# Patient Record
Sex: Female | Born: 1940 | Race: Black or African American | Hispanic: No | Smoking: Former smoker
Health system: Southern US, Community
[De-identification: ages and names within clinical notes are randomized; demographics above are authoritative.]

## PROBLEM LIST (undated history)

## (undated) DIAGNOSIS — T7840XA Allergy, unspecified, initial encounter: Secondary | ICD-10-CM

## (undated) DIAGNOSIS — R0600 Dyspnea, unspecified: Secondary | ICD-10-CM

## (undated) DIAGNOSIS — J449 Chronic obstructive pulmonary disease, unspecified: Secondary | ICD-10-CM

## (undated) DIAGNOSIS — G4730 Sleep apnea, unspecified: Secondary | ICD-10-CM

## (undated) DIAGNOSIS — M1990 Unspecified osteoarthritis, unspecified site: Secondary | ICD-10-CM

## (undated) DIAGNOSIS — I779 Disorder of arteries and arterioles, unspecified: Secondary | ICD-10-CM

## (undated) DIAGNOSIS — S4990XA Unspecified injury of shoulder and upper arm, unspecified arm, initial encounter: Secondary | ICD-10-CM

## (undated) DIAGNOSIS — K219 Gastro-esophageal reflux disease without esophagitis: Secondary | ICD-10-CM

## (undated) DIAGNOSIS — E785 Hyperlipidemia, unspecified: Secondary | ICD-10-CM

## (undated) DIAGNOSIS — J45909 Unspecified asthma, uncomplicated: Secondary | ICD-10-CM

## (undated) DIAGNOSIS — Z9889 Other specified postprocedural states: Secondary | ICD-10-CM

## (undated) DIAGNOSIS — K449 Diaphragmatic hernia without obstruction or gangrene: Secondary | ICD-10-CM

## (undated) DIAGNOSIS — K222 Esophageal obstruction: Secondary | ICD-10-CM

## (undated) DIAGNOSIS — I219 Acute myocardial infarction, unspecified: Secondary | ICD-10-CM

## (undated) DIAGNOSIS — R519 Headache, unspecified: Secondary | ICD-10-CM

## (undated) DIAGNOSIS — D126 Benign neoplasm of colon, unspecified: Secondary | ICD-10-CM

## (undated) DIAGNOSIS — I10 Essential (primary) hypertension: Secondary | ICD-10-CM

## (undated) DIAGNOSIS — R112 Nausea with vomiting, unspecified: Secondary | ICD-10-CM

## (undated) DIAGNOSIS — I2510 Atherosclerotic heart disease of native coronary artery without angina pectoris: Secondary | ICD-10-CM

## (undated) DIAGNOSIS — F419 Anxiety disorder, unspecified: Secondary | ICD-10-CM

## (undated) DIAGNOSIS — I5032 Chronic diastolic (congestive) heart failure: Secondary | ICD-10-CM

## (undated) HISTORY — DX: Unspecified asthma, uncomplicated: J45.909

## (undated) HISTORY — PX: ABDOMINAL HYSTERECTOMY: SHX81

## (undated) HISTORY — DX: Chronic obstructive pulmonary disease, unspecified: J44.9

## (undated) HISTORY — PX: ROTATOR CUFF REPAIR: SHX139

## (undated) HISTORY — DX: Hyperlipidemia, unspecified: E78.5

## (undated) HISTORY — DX: Esophageal obstruction: K22.2

## (undated) HISTORY — PX: OTHER SURGICAL HISTORY: SHX169

## (undated) HISTORY — DX: Diaphragmatic hernia without obstruction or gangrene: K44.9

## (undated) HISTORY — DX: Acute myocardial infarction, unspecified: I21.9

## (undated) HISTORY — PX: ESOPHAGOGASTRODUODENOSCOPY: SHX1529

## (undated) HISTORY — PX: CORONARY ARTERY BYPASS GRAFT: SHX141

## (undated) HISTORY — DX: Essential (primary) hypertension: I10

## (undated) HISTORY — PX: COLONOSCOPY: SHX174

## (undated) HISTORY — DX: Disorder of arteries and arterioles, unspecified: I77.9

## (undated) HISTORY — PX: UPPER GASTROINTESTINAL ENDOSCOPY: SHX188

## (undated) HISTORY — DX: Benign neoplasm of colon, unspecified: D12.6

## (undated) HISTORY — PX: EYE SURGERY: SHX253

## (undated) HISTORY — PX: TONSILLECTOMY: SUR1361

## (undated) HISTORY — DX: Unspecified injury of shoulder and upper arm, unspecified arm, initial encounter: S49.90XA

## (undated) HISTORY — DX: Sleep apnea, unspecified: G47.30

## (undated) HISTORY — DX: Atherosclerotic heart disease of native coronary artery without angina pectoris: I25.10

## (undated) HISTORY — PX: POLYPECTOMY: SHX149

## (undated) HISTORY — PX: APPENDECTOMY: SHX54

## (undated) HISTORY — DX: Chronic diastolic (congestive) heart failure: I50.32

## (undated) HISTORY — DX: Allergy, unspecified, initial encounter: T78.40XA

---

## 1998-01-21 ENCOUNTER — Encounter: Admission: RE | Admit: 1998-01-21 | Discharge: 1998-04-21 | Payer: Self-pay | Admitting: Internal Medicine

## 1998-03-06 ENCOUNTER — Encounter: Payer: Self-pay | Admitting: Orthopedic Surgery

## 1998-03-11 ENCOUNTER — Observation Stay (HOSPITAL_COMMUNITY): Admission: RE | Admit: 1998-03-11 | Discharge: 1998-03-12 | Payer: Self-pay | Admitting: Orthopedic Surgery

## 1998-08-19 ENCOUNTER — Ambulatory Visit (HOSPITAL_COMMUNITY): Admission: RE | Admit: 1998-08-19 | Discharge: 1998-08-19 | Payer: Self-pay | Admitting: Gastroenterology

## 1998-08-19 ENCOUNTER — Encounter: Payer: Self-pay | Admitting: Gastroenterology

## 1998-10-06 ENCOUNTER — Encounter: Payer: Self-pay | Admitting: Internal Medicine

## 1998-10-07 ENCOUNTER — Inpatient Hospital Stay (HOSPITAL_COMMUNITY): Admission: AD | Admit: 1998-10-07 | Discharge: 1998-10-11 | Payer: Self-pay | Admitting: Internal Medicine

## 1998-10-10 ENCOUNTER — Encounter: Payer: Self-pay | Admitting: Cardiology

## 1999-01-11 ENCOUNTER — Emergency Department (HOSPITAL_COMMUNITY): Admission: EM | Admit: 1999-01-11 | Discharge: 1999-01-11 | Payer: Self-pay | Admitting: Emergency Medicine

## 1999-01-11 ENCOUNTER — Encounter: Payer: Self-pay | Admitting: Emergency Medicine

## 2000-04-12 ENCOUNTER — Encounter: Admission: RE | Admit: 2000-04-12 | Discharge: 2000-04-12 | Payer: Self-pay | Admitting: Gastroenterology

## 2000-04-12 ENCOUNTER — Encounter: Payer: Self-pay | Admitting: Gastroenterology

## 2000-05-04 ENCOUNTER — Ambulatory Visit (HOSPITAL_COMMUNITY): Admission: RE | Admit: 2000-05-04 | Discharge: 2000-05-04 | Payer: Self-pay | Admitting: Gastroenterology

## 2000-05-04 ENCOUNTER — Encounter (INDEPENDENT_AMBULATORY_CARE_PROVIDER_SITE_OTHER): Payer: Self-pay | Admitting: Specialist

## 2000-07-21 ENCOUNTER — Other Ambulatory Visit: Admission: RE | Admit: 2000-07-21 | Discharge: 2000-07-21 | Payer: Self-pay | Admitting: *Deleted

## 2000-08-15 ENCOUNTER — Encounter: Admission: RE | Admit: 2000-08-15 | Discharge: 2000-08-15 | Payer: Self-pay | Admitting: Gastroenterology

## 2000-08-15 ENCOUNTER — Encounter: Payer: Self-pay | Admitting: Gastroenterology

## 2001-01-11 ENCOUNTER — Encounter: Payer: Self-pay | Admitting: Gastroenterology

## 2001-01-11 ENCOUNTER — Encounter: Admission: RE | Admit: 2001-01-11 | Discharge: 2001-01-11 | Payer: Self-pay | Admitting: Gastroenterology

## 2001-03-06 ENCOUNTER — Encounter (INDEPENDENT_AMBULATORY_CARE_PROVIDER_SITE_OTHER): Payer: Self-pay | Admitting: *Deleted

## 2001-03-06 ENCOUNTER — Ambulatory Visit (HOSPITAL_COMMUNITY): Admission: RE | Admit: 2001-03-06 | Discharge: 2001-03-06 | Payer: Self-pay | Admitting: Gastroenterology

## 2001-03-08 ENCOUNTER — Encounter: Payer: Self-pay | Admitting: Urology

## 2001-03-08 ENCOUNTER — Ambulatory Visit: Admission: RE | Admit: 2001-03-08 | Discharge: 2001-03-08 | Payer: Self-pay | Admitting: Urology

## 2001-03-29 ENCOUNTER — Encounter (INDEPENDENT_AMBULATORY_CARE_PROVIDER_SITE_OTHER): Payer: Self-pay | Admitting: Specialist

## 2001-03-29 ENCOUNTER — Inpatient Hospital Stay (HOSPITAL_COMMUNITY): Admission: RE | Admit: 2001-03-29 | Discharge: 2001-04-02 | Payer: Self-pay | Admitting: Urology

## 2002-03-13 ENCOUNTER — Ambulatory Visit (HOSPITAL_BASED_OUTPATIENT_CLINIC_OR_DEPARTMENT_OTHER): Admission: RE | Admit: 2002-03-13 | Discharge: 2002-03-13 | Payer: Self-pay | Admitting: Oral Surgery

## 2003-08-03 ENCOUNTER — Inpatient Hospital Stay (HOSPITAL_COMMUNITY): Admission: RE | Admit: 2003-08-03 | Discharge: 2003-08-04 | Payer: Self-pay | Admitting: Orthopedic Surgery

## 2004-05-28 ENCOUNTER — Emergency Department (HOSPITAL_COMMUNITY): Admission: EM | Admit: 2004-05-28 | Discharge: 2004-05-28 | Payer: Self-pay | Admitting: Emergency Medicine

## 2004-08-12 ENCOUNTER — Encounter: Admission: RE | Admit: 2004-08-12 | Discharge: 2004-08-12 | Payer: Self-pay | Admitting: Internal Medicine

## 2005-07-21 ENCOUNTER — Encounter: Admission: RE | Admit: 2005-07-21 | Discharge: 2005-07-21 | Payer: Self-pay | Admitting: Orthopedic Surgery

## 2005-08-06 ENCOUNTER — Encounter: Admission: RE | Admit: 2005-08-06 | Discharge: 2005-08-06 | Payer: Self-pay | Admitting: Orthopedic Surgery

## 2006-03-21 ENCOUNTER — Ambulatory Visit (HOSPITAL_COMMUNITY): Admission: RE | Admit: 2006-03-21 | Discharge: 2006-03-21 | Payer: Self-pay | Admitting: Neurosurgery

## 2006-06-01 ENCOUNTER — Encounter: Admission: RE | Admit: 2006-06-01 | Discharge: 2006-06-01 | Payer: Self-pay | Admitting: Orthopedic Surgery

## 2006-08-19 ENCOUNTER — Ambulatory Visit: Payer: Self-pay | Admitting: Vascular Surgery

## 2007-02-09 DIAGNOSIS — I219 Acute myocardial infarction, unspecified: Secondary | ICD-10-CM

## 2007-02-09 DIAGNOSIS — I252 Old myocardial infarction: Secondary | ICD-10-CM

## 2007-02-09 HISTORY — DX: Acute myocardial infarction, unspecified: I21.9

## 2007-02-09 HISTORY — DX: Old myocardial infarction: I25.2

## 2007-03-01 ENCOUNTER — Inpatient Hospital Stay (HOSPITAL_COMMUNITY): Admission: EM | Admit: 2007-03-01 | Discharge: 2007-03-12 | Payer: Self-pay | Admitting: Emergency Medicine

## 2007-03-01 HISTORY — PX: CARDIAC CATHETERIZATION: SHX172

## 2007-03-02 ENCOUNTER — Encounter: Payer: Self-pay | Admitting: Surgery

## 2007-03-03 ENCOUNTER — Ambulatory Visit: Payer: Self-pay | Admitting: Vascular Surgery

## 2007-03-03 ENCOUNTER — Encounter: Payer: Self-pay | Admitting: Surgery

## 2007-03-06 ENCOUNTER — Ambulatory Visit: Payer: Self-pay | Admitting: Surgery

## 2007-03-07 ENCOUNTER — Encounter: Payer: Self-pay | Admitting: Surgery

## 2007-03-28 ENCOUNTER — Ambulatory Visit: Payer: Self-pay | Admitting: Surgery

## 2007-03-28 ENCOUNTER — Encounter: Admission: RE | Admit: 2007-03-28 | Discharge: 2007-03-28 | Payer: Self-pay | Admitting: Surgery

## 2007-04-03 ENCOUNTER — Encounter (HOSPITAL_COMMUNITY): Admission: RE | Admit: 2007-04-03 | Discharge: 2007-07-02 | Payer: Self-pay | Admitting: Cardiovascular Disease

## 2007-07-03 ENCOUNTER — Encounter (HOSPITAL_COMMUNITY): Admission: RE | Admit: 2007-07-03 | Discharge: 2007-08-09 | Payer: Self-pay | Admitting: Cardiovascular Disease

## 2007-11-15 HISTORY — PX: CARDIOVASCULAR STRESS TEST: SHX262

## 2008-03-08 HISTORY — PX: US ECHOCARDIOGRAPHY: HXRAD669

## 2008-10-11 ENCOUNTER — Ambulatory Visit (HOSPITAL_COMMUNITY): Admission: RE | Admit: 2008-10-11 | Discharge: 2008-10-11 | Payer: Self-pay | Admitting: Cardiovascular Disease

## 2008-10-11 ENCOUNTER — Inpatient Hospital Stay (HOSPITAL_BASED_OUTPATIENT_CLINIC_OR_DEPARTMENT_OTHER): Admission: RE | Admit: 2008-10-11 | Discharge: 2008-10-11 | Payer: Self-pay | Admitting: Cardiovascular Disease

## 2008-10-11 HISTORY — PX: CARDIAC CATHETERIZATION: SHX172

## 2009-07-23 ENCOUNTER — Ambulatory Visit (HOSPITAL_COMMUNITY): Admission: RE | Admit: 2009-07-23 | Discharge: 2009-07-23 | Payer: Self-pay | Admitting: Cardiovascular Disease

## 2009-07-23 HISTORY — PX: CARDIAC CATHETERIZATION: SHX172

## 2009-08-08 ENCOUNTER — Encounter: Admission: RE | Admit: 2009-08-08 | Discharge: 2009-08-08 | Payer: Self-pay | Admitting: Internal Medicine

## 2009-11-18 DIAGNOSIS — E119 Type 2 diabetes mellitus without complications: Secondary | ICD-10-CM | POA: Insufficient documentation

## 2009-11-18 DIAGNOSIS — E785 Hyperlipidemia, unspecified: Secondary | ICD-10-CM | POA: Insufficient documentation

## 2009-11-19 ENCOUNTER — Ambulatory Visit: Payer: Self-pay | Admitting: Critical Care Medicine

## 2009-11-19 DIAGNOSIS — I252 Old myocardial infarction: Secondary | ICD-10-CM | POA: Insufficient documentation

## 2009-11-19 DIAGNOSIS — J45909 Unspecified asthma, uncomplicated: Secondary | ICD-10-CM | POA: Insufficient documentation

## 2009-11-19 DIAGNOSIS — R05 Cough: Secondary | ICD-10-CM

## 2009-11-19 DIAGNOSIS — K219 Gastro-esophageal reflux disease without esophagitis: Secondary | ICD-10-CM | POA: Insufficient documentation

## 2009-11-19 DIAGNOSIS — R059 Cough, unspecified: Secondary | ICD-10-CM | POA: Insufficient documentation

## 2009-11-20 ENCOUNTER — Encounter: Payer: Self-pay | Admitting: Critical Care Medicine

## 2009-12-29 ENCOUNTER — Ambulatory Visit: Payer: Self-pay | Admitting: Critical Care Medicine

## 2009-12-29 DIAGNOSIS — R252 Cramp and spasm: Secondary | ICD-10-CM | POA: Insufficient documentation

## 2009-12-30 ENCOUNTER — Telehealth: Payer: Self-pay | Admitting: Critical Care Medicine

## 2009-12-30 LAB — CONVERTED CEMR LAB
CO2: 30 meq/L (ref 19–32)
Calcium: 9 mg/dL (ref 8.4–10.5)
Creatinine, Ser: 1 mg/dL (ref 0.4–1.2)
Sodium: 139 meq/L (ref 135–145)

## 2010-02-25 ENCOUNTER — Ambulatory Visit: Payer: Self-pay | Admitting: Cardiovascular Disease

## 2010-03-01 ENCOUNTER — Encounter: Payer: Self-pay | Admitting: Neurosurgery

## 2010-03-12 NOTE — Assessment & Plan Note (Signed)
Summary: Pulmonary OV   Copy to:  Dr. Andi Devon Primary Oral Remache/Referring Kaiana Marion:  Dr. Andi Devon  CC:  1 month follow up.  Breathing unchanged.  Cough has improved but still has a nonprod cough..  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  Dyspnea occures with rest and exertion. This came on insideously and now is  getting progressively worse.  Pt notes walking from car to parking lot is a struggle, worse up an incline.  Pt will notes some dyspnea when awaken from sleep Pt notes some coughing this week withdx of  bronchitis  with  mucus white to sl yellow Pt rx with advair and proair and ?s if helps   There is no real chest pain, did starte with chest pain and saw cardiology and tests negNovember 21, 2011 4:42 PM at last ov ? cyclic cough we Rec: Use Cyclic cough protocol with tramadol and benzonatate Stop advair Stop lisinopril Start Losartan one daily Start omeprazole one daily Start Qvar two puff twice daily  now:  cough is better vs before.  Still present, not as severe.  always dry cough.  nauseated with omeprazole cough pills helped esp at night. The pt still using strong perfume   Current Medications (verified): 1)  Robaxin 500 Mg Tabs (Methocarbamol) .... Take 1 Tablet By Mouth Two Times A Day As Needed 2)  Qvar 80 Mcg/act Aers (Beclomethasone Dipropionate) .... Two Puff Twice Daily 3)  Duoneb 0.5-2.5 (3) Mg/77ml Soln (Ipratropium-Albuterol) .... Four Times Daily 4)  Amlodipine Besylate 5 Mg Tabs (Amlodipine Besylate) .Marland Kitchen.. 1 Once Daily 5)  Metformin Hcl 500 Mg Tabs (Metformin Hcl) .Marland Kitchen.. 1 Two Times A Day 6)  Xanax 0.5 Mg Tabs (Alprazolam) .... Take 1 Tablet By Mouth Two Times A Day 7)  Losartan Potassium 100 Mg Tabs (Losartan Potassium) .... One By Mouth Daily 8)  Crestor 20 Mg Tabs (Rosuvastatin Calcium) .... Take One Tablet By Mouth Every Other Day 9)  Aspirin  81 Mg Tbec (Aspirin) .Marland Kitchen.. 1 Once Daily 10)  Humalog Mix 75/25 75-25 % Susp (Insulin Lispro Prot & Lispro) .... 20 Units in Am and 40 Units in Pm 11)  Benzonatate 100 Mg Caps (Benzonatate) .... One To Two Every 4-6 Hours As Needed Cough 12)  Bystolic 10 Mg Tabs (Nebivolol Hcl) .... Take 1 Tablet By Mouth Once A Day 13)  Doxazosin Mesylate 4 Mg Tabs (Doxazosin Mesylate) .... 1/2 Tablet Two Times A Day 14)  Potassium 99 Mg Tabs (Potassium) .... Take 1 Tablet By Mouth Once A Day 15)  Vitamin D3 1000 Unit Caps (Cholecalciferol) .... Take 1 Capsule By Mouth Once A Day 16)  Omeprazole 20 Mg  Cpdr (Omeprazole) .... By Mouth Daily. Take One Half Hour Before Eating. 17)  Tramadol Hcl 50 Mg  Tabs (Tramadol Hcl) .... One To Two By Mouth Every 4-6 Hours As Needed Cough 18)  Aerochamber Plus  Misc (Spacer/aero-Holding Chambers) .... Use With Qvar  Allergies (verified): 1)  ! Sulfa 2)  ! Codeine 3)  ! Morphine  Past History:  Past medical, surgical, family and social histories (including risk factors) reviewed, and no changes noted (except as noted below).  Past Medical History: Reviewed history from 11/19/2009 and no changes required. HEART ATTACK (ICD-410.90) ASTHMA (ICD-493.90) HYPERLIPIDEMIA (ICD-272.4) DIABETES, TYPE 2 (ICD-250.00)  Past Surgical History: Reviewed history from 11/19/2009 and no changes required. 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 emphysema - paternal uncle heart disase - MGF esophagus cancer - brother  Social History: Reviewed history from 11/19/2009 and no changes required. Quit smoking in 1981.  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  The patient denies shortness of breath at  rest, productive cough, coughing up blood, chest pain, irregular heartbeats, acid heartburn, indigestion, loss of appetite, weight change, abdominal pain, difficulty swallowing, sore throat, tooth/dental problems, headaches, nasal congestion/difficulty breathing through nose, sneezing, itching, ear ache, anxiety, depression, hand/feet swelling, joint stiffness or pain, rash, change in color of mucus, and fever.    Vital Signs:  Patient profile:   70 year old female Height:      64 inches Weight:      230.13 pounds BMI:     39.64 O2 Sat:      91 % on Room air Temp:     97.7 degrees F oral Pulse rate:   77 / minute BP sitting:   104 / 64  (right arm) Cuff size:   large  Vitals Entered By: Gweneth Dimitri RN (December 29, 2009 4:13 PM)  Nutrition Counseling: Patient's BMI is greater than 25 and therefore counseled on weight management options.  O2 Flow:  Room air CC: 1 month follow up.  Breathing unchanged.  Cough has improved but still has a nonprod cough. Comments Medications reviewed with patient Daytime contact number verified with patient. Gweneth Dimitri RN  December 29, 2009 4:13 PM    Physical Exam  Additional Exam:  Gen: Pleasant, well-nourished, in no distress,  normal affect ENT: No lesions,  mouth clear,  oropharynx clear, no postnasal drip Neck: No JVD, no TMG, no carotid bruits Lungs: No use of accessory muscles, no dullness to percussion, distant BS, prominent pseudowheeze Cardiovascular: RRR, heart sounds normal, no murmur or gallops, no peripheral edema Abdomen: soft and NT, no HSM,  BS normal Musculoskeletal: No deformities, no cyanosis or clubbing Neuro: alert, non focal Skin: Warm, no lesions or rashes    Impression & Recommendations:  Problem # 1:  COUGH (ICD-786.2) Assessment Improved  Orders: Est. Patient Level III (69629)  Cyclical cough with upper airway instability, GERD, probable lower airway inflammation as well.  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 Four times daily 2)  Duoneb 0.5-2.5 (3) Mg/65ml Soln (Ipratropium-albuterol) .... Four times daily  as needed 3)  Famotidine 20 Mg Tabs (Famotidine) .... One by mouth once daily  Complete Medication List: 1)  Robaxin 500 Mg Tabs (Methocarbamol) .... Take 1 tablet by mouth two times a day as needed 2)  Qvar 80 Mcg/act Aers (Beclomethasone dipropionate) .... Two puff twice daily 3)  Duoneb 0.5-2.5 (3) Mg/57ml Soln (Ipratropium-albuterol) .... Four times daily  as needed 4)  Amlodipine Besylate 5 Mg Tabs (Amlodipine besylate) .Marland Kitchen.. 1 once daily 5)  Metformin Hcl 500 Mg Tabs (Metformin hcl) .Marland Kitchen.. 1 two times a day 6)  Xanax 0.5 Mg Tabs (Alprazolam) .... Take 1 tablet  by mouth two times a day 7)  Losartan Potassium 100 Mg Tabs (Losartan potassium) .... One by mouth daily 8)  Crestor 20 Mg Tabs (Rosuvastatin calcium) .... Take one tablet by mouth every other day 9)  Aspirin 81 Mg Tbec (Aspirin) .Marland Kitchen.. 1 once daily 10)  Humalog Mix 75/25 75-25 % Susp (Insulin lispro prot & lispro) .... 20 units in am and 40 units in pm 11)  Benzonatate 100 Mg Caps (Benzonatate) .... One to two every 4-6 hours as needed cough 12)  Bystolic 10 Mg Tabs (Nebivolol hcl) .... Take 1 tablet by mouth once a day 13)  Doxazosin Mesylate 4 Mg Tabs (Doxazosin mesylate) .... 1/2 tablet two times a day 14)  Potassium 99 Mg Tabs (Potassium) .... Take 1 tablet by mouth once a day 15)  Vitamin D3 1000 Unit Caps (Cholecalciferol) .... Take 1 capsule by mouth once a day 16)  Famotidine 20 Mg Tabs (Famotidine) .... One by mouth once daily 17)  Tramadol Hcl 50 Mg Tabs (Tramadol hcl) .... One to two by mouth every 4-6 hours as needed cough 18)  Aerochamber Plus  Misc (Spacer/aero-holding chambers) .... Use with qvar  Other Orders: TLB-Calcium (82310-CA) TLB-BMP (Basic Metabolic Panel-BMET) (80048-METABOL) TLB-Phosphorus (84100-PHOS) TLB-Magnesium (Mg) (83735-MG)  Patient Instructions: 1)  Stop omeprazole 2)  Start famotidine daily for stomach 3)  Try to reduce the amount of perfume you are using 4)  No change in Qvar 5)  Use tramadol or benzonatate as needed for cough 6)  Follow reflux diet 7)  Labs today to check for cause of cramping 8)  Return 3 months Prescriptions: FAMOTIDINE 20 MG TABS (FAMOTIDINE) one by mouth once daily  #30 x 6   Entered and Authorized by:   Storm Frisk MD   Signed by:   Storm Frisk MD on 12/29/2009   Method used:   Electronically to        [facility]. [phone]* (retail)       [address] [place]  [address]       Ph: [phone] or [phone]       Fax: [phone]   RxID:   [id number]     Appended Document: Pulmonary OV fax Kellie Shropshire

## 2010-03-12 NOTE — Progress Notes (Signed)
Summary: Labs normal  Phone Note Outgoing Call   Reason for Call: Discuss lab or test results Summary of Call: call the pt and tell her all labs are normal, i am unclear why she is cramping, she should confer with her primary care MD as this is not related to her cough or her pulm issues Initial call taken by: Storm Frisk MD,  December 30, 2009 6:06 AM  Follow-up for Phone Call        Called, spoke with pt.  She was informed of above results and recs per PW and verbalized understanding.   Follow-up by: Gweneth Dimitri RN,  December 30, 2009 8:49 AM

## 2010-03-12 NOTE — Assessment & Plan Note (Signed)
Summary: Pulmonary Consultation   Copy to:  Dr. Andi Devon Primary Claudie Brickhouse/Referring Katiana Ruland:  Dr. Andi Devon  CC:  Pulmonary Consult for SOB.   and dyspnea.  History of Present Illness: Pulmonary Consultation       This is a 70 year old female who presents with dyspnea.  The patient complains of shortness of breath, chest tightness, wheezing, cough, mucous production, exercise induced symptoms, and congestion, but denies history of diagnosed COPD, chest pain worse with breathing and coughing, and nocturnal awakening.  The cough is described as productive of clear sputum, perceived to be from lower airway, not associated with chest pain, interrupts sleep, worse with cold air, occurs after eating, worse with exercise, worse with exposure to inhalants:, worse in supine position, worse with stress/anxiety, and with wheezing noted.  The dyspnea is described as dyspnea at rest, dyspnea with exertion, occurs while bending over, and associated with wheezing.  The patient reports a history of asthma, sleep disordered breathing, obstructive sleep apnea, and heart disease.  The patient denies any history of allergic rhinitis, COPD, thyroid disease, anemia, collagen vascular disease, osteporosis, kyphoscoliosis, occupational exposure: , and cancer: .  Prior effective therapies include beta agonists and oral inhaled steroids.    The pt has had dyspnea for one year.  Dyspnea occures with rest and exertion. This came on insideously and now is  getting progressively worse.  Pt notes walking from car to parking lot is a struggle, worse up an incline.  Pt will notes some dyspnea when awaken from sleep Pt notes some coughing this week withdx of  bronchitis  with  mucus white to sl yellow Pt rx with advair and proair and ?s if helps   There is no real chest pain, did starte with chest pain and saw cardiology and tests neg  Preventive Screening-Counseling & Management  Alcohol-Tobacco     Smoking  Status: quit > 6 months     Year Started: 1985     Pack years: 7  Current Medications (verified): 1)  Robaxin 500 Mg Tabs (Methocarbamol) .... Take 1 Tablet By Mouth Two Times A Day As Needed 2)  Advair Diskus 500-50 Mcg/dose Aepb (Fluticasone-Salmeterol) .... Inhale 1 Puff Two Times A Day 3)  Duoneb 0.5-2.5 (3) Mg/33ml Soln (Ipratropium-Albuterol) .... Use As Directed 4)  Amlodipine Besylate 5 Mg Tabs (Amlodipine Besylate) .Marland Kitchen.. 1 Once Daily 5)  Metformin Hcl 500 Mg Tabs (Metformin Hcl) .Marland Kitchen.. 1 Two Times A Day 6)  Xanax 0.5 Mg Tabs (Alprazolam) .... Take 1 Tablet By Mouth Two Times A Day 7)  Lisinopril 20 Mg Tabs (Lisinopril) .... 2 Once Daily 8)  Crestor 20 Mg Tabs (Rosuvastatin Calcium) .... Take One Tablet By Mouth Every Other Day 9)  Aspirin 81 Mg Tbec (Aspirin) .Marland Kitchen.. 1 Once Daily 10)  Humalog Mix 75/25 75-25 % Susp (Insulin Lispro Prot & Lispro) .... 20 Units in Am and 40 Units in Pm 11)  Benzonatate 100 Mg Caps (Benzonatate) .... As Needed 12)  Bystolic 10 Mg Tabs (Nebivolol Hcl) .... Take 1 Tablet By Mouth Once A Day 13)  Doxazosin Mesylate 4 Mg Tabs (Doxazosin Mesylate) .... 1/2 Tablet Two Times A Day 14)  Potassium 99 Mg Tabs (Potassium) .... Take 1 Tablet By Mouth Once A Day 15)  Vitamin D3 1000 Unit Caps (Cholecalciferol) .... Take 1 Capsule By Mouth Once A Day  Allergies: 1)  ! Sulfa 2)  ! Codeine 3)  ! Morphine  Past History:  Past medical, surgical, family and  social histories (including risk factors) reviewed, and no changes noted (except as noted below).  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 emphysema - paternal uncle heart disase - MGF esophagus cancer - brother  Social History: Reviewed  history and no changes required. Quit smoking in 1981.  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  The patient denies coughing up blood, chest pain, irregular heartbeats, acid heartburn, indigestion, loss of appetite, weight change, abdominal pain, sore throat, tooth/dental problems, headaches, sneezing, itching, ear ache, anxiety, depression, joint stiffness or pain, rash, and fever.         Notes hoarseness      See HPI for Pulmonary, Cardiac, ENT, and General review of systems.  Vital Signs:  Patient profile:   70 year old female Height:      64 inches Weight:      228.13 pounds BMI:     39.30 O2 Sat:      95 % on Room air Temp:     98.2 degrees F oral Pulse rate:   81 / minute BP sitting:   100 / 64  (left arm) Cuff size:   large  Vitals Entered By: Gweneth Dimitri RN (November 19, 2009 2:29 PM)  O2 Flow:  Room air CC: Pulmonary Consult for SOB.  , dyspnea Comments Medications reviewed with patient Daytime contact number verified with patient. Gweneth Dimitri RN  November 19, 2009 2:30 PM    Physical Exam  Additional Exam:  Gen: Pleasant, well-nourished, in no distress,  normal affect ENT: No lesions,  mouth clear,  oropharynx clear, no postnasal drip Neck: No JVD, no TMG, no carotid bruits Lungs: No use of accessory muscles, no dullness to percussion, distant BS, prominent pseudowheeze Cardiovascular: RRR, heart sounds normal, no murmur or gallops, no peripheral edema Abdomen: soft and NT, no HSM,  BS normal Musculoskeletal: No deformities, no cyanosis or clubbing Neuro: alert, non focal Skin: Warm, no lesions or  rashes    Impression & Recommendations:  Problem # 1:  COUGH (ICD-786.2) Assessment Deteriorated Cyclical cough with upper airway instability, GERD, probable lower airway inflammation as well.  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 Take 1 tablet by mouth two times a day as needed 2)  Advair Diskus 500-50 Mcg/dose Aepb (Fluticasone-salmeterol) .... Inhale 1 puff two times a day 3)  Qvar 80 Mcg/act Aers (Beclomethasone dipropionate) .... Two puff twice daily 4)  Xanax 0.5 Mg Tabs (Alprazolam) .... Take 1 tablet by mouth two times a day 5)  Lisinopril 20 Mg Tabs (Lisinopril) .... 2 once daily 6)  Losartan Potassium 100 Mg Tabs (Losartan potassium) .... One by mouth daily 7)  Crestor 20 Mg Tabs (  Rosuvastatin calcium) .... Take one tablet by mouth every other day 8)  Humalog Mix 75/25 75-25 % Susp (Insulin lispro prot & lispro) .... 20 units in am and 40 units in pm 9)  Benzonatate 100 Mg Caps (Benzonatate) .... As needed 10)  Benzonatate 100 Mg Caps (Benzonatate) .... One to two every 4-6 hours as needed cough 11)  Bystolic 10 Mg Tabs (Nebivolol hcl) .... Take 1 tablet by mouth once a day 12)  Doxazosin Mesylate 4 Mg Tabs (Doxazosin mesylate) .... 1/2 tablet two times a day 13)  Potassium 99 Mg Tabs (Potassium) .... Take 1 tablet by mouth once a day 14)  Vitamin D3 1000 Unit Caps (Cholecalciferol) .... Take 1 capsule by mouth once a day 15)  Omeprazole 20 Mg Cpdr (Omeprazole) .... By mouth daily. take one half hour before eating. 16)  Tussicaps 10-8 Mg Xr12h-cap (Hydrocod polst-chlorphen polst) .... One by mouth two times a day as needed cough 17)  Tramadol Hcl 50 Mg Tabs (Tramadol hcl) .... One to two by mouth every  4-6 hours as needed cough 18)  Aerochamber Plus Misc (Spacer/aero-holding chambers) .... Use with qvar  Complete Medication List: 1)  Robaxin 500 Mg Tabs (Methocarbamol) .... Take 1 tablet by mouth two times a day as needed 2)  Qvar 80 Mcg/act Aers (Beclomethasone dipropionate) .... Two puff twice daily 3)  Duoneb 0.5-2.5 (3) Mg/52ml Soln (Ipratropium-albuterol) .... Use as directed 4)  Amlodipine Besylate 5 Mg Tabs (Amlodipine besylate) .Marland Kitchen.. 1 once daily 5)  Metformin Hcl 500 Mg Tabs (Metformin hcl) .Marland Kitchen.. 1 two times a day 6)  Xanax 0.5 Mg Tabs (Alprazolam) .... Take 1 tablet by mouth two times a day 7)  Losartan Potassium 100 Mg Tabs (Losartan potassium) .... One by mouth daily 8)  Crestor 20 Mg Tabs (Rosuvastatin calcium) .... Take one tablet by mouth every other day 9)  Aspirin 81 Mg Tbec (Aspirin) .Marland Kitchen.. 1 once daily 10)  Humalog Mix 75/25 75-25 % Susp (Insulin lispro prot & lispro) .... 20 units in am and 40 units in pm 11)  Benzonatate 100 Mg Caps (Benzonatate) .... One to two every 4-6 hours as needed cough 12)  Bystolic 10 Mg Tabs (Nebivolol hcl) .... Take 1 tablet by mouth once a day 13)  Doxazosin Mesylate 4 Mg Tabs (Doxazosin mesylate) .... 1/2 tablet two times a day 14)  Potassium 99 Mg Tabs (Potassium) .... Take 1 tablet by mouth once a day 15)  Vitamin D3 1000 Unit Caps (Cholecalciferol) .... Take 1 capsule by mouth once a day 16)  Omeprazole 20 Mg Cpdr (Omeprazole) .... By mouth daily. take one half hour before eating. 17)  Tramadol Hcl 50 Mg Tabs (Tramadol hcl) .... One to two by mouth every 4-6 hours as needed cough 18)  Aerochamber Plus Misc (Spacer/aero-holding chambers) .... Use with qvar  Patient Instructions: 1)  Use Cyclic cough protocol with tramadol and benzonatate 2)  Stop advair 3)  Stop lisinopril 4)  Start Losartan one daily 5)  Start omeprazole one daily 6)  Start Qvar two puff twice daily 7)  Use aerochamber with Qvar 8)  Return one month   Prescriptions: AEROCHAMBER PLUS  MISC (SPACER/AERO-HOLDING CHAMBERS) Use with Qvar  #1 x 0   Entered and Authorized by:   Storm Frisk MD   Signed by:   Storm Frisk MD on 11/19/2009   Method used:   Print then Give to Patient   RxID:   [id number] TRAMADOL HCL 50 MG  TABS (TRAMADOL HCL) One to two by mouth every 4-6 hours as needed cough  #40 x 1   Entered and Authorized by:   Storm Frisk MD   Signed by:   Storm Frisk MD on 11/19/2009   Method used:   Print then Give to Patient   RxID:   [id number] BENZONATATE 100 MG CAPS (BENZONATATE) One to two every 4-6 hours as needed cough  #90 x 6   Entered and Authorized by:   Storm Frisk MD   Signed by:   Storm Frisk MD on 11/19/2009   Method used:   Print then Give to Patient   RxID:   [id number] TUSSICAPS 10-8 MG XR12H-CAP (HYDROCOD POLST-CHLORPHEN POLST) One by mouth two times a day as needed cough  #10 x 0   Entered and Authorized by:   Storm Frisk MD   Signed by:   Storm Frisk MD on 11/19/2009   Method used:   Print then Give to Patient   RxID:   [id number] OMEPRAZOLE 20 MG  CPDR (OMEPRAZOLE) By mouth daily. Take one half hour before eating.  #30 x 0   Entered and Authorized by:   Storm Frisk MD   Signed by:   Storm Frisk MD on 11/19/2009   Method used:   [facility]        [facility] [phone][facility] (retail)       [address], [place]  [address]       Ph: [phone] or [phone]       Fax: [phone]   RxID:   [id number] DDUK 02 MCG/ACT AERS (BECLOMETHASONE DIPROPIONATE) Two puff twice daily  #1 x 6   Entered and Authorized by:   Storm Frisk MD   Signed by:   Storm Frisk MD on 11/19/2009   Method used:   Print then Give to Patient   RxID:   [id number] VOHYWVPX POTASSIUM 100 MG TABS (LOSARTAN POTASSIUM) One by mouth daily  #30 x 6   Entered and Authorized by:   Storm Frisk MD   Signed by:   Storm Frisk MD  on 11/19/2009   Method used:   [facility] to        [facility] [phone]* (retail)       [address], [place]  [address]       Ph: [phone] or [phone]       Fax: [phone]   RxID:   [id number]    Immunization History:  Influenza Immunization History:    Influenza:  historical (10/09/2008)   Appended Document: Pulmonary Consultation fax kim shelton

## 2010-03-12 NOTE — Miscellaneous (Signed)
Summary: Spirometry [facility] 07/2009  Clinical Lists Changes  Observations: Added new observation of PFT COMMENTS: severe obstruction (11/20/2009 16:39) Added new observation of FEF % EXPEC: 21 % (11/20/2009 16:39) Added new observation of FEF25-75%PRE: 1.89 L/min (11/20/2009 16:39) Added new observation of FEF 25-75%: 0.40 L/min (11/20/2009 16:39) Added new observation of FEV1/FVC PRE: 77 % (11/20/2009 16:39) Added new observation of FEV1/FVC: 58 % (11/20/2009 16:39) Added new observation of FEV1 % EXP: 49 % (11/20/2009 16:39) Added new observation of FEV1 PREDICT: 1.72 L (11/20/2009 16:39) Added new observation of FEV1: 0.84 L (11/20/2009 16:39) Added new observation of FVC % EXPECT: 65 % (11/20/2009 16:39) Added new observation of FVC PREDICT: 2.24 L (11/20/2009 16:39) Added new observation of FVC: 1.45 L (11/20/2009 16:39) Added new observation of PFT DATE: 08/01/2009  (11/20/2009 16:39)        Pulmonary Function Test Date: 08/01/2009 Height (in.): 64 Gender: Female  Pre-Spirometry FVC    Value: 1.45 L/min   Pred: 2.24 L/min     % Pred: 65 % FEV1    Value: 0.84 L     Pred: 1.72 L     % Pred: 49 % FEV1/FVC  Value: 58 %     Pred: 77 %    FEF 25-75  Value: 0.40 L/min   Pred: 1.89 L/min     % Pred: 21 %  Comments: severe obstruction

## 2010-04-08 ENCOUNTER — Ambulatory Visit (HOSPITAL_COMMUNITY)
Admission: RE | Admit: 2010-04-08 | Discharge: 2010-04-08 | Disposition: A | Discharge status: Home / Self Care | Payer: Medicare Other | Source: Ambulatory Visit | Attending: Orthopedic Surgery | Admitting: Orthopedic Surgery

## 2010-04-08 ENCOUNTER — Other Ambulatory Visit (HOSPITAL_COMMUNITY): Payer: Self-pay | Admitting: Orthopedic Surgery

## 2010-04-08 ENCOUNTER — Encounter (HOSPITAL_COMMUNITY)
Admission: RE | Admit: 2010-04-08 | Discharge: 2010-04-08 | Disposition: A | Discharge status: Home / Self Care | Payer: Medicare Other | Source: Ambulatory Visit | Attending: Orthopedic Surgery | Admitting: Orthopedic Surgery

## 2010-04-08 DIAGNOSIS — R0602 Shortness of breath: Secondary | ICD-10-CM | POA: Insufficient documentation

## 2010-04-08 DIAGNOSIS — E119 Type 2 diabetes mellitus without complications: Secondary | ICD-10-CM | POA: Insufficient documentation

## 2010-04-08 DIAGNOSIS — Z01818 Encounter for other preprocedural examination: Secondary | ICD-10-CM | POA: Insufficient documentation

## 2010-04-08 DIAGNOSIS — Z01810 Encounter for preprocedural cardiovascular examination: Secondary | ICD-10-CM | POA: Insufficient documentation

## 2010-04-08 DIAGNOSIS — M19011 Primary osteoarthritis, right shoulder: Secondary | ICD-10-CM

## 2010-04-08 DIAGNOSIS — Z01812 Encounter for preprocedural laboratory examination: Secondary | ICD-10-CM | POA: Insufficient documentation

## 2010-04-08 DIAGNOSIS — M19019 Primary osteoarthritis, unspecified shoulder: Secondary | ICD-10-CM | POA: Insufficient documentation

## 2010-04-08 DIAGNOSIS — J45909 Unspecified asthma, uncomplicated: Secondary | ICD-10-CM | POA: Insufficient documentation

## 2010-04-08 DIAGNOSIS — I10 Essential (primary) hypertension: Secondary | ICD-10-CM | POA: Insufficient documentation

## 2010-04-08 LAB — CBC
HCT: 31.3 % — ABNORMAL LOW (ref 36.0–46.0)
MCH: 24 pg — ABNORMAL LOW (ref 26.0–34.0)
MCHC: 31 g/dL (ref 30.0–36.0)
MCV: 77.3 fL — ABNORMAL LOW (ref 78.0–100.0)
Platelets: 297 10*3/uL (ref 150–400)
RBC: 4.05 MIL/uL (ref 3.87–5.11)
RDW: 17.5 % — ABNORMAL HIGH (ref 11.5–15.5)
WBC: 7.9 10*3/uL (ref 4.0–10.5)

## 2010-04-08 LAB — DIFFERENTIAL
Eosinophils Absolute: 0.2 10*3/uL (ref 0.0–0.7)
Lymphocytes Relative: 32 % (ref 12–46)
Monocytes Absolute: 0.4 10*3/uL (ref 0.1–1.0)

## 2010-04-08 LAB — URINALYSIS, ROUTINE W REFLEX MICROSCOPIC
Hgb urine dipstick: NEGATIVE
Nitrite: NEGATIVE
Urobilinogen, UA: 0.2 mg/dL (ref 0.0–1.0)

## 2010-04-08 LAB — BASIC METABOLIC PANEL
Calcium: 9.1 mg/dL (ref 8.4–10.5)
Glucose, Bld: 164 mg/dL — ABNORMAL HIGH (ref 70–99)
Sodium: 141 mEq/L (ref 135–145)

## 2010-04-08 LAB — PROTIME-INR: INR: 1 (ref 0.00–1.49)

## 2010-04-08 LAB — SURGICAL PCR SCREEN
MRSA, PCR: NEGATIVE
Staphylococcus aureus: NEGATIVE

## 2010-04-08 LAB — APTT: aPTT: 32 seconds (ref 24–37)

## 2010-04-08 LAB — TYPE AND SCREEN: Antibody Screen: NEGATIVE

## 2010-04-10 ENCOUNTER — Inpatient Hospital Stay (HOSPITAL_COMMUNITY): Payer: Medicare Other

## 2010-04-10 ENCOUNTER — Inpatient Hospital Stay (HOSPITAL_COMMUNITY)
Admission: RE | Admit: 2010-04-10 | Discharge: 2010-04-14 | DRG: 483 | Disposition: A | Discharge status: Home / Self Care | Payer: Medicare Other | Source: Ambulatory Visit | Attending: Orthopedic Surgery | Admitting: Orthopedic Surgery

## 2010-04-10 DIAGNOSIS — Z951 Presence of aortocoronary bypass graft: Secondary | ICD-10-CM

## 2010-04-10 DIAGNOSIS — E119 Type 2 diabetes mellitus without complications: Secondary | ICD-10-CM | POA: Diagnosis present

## 2010-04-10 DIAGNOSIS — I2510 Atherosclerotic heart disease of native coronary artery without angina pectoris: Secondary | ICD-10-CM | POA: Diagnosis present

## 2010-04-10 DIAGNOSIS — I10 Essential (primary) hypertension: Secondary | ICD-10-CM | POA: Diagnosis present

## 2010-04-10 DIAGNOSIS — M67919 Unspecified disorder of synovium and tendon, unspecified shoulder: Principal | ICD-10-CM | POA: Diagnosis present

## 2010-04-10 DIAGNOSIS — M719 Bursopathy, unspecified: Principal | ICD-10-CM | POA: Diagnosis present

## 2010-04-10 DIAGNOSIS — D62 Acute posthemorrhagic anemia: Secondary | ICD-10-CM | POA: Diagnosis not present

## 2010-04-10 LAB — GLUCOSE, CAPILLARY
Glucose-Capillary: 178 mg/dL — ABNORMAL HIGH (ref 70–99)
Glucose-Capillary: 205 mg/dL — ABNORMAL HIGH (ref 70–99)

## 2010-04-11 LAB — BASIC METABOLIC PANEL
BUN: 17 mg/dL (ref 6–23)
Calcium: 8.3 mg/dL — ABNORMAL LOW (ref 8.4–10.5)
Creatinine, Ser: 0.79 mg/dL (ref 0.4–1.2)
GFR calc Af Amer: >60 mL/min (ref >60)
GFR calc non Af Amer: >60 mL/min (ref >60)

## 2010-04-11 LAB — CBC
MCH: 24 pg — ABNORMAL LOW (ref 26.0–34.0)
MCV: 78.1 fL (ref 78.0–100.0)
Platelets: 268 10*3/uL (ref 150–400)
RDW: 17.5 % — ABNORMAL HIGH (ref 11.5–15.5)
WBC: 10 10*3/uL (ref 4.0–10.5)

## 2010-04-11 LAB — GLUCOSE, CAPILLARY: Glucose-Capillary: 190 mg/dL — ABNORMAL HIGH (ref 70–99)

## 2010-04-12 LAB — CBC
MCV: 78.2 fL (ref 78.0–100.0)
Platelets: 246 10*3/uL (ref 150–400)
RDW: 17.3 % — ABNORMAL HIGH (ref 11.5–15.5)
WBC: 10.6 10*3/uL — ABNORMAL HIGH (ref 4.0–10.5)

## 2010-04-12 LAB — GLUCOSE, CAPILLARY
Glucose-Capillary: 177 mg/dL — ABNORMAL HIGH (ref 70–99)
Glucose-Capillary: 150 mg/dL — ABNORMAL HIGH (ref 70–99)
Glucose-Capillary: 129 mg/dL — ABNORMAL HIGH (ref 70–99)

## 2010-04-12 LAB — BASIC METABOLIC PANEL
BUN: 17 mg/dL (ref 6–23)
GFR calc Af Amer: >60 mL/min (ref >60)
GFR calc non Af Amer: >60 mL/min (ref >60)
Potassium: 4.8 mEq/L (ref 3.5–5.1)
Sodium: 140 mEq/L (ref 135–145)

## 2010-04-13 LAB — GLUCOSE, CAPILLARY
Glucose-Capillary: 99 mg/dL (ref 70–99)
Glucose-Capillary: 113 mg/dL — ABNORMAL HIGH (ref 70–99)

## 2010-04-14 LAB — CROSSMATCH

## 2010-04-14 LAB — GLUCOSE, CAPILLARY

## 2010-04-25 NOTE — Op Note (Signed)
NAME:  [name], [name]                 ACCOUNT NO.:  [id number]  MEDICAL RECORD NO.:  [id number]           PATIENT TYPE:  I  LOCATION:  5006                         FACILITY:  [facility]  PHYSICIAN:  Almedia Balls. Ranell Patrick, M.D. DATE OF BIRTH:  1940/10/19  DATE OF PROCEDURE:  04/10/2010 DATE OF DISCHARGE:                              OPERATIVE REPORT   PREOPERATIVE DIAGNOSIS:  Right shoulder rotator cuff tear arthropathy.  POSTOPERATIVE DIAGNOSIS:  Right shoulder rotator cuff tear arthropathy.  PROCEDURE PERFORMED:  Right shoulder reverse total shoulder arthroplasty using DePuy Delta Xtend prosthesis.  SURGEON:  Almedia Balls. Ranell Patrick, MD  ASSISTANT:  Modesto Charon, [place]  General anesthesia was used plus interscalene block.  ESTIMATED BLOOD LOSS:  200 mL.  FLUID REPLACEMENT:  2000 mL of crystalloid.  Instrument counts correct.  There were no complications.  Perioperative antibiotics were given.  INDICATIONS:  The patient is a 70 year old female with a history of worsening right shoulder pain secondary to a rotator cuff tear arthropathy.  The patient has had progressive shoulder loss of function and increased pain, and desires operative treatment.  We have discussed options for treatment with her including continued conservative management with injections, modification of activity, pain medications versus shoulder arthroplasty.  The patient is a candidate for reverse shoulder arthroplasty given rotator cuff tear arthropathy.  Informed consent was obtained.  DESCRIPTION OF PROCEDURE:  After an adequate level of anesthesia was achieved, the patient was positioned in the modified beach-chair position.  Right shoulder was sterilely prepped and draped in the usual manner.  Deltopectoral approach was utilized starting at the coracoid process, extending down to the anterior humerus, dissection down through subcutaneous tissues.  The cephalic vein was identified, taken laterally with the  deltoid, the pectoralis taken medially.  The upper centimeter pectoralis was released.  Conjoined tendon identified and taken medially.  Subscapularis released directly off the lesser tuberosity and it was tacked with suture.  We then removed the anterior-inferior capsule, then released soft tissue off the humeral neck.  We then released the remaining rotator cuff tissue, which was not much, delivered the humerus out of the wound.  We then reamed up to size 12 reamer and then used our cutting guide for the head which was a deltopectoral cutting guide, referencing intramedullary, and then performed our cut in approximately 10 degrees of retroversion.  At this point, we went ahead and placed a metal shim on top of the cut humerus, subluxed the bone posteriorly, the humerus posteriorly, and then I had a good view of the socket.  Performed a 360-degree release of the soft tissue around the glenoid socket removing the remaining capsule and labrum exposing the socket in a manner that we could see the inferior neck of the scapula, and get a good fill for where the patient's scapula was oriented.  We removed remaining cartilage with a Cobb elevator.  We then placed our guide pin in the center of the glenoid and then reamed with a metaglene.  We did our hand peripheral reamer to make sure that glenosphere would fit onto the metaglene and then drill up  the central peg hole.  We then introduced the metaglene and packed that in position. We used 2 locked and 2 non-locked screws.  I had 48 inferiorly and 30 proximally with good purchase and that 18  with good purchase anteriorly and posteriorly.  We then placed a 38 standard glenosphere in place and screwed that into position.  We then directed our attention towards the humerus.  We reamed for size 1 right humerus and then went ahead and introduced our 12 stem with a size 1 right metaphysis into positions set on 0 degrees on the attachment for the  metaphysis on the stem, and then the implant was in 10 degrees of retroversion.  We had excellent coverage, good fit.  We went ahead and reduced it with a 38 +3 trial, happy with the soft tissue balance and tension.  We then removed the trial components, thoroughly pulse irrigated and inspected the shoulder one more time.  Axillary nerve was checked in the good condition.  We then placed the real hydroxyapatite coated Press-Fit stem, 12 stem, size 1 right metaphysis set on 0 degrees and then placed in 10 degrees of retroversion, impacted into place with some bone graft medially to give positional stability.  We had nice stable implant.  We then trialed the +3 and +6.  We were happy with the +6 giving good conjoined tendon tension and absolutely no gapping, no shuck.  We were happy with that and placed the real +6 poly in place, 30 +6, impacted in position, reduced the shoulder, and again we were happy with soft tissue balancing and stability.  We thoroughly irrigated the wound.  I then closed the deltopectoral interval with 0 Vicryl suture followed by 2-0 Vicryl subcutaneous closure and 4-0 Monocryl for skin.  Steri-Strips applied followed by sterile dressing.  The patient tolerated the surgery well.     Almedia Balls. Ranell Patrick, M.D.     SRN/MEDQ  D:  04/10/2010  T:  04/11/2010  Job:  [id number]  Electronically Signed by Malon Kindle  on 04/25/2010 11:22:43 AM

## 2010-04-26 LAB — GLUCOSE, CAPILLARY: Glucose-Capillary: 142 mg/dL — ABNORMAL HIGH (ref 70–99)

## 2010-05-15 LAB — POCT I-STAT GLUCOSE: Operator id: 141321

## 2010-06-01 ENCOUNTER — Telehealth: Payer: Self-pay | Admitting: Cardiovascular Disease

## 2010-06-01 NOTE — Telephone Encounter (Signed)
Left msg with office return number to call for further advice. Pt may call PCP also for advice for edema. No CHF noted. Alfonso Ramus RN

## 2010-06-01 NOTE — Telephone Encounter (Signed)
SHOULDER SURG. 04/10/10. NOW FEET AND ANKLES ARE SWELLING AND ANKLES DISAPPEAR,  SHE HAS SOAKED AND ARISED FEET AND LEGS AND NOTHING IS HELPING. GOES DOWN OVER NIGHT BUT THEN THEY SWELLS WHEN SHE GETS UP

## 2010-06-03 NOTE — Discharge Summary (Signed)
  NAME:  Tissue, [name]                 ACCOUNT NO.:  [id number]  MEDICAL RECORD NO.:  [id number]           PATIENT TYPE:  I  LOCATION:  5006                         FACILITY:  [facility]  PHYSICIAN:  Almedia Balls. Ranell Patrick, M.D. DATE OF BIRTH:  09-29-1940  DATE OF ADMISSION:  04/10/2010 DATE OF DISCHARGE:  04/14/2010                              DISCHARGE SUMMARY   ADMITTING DIAGNOSIS:  Right shoulder probe rotator cuff tear arthropathy.  DISCHARGE DIAGNOSIS:  Right shoulder probe rotator cuff tear arthropathy.  PROCEDURE PERFORMED:  Right shoulder reverse total shoulder arthroplasty performed by Dr. Ranell Patrick.  CONSULTING SERVICES:  Physical therapy, occupational therapy, discharge planning.  HISTORY OF PRESENT ILLNESS:  The patient is a 70 year old female presents with a history of worsening right shoulder pain and loss of function secondary to rotator cuff tear arthropathy.  Due to failure of nonoperative treatment, the patient elected to proceed with surgical management and was admitted for reverse total shoulder arthroplasty in the right.  For further details of the patient's past medical history and physical exam, please see the hospital record.  HOSPITAL COURSE:  The patient admitted to Orthopedics on April 10, 2010, where she underwent an uncomplicated right shoulder reverse shoulder arthroplasty.  The patient was postoperatively taken to the orthopedic floor where she was stable after surgery, remaining afebrile, tolerating regular diet.  The patient had occupational therapy and physical therapy and was mobilized.  Wound looked clean.  The patient was controlled on oral medications and scheduled for discharge on April 14, 2010 with instructions to follow up in Orthopedics in 2 weeks.  She is discharged on Percocet for pain and Robaxin for muscle spasms and her preadmission medications and regular diet.  We will be seeing her back in clinic in 2 weeks.     Almedia Balls. Ranell Patrick,  M.D.     SRN/MEDQ  D:  05/13/2010  T:  05/14/2010  Job:  [id number]  Electronically Signed by Malon Kindle  on 06/03/2010 05:15:30 PM

## 2010-06-23 NOTE — Cardiovascular Report (Signed)
NAME:  [name], [name]                 ACCOUNT NO.:  [id number]   MEDICAL RECORD NO.:  [id number]          PATIENT TYPE:  INP   LOCATION:  2901                         FACILITY:  [facility]   PHYSICIAN:  Vesta Mixer, M.D. DATE OF BIRTH:  1940-07-22   DATE OF PROCEDURE:  03/01/2007  DATE OF DISCHARGE:                            CARDIAC CATHETERIZATION   INDICATIONS:  [name] is a 70 year old female with a history of  diabetes mellitus.  She presented this morning with chest pain.  She was  found to have mildly positive cardiac enzymes and was found to have some  ST-segment elevation in inferior leads.  She is referred for heart  catheterization because of persistent chest pain.   PROCEDURE:  Left heart catheterization with coronary angiography.   The right femoral artery was easily cannulated using modified Seldinger  technique.   HEMODYNAMIC RESULTS:  LV pressure is 116/12 with an aortic pressure of  114/65.   ANGIOGRAPHY:  Left main.  The left main is large and normal.  It is  somewhat ectatic.  The left anterior descending artery is a very large  vessel.  There is a 60% stenosis in the proximal segment.  There is then  a ruptured plaque that represents approximately 80-90% stenosis.  This  ruptured plaque extends into the first diagonal vessel.  The mid and the  distal LAD have only minor luminal irregularities.   The first diagonal artery has a 90% stenosis at its origin.  This is  followed by an 80% hazy stenosis.   The left circumflex artery is very tortuous, but is fairly large and  ectatic.  There is mild irregularities.   The right coronary artery is very tortuous and has a Shepherd's crook.  There is a proximal 50% stenosis.  The mid and distal vessel are  unremarkable.  The posterior descending artery and posterolateral  segment artery are normal.   The left internal mammary artery in the left subclavian artery are  unremarkable.   Left ventriculogram reveals  supranormal left ventricular systolic  function.  The ejection fraction is approximately 75-80%.   COMPLICATIONS:  None.   CONCLUSION:  The patient has significant coronary artery disease  involving a ruptured plaque located in the proximal LAD.  This ruptured  plaque extends into the first diagonal artery.  This makes a very  complex lesion that would probably best be suited by coronary artery  bypass grafting.  In light of her age and her diabetes, I think that  this is probably our best option.  We could attempt angioplasty,  although she has such severe diffuse disease involving the segment that  I am not sure that we would get a very good result.  We will consult  CVTS tomorrow.  She is quite stable.  Will leave her off of anticoagulants for now.  We  will consider starting Integrilin if she has recurrent episodes of chest  pain or if she has recurrent EKG changes.  She is stable the leaving  lab.  She is still having very mild chest pain.  ______________________________  Vesta Mixer, M.D.     PJN/MEDQ  D:  03/01/2007  T:  03/02/2007  Job:  [id number]   cc:   Merlene Laughter. Renae Gloss, M.D.

## 2010-06-23 NOTE — Discharge Summary (Signed)
[name], [name] NO.:  [id number]   MEDICAL RECORD NO.:  [id number]          PATIENT TYPE:  INP   LOCATION:  2030                         FACILITY:  [facility]   PHYSICIAN:  Evelene Croon, M.D.     DATE OF BIRTH:  [birth year]   DATE OF ADMISSION:  03/01/2007  DATE OF DISCHARGE:                               DISCHARGE SUMMARY   HISTORY OF THE PRESENT ILLNESS:  The patient is a 70 year old black  female with no prior cardiac history who reported at least a month  history of poor energy level and shortness of breath.  On the Wednesday  prior to admission she developed an episode of substernal chest pain  which she had never had before.  This also radiated into her neck and  jaw.  She presented to the [facility] Emergency Room where her  enzyme levels were minimally elevated and electrocardiogram showed  slight inferior ST elevation.  She was felt to require further  cardiology evaluation and was transferred to [facility] for  further evaluation and treatment to include cardiac catheterization.   PAST MEDICAL HISTORY:  1. Significant for diabetes mellitus type 2.  2. History of hypertension.  3. History of hyperlipidemia.  4. Status post bilateral rotator cuff repair in the past.  5. Status post right carpal tunnel repair.  6. Status post hysterectomy.  7. 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   MEDICATIONS PRIOR TO ADMISSION:  1. Lisinopril 20 mg daily.  2. Aspirin 325 mg daily.  3. Initial insulin 75/25, 30 units q.a.m. in a sliding scale with      dinner.   ALLERGIES:  1. MORPHINE.  2. CODEINE.  3. SULFA.   FAMILY HISTORY/SOCIAL HISTORY/REVIEW OF SYMPTOMS/PHYSICAL EXAM:  Please  see the History and Physical done at the time of admission.   HOSPITAL COURSE:  The patient was admitted and transferred to [facility].  She was taken to the Cardiac Catheterization Lab on March 01, 2007.  Findings included  a normal left main.  The LAD was large with  a 60% proximal stenosis and then a ruptured plaque with about an 80%  stenosis extending into the first diagonal branch.  The first diagonal  also had a 90% stenosis at its origin followed by an 80% hazy stenosis.  The left circumflex had no significant disease.  The right coronary  artery had a 50% proximal stenosis.  Left ventricular function was  normal at 75%.  There was no gradient across the aortic valve.  The  patient was felt to require surgical consultation and this was obtained  with Evelene Croon, MD, who evaluated the patient, studies and agreed  with recommendations to proceed with surgical revascularization.  The  patient was felt to be stable medically and she was taken for surgery on  March 06, 2007.   PROCEDURE:  Coronary artery bypass grafting x3.  The following grafts  were placed:  1. Left internal mammary artery to the LAD.  2. Saphenous vein graft to the diagonal.  3. A saphenous  vein graft to the right coronary.   The patient tolerated the procedure well, was taken to the Surgical  Intensive Care Unit in stable condition.   POSTOPERATIVE HOSPITAL COURSE:  Overall, the patient has done quite  well.  Of note, she did have a preoperative urinary tract infection and  received a full course of Cipro for this.  All routine lines, monitors  and drainage devices were discontinued in the standard fashion.  Her  diabetes has been under adequate control initially with the Glucomander  protocol and subsequent conversion to Lantus and sliding scale.  The  patient was weaned from the ventilator without significant difficulty.  She did have some postoperative bronchospasm requiring beta agonists and  this improved over time.  Oxygen has been weaned.  Her hemodynamics have  remained stable, although she has had some sinus tachycardia.  Her beta-  blocker has been titrated for this and she is stable in this regard.  She has had no  significant cardiac dysrhythmias or ectopy.  Laboratory  values do reveal a mild postoperative anemia but hemoglobin and  hematocrit have stabilized at 10.7 and 32.1 respectively.  Electrolytes,  BUN and creatinine are within normal limits with most recent BUN and  creatinine dated March 10, 2007, of 12 and 0.62 respectively.  The  patient's incisions are healing well without evidence of infection.  She  is tolerating diet.  She is tolerating routine activities using standard  cardiac rehabilitation phase 1 modalities.  Her overall status is felt  to be tentatively stable for discharge in the morning of March 11, 2007, pending morning round re-evaluation.   INSTRUCTIONS:  The patient will receive written instructions in regard  to medications, activity, diet, wound care and followup.  Followup will  include Dr. Laneta Simmers on March 28, 2007, at noon.  Additionally, she  will see Dr. Elease Hashimoto in 2 weeks.   MEDICATIONS ON DISCHARGE AT THE TIME OF THIS DICTATION:  Include the  following:  1. Aspirin 325 mg daily.  2. Lopressor 50 mg twice daily.  3. Crestor 10 mg daily.  4. Insulin 75/25, 30 units q.a.m. as previously used prior to      admission.  Additionally, she will resume her home sliding scale      insulin regimen.  5. Ventolin metered dose inhaler 2 puffs q.6h. p.r.n. as needed for      wheezing.  6. For pain she has been using Tylenol.  She will continue this as      needed.   FINAL DIAGNOSIS:  Severe coronary artery disease as described above, now  status post surgical revascularization, coronary artery bypass grafting  times three.   OTHER DIAGNOSES:  1. Mild postoperative anemia.  2. Mild postoperative volume overload, resolved.  3. Diabetes mellitus type 2, currently insulin dependent.  4. Hypertension.  5. Hyperlipidemia.  6. History of previous bilateral rotator cuff repair.  7. History of right carpal tunnel repair.  8. History of hysterectomy.  9. 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      Rowe Clack, P.A.-C.      Evelene Croon, M.D.  Electronically Signed    WEG/MEDQ  D:  03/10/2007  T:  03/10/2007  Job:  [id number]   cc:   Evelene Croon, M.D.  Vesta Mixer, M.D.

## 2010-06-23 NOTE — H&P (Signed)
NAME:  [name], [name]                     ACCOUNT NO.:  o   MEDICAL RECORD NO.:  [id number]          PATIENT TYPE:  EMS   LOCATION:  ED                           [facility]   PHYSICIAN:  Altha Harm, MDDATE OF BIRTH:  27-Jul-1940   DATE OF ADMISSION:  03/01/2007  DATE OF DISCHARGE:                              HISTORY & PHYSICAL   CHIEF COMPLAINT:  Chest pain.   HISTORY OF PRESENT ILLNESS:  This is a 70 year old lady with a history  of diabetes type 2 and hypertension who presents to the emergency room  with approximately 6 hours of chest pain.  Her intensity of pain is  currently 1/10. The patient states that at 7:30 this morning she  awakened and noticed she was having a feeling of someone standing on her  chest. She states that the pain was nonradiating. It was dull and  pressure-like in nature. She rates the pain at a 7/10 at it's greastest  intensity. She denies that the pain was associated with any shortness of  breath or diaphoresis. The patient also states that she had pain in her  left arm and into her back area. The patient denies any prior episodes  of pain of this nature.   The patient states that her blood sugars are usually well controlled.  Family history significant for a myocardial infarction in her father at  age 42 which he survived. The patient had a stress done approximately 3  years ago and a 2-D echocardiogram done approximately 2 years ago. The  patient states that she has only recently been initiated on treatment  for her high blood pressure but had been taking Altace prior to that for  renal protection.   PAST MEDICAL HISTORY:  1. Diabetes type 2.  2. Hypertension.  3. Removal of a cyst in the right kidney.  4. Bilateral rotator cuff repair.  5. Total abdominal hysterectomy.   FAMILY HISTORY:  1. Diabetes type 2.  2. Coronary artery disease and myocardial infarction in her father. He      had an MI at age 55 and survived up until age  63.   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 She denies any tobacco, alcohol or  drug use.   CURRENT MEDICATIONS:  1. Insulin Humalog 75/25, 30 units subcu q.a.m. and sliding scale at      dinner time.  2. Aspirin 325 mg p.o. daily.  3. Multivitamin.  4. Antihypertensive medication possibly lisinopril.   ALLERGIES:  CODEINE, MORPHINE SULFATE, and SULFA.   PRIMARY CARE PHYSICIAN:  Dr. Andi Devon.   REVIEW OF SYSTEMS:  Twelve systems are reviewed, all systems are  negative except as noted in the HPI.   STUDIES IN EMERGENCY ROOM:  White blood cell count 7.4, hemoglobin 12.4,  hematocrit 37.3, platelets 331. Sodium 139, potassium 4.0, chloride 105,  bicarb 28, BUN 12, creatinine 0.69. Point of care markers shows a  myoglobin of 52.4, CK-MB of 1.6 and a troponin  of less than 0.05.  Initial 12 lead EKG which shows no ST-T wave changes.   PHYSICAL EXAMINATION:  GENERAL:  The patient is laying in bed  comfortably in no acute distress. She is a well-nourished, well-  developed female.  VITAL SIGNS:  Blood pressure 114/57, heart rate 84, respiratory rate 14,  O2 sats 98% on 2 liters.  HEENT:  She is normocephalic, atraumatic. Pupils equal round and  reactive to light and accommodation. Extraocular movements intact.  Tympanic membranes are translucent bilaterally with good landmarks.  Oropharynx is moist. No exudate, erythema or lesions are noted.  NECK:  Trachea is midline. No masses, no thyromegaly. No JVD, no carotid  bruit.  RESPIRATORY:  The patient has a normal respiratory effort. She has got  equal excursion bilaterally. There is no wheezing or rales noted on  examination.  CARDIOVASCULAR:  She has got a normal S1 and S2. No murmurs, rubs or  gallops are noted. PMI is nondisplaced. No heaves or thrills on  palpation.  ABDOMEN:  She has got normal active bowel sounds. Soft, nontender,  nondistended. No  masses, no hepatosplenomegaly noted.  MUSCULOSKELETAL:  The patient has tenderness to palpation over the  sternum and the intercostal chondroidal joints of the left side of the  chest. She has got no warm flora nor erythema around the joints. Joints  have full range of motion.  NEUROLOGIC:  Cranial nerves II-XII are grossly intact. She has got no  focal neurological deficits. Sensation is intact to light touch, pin  prick and proprioception. DTRs are 2+ bilaterally upper and lower  extremities.  PSYCHIATRIC:  This is a very pleasant and appropriate lady whose got  good insight and cognition, good recent and remote recall.   ASSESSMENT/PLAN:  This is a patient with multiple risk factors who  presents with chest pain. The patient has been seen by [facility]  Cardiology as an outpatient in the past. I will go ahead and start  serial enzymes on the patient. However, in light of her multiple risk  factors and her family history, I will ask [facility] to see  the patient again as she a stress test or cardiac cath dependent upon  course of symptoms and clinical findings. while hospitalized. The  patient will also have her cholesterol profile done while she is in the  hospital. We will put the patient back on her usual medications for her  diabetes and check her hemoglobin A1c to ensure good control.      Altha Harm, MD  Electronically Signed     MAM/MEDQ  D:  03/01/2007  T:  03/01/2007  Job:  [id number]   cc:   Merlene Laughter. Renae Gloss, M.D.  Fax: [phone]

## 2010-06-23 NOTE — H&P (Signed)
NAME:  [name], [name]                 ACCOUNT NO.:  [id number]   MEDICAL RECORD NO.:  [id number]          PATIENT TYPE:  JCAR   LOCATION:                               FACILITY:  [facility]   PHYSICIAN:  Vesta Mixer, M.D. DATE OF BIRTH:  Nov 08, 1940   DATE OF ADMISSION:  10/11/2008  DATE OF DISCHARGE:                              HISTORY & PHYSICAL   [name] is a middle-aged female with a history of coronary artery  disease and coronary artery bypass grafting.  She is admitted now for  heart catheterization after having consistent episodes of chest pain  relieved with nitroglycerin.   [name] is a middle-aged female with a history of coronary artery  disease.  She had a heart catheterization in January 2009, which  revealed significant coronary disease.  She had coronary artery bypass  grafting at that time.  She was also noted to have a history of diabetes  mellitus.  She has had intermittent episodes of chest pain since her  bypass surgery.  A stress Cardiolite study back in October 2009,  revealed no evidence of ischemia.  She had normal left ventricular  systolic function.  She has continued to have intermittent episodes of  chest pain and started taking nitroglycerin.  The nitroglycerin relieved  the chest pain after just a few minutes.   The chest pain is somewhat atypical.  It is not exertional.  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  She  exercises three times a week and this is typically not when she has  episodes of chest pain.  There is slight radiation across the chest.  There is no shortness of breath.  There is no diaphoresis.  The pain  lasts for 5 or 10 minutes.   CURRENT MEDICATIONS:  1. Lisinopril 20 mg a day.  2. Crestor 20 mg a day.  3. Multivitamin once a day.  4. Vitamin D once a day.  5. Alprazolam 0.25 mg a day.  6. Aspirin 325 mg a day.  7. Methocarbamol twice a day.  8. Doxazosin 2 mg twice a day.  9. Lovaza 2 g a  day.   ALLERGIES:  She is allergic to CODEINE, SULFA, MORPHINE.  AMLODIPINE  causes ankle edema.   PAST MEDICAL HISTORY:  1. History of coronary artery disease.  She is status post coronary      artery bypass grafting.  She has had left internal mammary graft to      the LAD, saphenous vein graft to the diagonal artery, and saphenous      vein graft to the right coronary artery, performed by Dr. Rexanne Mano.  2. Diabetes mellitus.  3. Hyperlipidemia.  4. Hypertension.   SOCIAL HISTORY:  The patient does not smoke.  She used to smoke 24 years  ago.  She works as a Armed forces training and education officer.  She does not drink alcohol.   FAMILY HISTORY:  Positive for cardiac disease.   REVIEW OF SYSTEMS:  She denies any fevers or chills.  She  denies any  change of vision.  She denies any problems with her ears, nose, and  throat.  She has had some feet swelling and an occasional chest pain as  noted above.  She has shortness of breath when walking upstairs.  Her  appetite is good.  She has occasional episodes of heartburn.  She denies  any urinary dribbling.  She denies any pain or lumps in her breast.  She  denies any skin changes.  She denies any back pain.  She does have some  muscle and joint pain.  She denies any seizures or headaches.  She  denies any anxiety or depression.  She denies any anemia or thrush.   PHYSICAL EXAMINATION:  GENERAL:  She is a middle-aged female in no acute  distress.  She is alert and oriented x3.  Her mood and affect are  normal.  VITAL SIGNS:  Her weight is 220 which is up 3 pounds, blood pressure is  128/60 with a heart rate of 88.  HEENT/NECK:  2+ carotids.  She has no bruits, no JVD, no thyromegaly.  Her neck is supple.  Her sclerae are nonicteric.  Her mucous membranes  are moist.  LUNGS:  Clear.  HEART:  Regular rate.  S1 and S2.  Her PMI is nondisplaced.  ABDOMEN:  Good bowel sounds and is nontender.  EXTREMITIES:  She has no clubbing, cyanosis, or  edema.  NEUROLOGIC:  Nonfocal.  Her gait is normal.  Her pulses are intact.   [name] presents with episodes of chest discomfort.  It has been  quite difficult to sort out these episodes of chest pain.  She has had a  stress Cardiolite study, which was negative.  At this point, I think our  best plan would be to proceed with heart catheterization since she has  been so difficult to sort out.  We have scheduled her for heart  catheterization in the outpatient cath lab.  We have discussed the  risks, benefits, and options concerning heart catheterization.  She  understands and agrees to proceed.      Vesta Mixer, M.D.  Electronically Signed     PJN/MEDQ  D:  10/07/2008  T:  10/08/2008  Job:  [id number]   cc:   Merlene Laughter. Renae Gloss, M.D.

## 2010-06-23 NOTE — Op Note (Signed)
[name], [name]              ACCOUNT NO.:  [id number]   MEDICAL RECORD NO.:  [id number]          PATIENT TYPE:  INP   LOCATION:  2314                         FACILITY:  [facility]   PHYSICIAN:  Evelene Croon, M.D.     DATE OF BIRTH:  [birth year]   DATE OF PROCEDURE:  03/06/2007  DATE OF DISCHARGE:                               OPERATIVE REPORT   PREOPERATIVE DIAGNOSIS:  Severe two-vessel coronary disease with  unstable angina.   POSTOPERATIVE DIAGNOSIS:  Severe two-vessel coronary disease with  unstable angina.   OPERATIVE PROCEDURE:  Median sternotomy, extracorporeal circulation,  coronary bypass graft surgery x3 using a left internal mammary artery  graft to left anterior descending coronary, with a saphenous vein graft  to diagonal branch of the left anterior descending, and a saphenous vein  graft to the right coronary.   SURGEON:  Evelene Croon, M.D.   ASSISTANT:  Edwina Barth, M.D.   SECOND ASSISTANT:  Gershon Crane, PA-C.   ANESTHESIA:  General endotracheal.   CLINICAL HISTORY:  This patient is a 70 year old obese black female with  history of diabetes, hypertension, hyperlipidemia who was admitted with  substernal chest pain and essentially negative enzymes with mild  inferior ST elevation.  Cardiac catheterization by Dr. Elease Hashimoto showed a  complex proximal LAD and diagonal stenosis with about 80% in the LAD  before the diagonal and 90% diagonal stenosis.  There is also about 50%  proximal right coronary stenosis within a crook.  Ejection fraction was  normal.  She continued to have some chest pain off and on while in  hospital and required intravenous nitroglycerin.  I felt that coronary  bypass graft surgery would be the best long-term treatment for her given  her anatomy.  I discussed the operative procedure with her and her  family including alternatives, benefits, and risks including but not  limited to bleeding, blood transfusion, infection, stroke,  myocardial  infarction, graft failure, and death.  She understood and agreed to  proceed.   OPERATIVE PROCEDURE:  The patient was taken to the operating room and  placed on table in supine position.  After induction of general  endotracheal anesthesia, a Foley catheter was placed in the bladder  using sterile technique.  Then the chest, abdomen and both lower  extremities were prepped and draped in usual sterile manner.  The chest  was entered through a median sternotomy incision.  The pericardium  opened midline.  Examination of the heart showed good ventricular  contractility.  The ascending aorta had no palpable plaques in it.   Then the left internal mammary artery was harvested from the chest wall  as a pedicle graft.  This is a medium caliber vessel with excellent  blood flow through it.  At the same time a segment of greater saphenous  vein was harvested from the right leg using endoscopic vein harvest  technique.  This vein was of medium size and good quality.   Then the patient was heparinized and when an adequate activated clotting  time was achieved, the distal ascending aorta was cannulated using a 20-  [place]  aortic cannula for arterial inflow.  Venous outflow was achieved  using a two-stage venous cannula for the right atrial appendage.  An  antegrade cardioplegia and vent cannula was inserted in the aortic root.   The patient placed on cardiopulmonary bypass and distal coronaries  identified.  The LAD was intramyocardial in its proximal and mid  portions but exited through the surface of the heart in the distal third  where it was a large graftable vessel.  The diagonal branch was also a  large graftable vessel.  It was heavily diseased proximally.  The right  coronary artery was diffusely diseased throughout its proximal and mid  portions.  It was grafted distally where it was soft with minimal  disease.   Then the aorta was crossclamped and 1000 mL of cold blood  antegrade  cardioplegia was administered in the aortic root with quick arrest the  heart.  Systemic hypothermia to 20 degrees centigrade and topical  hypothermic iced saline was used.  A temperature probe placed in septum  insulating pad in the pericardium.   Then the first distal anastomosis was performed to the distal right  coronary.  The internal diameter was greater than 2.5 mm.  Conduit used  was a segment of greater saphenous vein and the anastomosis formed in  end-to-side manner using continuous 7-0 Prolene suture.  Flow was noted  through the graft was excellent.   The second distal anastomosis was performed to the diagonal branch.  The  internal diameter of this vessel was about 2 mm.  Conduit used was the  second segment greater saphenous vein.  Anastomosis formed end-to-side  manner using continuous 7-0 Prolene suture.  Flow was noted through the  graft was excellent.   The third distal anastomosis was performed to the distal LAD.  The  internal diameter was 2 mm.  Conduit used was the left internal mammary  graft was brought through an opening left pericardium anterior to the  phrenic nerve.  It anastomosed to the LAD in end-to-side manner using  continuous 8-0 Prolene suture.  The pedicle was sutured to the  epicardium with 6-0 Prolene sutures.  The patient then given another  dose of cardioplegia.  With the crossclamp in place the two proximal  vein graft anastomoses were performed to the aortic root in an end-to-  side manner using continuous 6-0 Prolene suture.  Then a clamp was  removed from mammary pedicle.  There is rapid warming of the ventricular  septum and return of spontaneous ventricular fibrillation.  The  crossclamp removed time of 50 minutes.  The patient spontaneously  converted sinus rhythm.   The proximal and distal anastomoses appeared hemostatic, lie of the  grafts satisfactory.  Graft markers placed around the proximal  anastomoses.  Two temporary  right ventricular and right atrial pacing  wires placed above the skin.   When the patient rewarmed to 37 degrees centigrade, she was weaned from  cardiopulmonary bypass on no inotropic agents.  Total bypass time was 68  minutes.  Cardiac function appeared excellent with a cardiac output of 5  liters per minute.  Protamine was given and venous and aortic cannulas  without difficulty.  Hemostasis was achieved.  Three chest tubes were  placed into the post pericardium of left pleural space on the anterior  mediastinum.  The sternum was then closed with double #6 stainless steel  wires.  Fascia was closed with an continuous #1 Vicryl suture.  Subcutaneous tissue was closed with  continuous 2-0 Vicryl and the skin  with 3-0 Vicryl subcuticular closure.  Lower extremity vein harvest  sites were closed in layers in a similar manner.  The sponge, needle and  instrument  counts were correct according to the scrub nurse.  Dry sterile dressing  applied over the incisions around the chest tubes which were hooked to  Pleur-Evac suction.  The patient remained hemodynamically stable,  transferred to the SICU in guarded but stable condition.      Evelene Croon, M.D.  Electronically Signed     BB/MEDQ  D:  03/06/2007  T:  03/07/2007  Job:  [id number]   cc:   Vesta Mixer, M.D.

## 2010-06-23 NOTE — Procedures (Signed)
DUPLEX DEEP VENOUS EXAM - LOWER EXTREMITY   INDICATION:  Left leg pain and swelling.   HISTORY:  Edema:  Yes  Trauma/Surgery:  No  Pain:  Yes  PE:  Previous DVT:  Anticoagulants:  Other:  DUPLEX EXAM:                CFV   SFV    PopV  PTV   GSV                R  L  R   L  R  L  R  L  R   L  Thrombosis    0  0      0     0     0      0  Spontaneous   +  +      +     +     +      +  Phasic        +  +      +     +     +      +  Augmentation  +  +      +     +     +      +  Compressible  +  +      +     +     +      +  Competent   Legend:  + - yes  o - no  p - partial  D - decreased   IMPRESSION:  No evidence of deep vein thrombosis noted in the left leg.    _____________________________  Janetta Hora. Fields, MD   MG/MEDQ  D:  08/19/2006  T:  08/20/2006  Job:  [id number]

## 2010-06-23 NOTE — H&P (Signed)
NAME:  [name], [name]                     ACCOUNT NO.:  o   MEDICAL RECORD NO.:  [id number]          PATIENT TYPE:  EMS   LOCATION:  ED                           [facility]   PHYSICIAN:  Altha Harm, MDDATE OF BIRTH:  November 25, 1940   DATE OF ADMISSION:  03/01/2007  DATE OF DISCHARGE:                              HISTORY & PHYSICAL   CHIEF COMPLAINT:  Chest pain.   HISTORY OF PRESENT ILLNESS:  This is a 70 year old lady with a history  of diabetes type 2 and hypertension who presents to the emergency room  with approximately 6 hours of chest pain. The patient states that at  7:30 this morning she awakened and noticed she was having a feeling of  someone standing on her chest. She states that the pain was  nonradiating. It was dull and pressure-like in nature. She rates the  pain at a 7/10. She denies that the pain was associated with any  shortness of breath or diaphoresis. The patient also states that she had  pain in her left arm and into her back area. The patient denies any  prior episodes of pain of this nature.   The patient states that her blood sugars are usually well controlled.  Family history significant for a myocardial infarction in her father at  age 37 which he survived. The patient had a stress done approximately 3  years ago and a 2-D echocardiogram done approximately 2 years ago. The  patient states that she has only recently been initiated on treatment  for her high blood pressure but had been taking Altace prior to that for  renal protection.   PAST MEDICAL HISTORY:  1. Diabetes type 2.  2. Hypertension.  3. Removal of a cyst in the right kidney.  4. Bilateral rotator cuff repair.  5. Total abdominal hysterectomy.   FAMILY HISTORY:  1. Diabetes type 2.  2. Coronary artery disease and myocardial infarction in her father. He      had an MI at age 82 and survived up until age 38.   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 She denies any tobacco, alcohol or  drug use.   CURRENT MEDICATIONS:  1. Insulin Humalog 75/25, 30 units subcu q.a.m. and sliding scale at      dinner time.  2. Aspirin 325 mg p.o. daily.  3. Multivitamin.  4. Antihypertensive medication possibly lisinopril.   ALLERGIES:  CODEINE, MORPHINE SULFATE, and SULFA.   PRIMARY CARE PHYSICIAN:  Dr. Andi Devon.   REVIEW OF SYSTEMS:  Twelve systems are reviewed, all systems are  negative except as noted in the HPI.   STUDIES IN EMERGENCY ROOM:  White blood cell count 7.4, hemoglobin 12.4,  hematocrit 37.3, platelets 331. Sodium 139, potassium 4.0, chloride 105,  bicarb 28, BUN 12, creatinine 0.69. Point of care markers shows a  myoglobin of 52.4, CK-MB of 1.6 and a troponin of less than 0.05.   PHYSICAL EXAMINATION:  GENERAL:  The patient  is laying in bed  comfortably in no acute distress. She is a well-nourished, well-  developed female.  VITAL SIGNS:  Blood pressure 114/57, heart rate 84, respiratory rate 14,  O2 sats 98% on 2 liters.  HEENT:  She is normocephalic, atraumatic. Pupils equal round and  reactive to light and accommodation. Extraocular movements intact.  Tympanic membranes are translucent bilaterally with good landmarks.  Oropharynx is moist. No exudate, erythema or lesions are noted.  NECK:  Trachea is midline. No masses, no thyromegaly. No JVD, no carotid  bruit.  RESPIRATORY:  The patient has a normal respiratory effort. She has got  equal excursion bilaterally. There is no wheezing or rales noted on  examination.  CARDIOVASCULAR:  She has got a normal S1 and S2. No murmurs, rubs or  gallops are noted. PMI is nondisplaced. No heaves or thrills on  palpation.  ABDOMEN:  She has got normal active bowel sounds. Soft, nontender,  nondistended. No masses, no hepatosplenomegaly noted.  MUSCULOSKELETAL:  The patient has tenderness to palpation over the  sternum and the  intercostal chondroidal joints of the left side of the  chest. She has got no warm flora nor erythema around the joints. Joints  have full range of motion.  NEUROLOGIC:  Cranial nerves II-XII are grossly intact. She has got no  focal neurological deficits. Sensation is intact to light touch, pin  prick and proprioception. DTRs are 2+ bilaterally upper and lower  extremities.  PSYCHIATRIC:  This is a very pleasant and appropriate lady whose got  good insight and cognition, good recent and remote recall.   ASSESSMENT/PLAN:  This is a patient with multiple risk factors who  presents with chest pain. The patient has been seen by [facility] as an outpatient in the past. I will go ahead and start  serial enzymes on the patient. However, in light of her multiple risk  factors and her family history, I will ask [facility] to see  the patient again as she may possibly benefit from a stress test while  hospitalized. The patient will also have her cholesterol profile done  while she is in the hospital. We will put the patient back on her usual  medications for her diabetes and check her hemoglobin A1c to ensure good  control.      Altha Harm, MD     MAM/MEDQ  D:  03/01/2007  T:  03/01/2007  Job:  [id number]   cc:   Merlene Laughter. Renae Gloss, M.D.  Fax: [phone]

## 2010-06-23 NOTE — Consult Note (Signed)
NAME:  [name] [name]                 ACCOUNT NO.:  [id number]   MEDICAL RECORD NO.:  [id number]          PATIENT TYPE:  INP   LOCATION:  2925                         FACILITY:  [facility]   PHYSICIAN:  [name] [name], M.D.     DATE OF BIRTH:  01-13-1941   DATE OF CONSULTATION:  03/03/2007  DATE OF DISCHARGE:                                 CONSULTATION   REASON FOR CONSULTATION:  Two-vessel coronary disease with high-grade  complex to LAD and diagonal stenosis.   CLINICAL HISTORY:  I was asked by Dr. Elease [name] to evaluate [name] [name]  for consideration of coronary artery bypass graft surgery.  She is a 70-  year-old black female with no prior cardiac history who reports at least  a month history of poor energy level and shortness of breath.  She  reports developing substernal chest pain on Wednesday which she has  never had before.  This radiated up into her neck and jaw.  She  presented to the [facility] emergency room where her enzyme  levels were minimally elevated and electrocardiogram showed slight  inferior ST elevation.  Her pain resolved.  She was transferred to [facility] and underwent cardiac catheterization on March 01, 2007.  This  showed the left main to be normal.  The LAD was large with a 60%  proximal stenosis and then a ruptured plaque with about 80% stenosis  extending into the first diagonal branch.  The first diagonal had about  90% stenosis at its origin followed by 80% hazy stenosis.  Left  circumflex had no significant disease.  The right coronary had about 50%  proximal stenosis in a Shepherd's crook.  This was a large vessel.  The  left ventricular function was normal at 75%.  There was no gradient  across the aortic valve.  The patient has remained fairly stable but had  some chest pain today which was relieved with nitroglycerin.   REVIEW OF SYSTEMS:  GENERAL:  She denies any fever or chills.  She has  had no recent weight changes.  She does report  fatigue for at least a  month.  EYES:  Negative.  ENT:  Negative.  Endocrine she has adult onset  diabetes.  She denies hypothyroidism.  CARDIOVASCULAR:  As above.  She  denies PND or orthopnea.  She has had no peripheral edema or  palpitations.  RESPIRATORY:  She denies cough and sputum production.  She has had exertional dyspnea for a least a month which she thought was  due to asthma.  GI:  She denies nausea or vomiting.  She has had no  melena or bright red blood per rectum.  GU:  She denies dysuria and  hematuria.  MUSCULOSKELETAL:  She denies arthralgias and myalgias.  VASCULAR:  She denies claudication and phlebitis.  She has never had a  DVT.  NEUROLOGICAL:  She denies any history of stroke or TIA.  She  denies any amaurosis.  She does have some weakness and numbness in her  right hand since carpal tunnel surgery.  PSYCHIATRIC:  Negative.  ALLERGIES:  MORPHINE, CODEINE, AND SULFA.   PAST MEDICAL HISTORY:  1. Significant for type 2 diabetes.  2. History of hypertension.  3. History of Hyperlipidemia.  4. Status post bilateral rotator cuff repair in the past.  5. Status post right carpal tunnel repair.  6. Status post hysterectomy.  7. Status post resection of benign tumor from the right kidney in the      past.   FAMILY HISTORY:  Positive for diabetes and heart disease.  Her father  had myocardial infarction at 47.   MEDICATIONS:  Prior to admission:  1. Lisinopril 20 mg daily.  2. Aspirin 320 mg daily.  3. Insulin 75/25, 30 units in the morning and sliding scale with      dinner.   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  She has never  smoked and denies alcohol use.   PHYSICAL EXAMINATION:  VITAL SIGNS:  Blood pressure is 93/54, pulse 78  and regular, respiratory rate is 17 and unlabored.  GENERAL:  She is a well-developed black female in no distress.  HEENT:  Exam shows her to be normocephalic and atraumatic.  Pupils are  equal, reactive to light  accommodation.  Extraocular muscles are intact.  Throat is clear.  NECK:  Exam shows normal carotid pulses bilaterally.  No bruits.  There  is no adenopathy or thyromegaly.  CARDIAC:  Exam shows regular rate and rhythm with normal S1-S2.  There  is no murmur or gallop.  LUNGS:  Clear.  ABDOMEN:  Shows active bowel sounds.  Abdomen is soft and obese and  nontender.  No palpable masses or organomegaly.  EXTREMITIES:  Shows no peripheral edema.  Pedal pulses palpable  bilaterally.  Her skin is warm and dry.  NEUROLOGIC:  Exam shows be alert and oriented x3.  Motor and sensory  exams grossly normal.   STUDIES:  Carotid Doppler examination shows no evidence of internal  carotid artery stenosis.   LABORATORY DATA:  The remainder of laboratory examinations are pending.   IMPRESSION:  [name] [name] has two-vessel coronary disease with complex  high-grade LAD and diagonal stenosis with unstable anginal symptoms.  I  agree that coronary artery bypass graft surgery is the best treatment  for this patient given her anatomy and diabetes.  Percutaneous  intervention of her LAD lesions would likely result in occlusion of the  diagonal and septal perforator, and I think the likelihood having good  long-term results even if the LAD and diagonal can be opened  successfully would be low.  I discussed the operative procedure with the  patient and her granddaughter by telephone.  We discussed alternatives,  benefits, and risks including but not limited to bleeding, blood  transfusion, infection, stroke, myocardial infarction, graft failure,  and death.  They understand and elected to proceed with surgery.  We  will plan to do this on Monday morning.      [name] [name], M.D.  Electronically Signed     BB/MEDQ  D:  03/03/2007  T:  03/04/2007  Job:  [id number]   cc:   [name] [name], M.D.

## 2010-06-23 NOTE — Discharge Summary (Signed)
NAME:  [name], [name]                 ACCOUNT NO.:  [id number]   MEDICAL RECORD NO.:  [id number]          PATIENT TYPE:  INP   LOCATION:  2030                         FACILITY:  [facility]   PHYSICIAN:  Evelene Croon, M.D.     DATE OF BIRTH:  1940/06/17   DATE OF ADMISSION:  03/01/2007  DATE OF DISCHARGE:  03/12/2007                               DISCHARGE SUMMARY   ADDENDUM:  This is an addendum to the discharge summary dictated on  March 10, 2007.  The patient was essentially ready for discharge home  on March 11, 2007.  It was thought that she would benefit four more  days of cardiac rehab, to continue working on her strength.  She  remained stable during this time.  Her vital signs continue to be  monitored and were stable.  She remained off of oxygen and saturation  was 99%.  She remained in a normal sinus rhythm.  Her pulmonary status  remained stable.  Her incisions were clean, dry and intact and healing  well.   By March 12, 2007, the patient's activity level increased and she was  ready for discharge home.   FOLLOWUP/DISCHARGE INSTRUCTIONS:  For her follow-up appointment and  discharge instructions, please see the dictated discharge summary.   DISCHARGE MEDICATIONS:  1. Aspirin 325 mg daily.  2. Lopressor 50 mg twice daily.  3. Lisinopril 10 mg daily.  4. Crestor 10 mg daily.  5. Insulin as at home.  6. Continue sliding scale insulin as well.  7. Ventolin MDI two puffs q.6h. p.r.n.  8. Tylenol q.4-6h. p.r.n.      Theda Belfast, [place]      Evelene Croon, M.D.  Electronically Signed    KMD/MEDQ  D:  04/25/2007  T:  04/25/2007  Job:  [id number]   cc:   Evelene Croon, M.D.  Vesta Mixer, M.D.

## 2010-06-23 NOTE — Assessment & Plan Note (Signed)
OFFICE VISIT   [name], [name] L  DOB:  1940-03-26                                        March 28, 2007  CHART #:  [id number]   HISTORY:  [name] returned today for followup, status post coronary  artery bypass graft surgery x3 on March 06, 2007.  Her postoperative  course was uncomplicated.  Since discharge, she thinks she has been  feeling fairly well.  She has mild chest wall soreness.  She is walking  daily without chest pain or shortness of breath and is scheduled to be  in cardiac rehab soon.   PHYSICAL EXAMINATION:  VITAL SIGNS:  Blood pressure 134/81, pulse 78 and  regular, respirations 18 and unlabored.  GENERAL:  She looks well.  HEART:  A regular rate and rhythm with normal heart sounds.  LUNGS:  Exam is clear.  CHEST:  The chest incision is healing well and the sternum is stable.  EXTREMITIES:  Her leg incision is healing well and there is no lower  extremity edema.   Follow-up chest x-ray shows clear lung fields and no significant pleural  effusion.   CURRENT MEDICATIONS:  1. Aspirin 325 mg daily.  2. Lopressor 50 mg twice daily.  3. Crestor 10 mg daily.  4. Insulin 75/25, 30 units q.a.m.  5. Sliding scale insulin.  6. Ventolin inhaler p.r.n.  7. Tylenol p.r.n. pain.   IMPRESSION:  Overall [name] is recovering well following her  surgery.   PLAN:  I encouraged her to continue walking as much as possible.  She  will start cardiac rehab soon.  I told her she can return to driving a  car but should refrain from lifting anything heavier than 10 pounds for  a total of three months from the date of surgery.   FOLLOWUP:  She will continue to follow up with Dr. Deloris Ping. Nahser and  will contact me if she develops any problems with her incisions.   Evelene Croon, M.D.  Electronically Signed   BB/MEDQ  D:  03/28/2007  T:  03/29/2007  Job:  [id number]   cc:   Vesta Mixer, M.D.

## 2010-06-26 NOTE — Op Note (Signed)
[place]  Patient:    [name], GLENDENING Visit Number: [id number] MRN: [id number]          Service Type: END Location: ENDO Attending Physician:  Nelda Marseille Dictated by:   Petra Kuba, M.D. Proc. Date: 03/06/01 Admit Date:  03/06/2001   CC:         Barron Alvine, M.D.                           Operative Report  PROCEDURE:  Colonoscopy.  INDICATION:  Screening.  ENDOSCOPIST:  Petra Kuba, M.D.  Consent was signed after risks, benefits, methods, and options were thoroughly discussed in the office.  MEDICINES USED:  Demerol 100, Versed 10.  DESCRIPTION OF PROCEDURE:  Rectal inspection was pertinent for external hemorrhoids, small.  Digital exam was negative.  Pediatric video adjustable colonoscope was inserted and easily advanced around the colon to the cecum. No obvious abnormality was seen on insertion.  This did not require any abdominal pressure or any position changes.  The cecum was then identified by the appendiceal orifice and ileocecal valve.  Scope was slowly withdrawn.  The prep was adequate.  There was some liquid stool that required washing and suctioning.  In the ascending colon, a questionable tiny hyperplastic-appearing polyp was seen, one fold above the ileocecal valve, and was cold biopsied x1.  The scope was further withdrawn.  No additional findings were seen, until we withdrew around the sigmoid where two other tiny polyps, questionably hyperplastic, were seen and were cold biopsied x1 or 2, and put in the same container.  Once back in the rectum, the scope was retroflexed pertinent for some internal hemorrhoids.  The scope was drained and readvanced a third of the way up the left side of the colon.  Air was suctioned, and the scope was removed.  The patient tolerated the procedure well.  There was no obvious immediate complication.  ENDOSCOPIC DIAGNOSES: 1. Internal and external hemorrhoids. 2. Three  tiny questionable hyperplastic-appearing polyps in the    ascending and two in the sigmoid all cold biopsied. 3. Otherwise within normal limits to the cecum.  PLAN:  Await pathology, determine future colonic screening, otherwise yearly rectals and guaiacs per primary care.  Okay to proceed with surgical options by Dr. Isabel Caprice, and I will see her back p.r.n. or in six months. Dictated by:   Petra Kuba, M.D. Attending Physician:  Nelda Marseille DD:  03/06/01 TD:  03/06/01 Job: [id number]

## 2010-06-26 NOTE — Discharge Summary (Signed)
[facility]  Patient:    [name], [name] Visit Number: [id number] MRN: [id number]          Service Type: SUR Location: 3W 0362 01 Attending Physician:  Thermon Leyland Dictated by:   Barron Alvine, M.D. Admit Date:  03/29/2001 Discharge Date: 04/02/2001   CC:         Petra Kuba, M.D.  Merlene Laughter. Renae Gloss, M.D.   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 3. Postoperative ileus.  PROCEDURE:  Right flank exploration with right partial nephrectomy on March 29, 2001.  HISTORY OF PRESENT ILLNESS:  Ms. Yamamoto is a 70 year old female.  She was sent through the courtesy of Dr. Vida Rigger.  She has had some long-standing, nonspecific, right-sided abdominal pain.  She was noted several years ago to have a 3 cm lesion in the lower pole of her right kidney which was consistent with an angiomyolipoma.  Repeat imaging studies recently demonstrated growth of this lesion to 5 cm in size.  I explained to Ms. Paschen that it is really difficult to say whether her abdominal pain is secondary to this lesion or not, but given the increased size and the fact that this is now 5 cm, that the angiomyolipoma certainly has significantly increased risk of spontaneous hemorrhage.  Generally, for lesions this size, intervention is strongly considered.  She agreed to those recommendations.  HOSPITAL COURSE:  On March 29, 2001, she underwent an uncomplicated flank exploration with partial nephrectomy.  Blood loss was minimal.  Frozen section as well as permanent pathology revealed an angiomyolipoma without evidence of renal cell carcinoma.  The patients postoperative course was remarkable primarily for a bit of an ileus as well as some nausea and vomiting, probably secondary to the intravenous morphine.  Her postoperative hemoglobin was 10.5 and postoperative renal function included a BUN and  creatinine of 9 and 0.7 respectively.  It took approximately 48-72 hours for the patients nausea to resolve.  Pain medication needed to be changed on several occasions to try and keep things under better control.  She was managed with a sliding scale for insulin and her blood sugars were generally in the 150-200 range.  She was discharged on postop day #4.  At that time, she was afebrile with normal vital signs.  Her blood glucose was approximately 150 and she was back on her Actos as well as her Lantus insulin.  Her incision looks good.  She was tolerating a general diet reasonably well.  She has had a bowel movement and has been ambulating.  DISPOSITION:  The patient will be discharged to home.  DISCHARGE MEDICATIONS: 1. Phenergan. 2. Tylox.  FOLLOWUP:  She will be seen in our office for staple removal in another week and will be seen by myself in approximately two weeks. Dictated by:   Barron Alvine, M.D. Attending Physician:  Thermon Leyland DD:  04/02/01 TD:  04/03/01 Job: [id number]

## 2010-06-26 NOTE — Op Note (Signed)
NAME:  [name], [name]                       ACCOUNT NO.:  [id number]   MEDICAL RECORD NO.:  [id number]                   PATIENT TYPE:  AMB   LOCATION:  DAY                                  FACILITY[facility]  [facility]   PHYSICIAN:  Ronald A. Darrelyn Hillock, M.D.             DATE OF BIRTH:  [birth year]   DATE OF PROCEDURE:  08/02/2003  DATE OF DISCHARGE:                                 OPERATIVE REPORT   SURGEON:  Georges Lynch. Darrelyn Hillock, M.D.   ASSISTANT:  Nurse.   PREOPERATIVE DIAGNOSES:  1. Severe impingement syndrome, left shoulder.  2. Tear of the rotator cuff tendon, left shoulder.   POSTOPERATIVE DIAGNOSES:  1. Severe impingement syndrome, left shoulder.  2. Tear of the rotator cuff tendon, left shoulder.   OPERATION:  1. Open decompression by performing a partial acromionectomy, left shoulder.  2. Repair of a small hole with a primary repair in the rotator cuff tendon,     left shoulder.   DESCRIPTION OF PROCEDURE:  The patient first had an interscalene nerve block  prior to being brought back to surgery.  Once back in the operating room,  she had a general anesthetic and a sterile prep and draping of the left  shoulder was carried out. At this time, an incision was made over the  anterior aspect of the left shoulder. Bleeders identified and cauterized.  Following that, I then detached the deltoid tendon by sharp dissection to  the acromion.  I then noted that she had severe impingement syndrome.  The  acromion actually wore a hole down in the rotator cuff, it was a small hole.  This was distal at its insertion. I then protected the remaining cuff, I  excised the subdeltoid bursa. I then went on and a did a partial  acromionectomy and acromioplasty utilizing the oscillating saw and the bur.  Once this was completed, I then thoroughly irrigated out the area.  I  removed the remaining part of the subdeltoid bursa.  I went down and by  primary suture repaired a small hole in the rotator cuff  tendon.  No graft  was necessary. I thoroughly irrigated out the area.  I inserted some Gelfoam  up in the subacromial space for hemostasis purposes.  I reapproximated the  deltoid tendon and muscle in the usual fashion, the subcu was closed with #0  Vicryl. The skin was closed with metal staples.  A sterile Neosporin  dressing was applied. She was placed in a sling and returned to the recovery  room. She had 1 g of IV Ancef preop.                                               Ronald A. Darrelyn Hillock, M.D.    RAG/MEDQ  D:  08/02/2003  T:  08/02/2003  Job:  [id number]

## 2010-06-26 NOTE — Op Note (Signed)
[facility]  Patient:    [name], [name] Visit Number: [id number] MRN: [id number]          Service Type: END Location: ENDO Attending Physician:  Nelda Marseille Dictated by:   Barron Alvine, M.D. Proc. Date: 03/29/01 Admit Date:  03/06/2001 Discharge Date: 03/06/2001   CC:         Petra Kuba, M.D.                           Operative Report  PREOPERATIVE DIAGNOSIS:  Right renal mass, probable angiomyolipoma.  POSTOPERATIVE DIAGNOSIS:  Right renal mass, probable angiomyolipoma.  PROCEDURE PERFORMED:  Right flank exploration with right partial nephrectomy.  SURGEON:  Barron Alvine, M.D.  ASSISTANT:  [name], M.D.  INDICATIONS:  Ms. Filyaw is a 70 year old female.  She has been known for several years to have a small exophytic mass off the lower pole of her right kidney.  Ultrasound and CT imaging have been completely consistent with an angiomyolipoma.  She has had some nonspecific abdominal complaints, but it is not clear whether this lesion is really symptomatic or not.  It has increased from 3 to 5 cm over the last two years, and therefore is at increased risk for spontaneous hemorrhage.  We discussed the possible treatment options with the patient including continued observation with serial imaging, angioinfarction, or possible partial nephrectomy.  The patient now presents for partial nephrectomy.  Full and informed consent was obtained.  TECHNIQUE AND FINDINGS:  The patient was brought to the operating room where she had successful induction of general anesthesia.  Foley catheter was placed, and then she was placed in the standard flank position.  Great care was utilized in making sure that the axilla and the lower extremities as well as the upper extremities were all padded appropriately.  The kidney rest was used as well as flexion on the bed.  A standard flank incision was performed off the tip of the 12th rib, and no rib  was actually taken.  The retroperitoneal plane was entered, and Gerotas fascia was dissected off the posterior body wall.  The anterior aspect of Gerotas was dissected off the perineum.  One could palpate the lesion in exophytic manner off the lower pole of the left kidney.  The gonadal and ureter were identified to keep from injuring these structures.  Once the lower pole was completely isolated, we opened Gerotas fascia around this lesion to carefully inspect it.  This was approximately a 5 cm exophytic lesion, coming right off the lower pole of the kidney.  We did not feel that vascular access/control was really necessary given the extreme exophytic nature of this lesion.  Once we had freed Gerotas fascia off this, we scored the renal capsule just a few millimeters away from the actual lesion.  We were then able to transect this without significant difficulty.  Parenchymal oozing was handled with an argon beam coagulator.  We then used a fibrin patch with some Gelfoam.  Hemostasis was excellent, and total blood loss was about 75 cc.  We then reapproximated the perinephric fat that Gerotas fascia overlying this fibrin glue plug.  Again, we got very superficially into the renal parenchyma, and there was no need for drainage in my opinion.  The wound was all copiously irrigated and then infiltrated with Marcaine.  It was then closed with PDS in a running manner with three layers anteriorly and two posteriorly.  The skin was closed with clips.  The patient was brought to the recovery room in stable condition.  All sponge and needle counts were correct. Dictated by:   Barron Alvine, M.D. Attending Physician:  Nelda Marseille DD:  03/29/01 TD:  03/29/01 Job: [id number]

## 2010-06-26 NOTE — Op Note (Signed)
NAME:  [name], [name]                       ACCOUNT NO.:  [id number]   MEDICAL RECORD NO.:  [id number]                   PATIENT TYPE:  AMB   LOCATION:  DSC                                  FACILITY:  [facility]   PHYSICIAN:  Hewitt Blade, D.D.S.             DATE OF BIRTH:  06/23/1940   DATE OF PROCEDURE:  03/13/2002  DATE OF DISCHARGE:                                 OPERATIVE REPORT   PREOPERATIVE DIAGNOSIS:  Nonrestorable teeth numbers 17, 21, 23, 24, 25, 26.  History of hypertension, history of asthma, history of insulin dependent  diabetes.   POSTOPERATIVE DIAGNOSIS:  Nonrestorable teeth numbers 17, 21, 23, 24, 25,  26.  History of hypertension, history of asthma, history of insulin  dependent diabetes.   OPERATION PERFORMED:  Removal of the above teeth.   SURGEON:  Hewitt Blade, D.D.S.   ASSISTANT:  [place].   ANESTHESIA:  General via oral endotracheal intubation.   ESTIMATED BLOOD LOSS:  Negligible.   FLUIDS REPLACED:  Approximately 1000 cc crystalloid solutions.   COMPLICATIONS:  None apparent.   INDICATIONS FOR PROCEDURE:  The patient is a 70 year old female who was  referred to my office for removal of the above teeth.  The patient presented  with a history of diabetes, hypertension, and asthma and due to her medical  history, it was recommended that the teeth be removed under operating room  conditions where the physical status of the patient could be closely  monitored.   DESCRIPTION OF PROCEDURE:  On March 13, 2002, the patient was taken to  the [facility].  She was placed on the operating table in  a supine position.  Following successful oral intubation and general  anesthesia, the patient's face, neck and oral cavity were prepped and draped  in the usual sterile operating room fashion.  The hypopharynx was suctioned  free of fluids and secretions and a moistened two-inch vaginal pack was  placed as a throat pack.  Attention  was then turned intraorally, where  approximately 5 cc of 0.5% Xylocaine containing 1:200,000 epinephrine were  infiltrated in the left inferior alveolar nerve distribution and the buccal  lingual soft tissues of the anterior mandible.  A #9 Molt periosteal  elevator was then used to reflect a full thickness mucoperiosteal flap  around the indicated teeth.  The teeth were then subluxated from the  alveolus using an 11A elevator and removed from the oral cavity using a 151  dental forcep.  The bony margins were then smoothed and contoured with a  small osseous file.  The area was then thoroughly irrigated and suctioned.  The previously constructed immediate mandibular denture was placed.  The  throat pack was removed, the hypopharynx suctioned  free of fluids and secretions.  Mr. Gelardi was allowed to awaken from the  anesthesia and taken to the recovery room where she tolerated the procedure  nicely without apparently complications.  Hewitt Blade, D.D.S.    DC/MEDQ  D:  03/13/2002  T:  03/13/2002  Job:  [id number]

## 2010-06-26 NOTE — H&P (Signed)
[facility]  Patient:    [name], [name] Visit Number: [id number] MRN: [id number]          Service Type: END Location: ENDO Attending Physician:  Nelda Marseille Dictated by:   Barron Alvine, M.D. Admit Date:  03/06/2001 Discharge Date: 03/06/2001                           History and Physical  CHIEF COMPLAINT:  Right renal mass.  HISTORY OF PRESENT ILLNESS:  [name] is a 70 year old female.  She has had a number of gastrointestinal abnormalities and some nonspecific right-sided abdominal pain.  She had an abdominal ultrasound performed two-and-a-half to three years ago which showed a 3 cm lesion in the lower pole of her right kidney which was thought to be an angiomyolipoma.  She has subsequently had repeat imaging studies and now the lesion has become significantly larger.  It is now 5 cm in size.  It explained to the patient that given the size of her angiomyolipoma it certainly had increased risk for spontaneous hemorrhage.  We talked about options for management and the risks and benefits of surgical exploration with removal of this lesion.  After extensive discussion, we elected to proceed with flank exploration and she presents now for surgery and routine postoperative care.  PAST MEDICAL HISTORY:  Significant for insulin-requiring diabetes mellitus. She is also on Actos.  She will occasionally have some reactive airway disease and has been on albuterol in the past.  She has approximately a 10-pack-year smoking history, but quit many years ago.  REVIEW OF SYSTEMS:  Otherwise negative.  PHYSICAL EXAMINATION:  She is a well-developed, well-nourished female.  She is moderately obese with a weight of a little over 200 pounds.  VITAL SIGNS:  Her blood pressure is 136/96 with a pulse of 76.  She is afebrile.  ABDOMEN:  Obese, but soft without obvious palpable masses.  CHEST:  Clear to auscultation.  NECK:  Without JVD or  masses.  EXTREMITIES:  Without edema.  LABORATORY DATA:  The preoperative hemoglobin was 11.7.  Preoperative renal function was normal with a BUN and creatinine of 16 and 0.7, respectively.  ASSESSMENT:  A 5 cm right renal mass.  This was exophytic off of the lower pole of the right kidney.  This was consistent on imaging studies with an angiomyolipoma.  It has increased several centimeters in size over the last two years and does have an increased risk of spontaneous hemorrhage.  The patient will present with flank exploration and probable partial nephrectomy. Dictated by:   Barron Alvine, M.D. Attending Physician:  Nelda Marseille DD:  03/29/01 TD:  03/29/01 Job: [id number]

## 2010-06-26 NOTE — Procedures (Signed)
[facility]  Patient:    [name], [name]                        MRN: [id number] Proc. Date: 05/04/00 Adm. Date:  [id number] Attending:  Nelda Marseille CC:         Merlene Laughter. Renae Gloss, M.D.                           Procedure Report  PROCEDURE:  Esophagogastroduodenoscopy with biopsy.  INDICATIONS FOR PROCEDURE:  A patient with upper tract symptoms not responding to the usual medicines, also with abnormal upper GI.  Consent was signed after risks, benefits, methods, and options were thoroughly discussed in the office.  MEDICINES USED:  Demerol 60, Versed 6.  DESCRIPTION OF PROCEDURE:  The video endoscope was inserted by direct vision, the esophagus was normal. There was on signs of Barretts or significant esophagitis. She did have a small hiatal hernia. The scope passed into the stomach where some mild gastritis was seen and advanced to the antrum where a submucosal smooth nodule was seen. The pylorus was normal. The scope passed easily into the stomach and advanced through a normal duodenal bulb around the C loop to a normal second portion of the duodenum. The scope was withdrawn back to the bulb and a good look there ruled out ulcers in that location. The scope was withdrawn back to the stomach and retroflexed. The angularis, cardia, fundus, lesser and greater curve were normal except for some minimal gastritis. The scope was straightened and straight visualization of the stomach confirmed the above findings. No additional findings were seen. The scope was then slowly withdrawn back to 20 cm. No additional findings were seen. The scope was then advanced to the antrum and multiple biopsies of this lesion was seen. It was smooth and movable and soft and probably all we biopsied was the overlying mucosa. After multiple biopsies were obtained, the air was suctioned, the scope removed. Again a good look at the esophagus confirmed the above findings.  The scope was withdrawn. The patient tolerated the procedure well. There was no obvious or immediate complications.  ENDOSCOPIC DIAGNOSIS: 1. Small hiatal hernia. 2. Minimal gastritis. 3. 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 4. Otherwise within normal limits EGD.  PLAN:  Await pathology. Double her Aciphex. Consider adding Reglan next. Consider a CT scan or endoscopic ultrasound if we want to continue workup for this submucosal lesion and otherwise have her call me p.r.n. or follow-up in six weeks to recheck symptoms and decide how to proceed. DD:  05/04/00 TD:  05/04/00 Job: [id number]

## 2010-07-08 ENCOUNTER — Other Ambulatory Visit: Payer: Self-pay | Admitting: Critical Care Medicine

## 2010-08-10 ENCOUNTER — Telehealth: Payer: Self-pay | Admitting: Cardiovascular Disease

## 2010-08-10 NOTE — Telephone Encounter (Signed)
Pt wants samples of bistolic? Please call if we have any

## 2010-08-10 NOTE — Telephone Encounter (Signed)
Pt returned phone call to clarify dosage of bystolic. Pt confirms she is taking 1/2 of 10mg  tab. Informed her samples available. She will pick them up.

## 2010-08-13 ENCOUNTER — Other Ambulatory Visit: Payer: Self-pay | Admitting: Cardiovascular Disease

## 2010-08-13 NOTE — Telephone Encounter (Signed)
Fax received from pharmacy. Refill completed. Jodette Jilliam Bellmore RN  

## 2010-08-20 ENCOUNTER — Encounter: Payer: Self-pay | Admitting: Cardiovascular Disease

## 2010-08-27 ENCOUNTER — Encounter: Payer: Self-pay | Admitting: Cardiovascular Disease

## 2010-08-27 ENCOUNTER — Ambulatory Visit (INDEPENDENT_AMBULATORY_CARE_PROVIDER_SITE_OTHER): Payer: Medicare Other | Admitting: Cardiovascular Disease

## 2010-08-27 VITALS — BP 164/90 | HR 80 | Ht 63.0 in | Wt 224.2 lb

## 2010-08-27 DIAGNOSIS — I2510 Atherosclerotic heart disease of native coronary artery without angina pectoris: Secondary | ICD-10-CM | POA: Insufficient documentation

## 2010-08-27 MED ORDER — DOXAZOSIN MESYLATE 8 MG PO TABS: 8.0000 mg | ORAL_TABLET | Freq: Every day | ORAL | Status: DC

## 2010-08-27 NOTE — Progress Notes (Signed)
[name] Date of Birth  1940-07-27 [facility] / [facility] [address]     [address] [place], [place]  [address] [phone]  Fax  [phone]  History of Present Illness:  Pt complains of extreme fatigue and HTN today.  Has not felt well for the past month.  Will be seeing Dr. Renae Gloss.  She denies any chest pain. She denies any worsening of her shortness of breath.  Current Outpatient Prescriptions on File Prior to Visit  Medication Sig Dispense Refill  . acetaminophen (TYLENOL) 325 MG tablet Take 650 mg by mouth every 6 (six) hours as needed.        . ALPRAZolam (XANAX) 0.25 MG tablet Take 0.25 mg by mouth daily.        Marland Kitchen aspirin 325 MG tablet Take 325 mg by mouth daily.        . Cholecalciferol (VITAMIN D) 2000 UNITS tablet Take 2,000 Units by mouth daily.        Marland Kitchen doxazosin (CARDURA) 4 MG tablet TAKE 1/2 TABLET AT BEDTIME (INCREASE TO 1 TABLET ONLY WHEN DIRECTED BY DR_)  30 tablet  5  . Fluticasone-Salmeterol (ADVAIR HFA IN) Inhale into the lungs as needed.        . levalbuterol (XOPENEX HFA) 45 MCG/ACT inhaler Inhale 1-2 puffs into the lungs every 4 (four) hours as needed.        Marland Kitchen losartan (COZAAR) 100 MG tablet TAKE 1 TABLET BY MOUTH DAILY  30 tablet  3  . magnesium gluconate (MAGONATE) 500 MG tablet Take 500 mg by mouth daily.        . metFORMIN (GLUCOPHAGE) 500 MG tablet Take 500 mg by mouth 2 (two) times daily with a meal.        . Multiple Vitamin (MULTIVITAMIN) tablet Take 1 tablet by mouth daily.        . nebivolol (BYSTOLIC) 10 MG tablet Take 5 mg by mouth daily.        . nitroGLYCERIN (NITROSTAT) 0.4 MG SL tablet Place 0.4 mg under the tongue every 5 (five) minutes as needed.        . phentermine 15 MG capsule Take 15 mg by mouth every morning.        Marland Kitchen POTASSIUM PO Take by mouth daily.        . ranolazine (RANEXA) 500 MG 12 hr tablet Take 500 mg by mouth 2 (two) times daily.          Allergies  Allergen Reactions  . Amlodipine     . Codeine   . Morphine   . Sulfonamide Derivatives     REACTION: swelling    Past Medical History  Diagnosis Date  . Coronary artery disease   . Diabetes mellitus   . Hypertension   . Hyperlipidemia   . Shoulder injury   . Chest pain     Past Surgical History  Procedure Date  . Cardiac catheterization 07/23/2009    EF 60%  . Cardiac catheterization 10/11/2008  . Cardiac catheterization 03/01/2007    EF 75-80%  . Coronary artery bypass graft     SEVERELY DISEASED SAPHENOUS VEIN GRAFT TO THE RIGHT CORONARY ARTERY BUT WITH FAIRLY WELL PRESERVED FLOW TO THE DISTAL RIGHT CORONARY ARTERY FROM THE NATIVE CIRCULATION-RESTART  CATH IN JUNE 2000, REVEALS MILD/MODERATE  CAD WITH GOOD FLOW DOWN HER LAD  . US echocardiography 03/08/2008    EF 55-60%  . Cardiovascular stress test 11/15/2007    EF  60%    History  Smoking status  . Former Smoker  . Quit date: 08/20/1982  Smokeless tobacco  . Not on file    History  Alcohol Use No    No family history on file.  Reviw of Systems:  Reviewed in the HPI.  All other systems are negative.  Physical Exam: BP 164/90  Pulse 80  Ht 5\' 3"  (1.6 m)  Wt 224 lb 3.2 oz (101.696 kg)  BMI 39.72 kg/m2 The patient is alert and oriented x 3.  The mood and affect are normal.   Skin: warm and dry.  Color is normal.    HEENT:   the sclera are nonicteric.  The mucous membranes are moist.  The carotids are 2+ without bruits.  There is no thyromegaly.  There is no JVD.    Lungs: clear.  The chest wall is non tender.    Heart: regular rate with a normal S1 and S2.  There are no murmurs, gallops, or rubs. The PMI is not displaced.     Abdomin: good bowel sounds.  There is no guarding or rebound.  There is no hepatosplenomegaly or tenderness.  There are no masses.   Extremities:  no clubbing, cyanosis, or edema.  The legs are without rashes.  The distal pulses are intact.   Neuro:  Cranial nerves II - XII are intact.  Motor and sensory functions are  intact.    The gait is normal.  Assessment / Plan:

## 2010-08-27 NOTE — Assessment & Plan Note (Signed)
[name] remains fairly stable. We'll continue with her same medications. I do not think that her fatigue is due to coronary ischemia. We will let her work through this with her medical doctor.

## 2010-08-27 NOTE — Progress Notes (Signed)
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

## 2010-09-06 ENCOUNTER — Other Ambulatory Visit: Payer: Self-pay | Admitting: Critical Care Medicine

## 2010-10-29 LAB — CBC
HCT: 31.2 — ABNORMAL LOW
HCT: 32.5 — ABNORMAL LOW
HCT: 33.5 — ABNORMAL LOW
HCT: 36.3
Hemoglobin: 10.7 — ABNORMAL LOW
Hemoglobin: 10.8 — ABNORMAL LOW
Hemoglobin: 11.9 — ABNORMAL LOW
Hemoglobin: 12.4
MCHC: 32.8
MCHC: 32.8
MCHC: 33.1
MCHC: 33.2
MCHC: 33.4
MCHC: 33.6
MCV: 78.5
MCV: 81.3
MCV: 82
MCV: 82.6
Platelets: 167
Platelets: 258
RBC: 3.84 — ABNORMAL LOW
RBC: 3.88
RBC: 3.9
RBC: 4.43
RDW: 16 — ABNORMAL HIGH
RDW: 16.1 — ABNORMAL HIGH
RDW: 16.6 — ABNORMAL HIGH
RDW: 16.6 — ABNORMAL HIGH
RDW: 17 — ABNORMAL HIGH
WBC: 13.5 — ABNORMAL HIGH
WBC: 16.3 — ABNORMAL HIGH

## 2010-10-29 LAB — URINALYSIS, ROUTINE W REFLEX MICROSCOPIC
Glucose, UA: NEGATIVE
Ketones, ur: 15 — AB
Nitrite: POSITIVE — AB
Protein, ur: 100 — AB

## 2010-10-29 LAB — TYPE AND SCREEN: ABO/RH(D): A POS

## 2010-10-29 LAB — MAGNESIUM
Magnesium: 2
Magnesium: 2.3

## 2010-10-29 LAB — DIFFERENTIAL
Eosinophils Absolute: 0.1
Lymphocytes Relative: 18
Lymphs Abs: 1.3
Monocytes Relative: 5
Neutro Abs: 5.5
Neutrophils Relative %: 75

## 2010-10-29 LAB — POCT I-STAT 3, ART BLOOD GAS (G3+)
Acid-Base Excess: 3 — ABNORMAL HIGH
Acid-Base Excess: 7 — ABNORMAL HIGH
Bicarbonate: 29.4 — ABNORMAL HIGH
O2 Saturation: 100
O2 Saturation: 96
O2 Saturation: 98
Operator id: 193041
Operator id: 252761
Patient temperature: 36
Patient temperature: 38.1
TCO2: 28
TCO2: 34
pCO2 arterial: 50.7 — ABNORMAL HIGH
pH, Arterial: 7.39
pH, Arterial: 7.391

## 2010-10-29 LAB — URINE CULTURE: Colony Count: >=100000

## 2010-10-29 LAB — BASIC METABOLIC PANEL
BUN: 9
CO2: 29
CO2: 31
CO2: 35 — ABNORMAL HIGH
Calcium: 7.9 — ABNORMAL LOW
Calcium: 8.3 — ABNORMAL LOW
Calcium: 8.4
Calcium: 8.4
Chloride: 100
Creatinine, Ser: 0.61
Creatinine, Ser: 0.71
Creatinine, Ser: 0.75
GFR calc Af Amer: >60
GFR calc Af Amer: >60
GFR calc Af Amer: >60
GFR calc non Af Amer: >60
GFR calc non Af Amer: >60
Glucose, Bld: 108 — ABNORMAL HIGH
Glucose, Bld: 134 — ABNORMAL HIGH
Glucose, Bld: 199 — ABNORMAL HIGH
Potassium: 3.5
Sodium: 140
Sodium: 141

## 2010-10-29 LAB — BLOOD GAS, ARTERIAL
Acid-Base Excess: 7 — ABNORMAL HIGH
Bicarbonate: 32.6 — ABNORMAL HIGH
O2 Saturation: 98.4
pO2, Arterial: 127 — ABNORMAL HIGH

## 2010-10-29 LAB — POCT I-STAT 4, (NA,K, GLUC, HGB,HCT)
Glucose, Bld: 151 — ABNORMAL HIGH
Glucose, Bld: 152 — ABNORMAL HIGH
Glucose, Bld: 168 — ABNORMAL HIGH
Glucose, Bld: 189 — ABNORMAL HIGH
Glucose, Bld: 199 — ABNORMAL HIGH
HCT: 20 — ABNORMAL LOW
HCT: 22 — ABNORMAL LOW
HCT: 31 — ABNORMAL LOW
Hemoglobin: 10.5 — ABNORMAL LOW
Hemoglobin: 6.8 — CL
Hemoglobin: 7.5 — CL
Hemoglobin: 8.5 — ABNORMAL LOW
Operator id: 252761
Operator id: 3291
Operator id: 3291
Operator id: 3291
Operator id: 3291
Potassium: 3.9
Potassium: 4.3
Potassium: 4.6
Potassium: 5.7 — ABNORMAL HIGH
Sodium: 130 — ABNORMAL LOW
Sodium: 131 — ABNORMAL LOW
Sodium: 138

## 2010-10-29 LAB — LIPID PANEL
HDL: 28 — ABNORMAL LOW
Triglycerides: 117
VLDL: 19
VLDL: 23

## 2010-10-29 LAB — TROPONIN I: Troponin I: 0.12 — ABNORMAL HIGH

## 2010-10-29 LAB — POCT I-STAT 3, VENOUS BLOOD GAS (G3P V)
Acid-Base Excess: 4 — ABNORMAL HIGH
Operator id: 3291
pH, Ven: 7.376 — ABNORMAL HIGH

## 2010-10-29 LAB — I-STAT EC8
BUN: 8
Bicarbonate: 26.9 — ABNORMAL HIGH
Chloride: 104
Glucose, Bld: 175 — ABNORMAL HIGH
Hemoglobin: 12.9
Operator id: 193041
Potassium: 4.9
Sodium: 139

## 2010-10-29 LAB — CK TOTAL AND CKMB (NOT AT ARMC)
Relative Index: 3 — ABNORMAL HIGH
Relative Index: 6 — ABNORMAL HIGH
Relative Index: 7 — ABNORMAL HIGH

## 2010-10-29 LAB — POCT CARDIAC MARKERS
CKMB, poc: 2.3
Myoglobin, poc: 52.4
Myoglobin, poc: 78.1
Operator id: 3067
Operator id: 3067
Troponin i, poc: <0.05

## 2010-10-29 LAB — PROTIME-INR
Prothrombin Time: 13.8
Prothrombin Time: 15.9 — ABNORMAL HIGH

## 2010-10-29 LAB — COMPREHENSIVE METABOLIC PANEL
ALT: 14
CO2: 28
Calcium: 8.8
Creatinine, Ser: 0.69
GFR calc non Af Amer: >60
Glucose, Bld: 222 — ABNORMAL HIGH
Sodium: 139
Total Protein: 7

## 2010-10-29 LAB — ABO/RH: ABO/RH(D): A POS

## 2010-10-29 LAB — PHOSPHORUS: Phosphorus: 3.7

## 2010-10-29 LAB — CARDIAC PANEL(CRET KIN+CKTOT+MB+TROPI)
CK, MB: 0.9
Relative Index: INVALID
Total CK: 46
Total CK: 47

## 2010-10-29 LAB — APTT: aPTT: 42 — ABNORMAL HIGH

## 2010-10-29 LAB — HEMOGLOBIN AND HEMATOCRIT, BLOOD: Hemoglobin: 5.6 — CL

## 2010-10-29 LAB — URINE MICROSCOPIC-ADD ON

## 2010-10-29 LAB — CREATININE, SERUM: Creatinine, Ser: 0.67

## 2010-10-29 LAB — HEMOGLOBIN A1C
Hgb A1c MFr Bld: 6.9 — ABNORMAL HIGH
Hgb A1c MFr Bld: 7 — ABNORMAL HIGH
Mean Plasma Glucose: 168
Mean Plasma Glucose: 172

## 2010-10-29 LAB — PLATELET COUNT: Platelets: 146 — ABNORMAL LOW

## 2010-10-29 LAB — CALCIUM: Calcium: 8.8

## 2010-11-26 ENCOUNTER — Telehealth: Payer: Self-pay | Admitting: Cardiovascular Disease

## 2010-11-26 NOTE — Telephone Encounter (Signed)
Pt calling wanting to know if she could come pick up some samples of bystolic. Please return pt call to discuss further.

## 2010-11-26 NOTE — Telephone Encounter (Signed)
Pt called, one month of samples provided

## 2010-12-11 ENCOUNTER — Other Ambulatory Visit: Payer: Self-pay | Admitting: Critical Care Medicine

## 2010-12-22 ENCOUNTER — Ambulatory Visit (INDEPENDENT_AMBULATORY_CARE_PROVIDER_SITE_OTHER): Payer: Medicare Other | Admitting: Cardiovascular Disease

## 2010-12-22 ENCOUNTER — Encounter: Payer: Self-pay | Admitting: Cardiovascular Disease

## 2010-12-22 VITALS — BP 132/80 | HR 74 | Ht 63.0 in | Wt 225.0 lb

## 2010-12-22 DIAGNOSIS — I219 Acute myocardial infarction, unspecified: Secondary | ICD-10-CM

## 2010-12-22 DIAGNOSIS — R079 Chest pain, unspecified: Secondary | ICD-10-CM

## 2010-12-22 DIAGNOSIS — I2510 Atherosclerotic heart disease of native coronary artery without angina pectoris: Secondary | ICD-10-CM

## 2010-12-22 MED ORDER — ISOSORBIDE MONONITRATE ER 60 MG PO TB24: 60.0000 mg | ORAL_TABLET | ORAL | Status: DC

## 2010-12-22 MED ORDER — OMEPRAZOLE 20 MG PO CPDR: 20.0000 mg | DELAYED_RELEASE_CAPSULE | Freq: Every day | ORAL | Status: DC

## 2010-12-22 NOTE — Patient Instructions (Signed)
Your physician wants you to follow-up in: 3 months You will receive a reminder letter in the mail two months in advance. If you don't receive a letter, please call our office to schedule the follow-up appointment.   Your physician has requested that you have a lexiscan myoview. For further information please visit [email or web address] Please follow instruction sheet, as given.  Your physician has recommended you make the following change in your medication:   1) START Isosorbide 60 mg one tablet daily.

## 2010-12-22 NOTE — Progress Notes (Signed)
[name] Date of Birth  1940-02-20 [facility] [address]    [address] [place], [place]  [address] [phone]  Fax  [phone]  History of Present Illness:  [name] is a 70 year old female with a history of coronary artery disease. She status post coronary artery bypass grafting. In 2008. Repeat heart catheterization in September, 2010 and  June 2011 revealed only mild to moderate coronary artery irregularities. She recently had shoulder surgery and did fairly well with her shoulder surgery  Last cardiac catheterization was in June of 2011.  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    The left circumflex artery is fairly large with only minor luminal irregularities.  The right coronary artery has a 40% stenosis in the proximal vessel. The saphenous vein graft to right coronary artery is functionally occluded.  Saphenous vein graft to the first diagonal artery is a very large graft.   She presents today with some worsening episodes of chest discomfort. These episodes are similar to her previous episodes of angina that she had prior to her bypass grafting.  She has episodes of chest pain at rest. It typically worsens when she moves around. She also has significant shortness of breath with exertion.  Current Outpatient Prescriptions on File Prior to Visit  Medication Sig Dispense Refill  . acetaminophen (TYLENOL) 325 MG tablet Take 650 mg by mouth every 6 (six) hours as needed.        . ALPRAZolam (XANAX) 0.25 MG tablet Take 0.25 mg by mouth daily.        Marland Kitchen aspirin 325 MG tablet Take 325 mg by mouth daily.        . Cholecalciferol (VITAMIN D) 2000 UNITS tablet Take 2,000 Units by mouth daily.        Marland Kitchen doxazosin (CARDURA) 8 MG tablet Take 1 tablet (8 mg total) by mouth at bedtime.  30 tablet  5  . Fluticasone-Salmeterol (ADVAIR HFA IN) Inhale into the lungs as needed.        .  levalbuterol (XOPENEX HFA) 45 MCG/ACT inhaler Inhale 1-2 puffs into the lungs every 4 (four) hours as needed.        Marland Kitchen losartan (COZAAR) 100 MG tablet TAKE 1 TABLET BY MOUTH DAILY  30 tablet  2  . metFORMIN (GLUCOPHAGE) 500 MG tablet Take 500 mg by mouth 2 (two) times daily with a meal.        . Multiple Vitamin (MULTIVITAMIN) tablet Take 1 tablet by mouth daily.        . nebivolol (BYSTOLIC) 10 MG tablet Take 5 mg by mouth daily.        . nitroGLYCERIN (NITROSTAT) 0.4 MG SL tablet Place 0.4 mg under the tongue every 5 (five) minutes as needed.        . phentermine 15 MG capsule Take 15 mg by mouth every morning.          Allergies  Allergen Reactions  . Amlodipine   . Codeine   . Morphine   . Sulfonamide Derivatives     REACTION: swelling    Past Medical History  Diagnosis Date  . Coronary artery disease   . Diabetes mellitus   . Hypertension   . Hyperlipidemia   . Shoulder injury   . Chest pain     Past Surgical History  Procedure Date  . Cardiac catheterization 07/23/2009    EF 60%  .  Cardiac catheterization 10/11/2008  . Cardiac catheterization 03/01/2007    EF 75-80%  . Coronary artery bypass graft     SEVERELY DISEASED SAPHENOUS VEIN GRAFT TO THE RIGHT CORONARY ARTERY BUT WITH FAIRLY WELL PRESERVED FLOW TO THE DISTAL RIGHT CORONARY ARTERY FROM THE NATIVE CIRCULATION-RESTART  CATH IN JUNE 2000, REVEALS MILD/MODERATE  CAD WITH GOOD FLOW DOWN HER LAD  . US echocardiography 03/08/2008    EF 55-60%  . Cardiovascular stress test 11/15/2007    EF 60%    History  Smoking status  . Former Smoker  . Quit date: 08/20/1982  Smokeless tobacco  . Not on file    History  Alcohol Use No    No family history on file.  Reviw of Systems:  Reviewed in the HPI.  All other systems are negative.  Physical Exam: BP 132/80  Pulse 74  Ht 5\' 3"  (1.6 m)  Wt 225 lb (102.059 kg)  BMI 39.86 kg/m2 The patient is alert and oriented x 3.  The mood and affect are normal.   Skin: warm  and dry.  Color is normal.    HEENT:   Normocephalic/atraumatic. She has no JVD. The carotids are normal.  Lungs: Lungs are clear   Heart: Regular rate S1-S2. She has no gallops.    Abdomen: Abdominal exam is benign.  Extremities:  No clubbing cyanosis or edema.  She is very long fingernails  Neuro:  Nonfocal  ECG: Normal sinus rhythm. She has a right bundle branch block.  Assessment / Plan:

## 2010-12-22 NOTE — Assessment & Plan Note (Addendum)
Ms. Denault continues to have episodes of chest discomfort. She states that her symptoms are very similar to the one she was having before her bypass grafting. We have performed 2 cardiac catheterizations since her bypass grafting.   Last cardiac catheterization was in June of 2011.  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    The left circumflex artery is fairly large with only minor luminal irregularities.  The right coronary artery has a 40% stenosis in the proximal vessel. The saphenous vein graft to right coronary artery is functionally occluded.  Saphenous vein graft to the first diagonal artery is a very large graft.  We'll schedule her for a repeat Lexus scan Myoview study. If it is positive then we'll proceed to heart catheterization.  We'll start her on isosorbide mononitrate 60 mg a day.

## 2011-01-06 ENCOUNTER — Other Ambulatory Visit (HOSPITAL_COMMUNITY): Payer: Medicare Other | Admitting: Radiology

## 2011-01-11 ENCOUNTER — Ambulatory Visit (HOSPITAL_COMMUNITY): Payer: Medicare Other | Attending: Cardiovascular Disease | Admitting: Radiology

## 2011-01-11 DIAGNOSIS — E119 Type 2 diabetes mellitus without complications: Secondary | ICD-10-CM | POA: Insufficient documentation

## 2011-01-11 DIAGNOSIS — Z951 Presence of aortocoronary bypass graft: Secondary | ICD-10-CM | POA: Insufficient documentation

## 2011-01-11 DIAGNOSIS — R55 Syncope and collapse: Secondary | ICD-10-CM | POA: Insufficient documentation

## 2011-01-11 DIAGNOSIS — I219 Acute myocardial infarction, unspecified: Secondary | ICD-10-CM

## 2011-01-11 DIAGNOSIS — I10 Essential (primary) hypertension: Secondary | ICD-10-CM | POA: Insufficient documentation

## 2011-01-11 DIAGNOSIS — I252 Old myocardial infarction: Secondary | ICD-10-CM | POA: Insufficient documentation

## 2011-01-11 DIAGNOSIS — Z87891 Personal history of nicotine dependence: Secondary | ICD-10-CM | POA: Insufficient documentation

## 2011-01-11 DIAGNOSIS — R079 Chest pain, unspecified: Secondary | ICD-10-CM | POA: Insufficient documentation

## 2011-01-11 DIAGNOSIS — I4510 Unspecified right bundle-branch block: Secondary | ICD-10-CM

## 2011-01-11 DIAGNOSIS — I2510 Atherosclerotic heart disease of native coronary artery without angina pectoris: Secondary | ICD-10-CM

## 2011-01-11 DIAGNOSIS — R0989 Other specified symptoms and signs involving the circulatory and respiratory systems: Secondary | ICD-10-CM | POA: Insufficient documentation

## 2011-01-11 DIAGNOSIS — R0609 Other forms of dyspnea: Secondary | ICD-10-CM | POA: Insufficient documentation

## 2011-01-11 DIAGNOSIS — E785 Hyperlipidemia, unspecified: Secondary | ICD-10-CM | POA: Insufficient documentation

## 2011-01-11 MED ORDER — TECHNETIUM TC 99M TETROFOSMIN IV KIT
33.0000 | PACK | Freq: Once | INTRAVENOUS | Status: AC | PRN
  Administered 2011-01-11: 33 via INTRAVENOUS

## 2011-01-11 MED ORDER — REGADENOSON 0.4 MG/5ML IV SOLN: 0.4000 mg | Freq: Once | INTRAVENOUS | Status: DC

## 2011-01-11 MED ORDER — TECHNETIUM TC 99M TETROFOSMIN IV KIT
11.0000 | PACK | Freq: Once | INTRAVENOUS | Status: AC | PRN
  Administered 2011-01-11: 11 via INTRAVENOUS

## 2011-01-11 MED ORDER — REGADENOSON 0.4 MG/5ML IV SOLN
0.4000 mg | Freq: Once | INTRAVENOUS | Status: AC
  Administered 2011-01-11: 0.4 mg via INTRAVENOUS

## 2011-01-11 NOTE — Progress Notes (Signed)
[facility] SITE 3 NUCLEAR MED [address] [place] [address] [phone]  Cardiology Nuclear Med Study  [name] is a 70 y.o. female [id number] Sep 10, 1940   Nuclear Med Background Indication for Stress Test:  Evaluation for Ischemia History: 09' CABGx3,10' Echo-55-60%Mild LVH,6/11 Heart Catheterization EF-60% mild-mod CAD, Myocardial Infarction and 09'Myocardial Perfusion Study EF=60% - ischemia Cardiac Risk Factors: History of Smoking, Hypertension, Lipids, NIDDM and RBBB  Symptoms:  Chest Pain (moves around/also with exertion, DOE and Near Syncope   Nuclear Pre-Procedure Caffeine/Decaff Intake:  None NPO After: 7:00pm   Lungs:  Clear /exp wheezing clears with cough IV 0.9% NS with Angio Cath:  20g  IV Site: R Antecubital  IV Started by:  Stanton Kidney, EMT-P  Chest Size (in):  40 Cup Size: B  Height: 5\' 3"  (1.6 m)  Weight:  220 lb (99.791 kg)  BMI:  Body mass index is 38.97 kg/(m^2). Tech Comments:  Bystolic held this am, CBG=109 @ 8:30 am, per patient.    Nuclear Med Study 1 or 2 day study: 1 day  Stress Test Type:  Eugenie Birks  Reading MD: Olga Millers, MD  Order Authorizing Provider:  Bryson Ha, MD  Resting Radionuclide: Technetium 62m Tetrofosmin  Resting Radionuclide Dose: 11.0 mCi   Stress Radionuclide:  Technetium 25m Tetrofosmin  Stress Radionuclide Dose: 33.0 mCi           Stress Protocol Rest HR: 72 Stress HR: 97  Rest BP: 116/58 Stress BP: 117/54  Exercise Time (min): n/a METS: n/a   Predicted Max HR: 148 bpm % Max HR: 67.57 bpm Rate Pressure Product: 38756   Dose of Adenosine (mg):  n/a Dose of Lexiscan: 0.4 mg  Dose of Atropine (mg): n/a Dose of Dobutamine: n/a mcg/kg/min (at max HR)  Stress Test Technologist: Frederick Peers, EMT-P  Nuclear Technologist:  Domenic Polite, CNMT     Rest Procedure:  Myocardial perfusion imaging was performed at rest 45 minutes following the intravenous administration of Technetium 79m  Tetrofosmin. Rest ECG: NSR-RBBB  Stress Procedure:  The patient received IV Lexiscan 0.4 mg over 15-seconds.  Technetium 58m Tetrofosmin injected at 30-seconds.  There were no significant changes with Lexiscan.  Quantitative spect images were obtained after a 45 minute delay. Stress ECG: No significant ST segment change suggestive of ischemia.  QPS Raw Data Images:  Acquisition technically good; normal left ventricular size. Stress Images:  There is decreased uptake in the inferior wall. Rest Images:  There is decreased uptake in the inferior wall. Subtraction (SDS):  No evidence of ischemia. Transient Ischemic Dilatation (Normal <1.22):  0.72 Lung/Heart Ratio (Normal <0.45):  0.30  Quantitative Gated Spect Images QGS EDV:  86 ml QGS ESV:  24 ml QGS cine images:  NL LV Function; NL Wall Motion QGS EF: 72%  Impression Exercise Capacity:  Lexiscan with no exercise. BP Response:  Normal blood pressure response. Clinical Symptoms:  There is chest pain. ECG Impression:  No significant ST segment change suggestive of ischemia. Comparison with Prior Nuclear Study: No images to compare  Overall Impression:  Probably normal stress nuclear study with a small fixed inferobasal defect suggestive of thinning; no ischemia.   Olga Millers

## 2011-03-31 ENCOUNTER — Ambulatory Visit (INDEPENDENT_AMBULATORY_CARE_PROVIDER_SITE_OTHER): Payer: Medicare Other | Admitting: Cardiovascular Disease

## 2011-03-31 ENCOUNTER — Encounter: Payer: Self-pay | Admitting: Cardiovascular Disease

## 2011-03-31 VITALS — BP 125/79 | HR 79 | Ht 63.0 in | Wt 227.4 lb

## 2011-03-31 DIAGNOSIS — I2510 Atherosclerotic heart disease of native coronary artery without angina pectoris: Secondary | ICD-10-CM

## 2011-03-31 DIAGNOSIS — E785 Hyperlipidemia, unspecified: Secondary | ICD-10-CM

## 2011-03-31 LAB — BASIC METABOLIC PANEL
CO2: 32 mEq/L (ref 19–32)
Calcium: 8.5 mg/dL (ref 8.4–10.5)
Chloride: 103 mEq/L (ref 96–112)
Creatinine, Ser: 0.7 mg/dL (ref 0.4–1.2)
Glucose, Bld: 134 mg/dL — ABNORMAL HIGH (ref 70–99)

## 2011-03-31 LAB — LIPID PANEL
HDL: 43.2 mg/dL (ref >39.00)
LDL Cholesterol: 103 mg/dL — ABNORMAL HIGH (ref 0–99)
Total CHOL/HDL Ratio: 4
Triglycerides: 67 mg/dL (ref 0.0–149.0)

## 2011-03-31 LAB — HEPATIC FUNCTION PANEL
ALT: 15 U/L (ref 0–35)
AST: 15 U/L (ref 0–37)
Alkaline Phosphatase: 116 U/L (ref 39–117)
Bilirubin, Direct: 0.1 mg/dL (ref 0.0–0.3)
Total Bilirubin: 0.3 mg/dL (ref 0.3–1.2)

## 2011-03-31 NOTE — Patient Instructions (Signed)
Your physician wants you to follow-up in: 6 months.  You will receive a reminder letter in the mail two months in advance. If you don't receive a letter, please call our office to schedule the follow-up appointment.  Your physician recommends that you return for fasting lab work and EKG at your 6 month appointment.

## 2011-03-31 NOTE — Assessment & Plan Note (Signed)
[name] continues to have episodes of chest discomfort. She has known coronary artery disease and has had coronary artery bypass grafting. Her most recent cardiac catheterization performed in 2011 revealed a severely diseased saphenous vein graft to right coronary artery but the native right coronary artery had brisk flow. The SVG to the diagonal artery was normal. The left internal mammary artery was basically atretic and the left anterior descending artery fed via the SVG to diagonal artery and from the native LAD circulation. There was no critical lesions that should be causing her persistent pain. The left circumflex c is a large vessel. The acute marginal artery is moderate size and is normal.  We started her on isosorbide but she took it incorrectly. She was taking it on an as-needed basis. I instructed her to take at least one half of the isosorbide (30 mg) these each day to help prevent these episodes of chest pain.  I've asked her to check with her general medical doctor for other causes of noncardiac chest pain.

## 2011-03-31 NOTE — Progress Notes (Signed)
[name] Date of Birth  [birth year] [facility]  [address]    [address]   [address] [place], [place]  [address]    [place], [place]  [address] [phone]  Fax  [phone]  [phone]  Fax [phone]  Problem list: 1. Coronary artery disease-status post CABG, the saphenous vein graft to the right coronary artery is severely diseased but she has good flow to the distal right coronary artery from the native circulation. 2. Diabetes mellitus 3. Hypertension 4. Hyperlipidemia   History of Present Illness:  Ms. Locastro is a 71 y.o. female with the above noted hx.  She continues to have episodes of sharp pain ( few seconds)  followed by a burning pain ( last 6-7 minutes).  She usually does not take NTG because it causes a headache.  We gave her Imdur during her last visit and she was taking it as needed and it was making her sick.  Current Outpatient Prescriptions on File Prior to Visit  Medication Sig Dispense Refill  . acetaminophen (TYLENOL) 325 MG tablet Take 650 mg by mouth every 6 (six) hours as needed.        . ALPRAZolam (XANAX) 0.25 MG tablet Take 0.25 mg by mouth daily.        Marland Kitchen aspirin 325 MG tablet Take 325 mg by mouth daily.        . Cholecalciferol (VITAMIN D) 2000 UNITS tablet Take 2,000 Units by mouth daily.        . Cyanocobalamin (VITAMIN B-12 IJ) Inject as directed every 30 (thirty) days.        Marland Kitchen doxazosin (CARDURA) 8 MG tablet Take 1 tablet (8 mg total) by mouth at bedtime.  30 tablet  5  . Fluticasone-Salmeterol (ADVAIR HFA IN) Inhale into the lungs as needed.        . isosorbide mononitrate (IMDUR) 60 MG 24 hr tablet Take 1 tablet (60 mg total) by mouth every morning.  30 tablet  5  . levalbuterol (XOPENEX HFA) 45 MCG/ACT inhaler Inhale 1-2 puffs into the lungs every 4 (four) hours as needed.        Marland Kitchen losartan (COZAAR) 100 MG tablet TAKE 1 TABLET BY MOUTH DAILY  30 tablet  2  . Multiple Vitamin (MULTIVITAMIN)  tablet Take 1 tablet by mouth daily.        . nebivolol (BYSTOLIC) 10 MG tablet Take 5 mg by mouth daily.        . nitroGLYCERIN (NITROSTAT) 0.4 MG SL tablet Place 0.4 mg under the tongue every 5 (five) minutes as needed.          Allergies  Allergen Reactions  . Amlodipine   . Codeine   . Morphine   . Sulfonamide Derivatives     REACTION: swelling    Past Medical History  Diagnosis Date  . Coronary artery disease   . Diabetes mellitus   . Hypertension   . Hyperlipidemia   . Shoulder injury   . Chest pain     Past Surgical History  Procedure Date  . Cardiac catheterization 07/23/2009    EF 60%  . Cardiac catheterization 10/11/2008  . Cardiac catheterization 03/01/2007    EF 75-80%  . Coronary artery bypass graft     SEVERELY DISEASED SAPHENOUS VEIN GRAFT TO THE RIGHT CORONARY ARTERY BUT WITH FAIRLY WELL PRESERVED FLOW TO THE DISTAL RIGHT CORONARY ARTERY FROM THE NATIVE CIRCULATION-RESTART  CATH IN  JUNE 2000, REVEALS MILD/MODERATE  CAD WITH GOOD FLOW DOWN HER LAD  . US echocardiography 03/08/2008    EF 55-60%  . Cardiovascular stress test 11/15/2007    EF 60%    History  Smoking status  . Former Smoker  . Quit date: 08/20/1982  Smokeless tobacco  . Not on file    History  Alcohol Use No    No family history on file.  Reviw of Systems:  Reviewed in the HPI.  All other systems are negative.  Physical Exam: Blood pressure 125/79, pulse 79, height 5\' 3"  (1.6 m), weight 227 lb 6.4 oz (103.148 kg). General: Well developed, well nourished, in no acute distress.  Head: Normocephalic, atraumatic, sclera non-icteric, mucus membranes are moist,   Neck: Supple. Negative for carotid bruits. JVD not elevated.  Lungs: Clear bilaterally to auscultation without wheezes, rales, or rhonchi. Breathing is unlabored.  Heart: RRR with S1 S2. No murmurs, rubs, or gallops  Abdomen: Soft, non-tender, non-distended with normoactive bowel sounds. No hepatomegaly. No rebound/guarding.  No obvious abdominal masses.  Msk:  Strength and tone appear normal for age.  Extremities: No clubbing or cyanosis. No edema.  Distal pedal pulses are 2+ and equal bilaterally.  She has extremely long fingernails  Neuro: Alert and oriented X 3. Moves all extremities spontaneously.  Psych:  Responds to questions appropriately with a normal affect.  ECG:  Assessment / Plan:

## 2011-04-06 ENCOUNTER — Telehealth: Payer: Self-pay | Admitting: *Deleted

## 2011-04-06 DIAGNOSIS — E785 Hyperlipidemia, unspecified: Secondary | ICD-10-CM

## 2011-04-06 MED ORDER — ATORVASTATIN CALCIUM 40 MG PO TABS: 40.0000 mg | ORAL_TABLET | Freq: Every day | ORAL | Status: DC

## 2011-04-06 NOTE — Telephone Encounter (Signed)
Patient called with lab results. Pt verbalized understanding. Lab f/u set, Alfonso Ramus RN

## 2011-04-07 ENCOUNTER — Telehealth: Payer: Self-pay | Admitting: Cardiovascular Disease

## 2011-04-07 NOTE — Telephone Encounter (Signed)
PER Dr Elease Hashimoto pt to take half the dose for one week to see if helps then go to full dose if tolerates, pt told to stay hydrated and may take later in day when she is better hydrated. Pt agreed to plan and will call back if has questions or concerns.

## 2011-04-07 NOTE — Telephone Encounter (Signed)
Pt calling re  med isosorbide taking for 1 week,  Having headaches now for the entire time she's been on it, pls advise

## 2011-04-10 ENCOUNTER — Other Ambulatory Visit: Payer: Self-pay | Admitting: Critical Care Medicine

## 2011-04-17 ENCOUNTER — Other Ambulatory Visit: Payer: Self-pay | Admitting: Critical Care Medicine

## 2011-04-27 ENCOUNTER — Other Ambulatory Visit: Payer: Self-pay | Admitting: Critical Care Medicine

## 2011-04-27 NOTE — Telephone Encounter (Signed)
Looks like pt's last OV with PW was 12/29/09 and has no pending appts.  I called pt - [email or web address] to schedule OV with PW then will send rx to pharm to last until OV.

## 2011-04-29 ENCOUNTER — Telehealth: Payer: Self-pay | Admitting: Critical Care Medicine

## 2011-04-29 NOTE — Telephone Encounter (Signed)
See refill note. This has been taking care of.

## 2011-04-29 NOTE — Telephone Encounter (Signed)
Pt is scheduled to come in 05/26/11. She is aware she needs to keep OV for further refills.

## 2011-04-29 NOTE — Telephone Encounter (Signed)
Called pt's home and cell #'s - [email or web address] 

## 2011-05-05 ENCOUNTER — Other Ambulatory Visit: Payer: Self-pay | Admitting: *Deleted

## 2011-05-05 MED ORDER — DOXAZOSIN MESYLATE 8 MG PO TABS: 8.0000 mg | ORAL_TABLET | Freq: Every day | ORAL | Status: DC

## 2011-05-25 ENCOUNTER — Encounter: Payer: Self-pay | Admitting: *Deleted

## 2011-05-26 ENCOUNTER — Ambulatory Visit: Payer: Medicare Other | Admitting: Critical Care Medicine

## 2011-06-23 ENCOUNTER — Other Ambulatory Visit: Payer: Self-pay | Admitting: Critical Care Medicine

## 2011-06-23 NOTE — Telephone Encounter (Signed)
Pt was last seen by Dr. Delford Field on 12/29/2009.  She does have a pending OV with him on July 19, 2011.  I will send rx to pharm with instructions that pt will need to keep this appt for additional refills.

## 2011-07-06 ENCOUNTER — Other Ambulatory Visit (INDEPENDENT_AMBULATORY_CARE_PROVIDER_SITE_OTHER): Payer: Medicare Other

## 2011-07-06 DIAGNOSIS — E785 Hyperlipidemia, unspecified: Secondary | ICD-10-CM

## 2011-07-06 LAB — LIPID PANEL
Cholesterol: 122 mg/dL (ref 0–200)
HDL: 40.8 mg/dL (ref >39.00)
LDL Cholesterol: 67 mg/dL (ref 0–99)
VLDL: 14.6 mg/dL (ref 0.0–40.0)

## 2011-07-06 LAB — HEPATIC FUNCTION PANEL
ALT: 12 U/L (ref 0–35)
Albumin: 3.6 g/dL (ref 3.5–5.2)
Bilirubin, Direct: 0 mg/dL (ref 0.0–0.3)
Total Protein: 6.8 g/dL (ref 6.0–8.3)

## 2011-07-06 LAB — BASIC METABOLIC PANEL
BUN: 19 mg/dL (ref 6–23)
Chloride: 105 mEq/L (ref 96–112)
GFR: 107.33 mL/min (ref >60.00)
Potassium: 4.2 mEq/L (ref 3.5–5.1)
Sodium: 143 mEq/L (ref 135–145)

## 2011-07-19 ENCOUNTER — Encounter: Payer: Self-pay | Admitting: Critical Care Medicine

## 2011-07-19 ENCOUNTER — Ambulatory Visit (INDEPENDENT_AMBULATORY_CARE_PROVIDER_SITE_OTHER): Payer: Medicare Other | Admitting: Critical Care Medicine

## 2011-07-19 VITALS — BP 104/58 | HR 78 | Temp 98.2°F | Ht 63.0 in | Wt 230.0 lb

## 2011-07-19 DIAGNOSIS — J45909 Unspecified asthma, uncomplicated: Secondary | ICD-10-CM

## 2011-07-19 MED ORDER — FLUTICASONE-SALMETEROL 250-50 MCG/DOSE IN AEPB: 1.0000 | INHALATION_SPRAY | Freq: Two times a day (BID) | RESPIRATORY_TRACT | Status: DC

## 2011-07-19 NOTE — Progress Notes (Signed)
Subjective:    Patient ID: [name], female    DOB: 1940/07/31, 71 y.o.   MRN: [id number]  HPI 07/19/2011 Not seen since 04/2009. Hx of asthma.  Now dyspnea is worse.  Cough is better.  When has asthma attack will cough. Now aves asthma flare with weather change.  Now is dyspneic with exertion on any distance. Notes some chest tightness.   Notes some qhs dyspnea.   Past Medical History  Diagnosis Date  . Coronary artery disease   . Diabetes mellitus   . Hypertension   . Hyperlipidemia   . Shoulder injury   . Chest pain   . Acute myocardial infarction, unspecified site, episode of care unspecified   . Unspecified asthma      Family History  Problem Relation Age of Onset  . Emphysema Paternal Uncle   . Heart disease Maternal Grandfather   . Esophageal cancer Brother      History   Social History  . Marital Status: Widowed    Spouse Name: N/A    Number of Children: 4  . Years of Education: N/A   Occupational History  .     Social History Main Topics  . Smoking status: Former Smoker -- 0.5 packs/day for 25 years    Types: Cigarettes    Quit date: 08/20/1982  . Smokeless tobacco: Never Used  . Alcohol Use: No  . Drug Use: No  . Sexually Active: Not on file   Other Topics Concern  . Not on file   Social History Narrative  . No narrative on file     Allergies  Allergen Reactions  . Amlodipine   . Codeine   . Morphine   . Sulfonamide Derivatives     REACTION: swelling     Outpatient Prescriptions Prior to Visit  Medication Sig Dispense Refill  . acetaminophen (TYLENOL) 325 MG tablet Take 650 mg by mouth every 6 (six) hours as needed.        . ALPRAZolam (XANAX) 0.25 MG tablet Take 0.25 mg by mouth daily.        Marland Kitchen aspirin 325 MG tablet Take 325 mg by mouth daily.        . Calcium & Magnesium Carbonates (MYLANTA PO) Take by mouth daily. Pt states she is taking the generic store brand      . Cholecalciferol (VITAMIN D) 2000 UNITS tablet Take 2,000  Units by mouth daily.        . Cyanocobalamin (VITAMIN B-12 IJ) Inject as directed every 30 (thirty) days.        Marland Kitchen doxazosin (CARDURA) 8 MG tablet Take 1 tablet (8 mg total) by mouth at bedtime.  30 tablet  5  . famotidine (PEPCID) 20 MG tablet TAKE 1 TABLET BY MOUTH EVERY DAY  30 tablet  0  . isosorbide mononitrate (IMDUR) 60 MG 24 hr tablet Take 1 tablet (60 mg total) by mouth every morning.  30 tablet  5  . losartan (COZAAR) 100 MG tablet TAKE 1 TABLET BY MOUTH DAILY  30 tablet  0  . Multiple Vitamin (MULTIVITAMIN) tablet Take 1 tablet by mouth daily.        . nebivolol (BYSTOLIC) 10 MG tablet Take 5 mg by mouth daily.        . nitroGLYCERIN (NITROSTAT) 0.4 MG SL tablet Place 0.4 mg under the tongue every 5 (five) minutes as needed.        Marland Kitchen atorvastatin (LIPITOR) 40 MG tablet Take 1 tablet (40  mg total) by mouth daily.  90 tablet  1  . levalbuterol (XOPENEX HFA) 45 MCG/ACT inhaler Inhale 1-2 puffs into the lungs every 4 (four) hours as needed.        . Fluticasone-Salmeterol (ADVAIR HFA IN) Inhale into the lungs as needed.        Marland Kitchen omeprazole (PRILOSEC) 20 MG capsule           Review of Systems Constitutional:   No  weight loss, night sweats,  Fevers, chills, fatigue, lassitude. HEENT:   No headaches,  Difficulty swallowing,  Tooth/dental problems,  Sore throat,                No sneezing, itching, ear ache, nasal congestion, post nasal drip,   CV:  No chest pain,  Orthopnea, PND, swelling in lower extremities, anasarca, dizziness, palpitations  GI  No heartburn, indigestion, abdominal pain, nausea, vomiting, diarrhea, change in bowel habits, loss of appetite  Resp: No shortness of breath with exertion or at rest.  No excess mucus, no productive cough,  No non-productive cough,  No coughing up of blood.  No change in color of mucus.  No wheezing.  No chest wall deformity  Skin: no rash or lesions.  GU: no dysuria, change in color of urine, no urgency or frequency.  No flank  pain.  MS:  No joint pain or swelling.  No decreased range of motion.  No back pain.  Psych:  No change in mood or affect. No depression or anxiety.  No memory loss.     Objective:   Physical Exam Filed Vitals:   07/19/11 1546  BP: 104/58  Pulse: 78  Temp: 98.2 F (36.8 C)  TempSrc: Oral  Height: 5\' 3"  (1.6 m)  Weight: 230 lb (104.327 kg)  SpO2: 95%    Gen: Pleasant, well-nourished, in no distress,  normal affect  ENT: No lesions,  mouth clear,  oropharynx clear, no postnasal drip  Neck: No JVD, no TMG, no carotid bruits  Lungs: No use of accessory muscles, no dullness to percussion, clear without rales or rhonchi  Cardiovascular: RRR, heart sounds normal, no murmur or gallops, no peripheral edema  Abdomen: soft and NT, no HSM,  BS normal  Musculoskeletal: No deformities, no cyanosis or clubbing  Neuro: alert, non focal  Skin: Warm, no lesions or rashes  No results found.        Assessment & Plan:   ASTHMA Severe persistent asthma with Advair overuse Plan Maintain Advair one inhalation twice daily Obtain full set of pulmonary function studies    Updated Medication List Outpatient Encounter Prescriptions as of 07/19/2011  Medication Sig Dispense Refill  . acetaminophen (TYLENOL) 325 MG tablet Take 650 mg by mouth every 6 (six) hours as needed.        Marland Kitchen albuterol (PROVENTIL HFA;VENTOLIN HFA) 108 (90 BASE) MCG/ACT inhaler Inhale 2 puffs into the lungs every 6 (six) hours as needed.      Marland Kitchen albuterol (PROVENTIL) (2.5 MG/3ML) 0.083% nebulizer solution Take 2.5 mg by nebulization every 6 (six) hours as needed.      . ALPRAZolam (XANAX) 0.25 MG tablet Take 0.25 mg by mouth daily.        Marland Kitchen aspirin 325 MG tablet Take 325 mg by mouth daily.        Marland Kitchen atorvastatin (LIPITOR) 40 MG tablet Take 40 mg by mouth every other day.      . Calcium & Magnesium Carbonates (MYLANTA PO) Take by mouth daily. Pt  states she is taking the generic store brand      . Cholecalciferol  (VITAMIN D) 2000 UNITS tablet Take 2,000 Units by mouth daily.        . Cyanocobalamin (VITAMIN B-12 IJ) Inject as directed every 30 (thirty) days.        Marland Kitchen doxazosin (CARDURA) 8 MG tablet Take 1 tablet (8 mg total) by mouth at bedtime.  30 tablet  5  . famotidine (PEPCID) 20 MG tablet TAKE 1 TABLET BY MOUTH EVERY DAY  30 tablet  0  . Fluticasone-Salmeterol (ADVAIR DISKUS) 250-50 MCG/DOSE AEPB Inhale 1 puff into the lungs 2 (two) times daily.  60 each  6  . HUMALOG MIX 75/25 (75-25) 100 UNIT/ML SUSP 20 units in the am and 40 units at dinner      . isosorbide mononitrate (IMDUR) 60 MG 24 hr tablet Take 1 tablet (60 mg total) by mouth every morning.  30 tablet  5  . losartan (COZAAR) 100 MG tablet TAKE 1 TABLET BY MOUTH DAILY  30 tablet  0  . methocarbamol (ROBAXIN) 500 MG tablet Take 1 tablet by mouth Twice daily.      . Multiple Vitamin (MULTIVITAMIN) tablet Take 1 tablet by mouth daily.        . nebivolol (BYSTOLIC) 10 MG tablet Take 5 mg by mouth daily.        . nitroGLYCERIN (NITROSTAT) 0.4 MG SL tablet Place 0.4 mg under the tongue every 5 (five) minutes as needed.        Marland Kitchen omeprazole (PRILOSEC) 20 MG capsule Take 20 mg by mouth daily.      Marland Kitchen DISCONTD: atorvastatin (LIPITOR) 40 MG tablet Take 1 tablet (40 mg total) by mouth daily.  90 tablet  1  . DISCONTD: Fluticasone-Salmeterol (ADVAIR DISKUS IN) Inhale 2 Inhalers into the lungs 3 (three) times daily as needed. UNSURE OF STREGNTH      . levalbuterol (XOPENEX HFA) 45 MCG/ACT inhaler Inhale 1-2 puffs into the lungs every 4 (four) hours as needed.        Marland Kitchen DISCONTD: Fluticasone-Salmeterol (ADVAIR HFA IN) Inhale into the lungs as needed.        Marland Kitchen DISCONTD: omeprazole (PRILOSEC) 20 MG capsule

## 2011-07-19 NOTE — Patient Instructions (Signed)
PUlmonary function studies will be ordered Change Advair to one puff twice daily 250 strength Return 4 months, I will call with breathing test results sooner

## 2011-07-19 NOTE — Assessment & Plan Note (Signed)
Severe persistent asthma with Advair overuse Plan Maintain Advair one inhalation twice daily Obtain full set of pulmonary function studies

## 2011-07-30 ENCOUNTER — Other Ambulatory Visit: Payer: Self-pay | Admitting: Critical Care Medicine

## 2011-08-04 ENCOUNTER — Ambulatory Visit (INDEPENDENT_AMBULATORY_CARE_PROVIDER_SITE_OTHER): Payer: Medicare Other | Admitting: Critical Care Medicine

## 2011-08-04 DIAGNOSIS — J45909 Unspecified asthma, uncomplicated: Secondary | ICD-10-CM

## 2011-08-04 LAB — PULMONARY FUNCTION TEST

## 2011-08-04 NOTE — Progress Notes (Signed)
PFT done today. 

## 2011-08-10 ENCOUNTER — Encounter: Payer: Self-pay | Admitting: Critical Care Medicine

## 2011-08-10 ENCOUNTER — Telehealth: Payer: Self-pay | Admitting: Critical Care Medicine

## 2011-08-10 DIAGNOSIS — J45909 Unspecified asthma, uncomplicated: Secondary | ICD-10-CM

## 2011-08-10 NOTE — Telephone Encounter (Signed)
Call the pt and tell [name] PFTs show severe obstruction She needs a return OV soon to regroup and discuss next steps, I will review PFTs in detail at that OV

## 2011-08-11 ENCOUNTER — Other Ambulatory Visit: Payer: Self-pay | Admitting: Gastroenterology

## 2011-08-11 NOTE — Telephone Encounter (Signed)
Regroup with tammy first

## 2011-08-11 NOTE — Telephone Encounter (Signed)
Called, spoke with pt.  I informed her of below per Dr. Delford Field.  She verbalized understanding of this.  She does not have a pending OV at this time.  Dr. Lynelle Doctor first available at this time is Sep 29, 2011.  Dr. Delford Field, pls advise if you would like to work pt in in [place] prior to this or if you are ok with her seeing TP to regroup and discuss PFTs in greater detail.

## 2011-08-11 NOTE — Telephone Encounter (Signed)
Called pt's home and cell #'s - [email or web address] 

## 2011-08-11 NOTE — Telephone Encounter (Signed)
Called, spoke with pt.  We have scheduled her to see TP on Tuesday, July 9 at 4:30 pm -- pt aware and voiced no further questions/concerns at this time.

## 2011-08-11 NOTE — Telephone Encounter (Signed)
Pt returned [name] call.  [name] ° °

## 2011-08-17 ENCOUNTER — Encounter: Payer: Self-pay | Admitting: Adult Health

## 2011-08-17 ENCOUNTER — Ambulatory Visit (INDEPENDENT_AMBULATORY_CARE_PROVIDER_SITE_OTHER)
Admission: RE | Admit: 2011-08-17 | Discharge: 2011-08-17 | Disposition: A | Discharge status: Home / Self Care | Payer: Medicare Other | Source: Ambulatory Visit | Attending: Adult Health | Admitting: Adult Health

## 2011-08-17 ENCOUNTER — Ambulatory Visit (INDEPENDENT_AMBULATORY_CARE_PROVIDER_SITE_OTHER): Payer: Medicare Other | Admitting: Adult Health

## 2011-08-17 VITALS — BP 120/68 | HR 79 | Temp 97.6°F | Ht 63.0 in | Wt 232.4 lb

## 2011-08-17 DIAGNOSIS — J45909 Unspecified asthma, uncomplicated: Secondary | ICD-10-CM

## 2011-08-17 MED ORDER — TIOTROPIUM BROMIDE MONOHYDRATE 18 MCG IN CAPS: 18.0000 ug | ORAL_CAPSULE | Freq: Every day | RESPIRATORY_TRACT | Status: DC

## 2011-08-17 NOTE — Patient Instructions (Addendum)
Continue on Advair 250/22mcg 1 puff Twice daily   Add Spiriva  1 puff daily  I will call with xray results.  follow up Dr. Delford Field  In 6 weeks and As needed

## 2011-08-20 ENCOUNTER — Encounter: Payer: Self-pay | Admitting: Critical Care Medicine

## 2011-08-20 NOTE — Assessment & Plan Note (Addendum)
Mod to severe asthma  CXR pending  Does have smoking hx ? COPD as well  Will give trial of Spiriva to help with symptom control   Plan;  Continue on Advair 250/64mcg 1 puff Twice daily   Add Spiriva  1 puff daily  I will call with xray results.  follow up Dr. Delford Field  In 6 weeks and As needed

## 2011-08-20 NOTE — Progress Notes (Signed)
Subjective:    Patient ID: [name], female    DOB: [birth year], 71 y.o.   MRN: [id number]  HPI  07/19/2011 Not seen since 04/2009. Hx of asthma.  Now dyspnea is worse.  Cough is better.  When has asthma attack will cough. Now aves asthma flare with weather change.  Now is dyspneic with exertion on any distance. Notes some chest tightness.   Notes some qhs dyspnea. >>advair 250 Twice daily   PFT >08/04/2011  PFTs:  FeV1 48%  FVC 60%  25% improvement with BD TLC 80%  DLCO 38%  79/13 Follow up  Returns for follow up of asthma  Still having symptoms on Advair with dyspnea on/off, wheezing and cough.  PFT showed mod/severe airflow obstruction with sign improvement at 25% post SABA  She does have hx of smoking in past.  No chest pain , hemoptysis or fever  Does get some swelling.  Does get dyspneic at night ? Orthopnea -mild   Past Medical History  Diagnosis Date  . Coronary artery disease   . Diabetes mellitus   . Hypertension   . Hyperlipidemia   . Shoulder injury   . Chest pain   . Acute myocardial infarction, unspecified site, episode of care unspecified   . Unspecified asthma      Family History  Problem Relation Age of Onset  . Emphysema Paternal Uncle   . Heart disease Maternal Grandfather   . Esophageal cancer Brother      History   Social History  . Marital Status: Widowed    Spouse Name: N/A    Number of Children: 4  . Years of Education: N/A   Occupational History  .     Social History Main Topics  . Smoking status: Former Smoker -- 0.5 packs/day for 25 years    Types: Cigarettes    Quit date: 08/20/1982  . Smokeless tobacco: Never Used  . Alcohol Use: No  . Drug Use: No  . Sexually Active: Not on file   Other Topics Concern  . Not on file   Social History Narrative  . No narrative on file     Allergies  Allergen Reactions  . Amlodipine   . Codeine   . Morphine   . Sulfonamide Derivatives     REACTION: swelling     Outpatient  Prescriptions Prior to Visit  Medication Sig Dispense Refill  . acetaminophen (TYLENOL) 325 MG tablet Take 650 mg by mouth every 6 (six) hours as needed.        Marland Kitchen albuterol (PROVENTIL HFA;VENTOLIN HFA) 108 (90 BASE) MCG/ACT inhaler Inhale 2 puffs into the lungs every 6 (six) hours as needed.      Marland Kitchen albuterol (PROVENTIL) (2.5 MG/3ML) 0.083% nebulizer solution Take 2.5 mg by nebulization every 6 (six) hours as needed.      . ALPRAZolam (XANAX) 0.25 MG tablet Take 0.25 mg by mouth daily.        Marland Kitchen aspirin 325 MG tablet Take 325 mg by mouth daily.        Marland Kitchen atorvastatin (LIPITOR) 40 MG tablet Take 40 mg by mouth every other day.      . Calcium & Magnesium Carbonates (MYLANTA PO) Take by mouth daily. Pt states she is taking the generic store brand      . Cholecalciferol (VITAMIN D) 2000 UNITS tablet Take 2,000 Units by mouth daily.        . Cyanocobalamin (VITAMIN B-12 IJ) Inject as directed every 30 (thirty) days.        Marland Kitchen  doxazosin (CARDURA) 8 MG tablet Take 1 tablet (8 mg total) by mouth at bedtime.  30 tablet  5  . famotidine (PEPCID) 20 MG tablet TAKE 1 TABLET BY MOUTH EVERY DAY  30 tablet  0  . Fluticasone-Salmeterol (ADVAIR DISKUS) 250-50 MCG/DOSE AEPB Inhale 1 puff into the lungs 2 (two) times daily.  60 each  6  . HUMALOG MIX 75/25 (75-25) 100 UNIT/ML SUSP 20 units in the am and 40 units at dinner      . levalbuterol (XOPENEX HFA) 45 MCG/ACT inhaler Inhale 1-2 puffs into the lungs every 4 (four) hours as needed.        Marland Kitchen losartan (COZAAR) 100 MG tablet TAKE 1 TABLET BY MOUTH DAILY  30 tablet  0  . methocarbamol (ROBAXIN) 500 MG tablet Take 1 tablet by mouth Twice daily.      . Multiple Vitamin (MULTIVITAMIN) tablet Take 1 tablet by mouth daily.        . nebivolol (BYSTOLIC) 10 MG tablet Take 5 mg by mouth daily.        . nitroGLYCERIN (NITROSTAT) 0.4 MG SL tablet Place 0.4 mg under the tongue every 5 (five) minutes as needed.        Marland Kitchen omeprazole (PRILOSEC) 20 MG capsule Take 20 mg by mouth  daily.      . isosorbide mononitrate (IMDUR) 60 MG 24 hr tablet Take 1 tablet (60 mg total) by mouth every morning.  30 tablet  5      Review of Systems  Constitutional:   No  weight loss, night sweats,  Fevers, chills,  ++fatigue, lassitude. HEENT:   No headaches,  Difficulty swallowing,  Tooth/dental problems,  Sore throat,                No sneezing, itching, ear ache, nasal congestion, post nasal drip,   CV:  No chest pain,    , anasarca, dizziness, palpitations  GI  No heartburn, indigestion, abdominal pain, nausea, vomiting, diarrhea, change in bowel habits, loss of appetite  Resp:    No coughing up of blood.  No change in color of mucus.   .  No chest wall deformity  Skin: no rash or lesions.  GU: no dysuria, change in color of urine, no urgency or frequency.  No flank pain.  MS:  No joint pain or swelling.  No decreased range of motion.  No back pain.  Psych:  No change in mood or affect. No depression or anxiety.  No memory loss.     Objective:   Physical Exam  Filed Vitals:   08/17/11 1733  BP: 120/68  Pulse: 79  Temp: 97.6 F (36.4 C)  TempSrc: Oral  Height: 5\' 3"  (1.6 m)  Weight: 232 lb 6.4 oz (105.416 kg)  SpO2: 92%    Gen: Pleasant, well obese , in no distress,  normal affect  ENT: No lesions,  mouth clear,  oropharynx clear, no postnasal drip  Neck: No JVD, no TMG, no carotid bruits  Lungs: No use of accessory muscles, no dullness to percussion, coarse BS   Cardiovascular: RRR, heart sounds normal, no murmur or gallops, tr  peripheral edema  Abdomen: soft and NT, no HSM,  BS normal  Musculoskeletal: No deformities, no cyanosis or clubbing  Neuro: alert, non focal  Skin: Warm, no lesions or rashes  No results found.        Assessment & Plan:   No problem-specific assessment & plan notes found for this encounter.  Updated Medication List Outpatient Encounter Prescriptions as of 08/17/2011  Medication Sig Dispense Refill  .  acetaminophen (TYLENOL) 325 MG tablet Take 650 mg by mouth every 6 (six) hours as needed.        Marland Kitchen albuterol (PROVENTIL HFA;VENTOLIN HFA) 108 (90 BASE) MCG/ACT inhaler Inhale 2 puffs into the lungs every 6 (six) hours as needed.      Marland Kitchen albuterol (PROVENTIL) (2.5 MG/3ML) 0.083% nebulizer solution Take 2.5 mg by nebulization every 6 (six) hours as needed.      . ALPRAZolam (XANAX) 0.25 MG tablet Take 0.25 mg by mouth daily.        Marland Kitchen aspirin 325 MG tablet Take 325 mg by mouth daily.        Marland Kitchen atorvastatin (LIPITOR) 40 MG tablet Take 40 mg by mouth every other day.      . Calcium & Magnesium Carbonates (MYLANTA PO) Take by mouth daily. Pt states she is taking the generic store brand      . Cholecalciferol (VITAMIN D) 2000 UNITS tablet Take 2,000 Units by mouth daily.        . Cyanocobalamin (VITAMIN B-12 IJ) Inject as directed every 30 (thirty) days.        Marland Kitchen doxazosin (CARDURA) 8 MG tablet Take 1 tablet (8 mg total) by mouth at bedtime.  30 tablet  5  . famotidine (PEPCID) 20 MG tablet TAKE 1 TABLET BY MOUTH EVERY DAY  30 tablet  0  . Fluticasone-Salmeterol (ADVAIR DISKUS) 250-50 MCG/DOSE AEPB Inhale 1 puff into the lungs 2 (two) times daily.  60 each  6  . HUMALOG MIX 75/25 (75-25) 100 UNIT/ML SUSP 20 units in the am and 40 units at dinner      . levalbuterol (XOPENEX HFA) 45 MCG/ACT inhaler Inhale 1-2 puffs into the lungs every 4 (four) hours as needed.        Marland Kitchen losartan (COZAAR) 100 MG tablet TAKE 1 TABLET BY MOUTH DAILY  30 tablet  0  . methocarbamol (ROBAXIN) 500 MG tablet Take 1 tablet by mouth Twice daily.      . Multiple Vitamin (MULTIVITAMIN) tablet Take 1 tablet by mouth daily.        . nebivolol (BYSTOLIC) 10 MG tablet Take 5 mg by mouth daily.        . nitroGLYCERIN (NITROSTAT) 0.4 MG SL tablet Place 0.4 mg under the tongue every 5 (five) minutes as needed.        Marland Kitchen omeprazole (PRILOSEC) 20 MG capsule Take 20 mg by mouth daily.      Marland Kitchen tiotropium (SPIRIVA HANDIHALER) 18 MCG inhalation  capsule Place 1 capsule (18 mcg total) into inhaler and inhale daily.  30 capsule  5  . DISCONTD: isosorbide mononitrate (IMDUR) 60 MG 24 hr tablet Take 1 tablet (60 mg total) by mouth every morning.  30 tablet  5

## 2011-09-04 ENCOUNTER — Other Ambulatory Visit: Payer: Self-pay | Admitting: Critical Care Medicine

## 2011-09-07 ENCOUNTER — Encounter: Payer: Self-pay | Admitting: Physician Assistant

## 2011-09-07 ENCOUNTER — Ambulatory Visit (INDEPENDENT_AMBULATORY_CARE_PROVIDER_SITE_OTHER): Payer: Medicare Other | Admitting: Physician Assistant

## 2011-09-07 VITALS — BP 150/77 | HR 86 | Ht 63.0 in | Wt 230.0 lb

## 2011-09-07 DIAGNOSIS — J45909 Unspecified asthma, uncomplicated: Secondary | ICD-10-CM

## 2011-09-07 DIAGNOSIS — I2510 Atherosclerotic heart disease of native coronary artery without angina pectoris: Secondary | ICD-10-CM

## 2011-09-07 DIAGNOSIS — R079 Chest pain, unspecified: Secondary | ICD-10-CM

## 2011-09-07 DIAGNOSIS — R0602 Shortness of breath: Secondary | ICD-10-CM

## 2011-09-07 DIAGNOSIS — R059 Cough, unspecified: Secondary | ICD-10-CM

## 2011-09-07 DIAGNOSIS — R05 Cough: Secondary | ICD-10-CM

## 2011-09-07 DIAGNOSIS — I10 Essential (primary) hypertension: Secondary | ICD-10-CM

## 2011-09-07 LAB — BASIC METABOLIC PANEL
GFR: 114.93 mL/min (ref >60.00)
Potassium: 4 mEq/L (ref 3.5–5.1)
Sodium: 143 mEq/L (ref 135–145)

## 2011-09-07 MED ORDER — POTASSIUM CHLORIDE CRYS ER 20 MEQ PO TBCR: 20.0000 meq | EXTENDED_RELEASE_TABLET | Freq: Every day | ORAL | Status: DC

## 2011-09-07 MED ORDER — FUROSEMIDE 40 MG PO TABS: 40.0000 mg | ORAL_TABLET | Freq: Every day | ORAL | Status: DC

## 2011-09-07 NOTE — Progress Notes (Signed)
[address] [place], [place]  [address] Phone: [phone] Fax:  [phone]  Date:  09/07/2011   Name:  [name]   DOB:  1940-12-25   MRN:  [id number]  PCP:  Alva Garnet., MD  Primary Cardiologist:  Dr. Delane Ginger  Primary Electrophysiologist:  None    History of Present Illness: [name] is a 71 y.o. female who returns for evaluation of chest pain and dyspnea.    She has a history of CAD, status post CABG, DM2, HTN, HL.  Echo 02/2008: EF 55-60%, impaired LV relaxation, mild LVH, mild aortic sclerosis, small LVOT obstruction with SAM.  LHC 07/2009:  pLAD 60%, pRCA 30-40%, SVG to the RCA functionally occluded, SVG-diagonal patent, LIMA to the LAD atretic, EF 60%.  Her last nuclear study 01/2011: EF 72%, small fixed inferobasal defect suggestive of thinning, no ischemia.  Last seen 03/2011.  Patient was having continued episodes of chest discomfort.  She had been taking her isosorbide incorrectly and this was adjusted and she was asked to follow up in 6 months.  She saw pulmonology recently with worsening dyspnea.  PFTs demonstrated moderate to severe airflow obstruction with an FEV1 of 48% and FVC of 60%.  She did have positive response to bronchodilator.  Chest x-ray demonstrated cardiomegaly and central venous congestion with small effusions.  There was some concern for CHF and she was referred back for follow up.  She has noted fatigue over the last 1-1/2 months.  She's also had a chest pressure for the last month.  Chest pressure is fairly constant.  Exertion does make it worse.  She does get nauseated but denies radiation or associated diaphoresis.  She does note worsening dyspnea with exertion.  She feels that her asthma symptoms are improved with the addition of Spiriva (started by pulmonology).  She sleeps on 2 pillows.  This is chronic.  She has had some LE edema which seems to be fairly new for her.  She has a cough that is fairly chronic for her  asthma.  She does do water aerobics and has not noticed any more chest pain with this.  States her chest symptoms and fatigue remind her of her prior angina.  Wt Readings from Last 3 Encounters:  09/07/11 230 lb (104.327 kg)  08/17/11 232 lb 6.4 oz (105.416 kg)  07/19/11 230 lb (104.327 kg)      Past Medical History  Diagnosis Date  . Coronary artery disease   . Diabetes mellitus   . Hypertension   . Hyperlipidemia   . Shoulder injury   . Chest pain   . Acute myocardial infarction, unspecified site, episode of care unspecified   . Unspecified asthma     Current Outpatient Prescriptions  Medication Sig Dispense Refill  . acetaminophen (TYLENOL) 325 MG tablet Take 650 mg by mouth every 6 (six) hours as needed.        Marland Kitchen albuterol (PROVENTIL HFA;VENTOLIN HFA) 108 (90 BASE) MCG/ACT inhaler Inhale 2 puffs into the lungs every 6 (six) hours as needed.      Marland Kitchen albuterol (PROVENTIL) (2.5 MG/3ML) 0.083% nebulizer solution Take 2.5 mg by nebulization every 6 (six) hours as needed.      . ALPRAZolam (XANAX) 0.25 MG tablet Take 0.25 mg by mouth daily.        Marland Kitchen aspirin 325 MG tablet Take 325 mg by mouth daily.        Marland Kitchen atorvastatin (LIPITOR) 40 MG tablet Take 40  mg by mouth every other day.      . Calcium & Magnesium Carbonates (MYLANTA PO) Take by mouth daily. Pt states she is taking the generic store brand      . Cholecalciferol (VITAMIN D) 2000 UNITS tablet Take 2,000 Units by mouth daily.        . Cyanocobalamin (VITAMIN B-12 IJ) Inject as directed every 30 (thirty) days.        Marland Kitchen doxazosin (CARDURA) 8 MG tablet Take 1 tablet (8 mg total) by mouth at bedtime.  30 tablet  5  . famotidine (PEPCID) 20 MG tablet TAKE 1 TABLET BY MOUTH EVERY DAY  30 tablet  0  . Fluticasone-Salmeterol (ADVAIR DISKUS) 250-50 MCG/DOSE AEPB Inhale 1 puff into the lungs 2 (two) times daily.  60 each  6  . HUMALOG MIX 75/25 (75-25) 100 UNIT/ML SUSP 20 units in the am and 40 units at dinner      . levalbuterol (XOPENEX  HFA) 45 MCG/ACT inhaler Inhale 1-2 puffs into the lungs every 4 (four) hours as needed.        Marland Kitchen losartan (COZAAR) 100 MG tablet TAKE 1 TABLET BY MOUTH DAILY  30 tablet  0  . methocarbamol (ROBAXIN) 500 MG tablet Take 1 tablet by mouth Twice daily.      . Multiple Vitamin (MULTIVITAMIN) tablet Take 1 tablet by mouth daily.        . nebivolol (BYSTOLIC) 10 MG tablet Take 5 mg by mouth daily.        . nitroGLYCERIN (NITROSTAT) 0.4 MG SL tablet Place 0.4 mg under the tongue every 5 (five) minutes as needed.        Marland Kitchen omeprazole (PRILOSEC) 20 MG capsule Take 20 mg by mouth daily.      Marland Kitchen tiotropium (SPIRIVA HANDIHALER) 18 MCG inhalation capsule Place 1 capsule (18 mcg total) into inhaler and inhale daily.  30 capsule  5    Allergies: Allergies  Allergen Reactions  . Amlodipine   . Codeine   . Morphine   . Sulfonamide Derivatives     REACTION: swelling    History  Substance Use Topics  . Smoking status: Former Smoker -- 0.5 packs/day for 25 years    Types: Cigarettes    Quit date: 08/20/1982  . Smokeless tobacco: Never Used  . Alcohol Use: No     ROS:  Please see the history of present illness.     All other systems reviewed and negative.   PHYSICAL EXAM: VS:  BP 150/77  Pulse 86  Ht 5\' 3"  (1.6 m)  Wt 230 lb (104.327 kg)  BMI 40.74 kg/m2 Well nourished, well developed, in no acute distress HEENT: normal Neck: minimally elevated JVD Cardiac:  normal S1, S2; RRR; no murmur, no S3 Lungs:  Decreased breath sounds bilaterally, no wheezing, rhonchi or rales Abd: soft, nontender, no hepatomegaly Ext: trace bilateral LE edema Skin: warm and dry Neuro:  CNs 2-12 intact, no focal abnormalities noted  EKG:  NSR, heart rate 83, right bundle branch block, no change from prior tracing      ASSESSMENT AND PLAN:  1.  Chest pain Somewhat atypical.  She had a cardiac catheterization done in 2011 that demonstrated fairly stable anatomy.  She also had a low risk Myoview less than 12 months  ago. She does have some evidence on exam and chest x-ray for possible mild volume overload. I recommend proceeding with diuresis initially. I discussed her case further with Dr. Elease Hashimoto.  He agreed.  I will place her on Lasix 40 mg daily with potassium 20 mEq daily. Check a basic metabolic panel and BNP today.  Repeat a basic metabolic panel in one week. Follow up with Dr. Delane Ginger in several weeks. Of note, she could not tolerate imdur due to headache.  Consider norvasc as an antianginal, if needed.  2.  Dyspnea As noted above, start Lasix. I will also obtain a 2-D echocardiogram. She also has fairly significant asthma.  I suspect her dyspnea is multifactorial.  3.  CAD Continue aspirin, statin. Proceed with evaluation and management as outlined above. She continues to have persistent symptoms without improvement, further testing may be warranted.  4.  Asthma Management per pulmonology.  5.  Hypertension We will see how her blood pressure response to the addition of Lasix.  Signed, Tereso Newcomer, PA-C  11:56 AM 09/07/2011

## 2011-09-07 NOTE — Patient Instructions (Addendum)
Your physician has recommended you make the following change in your medication: START LASIX 40 MG DAILY;  START POTASSIUM 20 MEQ DAILY   Your physician recommends that you return for lab work in: TODAY BMET, BNP  Your physician recommends that you return for lab work in: 1 WEEK 09/14/11 REPEAT BMET   Your physician recommends that you schedule a follow-up appointment in: 2 WEEKS WITH DR. Elease Hashimoto

## 2011-09-08 LAB — BRAIN NATRIURETIC PEPTIDE: Pro B Natriuretic peptide (BNP): 109 pg/mL — ABNORMAL HIGH (ref 0.0–100.0)

## 2011-09-09 ENCOUNTER — Telehealth: Payer: Self-pay | Admitting: *Deleted

## 2011-09-09 NOTE — Telephone Encounter (Signed)
Message copied by Tarri Fuller on Thu Sep 09, 2011  3:37 PM ------      Message from: [place], [place] T      Created: Wed Sep 08, 2011  5:51 PM       Labs ok      Continue with current treatment plan.      Tereso Newcomer, PA-C  5:51 PM 09/08/2011

## 2011-09-09 NOTE — Telephone Encounter (Signed)
patient notified lab results gave verbal understanding

## 2011-09-13 ENCOUNTER — Other Ambulatory Visit (INDEPENDENT_AMBULATORY_CARE_PROVIDER_SITE_OTHER): Payer: Medicare Other

## 2011-09-13 ENCOUNTER — Ambulatory Visit (HOSPITAL_COMMUNITY): Payer: Medicare Other | Attending: Cardiology | Admitting: Radiology

## 2011-09-13 DIAGNOSIS — J45909 Unspecified asthma, uncomplicated: Secondary | ICD-10-CM | POA: Insufficient documentation

## 2011-09-13 DIAGNOSIS — I10 Essential (primary) hypertension: Secondary | ICD-10-CM | POA: Insufficient documentation

## 2011-09-13 DIAGNOSIS — R0609 Other forms of dyspnea: Secondary | ICD-10-CM | POA: Insufficient documentation

## 2011-09-13 DIAGNOSIS — R059 Cough, unspecified: Secondary | ICD-10-CM

## 2011-09-13 DIAGNOSIS — R05 Cough: Secondary | ICD-10-CM

## 2011-09-13 DIAGNOSIS — R0602 Shortness of breath: Secondary | ICD-10-CM

## 2011-09-13 DIAGNOSIS — R0989 Other specified symptoms and signs involving the circulatory and respiratory systems: Secondary | ICD-10-CM

## 2011-09-13 DIAGNOSIS — I2510 Atherosclerotic heart disease of native coronary artery without angina pectoris: Secondary | ICD-10-CM | POA: Insufficient documentation

## 2011-09-13 NOTE — Progress Notes (Signed)
Echocardiogram performed.  

## 2011-09-14 ENCOUNTER — Other Ambulatory Visit: Payer: Medicare Other

## 2011-09-14 LAB — BASIC METABOLIC PANEL
Chloride: 99 mEq/L (ref 96–112)
Creatinine, Ser: 0.9 mg/dL (ref 0.4–1.2)
Potassium: 4.6 mEq/L (ref 3.5–5.1)

## 2011-09-27 ENCOUNTER — Telehealth: Payer: Self-pay | Admitting: Cardiovascular Disease

## 2011-09-27 NOTE — Telephone Encounter (Signed)
Needs cardiac clearance letter and app/ pt has app this week, pt having knee procedure under general,

## 2011-09-27 NOTE — Telephone Encounter (Signed)
New Problem:    Patient called in needing surgical clearance faxed to Dr. Bryson Ha for a procedure she has coming up.  AttnImelda Pillow Fax [phone]. Please call back.

## 2011-09-30 ENCOUNTER — Encounter: Payer: Self-pay | Admitting: *Deleted

## 2011-09-30 ENCOUNTER — Encounter: Payer: Self-pay | Admitting: Cardiovascular Disease

## 2011-09-30 ENCOUNTER — Ambulatory Visit (INDEPENDENT_AMBULATORY_CARE_PROVIDER_SITE_OTHER): Payer: Medicare Other | Admitting: Cardiovascular Disease

## 2011-09-30 VITALS — BP 112/62 | HR 89 | Ht 63.0 in | Wt 225.0 lb

## 2011-09-30 DIAGNOSIS — I219 Acute myocardial infarction, unspecified: Secondary | ICD-10-CM

## 2011-09-30 DIAGNOSIS — I2510 Atherosclerotic heart disease of native coronary artery without angina pectoris: Secondary | ICD-10-CM

## 2011-09-30 NOTE — Progress Notes (Signed)
[name] Date of Birth  [birth year] [facility] Office  [address]    [address]   [address] [place], [place]  [address]    [place], [place]  [address] [phone]  Fax  [phone]  [phone]  Fax [phone]  Problem list: 1. Coronary artery disease-status post CABG, the saphenous vein graft to the right coronary artery is severely diseased but she has good flow to the distal right coronary artery from the native circulation. 2. Diabetes mellitus 3. Hypertension 4. Hyperlipidemia 5. COPD: June, 2013 - FEV1 equals 0.89 L which is 48% of predicted value. Her DLCO is also markedly depressed. 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   History of Present Illness:  [name] is a 71 y.o. female with the above noted hx.  She continues to have episodes of sharp pain ( few seconds)  followed by a burning pain ( last 6-7 minutes).  She usually does not take NTG because it causes a headache.  She was started on Lasix earlier this year. She seems to be doing much better on Lasix. She's had only function test which revealed severe obstructive lung disease with a good initial response to bronchodilators.  She is scheduled to have surgery all right knee. She's here today for preoperative evaluation prior to having the surgery.    Current Outpatient Prescriptions on File Prior to Visit  Medication Sig Dispense Refill  . acetaminophen (TYLENOL) 325 MG tablet Take 650 mg by mouth every 6 (six) hours as needed.        Marland Kitchen albuterol (PROVENTIL HFA;VENTOLIN HFA) 108 (90 BASE) MCG/ACT inhaler Inhale 2 puffs into the lungs every 6 (six) hours as needed.      Marland Kitchen albuterol (PROVENTIL) (2.5 MG/3ML) 0.083% nebulizer solution Take 2.5 mg by nebulization every 6 (six) hours as needed.      . ALPRAZolam (XANAX) 0.25 MG tablet Take 0.25 mg by mouth daily.        Marland Kitchen aspirin 325 MG tablet Take 325 mg by mouth daily.        Marland Kitchen  atorvastatin (LIPITOR) 40 MG tablet Take 40 mg by mouth every other day.      . Calcium & Magnesium Carbonates (MYLANTA PO) Take by mouth daily. Pt states she is taking the generic store brand      . Cholecalciferol (VITAMIN D) 2000 UNITS tablet Take 2,000 Units by mouth daily.        . Cyanocobalamin (VITAMIN B-12 IJ) Inject as directed every 30 (thirty) days.        Marland Kitchen doxazosin (CARDURA) 8 MG tablet Take 1 tablet (8 mg total) by mouth at bedtime.  30 tablet  5  . famotidine (PEPCID) 20 MG tablet TAKE 1 TABLET BY MOUTH EVERY DAY  30 tablet  0  . Fluticasone-Salmeterol (ADVAIR DISKUS) 250-50 MCG/DOSE AEPB Inhale 1 puff into the lungs 2 (two) times daily.  60 each  6  . furosemide (LASIX) 40 MG tablet Take 1 tablet (40 mg total) by mouth daily.  30 tablet  11  . HUMALOG MIX 75/25 (75-25) 100 UNIT/ML SUSP 20 units in the am and 40 units at dinner      . levalbuterol (XOPENEX HFA) 45 MCG/ACT inhaler Inhale 1-2 puffs into the lungs every 4 (four) hours as needed.        Marland Kitchen losartan (COZAAR) 100 MG tablet TAKE 1 TABLET BY MOUTH DAILY  30 tablet  0  . methocarbamol (ROBAXIN) 500 MG tablet Take 1 tablet by mouth Twice daily.      . Multiple Vitamin (MULTIVITAMIN) tablet Take 1 tablet by mouth daily.        . nebivolol (BYSTOLIC) 10 MG tablet Take 5 mg by mouth daily.        . nitroGLYCERIN (NITROSTAT) 0.4 MG SL tablet Place 0.4 mg under the tongue every 5 (five) minutes as needed.        Marland Kitchen omeprazole (PRILOSEC) 20 MG capsule Take 20 mg by mouth daily.      . potassium chloride SA (K-DUR,KLOR-CON) 20 MEQ tablet Take 1 tablet (20 mEq total) by mouth daily.  30 tablet  11  . SitaGLIPtin Phosphate (JANUVIA PO) Take by mouth daily.      Marland Kitchen tiotropium (SPIRIVA HANDIHALER) 18 MCG inhalation capsule Place 1 capsule (18 mcg total) into inhaler and inhale daily.  30 capsule  5    Allergies  Allergen Reactions  . Amlodipine   . Codeine   . Morphine   . Sulfonamide Derivatives     REACTION: swelling    Past  Medical History  Diagnosis Date  . Coronary artery disease   . Diabetes mellitus   . Hypertension   . Hyperlipidemia   . Shoulder injury   . Chest pain   . Acute myocardial infarction, unspecified site, episode of care unspecified   . Unspecified asthma     Past Surgical History  Procedure Date  . Cardiac catheterization 07/23/2009    EF 60%  . Cardiac catheterization 10/11/2008  . Cardiac catheterization 03/01/2007    EF 75-80%  . Coronary artery bypass graft     SEVERELY DISEASED SAPHENOUS VEIN GRAFT TO THE RIGHT CORONARY ARTERY BUT WITH FAIRLY WELL PRESERVED FLOW TO THE DISTAL RIGHT CORONARY ARTERY FROM THE NATIVE CIRCULATION-RESTART  CATH IN JUNE 2000, REVEALS MILD/MODERATE  CAD WITH GOOD FLOW DOWN HER LAD  . US echocardiography 03/08/2008    EF 55-60%  . Cardiovascular stress test 11/15/2007    EF 60%  . Rotator cuff repair     right and left  . Tumor removed kidney     History  Smoking status  . Former Smoker -- 0.5 packs/day for 25 years  . Types: Cigarettes  . Quit date: 08/20/1982  Smokeless tobacco  . Never Used    History  Alcohol Use No    Family History  Problem Relation Age of Onset  . Emphysema Paternal Uncle   . Heart disease Maternal Grandfather   . Esophageal cancer Brother     Reviw of Systems:  Reviewed in the HPI.  All other systems are negative.  Physical Exam: Blood pressure 112/62, pulse 89, height 5\' 3"  (1.6 m), weight 225 lb (102.059 kg). General: Well developed, well nourished, in no acute distress.  Head: Normocephalic, atraumatic, sclera non-icteric, mucus membranes are moist,   Neck: Supple. Negative for carotid bruits. JVD not elevated.  Lungs: Clear bilaterally to auscultation without wheezes, rales, or rhonchi. Breathing is unlabored.  Heart: RRR with S1 S2. No murmurs, rubs, or gallops  Abdomen: Soft, non-tender, non-distended with normoactive bowel sounds. No hepatomegaly. No rebound/guarding. No obvious abdominal  masses.  Msk:  Strength and tone appear normal for age.  Extremities: No clubbing or cyanosis. No edema.  Distal pedal pulses are 2+ and equal bilaterally.  She has extremely long fingernails  Neuro: Alert and oriented X 3. Moves all extremities spontaneously.  Psych:  Responds  to questions appropriately with a normal affect.  ECG: September 30, 2011 - NSR at 12.  RBBB.  Assessment / Plan:

## 2011-09-30 NOTE — Assessment & Plan Note (Addendum)
[name] is doing well from a cardiac standpoint. She has not had any episodes of chest pain or shortness breath. She seems to be doing quite a bit better since being started on Lasix.  She's not had any recurrent ischemia.  She has been seen by the pulmonologist. She's been found to have severe obstructive airways disease. This may be the cause of most of her dyspnea.  She needs to have  knee surgery. From a cardiovascular standpoint she is at moderate risk for any cardiovascular complications.  She may need pulmonary evaluation/clearance prior to her knee surgery. I will see  her again in 6 months for followup office visit.

## 2011-09-30 NOTE — Telephone Encounter (Signed)
done

## 2011-09-30 NOTE — Patient Instructions (Addendum)
Your physician wants you to follow-up in: 6 months  You will receive a reminder letter in the mail two months in advance. If you don't receive a letter, please call our office to schedule the follow-up appointment.  You will be cleared for surgery and letter will be sent today.

## 2011-10-05 ENCOUNTER — Emergency Department (HOSPITAL_COMMUNITY): Payer: Medicare Other

## 2011-10-05 ENCOUNTER — Telehealth: Payer: Self-pay | Admitting: Critical Care Medicine

## 2011-10-05 ENCOUNTER — Inpatient Hospital Stay (HOSPITAL_COMMUNITY)
Admission: EM | Admit: 2011-10-05 | Discharge: 2011-10-06 | DRG: 069 | Disposition: A | Discharge status: Home / Self Care | Payer: Medicare Other | Attending: Internal Medicine | Admitting: Internal Medicine

## 2011-10-05 DIAGNOSIS — J45909 Unspecified asthma, uncomplicated: Secondary | ICD-10-CM

## 2011-10-05 DIAGNOSIS — R0989 Other specified symptoms and signs involving the circulatory and respiratory systems: Secondary | ICD-10-CM | POA: Diagnosis present

## 2011-10-05 DIAGNOSIS — R0602 Shortness of breath: Secondary | ICD-10-CM

## 2011-10-05 DIAGNOSIS — E119 Type 2 diabetes mellitus without complications: Secondary | ICD-10-CM

## 2011-10-05 DIAGNOSIS — R059 Cough, unspecified: Secondary | ICD-10-CM

## 2011-10-05 DIAGNOSIS — I219 Acute myocardial infarction, unspecified: Secondary | ICD-10-CM

## 2011-10-05 DIAGNOSIS — I2510 Atherosclerotic heart disease of native coronary artery without angina pectoris: Secondary | ICD-10-CM

## 2011-10-05 DIAGNOSIS — G459 Transient cerebral ischemic attack, unspecified: Principal | ICD-10-CM | POA: Diagnosis present

## 2011-10-05 DIAGNOSIS — E785 Hyperlipidemia, unspecified: Secondary | ICD-10-CM

## 2011-10-05 DIAGNOSIS — K219 Gastro-esophageal reflux disease without esophagitis: Secondary | ICD-10-CM

## 2011-10-05 DIAGNOSIS — R252 Cramp and spasm: Secondary | ICD-10-CM

## 2011-10-05 DIAGNOSIS — I10 Essential (primary) hypertension: Secondary | ICD-10-CM | POA: Diagnosis present

## 2011-10-05 DIAGNOSIS — R42 Dizziness and giddiness: Secondary | ICD-10-CM

## 2011-10-05 DIAGNOSIS — R05 Cough: Secondary | ICD-10-CM

## 2011-10-05 DIAGNOSIS — G450 Vertebro-basilar artery syndrome: Secondary | ICD-10-CM | POA: Diagnosis present

## 2011-10-05 DIAGNOSIS — Z7982 Long term (current) use of aspirin: Secondary | ICD-10-CM

## 2011-10-05 LAB — URINALYSIS, ROUTINE W REFLEX MICROSCOPIC
Bilirubin Urine: NEGATIVE
Glucose, UA: NEGATIVE mg/dL
Hgb urine dipstick: NEGATIVE
Ketones, ur: NEGATIVE mg/dL
pH: 5.5 (ref 5.0–8.0)

## 2011-10-05 LAB — COMPREHENSIVE METABOLIC PANEL
BUN: 23 mg/dL (ref 6–23)
CO2: 32 mEq/L (ref 19–32)
Chloride: 100 mEq/L (ref 96–112)
Creatinine, Ser: 0.79 mg/dL (ref 0.50–1.10)
GFR calc non Af Amer: 81 mL/min — ABNORMAL LOW (ref >90)
Total Bilirubin: 0.2 mg/dL — ABNORMAL LOW (ref 0.3–1.2)

## 2011-10-05 LAB — CBC WITH DIFFERENTIAL/PLATELET
Basophils Absolute: 0 10*3/uL (ref 0.0–0.1)
Eosinophils Absolute: 0.2 10*3/uL (ref 0.0–0.7)
Lymphocytes Relative: 28 % (ref 12–46)
Lymphs Abs: 2.2 10*3/uL (ref 0.7–4.0)
MCH: 23.7 pg — ABNORMAL LOW (ref 26.0–34.0)
Neutrophils Relative %: 63 % (ref 43–77)
Platelets: 287 10*3/uL (ref 150–400)
RBC: 4.27 MIL/uL (ref 3.87–5.11)
RDW: 17.5 % — ABNORMAL HIGH (ref 11.5–15.5)
WBC: 8 10*3/uL (ref 4.0–10.5)

## 2011-10-05 MED ORDER — SODIUM CHLORIDE 0.9 % IV SOLN
Freq: Once | INTRAVENOUS | Status: AC
  Administered 2011-10-05: 20:00:00 via INTRAVENOUS

## 2011-10-05 MED ORDER — MECLIZINE HCL 25 MG PO TABS
12.5000 mg | ORAL_TABLET | Freq: Once | ORAL | Status: AC
  Administered 2011-10-05: 12.5 mg via ORAL
  Filled 2011-10-05: qty 1

## 2011-10-05 MED ORDER — ONDANSETRON HCL 4 MG/2ML IJ SOLN
4.0000 mg | Freq: Once | INTRAMUSCULAR | Status: AC
  Administered 2011-10-05: 4 mg via INTRAVENOUS
  Filled 2011-10-05: qty 2

## 2011-10-05 NOTE — ED Notes (Signed)
Started 45 minutes ago, dizziness, blurry vision, nausea-- No slurred speech, No facial droop,

## 2011-10-05 NOTE — Telephone Encounter (Signed)
Noted.  Will have PW address form today while in office.  [email or web address] to inform pt of this.

## 2011-10-05 NOTE — Telephone Encounter (Signed)
Pt called in to check on the status of this surgery clearance.  Pt states she is in pain & wants to get the surgery scheduled, but is not able to do this w/out PW's clearance.  Pt asked to be reached at [phone].  Antionette Fairy'

## 2011-10-05 NOTE — Telephone Encounter (Signed)
Form completed by Dr. Delford Field and faxed back to [place] at [phone].    [name] with Dr. Darrelyn Hillock aware.     Pt aware as well.

## 2011-10-05 NOTE — ED Notes (Signed)
Pt remains on monitor & continuous pulse ox, oximetry is on right great toe d/t pt's elongated fingernails.

## 2011-10-05 NOTE — ED Notes (Signed)
Vomiting at triage 

## 2011-10-05 NOTE — Telephone Encounter (Signed)
[name] has form and will give to Dr. Delford Field when he returns to the office this afternoon. Will forward to Dr. Delford Field so he is aware. Please advise thanks

## 2011-10-05 NOTE — ED Notes (Signed)
Pt c/o dizziness & blurred vision.

## 2011-10-05 NOTE — ED Notes (Signed)
Patient transported to radiology.will obtain labs upon patient's return

## 2011-10-05 NOTE — Telephone Encounter (Signed)
noted 

## 2011-10-05 NOTE — H&P (Signed)
[facility] History and Physical  CYDNEY ALVARENGA ZOX:[id number] DOB: [birth year] DOA: 10/05/2011  Referring physician: Dr. Rubin Payor PCP: Alva Garnet., MD   Chief Complaint: vertigo  HPI: PRAJNA VANDERPOOL is a 71 y.o. female with past medical history significant for hypertension and diabetes mellitus,coronary artery disease who presents with above complaints.she states that she was in her usual state of health until this afternoon when she began having vertigo-felt like everything in the room was moving and she was having difficulty with balance. She also states that she was on able to focus//blurry vision.she also admits to associated nausea and vomiting. she denies focal weakness, dysphagia, slurred speech and no headaches.she was seen in the ER and a CT scan of her brain was done and came back negative. In the ED she was given Antivert with improvement of her symptoms.She is admitted for further evaluation and management.   Review of Systems: The patient denies anorexia, fever, weight loss,, vision loss, decreased hearing, hoarseness, chest pain, syncope, dyspnea on exertion, peripheral edema, hemoptysis, abdominal pain, melena, hematochezia, severe indigestion/heartburn, hematuria, incontinence, muscle weakness, transient blindness, depression, unusual weight change, abnormal bleeding, enlarged lymph nodes.    Past Medical History  Diagnosis Date  . Coronary artery disease   . Diabetes mellitus   . Hypertension   . Hyperlipidemia   . Shoulder injury   . Chest pain   . Acute myocardial infarction, unspecified site, episode of care unspecified   . Unspecified asthma    Past Surgical History  Procedure Date  . Cardiac catheterization 07/23/2009    EF 60%  . Cardiac catheterization 10/11/2008  . Cardiac catheterization 03/01/2007    EF 75-80%  . Coronary artery bypass graft     SEVERELY DISEASED SAPHENOUS VEIN GRAFT TO THE RIGHT CORONARY ARTERY BUT WITH FAIRLY WELL PRESERVED FLOW  TO THE DISTAL RIGHT CORONARY ARTERY FROM THE NATIVE CIRCULATION-RESTART  CATH IN JUNE 2000, REVEALS MILD/MODERATE  CAD WITH GOOD FLOW DOWN HER LAD  . US echocardiography 03/08/2008    EF 55-60%  . Cardiovascular stress test 11/15/2007    EF 60%  . Rotator cuff repair     right and left  . Tumor removed kidney    Social History:  reports that she quit smoking about 29 years ago. Her smoking use included Cigarettes. She has a 12.5 pack-year smoking history. She has never used smokeless tobacco. She reports that she does not drink alcohol or use illicit drugs. where does patient live--home Can patient participate in ADLs - yes  Allergies  Allergen Reactions  . Amlodipine   . Codeine   . Morphine   . Sulfonamide Derivatives     REACTION: swelling    Family History  Problem Relation Age of Onset  . Emphysema Paternal Uncle   . Heart disease Maternal Grandfather   . Esophageal cancer Brother    Prior to Admission medications   Medication Sig Start Date End Date Taking? Authorizing Provider  albuterol (PROVENTIL HFA;VENTOLIN HFA) 108 (90 BASE) MCG/ACT inhaler Inhale 2 puffs into the lungs every 6 (six) hours as needed. For shortness of breath.   Yes Historical Provider, MD  albuterol (PROVENTIL) (2.5 MG/3ML) 0.083% nebulizer solution Take 2.5 mg by nebulization every 6 (six) hours as needed. For shortness of breath.   Yes Historical Provider, MD  ALPRAZolam (XANAX) 0.25 MG tablet Take 0.25 mg by mouth at bedtime.    Yes Historical Provider, MD  aspirin 325 MG tablet Take 325 mg by mouth daily.  Yes Historical Provider, MD  atorvastatin (LIPITOR) 40 MG tablet Take 40 mg by mouth every other day. 04/06/11 04/05/12 Yes Vesta Mixer, MD  Calcium & Magnesium Carbonates (MYLANTA PO) Take 1 tablet by mouth daily as needed. For indigestion.   Yes Historical Provider, MD  Cholecalciferol (VITAMIN D) 2000 UNITS tablet Take 2,000 Units by mouth daily.     Yes Historical Provider, MD    cyanocobalamin (,VITAMIN B-12,) 1000 MCG/ML injection Inject 1,000 mcg into the muscle every 30 (thirty) days.   Yes Historical Provider, MD  doxazosin (CARDURA) 8 MG tablet Take 1 tablet (8 mg total) by mouth at bedtime. 05/05/11  Yes Vesta Mixer, MD  famotidine (PEPCID) 20 MG tablet Take 20 mg by mouth daily as needed. For indigestion.   Yes Historical Provider, MD  Fluticasone-Salmeterol (ADVAIR DISKUS) 250-50 MCG/DOSE AEPB Inhale 1 puff into the lungs 2 (two) times daily. 07/19/11  Yes Storm Frisk, MD  furosemide (LASIX) 40 MG tablet Take 1 tablet (40 mg total) by mouth daily. 09/07/11 09/06/12 Yes Scott Moishe Spice, PA  insulin lispro protamine-insulin lispro (HUMALOG 75/25) (75-25) 100 UNIT/ML SUSP Inject 20-45 Units into the skin 2 (two) times daily with a meal. 20 in am, 45 at dinner   Yes Historical Provider, MD  levalbuterol (XOPENEX HFA) 45 MCG/ACT inhaler Inhale 1-2 puffs into the lungs every 4 (four) hours as needed. For shortness of breath.   Yes Historical Provider, MD  losartan (COZAAR) 100 MG tablet TAKE 1 TABLET BY MOUTH DAILY 04/10/11  Yes Storm Frisk, MD  methocarbamol (ROBAXIN) 500 MG tablet Take 1 tablet by mouth 2 (two) times daily.    Yes Historical Provider, MD  Multiple Vitamin (MULTIVITAMIN WITH MINERALS) TABS Take 1 tablet by mouth daily.   Yes Historical Provider, MD  nebivolol (BYSTOLIC) 10 MG tablet Take 5 mg by mouth daily.     Yes Historical Provider, MD  nitroGLYCERIN (NITROSTAT) 0.4 MG SL tablet Place 0.4 mg under the tongue every 5 (five) minutes x 3 doses as needed. For chest pain.   Yes Historical Provider, MD  omeprazole (PRILOSEC) 20 MG capsule Take 20 mg by mouth daily as needed. For indigestion.   Yes Historical Provider, MD  potassium chloride SA (K-DUR,KLOR-CON) 20 MEQ tablet Take 1 tablet (20 mEq total) by mouth daily. 09/07/11 09/06/12 Yes Scott Moishe Spice, PA  sitaGLIPtin (JANUVIA) 100 MG tablet Take 100 mg by mouth daily.   Yes Historical Provider, MD   tiotropium (SPIRIVA HANDIHALER) 18 MCG inhalation capsule Place 1 capsule (18 mcg total) into inhaler and inhale daily. 08/17/11 08/16/12 Yes Tammy S Parrett, NP  traMADol (ULTRAM) 50 MG tablet Take 50 mg by mouth every 6 (six) hours as needed. For pain. 09/24/11  Yes Historical Provider, MD   Physical Exam: Filed Vitals:   10/05/11 2130 10/05/11 2200 10/05/11 2230 10/05/11 2300  BP: 111/60 120/73 98/41 111/56  Pulse: 70 73 69 72  Temp:      TempSrc:      Resp:      SpO2: 92% 98% 100% 100%   Constitutional: Vital signs reviewed.  Patient is a well-developed and well-nourished elderly black female in no acute distress and cooperative with exam. Alert and oriented x3.  Head: Normocephalic and atraumatic Mouth: no erythema or exudates, MMM Eyes: PERRL, EOMI, conjunctivae normal, No scleral icterus.no nystagmus is noted.  Neck: Supple, Trachea midline normal ROM, No JVD, mass, thyromegaly, or carotid bruit present.  Cardiovascular: RRR, S1 normal, S2  normal, no MRG, pulses symmetric and intact bilaterally Pulmonary/Chest: CTAB, no wheezes, rales, or rhonchi Abdominal: Soft. Non-tender, non-distended, bowel sounds are normal, no masses, organomegaly, or guarding present.  Extremities: No cyanosis and no edema. Neurological: A&O x3, Strength is normal and symmetric bilaterally, cranial nerve II-XII are grossly intact, no focal motor deficit, sensory intact to light touch bilaterally.  Skin: Warm, dry and intact. No rash, cyanosis, or clubbing.  Psychiatric: Normal mood and affect. speech and behavior is normal. Judgment and thought content normal. Cognition and memory are normal.     Labs on Admission:  Basic Metabolic Panel:  Lab 10/05/11 1610  NA 141  K 3.8  CL 100  CO2 32  GLUCOSE 95  BUN 23  CREATININE 0.79  CALCIUM 9.1  MG --  PHOS --   Liver Function Tests:  Lab 10/05/11 1920  AST 13  ALT 11  ALKPHOS 132*  BILITOT 0.2*  PROT 6.9  ALBUMIN 3.6   No results found for  this basename: LIPASE:5,AMYLASE:5 in the last 168 hours No results found for this basename: AMMONIA:5 in the last 168 hours CBC:  Lab 10/05/11 1920  WBC 8.0  NEUTROABS 5.1  HGB 10.1*  HCT 31.8*  MCV 74.5*  PLT 287   Cardiac Enzymes: No results found for this basename: CKTOTAL:5,CKMB:5,CKMBINDEX:5,TROPONINI:5 in the last 168 hours  BNP (last 3 results)  Basename 09/07/11 1243  PROBNP 109.0*   CBG: No results found for this basename: GLUCAP:5 in the last 168 hours  Radiological Exams on Admission: Dg Chest 2 View  10/05/2011  *RADIOLOGY REPORT*  Clinical Data: Vertigo  CHEST - 2 VIEW  Comparison: 08/17/2011  Findings: Lungs are essentially clear. No pleural effusion or pneumothorax.  Stable cardiomegaly. Postsurgical changes related to prior CABG.  Mild degenerative changes of the visualized thoracolumbar spine. Right shoulder arthroplasty.  IMPRESSION: No evidence of acute cardiopulmonary disease.  Stable cardiomegaly.   Original Report Authenticated By: Charline Bills, M.D.    Ct Head Wo Contrast  10/05/2011  *RADIOLOGY REPORT*  Clinical Data: Dizziness, blurred vision  CT HEAD WITHOUT CONTRAST  Technique:  Contiguous axial images were obtained from the base of the skull through the vertex without contrast.  Comparison: MRI brain dated 05/28/2004  Findings: No evidence of parenchymal hemorrhage or extra-axial fluid collection. No mass lesion, mass effect, or midline shift.  No CT evidence of acute infarction.  Cerebral volume is age appropriate.  No ventriculomegaly.  Empty sella, unchanged.  The visualized paranasal sinuses are essentially clear. The mastoid air cells are unopacified.  Postsurgical changes involving the anterior wall of the left maxillary sinus.  No evidence of calvarial fracture.  IMPRESSION: No evidence of acute intracranial abnormality.   Original Report Authenticated By: Charline Bills, M.D.     EKG: Independently reviewed. Normal sinus rhythm, right bundle  branch block, no acute ischemic changes.  Assessment/Plan Principal Problem:  *Vertigo -as discussed above in patient with risk factors for CVA in- vertebrobasilar CVA/TIA not ruled out, vs BPV -will continue aspirin for now, order MRI/MRA/CVA workup follow and consider consultation pending results. -PTOT consult for vestibular PT Active Problems:  DIABETES, TYPE 2 -monitor Accu-Cheks, continue outpatient insulin and Januvia. We'll also obtain hemoglobin A1c.  HTN (hypertension), benign -patient mildly hypotensive initially in the ED, improved with IV fluids. Will hold off Cozaar and doxazosin for now. Continue nevibulol with hold parameters. History of coronary artery disease -she was chest pain free, continue outpatient medications and follow. History of asthma -stable, continue  outpatient medications.   Code Status: full Family Communication: family at bedside Disposition Plan:admit to telemetry  Time spent: >9mins  Kela Millin [facility] Pager [phone]  If 7PM-7AM, please contact night-coverage [email or web address] Password [facility] 10/05/2011, 11:57 PM

## 2011-10-05 NOTE — ED Provider Notes (Signed)
History     CSN: [id number]  Arrival date & time 10/05/11  1758   First MD Initiated Contact with Patient 10/05/11 1829      Chief Complaint  Patient presents with  . Dizziness  . Blurred Vision  . Nausea    (Consider location/radiation/quality/duration/timing/severity/associated sxs/prior treatment) The history is provided by the patient.   patient with dizziness that began around 45 minutes prior to arrival. She states she is watching TV and began to have difficulty focusing her eyes and felt the room spinning. She's had nauseousness with it. No headache. No slurred speech. No facial. No localized numbness or weakness. She's not had episodes like this before. No ringing or ears. No recent viral illness.  Past Medical History  Diagnosis Date  . Coronary artery disease   . Diabetes mellitus   . Hypertension   . Hyperlipidemia   . Shoulder injury   . Chest pain   . Acute myocardial infarction, unspecified site, episode of care unspecified   . Unspecified asthma     Past Surgical History  Procedure Date  . Cardiac catheterization 07/23/2009    EF 60%  . Cardiac catheterization 10/11/2008  . Cardiac catheterization 03/01/2007    EF 75-80%  . Coronary artery bypass graft     SEVERELY DISEASED SAPHENOUS VEIN GRAFT TO THE RIGHT CORONARY ARTERY BUT WITH FAIRLY WELL PRESERVED FLOW TO THE DISTAL RIGHT CORONARY ARTERY FROM THE NATIVE CIRCULATION-RESTART  CATH IN JUNE 2000, REVEALS MILD/MODERATE  CAD WITH GOOD FLOW DOWN HER LAD  . US echocardiography 03/08/2008    EF 55-60%  . Cardiovascular stress test 11/15/2007    EF 60%  . Rotator cuff repair     right and left  . Tumor removed kidney     Family History  Problem Relation Age of Onset  . Emphysema Paternal Uncle   . Heart disease Maternal Grandfather   . Esophageal cancer Brother     History  Substance Use Topics  . Smoking status: Former Smoker -- 0.5 packs/day for 25 years    Types: Cigarettes    Quit date: 08/20/1982   . Smokeless tobacco: Never Used  . Alcohol Use: No    OB History    Grav Para Term Preterm Abortions TAB SAB Ect Mult Living                  Review of Systems  Constitutional: Negative for activity change and appetite change.  HENT: Negative for neck stiffness.   Eyes: Positive for visual disturbance. Negative for pain.  Respiratory: Negative for chest tightness and shortness of breath.   Cardiovascular: Negative for chest pain and leg swelling.  Gastrointestinal: Positive for nausea. Negative for vomiting, abdominal pain and diarrhea.  Genitourinary: Negative for flank pain.  Musculoskeletal: Negative for back pain.  Skin: Negative for rash.  Neurological: Positive for dizziness. Negative for weakness, numbness and headaches.  Psychiatric/Behavioral: Negative for behavioral problems.    Allergies  Amlodipine; Codeine; Morphine; and Sulfonamide derivatives  Home Medications   Current Outpatient Rx  Name Route Sig Dispense Refill  . ALBUTEROL SULFATE HFA 108 (90 BASE) MCG/ACT IN AERS Inhalation Inhale 2 puffs into the lungs every 6 (six) hours as needed. For shortness of breath.    . ALBUTEROL SULFATE (2.5 MG/3ML) 0.083% IN NEBU Nebulization Take 2.5 mg by nebulization every 6 (six) hours as needed. For shortness of breath.    . ALPRAZOLAM 0.25 MG PO TABS Oral Take 0.25 mg by mouth at bedtime.     Marland Kitchen  ASPIRIN 325 MG PO TABS Oral Take 325 mg by mouth daily.      . ATORVASTATIN CALCIUM 40 MG PO TABS Oral Take 40 mg by mouth every other day.    Marland Kitchen MYLANTA PO Oral Take 1 tablet by mouth daily as needed. For indigestion.    Marland Kitchen VITAMIN D 2000 UNITS PO TABS Oral Take 2,000 Units by mouth daily.      . CYANOCOBALAMIN 1000 MCG/ML IJ SOLN Intramuscular Inject 1,000 mcg into the muscle every 30 (thirty) days.    Marland Kitchen DOXAZOSIN MESYLATE 8 MG PO TABS Oral Take 1 tablet (8 mg total) by mouth at bedtime. 30 tablet 5  . FAMOTIDINE 20 MG PO TABS Oral Take 20 mg by mouth daily as needed. For  indigestion.    Marland Kitchen FLUTICASONE-SALMETEROL 250-50 MCG/DOSE IN AEPB Inhalation Inhale 1 puff into the lungs 2 (two) times daily. 60 each 6  . FUROSEMIDE 40 MG PO TABS Oral Take 1 tablet (40 mg total) by mouth daily. 30 tablet 11  . INSULIN LISPRO PROT & LISPRO (75-25) 100 UNIT/ML [place] SUSP Subcutaneous Inject 20-45 Units into the skin 2 (two) times daily with a meal. 20 in am, 45 at dinner    . LEVALBUTEROL TARTRATE 45 MCG/ACT IN AERO Inhalation Inhale 1-2 puffs into the lungs every 4 (four) hours as needed. For shortness of breath.    Marland Kitchen LOSARTAN POTASSIUM 100 MG PO TABS  TAKE 1 TABLET BY MOUTH DAILY 30 tablet 0    Please forward future refill requests to patient's ...  . METHOCARBAMOL 500 MG PO TABS Oral Take 1 tablet by mouth 2 (two) times daily.     . ADULT MULTIVITAMIN W/MINERALS CH Oral Take 1 tablet by mouth daily.    . NEBIVOLOL HCL 10 MG PO TABS Oral Take 5 mg by mouth daily.      Marland Kitchen NITROGLYCERIN 0.4 MG SL SUBL Sublingual Place 0.4 mg under the tongue every 5 (five) minutes x 3 doses as needed. For chest pain.    Marland Kitchen OMEPRAZOLE 20 MG PO CPDR Oral Take 20 mg by mouth daily as needed. For indigestion.    Marland Kitchen POTASSIUM CHLORIDE CRYS ER 20 MEQ PO TBCR Oral Take 1 tablet (20 mEq total) by mouth daily. 30 tablet 11  . SITAGLIPTIN PHOSPHATE 100 MG PO TABS Oral Take 100 mg by mouth daily.    Marland Kitchen TIOTROPIUM BROMIDE MONOHYDRATE 18 MCG IN CAPS Inhalation Place 1 capsule (18 mcg total) into inhaler and inhale daily. 30 capsule 5  . TRAMADOL HCL 50 MG PO TABS Oral Take 50 mg by mouth every 6 (six) hours as needed. For pain.      BP 136/79  Pulse 87  Temp 98.1 F (36.7 C) (Oral)  Resp 18  SpO2 98%  Physical Exam  Constitutional: She appears well-developed and well-nourished.  HENT:  Head: Normocephalic.  Eyes: EOM are normal. Pupils are equal, round, and reactive to light.       Nystagmus with gaze to the left.  Neck: Normal range of motion. Neck supple.  Cardiovascular: Normal rate.     Pulmonary/Chest: Effort normal and breath sounds normal.  Abdominal: Soft.  Musculoskeletal: Normal range of motion.  Neurological: She is alert.       Segments with gaze to the left. Finger-nose is intact bilaterally. Patient is able to stand. She states she is unable to move and walk because everything is spinning. Grip strength equal bilaterally.  Skin: Skin is warm. No erythema.  left  tm normal. Right TM with some calcification.   ED Course  Procedures (including critical care time)  Labs Reviewed  COMPREHENSIVE METABOLIC PANEL - Abnormal; Notable for the following:    Alkaline Phosphatase 132 (*)     Total Bilirubin 0.2 (*)     GFR calc non Af Amer 81 (*)     All other components within normal limits  CBC WITH DIFFERENTIAL - Abnormal; Notable for the following:    Hemoglobin 10.1 (*)     HCT 31.8 (*)     MCV 74.5 (*)     MCH 23.7 (*)     RDW 17.5 (*)     All other components within normal limits  URINALYSIS, ROUTINE W REFLEX MICROSCOPIC   Dg Chest 2 View  10/05/2011  *RADIOLOGY REPORT*  Clinical Data: Vertigo  CHEST - 2 VIEW  Comparison: 08/17/2011  Findings: Lungs are essentially clear. No pleural effusion or pneumothorax.  Stable cardiomegaly. Postsurgical changes related to prior CABG.  Mild degenerative changes of the visualized thoracolumbar spine. Right shoulder arthroplasty.  IMPRESSION: No evidence of acute cardiopulmonary disease.  Stable cardiomegaly.   Original Report Authenticated By: Charline Bills, M.D.    Ct Head Wo Contrast  10/05/2011  *RADIOLOGY REPORT*  Clinical Data: Dizziness, blurred vision  CT HEAD WITHOUT CONTRAST  Technique:  Contiguous axial images were obtained from the base of the skull through the vertex without contrast.  Comparison: MRI brain dated 05/28/2004  Findings: No evidence of parenchymal hemorrhage or extra-axial fluid collection. No mass lesion, mass effect, or midline shift.  No CT evidence of acute infarction.  Cerebral volume is age  appropriate.  No ventriculomegaly.  Empty sella, unchanged.  The visualized paranasal sinuses are essentially clear. The mastoid air cells are unopacified.  Postsurgical changes involving the anterior wall of the left maxillary sinus.  No evidence of calvarial fracture.  IMPRESSION: No evidence of acute intracranial abnormality.   Original Report Authenticated By: Charline Bills, M.D.      1. Vertigo      Date: 10/05/2011  Rate: 69  Rhythm: normal sinus rhythm  QRS Axis: normal  Intervals: normal  ST/T Wave abnormalities: normal  Conduction Disutrbances:right bundle branch block  Narrative Interpretation:   Old EKG Reviewed: unchanged    MDM  Patient presents with acute onset vertigo. She states she was watching TV. She does have nystagmus with gaze to the left. Finger-nose intact bilaterally. Patient is able to stand but unable to ambulate. Patient feels somewhat better after treatment, however she is still having symptoms. She is previous cardiac disease, but no previous stroke. I feel that she needs she ruled out for a central cause of vertigo. patient be admitted to medicine.        Juliet Rude. Rubin Payor, MD 10/05/11 2252

## 2011-10-06 ENCOUNTER — Inpatient Hospital Stay (HOSPITAL_COMMUNITY): Admission: RE | Admit: 2011-10-06 | Payer: Medicare Other | Source: Ambulatory Visit

## 2011-10-06 ENCOUNTER — Inpatient Hospital Stay (HOSPITAL_COMMUNITY): Payer: Medicare Other

## 2011-10-06 ENCOUNTER — Encounter (HOSPITAL_COMMUNITY): Payer: Self-pay

## 2011-10-06 DIAGNOSIS — G459 Transient cerebral ischemic attack, unspecified: Secondary | ICD-10-CM

## 2011-10-06 LAB — LIPID PANEL
HDL: 37 mg/dL — ABNORMAL LOW (ref >39)
LDL Cholesterol: 102 mg/dL — ABNORMAL HIGH (ref 0–99)
Triglycerides: 109 mg/dL (ref <150)
VLDL: 22 mg/dL (ref 0–40)

## 2011-10-06 LAB — BASIC METABOLIC PANEL
BUN: 20 mg/dL (ref 6–23)
Creatinine, Ser: 0.73 mg/dL (ref 0.50–1.10)
GFR calc Af Amer: >90 mL/min (ref >90)
GFR calc non Af Amer: 83 mL/min — ABNORMAL LOW (ref >90)

## 2011-10-06 LAB — CREATININE, SERUM
Creatinine, Ser: 0.72 mg/dL (ref 0.50–1.10)
GFR calc Af Amer: >90 mL/min (ref >90)
GFR calc non Af Amer: 83 mL/min — ABNORMAL LOW (ref >90)

## 2011-10-06 LAB — CBC
Hemoglobin: 9.8 g/dL — ABNORMAL LOW (ref 12.0–15.0)
MCHC: 31 g/dL (ref 30.0–36.0)
RDW: 17.6 % — ABNORMAL HIGH (ref 11.5–15.5)
WBC: 7.7 10*3/uL (ref 4.0–10.5)

## 2011-10-06 LAB — GLUCOSE, CAPILLARY

## 2011-10-06 MED ORDER — DOXAZOSIN MESYLATE 8 MG PO TABS: 4.0000 mg | ORAL_TABLET | Freq: Every day | ORAL | Status: DC

## 2011-10-06 MED ORDER — FLUTICASONE-SALMETEROL 250-50 MCG/DOSE IN AEPB
1.0000 | INHALATION_SPRAY | Freq: Two times a day (BID) | RESPIRATORY_TRACT | Status: DC
  Administered 2011-10-06: 1 via RESPIRATORY_TRACT
  Filled 2011-10-06: qty 14

## 2011-10-06 MED ORDER — SODIUM CHLORIDE 0.9 % IV SOLN
INTRAVENOUS | Status: DC
  Administered 2011-10-06: 02:00:00 via INTRAVENOUS

## 2011-10-06 MED ORDER — LORAZEPAM 2 MG/ML IJ SOLN
1.0000 mg | Freq: Once | INTRAMUSCULAR | Status: AC
  Administered 2011-10-06: 1 mg via INTRAVENOUS

## 2011-10-06 MED ORDER — LINAGLIPTIN 5 MG PO TABS
5.0000 mg | ORAL_TABLET | Freq: Every day | ORAL | Status: DC
  Administered 2011-10-06: 5 mg via ORAL
  Filled 2011-10-06: qty 1

## 2011-10-06 MED ORDER — LOSARTAN POTASSIUM 100 MG PO TABS: 50.0000 mg | ORAL_TABLET | Freq: Every day | ORAL | Status: DC

## 2011-10-06 MED ORDER — ADULT MULTIVITAMIN W/MINERALS CH
1.0000 | ORAL_TABLET | Freq: Every day | ORAL | Status: DC
  Administered 2011-10-06: 1 via ORAL
  Filled 2011-10-06: qty 1

## 2011-10-06 MED ORDER — FAMOTIDINE 20 MG PO TABS
20.0000 mg | ORAL_TABLET | Freq: Two times a day (BID) | ORAL | Status: DC
  Administered 2011-10-06: 20 mg via ORAL
  Filled 2011-10-06 (×2): qty 1

## 2011-10-06 MED ORDER — ALBUTEROL SULFATE (5 MG/ML) 0.5% IN NEBU: 2.5000 mg | INHALATION_SOLUTION | RESPIRATORY_TRACT | Status: DC | PRN

## 2011-10-06 MED ORDER — ENOXAPARIN SODIUM 30 MG/0.3ML ~~LOC~~ SOLN
30.0000 mg | Freq: Two times a day (BID) | SUBCUTANEOUS | Status: DC
  Filled 2011-10-06: qty 0.3

## 2011-10-06 MED ORDER — LEVALBUTEROL TARTRATE 45 MCG/ACT IN AERO: 1.0000 | INHALATION_SPRAY | RESPIRATORY_TRACT | Status: DC | PRN

## 2011-10-06 MED ORDER — MECLIZINE HCL 32 MG PO TABS: 32.0000 mg | ORAL_TABLET | Freq: Three times a day (TID) | ORAL | Status: AC | PRN

## 2011-10-06 MED ORDER — INSULIN LISPRO PROT & LISPRO (75-25 MIX) 100 UNIT/ML ~~LOC~~ SUSP: 20.0000 [IU] | Freq: Two times a day (BID) | SUBCUTANEOUS | Status: DC

## 2011-10-06 MED ORDER — NITROGLYCERIN 0.4 MG SL SUBL: 0.4000 mg | SUBLINGUAL_TABLET | SUBLINGUAL | Status: DC | PRN

## 2011-10-06 MED ORDER — ONDANSETRON HCL 4 MG/2ML IJ SOLN
4.0000 mg | Freq: Four times a day (QID) | INTRAMUSCULAR | Status: DC | PRN
  Administered 2011-10-06: 4 mg via INTRAVENOUS
  Filled 2011-10-06: qty 2

## 2011-10-06 MED ORDER — INSULIN LISPRO PROT & LISPRO (75-25 MIX) 100 UNIT/ML ~~LOC~~ SUSP
45.0000 [IU] | Freq: Every day | SUBCUTANEOUS | Status: DC
  Administered 2011-10-06: 45 [IU] via SUBCUTANEOUS
  Filled 2011-10-06 (×8): qty 0.45

## 2011-10-06 MED ORDER — POTASSIUM CHLORIDE CRYS ER 20 MEQ PO TBCR
20.0000 meq | EXTENDED_RELEASE_TABLET | Freq: Every day | ORAL | Status: DC
  Administered 2011-10-06: 20 meq via ORAL
  Filled 2011-10-06: qty 1

## 2011-10-06 MED ORDER — ALBUTEROL SULFATE HFA 108 (90 BASE) MCG/ACT IN AERS
2.0000 | INHALATION_SPRAY | Freq: Four times a day (QID) | RESPIRATORY_TRACT | Status: DC | PRN
Start: 2011-10-06 — End: 2011-10-06
  Filled 2011-10-06: qty 6.7

## 2011-10-06 MED ORDER — ALPRAZOLAM 0.25 MG PO TABS
0.2500 mg | ORAL_TABLET | Freq: Every day | ORAL | Status: DC
  Administered 2011-10-06: 0.25 mg via ORAL
  Filled 2011-10-06: qty 1

## 2011-10-06 MED ORDER — ATORVASTATIN CALCIUM 40 MG PO TABS
40.0000 mg | ORAL_TABLET | ORAL | Status: DC
  Administered 2011-10-06: 40 mg via ORAL
  Filled 2011-10-06: qty 1

## 2011-10-06 MED ORDER — PANTOPRAZOLE SODIUM 40 MG PO TBEC
40.0000 mg | DELAYED_RELEASE_TABLET | Freq: Every day | ORAL | Status: DC
  Administered 2011-10-06: 40 mg via ORAL
  Filled 2011-10-06: qty 1

## 2011-10-06 MED ORDER — VITAMIN D3 25 MCG (1000 UNIT) PO TABS
2000.0000 [IU] | ORAL_TABLET | Freq: Every day | ORAL | Status: DC
  Administered 2011-10-06: 2000 [IU] via ORAL
  Filled 2011-10-06: qty 2

## 2011-10-06 MED ORDER — TRAMADOL HCL 50 MG PO TABS
50.0000 mg | ORAL_TABLET | Freq: Four times a day (QID) | ORAL | Status: DC | PRN
  Administered 2011-10-06: 50 mg via ORAL
  Filled 2011-10-06: qty 1

## 2011-10-06 MED ORDER — ACETAMINOPHEN 325 MG PO TABS: 650.0000 mg | ORAL_TABLET | ORAL | Status: DC | PRN

## 2011-10-06 MED ORDER — CLOPIDOGREL BISULFATE 75 MG PO TABS: 75.0000 mg | ORAL_TABLET | Freq: Every day | ORAL | Status: DC

## 2011-10-06 MED ORDER — NEBIVOLOL HCL 5 MG PO TABS
5.0000 mg | ORAL_TABLET | Freq: Every day | ORAL | Status: DC
  Administered 2011-10-06: 5 mg via ORAL
  Filled 2011-10-06: qty 1

## 2011-10-06 MED ORDER — LORAZEPAM 2 MG/ML IJ SOLN
INTRAMUSCULAR | Status: AC
  Administered 2011-10-06: 1 mg via INTRAVENOUS
  Filled 2011-10-06: qty 1

## 2011-10-06 MED ORDER — INSULIN ASPART 100 UNIT/ML ~~LOC~~ SOLN
0.0000 [IU] | Freq: Three times a day (TID) | SUBCUTANEOUS | Status: DC
  Administered 2011-10-06: 2 [IU] via SUBCUTANEOUS
  Administered 2011-10-06 (×2): 1 [IU] via SUBCUTANEOUS

## 2011-10-06 MED ORDER — CLOPIDOGREL BISULFATE 75 MG PO TABS
75.0000 mg | ORAL_TABLET | Freq: Every day | ORAL | Status: DC
  Filled 2011-10-06: qty 1

## 2011-10-06 MED ORDER — FUROSEMIDE 40 MG PO TABS
40.0000 mg | ORAL_TABLET | Freq: Every day | ORAL | Status: DC
  Administered 2011-10-06: 40 mg via ORAL
  Filled 2011-10-06: qty 1

## 2011-10-06 MED ORDER — MECLIZINE HCL 12.5 MG PO TABS
12.5000 mg | ORAL_TABLET | Freq: Three times a day (TID) | ORAL | Status: DC | PRN
  Filled 2011-10-06: qty 1

## 2011-10-06 MED ORDER — TIOTROPIUM BROMIDE MONOHYDRATE 18 MCG IN CAPS
18.0000 ug | ORAL_CAPSULE | Freq: Every day | RESPIRATORY_TRACT | Status: DC
  Administered 2011-10-06: 18 ug via RESPIRATORY_TRACT
  Filled 2011-10-06: qty 5

## 2011-10-06 MED ORDER — METHOCARBAMOL 500 MG PO TABS
500.0000 mg | ORAL_TABLET | Freq: Two times a day (BID) | ORAL | Status: DC
  Administered 2011-10-06: 500 mg via ORAL
  Filled 2011-10-06 (×2): qty 1

## 2011-10-06 MED ORDER — NON FORMULARY: 100.0000 mg | Freq: Every day | Status: DC

## 2011-10-06 MED ORDER — ASPIRIN 325 MG PO TABS
325.0000 mg | ORAL_TABLET | Freq: Every day | ORAL | Status: DC
  Administered 2011-10-06: 325 mg via ORAL
  Filled 2011-10-06: qty 1

## 2011-10-06 MED ORDER — ENOXAPARIN SODIUM 40 MG/0.4ML ~~LOC~~ SOLN
40.0000 mg | SUBCUTANEOUS | Status: DC
  Administered 2011-10-06: 40 mg via SUBCUTANEOUS
  Filled 2011-10-06: qty 0.4

## 2011-10-06 MED ORDER — INSULIN LISPRO PROT & LISPRO (75-25 MIX) 100 UNIT/ML ~~LOC~~ SUSP
20.0000 [IU] | Freq: Every day | SUBCUTANEOUS | Status: DC
  Administered 2011-10-06: 20 [IU] via SUBCUTANEOUS
  Filled 2011-10-06 (×8): qty 0.2

## 2011-10-06 NOTE — Discharge Summary (Addendum)
Physician Discharge Summary  [name] MRN: [id number] DOB/AGE: Jul 08, 1940 71 y.o.  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 For shortness of breath.      albuterol 108 (90 BASE) MCG/ACT inhaler   Commonly known as: PROVENTIL HFA;VENTOLIN HFA   Inhale 2 puffs into the lungs every 6 (six) hours as needed. For shortness of breath.      ALPRAZolam 0.25 MG tablet   Commonly known as: XANAX   Take 0.25 mg by mouth at bedtime.      aspirin 325 MG tablet   Take 325 mg by mouth daily.      atorvastatin 40 MG tablet   Commonly known as: LIPITOR   Take 40 mg by mouth every other day.      BYSTOLIC 10 MG tablet   Generic drug: nebivolol   Take 5 mg by mouth daily.      clopidogrel 75 MG tablet   Commonly known as: PLAVIX   Take 1 tablet (75 mg total) by mouth daily.   Start taking on: 10/18/2011      cyanocobalamin 1000 MCG/ML injection   Commonly known as: (VITAMIN B-12)   Inject 1,000 mcg into the muscle every 30 (thirty) days.      doxazosin 8 MG tablet   Commonly known as: CARDURA   Take 1 tablet (8 mg total) by mouth at bedtime.      famotidine 20 MG tablet   Commonly known as: PEPCID   Take 20 mg by mouth daily as needed. For indigestion.      Fluticasone-Salmeterol 250-50 MCG/DOSE Aepb   Commonly known as: ADVAIR   Inhale 1 puff into the lungs 2 (two) times daily.      furosemide 40 MG tablet   Commonly known as: LASIX   Take 1 tablet (40 mg total) by mouth daily.      insulin lispro protamine-insulin lispro (75-25) 100 UNIT/ML Susp   Commonly known as: HUMALOG 75/25   Inject 20-45 Units into the skin 2 (two) times daily with a meal. 20 in am, 45 at dinner      levalbuterol 45 MCG/ACT inhaler   Commonly known as: XOPENEX HFA   Inhale 1-2 puffs into the lungs every 4 (four) hours as needed. For shortness of breath.      losartan 100 MG tablet   Commonly known as: COZAAR   TAKE 1 TABLET BY MOUTH DAILY      methocarbamol 500 MG tablet   Commonly known as: ROBAXIN   Take 1 tablet by mouth 2 (two) times daily.      multivitamin with minerals Tabs   Take 1 tablet by mouth daily.      MYLANTA PO   Take 1 tablet by mouth daily as needed. For indigestion.      nitroGLYCERIN 0.4 MG SL tablet   Commonly known as: NITROSTAT   Place 0.4 mg under the tongue every 5 (five) minutes x 3 doses as needed. For chest pain.      omeprazole 20 MG capsule  Commonly known as: PRILOSEC   Take 20 mg by mouth daily as needed. For indigestion.      potassium chloride SA 20 MEQ tablet   Commonly known as: K-DUR,KLOR-CON   Take 1 tablet (20 mEq total) by mouth daily.      sitaGLIPtin 100 MG tablet   Commonly known as: JANUVIA   Take 100 mg by mouth daily.      tiotropium 18 MCG inhalation capsule   Commonly known as: SPIRIVA   Place 1 capsule (18 mcg total) into inhaler and inhale daily.      traMADol 50 MG tablet   Commonly known as: ULTRAM   Take 50 mg by mouth every 6 (six) hours as needed. For pain.      Vitamin D 2000 UNITS tablet   Take 2,000 Units by mouth daily.            Discharge Condition: Stable  Disposition: 01-Home or Self Care   Consults: None   Significant Diagnostic Studies: Dg Chest 2 View  10/05/2011  *RADIOLOGY REPORT*  Clinical Data: Vertigo  CHEST - 2 VIEW  Comparison: 08/17/2011  Findings: Lungs are essentially clear. No pleural effusion or pneumothorax.  Stable cardiomegaly. Postsurgical changes related to prior CABG.  Mild degenerative changes of the visualized thoracolumbar spine. Right shoulder arthroplasty.  IMPRESSION:  No evidence of acute cardiopulmonary disease.  Stable cardiomegaly.   Original Report Authenticated By: Charline Bills, M.D.    Ct Head Wo Contrast  10/05/2011  *RADIOLOGY REPORT*  Clinical Data: Dizziness, blurred vision  CT HEAD WITHOUT CONTRAST  Technique:  Contiguous axial images were obtained from the base of the skull through the vertex without contrast.  Comparison: MRI brain dated 05/28/2004  Findings: No evidence of parenchymal hemorrhage or extra-axial fluid collection. No mass lesion, mass effect, or midline shift.  No CT evidence of acute infarction.  Cerebral volume is age appropriate.  No ventriculomegaly.  Empty sella, unchanged.  The visualized paranasal sinuses are essentially clear. The mastoid air cells are unopacified.  Postsurgical changes involving the anterior wall of the left maxillary sinus.  No evidence of calvarial fracture.  IMPRESSION: No evidence of acute intracranial abnormality.   Original Report Authenticated By: Charline Bills, M.D.      2-D echo  EF: 55% - 60%  ------------------------------------------------------------ Indications: CAD of native vessels 414.01. Dyspnea 786.09.  ------------------------------------------------------------ History: PMH: Acquired from the patient and from the patient's chart. Dyspnea. Coronary artery disease. Asthma. Risk factors: Hypertension. Morbidly obese.  ------------------------------------------------------------ Study Conclusions  - Left ventricle: The cavity size was normal. Wall thickness was normal. Systolic function was normal. The estimated ejection fraction was in the range of 55% to 60%. Wall motion was normal; there were no regional wall motion abnormalities. Doppler parameters are consistent with abnormal left ventricular relaxation (grade 1 diastolic dysfunction).   Microbiology: No results found for this or any previous visit (from the past 240 hour(s)).   Labs: Results for orders placed  during the hospital encounter of 10/05/11 (from the past 48 hour(s))  COMPREHENSIVE METABOLIC PANEL     Status: Abnormal   Collection Time   10/05/11  7:20 PM      Component Value Range Comment   Sodium 141  135 - 145 mEq/L    Potassium 3.8  3.5 - 5.1 mEq/L    Chloride 100  96 - 112 mEq/L    CO2 32  19 - 32 mEq/L    Glucose, Bld 95  70 - 99 mg/dL  BUN 23  6 - 23 mg/dL    Creatinine, Ser 1.61  0.50 - 1.10 mg/dL    Calcium 9.1  8.4 - 09.6 mg/dL    Total Protein 6.9  6.0 - 8.3 g/dL    Albumin 3.6  3.5 - 5.2 g/dL    AST 13  0 - 37 U/L    ALT 11  0 - 35 U/L    Alkaline Phosphatase 132 (*) 39 - 117 U/L    Total Bilirubin 0.2 (*) 0.3 - 1.2 mg/dL    GFR calc non Af Amer 81 (*) >90 mL/min    GFR calc Af Amer >90  >90 mL/min   CBC WITH DIFFERENTIAL     Status: Abnormal   Collection Time   10/05/11  7:20 PM      Component Value Range Comment   WBC 8.0  4.0 - 10.5 K/uL    RBC 4.27  3.87 - 5.11 MIL/uL    Hemoglobin 10.1 (*) 12.0 - 15.0 g/dL    HCT 04.5 (*) 40.9 - 46.0 %    MCV 74.5 (*) 78.0 - 100.0 fL    MCH 23.7 (*) 26.0 - 34.0 pg    MCHC 31.8  30.0 - 36.0 g/dL    RDW 81.1 (*) 91.4 - 15.5 %    Platelets 287  150 - 400 K/uL    Neutrophils Relative 63  43 - 77 %    Neutro Abs 5.1  1.7 - 7.7 K/uL    Lymphocytes Relative 28  12 - 46 %    Lymphs Abs 2.2  0.7 - 4.0 K/uL    Monocytes Relative 6  3 - 12 %    Monocytes Absolute 0.5  0.1 - 1.0 K/uL    Eosinophils Relative 2  0 - 5 %    Eosinophils Absolute 0.2  0.0 - 0.7 K/uL    Basophils Relative 1  0 - 1 %    Basophils Absolute 0.0  0.0 - 0.1 K/uL   URINALYSIS, ROUTINE W REFLEX MICROSCOPIC     Status: Normal   Collection Time   10/05/11  9:05 PM      Component Value Range Comment   Color, Urine YELLOW  YELLOW    APPearance CLEAR  CLEAR    Specific Gravity, Urine 1.020  1.005 - 1.030    pH 5.5  5.0 - 8.0    Glucose, UA NEGATIVE  NEGATIVE mg/dL    Hgb urine dipstick NEGATIVE  NEGATIVE    Bilirubin Urine NEGATIVE  NEGATIVE    Ketones,  ur NEGATIVE  NEGATIVE mg/dL    Protein, ur NEGATIVE  NEGATIVE mg/dL    Urobilinogen, UA 0.2  0.0 - 1.0 mg/dL    Nitrite NEGATIVE  NEGATIVE    Leukocytes, UA NEGATIVE  NEGATIVE MICROSCOPIC NOT DONE ON URINES WITH NEGATIVE PROTEIN, BLOOD, LEUKOCYTES, NITRITE, OR GLUCOSE <1000 mg/dL.  GLUCOSE, CAPILLARY     Status: Abnormal   Collection Time   10/06/11  1:37 AM      Component Value Range Comment   Glucose-Capillary 199 (*) 70 - 99 mg/dL    Comment 1 Documented in Chart      Comment 2 Notify RN     HEMOGLOBIN A1C     Status: Abnormal   Collection Time   10/06/11  4:27 AM      Component Value Range Comment   Hemoglobin A1C 7.7 (*) <5.7 %    Mean Plasma Glucose 174 (*) <117 mg/dL  CBC     Status: Abnormal   Collection Time   10/06/11  4:27 AM      Component Value Range Comment   WBC 7.7  4.0 - 10.5 K/uL    RBC 4.22  3.87 - 5.11 MIL/uL    Hemoglobin 9.8 (*) 12.0 - 15.0 g/dL    HCT 16.1 (*) 09.6 - 46.0 %    MCV 74.9 (*) 78.0 - 100.0 fL    MCH 23.2 (*) 26.0 - 34.0 pg    MCHC 31.0  30.0 - 36.0 g/dL    RDW 04.5 (*) 40.9 - 15.5 %    Platelets 281  150 - 400 K/uL   CREATININE, SERUM     Status: Abnormal   Collection Time   10/06/11  4:27 AM      Component Value Range Comment   Creatinine, Ser 0.72  0.50 - 1.10 mg/dL    GFR calc non Af Amer 83 (*) >90 mL/min    GFR calc Af Amer >90  >90 mL/min   LIPID PANEL     Status: Abnormal   Collection Time   10/06/11  5:00 AM      Component Value Range Comment   Cholesterol 161  0 - 200 mg/dL    Triglycerides 811  <914 mg/dL    HDL 37 (*) >78 mg/dL    Total CHOL/HDL Ratio 4.4      VLDL 22  0 - 40 mg/dL    LDL Cholesterol 295 (*) 0 - 99 mg/dL   BASIC METABOLIC PANEL     Status: Abnormal   Collection Time   10/06/11  5:38 AM      Component Value Range Comment   Sodium 139  135 - 145 mEq/L    Potassium 3.8  3.5 - 5.1 mEq/L    Chloride 101  96 - 112 mEq/L    CO2 29  19 - 32 mEq/L    Glucose, Bld 165 (*) 70 - 99 mg/dL    BUN 20  6 - 23 mg/dL      Creatinine, Ser 6.21  0.50 - 1.10 mg/dL    Calcium 8.9  8.4 - 30.8 mg/dL    GFR calc non Af Amer 83 (*) >90 mL/min    GFR calc Af Amer >90  >90 mL/min   GLUCOSE, CAPILLARY     Status: Abnormal   Collection Time   10/06/11  7:48 AM      Component Value Range Comment   Glucose-Capillary 131 (*) 70 - 99 mg/dL    Comment 1 Documented in Chart      Comment 2 Notify RN        HPI :71 y.o. female with past medical history significant for hypertension and diabetes mellitus,coronary artery disease who presents with above complaints.she states that she was in her usual state of health until yesterday when she began having vertigo-felt like everything in the room was moving and she was having difficulty with balance. She also states that she was on able to focus//blurry vision.she also admits to associated nausea and vomiting. she denies focal weakness, dysphagia, slurred speech and no headaches.she was seen in the ER and a CT scan of her brain was done and came back negative. In the ED she was given Antivert with improvement of her symptoms.She is admitted for further evaluation and management.   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 Versus benign positional vertigo.  The patient is already on aspirin full dose at home. MRI/MRA has been ordered to complete CVA workup. 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 I feel that the patient would benefit from being on Plavix. As vertebrobasilar insufficiency versus brainstem TIA cannot be ruled out. She has an upcoming surgery. Therefore she will continue with the 325 mg of aspirin after surgery she will switch to Plavix, if okay with orthopedics and her primary care provider. She also had improvement with meclizine, therefore prescription for another 3 days is being provided. She would need close outpatient followup with  vascular surgery for her right ICA stenosis    #2 hypertension the blood pressure was on the lower side upon presentation with systolic blood pressure in the 90s. Initially had hypertensive medications were held and she was hydrated with IV fluids. However upon discharge her blood pressures in the 120 systolic the dose of her losartan has been decreased to 50 mg by mouth daily. Dose of Doxil as is and has been reduced to 4 mg daily  #3 diabetes continue with home regimen of insulin and tradjenta   Discharge Exam:  Blood pressure 112/62, pulse 85, temperature 98.8 F (37.1 C), temperature source Oral, resp. rate 18, height 5\' 3"  (1.6 m), weight 102.059 kg (225 lb), SpO2 90.00%.  Head: Normocephalic and atraumatic  Mouth: no erythema or exudates, MMM  Eyes: PERRL, EOMI, conjunctivae normal, No scleral icterus.no nystagmus is noted.  Neck: Supple, Trachea midline normal ROM, No JVD, mass, thyromegaly, or carotid bruit present.  Cardiovascular: RRR, S1 normal, S2 normal, no MRG, pulses symmetric and intact bilaterally  Pulmonary/Chest: CTAB, no wheezes, rales, or rhonchi  Abdominal: Soft. Non-tender, non-distended, bowel sounds are normal, no masses, organomegaly, or guarding present.  Extremities: No cyanosis and no edema. Neurological: A&O x3, Strength is normal and symmetric bilaterally, cranial nerve II-XII are grossly intact, no focal motor deficit, sensory intact to light touch bilaterally.  Skin: Warm, dry and intact. No rash, cyanosis, or clubbing.  Psychiatric: Normal mood and affect. speech and behavior is normal. Judgment and thought content normal. Cognition and memory are normal    Discharge Orders    Future Orders Please Complete By Expires   Diet - low sodium heart healthy      Increase activity slowly           Signed: Richarda Overlie 10/06/2011, 12:31 PM

## 2011-10-06 NOTE — Evaluation (Addendum)
Physical Therapy Evaluation Patient Details Name: [name] MRN: [id number] DOB: Oct 13, 1940 Today's Date: 10/06/2011 Time: 4098-1191 PT Time Calculation (min): 12 min  PT Assessment / Plan / Recommendation Clinical Impression  Pt presents with vertigo and blurred vision.  States that she was talking on phone with family and that room felt like it was moving and that she could not focus.  Pt now states that this has cleared since and tolerated OOB and ambulation in hallway.  Pt with very antalgic gait pattern due to R knee and states that she is scheduled for sx next Tuesday.  Educated pt briefly on how to use RW properly and will benefit from having it at home until sx.   PT recommends no further follow up therapy at home.     PT Assessment  Patient needs continued PT services    Follow Up Recommendations  No PT follow up    Barriers to Discharge Decreased caregiver support      Equipment Recommendations   RW 5" wheels    Recommendations for Other Services     Frequency Min 3X/week    Precautions / Restrictions Precautions Precautions: Fall Restrictions Weight Bearing Restrictions: No   Pertinent Vitals/Pain 10/10 in R knee, assisted pt to w/c to get MRI>       Mobility  Bed Mobility Bed Mobility: Supine to Sit;Sitting - Scoot to Edge of Bed Supine to Sit: 5: Supervision Sitting - Scoot to Edge of Bed: 5: Supervision Details for Bed Mobility Assistance: Supervision for safety.  Pt demos good UE technique getting to EOB.  Transfers Transfers: Sit to Stand;Stand to Sit Sit to Stand: 4: Min guard;With upper extremity assist;From bed Stand to Sit: 4: Min guard;With upper extremity assist;With armrests;To chair/3-in-1 Details for Transfer Assistance: Min/guard for safety with min cues for hand placement.  Ambulation/Gait Ambulation/Gait Assistance: 5: Supervision Ambulation Distance (Feet): 50 Feet Assistive device: Straight cane Ambulation/Gait Assistance Details: Pt  demos very antalgic gait pattern from pain in R knee.  Pt states she is scheduled for knee replacement next Tuesday.  Min cues for safety.  Gait Pattern: Step-through pattern;Decreased stride length;Antalgic Gait velocity: decreased Stairs: No Wheelchair Mobility Wheelchair Mobility: No    Exercises     PT Diagnosis: Difficulty walking;Acute pain;Generalized weakness  PT Problem List: Decreased strength;Decreased range of motion;Decreased mobility;Decreased knowledge of use of DME PT Treatment Interventions: DME instruction;Stair training;Functional mobility training;Gait training;Therapeutic activities;Therapeutic exercise;Balance training;Patient/family education   PT Goals Acute Rehab PT Goals PT Goal Formulation: With patient Time For Goal Achievement: 10/09/11 Potential to Achieve Goals: Good Pt will Ambulate: 51 - 150 feet;with least restrictive assistive device;with modified independence PT Goal: Ambulate - Progress: Goal set today Pt will Go Up / Down Stairs: 1-2 stairs;with min assist;with least restrictive assistive device PT Goal: Up/Down Stairs - Progress: Goal set today (will perform stairs if pt has them)  Visit Information  Last PT Received On: 10/06/11 Assistance Needed: +1 PT/OT Co-Evaluation/Treatment: Yes    Subjective Data  Subjective: I have a bum knee Patient Stated Goal: to go home   Prior Functioning  Home Living Lives With: Alone Home Adaptive Equipment: Straight cane Additional Comments: Pt states she is scheduled for knee sx next Tuesday, will obtain full history at that time.  Pt was scheduled for MRI at time of eval.  Prior Function Level of Independence: Independent with assistive device(s) Communication Communication: No difficulties    Cognition  Overall Cognitive Status: Appears within functional limits for tasks assessed/performed  Arousal/Alertness: Awake/alert Orientation Level: Appears intact for tasks assessed Behavior During Session:  [facility] for tasks performed    Extremity/Trunk Assessment Right Lower Extremity Assessment RLE ROM/Strength/Tone: Deficits RLE ROM/Strength/Tone Deficits: RLE notably weaker than LLE due to knee, ambulates with antalgic gait pattern.  RLE Sensation: WFL - Light Touch RLE Coordination: WFL - gross motor Left Lower Extremity Assessment LLE ROM/Strength/Tone: WFL for tasks assessed LLE Sensation: WFL - Light Touch LLE Coordination: WFL - gross motor Trunk Assessment Trunk Assessment: Normal   Balance    End of Session PT - End of Session Activity Tolerance: Patient limited by pain Patient left: in chair (going to get MRI) Nurse Communication: Mobility status  GP     Page, Meribeth Mattes 10/06/2011, 3:02 PM

## 2011-10-06 NOTE — Progress Notes (Signed)
Occupational Therapy Treatment Patient Details Name: [name] MRN: [id number] DOB: 02-03-1941 Today's Date: 10/06/2011 Time: 4166-0630 OT Time Calculation (min): 14 min  OT Assessment / Plan / Recommendation Comments on Treatment Session      Follow Up Recommendations  No OT follow up    Barriers to Discharge       Equipment Recommendations  None recommended by OT;Rolling walker with 5" wheels    Recommendations for Other Services    Frequency Min 2X/week   Plan      Precautions / Restrictions Precautions Precautions: Fall Restrictions Weight Bearing Restrictions: No   Pertinent Vitals/Pain No pain reported during OT session    ADL  Lower Body Dressing: Performed;Set up (with reacher and sock aid) Where Assessed - Lower Body Dressing: Supported sit to stand Toilet Transfer: Buyer, retail Method:  (bed to w/c in hallway) Transfers/Ambulation Related to ADLs: supervision ambulating in hall with PT ADL Comments: used reacher to doff sock and sock aid to don.  Reports this is much easier for her.  At end of session, pt received call about MRI results and was upset.  Informed NT and RN    OT Diagnosis:  (vertigo)  OT Problem List: Impaired balance (sitting and/or standing);Other (comment) (dizziness) OT Treatment Interventions: Therapeutic exercise;Therapeutic activities;Balance training;Patient/family education   OT Goals Acute Rehab OT Goals OT Goal Formulation: With patient Time For Goal Achievement: 10/13/11 Potential to Achieve Goals: Good Miscellaneous OT Goals Miscellaneous OT Goal #1: pt will gather clothes at a modified I level and complete adls OT Goal: Miscellaneous Goal #1 - Progress: Progressing toward goals Miscellaneous OT Goal #2: Pt will be independent with HEP for dizziness (if needed) OT Goal: Miscellaneous Goal #2 - Progress: Goal set today  Visit Information  Last OT Received On: 10/06/11 Assistance Needed:  +1 PT/OT Co-Evaluation/Treatment: Yes    Subjective Data  Subjective: "I'm much better" Patient Stated Goal: home.  Plans surgery for knee next week   Prior Functioning  Home Living Lives With: Alone Home Adaptive Equipment: Straight cane Additional Comments: Pt states she is scheduled for knee sx next Tuesday, will obtain full history at that time.  Pt was scheduled for MRI at time of eval.  Prior Function Level of Independence: Independent with assistive device(s) Communication Communication: No difficulties    Cognition  Overall Cognitive Status: Appears within functional limits for tasks assessed/performed Arousal/Alertness: Awake/alert Orientation Level: Appears intact for tasks assessed Behavior During Session: Other (comment) (tearful at end of session after call)    Mobility  Shoulder Instructions Bed Mobility Bed Mobility: Supine to Sit;Sitting - Scoot to Edge of Bed Supine to Sit: 5: Supervision Sitting - Scoot to Edge of Bed: 5: Supervision Details for Bed Mobility Assistance: Supervision for safety.  Pt demos good UE technique getting to EOB.  Transfers Sit to Stand: 4: Min guard;With upper extremity assist;From bed Stand to Sit: 4: Min guard;With upper extremity assist;With armrests;To chair/3-in-1 Details for Transfer Assistance: Min/guard for safety with min cues for hand placement.        Exercises      Balance     End of Session OT - End of Session Activity Tolerance: Patient tolerated treatment well  GO     Evadean Sproule 10/06/2011, 4:48 PM Marica Otter, OTR/L [phone] 10/06/2011

## 2011-10-06 NOTE — Progress Notes (Signed)
  Echocardiogram 2D Echocardiogram has been performed.  [name] 10/06/2011, 12:20 PM

## 2011-10-06 NOTE — Evaluation (Signed)
Occupational Therapy Evaluation Patient Details Name: [name] MRN: [id number] DOB: 1940/08/09 Today's Date: 10/06/2011 Time: 4098-1191 OT Time Calculation (min): 12 min  OT Assessment / Plan / Recommendation Clinical Impression  This 71 year old female was admitted for vertigo.  Symptoms have decreased although she reports feeling a  "little funny" when she first sat up.  She plans to have knee surgery next week.  She is overall supervision for moving around and for adls.  Will follow in acute for balance and dizziness (if needed)      OT Assessment  Patient needs continued OT Services    Follow Up Recommendations  No OT follow up (unless dizziness returns)    Barriers to Discharge      Equipment Recommendations  None recommended by OT    Recommendations for Other Services    Frequency  Min 2X/week    Precautions / Restrictions Precautions Precautions: Fall Restrictions Weight Bearing Restrictions: No   Pertinent Vitals/Pain R knee when donning sock:  9/10    ADL  Lower Body Dressing: Performed;Set up (painful.  Showed sock aid prior to leaving for MRI) Where Assessed - Lower Body Dressing: Supported sit to stand Toilet Transfer: Buyer, retail Method:  (bed to w/c in hallway) Transfers/Ambulation Related to ADLs: min guard ambulating in hall with PT ADL Comments: Pt became dizzy when talking to MD on phone at home.  She reports that this is much better.  She did feel a little funny sitting up at EOB--took sitting and standing BPs...no orthostasis.  Pt's eyes moved but no nystagmus noted.  Not symptomatic with rolling in bed.  Pt's VOR was WFLs as was smooth pursuits, saccades, gaze holding, and horizontal headshaking.     OT Diagnosis:  (vertigo); general weakness  OT Problem List: Impaired balance (sitting and/or standing);Other (comment) (dizziness) OT Treatment Interventions: Therapeutic exercise;Therapeutic activities;Balance  training;Patient/family education   OT Goals Acute Rehab OT Goals OT Goal Formulation: With patient Time For Goal Achievement: 10/13/11 Potential to Achieve Goals: Good Miscellaneous OT Goals Miscellaneous OT Goal #1: pt will gather clothes at a modified I level and complete adls, with AE, prn OT Goal: Miscellaneous Goal #1 - Progress: Goal set today Miscellaneous OT Goal #2: Pt will be independent with HEP for dizziness (if needed) OT Goal: Miscellaneous Goal #2 - Progress: Goal set today  Visit Information  Last OT Received On: 10/06/11 Assistance Needed: +1 PT/OT Co-Evaluation/Treatment: Yes    Subjective Data  Subjective: "I'm much better" Patient Stated Goal: home.  Plans surgery for knee next week   Prior Functioning  Vision/Perception  Home Living Lives With: Alone Home Adaptive Equipment: Straight cane Additional Comments: Pt states she is scheduled for knee sx next Tuesday, will obtain full history at that time.  Pt was scheduled for MRI at time of eval.  Prior Function Level of Independence: Independent with assistive device(s) Communication Communication: No difficulties      Cognition  Overall Cognitive Status: Appears within functional limits for tasks assessed/performed Arousal/Alertness: Awake/alert Orientation Level: Appears intact for tasks assessed Behavior During Session: [facility] for tasks performed    Extremity/Trunk Assessment Right Upper Extremity Assessment RUE ROM/Strength/Tone: [facility] for tasks assessed Left Upper Extremity Assessment LUE ROM/Strength/Tone: WFL for tasks assessed Right Lower Extremity Assessment RLE ROM/Strength/Tone: Deficits RLE ROM/Strength/Tone Deficits: RLE notably weaker than LLE due to knee, ambulates with antalgic gait pattern.  RLE Sensation: WFL - Light Touch RLE Coordination: WFL - gross motor Left Lower Extremity Assessment LLE ROM/Strength/Tone:  WFL for tasks assessed LLE Sensation: WFL - Light Touch LLE  Coordination: WFL - gross motor Trunk Assessment Trunk Assessment: Normal   Mobility  Shoulder Instructions  Bed Mobility Bed Mobility: Supine to Sit;Sitting - Scoot to Edge of Bed Supine to Sit: 5: Supervision Sitting - Scoot to Edge of Bed: 5: Supervision Details for Bed Mobility Assistance: Supervision for safety.  Pt demos good UE technique getting to EOB.  Transfers Sit to Stand: 4: Min guard;With upper extremity assist;From bed Stand to Sit: 4: Min guard;With upper extremity assist;With armrests;To chair/3-in-1 Details for Transfer Assistance: Min/guard for safety with min cues for hand placement.        Exercise     Balance     End of Session OT - End of Session Activity Tolerance: Patient tolerated treatment well (short session as MRI transport came)  GO     Ronon Ferger 10/06/2011, 4:00 PM Marica Otter, OTR/L [phone] 10/06/2011

## 2011-10-06 NOTE — Progress Notes (Signed)
*  PRELIMINARY RESULTS* Vascular Ultrasound Carotid Duplex (Doppler) has been completed.  Preliminary findings: Right= 40-59% ICA stenosis with antegrade vertebral flow. Left= no significant ICA stenosis with antegrade vertebral flow.  Farrel Demark, RDMS, RVT  10/06/2011, 10:37 AM

## 2011-10-07 NOTE — Care Management Note (Signed)
    Page 1 of 1   10/07/2011     9:49:09 AM   CARE MANAGEMENT NOTE 10/07/2011  Patient:  [name], [name]   Account Number:  [id number]  Date Initiated:  10/06/2011  Documentation initiated by:  Roseanne Reno  Subjective/Objective Assessment:   Dx:  Vertigo     Action/Plan:   Standard rolling walker   Anticipated DC Date:  10/06/2011   Anticipated DC Plan:  HOME W HOME HEALTH SERVICES         [facility] Choice  DURABLE MEDICAL EQUIPMENT   Choice offered to / List presented to:  C-1 Patient   DME arranged  Levan Hurst      DME agency  [facility]        Status of service:  Completed, signed off  Discharge Disposition:  HOME W HOME HEALTH SERVICES  Comments:  10/07/11  On Call Entry:  10/06/11  525p - Recevied call from pts Nurse Joan Flores, stated that pt needs a rolling walker for discharge.  Called the pt in her room [phone]) and discussed DME and choice given.  No preference, referral made to [facility].  CM called [facility] and spoke w/ Reuel Boom who stated that DME (walkers and BSC) could no longer be delivered to the hospital after hours. [facility] could ship the walker to the pt and she should receive it within 1-2 days or the family/friend could pick the walker up at one of their stores.  Per Ms. Bills, she will have a church member pick the walker up at the [facility] location on 8/29.  Reuel Boom will alert the staff at that location.  CM notified pts Nurse of dc plan.  Questions answered.  CM available to assist as needed.  TJohnson, RNBSN   [phone]

## 2011-10-12 ENCOUNTER — Ambulatory Visit (HOSPITAL_COMMUNITY): Admission: RE | Admit: 2011-10-12 | Payer: Medicare Other | Source: Ambulatory Visit | Admitting: Orthopedic Surgery

## 2011-10-12 ENCOUNTER — Encounter (HOSPITAL_COMMUNITY): Admission: RE | Payer: Self-pay | Source: Ambulatory Visit

## 2011-10-12 SURGERY — ARTHROSCOPY, KNEE, WITH MEDIAL MENISCECTOMY
Anesthesia: General | Site: Knee | Laterality: Right

## 2011-11-01 ENCOUNTER — Encounter (HOSPITAL_COMMUNITY): Payer: Self-pay | Admitting: Pharmacy Technician

## 2011-11-08 ENCOUNTER — Encounter (HOSPITAL_COMMUNITY): Payer: Self-pay

## 2011-11-08 ENCOUNTER — Encounter (HOSPITAL_COMMUNITY)
Admission: RE | Admit: 2011-11-08 | Discharge: 2011-11-08 | Disposition: A | Discharge status: Home / Self Care | Payer: Medicare Other | Source: Ambulatory Visit | Attending: Orthopedic Surgery | Admitting: Orthopedic Surgery

## 2011-11-08 HISTORY — DX: Gastro-esophageal reflux disease without esophagitis: K21.9

## 2011-11-08 HISTORY — DX: Anxiety disorder, unspecified: F41.9

## 2011-11-08 HISTORY — DX: Other specified postprocedural states: Z98.890

## 2011-11-08 HISTORY — DX: Nausea with vomiting, unspecified: R11.2

## 2011-11-08 LAB — BASIC METABOLIC PANEL
CO2: 32 mEq/L (ref 19–32)
Calcium: 8.9 mg/dL (ref 8.4–10.5)
Chloride: 103 mEq/L (ref 96–112)
GFR calc Af Amer: 72 mL/min — ABNORMAL LOW (ref >90)
Sodium: 143 mEq/L (ref 135–145)

## 2011-11-08 LAB — SURGICAL PCR SCREEN: MRSA, PCR: NEGATIVE

## 2011-11-08 LAB — CBC
Platelets: 315 10*3/uL (ref 150–400)
RBC: 4.53 MIL/uL (ref 3.87–5.11)
RDW: 17 % — ABNORMAL HIGH (ref 11.5–15.5)
WBC: 7.2 10*3/uL (ref 4.0–10.5)

## 2011-11-08 NOTE — Patient Instructions (Signed)
20      Your procedure is scheduled on:  Wednesday 11/10/2011 AT 0830 AM  Report to [facility] at 0600  AM.  Call this number if you have problems the morning of surgery: [phone]   Remember:TAKE 1/2 DOSE OF HUMALOG INSULIN NIGHT BEFORE SURGERY -WHICH IS 22.5 UNITS    Do not eat food or drink liquids after midnight!  Take these medicines the morning of surgery with A SIP OF WATER: Bystolic, Spiriva, Use Advair diskus if needed, Albuterol if needed, and Pepcid or Prilosec if needed   Do not bring valuables to the hospital.  .  Leave suitcase in the car. After surgery it may be brought to your room.  For patients admitted to the hospital, checkout time is 11:00 AM the day of              Discharge.    Special Instructions: See [facility] Preparing  For Surgery Instruction Sheet.  Do not wear jewelry, lotions powders, perfumes. Women do not shave legs or underarms for 12 hours before showers. Contacts, partial plates, or dentures may not be worn into surgery.                          Patients discharged the day of surgery will not be allowed to drive home. If going home the same day of surgery, must have someone stay with you  first 24 hrs.at home and arrange for someone to drive you home from the              Hospital.   Please read over the following fact sheets that you were given: MRSA              INFORMATION, Sleep apnea sheet, Incentive spirometry sheet               Telford Nab.Shalinda Burkholder,RN,BSN [phone]

## 2011-11-10 ENCOUNTER — Encounter (HOSPITAL_COMMUNITY)
Admission: RE | Disposition: A | Discharge status: Home / Self Care | Payer: Self-pay | Source: Ambulatory Visit | Attending: Orthopedic Surgery

## 2011-11-10 ENCOUNTER — Encounter (HOSPITAL_COMMUNITY): Payer: Self-pay | Admitting: Anesthesiology

## 2011-11-10 ENCOUNTER — Ambulatory Visit (HOSPITAL_COMMUNITY): Payer: Medicare Other | Admitting: Anesthesiology

## 2011-11-10 ENCOUNTER — Encounter (HOSPITAL_COMMUNITY): Payer: Self-pay | Admitting: *Deleted

## 2011-11-10 ENCOUNTER — Ambulatory Visit (HOSPITAL_COMMUNITY)
Admission: RE | Admit: 2011-11-10 | Discharge: 2011-11-10 | Disposition: A | Discharge status: Home / Self Care | Payer: Medicare Other | Source: Ambulatory Visit | Attending: Orthopedic Surgery | Admitting: Orthopedic Surgery

## 2011-11-10 DIAGNOSIS — Z79899 Other long term (current) drug therapy: Secondary | ICD-10-CM | POA: Insufficient documentation

## 2011-11-10 DIAGNOSIS — E785 Hyperlipidemia, unspecified: Secondary | ICD-10-CM | POA: Insufficient documentation

## 2011-11-10 DIAGNOSIS — Z951 Presence of aortocoronary bypass graft: Secondary | ICD-10-CM | POA: Insufficient documentation

## 2011-11-10 DIAGNOSIS — I2510 Atherosclerotic heart disease of native coronary artery without angina pectoris: Secondary | ICD-10-CM | POA: Insufficient documentation

## 2011-11-10 DIAGNOSIS — M23329 Other meniscus derangements, posterior horn of medial meniscus, unspecified knee: Secondary | ICD-10-CM | POA: Diagnosis present

## 2011-11-10 DIAGNOSIS — M23349 Other meniscus derangements, anterior horn of lateral meniscus, unspecified knee: Secondary | ICD-10-CM | POA: Diagnosis present

## 2011-11-10 DIAGNOSIS — Z794 Long term (current) use of insulin: Secondary | ICD-10-CM | POA: Insufficient documentation

## 2011-11-10 DIAGNOSIS — I10 Essential (primary) hypertension: Secondary | ICD-10-CM | POA: Insufficient documentation

## 2011-11-10 DIAGNOSIS — IMO0002 Reserved for concepts with insufficient information to code with codable children: Secondary | ICD-10-CM | POA: Insufficient documentation

## 2011-11-10 DIAGNOSIS — Z01812 Encounter for preprocedural laboratory examination: Secondary | ICD-10-CM | POA: Insufficient documentation

## 2011-11-10 DIAGNOSIS — M1711 Unilateral primary osteoarthritis, right knee: Secondary | ICD-10-CM | POA: Diagnosis present

## 2011-11-10 DIAGNOSIS — X58XXXA Exposure to other specified factors, initial encounter: Secondary | ICD-10-CM | POA: Insufficient documentation

## 2011-11-10 DIAGNOSIS — M659 Unspecified synovitis and tenosynovitis, unspecified site: Secondary | ICD-10-CM | POA: Insufficient documentation

## 2011-11-10 DIAGNOSIS — E119 Type 2 diabetes mellitus without complications: Secondary | ICD-10-CM | POA: Insufficient documentation

## 2011-11-10 DIAGNOSIS — K219 Gastro-esophageal reflux disease without esophagitis: Secondary | ICD-10-CM | POA: Insufficient documentation

## 2011-11-10 DIAGNOSIS — M2240 Chondromalacia patellae, unspecified knee: Secondary | ICD-10-CM | POA: Insufficient documentation

## 2011-11-10 DIAGNOSIS — S83289A Other tear of lateral meniscus, current injury, unspecified knee, initial encounter: Secondary | ICD-10-CM | POA: Insufficient documentation

## 2011-11-10 DIAGNOSIS — M1710 Unilateral primary osteoarthritis, unspecified knee: Secondary | ICD-10-CM | POA: Insufficient documentation

## 2011-11-10 LAB — GLUCOSE, CAPILLARY: Glucose-Capillary: 153 mg/dL — ABNORMAL HIGH (ref 70–99)

## 2011-11-10 SURGERY — ARTHROSCOPY, KNEE, WITH MEDIAL MENISCECTOMY
Anesthesia: General | Site: Knee | Laterality: Right | Wound class: Clean

## 2011-11-10 MED ORDER — DEXAMETHASONE SODIUM PHOSPHATE 10 MG/ML IJ SOLN
INTRAMUSCULAR | Status: DC | PRN
  Administered 2011-11-10: 10 mg via INTRAVENOUS

## 2011-11-10 MED ORDER — CEFAZOLIN SODIUM-DEXTROSE 2-3 GM-% IV SOLR
2.0000 g | INTRAVENOUS | Status: AC
  Administered 2011-11-10: 2 g via INTRAVENOUS

## 2011-11-10 MED ORDER — BUPIVACAINE-EPINEPHRINE PF 0.25-1:200000 % IJ SOLN
INTRAMUSCULAR | Status: AC
  Filled 2011-11-10: qty 30

## 2011-11-10 MED ORDER — EPHEDRINE SULFATE 50 MG/ML IJ SOLN
INTRAMUSCULAR | Status: DC | PRN
  Administered 2011-11-10: 10 mg via INTRAVENOUS
  Administered 2011-11-10: 5 mg via INTRAVENOUS

## 2011-11-10 MED ORDER — PROMETHAZINE HCL 25 MG/ML IJ SOLN: 6.2500 mg | INTRAMUSCULAR | Status: DC | PRN

## 2011-11-10 MED ORDER — PROPOFOL 10 MG/ML IV EMUL
INTRAVENOUS | Status: DC | PRN
  Administered 2011-11-10: 150 mg via INTRAVENOUS

## 2011-11-10 MED ORDER — LIDOCAINE HCL (CARDIAC) 20 MG/ML IV SOLN
INTRAVENOUS | Status: DC | PRN
  Administered 2011-11-10: 50 mg via INTRAVENOUS

## 2011-11-10 MED ORDER — NEBIVOLOL HCL 5 MG PO TABS
5.0000 mg | ORAL_TABLET | Freq: Once | ORAL | Status: AC
  Administered 2011-11-10: 5 mg via ORAL
  Filled 2011-11-10: qty 1

## 2011-11-10 MED ORDER — PHENYLEPHRINE HCL 10 MG/ML IJ SOLN
INTRAMUSCULAR | Status: DC | PRN
  Administered 2011-11-10 (×3): 40 ug via INTRAVENOUS
  Administered 2011-11-10: 20 ug via INTRAVENOUS

## 2011-11-10 MED ORDER — MIDAZOLAM HCL 5 MG/5ML IJ SOLN
INTRAMUSCULAR | Status: DC | PRN
  Administered 2011-11-10: 1 mg via INTRAVENOUS

## 2011-11-10 MED ORDER — BACITRACIN ZINC 500 UNIT/GM EX OINT
TOPICAL_OINTMENT | CUTANEOUS | Status: DC | PRN
  Administered 2011-11-10: 1 via TOPICAL

## 2011-11-10 MED ORDER — FENTANYL CITRATE 0.05 MG/ML IJ SOLN
25.0000 ug | INTRAMUSCULAR | Status: DC | PRN
  Administered 2011-11-10 (×3): 50 ug via INTRAVENOUS

## 2011-11-10 MED ORDER — LACTATED RINGERS IR SOLN
Status: DC | PRN
  Administered 2011-11-10 (×2): 3000 mL

## 2011-11-10 MED ORDER — FENTANYL CITRATE 0.05 MG/ML IJ SOLN
INTRAMUSCULAR | Status: AC
  Filled 2011-11-10: qty 2

## 2011-11-10 MED ORDER — KETOROLAC TROMETHAMINE 30 MG/ML IJ SOLN: 15.0000 mg | Freq: Once | INTRAMUSCULAR | Status: DC | PRN

## 2011-11-10 MED ORDER — ONDANSETRON HCL 4 MG/2ML IJ SOLN
INTRAMUSCULAR | Status: DC | PRN
  Administered 2011-11-10 (×2): 2 mg via INTRAVENOUS

## 2011-11-10 MED ORDER — ACETAMINOPHEN 10 MG/ML IV SOLN
INTRAVENOUS | Status: AC
  Filled 2011-11-10: qty 100

## 2011-11-10 MED ORDER — BUPIVACAINE-EPINEPHRINE 0.25% -1:200000 IJ SOLN
INTRAMUSCULAR | Status: DC | PRN
  Administered 2011-11-10: 30 mL

## 2011-11-10 MED ORDER — LACTATED RINGERS IV SOLN
INTRAVENOUS | Status: DC
  Administered 2011-11-10: 08:00:00 via INTRAVENOUS

## 2011-11-10 MED ORDER — BACITRACIN ZINC 500 UNIT/GM EX OINT
TOPICAL_OINTMENT | CUTANEOUS | Status: AC
  Filled 2011-11-10: qty 15

## 2011-11-10 MED ORDER — HYDROMORPHONE HCL 2 MG PO TABS
2.0000 mg | ORAL_TABLET | ORAL | Status: DC | PRN
  Administered 2011-11-10: 2 mg via ORAL
  Filled 2011-11-10: qty 1

## 2011-11-10 MED ORDER — FENTANYL CITRATE 0.05 MG/ML IJ SOLN
INTRAMUSCULAR | Status: DC | PRN
  Administered 2011-11-10 (×2): 50 ug via INTRAVENOUS
  Administered 2011-11-10: 75 ug via INTRAVENOUS
  Administered 2011-11-10: 25 ug via INTRAVENOUS
  Administered 2011-11-10: 50 ug via INTRAVENOUS

## 2011-11-10 MED ORDER — KETOROLAC TROMETHAMINE 15 MG/ML IJ SOLN
INTRAMUSCULAR | Status: AC
  Administered 2011-11-10: 15 mg
  Filled 2011-11-10: qty 1

## 2011-11-10 MED ORDER — CEFAZOLIN SODIUM-DEXTROSE 2-3 GM-% IV SOLR
INTRAVENOUS | Status: AC
  Filled 2011-11-10: qty 50

## 2011-11-10 SURGICAL SUPPLY — 31 items
BANDAGE ELASTIC 4 VELCRO ST LF (GAUZE/BANDAGES/DRESSINGS) ×2 IMPLANT
BANDAGE ELASTIC 6 VELCRO ST LF (GAUZE/BANDAGES/DRESSINGS) ×1 IMPLANT
BANDAGE GAUZE ELAST BULKY 4 IN (GAUZE/BANDAGES/DRESSINGS) ×1 IMPLANT
BLADE GREAT WHITE 4.2 (BLADE) ×2 IMPLANT
BNDG COHESIVE 6X5 TAN STRL LF (GAUZE/BANDAGES/DRESSINGS) ×2 IMPLANT
CLOTH BEACON ORANGE TIMEOUT ST (SAFETY) ×2 IMPLANT
DRAPE LG THREE QUARTER DISP (DRAPES) ×2 IMPLANT
DRSG EMULSION OIL 3X3 NADH (GAUZE/BANDAGES/DRESSINGS) ×1 IMPLANT
DRSG PAD ABDOMINAL 8X10 ST (GAUZE/BANDAGES/DRESSINGS) ×3 IMPLANT
DURAPREP 26ML APPLICATOR (WOUND CARE) ×2 IMPLANT
GLOVE BIOGEL PI IND STRL 8 (GLOVE) ×1 IMPLANT
GLOVE BIOGEL PI IND STRL 8.5 (GLOVE) ×1 IMPLANT
GLOVE BIOGEL PI INDICATOR 8 (GLOVE) ×1
GLOVE BIOGEL PI INDICATOR 8.5 (GLOVE) ×1
GLOVE ECLIPSE 8.0 STRL XLNG CF (GLOVE) ×6 IMPLANT
GOWN PREVENTION PLUS LG XLONG (DISPOSABLE) ×4 IMPLANT
GOWN PREVENTION PLUS XLARGE (GOWN DISPOSABLE) ×2 IMPLANT
GOWN STRL NON-REIN LRG LVL3 (GOWN DISPOSABLE) ×2 IMPLANT
GOWN STRL REIN XL XLG (GOWN DISPOSABLE) ×4 IMPLANT
MANIFOLD NEPTUNE II (INSTRUMENTS) ×2 IMPLANT
PACK ARTHROSCOPY WL (CUSTOM PROCEDURE TRAY) ×2 IMPLANT
PACK ICE MAXI GEL EZY WRAP (MISCELLANEOUS) ×2 IMPLANT
PAD MASON LEG HOLDER (PIN) ×2 IMPLANT
SET ARTHROSCOPY TUBING (MISCELLANEOUS) ×2
SET ARTHROSCOPY TUBING LN (MISCELLANEOUS) ×1 IMPLANT
SPONGE GAUZE 4X4 12PLY (GAUZE/BANDAGES/DRESSINGS) ×1 IMPLANT
SUT ETHILON 3 0 PS 1 (SUTURE) ×2 IMPLANT
TOWEL OR 17X26 10 PK STRL BLUE (TOWEL DISPOSABLE) ×5 IMPLANT
TUBING CONNECTING 10 (TUBING) ×2 IMPLANT
WAND 90 DEG TURBOVAC W/CORD (SURGICAL WAND) ×2 IMPLANT
WRAP KNEE MAXI GEL POST OP (GAUZE/BANDAGES/DRESSINGS) ×3 IMPLANT

## 2011-11-10 NOTE — Anesthesia Preprocedure Evaluation (Addendum)
Anesthesia Evaluation  Patient identified by MRN, date of birth, ID band Patient awake    Reviewed: Allergy & Precautions, H&P , NPO status , Patient's Chart, lab work & pertinent test results  History of Anesthesia Complications (+) PONV  Airway Mallampati: II TM Distance: <3 FB Neck ROM: Full    Dental No notable dental hx.    Pulmonary asthma ,  breath sounds clear to auscultation  + decreased breath sounds      Cardiovascular hypertension, Pt. on medications + CAD and + CABG Rhythm:Regular Rate:Normal     Neuro/Psych TIAnegative psych ROS   GI/Hepatic negative GI ROS, Neg liver ROS,   Endo/Other  diabetes, Insulin DependentMorbid obesity  Renal/GU negative Renal ROS  negative genitourinary   Musculoskeletal negative musculoskeletal ROS (+)   Abdominal   Peds negative pediatric ROS (+)  Hematology negative hematology ROS (+)   Anesthesia Other Findings   Reproductive/Obstetrics negative OB ROS                          Anesthesia Physical Anesthesia Plan  ASA: III  Anesthesia Plan: General   Post-op Pain Management:    Induction: Intravenous  Airway Management Planned: LMA  Additional Equipment:   Intra-op Plan:   Post-operative Plan:   Informed Consent: I have reviewed the patients History and Physical, chart, labs and discussed the procedure including the risks, benefits and alternatives for the proposed anesthesia with the patient or authorized representative who has indicated his/her understanding and acceptance.   Dental advisory given  Plan Discussed with: CRNA and Surgeon  Anesthesia Plan Comments:         Anesthesia Quick Evaluation

## 2011-11-10 NOTE — Anesthesia Postprocedure Evaluation (Signed)
  Anesthesia Post-op Note  Patient: [name]  Procedure(s) Performed: Procedure(s) (LRB): KNEE ARTHROSCOPY WITH MEDIAL MENISECTOMY (Right)  Patient Location: PACU  Anesthesia Type: General  Level of Consciousness: awake and alert   Airway and Oxygen Therapy: Patient Spontanous Breathing  Post-op Pain: mild  Post-op Assessment: Post-op Vital signs reviewed, Patient's Cardiovascular Status Stable, Respiratory Function Stable, Patent Airway and No signs of Nausea or vomiting  Post-op Vital Signs: stable  Complications: No apparent anesthesia complications

## 2011-11-10 NOTE — Preoperative (Signed)
Beta Blockers   Reason not to administer Beta Blockers:Not Applicable 

## 2011-11-10 NOTE — Interval H&P Note (Signed)
History and Physical Interval Note:  11/10/2011 7:57 AM  [name]  has presented today for surgery, with the diagnosis of right knee medial mensical tear  The various methods of treatment have been discussed with the patient and family. After consideration of risks, benefits and other options for treatment, the patient has consented to  Procedure(s) (LRB) with comments: KNEE ARTHROSCOPY WITH MEDIAL MENISECTOMY (Right) as a surgical intervention .  The patient's history has been reviewed, patient examined, no change in status, stable for surgery.  I have reviewed the patient's chart and labs.  Questions were answered to the patient's satisfaction.     Agapito Hanway A

## 2011-11-10 NOTE — H&P (Signed)
[name] is an 71 y.o. female.   Chief Complaint: right knee pain HPI: The patient is a 71 year old female who presented with the chief complaint of right knee pain. MRI revealed right knee medial meniscus tear.   Past Medical History  Diagnosis Date  . Coronary artery disease   . Diabetes mellitus   . Hypertension   . Hyperlipidemia   . Shoulder injury   . Chest pain   . Acute myocardial infarction, unspecified site, episode of care unspecified   . Unspecified asthma   . PONV (postoperative nausea and vomiting)   . Anxiety   . GERD (gastroesophageal reflux disease)     Past Surgical History  Procedure Date  . Cardiac catheterization 07/23/2009    EF 60%  . Cardiac catheterization 10/11/2008  . Cardiac catheterization 03/01/2007    EF 75-80%  . Coronary artery bypass graft     SEVERELY DISEASED SAPHENOUS VEIN GRAFT TO THE RIGHT CORONARY ARTERY BUT WITH FAIRLY WELL PRESERVED FLOW TO THE DISTAL RIGHT CORONARY ARTERY FROM THE NATIVE CIRCULATION-RESTART  CATH IN JUNE 2000, REVEALS MILD/MODERATE  CAD WITH GOOD FLOW DOWN HER LAD  . US echocardiography 03/08/2008    EF 55-60%  . Cardiovascular stress test 11/15/2007    EF 60%  . Rotator cuff repair     right and left  . Tumor removed kidney   . Tonsillectomy     age 52  . Abdominal hysterectomy   . Appendectomy     came out with Hysterectomy  . Eye surgery     bilateral cataract surgery with lens implant    Family History  Problem Relation Age of Onset  . Emphysema Paternal Uncle   . Heart disease Maternal Grandfather   . Esophageal cancer Brother    Social History:  reports that she quit smoking about 29 years ago. Her smoking use included Cigarettes. She has a 12.5 pack-year smoking history. She has never used smokeless tobacco. She reports that she does not drink alcohol or use illicit drugs.  Allergies:  Allergies  Allergen Reactions  . Amlodipine   . Banana Nausea And Vomiting    Stomach pumped  . Codeine    Hallucinate, loose identity and don't know who I am  . Morphine Other (See Comments)    Can not function, it immobilizes me   . Sulfonamide Derivatives Swelling    Medications Prior to Admission  Medication Sig Dispense Refill  . ALPRAZolam (XANAX) 0.25 MG tablet Take 0.25 mg by mouth 2 (two) times daily as needed.       . Calcium & Magnesium Carbonates (MYLANTA PO) Take 1 tablet by mouth daily as needed. For indigestion.      Marland Kitchen doxazosin (CARDURA) 8 MG tablet Take 8 mg by mouth at bedtime.      . enoxaparin (LOVENOX) 40 MG/0.4ML injection Inject 40 mg into the skin daily. Patient will take 3 days before surgery and 2 days after surgery.      . famotidine (PEPCID) 20 MG tablet Take 20 mg by mouth daily as needed. For stomach upset      . furosemide (LASIX) 40 MG tablet Take 40 mg by mouth every morning.      . insulin lispro protamine-insulin lispro (HUMALOG 75/25) (75-25) 100 UNIT/ML SUSP Inject 20-45 Units into the skin 2 (two) times daily with a meal. 20 in am, 45 at dinner      . losartan (COZAAR) 50 MG tablet Take 50 mg by  mouth every morning.      . methocarbamol (ROBAXIN) 500 MG tablet Take 1 tablet by mouth 2 (two) times daily.       . Multiple Vitamin (MULTIVITAMIN WITH MINERALS) TABS Take 1 tablet by mouth daily.      . potassium chloride SA (K-DUR,KLOR-CON) 20 MEQ tablet Take 1 tablet (20 mEq total) by mouth daily.  30 tablet  11  . sitaGLIPtin (JANUVIA) 100 MG tablet Take 100 mg by mouth every morning.       . tiotropium (SPIRIVA HANDIHALER) 18 MCG inhalation capsule Place 1 capsule (18 mcg total) into inhaler and inhale daily.  30 capsule  5  . traMADol (ULTRAM) 50 MG tablet Take 50 mg by mouth every 6 (six) hours as needed. For pain.      Marland Kitchen albuterol (PROVENTIL HFA;VENTOLIN HFA) 108 (90 BASE) MCG/ACT inhaler Inhale 2 puffs into the lungs every 6 (six) hours as needed. For shortness of breath.      Marland Kitchen albuterol (PROVENTIL) (2.5 MG/3ML) 0.083% nebulizer solution Take 2.5 mg by  nebulization every 6 (six) hours as needed. For shortness of breath.      Marland Kitchen atorvastatin (LIPITOR) 40 MG tablet Take 40 mg by mouth every other day. Takes at night      . Cholecalciferol (VITAMIN D) 2000 UNITS tablet Take 2,000 Units by mouth daily.       . clopidogrel (PLAVIX) 75 MG tablet Take 75 mg by mouth every morning.      . Fluticasone-Salmeterol (ADVAIR DISKUS) 250-50 MCG/DOSE AEPB Inhale 1 puff into the lungs 2 (two) times daily.  60 each  6  . nebivolol (BYSTOLIC) 10 MG tablet Take 5 mg by mouth every morning.       . nitroGLYCERIN (NITROSTAT) 0.4 MG SL tablet Place 0.4 mg under the tongue every 5 (five) minutes x 3 doses as needed. For chest pain.      Marland Kitchen omeprazole (PRILOSEC) 20 MG capsule Take 20 mg by mouth daily as needed. For indigestion.        Results for orders placed during the hospital encounter of 11/10/11 (from the past 48 hour(s))  GLUCOSE, CAPILLARY     Status: Abnormal   Collection Time   11/10/11  6:51 AM      Component Value Range Comment   Glucose-Capillary 141 (*) 70 - 99 mg/dL    Review of Systems  Constitutional: Negative.   HENT: Negative.  Negative for neck pain.   Eyes: Negative.   Respiratory: Negative.   Cardiovascular: Negative.   Gastrointestinal: Negative.   Genitourinary: Negative.   Musculoskeletal: Positive for joint pain and falls. Negative for myalgias and back pain.       Right knee pain  Skin: Negative.   Neurological: Negative.   Endo/Heme/Allergies: Negative.   Psychiatric/Behavioral: Negative.     Blood pressure 117/73, pulse 78, temperature 98.5 F (36.9 C), resp. rate 20, SpO2 100.00%. Physical Exam  Constitutional: She is oriented to person, place, and time. She appears well-developed and well-nourished. No distress.  HENT:  Head: Normocephalic and atraumatic.  Right Ear: External ear normal.  Left Ear: External ear normal.  Mouth/Throat: Oropharynx is clear and moist.  Eyes: Conjunctivae normal and EOM are normal.  Neck:  Normal range of motion. Neck supple.  Cardiovascular: Normal rate, regular rhythm, normal heart sounds and intact distal pulses.   No murmur heard. Respiratory: Effort normal and breath sounds normal. No respiratory distress. She has no wheezes. She has no rales. She exhibits  no tenderness.  GI: Soft. Bowel sounds are normal. She exhibits no distension and no mass. There is no tenderness.  Musculoskeletal:       Right hip: Normal.       Left hip: Normal.       Right knee: She exhibits decreased range of motion, swelling and effusion. She exhibits no ecchymosis, no erythema and normal alignment. tenderness found. Medial joint line tenderness noted.       Left knee: Normal.       Right ankle: Normal.       Left ankle: Normal.  Lymphadenopathy:    She has no cervical adenopathy.  Neurological: She is alert and oriented to person, place, and time.  Skin: No rash noted. She is not diaphoretic. No erythema.     Assessment/Plan She has a right knee medial meniscus tear. She needs a right knee arthroscopy with medial menisectomy.   Willet Schleifer LAUREN 11/10/2011, 7:39 AM

## 2011-11-10 NOTE — Brief Op Note (Signed)
11/10/2011  9:50 AM  PATIENT:  [name]  71 y.o. female  PRE-OPERATIVE DIAGNOSIS:  right knee medial mensical tearand Lateral Meniscus Tear and Osteoarthritis POST-OPERATIVE DIAGNOSIS:  Same as Pre-Op PROCEDURE:  Procedure(s) (LRB) with comments: KNEE ARTHROSCOPY WITH MEDIAL MENISECTOMY (Right) -  medial and lateral menisectomy and Abrasion chondoplasty of the patella  SURGEON:  Surgeon(s) and Role:    * Jacki Cones, MD - Primary   ASSISTANTS: OR Tech  ANESTHESIA:   general  EBL:  Total I/O In: 1000 [I.V.:1000] Out: -   BLOOD ADMINISTERED:none  DRAINS: none   LOCAL MEDICATIONS USED:  30cc of 0.25%Marcaine with Epinephrine.  SPECIMEN:  No Specimen  DISPOSITION OF SPECIMEN:  N/A  COUNTS:  YES  TOURNIQUET:  * No tourniquets in log *  DICTATION: .Other Dictation: Dictation Number [id number]  PLAN OF CARE: Discharge to home after PACU  PATIENT DISPOSITION:  PACU - hemodynamically stable.   Delay start of Pharmacological VTE agent (>24hrs) due to surgical blood loss or risk of bleeding: yes

## 2011-11-10 NOTE — Anesthesia Procedure Notes (Addendum)
Date/Time: 11/10/2011 9:32 AM Performed by: Edison Pace   Procedure Name: LMA Insertion Date/Time: 11/10/2011 8:46 AM Performed by: Edison Pace Pre-anesthesia Checklist: Patient identified, Timeout performed, Emergency Drugs available, Patient being monitored and Suction available Patient Re-evaluated:Patient Re-evaluated prior to inductionOxygen Delivery Method: Circle system utilized Preoxygenation: Pre-oxygenation with 100% oxygen Intubation Type: IV induction LMA: LMA with gastric port inserted LMA Size: 4.0 Number of attempts: 1

## 2011-11-10 NOTE — Transfer of Care (Signed)
Immediate Anesthesia Transfer of Care Note  Patient: [name]  Procedure(s) Performed: Procedure(s) (LRB) with comments: KNEE ARTHROSCOPY WITH MEDIAL MENISECTOMY (Right) -  medial and lateral menisectomy and Abrasion chondoplasty of the patella  Patient Location: PACU  Anesthesia Type: General  Level of Consciousness: awake, alert , oriented, patient cooperative and responds to stimulation  Airway & Oxygen Therapy: Patient Spontanous Breathing and Patient connected to face mask oxygen  Post-op Assessment: Report given to PACU RN, Post -op Vital signs reviewed and stable and Patient moving all extremities  Post vital signs: Reviewed and stable  Complications: No apparent anesthesia complications

## 2011-11-11 NOTE — Op Note (Signed)
NAME:  [name], [name]                 ACCOUNT NO.:  [id number]  MEDICAL RECORD NO.:  [id number]  LOCATION:  [facility]                         FACILITY:  [facility]  PHYSICIAN:  Georges Lynch. Kolina Kube, M.D.DATE OF BIRTH:  August 18, 1940  DATE OF PROCEDURE:  11/10/2011 DATE OF DISCHARGE:  11/10/2011                              OPERATIVE REPORT   SURGEON:  Windy Fast A. Courtland Reas, MD  ASSISTANT:  The OR tech.  PREOPERATIVE DIAGNOSES: 1. Torn medial meniscus, right knee. 2. Torn lateral meniscus, right knee. 3. Osteoarthritis, right knee involving the patellofemoral joint as     well.  POSTOPERATIVE DIAGNOSES: 1. Torn medial meniscus, right knee. 2. Torn lateral meniscus, right knee. 3. Osteoarthritis, right knee involving the patellofemoral joint as     well.  OPERATION: 1. Diagnostic arthroscopy, right knee. 2. Medial meniscectomy of posterior horn, right knee. 3. Lateral meniscectomy, right knee. 4. Synovectomy, suprapatellar pouch, right knee. 5. Abrasion chondroplasty of the patella, right knee.  PROCEDURE:  Under general anesthesia, routine orthopedic prep and draping of the right lower extremity was carried out.  The patient's right leg was placed in a knee holder.  Knee holder was well padded at this time.  He had 2 g of IV Ancef.  Prior to making the incision, the appropriate time-out was carried out.  Prior to that I marked the appropriate right leg in the holding area.  This time a small punctate incision made in suprapatellar pouch, inflow cannula was inserted, knee was distended with saline.  Another small punctate incision was made in the anterolateral joint.  The arthroscope was entered from lateral approach and a complete diagnostic arthroscopy was carried out. Following that I made a small incision over the medial joint.  I probed the meniscus and meniscus is the only tear we noted medially with the medial meniscus posteriorly and some medially.  I did a partial medial meniscectomy.   Also she had some significant arthritic changes in the medial joint.  Following that, I went over to the cruciates and cruciates were intact.  I went over the lateral joint.  In lateral joint, she had tears of the lateral meniscus as well.  I did a partial lateral meniscectomy.  Following that, I went up in the suprapatellar pouch.  She had severe chondromalacia of the patella.  I did an abrasion chondroplasty of the patella.  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 Thoroughly irrigated out the knee, reinspected the knee.  There were no other abnormalities noted.  I then closed all 3 punctate incisions with 3-0 nylon suture.  I injected 30 mL of 0.25% Marcaine, epinephrine into the knee joint.  Sterile Neosporin dressing was applied.  FOLLOWUP CARE:  She will go back on her Plavix starting tomorrow.  She was initially on Plavix.  We stopped that 5 days before.  She was bridged with Lovenox and Lovenox was discontinued for today's surgery and she will go back on her Plavix.  Also the remaining part of the instructions will all be written down.          ______________________________ Georges Lynch. Maisen Klingler,  M.D.     RAG/MEDQ  D:  11/10/2011  T:  11/11/2011  Job:  [id number]  cc:   Charlcie Cradle. Delford Field, MD, FCCP [address] [place] [address]

## 2011-11-16 ENCOUNTER — Other Ambulatory Visit: Payer: Self-pay | Admitting: Critical Care Medicine

## 2011-11-18 ENCOUNTER — Other Ambulatory Visit: Payer: Self-pay | Admitting: *Deleted

## 2011-11-18 MED ORDER — DOXAZOSIN MESYLATE 8 MG PO TABS: 8.0000 mg | ORAL_TABLET | Freq: Every day | ORAL | Status: DC

## 2011-11-18 NOTE — Telephone Encounter (Signed)
Fax Received. Refill Completed. [name] (R.M.A)   

## 2011-11-24 ENCOUNTER — Ambulatory Visit: Payer: Medicare Other | Admitting: Critical Care Medicine

## 2011-12-06 ENCOUNTER — Other Ambulatory Visit: Payer: Self-pay | Admitting: Critical Care Medicine

## 2011-12-22 ENCOUNTER — Encounter: Payer: Self-pay | Admitting: Critical Care Medicine

## 2011-12-22 ENCOUNTER — Ambulatory Visit (INDEPENDENT_AMBULATORY_CARE_PROVIDER_SITE_OTHER): Payer: Medicare Other | Admitting: Critical Care Medicine

## 2011-12-22 VITALS — BP 122/72 | HR 77 | Temp 98.3°F | Ht 63.0 in | Wt 225.8 lb

## 2011-12-22 DIAGNOSIS — J45909 Unspecified asthma, uncomplicated: Secondary | ICD-10-CM

## 2011-12-22 MED ORDER — FLUTICASONE FUROATE-VILANTEROL 100-25 MCG/INH IN AEPB: 1.0000 | INHALATION_SPRAY | Freq: Every day | RESPIRATORY_TRACT | Status: DC

## 2011-12-22 MED ORDER — PREDNISONE 10 MG PO TABS: ORAL_TABLET | ORAL | Status: DC

## 2011-12-22 NOTE — Progress Notes (Signed)
Subjective:    Patient ID: [name], female    DOB: 02/24/1940, 71 y.o.   MRN: [id number]  HPI  07/19/2011 Not seen since 04/2009. Hx of asthma.  Now dyspnea is worse.  Cough is better.  When has asthma attack will cough. Now aves asthma flare with weather change.  Now is dyspneic with exertion on any distance. Notes some chest tightness.   Notes some qhs dyspnea. >>advair 250 Twice daily   PFT >08/04/2011  PFTs:  FeV1 48%  FVC 60%  25% improvement with BD TLC 80%  DLCO 38%  79/13 Follow up  Returns for follow up of asthma  Still having symptoms on Advair with dyspnea on/off, wheezing and cough.  PFT showed mod/severe airflow obstruction with sign improvement at 25% post SABA  She does have hx of smoking in past.  No chest pain , hemoptysis or fever  Does get some swelling.  Does get dyspneic at night ? Orthopnea -mild   12/22/2011 Pt still with wheezing and cough. Using Alb frequently Pt out of advair several months d/t not able to afford meds.  Pt uses spiriva daily    Past Medical History  Diagnosis Date  . Coronary artery disease   . Diabetes mellitus   . Hypertension   . Hyperlipidemia   . Shoulder injury   . Chest pain   . Acute myocardial infarction, unspecified site, episode of care unspecified   . Unspecified asthma   . PONV (postoperative nausea and vomiting)   . Anxiety   . GERD (gastroesophageal reflux disease)      Family History  Problem Relation Age of Onset  . Emphysema Paternal Uncle   . Heart disease Maternal Grandfather   . Esophageal cancer Brother      History   Social History  . Marital Status: Widowed    Spouse Name: N/A    Number of Children: 4  . Years of Education: N/A   Occupational History  .     Social History Main Topics  . Smoking status: Former Smoker -- 0.5 packs/day for 25 years    Types: Cigarettes    Quit date: 08/20/1978  . Smokeless tobacco: Never Used  . Alcohol Use: No  . Drug Use: No  . Sexually Active:  Not on file   Other Topics Concern  . Not on file   Social History Narrative  . No narrative on file     Allergies  Allergen Reactions  . Amlodipine   . Banana Nausea And Vomiting    Stomach pumped  . Codeine     Hallucinate, loose identity and don't know who I am  . Morphine Other (See Comments)    Can not function, it immobilizes me   . Sulfonamide Derivatives Swelling     Outpatient Prescriptions Prior to Visit  Medication Sig Dispense Refill  . albuterol (PROVENTIL HFA;VENTOLIN HFA) 108 (90 BASE) MCG/ACT inhaler Inhale 2 puffs into the lungs every 6 (six) hours as needed. For shortness of breath.      Marland Kitchen albuterol (PROVENTIL) (2.5 MG/3ML) 0.083% nebulizer solution Take 2.5 mg by nebulization every 6 (six) hours as needed. For shortness of breath.      . ALPRAZolam (XANAX) 0.25 MG tablet Take 0.25 mg by mouth 2 (two) times daily as needed.       Marland Kitchen atorvastatin (LIPITOR) 40 MG tablet Take 40 mg by mouth every other day. Takes at night      . Calcium & Magnesium Carbonates (  MYLANTA PO) Take 1 tablet by mouth daily as needed. For indigestion.      . Cholecalciferol (VITAMIN D) 2000 UNITS tablet Take 2,000 Units by mouth daily.       . clopidogrel (PLAVIX) 75 MG tablet Take 75 mg by mouth every morning.      Marland Kitchen doxazosin (CARDURA) 8 MG tablet Take 1 tablet (8 mg total) by mouth at bedtime.  30 tablet  5  . famotidine (PEPCID) 20 MG tablet TAKE 1 TABLET BY MOUTH EVERY DAY  30 tablet  0  . furosemide (LASIX) 40 MG tablet Take 40 mg by mouth every morning.      . insulin lispro protamine-insulin lispro (HUMALOG 75/25) (75-25) 100 UNIT/ML SUSP Inject 20-45 Units into the skin 2 (two) times daily with a meal. 20 in am, 45 at dinner      . losartan (COZAAR) 50 MG tablet Take 50 mg by mouth every morning.      . methocarbamol (ROBAXIN) 500 MG tablet Take 1 tablet by mouth 2 (two) times daily.       . Multiple Vitamin (MULTIVITAMIN WITH MINERALS) TABS Take 1 tablet by mouth daily.      .  nebivolol (BYSTOLIC) 10 MG tablet Take 5 mg by mouth every morning.       . nitroGLYCERIN (NITROSTAT) 0.4 MG SL tablet Place 0.4 mg under the tongue every 5 (five) minutes x 3 doses as needed. For chest pain.      Marland Kitchen omeprazole (PRILOSEC) 20 MG capsule Take 20 mg by mouth daily as needed. For indigestion.      . potassium chloride SA (K-DUR,KLOR-CON) 20 MEQ tablet Take 1 tablet (20 mEq total) by mouth daily.  30 tablet  11  . sitaGLIPtin (JANUVIA) 100 MG tablet Take 100 mg by mouth every morning.       . traMADol (ULTRAM) 50 MG tablet Take 50 mg by mouth every 6 (six) hours as needed. For pain.      . famotidine (PEPCID) 20 MG tablet Take 20 mg by mouth daily as needed. For stomach upset      . famotidine (PEPCID) 20 MG tablet Take 20 mg by mouth daily as needed. For stomach upset  30 tablet  0  . tiotropium (SPIRIVA HANDIHALER) 18 MCG inhalation capsule Place 1 capsule (18 mcg total) into inhaler and inhale daily.  30 capsule  5  . Fluticasone-Salmeterol (ADVAIR DISKUS) 250-50 MCG/DOSE AEPB Inhale 1 puff into the lungs 2 (two) times daily.  60 each  6      Review of Systems  Constitutional:   No  weight loss, night sweats,  Fevers, chills,  ++fatigue, lassitude. HEENT:   No headaches,  Difficulty swallowing,  Tooth/dental problems,  Sore throat,                No sneezing, itching, ear ache, nasal congestion, post nasal drip,   CV:  No chest pain,    , anasarca, dizziness, palpitations  GI  No heartburn, indigestion, abdominal pain, nausea, vomiting, diarrhea, change in bowel habits, loss of appetite  Resp:    No coughing up of blood.  No change in color of mucus.   .  No chest wall deformity  Skin: no rash or lesions.  GU: no dysuria, change in color of urine, no urgency or frequency.  No flank pain.  MS:  No joint pain or swelling.  No decreased range of motion.  No back pain.  Psych:  No change  in mood or affect. No depression or anxiety.  No memory loss.     Objective:    Physical Exam  Filed Vitals:   12/22/11 1012  BP: 122/72  Pulse: 77  Temp: 98.3 F (36.8 C)  TempSrc: Oral  Height: 5\' 3"  (1.6 m)  Weight: 225 lb 12.8 oz (102.422 kg)  SpO2: 93%    Gen: Pleasant, well obese , in no distress,  normal affect  ENT: No lesions,  mouth clear,  oropharynx clear, no postnasal drip  Neck: No JVD, no TMG, no carotid bruits  Lungs: No use of accessory muscles, no dullness to percussion, coarse BS   Cardiovascular: RRR, heart sounds normal, no murmur or gallops, tr  peripheral edema  Abdomen: soft and NT, no HSM,  BS normal  Musculoskeletal: No deformities, no cyanosis or clubbing  Neuro: alert, non focal  Skin: Warm, no lesions or rashes  No results found.        Assessment & Plan:   Extrinsic asthma, unspecified Severe persistent asthma with fixed airflow obstruction Lack of response to Spiriva and inability to consistently use Advair Plan Trial of Breo one puff daily D/c spiriva/advair Patient reinstructed as to proper inhaler technique    Updated Medication List Outpatient Encounter Prescriptions as of 12/22/2011  Medication Sig Dispense Refill  . albuterol (PROVENTIL HFA;VENTOLIN HFA) 108 (90 BASE) MCG/ACT inhaler Inhale 2 puffs into the lungs every 6 (six) hours as needed. For shortness of breath.      Marland Kitchen albuterol (PROVENTIL) (2.5 MG/3ML) 0.083% nebulizer solution Take 2.5 mg by nebulization every 6 (six) hours as needed. For shortness of breath.      . ALPRAZolam (XANAX) 0.25 MG tablet Take 0.25 mg by mouth 2 (two) times daily as needed.       Marland Kitchen atorvastatin (LIPITOR) 40 MG tablet Take 40 mg by mouth every other day. Takes at night      . Calcium & Magnesium Carbonates (MYLANTA PO) Take 1 tablet by mouth daily as needed. For indigestion.      . Cholecalciferol (VITAMIN D) 2000 UNITS tablet Take 2,000 Units by mouth daily.       . clopidogrel (PLAVIX) 75 MG tablet Take 75 mg by mouth every morning.      Marland Kitchen doxazosin (CARDURA) 8  MG tablet Take 1 tablet (8 mg total) by mouth at bedtime.  30 tablet  5  . famotidine (PEPCID) 20 MG tablet TAKE 1 TABLET BY MOUTH EVERY DAY  30 tablet  0  . furosemide (LASIX) 40 MG tablet Take 40 mg by mouth every morning.      Marland Kitchen HYDROmorphone (DILAUDID) 2 MG tablet Take 1 tablet by mouth Once daily as needed.      . insulin lispro protamine-insulin lispro (HUMALOG 75/25) (75-25) 100 UNIT/ML SUSP Inject 20-45 Units into the skin 2 (two) times daily with a meal. 20 in am, 45 at dinner      . losartan (COZAAR) 50 MG tablet Take 50 mg by mouth every morning.      . methocarbamol (ROBAXIN) 500 MG tablet Take 1 tablet by mouth 2 (two) times daily.       . Multiple Vitamin (MULTIVITAMIN WITH MINERALS) TABS Take 1 tablet by mouth daily.      . nebivolol (BYSTOLIC) 10 MG tablet Take 5 mg by mouth every morning.       . nitroGLYCERIN (NITROSTAT) 0.4 MG SL tablet Place 0.4 mg under the tongue every 5 (five) minutes x 3 doses  as needed. For chest pain.      Marland Kitchen omeprazole (PRILOSEC) 20 MG capsule Take 20 mg by mouth daily as needed. For indigestion.      . ONE TOUCH ULTRA TEST test strip 3-4 times daily as directed      . potassium chloride SA (K-DUR,KLOR-CON) 20 MEQ tablet Take 1 tablet (20 mEq total) by mouth daily.  30 tablet  11  . sitaGLIPtin (JANUVIA) 100 MG tablet Take 100 mg by mouth every morning.       . traMADol (ULTRAM) 50 MG tablet Take 50 mg by mouth every 6 (six) hours as needed. For pain.      . [DISCONTINUED] famotidine (PEPCID) 20 MG tablet Take 20 mg by mouth daily as needed. For stomach upset      . [DISCONTINUED] famotidine (PEPCID) 20 MG tablet Take 20 mg by mouth daily as needed. For stomach upset  30 tablet  0  . [DISCONTINUED] tiotropium (SPIRIVA HANDIHALER) 18 MCG inhalation capsule Place 1 capsule (18 mcg total) into inhaler and inhale daily.  30 capsule  5  . [DISCONTINUED] tiotropium (SPIRIVA) 18 MCG inhalation capsule Place 18 mcg into inhaler and inhale 2 (two) times daily.        . Fluticasone Furoate-Vilanterol (BREO ELLIPTA) 100-25 MCG/INH AEPB Inhale 1 puff into the lungs daily.  30 each  6  . Fluticasone Furoate-Vilanterol (BREO ELLIPTA) 100-25 MCG/INH AEPB Inhale 1 puff into the lungs daily.  14 each  0  . predniSONE (DELTASONE) 10 MG tablet Take 4 for three days 3 for three days 2 for three days 1 for three days and stop  30 tablet  0  . [DISCONTINUED] Fluticasone-Salmeterol (ADVAIR DISKUS) 250-50 MCG/DOSE AEPB Inhale 1 puff into the lungs 2 (two) times daily.  60 each  6

## 2011-12-22 NOTE — Patient Instructions (Signed)
Stop advair and Spiriva Start Breo one puff daily, use samples and one free refill Take prednisone 10mg  Take 4 for three days 3 for three days 2 for three days 1 for three days and stop Return 1 month for recheck

## 2011-12-22 NOTE — Assessment & Plan Note (Signed)
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

## 2012-01-03 ENCOUNTER — Other Ambulatory Visit: Payer: Self-pay | Admitting: Critical Care Medicine

## 2012-01-21 ENCOUNTER — Ambulatory Visit: Payer: Medicare Other | Admitting: Critical Care Medicine

## 2012-01-26 ENCOUNTER — Ambulatory Visit: Payer: Medicare Other | Admitting: Critical Care Medicine

## 2012-03-06 ENCOUNTER — Encounter: Payer: Self-pay | Admitting: Critical Care Medicine

## 2012-03-06 ENCOUNTER — Ambulatory Visit (INDEPENDENT_AMBULATORY_CARE_PROVIDER_SITE_OTHER): Payer: Medicare Other | Admitting: Critical Care Medicine

## 2012-03-06 VITALS — BP 130/78 | HR 73 | Temp 98.6°F | Ht 63.0 in | Wt 223.8 lb

## 2012-03-06 DIAGNOSIS — J45909 Unspecified asthma, uncomplicated: Secondary | ICD-10-CM

## 2012-03-06 MED ORDER — PREDNISONE 10 MG PO TABS: ORAL_TABLET | ORAL | Status: DC

## 2012-03-06 MED ORDER — BECLOMETHASONE DIPROPIONATE 40 MCG/ACT IN AERS: 2.0000 | INHALATION_SPRAY | Freq: Two times a day (BID) | RESPIRATORY_TRACT | Status: DC

## 2012-03-06 MED ORDER — ALBUTEROL SULFATE HFA 108 (90 BASE) MCG/ACT IN AERS: 2.0000 | INHALATION_SPRAY | Freq: Four times a day (QID) | RESPIRATORY_TRACT | Status: AC | PRN

## 2012-03-06 NOTE — Progress Notes (Signed)
Subjective:    Patient ID: [name], female    DOB: Dec 25, 1940, 72 y.o.   MRN: [id number]  HPI  03/06/2012 Cough is better.  Pt still with wheezing.  Cold air an issue.  Med access is an issue.   No mucus.  Notes some qhs dyspnea. Pt denies any significant sore throat, nasal congestion or excess secretions, fever, chills, sweats, unintended weight loss, pleurtic or exertional chest pain, orthopnea PND, or leg swelling Pt denies any increase in rescue therapy over baseline, denies waking up needing it or having any early am or nocturnal exacerbations of coughing/wheezing/or dyspnea. Pt also denies any obvious fluctuation in symptoms with  weather or environmental change or other alleviating or aggravating factors    Past Medical History  Diagnosis Date  . Coronary artery disease   . Diabetes mellitus   . Hypertension   . Hyperlipidemia   . Shoulder injury   . Chest pain   . Acute myocardial infarction, unspecified site, episode of care unspecified   . Unspecified asthma   . PONV (postoperative nausea and vomiting)   . Anxiety   . GERD (gastroesophageal reflux disease)      Family History  Problem Relation Age of Onset  . Emphysema Paternal Uncle   . Heart disease Maternal Grandfather   . Esophageal cancer Brother      History   Social History  . Marital Status: Widowed    Spouse Name: N/A    Number of Children: 4  . Years of Education: N/A   Occupational History  .     Social History Main Topics  . Smoking status: Former Smoker -- 0.5 packs/day for 25 years    Types: Cigarettes    Quit date: 08/20/1978  . Smokeless tobacco: Never Used  . Alcohol Use: No  . Drug Use: No  . Sexually Active: Not on file   Other Topics Concern  . Not on file   Social History Narrative  . No narrative on file     Allergies  Allergen Reactions  . Amlodipine   . Banana Nausea And Vomiting    Stomach pumped  . Codeine     Hallucinate, loose identity and don't know who  I am  . Morphine Other (See Comments)    Can not function, it immobilizes me   . Sulfonamide Derivatives Swelling     Outpatient Prescriptions Prior to Visit  Medication Sig Dispense Refill  . albuterol (PROVENTIL) (2.5 MG/3ML) 0.083% nebulizer solution Take 2.5 mg by nebulization every 6 (six) hours as needed. For shortness of breath.      . ALPRAZolam (XANAX) 0.25 MG tablet Take 0.25 mg by mouth 2 (two) times daily as needed.       Marland Kitchen atorvastatin (LIPITOR) 40 MG tablet Take 40 mg by mouth every other day. Takes at night      . Calcium & Magnesium Carbonates (MYLANTA PO) Take 1 tablet by mouth daily as needed. For indigestion.      . Cholecalciferol (VITAMIN D) 2000 UNITS tablet Take 2,000 Units by mouth daily.       . clopidogrel (PLAVIX) 75 MG tablet Take 75 mg by mouth every morning.      Marland Kitchen doxazosin (CARDURA) 8 MG tablet Take 1 tablet (8 mg total) by mouth at bedtime.  30 tablet  5  . Fluticasone Furoate-Vilanterol (BREO ELLIPTA) 100-25 MCG/INH AEPB Inhale 1 puff into the lungs daily.  30 each  6  . furosemide (LASIX) 40 MG  tablet Take 40 mg by mouth every morning.      Marland Kitchen HYDROmorphone (DILAUDID) 2 MG tablet Take 1 tablet by mouth Once daily as needed.      . insulin lispro protamine-insulin lispro (HUMALOG 75/25) (75-25) 100 UNIT/ML SUSP Inject 20-45 Units into the skin 2 (two) times daily with a meal. 20 in am, 45 at dinner      . losartan (COZAAR) 50 MG tablet Take 50 mg by mouth every morning.      . methocarbamol (ROBAXIN) 500 MG tablet Take 1 tablet by mouth 2 (two) times daily.       . Multiple Vitamin (MULTIVITAMIN WITH MINERALS) TABS Take 1 tablet by mouth daily.      . nebivolol (BYSTOLIC) 10 MG tablet Take 5 mg by mouth every morning.       . nitroGLYCERIN (NITROSTAT) 0.4 MG SL tablet Place 0.4 mg under the tongue every 5 (five) minutes x 3 doses as needed. For chest pain.      Marland Kitchen omeprazole (PRILOSEC) 20 MG capsule Take 20 mg by mouth daily as needed. For indigestion.      .  ONE TOUCH ULTRA TEST test strip 3-4 times daily as directed      . potassium chloride SA (K-DUR,KLOR-CON) 20 MEQ tablet Take 1 tablet (20 mEq total) by mouth daily.  30 tablet  11  . sitaGLIPtin (JANUVIA) 100 MG tablet Take 100 mg by mouth every morning.       . traMADol (ULTRAM) 50 MG tablet Take 50 mg by mouth every 6 (six) hours as needed. For pain.      . [DISCONTINUED] albuterol (PROVENTIL HFA;VENTOLIN HFA) 108 (90 BASE) MCG/ACT inhaler Inhale 2 puffs into the lungs every 6 (six) hours as needed. For shortness of breath.      . [DISCONTINUED] famotidine (PEPCID) 20 MG tablet TAKE 1 TABLET BY MOUTH EVERY DAY  30 tablet  0  . [DISCONTINUED] famotidine (PEPCID) 20 MG tablet TAKE 1 TABLET BY MOUTH EVERY DAY  30 tablet  3  . [DISCONTINUED] Fluticasone Furoate-Vilanterol (BREO ELLIPTA) 100-25 MCG/INH AEPB Inhale 1 puff into the lungs daily.  14 each  0  . [DISCONTINUED] predniSONE (DELTASONE) 10 MG tablet Take 4 for three days 3 for three days 2 for three days 1 for three days and stop  30 tablet  0  Last reviewed on 03/06/2012  3:27 PM by Storm Frisk, MD    Review of Systems  Constitutional:   No  weight loss, night sweats,  Fevers, chills,  ++fatigue, lassitude. HEENT:   No headaches,  Difficulty swallowing,  Tooth/dental problems,  Sore throat,                No sneezing, itching, ear ache, nasal congestion, post nasal drip,   CV:  No chest pain,    , anasarca, dizziness, palpitations  GI  No heartburn, indigestion, abdominal pain, nausea, vomiting, diarrhea, change in bowel habits, loss of appetite  Resp:    No coughing up of blood.  No change in color of mucus.   .  No chest wall deformity  Skin: no rash or lesions.  GU: no dysuria, change in color of urine, no urgency or frequency.  No flank pain.  MS:  No joint pain or swelling.  No decreased range of motion.  No back pain.  Psych:  No change in mood or affect. No depression or anxiety.  No memory loss.     Objective:  Physical Exam  Filed Vitals:   03/06/12 1520  BP: 130/78  Pulse: 73  Temp: 98.6 F (37 C)  TempSrc: Oral  Height: 5\' 3"  (1.6 m)  Weight: 223 lb 12.8 oz (101.515 kg)  SpO2: 94%    Gen: Pleasant, well obese , in no distress,  normal affect  ENT: No lesions,  mouth clear,  oropharynx clear, no postnasal drip  Neck: No JVD, no TMG, no carotid bruits  Lungs: No use of accessory muscles, no dullness to percussion, coarse BS , exp wheezes  Cardiovascular: RRR, heart sounds normal, no murmur or gallops, tr  peripheral edema  Abdomen: soft and NT, no HSM,  BS normal  Musculoskeletal: No deformities, no cyanosis or clubbing  Neuro: alert, non focal  Skin: Warm, no lesions or rashes     Assessment & Plan:   Severe persistent asthma Severe persistent asthma with mild flare Plan Pulse prednisone Maintain Breo daily Add qvar 40 two puff bid      Updated Medication List Outpatient Encounter Prescriptions as of 03/06/2012  Medication Sig Dispense Refill  . albuterol (PROVENTIL HFA;VENTOLIN HFA) 108 (90 BASE) MCG/ACT inhaler Inhale 2 puffs into the lungs every 6 (six) hours as needed. For shortness of breath.  1 Inhaler  0  . albuterol (PROVENTIL) (2.5 MG/3ML) 0.083% nebulizer solution Take 2.5 mg by nebulization every 6 (six) hours as needed. For shortness of breath.      . ALPRAZolam (XANAX) 0.25 MG tablet Take 0.25 mg by mouth 2 (two) times daily as needed.       Marland Kitchen atorvastatin (LIPITOR) 40 MG tablet Take 40 mg by mouth every other day. Takes at night      . Calcium & Magnesium Carbonates (MYLANTA PO) Take 1 tablet by mouth daily as needed. For indigestion.      . Cholecalciferol (VITAMIN D) 2000 UNITS tablet Take 2,000 Units by mouth daily.       . clopidogrel (PLAVIX) 75 MG tablet Take 75 mg by mouth every morning.      Marland Kitchen doxazosin (CARDURA) 8 MG tablet Take 1 tablet (8 mg total) by mouth at bedtime.  30 tablet  5  . famotidine (PEPCID) 20 MG tablet Take 20 mg by mouth  daily as needed.       . Fluticasone Furoate-Vilanterol (BREO ELLIPTA) 100-25 MCG/INH AEPB Inhale 1 puff into the lungs daily.  30 each  6  . furosemide (LASIX) 40 MG tablet Take 40 mg by mouth every morning.      Marland Kitchen HYDROmorphone (DILAUDID) 2 MG tablet Take 1 tablet by mouth Once daily as needed.      . insulin lispro protamine-insulin lispro (HUMALOG 75/25) (75-25) 100 UNIT/ML SUSP Inject 20-45 Units into the skin 2 (two) times daily with a meal. 20 in am, 45 at dinner      . losartan (COZAAR) 50 MG tablet Take 50 mg by mouth every morning.      . methocarbamol (ROBAXIN) 500 MG tablet Take 1 tablet by mouth 2 (two) times daily.       . Multiple Vitamin (MULTIVITAMIN WITH MINERALS) TABS Take 1 tablet by mouth daily.      . nebivolol (BYSTOLIC) 10 MG tablet Take 5 mg by mouth every morning.       . nitroGLYCERIN (NITROSTAT) 0.4 MG SL tablet Place 0.4 mg under the tongue every 5 (five) minutes x 3 doses as needed. For chest pain.      Marland Kitchen omeprazole (PRILOSEC) 20 MG capsule  Take 20 mg by mouth daily as needed. For indigestion.      . ONE TOUCH ULTRA TEST test strip 3-4 times daily as directed      . potassium chloride SA (K-DUR,KLOR-CON) 20 MEQ tablet Take 1 tablet (20 mEq total) by mouth daily.  30 tablet  11  . sitaGLIPtin (JANUVIA) 100 MG tablet Take 100 mg by mouth every morning.       . traMADol (ULTRAM) 50 MG tablet Take 50 mg by mouth every 6 (six) hours as needed. For pain.      . [DISCONTINUED] albuterol (PROVENTIL HFA;VENTOLIN HFA) 108 (90 BASE) MCG/ACT inhaler Inhale 2 puffs into the lungs every 6 (six) hours as needed. For shortness of breath.      . [DISCONTINUED] famotidine (PEPCID) 20 MG tablet TAKE 1 TABLET BY MOUTH EVERY DAY  30 tablet  0  . [DISCONTINUED] famotidine (PEPCID) 20 MG tablet TAKE 1 TABLET BY MOUTH EVERY DAY  30 tablet  3  . [DISCONTINUED] famotidine (PEPCID) 20 MG tablet       . [DISCONTINUED] Fluticasone Furoate-Vilanterol (BREO ELLIPTA) 100-25 MCG/INH AEPB Inhale 1  puff into the lungs daily.  14 each  0  . beclomethasone (QVAR) 40 MCG/ACT inhaler Inhale 2 puffs into the lungs 2 (two) times daily.  1 Inhaler  1  . predniSONE (DELTASONE) 10 MG tablet Take 4 for two days three for two days two for two days one for two days  20 tablet  0  . [DISCONTINUED] predniSONE (DELTASONE) 10 MG tablet Take 4 for three days 3 for three days 2 for three days 1 for three days and stop  30 tablet  0

## 2012-03-06 NOTE — Assessment & Plan Note (Signed)
Severe persistent asthma with mild flare Plan Pulse prednisone Maintain Breo daily Add qvar 40 two puff bid

## 2012-03-06 NOTE — Patient Instructions (Signed)
Stay on Breo one puff daily Sample of albuterol inhaler given Start Qvar two puff twice daily, use samples until gone Prednisone 10mg  Take 4 for two days three for two days two for two days one for two days (sent to pharmacy) Return 6 weeks

## 2012-04-17 ENCOUNTER — Other Ambulatory Visit (INDEPENDENT_AMBULATORY_CARE_PROVIDER_SITE_OTHER): Payer: Medicare Other

## 2012-04-17 ENCOUNTER — Ambulatory Visit (INDEPENDENT_AMBULATORY_CARE_PROVIDER_SITE_OTHER): Payer: Medicare Other | Admitting: Physician Assistant

## 2012-04-17 ENCOUNTER — Encounter: Payer: Self-pay | Admitting: Physician Assistant

## 2012-04-17 VITALS — BP 120/70 | HR 70 | Ht 63.0 in | Wt 229.1 lb

## 2012-04-17 DIAGNOSIS — I2510 Atherosclerotic heart disease of native coronary artery without angina pectoris: Secondary | ICD-10-CM

## 2012-04-17 DIAGNOSIS — R5383 Other fatigue: Secondary | ICD-10-CM

## 2012-04-17 DIAGNOSIS — I25810 Atherosclerosis of coronary artery bypass graft(s) without angina pectoris: Secondary | ICD-10-CM

## 2012-04-17 DIAGNOSIS — I5032 Chronic diastolic (congestive) heart failure: Secondary | ICD-10-CM

## 2012-04-17 DIAGNOSIS — R5381 Other malaise: Secondary | ICD-10-CM

## 2012-04-17 DIAGNOSIS — I10 Essential (primary) hypertension: Secondary | ICD-10-CM

## 2012-04-17 DIAGNOSIS — R079 Chest pain, unspecified: Secondary | ICD-10-CM

## 2012-04-17 DIAGNOSIS — Z01812 Encounter for preprocedural laboratory examination: Secondary | ICD-10-CM

## 2012-04-17 DIAGNOSIS — E785 Hyperlipidemia, unspecified: Secondary | ICD-10-CM

## 2012-04-17 DIAGNOSIS — R0989 Other specified symptoms and signs involving the circulatory and respiratory systems: Secondary | ICD-10-CM

## 2012-04-17 DIAGNOSIS — R0602 Shortness of breath: Secondary | ICD-10-CM

## 2012-04-17 DIAGNOSIS — J449 Chronic obstructive pulmonary disease, unspecified: Secondary | ICD-10-CM

## 2012-04-17 LAB — BASIC METABOLIC PANEL
BUN: 18 mg/dL (ref 6–23)
CO2: 30 mEq/L (ref 19–32)
Chloride: 108 mEq/L (ref 96–112)
Creatinine, Ser: 0.6 mg/dL (ref 0.4–1.2)
Glucose, Bld: 107 mg/dL — ABNORMAL HIGH (ref 70–99)

## 2012-04-17 LAB — CBC WITH DIFFERENTIAL/PLATELET
Basophils Absolute: 0 10*3/uL (ref 0.0–0.1)
Eosinophils Absolute: 0.1 10*3/uL (ref 0.0–0.7)
HCT: 33.9 % — ABNORMAL LOW (ref 36.0–46.0)
Lymphs Abs: 1.8 10*3/uL (ref 0.7–4.0)
MCHC: 31.8 g/dL (ref 30.0–36.0)
MCV: 75.2 fl — ABNORMAL LOW (ref 78.0–100.0)
Monocytes Absolute: 0.6 10*3/uL (ref 0.1–1.0)
Neutrophils Relative %: 63.2 % (ref 43.0–77.0)
Platelets: 283 10*3/uL (ref 150.0–400.0)
RDW: 19.2 % — ABNORMAL HIGH (ref 11.5–14.6)
WBC: 7 10*3/uL (ref 4.5–10.5)

## 2012-04-17 LAB — BRAIN NATRIURETIC PEPTIDE: Pro B Natriuretic peptide (BNP): 158 pg/mL — ABNORMAL HIGH (ref 0.0–100.0)

## 2012-04-17 LAB — PROTIME-INR
INR: 1.1 ratio — ABNORMAL HIGH (ref 0.8–1.0)
Prothrombin Time: 12 s (ref 10.2–12.4)

## 2012-04-17 LAB — TSH: TSH: 1.26 u[IU]/mL (ref 0.35–5.50)

## 2012-04-17 NOTE — Progress Notes (Signed)
[address], [address] [place], [place]  [address] Phone: [phone], Fax:  [phone]  Date:  04/17/2012   ID:  [name], DOB 04/18/1940, MRN [id number]  PCP:  Alva Garnet., MD  Primary Cardiologist:  Dr. Delane Ginger     History of Present Illness: [name] is a 72 y.o. female who returns for the evaluation of chest pain.  She has a hx of CAD, s/p CABG, diastolic CHF, DM2, HTN, HL. LHC 07/2009: pLAD 60%, pRCA 30-40%, SVG to the RCA functionally occluded, SVG-diagonal patent, LIMA to the LAD atretic, EF 60%. Her last nuclear study 01/2011: EF 72%, small fixed inferobasal defect suggestive of thinning, no ischemia. Echo 09/2011: EF 60-55%, normal wall motion, grade 1 diastolic dysfunction, severe MAC.  PFTs in 07/2011 demonstrated moderate to severe airflow obstruction with an FEV1 of 48% and FVC of 60%. She did have positive response to bronchodilator.  Last seen by Dr. Elease Hashimoto 8/13 with plans for 6 month follow up.  She notes development of chest pressure over the last 1 month along with fatigue.  She underwent treatment for asthma exacerbation. However, her chest discomfort did not improve.  She states her pain is just like prior angina before her CABG.  She notes assoc dyspnea and radiation to right neck.  No assoc nausea or diaphoresis.  She has felt near syncopal at times.  She has taken NTG with relief.  She notes pain occurs at any time.  Of note, she can exert herself without pain.  However, she is NYHA Class III.  This is chronic without change.  No orthopnea, PND, edema.  Labs (8/13):  K 3.8, creatinine 0.79, ALT 11, LDL 102 Labs (9/13):  K 4.5, creatinine 0.90, Hgb 10.6 (MCV 76.2)  Wt Readings from Last 3 Encounters:  04/17/12 229 lb 1.9 oz (103.928 kg)  03/06/12 223 lb 12.8 oz (101.515 kg)  12/22/11 225 lb 12.8 oz (102.422 kg)     Past Medical History  Diagnosis Date  . Coronary artery disease   . Diabetes mellitus   . Hypertension   .  Hyperlipidemia   . Shoulder injury   . Chest pain   . Acute myocardial infarction, unspecified site, episode of care unspecified   . Unspecified asthma   . PONV (postoperative nausea and vomiting)   . Anxiety   . GERD (gastroesophageal reflux disease)     Current Outpatient Prescriptions  Medication Sig Dispense Refill  . albuterol (PROVENTIL HFA;VENTOLIN HFA) 108 (90 BASE) MCG/ACT inhaler Inhale 2 puffs into the lungs every 6 (six) hours as needed. For shortness of breath.  1 Inhaler  0  . albuterol (PROVENTIL) (2.5 MG/3ML) 0.083% nebulizer solution Take 2.5 mg by nebulization every 6 (six) hours as needed. For shortness of breath.      . ALPRAZolam (XANAX) 0.25 MG tablet Take 0.25 mg by mouth 2 (two) times daily as needed.       Marland Kitchen atorvastatin (LIPITOR) 40 MG tablet Take 40 mg by mouth every other day. Takes at night      . beclomethasone (QVAR) 40 MCG/ACT inhaler Inhale 2 puffs into the lungs 2 (two) times daily.  1 Inhaler  1  . Calcium & Magnesium Carbonates (MYLANTA PO) Take 1 tablet by mouth daily as needed. For indigestion.      . Cholecalciferol (VITAMIN D) 2000 UNITS tablet Take 2,000 Units by mouth daily.       . clopidogrel (PLAVIX) 75 MG tablet Take 75 mg  by mouth every morning.      Marland Kitchen doxazosin (CARDURA) 8 MG tablet Take 1 tablet (8 mg total) by mouth at bedtime.  30 tablet  5  . famotidine (PEPCID) 20 MG tablet Take 20 mg by mouth daily as needed.       . Fluticasone Furoate-Vilanterol (BREO ELLIPTA) 100-25 MCG/INH AEPB Inhale 1 puff into the lungs daily.  30 each  6  . furosemide (LASIX) 40 MG tablet Take 40 mg by mouth every morning.      Marland Kitchen HYDROmorphone (DILAUDID) 2 MG tablet Take 1 tablet by mouth Once daily as needed.      . insulin lispro protamine-insulin lispro (HUMALOG 75/25) (75-25) 100 UNIT/ML SUSP Inject 20-45 Units into the skin 2 (two) times daily with a meal. 20 in am, 45 at dinner      . losartan (COZAAR) 50 MG tablet Take 50 mg by mouth every morning.      .  methocarbamol (ROBAXIN) 500 MG tablet Take 1 tablet by mouth 2 (two) times daily.       . Multiple Vitamin (MULTIVITAMIN WITH MINERALS) TABS Take 1 tablet by mouth daily.      . nebivolol (BYSTOLIC) 10 MG tablet Take 5 mg by mouth every morning.       . nitroGLYCERIN (NITROSTAT) 0.4 MG SL tablet Place 0.4 mg under the tongue every 5 (five) minutes x 3 doses as needed. For chest pain.      Marland Kitchen omeprazole (PRILOSEC) 20 MG capsule Take 20 mg by mouth daily as needed. For indigestion.      . ONE TOUCH ULTRA TEST test strip 3-4 times daily as directed      . potassium chloride SA (K-DUR,KLOR-CON) 20 MEQ tablet Take 1 tablet (20 mEq total) by mouth daily.  30 tablet  11  . sitaGLIPtin (JANUVIA) 100 MG tablet Take 100 mg by mouth every morning.       . traMADol (ULTRAM) 50 MG tablet Take 50 mg by mouth every 6 (six) hours as needed. For pain.       No current facility-administered medications for this visit.    Allergies:    Allergies  Allergen Reactions  . Amlodipine   . Banana Nausea And Vomiting    Stomach pumped  . Codeine     Hallucinate, loose identity and don't know who I am  . Morphine Other (See Comments)    Can not function, it immobilizes me   . Sulfonamide Derivatives Swelling    Social History:  The patient  reports that she quit smoking about 33 years ago. Her smoking use included Cigarettes. She has a 12.5 pack-year smoking history. She has never used smokeless tobacco. She reports that she does not drink alcohol or use illicit drugs.   ROS:  Please see the history of present illness.   She does have a history of dysphagia. This is fairly chronic. She denies pleuritic chest pain. She denies melena, hematochezia, hematuria, vomiting, diarrhea, fevers, cough.   All other systems reviewed and negative.   PHYSICAL EXAM: VS:  BP 120/70  Pulse 70  Ht 5\' 3"  (1.6 m)  Wt 229 lb 1.9 oz (103.928 kg)  BMI 40.6 kg/m2 Well nourished, well developed, in no acute distress HEENT:  normal Neck: no JVD Cardiac:  normal S1, S2; RRR; no murmur Lungs:  Decreased breath sounds bilaterally, no wheezing, rhonchi or rales Abd: soft, nontender, no hepatomegaly Ext: no edema Skin: warm and dry Neuro:  CNs 2-12 intact,  no focal abnormalities noted  EKG:  NSR, HR 70, RBBB, no change from prior tracing     ASSESSMENT AND PLAN:  1. Chest Pain:  She has typical and atypical features. However, her symptoms are reminiscent of her previous angina prior to her bypass surgery. She tells me that she has not experienced these symptoms since prior to her bypass surgery.  She states her symptoms are getting worse and she notes significant fatigue.  We discussed rationale for further testing to include stress testing versus proceeding with cardiac catheterization. I have recommended cardiac catheterization. Also discussed this with Dr. Elease Hashimoto who agreed.  Risks and benefits of cardiac catheterization have been discussed with the patient.  These include bleeding, infection, kidney damage, stroke, heart attack, death. The patient understands these risks and is willing to proceed.   2. Coronary Artery Disease:  Continue aspirin, Plavix, statin. Proceed with cardiac catheterization as noted. 3. Diastolic CHF:  Volume appears to be stable. I will obtain a BNP and chest x-ray with her precath labs.  If edema on CXR, post pone cath and increase Lasix. 4. Fatigue:  Will check a  TSH with pre-cath labs as well.  5. Hypertension:  Controlled.  Continue current therapy.  6. Hyperlipidemia:  Continue statin.  7. COPD:  Follow up with pulmonology. 8. Disposition:  Proceed with cardiac cath as noted.   Luna Glasgow, PA-C  10:43 AM 04/17/2012

## 2012-04-17 NOTE — Patient Instructions (Addendum)
Your physician recommends that you return for lab work in: today (tsh, cbc, bmet, bnp, pt/inr)  A chest x-ray takes a picture of the organs and structures inside the chest, including the heart, lungs, and blood vessels. This test can show several things, including, whether the heart is enlarges; whether fluid is building up in the lungs; and whether pacemaker / defibrillator leads are still in place.  Your physician recommends that you continue on your current medications as directed. Please refer to the Current Medication list given to you today.  DO NOT take your insulin he morning of your cath.  You are scheduled for your cath on Thursday, 04/20/12.  Please refer to your letter for instructions.

## 2012-04-17 NOTE — H&P (Signed)
History and Physical  Date:  04/17/2012   ID:  EYMI LIPUMA, DOB [birth year], MRN [id number]  PCP:  Alva Garnet., MD  Primary Cardiologist:  Dr. Delane Ginger     History of Present Illness: [name] is a 72 y.o. female who returns for the evaluation of chest pain.  She has a hx of CAD, s/p CABG, diastolic CHF, DM2, HTN, HL. LHC 07/2009: pLAD 60%, pRCA 30-40%, SVG to the RCA functionally occluded, SVG-diagonal patent, LIMA to the LAD atretic, EF 60%. Her last nuclear study 01/2011: EF 72%, small fixed inferobasal defect suggestive of thinning, no ischemia. Echo 09/2011: EF 60-55%, normal wall motion, grade 1 diastolic dysfunction, severe MAC.  PFTs in 07/2011 demonstrated moderate to severe airflow obstruction with an FEV1 of 48% and FVC of 60%. She did have positive response to bronchodilator.  Last seen by Dr. Elease Hashimoto 8/13 with plans for 6 month follow up.  She notes development of chest pressure over the last 1 month along with fatigue.  She underwent treatment for asthma exacerbation. However, her chest discomfort did not improve.  She states her pain is just like prior angina before her CABG.  She notes assoc dyspnea and radiation to right neck.  No assoc nausea or diaphoresis.  She has felt near syncopal at times.  She has taken NTG with relief.  She notes pain occurs at any time.  Of note, she can exert herself without pain.  However, she is NYHA Class III.  This is chronic without change.  No orthopnea, PND, edema.  Labs (8/13):  K 3.8, creatinine 0.79, ALT 11, LDL 102 Labs (9/13):  K 4.5, creatinine 0.90, Hgb 10.6 (MCV 76.2)  Wt Readings from Last 3 Encounters:  04/17/12 229 lb 1.9 oz (103.928 kg)  03/06/12 223 lb 12.8 oz (101.515 kg)  12/22/11 225 lb 12.8 oz (102.422 kg)     Past Medical History  Diagnosis Date  . Coronary artery disease   . Diabetes mellitus   . Hypertension   . Hyperlipidemia   . Shoulder injury   . Chest pain   . Acute myocardial infarction,  unspecified site, episode of care unspecified   . Unspecified asthma   . PONV (postoperative nausea and vomiting)   . Anxiety   . GERD (gastroesophageal reflux disease)     Current Outpatient Prescriptions  Medication Sig Dispense Refill  . albuterol (PROVENTIL HFA;VENTOLIN HFA) 108 (90 BASE) MCG/ACT inhaler Inhale 2 puffs into the lungs every 6 (six) hours as needed. For shortness of breath.  1 Inhaler  0  . albuterol (PROVENTIL) (2.5 MG/3ML) 0.083% nebulizer solution Take 2.5 mg by nebulization every 6 (six) hours as needed. For shortness of breath.      . ALPRAZolam (XANAX) 0.25 MG tablet Take 0.25 mg by mouth 2 (two) times daily as needed.       Marland Kitchen atorvastatin (LIPITOR) 40 MG tablet Take 40 mg by mouth every other day. Takes at night      . beclomethasone (QVAR) 40 MCG/ACT inhaler Inhale 2 puffs into the lungs 2 (two) times daily.  1 Inhaler  1  . Calcium & Magnesium Carbonates (MYLANTA PO) Take 1 tablet by mouth daily as needed. For indigestion.      . Cholecalciferol (VITAMIN D) 2000 UNITS tablet Take 2,000 Units by mouth daily.       . clopidogrel (PLAVIX) 75 MG tablet Take 75 mg by mouth every morning.      Marland Kitchen doxazosin (CARDURA) 8 MG  tablet Take 1 tablet (8 mg total) by mouth at bedtime.  30 tablet  5  . famotidine (PEPCID) 20 MG tablet Take 20 mg by mouth daily as needed.       . Fluticasone Furoate-Vilanterol (BREO ELLIPTA) 100-25 MCG/INH AEPB Inhale 1 puff into the lungs daily.  30 each  6  . furosemide (LASIX) 40 MG tablet Take 40 mg by mouth every morning.      Marland Kitchen HYDROmorphone (DILAUDID) 2 MG tablet Take 1 tablet by mouth Once daily as needed.      . insulin lispro protamine-insulin lispro (HUMALOG 75/25) (75-25) 100 UNIT/ML SUSP Inject 20-45 Units into the skin 2 (two) times daily with a meal. 20 in am, 45 at dinner      . losartan (COZAAR) 50 MG tablet Take 50 mg by mouth every morning.      . methocarbamol (ROBAXIN) 500 MG tablet Take 1 tablet by mouth 2 (two) times daily.        . Multiple Vitamin (MULTIVITAMIN WITH MINERALS) TABS Take 1 tablet by mouth daily.      . nebivolol (BYSTOLIC) 10 MG tablet Take 5 mg by mouth every morning.       . nitroGLYCERIN (NITROSTAT) 0.4 MG SL tablet Place 0.4 mg under the tongue every 5 (five) minutes x 3 doses as needed. For chest pain.      Marland Kitchen omeprazole (PRILOSEC) 20 MG capsule Take 20 mg by mouth daily as needed. For indigestion.      . ONE TOUCH ULTRA TEST test strip 3-4 times daily as directed      . potassium chloride SA (K-DUR,KLOR-CON) 20 MEQ tablet Take 1 tablet (20 mEq total) by mouth daily.  30 tablet  11  . sitaGLIPtin (JANUVIA) 100 MG tablet Take 100 mg by mouth every morning.       . traMADol (ULTRAM) 50 MG tablet Take 50 mg by mouth every 6 (six) hours as needed. For pain.       No current facility-administered medications for this visit.    Allergies:    Allergies  Allergen Reactions  . Amlodipine   . Banana Nausea And Vomiting    Stomach pumped  . Codeine     Hallucinate, loose identity and don't know who I am  . Morphine Other (See Comments)    Can not function, it immobilizes me   . Sulfonamide Derivatives Swelling    Social History:  The patient  reports that she quit smoking about 33 years ago. Her smoking use included Cigarettes. She has a 12.5 pack-year smoking history. She has never used smokeless tobacco. She reports that she does not drink alcohol or use illicit drugs.   ROS:  Please see the history of present illness.   She does have a history of dysphagia. This is fairly chronic. She denies pleuritic chest pain. She denies melena, hematochezia, hematuria, vomiting, diarrhea, fevers, cough.   All other systems reviewed and negative.   PHYSICAL EXAM: VS:  BP 120/70  Pulse 70  Ht 5\' 3"  (1.6 m)  Wt 229 lb 1.9 oz (103.928 kg)  BMI 40.6 kg/m2 Well nourished, well developed, in no acute distress HEENT: normal Neck: no JVD Cardiac:  normal S1, S2; RRR; no murmur Lungs:  Decreased breath sounds  bilaterally, no wheezing, rhonchi or rales Abd: soft, nontender, no hepatomegaly Ext: no edema Skin: warm and dry Neuro:  CNs 2-12 intact, no focal abnormalities noted  EKG:  NSR, HR 70, RBBB, no change from  prior tracing     ASSESSMENT AND PLAN:  1. Chest Pain:  She has typical and atypical features. However, her symptoms are reminiscent of her previous angina prior to her bypass surgery. She tells me that she has not experienced these symptoms since prior to her bypass surgery.  She states her symptoms are getting worse and she notes significant fatigue.  We discussed rationale for further testing to include stress testing versus proceeding with cardiac catheterization. I have recommended cardiac catheterization. Also discussed this with Dr. Elease Hashimoto who agreed.  Risks and benefits of cardiac catheterization have been discussed with the patient.  These include bleeding, infection, kidney damage, stroke, heart attack, death. The patient understands these risks and is willing to proceed.   2. Coronary Artery Disease:  Continue aspirin, Plavix, statin. Proceed with cardiac catheterization as noted. 3. Diastolic CHF:  Volume appears to be stable. I will obtain a BNP and chest x-ray with her precath labs.  If edema on CXR, post pone cath and increase Lasix. 4. Fatigue:  Will check a  TSH with pre-cath labs as well.  5. Hypertension:  Controlled.  Continue current therapy.  6. Hyperlipidemia:  Continue statin.  7. COPD:  Follow up with pulmonology. 8. Disposition:  Proceed with cardiac cath as noted.   Signed, Tereso Newcomer, PA-C  10:43 AM 04/17/2012     Attending Note:   The patient was seen and examined.  Agree with assessment and plan as noted above.  Changes made to the above note as needed.  Pt has a long hx of CP.  Agree with plans for cath   [name] Grove., MD, [facility] 04/23/2012, 9:43 AM

## 2012-04-18 ENCOUNTER — Ambulatory Visit (INDEPENDENT_AMBULATORY_CARE_PROVIDER_SITE_OTHER)
Admission: RE | Admit: 2012-04-18 | Discharge: 2012-04-18 | Disposition: A | Discharge status: Home / Self Care | Payer: Medicare Other | Source: Ambulatory Visit | Attending: Physician Assistant | Admitting: Physician Assistant

## 2012-04-18 ENCOUNTER — Telehealth: Payer: Self-pay | Admitting: *Deleted

## 2012-04-18 DIAGNOSIS — R0602 Shortness of breath: Secondary | ICD-10-CM

## 2012-04-18 DIAGNOSIS — I5032 Chronic diastolic (congestive) heart failure: Secondary | ICD-10-CM

## 2012-04-18 DIAGNOSIS — I25810 Atherosclerosis of coronary artery bypass graft(s) without angina pectoris: Secondary | ICD-10-CM

## 2012-04-18 DIAGNOSIS — Z01812 Encounter for preprocedural laboratory examination: Secondary | ICD-10-CM

## 2012-04-18 DIAGNOSIS — R079 Chest pain, unspecified: Secondary | ICD-10-CM

## 2012-04-18 NOTE — Telephone Encounter (Signed)
Message copied by Tarri Fuller on Tue Apr 18, 2012  1:05 PM ------      Message from: [place], [place] T      Created: Tue Apr 18, 2012  8:50 AM       Renal function, K+ ok      Hgb stable      BNP only minimally elevated (make sure she gets CXR done).  Continue current dose of Lasix for now.       Labs ok for cath.      Tereso Newcomer, PA-C  8:49 AM 04/18/2012 ------

## 2012-04-18 NOTE — Telephone Encounter (Signed)
pt notified about lab results with verbal understanding, pt asked did we get the results from the test she had on her neck that was done at a neuro office, she said Dr. Renae Gloss ordered test. I said no but have results sent to [place] please, pt said ok

## 2012-04-19 ENCOUNTER — Telehealth: Payer: Self-pay | Admitting: *Deleted

## 2012-04-19 NOTE — Telephone Encounter (Signed)
Message copied by Tarri Fuller on Wed Apr 19, 2012  4:22 PM ------      Message from: [place], [place] T      Created: Tue Apr 18, 2012  5:22 PM       No CHF.      Tereso Newcomer, PA-C  5:22 PM 04/18/2012 ------

## 2012-04-19 NOTE — Telephone Encounter (Signed)
pt notified about cxr, no CHF with verbal understanding

## 2012-04-20 ENCOUNTER — Encounter (HOSPITAL_BASED_OUTPATIENT_CLINIC_OR_DEPARTMENT_OTHER)
Admission: RE | Disposition: A | Discharge status: Home / Self Care | Payer: Self-pay | Source: Ambulatory Visit | Attending: Cardiovascular Disease

## 2012-04-20 ENCOUNTER — Inpatient Hospital Stay (HOSPITAL_BASED_OUTPATIENT_CLINIC_OR_DEPARTMENT_OTHER)
Admission: RE | Admit: 2012-04-20 | Discharge: 2012-04-20 | Disposition: A | Discharge status: Home / Self Care | Payer: Medicare Other | Source: Ambulatory Visit | Attending: Cardiovascular Disease | Admitting: Cardiovascular Disease

## 2012-04-20 DIAGNOSIS — I2510 Atherosclerotic heart disease of native coronary artery without angina pectoris: Secondary | ICD-10-CM | POA: Insufficient documentation

## 2012-04-20 DIAGNOSIS — E785 Hyperlipidemia, unspecified: Secondary | ICD-10-CM | POA: Insufficient documentation

## 2012-04-20 DIAGNOSIS — J449 Chronic obstructive pulmonary disease, unspecified: Secondary | ICD-10-CM | POA: Insufficient documentation

## 2012-04-20 DIAGNOSIS — I10 Essential (primary) hypertension: Secondary | ICD-10-CM | POA: Insufficient documentation

## 2012-04-20 DIAGNOSIS — E119 Type 2 diabetes mellitus without complications: Secondary | ICD-10-CM | POA: Insufficient documentation

## 2012-04-20 DIAGNOSIS — I509 Heart failure, unspecified: Secondary | ICD-10-CM | POA: Insufficient documentation

## 2012-04-20 DIAGNOSIS — J4489 Other specified chronic obstructive pulmonary disease: Secondary | ICD-10-CM | POA: Insufficient documentation

## 2012-04-20 DIAGNOSIS — I25810 Atherosclerosis of coronary artery bypass graft(s) without angina pectoris: Secondary | ICD-10-CM

## 2012-04-20 DIAGNOSIS — I5032 Chronic diastolic (congestive) heart failure: Secondary | ICD-10-CM | POA: Insufficient documentation

## 2012-04-20 DIAGNOSIS — R079 Chest pain, unspecified: Secondary | ICD-10-CM

## 2012-04-20 DIAGNOSIS — I252 Old myocardial infarction: Secondary | ICD-10-CM | POA: Insufficient documentation

## 2012-04-20 SURGERY — JV LEFT HEART CATHETERIZATION WITH CORONARY ANGIOGRAM

## 2012-04-20 MED ORDER — SODIUM CHLORIDE 0.9 % IJ SOLN: 3.0000 mL | Freq: Two times a day (BID) | INTRAMUSCULAR | Status: DC

## 2012-04-20 MED ORDER — ACETAMINOPHEN 325 MG PO TABS: 650.0000 mg | ORAL_TABLET | ORAL | Status: DC | PRN

## 2012-04-20 MED ORDER — ASPIRIN 81 MG PO CHEW
324.0000 mg | CHEWABLE_TABLET | ORAL | Status: AC
  Administered 2012-04-20: 324 mg via ORAL

## 2012-04-20 MED ORDER — ONDANSETRON HCL 4 MG/2ML IJ SOLN: 4.0000 mg | Freq: Four times a day (QID) | INTRAMUSCULAR | Status: DC | PRN

## 2012-04-20 MED ORDER — SODIUM CHLORIDE 0.9 % IV SOLN
INTRAVENOUS | Status: DC
  Administered 2012-04-20: 10:00:00 via INTRAVENOUS

## 2012-04-20 MED ORDER — SODIUM CHLORIDE 0.9 % IJ SOLN: 3.0000 mL | INTRAMUSCULAR | Status: DC | PRN

## 2012-04-20 MED ORDER — SODIUM CHLORIDE 0.9 % IV SOLN: INTRAVENOUS | Status: DC

## 2012-04-20 MED ORDER — SODIUM CHLORIDE 0.9 % IV SOLN
250.0000 mL | INTRAVENOUS | Status: DC | PRN
Start: 2012-04-20 — End: 2012-04-20

## 2012-04-20 NOTE — CV Procedure (Signed)
    Cardiac Cath Note  [name] [id number] 12-02-1940  Procedure: Left  Heart Cardiac Catheterization Note Indications: chest pain , hx of CAD and CABG  Procedure Details Consent: Obtained Time Out: Verified patient identification, verified procedure, site/side was marked, verified correct patient position, special equipment/implants available, Radiology Safety Procedures followed,  medications/allergies/relevent history reviewed, required imaging and test results available.  Performed   Medications: Fentanyl: 50 mcg IV Versed: 2 mg IV  The right femoral artery was easily canulated using a modified Seldinger technique.  Hemodynamics:    LV pressure: 138/30 Aortic pressure: 132/74  Angiography   Left Main: mild calcification at the ostium.  The LM is otherwise smooth and normal.  Left anterior Descending: Proximal 50%, mid 50-60%.  The distal LAD can be seen via the LIMA injections  Left Circumflex:  Large, normal.  OM 1 and OM 2 are moderate is size and are normal  Right Coronary Artery:  Large and dominant.  Proximal 40%-50%,  Mild irregularities in the mid segment.  PLSA and PDA are small and unremarkable.  SVG to RCA:  Occluded ( chronically occluded)  LIMA to LAD:   Small but widely patent.  There is a slight stenosis at the anastomosis but it does not appear to be obstructive.   SVG to 1st diag:  Normal graft, normal anastomosis   LV Gram: normal LV function  Complications: No apparent complications Patient did tolerate procedure well.  Contrast used:  125 cc  Conclusions:   1. Native CAD.  She has patent LIMA to LAD, SVG to D1, and an occluded SVG to RCA - but the native RCA has only a 40-50% stenosis proximally.   2. Normal LV function  3. Elevated LVEDP - her CP may be due to volume overload.  Will make sure she is avoiding salt and may make some medication adjustments.  Vesta Mixer, Montez Hageman., MD, [facility] 04/20/2012, 10:53 AM Office -  [phone] Pager [phone]

## 2012-04-20 NOTE — Progress Notes (Signed)
Bedrest begins @ 1105, tegaderm dressing applied to right groin site by Theodoro Grist, site level 0.

## 2012-05-01 ENCOUNTER — Ambulatory Visit (INDEPENDENT_AMBULATORY_CARE_PROVIDER_SITE_OTHER): Payer: Medicare Other | Admitting: Critical Care Medicine

## 2012-05-01 ENCOUNTER — Encounter: Payer: Self-pay | Admitting: Critical Care Medicine

## 2012-05-01 VITALS — BP 124/84 | HR 86 | Temp 98.3°F | Ht 63.0 in | Wt 229.0 lb

## 2012-05-01 DIAGNOSIS — J45909 Unspecified asthma, uncomplicated: Secondary | ICD-10-CM

## 2012-05-01 MED ORDER — CHLORPHENIRAMINE MALEATE 4 MG PO TABS: 8.0000 mg | ORAL_TABLET | Freq: Every day | ORAL | Status: DC

## 2012-05-01 NOTE — Patient Instructions (Signed)
Try chlorpheniramine 8mg  at bedtime No other medication changes Return 3 months

## 2012-05-01 NOTE — Progress Notes (Signed)
Subjective:    Patient ID: [name], female    DOB: 10-16-1940, 72 y.o.   MRN: [id number]  HPI  03/06/2012 Cough is better.  Pt still with wheezing.  Cold air an issue.  Med access is an issue.   No mucus.  Notes some qhs dyspnea. Pt denies any significant sore throat, nasal congestion or excess secretions, fever, chills, sweats, unintended weight loss, pleurtic or exertional chest pain, orthopnea PND, or leg swelling Pt denies any increase in rescue therapy over baseline, denies waking up needing it or having any early am or nocturnal exacerbations of coughing/wheezing/or dyspnea. Pt also denies any obvious fluctuation in symptoms with  weather or environmental change or other alleviating or aggravating factors  05/01/2012 Pt got pred and this helped. Pt on qvar plus breo.  Worse with temp changes.  No mucus, just dry cough.  Notes some chest tightness and wheeze.  Notes some pndrip.  Notes some qhs dyspnea.   Past Medical History  Diagnosis Date  . Coronary artery disease   . Diabetes mellitus   . Hypertension   . Hyperlipidemia   . Shoulder injury   . Chest pain   . Acute myocardial infarction, unspecified site, episode of care unspecified   . Unspecified asthma   . PONV (postoperative nausea and vomiting)   . Anxiety   . GERD (gastroesophageal reflux disease)      Family History  Problem Relation Age of Onset  . Emphysema Paternal Uncle   . Heart disease Maternal Grandfather   . Esophageal cancer Brother      History   Social History  . Marital Status: Widowed    Spouse Name: N/A    Number of Children: 4  . Years of Education: N/A   Occupational History  .     Social History Main Topics  . Smoking status: Former Smoker -- 0.50 packs/day for 25 years    Types: Cigarettes    Quit date: 08/20/1978  . Smokeless tobacco: Never Used  . Alcohol Use: No  . Drug Use: No  . Sexually Active: Not on file   Other Topics Concern  . Not on file   Social History  Narrative  . No narrative on file     Allergies  Allergen Reactions  . Amlodipine   . Banana Nausea And Vomiting    Stomach pumped  . Codeine     Hallucinate, loose identity and don't know who I am  . Morphine Other (See Comments)    Can not function, it immobilizes me   . Sulfonamide Derivatives Swelling     Outpatient Prescriptions Prior to Visit  Medication Sig Dispense Refill  . albuterol (PROVENTIL HFA;VENTOLIN HFA) 108 (90 BASE) MCG/ACT inhaler Inhale 2 puffs into the lungs every 6 (six) hours as needed. For shortness of breath.  1 Inhaler  0  . albuterol (PROVENTIL) (2.5 MG/3ML) 0.083% nebulizer solution Take 2.5 mg by nebulization every 6 (six) hours as needed. For shortness of breath.      . ALPRAZolam (XANAX) 0.25 MG tablet Take 0.25 mg by mouth 2 (two) times daily as needed.       Marland Kitchen atorvastatin (LIPITOR) 40 MG tablet Take 40 mg by mouth every other day. Takes at night      . beclomethasone (QVAR) 40 MCG/ACT inhaler Inhale 2 puffs into the lungs 2 (two) times daily.  1 Inhaler  1  . Calcium & Magnesium Carbonates (MYLANTA PO) Take 1 tablet by mouth daily as  needed. For indigestion.      . Cholecalciferol (VITAMIN D) 2000 UNITS tablet Take 2,000 Units by mouth daily.       . clopidogrel (PLAVIX) 75 MG tablet Take 75 mg by mouth every morning.      Marland Kitchen doxazosin (CARDURA) 8 MG tablet Take 1 tablet (8 mg total) by mouth at bedtime.  30 tablet  5  . famotidine (PEPCID) 20 MG tablet Take 20 mg by mouth daily as needed.       . Fluticasone Furoate-Vilanterol (BREO ELLIPTA) 100-25 MCG/INH AEPB Inhale 1 puff into the lungs daily.  30 each  6  . furosemide (LASIX) 40 MG tablet Take 40 mg by mouth every morning.      Marland Kitchen HYDROmorphone (DILAUDID) 2 MG tablet Take 1 tablet by mouth Once daily as needed.      . insulin lispro protamine-insulin lispro (HUMALOG 75/25) (75-25) 100 UNIT/ML SUSP Inject 20-45 Units into the skin 2 (two) times daily with a meal. 20 in am, 45 at dinner      .  losartan (COZAAR) 50 MG tablet Take 50 mg by mouth every morning.      . methocarbamol (ROBAXIN) 500 MG tablet Take 1 tablet by mouth 2 (two) times daily.       . Multiple Vitamin (MULTIVITAMIN WITH MINERALS) TABS Take 1 tablet by mouth daily.      . nebivolol (BYSTOLIC) 10 MG tablet Take 5 mg by mouth every morning.       . nitroGLYCERIN (NITROSTAT) 0.4 MG SL tablet Place 0.4 mg under the tongue every 5 (five) minutes x 3 doses as needed. For chest pain.      Marland Kitchen omeprazole (PRILOSEC) 20 MG capsule Take 20 mg by mouth daily as needed. For indigestion.      . ONE TOUCH ULTRA TEST test strip 3-4 times daily as directed      . sitaGLIPtin (JANUVIA) 100 MG tablet Take 100 mg by mouth every morning.       . potassium chloride SA (K-DUR,KLOR-CON) 20 MEQ tablet Take 1 tablet (20 mEq total) by mouth daily.  30 tablet  11  . traMADol (ULTRAM) 50 MG tablet Take 50 mg by mouth every 6 (six) hours as needed. For pain.       No facility-administered medications prior to visit.      Review of Systems  Constitutional:   No  weight loss, night sweats,  Fevers, chills,  ++fatigue, lassitude. HEENT:   No headaches,  Difficulty swallowing,  Tooth/dental problems,  Sore throat,                No sneezing, itching, ear ache, nasal congestion, post nasal drip,   CV:  No chest pain,    , anasarca, dizziness, palpitations  GI  No heartburn, indigestion, abdominal pain, nausea, vomiting, diarrhea, change in bowel habits, loss of appetite  Resp:    No coughing up of blood.  No change in color of mucus.   .  No chest wall deformity  Skin: no rash or lesions.  GU: no dysuria, change in color of urine, no urgency or frequency.  No flank pain.  MS:  No joint pain or swelling.  No decreased range of motion.  No back pain.  Psych:  No change in mood or affect. No depression or anxiety.  No memory loss.     Objective:   Physical Exam  Filed Vitals:   05/01/12 1403  BP: 124/84  Pulse: 86  Temp: 98.3 F  (36.8 C)  TempSrc: Oral  Height: 5\' 3"  (1.6 m)  Weight: 229 lb (103.874 kg)  SpO2: 92%    Gen: Pleasant, well obese , in no distress,  normal affect  ENT: No lesions,  mouth clear,  oropharynx clear,  ++ postnasal drip  Neck: No JVD, no TMG, no carotid bruits  Lungs: No use of accessory muscles, no dullness to percussion, coarse BS ,no wheezes  Cardiovascular: RRR, heart sounds normal, no murmur or gallops, tr  peripheral edema  Abdomen: soft and NT, no HSM,  BS normal  Musculoskeletal: No deformities, no cyanosis or clubbing  Neuro: alert, non focal  Skin: Warm, no lesions or rashes     Assessment & Plan:   Severe persistent asthma Severe persistent asthma improved at this time now only has residual postnasal drainage from chronic rhinitis Plan Attempt to use chlorpheniramine for postnasal drainage No other change in planned medications    Updated Medication List Outpatient Encounter Prescriptions as of 05/01/2012  Medication Sig Dispense Refill  . albuterol (PROVENTIL HFA;VENTOLIN HFA) 108 (90 BASE) MCG/ACT inhaler Inhale 2 puffs into the lungs every 6 (six) hours as needed. For shortness of breath.  1 Inhaler  0  . albuterol (PROVENTIL) (2.5 MG/3ML) 0.083% nebulizer solution Take 2.5 mg by nebulization every 6 (six) hours as needed. For shortness of breath.      . ALPRAZolam (XANAX) 0.25 MG tablet Take 0.25 mg by mouth 2 (two) times daily as needed.       Marland Kitchen atorvastatin (LIPITOR) 40 MG tablet Take 40 mg by mouth every other day. Takes at night      . beclomethasone (QVAR) 40 MCG/ACT inhaler Inhale 2 puffs into the lungs 2 (two) times daily.  1 Inhaler  1  . Calcium & Magnesium Carbonates (MYLANTA PO) Take 1 tablet by mouth daily as needed. For indigestion.      . Cholecalciferol (VITAMIN D) 2000 UNITS tablet Take 2,000 Units by mouth daily.       . clopidogrel (PLAVIX) 75 MG tablet Take 75 mg by mouth every morning.      Marland Kitchen doxazosin (CARDURA) 8 MG tablet Take 1  tablet (8 mg total) by mouth at bedtime.  30 tablet  5  . famotidine (PEPCID) 20 MG tablet Take 20 mg by mouth daily as needed.       . Fluticasone Furoate-Vilanterol (BREO ELLIPTA) 100-25 MCG/INH AEPB Inhale 1 puff into the lungs daily.  30 each  6  . furosemide (LASIX) 40 MG tablet Take 40 mg by mouth every morning.      Marland Kitchen HYDROmorphone (DILAUDID) 2 MG tablet Take 1 tablet by mouth Once daily as needed.      . insulin lispro protamine-insulin lispro (HUMALOG 75/25) (75-25) 100 UNIT/ML SUSP Inject 20-45 Units into the skin 2 (two) times daily with a meal. 20 in am, 45 at dinner      . losartan (COZAAR) 50 MG tablet Take 50 mg by mouth every morning.      . methocarbamol (ROBAXIN) 500 MG tablet Take 1 tablet by mouth 2 (two) times daily.       . Multiple Vitamin (MULTIVITAMIN WITH MINERALS) TABS Take 1 tablet by mouth daily.      . nebivolol (BYSTOLIC) 10 MG tablet Take 5 mg by mouth every morning.       . nitroGLYCERIN (NITROSTAT) 0.4 MG SL tablet Place 0.4 mg under the tongue every 5 (five) minutes x 3 doses as  needed. For chest pain.      Marland Kitchen omeprazole (PRILOSEC) 20 MG capsule Take 20 mg by mouth daily as needed. For indigestion.      . ONE TOUCH ULTRA TEST test strip 3-4 times daily as directed      . potassium chloride SA (K-DUR,KLOR-CON) 20 MEQ tablet Take 20 mEq by mouth 2 (two) times daily.      . sitaGLIPtin (JANUVIA) 100 MG tablet Take 100 mg by mouth every morning.       . [DISCONTINUED] potassium chloride SA (K-DUR,KLOR-CON) 20 MEQ tablet Take 1 tablet (20 mEq total) by mouth daily.  30 tablet  11  . [DISCONTINUED] traMADol (ULTRAM) 50 MG tablet Take 50 mg by mouth every 6 (six) hours as needed. For pain.      . chlorpheniramine (CHLOR-TRIMETON) 4 MG tablet Take 2 tablets (8 mg total) by mouth at bedtime.  30 tablet  6   No facility-administered encounter medications on file as of 05/01/2012.

## 2012-05-02 NOTE — Assessment & Plan Note (Signed)
Severe persistent asthma improved at this time now only has residual postnasal drainage from chronic rhinitis Plan Attempt to use chlorpheniramine for postnasal drainage No other change in planned medications

## 2012-05-08 ENCOUNTER — Other Ambulatory Visit: Payer: Self-pay | Admitting: *Deleted

## 2012-05-08 MED ORDER — DOXAZOSIN MESYLATE 8 MG PO TABS: 8.0000 mg | ORAL_TABLET | Freq: Every day | ORAL | Status: DC

## 2012-05-09 ENCOUNTER — Encounter: Payer: Self-pay | Admitting: Cardiovascular Disease

## 2012-05-12 ENCOUNTER — Encounter: Payer: Medicare Other | Admitting: Physician Assistant

## 2012-05-25 ENCOUNTER — Encounter: Payer: Self-pay | Admitting: Physician Assistant

## 2012-05-25 ENCOUNTER — Ambulatory Visit (INDEPENDENT_AMBULATORY_CARE_PROVIDER_SITE_OTHER): Payer: Medicare Other | Admitting: Physician Assistant

## 2012-05-25 VITALS — BP 134/72 | HR 70 | Ht 63.0 in | Wt 231.8 lb

## 2012-05-25 DIAGNOSIS — R079 Chest pain, unspecified: Secondary | ICD-10-CM

## 2012-05-25 DIAGNOSIS — I10 Essential (primary) hypertension: Secondary | ICD-10-CM

## 2012-05-25 DIAGNOSIS — I5032 Chronic diastolic (congestive) heart failure: Secondary | ICD-10-CM

## 2012-05-25 DIAGNOSIS — I2510 Atherosclerotic heart disease of native coronary artery without angina pectoris: Secondary | ICD-10-CM

## 2012-05-25 LAB — BASIC METABOLIC PANEL
BUN: 14 mg/dL (ref 6–23)
Creatinine, Ser: 0.8 mg/dL (ref 0.4–1.2)
GFR: 87.73 mL/min (ref >60.00)
Glucose, Bld: 167 mg/dL — ABNORMAL HIGH (ref 70–99)

## 2012-05-25 NOTE — Patient Instructions (Addendum)
LAB TODAY; BMET  YOU HAVE BEEN GIVEN 1 MONTH SUPPLY OF SAMPLES OF BYSTOLIC  PLEASE FOLLOW UP WITH DR. Elease Hashimoto IN 3 MONTHS

## 2012-05-25 NOTE — Progress Notes (Signed)
[address] [place], [place]  [address] Phone: [phone], Fax:  [phone]  Date:  05/25/2012   ID:  [name], DOB 1940/06/14, MRN [id number]  PCP:  Alva Garnet., MD  Primary Cardiologist:  Dr. Delane Ginger     History of Present Illness: [name] is a 72 y.o. female who returns for f/u after a recent heart cath.  She has a hx of CAD, s/p CABG, diastolic CHF, DM2, HTN, HL. LHC 07/2009 demonstrated 2/3 grafts patent. Her last nuclear study 01/2011: EF 72%, small fixed inferobasal defect suggestive of thinning, no ischemia. Echo 09/2011: EF 60-55%, normal wall motion, grade 1 diastolic dysfunction, severe MAC.  PFTs in 07/2011 demonstrated moderate to severe airflow obstruction with an FEV1 of 48% and FVC of 60%. She did have positive response to bronchodilator.    I saw her 3/10 with complaints of chest discomfort similar to her prior angina. She has chronic dyspnea with exertion. Cardiac catheterization was recommended. LHC 04/20/12: Proximal LAD 50%, mid LAD 50-60%, circumflex normal, proximal RCA 40-50%, SVG-RCA occluded, LIMA-LAD patent, SVG-D1 patent. Medical therapy was recommended. LVF was normal.  LVEDP was elevated.  Lasix and K+ were increased.  She is breathing better.  She is still NYHA Class III.  Notes less CP. Still gets CP sometimes.  Of note, she has been off of Bystolic for 3 mos due to cost.  No syncope.  She sleeps on 2 pillows chronically.    Labs (8/13):  K 3.8, creatinine 0.79, ALT 11, LDL 102 Labs (9/13):  K 4.5, creatinine 0.90, Hgb 10.6 (MCV 76.2) Labs (3/14):  K 4.4, Cr 0.6, Hgb 10.8, BNP 158, TSH 1.26  Wt Readings from Last 3 Encounters:  05/25/12 231 lb 12.8 oz (105.144 kg)  05/01/12 229 lb (103.874 kg)  04/20/12 229 lb (103.874 kg)     Past Medical History  Diagnosis Date  . Coronary artery disease   . Diabetes mellitus   . Hypertension   . Hyperlipidemia   . Shoulder injury   . Chest pain   . Acute myocardial  infarction, unspecified site, episode of care unspecified   . Unspecified asthma   . PONV (postoperative nausea and vomiting)   . Anxiety   . GERD (gastroesophageal reflux disease)     Current Outpatient Prescriptions  Medication Sig Dispense Refill  . albuterol (PROVENTIL HFA;VENTOLIN HFA) 108 (90 BASE) MCG/ACT inhaler Inhale 2 puffs into the lungs every 6 (six) hours as needed. For shortness of breath.  1 Inhaler  0  . albuterol (PROVENTIL) (2.5 MG/3ML) 0.083% nebulizer solution Take 2.5 mg by nebulization every 6 (six) hours as needed. For shortness of breath.      . ALPRAZolam (XANAX) 0.25 MG tablet Take 0.25 mg by mouth 2 (two) times daily as needed.       Marland Kitchen atorvastatin (LIPITOR) 40 MG tablet Take 40 mg by mouth every other day. Takes at night      . beclomethasone (QVAR) 40 MCG/ACT inhaler Inhale 2 puffs into the lungs 2 (two) times daily.  1 Inhaler  1  . Calcium & Magnesium Carbonates (MYLANTA PO) Take 1 tablet by mouth daily as needed. For indigestion.      . chlorpheniramine (CHLOR-TRIMETON) 4 MG tablet Take 2 tablets (8 mg total) by mouth at bedtime.  30 tablet  6  . Cholecalciferol (VITAMIN D) 2000 UNITS tablet Take 2,000 Units by mouth daily.       . clopidogrel (PLAVIX) 75  MG tablet Take 75 mg by mouth every morning.      . famotidine (PEPCID) 20 MG tablet Take 20 mg by mouth daily as needed.       . Fluticasone Furoate-Vilanterol (BREO ELLIPTA) 100-25 MCG/INH AEPB Inhale 1 puff into the lungs daily.  30 each  6  . furosemide (LASIX) 40 MG tablet Take 40 mg by mouth every morning.      Marland Kitchen HYDROmorphone (DILAUDID) 2 MG tablet Take 1 tablet by mouth Once daily as needed.      . insulin lispro protamine-insulin lispro (HUMALOG 75/25) (75-25) 100 UNIT/ML SUSP Inject 20-45 Units into the skin 2 (two) times daily with a meal. 20 in am, 45 at dinner      . losartan (COZAAR) 50 MG tablet Take 50 mg by mouth every morning.      . methocarbamol (ROBAXIN) 500 MG tablet Take 1 tablet by  mouth 2 (two) times daily.       . Multiple Vitamin (MULTIVITAMIN WITH MINERALS) TABS Take 1 tablet by mouth daily.      . nebivolol (BYSTOLIC) 10 MG tablet Take 5 mg by mouth every morning.       . nitroGLYCERIN (NITROSTAT) 0.4 MG SL tablet Place 0.4 mg under the tongue every 5 (five) minutes x 3 doses as needed. For chest pain.      Marland Kitchen omeprazole (PRILOSEC) 20 MG capsule Take 20 mg by mouth daily as needed. For indigestion.      . ONE TOUCH ULTRA TEST test strip 3-4 times daily as directed      . potassium chloride SA (K-DUR,KLOR-CON) 20 MEQ tablet Take 20 mEq by mouth 2 (two) times daily.      . sitaGLIPtin (JANUVIA) 100 MG tablet Take 100 mg by mouth every morning.       Marland Kitchen doxazosin (CARDURA) 8 MG tablet Take 1 tablet (8 mg total) by mouth at bedtime.  30 tablet  5   No current facility-administered medications for this visit.    Allergies:    Allergies  Allergen Reactions  . Amlodipine   . Banana Nausea And Vomiting    Stomach pumped  . Codeine     Hallucinate, loose identity and don't know who I am  . Morphine Other (See Comments)    Can not function, it immobilizes me   . Sulfonamide Derivatives Swelling    Social History:  The patient  reports that she quit smoking about 33 years ago. Her smoking use included Cigarettes. She has a 12.5 pack-year smoking history. She has never used smokeless tobacco. She reports that she does not drink alcohol or use illicit drugs.   ROS:  Please see the history of present illness.   No bleeding problems.  All other systems reviewed and negative.   PHYSICAL EXAM: VS:  BP 134/72  Pulse 70  Ht 5\' 3"  (1.6 m)  Wt 231 lb 12.8 oz (105.144 kg)  BMI 41.07 kg/m2 Well nourished, well developed, in no acute distress HEENT: normal Neck: no JVD Cardiac:  normal S1, S2; RRR; no murmur Lungs:  Decreased breath sounds bilaterally, no wheezing, rhonchi or rales Abd: soft, nontender, no hepatomegaly Ext: no edema; right groin without hematoma or bruit    Skin: warm and dry Neuro:  CNs 2-12 intact, no focal abnormalities noted  EKG:  NSR, HR 70, RBBB, no change from prior tracing     ASSESSMENT AND PLAN:  1. Chest Pain:  Stable anatomy by LHC.  CP may  be related to volume overload.  She is improved on higher dose Lasix.  Continue this dose.  She may also have microvascular ischemia.  She has been off of beta blocker for 3 mos.  I will give her samples of bystolic to restart this as well. 2. Coronary Artery Disease:  Continue aspirin, Plavix, statin.  3. Diastolic CHF:  Volume stable.  Continue current Rx.  Check BMET today. 4. Hypertension:  Controlled.     5. Hyperlipidemia:  Continue statin.  6. COPD:  Follow up with pulmonology as directed. 7. Disposition:  F/u with Dr. Delane Ginger in 3 mos.   Signed, Tereso Newcomer, PA-C  12:22 PM 05/25/2012

## 2012-05-26 ENCOUNTER — Telehealth: Payer: Self-pay | Admitting: *Deleted

## 2012-05-26 NOTE — Telephone Encounter (Signed)
Message copied by Tarri Fuller on Fri May 26, 2012 10:10 AM ------      Message from: [place], [place] T      Created: Thu May 25, 2012  5:54 PM       Renal function and potassium normal      Continue with current treatment plan.      Tereso Newcomer, PA-C  5:54 PM 05/25/2012 ------

## 2012-05-26 NOTE — Telephone Encounter (Signed)
pt notified about lab results with verbal understanding today.  

## 2012-05-30 ENCOUNTER — Other Ambulatory Visit: Payer: Self-pay | Admitting: *Deleted

## 2012-05-30 MED ORDER — OMEPRAZOLE 20 MG PO CPDR: 20.0000 mg | DELAYED_RELEASE_CAPSULE | Freq: Every day | ORAL | Status: DC | PRN

## 2012-07-17 ENCOUNTER — Telehealth: Payer: Self-pay | Admitting: Critical Care Medicine

## 2012-07-17 MED ORDER — FLUTICASONE FUROATE-VILANTEROL 100-25 MCG/INH IN AEPB: 1.0000 | INHALATION_SPRAY | Freq: Every day | RESPIRATORY_TRACT | Status: DC

## 2012-07-17 NOTE — Telephone Encounter (Signed)
1 sample up front  Pt aware 

## 2012-07-25 ENCOUNTER — Ambulatory Visit (INDEPENDENT_AMBULATORY_CARE_PROVIDER_SITE_OTHER)
Admission: RE | Admit: 2012-07-25 | Discharge: 2012-07-25 | Disposition: A | Discharge status: Home / Self Care | Payer: Medicare Other | Source: Ambulatory Visit | Attending: Critical Care Medicine | Admitting: Critical Care Medicine

## 2012-07-25 ENCOUNTER — Encounter: Payer: Self-pay | Admitting: Critical Care Medicine

## 2012-07-25 ENCOUNTER — Ambulatory Visit (INDEPENDENT_AMBULATORY_CARE_PROVIDER_SITE_OTHER): Payer: Medicare Other | Admitting: Critical Care Medicine

## 2012-07-25 VITALS — BP 130/72 | HR 70 | Temp 98.6°F | Ht 62.0 in | Wt 230.8 lb

## 2012-07-25 DIAGNOSIS — J45909 Unspecified asthma, uncomplicated: Secondary | ICD-10-CM

## 2012-07-25 DIAGNOSIS — J45902 Unspecified asthma with status asthmaticus: Secondary | ICD-10-CM

## 2012-07-25 MED ORDER — PREDNISONE 10 MG PO TABS: ORAL_TABLET | ORAL | Status: DC

## 2012-07-25 MED ORDER — BECLOMETHASONE DIPROPIONATE 80 MCG/ACT IN AERS: 2.0000 | INHALATION_SPRAY | Freq: Two times a day (BID) | RESPIRATORY_TRACT | Status: DC

## 2012-07-25 NOTE — Progress Notes (Signed)
Subjective:    Patient ID: [name], female    DOB: 09/01/1940, 72 y.o.   MRN: [id number]  HPI  07/25/2012 Chief Complaint  Patient presents with  . Asthma    pt states breathing is worse. having trouble taking a deep breath in,increase sob,and wheezing upon activity. PT also has a dry hacky cough x1wk     Pt worse with wheezing and dyspnea.  Notes some cough but is non productive.  Pt on breo and qvar . Also using neb med with albuterol.   Sinuses worse   Past Medical History  Diagnosis Date  . Coronary artery disease   . Diabetes mellitus   . Hypertension   . Hyperlipidemia   . Shoulder injury   . Chest pain   . Acute myocardial infarction, unspecified site, episode of care unspecified   . Unspecified asthma(493.90)   . PONV (postoperative nausea and vomiting)   . Anxiety   . GERD (gastroesophageal reflux disease)      Family History  Problem Relation Age of Onset  . Emphysema Paternal Uncle   . Heart disease Maternal Grandfather   . Esophageal cancer Brother      History   Social History  . Marital Status: Widowed    Spouse Name: N/A    Number of Children: 4  . Years of Education: N/A   Occupational History  .     Social History Main Topics  . Smoking status: Former Smoker -- 0.50 packs/day for 25 years    Types: Cigarettes    Quit date: 08/20/1978  . Smokeless tobacco: Never Used  . Alcohol Use: No  . Drug Use: No  . Sexually Active: Not on file   Other Topics Concern  . Not on file   Social History Narrative  . No narrative on file     Allergies  Allergen Reactions  . Amlodipine   . Banana Nausea And Vomiting    Stomach pumped  . Codeine     Hallucinate, loose identity and don't know who I am  . Morphine Other (See Comments)    Can not function, it immobilizes me   . Sulfonamide Derivatives Swelling     Outpatient Prescriptions Prior to Visit  Medication Sig Dispense Refill  . albuterol (PROVENTIL HFA;VENTOLIN HFA) 108 (90 BASE)  MCG/ACT inhaler Inhale 2 puffs into the lungs every 6 (six) hours as needed. For shortness of breath.  1 Inhaler  0  . albuterol (PROVENTIL) (2.5 MG/3ML) 0.083% nebulizer solution Take 2.5 mg by nebulization every 6 (six) hours as needed. For shortness of breath.      . ALPRAZolam (XANAX) 0.25 MG tablet Take 0.25 mg by mouth 2 (two) times daily as needed.       Marland Kitchen atorvastatin (LIPITOR) 40 MG tablet Take 40 mg by mouth every other day. Takes at night      . Calcium & Magnesium Carbonates (MYLANTA PO) Take 1 tablet by mouth daily as needed. For indigestion.      . chlorpheniramine (CHLOR-TRIMETON) 4 MG tablet Take 2 tablets (8 mg total) by mouth at bedtime.  30 tablet  6  . Cholecalciferol (VITAMIN D) 2000 UNITS tablet Take 2,000 Units by mouth daily.       . clopidogrel (PLAVIX) 75 MG tablet Take 75 mg by mouth every morning.      Marland Kitchen doxazosin (CARDURA) 8 MG tablet Take 1 tablet (8 mg total) by mouth at bedtime.  30 tablet  5  . famotidine (  PEPCID) 20 MG tablet Take 20 mg by mouth daily as needed.       . Fluticasone Furoate-Vilanterol (BREO ELLIPTA) 100-25 MCG/INH AEPB Inhale 1 puff into the lungs daily.  30 each  6  . furosemide (LASIX) 40 MG tablet Take 40 mg by mouth every morning.      . insulin lispro protamine-insulin lispro (HUMALOG 75/25) (75-25) 100 UNIT/ML SUSP Inject 20-45 Units into the skin 2 (two) times daily with a meal. 20 in am, 45 at dinner      . losartan (COZAAR) 50 MG tablet Take 50 mg by mouth every morning.      . methocarbamol (ROBAXIN) 500 MG tablet Take 1 tablet by mouth 2 (two) times daily.       . Multiple Vitamin (MULTIVITAMIN WITH MINERALS) TABS Take 1 tablet by mouth daily.      . nebivolol (BYSTOLIC) 10 MG tablet Take 5 mg by mouth every morning.       . nitroGLYCERIN (NITROSTAT) 0.4 MG SL tablet Place 0.4 mg under the tongue every 5 (five) minutes x 3 doses as needed. For chest pain.      Marland Kitchen omeprazole (PRILOSEC) 20 MG capsule Take 1 capsule (20 mg total) by mouth  daily as needed. For indigestion.  30 capsule  6  . ONE TOUCH ULTRA TEST test strip 3-4 times daily as directed      . potassium chloride SA (K-DUR,KLOR-CON) 20 MEQ tablet Take 20 mEq by mouth 2 (two) times daily.      . sitaGLIPtin (JANUVIA) 100 MG tablet Take 100 mg by mouth every morning.       . beclomethasone (QVAR) 40 MCG/ACT inhaler Inhale 2 puffs into the lungs 2 (two) times daily.  1 Inhaler  1  . HYDROmorphone (DILAUDID) 2 MG tablet Take 1 tablet by mouth Once daily as needed.       No facility-administered medications prior to visit.      Review of Systems Constitutional:   No  weight loss, night sweats,  Fevers, chills,  ++fatigue, lassitude. HEENT:   No headaches,  Difficulty swallowing,  Tooth/dental problems,  Sore throat,                No sneezing, itching, ear ache, nasal congestion, post nasal drip,   CV:  No chest pain,    , anasarca, dizziness, palpitations  GI  No heartburn, indigestion, abdominal pain, nausea, vomiting, diarrhea, change in bowel habits, loss of appetite  Resp:    No coughing up of blood.  No change in color of mucus.   .  No chest wall deformity  Skin: no rash or lesions.  GU: no dysuria, change in color of urine, no urgency or frequency.  No flank pain.  MS:  No joint pain or swelling.  No decreased range of motion.  No back pain.  Psych:  No change in mood or affect. No depression or anxiety.  No memory loss.     Objective:   Physical Exam  Filed Vitals:   07/25/12 1415  BP: 130/72  Pulse: 70  Temp: 98.6 F (37 C)  TempSrc: Oral  Height: 5\' 2"  (1.575 m)  Weight: 230 lb 12.8 oz (104.69 kg)  SpO2: 95%    Gen: Pleasant, well obese , in no distress,  normal affect  ENT: No lesions,  mouth clear,  oropharynx clear,  ++ postnasal drip  Neck: No JVD, no TMG, no carotid bruits  Lungs: No use of accessory muscles,  no dullness to percussion, coarse BS ,no wheezes  Cardiovascular: RRR, heart sounds normal, no murmur or gallops,  tr  peripheral edema  Abdomen: soft and NT, no HSM,  BS normal  Musculoskeletal: No deformities, no cyanosis or clubbing  Neuro: alert, non focal  Skin: Warm, no lesions or rashes  Dg Chest 2 View  07/25/2012   *RADIOLOGY REPORT*  Clinical Data: Asthma flare, cough, shortness of breath, chest pain, former smoker, past history coronary artery disease post MI, hypertension, diabetes  CHEST - 2 VIEW  Comparison: I 04/18/2012  Findings: Upper normal size of cardiac silhouette post CABG. Mediastinal contours and pulmonary vascularity normal. Lungs clear. No pleural effusion or pneumothorax. Reversed right shoulder prosthesis.  IMPRESSION: Post CABG. No acute abnormalities.   Original Report Authenticated By: Ulyses Southward, M.D.       Assessment & Plan:   Severe persistent asthma Severe persistent asthma with lower airway inflammation ongoing and recurrent exacerbation Plan Change Qvar to 80 two puff twice daily Prednisone 10mg  Take 4 for three days 3 for three days 2 for three days 1 for three days and stop Stay on Breo one daily Use Omeprazole daily 20mg   Return 2 weeks recheck with tammy parrett      Updated Medication List Outpatient Encounter Prescriptions as of 07/25/2012  Medication Sig Dispense Refill  . albuterol (PROVENTIL HFA;VENTOLIN HFA) 108 (90 BASE) MCG/ACT inhaler Inhale 2 puffs into the lungs every 6 (six) hours as needed. For shortness of breath.  1 Inhaler  0  . albuterol (PROVENTIL) (2.5 MG/3ML) 0.083% nebulizer solution Take 2.5 mg by nebulization every 6 (six) hours as needed. For shortness of breath.      . ALPRAZolam (XANAX) 0.25 MG tablet Take 0.25 mg by mouth 2 (two) times daily as needed.       Marland Kitchen atorvastatin (LIPITOR) 40 MG tablet Take 40 mg by mouth every other day. Takes at night      . Calcium & Magnesium Carbonates (MYLANTA PO) Take 1 tablet by mouth daily as needed. For indigestion.      . chlorpheniramine (CHLOR-TRIMETON) 4 MG tablet Take 2 tablets (8 mg  total) by mouth at bedtime.  30 tablet  6  . Cholecalciferol (VITAMIN D) 2000 UNITS tablet Take 2,000 Units by mouth daily.       . clopidogrel (PLAVIX) 75 MG tablet Take 75 mg by mouth every morning.      Marland Kitchen doxazosin (CARDURA) 8 MG tablet Take 1 tablet (8 mg total) by mouth at bedtime.  30 tablet  5  . famotidine (PEPCID) 20 MG tablet Take 20 mg by mouth daily as needed.       . Fluticasone Furoate-Vilanterol (BREO ELLIPTA) 100-25 MCG/INH AEPB Inhale 1 puff into the lungs daily.  30 each  6  . furosemide (LASIX) 40 MG tablet Take 40 mg by mouth every morning.      . insulin lispro protamine-insulin lispro (HUMALOG 75/25) (75-25) 100 UNIT/ML SUSP Inject 20-45 Units into the skin 2 (two) times daily with a meal. 20 in am, 45 at dinner      . losartan (COZAAR) 50 MG tablet Take 50 mg by mouth every morning.      . methocarbamol (ROBAXIN) 500 MG tablet Take 1 tablet by mouth 2 (two) times daily.       . Multiple Vitamin (MULTIVITAMIN WITH MINERALS) TABS Take 1 tablet by mouth daily.      . nebivolol (BYSTOLIC) 10 MG tablet Take 5 mg by  mouth every morning.       . nitroGLYCERIN (NITROSTAT) 0.4 MG SL tablet Place 0.4 mg under the tongue every 5 (five) minutes x 3 doses as needed. For chest pain.      Marland Kitchen omeprazole (PRILOSEC) 20 MG capsule Take 1 capsule (20 mg total) by mouth daily as needed. For indigestion.  30 capsule  6  . ONE TOUCH ULTRA TEST test strip 3-4 times daily as directed      . potassium chloride SA (K-DUR,KLOR-CON) 20 MEQ tablet Take 20 mEq by mouth 2 (two) times daily.      . sitaGLIPtin (JANUVIA) 100 MG tablet Take 100 mg by mouth every morning.       . [DISCONTINUED] beclomethasone (QVAR) 40 MCG/ACT inhaler Inhale 2 puffs into the lungs 2 (two) times daily.  1 Inhaler  1  . beclomethasone (QVAR) 80 MCG/ACT inhaler Inhale 2 puffs into the lungs 2 (two) times daily.  1 Inhaler  12  . HYDROmorphone (DILAUDID) 2 MG tablet Take 1 tablet by mouth Once daily as needed.      . predniSONE  (DELTASONE) 10 MG tablet Take 4 for three days 3 for three days 2 for three days 1 for three days and stop  30 tablet  0   No facility-administered encounter medications on file as of 07/25/2012.

## 2012-07-25 NOTE — Assessment & Plan Note (Signed)
Severe persistent asthma with lower airway inflammation ongoing and recurrent exacerbation Plan Change Qvar to 80 two puff twice daily Prednisone 10mg  Take 4 for three days 3 for three days 2 for three days 1 for three days and stop Stay on Breo one daily Use Omeprazole daily 20mg   Return 2 weeks recheck with tammy parrett

## 2012-07-25 NOTE — Patient Instructions (Addendum)
Change Qvar to 80 two puff twice daily Prednisone 10mg  Take 4 for three days 3 for three days 2 for three days 1 for three days and stop Stay on Breo one daily Use Omeprazole daily 20mg   Chest xray today Return 2 weeks recheck with tammy parrett

## 2012-07-26 ENCOUNTER — Telehealth: Payer: Self-pay | Admitting: Critical Care Medicine

## 2012-07-26 NOTE — Progress Notes (Signed)
Quick Note:  Notify the patient that the Xray is stable and no pneumonia No change in medications are recommended. Continue current meds as prescribed at last office visit ______ 

## 2012-07-26 NOTE — Progress Notes (Signed)
Quick Note:  [id number] ______ 

## 2012-07-26 NOTE — Telephone Encounter (Signed)
Notes Recorded by Storm Frisk, MD on 07/26/2012 at 6:12 AM Notify the patient that the Xray is stable and no pneumonia No change in medications are recommended. Continue current meds as prescribed at last office visit   I spoke with patient about results and she verbalized understanding and had no questions

## 2012-08-08 ENCOUNTER — Ambulatory Visit (INDEPENDENT_AMBULATORY_CARE_PROVIDER_SITE_OTHER): Payer: Medicare Other | Admitting: Adult Health

## 2012-08-08 ENCOUNTER — Encounter: Payer: Self-pay | Admitting: Adult Health

## 2012-08-08 VITALS — BP 108/66 | HR 83 | Temp 98.6°F | Ht 62.0 in | Wt 234.2 lb

## 2012-08-08 DIAGNOSIS — R0600 Dyspnea, unspecified: Secondary | ICD-10-CM

## 2012-08-08 DIAGNOSIS — R0609 Other forms of dyspnea: Secondary | ICD-10-CM

## 2012-08-08 DIAGNOSIS — J45909 Unspecified asthma, uncomplicated: Secondary | ICD-10-CM

## 2012-08-08 NOTE — Patient Instructions (Addendum)
Increase Qvar to 80 two puff twice daily Stay on Breo one daily Set up for ONO -oxygen check while you sleepy  follow up Dr. Delford Field  In 4-6 weeks and As needed   Please contact office for sooner follow up if symptoms do not improve or worsen or seek emergency care

## 2012-08-08 NOTE — Assessment & Plan Note (Addendum)
Recent flare with persistent symptoms of dyspnea ? Asthma related  cxr w/ no acute process. Recent card w/up w/ LHC no -anatomy stable Appears euvolemic without  obvious fluid overload.  Will check ONO for nocturnal desats.  Minimal desats in office with ambulation-does not qualify for supplement O2.  Will increase QVAR as previously recommended.  Cont on Breo  Follow up Dr. Delford Field  In 4 weeks

## 2012-08-08 NOTE — Progress Notes (Signed)
Subjective:    Patient ID: [name], female    DOB: [birth year], 72 y.o.   MRN: [id number]  HPI 72 yo with known hx of asthma, former smoker .  PFT >08/04/2011  PFTs:  FeV1 48%  FVC 60%  25% improvement with BD TLC 80%  DLCO 38%  08/08/2012 Follow up  Returns for 2 week follow up for asthma flare.  Seen 2 weeks ago, tx w/ steroid taper. CXR showed no acute process. Says  dry cough is improved but SOB and wheezing are unchanged with some chest tightness.    did not increase QVAR as directed. denies f/c/s No orthopnea , chest pain , edema , n/v, or fever.  Has been having this difficulty over last 6 months with progressive DOE , intermittent wheezing and dry cough.  Seen cardiac and underwent card cath [facility] 04/20/12: considered stable anatomy (prev CABG )   LVEDP was elevated. Continue on BB and diuretics.  Ambulatory walk in office with sat 89-90%.  No calf pain , hemotpysis.        Past Medical History  Diagnosis Date  . Coronary artery disease   . Diabetes mellitus   . Hypertension   . Hyperlipidemia   . Shoulder injury   . Chest pain   . Acute myocardial infarction, unspecified site, episode of care unspecified   . Unspecified asthma(493.90)   . PONV (postoperative nausea and vomiting)   . Anxiety   . GERD (gastroesophageal reflux disease)      Family History  Problem Relation Age of Onset  . Emphysema Paternal Uncle   . Heart disease Maternal Grandfather   . Esophageal cancer Brother      History   Social History  . Marital Status: Widowed    Spouse Name: N/A    Number of Children: 4  . Years of Education: N/A   Occupational History  .     Social History Main Topics  . Smoking status: Former Smoker -- 0.50 packs/day for 25 years    Types: Cigarettes    Quit date: 08/20/1978  . Smokeless tobacco: Never Used  . Alcohol Use: No  . Drug Use: No  . Sexually Active: Not on file   Other Topics Concern  . Not on file   Social History Narrative  . No  narrative on file     Allergies  Allergen Reactions  . Amlodipine   . Banana Nausea And Vomiting    Stomach pumped  . Codeine     Hallucinate, loose identity and don't know who I am  . Morphine Other (See Comments)    Can not function, it immobilizes me   . Sulfonamide Derivatives Swelling     Outpatient Prescriptions Prior to Visit  Medication Sig Dispense Refill  . albuterol (PROVENTIL HFA;VENTOLIN HFA) 108 (90 BASE) MCG/ACT inhaler Inhale 2 puffs into the lungs every 6 (six) hours as needed. For shortness of breath.  1 Inhaler  0  . albuterol (PROVENTIL) (2.5 MG/3ML) 0.083% nebulizer solution Take 2.5 mg by nebulization every 6 (six) hours as needed. For shortness of breath.      . ALPRAZolam (XANAX) 0.25 MG tablet Take 0.25 mg by mouth 2 (two) times daily as needed.       Marland Kitchen atorvastatin (LIPITOR) 40 MG tablet Take 40 mg by mouth every other day. Takes at night      . beclomethasone (QVAR) 80 MCG/ACT inhaler Inhale 2 puffs into the lungs 2 (two) times daily.  1  Inhaler  12  . Calcium & Magnesium Carbonates (MYLANTA PO) Take 1 tablet by mouth daily as needed. For indigestion.      . chlorpheniramine (CHLOR-TRIMETON) 4 MG tablet Take 2 tablets (8 mg total) by mouth at bedtime.  30 tablet  6  . Cholecalciferol (VITAMIN D) 2000 UNITS tablet Take 2,000 Units by mouth daily.       . clopidogrel (PLAVIX) 75 MG tablet Take 75 mg by mouth every morning.      Marland Kitchen doxazosin (CARDURA) 8 MG tablet Take 1 tablet (8 mg total) by mouth at bedtime.  30 tablet  5  . famotidine (PEPCID) 20 MG tablet Take 20 mg by mouth daily as needed.       . Fluticasone Furoate-Vilanterol (BREO ELLIPTA) 100-25 MCG/INH AEPB Inhale 1 puff into the lungs daily.  30 each  6  . furosemide (LASIX) 40 MG tablet Take 40 mg by mouth every morning.      Marland Kitchen HYDROmorphone (DILAUDID) 2 MG tablet Take 1 tablet by mouth Once daily as needed.      . insulin lispro protamine-insulin lispro (HUMALOG 75/25) (75-25) 100 UNIT/ML SUSP  Inject 20-45 Units into the skin 2 (two) times daily with a meal. 20 in am, 45 at dinner      . losartan (COZAAR) 50 MG tablet Take 50 mg by mouth every morning.      . methocarbamol (ROBAXIN) 500 MG tablet Take 1 tablet by mouth 2 (two) times daily.       . Multiple Vitamin (MULTIVITAMIN WITH MINERALS) TABS Take 1 tablet by mouth daily.      . nebivolol (BYSTOLIC) 10 MG tablet Take 5 mg by mouth every morning.       . nitroGLYCERIN (NITROSTAT) 0.4 MG SL tablet Place 0.4 mg under the tongue every 5 (five) minutes x 3 doses as needed. For chest pain.      Marland Kitchen omeprazole (PRILOSEC) 20 MG capsule Take 1 capsule (20 mg total) by mouth daily as needed. For indigestion.  30 capsule  6  . ONE TOUCH ULTRA TEST test strip 3-4 times daily as directed      . potassium chloride SA (K-DUR,KLOR-CON) 20 MEQ tablet Take 20 mEq by mouth 2 (two) times daily.      . sitaGLIPtin (JANUVIA) 100 MG tablet Take 100 mg by mouth every morning.       . predniSONE (DELTASONE) 10 MG tablet Take 4 for three days 3 for three days 2 for three days 1 for three days and stop  30 tablet  0   No facility-administered medications prior to visit.      Review of Systems  Constitutional:   No  weight loss, night sweats,  Fevers, chills,  ++fatigue, lassitude.+daytime sleepiness  HEENT:   No headaches,  Difficulty swallowing,  Tooth/dental problems,  Sore throat,                No sneezing, itching, ear ache, nasal congestion, post nasal drip,   CV:  No chest pain,    , anasarca, dizziness, palpitations  GI  No heartburn, indigestion, abdominal pain, nausea, vomiting, diarrhea, change in bowel habits, loss of appetite  Resp:    No coughing up of blood.  No change in color of mucus.   .  No chest wall deformity  Skin: no rash or lesions.  GU: no dysuria, change in color of urine, no urgency or frequency.  No flank pain.  MS:  No joint pain  or swelling.  No decreased range of motion.  No back pain.  Psych:  No change in mood  or affect. No depression or anxiety.  No memory loss.     Objective:   Physical Exam  Filed Vitals:   08/08/12 1507  BP: 108/66  Pulse: 83  Temp: 98.6 F (37 C)  TempSrc: Oral  Height: 5\' 2"  (1.575 m)  Weight: 234 lb 3.2 oz (106.232 kg)  SpO2: 96%    Gen: Pleasant, well obese , in no distress,  normal affect  ENT: No lesions,  mouth clear,  oropharynx clear, no postnasal drip  Neck: No JVD, no TMG, no carotid bruits  Lungs: No use of accessory muscles, no dullness to percussion, coarse BS   Cardiovascular: RRR, heart sounds normal, no murmur or gallops, tr  peripheral edema  Abdomen: soft and NT, no HSM,  BS normal  Musculoskeletal: No deformities, no cyanosis or clubbing  Neuro: alert, non focal  Skin: Warm, no lesions or rashes  No results found.        Assessment & Plan:   No problem-specific assessment & plan notes found for this encounter.   Updated Medication List Outpatient Encounter Prescriptions as of 08/08/2012  Medication Sig Dispense Refill  . albuterol (PROVENTIL HFA;VENTOLIN HFA) 108 (90 BASE) MCG/ACT inhaler Inhale 2 puffs into the lungs every 6 (six) hours as needed. For shortness of breath.  1 Inhaler  0  . albuterol (PROVENTIL) (2.5 MG/3ML) 0.083% nebulizer solution Take 2.5 mg by nebulization every 6 (six) hours as needed. For shortness of breath.      . ALPRAZolam (XANAX) 0.25 MG tablet Take 0.25 mg by mouth 2 (two) times daily as needed.       Marland Kitchen atorvastatin (LIPITOR) 40 MG tablet Take 40 mg by mouth every other day. Takes at night      . beclomethasone (QVAR) 80 MCG/ACT inhaler Inhale 2 puffs into the lungs 2 (two) times daily.  1 Inhaler  12  . Calcium & Magnesium Carbonates (MYLANTA PO) Take 1 tablet by mouth daily as needed. For indigestion.      . chlorpheniramine (CHLOR-TRIMETON) 4 MG tablet Take 2 tablets (8 mg total) by mouth at bedtime.  30 tablet  6  . Cholecalciferol (VITAMIN D) 2000 UNITS tablet Take 2,000 Units by mouth daily.        . clopidogrel (PLAVIX) 75 MG tablet Take 75 mg by mouth every morning.      Marland Kitchen doxazosin (CARDURA) 8 MG tablet Take 1 tablet (8 mg total) by mouth at bedtime.  30 tablet  5  . famotidine (PEPCID) 20 MG tablet Take 20 mg by mouth daily as needed.       . Fluticasone Furoate-Vilanterol (BREO ELLIPTA) 100-25 MCG/INH AEPB Inhale 1 puff into the lungs daily.  30 each  6  . furosemide (LASIX) 40 MG tablet Take 40 mg by mouth every morning.      Marland Kitchen HYDROmorphone (DILAUDID) 2 MG tablet Take 1 tablet by mouth Once daily as needed.      . insulin lispro protamine-insulin lispro (HUMALOG 75/25) (75-25) 100 UNIT/ML SUSP Inject 20-45 Units into the skin 2 (two) times daily with a meal. 20 in am, 45 at dinner      . losartan (COZAAR) 50 MG tablet Take 50 mg by mouth every morning.      . methocarbamol (ROBAXIN) 500 MG tablet Take 1 tablet by mouth 2 (two) times daily.       Marland Kitchen  Multiple Vitamin (MULTIVITAMIN WITH MINERALS) TABS Take 1 tablet by mouth daily.      . nebivolol (BYSTOLIC) 10 MG tablet Take 5 mg by mouth every morning.       . nitroGLYCERIN (NITROSTAT) 0.4 MG SL tablet Place 0.4 mg under the tongue every 5 (five) minutes x 3 doses as needed. For chest pain.      Marland Kitchen omeprazole (PRILOSEC) 20 MG capsule Take 1 capsule (20 mg total) by mouth daily as needed. For indigestion.  30 capsule  6  . ONE TOUCH ULTRA TEST test strip 3-4 times daily as directed      . potassium chloride SA (K-DUR,KLOR-CON) 20 MEQ tablet Take 20 mEq by mouth 2 (two) times daily.      . sitaGLIPtin (JANUVIA) 100 MG tablet Take 100 mg by mouth every morning.       . [DISCONTINUED] predniSONE (DELTASONE) 10 MG tablet Take 4 for three days 3 for three days 2 for three days 1 for three days and stop  30 tablet  0   No facility-administered encounter medications on file as of 08/08/2012.

## 2012-08-24 ENCOUNTER — Encounter: Payer: Self-pay | Admitting: Cardiovascular Disease

## 2012-08-31 ENCOUNTER — Encounter: Payer: Self-pay | Admitting: Adult Health

## 2012-09-01 ENCOUNTER — Telehealth: Payer: Self-pay | Admitting: Adult Health

## 2012-09-01 DIAGNOSIS — J45909 Unspecified asthma, uncomplicated: Secondary | ICD-10-CM

## 2012-09-01 NOTE — Telephone Encounter (Signed)
LMOM TCB x2

## 2012-09-01 NOTE — Telephone Encounter (Signed)
7.17.14 ONO results per TP: positive desats at sleep > begin O2 2lpm at bedtime.  Discuss more at [facility] w/ Dr Delford Field.  LMOM TCB x1 for patient Order sent to [facility] for new O2 start at bedtime. Will route back to my inbox.

## 2012-09-04 ENCOUNTER — Ambulatory Visit (INDEPENDENT_AMBULATORY_CARE_PROVIDER_SITE_OTHER): Payer: Medicare Other | Admitting: Cardiovascular Disease

## 2012-09-04 ENCOUNTER — Encounter: Payer: Self-pay | Admitting: Cardiovascular Disease

## 2012-09-04 VITALS — BP 140/88 | HR 76 | Ht 63.0 in | Wt 230.4 lb

## 2012-09-04 DIAGNOSIS — I10 Essential (primary) hypertension: Secondary | ICD-10-CM

## 2012-09-04 DIAGNOSIS — I2510 Atherosclerotic heart disease of native coronary artery without angina pectoris: Secondary | ICD-10-CM

## 2012-09-04 NOTE — Patient Instructions (Addendum)
Your physician wants you to follow-up in: 6 months  You will receive a reminder letter in the mail two months in advance. If you don't receive a letter, please call our office to schedule the follow-up appointment.  Your physician recommends that you continue on your current medications as directed. Please refer to the Current Medication list given to you today.  

## 2012-09-04 NOTE — Assessment & Plan Note (Signed)
Her blood pressure is stable.

## 2012-09-04 NOTE — Progress Notes (Signed)
[name] Date of Birth  [birth year] [facility] Office  [address]    [address]   [address] [place], [place]  [address]    [place], [place]  [phone] [phone]  Fax  [phone]  [phone]  Fax [phone]  Problem list: 1. Coronary artery disease-status post CABG, the saphenous vein graft to the right coronary artery is severely diseased but she has good flow to the distal right coronary artery from the native circulation. 2. Diabetes mellitus 3. Hypertension 4. Hyperlipidemia 5. COPD: June, 2013 - FEV1 equals 0.89 L which is 48% of predicted value. Her DLCO is also markedly depressed. 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   History of Present Illness:  [name] is a 72 y.o. female with the above noted hx.  She continues to have episodes of sharp pain ( few seconds)  followed by a burning pain ( last 6-7 minutes).  She usually does not take NTG because it causes a headache.  She was started on Lasix earlier this year. She seems to be doing much better on Lasix. She's had only function test which revealed severe obstructive lung disease with a good initial response to bronchodilators.  She is scheduled to have surgery all right knee. She's here today for preoperative evaluation prior to having the surgery.  September 04, 2012:  [name] is seen today for a follow up visit.  She saw Tereso Newcomer in April and was started on bystolic.  She has tolerated that fairly well.    She has some episodes of CP that are constant.    She is very stable.    Current Outpatient Prescriptions on File Prior to Visit  Medication Sig Dispense Refill  . albuterol (PROVENTIL HFA;VENTOLIN HFA) 108 (90 BASE) MCG/ACT inhaler Inhale 2 puffs into the lungs every 6 (six) hours as needed. For shortness of breath.  1 Inhaler  0  . albuterol (PROVENTIL) (2.5 MG/3ML) 0.083% nebulizer solution Take 2.5 mg by nebulization every 6  (six) hours as needed. For shortness of breath.      . ALPRAZolam (XANAX) 0.25 MG tablet Take 0.25 mg by mouth 2 (two) times daily as needed.       Marland Kitchen atorvastatin (LIPITOR) 40 MG tablet Take 40 mg by mouth every other day. Takes at night      . beclomethasone (QVAR) 80 MCG/ACT inhaler Inhale 2 puffs into the lungs 2 (two) times daily.  1 Inhaler  12  . Calcium & Magnesium Carbonates (MYLANTA PO) Take 1 tablet by mouth daily as needed. For indigestion.      . chlorpheniramine (CHLOR-TRIMETON) 4 MG tablet Take 2 tablets (8 mg total) by mouth at bedtime.  30 tablet  6  . Cholecalciferol (VITAMIN D) 2000 UNITS tablet Take 2,000 Units by mouth daily.       . clopidogrel (PLAVIX) 75 MG tablet Take 75 mg by mouth every morning.      Marland Kitchen doxazosin (CARDURA) 8 MG tablet Take 1 tablet (8 mg total) by mouth at bedtime.  30 tablet  5  . famotidine (PEPCID) 20 MG tablet Take 20 mg by mouth daily as needed.       . Fluticasone Furoate-Vilanterol (BREO ELLIPTA) 100-25 MCG/INH AEPB Inhale 1 puff into the lungs daily.  30 each  6  . furosemide (LASIX) 40 MG tablet Take 40 mg by mouth every morning.      Marland Kitchen  HYDROmorphone (DILAUDID) 2 MG tablet Take 1 tablet by mouth Once daily as needed.      . insulin lispro protamine-insulin lispro (HUMALOG 75/25) (75-25) 100 UNIT/ML SUSP Inject 20-45 Units into the skin 2 (two) times daily with a meal. 20 in am, 45 at dinner      . losartan (COZAAR) 50 MG tablet Take 50 mg by mouth every morning.      . methocarbamol (ROBAXIN) 500 MG tablet Take 1 tablet by mouth 2 (two) times daily.       . Multiple Vitamin (MULTIVITAMIN WITH MINERALS) TABS Take 1 tablet by mouth daily.      . nebivolol (BYSTOLIC) 10 MG tablet Take 5 mg by mouth every morning.       . nitroGLYCERIN (NITROSTAT) 0.4 MG SL tablet Place 0.4 mg under the tongue every 5 (five) minutes x 3 doses as needed. For chest pain.      Marland Kitchen omeprazole (PRILOSEC) 20 MG capsule Take 1 capsule (20 mg total) by mouth daily as needed.  For indigestion.  30 capsule  6  . ONE TOUCH ULTRA TEST test strip 3-4 times daily as directed      . potassium chloride SA (K-DUR,KLOR-CON) 20 MEQ tablet Take 20 mEq by mouth 2 (two) times daily.      . sitaGLIPtin (JANUVIA) 100 MG tablet Take 100 mg by mouth every morning.        No current facility-administered medications on file prior to visit.    Allergies  Allergen Reactions  . Amlodipine   . Banana Nausea And Vomiting    Stomach pumped  . Codeine     Hallucinate, loose identity and don't know who I am  . Morphine Other (See Comments)    Can not function, it immobilizes me   . Sulfonamide Derivatives Swelling    Past Medical History  Diagnosis Date  . Coronary artery disease   . Diabetes mellitus   . Hypertension   . Hyperlipidemia   . Shoulder injury   . Chest pain   . Acute myocardial infarction, unspecified site, episode of care unspecified   . Unspecified asthma(493.90)   . PONV (postoperative nausea and vomiting)   . Anxiety   . GERD (gastroesophageal reflux disease)     Past Surgical History  Procedure Laterality Date  . Cardiac catheterization  07/23/2009    EF 60%  . Cardiac catheterization  10/11/2008  . Cardiac catheterization  03/01/2007    EF 75-80%  . Coronary artery bypass graft      SEVERELY DISEASED SAPHENOUS VEIN GRAFT TO THE RIGHT CORONARY ARTERY BUT WITH FAIRLY WELL PRESERVED FLOW TO THE DISTAL RIGHT CORONARY ARTERY FROM THE NATIVE CIRCULATION-RESTART  CATH IN JUNE 2000, REVEALS MILD/MODERATE  CAD WITH GOOD FLOW DOWN HER LAD  . US echocardiography  03/08/2008    EF 55-60%  . Cardiovascular stress test  11/15/2007    EF 60%  . Rotator cuff repair      right and left  . Tumor removed kidney    . Tonsillectomy      age 78  . Abdominal hysterectomy    . Appendectomy      came out with Hysterectomy  . Eye surgery      bilateral cataract surgery with lens implant    History  Smoking status  . Former Smoker -- 0.50 packs/day for 25 years  .  Types: Cigarettes  . Quit date: 08/20/1978  Smokeless tobacco  . Never Used  History  Alcohol Use No    Family History  Problem Relation Age of Onset  . Emphysema Paternal Uncle   . Heart disease Maternal Grandfather   . Esophageal cancer Brother     Reviw of Systems:  Reviewed in the HPI.  All other systems are negative.  Physical Exam: Blood pressure 140/88, pulse 76, height 5\' 3"  (1.6 m), weight 230 lb 6.4 oz (104.509 kg). General: Well developed, well nourished, in no acute distress.  Head: Normocephalic, atraumatic, sclera non-icteric, mucus membranes are moist,   Neck: Supple. Negative for carotid bruits. JVD not elevated.  Lungs: Clear bilaterally to auscultation without wheezes, rales, or rhonchi. Breathing is unlabored.  Heart: RRR with S1 S2. No murmurs, rubs, or gallops  Abdomen: Soft, non-tender, non-distended with normoactive bowel sounds. No hepatomegaly. No rebound/guarding. No obvious abdominal masses.  Msk:  Strength and tone appear normal for age.  Extremities: No clubbing or cyanosis. No edema.  Distal pedal pulses are 2+ and equal bilaterally.  She has extremely long fingernails  Neuro: Alert and oriented X 3. Moves all extremities spontaneously.  Psych:  Responds to questions appropriately with a normal affect.  ECG: .  Assessment / Plan:

## 2012-09-04 NOTE — Assessment & Plan Note (Signed)
She remained stable from a cardiac standpoint. She's not having episodes of angina. She  Continue with her current medications.

## 2012-09-05 NOTE — Telephone Encounter (Signed)
Called spoke with patient [facility] has already brought the O2 She feels "much better"  Patient denies any questions/conerns at this time Will sign off

## 2012-09-21 ENCOUNTER — Other Ambulatory Visit: Payer: Self-pay | Admitting: Physician Assistant

## 2012-09-25 ENCOUNTER — Other Ambulatory Visit: Payer: Self-pay | Admitting: Physician Assistant

## 2012-09-25 ENCOUNTER — Telehealth: Payer: Self-pay | Admitting: Critical Care Medicine

## 2012-09-25 MED ORDER — FLUTICASONE FUROATE-VILANTEROL 100-25 MCG/INH IN AEPB: 1.0000 | INHALATION_SPRAY | Freq: Every day | RESPIRATORY_TRACT | Status: DC

## 2012-09-25 NOTE — Telephone Encounter (Signed)
Samples are up front for pick up. Pt is aware. 

## 2012-09-26 ENCOUNTER — Other Ambulatory Visit: Payer: Self-pay | Admitting: *Deleted

## 2012-10-02 ENCOUNTER — Other Ambulatory Visit: Payer: Self-pay | Admitting: Physician Assistant

## 2012-10-23 ENCOUNTER — Other Ambulatory Visit: Payer: Self-pay | Admitting: Critical Care Medicine

## 2012-10-30 ENCOUNTER — Other Ambulatory Visit: Payer: Self-pay | Admitting: Nurse Practitioner

## 2012-10-30 DIAGNOSIS — R109 Unspecified abdominal pain: Secondary | ICD-10-CM

## 2012-11-02 ENCOUNTER — Ambulatory Visit
Admission: RE | Admit: 2012-11-02 | Discharge: 2012-11-02 | Disposition: A | Discharge status: Home / Self Care | Payer: Medicare Other | Source: Ambulatory Visit | Attending: Nurse Practitioner | Admitting: Nurse Practitioner

## 2012-11-02 DIAGNOSIS — R109 Unspecified abdominal pain: Secondary | ICD-10-CM

## 2012-11-02 MED ORDER — IOHEXOL 300 MG/ML  SOLN
100.0000 mL | Freq: Once | INTRAMUSCULAR | Status: AC | PRN
  Administered 2012-11-02: 100 mL via INTRAVENOUS

## 2012-11-17 ENCOUNTER — Telehealth: Payer: Self-pay | Admitting: Cardiovascular Disease

## 2012-11-17 DIAGNOSIS — I5032 Chronic diastolic (congestive) heart failure: Secondary | ICD-10-CM

## 2012-11-17 NOTE — Telephone Encounter (Signed)
msg left/ ashley rn for [facility], last echo 10/06/11 EF 60-65%

## 2012-11-17 NOTE — Telephone Encounter (Signed)
Dr Nahser/ problem list does not include CHF. I see it mentioned in PA Weaver's note. Should this DX be added to her problem list?

## 2012-11-17 NOTE — Telephone Encounter (Signed)
New problem    Need clarification     Need ejection fraction %.   Does patient have Heart Failure Diagnosis or not.

## 2012-11-17 NOTE — Telephone Encounter (Signed)
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

## 2012-11-29 ENCOUNTER — Other Ambulatory Visit: Payer: Self-pay | Admitting: Cardiovascular Disease

## 2012-12-13 ENCOUNTER — Telehealth: Payer: Self-pay | Admitting: Critical Care Medicine

## 2012-12-13 MED ORDER — FLUTICASONE FUROATE-VILANTEROL 100-25 MCG/INH IN AEPB: 1.0000 | INHALATION_SPRAY | Freq: Every day | RESPIRATORY_TRACT | Status: DC

## 2012-12-13 NOTE — Telephone Encounter (Signed)
Called, spoke with pt.  Verified she would like Breo samples.  I have placed 2 samples at front for pick up.  Pt aware, was appreciative of this, and voiced no further questions or concerns at this time.

## 2013-02-19 ENCOUNTER — Telehealth: Payer: Self-pay | Admitting: Critical Care Medicine

## 2013-02-19 NOTE — Telephone Encounter (Signed)
Samples are up front for pick up. Pt is aware. 

## 2013-02-22 ENCOUNTER — Other Ambulatory Visit: Payer: Self-pay | Admitting: Critical Care Medicine

## 2013-03-07 ENCOUNTER — Ambulatory Visit (INDEPENDENT_AMBULATORY_CARE_PROVIDER_SITE_OTHER): Payer: Medicare Other | Admitting: Cardiovascular Disease

## 2013-03-07 ENCOUNTER — Encounter: Payer: Self-pay | Admitting: Cardiovascular Disease

## 2013-03-07 VITALS — BP 153/83 | HR 105 | Ht 63.0 in | Wt 223.0 lb

## 2013-03-07 DIAGNOSIS — I5032 Chronic diastolic (congestive) heart failure: Secondary | ICD-10-CM

## 2013-03-07 DIAGNOSIS — I2510 Atherosclerotic heart disease of native coronary artery without angina pectoris: Secondary | ICD-10-CM

## 2013-03-07 DIAGNOSIS — R079 Chest pain, unspecified: Secondary | ICD-10-CM

## 2013-03-07 DIAGNOSIS — I509 Heart failure, unspecified: Secondary | ICD-10-CM

## 2013-03-07 MED ORDER — DILTIAZEM HCL ER COATED BEADS 240 MG PO CP24: 240.0000 mg | ORAL_CAPSULE | Freq: Every day | ORAL | Status: DC

## 2013-03-07 NOTE — Assessment & Plan Note (Signed)
She has persistent episodes of discomfort. She's had these pains for the past several years. Her last stress Myoview study was in 2012. We will repeat a Lexiscan myoview.  Her symptoms may be just due to her ashtma / COPD.  She has known severe obstructive pulmonary disease.

## 2013-03-07 NOTE — Progress Notes (Signed)
[name] Date of Birth  Aug 15, 1940 [facility]  [id number]    [place]   [place], [place]  [address]    [place], [place]  [address] [phone]  Fax  [phone]  [phone]  Fax [phone]  Problem list: 1. Coronary artery disease-status post CABG, the saphenous vein graft to the right coronary artery is severely diseased but she has good flow to the distal right coronary artery from the native circulation. 2. Diabetes mellitus 3. Hypertension 4. Hyperlipidemia 5. COPD: June, 2013 - FEV1 equals 0.89 L which is 48% of predicted value. Her DLCO is also markedly depressed. 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   History of Present Illness:  [name] is a 74 y.o. female with the above noted hx.  She continues to have episodes of sharp pain ( few seconds)  followed by a burning pain ( last 6-7 minutes).  She usually does not take NTG because it causes a headache.  She was started on Lasix earlier this year. She seems to be doing much better on Lasix. She's had only function test which revealed severe obstructive lung disease with a good initial response to bronchodilators.  She is scheduled to have surgery all right knee. She's here today for preoperative evaluation prior to having the surgery.  September 04, 2012:  [name] is seen today for a follow up visit.  She saw Richardson Dopp in April and was started on bystolic.  She has tolerated that fairly well.    She has some episodes of CP that are constant.    She is very stable.   03/01/2013:  She continues to have significant CP.  Still short of breath.  She has no energy.  Just does not feel well in general.    She does not get much sleep - perhaps 3 hours a night.   Current Outpatient Prescriptions on File Prior to Visit  Medication Sig Dispense Refill  . albuterol (PROVENTIL HFA;VENTOLIN HFA) 108 (90 BASE) MCG/ACT inhaler  Inhale 2 puffs into the lungs every 6 (six) hours as needed. For shortness of breath.  1 Inhaler  0  . albuterol (PROVENTIL) (2.5 MG/3ML) 0.083% nebulizer solution Take 2.5 mg by nebulization every 6 (six) hours as needed. For shortness of breath.      . ALPRAZolam (XANAX) 0.25 MG tablet Take 0.25 mg by mouth 2 (two) times daily as needed.       Marland Kitchen atorvastatin (LIPITOR) 40 MG tablet Take 40 mg by mouth every 30 (thirty) days. Takes at night      . beclomethasone (QVAR) 80 MCG/ACT inhaler Inhale 2 puffs into the lungs 2 (two) times daily.  1 Inhaler  12  . Calcium & Magnesium Carbonates (MYLANTA PO) Take 1 tablet by mouth daily as needed. For indigestion.      . Cholecalciferol (VITAMIN D) 2000 UNITS tablet Take 2,000 Units by mouth daily.       . clopidogrel (PLAVIX) 75 MG tablet Take 75 mg by mouth every morning.      Marland Kitchen doxazosin (CARDURA) 8 MG tablet TAKE 1 TABLET BY MOUTH EVERY DAY AT BEDTIME  30 tablet  6  . famotidine (PEPCID) 20 MG tablet TAKE 1 TABLET BY MOUTH EVERY DAY  30 tablet  3  . Fluticasone Furoate-Vilanterol (BREO ELLIPTA) 100-25 MCG/INH AEPB Inhale 1 puff into the lungs daily.  28 each  0  .  HYDROmorphone (DILAUDID) 2 MG tablet Take 1 tablet by mouth Once daily as needed.      . insulin lispro protamine-insulin lispro (HUMALOG 75/25) (75-25) 100 UNIT/ML SUSP Inject 20-45 Units into the skin 2 (two) times daily with a meal. 20 in am, 45 at dinner      . losartan (COZAAR) 50 MG tablet Take 50 mg by mouth every morning.      . methocarbamol (ROBAXIN) 500 MG tablet Take 1 tablet by mouth 2 (two) times daily.       . Multiple Vitamin (MULTIVITAMIN WITH MINERALS) TABS Take 1 tablet by mouth daily.      . nitroGLYCERIN (NITROSTAT) 0.4 MG SL tablet Place 0.4 mg under the tongue every 5 (five) minutes x 3 doses as needed. For chest pain.      Marland Kitchen omeprazole (PRILOSEC) 20 MG capsule Take 1 capsule (20 mg total) by mouth daily as needed. For indigestion.  30 capsule  6  . ONE TOUCH ULTRA TEST  test strip 3-4 times daily as directed      . potassium chloride SA (K-DUR,KLOR-CON) 20 MEQ tablet TAKE 1 TABLET BY MOUTH EVERY DAY  30 tablet  6  . sitaGLIPtin (JANUVIA) 100 MG tablet Take 100 mg by mouth every morning.       . chlorpheniramine (CHLOR-TRIMETON) 4 MG tablet Take 2 tablets (8 mg total) by mouth at bedtime.  30 tablet  6  . potassium chloride SA (K-DUR,KLOR-CON) 20 MEQ tablet Take 20 mEq by mouth 2 (two) times daily.       No current facility-administered medications on file prior to visit.    Allergies  Allergen Reactions  . Amlodipine   . Banana Nausea And Vomiting    Stomach pumped  . Codeine     Hallucinate, loose identity and don't know who I am  . Morphine Other (See Comments)    Can not function, it immobilizes me   . Sulfonamide Derivatives Swelling    Past Medical History  Diagnosis Date  . Coronary artery disease   . Diabetes mellitus   . Hypertension   . Hyperlipidemia   . Shoulder injury   . Chest pain   . Acute myocardial infarction, unspecified site, episode of care unspecified   . Unspecified asthma(493.90)   . PONV (postoperative nausea and vomiting)   . Anxiety   . GERD (gastroesophageal reflux disease)     Past Surgical History  Procedure Laterality Date  . Cardiac catheterization  07/23/2009    EF 60%  . Cardiac catheterization  10/11/2008  . Cardiac catheterization  03/01/2007    EF 75-80%  . Coronary artery bypass graft      SEVERELY DISEASED SAPHENOUS VEIN GRAFT TO THE RIGHT CORONARY ARTERY BUT WITH FAIRLY WELL PRESERVED FLOW TO THE DISTAL RIGHT CORONARY ARTERY FROM THE NATIVE CIRCULATION-RESTART  CATH IN JUNE 2000, REVEALS MILD/MODERATE  CAD WITH GOOD FLOW DOWN HER LAD  . US echocardiography  03/08/2008    EF 55-60%  . Cardiovascular stress test  11/15/2007    EF 60%  . Rotator cuff repair      right and left  . Tumor removed kidney    . Tonsillectomy      age 70  . Abdominal hysterectomy    . Appendectomy      came out with  Hysterectomy  . Eye surgery      bilateral cataract surgery with lens implant    History  Smoking status  . Former Smoker --  0.50 packs/day for 25 years  . Types: Cigarettes  . Quit date: 08/20/1978  Smokeless tobacco  . Never Used    History  Alcohol Use No    Family History  Problem Relation Age of Onset  . Emphysema Paternal Uncle   . Heart disease Maternal Grandfather   . Esophageal cancer Brother     Reviw of Systems:  Reviewed in the HPI.  All other systems are negative.  Physical Exam: Blood pressure 153/83, pulse 105, height 5\' 3"  (1.6 m), weight 223 lb (101.152 kg).  Filed Weights   03/07/13 1101  Weight: 223 lb (101.152 kg)    General: Well developed, well nourished, in no acute distress. Head: Normocephalic, atraumatic, sclera non-icteric, mucus membranes are moist,  Neck: Supple. Negative for carotid bruits. JVD not elevated. Lungs: mild wheezing bilaterally.  Heart: RRR with S1 S2. No murmurs, rubs, or gallops Abdomen: Soft, non-tender, non-distended with normoactive bowel sounds. No hepatomegaly. No rebound/guarding. No obvious abdominal masses. Msk:  Strength and tone appear normal for age. Extremities: No clubbing or cyanosis. No edema.  Distal pedal pulses are 2+ and equal bilaterally.  She has extremely long fingernails Neuro: Alert and oriented X 3. Moves all extremities spontaneously. Psych:  Responds to questions appropriately with a normal affect.  ECG: .  Assessment / Plan:

## 2013-03-07 NOTE — Patient Instructions (Addendum)
Your physician has requested that you have a lexiscan myoview.  Please follow instruction sheet, as given.   Your physician has recommended you make the following change in your medication:  START DILTIAZEM CD 240 MG DAILY  Your physician wants you to follow-up in: 3 MONTHS You will receive a reminder letter in the mail two months in advance. If you don't receive a letter, please call our office to schedule the follow-up appointment.

## 2013-03-07 NOTE — Assessment & Plan Note (Addendum)
She is not having any additional shortness breath. She does have chronic asthma.  I do not think she would tolerate any beta blockers.  We'll add Diltiazem 240  DC mg a day.

## 2013-03-21 ENCOUNTER — Ambulatory Visit (HOSPITAL_COMMUNITY): Payer: Medicare Other | Attending: Cardiology | Admitting: Radiology

## 2013-03-21 VITALS — BP 137/72 | Ht 63.0 in | Wt 224.0 lb

## 2013-03-21 DIAGNOSIS — I2510 Atherosclerotic heart disease of native coronary artery without angina pectoris: Secondary | ICD-10-CM

## 2013-03-21 DIAGNOSIS — Z794 Long term (current) use of insulin: Secondary | ICD-10-CM | POA: Insufficient documentation

## 2013-03-21 DIAGNOSIS — Z8249 Family history of ischemic heart disease and other diseases of the circulatory system: Secondary | ICD-10-CM | POA: Insufficient documentation

## 2013-03-21 DIAGNOSIS — I779 Disorder of arteries and arterioles, unspecified: Secondary | ICD-10-CM | POA: Insufficient documentation

## 2013-03-21 DIAGNOSIS — R079 Chest pain, unspecified: Secondary | ICD-10-CM | POA: Insufficient documentation

## 2013-03-21 DIAGNOSIS — E785 Hyperlipidemia, unspecified: Secondary | ICD-10-CM | POA: Insufficient documentation

## 2013-03-21 DIAGNOSIS — R1084 Generalized abdominal pain: Secondary | ICD-10-CM

## 2013-03-21 DIAGNOSIS — I252 Old myocardial infarction: Secondary | ICD-10-CM | POA: Insufficient documentation

## 2013-03-21 DIAGNOSIS — J449 Chronic obstructive pulmonary disease, unspecified: Secondary | ICD-10-CM | POA: Insufficient documentation

## 2013-03-21 DIAGNOSIS — I10 Essential (primary) hypertension: Secondary | ICD-10-CM | POA: Insufficient documentation

## 2013-03-21 DIAGNOSIS — E119 Type 2 diabetes mellitus without complications: Secondary | ICD-10-CM | POA: Insufficient documentation

## 2013-03-21 DIAGNOSIS — J4489 Other specified chronic obstructive pulmonary disease: Secondary | ICD-10-CM | POA: Insufficient documentation

## 2013-03-21 DIAGNOSIS — Z951 Presence of aortocoronary bypass graft: Secondary | ICD-10-CM | POA: Insufficient documentation

## 2013-03-21 DIAGNOSIS — Z87891 Personal history of nicotine dependence: Secondary | ICD-10-CM | POA: Insufficient documentation

## 2013-03-21 DIAGNOSIS — R0989 Other specified symptoms and signs involving the circulatory and respiratory systems: Secondary | ICD-10-CM | POA: Insufficient documentation

## 2013-03-21 DIAGNOSIS — I509 Heart failure, unspecified: Secondary | ICD-10-CM | POA: Insufficient documentation

## 2013-03-21 DIAGNOSIS — R0609 Other forms of dyspnea: Secondary | ICD-10-CM | POA: Insufficient documentation

## 2013-03-21 DIAGNOSIS — I4510 Unspecified right bundle-branch block: Secondary | ICD-10-CM | POA: Insufficient documentation

## 2013-03-21 DIAGNOSIS — R0602 Shortness of breath: Secondary | ICD-10-CM

## 2013-03-21 MED ORDER — TECHNETIUM TC 99M SESTAMIBI GENERIC - CARDIOLITE
10.8000 | Freq: Once | INTRAVENOUS | Status: AC | PRN
  Administered 2013-03-21: 11 via INTRAVENOUS

## 2013-03-21 MED ORDER — AMINOPHYLLINE 25 MG/ML IV SOLN
75.0000 mg | Freq: Once | INTRAVENOUS | Status: AC
  Administered 2013-03-21: 75 mg via INTRAVENOUS

## 2013-03-21 MED ORDER — REGADENOSON 0.4 MG/5ML IV SOLN
0.4000 mg | Freq: Once | INTRAVENOUS | Status: AC
  Administered 2013-03-21: 0.4 mg via INTRAVENOUS

## 2013-03-21 MED ORDER — TECHNETIUM TC 99M SESTAMIBI GENERIC - CARDIOLITE
33.0000 | Freq: Once | INTRAVENOUS | Status: AC | PRN
  Administered 2013-03-21: 33 via INTRAVENOUS

## 2013-03-21 NOTE — Progress Notes (Signed)
[facility] [address], [place] [address] [phone]    Cardiology Nuclear Med Study  [name] is a 73 y.o. female     MRN : [id number]     DOB: [birth year]  Procedure Date: 03/21/2013  Nuclear Med Background Indication for Stress Test:  Evaluation for Ischemia, Surgical Clearance- Knee Surgery Latanya Maudlin, and Graft Patency History:  Asthma, COPD and CAD,MI-Cath-CABG-CHF,12/3/12MPI: EF: 72% 2013 ECHO: EF: 60-65% Cardiac Risk Factors: Carotid Disease, Family History - CAD, History of Smoking, Hypertension, IDDM Type 2, Lipids and RBBB  Symptoms:  Chest Pain, DOE and SOB   Nuclear Pre-Procedure Caffeine/Decaff Intake:  None NPO After: 8:00pm   Lungs:  clear O2 Sat: -% on room air. IV 0.9% NS with Angio Cath:  22g  IV Site: R Antecubital  IV Started by:  Crissie Figures, RN  Chest Size (in):  38 Cup Size: B  Height: 5\' 3"  (1.6 m)  Weight:  224 lb (101.606 kg)  BMI:  Body mass index is 39.69 kg/(m^2). Tech Comments:  CGB @ 12n= 144. Aminophylline 75 mg IV given for symptoms. All were resolved within minutes after administration. S.Williams EMTP    Nuclear Med Study 1 or 2 day study: 1 day  Stress Test Type:  Lexiscan  Reading MD: N/A  Order Authorizing Provider:  Mertie Moores, MD  Resting Radionuclide: Technetium 28m Sestamibi  Resting Radionuclide Dose: 11.0 mCi   Stress Radionuclide:  Technetium 85m Sestamibi  Stress Radionuclide Dose: 33.0 mCi           Stress Protocol Rest HR: 81 Stress HR: 103  Rest BP: 137/72 Stress BP: 138/63  Exercise Time (min): n/a METS: n/a   Predicted Max HR: 146 bpm % Max HR: 70.55 bpm Rate Pressure Product: 14214   Dose of Adenosine (mg):  n/a Dose of Lexiscan: 0.4 mg  Dose of Atropine (mg): n/a Dose of Dobutamine: n/a mcg/kg/min (at max HR)  Stress Test Technologist: Perrin Maltese, EMT-P  Nuclear Technologist:  Annye Rusk, CNMT     Rest Procedure:  Myocardial perfusion imaging was  performed at rest 45 minutes following the intravenous administration of Technetium 59m Sestamibi. Rest ECG: Normal sinus rhythm with right bundle branch block  Stress Procedure:  The patient received IV Lexiscan 0.4 mg over 15-seconds.  Technetium 32m Sestamibi injected at 30-seconds. This patient had fatigue, sob, abdominal pain, and chest pressure with the Lexiscan injection.  Quantitative spect images were obtained after a 45 minute delay. Stress ECG: No significant change from baseline ECG  QPS Raw Data Images:  There is no excess motion. Stress Images:  There are spotty areas of mild decreased uptake with a random distribution. This is seen on the quantitative analysis. I am not convinced that this represents a significant abnormality. Rest Images:  Rest images are similar to the stress images. Subtraction (SDS):  No evidence of ischemia. Transient Ischemic Dilatation (Normal <1.22):  0.89 Lung/Heart Ratio (Normal <0.45):  0.24  Quantitative Gated Spect Images QGS EDV:  91 ml QGS ESV:  30 ml  Impression Exercise Capacity:  Lexiscan with no exercise. BP Response:  Normal blood pressure response. Clinical Symptoms:  Fatigue and shortness of breath ECG Impression:  No significant ST segment change suggestive of ischemia. Comparison with Prior Nuclear Study: No images to compare  Overall Impression:  This is a low risk scan. Wall motion is normal. Visually, the tomographic images do not reveal any significant abnormalities. The quantitative analysis  questions spotty areas of decreased uptake that are random in distribution. I am not convinced that this represents a significant abnormality. There does not appear to be any significant scar or ischemia.  LV Ejection Fraction: 68%.  LV Wall Motion:  Normal Wall Motion.  Dola Argyle, MD

## 2013-04-24 ENCOUNTER — Other Ambulatory Visit: Payer: Self-pay | Admitting: Cardiovascular Disease

## 2013-05-01 ENCOUNTER — Telehealth: Payer: Self-pay | Admitting: Critical Care Medicine

## 2013-05-01 NOTE — Telephone Encounter (Signed)
lmomtcb x1 for pt 

## 2013-05-02 MED ORDER — FLUTICASONE FUROATE-VILANTEROL 100-25 MCG/INH IN AEPB: 1.0000 | INHALATION_SPRAY | Freq: Every day | RESPIRATORY_TRACT | Status: DC

## 2013-05-02 NOTE — Telephone Encounter (Signed)
[id number]. Jennifer Castillo, CMA  

## 2013-05-02 NOTE — Telephone Encounter (Signed)
I spoke with the pt and advised that I have left samples at the front for her. Also pt is due for an appt so this was set as well. Pt then stated that she received a bill for $90 for her oxygen and she cannot afford this. She states she called and asked them to pick up the oxygen and they told her they could not due this without an order form doctor. I advised I will speak with [facility] and see if there was anything that could be worked out for her bill before we ask the doctor to d/c the oxygen since she needs this. I called and spoke with Maudie Mercury at [facility] and she is going to call pt and go over options. [place] Bing, CMA

## 2013-05-02 NOTE — Telephone Encounter (Signed)
lmomtcb x1 for pt 

## 2013-05-02 NOTE — Telephone Encounter (Signed)
Returning call.

## 2013-05-16 ENCOUNTER — Other Ambulatory Visit: Payer: Self-pay | Admitting: *Deleted

## 2013-05-16 MED ORDER — POTASSIUM CHLORIDE CRYS ER 20 MEQ PO TBCR: EXTENDED_RELEASE_TABLET | ORAL | Status: DC

## 2013-06-01 ENCOUNTER — Ambulatory Visit: Payer: Medicare Other | Admitting: Critical Care Medicine

## 2013-06-05 ENCOUNTER — Encounter: Payer: Self-pay | Admitting: Critical Care Medicine

## 2013-06-05 ENCOUNTER — Ambulatory Visit (INDEPENDENT_AMBULATORY_CARE_PROVIDER_SITE_OTHER): Payer: Medicare Other | Admitting: Critical Care Medicine

## 2013-06-05 ENCOUNTER — Encounter (INDEPENDENT_AMBULATORY_CARE_PROVIDER_SITE_OTHER): Payer: Self-pay

## 2013-06-05 VITALS — BP 132/70 | HR 114 | Temp 97.9°F | Ht 62.5 in | Wt 221.2 lb

## 2013-06-05 DIAGNOSIS — J45909 Unspecified asthma, uncomplicated: Secondary | ICD-10-CM

## 2013-06-05 MED ORDER — METHYLPREDNISOLONE ACETATE 80 MG/ML IJ SUSP
120.0000 mg | Freq: Once | INTRAMUSCULAR | Status: AC
  Administered 2013-06-05: 120 mg via INTRAMUSCULAR

## 2013-06-05 MED ORDER — FLUTICASONE FUROATE-VILANTEROL 100-25 MCG/INH IN AEPB: 1.0000 | INHALATION_SPRAY | Freq: Every day | RESPIRATORY_TRACT | Status: DC

## 2013-06-05 NOTE — Patient Instructions (Signed)
A depomedrol 120mg  IM was given Resume Breo one puff daily Stay on Qvar twice daily Return 3 months

## 2013-06-05 NOTE — Progress Notes (Signed)
Subjective:    Patient ID: [name], female    DOB: 03-31-1940, 73 y.o.   MRN: [id number]  HPI 73 y.o.  with known hx of asthma, former smoker .  PFT >08/04/2011  PFTs:  FeV1 48%  FVC 60%  25% improvement with BD TLC 80%  DLCO 38%  06/05/2013 Chief Complaint  Patient presents with  . Follow-up    Breathing has worsened over the past couple of months.  Has increased DOE with minimal activity, wheezing, chest tightness, and nonprod cough x 1 wk.    Pt is worse with dyspnea.  Pt notes some cough, notes some wheezing.  Using SABA more often, several times per day.  Notes some chest tightness  PUL ASTHMA HISTORY 06/05/2013 03/06/2012 12/22/2011  Symptoms Daily Daily Daily  Nighttime awakenings >1/wk but not nightly Often--7/wk >1/wk but not nightly  Interference with activity Some limitations Some limitations Some limitations  SABA use Several times/day Several times/day > 2 days/wk--not > 1 x/day  Exacerbations requiring oral steroids 0-1 / year 2 or more / year 0-1 / year   Past Medical History  Diagnosis Date  . Coronary artery disease   . Diabetes mellitus   . Hypertension   . Hyperlipidemia   . Shoulder injury   . Chest pain   . Acute myocardial infarction, unspecified site, episode of care unspecified   . Unspecified asthma(493.90)   . PONV (postoperative nausea and vomiting)   . Anxiety   . GERD (gastroesophageal reflux disease)      Family History  Problem Relation Age of Onset  . Emphysema Paternal Uncle   . Heart disease Maternal Grandfather   . Esophageal cancer Brother      History   Social History  . Marital Status: Widowed    Spouse Name: N/A    Number of Children: 4  . Years of Education: N/A   Occupational History  .     Social History Main Topics  . Smoking status: Former Smoker -- 0.50 packs/day for 25 years    Types: Cigarettes    Quit date: 02/09/1976  . Smokeless tobacco: Never Used  . Alcohol Use: No  . Drug Use: No  . Sexual Activity:  Not on file   Other Topics Concern  . Not on file   Social History Narrative  . No narrative on file     Allergies  Allergen Reactions  . Amlodipine   . Banana Nausea And Vomiting    Stomach pumped  . Codeine     Hallucinate, loose identity and don't know who I am  . Morphine Other (See Comments)    Can not function, it immobilizes me   . Sulfonamide Derivatives Swelling     Outpatient Prescriptions Prior to Visit  Medication Sig Dispense Refill  . albuterol (PROVENTIL HFA;VENTOLIN HFA) 108 (90 BASE) MCG/ACT inhaler Inhale 2 puffs into the lungs every 6 (six) hours as needed. For shortness of breath.  1 Inhaler  0  . albuterol (PROVENTIL) (2.5 MG/3ML) 0.083% nebulizer solution Take 2.5 mg by nebulization every 6 (six) hours as needed. For shortness of breath.      . ALPRAZolam (XANAX) 0.25 MG tablet Take 0.25 mg by mouth 2 (two) times daily as needed.       . beclomethasone (QVAR) 80 MCG/ACT inhaler Inhale 2 puffs into the lungs 2 (two) times daily.  1 Inhaler  12  . Calcium & Magnesium Carbonates (MYLANTA PO) Take 1 tablet by mouth daily as  needed. For indigestion.      . chlorpheniramine (CHLOR-TRIMETON) 4 MG tablet Take 2 tablets (8 mg total) by mouth at bedtime.  30 tablet  6  . Cholecalciferol (VITAMIN D) 2000 UNITS tablet Take 2,000 Units by mouth daily.       . clopidogrel (PLAVIX) 75 MG tablet Take 75 mg by mouth every morning.      . diltiazem (CARDIZEM CD) 240 MG 24 hr capsule Take 1 capsule (240 mg total) by mouth daily.  30 capsule  6  . doxazosin (CARDURA) 8 MG tablet TAKE 1 TABLET BY MOUTH EVERY DAY AT BEDTIME  30 tablet  6  . famotidine (PEPCID) 20 MG tablet TAKE 1 TABLET BY MOUTH EVERY DAY  30 tablet  3  . furosemide (LASIX) 40 MG tablet TAKE 1 TABLET BY MOUTH TWICE A MONTH      . HYDROmorphone (DILAUDID) 2 MG tablet Take 1 tablet by mouth Once daily as needed.      . insulin lispro protamine-insulin lispro (HUMALOG 75/25) (75-25) 100 UNIT/ML SUSP Inject 20-45  Units into the skin 2 (two) times daily with a meal. 20 in am, 45 at dinner      . losartan (COZAAR) 50 MG tablet Take 50 mg by mouth every morning.      . methocarbamol (ROBAXIN) 500 MG tablet Take 1 tablet by mouth 2 (two) times daily.       . Multiple Vitamin (MULTIVITAMIN WITH MINERALS) TABS Take 1 tablet by mouth daily.      . nitroGLYCERIN (NITROSTAT) 0.4 MG SL tablet Place 0.4 mg under the tongue every 5 (five) minutes x 3 doses as needed. For chest pain.      Marland Kitchen omeprazole (PRILOSEC) 20 MG capsule TAKE ONE CAPSULE BY MOUTH EVERY DAY AS NEEDED INDIGESTION  30 capsule  0  . ONE TOUCH ULTRA TEST test strip 3-4 times daily as directed      . potassium chloride SA (K-DUR,KLOR-CON) 20 MEQ tablet TAKE 1 TABLET BY MOUTH EVERY DAY  30 tablet  3  . sitaGLIPtin (JANUVIA) 100 MG tablet Take 100 mg by mouth every morning.       Marland Kitchen atorvastatin (LIPITOR) 40 MG tablet Take 40 mg by mouth every 30 (thirty) days. Takes at night      . Fluticasone Furoate-Vilanterol (BREO ELLIPTA) 100-25 MCG/INH AEPB Inhale 1 puff into the lungs daily.  28 each  0   No facility-administered medications prior to visit.      Review of Systems  Constitutional:   No  weight loss, night sweats,  Fevers, chills,  ++fatigue, lassitude.+daytime sleepiness  HEENT:   No headaches,  Difficulty swallowing,  Tooth/dental problems,  Sore throat,                No sneezing, itching, ear ache, nasal congestion, post nasal drip,   CV:  No chest pain,    , anasarca, dizziness, palpitations  GI  No heartburn, indigestion, abdominal pain, nausea, vomiting, diarrhea, change in bowel habits, loss of appetite  Resp:    No coughing up of blood.  No change in color of mucus.   .  No chest wall deformity  Skin: no rash or lesions.  GU: no dysuria, change in color of urine, no urgency or frequency.  No flank pain.  MS:  No joint pain or swelling.  No decreased range of motion.  No back pain.  Psych:  No change in mood or affect. No  depression or anxiety.  No memory loss.     Objective:   Physical Exam  Filed Vitals:   06/05/13 1350  BP: 132/70  Pulse: 114  Temp: 97.9 F (36.6 C)  TempSrc: Oral  Height: 5' 2.5" (1.588 m)  Weight: 221 lb 3.2 oz (100.336 kg)  SpO2: 95%    Gen: Pleasant, well obese , in no distress,  normal affect  ENT: No lesions,  mouth clear,  oropharynx clear, no postnasal drip  Neck: No JVD, no TMG, no carotid bruits  Lungs: No use of accessory muscles, no dullness to percussion, coarse BS   Cardiovascular: RRR, heart sounds normal, no murmur or gallops, tr  peripheral edema  Abdomen: soft and NT, no HSM,  BS normal  Musculoskeletal: No deformities, no cyanosis or clubbing  Neuro: alert, non focal  Skin: Warm, no lesions or rashes  No results found.        Assessment & Plan:   Severe persistent asthma Severe persistent asthma with lower airway inflammation Plan A depomedrol 120mg  IM was given Resume Breo one puff daily Stay on Qvar twice daily Return 3 months     Updated Medication List Outpatient Encounter Prescriptions as of 06/05/2013  Medication Sig  . albuterol (PROVENTIL HFA;VENTOLIN HFA) 108 (90 BASE) MCG/ACT inhaler Inhale 2 puffs into the lungs every 6 (six) hours as needed. For shortness of breath.  Marland Kitchen albuterol (PROVENTIL) (2.5 MG/3ML) 0.083% nebulizer solution Take 2.5 mg by nebulization every 6 (six) hours as needed. For shortness of breath.  . ALPRAZolam (XANAX) 0.25 MG tablet Take 0.25 mg by mouth 2 (two) times daily as needed.   . beclomethasone (QVAR) 80 MCG/ACT inhaler Inhale 2 puffs into the lungs 2 (two) times daily.  . Calcium & Magnesium Carbonates (MYLANTA PO) Take 1 tablet by mouth daily as needed. For indigestion.  . chlorpheniramine (CHLOR-TRIMETON) 4 MG tablet Take 2 tablets (8 mg total) by mouth at bedtime.  . Cholecalciferol (VITAMIN D) 2000 UNITS tablet Take 2,000 Units by mouth daily.   . clopidogrel (PLAVIX) 75 MG tablet Take 75 mg  by mouth every morning.  . Coenzyme Q10 (CO Q-10 PO) Take by mouth every other day.  . diltiazem (CARDIZEM CD) 240 MG 24 hr capsule Take 1 capsule (240 mg total) by mouth daily.  Marland Kitchen doxazosin (CARDURA) 8 MG tablet TAKE 1 TABLET BY MOUTH EVERY DAY AT BEDTIME  . famotidine (PEPCID) 20 MG tablet TAKE 1 TABLET BY MOUTH EVERY DAY  . furosemide (LASIX) 40 MG tablet TAKE 1 TABLET BY MOUTH TWICE A MONTH  . HYDROmorphone (DILAUDID) 2 MG tablet Take 1 tablet by mouth Once daily as needed.  . insulin lispro protamine-insulin lispro (HUMALOG 75/25) (75-25) 100 UNIT/ML SUSP Inject 20-45 Units into the skin 2 (two) times daily with a meal. 20 in am, 45 at dinner  . losartan (COZAAR) 50 MG tablet Take 50 mg by mouth every morning.  . meclizine (ANTIVERT) 25 MG tablet as needed.  . methocarbamol (ROBAXIN) 500 MG tablet Take 1 tablet by mouth 2 (two) times daily.   . Multiple Vitamin (MULTIVITAMIN WITH MINERALS) TABS Take 1 tablet by mouth daily.  . nitroGLYCERIN (NITROSTAT) 0.4 MG SL tablet Place 0.4 mg under the tongue every 5 (five) minutes x 3 doses as needed. For chest pain.  Marland Kitchen omeprazole (PRILOSEC) 20 MG capsule TAKE ONE CAPSULE BY MOUTH EVERY DAY AS NEEDED INDIGESTION  . ONE TOUCH ULTRA TEST test strip 3-4 times daily as directed  . potassium chloride SA (K-DUR,KLOR-CON) 20  MEQ tablet TAKE 1 TABLET BY MOUTH EVERY DAY  . sitaGLIPtin (JANUVIA) 100 MG tablet Take 100 mg by mouth every morning.   . Fluticasone Furoate-Vilanterol (BREO ELLIPTA) 100-25 MCG/INH AEPB Inhale 1 puff into the lungs daily.  . [DISCONTINUED] atorvastatin (LIPITOR) 40 MG tablet Take 40 mg by mouth every 30 (thirty) days. Takes at night  . [DISCONTINUED] Fluticasone Furoate-Vilanterol (BREO ELLIPTA) 100-25 MCG/INH AEPB Inhale 1 puff into the lungs daily.  . [EXPIRED] methylPREDNISolone acetate (DEPO-MEDROL) injection 120 mg

## 2013-06-06 NOTE — Assessment & Plan Note (Signed)
Severe persistent asthma with lower airway inflammation Plan A depomedrol 120mg  IM was given Resume Breo one puff daily Stay on Qvar twice daily Return 3 months

## 2013-06-16 ENCOUNTER — Other Ambulatory Visit: Payer: Self-pay | Admitting: Cardiovascular Disease

## 2013-07-02 ENCOUNTER — Other Ambulatory Visit: Payer: Self-pay | Admitting: Cardiovascular Disease

## 2013-07-04 ENCOUNTER — Other Ambulatory Visit: Payer: Self-pay | Admitting: Critical Care Medicine

## 2013-08-01 ENCOUNTER — Other Ambulatory Visit: Payer: Self-pay | Admitting: Cardiovascular Disease

## 2013-09-06 ENCOUNTER — Other Ambulatory Visit: Payer: Self-pay | Admitting: Cardiovascular Disease

## 2013-09-14 ENCOUNTER — Ambulatory Visit (INDEPENDENT_AMBULATORY_CARE_PROVIDER_SITE_OTHER): Payer: Medicare Other | Admitting: Critical Care Medicine

## 2013-09-14 ENCOUNTER — Encounter: Payer: Self-pay | Admitting: Critical Care Medicine

## 2013-09-14 ENCOUNTER — Encounter (INDEPENDENT_AMBULATORY_CARE_PROVIDER_SITE_OTHER): Payer: Self-pay

## 2013-09-14 VITALS — BP 140/74 | HR 99 | Temp 97.3°F | Ht 63.0 in | Wt 217.2 lb

## 2013-09-14 DIAGNOSIS — J45909 Unspecified asthma, uncomplicated: Secondary | ICD-10-CM

## 2013-09-14 DIAGNOSIS — R0902 Hypoxemia: Secondary | ICD-10-CM

## 2013-09-14 MED ORDER — METHYLPREDNISOLONE ACETATE 80 MG/ML IJ SUSP
120.0000 mg | Freq: Once | INTRAMUSCULAR | Status: AC
  Administered 2013-09-14: 120 mg via INTRAMUSCULAR

## 2013-09-14 MED ORDER — BECLOMETHASONE DIPROPIONATE 80 MCG/ACT IN AERS: 3.0000 | INHALATION_SPRAY | Freq: Two times a day (BID) | RESPIRATORY_TRACT | Status: DC

## 2013-09-14 NOTE — Patient Instructions (Signed)
Increase qvar to three puff twice daily A depomedrol 120mg  injection was given Stay on Breo Humidity will be ordered for oxygen A breathing test was ordered Return 2 months

## 2013-09-14 NOTE — Assessment & Plan Note (Signed)
Severe persistent asthma with exacerbation Pulmonary functions need updating The patient needs improved airway control before can be cleared for planned knee replacement Plan Increase qvar to three puff twice daily A depomedrol 120mg  injection was given Stay on Breo Humidity will be ordered for oxygen  Repeat spirometry

## 2013-09-14 NOTE — Progress Notes (Signed)
Subjective:    Patient ID: [name], female    DOB: 03/27/1940, 73 y.o.   MRN: [id number]  HPI 09/14/2013 Chief Complaint  Patient presents with  . Follow-up    Using rescue inhaler more frequestly x 2 1/2 wks d/t increased SOB, wheezing, and chest tightness.  No cough.  Needs surgery clearance for right knee replacement  with Dr. Wynetta Emery.     the patient is using rescue inhaler more often for the past 2 weeks with increasing shortness of breath wheezing and chest tightness. There is no increased cough noted. There is no fever chills or sweats. The patient notes some nocturnal awakenings.  PUL ASTHMA HISTORY 09/14/2013 06/05/2013 03/06/2012 12/22/2011  Symptoms Throughout the day Daily Daily Daily  Nighttime awakenings 0-2/month >1/wk but not nightly Often--7/wk >1/wk but not nightly  Interference with activity Some limitations Some limitations Some limitations Some limitations  SABA use Daily Several times/day Several times/day > 2 days/wk--not > 1 x/day  Exacerbations requiring oral steroids 0-1 / year 0-1 / year 2 or more / year 0-1 / year     Review of Systems Constitutional:   No  weight loss, night sweats,  Fevers, chills, fatigue, lassitude. HEENT:   No headaches,  Difficulty swallowing,  Tooth/dental problems,  Sore throat,                No sneezing, itching, ear ache, nasal congestion, post nasal drip,   CV:  No chest pain,  Orthopnea, PND, swelling in lower extremities, anasarca, dizziness, palpitations  GI  No heartburn, indigestion, abdominal pain, nausea, vomiting, diarrhea, change in bowel habits, loss of appetite  Resp: Notes  shortness of breath with exertion not  at rest.  No excess mucus, no productive cough,  No non-productive cough,  No coughing up of blood.  No change in color of mucus.  No wheezing.  No chest wall deformity  Skin: no rash or lesions.  GU: no dysuria, change in color of urine, no urgency or frequency.  No flank pain.  MS:  No joint pain or  swelling.  No decreased range of motion.  No back pain.  Psych:  No change in mood or affect. No depression or anxiety.  No memory loss.     Objective:   Physical Exam Filed Vitals:   09/14/13 1013  BP: 140/74  Pulse: 99  Temp: 97.3 F (36.3 C)  TempSrc: Oral  Height: 5\' 3"  (1.6 m)  Weight: 217 lb 3.2 oz (98.521 kg)  SpO2: 95%    Gen: Pleasant, well-nourished, in no distress,  normal affect  ENT: No lesions,  mouth clear,  oropharynx clear, no postnasal drip  Neck: No JVD, no TMG, no carotid bruits  Lungs: No use of accessory muscles, no dullness to percussion, insp exp wheeze   Cardiovascular: RRR, heart sounds normal, no murmur or gallops, no peripheral edema  Abdomen: soft and NT, no HSM,  BS normal  Musculoskeletal: No deformities, no cyanosis or clubbing  Neuro: alert, non focal  Skin: Warm, no lesions or rashes  No results found.        Assessment & Plan:   Severe persistent asthma Severe persistent asthma with exacerbation Pulmonary functions need updating The patient needs improved airway control before can be cleared for planned knee replacement Plan Increase qvar to three puff twice daily A depomedrol 120mg  injection was given Stay on Breo Humidity will be ordered for oxygen  Repeat spirometry   Updated Medication List Outpatient Encounter  Prescriptions as of 09/14/2013  Medication Sig  . albuterol (PROVENTIL HFA;VENTOLIN HFA) 108 (90 BASE) MCG/ACT inhaler Inhale 2 puffs into the lungs every 6 (six) hours as needed. For shortness of breath.  Marland Kitchen albuterol (PROVENTIL) (2.5 MG/3ML) 0.083% nebulizer solution Take 2.5 mg by nebulization every 6 (six) hours as needed. For shortness of breath.  . ALPRAZolam (XANAX) 0.25 MG tablet Take 0.25 mg by mouth 2 (two) times daily as needed.   . beclomethasone (QVAR) 80 MCG/ACT inhaler Inhale 3 puffs into the lungs 2 (two) times daily.  . Calcium & Magnesium Carbonates (MYLANTA PO) Take 1 tablet by mouth daily  as needed. For indigestion.  . chlorpheniramine (CHLOR-TRIMETON) 4 MG tablet Take 2 tablets (8 mg total) by mouth at bedtime.  . Cholecalciferol (VITAMIN D) 2000 UNITS tablet Take 2,000 Units by mouth daily.   . clopidogrel (PLAVIX) 75 MG tablet Take 75 mg by mouth every morning.  . diltiazem (CARDIZEM CD) 240 MG 24 hr capsule Take 1 capsule (240 mg total) by mouth daily.  Marland Kitchen doxazosin (CARDURA) 8 MG tablet TAKE 1 TABLET BY MOUTH EVERY DAY AT BEDTIME  . famotidine (PEPCID) 20 MG tablet TAKE 1 TABLET BY MOUTH EVERY DAY  . Fluticasone Furoate-Vilanterol (BREO ELLIPTA) 100-25 MCG/INH AEPB Inhale 1 puff into the lungs daily.  . furosemide (LASIX) 40 MG tablet TAKE 1 TABLET BY MOUTH TWICE A MONTH  . HYDROmorphone (DILAUDID) 2 MG tablet Take 1 tablet by mouth Once daily as needed.  . insulin lispro protamine-insulin lispro (HUMALOG 75/25) (75-25) 100 UNIT/ML SUSP Inject 20-45 Units into the skin 2 (two) times daily with a meal. 20 in am, 45 at dinner  . losartan (COZAAR) 50 MG tablet Take 50 mg by mouth every morning.  . meclizine (ANTIVERT) 25 MG tablet as needed.  . methocarbamol (ROBAXIN) 500 MG tablet Take 1 tablet by mouth 2 (two) times daily.   . metoprolol succinate (TOPROL-XL) 50 MG 24 hr tablet Take 1 tablet by mouth daily.  . Multiple Vitamin (MULTIVITAMIN WITH MINERALS) TABS Take 1 tablet by mouth daily.  . nitroGLYCERIN (NITROSTAT) 0.4 MG SL tablet Place 0.4 mg under the tongue every 5 (five) minutes x 3 doses as needed. For chest pain.  Marland Kitchen omeprazole (PRILOSEC) 20 MG capsule TAKE ONE CAPSULE BY MOUTH EVERY DAY AS NEEDED FOR INDIGESTION  . ONE TOUCH ULTRA TEST test strip 3-4 times daily as directed  . phentermine (ADIPEX-P) 37.5 MG tablet Take 1 tablet by mouth daily.  . potassium chloride SA (K-DUR,KLOR-CON) 20 MEQ tablet TAKE 1 TABLET BY MOUTH EVERY DAY  . [DISCONTINUED] beclomethasone (QVAR) 80 MCG/ACT inhaler Inhale 2 puffs into the lungs 2 (two) times daily.  . [DISCONTINUED] Coenzyme  Q10 (CO Q-10 PO) Take by mouth every other day.  . [DISCONTINUED] sitaGLIPtin (JANUVIA) 100 MG tablet Take 100 mg by mouth every morning.   . [EXPIRED] methylPREDNISolone acetate (DEPO-MEDROL) injection 120 mg

## 2013-10-09 ENCOUNTER — Other Ambulatory Visit: Payer: Self-pay | Admitting: Cardiovascular Disease

## 2013-11-05 ENCOUNTER — Other Ambulatory Visit: Payer: Self-pay | Admitting: Cardiovascular Disease

## 2013-11-10 ENCOUNTER — Other Ambulatory Visit: Payer: Self-pay | Admitting: Cardiovascular Disease

## 2013-11-12 ENCOUNTER — Ambulatory Visit (INDEPENDENT_AMBULATORY_CARE_PROVIDER_SITE_OTHER): Payer: Medicare Other | Admitting: Critical Care Medicine

## 2013-11-12 ENCOUNTER — Ambulatory Visit (HOSPITAL_COMMUNITY)
Admission: RE | Admit: 2013-11-12 | Discharge: 2013-11-12 | Disposition: A | Discharge status: Home / Self Care | Payer: Medicare Other | Source: Ambulatory Visit | Attending: Critical Care Medicine | Admitting: Critical Care Medicine

## 2013-11-12 ENCOUNTER — Encounter: Payer: Self-pay | Admitting: Critical Care Medicine

## 2013-11-12 VITALS — BP 120/68 | HR 92 | Temp 98.1°F | Ht 63.0 in | Wt 207.8 lb

## 2013-11-12 DIAGNOSIS — Z720 Tobacco use: Secondary | ICD-10-CM | POA: Diagnosis not present

## 2013-11-12 DIAGNOSIS — E669 Obesity, unspecified: Secondary | ICD-10-CM | POA: Insufficient documentation

## 2013-11-12 DIAGNOSIS — J4550 Severe persistent asthma, uncomplicated: Secondary | ICD-10-CM

## 2013-11-12 DIAGNOSIS — R0902 Hypoxemia: Secondary | ICD-10-CM

## 2013-11-12 LAB — SPIROMETRY WITH GRAPH
FEF 25-75 PRE: 0.72 L/s
FEF 25-75 Post: 0.89 L/sec
FEF2575-%Change-Post: 23 %
FEF2575-%Pred-Post: 62 %
FEF2575-%Pred-Pre: 50 %
FEV1-%Change-Post: 7 %
FEV1-%Pred-Post: 72 %
FEV1-%Pred-Pre: 67 %
FEV1-Post: 1.17 L
FEV1-Pre: 1.09 L
FEV1FVC-%CHANGE-POST: 1 %
FEV1FVC-%Pred-Pre: 91 %
FEV6-%Change-Post: 6 %
FEV6-%PRED-PRE: 77 %
FEV6-%Pred-Post: 81 %
FEV6-POST: 1.64 L
FEV6-Pre: 1.54 L
FEV6FVC-%PRED-POST: 104 %
FEV6FVC-%PRED-PRE: 104 %
FVC-%CHANGE-POST: 5 %
FVC-%PRED-PRE: 74 %
FVC-%Pred-Post: 79 %
FVC-PRE: 1.57 L
FVC-Post: 1.66 L
Post FEV1/FVC ratio: 71 %
Post FEV6/FVC ratio: 100 %
Pre FEV1/FVC ratio: 70 %
Pre FEV6/FVC Ratio: 100 %

## 2013-11-12 MED ORDER — ALBUTEROL SULFATE (2.5 MG/3ML) 0.083% IN NEBU
2.5000 mg | INHALATION_SOLUTION | Freq: Once | RESPIRATORY_TRACT | Status: AC
  Administered 2013-11-12: 2.5 mg via RESPIRATORY_TRACT

## 2013-11-12 MED ORDER — METHYLPREDNISOLONE ACETATE 80 MG/ML IJ SUSP
120.0000 mg | Freq: Once | INTRAMUSCULAR | Status: AC
  Administered 2013-11-12: 120 mg via INTRAMUSCULAR

## 2013-11-12 NOTE — Assessment & Plan Note (Signed)
Severe persistent asthma with mild exacerbation This patient cannot yet be cleared for knee replacement surgery Plan Depo medrol 120mg  injection was given Stay on Qvar and Breo Will obtain pre-and post-spirometry Stop chlorpheniramine Consider stopping Adipex Phenteramine Return 2 months

## 2013-11-12 NOTE — Progress Notes (Signed)
Subjective:    Patient ID: [name], female    DOB: [birth year], 73 y.o.   MRN: [id number]  HPI  11/12/2013 Chief Complaint  Patient presents with  . 2 month follow up    Increased SOB with regular, daily activities x 1 month, chest pressure, and increased wheezing.  No cough.   No energy in the AM.  Sleeps by self.  No memory loss.  No daytime hypersomnolence. Sl cough.  Notes wheezing.  Notes DOE.  Depo medrol helped last ov Pt now on dilaudid for pain.   PUL ASTHMA HISTORY 11/12/2013 09/14/2013 06/05/2013 03/06/2012 12/22/2011  Symptoms Daily Throughout the day Daily Daily Daily  Nighttime awakenings 0-2/month 0-2/month >1/wk but not nightly Often--7/wk >1/wk but not nightly  Interference with activity Minor limitations Some limitations Some limitations Some limitations Some limitations  SABA use Several times/day Daily Several times/day Several times/day > 2 days/wk--not > 1 x/day  Exacerbations requiring oral steroids 2 or more / year 0-1 / year 0-1 / year 2 or more / year 0-1 / year     Review of Systems  Constitutional:   No  weight loss, night sweats,  Fevers, chills, fatigue, lassitude. HEENT:   No headaches,  Difficulty swallowing,  Tooth/dental problems,  Sore throat,                No sneezing, itching, ear ache, nasal congestion, post nasal drip,   CV:  No chest pain,  Orthopnea, PND, swelling in lower extremities, anasarca, dizziness, palpitations  GI  No heartburn, indigestion, abdominal pain, nausea, vomiting, diarrhea, change in bowel habits, loss of appetite  Resp: Notes  shortness of breath with exertion not  at rest.  No excess mucus, no productive cough,  No non-productive cough,  No coughing up of blood.  No change in color of mucus.  No wheezing.  No chest wall deformity  Skin: no rash or lesions.  GU: no dysuria, change in color of urine, no urgency or frequency.  No flank pain.  MS:  No joint pain or swelling.  No decreased range of motion.  No back  pain.  Psych:  No change in mood or affect. No depression or anxiety.  No memory loss.     Objective:   Physical Exam  Filed Vitals:   11/12/13 0938  BP: 120/68  Pulse: 92  Temp: 98.1 F (36.7 C)  TempSrc: Oral  Height: 5\' 3"  (1.6 m)  Weight: 207 lb 12.8 oz (94.257 kg)  SpO2: 99%    Gen: Pleasant, well-nourished, in no distress,  normal affect  ENT: No lesions,  mouth clear,  oropharynx clear, no postnasal drip  Neck: No JVD, no TMG, no carotid bruits  Lungs: No use of accessory muscles, no dullness to percussion, decreased wheeze but still present   Cardiovascular: RRR, heart sounds normal, no murmur or gallops, no peripheral edema  Abdomen: soft and NT, no HSM,  BS normal  Musculoskeletal: No deformities, no cyanosis or clubbing  Neuro: alert, non focal  Skin: Warm, no lesions or rashes  No results found.        Assessment & Plan:   Extrinsic asthma Severe persistent asthma with mild exacerbation This patient cannot yet be cleared for knee replacement surgery Plan Depo medrol 120mg  injection was given Stay on Qvar and Breo Will obtain pre-and post-spirometry Stop chlorpheniramine Consider stopping Adipex Phenteramine Return 2 months     Updated Medication List Outpatient Encounter Prescriptions as of 11/12/2013  Medication  Sig  . albuterol (PROVENTIL HFA;VENTOLIN HFA) 108 (90 BASE) MCG/ACT inhaler Inhale 2 puffs into the lungs every 6 (six) hours as needed. For shortness of breath.  Marland Kitchen albuterol (PROVENTIL) (2.5 MG/3ML) 0.083% nebulizer solution Take 2.5 mg by nebulization every 6 (six) hours as needed. For shortness of breath.  . ALPRAZolam (XANAX) 0.25 MG tablet Take 0.25 mg by mouth 2 (two) times daily as needed.   . baclofen (LIORESAL) 20 MG tablet Take 1 tablet by mouth at bedtime.  . beclomethasone (QVAR) 80 MCG/ACT inhaler Inhale 3 puffs into the lungs 2 (two) times daily.  . Calcium & Magnesium Carbonates (MYLANTA PO) Take 1 tablet by mouth  daily as needed. For indigestion.  . Cholecalciferol (VITAMIN D) 2000 UNITS tablet Take 2,000 Units by mouth daily.   . clopidogrel (PLAVIX) 75 MG tablet Take 75 mg by mouth every morning.  . diltiazem (CARDIZEM CD) 240 MG 24 hr capsule Take 1 capsule (240 mg total) by mouth daily.  Marland Kitchen doxazosin (CARDURA) 8 MG tablet TAKE 1 TABLET BY MOUTH EVERY DAY AT BEDTIME  . famotidine (PEPCID) 20 MG tablet TAKE 1 TABLET BY MOUTH EVERY DAY  . Fluticasone Furoate-Vilanterol (BREO ELLIPTA) 100-25 MCG/INH AEPB Inhale 1 puff into the lungs daily.  . furosemide (LASIX) 40 MG tablet TAKE 1 TABLET BY MOUTH TWICE A MONTH  . HYDROmorphone (DILAUDID) 2 MG tablet Take 1 tablet by mouth Once daily as needed.  . insulin lispro protamine-insulin lispro (HUMALOG 75/25) (75-25) 100 UNIT/ML SUSP Inject 20-45 Units into the skin 2 (two) times daily with a meal. 20 in am, 45 at dinner  . meclizine (ANTIVERT) 25 MG tablet as needed.  . metoprolol succinate (TOPROL-XL) 50 MG 24 hr tablet Take 1 tablet by mouth daily.  . Multiple Vitamin (MULTIVITAMIN WITH MINERALS) TABS Take 1 tablet by mouth daily.  . nitroGLYCERIN (NITROSTAT) 0.4 MG SL tablet Place 0.4 mg under the tongue every 5 (five) minutes x 3 doses as needed. For chest pain.  Marland Kitchen omeprazole (PRILOSEC) 20 MG capsule TAKE ONE CAPSULE BY MOUTH EVERY DAY AS NEEDED FOR INDIGESTION (NEED DR APPOINTMENT)  . ONE TOUCH ULTRA TEST test strip 3-4 times daily as directed  . phentermine (ADIPEX-P) 37.5 MG tablet Take 1 tablet by mouth daily.  . potassium chloride SA (K-DUR,KLOR-CON) 20 MEQ tablet TAKE 1 TABLET BY MOUTH EVERY DAY  . [DISCONTINUED] chlorpheniramine (CHLOR-TRIMETON) 4 MG tablet Take 2 tablets (8 mg total) by mouth at bedtime.  . [DISCONTINUED] chlorpheniramine (CHLOR-TRIMETON) 4 MG tablet Take 4 mg by mouth at bedtime.  . [DISCONTINUED] methocarbamol (ROBAXIN) 500 MG tablet Take 1 tablet by mouth 2 (two) times daily.   . [DISCONTINUED] losartan (COZAAR) 50 MG tablet Take  50 mg by mouth every morning.  . [EXPIRED] methylPREDNISolone acetate (DEPO-MEDROL) injection 120 mg

## 2013-11-12 NOTE — Patient Instructions (Signed)
Depo medrol 120mg  injection was given Stay on Qvar and Breo We will get the spirometry test done Stop chlorpheniramine Consider stopping Adipex Phenteramine Return 2 months

## 2013-11-13 NOTE — Progress Notes (Signed)
Quick Note:  Called, spoke with pt. Informed her of PFT results and recs per Dr. Joya Gaskins. She verbalized understanding and voiced no further questions or concerns at this time. ______

## 2013-11-15 ENCOUNTER — Telehealth: Payer: Self-pay | Admitting: Cardiovascular Disease

## 2013-11-15 NOTE — Telephone Encounter (Signed)
Received request from Nurse fax box, documents faxed for surgical clearance. To: [employer] Fax number: [phone] Attention: 10.8.15/km

## 2013-11-22 ENCOUNTER — Telehealth: Payer: Self-pay | Admitting: Critical Care Medicine

## 2013-11-22 NOTE — Telephone Encounter (Signed)
Received surgical clearanc fax from [place] for a right knee: TKA medial and lateral w/wo patella resurgacing.   PW has "cleared pt for surgery with the following recommendations of: maintain current inhalers."   Form faxed to [facility] with Dr. Gladstone Lighter at [phone] and placed in scan folder.

## 2013-12-07 ENCOUNTER — Other Ambulatory Visit: Payer: Self-pay | Admitting: Surgical

## 2013-12-19 ENCOUNTER — Other Ambulatory Visit: Payer: Self-pay | Admitting: Cardiovascular Disease

## 2014-01-19 ENCOUNTER — Other Ambulatory Visit: Payer: Self-pay | Admitting: Cardiovascular Disease

## 2014-01-23 ENCOUNTER — Ambulatory Visit (INDEPENDENT_AMBULATORY_CARE_PROVIDER_SITE_OTHER): Payer: Medicare Other | Admitting: Critical Care Medicine

## 2014-01-23 ENCOUNTER — Encounter: Payer: Self-pay | Admitting: Critical Care Medicine

## 2014-01-23 ENCOUNTER — Ambulatory Visit: Payer: Medicare Other | Admitting: Critical Care Medicine

## 2014-01-23 VITALS — BP 120/66 | HR 76 | Temp 98.2°F | Ht 63.0 in | Wt 202.0 lb

## 2014-01-23 DIAGNOSIS — M179 Osteoarthritis of knee, unspecified: Secondary | ICD-10-CM

## 2014-01-23 DIAGNOSIS — M1711 Unilateral primary osteoarthritis, right knee: Secondary | ICD-10-CM

## 2014-01-23 DIAGNOSIS — J4550 Severe persistent asthma, uncomplicated: Secondary | ICD-10-CM

## 2014-01-23 MED ORDER — FLUTICASONE FUROATE-VILANTEROL 200-25 MCG/INH IN AEPB: 1.0000 | INHALATION_SPRAY | Freq: Every day | RESPIRATORY_TRACT | Status: DC

## 2014-01-23 NOTE — Progress Notes (Signed)
Subjective:    Patient ID: [name], female    DOB: 04/20/1940, 73 y.o.   MRN: [id number]  HPI  01/23/2014 Chief Complaint  Patient presents with  . Follow-up    Pt c/o good and bad days.  bad days include increased sob, chest tightness.   Overall stable .  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  Notes several times daily use of SABA Pt notes a pulling and tightness in chest area.     PUL ASTHMA HISTORY 01/23/2014 11/12/2013 09/14/2013 06/05/2013 03/06/2012 12/22/2011  Symptoms >2 days/week Daily Throughout the day Daily Daily Daily  Nighttime awakenings >1/wk but not nightly 0-2/month 0-2/month >1/wk but not nightly Often--7/wk >1/wk but not nightly  Interference with activity Some limitations Minor limitations Some limitations Some limitations Some limitations Some limitations  SABA use Several times/day Several times/day Daily Several times/day Several times/day > 2 days/wk--not > 1 x/day  Exacerbations requiring oral steroids 0-1 / year 2 or more / year 0-1 / year 0-1 / year 2 or more / year 0-1 / year     Review of Systems Constitutional:   No  weight loss, night sweats,  Fevers, chills, fatigue, lassitude. HEENT:   No headaches,  Difficulty swallowing,  Tooth/dental problems,  Sore throat,                No sneezing, itching, ear ache, nasal congestion, post nasal drip,   CV:  No chest pain,  Orthopnea, PND, swelling in lower extremities, anasarca, dizziness, palpitations  GI  No heartburn, indigestion, abdominal pain, nausea, vomiting, diarrhea, change in bowel habits, loss of appetite  Resp: Notes  shortness of breath with exertion not  at rest.  No excess mucus, no productive cough,  No non-productive cough,  No coughing up of blood.  No change in color of mucus.  No wheezing.  No chest wall deformity  Skin: no rash or lesions.  GU: no dysuria, change in color of urine, no urgency or frequency.  No flank pain.  MS:  No joint pain or swelling.  No decreased  range of motion.  No back pain.  Psych:  No change in mood or affect. No depression or anxiety.  No memory loss.     Objective:   Physical Exam Filed Vitals:   01/23/14 1604  BP: 120/66  Pulse: 76  Temp: 98.2 F (36.8 C)  TempSrc: Oral  Height: 5\' 3"  (1.6 m)  Weight: 202 lb (91.627 kg)  SpO2: 97%    Gen: Pleasant, well-nourished, in no distress,  normal affect  ENT: No lesions,  mouth clear,  oropharynx clear, no postnasal drip  Neck: No JVD, no TMG, no carotid bruits  Lungs: No use of accessory muscles, no dullness to percussion, decreased wheeze but still present   Cardiovascular: RRR, heart sounds normal, no murmur or gallops, no peripheral edema  Abdomen: soft and NT, no HSM,  BS normal  Musculoskeletal: No deformities, no cyanosis or clubbing  Neuro: alert, non focal  Skin: Warm, no lesions or rashes  No results found.        Assessment & Plan:   Osteoarthritis of right knee The pt is cleared pulm wise for planned Total knee replacement surgery  Extrinsic asthma Moderate persistent asthma stable at present Plan Breo 200 one puff daily (new dose) use samples and see refills Qvar 80 three puff twice daily No other changes You are cleared for surgery on the knee Return 3 months  Updated Medication List Outpatient Encounter Prescriptions as of 01/23/2014  Medication Sig  . albuterol (PROVENTIL HFA;VENTOLIN HFA) 108 (90 BASE) MCG/ACT inhaler Inhale 2 puffs into the lungs every 6 (six) hours as needed. For shortness of breath.  Marland Kitchen albuterol (PROVENTIL) (2.5 MG/3ML) 0.083% nebulizer solution Take 2.5 mg by nebulization every 6 (six) hours as needed. For shortness of breath.  . ALPRAZolam (XANAX) 0.25 MG tablet Take 0.25 mg by mouth 2 (two) times daily as needed.   . beclomethasone (QVAR) 80 MCG/ACT inhaler Inhale 3 puffs into the lungs 2 (two) times daily.  . Calcium & Magnesium Carbonates (MYLANTA PO) Take 1 tablet by mouth daily as needed. For  indigestion.  . canagliflozin (INVOKANA) 300 MG TABS tablet Take 300 mg by mouth daily before breakfast.  . Cholecalciferol (VITAMIN D) 2000 UNITS tablet Take 2,000 Units by mouth daily.   . clopidogrel (PLAVIX) 75 MG tablet Take 75 mg by mouth every morning.  . diltiazem (CARDIZEM CD) 240 MG 24 hr capsule Take 1 capsule (240 mg total) by mouth daily.  Marland Kitchen doxazosin (CARDURA) 8 MG tablet TAKE 1 TABLET BY MOUTH EVERY DAY AT BEDTIME (MUST MAKE APPOINTMENT FOR FUTURE REFILLS)  . famotidine (PEPCID) 20 MG tablet TAKE 1 TABLET BY MOUTH EVERY DAY  . furosemide (LASIX) 40 MG tablet TAKE 1 TABLET BY MOUTH TWICE A MONTH  . HYDROmorphone (DILAUDID) 2 MG tablet Take 1 tablet by mouth Once daily as needed.  . insulin lispro protamine-insulin lispro (HUMALOG 75/25) (75-25) 100 UNIT/ML SUSP Inject 20-45 Units into the skin 2 (two) times daily with a meal. 20 in am, 45 at dinner  . meclizine (ANTIVERT) 25 MG tablet as needed.  . metoprolol succinate (TOPROL-XL) 50 MG 24 hr tablet Take 1 tablet by mouth daily.  . Multiple Vitamin (MULTIVITAMIN WITH MINERALS) TABS Take 1 tablet by mouth daily.  . nitroGLYCERIN (NITROSTAT) 0.4 MG SL tablet Place 0.4 mg under the tongue every 5 (five) minutes x 3 doses as needed. For chest pain.  Marland Kitchen omeprazole (PRILOSEC) 20 MG capsule TAKE ONE CAPSULE BY MOUTH EVERY DAY AS NEEDED FOR INDIGESTION (NEED DR APPOINTMENT)  . ONE TOUCH ULTRA TEST test strip 3-4 times daily as directed  . potassium chloride SA (K-DUR,KLOR-CON) 20 MEQ tablet TAKE 1 TABLET BY MOUTH EVERY DAY  . [DISCONTINUED] Fluticasone Furoate-Vilanterol (BREO ELLIPTA) 100-25 MCG/INH AEPB Inhale 1 puff into the lungs daily.  . baclofen (LIORESAL) 20 MG tablet Take 1 tablet by mouth 2 (two) times daily.  . Fluticasone Furoate-Vilanterol (BREO ELLIPTA) 200-25 MCG/INH AEPB Inhale 1 puff into the lungs daily.  . [DISCONTINUED] baclofen (LIORESAL) 20 MG tablet Take 1 tablet by mouth at bedtime.  . [DISCONTINUED] omeprazole  (PRILOSEC) 40 MG capsule Take 1 capsule by mouth daily before breakfast.  . [DISCONTINUED] phentermine (ADIPEX-P) 37.5 MG tablet Take 1 tablet by mouth daily.

## 2014-01-23 NOTE — Patient Instructions (Signed)
Breo 200 one puff daily (new dose) use samples and see refills Qvar 80 three puff twice daily No other changes You are cleared for surgery on the knee Return 3 months

## 2014-01-24 NOTE — Assessment & Plan Note (Signed)
The pt is cleared pulm wise for planned Total knee replacement surgery

## 2014-01-24 NOTE — Assessment & Plan Note (Signed)
Moderate persistent asthma stable at present Plan Breo 200 one puff daily (new dose) use samples and see refills Qvar 80 three puff twice daily No other changes You are cleared for surgery on the knee Return 3 months

## 2014-02-13 ENCOUNTER — Encounter (HOSPITAL_COMMUNITY)
Admission: RE | Admit: 2014-02-13 | Discharge: 2014-02-13 | Disposition: A | Discharge status: Home / Self Care | Payer: Medicare Other | Source: Ambulatory Visit | Attending: Orthopedic Surgery | Admitting: Orthopedic Surgery

## 2014-02-13 ENCOUNTER — Encounter (HOSPITAL_COMMUNITY): Payer: Self-pay

## 2014-02-13 ENCOUNTER — Ambulatory Visit (HOSPITAL_COMMUNITY)
Admission: RE | Admit: 2014-02-13 | Discharge: 2014-02-13 | Disposition: A | Discharge status: Home / Self Care | Payer: Medicare Other | Source: Ambulatory Visit | Attending: Anesthesiology | Admitting: Anesthesiology

## 2014-02-13 ENCOUNTER — Other Ambulatory Visit: Payer: Self-pay

## 2014-02-13 DIAGNOSIS — I517 Cardiomegaly: Secondary | ICD-10-CM | POA: Insufficient documentation

## 2014-02-13 DIAGNOSIS — Z01818 Encounter for other preprocedural examination: Secondary | ICD-10-CM

## 2014-02-13 DIAGNOSIS — E119 Type 2 diabetes mellitus without complications: Secondary | ICD-10-CM | POA: Diagnosis not present

## 2014-02-13 DIAGNOSIS — I10 Essential (primary) hypertension: Secondary | ICD-10-CM | POA: Diagnosis not present

## 2014-02-13 DIAGNOSIS — Z951 Presence of aortocoronary bypass graft: Secondary | ICD-10-CM | POA: Insufficient documentation

## 2014-02-13 DIAGNOSIS — Z96611 Presence of right artificial shoulder joint: Secondary | ICD-10-CM | POA: Insufficient documentation

## 2014-02-13 DIAGNOSIS — I4510 Unspecified right bundle-branch block: Secondary | ICD-10-CM | POA: Insufficient documentation

## 2014-02-13 LAB — URINALYSIS, ROUTINE W REFLEX MICROSCOPIC
Glucose, UA: NEGATIVE mg/dL
Hgb urine dipstick: NEGATIVE
Ketones, ur: NEGATIVE mg/dL
Nitrite: NEGATIVE
Protein, ur: NEGATIVE mg/dL
Specific Gravity, Urine: 1.027 (ref 1.005–1.030)
Urobilinogen, UA: 1 mg/dL (ref 0.0–1.0)
pH: 5 (ref 5.0–8.0)

## 2014-02-13 LAB — CBC
HCT: 36.6 % (ref 36.0–46.0)
HEMOGLOBIN: 10.9 g/dL — AB (ref 12.0–15.0)
MCH: 24.2 pg — AB (ref 26.0–34.0)
MCHC: 29.8 g/dL — AB (ref 30.0–36.0)
MCV: 81.2 fL (ref 78.0–100.0)
PLATELETS: 269 10*3/uL (ref 150–400)
RBC: 4.51 MIL/uL (ref 3.87–5.11)
RDW: 18.2 % — ABNORMAL HIGH (ref 11.5–15.5)
WBC: 8.1 10*3/uL (ref 4.0–10.5)

## 2014-02-13 LAB — COMPREHENSIVE METABOLIC PANEL
ALT: 14 U/L (ref 0–35)
AST: 17 U/L (ref 0–37)
Albumin: 3.7 g/dL (ref 3.5–5.2)
Alkaline Phosphatase: 104 U/L (ref 39–117)
Anion gap: 8 (ref 5–15)
BUN: 15 mg/dL (ref 6–23)
CO2: 31 mmol/L (ref 19–32)
Calcium: 8.9 mg/dL (ref 8.4–10.5)
Chloride: 106 mEq/L (ref 96–112)
Creatinine, Ser: 0.66 mg/dL (ref 0.50–1.10)
GFR calc Af Amer: >90 mL/min (ref >90)
GFR calc non Af Amer: 84 mL/min — ABNORMAL LOW (ref >90)
Glucose, Bld: 139 mg/dL — ABNORMAL HIGH (ref 70–99)
Potassium: 4.3 mmol/L (ref 3.5–5.1)
Sodium: 145 mmol/L (ref 135–145)
Total Bilirubin: 0.5 mg/dL (ref 0.3–1.2)
Total Protein: 6.9 g/dL (ref 6.0–8.3)

## 2014-02-13 LAB — SURGICAL PCR SCREEN
MRSA, PCR: NEGATIVE
STAPHYLOCOCCUS AUREUS: NEGATIVE

## 2014-02-13 LAB — URINE MICROSCOPIC-ADD ON

## 2014-02-13 LAB — PROTIME-INR
INR: 1.04 (ref 0.00–1.49)
Prothrombin Time: 13.7 seconds (ref 11.6–15.2)

## 2014-02-13 LAB — ABO/RH: ABO/RH(D): A POS

## 2014-02-13 NOTE — Pre-Procedure Instructions (Addendum)
02-13-14 EKG/ CXR done today. Clearance note 1'16 Dr. Joya Gaskins Dr. Acie Fredrickson with chart. 02-13-14 1455 Labs viewable in [facility], please note urinalysis. Is a known Diabetic-labs not fasting, will have CBG on arrival preop. Note per Epic To Dr. Charlestine Night office  pod [phone].

## 2014-02-13 NOTE — Pre-Procedure Instructions (Addendum)
02-13-14 EKG/ CXR done today. Pt has extreme long(1-3 inch finger nails) , advised pt she may want to consider trimming prior to surgery day.

## 2014-02-13 NOTE — Patient Instructions (Addendum)
[name]  02/13/2014   Your procedure is scheduled on:   02-20-2014  Enter through [facility]  Entrance and follow signs to [facility]. Arrive at     0630   AM .  Call this number if you have problems the morning of surgery: [phone]  Or Presurgical Testing [phone].   For Living Will and/or Health Care Power Attorney Forms: please provide copy for your medical record,may bring AM of surgery(Forms should be already notarized -we do not provide this service).(02-13-14  No/  information preferred today and given).     Do not eat food/ or drink: After Midnight.      Take these medicines the morning of surgery with A SIP OF WATER: Diltiazem. Omeprazole(prilosec). Metoprolol. Use Insulin (1/2 usual PM dose) night before.Take no Insulin or Diabetic meds AM of. Use/Bring Inhalers as usual.    Do not wear jewelry, make-up or nail polish.  Do not wear deodorant, lotions, powders, or perfumes.   Do not shave legs and under arms- 48 hours(2 days) prior to first CHG shower.(Shaving face and neck okay.)  Do not bring valuables to the hospital.(Hospital is not responsible for lost valuables).  Contacts, dentures or removable bridgework, body piercing, hair pins may not be worn into surgery.  Leave suitcase in the car. After surgery it may be brought to your room.  For patients admitted to the hospital, checkout time is 11:00 AM the day of discharge.(Restricted visitors-Any Persons displaying flu-like symptoms or illness).    Patients discharged the day of surgery will not be allowed to drive home. Must have responsible person with you x 24 hours once discharged.  Name and phone number of your driver: Morene Crocker, friend [phone] cell     Please read over the following fact sheets that you were given:  CHG(Chlorhexidine Gluconate 4% Surgical Soap) use, MRSA Information, Blood Transfusion fact sheet, Incentive Spirometry Instruction.  Remember : Type/Screen "Blue armbands"  - may not be removed once applied(would result in being retested AM of surgery, if removed).         [facility] - Preparing for Surgery Before surgery, you can play an important role.  Because skin is not sterile, your skin needs to be as free of germs as possible.  You can reduce the number of germs on your skin by washing with CHG (chlorahexidine gluconate) soap before surgery.  CHG is an antiseptic cleaner which kills germs and bonds with the skin to continue killing germs even after washing. Please DO NOT use if you have an allergy to CHG or antibacterial soaps.  If your skin becomes reddened/irritated stop using the CHG and inform your nurse when you arrive at Short Stay. Do not shave (including legs and underarms) for at least 48 hours prior to the first CHG shower.  You may shave your face/neck. Please follow these instructions carefully:  1.  Shower with CHG Soap the night before surgery and the  morning of Surgery.  2.  If you choose to wash your hair, wash your hair first as usual with your  normal  shampoo.  3.  After you shampoo, rinse your hair and body thoroughly to remove the  shampoo.                           4.  Use CHG as you would any other liquid soap.  You can apply chg directly  to the skin  and wash                       Gently with a scrungie or clean washcloth.  5.  Apply the CHG Soap to your body ONLY FROM THE NECK DOWN.   Do not use on face/ open                           Wound or open sores. Avoid contact with eyes, ears mouth and genitals (private parts).                       Wash face,  Genitals (private parts) with your normal soap.             6.  Wash thoroughly, paying special attention to the area where your surgery  will be performed.  7.  Thoroughly rinse your body with warm water from the neck down.  8.  DO NOT shower/wash with your normal soap after using and rinsing off  the CHG Soap.                9.  Pat yourself dry with a clean towel.            10.   Wear clean pajamas.            11.  Place clean sheets on your bed the night of your first shower and do not  sleep with pets. Day of Surgery : Do not apply any lotions/deodorants the morning of surgery.  Please wear clean clothes to the hospital/surgery center.  FAILURE TO FOLLOW THESE INSTRUCTIONS MAY RESULT IN THE CANCELLATION OF YOUR SURGERY PATIENT SIGNATURE_________________________________  NURSE SIGNATURE__________________________________  ________________________________________________________________________   [name]  An incentive spirometer is a tool that can help keep your lungs clear and active. This tool measures how well you are filling your lungs with each breath. Taking long deep breaths may help reverse or decrease the chance of developing breathing (pulmonary) problems (especially infection) following:  A long period of time when you are unable to move or be active. BEFORE THE PROCEDURE   If the spirometer includes an indicator to show your best effort, your nurse or respiratory therapist will set it to a desired goal.  If possible, sit up straight or lean slightly forward. Try not to slouch.  Hold the incentive spirometer in an upright position. INSTRUCTIONS FOR USE   Sit on the edge of your bed if possible, or sit up as far as you can in bed or on a chair.  Hold the incentive spirometer in an upright position.  Breathe out normally.  Place the mouthpiece in your mouth and seal your lips tightly around it.  Breathe in slowly and as deeply as possible, raising the piston or the ball toward the top of the column.  Hold your breath for 3-5 seconds or for as long as possible. Allow the piston or ball to fall to the bottom of the column.  Remove the mouthpiece from your mouth and breathe out normally.  Rest for a few seconds and repeat Steps 1 through 7 at least 10 times every 1-2 hours when you are awake. Take your time and take a few normal  breaths between deep breaths.  The spirometer may include an indicator to show your best effort. Use the indicator as a goal to work toward during each repetition.  After each set of  10 deep breaths, practice coughing to be sure your lungs are clear. If you have an incision (the cut made at the time of surgery), support your incision when coughing by placing a pillow or rolled up towels firmly against it. Once you are able to get out of bed, walk around indoors and cough well. You may stop using the incentive spirometer when instructed by your caregiver.  RISKS AND COMPLICATIONS  Take your time so you do not get dizzy or light-headed.  If you are in pain, you may need to take or ask for pain medication before doing incentive spirometry. It is harder to take a deep breath if you are having pain. AFTER USE  Rest and breathe slowly and easily.  It can be helpful to keep track of a log of your progress. Your caregiver can provide you with a simple table to help with this. If you are using the spirometer at home, follow these instructions: [place] IF:   You are having difficultly using the spirometer.  You have trouble using the spirometer as often as instructed.  Your pain medication is not giving enough relief while using the spirometer.  You develop fever of 100.5 F (38.1 C) or higher. SEEK IMMEDIATE MEDICAL CARE IF:   You cough up bloody sputum that had not been present before.  You develop fever of 102 F (38.9 C) or greater.  You develop worsening pain at or near the incision site. MAKE SURE YOU:   Understand these instructions.  Will watch your condition.  Will get help right away if you are not doing well or get worse. Document Released: 06/07/2006 Document Revised: 04/19/2011 Document Reviewed: 08/08/2006 ExitCare Patient Information 2014 ExitCare, [place].   ________________________________________________________________________  WHAT IS A BLOOD  TRANSFUSION? Blood Transfusion Information  A transfusion is the replacement of blood or some of its parts. Blood is made up of multiple cells which provide different functions.  Red blood cells carry oxygen and are used for blood loss replacement.  White blood cells fight against infection.  Platelets control bleeding.  Plasma helps clot blood.  Other blood products are available for specialized needs, such as hemophilia or other clotting disorders. BEFORE THE TRANSFUSION  Who gives blood for transfusions?   Healthy volunteers who are fully evaluated to make sure their blood is safe. This is blood bank blood. Transfusion therapy is the safest it has ever been in the practice of medicine. Before blood is taken from a donor, a complete history is taken to make sure that person has no history of diseases nor engages in risky social behavior (examples are intravenous drug use or sexual activity with multiple partners). The donor's travel history is screened to minimize risk of transmitting infections, such as malaria. The donated blood is tested for signs of infectious diseases, such as HIV and hepatitis. The blood is then tested to be sure it is compatible with you in order to minimize the chance of a transfusion reaction. If you or a relative donates blood, this is often done in anticipation of surgery and is not appropriate for emergency situations. It takes many days to process the donated blood. RISKS AND COMPLICATIONS Although transfusion therapy is very safe and saves many lives, the main dangers of transfusion include:   Getting an infectious disease.  Developing a transfusion reaction. This is an allergic reaction to something in the blood you were given. Every precaution is taken to prevent this. The decision to have a blood transfusion  has been considered carefully by your caregiver before blood is given. Blood is not given unless the benefits outweigh the risks. AFTER THE  TRANSFUSION  Right after receiving a blood transfusion, you will usually feel much better and more energetic. This is especially true if your red blood cells have gotten low (anemic). The transfusion raises the level of the red blood cells which carry oxygen, and this usually causes an energy increase.  The nurse administering the transfusion will monitor you carefully for complications. HOME CARE INSTRUCTIONS  No special instructions are needed after a transfusion. You may find your energy is better. Speak with your caregiver about any limitations on activity for underlying diseases you may have. SEEK MEDICAL CARE IF:   Your condition is not improving after your transfusion.  You develop redness or irritation at the intravenous (IV) site. SEEK IMMEDIATE MEDICAL CARE IF:  Any of the following symptoms occur over the next 12 hours:  Shaking chills.  You have a temperature by mouth above 102 F (38.9 C), not controlled by medicine.  Chest, back, or muscle pain.  People around you feel you are not acting correctly or are confused.  Shortness of breath or difficulty breathing.  Dizziness and fainting.  You get a rash or develop hives.  You have a decrease in urine output.  Your urine turns a dark color or changes to pink, red, or brown. Any of the following symptoms occur over the next 10 days:  You have a temperature by mouth above 102 F (38.9 C), not controlled by medicine.  Shortness of breath.  Weakness after normal activity.  The white part of the eye turns yellow (jaundice).  You have a decrease in the amount of urine or are urinating less often.  Your urine turns a dark color or changes to pink, red, or brown. Document Released: 01/23/2000 Document Revised: 04/19/2011 Document Reviewed: 09/11/2007 [facility] Patient Information 2014 [place], [place].  _______________________________________________________________________

## 2014-02-15 ENCOUNTER — Ambulatory Visit (INDEPENDENT_AMBULATORY_CARE_PROVIDER_SITE_OTHER): Payer: Medicare Other | Admitting: Cardiovascular Disease

## 2014-02-15 ENCOUNTER — Encounter: Payer: Self-pay | Admitting: Cardiovascular Disease

## 2014-02-15 VITALS — BP 120/82 | HR 72 | Ht 62.0 in | Wt 203.0 lb

## 2014-02-15 DIAGNOSIS — I25810 Atherosclerosis of coronary artery bypass graft(s) without angina pectoris: Secondary | ICD-10-CM

## 2014-02-15 NOTE — Progress Notes (Signed)
[name] Date of Birth  1941-02-06 [place]  [address]    [place]   [place], [place]  [address]    [place],   [phone]  Fax  [phone]  [phone]  Fax [phone]  Problem list: 1. Coronary artery disease-status post CABG, the saphenous vein graft to the right coronary artery is severely diseased but she has good flow to the distal right coronary artery from the native circulation. 2. Diabetes mellitus 3. Hypertension 4. Hyperlipidemia 5. COPD: June, 2013 - FEV1 equals 0.89 L which is 48% of predicted value. Her DLCO is also markedly depressed. 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   History of Present Illness:  [name] is a 74 y.o. female with the above noted hx.  She continues to have episodes of sharp pain ( few seconds)  followed by a burning pain ( last 6-7 minutes).  She usually does not take NTG because it causes a headache.  She was started on Lasix earlier this year. She seems to be doing much better on Lasix. She's had only function test which revealed severe obstructive lung disease with a good initial response to bronchodilators.  She is scheduled to have surgery all right knee. She's here today for preoperative evaluation prior to having the surgery.  September 04, 2012:  [name] is seen today for a follow up visit.  She saw Richardson Dopp in April and was started on bystolic.  She has tolerated that fairly well.    She has some episodes of CP that are constant.    She is very stable.   03/01/2013:  She continues to have significant CP.  Still short of breath.  She has no energy.  Just does not feel well in general.    She does not get much sleep - perhaps 3 hours a night.   Jan. 8, 2016:  [name] is a 74 yo who we follow for CAD, DM, HTN, hyperlipidemia, .  She has COPD:  FEV1 equals 0.89 L which is 48% of predicted value. Her DLCO is also  markedly depressed. 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  Breathing is the same.  Has some upper respiratory issues.  Still has some tightness.    Current Outpatient Prescriptions on File Prior to Visit  Medication Sig Dispense Refill  . albuterol (PROVENTIL HFA;VENTOLIN HFA) 108 (90 BASE) MCG/ACT inhaler Inhale 2 puffs into the lungs every 6 (six) hours as needed. For shortness of breath. 1 Inhaler 0  . albuterol (PROVENTIL) (2.5 MG/3ML) 0.083% nebulizer solution Take 2.5 mg by nebulization every 6 (six) hours as needed. For shortness of breath.    . ALPRAZolam (XANAX) 0.25 MG tablet Take 0.25 mg by mouth 2 (two) times daily as needed.     . baclofen (LIORESAL) 20 MG tablet Take 1 tablet by mouth 2 (two) times daily.    . beclomethasone (QVAR) 80 MCG/ACT inhaler Inhale 3 puffs into the lungs 2 (two) times daily. 1 Inhaler 12  . Calcium & Magnesium Carbonates (MYLANTA PO) Take 1 tablet by mouth daily as needed. For indigestion.    . canagliflozin (INVOKANA) 300 MG TABS tablet Take 300 mg by mouth daily before breakfast.    . Cholecalciferol (VITAMIN D) 2000 UNITS tablet Take 2,000 Units by mouth daily.     Marland Kitchen diltiazem (CARDIZEM CD) 240 MG 24  hr capsule Take 1 capsule (240 mg total) by mouth daily. 30 capsule 6  . doxazosin (CARDURA) 8 MG tablet Take 8 mg by mouth daily.    . famotidine (PEPCID) 20 MG tablet Take 20 mg by mouth 2 (two) times daily.    . Fluticasone Furoate-Vilanterol (BREO ELLIPTA) 200-25 MCG/INH AEPB Inhale 1 puff into the lungs daily. 60 each 6  . furosemide (LASIX) 40 MG tablet Take 40 mg by mouth.    Marland Kitchen HYDROmorphone (DILAUDID) 2 MG tablet Take 1 tablet by mouth Once daily as needed.    . insulin lispro protamine-insulin lispro (HUMALOG 75/25) (75-25) 100 UNIT/ML SUSP Inject 20-45 Units into the skin 2 (two) times daily with a meal. 20 in am, 45 at dinner    . meclizine (ANTIVERT) 25 MG tablet as needed.    . metoprolol  succinate (TOPROL-XL) 50 MG 24 hr tablet Take 1 tablet by mouth daily.    . Multiple Vitamin (MULTIVITAMIN WITH MINERALS) TABS Take 1 tablet by mouth daily.    . nitroGLYCERIN (NITROSTAT) 0.4 MG SL tablet Place 0.4 mg under the tongue every 5 (five) minutes x 3 doses as needed. For chest pain.    Marland Kitchen omeprazole (PRILOSEC) 20 MG capsule Take 20 mg by mouth daily.    . potassium chloride SA (K-DUR,KLOR-CON) 20 MEQ tablet Take 20 mEq by mouth 2 (two) times daily.    . clopidogrel (PLAVIX) 75 MG tablet Take 75 mg by mouth every morning.     No current facility-administered medications on file prior to visit.    Allergies  Allergen Reactions  . Amlodipine   . Banana Nausea And Vomiting    Stomach pumped  . Co Q10 [Coenzyme Q10]     Body cramps  . Codeine     Hallucinate, loose identity and don't know who I am  . Morphine Other (See Comments)    Can not function, it immobilizes me   . Sulfonamide Derivatives Swelling    Past Medical History  Diagnosis Date  . Coronary artery disease   . Diabetes mellitus   . Hypertension   . Hyperlipidemia   . Shoulder injury     resolved after shoulder surgery  . Chest pain   . Acute myocardial infarction, unspecified site, episode of care unspecified   . Unspecified asthma(493.90)   . PONV (postoperative nausea and vomiting)   . Anxiety   . GERD (gastroesophageal reflux disease)   . Upper respiratory symptom     02-13-14 "runny nose, mild cough,sniffles" -instructed to inform MD if symptoms worsens    Past Surgical History  Procedure Laterality Date  . Cardiac catheterization  07/23/2009    EF 60%  . Cardiac catheterization  10/11/2008  . Cardiac catheterization  03/01/2007    EF 75-80%  . Coronary artery bypass graft      SEVERELY DISEASED SAPHENOUS VEIN GRAFT TO THE RIGHT CORONARY ARTERY BUT WITH FAIRLY WELL PRESERVED FLOW TO THE DISTAL RIGHT CORONARY ARTERY FROM THE NATIVE CIRCULATION-RESTART  CATH IN JUNE 2000, REVEALS MILD/MODERATE  CAD  WITH GOOD FLOW DOWN HER LAD  . US echocardiography  03/08/2008    EF 55-60%  . Cardiovascular stress test  11/15/2007    EF 60%  . Rotator cuff repair      right and left  . Tumor removed kidney    . Tonsillectomy      age 40  . Abdominal hysterectomy    . Appendectomy      came out  with Hysterectomy  . Eye surgery      bilateral cataract surgery with lens implant    History  Smoking status  . Former Smoker -- 0.50 packs/day for 25 years  . Types: Cigarettes  . Quit date: 02/09/1976  Smokeless tobacco  . Never Used    History  Alcohol Use No    Family History  Problem Relation Age of Onset  . Emphysema Paternal Uncle   . Heart disease Maternal Grandfather   . Esophageal cancer Brother     Reviw of Systems:  Reviewed in the HPI.  All other systems are negative.  Physical Exam: Blood pressure 120/82, pulse 72, height 5\' 2"  (1.575 m), weight 203 lb (92.08 kg).  Filed Weights   02/15/14 1108  Weight: 203 lb (92.08 kg)    General: Well developed, well nourished, in no acute distress. Head: Normocephalic, atraumatic, sclera non-icteric, mucus membranes are moist,  Neck: Supple. Negative for carotid bruits. JVD not elevated. Lungs: mild wheezing bilaterally.  Heart: RRR with S1 S2. No murmurs, rubs, or gallops Abdomen: Soft, non-tender, non-distended with normoactive bowel sounds. No hepatomegaly. No rebound/guarding. No obvious abdominal masses. Msk:  Strength and tone appear normal for age. Extremities: No clubbing or cyanosis. No edema.  Distal pedal pulses are 2+ and equal bilaterally.  She has extremely long fingernails Neuro: Alert and oriented X 3. Moves all extremities spontaneously. Psych:  Responds to questions appropriately with a normal affect.  ECG: .  Assessment / Plan:

## 2014-02-15 NOTE — Patient Instructions (Addendum)
Your physician recommends that you continue on your current medications as directed. Please refer to the Current Medication list given to you today.  Your physician wants you to follow-up in: 1 year with Dr. Acie Fredrickson.  You will receive a reminder letter in the mail two months in advance. If you don't receive a letter, please call our office to schedule the follow-up appointment.  Your physician has cleared you for knee surgery from a cardiac standpoint.

## 2014-02-15 NOTE — Assessment & Plan Note (Addendum)
Mrs. Fortune is doing well from a cardiac standpoint. He's not having any episodes of angina. We'll continue with her same medications.  She is scheduled to have knee surgery on Wednesday. From a cardiac standpoint, she should be at low risk for any cardiac palpitations.  She does have COPD .  This seem to be the major cause of her dyspnea.

## 2014-02-19 NOTE — H&P (Signed)
TOTAL KNEE ADMISSION H&P  Patient is being admitted for right total knee arthroplasty.  Subjective:  Chief Complaint:right knee pain.  HPI: [name], 74 y.o. female, has a history of pain and functional disability in the right knee due to arthritis and has failed non-surgical conservative treatments for greater than 12 weeks to includeNSAID's and/or analgesics, corticosteriod injections and viscosupplementation injections.  Onset of symptoms was gradual, starting 5 years ago with gradually worsening course since that time. The patient noted prior procedures on the knee to include  arthroscopy and menisectomy on the right knee(s).  Patient currently rates pain in the right knee(s) at 8 out of 10 with activity. Patient has night pain, worsening of pain with activity and weight bearing, pain that interferes with activities of daily living, pain with passive range of motion, crepitus and joint swelling.  Patient has evidence of subchondral cysts, periarticular osteophytes and joint space narrowing by imaging studies. There is no active infection.  Patient Active Problem List   Diagnosis Date Noted  . Obesity (BMI 30-39.9) 11/12/2013  . Chronic diastolic CHF (congestive heart failure) 11/17/2012  . Meniscus, lateral, anterior horn derangement 11/10/2011  . Medial meniscus, posterior horn derangement 11/10/2011  . Osteoarthritis of right knee 11/10/2011  . Vertigo 10/05/2011  . HTN (hypertension), benign 10/05/2011  . Coronary artery disease 08/27/2010  . MUSCLE CRAMPS 12/29/2009  . HEART ATTACK 11/19/2009  . Extrinsic asthma 11/19/2009  . GERD 11/19/2009  . DIABETES, TYPE 2 11/18/2009  . HYPERLIPIDEMIA 11/18/2009   Past Medical History  Diagnosis Date  . Coronary artery disease   . Diabetes mellitus   . Hypertension   . Hyperlipidemia   . Shoulder injury     resolved after shoulder surgery  . Chest pain   . Acute myocardial infarction, unspecified site, episode of care unspecified    . Unspecified asthma(493.90)   . PONV (postoperative nausea and vomiting)   . Anxiety   . GERD (gastroesophageal reflux disease)   . Upper respiratory symptom     02-13-14 "runny nose, mild cough,sniffles" -instructed to inform MD if symptoms worsens    Past Surgical History  Procedure Laterality Date  . Cardiac catheterization  07/23/2009    EF 60%  . Cardiac catheterization  10/11/2008  . Cardiac catheterization  03/01/2007    EF 75-80%  . Coronary artery bypass graft      SEVERELY DISEASED SAPHENOUS VEIN GRAFT TO THE RIGHT CORONARY ARTERY BUT WITH FAIRLY WELL PRESERVED FLOW TO THE DISTAL RIGHT CORONARY ARTERY FROM THE NATIVE CIRCULATION-RESTART  CATH IN JUNE 2000, REVEALS MILD/MODERATE  CAD WITH GOOD FLOW DOWN HER LAD  . US echocardiography  03/08/2008    EF 55-60%  . Cardiovascular stress test  11/15/2007    EF 60%  . Rotator cuff repair      right and left  . Tumor removed kidney    . Tonsillectomy      age 36  . Abdominal hysterectomy    . Appendectomy      came out with Hysterectomy  . Eye surgery      bilateral cataract surgery with lens implant   Current outpatient prescriptions:  doxazosin (CARDURA) 8 MG tablet, Take 8 mg by mouth daily., Disp: , Rfl: ;   famotidine (PEPCID) 20 MG tablet, Take 20 mg by mouth 2 (two) times daily., Disp: , Rfl: ;   furosemide (LASIX) 40 MG tablet, Take 40 mg by mouth., Disp: , Rfl: ;   omeprazole (PRILOSEC) 20 MG  capsule, Take 20 mg by mouth daily., Disp: , Rfl:  potassium chloride SA (K-DUR,KLOR-CON) 20 MEQ tablet, Take 20 mEq by mouth 2 (two) times daily., Disp: , Rfl: ;   albuterol (PROVENTIL HFA;VENTOLIN HFA) 108 (90 BASE) MCG/ACT inhaler, Inhale 2 puffs into the lungs every 6 (six) hours as needed. For shortness of breath., Disp: 1 Inhaler, Rfl: 0 albuterol (PROVENTIL) (2.5 MG/3ML) 0.083% nebulizer solution, Take 2.5 mg by nebulization every 6 (six) hours as needed. For shortness of breath., Disp: , Rfl: ;  ALPRAZolam (XANAX) 0.25 MG  tablet, Take 0.25 mg by mouth 2 (two) times daily as needed. , Disp: , Rfl: ;   baclofen (LIORESAL) 20 MG tablet, Take 1 tablet by mouth 2 (two) times daily., Disp: , Rfl:  beclomethasone (QVAR) 80 MCG/ACT inhaler, Inhale 3 puffs into the lungs 2 (two) times daily., Disp: 1 Inhaler, Rfl: 12;   Calcium & Magnesium Carbonates (MYLANTA PO), Take 1 tablet by mouth daily as needed. For indigestion., Disp: , Rfl: ;   canagliflozin (INVOKANA) 300 MG TABS tablet, Take 300 mg by mouth daily before breakfast., Disp: , Rfl: ;   Cholecalciferol (VITAMIN D) 2000 UNITS tablet, Take 2,000 Units by mouth daily. , Disp: , Rfl:  clopidogrel (PLAVIX) 75 MG tablet, Take 75 mg by mouth every morning., Disp: , Rfl: ;   diltiazem (CARDIZEM CD) 240 MG 24 hr capsule, Take 1 capsule (240 mg total) by mouth daily., Disp: 30 capsule, Rfl: 6;   Fluticasone Furoate-Vilanterol (BREO ELLIPTA) 200-25 MCG/INH AEPB, Inhale 1 puff into the lungs daily., Disp: 60 each, Rfl: 6;   HYDROmorphone (DILAUDID) 2 MG tablet, Take 1 tablet by mouth Once daily as needed., Disp: , Rfl:  insulin lispro protamine-insulin lispro (HUMALOG 75/25) (75-25) 100 UNIT/ML SUSP, Inject 20-45 Units into the skin 2 (two) times daily with a meal. 20 in am, 45 at dinner, Disp: , Rfl: ;   meclizine (ANTIVERT) 25 MG tablet, as needed., Disp: , Rfl: ;   metoprolol succinate (TOPROL-XL) 50 MG 24 hr tablet, Take 1 tablet by mouth daily., Disp: , Rfl: ;   Multiple Vitamin (MULTIVITAMIN WITH MINERALS) TABS, Take 1 tablet by mouth daily., Disp: , Rfl:  nitroGLYCERIN (NITROSTAT) 0.4 MG SL tablet, Place 0.4 mg under the tongue every 5 (five) minutes x 3 doses as needed. For chest pain., Disp: , Rfl:   Allergies  Allergen Reactions  . Amlodipine   . Banana Nausea And Vomiting    Stomach pumped  . Co Q10 [Coenzyme Q10]     Body cramps  . Codeine     Hallucinate, loose identity and don't know who I am  . Morphine Other (See Comments)    Can not function, it  immobilizes me   . Sulfonamide Derivatives Swelling    History  Substance Use Topics  . Smoking status: Former Smoker -- 0.50 packs/day for 25 years    Types: Cigarettes    Quit date: 02/09/1976  . Smokeless tobacco: Never Used  . Alcohol Use: No    Family History  Problem Relation Age of Onset  . Emphysema Paternal Uncle   . Heart disease Maternal Grandfather   . Esophageal cancer Brother      Review of Systems  Constitutional: Negative.   HENT: Negative.   Eyes: Negative.   Respiratory: Positive for shortness of breath. Negative for cough, hemoptysis, sputum production and wheezing.        SOB with exertion  Cardiovascular: Negative.   Gastrointestinal: Negative.  Genitourinary: Negative.   Musculoskeletal: Positive for back pain and joint pain. Negative for myalgias, falls and neck pain.       Right knee pain  Skin: Negative.   Neurological: Negative.   Endo/Heme/Allergies: Negative.   Psychiatric/Behavioral: Negative.     Objective:  Physical Exam  Constitutional: She is oriented to person, place, and time. She appears well-developed. No distress.  Overweight  HENT:  Head: Normocephalic and atraumatic.  Right Ear: External ear normal.  Left Ear: External ear normal.  Nose: Nose normal.  Mouth/Throat: Oropharynx is clear and moist.  Eyes: Conjunctivae and EOM are normal.  Neck: Normal range of motion. Neck supple.  Cardiovascular: Normal rate, regular rhythm, normal heart sounds and intact distal pulses.   No murmur heard. Respiratory: Effort normal and breath sounds normal. No respiratory distress. She has no wheezes.  GI: Soft. Bowel sounds are normal. She exhibits no distension. There is no tenderness.  Musculoskeletal:       Right hip: Normal.       Left hip: Normal.       Right knee: She exhibits decreased range of motion and swelling. She exhibits no effusion and no erythema. Tenderness found. Medial joint line and lateral joint line tenderness noted.        Left knee: Normal.       Right lower leg: She exhibits no tenderness and no swelling.       Left lower leg: She exhibits no tenderness and no swelling.  Neurological: She is alert and oriented to person, place, and time. She has normal strength and normal reflexes. No sensory deficit.  Skin: No rash noted. She is not diaphoretic. No erythema.  Psychiatric: She has a normal mood and affect. Her behavior is normal.    Vitals  Weight: 189 lb Height: 62in Body Surface Area: 1.87 m Body Mass Index: 34.57 kg/m  Pulse: 72 (Regular)  BP: 118/70 (Sitting, Left Arm, Standard)  Imaging Review Plain radiographs demonstrate severe degenerative joint disease of the right knee(s). The overall alignment ismild varus. The bone quality appears to be good for age and reported activity level.  Assessment/Plan:  End stage arthritis, right knee   The patient history, physical examination, clinical judgment of the provider and imaging studies are consistent with end stage degenerative joint disease of the right knee(s) and total knee arthroplasty is deemed medically necessary. The treatment options including medical management, injection therapy arthroscopy and arthroplasty were discussed at length. The risks and benefits of total knee arthroplasty were presented and reviewed. The risks due to aseptic loosening, infection, stiffness, patella tracking problems, thromboembolic complications and other imponderables were discussed. The patient acknowledged the explanation, agreed to proceed with the plan and consent was signed. Patient is being admitted for inpatient treatment for surgery, pain control, PT, OT, prophylactic antibiotics, VTE prophylaxis, progressive ambulation and ADL's and discharge planning. The patient is planning to be discharged home with home health services    Topical TXA Dilaudid post op pain management  PCP: [place]   Ardeen Jourdain, PA-C

## 2014-02-20 ENCOUNTER — Inpatient Hospital Stay (HOSPITAL_COMMUNITY): Payer: Medicare Other | Admitting: Anesthesiology

## 2014-02-20 ENCOUNTER — Encounter (HOSPITAL_COMMUNITY): Payer: Self-pay | Admitting: *Deleted

## 2014-02-20 ENCOUNTER — Inpatient Hospital Stay (HOSPITAL_COMMUNITY)
Admission: RE | Admit: 2014-02-20 | Discharge: 2014-02-22 | DRG: 470 | Disposition: A | Discharge status: Home Health | Payer: Medicare Other | Source: Ambulatory Visit | Attending: Orthopedic Surgery | Admitting: Orthopedic Surgery

## 2014-02-20 ENCOUNTER — Encounter (HOSPITAL_COMMUNITY)
Admission: RE | Disposition: A | Discharge status: Home Health | Payer: Self-pay | Source: Ambulatory Visit | Attending: Orthopedic Surgery

## 2014-02-20 DIAGNOSIS — Z87891 Personal history of nicotine dependence: Secondary | ICD-10-CM

## 2014-02-20 DIAGNOSIS — Z6834 Body mass index (BMI) 34.0-34.9, adult: Secondary | ICD-10-CM | POA: Diagnosis not present

## 2014-02-20 DIAGNOSIS — I252 Old myocardial infarction: Secondary | ICD-10-CM | POA: Diagnosis not present

## 2014-02-20 DIAGNOSIS — Z794 Long term (current) use of insulin: Secondary | ICD-10-CM | POA: Diagnosis not present

## 2014-02-20 DIAGNOSIS — E119 Type 2 diabetes mellitus without complications: Secondary | ICD-10-CM | POA: Diagnosis present

## 2014-02-20 DIAGNOSIS — E785 Hyperlipidemia, unspecified: Secondary | ICD-10-CM | POA: Diagnosis present

## 2014-02-20 DIAGNOSIS — J45909 Unspecified asthma, uncomplicated: Secondary | ICD-10-CM | POA: Diagnosis present

## 2014-02-20 DIAGNOSIS — M1711 Unilateral primary osteoarthritis, right knee: Principal | ICD-10-CM | POA: Diagnosis present

## 2014-02-20 DIAGNOSIS — Z951 Presence of aortocoronary bypass graft: Secondary | ICD-10-CM

## 2014-02-20 DIAGNOSIS — Z96659 Presence of unspecified artificial knee joint: Secondary | ICD-10-CM

## 2014-02-20 DIAGNOSIS — I10 Essential (primary) hypertension: Secondary | ICD-10-CM | POA: Diagnosis present

## 2014-02-20 DIAGNOSIS — M25561 Pain in right knee: Secondary | ICD-10-CM | POA: Diagnosis present

## 2014-02-20 DIAGNOSIS — I2510 Atherosclerotic heart disease of native coronary artery without angina pectoris: Secondary | ICD-10-CM | POA: Diagnosis present

## 2014-02-20 HISTORY — PX: TOTAL KNEE ARTHROPLASTY: SHX125

## 2014-02-20 LAB — GLUCOSE, CAPILLARY
GLUCOSE-CAPILLARY: 127 mg/dL — AB (ref 70–99)
GLUCOSE-CAPILLARY: 108 mg/dL — AB (ref 70–99)
Glucose-Capillary: 121 mg/dL — ABNORMAL HIGH (ref 70–99)
Glucose-Capillary: 117 mg/dL — ABNORMAL HIGH (ref 70–99)

## 2014-02-20 LAB — TYPE AND SCREEN
ABO/RH(D): A POS
Antibody Screen: NEGATIVE

## 2014-02-20 SURGERY — ARTHROPLASTY, KNEE, TOTAL
Anesthesia: General | Site: Knee | Laterality: Right

## 2014-02-20 MED ORDER — EPHEDRINE SULFATE 50 MG/ML IJ SOLN
INTRAMUSCULAR | Status: AC
  Filled 2014-02-20: qty 1

## 2014-02-20 MED ORDER — BACLOFEN 20 MG PO TABS
20.0000 mg | ORAL_TABLET | Freq: Two times a day (BID) | ORAL | Status: DC
  Administered 2014-02-20 – 2014-02-22 (×4): 20 mg via ORAL
  Filled 2014-02-20 (×5): qty 1

## 2014-02-20 MED ORDER — ACETAMINOPHEN 650 MG RE SUPP: 650.0000 mg | Freq: Four times a day (QID) | RECTAL | Status: DC | PRN

## 2014-02-20 MED ORDER — DOXAZOSIN MESYLATE 8 MG PO TABS
8.0000 mg | ORAL_TABLET | Freq: Every day | ORAL | Status: DC
  Administered 2014-02-21 – 2014-02-22 (×2): 8 mg via ORAL
  Filled 2014-02-20 (×3): qty 1

## 2014-02-20 MED ORDER — RIVAROXABAN 10 MG PO TABS
10.0000 mg | ORAL_TABLET | Freq: Every day | ORAL | Status: DC
  Administered 2014-02-21 – 2014-02-22 (×2): 10 mg via ORAL
  Filled 2014-02-20 (×3): qty 1

## 2014-02-20 MED ORDER — SODIUM CHLORIDE 0.9 % IJ SOLN
INTRAMUSCULAR | Status: DC | PRN
  Administered 2014-02-20: 20 mL

## 2014-02-20 MED ORDER — CEFAZOLIN SODIUM 1-5 GM-% IV SOLN
1.0000 g | Freq: Four times a day (QID) | INTRAVENOUS | Status: AC
  Administered 2014-02-21 (×2): 1 g via INTRAVENOUS
  Filled 2014-02-20 (×2): qty 50

## 2014-02-20 MED ORDER — INSULIN ASPART 100 UNIT/ML ~~LOC~~ SOLN
0.0000 [IU] | Freq: Three times a day (TID) | SUBCUTANEOUS | Status: DC
  Administered 2014-02-20 – 2014-02-22 (×3): 2 [IU] via SUBCUTANEOUS

## 2014-02-20 MED ORDER — METOPROLOL SUCCINATE ER 50 MG PO TB24
50.0000 mg | ORAL_TABLET | Freq: Every day | ORAL | Status: DC
  Administered 2014-02-21 – 2014-02-22 (×2): 50 mg via ORAL
  Filled 2014-02-20 (×2): qty 1

## 2014-02-20 MED ORDER — FENTANYL CITRATE 0.05 MG/ML IJ SOLN: 25.0000 ug | INTRAMUSCULAR | Status: DC | PRN

## 2014-02-20 MED ORDER — PHENOL 1.4 % MT LIQD
1.0000 | OROMUCOSAL | Status: DC | PRN
  Filled 2014-02-20: qty 177

## 2014-02-20 MED ORDER — FLUTICASONE FUROATE-VILANTEROL 200-25 MCG/INH IN AEPB: 1.0000 | INHALATION_SPRAY | Freq: Every day | RESPIRATORY_TRACT | Status: DC

## 2014-02-20 MED ORDER — LACTATED RINGERS IV SOLN
INTRAVENOUS | Status: DC
  Administered 2014-02-20 (×2): via INTRAVENOUS

## 2014-02-20 MED ORDER — CHLORHEXIDINE GLUCONATE 4 % EX LIQD: 60.0000 mL | Freq: Once | CUTANEOUS | Status: DC

## 2014-02-20 MED ORDER — METHOCARBAMOL 500 MG PO TABS: 500.0000 mg | ORAL_TABLET | Freq: Four times a day (QID) | ORAL | Status: DC | PRN

## 2014-02-20 MED ORDER — NITROGLYCERIN 0.4 MG SL SUBL: 0.4000 mg | SUBLINGUAL_TABLET | SUBLINGUAL | Status: DC | PRN

## 2014-02-20 MED ORDER — CEFAZOLIN SODIUM-DEXTROSE 2-3 GM-% IV SOLR
2.0000 g | INTRAVENOUS | Status: AC
  Administered 2014-02-20: 2 g via INTRAVENOUS

## 2014-02-20 MED ORDER — ACETAMINOPHEN 325 MG PO TABS
650.0000 mg | ORAL_TABLET | Freq: Four times a day (QID) | ORAL | Status: DC | PRN
Start: 2014-02-20 — End: 2014-02-22
  Administered 2014-02-20: 650 mg via ORAL
  Filled 2014-02-20: qty 2

## 2014-02-20 MED ORDER — MEPERIDINE HCL 50 MG/ML IJ SOLN: 6.2500 mg | INTRAMUSCULAR | Status: DC | PRN

## 2014-02-20 MED ORDER — PROMETHAZINE HCL 25 MG/ML IJ SOLN: 6.2500 mg | INTRAMUSCULAR | Status: DC | PRN

## 2014-02-20 MED ORDER — PROPOFOL 10 MG/ML IV BOLUS
INTRAVENOUS | Status: AC
  Filled 2014-02-20: qty 20

## 2014-02-20 MED ORDER — POTASSIUM CHLORIDE CRYS ER 20 MEQ PO TBCR
20.0000 meq | EXTENDED_RELEASE_TABLET | Freq: Two times a day (BID) | ORAL | Status: DC
  Administered 2014-02-21 – 2014-02-22 (×3): 20 meq via ORAL
  Filled 2014-02-20 (×5): qty 1

## 2014-02-20 MED ORDER — FERROUS SULFATE 325 (65 FE) MG PO TABS
325.0000 mg | ORAL_TABLET | Freq: Three times a day (TID) | ORAL | Status: DC
  Administered 2014-02-21 – 2014-02-22 (×2): 325 mg via ORAL
  Filled 2014-02-20 (×8): qty 1

## 2014-02-20 MED ORDER — BISACODYL 5 MG PO TBEC: 5.0000 mg | DELAYED_RELEASE_TABLET | Freq: Every day | ORAL | Status: DC | PRN

## 2014-02-20 MED ORDER — ALPRAZOLAM 0.25 MG PO TABS: 0.2500 mg | ORAL_TABLET | Freq: Two times a day (BID) | ORAL | Status: DC | PRN

## 2014-02-20 MED ORDER — NEOSTIGMINE METHYLSULFATE 10 MG/10ML IV SOLN
INTRAVENOUS | Status: DC | PRN
  Administered 2014-02-20: 4 mg via INTRAVENOUS

## 2014-02-20 MED ORDER — MIDAZOLAM HCL 2 MG/2ML IJ SOLN
INTRAMUSCULAR | Status: AC
  Filled 2014-02-20: qty 2

## 2014-02-20 MED ORDER — POLYETHYLENE GLYCOL 3350 17 G PO PACK: 17.0000 g | PACK | Freq: Every day | ORAL | Status: DC | PRN

## 2014-02-20 MED ORDER — LIDOCAINE HCL (CARDIAC) 20 MG/ML IV SOLN
INTRAVENOUS | Status: DC | PRN
  Administered 2014-02-20: 100 mg via INTRAVENOUS

## 2014-02-20 MED ORDER — TRANEXAMIC ACID 100 MG/ML IV SOLN
2000.0000 mg | Freq: Once | INTRAVENOUS | Status: DC
  Filled 2014-02-20: qty 20

## 2014-02-20 MED ORDER — HYDROMORPHONE HCL 1 MG/ML IJ SOLN
INTRAMUSCULAR | Status: AC
  Filled 2014-02-20: qty 1

## 2014-02-20 MED ORDER — FAMOTIDINE 20 MG PO TABS
20.0000 mg | ORAL_TABLET | Freq: Two times a day (BID) | ORAL | Status: DC
  Administered 2014-02-20 – 2014-02-22 (×4): 20 mg via ORAL
  Filled 2014-02-20 (×5): qty 1

## 2014-02-20 MED ORDER — ONDANSETRON HCL 4 MG PO TABS
4.0000 mg | ORAL_TABLET | Freq: Four times a day (QID) | ORAL | Status: DC | PRN
  Administered 2014-02-20 – 2014-02-21 (×3): 4 mg via ORAL
  Filled 2014-02-20 (×2): qty 1

## 2014-02-20 MED ORDER — BUPIVACAINE HCL (PF) 0.25 % IJ SOLN
INTRAMUSCULAR | Status: AC
  Filled 2014-02-20: qty 30

## 2014-02-20 MED ORDER — METOCLOPRAMIDE HCL 10 MG PO TABS: 10.0000 mg | ORAL_TABLET | Freq: Three times a day (TID) | ORAL | Status: DC | PRN

## 2014-02-20 MED ORDER — EPHEDRINE SULFATE 50 MG/ML IJ SOLN
INTRAMUSCULAR | Status: DC | PRN
  Administered 2014-02-20: 10 mg via INTRAVENOUS

## 2014-02-20 MED ORDER — ROCURONIUM BROMIDE 100 MG/10ML IV SOLN
INTRAVENOUS | Status: DC | PRN
  Administered 2014-02-20: 40 mg via INTRAVENOUS

## 2014-02-20 MED ORDER — DILTIAZEM HCL ER COATED BEADS 240 MG PO CP24
240.0000 mg | ORAL_CAPSULE | Freq: Every day | ORAL | Status: DC
  Administered 2014-02-21 – 2014-02-22 (×2): 240 mg via ORAL
  Filled 2014-02-20 (×2): qty 1

## 2014-02-20 MED ORDER — FLUTICASONE PROPIONATE HFA 44 MCG/ACT IN AERO
1.0000 | INHALATION_SPRAY | Freq: Two times a day (BID) | RESPIRATORY_TRACT | Status: DC
  Administered 2014-02-20 – 2014-02-22 (×4): 1 via RESPIRATORY_TRACT
  Filled 2014-02-20: qty 10.6

## 2014-02-20 MED ORDER — HYDROMORPHONE HCL 1 MG/ML IJ SOLN
0.2500 mg | INTRAMUSCULAR | Status: DC | PRN
  Administered 2014-02-20: 0.5 mg via INTRAVENOUS
  Administered 2014-02-20: 0.25 mg via INTRAVENOUS
  Administered 2014-02-20: 0.5 mg via INTRAVENOUS
  Administered 2014-02-20: 0.25 mg via INTRAVENOUS

## 2014-02-20 MED ORDER — HYDROMORPHONE HCL 2 MG PO TABS
2.0000 mg | ORAL_TABLET | ORAL | Status: DC | PRN
  Administered 2014-02-21 – 2014-02-22 (×4): 2 mg via ORAL
  Filled 2014-02-20 (×4): qty 1

## 2014-02-20 MED ORDER — ROCURONIUM BROMIDE 100 MG/10ML IV SOLN
INTRAVENOUS | Status: AC
  Filled 2014-02-20: qty 1

## 2014-02-20 MED ORDER — LACTATED RINGERS IV SOLN
INTRAVENOUS | Status: DC
  Administered 2014-02-20 – 2014-02-21 (×3): via INTRAVENOUS

## 2014-02-20 MED ORDER — FUROSEMIDE 40 MG PO TABS
40.0000 mg | ORAL_TABLET | Freq: Every day | ORAL | Status: DC
  Administered 2014-02-21: 40 mg via ORAL
  Filled 2014-02-20 (×2): qty 1

## 2014-02-20 MED ORDER — ONDANSETRON HCL 4 MG/2ML IJ SOLN
INTRAMUSCULAR | Status: AC
  Filled 2014-02-20: qty 2

## 2014-02-20 MED ORDER — MIDAZOLAM HCL 2 MG/2ML IJ SOLN
0.5000 mg | INTRAMUSCULAR | Status: DC
  Administered 2014-02-20: 0.25 mg via INTRAVENOUS

## 2014-02-20 MED ORDER — LIDOCAINE HCL (CARDIAC) 20 MG/ML IV SOLN
INTRAVENOUS | Status: AC
  Filled 2014-02-20: qty 5

## 2014-02-20 MED ORDER — HYDROMORPHONE HCL 1 MG/ML IJ SOLN
1.0000 mg | INTRAMUSCULAR | Status: DC | PRN
  Administered 2014-02-20 (×2): 1 mg via INTRAVENOUS
  Filled 2014-02-20 (×2): qty 1

## 2014-02-20 MED ORDER — THROMBIN 5000 UNITS EX SOLR
OROMUCOSAL | Status: DC | PRN
  Administered 2014-02-20: 5 mL via TOPICAL

## 2014-02-20 MED ORDER — TRANEXAMIC ACID 100 MG/ML IV SOLN
2000.0000 mg | INTRAVENOUS | Status: DC | PRN
  Administered 2014-02-20: 2000 mg via TOPICAL

## 2014-02-20 MED ORDER — ONDANSETRON HCL 4 MG/2ML IJ SOLN
4.0000 mg | Freq: Four times a day (QID) | INTRAMUSCULAR | Status: DC | PRN
  Administered 2014-02-20: 4 mg via INTRAVENOUS
  Filled 2014-02-20 (×2): qty 2

## 2014-02-20 MED ORDER — ONDANSETRON HCL 4 MG/2ML IJ SOLN
INTRAMUSCULAR | Status: DC | PRN
  Administered 2014-02-20: 4 mg via INTRAVENOUS

## 2014-02-20 MED ORDER — METOPROLOL SUCCINATE ER 50 MG PO TB24
50.0000 mg | ORAL_TABLET | Freq: Every day | ORAL | Status: DC
  Administered 2014-02-20: 50 mg via ORAL
  Filled 2014-02-20: qty 1

## 2014-02-20 MED ORDER — BUPIVACAINE HCL (PF) 0.25 % IJ SOLN
INTRAMUSCULAR | Status: DC | PRN
  Administered 2014-02-20: 20 mL

## 2014-02-20 MED ORDER — PROPOFOL 10 MG/ML IV BOLUS
INTRAVENOUS | Status: DC | PRN
  Administered 2014-02-20: 100 mg via INTRAVENOUS

## 2014-02-20 MED ORDER — ALBUTEROL SULFATE HFA 108 (90 BASE) MCG/ACT IN AERS: 2.0000 | INHALATION_SPRAY | RESPIRATORY_TRACT | Status: DC

## 2014-02-20 MED ORDER — MENTHOL 3 MG MT LOZG
1.0000 | LOZENGE | OROMUCOSAL | Status: DC | PRN
  Filled 2014-02-20: qty 9

## 2014-02-20 MED ORDER — FENTANYL CITRATE 0.05 MG/ML IJ SOLN
INTRAMUSCULAR | Status: DC | PRN
  Administered 2014-02-20 (×5): 50 ug via INTRAVENOUS

## 2014-02-20 MED ORDER — SODIUM CHLORIDE 0.9 % IR SOLN
Status: AC
  Filled 2014-02-20: qty 1

## 2014-02-20 MED ORDER — GLYCOPYRROLATE 0.2 MG/ML IJ SOLN
INTRAMUSCULAR | Status: DC | PRN
  Administered 2014-02-20: 0.6 mg via INTRAVENOUS

## 2014-02-20 MED ORDER — METOCLOPRAMIDE HCL 5 MG/ML IJ SOLN
10.0000 mg | Freq: Three times a day (TID) | INTRAMUSCULAR | Status: DC | PRN
  Administered 2014-02-20: 10 mg via INTRAVENOUS
  Filled 2014-02-20 (×2): qty 2

## 2014-02-20 MED ORDER — ALUM & MAG HYDROXIDE-SIMETH 200-200-20 MG/5ML PO SUSP
30.0000 mL | ORAL | Status: DC | PRN
Start: 2014-02-20 — End: 2014-02-22

## 2014-02-20 MED ORDER — SODIUM CHLORIDE 0.9 % IJ SOLN
INTRAMUSCULAR | Status: AC
  Filled 2014-02-20: qty 10

## 2014-02-20 MED ORDER — CEFAZOLIN SODIUM-DEXTROSE 2-3 GM-% IV SOLR
INTRAVENOUS | Status: AC
  Filled 2014-02-20: qty 50

## 2014-02-20 MED ORDER — PROMETHAZINE HCL 25 MG/ML IJ SOLN
6.2500 mg | Freq: Four times a day (QID) | INTRAMUSCULAR | Status: DC | PRN
  Administered 2014-02-21: 12.5 mg via INTRAVENOUS
  Filled 2014-02-20: qty 1

## 2014-02-20 MED ORDER — ALBUTEROL SULFATE (2.5 MG/3ML) 0.083% IN NEBU: 2.5000 mg | INHALATION_SOLUTION | Freq: Four times a day (QID) | RESPIRATORY_TRACT | Status: DC | PRN

## 2014-02-20 MED ORDER — PANTOPRAZOLE SODIUM 40 MG PO TBEC
40.0000 mg | DELAYED_RELEASE_TABLET | Freq: Every day | ORAL | Status: DC
  Administered 2014-02-21 – 2014-02-22 (×2): 40 mg via ORAL
  Filled 2014-02-20 (×2): qty 1

## 2014-02-20 MED ORDER — THROMBIN 5000 UNITS EX SOLR
CUTANEOUS | Status: AC
Start: 2014-02-20 — End: 2014-02-20
  Filled 2014-02-20: qty 5000

## 2014-02-20 MED ORDER — SODIUM CHLORIDE 0.9 % IJ SOLN
INTRAMUSCULAR | Status: AC
  Filled 2014-02-20: qty 50

## 2014-02-20 MED ORDER — BUPIVACAINE LIPOSOME 1.3 % IJ SUSP
20.0000 mL | Freq: Once | INTRAMUSCULAR | Status: AC
  Administered 2014-02-20: 40 mL
  Filled 2014-02-20: qty 20

## 2014-02-20 MED ORDER — SODIUM CHLORIDE 0.9 % IR SOLN
Status: DC | PRN
  Administered 2014-02-20: 500 mL

## 2014-02-20 MED ORDER — FENTANYL CITRATE 0.05 MG/ML IJ SOLN
INTRAMUSCULAR | Status: AC
  Filled 2014-02-20: qty 5

## 2014-02-20 MED ORDER — CANAGLIFLOZIN 300 MG PO TABS
300.0000 mg | ORAL_TABLET | Freq: Every day | ORAL | Status: DC
  Administered 2014-02-21 – 2014-02-22 (×2): 300 mg via ORAL
  Filled 2014-02-20 (×3): qty 300

## 2014-02-20 MED ORDER — METHOCARBAMOL 1000 MG/10ML IJ SOLN
500.0000 mg | Freq: Four times a day (QID) | INTRAVENOUS | Status: DC | PRN
  Administered 2014-02-20: 500 mg via INTRAVENOUS
  Filled 2014-02-20 (×2): qty 5

## 2014-02-20 MED ORDER — FLEET ENEMA 7-19 GM/118ML RE ENEM: 1.0000 | ENEMA | Freq: Once | RECTAL | Status: AC | PRN

## 2014-02-20 SURGICAL SUPPLY — 70 items
BAG SPEC THK2 15X12 ZIP CLS (MISCELLANEOUS)
BAG ZIPLOCK 12X15 (MISCELLANEOUS) IMPLANT
BANDAGE ELASTIC 4 VELCRO ST LF (GAUZE/BANDAGES/DRESSINGS) ×2 IMPLANT
BANDAGE ELASTIC 6 VELCRO ST LF (GAUZE/BANDAGES/DRESSINGS) ×2 IMPLANT
BANDAGE ESMARK 6X9 LF (GAUZE/BANDAGES/DRESSINGS) ×1 IMPLANT
BLADE SAG 18X100X1.27 (BLADE) ×2 IMPLANT
BLADE SAW SGTL 11.0X1.19X90.0M (BLADE) ×2 IMPLANT
BNDG CMPR 9X6 STRL LF SNTH (GAUZE/BANDAGES/DRESSINGS) ×1
BNDG ESMARK 6X9 LF (GAUZE/BANDAGES/DRESSINGS) ×2
BONE CEMENT GENTAMICIN (Cement) ×4 IMPLANT
CAP KNEE TOTAL 3 SIGMA ×1 IMPLANT
CEMENT BONE GENTAMICIN 40 (Cement) ×2 IMPLANT
CUFF TOURN SGL QUICK 34 (TOURNIQUET CUFF) ×2
CUFF TRNQT CYL 34X4X40X1 (TOURNIQUET CUFF) ×1 IMPLANT
DRAPE EXTREMITY T 121X128X90 (DRAPE) ×2 IMPLANT
DRAPE INCISE IOBAN 66X45 STRL (DRAPES) IMPLANT
DRAPE POUCH INSTRU U-SHP 10X18 (DRAPES) ×2 IMPLANT
DRAPE U-SHAPE 47X51 STRL (DRAPES) ×2 IMPLANT
DRSG AQUACEL AG ADV 3.5X10 (GAUZE/BANDAGES/DRESSINGS) ×2 IMPLANT
DRSG PAD ABDOMINAL 8X10 ST (GAUZE/BANDAGES/DRESSINGS) IMPLANT
DRSG TEGADERM 4X4.75 (GAUZE/BANDAGES/DRESSINGS) ×2 IMPLANT
DURAPREP 26ML APPLICATOR (WOUND CARE) ×2 IMPLANT
ELECT REM PT RETURN 9FT ADLT (ELECTROSURGICAL) ×2
ELECTRODE REM PT RTRN 9FT ADLT (ELECTROSURGICAL) ×1 IMPLANT
EVACUATOR 1/8 PVC DRAIN (DRAIN) ×2 IMPLANT
FACESHIELD WRAPAROUND (MASK) ×10 IMPLANT
FACESHIELD WRAPAROUND OR TEAM (MASK) ×5 IMPLANT
GAUZE SPONGE 2X2 8PLY STRL LF (GAUZE/BANDAGES/DRESSINGS) ×1 IMPLANT
GLOVE BIOGEL PI IND STRL 6.5 (GLOVE) ×1 IMPLANT
GLOVE BIOGEL PI IND STRL 8 (GLOVE) ×1 IMPLANT
GLOVE BIOGEL PI INDICATOR 6.5 (GLOVE) ×6
GLOVE BIOGEL PI INDICATOR 8 (GLOVE) ×1
GLOVE ECLIPSE 8.0 STRL XLNG CF (GLOVE) ×4 IMPLANT
GLOVE SURG SS PI 6.5 STRL IVOR (GLOVE) ×2 IMPLANT
GOWN STRL REUS W/TWL LRG LVL3 (GOWN DISPOSABLE) ×4 IMPLANT
GOWN STRL REUS W/TWL XL LVL3 (GOWN DISPOSABLE) ×2 IMPLANT
HANDPIECE INTERPULSE COAX TIP (DISPOSABLE) ×2
IMMOBILIZER KNEE 20 (SOFTGOODS) ×2
IMMOBILIZER KNEE 20 THIGH 36 (SOFTGOODS) ×1 IMPLANT
KIT BASIN OR (CUSTOM PROCEDURE TRAY) ×2 IMPLANT
LIQUID BAND (GAUZE/BANDAGES/DRESSINGS) ×2 IMPLANT
MANIFOLD NEPTUNE II (INSTRUMENTS) ×2 IMPLANT
NDL SAFETY ECLIPSE 18X1.5 (NEEDLE) IMPLANT
NEEDLE HYPO 18GX1.5 SHARP (NEEDLE) ×6
NEEDLE HYPO 22GX1.5 SAFETY (NEEDLE) ×4 IMPLANT
NS IRRIG 1000ML POUR BTL (IV SOLUTION) IMPLANT
PACK TOTAL JOINT (CUSTOM PROCEDURE TRAY) ×2 IMPLANT
PADDING CAST COTTON 6X4 STRL (CAST SUPPLIES) IMPLANT
POSITIONER SURGICAL ARM (MISCELLANEOUS) ×2 IMPLANT
SET HNDPC FAN SPRY TIP SCT (DISPOSABLE) ×1 IMPLANT
SET PAD KNEE POSITIONER (MISCELLANEOUS) ×2 IMPLANT
SPONGE GAUZE 2X2 STER 10/PKG (GAUZE/BANDAGES/DRESSINGS) ×1
SPONGE LAP 18X18 X RAY DECT (DISPOSABLE) ×1 IMPLANT
SPONGE SURGIFOAM ABS GEL 100 (HEMOSTASIS) ×2 IMPLANT
STAPLER VISISTAT 35W (STAPLE) IMPLANT
SUCTION FRAZIER 12FR DISP (SUCTIONS) ×2 IMPLANT
SUT BONE WAX W31G (SUTURE) ×2 IMPLANT
SUT MNCRL AB 4-0 PS2 18 (SUTURE) ×2 IMPLANT
SUT VIC AB 1 CT1 27 (SUTURE) ×4
SUT VIC AB 1 CT1 27XBRD ANTBC (SUTURE) ×2 IMPLANT
SUT VIC AB 2-0 CT1 27 (SUTURE) ×8
SUT VIC AB 2-0 CT1 TAPERPNT 27 (SUTURE) ×3 IMPLANT
SUT VLOC 180 0 24IN GS25 (SUTURE) ×2 IMPLANT
SYR 20CC LL (SYRINGE) ×5 IMPLANT
TOWEL OR 17X26 10 PK STRL BLUE (TOWEL DISPOSABLE) ×2 IMPLANT
TOWEL OR NON WOVEN STRL DISP B (DISPOSABLE) ×1 IMPLANT
TOWER CARTRIDGE SMART MIX (DISPOSABLE) ×2 IMPLANT
TRAY FOLEY CATH 14FRSI W/METER (CATHETERS) ×2 IMPLANT
WATER STERILE IRR 1500ML POUR (IV SOLUTION) ×2 IMPLANT
WRAP KNEE MAXI GEL POST OP (GAUZE/BANDAGES/DRESSINGS) ×3 IMPLANT

## 2014-02-20 NOTE — Op Note (Signed)
NAME:  [name], [name]                 ACCOUNT NO.:  [id number]  MEDICAL RECORD NO.:  [id number]  LOCATION:  [facility]                         FACILITY:  [facility]  PHYSICIAN:  Kipp Brood. Jupiter Kabir, M.D.DATE OF BIRTH:  09-25-1940  DATE OF PROCEDURE:  02/20/2014 DATE OF DISCHARGE:                              OPERATIVE REPORT   SURGEON:  Kipp Brood. Gladstone Lighter, M.D.  ASSISTANT:  Ardeen Jourdain, [place]  PREOPERATIVE DIAGNOSIS:  Bone on bone primary osteoarthritis of the right knee.  POSTOPERATIVE DIAGNOSIS:  Bone on bone primary osteoarthritis of the right knee.  OPERATION:  Right total knee arthroplasty utilizing the DePuy system.  I utilized gentamicin in the cement.  The size used was a size 3 right femoral component posterior cruciate sacrificing type.  The tray was a size 2-1/2, the insert was a size 3,10 mm thickness.  The patella was a size 38 with 3 pegs.  DESCRIPTION OF PROCEDURE:  Under general anesthesia, routine orthopedic prep and draping of the right lower extremity was carried out.  The right leg was exsanguinated with an Esmarch.  Tourniquet was elevated at 325 mmHg.  The leg was placed in a [facility].  The appropriate time-out was first carried out.  I also marked the appropriate right knee in the holding area.  Incision then was made with the knee flexed. The incision was an anterior approach with 2 flaps created.  I then carried out a median parapatellar incision.  I reflected the patella laterally.  With the knee flexed, I did medial and lateral meniscectomies and excised the anterior and posterior cruciate ligaments.  Following this, I did a synovectomy.  I then made initial drill hole in the intercondylar notch in usual fashion.  The canal finder then was inserted. I thoroughly irrigated out the canal.  I then inserted my next device and 13 mm thickness was removed from the distal femur.  At this particular time, I measured the femur to be a size 3.  I did my  anterior-posterior and chamfering cuts to a size 3 for the distal femur.  Attention then was directed to the tibia.  We then removed 6 mm thickness off the affected medial side of the tibia by utilizing the intramedullary guide.  At this particular time, I then removed all loose spurs.  I then inserted my lamina spreaders, removed the posterior spurs from the femur, thoroughly irrigated out the knee, and then I cut my keel cut of the proximal tibia.  Following that, I then did my notch cut on the distal femur.  I then inserted my trial components, reduced the knee.  I then did a resurfacing procedure on the patella for a size 38 patella.  Three drill holes were made in the articular surface of the patella.  All trial components were removed.  I thoroughly water picked out the knee and cemented all 3 components in simultaneously.  After it hardened, I removed all loose pieces of cement.  I water picked out the knee, to make sure there was no other pieces of cement loose.  I then went through my trial again and selected finally a 10 mm thickness size 3  rotating platform insert and reduced the knee.  We had good stability, good flexion and extension.  I inserted a Hemovac drain, closed the knee in layers in usual fashion.  I injected 20 mL of 0.25% Marcaine plain into the soft tissue structures first.  A Hemovac drain was inserted.  As I mentioned, the wound was closed in usual fashion.  Sterile dressings were applied.  She had 2 g of IV Ancef preop and also tranexamic acid.          ______________________________ Kipp Brood. Gladstone Lighter, M.D.     RAG/MEDQ  D:  02/20/2014  T:  02/20/2014  Job:  [id number]

## 2014-02-20 NOTE — Anesthesia Preprocedure Evaluation (Addendum)
Anesthesia Evaluation  Patient identified by MRN, date of birth, ID band Patient awake    Reviewed: Allergy & Precautions, H&P , NPO status , Patient's Chart, lab work & pertinent test results  History of Anesthesia Complications (+) PONV  Airway Mallampati: II  TM Distance: <3 FB Neck ROM: Full    Dental no notable dental hx. (+) Edentulous Upper, Edentulous Lower   Pulmonary asthma , former smoker,  breath sounds clear to auscultation  + decreased breath sounds      Cardiovascular hypertension, Pt. on medications + CAD and + CABG (2010) Rhythm:Regular Rate:Normal     Neuro/Psych TIAnegative psych ROS   GI/Hepatic negative GI ROS, Neg liver ROS,   Endo/Other  diabetes, Insulin DependentMorbid obesity  Renal/GU negative Renal ROS  negative genitourinary   Musculoskeletal negative musculoskeletal ROS (+)   Abdominal   Peds negative pediatric ROS (+)  Hematology negative hematology ROS (+)   Anesthesia Other Findings   Reproductive/Obstetrics negative OB ROS                            Anesthesia Physical  Anesthesia Plan  ASA: III  Anesthesia Plan: General   Post-op Pain Management:    Induction: Intravenous  Airway Management Planned: Oral ETT  Additional Equipment:   Intra-op Plan:   Post-operative Plan:   Informed Consent: I have reviewed the patients History and Physical, chart, labs and discussed the procedure including the risks, benefits and alternatives for the proposed anesthesia with the patient or authorized representative who has indicated his/her understanding and acceptance.   Dental advisory given  Plan Discussed with: CRNA and Surgeon  Anesthesia Plan Comments:         Anesthesia Quick Evaluation

## 2014-02-20 NOTE — Progress Notes (Signed)
Utilization review completed.  

## 2014-02-20 NOTE — Brief Op Note (Signed)
02/20/2014  10:21 AM  PATIENT:  [name]  74 y.o. female  PRE-OPERATIVE DIAGNOSIS:  Right Knee Primary Osteorthritis  POST-OPERATIVE DIAGNOSIS:  right knee Primary  osteoarthritis  PROCEDURE:  Procedure(s): RIGHT TOTAL KNEE ARTHROPLASTY (Right)  SURGEON:  Surgeon(s) and Role:    * Tobi Bastos, MD - Primary  PHYSICIAN ASSISTANT: Ardeen Jourdain PA  ASSISTANTS:[place]  ANESTHESIA:   general  EBL:  Total I/O In: -  Out: 200 [Urine:200]  BLOOD ADMINISTERED:none  DRAINS: (One) Hemovact drain(s) in the Right Knee with  Suction Open   LOCAL MEDICATIONS USED:  MARCAINE 20cc of 0.25% Plain and 20cc of Exparel mixed with 20cc of Normal Saline.    SPECIMEN:  No Specimen  DISPOSITION OF SPECIMEN:  N/A  COUNTS:  YES  TOURNIQUET:  * Missing tourniquet times found for documented tourniquets in log:  [id number] *  DICTATION: .Other Dictation: Dictation Number [id number]  PLAN OF CARE: Admit to inpatient   PATIENT DISPOSITION:  Stable in OR   Delay start of Pharmacological VTE agent (>24hrs) due to surgical blood loss or risk of bleeding: yes

## 2014-02-20 NOTE — Interval H&P Note (Signed)
History and Physical Interval Note:  02/20/2014 8:09 AM  [name]  has presented today for surgery, with the diagnosis of Right Knee Osteorthritis  The various methods of treatment have been discussed with the patient and family. After consideration of risks, benefits and other options for treatment, the patient has consented to  Procedure(s): RIGHT TOTAL KNEE ARTHROPLASTY (Right) as a surgical intervention .  The patient's history has been reviewed, patient examined, no change in status, stable for surgery.  I have reviewed the patient's chart and labs.  Questions were answered to the patient's satisfaction.     Hayze Gazda A

## 2014-02-20 NOTE — Anesthesia Postprocedure Evaluation (Signed)
  Anesthesia Post-op Note  Patient: [name]  Procedure(s) Performed: Procedure(s) (LRB): RIGHT TOTAL KNEE ARTHROPLASTY (Right)  Patient Location: PACU  Anesthesia Type: General  Level of Consciousness: awake and alert   Airway and Oxygen Therapy: Patient Spontanous Breathing  Post-op Pain: mild  Post-op Assessment: Post-op Vital signs reviewed, Patient's Cardiovascular Status Stable, Respiratory Function Stable, Patent Airway and No signs of Nausea or vomiting  Last Vitals:  Filed Vitals:   02/20/14 1450  BP: 115/61  Pulse: 53  Temp: 36.5 C  Resp: 16    Post-op Vital Signs: stable   Complications: No apparent anesthesia complications

## 2014-02-20 NOTE — Transfer of Care (Signed)
Immediate Anesthesia Transfer of Care Note  Patient: [name]  Procedure(s) Performed: Procedure(s): RIGHT TOTAL KNEE ARTHROPLASTY (Right)  Patient Location: PACU  Anesthesia Type:General  Level of Consciousness: awake and alert   Airway & Oxygen Therapy: Patient Spontanous Breathing and Patient connected to face mask oxygen  Post-op Assessment: Report given to PACU RN and Post -op Vital signs reviewed and stable  Post vital signs: Reviewed and stable  Complications: No apparent anesthesia complications

## 2014-02-21 ENCOUNTER — Encounter (HOSPITAL_COMMUNITY): Payer: Self-pay | Admitting: Orthopedic Surgery

## 2014-02-21 LAB — CBC
HCT: 30.4 % — ABNORMAL LOW (ref 36.0–46.0)
Hemoglobin: 9.2 g/dL — ABNORMAL LOW (ref 12.0–15.0)
MCH: 24.5 pg — AB (ref 26.0–34.0)
MCHC: 30.3 g/dL (ref 30.0–36.0)
MCV: 80.9 fL (ref 78.0–100.0)
Platelets: 229 10*3/uL (ref 150–400)
RBC: 3.76 MIL/uL — AB (ref 3.87–5.11)
RDW: 18.1 % — ABNORMAL HIGH (ref 11.5–15.5)
WBC: 9.6 10*3/uL (ref 4.0–10.5)

## 2014-02-21 LAB — BASIC METABOLIC PANEL
ANION GAP: 9 (ref 5–15)
BUN: 16 mg/dL (ref 6–23)
CALCIUM: 8.1 mg/dL — AB (ref 8.4–10.5)
CHLORIDE: 98 meq/L (ref 96–112)
CO2: 28 mmol/L (ref 19–32)
Creatinine, Ser: 0.54 mg/dL (ref 0.50–1.10)
GFR calc non Af Amer: >90 mL/min (ref >90)
Glucose, Bld: 113 mg/dL — ABNORMAL HIGH (ref 70–99)
POTASSIUM: 4 mmol/L (ref 3.5–5.1)
SODIUM: 135 mmol/L (ref 135–145)

## 2014-02-21 LAB — GLUCOSE, CAPILLARY
GLUCOSE-CAPILLARY: 114 mg/dL — AB (ref 70–99)
Glucose-Capillary: 122 mg/dL — ABNORMAL HIGH (ref 70–99)
Glucose-Capillary: 140 mg/dL — ABNORMAL HIGH (ref 70–99)
Glucose-Capillary: 123 mg/dL — ABNORMAL HIGH (ref 70–99)

## 2014-02-21 NOTE — Discharge Instructions (Signed)
Information on my medicine - XARELTO (Rivaroxaban)  This medication education was reviewed with me or my healthcare representative as part of my discharge preparation.  The pharmacist that spoke with me during my hospital stay was:  Angela Adam [facility]  Why was Xarelto prescribed for you? Xarelto was prescribed for you to reduce the risk of blood clots forming after orthopedic surgery. The medical term for these abnormal blood clots is venous thromboembolism (VTE).  What do you need to know about xarelto ? Take your Xarelto ONCE DAILY at the same time every day. You may take it either with or without food.  If you have difficulty swallowing the tablet whole, you may crush it and mix in applesauce just prior to taking your dose.  Take Xarelto exactly as prescribed by your doctor and DO NOT stop taking Xarelto without talking to the doctor who prescribed the medication.  Stopping without other VTE prevention medication to take the place of Xarelto may increase your risk of developing a clot.  After discharge, you should have regular check-up appointments with your healthcare provider that is prescribing your Xarelto.    What do you do if you miss a dose? If you miss a dose, take it as soon as you remember on the same day then continue your regularly scheduled once daily regimen the next day. Do not take two doses of Xarelto on the same day.   Important Safety Information A possible side effect of Xarelto is bleeding. You should call your healthcare provider right away if you experience any of the following: ? Bleeding from an injury or your nose that does not stop. ? Unusual colored urine (red or dark brown) or unusual colored stools (red or black). ? Unusual bruising for unknown reasons. ? A serious fall or if you hit your head (even if there is no bleeding).  Some medicines may interact with Xarelto and might increase your risk of bleeding while on Xarelto. To help avoid  this, consult your healthcare provider or pharmacist prior to using any new prescription or non-prescription medications, including herbals, vitamins, non-steroidal anti-inflammatory drugs (NSAIDs) and supplements.  This website has more information on Xarelto: [email or web address]

## 2014-02-21 NOTE — Evaluation (Signed)
Physical Therapy Evaluation Patient Details Name: [name] MRN: [id number] DOB: 05-30-1940 Today's Date: 02/21/2014   History of Present Illness  R TKR  Clinical Impression  Pt s/p R TKR presents with decreased R LE strength/ROM and post op pain limiting functional mobility.  Pt should progress to d.c home with family assist and HHPT follow up.    Follow Up Recommendations Home health PT    Equipment Recommendations  Rolling walker with 5" wheels    Recommendations for Other Services OT consult     Precautions / Restrictions Precautions Precautions: Knee;Fall Required Braces or Orthoses: Knee Immobilizer - Right Knee Immobilizer - Right: Discontinue once straight leg raise with < 10 degree lag Restrictions Weight Bearing Restrictions: No Other Position/Activity Restrictions: WBAT      Mobility  Bed Mobility Overal bed mobility: Needs Assistance;+2 for physical assistance Bed Mobility: Supine to Sit     Supine to sit: Min assist;Mod assist;+2 for physical assistance;+2 for safety/equipment     General bed mobility comments: cues for sequence and use of L LE to self assist  Transfers Overall transfer level: Needs assistance Equipment used: Rolling walker (2 wheeled) Transfers: Sit to/from Stand Sit to Stand: Mod assist;+2 physical assistance;+2 safety/equipment         General transfer comment: cues for LE management and use of UEs to self assist  Ambulation/Gait Ambulation/Gait assistance: Min assist;Mod assist;+2 physical assistance;+2 safety/equipment Ambulation Distance (Feet): 18 Feet Assistive device: Rolling walker (2 wheeled) Gait Pattern/deviations: Step-to pattern;Decreased step length - right;Decreased step length - left;Shuffle;Trunk flexed Gait velocity: decr   General Gait Details: cues for sequence, posture and position from RW; physical assist for support/balance, to manage RW and to initially advance R LE  Stairs             Wheelchair Mobility    Modified Rankin (Stroke Patients Only)       Balance                                             Pertinent Vitals/Pain Pain Assessment: 0-10 Pain Score: 7  Pain Location: R knee Pain Descriptors / Indicators: Aching;Sore Pain Intervention(s): Limited activity within patient's tolerance;Monitored during session;Premedicated before session;Ice applied    Home Living Family/patient expects to be discharged to:: Private residence Living Arrangements: Alone Available Help at Discharge: Family Type of Home: House Home Access: Level entry     Home Layout: One level Home Equipment: Cane - quad      Prior Function Level of Independence: Independent;Independent with assistive device(s)               Hand Dominance   Dominant Hand: Right    Extremity/Trunk Assessment   Upper Extremity Assessment: Overall WFL for tasks assessed           Lower Extremity Assessment: RLE deficits/detail RLE Deficits / Details: 2/5 quads with AAROM at knee -10 - 20 with muscle guarding        Communication   Communication: No difficulties  Cognition Arousal/Alertness: Awake/alert Behavior During Therapy: WFL for tasks assessed/performed Overall Cognitive Status: Within Functional Limits for tasks assessed                      General Comments      Exercises Total Joint Exercises Ankle Circles/Pumps: AROM;Both;15 reps;Supine Quad Sets: AROM;Both;10 reps;Supine Heel Slides: AAROM;Right;10  reps;Supine Straight Leg Raises: AAROM;Right;10 reps;Supine      Assessment/Plan    PT Assessment Patient needs continued PT services  PT Diagnosis Difficulty walking   PT Problem List Decreased strength;Decreased range of motion;Decreased activity tolerance;Decreased mobility;Decreased knowledge of use of DME;Obesity;Pain  PT Treatment Interventions DME instruction;Gait training;Functional mobility training;Therapeutic  activities;Therapeutic exercise;Patient/family education   PT Goals (Current goals can be found in the Care Plan section) Acute Rehab PT Goals Patient Stated Goal: Resume previous lifestyle with decreased pain PT Goal Formulation: With patient Time For Goal Achievement: 02/28/14 Potential to Achieve Goals: Good    Frequency 7X/week   Barriers to discharge        Co-evaluation               End of Session Equipment Utilized During Treatment: Gait belt;Right knee immobilizer Activity Tolerance: Patient tolerated treatment well;Patient limited by fatigue Patient left: in chair;with call bell/phone within reach Nurse Communication: Mobility status         Time: 6222-9798 PT Time Calculation (min) (ACUTE ONLY): 31 min   Charges:   PT Evaluation $Initial PT Evaluation Tier I: 1 Procedure PT Treatments $Gait Training: 8-22 mins $Therapeutic Exercise: 8-22 mins   PT G Codes:        [name] 02-26-14, 12:40 PM

## 2014-02-21 NOTE — Progress Notes (Signed)
OT  Note  Patient Details Name: [name] MRN: [id number] DOB: 1941-01-10   Cancelled Treatment:    Reason Eval/Treat Not Completed: Other (comment) Spoke with PT in regards to pts progress.  Pt moving slowly.  Will perform OT eval next day as pt improving. Betsy Pries 02/21/2014, 12:03 PM

## 2014-02-21 NOTE — Progress Notes (Signed)
Subjective: 1 Day Post-Op Procedure(s) (LRB): RIGHT TOTAL KNEE ARTHROPLASTY (Right) Patient reports pain as 3 on 0-10 scale. Doing fine. Hemovac DCd   Objective: Vital signs in last 24 hours: Temp:  [97.4 F (36.3 C)-99.4 F (37.4 C)] 99.4 F (37.4 C) (01/14 0423) Pulse Rate:  [53-100] 91 (01/14 0423) Resp:  [8-20] 20 (01/14 0423) BP: (103-126)/(45-70) 115/45 mmHg (01/14 0423) SpO2:  [92 %-100 %] 96 % (01/14 0423)  Intake/Output from previous day: 01/13 0701 - 01/14 0700 In: 3418.8 [P.O.:240; I.V.:3023.8; IV Piggyback:155] Out: 1725 [Urine:1525; Drains:200] Intake/Output this shift:     Recent Labs  02/21/14 0355  HGB 9.2*    Recent Labs  02/21/14 0355  WBC 9.6  RBC 3.76*  HCT 30.4*  PLT 229    Recent Labs  02/21/14 0355  NA 135  K 4.0  CL 98  CO2 28  BUN 16  CREATININE 0.54  GLUCOSE 113*  CALCIUM 8.1*   No results for input(s): LABPT, INR in the last 72 hours.  Dorsiflexion/Plantar flexion intact Compartment soft  Assessment/Plan: 1 Day Post-Op Procedure(s) (LRB): RIGHT TOTAL KNEE ARTHROPLASTY (Right) Up with therapy  Joanmarie Tsang A 02/21/2014, 7:26 AM

## 2014-02-21 NOTE — Progress Notes (Signed)
PT Cancellation Note  Patient Details Name: [name] MRN: [id number] DOB: 1940-09-23   Cancelled Treatment:     Attempted PT pm tx x2 - Deferred 2* pt nausea and pain.  Will follow in am.   Davetta Olliff 02/21/2014, 4:01 PM

## 2014-02-21 NOTE — Care Management Note (Signed)
    Page 1 of 2   02/21/2014     1:03:10 PM CARE MANAGEMENT NOTE 02/21/2014  Patient:  [name], [name]   Account Number:  [id number]  Date Initiated:  02/21/2014  Documentation initiated by:  [facility]  Subjective/Objective Assessment:   [id number] TOTAL KNEE ARTHROPLASTY (Right)     Action/Plan:   discharge planning   Anticipated DC Date:  02/22/2014   Anticipated DC Plan:  [place]  CM consult      [facility] Choice  HOME HEALTH   Choice offered to / List presented to:  C-1 Patient   DME arranged  Vassie Moselle      DME agency  [place] arranged  [place]   Status of service:  Completed, signed off Medicare Important Message given?   (If response is "NO", the following Medicare IM given date fields will be blank) Date Medicare IM given:   Medicare IM given by:   Date Additional Medicare IM given:   Additional Medicare IM given by:    Discharge Disposition:  [place]  Per UR Regulation:    If discussed at Long Length of Stay Meetings, dates discussed:    Comments:  02/21/14 13:00 CM met with pt in room to offer choice of home health agency.  Pt chooses [facility] to render HHPT.  CM called [facility] DME rep, Lecretia to please deliver rolling walker to room.  Address and contact information verified by pt.  Referral given to Shaune Leeks (on unit).  No other CM needs were communicated. [name], BSN, [place].

## 2014-02-22 LAB — BASIC METABOLIC PANEL
Anion gap: 12 (ref 5–15)
BUN: 26 mg/dL — ABNORMAL HIGH (ref 6–23)
CALCIUM: 8.7 mg/dL (ref 8.4–10.5)
CO2: 26 mmol/L (ref 19–32)
Chloride: 99 mEq/L (ref 96–112)
Creatinine, Ser: 0.81 mg/dL (ref 0.50–1.10)
GFR calc Af Amer: 80 mL/min — ABNORMAL LOW (ref >90)
GFR calc non Af Amer: 69 mL/min — ABNORMAL LOW (ref >90)
Glucose, Bld: 149 mg/dL — ABNORMAL HIGH (ref 70–99)
POTASSIUM: 4.8 mmol/L (ref 3.5–5.1)
SODIUM: 137 mmol/L (ref 135–145)

## 2014-02-22 LAB — GLUCOSE, CAPILLARY
GLUCOSE-CAPILLARY: 125 mg/dL — AB (ref 70–99)
Glucose-Capillary: 169 mg/dL — ABNORMAL HIGH (ref 70–99)

## 2014-02-22 LAB — CBC
HEMATOCRIT: 29.4 % — AB (ref 36.0–46.0)
Hemoglobin: 8.8 g/dL — ABNORMAL LOW (ref 12.0–15.0)
MCH: 24 pg — ABNORMAL LOW (ref 26.0–34.0)
MCHC: 29.9 g/dL — ABNORMAL LOW (ref 30.0–36.0)
MCV: 80.1 fL (ref 78.0–100.0)
PLATELETS: 216 10*3/uL (ref 150–400)
RBC: 3.67 MIL/uL — ABNORMAL LOW (ref 3.87–5.11)
RDW: 18 % — ABNORMAL HIGH (ref 11.5–15.5)
WBC: 13 10*3/uL — ABNORMAL HIGH (ref 4.0–10.5)

## 2014-02-22 MED ORDER — FERROUS SULFATE 325 (65 FE) MG PO TABS: 325.0000 mg | ORAL_TABLET | Freq: Three times a day (TID) | ORAL | Status: DC

## 2014-02-22 MED ORDER — METHOCARBAMOL 500 MG PO TABS: 500.0000 mg | ORAL_TABLET | Freq: Four times a day (QID) | ORAL | Status: DC | PRN

## 2014-02-22 MED ORDER — HYDROMORPHONE HCL 2 MG PO TABS: 2.0000 mg | ORAL_TABLET | ORAL | Status: DC | PRN

## 2014-02-22 NOTE — Progress Notes (Signed)
Physical Therapy Treatment Patient Details Name: [name] MRN: [id number] DOB: October 26, 1940 Today's Date: 02/22/2014    History of Present Illness R TKR    PT Comments    POD # 2 am session.  Pt c/o voiding a lot.  Assisted out of recliner to [facility] then amb a short distance in hallway.  Returned to room then performed TKR TE's followed by ICE.   Follow Up Recommendations  Home health PT     Equipment Recommendations  Rolling walker with 5" wheels    Recommendations for Other Services       Precautions / Restrictions Precautions Precautions: Knee;Fall Precaution Comments: Instructed pt on KI use for amb Required Braces or Orthoses: Knee Immobilizer - Right Knee Immobilizer - Right: Discontinue once straight leg raise with < 10 degree lag Restrictions Weight Bearing Restrictions: No Other Position/Activity Restrictions: WBAT    Mobility  Bed Mobility               General bed mobility comments: Pt OOB in recliner  Transfers Overall transfer level: Needs assistance Equipment used: Rolling walker (2 wheeled) Transfers: Sit to/from Stand Sit to Stand: Min assist         General transfer comment: 50% VC's on proper hand placement and increased time to complete turns. assisted off recliner and on/off BSC.  Ambulation/Gait Ambulation/Gait assistance: Min assist Ambulation Distance (Feet): 15 Feet Assistive device: Rolling walker (2 wheeled) Gait Pattern/deviations: Step-to pattern;Decreased stance time - right;Trunk flexed Gait velocity: decreased   General Gait Details: 25% VC's on proper sequencing, upright posture and R LE placement within walker.    Stairs            Wheelchair Mobility    Modified Rankin (Stroke Patients Only)       Balance                                    Cognition                            Exercises   Total Knee Replacement TE's 10 reps B LE ankle pumps 10 reps towel squeezes 10 reps  knee presses 10 reps heel slides  10 reps SAQ's 10 reps SLR's 10 reps ABD Followed by ICE     General Comments        Pertinent Vitals/Pain Pain Assessment: 0-10 Pain Score: 7  Pain Location: R knee Pain Descriptors / Indicators: Aching;Sore Pain Intervention(s): Monitored during session;Premedicated before session;Repositioned;Ice applied    Home Living                      Prior Function            PT Goals (current goals can now be found in the care plan section) Progress towards PT goals: Progressing toward goals    Frequency  7X/week    PT Plan      Co-evaluation             End of Session Equipment Utilized During Treatment: Gait belt;Right knee immobilizer Activity Tolerance: Patient tolerated treatment well;Patient limited by fatigue Patient left: in chair;with call bell/phone within reach     Time: 1125-1150 PT Time Calculation (min) (ACUTE ONLY): 25 min  Charges:  $Gait Training: 8-22 mins $Therapeutic Exercise: 8-22 mins  G Codes:      Rica Koyanagi  PTA [facility]  Acute  Rehab Pager      [phone]

## 2014-02-22 NOTE — Evaluation (Signed)
Occupational Therapy Evaluation Patient Details Name: [name] MRN: [id number] DOB: [birth year] Today's Date: 02/22/2014    History of Present Illness R TKR   Clinical Impression   This 74 year old female was admitted for the above surgery.  Will follow in acute with supervision level goals for toileting and standing tasks.  Pt will have assistance for LB adls at home.  She currently needs mod A for LB adls and min A for transfers. She was independent prior to sx.    Follow Up Recommendations  Supervision/Assistance - 24 hour    Equipment Recommendations  3 in 1 bedside comode    Recommendations for Other Services       Precautions / Restrictions Precautions Precautions: Knee;Fall Required Braces or Orthoses: Knee Immobilizer - Right Knee Immobilizer - Right: Discontinue once straight leg raise with < 10 degree lag Restrictions Other Position/Activity Restrictions: WBAT      Mobility Bed Mobility   Bed Mobility: Sit to Supine       Sit to supine: Min assist   General bed mobility comments: crossed LLE under R, light min A given to support leg  Transfers   Equipment used: Rolling walker (2 wheeled) Transfers: Sit to/from Stand Sit to Stand: Min assist         General transfer comment: cues for LE and UE placement    Balance                                            ADL Overall ADL's : Needs assistance/impaired             Lower Body Bathing: Moderate assistance;Sit to/from stand       Lower Body Dressing: Moderate assistance;Sit to/from stand   Toilet Transfer: Minimal assistance;Stand-pivot (recliner to bed)             General ADL Comments: pt is able to complete UB adls with set up.  She does not have AE but will have 24/7 assistance for LB adls.  Pt will need a 3:1.  She has urgency.  Showed tub transfer bench, but pt cannot afford to buy this.  She will sponge bathe.  Pt stated she did not feel comfortable with  bed mobility:  practiced this and she hooked LLE under R to assist leg up onto bed.  Reviewed not putting a pillow under her knee.  Educated she can put a pillow under calf, and she was more comfortable with this.  Also educated on donning KI and to use when standing and walking     Vision                     Perception     Praxis      Pertinent Vitals/Pain Pain Score: 7  Pain Location: R knee Pain Descriptors / Indicators: Aching Pain Intervention(s): Limited activity within patient's tolerance;Monitored during session;Premedicated before session;Repositioned;Ice applied     Hand Dominance     Extremity/Trunk Assessment Upper Extremity Assessment Upper Extremity Assessment: Overall WFL for tasks assessed           Communication Communication Communication: No difficulties   Cognition Arousal/Alertness: Awake/alert Behavior During Therapy: WFL for tasks assessed/performed Overall Cognitive Status: Within Functional Limits for tasks assessed  General Comments       Exercises       Shoulder Instructions      Home Living Family/patient expects to be discharged to:: Private residence Living Arrangements: Alone Available Help at Discharge: Family               Bathroom Shower/Tub: Tub/shower unit;Curtain   Bathroom Toilet: Standard     Home Equipment: Cane - quad          Prior Functioning/Environment Level of Independence: Independent;Independent with assistive device(s)             OT Diagnosis: Generalized weakness   OT Problem List: Decreased strength;Decreased activity tolerance;Pain   OT Treatment/Interventions: Self-care/ADL training;DME and/or AE instruction;Patient/family education    OT Goals(Current goals can be found in the care plan section) Acute Rehab OT Goals Patient Stated Goal: get back to being independent; decreased pain OT Goal Formulation: With patient Time For Goal Achievement:  03/01/14 Potential to Achieve Goals: Good ADL Goals Pt Will Perform Grooming: with supervision;standing Pt Will Transfer to Toilet: with supervision;ambulating;bedside commode Pt Will Perform Toileting - Clothing Manipulation and hygiene: with supervision;sit to/from stand  OT Frequency: Min 2X/week   Barriers to D/C:            Co-evaluation              End of Session    Activity Tolerance: Patient tolerated treatment well Patient left: in bed;with call bell/phone within reach   Time: 4270-6237 OT Time Calculation (min): 36 min Charges:  OT General Charges $OT Visit: 1 Procedure OT Evaluation $Initial OT Evaluation Tier I: 1 Procedure OT Treatments $Self Care/Home Management : 8-22 mins $Therapeutic Activity: 8-22 mins G-Codes:    Ronne Savoia 12-Mar-2014, 11:26 AM   Lesle Chris, OTR/L [phone] 03-12-2014

## 2014-02-22 NOTE — Progress Notes (Signed)
Subjective: 2 Days Post-Op Procedure(s) (LRB): RIGHT TOTAL KNEE ARTHROPLASTY (Right) Patient reports pain as 2 on 0-10 scale. Doing Well. Will DC   Objective: Vital signs in last 24 hours: Temp:  [99.1 F (37.3 C)-100.2 F (37.9 C)] 99.1 F (37.3 C) (01/15 0529) Pulse Rate:  [92-112] 96 (01/15 0529) Resp:  [16-18] 17 (01/15 0529) BP: (129-138)/(51-60) 135/55 mmHg (01/15 0529) SpO2:  [76 %-97 %] 97 % (01/15 0529)  Intake/Output from previous day: 01/14 0701 - 01/15 0700 In: 240 [P.O.:240] Out: 900 [Urine:900] Intake/Output this shift:     Recent Labs  02/21/14 0355 02/22/14 0416  HGB 9.2* 8.8*    Recent Labs  02/21/14 0355 02/22/14 0416  WBC 9.6 13.0*  RBC 3.76* 3.67*  HCT 30.4* 29.4*  PLT 229 216    Recent Labs  02/21/14 0355  NA 135  K 4.0  CL 98  CO2 28  BUN 16  CREATININE 0.54  GLUCOSE 113*  CALCIUM 8.1*   No results for input(s): LABPT, INR in the last 72 hours.  Dorsiflexion/Plantar flexion intact  Assessment/Plan: 2 Days Post-Op Procedure(s) (LRB): RIGHT TOTAL KNEE ARTHROPLASTY (Right) Discharge home with home health  Rozalynn Buege A 02/22/2014, 7:09 AM

## 2014-02-27 ENCOUNTER — Other Ambulatory Visit: Payer: Self-pay | Admitting: Cardiovascular Disease

## 2014-02-27 NOTE — Discharge Summary (Signed)
Physician Discharge Summary   Patient ID: [name] MRN: [id number] DOB/AGE: [birth year] 74 y.o.  Admit date: 02/20/2014 Discharge date: 02/22/2014  Primary Diagnosis: Osteoarthritis, right knee   Admission Diagnoses:  Past Medical History  Diagnosis Date  . Coronary artery disease   . Diabetes mellitus   . Hypertension   . Hyperlipidemia   . Shoulder injury     resolved after shoulder surgery  . Chest pain   . Acute myocardial infarction, unspecified site, episode of care unspecified   . Unspecified asthma(493.90)   . PONV (postoperative nausea and vomiting)   . Anxiety   . GERD (gastroesophageal reflux disease)   . Upper respiratory symptom     02-13-14 "runny nose, mild cough,sniffles" -instructed to inform MD if symptoms worsens   Discharge Diagnoses:   Active Problems:   H/O total knee replacement  Estimated body mass index is 37.12 kg/(m^2) as calculated from the following:   Height as of this encounter: _0  (1.575 m).   Weight as of this encounter: 92.08 kg (203 lb).  Procedure:  Procedure(s) (LRB): RIGHT TOTAL KNEE ARTHROPLASTY (Right)   Consults: None  HPI: [name], 74 y.o. female, has a history of pain and functional disability in the right knee due to arthritis and has failed non-surgical conservative treatments for greater than 12 weeks to includeNSAID's and/or analgesics, corticosteriod injections and viscosupplementation injections. Onset of symptoms was gradual, starting 5 years ago with gradually worsening course since that time. The patient noted prior procedures on the knee to include arthroscopy and menisectomy on the right knee(s). Patient currently rates pain in the right knee(s) at 8 out of 10 with activity. Patient has night pain, worsening of pain with activity and weight bearing, pain that interferes with activities of daily living, pain with passive range of motion, crepitus and joint swelling. Patient has evidence of subchondral cysts,  periarticular osteophytes and joint space narrowing by imaging studies. There is no active infection.  Laboratory Data: Admission on 02/20/2014, Discharged on 02/22/2014  Component Date Value Ref Range Status  . Glucose-Capillary 02/20/2014 121* 70 - 99 mg/dL Final  . Glucose-Capillary 02/20/2014 117* 70 - 99 mg/dL Final  . WBC 02/21/2014 9.6  4.0 - 10.5 K/uL Final  . RBC 02/21/2014 3.76* 3.87 - 5.11 MIL/uL Final  . Hemoglobin 02/21/2014 9.2* 12.0 - 15.0 g/dL Final  . HCT 02/21/2014 30.4* 36.0 - 46.0 % Final  . MCV 02/21/2014 80.9  78.0 - 100.0 fL Final  . MCH 02/21/2014 24.5* 26.0 - 34.0 pg Final  . MCHC 02/21/2014 30.3  30.0 - 36.0 g/dL Final  . RDW 02/21/2014 18.1* 11.5 - 15.5 % Final  . Platelets 02/21/2014 229  150 - 400 K/uL Final  . Sodium 02/21/2014 135  135 - 145 mmol/L Final   Please note change in reference range.  . Potassium 02/21/2014 4.0  3.5 - 5.1 mmol/L Final   Please note change in reference range.  . Chloride 02/21/2014 98  96 - 112 mEq/L Final  . CO2 02/21/2014 28  19 - 32 mmol/L Final  . Glucose, Bld 02/21/2014 113* 70 - 99 mg/dL Final  . BUN 02/21/2014 16  6 - 23 mg/dL Final  . Creatinine, Ser 02/21/2014 0.54  0.50 - 1.10 mg/dL Final  . Calcium 02/21/2014 8.1* 8.4 - 10.5 mg/dL Final  . GFR calc non Af Amer 02/21/2014 >90  >90 mL/min Final  . GFR calc Af Amer 02/21/2014 >90  >90 mL/min Final   Comment: (  NOTE) The eGFR has been calculated using the CKD EPI equation. This calculation has not been validated in all clinical situations. eGFR's persistently <90 mL/min signify possible Chronic Kidney Disease.   . Anion gap 02/21/2014 9  5 - 15 Final  . Glucose-Capillary 02/20/2014 127* 70 - 99 mg/dL Final  . Comment 1 02/20/2014 Notify RN   Final  . Glucose-Capillary 02/20/2014 108* 70 - 99 mg/dL Final  . Comment 1 02/20/2014 Notify RN   Final  . Comment 2 02/20/2014 Documented in Chart   Final  . Glucose-Capillary 02/21/2014 114* 70 - 99 mg/dL Final  .  Comment 1 02/21/2014 Notify RN   Final  . Comment 2 02/21/2014 Documented in Chart   Final  . Glucose-Capillary 02/21/2014 122* 70 - 99 mg/dL Final  . Comment 1 02/21/2014 Notify RN   Final  . Comment 2 02/21/2014 Documented in Chart   Final  . WBC 02/22/2014 13.0* 4.0 - 10.5 K/uL Final  . RBC 02/22/2014 3.67* 3.87 - 5.11 MIL/uL Final  . Hemoglobin 02/22/2014 8.8* 12.0 - 15.0 g/dL Final  . HCT 02/22/2014 29.4* 36.0 - 46.0 % Final  . MCV 02/22/2014 80.1  78.0 - 100.0 fL Final  . MCH 02/22/2014 24.0* 26.0 - 34.0 pg Final  . MCHC 02/22/2014 29.9* 30.0 - 36.0 g/dL Final  . RDW 02/22/2014 18.0* 11.5 - 15.5 % Final  . Platelets 02/22/2014 216  150 - 400 K/uL Final  . Sodium 02/22/2014 137  135 - 145 mmol/L Final   Please note change in reference range.  . Potassium 02/22/2014 4.8  3.5 - 5.1 mmol/L Final   Please note change in reference range.  . Chloride 02/22/2014 99  96 - 112 mEq/L Final  . CO2 02/22/2014 26  19 - 32 mmol/L Final  . Glucose, Bld 02/22/2014 149* 70 - 99 mg/dL Final  . BUN 02/22/2014 26* 6 - 23 mg/dL Final  . Creatinine, Ser 02/22/2014 0.81  0.50 - 1.10 mg/dL Final  . Calcium 02/22/2014 8.7  8.4 - 10.5 mg/dL Final  . GFR calc non Af Amer 02/22/2014 69* >90 mL/min Final  . GFR calc Af Amer 02/22/2014 80* >90 mL/min Final   Comment: (NOTE) The eGFR has been calculated using the CKD EPI equation. This calculation has not been validated in all clinical situations. eGFR's persistently <90 mL/min signify possible Chronic Kidney Disease.   . Anion gap 02/22/2014 12  5 - 15 Final  . Glucose-Capillary 02/21/2014 140* 70 - 99 mg/dL Final  . Comment 1 02/21/2014 Notify RN   Final  . Glucose-Capillary 02/21/2014 123* 70 - 99 mg/dL Final  . Glucose-Capillary 02/22/2014 125* 70 - 99 mg/dL Final  . Glucose-Capillary 02/22/2014 169* 70 - 99 mg/dL Final  Hospital Outpatient Visit on 02/13/2014  Component Date Value Ref Range Status  . MRSA, PCR 02/13/2014 NEGATIVE  NEGATIVE Final    . Staphylococcus aureus 02/13/2014 NEGATIVE  NEGATIVE Final   Comment:        The Xpert SA Assay (FDA approved for NASAL specimens in patients over 10 years of age), is one component of a comprehensive surveillance program.  Test performance has been validated by [employer] for patients greater than or equal to 83 year old. It is not intended to diagnose infection nor to guide or monitor treatment.   . Sodium 02/13/2014 145  135 - 145 mmol/L Final   Please note change in reference range.  . Potassium 02/13/2014 4.3  3.5 - 5.1 mmol/L Final  Please note change in reference range.  . Chloride 02/13/2014 106  96 - 112 mEq/L Final  . CO2 02/13/2014 31  19 - 32 mmol/L Final  . Glucose, Bld 02/13/2014 139* 70 - 99 mg/dL Final  . BUN 02/13/2014 15  6 - 23 mg/dL Final  . Creatinine, Ser 02/13/2014 0.66  0.50 - 1.10 mg/dL Final  . Calcium 02/13/2014 8.9  8.4 - 10.5 mg/dL Final  . Total Protein 02/13/2014 6.9  6.0 - 8.3 g/dL Final  . Albumin 02/13/2014 3.7  3.5 - 5.2 g/dL Final  . AST 02/13/2014 17  0 - 37 U/L Final  . ALT 02/13/2014 14  0 - 35 U/L Final  . Alkaline Phosphatase 02/13/2014 104  39 - 117 U/L Final  . Total Bilirubin 02/13/2014 0.5  0.3 - 1.2 mg/dL Final  . GFR calc non Af Amer 02/13/2014 84* >90 mL/min Final  . GFR calc Af Amer 02/13/2014 >90  >90 mL/min Final   Comment: (NOTE) The eGFR has been calculated using the CKD EPI equation. This calculation has not been validated in all clinical situations. eGFR's persistently <90 mL/min signify possible Chronic Kidney Disease.   . Anion gap 02/13/2014 8  5 - 15 Final  . Prothrombin Time 02/13/2014 13.7  11.6 - 15.2 seconds Final  . INR 02/13/2014 1.04  0.00 - 1.49 Final  . ABO/RH(D) 02/13/2014 A POS   Final  . Antibody Screen 02/13/2014 NEG   Final  . Sample Expiration 02/13/2014 02/23/2014   Final  . Color, Urine 02/13/2014 [place]* YELLOW Final   BIOCHEMICALS MAY BE AFFECTED BY COLOR  . APPearance 02/13/2014 CLOUDY*  CLEAR Final  . Specific Gravity, Urine 02/13/2014 1.027  1.005 - 1.030 Final  . pH 02/13/2014 5.0  5.0 - 8.0 Final  . Glucose, UA 02/13/2014 NEGATIVE  NEGATIVE mg/dL Final  . Hgb urine dipstick 02/13/2014 NEGATIVE  NEGATIVE Final  . Bilirubin Urine 02/13/2014 SMALL* NEGATIVE Final  . Ketones, ur 02/13/2014 NEGATIVE  NEGATIVE mg/dL Final  . Protein, ur 02/13/2014 NEGATIVE  NEGATIVE mg/dL Final  . Urobilinogen, UA 02/13/2014 1.0  0.0 - 1.0 mg/dL Final  . Nitrite 02/13/2014 NEGATIVE  NEGATIVE Final  . Leukocytes, UA 02/13/2014 SMALL* NEGATIVE Final  . WBC 02/13/2014 8.1  4.0 - 10.5 K/uL Final  . RBC 02/13/2014 4.51  3.87 - 5.11 MIL/uL Final  . Hemoglobin 02/13/2014 10.9* 12.0 - 15.0 g/dL Final  . HCT 02/13/2014 36.6  36.0 - 46.0 % Final  . MCV 02/13/2014 81.2  78.0 - 100.0 fL Final  . MCH 02/13/2014 24.2* 26.0 - 34.0 pg Final  . MCHC 02/13/2014 29.8* 30.0 - 36.0 g/dL Final  . RDW 02/13/2014 18.2* 11.5 - 15.5 % Final  . Platelets 02/13/2014 269  150 - 400 K/uL Final  . Squamous Epithelial / LPF 02/13/2014 RARE  RARE Final  . WBC, UA 02/13/2014 7-10  <3 WBC/hpf Final  . RBC / HPF 02/13/2014 0-2  <3 RBC/hpf Final  . Bacteria, UA 02/13/2014 MANY* RARE Final  . Urine-Other 02/13/2014 MUCOUS PRESENT   Final  . ABO/RH(D) 02/13/2014 A POS   Final     X-Rays:Dg Chest 2 View  02/13/2014   CLINICAL DATA:  Hypertension.  Diabetes.  Preoperative chest x-ray.  EXAM: CHEST  2 VIEW  COMPARISON:  07/25/2012.  FINDINGS: Mediastinum and hilar structures normal. Lungs are clear. Mild cardiomegaly. Normal pulmonary vascularity. Prior CABG. No pleural effusion or pneumothorax. Right shoulder replacement.  IMPRESSION: No acute cardiopulmonary disease.  Prior  CABG.   Electronically Signed   By: Marcello Moores  Register   On: 02/13/2014 15:45    EKG: Orders placed or performed in visit on 02/13/14  . EKG 12-Lead     Hospital Course: [name] is a 74 y.o. who was admitted to [facility]. They were  brought to the operating room on 02/20/2014 and underwent Procedure(s): RIGHT TOTAL KNEE ARTHROPLASTY.  Patient tolerated the procedure well and was later transferred to the recovery room and then to the orthopaedic floor for postoperative care.  They were given PO and IV analgesics for pain control following their surgery.  They were given 24 hours of postoperative antibiotics of  Anti-infectives    Start     Dose/Rate Route Frequency Ordered Stop   02/21/14 0000  ceFAZolin (ANCEF) IVPB 1 g/50 mL premix     1 g100 mL/hr over 30 Minutes Intravenous 4 times per day 02/20/14 2228 02/21/14 0627   02/20/14 0923  polymyxin B 500,000 Units, bacitracin 50,000 Units in sodium chloride irrigation 0.9 % 500 mL irrigation  Status:  Discontinued       As needed 02/20/14 0923 02/20/14 1102   02/20/14 0631  ceFAZolin (ANCEF) IVPB 2 g/50 mL premix     2 g100 mL/hr over 30 Minutes Intravenous On call to O.R. 02/20/14 0631 02/20/14 0830     and started on DVT prophylaxis in the form of Xarelto.   PT and OT were ordered for total joint protocol.  Discharge planning consulted to help with postop disposition and equipment needs.  Patient had a fair night on the evening of surgery.  Worked with therapy POD 1.  Hemovac drain was pulled without difficulty.  Continued to work with therapy into day two. The patient had progressed with therapy and meeting their goals.  Incision was healing well.  Patient was seen in rounds and was ready to go home.   Diet: Cardiac diet Activity:WBAT Follow-up:in 2 weeks Disposition - Home Discharged Condition: stable   Discharge Instructions    Call MD / Call 911    Complete by:  As directed   If you experience chest pain or shortness of breath, CALL 911 and be transported to the hospital emergency room.  If you develope a fever above 101 F, pus (white drainage) or increased drainage or redness at the wound, or calf pain, call your surgeon's office.     Constipation Prevention     Complete by:  As directed   Drink plenty of fluids.  Prune juice may be helpful.  You may use a stool softener, such as Colace (over the counter) 100 mg twice a day.  Use MiraLax (over the counter) for constipation as needed.     Diet - low sodium heart healthy    Complete by:  As directed      Discharge instructions    Complete by:  As directed   Walk with your walker. Weight bearing as tolerated [place] will follow you at home for your therapy  Do not change your dressing unless it appears compromised Shower only, no tub bath. Call if any temperatures greater than 101 or any wound complications: [phone] during the day and ask for Dr. Charlestine Night nurse, Brunilda Payor.     Do not put a pillow under the knee. Place it under the heel.    Complete by:  As directed      Driving restrictions    Complete by:  As directed   No  driving     Increase activity slowly as tolerated    Complete by:  As directed             Medication List    TAKE these medications        albuterol 108 (90 BASE) MCG/ACT inhaler  Commonly known as:  PROVENTIL HFA;VENTOLIN HFA  Inhale 2 puffs into the lungs every 6 (six) hours as needed. For shortness of breath.     albuterol (2.5 MG/3ML) 0.083% nebulizer solution  Commonly known as:  PROVENTIL  Take 2.5 mg by nebulization every 6 (six) hours as needed. For shortness of breath.     ALPRAZolam 0.25 MG tablet  Commonly known as:  XANAX  Take 0.25 mg by mouth 2 (two) times daily as needed.     baclofen 20 MG tablet  Commonly known as:  LIORESAL  Take 1 tablet by mouth 2 (two) times daily.     beclomethasone 80 MCG/ACT inhaler  Commonly known as:  QVAR  Inhale 3 puffs into the lungs 2 (two) times daily.     clopidogrel 75 MG tablet  Commonly known as:  PLAVIX  Take 75 mg by mouth every morning.     diltiazem 240 MG 24 hr capsule  Commonly known as:  CARDIZEM CD  Take 1 capsule (240 mg total) by mouth daily.     doxazosin 8 MG tablet    Commonly known as:  CARDURA  Take 8 mg by mouth daily.     famotidine 20 MG tablet  Commonly known as:  PEPCID  Take 20 mg by mouth 2 (two) times daily.     ferrous sulfate 325 (65 FE) MG tablet  Take 1 tablet (325 mg total) by mouth 3 (three) times daily after meals.     Fluticasone Furoate-Vilanterol 200-25 MCG/INH Aepb  Commonly known as:  BREO ELLIPTA  Inhale 1 puff into the lungs daily.     furosemide 40 MG tablet  Commonly known as:  LASIX  Take 40 mg by mouth daily.     HYDROmorphone 2 MG tablet  Commonly known as:  DILAUDID  Take 1 tablet (2 mg total) by mouth every 4 (four) hours as needed for severe pain.     insulin lispro protamine-lispro (75-25) 100 UNIT/ML Susp injection  Commonly known as:  HUMALOG 75/25 MIX  Inject 20-27 Units into the skin 2 (two) times daily with a meal. 20 in am, 27 at dinner     INVOKANA 300 MG Tabs tablet  Generic drug:  canagliflozin  Take 300 mg by mouth daily before breakfast.     meclizine 25 MG tablet  Commonly known as:  ANTIVERT  Take 25 mg by mouth as needed for dizziness (dizziness).     methocarbamol 500 MG tablet  Commonly known as:  ROBAXIN  Take 1 tablet (500 mg total) by mouth every 6 (six) hours as needed for muscle spasms.     metoprolol succinate 50 MG 24 hr tablet  Commonly known as:  TOPROL-XL  Take 1 tablet by mouth daily.     multivitamin with minerals Tabs tablet  Take 1 tablet by mouth daily.     MYLANTA PO  Take 1 tablet by mouth daily as needed. For indigestion.     nitroGLYCERIN 0.4 MG SL tablet  Commonly known as:  NITROSTAT  Place 0.4 mg under the tongue every 5 (five) minutes x 3 doses as needed. For chest pain.     omeprazole 20 MG  capsule  Commonly known as:  PRILOSEC  Take 20 mg by mouth daily.     potassium chloride SA 20 MEQ tablet  Commonly known as:  K-DUR,KLOR-CON  Take 20 mEq by mouth 2 (two) times daily.     Vitamin D 2000 UNITS tablet  Take 2,000 Units by mouth daily.            Follow-up Information    Follow up with [facility].   Why:  rolling walker   Contact information:   [address] [place] [address] [phone]       Follow up with [facility].   Why:  home health physical therapy   Contact information:   [address] [place] [address] [phone]       Follow up with GIOFFRE,RONALD A, MD. Schedule an appointment as soon as possible for a visit in 2 weeks.   Specialty:  Orthopedic Surgery   Contact information:   [address] [place] [address] [phone]       Signed: Ardeen Jourdain, PA-C Orthopaedic Surgery 02/27/2014, 10:30 AM

## 2014-04-01 ENCOUNTER — Other Ambulatory Visit: Payer: Self-pay | Admitting: Cardiovascular Disease

## 2014-04-24 ENCOUNTER — Encounter: Payer: Self-pay | Admitting: Critical Care Medicine

## 2014-04-24 ENCOUNTER — Ambulatory Visit (INDEPENDENT_AMBULATORY_CARE_PROVIDER_SITE_OTHER): Payer: Medicare Other | Admitting: Critical Care Medicine

## 2014-04-24 VITALS — BP 132/76 | HR 92 | Ht 63.0 in | Wt 191.0 lb

## 2014-04-24 DIAGNOSIS — J45909 Unspecified asthma, uncomplicated: Secondary | ICD-10-CM

## 2014-04-24 DIAGNOSIS — J4541 Moderate persistent asthma with (acute) exacerbation: Secondary | ICD-10-CM

## 2014-04-24 MED ORDER — ALBUTEROL SULFATE (2.5 MG/3ML) 0.083% IN NEBU: 2.5000 mg | INHALATION_SOLUTION | Freq: Four times a day (QID) | RESPIRATORY_TRACT | Status: DC | PRN

## 2014-04-24 MED ORDER — METHYLPREDNISOLONE ACETATE 80 MG/ML IJ SUSP
120.0000 mg | Freq: Once | INTRAMUSCULAR | Status: AC
  Administered 2014-04-24: 120 mg via INTRAMUSCULAR

## 2014-04-24 MED ORDER — FLUTICASONE FUROATE-VILANTEROL 200-25 MCG/INH IN AEPB: 1.0000 | INHALATION_SPRAY | Freq: Every day | RESPIRATORY_TRACT | Status: DC

## 2014-04-24 MED ORDER — COMPRESSOR/NEBULIZER MISC: Status: DC

## 2014-04-24 MED ORDER — BECLOMETHASONE DIPROPIONATE 80 MCG/ACT IN AERS: 3.0000 | INHALATION_SPRAY | Freq: Two times a day (BID) | RESPIRATORY_TRACT | Status: DC

## 2014-04-24 NOTE — Progress Notes (Signed)
Subjective:    Patient ID: [name], female    DOB: 03-08-1940, 74 y.o.   MRN: [id number]  HPI  04/24/2014 Chief Complaint  Patient presents with  . Follow-up    asthma; breathing no better; SOBw/activity; lots throat clearing; some cough     Much worse over time with more dyspnea, and cough, using saba several times per day Notes night time dyspnea.  PUL ASTHMA HISTORY 04/24/2014 01/23/2014 11/12/2013 09/14/2013 06/05/2013 03/06/2012 12/22/2011  Symptoms Daily >2 days/week Daily Throughout the day Daily Daily Daily  Nighttime awakenings Often--7/wk >1/wk but not nightly 0-2/month 0-2/month >1/wk but not nightly Often--7/wk >1/wk but not nightly  Interference with activity Some limitations Some limitations Minor limitations Some limitations Some limitations Some limitations Some limitations  SABA use Several times/day Several times/day Several times/day Daily Several times/day Several times/day > 2 days/wk--not > 1 x/day  Exacerbations requiring oral steroids 2 or more / year 0-1 / year 2 or more / year 0-1 / year 0-1 / year 2 or more / year 0-1 / year     Review of Systems Constitutional:   No  weight loss, night sweats,  Fevers, chills, fatigue, lassitude. HEENT:   No headaches,  Difficulty swallowing,  Tooth/dental problems,  Sore throat,                No sneezing, itching, ear ache, nasal congestion, post nasal drip,   CV:  No chest pain,  Orthopnea, PND, swelling in lower extremities, anasarca, dizziness, palpitations  GI  No heartburn, indigestion, abdominal pain, nausea, vomiting, diarrhea, change in bowel habits, loss of appetite  Resp: Notes  shortness of breath with exertion not  at rest.  No excess mucus, no productive cough,  No non-productive cough,  No coughing up of blood.  No change in color of mucus.  No wheezing.  No chest wall deformity  Skin: no rash or lesions.  GU: no dysuria, change in color of urine, no urgency or frequency.  No flank pain.  MS:  No  joint pain or swelling.  No decreased range of motion.  No back pain.  Psych:  No change in mood or affect. No depression or anxiety.  No memory loss.     Objective:   Physical Exam Filed Vitals:   04/24/14 1045  BP: 132/76  Pulse: 92  Height: 5\' 3"  (1.6 m)  Weight: 191 lb (86.637 kg)  SpO2: 100%    Gen: Pleasant, well-nourished, in no distress,  normal affect  ENT: No lesions,  mouth clear,  oropharynx clear, no postnasal drip  Neck: No JVD, no TMG, no carotid bruits  Lungs: No use of accessory muscles, no dullness to percussion, exp wheeze   Cardiovascular: RRR, heart sounds normal, no murmur or gallops, no peripheral edema  Abdomen: soft and NT, no HSM,  BS normal  Musculoskeletal: No deformities, no cyanosis or clubbing  Neuro: alert, non focal  Skin: Warm, no lesions or rashes  No results found.        Assessment & Plan:   Extrinsic asthma Mod persistent asthma with flare  Plan  Cont Breo 200 one puff daily A new nebulizer will be obtained A depomedrol injection 120mg  IM Return 4 months      Updated Medication List Outpatient Encounter Prescriptions as of 04/24/2014  Medication Sig  . albuterol (PROVENTIL HFA;VENTOLIN HFA) 108 (90 BASE) MCG/ACT inhaler Inhale 2 puffs into the lungs every 6 (six) hours as needed. For shortness of breath.  Marland Kitchen  albuterol (PROVENTIL) (2.5 MG/3ML) 0.083% nebulizer solution Take 3 mLs (2.5 mg total) by nebulization every 6 (six) hours as needed. For shortness of breath.  . ALPRAZolam (XANAX) 0.25 MG tablet Take 0.25 mg by mouth 2 (two) times daily as needed.   . baclofen (LIORESAL) 20 MG tablet Take 1 tablet by mouth 2 (two) times daily.  . beclomethasone (QVAR) 80 MCG/ACT inhaler Inhale 3 puffs into the lungs 2 (two) times daily.  . Calcium & Magnesium Carbonates (MYLANTA PO) Take 1 tablet by mouth daily as needed. For indigestion.  . canagliflozin (INVOKANA) 300 MG TABS tablet Take 300 mg by mouth daily before breakfast.   . Cholecalciferol (VITAMIN D) 2000 UNITS tablet Take 2,000 Units by mouth daily.   . clopidogrel (PLAVIX) 75 MG tablet Take 75 mg by mouth every morning.  . diltiazem (CARDIZEM CD) 240 MG 24 hr capsule TAKE ONE CAPSULE BY MOUTH EVERY DAY  . doxazosin (CARDURA) 8 MG tablet Take 1 tablet (8 mg total) by mouth daily.  . famotidine (PEPCID) 20 MG tablet Take 20 mg by mouth 2 (two) times daily.  . ferrous sulfate 325 (65 FE) MG tablet Take 1 tablet (325 mg total) by mouth 3 (three) times daily after meals.  . Fluticasone Furoate-Vilanterol (BREO ELLIPTA) 200-25 MCG/INH AEPB Inhale 1 puff into the lungs daily.  . furosemide (LASIX) 40 MG tablet Take 40 mg by mouth daily.   Marland Kitchen HYDROmorphone (DILAUDID) 2 MG tablet Take 1 tablet (2 mg total) by mouth every 4 (four) hours as needed for severe pain.  Marland Kitchen insulin lispro protamine-insulin lispro (HUMALOG 75/25) (75-25) 100 UNIT/ML SUSP Inject 20-27 Units into the skin 2 (two) times daily with a meal. 20 in am, 27 at dinner  . meclizine (ANTIVERT) 25 MG tablet Take 25 mg by mouth as needed for dizziness (dizziness).   . metoprolol succinate (TOPROL-XL) 50 MG 24 hr tablet Take 1 tablet by mouth daily.  . Multiple Vitamin (MULTIVITAMIN WITH MINERALS) TABS Take 1 tablet by mouth daily.  . Nebulizers (COMPRESSOR/NEBULIZER) MISC Use with albuterol  . nitroGLYCERIN (NITROSTAT) 0.4 MG SL tablet Place 0.4 mg under the tongue every 5 (five) minutes x 3 doses as needed. For chest pain.  Marland Kitchen omeprazole (PRILOSEC) 20 MG capsule Take 20 mg by mouth daily.  . potassium chloride SA (K-DUR,KLOR-CON) 20 MEQ tablet Take 20 mEq by mouth 2 (two) times daily.  . [DISCONTINUED] albuterol (PROVENTIL) (2.5 MG/3ML) 0.083% nebulizer solution Take 2.5 mg by nebulization every 6 (six) hours as needed. For shortness of breath.  . [DISCONTINUED] beclomethasone (QVAR) 80 MCG/ACT inhaler Inhale 3 puffs into the lungs 2 (two) times daily.  . [DISCONTINUED] doxazosin (CARDURA) 8 MG tablet Take 8  mg by mouth daily.  . [DISCONTINUED] Fluticasone Furoate-Vilanterol (BREO ELLIPTA) 200-25 MCG/INH AEPB Inhale 1 puff into the lungs daily.  . [DISCONTINUED] Fluticasone Furoate-Vilanterol (BREO ELLIPTA) 200-25 MCG/INH AEPB Inhale 1 puff into the lungs daily.  . [DISCONTINUED] methocarbamol (ROBAXIN) 500 MG tablet Take 1 tablet (500 mg total) by mouth every 6 (six) hours as needed for muscle spasms. (Patient not taking: Reported on 04/24/2014)  . [EXPIRED] methylPREDNISolone acetate (DEPO-MEDROL) injection 120 mg

## 2014-04-24 NOTE — Patient Instructions (Signed)
Breo one puff daily A new nebulizer will be obtained A depomedrol injection 120mg  IM Return 4 months

## 2014-04-25 ENCOUNTER — Telehealth: Payer: Self-pay | Admitting: Critical Care Medicine

## 2014-04-25 NOTE — Assessment & Plan Note (Signed)
Mod persistent asthma with flare  Plan  Cont Breo 200 one puff daily A new nebulizer will be obtained A depomedrol injection 120mg  IM Return 4 months

## 2014-04-25 NOTE — Telephone Encounter (Signed)
LMTC x 1 for pt - Notify pt that Albuterol will say to use qid but will still only need to be used as needed.

## 2014-04-25 NOTE — Telephone Encounter (Signed)
rx for albuterol 2.5 mg/3 ml needs to state QID instead of PRN for medicare to pay Pt will need to be called by [facility] to advise patient to only use as needed per [employer]  Form faxed and is in Dr Ileana Roup look at  For dr Joya Gaskins to sign on Monday to fax back to [facility]

## 2014-04-26 NOTE — Telephone Encounter (Signed)
Spoke with patient regarding Rx and instructions of use. Aware of how to take the medication.  Will send to Dr Joya Gaskins as Juluis Rainier that form from [facility] needs to be signed and given back to [facility].

## 2014-04-26 NOTE — Telephone Encounter (Signed)
i will sign on my return next wed

## 2014-04-26 NOTE — Telephone Encounter (Signed)
lmtcb for pt.  

## 2014-05-01 NOTE — Telephone Encounter (Signed)
Form has been received in Triage. Will place in PW's look at. Message will be routed to [place] to follow up on.

## 2014-05-01 NOTE — Telephone Encounter (Signed)
Crystal, has this form been signed? Thanks.

## 2014-05-01 NOTE — Telephone Encounter (Signed)
Called and spoke with Forde Radon at [facility] and she stated that she didn't leave the form on 04/30/14. She is going to fax the form to the triage fax now. Please retrieve fax off of that fax and give to Dr. Joya Gaskins to sign. Thanks, Catha Gosselin

## 2014-05-01 NOTE — Telephone Encounter (Signed)
[name], below msg states the form needed to be given back to you.  I don't have it with my to do.  Was it given to you?  Do you know if [name] with [facility] has received what is needed?  Thank you!

## 2014-05-02 NOTE — Telephone Encounter (Signed)
Albuterol rx form signed by Dr. Joya Gaskins.  It was faxed back to [facility].  Kim with [facility] aware.  Nothing further needed.

## 2014-05-14 ENCOUNTER — Other Ambulatory Visit: Payer: Self-pay | Admitting: Cardiovascular Disease

## 2014-06-11 ENCOUNTER — Telehealth: Payer: Self-pay | Admitting: Cardiovascular Disease

## 2014-06-11 NOTE — Telephone Encounter (Signed)
Pt c/o of Chest Pain:   1. Are you having CP right now? no 2. Are you experiencing any other symptoms (ex. SOB, nausea, vomiting, sweating)? Tightening in chest  3. How long have you been experiencing CP? A month 4. Is your CP continuous or coming and going? Coming and going 5. Have you taken Nitroglycerin? No   pt has been taking omeprazole and Cyclobenzaprine

## 2014-06-11 NOTE — Telephone Encounter (Signed)
Pt states she has CP for over 6 weeks off and on and happens weekly.  She has thought the pain to be indigestion or gas and has tried PPI and OTC gas-X for discomfort but now feels concerned since pain is at rest some times.  Pt is not having pain currently and has not tried Nitro for the discomfort. She states pain is left chest and is 6/10 pain level when it occurs. She states she has occasionally had some sob/ dizziness with the CP. Pt was placed on Dr Elmarie Shiley schedule for 06/17/14@1500 . Pt was told to try nitro for pain and to go to ED with further episodes, I explained why the ED was more appropriate for her symptoms. Pt verbalized understanding and acceptance of plan.  Dr Acie Fredrickson was updated.

## 2014-06-11 NOTE — Telephone Encounter (Signed)
I will see patient on Monday .[name] She has been encouraged to go to the ER if the CP worsens

## 2014-06-11 NOTE — Telephone Encounter (Signed)
lmtcb

## 2014-06-17 ENCOUNTER — Encounter: Payer: Self-pay | Admitting: Cardiovascular Disease

## 2014-06-17 ENCOUNTER — Ambulatory Visit (INDEPENDENT_AMBULATORY_CARE_PROVIDER_SITE_OTHER): Payer: Medicare Other | Admitting: Cardiovascular Disease

## 2014-06-17 VITALS — BP 116/70 | HR 83 | Ht 63.0 in | Wt 191.8 lb

## 2014-06-17 DIAGNOSIS — I25119 Atherosclerotic heart disease of native coronary artery with unspecified angina pectoris: Secondary | ICD-10-CM

## 2014-06-17 MED ORDER — ISOSORBIDE MONONITRATE ER 30 MG PO TB24: 30.0000 mg | ORAL_TABLET | Freq: Every day | ORAL | Status: DC

## 2014-06-17 NOTE — Progress Notes (Signed)
Cardiology Office Note   Date:  06/17/2014   ID:  [name], DOB [birth year], MRN [id number]  PCP:  Salena Saner., MD  Cardiologist:   Thayer Headings, MD   Chief Complaint  Patient presents with  . Chest Pain   1. Coronary artery disease-status post CABG, the saphenous vein graft to the right coronary artery is severely diseased but she has good flow to the distal right coronary artery from the native circulation. 2. Diabetes mellitus 3. Hypertension 4. Hyperlipidemia 5. COPD: June, 2013 - FEV1 equals 0.89 L which is 48% of predicted value. Her DLCO is also markedly depressed. 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   History of Present Illness:  [name] is a 74 y.o. female with the above noted hx. She continues to have episodes of sharp pain ( few seconds) followed by a burning pain ( last 6-7 minutes). She usually does not take NTG because it causes a headache. She was started on Lasix earlier this year. She seems to be doing much better on Lasix. She's had only function test which revealed severe obstructive lung disease with a good initial response to bronchodilators.  She is scheduled to have surgery all right knee. She's here today for preoperative evaluation prior to having the surgery.  September 04, 2012:  [name] is seen today for a follow up visit. She saw Richardson Dopp in April and was started on bystolic. She has tolerated that fairly well. She has some episodes of CP that are constant. She is very stable.   03/01/2013:  She continues to have significant CP. Still short of breath. She has no energy. Just does not feel well in general.  She does not get much sleep - perhaps 3 hours a night.   Jan. 8, 2016:  [name] is a 74 yo who we follow for CAD, DM, HTN, hyperlipidemia, . She has COPD: FEV1 equals 0.89 L which is 48% of predicted value. Her DLCO is also markedly depressed. She has severe  obstructive lung disease with a beneficial response to bronchodilators.  Breathing is the same. Has some upper respiratory issues.  Still has some tightness.    Jun 17, 2014:    [name] is a 74 y.o. female who presents for episodes of CP    Started 1 1/2 months ago.  Has tried mylanta - thought it was gas.  Did not get any better Occurs wjith exertion or at rest. , left sided chest pain   No radiation Gets lightheaded. Has tried NTG which seems to help.    Past Medical History  Diagnosis Date  . Coronary artery disease   . Diabetes mellitus   . Hypertension   . Hyperlipidemia   . Shoulder injury     resolved after shoulder surgery  . Chest pain   . Acute myocardial infarction, unspecified site, episode of care unspecified   . Unspecified asthma(493.90)   . PONV (postoperative nausea and vomiting)   . Anxiety   . GERD (gastroesophageal reflux disease)   . Upper respiratory symptom     02-13-14 "runny nose, mild cough,sniffles" -instructed to inform MD if symptoms worsens    Past Surgical History  Procedure Laterality Date  . Cardiac catheterization  07/23/2009    EF 60%  . Cardiac catheterization  10/11/2008  . Cardiac catheterization  03/01/2007    EF 75-80%  . Coronary artery bypass graft      SEVERELY DISEASED SAPHENOUS  VEIN GRAFT TO THE RIGHT CORONARY ARTERY BUT WITH FAIRLY WELL PRESERVED FLOW TO THE DISTAL RIGHT CORONARY ARTERY FROM THE NATIVE CIRCULATION-RESTART  CATH IN JUNE 2000, REVEALS MILD/MODERATE  CAD WITH GOOD FLOW DOWN HER LAD  . US echocardiography  03/08/2008    EF 55-60%  . Cardiovascular stress test  11/15/2007    EF 60%  . Rotator cuff repair      right and left  . Tumor removed kidney    . Tonsillectomy      age 51  . Abdominal hysterectomy    . Appendectomy      came out with Hysterectomy  . Eye surgery      bilateral cataract surgery with lens implant  . Total knee arthroplasty Right 02/20/2014    Procedure: RIGHT TOTAL KNEE ARTHROPLASTY;   Surgeon: Tobi Bastos, MD;  Location: [facility];  Service: Orthopedics;  Laterality: Right;     Current Outpatient Prescriptions  Medication Sig Dispense Refill  . albuterol (PROVENTIL HFA;VENTOLIN HFA) 108 (90 BASE) MCG/ACT inhaler Inhale 2 puffs into the lungs every 6 (six) hours as needed. For shortness of breath. 1 Inhaler 0  . albuterol (PROVENTIL) (2.5 MG/3ML) 0.083% nebulizer solution Take 3 mLs (2.5 mg total) by nebulization every 6 (six) hours as needed. For shortness of breath. 75 mL 2  . ALPRAZolam (XANAX) 0.25 MG tablet Take 0.25 mg by mouth 2 (two) times daily as needed.     . baclofen (LIORESAL) 20 MG tablet Take 1 tablet by mouth 2 (two) times daily.    . beclomethasone (QVAR) 80 MCG/ACT inhaler Inhale 3 puffs into the lungs 2 (two) times daily. 2 Inhaler 0  . Calcium & Magnesium Carbonates (MYLANTA PO) Take 1 tablet by mouth daily as needed. For indigestion.    . canagliflozin (INVOKANA) 300 MG TABS tablet Take 300 mg by mouth daily before breakfast.    . Cholecalciferol (VITAMIN D) 2000 UNITS tablet Take 2,000 Units by mouth daily.     . clopidogrel (PLAVIX) 75 MG tablet Take 75 mg by mouth every morning.    . diltiazem (CARDIZEM CD) 240 MG 24 hr capsule TAKE ONE CAPSULE BY MOUTH EVERY DAY 30 capsule 10  . doxazosin (CARDURA) 8 MG tablet Take 1 tablet (8 mg total) by mouth daily. 30 tablet 11  . famotidine (PEPCID) 20 MG tablet Take 20 mg by mouth 2 (two) times daily.    . ferrous sulfate 325 (65 FE) MG tablet Take 1 tablet (325 mg total) by mouth 3 (three) times daily after meals. 30 tablet 0  . Fluticasone Furoate-Vilanterol (BREO ELLIPTA) 200-25 MCG/INH AEPB Inhale 1 puff into the lungs daily. 2 each 0  . furosemide (LASIX) 40 MG tablet Take 40 mg by mouth daily.     Marland Kitchen HYDROmorphone (DILAUDID) 2 MG tablet Take 1 tablet (2 mg total) by mouth every 4 (four) hours as needed for severe pain. 80 tablet 0  . insulin lispro protamine-insulin lispro (HUMALOG 75/25) (75-25) 100  UNIT/ML SUSP Inject 20-27 Units into the skin 2 (two) times daily with a meal. 20 in am, 27 at dinner    . meclizine (ANTIVERT) 25 MG tablet Take 25 mg by mouth as needed for dizziness (dizziness).     . metoprolol succinate (TOPROL-XL) 50 MG 24 hr tablet Take 1 tablet by mouth daily.    . Multiple Vitamin (MULTIVITAMIN WITH MINERALS) TABS Take 1 tablet by mouth daily.    . Nebulizers (COMPRESSOR/NEBULIZER) MISC Use with albuterol 1  each 0  . nitroGLYCERIN (NITROSTAT) 0.4 MG SL tablet Place 0.4 mg under the tongue every 5 (five) minutes x 3 doses as needed. For chest pain.    Marland Kitchen omeprazole (PRILOSEC) 20 MG capsule Take 20 mg by mouth daily.    Marland Kitchen omeprazole (PRILOSEC) 20 MG capsule TAKE ONE CAPSULE BY MOUTH EVERY DAY AS NEEDED 30 capsule 10  . potassium chloride SA (K-DUR,KLOR-CON) 20 MEQ tablet Take 20 mEq by mouth 2 (two) times daily.     No current facility-administered medications for this visit.    Allergies:   Amlodipine; Banana; Co q10; Codeine; Morphine; and Sulfonamide derivatives    Social History:  The patient  reports that she quit smoking about 38 years ago. Her smoking use included Cigarettes. She has a 12.5 pack-year smoking history. She has never used smokeless tobacco. She reports that she does not drink alcohol or use illicit drugs.   Family History:  The patient's family history includes Emphysema in her paternal uncle; Esophageal cancer in her brother; Heart disease in her maternal grandfather.    ROS:  Please see the history of present illness.    Review of Systems: Constitutional:  denies fever, chills, diaphoresis, appetite change and fatigue.  HEENT: denies photophobia, eye pain, redness, hearing loss, ear pain, congestion, sore throat, rhinorrhea, sneezing, neck pain, neck stiffness and tinnitus.  Respiratory: denies SOB, DOE, cough, chest tightness, and wheezing.  Cardiovascular: denies chest pain, palpitations and mild  leg swelling.  Gastrointestinal: denies  nausea, vomiting, abdominal pain, diarrhea, constipation, blood in stool.  Genitourinary: denies dysuria, urgency, frequency, hematuria, flank pain and difficulty urinating.  Musculoskeletal: denies  myalgias, back pain, joint swelling, arthralgias and gait problem.   Skin: denies pallor, rash and wound.  Neurological: denies dizziness, seizures, syncope, weakness, light-headedness, numbness and headaches.   Hematological: denies adenopathy, easy bruising, personal or family bleeding history.  Psychiatric/ Behavioral: denies suicidal ideation, mood changes, confusion, nervousness, sleep disturbance and agitation.       All other systems are reviewed and negative.    PHYSICAL EXAM: VS:  There were no vitals taken for this visit. , BMI There is no weight on file to calculate BMI. GEN: Well nourished, well developed, in no acute distress HEENT: normal Neck: no JVD, carotid bruits, or masses Cardiac: RRR; no murmurs, rubs, or gallops,no edema  Respiratory:  clear to auscultation bilaterally, normal work of breathing GI: soft, nontender, nondistended, + BS MS: no deformity or atrophy Skin: warm and dry, no rash Neuro:  Strength and sensation are intact Psych: normal   EKG:  EKG is ordered today. The ekg ordered today demonstrates  NSR at 83.  RBBB.    Recent Labs: 02/13/2014: ALT 14 02/22/2014: BUN 26*; Creatinine 0.81; Hemoglobin 8.8*; Platelets 216; Potassium 4.8; Sodium 137    Lipid Panel    Component Value Date/Time   CHOL 161 10/06/2011 0500   TRIG 109 10/06/2011 0500   HDL 37* 10/06/2011 0500   CHOLHDL 4.4 10/06/2011 0500   VLDL 22 10/06/2011 0500   LDLCALC 102* 10/06/2011 0500      Wt Readings from Last 3 Encounters:  04/24/14 191 lb (86.637 kg)  02/20/14 203 lb (92.08 kg)  02/15/14 203 lb (92.08 kg)      Other studies Reviewed: Additional studies/ records that were reviewed today include: . Review of the above records demonstrates:    ASSESSMENT AND  PLAN:  1. Coronary artery disease-status post CABG, the saphenous vein graft to the right coronary artery  is severely diseased but she has good flow to the distal right coronary artery from the native circulation. She presents with recurrent CP - different from her previous CP .  Responds to SL NTG.   Will get a [name].   2. Diabetes mellitus 3. Hypertension 4. Hyperlipidemia 5. COPD: June, 2013 - FEV1 equals 0.89 L which is 48% of predicted value. Her DLCO is also markedly depressed. 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   Current medicines are reviewed at length with the patient today.  The patient does not have concerns regarding medicines.  The following changes have been made:  no change  Labs/ tests ordered today include:  No orders of the defined types were placed in this encounter.     Disposition:   FU with me in 6 months       Nahser, Wonda Cheng, MD  06/17/2014 1:07 PM    [facility] [address], [place], [place]  [address] Phone: [phone]; Fax: [phone]   [facility]  [address] [place], [place]  [address] [phone]    Fax [phone]

## 2014-06-17 NOTE — Patient Instructions (Signed)
Medication Instructions:  START Imdur 30 mg once daily  Labwork: None today  Testing/Procedures: Your physician has requested that you have a lexiscan myoview. For further information please visit [email or web address]. Please follow instruction sheet, as given.   Follow-Up: Your physician wants you to follow-up in: 6 months with Dr. Acie Fredrickson.  You will receive a reminder letter in the mail two months in advance. If you don't receive a letter, please call our office to schedule the follow-up appointment.

## 2014-06-25 ENCOUNTER — Ambulatory Visit (HOSPITAL_COMMUNITY): Payer: Medicare Other | Attending: Cardiovascular Disease

## 2014-06-25 VITALS — Ht 63.0 in | Wt 191.0 lb

## 2014-06-25 DIAGNOSIS — G4440 Drug-induced headache, not elsewhere classified, not intractable: Secondary | ICD-10-CM

## 2014-06-25 DIAGNOSIS — I25119 Atherosclerotic heart disease of native coronary artery with unspecified angina pectoris: Secondary | ICD-10-CM | POA: Diagnosis present

## 2014-06-25 DIAGNOSIS — R0602 Shortness of breath: Secondary | ICD-10-CM

## 2014-06-25 LAB — MYOCARDIAL PERFUSION IMAGING
CHL CUP NUCLEAR SSS: 7
CHL CUP RESTING HR STRESS: 75 {beats}/min
CHL CUP STRESS STAGE 1 GRADE: 0 %
CHL CUP STRESS STAGE 2 SPEED: 0 mph
CHL CUP STRESS STAGE 3 DBP: 48 mmHg
CHL CUP STRESS STAGE 3 GRADE: 0 %
CHL CUP STRESS STAGE 3 HR: 95 {beats}/min
CHL CUP STRESS STAGE 5 SPEED: 0 mph
CHL CUP STRESS STAGE 6 DBP: 75 mmHg
CSEPPMHR: 68 %
Estimated workload: 1 METS
LHR: 0.26
LV dias vol: 88 mL
LV sys vol: 34 mL
Nuc Stress EF: 61 %
Peak HR: 99 {beats}/min
SDS: 3
SRS: 4
Stage 1 DBP: 71 mmHg
Stage 1 HR: 78 {beats}/min
Stage 1 SBP: 142 mmHg
Stage 1 Speed: 0 mph
Stage 2 Grade: 0 %
Stage 2 HR: 78 {beats}/min
Stage 3 SBP: 148 mmHg
Stage 3 Speed: 0 mph
Stage 4 Grade: 0 %
Stage 4 HR: 99 {beats}/min
Stage 4 Speed: 0 mph
Stage 5 DBP: 46 mmHg
Stage 5 Grade: 0 %
Stage 5 HR: 93 {beats}/min
Stage 5 SBP: 92 mmHg
Stage 6 Grade: 0 %
Stage 6 HR: 83 {beats}/min
Stage 6 SBP: 112 mmHg
Stage 6 Speed: 0 mph
TID: 0.79

## 2014-06-25 MED ORDER — REGADENOSON 0.4 MG/5ML IV SOLN
0.4000 mg | Freq: Once | INTRAVENOUS | Status: AC
  Administered 2014-06-25: 0.4 mg via INTRAVENOUS

## 2014-06-25 MED ORDER — AMINOPHYLLINE 25 MG/ML IV SOLN
75.0000 mg | Freq: Once | INTRAVENOUS | Status: AC
  Administered 2014-06-25: 75 mg via INTRAVENOUS

## 2014-06-25 MED ORDER — TECHNETIUM TC 99M SESTAMIBI GENERIC - CARDIOLITE
33.0000 | Freq: Once | INTRAVENOUS | Status: AC | PRN
  Administered 2014-06-25: 33 via INTRAVENOUS

## 2014-06-25 MED ORDER — TECHNETIUM TC 99M SESTAMIBI GENERIC - CARDIOLITE
11.0000 | Freq: Once | INTRAVENOUS | Status: AC | PRN
  Administered 2014-06-25: 11 via INTRAVENOUS

## 2014-08-13 ENCOUNTER — Other Ambulatory Visit: Payer: Self-pay | Admitting: *Deleted

## 2014-08-13 MED ORDER — OMEPRAZOLE 20 MG PO CPDR: 20.0000 mg | DELAYED_RELEASE_CAPSULE | Freq: Every day | ORAL | Status: DC

## 2014-08-13 MED ORDER — DOXAZOSIN MESYLATE 8 MG PO TABS: 8.0000 mg | ORAL_TABLET | Freq: Every day | ORAL | Status: DC

## 2014-08-13 MED ORDER — DILTIAZEM HCL ER COATED BEADS 240 MG PO CP24: 240.0000 mg | ORAL_CAPSULE | Freq: Every day | ORAL | Status: DC

## 2014-08-28 ENCOUNTER — Encounter: Payer: Self-pay | Admitting: Neurology

## 2014-08-28 ENCOUNTER — Ambulatory Visit (INDEPENDENT_AMBULATORY_CARE_PROVIDER_SITE_OTHER): Payer: Medicare Other | Admitting: Neurology

## 2014-08-28 VITALS — BP 117/65 | HR 65 | Temp 97.9°F | Ht 63.0 in | Wt 196.2 lb

## 2014-08-28 DIAGNOSIS — R296 Repeated falls: Secondary | ICD-10-CM | POA: Insufficient documentation

## 2014-08-28 DIAGNOSIS — R269 Unspecified abnormalities of gait and mobility: Secondary | ICD-10-CM

## 2014-08-28 DIAGNOSIS — H539 Unspecified visual disturbance: Secondary | ICD-10-CM | POA: Diagnosis not present

## 2014-08-28 DIAGNOSIS — M542 Cervicalgia: Secondary | ICD-10-CM | POA: Insufficient documentation

## 2014-08-28 DIAGNOSIS — W19XXXA Unspecified fall, initial encounter: Secondary | ICD-10-CM | POA: Insufficient documentation

## 2014-08-28 DIAGNOSIS — R159 Full incontinence of feces: Secondary | ICD-10-CM

## 2014-08-28 DIAGNOSIS — G8929 Other chronic pain: Secondary | ICD-10-CM | POA: Diagnosis not present

## 2014-08-28 DIAGNOSIS — R51 Headache: Secondary | ICD-10-CM

## 2014-08-28 DIAGNOSIS — R519 Headache, unspecified: Secondary | ICD-10-CM

## 2014-08-28 NOTE — Patient Instructions (Signed)
Overall you are doing fairly well but I do want to suggest a few things today:   Remember to drink plenty of fluid, eat healthy meals and do not skip any meals. Try to eat protein with a every meal and eat a healthy snack such as fruit or nuts in between meals. Try to keep a regular sleep-wake schedule and try to exercise daily, particularly in the form of walking, 20-30 minutes a day, if you can.   As far as diagnostic testing: MRi of the lumbar spine and cervical spine  I would like to see you back after imaging, sooner if we need to. Please call us with any interim questions, concerns, problems, updates or refill requests.   Please also call us for any test results so we can go over those with you on the phone.  My clinical assistant and will answer any of your questions and relay your messages to me and also relay most of my messages to you.   Our phone number is [phone]. We also have an after hours call service for urgent matters and there is a physician on-call for urgent questions. For any emergencies you know to call 911 or go to the nearest emergency room

## 2014-08-28 NOTE — Progress Notes (Signed)
[facility]    Provider:  Dr Jaynee Eagles Referring Provider: Willey Blade, MD Primary Care Physician:  Salena Saner., MD  CC:  Gait dysfunction  HPI:  [name] is a 74 y.o. female here as a referral from Dr. Karlton Lemon for gait dysfunction and fecal incontinence. PMHx of NASH, DM, hld, obesity, b12 deficiency, HTN.   She is having gait dysfunction, imbalance and bowel incontinence. She has a bad knee and had surgery on the knee but the walking problem happened before then. She had an episode with vertigo a few weeks ago and she never really recovered. She still has episodic vertigo. Worse when she doesn't get enough sleep, she has room spinning, objects moving when she is dizzy. But that doesn't happen often. She feels like she does not have balance. She fell 2 weeks ago. She says her knee gave way on the first fall. The second fall she also felt like the right knee gave out. Last week, she got up to get the phone and fell flat on her face. She is supposed to be using a walking aid and has not been using it. She does not have a walking aid with her today, she left it in the car.  She denies back pain. She is having bowel incontinence, started last year. She has not been to gastrtroenterology. The stool is just there, she doesn't even know she is having leakage. She will find soft, formed stool in her underwear. She has 3-4 bowel movements a day and if she is regular she is fine, but if she miss a bowel movement in the morning she may have bowel incontinence. No pain, no urge, happens every day. She wakes up every single day with a headache. Not excessively tired during the day. No numbness or tinging in the toes. No LBP or radicular symptoms. She has neck pain. Denies new medications or laxatives.    Reviewed notes, labs and imaging from outside physicians, which showed; patient referred due to suspicion of neurologic cause of fecal incontinence.   MRI/MRA brain and head  2013 (personally reviewed images): IMPRESSION: 1. Difficult to exclude two tiny acute or subacute cortically based infarcts in the bilateral parietal lobes (see series 4 images 21 and 24) although these could be image noise. 2. Otherwise no acute intracranial abnormality and stable/negative noncontrast MRI appearance of the brain.  IMPRESSION: 1. 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 2. Otherwise negative intracranial MRA which is intermittently degraded by motion.  BUN 17, creatinine 0.71 07/30/2014 lfts unremarkable ldl 138 hgba1c 6.8   Review of Systems: Patient complains of symptoms per HPI as well as the following symptoms: Wheezing, headache, decreased energy. Pertinent negatives per HPI. All others negative.   History   Social History  . Marital Status: Widowed    Spouse Name: N/A  . Number of Children: 4  . Years of Education: Doctorate   Occupational History  . Retired    Social History Main Topics  . Smoking status: Former Smoker -- 0.50 packs/day for 25 years    Types: Cigarettes    Quit date: 02/09/1976  . Smokeless tobacco: Never Used  . Alcohol Use: No  . Drug Use: No  . Sexual Activity: Not on file   Other Topics Concern  . Not on file   Social History Narrative   Lives alone.   Caffeine use: Drinks 1 cup coffee/day    Family History  Problem Relation Age of Onset  . Emphysema Paternal Uncle   . Heart disease Maternal Grandfather   . Heart failure Maternal Grandfather   . Esophageal cancer Brother   . Heart attack Father   . Stomach cancer Father     Past Medical History  Diagnosis Date  . Coronary artery disease   . Diabetes mellitus   . Hypertension   . Hyperlipidemia   . Shoulder injury     resolved after shoulder surgery  . Chest pain   . Acute myocardial infarction, unspecified site, episode of care unspecified   . Unspecified asthma(493.90)     . PONV (postoperative nausea and vomiting)   . Anxiety   . GERD (gastroesophageal reflux disease)   . Upper respiratory symptom     02-13-14 "runny nose, mild cough,sniffles" -instructed to inform MD if symptoms worsens    Past Surgical History  Procedure Laterality Date  . Cardiac catheterization  07/23/2009    EF 60%  . Cardiac catheterization  10/11/2008  . Cardiac catheterization  03/01/2007    EF 75-80%  . Coronary artery bypass graft      SEVERELY DISEASED SAPHENOUS VEIN GRAFT TO THE RIGHT CORONARY ARTERY BUT WITH FAIRLY WELL PRESERVED FLOW TO THE DISTAL RIGHT CORONARY ARTERY FROM THE NATIVE CIRCULATION-RESTART  CATH IN JUNE 2000, REVEALS MILD/MODERATE  CAD WITH GOOD FLOW DOWN HER LAD  . US echocardiography  03/08/2008    EF 55-60%  . Cardiovascular stress test  11/15/2007    EF 60%  . Rotator cuff repair      right and left  . Tumor removed kidney    . Tonsillectomy      age 48  . Abdominal hysterectomy    . Appendectomy      came out with Hysterectomy  . Eye surgery      bilateral cataract surgery with lens implant  . Total knee arthroplasty Right 02/20/2014    Procedure: RIGHT TOTAL KNEE ARTHROPLASTY;  Surgeon: Tobi Bastos, MD;  Location: [facility];  Service: Orthopedics;  Laterality: Right;    Current Outpatient Prescriptions  Medication Sig Dispense Refill  . albuterol (PROVENTIL HFA;VENTOLIN HFA) 108 (90 BASE) MCG/ACT inhaler Inhale 2 puffs into the lungs every 6 (six) hours as needed. For shortness of breath. 1 Inhaler 0  . albuterol (PROVENTIL) (2.5 MG/3ML) 0.083% nebulizer solution Take 3 mLs (2.5 mg total) by nebulization every 6 (six) hours as needed. For shortness of breath. 75 mL 2  . ALPRAZolam (XANAX) 0.25 MG tablet Take 0.25 mg by mouth 2 (two) times daily as needed.     . baclofen (LIORESAL) 20 MG tablet Take 1 tablet by mouth 2 (two) times daily.    . beclomethasone (QVAR) 80 MCG/ACT inhaler Inhale 3 puffs into the lungs 2 (two) times daily. 2 Inhaler 0  .  Calcium & Magnesium Carbonates (MYLANTA PO) Take 1 tablet by mouth daily as needed. For indigestion.    . canagliflozin (INVOKANA) 300 MG TABS tablet Take 300 mg by mouth daily before breakfast.    . Cholecalciferol (VITAMIN D) 2000 UNITS tablet Take 2,000 Units by mouth daily.     . clopidogrel (PLAVIX) 75 MG tablet Take 75 mg by mouth every morning.    . Cyanocobalamin (VITAMIN B-12 IJ) Inject as directed every 30 (thirty) days.    Marland Kitchen diltiazem (CARDIZEM CD) 240 MG 24 hr capsule Take 1 capsule (240 mg total) by mouth daily. 30 capsule 10  . doxazosin (CARDURA) 8 MG tablet  Take 1 tablet (8 mg total) by mouth daily. 30 tablet 7  . famotidine (PEPCID) 20 MG tablet Take 20 mg by mouth 2 (two) times daily.    . ferrous sulfate 325 (65 FE) MG tablet Take 1 tablet (325 mg total) by mouth 3 (three) times daily after meals. 30 tablet 0  . Fluticasone Furoate-Vilanterol (BREO ELLIPTA) 200-25 MCG/INH AEPB Inhale 1 puff into the lungs daily. 2 each 0  . furosemide (LASIX) 40 MG tablet Take 40 mg by mouth every other day.     Marland Kitchen HYDROmorphone (DILAUDID) 2 MG tablet Take 1 tablet (2 mg total) by mouth every 4 (four) hours as needed for severe pain. 80 tablet 0  . insulin lispro protamine-insulin lispro (HUMALOG 75/25) (75-25) 100 UNIT/ML SUSP Inject 20-27 Units into the skin 2 (two) times daily with a meal. 20 in am, 27 at dinner    . isosorbide mononitrate (IMDUR) 30 MG 24 hr tablet Take 1 tablet (30 mg total) by mouth daily. 31 tablet 11  . meclizine (ANTIVERT) 25 MG tablet Take 25 mg by mouth as needed for dizziness (dizziness).     . metoprolol succinate (TOPROL-XL) 50 MG 24 hr tablet Take 1 tablet by mouth daily.    . Multiple Vitamin (MULTIVITAMIN WITH MINERALS) TABS Take 1 tablet by mouth daily.    . Nebulizers (COMPRESSOR/NEBULIZER) MISC Use with albuterol 1 each 0  . nitroGLYCERIN (NITROSTAT) 0.4 MG SL tablet Place 0.4 mg under the tongue every 5 (five) minutes x 3 doses as needed. For chest pain.    Marland Kitchen  omeprazole (PRILOSEC) 20 MG capsule Take 1 capsule (20 mg total) by mouth daily. 30 capsule 6  . potassium chloride SA (K-DUR,KLOR-CON) 20 MEQ tablet Take 20 mEq by mouth 2 (two) times daily.    . BELSOMRA 5 MG TABS Take 5 mg by mouth daily.  2   No current facility-administered medications for this visit.    Allergies as of 08/28/2014 - Review Complete 08/28/2014  Allergen Reaction Noted  . Amlodipine  08/20/2010  . Banana Nausea And Vomiting 11/01/2011  . Co q10 [coenzyme q10]    . Codeine    . Morphine Other (See Comments)   . Sulfonamide derivatives Swelling     Vitals: BP 117/65 mmHg  Pulse 65  Temp(Src) 97.9 F (36.6 C) (Oral)  Ht 5\' 3"  (1.6 m)  Wt 196 lb 3.2 oz (88.996 kg)  BMI 34.76 kg/m2 Last Weight:  Wt Readings from Last 1 Encounters:  08/28/14 196 lb 3.2 oz (88.996 kg)   Last Height:   Ht Readings from Last 1 Encounters:  08/28/14 5\' 3"  (1.6 m)   Physical exam: Exam: Gen: NAD, conversant, well nourised, obese, well groomed                     CV: RRR, no MRG. No Carotid Bruits. No peripheral edema, warm, nontender Eyes: Conjunctivae clear without exudates or hemorrhage  Neuro: Detailed Neurologic Exam  Speech:    Speech is normal; fluent and spontaneous with normal comprehension.  Cognition:    The patient is oriented to person, place, and time;     recent and remote memory intact;     language fluent;     normal attention, concentration,     fund of knowledge Cranial Nerves:    The pupils are equal, round, and reactive to light. The fundi are normal and flat. Visual fields are full to finger confrontation. Extraocular movements are intact. Trigeminal  sensation is intact and the muscles of mastication are normal. The face is symmetric. The palate elevates in the midline. Hearing intact. Voice is normal. Shoulder shrug is normal. The tongue has normal motion without fasciculations.   Coordination:    No dysmetria  Gait:    Not using walking aid, not  shuffling, not significantly ataxic  Motor Observation:    No asymmetry, no atrophy, and no involuntary movements noted. Tone:    Normal muscle tone.    Posture:    Posture is normal.     Strength:    Strength is V/V in the upper and lower limbs.      Sensation: intact to LT. Romberg negative.  Intact to pp, temp distally. Few seconds vibration at the toes and mild impaired proprioception at the great toes.     Reflex Exam:  DTR's:    Deep tendon reflexes in the lowers are hyporeflexic, uppers symmetric and within normal limits Toes:    Left equiv, right down.  Clonus:    Clonus is absent.       Assessment/Plan:  74 year old female with bowel incontinence, ataxia, falls.  Suspect falls are multifactorial including knee replacement in combination with peripheral neuropathy. However need to evaluate c-spine, l-spine and mri of the brain for additional etiologies of falls and ataxia.  Bowel incontinence - Will order an mri of the lumbar spine and an mri of the cervical spine to eval for neurogenic causes.    PCP may consider referral to Gastroenterology for eval of Ano and colorectal disease as clinically warranted.   Encouraged continuous walking aid use. Fall risk.     Sarina Ill, MD  [facility] [address] [place], [place] [address]  Phone [phone] Fax [phone]

## 2014-09-01 ENCOUNTER — Encounter: Payer: Self-pay | Admitting: Neurology

## 2014-09-04 ENCOUNTER — Telehealth: Payer: Self-pay | Admitting: Cardiovascular Disease

## 2014-09-04 NOTE — Telephone Encounter (Signed)
Left detailed message of requested information on [name]'s confidential voice mail and advised her to call back with questions or concerns

## 2014-09-04 NOTE — Telephone Encounter (Signed)
New message      Need to confirm CHF diagnosis, get recent ejection fraction and recent vital signs

## 2014-09-16 ENCOUNTER — Ambulatory Visit
Admission: RE | Admit: 2014-09-16 | Discharge: 2014-09-16 | Disposition: A | Discharge status: Home / Self Care | Payer: Medicare Other | Source: Ambulatory Visit | Attending: Neurology | Admitting: Neurology

## 2014-09-16 ENCOUNTER — Telehealth: Payer: Self-pay | Admitting: Neurology

## 2014-09-16 DIAGNOSIS — R159 Full incontinence of feces: Secondary | ICD-10-CM

## 2014-09-16 DIAGNOSIS — R51 Headache: Principal | ICD-10-CM

## 2014-09-16 DIAGNOSIS — R269 Unspecified abnormalities of gait and mobility: Secondary | ICD-10-CM

## 2014-09-16 DIAGNOSIS — M542 Cervicalgia: Secondary | ICD-10-CM

## 2014-09-16 DIAGNOSIS — G8929 Other chronic pain: Secondary | ICD-10-CM

## 2014-09-16 DIAGNOSIS — R519 Headache, unspecified: Secondary | ICD-10-CM

## 2014-09-16 DIAGNOSIS — W19XXXA Unspecified fall, initial encounter: Secondary | ICD-10-CM

## 2014-09-16 DIAGNOSIS — H539 Unspecified visual disturbance: Secondary | ICD-10-CM

## 2014-09-16 NOTE — Telephone Encounter (Signed)
Spoke to patient. Mri of the brain was normal for age. MRi of the cervical cord and lumbar spine with multi-level degenerative disease but nothing to point to a neurologic cause for fecal incontinence. PCP may consider referral to Gastroenterology for eval of Ano and colorectal disease as clinically warranted. If that workup is negative we can consider imaging the Thoracic spine. Called Dr. Joelene Millin Shelton's office and she is out until Monday, will cc her on this.  MRi of the brain: This is a normal age-appropriate MRI of the brain without contrast. There are no acute findings. Incidental note is made of a "partially empty" sella turcica, unchanged when compared to 2013.  MRi of the cervical cord:  IMPRESSION: This is an abnormal MRI of the cervical spine showing multilevel degenerative changes as detailed above. The most significant findings are: 1. Stable left greater than right uncovertebral spurring at C3-C4 causing moderate left foraminal narrowing is no nerve root compression but there is some encroachment upon the exiting left C4 nerve root.  2. Mild anterolisthesis at C4-C5 associated with mild facet hypertrophy. There is severe right uncovertebral spurring causing moderate foraminal narrowing. There is no definite nerve root compression though there is some encroachment upon the right C5 nerve root. The anterolisthesis has progressed slightly when compared to 2006. 3. Mild anterolisthesis of C5-C6 associated with mild facet hypertrophy. There does not appear to be nerve root impingement at this level. The anterolisthesis has progressed slightly when compared to 2006. 4. Disc bulging and uncovertebral spurring associated with moderately severe loss of disc height at C6-C7. There is moderate bilateral foraminal narrowing. Although there is no definite nerve root compression, there is some encroachment upon the exiting C7 nerve roots.  MRI Lumbar Spine:  IMPRESSION: This is an abnormal  MRI of the lumbar spine showed the following: 1. Normal conus medullaris and cauda equina. 2. Moderate facet hypertrophy at L3-L4 with joint effusions, severe facet hypertrophy at L4-L5 with large joint effusions and moderate facet hypertrophy at L5-S1 with a small right joint effusion. At L4-L5, there is right greater than left lateral recess stenosis. There does not appear to be any definite nerve root compression though there is some encroachment upon both of the traversing L5 nerve roots.

## 2014-10-08 ENCOUNTER — Ambulatory Visit (INDEPENDENT_AMBULATORY_CARE_PROVIDER_SITE_OTHER): Payer: Medicare Other | Admitting: Pulmonary Disease

## 2014-10-08 ENCOUNTER — Encounter: Payer: Self-pay | Admitting: Pulmonary Disease

## 2014-10-08 ENCOUNTER — Other Ambulatory Visit (INDEPENDENT_AMBULATORY_CARE_PROVIDER_SITE_OTHER): Payer: Medicare Other

## 2014-10-08 VITALS — BP 108/56 | HR 59 | Ht 63.0 in | Wt 198.2 lb

## 2014-10-08 DIAGNOSIS — J45909 Unspecified asthma, uncomplicated: Secondary | ICD-10-CM | POA: Diagnosis not present

## 2014-10-08 DIAGNOSIS — J302 Other seasonal allergic rhinitis: Secondary | ICD-10-CM | POA: Diagnosis not present

## 2014-10-08 DIAGNOSIS — G4734 Idiopathic sleep related nonobstructive alveolar hypoventilation: Secondary | ICD-10-CM

## 2014-10-08 DIAGNOSIS — K219 Gastro-esophageal reflux disease without esophagitis: Secondary | ICD-10-CM | POA: Diagnosis not present

## 2014-10-08 DIAGNOSIS — R0789 Other chest pain: Secondary | ICD-10-CM

## 2014-10-08 LAB — CBC WITH DIFFERENTIAL/PLATELET
BASOS PCT: 0.6 % (ref 0.0–3.0)
Basophils Absolute: 0 10*3/uL (ref 0.0–0.1)
EOS PCT: 4.5 % (ref 0.0–5.0)
Eosinophils Absolute: 0.3 10*3/uL (ref 0.0–0.7)
HCT: 36 % (ref 36.0–46.0)
Hemoglobin: 11.3 g/dL — ABNORMAL LOW (ref 12.0–15.0)
LYMPHS ABS: 1.8 10*3/uL (ref 0.7–4.0)
Lymphocytes Relative: 28.9 % (ref 12.0–46.0)
MCHC: 31.5 g/dL (ref 30.0–36.0)
MCV: 75.1 fl — AB (ref 78.0–100.0)
MONO ABS: 0.7 10*3/uL (ref 0.1–1.0)
MONOS PCT: 10.7 % (ref 3.0–12.0)
NEUTROS PCT: 55.3 % (ref 43.0–77.0)
Neutro Abs: 3.4 10*3/uL (ref 1.4–7.7)
Platelets: 293 10*3/uL (ref 150.0–400.0)
RBC: 4.79 Mil/uL (ref 3.87–5.11)
RDW: 18.8 % — AB (ref 11.5–15.5)
WBC: 6.1 10*3/uL (ref 4.0–10.5)

## 2014-10-08 MED ORDER — MONTELUKAST SODIUM 5 MG PO CHEW: 5.0000 mg | CHEWABLE_TABLET | Freq: Every day | ORAL | Status: DC

## 2014-10-08 NOTE — Progress Notes (Signed)
Subjective:    Patient ID: [name], female    DOB: 10/29/1940, 74 y.o.   MRN: [id number]  C.C.:  Follow-up for Asthma, Nocturnal Hypoxia, & GERD.  HPI Asthma:  She denies any breathing problems or recurrent infections as a child. She reports she started having recurrent bronchitis in her late 21s. She reports she was getting "bronchitis" in the Fall & Spring. She reports she continues to have "bronchitis" in the Fall & Spring. She describes coughing & wheezing with these episodes as well as fatigue. She usually is treated with an IM injection of steroids. She reports she wheezes on a routine, daily basis. She denies any coughing. She reports previously she enjoyed walking but hasn't been able to walk due to dyspnea. She admits she hasn't been able to do water aerobics since her last knee surgery. She primarily uses samples. She uses Breo daily as well as Qvar bid. She reports she rarely uses her rescue inhaler or albuterol via nebulizer. No nocturnal awakenings with any coughing or wheezing. Previously was on Advair but switched off.   Nocturnal Hypoxia:  Previously was prescribed oxygen at 5 L/m while sleeping. Previously was waking up 2-3 times a night until she started on Belsomra. She reports she has a morning headache every day when she wakes up. She reports good quality of sleep otherwise. She denies any daytime napping. She denies any dozing. She is scheduled for a polysomnogram next month per her report. She is unsure if she snores because she sleeps alone.   GERD:  Compliant with Pepcid bid. She reports she takes Prilosec/Omeprazole intermittently depending on her diet. She reports reflux into her chest 1-2 days a week depending on her diet. She does have morning brash water 1-2 times a week again depending on her diet. She does have periods where she will have reflux on a daily basis for a week no matter what she eats.   Review of Systems She reports sinus headaches and pressure with  seasonal changes. She reports she has a chronic post-nasal drainage. She denies any chest pressure or tightness. She reports a "stabbing" pain in her left anterior chest occuring in the morning before she gets up. The pain doesn't wake her up. She reports it occurs every other day. The pain lasts for 30 seconds then resolves. The pain sometimes recurs around 5pm. Not exacerbated by activity.   Allergies  Allergen Reactions  . Amlodipine   . Banana Nausea And Vomiting    Stomach pumped  . Co Q10 [Coenzyme Q10]     Body cramps  . Codeine     Hallucinate, loose identity and don't know who I am  . Morphine Other (See Comments)    Can not function, it immobilizes me   . Sulfonamide Derivatives Swelling   Current Outpatient Prescriptions on File Prior to Visit  Medication Sig Dispense Refill  . albuterol (PROVENTIL HFA;VENTOLIN HFA) 108 (90 BASE) MCG/ACT inhaler Inhale 2 puffs into the lungs every 6 (six) hours as needed. For shortness of breath. 1 Inhaler 0  . albuterol (PROVENTIL) (2.5 MG/3ML) 0.083% nebulizer solution Take 3 mLs (2.5 mg total) by nebulization every 6 (six) hours as needed. For shortness of breath. 75 mL 2  . ALPRAZolam (XANAX) 0.25 MG tablet Take 0.25 mg by mouth 2 (two) times daily as needed.     . baclofen (LIORESAL) 20 MG tablet Take 1 tablet by mouth 2 (two) times daily.    . beclomethasone (QVAR) 80 MCG/ACT  inhaler Inhale 3 puffs into the lungs 2 (two) times daily. 2 Inhaler 0  . BELSOMRA 5 MG TABS Take 5 mg by mouth daily.  2  . Calcium & Magnesium Carbonates (MYLANTA PO) Take 1 tablet by mouth daily as needed. For indigestion.    . canagliflozin (INVOKANA) 300 MG TABS tablet Take 300 mg by mouth daily before breakfast.    . Cholecalciferol (VITAMIN D) 2000 UNITS tablet Take 2,000 Units by mouth daily.     . clopidogrel (PLAVIX) 75 MG tablet Take 75 mg by mouth every morning.    . Cyanocobalamin (VITAMIN B-12 IJ) Inject as directed every 30 (thirty) days.    Marland Kitchen  diltiazem (CARDIZEM CD) 240 MG 24 hr capsule Take 1 capsule (240 mg total) by mouth daily. 30 capsule 10  . doxazosin (CARDURA) 8 MG tablet Take 1 tablet (8 mg total) by mouth daily. 30 tablet 7  . famotidine (PEPCID) 20 MG tablet Take 20 mg by mouth 2 (two) times daily.    . ferrous sulfate 325 (65 FE) MG tablet Take 1 tablet (325 mg total) by mouth 3 (three) times daily after meals. 30 tablet 0  . Fluticasone Furoate-Vilanterol (BREO ELLIPTA) 200-25 MCG/INH AEPB Inhale 1 puff into the lungs daily. 2 each 0  . furosemide (LASIX) 40 MG tablet Take 40 mg by mouth every other day.     Marland Kitchen HYDROmorphone (DILAUDID) 2 MG tablet Take 1 tablet (2 mg total) by mouth every 4 (four) hours as needed for severe pain. 80 tablet 0  . insulin lispro protamine-insulin lispro (HUMALOG 75/25) (75-25) 100 UNIT/ML SUSP 15 in am, 15 in evening    . isosorbide mononitrate (IMDUR) 30 MG 24 hr tablet Take 1 tablet (30 mg total) by mouth daily. 31 tablet 11  . meclizine (ANTIVERT) 25 MG tablet Take 25 mg by mouth as needed for dizziness (dizziness).     . metoprolol succinate (TOPROL-XL) 50 MG 24 hr tablet Take 1 tablet by mouth daily.    . Multiple Vitamin (MULTIVITAMIN WITH MINERALS) TABS Take 1 tablet by mouth daily.    . nitroGLYCERIN (NITROSTAT) 0.4 MG SL tablet Place 0.4 mg under the tongue every 5 (five) minutes x 3 doses as needed. For chest pain.    Marland Kitchen omeprazole (PRILOSEC) 20 MG capsule Take 1 capsule (20 mg total) by mouth daily. 30 capsule 6  . potassium chloride SA (K-DUR,KLOR-CON) 20 MEQ tablet Take 20 mEq by mouth 2 (two) times daily.    . Nebulizers (COMPRESSOR/NEBULIZER) MISC Use with albuterol 1 each 0   No current facility-administered medications on file prior to visit.   Past Medical History  Diagnosis Date  . Coronary artery disease   . Diabetes mellitus   . Hypertension   . Hyperlipidemia   . Shoulder injury     resolved after shoulder surgery  . Chest pain   . Acute myocardial infarction,  unspecified site, episode of care unspecified   . Unspecified asthma(493.90)   . PONV (postoperative nausea and vomiting)   . Anxiety   . GERD (gastroesophageal reflux disease)   . Upper respiratory symptom     02-13-14 "runny nose, mild cough,sniffles" -instructed to inform MD if symptoms worsens   Past Surgical History  Procedure Laterality Date  . Cardiac catheterization  07/23/2009    EF 60%  . Cardiac catheterization  10/11/2008  . Cardiac catheterization  03/01/2007    EF 75-80%  . Coronary artery bypass graft      SEVERELY  DISEASED SAPHENOUS VEIN GRAFT TO THE RIGHT CORONARY ARTERY BUT WITH FAIRLY WELL PRESERVED FLOW TO THE DISTAL RIGHT CORONARY ARTERY FROM THE NATIVE CIRCULATION-RESTART  CATH IN JUNE 2000, REVEALS MILD/MODERATE  CAD WITH GOOD FLOW DOWN HER LAD  . US echocardiography  03/08/2008    EF 55-60%  . Cardiovascular stress test  11/15/2007    EF 60%  . Rotator cuff repair      right and left  . Tumor removed kidney    . Tonsillectomy      age 68  . Abdominal hysterectomy    . Appendectomy      came out with Hysterectomy  . Eye surgery      bilateral cataract surgery with lens implant  . Total knee arthroplasty Right 02/20/2014    Procedure: RIGHT TOTAL KNEE ARTHROPLASTY;  Surgeon: Tobi Bastos, MD;  Location: [facility];  Service: Orthopedics;  Laterality: Right;   Family History  Problem Relation Age of Onset  . Emphysema Paternal Uncle   . Heart disease Maternal Grandfather   . Heart failure Maternal Grandfather   . Esophageal cancer Brother   . Heart attack Father   . Stomach cancer Father   . Neuropathy Neg Hx   . Multiple sclerosis Neg Hx    Social History   Social History  . Marital Status: Widowed    Spouse Name: N/A  . Number of Children: 4  . Years of Education: Doctorate   Occupational History  . Retired    Social History Main Topics  . Smoking status: Former Smoker -- 0.50 packs/day for 25 years    Types: Cigarettes    Quit date: 02/09/1976   . Smokeless tobacco: Never Used  . Alcohol Use: No  . Drug Use: No  . Sexual Activity: Not Asked   Other Topics Concern  . None   Social History Narrative   Lives alone.   Caffeine use: Drinks 1 cup coffee/day      Objective:   Physical Exam Blood pressure 108/56, pulse 59, height 5\' 3"  (1.6 m), weight 198 lb 3.2 oz (89.903 kg), SpO2 97 %. General:  Awake. Alert. No acute distress. Central obesity. African-American female.  Integument:  Warm & dry. No rash on exposed skin. No bruising. Lymphatics:  No appreciated cervical or supraclavicular lymphadenoapthy. HEENT:  Moist mucus membranes. No oral ulcers. No scleral injection or icterus. Moderate bilateral nasal turbinate swelling with pale mucosa. PERRL. Cardiovascular:  Regular rate. No edema. No appreciable JVD.  Pulmonary:  Good aeration & clear to auscultation bilaterally. Symmetric chest wall expansion. No accessory muscle use on room air. Abdomen: Soft. Normal bowel sounds. Protuberant. Grossly nontender. Musculoskeletal:  Normal bulk and tone. Hand grip strength 5/5 bilaterally. No joint deformity or effusion appreciated. Psychiatric:  Mood and affect congruent. Speech normal rhythm, rate & tone.   PFT 11/12/13: FVC 1.57 L (74%) FEV1 1.09 L (67%) FEV1/FVC 0.70 FEF 25-75 0.72 L (50%) negative bronchodilator response 08/04/11: FVC 1.61 L (60%) FEV1 0.89 L (48%) FEV1/FVC 0.56 FEF 25-75 0.29 L (14%) positive bronchodilator response TLC 3.67 L (80%) RV 103% ERV 46% DLCO corrected 38%  IMAGING CXR PA/LAT 02/13/14 (personally reviewed by me):  Sternotomy wires from prior thoracic surgery noted. Heart normal in size. No pleural effusion appreciated. No parenchymal nodule or opacification appreciated. Mild flattening of the diaphragms bilaterally.  CARDIAC TTE (10/06/11): LV normal in size. Normal regional wall motion. EF 60-65%. Grade 1 diastolic dysfunction. LA & RA normal in size. 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 RV  normal in size and function. RVSP 19 mmHg. No aortic stenosis or regurgitation. No mitral stenosis or regurgitation. No pulmonic stenosis. Trivial tricuspid regurgitation. No pericardial effusion.  LABS 1/26/9 ABG:  7.39/44/113    Assessment & Plan:  74 year old female previously diagnosed with extrinsic asthma and also with known allergic rhinitis & GERD. Patient does continue to have intermittent symptoms of reflux which could be contributing to some of the hyperreactivity she is describing above in her airways. However, this does seem to exhibit a more seasonal pattern and is likely secondary to environmental exposure. The patient has never been tried on Singulair in the past and this may be of some benefit in control of her asthma. Alternatively, some of the newer agents such as Nucala could be of use. Given her description of sleep quality above and history of nocturnal hypoxia I feel obstructive sleep apnea is quite possible and agree with her previously scheduled polysomnogram testing. I did spend a significant amount time today reviewing the patient's prior medical history and also explained to the patient that there are significant risk factors with long-term use of high dose inhaled corticosteroids including cataracts, osteoporosis, etc. It's imperative that we attempt to adjust her regimen to better control her seasonal symptoms and allow tapering of her inhaled steroid medications. I believe the patient's atypical chest pain described above is likely secondary to her reflux. She does have a follow-up upcoming with cardiology for further evaluation. I instructed the patient to contact my office if she had any further questions or concerns before her next appointment.  1. Extrinsic asthma: Continuing patient on her current doses of Breo & Qvar. Starting Singulair 5 mg by mouth daily at bedtime. Checking serum CBC with differential, IgE, quantitative immunoglobulin panel, & RAST panel. Patient  supplied with a peak flow meter and instructed on proper technique with nursing staff. I instructed the patient to measure her peak flows twice daily and notify my office for a 20% decline. Checking full pulmonary function testing as well as 6 minute walk test on her before her next appointment. Plan to de-escalate inhaled steroid therapy after next appointment if symptoms remain stable. 2. Allergic rhinitis: Starting patient on Singulair 5 mg by mouth daily at bedtime. Advised the patient to try using nasal saline rinse twice daily. 3. Nocturnal hypoxia: Patient already scheduled for a polysomnogram. I will be interested to see the results of the study. 4. GERD: Patient counseled to continue using her Pepcid as prescribed. Also advised to take her Prilosec once daily in the morning 30 minutes before meal. Patient also counseled on appropriate dietary and lifestyle modifications. Checking barium swallow. 5. Atypical chest pain: Likely secondary to reflux. Has follow-up with Dr. Acie Fredrickson of cardiology. 6. Follow-up: Patient to return to clinic in 4-6 weeks.

## 2014-10-08 NOTE — Patient Instructions (Signed)
1. I recommend doing a saline nasal rinse twice daily. NeilMed is a good brand. 2. I'm starting you on Singulair which should help with not only your asthma but your allergies as well. 3. We are going to do breathing tests and a walking test on her before your next appointment. 4. Avoid eating within 2-3 hours of bedtime to minimize any reflux. 5. Continue taking your Pepcid twice daily as prescribed. Please start taking your omeprazole/Prilosec daily in the morning on an empty stomach, 30 minutes before you eat breakfast. 6. We are ordering a swallowing test to evaluate how severe your reflux is. 7. Continue taking your Breo & Qvar as previously instructed by Dr. Joya Gaskins. We will start to decrease the dose of your inhaled steroid after your next appointment. 8. Remember to check your peak flows twice daily and contact my office if you have a 20% reduction as this could be a sign of a coming asthma exacerbation. 9. You will return to clinic in 4-6 weeks but please contact me if you have any further questions or concerns.

## 2014-10-09 LAB — ALLERGY FULL PROFILE
Alternaria Alternata: <0.1 kU/L
Aspergillus fumigatus, m3: <0.1 kU/L
Common Ragweed: <0.1 kU/L
Elm IgE: <0.1 kU/L
Fescue: <0.1 kU/L
G005 Rye, Perennial: <0.1 kU/L
Goldenrod: <0.1 kU/L
Helminthosporium halodes: <0.1 kU/L
House Dust Hollister: <0.1 kU/L
IGE (IMMUNOGLOBULIN E), SERUM: 5 kU/L (ref <115)
Lamb's Quarters: <0.1 kU/L
Stemphylium Botryosum: <0.1 kU/L
Sycamore Tree: <0.1 kU/L
Timothy Grass: <0.1 kU/L

## 2014-10-09 LAB — IGG, IGA, IGM
IGA: 272 mg/dL (ref 69–380)
IGG (IMMUNOGLOBIN G), SERUM: 1060 mg/dL (ref 690–1700)
IGM, SERUM: 112 mg/dL (ref 52–322)

## 2014-10-15 ENCOUNTER — Telehealth: Payer: Self-pay | Admitting: Pulmonary Disease

## 2014-10-15 ENCOUNTER — Telehealth: Payer: Self-pay | Admitting: *Deleted

## 2014-10-15 ENCOUNTER — Ambulatory Visit (HOSPITAL_COMMUNITY)
Admission: RE | Admit: 2014-10-15 | Discharge: 2014-10-15 | Disposition: A | Discharge status: Home / Self Care | Payer: Medicare Other | Source: Ambulatory Visit | Attending: Pulmonary Disease | Admitting: Pulmonary Disease

## 2014-10-15 DIAGNOSIS — K222 Esophageal obstruction: Secondary | ICD-10-CM | POA: Insufficient documentation

## 2014-10-15 DIAGNOSIS — R1310 Dysphagia, unspecified: Secondary | ICD-10-CM | POA: Insufficient documentation

## 2014-10-15 DIAGNOSIS — K219 Gastro-esophageal reflux disease without esophagitis: Secondary | ICD-10-CM

## 2014-10-15 NOTE — Telephone Encounter (Signed)
Spoke with the patient today regarding results of barium swallow. Report readings the barium tablet impacted at the level of the Schatzki's ring. patient reports the barium tablet did ultimately passed but this is undocumented. I have placed a call to radiology and I'm awaiting a return phone call from Dr. Janeece Fitting to confirm that this is the case. Plan refer the patient to GI for further evaluation and treatment.

## 2014-10-15 NOTE — Telephone Encounter (Signed)
GI referral per [facility]

## 2014-10-17 ENCOUNTER — Ambulatory Visit (INDEPENDENT_AMBULATORY_CARE_PROVIDER_SITE_OTHER): Payer: Medicare Other | Admitting: Neurology

## 2014-10-17 ENCOUNTER — Encounter: Payer: Self-pay | Admitting: Neurology

## 2014-10-17 VITALS — BP 130/68 | HR 70 | Resp 22 | Ht 63.0 in | Wt 194.0 lb

## 2014-10-17 DIAGNOSIS — G4733 Obstructive sleep apnea (adult) (pediatric): Secondary | ICD-10-CM

## 2014-10-17 DIAGNOSIS — Z951 Presence of aortocoronary bypass graft: Secondary | ICD-10-CM

## 2014-10-17 DIAGNOSIS — R51 Headache: Secondary | ICD-10-CM

## 2014-10-17 DIAGNOSIS — R351 Nocturia: Secondary | ICD-10-CM

## 2014-10-17 DIAGNOSIS — R519 Headache, unspecified: Secondary | ICD-10-CM

## 2014-10-17 NOTE — Patient Instructions (Signed)

## 2014-10-17 NOTE — Progress Notes (Signed)
Subjective:    Patient ID: [name] is a 74 y.o. female.  HPI     Star Age, MD, PhD [facility] [address] P.O. [place],  [address]  Dear [name],   I saw your patient, [name], upon your kind request in my clinic today for initial consultation of her sleep disturbance, in particular, concern for underlying obstructive sleep apnea. The patient is unaccompanied today. As you know, [name] is a 74 year old right-handed woman with an underlying medical history of hyperlipidemia, CAD, s/p 3 vessel CABG, B12 deficiency, hypertension, type 2 diabetes, neck pain, NASH and obesity, who reports snoring and non-restorative sleep, nocturia of 3-4/night, as well as morning headaches for the past 1+ year. I reviewed your office note from 08/28/2014. She presented with a history of gait disorder, balance problems and fecal incontinence. You suggested a brain MRI without contrast, cervical spine MRI without contrast as well as lumbar spine MRI without contrast. She had these on 09/16/2014:  This is a normal age-appropriate MRI of the brain without contrast. There are no acute findings. Incidental note is made of a "partially empty" sella turcica, unchanged when compared to 2013.  This is an abnormal MRI of the cervical spine showing multilevel degenerative changes as detailed above. The most significant findings are: 1.  Stable left greater than right uncovertebral spurring at C3-C4 causing moderate left foraminal narrowing is no nerve root compression but there is some encroachment upon the exiting left C4 nerve root.   2.  Mild anterolisthesis at C4-C5 associated with mild facet hypertrophy. There is severe right uncovertebral spurring causing moderate foraminal narrowing. There is no definite nerve root compression though there is some encroachment upon the right C5 nerve root. The anterolisthesis has progressed slightly when compared to  2006. 3.  Mild anterolisthesis of C5-C6 associated with mild facet hypertrophy. There does not appear to be nerve root impingement at this level. The anterolisthesis has progressed slightly when compared to 2006. 4.  Disc bulging and uncovertebral spurring associated with moderately severe loss of disc height at C6-C7. There is moderate bilateral foraminal narrowing. Although there is no definite nerve root compression, there is some encroachment upon the exiting C7 nerve roots.  This is an abnormal MRI of the lumbar spine showed the following: 1.   Normal conus medullaris and cauda equina. 2.   Moderate facet hypertrophy at L3-L4 with joint effusions, severe facet hypertrophy at L4-L5 with large joint effusions and moderate facet hypertrophy at L5-S1 with a small right joint effusion.  At L4-L5, there is right greater than left lateral recess stenosis. There does not appear to be any definite nerve root compression though there is some encroachment upon both of the traversing L5 nerve roots. You called the patient with the test results and also suggested further workup through gastroenterology. Of note, she has difficulty falling asleep and staying asleep. This has been going on for years. Also, noteworthy, over 10 years ago she was diagnosed with obstructive sleep apnea and had a sleep study. Prior sleep test results are not available for my review. She was given a CPAP machine and states that she tried it for perhaps a year but it was a struggle. She had problems with mask fitting and tolerance and finally gave up on it. She would be willing to get tested again and try CPAP again. She lives alone. She is widowed. She has 4 grown children. She has a variable sleep schedule. She  has a bedtime of around 11:30 PM and has some difficulty falling asleep. She may take a sleeping pill. She is up after 3 or 4 hours. She also reports significant nocturia of 3-4 times each night. 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 She reads in bed. She really watches TV in bed. She drinks one cup of coffee in the morning and typically no sodas and occasional tea. She is trying to lose weight. After her knee replacement surgery in January of this year she has lost a lot of weight, which was unintended and secondary to severe loss of appetite. She quit smoking 30 years ago. She does not drink alcohol. She denies restless leg symptoms or leg twitching at night. She had a right total shoulder replacement, left rotator cuff surgery, right knee replacement surgery and triple bypass in 2011.    Her Past Medical History Is Significant For: Past Medical History  Diagnosis Date  . Coronary artery disease   . Diabetes mellitus   . Hypertension   . Hyperlipidemia   . Shoulder injury     resolved after shoulder surgery  . Chest pain   . Acute myocardial infarction, unspecified site, episode of care unspecified   . Unspecified asthma(493.90)   . PONV (postoperative nausea and vomiting)   . Anxiety   . GERD (gastroesophageal reflux disease)   . Upper respiratory symptom     02-13-14 "runny nose, mild cough,sniffles" -instructed to inform MD if symptoms worsens    Her Past Surgical History Is Significant For: Past Surgical History  Procedure Laterality Date  . Cardiac catheterization  07/23/2009    EF 60%  . Cardiac catheterization  10/11/2008  . Cardiac catheterization  03/01/2007    EF 75-80%  . Coronary artery bypass graft      SEVERELY DISEASED SAPHENOUS VEIN GRAFT TO THE RIGHT CORONARY ARTERY BUT WITH FAIRLY WELL PRESERVED FLOW TO THE DISTAL RIGHT CORONARY ARTERY FROM THE NATIVE CIRCULATION-RESTART  CATH IN JUNE 2000, REVEALS MILD/MODERATE  CAD WITH GOOD FLOW DOWN HER LAD  . US echocardiography  03/08/2008    EF 55-60%  . Cardiovascular stress test  11/15/2007    EF 60%  . Rotator cuff repair      right and left  . Tumor removed kidney    . Tonsillectomy      age 62  . Abdominal hysterectomy     . Appendectomy      came out with Hysterectomy  . Eye surgery      bilateral cataract surgery with lens implant  . Total knee arthroplasty Right 02/20/2014    Procedure: RIGHT TOTAL KNEE ARTHROPLASTY;  Surgeon: Tobi Bastos, MD;  Location: [facility];  Service: Orthopedics;  Laterality: Right;    Her Family History Is Significant For: Family History  Problem Relation Age of Onset  . Emphysema Paternal Uncle   . Heart disease Maternal Grandfather   . Heart failure Maternal Grandfather   . Esophageal cancer Brother   . Heart attack Father   . Stomach cancer Father   . Neuropathy Neg Hx   . Multiple sclerosis Neg Hx   . Rheum arthritis Sister   . Emphysema Paternal Aunt     Her Social History Is Significant For: Social History   Social History  . Marital Status: Widowed    Spouse Name: N/A  . Number of Children: 4  . Years of Education: Doctorate   Occupational History  . Retired  Social History Main Topics  . Smoking status: Former Smoker -- 0.50 packs/day for 21 years    Types: Cigarettes    Start date: 02/09/1955    Quit date: 02/09/1976  . Smokeless tobacco: Never Used  . Alcohol Use: No  . Drug Use: No  . Sexual Activity: Not Asked   Other Topics Concern  . None   Social History Narrative   Lives alone.   Caffeine use: Drinks 1 cup coffee/day      [place]. Prior travel to [place], [place], [place], [place], [place], [place], [place], & [place]. Previously worked in [employer] She has a dog currently. No bird, mold, or hot tub exposure. She also pastors a church.     Her Allergies Are:  Allergies  Allergen Reactions  . Amlodipine   . Banana Nausea And Vomiting    Stomach pumped  . Co Q10 [Coenzyme Q10]     Body cramps  . Codeine     Hallucinate, loose identity and don't know who I am  . Morphine Other (See Comments)    Can not function, it immobilizes me   . Sulfonamide Derivatives Swelling  :   Her  Current Medications Are:  Outpatient Encounter Prescriptions as of 10/17/2014  Medication Sig  . albuterol (PROVENTIL HFA;VENTOLIN HFA) 108 (90 BASE) MCG/ACT inhaler Inhale 2 puffs into the lungs every 6 (six) hours as needed. For shortness of breath.  Marland Kitchen albuterol (PROVENTIL) (2.5 MG/3ML) 0.083% nebulizer solution Take 3 mLs (2.5 mg total) by nebulization every 6 (six) hours as needed. For shortness of breath.  . ALPRAZolam (XANAX) 0.25 MG tablet Take 0.25 mg by mouth 2 (two) times daily as needed.   . baclofen (LIORESAL) 20 MG tablet Take 1 tablet by mouth 2 (two) times daily.  . beclomethasone (QVAR) 80 MCG/ACT inhaler Inhale 3 puffs into the lungs 2 (two) times daily.  . BELSOMRA 5 MG TABS Take 5 mg by mouth daily.  . Calcium & Magnesium Carbonates (MYLANTA PO) Take 1 tablet by mouth daily as needed. For indigestion.  . canagliflozin (INVOKANA) 300 MG TABS tablet Take 300 mg by mouth daily before breakfast.  . Cholecalciferol (VITAMIN D) 2000 UNITS tablet Take 2,000 Units by mouth daily.   . clopidogrel (PLAVIX) 75 MG tablet Take 75 mg by mouth every morning.  . Cyanocobalamin (VITAMIN B-12 IJ) Inject as directed every 30 (thirty) days.  Marland Kitchen diltiazem (CARDIZEM CD) 240 MG 24 hr capsule Take 1 capsule (240 mg total) by mouth daily.  Marland Kitchen doxazosin (CARDURA) 8 MG tablet Take 1 tablet (8 mg total) by mouth daily.  . famotidine (PEPCID) 20 MG tablet Take 20 mg by mouth 2 (two) times daily.  . ferrous sulfate 325 (65 FE) MG tablet Take 1 tablet (325 mg total) by mouth 3 (three) times daily after meals.  . Fluticasone Furoate-Vilanterol (BREO ELLIPTA) 200-25 MCG/INH AEPB Inhale 1 puff into the lungs daily.  . furosemide (LASIX) 40 MG tablet Take 40 mg by mouth every other day.   Marland Kitchen HYDROmorphone (DILAUDID) 2 MG tablet Take 1 tablet (2 mg total) by mouth every 4 (four) hours as needed for severe pain.  Marland Kitchen insulin lispro protamine-insulin lispro (HUMALOG 75/25) (75-25) 100 UNIT/ML SUSP 15 in am, 15 in evening   . isosorbide mononitrate (IMDUR) 30 MG 24 hr tablet Take 1 tablet (30 mg total) by mouth daily.  . meclizine (ANTIVERT) 25 MG tablet Take 25 mg by mouth as needed  for dizziness (dizziness).   . metoprolol succinate (TOPROL-XL) 50 MG 24 hr tablet Take 1 tablet by mouth daily.  . montelukast (SINGULAIR) 5 MG chewable tablet Chew 1 tablet (5 mg total) by mouth at bedtime.  . Multiple Vitamin (MULTIVITAMIN WITH MINERALS) TABS Take 1 tablet by mouth daily.  . Nebulizers (COMPRESSOR/NEBULIZER) MISC Use with albuterol  . nitroGLYCERIN (NITROSTAT) 0.4 MG SL tablet Place 0.4 mg under the tongue every 5 (five) minutes x 3 doses as needed. For chest pain.  Marland Kitchen omeprazole (PRILOSEC) 20 MG capsule Take 1 capsule (20 mg total) by mouth daily.  . potassium chloride SA (K-DUR,KLOR-CON) 20 MEQ tablet Take 20 mEq by mouth 2 (two) times daily.   No facility-administered encounter medications on file as of 10/17/2014.  :  Review of Systems:  Out of a complete 14 point review of systems, all are reviewed and negative with the exception of these symptoms as listed below:   Review of Systems  HENT: Positive for trouble swallowing.   Respiratory: Positive for cough, shortness of breath and wheezing.   Neurological: Positive for headaches.       Patient has trouble falling and staying asleep, h/o snoring, wakes up short of breath, wakes up feeling tired, denies taking nap, wakes up with headaches every morning.   Psychiatric/Behavioral:       Not enough sleep     Objective:  Neurologic Exam  Physical Exam Physical Examination:   Filed Vitals:   10/17/14 0919  BP: 130/68  Pulse: 70  Resp: 22    General Examination: The patient is a very pleasant 74 y.o. female in no acute distress. She appears well-developed and well-nourished and well groomed.   HEENT: Normocephalic, atraumatic, pupils are equal, round and reactive to light and accommodation. Funduscopic exam is normal with sharp disc margins noted.  Extraocular tracking is good without limitation to gaze excursion or nystagmus noted. Normal smooth pursuit is noted. Hearing is grossly intact. Tympanic membranes are clear bilaterally. Face is symmetric with normal facial animation and normal facial sensation. Speech is clear with no dysarthria noted. There is no hypophonia. There is no lip, neck/head, jaw or voice tremor. Neck is supple with full range of passive and active motion. There are no carotid bruits on auscultation. Oropharynx exam reveals: severe mouth dryness, upper and lower full dentures in place, and moderate airway crowding, due to large tongue, large uvula. Mallampati is class II. Tongue protrudes centrally and palate elevates symmetrically. Tonsils are absent (reports TE at age 64). Neck size is 14 5/8 inches.    Chest: Clear to auscultation without wheezing, rhonchi or crackles noted. She has an unremarkable midline sternotomy scar. She has a couple of bouts of cough.  Heart: S1+S2+0, regular and normal without murmurs, rubs or gallops noted.   Abdomen: Soft, non-tender and non-distended with normal bowel sounds appreciated on auscultation.  Extremities: There is no pitting edema in the distal lower extremities bilaterally. Pedal pulses are intact.  Skin: Warm and dry without trophic changes noted. There are no varicose veins.  Musculoskeletal: exam reveals no obvious joint deformities, tenderness or joint swelling or erythema, with the exception of mild right knee swelling, she has an unremarkable scar from knee replacement surgery.    Neurologically:  Mental status: The patient is awake, alert and oriented in all 4 spheres. Her immediate and remote memory, attention, language skills and fund of knowledge are appropriate. There is no evidence of aphasia, agnosia, apraxia or anomia. Speech is clear with  normal prosody and enunciation. Thought process is linear. Mood is normal and affect is normal.  Cranial nerves II - XII are as  described above under HEENT exam. In addition: shoulder shrug is normal with equal shoulder height noted. Motor exam: Normal bulk, strength and tone is noted. There is no drift, tremor or rebound. Romberg is negative. Reflexes are 2+ throughout, except trace in the R knee. Babinski: Toes are flexor bilaterally. Fine motor skills and coordination: intact with normal finger taps, normal hand movements, normal rapid alternating patting, normal foot taps and normal foot agility.  Cerebellar testing: No dysmetria or intention tremor on finger to nose testing. Heel to shin is unremarkable bilaterally. There is no truncal or gait ataxia.  Sensory exam: intact to light touch, pinprick, vibration, temperature sense in the upper and lower extremities.  Gait, station and balance: She stands with difficulty. No veering to one side is noted. No leaning to one side is noted. Posture is age-appropriate and stance is narrow based. Gait shows normal stride length and normal pace. No problems turning are noted. She has mild trouble with tandem walk.               Assessment and plan:   In summary, CADANCE RAUS is a very pleasant 74 y.o.-year old female with an underlying medical history of hyperlipidemia, CAD, s/p 3 vessel CABG, B12 deficiency, hypertension, type 2 diabetes, neck pain, NASH and obesity, who reports snoring and non-restorative sleep, nocturia of 3-4/night, morning headaches for the past 1+ year as well as a prior Dx of OSA with difficulty tolerating CPAP many years ago. Her history and physical exam are in keeping obstructive sleep apnea (OSA). I had a long chat with the patient about my findings and the diagnosis of OSA, its prognosis and treatment options. We talked about medical treatments, surgical interventions and non-pharmacological approaches. I explained in particular the risks and ramifications of untreated moderate to severe OSA, especially with respect to developing cardiovascular disease down  the Road, including congestive heart failure, difficult to treat hypertension, cardiac arrhythmias, or stroke. Even type 2 diabetes has, in part, been linked to untreated OSA. Symptoms of untreated OSA include daytime sleepiness, memory problems, mood irritability and mood disorder such as depression and anxiety, lack of energy, as well as recurrent headaches, especially morning headaches. We talked about trying to maintain a healthy lifestyle in general, as well as the importance of weight control. I encouraged the patient to eat healthy, exercise daily and keep well hydrated, to keep a scheduled bedtime and wake time routine, to not skip any meals and eat healthy snacks in between meals. I advised the patient not to drive when feeling sleepy. I recommended the following at this time: sleep study with potential positive airway pressure titration. (We will score hypopneas at 4% and split the sleep study into diagnostic and treatment portion, if the estimated. 2 hour AHI is >20/h).   I explained the sleep test procedure to the patient and also outlined possible surgical and non-surgical treatment options of OSA, including the use of a custom-made dental device (which would require a referral to a specialist dentist or oral surgeon), upper airway surgical options, such as pillar implants, radiofrequency surgery, tongue base surgery, and UPPP (which would involve a referral to an ENT surgeon). Rarely, jaw surgery such as mandibular advancement may be considered.  I also explained the CPAP treatment option to the patient, who indicated that she would be willing to try CPAP if  the need arises. I explained the importance of being compliant with PAP treatment, not only for insurance purposes but primarily to improve Her symptoms, and for the patient's long term health benefit, including to reduce Her cardiovascular risks. I answered all her questions today and the patient was in agreement. I would like to see her back  after the sleep study is completed and encouraged her to call with any interim questions, concerns, problems or updates.   Thank you very much for allowing me to participate in the care of this nice patient. If I can be of any further assistance to you please do not hesitate to talk to me.  Sincerely,   Star Age, MD, PhD  I spent 25 minutes in total face-to-face time with the patient, more than 50% of which was spent in counseling and coordination of care, reviewing test results, reviewing medication and discussing or reviewing the diagnosis of OSA, its prognosis and treatment options.

## 2014-10-18 ENCOUNTER — Encounter: Payer: Self-pay | Admitting: Gastroenterology

## 2014-10-31 ENCOUNTER — Ambulatory Visit (INDEPENDENT_AMBULATORY_CARE_PROVIDER_SITE_OTHER): Payer: Medicare Other | Admitting: Neurology

## 2014-10-31 DIAGNOSIS — G4761 Periodic limb movement disorder: Secondary | ICD-10-CM

## 2014-10-31 DIAGNOSIS — G4720 Circadian rhythm sleep disorder, unspecified type: Secondary | ICD-10-CM

## 2014-10-31 DIAGNOSIS — G4733 Obstructive sleep apnea (adult) (pediatric): Secondary | ICD-10-CM | POA: Diagnosis not present

## 2014-10-31 DIAGNOSIS — G4734 Idiopathic sleep related nonobstructive alveolar hypoventilation: Secondary | ICD-10-CM

## 2014-11-01 NOTE — Sleep Study (Signed)
Please see the scanned sleep study interpretation located in the Procedure tab within the Chart Review section. 

## 2014-11-05 ENCOUNTER — Telehealth: Payer: Self-pay | Admitting: Neurology

## 2014-11-05 DIAGNOSIS — G4733 Obstructive sleep apnea (adult) (pediatric): Secondary | ICD-10-CM

## 2014-11-05 DIAGNOSIS — G4734 Idiopathic sleep related nonobstructive alveolar hypoventilation: Secondary | ICD-10-CM

## 2014-11-05 DIAGNOSIS — G4761 Periodic limb movement disorder: Secondary | ICD-10-CM

## 2014-11-05 NOTE — Telephone Encounter (Signed)
Dr. Cathren Laine patient, seen by me on 10/17/14, PSG on 10/31/14, Ins: [employer] medicare, PCP: Dr. Karlton Lemon.   Please call and notify the patient that the recent sleep study did confirm the diagnosis of mild to moderate obstructive sleep apnea and that I recommend treatment for this in the form of CPAP, given her medical and particularly her cardiac history, desaturations into the 80s and high 70s, and her sleep related complaints of AM headaches and nocturia and non-restorative sleep.  It will require a repeat sleep study for proper titration and mask fitting. Please explain to patient and arrange for a CPAP titration study. I have placed an order in the chart. Thanks, and please route to [facility] for scheduling.   Star Age, MD, PhD [facility])

## 2014-11-06 NOTE — Telephone Encounter (Signed)
I spoke to patient and she is aware of results and would like to proceed with second study. I will fax report to PCP.

## 2014-11-06 NOTE — Telephone Encounter (Signed)
Left message for patient to call back  

## 2014-11-20 ENCOUNTER — Ambulatory Visit (INDEPENDENT_AMBULATORY_CARE_PROVIDER_SITE_OTHER): Payer: Medicare Other | Admitting: Pulmonary Disease

## 2014-11-20 ENCOUNTER — Telehealth: Payer: Self-pay | Admitting: Pulmonary Disease

## 2014-11-20 ENCOUNTER — Encounter: Payer: Self-pay | Admitting: Pulmonary Disease

## 2014-11-20 VITALS — BP 142/74 | HR 91 | Ht 63.0 in | Wt 192.0 lb

## 2014-11-20 DIAGNOSIS — J45909 Unspecified asthma, uncomplicated: Secondary | ICD-10-CM

## 2014-11-20 DIAGNOSIS — G4733 Obstructive sleep apnea (adult) (pediatric): Secondary | ICD-10-CM

## 2014-11-20 DIAGNOSIS — R0600 Dyspnea, unspecified: Secondary | ICD-10-CM

## 2014-11-20 DIAGNOSIS — K219 Gastro-esophageal reflux disease without esophagitis: Secondary | ICD-10-CM

## 2014-11-20 DIAGNOSIS — R0609 Other forms of dyspnea: Secondary | ICD-10-CM

## 2014-11-20 DIAGNOSIS — J309 Allergic rhinitis, unspecified: Secondary | ICD-10-CM

## 2014-11-20 LAB — PULMONARY FUNCTION TEST
DL/VA % pred: 98 %
DL/VA: 4.63 ml/min/mmHg/L
DLCO unc % pred: 64 %
DLCO unc: 14.72 ml/min/mmHg
FEF 25-75 Post: 1.18 L/sec
FEF 25-75 Pre: 0.47 L/sec
FEF2575-%CHANGE-POST: 153 %
FEF2575-%PRED-PRE: 33 %
FEF2575-%Pred-Post: 84 %
FEV1-%CHANGE-POST: 32 %
FEV1-%PRED-POST: 76 %
FEV1-%PRED-PRE: 57 %
FEV1-PRE: 0.91 L
FEV1-Post: 1.21 L
FEV1FVC-%CHANGE-POST: 10 %
FEV1FVC-%Pred-Pre: 83 %
FEV6-%CHANGE-POST: 20 %
FEV6-%PRED-PRE: 73 %
FEV6-%Pred-Post: 88 %
FEV6-Post: 1.73 L
FEV6-Pre: 1.44 L
FEV6FVC-%PRED-PRE: 104 %
FEV6FVC-%Pred-Post: 104 %
FVC-%CHANGE-POST: 20 %
FVC-%PRED-PRE: 70 %
FVC-%Pred-Post: 84 %
FVC-POST: 1.73 L
FVC-PRE: 1.44 L
POST FEV1/FVC RATIO: 70 %
POST FEV6/FVC RATIO: 100 %
PRE FEV6/FVC RATIO: 100 %
Pre FEV1/FVC ratio: 63 %
RV % PRED: 103 %
RV: 2.33 L
TLC % PRED: 79 %
TLC: 3.92 L

## 2014-11-20 MED ORDER — BUDESONIDE 0.5 MG/2ML IN SUSP: 0.5000 mg | Freq: Two times a day (BID) | RESPIRATORY_TRACT | Status: DC

## 2014-11-20 MED ORDER — FORMOTEROL FUMARATE 20 MCG/2ML IN NEBU: 20.0000 ug | INHALATION_SOLUTION | Freq: Two times a day (BID) | RESPIRATORY_TRACT | Status: DC

## 2014-11-20 MED ORDER — ALBUTEROL SULFATE (2.5 MG/3ML) 0.083% IN NEBU: 2.5000 mg | INHALATION_SOLUTION | Freq: Four times a day (QID) | RESPIRATORY_TRACT | Status: DC | PRN

## 2014-11-20 NOTE — Telephone Encounter (Signed)
Nothing further is needed with this message  Message will be closed

## 2014-11-20 NOTE — Patient Instructions (Signed)
1. Continue to use your albuterol inhaler nebulizer every 4 hours as needed to help with any coughing, wheezing, or shortness of breath. 2. Continue using Breo 1 inhalation once daily. If you need refills please call my office as I do not want she did run out of medication. Once we establish you on your new nebulizer regimen of Perforomist & budesonide/Pulmicort you can stop taking Breo. 3. Continue taking Singulair/Montelukast as prescribed. 4. We are sending in prescriptions for Perforomist & budesonide/Pulmicort to take in your nebulizer twice daily. This will replace Breo & Qvar. Remember to rinse her mouth after use the budesonide/Pulmicort and do not combine the 2 medications to take simultaneously. These will hopefully be more affordable than your other inhalers. 5. I will repeat breathing tests such her next appointment. 6. Please contact my office if you have any further questions or concerns otherwise I will see back in 3 months or sooner if needed.

## 2014-11-20 NOTE — Progress Notes (Signed)
PFT done today. 

## 2014-11-20 NOTE — Progress Notes (Signed)
Subjective:    Patient ID: [name], female    DOB: 1941-02-02, 74 y.o.   MRN: [id number]  C.C.:  Follow-up for Asthma w/ Severe Fixed Airways Obstruction, Nocturnal Hypoxia, & GERD.  HPI Since her last appointment she had a "respiratory illness" but reports that she is feeling better. She was treated with antibiotics for a cough as well IM steroids as well.   Asthma w/ Severe Airways Obstruction :  Patient has a history of recurrent bronchitis. She uses Breo daily as well as Qvar bid. Previously was on Advair. Started on Singulair daily at bedtime at last appointment. She does feel like her breathing is better. She reports she had some mild wheezing yesterday. Continues to take Qvar & Breo as prescribed. She reports previously she was using her rescue inhaler 2-3 times daily but recently she has been using it twice daily. She does feel like her breathing has progressively been worsening. She is having trouble affording her medications. She does go periods where she cannot take her inhalers on a daily basis.   Nocturnal Hypoxia:  Previously was prescribed oxygen at 5 L/m while sleeping. Polysomnogram confirms mild-moderate OSA. She is subsequently referred for a CPAP titration and is being followed by Dr. Rexene Alberts.   Allergic Rhinitis: Started on Singulair 5 mg by mouth daily at bedtime last appointment along with nasal saline rinses twice daily. She reports some mild improvement in her sinus congestion & drainage. She still is having some sinus headaches.   GERD:   Patient was found to have Schatzki's ring on barium swallow. Patient referred to GI for further workup. She has an appointment on 10/24 for evaluation. She denies any dyspepsia. She does have reflux depending on her diet. She does have dysphagia, especially with medications, but no odynophagia.   Review of Systems No recent fever, chills, or sweats. No chest pain or pressure.   Allergies  Allergen Reactions  . Amlodipine   .  Banana Nausea And Vomiting    Stomach pumped  . Co Q10 [Coenzyme Q10]     Body cramps  . Codeine     Hallucinate, loose identity and don't know who I am  . Morphine Other (See Comments)    Can not function, it immobilizes me   . Sulfonamide Derivatives Swelling   Current Outpatient Prescriptions on File Prior to Visit  Medication Sig Dispense Refill  . albuterol (PROVENTIL HFA;VENTOLIN HFA) 108 (90 BASE) MCG/ACT inhaler Inhale 2 puffs into the lungs every 6 (six) hours as needed. For shortness of breath. 1 Inhaler 0  . albuterol (PROVENTIL) (2.5 MG/3ML) 0.083% nebulizer solution Take 3 mLs (2.5 mg total) by nebulization every 6 (six) hours as needed. For shortness of breath. 75 mL 2  . ALPRAZolam (XANAX) 0.25 MG tablet Take 0.25 mg by mouth 2 (two) times daily as needed.     . baclofen (LIORESAL) 20 MG tablet Take 1 tablet by mouth 2 (two) times daily.    . beclomethasone (QVAR) 80 MCG/ACT inhaler Inhale 3 puffs into the lungs 2 (two) times daily. 2 Inhaler 0  . BELSOMRA 5 MG TABS Take 5 mg by mouth daily.  2  . Calcium & Magnesium Carbonates (MYLANTA PO) Take 1 tablet by mouth daily as needed. For indigestion.    . canagliflozin (INVOKANA) 300 MG TABS tablet Take 300 mg by mouth daily before breakfast.    . Cholecalciferol (VITAMIN D) 2000 UNITS tablet Take 2,000 Units by mouth daily.     Marland Kitchen  clopidogrel (PLAVIX) 75 MG tablet Take 75 mg by mouth every morning.    . Cyanocobalamin (VITAMIN B-12 IJ) Inject as directed every 30 (thirty) days.    Marland Kitchen diltiazem (CARDIZEM CD) 240 MG 24 hr capsule Take 1 capsule (240 mg total) by mouth daily. 30 capsule 10  . doxazosin (CARDURA) 8 MG tablet Take 1 tablet (8 mg total) by mouth daily. 30 tablet 7  . famotidine (PEPCID) 20 MG tablet Take 20 mg by mouth 2 (two) times daily.    . ferrous sulfate 325 (65 FE) MG tablet Take 1 tablet (325 mg total) by mouth 3 (three) times daily after meals. 30 tablet 0  . Fluticasone Furoate-Vilanterol (BREO ELLIPTA)  200-25 MCG/INH AEPB Inhale 1 puff into the lungs daily. 2 each 0  . furosemide (LASIX) 40 MG tablet Take 40 mg by mouth every other day.     Marland Kitchen HYDROmorphone (DILAUDID) 2 MG tablet Take 1 tablet (2 mg total) by mouth every 4 (four) hours as needed for severe pain. 80 tablet 0  . insulin lispro protamine-insulin lispro (HUMALOG 75/25) (75-25) 100 UNIT/ML SUSP 15 in am, 15 in evening    . isosorbide mononitrate (IMDUR) 30 MG 24 hr tablet Take 1 tablet (30 mg total) by mouth daily. 31 tablet 11  . meclizine (ANTIVERT) 25 MG tablet Take 25 mg by mouth as needed for dizziness (dizziness).     . metoprolol succinate (TOPROL-XL) 50 MG 24 hr tablet Take 1 tablet by mouth daily.    . montelukast (SINGULAIR) 5 MG chewable tablet Chew 1 tablet (5 mg total) by mouth at bedtime. 30 tablet 3  . Multiple Vitamin (MULTIVITAMIN WITH MINERALS) TABS Take 1 tablet by mouth daily.    . Nebulizers (COMPRESSOR/NEBULIZER) MISC Use with albuterol 1 each 0  . nitroGLYCERIN (NITROSTAT) 0.4 MG SL tablet Place 0.4 mg under the tongue every 5 (five) minutes x 3 doses as needed. For chest pain.    Marland Kitchen omeprazole (PRILOSEC) 20 MG capsule Take 1 capsule (20 mg total) by mouth daily. 30 capsule 6  . potassium chloride SA (K-DUR,KLOR-CON) 20 MEQ tablet Take 20 mEq by mouth 2 (two) times daily.     No current facility-administered medications on file prior to visit.   Past Medical History  Diagnosis Date  . Coronary artery disease   . Diabetes mellitus   . Hypertension   . Hyperlipidemia   . Shoulder injury     resolved after shoulder surgery  . Chest pain   . Acute myocardial infarction, unspecified site, episode of care unspecified   . Unspecified asthma(493.90)   . PONV (postoperative nausea and vomiting)   . Anxiety   . GERD (gastroesophageal reflux disease)   . Upper respiratory symptom     02-13-14 "runny nose, mild cough,sniffles" -instructed to inform MD if symptoms worsens   Past Surgical History  Procedure  Laterality Date  . Cardiac catheterization  07/23/2009    EF 60%  . Cardiac catheterization  10/11/2008  . Cardiac catheterization  03/01/2007    EF 75-80%  . Coronary artery bypass graft      SEVERELY DISEASED SAPHENOUS VEIN GRAFT TO THE RIGHT CORONARY ARTERY BUT WITH FAIRLY WELL PRESERVED FLOW TO THE DISTAL RIGHT CORONARY ARTERY FROM THE NATIVE CIRCULATION-RESTART  CATH IN JUNE 2000, REVEALS MILD/MODERATE  CAD WITH GOOD FLOW DOWN HER LAD  . US echocardiography  03/08/2008    EF 55-60%  . Cardiovascular stress test  11/15/2007    EF 60%  .  Rotator cuff repair      right and left  . Tumor removed kidney    . Tonsillectomy      age 68  . Abdominal hysterectomy    . Appendectomy      came out with Hysterectomy  . Eye surgery      bilateral cataract surgery with lens implant  . Total knee arthroplasty Right 02/20/2014    Procedure: RIGHT TOTAL KNEE ARTHROPLASTY;  Surgeon: Tobi Bastos, MD;  Location: [facility];  Service: Orthopedics;  Laterality: Right;   Family History  Problem Relation Age of Onset  . Emphysema Paternal Uncle   . Heart disease Maternal Grandfather   . Heart failure Maternal Grandfather   . Esophageal cancer Brother   . Heart attack Father   . Stomach cancer Father   . Neuropathy Neg Hx   . Multiple sclerosis Neg Hx   . Rheum arthritis Sister   . Emphysema Paternal Aunt    Social History   Social History  . Marital Status: Widowed    Spouse Name: N/A  . Number of Children: 4  . Years of Education: Doctorate   Occupational History  . Retired    Social History Main Topics  . Smoking status: Former Smoker -- 0.50 packs/day for 21 years    Types: Cigarettes    Start date: 02/09/1955    Quit date: 02/09/1976  . Smokeless tobacco: Never Used  . Alcohol Use: No  . Drug Use: No  . Sexual Activity: Not Asked   Other Topics Concern  . None   Social History Narrative   Lives alone.   Caffeine use: Drinks 1 cup coffee/day      Originally from [place].  Previously has lived in [place]. Prior travel to [place], [place], [place], [place], [place], [place], [place], & [place]. Previously worked in [employer] She has a dog currently. No bird, mold, or hot tub exposure. She also pastors a church.       Objective:   Physical Exam Blood pressure 142/74, pulse 91, height 5\' 3"  (1.6 m), weight 192 lb (87.091 kg), SpO2 95 %. General:  Mildly obese female. No distress. Awake. Integument:  Warm & dry. No bruising or rash on exposed skin. HEENT:  Moist mucus membranes. Mild bilateral nasal turbinate swelling. No scleral injection. Cardiovascular:  Regular rate. No edema. No appreciable JVD.  Pulmonary:  Clear to auscultation bilaterally. Normal work of breathing on room air. Abdomen: Soft. Normal bowel sounds. Protuberant. Nontender. Musculoskeletal:  Normal bulk and tone. Hand grip strength 5/5 bilaterally. No joint deformity or effusion appreciated.  PFT 11/20/14: FVC 1.44 L (70%) FEV1 0.91 L (57%) FEV1/FVC 0.63 FEF 25-75 0.47 L (33%) positive bronchodilator response TLC 3.92 L (79%) RV 103% DLCO uncorrected 64% 11/12/13: FVC 1.57 L (74%) FEV1 1.09 L (67%) FEV1/FVC 0.70 FEF 25-75 0.72 L (50%) negative bronchodilator response 08/04/11: FVC 1.61 L (60%) FEV1 0.89 L (48%) FEV1/FVC 0.56 FEF 25-75 0.29 L (14%) positive bronchodilator response TLC 3.67 L (80%) RV 103% ERV 46% DLCO corrected 38%  6MWT 11/20/14:  Walked 336 meters / Baseline Sat 100% on RA / Nadir Sat 99% on RA (pt c/o pain in her right neck w/ dyspnea)  IMAGING BARIUM SWALLOW (10/15/14): Schatzki ring distal esophagus maximum diameter 1.2 cm with barium tablet impacting at this ring.  CXR PA/LAT 02/13/14 (previously reviewed by me):  Sternotomy wires from prior thoracic surgery noted. Heart normal in size. No pleural effusion appreciated. No parenchymal nodule or  opacification appreciated. Mild flattening of the diaphragms bilaterally.  CARDIAC TTE (10/06/11): LV normal in size. Normal regional  wall motion. EF 60-65%. Grade 1 diastolic dysfunction. LA & RA normal in size. 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 RV normal in size and function. RVSP 19 mmHg. No aortic stenosis or regurgitation. No mitral stenosis or regurgitation. No pulmonic stenosis. Trivial tricuspid regurgitation. No pericardial effusion.  LABS 10/08/14 IgG: 1060 IgM: 112 IgA: 272 IgE: 5 CBC: 6.1/11.3/36.0/293 Differential: Eos 0.3 (4.5%)  RAST panel: Negative Aspergillus antigen: <0.1  1/26/9 ABG:  7.39/44/113    Assessment & Plan:  74 year old female previously diagnosed with extrinsic asthma and also with known allergic rhinitis & GERD. Patient has newly discovered OSA based on polysomnogram and has been referred for CPAP titration. Her spirometry has significant really worsened since previous testing with a continued significant bronchodilator response. I suspect the mild restriction seen on her lung volumes is likely due to her cardiac history as well as her reduction in diffusion capacity. Symptomatically I am concerned that the neck pain she experienced today is potentially an anginal equivalent and have sent a message to Dr. Acie Fredrickson to make him aware of this. I am attempting to switch the patient over to a nebulizer regimen to improve drug delivery and affordability of medication. Her allergic rhinitis seems to be better controlled on oral Singulair. She remained asymptomatic with regards to reflux but I suspect the dysphagia is coming from her Schatzki's ring which is noted on barium swallow. I instructed the patient contact my office if she had any new breathing problems before next appointment as we would be happy see her back sooner.  1. Asthma with severe fixed airways obstruction: Switching the patient from Breo & Qvar to Pulmicort & Perforomist nebulized twice daily. Plan to repeat spirometry with bronchodilator challenge next appointment to ensure maximal bronchodilatation. Patient will continue on  albuterol via her nebulizer as needed. 2. GERD with Schatzki's ring: Referred to GI for further evaluation and treatment recommendations. 3. Allergic rhinitis: Continuing nasal saline rinses & Singulair as prescribed. 4. OSA: Referred for CPAP titration. Follows with neurology. 5. Follow-up: Patient to return to clinic in 3 months or sooner if needed.

## 2014-11-21 NOTE — Progress Notes (Signed)
Testing reviewed & in progress note.

## 2014-12-02 ENCOUNTER — Telehealth: Payer: Self-pay

## 2014-12-02 ENCOUNTER — Encounter: Payer: Self-pay | Admitting: Gastroenterology

## 2014-12-02 ENCOUNTER — Ambulatory Visit (INDEPENDENT_AMBULATORY_CARE_PROVIDER_SITE_OTHER): Payer: Medicare Other | Admitting: Gastroenterology

## 2014-12-02 VITALS — BP 118/64 | HR 86 | Ht 63.0 in | Wt 191.8 lb

## 2014-12-02 DIAGNOSIS — D509 Iron deficiency anemia, unspecified: Secondary | ICD-10-CM

## 2014-12-02 DIAGNOSIS — R1314 Dysphagia, pharyngoesophageal phase: Secondary | ICD-10-CM | POA: Diagnosis not present

## 2014-12-02 DIAGNOSIS — I2510 Atherosclerotic heart disease of native coronary artery without angina pectoris: Secondary | ICD-10-CM | POA: Diagnosis not present

## 2014-12-02 DIAGNOSIS — Z86010 Personal history of colonic polyps: Secondary | ICD-10-CM

## 2014-12-02 DIAGNOSIS — R933 Abnormal findings on diagnostic imaging of other parts of digestive tract: Secondary | ICD-10-CM | POA: Diagnosis not present

## 2014-12-02 NOTE — Patient Instructions (Addendum)
You have been scheduled for an endoscopy. Please follow written instructions given to you at your visit today. If you use inhalers (even only as needed), please bring them with you on the day of your procedure.  You will be contacted by our office prior to your procedure for directions on holding your Plavix.  If you do not hear from our office 1 week prior to your scheduled procedure, please call [phone] to discuss.   Thank you for choosing me and [facility].  Pricilla Riffle. Dagoberto Ligas., MD., Marval Regal  cc: Tera Partridge, MD

## 2014-12-02 NOTE — Telephone Encounter (Signed)
Ms. Formby may hold her plavix for 7 days prior to endoscopic procedure

## 2014-12-02 NOTE — Telephone Encounter (Signed)
  12/02/2014   RE: [name] DOB: [birth year] MRN: [id number]   Dear Dr. Acie Fredrickson,    We have scheduled the above patient for an endoscopic procedure. Our records show that she is on anticoagulation therapy.   Please advise as to how long the patient may come off her therapy of Plavix prior to the procedure, which is scheduled for 01/08/15.  Please route back to Marlon Pel, [place].

## 2014-12-02 NOTE — Progress Notes (Addendum)
History of Present Illness: This is a 74 year old female referred by Javier Glazier, MD for the evaluation of dysphagia and abnormal barium esophagram. She has been followed for GI by Dr. Clarene Essex for many years with her last visit with him in about 2014. She states that she has had 2 or 3 colonoscopies over the years and 1 endoscopy. I was able to find records in Epic from her procedures in 2002 and 2003. EGD performed in March 2002 showed a submucosal antral lesion, mild gastritis and small hiatal hernia. Colonoscopy performed in January 2003 showed small adenomatous colon polyps and internal and external hemorrhoids. Patient feels she had a follow-up colonoscopy done in 2014. She notes worsening solid food dysphagia and dysphasia with pills over the past few months. She notes occasionally she chokes with water as well. Barium esophagram revealed a 12 mm esophageal stricture and a barium tablet was temporarily impacted. She has chronic GERD and her symptoms are under fair to good control on omeprazole every morning and Pepcid every evening however she has frequent breakthrough symptoms. Denies weight loss, abdominal pain, constipation, diarrhea, change in stool caliber, melena, hematochezia, nausea, vomiting, chest pain.  Review of Systems: Pertinent positive and negative review of systems were noted in the above HPI section. All other review of systems were otherwise negative.  Current Medications, Allergies, Past Medical History, Past Surgical History, Family History and Social History were reviewed in [facility] record.  Physical Exam: General: Well developed, well nourished, no acute distress Head: Normocephalic and atraumatic Eyes:  sclerae anicteric, EOMI Ears: Normal auditory acuity Mouth: No deformity or lesions Neck: Supple, no masses or thyromegaly Lungs: Clear throughout to auscultation Heart: Regular rate and rhythm; no murmurs, rubs or bruits Abdomen:  Soft, non tender and non distended. No masses, hepatosplenomegaly or hernias noted. Normal Bowel sounds Musculoskeletal: Symmetrical with no gross deformities  Skin: No lesions on visible extremities Pulses:  Normal pulses noted Extremities: No clubbing, cyanosis, edema or deformities noted Neurological: Alert oriented x 4, grossly nonfocal Cervical Nodes:  No significant cervical adenopathy Inguinal Nodes: No significant inguinal adenopathy Psychological:  Alert and cooperative. Normal mood and affect  Assessment and Recommendations:  1. Dysphagia. Esophageal stricture on barium esophagram. GERD. Continue omeprazole 20 mg every morning and Pepcid 20 mg every evening. Follow all standard antireflux measures. Schedule EGD with dilation. The risks (including bleeding, perforation, infection, missed lesions, medication reactions and possible hospitalization or surgery if complications occur), benefits, and alternatives to endoscopy with possible biopsy and possible dilation were discussed with the patient and they consent to proceed.   2. Hold Plavix 5 days before procedure - will instruct when and how to resume after procedure. Low but real risk of cardiovascular event such as heart attack, stroke, embolism, thrombosis or ischemia/infarct of other organs off Plavix explained and need to seek urgent help if this occurs. The patient consents to proceed. Will communicate by phone or EMR with patient's prescribing provider to confirm that holding Plavix is reasonable in this case.   3. Microcytic anemia. Rule out iron deficiency. EGD as above. Request records from all colonoscopies after 2003 by Dr. Watt Climes.  4. Personal history of adenomatous colon polyps. As above request records from all colonoscopies after 2003 by Dr. Watt Climes.   cc: Javier Glazier, MD [address], [place] [phone]    ADDENDUM 12/20/2014. Records from Dr. Watt Climes received and reviewed.  Colonoscopy in July 2013  showed internal and external hemorrhoids, diverticulosis and a small hyperplastic polyp. Colonoscopy in June 2008 showed same findings as in 2013. No plans for future screening or surveillance colonoscopies due to age.

## 2014-12-03 NOTE — Telephone Encounter (Signed)
Left a message for patient to return my call. 

## 2014-12-04 NOTE — Telephone Encounter (Signed)
Left a message for patient to return my call. 

## 2014-12-05 NOTE — Telephone Encounter (Signed)
Informed patient of Dr. Elmarie Shiley recommendations to hold Plavix prior to her procedure. Pt verbalized understanding.

## 2014-12-16 ENCOUNTER — Telehealth: Payer: Self-pay | Admitting: Neurology

## 2014-12-16 ENCOUNTER — Other Ambulatory Visit: Payer: Self-pay

## 2014-12-16 NOTE — Telephone Encounter (Signed)
Patient called and states she was told she would have a CPAP sleep study.  There is not an order for a CPAP in the system.  Please advise.

## 2014-12-16 NOTE — Telephone Encounter (Signed)
Order in chart already.

## 2014-12-16 NOTE — Telephone Encounter (Signed)
Patient's request for titration study sent to sleep lab 9/28. Can you place order, or tell me what you want and I can submit.

## 2014-12-24 ENCOUNTER — Ambulatory Visit (INDEPENDENT_AMBULATORY_CARE_PROVIDER_SITE_OTHER): Payer: Medicare Other | Admitting: Cardiovascular Disease

## 2014-12-24 ENCOUNTER — Encounter: Payer: Self-pay | Admitting: Cardiovascular Disease

## 2014-12-24 VITALS — BP 112/72 | HR 84 | Ht 63.0 in | Wt 189.8 lb

## 2014-12-24 DIAGNOSIS — R0789 Other chest pain: Secondary | ICD-10-CM

## 2014-12-24 DIAGNOSIS — I5032 Chronic diastolic (congestive) heart failure: Secondary | ICD-10-CM | POA: Diagnosis not present

## 2014-12-24 DIAGNOSIS — I2510 Atherosclerotic heart disease of native coronary artery without angina pectoris: Secondary | ICD-10-CM

## 2014-12-24 NOTE — Progress Notes (Signed)
Cardiology Office Note   Date:  12/24/2014   ID:  [name], DOB 06-22-1940, MRN [id number]  PCP:  Salena Saner., MD  Cardiologist:   Thayer Headings, MD   Chief Complaint  Patient presents with  . Follow-up   1. Coronary artery disease-status post CABG, the saphenous vein graft to the right coronary artery is severely diseased but she has good flow to the distal right coronary artery from the native circulation. 2. Diabetes mellitus 3. Hypertension 4. Hyperlipidemia 5. COPD: June, 2013 - FEV1 equals 0.89 L which is 48% of predicted value. Her DLCO is also markedly depressed. 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   History of Present Illness:  [name] is a 74 y.o. female with the above noted hx. She continues to have episodes of sharp pain ( few seconds) followed by a burning pain ( last 6-7 minutes). She usually does not take NTG because it causes a headache. She was started on Lasix earlier this year. She seems to be doing much better on Lasix. She's had only function test which revealed severe obstructive lung disease with a good initial response to bronchodilators.  She is scheduled to have surgery all right knee. She's here today for preoperative evaluation prior to having the surgery.  September 04, 2012:  [name] is seen today for a follow up visit. She saw Richardson Dopp in April and was started on bystolic. She has tolerated that fairly well. She has some episodes of CP that are constant. She is very stable.   03/01/2013:  She continues to have significant CP. Still short of breath. She has no energy. Just does not feel well in general.  She does not get much sleep - perhaps 3 hours a night.   Jan. 8, 2016:  [name] is a 74 yo who we follow for CAD, DM, HTN, hyperlipidemia, . She has COPD: FEV1 equals 0.89 L which is 48% of predicted value. Her DLCO is also markedly depressed. She has severe  obstructive lung disease with a beneficial response to bronchodilators.  Breathing is the same. Has some upper respiratory issues.  Still has some tightness.    Jun 17, 2014:    [name] is a 74 y.o. female who presents for episodes of CP    Started 1 1/2 months ago.  Has tried mylanta - thought it was gas.  Did not get any better Occurs wjith exertion or at rest. , left sided chest pain   No radiation Gets lightheaded. Has tried NTG which seems to help.    Nov. 15 2016:  Has not been feeling well for the past couple of months Has a chest pressure when she gets out of bed.  Occurs twice a day - in the AM when she gets up  And then again at 3:30 in the afternoon.   Is active between those times , makes home visit.   Does water aerobics without any CP / pressure.   Wears home O2 at night.     Past Medical History  Diagnosis Date  . Coronary artery disease   . Diabetes mellitus   . Hypertension   . Hyperlipidemia   . Shoulder injury     resolved after shoulder surgery  . Chest pain   . Acute myocardial infarction, unspecified site, episode of care unspecified   . Unspecified asthma(493.90)   . PONV (postoperative nausea and vomiting)   . Anxiety   .  GERD (gastroesophageal reflux disease)   . Upper respiratory symptom     02-13-14 "runny nose, mild cough,sniffles" -instructed to inform MD if symptoms worsens  . Schatzki's ring   . Hiatal hernia   . Adenomatous colon polyp     Past Surgical History  Procedure Laterality Date  . Cardiac catheterization  07/23/2009    EF 60%  . Cardiac catheterization  10/11/2008  . Cardiac catheterization  03/01/2007    EF 75-80%  . Coronary artery bypass graft      SEVERELY DISEASED SAPHENOUS VEIN GRAFT TO THE RIGHT CORONARY ARTERY BUT WITH FAIRLY WELL PRESERVED FLOW TO THE DISTAL RIGHT CORONARY ARTERY FROM THE NATIVE CIRCULATION-RESTART  CATH IN JUNE 2000, REVEALS MILD/MODERATE  CAD WITH GOOD FLOW DOWN HER LAD  . US echocardiography   03/08/2008    EF 55-60%  . Cardiovascular stress test  11/15/2007    EF 60%  . Rotator cuff repair      right and left  . Tumor removed kidney    . Tonsillectomy      age 59  . Abdominal hysterectomy    . Appendectomy      came out with Hysterectomy  . Eye surgery      bilateral cataract surgery with lens implant  . Total knee arthroplasty Right 02/20/2014    Procedure: RIGHT TOTAL KNEE ARTHROPLASTY;  Surgeon: Tobi Bastos, MD;  Location: [facility];  Service: Orthopedics;  Laterality: Right;  . Colonoscopy    . Esophagogastroduodenoscopy       Current Outpatient Prescriptions  Medication Sig Dispense Refill  . albuterol (PROVENTIL HFA;VENTOLIN HFA) 108 (90 BASE) MCG/ACT inhaler Inhale 2 puffs into the lungs every 6 (six) hours as needed. For shortness of breath. 1 Inhaler 0  . albuterol (PROVENTIL) (2.5 MG/3ML) 0.083% nebulizer solution Take 3 mLs (2.5 mg total) by nebulization every 6 (six) hours as needed. For shortness of breath. 360 mL 11  . ALPRAZolam (XANAX) 0.25 MG tablet Take 0.25 mg by mouth 2 (two) times daily as needed.     . baclofen (LIORESAL) 20 MG tablet Take 1 tablet by mouth 2 (two) times daily.    . beclomethasone (QVAR) 80 MCG/ACT inhaler Inhale 3 puffs into the lungs 2 (two) times daily. 2 Inhaler 0  . BELSOMRA 5 MG TABS Take 5 mg by mouth daily.  2  . budesonide (PULMICORT) 0.5 MG/2ML nebulizer solution Take 2 mLs (0.5 mg total) by nebulization 2 (two) times daily. 120 mL 11  . Calcium & Magnesium Carbonates (MYLANTA PO) Take 1 tablet by mouth daily as needed. For indigestion.    . canagliflozin (INVOKANA) 300 MG TABS tablet Take 300 mg by mouth daily before breakfast.    . Cholecalciferol (VITAMIN D) 2000 UNITS tablet Take 2,000 Units by mouth daily.     . clopidogrel (PLAVIX) 75 MG tablet Take 75 mg by mouth every morning.    . Cyanocobalamin (VITAMIN B-12 IJ) Inject as directed every 30 (thirty) days.    Marland Kitchen diltiazem (CARDIZEM CD) 240 MG 24 hr capsule Take 1  capsule (240 mg total) by mouth daily. 30 capsule 10  . doxazosin (CARDURA) 8 MG tablet Take 1 tablet (8 mg total) by mouth daily. 30 tablet 7  . famotidine (PEPCID) 20 MG tablet Take 20 mg by mouth 2 (two) times daily.    . ferrous sulfate 325 (65 FE) MG tablet Take 1 tablet (325 mg total) by mouth 3 (three) times daily after meals. 30 tablet 0  .  Fluticasone Furoate-Vilanterol (BREO ELLIPTA) 200-25 MCG/INH AEPB Inhale 1 puff into the lungs daily. 2 each 0  . formoterol (PERFOROMIST) 20 MCG/2ML nebulizer solution Take 2 mLs (20 mcg total) by nebulization 2 (two) times daily. 120 mL 11  . furosemide (LASIX) 40 MG tablet Take 40 mg by mouth every other day.     Marland Kitchen HYDROmorphone (DILAUDID) 2 MG tablet Take 1 tablet (2 mg total) by mouth every 4 (four) hours as needed for severe pain. 80 tablet 0  . insulin lispro protamine-insulin lispro (HUMALOG 75/25) (75-25) 100 UNIT/ML SUSP 15 in am, 15 in evening    . isosorbide mononitrate (IMDUR) 30 MG 24 hr tablet Take 1 tablet (30 mg total) by mouth daily. 31 tablet 11  . meclizine (ANTIVERT) 25 MG tablet Take 25 mg by mouth as needed for dizziness (dizziness).     . metoprolol succinate (TOPROL-XL) 50 MG 24 hr tablet Take 1 tablet by mouth daily.    . montelukast (SINGULAIR) 5 MG chewable tablet Chew 1 tablet (5 mg total) by mouth at bedtime. 30 tablet 3  . Multiple Vitamin (MULTIVITAMIN WITH MINERALS) TABS Take 1 tablet by mouth daily.    . Nebulizers (COMPRESSOR/NEBULIZER) MISC Use with albuterol 1 each 0  . nitroGLYCERIN (NITROSTAT) 0.4 MG SL tablet Place 0.4 mg under the tongue every 5 (five) minutes x 3 doses as needed. For chest pain.    Marland Kitchen omeprazole (PRILOSEC) 20 MG capsule Take 1 capsule (20 mg total) by mouth daily. 30 capsule 6  . potassium chloride SA (K-DUR,KLOR-CON) 20 MEQ tablet Take 20 mEq by mouth 2 (two) times daily.     No current facility-administered medications for this visit.    Allergies:   Amlodipine; Banana; Co q10; Codeine;  Morphine; and Sulfonamide derivatives    Social History:  The patient  reports that she quit smoking about 38 years ago. Her smoking use included Cigarettes. She started smoking about 59 years ago. She has a 10.5 pack-year smoking history. She has never used smokeless tobacco. She reports that she does not drink alcohol or use illicit drugs.   Family History:  The patient's family history includes Emphysema in her paternal aunt and paternal uncle; Esophageal cancer (age of onset: 39) in her brother; Heart attack in her father; Heart disease in her maternal grandfather; Heart failure in her maternal grandfather; Rheum arthritis in her sister; Stomach cancer in her father. There is no history of Neuropathy, Multiple sclerosis, or Colon cancer.    ROS:  Please see the history of present illness.    Review of Systems: Constitutional:  denies fever, chills, diaphoresis, appetite change and fatigue.  HEENT: denies photophobia, eye pain, redness, hearing loss, ear pain, congestion, sore throat, rhinorrhea, sneezing, neck pain, neck stiffness and tinnitus.  Respiratory: denies SOB, DOE, cough, chest tightness, and wheezing.  Cardiovascular: denies chest pain, palpitations and mild  leg swelling.  Gastrointestinal: denies nausea, vomiting, abdominal pain, diarrhea, constipation, blood in stool.  Genitourinary: denies dysuria, urgency, frequency, hematuria, flank pain and difficulty urinating.  Musculoskeletal: denies  myalgias, back pain, joint swelling, arthralgias and gait problem.   Skin: denies pallor, rash and wound.  Neurological: denies dizziness, seizures, syncope, weakness, light-headedness, numbness and headaches.   Hematological: denies adenopathy, easy bruising, personal or family bleeding history.  Psychiatric/ Behavioral: denies suicidal ideation, mood changes, confusion, nervousness, sleep disturbance and agitation.       All other systems are reviewed and negative.    PHYSICAL  EXAM: VS:  BP  112/72 mmHg  Pulse 84  Ht 5\' 3"  (1.6 m)  Wt 189 lb 12.8 oz (86.093 kg)  BMI 33.63 kg/m2 , BMI Body mass index is 33.63 kg/(m^2). GEN: Well nourished, well developed, in no acute distress HEENT: normal Neck: no JVD, carotid bruits, or masses Cardiac: RRR; no murmurs, rubs, or gallops,no edema  Respiratory:  clear to auscultation bilaterally, normal work of breathing GI: soft, nontender, nondistended, + BS MS: no deformity or atrophy Skin: warm and dry, no rash Neuro:  Strength and sensation are intact Psych: normal   EKG:  EKG is ordered today. The ekg ordered today demonstrates  NSR at 83.  RBBB.    Recent Labs: 02/13/2014: ALT 14 02/22/2014: BUN 26*; Creatinine, Ser 0.81; Potassium 4.8; Sodium 137 10/08/2014: Hemoglobin 11.3*; Platelets 293.0    Lipid Panel    Component Value Date/Time   CHOL 161 10/06/2011 0500   TRIG 109 10/06/2011 0500   HDL 37* 10/06/2011 0500   CHOLHDL 4.4 10/06/2011 0500   VLDL 22 10/06/2011 0500   LDLCALC 102* 10/06/2011 0500      Wt Readings from Last 3 Encounters:  12/24/14 189 lb 12.8 oz (86.093 kg)  12/02/14 191 lb 12.8 oz (87 kg)  11/20/14 192 lb (87.091 kg)      Other studies Reviewed: Additional studies/ records that were reviewed today include: . Review of the above records demonstrates:    ASSESSMENT AND PLAN:  1. Coronary artery disease-status post CABG, the saphenous vein graft to the right coronary artery is severely diseased but she has good flow to the distal right coronary artery from the native circulation. She presents with recurrent CP - different from her previous CP .   We performed a Myoview study in May of this year and February of this year. No evidence of ischemia. I doubt that her symptoms are due to ischemic heart disease. We will get an echocardiogram for further evaluation of this chest discomfort and her shortness breath. She may have developed some pulmonary hypertension. She does have a history  of COPD and reduced diffusion capacity.  2. Diabetes mellitus 3. Hypertension 4. Hyperlipidemia 5. COPD: June, 2013 - FEV1 equals 0.89 L which is 48% of predicted value. Her DLCO is also markedly depressed. 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   Current medicines are reviewed at length with the patient today.  The patient does not have concerns regarding medicines.  The following changes have been made:  no change  Labs/ tests ordered today include:  No orders of the defined types were placed in this encounter.     Disposition:   FU with me in 6 months       Francetta Ilg, Wonda Cheng, MD  12/24/2014 9:27 AM    [facility] [place], [place], [place]  [address] Phone: [phone]; Fax: [phone]   [facility]  [address] [place], [place]  [address] [phone]    Fax [phone]

## 2014-12-24 NOTE — Patient Instructions (Signed)
Medication Instructions:  Your physician recommends that you continue on your current medications as directed. Please refer to the Current Medication list given to you today.   Labwork: None Ordered   Testing/Procedures: Your physician has requested that you have an echocardiogram. Echocardiography is a painless test that uses sound waves to create images of your heart. It provides your doctor with information about the size and shape of your heart and how well your heart's chambers and valves are working. This procedure takes approximately one hour. There are no restrictions for this procedure.   Follow-Up Your physician wants you to follow-up in: 6 months with Dr. Nahser.  You will receive a reminder letter in the mail two months in advance. If you don't receive a letter, please call our office to schedule the follow-up appointment.   If you need a refill on your cardiac medications before your next appointment, please call your pharmacy.   Thank you for choosing [facility]! Michelle Swinyer, RN [phone]    

## 2015-01-07 ENCOUNTER — Other Ambulatory Visit: Payer: Self-pay

## 2015-01-07 ENCOUNTER — Ambulatory Visit (HOSPITAL_COMMUNITY): Payer: Medicare Other | Attending: Cardiovascular Disease

## 2015-01-07 ENCOUNTER — Telehealth: Payer: Self-pay | Admitting: Nurse Practitioner

## 2015-01-07 ENCOUNTER — Other Ambulatory Visit: Payer: Self-pay | Admitting: Nurse Practitioner

## 2015-01-07 DIAGNOSIS — E119 Type 2 diabetes mellitus without complications: Secondary | ICD-10-CM | POA: Insufficient documentation

## 2015-01-07 DIAGNOSIS — I517 Cardiomegaly: Secondary | ICD-10-CM | POA: Insufficient documentation

## 2015-01-07 DIAGNOSIS — I059 Rheumatic mitral valve disease, unspecified: Secondary | ICD-10-CM | POA: Diagnosis not present

## 2015-01-07 DIAGNOSIS — I10 Essential (primary) hypertension: Secondary | ICD-10-CM | POA: Insufficient documentation

## 2015-01-07 DIAGNOSIS — R0789 Other chest pain: Secondary | ICD-10-CM | POA: Diagnosis present

## 2015-01-07 DIAGNOSIS — Z951 Presence of aortocoronary bypass graft: Secondary | ICD-10-CM | POA: Diagnosis not present

## 2015-01-07 DIAGNOSIS — I340 Nonrheumatic mitral (valve) insufficiency: Secondary | ICD-10-CM | POA: Diagnosis not present

## 2015-01-07 DIAGNOSIS — E785 Hyperlipidemia, unspecified: Secondary | ICD-10-CM | POA: Diagnosis not present

## 2015-01-07 DIAGNOSIS — I071 Rheumatic tricuspid insufficiency: Secondary | ICD-10-CM | POA: Diagnosis not present

## 2015-01-07 MED ORDER — CLOPIDOGREL BISULFATE 75 MG PO TABS: 75.0000 mg | ORAL_TABLET | Freq: Every morning | ORAL | Status: DC

## 2015-01-07 NOTE — Telephone Encounter (Signed)
Spoke with patient to review echo results.  She verbalized understanding and agreement.  She states when she went to her pharmacy they advised her they have not received a Rx for clopidogrel.  The patient does not know how long she has been without the medication.  She states she is scheduled for a GI procedure tomorrow and Dr. Fuller Plan advised her to hold her clopidogrel tomorrow and she realized that she has not been taking it for an unknown time.  I advised her that I am sending a new Rx for clopidogrel 75 mg and that she will need to stop omeprazole and ask Dr. Fuller Plan for a different medication that does not interfere with clopidogrel.  She verbalized understanding and agreement.

## 2015-01-08 ENCOUNTER — Encounter: Payer: Self-pay | Admitting: Gastroenterology

## 2015-01-08 ENCOUNTER — Ambulatory Visit (AMBULATORY_SURGERY_CENTER): Payer: Medicare Other | Admitting: Gastroenterology

## 2015-01-08 VITALS — BP 117/59 | HR 50 | Temp 96.5°F | Resp 19 | Ht 63.0 in | Wt 191.0 lb

## 2015-01-08 DIAGNOSIS — K2990 Gastroduodenitis, unspecified, without bleeding: Secondary | ICD-10-CM | POA: Diagnosis not present

## 2015-01-08 DIAGNOSIS — K2970 Gastritis, unspecified, without bleeding: Secondary | ICD-10-CM | POA: Diagnosis not present

## 2015-01-08 DIAGNOSIS — R1314 Dysphagia, pharyngoesophageal phase: Secondary | ICD-10-CM

## 2015-01-08 DIAGNOSIS — R1319 Other dysphagia: Secondary | ICD-10-CM

## 2015-01-08 DIAGNOSIS — R933 Abnormal findings on diagnostic imaging of other parts of digestive tract: Secondary | ICD-10-CM

## 2015-01-08 DIAGNOSIS — R1310 Dysphagia, unspecified: Secondary | ICD-10-CM

## 2015-01-08 DIAGNOSIS — K2950 Unspecified chronic gastritis without bleeding: Secondary | ICD-10-CM | POA: Diagnosis not present

## 2015-01-08 DIAGNOSIS — K222 Esophageal obstruction: Secondary | ICD-10-CM | POA: Diagnosis not present

## 2015-01-08 LAB — GLUCOSE, CAPILLARY
GLUCOSE-CAPILLARY: 142 mg/dL — AB (ref 65–99)
Glucose-Capillary: 113 mg/dL — ABNORMAL HIGH (ref 65–99)

## 2015-01-08 MED ORDER — PANTOPRAZOLE SODIUM 40 MG PO TBEC
40.0000 mg | DELAYED_RELEASE_TABLET | Freq: Every day | ORAL | Status: DC
Start: 2015-01-08 — End: 2015-11-19

## 2015-01-08 MED ORDER — SODIUM CHLORIDE 0.9 % IV SOLN: 500.0000 mL | INTRAVENOUS | Status: DC

## 2015-01-08 NOTE — Patient Instructions (Signed)
YOU HAD AN ENDOSCOPIC PROCEDURE TODAY AT [facility]:   Refer to the procedure report that was given to you for any specific questions about what was found during the examination.  If the procedure report does not answer your questions, please call your gastroenterologist to clarify.  If you requested that your care partner not be given the details of your procedure findings, then the procedure report has been included in a sealed envelope for you to review at your convenience later.  YOU SHOULD EXPECT: Some feelings of bloating in the abdomen. Passage of more gas than usual.  Walking can help get rid of the air that was put into your GI tract during the procedure and reduce the bloating. If you had a lower endoscopy (such as a colonoscopy or flexible sigmoidoscopy) you may notice spotting of blood in your stool or on the toilet paper. If you underwent a bowel prep for your procedure, you may not have a normal bowel movement for a few days.  Please Note:  You might notice some irritation and congestion in your nose or some drainage.  This is from the oxygen used during your procedure.  There is no need for concern and it should clear up in a day or so.  SYMPTOMS TO REPORT IMMEDIATELY:     Following upper endoscopy (EGD)  Vomiting of blood or coffee ground material  New chest pain or pain under the shoulder blades  Painful or persistently difficult swallowing  New shortness of breath  Fever of 100F or higher  Black, tarry-looking stools  For urgent or emergent issues, a gastroenterologist can be reached at any hour by calling [phone].   DIET: Follow Dilation Handout.  ACTIVITY:  You should plan to take it easy for the rest of today and you should NOT DRIVE or use heavy machinery until tomorrow (because of the sedation medicines used during the test).    FOLLOW UP: Our staff will call the number listed on your records the next business day following your procedure to  check on you and address any questions or concerns that you may have regarding the information given to you following your procedure. If we do not reach you, we will leave a message.  However, if you are feeling well and you are not experiencing any problems, there is no need to return our call.  We will assume that you have returned to your regular daily activities without incident.  If any biopsies were taken you will be contacted by phone or by letter within the next 1-3 weeks.  Please call us at [phone] if you have not heard about the biopsies in 3 weeks.    SIGNATURES/CONFIDENTIALITY: You and/or your care partner have signed paperwork which will be entered into your electronic medical record.  These signatures attest to the fact that that the information above on your After Visit Summary has been reviewed and is understood.  Full responsibility of the confidentiality of this discharge information lies with you and/or your care-partner.  Resume Plavix tomorrow,remainder of medication today. Information given on gastritis and dilation diet.

## 2015-01-08 NOTE — Progress Notes (Signed)
Called to room to assist during endoscopic procedure.  Patient ID and intended procedure confirmed with present staff. Received instructions for my participation in the procedure from the performing physician.  

## 2015-01-08 NOTE — Progress Notes (Signed)
To recovery, report to Brown, RN, VSS. 

## 2015-01-08 NOTE — Op Note (Signed)
[place]  [address] [place], [address]   ENDOSCOPY PROCEDURE REPORT  PATIENT: [name], [name]  MR#: [id number] BIRTHDATE: 01/18/1941 , 47  yrs. old GENDER: female ENDOSCOPIST: Ladene Artist, MD, Marval Regal REFERRED BY:   Tera Partridge, MD PROCEDURE DATE:  01/08/2015 PROCEDURE:  EGD w/ biopsy and EGD w/ wire guided (savary) dilation  ASA CLASS:     Class III INDICATIONS:  dysphagia and abnormal barium esophagogram. MEDICATIONS: Monitored anesthesia care and Propofol 200 mg IV TOPICAL ANESTHETIC: none DESCRIPTION OF PROCEDURE: After the risks benefits and alternatives of the procedure were thoroughly explained, informed consent was obtained.  The [id number] endoscope was introduced through the mouth and advanced to the second portion of the duodenum , Without limitations.  The instrument was slowly withdrawn as the mucosa was fully examined.  STOMACH: Gastritis was found in the gastric antrum.  Multiple biopsies were performed.  A subepithelial and soft 2cm subepithelial lesion was located in the prepyloric region of the stomach and gastric antrum.   The stomach otherwise appeared normal. ESOPHAGUS: There was a short benign appearing stricture, with an inner diameter of 33mm, at the gastroesophageal junction.  The stricture was easily traversable. The stricture was dilated using 16mm, 26mm and 50mm (45Fr) savary dilator over a guidewire. Minimal resistance to last 2 dilators. No heme. The esophagus otherwise appeared normal. DUODENUM: Mild duodenal inflammation was found in the duodenal bulb. The duodenal mucosa showed no abnormalities in the 2nd part of the duodenum.  Retroflexed views revealed no abnormalities.  The scope was then withdrawn from the patient and the procedure completed. COMPLICATIONS: There were no immediate complications.  ENDOSCOPIC IMPRESSION: 1.   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    Subepithelial lesion in the prepyloric region of the stomach and gastric antrum 3.   Stricture at the gastroesophageal junction; dilated using savary dilators over guidewire 4.   Duodenitis in the duodenal bulb  RECOMMENDATIONS: 1.  Await biopsy results 2.  Anti-reflux regimen 3.  Post dilation instructions 4.  Consider EUS to further evaluate subephitheal gastric lesion 5.  Resume Plavix tomorrow  eSigned:  Ladene Artist, MD, [facility] 01/08/2015 10:41 AM

## 2015-01-09 ENCOUNTER — Telehealth: Payer: Self-pay

## 2015-01-09 NOTE — Telephone Encounter (Signed)
  Follow up Call-  Call back number 01/08/2015  Post procedure Call Back phone  # [phone]  Permission to leave phone message Yes     Patient questions:  Do you have a fever, pain , or abdominal swelling? No. Pain Score  0 *  Have you tolerated food without any problems? Yes.    Have you been able to return to your normal activities? Yes.    Do you have any questions about your discharge instructions: Diet   No. Medications  No. Follow up visit  No.  Do you have questions or concerns about your Care? No.  Actions: * If pain score is 4 or above: No action needed, pain <4.

## 2015-01-21 ENCOUNTER — Encounter: Payer: Self-pay | Admitting: Gastroenterology

## 2015-02-17 ENCOUNTER — Ambulatory Visit: Payer: Medicare Other | Admitting: Internal Medicine

## 2015-02-21 ENCOUNTER — Ambulatory Visit (INDEPENDENT_AMBULATORY_CARE_PROVIDER_SITE_OTHER): Payer: Medicare Other | Admitting: Pulmonary Disease

## 2015-02-21 ENCOUNTER — Encounter: Payer: Self-pay | Admitting: Pulmonary Disease

## 2015-02-21 VITALS — BP 142/78 | HR 82 | Temp 98.1°F | Ht 63.0 in | Wt 184.0 lb

## 2015-02-21 DIAGNOSIS — J3089 Other allergic rhinitis: Secondary | ICD-10-CM

## 2015-02-21 DIAGNOSIS — J4551 Severe persistent asthma with (acute) exacerbation: Secondary | ICD-10-CM | POA: Diagnosis not present

## 2015-02-21 DIAGNOSIS — J45909 Unspecified asthma, uncomplicated: Secondary | ICD-10-CM | POA: Diagnosis not present

## 2015-02-21 DIAGNOSIS — G4733 Obstructive sleep apnea (adult) (pediatric): Secondary | ICD-10-CM | POA: Diagnosis not present

## 2015-02-21 DIAGNOSIS — K219 Gastro-esophageal reflux disease without esophagitis: Secondary | ICD-10-CM

## 2015-02-21 LAB — PULMONARY FUNCTION TEST
FEF 25-75 POST: 0.94 L/s
FEF 25-75 Pre: 0.51 L/sec
FEF2575-%CHANGE-POST: 83 %
FEF2575-%PRED-POST: 68 %
FEF2575-%PRED-PRE: 37 %
FEV1-%Change-Post: 19 %
FEV1-%Pred-Post: 78 %
FEV1-%Pred-Pre: 65 %
FEV1-Post: 1.23 L
FEV1-Pre: 1.03 L
FEV1FVC-%CHANGE-POST: 4 %
FEV1FVC-%PRED-PRE: 84 %
FEV6-%Change-Post: 13 %
FEV6-%PRED-PRE: 82 %
FEV6-%Pred-Post: 93 %
FEV6-Post: 1.82 L
FEV6-Pre: 1.6 L
FEV6FVC-%Change-Post: 0 %
FEV6FVC-%Pred-Post: 104 %
FEV6FVC-%Pred-Pre: 104 %
FVC-%Change-Post: 13 %
FVC-%PRED-PRE: 78 %
FVC-%Pred-Post: 89 %
FVC-POST: 1.82 L
FVC-PRE: 1.6 L
POST FEV1/FVC RATIO: 67 %
POST FEV6/FVC RATIO: 100 %
PRE FEV1/FVC RATIO: 64 %
Pre FEV6/FVC Ratio: 100 %

## 2015-02-21 MED ORDER — BENZONATATE 100 MG PO CAPS: 100.0000 mg | ORAL_CAPSULE | Freq: Three times a day (TID) | ORAL | Status: DC | PRN

## 2015-02-21 MED ORDER — PREDNISONE 20 MG PO TABS: 40.0000 mg | ORAL_TABLET | Freq: Every day | ORAL | Status: DC

## 2015-02-21 NOTE — Patient Instructions (Signed)
1. Stop using your Qvar and Breo completely 2. Continue using your budesonide and Perforomist as prescribed. 3. He did use your albuterol in nebulizer or pump perform at least twice daily while you're sick 4. I'm prescribing Tessalon Perles as well as prednisone to help with your cough. 5. You can cancel your appointment in 1-2 weeks if you are completely better. 6. I will see you back in 3 months with breathing tests at that time to see how you are doing. Contact me if you have any new problems with your breathing  TEST ORDERED: 1. Spirometry with bronchodilator challenge at 3 month appointment

## 2015-02-21 NOTE — Progress Notes (Signed)
Subjective:    Patient ID: [name], female    DOB: 1940/10/28, 75 y.o.   MRN: [id number]  C.C.:  Follow-up for Asthma w/ Severe Fixed Airways Obstruction, Nocturnal Hypoxia, & GERD.  HPI  She developed a respiratory illness.  Asthma w/ Severe Airways Obstruction:  Switched from Breo and Qvar at last appointment to budesonide and Perforomist via nebulizer. However she has continued using Breo once daily. She reports continued coughing & wheezing starting 2.5 weeks ago. She reports compliance with her regimen. Has been using her albuterol via nebulizer at least twice daily since she has been sick. Her PCP gave her an IM injection of steroids on Wednesday as well as Levaquin. She is waking up at night coughing.   Mild-Moderate OSA:  Being followed by Dr. Rexene Alberts.   Allergic Rhinitis:  She reports significant improvement in her sinus congestion & drainage on Singulair. Still doing saline rinse as well.  GERD:   Patient was found to have Schatzki's ring on barium swallow. She was seen by GI and had dilation of the ring. Still having some dysphagia but is more cautious about swallowing. Patient taking Protonix & Pepcid. Reflux symptoms are only with particular foods. Otherwise no reflux.  Review of Systems Reports her prior fever has resolved. No chills or sweats. She reports she was having chest discomfort previously with her illness but this has resolved.   Allergies  Allergen Reactions  . Amlodipine   . Banana Nausea And Vomiting    Stomach pumped  . Co Q10 [Coenzyme Q10]     Body cramps  . Codeine     Hallucinate, loose identity and don't know who I am  . Morphine Other (See Comments)    Can not function, it immobilizes me   . Sulfonamide Derivatives Swelling   Current Outpatient Prescriptions on File Prior to Visit  Medication Sig Dispense Refill  . albuterol (PROVENTIL HFA;VENTOLIN HFA) 108 (90 BASE) MCG/ACT inhaler Inhale 2 puffs into the lungs every 6 (six) hours as needed.  For shortness of breath. 1 Inhaler 0  . albuterol (PROVENTIL) (2.5 MG/3ML) 0.083% nebulizer solution Take 3 mLs (2.5 mg total) by nebulization every 6 (six) hours as needed. For shortness of breath. 360 mL 11  . ALPRAZolam (XANAX) 0.25 MG tablet Take 0.25 mg by mouth 2 (two) times daily as needed.     . baclofen (LIORESAL) 20 MG tablet Take 1 tablet by mouth 2 (two) times daily.    . beclomethasone (QVAR) 80 MCG/ACT inhaler Inhale 3 puffs into the lungs 2 (two) times daily. 2 Inhaler 0  . BELSOMRA 5 MG TABS Take 5 mg by mouth daily.  2  . budesonide (PULMICORT) 0.5 MG/2ML nebulizer solution Take 2 mLs (0.5 mg total) by nebulization 2 (two) times daily. 120 mL 11  . Calcium & Magnesium Carbonates (MYLANTA PO) Take 1 tablet by mouth daily as needed. For indigestion.    . canagliflozin (INVOKANA) 300 MG TABS tablet Take 300 mg by mouth daily before breakfast.    . Cholecalciferol (VITAMIN D) 2000 UNITS tablet Take 2,000 Units by mouth daily.     . clopidogrel (PLAVIX) 75 MG tablet Take 1 tablet (75 mg total) by mouth every morning. 90 tablet 3  . Cyanocobalamin (VITAMIN B-12 IJ) Inject as directed every 30 (thirty) days.    Marland Kitchen diltiazem (CARDIZEM CD) 240 MG 24 hr capsule Take 1 capsule (240 mg total) by mouth daily. 30 capsule 10  . doxazosin (CARDURA) 8 MG  tablet Take 1 tablet (8 mg total) by mouth daily. 30 tablet 7  . famotidine (PEPCID) 20 MG tablet Take 20 mg by mouth 2 (two) times daily.    . ferrous sulfate 325 (65 FE) MG tablet Take 1 tablet (325 mg total) by mouth 3 (three) times daily after meals. 30 tablet 0  . Fluticasone Furoate-Vilanterol (BREO ELLIPTA) 200-25 MCG/INH AEPB Inhale 1 puff into the lungs daily. 2 each 0  . formoterol (PERFOROMIST) 20 MCG/2ML nebulizer solution Take 2 mLs (20 mcg total) by nebulization 2 (two) times daily. 120 mL 11  . furosemide (LASIX) 40 MG tablet Take 40 mg by mouth every other day.     Marland Kitchen HYDROmorphone (DILAUDID) 2 MG tablet Take 1 tablet (2 mg total) by  mouth every 4 (four) hours as needed for severe pain. 80 tablet 0  . insulin lispro protamine-insulin lispro (HUMALOG 75/25) (75-25) 100 UNIT/ML SUSP 15 in am, 15 in evening    . isosorbide mononitrate (IMDUR) 30 MG 24 hr tablet Take 1 tablet (30 mg total) by mouth daily. 31 tablet 11  . meclizine (ANTIVERT) 25 MG tablet Take 25 mg by mouth as needed for dizziness (dizziness).     . metoprolol succinate (TOPROL-XL) 50 MG 24 hr tablet Take 1 tablet by mouth daily.    . montelukast (SINGULAIR) 5 MG chewable tablet Chew 1 tablet (5 mg total) by mouth at bedtime. 30 tablet 3  . Multiple Vitamin (MULTIVITAMIN WITH MINERALS) TABS Take 1 tablet by mouth daily.    . Nebulizers (COMPRESSOR/NEBULIZER) MISC Use with albuterol 1 each 0  . nitroGLYCERIN (NITROSTAT) 0.4 MG SL tablet Place 0.4 mg under the tongue every 5 (five) minutes x 3 doses as needed. For chest pain.    . potassium chloride SA (K-DUR,KLOR-CON) 20 MEQ tablet Take 20 mEq by mouth 2 (two) times daily.    . pantoprazole (PROTONIX) 40 MG tablet Take 1 tablet (40 mg total) by mouth daily. 30 tablet 11   No current facility-administered medications on file prior to visit.   Past Medical History  Diagnosis Date  . Coronary artery disease   . Diabetes mellitus   . Hypertension   . Hyperlipidemia   . Shoulder injury     resolved after shoulder surgery  . Chest pain   . Acute myocardial infarction, unspecified site, episode of care unspecified   . Unspecified asthma(493.90)   . PONV (postoperative nausea and vomiting)   . Anxiety   . GERD (gastroesophageal reflux disease)   . Upper respiratory symptom     02-13-14 "runny nose, mild cough,sniffles" -instructed to inform MD if symptoms worsens  . Schatzki's ring   . Hiatal hernia   . Adenomatous colon polyp    Past Surgical History  Procedure Laterality Date  . Cardiac catheterization  07/23/2009    EF 60%  . Cardiac catheterization  10/11/2008  . Cardiac catheterization  03/01/2007    EF  75-80%  . Coronary artery bypass graft      SEVERELY DISEASED SAPHENOUS VEIN GRAFT TO THE RIGHT CORONARY ARTERY BUT WITH FAIRLY WELL PRESERVED FLOW TO THE DISTAL RIGHT CORONARY ARTERY FROM THE NATIVE CIRCULATION-RESTART  CATH IN JUNE 2000, REVEALS MILD/MODERATE  CAD WITH GOOD FLOW DOWN HER LAD  . US echocardiography  03/08/2008    EF 55-60%  . Cardiovascular stress test  11/15/2007    EF 60%  . Rotator cuff repair      right and left  . Tumor removed kidney    .  Tonsillectomy      age 81  . Abdominal hysterectomy    . Appendectomy      came out with Hysterectomy  . Eye surgery      bilateral cataract surgery with lens implant  . Total knee arthroplasty Right 02/20/2014    Procedure: RIGHT TOTAL KNEE ARTHROPLASTY;  Surgeon: Tobi Bastos, MD;  Location: [facility];  Service: Orthopedics;  Laterality: Right;  . Colonoscopy    . Esophagogastroduodenoscopy    . Esophageal dilation    . Lense removal Left    Family History  Problem Relation Age of Onset  . Emphysema Paternal Uncle   . Heart disease Maternal Grandfather   . Heart failure Maternal Grandfather   . Esophageal cancer Brother 24    she said he was born with it  . Heart attack Father   . Stomach cancer Father   . Neuropathy Neg Hx   . Multiple sclerosis Neg Hx   . Rheum arthritis Sister   . Emphysema Paternal Aunt   . Colon cancer Neg Hx    Social History   Social History  . Marital Status: Widowed    Spouse Name: N/A  . Number of Children: 4  . Years of Education: Doctorate   Occupational History  . Retired    Social History Main Topics  . Smoking status: Former Smoker -- 0.50 packs/day for 21 years    Types: Cigarettes    Start date: 02/09/1955    Quit date: 02/09/1976  . Smokeless tobacco: Never Used  . Alcohol Use: No  . Drug Use: No  . Sexual Activity: Not Asked   Other Topics Concern  . None   Social History Narrative   Lives alone.   Caffeine use: Drinks 1 cup coffee/day      Originally from  [place]. Previously has lived in [place]. Prior travel to [place], [place], [place], [place], [place], [place], [place], & [place]. Previously worked in [employer] She has a dog currently. No bird, mold, or hot tub exposure. She also pastors a church.       Objective:   Physical Exam BP 142/78 mmHg  Pulse 82  Temp(Src) 98.1 F (36.7 C) (Oral)  Ht 5\' 3"  (1.6 m)  Wt 184 lb (83.462 kg)  BMI 32.60 kg/m2  SpO2 97% General:  Obese female. Alert. Intermittent coughing. Integument:  Warm & dry. No bruising or rash on exposed skin. HEENT:  Moist mucus membranes. Moderate bilateral nasal turbinate swelling. No scleral icterus.  Cardiovascular:  Regular rate. No edema. No appreciable JVD.  Pulmonary:  Good aeration bilaterally and otherwise clear to auscultation. Patient continually having frequent coughing while I was in the room. Speaking in complete sentences.  Abdomen: Soft. Normal bowel sounds. Protuberant. Nontender. Musculoskeletal:  Normal bulk and tone. Hand grip strength 5/5 bilaterally. No joint deformity or effusion appreciated.  PFT 02/21/15: FVC 1.60 L (78%) FEV1 1.03 L (65%) FEV1/FVC 0.64 FEF 25-75 1.60 L (82%) positive bronchodilator response 11/20/14: FVC 1.44 L (70%) FEV1 0.91 L (57%) FEV1/FVC 0.63 FEF 25-75 0.47 L (33%) positive bronchodilator response TLC 3.92 L (79%) RV 103% DLCO uncorrected 64% 11/12/13: FVC 1.57 L (74%) FEV1 1.09 L (67%) FEV1/FVC 0.70 FEF 25-75 0.72 L (50%) negative bronchodilator response 08/04/11: FVC 1.61 L (60%) FEV1 0.89 L (48%) FEV1/FVC 0.56 FEF 25-75 0.29 L (14%) positive bronchodilator response TLC 3.67 L (80%) RV 103% ERV 46% DLCO corrected 38%  6MWT 11/20/14:  Walked 336 meters / Baseline Sat 100%  on RA / Nadir Sat 99% on RA (pt c/o pain in her right neck w/ dyspnea)  IMAGING BARIUM SWALLOW (10/15/14): Schatzki ring distal esophagus maximum diameter 1.2 cm with barium tablet impacting at this ring.  CXR PA/LAT 02/13/14 (previously reviewed by me):  Sternotomy  wires from prior thoracic surgery noted. Heart normal in size. No pleural effusion appreciated. No parenchymal nodule or opacification appreciated. Mild flattening of the diaphragms bilaterally.  CARDIAC TTE (10/06/11): LV normal in size. Normal regional wall motion. EF 60-65%. Grade 1 diastolic dysfunction. LA & RA normal in size. 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 RV normal in size and function. RVSP 19 mmHg. No aortic stenosis or regurgitation. No mitral stenosis or regurgitation. No pulmonic stenosis. Trivial tricuspid regurgitation. No pericardial effusion.  LABS 10/08/14 IgG: 1060 IgM: 112 IgA: 272 IgE: 5 CBC: 6.1/11.3/36.0/293 Differential: Eos 0.3 (4.5%)  RAST panel: Negative Aspergillus antigen: <0.1  1/26/9 ABG:  7.39/44/113    Assessment & Plan:  75 year old female with severe, persistent asthma with airways obstruction. Patient presenting with exacerbation but ongoing given continued coughing. I'm encouraged by the improvement in her spirometry despite significant bronchodilator response today owing to the fact that there is improving drug delivery on her current nebulizer regimen. I believe she would benefit from oral steroid use despite her cautious in agreement with this. Reflux seems to be better controlled and dysphagia is better after her esophageal dilation for her Schatzki's ring. Additionally her allergic rhinitis seems to be well-controlled on Singulair. I instructed the patient to contact my office if she had any new breathing calls before next appointment as I would be happy to see her sooner.  1. Severe, persistent asthma with acute exacerbation and fixed airways obstruction: Continuing Perforomist and budesonide. Discontinuing Qvar and Breo completely. Prednisone 40 mg by mouth daily 6 days and Tessalon Perles for cough suppression. Repeat from 2 with bronchodilator challenge at 3 month follow-up appointment. 2. GERD: Status post dilation for Schatzki's  ring. Continuing on Protonix and Pepcid. Follows with GI. 3. Allergic rhinitis: Significant improvement with nasal saline rinse & Singulair. No changes. 4. OSA: Management per neurology. 5. Follow-up: Patient to return to clinic in 1-2 weeks and also in 3 months.

## 2015-02-21 NOTE — Progress Notes (Signed)
Spirometry pre and post done today. 

## 2015-03-04 ENCOUNTER — Telehealth: Payer: Self-pay | Admitting: Pulmonary Disease

## 2015-03-04 MED ORDER — PREDNISONE 10 MG PO TABS: ORAL_TABLET | ORAL | Status: DC

## 2015-03-04 MED ORDER — DOXYCYCLINE HYCLATE 100 MG PO TABS: 100.0000 mg | ORAL_TABLET | Freq: Two times a day (BID) | ORAL | Status: DC

## 2015-03-04 NOTE — Telephone Encounter (Signed)
Please give her instructions & send in prescriptions for the following: 1. Doxycycline 100mg  po bid x 7 days - instruct her to avoid taking with dairy products. Instruct her to take with a full glass of water & remain upright for 1 hour after. 2. Prednisone Taper:  40mg  po daily x3 days, then 30mg  daily x3 days, then 20mg  daily x3 days, then 10mg  daily x3 days & stop. 3. Have her use her albuterol 2-3 times daily while she's sick and call us if it doesn't resolve or gets worse.

## 2015-03-04 NOTE — Telephone Encounter (Signed)
Spoke with pt. Saw [name] on 02/21/15 and was feeling better but her symptoms have returned. Reports cough, SOB, chest tightness, wheezing and fever. Temp is 101. Cough is non productive. Has not tried any OTC mediations. Would like something to be sent in.  JN - please advise. Thanks.

## 2015-03-04 NOTE — Telephone Encounter (Signed)
Spoke with pt. She is aware of JN's recommendations. Rxs have been sent in. Nothing further was needed. 

## 2015-03-13 ENCOUNTER — Other Ambulatory Visit (INDEPENDENT_AMBULATORY_CARE_PROVIDER_SITE_OTHER): Payer: Medicare Other

## 2015-03-13 ENCOUNTER — Ambulatory Visit (INDEPENDENT_AMBULATORY_CARE_PROVIDER_SITE_OTHER)
Admission: RE | Admit: 2015-03-13 | Discharge: 2015-03-13 | Disposition: A | Discharge status: Home / Self Care | Payer: Medicare Other | Source: Ambulatory Visit | Attending: Pulmonary Disease | Admitting: Pulmonary Disease

## 2015-03-13 ENCOUNTER — Ambulatory Visit (INDEPENDENT_AMBULATORY_CARE_PROVIDER_SITE_OTHER): Payer: Medicare Other | Admitting: Pulmonary Disease

## 2015-03-13 ENCOUNTER — Encounter: Payer: Self-pay | Admitting: Pulmonary Disease

## 2015-03-13 VITALS — BP 130/82 | HR 82 | Ht 63.0 in | Wt 189.6 lb

## 2015-03-13 DIAGNOSIS — K219 Gastro-esophageal reflux disease without esophagitis: Secondary | ICD-10-CM

## 2015-03-13 DIAGNOSIS — J4551 Severe persistent asthma with (acute) exacerbation: Secondary | ICD-10-CM

## 2015-03-13 DIAGNOSIS — J3089 Other allergic rhinitis: Secondary | ICD-10-CM | POA: Diagnosis not present

## 2015-03-13 LAB — CBC WITH DIFFERENTIAL/PLATELET
BASOS PCT: 0.1 % (ref 0.0–3.0)
Basophils Absolute: 0 10*3/uL (ref 0.0–0.1)
EOS PCT: 1.2 % (ref 0.0–5.0)
Eosinophils Absolute: 0.1 10*3/uL (ref 0.0–0.7)
HEMATOCRIT: 37.3 % (ref 36.0–46.0)
HEMOGLOBIN: 11.8 g/dL — AB (ref 12.0–15.0)
LYMPHS PCT: 14.8 % (ref 12.0–46.0)
Lymphs Abs: 1.6 10*3/uL (ref 0.7–4.0)
MCHC: 31.5 g/dL (ref 30.0–36.0)
MCV: 80.2 fl (ref 78.0–100.0)
Monocytes Absolute: 0.2 10*3/uL (ref 0.1–1.0)
Monocytes Relative: 2 % — ABNORMAL LOW (ref 3.0–12.0)
NEUTROS ABS: 8.9 10*3/uL — AB (ref 1.4–7.7)
Neutrophils Relative %: 81.9 % — ABNORMAL HIGH (ref 43.0–77.0)
PLATELETS: 245 10*3/uL (ref 150.0–400.0)
RBC: 4.66 Mil/uL (ref 3.87–5.11)
RDW: 19.5 % — AB (ref 11.5–15.5)
WBC: 10.9 10*3/uL — AB (ref 4.0–10.5)

## 2015-03-13 MED ORDER — BENZONATATE 100 MG PO CAPS: 100.0000 mg | ORAL_CAPSULE | Freq: Three times a day (TID) | ORAL | Status: DC | PRN

## 2015-03-13 NOTE — Progress Notes (Signed)
Subjective:    Patient ID: [name], female    DOB: [birth year], 75 y.o.   MRN: [id number]  C.C.:  Follow-up for Asthma w/ Severe Fixed Airways Obstruction, Nocturnal Hypoxia, & GERD.  HPI  Asthma w/ Severe Airways Obstruction w/ Exacerbation:  Continuing on Perforomist and budesonide at last appointment. Treated with a six-day course of prednisone as well as Tessalon Perles. Per previous discussions patient had improvement on prednisone but then developed temperature to 101F with return of symptoms. Started on a prednisone taper as well as doxycycline twice daily on 03/04/15. She reports her cough persists but her fever has resolved. She reports her coughing is worse in the evening. She has had sweats but no chills. Reports her cough is nonproductive. Has had wheezing. Has been using her nebulizer twice daily sparingly due to cost. Feels she is "70%" better than before.   Allergic Rhinitis:  Prescribed Singulair. She reports she has some hoarseness to her voice and feels it is coming from post-nasal drainage. Denies any sinus pressure or pain.   GERD:   Status post dilation for Schatzki's ring. Follows with GI. Prescribed Protonix & Pepcid. She reports she his having some reflux depending on her diet.   Mild-Moderate OSA:  Being followed by Dr. Rexene Alberts.   Review of Systems She reports some chest pressure she attributes to her cough. No chest pain. She reports a mild headache around her temples. Denies any acute vision changes or vision loss.   Allergies  Allergen Reactions  . Amlodipine   . Banana Nausea And Vomiting    Stomach pumped  . Co Q10 [Coenzyme Q10]     Body cramps  . Codeine     Hallucinate, loose identity and don't know who I am  . Morphine Other (See Comments)    Can not function, it immobilizes me   . Sulfonamide Derivatives Swelling   Current Outpatient Prescriptions on File Prior to Visit  Medication Sig Dispense Refill  . albuterol (PROVENTIL HFA;VENTOLIN HFA)  108 (90 BASE) MCG/ACT inhaler Inhale 2 puffs into the lungs every 6 (six) hours as needed. For shortness of breath. 1 Inhaler 0  . albuterol (PROVENTIL) (2.5 MG/3ML) 0.083% nebulizer solution Take 3 mLs (2.5 mg total) by nebulization every 6 (six) hours as needed. For shortness of breath. 360 mL 11  . ALPRAZolam (XANAX) 0.25 MG tablet Take 0.25 mg by mouth 2 (two) times daily as needed.     . baclofen (LIORESAL) 20 MG tablet Take 1 tablet by mouth 2 (two) times daily.    . BELSOMRA 5 MG TABS Take 5 mg by mouth daily.  2  . benzonatate (TESSALON) 100 MG capsule Take 1 capsule (100 mg total) by mouth 3 (three) times daily as needed for cough. 60 capsule 0  . budesonide (PULMICORT) 0.5 MG/2ML nebulizer solution Take 2 mLs (0.5 mg total) by nebulization 2 (two) times daily. 120 mL 11  . Calcium & Magnesium Carbonates (MYLANTA PO) Take 1 tablet by mouth daily as needed. For indigestion.    . canagliflozin (INVOKANA) 300 MG TABS tablet Take 300 mg by mouth daily before breakfast.    . Cholecalciferol (VITAMIN D) 2000 UNITS tablet Take 2,000 Units by mouth daily.     . clopidogrel (PLAVIX) 75 MG tablet Take 1 tablet (75 mg total) by mouth every morning. 90 tablet 3  . Cyanocobalamin (VITAMIN B-12 IJ) Inject as directed every 30 (thirty) days.    Marland Kitchen diltiazem (CARDIZEM CD) 240 MG 24  hr capsule Take 1 capsule (240 mg total) by mouth daily. 30 capsule 10  . doxazosin (CARDURA) 8 MG tablet Take 1 tablet (8 mg total) by mouth daily. 30 tablet 7  . famotidine (PEPCID) 20 MG tablet Take 20 mg by mouth 2 (two) times daily.    . ferrous sulfate 325 (65 FE) MG tablet Take 1 tablet (325 mg total) by mouth 3 (three) times daily after meals. 30 tablet 0  . formoterol (PERFOROMIST) 20 MCG/2ML nebulizer solution Take 2 mLs (20 mcg total) by nebulization 2 (two) times daily. 120 mL 11  . furosemide (LASIX) 40 MG tablet Take 40 mg by mouth every other day.     Marland Kitchen HYDROmorphone (DILAUDID) 2 MG tablet Take 1 tablet (2 mg  total) by mouth every 4 (four) hours as needed for severe pain. 80 tablet 0  . insulin lispro protamine-insulin lispro (HUMALOG 75/25) (75-25) 100 UNIT/ML SUSP 15 in am, 15 in evening    . isosorbide mononitrate (IMDUR) 30 MG 24 hr tablet Take 1 tablet (30 mg total) by mouth daily. 31 tablet 11  . meclizine (ANTIVERT) 25 MG tablet Take 25 mg by mouth as needed for dizziness (dizziness).     . metoprolol succinate (TOPROL-XL) 50 MG 24 hr tablet Take 1 tablet by mouth daily.    . montelukast (SINGULAIR) 5 MG chewable tablet Chew 1 tablet (5 mg total) by mouth at bedtime. 30 tablet 3  . Multiple Vitamin (MULTIVITAMIN WITH MINERALS) TABS Take 1 tablet by mouth daily.    . Nebulizers (COMPRESSOR/NEBULIZER) MISC Use with albuterol 1 each 0  . neomycin-polymyxin b-dexamethasone (MAXITROL) 3.5-10000-0.1 OINT Apply topically as needed    . nitroGLYCERIN (NITROSTAT) 0.4 MG SL tablet Place 0.4 mg under the tongue every 5 (five) minutes x 3 doses as needed. For chest pain.    . potassium chloride SA (K-DUR,KLOR-CON) 20 MEQ tablet Take 20 mEq by mouth 2 (two) times daily.    . prednisoLONE acetate (PRED FORTE) 1 % ophthalmic suspension Place 1 drop into the left eye 4 (four) times daily.  0  . pantoprazole (PROTONIX) 40 MG tablet Take 1 tablet (40 mg total) by mouth daily. 30 tablet 11   No current facility-administered medications on file prior to visit.   Past Medical History  Diagnosis Date  . Coronary artery disease   . Diabetes mellitus   . Hypertension   . Hyperlipidemia   . Shoulder injury     resolved after shoulder surgery  . Chest pain   . Acute myocardial infarction, unspecified site, episode of care unspecified   . Unspecified asthma(493.90)   . PONV (postoperative nausea and vomiting)   . Anxiety   . GERD (gastroesophageal reflux disease)   . Upper respiratory symptom     02-13-14 "runny nose, mild cough,sniffles" -instructed to inform MD if symptoms worsens  . Schatzki's ring   .  Hiatal hernia   . Adenomatous colon polyp    Past Surgical History  Procedure Laterality Date  . Cardiac catheterization  07/23/2009    EF 60%  . Cardiac catheterization  10/11/2008  . Cardiac catheterization  03/01/2007    EF 75-80%  . Coronary artery bypass graft      SEVERELY DISEASED SAPHENOUS VEIN GRAFT TO THE RIGHT CORONARY ARTERY BUT WITH FAIRLY WELL PRESERVED FLOW TO THE DISTAL RIGHT CORONARY ARTERY FROM THE NATIVE CIRCULATION-RESTART  CATH IN JUNE 2000, REVEALS MILD/MODERATE  CAD WITH GOOD FLOW DOWN HER LAD  . US echocardiography  03/08/2008    EF 55-60%  . Cardiovascular stress test  11/15/2007    EF 60%  . Rotator cuff repair      right and left  . Tumor removed kidney    . Tonsillectomy      age 75  . Abdominal hysterectomy    . Appendectomy      came out with Hysterectomy  . Eye surgery      bilateral cataract surgery with lens implant  . Total knee arthroplasty Right 02/20/2014    Procedure: RIGHT TOTAL KNEE ARTHROPLASTY;  Surgeon: Tobi Bastos, MD;  Location: [facility];  Service: Orthopedics;  Laterality: Right;  . Colonoscopy    . Esophagogastroduodenoscopy    . Esophageal dilation    . Lense removal Left    Family History  Problem Relation Age of Onset  . Emphysema Paternal Uncle   . Heart disease Maternal Grandfather   . Heart failure Maternal Grandfather   . Esophageal cancer Brother 79    she said he was born with it  . Heart attack Father   . Stomach cancer Father   . Neuropathy Neg Hx   . Multiple sclerosis Neg Hx   . Rheum arthritis Sister   . Emphysema Paternal Aunt   . Colon cancer Neg Hx    Social History   Social History  . Marital Status: Widowed    Spouse Name: N/A  . Number of Children: 4  . Years of Education: Doctorate   Occupational History  . Retired    Social History Main Topics  . Smoking status: Former Smoker -- 0.50 packs/day for 21 years    Types: Cigarettes    Start date: 02/09/1955    Quit date: 02/09/1976  . Smokeless  tobacco: Never Used  . Alcohol Use: No  . Drug Use: No  . Sexual Activity: Not Asked   Other Topics Concern  . None   Social History Narrative   Lives alone.   Caffeine use: Drinks 1 cup coffee/day      Originally from [place]. Previously has lived in [place]. Prior travel to [place], [place], [place], [place], [place], [place], [place], & [place]. Previously worked in [employer] She has a dog currently. No bird, mold, or hot tub exposure. She also pastors a church.       Objective:   Physical Exam BP 130/82 mmHg  Pulse 82  Ht 5\' 3"  (1.6 m)  Wt 189 lb 9.6 oz (86.002 kg)  BMI 33.59 kg/m2  SpO2 100% General:  Obese female. Comfortable. Sitting in chair. Integument:  Warm & dry. No bruising exposed skin. HEENT:  Moist mucus membranes. Moderate bilateral nasal turbinate swelling unchanged. No scleral injection.  Cardiovascular:  Regular rate. No edema. No appreciable JVD.  Pulmonary:  Clear bilaterally to auscultation. Decreased frequency of cough. Speaking in complete sentences.  Abdomen: Soft. Normal bowel sounds. Protuberant. Musculoskeletal:  Normal bulk and tone. No joint deformity or effusion appreciated.  PFT 02/21/15: FVC 1.60 L (78%) FEV1 1.03 L (65%) FEV1/FVC 0.64 FEF 25-75 1.60 L (82%) positive bronchodilator response 11/20/14: FVC 1.44 L (70%) FEV1 0.91 L (57%) FEV1/FVC 0.63 FEF 25-75 0.47 L (33%) positive bronchodilator response TLC 3.92 L (79%) RV 103% DLCO uncorrected 64% 11/12/13: FVC 1.57 L (74%) FEV1 1.09 L (67%) FEV1/FVC 0.70 FEF 25-75 0.72 L (50%) negative bronchodilator response 08/04/11: FVC 1.61 L (60%) FEV1 0.89 L (48%) FEV1/FVC 0.56 FEF 25-75 0.29 L (14%) positive bronchodilator response TLC 3.67 L (80%) RV  103% ERV 46% DLCO corrected 38%  6MWT 11/20/14:  Walked 336 meters / Baseline Sat 100% on RA / Nadir Sat 99% on RA (pt c/o pain in her right neck w/ dyspnea)  IMAGING BARIUM SWALLOW (10/15/14): Schatzki ring distal esophagus maximum diameter 1.2 cm with barium  tablet impacting at this ring.  CXR PA/LAT 02/13/14 (previously reviewed by me):  Sternotomy wires from prior thoracic surgery noted. Heart normal in size. No pleural effusion appreciated. No parenchymal nodule or opacification appreciated. Mild flattening of the diaphragms bilaterally.  CARDIAC TTE (10/06/11): LV normal in size. Normal regional wall motion. EF 60-65%. Grade 1 diastolic dysfunction. LA & RA normal in size. 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 RV normal in size and function. RVSP 19 mmHg. No aortic stenosis or regurgitation. No mitral stenosis or regurgitation. No pulmonic stenosis. Trivial tricuspid regurgitation. No pericardial effusion.  LABS 10/08/14 IgG: 1060 IgM: 112 IgA: 272 IgE: 5 CBC: 6.1/11.3/36.0/293 Differential: Eos 0.3 (4.5%)  RAST panel: Negative Aspergillus antigen: <0.1  1/26/9 ABG:  7.39/44/113    Assessment & Plan:  75 year old female with severe, persistent asthma with airways obstruction. Patient seems to be recovering from her recent upper respiratory illness but more slowly than expected. I suspect that her ongoing significant reflux is contributing with laryngo-esophageal reflux likely at night. Certainly an underlying infectious process is possible but given the resolution of her fevers this is unlikely. She is continuing on her current nebulizer regimen with good control of her wheezing as I can tell physical exam today. I instructed the patient to notify my office if she did not continue to clinically improve.  1. Severe, persistent asthma with acute exacerbation and fixed airways obstruction: Patient completing course of prednisone. Tessalon Perles for cough suppression. Checking chest x-ray PA/LAT, CBC, and Procalcitonin today. Continuing patient on Perforomist and budesonide nebulized twice daily. Already scheduled for repeat spirometry at next appointment. 2. GERD: Status post dilation for Schatzki's ring. Symptoms have worsened on  prednisone taper. Continuing patient on Protonix & Pepcid as prescribed with hopeful resolution as prednisone taper completes. 3. Allergic rhinitis: Significant improvement with nasal saline rinse & Singulair. No changes. 4. OSA: Management per neurology. 5. Follow-up: Keeping follow-up appointments in April.

## 2015-03-13 NOTE — Patient Instructions (Signed)
1. Complete your prednisone taper as prescribed. 2. I will contact you if her lab work or x-ray are abnormal. 3. Use the Tessalon Perles to help with your cough. 4. I will see you back in April but please call my office if you are not improving over the next week.

## 2015-03-16 LAB — PROCALCITONIN: Procalcitonin: <0.1 ng/mL (ref <0.10)

## 2015-03-31 ENCOUNTER — Encounter: Payer: Self-pay | Admitting: Internal Medicine

## 2015-03-31 ENCOUNTER — Other Ambulatory Visit (INDEPENDENT_AMBULATORY_CARE_PROVIDER_SITE_OTHER): Payer: Medicare Other

## 2015-03-31 ENCOUNTER — Ambulatory Visit (INDEPENDENT_AMBULATORY_CARE_PROVIDER_SITE_OTHER): Payer: Medicare Other | Admitting: Internal Medicine

## 2015-03-31 VITALS — BP 144/78 | Temp 98.7°F | Resp 16 | Wt 189.0 lb

## 2015-03-31 DIAGNOSIS — I10 Essential (primary) hypertension: Secondary | ICD-10-CM

## 2015-03-31 DIAGNOSIS — E538 Deficiency of other specified B group vitamins: Secondary | ICD-10-CM | POA: Diagnosis not present

## 2015-03-31 DIAGNOSIS — R159 Full incontinence of feces: Secondary | ICD-10-CM

## 2015-03-31 DIAGNOSIS — E119 Type 2 diabetes mellitus without complications: Secondary | ICD-10-CM

## 2015-03-31 DIAGNOSIS — Z794 Long term (current) use of insulin: Secondary | ICD-10-CM

## 2015-03-31 DIAGNOSIS — K219 Gastro-esophageal reflux disease without esophagitis: Secondary | ICD-10-CM

## 2015-03-31 DIAGNOSIS — I2510 Atherosclerotic heart disease of native coronary artery without angina pectoris: Secondary | ICD-10-CM

## 2015-03-31 DIAGNOSIS — R252 Cramp and spasm: Secondary | ICD-10-CM | POA: Diagnosis not present

## 2015-03-31 LAB — COMPREHENSIVE METABOLIC PANEL
ALBUMIN: 4.2 g/dL (ref 3.5–5.2)
ALK PHOS: 91 U/L (ref 39–117)
ALT: 13 U/L (ref 0–35)
AST: 15 U/L (ref 0–37)
BUN: 18 mg/dL (ref 6–23)
CO2: 31 mEq/L (ref 19–32)
CREATININE: 0.82 mg/dL (ref 0.40–1.20)
Calcium: 9.3 mg/dL (ref 8.4–10.5)
Chloride: 106 mEq/L (ref 96–112)
GFR: 87.05 mL/min (ref >60.00)
GLUCOSE: 119 mg/dL — AB (ref 70–99)
POTASSIUM: 4.2 meq/L (ref 3.5–5.1)
SODIUM: 142 meq/L (ref 135–145)
TOTAL PROTEIN: 7.3 g/dL (ref 6.0–8.3)
Total Bilirubin: 0.3 mg/dL (ref 0.2–1.2)

## 2015-03-31 LAB — CBC WITH DIFFERENTIAL/PLATELET
BASOS ABS: 0 10*3/uL (ref 0.0–0.1)
BASOS PCT: 0.5 % (ref 0.0–3.0)
EOS ABS: 0.1 10*3/uL (ref 0.0–0.7)
Eosinophils Relative: 1.7 % (ref 0.0–5.0)
HCT: 37.3 % (ref 36.0–46.0)
Hemoglobin: 12.2 g/dL (ref 12.0–15.0)
LYMPHS ABS: 1.3 10*3/uL (ref 0.7–4.0)
LYMPHS PCT: 20.3 % (ref 12.0–46.0)
MCHC: 32.6 g/dL (ref 30.0–36.0)
MCV: 79 fl (ref 78.0–100.0)
MONO ABS: 0.4 10*3/uL (ref 0.1–1.0)
Monocytes Relative: 7 % (ref 3.0–12.0)
NEUTROS ABS: 4.3 10*3/uL (ref 1.4–7.7)
NEUTROS PCT: 70.5 % (ref 43.0–77.0)
PLATELETS: 284 10*3/uL (ref 150.0–400.0)
RBC: 4.73 Mil/uL (ref 3.87–5.11)
RDW: 18.3 % — AB (ref 11.5–15.5)
WBC: 6.2 10*3/uL (ref 4.0–10.5)

## 2015-03-31 LAB — MAGNESIUM: MAGNESIUM: 2.2 mg/dL (ref 1.5–2.5)

## 2015-03-31 LAB — HEMOGLOBIN A1C: HEMOGLOBIN A1C: 7.7 % — AB (ref 4.6–6.5)

## 2015-03-31 NOTE — Progress Notes (Signed)
Subjective:    Patient ID: [name], female    DOB: Nov 20, 1940, 75 y.o.   MRN: [id number]  HPI She is here to establish with a new pcp.   Diabetes: She is taking her medication daily as prescribed. She was having hypoglycemia with invokana 300 mg daily, so she decreased it to 150 mg daily and her sugars are sometimes high.  She is compliant with a diabetic diet. She is exercising regularly. She monitors her sugars and they have been variable - 200's on Saturday all day long, Sunday it was in the 200's. She checks her feet daily and denies foot lesions. She is up-to-date with an ophthalmology examination.   Leaky bowel since march:  She saw a neurologist, she feels it was not addressed.  She was referred to GI at that time.  She did have a narrowing in her esophagus.  She had a dilation.  She did not feel her bowel movements were addressed.  She has 3-4 bowel movements a day.  She has fecal incontinence daily.  It started after her knee replacement last year.  She has tried to eat a lot of fiber - cereal.    Poor balanace:  She has fallen three times.  She did not address this with neurology.  She is exercising - has not done in for about a month due to respiratory infection.  She does water exercises.    She has a headache daily.  She was supposed to to be tested for sleep apnea and has not heard about scheduling the test.     She gets dizziness frequently, sometimes associated with a headache.  She takes meclizine daily.    Body cramps:  She has had severe muscle cramping.  They wake her up at night.  She thinks it was from a medication.  She was on quinine in the past.  She takes a muscle relaxer three times a day.  When relaxed they start in the toe and radiate up to the ribs.  She has constant pain in her right ribs - she has had this evaluated and everything was normal.     GERD:  She is taking her medication daily as prescribed.  She denies any GERD symptoms and feels her GERD is  well controlled.    Medications and allergies reviewed with patient and updated if appropriate.  Patient Active Problem List   Diagnosis Date Noted  . OSA (obstructive sleep apnea) 11/20/2014  . Fecal incontinence 08/28/2014  . 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 Falls 08/28/2014  . Neck pain of over 3 months duration 08/28/2014  . H/O total knee replacement 02/20/2014  . Obesity (BMI 30-39.9) 11/12/2013  . Chronic diastolic CHF (congestive heart failure) ([place]) 11/17/2012  . Meniscus, lateral, anterior horn derangement 11/10/2011  . Medial meniscus, posterior horn derangement 11/10/2011  . Osteoarthritis of right knee 11/10/2011  . Vertigo 10/05/2011  . HTN (hypertension), benign 10/05/2011  . Coronary artery disease 08/27/2010  . MUSCLE CRAMPS 12/29/2009  . HEART ATTACK 11/19/2009  . Extrinsic asthma 11/19/2009  . GERD 11/19/2009  . DIABETES, TYPE 2 11/18/2009  . HYPERLIPIDEMIA 11/18/2009    Current Outpatient Prescriptions on File Prior to Visit  Medication Sig Dispense Refill  . albuterol (PROVENTIL HFA;VENTOLIN HFA) 108 (90 BASE) MCG/ACT inhaler Inhale 2 puffs into the lungs every 6 (six) hours as needed. For shortness of breath. 1 Inhaler 0  . albuterol (PROVENTIL) (2.5 MG/3ML) 0.083% nebulizer solution Take 3 mLs (2.5  mg total) by nebulization every 6 (six) hours as needed. For shortness of breath. 360 mL 11  . ALPRAZolam (XANAX) 0.25 MG tablet Take 0.25 mg by mouth 2 (two) times daily as needed.     . baclofen (LIORESAL) 20 MG tablet Take 1 tablet by mouth 2 (two) times daily.    . BELSOMRA 5 MG TABS Take 5 mg by mouth daily.  2  . benzonatate (TESSALON) 100 MG capsule Take 1 capsule (100 mg total) by mouth 3 (three) times daily as needed for cough. 180 capsule 1  . budesonide (PULMICORT) 0.5 MG/2ML nebulizer solution Take 2 mLs (0.5 mg total) by nebulization 2 (two) times daily. 120 mL 11  . Calcium & Magnesium Carbonates (MYLANTA PO) Take 1 tablet by mouth daily  as needed. For indigestion.    . canagliflozin (INVOKANA) 300 MG TABS tablet Take 150 mg by mouth daily before breakfast.     . Cholecalciferol (VITAMIN D) 2000 UNITS tablet Take 2,000 Units by mouth daily.     . clopidogrel (PLAVIX) 75 MG tablet Take 1 tablet (75 mg total) by mouth every morning. 90 tablet 3  . Cyanocobalamin (VITAMIN B-12 IJ) Inject as directed every 30 (thirty) days.    Marland Kitchen diltiazem (CARDIZEM CD) 240 MG 24 hr capsule Take 1 capsule (240 mg total) by mouth daily. 30 capsule 10  . doxazosin (CARDURA) 8 MG tablet Take 1 tablet (8 mg total) by mouth daily. 30 tablet 7  . famotidine (PEPCID) 20 MG tablet Take 20 mg by mouth 2 (two) times daily.    . ferrous sulfate 325 (65 FE) MG tablet Take 1 tablet (325 mg total) by mouth 3 (three) times daily after meals. 30 tablet 0  . formoterol (PERFOROMIST) 20 MCG/2ML nebulizer solution Take 2 mLs (20 mcg total) by nebulization 2 (two) times daily. 120 mL 11  . furosemide (LASIX) 40 MG tablet Take 40 mg by mouth every other day.     Marland Kitchen HYDROmorphone (DILAUDID) 2 MG tablet Take 1 tablet (2 mg total) by mouth every 4 (four) hours as needed for severe pain. 80 tablet 0  . insulin lispro protamine-insulin lispro (HUMALOG 75/25) (75-25) 100 UNIT/ML SUSP 15 in am, 15 in evening    . isosorbide mononitrate (IMDUR) 30 MG 24 hr tablet Take 1 tablet (30 mg total) by mouth daily. 31 tablet 11  . meclizine (ANTIVERT) 25 MG tablet Take 25 mg by mouth as needed for dizziness (dizziness).     . metoprolol succinate (TOPROL-XL) 50 MG 24 hr tablet Take 1 tablet by mouth daily.    . montelukast (SINGULAIR) 5 MG chewable tablet Chew 1 tablet (5 mg total) by mouth at bedtime. 30 tablet 3  . Multiple Vitamin (MULTIVITAMIN WITH MINERALS) TABS Take 1 tablet by mouth daily.    . Nebulizers (COMPRESSOR/NEBULIZER) MISC Use with albuterol 1 each 0  . neomycin-polymyxin b-dexamethasone (MAXITROL) 3.5-10000-0.1 OINT Apply topically as needed    . nitroGLYCERIN (NITROSTAT)  0.4 MG SL tablet Place 0.4 mg under the tongue every 5 (five) minutes x 3 doses as needed. For chest pain.    . potassium chloride SA (K-DUR,KLOR-CON) 20 MEQ tablet Take 20 mEq by mouth 2 (two) times daily.    . prednisoLONE acetate (PRED FORTE) 1 % ophthalmic suspension Place 1 drop into the left eye 4 (four) times daily.  0  . pantoprazole (PROTONIX) 40 MG tablet Take 1 tablet (40 mg total) by mouth daily. 30 tablet 11   No current  facility-administered medications on file prior to visit.    Past Medical History  Diagnosis Date  . Coronary artery disease   . Diabetes mellitus   . Hypertension   . Hyperlipidemia   . Shoulder injury     resolved after shoulder surgery  . Chest pain   . Acute myocardial infarction, unspecified site, episode of care unspecified   . Unspecified asthma(493.90)   . PONV (postoperative nausea and vomiting)   . Anxiety   . GERD (gastroesophageal reflux disease)   . Upper respiratory symptom     02-13-14 "runny nose, mild cough,sniffles" -instructed to inform MD if symptoms worsens  . Schatzki's ring   . Hiatal hernia   . Adenomatous colon polyp     Past Surgical History  Procedure Laterality Date  . Cardiac catheterization  07/23/2009    EF 60%  . Cardiac catheterization  10/11/2008  . Cardiac catheterization  03/01/2007    EF 75-80%  . Coronary artery bypass graft      SEVERELY DISEASED SAPHENOUS VEIN GRAFT TO THE RIGHT CORONARY ARTERY BUT WITH FAIRLY WELL PRESERVED FLOW TO THE DISTAL RIGHT CORONARY ARTERY FROM THE NATIVE CIRCULATION-RESTART  CATH IN JUNE 2000, REVEALS MILD/MODERATE  CAD WITH GOOD FLOW DOWN HER LAD  . US echocardiography  03/08/2008    EF 55-60%  . Cardiovascular stress test  11/15/2007    EF 60%  . Rotator cuff repair      right and left  . Tumor removed kidney    . Tonsillectomy      age 50  . Abdominal hysterectomy    . Appendectomy      came out with Hysterectomy  . Eye surgery      bilateral cataract surgery with lens  implant  . Total knee arthroplasty Right 02/20/2014    Procedure: RIGHT TOTAL KNEE ARTHROPLASTY;  Surgeon: Tobi Bastos, MD;  Location: [facility];  Service: Orthopedics;  Laterality: Right;  . Colonoscopy    . Esophagogastroduodenoscopy    . Esophageal dilation    . Lense removal Left     Social History   Social History  . Marital Status: Widowed    Spouse Name: N/A  . Number of Children: 4  . Years of Education: Doctorate   Occupational History  . Retired    Social History Main Topics  . Smoking status: Former Smoker -- 0.50 packs/day for 21 years    Types: Cigarettes    Start date: 02/09/1955    Quit date: 02/09/1976  . Smokeless tobacco: Never Used  . Alcohol Use: No  . Drug Use: No  . Sexual Activity: Not on file   Other Topics Concern  . Not on file   Social History Narrative   Lives alone.   Caffeine use: Drinks 1 cup coffee/day      Originally from [place]. Previously has lived in [place]. Prior travel to [place], [place], [place], [place], [place], [place], [place], & [place]. Previously worked in [employer] She has a dog currently. No bird, mold, or hot tub exposure. She also pastors a church.     Family History  Problem Relation Age of Onset  . Emphysema Paternal Uncle   . Heart disease Maternal Grandfather   . Heart failure Maternal Grandfather   . Esophageal cancer Brother 50    she said he was born with it  . Heart attack Father   . Stomach cancer Father   . Neuropathy Neg Hx   . Multiple sclerosis Neg  Hx   . Rheum arthritis Sister   . Emphysema Paternal Aunt   . Colon cancer Neg Hx     Review of Systems  Constitutional: Negative for fever and chills.  HENT: Negative for congestion and sore throat.   Eyes: Negative for visual disturbance.  Respiratory: Negative for cough, shortness of breath and wheezing.   Cardiovascular: Negative for chest pain, palpitations and leg swelling.  Gastrointestinal: Negative for abdominal pain, diarrhea, constipation and  blood in stool.       GERD often  Genitourinary: Negative for dysuria.  Musculoskeletal: Positive for myalgias (muscle cramps).  Neurological: Positive for dizziness, light-headedness and headaches.       Poor balance  Psychiatric/Behavioral: Negative for sleep disturbance.       Objective:   Filed Vitals:   03/31/15 1538  BP: 144/78  Temp: 98.7 F (37.1 C)  Resp: 16   Filed Weights   03/31/15 1538  Weight: 189 lb (85.73 kg)   Body mass index is 33.49 kg/(m^2).   Physical Exam Constitutional: Appears well-developed and well-nourished. No distress. Eyes: conjunctiva normal, no icterus Mouth: oral mucosa moist  Neck: Neck supple. No tracheal deviation present. No thyromegaly present.  No carotid bruit. No cervical adenopathy.   Cardiovascular: Normal rate, regular rhythm and normal heart sounds.   No murmur heard.  No edema Pulmonary/Chest: Effort normal and breath sounds normal. No respiratory distress. No wheezes. Abd: soft, nontender, nondistended Msk: no back pain with palpation Ext: no edema Vascular; normal DP pulses b/l Skin: warm, dry, no rashes Psych; normal mood and affect       Assessment & Plan:    See Problem List for Assessment and Plan of chronic medical problems.  Blood work ordered  Follow up in 2 weeks

## 2015-03-31 NOTE — Assessment & Plan Note (Signed)
BP slightly elevated here today Follow up in 2 weeks to recheck - will adjust meds if needed Check cmp

## 2015-03-31 NOTE — Patient Instructions (Addendum)
Try taking a fiber supplement called konsyl - it comes in a powder or pill - take as directed.     We have reviewed your prior records including labs and tests today.  Test(s) ordered today. Your results will be released to [facility] (or called to you) after review, usually within 72hours after test completion. If any changes need to be made, you will be notified at that same time.   Medications reviewed and updated. No changes recommended at this time.   A referral was ordered for endocrine.  Please schedule followup in 2 weeks

## 2015-03-31 NOTE — Assessment & Plan Note (Signed)
Controlled continue daily med

## 2015-03-31 NOTE — Assessment & Plan Note (Signed)
Last a1c per pt was just above 7, but sugars at home not controlled Will refer to endo Check a1c

## 2015-03-31 NOTE — Progress Notes (Signed)
Pre visit review using our clinic review tool, if applicable. No additional management support is needed unless otherwise documented below in the visit note. 

## 2015-03-31 NOTE — Assessment & Plan Note (Signed)
Check blood work  Follow up in 2 weeks

## 2015-03-31 NOTE — Assessment & Plan Note (Signed)
Trial of high fiber supplement Check blood work  Follow up in 2 weeks

## 2015-03-31 NOTE — Assessment & Plan Note (Signed)
History of B12 def - was on B12 injections Check B12 level - may need IM supplementation

## 2015-04-01 ENCOUNTER — Telehealth: Payer: Self-pay

## 2015-04-01 DIAGNOSIS — G4761 Periodic limb movement disorder: Secondary | ICD-10-CM

## 2015-04-01 DIAGNOSIS — G4734 Idiopathic sleep related nonobstructive alveolar hypoventilation: Secondary | ICD-10-CM

## 2015-04-01 DIAGNOSIS — G4733 Obstructive sleep apnea (adult) (pediatric): Secondary | ICD-10-CM

## 2015-04-01 DIAGNOSIS — G4720 Circadian rhythm sleep disorder, unspecified type: Secondary | ICD-10-CM

## 2015-04-01 LAB — VITAMIN B12: Vitamin B-12: 253 pg/mL (ref 211–911)

## 2015-04-01 LAB — TSH: TSH: 0.58 u[IU]/mL (ref 0.35–4.50)

## 2015-04-01 LAB — CK TOTAL AND CKMB (NOT AT ARMC)
CK, MB: 1.7 ng/mL (ref 0.0–5.0)
Relative Index: 1.8 (ref 0.0–4.0)
Total CK: 95 U/L (ref 7–177)

## 2015-04-01 NOTE — Telephone Encounter (Signed)
I spoke to patient and she states that she was never called to get titration study. Can we get her scheduled for 2nd sleep study?

## 2015-04-01 NOTE — Telephone Encounter (Signed)
Order was in, unsure why it was not visible?? I put a new one in.

## 2015-04-01 NOTE — Telephone Encounter (Signed)
I don't see any order for this one.  Could you please place the order needed?

## 2015-04-01 NOTE — Telephone Encounter (Signed)
HAD SLEEP STUDY DONE ON 10/17/2014 NEVER RECEIVED RESULTS. PLEASE CALL WITH RESULTS AND WHAT HER NEXT STEP TO DO IS.  BEST PHONE NUMBER [phone]

## 2015-04-04 ENCOUNTER — Telehealth: Payer: Self-pay | Admitting: Emergency Medicine

## 2015-04-04 NOTE — Telephone Encounter (Signed)
We can start at her 3/8 appt for convenience then do monthly.

## 2015-04-04 NOTE — Telephone Encounter (Signed)
LVM informing pt

## 2015-04-04 NOTE — Telephone Encounter (Signed)
Spoke with pt to inform. Pt said that she prefers to get the B12 injections. When should she restart these injections? I will call her back and schedule an appt.

## 2015-04-04 NOTE — Telephone Encounter (Signed)
-----   Message from Binnie Rail, MD sent at 04/03/2015  8:19 PM EST ----- Thyroid function, muscle enzymes, kidney function, liver tests, routine blood counts, and magnesium are normal.  Her sugars are slightly elevated - her a1c is 7.7.  endocrine will help adjust her medication.  B12 level is on the low side - we can do oral supplementation or monthly B12 injections.

## 2015-04-14 ENCOUNTER — Ambulatory Visit (INDEPENDENT_AMBULATORY_CARE_PROVIDER_SITE_OTHER): Payer: Medicare Other | Admitting: Neurology

## 2015-04-14 DIAGNOSIS — G4733 Obstructive sleep apnea (adult) (pediatric): Secondary | ICD-10-CM | POA: Diagnosis not present

## 2015-04-14 DIAGNOSIS — G4761 Periodic limb movement disorder: Secondary | ICD-10-CM

## 2015-04-14 DIAGNOSIS — G4720 Circadian rhythm sleep disorder, unspecified type: Secondary | ICD-10-CM

## 2015-04-15 NOTE — Sleep Study (Signed)
Please see the scanned sleep study interpretation located in the Procedure tab within the Chart Review section. 

## 2015-04-16 ENCOUNTER — Encounter: Payer: Self-pay | Admitting: Internal Medicine

## 2015-04-16 ENCOUNTER — Telehealth: Payer: Self-pay | Admitting: Neurology

## 2015-04-16 ENCOUNTER — Ambulatory Visit (INDEPENDENT_AMBULATORY_CARE_PROVIDER_SITE_OTHER): Payer: Medicare Other | Admitting: Internal Medicine

## 2015-04-16 VITALS — BP 132/74 | Temp 98.8°F | Resp 16 | Wt 187.0 lb

## 2015-04-16 DIAGNOSIS — E538 Deficiency of other specified B group vitamins: Secondary | ICD-10-CM

## 2015-04-16 DIAGNOSIS — G4733 Obstructive sleep apnea (adult) (pediatric): Secondary | ICD-10-CM

## 2015-04-16 DIAGNOSIS — E119 Type 2 diabetes mellitus without complications: Secondary | ICD-10-CM

## 2015-04-16 DIAGNOSIS — Z794 Long term (current) use of insulin: Secondary | ICD-10-CM | POA: Diagnosis not present

## 2015-04-16 DIAGNOSIS — R159 Full incontinence of feces: Secondary | ICD-10-CM

## 2015-04-16 MED ORDER — CYANOCOBALAMIN 1000 MCG/ML IJ SOLN
1000.0000 ug | INTRAMUSCULAR | Status: DC
  Administered 2015-04-16 – 2016-03-26 (×3): 1000 ug via INTRAMUSCULAR

## 2015-04-16 NOTE — Progress Notes (Signed)
Subjective:    Patient ID: [name], female    DOB: [birth year], 75 y.o.   MRN: [id number]  HPI She is here for follow up.  B12 - She has tried oral supplementation and it has not worked in the past.  She would like to go back on B12 injections.   Diabetes: She is taking her medication daily as prescribed. She has been referred to endocrine and has an appointment this month.  She is compliant with a diabetic diet. She is exercising regularly. She monitors her sugars and they continue to be variable - lows to very highs.   Her recent a1c was 7.7.  OSA:  She will have the cpap fit for her.   She has already had the first sleep study.    Fecal incontinence: She has tried the fiber supplementation and it has helped.  Her colonoscopy was last year and was normal.  She is still having 3-4 bowel movements a day, but the stool is slightly more solid.  She still has bowel incontinence.  She never knows when it is going to happen.  Her quality of like is significantly affected by this.   Medications and allergies reviewed with patient and updated if appropriate.  Patient Active Problem List   Diagnosis Date Noted  . Type 2 diabetes mellitus without complication, with long-term current use of insulin ([place]) 03/31/2015  . Vitamin B12 deficiency 03/31/2015  . OSA (obstructive sleep apnea) 11/20/2014  . Fecal incontinence 08/28/2014  . 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 Falls 08/28/2014  . Neck pain of over 3 months duration 08/28/2014  . H/O total knee replacement 02/20/2014  . Obesity (BMI 30-39.9) 11/12/2013  . Chronic diastolic CHF (congestive heart failure) ([place]) 11/17/2012  . Meniscus, lateral, anterior horn derangement 11/10/2011  . Medial meniscus, posterior horn derangement 11/10/2011  . Osteoarthritis of right knee 11/10/2011  . Vertigo 10/05/2011  . HTN (hypertension), benign 10/05/2011  . Coronary artery disease 08/27/2010  . MUSCLE CRAMPS 12/29/2009  . HEART ATTACK  11/19/2009  . Extrinsic asthma 11/19/2009  . GERD 11/19/2009  . HYPERLIPIDEMIA 11/18/2009    Current Outpatient Prescriptions on File Prior to Visit  Medication Sig Dispense Refill  . albuterol (PROVENTIL HFA;VENTOLIN HFA) 108 (90 BASE) MCG/ACT inhaler Inhale 2 puffs into the lungs every 6 (six) hours as needed. For shortness of breath. 1 Inhaler 0  . albuterol (PROVENTIL) (2.5 MG/3ML) 0.083% nebulizer solution Take 3 mLs (2.5 mg total) by nebulization every 6 (six) hours as needed. For shortness of breath. 360 mL 11  . ALPRAZolam (XANAX) 0.25 MG tablet Take 0.25 mg by mouth 2 (two) times daily as needed.     . baclofen (LIORESAL) 20 MG tablet Take 1 tablet by mouth 2 (two) times daily.    . BELSOMRA 5 MG TABS Take 5 mg by mouth daily.  2  . benzonatate (TESSALON) 100 MG capsule Take 1 capsule (100 mg total) by mouth 3 (three) times daily as needed for cough. 180 capsule 1  . budesonide (PULMICORT) 0.5 MG/2ML nebulizer solution Take 2 mLs (0.5 mg total) by nebulization 2 (two) times daily. 120 mL 11  . Calcium & Magnesium Carbonates (MYLANTA PO) Take 1 tablet by mouth daily as needed. For indigestion.    . canagliflozin (INVOKANA) 300 MG TABS tablet Take 150 mg by mouth daily before breakfast.     . Cholecalciferol (VITAMIN D) 2000 UNITS tablet Take 2,000 Units by mouth daily.     Marland Kitchen  clopidogrel (PLAVIX) 75 MG tablet Take 1 tablet (75 mg total) by mouth every morning. 90 tablet 3  . Cyanocobalamin (VITAMIN B-12 IJ) Inject as directed every 30 (thirty) days.    Marland Kitchen diltiazem (CARDIZEM CD) 240 MG 24 hr capsule Take 1 capsule (240 mg total) by mouth daily. 30 capsule 10  . doxazosin (CARDURA) 8 MG tablet Take 1 tablet (8 mg total) by mouth daily. 30 tablet 7  . famotidine (PEPCID) 20 MG tablet Take 20 mg by mouth 2 (two) times daily.    . ferrous sulfate 325 (65 FE) MG tablet Take 1 tablet (325 mg total) by mouth 3 (three) times daily after meals. 30 tablet 0  . formoterol (PERFOROMIST) 20 MCG/2ML  nebulizer solution Take 2 mLs (20 mcg total) by nebulization 2 (two) times daily. 120 mL 11  . furosemide (LASIX) 40 MG tablet Take 40 mg by mouth every other day.     Marland Kitchen HYDROmorphone (DILAUDID) 2 MG tablet Take 1 tablet (2 mg total) by mouth every 4 (four) hours as needed for severe pain. 80 tablet 0  . insulin lispro protamine-insulin lispro (HUMALOG 75/25) (75-25) 100 UNIT/ML SUSP 15 in am, 15 in evening    . isosorbide mononitrate (IMDUR) 30 MG 24 hr tablet Take 1 tablet (30 mg total) by mouth daily. 31 tablet 11  . meclizine (ANTIVERT) 25 MG tablet Take 25 mg by mouth as needed for dizziness (dizziness).     . metoprolol succinate (TOPROL-XL) 50 MG 24 hr tablet Take 1 tablet by mouth daily.    . montelukast (SINGULAIR) 5 MG chewable tablet Chew 1 tablet (5 mg total) by mouth at bedtime. 30 tablet 3  . Multiple Vitamin (MULTIVITAMIN WITH MINERALS) TABS Take 1 tablet by mouth daily.    . Nebulizers (COMPRESSOR/NEBULIZER) MISC Use with albuterol 1 each 0  . neomycin-polymyxin b-dexamethasone (MAXITROL) 3.5-10000-0.1 OINT Apply topically as needed    . nitroGLYCERIN (NITROSTAT) 0.4 MG SL tablet Place 0.4 mg under the tongue every 5 (five) minutes x 3 doses as needed. For chest pain.    . potassium chloride SA (K-DUR,KLOR-CON) 20 MEQ tablet Take 20 mEq by mouth 2 (two) times daily.    . prednisoLONE acetate (PRED FORTE) 1 % ophthalmic suspension Place 1 drop into the left eye 4 (four) times daily.  0  . pantoprazole (PROTONIX) 40 MG tablet Take 1 tablet (40 mg total) by mouth daily. 30 tablet 11   No current facility-administered medications on file prior to visit.    Past Medical History  Diagnosis Date  . Coronary artery disease   . Diabetes mellitus   . Hypertension   . Hyperlipidemia   . Shoulder injury     resolved after shoulder surgery  . Chest pain   . Acute myocardial infarction, unspecified site, episode of care unspecified   . Unspecified asthma(493.90)   . PONV  (postoperative nausea and vomiting)   . Anxiety   . GERD (gastroesophageal reflux disease)   . Upper respiratory symptom     02-13-14 "runny nose, mild cough,sniffles" -instructed to inform MD if symptoms worsens  . Schatzki's ring   . Hiatal hernia   . Adenomatous colon polyp     Past Surgical History  Procedure Laterality Date  . Cardiac catheterization  07/23/2009    EF 60%  . Cardiac catheterization  10/11/2008  . Cardiac catheterization  03/01/2007    EF 75-80%  . Coronary artery bypass graft      SEVERELY DISEASED SAPHENOUS  VEIN GRAFT TO THE RIGHT CORONARY ARTERY BUT WITH FAIRLY WELL PRESERVED FLOW TO THE DISTAL RIGHT CORONARY ARTERY FROM THE NATIVE CIRCULATION-RESTART  CATH IN JUNE 2000, REVEALS MILD/MODERATE  CAD WITH GOOD FLOW DOWN HER LAD  . US echocardiography  03/08/2008    EF 55-60%  . Cardiovascular stress test  11/15/2007    EF 60%  . Rotator cuff repair      right and left  . Tumor removed kidney    . Tonsillectomy      age 15  . Abdominal hysterectomy    . Appendectomy      came out with Hysterectomy  . Eye surgery      bilateral cataract surgery with lens implant  . Total knee arthroplasty Right 02/20/2014    Procedure: RIGHT TOTAL KNEE ARTHROPLASTY;  Surgeon: Tobi Bastos, MD;  Location: [facility];  Service: Orthopedics;  Laterality: Right;  . Colonoscopy    . Esophagogastroduodenoscopy    . Esophageal dilation    . Lense removal Left     Social History   Social History  . Marital Status: Widowed    Spouse Name: N/A  . Number of Children: 4  . Years of Education: Doctorate   Occupational History  . Retired    Social History Main Topics  . Smoking status: Former Smoker -- 0.50 packs/day for 21 years    Types: Cigarettes    Start date: 02/09/1955    Quit date: 02/09/1976  . Smokeless tobacco: Never Used  . Alcohol Use: No  . Drug Use: No  . Sexual Activity: Not Asked   Other Topics Concern  . None   Social History Narrative   Lives alone.    Caffeine use: Drinks 1 cup coffee/day      Originally from [place]. Previously has lived in [place]. Prior travel to [place], [place], [place], [place], [place], [place], [place], & [place]. Previously worked in [employer] She has a dog currently. No bird, mold, or hot tub exposure. She also pastors a church.     Family History  Problem Relation Age of Onset  . Emphysema Paternal Uncle   . Heart disease Maternal Grandfather   . Heart failure Maternal Grandfather   . Esophageal cancer Brother 42    she said he was born with it  . Heart attack Father   . Stomach cancer Father   . Neuropathy Neg Hx   . Multiple sclerosis Neg Hx   . Rheum arthritis Sister   . Emphysema Paternal Aunt   . Colon cancer Neg Hx     Review of Systems  Constitutional: Negative for fever.  Respiratory: Negative for cough, shortness of breath and wheezing.   Cardiovascular: Negative for chest pain, palpitations and leg swelling.  Gastrointestinal:       Bowel incontinence  Neurological: Positive for dizziness, light-headedness and headaches.       Objective:   Filed Vitals:   04/16/15 1441  BP: 132/74  Temp: 98.8 F (37.1 C)  Resp: 16   Filed Weights   04/16/15 1441  Weight: 187 lb (84.823 kg)   Body mass index is 33.13 kg/(m^2).   Physical Exam Constitutional: Appears well-developed and well-nourished. No distress.  Neck: Neck supple. No tracheal deviation present. No thyromegaly present.  No carotid bruit. No cervical adenopathy.   Cardiovascular: Normal rate, regular rhythm and normal heart sounds.   No murmur heard.  No edema Pulmonary/Chest: Effort normal and breath sounds normal. No respiratory distress. No wheezes.  Assessment & Plan:   See Problem List for Assessment and Plan of chronic medical problems.  F/u in 3 months

## 2015-04-16 NOTE — Patient Instructions (Addendum)
Try increasing your fiber supplementation  You received a B12 injection today and will come here monthly for injections.  A referral to colorectal surgery was ordered.   Follow up in 3 months

## 2015-04-16 NOTE — Assessment & Plan Note (Signed)
Has not responded to oral supplementation Start B12 injection today  Monthly injections

## 2015-04-16 NOTE — Assessment & Plan Note (Signed)
Slight improvement with increased fiber - she will try increasing her fiber more Has already seen GI Will refer to colorectal

## 2015-04-16 NOTE — Progress Notes (Signed)
Pre visit review using our clinic review tool, if applicable. No additional management support is needed unless otherwise documented below in the visit note. 

## 2015-04-16 NOTE — Assessment & Plan Note (Signed)
Sugars variable Will see endocrine this month

## 2015-04-16 NOTE — Telephone Encounter (Signed)
LM for patient to call back.

## 2015-04-16 NOTE — Telephone Encounter (Signed)
Dr. Cathren Laine patient, seen by me on 10/17/14, PSG on 10/31/14, CPAP study on 04/14/15, Ins: [employer] medicare, PCP: Dr. Karlton Lemon.  Please call and inform patient that I have entered an order for treatment with positive airway pressure (PAP) treatment of obstructive sleep apnea (OSA). She did well during the latest sleep study with CPAP. We will, therefore, arrange for a machine for home use through a DME (durable medical equipment) company of Her choice; and I will see the patient back in follow-up in about 8-10 weeks. Please also explain to the patient that I will be looking out for compliance data, which can be downloaded from the machine (stored on an SD card, that is inserted in the machine) or via remote access through a modem, that is built into the machine. At the time of the followup appointment we will discuss sleep study results and how it is going with PAP treatment at home. Please advise patient to bring Her machine at the time of the first FU visit, even though this is cumbersome. Bringing the machine for every visit after that will likely not be needed, but often helps for the first visit to troubleshoot if needed. Please re-enforce the importance of compliance with treatment and the need for [place] to monitor compliance data - often an insurance requirement and actually good feedback for the patient as far as how they are doing.  Also remind patient, that any interim PAP machine or mask issues should be first addressed with the DME company, as they can often help better with technical and mask fit issues. Please ask if patient has a preference regarding DME company.  Please also make sure, the patient has a follow-up appointment with me in about 8-10 weeks from the setup date, thanks.  Once you have spoken to the patient - and faxed/routed report to PCP and referring MD (if other than PCP), you can close this encounter, thanks,   Star Age, MD, PhD [facility] ([place])

## 2015-04-17 NOTE — Telephone Encounter (Signed)
I spoke to patient and she is aware of results and recommendations. She is willing to proceed with treatment. Referral will be sent to [facility]. Patient was able to make f/u appt.

## 2015-04-22 ENCOUNTER — Encounter: Payer: Self-pay | Admitting: Endocrinology

## 2015-04-22 ENCOUNTER — Ambulatory Visit (INDEPENDENT_AMBULATORY_CARE_PROVIDER_SITE_OTHER): Payer: Medicare Other | Admitting: Endocrinology

## 2015-04-22 VITALS — BP 138/78 | HR 76 | Temp 98.7°F | Resp 20 | Ht 62.5 in | Wt 187.5 lb

## 2015-04-22 DIAGNOSIS — Z794 Long term (current) use of insulin: Secondary | ICD-10-CM | POA: Diagnosis not present

## 2015-04-22 DIAGNOSIS — E119 Type 2 diabetes mellitus without complications: Secondary | ICD-10-CM | POA: Diagnosis not present

## 2015-04-22 LAB — MICROALBUMIN / CREATININE URINE RATIO
Creatinine,U: 66.1 mg/dL
Microalb Creat Ratio: 1.4 mg/g (ref 0.0–30.0)
Microalb, Ur: 0.9 mg/dL (ref 0.0–1.9)

## 2015-04-22 NOTE — Progress Notes (Signed)
Subjective:    Patient ID: [name], female    DOB: May 11, 1940, 75 y.o.   MRN: [id number]  HPI pt states DM was dx'ed in 1994; she has mild if any neuropathy of the lower extremities, but she has associated CAD; she has been on insulin since dx; pt says her diet is good, but exercise is limited by OA; she has never had GDM, pancreatitis, or DKA.  last episode of severe hypoglycemia was in 2015.  she brings a record of her cbg's which i have reviewed today.  It varies from 84 (fasting for her church) to 300's.  There is no trend throughout the day.  She did not tolerate [place] or metformin (nausea).   Past Medical History  Diagnosis Date  . Coronary artery disease   . Diabetes mellitus   . Hypertension   . Hyperlipidemia   . Shoulder injury     resolved after shoulder surgery  . Chest pain   . Acute myocardial infarction, unspecified site, episode of care unspecified   . Unspecified asthma(493.90)   . PONV (postoperative nausea and vomiting)   . Anxiety   . GERD (gastroesophageal reflux disease)   . Upper respiratory symptom     02-13-14 "runny nose, mild cough,sniffles" -instructed to inform MD if symptoms worsens  . Schatzki's ring   . Hiatal hernia   . Adenomatous colon polyp     Past Surgical History  Procedure Laterality Date  . Cardiac catheterization  07/23/2009    EF 60%  . Cardiac catheterization  10/11/2008  . Cardiac catheterization  03/01/2007    EF 75-80%  . Coronary artery bypass graft      SEVERELY DISEASED SAPHENOUS VEIN GRAFT TO THE RIGHT CORONARY ARTERY BUT WITH FAIRLY WELL PRESERVED FLOW TO THE DISTAL RIGHT CORONARY ARTERY FROM THE NATIVE CIRCULATION-RESTART  CATH IN JUNE 2000, REVEALS MILD/MODERATE  CAD WITH GOOD FLOW DOWN HER LAD  . US echocardiography  03/08/2008    EF 55-60%  . Cardiovascular stress test  11/15/2007    EF 60%  . Rotator cuff repair      right and left  . Tumor removed kidney    . Tonsillectomy      age 80  . Abdominal hysterectomy      . Appendectomy      came out with Hysterectomy  . Eye surgery      bilateral cataract surgery with lens implant  . Total knee arthroplasty Right 02/20/2014    Procedure: RIGHT TOTAL KNEE ARTHROPLASTY;  Surgeon: Tobi Bastos, MD;  Location: [facility];  Service: Orthopedics;  Laterality: Right;  . Colonoscopy    . Esophagogastroduodenoscopy    . Esophageal dilation    . Lense removal Left     Social History   Social History  . Marital Status: Widowed    Spouse Name: N/A  . Number of Children: 4  . Years of Education: Doctorate   Occupational History  . Retired    Social History Main Topics  . Smoking status: Former Smoker -- 0.50 packs/day for 21 years    Types: Cigarettes    Start date: 02/09/1955    Quit date: 02/09/1976  . Smokeless tobacco: Never Used  . Alcohol Use: No  . Drug Use: No  . Sexual Activity: Not on file   Other Topics Concern  . Not on file   Social History Narrative   Lives alone.   Caffeine use: Drinks 1 cup coffee/day  Originally from [facility]. Previously has lived in [place]. Prior travel to [place], [place], [place], [place], [place], [place], [place], & [place]. Previously worked in [employer] She has a dog currently. No bird, mold, or hot tub exposure. She also pastors a church.     Current Outpatient Prescriptions on File Prior to Visit  Medication Sig Dispense Refill  . albuterol (PROVENTIL HFA;VENTOLIN HFA) 108 (90 BASE) MCG/ACT inhaler Inhale 2 puffs into the lungs every 6 (six) hours as needed. For shortness of breath. 1 Inhaler 0  . albuterol (PROVENTIL) (2.5 MG/3ML) 0.083% nebulizer solution Take 3 mLs (2.5 mg total) by nebulization every 6 (six) hours as needed. For shortness of breath. 360 mL 11  . ALPRAZolam (XANAX) 0.25 MG tablet Take 0.25 mg by mouth 2 (two) times daily as needed.     . baclofen (LIORESAL) 20 MG tablet Take 1 tablet by mouth 2 (two) times daily.    . BELSOMRA 5 MG TABS Take 5 mg by mouth daily.  2  . budesonide  (PULMICORT) 0.5 MG/2ML nebulizer solution Take 2 mLs (0.5 mg total) by nebulization 2 (two) times daily. 120 mL 11  . Calcium & Magnesium Carbonates (MYLANTA PO) Take 1 tablet by mouth daily as needed. For indigestion.    . canagliflozin (INVOKANA) 300 MG TABS tablet Take 150 mg by mouth daily before breakfast.     . Cholecalciferol (VITAMIN D) 2000 UNITS tablet Take 2,000 Units by mouth daily.     . clopidogrel (PLAVIX) 75 MG tablet Take 1 tablet (75 mg total) by mouth every morning. 90 tablet 3  . Cyanocobalamin (VITAMIN B-12 IJ) Inject as directed every 30 (thirty) days.    Marland Kitchen diltiazem (CARDIZEM CD) 240 MG 24 hr capsule Take 1 capsule (240 mg total) by mouth daily. 30 capsule 10  . doxazosin (CARDURA) 8 MG tablet Take 1 tablet (8 mg total) by mouth daily. 30 tablet 7  . famotidine (PEPCID) 20 MG tablet Take 20 mg by mouth 2 (two) times daily.    . ferrous sulfate 325 (65 FE) MG tablet Take 1 tablet (325 mg total) by mouth 3 (three) times daily after meals. 30 tablet 0  . formoterol (PERFOROMIST) 20 MCG/2ML nebulizer solution Take 2 mLs (20 mcg total) by nebulization 2 (two) times daily. 120 mL 11  . furosemide (LASIX) 40 MG tablet Take 40 mg by mouth every other day.     Marland Kitchen HYDROmorphone (DILAUDID) 2 MG tablet Take 1 tablet (2 mg total) by mouth every 4 (four) hours as needed for severe pain. 80 tablet 0  . insulin lispro protamine-insulin lispro (HUMALOG 75/25) (75-25) 100 UNIT/ML SUSP 15 in am, 15 in evening    . isosorbide mononitrate (IMDUR) 30 MG 24 hr tablet Take 1 tablet (30 mg total) by mouth daily. 31 tablet 11  . meclizine (ANTIVERT) 25 MG tablet Take 25 mg by mouth as needed for dizziness (dizziness).     . metoprolol succinate (TOPROL-XL) 50 MG 24 hr tablet Take 1 tablet by mouth daily.    . montelukast (SINGULAIR) 5 MG chewable tablet Chew 1 tablet (5 mg total) by mouth at bedtime. 30 tablet 3  . Multiple Vitamin (MULTIVITAMIN WITH MINERALS) TABS Take 1 tablet by mouth daily.    .  Nebulizers (COMPRESSOR/NEBULIZER) MISC Use with albuterol 1 each 0  . neomycin-polymyxin b-dexamethasone (MAXITROL) 3.5-10000-0.1 OINT Apply topically as needed    . nitroGLYCERIN (NITROSTAT) 0.4 MG SL tablet Place 0.4 mg under the tongue every 5 (  five) minutes x 3 doses as needed. For chest pain.    . potassium chloride SA (K-DUR,KLOR-CON) 20 MEQ tablet Take 20 mEq by mouth 2 (two) times daily.    . prednisoLONE acetate (PRED FORTE) 1 % ophthalmic suspension Place 1 drop into the left eye 4 (four) times daily.  0  . pantoprazole (PROTONIX) 40 MG tablet Take 1 tablet (40 mg total) by mouth daily. 30 tablet 11   Current Facility-Administered Medications on File Prior to Visit  Medication Dose Route Frequency Provider Last Rate Last Dose  . cyanocobalamin ((VITAMIN B-12)) injection 1,000 mcg  1,000 mcg Intramuscular Q30 days Binnie Rail, MD   1,000 mcg at 04/16/15 1520    Allergies  Allergen Reactions  . Amlodipine   . Banana Nausea And Vomiting    Stomach pumped  . Co Q10 [Coenzyme Q10]     Body cramps  . Codeine     Hallucinate, loose identity and don't know who I am  . Morphine Other (See Comments)    Can not function, it immobilizes me   . Sulfonamide Derivatives Swelling    Family History  Problem Relation Age of Onset  . Emphysema Paternal Uncle   . Heart disease Maternal Grandfather   . Heart failure Maternal Grandfather   . Esophageal cancer Brother 31    she said he was born with it  . Heart attack Father   . Stomach cancer Father   . Neuropathy Neg Hx   . Multiple sclerosis Neg Hx   . Rheum arthritis Sister   . Emphysema Paternal Aunt   . Colon cancer Neg Hx   . Diabetes Mother   . Diabetes Maternal Grandfather     BP 138/78 mmHg  Pulse 76  Temp(Src) 98.7 F (37.1 C) (Oral)  Resp 20  Ht 5' 2.5" (1.588 m)  Wt 187 lb 8 oz (85.049 kg)  BMI 33.73 kg/m2  SpO2 98%  Review of Systems denies blurry vision, chest pain, n/v, urinary frequency, excessive  diaphoresis, depression, and easy bruising.  She has lost weight, due to her efforts.  She has chronic intermittent headache, cold intolerance, rhinorrhea, and leg cramps.  She attributes sob to asthma.      Objective:   Physical Exam VS: see vs page GEN: no distress HEAD: head: no deformity eyes: no periorbital swelling, no proptosis external nose and ears are normal mouth: no lesion seen NECK: supple, thyroid is not enlarged CHEST WALL: no deformity LUNGS:  Clear to auscultation CV: reg rate and rhythm, no murmur ABD: abdomen is soft, nontender.  no hepatosplenomegaly.  not distended.  no hernia MUSCULOSKELETAL: muscle bulk and strength are grossly normal.  no obvious joint swelling.  gait is normal and steady EXTEMITIES: no deformity.  no ulcer on the feet.  feet are of normal color and temp.  no edema PULSES: dorsalis pedis intact bilat.  no carotid bruit NEURO:  cn 2-12 grossly intact.   readily moves all 4's.  sensation is intact to touch on the feet SKIN:  Normal texture and temperature.  No rash or suspicious lesion is visible.   NODES:  None palpable at the neck PSYCH: alert, well-oriented.  Does not appear anxious nor depressed.   Lab Results  Component Value Date   HGBA1C 7.7* 03/31/2015   Lab Results  Component Value Date   CREATININE 0.82 03/31/2015   BUN 18 03/31/2015   NA 142 03/31/2015   K 4.2 03/31/2015   CL 106 03/31/2015  CO2 31 03/31/2015   I have reviewed outside records, and summarized: Pt was noted to have elevated a1c, and referred here.     Assessment & Plan:  DM: this is the best control this pt should aim for, given this regimen, which does match insulin to her changing needs throughout the day Nausea: this limits oral rx options, so we decided not to try to taper off insulin. CAD: in this context, she should avoid hypoglycemia.   Patient is advised the following: Patient Instructions  good diet and exercise significantly improve the control  of your diabetes.  please let me know if you wish to be referred to a dietician.  high blood sugar is very risky to your health.  you should see an eye doctor and dentist every year.  It is very important to get all recommended vaccinations.  controlling your blood pressure and cholesterol drastically reduces the damage diabetes does to your body.  Those who smoke should quit.  please discuss these with your doctor.  check your blood sugar twice a day.  vary the time of day when you check, between before the 3 meals, and at bedtime.  also check if you have symptoms of your blood sugar being too high or too low.  please keep a record of the readings and bring it to your next appointment here (or you can bring the meter itself).  You can write it on any piece of paper.  please call us sooner if your blood sugar goes below 70, or if you have a lot of readings over 200. Please continue the same insulin and invokana. Please come back for a follow-up appointment in 2 months.

## 2015-04-22 NOTE — Progress Notes (Signed)
Pre visit review using our clinic review tool, if applicable. No additional management support is needed unless otherwise documented below in the visit note. 

## 2015-04-22 NOTE — Patient Instructions (Signed)
good diet and exercise significantly improve the control of your diabetes.  please let me know if you wish to be referred to a dietician.  high blood sugar is very risky to your health.  you should see an eye doctor and dentist every year.  It is very important to get all recommended vaccinations.  controlling your blood pressure and cholesterol drastically reduces the damage diabetes does to your body.  Those who smoke should quit.  please discuss these with your doctor.  check your blood sugar twice a day.  vary the time of day when you check, between before the 3 meals, and at bedtime.  also check if you have symptoms of your blood sugar being too high or too low.  please keep a record of the readings and bring it to your next appointment here (or you can bring the meter itself).  You can write it on any piece of paper.  please call us sooner if your blood sugar goes below 70, or if you have a lot of readings over 200. Please continue the same insulin and invokana. Please come back for a follow-up appointment in 2 months.

## 2015-05-07 ENCOUNTER — Encounter: Payer: Self-pay | Admitting: Gastroenterology

## 2015-05-07 ENCOUNTER — Ambulatory Visit (INDEPENDENT_AMBULATORY_CARE_PROVIDER_SITE_OTHER): Payer: Medicare Other | Admitting: Gastroenterology

## 2015-05-07 VITALS — BP 126/80 | HR 70 | Ht 62.5 in | Wt 186.0 lb

## 2015-05-07 DIAGNOSIS — R197 Diarrhea, unspecified: Secondary | ICD-10-CM

## 2015-05-07 DIAGNOSIS — R159 Full incontinence of feces: Secondary | ICD-10-CM | POA: Diagnosis not present

## 2015-05-07 NOTE — Progress Notes (Signed)
    History of Present Illness: This is a 75 year old female with diarrhea and frequent fecal incontinence. Her symptoms have been bothersome for the past few months. She does not note any medication or diet changes that correlate with her new diarrhea. She denies any antibiotic usage or foreign travel that correlated with the onset of symptoms. She states she has severe lactose intolerance and avoids all lactose products. She saw Dr. Leighton Ruff on March 27 for evaluation of incontinence. DRE was unremarkable including normal sphincter tone. Anoscopy was performed and was unremarkable. She was started on Imodium twice daily about 3 days ago with no improvement in symptoms. Colonoscopy performed by Dr. Watt Climes in July 2013 showed internal and external hemorrhoids mild sigmoid colon diverticulosis and one hyperplastic polyp.  Current Medications, Allergies, Past Medical History, Past Surgical History, Family History and Social History were reviewed in [employer] record.  Physical Exam: General: Well developed, well nourished, no acute distress Head: Normocephalic and atraumatic Eyes:  sclerae anicteric, EOMI Ears: Normal auditory acuity Mouth: No deformity or lesions Lungs: Clear throughout to auscultation Heart: Regular rate and rhythm; no murmurs, rubs or bruits Abdomen: Soft, non tender and non distended. No masses, hepatosplenomegaly or hernias noted. Normal Bowel sounds Rectal: Deferred with recent normal exam by Dr. Marcello Moores including normal anoscopy, normal sphincter tone and normal squeeze. Musculoskeletal: Symmetrical with no gross deformities  Pulses:  Normal pulses noted Extremities: No clubbing, cyanosis, edema or deformities noted Neurological: Alert oriented x 4, grossly nonfocal Psychological:  Alert and cooperative. Normal mood and affect  Assessment and Recommendations:  1. Diarrhea and fecal incontinence. Normal anoscopy and normal resting and squeeze  sphincter tone noted on DRE. Continue Imodium twice daily. Discontinue iron as this may be leading to diarrhea. Begin WelChol 1 packet twice daily as recommended by Dr. Marcello Moores. If all above measures are unsuccessful begin a low FODMAP diet. REV in 2 months. Consider stool studies, celiac testing and colonoscopy for further evaluation if above measures unsuccessful.  2.  Lactose intolerance. Strictly avoid all lactose-containing products.

## 2015-05-07 NOTE — Patient Instructions (Addendum)
Pick up your prescription for Welchol that Dr. Marcello Moores sent to your pharmacy.   Stay on Imodium twice daily.   You have been given a low Fodmap diet to follow.   Stop taking your iron supplement.   Follow up with Dr. Fuller Plan on 07/14/15 at 11:15am.  Thank you for choosing me and [facility].  Pricilla Riffle. Dagoberto Ligas., MD., Marval Regal

## 2015-05-19 ENCOUNTER — Ambulatory Visit (INDEPENDENT_AMBULATORY_CARE_PROVIDER_SITE_OTHER): Payer: Medicare Other

## 2015-05-19 DIAGNOSIS — E538 Deficiency of other specified B group vitamins: Secondary | ICD-10-CM

## 2015-05-19 MED ORDER — CYANOCOBALAMIN 1000 MCG/ML IJ SOLN
1000.0000 ug | Freq: Once | INTRAMUSCULAR | Status: AC
  Administered 2015-05-19: 1000 ug via INTRAMUSCULAR

## 2015-06-03 ENCOUNTER — Ambulatory Visit: Payer: Medicare Other | Admitting: Pulmonary Disease

## 2015-06-04 ENCOUNTER — Ambulatory Visit: Payer: Medicare Other | Admitting: Pulmonary Disease

## 2015-06-04 ENCOUNTER — Ambulatory Visit (INDEPENDENT_AMBULATORY_CARE_PROVIDER_SITE_OTHER): Payer: Medicare Other | Admitting: Pulmonary Disease

## 2015-06-04 ENCOUNTER — Encounter: Payer: Self-pay | Admitting: Pulmonary Disease

## 2015-06-04 VITALS — BP 132/70 | HR 81 | Ht 63.0 in | Wt 195.0 lb

## 2015-06-04 DIAGNOSIS — K219 Gastro-esophageal reflux disease without esophagitis: Secondary | ICD-10-CM | POA: Diagnosis not present

## 2015-06-04 DIAGNOSIS — J4551 Severe persistent asthma with (acute) exacerbation: Secondary | ICD-10-CM | POA: Diagnosis not present

## 2015-06-04 DIAGNOSIS — J302 Other seasonal allergic rhinitis: Secondary | ICD-10-CM | POA: Diagnosis not present

## 2015-06-04 DIAGNOSIS — J4550 Severe persistent asthma, uncomplicated: Secondary | ICD-10-CM | POA: Diagnosis not present

## 2015-06-04 LAB — PULMONARY FUNCTION TEST
FEF 25-75 POST: 0.72 L/s
FEF 25-75 PRE: 0.54 L/s
FEF2575-%CHANGE-POST: 32 %
FEF2575-%PRED-POST: 52 %
FEF2575-%PRED-PRE: 39 %
FEV1-%CHANGE-POST: 10 %
FEV1-%PRED-POST: 71 %
FEV1-%Pred-Pre: 64 %
FEV1-POST: 1.12 L
FEV1-Pre: 1.01 L
FEV1FVC-%CHANGE-POST: 6 %
FEV1FVC-%PRED-PRE: 85 %
FEV6-%Change-Post: 4 %
FEV6-%PRED-POST: 84 %
FEV6-%Pred-Pre: 80 %
FEV6-PRE: 1.57 L
FEV6-Post: 1.63 L
FEV6FVC-%CHANGE-POST: 0 %
FEV6FVC-%Pred-Post: 104 %
FEV6FVC-%Pred-Pre: 104 %
FVC-%CHANGE-POST: 4 %
FVC-%Pred-Post: 80 %
FVC-%Pred-Pre: 77 %
FVC-Post: 1.63 L
FVC-Pre: 1.57 L
POST FEV1/FVC RATIO: 69 %
PRE FEV6/FVC RATIO: 100 %
Post FEV6/FVC ratio: 100 %
Pre FEV1/FVC ratio: 65 %

## 2015-06-04 MED ORDER — FEXOFENADINE HCL 60 MG PO TABS: 60.0000 mg | ORAL_TABLET | Freq: Two times a day (BID) | ORAL | Status: DC

## 2015-06-04 NOTE — Patient Instructions (Signed)
   Use the albuterol in your nebulizer as needed in place of your albuterol inhaler to help prevent your cough.  I'm starting you on Allegra twice daily to help with your sinus allergies. Keep on using your Singulair & Nasal saline rinse as prescribed.  Continue using your Perforomist and Pulmicort in your nebulizer as prescribed.  I will see you back in 3 months or sooner if needed

## 2015-06-04 NOTE — Progress Notes (Signed)
Spirometry pre and post done today. 

## 2015-06-04 NOTE — Progress Notes (Signed)
Subjective:    Patient ID: [name], female    DOB: 1940-05-10, 75 y.o.   MRN: [id number]  C.C.:  Follow-up for Severe, Persistent Asthma w/ Fixed Airway Obstruction, Allergic Rhinitis, GERD, & Mild-Moderate OSA.  HPI  Severe, Persistent Asthma w/ Fixed Airway Obstruction:  Patient prescribed Perforomist and Pulmicort previously. At last appointment she did have an exacerbation and was recovering. She reports she has had a mild flare since last appointment. She reports she has been waking up only occasionally over the last couple of weeks with coughing. She has also noticed wheezing.   Allergic Rhinitis:  Previously prescribed Singulair. She reports she continues to have significant sinus congestion with nearly constant nasal drainage.   GERD:   Follows with GI. She reports she has had increased reflux over the last two weeks. She has been compliant with Protonix & Pepcid. She is monitoring her diet. She denies any morning brash water taste.   Mild-Moderate OSA:  Being followed by Dr. Rexene Alberts with Neurology.   Review of Systems No fever or chills. Has chronic night sweats. She does have some chest tightness but no pressure. She does report a pain occasionally as well.   Allergies  Allergen Reactions  . Amlodipine   . Banana Nausea And Vomiting    Stomach pumped  . Co Q10 [Coenzyme Q10]     Body cramps  . Codeine     Hallucinate, loose identity and don't know who I am  . Morphine Other (See Comments)    Can not function, it immobilizes me   . Sulfonamide Derivatives Swelling   Current Outpatient Prescriptions on File Prior to Visit  Medication Sig Dispense Refill  . albuterol (PROVENTIL HFA;VENTOLIN HFA) 108 (90 BASE) MCG/ACT inhaler Inhale 2 puffs into the lungs every 6 (six) hours as needed. For shortness of breath. 1 Inhaler 0  . albuterol (PROVENTIL) (2.5 MG/3ML) 0.083% nebulizer solution Take 3 mLs (2.5 mg total) by nebulization every 6 (six) hours as needed. For shortness  of breath. 360 mL 11  . ALPRAZolam (XANAX) 0.25 MG tablet Take 0.25 mg by mouth 2 (two) times daily as needed.     . baclofen (LIORESAL) 20 MG tablet Take 1 tablet by mouth 2 (two) times daily.    . BELSOMRA 5 MG TABS Take 5 mg by mouth daily.  2  . Blood Glucose Monitoring Suppl (ONE TOUCH ULTRA MINI) w/Device KIT 3 (three) times daily. for testing  0  . budesonide (PULMICORT) 0.5 MG/2ML nebulizer solution Take 2 mLs (0.5 mg total) by nebulization 2 (two) times daily. 120 mL 11  . Calcium & Magnesium Carbonates (MYLANTA PO) Take 1 tablet by mouth daily as needed. For indigestion.    . Cholecalciferol (VITAMIN D) 2000 UNITS tablet Take 2,000 Units by mouth daily.     . clopidogrel (PLAVIX) 75 MG tablet Take 1 tablet (75 mg total) by mouth every morning. 90 tablet 3  . Cyanocobalamin (VITAMIN B-12 IJ) Inject as directed every 30 (thirty) days.    Marland Kitchen diltiazem (CARDIZEM CD) 240 MG 24 hr capsule Take 1 capsule (240 mg total) by mouth daily. 30 capsule 10  . doxazosin (CARDURA) 8 MG tablet Take 1 tablet (8 mg total) by mouth daily. 30 tablet 7  . famotidine (PEPCID) 20 MG tablet Take 20 mg by mouth 2 (two) times daily.    . ferrous sulfate 325 (65 FE) MG tablet Take 1 tablet (325 mg total) by mouth 3 (three) times daily after  meals. 30 tablet 0  . formoterol (PERFOROMIST) 20 MCG/2ML nebulizer solution Take 2 mLs (20 mcg total) by nebulization 2 (two) times daily. 120 mL 11  . furosemide (LASIX) 40 MG tablet Take 40 mg by mouth every other day.     Marland Kitchen HYDROmorphone (DILAUDID) 2 MG tablet Take 1 tablet (2 mg total) by mouth every 4 (four) hours as needed for severe pain. 80 tablet 0  . insulin lispro protamine-insulin lispro (HUMALOG 75/25) (75-25) 100 UNIT/ML SUSP 15 in am, 15 in evening    . isosorbide mononitrate (IMDUR) 30 MG 24 hr tablet Take 1 tablet (30 mg total) by mouth daily. 31 tablet 11  . loperamide (IMODIUM) 2 MG capsule Take 2 mg by mouth 2 (two) times daily.    . meclizine (ANTIVERT) 25  MG tablet Take 25 mg by mouth as needed for dizziness (dizziness).     . metoprolol succinate (TOPROL-XL) 50 MG 24 hr tablet Take 1 tablet by mouth daily.    . montelukast (SINGULAIR) 5 MG chewable tablet Chew 1 tablet (5 mg total) by mouth at bedtime. 30 tablet 3  . Multiple Vitamin (MULTIVITAMIN WITH MINERALS) TABS Take 1 tablet by mouth daily.    . Nebulizers (COMPRESSOR/NEBULIZER) MISC Use with albuterol 1 each 0  . neomycin-polymyxin b-dexamethasone (MAXITROL) 3.5-10000-0.1 OINT Apply topically as needed    . nitroGLYCERIN (NITROSTAT) 0.4 MG SL tablet Place 0.4 mg under the tongue every 5 (five) minutes x 3 doses as needed. For chest pain.    . ONE TOUCH ULTRA TEST test strip CHECK BLOOD SUGAR TWICE A DAY DX: E11.9  3  . ONETOUCH DELICA LANCETS 67M MISC 3 (three) times daily. for testing  0  . pantoprazole (PROTONIX) 40 MG tablet Take 1 tablet (40 mg total) by mouth daily. 30 tablet 11  . potassium chloride SA (K-DUR,KLOR-CON) 20 MEQ tablet Take 20 mEq by mouth 2 (two) times daily.    . prednisoLONE acetate (PRED FORTE) 1 % ophthalmic suspension Place 1 drop into the left eye 4 (four) times daily.  0   Current Facility-Administered Medications on File Prior to Visit  Medication Dose Route Frequency Provider Last Rate Last Dose  . cyanocobalamin ((VITAMIN B-12)) injection 1,000 mcg  1,000 mcg Intramuscular Q30 days Binnie Rail, MD   1,000 mcg at 04/16/15 1520   Past Medical History  Diagnosis Date  . Coronary artery disease   . Diabetes mellitus   . Hypertension   . Hyperlipidemia   . Shoulder injury     resolved after shoulder surgery  . Chest pain   . Acute myocardial infarction, unspecified site, episode of care unspecified   . Unspecified asthma(493.90)   . PONV (postoperative nausea and vomiting)   . Anxiety   . GERD (gastroesophageal reflux disease)   . Upper respiratory symptom     02-13-14 "runny nose, mild cough,sniffles" -instructed to inform MD if symptoms worsens  .  Schatzki's ring   . Hiatal hernia   . Adenomatous colon polyp    Past Surgical History  Procedure Laterality Date  . Cardiac catheterization  07/23/2009    EF 60%  . Cardiac catheterization  10/11/2008  . Cardiac catheterization  03/01/2007    EF 75-80%  . Coronary artery bypass graft      SEVERELY DISEASED SAPHENOUS VEIN GRAFT TO THE RIGHT CORONARY ARTERY BUT WITH FAIRLY WELL PRESERVED FLOW TO THE DISTAL RIGHT CORONARY ARTERY FROM THE NATIVE CIRCULATION-RESTART  CATH IN JUNE 2000, REVEALS MILD/MODERATE  CAD WITH GOOD FLOW DOWN HER LAD  . US echocardiography  03/08/2008    EF 55-60%  . Cardiovascular stress test  11/15/2007    EF 60%  . Rotator cuff repair      right and left  . Tumor removed kidney    . Tonsillectomy      age 70  . Abdominal hysterectomy    . Appendectomy      came out with Hysterectomy  . Eye surgery      bilateral cataract surgery with lens implant  . Total knee arthroplasty Right 02/20/2014    Procedure: RIGHT TOTAL KNEE ARTHROPLASTY;  Surgeon: Tobi Bastos, MD;  Location: [facility];  Service: Orthopedics;  Laterality: Right;  . Colonoscopy    . Esophagogastroduodenoscopy    . Esophageal dilation    . Lense removal Left    Family History  Problem Relation Age of Onset  . Emphysema Paternal Uncle   . Heart disease Maternal Grandfather   . Heart failure Maternal Grandfather   . Esophageal cancer Brother 64    she said he was born with it  . Heart attack Father   . Stomach cancer Father   . Neuropathy Neg Hx   . Multiple sclerosis Neg Hx   . Rheum arthritis Sister   . Emphysema Paternal Aunt   . Colon cancer Neg Hx   . Diabetes Mother   . Diabetes Maternal Grandfather    Social History   Social History  . Marital Status: Widowed    Spouse Name: N/A  . Number of Children: 4  . Years of Education: Doctorate   Occupational History  . Retired    Social History Main Topics  . Smoking status: Former Smoker -- 0.50 packs/day for 21 years    Types:  Cigarettes    Start date: 02/09/1955    Quit date: 02/09/1976  . Smokeless tobacco: Never Used  . Alcohol Use: No  . Drug Use: No  . Sexual Activity: Not on file   Other Topics Concern  . Not on file   Social History Narrative   Lives alone.   Caffeine use: Drinks 1 cup coffee/day      Originally from [place]. Previously has lived in [place]. Prior travel to [place], [place], [place], [place], [place], [place], [place], & [place]. Previously worked in [employer] She has a dog currently. No bird, mold, or hot tub exposure. She also pastors a church.       Objective:   Physical Exam There were no vitals taken for this visit. General:  Obese female. Comfortable. No distress. Integument:  Warm & dry. No bruising exposed skin. HEENT:  Moist mucus membranes. Moderate bilateral nasal turbinate swelling unchanged.  Cardiovascular:  Regular rate. No edema. No appreciable JVD.  Pulmonary: Clear to auscultation bilaterally. Normal breathing on room air. No wheezing appreciated.  Abdomen: Soft. Normal bowel sounds. Protuberant. Musculoskeletal:  Normal bulk and tone. No joint deformity or effusion appreciated.  PFT 06/04/15: FVC 1.57 L (77%) FEV1 1.01 L (64%) FEV1/FVC 0.65 FEF 25-75 0.54 L (39%) negative bronchodilator response 02/21/15: FVC 1.60 L (78%) FEV1 1.03 L (65%) FEV1/FVC 0.64 FEF 25-75 0.51 L (37%) positive bronchodilator response 11/20/14: FVC 1.44 L (70%) FEV1 0.91 L (57%) FEV1/FVC 0.63 FEF 25-75 0.47 L (33%) positive bronchodilator response TLC 3.92 L (79%) RV 103% DLCO uncorrected 64% 11/12/13: FVC 1.57 L (74%) FEV1 1.09 L (67%) FEV1/FVC 0.70 FEF 25-75 0.72 L (50%) negative bronchodilator response 08/04/11: FVC 1.61  L (60%) FEV1 0.89 L (48%) FEV1/FVC 0.56 FEF 25-75 0.29 L (14%) positive bronchodilator response TLC 3.67 L (80%) RV 103% ERV 46% DLCO corrected 38%  6MWT 11/20/14:  Walked 336 meters / Baseline Sat 100% on RA / Nadir Sat 99% on RA (pt c/o pain in her right neck w/  dyspnea)  IMAGING CXR PA/LAT 03/13/15 (personally reviewed by me): No new nodule or opacity appreciated. No pleural effusion. Mild hyperinflation with flattening of the diaphragms. Heart normal in size. Mediastinum normal in contour.  BARIUM SWALLOW (10/15/14): Schatzki ring distal esophagus maximum diameter 1.2 cm with barium tablet impacting at this ring.  CXR PA/LAT 02/13/14 (previously reviewed by me):  Sternotomy wires from prior thoracic surgery noted. Heart normal in size. No pleural effusion appreciated. No parenchymal nodule or opacification appreciated. Mild flattening of the diaphragms bilaterally.  CARDIAC TTE (10/06/11): LV normal in size. Normal regional wall motion. EF 60-65%. Grade 1 diastolic dysfunction. LA & RA normal in size. 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 RV normal in size and function. RVSP 19 mmHg. No aortic stenosis or regurgitation. No mitral stenosis or regurgitation. No pulmonic stenosis. Trivial tricuspid regurgitation. No pericardial effusion.  LABS 03/13/15 Procalcitonin:  <0.1 CBC: 10.9/11.8/37.3/245  10/08/14 IgG: 1060 IgM: 112 IgA: 272 IgE: 5 CBC: 6.1/11.3/36.0/293 Differential: Eos 0.3 (4.5%)  RAST panel: Negative Aspergillus antigen: <0.1  1/26/9 ABG:  7.39/44/113    Assessment & Plan:  75 year old female with severe, persistent asthma with airways obstruction. Patient's spirometry today remains stable with moderate airway obstruction. I suspect that her ongoing cough and intermittent wheezing is likely due to postnasal drainage from her allergic rhinitis. She does not have any wheezing or physical exam findings today that would suggest an asthma exacerbation. Her reflux has improving control as well which may have been contributing somewhat to her cough. I instructed the patient to contact our office if she had any new breathing problems before her next appointment.  1. Severe, persistent asthma w/ fixed airway obstruction: Continuing patient  on Pulmicort and Perforomist. No changes. 2. GERD: Patient continuing on Protonix & Pepcid. No changes. 3. Allergic rhinitis: Continuing Singulair & nasal saline rinse. Starting Allegra 60 mg by mouth twice a day. 4. OSA: Management per Neurology. 5. Health maintenance: Pneumovax January 2006 & influenza vaccine October 2016. 6. Follow-up: Return to clinic in 3 months or sooner if needed.  Sonia Baller Ashok Cordia, M.D. [facility] Pager:  [phone] After 3pm or if no response, call [phone] 1:29 PM 06/04/2015

## 2015-06-10 ENCOUNTER — Telehealth: Payer: Self-pay | Admitting: Neurology

## 2015-06-10 NOTE — Telephone Encounter (Signed)
[name], have patient follow up asap. Does megan have an appointment available this week? If not squeeze her in with me this week.

## 2015-06-10 NOTE — Telephone Encounter (Addendum)
Tried calling pt to schedule f/u either tomorrow or Thursday with Hedwig Morton , NP per Dr Jaynee Eagles request. Please schedule if she calls back. Please have patientt bring CPAP and make sure SD card in machine when she comes for f/u. Unable to LVM. Mailbox full.

## 2015-06-10 NOTE — Telephone Encounter (Signed)
Patient reports that her headaches are occuring everyday and are getting worse. They cause double vision now. She is using CPAP but not fully compliant, how do you want to proceed?

## 2015-06-10 NOTE — Telephone Encounter (Signed)
Patient called, sees Dr. Rexene Alberts for sleep apnea, has upcoming appointment with Dr. Rexene Alberts 06/18/15, patient states she has been having headaches "so bad, they are waking me up in the morning".

## 2015-06-10 NOTE — Telephone Encounter (Signed)
LVM for pt to call back. Advised I got her message from earlier and was calling to offer appt with nurse practitioner at our office this week for her to be seen. Gave [facility] phone number.

## 2015-06-11 NOTE — Telephone Encounter (Signed)
Checked on status. Pt scheduled 06/12/15 with Hedwig Morton, NP at 10am.

## 2015-06-12 ENCOUNTER — Ambulatory Visit (INDEPENDENT_AMBULATORY_CARE_PROVIDER_SITE_OTHER): Payer: Medicare Other | Admitting: Adult Health

## 2015-06-12 ENCOUNTER — Encounter: Payer: Self-pay | Admitting: Adult Health

## 2015-06-12 VITALS — BP 132/60 | HR 82 | Resp 18 | Ht 63.0 in | Wt 193.0 lb

## 2015-06-12 DIAGNOSIS — G4733 Obstructive sleep apnea (adult) (pediatric): Secondary | ICD-10-CM | POA: Diagnosis not present

## 2015-06-12 DIAGNOSIS — R519 Headache, unspecified: Secondary | ICD-10-CM

## 2015-06-12 DIAGNOSIS — R51 Headache: Secondary | ICD-10-CM

## 2015-06-12 DIAGNOSIS — Z9989 Dependence on other enabling machines and devices: Principal | ICD-10-CM

## 2015-06-12 NOTE — Patient Instructions (Signed)
Try using he CPAP nightly- all night In headache still severe in 1 month call us- will add on ONO If you develop difficulty speaking, one sided weakness/numbness, facial droop on one side. Please go to the ED. Consider OTC medication to treat allergies.  If your symptoms worsen or you develop new symptoms please let us know.

## 2015-06-12 NOTE — Progress Notes (Addendum)
PATIENT: [name] DOB: [birth year]  REASON FOR VISIT: follow up- obstructive sleep apnea on CPAP, gait instability, morning headaches HISTORY FROM: patient  HISTORY OF PRESENT ILLNESS: [name] is a 75 year old female with a history of obstructive sleep apnea on CPAP, gait instability and morning headaches. She returns today complaining of severe morning headaches that wake her up from sleep. She also describes some double vision. She brought her CPAP machine today and her download indicates that she uses her machine 13 out of 21 days for compliance of 62%. She uses her machine greater than 4 hours 5 out of 21 days for compliance of 24%. On average she is using her machine for 4 hours and 14 minutes. Her residual AHI is 2.2 on 9 cm of water. She does have a significant leak in the 95th percentile at 30.4. She states that on Monday she went to her DME company and had her mask refitted. She states that since then the mask has not been leaking and she is able to use it more consistently. The patient reports that she continues to have a severe morning headache. She describes the headache as a thunder sensation in the right occipital region. She states that as the morning progresses her headache resolves. When she has the severe headaches she does have some blurry vision due to the discomfort. She states that the blurry vision will only last 10-15 minutes. She has not tried taking any over-the-counter medications to treat her headache. She denies any unilateral weakness or numbness, denies facial droop or trouble speaking. Denies light or noise sensitivity. The patient reports that she continues to have difficulty with her gait. She uses a cane when ambulating. The patient had blood work in February that was relatively unremarkable. She returns today for an evaluation.    HISTORY [facility]): [name], upon your kind request in my clinic today for initial consultation of her sleep disturbance, in  particular, concern for underlying obstructive sleep apnea. The patient is unaccompanied today. As you know, [name] is a 75 year old right-handed woman with an underlying medical history of hyperlipidemia, CAD, s/p 3 vessel CABG, B12 deficiency, hypertension, type 2 diabetes, neck pain, NASH and obesity, who reports snoring and non-restorative sleep, nocturia of 3-4/night, as well as morning headaches for the past 1+ year. I reviewed your office note from 08/28/2014. She presented with a history of gait disorder, balance problems and fecal incontinence. You suggested a brain MRI without contrast, cervical spine MRI without contrast as well as lumbar spine MRI without contrast. She had these on 09/16/2014:  This is a normal age-appropriate MRI of the brain without contrast. There are no acute findings. Incidental note is made of a "partially empty" sella turcica, unchanged when compared to 2013.  This is an abnormal MRI of the cervical spine showing multilevel degenerative changes as detailed above. The most significant findings are: 1. Stable left greater than right uncovertebral spurring at C3-C4 causing moderate left foraminal narrowing is no nerve root compression but there is some encroachment upon the exiting left C4 nerve root.  2. Mild anterolisthesis at C4-C5 associated with mild facet hypertrophy. There is severe right uncovertebral spurring causing moderate foraminal narrowing. There is no definite nerve root compression though there is some encroachment upon the right C5 nerve root. The anterolisthesis has progressed slightly when compared to 2006. 3. Mild anterolisthesis of C5-C6 associated with mild facet hypertrophy. There does not appear to be nerve root impingement at this level. The  anterolisthesis has progressed slightly when compared to 2006. 4. Disc bulging and uncovertebral spurring associated with moderately severe loss of disc height at C6-C7. There is moderate bilateral  foraminal narrowing. Although there is no definite nerve root compression, there is some encroachment upon the exiting C7 nerve roots.  This is an abnormal MRI of the lumbar spine showed the following: 1. Normal conus medullaris and cauda equina. 2. Moderate facet hypertrophy at L3-L4 with joint effusions, severe facet hypertrophy at L4-L5 with large joint effusions and moderate facet hypertrophy at L5-S1 with a small right joint effusion. At L4-L5, there is right greater than left lateral recess stenosis. There does not appear to be any definite nerve root compression though there is some encroachment upon both of the traversing L5 nerve roots. You called the patient with the test results and also suggested further workup through gastroenterology. Of note, she has difficulty falling asleep and staying asleep. This has been going on for years. Also, noteworthy, over 10 years ago she was diagnosed with obstructive sleep apnea and had a sleep study. Prior sleep test results are not available for my review. She was given a CPAP machine and states that she tried it for perhaps a year but it was a struggle. She had problems with mask fitting and tolerance and finally gave up on it. She would be willing to get tested again and try CPAP again. She lives alone. She is widowed. She has 4 grown children. She has a variable sleep schedule. She has a bedtime of around 11:30 PM and has some difficulty falling asleep. She may take a sleeping pill. She is up after 3 or 4 hours. She also reports significant nocturia of 3-4 times each night. 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 She reads in bed. She really watches TV in bed. She drinks one cup of coffee in the morning and typically no sodas and occasional tea. She is trying to lose weight. After her knee replacement surgery in January of this year she has lost a lot of weight, which was unintended and secondary to severe loss of appetite. She quit  smoking 30 years ago. She does not drink alcohol. She denies restless leg symptoms or leg twitching at night. She had a right total shoulder replacement, left rotator cuff surgery, right knee replacement surgery and triple bypass in 2011.    REVIEW OF SYSTEMS: Out of a complete 14 system review of symptoms, the patient complains only of the following symptoms, and all other reviewed systems are negative.  Eye itching, eye redness, double vision, fatigue, ringing in ears, runny nose, wheezing, shortness of breath, chest tightness, incontinence of bowels, apnea, frequent waking, muscle cramps, dizziness, headache  ALLERGIES: Allergies  Allergen Reactions  . Amlodipine   . Banana Nausea And Vomiting    Stomach pumped  . Co Q10 [Coenzyme Q10]     Body cramps  . Codeine     Hallucinate, loose identity and don't know who I am  . Morphine Other (See Comments)    Can not function, it immobilizes me   . Sulfonamide Derivatives Swelling    HOME MEDICATIONS: Outpatient Prescriptions Prior to Visit  Medication Sig Dispense Refill  . albuterol (PROVENTIL HFA;VENTOLIN HFA) 108 (90 BASE) MCG/ACT inhaler Inhale 2 puffs into the lungs every 6 (six) hours as needed. For shortness of breath. 1 Inhaler 0  . albuterol (PROVENTIL) (2.5 MG/3ML) 0.083% nebulizer solution Take 3 mLs (2.5 mg total) by  nebulization every 6 (six) hours as needed. For shortness of breath. 360 mL 11  . ALPRAZolam (XANAX) 0.25 MG tablet Take 0.25 mg by mouth 2 (two) times daily as needed.     . baclofen (LIORESAL) 20 MG tablet Take 1 tablet by mouth 2 (two) times daily.    . BELSOMRA 5 MG TABS Take 5 mg by mouth daily.  2  . Blood Glucose Monitoring Suppl (ONE TOUCH ULTRA MINI) w/Device KIT 3 (three) times daily. for testing  0  . budesonide (PULMICORT) 0.5 MG/2ML nebulizer solution Take 2 mLs (0.5 mg total) by nebulization 2 (two) times daily. 120 mL 11  . Calcium & Magnesium Carbonates (MYLANTA PO) Take 1 tablet by mouth daily as  needed. For indigestion.    . Cholecalciferol (VITAMIN D) 2000 UNITS tablet Take 2,000 Units by mouth daily.     . clopidogrel (PLAVIX) 75 MG tablet Take 1 tablet (75 mg total) by mouth every morning. 90 tablet 3  . Cyanocobalamin (VITAMIN B-12 IJ) Inject as directed every 30 (thirty) days.    Marland Kitchen diltiazem (CARDIZEM CD) 240 MG 24 hr capsule Take 1 capsule (240 mg total) by mouth daily. 30 capsule 10  . doxazosin (CARDURA) 8 MG tablet Take 1 tablet (8 mg total) by mouth daily. 30 tablet 7  . famotidine (PEPCID) 20 MG tablet Take 20 mg by mouth 2 (two) times daily.    . ferrous sulfate 325 (65 FE) MG tablet Take 1 tablet (325 mg total) by mouth 3 (three) times daily after meals. 30 tablet 0  . fexofenadine (ALLEGRA) 60 MG tablet Take 1 tablet (60 mg total) by mouth 2 (two) times daily. 60 tablet 3  . formoterol (PERFOROMIST) 20 MCG/2ML nebulizer solution Take 2 mLs (20 mcg total) by nebulization 2 (two) times daily. 120 mL 11  . furosemide (LASIX) 40 MG tablet Take 40 mg by mouth every other day.     Marland Kitchen HYDROmorphone (DILAUDID) 2 MG tablet Take 1 tablet (2 mg total) by mouth every 4 (four) hours as needed for severe pain. 80 tablet 0  . insulin lispro protamine-insulin lispro (HUMALOG 75/25) (75-25) 100 UNIT/ML SUSP 15 in am, 15 in evening    . isosorbide mononitrate (IMDUR) 30 MG 24 hr tablet Take 1 tablet (30 mg total) by mouth daily. 31 tablet 11  . loperamide (IMODIUM) 2 MG capsule Take 2 mg by mouth 2 (two) times daily.    . meclizine (ANTIVERT) 25 MG tablet Take 25 mg by mouth as needed for dizziness (dizziness).     . metoprolol succinate (TOPROL-XL) 50 MG 24 hr tablet Take 1 tablet by mouth daily.    . montelukast (SINGULAIR) 5 MG chewable tablet Chew 1 tablet (5 mg total) by mouth at bedtime. 30 tablet 3  . Multiple Vitamin (MULTIVITAMIN WITH MINERALS) TABS Take 1 tablet by mouth daily.    . Nebulizers (COMPRESSOR/NEBULIZER) MISC Use with albuterol 1 each 0  . neomycin-polymyxin  b-dexamethasone (MAXITROL) 3.5-10000-0.1 OINT Apply topically as needed    . nitroGLYCERIN (NITROSTAT) 0.4 MG SL tablet Place 0.4 mg under the tongue every 5 (five) minutes x 3 doses as needed. For chest pain.    . ONE TOUCH ULTRA TEST test strip CHECK BLOOD SUGAR TWICE A DAY DX: E11.9  3  . ONETOUCH DELICA LANCETS 32G MISC 3 (three) times daily. for testing  0  . pantoprazole (PROTONIX) 40 MG tablet Take 1 tablet (40 mg total) by mouth daily. 30 tablet 11  .  potassium chloride SA (K-DUR,KLOR-CON) 20 MEQ tablet Take 20 mEq by mouth 2 (two) times daily.    . prednisoLONE acetate (PRED FORTE) 1 % ophthalmic suspension Place 1 drop into the left eye 4 (four) times daily.  0   Facility-Administered Medications Prior to Visit  Medication Dose Route Frequency Provider Last Rate Last Dose  . cyanocobalamin ((VITAMIN B-12)) injection 1,000 mcg  1,000 mcg Intramuscular Q30 days Binnie Rail, MD   1,000 mcg at 04/16/15 1520    PAST MEDICAL HISTORY: Past Medical History  Diagnosis Date  . Coronary artery disease   . Diabetes mellitus   . Hypertension   . Hyperlipidemia   . Shoulder injury     resolved after shoulder surgery  . Chest pain   . Acute myocardial infarction, unspecified site, episode of care unspecified   . Unspecified asthma(493.90)   . PONV (postoperative nausea and vomiting)   . Anxiety   . GERD (gastroesophageal reflux disease)   . Upper respiratory symptom     02-13-14 "runny nose, mild cough,sniffles" -instructed to inform MD if symptoms worsens  . Schatzki's ring   . Hiatal hernia   . Adenomatous colon polyp     PAST SURGICAL HISTORY: Past Surgical History  Procedure Laterality Date  . Cardiac catheterization  07/23/2009    EF 60%  . Cardiac catheterization  10/11/2008  . Cardiac catheterization  03/01/2007    EF 75-80%  . Coronary artery bypass graft      SEVERELY DISEASED SAPHENOUS VEIN GRAFT TO THE RIGHT CORONARY ARTERY BUT WITH FAIRLY WELL PRESERVED FLOW TO THE  DISTAL RIGHT CORONARY ARTERY FROM THE NATIVE CIRCULATION-RESTART  CATH IN JUNE 2000, REVEALS MILD/MODERATE  CAD WITH GOOD FLOW DOWN HER LAD  . US echocardiography  03/08/2008    EF 55-60%  . Cardiovascular stress test  11/15/2007    EF 60%  . Rotator cuff repair      right and left  . Tumor removed kidney    . Tonsillectomy      age 26  . Abdominal hysterectomy    . Appendectomy      came out with Hysterectomy  . Eye surgery      bilateral cataract surgery with lens implant  . Total knee arthroplasty Right 02/20/2014    Procedure: RIGHT TOTAL KNEE ARTHROPLASTY;  Surgeon: Tobi Bastos, MD;  Location: [facility];  Service: Orthopedics;  Laterality: Right;  . Colonoscopy    . Esophagogastroduodenoscopy    . Esophageal dilation    . Lense removal Left     FAMILY HISTORY: Family History  Problem Relation Age of Onset  . Emphysema Paternal Uncle   . Heart disease Maternal Grandfather   . Heart failure Maternal Grandfather   . Esophageal cancer Brother 79    she said he was born with it  . Heart attack Father   . Stomach cancer Father   . Neuropathy Neg Hx   . Multiple sclerosis Neg Hx   . Rheum arthritis Sister   . Emphysema Paternal Aunt   . Colon cancer Neg Hx   . Diabetes Mother   . Diabetes Maternal Grandfather     SOCIAL HISTORY: Social History   Social History  . Marital Status: Widowed    Spouse Name: N/A  . Number of Children: 4  . Years of Education: Doctorate   Occupational History  . Retired    Social History Main Topics  . Smoking status: Former Smoker -- 0.50 packs/day for 21  years    Types: Cigarettes    Start date: 02/09/1955    Quit date: 02/09/1976  . Smokeless tobacco: Never Used  . Alcohol Use: No  . Drug Use: No  . Sexual Activity: Not on file   Other Topics Concern  . Not on file   Social History Narrative   Lives alone.   Caffeine use: Drinks 1 cup coffee/day      Originally from [place]. Previously has lived in [place]. Prior travel to  [place], [place], [place], [place], [place], [place], [place], & [place]. Previously worked in [employer] She has a dog currently. No bird, mold, or hot tub exposure. She also pastors a church.       PHYSICAL EXAM  Filed Vitals:   06/12/15 1019  BP: 132/60  Pulse: 82  Resp: 18  Height: _0  (1.6 m)  Weight: 193 lb (87.544 kg)   Body mass index is 34.2 kg/(m^2).  Generalized: Well developed, in no acute distress   Neurological examination  Mentation: Alert oriented to time, place, history taking. Follows all commands speech and language fluent Cranial nerve II-XII: Pupils were equal round reactive to light. Extraocular movements were full, visual field were full on confrontational test. Facial sensation and strength were normal. Uvula tongue midline. Head turning and shoulder shrug  were normal and symmetric. Motor: The motor testing reveals 5 over 5 strength of all 4 extremities. Good symmetric motor tone is noted throughout.  Sensory: Sensory testing is intact to soft touch on all 4 extremities. No evidence of extinction is noted.  Coordination: Cerebellar testing reveals good finger-nose-finger and heel-to-shin bilaterally.  Gait and station: Uses cane ambulate. Romberg is negative.  Reflexes: Deep tendon reflexes are symmetric and normal bilaterally.   DIAGNOSTIC DATA (LABS, IMAGING, TESTING) - I reviewed patient records, labs, notes, testing and imaging myself where available.  Lab Results  Component Value Date   WBC 6.2 03/31/2015   HGB 12.2 03/31/2015   HCT 37.3 03/31/2015   MCV 79.0 03/31/2015   PLT 284.0 03/31/2015      Component Value Date/Time   NA 142 03/31/2015 1648   K 4.2 03/31/2015 1648   CL 106 03/31/2015 1648   CO2 31 03/31/2015 1648   GLUCOSE 119* 03/31/2015 1648   BUN 18 03/31/2015 1648   CREATININE 0.82 03/31/2015 1648   CALCIUM 9.3 03/31/2015 1648   PROT 7.3 03/31/2015 1648   ALBUMIN 4.2 03/31/2015 1648   AST 15 03/31/2015 1648   ALT 13  03/31/2015 1648   ALKPHOS 91 03/31/2015 1648   BILITOT 0.3 03/31/2015 1648   GFRNONAA 69* 02/22/2014 0416   GFRAA 80* 02/22/2014 0416    Lab Results  Component Value Date   HGBA1C 7.7* 03/31/2015   Lab Results  Component Value Date   VITAMINB12 253 03/31/2015   Lab Results  Component Value Date   TSH 0.58 03/31/2015      ASSESSMENT AND PLAN 75 y.o. year old female  has a past medical history of Coronary artery disease; Diabetes mellitus; Hypertension; Hyperlipidemia; Shoulder injury; Chest pain; Acute myocardial infarction, unspecified site, episode of care unspecified; Unspecified asthma(493.90); PONV (postoperative nausea and vomiting); Anxiety; GERD (gastroesophageal reflux disease); Upper respiratory symptom; Schatzki's ring; Hiatal hernia; and Adenomatous colon polyp. here with:  1. Obstructive sleep apnea on CPAP 2. Morning headache  The patient has been advised that she should begin using the CPAP consistently every night. Advised that she should at least use the machine for 4 hours but  we prefer that she use it the entire time she is sleeping. She was also  advised that he can take 2-3 months before her symptoms improve or resolve after using the CPAP. If she continues to have a severe headache in 1 month we may consider doing an ONO. Patient is amenable to this plan. She will follow-up in 3 months with Dr. Rexene Alberts.  Ward Givens, MSN, NP-C 06/12/2015, 10:13 AM [facility] [address], [place], [place] [address] [phone]  I reviewed the above note and documentation by the Nurse Practitioner and agree with the history, physical exam, assessment and plan as outlined above. I was immediately available for face-to-face consultation. Star Age, MD, PhD [facility])   Personally participated in and made any corrections needed to history, physical, neuro exam,assessment and plan as stated above, evaluated lab date,  reviewed imaging studies and agree with radiology interpretations.   Sarina Ill, MD [facility]

## 2015-06-18 ENCOUNTER — Ambulatory Visit: Payer: Self-pay | Admitting: Neurology

## 2015-06-23 ENCOUNTER — Encounter: Payer: Self-pay | Admitting: Cardiovascular Disease

## 2015-06-23 ENCOUNTER — Ambulatory Visit (INDEPENDENT_AMBULATORY_CARE_PROVIDER_SITE_OTHER): Payer: Medicare Other | Admitting: Cardiovascular Disease

## 2015-06-23 VITALS — BP 126/70 | HR 59 | Ht 63.0 in | Wt 185.0 lb

## 2015-06-23 DIAGNOSIS — I2510 Atherosclerotic heart disease of native coronary artery without angina pectoris: Secondary | ICD-10-CM | POA: Diagnosis not present

## 2015-06-23 DIAGNOSIS — I10 Essential (primary) hypertension: Secondary | ICD-10-CM

## 2015-06-23 MED ORDER — ISOSORBIDE MONONITRATE ER 30 MG PO TB24: 30.0000 mg | ORAL_TABLET | Freq: Every day | ORAL | Status: DC

## 2015-06-23 NOTE — Progress Notes (Signed)
Cardiology Office Note   Date:  06/23/2015   ID:  [name], DOB [birth year], MRN [id number]  PCP:  Binnie Rail, MD  Cardiologist:   Mertie Moores, MD   Chief Complaint  Patient presents with  . Coronary Artery Disease   1. Coronary artery disease-status post CABG, the saphenous vein graft to the right coronary artery is severely diseased but she has good flow to the distal right coronary artery from the native circulation. 2. Diabetes mellitus 3. Hypertension 4. Hyperlipidemia 5. COPD: June, 2013 - FEV1 equals 0.89 L which is 48% of predicted value. Her DLCO is also markedly depressed. 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   History of Present Illness:  [name] is a 75 y.o. female with the above noted hx. She continues to have episodes of sharp pain ( few seconds) followed by a burning pain ( last 6-7 minutes). She usually does not take NTG because it causes a headache. She was started on Lasix earlier this year. She seems to be doing much better on Lasix. She's had only function test which revealed severe obstructive lung disease with a good initial response to bronchodilators.  She is scheduled to have surgery all right knee. She's here today for preoperative evaluation prior to having the surgery.  September 04, 2012:  [name] is seen today for a follow up visit. She saw Richardson Dopp in April and was started on bystolic. She has tolerated that fairly well. She has some episodes of CP that are constant. She is very stable.   03/01/2013:  She continues to have significant CP. Still short of breath. She has no energy. Just does not feel well in general.  She does not get much sleep - perhaps 3 hours a night.   Jan. 8, 2016:  [name] is a 75 yo who we follow for CAD, DM, HTN, hyperlipidemia, . She has COPD: FEV1 equals 0.89 L which is 48% of predicted value. Her DLCO is also markedly depressed. She has severe  obstructive lung disease with a beneficial response to bronchodilators.  Breathing is the same. Has some upper respiratory issues.  Still has some tightness.    Jun 17, 2014:    [name] is a 75 y.o. female who presents for episodes of CP    Started 1 1/2 months ago.  Has tried mylanta - thought it was gas.  Did not get any better Occurs wjith exertion or at rest. , left sided chest pain   No radiation Gets lightheaded. Has tried NTG which seems to help.    Nov. 15 2016:  Has not been feeling well for the past couple of months Has a chest pressure when she gets out of bed.  Occurs twice a day - in the AM when she gets up  And then again at 3:30 in the afternoon.   Is active between those times , makes home visit.   Does water aerobics without any CP / pressure.   Wears home O2 at night.     Jun 23, 2015: Doing the same.  Still has the same chest pain  Last cath was in 2014 - showed patent LIMA to LAD, SVG to D1, the SVG to RCA was occluded   Native RCA has a 40-50% prox stenosis   The pains occur typically twice a day - Upon awakening and around 7 PM.   Past Medical History  Diagnosis Date  . Coronary artery  disease   . Diabetes mellitus   . Hypertension   . Hyperlipidemia   . Shoulder injury     resolved after shoulder surgery  . Chest pain   . Acute myocardial infarction, unspecified site, episode of care unspecified   . Unspecified asthma(493.90)   . PONV (postoperative nausea and vomiting)   . Anxiety   . GERD (gastroesophageal reflux disease)   . Upper respiratory symptom     02-13-14 "runny nose, mild cough,sniffles" -instructed to inform MD if symptoms worsens  . Schatzki's ring   . Hiatal hernia   . Adenomatous colon polyp     Past Surgical History  Procedure Laterality Date  . Cardiac catheterization  07/23/2009    EF 60%  . Cardiac catheterization  10/11/2008  . Cardiac catheterization  03/01/2007    EF 75-80%  . Coronary artery bypass graft       SEVERELY DISEASED SAPHENOUS VEIN GRAFT TO THE RIGHT CORONARY ARTERY BUT WITH FAIRLY WELL PRESERVED FLOW TO THE DISTAL RIGHT CORONARY ARTERY FROM THE NATIVE CIRCULATION-RESTART  CATH IN JUNE 2000, REVEALS MILD/MODERATE  CAD WITH GOOD FLOW DOWN HER LAD  . US echocardiography  03/08/2008    EF 55-60%  . Cardiovascular stress test  11/15/2007    EF 60%  . Rotator cuff repair      right and left  . Tumor removed kidney    . Tonsillectomy      age 34  . Abdominal hysterectomy    . Appendectomy      came out with Hysterectomy  . Eye surgery      bilateral cataract surgery with lens implant  . Total knee arthroplasty Right 02/20/2014    Procedure: RIGHT TOTAL KNEE ARTHROPLASTY;  Surgeon: Tobi Bastos, MD;  Location: [facility];  Service: Orthopedics;  Laterality: Right;  . Colonoscopy    . Esophagogastroduodenoscopy    . Esophageal dilation    . Lense removal Left      Current Outpatient Prescriptions  Medication Sig Dispense Refill  . albuterol (PROVENTIL HFA;VENTOLIN HFA) 108 (90 BASE) MCG/ACT inhaler Inhale 2 puffs into the lungs every 6 (six) hours as needed. For shortness of breath. 1 Inhaler 0  . albuterol (PROVENTIL) (2.5 MG/3ML) 0.083% nebulizer solution Take 3 mLs (2.5 mg total) by nebulization every 6 (six) hours as needed. For shortness of breath. 360 mL 11  . ALPRAZolam (XANAX) 0.25 MG tablet Take 0.25 mg by mouth 2 (two) times daily as needed.     . baclofen (LIORESAL) 20 MG tablet Take 1 tablet by mouth 2 (two) times daily.    . Blood Glucose Monitoring Suppl (ONE TOUCH ULTRA MINI) w/Device KIT 3 (three) times daily. for testing  0  . budesonide (PULMICORT) 0.5 MG/2ML nebulizer solution Take 2 mLs (0.5 mg total) by nebulization 2 (two) times daily. 120 mL 11  . Calcium & Magnesium Carbonates (MYLANTA PO) Take 1 tablet by mouth daily as needed. For indigestion.    . Cholecalciferol (VITAMIN D) 2000 UNITS tablet Take 2,000 Units by mouth daily.     . clopidogrel (PLAVIX) 75 MG  tablet Take 1 tablet (75 mg total) by mouth every morning. 90 tablet 3  . Cyanocobalamin (VITAMIN B-12 IJ) Inject as directed every 30 (thirty) days.    Marland Kitchen diltiazem (CARDIZEM CD) 240 MG 24 hr capsule Take 1 capsule (240 mg total) by mouth daily. 30 capsule 10  . doxazosin (CARDURA) 8 MG tablet Take 1 tablet (8 mg total) by mouth daily.  30 tablet 7  . famotidine (PEPCID) 20 MG tablet Take 20 mg by mouth 2 (two) times daily.    . ferrous sulfate 325 (65 FE) MG tablet Take 1 tablet (325 mg total) by mouth 3 (three) times daily after meals. 30 tablet 0  . fexofenadine (ALLEGRA) 60 MG tablet Take 1 tablet (60 mg total) by mouth 2 (two) times daily. 60 tablet 3  . formoterol (PERFOROMIST) 20 MCG/2ML nebulizer solution Take 2 mLs (20 mcg total) by nebulization 2 (two) times daily. 120 mL 11  . furosemide (LASIX) 40 MG tablet Take 40 mg by mouth every other day.     . insulin lispro protamine-insulin lispro (HUMALOG 75/25) (75-25) 100 UNIT/ML SUSP 15 in am, 15 in evening    . isosorbide mononitrate (IMDUR) 30 MG 24 hr tablet Take 1 tablet (30 mg total) by mouth daily. 31 tablet 11  . loperamide (IMODIUM) 2 MG capsule Take 2 mg by mouth 2 (two) times daily.    . meclizine (ANTIVERT) 25 MG tablet Take 25 mg by mouth as needed for dizziness (dizziness).     . metoprolol succinate (TOPROL-XL) 50 MG 24 hr tablet Take 1 tablet by mouth daily.    . montelukast (SINGULAIR) 5 MG chewable tablet Chew 1 tablet (5 mg total) by mouth at bedtime. 30 tablet 3  . Multiple Vitamin (MULTIVITAMIN WITH MINERALS) TABS Take 1 tablet by mouth daily.    . Nebulizers (COMPRESSOR/NEBULIZER) MISC Use with albuterol 1 each 0  . neomycin-polymyxin b-dexamethasone (MAXITROL) 3.5-10000-0.1 OINT Apply topically as needed    . nitroGLYCERIN (NITROSTAT) 0.4 MG SL tablet Place 0.4 mg under the tongue every 5 (five) minutes x 3 doses as needed. For chest pain.    . ONE TOUCH ULTRA TEST test strip CHECK BLOOD SUGAR TWICE A DAY DX: E11.9  3    . ONETOUCH DELICA LANCETS 89Q MISC 3 (three) times daily. for testing  0  . potassium chloride SA (K-DUR,KLOR-CON) 20 MEQ tablet Take 20 mEq by mouth 2 (two) times daily.    . prednisoLONE acetate (PRED FORTE) 1 % ophthalmic suspension Place 1 drop into the left eye 4 (four) times daily.  0  . WELCHOL 3.75 g PACK TAKE 1 PACKET BY MOUTH EVERY DAY AS DIRECTED  0  . pantoprazole (PROTONIX) 40 MG tablet Take 1 tablet (40 mg total) by mouth daily. 30 tablet 11   Current Facility-Administered Medications  Medication Dose Route Frequency Provider Last Rate Last Dose  . cyanocobalamin ((VITAMIN B-12)) injection 1,000 mcg  1,000 mcg Intramuscular Q30 days Binnie Rail, MD   1,000 mcg at 04/16/15 1520    Allergies:   Amlodipine; Banana; Co q10; Codeine; Morphine; and Sulfonamide derivatives    Social History:  The patient  reports that she quit smoking about 39 years ago. Her smoking use included Cigarettes. She started smoking about 60 years ago. She has a 10.5 pack-year smoking history. She has never used smokeless tobacco. She reports that she does not drink alcohol or use illicit drugs.   Family History:  The patient's family history includes Diabetes in her maternal grandfather and mother; Emphysema in her paternal aunt and paternal uncle; Esophageal cancer (age of onset: 6) in her brother; Heart attack in her father; Heart disease in her maternal grandfather; Heart failure in her maternal grandfather; Rheum arthritis in her sister; Stomach cancer in her father. There is no history of Neuropathy, Multiple sclerosis, or Colon cancer.    ROS:  Please see  the history of present illness.    Review of Systems: Constitutional:  denies fever, chills, diaphoresis, appetite change and fatigue.  HEENT: denies photophobia, eye pain, redness, hearing loss, ear pain, congestion, sore throat, rhinorrhea, sneezing, neck pain, neck stiffness and tinnitus.  Respiratory: denies SOB, DOE, cough, chest  tightness, and wheezing.  Cardiovascular: denies chest pain, palpitations and mild  leg swelling.  Gastrointestinal: denies nausea, vomiting, abdominal pain, diarrhea, constipation, blood in stool.  Genitourinary: denies dysuria, urgency, frequency, hematuria, flank pain and difficulty urinating.  Musculoskeletal: denies  myalgias, back pain, joint swelling, arthralgias and gait problem.   Skin: denies pallor, rash and wound.  Neurological: denies dizziness, seizures, syncope, weakness, light-headedness, numbness and headaches.   Hematological: denies adenopathy, easy bruising, personal or family bleeding history.  Psychiatric/ Behavioral: denies suicidal ideation, mood changes, confusion, nervousness, sleep disturbance and agitation.       All other systems are reviewed and negative.    PHYSICAL EXAM: VS:  BP 126/70 mmHg  Pulse 59  Ht '5\' 3"'  (1.6 m)  Wt 185 lb (83.915 kg)  BMI 32.78 kg/m2  SpO2 95% , BMI Body mass index is 32.78 kg/(m^2). GEN: Well nourished, well developed, in no acute distress HEENT: normal Neck: no JVD, carotid bruits, or masses Cardiac: RRR; no murmurs, rubs, or gallops,no edema  Respiratory:  clear to auscultation bilaterally, normal work of breathing GI: soft, nontender, nondistended, + BS MS: no deformity or atrophy Skin: warm and dry, no rash Neuro:  Strength and sensation are intact Psych: normal   EKG:  EKG is ordered today. The ekg ordered today demonstrates  NSR at 59.  RBBB.    Recent Labs: 03/31/2015: ALT 13; BUN 18; Creatinine, Ser 0.82; Hemoglobin 12.2; Magnesium 2.2; Platelets 284.0; Potassium 4.2; Sodium 142; TSH 0.58    Lipid Panel    Component Value Date/Time   CHOL 161 10/06/2011 0500   TRIG 109 10/06/2011 0500   HDL 37* 10/06/2011 0500   CHOLHDL 4.4 10/06/2011 0500   VLDL 22 10/06/2011 0500   LDLCALC 102* 10/06/2011 0500      Wt Readings from Last 3 Encounters:  06/23/15 185 lb (83.915 kg)  06/12/15 193 lb (87.544 kg)   06/04/15 195 lb (88.451 kg)      Other studies Reviewed: Additional studies/ records that were reviewed today include: . Review of the above records demonstrates:    ASSESSMENT AND PLAN:  1. Coronary artery disease-status post CABG, the saphenous vein graft to the right coronary artery is severely diseased but she has good flow to the distal right coronary artery from the native circulation. She presents with recurrent CP - different from her previous CP .   She continues to have some chest pain .   If these pains worsen, she will call back and we will get a myoview   2. Diabetes mellitus 3. Hypertension 4. Hyperlipidemia 5. COPD: June, 2013 - FEV1 equals 0.89 L which is 48% of predicted value. Her DLCO is also markedly depressed. 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   Current medicines are reviewed at length with the patient today.  The patient does not have concerns regarding medicines.  The following changes have been made:  no change  Labs/ tests ordered today include:  No orders of the defined types were placed in this encounter.     Disposition:   FU with me in 6 months       Mertie Moores, MD  06/23/2015  10:08 AM    [facility] [place], [place], [place]  [address] Phone: [phone]; Fax: [phone]   [facility]  [address] [place], [place]  [address] [phone]    Fax [phone]

## 2015-06-23 NOTE — Patient Instructions (Signed)
Medication Instructions:  Your physician recommends that you continue on your current medications as directed. Please refer to the Current Medication list given to you today. A new prescription for Imdur has been sent to your pharmacy   Labwork: None Ordered   Testing/Procedures: None Ordered   Follow-Up: Your physician wants you to follow-up in: 6 months with Dr. Acie Fredrickson.  You will receive a reminder letter in the mail two months in advance. If you don't receive a letter, please call our office to schedule the follow-up appointment.   If you need a refill on your cardiac medications before your next appointment, please call your pharmacy.   Thank you for choosing [facility]! Christen Bame, RN [phone]

## 2015-06-24 ENCOUNTER — Ambulatory Visit: Payer: Medicare Other | Admitting: Endocrinology

## 2015-07-14 ENCOUNTER — Ambulatory Visit: Payer: Medicare Other | Admitting: Gastroenterology

## 2015-07-21 ENCOUNTER — Encounter: Payer: Self-pay | Admitting: Internal Medicine

## 2015-07-21 ENCOUNTER — Ambulatory Visit (INDEPENDENT_AMBULATORY_CARE_PROVIDER_SITE_OTHER): Payer: Medicare Other | Admitting: Internal Medicine

## 2015-07-21 VITALS — BP 114/78 | Temp 98.7°F | Resp 16 | Wt 195.0 lb

## 2015-07-21 DIAGNOSIS — E109 Type 1 diabetes mellitus without complications: Secondary | ICD-10-CM | POA: Diagnosis not present

## 2015-07-21 DIAGNOSIS — R42 Dizziness and giddiness: Secondary | ICD-10-CM

## 2015-07-21 DIAGNOSIS — E538 Deficiency of other specified B group vitamins: Secondary | ICD-10-CM

## 2015-07-21 DIAGNOSIS — Z794 Long term (current) use of insulin: Secondary | ICD-10-CM

## 2015-07-21 DIAGNOSIS — I10 Essential (primary) hypertension: Secondary | ICD-10-CM

## 2015-07-21 DIAGNOSIS — R238 Other skin changes: Secondary | ICD-10-CM | POA: Diagnosis not present

## 2015-07-21 DIAGNOSIS — R233 Spontaneous ecchymoses: Secondary | ICD-10-CM

## 2015-07-21 DIAGNOSIS — E119 Type 2 diabetes mellitus without complications: Secondary | ICD-10-CM

## 2015-07-21 NOTE — Progress Notes (Signed)
Pre visit review using our clinic review tool, if applicable. No additional management support is needed unless otherwise documented below in the visit note. 

## 2015-07-21 NOTE — Progress Notes (Signed)
Subjective:    Patient ID: [name], female    DOB: 1940/05/09, 75 y.o.   MRN: [id number]  HPI She is here for follow up.  Vitamin B12 deficiency:  She is getting B12 injections monthly. She missed her injection last month and does feel tired.   Diabetes: she states today that she has type 1 diabetes, not type 2 diabetes.  She states she developed diabetes in her 38's and was put on insulin - she states she was tested and told she had type 1, not type 2.  She is taking her medication daily as prescribed. She is compliant with a diabetic diet. She is exercising regularly -water aerobics three times a week. She monitors her sugars and they have been running 80-230. She feels her sugars are not well controlled.   She checks her feet daily and denies foot lesions. She is up-to-date with an ophthalmology examination.   Skin bruising:  She has been waking up with bruising throughout her body for the past month.  They occur without trauma.    Fecal incontinence:  She saw GI.  She is taking wellchol and immodium.  There has been no improvement.  She stopped the iron.  She is still having 3-4 bowel movements a day.  She still has urgency and has bowel incontinence. She never knows when it is going to happen.    Hypertension: She is taking her medication daily. She is compliant with a low sodium diet.  She does have some chest pain and cardiology is doing some tests to determine if it is cardiac.  She  denies  palpitations, edema, shortness of breath. She is exercising regularly.       Medications and allergies reviewed with patient and updated if appropriate.  Patient Active Problem List   Diagnosis Date Noted  . Type 2 diabetes mellitus without complication, with long-term current use of insulin ([place]) 03/31/2015  . Vitamin B12 deficiency 03/31/2015  . OSA (obstructive sleep apnea) 11/20/2014  . Fecal incontinence 08/28/2014  . 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 Falls 08/28/2014    . Neck pain of over 3 months duration 08/28/2014  . H/O total knee replacement 02/20/2014  . Obesity (BMI 30-39.9) 11/12/2013  . Chronic diastolic CHF (congestive heart failure) ([place]) 11/17/2012  . Meniscus, lateral, anterior horn derangement 11/10/2011  . Medial meniscus, posterior horn derangement 11/10/2011  . Osteoarthritis of right knee 11/10/2011  . Vertigo 10/05/2011  . HTN (hypertension), benign 10/05/2011  . Coronary artery disease 08/27/2010  . MUSCLE CRAMPS 12/29/2009  . HEART ATTACK 11/19/2009  . Extrinsic asthma 11/19/2009  . GERD 11/19/2009  . HYPERLIPIDEMIA 11/18/2009    Current Outpatient Prescriptions on File Prior to Visit  Medication Sig Dispense Refill  . albuterol (PROVENTIL HFA;VENTOLIN HFA) 108 (90 BASE) MCG/ACT inhaler Inhale 2 puffs into the lungs every 6 (six) hours as needed. For shortness of breath. 1 Inhaler 0  . albuterol (PROVENTIL) (2.5 MG/3ML) 0.083% nebulizer solution Take 3 mLs (2.5 mg total) by nebulization every 6 (six) hours as needed. For shortness of breath. 360 mL 11  . ALPRAZolam (XANAX) 0.25 MG tablet Take 0.25 mg by mouth 2 (two) times daily as needed.     . baclofen (LIORESAL) 20 MG tablet Take 1 tablet by mouth 2 (two) times daily.    . Blood Glucose Monitoring Suppl (ONE TOUCH ULTRA MINI) w/Device KIT 3 (three) times daily. for testing  0  . budesonide (PULMICORT) 0.5 MG/2ML nebulizer solution Take  2 mLs (0.5 mg total) by nebulization 2 (two) times daily. 120 mL 11  . Calcium & Magnesium Carbonates (MYLANTA PO) Take 1 tablet by mouth daily as needed. For indigestion.    . Cholecalciferol (VITAMIN D) 2000 UNITS tablet Take 2,000 Units by mouth daily.     . clopidogrel (PLAVIX) 75 MG tablet Take 1 tablet (75 mg total) by mouth every morning. 90 tablet 3  . Cyanocobalamin (VITAMIN B-12 IJ) Inject as directed every 30 (thirty) days.    Marland Kitchen diltiazem (CARDIZEM CD) 240 MG 24 hr capsule Take 1 capsule (240 mg total) by mouth daily. 30 capsule 10   . doxazosin (CARDURA) 8 MG tablet Take 1 tablet (8 mg total) by mouth daily. 30 tablet 7  . famotidine (PEPCID) 20 MG tablet Take 20 mg by mouth 2 (two) times daily.    . ferrous sulfate 325 (65 FE) MG tablet Take 1 tablet (325 mg total) by mouth 3 (three) times daily after meals. 30 tablet 0  . fexofenadine (ALLEGRA) 60 MG tablet Take 1 tablet (60 mg total) by mouth 2 (two) times daily. 60 tablet 3  . formoterol (PERFOROMIST) 20 MCG/2ML nebulizer solution Take 2 mLs (20 mcg total) by nebulization 2 (two) times daily. 120 mL 11  . furosemide (LASIX) 40 MG tablet Take 40 mg by mouth every other day.     . insulin lispro protamine-insulin lispro (HUMALOG 75/25) (75-25) 100 UNIT/ML SUSP 15 in am, 15 in evening    . isosorbide mononitrate (IMDUR) 30 MG 24 hr tablet Take 1 tablet (30 mg total) by mouth daily. 31 tablet 11  . loperamide (IMODIUM) 2 MG capsule Take 2 mg by mouth 2 (two) times daily.    . meclizine (ANTIVERT) 25 MG tablet Take 25 mg by mouth as needed for dizziness (dizziness).     . metoprolol succinate (TOPROL-XL) 50 MG 24 hr tablet Take 1 tablet by mouth daily.    . montelukast (SINGULAIR) 5 MG chewable tablet Chew 1 tablet (5 mg total) by mouth at bedtime. 30 tablet 3  . Multiple Vitamin (MULTIVITAMIN WITH MINERALS) TABS Take 1 tablet by mouth daily.    . Nebulizers (COMPRESSOR/NEBULIZER) MISC Use with albuterol 1 each 0  . neomycin-polymyxin b-dexamethasone (MAXITROL) 3.5-10000-0.1 OINT Apply topically as needed    . nitroGLYCERIN (NITROSTAT) 0.4 MG SL tablet Place 0.4 mg under the tongue every 5 (five) minutes x 3 doses as needed. For chest pain.    . ONE TOUCH ULTRA TEST test strip CHECK BLOOD SUGAR TWICE A DAY DX: E11.9  3  . ONETOUCH DELICA LANCETS 19F MISC 3 (three) times daily. for testing  0  . potassium chloride SA (K-DUR,KLOR-CON) 20 MEQ tablet Take 20 mEq by mouth 2 (two) times daily.    . prednisoLONE acetate (PRED FORTE) 1 % ophthalmic suspension Place 1 drop into the  left eye 4 (four) times daily.  0  . WELCHOL 3.75 g PACK TAKE 1 PACKET BY MOUTH EVERY DAY AS DIRECTED  0  . pantoprazole (PROTONIX) 40 MG tablet Take 1 tablet (40 mg total) by mouth daily. 30 tablet 11   Current Facility-Administered Medications on File Prior to Visit  Medication Dose Route Frequency Provider Last Rate Last Dose  . cyanocobalamin ((VITAMIN B-12)) injection 1,000 mcg  1,000 mcg Intramuscular Q30 days Binnie Rail, MD   1,000 mcg at 04/16/15 1520    Past Medical History  Diagnosis Date  . Coronary artery disease   . Diabetes mellitus   .  Hypertension   . Hyperlipidemia   . Shoulder injury     resolved after shoulder surgery  . Chest pain   . Acute myocardial infarction, unspecified site, episode of care unspecified   . Unspecified asthma(493.90)   . PONV (postoperative nausea and vomiting)   . Anxiety   . GERD (gastroesophageal reflux disease)   . Upper respiratory symptom     02-13-14 "runny nose, mild cough,sniffles" -instructed to inform MD if symptoms worsens  . Schatzki's ring   . Hiatal hernia   . Adenomatous colon polyp     Past Surgical History  Procedure Laterality Date  . Cardiac catheterization  07/23/2009    EF 60%  . Cardiac catheterization  10/11/2008  . Cardiac catheterization  03/01/2007    EF 75-80%  . Coronary artery bypass graft      SEVERELY DISEASED SAPHENOUS VEIN GRAFT TO THE RIGHT CORONARY ARTERY BUT WITH FAIRLY WELL PRESERVED FLOW TO THE DISTAL RIGHT CORONARY ARTERY FROM THE NATIVE CIRCULATION-RESTART  CATH IN JUNE 2000, REVEALS MILD/MODERATE  CAD WITH GOOD FLOW DOWN HER LAD  . US echocardiography  03/08/2008    EF 55-60%  . Cardiovascular stress test  11/15/2007    EF 60%  . Rotator cuff repair      right and left  . Tumor removed kidney    . Tonsillectomy      age 21  . Abdominal hysterectomy    . Appendectomy      came out with Hysterectomy  . Eye surgery      bilateral cataract surgery with lens implant  . Total knee  arthroplasty Right 02/20/2014    Procedure: RIGHT TOTAL KNEE ARTHROPLASTY;  Surgeon: Tobi Bastos, MD;  Location: [facility];  Service: Orthopedics;  Laterality: Right;  . Colonoscopy    . Esophagogastroduodenoscopy    . Esophageal dilation    . Lense removal Left     Social History   Social History  . Marital Status: Widowed    Spouse Name: N/A  . Number of Children: 4  . Years of Education: Doctorate   Occupational History  . Retired    Social History Main Topics  . Smoking status: Former Smoker -- 0.50 packs/day for 21 years    Types: Cigarettes    Start date: 02/09/1955    Quit date: 02/09/1976  . Smokeless tobacco: Never Used  . Alcohol Use: No  . Drug Use: No  . Sexual Activity: Not on file   Other Topics Concern  . Not on file   Social History Narrative   Lives alone.   Caffeine use: Drinks 1 cup coffee/day      Originally from [place]. Previously has lived in [place]. Prior travel to [place], [place], [place], [place], [place], [place], [place], & [place]. Previously worked in [employer] She has a dog currently. No bird, mold, or hot tub exposure. She also pastors a church.     Family History  Problem Relation Age of Onset  . Emphysema Paternal Uncle   . Heart disease Maternal Grandfather   . Heart failure Maternal Grandfather   . Esophageal cancer Brother 42    she said he was born with it  . Heart attack Father   . Stomach cancer Father   . Neuropathy Neg Hx   . Multiple sclerosis Neg Hx   . Rheum arthritis Sister   . Emphysema Paternal Aunt   . Colon cancer Neg Hx   . Diabetes Mother   . Diabetes  Maternal Grandfather     Review of Systems  Constitutional: Positive for fatigue.  Respiratory: Positive for wheezing (related to asthma). Negative for cough and shortness of breath.   Cardiovascular: Positive for chest pain (saw cardiology - having tests). Negative for palpitations and leg swelling.  Gastrointestinal: Negative for abdominal pain.       Increased  bowels, urgency and incontinece  Genitourinary: Negative for dysuria and hematuria.       No urinary incontinence  Neurological: Positive for dizziness (daily) and headaches (daily).       Objective:   Filed Vitals:   07/21/15 1409  BP: 114/78  Temp: 98.7 F (37.1 C)  Resp: 16   Filed Weights   07/21/15 1409  Weight: 195 lb (88.451 kg)   Body mass index is 34.55 kg/(m^2).   Physical Exam Constitutional: Appears well-developed and well-nourished. No distress.  Neck: Neck supple. No tracheal deviation present. No thyromegaly present.  No carotid bruit. No cervical adenopathy.   Cardiovascular: Normal rate, regular rhythm and normal heart sounds.   No murmur heard.  No edema Pulmonary/Chest: Effort normal and breath sounds normal. No respiratory distress. No wheezes.  Skin: b/l bruises on arms and legs - small, non-tender       Assessment & Plan:   See Problem List for Assessment and Plan of chronic medical problems.  F/u in 4-5 months

## 2015-07-21 NOTE — Assessment & Plan Note (Signed)
?   Type 1 Has seen Dr Loanne Drilling once but would like to see a different endocrinologist - will refer Sugar at home are variable - 80-230 over the past month Exercising, compliant with a diabetic diet Check a1c

## 2015-07-21 NOTE — Patient Instructions (Signed)
  Test(s) ordered today. Your results will be released to [place] (or called to you) after review, usually within 72hours after test completion. If any changes need to be made, you will be notified at that same time.  A B12 injection was scheduled.   Medications reviewed and updated.  No changes recommended at this time.  Your prescription(s) have been submitted to your pharmacy. Please take as directed and contact our office if you believe you are having problem(s) with the medication(s).   Please followup in 4-5 months

## 2015-07-21 NOTE — Assessment & Plan Note (Addendum)
Daily Worse with head movements - ? BPPV Taking meclizine daily Has another appointment with neuro to discuss

## 2015-07-21 NOTE — Assessment & Plan Note (Signed)
Check iron and cbc

## 2015-07-21 NOTE — Assessment & Plan Note (Signed)
BP well controlled Current regimen effective and well tolerated Continue current medications at current doses  

## 2015-07-21 NOTE — Assessment & Plan Note (Signed)
Monthly B12 injections  One today

## 2015-08-04 ENCOUNTER — Telehealth: Payer: Self-pay | Admitting: Gastroenterology

## 2015-08-04 NOTE — Telephone Encounter (Signed)
Pt continues to have diarrhea and cancelled f/u for 07/14/15.  She is taking welchol, and imodium TID as well as following the Fodmap diet.  She has an appt to f/u with Dr Fuller Plan on 08/18/15.

## 2015-08-18 ENCOUNTER — Other Ambulatory Visit (INDEPENDENT_AMBULATORY_CARE_PROVIDER_SITE_OTHER): Payer: Medicare Other

## 2015-08-18 ENCOUNTER — Encounter: Payer: Self-pay | Admitting: Gastroenterology

## 2015-08-18 ENCOUNTER — Telehealth: Payer: Self-pay | Admitting: Neurology

## 2015-08-18 ENCOUNTER — Telehealth: Payer: Self-pay

## 2015-08-18 ENCOUNTER — Ambulatory Visit (INDEPENDENT_AMBULATORY_CARE_PROVIDER_SITE_OTHER): Payer: Medicare Other | Admitting: Gastroenterology

## 2015-08-18 VITALS — BP 120/68 | HR 80 | Ht 63.0 in | Wt 198.8 lb

## 2015-08-18 DIAGNOSIS — R159 Full incontinence of feces: Secondary | ICD-10-CM

## 2015-08-18 DIAGNOSIS — R197 Diarrhea, unspecified: Secondary | ICD-10-CM

## 2015-08-18 LAB — IGA: IGA: 267 mg/dL (ref 68–378)

## 2015-08-18 MED ORDER — NA SULFATE-K SULFATE-MG SULF 17.5-3.13-1.6 GM/177ML PO SOLN: 1.0000 | Freq: Once | ORAL | Status: DC

## 2015-08-18 NOTE — Telephone Encounter (Signed)
Dr Jaynee Eagles- pt was scheduled for f/u by phone staff. Do you want to do anything further?

## 2015-08-18 NOTE — Telephone Encounter (Signed)
The patient may hold the Plavix for 5 days prior to endoscopy procedure

## 2015-08-18 NOTE — Progress Notes (Signed)
    History of Present Illness: This is a 68 old female returning for follow-up of persistent diarrhea. She is currently taking Imodium 4 times daily was taking WelChol once daily. On this regimen she was having 4 loose bowel movements per day with occasional episodes of fecal incontinence. Her WelChol prescription ran out and she did not renew it and her diarrhea has not changed. She notes worsening of her diarrhea with runoff fruits and she has long-term lactose intolerance and avoids all lactose products.  Current Medications, Allergies, Past Medical History, Past Surgical History, Family History and Social History were reviewed in [employer] record.  Physical Exam: General: Well developed, well nourished, no acute distress Head: Normocephalic and atraumatic Eyes:  sclerae anicteric, EOMI Ears: Normal auditory acuity Mouth: No deformity or lesions Lungs: Clear throughout to auscultation Heart: Regular rate and rhythm; no murmurs, rubs or bruits Abdomen: Soft, non tender and non distended. No masses, hepatosplenomegaly or hernias noted. Normal Bowel sounds Rectal: deferred to colonoscopy Musculoskeletal: Symmetrical with no gross deformities  Pulses:  Normal pulses noted Extremities: No clubbing, cyanosis, edema or deformities noted Neurological: Alert oriented x 4, grossly nonfocal Psychological:  Alert and cooperative. Normal mood and affect  Assessment and Recommendations:  1. Diarrhea. Fecal incontinence. Lactose intolerance. R/O infection, IBD, microscopic colitis, pancreatic insufficiency, celiac disease. Avoid all lactose products and any foods/beverages that exacerbate diarrhea. Increase Imodium to 2 po qid prn. Obtain a GI pathogen panel, fecal elastase, tTG, IgA, begin FODMAP diet and schedule colonoscopy. The risks (including bleeding, perforation, infection, missed lesions, medication reactions and possible hospitalization or surgery if complications  occur), benefits, and alternatives to colonoscopy with possible biopsy and possible polypectomy were discussed with the patient and they consent to proceed.   2. Chronic antiplatelet therapy. Hold Plavix 5 days before procedure - will instruct when and how to resume after procedure. Low but real risk of cardiovascular event such as heart attack, stroke, embolism, thrombosis or ischemia/infarct of other organs off Plavix explained and need to seek urgent help if this occurs. The patient consents to proceed. Will communicate by phone or EMR with patient's prescribing provider to confirm that holding Plavix is reasonable in this case.

## 2015-08-18 NOTE — Telephone Encounter (Signed)
Patient reports worsening of migraines that started 5 months ago, having headaches every day, headache all night last night, dizzy, balance is off, falling, face is sensitive, terrible pain in neck, An appointment was made for July 13th with Dr. Jaynee Eagles.

## 2015-08-18 NOTE — Telephone Encounter (Signed)
  08/18/2015   RE: [name] POGGI DOB: February 01, 1941 MRN: [id number]   Dear Dr. Acie Fredrickson,    We have scheduled the above patient for an endoscopic procedure. Our records show that she is on anticoagulation therapy.   Please advise as to how long the patient may come off her therapy of Plavix prior to the procedure, which is scheduled for 09/10/15.  Please route your answer to Marlon Pel, [place].  Sincerely,    Marlon Pel, CMA

## 2015-08-18 NOTE — Patient Instructions (Signed)
Your physician has requested that you go to the basement for  lab work before leaving today.  You have been scheduled for a colonoscopy. Please follow written instructions given to you at your visit today.  Please pick up your prep supplies at the pharmacy within the next 1-3 days. If you use inhalers (even only as needed), please bring them with you on the day of your procedure. Your physician has requested that you go to www.startemmi.com and enter the access code given to you at your visit today. This web site gives a general overview about your procedure. However, you should still follow specific instructions given to you by our office regarding your preparation for the procedure.  You have been given a [place] diet to follow.   Increase your Imodium to 2 capsules by mouth every 6 hours as needed for diarrhea.  Thank you for choosing me and [facility].  Pricilla Riffle. Dagoberto Ligas., MD., Marval Regal

## 2015-08-18 NOTE — Telephone Encounter (Signed)
Patient notified per Dr. Elmarie Shiley recommendations to hold Plavix 5 days prior to her procedure. Patient verbalized understanding.

## 2015-08-19 ENCOUNTER — Other Ambulatory Visit: Payer: Self-pay | Admitting: Gastroenterology

## 2015-08-19 ENCOUNTER — Other Ambulatory Visit: Payer: Medicare Other

## 2015-08-19 DIAGNOSIS — R159 Full incontinence of feces: Secondary | ICD-10-CM

## 2015-08-19 DIAGNOSIS — R197 Diarrhea, unspecified: Secondary | ICD-10-CM

## 2015-08-19 LAB — TISSUE TRANSGLUTAMINASE, IGA: TISSUE TRANSGLUTAMINASE AB, IGA: 1 U/mL (ref <4)

## 2015-08-20 ENCOUNTER — Ambulatory Visit (INDEPENDENT_AMBULATORY_CARE_PROVIDER_SITE_OTHER): Payer: Medicare Other

## 2015-08-20 ENCOUNTER — Telehealth: Payer: Self-pay | Admitting: Cardiovascular Disease

## 2015-08-20 ENCOUNTER — Other Ambulatory Visit: Payer: Self-pay

## 2015-08-20 ENCOUNTER — Encounter: Payer: Self-pay | Admitting: Endocrinology

## 2015-08-20 DIAGNOSIS — E538 Deficiency of other specified B group vitamins: Secondary | ICD-10-CM

## 2015-08-20 MED ORDER — CYANOCOBALAMIN 1000 MCG/ML IJ SOLN
1000.0000 ug | Freq: Once | INTRAMUSCULAR | Status: AC
  Administered 2015-08-20: 1000 ug via INTRAMUSCULAR

## 2015-08-20 MED ORDER — DOXAZOSIN MESYLATE 8 MG PO TABS: 8.0000 mg | ORAL_TABLET | Freq: Every day | ORAL | Status: DC

## 2015-08-20 NOTE — Telephone Encounter (Signed)
New message       *STAT* If patient is at the pharmacy, call can be transferred to refill team.   1. Which medications need to be refilled? (please list name of each medication and dose if known) Doxazosin 8 mg po daily  2. Which pharmacy/location (including street and city if local pharmacy) is medication to be sent to?[facility] [address]  3. Do they need a 30 day or 90 day supply? 30 day

## 2015-08-21 ENCOUNTER — Ambulatory Visit (INDEPENDENT_AMBULATORY_CARE_PROVIDER_SITE_OTHER): Payer: Medicare Other | Admitting: Neurology

## 2015-08-21 ENCOUNTER — Encounter: Payer: Self-pay | Admitting: Neurology

## 2015-08-21 VITALS — BP 151/72 | HR 63 | Ht 63.0 in | Wt 199.2 lb

## 2015-08-21 DIAGNOSIS — M542 Cervicalgia: Secondary | ICD-10-CM

## 2015-08-21 DIAGNOSIS — M5030 Other cervical disc degeneration, unspecified cervical region: Secondary | ICD-10-CM | POA: Diagnosis not present

## 2015-08-21 MED ORDER — CYCLOBENZAPRINE HCL 5 MG PO TABS: 5.0000 mg | ORAL_TABLET | Freq: Every day | ORAL | Status: DC

## 2015-08-21 NOTE — Patient Instructions (Addendum)
Overall you are doing fairly well but I do want to suggest a few things today:   Remember to drink plenty of fluid, eat healthy meals and do not skip any meals. Try to eat protein with a every meal and eat a healthy snack such as fruit or nuts in between meals. Try to keep a regular sleep-wake schedule and try to exercise daily, particularly in the form of walking, 20-30 minutes a day, if you can.   As far as your medications are concerned, I would like to suggest: Flexeril before bedtime  As far as diagnostic testing: Needs injections with dr. Nelva Bush   Our phone number is [phone]. We also have an after hours call service for urgent matters and there is a physician on-call for urgent questions. For any emergencies you know to call 911 or go to the nearest emergency room  Cyclobenzaprine tablets What is this medicine? CYCLOBENZAPRINE (sye kloe BEN za preen) is a muscle relaxer. It is used to treat muscle pain, spasms, and stiffness. This medicine may be used for other purposes; ask your health care provider or pharmacist if you have questions. What should I tell my health care provider before I take this medicine? They need to know if you have any of these conditions: -heart disease, irregular heartbeat, or previous heart attack -liver disease -thyroid problem -an unusual or allergic reaction to cyclobenzaprine, tricyclic antidepressants, lactose, other medicines, foods, dyes, or preservatives -pregnant or trying to get pregnant -breast-feeding How should I use this medicine? Take this medicine by mouth with a glass of water. Follow the directions on the prescription label. If this medicine upsets your stomach, take it with food or milk. Take your medicine at regular intervals. Do not take it more often than directed. Talk to your pediatrician regarding the use of this medicine in children. Special care may be needed. Overdosage: If you think you have taken too much of this medicine  contact a poison control center or emergency room at once. NOTE: This medicine is only for you. Do not share this medicine with others. What if I miss a dose? If you miss a dose, take it as soon as you can. If it is almost time for your next dose, take only that dose. Do not take double or extra doses. What may interact with this medicine? Do not take this medicine with any of the following medications: -certain medicines for fungal infections like fluconazole, itraconazole, ketoconazole, posaconazole, voriconazole -cisapride -dofetilide -dronedarone -droperidol -flecainide -grepafloxacin -halofantrine -levomethadyl -MAOIs like Carbex, Eldepryl, Marplan, Nardil, and Parnate -nilotinib -pimozide -probucol -sertindole -thioridazine -ziprasidone This medicine may also interact with the following medications: -abarelix -alcohol -certain medicines for cancer -certain medicines for depression, anxiety, or psychotic disturbances -certain medicines for infection like alfuzosin, chloroquine, clarithromycin, levofloxacin, mefloquine, pentamidine, troleandomycin -certain medicines for an irregular heart beat -certain medicines used for sleep or numbness during surgery or procedure -contrast dyes -dolasetron -guanethidine -methadone -octreotide -ondansetron -other medicines that prolong the QT interval (cause an abnormal heart rhythm) -palonosetron -phenothiazines like chlorpromazine, mesoridazine, prochlorperazine, thioridazine -tramadol -vardenafil This list may not describe all possible interactions. Give your health care provider a list of all the medicines, herbs, non-prescription drugs, or dietary supplements you use. Also tell them if you smoke, drink alcohol, or use illegal drugs. Some items may interact with your medicine. What should I watch for while using this medicine? Check with your doctor or health care professional if your condition does not improve within 1 to 3  weeks. You may get drowsy or dizzy when you first start taking the medicine or change doses. Do not drive, use machinery, or do anything that may be dangerous until you know how the medicine affects you. Stand or sit up slowly. Your mouth may get dry. Drinking water, chewing sugarless gum, or sucking on hard candy may help. What side effects may I notice from receiving this medicine? Side effects that you should report to your doctor or health care professional as soon as possible: -allergic reactions like skin rash, itching or hives, swelling of the face, lips, or tongue -chest pain -fast heartbeat -hallucinations -seizures -vomiting Side effects that usually do not require medical attention (report to your doctor or health care professional if they continue or are bothersome): -headache This list may not describe all possible side effects. Call your doctor for medical advice about side effects. You may report side effects to FDA at 1-800-FDA-1088. Where should I keep my medicine? Keep out of the reach of children. Store at room temperature between 15 and 30 degrees C (59 and 86 degrees F). Keep container tightly closed. Throw away any unused medicine after the expiration date. NOTE: This sheet is a summary. It may not cover all possible information. If you have questions about this medicine, talk to your doctor, pharmacist, or health care provider.    2016, Elsevier/Gold Standard. (2012-08-22 12:48:19)

## 2015-08-21 NOTE — Progress Notes (Addendum)
[id number]    Provider:  Dr Jaynee Eagles Referring Provider: Binnie Rail, MD Primary Care Physician:  Binnie Rail, MD  CC: Gait dysfunction  Addendum: patient saw dr Nelva Bush who agreed pain in the neck is arthritic. He cannot provide injections due to her plavix and also due to steroids in the injection swhich can exacerbate her insulin-dependent diabetes. Her plavix interferes with using arthritic drugs per his note. He recommended extra strength tylenol. Tramadol and codeine makes her sick. He does not have anything else to offer her.   Interval history 08/21/2015 : Haven't seen patient since 2016. I sent her for sleep eval and she was diagnosed with sleep apnea. She may have something wrong with her machine. The pain is in the neck on the right side and goes down her neck and weakness in the right hand. The neck pain for at least a year. She has neck pain that wakes her up from sleep. She has some weakness in the right arm and hand which pre-dates the last MRI of the c-spine so repeat not needed, no new symptoms, no red flags, no changes in bowel or bladder. The pain radiates to the front. Mri of the brain last year was normal for age. 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 Will give her some flexeril at night and send for injections to Dr. Nelva Bush for pain procedure. Needs injections. Musculoskeletal neck pain on the right, palpation of the right trapezius duplicates the pain.    Reviewed MRI of the cervical spine with patient:  FINDINGS: : On sagittal images, the spine is imaged from above the cervicomedullary junction to T2-T3. The spinal cord is of normal caliber and signal. There is minimal anterolisthesis of C4 upon C5 and C5 upon C6. Degenerative changes also noted at C6-C7 with loss of disc height. The vertebral bodies have normal signal. The discs and interspaces were  further evaluated on axial views from C2 to T2 as follows: C2 - C3: The disc and interspace appear normal. C3 - C4: There is left greater than right uncovertebral spurring and disc bulging. There is moderate left and mild right foraminal narrowing. Although there is no definite nerve root compression there is some encroachment upon the exiting left C4 nerve root.. C4 - C5: There is severe right and minimal left uncovertebral spurring. There is facet hypertrophy likely causing the mild anterolisthesis. This causes moderate foraminal narrowing to the right that leads to encroachment upon the right C5 nerve root. There is only mild foraminal narrowing to the left.. C5 - C6: There is minimal uncovertebral spurring and mild facet hypertrophy, likely contributing to the mild anterolisthesis. The neural foramina are mildly narrowed but there does not appear to be any nerve root impingement.. C6 - C7: There is moderately severe loss of disc height associated with mild disc bulging and mild uncovertebral spurring. The neural foramina are moderately narrowed bilaterally. There does not appear to be nerve root compression though there is some encroachment upon the exiting C7 nerve roots.. C7 - T1: The disc and interspace appear normal. T1 - T2: The disc and interspace appear normal  Compared to the MRI of the cervical spine dated 08/12/2004, there has been mild progression of the anterolisthesis at C4-C5 and C5-C6. Degenerative changes at C3-C4 and C6-C7 are similar.  IMPRESSION: This is an abnormal MRI of the cervical spine showing multilevel degenerative changes as detailed  above. The most significant findings are: 1. Stable left greater than right uncovertebral spurring at C3-C4 causing moderate left foraminal narrowing is no nerve root compression but there is some encroachment upon the exiting left C4 nerve root.  2. Mild anterolisthesis at C4-C5 associated with mild facet hypertrophy. There is  severe right uncovertebral spurring causing moderate foraminal narrowing. There is no definite nerve root compression though there is some encroachment upon the right C5 nerve root. The anterolisthesis has progressed slightly when compared to 2006. 3. Mild anterolisthesis of C5-C6 associated with mild facet hypertrophy. There does not appear to be nerve root impingement at this level. The anterolisthesis has progressed slightly when compared to 2006. 4. Disc bulging and uncovertebral spurring associated with moderately severe loss of disc height at C6-C7. There is moderate bilateral foraminal narrowing. Although there is no definite nerve root compression, there is some encroachment upon the exiting C7 nerve roots.   HPI: [name] is a 75 y.o. female here as a referral from Dr. Karlton Lemon for gait dysfunction and fecal incontinence. PMHx of NASH, DM, hld, obesity, b12 deficiency, HTN.   She is having gait dysfunction, imbalance and bowel incontinence. She has a bad knee and had surgery on the knee but the walking problem happened before then. She had an episode with vertigo a few weeks ago and she never really recovered. She still has episodic vertigo. Worse when she doesn't get enough sleep, she has room spinning, objects moving when she is dizzy. But that doesn't happen often. She feels like she does not have balance. She fell 2 weeks ago. She says her knee gave way on the first fall. The second fall she also felt like the right knee gave out. Last week, she got up to get the phone and fell flat on her face. She is supposed to be using a walking aid and has not been using it. She does not have a walking aid with her today, she left it in the car. She denies back pain. She is having bowel incontinence, started last year. She has not been to gastrtroenterology. The stool is just there, she doesn't even know she is having leakage. She will find soft, formed stool in her underwear. She has 3-4 bowel  movements a day and if she is regular she is fine, but if she miss a bowel movement in the morning she may have bowel incontinence. No pain, no urge, happens every day. She wakes up every single day with a headache. Not excessively tired during the day. No numbness or tinging in the toes. No LBP or radicular symptoms. She has neck pain. Denies new medications or laxatives.    Reviewed notes, labs and imaging from outside physicians, which showed; patient referred due to suspicion of neurologic cause of fecal incontinence.   MRI/MRA brain and head 2013 (personally reviewed images): IMPRESSION: 1. Difficult to exclude two tiny acute or subacute cortically based infarcts in the bilateral parietal lobes (see series 4 images 21 and 24) although these could be image noise. 2. Otherwise no acute intracranial abnormality and stable/negative noncontrast MRI appearance of the brain.  IMPRESSION: 1. 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 2. Otherwise negative intracranial MRA which is intermittently degraded by motion.  BUN 17, creatinine 0.71 07/30/2014 lfts unremarkable ldl 138 hgba1c 6.8  Review of Systems: Patient complains of symptoms per HPI as well as the following symptoms: no CP, no SOB.  Pertinent negatives per HPI. All others negative.   Social History   Social History  . Marital Status: Widowed    Spouse Name: N/A  . Number of Children: 4  . Years of Education: Doctorate   Occupational History  . Retired    Social History Main Topics  . Smoking status: Former Smoker -- 0.50 packs/day for 21 years    Types: Cigarettes    Start date: 02/09/1955    Quit date: 02/09/1976  . Smokeless tobacco: Never Used  . Alcohol Use: No  . Drug Use: No  . Sexual Activity: Not on file   Other Topics Concern  . Not on file   Social History Narrative   Lives alone.   Caffeine use: Drinks 1 cup coffee/day        Originally from [place]. Previously has lived in [place]. Prior travel to [place], [place], [place], [place], [place], [place], [place], & [place]. Previously worked in [employer] She has a dog currently. No bird, mold, or hot tub exposure. She also pastors a church.     Family History  Problem Relation Age of Onset  . Emphysema Paternal Uncle   . Heart disease Maternal Grandfather   . Heart failure Maternal Grandfather   . Esophageal cancer Brother 20    she said he was born with it  . Heart attack Father   . Stomach cancer Father   . Neuropathy Neg Hx   . Multiple sclerosis Neg Hx   . Rheum arthritis Sister   . Emphysema Paternal Aunt   . Colon cancer Neg Hx   . Diabetes Mother   . Diabetes Maternal Grandfather     Past Medical History  Diagnosis Date  . Coronary artery disease   . Diabetes mellitus   . Hypertension   . Hyperlipidemia   . Shoulder injury     resolved after shoulder surgery  . Chest pain   . Acute myocardial infarction, unspecified site, episode of care unspecified   . Unspecified asthma(493.90)   . PONV (postoperative nausea and vomiting)   . Anxiety   . GERD (gastroesophageal reflux disease)   . Upper respiratory symptom     02-13-14 "runny nose, mild cough,sniffles" -instructed to inform MD if symptoms worsens  . Schatzki's ring   . Hiatal hernia   . Adenomatous colon polyp     Past Surgical History  Procedure Laterality Date  . Cardiac catheterization  07/23/2009    EF 60%  . Cardiac catheterization  10/11/2008  . Cardiac catheterization  03/01/2007    EF 75-80%  . Coronary artery bypass graft      SEVERELY DISEASED SAPHENOUS VEIN GRAFT TO THE RIGHT CORONARY ARTERY BUT WITH FAIRLY WELL PRESERVED FLOW TO THE DISTAL RIGHT CORONARY ARTERY FROM THE NATIVE CIRCULATION-RESTART  CATH IN JUNE 2000, REVEALS MILD/MODERATE  CAD WITH GOOD FLOW DOWN HER LAD  . US echocardiography  03/08/2008    EF 55-60%  . Cardiovascular stress test  11/15/2007    EF 60%  .  Rotator cuff repair      right and left  . Tumor removed kidney    . Tonsillectomy      age 47  . Abdominal hysterectomy    . Appendectomy      came out with Hysterectomy  . Eye surgery      bilateral cataract surgery with lens implant  . Total knee arthroplasty Right 02/20/2014    Procedure: RIGHT TOTAL KNEE ARTHROPLASTY;  Surgeon: Tobi Bastos,  MD;  Location: [facility];  Service: Orthopedics;  Laterality: Right;  . Colonoscopy    . Esophagogastroduodenoscopy    . Esophageal dilation    . Lense removal Left     Current Outpatient Prescriptions  Medication Sig Dispense Refill  . albuterol (PROVENTIL HFA;VENTOLIN HFA) 108 (90 BASE) MCG/ACT inhaler Inhale 2 puffs into the lungs every 6 (six) hours as needed. For shortness of breath. 1 Inhaler 0  . albuterol (PROVENTIL) (2.5 MG/3ML) 0.083% nebulizer solution Take 3 mLs (2.5 mg total) by nebulization every 6 (six) hours as needed. For shortness of breath. 360 mL 11  . ALPRAZolam (XANAX) 0.25 MG tablet Take 0.25 mg by mouth 2 (two) times daily as needed.     . baclofen (LIORESAL) 20 MG tablet Take 1 tablet by mouth 2 (two) times daily.    . Blood Glucose Monitoring Suppl (ONE TOUCH ULTRA MINI) w/Device KIT 3 (three) times daily. for testing  0  . budesonide (PULMICORT) 0.5 MG/2ML nebulizer solution Take 2 mLs (0.5 mg total) by nebulization 2 (two) times daily. 120 mL 11  . Calcium & Magnesium Carbonates (MYLANTA PO) Take 1 tablet by mouth daily as needed. For indigestion.    . Cholecalciferol (VITAMIN D) 2000 UNITS tablet Take 2,000 Units by mouth daily.     . clopidogrel (PLAVIX) 75 MG tablet Take 1 tablet (75 mg total) by mouth every morning. 90 tablet 3  . Cyanocobalamin (VITAMIN B-12 IJ) Inject as directed every 30 (thirty) days.    Marland Kitchen diltiazem (CARDIZEM CD) 240 MG 24 hr capsule Take 1 capsule (240 mg total) by mouth daily. 30 capsule 10  . doxazosin (CARDURA) 8 MG tablet Take 1 tablet (8 mg total) by mouth daily. 30 tablet 6  .  famotidine (PEPCID) 20 MG tablet Take 20 mg by mouth 2 (two) times daily.    . fexofenadine (ALLEGRA) 60 MG tablet Take 1 tablet (60 mg total) by mouth 2 (two) times daily. 60 tablet 3  . formoterol (PERFOROMIST) 20 MCG/2ML nebulizer solution Take 2 mLs (20 mcg total) by nebulization 2 (two) times daily. 120 mL 11  . furosemide (LASIX) 40 MG tablet Take 40 mg by mouth every other day.     . insulin lispro protamine-insulin lispro (HUMALOG 75/25) (75-25) 100 UNIT/ML SUSP 18 in am, 27 in evening    . isosorbide mononitrate (IMDUR) 30 MG 24 hr tablet Take 1 tablet (30 mg total) by mouth daily. 31 tablet 11  . loperamide (IMODIUM) 2 MG capsule Take 2 mg by mouth 2 (two) times daily.    . meclizine (ANTIVERT) 25 MG tablet Take 25 mg by mouth as needed for dizziness (dizziness).     . metoprolol succinate (TOPROL-XL) 50 MG 24 hr tablet Take 1 tablet by mouth daily.    . montelukast (SINGULAIR) 5 MG chewable tablet Chew 1 tablet (5 mg total) by mouth at bedtime. 30 tablet 3  . Multiple Vitamin (MULTIVITAMIN WITH MINERALS) TABS Take 1 tablet by mouth daily.    . Na Sulfate-K Sulfate-Mg Sulf 17.5-3.13-1.6 GM/180ML SOLN Take 1 kit by mouth once. 354 mL 0  . Nebulizers (COMPRESSOR/NEBULIZER) MISC Use with albuterol 1 each 0  . neomycin-polymyxin b-dexamethasone (MAXITROL) 3.5-10000-0.1 OINT Apply topically as needed    . nitroGLYCERIN (NITROSTAT) 0.4 MG SL tablet Place 0.4 mg under the tongue every 5 (five) minutes x 3 doses as needed. For chest pain.    . ONE TOUCH ULTRA TEST test strip CHECK BLOOD SUGAR TWICE A DAY  DX: E11.9  3  . ONETOUCH DELICA LANCETS 62B MISC 3 (three) times daily. for testing  0  . potassium chloride SA (K-DUR,KLOR-CON) 20 MEQ tablet Take 20 mEq by mouth 2 (two) times daily.    . prednisoLONE acetate (PRED FORTE) 1 % ophthalmic suspension Place 1 drop into the left eye 4 (four) times daily.  0  . pantoprazole (PROTONIX) 40 MG tablet Take 1 tablet (40 mg total) by mouth daily. 30  tablet 11   Current Facility-Administered Medications  Medication Dose Route Frequency Provider Last Rate Last Dose  . cyanocobalamin ((VITAMIN B-12)) injection 1,000 mcg  1,000 mcg Intramuscular Q30 days Binnie Rail, MD   1,000 mcg at 07/21/15 1530    Allergies as of 08/21/2015 - Review Complete 08/21/2015  Allergen Reaction Noted  . Amlodipine  08/20/2010  . Banana Nausea And Vomiting 11/01/2011  . Co q10 [coenzyme q10]    . Codeine    . Morphine Other (See Comments)   . Sulfonamide derivatives Swelling     Vitals: BP 151/72 mmHg  Pulse 63  Ht '5\' 3"'  (1.6 m)  Wt 199 lb 3.2 oz (90.357 kg)  BMI 35.30 kg/m2 Last Weight:  Wt Readings from Last 1 Encounters:  08/21/15 199 lb 3.2 oz (90.357 kg)   Last Height:   Ht Readings from Last 1 Encounters:  08/21/15 '5\' 3"'  (1.6 m)    Neuro: Detailed Neurologic Exam  Speech:  Speech is normal; fluent and spontaneous with normal comprehension.  Cognition:  The patient is oriented to person, place, and time;   recent and remote memory intact;   language fluent;   normal attention, concentration,   fund of knowledge Cranial Nerves:  The pupils are equal, round, and reactive to light.  Visual fields are full to finger confrontation. Extraocular movements are intact. Trigeminal sensation is intact and the muscles of mastication are normal. The face is symmetric. The palate elevates in the midline. Hearing intact. Voice is normal. Shoulder shrug is normal. The tongue has normal motion without fasciculations.   Coordination:  No dysmetria  Gait:  Not using walking aid, not shuffling, not significantly ataxic  Motor Observation:  No asymmetry, no atrophy, and no involuntary movements noted. Tone:  Normal muscle tone.   Posture:  Posture is normal.    Strength:  Strength is V/V in the upper and lower limbs.    Sensation: intact to LT. Romberg negative.  Intact to pp, temp distally. Few  seconds vibration at the toes and mild impaired proprioception at the great toes.   Reflex Exam:  DTR's:  Deep tendon reflexes in the lowers are hyporeflexic, uppers symmetric and within normal limits Toes:  Left equiv, right down.  Clonus:  Clonus is absent.      Assessment/Plan: 75 year old female with neck pain  Discussed neck pain at length. MRI cervical spine showed multilevel degenerative changes as detailed in report and in the HPI. Gave her flexeril, discussed side effects as detailed in instructions, don;t take with baclofen due to increased risk of sedation. Advised to be compliant with cpap and follow up with sleep doctors. Needs pain procedures possibly epidural steroid injections for her neck pain will refer to Dr. Nelva Bush.    Sarina Ill, MD  [facility] [address] [place] [employer], [place] [address]  Phone [phone] Fax [phone]   A total of 30 minutes was spent face-to-face with this patient. Over half this time was spent on counseling patient on the  neck pain and degenerative cervical disease diagnosis and different diagnostic and therapeutic options available.

## 2015-08-25 LAB — GASTROINTESTINAL PATHOGEN PANEL PCR

## 2015-08-25 LAB — OTHER SOLSTAS TEST

## 2015-08-27 ENCOUNTER — Other Ambulatory Visit: Payer: Self-pay

## 2015-08-27 LAB — PANCREATIC ELASTASE, FECAL: PANCREATIC ELASTASE-1, STL: 23 ug/g — AB

## 2015-08-27 MED ORDER — PANCRELIPASE (LIP-PROT-AMYL) 24000-76000 UNITS PO CPEP: 2.0000 | ORAL_CAPSULE | Freq: Three times a day (TID) | ORAL | Status: DC

## 2015-09-02 ENCOUNTER — Telehealth: Payer: Self-pay | Admitting: Gastroenterology

## 2015-09-02 NOTE — Telephone Encounter (Signed)
See lab results on pancreatic fecal elastase for additional details.

## 2015-09-03 ENCOUNTER — Telehealth: Payer: Self-pay | Admitting: Pulmonary Disease

## 2015-09-03 ENCOUNTER — Encounter: Payer: Self-pay | Admitting: Pulmonary Disease

## 2015-09-03 ENCOUNTER — Ambulatory Visit (INDEPENDENT_AMBULATORY_CARE_PROVIDER_SITE_OTHER): Payer: Medicare Other | Admitting: Pulmonary Disease

## 2015-09-03 VITALS — BP 132/82 | HR 87 | Ht 63.0 in | Wt 198.2 lb

## 2015-09-03 DIAGNOSIS — Z23 Encounter for immunization: Secondary | ICD-10-CM | POA: Diagnosis not present

## 2015-09-03 DIAGNOSIS — K219 Gastro-esophageal reflux disease without esophagitis: Secondary | ICD-10-CM | POA: Diagnosis not present

## 2015-09-03 DIAGNOSIS — J4550 Severe persistent asthma, uncomplicated: Secondary | ICD-10-CM

## 2015-09-03 DIAGNOSIS — J309 Allergic rhinitis, unspecified: Secondary | ICD-10-CM

## 2015-09-03 MED ORDER — FEXOFENADINE HCL 30 MG PO TABS: ORAL_TABLET | ORAL | 6 refills | Status: DC

## 2015-09-03 NOTE — Telephone Encounter (Signed)
Interesting. They must just not carry the 30mg . Go ahead and switch her over to 180mg  1 tablet daily #30 with 6 refills. Thanks.

## 2015-09-03 NOTE — Progress Notes (Signed)
Subjective:    Patient ID: [name], female    DOB: 07/11/1940, 75 y.o.   MRN: [id number]  C.C.:  Follow-up for Severe, Persistent Asthma w/ Fixed Airway Obstruction, Allergic Rhinitis, GERD, & Mild-Moderate OSA.  HPI  Severe, Persistent Asthma w/ Fixed Airway Obstruction:  Prescribed Perforomist and Pulmicort. She reports she has noticed increased dyspnea with the increased heat & humidity. She reports she is unable to afford her rescue inhaler. She reports she is able to get her albuterol to use as a rescue with her nebulizer. She does wake up at night to go to the bathroom. She reports she is wheezing in the morning when she gets up. No exacerbations cine last appointment.   Allergic Rhinitis:  Previously prescribed Singulair. Started on Allegra 60 mg by mouth twice a day at last appointment. She reports she is still having sinus congestion with itchy, watery eyes and post-nasal drainage. She does feel it helps some.   GERD:   Follows with GI. Currently on Pepcid bid & Protonix qAM. Reports no morning brash water taste and improved reflux. Previously significant eructation has resolved.   Mild-Moderate OSA:  Being followed by Dr. Rexene Alberts with Neurology.   Review of Systems No chest pain or pressure. She has noticed some mild chest tightness over the last couple of weeks. She has used her sublingual nitroglycerin with some relief. No fever or chills.   Allergies  Allergen Reactions  . Amlodipine   . Banana Nausea And Vomiting    Stomach pumped  . Co Q10 [Coenzyme Q10]     Body cramps  . Codeine     Hallucinate, loose identity and don't know who I am  . Morphine Other (See Comments)    Can not function, it immobilizes me   . Sulfonamide Derivatives Swelling   Current Outpatient Prescriptions on File Prior to Visit  Medication Sig Dispense Refill  . albuterol (PROVENTIL) (2.5 MG/3ML) 0.083% nebulizer solution Take 3 mLs (2.5 mg total) by nebulization every 6 (six) hours as  needed. For shortness of breath. 360 mL 11  . ALPRAZolam (XANAX) 0.25 MG tablet Take 0.25 mg by mouth 2 (two) times daily as needed.     . baclofen (LIORESAL) 20 MG tablet Take 1 tablet by mouth 2 (two) times daily.    . Blood Glucose Monitoring Suppl (ONE TOUCH ULTRA MINI) w/Device KIT 3 (three) times daily. for testing  0  . budesonide (PULMICORT) 0.5 MG/2ML nebulizer solution Take 2 mLs (0.5 mg total) by nebulization 2 (two) times daily. 120 mL 11  . Calcium & Magnesium Carbonates (MYLANTA PO) Take 1 tablet by mouth daily as needed. For indigestion.    . Cholecalciferol (VITAMIN D) 2000 UNITS tablet Take 2,000 Units by mouth daily.     . clopidogrel (PLAVIX) 75 MG tablet Take 1 tablet (75 mg total) by mouth every morning. 90 tablet 3  . Cyanocobalamin (VITAMIN B-12 IJ) Inject as directed every 30 (thirty) days.    . cyclobenzaprine (FLEXERIL) 5 MG tablet Take 1 tablet (5 mg total) by mouth at bedtime. 30 tablet 3  . diltiazem (CARDIZEM CD) 240 MG 24 hr capsule Take 1 capsule (240 mg total) by mouth daily. 30 capsule 10  . doxazosin (CARDURA) 8 MG tablet Take 1 tablet (8 mg total) by mouth daily. 30 tablet 6  . famotidine (PEPCID) 20 MG tablet Take 20 mg by mouth 2 (two) times daily.    . formoterol (PERFOROMIST) 20 MCG/2ML nebulizer solution Take  2 mLs (20 mcg total) by nebulization 2 (two) times daily. 120 mL 11  . furosemide (LASIX) 40 MG tablet Take 40 mg by mouth every other day.     . insulin lispro protamine-insulin lispro (HUMALOG 75/25) (75-25) 100 UNIT/ML SUSP 18 in am, 27 in evening    . isosorbide mononitrate (IMDUR) 30 MG 24 hr tablet Take 1 tablet (30 mg total) by mouth daily. 31 tablet 11  . loperamide (IMODIUM) 2 MG capsule Take 2 mg by mouth 2 (two) times daily.    . meclizine (ANTIVERT) 25 MG tablet Take 25 mg by mouth as needed for dizziness (dizziness).     . metoprolol succinate (TOPROL-XL) 50 MG 24 hr tablet Take 1 tablet by mouth daily.    . montelukast (SINGULAIR) 5 MG  chewable tablet Chew 1 tablet (5 mg total) by mouth at bedtime. 30 tablet 3  . Multiple Vitamin (MULTIVITAMIN WITH MINERALS) TABS Take 1 tablet by mouth daily.    . Na Sulfate-K Sulfate-Mg Sulf 17.5-3.13-1.6 GM/180ML SOLN Take 1 kit by mouth once. 354 mL 0  . Nebulizers (COMPRESSOR/NEBULIZER) MISC Use with albuterol 1 each 0  . neomycin-polymyxin b-dexamethasone (MAXITROL) 3.5-10000-0.1 OINT Apply topically as needed    . nitroGLYCERIN (NITROSTAT) 0.4 MG SL tablet Place 0.4 mg under the tongue every 5 (five) minutes x 3 doses as needed. For chest pain.    . ONE TOUCH ULTRA TEST test strip CHECK BLOOD SUGAR TWICE A DAY DX: E11.9  3  . ONETOUCH DELICA LANCETS 26O MISC 3 (three) times daily. for testing  0  . Pancrelipase, Lip-Prot-Amyl, 24000 units CPEP Take 2 capsules (48,000 Units total) by mouth 3 (three) times daily with meals. Take 1 capsule with a snack 210 capsule 6  . potassium chloride SA (K-DUR,KLOR-CON) 20 MEQ tablet Take 20 mEq by mouth 2 (two) times daily.    . prednisoLONE acetate (PRED FORTE) 1 % ophthalmic suspension Place 1 drop into the left eye 4 (four) times daily.  0  . albuterol (PROVENTIL HFA;VENTOLIN HFA) 108 (90 BASE) MCG/ACT inhaler Inhale 2 puffs into the lungs every 6 (six) hours as needed. For shortness of breath. (Patient not taking: Reported on 09/03/2015) 1 Inhaler 0  . pantoprazole (PROTONIX) 40 MG tablet Take 1 tablet (40 mg total) by mouth daily. 30 tablet 11   Current Facility-Administered Medications on File Prior to Visit  Medication Dose Route Frequency Provider Last Rate Last Dose  . cyanocobalamin ((VITAMIN B-12)) injection 1,000 mcg  1,000 mcg Intramuscular Q30 days Binnie Rail, MD   1,000 mcg at 07/21/15 1530   Past Medical History:  Diagnosis Date  . Acute myocardial infarction, unspecified site, episode of care unspecified   . Adenomatous colon polyp   . Anxiety   . Chest pain   . Coronary artery disease   . Diabetes mellitus   . GERD  (gastroesophageal reflux disease)   . Hiatal hernia   . Hyperlipidemia   . Hypertension   . PONV (postoperative nausea and vomiting)   . Schatzki's ring   . Shoulder injury    resolved after shoulder surgery  . Unspecified asthma(493.90)   . Upper respiratory symptom    02-13-14 "runny nose, mild cough,sniffles" -instructed to inform MD if symptoms worsens   Past Surgical History:  Procedure Laterality Date  . ABDOMINAL HYSTERECTOMY    . APPENDECTOMY     came out with Hysterectomy  . CARDIAC CATHETERIZATION  07/23/2009   EF 60%  . CARDIAC CATHETERIZATION  10/11/2008  . CARDIAC CATHETERIZATION  03/01/2007   EF 75-80%  . CARDIOVASCULAR STRESS TEST  11/15/2007   EF 60%  . COLONOSCOPY    . CORONARY ARTERY BYPASS GRAFT     SEVERELY DISEASED SAPHENOUS VEIN GRAFT TO THE RIGHT CORONARY ARTERY BUT WITH FAIRLY WELL PRESERVED FLOW TO THE DISTAL RIGHT CORONARY ARTERY FROM THE NATIVE CIRCULATION-RESTART  CATH IN JUNE 2000, REVEALS MILD/MODERATE  CAD WITH GOOD FLOW DOWN HER LAD  . ESOPHAGEAL DILATION    . ESOPHAGOGASTRODUODENOSCOPY    . EYE SURGERY     bilateral cataract surgery with lens implant  . lense removal Left   . ROTATOR CUFF REPAIR     right and left  . TONSILLECTOMY     age 41  . 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 tumor removed kidney    . US ECHOCARDIOGRAPHY  03/08/2008   EF 55-60%   Family History  Problem Relation Age of Onset  . Emphysema Paternal Uncle   . Heart disease Maternal Grandfather   . Heart failure Maternal Grandfather   . Esophageal cancer Brother 5    she said he was born with it  . Heart attack Father   . Stomach cancer Father   . Neuropathy Neg Hx   . Multiple sclerosis Neg Hx   . Rheum arthritis Sister   . Emphysema Paternal Aunt   . Colon cancer Neg Hx   . Diabetes Mother   . Diabetes Maternal Grandfather     Social History   Social History  . Marital status: Widowed    Spouse name: N/A  . Number of children: 4  . Years of education: Doctorate   Occupational History  . Retired    Social History Main Topics  . Smoking status: Former Smoker    Packs/day: 0.50    Years: 21.00    Types: Cigarettes    Start date: 02/09/1955    Quit date: 02/09/1976  . Smokeless tobacco: Never Used  . Alcohol use No  . Drug use: No  . Sexual activity: Not Asked   Other Topics Concern  . None   Social History Narrative   Lives alone.   Caffeine use: Drinks 1 cup coffee/day      Originally from [place]. Previously has lived in [place]. Prior travel to [place], [place], [place], [place], [place], [place], [place], & [place]. Previously worked in [employer] She has a dog currently. No bird, mold, or hot tub exposure. She also pastors a church.       Objective:   Physical Exam BP 132/82 (BP Location: Left Arm, Cuff Size: Normal)   Pulse 87   Ht '5\' 3"'  (1.6 m)   Wt 198 lb 3.2 oz (89.9 kg)   SpO2 96%   BMI 35.11 kg/m  General:  Obese female. Awake. No distress. Integument:  Warm & dry. No bruising exposed skin. HEENT:  Moist mucus membranes. Persistent nasal turbinate swelling. No oral ulcers.  Cardiovascular:  Regular& rhythm. Normal S1 & S2.  Pulmonary: Good aeration & clear on auscultation bilaterally. Normal work of breathing on room air.  Abdomen: Soft. Normal bowel sounds. Protuberant. Musculoskeletal:  Normal bulk and tone. No joint deformity or effusion appreciated.  PFT 06/04/15: FVC 1.57 L (77%) FEV1 1.01 L (64%) FEV1/FVC 0.65 FEF 25-75 0.54 L (39%) negative bronchodilator response 02/21/15: FVC 1.60 L (  78%) FEV1 1.03 L (65%) FEV1/FVC 0.64 FEF 25-75 0.51 L (37%) positive bronchodilator response 11/20/14: FVC 1.44 L (70%) FEV1 0.91 L (57%) FEV1/FVC 0.63 FEF 25-75 0.47 L (33%) positive bronchodilator response TLC 3.92 L (79%) RV 103% DLCO uncorrected 64% 11/12/13: FVC 1.57 L (74%) FEV1 1.09 L (67%)  FEV1/FVC 0.70 FEF 25-75 0.72 L (50%) negative bronchodilator response 08/04/11: FVC 1.61 L (60%) FEV1 0.89 L (48%) FEV1/FVC 0.56 FEF 25-75 0.29 L (14%) positive bronchodilator response TLC 3.67 L (80%) RV 103% ERV 46% DLCO corrected 38%  6MWT 11/20/14:  Walked 336 meters / Baseline Sat 100% on RA / Nadir Sat 99% on RA (pt c/o pain in her right neck w/ dyspnea)  IMAGING CXR PA/LAT 03/13/15 (previously reviewed by me): No new nodule or opacity appreciated. No pleural effusion. Mild hyperinflation with flattening of the diaphragms. Heart normal in size. Mediastinum normal in contour.  BARIUM SWALLOW (10/15/14): Schatzki ring distal esophagus maximum diameter 1.2 cm with barium tablet impacting at this ring.  CXR PA/LAT 02/13/14 (previously reviewed by me):  Sternotomy wires from prior thoracic surgery noted. Heart normal in size. No pleural effusion appreciated. No parenchymal nodule or opacification appreciated. Mild flattening of the diaphragms bilaterally.  CARDIAC TTE (10/06/11): LV normal in size. Normal regional wall motion. EF 60-65%. Grade 1 diastolic dysfunction. LA & RA normal in size. 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 RV normal in size and function. RVSP 19 mmHg. No aortic stenosis or regurgitation. No mitral stenosis or regurgitation. No pulmonic stenosis. Trivial tricuspid regurgitation. No pericardial effusion.  LABS 03/13/15 Procalcitonin:  <0.1 CBC: 10.9/11.8/37.3/245  10/08/14 IgG: 1060 IgM: 112 IgA: 272 IgE: 5 CBC: 6.1/11.3/36.0/293 Differential: Eos 0.3 (4.5%)  RAST panel: Negative Aspergillus antigen: <0.1  1/26/9 ABG:  7.39/44/113    Assessment & Plan:  75 year old female with severe, persistent asthma with airways obstruction. Patient continuing to have trouble affording her rescue inhaler. She is continuing to use her albuterol nebulizer solution on an as-needed basis. With her continued symptoms from her allergic rhinitis I am increasing the dose of her  Allegra to provide her some relief. Her reflux seems to be well-controlled at this time and I feel is noncontributory. I instructed the patient to contact my office if she had any new breathing problems before next appointment as I would be happy to see her sooner.  1. Severe, Persistent Asthma w/ Fixed Airway Obstruction:  Consider switching to generic Advair at next appointment if it would be cheaper. Continuing to perform most and Pulmicort for now. 2. GERD: Well-controlled. Continuing Protonix & Pepcid. 3. Allergic Rhinitis: Continuing Singulair. Increasing fexofenadine to 90 mg twice daily. 4. OSA: Management per Neurology. 5. Health maintenance: Status post Pneumovax January 2006. Prevnar vaccine today. Commended influenza vaccine in the Fall. 6. Follow-up: Return to clinic in 6 months or sooner if needed.   Sonia Baller Ashok Cordia, M.D. [facility] Pager:  [phone] After 3pm or if no response, call [phone] 12:12 PM 09/03/15

## 2015-09-03 NOTE — Patient Instructions (Addendum)
   Continue using your nebulizer medications as prescribed.  Continue your Singulair.  I'm increasing your Allegra (Fexophenadine) to 90mg  twice daily (that will be 3 of the 30mg  tablets twice daily).  Call me or e-mail me if you have any new breathing problems before your next appointment.

## 2015-09-03 NOTE — Telephone Encounter (Signed)
Pharmacy calling stating that the allegra comes in either 60 mg or 180 mg.  This does not come in 30 mg tablets.  Which dose would you like to have the pt on.  Please advise. Thanks  Allergies  Allergen Reactions  . Amlodipine   . Banana Nausea And Vomiting    Stomach pumped  . Co Q10 [Coenzyme Q10]     Body cramps  . Codeine     Hallucinate, loose identity and don't know who I am  . Morphine Other (See Comments)    Can not function, it immobilizes me   . Sulfonamide Derivatives Swelling

## 2015-09-03 NOTE — Addendum Note (Signed)
Addended by: Inge Rise on: 09/03/2015 12:16 PM   Modules accepted: Orders

## 2015-09-04 MED ORDER — FEXOFENADINE HCL 180 MG PO TABS: 180.0000 mg | ORAL_TABLET | Freq: Every day | ORAL | 6 refills | Status: DC

## 2015-09-04 NOTE — Telephone Encounter (Signed)
Rx changed and sent to pharmacy. Nothing further needed.

## 2015-09-05 ENCOUNTER — Other Ambulatory Visit: Payer: Self-pay | Admitting: Ophthalmology

## 2015-09-05 DIAGNOSIS — H34219 Partial retinal artery occlusion, unspecified eye: Secondary | ICD-10-CM

## 2015-09-08 ENCOUNTER — Other Ambulatory Visit (HOSPITAL_COMMUNITY): Payer: Self-pay | Admitting: Ophthalmology

## 2015-09-08 ENCOUNTER — Ambulatory Visit (HOSPITAL_COMMUNITY)
Admission: RE | Admit: 2015-09-08 | Discharge: 2015-09-08 | Disposition: A | Discharge status: Home / Self Care | Payer: Medicare Other | Source: Ambulatory Visit | Attending: Internal Medicine | Admitting: Internal Medicine

## 2015-09-08 ENCOUNTER — Other Ambulatory Visit: Payer: Self-pay | Admitting: Internal Medicine

## 2015-09-08 DIAGNOSIS — I6523 Occlusion and stenosis of bilateral carotid arteries: Secondary | ICD-10-CM

## 2015-09-08 DIAGNOSIS — I2510 Atherosclerotic heart disease of native coronary artery without angina pectoris: Secondary | ICD-10-CM | POA: Insufficient documentation

## 2015-09-08 DIAGNOSIS — E785 Hyperlipidemia, unspecified: Secondary | ICD-10-CM | POA: Insufficient documentation

## 2015-09-08 DIAGNOSIS — I10 Essential (primary) hypertension: Secondary | ICD-10-CM | POA: Insufficient documentation

## 2015-09-08 DIAGNOSIS — F419 Anxiety disorder, unspecified: Secondary | ICD-10-CM | POA: Diagnosis not present

## 2015-09-08 DIAGNOSIS — E119 Type 2 diabetes mellitus without complications: Secondary | ICD-10-CM | POA: Insufficient documentation

## 2015-09-08 DIAGNOSIS — K219 Gastro-esophageal reflux disease without esophagitis: Secondary | ICD-10-CM | POA: Diagnosis not present

## 2015-09-10 ENCOUNTER — Ambulatory Visit (AMBULATORY_SURGERY_CENTER): Payer: Medicare Other | Admitting: Gastroenterology

## 2015-09-10 ENCOUNTER — Encounter: Payer: Self-pay | Admitting: Gastroenterology

## 2015-09-10 VITALS — BP 128/62 | HR 84 | Temp 97.8°F | Resp 25 | Ht 63.0 in | Wt 198.0 lb

## 2015-09-10 DIAGNOSIS — K635 Polyp of colon: Secondary | ICD-10-CM | POA: Diagnosis not present

## 2015-09-10 DIAGNOSIS — R197 Diarrhea, unspecified: Secondary | ICD-10-CM | POA: Diagnosis present

## 2015-09-10 DIAGNOSIS — D123 Benign neoplasm of transverse colon: Secondary | ICD-10-CM | POA: Diagnosis not present

## 2015-09-10 LAB — GLUCOSE, CAPILLARY
Glucose-Capillary: 162 mg/dL — ABNORMAL HIGH (ref 65–99)
Glucose-Capillary: 139 mg/dL — ABNORMAL HIGH (ref 65–99)

## 2015-09-10 MED ORDER — SODIUM CHLORIDE 0.9 % IV SOLN: 500.0000 mL | INTRAVENOUS | Status: DC

## 2015-09-10 NOTE — Patient Instructions (Addendum)
YOU HAD AN ENDOSCOPIC PROCEDURE TODAY AT [facility]:   Refer to the procedure report that was given to you for any specific questions about what was found during the examination.  If the procedure report does not answer your questions, please call your gastroenterologist to clarify.  If you requested that your care partner not be given the details of your procedure findings, then the procedure report has been included in a sealed envelope for you to review at your convenience later.  YOU SHOULD EXPECT: Some feelings of bloating in the abdomen. Passage of more gas than usual.  Walking can help get rid of the air that was put into your GI tract during the procedure and reduce the bloating. If you had a lower endoscopy (such as a colonoscopy or flexible sigmoidoscopy) you may notice spotting of blood in your stool or on the toilet paper. If you underwent a bowel prep for your procedure, you may not have a normal bowel movement for a few days.  Please Note:  You might notice some irritation and congestion in your nose or some drainage.  This is from the oxygen used during your procedure.  There is no need for concern and it should clear up in a day or so.  SYMPTOMS TO REPORT IMMEDIATELY:   Following lower endoscopy (colonoscopy or flexible sigmoidoscopy):  Excessive amounts of blood in the stool  Significant tenderness or worsening of abdominal pains  Swelling of the abdomen that is new, acute  Fever of 100F or higher    For urgent or emergent issues, a gastroenterologist can be reached at any hour by calling [phone].   DIET: Your first meal following the procedure should be a small meal and then it is ok to progress to your normal diet. Heavy or fried foods are harder to digest and may make you feel nauseous or bloated.  Likewise, meals heavy in dairy and vegetables can increase bloating.  Drink plenty of fluids but you should avoid alcoholic beverages for 24  hours.  ACTIVITY:  You should plan to take it easy for the rest of today and you should NOT DRIVE or use heavy machinery until tomorrow (because of the sedation medicines used during the test).    FOLLOW UP: Our staff will call the number listed on your records the next business day following your procedure to check on you and address any questions or concerns that you may have regarding the information given to you following your procedure. If we do not reach you, we will leave a message.  However, if you are feeling well and you are not experiencing any problems, there is no need to return our call.  We will assume that you have returned to your regular daily activities without incident.  If any biopsies were taken you will be contacted by phone or by letter within the next 1-3 weeks.  Please call us at [phone] if you have not heard about the biopsies in 3 weeks.    SIGNATURES/CONFIDENTIALITY: You and/or your care partner have signed paperwork which will be entered into your electronic medical record.  These signatures attest to the fact that that the information above on your After Visit Summary has been reviewed and is understood.  Full responsibility of the confidentiality of this discharge information lies with you and/or your care-partner.   Resume Plavix in 2 days at prior dose,resume remainder of medications. Follow up with doctor in 6 weeks. Information given on polyps,hemorrhoids  and high fiber diet.

## 2015-09-10 NOTE — Progress Notes (Signed)
Called to room to assist during endoscopic procedure.  Patient ID and intended procedure confirmed with present staff. Received instructions for my participation in the procedure from the performing physician.  

## 2015-09-10 NOTE — Progress Notes (Signed)
To pacu vss patent aw reprot to rn 

## 2015-09-10 NOTE — Op Note (Addendum)
[place] Patient Name: [name] Procedure Date: 09/10/2015 2:54 PM MRN: [id number] Endoscopist: Ladene Artist , MD Age: 74 Referring MD:  Date of Birth: 24-Apr-1940 Gender: Female Account #: [id number] Procedure:                Colonoscopy Indications:              Clinically significant diarrhea of unexplained                            origin Medicines:                Monitored Anesthesia Care Procedure:                Pre-Anesthesia Assessment:                           - Prior to the procedure, a History and Physical                            was performed, and patient medications and                            allergies were reviewed. The patient's tolerance of                            previous anesthesia was also reviewed. The risks                            and benefits of the procedure and the sedation                            options and risks were discussed with the patient.                            All questions were answered, and informed consent                            was obtained. Prior Anticoagulants: The patient has                            taken Coumadin (warfarin), last dose was 5 days                            prior to procedure. ASA Grade Assessment: III - A                            patient with severe systemic disease. After                            reviewing the risks and benefits, the patient was                            deemed in satisfactory condition to undergo the  procedure.                           After obtaining informed consent, the colonoscope                            was passed under direct vision. Throughout the                            procedure, the patient's blood pressure, pulse, and                            oxygen saturations were monitored continuously. The                            Model [id number]-[id number] scope was introduced                            through the  anus and advanced to the the cecum,                            identified by appendiceal orifice and ileocecal                            valve. The terminal ileum, ileocecal valve,                            appendiceal orifice, and rectum were photographed.                            The quality of the bowel preparation was excellent.                            The colonoscopy was performed without difficulty.                            The patient tolerated the procedure well. Scope In: 3:02:56 PM Scope Out: 3:16:24 PM Scope Withdrawal Time: 0 hours 10 minutes 35 seconds  Total Procedure Duration: 0 hours 13 minutes 28 seconds  Findings:                 A 4 mm polyp was found in the transverse colon. The                            polyp was sessile. The polyp was removed with a                            cold biopsy forceps. Resection and retrieval were                            complete.                           Internal hemorrhoids were found during  retroflexion. The hemorrhoids were small and Grade                            I (internal hemorrhoids that do not prolapse).                           The exam was otherwise without abnormality on                            direct and retroflexion views. Multiple random                            biopsies were obtained throughout the colon.                            Several attempts to enter the TI were unsuccessful                           Many medium-mouthed diverticula were found in the                            sigmoid colon. There was no evidence of                            diverticular bleeding. Complications:            No immediate complications. Estimated blood loss:                            None. Estimated Blood Loss:     Estimated blood loss: none. Impression:               - One 4 mm polyp in the transverse colon, removed                            with a cold biopsy forceps. Resected and  retrieved.                           - Internal hemorrhoids.                           - Moderate diverticulosis in the sigmoid colon. Recommendation:           - Patient has a contact number available for                            emergencies. The signs and symptoms of potential                            delayed complications were discussed with the                            patient. Return to normal activities tomorrow.                            Written discharge instructions  were provided to the                            patient.                           - Resume previous diet.                           - Continue present medications.                           - Await pathology results.                           - No repeat colonoscopy due to age.                           - Return to GI clinic in 6 weeks.                           - Resume Plavix (clopidogrel) at prior dose in 2                            days. Refer to managing physician for further                            adjustment of therapy. Ladene Artist, MD 09/10/2015 3:26:02 PM This report has been signed electronically.

## 2015-09-11 ENCOUNTER — Telehealth: Payer: Self-pay

## 2015-09-11 NOTE — Telephone Encounter (Signed)
Left message on machine.

## 2015-09-15 ENCOUNTER — Encounter: Payer: Self-pay | Admitting: Neurology

## 2015-09-15 ENCOUNTER — Telehealth: Payer: Self-pay

## 2015-09-15 ENCOUNTER — Ambulatory Visit (INDEPENDENT_AMBULATORY_CARE_PROVIDER_SITE_OTHER): Payer: Medicare Other | Admitting: Neurology

## 2015-09-15 VITALS — BP 162/90 | HR 78 | Resp 16 | Ht 63.0 in | Wt 200.0 lb

## 2015-09-15 DIAGNOSIS — G4733 Obstructive sleep apnea (adult) (pediatric): Secondary | ICD-10-CM | POA: Diagnosis not present

## 2015-09-15 DIAGNOSIS — Z9989 Dependence on other enabling machines and devices: Principal | ICD-10-CM

## 2015-09-15 NOTE — Telephone Encounter (Signed)
At today's visit, patient voices trouble with CPAP mask and CPAP not recording her usage. I have emailed Heather with [employer] to have someone contact patient.

## 2015-09-15 NOTE — Patient Instructions (Addendum)
Please continue using your CPAP regularly. While your insurance requires that you use CPAP at least 4 hours each night on 70% of the nights, I recommend, that you not skip any nights and use it throughout the night if you can. Getting used to CPAP and staying with the treatment long term does take time and patience and discipline. Untreated obstructive sleep apnea when it is moderate to severe can have an adverse impact on cardiovascular health and raise her risk for heart disease, arrhythmias, hypertension, congestive heart failure, stroke and diabetes. Untreated obstructive sleep apnea causes sleep disruption, nonrestorative sleep, and sleep deprivation. This can have an impact on your day to day functioning and cause daytime sleepiness and impairment of cognitive function, memory loss, mood disturbance, and problems focussing. Using CPAP regularly can improve these symptoms.  Please make an appointment with your DME provider, [employer] for a mask refit and to troubleshoot your machine, as it does not seem you register your usage.   We will do a 2 month follow up with one of our nurse practitioner. I will see you back after that.

## 2015-09-15 NOTE — Progress Notes (Signed)
Subjective:    Patient ID: [name] is a 75 y.o. female.  HPI     Interim history:   [name] is a 75 year old right-handed woman with an underlying medical history of hyperlipidemia, CAD, s/p 3 vessel CABG, B12 deficiency, hypertension, type 2 diabetes, neck pain, NASH and obesity, who presents for follow-up consultation of her obstructive sleep apnea, after her sleep studies. The patient is unaccompanied today. I first met her on 10/17/2014 at the request of Dr. Jaynee Eagles, at which time she reported snoring, nonrestorative sleep, morning headaches, and nocturia. I invited her back for sleep study. She had a baseline sleep study, followed by a CPAP titration study. I went over her test results with her in detail today. Her baseline sleep study from 10/31/2014 showed a sleep efficiency of 83.2%, latency to sleep 39.5 minutes, wake after sleep onset of 30 minutes with mild to moderate sleep fragmentation noted. She had an increased percentage of light stage sleep, absence of slow-wave sleep and REM sleep at 19.1% with a borderline prolonged REM latency. Moderate PLMS were noted at 36.7 per hour, resulting in only 1.2 arousals per hour. She had no significant EKG or EEG changes. She had mild to moderate snoring. Overall AHI was 6.8 per hour. Average oxygen saturations 91%, nadir was 78%, time below 90% saturation was 1 hour and 17 minutes for the night. Based on her medical history and her sleep-related complaints as well as test results I invited her for a full night CPAP titration study. She had this on 04/14/2015. Sleep efficiency was 88.2%, sleep latency 13.5 minutes, wake after sleep onset was 33 minutes with mild to moderate sleep fragmentation noted. She had an increased percentage of stage II sleep, absence of slow-wave sleep and a mildly decreased percentage of REM sleep at 17.8% with a REM latency of 106 minutes. She had mild PLMS with an index of 16.7 per hour, resulting in only 1.6 arousals  per hour. Average oxygen saturation was 91%, nadir was 84%. Time below 90% saturation was 2 hours and 27 minutes for the night, time below 88% saturation was 9 minutes for the night. CPAP was titrated from 5 cm to 9 cm. AHI was 0 per hour on the final pressure with supine REM sleep achieved.   Today, 09/15/2015: I reviewed her CPAP compliance data from 08/12/2015 through 09/10/2015 which is a total of 30 days during which time she used her machine only 18 days with percent used days greater than 4 hours at only 30%, indicating poor compliance with an average usage of 2 hours and 16 minutes, residual AHI 4.5 per hour, leak at times high, 95th percentile at 22.1 L/m on a pressure of 9 cm with EPR of 3. 90 day compliance from 07/14/2015 through 09/10/2015 showed a compliance percentage of 43%, again suboptimal, residual AHI suboptimal at 7.3 per hour.  Today, 09/15/2015: She reports problems with the machine, it does not seem to register her usage. She has seen Dr. Jaynee Eagles last month, who made a referral to Dr. Nelva Bush for neck pain. She has an appointment this week with him. Patient reports ongoing issues with dizziness and gait problems. She follows closely with her pulmonologist, Dr. Ashok Cordia for her severe asthma and recurrent respiratory infections. She had a colonoscopy last week. She has had issues with diarrhea for about 6 months.   Previously:   10/17/2014: She reports snoring and non-restorative sleep, nocturia of 3-4/night, as well as morning headaches for the past 1+ year.  I reviewed your office note from 08/28/2014. She presented with a history of gait disorder, balance problems and fecal incontinence. You suggested a brain MRI without contrast, cervical spine MRI without contrast as well as lumbar spine MRI without contrast. She had these on 09/16/2014:  This is a normal age-appropriate MRI of the brain without contrast. There are no acute findings. Incidental note is made of a "partially empty"  sella turcica, unchanged when compared to 2013.   This is an abnormal MRI of the cervical spine showing multilevel degenerative changes as detailed above. The most significant findings are: 1.  Stable left greater than right uncovertebral spurring at C3-C4 causing moderate left foraminal narrowing is no nerve root compression but there is some encroachment upon the exiting left C4 nerve root.   2.  Mild anterolisthesis at C4-C5 associated with mild facet hypertrophy. There is severe right uncovertebral spurring causing moderate foraminal narrowing. There is no definite nerve root compression though there is some encroachment upon the right C5 nerve root. The anterolisthesis has progressed slightly when compared to 2006. 3.  Mild anterolisthesis of C5-C6 associated with mild facet hypertrophy. There does not appear to be nerve root impingement at this level. The anterolisthesis has progressed slightly when compared to 2006. 4.  Disc bulging and uncovertebral spurring associated with moderately severe loss of disc height at C6-C7. There is moderate bilateral foraminal narrowing. Although there is no definite nerve root compression, there is some encroachment upon the exiting C7 nerve roots.   This is an abnormal MRI of the lumbar spine showed the following: 1.   Normal conus medullaris and cauda equina. 2.   Moderate facet hypertrophy at L3-L4 with joint effusions, severe facet hypertrophy at L4-L5 with large joint effusions and moderate facet hypertrophy at L5-S1 with a small right joint effusion.  At L4-L5, there is right greater than left lateral recess stenosis. There does not appear to be any definite nerve root compression though there is some encroachment upon both of the traversing L5 nerve roots. You called the patient with the test results and also suggested further workup through gastroenterology. Of note, she has difficulty falling asleep and staying asleep. This has been going on for years.  Also, noteworthy, over 10 years ago she was diagnosed with obstructive sleep apnea and had a sleep study. Prior sleep test results are not available for my review. She was given a CPAP machine and states that she tried it for perhaps a year but it was a struggle. She had problems with mask fitting and tolerance and finally gave up on it. She would be willing to get tested again and try CPAP again. She lives alone. She is widowed. She has 4 grown children. She has a variable sleep schedule. She has a bedtime of around 11:30 PM and has some difficulty falling asleep. She may take a sleeping pill. She is up after 3 or 4 hours. She also reports significant nocturia of 3-4 times each night. 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 She reads in bed. She really watches TV in bed. She drinks one cup of coffee in the morning and typically no sodas and occasional tea. She is trying to lose weight. After her knee replacement surgery in January of this year she has lost a lot of weight, which was unintended and secondary to severe loss of appetite. She quit smoking 30 years ago. She does not drink alcohol. She denies restless leg symptoms or  leg twitching at night. She had a right total shoulder replacement, left rotator cuff surgery, right knee replacement surgery and triple bypass in 2011.    Her Past Medical History Is Significant For: Past Medical History:  Diagnosis Date  . Acute myocardial infarction, unspecified site, episode of care unspecified   . Adenomatous colon polyp   . Anxiety   . Chest pain   . Coronary artery disease   . Diabetes mellitus   . GERD (gastroesophageal reflux disease)   . Hiatal hernia   . Hyperlipidemia   . Hypertension   . PONV (postoperative nausea and vomiting)   . Schatzki's ring   . Shoulder injury    resolved after shoulder surgery  . Unspecified asthma(493.90)   . Upper respiratory symptom    02-13-14 "runny nose, mild cough,sniffles" -instructed to  inform MD if symptoms worsens    Her Past Surgical History Is Significant For: Past Surgical History:  Procedure Laterality Date  . ABDOMINAL HYSTERECTOMY    . APPENDECTOMY     came out with Hysterectomy  . CARDIAC CATHETERIZATION  07/23/2009   EF 60%  . CARDIAC CATHETERIZATION  10/11/2008  . CARDIAC CATHETERIZATION  03/01/2007   EF 75-80%  . CARDIOVASCULAR STRESS TEST  11/15/2007   EF 60%  . COLONOSCOPY    . CORONARY ARTERY BYPASS GRAFT     SEVERELY DISEASED SAPHENOUS VEIN GRAFT TO THE RIGHT CORONARY ARTERY BUT WITH FAIRLY WELL PRESERVED FLOW TO THE DISTAL RIGHT CORONARY ARTERY FROM THE NATIVE CIRCULATION-RESTART  CATH IN JUNE 2000, REVEALS MILD/MODERATE  CAD WITH GOOD FLOW DOWN HER LAD  . ESOPHAGEAL DILATION    . ESOPHAGOGASTRODUODENOSCOPY    . EYE SURGERY     bilateral cataract surgery with lens implant  . lense removal Left   . ROTATOR CUFF REPAIR     right and left  . TONSILLECTOMY     age 60  . 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 tumor removed kidney    . US ECHOCARDIOGRAPHY  03/08/2008   EF 55-60%    Her Family History Is Significant For: Family History  Problem Relation Age of Onset  . Heart disease Maternal Grandfather   . Heart failure Maternal Grandfather   . Diabetes Maternal Grandfather   . Heart attack Father   . Stomach cancer Father   . Diabetes Mother   . Rheum arthritis Sister   . Emphysema Paternal Uncle   . Esophageal cancer Brother 21    she said he was born with it  . Emphysema Paternal Aunt   . Neuropathy Neg Hx   . Multiple sclerosis Neg Hx   . Colon cancer Neg Hx     Her Social History Is Significant For: Social History   Social History  . Marital status: Widowed    Spouse name: N/A  . Number of children: 4  . Years of education: Doctorate   Occupational History  . Retired    Social History  Main Topics  . Smoking status: Former Smoker    Packs/day: 0.50    Years: 21.00    Types: Cigarettes    Start date: 02/09/1955    Quit date: 02/09/1976  . Smokeless tobacco: Never Used  . Alcohol use No  . Drug use: No  . Sexual activity: Not Asked   Other Topics Concern  . None  Social History Narrative   Lives alone.   Caffeine use: Drinks 1 cup coffee/day      Originally from [place]. Previously has lived in [place]. Prior travel to [place], [place], [place], [place], [place], [place], [place], & [place]. Previously worked in [employer] She has a dog currently. No bird, mold, or hot tub exposure. She also pastors a church.     Her Allergies Are:  Allergies  Allergen Reactions  . Amlodipine   . Banana Nausea And Vomiting    Stomach pumped  . Co Q10 [Coenzyme Q10]     Body cramps  . Codeine     Hallucinate, loose identity and don't know who I am  . Morphine Other (See Comments)    Can not function, it immobilizes me   . Sulfonamide Derivatives Swelling  :   Her Current Medications Are:  Outpatient Encounter Prescriptions as of 09/15/2015  Medication Sig  . albuterol (PROVENTIL HFA;VENTOLIN HFA) 108 (90 BASE) MCG/ACT inhaler Inhale 2 puffs into the lungs every 6 (six) hours as needed. For shortness of breath.  Marland Kitchen albuterol (PROVENTIL) (2.5 MG/3ML) 0.083% nebulizer solution Take 3 mLs (2.5 mg total) by nebulization every 6 (six) hours as needed. For shortness of breath.  . ALPRAZolam (XANAX) 0.25 MG tablet Take 0.25 mg by mouth 2 (two) times daily as needed.   . baclofen (LIORESAL) 20 MG tablet Take 1 tablet by mouth 2 (two) times daily.  . Blood Glucose Monitoring Suppl (ONE TOUCH ULTRA MINI) w/Device KIT 3 (three) times daily. for testing  . budesonide (PULMICORT) 0.5 MG/2ML nebulizer solution Take 2 mLs (0.5 mg total) by nebulization 2 (two) times daily.  . Calcium & Magnesium Carbonates (MYLANTA PO) Take 1 tablet by mouth daily as needed. For indigestion.  . Cholecalciferol (VITAMIN  D) 2000 UNITS tablet Take 2,000 Units by mouth daily.   . clopidogrel (PLAVIX) 75 MG tablet Take 1 tablet (75 mg total) by mouth every morning.  . Cyanocobalamin (VITAMIN B-12 IJ) Inject as directed every 30 (thirty) days.  . cyclobenzaprine (FLEXERIL) 5 MG tablet Take 1 tablet (5 mg total) by mouth at bedtime.  Marland Kitchen diltiazem (CARDIZEM CD) 240 MG 24 hr capsule Take 1 capsule (240 mg total) by mouth daily.  Marland Kitchen doxazosin (CARDURA) 8 MG tablet Take 1 tablet (8 mg total) by mouth daily.  . famotidine (PEPCID) 20 MG tablet Take 20 mg by mouth 2 (two) times daily.  . fexofenadine (ALLEGRA) 180 MG tablet Take 1 tablet (180 mg total) by mouth daily.  . formoterol (PERFOROMIST) 20 MCG/2ML nebulizer solution Take 2 mLs (20 mcg total) by nebulization 2 (two) times daily.  . furosemide (LASIX) 40 MG tablet Take 40 mg by mouth every other day.   . insulin lispro protamine-insulin lispro (HUMALOG 75/25) (75-25) 100 UNIT/ML SUSP 18 in am, 27 in evening  . isosorbide mononitrate (IMDUR) 30 MG 24 hr tablet Take 1 tablet (30 mg total) by mouth daily.  Marland Kitchen loperamide (IMODIUM) 2 MG capsule Take 2 mg by mouth 2 (two) times daily.  . meclizine (ANTIVERT) 25 MG tablet Take 25 mg by mouth as needed for dizziness (dizziness).   . metoprolol succinate (TOPROL-XL) 50 MG 24 hr tablet Take 1 tablet by mouth daily.  . montelukast (SINGULAIR) 5 MG chewable tablet Chew 1 tablet (5 mg total) by mouth at bedtime.  . Multiple Vitamin (MULTIVITAMIN WITH MINERALS) TABS Take 1 tablet by mouth daily.  . Nebulizers (COMPRESSOR/NEBULIZER) MISC Use with albuterol  . nitroGLYCERIN (NITROSTAT)  0.4 MG SL tablet Place 0.4 mg under the tongue every 5 (five) minutes x 3 doses as needed. For chest pain.  . ONE TOUCH ULTRA TEST test strip CHECK BLOOD SUGAR TWICE A DAY DX: E11.9  . ONETOUCH DELICA LANCETS 92Z MISC 3 (three) times daily. for testing  . Pancrelipase, Lip-Prot-Amyl, 24000 units CPEP Take 2 capsules (48,000 Units total) by mouth 3  (three) times daily with meals. Take 1 capsule with a snack  . potassium chloride SA (K-DUR,KLOR-CON) 20 MEQ tablet Take 20 mEq by mouth 2 (two) times daily.  . pantoprazole (PROTONIX) 40 MG tablet Take 1 tablet (40 mg total) by mouth daily.  . [DISCONTINUED] neomycin-polymyxin b-dexamethasone (MAXITROL) 3.5-10000-0.1 OINT Apply topically as needed  . [DISCONTINUED] prednisoLONE acetate (PRED FORTE) 1 % ophthalmic suspension Place 1 drop into the left eye 4 (four) times daily.   Facility-Administered Encounter Medications as of 09/15/2015  Medication  . 0.9 %  sodium chloride infusion  . cyanocobalamin ((VITAMIN B-12)) injection 1,000 mcg  :  Review of Systems:  Out of a complete 14 point review of systems, all are reviewed and negative with the exception of these symptoms as listed below:  Review of Systems  Neurological:       Patient states that she is having trouble with her CPAP machine. Reports that she feels that the machine is not registering how much she is using it. Example: last night she wore it all night but machine advised her that she only used it 2 hours.  Patient reports that she is having trouble keeping the mask on.     Objective:  Neurologic Exam  Physical Exam Physical Examination:   Vitals:   09/15/15 1409  BP: (!) 162/90  Pulse: 78  Resp: 16   General Examination: The patient is a very pleasant 75 y.o. female in no acute distress. She appears well-developed and well-nourished and well groomed.   HEENT: Normocephalic, atraumatic, pupils are equal, round and reactive to light and accommodation. Funduscopic exam is normal with sharp disc margins noted. Extraocular tracking is good without limitation to gaze excursion or nystagmus noted. Normal smooth pursuit is noted. Hearing is grossly intact. Face is symmetric with normal facial animation and normal facial sensation. Speech is clear with no dysarthria noted. There is no hypophonia. There is no lip, neck/head,  jaw or voice tremor. Neck is supple with full range of passive and active motion. There are no carotid bruits on auscultation. Oropharynx exam reveals: severe mouth dryness, upper and lower full dentures in place, and moderate airway crowding, due to large tongue, large uvula. Mallampati is class II. Tongue protrudes centrally and palate elevates symmetrically. Tonsils are absent.     Chest: Clear to auscultation without wheezing, rhonchi or crackles noted. She has an unremarkable midline sternotomy scar. She has a couple of bouts of cough.  Heart: S1+S2+0, regular and normal without murmurs, rubs or gallops noted.   Abdomen: Soft, non-tender and non-distended with normal bowel sounds appreciated on auscultation.  Extremities: There is no pitting edema in the distal lower extremities bilaterally. Pedal pulses are intact.  Skin: Warm and dry without trophic changes noted. There are no varicose veins.  Musculoskeletal: exam reveals no obvious joint deformities, tenderness or joint swelling or erythema, with the exception of mild right knee swelling, she has an unremarkable scar from knee replacement surgery.    Neurologically:  Mental status: The patient is awake, alert and oriented in all 4 spheres. Her immediate and remote memory,  attention, language skills and fund of knowledge are appropriate. There is no evidence of aphasia, agnosia, apraxia or anomia. Speech is clear with normal prosody and enunciation. Thought process is linear. Mood is normal and affect is normal.  Cranial nerves II - XII are as described above under HEENT exam. In addition: shoulder shrug is normal with equal shoulder height noted. Motor exam: Normal bulk, strength and tone is noted. There is no drift, tremor or rebound. Romberg is negative. Reflexes are 2+ throughout, except trace in the R knee. Fine motor skills and coordination: intact with normal finger taps, normal hand movements, normal rapid alternating patting, normal  foot taps and normal foot agility.  Cerebellar testing: No dysmetria or intention tremor on finger to nose testing. Heel to shin is unremarkable bilaterally. There is no truncal or gait ataxia.  Sensory exam: intact to light touch in the upper and lower extremities.  Gait, station and balance: She stands with difficulty. No veering to one side is noted. No leaning to one side is noted. Posture is age-appropriate and stance is narrow based. Gait shows normal stride length and normal pace. She has mild insecurity with turns.  Assessment and plan:   In summary, [name] is a very pleasant 75 year old female with an underlying medical history of hyperlipidemia, CAD, s/p 3 vessel CABG, B12 deficiency, hypertension, type 2 diabetes, neck pain, NASH and obesity, who presents for follow-up consultation of her obstructive sleep apnea, with prior diagnosis of OSA and difficulty tolerating CPAP many years ago. She had a baseline sleep study in September 2016, followed by a CPAP titration study in March 2017. We talked about her test results. She is struggling to tolerate CPAP. She reports a discrepancy between the machine registering her usage and her actual usage. She feels that she is using it more consistently but the machine does not seem to register this. She also reports difficulty tolerating her current nasal pillows interface. She is advised to make an appointment with her DME provider for mask refitted troubleshooting her machine as well. Her exam is stable. She has seen Dr. Jaynee Eagles recently and is advised that she can make a follow-up appointment if needed for ongoing issues with dizziness and gait problems. She has been referred to Dr. Nelva Bush and has an appointment this week for neck pain. I suggested a two-month follow-up with one of our nurse practitioners to make sure her CPAP is going better. I will see her back after that for her sleep apnea. I answered all her questions today and she was in  agreement.  I spent 25 minutes in total face-to-face time with the patient, more than 50% of which was spent in counseling and coordination of care, reviewing test results, reviewing medication and discussing or reviewing the diagnosis of OSA, its prognosis and treatment options.

## 2015-09-16 ENCOUNTER — Encounter: Payer: Self-pay | Admitting: *Deleted

## 2015-09-16 NOTE — Telephone Encounter (Signed)
Heather contacted me this morning stating that she left patient 2 vm. Nira Conn will make the patient an appointment to get her machine checked and a mask refit

## 2015-09-18 ENCOUNTER — Ambulatory Visit (HOSPITAL_COMMUNITY): Payer: Medicare Other | Attending: Cardiovascular Disease

## 2015-09-18 ENCOUNTER — Other Ambulatory Visit: Payer: Self-pay

## 2015-09-18 DIAGNOSIS — I2510 Atherosclerotic heart disease of native coronary artery without angina pectoris: Secondary | ICD-10-CM | POA: Diagnosis not present

## 2015-09-18 DIAGNOSIS — H3589 Other specified retinal disorders: Secondary | ICD-10-CM | POA: Diagnosis not present

## 2015-09-18 DIAGNOSIS — I110 Hypertensive heart disease with heart failure: Secondary | ICD-10-CM | POA: Diagnosis not present

## 2015-09-18 DIAGNOSIS — E785 Hyperlipidemia, unspecified: Secondary | ICD-10-CM | POA: Insufficient documentation

## 2015-09-18 DIAGNOSIS — I059 Rheumatic mitral valve disease, unspecified: Secondary | ICD-10-CM | POA: Diagnosis not present

## 2015-09-18 DIAGNOSIS — I509 Heart failure, unspecified: Secondary | ICD-10-CM | POA: Diagnosis not present

## 2015-09-18 DIAGNOSIS — E119 Type 2 diabetes mellitus without complications: Secondary | ICD-10-CM | POA: Insufficient documentation

## 2015-09-18 DIAGNOSIS — I252 Old myocardial infarction: Secondary | ICD-10-CM | POA: Insufficient documentation

## 2015-09-18 DIAGNOSIS — H34219 Partial retinal artery occlusion, unspecified eye: Secondary | ICD-10-CM

## 2015-09-22 ENCOUNTER — Encounter: Payer: Self-pay | Admitting: Gastroenterology

## 2015-09-22 ENCOUNTER — Ambulatory Visit: Payer: Medicare Other

## 2015-09-22 ENCOUNTER — Telehealth: Payer: Self-pay | Admitting: Cardiovascular Disease

## 2015-09-22 NOTE — Telephone Encounter (Signed)
New message       Pt has arthritis in her neck. Because she is on plavix she cannot take the arthritis medication recommended by her doctor.  What medication for arthritis in her neck will Dr Acie Fredrickson recommend?

## 2015-09-22 NOTE — Telephone Encounter (Signed)
Spoke with patient who states her doctor did not want to prescribe pain medication for her arthritis because she takes plavix.  She does not know the name of the medication he was going to prescribe.  I advised that Dr. Acie Fredrickson will not be able to advise her to stop plavix and that Tylenol is the pain medication with the least amount of interaction with plavix.  I advised that per Dr. Acie Fredrickson, she may take an anti-inflammatory medication as needed for pain but that long term use will put her at additional risk for bleeding.  I advised that her arthritis doctor should prescribe something knowing that patient will need to continue plavix and that she may call back with a specific medication question if needed.  She verbalized understanding and agreement.

## 2015-09-24 ENCOUNTER — Telehealth: Payer: Self-pay | Admitting: Cardiovascular Disease

## 2015-09-24 NOTE — Telephone Encounter (Signed)
New message   Pt verbalized that Dr.[facility] Doctor wants to give pt aloxacan and she wants to know if it is okay

## 2015-09-25 ENCOUNTER — Ambulatory Visit: Payer: Medicare Other | Admitting: Gastroenterology

## 2015-09-25 NOTE — Telephone Encounter (Signed)
Left message for patient to call back.  I advised that I believe the medication she is calling to ask about is meloxicam.

## 2015-09-26 NOTE — Telephone Encounter (Signed)
Left message for patient to call back  

## 2015-11-17 ENCOUNTER — Other Ambulatory Visit: Payer: Self-pay | Admitting: Cardiovascular Disease

## 2015-11-17 ENCOUNTER — Encounter: Payer: Self-pay | Admitting: Neurology

## 2015-11-18 ENCOUNTER — Encounter: Payer: Self-pay | Admitting: Adult Health

## 2015-11-18 ENCOUNTER — Ambulatory Visit (INDEPENDENT_AMBULATORY_CARE_PROVIDER_SITE_OTHER): Payer: Medicare Other | Admitting: Adult Health

## 2015-11-18 VITALS — BP 127/76 | HR 88 | Ht 63.0 in | Wt 199.8 lb

## 2015-11-18 DIAGNOSIS — Z9989 Dependence on other enabling machines and devices: Secondary | ICD-10-CM | POA: Diagnosis not present

## 2015-11-18 DIAGNOSIS — G4733 Obstructive sleep apnea (adult) (pediatric): Secondary | ICD-10-CM | POA: Diagnosis not present

## 2015-11-18 NOTE — Patient Instructions (Signed)
Mask refitting today If your symptoms worsen or you develop new symptoms please let us know.

## 2015-11-18 NOTE — Progress Notes (Addendum)
PATIENT: [name] DOB: [birth year]  REASON FOR VISIT: follow up obstructive sleep apnea on CPAP HISTORY FROM: patient  HISTORY OF PRESENT ILLNESS: Today 11/18/2015: [name] is a 75 year old female with a history of obstructive sleep apnea on CPAP. She returns today for a compliance download. Her download indicates that she uses her machine 24 out of 30 days for compliance of 80%. On average she uses her machine greater than 4 hours 12 out of 30 days for compliance of 40%. Her average usage is 3 hours and 52 minutes. Her residual AHI is 10.2 on 9 cm of water with EPR of 3. She has a leak in the 95th percentile at 23.2 L/m. The patient is adamant that she puts machine on when she goes to sleep and does not remove it to the next morning. She states that she typically goes to bed around 11 and wakes up at 5. However according to the download there are some nights she uses the machine greater than 4 hours but there are a few that she does not. She does state that she is a mouth breather. Unsure if she needs a full face mask instead of the nasal pillows or dreamwear. She doesn't feel that she is taking it off during the night as is always on in the morning. She does state there are some nights that she only gets 4 hours or less of sleep. Her fatigue severity score is 22 and Epworth sleepiness score is 1. She returns today for an evaluation.   HISTORY Per Dr. Guadelupe Sabin notes: [name] is a 75 year old right-handed woman with an underlying medical history of hyperlipidemia,CAD, s/p 3 vessel CABG,B12 deficiency, hypertension, type 2 diabetes, neck pain, NASH and obesity, who presents for follow-up consultation of her obstructive sleep apnea, after her sleep studies. The patient is unaccompanied today. I first met her on 10/17/2014 at the request of Dr. Jaynee Eagles, at which time she reported snoring, nonrestorative sleep, morning headaches, and nocturia. I invited her back for sleep study. She had a  baseline sleep study, followed by a CPAP titration study. I went over her test results with her in detail today. Her baseline sleep study from 10/31/2014 showed a sleep efficiency of 83.2%, latency to sleep 39.5 minutes, wake after sleep onset of 30 minutes with mild to moderate sleep fragmentation noted. She had an increased percentage of light stage sleep, absence of slow-wave sleep and REM sleep at 19.1% with a borderline prolonged REM latency. Moderate PLMS were noted at 36.7 per hour, resulting in only 1.2 arousals per hour. She had no significant EKG or EEG changes. She had mild to moderate snoring. Overall AHI was 6.8 per hour. Average oxygen saturations 91%, nadir was 78%, time below 90% saturation was 1 hour and 17 minutes for the night. Based on her medical history and her sleep-related complaints as well as test results I invited her for a full night CPAP titration study. She had this on 04/14/2015. Sleep efficiency was 88.2%, sleep latency 13.5 minutes, wake after sleep onset was 33 minutes with mild to moderate sleep fragmentation noted. She had an increased percentage of stage II sleep, absence of slow-wave sleep and a mildly decreased percentage of REM sleep at 17.8% with a REM latency of 106 minutes. She had mild PLMS with an index of 16.7 per hour, resulting in only 1.6 arousals per hour. Average oxygen saturation was 91%, nadir was 84%. Time below 90% saturation was 2 hours and 27 minutes for  the night, time below 88% saturation was 9 minutes for the night. CPAP was titrated from 5 cm to 9 cm. AHI was 0 per hour on the final pressure with supine REM sleep achieved.   Today, 09/15/2015: I reviewed her CPAP compliance data from 08/12/2015 through 09/10/2015 which is a total of 30 days during which time she used her machine only 18 days with percent used days greater than 4 hours at only 30%, indicating poor compliance with an average usage of 2 hours and 16 minutes, residual AHI 4.5 per hour, leak  at times high, 95th percentile at 22.1 L/m on a pressure of 9 cm with EPR of 3. 90 day compliance from 07/14/2015 through 09/10/2015 showed a compliance percentage of 43%, again suboptimal, residual AHI suboptimal at 7.3 per hour.  Today, 09/15/2015: She reports problems with the machine, it does not seem to register her usage. She has seen Dr. Jaynee Eagles last month, who made a referral to Dr. Nelva Bush for neck pain. She has an appointment this week with him. Patient reports ongoing issues with dizziness and gait problems. She follows closely with her pulmonologist, Dr. Ashok Cordia for her severe asthma and recurrent respiratory infections. She had a colonoscopy last week. She has had issues with diarrhea for about 6 months.   Previously:   10/17/2014: She reports snoring and non-restorative sleep, nocturia of 3-4/night, as well as morning headaches for the past 1+ year. I reviewed your office note from 08/28/2014. She presented with a history of gait disorder, balance problems and fecal incontinence. You suggested a brain MRI without contrast, cervical spine MRI without contrast as well as lumbar spine MRI without contrast. She had these on 09/16/2014:  This is a normal age-appropriate MRI of the brain without contrast. There are no acute findings. Incidental note is made of a "partially empty" sella turcica, unchanged when compared to 2013.  This is an abnormal MRI of the cervical spine showing multilevel degenerative changes as detailed above. The most significant findings are: 1.Stable left greater than right uncovertebral spurring at C3-C4 causing moderate left foraminal narrowing is no nerve root compression but there is some encroachment upon the exiting left C4 nerve root.  2.Mild anterolisthesis at C4-C5 associated with mild facet hypertrophy. There is severe right uncovertebral spurring causing moderate foraminal narrowing. There is no definite nerve root compression though there is some  encroachment upon the right C5 nerve root. The anterolisthesis has progressed slightly when compared to 2006. 3.Mild anterolisthesis of C5-C6 associated with mild facet hypertrophy. There does not appear to be nerve root impingement at this level. The anterolisthesis has progressed slightly when compared to 2006. 4.Disc bulging and uncovertebral spurring associated with moderately severe loss of disc height at C6-C7. There is moderate bilateral foraminal narrowing. Although there is no definite nerve root compression, there is some encroachment upon the exiting C7 nerve roots.  This is an abnormal MRI of the lumbar spine showed the following: 1.Normal conus medullaris and cauda equina. 2.Moderate facet hypertrophy at L3-L4 with joint effusions, severe facet hypertrophy at L4-L5 with large joint effusions and moderate facet hypertrophy at L5-S1 with a small right joint effusion.At L4-L5, there is right greater than left lateral recess stenosis. There does not appear to be any definite nerve root compression though there is some encroachment upon both of the traversing L5 nerve roots. You called the patient with the test results and also suggested further workup through gastroenterology. Of note, she has difficulty falling asleep and staying asleep. This  has been going on for years. Also, noteworthy, over 10 years ago she was diagnosed with obstructive sleep apnea and had a sleep study. Prior sleep test results are not available for my review. She was given a CPAP machine and states that she tried it for perhaps a year but it was a struggle. She had problems with mask fitting and tolerance and finally gave up on it. She would be willing to get tested again and try CPAP again. She lives alone. She is widowed. She has 4 grown children. She has a variable sleep schedule. She has a bedtime of around 11:30 PM and has some difficulty falling asleep. She may take a sleeping pill. She is up after 3 or 4  hours. She also reports significant nocturia of 3-4 times each night. 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 She reads in bed. She really watches TV in bed. She drinks one cup of coffee in the morning and typically no sodas and occasional tea. She is trying to lose weight. After her knee replacement surgery in January of this year she has lost a lot of weight, which was unintended and secondary to severe loss of appetite. She quit smoking 30 years ago. She does not drink alcohol. She denies restless leg symptoms or leg twitching at night. She had a right total shoulder replacement, left rotator cuff surgery, right knee replacement surgery and triple bypass in 2011.   REVIEW OF SYSTEMS: Out of a complete 14 system review of symptoms, the patient complains only of the following symptoms, and all other reviewed systems are negative.  Double vision, wheezing, diarrhea, incontinence of bowels, apnea, joint pain, neck pain, memory loss, dizziness, headache  ALLERGIES: Allergies  Allergen Reactions  . Amlodipine   . Banana Nausea And Vomiting    Stomach pumped  . Co Q10 [Coenzyme Q10]     Body cramps  . Codeine     Hallucinate, loose identity and don't know who I am  . Morphine Other (See Comments)    Can not function, it immobilizes me   . Sulfonamide Derivatives Swelling    HOME MEDICATIONS: Outpatient Medications Prior to Visit  Medication Sig Dispense Refill  . albuterol (PROVENTIL HFA;VENTOLIN HFA) 108 (90 BASE) MCG/ACT inhaler Inhale 2 puffs into the lungs every 6 (six) hours as needed. For shortness of breath. 1 Inhaler 0  . albuterol (PROVENTIL) (2.5 MG/3ML) 0.083% nebulizer solution Take 3 mLs (2.5 mg total) by nebulization every 6 (six) hours as needed. For shortness of breath. 360 mL 11  . ALPRAZolam (XANAX) 0.25 MG tablet Take 0.25 mg by mouth 2 (two) times daily as needed.     . baclofen (LIORESAL) 20 MG tablet Take 1 tablet by mouth 2 (two) times daily.    .  Blood Glucose Monitoring Suppl (ONE TOUCH ULTRA MINI) w/Device KIT 3 (three) times daily. for testing  0  . budesonide (PULMICORT) 0.5 MG/2ML nebulizer solution Take 2 mLs (0.5 mg total) by nebulization 2 (two) times daily. 120 mL 11  . Calcium & Magnesium Carbonates (MYLANTA PO) Take 1 tablet by mouth daily as needed. For indigestion.    . Cholecalciferol (VITAMIN D) 2000 UNITS tablet Take 2,000 Units by mouth daily.     . clopidogrel (PLAVIX) 75 MG tablet Take 1 tablet (75 mg total) by mouth every morning. 90 tablet 3  . Cyanocobalamin (VITAMIN B-12 IJ) Inject as directed every 30 (thirty) days.    . cyclobenzaprine (FLEXERIL)  5 MG tablet Take 1 tablet (5 mg total) by mouth at bedtime. 30 tablet 3  . diltiazem (CARDIZEM CD) 240 MG 24 hr capsule Take 1 capsule (240 mg total) by mouth daily. 30 capsule 10  . diltiazem (CARDIZEM CD) 240 MG 24 hr capsule TAKE ONE CAPSULE BY MOUTH EVERY DAY 30 capsule 6  . doxazosin (CARDURA) 8 MG tablet Take 1 tablet (8 mg total) by mouth daily. 30 tablet 6  . famotidine (PEPCID) 20 MG tablet Take 20 mg by mouth 2 (two) times daily.    . fexofenadine (ALLEGRA) 180 MG tablet Take 1 tablet (180 mg total) by mouth daily. 30 tablet 6  . formoterol (PERFOROMIST) 20 MCG/2ML nebulizer solution Take 2 mLs (20 mcg total) by nebulization 2 (two) times daily. 120 mL 11  . furosemide (LASIX) 40 MG tablet Take 40 mg by mouth every other day.     . insulin lispro protamine-insulin lispro (HUMALOG 75/25) (75-25) 100 UNIT/ML SUSP 18 in am, 27 in evening    . isosorbide mononitrate (IMDUR) 30 MG 24 hr tablet Take 1 tablet (30 mg total) by mouth daily. 31 tablet 11  . loperamide (IMODIUM) 2 MG capsule Take 2 mg by mouth 2 (two) times daily.    . meclizine (ANTIVERT) 25 MG tablet Take 25 mg by mouth as needed for dizziness (dizziness).     . metoprolol succinate (TOPROL-XL) 50 MG 24 hr tablet Take 1 tablet by mouth daily.    . montelukast (SINGULAIR) 5 MG chewable tablet Chew 1 tablet  (5 mg total) by mouth at bedtime. 30 tablet 3  . Multiple Vitamin (MULTIVITAMIN WITH MINERALS) TABS Take 1 tablet by mouth daily.    . Nebulizers (COMPRESSOR/NEBULIZER) MISC Use with albuterol 1 each 0  . nitroGLYCERIN (NITROSTAT) 0.4 MG SL tablet Place 0.4 mg under the tongue every 5 (five) minutes x 3 doses as needed. For chest pain.    . ONE TOUCH ULTRA TEST test strip CHECK BLOOD SUGAR TWICE A DAY DX: E11.9  3  . ONETOUCH DELICA LANCETS 08X MISC 3 (three) times daily. for testing  0  . Pancrelipase, Lip-Prot-Amyl, 24000 units CPEP Take 2 capsules (48,000 Units total) by mouth 3 (three) times daily with meals. Take 1 capsule with a snack 210 capsule 6  . pantoprazole (PROTONIX) 40 MG tablet Take 1 tablet (40 mg total) by mouth daily. 30 tablet 11  . potassium chloride SA (K-DUR,KLOR-CON) 20 MEQ tablet Take 20 mEq by mouth 2 (two) times daily.     Facility-Administered Medications Prior to Visit  Medication Dose Route Frequency Provider Last Rate Last Dose  . 0.9 %  sodium chloride infusion  500 mL Intravenous Continuous Ladene Artist, MD      . cyanocobalamin ((VITAMIN B-12)) injection 1,000 mcg  1,000 mcg Intramuscular Q30 days Binnie Rail, MD   1,000 mcg at 07/21/15 1530    PAST MEDICAL HISTORY: Past Medical History:  Diagnosis Date  . Acute myocardial infarction, unspecified site, episode of care unspecified   . Adenomatous colon polyp   . Anxiety   . Chest pain   . Coronary artery disease   . Diabetes mellitus   . GERD (gastroesophageal reflux disease)   . Hiatal hernia   . Hyperlipidemia   . Hypertension   . PONV (postoperative nausea and vomiting)   . Schatzki's ring   . Shoulder injury    resolved after shoulder surgery  . Unspecified asthma(493.90)   . Upper respiratory symptom  02-13-14 "runny nose, mild cough,sniffles" -instructed to inform MD if symptoms worsens    PAST SURGICAL HISTORY: Past Surgical History:  Procedure Laterality Date  . ABDOMINAL  HYSTERECTOMY    . APPENDECTOMY     came out with Hysterectomy  . CARDIAC CATHETERIZATION  07/23/2009   EF 60%  . CARDIAC CATHETERIZATION  10/11/2008  . CARDIAC CATHETERIZATION  03/01/2007   EF 75-80%  . CARDIOVASCULAR STRESS TEST  11/15/2007   EF 60%  . COLONOSCOPY    . CORONARY ARTERY BYPASS GRAFT     SEVERELY DISEASED SAPHENOUS VEIN GRAFT TO THE RIGHT CORONARY ARTERY BUT WITH FAIRLY WELL PRESERVED FLOW TO THE DISTAL RIGHT CORONARY ARTERY FROM THE NATIVE CIRCULATION-RESTART  CATH IN JUNE 2000, REVEALS MILD/MODERATE  CAD WITH GOOD FLOW DOWN HER LAD  . ESOPHAGEAL DILATION    . ESOPHAGOGASTRODUODENOSCOPY    . EYE SURGERY     bilateral cataract surgery with lens implant  . lense removal Left   . ROTATOR CUFF REPAIR     right and left  . TONSILLECTOMY     age 58  . 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 tumor removed kidney    . US ECHOCARDIOGRAPHY  03/08/2008   EF 55-60%    FAMILY HISTORY: Family History  Problem Relation Age of Onset  . Heart disease Maternal Grandfather   . Heart failure Maternal Grandfather   . Diabetes Maternal Grandfather   . Heart attack Father   . Stomach cancer Father   . Diabetes Mother   . Rheum arthritis Sister   . Emphysema Paternal Uncle   . Esophageal cancer Brother 3    she said he was born with it  . Emphysema Paternal Aunt   . Neuropathy Neg Hx   . Multiple sclerosis Neg Hx   . Colon cancer Neg Hx     SOCIAL HISTORY: Social History   Social History  . Marital status: Widowed    Spouse name: N/A  . Number of children: 4  . Years of education: Doctorate   Occupational History  . Retired    Social History Main Topics  . Smoking status: Former Smoker    Packs/day: 0.50    Years: 21.00    Types: Cigarettes    Start date: 02/09/1955    Quit date: 02/09/1976  . Smokeless tobacco: Never Used  .  Alcohol use No  . Drug use: No  . Sexual activity: Not on file   Other Topics Concern  . Not on file   Social History Narrative   Lives alone.   Caffeine use: Drinks 1 cup coffee/day      Originally from [place]. Previously has lived in [place]. Prior travel to [place], [place], [place], [place], [place], [place], [place], & [place]. Previously worked in [employer] She has a dog currently. No bird, mold, or hot tub exposure. She also pastors a church.       PHYSICAL EXAM  Vitals:   11/18/15 1411  BP: 127/76  Pulse: 88  Weight: 199 lb 12.8 oz (90.6 kg)  Height: '5\' 3"'  (1.6 m)   Body mass index is 35.39 kg/m.  Generalized: Well developed, in no acute distress   Neurological examination  Mentation: Alert oriented to time, place, history taking. Follows all commands speech and language fluent Cranial nerve II-XII: Pupils were equal round reactive to light.  Extraocular movements were full, visual field were full on confrontational test. Facial sensation and strength were normal. Uvula tongue midline. Head turning and shoulder shrug  were normal and symmetric. Motor: The motor testing reveals 5 over 5 strength of all 4 extremities. Good symmetric motor tone is noted throughout.  Sensory: Sensory testing is intact to soft touch on all 4 extremities. No evidence of extinction is noted.  Coordination: Cerebellar testing reveals good finger-nose-finger and heel-to-shin bilaterally.  Gait and station: Gait is normal.  Reflexes: Deep tendon reflexes are symmetric and normal bilaterally.   DIAGNOSTIC DATA (LABS, IMAGING, TESTING) - I reviewed patient records, labs, notes, testing and imaging myself where available.  Lab Results  Component Value Date   WBC 6.2 03/31/2015   HGB 12.2 03/31/2015   HCT 37.3 03/31/2015   MCV 79.0 03/31/2015   PLT 284.0 03/31/2015      Component Value Date/Time   NA 142 03/31/2015 1648   K 4.2 03/31/2015 1648   CL 106 03/31/2015 1648   CO2 31 03/31/2015 1648     GLUCOSE 119 (H) 03/31/2015 1648   BUN 18 03/31/2015 1648   CREATININE 0.82 03/31/2015 1648   CALCIUM 9.3 03/31/2015 1648   PROT 7.3 03/31/2015 1648   ALBUMIN 4.2 03/31/2015 1648   AST 15 03/31/2015 1648   ALT 13 03/31/2015 1648   ALKPHOS 91 03/31/2015 1648   BILITOT 0.3 03/31/2015 1648   GFRNONAA 69 (L) 02/22/2014 0416   GFRAA 80 (L) 02/22/2014 0416    Lab Results  Component Value Date   HGBA1C 7.7 (H) 03/31/2015   Lab Results  Component Value Date   VITAMINB12 253 03/31/2015   Lab Results  Component Value Date   TSH 0.58 03/31/2015      ASSESSMENT AND PLAN 75 y.o. year old female  has a past medical history of Acute myocardial infarction, unspecified site, episode of care unspecified; Adenomatous colon polyp; Anxiety; Chest pain; Coronary artery disease; Diabetes mellitus; GERD (gastroesophageal reflux disease); Hiatal hernia; Hyperlipidemia; Hypertension; PONV (postoperative nausea and vomiting); Schatzki's ring; Shoulder injury; Unspecified asthma(493.90); and Upper respiratory symptom. here with:  1. Obstructive sleep apnea on CPAP  The patient's insurance company is wanting her to return her CPAP because of her usage. The patient is interested in trying a fullface mask before giving up on the machine. She will do a mask refitting today. Our sleep lab manager Shirlean Mylar will look at a download in one week if her usage has improved we will ask her insurance company to approve her keeping the machine for a longer period of time. She will return in 3-4 months for download with Dr. Carmelia Bake, MSN, NP-C 11/18/2015, 2:08 PM [facility] [address] [place], [place] [address] [phone]  I reviewed the above note and documentation by the Nurse Practitioner and agree with the history, physical exam, assessment and plan as outlined above. I was immediately available for face-to-face consultation. Star Age, MD, PhD [facility])

## 2015-11-19 ENCOUNTER — Encounter: Payer: Self-pay | Admitting: Internal Medicine

## 2015-11-19 ENCOUNTER — Ambulatory Visit (INDEPENDENT_AMBULATORY_CARE_PROVIDER_SITE_OTHER): Payer: Medicare Other | Admitting: Internal Medicine

## 2015-11-19 VITALS — BP 138/72 | Temp 98.0°F | Ht 63.0 in | Wt 200.0 lb

## 2015-11-19 DIAGNOSIS — E785 Hyperlipidemia, unspecified: Secondary | ICD-10-CM | POA: Diagnosis not present

## 2015-11-19 DIAGNOSIS — Z23 Encounter for immunization: Secondary | ICD-10-CM | POA: Diagnosis not present

## 2015-11-19 DIAGNOSIS — G4733 Obstructive sleep apnea (adult) (pediatric): Secondary | ICD-10-CM | POA: Diagnosis not present

## 2015-11-19 DIAGNOSIS — I10 Essential (primary) hypertension: Secondary | ICD-10-CM

## 2015-11-19 DIAGNOSIS — K2990 Gastroduodenitis, unspecified, without bleeding: Secondary | ICD-10-CM

## 2015-11-19 DIAGNOSIS — I2510 Atherosclerotic heart disease of native coronary artery without angina pectoris: Secondary | ICD-10-CM

## 2015-11-19 DIAGNOSIS — K219 Gastro-esophageal reflux disease without esophagitis: Secondary | ICD-10-CM

## 2015-11-19 DIAGNOSIS — E109 Type 1 diabetes mellitus without complications: Secondary | ICD-10-CM

## 2015-11-19 DIAGNOSIS — K2970 Gastritis, unspecified, without bleeding: Secondary | ICD-10-CM

## 2015-11-19 MED ORDER — PANTOPRAZOLE SODIUM 40 MG PO TBEC: 40.0000 mg | DELAYED_RELEASE_TABLET | Freq: Two times a day (BID) | ORAL | 11 refills | Status: DC

## 2015-11-19 NOTE — Patient Instructions (Addendum)
  Test(s) ordered today. Your results will be released to [facility] (or called to you) after review, usually within 72hours after test completion. If any changes need to be made, you will be notified at that same time.  All other Health Maintenance issues reviewed.   All recommended immunizations and age-appropriate screenings are up-to-date or discussed.  Flu vaccine administered today.   Medications reviewed and updated.  Changes include increasing the protonix to twice daily.  Stop the pepcid.  Your prescription(s) have been submitted to your pharmacy. Please take as directed and contact our office if you believe you are having problem(s) with the medication(s).   Please followup in 6 months

## 2015-11-19 NOTE — Assessment & Plan Note (Signed)
Should try a new statin and she agrees Will check lipid panel at next visit when fasting Will try a low dose statin after that

## 2015-11-19 NOTE — Progress Notes (Signed)
Pre visit review using our clinic review tool, if applicable. No additional management support is needed unless otherwise documented below in the visit note. 

## 2015-11-19 NOTE — Assessment & Plan Note (Signed)
BP well controlled Current regimen effective and well tolerated Continue current medications at current doses  

## 2015-11-19 NOTE — Progress Notes (Signed)
Subjective:    Patient ID: [name], female    DOB: 01/01/1941, 75 y.o.   MRN: [id number]  HPI The patient is here for follow up.  She is frustrated by her inability to lose weight.  Diabetes, type 1: She is following with Dr Chalmers Cater. She is taking her medication daily as prescribed. She is compliant with a diabetic diet. She is exercising regularly -water aerobics 2-3 times a week. She checks her feet daily and denies foot lesions. She is up-to-date with an ophthalmology examination.   GERD:  She is taking her medication daily as prescribed-Pantoprazole once daily and Pepcid in the evening.  She feels GERD symptoms almost daily.  She is seeing GI.     OSA: She is following with neurology. She has not been using her CPAP machine enough and her insurance company wanted to take the machine back. She is now trying a full face mask.   Hyperlipidemia: She is Not on any cholesterol lowering medication. She was on a statin in the past-does not recall which one, and had severe muscle cramping and spasms. She does not recall trying a different. She is compliant with a low fat/cholesterol diet. She is exercising regularly.   CAD, Hypertension: She is taking her medication daily. She is compliant with a low sodium diet.  She denies chest pain (cardiology feels this is noncardiac) , and lightheadedness. She is exercising regularly.      Diarrhea:  Has seen GI and is taking pancreatic enzymes.  This has helped decrease the stools, but she still has diarrhea 2-3 times a day.  She still has some fecal incontinence.  She has a follow up with GI next month.    She is following with her eye doctor and a specialist for a plaque in the back of her eye.  Medications and allergies reviewed with patient and updated if appropriate.  Patient Active Problem List   Diagnosis Date Noted  . Type 1 diabetes mellitus ([place]) 07/21/2015  . Easy bruising 07/21/2015  . Vitamin B12 deficiency 03/31/2015  . OSA  (obstructive sleep apnea) 11/20/2014  . Fecal incontinence 08/28/2014  . 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 Falls 08/28/2014  . Neck pain of over 3 months duration 08/28/2014  . H/O total knee replacement 02/20/2014  . Obesity (BMI 30-39.9) 11/12/2013  . Chronic diastolic CHF (congestive heart failure) ([place]) 11/17/2012  . Meniscus, lateral, anterior horn derangement 11/10/2011  . Medial meniscus, posterior horn derangement 11/10/2011  . Osteoarthritis of right knee 11/10/2011  . Vertigo 10/05/2011  . HTN (hypertension), benign 10/05/2011  . Coronary artery disease 08/27/2010  . MUSCLE CRAMPS 12/29/2009  . HEART ATTACK 11/19/2009  . Extrinsic asthma 11/19/2009  . GERD 11/19/2009  . Hyperlipidemia 11/18/2009    Current Outpatient Prescriptions on File Prior to Visit  Medication Sig Dispense Refill  . albuterol (PROVENTIL HFA;VENTOLIN HFA) 108 (90 BASE) MCG/ACT inhaler Inhale 2 puffs into the lungs every 6 (six) hours as needed. For shortness of breath. 1 Inhaler 0  . albuterol (PROVENTIL) (2.5 MG/3ML) 0.083% nebulizer solution Take 3 mLs (2.5 mg total) by nebulization every 6 (six) hours as needed. For shortness of breath. 360 mL 11  . ALPRAZolam (XANAX) 0.25 MG tablet Take 0.25 mg by mouth 2 (two) times daily as needed.     . baclofen (LIORESAL) 20 MG tablet Take 1 tablet by mouth 2 (two) times daily.    . Blood Glucose Monitoring Suppl (ONE TOUCH ULTRA MINI) w/Device KIT  3 (three) times daily. for testing  0  . budesonide (PULMICORT) 0.5 MG/2ML nebulizer solution Take 2 mLs (0.5 mg total) by nebulization 2 (two) times daily. 120 mL 11  . Calcium & Magnesium Carbonates (MYLANTA PO) Take 1 tablet by mouth daily as needed. For indigestion.    . Cholecalciferol (VITAMIN D) 2000 UNITS tablet Take 2,000 Units by mouth daily.     . clopidogrel (PLAVIX) 75 MG tablet Take 1 tablet (75 mg total) by mouth every morning. 90 tablet 3  . Cyanocobalamin (VITAMIN B-12 IJ) Inject as directed  every 30 (thirty) days.    . cyclobenzaprine (FLEXERIL) 5 MG tablet Take 1 tablet (5 mg total) by mouth at bedtime. 30 tablet 3  . diltiazem (CARDIZEM CD) 240 MG 24 hr capsule Take 1 capsule (240 mg total) by mouth daily. 30 capsule 10  . doxazosin (CARDURA) 8 MG tablet Take 1 tablet (8 mg total) by mouth daily. 30 tablet 6  . fexofenadine (ALLEGRA) 180 MG tablet Take 1 tablet (180 mg total) by mouth daily. 30 tablet 6  . formoterol (PERFOROMIST) 20 MCG/2ML nebulizer solution Take 2 mLs (20 mcg total) by nebulization 2 (two) times daily. 120 mL 11  . furosemide (LASIX) 40 MG tablet Take 40 mg by mouth every other day.     . insulin lispro protamine-insulin lispro (HUMALOG 75/25) (75-25) 100 UNIT/ML SUSP 25 in am, 30 in evening    . isosorbide mononitrate (IMDUR) 30 MG 24 hr tablet Take 1 tablet (30 mg total) by mouth daily. 31 tablet 11  . loperamide (IMODIUM) 2 MG capsule Take 2 mg by mouth as needed.     . meclizine (ANTIVERT) 25 MG tablet Take 25 mg by mouth as needed for dizziness (dizziness).     . metoprolol succinate (TOPROL-XL) 50 MG 24 hr tablet Take 1 tablet by mouth daily.    . montelukast (SINGULAIR) 5 MG chewable tablet Chew 1 tablet (5 mg total) by mouth at bedtime. 30 tablet 3  . Multiple Vitamin (MULTIVITAMIN WITH MINERALS) TABS Take 1 tablet by mouth daily.    . Nebulizers (COMPRESSOR/NEBULIZER) MISC Use with albuterol 1 each 0  . nitroGLYCERIN (NITROSTAT) 0.4 MG SL tablet Place 0.4 mg under the tongue every 5 (five) minutes x 3 doses as needed. For chest pain.    . ONE TOUCH ULTRA TEST test strip CHECK BLOOD SUGAR TWICE A DAY DX: E11.9  3  . ONETOUCH DELICA LANCETS 70W MISC 3 (three) times daily. for testing  0  . Pancrelipase, Lip-Prot-Amyl, 24000 units CPEP Take 2 capsules (48,000 Units total) by mouth 3 (three) times daily with meals. Take 1 capsule with a snack 210 capsule 6  . potassium chloride SA (K-DUR,KLOR-CON) 20 MEQ tablet Take 20 mEq by mouth 2 (two) times daily.      Current Facility-Administered Medications on File Prior to Visit  Medication Dose Route Frequency Provider Last Rate Last Dose  . cyanocobalamin ((VITAMIN B-12)) injection 1,000 mcg  1,000 mcg Intramuscular Q30 days Binnie Rail, MD   1,000 mcg at 07/21/15 1530    Past Medical History:  Diagnosis Date  . Acute myocardial infarction, unspecified site, episode of care unspecified   . Adenomatous colon polyp   . Anxiety   . Chest pain   . Coronary artery disease   . Diabetes mellitus   . GERD (gastroesophageal reflux disease)   . Hiatal hernia   . Hyperlipidemia   . Hypertension   . PONV (postoperative nausea and  vomiting)   . Schatzki's ring   . Shoulder injury    resolved after shoulder surgery  . Unspecified asthma(493.90)   . Upper respiratory symptom    02-13-14 "runny nose, mild cough,sniffles" -instructed to inform MD if symptoms worsens    Past Surgical History:  Procedure Laterality Date  . ABDOMINAL HYSTERECTOMY    . APPENDECTOMY     came out with Hysterectomy  . CARDIAC CATHETERIZATION  07/23/2009   EF 60%  . CARDIAC CATHETERIZATION  10/11/2008  . CARDIAC CATHETERIZATION  03/01/2007   EF 75-80%  . CARDIOVASCULAR STRESS TEST  11/15/2007   EF 60%  . COLONOSCOPY    . CORONARY ARTERY BYPASS GRAFT     SEVERELY DISEASED SAPHENOUS VEIN GRAFT TO THE RIGHT CORONARY ARTERY BUT WITH FAIRLY WELL PRESERVED FLOW TO THE DISTAL RIGHT CORONARY ARTERY FROM THE NATIVE CIRCULATION-RESTART  CATH IN JUNE 2000, REVEALS MILD/MODERATE  CAD WITH GOOD FLOW DOWN HER LAD  . ESOPHAGEAL DILATION    . ESOPHAGOGASTRODUODENOSCOPY    . EYE SURGERY     bilateral cataract surgery with lens implant  . lense removal Left   . ROTATOR CUFF REPAIR     right and left  . TONSILLECTOMY     age 37  . 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 tumor removed kidney    .  US ECHOCARDIOGRAPHY  03/08/2008   EF 55-60%    Social History   Social History  . Marital status: Widowed    Spouse name: N/A  . Number of children: 4  . Years of education: Doctorate   Occupational History  . Retired    Social History Main Topics  . Smoking status: Former Smoker    Packs/day: 0.50    Years: 21.00    Types: Cigarettes    Start date: 02/09/1955    Quit date: 02/09/1976  . Smokeless tobacco: Never Used  . Alcohol use No  . Drug use: No  . Sexual activity: Not on file   Other Topics Concern  . Not on file   Social History Narrative   Lives alone.   Caffeine use: Drinks 1 cup coffee/day      Originally from [place]. Previously has lived in [place]. Prior travel to [place], [place], [place], [place], [place], [place], [place], & [place]. Previously worked in [employer] She has a dog currently. No bird, mold, or hot tub exposure. She also pastors a church.     Family History  Problem Relation Age of Onset  . Heart disease Maternal Grandfather   . Heart failure Maternal Grandfather   . Diabetes Maternal Grandfather   . Heart attack Father   . Stomach cancer Father   . Diabetes Mother   . Rheum arthritis Sister   . Emphysema Paternal Uncle   . Esophageal cancer Brother 37    she said he was born with it  . Emphysema Paternal Aunt   . Neuropathy Neg Hx   . Multiple sclerosis Neg Hx   . Colon cancer Neg Hx     Review of Systems  Constitutional: Negative for fever.  Respiratory: Positive for shortness of breath (related to asthma) and wheezing (daily now - weith weather changes). Negative for cough.   Cardiovascular: Positive for chest pain (cardiology did not feel it was heart related) and leg swelling (feet). Negative for palpitations.  Neurological:  Positive for dizziness (takes meclizine which helps - occurs when standing up) and headaches. Negative for light-headedness.       Objective:   Vitals:   11/19/15 1135  BP: 138/72  Temp: 98 F (36.7 C)    Filed Weights   11/19/15 1135  Weight: 200 lb (90.7 kg)   Body mass index is 35.43 kg/m.   Physical Exam    Constitutional: Appears well-developed and well-nourished. No distress.  HENT:  Head: Normocephalic and atraumatic.  Neck: Neck supple. No tracheal deviation present. No thyromegaly present.  No cervical lymphadenopathy Cardiovascular: Normal rate, regular rhythm and normal heart sounds.   No murmur heard. No carotid bruit .  No edema Pulmonary/Chest: Effort normal and breath sounds normal. No respiratory distress. No has no wheezes. No rales.  Skin: Skin is warm and dry. Not diaphoretic.  Psychiatric: Normal mood and affect. Behavior is normal.     Assessment & Plan:    See Problem List for Assessment and Plan of chronic medical problems.

## 2015-11-19 NOTE — Assessment & Plan Note (Signed)
Trying a full facemask Has follow-up with neurology

## 2015-11-19 NOTE — Assessment & Plan Note (Signed)
Sugars have been higher since stopping invokana, Will call endocrine for advice on medication changes Increase exercise

## 2015-11-19 NOTE — Assessment & Plan Note (Addendum)
Has frequent GERD symptoms Taking protonix once daily and pepcid at night D/c pepcid Increase protonix to BID [facility] Will discuss with GI

## 2015-12-05 ENCOUNTER — Encounter: Payer: Self-pay | Admitting: Pulmonary Disease

## 2015-12-05 ENCOUNTER — Ambulatory Visit (INDEPENDENT_AMBULATORY_CARE_PROVIDER_SITE_OTHER): Payer: Medicare Other | Admitting: Pulmonary Disease

## 2015-12-05 DIAGNOSIS — J209 Acute bronchitis, unspecified: Secondary | ICD-10-CM | POA: Insufficient documentation

## 2015-12-05 DIAGNOSIS — J4541 Moderate persistent asthma with (acute) exacerbation: Secondary | ICD-10-CM

## 2015-12-05 MED ORDER — AZITHROMYCIN 250 MG PO TABS: ORAL_TABLET | ORAL | 0 refills | Status: DC

## 2015-12-05 MED ORDER — PREDNISONE 5 MG PO TABS: ORAL_TABLET | ORAL | 0 refills | Status: DC

## 2015-12-05 NOTE — Patient Instructions (Signed)
Zpak  Prednisone 10 mg tabs  Take 2 tabs daily with food x 5ds, then 1 tab daily with food x 5ds then STOP  Delsym cough syrup 5 ML twice daily as needed for cough

## 2015-12-05 NOTE — Assessment & Plan Note (Signed)
She will continue on her Pulmicort /Perforomist and albuterol via nebs She should ideally be on a steroid/LABA combination but has not been able to afford this

## 2015-12-05 NOTE — Assessment & Plan Note (Signed)
Zpak  Prednisone 10 mg tabs  Take 2 tabs daily with food x 5ds, then 1 tab daily with food x 5ds then STOP  Delsym cough syrup 5 ML twice daily as needed for cough

## 2015-12-05 NOTE — Progress Notes (Signed)
   Subjective:    Patient ID: [name], female    DOB: February 18, 1940, 75 y.o.   MRN: [id number]  HPI   Chief Complaint  Patient presents with  . Acute Visit    JN pt c/o increased SOB, headache, continual prod cough with yellow mucus X3 days.     75 year old with  persistent asthma, allergic rhinitis and mild OSA She was started on Advair on her last visit -but could not obtain this, since she had to spend money on her insulin prescription.  She reports 3 days of sore throat followed by cough productive of yellow-green sputum, one episode of fever that has now resolved she reports intermittent wheezing  She has been taking her albuterol nebulizer 3-4 times daily and has been compliant with Pulmicort and Perforomist   Review of Systems  Positive for sore throat and fever  neg for any significant  dysphagia, itching, sneezing, nasal congestion or excess/ purulent secretions,  chills, sweats, unintended wt loss, pleuritic or exertional cp, hempoptysis, orthopnea pnd or change in chronic leg swelling.   Also denies presyncope, palpitations, heartburn, abdominal pain, nausea, vomiting, diarrhea or change in bowel or urinary habits, dysuria,hematuria, rash, arthralgias, visual complaints, headache, numbness weakness or ataxia.     Objective:   Physical Exam  Gen. Pleasant, well-nourished, in no distress, normal affect ENT - no lesions, no post nasal drip, class 2 airway Neck: No JVD, no thyromegaly, no carotid bruits Lungs: no use of accessory muscles, no dullness to percussion, clear without rales or rhonchi  Cardiovascular: Rhythm regular, heart sounds  normal, no murmurs or gallops, no peripheral edema Abdomen: soft and non-tender, no hepatosplenomegaly, BS normal. Musculoskeletal: No deformities, no cyanosis or clubbing Neuro:  alert, non focal       Assessment & Plan:

## 2015-12-29 ENCOUNTER — Ambulatory Visit (INDEPENDENT_AMBULATORY_CARE_PROVIDER_SITE_OTHER): Payer: Medicare Other | Admitting: Cardiovascular Disease

## 2015-12-29 ENCOUNTER — Encounter: Payer: Self-pay | Admitting: Cardiovascular Disease

## 2015-12-29 VITALS — BP 138/84 | HR 78 | Ht 63.0 in | Wt 203.8 lb

## 2015-12-29 DIAGNOSIS — I25119 Atherosclerotic heart disease of native coronary artery with unspecified angina pectoris: Secondary | ICD-10-CM

## 2015-12-29 MED ORDER — ISOSORBIDE MONONITRATE ER 60 MG PO TB24: 60.0000 mg | ORAL_TABLET | Freq: Every day | ORAL | 11 refills | Status: DC

## 2015-12-29 NOTE — Patient Instructions (Signed)
Medication Instructions:  INCREASE Imdur (Isosorbide) to 60 mg once daily   Labwork: None Ordered   Testing/Procedures: Your physician has requested that you have a lexiscan myoview. For further information please visit [email or web address]. Please follow instruction sheet, as given.    Follow-Up: Your physician wants you to follow-up in: 6 months with Dr. Acie Fredrickson.  You will receive a reminder letter in the mail two months in advance. If you don't receive a letter, please call our office to schedule the follow-up appointment.   If you need a refill on your cardiac medications before your next appointment, please call your pharmacy.   Thank you for choosing [facility]! Christen Bame, RN [phone]

## 2015-12-29 NOTE — Progress Notes (Signed)
Cardiology Office Note   Date:  12/29/2015   ID:  [name], DOB [birth year], MRN [id number]  PCP:  Binnie Rail, MD  Cardiologist:   Mertie Moores, MD   Chief Complaint  Patient presents with  . Coronary Artery Disease   1. Coronary artery disease-status post CABG, the saphenous vein graft to the right coronary artery is severely diseased but she has good flow to the distal right coronary artery from the native circulation. 2. Diabetes mellitus 3. Hypertension 4. Hyperlipidemia 5. COPD: June, 2013 - FEV1 equals 0.89 L which is 48% of predicted value. Her DLCO is also markedly depressed. 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   [name] is a 75 y.o. female with the above noted hx. She continues to have episodes of sharp pain ( few seconds) followed by a burning pain ( last 6-7 minutes). She usually does not take NTG because it causes a headache. She was started on Lasix earlier this year. She seems to be doing much better on Lasix. She's had only function test which revealed severe obstructive lung disease with a good initial response to bronchodilators.  She is scheduled to have surgery all right knee. She's here today for preoperative evaluation prior to having the surgery.  September 04, 2012:  [name] is seen today for a follow up visit. She saw Richardson Dopp in April and was started on bystolic. She has tolerated that fairly well. She has some episodes of CP that are constant. She is very stable.   03/01/2013:  She continues to have significant CP. Still short of breath. She has no energy. Just does not feel well in general.  She does not get much sleep - perhaps 3 hours a night.   Jan. 8, 2016:  [name] is a 75 yo who we follow for CAD, DM, HTN, hyperlipidemia, . She has COPD: FEV1 equals 0.89 L which is 48% of predicted value. Her DLCO is also markedly depressed. She has severe obstructive lung disease with a  beneficial response to bronchodilators.  Breathing is the same. Has some upper respiratory issues.  Still has some tightness.    Jun 17, 2014:    [name] is a 75 y.o. female who presents for episodes of CP    Started 1 1/2 months ago.  Has tried mylanta - thought it was gas.  Did not get any better Occurs wjith exertion or at rest. , left sided chest pain   No radiation Gets lightheaded. Has tried NTG which seems to help.    Nov. 15 2016:  Has not been feeling well for the past couple of months Has a chest pressure when she gets out of bed.  Occurs twice a day - in the AM when she gets up  And then again at 3:30 in the afternoon.   Is active between those times , makes home visit.   Does water aerobics without any CP / pressure.   Wears home O2 at night.     Jun 23, 2015: Doing the same.  Still has the same chest pain  Last cath was in 2014 - showed patent LIMA to LAD, SVG to D1, the SVG to RCA was occluded   Native RCA has a 40-50% prox stenosis   The pains occur typically twice a day - Upon awakening and around 7 PM.   Nov. 20, 2017:  Has had lots of fatigue / pressure in her chest .  Increased shortness of breath walking a short distance  Has COPD - had issues with bronchitis ,  Was on prednisone  Pain was relieved with NTG and also by taking additional aspirin   Past Medical History:  Diagnosis Date  . Acute myocardial infarction, unspecified site, episode of care unspecified   . Adenomatous colon polyp   . Anxiety   . Chest pain   . Coronary artery disease   . Diabetes mellitus   . GERD (gastroesophageal reflux disease)   . Hiatal hernia   . Hyperlipidemia   . Hypertension   . PONV (postoperative nausea and vomiting)   . Schatzki's ring   . Shoulder injury    resolved after shoulder surgery  . Unspecified asthma(493.90)   . Upper respiratory symptom    02-13-14 "runny nose, mild cough,sniffles" -instructed to inform MD if symptoms worsens    Past  Surgical History:  Procedure Laterality Date  . ABDOMINAL HYSTERECTOMY    . APPENDECTOMY     came out with Hysterectomy  . CARDIAC CATHETERIZATION  07/23/2009   EF 60%  . CARDIAC CATHETERIZATION  10/11/2008  . CARDIAC CATHETERIZATION  03/01/2007   EF 75-80%  . CARDIOVASCULAR STRESS TEST  11/15/2007   EF 60%  . COLONOSCOPY    . CORONARY ARTERY BYPASS GRAFT     SEVERELY DISEASED SAPHENOUS VEIN GRAFT TO THE RIGHT CORONARY ARTERY BUT WITH FAIRLY WELL PRESERVED FLOW TO THE DISTAL RIGHT CORONARY ARTERY FROM THE NATIVE CIRCULATION-RESTART  CATH IN JUNE 2000, REVEALS MILD/MODERATE  CAD WITH GOOD FLOW DOWN HER LAD  . ESOPHAGEAL DILATION    . ESOPHAGOGASTRODUODENOSCOPY    . EYE SURGERY     bilateral cataract surgery with lens implant  . lense removal Left   . ROTATOR CUFF REPAIR     right and left  . TONSILLECTOMY     age 49  . 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 tumor removed kidney    . US ECHOCARDIOGRAPHY  03/08/2008   EF 55-60%     Current Outpatient Prescriptions  Medication Sig Dispense Refill  . albuterol (PROVENTIL HFA;VENTOLIN HFA) 108 (90 BASE) MCG/ACT inhaler Inhale 2 puffs into the lungs every 6 (six) hours as needed. For shortness of breath. 1 Inhaler 0  . albuterol (PROVENTIL) (2.5 MG/3ML) 0.083% nebulizer solution Take 3 mLs (2.5 mg total) by nebulization every 6 (six) hours as needed. For shortness of breath. 360 mL 11  . ALPRAZolam (XANAX) 0.5 MG tablet Take 0.5 mg by mouth 2 (two) times daily as needed.    Marland Kitchen azithromycin (ZITHROMAX) 250 MG tablet Take 2 today, then 1 daily until gone. 6 tablet 0  . baclofen (LIORESAL) 20 MG tablet Take 1 tablet by mouth 2 (two) times daily.    . benzonatate (TESSALON) 100 MG capsule Take 100 mg by mouth 3 (three) times daily as needed for cough.    . Blood Glucose Monitoring Suppl (ONE TOUCH ULTRA  MINI) w/Device KIT 3 (three) times daily. for testing  0  . budesonide (PULMICORT) 0.5 MG/2ML nebulizer solution Take 2 mLs (0.5 mg total) by nebulization 2 (two) times daily. 120 mL 11  . Calcium & Magnesium Carbonates (MYLANTA PO) Take 1 tablet by mouth daily as needed. For indigestion.    . Cholecalciferol (VITAMIN D) 2000 UNITS tablet Take 2,000 Units by mouth daily.     Marland Kitchen  clopidogrel (PLAVIX) 75 MG tablet Take 1 tablet (75 mg total) by mouth every morning. 90 tablet 3  . Cyanocobalamin (VITAMIN B-12 IJ) Inject as directed every 30 (thirty) days.    . cyclobenzaprine (FLEXERIL) 5 MG tablet Take 1 tablet (5 mg total) by mouth at bedtime. 30 tablet 3  . diltiazem (CARDIZEM CD) 240 MG 24 hr capsule Take 1 capsule (240 mg total) by mouth daily. 30 capsule 10  . doxazosin (CARDURA) 8 MG tablet Take 1 tablet (8 mg total) by mouth daily. 30 tablet 6  . fexofenadine (ALLEGRA) 180 MG tablet Take 1 tablet (180 mg total) by mouth daily. 30 tablet 6  . formoterol (PERFOROMIST) 20 MCG/2ML nebulizer solution Take 2 mLs (20 mcg total) by nebulization 2 (two) times daily. 120 mL 11  . furosemide (LASIX) 40 MG tablet Take 40 mg by mouth every other day.     . insulin lispro protamine-insulin lispro (HUMALOG 75/25) (75-25) 100 UNIT/ML SUSP 25 in am, 30 in evening    . isosorbide mononitrate (IMDUR) 30 MG 24 hr tablet Take 1 tablet (30 mg total) by mouth daily. 31 tablet 11  . loperamide (IMODIUM) 2 MG capsule Take 2 mg by mouth as needed.     . meclizine (ANTIVERT) 25 MG tablet Take 25 mg by mouth as needed for dizziness (dizziness).     . metoprolol succinate (TOPROL-XL) 50 MG 24 hr tablet Take 1 tablet by mouth daily.    . montelukast (SINGULAIR) 5 MG chewable tablet Chew 1 tablet (5 mg total) by mouth at bedtime. 30 tablet 3  . Multiple Vitamin (MULTIVITAMIN WITH MINERALS) TABS Take 1 tablet by mouth daily.    . Nebulizers (COMPRESSOR/NEBULIZER) MISC Use with albuterol 1 each 0  . nitroGLYCERIN (NITROSTAT)  0.4 MG SL tablet Place 0.4 mg under the tongue every 5 (five) minutes x 3 doses as needed. For chest pain.    . ONE TOUCH ULTRA TEST test strip CHECK BLOOD SUGAR TWICE A DAY DX: E11.9  3  . ONETOUCH DELICA LANCETS 75I MISC 3 (three) times daily. for testing  0  . Pancrelipase, Lip-Prot-Amyl, 24000 units CPEP Take 2 capsules (48,000 Units total) by mouth 3 (three) times daily with meals. Take 1 capsule with a snack 210 capsule 6  . pantoprazole (PROTONIX) 40 MG tablet Take 1 tablet (40 mg total) by mouth 2 (two) times daily before a meal. 60 tablet 11  . potassium chloride SA (K-DUR,KLOR-CON) 20 MEQ tablet Take 20 mEq by mouth 2 (two) times daily.     Current Facility-Administered Medications  Medication Dose Route Frequency Provider Last Rate Last Dose  . cyanocobalamin ((VITAMIN B-12)) injection 1,000 mcg  1,000 mcg Intramuscular Q30 days Binnie Rail, MD   1,000 mcg at 07/21/15 1530    Allergies:   Amlodipine; Banana; Co q10 [coenzyme q10]; Codeine; Morphine; Statins; and Sulfonamide derivatives    Social History:  The patient  reports that she quit smoking about 39 years ago. Her smoking use included Cigarettes. She started smoking about 60 years ago. She has a 10.50 pack-year smoking history. She has never used smokeless tobacco. She reports that she does not drink alcohol or use drugs.   Family History:  The patient's family history includes Diabetes in her maternal grandfather and mother; Emphysema in her paternal aunt and paternal uncle; Esophageal cancer (age of onset: 73) in her brother; Heart attack in her father; Heart disease in her maternal grandfather; Heart failure in her maternal grandfather; Rheum  arthritis in her sister; Stomach cancer in her father.    ROS:  Please see the history of present illness.    Review of Systems: Constitutional:  denies fever, chills, diaphoresis, appetite change and fatigue.  HEENT: denies photophobia, eye pain, redness, hearing loss, ear pain,  congestion, sore throat, rhinorrhea, sneezing, neck pain, neck stiffness and tinnitus.  Respiratory: denies SOB, DOE, cough, chest tightness, and wheezing.  Cardiovascular: denies chest pain, palpitations and mild  leg swelling.  Gastrointestinal: denies nausea, vomiting, abdominal pain, diarrhea, constipation, blood in stool.  Genitourinary: denies dysuria, urgency, frequency, hematuria, flank pain and difficulty urinating.  Musculoskeletal: denies  myalgias, back pain, joint swelling, arthralgias and gait problem.   Skin: denies pallor, rash and wound.  Neurological: denies dizziness, seizures, syncope, weakness, light-headedness, numbness and headaches.   Hematological: denies adenopathy, easy bruising, personal or family bleeding history.  Psychiatric/ Behavioral: denies suicidal ideation, mood changes, confusion, nervousness, sleep disturbance and agitation.       All other systems are reviewed and negative.    PHYSICAL EXAM: VS:  BP 138/84 (BP Location: Right Arm, Patient Position: Sitting, Cuff Size: Large)   Pulse 78   Ht _0  (1.6 m)   Wt 203 lb 12.8 oz (92.4 kg)   BMI 36.10 kg/m  , BMI Body mass index is 36.1 kg/m. GEN: Well nourished, well developed, in no acute distress  HEENT: normal  Neck: no JVD, carotid bruits, or masses Cardiac: RRR; no murmurs, rubs, or gallops,no edema  Respiratory:  clear to auscultation bilaterally, normal work of breathing GI: soft, nontender, nondistended, + BS MS: no deformity or atrophy  Skin: warm and dry, no rash Neuro:  Strength and sensation are intact Psych: normal   EKG:  EKG is ordered today. The ekg ordered today demonstrates  NSR at 59.  RBBB.    Recent Labs: 03/31/2015: ALT 13; BUN 18; Creatinine, Ser 0.82; Hemoglobin 12.2; Magnesium 2.2; Platelets 284.0; Potassium 4.2; Sodium 142; TSH 0.58    Lipid Panel    Component Value Date/Time   CHOL 161 10/06/2011 0500   TRIG 109 10/06/2011 0500   HDL 37 (L) 10/06/2011 0500     CHOLHDL 4.4 10/06/2011 0500   VLDL 22 10/06/2011 0500   LDLCALC 102 (H) 10/06/2011 0500      Wt Readings from Last 3 Encounters:  12/29/15 203 lb 12.8 oz (92.4 kg)  12/05/15 199 lb (90.3 kg)  11/19/15 200 lb (90.7 kg)      Other studies Reviewed: Additional studies/ records that were reviewed today include: . Review of the above records demonstrates:    ASSESSMENT AND PLAN:  1. Coronary artery disease-status post CABG, the saphenous vein graft to the right coronary artery is severely diseased but she has good flow to the distal right coronary artery from the native circulation. She presents with recurrent CP - different from her previous CP .   She has had these pains off and on since her CABG.  Will increase the Imdur to 60 mg a day .  Will get a [employer] . She has known CAD with atresia of her LIMA to LAD    2. Diabetes mellitus 3. Hypertension 4. Hyperlipidemia 5. COPD: June, 2013 - FEV1 equals 0.89 L which is 48% of predicted value. Her DLCO is also markedly depressed. 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   Current medicines are reviewed at length with the patient today.  The patient does not have concerns  regarding medicines.  The following changes have been made:  no change  Labs/ tests ordered today include:  No orders of the defined types were placed in this encounter.    Disposition:   FU with me in 6 months       Mertie Moores, MD  12/29/2015 12:30 PM    [place] [place] [address], [place], [place]  [address] Phone: [phone]; Fax: [phone]   [facility]  [address] [place], [place]  [address] [phone]    Fax [phone]

## 2016-01-07 ENCOUNTER — Telehealth (HOSPITAL_COMMUNITY): Payer: Self-pay | Admitting: *Deleted

## 2016-01-07 NOTE — Telephone Encounter (Signed)
Left message on voicemail in reference to upcoming appointment scheduled for 01/12/16 Phone number given for a call back so details instructions can be given. [name]

## 2016-01-12 ENCOUNTER — Ambulatory Visit (HOSPITAL_COMMUNITY): Payer: Medicare Other | Attending: Cardiology

## 2016-01-12 DIAGNOSIS — I25119 Atherosclerotic heart disease of native coronary artery with unspecified angina pectoris: Secondary | ICD-10-CM

## 2016-01-12 DIAGNOSIS — I4510 Unspecified right bundle-branch block: Secondary | ICD-10-CM | POA: Diagnosis not present

## 2016-01-12 DIAGNOSIS — R0609 Other forms of dyspnea: Secondary | ICD-10-CM | POA: Insufficient documentation

## 2016-01-12 DIAGNOSIS — R079 Chest pain, unspecified: Secondary | ICD-10-CM | POA: Diagnosis not present

## 2016-01-12 DIAGNOSIS — I25810 Atherosclerosis of coronary artery bypass graft(s) without angina pectoris: Secondary | ICD-10-CM | POA: Diagnosis present

## 2016-01-12 LAB — MYOCARDIAL PERFUSION IMAGING
CHL CUP NUCLEAR SSS: 3
LV sys vol: 33 mL
LVDIAVOL: 88 mL (ref 46–106)
Peak HR: 96 {beats}/min
RATE: 0.3
Rest HR: 74 {beats}/min
SDS: 3
SRS: 0
TID: 0.79

## 2016-01-12 MED ORDER — REGADENOSON 0.4 MG/5ML IV SOLN
0.4000 mg | Freq: Once | INTRAVENOUS | Status: AC
  Administered 2016-01-12: 0.4 mg via INTRAVENOUS

## 2016-01-12 MED ORDER — TECHNETIUM TC 99M TETROFOSMIN IV KIT
10.1000 | PACK | Freq: Once | INTRAVENOUS | Status: AC | PRN
  Administered 2016-01-12: 10.1 via INTRAVENOUS
  Filled 2016-01-12: qty 11

## 2016-01-12 MED ORDER — AMINOPHYLLINE 25 MG/ML IV SOLN
75.0000 mg | Freq: Once | INTRAVENOUS | Status: AC
  Administered 2016-01-12: 75 mg via INTRAVENOUS

## 2016-01-12 MED ORDER — TECHNETIUM TC 99M TETROFOSMIN IV KIT
31.3000 | PACK | Freq: Once | INTRAVENOUS | Status: AC | PRN
  Administered 2016-01-12: 31.3 via INTRAVENOUS
  Filled 2016-01-12: qty 32

## 2016-02-13 ENCOUNTER — Ambulatory Visit (INDEPENDENT_AMBULATORY_CARE_PROVIDER_SITE_OTHER): Payer: Medicare Other | Admitting: Pulmonary Disease

## 2016-02-13 ENCOUNTER — Encounter: Payer: Self-pay | Admitting: Pulmonary Disease

## 2016-02-13 VITALS — BP 138/82 | HR 84 | Ht 63.0 in | Wt 204.6 lb

## 2016-02-13 DIAGNOSIS — J309 Allergic rhinitis, unspecified: Secondary | ICD-10-CM | POA: Diagnosis not present

## 2016-02-13 DIAGNOSIS — K219 Gastro-esophageal reflux disease without esophagitis: Secondary | ICD-10-CM

## 2016-02-13 DIAGNOSIS — J4550 Severe persistent asthma, uncomplicated: Secondary | ICD-10-CM

## 2016-02-13 MED ORDER — FORMOTEROL FUMARATE 20 MCG/2ML IN NEBU: 20.0000 ug | INHALATION_SOLUTION | Freq: Two times a day (BID) | RESPIRATORY_TRACT | 0 refills | Status: DC

## 2016-02-13 MED ORDER — BUDESONIDE 90 MCG/ACT IN AEPB: 2.0000 | INHALATION_SPRAY | Freq: Two times a day (BID) | RESPIRATORY_TRACT | 0 refills | Status: DC

## 2016-02-13 NOTE — Patient Instructions (Signed)
   We are giving you Pulmicort in an inhaler to replace your nebulizer solution for the time being.  Inhale 2 puffs twice daily of the Pulmicort 57mcg inhaler - make sure you rinse, gargle, & spit afterward.  Call me if you have any new breathing problems before your next appointment.  I am putting in a referral to [facility] which we are a part of to see if they can help you with your medications.  I will see you back in 6 months or sooner if needed.

## 2016-02-13 NOTE — Addendum Note (Signed)
Addended by: Parke Poisson E on: 02/13/2016 04:05 PM   Modules accepted: Orders

## 2016-02-13 NOTE — Progress Notes (Signed)
Subjective:    Patient ID: [name], female    DOB: October 19, 1940, 76 y.o.   MRN: [id number]  C.C.:  Follow-up for Severe, Persistent Asthma w/ Fixed Airway Obstruction, Chronic Allergic Rhinitis, GERD, & Mild-Moderate OSA.  HPI  Since last appointment patient seen in October by Dr. Elsworth Soho for acute bronchitis and was prescribed a Z-Pak as well as prednisone taper.  Severe, Persistent Asthma w/ Fixed Airway Obstruction:  Prescribed Perforomist and Pulmicort. She hasn't been able to use her inhaled medications at all due to the cost with her insulin. She reports her breathing has been worse the last month. She has continued to have a nonproductive cough. She is wheezing daily. She is waking up at night wheezing.   Chronic Allergic Rhinitis:  Prescribed Singulair & Allegra 153m. She reports constant drainage. No sinus pressure or pain.   GERD:   Follows with GI. Currently on Pepcid bid & Protonix qAM. Compliant with her regimen. Reports her reflux & dyspepsia are well controlled. She does have morning brash water taste.   Mild-Moderate OSA:  Being followed by Dr. ARexene Albertswith Neurology.   Review of Systems She has had chest tightness. She denies any frank pain or pressure. No fever or chills. No diarrhea, nausea, emesis, or abdominal.    Allergies  Allergen Reactions  . Amlodipine   . Banana Nausea And Vomiting    Stomach pumped  . Co Q10 [Coenzyme Q10]     Body cramps  . Codeine     Hallucinate, loose identity and don't know who I am  . Morphine Other (See Comments)    Can not function, it immobilizes me   . Statins     Muscle cramps  . Sulfonamide Derivatives Swelling   Current Outpatient Prescriptions on File Prior to Visit  Medication Sig Dispense Refill  . ALPRAZolam (XANAX) 0.5 MG tablet Take 0.5 mg by mouth 2 (two) times daily as needed.    . baclofen (LIORESAL) 20 MG tablet Take 1 tablet by mouth 2 (two) times daily.    . benzonatate (TESSALON) 100 MG capsule Take 100 mg  by mouth 3 (three) times daily as needed for cough.    . Blood Glucose Monitoring Suppl (ONE TOUCH ULTRA MINI) w/Device KIT 3 (three) times daily. for testing  0  . Calcium & Magnesium Carbonates (MYLANTA PO) Take 1 tablet by mouth daily as needed. For indigestion.    . Cholecalciferol (VITAMIN D) 2000 UNITS tablet Take 2,000 Units by mouth daily.     . clopidogrel (PLAVIX) 75 MG tablet Take 1 tablet (75 mg total) by mouth every morning. 90 tablet 3  . Cyanocobalamin (VITAMIN B-12 IJ) Inject as directed every 30 (thirty) days.    . cyclobenzaprine (FLEXERIL) 5 MG tablet Take 1 tablet (5 mg total) by mouth at bedtime. 30 tablet 3  . diltiazem (CARDIZEM CD) 240 MG 24 hr capsule Take 1 capsule (240 mg total) by mouth daily. 30 capsule 10  . doxazosin (CARDURA) 8 MG tablet Take 1 tablet (8 mg total) by mouth daily. 30 tablet 6  . fexofenadine (ALLEGRA) 180 MG tablet Take 1 tablet (180 mg total) by mouth daily. 30 tablet 6  . furosemide (LASIX) 40 MG tablet Take 40 mg by mouth every other day.     . insulin lispro protamine-insulin lispro (HUMALOG 75/25) (75-25) 100 UNIT/ML SUSP 30 in am, 45 in evening    . isosorbide mononitrate (IMDUR) 60 MG 24 hr tablet Take 1 tablet (60  mg total) by mouth daily. 30 tablet 11  . loperamide (IMODIUM) 2 MG capsule Take 2 mg by mouth as needed.     . meclizine (ANTIVERT) 25 MG tablet Take 25 mg by mouth as needed for dizziness (dizziness).     . metoprolol succinate (TOPROL-XL) 50 MG 24 hr tablet Take 1 tablet by mouth daily.    . montelukast (SINGULAIR) 5 MG chewable tablet Chew 1 tablet (5 mg total) by mouth at bedtime. 30 tablet 3  . Multiple Vitamin (MULTIVITAMIN WITH MINERALS) TABS Take 1 tablet by mouth daily.    . Nebulizers (COMPRESSOR/NEBULIZER) MISC Use with albuterol 1 each 0  . nitroGLYCERIN (NITROSTAT) 0.4 MG SL tablet Place 0.4 mg under the tongue every 5 (five) minutes x 3 doses as needed. For chest pain.    . ONE TOUCH ULTRA TEST test strip CHECK BLOOD  SUGAR TWICE A DAY DX: E11.9  3  . ONETOUCH DELICA LANCETS 00L MISC 3 (three) times daily. for testing  0  . Pancrelipase, Lip-Prot-Amyl, 24000 units CPEP Take 2 capsules (48,000 Units total) by mouth 3 (three) times daily with meals. Take 1 capsule with a snack 210 capsule 6  . pantoprazole (PROTONIX) 40 MG tablet Take 1 tablet (40 mg total) by mouth 2 (two) times daily before a meal. 60 tablet 11  . potassium chloride SA (K-DUR,KLOR-CON) 20 MEQ tablet Take 20 mEq by mouth 2 (two) times daily.    Marland Kitchen albuterol (PROVENTIL HFA;VENTOLIN HFA) 108 (90 BASE) MCG/ACT inhaler Inhale 2 puffs into the lungs every 6 (six) hours as needed. For shortness of breath. (Patient not taking: Reported on 02/13/2016) 1 Inhaler 0  . albuterol (PROVENTIL) (2.5 MG/3ML) 0.083% nebulizer solution Take 3 mLs (2.5 mg total) by nebulization every 6 (six) hours as needed. For shortness of breath. (Patient not taking: Reported on 02/13/2016) 360 mL 11  . budesonide (PULMICORT) 0.5 MG/2ML nebulizer solution Take 2 mLs (0.5 mg total) by nebulization 2 (two) times daily. (Patient not taking: Reported on 02/13/2016) 120 mL 11  . formoterol (PERFOROMIST) 20 MCG/2ML nebulizer solution Take 2 mLs (20 mcg total) by nebulization 2 (two) times daily. (Patient not taking: Reported on 02/13/2016) 120 mL 11   Current Facility-Administered Medications on File Prior to Visit  Medication Dose Route Frequency Provider Last Rate Last Dose  . cyanocobalamin ((VITAMIN B-12)) injection 1,000 mcg  1,000 mcg Intramuscular Q30 days Binnie Rail, MD   1,000 mcg at 07/21/15 1530   Past Medical History:  Diagnosis Date  . Acute myocardial infarction, unspecified site, episode of care unspecified   . Adenomatous colon polyp   . Anxiety   . Chest pain   . Coronary artery disease   . Diabetes mellitus   . GERD (gastroesophageal reflux disease)   . Hiatal hernia   . Hyperlipidemia   . Hypertension   . PONV (postoperative nausea and vomiting)   . Schatzki's  ring   . Shoulder injury    resolved after shoulder surgery  . Unspecified asthma(493.90)   . Upper respiratory symptom    02-13-14 "runny nose, mild cough,sniffles" -instructed to inform MD if symptoms worsens   Past Surgical History:  Procedure Laterality Date  . ABDOMINAL HYSTERECTOMY    . APPENDECTOMY     came out with Hysterectomy  . CARDIAC CATHETERIZATION  07/23/2009   EF 60%  . CARDIAC CATHETERIZATION  10/11/2008  . CARDIAC CATHETERIZATION  03/01/2007   EF 75-80%  . CARDIOVASCULAR STRESS TEST  11/15/2007  EF 60%  . COLONOSCOPY    . CORONARY ARTERY BYPASS GRAFT     SEVERELY DISEASED SAPHENOUS VEIN GRAFT TO THE RIGHT CORONARY ARTERY BUT WITH FAIRLY WELL PRESERVED FLOW TO THE DISTAL RIGHT CORONARY ARTERY FROM THE NATIVE CIRCULATION-RESTART  CATH IN JUNE 2000, REVEALS MILD/MODERATE  CAD WITH GOOD FLOW DOWN HER LAD  . ESOPHAGEAL DILATION    . ESOPHAGOGASTRODUODENOSCOPY    . EYE SURGERY     bilateral cataract surgery with lens implant  . lense removal Left   . ROTATOR CUFF REPAIR     right and left  . TONSILLECTOMY     age 15  . 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 tumor removed kidney    . US ECHOCARDIOGRAPHY  03/08/2008   EF 55-60%   Family History  Problem Relation Age of Onset  . Heart disease Maternal Grandfather   . Heart failure Maternal Grandfather   . Diabetes Maternal Grandfather   . Heart attack Father   . Stomach cancer Father   . Diabetes Mother   . Rheum arthritis Sister   . Emphysema Paternal Uncle   . Esophageal cancer Brother 45    she said he was born with it  . Emphysema Paternal Aunt   . Neuropathy Neg Hx   . Multiple sclerosis Neg Hx   . Colon cancer Neg Hx    Social History   Social History  . Marital status: Widowed    Spouse name: N/A  . Number of children: 4  . Years of education: Doctorate    Occupational History  . Retired    Social History Main Topics  . Smoking status: Former Smoker    Packs/day: 0.50    Years: 21.00    Types: Cigarettes    Start date: 02/09/1955    Quit date: 02/09/1976  . Smokeless tobacco: Never Used  . Alcohol use No  . Drug use: No  . Sexual activity: Not Asked   Other Topics Concern  . None   Social History Narrative   Lives alone.   Caffeine use: Drinks 1 cup coffee/day      Originally from [place]. Previously has lived in [place]. Prior travel to [place], [place], [place], [place], [place], [place], [place], & [place]. Previously worked in [employer] She has a dog currently. No bird, mold, or hot tub exposure. She also pastors a church.       Objective:   Physical Exam BP 138/82 (BP Location: Left Arm, Cuff Size: Normal)   Pulse 84   Ht _0  (1.6 m)   Wt 204 lb 9.6 oz (92.8 kg)   SpO2 100%   BMI 36.24 kg/m  General:  Obese female. No distress. Comfortable. Integument:  Warm & dry. No bruising exposed skin. HEENT:  Moist mucus membranes. Persistent nasal turbinate swelling unchanged.  Cardiovascular:  Regular rate & rhythm. Normal S1 & S2.  Pulmonary: Clear with breathing bilaterally with auscultation. Normal work of breathing. Speaking in complete sentences. Abdomen: Soft. Normal bowel sounds. Protuberant. Musculoskeletal:  Normal bulk and tone. No joint deformity or effusion appreciated.  PFT 06/04/15: FVC 1.57 L (77%) FEV1 1.01 L (64%) FEV1/FVC 0.65 FEF 25-75 0.54 L (39%) negative bronchodilator response 02/21/15: FVC 1.60 L (78%) FEV1 1.03 L (65%) FEV1/FVC 0.64 FEF 25-75 0.51 L (37%) positive bronchodilator response 11/20/14: FVC 1.44 L (70%) FEV1  0.91 L (57%) FEV1/FVC 0.63 FEF 25-75 0.47 L (33%) positive bronchodilator response TLC 3.92 L (79%) RV 103% DLCO uncorrected 64% 11/12/13: FVC 1.57 L (74%) FEV1 1.09 L (67%) FEV1/FVC 0.70 FEF 25-75 0.72 L (50%) negative bronchodilator response 08/04/11: FVC 1.61 L (60%) FEV1 0.89 L (48%)  FEV1/FVC 0.56 FEF 25-75 0.29 L (14%) positive bronchodilator response TLC 3.67 L (80%) RV 103% ERV 46% DLCO corrected 38%  6MWT 11/20/14:  Walked 336 meters / Baseline Sat 100% on RA / Nadir Sat 99% on RA (pt c/o pain in her right neck w/ dyspnea)  IMAGING CXR PA/LAT 03/13/15 (previously reviewed by me): No new nodule or opacity appreciated. No pleural effusion. Mild hyperinflation with flattening of the diaphragms. Heart normal in size. Mediastinum normal in contour.  BARIUM SWALLOW (10/15/14): Schatzki ring distal esophagus maximum diameter 1.2 cm with barium tablet impacting at this ring.  CXR PA/LAT 02/13/14 (previously reviewed by me):  Sternotomy wires from prior thoracic surgery noted. Heart normal in size. No pleural effusion appreciated. No parenchymal nodule or opacification appreciated. Mild flattening of the diaphragms bilaterally.  CARDIAC TTE (10/06/11): LV normal in size. Normal regional wall motion. EF 60-65%. Grade 1 diastolic dysfunction. LA & RA normal in size. 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 RV normal in size and function. RVSP 19 mmHg. No aortic stenosis or regurgitation. No mitral stenosis or regurgitation. No pulmonic stenosis. Trivial tricuspid regurgitation. No pericardial effusion.  LABS 03/13/15 Procalcitonin:  <0.1 CBC: 10.9/11.8/37.3/245  10/08/14 IgG: 1060 IgM: 112 IgA: 272 IgE: 5 CBC: 6.1/11.3/36.0/293 Differential: Eos 0.3 (4.5%)  RAST panel: Negative Aspergillus antigen: <0.1  1/26/9 ABG:  7.39/44/113    Assessment & Plan:  76 y.o. female with severe, persistent asthma with fixed airways obstruction, GERD, chronic allergic rhinitis, & OSA. Patient is continuing to have difficulty affording her inhaler medications with her other medication costs. She may benefit from a referral to our accountable care organization which will be placed today. She is continuing to have significant symptoms from her underlying asthma. Her reflux seems to be  suboptimally controlled but is currently being managed by GI. She has no signs of exacerbation today. I am continuing her current oral medication regimen as is. I instructed the patient contact my office if she had any new breathing problems or questions before next appointment.  1. Severe, Persistent Asthma w/ Fixed Airway Obstruction:  Patient given samples of Perforomist and Pulmicort inhalter today. 2. GERD: Continuing Protonix & Pepcid & following with GI. No changes today. 3. Chronic Allergic Rhinitis: Continuing Singulair & Allegra as prescribed.  4. OSA: Management per Neurology. 5. Health Maintenance: S/P Influenza October 2017, Pneumovax January 2006 & Prevnar 21 August 2015. 6. Follow-up: Return to clinic in 6 months or sooner if needed.   Sonia Baller Ashok Cordia, M.D. [facility] Pager:  [phone] After 3pm or if no response, call [phone] 12:05 PM 02/13/16

## 2016-02-18 ENCOUNTER — Other Ambulatory Visit: Payer: Self-pay | Admitting: Neurology

## 2016-02-19 ENCOUNTER — Telehealth: Payer: Self-pay

## 2016-02-19 NOTE — Telephone Encounter (Signed)
I called patient to see if she is still using CPAP. She said that she is not and [facility] took it back for non compliance.

## 2016-02-23 ENCOUNTER — Ambulatory Visit: Payer: Medicare Other | Admitting: Neurology

## 2016-02-25 ENCOUNTER — Ambulatory Visit: Payer: Self-pay | Admitting: Neurology

## 2016-02-27 ENCOUNTER — Telehealth: Payer: Self-pay | Admitting: *Deleted

## 2016-02-27 NOTE — Telephone Encounter (Signed)
Called patient back and scheduled appt for 03/02/16 at 430pm, check in 4pm. Pt verbalized understanding.

## 2016-02-27 NOTE — Telephone Encounter (Signed)
LVM for pt to call back and r/s appt from 02/25/16 due to the weather. Gave [facility] phone number.

## 2016-03-02 ENCOUNTER — Telehealth: Payer: Self-pay | Admitting: *Deleted

## 2016-03-02 ENCOUNTER — Ambulatory Visit: Payer: Self-pay | Admitting: Neurology

## 2016-03-02 NOTE — Telephone Encounter (Signed)
No showed f/u appt.  

## 2016-03-03 ENCOUNTER — Telehealth: Payer: Self-pay | Admitting: *Deleted

## 2016-03-03 ENCOUNTER — Encounter: Payer: Self-pay | Admitting: Neurology

## 2016-03-03 NOTE — Telephone Encounter (Signed)
Called and LVM for pt to call back and schedule f/u with MM,NP tomorrow if she is agreeable to this to be evaluated since she missed her appt yesterday and there was a mix up.  Advised Dr Jaynee Eagles out the rest of the week. Gave [facility] phone number.

## 2016-03-03 NOTE — Telephone Encounter (Signed)
Tried calling pt back. VM full on home number, unable to LVM

## 2016-03-04 ENCOUNTER — Ambulatory Visit (INDEPENDENT_AMBULATORY_CARE_PROVIDER_SITE_OTHER): Payer: Medicare Other | Admitting: Adult Health

## 2016-03-04 ENCOUNTER — Encounter: Payer: Self-pay | Admitting: Adult Health

## 2016-03-04 VITALS — BP 152/84 | HR 93 | Ht 63.0 in | Wt 206.4 lb

## 2016-03-04 DIAGNOSIS — M542 Cervicalgia: Secondary | ICD-10-CM | POA: Diagnosis not present

## 2016-03-04 DIAGNOSIS — R51 Headache: Secondary | ICD-10-CM

## 2016-03-04 DIAGNOSIS — G4733 Obstructive sleep apnea (adult) (pediatric): Secondary | ICD-10-CM | POA: Diagnosis not present

## 2016-03-04 DIAGNOSIS — G8929 Other chronic pain: Secondary | ICD-10-CM

## 2016-03-04 NOTE — Telephone Encounter (Signed)
[place]. Patient is going to come see you today for f/u since she missed appt with Dr Jaynee Eagles the other day

## 2016-03-04 NOTE — Progress Notes (Addendum)
PATIENT: [name] DOB: May 24, 1940  REASON FOR VISIT: follow up- [facility] on cpap, neck pain, headache HISTORY FROM: patient  HISTORY OF PRESENT ILLNESS: [name] is a 76 year old female with a history of obstructive sleep apnea on CPAP and neck pain and headaches. She returns today for an evaluation. The patient reports that she is no longer on CPAP. She states that her insurance company would no longer let her keep her machine as she was not using the machine long enough. She states that she continues to have pain in the neck. She states that normally starts on the right side and then travels to the whole head causing her to have a headache. She reports that she has a headache every day. She reports every day her pain is a 10 out of 10 on the pain scale. For that reason she states that she does not go out of her home much. She states with her headaches she does not have photophobia and phonophobia. She does have mild nausea but no vomiting. She also reports blurry vision with her headaches typically in the morning. She is currently on baclofen for muscle spasms she was given Flexeril in the past to use for neck pain and headaches. However she states that she is having using this consistently. She returns today for an evaluation.  HISTORY  11/18/2015: [name] is a 76 year old female with a history of obstructive sleep apnea on CPAP. She returns today for a compliance download. Her download indicates that she uses her machine 24 out of 30 days for compliance of 80%. On average she uses her machine greater than 4 hours 12 out of 30 days for compliance of 40%. Her average usage is 3 hours and 52 minutes. Her residual AHI is 10.2 on 9 cm of water with EPR of 3. She has a leak in the 95th percentile at 23.2 L/m. The patient is adamant that she puts machine on when she goes to sleep and does not remove it to the next morning. She states that she typically goes to bed around 11 and wakes up at 5.  However according to the download there are some nights she uses the machine greater than 4 hours but there are a few that she does not. She does state that she is a mouth breather. Unsure if she needs a full face mask instead of the nasal pillows or dreamwear. She doesn't feel that she is taking it off during the night as is always on in the morning. She does state there are some nights that she only gets 4 hours or less of sleep. Her fatigue severity score is 22 and Epworth sleepiness score is 1. She returns today for an evaluation.   REVIEW OF SYSTEMS: Out of a complete 14 system review of symptoms, the patient complains only of the following symptoms, and all other reviewed systems are negative.  Blurred vision, wheezing, runny nose, incontinence of bowels, frequent waking, walking difficulty, neck pain, neck stiffness, weakness, headache, dizziness  ALLERGIES: Allergies  Allergen Reactions  . Amlodipine   . Banana Nausea And Vomiting    Stomach pumped  . Co Q10 [Coenzyme Q10]     Body cramps  . Codeine     Hallucinate, loose identity and don't know who I am  . Morphine Other (See Comments)    Can not function, it immobilizes me   . Statins     Muscle cramps  . Sulfonamide Derivatives Swelling  HOME MEDICATIONS: Outpatient Medications Prior to Visit  Medication Sig Dispense Refill  . albuterol (PROVENTIL HFA;VENTOLIN HFA) 108 (90 BASE) MCG/ACT inhaler Inhale 2 puffs into the lungs every 6 (six) hours as needed. For shortness of breath. 1 Inhaler 0  . albuterol (PROVENTIL) (2.5 MG/3ML) 0.083% nebulizer solution Take 3 mLs (2.5 mg total) by nebulization every 6 (six) hours as needed. For shortness of breath. 360 mL 11  . ALPRAZolam (XANAX) 0.5 MG tablet Take 0.5 mg by mouth 2 (two) times daily as needed.    . baclofen (LIORESAL) 20 MG tablet Take 1 tablet by mouth 2 (two) times daily.    . benzonatate (TESSALON) 100 MG capsule Take 100 mg by mouth 3 (three) times daily as needed  for cough.    . Blood Glucose Monitoring Suppl (ONE TOUCH ULTRA MINI) w/Device KIT 3 (three) times daily. for testing  0  . Calcium & Magnesium Carbonates (MYLANTA PO) Take 1 tablet by mouth daily as needed. For indigestion.    . Cholecalciferol (VITAMIN D) 2000 UNITS tablet Take 2,000 Units by mouth daily.     . clopidogrel (PLAVIX) 75 MG tablet Take 1 tablet (75 mg total) by mouth every morning. 90 tablet 3  . Cyanocobalamin (VITAMIN B-12 IJ) Inject as directed every 30 (thirty) days.    . cyclobenzaprine (FLEXERIL) 5 MG tablet TAKE 1 TABLET BY MOUTH AT BEDTIME 30 tablet 3  . diltiazem (CARDIZEM CD) 240 MG 24 hr capsule Take 1 capsule (240 mg total) by mouth daily. 30 capsule 10  . doxazosin (CARDURA) 8 MG tablet Take 1 tablet (8 mg total) by mouth daily. 30 tablet 6  . fexofenadine (ALLEGRA) 180 MG tablet Take 1 tablet (180 mg total) by mouth daily. (Patient taking differently: Take 180 mg by mouth daily as needed. ) 30 tablet 6  . formoterol (PERFOROMIST) 20 MCG/2ML nebulizer solution Take 2 mLs (20 mcg total) by nebulization 2 (two) times daily. 120 mL 11  . formoterol (PERFOROMIST) 20 MCG/2ML nebulizer solution Take 2 mLs (20 mcg total) by nebulization 2 (two) times daily. 60 mL 0  . furosemide (LASIX) 40 MG tablet Take 40 mg by mouth every other day.     . insulin lispro protamine-insulin lispro (HUMALOG 75/25) (75-25) 100 UNIT/ML SUSP 30 in am, 45 in evening    . isosorbide mononitrate (IMDUR) 60 MG 24 hr tablet Take 1 tablet (60 mg total) by mouth daily. 30 tablet 11  . loperamide (IMODIUM) 2 MG capsule Take 2 mg by mouth as needed.     . meclizine (ANTIVERT) 25 MG tablet Take 25 mg by mouth as needed for dizziness (dizziness).     . metoprolol succinate (TOPROL-XL) 50 MG 24 hr tablet Take 1 tablet by mouth daily.    . montelukast (SINGULAIR) 5 MG chewable tablet Chew 1 tablet (5 mg total) by mouth at bedtime. 30 tablet 3  . Multiple Vitamin (MULTIVITAMIN WITH MINERALS) TABS Take 1 tablet  by mouth daily.    . Nebulizers (COMPRESSOR/NEBULIZER) MISC Use with albuterol 1 each 0  . nitroGLYCERIN (NITROSTAT) 0.4 MG SL tablet Place 0.4 mg under the tongue every 5 (five) minutes x 3 doses as needed. For chest pain.    . ONE TOUCH ULTRA TEST test strip CHECK BLOOD SUGAR TWICE A DAY DX: E11.9  3  . ONETOUCH DELICA LANCETS 07P MISC 3 (three) times daily. for testing  0  . Pancrelipase, Lip-Prot-Amyl, 24000 units CPEP Take 2 capsules (48,000 Units total) by mouth  3 (three) times daily with meals. Take 1 capsule with a snack 210 capsule 6  . pantoprazole (PROTONIX) 40 MG tablet Take 1 tablet (40 mg total) by mouth 2 (two) times daily before a meal. 60 tablet 11  . potassium chloride SA (K-DUR,KLOR-CON) 20 MEQ tablet Take 20 mEq by mouth 2 (two) times daily.    . Budesonide (PULMICORT FLEXHALER) 90 MCG/ACT inhaler Inhale 2 puffs into the lungs 2 (two) times daily. (Patient not taking: Reported on 03/04/2016) 2 each 0  . budesonide (PULMICORT) 0.5 MG/2ML nebulizer solution Take 2 mLs (0.5 mg total) by nebulization 2 (two) times daily. (Patient not taking: Reported on 02/13/2016) 120 mL 11   Facility-Administered Medications Prior to Visit  Medication Dose Route Frequency Provider Last Rate Last Dose  . cyanocobalamin ((VITAMIN B-12)) injection 1,000 mcg  1,000 mcg Intramuscular Q30 days Binnie Rail, MD   1,000 mcg at 07/21/15 1530    PAST MEDICAL HISTORY: Past Medical History:  Diagnosis Date  . Acute myocardial infarction, unspecified site, episode of care unspecified   . Adenomatous colon polyp   . Anxiety   . Chest pain   . Coronary artery disease   . Diabetes mellitus   . GERD (gastroesophageal reflux disease)   . Hiatal hernia   . Hyperlipidemia   . Hypertension   . PONV (postoperative nausea and vomiting)   . Schatzki's ring   . Shoulder injury    resolved after shoulder surgery  . Unspecified asthma(493.90)   . Upper respiratory symptom    02-13-14 "runny nose, mild  cough,sniffles" -instructed to inform MD if symptoms worsens    PAST SURGICAL HISTORY: Past Surgical History:  Procedure Laterality Date  . ABDOMINAL HYSTERECTOMY    . APPENDECTOMY     came out with Hysterectomy  . CARDIAC CATHETERIZATION  07/23/2009   EF 60%  . CARDIAC CATHETERIZATION  10/11/2008  . CARDIAC CATHETERIZATION  03/01/2007   EF 75-80%  . CARDIOVASCULAR STRESS TEST  11/15/2007   EF 60%  . COLONOSCOPY    . CORONARY ARTERY BYPASS GRAFT     SEVERELY DISEASED SAPHENOUS VEIN GRAFT TO THE RIGHT CORONARY ARTERY BUT WITH FAIRLY WELL PRESERVED FLOW TO THE DISTAL RIGHT CORONARY ARTERY FROM THE NATIVE CIRCULATION-RESTART  CATH IN JUNE 2000, REVEALS MILD/MODERATE  CAD WITH GOOD FLOW DOWN HER LAD  . ESOPHAGEAL DILATION    . ESOPHAGOGASTRODUODENOSCOPY    . EYE SURGERY     bilateral cataract surgery with lens implant  . lense removal Left   . ROTATOR CUFF REPAIR     right and left  . TONSILLECTOMY     age 20  . 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 tumor removed kidney    . US ECHOCARDIOGRAPHY  03/08/2008   EF 55-60%    FAMILY HISTORY: Family History  Problem Relation Age of Onset  . Heart disease Maternal Grandfather   . Heart failure Maternal Grandfather   . Diabetes Maternal Grandfather   . Heart attack Father   . Stomach cancer Father   . Diabetes Mother   . Rheum arthritis Sister   . Emphysema Paternal Uncle   . Esophageal cancer Brother 26    she said he was born with it  . Emphysema Paternal Aunt   . Neuropathy Neg Hx   . Multiple sclerosis Neg Hx   . Colon  cancer Neg Hx     SOCIAL HISTORY: Social History   Social History  . Marital status: Widowed    Spouse name: N/A  . Number of children: 4  . Years of education: Doctorate   Occupational History  . Retired    Social History Main Topics  . Smoking status:  Former Smoker    Packs/day: 0.50    Years: 21.00    Types: Cigarettes    Start date: 02/09/1955    Quit date: 02/09/1976  . Smokeless tobacco: Never Used  . Alcohol use No  . Drug use: No  . Sexual activity: Not on file   Other Topics Concern  . Not on file   Social History Narrative   Lives alone.   Caffeine use: Drinks 1 cup coffee/day      Originally from [place]. Previously has lived in [place]. Prior travel to [place], [place], [place], [place], [place], [place], [place], & [place]. Previously worked in [employer] She has a dog currently. No bird, mold, or hot tub exposure. She also pastors a church.       PHYSICAL EXAM  Vitals:   03/04/16 1332  BP: (!) 152/84  Pulse: 93  Weight: 206 lb 6.4 oz (93.6 kg)  Height: '5\' 3"'  (1.6 m)   Body mass index is 36.56 kg/m.  Generalized: Well developed, in no acute distress   Neurological examination  Mentation: Alert oriented to time, place, history taking. Follows all commands speech and language fluent Cranial nerve II-XII: Pupils were equal round reactive to light. Extraocular movements were full, visual field were full on confrontational test. Facial sensation and strength were normal. Uvula tongue midline. Head turning and shoulder shrug  were normal and symmetric. Motor: The motor testing reveals 5 over 5 strength of all 4 extremities. Good symmetric motor tone is noted throughout.  Sensory: Sensory testing is intact to soft touch on all 4 extremities. No evidence of extinction is noted.  Coordination: Cerebellar testing reveals good finger-nose-finger and heel-to-shin bilaterally.  Gait and station: Gait is normal. Reflexes: Deep tendon reflexes are symmetric and normal bilaterally.   DIAGNOSTIC DATA (LABS, IMAGING, TESTING) - I reviewed patient records, labs, notes, testing and imaging myself where available.  Lab Results  Component Value Date   WBC 6.2 03/31/2015   HGB 12.2 03/31/2015   HCT 37.3 03/31/2015   MCV 79.0 03/31/2015    PLT 284.0 03/31/2015      Component Value Date/Time   NA 142 03/31/2015 1648   K 4.2 03/31/2015 1648   CL 106 03/31/2015 1648   CO2 31 03/31/2015 1648   GLUCOSE 119 (H) 03/31/2015 1648   BUN 18 03/31/2015 1648   CREATININE 0.82 03/31/2015 1648   CALCIUM 9.3 03/31/2015 1648   PROT 7.3 03/31/2015 1648   ALBUMIN 4.2 03/31/2015 1648   AST 15 03/31/2015 1648   ALT 13 03/31/2015 1648   ALKPHOS 91 03/31/2015 1648   BILITOT 0.3 03/31/2015 1648   GFRNONAA 69 (L) 02/22/2014 0416   GFRAA 80 (L) 02/22/2014 0416    Lab Results  Component Value Date   HGBA1C 7.7 (H) 03/31/2015   Lab Results  Component Value Date   VITAMINB12 253 03/31/2015   Lab Results  Component Value Date   TSH 0.58 03/31/2015      ASSESSMENT AND PLAN 76 y.o. year old female  has a past medical history of Acute myocardial infarction, unspecified site, episode of care unspecified; Adenomatous colon polyp; Anxiety; Chest pain; Coronary artery disease;  Diabetes mellitus; GERD (gastroesophageal reflux disease); Hiatal hernia; Hyperlipidemia; Hypertension; PONV (postoperative nausea and vomiting); Schatzki's ring; Shoulder injury; Unspecified asthma(493.90); and Upper respiratory symptom. here with:  1. Neck pain 2. Headache 3. Obstructive sleep apnea on CPAP untreated  The patient is advised that her headaches could be coming from her neck. Previous MRI indicated that she has multilevel degenerative changes. She has followed with Dr. Nelva Bush but he was unable to offer her any interventions as she is on Plavix. Patient is advised to take Flexeril 5 mg daily at bedtime. She should decrease her dose of baclofen to 1 tablet in the morning only. In the future we may completely wean off baclofen. Hopefully Flexeril will offer her some relief for her neck pain and headaches. She was advised that she should not take Flexeril and baclofen together. Patient also advised that untreated sleep apnea could also be a factor in her  ongoing headache. The patient is adamant that she was using her CPAP longer than 4 hours each night however her download in October did not reflect that. I did discuss this with the patient. Not sure that her insurance will let her re-try CPAP therapy at this time. She will follow-up in 6 months with Dr. Jaynee Eagles.  Ward Givens, MSN, NP-C 03/04/2016, 2:04 PM [facility] [address] [place], [place] [address] [phone]  I reviewed the above note and documentation by the Nurse Practitioner and agree with the history, physical exam, assessment and plan as outlined above. I was immediately available for face-to-face consultation. Star Age, MD, PhD [facility])

## 2016-03-04 NOTE — Patient Instructions (Signed)
We will call DME company about CPAP Decrease Baclofen to 1 tablet in the morning.  Start taking flexeril 5 mg at bedtime If your symptoms worsen or you develop new symptoms please let us know.

## 2016-03-04 NOTE — Telephone Encounter (Signed)
Pt called back, said she will see NP today. FYI

## 2016-03-04 NOTE — Telephone Encounter (Signed)
Called and LVM for pt again to call and discuss headache/neck pain.  Can schedule f/u with NP if pt requests to still come in. Dr Jaynee Eagles out of office until Monday.

## 2016-03-18 ENCOUNTER — Emergency Department (HOSPITAL_COMMUNITY): Payer: Medicare Other

## 2016-03-18 ENCOUNTER — Observation Stay (HOSPITAL_COMMUNITY)
Admission: EM | Admit: 2016-03-18 | Discharge: 2016-03-20 | Disposition: A | Discharge status: Home / Self Care | Payer: Medicare Other | Attending: Internal Medicine | Admitting: Internal Medicine

## 2016-03-18 ENCOUNTER — Telehealth: Payer: Self-pay | Admitting: Gastroenterology

## 2016-03-18 ENCOUNTER — Encounter (HOSPITAL_COMMUNITY): Payer: Self-pay

## 2016-03-18 DIAGNOSIS — K5900 Constipation, unspecified: Secondary | ICD-10-CM | POA: Diagnosis not present

## 2016-03-18 DIAGNOSIS — I252 Old myocardial infarction: Secondary | ICD-10-CM | POA: Diagnosis not present

## 2016-03-18 DIAGNOSIS — D62 Acute posthemorrhagic anemia: Secondary | ICD-10-CM | POA: Insufficient documentation

## 2016-03-18 DIAGNOSIS — Z7902 Long term (current) use of antithrombotics/antiplatelets: Secondary | ICD-10-CM | POA: Insufficient documentation

## 2016-03-18 DIAGNOSIS — J4550 Severe persistent asthma, uncomplicated: Secondary | ICD-10-CM | POA: Insufficient documentation

## 2016-03-18 DIAGNOSIS — Z794 Long term (current) use of insulin: Secondary | ICD-10-CM | POA: Insufficient documentation

## 2016-03-18 DIAGNOSIS — F419 Anxiety disorder, unspecified: Secondary | ICD-10-CM | POA: Diagnosis not present

## 2016-03-18 DIAGNOSIS — I110 Hypertensive heart disease with heart failure: Secondary | ICD-10-CM | POA: Diagnosis not present

## 2016-03-18 DIAGNOSIS — R1031 Right lower quadrant pain: Secondary | ICD-10-CM | POA: Insufficient documentation

## 2016-03-18 DIAGNOSIS — Z87891 Personal history of nicotine dependence: Secondary | ICD-10-CM | POA: Diagnosis not present

## 2016-03-18 DIAGNOSIS — I10 Essential (primary) hypertension: Secondary | ICD-10-CM | POA: Diagnosis not present

## 2016-03-18 DIAGNOSIS — Z951 Presence of aortocoronary bypass graft: Secondary | ICD-10-CM | POA: Diagnosis not present

## 2016-03-18 DIAGNOSIS — K922 Gastrointestinal hemorrhage, unspecified: Secondary | ICD-10-CM | POA: Diagnosis not present

## 2016-03-18 DIAGNOSIS — I5032 Chronic diastolic (congestive) heart failure: Secondary | ICD-10-CM | POA: Insufficient documentation

## 2016-03-18 DIAGNOSIS — E669 Obesity, unspecified: Secondary | ICD-10-CM | POA: Insufficient documentation

## 2016-03-18 DIAGNOSIS — G4733 Obstructive sleep apnea (adult) (pediatric): Secondary | ICD-10-CM | POA: Insufficient documentation

## 2016-03-18 DIAGNOSIS — R0989 Other specified symptoms and signs involving the circulatory and respiratory systems: Secondary | ICD-10-CM | POA: Diagnosis present

## 2016-03-18 DIAGNOSIS — Z6837 Body mass index (BMI) 37.0-37.9, adult: Secondary | ICD-10-CM | POA: Insufficient documentation

## 2016-03-18 DIAGNOSIS — K625 Hemorrhage of anus and rectum: Secondary | ICD-10-CM | POA: Diagnosis present

## 2016-03-18 DIAGNOSIS — Z96651 Presence of right artificial knee joint: Secondary | ICD-10-CM | POA: Diagnosis not present

## 2016-03-18 DIAGNOSIS — Z79899 Other long term (current) drug therapy: Secondary | ICD-10-CM | POA: Insufficient documentation

## 2016-03-18 DIAGNOSIS — E109 Type 1 diabetes mellitus without complications: Secondary | ICD-10-CM | POA: Diagnosis not present

## 2016-03-18 DIAGNOSIS — I2510 Atherosclerotic heart disease of native coronary artery without angina pectoris: Secondary | ICD-10-CM | POA: Insufficient documentation

## 2016-03-18 DIAGNOSIS — J449 Chronic obstructive pulmonary disease, unspecified: Secondary | ICD-10-CM | POA: Insufficient documentation

## 2016-03-18 DIAGNOSIS — K219 Gastro-esophageal reflux disease without esophagitis: Secondary | ICD-10-CM | POA: Insufficient documentation

## 2016-03-18 DIAGNOSIS — Z9049 Acquired absence of other specified parts of digestive tract: Secondary | ICD-10-CM | POA: Diagnosis not present

## 2016-03-18 LAB — COMPREHENSIVE METABOLIC PANEL
ALT: 13 U/L — AB (ref 14–54)
AST: 16 U/L (ref 15–41)
Albumin: 3.9 g/dL (ref 3.5–5.0)
Alkaline Phosphatase: 109 U/L (ref 38–126)
Anion gap: 6 (ref 5–15)
BUN: 17 mg/dL (ref 6–20)
CALCIUM: 8.7 mg/dL — AB (ref 8.9–10.3)
CHLORIDE: 105 mmol/L (ref 101–111)
CO2: 30 mmol/L (ref 22–32)
CREATININE: 0.65 mg/dL (ref 0.44–1.00)
GFR calc Af Amer: >60 mL/min (ref >60)
GFR calc non Af Amer: >60 mL/min (ref >60)
GLUCOSE: 169 mg/dL — AB (ref 65–99)
Potassium: 4.6 mmol/L (ref 3.5–5.1)
SODIUM: 141 mmol/L (ref 135–145)
Total Bilirubin: 0.1 mg/dL — ABNORMAL LOW (ref 0.3–1.2)
Total Protein: 6.7 g/dL (ref 6.5–8.1)

## 2016-03-18 LAB — CBC
HCT: 33.4 % — ABNORMAL LOW (ref 36.0–46.0)
HEMATOCRIT: 32.9 % — AB (ref 36.0–46.0)
HEMOGLOBIN: 10.7 g/dL — AB (ref 12.0–15.0)
Hemoglobin: 10.6 g/dL — ABNORMAL LOW (ref 12.0–15.0)
MCH: 25.8 pg — ABNORMAL LOW (ref 26.0–34.0)
MCH: 26.3 pg (ref 26.0–34.0)
MCHC: 31.7 g/dL (ref 30.0–36.0)
MCHC: 32.5 g/dL (ref 30.0–36.0)
MCV: 81.3 fL (ref 78.0–100.0)
MCV: 80.8 fL (ref 78.0–100.0)
PLATELETS: 240 10*3/uL (ref 150–400)
Platelets: 222 10*3/uL (ref 150–400)
RBC: 4.11 MIL/uL (ref 3.87–5.11)
RBC: 4.07 MIL/uL (ref 3.87–5.11)
RDW: 15.9 % — AB (ref 11.5–15.5)
RDW: 15.8 % — ABNORMAL HIGH (ref 11.5–15.5)
WBC: 5.4 10*3/uL (ref 4.0–10.5)
WBC: 5.4 10*3/uL (ref 4.0–10.5)

## 2016-03-18 LAB — URINALYSIS, ROUTINE W REFLEX MICROSCOPIC
Bilirubin Urine: NEGATIVE
GLUCOSE, UA: NEGATIVE mg/dL
Hgb urine dipstick: NEGATIVE
KETONES UR: NEGATIVE mg/dL
NITRITE: POSITIVE — AB
PROTEIN: NEGATIVE mg/dL
Specific Gravity, Urine: 1.015 (ref 1.005–1.030)
pH: 5 (ref 5.0–8.0)

## 2016-03-18 LAB — GLUCOSE, CAPILLARY
GLUCOSE-CAPILLARY: 68 mg/dL (ref 65–99)
GLUCOSE-CAPILLARY: 102 mg/dL — AB (ref 65–99)
GLUCOSE-CAPILLARY: 119 mg/dL — AB (ref 65–99)

## 2016-03-18 LAB — LIPASE, BLOOD: Lipase: 13 U/L (ref 11–51)

## 2016-03-18 LAB — TYPE AND SCREEN
ABO/RH(D): A POS
Antibody Screen: NEGATIVE

## 2016-03-18 LAB — PROTIME-INR
INR: 1.07
Prothrombin Time: 13.9 seconds (ref 11.4–15.2)

## 2016-03-18 LAB — POC OCCULT BLOOD, ED: Fecal Occult Bld: POSITIVE — AB

## 2016-03-18 LAB — APTT: aPTT: 32 seconds (ref 24–36)

## 2016-03-18 MED ORDER — ALBUTEROL SULFATE (2.5 MG/3ML) 0.083% IN NEBU: 2.5000 mg | INHALATION_SOLUTION | Freq: Four times a day (QID) | RESPIRATORY_TRACT | Status: DC | PRN

## 2016-03-18 MED ORDER — ISOSORBIDE MONONITRATE ER 60 MG PO TB24
60.0000 mg | ORAL_TABLET | Freq: Every day | ORAL | Status: DC
  Administered 2016-03-18 – 2016-03-20 (×2): 60 mg via ORAL
  Filled 2016-03-18 (×3): qty 1

## 2016-03-18 MED ORDER — IOPAMIDOL (ISOVUE-300) INJECTION 61%
100.0000 mL | Freq: Once | INTRAVENOUS | Status: AC | PRN
  Administered 2016-03-18: 100 mL via INTRAVENOUS

## 2016-03-18 MED ORDER — INSULIN ASPART 100 UNIT/ML ~~LOC~~ SOLN: 0.0000 [IU] | Freq: Every day | SUBCUTANEOUS | Status: DC

## 2016-03-18 MED ORDER — PANCRELIPASE (LIP-PROT-AMYL) 12000-38000 UNITS PO CPEP
12000.0000 [IU] | ORAL_CAPSULE | Freq: Three times a day (TID) | ORAL | Status: DC
  Administered 2016-03-18 – 2016-03-20 (×4): 12000 [IU] via ORAL
  Filled 2016-03-18 (×4): qty 1

## 2016-03-18 MED ORDER — INSULIN ASPART 100 UNIT/ML ~~LOC~~ SOLN
0.0000 [IU] | Freq: Three times a day (TID) | SUBCUTANEOUS | Status: DC
  Administered 2016-03-19: 2 [IU] via SUBCUTANEOUS
  Administered 2016-03-19 (×2): 1 [IU] via SUBCUTANEOUS
  Administered 2016-03-20: 3 [IU] via SUBCUTANEOUS
  Administered 2016-03-20: 1 [IU] via SUBCUTANEOUS

## 2016-03-18 MED ORDER — BUDESONIDE 90 MCG/ACT IN AEPB: INHALATION_SPRAY | Freq: Two times a day (BID) | RESPIRATORY_TRACT | Status: DC

## 2016-03-18 MED ORDER — PANTOPRAZOLE SODIUM 40 MG IV SOLR
40.0000 mg | Freq: Two times a day (BID) | INTRAVENOUS | Status: DC
  Administered 2016-03-18 – 2016-03-20 (×4): 40 mg via INTRAVENOUS
  Filled 2016-03-18 (×4): qty 40

## 2016-03-18 MED ORDER — BACLOFEN 10 MG PO TABS
20.0000 mg | ORAL_TABLET | Freq: Every day | ORAL | Status: DC
  Administered 2016-03-18 – 2016-03-20 (×3): 20 mg via ORAL
  Filled 2016-03-18 (×4): qty 2

## 2016-03-18 MED ORDER — MONTELUKAST SODIUM 5 MG PO CHEW
5.0000 mg | CHEWABLE_TABLET | Freq: Every day | ORAL | Status: DC
  Administered 2016-03-18 – 2016-03-19 (×2): 5 mg via ORAL
  Filled 2016-03-18 (×4): qty 1

## 2016-03-18 MED ORDER — ARFORMOTEROL TARTRATE 15 MCG/2ML IN NEBU
15.0000 ug | INHALATION_SOLUTION | Freq: Two times a day (BID) | RESPIRATORY_TRACT | Status: DC
  Administered 2016-03-18 – 2016-03-20 (×3): 15 ug via RESPIRATORY_TRACT
  Filled 2016-03-18 (×4): qty 2

## 2016-03-18 MED ORDER — SODIUM CHLORIDE 0.9 % IJ SOLN
INTRAMUSCULAR | Status: AC
  Administered 2016-03-18: 16:00:00
  Filled 2016-03-18: qty 50

## 2016-03-18 MED ORDER — IOPAMIDOL (ISOVUE-300) INJECTION 61%
INTRAVENOUS | Status: AC
  Administered 2016-03-18: 16:00:00
  Filled 2016-03-18: qty 100

## 2016-03-18 MED ORDER — VITAMIN D 1000 UNITS PO TABS
2000.0000 [IU] | ORAL_TABLET | Freq: Every day | ORAL | Status: DC
  Administered 2016-03-19 – 2016-03-20 (×2): 2000 [IU] via ORAL
  Filled 2016-03-18 (×2): qty 2

## 2016-03-18 MED ORDER — BUDESONIDE 0.25 MG/2ML IN SUSP: 0.2500 mg | Freq: Two times a day (BID) | RESPIRATORY_TRACT | Status: DC

## 2016-03-18 MED ORDER — ALPRAZOLAM 0.5 MG PO TABS: 0.5000 mg | ORAL_TABLET | Freq: Two times a day (BID) | ORAL | Status: DC | PRN

## 2016-03-18 MED ORDER — DOXAZOSIN MESYLATE 8 MG PO TABS
8.0000 mg | ORAL_TABLET | Freq: Every day | ORAL | Status: DC
  Administered 2016-03-18 – 2016-03-20 (×2): 8 mg via ORAL
  Filled 2016-03-18 (×3): qty 1

## 2016-03-18 MED ORDER — FORMOTEROL FUMARATE 20 MCG/2ML IN NEBU: 20.0000 ug | INHALATION_SOLUTION | Freq: Two times a day (BID) | RESPIRATORY_TRACT | Status: DC

## 2016-03-18 MED ORDER — METOPROLOL SUCCINATE ER 50 MG PO TB24
50.0000 mg | ORAL_TABLET | Freq: Every day | ORAL | Status: DC
  Administered 2016-03-18 – 2016-03-20 (×3): 50 mg via ORAL
  Filled 2016-03-18 (×3): qty 1

## 2016-03-18 MED ORDER — BUDESONIDE 0.5 MG/2ML IN SUSP
0.5000 mg | Freq: Two times a day (BID) | RESPIRATORY_TRACT | Status: DC
  Administered 2016-03-18 – 2016-03-20 (×3): 0.5 mg via RESPIRATORY_TRACT
  Filled 2016-03-18 (×5): qty 2

## 2016-03-18 MED ORDER — DILTIAZEM HCL ER COATED BEADS 240 MG PO CP24
240.0000 mg | ORAL_CAPSULE | Freq: Every day | ORAL | Status: DC
  Administered 2016-03-18 – 2016-03-20 (×2): 240 mg via ORAL
  Filled 2016-03-18 (×3): qty 1

## 2016-03-18 MED ORDER — BENZONATATE 100 MG PO CAPS: 100.0000 mg | ORAL_CAPSULE | Freq: Three times a day (TID) | ORAL | Status: DC | PRN

## 2016-03-18 MED ORDER — SODIUM CHLORIDE 0.9 % IV BOLUS (SEPSIS)
500.0000 mL | Freq: Once | INTRAVENOUS | Status: AC
  Administered 2016-03-18: 500 mL via INTRAVENOUS

## 2016-03-18 NOTE — ED Notes (Signed)
Pt brought up to 1415 and transferred to care of floor RN.

## 2016-03-18 NOTE — H&P (Signed)
- History and Physical    [name] DGL:[id number] DOB: 08-03-1940 DOA: 03/18/2016    PCP: Binnie Rail, MD  Patient coming from: home  Chief Complaint: rectal bleed  HPI: [name] is a 76 y.o. female with medical history significant of hemorrhoids & diverticulosis per last colonoscopy on 8/17, CAD/ CABG on Plavix, COPD, DM who presents with rectal bleed starting last night. It was bright red blood mixed with stool at 10 PM, 2 AM and 7 AM. Prior to coming to the hospital in the afternoon, it was only blood. No c/o chest pain or dizziness. She did not have abdominal pain until after she got to the ER and then is was present in RLQ radiating to her back and rectum. No BMs in the hospital. Has h/o GERD and is on Protonix BID. Takes Motrin occasionally. She did not take her Plavix today.   ED Course: normal vital signs- Hb 10, CT abd/pelvis is unrevealing  Review of Systems:  All other systems reviewed and apart from HPI, are negative.  Past Medical History:  Diagnosis Date  . Acute myocardial infarction, unspecified site, episode of care unspecified   . Adenomatous colon polyp   . Anxiety   . Chest pain   . Coronary artery disease   . Diabetes mellitus   . GERD (gastroesophageal reflux disease)   . Hiatal hernia   . Hyperlipidemia   . Hypertension   . PONV (postoperative nausea and vomiting)   . Schatzki's ring   . Shoulder injury    resolved after shoulder surgery  . Unspecified asthma(493.90)   . Upper respiratory symptom    02-13-14 "runny nose, mild cough,sniffles" -instructed to inform MD if symptoms worsens    Past Surgical History:  Procedure Laterality Date  . ABDOMINAL HYSTERECTOMY    . APPENDECTOMY     came out with Hysterectomy  . CARDIAC CATHETERIZATION  07/23/2009   EF 60%  . CARDIAC CATHETERIZATION  10/11/2008  . CARDIAC CATHETERIZATION  03/01/2007   EF 75-80%  . CARDIOVASCULAR STRESS TEST  11/15/2007   EF 60%  . COLONOSCOPY    . CORONARY ARTERY BYPASS  GRAFT     SEVERELY DISEASED SAPHENOUS VEIN GRAFT TO THE RIGHT CORONARY ARTERY BUT WITH FAIRLY WELL PRESERVED FLOW TO THE DISTAL RIGHT CORONARY ARTERY FROM THE NATIVE CIRCULATION-RESTART  CATH IN JUNE 2000, REVEALS MILD/MODERATE  CAD WITH GOOD FLOW DOWN HER LAD  . ESOPHAGEAL DILATION    . ESOPHAGOGASTRODUODENOSCOPY    . EYE SURGERY     bilateral cataract surgery with lens implant  . lense removal Left   . ROTATOR CUFF REPAIR     right and left  . TONSILLECTOMY     age 26  . 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 tumor removed kidney    . US ECHOCARDIOGRAPHY  03/08/2008   EF 55-60%    Social History:   reports that she quit smoking about 40 years ago. Her smoking use included Cigarettes. She started smoking about 61 years ago. She has a 10.50 pack-year smoking history. She has never used smokeless tobacco. She reports that she does not drink alcohol or use drugs.  Lives alone and is quite independent.  Allergies  Allergen Reactions  . Amlodipine Other (See Comments)    hallucinations   . Banana Nausea And Vomiting  Stomach pumped  . Co Q10 [Coenzyme Q10]     Body cramps  . Codeine     Hallucinate, loose identity and don't know who I am  . Morphine Other (See Comments)    Can not function, it immobilizes me   . Statins     Muscle cramps  . Sulfonamide Derivatives Swelling    Family History  Problem Relation Age of Onset  . Heart disease Maternal Grandfather   . Heart failure Maternal Grandfather   . Diabetes Maternal Grandfather   . Heart attack Father   . Stomach cancer Father   . Diabetes Mother   . Rheum arthritis Sister   . Emphysema Paternal Uncle   . Esophageal cancer Brother 88    she said he was born with it  . Emphysema Paternal Aunt   . Neuropathy Neg Hx   . Multiple sclerosis Neg Hx   . Colon cancer Neg Hx       Prior to Admission medications   Medication Sig Start Date End Date Taking? Authorizing Provider  albuterol (PROVENTIL HFA;VENTOLIN HFA) 108 (90 BASE) MCG/ACT inhaler Inhale 2 puffs into the lungs every 6 (six) hours as needed. For shortness of breath. 03/06/12  Yes Elsie Stain, MD  albuterol (PROVENTIL) (2.5 MG/3ML) 0.083% nebulizer solution Take 3 mLs (2.5 mg total) by nebulization every 6 (six) hours as needed. For shortness of breath. Patient taking differently: Take 2.5 mg by nebulization every 6 (six) hours as needed for wheezing or shortness of breath.  11/20/14  Yes Javier Glazier, MD  ALPRAZolam Duanne Moron) 0.5 MG tablet Take 0.5 mg by mouth 2 (two) times daily as needed for anxiety.  12/12/15  Yes Historical Provider, MD  baclofen (LIORESAL) 20 MG tablet Take 20 mg by mouth daily.  01/21/14  Yes Historical Provider, MD  benzonatate (TESSALON) 100 MG capsule Take 100 mg by mouth 3 (three) times daily as needed for cough.   Yes Historical Provider, MD  Blood Glucose Monitoring Suppl (ONE TOUCH ULTRA MINI) w/Device KIT 3 (three) times daily. for testing 02/26/15  Yes Historical Provider, MD  Budesonide (PULMICORT FLEXHALER) 90 MCG/ACT inhaler Inhale 2 puffs into the lungs 2 (two) times daily. Patient taking differently: Inhale 2 puffs into the lungs 2 (two) times daily as needed (for shortness of breath).  02/13/16  Yes Javier Glazier, MD  budesonide (PULMICORT) 0.5 MG/2ML nebulizer solution Take 2 mLs (0.5 mg total) by nebulization 2 (two) times daily. Patient taking differently: Take 0.5 mg by nebulization 2 (two) times daily as needed (for shortness of breath).  11/20/14  Yes Javier Glazier, MD  Calcium & Magnesium Carbonates (MYLANTA PO) Take 1 tablet by mouth daily as needed (for indigestion).    Yes Historical Provider, MD  Cholecalciferol (VITAMIN D) 2000 UNITS tablet Take 2,000 Units by mouth daily.    Yes Historical Provider, MD  clopidogrel (PLAVIX) 75 MG tablet Take 1  tablet (75 mg total) by mouth every morning. 01/07/15  Yes Thayer Headings, MD  Cyanocobalamin (VITAMIN B-12 IJ) Inject as directed every 30 (thirty) days.   Yes Historical Provider, MD  cyclobenzaprine (FLEXERIL) 5 MG tablet TAKE 1 TABLET BY MOUTH AT BEDTIME 02/19/16  Yes Melvenia Beam, MD  diltiazem (CARDIZEM CD) 240 MG 24 hr capsule Take 1 capsule (240 mg total) by mouth daily. 08/13/14  Yes Thayer Headings, MD  doxazosin (CARDURA) 8 MG tablet Take 1 tablet (8 mg total) by mouth daily. 08/20/15  Yes  Thayer Headings, MD  fexofenadine (ALLEGRA) 180 MG tablet Take 1 tablet (180 mg total) by mouth daily. 09/04/15  Yes Javier Glazier, MD  formoterol (PERFOROMIST) 20 MCG/2ML nebulizer solution Take 2 mLs (20 mcg total) by nebulization 2 (two) times daily. 02/13/16  Yes Javier Glazier, MD  furosemide (LASIX) 40 MG tablet Take 40 mg by mouth every other day.    Yes Historical Provider, MD  insulin lispro protamine-insulin lispro (HUMALOG 75/25) (75-25) 100 UNIT/ML SUSP Inject 22-27 Units into the skin 2 (two) times daily with a meal. Takes 22 units in the morning and 27 units with supper   Yes Historical Provider, MD  isosorbide mononitrate (IMDUR) 60 MG 24 hr tablet Take 1 tablet (60 mg total) by mouth daily. 12/29/15 03/28/16 Yes Thayer Headings, MD  loperamide (IMODIUM) 2 MG capsule Take 2 mg by mouth as needed for diarrhea or loose stools.    Yes Historical Provider, MD  meclizine (ANTIVERT) 25 MG tablet Take 25 mg by mouth 2 (two) times daily as needed for dizziness.    Yes Historical Provider, MD  methocarbamol (ROBAXIN) 500 MG tablet Take 500 mg by mouth every 8 (eight) hours as needed for muscle spasms. 03/09/16  Yes Historical Provider, MD  metoprolol succinate (TOPROL-XL) 50 MG 24 hr tablet Take 1 tablet by mouth daily.   Yes Historical Provider, MD  montelukast (SINGULAIR) 5 MG chewable tablet Chew 1 tablet (5 mg total) by mouth at bedtime. 10/08/14  Yes Javier Glazier, MD  Multiple Vitamin  (MULTIVITAMIN WITH MINERALS) TABS Take 1 tablet by mouth daily.   Yes Historical Provider, MD  Nebulizers (COMPRESSOR/NEBULIZER) MISC Use with albuterol 04/24/14  Yes Elsie Stain, MD  ONE TOUCH ULTRA TEST test strip CHECK BLOOD SUGAR TWICE A DAY DX: E11.9 03/26/15  Yes Historical Provider, MD  [facility] DELICA LANCETS 70V MISC 3 (three) times daily. for testing 02/26/15  Yes Historical Provider, MD  Pancrelipase, Lip-Prot-Amyl, 24000 units CPEP Take 2 capsules (48,000 Units total) by mouth 3 (three) times daily with meals. Take 1 capsule with a snack 08/27/15  Yes Ladene Artist, MD  pantoprazole (PROTONIX) 40 MG tablet Take 1 tablet (40 mg total) by mouth 2 (two) times daily before a meal. Patient taking differently: Take 40 mg by mouth daily.  11/19/15  Yes Binnie Rail, MD  potassium chloride SA (K-DUR,KLOR-CON) 20 MEQ tablet Take 20 mEq by mouth 2 (two) times daily.   Yes Historical Provider, MD  traMADol (ULTRAM) 50 MG tablet Take 50 mg by mouth every 6 (six) hours as needed for moderate pain or severe pain.  03/12/16  Yes Historical Provider, MD  nitroGLYCERIN (NITROSTAT) 0.4 MG SL tablet Place 0.4 mg under the tongue every 5 (five) minutes x 3 doses as needed for chest pain.     Historical Provider, MD    Physical Exam: Vitals:   03/18/16 1204 03/18/16 1409 03/18/16 1617 03/18/16 1619  BP: 128/73 109/86 149/88   Pulse: 95 96 80   Resp: '18 18 16   ' Temp: 97.8 F (36.6 C)  97.6 F (36.4 C)   TempSrc: Oral  Oral   SpO2: 97% 100% 100% 100%      Constitutional: NAD, calm, comfortable Eyes: PERTLA, lids and conjunctivae normal ENMT: Mucous membranes are moist. Posterior pharynx clear of any exudate or lesions. Normal dentition.  Neck: normal, supple, no masses, no thyromegaly Respiratory: clear to auscultation bilaterally, no wheezing, no crackles. Normal respiratory effort. No accessory  muscle use.  Cardiovascular: S1 & S2 heard, regular rate and rhythm, no murmurs / rubs / gallops.  No extremity edema. 2+ pedal pulses. No carotid bruits.  Abdomen: No distension, mild tenderness RLQ, no masses palpated. No hepatosplenomegaly. Bowel sounds normal.  Musculoskeletal: no clubbing / cyanosis. No joint deformity upper and lower extremities. Good ROM, no contractures. Normal muscle tone.  Skin: no rashes, lesions, ulcers. No induration Neurologic: CN 2-12 grossly intact. Sensation intact, DTR normal. Strength 5/5 in all 4 limbs.  Psychiatric: Normal judgment and insight. Alert and oriented x 3. Normal mood.     Labs on Admission: I have personally reviewed following labs and imaging studies  CBC:  Recent Labs Lab 03/18/16 1218  WBC 5.4  HGB 10.6*  HCT 33.4*  MCV 81.3  PLT 811   Basic Metabolic Panel:  Recent Labs Lab 03/18/16 1218  NA 141  K 4.6  CL 105  CO2 30  GLUCOSE 169*  BUN 17  CREATININE 0.65  CALCIUM 8.7*   GFR: CrCl cannot be calculated (Unknown ideal weight.). Liver Function Tests:  Recent Labs Lab 03/18/16 1218  AST 16  ALT 13*  ALKPHOS 109  BILITOT 0.1*  PROT 6.7  ALBUMIN 3.9    Recent Labs Lab 03/18/16 1457  LIPASE 13   No results for input(s): AMMONIA in the last 168 hours. Coagulation Profile:  Recent Labs Lab 03/18/16 1457  INR 1.07   Cardiac Enzymes: No results for input(s): CKTOTAL, CKMB, CKMBINDEX, TROPONINI in the last 168 hours. BNP (last 3 results) No results for input(s): PROBNP in the last 8760 hours. HbA1C: No results for input(s): HGBA1C in the last 72 hours. CBG: No results for input(s): GLUCAP in the last 168 hours. Lipid Profile: No results for input(s): CHOL, HDL, LDLCALC, TRIG, CHOLHDL, LDLDIRECT in the last 72 hours. Thyroid Function Tests: No results for input(s): TSH, T4TOTAL, FREET4, T3FREE, THYROIDAB in the last 72 hours. Anemia Panel: No results for input(s): VITAMINB12, FOLATE, FERRITIN, TIBC, IRON, RETICCTPCT in the last 72 hours. Urine analysis:    Component Value Date/Time    COLORURINE YELLOW 03/18/2016 1450   APPEARANCEUR CLEAR 03/18/2016 1450   LABSPEC 1.015 03/18/2016 1450   PHURINE 5.0 03/18/2016 1450   GLUCOSEU NEGATIVE 03/18/2016 1450   HGBUR NEGATIVE 03/18/2016 1450   BILIRUBINUR NEGATIVE 03/18/2016 1450   KETONESUR NEGATIVE 03/18/2016 1450   PROTEINUR NEGATIVE 03/18/2016 1450   UROBILINOGEN 1.0 02/13/2014 1100   NITRITE POSITIVE (A) 03/18/2016 1450   LEUKOCYTESUR TRACE (A) 03/18/2016 1450   Sepsis Labs: '@LABRCNTIP' (procalcitonin:4,lacticidven:4) )No results found for this or any previous visit (from the past 240 hour(s)).   Radiological Exams on Admission: Ct Abdomen Pelvis W Contrast  Result Date: 03/18/2016 CLINICAL DATA:  RIGHT lower quadrant pain with rectal bleeding. EXAM: CT ABDOMEN AND PELVIS WITH CONTRAST TECHNIQUE: Multidetector CT imaging of the abdomen and pelvis was performed using the standard protocol following bolus administration of intravenous contrast. CONTRAST:  168m ISOVUE-300 IOPAMIDOL (ISOVUE-300) INJECTION 61% COMPARISON:  11/02/2012. FINDINGS: Lower chest: No acute abnormality.  Mitral annulus calcification. Hepatobiliary: No focal solid liver abnormality is seen. Status post cholecystectomy. No biliary dilatation. Tiny area of hypodensity in the dome of the liver on the RIGHT, statistically a simple cyst. Pancreas: Unremarkable. No pancreatic ductal dilatation or surrounding inflammatory changes. Spleen: Normal in size without focal mass. 1 cm cyst in the upper portion of the spleen, slightly increased from priors. Adrenals/Urinary Tract: Adrenal glands are unremarkable. Kidneys are normal, without renal calculi, focal lesion,  or hydronephrosis. Bladder is unremarkable. 3 x 4 simple cyst, exophytic from the anterior superior aspect of the LEFT kidney slightly larger from priors. Other tiny LEFT renal cysts and slightly greater than 1 cm RIGHT renal cyst demonstrate interval stability. Surgical clips in the retroperitoneum status post  removal of an angiomyolipoma, from the RIGHT kidney. Stomach/Bowel: Stomach is within normal limits. No appendiceal inflammation. No evidence of bowel wall thickening, distention, or inflammatory changes. Moderate stool burden. 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 No enlarged lymph nodes. Reproductive: Status post hysterectomy. No adnexal masses. Other: No abdominal wall hernia or abnormality. No abdominopelvic ascites. Musculoskeletal: No acute or significant osseous findings. IMPRESSION: No acute intra-abdominal or pelvic findings to explain rectal bleeding. Constipation. Electronically Signed   By: Staci Righter M.D.   On: 03/18/2016 16:25    EKG: Independently reviewed. Chronic RBBB  Assessment/Plan Principal Problem:   Lower GI bleed- GERD - bleeding starting last night- 4 episodes so far - h/o hemorrhoids and diverticulosis on last colonoscopy on 8/17 - [facility] consulted- they will see her in AM -  hold - last dose yesterday - clear liquids only for now - Hb Q 12 hrs- transfuse if < 8- follow on telemetry due to h/o CAD -cont BID PPI  Active Problems:   Coronary artery disease/ CABG  - holding Plavix - cont Lopressor with holding parameters - follow on telemetry - transfuse if Hb < 8    HTN (hypertension), benign - cont Lopressor, Cardizem and Imdur with holding parameters    Chronic diastolic CHF (congestive heart failure)  - hold Lasix for now due to bleeding - follow I and O and daily weights carefully  + UA - asymptomatic- do not treat    OSA (obstructive sleep apnea) - CPAP    Type 1 diabetes mellitus (HCC) -on 75/25 at home- states she is allergic to Lantus and Levemir- very vague about her reaction and states they make her sick  - will use SSI while here   - check A1c  Severe persistent Asthma -cont Nebs and inhalers- currently on room air- no wheezing   DVT prophylaxis:   SCDs Code Status:  Full code  Family Communication:  daughter Disposition Plan: telemetry bed  Consults called: [facility]  Admission status: inpatient    [facility] MD [facility] Pager: [email or web address] Password [email or web address] 7PM-7AM, please contact night-coverage   03/18/2016, 4:46 PM

## 2016-03-18 NOTE — Procedures (Signed)
Patient refused CPAP.  Stated she does not want to wear while in hospital.

## 2016-03-18 NOTE — Progress Notes (Signed)
[facility]-Note:  Aware that patient is here and have reviewed her chart and discussed patient with Dr. Ardis Hughs. There are no immediate plans for colonoscopy as we believe this is likely diverticular in nature. Would recommend patient remain on clear liquid diet overnight and a CT scan with GI bleed protocol should be ordered if there are further signs of acute bleeding/ patient becomes hemodynamically unstable overnight.  We will see patient in consult tomorrow morning.   Thank you for your kind consultation, Ellouise Newer, PA-C

## 2016-03-18 NOTE — ED Triage Notes (Signed)
Pt was having bm last night and noted bright red blood.  No hx of dx hemorrhoids.  Pt has had 3 episodes since.  No thinners.

## 2016-03-18 NOTE — ED Provider Notes (Signed)
[facility] DEPT Provider Note   CSN: [id number] Arrival date & time: 03/18/16  1157     History   Chief Complaint Chief Complaint  Patient presents with  . Rectal Bleeding    HPI [name] is a 76 y.o. female.  HPI Patient presents with 3 episodes of painless rectal bleeding starting yesterday evening around 10:30 PM. Light is bright red and feels the toilet. She's parents mild lightheadedness as any chest pain or shortness of breath. No fever or chills. Patient states she does take Plavix and aspirin. She's developed mild abdominal pain since being in the emergency department. The pain is in the lower right abdomen. She's had no nausea or vomiting. Denies dysuria, frequency or urgency. Past Medical History:  Diagnosis Date  . Acute myocardial infarction, unspecified site, episode of care unspecified   . Adenomatous colon polyp   . Anxiety   . Chest pain   . Coronary artery disease   . Diabetes mellitus   . GERD (gastroesophageal reflux disease)   . Hiatal hernia   . Hyperlipidemia   . Hypertension   . PONV (postoperative nausea and vomiting)   . Schatzki's ring   . Shoulder injury    resolved after shoulder surgery  . Unspecified asthma(493.90)   . Upper respiratory symptom    02-13-14 "runny nose, mild cough,sniffles" -instructed to inform MD if symptoms worsens    Patient Active Problem List   Diagnosis Date Noted  . Lower GI bleed 03/18/2016  . Type 1 diabetes mellitus ([place]) 07/21/2015  . Vitamin B12 deficiency 03/31/2015  . OSA (obstructive sleep apnea) 11/20/2014  . Fecal incontinence 08/28/2014  . 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 Falls 08/28/2014  . Neck pain of over 3 months duration 08/28/2014  . H/O total knee replacement 02/20/2014  . Obesity (BMI 30-39.9) 11/12/2013  . Chronic diastolic CHF (congestive heart failure) ([place]) 11/17/2012  . Meniscus, lateral, anterior horn derangement 11/10/2011  . Medial meniscus, posterior horn  derangement 11/10/2011  . Osteoarthritis of right knee 11/10/2011  . Vertigo 10/05/2011  . HTN (hypertension), benign 10/05/2011  . Coronary artery disease 08/27/2010  . MUSCLE CRAMPS 12/29/2009  . HEART ATTACK 11/19/2009  . Extrinsic asthma 11/19/2009  . GERD 11/19/2009  . Hyperlipidemia 11/18/2009    Past Surgical History:  Procedure Laterality Date  . ABDOMINAL HYSTERECTOMY    . APPENDECTOMY     came out with Hysterectomy  . CARDIAC CATHETERIZATION  07/23/2009   EF 60%  . CARDIAC CATHETERIZATION  10/11/2008  . CARDIAC CATHETERIZATION  03/01/2007   EF 75-80%  . CARDIOVASCULAR STRESS TEST  11/15/2007   EF 60%  . COLONOSCOPY    . CORONARY ARTERY BYPASS GRAFT     SEVERELY DISEASED SAPHENOUS VEIN GRAFT TO THE RIGHT CORONARY ARTERY BUT WITH FAIRLY WELL PRESERVED FLOW TO THE DISTAL RIGHT CORONARY ARTERY FROM THE NATIVE CIRCULATION-RESTART  CATH IN JUNE 2000, REVEALS MILD/MODERATE  CAD WITH GOOD FLOW DOWN HER LAD  . ESOPHAGEAL DILATION    . ESOPHAGOGASTRODUODENOSCOPY    . EYE SURGERY     bilateral cataract surgery with lens implant  . lense removal Left   . ROTATOR CUFF REPAIR     right and left  . TONSILLECTOMY     age 45  . 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 tumor removed kidney    . US  ECHOCARDIOGRAPHY  03/08/2008   EF 55-60%    OB History    No data available       Home Medications    Prior to Admission medications   Medication Sig Start Date End Date Taking? Authorizing Provider  albuterol (PROVENTIL HFA;VENTOLIN HFA) 108 (90 BASE) MCG/ACT inhaler Inhale 2 puffs into the lungs every 6 (six) hours as needed. For shortness of breath. 03/06/12  Yes Elsie Stain, MD  albuterol (PROVENTIL) (2.5 MG/3ML) 0.083% nebulizer solution Take 3 mLs (2.5 mg total) by nebulization every 6 (six) hours as needed. For shortness of  breath. Patient taking differently: Take 2.5 mg by nebulization every 6 (six) hours as needed for wheezing or shortness of breath.  11/20/14  Yes Javier Glazier, MD  ALPRAZolam Duanne Moron) 0.5 MG tablet Take 0.5 mg by mouth 2 (two) times daily as needed for anxiety.  12/12/15  Yes Historical Provider, MD  baclofen (LIORESAL) 20 MG tablet Take 20 mg by mouth daily.  01/21/14  Yes Historical Provider, MD  benzonatate (TESSALON) 100 MG capsule Take 100 mg by mouth 3 (three) times daily as needed for cough.   Yes Historical Provider, MD  Blood Glucose Monitoring Suppl (ONE TOUCH ULTRA MINI) w/Device KIT 3 (three) times daily. for testing 02/26/15  Yes Historical Provider, MD  Budesonide (PULMICORT FLEXHALER) 90 MCG/ACT inhaler Inhale 2 puffs into the lungs 2 (two) times daily. Patient taking differently: Inhale 2 puffs into the lungs 2 (two) times daily as needed (for shortness of breath).  02/13/16  Yes Javier Glazier, MD  budesonide (PULMICORT) 0.5 MG/2ML nebulizer solution Take 2 mLs (0.5 mg total) by nebulization 2 (two) times daily. Patient taking differently: Take 0.5 mg by nebulization 2 (two) times daily as needed (for shortness of breath).  11/20/14  Yes Javier Glazier, MD  Calcium & Magnesium Carbonates (MYLANTA PO) Take 1 tablet by mouth daily as needed (for indigestion).    Yes Historical Provider, MD  Cholecalciferol (VITAMIN D) 2000 UNITS tablet Take 2,000 Units by mouth daily.    Yes Historical Provider, MD  clopidogrel (PLAVIX) 75 MG tablet Take 1 tablet (75 mg total) by mouth every morning. 01/07/15  Yes Thayer Headings, MD  Cyanocobalamin (VITAMIN B-12 IJ) Inject as directed every 30 (thirty) days.   Yes Historical Provider, MD  cyclobenzaprine (FLEXERIL) 5 MG tablet TAKE 1 TABLET BY MOUTH AT BEDTIME 02/19/16  Yes Melvenia Beam, MD  diltiazem (CARDIZEM CD) 240 MG 24 hr capsule Take 1 capsule (240 mg total) by mouth daily. 08/13/14  Yes Thayer Headings, MD  doxazosin (CARDURA) 8 MG  tablet Take 1 tablet (8 mg total) by mouth daily. 08/20/15  Yes Thayer Headings, MD  fexofenadine (ALLEGRA) 180 MG tablet Take 1 tablet (180 mg total) by mouth daily. 09/04/15  Yes Javier Glazier, MD  formoterol (PERFOROMIST) 20 MCG/2ML nebulizer solution Take 2 mLs (20 mcg total) by nebulization 2 (two) times daily. 02/13/16  Yes Javier Glazier, MD  furosemide (LASIX) 40 MG tablet Take 40 mg by mouth every other day.    Yes Historical Provider, MD  insulin lispro protamine-insulin lispro (HUMALOG 75/25) (75-25) 100 UNIT/ML SUSP Inject 22-27 Units into the skin 2 (two) times daily with a meal. Takes 22 units in the morning and 27 units with supper   Yes Historical Provider, MD  isosorbide mononitrate (IMDUR) 60 MG 24 hr tablet Take 1 tablet (60 mg total) by mouth daily. 12/29/15 03/28/16 Yes Wonda Cheng  Nahser, MD  loperamide (IMODIUM) 2 MG capsule Take 2 mg by mouth as needed for diarrhea or loose stools.    Yes Historical Provider, MD  meclizine (ANTIVERT) 25 MG tablet Take 25 mg by mouth 2 (two) times daily as needed for dizziness.    Yes Historical Provider, MD  methocarbamol (ROBAXIN) 500 MG tablet Take 500 mg by mouth every 8 (eight) hours as needed for muscle spasms. 03/09/16  Yes Historical Provider, MD  metoprolol succinate (TOPROL-XL) 50 MG 24 hr tablet Take 1 tablet by mouth daily.   Yes Historical Provider, MD  montelukast (SINGULAIR) 5 MG chewable tablet Chew 1 tablet (5 mg total) by mouth at bedtime. 10/08/14  Yes Javier Glazier, MD  Multiple Vitamin (MULTIVITAMIN WITH MINERALS) TABS Take 1 tablet by mouth daily.   Yes Historical Provider, MD  Nebulizers (COMPRESSOR/NEBULIZER) MISC Use with albuterol 04/24/14  Yes Elsie Stain, MD  nitroGLYCERIN (NITROSTAT) 0.4 MG SL tablet Place 0.4 mg under the tongue every 5 (five) minutes x 3 doses as needed for chest pain.    Yes Historical Provider, MD  ONE TOUCH ULTRA TEST test strip CHECK BLOOD SUGAR TWICE A DAY DX: E11.9 03/26/15  Yes Historical  Provider, MD  [facility] DELICA LANCETS 16X MISC 3 (three) times daily. for testing 02/26/15  Yes Historical Provider, MD  Pancrelipase, Lip-Prot-Amyl, 24000 units CPEP Take 2 capsules (48,000 Units total) by mouth 3 (three) times daily with meals. Take 1 capsule with a snack 08/27/15  Yes Ladene Artist, MD  pantoprazole (PROTONIX) 40 MG tablet Take 1 tablet (40 mg total) by mouth 2 (two) times daily before a meal. Patient taking differently: Take 40 mg by mouth daily.  11/19/15  Yes Binnie Rail, MD  potassium chloride SA (K-DUR,KLOR-CON) 20 MEQ tablet Take 20 mEq by mouth 2 (two) times daily.   Yes Historical Provider, MD  traMADol (ULTRAM) 50 MG tablet Take 50 mg by mouth every 6 (six) hours as needed for moderate pain or severe pain.  03/12/16  Yes Historical Provider, MD    Family History Family History  Problem Relation Age of Onset  . Heart disease Maternal Grandfather   . Heart failure Maternal Grandfather   . Diabetes Maternal Grandfather   . Heart attack Father   . Stomach cancer Father   . Diabetes Mother   . Rheum arthritis Sister   . Emphysema Paternal Uncle   . Esophageal cancer Brother 70    she said he was born with it  . Emphysema Paternal Aunt   . Neuropathy Neg Hx   . Multiple sclerosis Neg Hx   . Colon cancer Neg Hx     Social History Social History  Substance Use Topics  . Smoking status: Former Smoker    Packs/day: 0.50    Years: 21.00    Types: Cigarettes    Start date: 02/09/1955    Quit date: 02/09/1976  . Smokeless tobacco: Never Used  . Alcohol use No     Allergies   Amlodipine; Banana; Co q10 [coenzyme q10]; Codeine; Morphine; Statins; and Sulfonamide derivatives   Review of Systems Review of Systems  Constitutional: Negative for chills, fatigue and fever.  Respiratory: Negative for chest tightness and shortness of breath.   Cardiovascular: Negative for chest pain, palpitations and leg swelling.  Gastrointestinal: Positive for abdominal pain and  anal bleeding. Negative for constipation, diarrhea, nausea and vomiting.  Genitourinary: Negative for dysuria, flank pain, frequency and hematuria.  Musculoskeletal: Negative for back  pain, myalgias, neck pain and neck stiffness.  Skin: Negative for rash and wound.  Neurological: Positive for dizziness and light-headedness. Negative for syncope, weakness, numbness and headaches.  All other systems reviewed and are negative.    Physical Exam Updated Vital Signs BP 149/88 (BP Location: Left Arm)   Pulse 80   Temp 97.6 F (36.4 C) (Oral)   Resp 16   SpO2 100%   Physical Exam  Constitutional: She is oriented to person, place, and time. She appears well-developed and well-nourished.  HENT:  Head: Normocephalic and atraumatic.  Mouth/Throat: Oropharynx is clear and moist.  Eyes: EOM are normal. Pupils are equal, round, and reactive to light.  Neck: Normal range of motion. Neck supple.  Cardiovascular: Normal rate and regular rhythm.   Pulmonary/Chest: Effort normal and breath sounds normal.  Abdominal: Soft. Bowel sounds are normal. She exhibits no distension. There is tenderness (very mildright lower quadrant tenderness to palpation. No rebound or guarding.). There is no rebound and no guarding.  Musculoskeletal: Normal range of motion. She exhibits no edema or tenderness.  Neurological: She is alert and oriented to person, place, and time.  Moving all extremities without deficit. Sensation fully intact.  Skin: Skin is warm and dry. No rash noted. No erythema.  Psychiatric: She has a normal mood and affect. Her behavior is normal.  Nursing note and vitals reviewed.    ED Treatments / Results  Labs (all labs ordered are listed, but only abnormal results are displayed) Labs Reviewed  COMPREHENSIVE METABOLIC PANEL - Abnormal; Notable for the following:       Result Value   Glucose, Bld 169 (*)    Calcium 8.7 (*)    ALT 13 (*)    Total Bilirubin 0.1 (*)    All other components  within normal limits  CBC - Abnormal; Notable for the following:    Hemoglobin 10.6 (*)    HCT 33.4 (*)    MCH 25.8 (*)    RDW 15.9 (*)    All other components within normal limits  URINALYSIS, ROUTINE W REFLEX MICROSCOPIC - Abnormal; Notable for the following:    Nitrite POSITIVE (*)    Leukocytes, UA TRACE (*)    Bacteria, UA RARE (*)    Squamous Epithelial / LPF 0-5 (*)    All other components within normal limits  POC OCCULT BLOOD, ED - Abnormal; Notable for the following:    Fecal Occult Bld POSITIVE (*)    All other components within normal limits  LIPASE, BLOOD  PROTIME-INR  APTT  TYPE AND SCREEN    EKG  EKG Interpretation None       Radiology Ct Abdomen Pelvis W Contrast  Result Date: 03/18/2016 CLINICAL DATA:  RIGHT lower quadrant pain with rectal bleeding. EXAM: CT ABDOMEN AND PELVIS WITH CONTRAST TECHNIQUE: Multidetector CT imaging of the abdomen and pelvis was performed using the standard protocol following bolus administration of intravenous contrast. CONTRAST:  124m ISOVUE-300 IOPAMIDOL (ISOVUE-300) INJECTION 61% COMPARISON:  11/02/2012. FINDINGS: Lower chest: No acute abnormality.  Mitral annulus calcification. Hepatobiliary: No focal solid liver abnormality is seen. Status post cholecystectomy. No biliary dilatation. Tiny area of hypodensity in the dome of the liver on the RIGHT, statistically a simple cyst. Pancreas: Unremarkable. No pancreatic ductal dilatation or surrounding inflammatory changes. Spleen: Normal in size without focal mass. 1 cm cyst in the upper portion of the spleen, slightly increased from priors. Adrenals/Urinary Tract: Adrenal glands are unremarkable. Kidneys are normal, without renal calculi, focal lesion,  or hydronephrosis. Bladder is unremarkable. 3 x 4 simple cyst, exophytic from the anterior superior aspect of the LEFT kidney slightly larger from priors. Other tiny LEFT renal cysts and slightly greater than 1 cm RIGHT renal cyst demonstrate  interval stability. 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 Stomach/Bowel: Stomach is within normal limits. No appendiceal inflammation. No evidence of bowel wall thickening, distention, or inflammatory changes. Moderate stool burden. 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 No enlarged lymph nodes. Reproductive: Status post hysterectomy. No adnexal masses. Other: No abdominal wall hernia or abnormality. No abdominopelvic ascites. Musculoskeletal: No acute or significant osseous findings. IMPRESSION: No acute intra-abdominal or pelvic findings to explain rectal bleeding. Constipation. Electronically Signed   By: Staci Righter M.D.   On: 03/18/2016 16:25    Procedures Procedures (including critical care time)  Medications Ordered in ED Medications  sodium chloride 0.9 % bolus 500 mL (500 mLs Intravenous New Bag/Given 03/18/16 1616)  iopamidol (ISOVUE-300) 61 % injection (not administered)  sodium chloride 0.9 % injection (not administered)  albuterol (PROVENTIL) (2.5 MG/3ML) 0.083% nebulizer solution 2.5 mg (not administered)  ALPRAZolam (XANAX) tablet 0.5 mg (not administered)  baclofen (LIORESAL) tablet 20 mg (not administered)  benzonatate (TESSALON) capsule 100 mg (not administered)  Budesonide (not administered)  Vitamin D 2,000 Units (not administered)  diltiazem (CARDIZEM CD) 24 hr capsule 240 mg (not administered)  doxazosin (CARDURA) tablet 8 mg (not administered)  formoterol (PERFOROMIST) nebulizer solution 20 mcg (not administered)  isosorbide mononitrate (IMDUR) 24 hr tablet 60 mg (not administered)  metoprolol succinate (TOPROL-XL) 24 hr tablet 50 mg (not administered)  montelukast (SINGULAIR) chewable tablet 5 mg (not administered)  lipase/protease/amylase (CREON) capsule 12,000 Units (not administered)  iopamidol (ISOVUE-300) 61 % injection 100 mL (100 mLs  Intravenous Contrast Given 03/18/16 1553)     Initial Impression / Assessment and Plan / ED Course  I have reviewed the triage vital signs and the nursing notes.  Pertinent labs & imaging results that were available during my care of the patient were reviewed by me and considered in my medical decision making (see chart for details).     Discussed with gastroenterology who will see the patient in the morning. Hospitalist to admit. Vital signs remained stable in the emergency department. No further rectal bleeding.  Final Clinical Impressions(s) / ED Diagnoses   Final diagnoses:  Lower GI bleed    New Prescriptions New Prescriptions   No medications on file     Julianne Rice, MD 03/18/16 1709

## 2016-03-18 NOTE — Telephone Encounter (Signed)
Agree. ED evaluation immediately.

## 2016-03-18 NOTE — Telephone Encounter (Signed)
Patient reports that she has been having large amounts of rectal bleeding since last night around 10:30.  The blood is bright red.  She is passing blood independent of stool.  She is advised to proceed to the ED now.  She is asked not to drive herself.  She verbalized understanding and has someone that can take her. She is on Plavix.

## 2016-03-19 DIAGNOSIS — K922 Gastrointestinal hemorrhage, unspecified: Secondary | ICD-10-CM | POA: Diagnosis not present

## 2016-03-19 DIAGNOSIS — J4550 Severe persistent asthma, uncomplicated: Secondary | ICD-10-CM | POA: Diagnosis not present

## 2016-03-19 DIAGNOSIS — I5032 Chronic diastolic (congestive) heart failure: Secondary | ICD-10-CM | POA: Diagnosis not present

## 2016-03-19 LAB — CBC
HEMATOCRIT: 29.6 % — AB (ref 36.0–46.0)
Hemoglobin: 9.6 g/dL — ABNORMAL LOW (ref 12.0–15.0)
MCH: 26.3 pg (ref 26.0–34.0)
MCHC: 32.4 g/dL (ref 30.0–36.0)
MCV: 81.1 fL (ref 78.0–100.0)
PLATELETS: 225 10*3/uL (ref 150–400)
RBC: 3.65 MIL/uL — ABNORMAL LOW (ref 3.87–5.11)
RDW: 15.8 % — AB (ref 11.5–15.5)
WBC: 4.5 10*3/uL (ref 4.0–10.5)

## 2016-03-19 LAB — BASIC METABOLIC PANEL
ANION GAP: 6 (ref 5–15)
BUN: 12 mg/dL (ref 6–20)
CALCIUM: 8.4 mg/dL — AB (ref 8.9–10.3)
CO2: 29 mmol/L (ref 22–32)
Chloride: 107 mmol/L (ref 101–111)
Creatinine, Ser: 0.56 mg/dL (ref 0.44–1.00)
GFR calc Af Amer: >60 mL/min (ref >60)
GLUCOSE: 112 mg/dL — AB (ref 65–99)
POTASSIUM: 3.8 mmol/L (ref 3.5–5.1)
Sodium: 142 mmol/L (ref 135–145)

## 2016-03-19 LAB — GLUCOSE, CAPILLARY
GLUCOSE-CAPILLARY: 128 mg/dL — AB (ref 65–99)
Glucose-Capillary: 130 mg/dL — ABNORMAL HIGH (ref 65–99)
Glucose-Capillary: 179 mg/dL — ABNORMAL HIGH (ref 65–99)
Glucose-Capillary: 172 mg/dL — ABNORMAL HIGH (ref 65–99)

## 2016-03-19 MED ORDER — PROCHLORPERAZINE EDISYLATE 5 MG/ML IJ SOLN
10.0000 mg | Freq: Four times a day (QID) | INTRAMUSCULAR | Status: DC | PRN
  Filled 2016-03-19: qty 2

## 2016-03-19 MED ORDER — DICLOFENAC SODIUM 1 % TD GEL
2.0000 g | Freq: Four times a day (QID) | TRANSDERMAL | Status: DC
  Administered 2016-03-19: 2 g via TOPICAL
  Filled 2016-03-19: qty 100

## 2016-03-19 MED ORDER — OXYCODONE-ACETAMINOPHEN 5-325 MG PO TABS
2.0000 | ORAL_TABLET | Freq: Once | ORAL | Status: AC
  Administered 2016-03-19: 2 via ORAL
  Filled 2016-03-19: qty 2

## 2016-03-19 MED ORDER — ONDANSETRON HCL 4 MG/2ML IJ SOLN
4.0000 mg | Freq: Four times a day (QID) | INTRAMUSCULAR | Status: DC | PRN
Start: 2016-03-19 — End: 2016-03-20
  Administered 2016-03-19 (×2): 4 mg via INTRAVENOUS
  Filled 2016-03-19 (×2): qty 2

## 2016-03-19 NOTE — Progress Notes (Signed)
PROGRESS NOTE    [name]   [id number]  DOB: 19-Jul-1940  DOA: 03/18/2016 PCP: Binnie Rail, MD   Brief Narrative:  [name] is a 76 y.o. female with medical history significant of hemorrhoids & diverticulosis per last colonoscopy on 8/17, CAD/ CABG on Plavix, COPD, DM who presents with rectal bleed starting last night. It was bright red blood mixed with stool at 10 PM, 2 AM and 7 AM. Prior to coming to the hospital in the afternoon, it was only blood. No c/o chest pain or dizziness. She did not have abdominal pain until after she got to the ER and then is was present in RLQ radiating to her back and rectum. No BMs in the hospital. Has h/o GERD and is on Protonix BID. Takes Motrin occasionally. She did not take her Plavix today.   Subjective: Had one blood BM this AM.   Assessment & Plan:  Principal Problem:   Lower GI bleed- GERD - h/o hemorrhoids and diverticulosis on last colonoscopy on 8/17 - [facility] consulted- diet advanced to full liquids today -  holding Plavix - last dose was the day before hospitalization - Hb Q 12 hrs- transfuse if < 8- follow on telemetry due to h/o CAD - cont BID PPI  Active Problems:   Coronary artery disease/ CABG - holding Plavix - cont Lopressor with holding parameters - follow on telemetry - transfuse if Hb < 8    HTN (hypertension), benign - cont Lopressor- hold Cardizem and Imdur today    Chronic diastolic CHF (congestive heart failure)  - holding Lasix for now due to bleeding - follow I and O and daily weights carefully  + UA - asymptomatic- do not treat    OSA (obstructive sleep apnea) - CPAP    Type 1 diabetes mellitus ([place]) -on 75/25 at home- states she is allergic to Lantus and Levemir- very vague about her reaction and states they make her sick  - will use SSI while here   - checking  A1c  Severe persistent Asthma -cont Nebs and inhalers- currently on room air- no wheezing   DVT prophylaxis:  SCDs Code Status: Full code Family Communication: daughter Disposition Plan: home when stable Consultants:   GI Procedures:    Antimicrobials:  Anti-infectives    None       Objective: Vitals:   03/18/16 2146 03/19/16 0446 03/19/16 0448 03/19/16 0848  BP: (!) 97/47 95/60  (!) 103/53  Pulse: 82 75    Resp: 18 16    Temp: 98.4 F (36.9 C) 98 F (36.7 C)    TempSrc: Oral Oral    SpO2: 100% 97%    Weight:   91.9 kg (202 lb 9.6 oz)   Height:        Intake/Output Summary (Last 24 hours) at 03/19/16 1230 Last data filed at 03/19/16 1137  Gross per 24 hour  Intake              500 ml  Output              100 ml  Net              400 ml   Filed Weights   03/18/16 1756 03/19/16 0448  Weight: 92.3 kg (203 lb 7.8 oz) 91.9 kg (202 lb 9.6 oz)    Examination: General exam: Appears comfortable  HEENT: PERRLA, oral mucosa moist, no sclera icterus or thrush Respiratory system: Clear to auscultation. Respiratory effort  normal. Cardiovascular system: S1 & S2 heard, RRR.  No murmurs  Gastrointestinal system: Abdomen soft, non-tender, nondistended. Normal bowel sound. No organomegaly Central nervous system: Alert and oriented. No focal neurological deficits. Extremities: No cyanosis, clubbing or edema Skin: No rashes or ulcers Psychiatry:  Mood & affect appropriate.     Data Reviewed: I have personally reviewed following labs and imaging studies  CBC:  Recent Labs Lab 03/18/16 1218 03/18/16 1846 03/19/16 0603  WBC 5.4 5.4 4.5  HGB 10.6* 10.7* 9.6*  HCT 33.4* 32.9* 29.6*  MCV 81.3 80.8 81.1  PLT 240 222 [id number]   Basic Metabolic Panel:  Recent Labs Lab 03/18/16 1218 03/19/16 0603  NA 141 142  K 4.6 3.8  CL 105 107  CO2 30 29  GLUCOSE 169* 112*  BUN 17 12  CREATININE 0.65 0.56  CALCIUM 8.7* 8.4*   GFR: Estimated Creatinine Clearance: 62.1 mL/min (by C-G formula based on SCr of 0.56 mg/dL). Liver Function Tests:  Recent Labs Lab 03/18/16 1218  AST 16   ALT 13*  ALKPHOS 109  BILITOT 0.1*  PROT 6.7  ALBUMIN 3.9    Recent Labs Lab 03/18/16 1457  LIPASE 13   No results for input(s): AMMONIA in the last 168 hours. Coagulation Profile:  Recent Labs Lab 03/18/16 1457  INR 1.07   Cardiac Enzymes: No results for input(s): CKTOTAL, CKMB, CKMBINDEX, TROPONINI in the last 168 hours. BNP (last 3 results) No results for input(s): PROBNP in the last 8760 hours. HbA1C: No results for input(s): HGBA1C in the last 72 hours. CBG:  Recent Labs Lab 03/18/16 1801 03/18/16 1851 03/18/16 2155 03/19/16 0740 03/19/16 1138  GLUCAP 68 102* 119* 130* 179*   Lipid Profile: No results for input(s): CHOL, HDL, LDLCALC, TRIG, CHOLHDL, LDLDIRECT in the last 72 hours. Thyroid Function Tests: No results for input(s): TSH, T4TOTAL, FREET4, T3FREE, THYROIDAB in the last 72 hours. Anemia Panel: No results for input(s): VITAMINB12, FOLATE, FERRITIN, TIBC, IRON, RETICCTPCT in the last 72 hours. Urine analysis:    Component Value Date/Time   COLORURINE YELLOW 03/18/2016 1450   APPEARANCEUR CLEAR 03/18/2016 1450   LABSPEC 1.015 03/18/2016 1450   PHURINE 5.0 03/18/2016 1450   GLUCOSEU NEGATIVE 03/18/2016 1450   HGBUR NEGATIVE 03/18/2016 1450   BILIRUBINUR NEGATIVE 03/18/2016 1450   KETONESUR NEGATIVE 03/18/2016 1450   PROTEINUR NEGATIVE 03/18/2016 1450   UROBILINOGEN 1.0 02/13/2014 1100   NITRITE POSITIVE (A) 03/18/2016 1450   LEUKOCYTESUR TRACE (A) 03/18/2016 1450   Sepsis Labs: @LABRCNTIP (procalcitonin:4,lacticidven:4) )No results found for this or any previous visit (from the past 240 hour(s)).       Radiology Studies: Ct Abdomen Pelvis W Contrast  Result Date: 03/18/2016 CLINICAL DATA:  RIGHT lower quadrant pain with rectal bleeding. EXAM: CT ABDOMEN AND PELVIS WITH CONTRAST TECHNIQUE: Multidetector CT imaging of the abdomen and pelvis was performed using the standard protocol following bolus administration of intravenous contrast.  CONTRAST:  123mL ISOVUE-300 IOPAMIDOL (ISOVUE-300) INJECTION 61% COMPARISON:  11/02/2012. FINDINGS: Lower chest: No acute abnormality.  Mitral annulus calcification. Hepatobiliary: No focal solid liver abnormality is seen. Status post cholecystectomy. No biliary dilatation. Tiny area of hypodensity in the dome of the liver on the RIGHT, statistically a simple cyst. Pancreas: Unremarkable. No pancreatic ductal dilatation or surrounding inflammatory changes. Spleen: Normal in size without focal mass. 1 cm cyst in the upper portion of the spleen, slightly increased from priors. Adrenals/Urinary Tract: Adrenal glands are unremarkable. Kidneys are normal, without renal calculi, focal lesion, or hydronephrosis. Bladder  is unremarkable. 3 x 4 simple cyst, exophytic from the anterior superior aspect of the LEFT kidney slightly larger from priors. Other tiny LEFT renal cysts and slightly greater than 1 cm RIGHT renal cyst demonstrate interval stability. 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 Stomach/Bowel: Stomach is within normal limits. No appendiceal inflammation. No evidence of bowel wall thickening, distention, or inflammatory changes. Moderate stool burden. 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 No enlarged lymph nodes. Reproductive: Status post hysterectomy. No adnexal masses. Other: No abdominal wall hernia or abnormality. No abdominopelvic ascites. Musculoskeletal: No acute or significant osseous findings. IMPRESSION: No acute intra-abdominal or pelvic findings to explain rectal bleeding. Constipation. Electronically Signed   By: Staci Righter M.D.   On: 03/18/2016 16:25      Scheduled Meds: . arformoterol  15 mcg Nebulization BID  . baclofen  20 mg Oral Daily  . budesonide  0.5 mg Nebulization BID  . cholecalciferol  2,000 Units Oral Daily  . diclofenac sodium  2 g Topical QID  .  diltiazem  240 mg Oral Daily  . doxazosin  8 mg Oral Daily  . insulin aspart  0-5 Units Subcutaneous QHS  . insulin aspart  0-9 Units Subcutaneous TID WC  . isosorbide mononitrate  60 mg Oral Daily  . lipase/protease/amylase  12,000 Units Oral TID WC  . metoprolol succinate  50 mg Oral Daily  . montelukast  5 mg Oral QHS  . pantoprazole (PROTONIX) IV  40 mg Intravenous Q12H   Continuous Infusions:   LOS: 1 day    Time spent in minutes: 30    [place], MD [facility] Pager: [email or web address] Password [email or web address] 03/19/2016, 12:30 PM

## 2016-03-19 NOTE — Consult Note (Signed)
Consultation  Referring Provider:  Dr. Wynelle Cleveland    Primary Care Physician:  Binnie Rail, MD Primary Gastroenterologist:  Dr. Fuller Plan       Reason for Consultation: LGIB             HPI:   [name] is a 76 y.o. female with past med hx of adenomatous polyps, anxiety, CAD on Plavix, DM, GERD, HLD, HTN, and others listed below, who presented to the ED on 03/18/16 with complaint of 3 episodes of painless rectal bleeding which started 03/17/16.   This morning, the patient is found in bedEating a clear liquid diet. She tells me that on 03/17/16 she had a bowel movement with some bright red blood mixed in around 10 PM, this occurred around again around 2 AM but was more just bright red blood that filled the toilet, she had a final passing of blood around 7 AM yesterday morning. She tells me that she lives at home alone, so was quite worried about all of this. She then proceeded to the ER. Since time of admission she has had 1 further bowel movement "streaked with bright red blood". She tells me that she also started with a right lower quadrant pain which "radiates through to my bowels", since she has been admitted. She has been given tramadol for this pain which did help but then caused her to be nauseous. Patient tells me she did not use her Plavix yesterday and that has been held again today. Patient denies any preceding abdominal pain or previous episodes of the same.   Patient describes a regularly she has 5-6 bowel movements per day which are loose in nature. This has been worked up by Dr. Layla Barter in the past as below. Patient stays on Creon and takes Imodium when necessary to avoid loose bowels well "out in public". This had not changed before episodes above.   Patient does tell me she has a history of reflux and did have a flare of the day that the bleeding started, but typically this is controlled with Protonix.   Patient's social history is positive for past ring a church.   Patient denies  fever, chills, melena, weight loss, fatigue, anorexia, vomiting, shortness of breath, dizziness or headache.  Previous GI History: 09/10/15-Colonoscopy, Dr. Fuller Plan: Impression: 83m polyps in transverse colon, internal hemorrhoids-Grade I, could not enter TI, many medium-mouthed diverticula in the sigmoid colon 08/18/15-OV, Dr. SFuller Plan for persistent diarrhea on imodium 4 tabs qd and out of Welchol-recommended colo above and stool studies 01/08/15-EGD, Dr. 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 Acute myocardial infarction, unspecified site, episode of care unspecified   . Adenomatous colon polyp   . Anxiety   . Chest pain   . Coronary artery disease   . Diabetes mellitus   . GERD (gastroesophageal reflux disease)   . Hiatal hernia   . Hyperlipidemia   . Hypertension   . PONV (postoperative nausea and vomiting)   . Schatzki's ring   . Shoulder injury    resolved after shoulder surgery  . Unspecified asthma(493.90)   . Upper respiratory symptom    02-13-14 "runny nose, mild cough,sniffles" -instructed to inform MD if symptoms worsens    Past Surgical History:  Procedure Laterality Date  . ABDOMINAL HYSTERECTOMY    .  APPENDECTOMY     came out with Hysterectomy  . CARDIAC CATHETERIZATION  07/23/2009   EF 60%  . CARDIAC CATHETERIZATION  10/11/2008  . CARDIAC CATHETERIZATION  03/01/2007   EF 75-80%  . CARDIOVASCULAR STRESS TEST  11/15/2007   EF 60%  . COLONOSCOPY    . CORONARY ARTERY BYPASS GRAFT     SEVERELY DISEASED SAPHENOUS VEIN GRAFT TO THE RIGHT CORONARY ARTERY BUT WITH FAIRLY WELL PRESERVED FLOW TO THE DISTAL RIGHT CORONARY ARTERY FROM THE NATIVE CIRCULATION-RESTART  CATH IN JUNE 2000, REVEALS MILD/MODERATE  CAD WITH GOOD FLOW DOWN HER LAD  . ESOPHAGEAL DILATION     . ESOPHAGOGASTRODUODENOSCOPY    . EYE SURGERY     bilateral cataract surgery with lens implant  . lense removal Left   . ROTATOR CUFF REPAIR     right and left  . TONSILLECTOMY     age 58  . 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 tumor removed kidney    . US ECHOCARDIOGRAPHY  03/08/2008   EF 55-60%    Family History  Problem Relation Age of Onset  . Heart disease Maternal Grandfather   . Heart failure Maternal Grandfather   . Diabetes Maternal Grandfather   . Heart attack Father   . Stomach cancer Father   . Diabetes Mother   . Rheum arthritis Sister   . Emphysema Paternal Uncle   . Esophageal cancer Brother 66    she said he was born with it  . Emphysema Paternal Aunt   . Neuropathy Neg Hx   . Multiple sclerosis Neg Hx   . Colon cancer Neg Hx     Social History  Substance Use Topics  . Smoking status: Former Smoker    Packs/day: 0.50    Years: 21.00    Types: Cigarettes    Start date: 02/09/1955    Quit date: 02/09/1976  . Smokeless tobacco: Never Used  . Alcohol use No    Prior to Admission medications   Medication Sig Start Date End Date Taking? Authorizing Provider  albuterol (PROVENTIL HFA;VENTOLIN HFA) 108 (90 BASE) MCG/ACT inhaler Inhale 2 puffs into the lungs every 6 (six) hours as needed. For shortness of breath. 03/06/12  Yes Elsie Stain, MD  albuterol (PROVENTIL) (2.5 MG/3ML) 0.083% nebulizer solution Take 3 mLs (2.5 mg total) by nebulization every 6 (six) hours as needed. For shortness of breath. Patient taking differently: Take 2.5 mg by nebulization every 6 (six) hours as needed for wheezing or shortness of breath.  11/20/14  Yes Javier Glazier, MD  ALPRAZolam Duanne Moron) 0.5 MG tablet Take 0.5 mg by mouth 2 (two) times daily as needed for anxiety.  12/12/15  Yes Historical Provider, MD  baclofen (LIORESAL) 20 MG  tablet Take 20 mg by mouth daily.  01/21/14  Yes Historical Provider, MD  benzonatate (TESSALON) 100 MG capsule Take 100 mg by mouth 3 (three) times daily as needed for cough.   Yes Historical Provider, MD  Blood Glucose Monitoring Suppl (ONE TOUCH ULTRA MINI) w/Device KIT 3 (three) times daily. for testing 02/26/15  Yes Historical Provider, MD  Budesonide (PULMICORT FLEXHALER) 90 MCG/ACT inhaler Inhale 2 puffs into the lungs 2 (two) times daily. Patient taking differently: Inhale 2 puffs into the lungs 2 (two) times daily as needed (for shortness of breath).  02/13/16  Yes Javier Glazier, MD  budesonide (PULMICORT) 0.5 MG/2ML nebulizer solution Take 2 mLs (0.5 mg total) by nebulization 2 (two) times daily. Patient taking differently: Take 0.5 mg by nebulization 2 (two) times daily as needed (for shortness of breath).  11/20/14  Yes Javier Glazier, MD  Calcium & Magnesium Carbonates (MYLANTA PO) Take 1 tablet by mouth daily as needed (for indigestion).    Yes Historical Provider, MD  Cholecalciferol (VITAMIN D) 2000 UNITS tablet Take 2,000 Units by mouth daily.    Yes Historical Provider, MD  clopidogrel (PLAVIX) 75 MG tablet Take 1 tablet (75 mg total) by mouth every morning. 01/07/15  Yes Thayer Headings, MD  Cyanocobalamin (VITAMIN B-12 IJ) Inject as directed every 30 (thirty) days.   Yes Historical Provider, MD  cyclobenzaprine (FLEXERIL) 5 MG tablet TAKE 1 TABLET BY MOUTH AT BEDTIME 02/19/16  Yes Melvenia Beam, MD  diltiazem (CARDIZEM CD) 240 MG 24 hr capsule Take 1 capsule (240 mg total) by mouth daily. 08/13/14  Yes Thayer Headings, MD  doxazosin (CARDURA) 8 MG tablet Take 1 tablet (8 mg total) by mouth daily. 08/20/15  Yes Thayer Headings, MD  fexofenadine (ALLEGRA) 180 MG tablet Take 1 tablet (180 mg total) by mouth daily. 09/04/15  Yes Javier Glazier, MD  formoterol (PERFOROMIST) 20 MCG/2ML nebulizer solution Take 2 mLs (20 mcg total) by nebulization 2 (two) times daily. 02/13/16  Yes  Javier Glazier, MD  furosemide (LASIX) 40 MG tablet Take 40 mg by mouth every other day.    Yes Historical Provider, MD  insulin lispro protamine-insulin lispro (HUMALOG 75/25) (75-25) 100 UNIT/ML SUSP Inject 22-27 Units into the skin 2 (two) times daily with a meal. Takes 22 units in the morning and 27 units with supper   Yes Historical Provider, MD  isosorbide mononitrate (IMDUR) 60 MG 24 hr tablet Take 1 tablet (60 mg total) by mouth daily. 12/29/15 03/28/16 Yes Thayer Headings, MD  loperamide (IMODIUM) 2 MG capsule Take 2 mg by mouth as needed for diarrhea or loose stools.    Yes Historical Provider, MD  meclizine (ANTIVERT) 25 MG tablet Take 25 mg by mouth 2 (two) times daily as needed for dizziness.    Yes Historical Provider, MD  methocarbamol (ROBAXIN) 500 MG tablet Take 500 mg by mouth every 8 (eight) hours as needed for muscle spasms. 03/09/16  Yes Historical Provider, MD  metoprolol succinate (TOPROL-XL) 50 MG 24 hr tablet Take 1 tablet by mouth daily.   Yes Historical Provider, MD  montelukast (SINGULAIR) 5 MG chewable tablet Chew 1 tablet (5 mg total) by mouth at bedtime. 10/08/14  Yes Javier Glazier, MD  Multiple Vitamin (MULTIVITAMIN WITH MINERALS) TABS Take 1 tablet by mouth daily.   Yes Historical Provider, MD  Nebulizers (COMPRESSOR/NEBULIZER) MISC Use with albuterol 04/24/14  Yes Elsie Stain, MD  nitroGLYCERIN (NITROSTAT) 0.4 MG SL tablet Place 0.4 mg under the tongue every 5 (five) minutes x 3 doses as needed for chest pain.    Yes Historical Provider, MD  ONE TOUCH ULTRA TEST test strip CHECK BLOOD SUGAR TWICE A DAY DX: E11.9 03/26/15  Yes Historical Provider, MD  [facility] DELICA LANCETS 24M MISC 3 (three) times daily. for testing 02/26/15  Yes Historical Provider, MD  Pancrelipase, Lip-Prot-Amyl, 24000 units CPEP Take 2 capsules (48,000 Units total) by mouth 3 (three) times daily with meals. Take 1 capsule with a snack 08/27/15  Yes Ladene Artist, MD  pantoprazole  (PROTONIX) 40 MG tablet Take 1  tablet (40 mg total) by mouth 2 (two) times daily before a meal. Patient taking differently: Take 40 mg by mouth daily.  11/19/15  Yes Binnie Rail, MD  potassium chloride SA (K-DUR,KLOR-CON) 20 MEQ tablet Take 20 mEq by mouth 2 (two) times daily.   Yes Historical Provider, MD  traMADol (ULTRAM) 50 MG tablet Take 50 mg by mouth every 6 (six) hours as needed for moderate pain or severe pain.  03/12/16  Yes Historical Provider, MD    Current Facility-Administered Medications  Medication Dose Route Frequency Provider Last Rate Last Dose  . albuterol (PROVENTIL) (2.5 MG/3ML) 0.083% nebulizer solution 2.5 mg  2.5 mg Nebulization Q6H PRN Debbe Odea, MD      . ALPRAZolam Duanne Moron) tablet 0.5 mg  0.5 mg Oral BID PRN Debbe Odea, MD      . arformoterol (BROVANA) nebulizer solution 15 mcg  15 mcg Nebulization BID Debbe Odea, MD   15 mcg at 03/18/16 2111  . baclofen (LIORESAL) tablet 20 mg  20 mg Oral Daily Debbe Odea, MD   20 mg at 03/18/16 2027  . benzonatate (TESSALON) capsule 100 mg  100 mg Oral TID PRN Debbe Odea, MD      . budesonide (PULMICORT) nebulizer solution 0.5 mg  0.5 mg Nebulization BID Debbe Odea, MD   0.5 mg at 03/18/16 2111  . cholecalciferol (VITAMIN D) tablet 2,000 Units  2,000 Units Oral Daily Debbe Odea, MD   2,000 Units at 03/19/16 0841  . diltiazem (CARDIZEM CD) 24 hr capsule 240 mg  240 mg Oral Daily Debbe Odea, MD   240 mg at 03/18/16 2027  . doxazosin (CARDURA) tablet 8 mg  8 mg Oral Daily Debbe Odea, MD   8 mg at 03/18/16 2227  . insulin aspart (novoLOG) injection 0-5 Units  0-5 Units Subcutaneous QHS Saima Rizwan, MD      . insulin aspart (novoLOG) injection 0-9 Units  0-9 Units Subcutaneous TID WC Debbe Odea, MD   1 Units at 03/19/16 0839  . isosorbide mononitrate (IMDUR) 24 hr tablet 60 mg  60 mg Oral Daily Debbe Odea, MD   60 mg at 03/18/16 2027  . lipase/protease/amylase (CREON) capsule 12,000 Units  12,000 Units Oral TID WC  Debbe Odea, MD   12,000 Units at 03/19/16 0840  . metoprolol succinate (TOPROL-XL) 24 hr tablet 50 mg  50 mg Oral Daily Debbe Odea, MD   50 mg at 03/18/16 2027  . montelukast (SINGULAIR) chewable tablet 5 mg  5 mg Oral QHS Debbe Odea, MD   5 mg at 03/18/16 2227  . pantoprazole (PROTONIX) injection 40 mg  40 mg Intravenous Q12H Debbe Odea, MD   40 mg at 03/19/16 0839    Allergies as of 03/18/2016 - Review Complete 03/18/2016  Allergen Reaction Noted  . Amlodipine Other (See Comments) 08/20/2010  . Banana Nausea And Vomiting 11/01/2011  . Co q10 [coenzyme q10]    . Codeine    . Morphine Other (See Comments)   . Statins  11/19/2015  . Sulfonamide derivatives Swelling      Review of Systems:    Constitutional: No weight loss, fever or chills Skin: No rash Cardiovascular: No chest pain Respiratory: No SOB  Gastrointestinal: See HPI and otherwise negative Genitourinary: No dysuria or change in urinary frequency Neurological: No headache Musculoskeletal: No new muscle or joint pain Hematologic: No bruising Psychiatric: No history of depression or anxiety   Physical Exam:  Vital signs in last 24 hours: Temp:  [97.6  F (36.4 C)-98.4 F (36.9 C)] 98 F (36.7 C) (02/09 0446) Pulse Rate:  [75-96] 75 (02/09 0446) Resp:  [16-18] 16 (02/09 0446) BP: (95-149)/(47-88) 95/60 (02/09 0446) SpO2:  [97 %-100 %] 97 % (02/09 0446) Weight:  [202 lb 9.6 oz (91.9 kg)-203 lb 7.8 oz (92.3 kg)] 202 lb 9.6 oz (91.9 kg) (02/09 0448) Last BM Date: 03/18/16 General:   Very pleasant African American female appears to be in NAD, Well developed, Well nourished, alert and cooperative Head:  Normocephalic and atraumatic. Eyes:   PEERL, EOMI. No icterus. Conjunctiva pink. Ears:  Normal auditory acuity. Neck:  Supple Throat: Oral cavity and pharynx without inflammation, swelling or lesion. Teeth in good condition. Lungs: Respirations even and unlabored. Lungs clear to auscultation bilaterally.   No  wheezes, crackles, or rhonchi.  Heart: Normal S1, S2. No MRG. Regular rate and rhythm. No peripheral edema, cyanosis or pallor.  Abdomen:  Soft, nondistended, Moderate RLQ tenderness with guarding. Normal bowel sounds. No appreciable masses or hepatomegaly. Rectal:  Not performed.  Msk:  Symmetrical without gross deformities.   Extremities:  Without edema, no deformity or joint abnormality.  Neurologic:  Alert and  oriented x4;  grossly normal neurologically Skin:   Dry and intact without significant lesions or rashes. Psychiatric:  Demonstrates good judgement and reason without abnormal affect or behaviors.  LAB RESULTS:  Recent Labs  03/18/16 1218 03/18/16 1846 03/19/16 0603  WBC 5.4 5.4 4.5  HGB 10.6* 10.7* 9.6*  HCT 33.4* 32.9* 29.6*  PLT 240 222 225   BMET  Recent Labs  03/18/16 1218 03/19/16 0603  NA 141 142  K 4.6 3.8  CL 105 107  CO2 30 29  GLUCOSE 169* 112*  BUN 17 12  CREATININE 0.65 0.56  CALCIUM 8.7* 8.4*   LFT  Recent Labs  03/18/16 1218  PROT 6.7  ALBUMIN 3.9  AST 16  ALT 13*  ALKPHOS 109  BILITOT 0.1*   PT/INR  Recent Labs  03/18/16 1457  LABPROT 13.9  INR 1.07    STUDIES: Ct Abdomen Pelvis W Contrast  Result Date: 03/18/2016 CLINICAL DATA:  RIGHT lower quadrant pain with rectal bleeding. EXAM: CT ABDOMEN AND PELVIS WITH CONTRAST TECHNIQUE: Multidetector CT imaging of the abdomen and pelvis was performed using the standard protocol following bolus administration of intravenous contrast. CONTRAST:  115m ISOVUE-300 IOPAMIDOL (ISOVUE-300) INJECTION 61% COMPARISON:  11/02/2012. FINDINGS: Lower chest: No acute abnormality.  Mitral annulus calcification. Hepatobiliary: No focal solid liver abnormality is seen. Status post cholecystectomy. No biliary dilatation. Tiny area of hypodensity in the dome of the liver on the RIGHT, statistically a simple cyst. Pancreas: Unremarkable. No pancreatic ductal dilatation or surrounding inflammatory changes.  Spleen: Normal in size without focal mass. 1 cm cyst in the upper portion of the spleen, slightly increased from priors. Adrenals/Urinary Tract: Adrenal glands are unremarkable. Kidneys are normal, without renal calculi, focal lesion, or hydronephrosis. Bladder is unremarkable. 3 x 4 simple cyst, exophytic from the anterior superior aspect of the LEFT kidney slightly larger from priors. Other tiny LEFT renal cysts and slightly greater than 1 cm RIGHT renal cyst demonstrate interval stability. 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 Stomach/Bowel: Stomach is within normal limits. No appendiceal inflammation. No evidence of bowel wall thickening, distention, or inflammatory changes. Moderate stool burden. 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 No enlarged lymph nodes. Reproductive: Status post hysterectomy. No adnexal masses. Other: No abdominal wall  hernia or abnormality. No abdominopelvic ascites. Musculoskeletal: No acute or significant osseous findings. IMPRESSION: No acute intra-abdominal or pelvic findings to explain rectal bleeding. Constipation. Electronically Signed   By: Staci Righter M.D.   On: 03/18/2016 16:25   PREVIOUS ENDOSCOPIES:            See HPI   Impression / Plan:   Impression: 1. LGIB: started 03/17/16 with 3 episodes brbpr, last one around 6pm last night, solid BM with blood "streaks" recent colonoscopy 09/2015-diverticulosis; Consider diverticular bleed most likely vs AVM vs other 2. Chronic Anticoagulation: with Plavix for CAD 3. ABLA: hgb down from 10.7-9.8 overnight 4. Constipation: found on CT above 5. RLQ abdominal pain: Consider relation to above vs vascular component to recent bleeding? No pain before time of event-monitor for now  Plan: 1. Continue to monitor hgb, transfusion <8 per hospitalist 2. Continue supportive measures 3. Observe for  hemodynamic instability, if sign of acute bleed again recommend CT scan with GI Bleed protocol 4. Continue holding Plavix 5. Will increase diet to full liquids for now 6. Please await any further recs from Dr. Ardis Hughs later this afternoon  Thank you for your kind consultation, we will continue to follow.  Lavone Nian [facility] [place]  03/19/2016, 8:47 AM Pager #: [phone]   ________________________________________________________________________  Velora Heckler GI MD note:  I personally examined the patient, reviewed the data and agree with the assessment and plan described above.  Relatively painless hematochezia in setting of known diverticulosis (last colonoscopy a year ago) and chronic blood thinner plavix.  No BM or bleeding of any kind in over 24 hours now.  This is likely a minor diveriticular bleed, has stopped already and hopefully will not restart.  Will observe her on liquid diet today, advance to solids tomorrow if no sign of rebleeding.  Possibly home tomorrow afternoon if no recurrent bleeding. She should hold plavix for another 3-4 days.  Owens Loffler, MD [facility] Gastroenterology Pager [phone]

## 2016-03-19 NOTE — Procedures (Signed)
Patient refused CPAP.  Stated she does not want to wear while in hospital.

## 2016-03-20 DIAGNOSIS — E109 Type 1 diabetes mellitus without complications: Secondary | ICD-10-CM | POA: Diagnosis not present

## 2016-03-20 DIAGNOSIS — K922 Gastrointestinal hemorrhage, unspecified: Secondary | ICD-10-CM | POA: Diagnosis not present

## 2016-03-20 DIAGNOSIS — I2510 Atherosclerotic heart disease of native coronary artery without angina pectoris: Secondary | ICD-10-CM | POA: Diagnosis not present

## 2016-03-20 DIAGNOSIS — I5032 Chronic diastolic (congestive) heart failure: Secondary | ICD-10-CM | POA: Diagnosis not present

## 2016-03-20 LAB — CBC
HEMATOCRIT: 31.2 % — AB (ref 36.0–46.0)
Hemoglobin: 9.8 g/dL — ABNORMAL LOW (ref 12.0–15.0)
MCH: 25.8 pg — AB (ref 26.0–34.0)
MCHC: 31.4 g/dL (ref 30.0–36.0)
MCV: 82.1 fL (ref 78.0–100.0)
Platelets: 228 10*3/uL (ref 150–400)
RBC: 3.8 MIL/uL — ABNORMAL LOW (ref 3.87–5.11)
RDW: 15.9 % — AB (ref 11.5–15.5)
WBC: 6.5 10*3/uL (ref 4.0–10.5)

## 2016-03-20 LAB — HEMOGLOBIN A1C
Hgb A1c MFr Bld: 6.1 % — ABNORMAL HIGH (ref 4.8–5.6)
MEAN PLASMA GLUCOSE: 128 mg/dL

## 2016-03-20 LAB — GLUCOSE, CAPILLARY
Glucose-Capillary: 141 mg/dL — ABNORMAL HIGH (ref 65–99)
Glucose-Capillary: 201 mg/dL — ABNORMAL HIGH (ref 65–99)

## 2016-03-20 MED ORDER — DICLOFENAC SODIUM 1 % TD GEL: 2.0000 g | TRANSDERMAL | Status: DC

## 2016-03-20 MED ORDER — DICLOFENAC SODIUM 1 % TD GEL
2.0000 g | TRANSDERMAL | Status: DC
  Administered 2016-03-20 (×2): 2 g via TOPICAL

## 2016-03-20 NOTE — Care Management Obs Status (Signed)
[facility] NOTIFICATION   Patient Details  Name: [name] MRN: [id number] Date of Birth: 11-17-1940   Medicare Observation Status Notification Given:  Yes    Erenest Rasher, RN 03/20/2016, 12:25 PM

## 2016-03-20 NOTE — Discharge Summary (Signed)
Physician Discharge Summary  [name] KGY:[id number] DOB: 1940/03/17 DOA: 03/18/2016  PCP: Binnie Rail, MD  Admit date: 03/18/2016 Discharge date: 03/20/2016  Admitted From: home Disposition:  Home with family   Recommendations for Outpatient Follow-up:  1. Follow for recurrence of bleeding  Home Health:  none  Equipment/Devices:  none    Discharge Condition:  stable   CODE STATUS:  Full    Diet recommendation:  Low sodium heart healthy diabetic Consultations:  GI- [place]   Discharge Diagnoses:  Principal Problem:   Lower GI bleed Active Problems:   Coronary artery disease   HTN (hypertension), benign   Chronic diastolic CHF (congestive heart failure) (HCC)   OSA (obstructive sleep apnea)   Type 1 diabetes mellitus (HCC)   Severe persistent asthma    Subjective: Pain in RLQ persists- Voltaren gel is helping. No bloody stool.  Brief Summary: [name] Ugarte Ratliffis a 76 y.o.femalewith medical history significant of hemorrhoids &diverticulosis per last colonoscopy on 8/17, CAD/ CABG on Plavix, COPD, DM who presents with rectal bleed starting last night. It was bright red blood mixed with stool at 10 PM, 2 AM and 7 AM. Prior to coming to the hospital in the afternoon, it was only blood. No c/o chest pain or dizziness. She did not have abdominal pain until after she got to the ER and then is was present in RLQ radiating to her back and rectum. No BMs in the hospital. Has h/o GERD and is on Protonix BID. Takes Motrin occasionally. She did not take her Plavix today.   Hospital Course:  Principal Problem: Lower GI bleed- GERD - noted to have hemorrhoids and diverticulosis on last colonoscopy on 8/17 - [facility] consulted- no need to scope as her bleeding has resolved and she recently had colonoscopy - diet advanced to full liquids 2/9 and to solids today- she is advised to cont to hold Plavix for 4 more days- last dose was the day before hospitalization - cont BID  PPI  Active Problems: Anemia due to acute blood loss - Hb 10.6 on admission and 9.8 today therefore only a mild drop - asymptomatic, no need to transfuse  Pain in RLQ - CT scan done on admission was negative for an etiology- likely muscular and Voltaren gel has been helping alleviate it- will need to continue to use it until pain resolves  Coronary artery disease/ CABG - holding Plavix for now - continued Lopressor with holding parameters- followed on telemetry  HTN (hypertension), benign - cont Lopressor- holding Cardizem and Imdur as SBP in 02-637C  Chronic diastolic CHF (congestive heart failure)  - holding Lasix for now due to bleeding - following I and O and daily weights carefully - Wt was 92 kg on admission and is 93 kg today- ok to resume lasix as bleeding has resolved  + UA - asymptomatic- do not treat  OSA (obstructive sleep apnea) - CPAP  Type 1 diabetes mellitus (HCC) -on 75/25 at home- states she is allergic to Lantus and Levemir- very vague about her reaction and states they make her sick  - usedSSI while admitted - sugars remained controlled - checked  A1c which was 6.1  Severe persistent Asthma -cont Nebs and inhalers- currently on room air- no wheezing   Discharge Instructions  Discharge Instructions    Diet - low sodium heart healthy    Complete by:  As directed    Diet Carb Modified    Complete by:  As directed  Discharge instructions    Complete by:  As directed    Do not take Plavix for 4 more days. Resume on Tuesday.   Increase activity slowly    Complete by:  As directed      Allergies as of 03/20/2016      Reactions   Amlodipine Other (See Comments)   hallucinations    Banana Nausea And Vomiting   Stomach pumped   Co Q10 [coenzyme Q10]    Body cramps   Codeine    Hallucinate, loose identity and don't know who I am   Morphine Other (See Comments)   Can not function, it immobilizes me    Statins    Muscle  cramps   Sulfonamide Derivatives Swelling      Medication List    STOP taking these medications   clopidogrel 75 MG tablet Commonly known as:  PLAVIX     TAKE these medications   albuterol 108 (90 Base) MCG/ACT inhaler Commonly known as:  PROVENTIL HFA;VENTOLIN HFA Inhale 2 puffs into the lungs every 6 (six) hours as needed. For shortness of breath. What changed:  Another medication with the same name was changed. Make sure you understand how and when to take each.   albuterol (2.5 MG/3ML) 0.083% nebulizer solution Commonly known as:  PROVENTIL Take 3 mLs (2.5 mg total) by nebulization every 6 (six) hours as needed. For shortness of breath. What changed:  reasons to take this  additional instructions   ALPRAZolam 0.5 MG tablet Commonly known as:  XANAX Take 0.5 mg by mouth 2 (two) times daily as needed for anxiety.   baclofen 20 MG tablet Commonly known as:  LIORESAL Take 20 mg by mouth daily.   benzonatate 100 MG capsule Commonly known as:  TESSALON Take 100 mg by mouth 3 (three) times daily as needed for cough.   budesonide 0.5 MG/2ML nebulizer solution Commonly known as:  PULMICORT Take 2 mLs (0.5 mg total) by nebulization 2 (two) times daily. What changed:  when to take this  reasons to take this   Budesonide 90 MCG/ACT inhaler Commonly known as:  PULMICORT FLEXHALER Inhale 2 puffs into the lungs 2 (two) times daily. What changed:  when to take this  reasons to take this   Compressor/Nebulizer Misc Use with albuterol   cyclobenzaprine 5 MG tablet Commonly known as:  FLEXERIL TAKE 1 TABLET BY MOUTH AT BEDTIME   diclofenac sodium 1 % Gel Commonly known as:  VOLTAREN Apply 2 g topically every 4 (four) hours.   diltiazem 240 MG 24 hr capsule Commonly known as:  CARDIZEM CD Take 1 capsule (240 mg total) by mouth daily.   doxazosin 8 MG tablet Commonly known as:  CARDURA Take 1 tablet (8 mg total) by mouth daily.   fexofenadine 180 MG  tablet Commonly known as:  ALLEGRA Take 1 tablet (180 mg total) by mouth daily.   formoterol 20 MCG/2ML nebulizer solution Commonly known as:  PERFOROMIST Take 2 mLs (20 mcg total) by nebulization 2 (two) times daily.   furosemide 40 MG tablet Commonly known as:  LASIX Take 40 mg by mouth every other day.   insulin lispro protamine-lispro (75-25) 100 UNIT/ML Susp injection Commonly known as:  HUMALOG 75/25 MIX Inject 22-27 Units into the skin 2 (two) times daily with a meal. Takes 22 units in the morning and 27 units with supper   isosorbide mononitrate 60 MG 24 hr tablet Commonly known as:  IMDUR Take 1 tablet (60 mg total)  by mouth daily.   loperamide 2 MG capsule Commonly known as:  IMODIUM Take 2 mg by mouth as needed for diarrhea or loose stools.   meclizine 25 MG tablet Commonly known as:  ANTIVERT Take 25 mg by mouth 2 (two) times daily as needed for dizziness.   methocarbamol 500 MG tablet Commonly known as:  ROBAXIN Take 500 mg by mouth every 8 (eight) hours as needed for muscle spasms.   metoprolol succinate 50 MG 24 hr tablet Commonly known as:  TOPROL-XL Take 1 tablet by mouth daily.   montelukast 5 MG chewable tablet Commonly known as:  SINGULAIR Chew 1 tablet (5 mg total) by mouth at bedtime.   multivitamin with minerals Tabs tablet Take 1 tablet by mouth daily.   MYLANTA PO Take 1 tablet by mouth daily as needed (for indigestion).   nitroGLYCERIN 0.4 MG SL tablet Commonly known as:  NITROSTAT Place 0.4 mg under the tongue every 5 (five) minutes x 3 doses as needed for chest pain.   ONE TOUCH ULTRA MINI w/Device Kit 3 (three) times daily. for testing   ONE TOUCH ULTRA TEST test strip Generic drug:  glucose blood CHECK BLOOD SUGAR TWICE A DAY DX: G92.0   ONETOUCH DELICA LANCETS 10O Misc 3 (three) times daily. for testing   Pancrelipase (Lip-Prot-Amyl) 24000-76000 units Cpep Take 2 capsules (48,000 Units total) by mouth 3 (three) times daily  with meals. Take 1 capsule with a snack   pantoprazole 40 MG tablet Commonly known as:  PROTONIX Take 1 tablet (40 mg total) by mouth 2 (two) times daily before a meal. What changed:  when to take this   potassium chloride SA 20 MEQ tablet Commonly known as:  K-DUR,KLOR-CON Take 20 mEq by mouth 2 (two) times daily.   traMADol 50 MG tablet Commonly known as:  ULTRAM Take 50 mg by mouth every 6 (six) hours as needed for moderate pain or severe pain.   VITAMIN B-12 IJ Inject as directed every 30 (thirty) days.   Vitamin D 2000 units tablet Take 2,000 Units by mouth daily.       Allergies  Allergen Reactions  . Amlodipine Other (See Comments)    hallucinations   . Banana Nausea And Vomiting    Stomach pumped  . Co Q10 [Coenzyme Q10]     Body cramps  . Codeine     Hallucinate, loose identity and don't know who I am  . Morphine Other (See Comments)    Can not function, it immobilizes me   . Statins     Muscle cramps  . Sulfonamide Derivatives Swelling     Procedures/Studies:   Ct Abdomen Pelvis W Contrast  Result Date: 03/18/2016 CLINICAL DATA:  RIGHT lower quadrant pain with rectal bleeding. EXAM: CT ABDOMEN AND PELVIS WITH CONTRAST TECHNIQUE: Multidetector CT imaging of the abdomen and pelvis was performed using the standard protocol following bolus administration of intravenous contrast. CONTRAST:  154m ISOVUE-300 IOPAMIDOL (ISOVUE-300) INJECTION 61% COMPARISON:  11/02/2012. FINDINGS: Lower chest: No acute abnormality.  Mitral annulus calcification. Hepatobiliary: No focal solid liver abnormality is seen. Status post cholecystectomy. No biliary dilatation. Tiny area of hypodensity in the dome of the liver on the RIGHT, statistically a simple cyst. Pancreas: Unremarkable. No pancreatic ductal dilatation or surrounding inflammatory changes. Spleen: Normal in size without focal mass. 1 cm cyst in the upper portion of the spleen, slightly increased from priors.  Adrenals/Urinary Tract: Adrenal glands are unremarkable. Kidneys are normal, without renal calculi, focal lesion,  or hydronephrosis. Bladder is unremarkable. 3 x 4 simple cyst, exophytic from the anterior superior aspect of the LEFT kidney slightly larger from priors. Other tiny LEFT renal cysts and slightly greater than 1 cm RIGHT renal cyst demonstrate interval stability. 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 Stomach/Bowel: Stomach is within normal limits. No appendiceal inflammation. No evidence of bowel wall thickening, distention, or inflammatory changes. Moderate stool burden. 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 No enlarged lymph nodes. Reproductive: Status post hysterectomy. No adnexal masses. Other: No abdominal wall hernia or abnormality. No abdominopelvic ascites. Musculoskeletal: No acute or significant osseous findings. IMPRESSION: No acute intra-abdominal or pelvic findings to explain rectal bleeding. Constipation. Electronically Signed   By: Staci Righter M.D.   On: 03/18/2016 16:25       Discharge Exam: Vitals:   03/19/16 2120 03/20/16 0458  BP: 126/65 119/65  Pulse: 74 86  Resp: 18 16  Temp: 98.5 F (36.9 C) 99 F (37.2 C)   Vitals:   03/19/16 2002 03/19/16 2120 03/20/16 0458 03/20/16 0500  BP:  126/65 119/65   Pulse: 72 74 86   Resp: '16 18 16   ' Temp:  98.5 F (36.9 C) 99 F (37.2 C)   TempSrc:  Oral Oral   SpO2:  95% 93%   Weight:    93.2 kg (205 lb 6.4 oz)  Height:        General: Pt is alert, awake, not in acute distress Cardiovascular: RRR, S1/S2 +, no rubs, no gallops Respiratory: CTA bilaterally, no wheezing, no rhonchi Abdominal: Soft, NT, ND, bowel sounds + Extremities: no edema, no cyanosis    The results of significant diagnostics from this hospitalization (including imaging, microbiology, ancillary and laboratory) are listed  below for reference.     Microbiology: No results found for this or any previous visit (from the past 240 hour(s)).   Labs: BNP (last 3 results) No results for input(s): BNP in the last 8760 hours. Basic Metabolic Panel:  Recent Labs Lab 03/18/16 1218 03/19/16 0603  NA 141 142  K 4.6 3.8  CL 105 107  CO2 30 29  GLUCOSE 169* 112*  BUN 17 12  CREATININE 0.65 0.56  CALCIUM 8.7* 8.4*   Liver Function Tests:  Recent Labs Lab 03/18/16 1218  AST 16  ALT 13*  ALKPHOS 109  BILITOT 0.1*  PROT 6.7  ALBUMIN 3.9    Recent Labs Lab 03/18/16 1457  LIPASE 13   No results for input(s): AMMONIA in the last 168 hours. CBC:  Recent Labs Lab 03/18/16 1218 03/18/16 1846 03/19/16 0603 03/20/16 0538  WBC 5.4 5.4 4.5 6.5  HGB 10.6* 10.7* 9.6* 9.8*  HCT 33.4* 32.9* 29.6* 31.2*  MCV 81.3 80.8 81.1 82.1  PLT 240 222 225 228   Cardiac Enzymes: No results for input(s): CKTOTAL, CKMB, CKMBINDEX, TROPONINI in the last 168 hours. BNP: Invalid input(s): POCBNP CBG:  Recent Labs Lab 03/19/16 0740 03/19/16 1138 03/19/16 1646 03/19/16 2123 03/20/16 0742  GLUCAP 130* 179* 128* 172* 141*   D-Dimer No results for input(s): DDIMER in the last 72 hours. Hgb A1c  Recent Labs  03/19/16 0603  HGBA1C 6.1*   Lipid Profile No results for input(s): CHOL, HDL, LDLCALC, TRIG, CHOLHDL, LDLDIRECT in the last 72 hours. Thyroid function studies No results for input(s): TSH, T4TOTAL, T3FREE, THYROIDAB in the last 72 hours.  Invalid input(s): FREET3 Anemia work up No results for input(s):  VITAMINB12, FOLATE, FERRITIN, TIBC, IRON, RETICCTPCT in the last 72 hours. Urinalysis    Component Value Date/Time   COLORURINE YELLOW 03/18/2016 1450   APPEARANCEUR CLEAR 03/18/2016 1450   LABSPEC 1.015 03/18/2016 1450   PHURINE 5.0 03/18/2016 1450   GLUCOSEU NEGATIVE 03/18/2016 1450   HGBUR NEGATIVE 03/18/2016 1450   BILIRUBINUR NEGATIVE 03/18/2016 1450   KETONESUR NEGATIVE 03/18/2016  1450   PROTEINUR NEGATIVE 03/18/2016 1450   UROBILINOGEN 1.0 02/13/2014 1100   NITRITE POSITIVE (A) 03/18/2016 1450   LEUKOCYTESUR TRACE (A) 03/18/2016 1450   Sepsis Labs Invalid input(s): PROCALCITONIN,  WBC,  LACTICIDVEN Microbiology No results found for this or any previous visit (from the past 240 hour(s)).   Time coordinating discharge: Over 30 minutes  SIGNED:   Debbe Odea, MD  [facility] 03/20/2016, 9:29 AM Pager   If 7PM-7AM, please contact night-coverage [email or web address] Password [email or web address]

## 2016-03-20 NOTE — Progress Notes (Signed)
Patient nebulizer delivered. Patient stated, "Nobody went over this paperwork with me." Went over patients paperwork again. Denies any questions. Awaiting ride to arrive.

## 2016-03-20 NOTE — Progress Notes (Signed)
Progress Note   Subjective  Chief Complaint:LGIB  This morning the patient is found laying in bed. She tells me that she has done well overnight and passed no further blood. She does tell me she had a "severe episode of right lower quadrant abdominal pain" this morning. She believes that her pain medication wore off and resulted in this pain, it is better now. She does ask me questions regarding if this "bleeding" could happen again. She also asked me questions regarding "the amount of blood pressure medicine" I'm on. The patient feels like this could be contributing to her chronic fatigue.    Objective   Vital signs in last 24 hours: Temp:  [97.8 F (36.6 C)-99 F (37.2 C)] 99 F (37.2 C) (02/10 0458) Pulse Rate:  [59-86] 86 (02/10 0458) Resp:  [16-18] 16 (02/10 0458) BP: (100-126)/(52-65) 119/65 (02/10 0458) SpO2:  [93 %-96 %] 93 % (02/10 0458) Weight:  [205 lb 6.4 oz (93.2 kg)] 205 lb 6.4 oz (93.2 kg) (02/10 0500) Last BM Date: 03/19/16 General: African American female in NAD Heart:  Regular rate and rhythm; no murmurs Lungs: Respirations even and unlabored, lungs CTA bilaterally Abdomen:  Soft, mild RLQ abdominal pain and nondistended. Normal bowel sounds. Extremities:  Without edema. Neurologic:  Alert and oriented,  grossly normal neurologically. Psych:  Cooperative. Normal mood and affect.  Intake/Output from previous day: 02/09 0701 - 02/10 0700 In: -  Out: 100 [Emesis/NG output:100]  Lab Results:  Recent Labs  03/18/16 1846 03/19/16 0603 03/20/16 0538  WBC 5.4 4.5 6.5  HGB 10.7* 9.6* 9.8*  HCT 32.9* 29.6* 31.2*  PLT 222 225 228   BMET  Recent Labs  03/18/16 1218 03/19/16 0603  NA 141 142  K 4.6 3.8  CL 105 107  CO2 30 29  GLUCOSE 169* 112*  BUN 17 12  CREATININE 0.65 0.56  CALCIUM 8.7* 8.4*   LFT  Recent Labs  03/18/16 1218  PROT 6.7  ALBUMIN 3.9  AST 16  ALT 13*  ALKPHOS 109  BILITOT 0.1*   PT/INR  Recent Labs  03/18/16 1457    LABPROT 13.9  INR 1.07    Studies/Results: Ct Abdomen Pelvis W Contrast  Result Date: 03/18/2016 CLINICAL DATA:  RIGHT lower quadrant pain with rectal bleeding. EXAM: CT ABDOMEN AND PELVIS WITH CONTRAST TECHNIQUE: Multidetector CT imaging of the abdomen and pelvis was performed using the standard protocol following bolus administration of intravenous contrast. CONTRAST:  175mL ISOVUE-300 IOPAMIDOL (ISOVUE-300) INJECTION 61% COMPARISON:  11/02/2012. FINDINGS: Lower chest: No acute abnormality.  Mitral annulus calcification. Hepatobiliary: No focal solid liver abnormality is seen. Status post cholecystectomy. No biliary dilatation. Tiny area of hypodensity in the dome of the liver on the RIGHT, statistically a simple cyst. Pancreas: Unremarkable. No pancreatic ductal dilatation or surrounding inflammatory changes. Spleen: Normal in size without focal mass. 1 cm cyst in the upper portion of the spleen, slightly increased from priors. Adrenals/Urinary Tract: Adrenal glands are unremarkable. Kidneys are normal, without renal calculi, focal lesion, or hydronephrosis. Bladder is unremarkable. 3 x 4 simple cyst, exophytic from the anterior superior aspect of the LEFT kidney slightly larger from priors. Other tiny LEFT renal cysts and slightly greater than 1 cm RIGHT renal cyst demonstrate interval stability. 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 Stomach/Bowel: Stomach is within normal limits. No appendiceal inflammation. No evidence of bowel wall thickening, distention, or inflammatory changes. Moderate stool burden. Vascular/Lymphatic: Advanced atherosclerosis  of the aortoiliac system without aneurysmal dilatation. No enlarged lymph nodes. Reproductive: Status post hysterectomy. No adnexal masses. Other: No abdominal wall hernia or abnormality. No abdominopelvic ascites. Musculoskeletal: No acute or significant osseous findings. IMPRESSION: No acute  intra-abdominal or pelvic findings to explain rectal bleeding. Constipation. Electronically Signed   By: Staci Righter M.D.   On: 03/18/2016 16:25       Assessment / Plan:   Assessment: 1. Lower GI bleed: A total of 4 episodes of bright red blood per rectum, none seen in the past 24-36 hours, hemoglobin now improved; likely this is diverticular in nature 2. Chronic anticoagulation: With Plavix for CAD, this has been held during hospitalization 3. Acute blood loss anemia: Improved overnight from 9.6-9.8 4. Constipation: Chronic for the patient, thought possibly related to abdominal pain below 5. Right lower quadrant abdominal pain: Continues, off and on see above  Plan: 1. Patient has had no further signs of bleeding overnight and remains stable, okay with discharge later this afternoon 2. Per Dr. Ardis Hughs, patient is advised to continue holding her Plavix for the next 2-3 days 3. Will arrange for follow-up office visit with myself or Dr. Ardis Hughs in the next 2-3 weeks to discuss right lower quadrant pain and constipation  Thank you for your kind consultation, we will sign off   LOS: 2 days   Levin Erp  03/20/2016, 11:05 AM  Pager # [phone]   ________________________________________________________________________  Velora Heckler GI MD note:  I personally examined the patient, reviewed the data and agree with the assessment and plan described above.   Owens Loffler, MD [facility] Pager [phone]

## 2016-03-20 NOTE — Care Management Note (Addendum)
Case Management Note  Patient Details  Name: [name] MRN: [id number] Date of Birth: 03/17/1940  Subjective/Objective:  Lower GI bleed                  Action/Plan: Discharge Planning: AVS reviewed:  NCM spoke to pt and states she was independent prior to hospital stay. Lives alone. No problems getting medications. Pt states her neb machine is 78 years old.   PCP BURNS, Claudina Lick   Expected Discharge Date:  03/20/16               Expected Discharge Plan:  Home/Self Care  In-House Referral:  NA  Discharge planning Services  CM Consult  Post Acute Care Choice:  NA Choice offered to:  NA  DME Arranged:  N/A DME Agency:  NA  HH Arranged:  NA HH Agency:  NA  Status of Service:  Completed, signed off  If discussed at [place] of Stay Meetings, dates discussed:    Additional Comments:  Erenest Rasher, RN 03/20/2016, 2:43 PM

## 2016-03-20 NOTE — Care Management CC44 (Signed)
Condition Code 44 Documentation Completed  Patient Details  Name: [name] MRN: [id number] Date of Birth: March 28, 1940   Condition Code 44 given:  Yes Patient signature on Condition Code 44 notice:  Yes Documentation of 2 MD's agreement:  Yes Code 44 added to claim:  Yes    Erenest Rasher, RN 03/20/2016, 12:25 PM

## 2016-03-20 NOTE — Progress Notes (Signed)
Contacted [facility] DME rep for neb machine for home. Jamonte Curfman RN CCM Case Mgmt phone [phone] 

## 2016-03-20 NOTE — Progress Notes (Signed)
Taking over care of patient. Agree with previous RN assessment. Patient is discharged and paperwork has been given, awaiting nebulizer to be delivered per previous RN. Will continue to monitor.

## 2016-03-22 ENCOUNTER — Telehealth: Payer: Self-pay

## 2016-03-22 NOTE — Telephone Encounter (Signed)
Pt has been notified and verbalized understanding. 

## 2016-03-22 NOTE — Telephone Encounter (Signed)
Appt is 04/12/16 8:45 am with [name] Left message on machine to call back

## 2016-03-22 NOTE — Telephone Encounter (Signed)
-----   Message from [place], [place] sent at 03/20/2016 11:10 AM EST ----- Regarding: Pt needs OV Patient d/c today, needs follow up OV with Dr. Ardis Hughs or myself in 2-3 weeks for RLQ abdominal pain and chronic constipation. I told her you would be calling with appt info.  Thanks- Ellouise Newer, PA-C

## 2016-03-23 ENCOUNTER — Telehealth: Payer: Self-pay | Admitting: Gastroenterology

## 2016-03-24 NOTE — Telephone Encounter (Signed)
Pt states she has already spoken with someone and has no questions or concerns

## 2016-03-25 ENCOUNTER — Other Ambulatory Visit: Payer: Self-pay | Admitting: Cardiovascular Disease

## 2016-03-25 ENCOUNTER — Telehealth: Payer: Self-pay | Admitting: Cardiovascular Disease

## 2016-03-25 MED ORDER — METOPROLOL SUCCINATE ER 50 MG PO TB24: 50.0000 mg | ORAL_TABLET | Freq: Every day | ORAL | 10 refills | Status: DC

## 2016-03-25 NOTE — Telephone Encounter (Addendum)
**Note De-Identified [name] Obfuscation** The pt states that she has been having "bad" headaches for a couple years that she has discussed with her PCP. She reports that her PCP even had her do a CAT scan a while back trying to figure out why she has headaches so often but she got no answers.  She states that she was in the hospital recently for diverticulitis. She reports that while in the hospital her Plavix, Cardura and Diltiazem was held because of bleeding risk and because her BP dropped. Her Metoprolol was not held during this time and she has been taking it all along.   She states that she was advised to resume Plavix, Cardura and Diltiazem yesterday which she did do and she states that her headache is back. She wants to know if any of these medications could be causing her headaches and if there is other medications she can take in place of them.   Please send response to triage.

## 2016-03-25 NOTE — Telephone Encounter (Signed)
Patient was admitted to hospital recently. Patient stated she had severe headaches when taking beta blocker but the hosptial took her off for 4 days and the headaches stopped. When she started the beta blocker again, the headaches came back. Patient would like to know if there is an alternative. Please call, thanks.

## 2016-03-26 ENCOUNTER — Ambulatory Visit (INDEPENDENT_AMBULATORY_CARE_PROVIDER_SITE_OTHER): Payer: Medicare Other | Admitting: Nurse Practitioner

## 2016-03-26 ENCOUNTER — Other Ambulatory Visit (INDEPENDENT_AMBULATORY_CARE_PROVIDER_SITE_OTHER): Payer: Medicare Other

## 2016-03-26 VITALS — BP 130/62 | HR 106 | Temp 98.6°F | Resp 16 | Wt 204.0 lb

## 2016-03-26 DIAGNOSIS — D649 Anemia, unspecified: Secondary | ICD-10-CM | POA: Diagnosis not present

## 2016-03-26 DIAGNOSIS — K219 Gastro-esophageal reflux disease without esophagitis: Secondary | ICD-10-CM | POA: Diagnosis not present

## 2016-03-26 DIAGNOSIS — K922 Gastrointestinal hemorrhage, unspecified: Secondary | ICD-10-CM | POA: Diagnosis not present

## 2016-03-26 DIAGNOSIS — E538 Deficiency of other specified B group vitamins: Secondary | ICD-10-CM | POA: Diagnosis not present

## 2016-03-26 DIAGNOSIS — I5032 Chronic diastolic (congestive) heart failure: Secondary | ICD-10-CM | POA: Diagnosis not present

## 2016-03-26 LAB — CBC WITH DIFFERENTIAL/PLATELET
Basophils Absolute: 0.1 10*3/uL (ref 0.0–0.1)
Basophils Relative: 1.1 % (ref 0.0–3.0)
EOS PCT: 3.2 % (ref 0.0–5.0)
Eosinophils Absolute: 0.2 10*3/uL (ref 0.0–0.7)
HEMATOCRIT: 31.9 % — AB (ref 36.0–46.0)
Hemoglobin: 10.4 g/dL — ABNORMAL LOW (ref 12.0–15.0)
LYMPHS ABS: 2 10*3/uL (ref 0.7–4.0)
Lymphocytes Relative: 29.2 % (ref 12.0–46.0)
MCHC: 32.6 g/dL (ref 30.0–36.0)
MCV: 80.3 fl (ref 78.0–100.0)
MONOS PCT: 8.9 % (ref 3.0–12.0)
Monocytes Absolute: 0.6 10*3/uL (ref 0.1–1.0)
NEUTROS ABS: 3.9 10*3/uL (ref 1.4–7.7)
NEUTROS PCT: 57.6 % (ref 43.0–77.0)
Platelets: 276 10*3/uL (ref 150.0–400.0)
RBC: 3.97 Mil/uL (ref 3.87–5.11)
RDW: 16.4 % — ABNORMAL HIGH (ref 11.5–15.5)
WBC: 6.7 10*3/uL (ref 4.0–10.5)

## 2016-03-26 NOTE — Progress Notes (Signed)
Subjective:  Patient ID: [name], female    DOB: [birth year]  Age: 76 y.o. MRN: [id number]  CC: Hospitalization Follow-up  HPI  Admitted: 03/18/16 Discharged: 03/20/16.  Ms Shiller was admitted and treated for lower GI bleed. She had 3episodes of BRBPR in absence of ABD pain, diarrhea or constipation or N/V or dizziness or Cp. During hospitalization, bleeding had resolved, hence no scope was done. She has hx of diverticulosis and internal hemorrhoids per colonoscopy done 09/2015. Plavix was held for 4days, then resumed. She was also placed on PPI BID.  No blood tranfusion. Labs monitored.  Reviewed labs and CT ABD/Pelvis done during hospital stay.  Medication list was reviewed and reconciled.  During office visit today, she denies any GI bleed, but has persistent RLQ ABD pain. No constipation or diarrhea, no N/V, no dizziness, no Cp.  Outpatient Medications Prior to Visit  Medication Sig Dispense Refill  . albuterol (PROVENTIL HFA;VENTOLIN HFA) 108 (90 BASE) MCG/ACT inhaler Inhale 2 puffs into the lungs every 6 (six) hours as needed. For shortness of breath. 1 Inhaler 0  . albuterol (PROVENTIL) (2.5 MG/3ML) 0.083% nebulizer solution Take 3 mLs (2.5 mg total) by nebulization every 6 (six) hours as needed. For shortness of breath. (Patient taking differently: Take 2.5 mg by nebulization every 6 (six) hours as needed for wheezing or shortness of breath. ) 360 mL 11  . ALPRAZolam (XANAX) 0.5 MG tablet Take 0.5 mg by mouth 2 (two) times daily as needed for anxiety.     . baclofen (LIORESAL) 20 MG tablet Take 20 mg by mouth daily.     . benzonatate (TESSALON) 100 MG capsule Take 100 mg by mouth 3 (three) times daily as needed for cough.    . Blood Glucose Monitoring Suppl (ONE TOUCH ULTRA MINI) w/Device KIT 3 (three) times daily. for testing  0  . Budesonide (PULMICORT FLEXHALER) 90 MCG/ACT inhaler Inhale 2 puffs into the lungs 2 (two) times daily. (Patient taking differently: Inhale 2  puffs into the lungs 2 (two) times daily as needed (for shortness of breath). ) 2 each 0  . budesonide (PULMICORT) 0.5 MG/2ML nebulizer solution Take 2 mLs (0.5 mg total) by nebulization 2 (two) times daily. (Patient taking differently: Take 0.5 mg by nebulization 2 (two) times daily as needed (for shortness of breath). ) 120 mL 11  . Calcium & Magnesium Carbonates (MYLANTA PO) Take 1 tablet by mouth daily as needed (for indigestion).     . Cholecalciferol (VITAMIN D) 2000 UNITS tablet Take 2,000 Units by mouth daily.     . Cyanocobalamin (VITAMIN B-12 IJ) Inject as directed every 30 (thirty) days.    . cyclobenzaprine (FLEXERIL) 5 MG tablet TAKE 1 TABLET BY MOUTH AT BEDTIME 30 tablet 3  . diclofenac sodium (VOLTAREN) 1 % GEL Apply 2 g topically every 4 (four) hours.    Marland Kitchen diltiazem (CARDIZEM CD) 240 MG 24 hr capsule Take 1 capsule (240 mg total) by mouth daily. 30 capsule 10  . doxazosin (CARDURA) 8 MG tablet Take 1 tablet (8 mg total) by mouth daily. 30 tablet 6  . fexofenadine (ALLEGRA) 180 MG tablet Take 1 tablet (180 mg total) by mouth daily. 30 tablet 6  . formoterol (PERFOROMIST) 20 MCG/2ML nebulizer solution Take 2 mLs (20 mcg total) by nebulization 2 (two) times daily. 60 mL 0  . furosemide (LASIX) 40 MG tablet Take 40 mg by mouth every other day.     . insulin lispro protamine-insulin lispro (HUMALOG 75/25) (  75-25) 100 UNIT/ML SUSP Inject 22-27 Units into the skin 2 (two) times daily with a meal. Takes 22 units in the morning and 27 units with supper    . isosorbide mononitrate (IMDUR) 60 MG 24 hr tablet Take 1 tablet (60 mg total) by mouth daily. 30 tablet 11  . loperamide (IMODIUM) 2 MG capsule Take 2 mg by mouth as needed for diarrhea or loose stools.     . meclizine (ANTIVERT) 25 MG tablet Take 25 mg by mouth 2 (two) times daily as needed for dizziness.     . methocarbamol (ROBAXIN) 500 MG tablet Take 500 mg by mouth every 8 (eight) hours as needed for muscle spasms.  1  . metoprolol  succinate (TOPROL-XL) 50 MG 24 hr tablet Take 1 tablet (50 mg total) by mouth daily. 30 tablet 10  . montelukast (SINGULAIR) 5 MG chewable tablet Chew 1 tablet (5 mg total) by mouth at bedtime. 30 tablet 3  . Multiple Vitamin (MULTIVITAMIN WITH MINERALS) TABS Take 1 tablet by mouth daily.    . Nebulizers (COMPRESSOR/NEBULIZER) MISC Use with albuterol 1 each 0  . nitroGLYCERIN (NITROSTAT) 0.4 MG SL tablet Place 0.4 mg under the tongue every 5 (five) minutes x 3 doses as needed for chest pain.     . ONE TOUCH ULTRA TEST test strip CHECK BLOOD SUGAR TWICE A DAY DX: E11.9  3  . ONETOUCH DELICA LANCETS 62X MISC 3 (three) times daily. for testing  0  . Pancrelipase, Lip-Prot-Amyl, 24000 units CPEP Take 2 capsules (48,000 Units total) by mouth 3 (three) times daily with meals. Take 1 capsule with a snack 210 capsule 6  . pantoprazole (PROTONIX) 40 MG tablet Take 1 tablet (40 mg total) by mouth 2 (two) times daily before a meal. (Patient taking differently: Take 40 mg by mouth daily. ) 60 tablet 11  . potassium chloride SA (K-DUR,KLOR-CON) 20 MEQ tablet Take 20 mEq by mouth 2 (two) times daily.    . traMADol (ULTRAM) 50 MG tablet Take 50 mg by mouth every 6 (six) hours as needed for moderate pain or severe pain.   0   Facility-Administered Medications Prior to Visit  Medication Dose Route Frequency Provider Last Rate Last Dose  . cyanocobalamin ((VITAMIN B-12)) injection 1,000 mcg  1,000 mcg Intramuscular Q30 days Binnie Rail, MD   1,000 mcg at 03/26/16 1658    ROS See HPI  Objective:  BP 130/62   Pulse (!) 106   Temp 98.6 F (37 C) (Oral)   Resp 16   Wt 204 lb (92.5 kg)   BMI 37.31 kg/m   BP Readings from Last 3 Encounters:  03/26/16 130/62  03/20/16 (!) 96/46  03/04/16 (!) 152/84    Wt Readings from Last 3 Encounters:  03/26/16 204 lb (92.5 kg)  03/20/16 205 lb 6.4 oz (93.2 kg)  03/04/16 206 lb 6.4 oz (93.6 kg)    Physical Exam  Constitutional: She is oriented to person, place,  and time. No distress.  Neck: Normal range of motion. Neck supple.  Cardiovascular: Normal rate, regular rhythm and normal heart sounds.   Pulmonary/Chest: Effort normal and breath sounds normal. No respiratory distress.  Abdominal: Soft. She exhibits no distension. There is tenderness.  RLQ tenderness  Musculoskeletal: Normal range of motion. She exhibits no edema.  Neurological: She is alert and oriented to person, place, and time.  Skin: Skin is warm and dry.  Psychiatric: She has a normal mood and affect. Her behavior is normal.  Lab Results  Component Value Date   WBC 6.7 03/26/2016   HGB 10.4 (L) 03/26/2016   HCT 31.9 (L) 03/26/2016   PLT 276.0 03/26/2016   GLUCOSE 112 (H) 03/19/2016   CHOL 161 10/06/2011   TRIG 109 10/06/2011   HDL 37 (L) 10/06/2011   LDLCALC 102 (H) 10/06/2011   ALT 13 (L) 03/18/2016   AST 16 03/18/2016   NA 142 03/19/2016   K 3.8 03/19/2016   CL 107 03/19/2016   CREATININE 0.56 03/19/2016   BUN 12 03/19/2016   CO2 29 03/19/2016   TSH 0.58 03/31/2015   INR 1.07 03/18/2016   HGBA1C 6.1 (H) 03/19/2016   MICROALBUR 0.9 04/22/2015    Ct Abdomen Pelvis W Contrast  Result Date: 03/18/2016 CLINICAL DATA:  RIGHT lower quadrant pain with rectal bleeding. EXAM: CT ABDOMEN AND PELVIS WITH CONTRAST TECHNIQUE: Multidetector CT imaging of the abdomen and pelvis was performed using the standard protocol following bolus administration of intravenous contrast. CONTRAST:  138m ISOVUE-300 IOPAMIDOL (ISOVUE-300) INJECTION 61% COMPARISON:  11/02/2012. FINDINGS: Lower chest: No acute abnormality.  Mitral annulus calcification. Hepatobiliary: No focal solid liver abnormality is seen. Status post cholecystectomy. No biliary dilatation. Tiny area of hypodensity in the dome of the liver on the RIGHT, statistically a simple cyst. Pancreas: Unremarkable. No pancreatic ductal dilatation or surrounding inflammatory changes. Spleen: Normal in size without focal mass. 1 cm cyst in  the upper portion of the spleen, slightly increased from priors. Adrenals/Urinary Tract: Adrenal glands are unremarkable. Kidneys are normal, without renal calculi, focal lesion, or hydronephrosis. Bladder is unremarkable. 3 x 4 simple cyst, exophytic from the anterior superior aspect of the LEFT kidney slightly larger from priors. Other tiny LEFT renal cysts and slightly greater than 1 cm RIGHT renal cyst demonstrate interval stability. 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 Stomach/Bowel: Stomach is within normal limits. No appendiceal inflammation. No evidence of bowel wall thickening, distention, or inflammatory changes. Moderate stool burden. 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 No enlarged lymph nodes. Reproductive: Status post hysterectomy. No adnexal masses. Other: No abdominal wall hernia or abnormality. No abdominopelvic ascites. Musculoskeletal: No acute or significant osseous findings. IMPRESSION: No acute intra-abdominal or pelvic findings to explain rectal bleeding. Constipation. Electronically Signed   By: JStaci RighterM.D.   On: 03/18/2016 16:25    Assessment & Plan:   EJaslenewas seen today for hospitalization follow-up.  Diagnoses and all orders for this visit:  Anemia, unspecified type -     CBC w/Diff; Future  Gastroesophageal reflux disease, esophagitis presence not specified  Chronic diastolic CHF (congestive heart failure) (HCC)  Lower GI bleed   I have discontinued Ms. Duskin's traMADol. I am also having her maintain her Vitamin D, nitroGLYCERIN, Calcium & Magnesium Carbonates (MYLANTA PO), insulin lispro protamine-lispro, multivitamin with minerals, albuterol, meclizine, baclofen, furosemide, potassium chloride SA, Compressor/Nebulizer, Cyanocobalamin (VITAMIN B-12 IJ), diltiazem, montelukast, albuterol, budesonide, ONE TOUCH ULTRA MINI, ONE TOUCH  ULTRA TEST, ONETOUCH DELICA LANCETS 327X loperamide, doxazosin, Pancrelipase (Lip-Prot-Amyl), fexofenadine, pantoprazole, benzonatate, ALPRAZolam, isosorbide mononitrate, Budesonide, formoterol, cyclobenzaprine, methocarbamol, diclofenac sodium, and metoprolol succinate. We administered cyanocobalamin. We will continue to administer cyanocobalamin.  No orders of the defined types were placed in this encounter.   Follow-up: No Follow-up on file.  CWilfred Lacy NP

## 2016-03-26 NOTE — Telephone Encounter (Signed)
LMTCB

## 2016-03-26 NOTE — Telephone Encounter (Signed)
It could be the cardura. Lets hold the cardura and see how she does She should check her BP regularly and keep a BP log

## 2016-03-26 NOTE — Progress Notes (Signed)
Pre visit review using our clinic review tool, if applicable. No additional management support is needed unless otherwise documented below in the visit note. 

## 2016-03-26 NOTE — Telephone Encounter (Signed)
I discussed Dr Elmarie Shiley recommendations with [name], [name] agreed with plan.  [name] will stop cardura today, take and record her BP regularly, call back in a week or so, sooner if problems, to let Dr Acie Fredrickson know how she is doing.

## 2016-03-26 NOTE — Telephone Encounter (Signed)
Follow up     Pt returning call to [facility].

## 2016-03-27 ENCOUNTER — Encounter: Payer: Self-pay | Admitting: Nurse Practitioner

## 2016-03-27 DIAGNOSIS — D649 Anemia, unspecified: Secondary | ICD-10-CM | POA: Insufficient documentation

## 2016-03-27 NOTE — Assessment & Plan Note (Signed)
Stable weight, BP and Hr. Medications reviewed

## 2016-03-27 NOTE — Assessment & Plan Note (Signed)
Resolved. Resumed plavix.

## 2016-03-27 NOTE — Assessment & Plan Note (Signed)
Stable. CBC Latest Ref Rng & Units 03/26/2016 03/20/2016 03/19/2016  WBC 4.0 - 10.5 K/uL 6.7 6.5 4.5  Hemoglobin 12.0 - 15.0 g/dL 10.4(L) 9.8(L) 9.6(L)  Hematocrit 36.0 - 46.0 % 31.9(L) 31.2(L) 29.6(L)  Platelets 150.0 - 400.0 K/uL 276.0 228 225

## 2016-04-02 ENCOUNTER — Other Ambulatory Visit: Payer: Self-pay | Admitting: Cardiovascular Disease

## 2016-04-02 NOTE — Telephone Encounter (Signed)
Patient has not called back with any BP readings.  Conversation: Medication Problem  (Newest Message First)  March 26, 2016  Katrine Coho, RN      10:18 AM  Note    I discussed Dr Elmarie Shiley recommendations with pt, pt agreed with plan.  Pt will stop cardura today, take and record her BP regularly, call back in a week or so, sooner if problems, to let Dr Acie Fredrickson know how she is doing.     Please advise. Thanks, MI

## 2016-04-06 NOTE — Telephone Encounter (Signed)
Spoke with patient to check on her BP readings since stopping Cardura.  She reports BP readings of approximately 96/67 mmHg each time she has checked it. She reports heart rate 68-72 bpm. She states she is not as dizzy as she was before but still has some dizziness and fatigue. She continues to take metoprolol, diltiazem, furosemide and imdur. She states her low blood pressure started when she was hospitalized in early February.  She is being followed by PCP for anemia. She requests an appointment with Dr. Acie Fredrickson. I have scheduled her for office visit on 3/1 with Dr. Acie Fredrickson. She thanked me for the call.

## 2016-04-08 ENCOUNTER — Encounter: Payer: Self-pay | Admitting: Cardiovascular Disease

## 2016-04-08 ENCOUNTER — Encounter (INDEPENDENT_AMBULATORY_CARE_PROVIDER_SITE_OTHER): Payer: Self-pay

## 2016-04-08 ENCOUNTER — Ambulatory Visit (INDEPENDENT_AMBULATORY_CARE_PROVIDER_SITE_OTHER): Payer: Medicare Other | Admitting: Cardiovascular Disease

## 2016-04-08 VITALS — BP 130/80 | HR 86 | Ht 62.0 in | Wt 208.2 lb

## 2016-04-08 DIAGNOSIS — I2510 Atherosclerotic heart disease of native coronary artery without angina pectoris: Secondary | ICD-10-CM

## 2016-04-08 NOTE — Progress Notes (Signed)
Cardiology Office Note   Date:  04/08/2016   ID:  [name], DOB 1940/03/30, MRN [id number]  PCP:  [name] Rail, MD  Cardiologist:   [name] Moores, MD   Chief Complaint  Patient presents with  . Coronary Artery Disease   1. Coronary artery disease-status post CABG, the saphenous vein graft to the right coronary artery is severely diseased but she has good flow to the distal right coronary artery from the native circulation. 2. Diabetes mellitus 3. Hypertension 4. Hyperlipidemia 5. COPD: [name], 2013 - FEV1 equals 0.89 L which is 48% of predicted value. Her DLCO is also markedly depressed. 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   [name] is a 76 y.o. female with the above noted hx. She continues to have episodes of sharp pain ( few seconds) followed by a burning pain ( last 6-7 minutes). She usually does not take NTG because it causes a headache. She was started on Lasix earlier this year. She seems to be doing much better on Lasix. She's had only function test which revealed severe obstructive lung disease with a good initial response to bronchodilators.  She is scheduled to have surgery all right knee. She's here today for preoperative evaluation prior to having the surgery.  September 04, 2012:  [name] is seen today for a follow up visit. She saw [name] in April and was started on bystolic. She has tolerated that fairly well. She has some episodes of CP that are constant. She is very stable.   03/01/2013:  She continues to have significant CP. Still short of breath. She has no energy. Just does not feel well in general.  She does not get much sleep - perhaps 3 hours a night.   Jan. 8, 2016:  [name] is a 76 yo who we follow for CAD, DM, HTN, hyperlipidemia, . She has COPD: FEV1 equals 0.89 L which is 48% of predicted value. Her DLCO is also markedly depressed. She has severe obstructive lung disease with a  beneficial response to bronchodilators.  Breathing is the same. Has some upper respiratory issues.  Still has some tightness.    Jun 17, 2014:    [name] is a 76 y.o. female who presents for episodes of CP    Started 1 1/2 months ago.  Has tried mylanta - thought it was gas.  Did not get any better Occurs wjith exertion or at rest. , left sided chest pain   No radiation Gets lightheaded. Has tried NTG which seems to help.    Nov. 15 2016:  Has not been feeling well for the past couple of months Has a chest pressure when she gets out of bed.  Occurs twice a day - in the AM when she gets up  And then again at 3:30 in the afternoon.   Is active between those times , makes home visit.   Does water aerobics without any CP / pressure.   Wears home O2 at night.     Jun 23, 2015: Doing the same.  Still has the same chest pain  Last cath was in 2014 - showed patent LIMA to LAD, SVG to D1, the SVG to RCA was occluded   Native RCA has a 40-50% prox stenosis   The pains occur typically twice a day - Upon awakening and around 7 PM.   Nov. 20, 2017:  Has had lots of fatigue / pressure in her chest .  Increased shortness of breath walking a short distance  Has COPD - had issues with bronchitis ,  Was on prednisone  Pain was relieved with NTG and also by taking additional aspirin   April 08, 2016:  She has had some GI bleeding from diverticulitis  Was in the hospital for 4 days  Has been seeing [name].    Past Medical History:  Diagnosis Date  . Acute myocardial infarction, unspecified site, episode of care unspecified   . Adenomatous colon polyp   . Anxiety   . Chest pain   . Coronary artery disease   . Diabetes mellitus   . GERD (gastroesophageal reflux disease)   . Hiatal hernia   . Hyperlipidemia   . Hypertension   . PONV (postoperative nausea and vomiting)   . Schatzki's ring   . Shoulder injury    resolved after shoulder surgery  . Unspecified  asthma(493.90)   . Upper respiratory symptom    02-13-14 "runny nose, mild cough,sniffles" -instructed to inform MD if symptoms worsens    Past Surgical History:  Procedure Laterality Date  . ABDOMINAL HYSTERECTOMY    . APPENDECTOMY     came out with Hysterectomy  . CARDIAC CATHETERIZATION  07/23/2009   EF 60%  . CARDIAC CATHETERIZATION  10/11/2008  . CARDIAC CATHETERIZATION  03/01/2007   EF 75-80%  . CARDIOVASCULAR STRESS TEST  11/15/2007   EF 60%  . COLONOSCOPY    . CORONARY ARTERY BYPASS GRAFT     SEVERELY DISEASED SAPHENOUS VEIN GRAFT TO THE RIGHT CORONARY ARTERY BUT WITH FAIRLY WELL PRESERVED FLOW TO THE DISTAL RIGHT CORONARY ARTERY FROM THE NATIVE CIRCULATION-RESTART  CATH IN [name] 2000, REVEALS MILD/MODERATE  CAD WITH GOOD FLOW DOWN HER LAD  . ESOPHAGEAL DILATION    . ESOPHAGOGASTRODUODENOSCOPY    . EYE SURGERY     bilateral cataract surgery with lens implant  . lense removal Left   . ROTATOR CUFF REPAIR     right and left  . TONSILLECTOMY     age 74  . TOTAL KNEE ARTHROPLASTY Right 02/20/2014   Procedure: RIGHT TOTAL KNEE ARTHROPLASTY;  Surgeon: [name] Bastos, MD;  Location: [facility];  Service: Orthopedics;  Laterality: Right;  . tumor removed kidney    . US ECHOCARDIOGRAPHY  03/08/2008   EF 55-60%     Current Outpatient Prescriptions  Medication Sig Dispense Refill  . albuterol (PROVENTIL HFA;VENTOLIN HFA) 108 (90 BASE) MCG/ACT inhaler Inhale 2 puffs into the lungs every 6 (six) hours as needed. For shortness of breath. 1 Inhaler 0  . albuterol (PROVENTIL) (2.5 MG/3ML) 0.083% nebulizer solution Take 3 mLs (2.5 mg total) by nebulization every 6 (six) hours as needed. For shortness of breath. (Patient taking differently: Take 2.5 mg by nebulization every 6 (six) hours as needed for wheezing or shortness of breath. ) 360 mL 11  . ALPRAZolam (XANAX) 0.5 MG tablet Take 0.5 mg by mouth 2 (two) times daily as needed for anxiety.     . baclofen (LIORESAL) 20 MG tablet Take 20 mg by  mouth daily.     . benzonatate (TESSALON) 100 MG capsule Take 100 mg by mouth 3 (three) times daily as needed for cough.    . Blood Glucose Monitoring Suppl (ONE TOUCH ULTRA MINI) w/Device KIT 3 (three) times daily. for testing  0  . Budesonide (PULMICORT FLEXHALER) 90 MCG/ACT inhaler Inhale 2 puffs into the lungs 2 (two) times daily. (Patient taking differently: Inhale 2 puffs into the lungs 2 (  two) times daily as needed (for shortness of breath). ) 2 each 0  . budesonide (PULMICORT) 0.5 MG/2ML nebulizer solution Take 2 mLs (0.5 mg total) by nebulization 2 (two) times daily. (Patient taking differently: Take 0.5 mg by nebulization 2 (two) times daily as needed (for shortness of breath). ) 120 mL 11  . Calcium & Magnesium Carbonates (MYLANTA PO) Take 1 tablet by mouth daily as needed (for indigestion).     . Cholecalciferol (VITAMIN D) 2000 UNITS tablet Take 2,000 Units by mouth daily.     . Cyanocobalamin (VITAMIN B-12 IJ) Inject as directed every 30 (thirty) days.    . cyclobenzaprine (FLEXERIL) 5 MG tablet TAKE 1 TABLET BY MOUTH AT BEDTIME 30 tablet 3  . diclofenac sodium (VOLTAREN) 1 % GEL Apply 2 g topically every 4 (four) hours.    Marland Kitchen diltiazem (CARDIZEM CD) 240 MG 24 hr capsule Take 1 capsule (240 mg total) by mouth daily. 30 capsule 10  . fexofenadine (ALLEGRA) 180 MG tablet Take 1 tablet (180 mg total) by mouth daily. 30 tablet 6  . formoterol (PERFOROMIST) 20 MCG/2ML nebulizer solution Take 2 mLs (20 mcg total) by nebulization 2 (two) times daily. 60 mL 0  . furosemide (LASIX) 40 MG tablet Take 40 mg by mouth every other day.     . insulin lispro protamine-insulin lispro (HUMALOG 75/25) (75-25) 100 UNIT/ML SUSP Inject 22-27 Units into the skin 2 (two) times daily with a meal. Takes 22 units in the morning and 27 units with supper    . loperamide (IMODIUM) 2 MG capsule Take 2 mg by mouth as needed for diarrhea or loose stools.     . meclizine (ANTIVERT) 25 MG tablet Take 25 mg by mouth 2  (two) times daily as needed for dizziness.     . methocarbamol (ROBAXIN) 500 MG tablet Take 500 mg by mouth every 8 (eight) hours as needed for muscle spasms.  1  . metoprolol succinate (TOPROL-XL) 50 MG 24 hr tablet Take 1 tablet (50 mg total) by mouth daily. 30 tablet 10  . montelukast (SINGULAIR) 5 MG chewable tablet Chew 1 tablet (5 mg total) by mouth at bedtime. 30 tablet 3  . Multiple Vitamin (MULTIVITAMIN WITH MINERALS) TABS Take 1 tablet by mouth daily.    . Nebulizers (COMPRESSOR/NEBULIZER) MISC Use with albuterol 1 each 0  . nitroGLYCERIN (NITROSTAT) 0.4 MG SL tablet Place 0.4 mg under the tongue every 5 (five) minutes x 3 doses as needed for chest pain.     . ONE TOUCH ULTRA TEST test strip CHECK BLOOD SUGAR TWICE A DAY DX: E11.9  3  . ONETOUCH DELICA LANCETS 17B MISC 3 (three) times daily. for testing  0  . Pancrelipase, Lip-Prot-Amyl, 24000 units CPEP Take 2 capsules (48,000 Units total) by mouth 3 (three) times daily with meals. Take 1 capsule with a snack 210 capsule 6  . pantoprazole (PROTONIX) 40 MG tablet Take 1 tablet (40 mg total) by mouth 2 (two) times daily before a meal. (Patient taking differently: Take 40 mg by mouth daily. ) 60 tablet 11  . potassium chloride SA (K-DUR,KLOR-CON) 20 MEQ tablet Take 20 mEq by mouth 2 (two) times daily.    . isosorbide mononitrate (IMDUR) 60 MG 24 hr tablet Take 1 tablet (60 mg total) by mouth daily. 30 tablet 11   Current Facility-Administered Medications  Medication Dose Route Frequency Provider Last Rate Last Dose  . cyanocobalamin ((VITAMIN B-12)) injection 1,000 mcg  1,000 mcg Intramuscular Q30 days  [name] Rail, MD   1,000 mcg at 03/26/16 1658    Allergies:   Amlodipine; Banana; Co q10 [coenzyme q10]; Codeine; Morphine; Statins; and Sulfonamide derivatives    Social History:  The patient  reports that she quit smoking about 40 years ago. Her smoking use included Cigarettes. She started smoking about 61 years ago. She has a 10.50  pack-year smoking history. She has never used smokeless tobacco. She reports that she does not drink alcohol or use drugs.   Family History:  The patient's family history includes Diabetes in her maternal grandfather and mother; Emphysema in her paternal aunt and paternal uncle; Esophageal cancer (age of onset: 67) in her brother; Heart attack in her father; Heart disease in her maternal grandfather; Heart failure in her maternal grandfather; Rheum arthritis in her sister; Stomach cancer in her father.    ROS:  Please see the history of present illness.    Review of Systems: Constitutional:  denies fever, chills, diaphoresis, appetite change and fatigue.  HEENT: denies photophobia, eye pain, redness, hearing loss, ear pain, congestion, sore throat, rhinorrhea, sneezing, neck pain, neck stiffness and tinnitus.  Respiratory: denies SOB, DOE, cough, chest tightness, and wheezing.  Cardiovascular: denies chest pain, palpitations and mild  leg swelling.  Gastrointestinal: denies nausea, vomiting, abdominal pain, diarrhea, constipation, blood in stool.  Genitourinary: denies dysuria, urgency, frequency, hematuria, flank pain and difficulty urinating.  Musculoskeletal: denies  myalgias, back pain, joint swelling, arthralgias and gait problem.   Skin: denies pallor, rash and wound.  Neurological: denies dizziness, seizures, syncope, weakness, light-headedness, numbness and headaches.   Hematological: denies adenopathy, easy bruising, personal or family bleeding history.  Psychiatric/ Behavioral: denies suicidal ideation, mood changes, confusion, nervousness, sleep disturbance and agitation.       All other systems are reviewed and negative.    PHYSICAL EXAM: VS:  BP 130/80 (BP Location: Left Arm, Patient Position: Sitting, Cuff Size: Normal)   Pulse 86   Ht '5\' 2"'  (1.575 m)   Wt 208 lb 3.2 oz (94.4 kg)   BMI 38.08 kg/m  , BMI Body mass index is 38.08 kg/m. GEN: Well nourished, well  developed, in no acute distress  HEENT: normal  Neck: no JVD, carotid bruits, or masses Cardiac: RRR; no murmurs, rubs, or gallops,no edema  Respiratory:  clear to auscultation bilaterally, normal work of breathing GI: soft, nontender, nondistended, + BS MS: no deformity or atrophy  Skin: warm and dry, no rash Neuro:  Strength and sensation are intact Psych: normal   EKG:  EKG is ordered today. The ekg ordered today demonstrates  NSR at 86.  RBBB.    Recent Labs: 03/18/2016: ALT 13 03/19/2016: BUN 12; Creatinine, Ser 0.56; Potassium 3.8; Sodium 142 03/26/2016: Hemoglobin 10.4; Platelets 276.0    Lipid Panel    Component Value Date/Time   CHOL 161 10/06/2011 0500   TRIG 109 10/06/2011 0500   HDL 37 (L) 10/06/2011 0500   CHOLHDL 4.4 10/06/2011 0500   VLDL 22 10/06/2011 0500   LDLCALC 102 (H) 10/06/2011 0500      Wt Readings from Last 3 Encounters:  04/08/16 208 lb 3.2 oz (94.4 kg)  03/26/16 204 lb (92.5 kg)  03/20/16 205 lb 6.4 oz (93.2 kg)      Other studies Reviewed: Additional studies/ records that were reviewed today include: . Review of the above records demonstrates:    ASSESSMENT AND [name]:  1. Shortness of breath:   Has had several weeks of diverticulitis.   She's  also had some GI bleeding and I suspect that she is anemic. I suspect her symptoms are due to this  She has had these same symptoms for years  I do not think that she needs any additional cardiac workup at this time.  1. Coronary artery disease-status post CABG, the saphenous vein graft to the right coronary artery is severely diseased but she has good flow to the distal right coronary artery from the native circulation. She presents with recurrent CP - different from her previous CP .   She has had these pains off and on since her CABG.    Imdur to 60 mg a day .  Will get a [employer] . She has known CAD with atresia of her LIMA to LAD   2. Diabetes mellitus 3. Hypertension - she is doing  well off the Cardura  4. Hyperlipidemia 5. COPD: [name], 2013 - FEV1 equals 0.89 L which is 48% of predicted value. Her DLCO is also markedly depressed. 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   Current medicines are reviewed at length with the patient today.  The patient does not have concerns regarding medicines.  The following changes have been made:  no change  Labs/ tests ordered today include:  No orders of the defined types were placed in this encounter.    Disposition:   FU with me in 6 months       [name] Moores, MD  04/08/2016 [facility] [place], [place], [place]  [address] Phone: [phone]; Fax: [phone]   [facility]  [address] [place], [place]  [address] [phone]    Fax [phone]

## 2016-04-08 NOTE — Patient Instructions (Signed)
Medication Instructions:  Your physician recommends that you continue on your current medications as directed. Please refer to the Current Medication list given to you today.   Labwork: None Ordered   Testing/Procedures: None Ordered   Follow-Up: Your physician wants you to follow-up in: 6 months with Dr. Nahser.  You will receive a reminder letter in the mail two months in advance. If you don't receive a letter, please call our office to schedule the follow-up appointment.   If you need a refill on your cardiac medications before your next appointment, please call your pharmacy.   Thank you for choosing [facility]! Marquice Uddin, RN [phone]    

## 2016-04-12 ENCOUNTER — Ambulatory Visit: Payer: Medicare Other | Admitting: Physician Assistant

## 2016-04-12 ENCOUNTER — Other Ambulatory Visit: Payer: Self-pay

## 2016-04-26 ENCOUNTER — Encounter: Payer: Self-pay | Admitting: Physician Assistant

## 2016-04-26 ENCOUNTER — Ambulatory Visit (INDEPENDENT_AMBULATORY_CARE_PROVIDER_SITE_OTHER): Payer: Medicare Other | Admitting: Physician Assistant

## 2016-04-26 VITALS — BP 134/78 | HR 80 | Ht 62.0 in | Wt 204.6 lb

## 2016-04-26 DIAGNOSIS — Z8719 Personal history of other diseases of the digestive system: Secondary | ICD-10-CM

## 2016-04-26 DIAGNOSIS — K219 Gastro-esophageal reflux disease without esophagitis: Secondary | ICD-10-CM | POA: Diagnosis not present

## 2016-04-26 MED ORDER — OMEPRAZOLE 40 MG PO CPDR: 40.0000 mg | DELAYED_RELEASE_CAPSULE | Freq: Two times a day (BID) | ORAL | 5 refills | Status: DC

## 2016-04-26 NOTE — Progress Notes (Signed)
Reviewed and agree with management plan.  Virgil Slinger T. Waylin Dorko, MD FACG 

## 2016-04-26 NOTE — Progress Notes (Signed)
Chief Complaint: Chronic abdominal pain, Constipation  HPI:   [name] is a 76 year old female with a past medical history of adenomatous colon polyps, anxiety, CAD, diabetes, GERD, hypertension, and others listed below, who presents to clinic today with a complaint of chronic abdominal pain and constipation.   The patient typically follows in clinic with Dr. Fuller Plan and was last seen 08/18/15 for diarrhea. At that time she was taking Imodium 4 times daily and WelChol once daily. She was having 4 loose bowel movements per day with occasional episodes of fecal incontinence. She had noticed worsening of her diarrhea at that time. Stool studies were ordered including a GI pathogen panel, fecal elastase, celiac studies and patient was scheduled for colonoscopy and started on the Font[place]ot diet. Stool studies returned negative. Patient's colonoscopy was completed 09/10/15 with biopsies which were negative.     Patient was admitted 03/18/16-03/20/16 for a lower GI bleed with 3 episodes of bright red blood per rectum with no abdominal pain, diarrhea or constipation. Bleeding resolved during hospitalization and it was thought this was likely diverticular in nature. Her Plavix was held for 4 days and resume. Last hemoglobin was drawn to/16/18 and improved to 10.4 from 9.8 on 03/20/16.     Today, the patient tells me that she has been doing well since being discharged from the hospital. She has been having regular solid daily bowel movements with the assistance of a fiber supplement. She denies any further bleeding but tells me that she is "very anxious", that she will have another episode of bleeding. She does question whether "what I had can lead to cancer". Patient tells me she has also been having a lot of reflux lately irregardless of taking her Pantoprazole 40 mg twice a day which was started in the hospital. She does tell me she has a lot of increased stress recently over the health of her daughter.   Patient  denies fever, chills, blood in her stool, melena, weight loss, fatigue, nausea, vomiting or abdominal pain.  Past Medical History:  Diagnosis Date  . Acute myocardial infarction, unspecified site, episode of care unspecified   . Adenomatous colon polyp   . Anxiety   . Chest pain   . Coronary artery disease   . Diabetes mellitus   . GERD (gastroesophageal reflux disease)   . Hiatal hernia   . Hyperlipidemia   . Hypertension   . PONV (postoperative nausea and vomiting)   . Schatzki's ring   . Shoulder injury    resolved after shoulder surgery  . Unspecified asthma(493.90)   . Upper respiratory symptom    02-13-14 "runny nose, mild cough,sniffles" -instructed to inform MD if symptoms worsens    Past Surgical History:  Procedure Laterality Date  . ABDOMINAL HYSTERECTOMY    . APPENDECTOMY     came out with Hysterectomy  . CARDIAC CATHETERIZATION  07/23/2009   EF 60%  . CARDIAC CATHETERIZATION  10/11/2008  . CARDIAC CATHETERIZATION  03/01/2007   EF 75-80%  . CARDIOVASCULAR STRESS TEST  11/15/2007   EF 60%  . COLONOSCOPY    . CORONARY ARTERY BYPASS GRAFT     SEVERELY DISEASED SAPHENOUS VEIN GRAFT TO THE RIGHT CORONARY ARTERY BUT WITH FAIRLY WELL PRESERVED FLOW TO THE DISTAL RIGHT CORONARY ARTERY FROM THE NATIVE CIRCULATION-RESTART  CATH IN JUNE 2000, REVEALS MILD/MODERATE  CAD WITH GOOD FLOW DOWN HER LAD  . ESOPHAGEAL DILATION    . ESOPHAGOGASTRODUODENOSCOPY    . EYE SURGERY     bilateral  cataract surgery with lens implant  . lense removal Left   . ROTATOR CUFF REPAIR     right and left  . TONSILLECTOMY     age 70  . 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 tumor removed kidney    . US ECHOCARDIOGRAPHY  03/08/2008   EF 55-60%    Current Outpatient Prescriptions  Medication Sig Dispense Refill  . albuterol (PROVENTIL HFA;VENTOLIN HFA) 108  (90 BASE) MCG/ACT inhaler Inhale 2 puffs into the lungs every 6 (six) hours as needed. For shortness of breath. 1 Inhaler 0  . albuterol (PROVENTIL) (2.5 MG/3ML) 0.083% nebulizer solution Take 3 mLs (2.5 mg total) by nebulization every 6 (six) hours as needed. For shortness of breath. (Patient taking differently: Take 2.5 mg by nebulization every 6 (six) hours as needed for wheezing or shortness of breath. ) 360 mL 11  . ALPRAZolam (XANAX) 0.5 MG tablet Take 0.5 mg by mouth 2 (two) times daily as needed for anxiety.     . baclofen (LIORESAL) 20 MG tablet Take 20 mg by mouth daily.     . benzonatate (TESSALON) 100 MG capsule Take 100 mg by mouth 3 (three) times daily as needed for cough.    . Blood Glucose Monitoring Suppl (ONE TOUCH ULTRA MINI) w/Device KIT 3 (three) times daily. for testing  0  . Budesonide (PULMICORT FLEXHALER) 90 MCG/ACT inhaler Inhale 2 puffs into the lungs 2 (two) times daily. (Patient taking differently: Inhale 2 puffs into the lungs 2 (two) times daily as needed (for shortness of breath). ) 2 each 0  . budesonide (PULMICORT) 0.5 MG/2ML nebulizer solution Take 2 mLs (0.5 mg total) by nebulization 2 (two) times daily. (Patient taking differently: Take 0.5 mg by nebulization 2 (two) times daily as needed (for shortness of breath). ) 120 mL 11  . Calcium & Magnesium Carbonates (MYLANTA PO) Take 1 tablet by mouth daily as needed (for indigestion).     . Cholecalciferol (VITAMIN D) 2000 UNITS tablet Take 2,000 Units by mouth daily.     . Cyanocobalamin (VITAMIN B-12 IJ) Inject as directed every 30 (thirty) days.    . cyclobenzaprine (FLEXERIL) 5 MG tablet TAKE 1 TABLET BY MOUTH AT BEDTIME 30 tablet 3  . diclofenac sodium (VOLTAREN) 1 % GEL Apply 2 g topically every 4 (four) hours.    Marland Kitchen diltiazem (CARDIZEM CD) 240 MG 24 hr capsule Take 1 capsule (240 mg total) by mouth daily. 30 capsule 10  . fexofenadine (ALLEGRA) 180 MG tablet Take 1 tablet (180 mg total) by mouth daily. 30 tablet 6    . formoterol (PERFOROMIST) 20 MCG/2ML nebulizer solution Take 2 mLs (20 mcg total) by nebulization 2 (two) times daily. 60 mL 0  . furosemide (LASIX) 40 MG tablet Take 40 mg by mouth every other day.     . insulin lispro protamine-insulin lispro (HUMALOG 75/25) (75-25) 100 UNIT/ML SUSP Inject 22-27 Units into the skin 2 (two) times daily with a meal. Takes 22 units in the morning and 27 units with supper    . isosorbide mononitrate (IMDUR) 60 MG 24 hr tablet Take 1 tablet (60 mg total) by mouth daily. 30 tablet 11  . loperamide (IMODIUM) 2 MG capsule Take 2 mg by mouth as needed for diarrhea or loose stools.     . meclizine (ANTIVERT) 25 MG tablet Take 25 mg  by mouth 2 (two) times daily as needed for dizziness.     . methocarbamol (ROBAXIN) 500 MG tablet Take 500 mg by mouth every 8 (eight) hours as needed for muscle spasms.  1  . metoprolol succinate (TOPROL-XL) 50 MG 24 hr tablet Take 1 tablet (50 mg total) by mouth daily. 30 tablet 10  . montelukast (SINGULAIR) 5 MG chewable tablet Chew 1 tablet (5 mg total) by mouth at bedtime. 30 tablet 3  . Multiple Vitamin (MULTIVITAMIN WITH MINERALS) TABS Take 1 tablet by mouth daily.    . Nebulizers (COMPRESSOR/NEBULIZER) MISC Use with albuterol 1 each 0  . nitroGLYCERIN (NITROSTAT) 0.4 MG SL tablet Place 0.4 mg under the tongue every 5 (five) minutes x 3 doses as needed for chest pain.     . ONE TOUCH ULTRA TEST test strip CHECK BLOOD SUGAR TWICE A DAY DX: E11.9  3  . ONETOUCH DELICA LANCETS 45W MISC 3 (three) times daily. for testing  0  . Pancrelipase, Lip-Prot-Amyl, 24000 units CPEP Take 2 capsules (48,000 Units total) by mouth 3 (three) times daily with meals. Take 1 capsule with a snack 210 capsule 6  . pantoprazole (PROTONIX) 40 MG tablet Take 1 tablet (40 mg total) by mouth 2 (two) times daily before a meal. (Patient taking differently: Take 40 mg by mouth daily. ) 60 tablet 11  . potassium chloride SA (K-DUR,KLOR-CON) 20 MEQ tablet Take 20 mEq by  mouth 2 (two) times daily.     Current Facility-Administered Medications  Medication Dose Route Frequency Provider Last Rate Last Dose  . cyanocobalamin ((VITAMIN B-12)) injection 1,000 mcg  1,000 mcg Intramuscular Q30 days Binnie Rail, MD   1,000 mcg at 03/26/16 1658    Allergies as of 04/26/2016 - Review Complete 04/08/2016  Allergen Reaction Noted  . Amlodipine Other (See Comments) 08/20/2010  . Banana Nausea And Vomiting 11/01/2011  . Co q10 [coenzyme q10]    . Codeine    . Morphine Other (See Comments)   . Statins  11/19/2015  . Sulfonamide derivatives Swelling     Family History  Problem Relation Age of Onset  . Heart disease Maternal Grandfather   . Heart failure Maternal Grandfather   . Diabetes Maternal Grandfather   . Heart attack Father   . Stomach cancer Father   . Diabetes Mother   . Rheum arthritis Sister   . Emphysema Paternal Uncle   . Esophageal cancer Brother 81    she said he was born with it  . Emphysema Paternal Aunt   . Neuropathy Neg Hx   . Multiple sclerosis Neg Hx   . Colon cancer Neg Hx     Social History   Social History  . Marital status: Widowed    Spouse name: N/A  . Number of children: 4  . Years of education: Doctorate   Occupational History  . Retired    Social History Main Topics  . Smoking status: Former Smoker    Packs/day: 0.50    Years: 21.00    Types: Cigarettes    Start date: 02/09/1955    Quit date: 02/09/1976  . Smokeless tobacco: Never Used  . Alcohol use No  . Drug use: No  . Sexual activity: Not on file   Other Topics Concern  . Not on file   Social History Narrative   Lives alone.   Caffeine use: Drinks 1 cup coffee/day      Originally from [place]. Previously has lived in [place]. Prior travel  to [place], [place], [place], [place], [place], [place], [place], & [place]. Previously worked in [employer] She has a dog currently. No bird, mold, or hot tub exposure. She also pastors a church.     Review of Systems:      Constitutional: No weight loss, fever, chills, weakness or fatigue Cardiovascular: No chest pain Respiratory: No SOB Gastrointestinal: See HPI and otherwise negative   Physical Exam:  Vital signs: BP 134/78   Pulse 80   Ht _0  (1.575 m)   Wt 204 lb 9.6 oz (92.8 kg)   BMI 37.42 kg/m    Constitutional:   Pleasant African American female appears to be in NAD, Well developed, Well nourished, alert and cooperative Respiratory: Respirations even and unlabored. Lungs clear to auscultation bilaterally.   No wheezes, crackles, or rhonchi.  Cardiovascular: Normal S1, S2. No MRG. Regular rate and rhythm. No peripheral edema, cyanosis or pallor.  Gastrointestinal:  Soft, nondistended, nontender. No rebound or guarding. Normal bowel sounds. No appreciable masses or hepatomegaly. Psychiatric:  Demonstrates good judgement and reason without abnormal affect or behaviors.  RELEVANT LABS AND IMAGING: CBC    Component Value Date/Time   WBC 6.7 03/26/2016 1700   RBC 3.97 03/26/2016 1700   HGB 10.4 (L) 03/26/2016 1700   HCT 31.9 (L) 03/26/2016 1700   PLT 276.0 03/26/2016 1700   MCV 80.3 03/26/2016 1700   MCH 25.8 (L) 03/20/2016 0538   MCHC 32.6 03/26/2016 1700   RDW 16.4 (H) 03/26/2016 1700   LYMPHSABS 2.0 03/26/2016 1700   MONOABS 0.6 03/26/2016 1700   EOSABS 0.2 03/26/2016 1700   BASOSABS 0.1 03/26/2016 1700    CMP     Component Value Date/Time   NA 142 03/19/2016 0603   K 3.8 03/19/2016 0603   CL 107 03/19/2016 0603   CO2 29 03/19/2016 0603   GLUCOSE 112 (H) 03/19/2016 0603   BUN 12 03/19/2016 0603   CREATININE 0.56 03/19/2016 0603   CALCIUM 8.4 (L) 03/19/2016 0603   PROT 6.7 03/18/2016 1218   ALBUMIN 3.9 03/18/2016 1218   AST 16 03/18/2016 1218   ALT 13 (L) 03/18/2016 1218   ALKPHOS 109 03/18/2016 1218   BILITOT 0.1 (L) 03/18/2016 1218   GFRNONAA >60 03/19/2016 0603   GFRAA >60 03/19/2016 0603    Assessment: 1. History of diverticular bleed: Recently seen in the  hospital for this in february, no bleeding since, hemoglobin stable 2. GERD: Patient reports increase in symptoms irregardless of Pantoprazole 40 mg twice a day, does have increase of stress in her life recently question whether this is partially related to functional dyspepsia  Plan: 1. Changed patient from Pantoprazole 40 mg twice a day to Omeprazole 40 mg twice a day. Did discuss with patient that if this is too expensive for her she should let us know and we can try another PPI. 2. Reviewed antireflux diet and lifestyle modifications. Did provide a handout. 3. Encouraged the patient not to be anxious regarding her daily bowel movements, she may never have a bleed again and if she does, she knows what to do now 4. Patient to follow in clinic as needed in the future with Dr. Fuller Plan or myself  Ellouise Newer, PA-C [facility] 04/26/2016, 10:30 AM  Cc: Binnie Rail, MD

## 2016-04-26 NOTE — Patient Instructions (Signed)
We have sent the following medications to your pharmacy for you to pick up at your convenience: Omeprazole 40 mg twice a day  Stop Pantoprazole

## 2016-05-19 ENCOUNTER — Other Ambulatory Visit (INDEPENDENT_AMBULATORY_CARE_PROVIDER_SITE_OTHER): Payer: Medicare Other

## 2016-05-19 ENCOUNTER — Ambulatory Visit (INDEPENDENT_AMBULATORY_CARE_PROVIDER_SITE_OTHER): Payer: Medicare Other | Admitting: Internal Medicine

## 2016-05-19 ENCOUNTER — Encounter: Payer: Self-pay | Admitting: Internal Medicine

## 2016-05-19 VITALS — BP 122/58 | HR 68 | Temp 98.1°F | Resp 16 | Wt 205.0 lb

## 2016-05-19 DIAGNOSIS — E538 Deficiency of other specified B group vitamins: Secondary | ICD-10-CM

## 2016-05-19 DIAGNOSIS — I2510 Atherosclerotic heart disease of native coronary artery without angina pectoris: Secondary | ICD-10-CM

## 2016-05-19 DIAGNOSIS — I10 Essential (primary) hypertension: Secondary | ICD-10-CM

## 2016-05-19 DIAGNOSIS — R5383 Other fatigue: Secondary | ICD-10-CM | POA: Diagnosis not present

## 2016-05-19 DIAGNOSIS — K922 Gastrointestinal hemorrhage, unspecified: Secondary | ICD-10-CM

## 2016-05-19 DIAGNOSIS — K219 Gastro-esophageal reflux disease without esophagitis: Secondary | ICD-10-CM

## 2016-05-19 DIAGNOSIS — G4733 Obstructive sleep apnea (adult) (pediatric): Secondary | ICD-10-CM

## 2016-05-19 DIAGNOSIS — E109 Type 1 diabetes mellitus without complications: Secondary | ICD-10-CM | POA: Diagnosis not present

## 2016-05-19 DIAGNOSIS — I779 Disorder of arteries and arterioles, unspecified: Secondary | ICD-10-CM | POA: Insufficient documentation

## 2016-05-19 DIAGNOSIS — I739 Peripheral vascular disease, unspecified: Secondary | ICD-10-CM

## 2016-05-19 LAB — TSH: TSH: 1.25 u[IU]/mL (ref 0.35–4.50)

## 2016-05-19 LAB — COMPREHENSIVE METABOLIC PANEL
ALT: 11 U/L (ref 0–35)
AST: 14 U/L (ref 0–37)
Albumin: 4.2 g/dL (ref 3.5–5.2)
Alkaline Phosphatase: 131 U/L — ABNORMAL HIGH (ref 39–117)
BUN: 20 mg/dL (ref 6–23)
CALCIUM: 9.2 mg/dL (ref 8.4–10.5)
CHLORIDE: 101 meq/L (ref 96–112)
CO2: 32 meq/L (ref 19–32)
Creatinine, Ser: 0.69 mg/dL (ref 0.40–1.20)
GFR: 105.92 mL/min (ref >60.00)
GLUCOSE: 178 mg/dL — AB (ref 70–99)
POTASSIUM: 4.4 meq/L (ref 3.5–5.1)
Sodium: 139 mEq/L (ref 135–145)
Total Bilirubin: 0.4 mg/dL (ref 0.2–1.2)
Total Protein: 7.4 g/dL (ref 6.0–8.3)

## 2016-05-19 LAB — CBC WITH DIFFERENTIAL/PLATELET
Basophils Absolute: 0.1 10*3/uL (ref 0.0–0.1)
Basophils Relative: 1.2 % (ref 0.0–3.0)
EOS ABS: 0.1 10*3/uL (ref 0.0–0.7)
Eosinophils Relative: 2.4 % (ref 0.0–5.0)
HCT: 37.7 % (ref 36.0–46.0)
HEMOGLOBIN: 12 g/dL (ref 12.0–15.0)
LYMPHS ABS: 1.3 10*3/uL (ref 0.7–4.0)
Lymphocytes Relative: 21.3 % (ref 12.0–46.0)
MCHC: 31.8 g/dL (ref 30.0–36.0)
MCV: 77.2 fl — ABNORMAL LOW (ref 78.0–100.0)
MONO ABS: 0.6 10*3/uL (ref 0.1–1.0)
Monocytes Relative: 10.5 % (ref 3.0–12.0)
NEUTROS PCT: 64.6 % (ref 43.0–77.0)
Neutro Abs: 3.8 10*3/uL (ref 1.4–7.7)
Platelets: 276 10*3/uL (ref 150.0–400.0)
RBC: 4.88 Mil/uL (ref 3.87–5.11)
RDW: 15.6 % — ABNORMAL HIGH (ref 11.5–15.5)
WBC: 6 10*3/uL (ref 4.0–10.5)

## 2016-05-19 LAB — MICROALBUMIN / CREATININE URINE RATIO
CREATININE, U: 145.7 mg/dL
Microalb Creat Ratio: 0.7 mg/g (ref 0.0–30.0)
Microalb, Ur: 1 mg/dL (ref 0.0–1.9)

## 2016-05-19 LAB — HEMOGLOBIN A1C: HEMOGLOBIN A1C: 7.3 % — AB (ref 4.6–6.5)

## 2016-05-19 LAB — IRON: Iron: 32 ug/dL — ABNORMAL LOW (ref 42–145)

## 2016-05-19 LAB — VITAMIN B12: Vitamin B-12: >1500 pg/mL — ABNORMAL HIGH (ref 211–911)

## 2016-05-19 LAB — FERRITIN: FERRITIN: 17.6 ng/mL (ref 10.0–291.0)

## 2016-05-19 MED ORDER — CYANOCOBALAMIN 1000 MCG/ML IJ SOLN
1000.0000 ug | Freq: Once | INTRAMUSCULAR | Status: AC
  Administered 2016-05-19: 1000 ug via INTRAMUSCULAR

## 2016-05-19 NOTE — Assessment & Plan Note (Addendum)
There was a controversy regarding how much she was using the machine, so her insurance took the machine ? Contributing to fatigue and headaches Advised her to call insurance company and see what she can do to get another machine f/u with neuro

## 2016-05-19 NOTE — Assessment & Plan Note (Signed)
Denies chest pain, but has palps, SOB Following with cardiology Continue current medications Increase exercise

## 2016-05-19 NOTE — Assessment & Plan Note (Signed)
Management per Dr. Balan 

## 2016-05-19 NOTE — Patient Instructions (Addendum)
  Test(s) ordered today. Your results will be released to MyChart (or called to you) after review, usually within 72hours after test completion. If any changes need to be made, you will be notified at that same time.  No immunizations administered today.   Medications reviewed and updated.  No changes recommended at this time.  Please followup in 6 months   

## 2016-05-19 NOTE — Progress Notes (Signed)
Subjective:    Patient ID: [name], female    DOB: 1940/06/19, 76 y.o.   MRN: [id number]  HPI The patient is here for follow up.  Fatigue:  She has significant fatigue.  She had a lower GI bleed in February and feels like her energy level never improved.  She denies blood in stool or black stool.    CAD, h/o MI, chronic diastolic heart failure, Hypertension: She is taking her medication daily. She is compliant with a low sodium diet . She is not exercising regularly, but plans on restarting.    Type 1 diabetes:  She is following with Dr Chalmers Cater.  She is taking her insulin as prescribed.    GERD:  She is taking her medication daily as prescribed.  She denies any GERD symptoms and feels her GERD is well controlled.   OSA:  She follows with neurology.  She was using her machine, but the insurance company did not feel she was using it enough and took it away.  She has daily headaches.  She feels fatigued.    Medications and allergies reviewed with patient and updated if appropriate.  Patient Active Problem List   Diagnosis Date Noted  . Carotid artery disease ([place]) 05/19/2016  . Anemia 03/27/2016  . Lower GI bleed 03/18/2016  . Severe persistent asthma 03/18/2016  . Type 1 diabetes mellitus ([place]) 07/21/2015  . Vitamin B12 deficiency 03/31/2015  . OSA (obstructive sleep apnea) 11/20/2014  . Fecal incontinence 08/28/2014  . 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 Falls 08/28/2014  . Neck pain of over 3 months duration 08/28/2014  . H/O total knee replacement 02/20/2014  . Obesity (BMI 30-39.9) 11/12/2013  . Chronic diastolic CHF (congestive heart failure) ([place]) 11/17/2012  . Meniscus, lateral, anterior horn derangement 11/10/2011  . Medial meniscus, posterior horn derangement 11/10/2011  . Osteoarthritis of right knee 11/10/2011  . Vertigo 10/05/2011  . HTN (hypertension), benign 10/05/2011  . Coronary artery disease 08/27/2010  . MUSCLE CRAMPS 12/29/2009  . HEART ATTACK  11/19/2009  . Extrinsic asthma 11/19/2009  . GERD 11/19/2009  . Hyperlipidemia 11/18/2009    Current Outpatient Prescriptions on File Prior to Visit  Medication Sig Dispense Refill  . albuterol (PROVENTIL HFA;VENTOLIN HFA) 108 (90 BASE) MCG/ACT inhaler Inhale 2 puffs into the lungs every 6 (six) hours as needed. For shortness of breath. 1 Inhaler 0  . albuterol (PROVENTIL) (2.5 MG/3ML) 0.083% nebulizer solution Take 3 mLs (2.5 mg total) by nebulization every 6 (six) hours as needed. For shortness of breath. (Patient taking differently: Take 2.5 mg by nebulization every 6 (six) hours as needed for wheezing or shortness of breath. ) 360 mL 11  . ALPRAZolam (XANAX) 0.5 MG tablet Take 0.5 mg by mouth 2 (two) times daily as needed for anxiety.     . baclofen (LIORESAL) 20 MG tablet Take 20 mg by mouth daily.     . benzonatate (TESSALON) 100 MG capsule Take 100 mg by mouth 3 (three) times daily as needed for cough.    . Blood Glucose Monitoring Suppl (ONE TOUCH ULTRA MINI) w/Device KIT 3 (three) times daily. for testing  0  . Budesonide (PULMICORT FLEXHALER) 90 MCG/ACT inhaler Inhale 2 puffs into the lungs 2 (two) times daily. (Patient taking differently: Inhale 2 puffs into the lungs 2 (two) times daily as needed (for shortness of breath). ) 2 each 0  . budesonide (PULMICORT) 0.5 MG/2ML nebulizer solution Take 2 mLs (0.5 mg total) by nebulization 2 (  two) times daily. (Patient taking differently: Take 0.5 mg by nebulization 2 (two) times daily as needed (for shortness of breath). ) 120 mL 11  . Calcium & Magnesium Carbonates (MYLANTA PO) Take 1 tablet by mouth daily as needed (for indigestion).     . Cholecalciferol (VITAMIN D) 2000 UNITS tablet Take 2,000 Units by mouth daily.     . Cyanocobalamin (VITAMIN B-12 IJ) Inject as directed every 30 (thirty) days.    . cyclobenzaprine (FLEXERIL) 5 MG tablet TAKE 1 TABLET BY MOUTH AT BEDTIME 30 tablet 3  . diclofenac sodium (VOLTAREN) 1 % GEL Apply 2 g  topically every 4 (four) hours.    Marland Kitchen diltiazem (CARDIZEM CD) 240 MG 24 hr capsule Take 1 capsule (240 mg total) by mouth daily. 30 capsule 10  . fexofenadine (ALLEGRA) 180 MG tablet Take 1 tablet (180 mg total) by mouth daily. 30 tablet 6  . formoterol (PERFOROMIST) 20 MCG/2ML nebulizer solution Take 2 mLs (20 mcg total) by nebulization 2 (two) times daily. 60 mL 0  . furosemide (LASIX) 40 MG tablet Take 40 mg by mouth every other day.     . insulin lispro protamine-insulin lispro (HUMALOG 75/25) (75-25) 100 UNIT/ML SUSP Inject 22-27 Units into the skin 2 (two) times daily with a meal. Takes 22 units in the morning and 27 units with supper    . loperamide (IMODIUM) 2 MG capsule Take 2 mg by mouth as needed for diarrhea or loose stools.     . meclizine (ANTIVERT) 25 MG tablet Take 25 mg by mouth 2 (two) times daily as needed for dizziness.     . methocarbamol (ROBAXIN) 500 MG tablet Take 500 mg by mouth every 8 (eight) hours as needed for muscle spasms.  1  . metoprolol succinate (TOPROL-XL) 50 MG 24 hr tablet Take 1 tablet (50 mg total) by mouth daily. 30 tablet 10  . montelukast (SINGULAIR) 5 MG chewable tablet Chew 1 tablet (5 mg total) by mouth at bedtime. 30 tablet 3  . Multiple Vitamin (MULTIVITAMIN WITH MINERALS) TABS Take 1 tablet by mouth daily.    . Nebulizers (COMPRESSOR/NEBULIZER) MISC Use with albuterol 1 each 0  . nitroGLYCERIN (NITROSTAT) 0.4 MG SL tablet Place 0.4 mg under the tongue every 5 (five) minutes x 3 doses as needed for chest pain.     Marland Kitchen omeprazole (PRILOSEC) 40 MG capsule Take 1 capsule (40 mg total) by mouth 2 (two) times daily. 60 capsule 5  . ONE TOUCH ULTRA TEST test strip CHECK BLOOD SUGAR TWICE A DAY DX: E11.9  3  . ONETOUCH DELICA LANCETS 54Y MISC 3 (three) times daily. for testing  0  . Pancrelipase, Lip-Prot-Amyl, 24000 units CPEP Take 2 capsules (48,000 Units total) by mouth 3 (three) times daily with meals. Take 1 capsule with a snack 210 capsule 6  .  pantoprazole (PROTONIX) 40 MG tablet Take 1 tablet (40 mg total) by mouth 2 (two) times daily before a meal. (Patient taking differently: Take 40 mg by mouth daily. ) 60 tablet 11  . potassium chloride SA (K-DUR,KLOR-CON) 20 MEQ tablet Take 20 mEq by mouth 2 (two) times daily.    . isosorbide mononitrate (IMDUR) 60 MG 24 hr tablet Take 1 tablet (60 mg total) by mouth daily. 30 tablet 11   Current Facility-Administered Medications on File Prior to Visit  Medication Dose Route Frequency Provider Last Rate Last Dose  . cyanocobalamin ((VITAMIN B-12)) injection 1,000 mcg  1,000 mcg Intramuscular Q30 days Claudina Lick  Reka Wist, MD   1,000 mcg at 03/26/16 1658    Past Medical History:  Diagnosis Date  . Acute myocardial infarction, unspecified site, episode of care unspecified   . Adenomatous colon polyp   . Anxiety   . Chest pain   . Coronary artery disease   . Diabetes mellitus   . GERD (gastroesophageal reflux disease)   . Hiatal hernia   . Hyperlipidemia   . Hypertension   . PONV (postoperative nausea and vomiting)   . Schatzki's ring   . Shoulder injury    resolved after shoulder surgery  . Unspecified asthma(493.90)   . Upper respiratory symptom    02-13-14 "runny nose, mild cough,sniffles" -instructed to inform MD if symptoms worsens    Past Surgical History:  Procedure Laterality Date  . ABDOMINAL HYSTERECTOMY    . APPENDECTOMY     came out with Hysterectomy  . CARDIAC CATHETERIZATION  07/23/2009   EF 60%  . CARDIAC CATHETERIZATION  10/11/2008  . CARDIAC CATHETERIZATION  03/01/2007   EF 75-80%  . CARDIOVASCULAR STRESS TEST  11/15/2007   EF 60%  . COLONOSCOPY    . CORONARY ARTERY BYPASS GRAFT     SEVERELY DISEASED SAPHENOUS VEIN GRAFT TO THE RIGHT CORONARY ARTERY BUT WITH FAIRLY WELL PRESERVED FLOW TO THE DISTAL RIGHT CORONARY ARTERY FROM THE NATIVE CIRCULATION-RESTART  CATH IN JUNE 2000, REVEALS MILD/MODERATE  CAD WITH GOOD FLOW DOWN HER LAD  . ESOPHAGEAL DILATION    .  ESOPHAGOGASTRODUODENOSCOPY    . EYE SURGERY     bilateral cataract surgery with lens implant  . lense removal Left   . ROTATOR CUFF REPAIR     right and left  . TONSILLECTOMY     age 37  . 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 tumor removed kidney    . US ECHOCARDIOGRAPHY  03/08/2008   EF 55-60%    Social History   Social History  . Marital status: Widowed    Spouse name: N/A  . Number of children: 4  . Years of education: Doctorate   Occupational History  . Retired    Social History Main Topics  . Smoking status: Former Smoker    Packs/day: 0.50    Years: 21.00    Types: Cigarettes    Start date: 02/09/1955    Quit date: 02/09/1976  . Smokeless tobacco: Never Used  . Alcohol use No  . Drug use: No  . Sexual activity: Not on file   Other Topics Concern  . Not on file   Social History Narrative   Lives alone.   Caffeine use: Drinks 1 cup coffee/day      Originally from [place]. Previously has lived in [place]. Prior travel to [place], [place], [place], [place], [place], [place], [place], & [place]. Previously worked in [employer] She has a dog currently. No bird, mold, or hot tub exposure. She also pastors a church.     Family History  Problem Relation Age of Onset  . Heart disease Maternal Grandfather   . Heart failure Maternal Grandfather   . Diabetes Maternal Grandfather   . Heart attack Father   . Stomach cancer Father   . Diabetes Mother   . Rheum arthritis Sister   . Emphysema Paternal Uncle   . Esophageal cancer Brother 36    she said he was born with it  .  Emphysema Paternal Aunt   . Neuropathy Neg Hx   . Multiple sclerosis Neg Hx   . Colon cancer Neg Hx     Review of Systems  Constitutional: Positive for fatigue. Negative for chills and fever.  Respiratory: Positive for chest tightness, shortness of breath (at rest  and activity) and wheezing. Negative for cough.   Cardiovascular: Positive for palpitations (if walks fast) and leg swelling (feet only). Negative for chest pain (tightness).  Neurological: Positive for dizziness (meclizine prn) and headaches (daily).       Objective:   Vitals:   05/19/16 1056  BP: (!) 122/58  Pulse: 68  Resp: 16  Temp: 98.1 F (36.7 C)   Wt Readings from Last 3 Encounters:  05/19/16 205 lb (93 kg)  04/26/16 204 lb 9.6 oz (92.8 kg)  04/08/16 208 lb 3.2 oz (94.4 kg)   Body mass index is 37.49 kg/m.   Physical Exam    Constitutional: Appears well-developed and well-nourished. No distress.  HENT:  Head: Normocephalic and atraumatic.  Neck: Neck supple. No tracheal deviation present. No thyromegaly present.  No cervical lymphadenopathy Cardiovascular: Normal rate, regular rhythm and normal heart sounds.   No murmur heard. No carotid bruit .  No edema Pulmonary/Chest: Effort normal and breath sounds normal. No respiratory distress. No has no wheezes. No rales.  Skin: Skin is warm and dry. Not diaphoretic.  Psychiatric: Normal mood and affect. Behavior is normal.      Assessment & Plan:    See Problem List for Assessment and Plan of chronic medical problems.

## 2016-05-19 NOTE — Assessment & Plan Note (Signed)
GERD controlled Continue daily medication  

## 2016-05-19 NOTE — Assessment & Plan Note (Addendum)
Check cbc, iron levels, tsh, cmp Likely multifactorial OSA likely contributing encouraged increased exercise

## 2016-05-19 NOTE — Progress Notes (Signed)
Pre visit review using our clinic review tool, if applicable. No additional management support is needed unless otherwise documented below in the visit note. 

## 2016-05-19 NOTE — Assessment & Plan Note (Signed)
BP well controlled Current regimen effective and well tolerated Continue current medications at current doses  

## 2016-05-19 NOTE — Assessment & Plan Note (Addendum)
B12 injection today Will check level  - but will not be accurate given injection prior Does not want to try oral B12  Continue monthly B12 injections

## 2016-05-19 NOTE — Assessment & Plan Note (Signed)
Recheck cbc No active bleeding symptoms

## 2016-06-16 ENCOUNTER — Telehealth: Payer: Self-pay | Admitting: Cardiovascular Disease

## 2016-06-16 NOTE — Telephone Encounter (Signed)
Call transferred into triage for patient reporting BLE swelling x 2 weeks.    Started with just RLE (foot/leg) and is now bilateral.   She is SOB with exertion, which she feels is different than her asthma SOB.  It is hard for her to know for sure as this time of year is difficulty with the pollen.   She is noticing SOB with walking to her car, mailbox, up steps.    She already sleeps sitting up because she has sleep apnea and no longer has CPAP.   Reports her diet is unchanged and that she is eating only vegetables and fish.  Offered appointment tomorrow with APP but patient already has another appointment tomorrow and is unsure of the time.   Scheduled her on Monday with Cecilie Kicks, APP.  Pt is agreeable with this time.

## 2016-06-16 NOTE — Telephone Encounter (Signed)
New Message  Pt c/o swelling: STAT is pt has developed SOB within 24 hours  1. How long have you been experiencing swelling? Two weeks  2. Where is the swelling located? Feet and Legs  3.  Are you currently taking a "fluid pill"? Yes  4.  Are you currently SOB? Yes within the last 24 hrs  5.  Have you traveled recently? No    Pt voiced having swelling in feet and legs

## 2016-06-17 ENCOUNTER — Encounter: Payer: Self-pay | Admitting: Cardiology

## 2016-06-18 ENCOUNTER — Other Ambulatory Visit: Payer: Self-pay | Admitting: Cardiovascular Disease

## 2016-06-21 ENCOUNTER — Encounter: Payer: Self-pay | Admitting: Cardiology

## 2016-06-21 ENCOUNTER — Ambulatory Visit (INDEPENDENT_AMBULATORY_CARE_PROVIDER_SITE_OTHER): Payer: Medicare Other | Admitting: Cardiology

## 2016-06-21 VITALS — BP 111/64 | HR 78 | Ht 62.0 in | Wt 207.8 lb

## 2016-06-21 DIAGNOSIS — R0602 Shortness of breath: Secondary | ICD-10-CM

## 2016-06-21 DIAGNOSIS — I10 Essential (primary) hypertension: Secondary | ICD-10-CM

## 2016-06-21 DIAGNOSIS — E1022 Type 1 diabetes mellitus with diabetic chronic kidney disease: Secondary | ICD-10-CM | POA: Diagnosis not present

## 2016-06-21 DIAGNOSIS — E782 Mixed hyperlipidemia: Secondary | ICD-10-CM | POA: Diagnosis not present

## 2016-06-21 DIAGNOSIS — D508 Other iron deficiency anemias: Secondary | ICD-10-CM

## 2016-06-21 DIAGNOSIS — I25810 Atherosclerosis of coronary artery bypass graft(s) without angina pectoris: Secondary | ICD-10-CM | POA: Diagnosis not present

## 2016-06-21 DIAGNOSIS — I6523 Occlusion and stenosis of bilateral carotid arteries: Secondary | ICD-10-CM | POA: Diagnosis not present

## 2016-06-21 DIAGNOSIS — I2510 Atherosclerotic heart disease of native coronary artery without angina pectoris: Secondary | ICD-10-CM

## 2016-06-21 NOTE — Progress Notes (Signed)
Cardiology Office Note   Date:  06/21/2016   ID:  TAHIRY SPICER, DOB 1941/01/17, MRN [id number]  PCP:  Binnie Rail, [place]  Cardiologist:  Dr. Acie Fredrickson    Chief Complaint  Patient presents with  . Chest Pain    DOE   . Shortness of Breath      History of Present Illness: [name] is a 76 y.o. female who presents for bil. Lower exxt edema, SOB and chest pain.   Hx of sleep apnea but no longer with CPAP so sleeps sitting up.  She has a hx of CAD post CABG last cath 04/2012 with VG to RCA occluded, LIMA to LAD small but widely patent.There is a slight stenosis at the anastomosis but it does not appear to be obstructive.  VG to 1st diag with normal graft normal anastomosis, normal LV function.      Other hx of HTN, HLD, COPD  .    Last nuc 01/12/16 with EF 62%, EF 55-65%, normal study.  Echo 09/2015 with EF 60-65%, no RWMA, MV is moderately to severely calcified. Annulus.  Mild stenosis, PA pk pressure 42 mHg.  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 Patent subclavian arteries.   Today she has increased swelling in her legs.  She has not increased her salt intake she keeps legs elevated frequently.  In the past when she takes her lasix she has pain all over.  So she is hesitant to increase dose.  Her glucose with her type I diabetes  is running higher than usual.   She has chronic chest pain and there is no change in this.  Her SOB is somewhat worse.  She does have sleep apnea and plans to be retested.    She has iron def. Anemia and receives Iron injections monthly.  She did have a diverticular bleed in Feb but none since.   Past Medical History:  Diagnosis Date  . Acute myocardial infarction, unspecified site, episode of care unspecified   . Adenomatous colon polyp   . Anxiety   . Chest pain   . Coronary artery disease   . Diabetes mellitus   . GERD (gastroesophageal reflux disease)   . Hiatal hernia   . Hyperlipidemia   . Hypertension   . PONV  (postoperative nausea and vomiting)   . Schatzki's ring   . Shoulder injury    resolved after shoulder surgery  . Unspecified asthma(493.90)   . Upper respiratory symptom    02-13-14 "runny nose, mild cough,sniffles" -instructed to inform MD if symptoms worsens    Past Surgical History:  Procedure Laterality Date  . ABDOMINAL HYSTERECTOMY    . APPENDECTOMY     came out with Hysterectomy  . CARDIAC CATHETERIZATION  07/23/2009   EF 60%  . CARDIAC CATHETERIZATION  10/11/2008  . CARDIAC CATHETERIZATION  03/01/2007   EF 75-80%  . CARDIOVASCULAR STRESS TEST  11/15/2007   EF 60%  . COLONOSCOPY    . CORONARY ARTERY BYPASS GRAFT     SEVERELY DISEASED SAPHENOUS VEIN GRAFT TO THE RIGHT CORONARY ARTERY BUT WITH FAIRLY WELL PRESERVED FLOW TO THE DISTAL RIGHT CORONARY ARTERY FROM THE NATIVE CIRCULATION-RESTART  CATH IN JUNE 2000, REVEALS MILD/MODERATE  CAD WITH GOOD FLOW DOWN HER LAD  . ESOPHAGEAL DILATION    . ESOPHAGOGASTRODUODENOSCOPY    . EYE SURGERY     bilateral cataract surgery with lens implant  . lense removal Left   . ROTATOR CUFF REPAIR  right and left  . TONSILLECTOMY     age 75  . 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 tumor removed kidney    . US ECHOCARDIOGRAPHY  03/08/2008   EF 55-60%     Current Outpatient Prescriptions  Medication Sig Dispense Refill  . albuterol (PROVENTIL HFA;VENTOLIN HFA) 108 (90 BASE) MCG/ACT inhaler Inhale 2 puffs into the lungs every 6 (six) hours as needed. For shortness of breath. 1 Inhaler 0  . albuterol (PROVENTIL) (2.5 MG/3ML) 0.083% nebulizer solution Take 2.5 mg by nebulization every 6 (six) hours as needed for wheezing or shortness of breath.    . ALPRAZolam (XANAX) 0.5 MG tablet Take 0.5 mg by mouth 2 (two) times daily as needed for anxiety.     . baclofen (LIORESAL) 20 MG tablet Take 20 mg by mouth  daily.     . benzonatate (TESSALON) 100 MG capsule Take 100 mg by mouth 3 (three) times daily as needed for cough.    . Blood Glucose Monitoring Suppl (ONE TOUCH ULTRA MINI) w/Device KIT 3 (three) times daily. for testing  0  . budesonide (PULMICORT) 0.5 MG/2ML nebulizer solution Take 0.5 mg by nebulization 2 (two) times daily. Wheezing or shortness of breath    . Budesonide 90 MCG/ACT inhaler Inhale 2 puffs into the lungs 2 (two) times daily. Wheezing or shortness of breath    . Calcium & Magnesium Carbonates (MYLANTA PO) Take 1 tablet by mouth daily as needed (for indigestion).     . Cholecalciferol (VITAMIN D) 2000 UNITS tablet Take 2,000 Units by mouth daily.     . Cyanocobalamin (VITAMIN B-12 IJ) Inject as directed every 30 (thirty) days.    . cyclobenzaprine (FLEXERIL) 5 MG tablet TAKE 1 TABLET BY MOUTH AT BEDTIME 30 tablet 3  . diclofenac sodium (VOLTAREN) 1 % GEL Apply 2 g topically every 4 (four) hours.    Marland Kitchen diltiazem (CARDIZEM CD) 240 MG 24 hr capsule Take 1 capsule (240 mg total) by mouth daily. 30 capsule 10  . fexofenadine (ALLEGRA) 180 MG tablet Take 1 tablet (180 mg total) by mouth daily. 30 tablet 6  . formoterol (PERFOROMIST) 20 MCG/2ML nebulizer solution Take 2 mLs (20 mcg total) by nebulization 2 (two) times daily. 60 mL 0  . furosemide (LASIX) 40 MG tablet Take 40 mg by mouth every other day.     . insulin lispro protamine-insulin lispro (HUMALOG 75/25) (75-25) 100 UNIT/ML SUSP Inject 22-27 Units into the skin 2 (two) times daily with a meal. Takes 22 units in the morning and 27 units with supper    . loperamide (IMODIUM) 2 MG capsule Take 2 mg by mouth as needed for diarrhea or loose stools.     . meclizine (ANTIVERT) 25 MG tablet Take 25 mg by mouth 2 (two) times daily as needed for dizziness.     . methocarbamol (ROBAXIN) 500 MG tablet Take 500 mg by mouth every 8 (eight) hours as needed for muscle spasms.  1  . metoprolol succinate (TOPROL-XL) 50 MG 24 hr tablet Take 1  tablet (50 mg total) by mouth daily. 30 tablet 10  . montelukast (SINGULAIR) 5 MG chewable tablet Chew 1 tablet (5 mg total) by mouth at bedtime. 30 tablet 3  . Multiple Vitamin (MULTIVITAMIN WITH MINERALS) TABS Take 1 tablet by mouth daily.    . Nebulizers (COMPRESSOR/NEBULIZER) MISC Use  with albuterol 1 each 0  . nitroGLYCERIN (NITROSTAT) 0.4 MG SL tablet Place 0.4 mg under the tongue every 5 (five) minutes x 3 doses as needed for chest pain.     Marland Kitchen omeprazole (PRILOSEC) 40 MG capsule Take 1 capsule (40 mg total) by mouth 2 (two) times daily. 60 capsule 5  . ONE TOUCH ULTRA TEST test strip CHECK BLOOD SUGAR TWICE A DAY DX: E11.9  3  . ONETOUCH DELICA LANCETS 70B MISC 3 (three) times daily. for testing  0  . Pancrelipase, Lip-Prot-Amyl, 24000 units CPEP Take 2 capsules (48,000 Units total) by mouth 3 (three) times daily with meals. Take 1 capsule with a snack 210 capsule 6  . potassium chloride SA (K-DUR,KLOR-CON) 20 MEQ tablet Take 20 mEq by mouth 2 (two) times daily.    . isosorbide mononitrate (IMDUR) 60 MG 24 hr tablet Take 1 tablet (60 mg total) by mouth daily. 30 tablet 11   Current Facility-Administered Medications  Medication Dose Route Frequency Provider Last Rate Last Dose  . cyanocobalamin ((VITAMIN B-12)) injection 1,000 mcg  1,000 mcg Intramuscular Q30 days Binnie Rail, MD   1,000 mcg at 03/26/16 1658    Allergies:   Amlodipine; Banana; Co q10 [coenzyme q10]; Codeine; Morphine; Statins; and Sulfonamide derivatives    Social History:  The patient  reports that she quit smoking about 40 years ago. Her smoking use included Cigarettes. She started smoking about 61 years ago. She has a 10.50 pack-year smoking history. She has never used smokeless tobacco. She reports that she does not drink alcohol or use drugs.   Family History:  The patient's family history includes Diabetes in her maternal grandfather and mother; Emphysema in her paternal aunt and paternal uncle; Esophageal cancer  (age of onset: 57) in her brother; Heart attack in her father; Heart disease in her maternal grandfather; Heart failure in her maternal grandfather; Rheum arthritis in her sister; Stomach cancer in her father.    ROS:  General:no colds or fevers, no weight changes Skin:no rashes or ulcers HEENT:no blurred vision, no congestion CV:see HPI PUL:see HPI, hx of asthma GI:no diarrhea constipation or melena, no indigestion GU:no hematuria, no dysuria MS:no joint pain, no claudication Neuro:no syncope, no lightheadedness Endo:+ diabetes, no thyroid disease  Wt Readings from Last 3 Encounters:  06/21/16 207 lb 12.8 oz (94.3 kg)  05/19/16 205 lb (93 kg)  04/26/16 204 lb 9.6 oz (92.8 kg)     PHYSICAL EXAM: VS:  BP 111/64   Pulse 78   Ht '5\' 2"'  (1.575 m)   Wt 207 lb 12.8 oz (94.3 kg)   BMI 38.01 kg/m  , BMI Body mass index is 38.01 kg/m. General:Pleasant affect, NAD Skin:Warm and dry, brisk capillary refill HEENT:normocephalic, sclera clear, mucus membranes moist Neck:supple, no JVD, no bruits  Heart:S1S2 RRR without murmur, gallup, rub or click Lungs:clear without rales, rhonchi, or wheezes EML:JQGB, non tender, + BS, do not palpate liver spleen or masses Ext:no lower ext edema, 2+ pedal pulses, 2+ radial pulses Neuro:alert and oriented, MAE, follows commands, + facial symmetry    EKG:  EKG is NOT ordered today.    Recent Labs: 05/19/2016: ALT 11; BUN 20; Creatinine, Ser 0.69; Hemoglobin 12.0; Platelets 276.0; Potassium 4.4; Sodium 139; TSH 1.25    Lipid Panel    Component Value Date/Time   CHOL 161 10/06/2011 0500   TRIG 109 10/06/2011 0500   HDL 37 (L) 10/06/2011 0500   CHOLHDL 4.4 10/06/2011 0500   VLDL 22  10/06/2011 0500   LDLCALC 102 (H) 10/06/2011 0500       Other studies Reviewed: Additional studies/ records that were reviewed today include: . Nuc Study 01/2016  Study Highlights    Nuclear stress EF: 62%. The left ventricular ejection fraction is normal  (55-65%).  There was no ST segment deviation noted during stress.  The study is normal.  This is a low risk study.     Echo 09/2015  Study Conclusions  - Left ventricle: The cavity size was normal. Wall thickness was   normal. Systolic function was normal. The estimated ejection   fraction was in the range of 60% to 65%. Wall motion was normal;   there were no regional wall motion abnormalities. Abnormal   relaxation with increased filling pressures. - Ventricular septum: Septal motion showed paradox. These changes   are consistent with a post-thoracotomy state. - Mitral valve: Moderately to severely calcified annulus. The   findings are consistent with mild stenosis. - Pulmonary arteries: Systolic pressure was mildly increased. PA   peak pressure: 42 mm Hg (S).   ASSESSMENT AND PLAN:  1.  Lower ext edema increasing, will have her take 80 mg Lasix every other day for 2 times. Then back to 40 mg every other day.   I will check BMP pro BNP and CBC today  Follow up in 1 week to see if improved.  2.  CAD s/p CABG with VG to RCA occluded.   3. Chronic chest pain, not felt to be cardiac  4.  Iron def. Anemia, receives Iron injections, will check CBC today  5.  DM-type1  Glucose has been elevated per PCP.  6. HTN essential controlled  7. HLD stable intolerant to statins.    Current medicines are reviewed with the patient today.  The patient Has no concerns regarding medicines.  The following changes have been made:  See above Labs/ tests ordered today include:see above  Disposition:   FU:  see above  Signed, Cecilie Kicks, NP  06/21/2016 5:16 PM    [facility] [place], [place], [place] Phone: [phone]; Fax: [phone]

## 2016-06-21 NOTE — Patient Instructions (Signed)
Medication Instructions:  Your physician has recommended you make the following change in your medication:  1. Increase your lasix to (80 mg ) every other day two times than back to (40 mg) every other day.    Labwork: Your physician recommends that you have lab work today: bmet/cbc/bnp   Testing/Procedures: -None  Follow-Up: Your physician recommends that you keep your scheduled  follow-up appointment with Cecilie Kicks, NP.  Any Other Special Instructions Will Be Listed Below (If Applicable).     If you need a refill on your cardiac medications before your next appointment, please call your pharmacy.

## 2016-06-22 LAB — CBC WITH DIFFERENTIAL/PLATELET
Basophils Absolute: 0 10*3/uL (ref 0.0–0.2)
Basos: 1 %
EOS (ABSOLUTE): 0.1 10*3/uL (ref 0.0–0.4)
EOS: 2 %
Hematocrit: 35.5 % (ref 34.0–46.6)
Hemoglobin: 10.9 g/dL — ABNORMAL LOW (ref 11.1–15.9)
IMMATURE GRANS (ABS): 0 10*3/uL (ref 0.0–0.1)
IMMATURE GRANULOCYTES: 0 %
Lymphocytes Absolute: 2.2 10*3/uL (ref 0.7–3.1)
Lymphs: 33 %
MCH: 23.8 pg — ABNORMAL LOW (ref 26.6–33.0)
MCHC: 30.7 g/dL — ABNORMAL LOW (ref 31.5–35.7)
MCV: 78 fL — ABNORMAL LOW (ref 79–97)
MONOS ABS: 0.5 10*3/uL (ref 0.1–0.9)
Monocytes: 8 %
NEUTROS PCT: 56 %
Neutrophils Absolute: 3.7 10*3/uL (ref 1.4–7.0)
PLATELETS: 293 10*3/uL (ref 150–379)
RBC: 4.58 x10E6/uL (ref 3.77–5.28)
RDW: 15.9 % — AB (ref 12.3–15.4)
WBC: 6.6 10*3/uL (ref 3.4–10.8)

## 2016-06-22 LAB — BASIC METABOLIC PANEL
BUN/Creatinine Ratio: 24 (ref 12–28)
BUN: 18 mg/dL (ref 8–27)
CALCIUM: 8.9 mg/dL (ref 8.7–10.3)
CO2: 23 mmol/L (ref 18–29)
CREATININE: 0.75 mg/dL (ref 0.57–1.00)
Chloride: 100 mmol/L (ref 96–106)
GFR calc Af Amer: 89 mL/min/{1.73_m2} (ref >59)
GFR, EST NON AFRICAN AMERICAN: 77 mL/min/{1.73_m2} (ref >59)
GLUCOSE: 188 mg/dL — AB (ref 65–99)
POTASSIUM: 4.5 mmol/L (ref 3.5–5.2)
SODIUM: 143 mmol/L (ref 134–144)

## 2016-06-22 LAB — PRO B NATRIURETIC PEPTIDE: NT-Pro BNP: 128 pg/mL (ref 0–738)

## 2016-06-28 ENCOUNTER — Ambulatory Visit (INDEPENDENT_AMBULATORY_CARE_PROVIDER_SITE_OTHER): Payer: Medicare Other | Admitting: Cardiology

## 2016-06-28 ENCOUNTER — Encounter: Payer: Self-pay | Admitting: Cardiology

## 2016-06-28 VITALS — BP 132/84 | HR 64 | Ht 63.0 in | Wt 206.8 lb

## 2016-06-28 DIAGNOSIS — D508 Other iron deficiency anemias: Secondary | ICD-10-CM | POA: Diagnosis not present

## 2016-06-28 DIAGNOSIS — I10 Essential (primary) hypertension: Secondary | ICD-10-CM | POA: Diagnosis not present

## 2016-06-28 DIAGNOSIS — I5032 Chronic diastolic (congestive) heart failure: Secondary | ICD-10-CM | POA: Diagnosis not present

## 2016-06-28 DIAGNOSIS — I2510 Atherosclerotic heart disease of native coronary artery without angina pectoris: Secondary | ICD-10-CM | POA: Diagnosis not present

## 2016-06-28 DIAGNOSIS — R600 Localized edema: Secondary | ICD-10-CM

## 2016-06-28 NOTE — Patient Instructions (Signed)
Medication Instructions:  Your physician recommends that you continue on your current medications as directed. Please refer to the Current Medication list given to you today.   Ok to increase Flexeril to twice daily on the days you take Lasix  Labwork: Magnesium level today  Testing/Procedures: None  ordered  Follow-Up: Your physician recommends that you schedule a follow-up appointment in: 2 months with Dr.Nahser   Any Other Special Instructions Will Be Listed Below (If Applicable). Purchase over the counter compression stockings and wear them daily for lower extremity swelling     If you need a refill on your cardiac medications before your next appointment, please call your pharmacy.

## 2016-06-28 NOTE — Progress Notes (Signed)
Cardiology Office Note   Date:  06/28/2016   ID:  ZETTIE GOOTEE, DOB 1941/01/08, MRN [id number]  PCP:  Binnie Rail, MD  Cardiologist:  Dr. Acie Fredrickson    Chief Complaint  Patient presents with  . Leg Swelling      History of Present Illness: [name] is a 76 y.o. female who presents for lower ext edema.  She was seen 06/21/16 for edema and her lasix was increased to 80 mg every other day for 2 times then back to 40 mg every other day  ( she has problems of leg cramps if she takes lasix daily).   Labs revelaed Hgb to be down from 12 to 10.9 and hct 35.5 renal was normal wit K+ 4.5 and Cr 0.75.  Pro BNP was 128.    She has hx of HTN, HLD, COPD and anemia.  She has a hx of CAD post CABG last cath 04/2012 with VG to RCA occluded, LIMA to LAD small but widely patent.There is a slight stenosis at the anastomosis but it does not appear to be obstructive.  VG to 1st diag with normal graft normal anastomosis, normal LV function.        Last nuc 01/12/16 with EF 62%, EF 55-65%, normal study.  Echo 09/2015 with EF 60-65%, no RWMA, MV is moderately to severely calcified. Annulus.  Mild stenosis, PA pk pressure 42 mHg.  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 Patent subclavian arteries.   She has sleep apnea but no longer wears CPAP- it was arranged through Neuro as she has headaches.  She will try to use it again.  Today lower ext edema is improved today but yesterday was significant.  She soaked her feet at home and it improved.  She still has significant muscle cramps with lasix.  Flexeril does not seem to help. No SOB or chest pain.    Past Medical History:  Diagnosis Date  . Acute myocardial infarction, unspecified site, episode of care unspecified   . Adenomatous colon polyp   . Anxiety   . Chest pain   . Coronary artery disease   . Diabetes mellitus   . GERD (gastroesophageal reflux disease)   . Hiatal hernia   . Hyperlipidemia   . Hypertension   . PONV  (postoperative nausea and vomiting)   . Schatzki's ring   . Shoulder injury    resolved after shoulder surgery  . Unspecified asthma(493.90)   . Upper respiratory symptom    02-13-14 "runny nose, mild cough,sniffles" -instructed to inform MD if symptoms worsens    Past Surgical History:  Procedure Laterality Date  . ABDOMINAL HYSTERECTOMY    . APPENDECTOMY     came out with Hysterectomy  . CARDIAC CATHETERIZATION  07/23/2009   EF 60%  . CARDIAC CATHETERIZATION  10/11/2008  . CARDIAC CATHETERIZATION  03/01/2007   EF 75-80%  . CARDIOVASCULAR STRESS TEST  11/15/2007   EF 60%  . COLONOSCOPY    . CORONARY ARTERY BYPASS GRAFT     SEVERELY DISEASED SAPHENOUS VEIN GRAFT TO THE RIGHT CORONARY ARTERY BUT WITH FAIRLY WELL PRESERVED FLOW TO THE DISTAL RIGHT CORONARY ARTERY FROM THE NATIVE CIRCULATION-RESTART  CATH IN JUNE 2000, REVEALS MILD/MODERATE  CAD WITH GOOD FLOW DOWN HER LAD  . ESOPHAGEAL DILATION    . ESOPHAGOGASTRODUODENOSCOPY    . EYE SURGERY     bilateral cataract surgery with lens implant  . lense removal Left   . ROTATOR CUFF REPAIR  right and left  . TONSILLECTOMY     age 35  . 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 tumor removed kidney    . US ECHOCARDIOGRAPHY  03/08/2008   EF 55-60%     Current Outpatient Prescriptions  Medication Sig Dispense Refill  . albuterol (PROVENTIL HFA;VENTOLIN HFA) 108 (90 BASE) MCG/ACT inhaler Inhale 2 puffs into the lungs every 6 (six) hours as needed. For shortness of breath. 1 Inhaler 0  . albuterol (PROVENTIL) (2.5 MG/3ML) 0.083% nebulizer solution Take 2.5 mg by nebulization every 6 (six) hours as needed for wheezing or shortness of breath.    . ALPRAZolam (XANAX) 0.5 MG tablet Take 0.5 mg by mouth 2 (two) times daily as needed for anxiety.     . baclofen (LIORESAL) 20 MG tablet Take 20 mg by mouth  daily.     . benzonatate (TESSALON) 100 MG capsule Take 100 mg by mouth 3 (three) times daily as needed for cough.    . Blood Glucose Monitoring Suppl (ONE TOUCH ULTRA MINI) w/Device KIT 3 (three) times daily. for testing  0  . Calcium & Magnesium Carbonates (MYLANTA PO) Take 1 tablet by mouth daily as needed (for indigestion).     . Cholecalciferol (VITAMIN D) 2000 UNITS tablet Take 2,000 Units by mouth daily.     . Cyanocobalamin (VITAMIN B-12 IJ) Inject as directed every 30 (thirty) days.    . cyclobenzaprine (FLEXERIL) 5 MG tablet TAKE 1 TABLET BY MOUTH AT BEDTIME 30 tablet 3  . diclofenac sodium (VOLTAREN) 1 % GEL Apply 2 g topically every 4 (four) hours.    Marland Kitchen diltiazem (CARDIZEM CD) 240 MG 24 hr capsule Take 1 capsule (240 mg total) by mouth daily. 30 capsule 10  . fexofenadine (ALLEGRA) 180 MG tablet Take 1 tablet (180 mg total) by mouth daily. 30 tablet 6  . furosemide (LASIX) 40 MG tablet Take 80 mg by mouth every other day.     . insulin lispro protamine-insulin lispro (HUMALOG 75/25) (75-25) 100 UNIT/ML SUSP Inject 22-27 Units into the skin 2 (two) times daily with a meal. Takes 22 units in the morning and 27 units with supper    . loperamide (IMODIUM) 2 MG capsule Take 2 mg by mouth as needed for diarrhea or loose stools.     . meclizine (ANTIVERT) 25 MG tablet Take 25 mg by mouth 2 (two) times daily as needed for dizziness.     . methocarbamol (ROBAXIN) 500 MG tablet Take 500 mg by mouth every 8 (eight) hours as needed for muscle spasms.  1  . metoprolol succinate (TOPROL-XL) 50 MG 24 hr tablet Take 1 tablet (50 mg total) by mouth daily. 30 tablet 10  . montelukast (SINGULAIR) 5 MG chewable tablet Chew 1 tablet (5 mg total) by mouth at bedtime. 30 tablet 3  . Multiple Vitamin (MULTIVITAMIN WITH MINERALS) TABS Take 1 tablet by mouth daily.    . Nebulizers (COMPRESSOR/NEBULIZER) MISC Use with albuterol 1 each 0  . nitroGLYCERIN (NITROSTAT) 0.4 MG SL tablet Place 0.4 mg under the tongue  every 5 (five) minutes x 3 doses as needed for chest pain.     Marland Kitchen omeprazole (PRILOSEC) 40 MG capsule Take 1 capsule (40 mg total) by mouth 2 (two) times daily. 60 capsule 5  . ONE TOUCH ULTRA TEST test strip CHECK BLOOD SUGAR TWICE A DAY  DX: E11.9  3  . ONETOUCH DELICA LANCETS 33H MISC 3 (three) times daily. for testing  0  . Pancrelipase, Lip-Prot-Amyl, 24000 units CPEP Take 2 capsules (48,000 Units total) by mouth 3 (three) times daily with meals. Take 1 capsule with a snack 210 capsule 6  . potassium chloride SA (K-DUR,KLOR-CON) 20 MEQ tablet Take 20 mEq by mouth 2 (two) times daily.    . isosorbide mononitrate (IMDUR) 60 MG 24 hr tablet Take 1 tablet (60 mg total) by mouth daily. 30 tablet 11   Current Facility-Administered Medications  Medication Dose Route Frequency Provider Last Rate Last Dose  . cyanocobalamin ((VITAMIN B-12)) injection 1,000 mcg  1,000 mcg Intramuscular Q30 days Binnie Rail, MD   1,000 mcg at 03/26/16 1658    Allergies:   Amlodipine; Banana; Co q10 [coenzyme q10]; Codeine; Morphine; Statins; and Sulfonamide derivatives    Social History:  The patient  reports that she quit smoking about 40 years ago. Her smoking use included Cigarettes. She started smoking about 61 years ago. She has a 10.50 pack-year smoking history. She has never used smokeless tobacco. She reports that she does not drink alcohol or use drugs.   Family History:  The patient's family history includes Diabetes in her maternal grandfather and mother; Emphysema in her paternal aunt and paternal uncle; Esophageal cancer (age of onset: 45) in her brother; Heart attack in her father; Heart disease in her maternal grandfather; Heart failure in her maternal grandfather; Rheum arthritis in her sister; Stomach cancer in her father.    ROS:  General:no colds or fevers,  weight down 1 lb. Skin:no rashes or ulcers HEENT:no blurred vision, no congestion CV:see HPI PUL:see HPI GI:no diarrhea constipation or  melena, no indigestion GU:no hematuria, no dysuria MS:no joint pain, no claudication Neuro:no syncope, no lightheadedness Endo:no diabetes, no thyroid disease  Wt Readings from Last 3 Encounters:  06/28/16 206 lb 12.8 oz (93.8 kg)  06/21/16 207 lb 12.8 oz (94.3 kg)  05/19/16 205 lb (93 kg)     PHYSICAL EXAM: VS:  BP 132/84   Pulse 64   Ht '5\' 3"'  (1.6 m)   Wt 206 lb 12.8 oz (93.8 kg)   LMP  (LMP Unknown)   BMI 36.63 kg/m  , BMI Body mass index is 36.63 kg/m. General:Pleasant affect, NAD Skin:Warm and dry, brisk capillary refill HEENT:normocephalic, sclera clear, mucus membranes moist Neck:supple, no JVD, no bruits  Heart:S1S2 RRR without murmur, gallup, rub or click Lungs:clear without rales, rhonchi, or wheezes LKT:GYBW, non tender, + BS, do not palpate liver spleen or masses Ext:no lower ext edema, 2+ pedal pulses, 2+ radial pulses Neuro:alert and oriented, MAE, follows commands, + facial symmetry    EKG:  EKG is NOT ordered today.   Recent Labs: 05/19/2016: ALT 11; Hemoglobin 12.0; TSH 1.25 06/21/2016: BUN 18; Creatinine, Ser 0.75; NT-Pro BNP 128; Platelets 293; Potassium 4.5; Sodium 143    Lipid Panel    Component Value Date/Time   CHOL 161 10/06/2011 0500   TRIG 109 10/06/2011 0500   HDL 37 (L) 10/06/2011 0500   CHOLHDL 4.4 10/06/2011 0500   VLDL 22 10/06/2011 0500   LDLCALC 102 (H) 10/06/2011 0500       Other studies Reviewed: Additional studies/ records that were reviewed today include: . Echo 09/2015  Study Conclusions  - Left ventricle: The cavity size was normal. Wall thickness was normal. Systolic function was normal. The estimated ejection fraction was in the range of 60% to 65%. Wall  motion was normal; there were no regional wall motion abnormalities. Abnormal relaxation with increased filling pressures. - Ventricular septum: Septal motion showed paradox. These changes are consistent with a post-thoracotomy state. - Mitral valve:  Moderately to severely calcified annulus. The findings are consistent with mild stenosis. - Pulmonary arteries: Systolic pressure was mildly increased. PA peak pressure: 42 mm Hg (S).  ASSESSMENT AND PLAN:  1.  Lower ext edema improved today extra lasix only increases muscle cramps.  I have asked her to wear support stockings.  Will check Mg+ level --previous BNP was normal. She will follow up with Dr. Acie Fredrickson in 2 months.   2. Mild anemia- iron def. She is on Iron.   3. CAD s/p CABG with known VG to RCA occluded- no chest pain  4. HTN controlled.     Current medicines are reviewed with the patient today.  The patient Has no concerns regarding medicines.  The following changes have been made:  See above Labs/ tests ordered today include:see above  Disposition:   FU:  see above  Signed, Cecilie Kicks, NP  06/28/2016 2:12 PM    [address], [place], [place], [place] Phone: [phone]; Fax: [phone]

## 2016-06-29 LAB — MAGNESIUM: Magnesium: 2 mg/dL (ref 1.6–2.3)

## 2016-07-03 ENCOUNTER — Other Ambulatory Visit: Payer: Self-pay | Admitting: Neurology

## 2016-08-13 ENCOUNTER — Encounter: Payer: Self-pay | Admitting: Pulmonary Disease

## 2016-08-13 ENCOUNTER — Ambulatory Visit (INDEPENDENT_AMBULATORY_CARE_PROVIDER_SITE_OTHER): Payer: Medicare Other | Admitting: Pulmonary Disease

## 2016-08-13 VITALS — BP 124/70 | HR 68 | Ht 63.0 in | Wt 207.0 lb

## 2016-08-13 DIAGNOSIS — J309 Allergic rhinitis, unspecified: Secondary | ICD-10-CM

## 2016-08-13 DIAGNOSIS — K219 Gastro-esophageal reflux disease without esophagitis: Secondary | ICD-10-CM

## 2016-08-13 DIAGNOSIS — J4550 Severe persistent asthma, uncomplicated: Secondary | ICD-10-CM

## 2016-08-13 MED ORDER — BUDESONIDE-FORMOTEROL FUMARATE 160-4.5 MCG/ACT IN AERO: 2.0000 | INHALATION_SPRAY | Freq: Two times a day (BID) | RESPIRATORY_TRACT | 0 refills | Status: DC

## 2016-08-13 MED ORDER — AEROCHAMBER MV MISC: 0 refills | Status: DC

## 2016-08-13 NOTE — Progress Notes (Signed)
Subjective:    Patient ID: [name], female    DOB: 04/17/1940, 76 y.o.   MRN: [id number]  C.C.:  Follow-up for Severe, Persistent Asthma w/ Fixed Airway Obstruction, Chronic Allergic Rhinitis, GERD, & Mild-Moderate OSA.  HPI  Severe, persistent asthma with fixed airway obstruction: Patient given samples of Perforomist and Pulmicort inhaler at last appointment. She has a new, intermittent, nonproductive cough. She has noticed more wheezing as well. She is waking up at night with coughing as well. She has noticed more symptoms since the heat & humidity have increased. She has been usin gher rescue inhaler more frequently and has ran out of it. She has also been unable to afford her nebulizer medications for about a month.   Chronic allergic rhinitis: Prescribed Singulair & Allegra. She has also noticed increase sinus congestion & drainage as well despite adherence to her medications.   GERD: Previously following with GI. Prescribed Protonix & Pepcid twice daily. She has noticed increased reflux. She is taking Prilosec BID now and using Pepcid only as needed.   OSA: Followed by Dr. Rexene Alberts with Neurology. She reports she was previously compliant with CPAP but it was taken because she supposedly wasn't using it sufficiently. The thought was she was taking it off in her sleep but she reports she was waking it up on her face.   Review of Systems  No fever, chills, or sweats. She has noticed increased chest tightness but no pain or pressure. No new headaches or vision changes.   Allergies  Allergen Reactions  . Amlodipine Other (See Comments)    hallucinations   . Banana Nausea And Vomiting    Stomach pumped  . Co Q10 [Coenzyme Q10]     Body cramps  . Codeine     Hallucinate, loose identity and don't know who I am  . Morphine Other (See Comments)    Can not function, it immobilizes me   . Statins     Muscle cramps  . Sulfonamide Derivatives Swelling   Current Outpatient Prescriptions  on File Prior to Visit  Medication Sig Dispense Refill  . ALPRAZolam (XANAX) 0.5 MG tablet Take 0.5 mg by mouth 2 (two) times daily as needed for anxiety.     . baclofen (LIORESAL) 20 MG tablet Take 20 mg by mouth daily.     . Blood Glucose Monitoring Suppl (ONE TOUCH ULTRA MINI) w/Device KIT 3 (three) times daily. for testing  0  . Calcium & Magnesium Carbonates (MYLANTA PO) Take 1 tablet by mouth daily as needed (for indigestion).     . Cholecalciferol (VITAMIN D) 2000 UNITS tablet Take 2,000 Units by mouth daily.     . Cyanocobalamin (VITAMIN B-12 IJ) Inject as directed every 30 (thirty) days.    . cyclobenzaprine (FLEXERIL) 5 MG tablet TAKE 1 TABLET BY MOUTH AT BEDTIME 30 tablet 5  . diclofenac sodium (VOLTAREN) 1 % GEL Apply 2 g topically every 4 (four) hours.    Marland Kitchen diltiazem (CARDIZEM CD) 240 MG 24 hr capsule Take 1 capsule (240 mg total) by mouth daily. 30 capsule 10  . fexofenadine (ALLEGRA) 180 MG tablet Take 1 tablet (180 mg total) by mouth daily. 30 tablet 6  . furosemide (LASIX) 40 MG tablet Take 80 mg by mouth every other day.     . insulin lispro protamine-insulin lispro (HUMALOG 75/25) (75-25) 100 UNIT/ML SUSP Inject 22-27 Units into the skin 2 (two) times daily with a meal. Takes 22 units in the morning and  27 units with supper    . loperamide (IMODIUM) 2 MG capsule Take 2 mg by mouth as needed for diarrhea or loose stools.     . meclizine (ANTIVERT) 25 MG tablet Take 25 mg by mouth 2 (two) times daily as needed for dizziness.     . methocarbamol (ROBAXIN) 500 MG tablet Take 500 mg by mouth every 8 (eight) hours as needed for muscle spasms.  1  . metoprolol succinate (TOPROL-XL) 50 MG 24 hr tablet Take 1 tablet (50 mg total) by mouth daily. 30 tablet 10  . montelukast (SINGULAIR) 5 MG chewable tablet Chew 1 tablet (5 mg total) by mouth at bedtime. 30 tablet 3  . Multiple Vitamin (MULTIVITAMIN WITH MINERALS) TABS Take 1 tablet by mouth daily.    . Nebulizers (COMPRESSOR/NEBULIZER)  MISC Use with albuterol 1 each 0  . nitroGLYCERIN (NITROSTAT) 0.4 MG SL tablet Place 0.4 mg under the tongue every 5 (five) minutes x 3 doses as needed for chest pain.     Marland Kitchen omeprazole (PRILOSEC) 40 MG capsule Take 1 capsule (40 mg total) by mouth 2 (two) times daily. 60 capsule 5  . ONE TOUCH ULTRA TEST test strip CHECK BLOOD SUGAR TWICE A DAY DX: E11.9  3  . ONETOUCH DELICA LANCETS 23F MISC 3 (three) times daily. for testing  0  . Pancrelipase, Lip-Prot-Amyl, 24000 units CPEP Take 2 capsules (48,000 Units total) by mouth 3 (three) times daily with meals. Take 1 capsule with a snack 210 capsule 6  . potassium chloride SA (K-DUR,KLOR-CON) 20 MEQ tablet Take 20 mEq by mouth 2 (two) times daily.    Marland Kitchen albuterol (PROVENTIL HFA;VENTOLIN HFA) 108 (90 BASE) MCG/ACT inhaler Inhale 2 puffs into the lungs every 6 (six) hours as needed. For shortness of breath. (Patient not taking: Reported on 08/13/2016) 1 Inhaler 0  . albuterol (PROVENTIL) (2.5 MG/3ML) 0.083% nebulizer solution Take 2.5 mg by nebulization every 6 (six) hours as needed for wheezing or shortness of breath.    . benzonatate (TESSALON) 100 MG capsule Take 100 mg by mouth 3 (three) times daily as needed for cough.    . isosorbide mononitrate (IMDUR) 60 MG 24 hr tablet Take 1 tablet (60 mg total) by mouth daily. 30 tablet 11   Current Facility-Administered Medications on File Prior to Visit  Medication Dose Route Frequency Provider Last Rate Last Dose  . cyanocobalamin ((VITAMIN B-12)) injection 1,000 mcg  1,000 mcg Intramuscular Q30 days Binnie Rail, MD   1,000 mcg at 03/26/16 1658   Past Medical History:  Diagnosis Date  . Acute myocardial infarction, unspecified site, episode of care unspecified   . Adenomatous colon polyp   . Anxiety   . Chest pain   . Coronary artery disease   . Diabetes mellitus   . GERD (gastroesophageal reflux disease)   . Hiatal hernia   . Hyperlipidemia   . Hypertension   . PONV (postoperative nausea and  vomiting)   . Schatzki's ring   . Shoulder injury    resolved after shoulder surgery  . Unspecified asthma(493.90)   . Upper respiratory symptom    02-13-14 "runny nose, mild cough,sniffles" -instructed to inform MD if symptoms worsens   Past Surgical History:  Procedure Laterality Date  . ABDOMINAL HYSTERECTOMY    . APPENDECTOMY     came out with Hysterectomy  . CARDIAC CATHETERIZATION  07/23/2009   EF 60%  . CARDIAC CATHETERIZATION  10/11/2008  . CARDIAC CATHETERIZATION  03/01/2007   EF 75-80%  .  CARDIOVASCULAR STRESS TEST  11/15/2007   EF 60%  . COLONOSCOPY    . CORONARY ARTERY BYPASS GRAFT     SEVERELY DISEASED SAPHENOUS VEIN GRAFT TO THE RIGHT CORONARY ARTERY BUT WITH FAIRLY WELL PRESERVED FLOW TO THE DISTAL RIGHT CORONARY ARTERY FROM THE NATIVE CIRCULATION-RESTART  CATH IN JUNE 2000, REVEALS MILD/MODERATE  CAD WITH GOOD FLOW DOWN HER LAD  . ESOPHAGEAL DILATION    . ESOPHAGOGASTRODUODENOSCOPY    . EYE SURGERY     bilateral cataract surgery with lens implant  . lense removal Left   . ROTATOR CUFF REPAIR     right and left  . TONSILLECTOMY     age 74  . 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 tumor removed kidney    . US ECHOCARDIOGRAPHY  03/08/2008   EF 55-60%   Family History  Problem Relation Age of Onset  . Heart disease Maternal Grandfather   . Heart failure Maternal Grandfather   . Diabetes Maternal Grandfather   . Heart attack Father   . Stomach cancer Father   . Diabetes Mother   . Rheum arthritis Sister   . Emphysema Paternal Uncle   . Esophageal cancer Brother 45       she said he was born with it  . Emphysema Paternal Aunt   . Neuropathy Neg Hx   . Multiple sclerosis Neg Hx   . Colon cancer Neg Hx    Social History   Social History  . Marital status: Widowed    Spouse name: N/A  . Number of children: 4  . Years  of education: Doctorate   Occupational History  . Retired    Social History Main Topics  . Smoking status: Former Smoker    Packs/day: 0.50    Years: 21.00    Types: Cigarettes    Start date: 02/09/1955    Quit date: 02/09/1976  . Smokeless tobacco: Never Used  . Alcohol use No  . Drug use: No  . Sexual activity: Not Asked   Other Topics Concern  . None   Social History Narrative   Lives alone.   Caffeine use: Drinks 1 cup coffee/day      Originally from [place]. Previously has lived in [place]. Prior travel to [place], [place], [place], [place], [place], [place], [place], & [place]. Previously worked in [employer] She has a dog currently. No bird, mold, or hot tub exposure. She also pastors a church.       Objective:   Physical Exam BP 124/70 (BP Location: Right Arm, Patient Position: Sitting, Cuff Size: Large)   Pulse 68   Ht '5\' 3"'  (1.6 m)   Wt 207 lb (93.9 kg)   LMP  (LMP Unknown)   SpO2 99%   BMI 36.67 kg/m   General:  Awake. Alert. No acute distress. Obese. Integument:  Warm & dry. No rash on exposed skin.  Extremities:  No cyanosis or clubbing.  HEENT:  Moist mucus membranes. Minimal nasal turbinate swelling. No oral ulcers. Cardiovascular:  Regular rate. No edema. Unable to appreciate JVD. Pulmonary:  Diminished breath sounds bilateral lung bases. Otherwise clear to auscultation. No accessory muscle use on room air. Abdomen: Soft. Normal bowel sounds. Nondistended.  Musculoskeletal:  Normal bulk and tone. No joint deformity or effusion appreciated.  PFT 06/04/15: FVC 1.57 L (77%) FEV1 1.01 L (64%) FEV1/FVC 0.65  FEF 25-75 0.54 L (39%) negative bronchodilator response 02/21/15: FVC 1.60 L (78%) FEV1 1.03 L (65%) FEV1/FVC 0.64 FEF 25-75 0.51 L (37%) positive bronchodilator response 11/20/14: FVC 1.44 L (70%) FEV1 0.91 L (57%) FEV1/FVC 0.63 FEF 25-75 0.47 L (33%) positive bronchodilator response TLC 3.92 L (79%) RV 103% DLCO uncorrected 64% 11/12/13: FVC 1.57 L (74%) FEV1 1.09  L (67%) FEV1/FVC 0.70 FEF 25-75 0.72 L (50%) negative bronchodilator response 08/04/11: FVC 1.61 L (60%) FEV1 0.89 L (48%) FEV1/FVC 0.56 FEF 25-75 0.29 L (14%) positive bronchodilator response TLC 3.67 L (80%) RV 103% ERV 46% DLCO corrected 38%  6MWT 11/20/14:  Walked 336 meters / Baseline Sat 100% on RA / Nadir Sat 99% on RA (pt c/o pain in her right neck w/ dyspnea)  IMAGING CXR PA/LAT 03/13/15 (previously reviewed by me): No new nodule or opacity appreciated. No pleural effusion. Mild hyperinflation with flattening of the diaphragms. Heart normal in size. Mediastinum normal in contour.  BARIUM SWALLOW 10/15/14 (per radiologist): Schatzki ring distal esophagus maximum diameter 1.2 cm with barium tablet impacting at this ring.  CXR PA/LAT 02/13/14 (previously reviewed by me):  Sternotomy wires from prior thoracic surgery noted. Heart normal in size. No pleural effusion appreciated. No parenchymal nodule or opacification appreciated. Mild flattening of the diaphragms bilaterally.  CARDIAC TTE (10/06/11): LV normal in size. Normal regional wall motion. EF 60-65%. Grade 1 diastolic dysfunction. LA & RA normal in size. 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 RV normal in size and function. RVSP 19 mmHg. No aortic stenosis or regurgitation. No mitral stenosis or regurgitation. No pulmonic stenosis. Trivial tricuspid regurgitation. No pericardial effusion.  LABS 03/13/15 Procalcitonin:  <0.1 CBC: 10.9/11.8/37.3/245  10/08/14 IgG: 1060 IgM: 112 IgA: 272 IgE: 5 CBC: 6.1/11.3/36.0/293 Differential: Eos 0.3 (4.5%)  RAST panel: Negative Aspergillus antigen: <0.1  1/26/9 ABG:  7.39/44/113    Assessment & Plan:  76 y.o. female with severe, persistent asthma, GERD, chronic allergic rhinitis, & OSA. Patient does have fixed airway obstruction on spirometry. Since last appointment patient's CPAP was recalled by her insurance company reportedly due to insufficient use. She is having more symptoms from  her underlying asthma which is likely due to her inability to afford medications. Her reflux is slightly less controlled as well as her allergic rhinitis. Certainly her untreated sleep apnea could be worsening her asthma symptoms as well. I instructed the patient to keep me updated on the progress of applying for prescription drug assistance through [employer] for her Symbicort as well as a referral to [facility].  1. Severe, persistent asthma with fixed airway obstruction: Patient given samples of Symbicort 160/4.5 with spacer & prescription drug assistance paperwork for [employer]. Also referring to [facility] for help with her medication costs. Continuing Singulair. 2. GERD: Continuing twice a day Prilosec. No changes. 3. Chronic allergic rhinitis: Continuing Allegra and Singulair. No changes. 4. OSA: Followed by Neurology/Dr. Rexene Alberts. Patient's CPAP has been recalled by her insurance company. 5. Health maintenance: Status post Influenza October 2017, Pneumovax January 2006 & Prevnar 21 August 2015. 6. Follow-up: Return to clinic in 6 months or sooner if needed.  Sonia Baller Ashok Cordia, M.D. [facility] Pager:  [phone] After 3pm or if no response, call [phone] 2:35 PM 08/13/16

## 2016-08-13 NOTE — Progress Notes (Signed)
Patient seen in the office today and instructed on use of Symbicort 160 and spacer.  Patient expressed understanding and demonstrated technique. Pt was also given [employer] forms.

## 2016-08-13 NOTE — Addendum Note (Signed)
Addended by: Tyson Dense on: 08/13/2016 03:04 PM   Modules accepted: Orders

## 2016-08-13 NOTE — Patient Instructions (Addendum)
   Continue using your medications as prescribed.  Use the Symbicort inhaler we are giving you by doing 2 puffs twice daily with the spacer we gave you.  Remember to remove any dentures or partials you have before you use your inhaler. Remember to brush your teeth & tongue after you use your inhaler as well as rinse, gargle & spit to keep from getting thrush in your mouth or on your tongue (a white film).   We are putting in a referral to [facility] to help with your prescriptions but it is up to you to fill out the paperwork for [employer] to help with the Symbicort.   Keep me updated on the progress of both of these in case you are contacted and I am not so we make sure to keep you in samples or try another medication/drug company.   Call me if you have any new breathing problems or questions before your next appointment.

## 2016-08-27 ENCOUNTER — Ambulatory Visit: Payer: Medicare Other | Admitting: Gastroenterology

## 2016-08-30 ENCOUNTER — Telehealth: Payer: Self-pay | Admitting: Acute Care

## 2016-08-30 ENCOUNTER — Ambulatory Visit (INDEPENDENT_AMBULATORY_CARE_PROVIDER_SITE_OTHER)
Admission: RE | Admit: 2016-08-30 | Discharge: 2016-08-30 | Disposition: A | Discharge status: Home / Self Care | Payer: Medicare Other | Source: Ambulatory Visit | Attending: Acute Care | Admitting: Acute Care

## 2016-08-30 ENCOUNTER — Ambulatory Visit (INDEPENDENT_AMBULATORY_CARE_PROVIDER_SITE_OTHER): Payer: Medicare Other | Admitting: Acute Care

## 2016-08-30 ENCOUNTER — Encounter: Payer: Self-pay | Admitting: Acute Care

## 2016-08-30 DIAGNOSIS — J4550 Severe persistent asthma, uncomplicated: Secondary | ICD-10-CM

## 2016-08-30 DIAGNOSIS — R05 Cough: Secondary | ICD-10-CM

## 2016-08-30 DIAGNOSIS — R059 Cough, unspecified: Secondary | ICD-10-CM

## 2016-08-30 MED ORDER — ALBUTEROL SULFATE (2.5 MG/3ML) 0.083% IN NEBU: 2.5000 mg | INHALATION_SOLUTION | Freq: Four times a day (QID) | RESPIRATORY_TRACT | 3 refills | Status: DC | PRN

## 2016-08-30 MED ORDER — PREDNISONE 10 MG PO TABS
ORAL_TABLET | ORAL | 0 refills | Status: DC
Start: 2016-08-30 — End: 2016-09-07

## 2016-08-30 MED ORDER — LEVALBUTEROL HCL 0.63 MG/3ML IN NEBU
0.6300 mg | INHALATION_SOLUTION | Freq: Once | RESPIRATORY_TRACT | Status: AC
  Administered 2016-08-30: 0.63 mg via RESPIRATORY_TRACT

## 2016-08-30 MED ORDER — AZITHROMYCIN 250 MG PO TABS: ORAL_TABLET | ORAL | 0 refills | Status: DC

## 2016-08-30 NOTE — Telephone Encounter (Signed)
Called and spoke to pt. Informed her of the recs per SG. Pt verbalized understanding and denied any further questions or concerns at this time.   

## 2016-08-30 NOTE — Progress Notes (Signed)
Note reviewed.  Sonia Baller Ashok Cordia, M.D. [facility] Pager:  [phone] After 3pm or if no response, call [phone] 4:53 PM 08/30/16

## 2016-08-30 NOTE — Progress Notes (Signed)
Spoke with patient and informed her of results and recommendations. She did not have any questions and verbalized understanding. Nothing further is needed.

## 2016-08-30 NOTE — Telephone Encounter (Deleted)
Please call in doxycycline 100 mg twice daily 7 days for Miss Koehne.  Please call the patient and let her know. Ask her to eat yogurt daily while she is on antibiotic. Thank you

## 2016-08-30 NOTE — Telephone Encounter (Signed)
Please do not prescribe doxycycline. The patient was already treated with Z-pack. Thanks so much

## 2016-08-30 NOTE — Assessment & Plan Note (Signed)
Exacerbation after patient ran out of her Proventil nebulizers Fever of 102, cough with yellow-green secretions Wheezing Plan We will do a CXR today We will call you with results We will do a Xopenex treatment now in the office  We will send in prescription for Proventil nebs.  Use nebs  every 6 hours as needed for wheezing and shortness of breath. Continue using Symbicort and Singulair as you have been doing. Prednisone taper; 10 mg tablets:  2 tabs x 2 days 1 tab x 2 days then stop. Z Pack Eat yogurt while on antibiotic Follow up in 2 weeks to ensure you are better. Please contact office for sooner follow up if symptoms do not improve or worsen or seek emergency care

## 2016-08-30 NOTE — Patient Instructions (Addendum)
It is good to see you today. We will do a CXR today We will call you with results We will do a Xopenex treatment now in the office  We will send in prescription for Proventil nebs.  Use nebs  every 6 hours as needed for wheezing and shortness of breath. Continue using Symbicort and Singulair as you have been doing. Prednisone taper; 10 mg tablets:  2 tabs x 2 days 1 tab x 2 days then stop. Z Pack Eat yogurt while on antibiotic Follow up in 2 weeks to ensure you are better. Please contact office for sooner follow up if symptoms do not improve or worsen or seek emergency care

## 2016-08-30 NOTE — Progress Notes (Signed)
History of Present Illness [name] is a 76 y.o. female with Severe persistent asthma  And fixed upper airway obstruction. She is followed by Dr. Ashok Cordia.  HPI  Severe, persistent asthma with fixed airway obstruction: Patient given samples of Perforomist and Pulmicort inhaler at last appointment. She has a new, intermittent, nonproductive cough. She has noticed more wheezing as well. She is waking up at night with coughing as well. She has noticed more symptoms since the heat & humidity have increased. She has been usin gher rescue inhaler more frequently and has ran out of it. She has also been unable to afford her nebulizer medications for about a month.   Chronic allergic rhinitis: Prescribed Singulair & Allegra. She has also noticed increase sinus congestion & drainage as well despite adherence to her medications.   08/30/2016 Acute OV for Severe, Persistent Asthma w/ Fixed Airway Obstruction, Chronic Allergic Rhinitis, GERD, & Mild-Moderate OSA.  Pt. Presents for Acute OV. She states she has had progressive short of breath over the last 5 days. This correlates with the date that she ran out of her Proventil neb treatments 5 days ago. Sputum is yellow and gray. She had a Fever of 102 yesterday.She did not treat this with tylenol. She states she has been compliant with her Symbicort and Spiriva, and that she has plenty of both of those medications. She denies chest pain, orthopnea, or hemoptysis. Patient did not want prednisone taper. We did discuss the need and she is agreeable to a short taper.  Test Results: CXR 7/ 23/ 2018: No active cardiopulmonary disease. No evidence of pneumonia or pulmonary edema. 2. Aortic atherosclerosis.  CBC Latest Ref Rng & Units 06/21/2016 05/19/2016 03/26/2016  WBC 3.4 - 10.8 x10E3/uL 6.6 6.0 6.7  Hemoglobin 11.1 - 15.9 g/dL 10.9(L) 12.0 10.4(L)  Hematocrit 34.0 - 46.6 % 35.5 37.7 31.9(L)  Platelets 150 - 379 x10E3/uL 293 276.0 276.0    BMP Latest Ref  Rng & Units 06/21/2016 05/19/2016 03/19/2016  Glucose 65 - 99 mg/dL 188(H) 178(H) 112(H)  BUN 8 - 27 mg/dL '18 20 12  ' Creatinine 0.57 - 1.00 mg/dL 0.75 0.69 0.56  BUN/Creat Ratio 12 - 28 24 - -  Sodium 134 - 144 mmol/L 143 139 142  Potassium 3.5 - 5.2 mmol/L 4.5 4.4 3.8  Chloride 96 - 106 mmol/L 100 101 107  CO2 18 - 29 mmol/L 23 32 29  Calcium 8.7 - 10.3 mg/dL 8.9 9.2 8.4(L)    ProBNP    Component Value Date/Time   PROBNP 128 06/21/2016 1628   PROBNP 158.0 (H) 04/17/2012 1123    PFT    Component Value Date/Time   FEV1PRE 1.01 06/04/2015 1301   FEV1POST 1.12 06/04/2015 1301   FVCPRE 1.57 06/04/2015 1301   FVCPOST 1.63 06/04/2015 1301   TLC 3.92 11/20/2014 1121   DLCOUNC 14.72 11/20/2014 1121   PREFEV1FVCRT 65 06/04/2015 1301   PSTFEV1FVCRT 69 06/04/2015 1301     Past medical hx Past Medical History:  Diagnosis Date  . Acute myocardial infarction, unspecified site, episode of care unspecified   . Adenomatous colon polyp   . Anxiety   . Chest pain   . Coronary artery disease   . Diabetes mellitus   . GERD (gastroesophageal reflux disease)   . Hiatal hernia   . Hyperlipidemia   . Hypertension   . PONV (postoperative nausea and vomiting)   . Schatzki's ring   . Shoulder injury    resolved after shoulder surgery  .  Unspecified asthma(493.90)   . Upper respiratory symptom    02-13-14 "runny nose, mild cough,sniffles" -instructed to inform MD if symptoms worsens     Social History  Substance Use Topics  . Smoking status: Former Smoker    Packs/day: 0.50    Years: 21.00    Types: Cigarettes    Start date: 02/09/1955    Quit date: 02/09/1976  . Smokeless tobacco: Never Used  . Alcohol use No    Tobacco Cessation: Former smoker quit in 1978  Past surgical hx, Family hx, Social hx all reviewed.  Current Outpatient Prescriptions on File Prior to Visit  Medication Sig  . albuterol (PROVENTIL HFA;VENTOLIN HFA) 108 (90 BASE) MCG/ACT inhaler Inhale 2 puffs into the  lungs every 6 (six) hours as needed. For shortness of breath.  . ALPRAZolam (XANAX) 0.5 MG tablet Take 0.5 mg by mouth 2 (two) times daily as needed for anxiety.   . baclofen (LIORESAL) 20 MG tablet Take 20 mg by mouth daily.   . benzonatate (TESSALON) 100 MG capsule Take 100 mg by mouth 3 (three) times daily as needed for cough.  . Blood Glucose Monitoring Suppl (ONE TOUCH ULTRA MINI) w/Device KIT 3 (three) times daily. for testing  . budesonide-formoterol (SYMBICORT) 160-4.5 MCG/ACT inhaler Inhale 2 puffs into the lungs 2 (two) times daily.  . Calcium & Magnesium Carbonates (MYLANTA PO) Take 1 tablet by mouth daily as needed (for indigestion).   . Cholecalciferol (VITAMIN D) 2000 UNITS tablet Take 2,000 Units by mouth daily.   . Cyanocobalamin (VITAMIN B-12 IJ) Inject as directed every 30 (thirty) days.  . cyclobenzaprine (FLEXERIL) 5 MG tablet TAKE 1 TABLET BY MOUTH AT BEDTIME  . diclofenac sodium (VOLTAREN) 1 % GEL Apply 2 g topically every 4 (four) hours.  Marland Kitchen diltiazem (CARDIZEM CD) 240 MG 24 hr capsule Take 1 capsule (240 mg total) by mouth daily.  . fexofenadine (ALLEGRA) 180 MG tablet Take 1 tablet (180 mg total) by mouth daily.  . furosemide (LASIX) 40 MG tablet Take 80 mg by mouth every other day.   . insulin lispro protamine-insulin lispro (HUMALOG 75/25) (75-25) 100 UNIT/ML SUSP Inject 22-27 Units into the skin 2 (two) times daily with a meal. Takes 22 units in the morning and 27 units with supper  . loperamide (IMODIUM) 2 MG capsule Take 2 mg by mouth as needed for diarrhea or loose stools.   . meclizine (ANTIVERT) 25 MG tablet Take 25 mg by mouth 2 (two) times daily as needed for dizziness.   . methocarbamol (ROBAXIN) 500 MG tablet Take 500 mg by mouth every 8 (eight) hours as needed for muscle spasms.  . metoprolol succinate (TOPROL-XL) 50 MG 24 hr tablet Take 1 tablet (50 mg total) by mouth daily.  . montelukast (SINGULAIR) 5 MG chewable tablet Chew 1 tablet (5 mg total) by mouth at  bedtime.  . Multiple Vitamin (MULTIVITAMIN WITH MINERALS) TABS Take 1 tablet by mouth daily.  . Nebulizers (COMPRESSOR/NEBULIZER) MISC Use with albuterol  . nitroGLYCERIN (NITROSTAT) 0.4 MG SL tablet Place 0.4 mg under the tongue every 5 (five) minutes x 3 doses as needed for chest pain.   Marland Kitchen omeprazole (PRILOSEC) 40 MG capsule Take 1 capsule (40 mg total) by mouth 2 (two) times daily.  . ONE TOUCH ULTRA TEST test strip CHECK BLOOD SUGAR TWICE A DAY DX: E11.9  . ONETOUCH DELICA LANCETS 81O MISC 3 (three) times daily. for testing  . Pancrelipase, Lip-Prot-Amyl, 24000 units CPEP Take 2 capsules (48,000 Units  total) by mouth 3 (three) times daily with meals. Take 1 capsule with a snack  . potassium chloride SA (K-DUR,KLOR-CON) 20 MEQ tablet Take 20 mEq by mouth 2 (two) times daily.  Marland Kitchen Spacer/Aero-Holding Chambers (AEROCHAMBER MV) inhaler Use as instructed  . isosorbide mononitrate (IMDUR) 60 MG 24 hr tablet Take 1 tablet (60 mg total) by mouth daily.   Current Facility-Administered Medications on File Prior to Visit  Medication  . cyanocobalamin ((VITAMIN B-12)) injection 1,000 mcg     Allergies  Allergen Reactions  . Amlodipine Other (See Comments)    hallucinations   . Banana Nausea And Vomiting    Stomach pumped  . Co Q10 [Coenzyme Q10]     Body cramps  . Codeine     Hallucinate, loose identity and don't know who I am  . Morphine Other (See Comments)    Can not function, it immobilizes me   . Statins     Muscle cramps  . Sulfonamide Derivatives Swelling    Review Of Systems:  Constitutional:   No  weight loss, night sweats, +Fevers, chills, fatigue, or  lassitude.  HEENT:   No headaches,  Difficulty swallowing,  Tooth/dental problems, or  Sore throat,                No sneezing, itching, ear ache, nasal congestion, post nasal drip,   CV:  No chest pain,  Orthopnea, PND, swelling in lower extremities, anasarca, dizziness, palpitations, syncope.   GI  No heartburn,  indigestion, abdominal pain, nausea, vomiting, diarrhea, change in bowel habits, loss of appetite, bloody stools.   Resp: + shortness of breath with exertion or at rest.  + excess mucus, + productive cough,  + non-productive cough,  No coughing up of blood.  + change in color of mucus.  + wheezing.  No chest wall deformity  Skin: no rash or lesions.  GU: no dysuria, change in color of urine, no urgency or frequency.  No flank pain, no hematuria   MS:  No joint pain or swelling.  No decreased range of motion.  No back pain.  Psych:  No change in mood or affect. No depression or anxiety.  No memory loss.   Vital Signs BP 138/90 (BP Location: Left Arm, Cuff Size: Normal)   Pulse 89   Temp 98.9 F (37.2 C) (Oral)   Ht '5\' 2"'  (1.575 m)   Wt 204 lb 9.6 oz (92.8 kg)   LMP  (LMP Unknown)   SpO2 99%   BMI 37.42 kg/m    Physical Exam:  General- No distress,  A&Ox3, pleasant ENT: No sinus tenderness, TM clear, pale nasal mucosa, no oral exudate,no post nasal drip, no LAN Cardiac: S1, S2, regular rate and rhythm, no murmur Chest: + wheeze/no  rales/ dullness; no accessory muscle use, no nasal flaring, no sternal retractions, diminished per bases Abd.: Soft Non-tender, nondistended bowel sounds positive Ext: No clubbing cyanosis, edema Neuro:  normal strength, deconditioned at baseline Skin: No rashes, warm and dry Psych: normal mood and behavior   Assessment/Plan  Severe persistent asthma Exacerbation after patient ran out of her Proventil nebulizers Fever of 102, cough with yellow-green secretions Wheezing Plan We will do a CXR today We will call you with results We will do a Xopenex treatment now in the office  We will send in prescription for Proventil nebs.  Use nebs  every 6 hours as needed for wheezing and shortness of breath. Continue using Symbicort and Singulair as you have  been doing. Prednisone taper; 10 mg tablets:  2 tabs x 2 days 1 tab x 2 days then stop. Z  Pack Eat yogurt while on antibiotic Follow up in 2 weeks to ensure you are better. Please contact office for sooner follow up if symptoms do not improve or worsen or seek emergency care    Magdalen Spatz, NP 08/30/2016  4:33 PM

## 2016-09-07 ENCOUNTER — Encounter: Payer: Self-pay | Admitting: Neurology

## 2016-09-07 ENCOUNTER — Ambulatory Visit (INDEPENDENT_AMBULATORY_CARE_PROVIDER_SITE_OTHER): Payer: Medicare Other | Admitting: Neurology

## 2016-09-07 VITALS — BP 136/77 | HR 90 | Wt 204.4 lb

## 2016-09-07 DIAGNOSIS — M542 Cervicalgia: Secondary | ICD-10-CM

## 2016-09-07 DIAGNOSIS — G243 Spasmodic torticollis: Secondary | ICD-10-CM

## 2016-09-07 DIAGNOSIS — G4733 Obstructive sleep apnea (adult) (pediatric): Secondary | ICD-10-CM

## 2016-09-07 NOTE — Patient Instructions (Addendum)
Remember to drink plenty of fluid, eat healthy meals and do not skip any meals. Try to eat protein with a every meal and eat a healthy snack such as fruit or nuts in between meals. Try to keep a regular sleep-wake schedule and try to exercise daily, particularly in the form of walking, 20-30 minutes a day, if you can.   As far as diagnostic testing: Physical therapy and if no improvement then call us for Botox or injections in the muscle.   Our phone number is [phone]. We also have an after hours call service for urgent matters and there is a physician on-call for urgent questions. For any emergencies you know to call 911 or go to the nearest emergency room   Sleep Apnea Sleep apnea is a condition that affects breathing. People with sleep apnea have moments during sleep when their breathing pauses briefly or gets shallow. Sleep apnea can cause these symptoms:  Trouble staying asleep.  Sleepiness or tiredness during the day.  Irritability.  Loud snoring.  Morning headaches.  Trouble concentrating.  Forgetting things.  Less interest in sex.  Being sleepy for no reason.  Mood swings.  Personality changes.  Depression.  Waking up a lot during the night to pee (urinate).  Dry mouth.  Sore throat.  Follow these instructions at home:  Make any changes in your routine that your doctor recommends.  Eat a healthy, well-balanced diet.  Take over-the-counter and prescription medicines only as told by your doctor.  Avoid using alcohol, calming medicines (sedatives), and narcotic medicines.  Take steps to lose weight if you are overweight.  If you were given a machine (device) to use while you sleep, use it only as told by your doctor.  Do not use any tobacco products, such as cigarettes, chewing tobacco, and e-cigarettes. If you need help quitting, ask your doctor.  Keep all follow-up visits as told by your doctor. This is important. Contact a doctor if:  The  machine that you were given to use during sleep is uncomfortable or does not seem to be working.  Your symptoms do not get better.  Your symptoms get worse. Get help right away if:  Your chest hurts.  You have trouble breathing in enough air (shortness of breath).  You have an uncomfortable feeling in your back, arms, or stomach.  You have trouble talking.  One side of your body feels weak.  A part of your face is hanging down (drooping). These symptoms may be an emergency. Do not wait to see if the symptoms will go away. Get medical help right away. Call your local emergency services (911 in the [place] Do not drive yourself to the hospital. This information is not intended to replace advice given to you by your health care provider. Make sure you discuss any questions you have with your health care provider. Document Released: 11/04/2007 Document Revised: 09/21/2015 Document Reviewed: 11/04/2014 Elsevier Interactive Patient Education  [employer].

## 2016-09-07 NOTE — Progress Notes (Signed)
[name]    Provider:  Dr Jaynee Eagles Referring Provider: Binnie Rail, MD Primary Care Physician:  Binnie Rail, MD  CC: Neck pain, cervicalgia, obstructive sleep apnea  Interval history 09/07/2016: This is a 76 year old patient here for follow-up of neck pain and headache. She has obstructive sleep apnea but her CPAP was discontinued due to noncompliance, she continues having neck pain, she has been evaluated by pain management for epidural steroid injections which are difficult to due to her Plavix and also due to her diabetes he did not feel that epidural steroid injections would be appropriate. Starts on the right side and travels to the whole head causing her to have a headache and she has daily headaches however she was noncompliant with the CPAP. Headaches without photophobia or phonophobia. Mild nausea but no vomiting. She was given Flexeril in the past.  She continues to have neck pain on the right. Right trapezius is tender. Always starts behind the right ears. Tender trapezius. She wakes up with headaches daily, she has untreated sleep apnea.  She has a necl collar and a massager, she uses that, she uses heat, tried baclofen and flexeril without relief. Will order physical therapy for massage, stretching, strengthening, posture, dry needling of the right-sided cervical muscles esp trapezius. She has decreased range of motion, she has to move the whole body to twist to the right, worsening.   Addendum: patient saw dr Nelva Bush who agreed pain in the neck is arthritic. He cannot provide injections due to her plavix and also due to steroids in the injection swhich can exacerbate her insulin-dependent diabetes. Her plavix interferes with using arthritic drugs per his note. He recommended extra strength tylenol. Tramadol and codeine makes her sick. He does not have anything else to offer her.   Interval history 08/21/2015 : Haven't seen patient since 2016. I sent her for  sleep eval and she was diagnosed with sleep apnea. She may have something wrong with her machine. The pain is in the neck on the right side and goes down her neck and weakness in the right hand. The neck pain for at least a year. She has neck pain that wakes her up from sleep. She has some weakness in the right arm and hand which pre-dates the last MRI of the c-spine so repeat not needed, no new symptoms, no red flags, no changes in bowel or bladder. The pain radiates to the front. Mri of the brain last year was normal for age. 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 Will give her some flexeril at night and send for injections to Dr. Nelva Bush for pain procedure. Needs injections. Musculoskeletal neck pain on the right, palpation of the right trapezius duplicates the pain.    Reviewed MRI of the cervical spine with patient:  FINDINGS: : On sagittal images, the spine is imaged from above the cervicomedullary junction to T2-T3. The spinal cord is of normal caliber and signal. There is minimal anterolisthesis of C4 upon C5 and C5 upon C6. Degenerative changes also noted at C6-C7 with loss of disc height. The vertebral bodies have normal signal. The discs and interspaces were further evaluated on axial views from C2 to T2 as follows: C2 - C3: The disc and interspace appear normal. C3 - C4: There is left greater than right uncovertebral spurring and disc bulging. There is moderate left and mild right foraminal narrowing. Although there  is no definite nerve root compression there is some encroachment upon the exiting left C4 nerve root.. C4 - C5: There is severe right and minimal left uncovertebral spurring. There is facet hypertrophy likely causing the mild anterolisthesis. This causes moderate foraminal narrowing to the right that leads to encroachment upon the right C5 nerve root. There is only mild  foraminal narrowing to the left.. C5 - C6: There is minimal uncovertebral spurring and mild facet hypertrophy, likely contributing to the mild anterolisthesis. The neural foramina are mildly narrowed but there does not appear to be any nerve root impingement.. C6 - C7: There is moderately severe loss of disc height associated with mild disc bulging and mild uncovertebral spurring. The neural foramina are moderately narrowed bilaterally. There does not appear to be nerve root compression though there is some encroachment upon the exiting C7 nerve roots.. C7 - T1: The disc and interspace appear normal. T1 - T2: The disc and interspace appear normal  Compared to the MRI of the cervical spine dated 08/12/2004, there has been mild progression of the anterolisthesis at C4-C5 and C5-C6. Degenerative changes at C3-C4 and C6-C7 are similar.  IMPRESSION: This is an abnormal MRI of the cervical spine showing multilevel degenerative changes as detailed above. The most significant findings are: 1. Stable left greater than right uncovertebral spurring at C3-C4 causing moderate left foraminal narrowing is no nerve root compression but there is some encroachment upon the exiting left C4 nerve root.  2. Mild anterolisthesis at C4-C5 associated with mild facet hypertrophy. There is severe right uncovertebral spurring causing moderate foraminal narrowing. There is no definite nerve root compression though there is some encroachment upon the right C5 nerve root. The anterolisthesis has progressed slightly when compared to 2006. 3. Mild anterolisthesis of C5-C6 associated with mild facet hypertrophy. There does not appear to be nerve root impingement at this level. The anterolisthesis has progressed slightly when compared to 2006. 4. Disc bulging and uncovertebral spurring associated with moderately severe loss of disc height at C6-C7. There is moderate bilateral foraminal narrowing. Although there is no definite  nerve root compression, there is some encroachment upon the exiting C7 nerve roots.   HPI: [name] is a 76 y.o. female here as a referral from Dr. Karlton Lemon for gait dysfunction and fecal incontinence. PMHx of NASH, DM, hld, obesity, b12 deficiency, HTN.   She is having gait dysfunction, imbalance and bowel incontinence. She has a bad knee and had surgery on the knee but the walking problem happened before then. She had an episode with vertigo a few weeks ago and she never really recovered. She still has episodic vertigo. Worse when she doesn't get enough sleep, she has room spinning, objects moving when she is dizzy. But that doesn't happen often. She feels like she does not have balance. She fell 2 weeks ago. She says her knee gave way on the first fall. The second fall she also felt like the right knee gave out. Last week, she got up to get the phone and fell flat on her face. She is supposed to be using a walking aid and has not been using it. She does not have a walking aid with her today, she left it in the car. She denies back pain. She is having bowel incontinence, started last year. She has not been to gastrtroenterology. The stool is just there, she doesn't even know she is having leakage. She will find soft, formed stool in her underwear. She has 3-4  bowel movements a day and if she is regular she is fine, but if she miss a bowel movement in the morning she may have bowel incontinence. No pain, no urge, happens every day. She wakes up every single day with a headache. Not excessively tired during the day. No numbness or tinging in the toes. No LBP or radicular symptoms. She has neck pain. Denies new medications or laxatives.    Reviewed notes, labs and imaging from outside physicians, which showed; patient referred due to suspicion of neurologic cause of fecal incontinence.   MRI/MRA brain and head 2013 (personally reviewed images): IMPRESSION: 1. Difficult to exclude two tiny  acute or subacute cortically based infarcts in the bilateral parietal lobes (see series 4 images 21 and 24) although these could be image noise. 2. Otherwise no acute intracranial abnormality and stable/negative noncontrast MRI appearance of the brain.  IMPRESSION: 1. 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 2. Otherwise negative intracranial MRA which is intermittently degraded by motion.  BUN 17, creatinine 0.71 07/30/2014 lfts unremarkable ldl 138 hgba1c 6.8  Review of Systems: Patient complains of symptoms per HPI as well as the following symptoms: no CP, no SOB. Pertinent negatives per HPI. All others negative.   Social History   Social History  . Marital status: Widowed    Spouse name: N/A  . Number of children: 4  . Years of education: Doctorate   Occupational History  . Retired    Social History Main Topics  . Smoking status: Former Smoker    Packs/day: 0.50    Years: 21.00    Types: Cigarettes    Start date: 02/09/1955    Quit date: 02/09/1976  . Smokeless tobacco: Never Used  . Alcohol use No  . Drug use: No  . Sexual activity: Not on file   Other Topics Concern  . Not on file   Social History Narrative   Lives alone.   Caffeine use: Drinks 1 cup coffee/day      Originally from [place]. Previously has lived in [place]. Prior travel to [place], [place], [place], [place], [place], [place], [place], & [place]. Previously worked in [employer] She has a dog currently. No bird, mold, or hot tub exposure. She also pastors a church.     Family History  Problem Relation Age of Onset  . Heart disease Maternal Grandfather   . Heart failure Maternal Grandfather   . Diabetes Maternal Grandfather   . Heart attack Father   . Stomach cancer Father   . Diabetes Mother   . Rheum arthritis Sister   . Emphysema Paternal Uncle   . Esophageal cancer Brother 29       she said he was born with it    . Emphysema Paternal Aunt   . Neuropathy Neg Hx   . Multiple sclerosis Neg Hx   . Colon cancer Neg Hx     Past Medical History:  Diagnosis Date  . Acute myocardial infarction, unspecified site, episode of care unspecified   . Adenomatous colon polyp   . Anxiety   . Chest pain   . Coronary artery disease   . Diabetes mellitus   . GERD (gastroesophageal reflux disease)   . Hiatal hernia   . Hyperlipidemia   . Hypertension   . PONV (postoperative nausea and vomiting)   . Schatzki's ring   . Shoulder injury    resolved after shoulder surgery  . Unspecified  asthma(493.90)   . Upper respiratory symptom    02-13-14 "runny nose, mild cough,sniffles" -instructed to inform MD if symptoms worsens    Past Surgical History:  Procedure Laterality Date  . ABDOMINAL HYSTERECTOMY    . APPENDECTOMY     came out with Hysterectomy  . CARDIAC CATHETERIZATION  07/23/2009   EF 60%  . CARDIAC CATHETERIZATION  10/11/2008  . CARDIAC CATHETERIZATION  03/01/2007   EF 75-80%  . CARDIOVASCULAR STRESS TEST  11/15/2007   EF 60%  . COLONOSCOPY    . CORONARY ARTERY BYPASS GRAFT     SEVERELY DISEASED SAPHENOUS VEIN GRAFT TO THE RIGHT CORONARY ARTERY BUT WITH FAIRLY WELL PRESERVED FLOW TO THE DISTAL RIGHT CORONARY ARTERY FROM THE NATIVE CIRCULATION-RESTART  CATH IN JUNE 2000, REVEALS MILD/MODERATE  CAD WITH GOOD FLOW DOWN HER LAD  . ESOPHAGEAL DILATION    . ESOPHAGOGASTRODUODENOSCOPY    . EYE SURGERY     bilateral cataract surgery with lens implant  . lense removal Left   . ROTATOR CUFF REPAIR     right and left  . TONSILLECTOMY     age 29  . 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 tumor removed kidney    . US ECHOCARDIOGRAPHY  03/08/2008   EF 55-60%    Current Outpatient Prescriptions  Medication Sig Dispense Refill  . albuterol (PROVENTIL HFA;VENTOLIN HFA)  108 (90 BASE) MCG/ACT inhaler Inhale 2 puffs into the lungs every 6 (six) hours as needed. For shortness of breath. 1 Inhaler 0  . albuterol (PROVENTIL) (2.5 MG/3ML) 0.083% nebulizer solution Take 3 mLs (2.5 mg total) by nebulization every 6 (six) hours as needed for wheezing or shortness of breath. 360 mL 3  . ALPRAZolam (XANAX) 0.5 MG tablet Take 0.5 mg by mouth 2 (two) times daily as needed for anxiety.     Marland Kitchen azithromycin (ZITHROMAX) 250 MG tablet Take 2 today, then 1 daily until gone. 6 tablet 0  . baclofen (LIORESAL) 20 MG tablet Take 20 mg by mouth daily.     . benzonatate (TESSALON) 100 MG capsule Take 100 mg by mouth 3 (three) times daily as needed for cough.    . Blood Glucose Monitoring Suppl (ONE TOUCH ULTRA MINI) w/Device KIT 3 (three) times daily. for testing  0  . budesonide-formoterol (SYMBICORT) 160-4.5 MCG/ACT inhaler Inhale 2 puffs into the lungs 2 (two) times daily. 2 Inhaler 0  . Calcium & Magnesium Carbonates (MYLANTA PO) Take 1 tablet by mouth daily as needed (for indigestion).     . Cholecalciferol (VITAMIN D) 2000 UNITS tablet Take 2,000 Units by mouth daily.     . Cyanocobalamin (VITAMIN B-12 IJ) Inject as directed every 30 (thirty) days.    . cyclobenzaprine (FLEXERIL) 5 MG tablet TAKE 1 TABLET BY MOUTH AT BEDTIME 30 tablet 5  . diclofenac sodium (VOLTAREN) 1 % GEL Apply 2 g topically every 4 (four) hours.    Marland Kitchen diltiazem (CARDIZEM CD) 240 MG 24 hr capsule Take 1 capsule (240 mg total) by mouth daily. 30 capsule 10  . fexofenadine (ALLEGRA) 180 MG tablet Take 1 tablet (180 mg total) by mouth daily. 30 tablet 6  . furosemide (LASIX) 40 MG tablet Take 80 mg by mouth every other day.     . insulin lispro protamine-insulin lispro (HUMALOG 75/25) (75-25) 100 UNIT/ML SUSP Inject 22-27 Units into the skin 2 (  two) times daily with a meal. Takes 22 units in the morning and 27 units with supper    . loperamide (IMODIUM) 2 MG capsule Take 2 mg by mouth as needed for diarrhea or loose  stools.     . meclizine (ANTIVERT) 25 MG tablet Take 25 mg by mouth 2 (two) times daily as needed for dizziness.     . methocarbamol (ROBAXIN) 500 MG tablet Take 500 mg by mouth every 8 (eight) hours as needed for muscle spasms.  1  . metoprolol succinate (TOPROL-XL) 50 MG 24 hr tablet Take 1 tablet (50 mg total) by mouth daily. 30 tablet 10  . montelukast (SINGULAIR) 5 MG chewable tablet Chew 1 tablet (5 mg total) by mouth at bedtime. 30 tablet 3  . Multiple Vitamin (MULTIVITAMIN WITH MINERALS) TABS Take 1 tablet by mouth daily.    . Nebulizers (COMPRESSOR/NEBULIZER) MISC Use with albuterol 1 each 0  . nitroGLYCERIN (NITROSTAT) 0.4 MG SL tablet Place 0.4 mg under the tongue every 5 (five) minutes x 3 doses as needed for chest pain.     Marland Kitchen omeprazole (PRILOSEC) 40 MG capsule Take 1 capsule (40 mg total) by mouth 2 (two) times daily. 60 capsule 5  . ONE TOUCH ULTRA TEST test strip CHECK BLOOD SUGAR TWICE A DAY DX: E11.9  3  . ONETOUCH DELICA LANCETS 26J MISC 3 (three) times daily. for testing  0  . Pancrelipase, Lip-Prot-Amyl, 24000 units CPEP Take 2 capsules (48,000 Units total) by mouth 3 (three) times daily with meals. Take 1 capsule with a snack 210 capsule 6  . potassium chloride SA (K-DUR,KLOR-CON) 20 MEQ tablet Take 20 mEq by mouth 2 (two) times daily.    Marland Kitchen Spacer/Aero-Holding Chambers (AEROCHAMBER MV) inhaler Use as instructed 1 each 0  . isosorbide mononitrate (IMDUR) 60 MG 24 hr tablet Take 1 tablet (60 mg total) by mouth daily. 30 tablet 11   Current Facility-Administered Medications  Medication Dose Route Frequency Provider Last Rate Last Dose  . cyanocobalamin ((VITAMIN B-12)) injection 1,000 mcg  1,000 mcg Intramuscular Q30 days Binnie Rail, MD   1,000 mcg at 03/26/16 1658    Allergies as of 09/07/2016 - Review Complete 09/07/2016  Allergen Reaction Noted  . Amlodipine Other (See Comments) 08/20/2010  . Banana Nausea And Vomiting 11/01/2011  . Co q10 [coenzyme q10]    .  Codeine    . Morphine Other (See Comments)   . Statins  11/19/2015  . Sulfonamide derivatives Swelling     Vitals: BP 136/77   Pulse 90   Wt 204 lb 6.4 oz (92.7 kg)   LMP  (LMP Unknown)   BMI 37.39 kg/m  Last Weight:  Wt Readings from Last 1 Encounters:  09/07/16 204 lb 6.4 oz (92.7 kg)   Last Height:   Ht Readings from Last 1 Encounters:  08/30/16 '5\' 2"'  (1.575 m)   Physical exam: Exam: Gen: NAD, conversant, well nourised, obese, well groomed                     CV: RRR, no MRG. No Carotid Bruits. No peripheral edema, warm, nontender Eyes: Conjunctivae clear without exudates or hemorrhage MSK: Hypertrophied and tense right trapezius muscle with shoulder elevation, laterocollis and torticollis, decreased range of motion  Neuro: Detailed Neurologic Exam  Speech:    Speech is normal; fluent and spontaneous with normal comprehension.  Cognition:    The patient is oriented to person, place, and time;   Cranial Nerves:  The pupils are equal, round, and reactive to light.  Visual fields are full to finger confrontation. Extraocular movements are intact. Trigeminal sensation is intact and the muscles of mastication are normal. The face is symmetric. The palate elevates in the midline. Hearing intact. Voice is normal. Shoulder shrug is normal. The tongue has normal motion without fasciculations.   Motor Observation:    No asymmetry, no atrophy, and no involuntary movements noted. Tone:    Normal muscle tone.    Posture:    Posture is normal. normal erect    Strength:    Strength is V/V in the upper and lower limbs.      Sensation: intact to LT     Reflex Exam:    Assessment/Plan: She continues to have neck pain on the right. Right trapezius is tender to palpation, slightly hypertrophied with decreased range of motion and elevated right shoulder. Headache Always starts behind the right ear and radiates to the trapezius. She wakes up with headaches daily, most likely due  to untreated sleep apnea.  She she uses heat, tried baclofen and flexeril without relief for her muscle pain. Will order physical therapy for massage, stretching, strengthening, posture, dry needling of the right-sided cervical muscles esp trapezius. She has decreased range of motion of the neck, she has to move the whole body to twist to the right, worsening.  Discussed neck pain at length. MRI cervical spine showed multilevel degenerative changes as detailed in report and in the HPI. 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 We'll send to physical therapy as above for stretching, strengthening, posture, dry needling and any other modality as clinically warranted by the exam for her neck pain. If this does not improve with physical therapy can consider Botox injections.  Daily morning headaches: Likely due to untreated obstructive sleep apnea. Patient is following up with her sleep doctor.  Discussed:To prevent or relieve headaches, try the following: Cool Compress. Lie down and place a cool compress on your head.  Avoid headache triggers. If certain foods or odors seem to have triggered your migraines in the past, avoid them. A headache diary might help you identify triggers.  Include physical activity in your daily routine. Try a daily walk or other moderate aerobic exercise.  Manage stress. Find healthy ways to cope with the stressors, such as delegating tasks on your to-do list.  Practice relaxation techniques. Try deep breathing, yoga, massage and visualization.  Eat regularly. Eating regularly scheduled meals and maintaining a healthy diet might help prevent headaches. Also, drink plenty of fluids.  Follow a regular sleep schedule. Sleep deprivation might contribute to headaches Consider biofeedback. With this mind-body technique, you learn to control certain bodily functions - such as muscle tension, heart rate and blood pressure - to  prevent headaches or reduce headache pain.    Proceed to emergency room if you experience new or worsening symptoms or symptoms do not resolve, if you have new neurologic symptoms or if headache is severe, or for any concerning symptom.   Provided education and documentation from American headache Society toolbox including articles on: chronic migraine medication overuse headache, chronic migraines, prevention of migraines, behavioral and other nonpharmacologic treatments for headache.   Sarina Ill, MD  [facility] [address] [place], [place] [address]  Phone [phone] Fax [phone]  A total of 30 minutes was spent face-to-face with this patient. Over half this time was spent on counseling  patient on the cervical dystonia, musculoskeletal neck pain, checked his sleep apnea diagnosis and different diagnostic and therapeutic options available.

## 2016-09-09 ENCOUNTER — Ambulatory Visit (INDEPENDENT_AMBULATORY_CARE_PROVIDER_SITE_OTHER): Payer: Medicare Other | Admitting: Neurology

## 2016-09-09 ENCOUNTER — Encounter: Payer: Self-pay | Admitting: Neurology

## 2016-09-09 VITALS — BP 144/84 | HR 82 | Ht 62.0 in | Wt 204.0 lb

## 2016-09-09 DIAGNOSIS — R519 Headache, unspecified: Secondary | ICD-10-CM

## 2016-09-09 DIAGNOSIS — R351 Nocturia: Secondary | ICD-10-CM | POA: Diagnosis not present

## 2016-09-09 DIAGNOSIS — R51 Headache: Secondary | ICD-10-CM

## 2016-09-09 DIAGNOSIS — G4733 Obstructive sleep apnea (adult) (pediatric): Secondary | ICD-10-CM

## 2016-09-09 NOTE — Patient Instructions (Signed)
We will reorder a sleep study for re-qualifying you for the CPAP treatment for obstructive sleep apnea.

## 2016-09-09 NOTE — Progress Notes (Signed)
Subjective:    [name] ID: [name] is a 76 y.o. female.  HPI     Interim history:  [name] is a 76 year old right-handed woman with an underlying medical history of hyperlipidemia, CAD, s/p 3 vessel CABG, B12 deficiency, hypertension, type 2 diabetes, neck pain, NASH and obesity, who presents for reevaluation of her obstructive sleep apnea. [name] is unaccompanied today. I last saw her on 09/15/2015, at which time she reported problems with her CPAP machine. She reported that she was using it but it was not registering her CPAP usage. She was following with her pulmonologist for her severe asthma and recurrent respiratory infections.  She had an appointment in [name] interim with Ward Givens, NP, on 11/18/2015, at which time she was still not fully compliant with CPAP therapy, compliance percentage of 40% at [name] time. She was still struggling to get adjusted to treatment and wanted to continue to try. She was referred to our sleep lab for a mask refit with [name] help of our sleep lab manager. By January 2018 she was no longer on her CPAP machine because of noncompliance.  Today, 09/09/2016 (all dictated new, as well as above notes, some dictation done in note pad or Word, outside of chart, may appear as copied):  She reports that when she used her CPAP her headaches were better. She would be willing to undergo another sleep study to qualify for CPAP again. She reports that she used it regularly and she even used it trying to sit up and sleeping in her recliner to avoid taking it off at night inadvertently. She tries to be in bed around 10 and wake up time is around 5. She does have sleep disruption from nocturia about 3 times per average night. She lives alone. She drinks one cup of coffee per day on average. She has been referred to physical therapy and is waiting to hear back.  Previously (copied from previous notes for reference):    I first met her on 10/17/2014 at [name] request of  Dr. Jaynee Eagles, at which time she reported snoring, nonrestorative sleep, morning headaches, and nocturia. I invited her back for sleep study. She had a baseline sleep study, followed by a CPAP titration study. I went over her test results with her in detail today. Her baseline sleep study from 10/31/2014 showed a sleep efficiency of 83.2%, latency to sleep 39.5 minutes, wake after sleep onset of 30 minutes with mild to moderate sleep fragmentation noted. She had an increased percentage of light stage sleep, absence of slow-wave sleep and REM sleep at 19.1% with a borderline prolonged REM latency. Moderate PLMS were noted at 36.7 per hour, resulting in only 1.2 arousals per hour. She had no significant EKG or EEG changes. She had mild to moderate snoring. Overall AHI was 6.8 per hour. Average oxygen saturations 91%, nadir was 78%, time below 90% saturation was 1 hour and 17 minutes for [name] night. Based on her medical history and her sleep-related complaints as well as test results I invited her for a full night CPAP titration study. She had this on 04/14/2015. Sleep efficiency was 88.2%, sleep latency 13.5 minutes, wake after sleep onset was 33 minutes with mild to moderate sleep fragmentation noted. She had an increased percentage of stage II sleep, absence of slow-wave sleep and a mildly decreased percentage of REM sleep at 17.8% with a REM latency of 106 minutes. She had mild PLMS with an index of 16.7 per hour, resulting in only  1.6 arousals per hour. Average oxygen saturation was 91%, nadir was 84%. Time below 90% saturation was 2 hours and 27 minutes for [name] night, time below 88% saturation was 9 minutes for [name] night. CPAP was titrated from 5 cm to 9 cm. AHI was 0 per hour on [name] final pressure with supine REM sleep achieved.    I reviewed her CPAP compliance data from 08/12/2015 through 09/10/2015 which is a total of 30 days during which time she used her machine only 18 days with percent used days greater than  4 hours at only 30%, indicating poor compliance with an average usage of 2 hours and 16 minutes, residual AHI 4.5 per hour, leak at times high, 95th percentile at 22.1 L/m on a pressure of 9 cm with EPR of 3. 90 day compliance from 07/14/2015 through 09/10/2015 showed a compliance percentage of 43%, again suboptimal, residual AHI suboptimal at 7.3 per hour.   10/17/2014: She reports snoring and non-restorative sleep, nocturia of 3-4/night, as well as morning headaches for [name] past 1+ year. I reviewed your office note from 08/28/2014. She presented with a history of gait disorder, balance problems and fecal incontinence. You suggested a brain MRI without contrast, cervical spine MRI without contrast as well as lumbar spine MRI without contrast. She had these on 09/16/2014:  This is a normal age-appropriate MRI of [name] brain without contrast. There are no acute findings. Incidental note is made of a "partially empty" sella turcica, unchanged when compared to 2013.   This is an abnormal MRI of [name] cervical spine showing multilevel degenerative changes as detailed above. [name] most significant findings are: 1.  Stable left greater than right uncovertebral spurring at C3-C4 causing moderate left foraminal narrowing is no nerve root compression but there is some encroachment upon [name] exiting left C4 nerve root.   2.  Mild anterolisthesis at C4-C5 associated with mild facet hypertrophy. There is severe right uncovertebral spurring causing moderate foraminal narrowing. There is no definite nerve root compression though there is some encroachment upon [name] right C5 nerve root. [name] anterolisthesis has progressed slightly when compared to 2006. 3.  Mild anterolisthesis of C5-C6 associated with mild facet hypertrophy. There does not appear to be nerve root impingement at this level. [name] anterolisthesis has progressed slightly when compared to 2006. 4.  Disc bulging and uncovertebral spurring associated with moderately  severe loss of disc height at C6-C7. There is moderate bilateral foraminal narrowing. Although there is no definite nerve root compression, there is some encroachment upon [name] exiting C7 nerve roots.   This is an abnormal MRI of [name] lumbar spine showed [name] following: 1.   Normal conus medullaris and cauda equina. 2.   Moderate facet hypertrophy at L3-L4 with joint effusions, severe facet hypertrophy at L4-L5 with large joint effusions and moderate facet hypertrophy at L5-S1 with a small right joint effusion.  At L4-L5, there is right greater than left lateral recess stenosis. There does not appear to be any definite nerve root compression though there is some encroachment upon both of [name] traversing L5 nerve roots.  You called [name] with [name] test results and also suggested further workup through gastroenterology. Of note, she has difficulty falling asleep and staying asleep. This has been going on for years. Also, noteworthy, over 10 years ago she was diagnosed with obstructive sleep apnea and had a sleep study. Prior sleep test results are not available for my review. She was given a CPAP machine and states that she  tried it for perhaps a year but it was a struggle. She had problems with mask fitting and tolerance and finally gave up on it. She would be willing to get tested again and try CPAP again. She lives alone. She is widowed. She has 4 grown children. She has a variable sleep schedule. She has a bedtime of around 11:30 PM and has some difficulty falling asleep. She may take a sleeping pill. She is up after 3 or 4 hours. She also reports significant nocturia of 3-4 times each night. 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 She reads in bed. She really watches TV in bed. She drinks one cup of coffee in [name] morning and typically no sodas and occasional tea. She is trying to lose weight. After her knee replacement surgery in January of this year she has lost a lot of weight,  which was unintended and secondary to severe loss of appetite. She quit smoking 30 years ago. She does not drink alcohol. She denies restless leg symptoms or leg twitching at night. She had a right total shoulder replacement, left rotator cuff surgery, right knee replacement surgery and triple bypass in 2011.     Her Past Medical History Is Significant For: Past Medical History:  Diagnosis Date  . Acute myocardial infarction, unspecified site, episode of care unspecified   . Adenomatous colon polyp   . Anxiety   . Chest pain   . Coronary artery disease   . Diabetes mellitus   . GERD (gastroesophageal reflux disease)   . Hiatal hernia   . Hyperlipidemia   . Hypertension   . PONV (postoperative nausea and vomiting)   . Schatzki's ring   . Shoulder injury    resolved after shoulder surgery  . Unspecified asthma(493.90)   . Upper respiratory symptom    02-13-14 "runny nose, mild cough,sniffles" -instructed to inform MD if symptoms worsens    Her Past Surgical History Is Significant For: Past Surgical History:  Procedure Laterality Date  . ABDOMINAL HYSTERECTOMY    . APPENDECTOMY     came out with Hysterectomy  . CARDIAC CATHETERIZATION  07/23/2009   EF 60%  . CARDIAC CATHETERIZATION  10/11/2008  . CARDIAC CATHETERIZATION  03/01/2007   EF 75-80%  . CARDIOVASCULAR STRESS TEST  11/15/2007   EF 60%  . COLONOSCOPY    . CORONARY ARTERY BYPASS GRAFT     SEVERELY DISEASED SAPHENOUS VEIN GRAFT TO [name] RIGHT CORONARY ARTERY BUT WITH FAIRLY WELL PRESERVED FLOW TO [name] DISTAL RIGHT CORONARY ARTERY FROM [name] NATIVE CIRCULATION-RESTART  CATH IN JUNE 2000, REVEALS MILD/MODERATE  CAD WITH GOOD FLOW DOWN HER LAD  . ESOPHAGEAL DILATION    . ESOPHAGOGASTRODUODENOSCOPY    . EYE SURGERY     bilateral cataract surgery with lens implant  . lense removal Left   . ROTATOR CUFF REPAIR     right and left  . TONSILLECTOMY     age 52  . TOTAL KNEE ARTHROPLASTY Right 02/20/2014   Procedure: RIGHT TOTAL KNEE  ARTHROPLASTY;  Surgeon: Tobi Bastos, MD;  Location: [facility];  Service: Orthopedics;  Laterality: Right;  . tumor removed kidney    . US ECHOCARDIOGRAPHY  03/08/2008   EF 55-60%    Her Family History Is Significant For: Family History  Problem Relation Age of Onset  . Heart disease Maternal Grandfather   . Heart failure Maternal Grandfather   . Diabetes Maternal Grandfather   . Heart attack Father   .  Stomach cancer Father   . Diabetes Mother   . Rheum arthritis Sister   . Emphysema Paternal Uncle   . Esophageal cancer Brother 63       she said he was born with it  . Emphysema Paternal Aunt   . Neuropathy Neg Hx   . Multiple sclerosis Neg Hx   . Colon cancer Neg Hx     Her Social History Is Significant For: Social History   Social History  . Marital status: Widowed    Spouse name: N/A  . Number of children: 4  . Years of education: Doctorate   Occupational History  . Retired    Social History Main Topics  . Smoking status: Former Smoker    Packs/day: 0.50    Years: 21.00    Types: Cigarettes    Start date: 02/09/1955    Quit date: 02/09/1976  . Smokeless tobacco: Never Used  . Alcohol use No  . Drug use: No  . Sexual activity: Not Asked   Other Topics Concern  . None   Social History Narrative   Lives alone.   Caffeine use: Drinks 1 cup coffee/day      Originally from [place]. Previously has lived in [place]. Prior travel to [place], [place], [place], [place], [place], [place], [place], & [place]. Previously worked in [employer] She has a dog currently. No bird, mold, or hot tub exposure. She also pastors a church.     Her Allergies Are:  Allergies  Allergen Reactions  . Amlodipine Other (See Comments)    hallucinations   . Banana Nausea And Vomiting    Stomach pumped  . Co Q10 [Coenzyme Q10]     Body cramps  . Codeine     Hallucinate, loose identity and don't know who I am  . Morphine Other (See Comments)    Can not function, it immobilizes me   . Statins      Muscle cramps  . Sulfonamide Derivatives Swelling  :   Her Current Medications Are:  Outpatient Encounter Prescriptions as of 09/09/2016  Medication Sig  . albuterol (PROVENTIL HFA;VENTOLIN HFA) 108 (90 BASE) MCG/ACT inhaler Inhale 2 puffs into [name] lungs every 6 (six) hours as needed. For shortness of breath.  Marland Kitchen albuterol (PROVENTIL) (2.5 MG/3ML) 0.083% nebulizer solution Take 3 mLs (2.5 mg total) by nebulization every 6 (six) hours as needed for wheezing or shortness of breath.  . ALPRAZolam (XANAX) 0.5 MG tablet Take 0.5 mg by mouth 2 (two) times daily as needed for anxiety.   Marland Kitchen azithromycin (ZITHROMAX) 250 MG tablet Take 2 today, then 1 daily until gone.  . baclofen (LIORESAL) 20 MG tablet Take 20 mg by mouth daily.   . benzonatate (TESSALON) 100 MG capsule Take 100 mg by mouth 3 (three) times daily as needed for cough.  . Blood Glucose Monitoring Suppl (ONE TOUCH ULTRA MINI) w/Device KIT 3 (three) times daily. for testing  . budesonide-formoterol (SYMBICORT) 160-4.5 MCG/ACT inhaler Inhale 2 puffs into [name] lungs 2 (two) times daily.  . Calcium & Magnesium Carbonates (MYLANTA PO) Take 1 tablet by mouth daily as needed (for indigestion).   . Cholecalciferol (VITAMIN D) 2000 UNITS tablet Take 2,000 Units by mouth daily.   . Cyanocobalamin (VITAMIN B-12 IJ) Inject as directed every 30 (thirty) days.  . cyclobenzaprine (FLEXERIL) 5 MG tablet TAKE 1 TABLET BY MOUTH AT BEDTIME  . diclofenac sodium (VOLTAREN) 1 % GEL Apply 2 g topically every 4 (four) hours.  Marland Kitchen diltiazem (CARDIZEM  CD) 240 MG 24 hr capsule Take 1 capsule (240 mg total) by mouth daily.  . fexofenadine (ALLEGRA) 180 MG tablet Take 1 tablet (180 mg total) by mouth daily.  . furosemide (LASIX) 40 MG tablet Take 80 mg by mouth every other day.   . insulin lispro protamine-insulin lispro (HUMALOG 75/25) (75-25) 100 UNIT/ML SUSP Inject 22-27 Units into [name] skin 2 (two) times daily with a meal. Takes 22 units in [name] morning and 27 units  with supper  . loperamide (IMODIUM) 2 MG capsule Take 2 mg by mouth as needed for diarrhea or loose stools.   . meclizine (ANTIVERT) 25 MG tablet Take 25 mg by mouth 2 (two) times daily as needed for dizziness.   . methocarbamol (ROBAXIN) 500 MG tablet Take 500 mg by mouth every 8 (eight) hours as needed for muscle spasms.  . metoprolol succinate (TOPROL-XL) 50 MG 24 hr tablet Take 1 tablet (50 mg total) by mouth daily.  . montelukast (SINGULAIR) 5 MG chewable tablet Chew 1 tablet (5 mg total) by mouth at bedtime.  . Multiple Vitamin (MULTIVITAMIN WITH MINERALS) TABS Take 1 tablet by mouth daily.  . Nebulizers (COMPRESSOR/NEBULIZER) MISC Use with albuterol  . nitroGLYCERIN (NITROSTAT) 0.4 MG SL tablet Place 0.4 mg under [name] tongue every 5 (five) minutes x 3 doses as needed for chest pain.   Marland Kitchen omeprazole (PRILOSEC) 40 MG capsule Take 1 capsule (40 mg total) by mouth 2 (two) times daily.  . ONE TOUCH ULTRA TEST test strip CHECK BLOOD SUGAR TWICE A DAY DX: E11.9  . ONETOUCH DELICA LANCETS 72Z MISC 3 (three) times daily. for testing  . Pancrelipase, Lip-Prot-Amyl, 24000 units CPEP Take 2 capsules (48,000 Units total) by mouth 3 (three) times daily with meals. Take 1 capsule with a snack  . potassium chloride SA (K-DUR,KLOR-CON) 20 MEQ tablet Take 20 mEq by mouth 2 (two) times daily.  Marland Kitchen Spacer/Aero-Holding Chambers (AEROCHAMBER MV) inhaler Use as instructed  . isosorbide mononitrate (IMDUR) 60 MG 24 hr tablet Take 1 tablet (60 mg total) by mouth daily.   Facility-Administered Encounter Medications as of 09/09/2016  Medication  . cyanocobalamin ((VITAMIN B-12)) injection 1,000 mcg  :  Review of Systems:  Out of a complete 14 point review of systems, all are reviewed and negative with [name] exception of these symptoms as listed below:  Review of Systems  Neurological:       Pt presents today to discuss her cpap. Pt says that she did not have headaches when she used her cpap. Her insurance did not  allow for her to continue cpap because she was non compliant, although [name] pt says that she was compliant.    Objective:  Neurological Exam  Physical Exam Physical Examination:   Vitals:   09/09/16 1518  BP: (!) 144/84  Pulse: 82   General Examination: [name] is a very pleasant 76 y.o. female in no acute distress. She appears well-developed and well-nourished and well groomed.   HEENT: Normocephalic, atraumatic, pupils are equal, round and reactive to light and accommodation. Extraocular tracking is good without limitation to gaze excursion or nystagmus noted. Normal smooth pursuit is noted. Hearing is grossly intact. Face is symmetric with normal facial animation and normal facial sensation. Speech is clear with no dysarthria noted. There is no hypophonia. There is no lip, neck/head, jaw or voice tremor. Neck is supple with full range of passive and active motion. There are no carotid bruits on auscultation. Oropharynx exam reveals: severe mouth dryness,  upper and lower full dentures in place, and moderate airway crowding, due to large tongue, large uvula. Mallampati is class II. Tongue protrudes centrally and palate elevates symmetrically. Tonsils are absent. Neck circumference is 16 inches.    Chest: Clear to auscultation without wheezing, rhonchi or crackles noted. She has an unremarkable midline sternotomy scar.   Heart: S1+S2+0, regular and normal without murmurs, rubs or gallops noted.   Abdomen: Soft, non-tender and non-distended with normal bowel sounds appreciated on auscultation.  Extremities: There is no pitting edema in [name] distal lower extremities bilaterally. Pedal pulses are intact.  Skin: Warm and dry without trophic changes noted. Very long/artificial nails.  Musculoskeletal: exam is unchanged.     Neurologically:  Mental status: [name] is awake, alert and oriented in all 4 spheres. Her immediate and remote memory, attention, language skills and fund  of knowledge are appropriate. There is no evidence of aphasia, agnosia, apraxia or anomia. Speech is clear with normal prosody and enunciation. Thought process is linear. Mood is normal and affect is normal.  Cranial nerves II - XII are as described above under HEENT exam. In addition: shoulder shrug is normal with equal shoulder height noted. Motor exam: Normal bulk, strength and tone is noted. Reflexes are 1+. Fine motor skills and coordination: grossly intact.  Cerebellar testing: No dysmetria or intention tremor. There is no truncal or gait ataxia.  Sensory exam: intact to light touch in [name] upper and lower extremities.  Gait, station and balance: She stands with mild difficulty. Posture is age-appropriate and stance is narrow based. Gait shows normal stride length and normal pace. She has mild insecurity with turns.  Assessment and plan:   In summary, [name] is a 76 year old female with an underlying medical history of hyperlipidemia, CAD, s/p 3 vessel CABG, B12 deficiency, hypertension, type 2 diabetes, neck pain, NASH and obesity, who presents for re-consultation of her obstructive sleep apnea, after she lost insurance coverage for his CPAP machine last year. She had a prior diagnosis of OSA and difficulty with CPAP tolerance many years ago. We then proceeded with baseline sleep study testing and CPAP titration in September 2016 and March 2017, respectively. While she did have difficulty adjusting to CPAP therapy. She feels like she had improvement of her recurrent headaches after being on it. She would be willing to proceed with repeat sleep study testing to be able to recall 5 for treatment with CPAP. She is reminded that it is imperative that she be compliant with CPAP therapy as we are repeating testing yet another time. She is agreeable and demonstrates understanding. She does report that she had trouble communicating with [name] DME company and that she was trying to understand what she  was doing wrong as she felt she was compliant with treatment. Unfortunately, I do not have further insight into this matter as her compliance data was not suggesting compliance. As it stands, she does not have a CPAP machine any longer and we will proceed with sleep study testing again. I answered all her questions today and she is in agreement.  I spent 20 minutes in total face-to-face time with [name], more than 50% of which was spent in counseling and coordination of care, reviewing test results, reviewing medication and discussing or reviewing [name] diagnosis of OSA, its prognosis and treatment options. Pertinent laboratory and imaging test results that were available during this visit with [name] were reviewed by me and considered in my medical decision making (see  chart for details).

## 2016-09-21 ENCOUNTER — Ambulatory Visit (INDEPENDENT_AMBULATORY_CARE_PROVIDER_SITE_OTHER): Payer: Medicare Other | Admitting: Cardiovascular Disease

## 2016-09-21 ENCOUNTER — Encounter: Payer: Self-pay | Admitting: Cardiovascular Disease

## 2016-09-21 VITALS — BP 110/60 | HR 72 | Ht 62.0 in | Wt 184.2 lb

## 2016-09-21 DIAGNOSIS — I2510 Atherosclerotic heart disease of native coronary artery without angina pectoris: Secondary | ICD-10-CM

## 2016-09-21 LAB — LIPID PANEL
CHOLESTEROL TOTAL: 190 mg/dL (ref 100–199)
Chol/HDL Ratio: 5.3 ratio — ABNORMAL HIGH (ref 0.0–4.4)
HDL: 36 mg/dL — ABNORMAL LOW (ref >39)
LDL CALC: 133 mg/dL — AB (ref 0–99)
Triglycerides: 105 mg/dL (ref 0–149)
VLDL Cholesterol Cal: 21 mg/dL (ref 5–40)

## 2016-09-21 LAB — BASIC METABOLIC PANEL
BUN / CREAT RATIO: 23 (ref 12–28)
BUN: 18 mg/dL (ref 8–27)
CHLORIDE: 103 mmol/L (ref 96–106)
CO2: 27 mmol/L (ref 20–29)
Calcium: 9.1 mg/dL (ref 8.7–10.3)
Creatinine, Ser: 0.77 mg/dL (ref 0.57–1.00)
GFR calc Af Amer: 86 mL/min/{1.73_m2} (ref >59)
GFR calc non Af Amer: 75 mL/min/{1.73_m2} (ref >59)
GLUCOSE: 163 mg/dL — AB (ref 65–99)
Potassium: 4.2 mmol/L (ref 3.5–5.2)
SODIUM: 144 mmol/L (ref 134–144)

## 2016-09-21 LAB — HEPATIC FUNCTION PANEL
ALK PHOS: 151 IU/L — AB (ref 39–117)
ALT: 11 IU/L (ref 0–32)
AST: 17 IU/L (ref 0–40)
Albumin: 4 g/dL (ref 3.5–4.8)
BILIRUBIN, DIRECT: 0.09 mg/dL (ref 0.00–0.40)
Bilirubin Total: 0.3 mg/dL (ref 0.0–1.2)
Total Protein: 6.3 g/dL (ref 6.0–8.5)

## 2016-09-21 MED ORDER — NITROGLYCERIN 0.4 MG SL SUBL: 0.4000 mg | SUBLINGUAL_TABLET | SUBLINGUAL | 6 refills | Status: DC | PRN

## 2016-09-21 NOTE — Progress Notes (Signed)
Cardiology Office Note   Date:  09/21/2016   ID:  DAJANEE VOORHEIS, DOB 1940-10-07, MRN [id number]  PCP:  Binnie Rail, MD  Cardiologist:   Mertie Moores, MD   Chief Complaint  Patient presents with  . Coronary Artery Disease   1. Coronary artery disease-status post CABG, the saphenous vein graft to the right coronary artery is severely diseased but she has good flow to the distal right coronary artery from the native circulation. 2. Diabetes mellitus 3. Hypertension 4. Hyperlipidemia 5. COPD: June, 2013 - FEV1 equals 0.89 L which is 48% of predicted value. Her DLCO is also markedly depressed. 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   Ms. Kupfer is a 76 y.o. female with the above noted hx. She continues to have episodes of sharp pain ( few seconds) followed by a burning pain ( last 6-7 minutes). She usually does not take NTG because it causes a headache. She was started on Lasix earlier this year. She seems to be doing much better on Lasix. She's had only function test which revealed severe obstructive lung disease with a good initial response to bronchodilators.  She is scheduled to have surgery all right knee. She's here today for preoperative evaluation prior to having the surgery.  September 04, 2012:  Marisol is seen today for a follow up visit. She saw Richardson Dopp in April and was started on bystolic. She has tolerated that fairly well. She has some episodes of CP that are constant. She is very stable.   03/01/2013:  She continues to have significant CP. Still short of breath. She has no energy. Just does not feel well in general.  She does not get much sleep - perhaps 3 hours a night.   Jan. 8, 2016:  Shanise is a 76 yo who we follow for CAD, DM, HTN, hyperlipidemia, . She has COPD: FEV1 equals 0.89 L which is 48% of predicted value. Her DLCO is also markedly depressed. She has severe obstructive lung disease with a  beneficial response to bronchodilators.  Breathing is the same. Has some upper respiratory issues.  Still has some tightness.    Jun 17, 2014:    BRENLYNN FAKE is a 76 y.o. female who presents for episodes of CP    Started 1 1/2 months ago.  Has tried mylanta - thought it was gas.  Did not get any better Occurs wjith exertion or at rest. , left sided chest pain   No radiation Gets lightheaded. Has tried NTG which seems to help.    Nov. 15 2016:  Has not been feeling well for the past couple of months Has a chest pressure when she gets out of bed.  Occurs twice a day - in the AM when she gets up  And then again at 3:30 in the afternoon.   Is active between those times , makes home visit.   Does water aerobics without any CP / pressure.   Wears home O2 at night.     Jun 23, 2015: Doing the same.  Still has the same chest pain  Last cath was in 2014 - showed patent LIMA to LAD, SVG to D1, the SVG to RCA was occluded   Native RCA has a 40-50% prox stenosis   The pains occur typically twice a day - Upon awakening and around 7 PM.   Nov. 20, 2017:  Has had lots of fatigue / pressure in her chest .  Increased shortness of breath walking a short distance  Has COPD - had issues with bronchitis ,  Was on prednisone  Pain was relieved with NTG and also by taking additional aspirin   April 08, 2016:  She has had some GI bleeding from diverticulitis  Was in the hospital for 4 days  Has been seeing Dr. Fuller Plan.   Aug. 14, 2018: Lavell is seen today for follow up    GI bleeding has resolved  Breathing is still an issue.  Feet are swollen by the end of the day  ( no swelling this am )    Past Medical History:  Diagnosis Date  . Acute myocardial infarction, unspecified site, episode of care unspecified   . Adenomatous colon polyp   . Anxiety   . Chest pain   . Coronary artery disease   . Diabetes mellitus   . GERD (gastroesophageal reflux disease)   . Hiatal hernia   .  Hyperlipidemia   . Hypertension   . PONV (postoperative nausea and vomiting)   . Schatzki's ring   . Shoulder injury    resolved after shoulder surgery  . Unspecified asthma(493.90)   . Upper respiratory symptom    02-13-14 "runny nose, mild cough,sniffles" -instructed to inform MD if symptoms worsens    Past Surgical History:  Procedure Laterality Date  . ABDOMINAL HYSTERECTOMY    . APPENDECTOMY     came out with Hysterectomy  . CARDIAC CATHETERIZATION  07/23/2009   EF 60%  . CARDIAC CATHETERIZATION  10/11/2008  . CARDIAC CATHETERIZATION  03/01/2007   EF 75-80%  . CARDIOVASCULAR STRESS TEST  11/15/2007   EF 60%  . COLONOSCOPY    . CORONARY ARTERY BYPASS GRAFT     SEVERELY DISEASED SAPHENOUS VEIN GRAFT TO THE RIGHT CORONARY ARTERY BUT WITH FAIRLY WELL PRESERVED FLOW TO THE DISTAL RIGHT CORONARY ARTERY FROM THE NATIVE CIRCULATION-RESTART  CATH IN JUNE 2000, REVEALS MILD/MODERATE  CAD WITH GOOD FLOW DOWN HER LAD  . ESOPHAGEAL DILATION    . ESOPHAGOGASTRODUODENOSCOPY    . EYE SURGERY     bilateral cataract surgery with lens implant  . lense removal Left   . ROTATOR CUFF REPAIR     right and left  . TONSILLECTOMY     age 57  . 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 tumor removed kidney    . US ECHOCARDIOGRAPHY  03/08/2008   EF 55-60%     Current Outpatient Prescriptions  Medication Sig Dispense Refill  . albuterol (PROVENTIL HFA;VENTOLIN HFA) 108 (90 BASE) MCG/ACT inhaler Inhale 2 puffs into the lungs every 6 (six) hours as needed. For shortness of breath. 1 Inhaler 0  . albuterol (PROVENTIL) (2.5 MG/3ML) 0.083% nebulizer solution Take 3 mLs (2.5 mg total) by nebulization every 6 (six) hours as needed for wheezing or shortness of breath. 360 mL 3  . ALPRAZolam (XANAX) 0.5 MG tablet Take 0.5 mg by mouth 2 (two) times daily as needed for  anxiety.     Marland Kitchen azithromycin (ZITHROMAX) 250 MG tablet Take 2 today, then 1 daily until gone. 6 tablet 0  . baclofen (LIORESAL) 20 MG tablet Take 20 mg by mouth daily.     . benzonatate (TESSALON) 100 MG capsule Take 100 mg by mouth 3 (three) times daily as needed for cough.    . Blood Glucose Monitoring Suppl (ONE  TOUCH ULTRA MINI) w/Device KIT 3 (three) times daily. for testing  0  . budesonide-formoterol (SYMBICORT) 160-4.5 MCG/ACT inhaler Inhale 2 puffs into the lungs 2 (two) times daily. 2 Inhaler 0  . Calcium & Magnesium Carbonates (MYLANTA PO) Take 1 tablet by mouth daily as needed (for indigestion).     . Cholecalciferol (VITAMIN D) 2000 UNITS tablet Take 2,000 Units by mouth daily.     . Cyanocobalamin (VITAMIN B-12 IJ) Inject as directed every 30 (thirty) days.    . cyclobenzaprine (FLEXERIL) 5 MG tablet TAKE 1 TABLET BY MOUTH AT BEDTIME 30 tablet 5  . diclofenac sodium (VOLTAREN) 1 % GEL Apply 2 g topically every 4 (four) hours.    Marland Kitchen diltiazem (CARDIZEM CD) 240 MG 24 hr capsule Take 1 capsule (240 mg total) by mouth daily. 30 capsule 10  . fexofenadine (ALLEGRA) 180 MG tablet Take 1 tablet (180 mg total) by mouth daily. 30 tablet 6  . furosemide (LASIX) 40 MG tablet Take 80 mg by mouth every other day.     . insulin lispro protamine-insulin lispro (HUMALOG 75/25) (75-25) 100 UNIT/ML SUSP Inject 22-27 Units into the skin 2 (two) times daily with a meal. Takes 22 units in the morning and 27 units with supper    . loperamide (IMODIUM) 2 MG capsule Take 2 mg by mouth as needed for diarrhea or loose stools.     . meclizine (ANTIVERT) 25 MG tablet Take 25 mg by mouth 2 (two) times daily as needed for dizziness.     . methocarbamol (ROBAXIN) 500 MG tablet Take 500 mg by mouth every 8 (eight) hours as needed for muscle spasms.  1  . metoprolol succinate (TOPROL-XL) 50 MG 24 hr tablet Take 1 tablet (50 mg total) by mouth daily. 30 tablet 10  . montelukast (SINGULAIR) 5 MG chewable tablet Chew 1  tablet (5 mg total) by mouth at bedtime. 30 tablet 3  . Multiple Vitamin (MULTIVITAMIN WITH MINERALS) TABS Take 1 tablet by mouth daily.    . Nebulizers (COMPRESSOR/NEBULIZER) MISC Use with albuterol 1 each 0  . nitroGLYCERIN (NITROSTAT) 0.4 MG SL tablet Place 0.4 mg under the tongue every 5 (five) minutes x 3 doses as needed for chest pain.     Marland Kitchen omeprazole (PRILOSEC) 40 MG capsule Take 1 capsule (40 mg total) by mouth 2 (two) times daily. 60 capsule 5  . ONE TOUCH ULTRA TEST test strip CHECK BLOOD SUGAR TWICE A DAY DX: E11.9  3  . ONETOUCH DELICA LANCETS 78G MISC 3 (three) times daily. for testing  0  . Pancrelipase, Lip-Prot-Amyl, 24000 units CPEP Take 2 capsules (48,000 Units total) by mouth 3 (three) times daily with meals. Take 1 capsule with a snack 210 capsule 6  . potassium chloride SA (K-DUR,KLOR-CON) 20 MEQ tablet Take 20 mEq by mouth 2 (two) times daily.    Marland Kitchen Spacer/Aero-Holding Chambers (AEROCHAMBER MV) inhaler Use as instructed 1 each 0  . isosorbide mononitrate (IMDUR) 60 MG 24 hr tablet Take 1 tablet (60 mg total) by mouth daily. 30 tablet 11   Current Facility-Administered Medications  Medication Dose Route Frequency Provider Last Rate Last Dose  . cyanocobalamin ((VITAMIN B-12)) injection 1,000 mcg  1,000 mcg Intramuscular Q30 days Binnie Rail, MD   1,000 mcg at 03/26/16 1658    Allergies:   Amlodipine; Banana; Co q10 [coenzyme q10]; Codeine; Morphine; Statins; and Sulfonamide derivatives    Social History:  The patient  reports that she quit smoking about 40  years ago. Her smoking use included Cigarettes. She started smoking about 61 years ago. She has a 10.50 pack-year smoking history. She has never used smokeless tobacco. She reports that she does not drink alcohol or use drugs.   Family History:  The patient's family history includes Diabetes in her maternal grandfather and mother; Emphysema in her paternal aunt and paternal uncle; Esophageal cancer (age of onset: 5)  in her brother; Heart attack in her father; Heart disease in her maternal grandfather; Heart failure in her maternal grandfather; Rheum arthritis in her sister; Stomach cancer in her father.    ROS:  Please see the history of present illness.    Review of Systems: Constitutional:  denies fever, chills, diaphoresis, appetite change and fatigue.  HEENT: denies photophobia, eye pain, redness, hearing loss, ear pain, congestion, sore throat, rhinorrhea, sneezing, neck pain, neck stiffness and tinnitus.  Respiratory: denies SOB, DOE, cough, chest tightness, and wheezing.  Cardiovascular: denies chest pain, palpitations and mild  leg swelling.  Gastrointestinal: denies nausea, vomiting, abdominal pain, diarrhea, constipation, blood in stool.  Genitourinary: denies dysuria, urgency, frequency, hematuria, flank pain and difficulty urinating.  Musculoskeletal: denies  myalgias, back pain, joint swelling, arthralgias and gait problem.   Skin: denies pallor, rash and wound.  Neurological: denies dizziness, seizures, syncope, weakness, light-headedness, numbness and headaches.   Hematological: denies adenopathy, easy bruising, personal or family bleeding history.  Psychiatric/ Behavioral: denies suicidal ideation, mood changes, confusion, nervousness, sleep disturbance and agitation.       All other systems are reviewed and negative.    PHYSICAL EXAM: VS:  BP 110/60   Pulse 72   Ht '5\' 2"'  (1.575 m)   Wt 184 lb 3.2 oz (83.6 kg)   LMP  (LMP Unknown)   BMI 33.69 kg/m  , BMI Body mass index is 33.69 kg/m. GEN: Well nourished, well developed, in no acute distress  HEENT: normal  Neck: no JVD, carotid bruits, or masses Cardiac: RRR; no murmurs, rubs, or gallops,no edema  Respiratory:  clear to auscultation bilaterally, normal work of breathing GI: soft, nontender, nondistended, + BS MS: no deformity or atrophy  Skin: warm and dry, no rash Neuro:  Strength and sensation are intact Psych:  normal   EKG:  EKG is ordered today. The ekg ordered today demonstrates  NSR at 86.  RBBB.    Recent Labs: 05/19/2016: ALT 11; TSH 1.25 06/21/2016: BUN 18; Creatinine, Ser 0.75; Hemoglobin 10.9; NT-Pro BNP 128; Platelets 293; Potassium 4.5; Sodium 143 06/28/2016: Magnesium 2.0    Lipid Panel    Component Value Date/Time   CHOL 161 10/06/2011 0500   TRIG 109 10/06/2011 0500   HDL 37 (L) 10/06/2011 0500   CHOLHDL 4.4 10/06/2011 0500   VLDL 22 10/06/2011 0500   LDLCALC 102 (H) 10/06/2011 0500      Wt Readings from Last 3 Encounters:  09/21/16 184 lb 3.2 oz (83.6 kg)  09/09/16 204 lb (92.5 kg)  09/07/16 204 lb 6.4 oz (92.7 kg)      Other studies Reviewed: Additional studies/ records that were reviewed today include: . Review of the above records demonstrates:    ASSESSMENT AND PLAN:  1. Coronary artery disease-status post CABG, the saphenous vein graft to the right coronary artery is severely diseased but she has good flow to the distal right coronary artery from the native circulation.    2. Diabetes mellitus 3. Hypertension - she is doing well off the Cardura  4. Hyperlipidemia 5. COPD: June, 2013 -  FEV1 equals 0.89 L which is 48% of predicted value. Her DLCO is also markedly depressed. 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   Current medicines are reviewed at length with the patient today.  The patient does not have concerns regarding medicines.  The following changes have been made:  no change  Labs/ tests ordered today include:  No orders of the defined types were placed in this encounter.    Disposition:   FU with me in 6 months       Mertie Moores, MD  09/21/2016 8:28 AM    [address], [place], [place]  [address] Phone: [phone]; Fax: [phone]

## 2016-09-21 NOTE — Patient Instructions (Signed)
Medication Instructions:  Your physician recommends that you continue on your current medications as directed. Please refer to the Current Medication list given to you today.   Labwork: TODAY - cholesterol, bmet, liver panel   Testing/Procedures: None Ordered   Follow-Up: Your physician wants you to follow-up in: 6 months with Dr. Acie Fredrickson.  You will receive a reminder letter in the mail two months in advance. If you don't receive a letter, please call our office to schedule the follow-up appointment.   If you need a refill on your cardiac medications before your next appointment, please call your pharmacy.   Thank you for choosing [facility]! Christen Bame, RN [phone]

## 2016-09-22 ENCOUNTER — Encounter: Payer: Self-pay | Admitting: Acute Care

## 2016-09-22 ENCOUNTER — Ambulatory Visit (INDEPENDENT_AMBULATORY_CARE_PROVIDER_SITE_OTHER): Payer: Medicare Other | Admitting: Acute Care

## 2016-09-22 VITALS — BP 124/62 | HR 68 | Ht 62.0 in | Wt 207.4 lb

## 2016-09-22 DIAGNOSIS — K219 Gastro-esophageal reflux disease without esophagitis: Secondary | ICD-10-CM

## 2016-09-22 DIAGNOSIS — R0602 Shortness of breath: Secondary | ICD-10-CM

## 2016-09-22 DIAGNOSIS — J4550 Severe persistent asthma, uncomplicated: Secondary | ICD-10-CM | POA: Diagnosis not present

## 2016-09-22 NOTE — Patient Instructions (Addendum)
I am glad you are feeling better. Continue your Symbicort and Spiriva  as you have been doing. Continue your Allegra  daily. Continue your Prilosec daily as you have been doing. Continue your nebulizer treatments twice daily or as needed up to 4 times daily for shortness of breath or wheezing. Follow up with Dr. Ashok Cordia in 3 months with PFT's prior. Please contact office for sooner follow up if symptoms do not improve or worsen or seek emergency care

## 2016-09-22 NOTE — Assessment & Plan Note (Signed)
Resolution of mild asthma exacerbation Completed Z-Pak and prednisone taper Clinically nontoxic appearing Plan Continue your Symbicort and Spiriva  as you have been doing. Continue your Allegra  daily. Continue your Prilosec daily as you have been doing. Continue your nebulizer treatments twice daily or as needed up to 4 times daily for shortness of breath or wheezing. Follow up with Dr. Ashok Cordia in 3 months with PFT's prior. Please contact office for sooner follow up if symptoms do not improve or worsen or seek emergency care

## 2016-09-22 NOTE — Assessment & Plan Note (Signed)
Continue daily medication regimen

## 2016-09-22 NOTE — Progress Notes (Signed)
History of Present Illness [name] is a 76 y.o. female former smoker with persistent asthma, and fixed airway obstruction. She is followed by Dr. Ashok Cordia  HPI Severe, persistent asthma with fixed airway obstruction: Patient given samples of Perforomist and Pulmicort inhaler at last appointment. She has a new, intermittent, nonproductive cough. She has noticed more wheezing as well. She is waking up at night with coughing as well. She has noticed more symptoms since the heat &humidity have increased. She has been usin gher rescue inhaler more frequently and has ran out of it. She has also been unable to afford her nebulizer medications for about a month.   Chronic allergic rhinitis: Prescribed Singulair &Allegra. She has also noticed increase sinus congestion &drainage as well despite adherence to her medications.   09/22/2016 Follow up OV: Pt. Presents for follow up. She was seen 08/30/2016 She states she completed her Z pack and her prednisone taper. She states she feels better. She has been compliant with her proventil nebs.She is compliant with her allegra.She is compliant with her Tessalon tabs as needed . She is compliant with her maintenance Symbicort and Singulair. She denies any chest pain,fever, orthopnea or hemoptysis. She states she has some intermittent  wheezing. She has been using her Proventil nebs as needed. She states she does have some exertional dyspnea, which she currently attributes to the weather and humidity.   Test Results:  CBC Latest Ref Rng & Units 06/21/2016 05/19/2016 03/26/2016  WBC 3.4 - 10.8 x10E3/uL 6.6 6.0 6.7  Hemoglobin 11.1 - 15.9 g/dL 10.9(L) 12.0 10.4(L)  Hematocrit 34.0 - 46.6 % 35.5 37.7 31.9(L)  Platelets 150 - 379 x10E3/uL 293 276.0 276.0    BMP Latest Ref Rng & Units 09/21/2016 06/21/2016 05/19/2016  Glucose 65 - 99 mg/dL 163(H) 188(H) 178(H)  BUN 8 - 27 mg/dL '18 18 20  ' Creatinine 0.57 - 1.00 mg/dL 0.77 0.75 0.69  BUN/Creat Ratio 12 - '28 23  24 ' -  Sodium 134 - 144 mmol/L 144 143 139  Potassium 3.5 - 5.2 mmol/L 4.2 4.5 4.4  Chloride 96 - 106 mmol/L 103 100 101  CO2 20 - 29 mmol/L 27 23 32  Calcium 8.7 - 10.3 mg/dL 9.1 8.9 9.2     ProBNP    Component Value Date/Time   PROBNP 128 06/21/2016 1628   PROBNP 158.0 (H) 04/17/2012 1123    PFT    Component Value Date/Time   FEV1PRE 1.01 06/04/2015 1301   FEV1POST 1.12 06/04/2015 1301   FVCPRE 1.57 06/04/2015 1301   FVCPOST 1.63 06/04/2015 1301   TLC 3.92 11/20/2014 1121   DLCOUNC 14.72 11/20/2014 1121   PREFEV1FVCRT 65 06/04/2015 1301   PSTFEV1FVCRT 69 06/04/2015 1301    Dg Chest 2 View  Result Date: 08/30/2016 CLINICAL DATA:  Cough, wheezing and dyspnea for 5 days. History of MI, coronary artery disease, diabetes, hypertension, asthma. EXAM: CHEST  2 VIEW COMPARISON:  Chest x-ray dated 03/13/2015. FINDINGS: Heart size is upper normal, stable. Overall cardiomediastinal silhouette is stable. Patient is status post median sternotomy for presumed CABG. Aortic atherosclerosis. Lungs are clear. Lung volumes are stable. No evidence of pneumonia. No pleural effusion or pneumothorax seen. No acute or suspicious osseous finding. IMPRESSION: 1. No active cardiopulmonary disease. No evidence of pneumonia or pulmonary edema. 2. Aortic atherosclerosis. Electronically Signed   By: Franki Cabot M.D.   On: 08/30/2016 14:02     Past medical hx Past Medical History:  Diagnosis Date  . Acute myocardial infarction,  unspecified site, episode of care unspecified   . Adenomatous colon polyp   . Anxiety   . Chest pain   . Coronary artery disease   . Diabetes mellitus   . GERD (gastroesophageal reflux disease)   . Hiatal hernia   . Hyperlipidemia   . Hypertension   . PONV (postoperative nausea and vomiting)   . Schatzki's ring   . Shoulder injury    resolved after shoulder surgery  . Unspecified asthma(493.90)   . Upper respiratory symptom    02-13-14 "runny nose, mild cough,sniffles"  -instructed to inform MD if symptoms worsens     Social History  Substance Use Topics  . Smoking status: Former Smoker    Packs/day: 0.50    Years: 21.00    Types: Cigarettes    Start date: 02/09/1955    Quit date: 02/09/1976  . Smokeless tobacco: Never Used  . Alcohol use No    Ms.Pulley reports that she quit smoking about 40 years ago. Her smoking use included Cigarettes. She started smoking about 61 years ago. She has a 10.50 pack-year smoking history. She has never used smokeless tobacco. She reports that she does not drink alcohol or use drugs.  Tobacco Cessation: Previous smoker quit in 1978  Past surgical hx, Family hx, Social hx all reviewed.  Current Outpatient Prescriptions on File Prior to Visit  Medication Sig  . albuterol (PROVENTIL HFA;VENTOLIN HFA) 108 (90 BASE) MCG/ACT inhaler Inhale 2 puffs into the lungs every 6 (six) hours as needed. For shortness of breath.  Marland Kitchen albuterol (PROVENTIL) (2.5 MG/3ML) 0.083% nebulizer solution Take 3 mLs (2.5 mg total) by nebulization every 6 (six) hours as needed for wheezing or shortness of breath.  . ALPRAZolam (XANAX) 0.5 MG tablet Take 0.5 mg by mouth 2 (two) times daily as needed for anxiety.   . baclofen (LIORESAL) 20 MG tablet Take 20 mg by mouth daily.   . benzonatate (TESSALON) 100 MG capsule Take 100 mg by mouth 3 (three) times daily as needed for cough.  . Blood Glucose Monitoring Suppl (ONE TOUCH ULTRA MINI) w/Device KIT 3 (three) times daily. for testing  . budesonide-formoterol (SYMBICORT) 160-4.5 MCG/ACT inhaler Inhale 2 puffs into the lungs 2 (two) times daily.  . Calcium & Magnesium Carbonates (MYLANTA PO) Take 1 tablet by mouth daily as needed (for indigestion).   . Cholecalciferol (VITAMIN D) 2000 UNITS tablet Take 2,000 Units by mouth daily.   . Cyanocobalamin (VITAMIN B-12 IJ) Inject as directed every 30 (thirty) days.  . cyclobenzaprine (FLEXERIL) 5 MG tablet TAKE 1 TABLET BY MOUTH AT BEDTIME  . diclofenac sodium  (VOLTAREN) 1 % GEL Apply 2 g topically every 4 (four) hours.  Marland Kitchen diltiazem (CARDIZEM CD) 240 MG 24 hr capsule Take 1 capsule (240 mg total) by mouth daily.  . fexofenadine (ALLEGRA) 180 MG tablet Take 1 tablet (180 mg total) by mouth daily.  . furosemide (LASIX) 40 MG tablet Take 80 mg by mouth every other day.   . insulin lispro protamine-insulin lispro (HUMALOG 75/25) (75-25) 100 UNIT/ML SUSP Inject 22-27 Units into the skin 2 (two) times daily with a meal. Takes 22 units in the morning and 27 units with supper  . loperamide (IMODIUM) 2 MG capsule Take 2 mg by mouth as needed for diarrhea or loose stools.   . meclizine (ANTIVERT) 25 MG tablet Take 25 mg by mouth 2 (two) times daily as needed for dizziness.   . methocarbamol (ROBAXIN) 500 MG tablet Take 500 mg by mouth  every 8 (eight) hours as needed for muscle spasms.  . metoprolol succinate (TOPROL-XL) 50 MG 24 hr tablet Take 1 tablet (50 mg total) by mouth daily.  . montelukast (SINGULAIR) 5 MG chewable tablet Chew 1 tablet (5 mg total) by mouth at bedtime.  . Multiple Vitamin (MULTIVITAMIN WITH MINERALS) TABS Take 1 tablet by mouth daily.  . Nebulizers (COMPRESSOR/NEBULIZER) MISC Use with albuterol  . nitroGLYCERIN (NITROSTAT) 0.4 MG SL tablet Place 1 tablet (0.4 mg total) under the tongue every 5 (five) minutes x 3 doses as needed for chest pain.  Marland Kitchen omeprazole (PRILOSEC) 40 MG capsule Take 1 capsule (40 mg total) by mouth 2 (two) times daily.  . ONE TOUCH ULTRA TEST test strip CHECK BLOOD SUGAR TWICE A DAY DX: E11.9  . ONETOUCH DELICA LANCETS 98X MISC 3 (three) times daily. for testing  . Pancrelipase, Lip-Prot-Amyl, 24000 units CPEP Take 2 capsules (48,000 Units total) by mouth 3 (three) times daily with meals. Take 1 capsule with a snack  . potassium chloride SA (K-DUR,KLOR-CON) 20 MEQ tablet Take 20 mEq by mouth 2 (two) times daily.  Marland Kitchen Spacer/Aero-Holding Chambers (AEROCHAMBER MV) inhaler Use as instructed  . isosorbide mononitrate (IMDUR)  60 MG 24 hr tablet Take 1 tablet (60 mg total) by mouth daily.   Current Facility-Administered Medications on File Prior to Visit  Medication  . cyanocobalamin ((VITAMIN B-12)) injection 1,000 mcg     Allergies  Allergen Reactions  . Amlodipine Other (See Comments)    hallucinations   . Banana Nausea And Vomiting    Stomach pumped  . Co Q10 [Coenzyme Q10]     Body cramps  . Codeine     Hallucinate, loose identity and don't know who I am  . Morphine Other (See Comments)    Can not function, it immobilizes me   . Statins     Muscle cramps  . Sulfonamide Derivatives Swelling    Review Of Systems:  Constitutional:   No  weight loss, night sweats,  Fevers, chills, fatigue, or  lassitude.  HEENT:   No headaches,  Difficulty swallowing,  Tooth/dental problems, or  Sore throat,                No sneezing, itching, ear ache, nasal congestion, post nasal drip,   CV:  No chest pain,  Orthopnea, PND, swelling in lower extremities, anasarca, dizziness, palpitations, syncope.   GI  No heartburn, indigestion, abdominal pain, nausea, vomiting, diarrhea, change in bowel habits, loss of appetite, bloody stools.   Resp: Occasional shortness of breath with exertion less at rest.  No excess mucus, no productive cough,  No non-productive cough,  No coughing up of blood.  No change in color of mucus.  Occasional wheezing.  No chest wall deformity  Skin: no rash or lesions.  GU: no dysuria, change in color of urine, no urgency or frequency.  No flank pain, no hematuria   MS:  No joint pain or swelling.  No decreased range of motion.  No back pain.  Psych:  No change in mood or affect. No depression or anxiety.  No memory loss.   Vital Signs BP 124/62 (BP Location: Left Arm, Cuff Size: Normal)   Pulse 68   Ht '5\' 2"'  (1.575 m)   Wt 207 lb 6.4 oz (94.1 kg)   LMP  (LMP Unknown)   SpO2 100%   BMI 37.93 kg/m    Physical Exam:  General- No distress,  A&Ox3, pleasant ENT: No sinus  tenderness, TM clear, pale nasal mucosa, no oral exudate,no post nasal drip, no LAN Cardiac: S1, S2, regular rate and rhythm, no murmur Chest: No wheeze/ rales/ dullness; no accessory muscle use, no nasal flaring, no sternal retractions, breath sounds diminished per bases bilaterally Abd.: Soft Non-tender, nondistended, bowel sounds positive, obese Ext: No clubbing cyanosis, edema Neuro:  normal strength Skin: No rashes, warm and dry, no lesions, fingernails that extend approximately 6 inches Psych: normal mood and behavior   Assessment/Plan  Severe persistent asthma Resolution of mild asthma exacerbation Completed Z-Pak and prednisone taper Clinically nontoxic appearing Plan Continue your Symbicort and Spiriva  as you have been doing. Continue your Allegra  daily. Continue your Prilosec daily as you have been doing. Continue your nebulizer treatments twice daily or as needed up to 4 times daily for shortness of breath or wheezing. Follow up with Dr. Ashok Cordia in 3 months with PFT's prior. Please contact office for sooner follow up if symptoms do not improve or worsen or seek emergency care     GERD Continue daily medication regimen    Magdalen Spatz, NP 09/22/2016  9:02 PM

## 2016-09-23 NOTE — Progress Notes (Signed)
Note reviewed.  Sonia Baller Ashok Cordia, M.D. [facility] Pager:  [phone] After 3pm or if no response, call [phone] 7:19 AM 09/23/16

## 2016-09-27 NOTE — Progress Notes (Deleted)
Patient ID: [name]                 DOB: 04-25-1940                    MRN: [id number]     HPI: [name] is a 76 y.o. female patient referred to lipid clinic by ***. PMH is significant for   Current Medications:  Intolerances:  Risk Factors:  LDL goal:   Diet:   Exercise:   Family History:   Social History:   Labs:  Past Medical History:  Diagnosis Date  . Acute myocardial infarction, unspecified site, episode of care unspecified   . Adenomatous colon polyp   . Anxiety   . Chest pain   . Coronary artery disease   . Diabetes mellitus   . GERD (gastroesophageal reflux disease)   . Hiatal hernia   . Hyperlipidemia   . Hypertension   . PONV (postoperative nausea and vomiting)   . Schatzki's ring   . Shoulder injury    resolved after shoulder surgery  . Unspecified asthma(493.90)   . Upper respiratory symptom    02-13-14 "runny nose, mild cough,sniffles" -instructed to inform MD if symptoms worsens    Current Outpatient Prescriptions on File Prior to Visit  Medication Sig Dispense Refill  . albuterol (PROVENTIL HFA;VENTOLIN HFA) 108 (90 BASE) MCG/ACT inhaler Inhale 2 puffs into the lungs every 6 (six) hours as needed. For shortness of breath. 1 Inhaler 0  . albuterol (PROVENTIL) (2.5 MG/3ML) 0.083% nebulizer solution Take 3 mLs (2.5 mg total) by nebulization every 6 (six) hours as needed for wheezing or shortness of breath. 360 mL 3  . ALPRAZolam (XANAX) 0.5 MG tablet Take 0.5 mg by mouth 2 (two) times daily as needed for anxiety.     . baclofen (LIORESAL) 20 MG tablet Take 20 mg by mouth daily.     . benzonatate (TESSALON) 100 MG capsule Take 100 mg by mouth 3 (three) times daily as needed for cough.    . Blood Glucose Monitoring Suppl (ONE TOUCH ULTRA MINI) w/Device KIT 3 (three) times daily. for testing  0  . budesonide-formoterol (SYMBICORT) 160-4.5 MCG/ACT inhaler Inhale 2 puffs into the lungs 2 (two) times daily. 2 Inhaler 0  . Calcium & Magnesium  Carbonates (MYLANTA PO) Take 1 tablet by mouth daily as needed (for indigestion).     . Cholecalciferol (VITAMIN D) 2000 UNITS tablet Take 2,000 Units by mouth daily.     . Cyanocobalamin (VITAMIN B-12 IJ) Inject as directed every 30 (thirty) days.    . cyclobenzaprine (FLEXERIL) 5 MG tablet TAKE 1 TABLET BY MOUTH AT BEDTIME 30 tablet 5  . diclofenac sodium (VOLTAREN) 1 % GEL Apply 2 g topically every 4 (four) hours.    Marland Kitchen diltiazem (CARDIZEM CD) 240 MG 24 hr capsule Take 1 capsule (240 mg total) by mouth daily. 30 capsule 10  . fexofenadine (ALLEGRA) 180 MG tablet Take 1 tablet (180 mg total) by mouth daily. 30 tablet 6  . furosemide (LASIX) 40 MG tablet Take 80 mg by mouth every other day.     . insulin lispro protamine-insulin lispro (HUMALOG 75/25) (75-25) 100 UNIT/ML SUSP Inject 22-27 Units into the skin 2 (two) times daily with a meal. Takes 22 units in the morning and 27 units with supper    . isosorbide mononitrate (IMDUR) 60 MG 24 hr tablet Take 1 tablet (60 mg total) by mouth daily. 30 tablet 11  .  loperamide (IMODIUM) 2 MG capsule Take 2 mg by mouth as needed for diarrhea or loose stools.     . meclizine (ANTIVERT) 25 MG tablet Take 25 mg by mouth 2 (two) times daily as needed for dizziness.     . methocarbamol (ROBAXIN) 500 MG tablet Take 500 mg by mouth every 8 (eight) hours as needed for muscle spasms.  1  . metoprolol succinate (TOPROL-XL) 50 MG 24 hr tablet Take 1 tablet (50 mg total) by mouth daily. 30 tablet 10  . montelukast (SINGULAIR) 5 MG chewable tablet Chew 1 tablet (5 mg total) by mouth at bedtime. 30 tablet 3  . Multiple Vitamin (MULTIVITAMIN WITH MINERALS) TABS Take 1 tablet by mouth daily.    . Nebulizers (COMPRESSOR/NEBULIZER) MISC Use with albuterol 1 each 0  . nitroGLYCERIN (NITROSTAT) 0.4 MG SL tablet Place 1 tablet (0.4 mg total) under the tongue every 5 (five) minutes x 3 doses as needed for chest pain. 25 tablet 6  . omeprazole (PRILOSEC) 40 MG capsule Take 1  capsule (40 mg total) by mouth 2 (two) times daily. 60 capsule 5  . ONE TOUCH ULTRA TEST test strip CHECK BLOOD SUGAR TWICE A DAY DX: E11.9  3  . ONETOUCH DELICA LANCETS 76B MISC 3 (three) times daily. for testing  0  . Pancrelipase, Lip-Prot-Amyl, 24000 units CPEP Take 2 capsules (48,000 Units total) by mouth 3 (three) times daily with meals. Take 1 capsule with a snack 210 capsule 6  . potassium chloride SA (K-DUR,KLOR-CON) 20 MEQ tablet Take 20 mEq by mouth 2 (two) times daily.    Marland Kitchen Spacer/Aero-Holding Chambers (AEROCHAMBER MV) inhaler Use as instructed 1 each 0   Current Facility-Administered Medications on File Prior to Visit  Medication Dose Route Frequency Provider Last Rate Last Dose  . cyanocobalamin ((VITAMIN B-12)) injection 1,000 mcg  1,000 mcg Intramuscular Q30 days Binnie Rail, MD   1,000 mcg at 03/26/16 1658    Allergies  Allergen Reactions  . Amlodipine Other (See Comments)    hallucinations   . Banana Nausea And Vomiting    Stomach pumped  . Co Q10 [Coenzyme Q10]     Body cramps  . Codeine     Hallucinate, loose identity and don't know who I am  . Morphine Other (See Comments)    Can not function, it immobilizes me   . Statins     Muscle cramps  . Sulfonamide Derivatives Swelling    Assessment/Plan:

## 2016-09-28 ENCOUNTER — Ambulatory Visit: Payer: Medicare Other

## 2016-09-28 ENCOUNTER — Telehealth: Payer: Self-pay | Admitting: Acute Care

## 2016-09-28 MED ORDER — DOXYCYCLINE HYCLATE 100 MG PO TABS: 100.0000 mg | ORAL_TABLET | Freq: Two times a day (BID) | ORAL | 0 refills | Status: DC

## 2016-09-28 NOTE — Telephone Encounter (Signed)
Called and spoke with pt. Pt reports of prod cough with green mucus, wheezing &  increased sob x2d Denies fever, chills or sweats.  Pt using ventolin bid and neb solution tid with mild relief. Pt states she completed zpak and prednisone taper on 09/23/16.  JN please advise. Thanks.

## 2016-09-28 NOTE — Telephone Encounter (Signed)
Go ahead and send in a Rx for Doxycycline 100mg  po bid x 7 days. She should continue using her rescue inhaler/nebulizer 3-4 times daily to help with her cough & call if no better. She should avoid excessive sunlight & consuming dairy products at the time that she takes the antibiotic. She should take it with a full glass of water & remain upright for 1 hour after the antibiotic.

## 2016-09-28 NOTE — Telephone Encounter (Signed)
Rx has been sent to preferred pharmacy. Pt is aware of JN's recommendations and voiced her understanding. Nothing further needed.

## 2016-09-28 NOTE — Progress Notes (Deleted)
Patient ID: [name]                 DOB: May 14, 1940                    MRN: [id number]     HPI: [name] is a 76 y.o. female patient referred to lipid clinic by Dr.Nahser. PMH is significant for CAD s/p CABG, hx of MI, type I diabetes, hypertension, hyperlipidemia, chronic diastolic HF, obesity, and COPD. Pt saw Dr. Acie Fredrickson in office on 09/21/16 for preoperative evaluation prior to having surgery on right knee. Lipid panel was done on the day of the visit and indicated that pt's LDL was elevated at 133. She is not currently on any antihyperlipidemic medications. She was referred for lipid management.   *Has she taken any other statins, high or moderate  *Severity of muscle symptoms  Trial of crestor?    Current Medications: N/A  Intolerances: Atorvastatin 40 mg - muscle cramps  Risk Factors: Diabetes, CAD, hx of MI, CHF, obesity   LDL goal: < 70 mg/dL  Diet:   Exercise:   Family History: The patient's family history includes Diabetes in her maternal grandfather and mother; Emphysema in her paternal aunt and paternal uncle; Esophageal cancer (age of onset: 48) in her brother; Heart attack in her father; Heart disease in her maternal grandfather; Heart failure in her maternal grandfather; Rheum arthritis in her sister; Stomach cancer in her father.   Social History: The patient  reports that she quit smoking about 40 years ago. Her smoking use included Cigarettes. She started smoking about 61 years ago. She has a 10.50 pack-year smoking history. She has never used smokeless tobacco. She reports that she does not drink alcohol or use drugs.   Labs: 09/21/16: TC 190, TG 105, HDL 36, VLDL 21, LDL 133  Past Medical History:  Diagnosis Date  . Acute myocardial infarction, unspecified site, episode of care unspecified   . Adenomatous colon polyp   . Anxiety   . Chest pain   . Coronary artery disease   . Diabetes mellitus   . GERD (gastroesophageal reflux disease)   . Hiatal  hernia   . Hyperlipidemia   . Hypertension   . PONV (postoperative nausea and vomiting)   . Schatzki's ring   . Shoulder injury    resolved after shoulder surgery  . Unspecified asthma(493.90)   . Upper respiratory symptom    02-13-14 "runny nose, mild cough,sniffles" -instructed to inform MD if symptoms worsens    Current Outpatient Prescriptions on File Prior to Visit  Medication Sig Dispense Refill  . albuterol (PROVENTIL HFA;VENTOLIN HFA) 108 (90 BASE) MCG/ACT inhaler Inhale 2 puffs into the lungs every 6 (six) hours as needed. For shortness of breath. 1 Inhaler 0  . albuterol (PROVENTIL) (2.5 MG/3ML) 0.083% nebulizer solution Take 3 mLs (2.5 mg total) by nebulization every 6 (six) hours as needed for wheezing or shortness of breath. 360 mL 3  . ALPRAZolam (XANAX) 0.5 MG tablet Take 0.5 mg by mouth 2 (two) times daily as needed for anxiety.     . baclofen (LIORESAL) 20 MG tablet Take 20 mg by mouth daily.     . benzonatate (TESSALON) 100 MG capsule Take 100 mg by mouth 3 (three) times daily as needed for cough.    . Blood Glucose Monitoring Suppl (ONE TOUCH ULTRA MINI) w/Device KIT 3 (three) times daily. for testing  0  . budesonide-formoterol (SYMBICORT) 160-4.5 MCG/ACT inhaler Inhale  2 puffs into the lungs 2 (two) times daily. 2 Inhaler 0  . Calcium & Magnesium Carbonates (MYLANTA PO) Take 1 tablet by mouth daily as needed (for indigestion).     . Cholecalciferol (VITAMIN D) 2000 UNITS tablet Take 2,000 Units by mouth daily.     . Cyanocobalamin (VITAMIN B-12 IJ) Inject as directed every 30 (thirty) days.    . cyclobenzaprine (FLEXERIL) 5 MG tablet TAKE 1 TABLET BY MOUTH AT BEDTIME 30 tablet 5  . diclofenac sodium (VOLTAREN) 1 % GEL Apply 2 g topically every 4 (four) hours.    Marland Kitchen diltiazem (CARDIZEM CD) 240 MG 24 hr capsule Take 1 capsule (240 mg total) by mouth daily. 30 capsule 10  . fexofenadine (ALLEGRA) 180 MG tablet Take 1 tablet (180 mg total) by mouth daily. 30 tablet 6  .  furosemide (LASIX) 40 MG tablet Take 80 mg by mouth every other day.     . insulin lispro protamine-insulin lispro (HUMALOG 75/25) (75-25) 100 UNIT/ML SUSP Inject 22-27 Units into the skin 2 (two) times daily with a meal. Takes 22 units in the morning and 27 units with supper    . isosorbide mononitrate (IMDUR) 60 MG 24 hr tablet Take 1 tablet (60 mg total) by mouth daily. 30 tablet 11  . loperamide (IMODIUM) 2 MG capsule Take 2 mg by mouth as needed for diarrhea or loose stools.     . meclizine (ANTIVERT) 25 MG tablet Take 25 mg by mouth 2 (two) times daily as needed for dizziness.     . methocarbamol (ROBAXIN) 500 MG tablet Take 500 mg by mouth every 8 (eight) hours as needed for muscle spasms.  1  . metoprolol succinate (TOPROL-XL) 50 MG 24 hr tablet Take 1 tablet (50 mg total) by mouth daily. 30 tablet 10  . montelukast (SINGULAIR) 5 MG chewable tablet Chew 1 tablet (5 mg total) by mouth at bedtime. 30 tablet 3  . Multiple Vitamin (MULTIVITAMIN WITH MINERALS) TABS Take 1 tablet by mouth daily.    . Nebulizers (COMPRESSOR/NEBULIZER) MISC Use with albuterol 1 each 0  . nitroGLYCERIN (NITROSTAT) 0.4 MG SL tablet Place 1 tablet (0.4 mg total) under the tongue every 5 (five) minutes x 3 doses as needed for chest pain. 25 tablet 6  . omeprazole (PRILOSEC) 40 MG capsule Take 1 capsule (40 mg total) by mouth 2 (two) times daily. 60 capsule 5  . ONE TOUCH ULTRA TEST test strip CHECK BLOOD SUGAR TWICE A DAY DX: E11.9  3  . ONETOUCH DELICA LANCETS 69D MISC 3 (three) times daily. for testing  0  . Pancrelipase, Lip-Prot-Amyl, 24000 units CPEP Take 2 capsules (48,000 Units total) by mouth 3 (three) times daily with meals. Take 1 capsule with a snack 210 capsule 6  . potassium chloride SA (K-DUR,KLOR-CON) 20 MEQ tablet Take 20 mEq by mouth 2 (two) times daily.    Marland Kitchen Spacer/Aero-Holding Chambers (AEROCHAMBER MV) inhaler Use as instructed 1 each 0   Current Facility-Administered Medications on File Prior to  Visit  Medication Dose Route Frequency Provider Last Rate Last Dose  . cyanocobalamin ((VITAMIN B-12)) injection 1,000 mcg  1,000 mcg Intramuscular Q30 days Binnie Rail, MD   1,000 mcg at 03/26/16 1658    Allergies  Allergen Reactions  . Amlodipine Other (See Comments)    hallucinations   . Banana Nausea And Vomiting    Stomach pumped  . Co Q10 [Coenzyme Q10]     Body cramps  . Codeine  Hallucinate, loose identity and don't know who I am  . Morphine Other (See Comments)    Can not function, it immobilizes me   . Statins     Muscle cramps  . Sulfonamide Derivatives Swelling    Assessment/Plan: Hyperlipidemia:

## 2016-10-07 ENCOUNTER — Ambulatory Visit (INDEPENDENT_AMBULATORY_CARE_PROVIDER_SITE_OTHER): Payer: Medicare Other | Admitting: Neurology

## 2016-10-07 ENCOUNTER — Ambulatory Visit (INDEPENDENT_AMBULATORY_CARE_PROVIDER_SITE_OTHER): Payer: Medicare Other | Admitting: Pharmacist

## 2016-10-07 ENCOUNTER — Ambulatory Visit: Payer: Medicare Other

## 2016-10-07 DIAGNOSIS — G4733 Obstructive sleep apnea (adult) (pediatric): Secondary | ICD-10-CM | POA: Diagnosis not present

## 2016-10-07 DIAGNOSIS — E785 Hyperlipidemia, unspecified: Secondary | ICD-10-CM

## 2016-10-07 NOTE — Patient Instructions (Addendum)
It was good seeing you today.  Due to your cholesterol level being elevated and your intolerance to statins, we will plan to start Repatha 140 mg/mL 1 injection every 14 days.  We will submit an application for patient assistance to your insurance company.   If they contact you regarding the approval of the application, please call our clinic at [phone] to schedule follow up visits and lab work.   Cholesterol Cholesterol is a fat. Your body needs a small amount of cholesterol. Cholesterol (plaque) may build up in your blood vessels (arteries). That makes you more likely to have a heart attack or stroke. You cannot feel your cholesterol level. Having a blood test is the only way to find out if your level is high. Keep your test results. Work with your doctor to keep your cholesterol at a good level. What do the results mean?  Total cholesterol is how much cholesterol is in your blood.  LDL is bad cholesterol. This is the type that can build up. Try to have low LDL.  HDL is good cholesterol. It cleans your blood vessels and carries LDL away. Try to have high HDL.  Triglycerides are fat that the body can store or burn for energy. What are good levels of cholesterol?  Total cholesterol below 200.  LDL below 100 is good for people who have health risks. LDL below 70 is good for people who have very high risks.  HDL above 40 is good. It is best to have HDL of 60 or higher.  Triglycerides below 150. How can I lower my cholesterol? Diet Follow your diet program as told by your doctor.  Choose fish, white meat chicken, or [place] that is roasted or baked. Try not to eat red meat, fried foods, sausage, or lunch meats.  Eat lots of fresh fruits and vegetables.  Choose whole grains, beans, pasta, potatoes, and cereals.  Choose olive oil, corn oil, or canola oil. Only use small amounts.  Try not to eat butter, mayonnaise, shortening, or palm kernel oils.  Try not to eat foods with  trans fats.  Choose low-fat or nonfat dairy foods. ? Drink skim or nonfat milk. ? Eat low-fat or nonfat yogurt and cheeses. ? Try not to drink whole milk or cream. ? Try not to eat ice cream, egg yolks, or full-fat cheeses.  Healthy desserts include angel food cake, ginger snaps, animal crackers, hard candy, popsicles, and low-fat or nonfat frozen yogurt. Try not to eat pastries, cakes, pies, and cookies.  Exercise Follow your exercise program as told by your doctor.  Be more active. Try gardening, walking, and taking the stairs.  Ask your doctor about ways that you can be more active.  Medicine  Take over-the-counter and prescription medicines only as told by your doctor.  This information is not intended to replace advice given to you by your health care provider. Make sure you discuss any questions you have with your health care provider. Document Released: 04/23/2008 Document Revised: 08/27/2015 Document Reviewed: 08/07/2015 Elsevier Interactive Patient Education  2017 [employer].

## 2016-10-07 NOTE — Progress Notes (Deleted)
Patient ID: [name]                 DOB: [birth year]                    MRN: [id number]     HPI: [name] is a 76 y.o. female patient referred to lipid clinic by Dr. Acie Fredrickson. PMH is significant for chronic diastolic HF, CAD/MI s/p CABG, diabetes, HTN, HLD, and COPD. Pt was seen by Dr. Acie Fredrickson on 09/21/16 for preoperative evaluation prior to having right knee surgery. Lipid panel from the same day showed a LDL of 133, and pt was not on any antihyperlipidemics. She reported having muscle cramps with statins. Pt was referred to clinic to discuss potential PCSK9 therapy.   Current Medications: None  Intolerances: atorvastatin 40 mg - muscle cramps   Risk Factors: diabetes, hx of MI  LDL goal: < 70   Diet:   Exercise:   Family History: The patient's family history includes Diabetes in her maternal grandfather and mother; Emphysema in her paternal aunt and paternal uncle; Esophageal cancer (age of onset: 66) in her brother; Heart attack in her father; Heart disease in her maternal grandfather; Heart failure in her maternal grandfather; Rheum arthritis in her sister; Stomach cancer in her father.   Social History: The patient  reports that she quit smoking about 40 years ago. Her smoking use included Cigarettes. She started smoking about 61 years ago. She has a 10.50 pack-year smoking history. She has never used smokeless tobacco. She reports that she does not drink alcohol or use drugs.   Labs: 09/21/16: TC 190, TG 105, HDL 36, LDL 133  (no cholesterol meds)  Past Medical History:  Diagnosis Date  . Acute myocardial infarction, unspecified site, episode of care unspecified   . Adenomatous colon polyp   . Anxiety   . Chest pain   . Coronary artery disease   . Diabetes mellitus   . GERD (gastroesophageal reflux disease)   . Hiatal hernia   . Hyperlipidemia   . Hypertension   . PONV (postoperative nausea and vomiting)   . Schatzki's ring   . Shoulder injury    resolved after  shoulder surgery  . Unspecified asthma(493.90)   . Upper respiratory symptom    02-13-14 "runny nose, mild cough,sniffles" -instructed to inform MD if symptoms worsens    Current Outpatient Prescriptions on File Prior to Visit  Medication Sig Dispense Refill  . albuterol (PROVENTIL HFA;VENTOLIN HFA) 108 (90 BASE) MCG/ACT inhaler Inhale 2 puffs into the lungs every 6 (six) hours as needed. For shortness of breath. 1 Inhaler 0  . albuterol (PROVENTIL) (2.5 MG/3ML) 0.083% nebulizer solution Take 3 mLs (2.5 mg total) by nebulization every 6 (six) hours as needed for wheezing or shortness of breath. 360 mL 3  . ALPRAZolam (XANAX) 0.5 MG tablet Take 0.5 mg by mouth 2 (two) times daily as needed for anxiety.     . baclofen (LIORESAL) 20 MG tablet Take 20 mg by mouth daily.     . benzonatate (TESSALON) 100 MG capsule Take 100 mg by mouth 3 (three) times daily as needed for cough.    . Blood Glucose Monitoring Suppl (ONE TOUCH ULTRA MINI) w/Device KIT 3 (three) times daily. for testing  0  . budesonide-formoterol (SYMBICORT) 160-4.5 MCG/ACT inhaler Inhale 2 puffs into the lungs 2 (two) times daily. 2 Inhaler 0  . Calcium & Magnesium Carbonates (MYLANTA PO) Take 1 tablet by mouth daily  as needed (for indigestion).     . Cholecalciferol (VITAMIN D) 2000 UNITS tablet Take 2,000 Units by mouth daily.     . Cyanocobalamin (VITAMIN B-12 IJ) Inject as directed every 30 (thirty) days.    . cyclobenzaprine (FLEXERIL) 5 MG tablet TAKE 1 TABLET BY MOUTH AT BEDTIME 30 tablet 5  . diclofenac sodium (VOLTAREN) 1 % GEL Apply 2 g topically every 4 (four) hours.    Marland Kitchen diltiazem (CARDIZEM CD) 240 MG 24 hr capsule Take 1 capsule (240 mg total) by mouth daily. 30 capsule 10  . doxycycline (VIBRA-TABS) 100 MG tablet Take 1 tablet (100 mg total) by mouth 2 (two) times daily. 14 tablet 0  . fexofenadine (ALLEGRA) 180 MG tablet Take 1 tablet (180 mg total) by mouth daily. 30 tablet 6  . furosemide (LASIX) 40 MG tablet Take 80 mg  by mouth every other day.     . insulin lispro protamine-insulin lispro (HUMALOG 75/25) (75-25) 100 UNIT/ML SUSP Inject 22-27 Units into the skin 2 (two) times daily with a meal. Takes 22 units in the morning and 27 units with supper    . isosorbide mononitrate (IMDUR) 60 MG 24 hr tablet Take 1 tablet (60 mg total) by mouth daily. 30 tablet 11  . loperamide (IMODIUM) 2 MG capsule Take 2 mg by mouth as needed for diarrhea or loose stools.     . meclizine (ANTIVERT) 25 MG tablet Take 25 mg by mouth 2 (two) times daily as needed for dizziness.     . methocarbamol (ROBAXIN) 500 MG tablet Take 500 mg by mouth every 8 (eight) hours as needed for muscle spasms.  1  . metoprolol succinate (TOPROL-XL) 50 MG 24 hr tablet Take 1 tablet (50 mg total) by mouth daily. 30 tablet 10  . montelukast (SINGULAIR) 5 MG chewable tablet Chew 1 tablet (5 mg total) by mouth at bedtime. 30 tablet 3  . Multiple Vitamin (MULTIVITAMIN WITH MINERALS) TABS Take 1 tablet by mouth daily.    . Nebulizers (COMPRESSOR/NEBULIZER) MISC Use with albuterol 1 each 0  . nitroGLYCERIN (NITROSTAT) 0.4 MG SL tablet Place 1 tablet (0.4 mg total) under the tongue every 5 (five) minutes x 3 doses as needed for chest pain. 25 tablet 6  . omeprazole (PRILOSEC) 40 MG capsule Take 1 capsule (40 mg total) by mouth 2 (two) times daily. 60 capsule 5  . ONE TOUCH ULTRA TEST test strip CHECK BLOOD SUGAR TWICE A DAY DX: E11.9  3  . ONETOUCH DELICA LANCETS 86L MISC 3 (three) times daily. for testing  0  . Pancrelipase, Lip-Prot-Amyl, 24000 units CPEP Take 2 capsules (48,000 Units total) by mouth 3 (three) times daily with meals. Take 1 capsule with a snack 210 capsule 6  . potassium chloride SA (K-DUR,KLOR-CON) 20 MEQ tablet Take 20 mEq by mouth 2 (two) times daily.    Marland Kitchen Spacer/Aero-Holding Chambers (AEROCHAMBER MV) inhaler Use as instructed 1 each 0   Current Facility-Administered Medications on File Prior to Visit  Medication Dose Route Frequency  Provider Last Rate Last Dose  . cyanocobalamin ((VITAMIN B-12)) injection 1,000 mcg  1,000 mcg Intramuscular Q30 days Binnie Rail, MD   1,000 mcg at 03/26/16 1658    Allergies  Allergen Reactions  . Amlodipine Other (See Comments)    hallucinations   . Banana Nausea And Vomiting    Stomach pumped  . Co Q10 [Coenzyme Q10]     Body cramps  . Codeine     Hallucinate, loose  identity and don't know who I am  . Morphine Other (See Comments)    Can not function, it immobilizes me   . Statins     Muscle cramps  . Sulfonamide Derivatives Swelling    Assessment/Plan:

## 2016-10-07 NOTE — Progress Notes (Signed)
Patient ID: [name]                 DOB: 1940-02-09                    MRN: [id number]     HPI: [name] is a 76 y.o. female patient referred to lipid clinic by Dr. Acie Fredrickson. PMH is significant for chronic diastolic HF, CAD/MI s/p CABG, diabetes, HTN, HLD, and COPD. Pt was seen by Dr. Acie Fredrickson on 09/21/16 for preoperative evaluation prior to having right knee surgery. Lipid panel from the same day showed a LDL of 133, and pt was not on any antihyperlipidemics. She reported having muscle cramps with statins. Pt was referred to clinic to discuss potential PCSK9 therapy.   Pt presents to clinic in good spirits. Pt is currently not on any antihyperlipidemics. She reports that atorvastatin 40 mg gave her full body cramps. She also tried Crestor prior to starting the atorvastatin, but it also caused cramps, and she is unsure of the Crestor dose. States that the cramps went away after stopping the medications. Pt does not want to try any other statins. Pt expresses interest in PCSK9i therapy but is concerned about the cost.   Current Medications: None  Intolerances: atorvastatin 40 mg daily - full body muscle cramps, crestor, welchol - full body muscle cramps  Risk Factors: diabetes, hx of MI  LDL goal: < 70   Diet: She tries to eat healthy as much as possible. Eats lean proteins, avoids red meats. Eats lots of greens. Very cautious about carbohydrates because she is a diabetic. Doesn't drink sodas. Drinks juice occasionally.   Exercise: Will be starting to walk 3x a week.   Family History: The patient's family history includes Diabetes in her maternal grandfather and mother; Emphysema in her paternal aunt and paternal uncle; Esophageal cancer (age of onset: 27) in her brother; Heart attack in her father; Heart disease in her maternal grandfather; Heart failure in her maternal grandfather; Rheum arthritis in her sister; Stomach cancer in her father.   Social History: The patient  reports that  she quit smoking about 40 years ago. Her smoking use included Cigarettes. She started smoking about 61 years ago. She has a 10.50 pack-year smoking history. She has never used smokeless tobacco. She reports that she does not drink alcohol or use drugs.   Labs: 09/21/16: TC 190, TG 105, HDL 36, LDL 133  (no cholesterol meds)  Past Medical History:  Diagnosis Date  . Acute myocardial infarction, unspecified site, episode of care unspecified   . Adenomatous colon polyp   . Anxiety   . Chest pain   . Coronary artery disease   . Diabetes mellitus   . GERD (gastroesophageal reflux disease)   . Hiatal hernia   . Hyperlipidemia   . Hypertension   . PONV (postoperative nausea and vomiting)   . Schatzki's ring   . Shoulder injury    resolved after shoulder surgery  . Unspecified asthma(493.90)   . Upper respiratory symptom    02-13-14 "runny nose, mild cough,sniffles" -instructed to inform MD if symptoms worsens    Current Outpatient Prescriptions on File Prior to Visit  Medication Sig Dispense Refill  . albuterol (PROVENTIL HFA;VENTOLIN HFA) 108 (90 BASE) MCG/ACT inhaler Inhale 2 puffs into the lungs every 6 (six) hours as needed. For shortness of breath. 1 Inhaler 0  . albuterol (PROVENTIL) (2.5 MG/3ML) 0.083% nebulizer solution Take 3 mLs (2.5 mg total) by  nebulization every 6 (six) hours as needed for wheezing or shortness of breath. 360 mL 3  . ALPRAZolam (XANAX) 0.5 MG tablet Take 0.5 mg by mouth 2 (two) times daily as needed for anxiety.     . baclofen (LIORESAL) 20 MG tablet Take 20 mg by mouth daily.     . benzonatate (TESSALON) 100 MG capsule Take 100 mg by mouth 3 (three) times daily as needed for cough.    . Blood Glucose Monitoring Suppl (ONE TOUCH ULTRA MINI) w/Device KIT 3 (three) times daily. for testing  0  . budesonide-formoterol (SYMBICORT) 160-4.5 MCG/ACT inhaler Inhale 2 puffs into the lungs 2 (two) times daily. 2 Inhaler 0  . Calcium & Magnesium Carbonates (MYLANTA PO) Take  1 tablet by mouth daily as needed (for indigestion).     . Cholecalciferol (VITAMIN D) 2000 UNITS tablet Take 2,000 Units by mouth daily.     . Cyanocobalamin (VITAMIN B-12 IJ) Inject as directed every 30 (thirty) days.    . cyclobenzaprine (FLEXERIL) 5 MG tablet TAKE 1 TABLET BY MOUTH AT BEDTIME 30 tablet 5  . diclofenac sodium (VOLTAREN) 1 % GEL Apply 2 g topically every 4 (four) hours.    Marland Kitchen diltiazem (CARDIZEM CD) 240 MG 24 hr capsule Take 1 capsule (240 mg total) by mouth daily. 30 capsule 10  . doxycycline (VIBRA-TABS) 100 MG tablet Take 1 tablet (100 mg total) by mouth 2 (two) times daily. 14 tablet 0  . fexofenadine (ALLEGRA) 180 MG tablet Take 1 tablet (180 mg total) by mouth daily. 30 tablet 6  . furosemide (LASIX) 40 MG tablet Take 80 mg by mouth every other day.     . insulin lispro protamine-insulin lispro (HUMALOG 75/25) (75-25) 100 UNIT/ML SUSP Inject 22-27 Units into the skin 2 (two) times daily with a meal. Takes 22 units in the morning and 27 units with supper    . isosorbide mononitrate (IMDUR) 60 MG 24 hr tablet Take 1 tablet (60 mg total) by mouth daily. 30 tablet 11  . loperamide (IMODIUM) 2 MG capsule Take 2 mg by mouth as needed for diarrhea or loose stools.     . meclizine (ANTIVERT) 25 MG tablet Take 25 mg by mouth 2 (two) times daily as needed for dizziness.     . methocarbamol (ROBAXIN) 500 MG tablet Take 500 mg by mouth every 8 (eight) hours as needed for muscle spasms.  1  . metoprolol succinate (TOPROL-XL) 50 MG 24 hr tablet Take 1 tablet (50 mg total) by mouth daily. 30 tablet 10  . montelukast (SINGULAIR) 5 MG chewable tablet Chew 1 tablet (5 mg total) by mouth at bedtime. 30 tablet 3  . Multiple Vitamin (MULTIVITAMIN WITH MINERALS) TABS Take 1 tablet by mouth daily.    . Nebulizers (COMPRESSOR/NEBULIZER) MISC Use with albuterol 1 each 0  . nitroGLYCERIN (NITROSTAT) 0.4 MG SL tablet Place 1 tablet (0.4 mg total) under the tongue every 5 (five) minutes x 3 doses as  needed for chest pain. 25 tablet 6  . omeprazole (PRILOSEC) 40 MG capsule Take 1 capsule (40 mg total) by mouth 2 (two) times daily. 60 capsule 5  . ONE TOUCH ULTRA TEST test strip CHECK BLOOD SUGAR TWICE A DAY DX: E11.9  3  . ONETOUCH DELICA LANCETS 40J MISC 3 (three) times daily. for testing  0  . Pancrelipase, Lip-Prot-Amyl, 24000 units CPEP Take 2 capsules (48,000 Units total) by mouth 3 (three) times daily with meals. Take 1 capsule with a snack  210 capsule 6  . potassium chloride SA (K-DUR,KLOR-CON) 20 MEQ tablet Take 20 mEq by mouth 2 (two) times daily.    Marland Kitchen Spacer/Aero-Holding Chambers (AEROCHAMBER MV) inhaler Use as instructed 1 each 0   Current Facility-Administered Medications on File Prior to Visit  Medication Dose Route Frequency Provider Last Rate Last Dose  . cyanocobalamin ((VITAMIN B-12)) injection 1,000 mcg  1,000 mcg Intramuscular Q30 days Binnie Rail, MD   1,000 mcg at 03/26/16 1658    Allergies  Allergen Reactions  . Amlodipine Other (See Comments)    hallucinations   . Banana Nausea And Vomiting    Stomach pumped  . Co Q10 [Coenzyme Q10]     Body cramps  . Codeine     Hallucinate, loose identity and don't know who I am  . Morphine Other (See Comments)    Can not function, it immobilizes me   . Statins     Muscle cramps  . Sulfonamide Derivatives Swelling    Assessment/Plan:  1. Hyperlipidemia: Pt's most recent LDL is elevated. Given her severe intolerance to both atorvastatin and rosuvastatin, will plan to start Repatha 140 mg/mL. PCSK9i therapy is best option for patient as there is no evidence for Zetia monotherapy and Zetia alone likely would not get LDL to goal. Discussed the safety, efficacy, and injection technique regarding PCSK9 inhibitors. Pt is agreeable. Will submit [employer] application for Repatha patient assistance to help with cost. Plan to notify pt regarding the status of the application and will schedule f/u visit and lab work  accordingly.  Barkley Boards, PharmD Student.   Thank you, Lelan Pons. [facility], [place]  [address], [place], [place] [address]  Phone: [phone]; Fax: [phone] 10/08/2016 7:12 AM

## 2016-10-08 ENCOUNTER — Encounter: Payer: Self-pay | Admitting: Pharmacist

## 2016-10-12 NOTE — Procedures (Signed)
PATIENT'S NAME:  [name], [name] DOB:      1940-12-19      MR#:    [id number]     DATE OF RECORDING: 10/07/2016 REFERRING M.D.:  Billey Gosling MD Study Performed:   Baseline Polysomnogram HISTORY: 76 year-old right-handed woman with an underlying medical history of hyperlipidemia, CAD, s/p 3 vessel CABG, B12 deficiency, hypertension, type 2 diabetes, neck pain, NASH and obesity, who presents for reevaluation of her obstructive sleep apnea. The patient's weight 204 pounds with a height of 62 (inches), resulting in a BMI of 37.7 kg/m2. The patient's neck circumference measured 16 inches.  CURRENT MEDICATIONS: Proventil, Xanax, Zithromax, Lioresal, Tessalon, One Touch, Symbicort, Calcium & Magnesium, Mylanta, Vitamin D, Vitamin B, Flexeril, Voltaren, Cardizem, Allegra, Lasix, Humalog, Imodium, Antivert, Robaxin, Toprol, Singulair, Multivitamin, etc.   PROCEDURE:  This is a multichannel digital polysomnogram utilizing the Somnostar 11.2 system.  Electrodes and sensors were applied and monitored per AASM Specifications.   EEG, EOG, Chin and Limb EMG, were sampled at 200 Hz.  ECG, Snore and Nasal Pressure, Thermal Airflow, Respiratory Effort, CPAP Flow and Pressure, Oximetry was sampled at 50 Hz. Digital video and audio were recorded.      BASELINE STUDY  Lights Out was at 22:58 and Lights On at 05:02.  Total recording time (TRT) was 364.5 minutes, with a total sleep time (TST) of  40.5 minutes.   The patient's sleep latency was 202.5 minutes, which is markedly delayed. REM sleep was absent. The sleep efficiency was 11.1 %.     SLEEP ARCHITECTURE: WASO (Wake after sleep onset) was 33 minutes with moderate sleep fragmentation noted and inability to achieve sleep after 03:06 AM. There were 13 minutes in Stage N1, 27.5 minutes Stage N2, 0 minutes Stage N3 and 0 minutes in Stage REM.  The percentage of Stage N1 was 32.1%, Stage N2 was 67.9%, both increased, N3 and stage R (REM sleep) were absent. The arousals were  noted as: 4 were spontaneous, 0 were associated with PLMs, 0 were associated with respiratory events.    Audio and video analysis did not show any abnormal or unusual movements, behaviors, phonations or vocalizations. The pulse oximeter probe had to be placed on a toe as she had very long fingernails. The patient took no bathroom breaks. No significant snoring was noted. The EKG was in keeping with normal sinus rhythm (NSR).  RESPIRATORY ANALYSIS:  There were a total of 0 respiratory events:  0 obstructive apneas, 0 central apneas and 0 mixed apneas with a total of 0 apneas and an apnea index (AI) of 0 /hour. There were 0 hypopneas with a hypopnea index of 0 /hour. The patient also had 0 respiratory event related arousals (RERAs).      The total APNEA/HYPOPNEA INDEX (AHI) was 0/hour and the total RESPIRATORY DISTURBANCE INDEX was 0 /hour.  0 events occurred in REM sleep and 0 events in NREM. The REM AHI was 0 /hour, versus a non-REM AHI of 0. The patient spent 0 minutes of total sleep time in the supine position and 41 minutes in non-supine.. The supine AHI was 0.0 versus a non-supine AHI of 0.0.  OXYGEN SATURATION & C02:  The Wake baseline 02 saturation was 92%, with the lowest being 80%. Time spent below 89% saturation equaled 2 minutes.   PERIODIC LIMB MOVEMENTS: The patient had a total of 0 Periodic Limb Movements.  The Periodic Limb Movement (PLM) index was 0 and the PLM Arousal index was 0/hour.  Post-study, the  patient indicated that sleep was the same as usual.   IMPRESSION:  1. Inadequate study, prior diagnosis of mild Obstructive Sleep Apnea (OSA)  RECOMMENDATIONS:  1. This sleep study was inadequate due to very little sleep achieved and absence of REM sleep. Total sleep time was only 40.5 minutes with a total recording time of over 6 hours. A reliable diagnosis and resultant recommendations cannot be based on this study.  2. Given her prior diagnosis of rather mild OSA with a total  AHI of 6.8/hour (baseline sleep study from 10/31/14), the patient will be advised to avoid sleeping on her back and pursue weight loss.  3. The patient should be cautioned not to drive, work at heights, or operate dangerous or heavy equipment when tired or sleepy. Review and reiteration of good sleep hygiene measures should be pursued with any patient. 4. This study shows sleep fragmentation and abnormal sleep stage percentages; these are nonspecific findings and per se do not signify an intrinsic sleep disorder or a cause for the patient's sleep-related symptoms. Causes include (but are not limited to) the first night effect of the sleep study, circadian rhythm disturbances, medication effect or an underlying mood disorder or medical problem.  5. She can follow up in sleep clinic on an as needed basis.   I certify that I have reviewed the entire raw data recording prior to the issuance of this report in accordance with the Standards of Accreditation of the American Academy of Sleep Medicine (AASM)   Star Age, MD, PhD Diplomat, American Board of Psychiatry and Neurology (Neurology and Sleep Medicine)

## 2016-10-12 NOTE — Progress Notes (Signed)
Patient referred by Dr. Jaynee Eagles and last seen on 09/09/16 for re-qualification of CPAP therapy (prior non-compliance), diagnostic PSG on 10/07/16.   Please call and notify the patient that the recent sleep study did not show any significant obstructive sleep apnea, but this is because the study was inadequate due to very little sleep achieved and absence of REM sleep. Total sleep time was only 40.5 minutes with a total recording time of over 6 hours. A reliable diagnosis and resultant recommendations cannot be based on this study. However, given her prior diagnosis of rather mild OSA with a total AHI of 6.8/hour (baseline sleep study from 10/31/14), the patient will be advised to avoid sleeping on her back and pursue weight loss; these are valid means to treat mild/borderline OSA. She can FU with Dr. Jaynee Eagles and PCP as planned/scheduled. Once you have spoken to patient, you can close this encounter.   Thanks,  Star Age, MD, PhD [facility])

## 2016-10-13 ENCOUNTER — Telehealth: Payer: Self-pay

## 2016-10-13 NOTE — Telephone Encounter (Signed)
I called pt to discuss her sleep study. No answer, left a message asking her to call me back. 

## 2016-10-13 NOTE — Telephone Encounter (Signed)
-----   Message from Star Age, MD sent at 10/12/2016  2:18 PM EDT ----- Patient referred by Dr. Jaynee Eagles and last seen on 09/09/16 for re-qualification of CPAP therapy (prior non-compliance), diagnostic PSG on 10/07/16.   Please call and notify the patient that the recent sleep study did not show any significant obstructive sleep apnea, but this is because the study was inadequate due to very little sleep achieved and absence of REM sleep. Total sleep time was only 40.5 minutes with a total recording time of over 6 hours. A reliable diagnosis and resultant recommendations cannot be based on this study. However, given her prior diagnosis of rather mild OSA with a total AHI of 6.8/hour (baseline sleep study from 10/31/14), the patient will be advised to avoid sleeping on her back and pursue weight loss; these are valid means to treat mild/borderline OSA. She can FU with Dr. Jaynee Eagles and PCP as planned/scheduled. Once you have spoken to patient, you can close this encounter.   Thanks,  Star Age, MD, PhD [facility])

## 2016-10-14 NOTE — Telephone Encounter (Signed)
Pt returned my call. I advised her that her sleep study did not show any significant osa but this was because her study was inadequate with only 40.5 mins of sleep and absence of REM sleep. Dr. Rexene Alberts cannot make a reliable diagnosis and resultant recommendations based on this study but given her prior diagnosis of rather mild osa, Dr. Rexene Alberts recommends that pt avoid sleeping on her back and pursue weight loss. Pt may follow up with Dr. Jaynee Eagles and PCP as planned. Pt reports that she cannot sleep on her back due to nightmares and that her headaches are getting worse. An appt was scheduled with Jinny Blossom, NP on 10/18/16 at 1:30pm per pt request to discuss her headaches. Pt verbalized understanding of results and had no further questions or concerns at this time.

## 2016-10-18 ENCOUNTER — Ambulatory Visit: Payer: Self-pay | Admitting: Adult Health

## 2016-10-19 ENCOUNTER — Encounter: Payer: Self-pay | Admitting: Adult Health

## 2016-10-19 ENCOUNTER — Encounter: Payer: Self-pay | Admitting: Physical Therapy

## 2016-10-19 ENCOUNTER — Ambulatory Visit: Payer: Medicare Other | Attending: Neurology | Admitting: Physical Therapy

## 2016-10-19 DIAGNOSIS — M542 Cervicalgia: Secondary | ICD-10-CM

## 2016-10-19 DIAGNOSIS — M6281 Muscle weakness (generalized): Secondary | ICD-10-CM | POA: Insufficient documentation

## 2016-10-19 DIAGNOSIS — R293 Abnormal posture: Secondary | ICD-10-CM

## 2016-10-19 NOTE — Patient Instructions (Signed)
  Trigger Point Dry Needling  . What is Trigger Point Dry Needling (DN)? o DN is a physical therapy technique used to treat muscle pain and dysfunction. Specifically, DN helps deactivate muscle trigger points (muscle knots).  o A thin filiform needle is used to penetrate the skin and stimulate the underlying trigger point. The goal is for a local twitch response (LTR) to occur and for the trigger point to relax. No medication of any kind is injected during the procedure.   . What Does Trigger Point Dry Needling Feel Like?  o The procedure feels different for each individual patient. Some patients report that they do not actually feel the needle enter the skin and overall the process is not painful. Very mild bleeding may occur. However, many patients feel a deep cramping in the muscle in which the needle was inserted. This is the local twitch response.   Marland Kitchen How Will I feel after the treatment? o Soreness is normal, and the onset of soreness may not occur for a few hours. Typically this soreness does not last longer than two days.  o Bruising is uncommon, however; ice can be used to decrease any possible bruising.  o In rare cases feeling tired or nauseous after the treatment is normal. In addition, your symptoms may get worse before they get better, this period will typically not last longer than 24 hours.   . What Can I do After My Treatment? o Increase your hydration by drinking more water for the next 24 hours. o You may place ice or heat on the areas treated that have become sore, however, do not use heat on inflamed or bruised areas. Heat often brings more relief post needling. o You can continue your regular activities, but vigorous activity is not recommended initially after the treatment for 24 hours. o DN is best combined with other physical therapy such as strengthening, stretching, and other therapies.    Voncille Lo, PT Certified Exercise Expert for the Aging Adult  10/19/16  10:56 AM Phone: [phone] Fax: [phone]

## 2016-10-19 NOTE — Therapy (Signed)
[place], [place], [address] Phone: [phone]   Fax:  [phone]  Physical Therapy Evaluation  Patient Details  Name: [name] MRN: [id number] Date of Birth: 04/07/1940 Referring Provider: Sarina Ill  Encounter Date: 10/19/2016      PT End of Session - 10/19/16 1152    Visit Number 1   Number of Visits 12   Date for PT Re-Evaluation 11/30/16   Authorization Type [employer] Medicare   Authorization Time Period 11/30/16   PT Start Time 1020   PT Stop Time 1145   PT Time Calculation (min) 85 min   Activity Tolerance Patient tolerated treatment well   Behavior During Therapy [facility] for tasks assessed/performed      Past Medical History:  Diagnosis Date  . Acute myocardial infarction, unspecified site, episode of care unspecified   . Adenomatous colon polyp   . Anxiety   . Chest pain   . Coronary artery disease   . Diabetes mellitus   . GERD (gastroesophageal reflux disease)   . Hiatal hernia   . Hyperlipidemia   . Hypertension   . PONV (postoperative nausea and vomiting)   . Schatzki's ring   . Shoulder injury    resolved after shoulder surgery  . Unspecified asthma(493.90)   . Upper respiratory symptom    02-13-14 "runny nose, mild cough,sniffles" -instructed to inform MD if symptoms worsens    Past Surgical History:  Procedure Laterality Date  . ABDOMINAL HYSTERECTOMY    . APPENDECTOMY     came out with Hysterectomy  . CARDIAC CATHETERIZATION  07/23/2009   EF 60%  . CARDIAC CATHETERIZATION  10/11/2008  . CARDIAC CATHETERIZATION  03/01/2007   EF 75-80%  . CARDIOVASCULAR STRESS TEST  11/15/2007   EF 60%  . COLONOSCOPY    . CORONARY ARTERY BYPASS GRAFT     SEVERELY DISEASED SAPHENOUS VEIN GRAFT TO THE RIGHT CORONARY ARTERY BUT WITH FAIRLY WELL PRESERVED FLOW TO THE DISTAL RIGHT CORONARY ARTERY FROM THE NATIVE CIRCULATION-RESTART  CATH IN JUNE 2000, REVEALS MILD/MODERATE  CAD WITH GOOD FLOW  DOWN HER LAD  . ESOPHAGEAL DILATION    . ESOPHAGOGASTRODUODENOSCOPY    . EYE SURGERY     bilateral cataract surgery with lens implant  . lense removal Left   . ROTATOR CUFF REPAIR     right and left  . TONSILLECTOMY     age 76  . 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 tumor removed kidney    . US ECHOCARDIOGRAPHY  03/08/2008   EF 55-60%    There were no vitals filed for this visit.       Subjective Assessment - 10/19/16 1027    Subjective My neck pain is so severe it wakes me out of my sleep. Starting at the base of my neck and radiate up to my head and I also have headaches.  They start from the pain in my neck. It throws off my balance and I immediateley sit down   Pertinent History sleep apnea,  neck pain for 2 and 1/2 years, CABG x 3 6 years ago., GERD  see full history, DM, TSR right , ringing in my ears   Limitations --  sleeping   Diagnostic tests none   Patient Stated Goals I want the headaches to go away and my neck pain  to go away   Currently in Pain? Yes   Pain Score 10-Worst pain ever  7/10 after RX   Pain Location Neck  also headache   Pain Orientation Right;Lower;Left  right worse than Left   Pain Descriptors / Indicators Tightness;Sharp   Pain Type Chronic pain   Pain Onset More than a month ago   Pain Frequency Intermittent   Aggravating Factors  stress , household chore, vacuuming, driving   Pain Relieving Factors stop and rest, neck pillow            [facility] PT Assessment - 10/19/16 1043      Assessment   Medical Diagnosis chronic neck pain   Referring Provider Sarina Ill   Onset Date/Surgical Date --  pt reports pain started about 2 years ago   Hand Dominance Right   Next MD Visit about November 17, 2016   Prior Therapy none     Precautions   Precautions None     Restrictions   Weight Bearing  Restrictions No     Balance Screen   Has the patient fallen in the past 6 months Yes   How many times? 1  slipped and tripped over my feet getting the phone   Has the patient had a decrease in activity level because of a fear of falling?  No   Is the patient reluctant to leave their home because of a fear of falling?  No     Home Environment   Living Environment Private residence   [place] to enter;Level entry   [place] One level     Cognition   Overall Cognitive Status Within Functional Limits for tasks assessed     Observation/Other Assessments   Focus on Therapeutic Outcomes (FOTO)  FOTO intake 36%, limitations 64%  predicted 50%     Posture/Postural Control   Posture/Postural Control Postural limitations   Postural Limitations Rounded Shoulders;Forward head     AROM   Overall AROM  Deficits   Right Shoulder Flexion 149 Degrees   Right Shoulder ABduction 146 Degrees   Left Shoulder Flexion 156 Degrees   Left Shoulder ABduction 154 Degrees   Cervical Flexion 40  ERP marked    Cervical Extension 49  ERP marked   Cervical - Right Side Bend 21   Cervical - Left Side Bend 16   Cervical - Right Rotation 43   Cervical - Left Rotation 38  painful ERP     Strength   Overall Strength Deficits   Overall Strength Comments scapular stabilizers 4-/5 bil   Right Shoulder Flexion 4/5   Right Shoulder ABduction 4/5   Left Shoulder Flexion 4+/5   Left Shoulder ABduction 4+/5     Palpation   Palpation comment very tender over bil traps. levator  right > Left            Objective measurements completed on examination: See above findings.          [place] Adult PT Treatment/Exercise - 10/19/16 1043      Self-Care   Self-Care Other Self-Care Comments   Other Self-Care Comments  education on precautians and aftercare of TDN     Modalities   Modalities Moist Heat     Moist Heat Therapy   Number Minutes Moist Heat 15  Minutes   Moist Heat Location Cervical     Manual Therapy   Manual Therapy Soft tissue mobilization   Manual therapy comments very tender trigger  points in Upper trap levator   Soft tissue mobilization bil upper trap and levatore     Neck Exercises: Stretches   Upper Trapezius Stretch 3 reps;30 seconds   Upper Trapezius Stretch Limitations bil   Levator Stretch 3 reps;30 seconds   Levator Stretch Limitations bil   Neck Stretch 5 reps;10 seconds   Neck Stretch Limitations retraction          Trigger Point Dry Needling - 10/19/16 1101    Consent Given? Yes   Education Handout Provided Yes   Muscles Treated Upper Body Upper trapezius;Levator scapulae  bil   Upper Trapezius Response Twitch reponse elicited;Palpable increased muscle length   Levator Scapulae Response Twitch response elicited;Palpable increased muscle length              PT Education - 10/19/16 1112    Education provided Yes   Education Details POC, Explanation of findings.  inital HEP and trigger point dry needling education    Person(s) Educated Patient   Methods Explanation;Demonstration;Tactile cues;Verbal cues;Handout   Comprehension Verbalized understanding;Returned demonstration             PT Long Term Goals - 10/19/16 1115      PT LONG TERM GOAL #1   Title "Demonstrate and verbalize techniques to reduce the risk of re-injury including: lifting, posture, body mechanics.    Time 6   Period Weeks   Status New   Target Date 11/30/16     PT LONG TERM GOAL #2   Title "Pt will be independent with advanced HEP.    Time 6   Period Weeks   Status New   Target Date 11/30/16     PT LONG TERM GOAL #3   Title "Improved cervical rotation to at least 45 degrees bil. 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    Time 6   Period Weeks   Status New   Target Date 11/30/16     PT LONG TERM GOAL #6   Title Pt will be able to complete household chores without exacerbating pain in neck/headache above 2/10 pain level   Time 6   Period Weeks   Status New   Target Date 11/30/16                Plan - 10/19/16 1158    Clinical Impression Statement 76 yo female presents with gaurded neck movement and complaint of 10/10 pain in neck and headache. Pt reports 2 and 1/2 year history of neck pain.  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  she is also unable to vacuum/ do household chores due to neck/headache pain.  she is limtied in her ability to perform duties as a Theme park manager and would like to return to driving, sitting at computer, household  chores without incapactitating 10/10 pain. Pt with limited AROM, strength, postural compensation.  Pt will benefit from skilled PT to address postural, muscle spasms, weakness and pain issues.  Pt consented to TDN and was closely monitored throughout session. Pt decreased from 10/10 to 7/10 ost RX . Will continue to address deficits as outlined in evaluation.   History and Personal Factors relevant to plan of care: See history. neck pain, headachel,  post 6 years CABG, GERD, Right TSR   Clinical Presentation Evolving   Clinical Decision Making Moderate   Rehab Potential Good   PT Frequency 2x / week   PT Duration 6 weeks   PT Treatment/Interventions Taping;Dry needling;Passive range of motion;Manual techniques;Therapeutic activities;Therapeutic exercise;Neuromuscular re-education;Patient/family education;Ultrasound;Moist  Heat;Traction;Iontophoresis 4mg /ml Dexamethasone;Electrical Stimulation;Cryotherapy   PT Next Visit Plan Deep neck flexors.  Assess TDN benefit, scapular stabilizers   PT Home Exercise Plan neck retraction Upper trap and levator stretch.  infor on TDN      Patient will benefit from skilled therapeutic intervention in order to improve the following deficits and impairments:  Pain, Impaired UE functional use, Improper body mechanics, Postural dysfunction, Increased muscle spasms, Decreased strength, Decreased range of motion  Visit Diagnosis: Cervicalgia  Abnormal posture  Muscle weakness (generalized)      G-Codes - Nov 12, 2016 05-31-06    Functional Assessment Tool Used (Outpatient Only) FOTO   Functional Limitation Carrying, moving and handling objects   Carrying, Moving and Handling Objects Current Status (G1829) At least 60 percent but less than 80 percent impaired, limited or restricted  26   Carrying, Moving and Handling Objects Goal Status (H3716) At least 40 percent but less than 60 percent impaired, limited or restricted  50%       Problem List Patient Active Problem List   Diagnosis Date Noted  . Carotid artery disease ([place]) 05/19/2016  . Fatigue 05/19/2016  . Anemia 03/27/2016  . Severe persistent asthma 03/18/2016  . Type 1 diabetes mellitus ([place]) 07/21/2015  . Vitamin B12 deficiency 03/31/2015  . OSA (obstructive sleep apnea) 11/20/2014  . Fecal incontinence 08/28/2014  . 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 Falls 08/28/2014  . Neck pain of over 3 months duration 08/28/2014  . H/O total knee replacement 02/20/2014  . Obesity (BMI 30-39.9) 11/12/2013  . Chronic diastolic CHF (congestive heart failure) ([place]) 11/17/2012  . Meniscus, lateral, anterior horn derangement 11/10/2011  . Medial meniscus, posterior horn derangement 11/10/2011  . Osteoarthritis of right knee 11/10/2011  . Vertigo 10/05/2011  . HTN (hypertension), benign 10/05/2011  . Coronary artery  disease 08/27/2010  . MUSCLE CRAMPS 12/29/2009  . HEART ATTACK 11/19/2009  . Extrinsic asthma 11/19/2009  . GERD 11/19/2009  . Hyperlipidemia 11/18/2009  Voncille Lo, PT Certified Exercise Expert for the Aging Adult  Nov 12, 2016 12:11 PM Phone: [phone] Fax: [facility] [address] [place], [place], [address] Phone: [phone]   Fax:  [phone]  Name: DOLORIS SERVANTES MRN: [id number] Date of Birth: 1940/12/05

## 2016-10-25 ENCOUNTER — Telehealth: Payer: Self-pay | Admitting: Adult Health

## 2016-10-25 NOTE — Telephone Encounter (Signed)
[name], [place]

## 2016-10-25 NOTE — Telephone Encounter (Signed)
Pt called in she has rec'd the no show letter. She said there was a death in her family and there was someone that was to call and c/a the appt. She did not know that it was not done until she rec'd the letter. She does not want to be dismissed from the clinic. I did explain the no show policy, she understood. Pt was very polite and appreciative.  FYI

## 2016-10-26 ENCOUNTER — Telehealth: Payer: Self-pay | Admitting: Pharmacist

## 2016-10-26 NOTE — Telephone Encounter (Signed)
Received approval from [employer] for [employer]. LMOM for pt to return call.

## 2016-10-27 NOTE — Telephone Encounter (Signed)
Pt is aware to Engineer, production for [place] delivery. She will call clinic once she receives her first shipment so that we can schedule lab work.

## 2016-10-28 ENCOUNTER — Ambulatory Visit (INDEPENDENT_AMBULATORY_CARE_PROVIDER_SITE_OTHER): Payer: Medicare Other | Admitting: Gastroenterology

## 2016-10-28 ENCOUNTER — Encounter: Payer: Self-pay | Admitting: Gastroenterology

## 2016-10-28 VITALS — BP 152/82 | HR 76 | Ht 62.0 in | Wt 204.0 lb

## 2016-10-28 DIAGNOSIS — K8689 Other specified diseases of pancreas: Secondary | ICD-10-CM | POA: Diagnosis not present

## 2016-10-28 DIAGNOSIS — R1310 Dysphagia, unspecified: Secondary | ICD-10-CM | POA: Diagnosis not present

## 2016-10-28 DIAGNOSIS — K222 Esophageal obstruction: Secondary | ICD-10-CM

## 2016-10-28 DIAGNOSIS — K219 Gastro-esophageal reflux disease without esophagitis: Secondary | ICD-10-CM | POA: Diagnosis not present

## 2016-10-28 DIAGNOSIS — R1319 Other dysphagia: Secondary | ICD-10-CM

## 2016-10-28 NOTE — Patient Instructions (Signed)
You have been scheduled for an endoscopy. Please follow written instructions given to you at your visit today. If you use inhalers (even only as needed), please bring them with you on the day of your procedure. Your physician has requested that you go to www.startemmi.com and enter the access code given to you at your visit today. This web site gives a general overview about your procedure. However, you should still follow specific instructions given to you by our office regarding your preparation for the procedure.  [name] can take over the counter Tums for your reflux symptoms.   Normal BMI (Body Mass Index- based on height and weight) is between 23 and 30. Your BMI today is Body mass index is 37.31 kg/m. Marland Kitchen Please consider follow up  regarding your BMI with your Primary Care Provider.  Thank you for choosing me and [facility].  Pricilla Riffle. Dagoberto Ligas., MD., [facility] Regal

## 2016-10-28 NOTE — Progress Notes (Signed)
    History of Present Illness: This is a 76 year old female complaining of solid food dysphagia and frequent GERD. She notes certain foods such as raw vegetables and to lead to breakthrough symptoms. She has had several episodes of solid food dysphagia over the past few weeks similar symptoms 2 years ago relieved with dilation of her esophageal stricture.  EGD 12/2014 1. 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 Subepithelial lesion in the prepyloric region of the stomach and gastric antrum 3. Stricture at the gastroesophageal junction; dilated using savary dilators over guidewire 4. Duodenitis in the duodenal bulb  Current Medications, Allergies, Past Medical History, Past Surgical History, Family History and Social History were reviewed in [employer] record.  Physical Exam: General: Well developed, well nourished, no acute distress Head: Normocephalic and atraumatic Eyes:  sclerae anicteric, EOMI Ears: Normal auditory acuity Mouth: No deformity or lesions Lungs: Clear throughout to auscultation Heart: Regular rate and rhythm; no murmurs, rubs or bruits Abdomen: Soft, non tender and non distended. No masses, hepatosplenomegaly or hernias noted. Normal Bowel sounds Musculoskeletal: Symmetrical with no gross deformities  Pulses:  Normal pulses noted Extremities: No clubbing, cyanosis, edema or deformities noted Neurological: Alert oriented x 4, grossly nonfocal Psychological:  Alert and cooperative. Normal mood and affect  Assessment and Recommendations:  1.  Dysphagia, GERD. 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 TUMS as needed for breakthrough symptoms. Continue omeprazole 40 mg by mouth twice a day. Schedule EGD/dilation. The risks (including bleeding, perforation, infection, missed lesions, medication reactions and possible hospitalization or surgery if complications occur), benefits, and alternatives to endoscopy with possible biopsy  and possible dilation were discussed with the patient and they consent to proceed.   2. Pancreatic insufficiency. Continue pancreatic enzymes before meals and snacks as currently prescribed.

## 2016-11-02 ENCOUNTER — Ambulatory Visit: Payer: Medicare Other | Admitting: Physical Therapy

## 2016-11-02 ENCOUNTER — Encounter: Payer: Self-pay | Admitting: Physical Therapy

## 2016-11-02 ENCOUNTER — Telehealth: Payer: Self-pay | Admitting: Pharmacist

## 2016-11-02 DIAGNOSIS — M542 Cervicalgia: Secondary | ICD-10-CM | POA: Diagnosis not present

## 2016-11-02 DIAGNOSIS — M6281 Muscle weakness (generalized): Secondary | ICD-10-CM

## 2016-11-02 DIAGNOSIS — R293 Abnormal posture: Secondary | ICD-10-CM

## 2016-11-02 NOTE — Therapy (Signed)
[place], [place], [address] Phone: [phone]   Fax:  [phone]  Physical Therapy Treatment  Patient Details  Name: [name] MRN: [id number] Date of Birth: Jun 02, 1940 Referring Provider: Sarina Ill  Encounter Date: 11/02/2016      PT End of Session - 11/02/16 1018    Visit Number 2   Number of Visits 12   Date for PT Re-Evaluation 11/30/16   Authorization Type UHC Medicare   Authorization Time Period 11/30/16   PT Start Time 1017   PT Stop Time 1125   PT Time Calculation (min) 68 min   Activity Tolerance Patient tolerated treatment well   Behavior During Therapy [facility] for tasks assessed/performed      Past Medical History:  Diagnosis Date  . Acute myocardial infarction, unspecified site, episode of care unspecified   . Adenomatous colon polyp   . Anxiety   . Chest pain   . Coronary artery disease   . Diabetes mellitus   . GERD (gastroesophageal reflux disease)   . Hiatal hernia   . Hyperlipidemia   . Hypertension   . PONV (postoperative nausea and vomiting)   . Schatzki's ring   . Shoulder injury    resolved after shoulder surgery  . Unspecified asthma(493.90)   . Upper respiratory symptom    02-13-14 "runny nose, mild cough,sniffles" -instructed to inform MD if symptoms worsens    Past Surgical History:  Procedure Laterality Date  . ABDOMINAL HYSTERECTOMY    . APPENDECTOMY     came out with Hysterectomy  . CARDIAC CATHETERIZATION  07/23/2009   EF 60%  . CARDIAC CATHETERIZATION  10/11/2008  . CARDIAC CATHETERIZATION  03/01/2007   EF 75-80%  . CARDIOVASCULAR STRESS TEST  11/15/2007   EF 60%  . COLONOSCOPY    . CORONARY ARTERY BYPASS GRAFT     SEVERELY DISEASED SAPHENOUS VEIN GRAFT TO THE RIGHT CORONARY ARTERY BUT WITH FAIRLY WELL PRESERVED FLOW TO THE DISTAL RIGHT CORONARY ARTERY FROM THE NATIVE CIRCULATION-RESTART  CATH IN JUNE 2000, REVEALS MILD/MODERATE  CAD WITH GOOD FLOW  DOWN HER LAD  . ESOPHAGEAL DILATION    . ESOPHAGOGASTRODUODENOSCOPY    . EYE SURGERY     bilateral cataract surgery with lens implant  . lense removal Left   . ROTATOR CUFF REPAIR     right and left  . TONSILLECTOMY     age 35  . 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 tumor removed kidney    . US ECHOCARDIOGRAPHY  03/08/2008   EF 55-60%    There were no vitals filed for this visit.      Subjective Assessment - 11/02/16 1020    Subjective I am still having headaches..  On the 3rd day after my TDN, I did better. but now I am back with my headaches since yesterday   Pertinent History sleep apnea,  neck pain for 2 and 1/2 years, CABG x 3 6 years ago., GERD  see full history, DM, TSR right , ringing in my ears   Patient Stated Goals I want the headaches to go away and my neck pain to go away   Currently in Pain? Yes   Pain Score 6   on tylenol   Pain Location Neck  headache/ back of neck   Pain Orientation Right;Left;Lower   Pain  Descriptors / Indicators Sharp   Pain Type Chronic pain   Pain Onset More than a month ago   Pain Frequency Intermittent                         [facility] Adult PT Treatment/Exercise - 11/02/16 1035      Self-Care   Self-Care Other Self-Care Comments   Other Self-Care Comments  use of two tennis balls in tube sock to use for home suboccipital release     Shoulder Exercises: Supine   Other Supine Exercises supine scapular stabilizers in flex bil, horizontal abd bil diagonals,and ER bil x 10 each     Modalities   Modalities Moist Heat     Moist Heat Therapy   Number Minutes Moist Heat 15 Minutes   Moist Heat Location Cervical     Electrical Stimulation   Electrical Stimulation Location bil cervical and upper traps bil   Electrical Stimulation Action IFC   Electrical Stimulation Parameters 10  ma bil to pt tolerance   Electrical Stimulation Goals Pain     Manual Therapy   Manual Therapy Soft tissue mobilization   Manual therapy comments very tender trigger points in Upper trap levator   Joint Mobilization gentle grade 2 mobs PA and lateral PA bil   Soft tissue mobilization bil upper trap and levatore, sub occipital release     Neck Exercises: Stretches   Neck Stretch 5 reps;10 seconds   Neck Stretch Limitations use of two tennis balls with stretch          Trigger Point Dry Needling - 11/02/16 1038    Consent Given? Yes   Education Handout Provided --  verbally   Muscles Treated Upper Body Oblique capitus;Suboccipitals muscle group;Levator scapulae;Upper trapezius;Longissimus  right worse than left but bil for all TDN   Upper Trapezius Response Twitch reponse elicited;Palpable increased muscle length   Oblique Capitus Response Twitch response elicited;Palpable increased muscle length   SubOccipitals Response Twitch response elicited;Palpable increased muscle length   Levator Scapulae Response Twitch response elicited;Palpable increased muscle length   Longissimus Response Twitch response elicited;Palpable increased muscle length  erector spinae C 3 to C5 bil              PT Education - 11/02/16 1029    Education provided Yes   Education Details use of tennis ball for suboccipital release, sub scapular stabilizer with red t band , reedcuation on TDN   Person(s) Educated Patient   Methods Explanation;Demonstration;Tactile cues;Verbal cues;Handout   Comprehension Verbalized understanding;Returned demonstration             PT Long Term Goals - 11/02/16 1041      PT LONG TERM GOAL #1   Title "Demonstrate and verbalize techniques to reduce the risk of re-injury including: lifting, posture, body mechanics.    Baseline intial education   Time 6   Period Weeks   Status On-going     PT LONG TERM GOAL #2   Title "Pt will be independent with advanced HEP.     Baseline Given intial HEP sub scapular stabilizers and neck stretches   Time 6   Period Weeks   Status On-going     PT LONG TERM GOAL #3   Title "Improved cervical rotation to at least 45 degrees bil. 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    Time 6   Period Weeks   Status Unable to assess     PT LONG TERM GOAL #6   Title Pt will be able to complete household chores without exacerbating pain in neck/headache above 2/10 pain level   Baseline Pain 6/10 today   Time 6   Period Weeks   Status On-going               Plan - 11/02/16 1117    Clinical Impression Statement [name] presents with 6/10 pain level from 10/10 last visit but she reports she felt sore after TDN and better 3rd day.  She begain having a headaches yesterday and very tender bil cervical paraspinals Right greater than left.  right UE ER noted to have weakness probably from post surgery total shouder which explains over working of right upper trap.  Given HEP today to address sub scapular weakness and given tennis balls for self myofascilal relaease.  Will continue with progressive strengthening of neck and subscapular muscles,  Pt is more aware of posture today as per pt report   Rehab Potential Good   PT Frequency 2x / week   PT Duration 6 weeks   PT Treatment/Interventions Taping;Dry needling;Passive range of motion;Manual techniques;Therapeutic activities;Therapeutic exercise;Neuromuscular re-education;Patient/family education;Ultrasound;Moist  Heat;Traction;Iontophoresis 4mg /ml Dexamethasone;Electrical Stimulation;Cryotherapy   PT Next Visit Plan .  Assess TDN benefit,  self mobilizing with neck and towel   PT Home Exercise Plan neck retraction Upper trap and levator stretch.  infor on TDN, tennis ball self myofascial, sub scapular. strength red t band   Consulted and Agree with Plan of Care Patient      Patient will benefit from skilled therapeutic intervention in order to improve the following deficits and impairments:  Pain, Impaired UE functional use, Improper body mechanics, Postural dysfunction, Increased muscle spasms, Decreased strength, Decreased range of motion  Visit Diagnosis: Cervicalgia  Abnormal posture  Muscle weakness (generalized)     Problem List Patient Active Problem List   Diagnosis Date Noted  . Pancreatic insufficiency 10/28/2016  . Carotid artery disease ([place]) 05/19/2016  . Fatigue 05/19/2016  . Anemia 03/27/2016  . Severe persistent asthma 03/18/2016  . Type 1 diabetes mellitus ([place]) 07/21/2015  . Vitamin B12 deficiency 03/31/2015  . OSA (obstructive sleep apnea) 11/20/2014  . Fecal incontinence 08/28/2014  . 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 Falls 08/28/2014  . Neck pain of over 3 months duration 08/28/2014  . H/O total knee replacement 02/20/2014  . Obesity (BMI 30-39.9) 11/12/2013  . Chronic diastolic CHF (congestive heart failure) ([place]) 11/17/2012  . Meniscus, lateral, anterior horn derangement 11/10/2011  . Medial meniscus, posterior horn derangement 11/10/2011  . Osteoarthritis of right knee 11/10/2011  . Vertigo 10/05/2011  . HTN (hypertension), benign 10/05/2011  . Coronary artery disease 08/27/2010  . MUSCLE CRAMPS 12/29/2009  . HEART ATTACK 11/19/2009  . Extrinsic asthma 11/19/2009  . GERD 11/19/2009  . Hyperlipidemia 11/18/2009    Voncille Lo, PT Certified Exercise Expert for the Aging Adult  11/02/16  11:22 AM Phone: [phone] Fax:  [facility] [address] [place], [place], [address] Phone: [phone]   Fax:  [phone]  Name: [name] MRN: [id number] Date of Birth: 12/12/1940

## 2016-11-02 NOTE — Telephone Encounter (Signed)
New message   Pt states that she was told if she has questions on how to administer any medications, to just come to the office. I advised her that I would send a message and someone would call her back.   Pt c/o medication issue:  1. Name of Medication: Evolocumab (REPATHA SURECLICK) 229 MG/ML SOAJ  2. How are you currently taking this medication (dosage and times per day)?   3. Are you having a reaction (difficulty breathing--STAT)? no  4. What is your medication issue? Pt does not know how to administer this medication

## 2016-11-02 NOTE — Patient Instructions (Signed)
Use a towel roll behind neck for support  Over Head Pull: Narrow Grip       On back, knees bent, feet flat, band across thighs, elbows straight but relaxed. Pull hands apart (start). Keeping elbows straight, bring arms up and over head, hands toward floor. Keep pull steady on band. Hold momentarily. Return slowly, keeping pull steady, back to start. Repeat _10__ times. Band color __red____   Side Pull: Double Arm   On back, knees bent, feet flat. Arms perpendicular to body, shoulder level, elbows straight but relaxed. Pull arms out to sides, elbows straight. Resistance band comes across collarbones, hands toward floor. Hold momentarily. Slowly return to starting position. Repeat 10___ times. Band color __red___   [name]   On back, knees bent, feet flat, left hand on left hip, right hand above left. Pull right arm DIAGONALLY (hip to shoulder) across chest. Bring right arm along head toward floor. Hold momentarily. Slowly return to starting position. Repeat 10___ times. Do with left arm. Band color __red____   Shoulder Rotation: Double Arm   On back, knees bent, feet flat, elbows tucked at sides, bent 90, hands palms up. Pull hands apart and down toward floor, keeping elbows near sides. Hold momentarily. Slowly return to starting position. Repeat 10___ times. Band color red______   Flexors, Supine    Lie on back, head on small, rolled towel You have a tennis balls in tube sock insteach. Nod head, tipping chin down. Tighten muscles in back of throat. Hold __10_ seconds. Repeat _5__ times per session. Do 2-3___ sessions per day.  Copyright  VHI. All rights reserved.    [name], PT Certified Exercise Expert for the Aging Adult  11/02/16 10:25 AM Phone: [phone] Fax: [phone]

## 2016-11-03 LAB — HM MAMMOGRAPHY

## 2016-11-03 NOTE — Telephone Encounter (Signed)
LMOM for pt to return call to see if she would like to come to clinic for [place] injection training.

## 2016-11-04 ENCOUNTER — Encounter: Payer: Self-pay | Admitting: Physical Therapy

## 2016-11-04 ENCOUNTER — Ambulatory Visit: Payer: Medicare Other | Admitting: Physical Therapy

## 2016-11-04 DIAGNOSIS — R293 Abnormal posture: Secondary | ICD-10-CM

## 2016-11-04 DIAGNOSIS — M542 Cervicalgia: Secondary | ICD-10-CM | POA: Diagnosis not present

## 2016-11-04 DIAGNOSIS — M6281 Muscle weakness (generalized): Secondary | ICD-10-CM

## 2016-11-04 NOTE — Telephone Encounter (Signed)
Pt will come to clinic tomorrow afternoon at 1:30pm for Repatha injection training.

## 2016-11-04 NOTE — Patient Instructions (Addendum)

## 2016-11-04 NOTE — Therapy (Signed)
[place], [place], [address] Phone: [phone]   Fax:  [phone]  Physical Therapy Treatment  Patient Details  Name: [name] MRN: [id number] Date of Birth: 09/15/1940 Referring Provider: Sarina Ill  Encounter Date: 11/04/2016      PT End of Session - 11/04/16 1105    Visit Number 3   Number of Visits 12   Date for PT Re-Evaluation 11/30/16   Authorization Type UHC Medicare   Authorization Time Period 11/30/16   PT Start Time 1025   PT Stop Time 1115   PT Time Calculation (min) 50 min   Activity Tolerance Patient tolerated treatment well   Behavior During Therapy [facility] for tasks assessed/performed      Past Medical History:  Diagnosis Date  . Acute myocardial infarction, unspecified site, episode of care unspecified   . Adenomatous colon polyp   . Anxiety   . Chest pain   . Coronary artery disease   . Diabetes mellitus   . GERD (gastroesophageal reflux disease)   . Hiatal hernia   . Hyperlipidemia   . Hypertension   . PONV (postoperative nausea and vomiting)   . Schatzki's ring   . Shoulder injury    resolved after shoulder surgery  . Unspecified asthma(493.90)   . Upper respiratory symptom    02-13-14 "runny nose, mild cough,sniffles" -instructed to inform MD if symptoms worsens    Past Surgical History:  Procedure Laterality Date  . ABDOMINAL HYSTERECTOMY    . APPENDECTOMY     came out with Hysterectomy  . CARDIAC CATHETERIZATION  07/23/2009   EF 60%  . CARDIAC CATHETERIZATION  10/11/2008  . CARDIAC CATHETERIZATION  03/01/2007   EF 75-80%  . CARDIOVASCULAR STRESS TEST  11/15/2007   EF 60%  . COLONOSCOPY    . CORONARY ARTERY BYPASS GRAFT     SEVERELY DISEASED SAPHENOUS VEIN GRAFT TO THE RIGHT CORONARY ARTERY BUT WITH FAIRLY WELL PRESERVED FLOW TO THE DISTAL RIGHT CORONARY ARTERY FROM THE NATIVE CIRCULATION-RESTART  CATH IN JUNE 2000, REVEALS MILD/MODERATE  CAD WITH GOOD FLOW  DOWN HER LAD  . ESOPHAGEAL DILATION    . ESOPHAGOGASTRODUODENOSCOPY    . EYE SURGERY     bilateral cataract surgery with lens implant  . lense removal Left   . ROTATOR CUFF REPAIR     right and left  . TONSILLECTOMY     age 65  . 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 tumor removed kidney    . US ECHOCARDIOGRAPHY  03/08/2008   EF 55-60%    There were no vitals filed for this visit.      Subjective Assessment - 11/04/16 1023    Subjective I wake up every day with a headache,  right now you are a 3 out of 10.  Exercises since Tuesday are good.    Pertinent History sleep apnea,  neck pain for 2 and 1/2 years, CABG x 3 6 years ago., GERD  see full history, DM, TSR right , ringing in my ears   Diagnostic tests none   Patient Stated Goals I want the headaches to go away and my neck pain to go away   Currently in Pain? Yes   Pain Score 3    Pain Location Neck   Pain Orientation Right;Left   Pain Descriptors / Indicators Aching;Tightness  Pain Type Chronic pain   Pain Onset More than a month ago   Pain Frequency Intermittent            [facility] PT Assessment - 11/04/16 1053      AROM   Cervical Flexion 40   Cervical Extension 49   Cervical - Right Side Bend 30   Cervical - Left Side Bend 42   Cervical - Right Rotation 43   Cervical - Left Rotation 42                     [facility] Adult PT Treatment/Exercise - 11/04/16 1026      Self-Care   Self-Care Posture;ADL's;Lifting   ADL's went over handout together   Lifting PT demo and pt verbalized about only carrying laundry basket   Posture reviewed posture for household chores    Other Self-Care Comments  Pt not lifting due to heart can lift clothes basket. discussed getting home TENS unit that she can purchase herself if she wants to.     Neck Exercises: Seated   Other  Seated Exercise Neck SNAGS wit towel 5 x 10 sec hold with towel and rotation with 10 sec hold   bil forward pull with neck retraction for self sub occipital release     Modalities   Modalities Moist Heat     Moist Heat Therapy   Number Minutes Moist Heat 15 Minutes   Moist Heat Location Cervical     Electrical Stimulation   Electrical Stimulation Location bil cervical and upper traps bil   Electrical Stimulation Action IFC   Electrical Stimulation Parameters 11 ma bil to pt tolerance   Electrical Stimulation Goals Pain     Manual Therapy   Manual Therapy Soft tissue mobilization   Manual therapy comments very tender trigger points in Upper trap levator   Joint Mobilization gentle grade 2 mobs PA and lateral PA bil   Soft tissue mobilization bil upper trap and levatore, sub occipital release     Neck Exercises: Stretches   Upper Trapezius Stretch 3 reps;30 seconds   Upper Trapezius Stretch Limitations bil   Levator Stretch 3 reps;30 seconds   Levator Stretch Limitations bil   Neck Stretch 5 reps;10 seconds   Neck Stretch Limitations retraction with towel roll                PT Education - 11/04/16 1035    Education provided Yes   Education Details POsture and body mechanics and lifting.  Handout for headaches and self SNAGS with towel   Person(s) Educated Patient   Methods Explanation;Demonstration;Tactile cues;Verbal cues;Handout   Comprehension Verbalized understanding             PT Long Term Goals - 11/02/16 1041      PT LONG TERM GOAL #1   Title "Demonstrate and verbalize techniques to reduce the risk of re-injury including: lifting, posture, body mechanics.    Baseline intial education   Time 6   Period Weeks   Status On-going     PT LONG TERM GOAL #2   Title "Pt will be independent with advanced HEP.    Baseline Given intial HEP sub scapular stabilizers and neck stretches   Time 6   Period Weeks   Status On-going     PT LONG TERM GOAL #3    Title "Improved cervical rotation to at least 45 degrees bil. 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    Time 6   Period Weeks   Status Unable to assess     PT LONG TERM GOAL #6   Title Pt will be able to complete household chores without exacerbating pain in neck/headache above 2/10 pain level   Baseline Pain 6/10 today   Time 6   Period Weeks   Status On-going               Plan - 11/04/16 1101    Clinical Impression Statement Pt asked to not do TDN due to neck soreness.  Pt did say she was a 3/10 pain level better than since evaluation.  AROM of neck improved from eval. See flowsheet.  Pt was able to sleep 25% better than before evaluation  Will continue with neck strengthening.  Pt given ADL, posture and body mechanics handout and handout for headache and Self SNAGS   Pt will be ready to progress.   Rehab Potential Good   PT Frequency 2x / week   PT Duration 6 weeks   PT Treatment/Interventions Taping;Dry needling;Passive range of motion;Manual techniques;Therapeutic activities;Therapeutic exercise;Neuromuscular re-education;Patient/family education;Ultrasound;Moist Heat;Traction;Iontophoresis 4mg /ml Dexamethasone;Electrical Stimulation;Cryotherapy   PT Next Visit Plan assess self mobiloizing with towel   due standing scapular strengthe   PT Home Exercise Plan neck retraction Upper trap and levator stretch.  infor on TDN, tennis ball self myofascial, sub scapular. strength red t band, self SNAGS with towel   Consulted and Agree with  Plan of Care Patient      Patient will benefit from skilled therapeutic intervention in order to improve the following deficits and impairments:  Pain, Impaired UE functional use, Improper body mechanics, Postural dysfunction, Increased muscle spasms, Decreased strength, Decreased range of motion  Visit Diagnosis: Cervicalgia  Abnormal posture  Muscle weakness (generalized)     Problem List Patient Active Problem List   Diagnosis Date Noted  . Pancreatic insufficiency 10/28/2016  . Carotid artery disease ([place]) 05/19/2016  . Fatigue 05/19/2016  . Anemia 03/27/2016  . Severe persistent asthma 03/18/2016  . Type 1 diabetes mellitus ([place]) 07/21/2015  . Vitamin B12 deficiency 03/31/2015  . OSA (obstructive sleep apnea) 11/20/2014  . Fecal incontinence 08/28/2014  . 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 Falls 08/28/2014  . Neck pain of over 3 months duration 08/28/2014  . H/O total knee replacement 02/20/2014  . Obesity (BMI 30-39.9) 11/12/2013  . Chronic diastolic CHF (congestive heart failure) ([place]) 11/17/2012  . Meniscus, lateral, anterior horn derangement 11/10/2011  . Medial meniscus, posterior horn derangement 11/10/2011  . Osteoarthritis of right knee 11/10/2011  . Vertigo 10/05/2011  . HTN (hypertension), benign 10/05/2011  . Coronary artery disease 08/27/2010  . MUSCLE CRAMPS 12/29/2009  . HEART ATTACK 11/19/2009  . Extrinsic asthma 11/19/2009  . GERD 11/19/2009  . Hyperlipidemia 11/18/2009   Voncille Lo, PT Certified Exercise Expert for the Aging Adult  11/04/16 11:06 AM Phone: [phone] Fax: [facility] [address]  [place], [place], [address] Phone: [phone]   Fax:  [phone]  Name: [name] MRN: [id number] Date of Birth: [birth year]

## 2016-11-04 NOTE — Telephone Encounter (Signed)
LMOM again for pt. 

## 2016-11-05 ENCOUNTER — Other Ambulatory Visit: Payer: Self-pay | Admitting: Physician Assistant

## 2016-11-05 ENCOUNTER — Telehealth: Payer: Self-pay | Admitting: Pharmacist

## 2016-11-05 DIAGNOSIS — E782 Mixed hyperlipidemia: Secondary | ICD-10-CM

## 2016-11-05 NOTE — Telephone Encounter (Signed)
Pt presents to clinic today for Repatha injection training. She successfully self administered her injection into her right outer thigh with no complications. F/u lab work scheduled in 8 weeks. Pt aware to contact [facility] for refills each month and will call clinic with any adverse effects.

## 2016-11-11 ENCOUNTER — Encounter: Payer: Self-pay | Admitting: Internal Medicine

## 2016-11-11 ENCOUNTER — Encounter: Payer: Self-pay | Admitting: Physical Therapy

## 2016-11-11 ENCOUNTER — Ambulatory Visit: Payer: Medicare Other | Attending: Neurology | Admitting: Physical Therapy

## 2016-11-11 DIAGNOSIS — R293 Abnormal posture: Secondary | ICD-10-CM

## 2016-11-11 DIAGNOSIS — M6281 Muscle weakness (generalized): Secondary | ICD-10-CM | POA: Insufficient documentation

## 2016-11-11 DIAGNOSIS — M542 Cervicalgia: Secondary | ICD-10-CM | POA: Diagnosis not present

## 2016-11-11 NOTE — Patient Instructions (Signed)
TENS stands for Transcutaneous Electrical Nerve Stimulation. In other words, electrical impulses are allowed to pass through the skin in order to excite a nerve.   Purpose and Use of TENS:  TENS is a method used to manage acute and chronic pain without the use of drugs. It has been effective in managing pain associated with surgery, sprains, strains, trauma, rheumatoid arthritis, and neuralgias. It is a non-addictive, low risk, and non-invasive technique used to control pain. It is not, by any means, a curative form of treatment.   How TENS Works:  Most TENS units are a Paramedic unit powered by one 9 volt battery. Attached to the outside of the unit are two lead wires where two pins and/or snaps connect on each wire. All units come with a set of four reusable pads or electrodes. These are placed on the skin surrounding the area involved. By inserting the leads into  the pads, the electricity can pass from the unit making the circuit complete.  As the intensity is turned up slowly, the electrical current enters the body from the electrodes through the skin to the surrounding nerve fibers. This triggers the release of hormones from within the body. These hormones contain pain relievers. By increasing the circulation of these hormones, the person's pain may be lessened. It is also believed that the electrical stimulation itself helps to block the pain messages being sent to the brain, thus also decreasing the body's perception of pain.   Hazards:  TENS units are NOT to be used by patients with PACEMAKERS, DEFIBRILLATORS, DIABETIC PUMPS, PREGNANT WOMEN, and patients with SEIZURE DISORDERS.  TENS units are NOT to be used over the heart, throat, brain, or spinal cord.  One of the major side effects from the TENS unit may be skin irritation. Some people may develop a rash if they are sensitive to the materials used in the electrodes or the connecting wires.     Avoid overuse due the body getting  used to the stem making it not as effective over time.     Your unit you can use TENS or IF   , You can change with mode button, Voncille Lo, PT Certified Exercise Expert for the Aging Adult  11/11/16 10:52 AM Phone: [phone] Fax: [phone]

## 2016-11-11 NOTE — Therapy (Signed)
[place], [place], [address] Phone: [phone]   Fax:  [phone]  Physical Therapy Treatment  Patient Details  Name: [name] MRN: [id number] Date of Birth: [birth year] Referring Provider: Sarina Ill  Encounter Date: 11/11/2016      PT End of Session - 11/11/16 1026    Visit Number 4   Number of Visits 12   Date for PT Re-Evaluation 11/30/16   Authorization Type UHC Medicare   Authorization Time Period 11/30/16   PT Start Time 1025   Activity Tolerance Patient tolerated treatment well   Behavior During Therapy [facility] for tasks assessed/performed      Past Medical History:  Diagnosis Date  . Acute myocardial infarction, unspecified site, episode of care unspecified   . Adenomatous colon polyp   . Anxiety   . Chest pain   . Coronary artery disease   . Diabetes mellitus   . GERD (gastroesophageal reflux disease)   . Hiatal hernia   . Hyperlipidemia   . Hypertension   . PONV (postoperative nausea and vomiting)   . Schatzki's ring   . Shoulder injury    resolved after shoulder surgery  . Unspecified asthma(493.90)   . Upper respiratory symptom    02-13-14 "runny nose, mild cough,sniffles" -instructed to inform MD if symptoms worsens    Past Surgical History:  Procedure Laterality Date  . ABDOMINAL HYSTERECTOMY    . APPENDECTOMY     came out with Hysterectomy  . CARDIAC CATHETERIZATION  07/23/2009   EF 60%  . CARDIAC CATHETERIZATION  10/11/2008  . CARDIAC CATHETERIZATION  03/01/2007   EF 75-80%  . CARDIOVASCULAR STRESS TEST  11/15/2007   EF 60%  . COLONOSCOPY    . CORONARY ARTERY BYPASS GRAFT     SEVERELY DISEASED SAPHENOUS VEIN GRAFT TO THE RIGHT CORONARY ARTERY BUT WITH FAIRLY WELL PRESERVED FLOW TO THE DISTAL RIGHT CORONARY ARTERY FROM THE NATIVE CIRCULATION-RESTART  CATH IN JUNE 2000, REVEALS MILD/MODERATE  CAD WITH GOOD FLOW DOWN HER LAD  . ESOPHAGEAL DILATION    .  ESOPHAGOGASTRODUODENOSCOPY    . EYE SURGERY     bilateral cataract surgery with lens implant  . lense removal Left   . ROTATOR CUFF REPAIR     right and left  . TONSILLECTOMY     age 26  . 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 tumor removed kidney    . US ECHOCARDIOGRAPHY  03/08/2008   EF 55-60%    There were no vitals filed for this visit.      Subjective Assessment - 11/11/16 1025    Subjective I wake up every day with pain and soreness. I have headaches every day.    Currently in Pain? Yes   Pain Score 7    Pain Location Neck   Pain Descriptors / Indicators Aching;Tightness   Pain Type Chronic pain   Pain Onset More than a month ago   Pain Frequency Intermittent   Multiple Pain Sites Yes   Pain Score 8   Pain Location Head   Pain Orientation Left;Right   Pain Descriptors / Indicators Aching;Spasm   Pain Type Chronic pain   Pain Onset More than a month ago   Pain Frequency Constant   Aggravating Factors  sitting  [place] Adult PT Treatment/Exercise - 11/11/16 1017      Self-Care   Other Self-Care Comments  TEN Education and distribution of TENS uniti     Shoulder Exercises: Supine   Other Supine Exercises --     Modalities   Modalities Moist Heat     Moist Heat Therapy   Number Minutes Moist Heat 15 Minutes   Moist Heat Location Cervical     Electrical Stimulation   Electrical Stimulation Location bil cervical and upper traps bil   Electrical Stimulation Action IFC   Electrical Stimulation Parameters 12 ma to pt tolerance   Electrical Stimulation Goals Pain     Manual Therapy   Manual Therapy Soft tissue mobilization   Manual therapy comments pt with very tender trigger points barely able to handle light touch   Soft tissue mobilization bil upper trap and levator and cervical  paraspinals                PT Education - 11/11/16 1450    Education provided Yes   Education Details TENS education and unit   Person(s) Educated Patient   Methods Explanation;Demonstration;Handout;Verbal cues   Comprehension Verbalized understanding;Returned demonstration             PT Long Term Goals - 11/11/16 1443      PT LONG TERM GOAL #1   Title "Demonstrate and verbalize techniques to reduce the risk of re-injury including: lifting, posture, body mechanics.    Baseline Pt is aware of posture but demonstrates gaurding today in clinic   Time 6   Period Weeks   Status On-going     PT LONG TERM GOAL #2   Title "Pt will be independent with advanced HEP.    Baseline Given intial HEP sub scapular stabilizers and neck stretches / unable to do exercises today due to pain   Time 6   Period Weeks   Status On-going     PT LONG TERM GOAL #3   Title "Improved cervical rotation to at least 45 degrees bil. 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    Time 6   Period Weeks   Status Unable to assess     PT LONG TERM GOAL #6   Title Pt will be able to complete household chores without exacerbating pain in neck/headache above 2/10 pain level   Baseline 7/10 neck and 8/10 headache   Time 6   Period Weeks   Status On-going               Plan - 11/11/16 1445    Clinical Impression Statement Pt enters clinic with gaurding of neck and forward head and rounded  shoulders neck 7/10 and 8/10 headache.  she is most disturbed by headache symptoms and feels like she is getting no relief even though she is doing her exercises at home.  Pt does have spasms of her upper traps and cane barely tolerate light touch.  She does seem to do well with E stim IFC and today was given TENS unit to use at home which seemed to provide her with relief.  Pt will attend another session of PT to see if she can break this pain cycle before returning to MD for further evaluation.    Rehab Potential Good   PT Frequency 2x / week   PT Duration 6 weeks   PT Treatment/Interventions Taping;Dry needling;Passive range of motion;Manual techniques;Therapeutic activities;Therapeutic exercise;Neuromuscular re-education;Patient/family education;Ultrasound;Moist Heat;Traction;Iontophoresis 4mg /ml Dexamethasone;Electrical Stimulation;Cryotherapy   PT Next Visit Plan Assess TENS benefit and continue with progressive PT/ possible traction next visit to relieve neck symptoms   PT Home Exercise Plan neck retraction Upper trap and levator stretch.  infor on TDN, tennis ball self myofascial, sub scapular. strength red t band, self SNAGS with towel TENS   Consulted and Agree with Plan of Care Patient      Patient will benefit from skilled therapeutic intervention in order to improve the following deficits and impairments:  Pain, Impaired UE functional use, Improper body mechanics, Postural dysfunction, Increased muscle spasms, Decreased strength, Decreased range of motion  Visit Diagnosis: Cervicalgia  Abnormal posture  Muscle weakness (generalized)     Problem List Patient Active Problem List   Diagnosis Date Noted  . Pancreatic insufficiency 10/28/2016  . Carotid artery disease ([place]) 05/19/2016  . Fatigue 05/19/2016  . Anemia 03/27/2016  . Severe persistent asthma 03/18/2016  . Type 1 diabetes mellitus ([place]) 07/21/2015  . Vitamin B12 deficiency 03/31/2015  . OSA (obstructive sleep  apnea) 11/20/2014  . Fecal incontinence 08/28/2014  . 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 Falls 08/28/2014  . Neck pain of over 3 months duration 08/28/2014  . H/O total knee replacement 02/20/2014  . Obesity (BMI 30-39.9) 11/12/2013  . Chronic diastolic CHF (congestive heart failure) ([place]) 11/17/2012  . Meniscus, lateral, anterior horn derangement 11/10/2011  . Medial meniscus, posterior horn derangement 11/10/2011  . Osteoarthritis of right knee 11/10/2011  . Vertigo 10/05/2011  . HTN (hypertension), benign 10/05/2011  . Coronary artery disease 08/27/2010  . MUSCLE CRAMPS 12/29/2009  . HEART ATTACK 11/19/2009  . Extrinsic asthma 11/19/2009  . GERD 11/19/2009  . Hyperlipidemia 11/18/2009    Voncille Lo, PT Certified Exercise Expert for the Aging Adult  11/11/16 2:51 PM Phone: [phone] Fax: [facility] [address] [place], [place], [address] Phone: [phone]   Fax:  [phone]  Name: [name] MRN: [id number] Date of Birth: 1940-08-17

## 2016-11-18 ENCOUNTER — Ambulatory Visit: Payer: Medicare Other | Admitting: Physical Therapy

## 2016-11-18 DIAGNOSIS — M542 Cervicalgia: Secondary | ICD-10-CM | POA: Diagnosis not present

## 2016-11-18 DIAGNOSIS — R293 Abnormal posture: Secondary | ICD-10-CM

## 2016-11-18 DIAGNOSIS — M6281 Muscle weakness (generalized): Secondary | ICD-10-CM

## 2016-11-18 NOTE — Patient Instructions (Addendum)

## 2016-11-18 NOTE — Therapy (Signed)
[place], [place], [address] Phone: [phone]   Fax:  [phone]  Physical Therapy Treatment  Patient Details  Name: [name] MRN: [id number] Date of Birth: June 08, 1940 Referring Provider: Sarina Ill  Encounter Date: 11/18/2016      PT End of Session - 11/18/16 1035    Visit Number 5   Number of Visits 12   Date for PT Re-Evaluation 11/30/16   Authorization Type UHC Medicare   Authorization Time Period 11/30/16   PT Start Time 1033   PT Stop Time 1105   PT Time Calculation (min) 32 min   Activity Tolerance Patient tolerated treatment well   Behavior During Therapy [facility] for tasks assessed/performed      Past Medical History:  Diagnosis Date  . Acute myocardial infarction, unspecified site, episode of care unspecified   . Adenomatous colon polyp   . Anxiety   . Chest pain   . Coronary artery disease   . Diabetes mellitus   . GERD (gastroesophageal reflux disease)   . Hiatal hernia   . Hyperlipidemia   . Hypertension   . PONV (postoperative nausea and vomiting)   . Schatzki's ring   . Shoulder injury    resolved after shoulder surgery  . Unspecified asthma(493.90)   . Upper respiratory symptom    02-13-14 "runny nose, mild cough,sniffles" -instructed to inform MD if symptoms worsens    Past Surgical History:  Procedure Laterality Date  . ABDOMINAL HYSTERECTOMY    . APPENDECTOMY     came out with Hysterectomy  . CARDIAC CATHETERIZATION  07/23/2009   EF 60%  . CARDIAC CATHETERIZATION  10/11/2008  . CARDIAC CATHETERIZATION  03/01/2007   EF 75-80%  . CARDIOVASCULAR STRESS TEST  11/15/2007   EF 60%  . COLONOSCOPY    . CORONARY ARTERY BYPASS GRAFT     SEVERELY DISEASED SAPHENOUS VEIN GRAFT TO THE RIGHT CORONARY ARTERY BUT WITH FAIRLY WELL PRESERVED FLOW TO THE DISTAL RIGHT CORONARY ARTERY FROM THE NATIVE CIRCULATION-RESTART  CATH IN JUNE 2000, REVEALS MILD/MODERATE  CAD WITH GOOD FLOW  DOWN HER LAD  . ESOPHAGEAL DILATION    . ESOPHAGOGASTRODUODENOSCOPY    . EYE SURGERY     bilateral cataract surgery with lens implant  . lense removal Left   . ROTATOR CUFF REPAIR     right and left  . TONSILLECTOMY     age 29  . 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 tumor removed kidney    . US ECHOCARDIOGRAPHY  03/08/2008   EF 55-60%    There were no vitals filed for this visit.      Subjective Assessment - 11/18/16 1050    Subjective I love the TENS unit  I use it every day.   My headache today is 10/10.  after doing exercises  7/10    but headache is most limiting to her lift   Pertinent History sleep apnea,  neck pain for 2 and 1/2 years, CABG x 3 6 years ago., GERD  see full history, DM, TSR right , ringing in my ears   Currently in Pain? Yes   Pain Score 10-Worst pain ever  7 after exercises   Pain Location Neck   Pain Orientation Right;Left   Pain Onset More than a month ago   Pain Frequency Intermittent  Pain Score 10   Pain Location Head   Pain Orientation Right;Left   Pain Descriptors / Indicators Aching;Spasm   Pain Type Chronic pain                         [facility] Adult PT Treatment/Exercise - 11/18/16 1048      Neck Exercises: Seated   Other Seated Exercise Neck SNAGS wit towel 5 x 10 sec hold with towel and rotation with 10 sec hold   bil forward pull with neck retraction for self sub occipital release     Shoulder Exercises: Standing   External Rotation Strengthening;Both;15 reps;Theraband   Theraband Level (Shoulder External Rotation) Level 3 (Green)   External Rotation Limitations Pt with total shoulder on right with weakness and compensation   Extension Strengthening;Both;15 reps;Theraband   Theraband Level (Shoulder Extension) Level 3 (Green)   Row Theraband;Strengthening;Both;15 reps    Theraband Level (Shoulder Row) Level 3 (Green)     Iontophoresis   Type of Iontophoresis Dexamethasone   Location bil upper trap cervical ends   Dose 1 cc x 2   Time patch 4 hours and remove     Manual Therapy   Manual Therapy Soft tissue mobilization   Soft tissue mobilization bil upper trap and levator and cervical paraspinals     Neck Exercises: Stretches   Neck Stretch 5 reps;10 seconds   Neck Stretch Limitations retraction with towel roll   Other Neck Stretches cervical towel rotation stretch 3 x 20 sec ond hold x 3 right and left                PT Education - 11/18/16 1539    Education provided Yes   Education Details iontophoresis and updated HEP   Person(s) Educated Patient   Methods Explanation;Demonstration;Tactile cues;Verbal cues;Handout   Comprehension Verbalized understanding;Returned demonstration             PT Long Term Goals - 11/11/16 1443      PT LONG TERM GOAL #1   Title "Demonstrate and verbalize techniques to reduce the risk of re-injury including: lifting, posture, body mechanics.    Baseline Pt is aware of posture but demonstrates gaurding today in clinic   Time 6   Period Weeks   Status On-going     PT LONG TERM GOAL #2   Title "Pt will be independent with advanced HEP.    Baseline Given intial HEP sub scapular stabilizers and neck stretches / unable to do exercises today due to pain   Time 6   Period Weeks   Status On-going     PT LONG TERM GOAL #3   Title "Improved cervical rotation to at least 45 degrees bil. 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    Time 6   Period Weeks   Status Unable to  assess     PT LONG TERM GOAL #6   Title Pt will be able to complete household chores without exacerbating pain in neck/headache above 2/10 pain level   Baseline 7/10 neck and 8/10 headache   Time 6   Period Weeks   Status On-going               Plan - 11/18/16 1540    Clinical Impression Statement Pt enters clinic with 10/10 headache and was given exercises update and manual with decrease in pain to 7/10 pain.  Pt was educated on iontophoresis patches and given on bil cervical upper trapezius.  Pt performed upgraded HEP.  If pt is not measurably better next visit, we will return to MD for further evaluation.  Pt loves TENS unit and uses it at least twice a day.   Rehab Potential Good   PT Frequency 2x / week   PT Duration 6 weeks   PT Treatment/Interventions Taping;Dry needling;Passive range of motion;Manual techniques;Therapeutic activities;Therapeutic exercise;Neuromuscular re-education;Patient/family education;Ultrasound;Moist Heat;Traction;Iontophoresis 4mg /ml Dexamethasone;Electrical Stimulation;Cryotherapy   PT Next Visit Plan Assess TENS benefit/ iontophoreisis and continue with progressive PT/ possible traction next visit to relieve neck symptoms   PT Home Exercise Plan neck retraction Upper trap and levator stretch.  infor on TDN, tennis ball self myofascial, sub scapular. strength red t band, self SNAGS with towel TENS, standing scapular stabilizers   Consulted and Agree with Plan of Care Patient      Patient will benefit from skilled therapeutic intervention in order to improve the following deficits and impairments:  Pain, Impaired UE functional use, Improper body mechanics, Postural dysfunction, Increased muscle spasms, Decreased strength, Decreased range of motion  Visit  Diagnosis: Cervicalgia  Abnormal posture  Muscle weakness (generalized)     Problem List Patient Active Problem List   Diagnosis Date Noted  . Pancreatic insufficiency 10/28/2016  . Carotid artery disease ([place]) 05/19/2016  . Fatigue 05/19/2016  . Anemia 03/27/2016  . Severe persistent asthma 03/18/2016  . Type 1 diabetes mellitus ([place]) 07/21/2015  . Vitamin B12 deficiency 03/31/2015  . OSA (obstructive sleep apnea) 11/20/2014  . Fecal incontinence 08/28/2014  . 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 Falls 08/28/2014  . Neck pain of over 3 months duration 08/28/2014  . H/O total knee replacement 02/20/2014  . Obesity (BMI 30-39.9) 11/12/2013  . Chronic diastolic CHF (congestive heart failure) ([place]) 11/17/2012  . Meniscus, lateral, anterior horn derangement 11/10/2011  . Medial meniscus, posterior horn derangement 11/10/2011  . Osteoarthritis of right knee 11/10/2011  . Vertigo 10/05/2011  . HTN (hypertension), benign 10/05/2011  . Coronary artery disease 08/27/2010  . MUSCLE CRAMPS 12/29/2009  . HEART ATTACK 11/19/2009  . Extrinsic asthma 11/19/2009  . GERD 11/19/2009  . Hyperlipidemia 11/18/2009   Voncille Lo, PT Certified Exercise Expert for the Aging Adult  11/18/16 3:48 PM Phone: [phone] Fax: [facility] [address] [place], [place], [address] Phone: [phone]   Fax:  [phone]  Name: DAZHA KEMPA MRN: [id number] Date of Birth: 08-24-1940

## 2016-11-22 NOTE — Progress Notes (Signed)
Subjective:    Patient ID: [name], female    DOB: [birth year], 76 y.o.   MRN: [id number]  HPI The patient is here for follow up.  She has not felt well for the past month.  She does not sleep well and thinks that is contributing.  If she gets 4 hrs of sleep at night it is a good night.  She has chronic headaches.  Her balance is off - she has to be careful how she walks, which is not new.  She has little energy.    ? Sleep apnea: She had been diagnosed with sleep apnea in the past was using his CPAP machine. The machine was taken away due to noncompliance. She had another sleep test recently by neurology, but she only slept for 40 minutes and the test was inconclusive. Advised her just to work on weight loss.  Chronic headaches: She has headaches daily, but they are not intractable. She has been doing physical therapy and this is not helping. The pain comes from the back of her head on the right sideof her skull and it does radiate up to her head. She denies any numbness or tingling. Besides physical therapy she is also given intraperitoneally and there has been no improvement.   Diabetes: She is taking her medication daily as prescribed. she is following with endocrine. She is compliant with a diabetic diet. She is exercising regularly - walking or water exercises.   Hypertension: She is taking her medication daily. She is compliant with a low sodium diet.  She is having leg edema for one month.   She is exercising regularly.  She does not monitor her blood pressure at home.    GERD:  She is taking her medication daily as prescribed.  She denies any GERD symptoms and feels her GERD is well controlled.   Hyperlipidemia: She is taking her medication daily. She is compliant with a low fat/cholesterol diet. She is exercising regularly.   B12 deficiency: She is due for a B12 injection today.   Medications and allergies reviewed with patient and updated if appropriate.  Patient Active  Problem List   Diagnosis Date Noted  . Pancreatic insufficiency 10/28/2016  . Carotid artery disease ([place]) 05/19/2016  . Fatigue 05/19/2016  . Anemia 03/27/2016  . Severe persistent asthma 03/18/2016  . Type 1 diabetes mellitus ([place]) 07/21/2015  . Vitamin B12 deficiency 03/31/2015  . OSA (obstructive sleep apnea) 11/20/2014  . Fecal incontinence 08/28/2014  . 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 Falls 08/28/2014  . Neck pain of over 3 months duration 08/28/2014  . H/O total knee replacement 02/20/2014  . Obesity (BMI 30-39.9) 11/12/2013  . Chronic diastolic CHF (congestive heart failure) ([place]) 11/17/2012  . Meniscus, lateral, anterior horn derangement 11/10/2011  . Medial meniscus, posterior horn derangement 11/10/2011  . Osteoarthritis of right knee 11/10/2011  . Vertigo 10/05/2011  . HTN (hypertension), benign 10/05/2011  . Coronary artery disease 08/27/2010  . MUSCLE CRAMPS 12/29/2009  . HEART ATTACK 11/19/2009  . Extrinsic asthma 11/19/2009  . GERD 11/19/2009  . Hyperlipidemia 11/18/2009    Current Outpatient Prescriptions on File Prior to Visit  Medication Sig Dispense Refill  . albuterol (PROVENTIL HFA;VENTOLIN HFA) 108 (90 BASE) MCG/ACT inhaler Inhale 2 puffs into the lungs every 6 (six) hours as needed. For shortness of breath. 1 Inhaler 0  . albuterol (PROVENTIL) (2.5 MG/3ML) 0.083% nebulizer solution Take 3 mLs (2.5 mg total) by nebulization every 6 (six) hours as  needed for wheezing or shortness of breath. 360 mL 3  . ALPRAZolam (XANAX) 0.5 MG tablet Take 0.5 mg by mouth 2 (two) times daily as needed for anxiety.     . baclofen (LIORESAL) 20 MG tablet Take 20 mg by mouth daily.     . benzonatate (TESSALON) 100 MG capsule Take 100 mg by mouth 3 (three) times daily as needed for cough.    . Blood Glucose Monitoring Suppl (ONE TOUCH ULTRA MINI) w/Device KIT 3 (three) times daily. for testing  0  . budesonide-formoterol (SYMBICORT) 160-4.5 MCG/ACT inhaler Inhale 2  puffs into the lungs 2 (two) times daily. 2 Inhaler 0  . Calcium & Magnesium Carbonates (MYLANTA PO) Take 1 tablet by mouth daily as needed (for indigestion).     . Cholecalciferol (VITAMIN D) 2000 UNITS tablet Take 2,000 Units by mouth daily.     . Cyanocobalamin (VITAMIN B-12 IJ) Inject as directed every 30 (thirty) days.    . cyclobenzaprine (FLEXERIL) 5 MG tablet TAKE 1 TABLET BY MOUTH AT BEDTIME 30 tablet 5  . diclofenac sodium (VOLTAREN) 1 % GEL Apply 2 g topically every 4 (four) hours.    Marland Kitchen diltiazem (CARDIZEM CD) 240 MG 24 hr capsule Take 1 capsule (240 mg total) by mouth daily. 30 capsule 10  . Evolocumab (REPATHA SURECLICK) 876 MG/ML SOAJ Inject 1 pen into the skin every 14 (fourteen) days. 2 pen 11  . fexofenadine (ALLEGRA) 180 MG tablet Take 1 tablet (180 mg total) by mouth daily. 30 tablet 6  . furosemide (LASIX) 40 MG tablet Take 80 mg by mouth every other day.     . insulin lispro protamine-insulin lispro (HUMALOG 75/25) (75-25) 100 UNIT/ML SUSP Inject 20-30 Units into the skin 2 (two) times daily with a meal. Takes 20 units in the morning and 30  units with supper    . loperamide (IMODIUM) 2 MG capsule Take 2 mg by mouth as needed for diarrhea or loose stools.     . meclizine (ANTIVERT) 25 MG tablet Take 25 mg by mouth 2 (two) times daily as needed for dizziness.     . methocarbamol (ROBAXIN) 500 MG tablet Take 500 mg by mouth every 8 (eight) hours as needed for muscle spasms.  1  . metoprolol succinate (TOPROL-XL) 50 MG 24 hr tablet Take 1 tablet (50 mg total) by mouth daily. 30 tablet 10  . montelukast (SINGULAIR) 5 MG chewable tablet Chew 1 tablet (5 mg total) by mouth at bedtime. 30 tablet 3  . Multiple Vitamin (MULTIVITAMIN WITH MINERALS) TABS Take 1 tablet by mouth daily.    . Nebulizers (COMPRESSOR/NEBULIZER) MISC Use with albuterol 1 each 0  . nitroGLYCERIN (NITROSTAT) 0.4 MG SL tablet Place 1 tablet (0.4 mg total) under the tongue every 5 (five) minutes x 3 doses as needed  for chest pain. 25 tablet 6  . omeprazole (PRILOSEC) 40 MG capsule TAKE 1 CAPSULE BY MOUTH TWICE A DAY 60 capsule 4  . ONE TOUCH ULTRA TEST test strip CHECK BLOOD SUGAR TWICE A DAY DX: E11.9  3  . ONETOUCH DELICA LANCETS 81L MISC 3 (three) times daily. for testing  0  . Pancrelipase, Lip-Prot-Amyl, 24000 units CPEP Take 2 capsules (48,000 Units total) by mouth 3 (three) times daily with meals. Take 1 capsule with a snack 210 capsule 6  . potassium chloride SA (K-DUR,KLOR-CON) 20 MEQ tablet Take 20 mEq by mouth 2 (two) times daily.    Marland Kitchen Spacer/Aero-Holding Chambers (AEROCHAMBER MV) inhaler Use as  instructed 1 each 0  . isosorbide mononitrate (IMDUR) 60 MG 24 hr tablet Take 1 tablet (60 mg total) by mouth daily. 30 tablet 11   Current Facility-Administered Medications on File Prior to Visit  Medication Dose Route Frequency Provider Last Rate Last Dose  . cyanocobalamin ((VITAMIN B-12)) injection 1,000 mcg  1,000 mcg Intramuscular Q30 days Binnie Rail, MD   1,000 mcg at 03/26/16 1658    Past Medical History:  Diagnosis Date  . Acute myocardial infarction, unspecified site, episode of care unspecified   . Adenomatous colon polyp   . Anxiety   . Chest pain   . Coronary artery disease   . Diabetes mellitus   . GERD (gastroesophageal reflux disease)   . Hiatal hernia   . Hyperlipidemia   . Hypertension   . PONV (postoperative nausea and vomiting)   . Schatzki's ring   . Shoulder injury    resolved after shoulder surgery  . Unspecified asthma(493.90)   . Upper respiratory symptom    02-13-14 "runny nose, mild cough,sniffles" -instructed to inform MD if symptoms worsens    Past Surgical History:  Procedure Laterality Date  . ABDOMINAL HYSTERECTOMY    . APPENDECTOMY     came out with Hysterectomy  . CARDIAC CATHETERIZATION  07/23/2009   EF 60%  . CARDIAC CATHETERIZATION  10/11/2008  . CARDIAC CATHETERIZATION  03/01/2007   EF 75-80%  . CARDIOVASCULAR STRESS TEST  11/15/2007   EF 60%    . COLONOSCOPY    . CORONARY ARTERY BYPASS GRAFT     SEVERELY DISEASED SAPHENOUS VEIN GRAFT TO THE RIGHT CORONARY ARTERY BUT WITH FAIRLY WELL PRESERVED FLOW TO THE DISTAL RIGHT CORONARY ARTERY FROM THE NATIVE CIRCULATION-RESTART  CATH IN JUNE 2000, REVEALS MILD/MODERATE  CAD WITH GOOD FLOW DOWN HER LAD  . ESOPHAGEAL DILATION    . ESOPHAGOGASTRODUODENOSCOPY    . EYE SURGERY     bilateral cataract surgery with lens implant  . lense removal Left   . ROTATOR CUFF REPAIR     right and left  . TONSILLECTOMY     age 89  . 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 tumor removed kidney    . US ECHOCARDIOGRAPHY  03/08/2008   EF 55-60%    Social History   Social History  . Marital status: Widowed    Spouse name: N/A  . Number of children: 4  . Years of education: Doctorate   Occupational History  . Retired    Social History Main Topics  . Smoking status: Former Smoker    Packs/day: 0.50    Years: 21.00    Types: Cigarettes    Start date: 02/09/1955    Quit date: 02/09/1976  . Smokeless tobacco: Never Used  . Alcohol use No  . Drug use: No  . Sexual activity: Not Asked   Other Topics Concern  . None   Social History Narrative   Lives alone.   Caffeine use: Drinks 1 cup coffee/day      Originally from [place]. Previously has lived in [place]. Prior travel to [place], [place], [place], [place], [place], [place], [place], & [place]. Previously worked in [employer] She has a dog currently. No bird, mold, or hot tub exposure. She also pastors a church.     Family History  Problem Relation Age of Onset  . Heart disease Maternal  Grandfather   . Heart failure Maternal Grandfather   . Diabetes Maternal Grandfather   . Heart attack Father   . Stomach cancer Father   . Diabetes Mother   . Rheum arthritis Sister   . Emphysema Paternal Uncle   . Esophageal  cancer Brother 34       she said he was born with it  . Emphysema Paternal Aunt   . Neuropathy Neg Hx   . Multiple sclerosis Neg Hx   . Colon cancer Neg Hx     Review of Systems  Constitutional: Positive for fatigue. Negative for chills and fever.  Respiratory: Positive for cough, shortness of breath and wheezing.   Cardiovascular: Positive for chest pain (cardiology aware) and leg swelling. Negative for palpitations.  Gastrointestinal: Negative for abdominal pain and nausea.  Neurological: Positive for light-headedness and headaches.  Psychiatric/Behavioral: Positive for sleep disturbance.       Objective:   Vitals:   11/23/16 1112  BP: (!) 144/70  Pulse: (!) 102  Resp: 16  Temp: 98.6 F (37 C)   Wt Readings from Last 3 Encounters:  11/23/16 204 lb (92.5 kg)  10/28/16 204 lb (92.5 kg)  09/22/16 207 lb 6.4 oz (94.1 kg)   Body mass index is 37.31 kg/m.   Physical Exam    Constitutional: Appears well-developed and well-nourished. No distress.  HENT:  Head: Normocephalic and atraumatic.  Neck: Neck supple. No tracheal deviation present. No thyromegaly present.  No cervical lymphadenopathy Cardiovascular: Normal rate, regular rhythm and normal heart sounds.   No murmur heard. No carotid bruit .  No edema Pulmonary/Chest: Effort normal and breath sounds normal. No respiratory distress. No has no wheezes. No rales.  Skin: Skin is warm and dry. Not diaphoretic.  Psychiatric: Normal mood and affect. Behavior is normal.      Assessment & Plan:    See Problem List for Assessment and Plan of chronic medical problems.

## 2016-11-23 ENCOUNTER — Ambulatory Visit (INDEPENDENT_AMBULATORY_CARE_PROVIDER_SITE_OTHER): Payer: Medicare Other | Admitting: Internal Medicine

## 2016-11-23 ENCOUNTER — Encounter: Payer: Self-pay | Admitting: Internal Medicine

## 2016-11-23 ENCOUNTER — Encounter: Payer: Self-pay | Admitting: Gastroenterology

## 2016-11-23 VITALS — BP 144/70 | HR 102 | Temp 98.6°F | Resp 16 | Wt 204.0 lb

## 2016-11-23 DIAGNOSIS — R51 Headache: Secondary | ICD-10-CM

## 2016-11-23 DIAGNOSIS — R252 Cramp and spasm: Secondary | ICD-10-CM

## 2016-11-23 DIAGNOSIS — E109 Type 1 diabetes mellitus without complications: Secondary | ICD-10-CM

## 2016-11-23 DIAGNOSIS — Z23 Encounter for immunization: Secondary | ICD-10-CM

## 2016-11-23 DIAGNOSIS — E538 Deficiency of other specified B group vitamins: Secondary | ICD-10-CM | POA: Diagnosis not present

## 2016-11-23 DIAGNOSIS — G4709 Other insomnia: Secondary | ICD-10-CM

## 2016-11-23 DIAGNOSIS — G4700 Insomnia, unspecified: Secondary | ICD-10-CM | POA: Insufficient documentation

## 2016-11-23 DIAGNOSIS — K219 Gastro-esophageal reflux disease without esophagitis: Secondary | ICD-10-CM

## 2016-11-23 DIAGNOSIS — I10 Essential (primary) hypertension: Secondary | ICD-10-CM | POA: Diagnosis not present

## 2016-11-23 DIAGNOSIS — R519 Headache, unspecified: Secondary | ICD-10-CM

## 2016-11-23 DIAGNOSIS — G8929 Other chronic pain: Secondary | ICD-10-CM | POA: Insufficient documentation

## 2016-11-23 DIAGNOSIS — E785 Hyperlipidemia, unspecified: Secondary | ICD-10-CM

## 2016-11-23 MED ORDER — GABAPENTIN 100 MG PO CAPS: ORAL_CAPSULE | ORAL | 3 refills | Status: DC

## 2016-11-23 MED ORDER — CYANOCOBALAMIN 1000 MCG/ML IJ SOLN
1000.0000 ug | Freq: Once | INTRAMUSCULAR | Status: AC
  Administered 2016-11-23: 1000 ug via INTRAMUSCULAR

## 2016-11-23 MED ORDER — TRAZODONE HCL 50 MG PO TABS: 50.0000 mg | ORAL_TABLET | Freq: Every evening | ORAL | 3 refills | Status: DC | PRN

## 2016-11-23 NOTE — Assessment & Plan Note (Signed)
Fairly controlled Continue current medications at current doses

## 2016-11-23 NOTE — Assessment & Plan Note (Signed)
B12 injection today and continue monthly.  

## 2016-11-23 NOTE — Assessment & Plan Note (Signed)
Taking flexeril every evening

## 2016-11-23 NOTE — Assessment & Plan Note (Signed)
Management per endocrine Sugars have been fairly controlled

## 2016-11-23 NOTE — Assessment & Plan Note (Signed)
Currently taking repatha

## 2016-11-23 NOTE — Assessment & Plan Note (Signed)
Chronic headaches, Intractable Pain starts at the base of skull on the right side and possibly  hemicrania neurology Trial of gabapentin 300 mg at night, which may also help with her sleep We'll refer to [place] neurology

## 2016-11-23 NOTE — Assessment & Plan Note (Addendum)
Not sleeping well -  Chronic - getting 4 hrs if it is a good night Wakes frequently She is having chronic headaches and we will try gabapentin 300 mg at night and hopefully this will help her sleep as well-may need to be titrated up Will try trazodone for sleep ifGabapentin is not helpful

## 2016-11-23 NOTE — Patient Instructions (Addendum)
  All other Health Maintenance issues reviewed.   All recommended immunizations and age-appropriate screenings are up-to-date or discussed.  Flu immunization administered today. A B12 injection was given.   Medications reviewed and updated.  Changes include trying gabapentin at night for your headaches and sleep.  Do not take the trazodone for right now.   Your prescription(s) have been submitted to your pharmacy. Please take as directed and contact our office if you believe you are having problem(s) with the medication(s).  A referral was ordered for neurology  Please followup in 2 weeks

## 2016-11-23 NOTE — Assessment & Plan Note (Signed)
GERD controlled Continue daily medication  

## 2016-11-24 ENCOUNTER — Encounter: Payer: Self-pay | Admitting: Neurology

## 2016-11-30 ENCOUNTER — Ambulatory Visit: Payer: Medicare Other | Admitting: Physical Therapy

## 2016-11-30 ENCOUNTER — Encounter: Payer: Self-pay | Admitting: Physical Therapy

## 2016-11-30 DIAGNOSIS — M542 Cervicalgia: Secondary | ICD-10-CM

## 2016-11-30 DIAGNOSIS — M6281 Muscle weakness (generalized): Secondary | ICD-10-CM

## 2016-11-30 DIAGNOSIS — R293 Abnormal posture: Secondary | ICD-10-CM

## 2016-11-30 NOTE — Therapy (Signed)
[place], [place], [address] Phone: [phone]   Fax:  [phone]  Physical Therapy Treatment/Discharge Summary  Patient Details  Name: ALEYNA CUEVA MRN: [id number] Date of Birth: [birth year] Referring Provider: Sarina Ill  Encounter Date: 11/30/2016      PT End of Session - 11/30/16 1017    Visit Number 6   Number of Visits 12   Date for PT Re-Evaluation 11/30/16   Authorization Type [employer] Medicare   Authorization Time Period 11/30/16   PT Start Time 1018   PT Stop Time 1053   PT Time Calculation (min) 35 min   Activity Tolerance Patient tolerated treatment well   Behavior During Therapy Anxious      Past Medical History:  Diagnosis Date  . Acute myocardial infarction, unspecified site, episode of care unspecified   . Adenomatous colon polyp   . Anxiety   . Chest pain   . Coronary artery disease   . Diabetes mellitus   . GERD (gastroesophageal reflux disease)   . Hiatal hernia   . Hyperlipidemia   . Hypertension   . PONV (postoperative nausea and vomiting)   . Schatzki's ring   . Shoulder injury    resolved after shoulder surgery  . Unspecified asthma(493.90)   . Upper respiratory symptom    02-13-14 "runny nose, mild cough,sniffles" -instructed to inform MD if symptoms worsens    Past Surgical History:  Procedure Laterality Date  . ABDOMINAL HYSTERECTOMY    . APPENDECTOMY     came out with Hysterectomy  . CARDIAC CATHETERIZATION  07/23/2009   EF 60%  . CARDIAC CATHETERIZATION  10/11/2008  . CARDIAC CATHETERIZATION  03/01/2007   EF 75-80%  . CARDIOVASCULAR STRESS TEST  11/15/2007   EF 60%  . COLONOSCOPY    . CORONARY ARTERY BYPASS GRAFT     SEVERELY DISEASED SAPHENOUS VEIN GRAFT TO THE RIGHT CORONARY ARTERY BUT WITH FAIRLY WELL PRESERVED FLOW TO THE DISTAL RIGHT CORONARY ARTERY FROM THE NATIVE CIRCULATION-RESTART  CATH IN JUNE 2000, REVEALS MILD/MODERATE  CAD WITH GOOD FLOW DOWN  HER LAD  . ESOPHAGEAL DILATION    . ESOPHAGOGASTRODUODENOSCOPY    . EYE SURGERY     bilateral cataract surgery with lens implant  . lense removal Left   . ROTATOR CUFF REPAIR     right and left  . TONSILLECTOMY     age 81  . 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 tumor removed kidney    . US ECHOCARDIOGRAPHY  03/08/2008   EF 55-60%    There were no vitals filed for this visit.      Subjective Assessment - 11/30/16 1019    Subjective I think today is my last day. Today is a bad HA day, I think they are looking at the wrong thing. The best thing is the TENS unit.    Patient Stated Goals I want the headaches to go away and my neck pain to go away   Currently in Pain? Yes   Pain Score 10-Worst pain ever            [facility] PT Assessment - 11/30/16 0001      Observation/Other Assessments   Focus on Therapeutic Outcomes (FOTO)  56% limitation  PT Education - 2016-12-08 1105    Education provided Yes   Education Details pain science, pain journal to help her and [place] patterns of pain, stress, discussion of goals, POC   Person(s) Educated Patient   Methods Explanation   Comprehension Verbalized understanding             PT Long Term Goals - 12-08-2016 1036      PT LONG TERM GOAL #1   Title "Demonstrate and verbalize techniques to reduce the risk of re-injury including: lifting, posture, body mechanics.    Baseline pt reports I don't do much lifting, Has not been thinking about it for light objects   Status Not Met     PT LONG TERM GOAL #2   Title "Pt will be independent with advanced HEP.    Baseline reports she is doing her exercises, they feel good   Status Achieved     PT LONG TERM GOAL #3   Title "Improved cervical rotation to at least 45 degrees bil. 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                Plan - 2016/12/08 1106    Clinical Impression Statement Pt has not met her goals in PT and continues to complain of 10/10 pain. Pt was agreeable that PT will end at this time to return to [place]. We had an extensive discussion today regarding pain science and increased sensitivity with time & stress. I asked her to keep a pain journal to find patterns/areas of most pain and she agreed. At this time she will return to [place] for further evaluation and was encouraged to call/return as needed.    PT Treatment/Interventions Taping;Dry needling;Passive range of motion;Manual techniques;Therapeutic activities;Therapeutic exercise;Neuromuscular re-education;Patient/family education;Ultrasound;Moist Heat;Traction;Iontophoresis 40m/ml Dexamethasone;Electrical Stimulation;Cryotherapy   PT Home Exercise Plan neck retraction Upper trap and levator stretch.  infor on TDN, tennis ball self myofascial, sub scapular. strength red t band, self SNAGS with towel TENS, standing scapular stabilizers   Consulted and Agree with Plan of Care Patient      Patient will benefit from skilled therapeutic intervention in order to improve the following deficits and impairments:  Pain, Impaired UE functional use, Improper body mechanics, Postural dysfunction, Increased muscle spasms, Decreased strength, Decreased range of motion  Visit Diagnosis: Cervicalgia  Abnormal posture  Muscle weakness  (generalized)       G-Codes - [id number]    Functional Assessment Tool Used (Outpatient Only) FOTO 56% limitation   Functional Limitation Carrying, moving and handling objects   Carrying, Moving and Handling Objects Goal Status (([id number] At least 40 percent but less than 60 percent impaired, limited or restricted   Carrying, Moving and Handling Objects Discharge Status [phone] At least 40 percent but less than 60 percent impaired, limited or restricted  Problem List Patient Active Problem List   Diagnosis Date Noted  . Insomnia 11/23/2016  . Chronic nonintractable headache 11/23/2016  . Pancreatic insufficiency 10/28/2016  . Carotid artery disease ([place]) 05/19/2016  . Fatigue 05/19/2016  . Anemia 03/27/2016  . Severe persistent asthma 03/18/2016  . Type 1 diabetes mellitus ([place]) 07/21/2015  . B12 deficiency 03/31/2015  . OSA (obstructive sleep apnea) 11/20/2014  . Fecal incontinence 08/28/2014  . 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 Falls 08/28/2014  . Neck pain of over 3 months duration 08/28/2014  . H/O total knee replacement 02/20/2014  . Obesity (BMI 30-39.9) 11/12/2013  . Chronic diastolic CHF (congestive heart failure) ([place]) 11/17/2012  . Meniscus, lateral, anterior horn derangement 11/10/2011  . Medial meniscus, posterior horn derangement 11/10/2011  . Osteoarthritis of right knee 11/10/2011  . Vertigo 10/05/2011  . HTN (hypertension), benign 10/05/2011  . Coronary artery disease 08/27/2010  . MUSCLE CRAMPS 12/29/2009  . HEART ATTACK 11/19/2009  . Extrinsic asthma 11/19/2009  . GERD 11/19/2009  . Hyperlipidemia 11/18/2009   PHYSICAL THERAPY DISCHARGE SUMMARY  Visits from Start of Care: 6  Current functional level related to goals / functional outcomes: See above   Remaining deficits: See above   Education / Equipment: Anatomy of condition, POC, HEP, exercise form/rationale  Plan: Patient agrees to discharge.  Patient goals were not met.  Patient is being discharged due to lack of progress.  ?????    Marita Burnsed C. Midori Dado PT, DPT 11/30/16 11:48 AM   [facility] [address] [place], [place], [address] Phone: [phone]   Fax:  [phone]  Name: GAYLON MELCHOR MRN: [id number] Date of Birth: [birth year]

## 2016-12-07 ENCOUNTER — Ambulatory Visit: Payer: Medicare Other | Admitting: Internal Medicine

## 2016-12-07 ENCOUNTER — Telehealth: Payer: Self-pay | Admitting: Gastroenterology

## 2016-12-07 ENCOUNTER — Encounter: Payer: Medicare Other | Admitting: Gastroenterology

## 2016-12-07 NOTE — Progress Notes (Deleted)
Subjective:    Patient ID: [name], female    DOB: 05/15/1940, 76 y.o.   MRN: [id number]  HPI The patient is here for follow up.  Chronic headaches:  We started her on gabapentin two weeks ago.  The pain starts at the base of the skull and is on the right side of the head.  She is doing PT.  She is taking the gabapentin at night.    Difficulty sleeping:  She is taking the gabapentin at night.  We disucssed possible trazodone if this did not help.     Medications and allergies reviewed with patient and updated if appropriate.  Patient Active Problem List   Diagnosis Date Noted  . Insomnia 11/23/2016  . Chronic nonintractable headache 11/23/2016  . Pancreatic insufficiency 10/28/2016  . Carotid artery disease ([place]) 05/19/2016  . Fatigue 05/19/2016  . Anemia 03/27/2016  . Severe persistent asthma 03/18/2016  . Type 1 diabetes mellitus ([place]) 07/21/2015  . B12 deficiency 03/31/2015  . OSA (obstructive sleep apnea) 11/20/2014  . Fecal incontinence 08/28/2014  . 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 Falls 08/28/2014  . Neck pain of over 3 months duration 08/28/2014  . H/O total knee replacement 02/20/2014  . Obesity (BMI 30-39.9) 11/12/2013  . Chronic diastolic CHF (congestive heart failure) ([place]) 11/17/2012  . Meniscus, lateral, anterior horn derangement 11/10/2011  . Medial meniscus, posterior horn derangement 11/10/2011  . Osteoarthritis of right knee 11/10/2011  . Vertigo 10/05/2011  . HTN (hypertension), benign 10/05/2011  . Coronary artery disease 08/27/2010  . MUSCLE CRAMPS 12/29/2009  . HEART ATTACK 11/19/2009  . Extrinsic asthma 11/19/2009  . GERD 11/19/2009  . Hyperlipidemia 11/18/2009    Current Outpatient Prescriptions on File Prior to Visit  Medication Sig Dispense Refill  . albuterol (PROVENTIL HFA;VENTOLIN HFA) 108 (90 BASE) MCG/ACT inhaler Inhale 2 puffs into the lungs every 6 (six) hours as needed. For shortness of breath. 1 Inhaler 0  .  albuterol (PROVENTIL) (2.5 MG/3ML) 0.083% nebulizer solution Take 3 mLs (2.5 mg total) by nebulization every 6 (six) hours as needed for wheezing or shortness of breath. 360 mL 3  . ALPRAZolam (XANAX) 0.5 MG tablet Take 0.5 mg by mouth 2 (two) times daily as needed for anxiety.     . benzonatate (TESSALON) 100 MG capsule Take 100 mg by mouth 3 (three) times daily as needed for cough.    . Blood Glucose Monitoring Suppl (ONE TOUCH ULTRA MINI) w/Device KIT 3 (three) times daily. for testing  0  . budesonide-formoterol (SYMBICORT) 160-4.5 MCG/ACT inhaler Inhale 2 puffs into the lungs 2 (two) times daily. 2 Inhaler 0  . Calcium & Magnesium Carbonates (MYLANTA PO) Take 1 tablet by mouth daily as needed (for indigestion).     . Cholecalciferol (VITAMIN D) 2000 UNITS tablet Take 2,000 Units by mouth daily.     . cyclobenzaprine (FLEXERIL) 5 MG tablet TAKE 1 TABLET BY MOUTH AT BEDTIME 30 tablet 5  . diclofenac sodium (VOLTAREN) 1 % GEL Apply 2 g topically every 4 (four) hours.    Marland Kitchen diltiazem (CARDIZEM CD) 240 MG 24 hr capsule Take 1 capsule (240 mg total) by mouth daily. 30 capsule 10  . Evolocumab (REPATHA SURECLICK) 440 MG/ML SOAJ Inject 1 pen into the skin every 14 (fourteen) days. 2 pen 11  . fexofenadine (ALLEGRA) 180 MG tablet Take 1 tablet (180 mg total) by mouth daily. 30 tablet 6  . furosemide (LASIX) 40 MG tablet Take 80 mg by mouth  every other day.     . gabapentin (NEURONTIN) 100 MG capsule Take 300 mg at night 90 capsule 3  . insulin lispro protamine-insulin lispro (HUMALOG 75/25) (75-25) 100 UNIT/ML SUSP Inject 20-30 Units into the skin 2 (two) times daily with a meal. Takes 20 units in the morning and 30  units with supper    . isosorbide mononitrate (IMDUR) 60 MG 24 hr tablet Take 1 tablet (60 mg total) by mouth daily. 30 tablet 11  . loperamide (IMODIUM) 2 MG capsule Take 2 mg by mouth as needed for diarrhea or loose stools.     . meclizine (ANTIVERT) 25 MG tablet Take 25 mg by mouth 2  (two) times daily as needed for dizziness.     . metoprolol succinate (TOPROL-XL) 50 MG 24 hr tablet Take 1 tablet (50 mg total) by mouth daily. 30 tablet 10  . montelukast (SINGULAIR) 5 MG chewable tablet Chew 1 tablet (5 mg total) by mouth at bedtime. 30 tablet 3  . Multiple Vitamin (MULTIVITAMIN WITH MINERALS) TABS Take 1 tablet by mouth daily.    . Nebulizers (COMPRESSOR/NEBULIZER) MISC Use with albuterol 1 each 0  . nitroGLYCERIN (NITROSTAT) 0.4 MG SL tablet Place 1 tablet (0.4 mg total) under the tongue every 5 (five) minutes x 3 doses as needed for chest pain. 25 tablet 6  . omeprazole (PRILOSEC) 40 MG capsule TAKE 1 CAPSULE BY MOUTH TWICE A DAY 60 capsule 4  . ONE TOUCH ULTRA TEST test strip CHECK BLOOD SUGAR TWICE A DAY DX: E11.9  3  . ONETOUCH DELICA LANCETS 27P MISC 3 (three) times daily. for testing  0  . Pancrelipase, Lip-Prot-Amyl, 24000 units CPEP Take 2 capsules (48,000 Units total) by mouth 3 (three) times daily with meals. Take 1 capsule with a snack 210 capsule 6  . potassium chloride SA (K-DUR,KLOR-CON) 20 MEQ tablet Take 20 mEq by mouth 2 (two) times daily.    Marland Kitchen Spacer/Aero-Holding Chambers (AEROCHAMBER MV) inhaler Use as instructed 1 each 0  . traZODone (DESYREL) 50 MG tablet Take 1-2 tablets (50-100 mg total) by mouth at bedtime as needed for sleep. 60 tablet 3   Current Facility-Administered Medications on File Prior to Visit  Medication Dose Route Frequency Provider Last Rate Last Dose  . cyanocobalamin ((VITAMIN B-12)) injection 1,000 mcg  1,000 mcg Intramuscular Q30 days Binnie Rail, MD   1,000 mcg at 03/26/16 1658    Past Medical History:  Diagnosis Date  . Acute myocardial infarction, unspecified site, episode of care unspecified   . Adenomatous colon polyp   . Anxiety   . Chest pain   . Coronary artery disease   . Diabetes mellitus   . GERD (gastroesophageal reflux disease)   . Hiatal hernia   . Hyperlipidemia   . Hypertension   . PONV (postoperative  nausea and vomiting)   . Schatzki's ring   . Shoulder injury    resolved after shoulder surgery  . Unspecified asthma(493.90)   . Upper respiratory symptom    02-13-14 "runny nose, mild cough,sniffles" -instructed to inform MD if symptoms worsens    Past Surgical History:  Procedure Laterality Date  . ABDOMINAL HYSTERECTOMY    . APPENDECTOMY     came out with Hysterectomy  . CARDIAC CATHETERIZATION  07/23/2009   EF 60%  . CARDIAC CATHETERIZATION  10/11/2008  . CARDIAC CATHETERIZATION  03/01/2007   EF 75-80%  . CARDIOVASCULAR STRESS TEST  11/15/2007   EF 60%  . COLONOSCOPY    .  CORONARY ARTERY BYPASS GRAFT     SEVERELY DISEASED SAPHENOUS VEIN GRAFT TO THE RIGHT CORONARY ARTERY BUT WITH FAIRLY WELL PRESERVED FLOW TO THE DISTAL RIGHT CORONARY ARTERY FROM THE NATIVE CIRCULATION-RESTART  CATH IN JUNE 2000, REVEALS MILD/MODERATE  CAD WITH GOOD FLOW DOWN HER LAD  . ESOPHAGEAL DILATION    . ESOPHAGOGASTRODUODENOSCOPY    . EYE SURGERY     bilateral cataract surgery with lens implant  . lense removal Left   . ROTATOR CUFF REPAIR     right and left  . TONSILLECTOMY     age 74  . 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 tumor removed kidney    . US ECHOCARDIOGRAPHY  03/08/2008   EF 55-60%    Social History   Social History  . Marital status: Widowed    Spouse name: N/A  . Number of children: 4  . Years of education: Doctorate   Occupational History  . Retired    Social History Main Topics  . Smoking status: Former Smoker    Packs/day: 0.50    Years: 21.00    Types: Cigarettes    Start date: 02/09/1955    Quit date: 02/09/1976  . Smokeless tobacco: Never Used  . Alcohol use No  . Drug use: No  . Sexual activity: Not on file   Other Topics Concern  . Not on file   Social History Narrative   Lives alone.   Caffeine use: Drinks 1 cup  coffee/day      Originally from [place]. Previously has lived in [place]. Prior travel to [place], [place], [place], [place], [place], [place], [place], & [place]. Previously worked in [employer] She has a dog currently. No bird, mold, or hot tub exposure. She also pastors a church.     Family History  Problem Relation Age of Onset  . Heart disease Maternal Grandfather   . Heart failure Maternal Grandfather   . Diabetes Maternal Grandfather   . Heart attack Father   . Stomach cancer Father   . Diabetes Mother   . Rheum arthritis Sister   . Emphysema Paternal Uncle   . Esophageal cancer Brother 37       she said he was born with it  . Emphysema Paternal Aunt   . Neuropathy Neg Hx   . Multiple sclerosis Neg Hx   . Colon cancer Neg Hx     Review of Systems     Objective:  There were no vitals filed for this visit. Wt Readings from Last 3 Encounters:  11/23/16 204 lb (92.5 kg)  10/28/16 204 lb (92.5 kg)  09/22/16 207 lb 6.4 oz (94.1 kg)   There is no height or weight on file to calculate BMI.   Physical Exam    Constitutional: Appears well-developed and well-nourished. No distress.  HENT:  Head: Normocephalic and atraumatic.  Neck: Neck supple. No tracheal deviation present. No thyromegaly present.  No cervical lymphadenopathy Cardiovascular: Normal rate, regular rhythm and normal heart sounds.   No murmur heard. No carotid bruit .  No edema Pulmonary/Chest: Effort normal and breath sounds normal. No respiratory distress. No has no wheezes. No rales.  Skin: Skin is warm and dry. Not diaphoretic.  Psychiatric: Normal mood and affect. Behavior is normal.      Assessment & Plan:    See Problem List for Assessment and  Plan of chronic medical problems.

## 2016-12-07 NOTE — Telephone Encounter (Signed)
I spoke with her assistant Tammy and she confirmed they do prescribe Plavix for the patient.

## 2016-12-07 NOTE — Telephone Encounter (Signed)
Discussed with Dr. Fuller Plan.  Patient recently seen in the office on 10/28/16.  There is no mention of Plavix in her chart.  Priscille Loveless, FNP prescribes the plavix for the patient.  Letter faxed to [phone] for permission to hold prior to EGD dil on 12/28/16

## 2016-12-10 ENCOUNTER — Encounter: Payer: Self-pay | Admitting: Physician Assistant

## 2016-12-10 ENCOUNTER — Ambulatory Visit (INDEPENDENT_AMBULATORY_CARE_PROVIDER_SITE_OTHER): Payer: Medicare Other | Admitting: Physician Assistant

## 2016-12-10 VITALS — BP 124/72 | HR 85 | Ht 62.0 in | Wt 208.4 lb

## 2016-12-10 DIAGNOSIS — I5032 Chronic diastolic (congestive) heart failure: Secondary | ICD-10-CM

## 2016-12-10 DIAGNOSIS — I25709 Atherosclerosis of coronary artery bypass graft(s), unspecified, with unspecified angina pectoris: Secondary | ICD-10-CM | POA: Diagnosis not present

## 2016-12-10 DIAGNOSIS — K219 Gastro-esophageal reflux disease without esophagitis: Secondary | ICD-10-CM

## 2016-12-10 DIAGNOSIS — E785 Hyperlipidemia, unspecified: Secondary | ICD-10-CM

## 2016-12-10 DIAGNOSIS — I10 Essential (primary) hypertension: Secondary | ICD-10-CM

## 2016-12-10 MED ORDER — CLOPIDOGREL BISULFATE 75 MG PO TABS: 75.0000 mg | ORAL_TABLET | Freq: Every day | ORAL | 3 refills | Status: AC

## 2016-12-10 MED ORDER — ASPIRIN EC 81 MG PO TBEC: 81.0000 mg | DELAYED_RELEASE_TABLET | Freq: Every day | ORAL | Status: DC

## 2016-12-10 MED ORDER — NITROGLYCERIN 0.4 MG SL SUBL: 0.4000 mg | SUBLINGUAL_TABLET | SUBLINGUAL | 3 refills | Status: DC | PRN

## 2016-12-10 MED ORDER — ISOSORBIDE MONONITRATE ER 60 MG PO TB24: 90.0000 mg | ORAL_TABLET | Freq: Every day | ORAL | 3 refills | Status: DC

## 2016-12-10 MED ORDER — PANTOPRAZOLE SODIUM 40 MG PO TBEC: 40.0000 mg | DELAYED_RELEASE_TABLET | Freq: Two times a day (BID) | ORAL | 3 refills | Status: DC

## 2016-12-10 NOTE — Addendum Note (Signed)
Addended by: Michae Kava on: 12/10/2016 02:27 PM   Modules accepted: Orders

## 2016-12-10 NOTE — Progress Notes (Signed)
Cardiology Office Note:    Date:  12/10/2016   ID:  [name], DOB 06/16/1940, MRN [id number]  PCP:  Burns, Stacy J, MD  Cardiologist:  Dr. Phil Nahser  Pulmonologist: Dr. Nestor Gastroenterologist: Dr. Stark  Referring MD: Burns, Stacy J, MD   Chief Complaint  Patient presents with  . Chest Pain    History of Present Illness:    [name] is a 76 y.o. female with a hx of coronary artery disease status post CABG, diastolic heart failure, diabetes, hyperlipidemia, hypertension, COPD.  Cardiac catheterization in the past has demonstrated 2 out of 3 grafts patent.  She was admitted in 03/2016 with lower GI bleed from diverticula.  Last seen by Dr. Nahser in August 2018.  [name] returns for the evaluation of chest pain.  Over the past couple of weeks, she has noted progressively worsening chest discomfort that seems to occur with some activities.  She has associated dyspnea and radiation up into her neck.  She can get symptoms with walking out to her mailbox.  She has taken nitroglycerin with relief.  She also notes fatigue afterwards.  She has had some edema but this is improved.  She does awaken at times short of breath with associated chest discomfort.  She denies syncope but has been lightheaded.  She has recently seen gastroenterology for dysphagia and was set up for an EGD.  Her current symptoms are not related to swallowing.  She was set up for the procedure but this was canceled because she was still on Plavix.  Of note, she has been taking aspirin 325 mg daily.  Prior CV studies:   The following studies were reviewed today:  Nuclear stress test 01/12/16 EF 62, normal perfusion, low risk  Echocardiogram 09/18/15 EF 60-65, normal wall motion, abnormal relaxation, moderate to severe MAC, mild mitral stenosis (mean gradient 4), PASP 42  Carotid US 09/08/15 Bilateral ICA 1-39  Echocardiogram 01/07/15 EF 60-65, normal wall motion, grade 1 diastolic dysfunction, moderate to  severe MAC, mild LAE, PASP 46, mildly reduced RV SF  Nuclear stress test 06/25/14 Normal perfusion, EF 55-65, low risk  Nuclear stress test 03/22/13 EF 68, no significant scar or ischemia, low risk  LHC 04/20/12:  Proximal LAD 50%, mid LAD 50-60% circumflex normal proximal RCA 40-50% SVG-RCA occluded LIMA-LAD patent SVG-D1 patent.  Medical therapy was recommended  Echo 09/2011:  EF 60-55%, normal wall motion, grade 1 diastolic dysfunction, severe MAC.   Nuclear study 01/2011:  EF 72%, small fixed inferobasal defect suggestive of thinning, no ischemia.   Past Medical History:  Diagnosis Date  . Acute myocardial infarction, unspecified site, episode of care unspecified   . Adenomatous colon polyp   . Anxiety   . Chest pain   . Coronary artery disease   . Diabetes mellitus   . GERD (gastroesophageal reflux disease)   . Hiatal hernia   . Hyperlipidemia   . Hypertension   . PONV (postoperative nausea and vomiting)   . Schatzki's ring   . Shoulder injury    resolved after shoulder surgery  . Unspecified asthma(493.90)   . Upper respiratory symptom    02-13-14 "runny nose, mild cough,sniffles" -instructed to inform MD if symptoms worsens    Past Surgical History:  Procedure Laterality Date  . ABDOMINAL HYSTERECTOMY    . APPENDECTOMY     came out with Hysterectomy  . CARDIAC CATHETERIZATION  07/23/2009   EF 60%  . CARDIAC CATHETERIZATION  10/11/2008  . CARDIAC   CATHETERIZATION  03/01/2007   EF 75-80%  . CARDIOVASCULAR STRESS TEST  11/15/2007   EF 60%  . COLONOSCOPY    . CORONARY ARTERY BYPASS GRAFT     SEVERELY DISEASED SAPHENOUS VEIN GRAFT TO THE RIGHT CORONARY ARTERY BUT WITH FAIRLY WELL PRESERVED FLOW TO THE DISTAL RIGHT CORONARY ARTERY FROM THE NATIVE CIRCULATION-RESTART  CATH IN JUNE 2000, REVEALS MILD/MODERATE  CAD WITH GOOD FLOW DOWN HER LAD  . ESOPHAGEAL DILATION    . ESOPHAGOGASTRODUODENOSCOPY    . EYE SURGERY     bilateral cataract surgery with lens implant  .  lense removal Left   . ROTATOR CUFF REPAIR     right and left  . TONSILLECTOMY     age 12  . TOTAL KNEE ARTHROPLASTY Right 02/20/2014   Procedure: RIGHT TOTAL KNEE ARTHROPLASTY;  Surgeon: Ronald A Gioffre, MD;  Location: [facility];  Service: Orthopedics;  Laterality: Right;  . tumor removed kidney    . US ECHOCARDIOGRAPHY  03/08/2008   EF 55-60%    Current Medications: Current Meds  Medication Sig  . albuterol (PROVENTIL HFA;VENTOLIN HFA) 108 (90 BASE) MCG/ACT inhaler Inhale 2 puffs into the lungs every 6 (six) hours as needed. For shortness of breath.  . albuterol (PROVENTIL) (2.5 MG/3ML) 0.083% nebulizer solution Take 3 mLs (2.5 mg total) by nebulization every 6 (six) hours as needed for wheezing or shortness of breath.  . ALPRAZolam (XANAX) 0.5 MG tablet Take 0.5 mg by mouth 2 (two) times daily as needed for anxiety.   . benzonatate (TESSALON) 100 MG capsule Take 100 mg by mouth 2 (two) times daily as needed for cough.   . Blood Glucose Monitoring Suppl (ONE TOUCH ULTRA MINI) w/Device KIT 3 (three) times daily. for testing  . budesonide-formoterol (SYMBICORT) 160-4.5 MCG/ACT inhaler Inhale 2 puffs into the lungs 2 (two) times daily.  . Calcium & Magnesium Carbonates (MYLANTA PO) Take 1 tablet by mouth daily as needed (for indigestion).   . Cholecalciferol (VITAMIN D) 2000 UNITS tablet Take 2,000 Units by mouth daily.   . cyclobenzaprine (FLEXERIL) 5 MG tablet TAKE 1 TABLET BY MOUTH AT BEDTIME  . diclofenac sodium (VOLTAREN) 1 % GEL Apply 2 g topically every 4 (four) hours.  . diltiazem (CARDIZEM CD) 240 MG 24 hr capsule Take 1 capsule (240 mg total) by mouth daily.  . Evolocumab (REPATHA SURECLICK) 140 MG/ML SOAJ Inject 1 pen into the skin every 14 (fourteen) days.  . fexofenadine (ALLEGRA) 180 MG tablet Take 1 tablet (180 mg total) by mouth daily.  . furosemide (LASIX) 40 MG tablet Take 80 mg by mouth every other day.   . gabapentin (NEURONTIN) 100 MG capsule Take 300 mg at night  .  insulin lispro protamine-insulin lispro (HUMALOG 75/25) (75-25) 100 UNIT/ML SUSP Inject 20-30 Units into the skin 2 (two) times daily with a meal. Takes 20 units in the morning and 30  units with supper  . loperamide (IMODIUM) 2 MG capsule Take 2 mg by mouth as needed for diarrhea or loose stools.   . meclizine (ANTIVERT) 25 MG tablet Take 25 mg by mouth 2 (two) times daily as needed for dizziness.   . metoprolol succinate (TOPROL-XL) 50 MG 24 hr tablet Take 1 tablet (50 mg total) by mouth daily.  . montelukast (SINGULAIR) 5 MG chewable tablet Chew 1 tablet (5 mg total) by mouth at bedtime.  . Multiple Vitamin (MULTIVITAMIN WITH MINERALS) TABS Take 1 tablet by mouth daily.  . Nebulizers (COMPRESSOR/NEBULIZER) MISC Use with albuterol  .   ONE TOUCH ULTRA TEST test strip CHECK BLOOD SUGAR TWICE A DAY DX: E11.9  . ONETOUCH DELICA LANCETS 33G MISC 3 (three) times daily. for testing  . Pancrelipase, Lip-Prot-Amyl, 24000 units CPEP Take 2 capsules (48,000 Units total) by mouth 3 (three) times daily with meals. Take 1 capsule with a snack  . potassium chloride SA (K-DUR,KLOR-CON) 20 MEQ tablet Take 20 mEq by mouth 2 (two) times daily.  . Spacer/Aero-Holding Chambers (AEROCHAMBER MV) inhaler Use as instructed  . traZODone (DESYREL) 50 MG tablet Take 1-2 tablets (50-100 mg total) by mouth at bedtime as needed for sleep.  . [DISCONTINUED] aspirin 325 MG tablet Take 325 mg by mouth daily.  . [DISCONTINUED] nitroGLYCERIN (NITROSTAT) 0.4 MG SL tablet Place 1 tablet (0.4 mg total) under the tongue every 5 (five) minutes x 3 doses as needed for chest pain.  . [DISCONTINUED] omeprazole (PRILOSEC) 40 MG capsule TAKE 1 CAPSULE BY MOUTH TWICE A DAY   Current Facility-Administered Medications for the 12/10/16 encounter (Office Visit) with Laurens Matheny T, PA-C  Medication  . cyanocobalamin ((VITAMIN B-12)) injection 1,000 mcg     Allergies:   Amlodipine; Banana; Co q10 [coenzyme q10]; Codeine; Morphine; Statins; and  Sulfonamide derivatives   Social History  Substance Use Topics  . Smoking status: Former Smoker    Packs/day: 0.50    Years: 21.00    Types: Cigarettes    Start date: 02/09/1955    Quit date: 02/09/1976  . Smokeless tobacco: Never Used  . Alcohol use No     Family Hx: The patient's family history includes Diabetes in her maternal grandfather and mother; Emphysema in her paternal aunt and paternal uncle; Esophageal cancer (age of onset: 32) in her brother; Heart attack in her father; Heart disease in her maternal grandfather; Heart failure in her maternal grandfather; Rheum arthritis in her sister; Stomach cancer in her father. There is no history of Neuropathy, Multiple sclerosis, or Colon cancer.  ROS:   Please see the history of present illness.    Review of Systems  Constitution: Positive for malaise/fatigue.  Cardiovascular: Positive for chest pain and dyspnea on exertion.  Respiratory: Positive for shortness of breath and wheezing.   Neurological: Positive for dizziness and headaches.   All other systems reviewed and are negative.   EKGs/Labs/Other Test Reviewed:    EKG:  EKG is  ordered today.  The ekg ordered today demonstrates normal sinus rhythm, heart rate 80, QTC 512 ms, no change from prior tracings  Recent Labs: 05/19/2016: TSH 1.25 06/21/2016: Hemoglobin 10.9; NT-Pro BNP 128; Platelets 293 06/28/2016: Magnesium 2.0 09/21/2016: ALT 11; BUN 18; Creatinine, Ser 0.77; Potassium 4.2; Sodium 144   Recent Lipid Panel Lab Results  Component Value Date/Time   CHOL 190 09/21/2016 08:45 AM   TRIG 105 09/21/2016 08:45 AM   HDL 36 (L) 09/21/2016 08:45 AM   CHOLHDL 5.3 (H) 09/21/2016 08:45 AM   CHOLHDL 4.4 10/06/2011 05:00 AM   LDLCALC 133 (H) 09/21/2016 08:45 AM    Physical Exam:    VS:  BP 124/72   Pulse 85   Ht 5' 2" (1.575 m)   Wt 208 lb 6.4 oz (94.5 kg)   LMP  (LMP Unknown)   SpO2 98%   BMI 38.12 kg/m     Wt Readings from Last 3 Encounters:  12/10/16 208 lb  6.4 oz (94.5 kg)  11/23/16 204 lb (92.5 kg)  10/28/16 204 lb (92.5 kg)     Physical Exam  Constitutional: She is oriented   to person, place, and time. She appears well-developed and well-nourished. No distress.  HENT:  Head: Normocephalic and atraumatic.  Eyes: No scleral icterus.  Neck: No JVD present.  Cardiovascular: Normal rate and regular rhythm.   No murmur heard. Pulmonary/Chest: Effort normal. She has no rales.  Abdominal: Soft.  Musculoskeletal: She exhibits no edema.  Neurological: She is alert and oriented to person, place, and time.  Skin: Skin is warm and dry.    ASSESSMENT:    1. Coronary artery disease involving coronary bypass graft of native heart with angina pectoris (HCC)   2. Chronic diastolic CHF (congestive heart failure) (HCC)   3. HTN (hypertension), benign   4. Hyperlipidemia, unspecified hyperlipidemia type   5. Gastroesophageal reflux disease, esophagitis presence not specified    PLAN:    In order of problems listed above:  1.  Coronary artery disease involving coronary bypass graft of native heart with angina pectoris (HCC)  History of CABG.  Cardiac catheterization in March 2014 demonstrated 2 out of 3 grafts patent.  Nuclear stress test in December 2017 was low risk.  She presents today with complaints of chest discomfort with minimal activity that seems to be consistent with CCS class III angina.  The symptoms seem to be different than what she has had in the past that prompted her stress testing.  Lately, she feels that her symptoms are more reminiscent of her angina prior to her bypass.  She has been able to take nitroglycerin with relief.  She tries to limit this because it does cause a headache.  I have recommended proceeding with cardiac catheterization.  I have reviewed this with Dr. McAlhany (Dr. of the day) who agrees.  Risks and benefits of cardiac catheterization have been discussed with the patient.  These include bleeding, infection, kidney  damage, stroke, heart attack, death.  The patient understands these risks and is willing to proceed.   -Increase isosorbide to 90 mg daily  -Decrease aspirin 80 mg daily  -Continue Plavix 75 mg daily  -Continue evolocumab, beta-blocker  -Arrange cardiac catheterization next week.  Obtain labs today.  2.  Chronic diastolic CHF (congestive heart failure) (HCC)  Volume appears stable.  Continue current regimen.  3.  HTN (hypertension), benign - Plan: Basic Metabolic Panel (BMET) The patient's blood pressure is controlled on her current regimen.  Continue current therapy.   4.  Hyperlipidemia, unspecified hyperlipidemia type Continue statin.  5.  Gastroesophageal reflux disease, esophagitis presence not specified She was recently set up for EGD.  This will need to be placed on hold until after her cardiac catheterization.  If she does not require PCI, she may proceed with her EGD and hold Plavix if necessary.  If she requires PCI, she will likely need to remain on Plavix and aspirin without interruption for at least 6 months, ideally 12 months.  Since she does take Plavix, I have recommended that she stop taking omeprazole.  I have given her Protonix 40 mg twice daily.   Dispo:  Return in about 2 weeks (around 12/24/2016) for Post Procedure Follow Up, w/ Dr. Nahser.   Medication Adjustments/Labs and Tests Ordered: Current medicines are reviewed at length with the patient today.  Concerns regarding medicines are outlined above.  Tests Ordered: Orders Placed This Encounter  Procedures  . Basic Metabolic Panel (BMET)  . CBC  . INR/PT  . EKG 12-Lead   Medication Changes: Meds ordered this encounter  Medications  . clopidogrel (PLAVIX) 75 MG tablet      Sig: Take 1 tablet (75 mg total) by mouth daily.    Dispense:  90 tablet    Refill:  3  . aspirin EC 81 MG tablet    Sig: Take 1 tablet (81 mg total) by mouth daily.  . isosorbide mononitrate (IMDUR) 60 MG 24 hr tablet    Sig: Take 1.5  tablets (90 mg total) by mouth daily.    Dispense:  1353 tablet    Refill:  3    DOSE INCREASE  . nitroGLYCERIN (NITROSTAT) 0.4 MG SL tablet    Sig: Place 1 tablet (0.4 mg total) under the tongue every 5 (five) minutes as needed.    Dispense:  25 tablet    Refill:  3  . pantoprazole (PROTONIX) 40 MG tablet    Sig: Take 1 tablet (40 mg total) by mouth 2 (two) times daily before a meal.    Dispense:  180 tablet    Refill:  3    Signed, Kaily Wragg, PA-C  12/10/2016 1:56 PM    [facility] [address], [place], [place]  [address] Phone: [phone]; Fax: [phone]    

## 2016-12-10 NOTE — Patient Instructions (Addendum)
Medication Instructions:  1. STOP OMEPRAZOLE  2. START PROTONIX 40 MG TWICE DAILY; RX HAS BEEN SENT IN  3. DECREASE ASPIRIN TO 81 MG DAILY  4. INCREASE IMDUR TO 90 MG DAILY (THIS WILL BE 1 AND 1/2 TABS DAILY OF THE 60 MG TABLET)  Labwork: TODAY BMET, CBC, PT/INR  Testing/Procedures: Your physician has requested that you have a cardiac catheterization. Cardiac catheterization is used to diagnose and/or treat various heart conditions. Doctors may recommend this procedure for a number of different reasons. The most common reason is to evaluate chest pain. Chest pain can be a symptom of coronary artery disease (CAD), and cardiac catheterization can show whether plaque is narrowing or blocking your heart's arteries. This procedure is also used to evaluate the valves, as well as measure the blood flow and oxygen levels in different parts of your heart. For further information please visit [email or web address]. Please follow instruction sheet, as given. CATH DATE IS 12/14/16 @ 12 NOON.      Follow-Up: DR. Acie Fredrickson OR CARE TEAM 2 WEEKS POST CATH   Any Other Special Instructions Will Be Listed Below (If Applicable). YOU HAVE BEEN ADVISED TO GO TO THE ED IF YOUR CHEST PAIN WORSENS BEFORE YOUR CATH DATE   If you need a refill on your cardiac medications before your next appointment, please call your pharmacy.    [facility] OFF[facility] [address] [place] [address] Dept: [phone] Loc: [place]  12/10/2016  You are scheduled for a Cardiac Catheterization on Tuesday, November 6 with Dr. Larae Grooms @ 55 NOON.   1. Please arrive at the [facility] (Main Entrance A) at [facility]: [place], [place] [address] at 10:00 AM (two hours before your procedure to ensure your preparation). Free valet parking service is available.   Special note: Every effort is made  to have your procedure done on time. Please understand that emergencies sometimes delay scheduled procedures.  2. Diet: Do not eat or drink anything after midnight prior to your procedure except sips of water to take medications. YOU CAN CLEAR LIQUIDS UP UNTIL 6 AM ON 12/14/16  3. Labs: You will need to have blood drawn on Friday, November 2 at [facility] at [facility]. [address]  Open: 7:30am - 5pm    Phone: [phone]. You do not need to be fasting.  4. Medication instructions in preparation for your procedure:  4 A) HOLD YOUR INSULIN THE MORNING OF CATH 12/14/16  On the morning of your procedure, take your Aspirin and PLAVIX any morning medicines NOT listed above.  You may use SMALL SIPS of water.  5. Plan for one night stay--bring personal belongings. 6. Bring a current list of your medications and current insurance cards. 7. You MUST have a responsible person to drive you home. 8. Someone MUST be with you the first 24 hours after you arrive home or your discharge will be delayed. 9. Please wear clothes that are easy to get on and off and wear slip-on shoes.  Thank you for allowing [place] to care for you!   -- [facility]

## 2016-12-10 NOTE — H&P (View-Only) (Signed)
Cardiology Office Note:    Date:  12/10/2016   ID:  [name], DOB 26-Feb-1940, MRN [id number]  PCP:  Binnie Rail, MD  Cardiologist:  Dr. Liam Rogers  Pulmonologist: Dr. Ashok Cordia Gastroenterologist: Dr. Fuller Plan  Referring MD: Binnie Rail, MD   Chief Complaint  Patient presents with  . Chest Pain    History of Present Illness:    [name] is a 76 y.o. female with a hx of coronary artery disease status post CABG, diastolic heart failure, diabetes, hyperlipidemia, hypertension, COPD.  Cardiac catheterization in the past has demonstrated 2 out of 3 grafts patent.  She was admitted in 03/2016 with lower GI bleed from diverticula.  Last seen by Dr. Acie Fredrickson in August 2018.  [name] returns for the evaluation of chest pain.  Over the past couple of weeks, she has noted progressively worsening chest discomfort that seems to occur with some activities.  She has associated dyspnea and radiation up into her neck.  She can get symptoms with walking out to her mailbox.  She has taken nitroglycerin with relief.  She also notes fatigue afterwards.  She has had some edema but this is improved.  She does awaken at times short of breath with associated chest discomfort.  She denies syncope but has been lightheaded.  She has recently seen gastroenterology for dysphagia and was set up for an EGD.  Her current symptoms are not related to swallowing.  She was set up for the procedure but this was canceled because she was still on Plavix.  Of note, she has been taking aspirin 325 mg daily.  Prior CV studies:   The following studies were reviewed today:  Nuclear stress test 01/12/16 EF 62, normal perfusion, low risk  Echocardiogram 09/18/15 EF 60-65, normal wall motion, abnormal relaxation, moderate to severe MAC, mild mitral stenosis (mean gradient 4), PASP 42  Carotid US 09/08/15 Bilateral ICA 1-39  Echocardiogram 01/07/15 EF 60-65, normal wall motion, grade 1 diastolic dysfunction, moderate to  severe MAC, mild LAE, PASP 46, mildly reduced RV SF  Nuclear stress test 06/25/14 Normal perfusion, EF 55-65, low risk  Nuclear stress test 03/22/13 EF 68, no significant scar or ischemia, low risk  LHC 04/20/12:  Proximal LAD 50%, mid LAD 50-60% circumflex normal proximal RCA 40-50% SVG-RCA occluded LIMA-LAD patent SVG-D1 patent.  Medical therapy was recommended  Echo 09/2011:  EF 60-55%, normal wall motion, grade 1 diastolic dysfunction, severe MAC.   Nuclear study 01/2011:  EF 72%, small fixed inferobasal defect suggestive of thinning, no ischemia.   Past Medical History:  Diagnosis Date  . Acute myocardial infarction, unspecified site, episode of care unspecified   . Adenomatous colon polyp   . Anxiety   . Chest pain   . Coronary artery disease   . Diabetes mellitus   . GERD (gastroesophageal reflux disease)   . Hiatal hernia   . Hyperlipidemia   . Hypertension   . PONV (postoperative nausea and vomiting)   . Schatzki's ring   . Shoulder injury    resolved after shoulder surgery  . Unspecified asthma(493.90)   . Upper respiratory symptom    02-13-14 "runny nose, mild cough,sniffles" -instructed to inform MD if symptoms worsens    Past Surgical History:  Procedure Laterality Date  . ABDOMINAL HYSTERECTOMY    . APPENDECTOMY     came out with Hysterectomy  . CARDIAC CATHETERIZATION  07/23/2009   EF 60%  . CARDIAC CATHETERIZATION  10/11/2008  . CARDIAC  CATHETERIZATION  03/01/2007   EF 75-80%  . CARDIOVASCULAR STRESS TEST  11/15/2007   EF 60%  . COLONOSCOPY    . CORONARY ARTERY BYPASS GRAFT     SEVERELY DISEASED SAPHENOUS VEIN GRAFT TO THE RIGHT CORONARY ARTERY BUT WITH FAIRLY WELL PRESERVED FLOW TO THE DISTAL RIGHT CORONARY ARTERY FROM THE NATIVE CIRCULATION-RESTART  CATH IN JUNE 2000, REVEALS MILD/MODERATE  CAD WITH GOOD FLOW DOWN HER LAD  . ESOPHAGEAL DILATION    . ESOPHAGOGASTRODUODENOSCOPY    . EYE SURGERY     bilateral cataract surgery with lens implant  .  lense removal Left   . ROTATOR CUFF REPAIR     right and left  . TONSILLECTOMY     age 48  . 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 tumor removed kidney    . US ECHOCARDIOGRAPHY  03/08/2008   EF 55-60%    Current Medications: Current Meds  Medication Sig  . albuterol (PROVENTIL HFA;VENTOLIN HFA) 108 (90 BASE) MCG/ACT inhaler Inhale 2 puffs into the lungs every 6 (six) hours as needed. For shortness of breath.  Marland Kitchen albuterol (PROVENTIL) (2.5 MG/3ML) 0.083% nebulizer solution Take 3 mLs (2.5 mg total) by nebulization every 6 (six) hours as needed for wheezing or shortness of breath.  . ALPRAZolam (XANAX) 0.5 MG tablet Take 0.5 mg by mouth 2 (two) times daily as needed for anxiety.   . benzonatate (TESSALON) 100 MG capsule Take 100 mg by mouth 2 (two) times daily as needed for cough.   . Blood Glucose Monitoring Suppl (ONE TOUCH ULTRA MINI) w/Device KIT 3 (three) times daily. for testing  . budesonide-formoterol (SYMBICORT) 160-4.5 MCG/ACT inhaler Inhale 2 puffs into the lungs 2 (two) times daily.  . Calcium & Magnesium Carbonates (MYLANTA PO) Take 1 tablet by mouth daily as needed (for indigestion).   . Cholecalciferol (VITAMIN D) 2000 UNITS tablet Take 2,000 Units by mouth daily.   . cyclobenzaprine (FLEXERIL) 5 MG tablet TAKE 1 TABLET BY MOUTH AT BEDTIME  . diclofenac sodium (VOLTAREN) 1 % GEL Apply 2 g topically every 4 (four) hours.  Marland Kitchen diltiazem (CARDIZEM CD) 240 MG 24 hr capsule Take 1 capsule (240 mg total) by mouth daily.  . Evolocumab (REPATHA SURECLICK) 154 MG/ML SOAJ Inject 1 pen into the skin every 14 (fourteen) days.  . fexofenadine (ALLEGRA) 180 MG tablet Take 1 tablet (180 mg total) by mouth daily.  . furosemide (LASIX) 40 MG tablet Take 80 mg by mouth every other day.   . gabapentin (NEURONTIN) 100 MG capsule Take 300 mg at night  .  insulin lispro protamine-insulin lispro (HUMALOG 75/25) (75-25) 100 UNIT/ML SUSP Inject 20-30 Units into the skin 2 (two) times daily with a meal. Takes 20 units in the morning and 30  units with supper  . loperamide (IMODIUM) 2 MG capsule Take 2 mg by mouth as needed for diarrhea or loose stools.   . meclizine (ANTIVERT) 25 MG tablet Take 25 mg by mouth 2 (two) times daily as needed for dizziness.   . metoprolol succinate (TOPROL-XL) 50 MG 24 hr tablet Take 1 tablet (50 mg total) by mouth daily.  . montelukast (SINGULAIR) 5 MG chewable tablet Chew 1 tablet (5 mg total) by mouth at bedtime.  . Multiple Vitamin (MULTIVITAMIN WITH MINERALS) TABS Take 1 tablet by mouth daily.  . Nebulizers (COMPRESSOR/NEBULIZER) MISC Use with albuterol  .  ONE TOUCH ULTRA TEST test strip CHECK BLOOD SUGAR TWICE A DAY DX: E11.9  . ONETOUCH DELICA LANCETS 22L MISC 3 (three) times daily. for testing  . Pancrelipase, Lip-Prot-Amyl, 24000 units CPEP Take 2 capsules (48,000 Units total) by mouth 3 (three) times daily with meals. Take 1 capsule with a snack  . potassium chloride SA (K-DUR,KLOR-CON) 20 MEQ tablet Take 20 mEq by mouth 2 (two) times daily.  Marland Kitchen Spacer/Aero-Holding Chambers (AEROCHAMBER MV) inhaler Use as instructed  . traZODone (DESYREL) 50 MG tablet Take 1-2 tablets (50-100 mg total) by mouth at bedtime as needed for sleep.  . [DISCONTINUED] aspirin 325 MG tablet Take 325 mg by mouth daily.  . [DISCONTINUED] nitroGLYCERIN (NITROSTAT) 0.4 MG SL tablet Place 1 tablet (0.4 mg total) under the tongue every 5 (five) minutes x 3 doses as needed for chest pain.  . [DISCONTINUED] omeprazole (PRILOSEC) 40 MG capsule TAKE 1 CAPSULE BY MOUTH TWICE A DAY   Current Facility-Administered Medications for the 12/10/16 encounter (Office Visit) with Richardson Dopp T, PA-C  Medication  . cyanocobalamin ((VITAMIN B-12)) injection 1,000 mcg     Allergies:   Amlodipine; Banana; Co q10 [coenzyme q10]; Codeine; Morphine; Statins; and  Sulfonamide derivatives   Social History  Substance Use Topics  . Smoking status: Former Smoker    Packs/day: 0.50    Years: 21.00    Types: Cigarettes    Start date: 02/09/1955    Quit date: 02/09/1976  . Smokeless tobacco: Never Used  . Alcohol use No     Family Hx: The patient's family history includes Diabetes in her maternal grandfather and mother; Emphysema in her paternal aunt and paternal uncle; Esophageal cancer (age of onset: 33) in her brother; Heart attack in her father; Heart disease in her maternal grandfather; Heart failure in her maternal grandfather; Rheum arthritis in her sister; Stomach cancer in her father. There is no history of Neuropathy, Multiple sclerosis, or Colon cancer.  ROS:   Please see the history of present illness.    Review of Systems  Constitution: Positive for malaise/fatigue.  Cardiovascular: Positive for chest pain and dyspnea on exertion.  Respiratory: Positive for shortness of breath and wheezing.   Neurological: Positive for dizziness and headaches.   All other systems reviewed and are negative.   EKGs/Labs/Other Test Reviewed:    EKG:  EKG is  ordered today.  The ekg ordered today demonstrates normal sinus rhythm, heart rate 80, QTC 512 ms, no change from prior tracings  Recent Labs: 05/19/2016: TSH 1.25 06/21/2016: Hemoglobin 10.9; NT-Pro BNP 128; Platelets 293 06/28/2016: Magnesium 2.0 09/21/2016: ALT 11; BUN 18; Creatinine, Ser 0.77; Potassium 4.2; Sodium 144   Recent Lipid Panel Lab Results  Component Value Date/Time   CHOL 190 09/21/2016 08:45 AM   TRIG 105 09/21/2016 08:45 AM   HDL 36 (L) 09/21/2016 08:45 AM   CHOLHDL 5.3 (H) 09/21/2016 08:45 AM   CHOLHDL 4.4 10/06/2011 05:00 AM   LDLCALC 133 (H) 09/21/2016 08:45 AM    Physical Exam:    VS:  BP 124/72   Pulse 85   Ht _0  (1.575 m)   Wt 208 lb 6.4 oz (94.5 kg)   LMP  (LMP Unknown)   SpO2 98%   BMI 38.12 kg/m     Wt Readings from Last 3 Encounters:  12/10/16 208 lb  6.4 oz (94.5 kg)  11/23/16 204 lb (92.5 kg)  10/28/16 204 lb (92.5 kg)     Physical Exam  Constitutional: She is oriented  to person, place, and time. She appears well-developed and well-nourished. No distress.  HENT:  Head: Normocephalic and atraumatic.  Eyes: No scleral icterus.  Neck: No JVD present.  Cardiovascular: Normal rate and regular rhythm.   No murmur heard. Pulmonary/Chest: Effort normal. She has no rales.  Abdominal: Soft.  Musculoskeletal: She exhibits no edema.  Neurological: She is alert and oriented to person, place, and time.  Skin: Skin is warm and dry.    ASSESSMENT:    1. Coronary artery disease involving coronary bypass graft of native heart with angina pectoris ([place])   2. Chronic diastolic CHF (congestive heart failure) ([place])   3. HTN (hypertension), benign   4. Hyperlipidemia, unspecified hyperlipidemia type   5. Gastroesophageal reflux disease, esophagitis presence not specified    PLAN:    In order of problems listed above:  1.  Coronary artery disease involving coronary bypass graft of native heart with angina pectoris ([place])  History of CABG.  Cardiac catheterization in March 2014 demonstrated 2 out of 3 grafts patent.  Nuclear stress test in December 2017 was low risk.  She presents today with complaints of chest discomfort with minimal activity that seems to be consistent with CCS class III angina.  The symptoms seem to be different than what she has had in the past that prompted her stress testing.  Lately, she feels that her symptoms are more reminiscent of her angina prior to her bypass.  She has been able to take nitroglycerin with relief.  She tries to limit this because it does cause a headache.  I have recommended proceeding with cardiac catheterization.  I have reviewed this with Dr. Angelena Form (Dr. of the day) who agrees.  Risks and benefits of cardiac catheterization have been discussed with the patient.  These include bleeding, infection, kidney  damage, stroke, heart attack, death.  The patient understands these risks and is willing to proceed.   -Increase isosorbide to 90 mg daily  -Decrease aspirin 80 mg daily  -Continue Plavix 75 mg daily  -Continue evolocumab, beta-blocker  -Arrange cardiac catheterization next week.  Obtain labs today.  2.  Chronic diastolic CHF (congestive heart failure) ([facility])  Volume appears stable.  Continue current regimen.  3.  HTN (hypertension), benign - Plan: Basic Metabolic Panel (BMET) The patient's blood pressure is controlled on her current regimen.  Continue current therapy.   4.  Hyperlipidemia, unspecified hyperlipidemia type Continue statin.  5.  Gastroesophageal reflux disease, esophagitis presence not specified She was recently set up for EGD.  This will need to be placed on hold until after her cardiac catheterization.  If she does not require PCI, she may proceed with her EGD and hold Plavix if necessary.  If she requires PCI, she will likely need to remain on Plavix and aspirin without interruption for at least 6 months, ideally 12 months.  Since she does take Plavix, I have recommended that she stop taking omeprazole.  I have given her Protonix 40 mg twice daily.   Dispo:  Return in about 2 weeks (around 12/24/2016) for Post Procedure Follow Up, w/ Dr. Acie Fredrickson.   Medication Adjustments/Labs and Tests Ordered: Current medicines are reviewed at length with the patient today.  Concerns regarding medicines are outlined above.  Tests Ordered: Orders Placed This Encounter  Procedures  . Basic Metabolic Panel (BMET)  . CBC  . INR/PT  . EKG 12-Lead   Medication Changes: Meds ordered this encounter  Medications  . clopidogrel (PLAVIX) 75 MG tablet  Sig: Take 1 tablet (75 mg total) by mouth daily.    Dispense:  90 tablet    Refill:  3  . aspirin EC 81 MG tablet    Sig: Take 1 tablet (81 mg total) by mouth daily.  . isosorbide mononitrate (IMDUR) 60 MG 24 hr tablet    Sig: Take 1.5  tablets (90 mg total) by mouth daily.    Dispense:  1353 tablet    Refill:  3    DOSE INCREASE  . nitroGLYCERIN (NITROSTAT) 0.4 MG SL tablet    Sig: Place 1 tablet (0.4 mg total) under the tongue every 5 (five) minutes as needed.    Dispense:  25 tablet    Refill:  3  . pantoprazole (PROTONIX) 40 MG tablet    Sig: Take 1 tablet (40 mg total) by mouth 2 (two) times daily before a meal.    Dispense:  180 tablet    Refill:  3    Signed, Richardson Dopp, PA-C  12/10/2016 1:56 PM    [facility] [address], [place], [place]  [address] Phone: [phone]; Fax: [phone]

## 2016-12-11 LAB — CBC
HEMATOCRIT: 34.1 % (ref 34.0–46.6)
HEMOGLOBIN: 10.6 g/dL — AB (ref 11.1–15.9)
MCH: 24.4 pg — ABNORMAL LOW (ref 26.6–33.0)
MCHC: 31.1 g/dL — AB (ref 31.5–35.7)
MCV: 78 fL — ABNORMAL LOW (ref 79–97)
Platelets: 270 10*3/uL (ref 150–379)
RBC: 4.35 x10E6/uL (ref 3.77–5.28)
RDW: 16.1 % — ABNORMAL HIGH (ref 12.3–15.4)
WBC: 6.1 10*3/uL (ref 3.4–10.8)

## 2016-12-11 LAB — PROTIME-INR
INR: 1 (ref 0.8–1.2)
Prothrombin Time: 10.7 s (ref 9.1–12.0)

## 2016-12-11 LAB — BASIC METABOLIC PANEL
BUN / CREAT RATIO: 19 (ref 12–28)
BUN: 14 mg/dL (ref 8–27)
CALCIUM: 8.9 mg/dL (ref 8.7–10.3)
CHLORIDE: 103 mmol/L (ref 96–106)
CO2: 31 mmol/L — ABNORMAL HIGH (ref 20–29)
CREATININE: 0.74 mg/dL (ref 0.57–1.00)
GFR calc Af Amer: 90 mL/min/{1.73_m2} (ref >59)
GFR calc non Af Amer: 78 mL/min/{1.73_m2} (ref >59)
GLUCOSE: 120 mg/dL — AB (ref 65–99)
Potassium: 4 mmol/L (ref 3.5–5.2)
Sodium: 146 mmol/L — ABNORMAL HIGH (ref 134–144)

## 2016-12-12 NOTE — Progress Notes (Signed)
Subjective:    Patient ID: [name], female    DOB: 04-May-1940, 76 y.o.   MRN: [id number]  HPI   Medications and allergies reviewed with patient and updated if appropriate.  Patient Active Problem List   Diagnosis Date Noted  . Insomnia 11/23/2016  . Chronic nonintractable headache 11/23/2016  . Pancreatic insufficiency 10/28/2016  . Carotid artery disease ([place]) 05/19/2016  . Fatigue 05/19/2016  . Anemia 03/27/2016  . Severe persistent asthma 03/18/2016  . Type 1 diabetes mellitus ([place]) 07/21/2015  . B12 deficiency 03/31/2015  . OSA (obstructive sleep apnea) 11/20/2014  . Fecal incontinence 08/28/2014  . 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 Falls 08/28/2014  . Neck pain of over 3 months duration 08/28/2014  . H/O total knee replacement 02/20/2014  . Obesity (BMI 30-39.9) 11/12/2013  . Chronic diastolic CHF (congestive heart failure) ([place]) 11/17/2012  . Meniscus, lateral, anterior horn derangement 11/10/2011  . Medial meniscus, posterior horn derangement 11/10/2011  . Osteoarthritis of right knee 11/10/2011  . Vertigo 10/05/2011  . HTN (hypertension), benign 10/05/2011  . Coronary artery disease 08/27/2010  . MUSCLE CRAMPS 12/29/2009  . History of myocardial infarction 11/19/2009  . Extrinsic asthma 11/19/2009  . GERD 11/19/2009  . Hyperlipidemia 11/18/2009    Current Outpatient Medications on File Prior to Visit  Medication Sig Dispense Refill  . albuterol (PROVENTIL HFA;VENTOLIN HFA) 108 (90 BASE) MCG/ACT inhaler Inhale 2 puffs into the lungs every 6 (six) hours as needed. For shortness of breath. 1 Inhaler 0  . albuterol (PROVENTIL) (2.5 MG/3ML) 0.083% nebulizer solution Take 3 mLs (2.5 mg total) by nebulization every 6 (six) hours as needed for wheezing or shortness of breath. 360 mL 3  . ALPRAZolam (XANAX) 0.5 MG tablet Take 0.5 mg by mouth 2 (two) times daily as needed for anxiety.     Marland Kitchen aspirin EC 81 MG tablet Take 1 tablet (81 mg total) by mouth  daily.    . benzonatate (TESSALON) 100 MG capsule Take 100 mg by mouth 2 (two) times daily as needed for cough.     . Blood Glucose Monitoring Suppl (ONE TOUCH ULTRA MINI) w/Device KIT 3 (three) times daily. for testing  0  . budesonide-formoterol (SYMBICORT) 160-4.5 MCG/ACT inhaler Inhale 2 puffs into the lungs 2 (two) times daily. 2 Inhaler 0  . Calcium & Magnesium Carbonates (MYLANTA PO) Take 1 tablet by mouth daily as needed (for indigestion).     . Cholecalciferol (VITAMIN D) 2000 UNITS tablet Take 2,000 Units by mouth daily.     . clopidogrel (PLAVIX) 75 MG tablet Take 1 tablet (75 mg total) by mouth daily. 90 tablet 3  . cyclobenzaprine (FLEXERIL) 5 MG tablet TAKE 1 TABLET BY MOUTH AT BEDTIME 30 tablet 5  . diclofenac sodium (VOLTAREN) 1 % GEL Apply 2 g topically every 4 (four) hours.    Marland Kitchen diltiazem (CARDIZEM CD) 240 MG 24 hr capsule Take 1 capsule (240 mg total) by mouth daily. 30 capsule 10  . Evolocumab (REPATHA SURECLICK) 195 MG/ML SOAJ Inject 1 pen into the skin every 14 (fourteen) days. 2 pen 11  . fexofenadine (ALLEGRA) 180 MG tablet Take 1 tablet (180 mg total) by mouth daily. 30 tablet 6  . furosemide (LASIX) 40 MG tablet Take 80 mg by mouth every other day.     . gabapentin (NEURONTIN) 100 MG capsule Take 300 mg at night 90 capsule 3  . insulin lispro protamine-insulin lispro (HUMALOG 75/25) (75-25) 100 UNIT/ML SUSP Inject 20-30 Units  into the skin 2 (two) times daily with a meal. Takes 20 units in the morning and 30  units with supper    . isosorbide mononitrate (IMDUR) 60 MG 24 hr tablet Take 1.5 tablets (90 mg total) by mouth daily. 135 tablet 3  . loperamide (IMODIUM) 2 MG capsule Take 2 mg by mouth as needed for diarrhea or loose stools.     . meclizine (ANTIVERT) 25 MG tablet Take 25 mg by mouth 2 (two) times daily as needed for dizziness.     . metoprolol succinate (TOPROL-XL) 50 MG 24 hr tablet Take 1 tablet (50 mg total) by mouth daily. 30 tablet 10  . montelukast  (SINGULAIR) 5 MG chewable tablet Chew 1 tablet (5 mg total) by mouth at bedtime. 30 tablet 3  . Multiple Vitamin (MULTIVITAMIN WITH MINERALS) TABS Take 1 tablet by mouth daily.    . Nebulizers (COMPRESSOR/NEBULIZER) MISC Use with albuterol 1 each 0  . nitroGLYCERIN (NITROSTAT) 0.4 MG SL tablet Place 1 tablet (0.4 mg total) under the tongue every 5 (five) minutes as needed. 25 tablet 3  . ONE TOUCH ULTRA TEST test strip CHECK BLOOD SUGAR TWICE A DAY DX: E11.9  3  . ONETOUCH DELICA LANCETS 74J MISC 3 (three) times daily. for testing  0  . Pancrelipase, Lip-Prot-Amyl, 24000 units CPEP Take 2 capsules (48,000 Units total) by mouth 3 (three) times daily with meals. Take 1 capsule with a snack 210 capsule 6  . pantoprazole (PROTONIX) 40 MG tablet Take 1 tablet (40 mg total) by mouth 2 (two) times daily before a meal. 180 tablet 3  . potassium chloride SA (K-DUR,KLOR-CON) 20 MEQ tablet Take 20 mEq by mouth 2 (two) times daily.    Marland Kitchen Spacer/Aero-Holding Chambers (AEROCHAMBER MV) inhaler Use as instructed 1 each 0  . traZODone (DESYREL) 50 MG tablet Take 1-2 tablets (50-100 mg total) by mouth at bedtime as needed for sleep. 60 tablet 3   Current Facility-Administered Medications on File Prior to Visit  Medication Dose Route Frequency Provider Last Rate Last Dose  . cyanocobalamin ((VITAMIN B-12)) injection 1,000 mcg  1,000 mcg Intramuscular Q30 days Binnie Rail, MD   1,000 mcg at 03/26/16 1658    Past Medical History:  Diagnosis Date  . Acute myocardial infarction, unspecified site, episode of care unspecified   . Adenomatous colon polyp   . Anxiety   . Chest pain   . Coronary artery disease   . Diabetes mellitus   . GERD (gastroesophageal reflux disease)   . Hiatal hernia   . Hyperlipidemia   . Hypertension   . PONV (postoperative nausea and vomiting)   . Schatzki's ring   . Shoulder injury    resolved after shoulder surgery  . Unspecified asthma(493.90)   . Upper respiratory symptom     02-13-14 "runny nose, mild cough,sniffles" -instructed to inform MD if symptoms worsens    Past Surgical History:  Procedure Laterality Date  . ABDOMINAL HYSTERECTOMY    . APPENDECTOMY     came out with Hysterectomy  . CARDIAC CATHETERIZATION  07/23/2009   EF 60%  . CARDIAC CATHETERIZATION  10/11/2008  . CARDIAC CATHETERIZATION  03/01/2007   EF 75-80%  . CARDIOVASCULAR STRESS TEST  11/15/2007   EF 60%  . COLONOSCOPY    . CORONARY ARTERY BYPASS GRAFT     SEVERELY DISEASED SAPHENOUS VEIN GRAFT TO THE RIGHT CORONARY ARTERY BUT WITH FAIRLY WELL PRESERVED FLOW TO THE DISTAL RIGHT CORONARY ARTERY FROM THE NATIVE CIRCULATION-RESTART  CATH IN JUNE 2000, REVEALS MILD/MODERATE  CAD WITH GOOD FLOW DOWN HER LAD  . ESOPHAGEAL DILATION    . ESOPHAGOGASTRODUODENOSCOPY    . EYE SURGERY     bilateral cataract surgery with lens implant  . lense removal Left   . ROTATOR CUFF REPAIR     right and left  . TONSILLECTOMY     age 76  . tumor removed kidney    . US ECHOCARDIOGRAPHY  03/08/2008   EF 55-60%    Social History   Socioeconomic History  . Marital status: Widowed    Spouse name: Not on file  . Number of children: 4  . Years of education: Designer, jewellery  . Highest education level: Not on file  Social Needs  . Financial resource strain: Not on file  . Food insecurity - worry: Not on file  . Food insecurity - inability: Not on file  . Transportation needs - medical: Not on file  . Transportation needs - non-medical: Not on file  Occupational History  . Occupation: Retired  Tobacco Use  . Smoking status: Former Smoker    Packs/day: 0.50    Years: 21.00    Pack years: 10.50    Types: Cigarettes    Start date: 02/09/1955    Last attempt to quit: 02/09/1976    Years since quitting: 40.8  . Smokeless tobacco: Never Used  Substance and Sexual Activity  . Alcohol use: No    Alcohol/week: 0.0 oz  . Drug use: No  . Sexual activity: Not on file  Other Topics Concern  . Not on file  Social History  Narrative   Lives alone.   Caffeine use: Drinks 1 cup coffee/day      Originally from [place]. Previously has lived in [place]. Prior travel to [place], [place], [place], [place], [place], [place], [place], & [place]. Previously worked in [employer] She has a dog currently. No bird, mold, or hot tub exposure. She also pastors a church.     Family History  Problem Relation Age of Onset  . Heart disease Maternal Grandfather   . Heart failure Maternal Grandfather   . Diabetes Maternal Grandfather   . Heart attack Father   . Stomach cancer Father   . Diabetes Mother   . Rheum arthritis Sister   . Emphysema Paternal Uncle   . Esophageal cancer Brother 73       she said he was born with it  . Emphysema Paternal Aunt   . Neuropathy Neg Hx   . Multiple sclerosis Neg Hx   . Colon cancer Neg Hx     Review of Systems     Objective:  There were no vitals filed for this visit. Wt Readings from Last 3 Encounters:  12/10/16 208 lb 6.4 oz (94.5 kg)  11/23/16 204 lb (92.5 kg)  10/28/16 204 lb (92.5 kg)   There is no height or weight on file to calculate BMI.   Physical Exam        Assessment & Plan:    See Problem List for Assessment and Plan of chronic medical problems.   This encounter was created in error - please disregard.

## 2016-12-13 ENCOUNTER — Telehealth: Payer: Self-pay

## 2016-12-13 ENCOUNTER — Telehealth: Payer: Self-pay | Admitting: *Deleted

## 2016-12-13 NOTE — Telephone Encounter (Signed)
Left message to go over lab results.  

## 2016-12-13 NOTE — Telephone Encounter (Signed)
-----   Message from Liliane Shi, [place] sent at 12/12/2016  9:41 PM EST ----- Please call the patient Renal function, INR normal.  The hemoglobin is stable. Continue current medications and follow up as planned.  Richardson Dopp, PA-C    12/12/2016 9:41 PM

## 2016-12-13 NOTE — Telephone Encounter (Signed)
DPR ok to [facility]. LMOM lab work ok, kidney function normal, INR normal, HgB stable. Continue on current Tx plan. Any questions please call [phone]

## 2016-12-13 NOTE — Telephone Encounter (Signed)
Patient contacted pre-catheterization at [facility] scheduled for:  12/14/2016 @ 1200 Verified arrival time and place:  NT @ 1000 Confirmed AM meds to be taken pre-cath with sip of water: Take ASA and Plavix Hold lasix, potassium am of Hold insulin am of Pt states she is going to take her ASA and Plavix and that's it Confirmed patient has responsible person to drive home post procedure and observe patient for 24 hours:  yes Addl concerns:  none

## 2016-12-14 ENCOUNTER — Ambulatory Visit (HOSPITAL_COMMUNITY)
Admission: RE | Admit: 2016-12-14 | Discharge: 2016-12-14 | Disposition: A | Discharge status: Home / Self Care | Payer: Medicare Other | Source: Ambulatory Visit | Attending: Interventional Cardiology | Admitting: Interventional Cardiology

## 2016-12-14 ENCOUNTER — Encounter: Payer: Medicare Other | Admitting: Internal Medicine

## 2016-12-14 ENCOUNTER — Encounter (HOSPITAL_COMMUNITY)
Admission: RE | Disposition: A | Discharge status: Home / Self Care | Payer: Self-pay | Source: Ambulatory Visit | Attending: Interventional Cardiology

## 2016-12-14 ENCOUNTER — Encounter: Payer: Self-pay | Admitting: Gastroenterology

## 2016-12-14 DIAGNOSIS — I5032 Chronic diastolic (congestive) heart failure: Secondary | ICD-10-CM | POA: Insufficient documentation

## 2016-12-14 DIAGNOSIS — I252 Old myocardial infarction: Secondary | ICD-10-CM | POA: Diagnosis not present

## 2016-12-14 DIAGNOSIS — I110 Hypertensive heart disease with heart failure: Secondary | ICD-10-CM | POA: Insufficient documentation

## 2016-12-14 DIAGNOSIS — Z794 Long term (current) use of insulin: Secondary | ICD-10-CM | POA: Diagnosis not present

## 2016-12-14 DIAGNOSIS — Z87891 Personal history of nicotine dependence: Secondary | ICD-10-CM | POA: Insufficient documentation

## 2016-12-14 DIAGNOSIS — F419 Anxiety disorder, unspecified: Secondary | ICD-10-CM | POA: Diagnosis not present

## 2016-12-14 DIAGNOSIS — Z800 Family history of malignant neoplasm of digestive organs: Secondary | ICD-10-CM | POA: Insufficient documentation

## 2016-12-14 DIAGNOSIS — Z882 Allergy status to sulfonamides status: Secondary | ICD-10-CM | POA: Insufficient documentation

## 2016-12-14 DIAGNOSIS — Z825 Family history of asthma and other chronic lower respiratory diseases: Secondary | ICD-10-CM | POA: Insufficient documentation

## 2016-12-14 DIAGNOSIS — Z86010 Personal history of colonic polyps: Secondary | ICD-10-CM | POA: Diagnosis not present

## 2016-12-14 DIAGNOSIS — I050 Rheumatic mitral stenosis: Secondary | ICD-10-CM | POA: Diagnosis not present

## 2016-12-14 DIAGNOSIS — J45909 Unspecified asthma, uncomplicated: Secondary | ICD-10-CM | POA: Insufficient documentation

## 2016-12-14 DIAGNOSIS — Z91018 Allergy to other foods: Secondary | ICD-10-CM | POA: Insufficient documentation

## 2016-12-14 DIAGNOSIS — Z96651 Presence of right artificial knee joint: Secondary | ICD-10-CM | POA: Diagnosis not present

## 2016-12-14 DIAGNOSIS — Z8249 Family history of ischemic heart disease and other diseases of the circulatory system: Secondary | ICD-10-CM | POA: Insufficient documentation

## 2016-12-14 DIAGNOSIS — J449 Chronic obstructive pulmonary disease, unspecified: Secondary | ICD-10-CM | POA: Diagnosis not present

## 2016-12-14 DIAGNOSIS — R1310 Dysphagia, unspecified: Secondary | ICD-10-CM | POA: Insufficient documentation

## 2016-12-14 DIAGNOSIS — Z833 Family history of diabetes mellitus: Secondary | ICD-10-CM | POA: Insufficient documentation

## 2016-12-14 DIAGNOSIS — Z885 Allergy status to narcotic agent status: Secondary | ICD-10-CM | POA: Insufficient documentation

## 2016-12-14 DIAGNOSIS — K222 Esophageal obstruction: Secondary | ICD-10-CM | POA: Diagnosis not present

## 2016-12-14 DIAGNOSIS — E119 Type 2 diabetes mellitus without complications: Secondary | ICD-10-CM | POA: Insufficient documentation

## 2016-12-14 DIAGNOSIS — R609 Edema, unspecified: Secondary | ICD-10-CM | POA: Insufficient documentation

## 2016-12-14 DIAGNOSIS — E785 Hyperlipidemia, unspecified: Secondary | ICD-10-CM | POA: Insufficient documentation

## 2016-12-14 DIAGNOSIS — Z79899 Other long term (current) drug therapy: Secondary | ICD-10-CM | POA: Diagnosis not present

## 2016-12-14 DIAGNOSIS — K219 Gastro-esophageal reflux disease without esophagitis: Secondary | ICD-10-CM | POA: Insufficient documentation

## 2016-12-14 DIAGNOSIS — K449 Diaphragmatic hernia without obstruction or gangrene: Secondary | ICD-10-CM | POA: Insufficient documentation

## 2016-12-14 DIAGNOSIS — Z7902 Long term (current) use of antithrombotics/antiplatelets: Secondary | ICD-10-CM | POA: Insufficient documentation

## 2016-12-14 DIAGNOSIS — Z7982 Long term (current) use of aspirin: Secondary | ICD-10-CM | POA: Diagnosis not present

## 2016-12-14 DIAGNOSIS — I25709 Atherosclerosis of coronary artery bypass graft(s), unspecified, with unspecified angina pectoris: Secondary | ICD-10-CM | POA: Diagnosis not present

## 2016-12-14 DIAGNOSIS — Z90710 Acquired absence of both cervix and uterus: Secondary | ICD-10-CM | POA: Diagnosis not present

## 2016-12-14 DIAGNOSIS — Z8261 Family history of arthritis: Secondary | ICD-10-CM | POA: Insufficient documentation

## 2016-12-14 DIAGNOSIS — I2510 Atherosclerotic heart disease of native coronary artery without angina pectoris: Secondary | ICD-10-CM | POA: Diagnosis present

## 2016-12-14 DIAGNOSIS — Z888 Allergy status to other drugs, medicaments and biological substances status: Secondary | ICD-10-CM | POA: Insufficient documentation

## 2016-12-14 HISTORY — PX: LEFT HEART CATH AND CORS/GRAFTS ANGIOGRAPHY: CATH118250

## 2016-12-14 LAB — GLUCOSE, CAPILLARY: Glucose-Capillary: 90 mg/dL (ref 65–99)

## 2016-12-14 SURGERY — LEFT HEART CATH AND CORS/GRAFTS ANGIOGRAPHY
Anesthesia: LOCAL

## 2016-12-14 MED ORDER — VERAPAMIL HCL 2.5 MG/ML IV SOLN
INTRAVENOUS | Status: DC | PRN
  Administered 2016-12-14: 10 mL via INTRA_ARTERIAL

## 2016-12-14 MED ORDER — FENTANYL CITRATE (PF) 100 MCG/2ML IJ SOLN
INTRAMUSCULAR | Status: AC
  Filled 2016-12-14: qty 2

## 2016-12-14 MED ORDER — SODIUM CHLORIDE 0.9 % WEIGHT BASED INFUSION: 1.0000 mL/kg/h | INTRAVENOUS | Status: DC

## 2016-12-14 MED ORDER — MIDAZOLAM HCL 2 MG/2ML IJ SOLN
INTRAMUSCULAR | Status: AC
  Filled 2016-12-14: qty 2

## 2016-12-14 MED ORDER — SODIUM CHLORIDE 0.9 % IV SOLN: INTRAVENOUS | Status: AC

## 2016-12-14 MED ORDER — MIDAZOLAM HCL 2 MG/2ML IJ SOLN
INTRAMUSCULAR | Status: DC | PRN
  Administered 2016-12-14: 1 mg via INTRAVENOUS
  Administered 2016-12-14: 2 mg via INTRAVENOUS

## 2016-12-14 MED ORDER — ASPIRIN 81 MG PO CHEW: 81.0000 mg | CHEWABLE_TABLET | ORAL | Status: DC

## 2016-12-14 MED ORDER — FENTANYL CITRATE (PF) 100 MCG/2ML IJ SOLN
INTRAMUSCULAR | Status: DC | PRN
Start: 2016-12-14 — End: 2016-12-14
  Administered 2016-12-14 (×3): 25 ug via INTRAVENOUS

## 2016-12-14 MED ORDER — SODIUM CHLORIDE 0.9 % WEIGHT BASED INFUSION
3.0000 mL/kg/h | INTRAVENOUS | Status: AC
  Administered 2016-12-14: 3 mL/kg/h via INTRAVENOUS

## 2016-12-14 MED ORDER — IOPAMIDOL (ISOVUE-370) INJECTION 76%
INTRAVENOUS | Status: AC
  Filled 2016-12-14: qty 125

## 2016-12-14 MED ORDER — LIDOCAINE HCL (PF) 1 % IJ SOLN
INTRAMUSCULAR | Status: AC
  Filled 2016-12-14: qty 30

## 2016-12-14 MED ORDER — HEPARIN (PORCINE) IN NACL 2-0.9 UNIT/ML-% IJ SOLN
INTRAMUSCULAR | Status: AC
  Filled 2016-12-14: qty 1000

## 2016-12-14 MED ORDER — SODIUM CHLORIDE 0.9% FLUSH: 3.0000 mL | Freq: Two times a day (BID) | INTRAVENOUS | Status: DC

## 2016-12-14 MED ORDER — VERAPAMIL HCL 2.5 MG/ML IV SOLN
INTRAVENOUS | Status: AC
  Filled 2016-12-14: qty 2

## 2016-12-14 MED ORDER — LIDOCAINE HCL (PF) 1 % IJ SOLN
INTRAMUSCULAR | Status: DC | PRN
  Administered 2016-12-14: 2 mL

## 2016-12-14 MED ORDER — IOPAMIDOL (ISOVUE-370) INJECTION 76%
INTRAVENOUS | Status: DC | PRN
Start: 2016-12-14 — End: 2016-12-14
  Administered 2016-12-14: 75 mL via INTRA_ARTERIAL

## 2016-12-14 MED ORDER — SODIUM CHLORIDE 0.9% FLUSH: 3.0000 mL | INTRAVENOUS | Status: DC | PRN

## 2016-12-14 MED ORDER — HEPARIN SODIUM (PORCINE) 1000 UNIT/ML IJ SOLN
INTRAMUSCULAR | Status: DC | PRN
  Administered 2016-12-14: 5000 [IU] via INTRAVENOUS

## 2016-12-14 MED ORDER — HEPARIN (PORCINE) IN NACL 2-0.9 UNIT/ML-% IJ SOLN
INTRAMUSCULAR | Status: AC | PRN
  Administered 2016-12-14: 1000 mL

## 2016-12-14 MED ORDER — SODIUM CHLORIDE 0.9 % IV SOLN: 250.0000 mL | INTRAVENOUS | Status: DC | PRN

## 2016-12-14 MED ORDER — HEPARIN SODIUM (PORCINE) 1000 UNIT/ML IJ SOLN
INTRAMUSCULAR | Status: AC
  Filled 2016-12-14: qty 1

## 2016-12-14 SURGICAL SUPPLY — 11 items
CATH INFINITI 5 FR IM (CATHETERS) ×1 IMPLANT
CATH INFINITI 5FR JL4 (CATHETERS) ×1 IMPLANT
CATH INFINITI JR4 5F (CATHETERS) ×1 IMPLANT
DEVICE RAD COMP TR BAND LRG (VASCULAR PRODUCTS) ×1 IMPLANT
GLIDESHEATH SLEND SS 6F .021 (SHEATH) ×1 IMPLANT
GUIDEWIRE INQWIRE 1.5J.035X260 (WIRE) IMPLANT
INQWIRE 1.5J .035X260CM (WIRE) ×2
KIT HEART LEFT (KITS) ×2 IMPLANT
PACK CARDIAC CATHETERIZATION (CUSTOM PROCEDURE TRAY) ×2 IMPLANT
TRANSDUCER W/STOPCOCK (MISCELLANEOUS) ×2 IMPLANT
TUBING CIL FLEX 10 FLL-RA (TUBING) ×2 IMPLANT

## 2016-12-14 NOTE — Discharge Instructions (Signed)

## 2016-12-14 NOTE — Interval H&P Note (Signed)
Cath Lab Visit (complete for each Cath Lab visit)  Clinical Evaluation Leading to the Procedure:    ACS: No.  Non-ACS:    Anginal Classification: CCS III  Anti-ischemic medical therapy: Minimal Therapy (1 class of medications)  Non-Invasive Test Results: No non-invasive testing performed  Prior CABG: Previous CABG      History and Physical Interval Note:  12/14/2016 3:23 PM  [name]  has presented today for surgery, with the diagnosis of Chest Pain  The various methods of treatment have been discussed with the patient and family. After consideration of risks, benefits and other options for treatment, the patient has consented to  Procedure(s): LEFT HEART CATH AND CORS/GRAFTS ANGIOGRAPHY (N/A) as a surgical intervention .  The patient's history has been reviewed, patient examined, no change in status, stable for surgery.  I have reviewed the patient's chart and labs.  Questions were answered to the patient's satisfaction.     [name]

## 2016-12-15 ENCOUNTER — Encounter (HOSPITAL_COMMUNITY): Payer: Self-pay | Admitting: Interventional Cardiology

## 2016-12-20 NOTE — Telephone Encounter (Signed)
Patient had a cardiac cath last week and have received no response from Priscille Loveless, [place].  Will ask Dr. Irish Lack that performed craterization last week

## 2016-12-20 NOTE — Telephone Encounter (Signed)
12/20/2016   RE: [name] DOB: 1940-07-03 MRN: [id number]   Dear Irish Lack,     We have scheduled the above patient for an endoscopic procedure. Our records show that she is on anticoagulation therapy.   Please advise as to how long the patient may come off her therapy of Plavix prior to the procedure, which is scheduled for 12/28/16.  Please route the completed form to Barb Merino, Therapist, sports .   Sincerely,

## 2016-12-20 NOTE — Telephone Encounter (Signed)
All antiplatelets to be clear by cardiologist.  Harrington Challenger PharmD, BCPS, [place] [address] 12/20/2016 11:03 AM

## 2016-12-20 NOTE — Telephone Encounter (Signed)
   Chart reviewed as part of pre-operative protocol coverage.   Pt has EGD scheduled 12/28/16. Recently saw Richardson Dopp PA-C for progressive chest pain. Subsequent underwent cath 12/14/16, no PCI performed, medical therapy recommended. I will forward this message to Dr. Irish Lack to find out if a) cleared and b) if OK to hold Plavix in prep for EGD.  Dr. Irish Lack, please send your reply to P CV DIV PREOP pool. Thanks.   Charlie Pitter, PA-C 12/20/2016, 1:04 PM

## 2016-12-20 NOTE — Telephone Encounter (Signed)
No stents placed so I think it should be ok to hold Plavix.  Will confirm with Dr. Acie Fredrickson who sees this patient regularly.

## 2016-12-21 NOTE — Telephone Encounter (Signed)
Routing to requested party. Kwame Ryland PA-C

## 2016-12-21 NOTE — Telephone Encounter (Signed)
Pt may hold Plavix for 5 days prior to colonoscopy She is at low risk for procedure

## 2016-12-22 ENCOUNTER — Telehealth: Payer: Self-pay | Admitting: Cardiovascular Disease

## 2016-12-22 ENCOUNTER — Other Ambulatory Visit: Payer: Medicare Other | Admitting: *Deleted

## 2016-12-22 DIAGNOSIS — E782 Mixed hyperlipidemia: Secondary | ICD-10-CM

## 2016-12-22 LAB — HEPATIC FUNCTION PANEL
ALK PHOS: 151 IU/L — AB (ref 39–117)
ALT: 12 IU/L (ref 0–32)
AST: 16 IU/L (ref 0–40)
Albumin: 3.9 g/dL (ref 3.5–4.8)
BILIRUBIN, DIRECT: 0.09 mg/dL (ref 0.00–0.40)
Bilirubin Total: <0.2 mg/dL (ref 0.0–1.2)
Total Protein: 6.4 g/dL (ref 6.0–8.5)

## 2016-12-22 LAB — LIPID PANEL
CHOL/HDL RATIO: 2.8 ratio (ref 0.0–4.4)
CHOLESTEROL TOTAL: 119 mg/dL (ref 100–199)
HDL: 43 mg/dL (ref >39)
LDL Calculated: 62 mg/dL (ref 0–99)
TRIGLYCERIDES: 71 mg/dL (ref 0–149)
VLDL Cholesterol Cal: 14 mg/dL (ref 5–40)

## 2016-12-22 NOTE — Telephone Encounter (Signed)
New message     Patient calling states she is having cramps in her body, thinks it is from the [employer]. Wants to know  If she can be changed to something else.   Pt c/o medication issue:  1. Name of Medication: Repatha  2. How are you currently taking this medication (dosage and times per day)? As prescribed  3. Are you having a reaction (difficulty breathing--STAT)? NO  4. What is your medication issue? Cramps in legs, thighs and sides.

## 2016-12-23 ENCOUNTER — Ambulatory Visit (INDEPENDENT_AMBULATORY_CARE_PROVIDER_SITE_OTHER): Payer: Medicare Other | Admitting: Pulmonary Disease

## 2016-12-23 ENCOUNTER — Encounter: Payer: Self-pay | Admitting: Pulmonary Disease

## 2016-12-23 VITALS — BP 145/72 | HR 72 | Ht 63.0 in | Wt 207.0 lb

## 2016-12-23 DIAGNOSIS — J309 Allergic rhinitis, unspecified: Secondary | ICD-10-CM | POA: Diagnosis not present

## 2016-12-23 DIAGNOSIS — J4550 Severe persistent asthma, uncomplicated: Secondary | ICD-10-CM | POA: Diagnosis not present

## 2016-12-23 DIAGNOSIS — K219 Gastro-esophageal reflux disease without esophagitis: Secondary | ICD-10-CM | POA: Diagnosis not present

## 2016-12-23 DIAGNOSIS — R0602 Shortness of breath: Secondary | ICD-10-CM

## 2016-12-23 LAB — PULMONARY FUNCTION TEST
DL/VA % pred: 73 %
DL/VA: 3.45 ml/min/mmHg/L
DLCO UNC % PRED: 45 %
DLCO unc: 10.35 ml/min/mmHg
FEF 25-75 POST: 0.66 L/s
FEF 25-75 PRE: 0.55 L/s
FEF2575-%CHANGE-POST: 20 %
FEF2575-%PRED-POST: 50 %
FEF2575-%PRED-PRE: 42 %
FEV1-%CHANGE-POST: 6 %
FEV1-%PRED-POST: 67 %
FEV1-%Pred-Pre: 63 %
FEV1-Post: 1.03 L
FEV1-Pre: 0.96 L
FEV1FVC-%Change-Post: 1 %
FEV1FVC-%PRED-PRE: 88 %
FEV6-%CHANGE-POST: 3 %
FEV6-%PRED-POST: 78 %
FEV6-%Pred-Pre: 76 %
FEV6-Post: 1.49 L
FEV6-Pre: 1.44 L
FEV6FVC-%Pred-Post: 105 %
FEV6FVC-%Pred-Pre: 105 %
FVC-%CHANGE-POST: 4 %
FVC-%PRED-POST: 75 %
FVC-%Pred-Pre: 72 %
FVC-PRE: 1.44 L
FVC-Post: 1.5 L
POST FEV1/FVC RATIO: 68 %
Post FEV6/FVC ratio: 100 %
Pre FEV1/FVC ratio: 67 %
Pre FEV6/FVC Ratio: 100 %
RV % pred: 102 %
RV: 2.36 L
TLC % pred: 78 %
TLC: 3.85 L

## 2016-12-23 MED ORDER — PRAVASTATIN SODIUM 20 MG PO TABS: 20.0000 mg | ORAL_TABLET | Freq: Every evening | ORAL | 11 refills | Status: DC

## 2016-12-23 NOTE — Patient Instructions (Addendum)
   Continue using your Albuterol inhaler as needed.  Use the Symbicort samples we are giving you today by doing 2 puffs twice daily.  Remember to remove any dentures or partials you have before you use your Symbicort inhaler. Remember to brush your teeth & tongue after you use your inhaler as well as rinse, gargle & spit to keep from getting thrush in your mouth or on your tongue (a white film).   Call my office if you have any new breathing problems or questions before your next appointment.

## 2016-12-23 NOTE — Progress Notes (Signed)
Subjective:    Patient ID: [name], female    DOB: [birth year], 76 y.o.   MRN: [id number]  C.C.:  Follow-up for Severe, Persistent Asthma w/ Fixed Airway Obstruction, Chronic Allergic Rhinitis, GERD, & Mild-Moderate OSA.  HPI  Last in office visit for asthma exacerbation was in August. Patient was seen by nurse practitioner & recovering well. She reports she hasn't felt good for the last couple of weeks. She had a syncopal episode on Sunday that occurred when she had a "pull" in her neck, discomfort in her abdomen, chest discomfort & dyspnea. She experienced a similar discomfort once previously when walking up steps. She contacted the Cardiologist this morning and they took her of her new statin. She made them aware of her syncopal event per her report.   Severe, persistent asthma: Patient has fixed airway obstruction on spirometry. Patient has difficulty affording inhaler medication therapies. Previously given samples of Perforomist and Pulmicort inhaler. She has been wheezing more with the weather change. She has been using her Albuterol inhaler only rarely. She has been coughing intermittently and she reports it is not productive.  Chronic allergic rhinitis: Prescribed Singulair and Allegra. She reports no new sinus congestion, pressure or drainage. She doesn't think she is taking the Allegra.   GERD: Previously followed by GI. Previously prescribed Protonix and Pepcid twice a day. At last appointment patient was taking Prilosec twice a day & using Pepcid only as needed. She reports she is using Tums and Prilosec with good control of dyspepsia & reflux.  OSA: Previously followed by Dr. Rexene Alberts with Neurology. CPAP was recalled due to insufficient usage.  Review of Systems  No fever, chills, or sweats. She reports she is still having intermittent chest discomfort with exertion. She denies any palpitations. No other abdominal pain, nausea or emesis. She reports a poor appetite recently as  well. She reports her blood glucose has been fluctuating.   Allergies  Allergen Reactions  . Amlodipine Other (See Comments)    hallucinations   . Banana Nausea And Vomiting    Stomach pumped  . Co Q10 [Coenzyme Q10]     Body cramps  . Codeine     Hallucinate, loose identity and don't know who I am  . Morphine Other (See Comments)    Can not function, it immobilizes me   . Repatha [Evolocumab]     myalgias  . Statins     Muscle cramps  . Sulfonamide Derivatives Swelling   Current Outpatient Medications on File Prior to Visit  Medication Sig Dispense Refill  . albuterol (PROVENTIL HFA;VENTOLIN HFA) 108 (90 BASE) MCG/ACT inhaler Inhale 2 puffs into the lungs every 6 (six) hours as needed. For shortness of breath. 1 Inhaler 0  . albuterol (PROVENTIL) (2.5 MG/3ML) 0.083% nebulizer solution Take 3 mLs (2.5 mg total) by nebulization every 6 (six) hours as needed for wheezing or shortness of breath. 360 mL 3  . alum & mag hydroxide-simeth (MAALOX/MYLANTA) 200-200-20 MG/5ML suspension Take 30 mLs as needed by mouth for indigestion or heartburn.    Marland Kitchen aspirin EC 81 MG tablet Take 1 tablet (81 mg total) by mouth daily.    . baclofen (LIORESAL) 20 MG tablet Take 20 mg 2 (two) times daily by mouth.    . benzonatate (TESSALON) 100 MG capsule Take 100 mg by mouth 2 (two) times daily as needed for cough.     . Blood Glucose Monitoring Suppl (ONE TOUCH ULTRA MINI) w/Device KIT 3 (three) times daily. for  testing  0  . budesonide-formoterol (SYMBICORT) 160-4.5 MCG/ACT inhaler Inhale 2 puffs into the lungs 2 (two) times daily. (Patient taking differently: Inhale 2 puffs 2 (two) times daily as needed into the lungs (shortness of breath). ) 2 Inhaler 0  . calcium carbonate (TUMS - DOSED IN MG ELEMENTAL CALCIUM) 500 MG chewable tablet Chew 2 tablets daily as needed by mouth for indigestion or heartburn.    . Cholecalciferol (VITAMIN D) 2000 UNITS tablet Take 2,000 Units by mouth daily.     . clonazePAM  (KLONOPIN) 0.5 MG tablet Take 0.5 mg daily by mouth.    . clopidogrel (PLAVIX) 75 MG tablet Take 1 tablet (75 mg total) by mouth daily. 90 tablet 3  . Cyanocobalamin (B-12 COMPLIANCE INJECTION) 1000 MCG/ML KIT Inject 1,000 mcg every 30 (thirty) days as directed.    . cyclobenzaprine (FLEXERIL) 5 MG tablet TAKE 1 TABLET BY MOUTH AT BEDTIME 30 tablet 5  . diclofenac sodium (VOLTAREN) 1 % GEL Apply 2 g topically every 4 (four) hours. (Patient taking differently: Apply 2 g every 4 (four) hours as needed topically (muscle pain). )    . diltiazem (CARDIZEM CD) 240 MG 24 hr capsule Take 1 capsule (240 mg total) by mouth daily. 30 capsule 10  . fexofenadine (ALLEGRA) 180 MG tablet Take 1 tablet (180 mg total) by mouth daily. 30 tablet 6  . furosemide (LASIX) 40 MG tablet Take 40 mg every other day by mouth.     . gabapentin (NEURONTIN) 100 MG capsule Take 300 mg at night 90 capsule 3  . insulin lispro protamine-insulin lispro (HUMALOG 75/25) (75-25) 100 UNIT/ML SUSP Inject 20-30 Units into the skin 2 (two) times daily with a meal. Takes 20 units in the morning and 30  units with supper    . isosorbide mononitrate (IMDUR) 60 MG 24 hr tablet Take 1.5 tablets (90 mg total) by mouth daily. 135 tablet 3  . loperamide (IMODIUM) 2 MG capsule Take 2 mg by mouth as needed for diarrhea or loose stools.     . meclizine (ANTIVERT) 25 MG tablet Take 25 mg by mouth 2 (two) times daily as needed for dizziness.     . methocarbamol (ROBAXIN) 500 MG tablet Take 500 mg 2 (two) times daily by mouth.    . metoprolol succinate (TOPROL-XL) 50 MG 24 hr tablet Take 1 tablet (50 mg total) by mouth daily. 30 tablet 10  . montelukast (SINGULAIR) 5 MG chewable tablet Chew 1 tablet (5 mg total) by mouth at bedtime. (Patient taking differently: Chew 5 mg at bedtime as needed by mouth (allergies). ) 30 tablet 3  . Multiple Vitamin (MULTIVITAMIN WITH MINERALS) TABS Take 1 tablet by mouth daily.    . Nebulizers (COMPRESSOR/NEBULIZER) MISC  Use with albuterol 1 each 0  . nitroGLYCERIN (NITROSTAT) 0.4 MG SL tablet Place 1 tablet (0.4 mg total) under the tongue every 5 (five) minutes as needed. (Patient taking differently: Place 0.4 mg every 5 (five) minutes as needed under the tongue for chest pain. ) 25 tablet 3  . omeprazole (PRILOSEC) 40 MG capsule Take 40 mg 2 (two) times daily by mouth.    . ONE TOUCH ULTRA TEST test strip CHECK BLOOD SUGAR TWICE A DAY DX: E11.9  3  . ONETOUCH DELICA LANCETS 62H MISC 3 (three) times daily. for testing  0  . Pancrelipase, Lip-Prot-Amyl, 24000 units CPEP Take 2 capsules (48,000 Units total) by mouth 3 (three) times daily with meals. Take 1 capsule with a snack  210 capsule 6  . pantoprazole (PROTONIX) 40 MG tablet Take 1 tablet (40 mg total) by mouth 2 (two) times daily before a meal. 180 tablet 3  . potassium chloride SA (K-DUR,KLOR-CON) 20 MEQ tablet Take 20 mEq daily by mouth.     . pravastatin (PRAVACHOL) 20 MG tablet Take 1 tablet (20 mg total) every evening by mouth. 30 tablet 11  . Spacer/Aero-Holding Chambers (AEROCHAMBER MV) inhaler Use as instructed 1 each 0  . traZODone (DESYREL) 50 MG tablet Take 1-2 tablets (50-100 mg total) by mouth at bedtime as needed for sleep. 60 tablet 3   Current Facility-Administered Medications on File Prior to Visit  Medication Dose Route Frequency Provider Last Rate Last Dose  . cyanocobalamin ((VITAMIN B-12)) injection 1,000 mcg  1,000 mcg Intramuscular Q30 days Binnie Rail, MD   1,000 mcg at 03/26/16 1658   Past Medical History:  Diagnosis Date  . Acute myocardial infarction, unspecified site, episode of care unspecified   . Adenomatous colon polyp   . Anxiety   . Chest pain   . Coronary artery disease   . Diabetes mellitus   . GERD (gastroesophageal reflux disease)   . Hiatal hernia   . Hyperlipidemia   . Hypertension   . PONV (postoperative nausea and vomiting)   . Schatzki's ring   . Shoulder injury    resolved after shoulder surgery  .  Unspecified asthma(493.90)   . Upper respiratory symptom    02-13-14 "runny nose, mild cough,sniffles" -instructed to inform MD if symptoms worsens   Past Surgical History:  Procedure Laterality Date  . ABDOMINAL HYSTERECTOMY    . APPENDECTOMY     came out with Hysterectomy  . CARDIAC CATHETERIZATION  07/23/2009   EF 60%  . CARDIAC CATHETERIZATION  10/11/2008  . CARDIAC CATHETERIZATION  03/01/2007   EF 75-80%  . CARDIOVASCULAR STRESS TEST  11/15/2007   EF 60%  . COLONOSCOPY    . CORONARY ARTERY BYPASS GRAFT     SEVERELY DISEASED SAPHENOUS VEIN GRAFT TO THE RIGHT CORONARY ARTERY BUT WITH FAIRLY WELL PRESERVED FLOW TO THE DISTAL RIGHT CORONARY ARTERY FROM THE NATIVE CIRCULATION-RESTART  CATH IN JUNE 2000, REVEALS MILD/MODERATE  CAD WITH GOOD FLOW DOWN HER LAD  . ESOPHAGEAL DILATION    . ESOPHAGOGASTRODUODENOSCOPY    . EYE SURGERY     bilateral cataract surgery with lens implant  . LEFT HEART CATH AND CORS/GRAFTS ANGIOGRAPHY N/A 12/14/2016   Procedure: LEFT HEART CATH AND CORS/GRAFTS ANGIOGRAPHY;  Surgeon: Jettie Booze, MD;  Location: [facility];  Service: Cardiovascular;  Laterality: N/A;  . lense removal Left   . ROTATOR CUFF REPAIR     right and left  . TONSILLECTOMY     age 49  . 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 tumor removed kidney    . US ECHOCARDIOGRAPHY  03/08/2008   EF 55-60%   Family History  Problem Relation Age of Onset  . Heart disease Maternal Grandfather   . Heart failure Maternal Grandfather   . Diabetes Maternal Grandfather   . Heart attack Father   . Stomach cancer Father   . Diabetes Mother   . Rheum arthritis Sister   . Emphysema Paternal Uncle   . Esophageal cancer Brother 99       she said he was born with it  . Emphysema  Paternal Aunt   . Neuropathy Neg Hx   . Multiple sclerosis Neg Hx   .  Colon cancer Neg Hx    Social History   Socioeconomic History  . Marital status: Widowed    Spouse name: None  . Number of children: 4  . Years of education: Designer, jewellery  . Highest education level: None  Social Needs  . Financial resource strain: None  . Food insecurity - worry: None  . Food insecurity - inability: None  . Transportation needs - medical: None  . Transportation needs - non-medical: None  Occupational History  . Occupation: Retired  Tobacco Use  . Smoking status: Former Smoker    Packs/day: 0.50    Years: 21.00    Pack years: 10.50    Types: Cigarettes    Start date: 02/09/1955    Last attempt to quit: 02/09/1976    Years since quitting: 40.8  . Smokeless tobacco: Never Used  Substance and Sexual Activity  . Alcohol use: No    Alcohol/week: 0.0 oz  . Drug use: No  . Sexual activity: None  Other Topics Concern  . None  Social History Narrative   Lives alone.   Caffeine use: Drinks 1 cup coffee/day      Originally from [place]. Previously has lived in [place]. Prior travel to [place], [place], [place], [place], [place], [place], [place], & [place]. Previously worked in [employer] She has a dog currently. No bird, mold, or hot tub exposure. She also pastors a church.       Objective:   Physical Exam BP (!) 145/72 (BP Location: Left Arm, Cuff Size: Normal)   Pulse 72   Ht '5\' 3"'  (1.6 m)   Wt 207 lb (93.9 kg)   LMP  (LMP Unknown)   SpO2 94%   BMI 36.67 kg/m   General:  Awake. Obese. No distress.  Integument:  No rash. Warm. Dry. Extremities:  No cyanosis or clubbing.  HEENT:  Minimal nasal turbinate swelling. No oral ulcers. Moist mucous membranes Cardiovascular:  Regular rate. No edema. Regular rhythm.  Pulmonary:  Clear bilaterally with auscultation. No wheezing. Good aeration all the way to the bases. Normal work of breathing on room air. Abdomen: Soft. Normal bowel sounds. Protuberant. Musculoskeletal:  Normal bulk and tone. No joint deformity or effusion  appreciated. Neurological:  Cranial nerves 2-12 grossly in tact. No meningismus. Moving all 4 extremities equally.   PFT 12/23/16: FVC 1.44 L (72%) FEV1 0.96 L (63%) FEV1/FVC 0.67 FEF 25-75 0.55 L (42%) negative bronchodilator response TLC 3.85 L (78%) RV 102% DLCO uncorrected 45% 06/04/15: FVC 1.57 L (77%) FEV1 1.01 L (64%) FEV1/FVC 0.65 FEF 25-75 0.54 L (39%) negative bronchodilator response 02/21/15: FVC 1.60 L (78%) FEV1 1.03 L (65%) FEV1/FVC 0.64 FEF 25-75 0.51 L (37%) positive bronchodilator response 11/20/14: FVC 1.44 L (70%) FEV1 0.91 L (57%) FEV1/FVC 0.63 FEF 25-75 0.47 L (33%) positive bronchodilator response TLC 3.92 L (79%) RV 103% DLCO uncorrected 64% 11/12/13: FVC 1.57 L (74%) FEV1 1.09 L (67%) FEV1/FVC 0.70 FEF 25-75 0.72 L (50%) negative bronchodilator response 08/04/11: FVC 1.61 L (60%) FEV1 0.89 L (48%) FEV1/FVC 0.56 FEF 25-75 0.29 L (14%) positive bronchodilator response TLC 3.67 L (80%) RV 103% ERV 46% DLCO corrected 38%  6MWT 11/20/14:  Walked 336 meters / Baseline Sat 100% on RA / Nadir Sat 99% on RA (pt c/o pain in her right neck w/ dyspnea)  IMAGING CXR PA/LAT 03/13/15 (previously reviewed by me): No new nodule  or opacity appreciated. No pleural effusion. Mild hyperinflation with flattening of the diaphragms. Heart normal in size. Mediastinum normal in contour.  BARIUM SWALLOW 10/15/14 (per radiologist): Schatzki ring distal esophagus maximum diameter 1.2 cm with barium tablet impacting at this ring.  CXR PA/LAT 02/13/14 (previously reviewed by me):  Sternotomy wires from prior thoracic surgery noted. Heart normal in size. No pleural effusion appreciated. No parenchymal nodule or opacification appreciated. Mild flattening of the diaphragms bilaterally.  CARDIAC LHC (12/14/16):  Prox LAD lesion is 80% stenosed. LIMA to LAD is patent.  Prox RCA lesion is 25% stenosed. SVG to PDA is occluded.  Mid RCA lesion is 25% stenosed.  Circumflex is a large tortuous vessel.  Ost  1st Diag lesion is 100% stenosed. SVG to diagonal is patent.  The left ventricular systolic function is normal.  LV end diastolic pressure is normal.  The left ventricular ejection fraction is 55-65% by visual estimate.  There is no aortic valve stenosis.  TTE (10/06/11): LV normal in size. Normal regional wall motion. EF 60-65%. Grade 1 diastolic dysfunction. LA & RA normal in size. 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 RV normal in size and function. RVSP 19 mmHg. No aortic stenosis or regurgitation. No mitral stenosis or regurgitation. No pulmonic stenosis. Trivial tricuspid regurgitation. No pericardial effusion.  LABS 03/13/15 Procalcitonin:  <0.1 CBC: 10.9/11.8/37.3/245  10/08/14 IgG: 1060 IgM: 112 IgA: 272 IgE: 5 CBC: 6.1/11.3/36.0/293 Differential: Eos 0.3 (4.5%)  RAST panel: Negative Aspergillus antigen: <0.1  1/26/9 ABG:  7.39/44/113    Assessment & Plan:  76 y.o. female with recent syncopal event. I reviewed her left heart catheterization with her today. It's unclear what precipitated her syncope. She is attributing some of her new symptoms to her recent statin therapy. She has had increased wheezing and symptoms that could be consistent with worsening of her underlying asthma. The patient has not been able to afford additional medications in the past and currently given financial constraints. She has been repeatedly turned down by our ACO for assistance with her medications. Certainly an arrhythmia could have precipitated her syncope. I am providing her with additional samples of asthma controller medication that may help to provide some relief and also arranging close follow-up with our office to ensure she experiences some clinical improvement. I instructed the patient to seek immediate medical attention if she has any other new symptoms and contact my office if she has any questions or concerns before her next appointment.  1. Syncopal episode: Advised her to seek  immediate medical attention if she experiences any other new symptoms or near syncope. I have messaged her cardiologist to make him aware of this event. 2. Severe, persistent asthma with fixed airway obstruction: Patient given samples of Symbicort 160/4.5. Instructed use albuterol inhaler as needed. 3. GERD: Controlled with Prilosec. No new medications. 4. Chronic allergic rhinitis: Continuing Singulair. No changes. 5. Health maintenance: Status post Influenza October 2018, Pneumovax January 2006 & Prevnar 21 August 2015. 6. Follow-up: Return to clinic in 2 weeks.  Sonia Baller Ashok Cordia, M.D. [facility] Pager:  [phone] After 3pm or if no response, call [phone] 2:16 PM 12/23/16

## 2016-12-23 NOTE — Progress Notes (Signed)
PFT done today. 

## 2016-12-23 NOTE — Telephone Encounter (Signed)
Returned call to pt - states she has cramping in her calves and legs. Symptoms appeared after starting Repatha and have worsened with each subsequent injection. She is also intolerant to atorvastatin 40mg  daily, rosuvastatin unknown dose, and Welchol (full body myalgias with all). Discussed rechallenging with statins vs clinical trial and pt is willing to try pravastatin 20mg  HS. Rx called in and advised pt to call clinic with any side effects. If so, will try to pursues CLEAR clinical trial.

## 2016-12-27 ENCOUNTER — Telehealth: Payer: Self-pay | Admitting: Internal Medicine

## 2016-12-27 ENCOUNTER — Telehealth: Payer: Self-pay | Admitting: Nurse Practitioner

## 2016-12-27 DIAGNOSIS — R55 Syncope and collapse: Secondary | ICD-10-CM

## 2016-12-27 NOTE — Telephone Encounter (Signed)
Tried contacting pt, unable to LVM 

## 2016-12-27 NOTE — Telephone Encounter (Signed)
Spoke with patient and reviewed Dr. Elmarie Shiley advice that we will order a 30 day monitor. I advised that someone from our office will call her to schedule. She verbalized understanding and agreement and thanked me for the call.

## 2016-12-27 NOTE — Telephone Encounter (Signed)
Her alkaline phosphatase is elevated but her other liver enzymes are normal - this may be more representative of her bones.  She should make sure she is exercising and taking calcium and vitamin d.   She is due for a few health maintenance items, including a bone density -- I would like her to schedule a wellness visit with Sharee Pimple to help get these things scheduled.

## 2016-12-27 NOTE — Telephone Encounter (Signed)
-----   Message from Thayer Headings, MD sent at 12/23/2016  7:06 PM EST ----- Thanks Creig Hines,  We can get a 30 day monitor to further evaluate her symptoms  Phil  ----- Message ----- From: Javier Glazier, MD Sent: 12/23/2016   2:32 PM To: Thayer Headings, MD  FYI. 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 I reviewed the recent LHC. Not sure what precipitated this. No clear symptoms to suggest an arrhythmia but she is worried. I'm going to get her some samples to see what I can do to help with the asthma/COPD side. They have consistently turned her down for [facility] and she is still having difficulty affording all of her medication.  J

## 2016-12-27 NOTE — Telephone Encounter (Signed)
Pt called and she had labs done on 11/14, she was informed she has elevated enzymes and was told to call us, Please advise and call back in regard

## 2016-12-28 ENCOUNTER — Ambulatory Visit (AMBULATORY_SURGERY_CENTER): Payer: Self-pay | Admitting: *Deleted

## 2016-12-28 ENCOUNTER — Other Ambulatory Visit: Payer: Self-pay | Admitting: Emergency Medicine

## 2016-12-28 ENCOUNTER — Other Ambulatory Visit: Payer: Self-pay

## 2016-12-28 VITALS — Ht 62.0 in | Wt 211.0 lb

## 2016-12-28 DIAGNOSIS — R1310 Dysphagia, unspecified: Secondary | ICD-10-CM

## 2016-12-28 DIAGNOSIS — R1319 Other dysphagia: Secondary | ICD-10-CM

## 2016-12-28 MED ORDER — TRAZODONE HCL 50 MG PO TABS: 50.0000 mg | ORAL_TABLET | Freq: Every evening | ORAL | 0 refills | Status: DC | PRN

## 2016-12-28 NOTE — Progress Notes (Signed)
No egg or soy allergy known to patient  Some  issues with past sedation with any surgeries  or procedures, no intubation problems some nausea No diet pills per patient No home 02 use per patient  Not now last used at home in January of 2017 blood thinners per patient on Plavix Pt denies issues with constipation  No A fib or A flutter  EMMI video sent to pt's e mail

## 2016-12-29 ENCOUNTER — Encounter: Payer: Self-pay | Admitting: Gastroenterology

## 2016-12-29 NOTE — Telephone Encounter (Signed)
Spoke with pt to inform. Transferred pt to [name] to schedule a wellness visit.

## 2016-12-30 ENCOUNTER — Other Ambulatory Visit: Payer: Self-pay | Admitting: Neurology

## 2017-01-04 ENCOUNTER — Emergency Department (HOSPITAL_COMMUNITY): Payer: Medicare Other

## 2017-01-04 ENCOUNTER — Observation Stay (HOSPITAL_COMMUNITY)
Admission: EM | Admit: 2017-01-04 | Discharge: 2017-01-06 | Disposition: A | Discharge status: Home / Self Care | Payer: Medicare Other | Attending: Internal Medicine | Admitting: Internal Medicine

## 2017-01-04 ENCOUNTER — Encounter (HOSPITAL_COMMUNITY): Payer: Self-pay | Admitting: Emergency Medicine

## 2017-01-04 ENCOUNTER — Other Ambulatory Visit: Payer: Self-pay

## 2017-01-04 DIAGNOSIS — I209 Angina pectoris, unspecified: Principal | ICD-10-CM | POA: Insufficient documentation

## 2017-01-04 DIAGNOSIS — Z79899 Other long term (current) drug therapy: Secondary | ICD-10-CM | POA: Diagnosis not present

## 2017-01-04 DIAGNOSIS — E538 Deficiency of other specified B group vitamins: Secondary | ICD-10-CM

## 2017-01-04 DIAGNOSIS — R0602 Shortness of breath: Secondary | ICD-10-CM | POA: Diagnosis present

## 2017-01-04 DIAGNOSIS — Z794 Long term (current) use of insulin: Secondary | ICD-10-CM | POA: Diagnosis not present

## 2017-01-04 DIAGNOSIS — Z951 Presence of aortocoronary bypass graft: Secondary | ICD-10-CM | POA: Diagnosis not present

## 2017-01-04 DIAGNOSIS — Z96651 Presence of right artificial knee joint: Secondary | ICD-10-CM | POA: Insufficient documentation

## 2017-01-04 DIAGNOSIS — E109 Type 1 diabetes mellitus without complications: Secondary | ICD-10-CM | POA: Diagnosis not present

## 2017-01-04 DIAGNOSIS — R079 Chest pain, unspecified: Secondary | ICD-10-CM | POA: Diagnosis present

## 2017-01-04 DIAGNOSIS — Z7982 Long term (current) use of aspirin: Secondary | ICD-10-CM | POA: Insufficient documentation

## 2017-01-04 DIAGNOSIS — J45909 Unspecified asthma, uncomplicated: Secondary | ICD-10-CM | POA: Diagnosis not present

## 2017-01-04 DIAGNOSIS — I10 Essential (primary) hypertension: Secondary | ICD-10-CM | POA: Insufficient documentation

## 2017-01-04 DIAGNOSIS — I2510 Atherosclerotic heart disease of native coronary artery without angina pectoris: Secondary | ICD-10-CM | POA: Diagnosis present

## 2017-01-04 DIAGNOSIS — Z87891 Personal history of nicotine dependence: Secondary | ICD-10-CM | POA: Diagnosis not present

## 2017-01-04 DIAGNOSIS — R0989 Other specified symptoms and signs involving the circulatory and respiratory systems: Secondary | ICD-10-CM | POA: Diagnosis present

## 2017-01-04 LAB — BRAIN NATRIURETIC PEPTIDE: B Natriuretic Peptide: 315 pg/mL — ABNORMAL HIGH (ref 0.0–100.0)

## 2017-01-04 LAB — I-STAT TROPONIN, ED: Troponin i, poc: 0 ng/mL (ref 0.00–0.08)

## 2017-01-04 LAB — D-DIMER, QUANTITATIVE: D-Dimer, Quant: 1.06 ug/mL-FEU — ABNORMAL HIGH (ref 0.00–0.50)

## 2017-01-04 LAB — CBC
HEMATOCRIT: 31.3 % — AB (ref 36.0–46.0)
HEMOGLOBIN: 9.3 g/dL — AB (ref 12.0–15.0)
MCH: 23.9 pg — AB (ref 26.0–34.0)
MCHC: 29.7 g/dL — AB (ref 30.0–36.0)
MCV: 80.5 fL (ref 78.0–100.0)
Platelets: 271 10*3/uL (ref 150–400)
RBC: 3.89 MIL/uL (ref 3.87–5.11)
RDW: 16.4 % — ABNORMAL HIGH (ref 11.5–15.5)
WBC: 6.1 10*3/uL (ref 4.0–10.5)

## 2017-01-04 LAB — BASIC METABOLIC PANEL
ANION GAP: 7 (ref 5–15)
BUN: 11 mg/dL (ref 6–20)
CALCIUM: 8.7 mg/dL — AB (ref 8.9–10.3)
CO2: 30 mmol/L (ref 22–32)
Chloride: 107 mmol/L (ref 101–111)
Creatinine, Ser: 0.71 mg/dL (ref 0.44–1.00)
GFR calc Af Amer: >60 mL/min (ref >60)
GFR calc non Af Amer: >60 mL/min (ref >60)
GLUCOSE: 85 mg/dL (ref 65–99)
POTASSIUM: 3.9 mmol/L (ref 3.5–5.1)
Sodium: 144 mmol/L (ref 135–145)

## 2017-01-04 MED ORDER — FUROSEMIDE 10 MG/ML IJ SOLN: 40.0000 mg | Freq: Once | INTRAMUSCULAR | Status: DC

## 2017-01-04 MED ORDER — IOPAMIDOL (ISOVUE-300) INJECTION 61%
INTRAVENOUS | Status: AC
  Administered 2017-01-04: 100 mL
  Filled 2017-01-04: qty 100

## 2017-01-04 MED ORDER — IOPAMIDOL (ISOVUE-370) INJECTION 76%
INTRAVENOUS | Status: AC
  Administered 2017-01-05: 100 mL
  Filled 2017-01-04: qty 100

## 2017-01-04 MED ORDER — NITROGLYCERIN 0.4 MG SL SUBL
0.4000 mg | SUBLINGUAL_TABLET | SUBLINGUAL | Status: DC | PRN
  Administered 2017-01-05: 0.4 mg via SUBLINGUAL
  Filled 2017-01-04: qty 1

## 2017-01-04 MED ORDER — NITROGLYCERIN 0.4 MG SL SUBL
0.4000 mg | SUBLINGUAL_TABLET | SUBLINGUAL | Status: DC | PRN
  Administered 2017-01-04: 0.4 mg via SUBLINGUAL
  Filled 2017-01-04: qty 1

## 2017-01-04 MED ORDER — ASPIRIN 81 MG PO CHEW
324.0000 mg | CHEWABLE_TABLET | Freq: Once | ORAL | Status: AC
  Administered 2017-01-04: 324 mg via ORAL
  Filled 2017-01-04: qty 4

## 2017-01-04 NOTE — ED Triage Notes (Addendum)
Cp x 2 weeks ago has had some sob  Got bad 2 days ago  Some sob with exertion and nausea took nitro yesterday has had NO meds today she states

## 2017-01-04 NOTE — ED Provider Notes (Signed)
[facility] EMERGENCY DEPARTMENT Provider Note   CSN: [id number] Arrival date & time: 01/04/17  1458     History   Chief Complaint Chief Complaint  Patient presents with  . Chest Pain  . Shortness of Breath    HPI [name] is a 76 y.o. female who presents with chest pain and SOB. PMH significant for CAD s/p CABG, asthma, insulin dependent DM, HTN, HLD, GERD, allergies. The patient has had intermittent chest pain with exertion for the past several weeks. She saw cardiology on 11/2 who recommended a cath which was done on 11/6. Cardiology recommended to continue aggressive medial therapy at that time and to take nitro since she had improvement with this. 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 Over the past 2 weeks the patient states she gets extremely SOB with any exertion. Her symptoms have become worse and constant since yesterday and did not improve with rest. She has sharp left sided chest pain which is constant and central chest pressure. The pain also radiates to her left neck and she reports lightheadedness. She developed nausea today which is a new symptom so she got scared and came to the ED. She did not take any medicines today. She also has bilateral leg swelling and it's slightly worse than normal. She denies fever, URI symptoms, cough, wheezing, palpitations, abodminal pain, vomiting. No recurrent syncope. EF by cath was 55-65%.   Cardiologist: Dr. Acie Fredrickson Pulmonologist: Dr. Ashok Cordia PCP: Dr. Quay Burow   HPI  Past Medical History:  Diagnosis Date  . Acute myocardial infarction, unspecified site, episode of care unspecified   . Adenomatous colon polyp   . Allergy   . Anxiety   . Asthma   . Chest pain   . COPD (chronic obstructive pulmonary disease) ([place])    pt is unsure if has been officially diagnosed  .  Coronary artery disease   . Diabetes mellitus   . GERD (gastroesophageal reflux disease)   . Hiatal hernia   . Hyperlipidemia   . Hypertension   . PONV (postoperative nausea and vomiting)   . Schatzki's ring   . Shoulder injury    resolved after shoulder surgery  . Sleep apnea    not on cpap  . Unspecified asthma(493.90)   . Upper respiratory symptom    02-13-14 "runny nose, mild cough,sniffles" -instructed to inform MD if symptoms worsens    Patient Active Problem List   Diagnosis Date Noted  . Insomnia 11/23/2016  . Chronic nonintractable headache 11/23/2016  . Pancreatic insufficiency 10/28/2016  . Carotid artery disease ([place]) 05/19/2016  . Fatigue 05/19/2016  . Anemia 03/27/2016  . Severe persistent asthma 03/18/2016  . Type 1 diabetes mellitus ([place]) 07/21/2015  . B12 deficiency 03/31/2015  . OSA (obstructive sleep apnea) 11/20/2014  . Fecal incontinence 08/28/2014  . 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 Falls 08/28/2014  . Neck pain of over 3 months duration 08/28/2014  . H/O total knee replacement 02/20/2014  . Obesity (BMI 30-39.9) 11/12/2013  . Chronic diastolic CHF (congestive heart failure) ([place]) 11/17/2012  . Meniscus, lateral, anterior horn derangement 11/10/2011  . Medial meniscus, posterior horn derangement 11/10/2011  . Osteoarthritis of right knee 11/10/2011  . Vertigo 10/05/2011  . HTN (hypertension), benign 10/05/2011  . Coronary artery disease 08/27/2010  . MUSCLE CRAMPS 12/29/2009  . History of myocardial infarction 11/19/2009  .  Extrinsic asthma 11/19/2009  . GERD 11/19/2009  . Hyperlipidemia 11/18/2009    Past Surgical History:  Procedure Laterality Date  . ABDOMINAL HYSTERECTOMY    . APPENDECTOMY     came out with Hysterectomy  . CARDIAC CATHETERIZATION  07/23/2009   EF 60%  . CARDIAC CATHETERIZATION  10/11/2008  . CARDIAC CATHETERIZATION  03/01/2007   EF 75-80%  . CARDIOVASCULAR STRESS TEST  11/15/2007   EF 60%  . COLONOSCOPY    .  CORONARY ARTERY BYPASS GRAFT     SEVERELY DISEASED SAPHENOUS VEIN GRAFT TO THE RIGHT CORONARY ARTERY BUT WITH FAIRLY WELL PRESERVED FLOW TO THE DISTAL RIGHT CORONARY ARTERY FROM THE NATIVE CIRCULATION-RESTART  CATH IN JUNE 2000, REVEALS MILD/MODERATE  CAD WITH GOOD FLOW DOWN HER LAD  . ESOPHAGOGASTRODUODENOSCOPY    . EYE SURGERY     bilateral cataract surgery with lens implant  . LEFT HEART CATH AND CORS/GRAFTS ANGIOGRAPHY N/A 12/14/2016   Procedure: LEFT HEART CATH AND CORS/GRAFTS ANGIOGRAPHY;  Surgeon: Jettie Booze, MD;  Location: [facility];  Service: Cardiovascular;  Laterality: N/A;  . lense removal Left   . POLYPECTOMY    . ROTATOR CUFF REPAIR     right and left  . TONSILLECTOMY     age 66  . 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 tumor removed kidney    . UPPER GASTROINTESTINAL ENDOSCOPY    . US ECHOCARDIOGRAPHY  03/08/2008   EF 55-60%    OB History    No data available       Home Medications    Prior to Admission medications   Medication Sig Start Date End Date Taking? Authorizing Provider  albuterol (PROVENTIL HFA;VENTOLIN HFA) 108 (90 BASE) MCG/ACT inhaler Inhale 2 puffs into the lungs every 6 (six) hours as needed. For shortness of breath. 03/06/12   Elsie Stain, MD  albuterol (PROVENTIL) (2.5 MG/3ML) 0.083% nebulizer solution Take 3 mLs (2.5 mg total) by nebulization every 6 (six) hours as needed for wheezing or shortness of breath. 08/30/16   Magdalen Spatz, NP  alum & mag hydroxide-simeth (MAALOX/MYLANTA) 200-200-20 MG/5ML suspension Take 30 mLs as needed by mouth for indigestion or heartburn.    [provider]  aspirin EC 81 MG tablet Take 1 tablet (81 mg total) by mouth daily. 12/10/16   Richardson Dopp T, PA-C  baclofen (LIORESAL) 20 MG tablet Take 20 mg 2 (two) times daily by mouth.    [provider]  benzonatate (TESSALON) 100 MG capsule Take 100 mg by mouth 2 (two) times daily as needed for cough.     [provider]  Blood Glucose Monitoring Suppl (ONE TOUCH ULTRA MINI) w/Device KIT 3 (three) times daily. for testing 02/26/15   [provider]  budesonide-formoterol (SYMBICORT) 160-4.5 MCG/ACT inhaler Inhale 2 puffs into the lungs 2 (two) times daily. Patient taking differently: Inhale 2 puffs 2 (two) times daily as needed into the lungs (shortness of breath).  08/13/16   Javier Glazier, MD  calcium carbonate (TUMS - DOSED IN MG ELEMENTAL CALCIUM) 500 MG chewable tablet Chew 2 tablets daily as needed by mouth for indigestion or heartburn.    [provider]  Cholecalciferol (VITAMIN D) 2000 UNITS tablet Take 2,000 Units by mouth daily.     [provider]  clonazePAM (KLONOPIN) 0.5 MG tablet Take 0.5  mg daily by mouth.    [provider]  clopidogrel (PLAVIX) 75 MG tablet Take 1 tablet (75 mg total) by mouth daily. 12/10/16 12/10/17  Richardson Dopp T, PA-C  Cyanocobalamin (B-12 COMPLIANCE INJECTION) 1000 MCG/ML KIT Inject 1,000 mcg every 30 (thirty) days as directed.    [provider]  cyclobenzaprine (FLEXERIL) 5 MG tablet TAKE 1 TABLET BY MOUTH AT BEDTIME 01/03/17   Melvenia Beam, MD  diclofenac sodium (VOLTAREN) 1 % GEL Apply 2 g topically every 4 (four) hours. Patient taking differently: Apply 2 g every 4 (four) hours as needed topically (muscle pain).  03/20/16   Debbe Odea, MD  diltiazem (CARDIZEM CD) 240 MG 24 hr capsule Take 1 capsule (240 mg total) by mouth daily. 08/13/14   Nahser, Wonda Cheng, MD  fexofenadine (ALLEGRA) 180 MG tablet Take 1 tablet (180 mg total) by mouth daily. 09/04/15   Javier Glazier, MD  furosemide (LASIX) 40 MG tablet Take 40 mg every other day by mouth.     [provider]  gabapentin (NEURONTIN) 100 MG capsule Take 300 mg at night 11/23/16   Burns, Claudina Lick, MD  insulin lispro  protamine-insulin lispro (HUMALOG 75/25) (75-25) 100 UNIT/ML SUSP Inject 20-30 Units into the skin 2 (two) times daily with a meal. Takes 20 units in the morning and 30  units with supper    [provider]  isosorbide mononitrate (IMDUR) 60 MG 24 hr tablet Take 1.5 tablets (90 mg total) by mouth daily. 12/10/16 03/10/17  Richardson Dopp T, PA-C  loperamide (IMODIUM) 2 MG capsule Take 2 mg by mouth as needed for diarrhea or loose stools.     [provider]  meclizine (ANTIVERT) 25 MG tablet Take 25 mg by mouth 2 (two) times daily as needed for dizziness.     [provider]  methocarbamol (ROBAXIN) 500 MG tablet Take 500 mg 2 (two) times daily by mouth.    [provider]  metoprolol succinate (TOPROL-XL) 50 MG 24 hr tablet Take 1 tablet (50 mg total) by mouth daily. 03/25/16   Nahser, Wonda Cheng, MD  montelukast (SINGULAIR) 5 MG chewable tablet Chew 1 tablet (5 mg total) by mouth at bedtime. Patient taking differently: Chew 5 mg at bedtime as needed by mouth (allergies).  10/08/14   Javier Glazier, MD  Multiple Vitamin (MULTIVITAMIN WITH MINERALS) TABS Take 1 tablet by mouth daily.    [provider]  Nebulizers (COMPRESSOR/NEBULIZER) MISC Use with albuterol 04/24/14   Elsie Stain, MD  nitroGLYCERIN (NITROSTAT) 0.4 MG SL tablet Place 1 tablet (0.4 mg total) under the tongue every 5 (five) minutes as needed. Patient taking differently: Place 0.4 mg every 5 (five) minutes as needed under the tongue for chest pain.  12/10/16   Richardson Dopp T, PA-C  omeprazole (PRILOSEC) 40 MG capsule Take 40 mg 2 (two) times daily by mouth.    [provider]  ONE TOUCH ULTRA TEST test strip CHECK BLOOD SUGAR TWICE A DAY DX: E11.9 03/26/15   [provider]  [facility] DELICA LANCETS 28B MISC 3 (three) times daily. for testing 02/26/15   [provider]  Pancrelipase, Lip-Prot-Amyl, 24000 units CPEP Take 2 capsules (48,000 Units total) by mouth 3  (three) times daily with meals. Take 1 capsule with a snack 08/27/15   Ladene Artist, MD  pantoprazole (PROTONIX) 40 MG tablet Take 1 tablet (40 mg total) by mouth 2 (two) times daily before a meal. Patient not taking:  Reported on 12/28/2016 12/10/16   Richardson Dopp T, PA-C  potassium chloride SA (K-DUR,KLOR-CON) 20 MEQ tablet Take 20 mEq daily by mouth.     [provider]  pravastatin (PRAVACHOL) 20 MG tablet Take 1 tablet (20 mg total) every evening by mouth. Patient not taking: Reported on 12/28/2016 12/23/16 03/23/17  Nahser, Wonda Cheng, MD  Spacer/Aero-Holding Chambers (AEROCHAMBER MV) inhaler Use as instructed 08/13/16   Javier Glazier, MD  traZODone (DESYREL) 50 MG tablet Take 1-2 tablets (50-100 mg total) at bedtime as needed by mouth for sleep. 12/28/16   Binnie Rail, MD    Family History Family History  Problem Relation Age of Onset  . Heart disease Maternal Grandfather   . Heart failure Maternal Grandfather   . Diabetes Maternal Grandfather   . Heart attack Father   . Diabetes Mother   . Rheum arthritis Sister   . Emphysema Paternal Uncle   . Esophageal cancer Brother 16       she said he was born with it  . Emphysema Paternal Aunt   . Neuropathy Neg Hx   . Multiple sclerosis Neg Hx   . Colon cancer Neg Hx   . Colon polyps Neg Hx   . Rectal cancer Neg Hx   . Stomach cancer Neg Hx     Social History Social History   Tobacco Use  . Smoking status: Former Smoker    Packs/day: 0.50    Years: 21.00    Pack years: 10.50    Types: Cigarettes    Start date: 02/09/1955    Last attempt to quit: 02/09/1976    Years since quitting: 40.9  . Smokeless tobacco: Never Used  Substance Use Topics  . Alcohol use: No    Alcohol/week: 0.0 oz  . Drug use: No     Allergies   Amlodipine; Banana; Co q10 [coenzyme q10]; Codeine; Morphine; Repatha [evolocumab]; Statins; and Sulfonamide derivatives   Review of Systems Review of Systems  Constitutional: Negative for  chills and fever.  HENT: Negative for rhinorrhea.   Respiratory: Positive for shortness of breath. Negative for cough and wheezing.   Cardiovascular: Positive for chest pain and leg swelling. Negative for palpitations.  Gastrointestinal: Positive for nausea. Negative for abdominal pain and vomiting.  Musculoskeletal: Positive for neck pain.  Neurological: Positive for light-headedness and headaches. Negative for syncope.  All other systems reviewed and are negative.    Physical Exam Updated Vital Signs BP (!) 146/80   Pulse 71   Temp 98 F (36.7 C) (Oral)   Resp 15   LMP  (LMP Unknown)   SpO2 98%   Physical Exam  Constitutional: She is oriented to person, place, and time. She appears well-developed and well-nourished. No distress.  SOB with talking  HENT:  Head: Normocephalic and atraumatic.  Eyes: Conjunctivae are normal. Pupils are equal, round, and reactive to light. Right eye exhibits no discharge. Left eye exhibits no discharge. No scleral icterus.  Neck: Normal range of motion. JVD present.  Cardiovascular: Normal rate and regular rhythm. Exam reveals no gallop and no friction rub.  No murmur heard. No carotid bruits  Pulmonary/Chest: Effort normal and breath sounds normal. No stridor. No respiratory distress. She has no wheezes. She has no rales. She exhibits tenderness (reproducible tenderness).  Bypass scar  Abdominal: Soft. Bowel sounds are normal. She exhibits no distension. There is no tenderness.  Musculoskeletal:  Trace bilateral edema  Neurological: She is alert and oriented to person, place, and time.  Skin: Skin is warm and dry.  Psychiatric: She has a normal mood and affect. Her behavior is normal.  Nursing note and vitals reviewed.    ED Treatments / Results  Labs (all labs ordered are listed, but only abnormal results are displayed) Labs Reviewed  BASIC METABOLIC PANEL - Abnormal; Notable for the following components:      Result Value   Calcium  8.7 (*)    All other components within normal limits  CBC - Abnormal; Notable for the following components:   Hemoglobin 9.3 (*)    HCT 31.3 (*)    MCH 23.9 (*)    MCHC 29.7 (*)    RDW 16.4 (*)    All other components within normal limits  BRAIN NATRIURETIC PEPTIDE - Abnormal; Notable for the following components:   B Natriuretic Peptide 315.0 (*)    All other components within normal limits  D-DIMER, QUANTITATIVE (NOT AT [facility]) - Abnormal; Notable for the following components:   D-Dimer, Quant 1.06 (*)    All other components within normal limits  TROPONIN I  TROPONIN I  TROPONIN I  TROPONIN I  I-STAT TROPONIN, ED    EKG  EKG Interpretation  Date/Time:  Tuesday January 04 2017 15:05:16 EST Ventricular Rate:  69 PR Interval:  110 QRS Duration: 138 QT Interval:  456 QTC Calculation: 488 R Axis:   47 Text Interpretation:  Sinus rhythm with short PR Right bundle branch block Abnormal ECG similar to previous Confirmed by Theotis Burrow [phone]) on 01/04/2017 7:00:32 PM       Radiology Dg Chest 2 View  Result Date: 01/04/2017 CLINICAL DATA:  LEFT chest pain radiating to LEFT side. EXAM: CHEST  2 VIEW COMPARISON:  08/30/2016 FINDINGS: Sternotomy wires overlie normal cardiac silhouette. No effusion, infiltrate pneumothorax. Chronic bronchitic markings noted. RIGHT shoulder prosthetic. IMPRESSION: No acute cardiopulmonary process. Electronically Signed   By: Suzy Bouchard M.D.   On: 01/04/2017 15:44    Procedures Procedures (including critical care time)  Medications Ordered in ED Medications  furosemide (LASIX) injection 40 mg (not administered)  nitroGLYCERIN (NITROSTAT) SL tablet 0.4 mg (not administered)  aspirin chewable tablet 324 mg (324 mg Oral Given 01/04/17 2052)  iopamidol (ISOVUE-300) 61 % injection (100 mLs  Contrast Given 01/04/17 2301)  iopamidol (ISOVUE-370) 76 % injection (100 mLs  Contrast Given 01/05/17 0001)    Initial Impression / Assessment and  Plan / ED Course  I have reviewed the triage vital signs and the nursing notes.  Pertinent labs & imaging results that were available during my care of the patient were reviewed by me and considered in my medical decision making (see chart for details).  76 year old female presents with exertional chest pain and SOB. She is hypertensive but otherwise vitals are normal. CBC is remarkable for anemia. BMP is normal. Initial trop is normal. BNP is 315. D dimer elevated CT chest is negative for PE or edema. Unclear etiology of pain. She has typical and atypical features. It is exertional and relieved with nitro but it is also reproducible and cardiac work up is reassuring. Shared visit with Dr. Rex Kras. I did speak with cardiology and they recommend to bring her in to optimize her medicine since she is so symptomatic. Spoke with Dr. Alcario Drought who will admit.   Of note - a CT of the abdomen and pelvis was ordered by accident and unfortunately the patient did have this test done before it was caught. I notified my attending and charge  RN Marlynn Perking. A safety zone report was filed and the patient was notified of the error as well.    Final Clinical Impressions(s) / ED Diagnoses   Final diagnoses:  Chest pain, unspecified type    ED Discharge Orders    None       Recardo Evangelist, PA-C 01/05/17 Branch, Wenda Overland, MD 01/12/17 2045

## 2017-01-05 ENCOUNTER — Emergency Department (HOSPITAL_COMMUNITY): Payer: Medicare Other

## 2017-01-05 ENCOUNTER — Encounter (HOSPITAL_COMMUNITY): Payer: Self-pay | Admitting: Cardiology

## 2017-01-05 ENCOUNTER — Other Ambulatory Visit (HOSPITAL_COMMUNITY): Payer: Self-pay

## 2017-01-05 DIAGNOSIS — R079 Chest pain, unspecified: Secondary | ICD-10-CM | POA: Diagnosis present

## 2017-01-05 DIAGNOSIS — I209 Angina pectoris, unspecified: Secondary | ICD-10-CM | POA: Diagnosis not present

## 2017-01-05 DIAGNOSIS — E109 Type 1 diabetes mellitus without complications: Secondary | ICD-10-CM

## 2017-01-05 DIAGNOSIS — E1059 Type 1 diabetes mellitus with other circulatory complications: Secondary | ICD-10-CM | POA: Diagnosis not present

## 2017-01-05 DIAGNOSIS — I25709 Atherosclerosis of coronary artery bypass graft(s), unspecified, with unspecified angina pectoris: Secondary | ICD-10-CM | POA: Diagnosis not present

## 2017-01-05 DIAGNOSIS — I10 Essential (primary) hypertension: Secondary | ICD-10-CM | POA: Diagnosis not present

## 2017-01-05 DIAGNOSIS — R55 Syncope and collapse: Secondary | ICD-10-CM

## 2017-01-05 LAB — TROPONIN I
Troponin I: <0.03 ng/mL (ref <0.03)
Troponin I: <0.03 ng/mL (ref <0.03)
Troponin I: <0.03 ng/mL (ref <0.03)
Troponin I: <0.03 ng/mL (ref <0.03)

## 2017-01-05 LAB — GLUCOSE, CAPILLARY: Glucose-Capillary: 137 mg/dL — ABNORMAL HIGH (ref 65–99)

## 2017-01-05 MED ORDER — FLUTICASONE FUROATE-VILANTEROL 200-25 MCG/INH IN AEPB
1.0000 | INHALATION_SPRAY | Freq: Every day | RESPIRATORY_TRACT | Status: DC
  Administered 2017-01-05 – 2017-01-06 (×2): 1 via RESPIRATORY_TRACT
  Filled 2017-01-05 (×2): qty 28

## 2017-01-05 MED ORDER — ONDANSETRON HCL 4 MG/2ML IJ SOLN: 4.0000 mg | Freq: Four times a day (QID) | INTRAMUSCULAR | Status: DC | PRN

## 2017-01-05 MED ORDER — ISOSORBIDE MONONITRATE ER 30 MG PO TB24
90.0000 mg | ORAL_TABLET | Freq: Every day | ORAL | Status: DC
  Administered 2017-01-05 – 2017-01-06 (×2): 90 mg via ORAL
  Filled 2017-01-05: qty 1
  Filled 2017-01-05: qty 3

## 2017-01-05 MED ORDER — TRAZODONE HCL 50 MG PO TABS
50.0000 mg | ORAL_TABLET | Freq: Every evening | ORAL | Status: DC | PRN
  Administered 2017-01-05 (×2): 100 mg via ORAL
  Filled 2017-01-05 (×2): qty 2

## 2017-01-05 MED ORDER — ASPIRIN EC 81 MG PO TBEC
81.0000 mg | DELAYED_RELEASE_TABLET | Freq: Every day | ORAL | Status: DC
  Administered 2017-01-05 – 2017-01-06 (×2): 81 mg via ORAL
  Filled 2017-01-05 (×2): qty 1

## 2017-01-05 MED ORDER — ENOXAPARIN SODIUM 40 MG/0.4ML ~~LOC~~ SOLN
40.0000 mg | Freq: Every day | SUBCUTANEOUS | Status: DC
  Filled 2017-01-05 (×2): qty 0.4

## 2017-01-05 MED ORDER — MONTELUKAST SODIUM 5 MG PO CHEW
5.0000 mg | CHEWABLE_TABLET | Freq: Every day | ORAL | Status: DC
  Filled 2017-01-05 (×3): qty 1

## 2017-01-05 MED ORDER — ACETAMINOPHEN 325 MG PO TABS: 650.0000 mg | ORAL_TABLET | ORAL | Status: DC | PRN

## 2017-01-05 MED ORDER — ALBUTEROL SULFATE HFA 108 (90 BASE) MCG/ACT IN AERS: 2.0000 | INHALATION_SPRAY | Freq: Four times a day (QID) | RESPIRATORY_TRACT | Status: DC | PRN

## 2017-01-05 MED ORDER — PANCRELIPASE (LIP-PROT-AMYL) 12000-38000 UNITS PO CPEP
24000.0000 [IU] | ORAL_CAPSULE | ORAL | Status: DC | PRN
  Filled 2017-01-05: qty 2

## 2017-01-05 MED ORDER — PANTOPRAZOLE SODIUM 40 MG PO TBEC
40.0000 mg | DELAYED_RELEASE_TABLET | Freq: Two times a day (BID) | ORAL | Status: DC
  Administered 2017-01-05: 40 mg via ORAL
  Filled 2017-01-05 (×2): qty 1

## 2017-01-05 MED ORDER — FUROSEMIDE 40 MG PO TABS
40.0000 mg | ORAL_TABLET | ORAL | Status: DC
  Administered 2017-01-05: 40 mg via ORAL
  Filled 2017-01-05: qty 1
  Filled 2017-01-05: qty 2

## 2017-01-05 MED ORDER — DICLOFENAC SODIUM 1 % TD GEL
4.0000 g | Freq: Four times a day (QID) | TRANSDERMAL | Status: DC
  Administered 2017-01-05 – 2017-01-06 (×5): 4 g via TOPICAL
  Filled 2017-01-05: qty 100

## 2017-01-05 MED ORDER — VITAMIN D 1000 UNITS PO TABS
2000.0000 [IU] | ORAL_TABLET | Freq: Every day | ORAL | Status: DC
  Administered 2017-01-05 – 2017-01-06 (×2): 2000 [IU] via ORAL
  Filled 2017-01-05 (×2): qty 2

## 2017-01-05 MED ORDER — DILTIAZEM HCL ER COATED BEADS 240 MG PO CP24
240.0000 mg | ORAL_CAPSULE | Freq: Every day | ORAL | Status: DC
Start: 2017-01-05 — End: 2017-01-06
  Administered 2017-01-05 – 2017-01-06 (×2): 240 mg via ORAL
  Filled 2017-01-05 (×3): qty 1

## 2017-01-05 MED ORDER — ADULT MULTIVITAMIN W/MINERALS CH
1.0000 | ORAL_TABLET | Freq: Every day | ORAL | Status: DC
  Administered 2017-01-05 – 2017-01-06 (×2): 1 via ORAL
  Filled 2017-01-05 (×2): qty 1

## 2017-01-05 MED ORDER — BENZONATATE 100 MG PO CAPS: 100.0000 mg | ORAL_CAPSULE | Freq: Two times a day (BID) | ORAL | Status: DC | PRN

## 2017-01-05 MED ORDER — INSULIN ASPART PROT & ASPART (70-30 MIX) 100 UNIT/ML ~~LOC~~ SUSP: 20.0000 [IU] | Freq: Two times a day (BID) | SUBCUTANEOUS | Status: DC

## 2017-01-05 MED ORDER — CLOPIDOGREL BISULFATE 75 MG PO TABS
75.0000 mg | ORAL_TABLET | Freq: Every day | ORAL | Status: DC
  Administered 2017-01-05 – 2017-01-06 (×2): 75 mg via ORAL
  Filled 2017-01-05 (×2): qty 1

## 2017-01-05 MED ORDER — NITROGLYCERIN 0.4 MG SL SUBL: 0.4000 mg | SUBLINGUAL_TABLET | SUBLINGUAL | Status: DC | PRN

## 2017-01-05 MED ORDER — CALCIUM CARBONATE ANTACID 500 MG PO CHEW
2.0000 | CHEWABLE_TABLET | Freq: Every day | ORAL | Status: DC | PRN
  Filled 2017-01-05: qty 2

## 2017-01-05 MED ORDER — INSULIN ASPART PROT & ASPART (70-30 MIX) 100 UNIT/ML ~~LOC~~ SUSP
20.0000 [IU] | Freq: Every day | SUBCUTANEOUS | Status: DC
  Administered 2017-01-05 – 2017-01-06 (×2): 20 [IU] via SUBCUTANEOUS
  Filled 2017-01-05 (×3): qty 10

## 2017-01-05 MED ORDER — PRAVASTATIN SODIUM 20 MG PO TABS
20.0000 mg | ORAL_TABLET | Freq: Every evening | ORAL | Status: DC
  Administered 2017-01-05: 20 mg via ORAL
  Filled 2017-01-05: qty 1

## 2017-01-05 MED ORDER — ALBUTEROL SULFATE (2.5 MG/3ML) 0.083% IN NEBU: 2.5000 mg | INHALATION_SOLUTION | Freq: Four times a day (QID) | RESPIRATORY_TRACT | Status: DC | PRN

## 2017-01-05 MED ORDER — CLONAZEPAM 0.5 MG PO TABS
0.5000 mg | ORAL_TABLET | Freq: Every day | ORAL | Status: DC
  Administered 2017-01-05 – 2017-01-06 (×2): 0.5 mg via ORAL
  Filled 2017-01-05 (×2): qty 1

## 2017-01-05 MED ORDER — POTASSIUM CHLORIDE CRYS ER 20 MEQ PO TBCR
20.0000 meq | EXTENDED_RELEASE_TABLET | Freq: Every day | ORAL | Status: DC
  Administered 2017-01-05 – 2017-01-06 (×2): 20 meq via ORAL
  Filled 2017-01-05 (×2): qty 1

## 2017-01-05 MED ORDER — METHOCARBAMOL 500 MG PO TABS
500.0000 mg | ORAL_TABLET | Freq: Two times a day (BID) | ORAL | Status: DC
  Administered 2017-01-05 – 2017-01-06 (×4): 500 mg via ORAL
  Filled 2017-01-05 (×4): qty 1

## 2017-01-05 MED ORDER — METOPROLOL SUCCINATE ER 50 MG PO TB24
50.0000 mg | ORAL_TABLET | Freq: Every day | ORAL | Status: DC
  Administered 2017-01-05 – 2017-01-06 (×2): 50 mg via ORAL
  Filled 2017-01-05 (×2): qty 1

## 2017-01-05 MED ORDER — CYCLOBENZAPRINE HCL 10 MG PO TABS
5.0000 mg | ORAL_TABLET | Freq: Every day | ORAL | Status: DC
  Administered 2017-01-05 (×2): 5 mg via ORAL
  Filled 2017-01-05 (×2): qty 1

## 2017-01-05 MED ORDER — DICLOFENAC SODIUM 1 % TD GEL: 2.0000 g | TRANSDERMAL | Status: DC | PRN

## 2017-01-05 MED ORDER — INSULIN ASPART PROT & ASPART (70-30 MIX) 100 UNIT/ML ~~LOC~~ SUSP: 30.0000 [IU] | Freq: Every day | SUBCUTANEOUS | Status: DC

## 2017-01-05 MED ORDER — PANCRELIPASE (LIP-PROT-AMYL) 12000-38000 UNITS PO CPEP
48000.0000 [IU] | ORAL_CAPSULE | Freq: Three times a day (TID) | ORAL | Status: DC
  Administered 2017-01-05 – 2017-01-06 (×5): 48000 [IU] via ORAL
  Filled 2017-01-05 (×7): qty 4

## 2017-01-05 MED ORDER — GABAPENTIN 300 MG PO CAPS
300.0000 mg | ORAL_CAPSULE | Freq: Every day | ORAL | Status: DC
  Administered 2017-01-05 (×2): 300 mg via ORAL
  Filled 2017-01-05 (×2): qty 1

## 2017-01-05 MED ORDER — BACLOFEN 20 MG PO TABS
20.0000 mg | ORAL_TABLET | Freq: Two times a day (BID) | ORAL | Status: DC
  Administered 2017-01-05 – 2017-01-06 (×4): 20 mg via ORAL
  Filled 2017-01-05 (×5): qty 1

## 2017-01-05 NOTE — ED Notes (Signed)
6E RN and Tech here to transport patient.

## 2017-01-05 NOTE — ED Notes (Signed)
Placed in hospital bed for comfort.

## 2017-01-05 NOTE — ED Notes (Signed)
Patient c/o chest pain worse with movement. States as long as she is lying still she isn't having any pain.

## 2017-01-05 NOTE — H&P (Signed)
History and Physical    [name] NTI:[id number] DOB: 04-Jan-1941 DOA: 01/04/2017  PCP: Binnie Rail, MD  Patient coming from: Home  I have personally briefly reviewed patient's old medical records in [place]  Chief Complaint: Chest pain  HPI: [name] is a 76 y.o. female with medical history significant of CAD, DM, HTN, CABG x3.  Patient started having CP on 11/2.  Got heart cath on 11/6:     They recd medical management.  Patient with increasing CP and SOB on exertion over past couple of weeks since that time.  Symptoms are relieved rapidly by NTG SL.  Patient returns to ED for worsening symptoms.   ED Course: Trop neg.  CTA neg for PE.   Review of Systems: As per HPI otherwise 10 point review of systems negative.   Past Medical History:  Diagnosis Date  . Acute myocardial infarction, unspecified site, episode of care unspecified   . Adenomatous colon polyp   . Allergy   . Anxiety   . Asthma   . Chest pain   . COPD (chronic obstructive pulmonary disease) ([place])    pt is unsure if has been officially diagnosed  . Coronary artery disease   . Diabetes mellitus   . GERD (gastroesophageal reflux disease)   . Hiatal hernia   . Hyperlipidemia   . Hypertension   . PONV (postoperative nausea and vomiting)   . Schatzki's ring   . Shoulder injury    resolved after shoulder surgery  . Sleep apnea    not on cpap  . Unspecified asthma(493.90)   . Upper respiratory symptom    02-13-14 "runny nose, mild cough,sniffles" -instructed to inform MD if symptoms worsens    Past Surgical History:  Procedure Laterality Date  . ABDOMINAL HYSTERECTOMY    . APPENDECTOMY     came out with Hysterectomy  . CARDIAC CATHETERIZATION  07/23/2009   EF 60%  . CARDIAC CATHETERIZATION  10/11/2008  . CARDIAC CATHETERIZATION  03/01/2007   EF 75-80%  . CARDIOVASCULAR STRESS TEST  11/15/2007   EF 60%  . COLONOSCOPY    . CORONARY ARTERY BYPASS GRAFT     SEVERELY DISEASED  SAPHENOUS VEIN GRAFT TO THE RIGHT CORONARY ARTERY BUT WITH FAIRLY WELL PRESERVED FLOW TO THE DISTAL RIGHT CORONARY ARTERY FROM THE NATIVE CIRCULATION-RESTART  CATH IN JUNE 2000, REVEALS MILD/MODERATE  CAD WITH GOOD FLOW DOWN HER LAD  . ESOPHAGOGASTRODUODENOSCOPY    . EYE SURGERY     bilateral cataract surgery with lens implant  . LEFT HEART CATH AND CORS/GRAFTS ANGIOGRAPHY N/A 12/14/2016   Procedure: LEFT HEART CATH AND CORS/GRAFTS ANGIOGRAPHY;  Surgeon: Jettie Booze, MD;  Location: [facility];  Service: Cardiovascular;  Laterality: N/A;  . lense removal Left   . POLYPECTOMY    . ROTATOR CUFF REPAIR     right and left  . TONSILLECTOMY     age 29  . 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 tumor removed kidney    . UPPER GASTROINTESTINAL ENDOSCOPY    . US ECHOCARDIOGRAPHY  03/08/2008   EF 55-60%     reports that she quit smoking about 40 years ago. Her smoking use included cigarettes. She started smoking about 61 years ago. She has a 10.50 pack-year smoking history. she has never used smokeless tobacco. She reports that  she does not drink alcohol or use drugs.  Allergies  Allergen Reactions  . Amlodipine Other (See Comments)    hallucinations   . Banana Nausea And Vomiting    Stomach pumped  . Co Q10 [Coenzyme Q10]     Body cramps  . Codeine     Hallucinate, loose identity and don't know who I am  . Morphine Other (See Comments)    Can not function, it immobilizes me   . Repatha [Evolocumab]     myalgias  . Statins     Muscle cramps  . Sulfonamide Derivatives Swelling    Family History  Problem Relation Age of Onset  . Heart disease Maternal Grandfather   . Heart failure Maternal Grandfather   . Diabetes Maternal Grandfather   . Heart attack Father   . Diabetes Mother   . Rheum arthritis Sister   . Emphysema Paternal  Uncle   . Esophageal cancer Brother 55       she said he was born with it  . Emphysema Paternal Aunt   . Neuropathy Neg Hx   . Multiple sclerosis Neg Hx   . Colon cancer Neg Hx   . Colon polyps Neg Hx   . Rectal cancer Neg Hx   . Stomach cancer Neg Hx      Prior to Admission medications   Medication Sig Start Date End Date Taking? Authorizing Provider  albuterol (PROVENTIL HFA;VENTOLIN HFA) 108 (90 BASE) MCG/ACT inhaler Inhale 2 puffs into the lungs every 6 (six) hours as needed. For shortness of breath. 03/06/12  Yes Elsie Stain, MD  albuterol (PROVENTIL) (2.5 MG/3ML) 0.083% nebulizer solution Take 3 mLs (2.5 mg total) by nebulization every 6 (six) hours as needed for wheezing or shortness of breath. 08/30/16  Yes Magdalen Spatz, NP  alum & mag hydroxide-simeth (MAALOX/MYLANTA) 200-200-20 MG/5ML suspension Take 30 mLs as needed by mouth for indigestion or heartburn.   Yes [provider]  aspirin EC 81 MG tablet Take 1 tablet (81 mg total) by mouth daily. 12/10/16  Yes Weaver, Scott T, PA-C  baclofen (LIORESAL) 20 MG tablet Take 20 mg 2 (two) times daily by mouth.   Yes [provider]  benzonatate (TESSALON) 100 MG capsule Take 100 mg by mouth 2 (two) times daily as needed for cough.    Yes [provider]  budesonide-formoterol (SYMBICORT) 160-4.5 MCG/ACT inhaler Inhale 2 puffs into the lungs 2 (two) times daily. Patient taking differently: Inhale 2 puffs 2 (two) times daily as needed into the lungs (shortness of breath).  08/13/16  Yes Javier Glazier, MD  calcium carbonate (TUMS - DOSED IN MG ELEMENTAL CALCIUM) 500 MG chewable tablet Chew 2 tablets daily as needed by mouth for indigestion or heartburn.   Yes [provider]  Cholecalciferol (VITAMIN D) 2000 UNITS tablet Take 2,000 Units by mouth daily.    Yes [provider]  clonazePAM (KLONOPIN) 0.5 MG tablet Take 0.5 mg daily by mouth.   Yes [provider]  clopidogrel  (PLAVIX) 75 MG tablet Take 1 tablet (75 mg total) by mouth daily. 12/10/16 12/10/17 Yes Weaver, Scott T, PA-C  diltiazem (CARDIZEM CD) 240 MG 24 hr capsule Take 1 capsule (240 mg total) by mouth daily. 08/13/14  Yes Nahser, Wonda Cheng, MD  fexofenadine (ALLEGRA) 180 MG tablet Take 1 tablet (180 mg total) by mouth daily. 09/04/15  Yes Javier Glazier, MD  furosemide (LASIX) 40 MG tablet Take 40 mg every other day by  mouth.    Yes [provider]  gabapentin (NEURONTIN) 100 MG capsule Take 300 mg at night Patient taking differently: Take 300 mg by mouth at bedtime.  11/23/16  Yes Burns, Claudina Lick, MD  insulin lispro protamine-insulin lispro (HUMALOG 75/25) (75-25) 100 UNIT/ML SUSP Inject 20-30 Units into the skin See admin instructions. Takes 20 units in the morning and 30  units with supper   Yes [provider]  isosorbide mononitrate (IMDUR) 60 MG 24 hr tablet Take 1.5 tablets (90 mg total) by mouth daily. 12/10/16 03/10/17 Yes Weaver, Scott T, PA-C  loperamide (IMODIUM) 2 MG capsule Take 2 mg by mouth as needed for diarrhea or loose stools.    Yes [provider]  meclizine (ANTIVERT) 25 MG tablet Take 25 mg by mouth 2 (two) times daily as needed for dizziness.    Yes [provider]  methocarbamol (ROBAXIN) 500 MG tablet Take 500 mg 2 (two) times daily by mouth.   Yes [provider]  metoprolol succinate (TOPROL-XL) 50 MG 24 hr tablet Take 1 tablet (50 mg total) by mouth daily. 03/25/16  Yes Nahser, Wonda Cheng, MD  montelukast (SINGULAIR) 5 MG chewable tablet Chew 1 tablet (5 mg total) by mouth at bedtime. 10/08/14  Yes Javier Glazier, MD  Multiple Vitamin (MULTIVITAMIN WITH MINERALS) TABS Take 1 tablet by mouth daily.   Yes [provider]  nitroGLYCERIN (NITROSTAT) 0.4 MG SL tablet Place 1 tablet (0.4 mg total) under the tongue every 5 (five) minutes as needed. Patient taking differently: Place 0.4 mg every 5 (five) minutes as needed under the  tongue for chest pain.  12/10/16  Yes Weaver, Scott T, PA-C  Pancrelipase, Lip-Prot-Amyl, 24000 units CPEP Take 2 capsules (48,000 Units total) by mouth 3 (three) times daily with meals. Take 1 capsule with a snack 08/27/15  Yes Ladene Artist, MD  potassium chloride SA (K-DUR,KLOR-CON) 20 MEQ tablet Take 20 mEq daily by mouth.    Yes [provider]  pravastatin (PRAVACHOL) 20 MG tablet Take 1 tablet (20 mg total) every evening by mouth. 12/23/16 03/23/17 Yes Nahser, Wonda Cheng, MD  traZODone (DESYREL) 50 MG tablet Take 1-2 tablets (50-100 mg total) at bedtime as needed by mouth for sleep. 12/28/16  Yes Burns, Claudina Lick, MD  Blood Glucose Monitoring Suppl (ONE TOUCH ULTRA MINI) w/Device KIT 3 (three) times daily. for testing 02/26/15   [provider]  Cyanocobalamin (B-12 COMPLIANCE INJECTION) 1000 MCG/ML KIT Inject 1,000 mcg every 30 (thirty) days as directed.    [provider]  cyclobenzaprine (FLEXERIL) 5 MG tablet TAKE 1 TABLET BY MOUTH AT BEDTIME 01/03/17   Melvenia Beam, MD  diclofenac sodium (VOLTAREN) 1 % GEL Apply 2 g topically every 4 (four) hours. Patient taking differently: Apply 2 g every 4 (four) hours as needed topically (muscle pain).  03/20/16   Debbe Odea, MD  Nebulizers (COMPRESSOR/NEBULIZER) MISC Use with albuterol 04/24/14   Elsie Stain, MD  ONE TOUCH ULTRA TEST test strip CHECK BLOOD SUGAR TWICE A DAY DX: E11.9 03/26/15   [provider]  [facility] DELICA LANCETS 94B MISC 3 (three) times daily. for testing 02/26/15   [provider]  pantoprazole (PROTONIX) 40 MG tablet Take 1 tablet (40 mg total) by mouth 2 (two) times daily before a meal. Patient not taking: Reported on 12/28/2016 12/10/16   Richardson Dopp T, PA-C  Spacer/Aero-Holding Chambers (AEROCHAMBER MV) inhaler Use as instructed 08/13/16   Javier Glazier, MD  Physical Exam: Vitals:   01/04/17 2300 01/04/17 2330 01/05/17 0000 01/05/17 0039  BP: 140/76 (!) 148/63 (!)  141/72 134/72  Pulse: 77 83 77 79  Resp: 15 (!) 26 (!) 27 (!) 26  Temp:      TempSrc:      SpO2: 98% 96% 97% 95%    Constitutional: NAD, calm, comfortable Eyes: PERRL, lids and conjunctivae normal ENMT: Mucous membranes are moist. Posterior pharynx clear of any exudate or lesions.Normal dentition.  Neck: normal, supple, no masses, no thyromegaly Respiratory: clear to auscultation bilaterally, no wheezing, no crackles. Normal respiratory effort. No accessory muscle use.  Cardiovascular: Regular rate and rhythm, no murmurs / rubs / gallops. No extremity edema. 2+ pedal pulses. No carotid bruits.  Abdomen: no tenderness, no masses palpated. No hepatosplenomegaly. Bowel sounds positive.  Musculoskeletal: no clubbing / cyanosis. No joint deformity upper and lower extremities. Good ROM, no contractures. Normal muscle tone.  Skin: no rashes, lesions, ulcers. No induration Neurologic: CN 2-12 grossly intact. Sensation intact, DTR normal. Strength 5/5 in all 4.  Psychiatric: Normal judgment and insight. Alert and oriented x 3. Normal mood.    Labs on Admission: I have personally reviewed following labs and imaging studies  CBC: Recent Labs  Lab 01/04/17 1505  WBC 6.1  HGB 9.3*  HCT 31.3*  MCV 80.5  PLT 025   Basic Metabolic Panel: Recent Labs  Lab 01/04/17 1505  NA 144  K 3.9  CL 107  CO2 30  GLUCOSE 85  BUN 11  CREATININE 0.71  CALCIUM 8.7*   GFR: Estimated Creatinine Clearance: 62.5 mL/min (by C-G formula based on SCr of 0.71 mg/dL). Liver Function Tests: No results for input(s): AST, ALT, ALKPHOS, BILITOT, PROT, ALBUMIN in the last 168 hours. No results for input(s): LIPASE, AMYLASE in the last 168 hours. No results for input(s): AMMONIA in the last 168 hours. Coagulation Profile: No results for input(s): INR, PROTIME in the last 168 hours. Cardiac Enzymes: No results for input(s): CKTOTAL, CKMB, CKMBINDEX, TROPONINI in the last 168 hours. BNP (last 3  results) Recent Labs    06/21/16 1628  PROBNP 128   HbA1C: No results for input(s): HGBA1C in the last 72 hours. CBG: No results for input(s): GLUCAP in the last 168 hours. Lipid Profile: No results for input(s): CHOL, HDL, LDLCALC, TRIG, CHOLHDL, LDLDIRECT in the last 72 hours. Thyroid Function Tests: No results for input(s): TSH, T4TOTAL, FREET4, T3FREE, THYROIDAB in the last 72 hours. Anemia Panel: No results for input(s): VITAMINB12, FOLATE, FERRITIN, TIBC, IRON, RETICCTPCT in the last 72 hours. Urine analysis:    Component Value Date/Time   COLORURINE YELLOW 03/18/2016 1450   APPEARANCEUR CLEAR 03/18/2016 1450   LABSPEC 1.015 03/18/2016 1450   PHURINE 5.0 03/18/2016 1450   GLUCOSEU NEGATIVE 03/18/2016 1450   HGBUR NEGATIVE 03/18/2016 1450   BILIRUBINUR NEGATIVE 03/18/2016 1450   KETONESUR NEGATIVE 03/18/2016 1450   PROTEINUR NEGATIVE 03/18/2016 1450   UROBILINOGEN 1.0 02/13/2014 1100   NITRITE POSITIVE (A) 03/18/2016 1450   LEUKOCYTESUR TRACE (A) 03/18/2016 1450    Radiological Exams on Admission: Dg Chest 2 View  Result Date: 01/04/2017 CLINICAL DATA:  LEFT chest pain radiating to LEFT side. EXAM: CHEST  2 VIEW COMPARISON:  08/30/2016 FINDINGS: Sternotomy wires overlie normal cardiac silhouette. No effusion, infiltrate pneumothorax. Chronic bronchitic markings noted. RIGHT shoulder prosthetic. IMPRESSION: No acute cardiopulmonary process. Electronically Signed   By: Suzy Bouchard M.D.   On: 01/04/2017 15:44   Ct Angio Chest Pe W/cm &/  or Wo Cm  Result Date: 01/05/2017 CLINICAL DATA:  Chest pain and shortness of breath. EXAM: CT ANGIOGRAPHY CHEST WITH CONTRAST TECHNIQUE: Multidetector CT imaging of the chest was performed using the standard protocol during bolus administration of intravenous contrast. Multiplanar CT image reconstructions and MIPs were obtained to evaluate the vascular anatomy. CONTRAST:  55 mL ISOVUE-370 IOPAMIDOL (ISOVUE-370) INJECTION 76%. Reduced  contrast dose due to prior contrast-enhanced chest CT earlier this day. COMPARISON:  Chest radiographs yesterday. FINDINGS: Cardiovascular: There are no filling defects within the pulmonary arteries to suggest pulmonary embolus. Normal caliber thoracic aorta without dissection. 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 Borderline mild cardiomegaly. Mitral annulus calcifications. No pericardial effusion. Mediastinum/Nodes: Prominent 11 mm left lower paratracheal node. No hilar adenopathy. The esophagus is decompressed. Visualized thyroid gland is grossly normal. Lungs/Pleura: Mild apical emphysema. Right apical 5 x 6 mm nodule image 20 series 6 with irregular borders. Mild central bronchial thickening. No consolidation. Scattered linear atelectasis. No pulmonary edema. Minimal right pleural thickening posteriorly without pleural fluid. Upper Abdomen: Assessed on abdominal CT yesterday. Excreted IV contrast in bilateral renal collecting systems. Renal cysts partially included. Musculoskeletal: Post median sternotomy. Degenerative change in the spine. There are no acute or suspicious osseous abnormalities. Review of the MIP images confirms the above findings. IMPRESSION: 1. No pulmonary embolus. 2. Mild emphysema with central bronchial thickening. 3. Right apical nodule measuring 6 mm with irregular borders. Non-contrast chest CT at 6-12 months is recommended. If the nodule is stable at time of repeat CT, then future CT at 18-24 months (from today's scan) is considered optional for low-risk patients, but is recommended for high-risk patients. This recommendation follows the consensus statement: Guidelines for Management of Incidental Pulmonary Nodules Detected on CT Images: From the Fleischner Society 2017; Radiology 2017; 284:228-243. 4. Aortic atherosclerosis without acute aortic abnormality. Coronary artery calcifications post CABG. Aortic Atherosclerosis (ICD10-I70.0) and Emphysema  (ICD10-J43.9). Electronically Signed   By: Jeb Levering M.D.   On: 01/05/2017 00:25   Ct Abdomen Pelvis W Contrast  Result Date: 01/04/2017 CLINICAL DATA:  Nausea, onset yesterday. EXAM: CT ABDOMEN AND PELVIS WITH CONTRAST TECHNIQUE: Multidetector CT imaging of the abdomen and pelvis was performed using the standard protocol following bolus administration of intravenous contrast. CONTRAST:  127m ISOVUE-300 IOPAMIDOL (ISOVUE-300) INJECTION 61% COMPARISON:  CT 10/05/2016 FINDINGS: Lower chest: The lung bases are clear. Mitral annulus calcifications. Heart is normal in size. Hepatobiliary: Prominent liver spanning 18.2 cm. Subcentimeter hypodensity in the right dome of the liver, unchanged and likely small cyst. No new focal hepatic lesion. Gallbladder physiologically distended, no calcified stone. No biliary dilatation. Pancreas: Parenchymal atrophy. No ductal dilatation or inflammation. Spleen: Unchanged nonspecific 10 mm hypodensity in the upper central spleen, likely cyst or hemangioma. Adrenals/Urinary Tract: No adrenal nodule. No hydronephrosis or perinephric edema. Cortical scarring in the lower right kidney with adjacent surgical clip. Unchanged 13 mm hypodense lesion in the anterior mid right kidney. Simple cyst in the upper left kidney measuring 4 cm. No new renal lesion. Urinary bladder minimally distended, question of perivesicular stranding. Stomach/Bowel: Stomach is physiologically distended with ingested contents. 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 Appendix is surgically absent. No evidence of bowel wall thickening, distention, or inflammatory changes. Mild distal colonic diverticulosis without diverticulitis. Vascular/Lymphatic: Moderate to advanced aortic and branch atherosclerosis. No aneurysm. No enlarged abdominal or pelvic lymph nodes. Reproductive: Status post hysterectomy. No adnexal masses. Other: Minimal simple free fluid in the  pelvis, increased from prior. No upper abdominal ascites. No free air or intra-abdominal abscess. Musculoskeletal: There are no acute or suspicious osseous abnormalities. Degenerative change in the spine and both sacroiliac joints. IMPRESSION: 1. Minimal pelvic free fluid which is of doubtful clinical significance. No other acute abnormality or explanation for nausea. 2. Stable chronic findings as described. 3.  Aortic Atherosclerosis (ICD10-I70.0). Electronically Signed   By: Jeb Levering M.D.   On: 01/04/2017 23:25    EKG: Independently reviewed.  Assessment/Plan Principal Problem:   Angina pectoris (HCC) Active Problems:   Coronary artery disease   HTN (hypertension), benign   Type 1 diabetes mellitus ([place])    1. Angina - 1. See cath from 3 weeks ago 2. Improved with NTG 3. Probably needs increased dose of Imdur vs other anti-anginal 4. Cards fellow says to admit and cards will see in AM 2. HTN - continue home BP meds 3. DM - continue home 70/30 1. Did double verify with patient that she takes 20 in AM and 30 in PM (yes it does seem odd to do it this way but this is how she takes it). 4. Chronic muscle spasms - 1. Did verify with patient that she does indeed take 3 different muscle relaxer.  DVT prophylaxis: Lovenox Code Status: Full Family Communication: No family in room Disposition Plan: Home after admit Consults called: Cards Admission status: Place in obs   Jaren Kearn, [facility] Pager [phone]  If 7AM-7PM, please contact day team taking care of patient [email or web address] Password [email or web address]  01/05/2017, 1:06 AM

## 2017-01-05 NOTE — ED Notes (Signed)
First NTG given.  Pain decreased from 8 to 6.  Refuses to take anymore at this time.  To let me know if she want's anymore.  C/O headache.  Admitting physician made aware.

## 2017-01-05 NOTE — Consult Note (Signed)
Cardiology Consultation:   Patient ID: [name]; [id number]; 1940/02/25   Admit date: 01/04/2017 Date of Consult: 01/05/2017  Primary Care Provider: Binnie Rail, MD Primary Cardiologist: Dr Acie Fredrickson   Patient Profile:   [name] is a 76 y.o. female with a hx of CAD who is being seen today for the evaluation of chest at the request of Dr Eliseo Squires.  History of Present Illness:   [name] is a 76 y/o AA female with a history of CAD. She is s/p CABG x 3 by Dr Cyndia Bent in 2009. She has had subsequent caths 12/09, 9/10, 6/11, 3/14 and most recently 11/016/18. She has known occluded SVG-PDA and patent native RCA. Her SVG-Dx, and LIMA to LAD are patent. The plan has been for medical Rx. Other problems include asthmatic COPD, IDDM, HLD with statin intolerance (she is followed in the lipid clinic), GERD with stricture, and diverticular GI bleed in Feb 2018.   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 A 30 day event monitor has been ordered, she is to pick this up tomorrow. She is seen now in the ED. She says she has been having chest pain for days. She describes a mid sternal heaviness that goes to her jaw and localized Rt lateral chest pain. Her chest is tender to palpation. She says SL NTG affords her temporary relief. Troponin's negative, EKG unchanged- NSR RBBB. Chest CTA negative for PE.   Past Medical History:  Diagnosis Date  . Adenomatous colon polyp   . Allergy   . Anxiety   . Asthma   . Chest pain   . COPD (chronic obstructive pulmonary disease) ([place])    pt is unsure if has been officially diagnosed  . Coronary artery disease    CABG '09- cathed 12/09, 9/10, 6/11, 12/13/16- medical Rx  . Diabetes mellitus   . GERD (gastroesophageal reflux disease)   . Hiatal hernia   . Hyperlipidemia   . Hypertension   . PONV (postoperative nausea and vomiting)   . Schatzki's ring   . Shoulder injury    resolved after shoulder  surgery  . Sleep apnea    not on cpap    Past Surgical History:  Procedure Laterality Date  . ABDOMINAL HYSTERECTOMY    . APPENDECTOMY     came out with Hysterectomy  . CARDIAC CATHETERIZATION  07/23/2009   EF 60%  . CARDIAC CATHETERIZATION  10/11/2008  . CARDIAC CATHETERIZATION  03/01/2007   EF 75-80%  . CARDIOVASCULAR STRESS TEST  11/15/2007   EF 60%  . COLONOSCOPY    . CORONARY ARTERY BYPASS GRAFT     SEVERELY DISEASED SAPHENOUS VEIN GRAFT TO THE RIGHT CORONARY ARTERY BUT WITH FAIRLY WELL PRESERVED FLOW TO THE DISTAL RIGHT CORONARY ARTERY FROM THE NATIVE CIRCULATION-RESTART  CATH IN JUNE 2000, REVEALS MILD/MODERATE  CAD WITH GOOD FLOW DOWN HER LAD  . ESOPHAGOGASTRODUODENOSCOPY    . EYE SURGERY     bilateral cataract surgery with lens implant  . LEFT HEART CATH AND CORS/GRAFTS ANGIOGRAPHY N/A 12/14/2016   Procedure: LEFT HEART CATH AND CORS/GRAFTS ANGIOGRAPHY;  Surgeon: Jettie Booze, MD;  Location: [facility];  Service: Cardiovascular;  Laterality: N/A;  . lense removal Left   . POLYPECTOMY    . ROTATOR CUFF REPAIR     right and left  . TONSILLECTOMY     age 32  . TOTAL KNEE ARTHROPLASTY Right 02/20/2014   Procedure:  RIGHT TOTAL KNEE ARTHROPLASTY;  Surgeon: Tobi Bastos, MD;  Location: [facility];  Service: Orthopedics;  Laterality: Right;  . tumor removed kidney    . UPPER GASTROINTESTINAL ENDOSCOPY    . US ECHOCARDIOGRAPHY  03/08/2008   EF 55-60%     Home Medications:  Prior to Admission medications   Medication Sig Start Date End Date Taking? Authorizing Provider  albuterol (PROVENTIL HFA;VENTOLIN HFA) 108 (90 BASE) MCG/ACT inhaler Inhale 2 puffs into the lungs every 6 (six) hours as needed. For shortness of breath. 03/06/12  Yes Elsie Stain, MD  albuterol (PROVENTIL) (2.5 MG/3ML) 0.083% nebulizer solution Take 3 mLs (2.5 mg total) by nebulization every 6 (six) hours as needed for wheezing or shortness of breath. 08/30/16  Yes Magdalen Spatz, NP  alum &  mag hydroxide-simeth (MAALOX/MYLANTA) 200-200-20 MG/5ML suspension Take 30 mLs as needed by mouth for indigestion or heartburn.   Yes [provider]  aspirin EC 81 MG tablet Take 1 tablet (81 mg total) by mouth daily. 12/10/16  Yes Weaver, Scott T, PA-C  baclofen (LIORESAL) 20 MG tablet Take 20 mg 2 (two) times daily by mouth.   Yes [provider]  benzonatate (TESSALON) 100 MG capsule Take 100 mg by mouth 2 (two) times daily as needed for cough.    Yes [provider]  budesonide-formoterol (SYMBICORT) 160-4.5 MCG/ACT inhaler Inhale 2 puffs into the lungs 2 (two) times daily. Patient taking differently: Inhale 2 puffs 2 (two) times daily as needed into the lungs (shortness of breath).  08/13/16  Yes Javier Glazier, MD  calcium carbonate (TUMS - DOSED IN MG ELEMENTAL CALCIUM) 500 MG chewable tablet Chew 2 tablets daily as needed by mouth for indigestion or heartburn.   Yes [provider]  Cholecalciferol (VITAMIN D) 2000 UNITS tablet Take 2,000 Units by mouth daily.    Yes [provider]  clonazePAM (KLONOPIN) 0.5 MG tablet Take 0.5 mg daily by mouth.   Yes [provider]  clopidogrel (PLAVIX) 75 MG tablet Take 1 tablet (75 mg total) by mouth daily. 12/10/16 12/10/17 Yes Weaver, Scott T, PA-C  diltiazem (CARDIZEM CD) 240 MG 24 hr capsule Take 1 capsule (240 mg total) by mouth daily. 08/13/14  Yes Nahser, Wonda Cheng, MD  fexofenadine (ALLEGRA) 180 MG tablet Take 1 tablet (180 mg total) by mouth daily. 09/04/15  Yes Javier Glazier, MD  furosemide (LASIX) 40 MG tablet Take 40 mg every other day by mouth.    Yes [provider]  gabapentin (NEURONTIN) 100 MG capsule Take 300 mg at night Patient taking differently: Take 300 mg by mouth at bedtime.  11/23/16  Yes Burns, Claudina Lick, MD  insulin lispro protamine-insulin lispro (HUMALOG 75/25) (75-25) 100 UNIT/ML SUSP Inject 20-30 Units into the skin See admin instructions. Takes 20 units in the  morning and 30  units with supper   Yes [provider]  isosorbide mononitrate (IMDUR) 60 MG 24 hr tablet Take 1.5 tablets (90 mg total) by mouth daily. 12/10/16 03/10/17 Yes Weaver, Scott T, PA-C  loperamide (IMODIUM) 2 MG capsule Take 2 mg by mouth as needed for diarrhea or loose stools.    Yes [provider]  meclizine (ANTIVERT) 25 MG tablet Take 25 mg by mouth 2 (two) times daily as needed for dizziness.    Yes [provider]  methocarbamol (ROBAXIN) 500 MG tablet Take 500 mg 2 (two) times daily by mouth.   Yes [provider]  metoprolol succinate (  TOPROL-XL) 50 MG 24 hr tablet Take 1 tablet (50 mg total) by mouth daily. 03/25/16  Yes Nahser, Wonda Cheng, MD  montelukast (SINGULAIR) 5 MG chewable tablet Chew 1 tablet (5 mg total) by mouth at bedtime. 10/08/14  Yes Javier Glazier, MD  Multiple Vitamin (MULTIVITAMIN WITH MINERALS) TABS Take 1 tablet by mouth daily.   Yes [provider]  nitroGLYCERIN (NITROSTAT) 0.4 MG SL tablet Place 1 tablet (0.4 mg total) under the tongue every 5 (five) minutes as needed. Patient taking differently: Place 0.4 mg every 5 (five) minutes as needed under the tongue for chest pain.  12/10/16  Yes Weaver, Scott T, PA-C  Pancrelipase, Lip-Prot-Amyl, 24000 units CPEP Take 2 capsules (48,000 Units total) by mouth 3 (three) times daily with meals. Take 1 capsule with a snack 08/27/15  Yes Ladene Artist, MD  potassium chloride SA (K-DUR,KLOR-CON) 20 MEQ tablet Take 20 mEq daily by mouth.    Yes [provider]  pravastatin (PRAVACHOL) 20 MG tablet Take 1 tablet (20 mg total) every evening by mouth. 12/23/16 03/23/17 Yes Nahser, Wonda Cheng, MD  traZODone (DESYREL) 50 MG tablet Take 1-2 tablets (50-100 mg total) at bedtime as needed by mouth for sleep. 12/28/16  Yes Burns, Claudina Lick, MD  Blood Glucose Monitoring Suppl (ONE TOUCH ULTRA MINI) w/Device KIT 3 (three) times daily. for testing 02/26/15   [provider]    Cyanocobalamin (B-12 COMPLIANCE INJECTION) 1000 MCG/ML KIT Inject 1,000 mcg every 30 (thirty) days as directed.    [provider]  cyclobenzaprine (FLEXERIL) 5 MG tablet TAKE 1 TABLET BY MOUTH AT BEDTIME 01/03/17   Melvenia Beam, MD  diclofenac sodium (VOLTAREN) 1 % GEL Apply 2 g topically every 4 (four) hours. Patient taking differently: Apply 2 g every 4 (four) hours as needed topically (muscle pain).  03/20/16   Debbe Odea, MD  Nebulizers (COMPRESSOR/NEBULIZER) MISC Use with albuterol 04/24/14   Elsie Stain, MD  ONE TOUCH ULTRA TEST test strip CHECK BLOOD SUGAR TWICE A DAY DX: E11.9 03/26/15   [provider]  [facility] DELICA LANCETS 03U MISC 3 (three) times daily. for testing 02/26/15   [provider]  pantoprazole (PROTONIX) 40 MG tablet Take 1 tablet (40 mg total) by mouth 2 (two) times daily before a meal. Patient not taking: Reported on 12/28/2016 12/10/16   Richardson Dopp T, PA-C  Spacer/Aero-Holding Chambers (AEROCHAMBER MV) inhaler Use as instructed 08/13/16   Javier Glazier, MD    Inpatient Medications: Scheduled Meds: . aspirin EC  81 mg Oral Daily  . baclofen  20 mg Oral BID  . cholecalciferol  2,000 Units Oral Daily  . clonazePAM  0.5 mg Oral Daily  . clopidogrel  75 mg Oral Daily  . cyclobenzaprine  5 mg Oral QHS  . diclofenac sodium  4 g Topical QID  . diltiazem  240 mg Oral Daily  . enoxaparin (LOVENOX) injection  40 mg Subcutaneous Daily  . fluticasone furoate-vilanterol  1 puff Inhalation Daily  . furosemide  40 mg Oral QODAY  . gabapentin  300 mg Oral QHS  . insulin aspart protamine- aspart  20 Units Subcutaneous Q breakfast   And  . insulin aspart protamine- aspart  30 Units Subcutaneous Q supper  . isosorbide mononitrate  90 mg Oral Daily  . lipase/protease/amylase  48,000 Units Oral TID WC  . methocarbamol  500 mg Oral BID WC  . metoprolol succinate  50 mg Oral Daily  . montelukast  5  mg Oral QHS  . multivitamin with  minerals  1 tablet Oral Daily  . pantoprazole  40 mg Oral BID AC  . potassium chloride SA  20 mEq Oral Daily  . pravastatin  20 mg Oral QPM   Continuous Infusions:  PRN Meds: acetaminophen, albuterol, benzonatate, calcium carbonate, lipase/protease/amylase, nitroGLYCERIN, ondansetron (ZOFRAN) IV, traZODone  Allergies:    Allergies  Allergen Reactions  . Amlodipine Other (See Comments)    hallucinations   . Banana Nausea And Vomiting    Stomach pumped  . Co Q10 [Coenzyme Q10]     Body cramps  . Codeine     Hallucinate, loose identity and don't know who I am  . Morphine Other (See Comments)    Can not function, it immobilizes me   . Repatha [Evolocumab]     myalgias  . Statins     Muscle cramps  . Sulfonamide Derivatives Swelling    Social History:   Social History   Socioeconomic History  . Marital status: Widowed    Spouse name: Not on file  . Number of children: 4  . Years of education: Designer, jewellery  . Highest education level: Not on file  Social Needs  . Financial resource strain: Not on file  . Food insecurity - worry: Not on file  . Food insecurity - inability: Not on file  . Transportation needs - medical: Not on file  . Transportation needs - non-medical: Not on file  Occupational History  . Occupation: Retired  Tobacco Use  . Smoking status: Former Smoker    Packs/day: 0.50    Years: 21.00    Pack years: 10.50    Types: Cigarettes    Start date: 02/09/1955    Last attempt to quit: 02/09/1976    Years since quitting: 40.9  . Smokeless tobacco: Never Used  Substance and Sexual Activity  . Alcohol use: No    Alcohol/week: 0.0 oz  . Drug use: No  . Sexual activity: Not on file  Other Topics Concern  . Not on file  Social History Narrative   Lives alone.   Caffeine use: Drinks 1 cup coffee/day      Originally from [place]. Previously has lived in [place]. Prior travel to [place], [place], [place], [place], [place], [place], [place], & [place]. Previously worked in  [employer] She has a dog currently. No bird, mold, or hot tub exposure. She also pastors a church.     Family History:    Family History  Problem Relation Age of Onset  . Heart disease Maternal Grandfather   . Heart failure Maternal Grandfather   . Diabetes Maternal Grandfather   . Heart attack Father   . Diabetes Mother   . Rheum arthritis Sister   . Emphysema Paternal Uncle   . Esophageal cancer Brother 43       she said he was born with it  . Emphysema Paternal Aunt   . Neuropathy Neg Hx   . Multiple sclerosis Neg Hx   . Colon cancer Neg Hx   . Colon polyps Neg Hx   . Rectal cancer Neg Hx   . Stomach cancer Neg Hx      ROS:  Please see the history of present illness.  ROS  All other ROS reviewed and negative.     Physical Exam/Data:   Vitals:   01/05/17 0600 01/05/17 0800 01/05/17 0900 01/05/17 0933  BP: (!) 153/77 (!) 147/77 137/75 137/75  Pulse: 82 75 87 80  Resp: Marland Kitchen)  29 19 (!) 22 (!) 24  Temp:      TempSrc:      SpO2: 90% 91% 91% 94%   No intake or output data in the 24 hours ending 01/05/17 1214 There were no vitals filed for this visit. There is no height or weight on file to calculate BMI.  General:  Obese AA female, well developed, in no acute distress HEENT: normal Lymph: no adenopathy Neck: no JVD, no bruit Endocrine:  No thryomegaly Vascular: No carotid bruits; FA pulses 2+ bilaterally without bruits  Cardiac:  normal S1, S2; RRR; no murmur  Lungs:  clear to auscultation bilaterally, no wheezing, rhonchi or rales  Abd: obese, soft, nontender, no hepatomegaly  Ext: no edema Musculoskeletal:  No deformities, BUE and BLE strength normal and equal Skin: warm and dry  Neuro:  CNs 2-12 intact, no focal abnormalities noted Psych:  Normal affect   EKG:  The EKG was personally reviewed and demonstrates:  NSR, RBBB  Relevant CV Studies: Cath 12/14/16-         Laboratory Data:  Chemistry Recent Labs  Lab 01/04/17 1505  NA 144  K 3.9    CL 107  CO2 30  GLUCOSE 85  BUN 11  CREATININE 0.71  CALCIUM 8.7*  GFRNONAA >60  GFRAA >60  ANIONGAP 7    No results for input(s): PROT, ALBUMIN, AST, ALT, ALKPHOS, BILITOT in the last 168 hours. Hematology Recent Labs  Lab 01/04/17 1505  WBC 6.1  RBC 3.89  HGB 9.3*  HCT 31.3*  MCV 80.5  MCH 23.9*  MCHC 29.7*  RDW 16.4*  PLT 271   Cardiac Enzymes Recent Labs  Lab 01/05/17 0013 01/05/17 0241 01/05/17 0604 01/05/17 1020  TROPONINI <0.03 <0.03 <0.03 <0.03    Recent Labs  Lab 01/04/17 1516  TROPIPOC 0.00    BNP Recent Labs  Lab 01/04/17 1505  BNP 315.0*    DDimer  Recent Labs  Lab 01/04/17 1505  DDIMER 1.06*    Radiology/Studies:  Dg Chest 2 View  Result Date: 01/04/2017 CLINICAL DATA:  LEFT chest pain radiating to LEFT side. EXAM: CHEST  2 VIEW COMPARISON:  08/30/2016 FINDINGS: Sternotomy wires overlie normal cardiac silhouette. No effusion, infiltrate pneumothorax. Chronic bronchitic markings noted. RIGHT shoulder prosthetic. IMPRESSION: No acute cardiopulmonary process. Electronically Signed   By: Suzy Bouchard M.D.   On: 01/04/2017 15:44   Ct Angio Chest Pe W/cm &/or Wo Cm  Result Date: 01/05/2017 CLINICAL DATA:  Chest pain and shortness of breath. EXAM: CT ANGIOGRAPHY CHEST WITH CONTRAST TECHNIQUE: Multidetector CT imaging of the chest was performed using the standard protocol during bolus administration of intravenous contrast. Multiplanar CT image reconstructions and MIPs were obtained to evaluate the vascular anatomy. CONTRAST:  55 mL ISOVUE-370 IOPAMIDOL (ISOVUE-370) INJECTION 76%. Reduced contrast dose due to prior contrast-enhanced chest CT earlier this day. COMPARISON:  Chest radiographs yesterday. FINDINGS: Cardiovascular: There are no filling defects within the pulmonary arteries to suggest pulmonary embolus. Normal caliber thoracic aorta without dissection. 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 Borderline mild  cardiomegaly. Mitral annulus calcifications. No pericardial effusion. Mediastinum/Nodes: Prominent 11 mm left lower paratracheal node. No hilar adenopathy. The esophagus is decompressed. Visualized thyroid gland is grossly normal. Lungs/Pleura: Mild apical emphysema. Right apical 5 x 6 mm nodule image 20 series 6 with irregular borders. Mild central bronchial thickening. No consolidation. Scattered linear atelectasis. No pulmonary edema. Minimal right pleural thickening posteriorly without pleural fluid. Upper Abdomen: Assessed on abdominal CT  yesterday. Excreted IV contrast in bilateral renal collecting systems. Renal cysts partially included. Musculoskeletal: Post median sternotomy. Degenerative change in the spine. There are no acute or suspicious osseous abnormalities. Review of the MIP images confirms the above findings. IMPRESSION: 1. No pulmonary embolus. 2. Mild emphysema with central bronchial thickening. 3. Right apical nodule measuring 6 mm with irregular borders. Non-contrast chest CT at 6-12 months is recommended. If the nodule is stable at time of repeat CT, then future CT at 18-24 months (from today's scan) is considered optional for low-risk patients, but is recommended for high-risk patients. This recommendation follows the consensus statement: Guidelines for Management of Incidental Pulmonary Nodules Detected on CT Images: From the Fleischner Society 2017; Radiology 2017; 284:228-243. 4. Aortic atherosclerosis without acute aortic abnormality. Coronary artery calcifications post CABG. Aortic Atherosclerosis (ICD10-I70.0) and Emphysema (ICD10-J43.9). Electronically Signed   By: Jeb Levering M.D.   On: 01/05/2017 00:25   Ct Abdomen Pelvis W Contrast  Result Date: 01/04/2017 CLINICAL DATA:  Nausea, onset yesterday. EXAM: CT ABDOMEN AND PELVIS WITH CONTRAST TECHNIQUE: Multidetector CT imaging of the abdomen and pelvis was performed using the standard protocol following bolus administration of  intravenous contrast. CONTRAST:  120m ISOVUE-300 IOPAMIDOL (ISOVUE-300) INJECTION 61% COMPARISON:  CT 10/05/2016 FINDINGS: Lower chest: The lung bases are clear. Mitral annulus calcifications. Heart is normal in size. Hepatobiliary: Prominent liver spanning 18.2 cm. Subcentimeter hypodensity in the right dome of the liver, unchanged and likely small cyst. No new focal hepatic lesion. Gallbladder physiologically distended, no calcified stone. No biliary dilatation. Pancreas: Parenchymal atrophy. No ductal dilatation or inflammation. Spleen: Unchanged nonspecific 10 mm hypodensity in the upper central spleen, likely cyst or hemangioma. Adrenals/Urinary Tract: No adrenal nodule. No hydronephrosis or perinephric edema. Cortical scarring in the lower right kidney with adjacent surgical clip. Unchanged 13 mm hypodense lesion in the anterior mid right kidney. Simple cyst in the upper left kidney measuring 4 cm. No new renal lesion. Urinary bladder minimally distended, question of perivesicular stranding. Stomach/Bowel: Stomach is physiologically distended with ingested contents. 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 Appendix is surgically absent. No evidence of bowel wall thickening, distention, or inflammatory changes. Mild distal colonic diverticulosis without diverticulitis. Vascular/Lymphatic: Moderate to advanced aortic and branch atherosclerosis. No aneurysm. No enlarged abdominal or pelvic lymph nodes. Reproductive: Status post hysterectomy. No adnexal masses. Other: Minimal simple free fluid in the pelvis, increased from prior. No upper abdominal ascites. No free air or intra-abdominal abscess. Musculoskeletal: There are no acute or suspicious osseous abnormalities. Degenerative change in the spine and both sacroiliac joints. IMPRESSION: 1. Minimal pelvic free fluid which is of doubtful clinical significance. No other acute abnormality or explanation for  nausea. 2. Stable chronic findings as described. 3.  Aortic Atherosclerosis (ICD10-I70.0). Electronically Signed   By: MJeb LeveringM.D.   On: 01/04/2017 23:25    Assessment and Plan:   Chest pain- Doubt cardiac. There are no EKG changes and no Troponin elevation to suggest coronary spasm.  CAD- CABG in 2009, 5 subsequent caths showing patent coronary circulation. She has an occluded SVG-PDA with a patent native RCA.   Plan: Keep appointment for Event Monitor tomorrow for history of syncope 12/23/16. She has a f/u with B. Simmons on 11/29 as well, I'll change that to the next few weeks. Her BNP is 315, she should take her Lasix 40 mg daily for 3 days then go back to her usual dose.    For questions or  updates, please contact [place] Please consult [email or web address] for contact info under Cardiology/STEMI.   Signed, Kerin Ransom, PA-C  01/05/2017 12:14 PM   I have examined the patient and reviewed assessment and plan and discussed with patient.  Agree with above as stated.  Patient with atypical chest pain.  Sounds more musculoskeletal.  Negative troponins and recent cath that was reassuring.  Her BNP is increased.  Would increase frequency of her Lasix for a few days and then return to normal.  She may need more diuretic for chronic diastolic heart failure.    Larae Grooms

## 2017-01-05 NOTE — ED Notes (Signed)
Taken to CT at this time. 

## 2017-01-05 NOTE — ED Notes (Signed)
Requested hospital bed from [facility] 

## 2017-01-05 NOTE — Progress Notes (Signed)
Patient admitted after midnight, please see H&P.  Here with chest pain---  Recent cath.  Chest wall VERY tender.  Add scheduled Voltaren gel to chest wall.    [name] Bear DO

## 2017-01-06 ENCOUNTER — Other Ambulatory Visit: Payer: Self-pay

## 2017-01-06 ENCOUNTER — Ambulatory Visit: Payer: Medicare Other | Admitting: Pulmonary Disease

## 2017-01-06 ENCOUNTER — Ambulatory Visit: Payer: Medicare Other | Admitting: Cardiology

## 2017-01-06 ENCOUNTER — Encounter (HOSPITAL_COMMUNITY): Payer: Self-pay

## 2017-01-06 DIAGNOSIS — I25709 Atherosclerosis of coronary artery bypass graft(s), unspecified, with unspecified angina pectoris: Secondary | ICD-10-CM | POA: Diagnosis not present

## 2017-01-06 DIAGNOSIS — E538 Deficiency of other specified B group vitamins: Secondary | ICD-10-CM | POA: Diagnosis not present

## 2017-01-06 DIAGNOSIS — E1059 Type 1 diabetes mellitus with other circulatory complications: Secondary | ICD-10-CM

## 2017-01-06 DIAGNOSIS — I209 Angina pectoris, unspecified: Secondary | ICD-10-CM | POA: Diagnosis not present

## 2017-01-06 DIAGNOSIS — R079 Chest pain, unspecified: Secondary | ICD-10-CM

## 2017-01-06 DIAGNOSIS — I10 Essential (primary) hypertension: Secondary | ICD-10-CM | POA: Diagnosis not present

## 2017-01-06 LAB — GLUCOSE, CAPILLARY
GLUCOSE-CAPILLARY: 131 mg/dL — AB (ref 65–99)
Glucose-Capillary: 125 mg/dL — ABNORMAL HIGH (ref 65–99)
Glucose-Capillary: 205 mg/dL — ABNORMAL HIGH (ref 65–99)

## 2017-01-06 MED ORDER — FUROSEMIDE 40 MG PO TABS
40.0000 mg | ORAL_TABLET | Freq: Every day | ORAL | Status: DC
  Filled 2017-01-06: qty 1

## 2017-01-06 NOTE — Progress Notes (Signed)
Progress Note  Patient Name: [name] Date of Encounter: 01/06/2017  Primary Cardiologist: Nahser   Subjective   76 year old female with a history of coronary artery disease.  She presents with atypical chest pain.  She had a recent heart catheterization that did not reveal any significant coronary artery irregularities. Has a history of diastolic congestive heart failure.  Troponins have been negative.  Inpatient Medications    Scheduled Meds: . aspirin EC  81 mg Oral Daily  . baclofen  20 mg Oral BID  . cholecalciferol  2,000 Units Oral Daily  . clonazePAM  0.5 mg Oral Daily  . clopidogrel  75 mg Oral Daily  . cyclobenzaprine  5 mg Oral QHS  . diclofenac sodium  4 g Topical QID  . diltiazem  240 mg Oral Daily  . enoxaparin (LOVENOX) injection  40 mg Subcutaneous Daily  . fluticasone furoate-vilanterol  1 puff Inhalation Daily  . furosemide  40 mg Oral QODAY  . gabapentin  300 mg Oral QHS  . insulin aspart protamine- aspart  20 Units Subcutaneous Q breakfast   And  . insulin aspart protamine- aspart  30 Units Subcutaneous Q supper  . isosorbide mononitrate  90 mg Oral Daily  . lipase/protease/amylase  48,000 Units Oral TID WC  . methocarbamol  500 mg Oral BID WC  . metoprolol succinate  50 mg Oral Daily  . montelukast  5 mg Oral QHS  . multivitamin with minerals  1 tablet Oral Daily  . pantoprazole  40 mg Oral BID AC  . potassium chloride SA  20 mEq Oral Daily  . pravastatin  20 mg Oral QPM   Continuous Infusions:  PRN Meds: acetaminophen, albuterol, benzonatate, calcium carbonate, lipase/protease/amylase, nitroGLYCERIN, ondansetron (ZOFRAN) IV, traZODone   Vital Signs    Vitals:   01/05/17 1800 01/05/17 2010 01/06/17 0541 01/06/17 0937  BP: (!) 138/109 113/63 128/67 119/64  Pulse: 69 67 79   Resp: 19     Temp:  98.6 F (37 C) 98.4 F (36.9 C)   TempSrc:  Oral Oral   SpO2: 92% 98% 100%   Weight:  205 lb 4.8 oz (93.1 kg) 204 lb 8 oz (92.8 kg)     Height:  5\' 2"  (1.575 m)      Intake/Output Summary (Last 24 hours) at 01/06/2017 0939 Last data filed at 01/06/2017 0541 Gross per 24 hour  Intake 240 ml  Output 900 ml  Net -660 ml   Filed Weights   01/05/17 2010 01/06/17 0541  Weight: 205 lb 4.8 oz (93.1 kg) 204 lb 8 oz (92.8 kg)    Telemetry    Normal sinus rhythm- Personally Reviewed  ECG    Normal sinus rhythm.  Right bundle branch block.   there are no ST or T wave changes.- Personally Reviewed  Physical Exam   GEN: No acute distress.   Neck: No JVD Cardiac: RRR, no murmurs, rubs, or gallops.  Respiratory: Clear to auscultation bilaterally. GI: Soft, nontender, non-distended  MS: No edema; No deformity. Neuro:  Nonfocal  Psych: Normal affect   Labs    Chemistry Recent Labs  Lab 01/04/17 1505  NA 144  K 3.9  CL 107  CO2 30  GLUCOSE 85  BUN 11  CREATININE 0.71  CALCIUM 8.7*  GFRNONAA >60  GFRAA >60  ANIONGAP 7     Hematology Recent Labs  Lab 01/04/17 1505  WBC 6.1  RBC 3.89  HGB 9.3*  HCT 31.3*  MCV 80.5  MCH 23.9*  MCHC 29.7*  RDW 16.4*  PLT 271    Cardiac Enzymes Recent Labs  Lab 01/05/17 0013 01/05/17 0241 01/05/17 0604 01/05/17 1020  TROPONINI <0.03 <0.03 <0.03 <0.03    Recent Labs  Lab 01/04/17 1516  TROPIPOC 0.00     BNP Recent Labs  Lab 01/04/17 1505  BNP 315.0*     DDimer  Recent Labs  Lab 01/04/17 1505  DDIMER 1.06*     Radiology    Dg Chest 2 View  Result Date: 01/04/2017 CLINICAL DATA:  LEFT chest pain radiating to LEFT side. EXAM: CHEST  2 VIEW COMPARISON:  08/30/2016 FINDINGS: Sternotomy wires overlie normal cardiac silhouette. No effusion, infiltrate pneumothorax. Chronic bronchitic markings noted. RIGHT shoulder prosthetic. IMPRESSION: No acute cardiopulmonary process. Electronically Signed   By: Suzy Bouchard M.D.   On: 01/04/2017 15:44   Ct Angio Chest Pe W/cm &/or Wo Cm  Result Date: 01/05/2017 CLINICAL DATA:  Chest pain and  shortness of breath. EXAM: CT ANGIOGRAPHY CHEST WITH CONTRAST TECHNIQUE: Multidetector CT imaging of the chest was performed using the standard protocol during bolus administration of intravenous contrast. Multiplanar CT image reconstructions and MIPs were obtained to evaluate the vascular anatomy. CONTRAST:  55 mL ISOVUE-370 IOPAMIDOL (ISOVUE-370) INJECTION 76%. Reduced contrast dose due to prior contrast-enhanced chest CT earlier this day. COMPARISON:  Chest radiographs yesterday. FINDINGS: Cardiovascular: There are no filling defects within the pulmonary arteries to suggest pulmonary embolus. Normal caliber thoracic aorta without dissection. 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 Borderline mild cardiomegaly. Mitral annulus calcifications. No pericardial effusion. Mediastinum/Nodes: Prominent 11 mm left lower paratracheal node. No hilar adenopathy. The esophagus is decompressed. Visualized thyroid gland is grossly normal. Lungs/Pleura: Mild apical emphysema. Right apical 5 x 6 mm nodule image 20 series 6 with irregular borders. Mild central bronchial thickening. No consolidation. Scattered linear atelectasis. No pulmonary edema. Minimal right pleural thickening posteriorly without pleural fluid. Upper Abdomen: Assessed on abdominal CT yesterday. Excreted IV contrast in bilateral renal collecting systems. Renal cysts partially included. Musculoskeletal: Post median sternotomy. Degenerative change in the spine. There are no acute or suspicious osseous abnormalities. Review of the MIP images confirms the above findings. IMPRESSION: 1. No pulmonary embolus. 2. Mild emphysema with central bronchial thickening. 3. Right apical nodule measuring 6 mm with irregular borders. Non-contrast chest CT at 6-12 months is recommended. If the nodule is stable at time of repeat CT, then future CT at 18-24 months (from today's scan) is considered optional for low-risk patients, but is recommended for high-risk  patients. This recommendation follows the consensus statement: Guidelines for Management of Incidental Pulmonary Nodules Detected on CT Images: From the Fleischner Society 2017; Radiology 2017; 284:228-243. 4. Aortic atherosclerosis without acute aortic abnormality. Coronary artery calcifications post CABG. Aortic Atherosclerosis (ICD10-I70.0) and Emphysema (ICD10-J43.9). Electronically Signed   By: Jeb Levering M.D.   On: 01/05/2017 00:25   Ct Abdomen Pelvis W Contrast  Result Date: 01/04/2017 CLINICAL DATA:  Nausea, onset yesterday. EXAM: CT ABDOMEN AND PELVIS WITH CONTRAST TECHNIQUE: Multidetector CT imaging of the abdomen and pelvis was performed using the standard protocol following bolus administration of intravenous contrast. CONTRAST:  138mL ISOVUE-300 IOPAMIDOL (ISOVUE-300) INJECTION 61% COMPARISON:  CT 10/05/2016 FINDINGS: Lower chest: The lung bases are clear. Mitral annulus calcifications. Heart is normal in size. Hepatobiliary: Prominent liver spanning 18.2 cm. Subcentimeter hypodensity in the right dome of the liver, unchanged and likely small cyst. No new focal hepatic lesion. Gallbladder physiologically distended, no calcified stone.  No biliary dilatation. Pancreas: Parenchymal atrophy. No ductal dilatation or inflammation. Spleen: Unchanged nonspecific 10 mm hypodensity in the upper central spleen, likely cyst or hemangioma. Adrenals/Urinary Tract: No adrenal nodule. No hydronephrosis or perinephric edema. Cortical scarring in the lower right kidney with adjacent surgical clip. Unchanged 13 mm hypodense lesion in the anterior mid right kidney. Simple cyst in the upper left kidney measuring 4 cm. No new renal lesion. Urinary bladder minimally distended, question of perivesicular stranding. Stomach/Bowel: Stomach is physiologically distended with ingested contents. 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 Appendix is surgically absent. No  evidence of bowel wall thickening, distention, or inflammatory changes. Mild distal colonic diverticulosis without diverticulitis. Vascular/Lymphatic: Moderate to advanced aortic and branch atherosclerosis. No aneurysm. No enlarged abdominal or pelvic lymph nodes. Reproductive: Status post hysterectomy. No adnexal masses. Other: Minimal simple free fluid in the pelvis, increased from prior. No upper abdominal ascites. No free air or intra-abdominal abscess. Musculoskeletal: There are no acute or suspicious osseous abnormalities. Degenerative change in the spine and both sacroiliac joints. IMPRESSION: 1. Minimal pelvic free fluid which is of doubtful clinical significance. No other acute abnormality or explanation for nausea. 2. Stable chronic findings as described. 3.  Aortic Atherosclerosis (ICD10-I70.0). Electronically Signed   By: Jeb Levering M.D.   On: 01/04/2017 23:25    Cardiac Studies     Patient Profile     76 y.o. female with a history of coronary artery disease.  She had a recent heart catheterization that revealed patent grafts.  She has mild coronary disease otherwise.  She is admitted with atypical chest pain.  Assessment & Plan    1.  Chest pain: The chest pain is atypical.  She has ruled out for myocardial infarction.  She had a heart catheterization on November 6 which did not reveal any significant irregularities.  CT angiogram yesterday reveals no evidence of pulmonary embolus.  There is mild apical emphysema. There is a right apical nodule measuring 6 mm.  Repeat CT scan at 6-12 months is recommended.  She had an echocardiogram in the past that revealed elevated end-diastolic filling pressures.  She also has moderate pulmonary artery hypertension with an estimated PA pressure of 42.  She may benefit from increasing her Lasix from every other day to once daily schedule.  I think she could be discharged home today.  She does not need any additional cardiac workup from our  standpoint.  She needs to go to the office to pick up her event monitor to follow-up with an episode of syncope that she had several weeks ago. She needs to reschedule her pulmonary appointment which was scheduled for today.  For questions or updates, please contact [place] Please consult [email or web address] for contact info under Cardiology/STEMI.      Signed, Mertie Moores, MD  01/06/2017, 9:39 AM

## 2017-01-06 NOTE — Discharge Summary (Addendum)
Discharge Summary  [name] FTD:[id number] DOB: 10-21-1940  PCP: Binnie Rail, MD  Admit date: 01/04/2017 Discharge date: 01/06/2017  Time spent: 25 MINUTES  Recommendations for Outpatient Follow-up:  1. Follow up with your cardiologist within a week 2. Follow up with your pulmonologist within a week 3. Follow up with your PCP within a week  Discharge Diagnoses:  Active Hospital Problems   Diagnosis Date Noted  . Angina pectoris ([place]) 01/05/2017  . Type 1 diabetes mellitus ([place]) 07/21/2015  . HTN (hypertension), benign 10/05/2011  . Coronary artery disease 08/27/2010    Resolved Hospital Problems  No resolved problems to display.    Discharge Condition: Stable  Diet recommendation: Heart healthy diet  Vitals:   01/06/17 0541 01/06/17 0937  BP: 128/67 119/64  Pulse: 79 70  Resp:    Temp: 98.4 F (36.9 C)   SpO2: 100%     History of present illness:  [name] is a 76 y.o. female with medical history significant of CAD, DM, HTN, CABG x3. Patient started having chest pain on 12/10/16.  Got heart cath on 12/14/16:They recd medical management. Patient with increasing CP and SOB on exertion over past couple of weeks since that time. Symptoms are relieved rapidly by NTG SL.  Patient returns to ED for worsening symptoms. Upon presentation to the ED, Trop neg.  CTA neg for PE. Cardiology followed the patient and recommended follow up as outpatient. No further workup in the hospital setting.  On the day of discharge the pt was hemodynamically stable. She reported chest wall pain improved to 3/10.  Patient will need to follow up with her cardiologist within a week.    Hospital Course:  Principal Problem:   Angina pectoris ([place]) Active Problems:   Coronary artery disease   HTN (hypertension), benign   Type 1 diabetes mellitus ([place])   1. Chest pain r/o ACS 1. Improved with NTG 2. ACS ruled out 3. Troponin negative x3; CT PE negative 4. Probably needs  increased dose of Imdur vs other anti-anginal 5. Cards fellow says to admit and cards saw the patient and recommended outpatient follow up with no further workup in the hospital setting. 2. HTN - continue home BP meds 3. DM - continue home 70/30 4. Chronic muscle spasms - 1. Patient takes 3 different muscle relaxer.   Procedures:  None  Consultations:  Cardiology  Discharge Exam: BP 119/64   Pulse 70   Temp 98.4 F (36.9 C) (Oral)   Resp 19   Ht '5\' 2"'  (1.575 m)   Wt 92.8 kg (204 lb 8 oz)   LMP  (LMP Unknown)   SpO2 100%   BMI 37.40 kg/m   General: 76 yo AAF WD WN NAD A&O x3 Cardiovascular: RRR no rubs or gallops Respiratory: CTA with no wheezes or rhonchi  Discharge Instructions You were cared for by a hospitalist during your hospital stay. If you have any questions about your discharge medications or the care you received while you were in the hospital after you are discharged, you can call the unit and asked to speak with the hospitalist on call if the hospitalist that took care of you is not available. Once you are discharged, your primary care physician will handle any further medical issues. Please note that NO REFILLS for any discharge medications will be authorized once you are discharged, as it is imperative that you return to your primary care physician (or establish a relationship with a primary care physician  if you do not have one) for your aftercare needs so that they can reassess your need for medications and monitor your lab values.   Allergies as of 01/06/2017      Reactions   Amlodipine Other (See Comments)   hallucinations    Banana Nausea And Vomiting   Stomach pumped   Co Q10 [coenzyme Q10]    Body cramps   Codeine    Hallucinate, loose identity and don't know who I am   Morphine Other (See Comments)   Can not function, it immobilizes me    Repatha [evolocumab]    myalgias   Statins    Muscle cramps   Sulfonamide Derivatives Swelling        Medication List    TAKE these medications   AEROCHAMBER MV inhaler Use as instructed   albuterol 108 (90 Base) MCG/ACT inhaler Commonly known as:  PROVENTIL HFA;VENTOLIN HFA Inhale 2 puffs into the lungs every 6 (six) hours as needed. For shortness of breath. What changed:  Another medication with the same name was removed. Continue taking this medication, and follow the directions you see here.   alum & mag hydroxide-simeth 200-200-20 MG/5ML suspension Commonly known as:  MAALOX/MYLANTA Take 30 mLs as needed by mouth for indigestion or heartburn.   aspirin EC 81 MG tablet Take 1 tablet (81 mg total) by mouth daily.   B-12 COMPLIANCE INJECTION 1000 MCG/ML Kit Generic drug:  Cyanocobalamin Inject 1,000 mcg every 30 (thirty) days as directed.   baclofen 20 MG tablet Commonly known as:  LIORESAL Take 20 mg 2 (two) times daily by mouth.   benzonatate 100 MG capsule Commonly known as:  TESSALON Take 100 mg by mouth 2 (two) times daily as needed for cough.   budesonide-formoterol 160-4.5 MCG/ACT inhaler Commonly known as:  SYMBICORT Inhale 2 puffs into the lungs 2 (two) times daily. What changed:    when to take this  reasons to take this   calcium carbonate 500 MG chewable tablet Commonly known as:  TUMS - dosed in mg elemental calcium Chew 2 tablets daily as needed by mouth for indigestion or heartburn.   clonazePAM 0.5 MG tablet Commonly known as:  KLONOPIN Take 0.5 mg daily by mouth.   clopidogrel 75 MG tablet Commonly known as:  PLAVIX Take 1 tablet (75 mg total) by mouth daily.   Compressor/Nebulizer Misc Use with albuterol   cyclobenzaprine 5 MG tablet Commonly known as:  FLEXERIL TAKE 1 TABLET BY MOUTH AT BEDTIME   diclofenac sodium 1 % Gel Commonly known as:  VOLTAREN Apply 2 g topically every 4 (four) hours. What changed:    when to take this  reasons to take this   diltiazem 240 MG 24 hr capsule Commonly known as:  CARDIZEM CD Take 1 capsule  (240 mg total) by mouth daily.   fexofenadine 180 MG tablet Commonly known as:  ALLEGRA Take 1 tablet (180 mg total) by mouth daily.   furosemide 40 MG tablet Commonly known as:  LASIX Take 40 mg every other day by mouth.   gabapentin 100 MG capsule Commonly known as:  NEURONTIN Take 300 mg at night What changed:    how much to take  how to take this  when to take this  additional instructions   insulin lispro protamine-lispro (75-25) 100 UNIT/ML Susp injection Commonly known as:  HUMALOG 75/25 MIX Inject 20-30 Units into the skin See admin instructions. Takes 20 units in the morning and 30  units with  supper   isosorbide mononitrate 60 MG 24 hr tablet Commonly known as:  IMDUR Take 1.5 tablets (90 mg total) by mouth daily.   loperamide 2 MG capsule Commonly known as:  IMODIUM Take 2 mg by mouth as needed for diarrhea or loose stools.   meclizine 25 MG tablet Commonly known as:  ANTIVERT Take 25 mg by mouth 2 (two) times daily as needed for dizziness.   methocarbamol 500 MG tablet Commonly known as:  ROBAXIN Take 500 mg 2 (two) times daily by mouth.   metoprolol succinate 50 MG 24 hr tablet Commonly known as:  TOPROL-XL Take 1 tablet (50 mg total) by mouth daily.   montelukast 5 MG chewable tablet Commonly known as:  SINGULAIR Chew 1 tablet (5 mg total) by mouth at bedtime.   multivitamin with minerals Tabs tablet Take 1 tablet by mouth daily.   nitroGLYCERIN 0.4 MG SL tablet Commonly known as:  NITROSTAT Place 1 tablet (0.4 mg total) under the tongue every 5 (five) minutes as needed. What changed:  reasons to take this   ONE TOUCH ULTRA MINI w/Device Kit 3 (three) times daily. for testing   ONE TOUCH ULTRA TEST test strip Generic drug:  glucose blood CHECK BLOOD SUGAR TWICE A DAY DX: G62.6   ONETOUCH DELICA LANCETS 94W Misc 3 (three) times daily. for testing   Pancrelipase (Lip-Prot-Amyl) 24000-76000 units Cpep Take 2 capsules (48,000 Units  total) by mouth 3 (three) times daily with meals. Take 1 capsule with a snack   pantoprazole 40 MG tablet Commonly known as:  PROTONIX Take 1 tablet (40 mg total) by mouth 2 (two) times daily before a meal.   potassium chloride SA 20 MEQ tablet Commonly known as:  K-DUR,KLOR-CON Take 20 mEq daily by mouth.   pravastatin 20 MG tablet Commonly known as:  PRAVACHOL Take 1 tablet (20 mg total) every evening by mouth.   traZODone 50 MG tablet Commonly known as:  DESYREL Take 1-2 tablets (50-100 mg total) at bedtime as needed by mouth for sleep.   Vitamin D 2000 units tablet Take 2,000 Units by mouth daily.      Allergies  Allergen Reactions  . Amlodipine Other (See Comments)    hallucinations   . Banana Nausea And Vomiting    Stomach pumped  . Co Q10 [Coenzyme Q10]     Body cramps  . Codeine     Hallucinate, loose identity and don't know who I am  . Morphine Other (See Comments)    Can not function, it immobilizes me   . Repatha [Evolocumab]     myalgias  . Statins     Muscle cramps  . Sulfonamide Derivatives Swelling      The results of significant diagnostics from this hospitalization (including imaging, microbiology, ancillary and laboratory) are listed below for reference.    Significant Diagnostic Studies: Dg Chest 2 View  Result Date: 01/04/2017 CLINICAL DATA:  LEFT chest pain radiating to LEFT side. EXAM: CHEST  2 VIEW COMPARISON:  08/30/2016 FINDINGS: Sternotomy wires overlie normal cardiac silhouette. No effusion, infiltrate pneumothorax. Chronic bronchitic markings noted. RIGHT shoulder prosthetic. IMPRESSION: No acute cardiopulmonary process. Electronically Signed   By: Suzy Bouchard M.D.   On: 01/04/2017 15:44   Ct Angio Chest Pe W/cm &/or Wo Cm  Result Date: 01/05/2017 CLINICAL DATA:  Chest pain and shortness of breath. EXAM: CT ANGIOGRAPHY CHEST WITH CONTRAST TECHNIQUE: Multidetector CT imaging of the chest was performed using the standard protocol  during bolus administration  of intravenous contrast. Multiplanar CT image reconstructions and MIPs were obtained to evaluate the vascular anatomy. CONTRAST:  55 mL ISOVUE-370 IOPAMIDOL (ISOVUE-370) INJECTION 76%. Reduced contrast dose due to prior contrast-enhanced chest CT earlier this day. COMPARISON:  Chest radiographs yesterday. FINDINGS: Cardiovascular: There are no filling defects within the pulmonary arteries to suggest pulmonary embolus. Normal caliber thoracic aorta without dissection. 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 Borderline mild cardiomegaly. Mitral annulus calcifications. No pericardial effusion. Mediastinum/Nodes: Prominent 11 mm left lower paratracheal node. No hilar adenopathy. The esophagus is decompressed. Visualized thyroid gland is grossly normal. Lungs/Pleura: Mild apical emphysema. Right apical 5 x 6 mm nodule image 20 series 6 with irregular borders. Mild central bronchial thickening. No consolidation. Scattered linear atelectasis. No pulmonary edema. Minimal right pleural thickening posteriorly without pleural fluid. Upper Abdomen: Assessed on abdominal CT yesterday. Excreted IV contrast in bilateral renal collecting systems. Renal cysts partially included. Musculoskeletal: Post median sternotomy. Degenerative change in the spine. There are no acute or suspicious osseous abnormalities. Review of the MIP images confirms the above findings. IMPRESSION: 1. No pulmonary embolus. 2. Mild emphysema with central bronchial thickening. 3. Right apical nodule measuring 6 mm with irregular borders. Non-contrast chest CT at 6-12 months is recommended. If the nodule is stable at time of repeat CT, then future CT at 18-24 months (from today's scan) is considered optional for low-risk patients, but is recommended for high-risk patients. This recommendation follows the consensus statement: Guidelines for Management of Incidental Pulmonary Nodules Detected on CT Images: From the  Fleischner Society 2017; Radiology 2017; 284:228-243. 4. Aortic atherosclerosis without acute aortic abnormality. Coronary artery calcifications post CABG. Aortic Atherosclerosis (ICD10-I70.0) and Emphysema (ICD10-J43.9). Electronically Signed   By: Jeb Levering M.D.   On: 01/05/2017 00:25   Ct Abdomen Pelvis W Contrast  Result Date: 01/04/2017 CLINICAL DATA:  Nausea, onset yesterday. EXAM: CT ABDOMEN AND PELVIS WITH CONTRAST TECHNIQUE: Multidetector CT imaging of the abdomen and pelvis was performed using the standard protocol following bolus administration of intravenous contrast. CONTRAST:  166m ISOVUE-300 IOPAMIDOL (ISOVUE-300) INJECTION 61% COMPARISON:  CT 10/05/2016 FINDINGS: Lower chest: The lung bases are clear. Mitral annulus calcifications. Heart is normal in size. Hepatobiliary: Prominent liver spanning 18.2 cm. Subcentimeter hypodensity in the right dome of the liver, unchanged and likely small cyst. No new focal hepatic lesion. Gallbladder physiologically distended, no calcified stone. No biliary dilatation. Pancreas: Parenchymal atrophy. No ductal dilatation or inflammation. Spleen: Unchanged nonspecific 10 mm hypodensity in the upper central spleen, likely cyst or hemangioma. Adrenals/Urinary Tract: No adrenal nodule. No hydronephrosis or perinephric edema. Cortical scarring in the lower right kidney with adjacent surgical clip. Unchanged 13 mm hypodense lesion in the anterior mid right kidney. Simple cyst in the upper left kidney measuring 4 cm. No new renal lesion. Urinary bladder minimally distended, question of perivesicular stranding. Stomach/Bowel: Stomach is physiologically distended with ingested contents. 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 Appendix is surgically absent. No evidence of bowel wall thickening, distention, or inflammatory changes. Mild distal colonic diverticulosis without diverticulitis. Vascular/Lymphatic:  Moderate to advanced aortic and branch atherosclerosis. No aneurysm. No enlarged abdominal or pelvic lymph nodes. Reproductive: Status post hysterectomy. No adnexal masses. Other: Minimal simple free fluid in the pelvis, increased from prior. No upper abdominal ascites. No free air or intra-abdominal abscess. Musculoskeletal: There are no acute or suspicious osseous abnormalities. Degenerative change in the spine and both sacroiliac joints. IMPRESSION: 1. Minimal pelvic free fluid which  is of doubtful clinical significance. No other acute abnormality or explanation for nausea. 2. Stable chronic findings as described. 3.  Aortic Atherosclerosis (ICD10-I70.0). Electronically Signed   By: Jeb Levering M.D.   On: 01/04/2017 23:25    Microbiology: No results found for this or any previous visit (from the past 240 hour(s)).   Labs: Basic Metabolic Panel: Recent Labs  Lab 01/04/17 1505  NA 144  K 3.9  CL 107  CO2 30  GLUCOSE 85  BUN 11  CREATININE 0.71  CALCIUM 8.7*   Liver Function Tests: No results for input(s): AST, ALT, ALKPHOS, BILITOT, PROT, ALBUMIN in the last 168 hours. No results for input(s): LIPASE, AMYLASE in the last 168 hours. No results for input(s): AMMONIA in the last 168 hours. CBC: Recent Labs  Lab 01/04/17 1505  WBC 6.1  HGB 9.3*  HCT 31.3*  MCV 80.5  PLT 271   Cardiac Enzymes: Recent Labs  Lab 01/05/17 0013 01/05/17 0241 01/05/17 0604 01/05/17 1020  TROPONINI <0.03 <0.03 <0.03 <0.03   BNP: BNP (last 3 results) Recent Labs    01/04/17 1505  BNP 315.0*    ProBNP (last 3 results) Recent Labs    06/21/16 1628  PROBNP 128    CBG: Recent Labs  Lab 01/05/17 2012 01/06/17 0757 01/06/17 1148  GLUCAP 137* 125* 131*       Signed:  Kayleen Memos, MD [facility] 01/06/2017, 2:54 PM

## 2017-01-06 NOTE — Progress Notes (Signed)
Pt discharged home. PIV removed, AVS reviewed. Pt to call to reschedule holter monitor appointment in clinic. Pt left unit via wheelchair with belongings in hand. Pt to be transported home by family.

## 2017-01-06 NOTE — Care Management Obs Status (Signed)
[facility] NOTIFICATION   Patient Details  Name: [name] MRN: [id number] Date of Birth: [birth year]   Medicare Observation Status Notification Given:  Yes    Bethena Roys, RN 01/06/2017, 2:27 PM

## 2017-01-06 NOTE — Discharge Instructions (Signed)
Acute Pain, Adult Acute pain is a type of pain that may last for just a few days or as long as six months. It is often related to an illness, injury, or medical procedure. Acute pain may be mild, moderate, or severe. It usually goes away once your injury has healed or you are no longer ill. Pain can make it hard for you to do daily activities. It can cause anxiety and lead to other problems if left untreated. Treatment depends on the cause and severity of your acute pain. Follow these instructions at home:  Check your pain level as told by your health care provider.  Take over-the-counter and prescription medicines only as told by your health care provider.  If you are taking prescription pain medicine: ? Ask your health care provider about taking a stool softener or laxative to prevent constipation. ? Do not stop taking the medicine suddenly. Talk to your health care provider about how and when to discontinue prescription pain medicine. ? If your pain is severe, do not take more pills than instructed by your health care provider. ? Do not take other over-the-counter pain medicines in addition to this medicine unless told by your health care provider. ? Do not drive or operate heavy machinery while taking prescription pain medicine.  Apply ice or heat as told by your health care provider. These may reduce swelling and pain.  Ask your health care provider if other strategies such as distraction, relaxation, or physical therapies can help your pain.  Keep all follow-up visits as told by your health care provider. This is important. Contact a health care provider if:  You have pain that is not controlled by medicine.  Your pain does not improve or gets worse.  You have side effects from pain medicines, such as vomitingor confusion. Get help right away if:  You have severe pain.  You have trouble breathing.  You lose consciousness.  You have chest pain or pressure that lasts for more  than a few minutes. Along with the chest pain you may: ? Have pain or discomfort in one or both arms, your back, neck, jaw, or stomach. ? Have shortness of breath. ? Break out in a cold sweat. ? Feel nauseous. ? Become light-headed. These symptoms may represent a serious problem that is an emergency. Do not wait to see if the symptoms will go away. Get medical help right away. Call your local emergency services (911 in the [place] Do not drive yourself to the hospital. This information is not intended to replace advice given to you by your health care provider. Make sure you discuss any questions you have with your health care provider. Document Released: 02/09/2015 Document Revised: 07/04/2015 Document Reviewed: 02/09/2015 Elsevier Interactive Patient Education  2018 [place].   Chest Wall Pain Chest wall pain is pain in or around the bones and muscles of your chest. Sometimes, an injury causes this pain. Sometimes, the cause may not be known. This pain may take several weeks or longer to get better. Follow these instructions at home: Pay attention to any changes in your symptoms. Take these actions to help with your pain:  Rest as told by your doctor.  Avoid activities that cause pain. Try not to use your chest, belly (abdominal), or side muscles to lift heavy things.  If directed, apply ice to the painful area: ? Put ice in a plastic bag. ? Place a towel between your skin and the bag. ? Leave the ice on  for 20 minutes, 2-3 times per day.  Take over-the-counter and prescription medicines only as told by your doctor.  Do not use tobacco products, including cigarettes, chewing tobacco, and e-cigarettes. If you need help quitting, ask your doctor.  Keep all follow-up visits as told by your doctor. This is important.  Contact a doctor if:  You have a fever.  Your chest pain gets worse.  You have new symptoms. Get help right away if:  You feel sick to your stomach (nauseous)  or you throw up (vomit).  You feel sweaty or light-headed.  You have a cough with phlegm (sputum) or you cough up blood.  You are short of breath. This information is not intended to replace advice given to you by your health care provider. Make sure you discuss any questions you have with your health care provider. Document Released: 07/14/2007 Document Revised: 07/03/2015 Document Reviewed: 04/22/2014 Elsevier Interactive Patient Education  [employer].

## 2017-01-07 ENCOUNTER — Telehealth: Payer: Self-pay

## 2017-01-07 ENCOUNTER — Telehealth: Payer: Self-pay | Admitting: Cardiovascular Disease

## 2017-01-07 NOTE — Telephone Encounter (Signed)
Pt on TCM List. Dc'ed on 01/06/2017.   [name] Havey Ratliffis a 76 y.o.femalewith medical history significant ofCAD, DM, HTN, CABG x3. Patient started having chest pain on 12/10/16. Got heart cath on 12/14/16:They recd medical management. Patient with increasing CP and SOB on exertion over past couple of weeks since that time. Symptoms are relieved rapidly by NTG SL. Patient returns to ED for worsening symptoms. Upon presentation to the ED, Trop neg. CTA neg for PE. Cardiology followed the patient and recommended follow up as outpatient. No further workup in the hospital setting.  On the day of discharge the pt was hemodynamically stable. She reported chest wall pain improved to 3/10.  Patient will need to follow up with her cardiologist within a week.

## 2017-01-07 NOTE — Telephone Encounter (Signed)
New Message  Pt call requesting to speak with RN to reschedule event monitor appt for a sooner appt than next available on 12/12. Please call back to discuss

## 2017-01-07 NOTE — Telephone Encounter (Signed)
Spoke with Dr. Acie Fredrickson to confirm that it is permissible for patient to wait for opening on monitor schedule. I called patient and explained that monitor schedule is full and we will have to wait for cancellation. I advised that she should monitor her BP and if it is low, to call back to let us know and also to monitor for symptoms of pre-syncope. She verbalized understanding and agreement with plan and thanked me for the call.

## 2017-01-07 NOTE — Telephone Encounter (Signed)
Per Dr. Acie Fredrickson, patient was hospitalized on 11/29 when she was supposed to come into the office for a monitor. I am routing message to Abram Sander and Markus Daft, our monitor technicians for a sooner appointment.

## 2017-01-10 ENCOUNTER — Encounter: Payer: Self-pay | Admitting: Cardiology

## 2017-01-10 ENCOUNTER — Telehealth: Payer: Self-pay

## 2017-01-10 NOTE — Telephone Encounter (Signed)
In reviewing patients chart for Endoscopy scheduled for 01/12/17 I noticed that the patient was seen in the ED on 01/04/17 for chest pain. The ED visit happened after her pre-visit on 12/28/16. Patient is scheduled for a heart monitor test on 01/24/17. Is it ok for the patient to keep her scheduled Endoscopy or should she be rescheduled until after her heart monitor test? Please advise. Thanks.   Riki Sheer, LPN

## 2017-01-11 NOTE — Telephone Encounter (Signed)
OK to keep EGD appt. She was evaluated by Cardiology during recent admission.

## 2017-01-12 ENCOUNTER — Ambulatory Visit (AMBULATORY_SURGERY_CENTER): Payer: Medicare Other | Admitting: Gastroenterology

## 2017-01-12 ENCOUNTER — Encounter: Payer: Self-pay | Admitting: Gastroenterology

## 2017-01-12 ENCOUNTER — Other Ambulatory Visit: Payer: Self-pay

## 2017-01-12 VITALS — BP 142/78 | HR 80 | Temp 98.4°F | Resp 13 | Ht 62.0 in | Wt 211.0 lb

## 2017-01-12 DIAGNOSIS — R1310 Dysphagia, unspecified: Secondary | ICD-10-CM

## 2017-01-12 DIAGNOSIS — K219 Gastro-esophageal reflux disease without esophagitis: Secondary | ICD-10-CM

## 2017-01-12 MED ORDER — SODIUM CHLORIDE 0.9 % IV SOLN: 500.0000 mL | Freq: Once | INTRAVENOUS | Status: DC

## 2017-01-12 NOTE — Patient Instructions (Addendum)
YOU HAD AN ENDOSCOPIC PROCEDURE TODAY AT [facility]:   Refer to the procedure report that was given to you for any specific questions about what was found during the examination.  If the procedure report does not answer your questions, please call your gastroenterologist to clarify.  If you requested that your care partner not be given the details of your procedure findings, then the procedure report has been included in a sealed envelope for you to review at your convenience later.  YOU SHOULD EXPECT: Some feelings of bloating in the abdomen. Passage of more gas than usual.  Walking can help get rid of the air that was put into your GI tract during the procedure and reduce the bloating. If you had a lower endoscopy (such as a colonoscopy or flexible sigmoidoscopy) you may notice spotting of blood in your stool or on the toilet paper. If you underwent a bowel prep for your procedure, you may not have a normal bowel movement for a few days.  Please Note:  You might notice some irritation and congestion in your nose or some drainage.  This is from the oxygen used during your procedure.  There is no need for concern and it should clear up in a day or so.  SYMPTOMS TO REPORT IMMEDIATELY:    Following upper endoscopy (EGD)  Vomiting of blood or coffee ground material  New chest pain or pain under the shoulder blades  Painful or persistently difficult swallowing  New shortness of breath  Fever of 100F or higher  Black, tarry-looking stools  For urgent or emergent issues, a gastroenterologist can be reached at any hour by calling [phone].   DIET: FOLLOW DILATION HANDOUT.  ACTIVITY:  You should plan to take it easy for the rest of today and you should NOT DRIVE or use heavy machinery until tomorrow (because of the sedation medicines used during the test).    FOLLOW UP: Our staff will call the number listed on your records the next business day following your procedure to  check on you and address any questions or concerns that you may have regarding the information given to you following your procedure. If we do not reach you, we will leave a message.  However, if you are feeling well and you are not experiencing any problems, there is no need to return our call.  We will assume that you have returned to your regular daily activities without incident.  If any biopsies were taken you will be contacted by phone or by letter within the next 1-3 weeks.  Please call us at [phone] if you have not heard about the biopsies in 3 weeks.    SIGNATURES/CONFIDENTIALITY: You and/or your care partner have signed paperwork which will be entered into your electronic medical record.  These signatures attest to the fact that that the information above on your After Visit Summary has been reviewed and is understood.  Full responsibility of the confidentiality of this discharge information lies with you and/or your care-partner.   Resume Plavix at prior dose tomorrow,continue on remainder of medication. Information given os stricture and dilation diet.

## 2017-01-12 NOTE — Op Note (Signed)
[facility] Patient Name: [name] Procedure Date: 01/12/2017 2:35 PM MRN: [id number] Endoscopist: Ladene Artist , MD Age: 76 Referring MD:  Date of Birth: Jan 30, 1941 Gender: Female Account #: [id number] Procedure:                Upper GI endoscopy Indications:              Dysphagia Medicines:                Monitored Anesthesia Care Procedure:                Pre-Anesthesia Assessment:                           - Prior to the procedure, a History and Physical                            was performed, and patient medications and                            allergies were reviewed. The patient's tolerance of                            previous anesthesia was also reviewed. The risks                            and benefits of the procedure and the sedation                            options and risks were discussed with the patient.                            All questions were answered, and informed consent                            was obtained. Prior Anticoagulants: The patient has                            taken Plavix (clopidogrel), last dose was 6 days                            prior to procedure. ASA Grade Assessment: III - A                            patient with severe systemic disease. After                            reviewing the risks and benefits, the patient was                            deemed in satisfactory condition to undergo the                            procedure.  After obtaining informed consent, the endoscope was                            passed under direct vision. Throughout the                            procedure, the patient's blood pressure, pulse, and                            oxygen saturations were monitored continuously. The                            Endoscope was introduced through the mouth, and                            advanced to the second part of duodenum. The upper                            GI  endoscopy was accomplished without difficulty.                            The patient tolerated the procedure well. Scope In: Scope Out: Findings:                 One mild benign-appearing, intrinsic stenosis was                            found at the gastroesophageal junction. This                            measured 1.3 cm (inner diameter) and was traversed.                            A guidewire was placed and the scope was withdrawn.                            Dilations were performed with Savary dilators with                            mild resistance at 14 mm, 15 mm and 16 mm.                           The exam of the esophagus was otherwise normal.                           A medium-sized, submucosal, partially                            circumferential (involving one-third of the lumen                            circumference) mass with no bleeding and no  stigmata of recent bleeding was found in the                            gastric antrum and in the prepyloric region of the                            stomach.                           The exam of the stomach was otherwise normal.                            Retroflexed photo of the cardia did not capture.                           The duodenal bulb and second portion of the                            duodenum were normal. Complications:            No immediate complications. Estimated Blood Loss:     Estimated blood loss: none. Impression:               - Benign-appearing esophageal stenosis. Dilated.                           - Likely benign gastric submucosal lesion in the                            gastric antrum and in the prepyloric region of the                            stomach.                           - Normal duodenal bulb and second portion of the                            duodenum.                           - No specimens collected. Recommendation:           - Patient has a contact  number available for                            emergencies. The signs and symptoms of potential                            delayed complications were discussed with the                            patient. Return to normal activities tomorrow.                            Written discharge instructions were provided to the  patient.                           - Clear liquid diet for 2 hours, advance as                            tolerated to soft diet today. Resume prior diet                            tomorrow.                           - Continue present medications.                           - Resume Plavix (clopidogrel) at prior dose                            tomorrow. Refer to managing physician for further                            adjustment of therapy.                           - Consider EUS to further evaluate the gastric                            submucosal lesion. Will review with Dr. Ardis Hughs. Ladene Artist, MD 01/12/2017 3:07:54 PM This report has been signed electronically.

## 2017-01-12 NOTE — Progress Notes (Signed)
Called to room to assist during endoscopic procedure.  Patient ID and intended procedure confirmed with present staff. Received instructions for my participation in the procedure from the performing physician.  

## 2017-01-12 NOTE — Progress Notes (Signed)
A/ox3, pleased with MAC, report to Sheila RN 

## 2017-01-12 NOTE — Progress Notes (Signed)
Pt. Had to call for ride,placed in consultation room until ride arrives.

## 2017-01-13 ENCOUNTER — Telehealth: Payer: Self-pay | Admitting: *Deleted

## 2017-01-13 ENCOUNTER — Telehealth: Payer: Self-pay

## 2017-01-13 NOTE — Progress Notes (Addendum)
Subjective:   [name] is a 76 y.o. female who presents for an Initial Medicare Annual Wellness Visit.  Patient states that she wants to stop taking any medication that she can, she feels off balance with her gait and is light headed at times. Her neck and throat starts to hurt after talking for about 10 minutes and it becomes hard to pronounce words.   Review of Systems    No ROS.  Medicare Wellness Visit. Additional risk factors are reflected in the social history.   Cardiac Risk Factors include: advanced age (>97mn, >>22women);diabetes mellitus;dyslipidemia;hypertension;obesity (BMI >30kg/m2) Sleep patterns: has interrupted sleep, gets up 2 times nightly to void and sleeps 4-5 hours nightly. Patient reports insomnia issues, discussed recommended sleep tips and stress reduction tips.   Home Safety/Smoke Alarms: Feels safe in home. Smoke alarms in place.  Living environment; residence and Firearm Safety: 1-story house/ trailer, no firearms. Lives alone, no needs for DME, good support system Seat Belt Safety/Bike Helmet: Wears seat belt.     Objective:    Today's Vitals   01/14/17 1642  BP: (!) 147/84  Pulse: 82  Weight: 205 lb (93 kg)  Height: '5\' 3"'  (1.6 m)   Body mass index is 36.31 kg/m.  Advanced Directives 01/14/2017 01/12/2017 01/06/2017 01/04/2017 12/14/2016 10/19/2016 03/18/2016  Does Patient Have a Medical Advance Directive? Yes Yes No No No Yes Yes  Type of AParamedicof ALa FontaineLiving will - - - - - Living will  Does patient want to make changes to medical advance directive? - - - - - No - Patient declined No - Patient declined  Copy of [place] Chart? No - copy requested - - - - - -  Would patient like information on creating a medical advance directive? - - No - Patient declined - No - Patient declined - No - Patient declined  Pre-existing out of facility DNR order (yellow form or pink MOST form) - - - - - - -     Current Medications (verified) Outpatient Encounter Medications as of 01/14/2017  Medication Sig  . albuterol (PROVENTIL HFA;VENTOLIN HFA) 108 (90 BASE) MCG/ACT inhaler Inhale 2 puffs into the lungs every 6 (six) hours as needed. For shortness of breath.  .Marland Kitchenalum & mag hydroxide-simeth (MAALOX/MYLANTA) 200-200-20 MG/5ML suspension Take 30 mLs as needed by mouth for indigestion or heartburn.  .Marland Kitchenaspirin EC 81 MG tablet Take 1 tablet (81 mg total) by mouth daily.  . baclofen (LIORESAL) 20 MG tablet Take 20 mg 2 (two) times daily by mouth.  . benzonatate (TESSALON) 100 MG capsule Take 100 mg by mouth 2 (two) times daily as needed for cough.   . Blood Glucose Monitoring Suppl (ONE TOUCH ULTRA MINI) w/Device KIT 3 (three) times daily. for testing  . budesonide-formoterol (SYMBICORT) 160-4.5 MCG/ACT inhaler Inhale 2 puffs into the lungs 2 (two) times daily. (Patient taking differently: Inhale 2 puffs 2 (two) times daily as needed into the lungs (shortness of breath). )  . calcium carbonate (TUMS - DOSED IN MG ELEMENTAL CALCIUM) 500 MG chewable tablet Chew 2 tablets daily as needed by mouth for indigestion or heartburn.  . Cholecalciferol (VITAMIN D) 2000 UNITS tablet Take 2,000 Units by mouth daily.   . clonazePAM (KLONOPIN) 0.5 MG tablet Take 0.5 mg daily by mouth.  . clopidogrel (PLAVIX) 75 MG tablet Take 1 tablet (75 mg total) by mouth daily.  . Cyanocobalamin (B-12 COMPLIANCE INJECTION) 1000 MCG/ML KIT Inject  1,000 mcg every 30 (thirty) days as directed.  . cyclobenzaprine (FLEXERIL) 5 MG tablet TAKE 1 TABLET BY MOUTH AT BEDTIME  . diclofenac sodium (VOLTAREN) 1 % GEL Apply 2 g topically every 4 (four) hours. (Patient taking differently: Apply 2 g every 4 (four) hours as needed topically (muscle pain). )  . diltiazem (CARDIZEM CD) 240 MG 24 hr capsule Take 1 capsule (240 mg total) by mouth daily.  . fexofenadine (ALLEGRA) 180 MG tablet Take 1 tablet (180 mg total) by mouth daily.  . furosemide  (LASIX) 40 MG tablet Take 40 mg every other day by mouth.   . gabapentin (NEURONTIN) 100 MG capsule Take 300 mg at night (Patient taking differently: Take 300 mg by mouth at bedtime. )  . insulin lispro protamine-insulin lispro (HUMALOG 75/25) (75-25) 100 UNIT/ML SUSP Inject 20-30 Units into the skin See admin instructions. Takes 20 units in the morning and 30  units with supper  . isosorbide mononitrate (IMDUR) 60 MG 24 hr tablet Take 1.5 tablets (90 mg total) by mouth daily.  Marland Kitchen loperamide (IMODIUM) 2 MG capsule Take 2 mg by mouth as needed for diarrhea or loose stools.   . meclizine (ANTIVERT) 25 MG tablet Take 25 mg by mouth 2 (two) times daily as needed for dizziness.   . methocarbamol (ROBAXIN) 500 MG tablet Take 500 mg 2 (two) times daily by mouth.  . metoprolol succinate (TOPROL-XL) 50 MG 24 hr tablet Take 1 tablet (50 mg total) by mouth daily.  . montelukast (SINGULAIR) 5 MG chewable tablet Chew 1 tablet (5 mg total) by mouth at bedtime.  . Multiple Vitamin (MULTIVITAMIN WITH MINERALS) TABS Take 1 tablet by mouth daily.  . Nebulizers (COMPRESSOR/NEBULIZER) MISC Use with albuterol  . nitroGLYCERIN (NITROSTAT) 0.4 MG SL tablet Place 1 tablet (0.4 mg total) under the tongue every 5 (five) minutes as needed. (Patient taking differently: Place 0.4 mg every 5 (five) minutes as needed under the tongue for chest pain. )  . ONE TOUCH ULTRA TEST test strip CHECK BLOOD SUGAR TWICE A DAY DX: E11.9  . ONETOUCH DELICA LANCETS 16X MISC 3 (three) times daily. for testing  . Pancrelipase, Lip-Prot-Amyl, 24000 units CPEP Take 2 capsules (48,000 Units total) by mouth 3 (three) times daily with meals. Take 1 capsule with a snack  . pantoprazole (PROTONIX) 40 MG tablet Take 1 tablet (40 mg total) by mouth 2 (two) times daily before a meal.  . potassium chloride SA (K-DUR,KLOR-CON) 20 MEQ tablet Take 20 mEq daily by mouth.   . pravastatin (PRAVACHOL) 20 MG tablet Take 1 tablet (20 mg total) every evening by  mouth.  . Spacer/Aero-Holding Chambers (AEROCHAMBER MV) inhaler Use as instructed  . traZODone (DESYREL) 50 MG tablet Take 1-2 tablets (50-100 mg total) at bedtime as needed by mouth for sleep.   Facility-Administered Encounter Medications as of 01/14/2017  Medication  . 0.9 %  sodium chloride infusion  . cyanocobalamin ((VITAMIN B-12)) injection 1,000 mcg    Allergies (verified) Amlodipine; Banana; Co q10 [coenzyme q10]; Codeine; Morphine; Repatha [evolocumab]; Statins; and Sulfonamide derivatives   History: Past Medical History:  Diagnosis Date  . Adenomatous colon polyp   . Allergy   . Anxiety   . Asthma   . Chest pain   . COPD (chronic obstructive pulmonary disease) ([place])    pt is unsure if has been officially diagnosed  . Coronary artery disease    CABG '09- cathed 12/09, 9/10, 6/11, 3/14 and 12/13/16- medical Rx  . Diabetes  mellitus   . GERD (gastroesophageal reflux disease)   . Hiatal hernia   . Hyperlipidemia   . Hypertension   . Myocardial infarction ([place]) 2009  . PONV (postoperative nausea and vomiting)   . Schatzki's ring   . Shoulder injury    resolved after shoulder surgery  . Sleep apnea    not on cpap   Past Surgical History:  Procedure Laterality Date  . ABDOMINAL HYSTERECTOMY    . APPENDECTOMY     came out with Hysterectomy  . CARDIAC CATHETERIZATION  07/23/2009   EF 60%  . CARDIAC CATHETERIZATION  10/11/2008  . CARDIAC CATHETERIZATION  03/01/2007   EF 75-80%  . CARDIOVASCULAR STRESS TEST  11/15/2007   EF 60%  . COLONOSCOPY    . CORONARY ARTERY BYPASS GRAFT     SEVERELY DISEASED SAPHENOUS VEIN GRAFT TO THE RIGHT CORONARY ARTERY BUT WITH FAIRLY WELL PRESERVED FLOW TO THE DISTAL RIGHT CORONARY ARTERY FROM THE NATIVE CIRCULATION-RESTART  CATH IN JUNE 2000, REVEALS MILD/MODERATE  CAD WITH GOOD FLOW DOWN HER LAD  . ESOPHAGOGASTRODUODENOSCOPY    . EYE SURGERY     bilateral cataract surgery with lens implant  . LEFT HEART CATH AND CORS/GRAFTS ANGIOGRAPHY  N/A 12/14/2016   Procedure: LEFT HEART CATH AND CORS/GRAFTS ANGIOGRAPHY;  Surgeon: Jettie Booze, MD;  Location: [facility];  Service: Cardiovascular;  Laterality: N/A;  . lense removal Left   . POLYPECTOMY    . ROTATOR CUFF REPAIR     right and left  . TONSILLECTOMY     age 54  . 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 tumor removed kidney    . UPPER GASTROINTESTINAL ENDOSCOPY    . US ECHOCARDIOGRAPHY  03/08/2008   EF 55-60%   Family History  Problem Relation Age of Onset  . Heart disease Maternal Grandfather   . Heart failure Maternal Grandfather   . Diabetes Maternal Grandfather   . Heart attack Father   . Diabetes Mother   . Rheum arthritis Sister   . Emphysema Paternal Uncle   . Esophageal cancer Brother 82       she said he was born with it  . Emphysema Paternal Aunt   . Neuropathy Neg Hx   . Multiple sclerosis Neg Hx   . Colon cancer Neg Hx   . Colon polyps Neg Hx   . Rectal cancer Neg Hx   . Stomach cancer Neg Hx    Social History   Socioeconomic History  . Marital status: Widowed    Spouse name: None  . Number of children: 4  . Years of education: Designer, jewellery  . Highest education level: None  Social Needs  . Financial resource strain: Not hard at all  . Food insecurity - worry: Never true  . Food insecurity - inability: Never true  . Transportation needs - medical: No  . Transportation needs - non-medical: No  Occupational History  . Occupation: Retired  Tobacco Use  . Smoking status: Former Smoker    Packs/day: 0.50    Years: 21.00    Pack years: 10.50    Types: Cigarettes    Start date: 02/09/1955    Last attempt to quit: 02/09/1976    Years since quitting: 40.9  . Smokeless tobacco: Never Used  Substance and Sexual Activity  . Alcohol use: No    Alcohol/week:  0.0 oz  . Drug use: No  . Sexual  activity: No  Other Topics Concern  . None  Social History Narrative   Lives alone.   Caffeine use: Drinks 1 cup coffee/day      Originally from [place]. Previously has lived in [place]. Prior travel to [place], [place], [place], [place], [place], [place], [place], & [place]. Previously worked in [employer] She has a dog currently. No bird, mold, or hot tub exposure. She also pastors a church.     Tobacco Counseling Counseling given: Not Answered  Activities of Daily Living In your present state of health, do you have any difficulty performing the following activities: 01/14/2017 01/06/2017  Hearing? N N  Vision? N N  Difficulty concentrating or making decisions? N N  Walking or climbing stairs? Y N  Comment - -  Dressing or bathing? N N  Doing errands, shopping? N N  Preparing Food and eating ? N -  Using the Toilet? N -  In the past six months, have you accidently leaked urine? N -  Do you have problems with loss of bowel control? N -  Managing your Medications? N -  Managing your Finances? N -  Housekeeping or managing your Housekeeping? N -  Some recent data might be hidden   Immunizations and Health Maintenance Immunization History  Administered Date(s) Administered  . Influenza Split 12/15/2011, 11/08/2012, 11/19/2014  . Influenza Whole 10/09/2008  . Influenza, High Dose Seasonal PF 11/19/2015, 11/23/2016  . Influenza-Unspecified 10/29/2013  . Pneumococcal Conjugate-13 09/03/2015  . Pneumococcal Polysaccharide-23 02/09/2004  . Pneumococcal-Unspecified 02/09/2012   Health Maintenance Due  Topic Date Due  . FOOT EXAM  09/30/1950  . OPHTHALMOLOGY EXAM  09/30/1950  . TETANUS/TDAP  09/30/1957  . DEXA SCAN  10/01/2003    Patient Care Team: Binnie Rail, MD as PCP - General (Internal Medicine) Elsie Stain, MD as Attending Physician (Pulmonary Disease) Nahser, Wonda Cheng, MD as Consulting Physician (Cardiology) Ladene Artist, MD as Consulting Physician  (Gastroenterology) Javier Glazier, MD as Consulting Physician (Pulmonary Disease)  Indicate any recent Medical Services you may have received from other than [facility] providers in the past year (date may be approximate).     Assessment:   This is a routine wellness examination for Martena. Physical assessment deferred to PCP.   Hearing/Vision screen Hearing Screening Comments: Able to hear conversational tones w/o difficulty. No issues reported.  Passed whisper test  Vision Screening Comments: appointment yearly Dr. Elmer Sow  Dietary issues and exercise activities discussed: Current Exercise Habits: Structured exercise class, Type of exercise: walking, Time (Minutes): 40, Frequency (Times/Week): 2, Weekly Exercise (Minutes/Week): 80, Intensity: Mild, Exercise limited by: orthopedic condition(s)  Diet (meal preparation, eat out, water intake, caffeinated beverages, dairy products, fruits and vegetables): in general, a "healthy" diet  , well balanced   Reviewed heart healthy and diabetic diet, encouraged patient to increase daily water intake.  Goals    . Patient Stated     Take time for myself to rest mind, body and spirit. I will call my pastor friend to ask her to preside over my services for the next 2 weeks.       Depression Screen PHQ 2/9 Scores 01/14/2017 11/23/2016 04/22/2015  PHQ - 2 Score 2 0 0  PHQ- 9 Score 6 - -    Fall Risk Fall Risk  01/14/2017 11/23/2016 04/22/2015  Falls in the past year? Yes No Yes  Number falls in past yr: 2 or more -  2 or more  Injury with Fall? No - No  Risk Factor Category  High Fall Risk - -  Risk for fall due to : Impaired balance/gait - Impaired balance/gait  Follow up Falls prevention discussed;Education provided - -     Cognitive Function: MMSE - Mini Mental State Exam 01/14/2017  Orientation to time 5  Orientation to Place 5  Registration 3  Attention/ Calculation 5  Recall 1  Language- name 2 objects 2  Language- repeat 1  Language-  follow 3 step command 3  Language- read & follow direction 1  Write a sentence 1  Copy design 1  Total score 28        Screening Tests Health Maintenance  Topic Date Due  . FOOT EXAM  09/30/1950  . OPHTHALMOLOGY EXAM  09[birth year]30/1948  . TETANUS/TDAP  09/30/1957  . DEXA SCAN  10/01/2003  . URINE MICROALBUMIN  05/19/2017  . INFLUENZA VACCINE  Completed  . PNA vac Low Risk Adult  Completed     Plan:     Scheduled a PCP appointment 01/18/17 to discuss concerns listed above.  Continue doing brain stimulating activities (puzzles, reading, adult coloring books, staying active) to keep memory sharp.   Continue to eat heart healthy diet (full of fruits, vegetables, whole grains, lean protein, water--limit salt, fat, and sugar intake) and increase physical activity as tolerated.  I have personally reviewed and noted the following in the patient's chart:   . Medical and social history . Use of alcohol, tobacco or illicit drugs  . Current medications and supplements . Functional ability and status . Nutritional status . Physical activity . Advanced directives . List of other physicians . Vitals . Screenings to include cognitive, depression, and falls . Referrals and appointments  In addition, I have reviewed and discussed with patient certain preventive protocols, quality metrics, and best practice recommendations. A written personalized care plan for preventive services as well as general preventive health recommendations were provided to patient.     Michiel Cowboy, RN   01/14/2017     Medical screening examination/treatment/procedure(s) were performed by non-physician practitioner and as supervising physician I was immediately available for consultation/collaboration. I agree with above. Binnie Rail, MD

## 2017-01-13 NOTE — Telephone Encounter (Signed)
  Follow up Call-  Call back number 01/12/2017 09/10/2015 01/08/2015  Post procedure Call Back phone  # [phone]  Permission to leave phone message Yes Yes Yes  Some recent data might be hidden     Patient questions:  Do you have a fever, pain , or abdominal swelling? No. Pain Score  0 *  Have you tolerated food without any problems? Yes.    Have you been able to return to your normal activities? Yes.    Do you have any questions about your discharge instructions: Diet   No. Medications  No. Follow up visit  No.  Do you have questions or concerns about your Care? No.  Actions: * If pain score is 4 or above: No action needed, pain <4.

## 2017-01-13 NOTE — Telephone Encounter (Signed)
-----   Message from Ladene Artist, MD sent at 01/12/2017  6:58 PM EST ----- Regarding: RE: DJ, I'm fine going with the CT reading if you are. Thanks. MS  Jarae Panas,  Please call the patient and left her know the CT evaluation will suffice and EUS is not needed. 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  Looking back at CT in earlier 2018 and also in 2014 I see the same structure (not described by radiology though in either of those scans) and it looks the same size to me.  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  DJ     ----- Message ----- From: Ladene Artist, MD Sent: 01/12/2017   4:00 PM To: Milus Banister, MD  Linna Hoff,   Would you recommend proceeding with EUS to evaluate the smooth submucosal antral lesion noted on EGD today and on EGD in 2016?    Norberto Sorenson

## 2017-01-13 NOTE — Telephone Encounter (Signed)
  Follow up Call-  Call back number 01/12/2017 09/10/2015 01/08/2015  Post procedure Call Back phone  # [phone]  Permission to leave phone message Yes Yes Yes  Some recent data might be hidden     No answer left message to call if questions or concerns.

## 2017-01-13 NOTE — Telephone Encounter (Signed)
Patient notified of the recommendations she will call back for any additional questions or concerns. [name]

## 2017-01-14 ENCOUNTER — Ambulatory Visit (INDEPENDENT_AMBULATORY_CARE_PROVIDER_SITE_OTHER): Payer: Medicare Other | Admitting: *Deleted

## 2017-01-14 VITALS — BP 147/84 | HR 82 | Ht 63.0 in | Wt 205.0 lb

## 2017-01-14 DIAGNOSIS — Z0000 Encounter for general adult medical examination without abnormal findings: Secondary | ICD-10-CM | POA: Diagnosis not present

## 2017-01-14 DIAGNOSIS — E1059 Type 1 diabetes mellitus with other circulatory complications: Secondary | ICD-10-CM

## 2017-01-14 DIAGNOSIS — E2839 Other primary ovarian failure: Secondary | ICD-10-CM | POA: Diagnosis not present

## 2017-01-14 NOTE — Patient Instructions (Addendum)
Continue doing brain stimulating activities (puzzles, reading, adult coloring books, staying active) to keep memory sharp.   Continue to eat heart healthy diet (full of fruits, vegetables, whole grains, lean protein, water--limit salt, fat, and sugar intake) and increase physical activity as tolerated.  [name] , Thank you for taking time to come for your Medicare Wellness Visit. I appreciate your ongoing commitment to your health goals. Please review the following plan we discussed and let me know if I can assist you in the future.   These are the goals we discussed: Goals    . Patient Stated     Take time for yourself to rest mind, body and spirit. I will call my pastor friend to ask her to preside over my services for the next 2 weeks.        This is a list of the screening recommended for you and due dates:  Health Maintenance  Topic Date Due  . Complete foot exam   09/30/1950  . Eye exam for diabetics  09/30/1948  . Tetanus Vaccine  09/30/1957  . DEXA scan (bone density measurement)  10/01/2003  . Urine Protein Check  05/19/2017  . Flu Shot  Completed  . Pneumonia vaccines  Completed   It is important to avoid accidents which may result in broken bones.  Here are a few ideas on how to make your home safer so you will be less likely to trip or fall.  1. Use nonskid mats or non slip strips in your shower or tub, on your bathroom floor and around sinks.  If you know that you have spilled water, wipe it up! 2. In the bathroom, it is important to have properly installed grab bars on the walls or on the edge of the tub.  Towel racks are NOT strong enough for you to hold onto or to pull on for support. 3. Stairs and hallways should have enough light.  Add lamps or night lights if you need ore light. 4. It is good to have handrails on both sides of the stairs if possible.  Always fix broken handrails right away. 5. It is important to see the edges of steps.  Paint the edges of outdoor  steps white so you can see them better.  Put colored tape on the edge of inside steps. 6. Throw-rugs are dangerous because they can slide.  Removing the rugs is the best idea, but if they must stay, add adhesive carpet tape to prevent slipping. 7. Do not keep things on stairs or in the halls.  Remove small furniture that blocks the halls as it may cause you to trip.  Keep telephone and electrical cords out of the way where you walk. 8. Always were sturdy, rubber-soled shoes for good support.  Never wear just socks, especially on the stairs.  Socks may cause you to slip or fall.  Do not wear full-length housecoats as you can easily trip on the bottom.  9. Place the things you use the most on the shelves that are the easiest to reach.  If you use a stepstool, make sure it is in good condition.  If you feel unsteady, DO NOT climb, ask for help. 10. If a health professional advises you to use a cane or walker, do not be ashamed.  These items can keep you from falling and breaking your bones.

## 2017-01-18 ENCOUNTER — Ambulatory Visit: Payer: Medicare Other | Admitting: Internal Medicine

## 2017-01-18 ENCOUNTER — Other Ambulatory Visit: Payer: Medicare Other

## 2017-01-19 ENCOUNTER — Telehealth: Payer: Self-pay | Admitting: *Deleted

## 2017-01-19 ENCOUNTER — Inpatient Hospital Stay: Admission: RE | Admit: 2017-01-19 | Payer: Medicare Other | Source: Ambulatory Visit

## 2017-01-19 NOTE — Telephone Encounter (Signed)
  Follow up Call-  Call back number 01/12/2017 09/10/2015 01/08/2015  Post procedure Call Back phone  # [phone]  Permission to leave phone message Yes Yes Yes  Some recent data might be hidden     Patient questions:  Do you have a fever, pain , or abdominal swelling? No. Pain Score  0 *  Have you tolerated food without any problems? Yes.    Have you been able to return to your normal activities? Yes.    Do you have any questions about your discharge instructions: Diet   No. Medications  No. Follow up visit  No.  Do you have questions or concerns about your Care? No.  Actions: * If pain score is 4 or above: No action needed, pain <4.

## 2017-01-24 ENCOUNTER — Ambulatory Visit (INDEPENDENT_AMBULATORY_CARE_PROVIDER_SITE_OTHER): Payer: Medicare Other

## 2017-01-24 DIAGNOSIS — R55 Syncope and collapse: Secondary | ICD-10-CM

## 2017-01-24 NOTE — Progress Notes (Signed)
Subjective:    Patient ID: [name], female    DOB: 09/30/1940, 77 y.o.   MRN: [id number]  HPI The patient is here for follow up.  She is here primarily to discuss stopping some of her medications. She would like to see if there are any medications she can discontinue.  She is not currently taking the pravastatin-does cause muscle aches.  She is not able to take any statins.  Headaches, chronic neck pain: her pain starts at the base of the skull and radiates up the right side of her head.  PT did not help. Two months ago we started gabapentin 300 mg at night.  It makes her too drowsy and wonders about lowering the dose.  She is still having headaches - she woke up with a headache this morning.  She thinks the gabapentin has helped a little and does want to continue it.  She does see neurology next month to discuss this further.  Insomnia:  We started the gabapentin 300 mg at night in hopes it would help her headaches and her sleep.  She is also taking trazodone nightly.  She does not always sleep well.  She often wakes up with cramping at night.  Muscle cramping:  She has cramping every other night and during the day.  She is drinking a lot of water.  She has cramping in her ribs and side during the day.  At night it is mostly in her thighs.  She is unsure of the cause.  She is taking her calcium and vitamin D daily.  She has been on more than one different muscle relaxant.  She is following with orthopedics for left thigh pain-cause unknown.  They are currently prescribing the muscle relaxants.  She also states gait disturbance, lightheadedness.   She did see neurology on 03/09/17.  Hyperlipidemia: She has not been able to tolerate more than 1 statin.  She was also started on Repatha by cardiology and did not tolerate that.  She is not currently exercising.  She is concerned about her risk for having heart attack and stroke.  Diabetes, type I: She is following with endocrine.  Her  insulin had to be ingested recently-increased the dose.  Her sugars have been better controlled.  She is not currently exercising.  Medications and allergies reviewed with patient and updated if appropriate.  Patient Active Problem List   Diagnosis Date Noted  . Angina pectoris ([place]) 01/05/2017  . Insomnia 11/23/2016  . Chronic nonintractable headache 11/23/2016  . Pancreatic insufficiency 10/28/2016  . Carotid artery disease ([place]) 05/19/2016  . Fatigue 05/19/2016  . Anemia 03/27/2016  . Severe persistent asthma 03/18/2016  . Type 1 diabetes mellitus ([place]) 07/21/2015  . B12 deficiency 03/31/2015  . OSA (obstructive sleep apnea) 11/20/2014  . Fecal incontinence 08/28/2014  . 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 Falls 08/28/2014  . Neck pain of over 3 months duration 08/28/2014  . H/O total knee replacement 02/20/2014  . Obesity (BMI 30-39.9) 11/12/2013  . Chronic diastolic CHF (congestive heart failure) ([place]) 11/17/2012  . Meniscus, lateral, anterior horn derangement 11/10/2011  . Medial meniscus, posterior horn derangement 11/10/2011  . Osteoarthritis of right knee 11/10/2011  . Vertigo 10/05/2011  . HTN (hypertension), benign 10/05/2011  . Coronary artery disease 08/27/2010  . MUSCLE CRAMPS 12/29/2009  . History of myocardial infarction 11/19/2009  . Extrinsic asthma 11/19/2009  . GERD 11/19/2009  . Hyperlipidemia 11/18/2009    Current Outpatient Medications on File Prior  to Visit  Medication Sig Dispense Refill  . albuterol (PROVENTIL HFA;VENTOLIN HFA) 108 (90 BASE) MCG/ACT inhaler Inhale 2 puffs into the lungs every 6 (six) hours as needed. For shortness of breath. 1 Inhaler 0  . alum & mag hydroxide-simeth (MAALOX/MYLANTA) 200-200-20 MG/5ML suspension Take 30 mLs as needed by mouth for indigestion or heartburn.    Marland Kitchen aspirin EC 81 MG tablet Take 1 tablet (81 mg total) by mouth daily.    . baclofen (LIORESAL) 20 MG tablet Take 20 mg 2 (two) times daily by mouth.    .  benzonatate (TESSALON) 100 MG capsule Take 100 mg by mouth 2 (two) times daily as needed for cough.     . Blood Glucose Monitoring Suppl (ONE TOUCH ULTRA MINI) w/Device KIT 3 (three) times daily. for testing  0  . budesonide-formoterol (SYMBICORT) 160-4.5 MCG/ACT inhaler Inhale 2 puffs into the lungs 2 (two) times daily. (Patient taking differently: Inhale 2 puffs 2 (two) times daily as needed into the lungs (shortness of breath). ) 2 Inhaler 0  . calcium carbonate (TUMS - DOSED IN MG ELEMENTAL CALCIUM) 500 MG chewable tablet Chew 2 tablets daily as needed by mouth for indigestion or heartburn.    . Cholecalciferol (VITAMIN D) 2000 UNITS tablet Take 2,000 Units by mouth daily.     . clonazePAM (KLONOPIN) 0.5 MG tablet Take 0.5 mg daily by mouth.    . clopidogrel (PLAVIX) 75 MG tablet Take 1 tablet (75 mg total) by mouth daily. 90 tablet 3  . Cyanocobalamin (B-12 COMPLIANCE INJECTION) 1000 MCG/ML KIT Inject 1,000 mcg every 30 (thirty) days as directed.    . cyclobenzaprine (FLEXERIL) 5 MG tablet TAKE 1 TABLET BY MOUTH AT BEDTIME 30 tablet 0  . diclofenac sodium (VOLTAREN) 1 % GEL Apply 2 g topically every 4 (four) hours. (Patient taking differently: Apply 2 g every 4 (four) hours as needed topically (muscle pain). )    . diltiazem (CARDIZEM CD) 240 MG 24 hr capsule Take 1 capsule (240 mg total) by mouth daily. 30 capsule 10  . fexofenadine (ALLEGRA) 180 MG tablet Take 1 tablet (180 mg total) by mouth daily. 30 tablet 6  . furosemide (LASIX) 40 MG tablet Take 40 mg every other day by mouth.     . insulin lispro protamine-insulin lispro (HUMALOG 75/25) (75-25) 100 UNIT/ML SUSP Inject 20-30 Units into the skin See admin instructions. Takes 20 units in the morning and 30  units with supper    . isosorbide mononitrate (IMDUR) 60 MG 24 hr tablet Take 1.5 tablets (90 mg total) by mouth daily. 135 tablet 3  . loperamide (IMODIUM) 2 MG capsule Take 2 mg by mouth as needed for diarrhea or loose stools.     .  meclizine (ANTIVERT) 25 MG tablet Take 25 mg by mouth 2 (two) times daily as needed for dizziness.     . methocarbamol (ROBAXIN) 500 MG tablet Take 500 mg 2 (two) times daily by mouth.    . metoprolol succinate (TOPROL-XL) 50 MG 24 hr tablet Take 1 tablet (50 mg total) by mouth daily. 30 tablet 10  . montelukast (SINGULAIR) 5 MG chewable tablet Chew 1 tablet (5 mg total) by mouth at bedtime. 30 tablet 3  . Multiple Vitamin (MULTIVITAMIN WITH MINERALS) TABS Take 1 tablet by mouth daily.    . Nebulizers (COMPRESSOR/NEBULIZER) MISC Use with albuterol 1 each 0  . nitroGLYCERIN (NITROSTAT) 0.4 MG SL tablet Place 1 tablet (0.4 mg total) under the tongue every 5 (five)  minutes as needed. (Patient taking differently: Place 0.4 mg every 5 (five) minutes as needed under the tongue for chest pain. ) 25 tablet 3  . ONE TOUCH ULTRA TEST test strip CHECK BLOOD SUGAR TWICE A DAY DX: E11.9  3  . ONETOUCH DELICA LANCETS 25Z MISC 3 (three) times daily. for testing  0  . Pancrelipase, Lip-Prot-Amyl, 24000 units CPEP Take 2 capsules (48,000 Units total) by mouth 3 (three) times daily with meals. Take 1 capsule with a snack 210 capsule 6  . pantoprazole (PROTONIX) 40 MG tablet Take 1 tablet (40 mg total) by mouth 2 (two) times daily before a meal. 180 tablet 3  . potassium chloride SA (K-DUR,KLOR-CON) 20 MEQ tablet Take 20 mEq daily by mouth.     . Spacer/Aero-Holding Chambers (AEROCHAMBER MV) inhaler Use as instructed 1 each 0  . traZODone (DESYREL) 50 MG tablet Take 1-2 tablets (50-100 mg total) at bedtime as needed by mouth for sleep. 180 tablet 0   Current Facility-Administered Medications on File Prior to Visit  Medication Dose Route Frequency Provider Last Rate Last Dose  . cyanocobalamin ((VITAMIN B-12)) injection 1,000 mcg  1,000 mcg Intramuscular Q30 days Binnie Rail, MD   1,000 mcg at 03/26/16 1658    Past Medical History:  Diagnosis Date  . Adenomatous colon polyp   . Allergy   . Anxiety   . Asthma    . Chest pain   . COPD (chronic obstructive pulmonary disease) ([place])    pt is unsure if has been officially diagnosed  . Coronary artery disease    CABG '09- cathed 12/09, 9/10, 6/11, 3/14 and 12/13/16- medical Rx  . Diabetes mellitus   . GERD (gastroesophageal reflux disease)   . Hiatal hernia   . Hyperlipidemia   . Hypertension   . Myocardial infarction ([place]) 2009  . PONV (postoperative nausea and vomiting)   . Schatzki's ring   . Shoulder injury    resolved after shoulder surgery  . Sleep apnea    not on cpap    Past Surgical History:  Procedure Laterality Date  . ABDOMINAL HYSTERECTOMY    . APPENDECTOMY     came out with Hysterectomy  . CARDIAC CATHETERIZATION  07/23/2009   EF 60%  . CARDIAC CATHETERIZATION  10/11/2008  . CARDIAC CATHETERIZATION  03/01/2007   EF 75-80%  . CARDIOVASCULAR STRESS TEST  11/15/2007   EF 60%  . COLONOSCOPY    . CORONARY ARTERY BYPASS GRAFT     SEVERELY DISEASED SAPHENOUS VEIN GRAFT TO THE RIGHT CORONARY ARTERY BUT WITH FAIRLY WELL PRESERVED FLOW TO THE DISTAL RIGHT CORONARY ARTERY FROM THE NATIVE CIRCULATION-RESTART  CATH IN JUNE 2000, REVEALS MILD/MODERATE  CAD WITH GOOD FLOW DOWN HER LAD  . ESOPHAGOGASTRODUODENOSCOPY    . EYE SURGERY     bilateral cataract surgery with lens implant  . LEFT HEART CATH AND CORS/GRAFTS ANGIOGRAPHY N/A 12/14/2016   Procedure: LEFT HEART CATH AND CORS/GRAFTS ANGIOGRAPHY;  Surgeon: Jettie Booze, MD;  Location: [facility];  Service: Cardiovascular;  Laterality: N/A;  . lense removal Left   . POLYPECTOMY    . ROTATOR CUFF REPAIR     right and left  . TONSILLECTOMY     age 59  . 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 tumor removed kidney    . UPPER GASTROINTESTINAL ENDOSCOPY    .  US ECHOCARDIOGRAPHY  03/08/2008   EF 55-60%    Social History    Socioeconomic History  . Marital status: Widowed    Spouse name: None  . Number of children: 4  . Years of education: Designer, jewellery  . Highest education level: None  Social Needs  . Financial resource strain: Not hard at all  . Food insecurity - worry: Never true  . Food insecurity - inability: Never true  . Transportation needs - medical: No  . Transportation needs - non-medical: No  Occupational History  . Occupation: Retired  Tobacco Use  . Smoking status: Former Smoker    Packs/day: 0.50    Years: 21.00    Pack years: 10.50    Types: Cigarettes    Start date: 02/09/1955    Last attempt to quit: 02/09/1976    Years since quitting: 40.9  . Smokeless tobacco: Never Used  Substance and Sexual Activity  . Alcohol use: No    Alcohol/week: 0.0 oz  . Drug use: No  . Sexual activity: No  Other Topics Concern  . None  Social History Narrative   Lives alone.   Caffeine use: Drinks 1 cup coffee/day      Originally from [place]. Previously has lived in [place]. Prior travel to [place], [place], [place], [place], [place], [place], [place], & [place]. Previously worked in [employer] She has a dog currently. No bird, mold, or hot tub exposure. She also pastors a church.     Family History  Problem Relation Age of Onset  . Heart disease Maternal Grandfather   . Heart failure Maternal Grandfather   . Diabetes Maternal Grandfather   . Heart attack Father   . Diabetes Mother   . Rheum arthritis Sister   . Emphysema Paternal Uncle   . Esophageal cancer Brother 56       she said he was born with it  . Emphysema Paternal Aunt   . Neuropathy Neg Hx   . Multiple sclerosis Neg Hx   . Colon cancer Neg Hx   . Colon polyps Neg Hx   . Rectal cancer Neg Hx   . Stomach cancer Neg Hx     Review of Systems  Constitutional: Negative for chills and fever.  Respiratory: Positive for cough (dry), shortness of breath and wheezing.   Cardiovascular: Positive for chest pain, palpitations and leg swelling  (feet).  Gastrointestinal: Negative for abdominal pain and diarrhea.       Gerd controlled  Musculoskeletal: Positive for gait problem and neck pain.       Muscle cramping, left thigh pain  Neurological: Positive for light-headedness and headaches.       Objective:   Vitals:   01/25/17 0954  BP: (!) 148/72  Pulse: (!) 110  Resp: 16  Temp: 98.1 F (36.7 C)   Wt Readings from Last 3 Encounters:  01/25/17 200 lb (90.7 kg)  01/14/17 205 lb (93 kg)  01/12/17 211 lb (95.7 kg)   Body mass index is 35.43 kg/m.   Physical Exam    Constitutional: Appears well-developed and well-nourished. No distress.  HENT:  Head: Normocephalic and atraumatic.  Neck: Neck supple. No tracheal deviation present. No thyromegaly present.  No cervical lymphadenopathy Cardiovascular: Normal rate, regular rhythm and normal heart sounds.   No murmur heard. No carotid bruit .  No edema Pulmonary/Chest: Effort normal and breath sounds normal. No respiratory distress. No has no wheezes. No rales.  Skin: Skin is warm and dry. Not diaphoretic.  Psychiatric: Normal mood and affect. Behavior is normal.      Assessment & Plan:    See Problem List for Assessment and Plan of chronic medical problems.

## 2017-01-25 ENCOUNTER — Encounter: Payer: Self-pay | Admitting: Internal Medicine

## 2017-01-25 ENCOUNTER — Ambulatory Visit (INDEPENDENT_AMBULATORY_CARE_PROVIDER_SITE_OTHER): Payer: Medicare Other | Admitting: Internal Medicine

## 2017-01-25 VITALS — BP 148/72 | HR 110 | Temp 98.1°F | Resp 16 | Wt 200.0 lb

## 2017-01-25 DIAGNOSIS — E785 Hyperlipidemia, unspecified: Secondary | ICD-10-CM

## 2017-01-25 DIAGNOSIS — E1059 Type 1 diabetes mellitus with other circulatory complications: Secondary | ICD-10-CM

## 2017-01-25 DIAGNOSIS — G4709 Other insomnia: Secondary | ICD-10-CM

## 2017-01-25 DIAGNOSIS — R519 Headache, unspecified: Secondary | ICD-10-CM

## 2017-01-25 DIAGNOSIS — R51 Headache: Secondary | ICD-10-CM

## 2017-01-25 DIAGNOSIS — R252 Cramp and spasm: Secondary | ICD-10-CM

## 2017-01-25 MED ORDER — GABAPENTIN 100 MG PO CAPS: ORAL_CAPSULE | ORAL | 5 refills | Status: DC

## 2017-01-25 NOTE — Assessment & Plan Note (Signed)
Has rib/side cramping during the day and slight cramping at night Taking gabapentin for chronic neck and headache pain, which may also help with cramping Taking a muscle relaxant-has been taking more than 1-prescribed orthopedics

## 2017-01-25 NOTE — Assessment & Plan Note (Signed)
Following with endocrine Sugars have been well controlled Management per endocrine

## 2017-01-25 NOTE — Assessment & Plan Note (Signed)
We will see neurology next month Some improvement with gabapentin at bedtime, but 300 mg is making her too drowsy in the morning-we will decrease to 200 mg nightly

## 2017-01-25 NOTE — Assessment & Plan Note (Signed)
Statin intolerant Did not tolerate Repatha Has follow-up with cardiology

## 2017-01-25 NOTE — Patient Instructions (Addendum)
    Medications reviewed and updated.  Changes include decreasing gabapentin to 200 mg at night.     Please followup in 6 months

## 2017-01-25 NOTE — Assessment & Plan Note (Signed)
Taking trazodone and gabapentin nightly We will continue

## 2017-01-27 ENCOUNTER — Ambulatory Visit: Payer: Medicare Other | Admitting: Pulmonary Disease

## 2017-01-27 NOTE — Progress Notes (Deleted)
Subjective:   PATIENT ID: [name] GENDER: female DOB: 10-Feb-1940, MRN: [id number]  Synopsis: Former patient of Dr. Ashok Cordia for chronic obstructive asthma, allergic rhinitis and GERD  HPI  No chief complaint on file.   ***  Past Medical History:  Diagnosis Date  . Adenomatous colon polyp   . Allergy   . Anxiety   . Asthma   . Chest pain   . COPD (chronic obstructive pulmonary disease) ([place])    pt is unsure if has been officially diagnosed  . Coronary artery disease    CABG '09- cathed 12/09, 9/10, 6/11, 3/14 and 12/13/16- medical Rx  . Diabetes mellitus   . GERD (gastroesophageal reflux disease)   . Hiatal hernia   . Hyperlipidemia   . Hypertension   . Myocardial infarction ([place]) 2009  . PONV (postoperative nausea and vomiting)   . Schatzki's ring   . Shoulder injury    resolved after shoulder surgery  . Sleep apnea    not on cpap     Family History  Problem Relation Age of Onset  . Heart disease Maternal Grandfather   . Heart failure Maternal Grandfather   . Diabetes Maternal Grandfather   . Heart attack Father   . Diabetes Mother   . Rheum arthritis Sister   . Emphysema Paternal Uncle   . Esophageal cancer Brother 63       she said he was born with it  . Emphysema Paternal Aunt   . Neuropathy Neg Hx   . Multiple sclerosis Neg Hx   . Colon cancer Neg Hx   . Colon polyps Neg Hx   . Rectal cancer Neg Hx   . Stomach cancer Neg Hx      Social History   Socioeconomic History  . Marital status: Widowed    Spouse name: Not on file  . Number of children: 4  . Years of education: Designer, jewellery  . Highest education level: Not on file  Social Needs  . Financial resource strain: Not hard at all  . Food insecurity - worry: Never true  . Food insecurity - inability: Never true  . Transportation needs - medical: No  . Transportation needs - non-medical: No  Occupational History  . Occupation: Retired  Tobacco Use  . Smoking status: Former Smoker   Packs/day: 0.50    Years: 21.00    Pack years: 10.50    Types: Cigarettes    Start date: 02/09/1955    Last attempt to quit: 02/09/1976    Years since quitting: 40.9  . Smokeless tobacco: Never Used  Substance and Sexual Activity  . Alcohol use: No    Alcohol/week: 0.0 oz  . Drug use: No  . Sexual activity: No  Other Topics Concern  . Not on file  Social History Narrative   Lives alone.   Caffeine use: Drinks 1 cup coffee/day      Originally from [place]. Previously has lived in [place]. Prior travel to [place], [place], [place], [place], [place], [place], [place], & [place]. Previously worked in [employer] She has a dog currently. No bird, mold, or hot tub exposure. She also pastors a church.      Allergies  Allergen Reactions  . Amlodipine Other (See Comments)    hallucinations   . Banana Nausea And Vomiting    Stomach pumped  . Co Q10 [Coenzyme Q10]     Body cramps  . Codeine     Hallucinate, loose identity and don't  know who I am  . Morphine Other (See Comments)    Can not function, it immobilizes me   . Pravastatin     Hands locked up  . Repatha [Evolocumab]     myalgias  . Statins     Muscle cramps  . Sulfonamide Derivatives Swelling     Outpatient Medications Prior to Visit  Medication Sig Dispense Refill  . albuterol (PROVENTIL HFA;VENTOLIN HFA) 108 (90 BASE) MCG/ACT inhaler Inhale 2 puffs into the lungs every 6 (six) hours as needed. For shortness of breath. 1 Inhaler 0  . alum & mag hydroxide-simeth (MAALOX/MYLANTA) 200-200-20 MG/5ML suspension Take 30 mLs as needed by mouth for indigestion or heartburn.    Marland Kitchen aspirin EC 81 MG tablet Take 1 tablet (81 mg total) by mouth daily.    . baclofen (LIORESAL) 20 MG tablet Take 20 mg 2 (two) times daily by mouth.    . benzonatate (TESSALON) 100 MG capsule Take 100 mg by mouth 2 (two) times daily as needed for cough.     . Blood Glucose Monitoring Suppl (ONE TOUCH ULTRA MINI) w/Device KIT 3 (three) times daily. for testing  0  .  budesonide-formoterol (SYMBICORT) 160-4.5 MCG/ACT inhaler Inhale 2 puffs into the lungs 2 (two) times daily. (Patient taking differently: Inhale 2 puffs 2 (two) times daily as needed into the lungs (shortness of breath). ) 2 Inhaler 0  . calcium carbonate (TUMS - DOSED IN MG ELEMENTAL CALCIUM) 500 MG chewable tablet Chew 2 tablets daily as needed by mouth for indigestion or heartburn.    . Cholecalciferol (VITAMIN D) 2000 UNITS tablet Take 2,000 Units by mouth daily.     . clonazePAM (KLONOPIN) 0.5 MG tablet Take 0.5 mg daily by mouth.    . clopidogrel (PLAVIX) 75 MG tablet Take 1 tablet (75 mg total) by mouth daily. 90 tablet 3  . Cyanocobalamin (B-12 COMPLIANCE INJECTION) 1000 MCG/ML KIT Inject 1,000 mcg every 30 (thirty) days as directed.    . cyclobenzaprine (FLEXERIL) 5 MG tablet TAKE 1 TABLET BY MOUTH AT BEDTIME 30 tablet 0  . diclofenac sodium (VOLTAREN) 1 % GEL Apply 2 g topically every 4 (four) hours. (Patient taking differently: Apply 2 g every 4 (four) hours as needed topically (muscle pain). )    . diltiazem (CARDIZEM CD) 240 MG 24 hr capsule Take 1 capsule (240 mg total) by mouth daily. 30 capsule 10  . fexofenadine (ALLEGRA) 180 MG tablet Take 1 tablet (180 mg total) by mouth daily. 30 tablet 6  . furosemide (LASIX) 40 MG tablet Take 40 mg every other day by mouth.     . gabapentin (NEURONTIN) 100 MG capsule Take 200 mg at night 60 capsule 5  . insulin lispro protamine-insulin lispro (HUMALOG 75/25) (75-25) 100 UNIT/ML SUSP Inject 20-30 Units into the skin See admin instructions. Takes 20 units in the morning and 30  units with supper    . isosorbide mononitrate (IMDUR) 60 MG 24 hr tablet Take 1.5 tablets (90 mg total) by mouth daily. 135 tablet 3  . loperamide (IMODIUM) 2 MG capsule Take 2 mg by mouth as needed for diarrhea or loose stools.     . meclizine (ANTIVERT) 25 MG tablet Take 25 mg by mouth 2 (two) times daily as needed for dizziness.     . methocarbamol (ROBAXIN) 500 MG  tablet Take 500 mg 2 (two) times daily by mouth.    . metoprolol succinate (TOPROL-XL) 50 MG 24 hr tablet Take 1 tablet (50 mg  total) by mouth daily. 30 tablet 10  . montelukast (SINGULAIR) 5 MG chewable tablet Chew 1 tablet (5 mg total) by mouth at bedtime. 30 tablet 3  . Multiple Vitamin (MULTIVITAMIN WITH MINERALS) TABS Take 1 tablet by mouth daily.    . Nebulizers (COMPRESSOR/NEBULIZER) MISC Use with albuterol 1 each 0  . nitroGLYCERIN (NITROSTAT) 0.4 MG SL tablet Place 1 tablet (0.4 mg total) under the tongue every 5 (five) minutes as needed. (Patient taking differently: Place 0.4 mg every 5 (five) minutes as needed under the tongue for chest pain. ) 25 tablet 3  . ONE TOUCH ULTRA TEST test strip CHECK BLOOD SUGAR TWICE A DAY DX: E11.9  3  . ONETOUCH DELICA LANCETS 97Q MISC 3 (three) times daily. for testing  0  . Pancrelipase, Lip-Prot-Amyl, 24000 units CPEP Take 2 capsules (48,000 Units total) by mouth 3 (three) times daily with meals. Take 1 capsule with a snack 210 capsule 6  . pantoprazole (PROTONIX) 40 MG tablet Take 1 tablet (40 mg total) by mouth 2 (two) times daily before a meal. 180 tablet 3  . potassium chloride SA (K-DUR,KLOR-CON) 20 MEQ tablet Take 20 mEq daily by mouth.     . Spacer/Aero-Holding Chambers (AEROCHAMBER MV) inhaler Use as instructed 1 each 0  . traZODone (DESYREL) 50 MG tablet Take 1-2 tablets (50-100 mg total) at bedtime as needed by mouth for sleep. 180 tablet 0   Facility-Administered Medications Prior to Visit  Medication Dose Route Frequency Provider Last Rate Last Dose  . cyanocobalamin ((VITAMIN B-12)) injection 1,000 mcg  1,000 mcg Intramuscular Q30 days Binnie Rail, [place]   1,000 mcg at 03/26/16 1658    ROS    Objective:  Physical Exam   There were no vitals filed for this visit.  ***  CBC    Component Value Date/Time   WBC 6.1 01/04/2017 1505   RBC 3.89 01/04/2017 1505   HGB 9.3 (L) 01/04/2017 1505   HGB 10.6 (L) 12/10/2016 1346   HCT  31.3 (L) 01/04/2017 1505   HCT 34.1 12/10/2016 1346   PLT 271 01/04/2017 1505   PLT 270 12/10/2016 1346   MCV 80.5 01/04/2017 1505   MCV 78 (L) 12/10/2016 1346   MCH 23.9 (L) 01/04/2017 1505   MCHC 29.7 (L) 01/04/2017 1505   RDW 16.4 (H) 01/04/2017 1505   RDW 16.1 (H) 12/10/2016 1346   LYMPHSABS 2.2 06/21/2016 1628   MONOABS 0.6 05/19/2016 1152   EOSABS 0.1 06/21/2016 1628   BASOSABS 0.0 06/21/2016 1628     PFT 12/23/16: FVC 1.44 L (72%) FEV1 0.96 L (63%) FEV1/FVC 0.67 FEF 25-75 0.55 L (42%) negative bronchodilator response TLC 3.85 L (78%) RV 102% DLCO uncorrected 45% 06/04/15: FVC 1.57 L (77%) FEV1 1.01 L (64%) FEV1/FVC 0.65 FEF 25-75 0.54 L (39%) negative bronchodilator response 02/21/15: FVC 1.60 L (78%) FEV1 1.03 L (65%) FEV1/FVC 0.64 FEF 25-75 0.51 L (37%) positive bronchodilator response 11/20/14: FVC 1.44 L (70%) FEV1 0.91 L (57%) FEV1/FVC 0.63 FEF 25-75 0.47 L (33%) positive bronchodilator response TLC 3.92 L (79%) RV 103% DLCO uncorrected 64% 11/12/13: FVC 1.57 L (74%) FEV1 1.09 L (67%) FEV1/FVC 0.70 FEF 25-75 0.72 L (50%) negative bronchodilator response 08/04/11: FVC 1.61 L (60%) FEV1 0.89 L (48%) FEV1/FVC 0.56 FEF 25-75 0.29 L (14%) positive bronchodilator response TLC 3.67 L (80%) RV 103% ERV 46% DLCO corrected 38%  6MWT 11/20/14:  Walked 336 meters / Baseline Sat 100% on RA / Nadir Sat 99% on RA (  pt c/o pain in her right neck w/ dyspnea)  IMAGING CXR PA/LAT 03/13/15: No new nodule or opacity appreciated. No pleural effusion. Mild hyperinflation with flattening of the diaphragms. Heart normal in size. Mediastinum normal in contour.  BARIUM SWALLOW 10/15/14: Schatzki ring distal esophagus maximum diameter 1.2 cm with barium tablet impacting at this ring.  CXR PA/LAT 02/13/14:  Sternotomy wires from prior thoracic surgery noted. Heart normal in size. No pleural effusion appreciated. No parenchymal nodule or opacification appreciated. Mild flattening of the diaphragms  bilaterally.  CARDIAC LHC (12/14/16):  Prox LAD lesion is 80% stenosed. LIMA to LAD is patent.  Prox RCA lesion is 25% stenosed. SVG to PDA is occluded.  Mid RCA lesion is 25% stenosed.  Circumflex is a large tortuous vessel.  Ost 1st Diag lesion is 100% stenosed. SVG to diagonal is patent.  The left ventricular systolic function is normal.  LV end diastolic pressure is normal.  The left ventricular ejection fraction is 55-65% by visual estimate.  There is no aortic valve stenosis.  TTE (10/06/11): LV normal in size. Normal regional wall motion. EF 60-65%. Grade 1 diastolic dysfunction. LA & RA normal in size. 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 RV normal in size and function. RVSP 19 mmHg. No aortic stenosis or regurgitation. No mitral stenosis or regurgitation. No pulmonic stenosis. Trivial tricuspid regurgitation. No pericardial effusion.  LABS 03/13/15 Procalcitonin:  <0.1 CBC: 10.9/11.8/37.3/245  10/08/14 IgG: 1060 IgM: 112 IgA: 272 IgE: 5 CBC: 6.1/11.3/36.0/293 Differential: Eos 0.3 (4.5%)  RAST panel: Negative Aspergillus antigen: <0.1  03/06/07 ABG:  7.39/44/113  Records from our visit with Dr Ashok Cordia and Eric Form earlier this year where the patient was treated for asthma with Symbicort and Spiriva     Assessment & Plan:   No diagnosis found.  Discussion: ***    Current Outpatient Medications:  .  albuterol (PROVENTIL HFA;VENTOLIN HFA) 108 (90 BASE) MCG/ACT inhaler, Inhale 2 puffs into the lungs every 6 (six) hours as needed. For shortness of breath., Disp: 1 Inhaler, Rfl: 0 .  alum & mag hydroxide-simeth (MAALOX/MYLANTA) 200-200-20 MG/5ML suspension, Take 30 mLs as needed by mouth for indigestion or heartburn., Disp: , Rfl:  .  aspirin EC 81 MG tablet, Take 1 tablet (81 mg total) by mouth daily., Disp: , Rfl:  .  baclofen (LIORESAL) 20 MG tablet, Take 20 mg 2 (two) times daily by mouth., Disp: , Rfl:  .  benzonatate (TESSALON) 100 MG  capsule, Take 100 mg by mouth 2 (two) times daily as needed for cough. , Disp: , Rfl:  .  Blood Glucose Monitoring Suppl (ONE TOUCH ULTRA MINI) w/Device KIT, 3 (three) times daily. for testing, Disp: , Rfl: 0 .  budesonide-formoterol (SYMBICORT) 160-4.5 MCG/ACT inhaler, Inhale 2 puffs into the lungs 2 (two) times daily. (Patient taking differently: Inhale 2 puffs 2 (two) times daily as needed into the lungs (shortness of breath). ), Disp: 2 Inhaler, Rfl: 0 .  calcium carbonate (TUMS - DOSED IN MG ELEMENTAL CALCIUM) 500 MG chewable tablet, Chew 2 tablets daily as needed by mouth for indigestion or heartburn., Disp: , Rfl:  .  Cholecalciferol (VITAMIN D) 2000 UNITS tablet, Take 2,000 Units by mouth daily. , Disp: , Rfl:  .  clonazePAM (KLONOPIN) 0.5 MG tablet, Take 0.5 mg daily by mouth., Disp: , Rfl:  .  clopidogrel (PLAVIX) 75 MG tablet, Take 1 tablet (75 mg total) by mouth daily., Disp: 90 tablet, Rfl: 3 .  Cyanocobalamin (B-12 COMPLIANCE INJECTION)  1000 MCG/ML KIT, Inject 1,000 mcg every 30 (thirty) days as directed., Disp: , Rfl:  .  cyclobenzaprine (FLEXERIL) 5 MG tablet, TAKE 1 TABLET BY MOUTH AT BEDTIME, Disp: 30 tablet, Rfl: 0 .  diclofenac sodium (VOLTAREN) 1 % GEL, Apply 2 g topically every 4 (four) hours. (Patient taking differently: Apply 2 g every 4 (four) hours as needed topically (muscle pain). ), Disp: , Rfl:  .  diltiazem (CARDIZEM CD) 240 MG 24 hr capsule, Take 1 capsule (240 mg total) by mouth daily., Disp: 30 capsule, Rfl: 10 .  fexofenadine (ALLEGRA) 180 MG tablet, Take 1 tablet (180 mg total) by mouth daily., Disp: 30 tablet, Rfl: 6 .  furosemide (LASIX) 40 MG tablet, Take 40 mg every other day by mouth. , Disp: , Rfl:  .  gabapentin (NEURONTIN) 100 MG capsule, Take 200 mg at night, Disp: 60 capsule, Rfl: 5 .  insulin lispro protamine-insulin lispro (HUMALOG 75/25) (75-25) 100 UNIT/ML SUSP, Inject 20-30 Units into the skin See admin instructions. Takes 20 units in the morning and  30  units with supper, Disp: , Rfl:  .  isosorbide mononitrate (IMDUR) 60 MG 24 hr tablet, Take 1.5 tablets (90 mg total) by mouth daily., Disp: 135 tablet, Rfl: 3 .  loperamide (IMODIUM) 2 MG capsule, Take 2 mg by mouth as needed for diarrhea or loose stools. , Disp: , Rfl:  .  meclizine (ANTIVERT) 25 MG tablet, Take 25 mg by mouth 2 (two) times daily as needed for dizziness. , Disp: , Rfl:  .  methocarbamol (ROBAXIN) 500 MG tablet, Take 500 mg 2 (two) times daily by mouth., Disp: , Rfl:  .  metoprolol succinate (TOPROL-XL) 50 MG 24 hr tablet, Take 1 tablet (50 mg total) by mouth daily., Disp: 30 tablet, Rfl: 10 .  montelukast (SINGULAIR) 5 MG chewable tablet, Chew 1 tablet (5 mg total) by mouth at bedtime., Disp: 30 tablet, Rfl: 3 .  Multiple Vitamin (MULTIVITAMIN WITH MINERALS) TABS, Take 1 tablet by mouth daily., Disp: , Rfl:  .  Nebulizers (COMPRESSOR/NEBULIZER) MISC, Use with albuterol, Disp: 1 each, Rfl: 0 .  nitroGLYCERIN (NITROSTAT) 0.4 MG SL tablet, Place 1 tablet (0.4 mg total) under the tongue every 5 (five) minutes as needed. (Patient taking differently: Place 0.4 mg every 5 (five) minutes as needed under the tongue for chest pain. ), Disp: 25 tablet, Rfl: 3 .  ONE TOUCH ULTRA TEST test strip, CHECK BLOOD SUGAR TWICE A DAY DX: E11.9, Disp: , Rfl: 3 .  ONETOUCH DELICA LANCETS 17B MISC, 3 (three) times daily. for testing, Disp: , Rfl: 0 .  Pancrelipase, Lip-Prot-Amyl, 24000 units CPEP, Take 2 capsules (48,000 Units total) by mouth 3 (three) times daily with meals. Take 1 capsule with a snack, Disp: 210 capsule, Rfl: 6 .  pantoprazole (PROTONIX) 40 MG tablet, Take 1 tablet (40 mg total) by mouth 2 (two) times daily before a meal., Disp: 180 tablet, Rfl: 3 .  potassium chloride SA (K-DUR,KLOR-CON) 20 MEQ tablet, Take 20 mEq daily by mouth. , Disp: , Rfl:  .  Spacer/Aero-Holding Chambers (AEROCHAMBER MV) inhaler, Use as instructed, Disp: 1 each, Rfl: 0 .  traZODone (DESYREL) 50 MG tablet,  Take 1-2 tablets (50-100 mg total) at bedtime as needed by mouth for sleep., Disp: 180 tablet, Rfl: 0  Current Facility-Administered Medications:  .  cyanocobalamin ((VITAMIN B-12)) injection 1,000 mcg, 1,000 mcg, Intramuscular, Q30 days, Binnie Rail, MD, 1,000 mcg at 03/26/16 1658

## 2017-02-15 ENCOUNTER — Other Ambulatory Visit: Payer: Self-pay | Admitting: Ophthalmology

## 2017-02-15 DIAGNOSIS — H34219 Partial retinal artery occlusion, unspecified eye: Secondary | ICD-10-CM

## 2017-02-16 ENCOUNTER — Other Ambulatory Visit (HOSPITAL_COMMUNITY): Payer: Self-pay | Admitting: Ophthalmology

## 2017-02-16 DIAGNOSIS — H34211 Partial retinal artery occlusion, right eye: Secondary | ICD-10-CM

## 2017-02-17 ENCOUNTER — Ambulatory Visit (HOSPITAL_BASED_OUTPATIENT_CLINIC_OR_DEPARTMENT_OTHER): Payer: Medicare Other

## 2017-02-17 ENCOUNTER — Other Ambulatory Visit: Payer: Self-pay

## 2017-02-17 DIAGNOSIS — Z8249 Family history of ischemic heart disease and other diseases of the circulatory system: Secondary | ICD-10-CM | POA: Diagnosis not present

## 2017-02-17 DIAGNOSIS — I2510 Atherosclerotic heart disease of native coronary artery without angina pectoris: Secondary | ICD-10-CM | POA: Diagnosis not present

## 2017-02-17 DIAGNOSIS — J449 Chronic obstructive pulmonary disease, unspecified: Secondary | ICD-10-CM | POA: Diagnosis not present

## 2017-02-17 DIAGNOSIS — H34219 Partial retinal artery occlusion, unspecified eye: Secondary | ICD-10-CM | POA: Diagnosis not present

## 2017-02-17 DIAGNOSIS — I6523 Occlusion and stenosis of bilateral carotid arteries: Secondary | ICD-10-CM | POA: Diagnosis not present

## 2017-02-17 DIAGNOSIS — Z87891 Personal history of nicotine dependence: Secondary | ICD-10-CM | POA: Diagnosis not present

## 2017-02-17 DIAGNOSIS — G4733 Obstructive sleep apnea (adult) (pediatric): Secondary | ICD-10-CM | POA: Diagnosis not present

## 2017-02-17 DIAGNOSIS — E785 Hyperlipidemia, unspecified: Secondary | ICD-10-CM | POA: Diagnosis not present

## 2017-02-17 DIAGNOSIS — I10 Essential (primary) hypertension: Secondary | ICD-10-CM | POA: Diagnosis not present

## 2017-02-18 ENCOUNTER — Ambulatory Visit (HOSPITAL_COMMUNITY)
Admission: RE | Admit: 2017-02-18 | Discharge: 2017-02-18 | Disposition: A | Discharge status: Home / Self Care | Payer: Medicare Other | Source: Ambulatory Visit | Attending: Cardiovascular Disease | Admitting: Cardiovascular Disease

## 2017-02-18 DIAGNOSIS — Z87891 Personal history of nicotine dependence: Secondary | ICD-10-CM | POA: Insufficient documentation

## 2017-02-18 DIAGNOSIS — H34219 Partial retinal artery occlusion, unspecified eye: Secondary | ICD-10-CM | POA: Insufficient documentation

## 2017-02-18 DIAGNOSIS — I10 Essential (primary) hypertension: Secondary | ICD-10-CM | POA: Insufficient documentation

## 2017-02-18 DIAGNOSIS — J449 Chronic obstructive pulmonary disease, unspecified: Secondary | ICD-10-CM | POA: Insufficient documentation

## 2017-02-18 DIAGNOSIS — H34211 Partial retinal artery occlusion, right eye: Secondary | ICD-10-CM | POA: Diagnosis not present

## 2017-02-18 DIAGNOSIS — G4733 Obstructive sleep apnea (adult) (pediatric): Secondary | ICD-10-CM | POA: Insufficient documentation

## 2017-02-18 DIAGNOSIS — Z8249 Family history of ischemic heart disease and other diseases of the circulatory system: Secondary | ICD-10-CM | POA: Insufficient documentation

## 2017-02-18 DIAGNOSIS — E785 Hyperlipidemia, unspecified: Secondary | ICD-10-CM | POA: Insufficient documentation

## 2017-02-18 DIAGNOSIS — I6523 Occlusion and stenosis of bilateral carotid arteries: Secondary | ICD-10-CM | POA: Insufficient documentation

## 2017-02-18 DIAGNOSIS — I2510 Atherosclerotic heart disease of native coronary artery without angina pectoris: Secondary | ICD-10-CM | POA: Insufficient documentation

## 2017-02-23 ENCOUNTER — Telehealth: Payer: Self-pay | Admitting: Cardiovascular Disease

## 2017-02-23 NOTE — Telephone Encounter (Signed)
New message   Patient wants to know when can she come off her heart monitor. Please call

## 2017-02-23 NOTE — Telephone Encounter (Signed)
Spoke with patient to advise her that she can d/c the event monitor since she has been wearing it since 12/17. She has an appointment with Lyda Jester, PA on Friday 1/18. She verbalized understanding and thanked me for the call.

## 2017-02-24 ENCOUNTER — Telehealth: Payer: Self-pay | Admitting: Emergency Medicine

## 2017-02-24 NOTE — Telephone Encounter (Signed)
Noted.  She sees cardiology tomorrow

## 2017-02-24 NOTE — Telephone Encounter (Addendum)
Received call from pts Ophthalmologist.Pt was seen on 02/15/17,  States he found Plaque in right eye. Pt has had plaque in left eye before but right eye was new.   A doppler was ordered- normal, echo- moderate pulmonary HTN  Follow-up with eye dr in 1 month

## 2017-02-25 ENCOUNTER — Ambulatory Visit: Payer: Medicare Other | Admitting: Cardiology

## 2017-02-28 ENCOUNTER — Other Ambulatory Visit: Payer: Self-pay | Admitting: Neurology

## 2017-03-01 ENCOUNTER — Ambulatory Visit: Payer: Medicare Other | Admitting: Physician Assistant

## 2017-03-09 ENCOUNTER — Ambulatory Visit (INDEPENDENT_AMBULATORY_CARE_PROVIDER_SITE_OTHER): Payer: Medicare Other | Admitting: Neurology

## 2017-03-09 ENCOUNTER — Encounter: Payer: Self-pay | Admitting: Neurology

## 2017-03-09 VITALS — BP 164/90 | Resp 12 | Ht 63.0 in | Wt 199.4 lb

## 2017-03-09 DIAGNOSIS — G4733 Obstructive sleep apnea (adult) (pediatric): Secondary | ICD-10-CM

## 2017-03-09 DIAGNOSIS — M542 Cervicalgia: Secondary | ICD-10-CM | POA: Diagnosis not present

## 2017-03-09 DIAGNOSIS — G43819 Other migraine, intractable, without status migrainosus: Secondary | ICD-10-CM

## 2017-03-09 MED ORDER — NORTRIPTYLINE HCL 10 MG PO CAPS: 10.0000 mg | ORAL_CAPSULE | Freq: Every day | ORAL | 2 refills | Status: DC

## 2017-03-09 MED ORDER — PROMETHAZINE HCL 25 MG PO TABS: 25.0000 mg | ORAL_TABLET | Freq: Four times a day (QID) | ORAL | 0 refills | Status: DC | PRN

## 2017-03-09 NOTE — Progress Notes (Signed)
NEUROLOGY CONSULTATION NOTE  [name] MRN: [id number] DOB: 04/06/1940  Referring provider: Dr. Quay Burow Primary care provider: Dr. Quay Burow  Reason for consult:  headache  HISTORY OF PRESENT ILLNESS: [name] is a 77 year old female with CAD, HTN, type 2 diabetes, HLD, B12 deficiency and OSA who presents for headache.  History supplemented by PCP note.  Onset:  She has history of migraines as a young woman which resolved after having children.  Current headaches started in 2016. Location:  She has constant holocephalic headache.  She has severe headache radiating from the base of her neck on the right up Quality:  pounding Intensity:  10/10 Aura:  no Prodrome:  no Postdrome:  no Associated symptoms:  Nausea, phonophobia, dizziness.  No vomiting, photophobia or visual disturbance.  She has not had any new worse headache of her life, waking up from sleep Duration:  All day Frequency:  Daily headache, severe  Headache every other day Frequency of abortive medication: daily Triggers/exacerbating factors:  no Relieving factors:  no Activity:  aggravates  Past NSAIDS:  Ibuprofen, naproxen (caused increased heart rate) Past analgesics:  Excedrin, tramadol and other opioids (adverse reaction) Past abortive triptans:  no Past muscle relaxants:  no Past anti-emetic:  Promethazine (effective) Past antihypertensive medications:  losartan Past antidepressant medications:  no Past anticonvulsant medications:  Gabapentin 210m (drowsiness) Past vitamins/Herbal/Supplements:  CoQ10 Past antihistamines/decongestants:  no Other past therapy:  Physical therapy of neck.  Current NSAIDS:  ASA 82mCurrent analgesics:  Tylenol (takes daily) Current triptans:  no Current anti-emetic:  no Current muscle relaxants:  baclofen 207mwice daily, Robaxin 500m61mlexeril Current anti-anxiolytic:  Klonopin Current sleep aide:  trazodone Current Antihypertensive medications:  Toprol XL 50mg19mardizem, Lasix, Imdur Current Antidepressant medications:  no Current Anticonvulsant medications:  no Current Vitamins/Herbal/Supplements:  B12 Current Antihistamines/Decongestants:  Allegra, Singulair Other therapy:  no  Caffeine:  1 cup coffee daily, tea Alcohol:  no Smoker:  no Diet:  hydrates Exercise:  yes Depression/anxiety:  ok Sleep hygiene:  She has untreated OSA. Family history of headache:  father  MRI of brain without contrast from 09/16/14 was personally reviewed and revealed partially empty sella but otherwise unremarkable.  MRI of cervical spine from 09/16/14 was personally reviewed and demonstrated multilevel degenerative changes including left greater than right spurring at C3-C4 causing left foraminal narrowing, severe spurring at C4-C5 causing right moderate foraminal narrowing, and disc bulg and spurring at C6-C7 with moderate bilateral foraminal narrowing.  She was evaluated by Dr. RamosNelva Bushinterventional pain (epidural or facet joint injections).  He didn't think it was a good idea given that she would have to stop Plavix.  PAST MEDICAL HISTORY: Past Medical History:  Diagnosis Date  . Adenomatous colon polyp   . Allergy   . Anxiety   . Asthma   . Chest pain   . COPD (chronic obstructive pulmonary disease) ([facility]) [place]  . Coronary artery disease    CABG '09- cathed 12/09, 9/10, 6/11, 3/14 and 12/13/16- medical Rx  . Diabetes mellitus   . GERD (gastroesophageal reflux disease)   . Hiatal hernia   . Hyperlipidemia   . Hypertension   . Myocardial infarction (HCC) [place]  . PONV (postoperative nausea and vomiting)   . Schatzki's ring   . Shoulder injury    resolved after shoulder surgery  . Sleep apnea    not on cpap    PAST  SURGICAL HISTORY: Past Surgical History:  Procedure Laterality Date  . ABDOMINAL HYSTERECTOMY    . APPENDECTOMY     came out with Hysterectomy  . CARDIAC CATHETERIZATION  07/23/2009   EF  60%  . CARDIAC CATHETERIZATION  10/11/2008  . CARDIAC CATHETERIZATION  03/01/2007   EF 75-80%  . CARDIOVASCULAR STRESS TEST  11/15/2007   EF 60%  . COLONOSCOPY    . CORONARY ARTERY BYPASS GRAFT     SEVERELY DISEASED SAPHENOUS VEIN GRAFT TO THE RIGHT CORONARY ARTERY BUT WITH FAIRLY WELL PRESERVED FLOW TO THE DISTAL RIGHT CORONARY ARTERY FROM THE NATIVE CIRCULATION-RESTART  CATH IN JUNE 2000, REVEALS MILD/MODERATE  CAD WITH GOOD FLOW DOWN HER LAD  . ESOPHAGOGASTRODUODENOSCOPY    . EYE SURGERY     bilateral cataract surgery with lens implant  . LEFT HEART CATH AND CORS/GRAFTS ANGIOGRAPHY N/A 12/14/2016   Procedure: LEFT HEART CATH AND CORS/GRAFTS ANGIOGRAPHY;  Surgeon: Jettie Booze, MD;  Location: [facility];  Service: Cardiovascular;  Laterality: N/A;  . lense removal Left   . POLYPECTOMY    . ROTATOR CUFF REPAIR     right and left  . TONSILLECTOMY     age 52  . 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 tumor removed kidney    . UPPER GASTROINTESTINAL ENDOSCOPY    . US ECHOCARDIOGRAPHY  03/08/2008   EF 55-60%    MEDICATIONS: Current Outpatient Medications on File Prior to Visit  Medication Sig Dispense Refill  . albuterol (PROVENTIL HFA;VENTOLIN HFA) 108 (90 BASE) MCG/ACT inhaler Inhale 2 puffs into the lungs every 6 (six) hours as needed. For shortness of breath. 1 Inhaler 0  . alum & mag hydroxide-simeth (MAALOX/MYLANTA) 200-200-20 MG/5ML suspension Take 30 mLs as needed by mouth for indigestion or heartburn.    Marland Kitchen aspirin EC 81 MG tablet Take 1 tablet (81 mg total) by mouth daily.    . baclofen (LIORESAL) 20 MG tablet Take 20 mg 2 (two) times daily by mouth.    . benzonatate (TESSALON) 100 MG capsule Take 100 mg by mouth 2 (two) times daily as needed for cough.     . Blood Glucose Monitoring Suppl (ONE TOUCH ULTRA MINI) w/Device KIT 3  (three) times daily. for testing  0  . budesonide-formoterol (SYMBICORT) 160-4.5 MCG/ACT inhaler Inhale 2 puffs into the lungs 2 (two) times daily. (Patient taking differently: Inhale 2 puffs 2 (two) times daily as needed into the lungs (shortness of breath). ) 2 Inhaler 0  . calcium carbonate (TUMS - DOSED IN MG ELEMENTAL CALCIUM) 500 MG chewable tablet Chew 2 tablets daily as needed by mouth for indigestion or heartburn.    . Cholecalciferol (VITAMIN D) 2000 UNITS tablet Take 2,000 Units by mouth daily.     . clonazePAM (KLONOPIN) 0.5 MG tablet Take 0.5 mg daily by mouth.    . clopidogrel (PLAVIX) 75 MG tablet Take 1 tablet (75 mg total) by mouth daily. 90 tablet 3  . Cyanocobalamin (B-12 COMPLIANCE INJECTION) 1000 MCG/ML KIT Inject 1,000 mcg every 30 (thirty) days as directed.    . cyclobenzaprine (FLEXERIL) 5 MG tablet TAKE 1 TABLET BY MOUTH AT BEDTIME 30 tablet 0  . diclofenac sodium (VOLTAREN) 1 % GEL Apply 2 g topically every 4 (four) hours. (Patient taking differently: Apply 2 g every 4 (four) hours as needed topically (muscle pain). )    .  diltiazem (CARDIZEM CD) 240 MG 24 hr capsule Take 1 capsule (240 mg total) by mouth daily. 30 capsule 10  . fexofenadine (ALLEGRA) 180 MG tablet Take 1 tablet (180 mg total) by mouth daily. 30 tablet 6  . furosemide (LASIX) 40 MG tablet Take 40 mg every other day by mouth.     . insulin lispro protamine-insulin lispro (HUMALOG 75/25) (75-25) 100 UNIT/ML SUSP Inject 20-30 Units into the skin See admin instructions. Takes 20 units in the morning and 30  units with supper    . isosorbide mononitrate (IMDUR) 60 MG 24 hr tablet Take 1.5 tablets (90 mg total) by mouth daily. 135 tablet 3  . loperamide (IMODIUM) 2 MG capsule Take 2 mg by mouth as needed for diarrhea or loose stools.     . meclizine (ANTIVERT) 25 MG tablet Take 25 mg by mouth 2 (two) times daily as needed for dizziness.     . methocarbamol (ROBAXIN) 500 MG tablet Take 500 mg 2 (two) times daily  by mouth.    . metoprolol succinate (TOPROL-XL) 50 MG 24 hr tablet Take 1 tablet (50 mg total) by mouth daily. 30 tablet 10  . montelukast (SINGULAIR) 5 MG chewable tablet Chew 1 tablet (5 mg total) by mouth at bedtime. 30 tablet 3  . Multiple Vitamin (MULTIVITAMIN WITH MINERALS) TABS Take 1 tablet by mouth daily.    . Nebulizers (COMPRESSOR/NEBULIZER) MISC Use with albuterol 1 each 0  . nitroGLYCERIN (NITROSTAT) 0.4 MG SL tablet Place 1 tablet (0.4 mg total) under the tongue every 5 (five) minutes as needed. (Patient taking differently: Place 0.4 mg every 5 (five) minutes as needed under the tongue for chest pain. ) 25 tablet 3  . ONE TOUCH ULTRA TEST test strip CHECK BLOOD SUGAR TWICE A DAY DX: E11.9  3  . ONETOUCH DELICA LANCETS 16R MISC 3 (three) times daily. for testing  0  . Pancrelipase, Lip-Prot-Amyl, 24000 units CPEP Take 2 capsules (48,000 Units total) by mouth 3 (three) times daily with meals. Take 1 capsule with a snack 210 capsule 6  . pantoprazole (PROTONIX) 40 MG tablet Take 1 tablet (40 mg total) by mouth 2 (two) times daily before a meal. 180 tablet 3  . potassium chloride SA (K-DUR,KLOR-CON) 20 MEQ tablet Take 20 mEq daily by mouth.     . Spacer/Aero-Holding Chambers (AEROCHAMBER MV) inhaler Use as instructed 1 each 0  . traZODone (DESYREL) 50 MG tablet Take 1-2 tablets (50-100 mg total) at bedtime as needed by mouth for sleep. 180 tablet 0  . gabapentin (NEURONTIN) 100 MG capsule Take 200 mg at night (Patient not taking: Reported on 03/09/2017) 60 capsule 5   Current Facility-Administered Medications on File Prior to Visit  Medication Dose Route Frequency Provider Last Rate Last Dose  . cyanocobalamin ((VITAMIN B-12)) injection 1,000 mcg  1,000 mcg Intramuscular Q30 days Binnie Rail, MD   1,000 mcg at 03/26/16 1658    ALLERGIES: Allergies  Allergen Reactions  . Amlodipine Other (See Comments)    hallucinations   . Banana Nausea And Vomiting    Stomach pumped  . Co Q10  [Coenzyme Q10]     Body cramps  . Codeine     Hallucinate, loose identity and don't know who I am  . Morphine Other (See Comments)    Can not function, it immobilizes me   . Pravastatin     Hands locked up  . Repatha [Evolocumab]     myalgias  . Statins  Muscle cramps  . Sulfonamide Derivatives Swelling    FAMILY HISTORY: Family History  Problem Relation Age of Onset  . Heart disease Maternal Grandfather   . Heart failure Maternal Grandfather   . Diabetes Maternal Grandfather   . Heart attack Father   . Diabetes Mother   . Rheum arthritis Sister   . Emphysema Paternal Uncle   . Esophageal cancer Brother 66       she said he was born with it  . Emphysema Paternal Aunt   . Neuropathy Neg Hx   . Multiple sclerosis Neg Hx   . Colon cancer Neg Hx   . Colon polyps Neg Hx   . Rectal cancer Neg Hx   . Stomach cancer Neg Hx     SOCIAL HISTORY: Social History   Socioeconomic History  . Marital status: Widowed    Spouse name: Not on file  . Number of children: 4  . Years of education: Designer, jewellery  . Highest education level: Not on file  Social Needs  . Financial resource strain: Not hard at all  . Food insecurity - worry: Never true  . Food insecurity - inability: Never true  . Transportation needs - medical: No  . Transportation needs - non-medical: No  Occupational History  . Occupation: Retired  Tobacco Use  . Smoking status: Former Smoker    Packs/day: 0.50    Years: 21.00    Pack years: 10.50    Types: Cigarettes    Start date: 02/09/1955    Last attempt to quit: 02/09/1976    Years since quitting: 41.1  . Smokeless tobacco: Never Used  Substance and Sexual Activity  . Alcohol use: No    Alcohol/week: 0.0 oz  . Drug use: No  . Sexual activity: No  Other Topics Concern  . Not on file  Social History Narrative   Lives alone.  One story home.  Has 4 children.  Education: doctorate in theology.    Caffeine use: Drinks 1 cup coffee/day      Originally from  [place]. Previously has lived in [place]. Prior travel to [place], [place], [place], [place], [place], [place], [place], & [place]. Previously worked in [employer] She has a dog currently. No bird, mold, or hot tub exposure. She also pastors a church.     REVIEW OF SYSTEMS: Constitutional: No fevers, chills, or sweats, no generalized fatigue, change in appetite Eyes: No visual changes, double vision, eye pain Ear, nose and throat: No hearing loss, ear pain, nasal congestion, sore throat Cardiovascular: No chest pain, palpitations Respiratory:  No shortness of breath at rest or with exertion, wheezes GastrointestinaI: No nausea, vomiting, diarrhea, abdominal pain, fecal incontinence Genitourinary:  No dysuria, urinary retention or frequency Musculoskeletal:  No neck pain, back pain Integumentary: No rash, pruritus, skin lesions Neurological: as above Psychiatric: No depression, insomnia, anxiety Endocrine: No palpitations, fatigue, diaphoresis, mood swings, change in appetite, change in weight, increased thirst Hematologic/Lymphatic:  No purpura, petechiae. Allergic/Immunologic: no itchy/runny eyes, nasal congestion, recent allergic reactions, rashes  PHYSICAL EXAM: Vitals:   03/09/17 1452  BP: (!) 164/90  Resp: 12   General: No acute distress.  Patient appears well-groomed.  Head:  Normocephalic/atraumatic Eyes:  fundi examined but not visualized Neck: supple, no paraspinal tenderness, full range of motion Back: No paraspinal tenderness Heart: regular rate and rhythm Lungs: Clear to auscultation bilaterally. Vascular: No carotid bruits. Neurological Exam: Mental status: alert and oriented to person, place, and time, recent and remote memory intact, fund  of knowledge intact, attention and concentration intact, speech fluent and not dysarthric, language intact. Cranial nerves: CN I: not tested CN II: pupils equal, round and reactive to light, visual fields intact CN III, IV, VI:  full range  of motion, no nystagmus, no ptosis CN V: facial sensation intact CN VII: upper and lower face symmetric CN VIII: hearing intact CN IX, X: gag intact, uvula midline CN XI: sternocleidomastoid and trapezius muscles intact CN XII: tongue midline Bulk & Tone: normal, no fasciculations. Motor:  5/5 throughout  Sensation: temperature and vibration sensation intact. Deep Tendon Reflexes:  2+ throughout, toes downgoing.  Finger to nose testing:  Without dysmetria.  Heel to shin:  Without dysmetria.  Gait:  Limp (wearing a boot on left foot due to fracture).  Able to turn. Romberg negative.  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  Start nortriptyline 30m at bedtime.  If headaches not improved in one month, we can increase dose 2.  May take Tylenol or ibuprofen for severe headache but limit to no more than days out of the week to prevent rebound headache.  Promethazine 240mfor nausea. 3.  Try taking turmeric 50031mwice daily to help reduce inflammation of arthritis. 4.  Recommend following up with sleep medicine regarding sleep apnea 5.  Follow up in 3 months.  Thank you for allowing me to take part in the care of this patient.  AdaMetta ClinesO  CC: StaBilley GoslingD

## 2017-03-09 NOTE — Patient Instructions (Signed)
1.  Start nortriptyline 10mg  at bedtime.  If headaches not improved in one month, contact me and we can increase dose 2.  May take Tylenol or ibuprofen for severe headache but limit to no more than days out of the week to prevent rebound headache 3.  Try taking turmeric 500mg  twice daily to help reduce inflammation of arthritis. 4.  Recommend following up with sleep medicine regarding sleep apnea 5.  Follow up in 3 months.

## 2017-03-10 ENCOUNTER — Ambulatory Visit (INDEPENDENT_AMBULATORY_CARE_PROVIDER_SITE_OTHER): Payer: Medicare Other | Admitting: Pulmonary Disease

## 2017-03-10 ENCOUNTER — Telehealth: Payer: Self-pay | Admitting: Pulmonary Disease

## 2017-03-10 ENCOUNTER — Encounter: Payer: Self-pay | Admitting: Pulmonary Disease

## 2017-03-10 ENCOUNTER — Ambulatory Visit: Payer: Medicare Other | Admitting: Pulmonary Disease

## 2017-03-10 VITALS — BP 134/80 | HR 78 | Ht 63.0 in | Wt 196.0 lb

## 2017-03-10 DIAGNOSIS — J449 Chronic obstructive pulmonary disease, unspecified: Secondary | ICD-10-CM | POA: Diagnosis not present

## 2017-03-10 DIAGNOSIS — R911 Solitary pulmonary nodule: Secondary | ICD-10-CM

## 2017-03-10 DIAGNOSIS — J9809 Other diseases of bronchus, not elsewhere classified: Secondary | ICD-10-CM

## 2017-03-10 DIAGNOSIS — J432 Centrilobular emphysema: Secondary | ICD-10-CM

## 2017-03-10 DIAGNOSIS — G4733 Obstructive sleep apnea (adult) (pediatric): Secondary | ICD-10-CM

## 2017-03-10 MED ORDER — FLUTICASONE-UMECLIDIN-VILANT 100-62.5-25 MCG/INH IN AEPB: 1.0000 | INHALATION_SPRAY | Freq: Every day | RESPIRATORY_TRACT | 0 refills | Status: DC

## 2017-03-10 NOTE — Progress Notes (Signed)
Subjective:   PATIENT ID: [name] GENDER: female DOB: Jun 07, 1940, MRN: [id number]  Synopsis: Former patient of Dr. 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 She smoked 1/2 ppd for about 35 years, quit at age 77.  She like to avoid steroids when she has flares of her disease.  She will take them if she has to though.   HPI  Chief Complaint  Patient presents with  . Follow-up    previous [facility] patient, shortness of breath, wheezing, productive cough (white/thick)    [name] tells me she developed asthma 10 years ago. She says that she had a bad spell of bronchitis about 10 years ago, since then she has been having more trouble in the change of weather each year.  She says that she will start with a cough that makes her weak and fatigued and she will cough up mucus. She says that she will sometimes cough up thick mucus.  She takes albuterol and symbicort.  She says taht she will use the albuterol three times a day when her symptoms are worse.  She is only taking Symbicort one time per day.  She used to smoke 1/2 pack per day and quit 25 years ago.  She started  smoking at at [age].    She is a retired Education officer, museum.  She says that she took prednisone one time and she had a problem with it.  It made her nervous because of bad side effects her husband.   She still has some exertional dyspnea.  She has fatigue during the daytime.  She says that she was prescribed CPAP in the past but she did not use it is frequently as her insurance company desired.  This was taken away. She needed oxygen at home one time.    Past Medical History:  Diagnosis Date  . Adenomatous colon polyp   . Allergy   . Anxiety   . Asthma   . Chest pain   . COPD (chronic obstructive pulmonary disease) ([place])    pt is unsure if has been officially diagnosed  . Coronary artery disease    CABG '09- cathed 12/09, 9/10, 6/11, 3/14 and 12/13/16-  medical Rx  . Diabetes mellitus   . GERD (gastroesophageal reflux disease)   . Hiatal hernia   . Hyperlipidemia   . Hypertension   . Myocardial infarction ([place]) 2009  . PONV (postoperative nausea and vomiting)   . Schatzki's ring   . Shoulder injury    resolved after shoulder surgery  . Sleep apnea    not on cpap     Family History  Problem Relation Age of Onset  . Heart disease Maternal Grandfather   . Heart failure Maternal Grandfather   . Diabetes Maternal Grandfather   . Heart attack Father   . Diabetes Mother   . Rheum arthritis Sister   . Emphysema Paternal Uncle   . Esophageal cancer Brother 61       she said he was born with it  . Emphysema Paternal Aunt   . Neuropathy Neg Hx   . Multiple sclerosis Neg Hx   . Colon cancer Neg Hx   . Colon polyps Neg Hx   . Rectal cancer Neg Hx   . Stomach cancer Neg Hx      Social History   Socioeconomic History  . Marital status: Widowed    Spouse name: Not on file  .  Number of children: 4  . Years of education: Designer, jewellery  . Highest education level: Not on file  Social Needs  . Financial resource strain: Not hard at all  . Food insecurity - worry: Never true  . Food insecurity - inability: Never true  . Transportation needs - medical: No  . Transportation needs - non-medical: No  Occupational History  . Occupation: Retired  Tobacco Use  . Smoking status: Former Smoker    Packs/day: 0.50    Years: 21.00    Pack years: 10.50    Types: Cigarettes    Start date: 02/09/1955    Last attempt to quit: 02/09/1976    Years since quitting: 41.1  . Smokeless tobacco: Never Used  Substance and Sexual Activity  . Alcohol use: No    Alcohol/week: 0.0 oz  . Drug use: No  . Sexual activity: No  Other Topics Concern  . Not on file  Social History Narrative   Lives alone.  One story home.  Has 4 children.  Education: doctorate in theology.    Caffeine use: Drinks 1 cup coffee/day      Originally from [place]. Previously has lived  in [place]. Prior travel to [place], [place], [place], [place], [place], [place], [place], & [place]. Previously worked in [employer] She has a dog currently. No bird, mold, or hot tub exposure. She also pastors a church.      Allergies  Allergen Reactions  . Amlodipine Other (See Comments)    hallucinations   . Banana Nausea And Vomiting    Stomach pumped  . Co Q10 [Coenzyme Q10]     Body cramps  . Codeine     Hallucinate, loose identity and don't know who I am  . Morphine Other (See Comments)    Can not function, it immobilizes me   . Pravastatin     Hands locked up  . Repatha [Evolocumab]     myalgias  . Statins     Muscle cramps  . Sulfonamide Derivatives Swelling     Outpatient Medications Prior to Visit  Medication Sig Dispense Refill  . albuterol (PROVENTIL HFA;VENTOLIN HFA) 108 (90 BASE) MCG/ACT inhaler Inhale 2 puffs into the lungs every 6 (six) hours as needed. For shortness of breath. 1 Inhaler 0  . alum & mag hydroxide-simeth (MAALOX/MYLANTA) 200-200-20 MG/5ML suspension Take 30 mLs as needed by mouth for indigestion or heartburn.    Marland Kitchen aspirin EC 81 MG tablet Take 1 tablet (81 mg total) by mouth daily.    . baclofen (LIORESAL) 20 MG tablet Take 20 mg 2 (two) times daily by mouth.    . benzonatate (TESSALON) 100 MG capsule Take 100 mg by mouth 2 (two) times daily as needed for cough.     . Blood Glucose Monitoring Suppl (ONE TOUCH ULTRA MINI) w/Device KIT 3 (three) times daily. for testing  0  . budesonide-formoterol (SYMBICORT) 160-4.5 MCG/ACT inhaler Inhale 2 puffs into the lungs 2 (two) times daily. (Patient taking differently: Inhale 2 puffs 2 (two) times daily as needed into the lungs (shortness of breath). ) 2 Inhaler 0  . calcium carbonate (TUMS - DOSED IN MG ELEMENTAL CALCIUM) 500 MG chewable tablet Chew 2 tablets daily as needed by mouth for indigestion or heartburn.    . Cholecalciferol (VITAMIN D) 2000 UNITS tablet Take 2,000 Units by mouth daily.     . clonazePAM  (KLONOPIN) 0.5 MG tablet Take 0.5 mg daily by mouth.    . clopidogrel (PLAVIX) 75 MG  tablet Take 1 tablet (75 mg total) by mouth daily. 90 tablet 3  . Cyanocobalamin (B-12 COMPLIANCE INJECTION) 1000 MCG/ML KIT Inject 1,000 mcg every 30 (thirty) days as directed.    . cyclobenzaprine (FLEXERIL) 5 MG tablet TAKE 1 TABLET BY MOUTH AT BEDTIME 30 tablet 0  . diclofenac sodium (VOLTAREN) 1 % GEL Apply 2 g topically every 4 (four) hours. (Patient taking differently: Apply 2 g every 4 (four) hours as needed topically (muscle pain). )    . diltiazem (CARDIZEM CD) 240 MG 24 hr capsule Take 1 capsule (240 mg total) by mouth daily. 30 capsule 10  . fexofenadine (ALLEGRA) 180 MG tablet Take 1 tablet (180 mg total) by mouth daily. 30 tablet 6  . furosemide (LASIX) 40 MG tablet Take 40 mg every other day by mouth.     . insulin lispro protamine-insulin lispro (HUMALOG 75/25) (75-25) 100 UNIT/ML SUSP Inject 20-30 Units into the skin See admin instructions. Takes 20 units in the morning and 30  units with supper    . isosorbide mononitrate (IMDUR) 60 MG 24 hr tablet Take 1.5 tablets (90 mg total) by mouth daily. 135 tablet 3  . loperamide (IMODIUM) 2 MG capsule Take 2 mg by mouth as needed for diarrhea or loose stools.     . meclizine (ANTIVERT) 25 MG tablet Take 25 mg by mouth 2 (two) times daily as needed for dizziness.     . methocarbamol (ROBAXIN) 500 MG tablet Take 500 mg 2 (two) times daily by mouth.    . metoprolol succinate (TOPROL-XL) 50 MG 24 hr tablet Take 1 tablet (50 mg total) by mouth daily. 30 tablet 10  . montelukast (SINGULAIR) 5 MG chewable tablet Chew 1 tablet (5 mg total) by mouth at bedtime. 30 tablet 3  . Multiple Vitamin (MULTIVITAMIN WITH MINERALS) TABS Take 1 tablet by mouth daily.    . Nebulizers (COMPRESSOR/NEBULIZER) MISC Use with albuterol 1 each 0  . nitroGLYCERIN (NITROSTAT) 0.4 MG SL tablet Place 1 tablet (0.4 mg total) under the tongue every 5 (five) minutes as needed. (Patient  taking differently: Place 0.4 mg every 5 (five) minutes as needed under the tongue for chest pain. ) 25 tablet 3  . nortriptyline (PAMELOR) 10 MG capsule Take 1 capsule (10 mg total) by mouth at bedtime. 30 capsule 2  . ONE TOUCH ULTRA TEST test strip CHECK BLOOD SUGAR TWICE A DAY DX: E11.9  3  . ONETOUCH DELICA LANCETS 62Z MISC 3 (three) times daily. for testing  0  . Pancrelipase, Lip-Prot-Amyl, 24000 units CPEP Take 2 capsules (48,000 Units total) by mouth 3 (three) times daily with meals. Take 1 capsule with a snack 210 capsule 6  . pantoprazole (PROTONIX) 40 MG tablet Take 1 tablet (40 mg total) by mouth 2 (two) times daily before a meal. 180 tablet 3  . potassium chloride SA (K-DUR,KLOR-CON) 20 MEQ tablet Take 20 mEq daily by mouth.     . promethazine (PHENERGAN) 25 MG tablet Take 1 tablet (25 mg total) by mouth every 6 (six) hours as needed for nausea or vomiting. 30 tablet 0  . Spacer/Aero-Holding Chambers (AEROCHAMBER MV) inhaler Use as instructed 1 each 0  . traZODone (DESYREL) 50 MG tablet Take 1-2 tablets (50-100 mg total) at bedtime as needed by mouth for sleep. 180 tablet 0  . gabapentin (NEURONTIN) 100 MG capsule Take 200 mg at night (Patient not taking: Reported on 03/09/2017) 60 capsule 5   Facility-Administered Medications Prior to Visit  Medication  Dose Route Frequency Provider Last Rate Last Dose  . cyanocobalamin ((VITAMIN B-12)) injection 1,000 mcg  1,000 mcg Intramuscular Q30 days Binnie Rail, MD   1,000 mcg at 03/26/16 1658    Review of Systems  Constitutional: Negative for malaise/fatigue and weight loss.  HENT: Negative for congestion, nosebleeds and sore throat.   Respiratory: Positive for cough, sputum production, shortness of breath and wheezing.   Cardiovascular: Negative for orthopnea, claudication and leg swelling.  Neurological: Negative for weakness.      Objective:  Physical Exam   Vitals:   03/10/17 1404  BP: 134/80  Pulse: 78  SpO2: 98%    Weight: 196 lb (88.9 kg)  Height: '5\' 3"'  (1.6 m)   Gen: chronically ill appearing HENT: OP clear, TM's clear, neck supple PULM: CTA B, normal percussion CV: RRR, no mgr, trace edema GI: BS+, soft, nontender Derm: no cyanosis or rash Psyche: normal mood and affect   CBC    Component Value Date/Time   WBC 6.1 01/04/2017 1505   RBC 3.89 01/04/2017 1505   HGB 9.3 (L) 01/04/2017 1505   HGB 10.6 (L) 12/10/2016 1346   HCT 31.3 (L) 01/04/2017 1505   HCT 34.1 12/10/2016 1346   PLT 271 01/04/2017 1505   PLT 270 12/10/2016 1346   MCV 80.5 01/04/2017 1505   MCV 78 (L) 12/10/2016 1346   MCH 23.9 (L) 01/04/2017 1505   MCHC 29.7 (L) 01/04/2017 1505   RDW 16.4 (H) 01/04/2017 1505   RDW 16.1 (H) 12/10/2016 1346   LYMPHSABS 2.2 06/21/2016 1628   MONOABS 0.6 05/19/2016 1152   EOSABS 0.1 06/21/2016 1628   BASOSABS 0.0 06/21/2016 1628    PFT 12/23/16: FVC 1.44 L (72%) FEV1 0.96 L (63%) FEV1/FVC 0.67 FEF 25-75 0.55 L (42%) negative bronchodilator response TLC 3.85 L (78%) RV 102% DLCO uncorrected 45% 06/04/15: FVC 1.57 L (77%) FEV1 1.01 L (64%) FEV1/FVC 0.65 FEF 25-75 0.54 L (39%) negative bronchodilator response 02/21/15: FVC 1.60 L (78%) FEV1 1.03 L (65%) FEV1/FVC 0.64 FEF 25-75 0.51 L (37%) positive bronchodilator response 11/20/14: FVC 1.44 L (70%) FEV1 0.91 L (57%) FEV1/FVC 0.63 FEF 25-75 0.47 L (33%) positive bronchodilator response TLC 3.92 L (79%) RV 103% DLCO uncorrected 64% 11/12/13: FVC 1.57 L (74%) FEV1 1.09 L (67%) FEV1/FVC 0.70 FEF 25-75 0.72 L (50%) negative bronchodilator response 08/04/11: FVC 1.61 L (60%) FEV1 0.89 L (48%) FEV1/FVC 0.56 FEF 25-75 0.29 L (14%) positive bronchodilator response TLC 3.67 L (80%) RV 103% ERV 46% DLCO corrected 38%  6MWT 11/20/14:  Walked 336 meters / Baseline Sat 100% on RA / Nadir Sat 99% on RA (pt c/o pain in her right neck w/ dyspnea)  IMAGING November 2018 CT angiogram chest images independently reviewed showing centrilobular predominant  emphysema, mild in an upper lobe distribution, no PE, there is a 5 mm right upper lobe nodule.  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 LIMA to LAD is patent.  Prox RCA lesion is 25% stenosed. SVG to PDA is occluded.  Mid RCA lesion is 25% stenosed.  Circumflex is a large tortuous vessel.  Ost 1st Diag lesion is 100% stenosed. SVG to diagonal is patent.  The left ventricular systolic function is normal.  LV end diastolic pressure is normal.  The left ventricular ejection fraction is 55-65% by visual estimate.  There is no aortic valve stenosis.  TTE (10/06/11): LV normal in size. Normal regional wall motion. EF 60-65%. Grade  1 diastolic dysfunction. LA & RA normal in size. 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 RV normal in size and function. RVSP 19 mmHg. No aortic stenosis or regurgitation. No mitral stenosis or regurgitation. No pulmonic stenosis. Trivial tricuspid regurgitation. No pericardial effusion.  LABS 03/13/15 Procalcitonin:  <0.1 CBC: 10.9/11.8/37.3/245  10/08/14 IgG: 1060 IgM: 112 IgA: 272 IgE: 5 CBC: 6.1/11.3/36.0/293 Differential: Eos 0.3 (4.5%)  RAST panel: Negative Aspergillus antigen: <0.1  1/26/9 ABG:  7.39/44/113  Hospitalization records from November 2018 reviewed where she was hospitalized for chest pain     Assessment & Plan:   COPD with asthma ([place])  Centrilobular emphysema ([place])  Bronchomalacia, acquired  OSA (obstructive sleep apnea)  Pulmonary nodule  Discussion: [name] presents to establish care with me today for a COPD overlap asthma syndrome.  She has centrilobular emphysema seen on the CT scan of her chest.  She also has bronchomalacia.  She says that her symptoms are worse with the change of weather which fit with an asthma-like phenotype.  However she continues to have some shortness of breath.  In the past she was not compliant with CPAP but she says that she has been  having more fatigue lately and she is interested in restarting that.  Further, she has bronchomalacia seen on the CT scan of her chest so I think positive pressure would benefit her greatly.  She also has a pulmonary nodule seen on the CT scan of her chest from November 2018.  Given her history of smoking this needs to be followed closely.  Plan: COPD-asthma overlap syndrome: Stop Symbicort Take the Trelegy sample we gave you 1 puff daily no matter how you feel, call me towards the end of the sample and let me know if you think that it is helpful and we can change her prescription Keep using albuterol as needed for chest tightness wheezing or shortness of breath Stay active try to exercise regularly  Pulmonary nodule: We will repeat a CT scan of the chest in May 2019  Bronchomalacia: This means that your airways collapse, particularly when you sleep: We will try to get use CPAP again  Obstructive sleep apnea: Given the fatigue you have experienced slightly we will order a home sleep study to requalify you for a CPAP device  We will see you back in May 2019    Current Outpatient Medications:  .  albuterol (PROVENTIL HFA;VENTOLIN HFA) 108 (90 BASE) MCG/ACT inhaler, Inhale 2 puffs into the lungs every 6 (six) hours as needed. For shortness of breath., Disp: 1 Inhaler, Rfl: 0 .  alum & mag hydroxide-simeth (MAALOX/MYLANTA) 200-200-20 MG/5ML suspension, Take 30 mLs as needed by mouth for indigestion or heartburn., Disp: , Rfl:  .  aspirin EC 81 MG tablet, Take 1 tablet (81 mg total) by mouth daily., Disp: , Rfl:  .  baclofen (LIORESAL) 20 MG tablet, Take 20 mg 2 (two) times daily by mouth., Disp: , Rfl:  .  benzonatate (TESSALON) 100 MG capsule, Take 100 mg by mouth 2 (two) times daily as needed for cough. , Disp: , Rfl:  .  Blood Glucose Monitoring Suppl (ONE TOUCH ULTRA MINI) w/Device KIT, 3 (three) times daily. for testing, Disp: , Rfl: 0 .  budesonide-formoterol (SYMBICORT) 160-4.5  MCG/ACT inhaler, Inhale 2 puffs into the lungs 2 (two) times daily. (Patient taking differently: Inhale 2 puffs 2 (two) times daily as needed into the lungs (shortness of breath). ), Disp: 2 Inhaler, Rfl: 0 .  calcium carbonate (TUMS -  DOSED IN MG ELEMENTAL CALCIUM) 500 MG chewable tablet, Chew 2 tablets daily as needed by mouth for indigestion or heartburn., Disp: , Rfl:  .  Cholecalciferol (VITAMIN D) 2000 UNITS tablet, Take 2,000 Units by mouth daily. , Disp: , Rfl:  .  clonazePAM (KLONOPIN) 0.5 MG tablet, Take 0.5 mg daily by mouth., Disp: , Rfl:  .  clopidogrel (PLAVIX) 75 MG tablet, Take 1 tablet (75 mg total) by mouth daily., Disp: 90 tablet, Rfl: 3 .  Cyanocobalamin (B-12 COMPLIANCE INJECTION) 1000 MCG/ML KIT, Inject 1,000 mcg every 30 (thirty) days as directed., Disp: , Rfl:  .  cyclobenzaprine (FLEXERIL) 5 MG tablet, TAKE 1 TABLET BY MOUTH AT BEDTIME, Disp: 30 tablet, Rfl: 0 .  diclofenac sodium (VOLTAREN) 1 % GEL, Apply 2 g topically every 4 (four) hours. (Patient taking differently: Apply 2 g every 4 (four) hours as needed topically (muscle pain). ), Disp: , Rfl:  .  diltiazem (CARDIZEM CD) 240 MG 24 hr capsule, Take 1 capsule (240 mg total) by mouth daily., Disp: 30 capsule, Rfl: 10 .  fexofenadine (ALLEGRA) 180 MG tablet, Take 1 tablet (180 mg total) by mouth daily., Disp: 30 tablet, Rfl: 6 .  furosemide (LASIX) 40 MG tablet, Take 40 mg every other day by mouth. , Disp: , Rfl:  .  insulin lispro protamine-insulin lispro (HUMALOG 75/25) (75-25) 100 UNIT/ML SUSP, Inject 20-30 Units into the skin See admin instructions. Takes 20 units in the morning and 30  units with supper, Disp: , Rfl:  .  isosorbide mononitrate (IMDUR) 60 MG 24 hr tablet, Take 1.5 tablets (90 mg total) by mouth daily., Disp: 135 tablet, Rfl: 3 .  loperamide (IMODIUM) 2 MG capsule, Take 2 mg by mouth as needed for diarrhea or loose stools. , Disp: , Rfl:  .  meclizine (ANTIVERT) 25 MG tablet, Take 25 mg by mouth 2 (two)  times daily as needed for dizziness. , Disp: , Rfl:  .  methocarbamol (ROBAXIN) 500 MG tablet, Take 500 mg 2 (two) times daily by mouth., Disp: , Rfl:  .  metoprolol succinate (TOPROL-XL) 50 MG 24 hr tablet, Take 1 tablet (50 mg total) by mouth daily., Disp: 30 tablet, Rfl: 10 .  montelukast (SINGULAIR) 5 MG chewable tablet, Chew 1 tablet (5 mg total) by mouth at bedtime., Disp: 30 tablet, Rfl: 3 .  Multiple Vitamin (MULTIVITAMIN WITH MINERALS) TABS, Take 1 tablet by mouth daily., Disp: , Rfl:  .  Nebulizers (COMPRESSOR/NEBULIZER) MISC, Use with albuterol, Disp: 1 each, Rfl: 0 .  nitroGLYCERIN (NITROSTAT) 0.4 MG SL tablet, Place 1 tablet (0.4 mg total) under the tongue every 5 (five) minutes as needed. (Patient taking differently: Place 0.4 mg every 5 (five) minutes as needed under the tongue for chest pain. ), Disp: 25 tablet, Rfl: 3 .  nortriptyline (PAMELOR) 10 MG capsule, Take 1 capsule (10 mg total) by mouth at bedtime., Disp: 30 capsule, Rfl: 2 .  ONE TOUCH ULTRA TEST test strip, CHECK BLOOD SUGAR TWICE A DAY DX: E11.9, Disp: , Rfl: 3 .  ONETOUCH DELICA LANCETS 40J MISC, 3 (three) times daily. for testing, Disp: , Rfl: 0 .  Pancrelipase, Lip-Prot-Amyl, 24000 units CPEP, Take 2 capsules (48,000 Units total) by mouth 3 (three) times daily with meals. Take 1 capsule with a snack, Disp: 210 capsule, Rfl: 6 .  pantoprazole (PROTONIX) 40 MG tablet, Take 1 tablet (40 mg total) by mouth 2 (two) times daily before a meal., Disp: 180 tablet, Rfl: 3 .  potassium chloride SA (K-DUR,KLOR-CON) 20 MEQ tablet, Take 20 mEq daily by mouth. , Disp: , Rfl:  .  promethazine (PHENERGAN) 25 MG tablet, Take 1 tablet (25 mg total) by mouth every 6 (six) hours as needed for nausea or vomiting., Disp: 30 tablet, Rfl: 0 .  Spacer/Aero-Holding Chambers (AEROCHAMBER MV) inhaler, Use as instructed, Disp: 1 each, Rfl: 0 .  traZODone (DESYREL) 50 MG tablet, Take 1-2 tablets (50-100 mg total) at bedtime as needed by mouth for  sleep., Disp: 180 tablet, Rfl: 0  Current Facility-Administered Medications:  .  cyanocobalamin ((VITAMIN B-12)) injection 1,000 mcg, 1,000 mcg, Intramuscular, Q30 days, Binnie Rail, MD, 1,000 mcg at 03/26/16 1658

## 2017-03-10 NOTE — Telephone Encounter (Signed)
We do not have a cancellation list Pt can call to check for appt Will also route to [place] to assist patient Did go ahead and schedule pt for appt with BQ at next available in >> 5-6 weeks on 3.11.19 @ 1445

## 2017-03-10 NOTE — Patient Instructions (Signed)
COPD-asthma overlap syndrome: Stop Symbicort Take the Trelegy sample we gave you 1 puff daily no matter how you feel, call me towards the end of the sample and let me know if you think that it is helpful and we can change her prescription Keep using albuterol as needed for chest tightness wheezing or shortness of breath Stay active try to exercise regularly  Pulmonary nodule: We will repeat a CT scan of the chest in May 2019  Bronchomalacia: This means that your airways collapse, particularly when you sleep: We will try to get use CPAP again  Obstructive sleep apnea: Given the fatigue you have experienced slightly we will order a home sleep study to requalify you for a CPAP device  We will see you back in May 2019

## 2017-03-10 NOTE — Telephone Encounter (Signed)
If I have an opening come open sooner she can have it

## 2017-03-10 NOTE — Telephone Encounter (Signed)
Dr. Lake Bells, please advise on this for patient.  Thanks!

## 2017-03-14 ENCOUNTER — Encounter: Payer: Self-pay | Admitting: Physician Assistant

## 2017-03-14 NOTE — Progress Notes (Signed)
Cardiology Office Note    Date:  03/15/2017   ID:  [name], DOB March 25, 1940, MRN [id number]  PCP:  Binnie Rail, [place]  Cardiologist:  Dr. Acie Fredrickson  Chief Complaint: CAD  follow up  History of Present Illness:   [name] is a 77 y.o. female with hx of  CAD s/p CABG, HTN, HLD (statin intolerance, she is followed in lipid clinic), DM, COPD-Asthma overlap syndrome, former smoker (She smoked 1/2 ppd for about 35 years, quit at age 65) GERD, OSA and chronic diastolic CHF presents for follow up.   History of CAD. She is s/p CABG x 3 by Dr Cyndia Bent in 2009. She has had subsequent caths 12/09, 9/10, 6/11, 3/14 and most recently 12/14/16. She has known occluded SVG-PDA and patent native RCA. Her SVG-Dx, and LIMA to LAD are patent. The plan has been for medical Rx.  After her cath, she had episode of syncope (without injury). 30 days monitor did not showed any arrhthymias.   Last echo 02/2017 showed normal LVEF of 65-70%, no WM abnormality, grade 2 DD with high ventricular filling pressure, mild MS and moderate pulmonary hypertension.   Carotid doppler 02/18/2017 showed mild plaque bilaterally.   Seen by Pulmonary 03/10/17. Recommended to use CPAP given hx of bronchomalacia (aiways collapse while sleeping). Ordered home sleep study.   Here today for follow up. She walks 3 miles 3 times/week without any issue. She has chronic orthopnea. Compliant with low sodium diet. No chest pain, dizziness or melena. No recurrent syncope.   Past Medical History:  Diagnosis Date  . Adenomatous colon polyp   . Allergy   . Anxiety   . Asthma   . Chest pain   . Chronic diastolic CHF (congestive heart failure) ([place])   . COPD (chronic obstructive pulmonary disease) ([place])    pt is unsure if has been officially diagnosed  . Coronary artery disease    CABG '09- cathed 12/09, 9/10, 6/11, 3/14 and 12/13/16- medical Rx  . Diabetes mellitus   . GERD (gastroesophageal reflux disease)   . Hiatal hernia   .  Hyperlipidemia   . Hypertension   . Myocardial infarction ([place]) 2009  . PONV (postoperative nausea and vomiting)   . Schatzki's ring   . Shoulder injury    resolved after shoulder surgery  . Sleep apnea    not on cpap    Past Surgical History:  Procedure Laterality Date  . ABDOMINAL HYSTERECTOMY    . APPENDECTOMY     came out with Hysterectomy  . CARDIAC CATHETERIZATION  07/23/2009   EF 60%  . CARDIAC CATHETERIZATION  10/11/2008  . CARDIAC CATHETERIZATION  03/01/2007   EF 75-80%  . CARDIOVASCULAR STRESS TEST  11/15/2007   EF 60%  . COLONOSCOPY    . CORONARY ARTERY BYPASS GRAFT     SEVERELY DISEASED SAPHENOUS VEIN GRAFT TO THE RIGHT CORONARY ARTERY BUT WITH FAIRLY WELL PRESERVED FLOW TO THE DISTAL RIGHT CORONARY ARTERY FROM THE NATIVE CIRCULATION-RESTART  CATH IN JUNE 2000, REVEALS MILD/MODERATE  CAD WITH GOOD FLOW DOWN HER LAD  . ESOPHAGOGASTRODUODENOSCOPY    . EYE SURGERY     bilateral cataract surgery with lens implant  . LEFT HEART CATH AND CORS/GRAFTS ANGIOGRAPHY N/A 12/14/2016   Procedure: LEFT HEART CATH AND CORS/GRAFTS ANGIOGRAPHY;  Surgeon: Jettie Booze, MD;  Location: [facility];  Service: Cardiovascular;  Laterality: N/A;  . lense removal Left   . POLYPECTOMY    . ROTATOR CUFF  REPAIR     right and left  . TONSILLECTOMY     age 44  . 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 tumor removed kidney    . UPPER GASTROINTESTINAL ENDOSCOPY    . US ECHOCARDIOGRAPHY  03/08/2008   EF 55-60%    Current Medications: Prior to Admission medications   Medication Sig Start Date End Date Taking? Authorizing Provider  albuterol (PROVENTIL HFA;VENTOLIN HFA) 108 (90 BASE) MCG/ACT inhaler Inhale 2 puffs into the lungs every 6 (six) hours as needed. For shortness of breath. 03/06/12   Elsie Stain, MD  alum & mag hydroxide-simeth  (MAALOX/MYLANTA) 200-200-20 MG/5ML suspension Take 30 mLs as needed by mouth for indigestion or heartburn.    [provider]  aspirin EC 81 MG tablet Take 1 tablet (81 mg total) by mouth daily. 12/10/16   Richardson Dopp T, PA-C  baclofen (LIORESAL) 20 MG tablet Take 20 mg 2 (two) times daily by mouth.    [provider]  benzonatate (TESSALON) 100 MG capsule Take 100 mg by mouth 2 (two) times daily as needed for cough.     [provider]  Blood Glucose Monitoring Suppl (ONE TOUCH ULTRA MINI) w/Device KIT 3 (three) times daily. for testing 02/26/15   [provider]  budesonide-formoterol (SYMBICORT) 160-4.5 MCG/ACT inhaler Inhale 2 puffs into the lungs 2 (two) times daily. Patient taking differently: Inhale 2 puffs 2 (two) times daily as needed into the lungs (shortness of breath).  08/13/16   Javier Glazier, MD  calcium carbonate (TUMS - DOSED IN MG ELEMENTAL CALCIUM) 500 MG chewable tablet Chew 2 tablets daily as needed by mouth for indigestion or heartburn.    [provider]  Cholecalciferol (VITAMIN D) 2000 UNITS tablet Take 2,000 Units by mouth daily.     [provider]  clonazePAM (KLONOPIN) 0.5 MG tablet Take 0.5 mg daily by mouth.    [provider]  clopidogrel (PLAVIX) 75 MG tablet Take 1 tablet (75 mg total) by mouth daily. 12/10/16 12/10/17  Richardson Dopp T, PA-C  Cyanocobalamin (B-12 COMPLIANCE INJECTION) 1000 MCG/ML KIT Inject 1,000 mcg every 30 (thirty) days as directed.    [provider]  cyclobenzaprine (FLEXERIL) 5 MG tablet TAKE 1 TABLET BY MOUTH AT BEDTIME 01/03/17   Melvenia Beam, MD  diclofenac sodium (VOLTAREN) 1 % GEL Apply 2 g topically every 4 (four) hours. Patient taking differently: Apply 2 g every 4 (four) hours as needed topically (muscle pain).  03/20/16   Debbe Odea, MD  diltiazem (CARDIZEM CD) 240 MG 24 hr capsule Take 1 capsule (240 mg total) by mouth daily. 08/13/14   Nahser, Wonda Cheng, MD    fexofenadine (ALLEGRA) 180 MG tablet Take 1 tablet (180 mg total) by mouth daily. 09/04/15   Javier Glazier, MD  Fluticasone-Umeclidin-Vilant (TRELEGY ELLIPTA) 100-62.5-25 MCG/INH AEPB Inhale 1 puff into the lungs daily. 03/10/17   Juanito Doom, MD  furosemide (LASIX) 40 MG tablet Take 40 mg every other day by mouth.     [provider]  insulin lispro protamine-insulin lispro (HUMALOG 75/25) (75-25) 100 UNIT/ML SUSP Inject 20-30 Units into the skin See admin instructions. Takes 20 units in the morning and 30  units with supper    [provider]  isosorbide mononitrate (IMDUR) 60 MG 24 hr tablet Take 1.5 tablets (90 mg  total) by mouth daily. 12/10/16 03/10/17  Richardson Dopp T, PA-C  loperamide (IMODIUM) 2 MG capsule Take 2 mg by mouth as needed for diarrhea or loose stools.     [provider]  meclizine (ANTIVERT) 25 MG tablet Take 25 mg by mouth 2 (two) times daily as needed for dizziness.     [provider]  methocarbamol (ROBAXIN) 500 MG tablet Take 500 mg 2 (two) times daily by mouth.    [provider]  metoprolol succinate (TOPROL-XL) 50 MG 24 hr tablet Take 1 tablet (50 mg total) by mouth daily. 03/25/16   Nahser, Wonda Cheng, MD  montelukast (SINGULAIR) 5 MG chewable tablet Chew 1 tablet (5 mg total) by mouth at bedtime. 10/08/14   Javier Glazier, MD  Multiple Vitamin (MULTIVITAMIN WITH MINERALS) TABS Take 1 tablet by mouth daily.    [provider]  Nebulizers (COMPRESSOR/NEBULIZER) MISC Use with albuterol 04/24/14   Elsie Stain, MD  nitroGLYCERIN (NITROSTAT) 0.4 MG SL tablet Place 1 tablet (0.4 mg total) under the tongue every 5 (five) minutes as needed. Patient taking differently: Place 0.4 mg every 5 (five) minutes as needed under the tongue for chest pain.  12/10/16   Richardson Dopp T, PA-C  nortriptyline (PAMELOR) 10 MG capsule Take 1 capsule (10 mg total) by mouth at bedtime. 03/09/17   Tomi Likens, Adam R, DO  ONE TOUCH ULTRA  TEST test strip CHECK BLOOD SUGAR TWICE A DAY DX: E11.9 03/26/15   [provider]  [facility] DELICA LANCETS 48A MISC 3 (three) times daily. for testing 02/26/15   [provider]  Pancrelipase, Lip-Prot-Amyl, 24000 units CPEP Take 2 capsules (48,000 Units total) by mouth 3 (three) times daily with meals. Take 1 capsule with a snack 08/27/15   Ladene Artist, MD  pantoprazole (PROTONIX) 40 MG tablet Take 1 tablet (40 mg total) by mouth 2 (two) times daily before a meal. 12/10/16   Weaver, Nicki Reaper T, PA-C  potassium chloride SA (K-DUR,KLOR-CON) 20 MEQ tablet Take 20 mEq daily by mouth.     [provider]  promethazine (PHENERGAN) 25 MG tablet Take 1 tablet (25 mg total) by mouth every 6 (six) hours as needed for nausea or vomiting. 03/09/17   Pieter Partridge, DO  Spacer/Aero-Holding Chambers (AEROCHAMBER MV) inhaler Use as instructed 08/13/16   Javier Glazier, MD  traZODone (DESYREL) 50 MG tablet Take 1-2 tablets (50-100 mg total) at bedtime as needed by mouth for sleep. 12/28/16   Binnie Rail, MD    Allergies:   Amlodipine; Banana; Co q10 [coenzyme q10]; Codeine; Morphine; Pentazocine; Pravastatin; Repatha [evolocumab]; Statins; Sulfa antibiotics; Sulfonamide derivatives; and Tramadol hcl   Social History   Socioeconomic History  . Marital status: Widowed    Spouse name: None  . Number of children: 4  . Years of education: Designer, jewellery  . Highest education level: None  Social Needs  . Financial resource strain: Not hard at all  . Food insecurity - worry: Never true  . Food insecurity - inability: Never true  . Transportation needs - medical: No  . Transportation needs - non-medical: No  Occupational History  . Occupation: Retired  Tobacco Use  . Smoking status: Former Smoker    Packs/day: 0.50    Years: 21.00    Pack years: 10.50    Types: Cigarettes    Start date: 02/09/1955    Last attempt to quit: 02/09/1976    Years since quitting: 41.1  . Smokeless tobacco:  Never Used  Substance and Sexual Activity  . Alcohol use: No    Alcohol/week: 0.0 oz  . Drug use: No  . Sexual activity: No  Other Topics Concern  . None  Social History Narrative   Lives alone.  One story home.  Has 4 children.  Education: doctorate in theology.    Caffeine use: Drinks 1 cup coffee/day      Originally from [place]. Previously has lived in [place]. Prior travel to [place], [place], [place], [place], [place], [place], [place], & [place]. Previously worked in [employer] She has a dog currently. No bird, mold, or hot tub exposure. She also pastors a church.      Family History:  The patient's family history includes Diabetes in her maternal grandfather and mother; Emphysema in her paternal aunt and paternal uncle; Esophageal cancer (age of onset: 97) in her brother; Heart attack in her father; Heart disease in her maternal grandfather; Heart failure in her maternal grandfather; Rheum arthritis in her sister.   ROS:   Please see the history of present illness.    ROS All other systems reviewed and are negative.   PHYSICAL EXAM:   VS:  BP 140/86   Pulse 86   Ht '5\' 3"'  (1.6 m)   Wt 202 lb 12.8 oz (92 kg)   LMP  (LMP Unknown)   SpO2 98%   BMI 35.92 kg/m    GEN: Well nourished, well developed, in no acute distress  HEENT: normal  Neck: no JVD, carotid bruits, or masses Cardiac: RRR; no murmurs, rubs, or gallops, 1 + BL edema  Respiratory: normal work of breathing diminished breath sound throughout without wheezing or rales GI: soft, nontender, nondistended, + BS MS: no deformity or atrophy  Skin: warm and dry, no rash Neuro:  Alert and Oriented x 3, Strength and sensation are intact Psych: euthymic mood, full affect  Wt Readings from Last 3 Encounters:  03/15/17 202 lb 12.8 oz (92 kg)  03/10/17 196 lb (88.9 kg)  03/09/17 199 lb 6 oz (90.4 kg)      Studies/Labs Reviewed:   EKG:  EKG is not ordered today.    Recent Labs: 05/19/2016: TSH 1.25 06/21/2016: NT-Pro BNP  128 06/28/2016: Magnesium 2.0 12/22/2016: ALT 12 01/04/2017: B Natriuretic Peptide 315.0; BUN 11; Creatinine, Ser 0.71; Hemoglobin 9.3; Platelets 271; Potassium 3.9; Sodium 144   Lipid Panel    Component Value Date/Time   CHOL 119 12/22/2016 0913   TRIG 71 12/22/2016 0913   HDL 43 12/22/2016 0913   CHOLHDL 2.8 12/22/2016 0913   CHOLHDL 4.4 10/06/2011 0500   VLDL 22 10/06/2011 0500   LDLCALC 62 12/22/2016 0913    Additional studies/ records that were reviewed today include:   Echocardiogram: 02/17/17 Study Conclusions  - Left ventricle: The cavity size was normal. Systolic function was   vigorous. The estimated ejection fraction was in the range of 65%   to 70%. Wall motion was normal; there were no regional wall   motion abnormalities. Features are consistent with a pseudonormal   left ventricular filling pattern, with concomitant abnormal   relaxation and increased filling pressure (grade 2 diastolic   dysfunction). Doppler parameters are consistent with high   ventricular filling pressure. - Aortic valve: Trileaflet; mildly thickened, mildly calcified   leaflets. Valve area (VTI): 2.58 cm^2. Valve area (Vmax): 1.94   cm^2. Valve area (Vmean): 2.19 cm^2. - Mitral valve: Severely calcified annulus. Mild diffuse thickening   and calcification of the anterior  leaflet and posterior leaflet.   The findings are consistent with mild stenosis. Valve area by   pressure half-time: 2 cm^2. Valve area by continuity equation   (using LVOT flow): 2.26 cm^2. - Left atrium: The atrium was mildly dilated. - Pulmonic valve: There was trivial regurgitation. - Pulmonary arteries: PA peak pressure: 44 mm Hg (S).  Impressions:  - The right ventricular systolic pressure was increased consistent   with moderate pulmonary hypertension.    LEFT HEART CATH AND CORS/GRAFTS ANGIOGRAPHY   12/14/16  Conclusion     Prox LAD lesion is 80% stenosed. LIMA to LAD is patent.  Prox RCA lesion is  25% stenosed. SVG to PDA is occluded.  Mid RCA lesion is 25% stenosed.  Circumflex is a large tortuous vessel.  Ost 1st Diag lesion is 100% stenosed. SVG to diagonal is patent.  The left ventricular systolic function is normal.  LV end diastolic pressure is normal.  The left ventricular ejection fraction is 55-65% by visual estimate.  There is no aortic valve stenosis.   Continue aggressive medical therapy.    Diagnostic Diagram        ASSESSMENT & PLAN:    1. CAD s/p CABG - Last cath 12/2016 as above>>medical management. No angina. Continue walking regimen. Continue ASA, plavix, Imdur and BB.  2. Acute on chronic diastolic CHF - she has chronic dyspnea. Some of pulmonary component. Noted edema on exam. Previously Dr. Acie Fredrickson has recommended Increasing lasix to 31m qd given elevated EDP. However she is still taking every other day. - Given edema and  ventricular filling pressure on recent echo>>> will increase lasix to 491mqd. Continue Kdur 2067mqd. Check BMET today and adjust further.   3. HTN - Boderline today. Recently noted elevated BP during office visit with other provider. Will add ACE/ARB pending BMET today>>L then Hypertension clinic visit. Encouraged to bring all medications and BP cuff. BP of 150s/90s at home.   4. HLD - Stain intolerance.   5. OSA  - pending sleep study. Reccommended CPAP for bronchomalacia by pulmonologist.   6. Syncope - occurred after cath in 12/2014. She was attending church. Work up negative as described above. No reoccurrence. Advised not to drive 6 months from date of syncope.   Medication Adjustments/Labs and Tests Ordered: Current medicines are reviewed at length with the patient today.  Concerns regarding medicines are outlined above.  Medication changes, Labs and Tests ordered today are listed in the Patient Instructions below. Patient Instructions  Medication Instructions:  Your physician has recommended you make the  following change in your medication:  1.  INCREASE the Lasix to 40 mg daily  Labwork: TODAY:  BMET  Testing/Procedures: None ordered  Follow-Up: Your physician recommends that you schedule a follow-up appointment in: 3 MONTHS WITH DR. NAHAcie FredricksonAny Other Special Instructions Will Be Listed Below (If Applicable).     If you need a refill on your cardiac medications before your next appointment, please call your pharmacy.      SigJarrett SohoA [place]/06/2017 2:10 PM    [facility] [id number]  [phone]: [phone]: [phone]

## 2017-03-15 ENCOUNTER — Ambulatory Visit (INDEPENDENT_AMBULATORY_CARE_PROVIDER_SITE_OTHER): Payer: Medicare Other | Admitting: Physician Assistant

## 2017-03-15 ENCOUNTER — Encounter: Payer: Self-pay | Admitting: Physician Assistant

## 2017-03-15 VITALS — BP 140/86 | HR 86 | Ht 63.0 in | Wt 202.8 lb

## 2017-03-15 DIAGNOSIS — G4733 Obstructive sleep apnea (adult) (pediatric): Secondary | ICD-10-CM

## 2017-03-15 DIAGNOSIS — J449 Chronic obstructive pulmonary disease, unspecified: Secondary | ICD-10-CM | POA: Diagnosis not present

## 2017-03-15 DIAGNOSIS — I5033 Acute on chronic diastolic (congestive) heart failure: Secondary | ICD-10-CM

## 2017-03-15 DIAGNOSIS — R55 Syncope and collapse: Secondary | ICD-10-CM

## 2017-03-15 DIAGNOSIS — I10 Essential (primary) hypertension: Secondary | ICD-10-CM | POA: Diagnosis not present

## 2017-03-15 DIAGNOSIS — I25709 Atherosclerosis of coronary artery bypass graft(s), unspecified, with unspecified angina pectoris: Secondary | ICD-10-CM | POA: Diagnosis not present

## 2017-03-15 DIAGNOSIS — E785 Hyperlipidemia, unspecified: Secondary | ICD-10-CM | POA: Diagnosis not present

## 2017-03-15 MED ORDER — FUROSEMIDE 40 MG PO TABS: 40.0000 mg | ORAL_TABLET | Freq: Every day | ORAL | 3 refills | Status: DC

## 2017-03-15 NOTE — Patient Instructions (Signed)
Medication Instructions:  Your physician has recommended you make the following change in your medication:  1.  INCREASE the Lasix to 40 mg daily  Labwork: TODAY:  BMET  Testing/Procedures: None ordered  Follow-Up: Your physician recommends that you schedule a follow-up appointment in: 3 MONTHS WITH DR. Acie Fredrickson   Any Other Special Instructions Will Be Listed Below (If Applicable).     If you need a refill on your cardiac medications before your next appointment, please call your pharmacy.

## 2017-03-15 NOTE — Telephone Encounter (Signed)
I'm confused, my note says come back in May

## 2017-03-15 NOTE — Telephone Encounter (Signed)
Attempt to call patient but her VM was full. Will call back later.

## 2017-03-15 NOTE — Telephone Encounter (Signed)
[name], please advise if the pt can be seen sooner. Thanks.

## 2017-03-15 NOTE — Telephone Encounter (Signed)
BQ please advise if you'd like to work pt in sooner as requested, or keep scheduled rov on 3/11.  Thanks.

## 2017-03-16 ENCOUNTER — Telehealth: Payer: Self-pay | Admitting: Neurology

## 2017-03-16 LAB — BASIC METABOLIC PANEL
BUN/Creatinine Ratio: 13 (ref 12–28)
BUN: 12 mg/dL (ref 8–27)
CO2: 28 mmol/L (ref 20–29)
CREATININE: 0.89 mg/dL (ref 0.57–1.00)
Calcium: 9.4 mg/dL (ref 8.7–10.3)
Chloride: 101 mmol/L (ref 96–106)
GFR, EST AFRICAN AMERICAN: 72 mL/min/{1.73_m2} (ref >59)
GFR, EST NON AFRICAN AMERICAN: 62 mL/min/{1.73_m2} (ref >59)
Glucose: 100 mg/dL — ABNORMAL HIGH (ref 65–99)
POTASSIUM: 4.6 mmol/L (ref 3.5–5.2)
SODIUM: 144 mmol/L (ref 134–144)

## 2017-03-16 NOTE — Telephone Encounter (Signed)
Patient called regarding her prescription that was called in for her at her last appointment. She uses [facility] on [address]. She was told they need some clarification before it can be filled. Please Call. Thanks

## 2017-03-16 NOTE — Telephone Encounter (Signed)
Working on PA

## 2017-03-16 NOTE — Telephone Encounter (Signed)
Attempted to contact pt. No answer, no option to leave a message due to her voicemail being full. Will try back.  

## 2017-03-17 ENCOUNTER — Telehealth: Payer: Self-pay | Admitting: *Deleted

## 2017-03-17 DIAGNOSIS — Z79899 Other long term (current) drug therapy: Secondary | ICD-10-CM

## 2017-03-17 MED ORDER — LISINOPRIL 2.5 MG PO TABS: 2.5000 mg | ORAL_TABLET | Freq: Every day | ORAL | 1 refills | Status: DC

## 2017-03-17 NOTE — Telephone Encounter (Signed)
-----   Message from [place], [place] sent at 03/16/2017  8:37 AM EST ----- Kidney function and electrolytes are normal.  Continue lasix 40mg  qd. Stop supplemental potassium. Add lisinopril 2.5mg  qd.    Follow up in hypertension clinic in 2 weeks with BMET. She needs to bring all medications and BP machine.

## 2017-03-17 NOTE — Telephone Encounter (Signed)
Attempted to contact pt. No answer, no option to leave a message due to her voicemail being full. Will try back.  

## 2017-03-17 NOTE — Telephone Encounter (Signed)
Nortriptyline and promethazine require PA, initiated on cover my meds Nortriptyline VWP:[id number] Promethazine key:[id number]

## 2017-03-18 NOTE — Telephone Encounter (Signed)
We have attempted to contact the pt several times with no success or call back from the pt. Per triage protocol, message will be closed.  

## 2017-03-21 NOTE — Telephone Encounter (Signed)
Both were denied, will start appeal

## 2017-03-22 ENCOUNTER — Other Ambulatory Visit: Payer: Self-pay | Admitting: Emergency Medicine

## 2017-03-22 MED ORDER — TRAZODONE HCL 50 MG PO TABS: 50.0000 mg | ORAL_TABLET | Freq: Every evening | ORAL | 0 refills | Status: DC | PRN

## 2017-03-29 ENCOUNTER — Other Ambulatory Visit: Payer: Self-pay | Admitting: *Deleted

## 2017-03-29 DIAGNOSIS — G4733 Obstructive sleep apnea (adult) (pediatric): Secondary | ICD-10-CM

## 2017-03-30 DIAGNOSIS — G4733 Obstructive sleep apnea (adult) (pediatric): Secondary | ICD-10-CM | POA: Diagnosis not present

## 2017-03-31 ENCOUNTER — Other Ambulatory Visit: Payer: Medicare Other | Admitting: *Deleted

## 2017-03-31 ENCOUNTER — Ambulatory Visit (INDEPENDENT_AMBULATORY_CARE_PROVIDER_SITE_OTHER): Payer: Medicare Other | Admitting: Pharmacist

## 2017-03-31 VITALS — BP 146/78

## 2017-03-31 DIAGNOSIS — I10 Essential (primary) hypertension: Secondary | ICD-10-CM

## 2017-03-31 DIAGNOSIS — Z79899 Other long term (current) drug therapy: Secondary | ICD-10-CM

## 2017-03-31 MED ORDER — LISINOPRIL 5 MG PO TABS: 5.0000 mg | ORAL_TABLET | Freq: Every day | ORAL | 1 refills | Status: DC

## 2017-03-31 NOTE — Progress Notes (Signed)
Patient ID: [name]                 DOB: Jan 05, 1941                      MRN: [id number]     HPI: [name] is a 77 y.o. female patient of Dr. Acie Fredrickson who presents today for hypertension evaluation, referred by Robbie Lis, PA. PMH significant for CAD s/p CABG, HTN, HLD (statin intolerance, she is followed in lipid clinic), DM, COPD-Asthma overlap syndrome, former smoker (She smoked 1/2 ppd for about 35 years, quit at age 37) GERD, OSA and chronic diastolic CHF. She has previously been seen by me for lipid management. She was started on Repatha which she did not tolerate due to muscle aching and the medication was discontinued. At her most recent OV she was started on lisinopril 2.48m daily due to elevated pressures.   She presents today for further BP management. She reports she has been using more albuterol recently. She states she has not noticed any improvement in her BP with the 2.572mof lisinopril. She has been tolerating it well though. She denies new or different chest pain, SOB, and dizziness. She does have questions about which medications are influencing her pressures and these have been answered today.   Current HTN meds:  Diltiazem 24096maily in the morning Furosemide 4m60mD Imdur 90mg59mly  Lisinopril 2.5mg d24my around lunch time Metoprolol succinate 50mg d69m in the evening  Previously tried: amlodipine (hallucinations)  BP goal: <130/80  Diet: She tries to eat healthy as much as possible. Eats lean proteins, avoids red meats. Eats lots of greens. Very cautious about carbohydrates because she is a diabetic. Doesn't drink sodas. Drinks juice occasionally.   Exercise: Will be starting to walk 3x a week.   Family History: The patient's family history includes Diabetes in her maternal grandfather and mother; Emphysema in her paternal aunt and paternal uncle; Esophageal cancer (age of onset: 32) in 51r brother; Heart attack in her father; Heart disease in her maternal  grandfather; Heart failure in her maternal grandfather; Rheum arthritis in her sister; Stomach cancer in her father.  Social History: The patient reports that she quit smoking about 40 years ago. Her smoking use included Cigarettes. She started smoking about 61 years ago. She has a 10.50 pack-year smoking history. She has never used smokeless tobacco. She reports that she does not drink alcohol or use drugs.  Home BP readings: 177/78,366/44ys034ic is the lowest BP she has seen  Wt Readings from Last 3 Encounters:  03/15/17 202 lb 12.8 oz (92 kg)  03/10/17 196 lb (88.9 kg)  03/09/17 199 lb 6 oz (90.4 kg)   BP Readings from Last 3 Encounters:  03/31/17 (!) 146/78  03/15/17 140/86  03/10/17 134/80   Pulse Readings from Last 3 Encounters:  03/15/17 86  03/10/17 78  01/25/17 (!) 110    Renal function: CrCl cannot be calculated (Unknown ideal weight.).  Past Medical History:  Diagnosis Date  . Adenomatous colon polyp   . Allergy   . Anxiety   . Asthma   . Chest pain   . Chronic diastolic CHF (congestive heart failure) (HCC)   [place] (chronic obstructive pulmonary disease) (HCC)   [place] is unsure if has been officially diagnosed  . Coronary artery disease    CABG '09- cathed 12/09, 9/10, 6/11, 3/14 and 12/13/16- medical Rx  . Diabetes mellitus   .  GERD (gastroesophageal reflux disease)   . Hiatal hernia   . Hyperlipidemia   . Hypertension   . Myocardial infarction ([place]) 2009  . PONV (postoperative nausea and vomiting)   . Schatzki's ring   . Shoulder injury    resolved after shoulder surgery  . Sleep apnea    not on cpap    Current Outpatient Medications on File Prior to Visit  Medication Sig Dispense Refill  . albuterol (PROVENTIL HFA;VENTOLIN HFA) 108 (90 BASE) MCG/ACT inhaler Inhale 2 puffs into the lungs every 6 (six) hours as needed. For shortness of breath. 1 Inhaler 0  . alum & mag hydroxide-simeth (MAALOX/MYLANTA) 200-200-20 MG/5ML suspension Take 30 mLs as  needed by mouth for indigestion or heartburn.    Marland Kitchen aspirin EC 81 MG tablet Take 1 tablet (81 mg total) by mouth daily.    . baclofen (LIORESAL) 20 MG tablet Take 20 mg 2 (two) times daily by mouth.    . benzonatate (TESSALON) 100 MG capsule Take 100 mg by mouth 2 (two) times daily as needed for cough.     . Blood Glucose Monitoring Suppl (ONE TOUCH ULTRA MINI) w/Device KIT 3 (three) times daily. for testing  0  . budesonide-formoterol (SYMBICORT) 160-4.5 MCG/ACT inhaler Inhale 2 puffs into the lungs 2 (two) times daily.    . calcium carbonate (TUMS - DOSED IN MG ELEMENTAL CALCIUM) 500 MG chewable tablet Chew 2 tablets daily as needed by mouth for indigestion or heartburn.    . Cholecalciferol (VITAMIN D) 2000 UNITS tablet Take 2,000 Units by mouth daily.     . clonazePAM (KLONOPIN) 0.5 MG tablet Take 0.5 mg daily by mouth.    . clopidogrel (PLAVIX) 75 MG tablet Take 1 tablet (75 mg total) by mouth daily. 90 tablet 3  . Cyanocobalamin (B-12 COMPLIANCE INJECTION) 1000 MCG/ML KIT Inject 1,000 mcg every 30 (thirty) days as directed.    . cyclobenzaprine (FLEXERIL) 5 MG tablet TAKE 1 TABLET BY MOUTH AT BEDTIME 30 tablet 0  . diclofenac sodium (VOLTAREN) 1 % GEL Apply 2 g topically 4 (four) times daily as needed (muscle pain).    Marland Kitchen diltiazem (CARDIZEM CD) 240 MG 24 hr capsule Take 1 capsule (240 mg total) by mouth daily. 30 capsule 10  . fexofenadine (ALLEGRA) 180 MG tablet Take 1 tablet (180 mg total) by mouth daily. 30 tablet 6  . Fluticasone-Umeclidin-Vilant (TRELEGY ELLIPTA) 100-62.5-25 MCG/INH AEPB Inhale 1 puff into the lungs daily. 1 each 0  . furosemide (LASIX) 40 MG tablet Take 1 tablet (40 mg total) by mouth daily. (Patient taking differently: Take 40 mg by mouth 2 (two) times daily. ) 90 tablet 3  . insulin lispro protamine-insulin lispro (HUMALOG 75/25) (75-25) 100 UNIT/ML SUSP Inject 20-30 Units into the skin See admin instructions. Takes 20 units in the morning and 30  units with supper      . isosorbide mononitrate (IMDUR) 60 MG 24 hr tablet Take 1.5 tablets (90 mg total) by mouth daily. 135 tablet 3  . loperamide (IMODIUM) 2 MG capsule Take 2 mg by mouth as needed for diarrhea or loose stools.     . meclizine (ANTIVERT) 25 MG tablet Take 25 mg by mouth 2 (two) times daily as needed for dizziness.     . methocarbamol (ROBAXIN) 500 MG tablet Take 500 mg 2 (two) times daily by mouth.    . metoprolol succinate (TOPROL-XL) 50 MG 24 hr tablet Take 1 tablet (50 mg total) by mouth daily. 30 tablet 10  .  montelukast (SINGULAIR) 5 MG chewable tablet Chew 1 tablet (5 mg total) by mouth at bedtime. 30 tablet 3  . Multiple Vitamin (MULTIVITAMIN WITH MINERALS) TABS Take 1 tablet by mouth daily.    . Nebulizers (COMPRESSOR/NEBULIZER) MISC Use with albuterol 1 each 0  . nitroGLYCERIN (NITROSTAT) 0.4 MG SL tablet Place 0.4 mg under the tongue every 5 (five) minutes as needed for chest pain.    . nortriptyline (PAMELOR) 10 MG capsule Take 1 capsule (10 mg total) by mouth at bedtime. 30 capsule 2  . ONE TOUCH ULTRA TEST test strip CHECK BLOOD SUGAR TWICE A DAY DX: E11.9  3  . ONETOUCH DELICA LANCETS 24E MISC 3 (three) times daily. for testing  0  . Pancrelipase, Lip-Prot-Amyl, 24000 units CPEP Take 2 capsules (48,000 Units total) by mouth 3 (three) times daily with meals. Take 1 capsule with a snack 210 capsule 6  . pantoprazole (PROTONIX) 40 MG tablet Take 1 tablet (40 mg total) by mouth 2 (two) times daily before a meal. 180 tablet 3  . Spacer/Aero-Holding Chambers (AEROCHAMBER MV) inhaler Use as instructed 1 each 0  . traZODone (DESYREL) 50 MG tablet Take 1-2 tablets (50-100 mg total) by mouth at bedtime as needed for sleep. 180 tablet 0   Current Facility-Administered Medications on File Prior to Visit  Medication Dose Route Frequency Provider Last Rate Last Dose  . cyanocobalamin ((VITAMIN B-12)) injection 1,000 mcg  1,000 mcg Intramuscular Q30 days Binnie Rail, MD   1,000 mcg at 03/26/16  1658    Allergies  Allergen Reactions  . Amlodipine Other (See Comments)    hallucinations   . Banana Nausea And Vomiting    Stomach pumped  . Co Q10 [Coenzyme Q10]     Body cramps  . Codeine     Hallucinate, loose identity and don't know who I am  . Morphine Other (See Comments)    Can not function, it immobilizes me   . Pentazocine   . Pravastatin     Hands locked up  . Repatha [Evolocumab]     myalgias  . Statins     Muscle cramps  . Sulfa Antibiotics   . Sulfonamide Derivatives Swelling  . Tramadol Hcl     Blood pressure (!) 146/78.   Assessment/Plan: Hypertension: BMET today. BP is not at goal today and is above goal at home per patient report. Will increase lisinopril to 7m daily. Continue to monitor and follow up in 3 weeks for repeat labs and bp check.    Thank you, KLelan Pons APatterson Hammersmith [facility]  03/31/2017 4:47 PM

## 2017-03-31 NOTE — Patient Instructions (Signed)
Return for a follow up appointment in 3-4 weeks  Your blood pressure goal is less than 130/80  Check your blood pressure at home daily (if able) and keep record of the readings.  Take your BP meds as follows: INCREASE lisinopril 5mg  daily (you may take 2 tablets of your current supply until you run out then pick up higher strength at the pharmacy and take 1 tablet daily)  CONTINUE all other medications as prescribed   Bring all of your meds, your BP cuff and your record of home blood pressures to your next appointment.  Exercise as you're able, try to walk approximately 30 minutes per day.  Keep salt intake to a minimum, especially watch canned and prepared boxed foods.  Eat more fresh fruits and vegetables and fewer canned items.  Avoid eating in fast food restaurants.    HOW TO TAKE YOUR BLOOD PRESSURE: . Rest 5 minutes before taking your blood pressure. .  Don't smoke or drink caffeinated beverages for at least 30 minutes before. . Take your blood pressure before (not after) you eat. . Sit comfortably with your back supported and both feet on the floor (don't cross your legs). . Elevate your arm to heart level on a table or a desk. . Use the proper sized cuff. It should fit smoothly and snugly around your bare upper arm. There should be enough room to slip a fingertip under the cuff. The bottom edge of the cuff should be 1 inch above the crease of the elbow. . Ideally, take 3 measurements at one sitting and record the average.

## 2017-04-01 LAB — BASIC METABOLIC PANEL
BUN/Creatinine Ratio: 15 (ref 12–28)
BUN: 11 mg/dL (ref 8–27)
CALCIUM: 9.5 mg/dL (ref 8.7–10.3)
CHLORIDE: 104 mmol/L (ref 96–106)
CO2: 28 mmol/L (ref 20–29)
Creatinine, Ser: 0.74 mg/dL (ref 0.57–1.00)
GFR calc Af Amer: 90 mL/min/{1.73_m2} (ref >59)
GFR calc non Af Amer: 78 mL/min/{1.73_m2} (ref >59)
Glucose: 59 mg/dL — ABNORMAL LOW (ref 65–99)
POTASSIUM: 4.3 mmol/L (ref 3.5–5.2)
Sodium: 145 mmol/L — ABNORMAL HIGH (ref 134–144)

## 2017-04-01 NOTE — Progress Notes (Signed)
Pt has been made aware of normal result and verbalized understanding.  jw 04/01/17

## 2017-04-03 ENCOUNTER — Other Ambulatory Visit: Payer: Self-pay | Admitting: Internal Medicine

## 2017-04-04 ENCOUNTER — Other Ambulatory Visit: Payer: Self-pay

## 2017-04-04 DIAGNOSIS — G4734 Idiopathic sleep related nonobstructive alveolar hypoventilation: Secondary | ICD-10-CM

## 2017-04-18 ENCOUNTER — Ambulatory Visit: Payer: Medicare Other | Admitting: Pulmonary Disease

## 2017-04-21 ENCOUNTER — Ambulatory Visit: Payer: Medicare Other | Admitting: Acute Care

## 2017-04-21 ENCOUNTER — Encounter: Payer: Self-pay | Admitting: Pharmacist

## 2017-04-21 ENCOUNTER — Ambulatory Visit (INDEPENDENT_AMBULATORY_CARE_PROVIDER_SITE_OTHER): Payer: Medicare Other | Admitting: Pharmacist

## 2017-04-21 DIAGNOSIS — I10 Essential (primary) hypertension: Secondary | ICD-10-CM

## 2017-04-21 MED ORDER — LISINOPRIL 20 MG PO TABS: 20.0000 mg | ORAL_TABLET | Freq: Every day | ORAL | 1 refills | Status: DC

## 2017-04-21 NOTE — Progress Notes (Signed)
Patient ID: VIANA SLEEP                 DOB: 1940/07/31                      MRN: [id number]     HPI: [name] is a 77 y.o. female patient of Dr. Acie Fredrickson who presents today for hypertension evaluation, referred by Robbie Lis, PA. PMH significant for CAD s/p CABG, HTN, HLD (statin intolerance, she is followed in lipid clinic), DM, COPD-Asthma overlap syndrome, former smoker (She smoked 1/2 ppd for about 35 years, quit at age 68) GERD, OSA and chronic diastolic CHF. She has previously been seen by me for lipid management. She was started on Repatha which she did not tolerate due to muscle aching and the medication was discontinued. At her most recent OV her lisinopril was increased to 54m daily.   She presents today for further BP management. She reports she has been taking two 542mtablets of the lisinopril since our last visit. One night with really high pressures that scared her. She believes that the top has improved, but the bottom has stayed the same. Headaches are lasting longer. She reports that balance is off when pressures are high. She states that her chest pain is better with the higher dose of imdur, but she believes that this is contributing to her headaches. She has noticed that she takes the Imdur at dinner and by bedtime she is feeling poorly due to headache.   She is in a lot of pain with her broken foot.   Current HTN meds:  Diltiazem 24065maily in the morning Furosemide 73m58mD Imdur 90mg63mly at dinner time Lisinopril 10mg 64my around lunch time Metoprolol succinate 50mg d43m in the evening  Previously tried: amlodipine (hallucinations)  BP goal: <130/80  Diet: She tries to eat healthy as much as possible. Eats lean proteins, avoids red meats. Eats lots of greens. Very cautious about carbohydrates because she is a diabetic. Doesn't drink sodas. Drinks juice occasionally.   Exercise: Will be starting to walk 3x a week.   Family History: The patient's family  history includes Diabetes in her maternal grandfather and mother; Emphysema in her paternal aunt and paternal uncle; Esophageal cancer (age of onset: 32) in 46r brother; Heart attack in her father; Heart disease in her maternal grandfather; Heart failure in her maternal grandfather; Rheum arthritis in her sister; Stomach cancer in her father.  Social History: The patient reports that she quit smoking about 40 years ago. Her smoking use included Cigarettes. She started smoking about 61 years ago. She has a 10.50 pack-year smoking history. She has never used smokeless tobacco. She reports that she does not drink alcohol or use drugs.  Home BP readings: 130s-160s/80s-100s mostly 150s/90s  Wt Readings from Last 3 Encounters:  03/15/17 202 lb 12.8 oz (92 kg)  03/10/17 196 lb (88.9 kg)  03/09/17 199 lb 6 oz (90.4 kg)   BP Readings from Last 3 Encounters:  04/21/17 (!) 148/78  03/31/17 (!) 146/78  03/15/17 140/86   Pulse Readings from Last 3 Encounters:  04/21/17 70  03/15/17 86  03/10/17 78    Renal function: CrCl cannot be calculated (Unknown ideal weight.).  Past Medical History:  Diagnosis Date  . Adenomatous colon polyp   . Allergy   . Anxiety   . Asthma   . Chest pain   . Chronic diastolic CHF (congestive heart failure) (HCC)   [place]  COPD (chronic obstructive pulmonary disease) ([place])    pt is unsure if has been officially diagnosed  . Coronary artery disease    CABG '09- cathed 12/09, 9/10, 6/11, 3/14 and 12/13/16- medical Rx  . Diabetes mellitus   . GERD (gastroesophageal reflux disease)   . Hiatal hernia   . Hyperlipidemia   . Hypertension   . Myocardial infarction ([place]) 2009  . PONV (postoperative nausea and vomiting)   . Schatzki's ring   . Shoulder injury    resolved after shoulder surgery  . Sleep apnea    not on cpap    Current Outpatient Medications on File Prior to Visit  Medication Sig Dispense Refill  . albuterol (PROVENTIL HFA;VENTOLIN HFA) 108 (90  BASE) MCG/ACT inhaler Inhale 2 puffs into the lungs every 6 (six) hours as needed. For shortness of breath. 1 Inhaler 0  . alum & mag hydroxide-simeth (MAALOX/MYLANTA) 200-200-20 MG/5ML suspension Take 30 mLs as needed by mouth for indigestion or heartburn.    Marland Kitchen aspirin EC 81 MG tablet Take 1 tablet (81 mg total) by mouth daily.    . baclofen (LIORESAL) 20 MG tablet Take 20 mg 2 (two) times daily by mouth.    . benzonatate (TESSALON) 100 MG capsule Take 100 mg by mouth 2 (two) times daily as needed for cough.     . Blood Glucose Monitoring Suppl (ONE TOUCH ULTRA MINI) w/Device KIT 3 (three) times daily. for testing  0  . budesonide-formoterol (SYMBICORT) 160-4.5 MCG/ACT inhaler Inhale 2 puffs into the lungs 2 (two) times daily.    . calcium carbonate (TUMS - DOSED IN MG ELEMENTAL CALCIUM) 500 MG chewable tablet Chew 2 tablets daily as needed by mouth for indigestion or heartburn.    . Cholecalciferol (VITAMIN D) 2000 UNITS tablet Take 2,000 Units by mouth daily.     . clonazePAM (KLONOPIN) 0.5 MG tablet Take 0.5 mg daily by mouth.    . clopidogrel (PLAVIX) 75 MG tablet Take 1 tablet (75 mg total) by mouth daily. 90 tablet 3  . Cyanocobalamin (B-12 COMPLIANCE INJECTION) 1000 MCG/ML KIT Inject 1,000 mcg every 30 (thirty) days as directed.    . cyclobenzaprine (FLEXERIL) 5 MG tablet TAKE 1 TABLET BY MOUTH AT BEDTIME 30 tablet 0  . diclofenac sodium (VOLTAREN) 1 % GEL Apply 2 g topically 4 (four) times daily as needed (muscle pain).    Marland Kitchen diltiazem (CARDIZEM CD) 240 MG 24 hr capsule Take 1 capsule (240 mg total) by mouth daily. 30 capsule 10  . fexofenadine (ALLEGRA) 180 MG tablet Take 1 tablet (180 mg total) by mouth daily. 30 tablet 6  . Fluticasone-Umeclidin-Vilant (TRELEGY ELLIPTA) 100-62.5-25 MCG/INH AEPB Inhale 1 puff into the lungs daily. 1 each 0  . furosemide (LASIX) 40 MG tablet Take 1 tablet (40 mg total) by mouth daily. (Patient taking differently: Take 40 mg by mouth 2 (two) times daily. )  90 tablet 3  . insulin lispro protamine-insulin lispro (HUMALOG 75/25) (75-25) 100 UNIT/ML SUSP Inject 20-30 Units into the skin See admin instructions. Takes 20 units in the morning and 30  units with supper    . isosorbide mononitrate (IMDUR) 60 MG 24 hr tablet Take 1.5 tablets (90 mg total) by mouth daily. 135 tablet 3  . loperamide (IMODIUM) 2 MG capsule Take 2 mg by mouth as needed for diarrhea or loose stools.     . meclizine (ANTIVERT) 25 MG tablet Take 25 mg by mouth 2 (two) times daily as needed for dizziness.     Marland Kitchen  methocarbamol (ROBAXIN) 500 MG tablet Take 500 mg 2 (two) times daily by mouth.    . metoprolol succinate (TOPROL-XL) 50 MG 24 hr tablet Take 1 tablet (50 mg total) by mouth daily. 30 tablet 10  . montelukast (SINGULAIR) 5 MG chewable tablet Chew 1 tablet (5 mg total) by mouth at bedtime. 30 tablet 3  . Multiple Vitamin (MULTIVITAMIN WITH MINERALS) TABS Take 1 tablet by mouth daily.    . Nebulizers (COMPRESSOR/NEBULIZER) MISC Use with albuterol 1 each 0  . nitroGLYCERIN (NITROSTAT) 0.4 MG SL tablet Place 0.4 mg under the tongue every 5 (five) minutes as needed for chest pain.    . nortriptyline (PAMELOR) 10 MG capsule Take 1 capsule (10 mg total) by mouth at bedtime. 30 capsule 2  . ONE TOUCH ULTRA TEST test strip CHECK BLOOD SUGAR TWICE A DAY DX: E11.9  3  . ONETOUCH DELICA LANCETS 86V MISC 3 (three) times daily. for testing  0  . Pancrelipase, Lip-Prot-Amyl, 24000 units CPEP Take 2 capsules (48,000 Units total) by mouth 3 (three) times daily with meals. Take 1 capsule with a snack 210 capsule 6  . pantoprazole (PROTONIX) 40 MG tablet Take 1 tablet (40 mg total) by mouth 2 (two) times daily before a meal. 180 tablet 3  . Spacer/Aero-Holding Chambers (AEROCHAMBER MV) inhaler Use as instructed 1 each 0  . traZODone (DESYREL) 50 MG tablet Take 1-2 tablets (50-100 mg total) by mouth at bedtime as needed for sleep. 180 tablet 0   Current Facility-Administered Medications on File  Prior to Visit  Medication Dose Route Frequency Provider Last Rate Last Dose  . cyanocobalamin ((VITAMIN B-12)) injection 1,000 mcg  1,000 mcg Intramuscular Q30 days Binnie Rail, MD   1,000 mcg at 03/26/16 1658    Allergies  Allergen Reactions  . Amlodipine Other (See Comments)    hallucinations   . Banana Nausea And Vomiting    Stomach pumped  . Co Q10 [Coenzyme Q10]     Body cramps  . Codeine     Hallucinate, loose identity and don't know who I am  . Morphine Other (See Comments)    Can not function, it immobilizes me   . Pentazocine   . Pravastatin     Hands locked up  . Repatha [Evolocumab]     myalgias  . Statins     Muscle cramps  . Sulfa Antibiotics   . Sulfonamide Derivatives Swelling  . Tramadol Hcl     Blood pressure (!) 148/78, pulse 70.   Assessment/Plan: Hypertension: BMET today. BP is above goal today. Will increase lisinopril to 29m daily. Follow up in HTN clinic in 3-4 weeks.  Will route complaints with Imdur to Dr. NAcie Fredricksonto address.   Thank you, KLelan Pons APatterson Hamm[place]mith [place] 04/21/2017 3:32 PM  ADDENDUM: BMET WNL. Continue therapy as above.

## 2017-04-21 NOTE — Progress Notes (Deleted)
History of Present Illness [name] is a 77 y.o. female former smoker ( Quit 75)  with COPD-Asthma overlap syndrome, GERD, OSA. She is followed by Dr. Lake Bells.  Synopsis: Former patient of Dr. Ashok Cordia with COPD-Asthma overlap syndrome, GERD, OSA; She is a retired Education officer, museum from [employer]. She smoked 1/2 ppd for about 35 years, quit at age 49.  She like to avoid steroids when she has flares of her disease.  She will take them if she has to though.  Maintenance: Symbicort ( once daily ) Albuterol 3 times daily.  04/21/2017   Pt. Presents today for follow up. She was seen by Dr. Lake Bells 03/10/2017. 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 She was started on oxygen at 2 L [place] with sleep.     Pt. Presents for follow up. She states she has been compliant with the Trelegy.   Test Results:  PFT 12/23/16: FVC 1.44 L (72%) FEV1 0.96 L (63%) FEV1/FVC 0.67 FEF 25-75 0.55 L (42%) negative bronchodilator response TLC 3.85 L (78%) RV 102% DLCO uncorrected 45% 06/04/15: FVC 1.57 L (77%) FEV1 1.01 L (64%) FEV1/FVC 0.65 FEF 25-75 0.54 L (39%) negative bronchodilator response 02/21/15: FVC 1.60 L  (78%) FEV1 1.03 L (65%) FEV1/FVC 0.64 FEF 25-75 0.51 L (37%) positive bronchodilator response 11/20/14: FVC 1.44 L (70%) FEV1 0.91 L (57%) FEV1/FVC 0.63 FEF 25-75 0.47 L (33%) positive bronchodilator response TLC 3.92 L (79%) RV 103% DLCO uncorrected 64% 11/12/13: FVC 1.57 L (74%) FEV1 1.09 L (67%) FEV1/FVC 0.70 FEF 25-75 0.72 L (50%) negative bronchodilator response 08/04/11: FVC 1.61 L (60%) FEV1 0.89 L (48%) FEV1/FVC 0.56 FEF 25-75 0.29 L (14%) positive bronchodilator response TLC 3.67 L (80%) RV 103% ERV 46% DLCO corrected 38%  6MWT 11/20/14:  Walked 336 meters / Baseline Sat 100% on RA / Nadir Sat 99% on RA (pt c/o pain in her right neck w/ dyspnea)  IMAGING November 2018 CT angiogram chest images independently reviewed showing centrilobular predominant emphysema, mild in an upper lobe distribution, no PE, there is a 5 mm right upper lobe nodule.  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 LIMA to LAD is patent.  Prox RCA lesion is 25% stenosed. SVG to PDA is occluded.  Mid RCA lesion is 25% stenosed.  Circumflex is a large tortuous vessel.  Ost 1st Diag lesion is 100% stenosed. SVG to diagonal is patent.  The left ventricular systolic function is normal.  LV end diastolic pressure is normal.  The left ventricular ejection fraction is 55-65% by visual estimate.  There is no aortic valve stenosis.  TTE (10/06/11):  LV normal in size. Normal regional wall motion. EF 60-65%. Grade 1 diastolic dysfunction. LA & RA normal in size. 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 RV normal in size and function. RVSP 19 mmHg. No aortic stenosis or regurgitation. No mitral stenosis or regurgitation. No pulmonic stenosis. Trivial tricuspid regurgitation. No pericardial effusion.  LABS 03/13/15 Procalcitonin:  <0.1 CBC: 10.9/11.8/37.3/245  10/08/14 IgG: 1060 IgM: 112 IgA: 272 IgE: 5 CBC: 6.1/11.3/36.0/293 Differential: Eos 0.3  (4.5%)  RAST panel: Negative Aspergillus antigen: <0.1  1/26/9 ABG:  7.39/44/113  Hospitalization records from November 2018 reviewed where she was hospitalized for chest pain  CBC Latest Ref Rng & Units 01/04/2017 12/10/2016 06/21/2016  WBC 4.0 - 10.5 K/uL 6.1 6.1 6.6  Hemoglobin 12.0 - 15.0 g/dL 9.3(L) 10.6(L) 10.9(L)  Hematocrit 36.0 - 46.0 % 31.3(L) 34.1 35.5  Platelets 150 - 400 K/uL 271 270 293    BMP Latest Ref Rng & Units 03/31/2017 03/15/2017 01/04/2017  Glucose 65 - 99 mg/dL 59(L) 100(H) 85  BUN 8 - 27 mg/dL '11 12 11  ' Creatinine 0.57 - 1.00 mg/dL 0.74 0.89 0.71  BUN/Creat Ratio 12 - '28 15 13 ' -  Sodium 134 - 144 mmol/L 145(H) 144 144  Potassium 3.5 - 5.2 mmol/L 4.3 4.6 3.9  Chloride 96 - 106 mmol/L 104 101 107  CO2 20 - 29 mmol/L '28 28 30  ' Calcium 8.7 - 10.3 mg/dL 9.5 9.4 8.7(L)    BNP    Component Value Date/Time   BNP 315.0 (H) 01/04/2017 1505    ProBNP    Component Value Date/Time   PROBNP 128 06/21/2016 1628   PROBNP 158.0 (H) 04/17/2012 1123    PFT    Component Value Date/Time   FEV1PRE 0.96 12/23/2016 1253   FEV1POST 1.03 12/23/2016 1253   FVCPRE 1.44 12/23/2016 1253   FVCPOST 1.50 12/23/2016 1253   TLC 3.85 12/23/2016 1253   DLCOUNC 10.35 12/23/2016 1253   PREFEV1FVCRT 67 12/23/2016 1253   PSTFEV1FVCRT 68 12/23/2016 1253    No results found.   Past medical hx Past Medical History:  Diagnosis Date  . Adenomatous colon polyp   . Allergy   . Anxiety   . Asthma   . Chest pain   . Chronic diastolic CHF (congestive heart failure) ([place])   . COPD (chronic obstructive pulmonary disease) ([place])    pt is unsure if has been officially diagnosed  . Coronary artery disease    CABG '09- cathed 12/09, 9/10, 6/11, 3/14 and 12/13/16- medical Rx  . Diabetes mellitus   . GERD (gastroesophageal reflux disease)   . Hiatal hernia   . Hyperlipidemia   . Hypertension   . Myocardial infarction ([place]) 2009  . PONV (postoperative nausea and vomiting)   .  Schatzki's ring   . Shoulder injury    resolved after shoulder surgery  . Sleep apnea    not on cpap     Social History   Tobacco Use  . Smoking status: Former Smoker    Packs/day: 0.50    Years: 21.00    Pack years: 10.50    Types: Cigarettes    Start date: 02/09/1955    Last attempt to quit: 02/09/1976    Years since quitting: 41.2  . Smokeless tobacco: Never Used  Substance Use Topics  . Alcohol use: No    Alcohol/week: 0.0 oz  . Drug use: No    Ms.Fidel reports that she quit smoking about 41 years ago. Her smoking use included cigarettes.  She started smoking about 62 years ago. She has a 10.50 pack-year smoking history. she has never used smokeless tobacco. She reports that she does not drink alcohol or use drugs.  Tobacco Cessation: Counseling given: Not Answered   Past surgical hx, Family hx, Social hx all reviewed.  Current Outpatient Medications on File Prior to Visit  Medication Sig  . albuterol (PROVENTIL HFA;VENTOLIN HFA) 108 (90 BASE) MCG/ACT inhaler Inhale 2 puffs into the lungs every 6 (six) hours as needed. For shortness of breath.  Marland Kitchen alum & mag hydroxide-simeth (MAALOX/MYLANTA) 200-200-20 MG/5ML suspension Take 30 mLs as needed by mouth for indigestion or heartburn.  Marland Kitchen aspirin EC 81 MG tablet Take 1 tablet (81 mg total) by mouth daily.  . baclofen (LIORESAL) 20 MG tablet Take 20 mg 2 (two) times daily by mouth.  . benzonatate (TESSALON) 100 MG capsule Take 100 mg by mouth 2 (two) times daily as needed for cough.   . Blood Glucose Monitoring Suppl (ONE TOUCH ULTRA MINI) w/Device KIT 3 (three) times daily. for testing  . budesonide-formoterol (SYMBICORT) 160-4.5 MCG/ACT inhaler Inhale 2 puffs into the lungs 2 (two) times daily.  . calcium carbonate (TUMS - DOSED IN MG ELEMENTAL CALCIUM) 500 MG chewable tablet Chew 2 tablets daily as needed by mouth for indigestion or heartburn.  . Cholecalciferol (VITAMIN D) 2000 UNITS tablet Take 2,000 Units by mouth daily.     . clonazePAM (KLONOPIN) 0.5 MG tablet Take 0.5 mg daily by mouth.  . clopidogrel (PLAVIX) 75 MG tablet Take 1 tablet (75 mg total) by mouth daily.  . Cyanocobalamin (B-12 COMPLIANCE INJECTION) 1000 MCG/ML KIT Inject 1,000 mcg every 30 (thirty) days as directed.  . cyclobenzaprine (FLEXERIL) 5 MG tablet TAKE 1 TABLET BY MOUTH AT BEDTIME  . diclofenac sodium (VOLTAREN) 1 % GEL Apply 2 g topically 4 (four) times daily as needed (muscle pain).  Marland Kitchen diltiazem (CARDIZEM CD) 240 MG 24 hr capsule Take 1 capsule (240 mg total) by mouth daily.  . fexofenadine (ALLEGRA) 180 MG tablet Take 1 tablet (180 mg total) by mouth daily.  . Fluticasone-Umeclidin-Vilant (TRELEGY ELLIPTA) 100-62.5-25 MCG/INH AEPB Inhale 1 puff into the lungs daily.  . furosemide (LASIX) 40 MG tablet Take 1 tablet (40 mg total) by mouth daily. (Patient taking differently: Take 40 mg by mouth 2 (two) times daily. )  . insulin lispro protamine-insulin lispro (HUMALOG 75/25) (75-25) 100 UNIT/ML SUSP Inject 20-30 Units into the skin See admin instructions. Takes 20 units in the morning and 30  units with supper  . isosorbide mononitrate (IMDUR) 60 MG 24 hr tablet Take 1.5 tablets (90 mg total) by mouth daily.  Marland Kitchen lisinopril (PRINIVIL,ZESTRIL) 5 MG tablet Take 1 tablet (5 mg total) by mouth daily.  Marland Kitchen loperamide (IMODIUM) 2 MG capsule Take 2 mg by mouth as needed for diarrhea or loose stools.   . meclizine (ANTIVERT) 25 MG tablet Take 25 mg by mouth 2 (two) times daily as needed for dizziness.   . methocarbamol (ROBAXIN) 500 MG tablet Take 500 mg 2 (two) times daily by mouth.  . metoprolol succinate (TOPROL-XL) 50 MG 24 hr tablet Take 1 tablet (50 mg total) by mouth daily.  . montelukast (SINGULAIR) 5 MG chewable tablet Chew 1 tablet (5 mg total) by mouth at bedtime.  . Multiple Vitamin (MULTIVITAMIN WITH MINERALS) TABS Take 1 tablet by mouth daily.  . Nebulizers (COMPRESSOR/NEBULIZER) MISC Use with albuterol  . nitroGLYCERIN (NITROSTAT) 0.4  MG SL tablet Place 0.4 mg under the tongue  every 5 (five) minutes as needed for chest pain.  . nortriptyline (PAMELOR) 10 MG capsule Take 1 capsule (10 mg total) by mouth at bedtime.  . ONE TOUCH ULTRA TEST test strip CHECK BLOOD SUGAR TWICE A DAY DX: E11.9  . ONETOUCH DELICA LANCETS 63W MISC 3 (three) times daily. for testing  . Pancrelipase, Lip-Prot-Amyl, 24000 units CPEP Take 2 capsules (48,000 Units total) by mouth 3 (three) times daily with meals. Take 1 capsule with a snack  . pantoprazole (PROTONIX) 40 MG tablet Take 1 tablet (40 mg total) by mouth 2 (two) times daily before a meal.  . Spacer/Aero-Holding Chambers (AEROCHAMBER MV) inhaler Use as instructed  . traZODone (DESYREL) 50 MG tablet Take 1-2 tablets (50-100 mg total) by mouth at bedtime as needed for sleep.   Current Facility-Administered Medications on File Prior to Visit  Medication  . cyanocobalamin ((VITAMIN B-12)) injection 1,000 mcg     Allergies  Allergen Reactions  . Amlodipine Other (See Comments)    hallucinations   . Banana Nausea And Vomiting    Stomach pumped  . Co Q10 [Coenzyme Q10]     Body cramps  . Codeine     Hallucinate, loose identity and don't know who I am  . Morphine Other (See Comments)    Can not function, it immobilizes me   . Pentazocine   . Pravastatin     Hands locked up  . Repatha [Evolocumab]     myalgias  . Statins     Muscle cramps  . Sulfa Antibiotics   . Sulfonamide Derivatives Swelling  . Tramadol Hcl     Review Of Systems:  Constitutional:   No  weight loss, night sweats,  Fevers, chills, fatigue, or  lassitude.  HEENT:   No headaches,  Difficulty swallowing,  Tooth/dental problems, or  Sore throat,                No sneezing, itching, ear ache, nasal congestion, post nasal drip,   CV:  No chest pain,  Orthopnea, PND, swelling in lower extremities, anasarca, dizziness, palpitations, syncope.   GI  No heartburn, indigestion, abdominal pain, nausea, vomiting, diarrhea,  change in bowel habits, loss of appetite, bloody stools.   Resp: No shortness of breath with exertion or at rest.  No excess mucus, no productive cough,  No non-productive cough,  No coughing up of blood.  No change in color of mucus.  No wheezing.  No chest wall deformity  Skin: no rash or lesions.  GU: no dysuria, change in color of urine, no urgency or frequency.  No flank pain, no hematuria   MS:  No joint pain or swelling.  No decreased range of motion.  No back pain.  Psych:  No change in mood or affect. No depression or anxiety.  No memory loss.   Vital Signs LMP  (LMP Unknown)    Physical Exam:  General- No distress,  A&Ox3 ENT: No sinus tenderness, TM clear, pale nasal mucosa, no oral exudate,no post nasal drip, no LAN Cardiac: S1, S2, regular rate and rhythm, no murmur Chest: No wheeze/ rales/ dullness; no accessory muscle use, no nasal flaring, no sternal retractions Abd.: Soft Non-tender Ext: No clubbing cyanosis, edema Neuro:  normal strength Skin: No rashes, warm and dry Psych: normal mood and behavior   Assessment/Plan  No problem-specific Assessment & Plan notes found for this encounter.    Magdalen Spatz, NP 04/21/2017  8:45 AM

## 2017-04-21 NOTE — Patient Instructions (Signed)
BMET today   Increase lisinopril 20mg  daily (take four 5mg  tablets daily then start taking 1 tablet daily of 20mg )

## 2017-04-22 LAB — BASIC METABOLIC PANEL
BUN / CREAT RATIO: 20 (ref 12–28)
BUN: 15 mg/dL (ref 8–27)
CO2: 28 mmol/L (ref 20–29)
CREATININE: 0.76 mg/dL (ref 0.57–1.00)
Calcium: 9 mg/dL (ref 8.7–10.3)
Chloride: 104 mmol/L (ref 96–106)
GFR calc Af Amer: 87 mL/min/{1.73_m2} (ref >59)
GFR, EST NON AFRICAN AMERICAN: 75 mL/min/{1.73_m2} (ref >59)
Glucose: 131 mg/dL — ABNORMAL HIGH (ref 65–99)
Potassium: 4.1 mmol/L (ref 3.5–5.2)
SODIUM: 146 mmol/L — AB (ref 134–144)

## 2017-04-28 ENCOUNTER — Ambulatory Visit: Payer: Medicare Other | Admitting: Acute Care

## 2017-04-28 NOTE — Progress Notes (Deleted)
History of Present Illness [name] is a 77 y.o. female with COPD-Asthma overlap syndrome, GERD, OSA;Marland Kitchen ( Non-compliant with CPAP so insurance had her return device) .She is a former patient of Dr. Ashok Cordia and is now followed by Dr. Lake Bells.  Synopsis: Former patient of Dr. Ashok Cordia with COPD-Asthma overlap syndrome, GERD, OSA; She is a retired Education officer, museum from [employer]. She smoked 1/2 ppd for about 35 years, quit at age 7.  She like to avoid steroids when she has flares of her disease.  She will take them if she has to though.   04/28/2017  Pt. Presents for 1 month follow up: Pt was seen by Dr. Lake Bells 03/10/2017 for COPD/Asthma overlap syndrome. Plan and discussion after that visit was as follows.  Discussion: [name] presents to establish care with Dr. Lake Bells  today for a COPD overlap asthma syndrome.  She has centrilobular emphysema seen on the CT scan of her chest.  She also has bronchomalacia.  She says that her symptoms are worse with the change of weather which fit with an asthma-like phenotype.  However she continues to have some shortness of breath.  In the past she was not compliant with CPAP but she says that she has been having more fatigue lately and she is interested in restarting that.  Further, she has bronchomalacia seen on the CT scan of her chest so I think positive pressure would benefit her greatly.  She also has a pulmonary nodule seen on the CT scan of her chest from November 2018.  Given her history of smoking this needs to be followed closely.   Plan: COPD-asthma overlap syndrome: Stop Symbicort Take the Trelegy sample we gave you 1 puff daily no matter how you feel, call me towards the end of the sample and let me know if you think that it is helpful and we can change her prescription Keep using albuterol as needed for chest tightness wheezing or shortness of breath Stay active try to exercise regularly  Pulmonary nodule: We will repeat a CT  scan of the chest in May 2019  Bronchomalacia: This means that your airways collapse, particularly when you sleep: We will try to get use CPAP again  Obstructive sleep apnea: Given the fatigue you have experienced slightly we will order a home sleep study to requalify you for a CPAP device  Pt. Presents after her sleep study. Sleep Study confirmed no sleep apnea, but nocturnal desaturations. Nocturnal oxygen was ordered 03/2017.Pt. Returns for follow up of nocturnal oxygen and to evaluate Trelegy Therapeutic Trial.    Test Results: Home Sleep Study:  03/29/2017>> AHI= 1.8 per hour>> negative for OSA 32 desaturations with a nadir of 78%, average of 87% 138.7 minutes with sat < 89% recorded  PFT 12/23/16: FVC 1.44 L (72%) FEV1 0.96 L (63%) FEV1/FVC 0.67 FEF 25-75 0.55 L (42%) negative bronchodilator response TLC 3.85 L (78%) RV 102% DLCO uncorrected 45% 06/04/15: FVC 1.57 L (77%) FEV1 1.01 L (64%) FEV1/FVC 0.65 FEF 25-75 0.54 L (39%) negative bronchodilator response 02/21/15: FVC 1.60 L (78%) FEV1 1.03 L (65%) FEV1/FVC 0.64 FEF 25-75 0.51 L (37%) positive bronchodilator response 11/20/14: FVC 1.44 L (70%) FEV1 0.91 L (57%) FEV1/FVC 0.63 FEF 25-75 0.47 L (33%) positive bronchodilator response TLC 3.92 L (79%) RV 103% DLCO uncorrected 64% 11/12/13: FVC 1.57 L (74%) FEV1 1.09 L (67%) FEV1/FVC 0.70 FEF 25-75 0.72 L (50%) negative bronchodilator response 08/04/11: FVC 1.61 L (60%) FEV1 0.89 L (48%) FEV1/FVC 0.56  FEF 25-75 0.29 L (14%) positive bronchodilator response TLC 3.67 L (80%) RV 103% ERV 46% DLCO corrected 38%  6MWT 11/20/14:  Walked 336 meters / Baseline Sat 100% on RA / Nadir Sat 99% on RA (pt c/o pain in her right neck w/ dyspnea)  IMAGING November 2018 CT angiogram chest images independently reviewed showing centrilobular predominant emphysema, mild in an upper lobe distribution, no PE, there is a 5 mm right upper lobe nodule.  Significant bronchomalacia noted  R>L  CARDIAC LHC (12/14/16):  Prox LAD lesion is 80% stenosed. LIMA to LAD is patent.  Prox RCA lesion is 25% stenosed. SVG to PDA is occluded.  Mid RCA lesion is 25% stenosed.  Circumflex is a large tortuous vessel.  Ost 1st Diag lesion is 100% stenosed. SVG to diagonal is patent.  The left ventricular systolic function is normal.  LV end diastolic pressure is normal.  The left ventricular ejection fraction is 55-65% by visual estimate.  There is no aortic valve stenosis.  TTE (10/06/11): LV normal in size. Normal regional wall motion. EF 60-65%. Grade 1 diastolic dysfunction. LA & RA normal in size. 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 RV normal in size and function. RVSP 19 mmHg. No aortic stenosis or regurgitation. No mitral stenosis or regurgitation. No pulmonic stenosis. Trivial tricuspid regurgitation. No pericardial effusion.  LABS 03/13/15 Procalcitonin:  <0.1 CBC: 10.9/11.8/37.3/245  10/08/14 IgG: 1060 IgM: 112 IgA: 272 IgE: 5 CBC: 6.1/11.3/36.0/293 Differential: Eos 0.3 (4.5%)  RAST panel: Negative Aspergillus antigen: <0.1  1/26/9 ABG:  7.39/44/113  Hospitalization records from November 2018 reviewed where she was hospitalized for chest pain    CBC Latest Ref Rng & Units 01/04/2017 12/10/2016 06/21/2016  WBC 4.0 - 10.5 K/uL 6.1 6.1 6.6  Hemoglobin 12.0 - 15.0 g/dL 9.3(L) 10.6(L) 10.9(L)  Hematocrit 36.0 - 46.0 % 31.3(L) 34.1 35.5  Platelets 150 - 400 K/uL 271 270 293    BMP Latest Ref Rng & Units 04/21/2017 03/31/2017 03/15/2017  Glucose 65 - 99 mg/dL 131(H) 59(L) 100(H)  BUN 8 - 27 mg/dL _0 Creatinine 0.57 - 1.00 mg/dL 0.76 0.74 0.89  BUN/Creat Ratio 12 - _1 Sodium 134 - 144 mmol/L 146(H) 145(H) 144  Potassium 3.5 - 5.2 mmol/L 4.1 4.3 4.6  Chloride 96 - 106 mmol/L 104 104 101  CO2 20 - 29 mmol/L _2 Calcium 8.7 - 10.3 mg/dL 9.0 9.5 9.4    BNP    Component Value Date/Time   BNP 315.0 (H) 01/04/2017 1505     ProBNP    Component Value Date/Time   PROBNP 128 06/21/2016 1628   PROBNP 158.0 (H) 04/17/2012 1123    PFT    Component Value Date/Time   FEV1PRE 0.96 12/23/2016 1253   FEV1POST 1.03 12/23/2016 1253   FVCPRE 1.44 12/23/2016 1253   FVCPOST 1.50 12/23/2016 1253   TLC 3.85 12/23/2016 1253   DLCOUNC 10.35 12/23/2016 1253   PREFEV1FVCRT 67 12/23/2016 1253   PSTFEV1FVCRT 68 12/23/2016 1253    No results found.   Past medical hx Past Medical History:  Diagnosis Date  . Adenomatous colon polyp   . Allergy   . Anxiety   . Asthma   . Chest pain   . Chronic diastolic CHF (congestive heart failure) ([place])   . COPD (chronic obstructive pulmonary disease) ([place])    pt is unsure if has been officially diagnosed  . Coronary artery disease    CABG '09-  cathed 12/09, 9/10, 6/11, 3/14 and 12/13/16- medical Rx  . Diabetes mellitus   . GERD (gastroesophageal reflux disease)   . Hiatal hernia   . Hyperlipidemia   . Hypertension   . Myocardial infarction ([place]) 2009  . PONV (postoperative nausea and vomiting)   . Schatzki's ring   . Shoulder injury    resolved after shoulder surgery  . Sleep apnea    not on cpap     Social History   Tobacco Use  . Smoking status: Former Smoker    Packs/day: 0.50    Years: 21.00    Pack years: 10.50    Types: Cigarettes    Start date: 02/09/1955    Last attempt to quit: 02/09/1976    Years since quitting: 41.2  . Smokeless tobacco: Never Used  Substance Use Topics  . Alcohol use: No    Alcohol/week: 0.0 oz  . Drug use: No    Ms.Char reports that she quit smoking about 41 years ago. Her smoking use included cigarettes. She started smoking about 62 years ago. She has a 10.50 pack-year smoking history. She has never used smokeless tobacco. She reports that she does not drink alcohol or use drugs.  Tobacco Cessation: Counseling given: Not Answered   Past surgical hx, Family hx, Social hx all reviewed.  Current Outpatient Medications  on File Prior to Visit  Medication Sig  . albuterol (PROVENTIL HFA;VENTOLIN HFA) 108 (90 BASE) MCG/ACT inhaler Inhale 2 puffs into the lungs every 6 (six) hours as needed. For shortness of breath.  Marland Kitchen alum & mag hydroxide-simeth (MAALOX/MYLANTA) 200-200-20 MG/5ML suspension Take 30 mLs as needed by mouth for indigestion or heartburn.  Marland Kitchen aspirin EC 81 MG tablet Take 1 tablet (81 mg total) by mouth daily.  . baclofen (LIORESAL) 20 MG tablet Take 20 mg 2 (two) times daily by mouth.  . benzonatate (TESSALON) 100 MG capsule Take 100 mg by mouth 2 (two) times daily as needed for cough.   . Blood Glucose Monitoring Suppl (ONE TOUCH ULTRA MINI) w/Device KIT 3 (three) times daily. for testing  . budesonide-formoterol (SYMBICORT) 160-4.5 MCG/ACT inhaler Inhale 2 puffs into the lungs 2 (two) times daily.  . calcium carbonate (TUMS - DOSED IN MG ELEMENTAL CALCIUM) 500 MG chewable tablet Chew 2 tablets daily as needed by mouth for indigestion or heartburn.  . Cholecalciferol (VITAMIN D) 2000 UNITS tablet Take 2,000 Units by mouth daily.   . clonazePAM (KLONOPIN) 0.5 MG tablet Take 0.5 mg daily by mouth.  . clopidogrel (PLAVIX) 75 MG tablet Take 1 tablet (75 mg total) by mouth daily.  . Cyanocobalamin (B-12 COMPLIANCE INJECTION) 1000 MCG/ML KIT Inject 1,000 mcg every 30 (thirty) days as directed.  . cyclobenzaprine (FLEXERIL) 5 MG tablet TAKE 1 TABLET BY MOUTH AT BEDTIME  . diclofenac sodium (VOLTAREN) 1 % GEL Apply 2 g topically 4 (four) times daily as needed (muscle pain).  Marland Kitchen diltiazem (CARDIZEM CD) 240 MG 24 hr capsule Take 1 capsule (240 mg total) by mouth daily.  . fexofenadine (ALLEGRA) 180 MG tablet Take 1 tablet (180 mg total) by mouth daily.  . Fluticasone-Umeclidin-Vilant (TRELEGY ELLIPTA) 100-62.5-25 MCG/INH AEPB Inhale 1 puff into the lungs daily.  . furosemide (LASIX) 40 MG tablet Take 1 tablet (40 mg total) by mouth daily. (Patient taking differently: Take 40 mg by mouth 2 (two) times daily. )  .  insulin lispro protamine-insulin lispro (HUMALOG 75/25) (75-25) 100 UNIT/ML SUSP Inject 20-30 Units into the skin See admin instructions. Takes 20  units in the morning and 30  units with supper  . isosorbide mononitrate (IMDUR) 60 MG 24 hr tablet Take 1.5 tablets (90 mg total) by mouth daily.  Marland Kitchen lisinopril (PRINIVIL,ZESTRIL) 20 MG tablet Take 1 tablet (20 mg total) by mouth daily.  Marland Kitchen loperamide (IMODIUM) 2 MG capsule Take 2 mg by mouth as needed for diarrhea or loose stools.   . meclizine (ANTIVERT) 25 MG tablet Take 25 mg by mouth 2 (two) times daily as needed for dizziness.   . methocarbamol (ROBAXIN) 500 MG tablet Take 500 mg 2 (two) times daily by mouth.  . metoprolol succinate (TOPROL-XL) 50 MG 24 hr tablet Take 1 tablet (50 mg total) by mouth daily.  . montelukast (SINGULAIR) 5 MG chewable tablet Chew 1 tablet (5 mg total) by mouth at bedtime.  . Multiple Vitamin (MULTIVITAMIN WITH MINERALS) TABS Take 1 tablet by mouth daily.  . Nebulizers (COMPRESSOR/NEBULIZER) MISC Use with albuterol  . nitroGLYCERIN (NITROSTAT) 0.4 MG SL tablet Place 0.4 mg under the tongue every 5 (five) minutes as needed for chest pain.  . nortriptyline (PAMELOR) 10 MG capsule Take 1 capsule (10 mg total) by mouth at bedtime.  . ONE TOUCH ULTRA TEST test strip CHECK BLOOD SUGAR TWICE A DAY DX: E11.9  . ONETOUCH DELICA LANCETS 16X MISC 3 (three) times daily. for testing  . Pancrelipase, Lip-Prot-Amyl, 24000 units CPEP Take 2 capsules (48,000 Units total) by mouth 3 (three) times daily with meals. Take 1 capsule with a snack  . pantoprazole (PROTONIX) 40 MG tablet Take 1 tablet (40 mg total) by mouth 2 (two) times daily before a meal.  . Spacer/Aero-Holding Chambers (AEROCHAMBER MV) inhaler Use as instructed  . traZODone (DESYREL) 50 MG tablet Take 1-2 tablets (50-100 mg total) by mouth at bedtime as needed for sleep.   Current Facility-Administered Medications on File Prior to Visit  Medication  . cyanocobalamin  ((VITAMIN B-12)) injection 1,000 mcg     Allergies  Allergen Reactions  . Amlodipine Other (See Comments)    hallucinations   . Banana Nausea And Vomiting    Stomach pumped  . Co Q10 [Coenzyme Q10]     Body cramps  . Codeine     Hallucinate, loose identity and don't know who I am  . Morphine Other (See Comments)    Can not function, it immobilizes me   . Pentazocine   . Pravastatin     Hands locked up  . Repatha [Evolocumab]     myalgias  . Statins     Muscle cramps  . Sulfa Antibiotics   . Sulfonamide Derivatives Swelling  . Tramadol Hcl     Review Of Systems:  Constitutional:   No  weight loss, night sweats,  Fevers, chills, fatigue, or  lassitude.  HEENT:   No headaches,  Difficulty swallowing,  Tooth/dental problems, or  Sore throat,                No sneezing, itching, ear ache, nasal congestion, post nasal drip,   CV:  No chest pain,  Orthopnea, PND, swelling in lower extremities, anasarca, dizziness, palpitations, syncope.   GI  No heartburn, indigestion, abdominal pain, nausea, vomiting, diarrhea, change in bowel habits, loss of appetite, bloody stools.   Resp: No shortness of breath with exertion or at rest.  No excess mucus, no productive cough,  No non-productive cough,  No coughing up of blood.  No change in color of mucus.  No wheezing.  No chest wall deformity  Skin: no rash or lesions.  GU: no dysuria, change in color of urine, no urgency or frequency.  No flank pain, no hematuria   MS:  No joint pain or swelling.  No decreased range of motion.  No back pain.  Psych:  No change in mood or affect. No depression or anxiety.  No memory loss.   Vital Signs LMP  (LMP Unknown)    Physical Exam:  General- No distress,  A&Ox3 ENT: No sinus tenderness, TM clear, pale nasal mucosa, no oral exudate,no post nasal drip, no LAN Cardiac: S1, S2, regular rate and rhythm, no murmur Chest: No wheeze/ rales/ dullness; no accessory muscle use, no nasal flaring, no  sternal retractions Abd.: Soft Non-tender Ext: No clubbing cyanosis, edema Neuro:  normal strength Skin: No rashes, warm and dry Psych: normal mood and behavior   Assessment/Plan  No problem-specific Assessment & Plan notes found for this encounter.    Magdalen Spatz, NP 04/28/2017  8:12 AM

## 2017-05-02 ENCOUNTER — Other Ambulatory Visit: Payer: Self-pay | Admitting: Neurology

## 2017-05-04 ENCOUNTER — Ambulatory Visit (INDEPENDENT_AMBULATORY_CARE_PROVIDER_SITE_OTHER): Payer: Medicare Other | Admitting: Acute Care

## 2017-05-04 ENCOUNTER — Encounter: Payer: Self-pay | Admitting: Acute Care

## 2017-05-04 DIAGNOSIS — J449 Chronic obstructive pulmonary disease, unspecified: Secondary | ICD-10-CM | POA: Diagnosis not present

## 2017-05-04 DIAGNOSIS — K219 Gastro-esophageal reflux disease without esophagitis: Secondary | ICD-10-CM | POA: Diagnosis not present

## 2017-05-04 DIAGNOSIS — G4733 Obstructive sleep apnea (adult) (pediatric): Secondary | ICD-10-CM

## 2017-05-04 DIAGNOSIS — J4489 Other specified chronic obstructive pulmonary disease: Secondary | ICD-10-CM | POA: Insufficient documentation

## 2017-05-04 NOTE — Progress Notes (Signed)
History of Present Illness [name] is a 77 y.o. female former smoker ( Quit 1097) with COPD-Asthma overlap syndrome, GERD,  And nocturnal desaturations ( HS oxygen ordered). She was formerly seen by Dr. Ashok Cordia, but is now seen by Dr. Lake Bells.  Synopsis: Former patient of Dr. 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 She smoked 1/2 ppd for about 35 years, quit at age 20.  She has a pulmonary nodule that needs to be followed as she is a former smoker. >> Scheduled for 06/07/2017  She likes to avoid steroids when she has flares of her disease.  She will take them if she has to though.   05/04/2017 One month follow up: Pt. Presents for one month follow up. She was seen 1/31/ 2019 by Dr. Lake Bells.Plan at the time of that visit was as follows.   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 Called and requested an earlier follow up than May 2019. She is here today stating that she was not started on nocturnal oxygen.She states she does like the Trelegy and wants to continue on that. She is complaining of thick gray secretions worse at night that are causing a cough. She states she is compliant with her Singulair. She has no fever, but  she does have a cough. She is not taking her Zyrtec or her Protonix as prescribes. She denies fever, chest pain, orthopnea or hemoptysis.  Test Results:  Home Sleep study 03/2017>> No Sleep Apnea but nocturnal desaturations. Nocturnal oxygen was ordered 03/2017.  PFT 12/23/16: FVC 1.44 L (72%) FEV1 0.96 L (63%) FEV1/FVC 0.67 FEF 25-75 0.55 L (42%) negative bronchodilator response TLC 3.85 L (78%) RV 102% DLCO uncorrected 45% 06/04/15: FVC 1.57 L (77%) FEV1 1.01 L (64%) FEV1/FVC 0.65 FEF 25-75 0.54 L (39%) negative bronchodilator response 02/21/15: FVC 1.60 L (78%) FEV1 1.03 L (65%) FEV1/FVC 0.64 FEF 25-75 0.51 L (37%) positive bronchodilator response 11/20/14: FVC 1.44 L (70%) FEV1 0.91 L (57%) FEV1/FVC 0.63 FEF 25-75 0.47 L (33%) positive bronchodilator response TLC 3.92 L (79%) RV 103% DLCO uncorrected 64% 11/12/13: FVC 1.57 L (74%) FEV1 1.09 L (67%) FEV1/FVC 0.70 FEF 25-75 0.72 L (50%) negative bronchodilator response 08/04/11: FVC 1.61 L (60%) FEV1 0.89 L (48%) FEV1/FVC 0.56 FEF 25-75 0.29 L (14%) positive bronchodilator response TLC 3.67 L (80%) RV 103% ERV 46% DLCO corrected 38%  6MWT 11/20/14:  Walked 336 meters / Baseline Sat 100% on RA / Nadir Sat 99% on RA (pt c/o pain in her right neck w/ dyspnea)  IMAGING November 2018 CT angiogram chest images independently reviewed showing centrilobular predominant emphysema, mild in an upper lobe distribution, no PE, there is a 5 mm  right upper lobe nodule.  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 LIMA to LAD is patent.  Prox RCA lesion is 25% stenosed. SVG to PDA is occluded.  Mid RCA lesion is 25% stenosed.  Circumflex is a large tortuous vessel.  Ost 1st Diag lesion is 100% stenosed. SVG to diagonal is patent.  The left ventricular systolic function is normal.  LV end diastolic pressure is normal.  The left ventricular ejection fraction is 55-65% by visual  estimate.  There is no aortic valve stenosis.  TTE (10/06/11): LV normal in size. Normal regional wall motion. EF 60-65%. Grade 1 diastolic dysfunction. LA & RA normal in size. 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 RV normal in size and function. RVSP 19 mmHg. No aortic stenosis or regurgitation. No mitral stenosis or regurgitation. No pulmonic stenosis. Trivial tricuspid regurgitation. No pericardial effusion.  LABS 03/13/15 Procalcitonin:  <0.1 CBC: 10.9/11.8/37.3/245  10/08/14 IgG: 1060 IgM: 112 IgA: 272 IgE: 5 CBC: 6.1/11.3/36.0/293 Differential: Eos 0.3 (4.5%)  RAST panel: Negative Aspergillus antigen: <0.1  1/26/9 ABG:  7.39/44/113  Hospitalization records from November 2018 reviewed where she was hospitalized for chest pain   CBC Latest Ref Rng & Units 01/04/2017 12/10/2016 06/21/2016  WBC 4.0 - 10.5 K/uL 6.1 6.1 6.6  Hemoglobin 12.0 - 15.0 g/dL 9.3(L) 10.6(L) 10.9(L)  Hematocrit 36.0 - 46.0 % 31.3(L) 34.1 35.5  Platelets 150 - 400 K/uL 271 270 293    BMP Latest Ref Rng & Units 04/21/2017 03/31/2017 03/15/2017  Glucose 65 - 99 mg/dL 131(H) 59(L) 100(H)  BUN 8 - 27 mg/dL _0 Creatinine 0.57 - 1.00 mg/dL 0.76 0.74 0.89  BUN/Creat Ratio 12 - _1 Sodium 134 - 144 mmol/L 146(H) 145(H) 144  Potassium 3.5 - 5.2 mmol/L 4.1 4.3 4.6  Chloride 96 - 106 mmol/L 104 104 101  CO2 20 - 29 mmol/L _2 Calcium 8.7 - 10.3 mg/dL 9.0 9.5 9.4    BNP    Component Value Date/Time   BNP 315.0 (H) 01/04/2017 1505    ProBNP    Component Value Date/Time   PROBNP 128 06/21/2016 1628   PROBNP 158.0 (H) 04/17/2012 1123    PFT    Component Value Date/Time   FEV1PRE 0.96 12/23/2016 1253   FEV1POST 1.03 12/23/2016 1253   FVCPRE 1.44 12/23/2016 1253   FVCPOST 1.50 12/23/2016 1253   TLC 3.85 12/23/2016 1253   DLCOUNC 10.35 12/23/2016 1253   PREFEV1FVCRT 67 12/23/2016 1253   PSTFEV1FVCRT 68 12/23/2016 1253    No results found.   Past medical  hx Past Medical History:  Diagnosis Date  . Adenomatous colon polyp   . Allergy   . Anxiety   . Asthma   . Chest pain   . Chronic diastolic CHF (congestive heart failure) ([place])   . COPD (chronic obstructive pulmonary disease) ([place])    pt is unsure if has been officially diagnosed  . Coronary artery disease    CABG '09- cathed 12/09, 9/10, 6/11, 3/14 and 12/13/16- medical Rx  . Diabetes mellitus   . GERD (gastroesophageal reflux disease)   . Hiatal hernia   . Hyperlipidemia   . Hypertension   . Myocardial infarction ([place]) 2009  . PONV (postoperative nausea and vomiting)   . Schatzki's ring   . Shoulder injury    resolved after shoulder surgery  . Sleep apnea    not on cpap  Social History   Tobacco Use  . Smoking status: Former Smoker    Packs/day: 0.50    Years: 21.00    Pack years: 10.50    Types: Cigarettes    Start date: 02/09/1955    Last attempt to quit: 02/09/1976    Years since quitting: 41.2  . Smokeless tobacco: Never Used  Substance Use Topics  . Alcohol use: No    Alcohol/week: 0.0 oz  . Drug use: No    Ms.Jasek reports that she quit smoking about 41 years ago. Her smoking use included cigarettes. She started smoking about 62 years ago. She has a 10.50 pack-year smoking history. She has never used smokeless tobacco. She reports that she does not drink alcohol or use drugs.  Tobacco Cessation: Former smoker quit 1978  Past surgical hx, Family hx, Social hx all reviewed.  Current Outpatient Medications on File Prior to Visit  Medication Sig  . albuterol (PROVENTIL HFA;VENTOLIN HFA) 108 (90 BASE) MCG/ACT inhaler Inhale 2 puffs into the lungs every 6 (six) hours as needed. For shortness of breath.  Marland Kitchen alum & mag hydroxide-simeth (MAALOX/MYLANTA) 200-200-20 MG/5ML suspension Take 30 mLs as needed by mouth for indigestion or heartburn.  Marland Kitchen aspirin EC 81 MG tablet Take 1 tablet (81 mg total) by mouth daily.  . baclofen (LIORESAL) 20 MG tablet Take 20 mg 2  (two) times daily by mouth.  . benzonatate (TESSALON) 100 MG capsule Take 100 mg by mouth 2 (two) times daily as needed for cough.   . Blood Glucose Monitoring Suppl (ONE TOUCH ULTRA MINI) w/Device KIT 3 (three) times daily. for testing  . budesonide-formoterol (SYMBICORT) 160-4.5 MCG/ACT inhaler Inhale 2 puffs into the lungs 2 (two) times daily.  . calcium carbonate (TUMS - DOSED IN MG ELEMENTAL CALCIUM) 500 MG chewable tablet Chew 2 tablets daily as needed by mouth for indigestion or heartburn.  . Cholecalciferol (VITAMIN D) 2000 UNITS tablet Take 2,000 Units by mouth daily.   . clonazePAM (KLONOPIN) 0.5 MG tablet Take 0.5 mg daily by mouth.  . clopidogrel (PLAVIX) 75 MG tablet Take 1 tablet (75 mg total) by mouth daily.  . Cyanocobalamin (B-12 COMPLIANCE INJECTION) 1000 MCG/ML KIT Inject 1,000 mcg every 30 (thirty) days as directed.  . cyclobenzaprine (FLEXERIL) 5 MG tablet TAKE 1 TABLET BY MOUTH AT BEDTIME  . diclofenac sodium (VOLTAREN) 1 % GEL Apply 2 g topically 4 (four) times daily as needed (muscle pain).  Marland Kitchen diltiazem (CARDIZEM CD) 240 MG 24 hr capsule Take 1 capsule (240 mg total) by mouth daily.  . fexofenadine (ALLEGRA) 180 MG tablet Take 1 tablet (180 mg total) by mouth daily.  . Fluticasone-Umeclidin-Vilant (TRELEGY ELLIPTA) 100-62.5-25 MCG/INH AEPB Inhale 1 puff into the lungs daily.  . furosemide (LASIX) 40 MG tablet Take 1 tablet (40 mg total) by mouth daily. (Patient taking differently: Take 40 mg by mouth 2 (two) times daily. )  . insulin lispro protamine-insulin lispro (HUMALOG 75/25) (75-25) 100 UNIT/ML SUSP Inject 20-30 Units into the skin See admin instructions. Takes 20 units in the morning and 30  units with supper  . lisinopril (PRINIVIL,ZESTRIL) 20 MG tablet Take 1 tablet (20 mg total) by mouth daily.  Marland Kitchen loperamide (IMODIUM) 2 MG capsule Take 2 mg by mouth as needed for diarrhea or loose stools.   . meclizine (ANTIVERT) 25 MG tablet Take 25 mg by mouth 2 (two) times  daily as needed for dizziness.   . methocarbamol (ROBAXIN) 500 MG tablet Take 500 mg 2 (two)  times daily by mouth.  . metoprolol succinate (TOPROL-XL) 50 MG 24 hr tablet Take 1 tablet (50 mg total) by mouth daily.  . montelukast (SINGULAIR) 5 MG chewable tablet Chew 1 tablet (5 mg total) by mouth at bedtime.  . Multiple Vitamin (MULTIVITAMIN WITH MINERALS) TABS Take 1 tablet by mouth daily.  . Nebulizers (COMPRESSOR/NEBULIZER) MISC Use with albuterol  . nitroGLYCERIN (NITROSTAT) 0.4 MG SL tablet Place 0.4 mg under the tongue every 5 (five) minutes as needed for chest pain.  . nortriptyline (PAMELOR) 10 MG capsule Take 1 capsule (10 mg total) by mouth at bedtime.  . ONE TOUCH ULTRA TEST test strip CHECK BLOOD SUGAR TWICE A DAY DX: E11.9  . ONETOUCH DELICA LANCETS 38S MISC 3 (three) times daily. for testing  . Pancrelipase, Lip-Prot-Amyl, 24000 units CPEP Take 2 capsules (48,000 Units total) by mouth 3 (three) times daily with meals. Take 1 capsule with a snack  . pantoprazole (PROTONIX) 40 MG tablet Take 1 tablet (40 mg total) by mouth 2 (two) times daily before a meal.  . Spacer/Aero-Holding Chambers (AEROCHAMBER MV) inhaler Use as instructed  . traZODone (DESYREL) 50 MG tablet Take 1-2 tablets (50-100 mg total) by mouth at bedtime as needed for sleep.  . isosorbide mononitrate (IMDUR) 60 MG 24 hr tablet Take 1.5 tablets (90 mg total) by mouth daily.   Current Facility-Administered Medications on File Prior to Visit  Medication  . cyanocobalamin ((VITAMIN B-12)) injection 1,000 mcg     Allergies  Allergen Reactions  . Amlodipine Other (See Comments)    hallucinations   . Banana Nausea And Vomiting    Stomach pumped  . Co Q10 [Coenzyme Q10]     Body cramps  . Codeine     Hallucinate, loose identity and don't know who I am  . Morphine Other (See Comments)    Can not function, it immobilizes me   . Pentazocine   . Pravastatin     Hands locked up  . Repatha [Evolocumab]     myalgias   . Statins     Muscle cramps  . Sulfa Antibiotics   . Sulfonamide Derivatives Swelling  . Tramadol Hcl     Review Of Systems:  Constitutional:   No  weight loss, night sweats,  Fevers, chills, fatigue, or  lassitude.  HEENT:   No headaches,  Difficulty swallowing,  Tooth/dental problems, or  Sore throat,                No sneezing, itching, ear ache, nasal congestion, post nasal drip,   CV:  No chest pain,  Orthopnea, PND, swelling in lower extremities, anasarca, dizziness, palpitations, syncope.   GI  No heartburn, indigestion, abdominal pain, nausea, vomiting, diarrhea, change in bowel habits, loss of appetite, bloody stools.   Resp: + shortness of breath with exertion or at rest.  + excess mucus, + productive cough,  No non-productive cough,  No coughing up of blood.  No change in color of mucus.  No wheezing.  No chest wall deformity  Skin: no rash or lesions.  GU: no dysuria, change in color of urine, no urgency or frequency.  No flank pain, no hematuria   MS:  No joint pain or swelling.  No decreased range of motion.  No back pain.  Psych:  No change in mood or affect. No depression or anxiety.  No memory loss.   Vital Signs BP 140/88 (BP Location: Right Arm, Cuff Size: Normal)   Pulse 76  Ht _0  (1.6 m)   Wt 201 lb 12.8 oz (91.5 kg)   LMP  (LMP Unknown)   SpO2 99%   BMI 35.75 kg/m    Physical Exam:  General- No distress,  A&Ox3, pleasant ENT: No sinus tenderness, TM clear, pale nasal mucosa, no oral exudate,+ post nasal drip, no LAN Cardiac: S1, S2, regular rate and rhythm, no murmur Chest: No wheeze/ rales/ dullness; no accessory muscle use, no nasal flaring, no sternal retractions Abd.: Soft Non-tender, ND, BS+ Ext: No clubbing cyanosis, edema Neuro: deconditioned at baseline Skin: No rashes, warm and dry Psych: normal mood and behavior   Assessment/Plan  OSA (obstructive sleep apnea) Sleep study negative for OSA but + for nocturnal  desaturations Plan: Start oxygen at 2 L [place] at bedtime  COPD with asthma ([place]) Pt. Feels better with Trelegy Plan: Feno today. We will continue Trelegy 1 puff once daily Rinse mouth after use. Allegra 180 mg at bedtime or in the morning. Nasal saline as needed for dry sinuses. Nasal saline gel at bedtime. Flonase ( use generic) 2 squirts each nostril daily. Mucinex 1200 mg daily with full glass of water  Follow up in 2 months with Dr. Lake Bells or Judson Roch to see how you are doing. Please contact office for sooner follow up if symptoms do not improve or worsen or seek emergency care     GERD Restart Protonix 40 mg  GERD diet    Magdalen Spatz, NP 05/04/2017  6:46 PM

## 2017-05-04 NOTE — Patient Instructions (Addendum)
It is nice to meet you. Feno today. We will continue Trelegy 1 puff once daily Rinse mouth after use. We will order oxygen at 2 L [place] to be worn at bedtime. Allegra 180 mg at bedtime or in the morning. Nasal saline as needed for dry sinuses. Nasal saline gel at bedtime. Flonase ( use generic) 2 squirts each nostril daily. GERD diet Protonix 40 mg twice daily before meals. Follow up in 2 months with Dr. Lake Bells or Judson Roch to see how you are doing. Please contact office for sooner follow up if symptoms do not improve or worsen or seek emergency care

## 2017-05-04 NOTE — Assessment & Plan Note (Addendum)
Pt. Feels better with Trelegy Plan: Feno today. We will continue Trelegy 1 puff once daily Rinse mouth after use. Allegra 180 mg at bedtime or in the morning. Nasal saline as needed for dry sinuses. Nasal saline gel at bedtime. Flonase ( use generic) 2 squirts each nostril daily. Mucinex 1200 mg daily with full glass of water  Follow up in 2 months with Dr. Lake Bells or Judson Roch to see how you are doing. Please contact office for sooner follow up if symptoms do not improve or worsen or seek emergency care

## 2017-05-04 NOTE — Assessment & Plan Note (Signed)
Sleep study negative for OSA but + for nocturnal desaturations Plan: Start oxygen at 2 L [place] at bedtime

## 2017-05-04 NOTE — Assessment & Plan Note (Signed)
Restart Protonix 40 mg  GERD diet

## 2017-05-05 LAB — NITRIC OXIDE: NITRIC OXIDE: 7

## 2017-05-05 NOTE — Addendum Note (Signed)
Addended by: Madolyn Frieze on: 05/05/2017 09:56 AM   Modules accepted: Orders

## 2017-05-10 ENCOUNTER — Telehealth: Payer: Self-pay | Admitting: Pharmacist

## 2017-05-10 NOTE — Telephone Encounter (Signed)
Pt called clinic with elevated BP readings 140-160/80-100. Also states she has had a balance problem every day since increasing her lisinopril dose to 20mg  at last visit. States she is unsteady on her feet in the morning and when she walks in the afternoon. She is not experiencing any dizziness. States her foot is in a boot which is likely contributing.  Will increase lisinopril to 30mg  daily since she is hesitant to increase to 40mg  daily. She will keep f/u appt in 2 weeks.

## 2017-05-12 ENCOUNTER — Telehealth: Payer: Self-pay | Admitting: Cardiovascular Disease

## 2017-05-12 NOTE — Telephone Encounter (Signed)
Spoke with patient who called with concern that her nose is bleeding daily. She states she thinks it is related to her recent increased dose of lisinopril. I advised that lisinopril is not likely the cause and encouraged her to take note of other causes of nasal dryness. I encouraged her to see either her PCP or ENT for evaluation and that she can hold aspirin for a few days if bleeding is severe. I advised her to call back to inform us of any additional medication changes or requests from another doctor to hold aspirin or plavix. She verbalized understanding and agreement and thanked me for the call.

## 2017-05-12 NOTE — Telephone Encounter (Signed)
New message    Patient calling with concerns about nose bleeds, started on Monday. No chest pain No shortness of breath

## 2017-05-13 ENCOUNTER — Ambulatory Visit: Payer: Self-pay | Admitting: *Deleted

## 2017-05-13 NOTE — Telephone Encounter (Signed)
Patient phoned with complaint of nosebleeds for the last 5 days. She also stated she is having headaches everyday this week. Her blood pressure has been going up this week according to the readings: Monday 166/102  Tuesday 140/66 Wednesday 158/96 Thursday 151/88 Friday 160/94. Her Lisinopril was increased to  30 MG a day by Dr. Acie Fredrickson on 05/10/17. She denies any other symptoms at this time.   Reason for Disposition . [1] Mild-moderate nosebleed AND [2] bleeding stopped now  Answer Assessment - Initial Assessment Questions 1. AMOUNT OF BLEEDING: "How bad is the bleeding?" "How much blood was lost?" "Has the bleeding stopped?"   - MILD: needed a couple tissues   - MODERATE: needed many tissues   - SEVERE: large blood clots, soaked many tissues, lasted more than 30 minutes     Moderate-takes about 10 minutes to stop the bleeding 2. ONSET: "When did the nosebleed start?"     5 days ago. Her lisinopril was increased the Friday before the nose bleeds started.  3. FREQUENCY: "How many nosebleeds have you had in the last 24 hours?"      One nosebleeds a day. 4. RECURRENT SYMPTOMS: "Have there been other recent nosebleeds?" If so, ask: "How long did it take you to stop the bleeding?" "What worked best?"    no 5. CAUSE: "What do you think caused this nosebleed?"  Thinks the increase in her lisinopril to 40 MG last week started it.  6. LOCAL FACTORS: "Do you have any cold symptoms?", "Have you been rubbing or picking at your nose?"   no 7. SYSTEMIC FACTORS: "Do you have high blood pressure or any bleeding problems?"    Yes, blood pressure has increased a lot recently.  8. BLOOD THINNERS: "Do you take any blood thinners?" (e.g., coumadin, heparin, aspirin, Plavix)    Plavix-has had bypass surgery in the past.  9. OTHER SYMPTOMS: "Do you have any other symptoms?" (e.g., lightheadedness)   Headaches since the nosebleeds started this week. 10. PREGNANCY: "Is there any chance you are pregnant?" "When was  your last menstrual period?"  no.  Protocols used: [id number]

## 2017-05-14 ENCOUNTER — Encounter: Payer: Self-pay | Admitting: Family Medicine

## 2017-05-14 ENCOUNTER — Ambulatory Visit (INDEPENDENT_AMBULATORY_CARE_PROVIDER_SITE_OTHER): Payer: Medicare Other | Admitting: Family Medicine

## 2017-05-14 VITALS — BP 146/78 | HR 80 | Temp 98.3°F | Wt 193.0 lb

## 2017-05-14 DIAGNOSIS — J301 Allergic rhinitis due to pollen: Secondary | ICD-10-CM

## 2017-05-14 DIAGNOSIS — R040 Epistaxis: Secondary | ICD-10-CM | POA: Diagnosis not present

## 2017-05-14 DIAGNOSIS — I10 Essential (primary) hypertension: Secondary | ICD-10-CM

## 2017-05-14 MED ORDER — FLUTICASONE PROPIONATE 50 MCG/ACT NA SUSP: 2.0000 | Freq: Every day | NASAL | 6 refills | Status: DC

## 2017-05-14 NOTE — Patient Instructions (Signed)
Follow up w/ Dr Quay Burow and Dr Acie Fredrickson regarding your BP CONTINUE the Lisinopril 5mg  daily until you see one of your regular doctors CONTINUE to hold the aspirin until you are seen START the Flonase- 2 sprays each nostril daily Call with any questions or concerns Hang in there!  The blood pressure is great today!!!

## 2017-05-14 NOTE — Assessment & Plan Note (Signed)
Pt is convinced that the higher dose of Lisinopril (she was taking 40mg  daily) caused nose bleeds.  Since decreasing medication back to 5mg  daily, the nose bleeds have stopped.  BP today was adequately controlled and better than it has been.  She also denied dizziness today which she reports at higher doses of Lisinopril.  I told her that I was not comfortable adding or changing her medication as I was not the one to follow up on it, but that she could continue the Lisinopril 5mg  daily until she sees Cardiology or PCP.  Pt expressed understanding and is in agreement w/ plan.

## 2017-05-14 NOTE — Progress Notes (Signed)
   Subjective:    Patient ID: [name], female    DOB: Jun 25, 1940, 77 y.o.   MRN: [id number]  HPI Nose bleeds- pt feels that this is related to her Lisinopril.  'On 5mg  I had no problems.  Then they increased it to 20mg  and the only problem I had was my balance.  Then they increased it to 40mg  and I started the nose bleeds'.  Cardiology told pt that this was not related to her Lisinopril and to check w/ PCP.  Last nose bleed was Friday AM.  No bleeding yet today.  Pt reports some balance issues since starting Lisinopril.  Currently on Dilt 240mg , Lasix 40mg  BID, Metoprolol 50mg , and Lisinopril 40mg  daily- but the last 2 days she has taken 5mg  out of fear due to nosebleeds.     Review of Systems For ROS see HPI     Objective:   Physical Exam  Constitutional: She is oriented to person, place, and time. She appears well-developed and well-nourished. No distress.  HENT:  Head: Normocephalic and atraumatic.  Right Ear: Tympanic membrane normal.  Left Ear: Tympanic membrane normal.  Nose: Mucosal edema and rhinorrhea present. Right sinus exhibits no maxillary sinus tenderness and no frontal sinus tenderness. Left sinus exhibits no maxillary sinus tenderness and no frontal sinus tenderness.  Mouth/Throat: Mucous membranes are normal. Posterior oropharyngeal erythema (w/ PND) present.  Eyes: Pupils are equal, round, and reactive to light. Conjunctivae and EOM are normal.  Neck: Normal range of motion. Neck supple.  Cardiovascular: Normal rate, regular rhythm and normal heart sounds.  Pulmonary/Chest: Effort normal and breath sounds normal. No respiratory distress. She has no wheezes. She has no rales.  Lymphadenopathy:    She has no cervical adenopathy.  Neurological: She is alert and oriented to person, place, and time.  Psychiatric: She has a normal mood and affect. Her behavior is normal. Thought content normal.  Vitals reviewed.         Assessment & Plan:  Nose bleeds- no active  bleeding today and no visible scabbing on PE.  She does have marked turbinate edema, likely due to allergy congestion.  I told her this was the likely cause of her nose bleeds but she did not want to hear that.  Start Flonase to improve nasal congestion- cautioned her regarding risk of nose bleeds.  Pt expressed understanding and is in agreement w/ plan.

## 2017-05-16 ENCOUNTER — Telehealth: Payer: Self-pay | Admitting: Emergency Medicine

## 2017-05-16 NOTE — Telephone Encounter (Signed)
Called patient and was unable to leave a message (no voicemail box on one phone and voicemail was full on the other).  Patient is needing to schedule a visit with Dr Quay Burow to discuss her medications.

## 2017-05-16 NOTE — Telephone Encounter (Signed)
-----   Message from Binnie Rail, MD sent at 05/16/2017  1:11 PM EDT ----- Schedule FU with me for htn    ----- Message ----- From: Midge Minium, MD Sent: 05/14/2017   1:25 PM To: Binnie Rail, MD  Please reach out to her as she needs to schedule a BP recheck.  She is convinced her lisinopril is causing nose bleeds and dizziness (see today's note).  Thanks!

## 2017-05-17 ENCOUNTER — Other Ambulatory Visit: Payer: Self-pay | Admitting: Cardiovascular Disease

## 2017-05-18 ENCOUNTER — Other Ambulatory Visit: Payer: Self-pay | Admitting: Cardiovascular Disease

## 2017-05-19 ENCOUNTER — Ambulatory Visit (INDEPENDENT_AMBULATORY_CARE_PROVIDER_SITE_OTHER): Payer: Medicare Other | Admitting: Pharmacist

## 2017-05-19 VITALS — BP 138/68 | HR 68

## 2017-05-19 DIAGNOSIS — I10 Essential (primary) hypertension: Secondary | ICD-10-CM | POA: Diagnosis not present

## 2017-05-19 MED ORDER — METOPROLOL SUCCINATE ER 50 MG PO TB24: 75.0000 mg | ORAL_TABLET | Freq: Every day | ORAL | 10 refills | Status: DC

## 2017-05-19 MED ORDER — METOPROLOL SUCCINATE ER 25 MG PO TB24: 75.0000 mg | ORAL_TABLET | Freq: Every day | ORAL | 1 refills | Status: DC

## 2017-05-19 NOTE — Patient Instructions (Signed)
BMET today   INCREASE metoprolol to 75mg  (1.5 tablets) daily   STOP lisinopril   CONTINUE to monitor pressures and call with issues. Follow up with Dr. Acie Fredrickson as scheduled.

## 2017-05-19 NOTE — Progress Notes (Signed)
Patient ID: Pigeon COHICK                 DOB: [birth year]                      MRN: [id number]     HPI: [name] is a 77 y.o. female patient of Dr. Acie Fredrickson who presents today for hypertension evaluation, referred by Robbie Lis, PA. PMH significant for CAD s/p CABG, HTN, HLD (statin intolerance, she is followed in lipid clinic), DM, COPD-Asthma overlap syndrome, former smoker (She smoked 1/2 ppd for about 35 years, quit at age 47) GERD, OSA and chronic diastolic CHF. She has previously been seen by me for lipid management. She was started on Repatha which she did not tolerate due to muscle aching and the medication was discontinued. At her most recent OV her lisinopril was increased to 76m daily. She has since called with elevated pressures and her lisinopril was increased to 372mdaily. She has also called about nose bleeds and was wondering if this was related to her lisinopril. She was advised ok to hold asa for a day or two to help and likely unrelated to lisinopril.   She presents today for further BP management. She took her last dose of the lisinopril on Monday. She states that she had been taking lisinopril 4078maily, that this is causing her side effects - particularly nose bleeds. She states that the bleeding continued after stopping the aspirin.   Current HTN meds:  Diltiazem 240m41mily in the morning Furosemide 40mg67m Imdur 90mg 19my at dinner time Lisinopril 30mg d67m around lunch time - not taking due to nose bleeds Metoprolol succinate 50mg da40min the evening  Previously tried: amlodipine (hallucinations)  BP goal: <130/80  Diet: She tries to eat healthy as much as possible. Eats lean proteins, avoids red meats. Eats lots of greens. Very cautious about carbohydrates because she is a diabetic. Doesn't drink sodas. Drinks juice occasionally.   Exercise: Will be starting to walk 3x a week.   Family History: The patient's family history includes Diabetes in her  maternal grandfather and mother; Emphysema in her paternal aunt and paternal uncle; Esophageal cancer (age of onset: 32) in h84 brother; Heart attack in her father; Heart disease in her maternal grandfather; Heart failure in her maternal grandfather; Rheum arthritis in her sister; Stomach cancer in her father.  Social History: The patient reports that she quit smoking about 40 years ago. Her smoking use included Cigarettes. She started smoking about 61 years ago. She has a 10.50 pack-year smoking history. She has never used smokeless tobacco. She reports that she does not drink alcohol or use drugs.  Home BP readings: 156/83 -   Wt Readings from Last 3 Encounters:  05/14/17 193 lb (87.5 kg)  05/04/17 201 lb 12.8 oz (91.5 kg)  03/15/17 202 lb 12.8 oz (92 kg)   BP Readings from Last 3 Encounters:  05/19/17 138/68  05/14/17 (!) 146/78  05/04/17 140/88   Pulse Readings from Last 3 Encounters:  05/19/17 68  05/14/17 80  05/04/17 76    Renal function: Estimated Creatinine Clearance: 60 mL/min (by C-G formula based on SCr of 0.81 mg/dL).  Past Medical History:  Diagnosis Date  . Adenomatous colon polyp   . Allergy   . Anxiety   . Asthma   . Chest pain   . Chronic diastolic CHF (congestive heart failure) (HCC)   .[place] (chronic obstructive pulmonary  disease) [place])    pt is unsure if has been officially diagnosed  . Coronary artery disease    CABG '09- cathed 12/09, 9/10, 6/11, 3/14 and 12/13/16- medical Rx  . Diabetes mellitus   . GERD (gastroesophageal reflux disease)   . Hiatal hernia   . Hyperlipidemia   . Hypertension   . Myocardial infarction ([place]) 2009  . PONV (postoperative nausea and vomiting)   . Schatzki's ring   . Shoulder injury    resolved after shoulder surgery  . Sleep apnea    not on cpap    Current Outpatient Medications on File Prior to Visit  Medication Sig Dispense Refill  . albuterol (PROVENTIL HFA;VENTOLIN HFA) 108 (90 BASE) MCG/ACT inhaler  Inhale 2 puffs into the lungs every 6 (six) hours as needed. For shortness of breath. 1 Inhaler 0  . alum & mag hydroxide-simeth (MAALOX/MYLANTA) 200-200-20 MG/5ML suspension Take 30 mLs as needed by mouth for indigestion or heartburn.    Marland Kitchen aspirin EC 81 MG tablet Take 1 tablet (81 mg total) by mouth daily. (Patient not taking: Reported on 05/14/2017)    . baclofen (LIORESAL) 20 MG tablet Take 20 mg 2 (two) times daily by mouth.    . benzonatate (TESSALON) 100 MG capsule Take 100 mg by mouth 2 (two) times daily as needed for cough.     . Blood Glucose Monitoring Suppl (ONE TOUCH ULTRA MINI) w/Device KIT 3 (three) times daily. for testing  0  . budesonide-formoterol (SYMBICORT) 160-4.5 MCG/ACT inhaler Inhale 2 puffs into the lungs 2 (two) times daily.    . calcium carbonate (TUMS - DOSED IN MG ELEMENTAL CALCIUM) 500 MG chewable tablet Chew 2 tablets daily as needed by mouth for indigestion or heartburn.    . Cholecalciferol (VITAMIN D) 2000 UNITS tablet Take 2,000 Units by mouth daily.     . clonazePAM (KLONOPIN) 0.5 MG tablet Take 0.5 mg daily by mouth.    . clopidogrel (PLAVIX) 75 MG tablet Take 1 tablet (75 mg total) by mouth daily. 90 tablet 3  . Cyanocobalamin (B-12 COMPLIANCE INJECTION) 1000 MCG/ML KIT Inject 1,000 mcg every 30 (thirty) days as directed.    . cyclobenzaprine (FLEXERIL) 5 MG tablet TAKE 1 TABLET BY MOUTH AT BEDTIME 30 tablet 0  . diclofenac sodium (VOLTAREN) 1 % GEL Apply 2 g topically 4 (four) times daily as needed (muscle pain).    Marland Kitchen diltiazem (CARDIZEM CD) 240 MG 24 hr capsule Take 1 capsule (240 mg total) by mouth daily. 30 capsule 10  . fexofenadine (ALLEGRA) 180 MG tablet Take 1 tablet (180 mg total) by mouth daily. 30 tablet 6  . fluticasone (FLONASE) 50 MCG/ACT nasal spray Place 2 sprays into both nostrils daily. 16 g 6  . Fluticasone-Umeclidin-Vilant (TRELEGY ELLIPTA) 100-62.5-25 MCG/INH AEPB Inhale 1 puff into the lungs daily. 1 each 0  . furosemide (LASIX) 40 MG tablet  Take 1 tablet (40 mg total) by mouth daily. (Patient taking differently: Take 40 mg by mouth 2 (two) times daily. ) 90 tablet 3  . insulin lispro protamine-insulin lispro (HUMALOG 75/25) (75-25) 100 UNIT/ML SUSP Inject 20-30 Units into the skin See admin instructions. Takes 20 units in the morning and 30  units with supper    . isosorbide mononitrate (IMDUR) 60 MG 24 hr tablet Take 1.5 tablets (90 mg total) by mouth daily. 135 tablet 3  . loperamide (IMODIUM) 2 MG capsule Take 2 mg by mouth as needed for diarrhea or loose stools.     Marland Kitchen  meclizine (ANTIVERT) 25 MG tablet Take 25 mg by mouth 2 (two) times daily as needed for dizziness.     . methocarbamol (ROBAXIN) 500 MG tablet Take 500 mg 2 (two) times daily by mouth.    . montelukast (SINGULAIR) 5 MG chewable tablet Chew 1 tablet (5 mg total) by mouth at bedtime. 30 tablet 3  . Multiple Vitamin (MULTIVITAMIN WITH MINERALS) TABS Take 1 tablet by mouth daily.    . Nebulizers (COMPRESSOR/NEBULIZER) MISC Use with albuterol 1 each 0  . nitroGLYCERIN (NITROSTAT) 0.4 MG SL tablet Place 0.4 mg under the tongue every 5 (five) minutes as needed for chest pain.    . nortriptyline (PAMELOR) 10 MG capsule Take 1 capsule (10 mg total) by mouth at bedtime. 30 capsule 2  . ONE TOUCH ULTRA TEST test strip CHECK BLOOD SUGAR TWICE A DAY DX: E11.9  3  . ONETOUCH DELICA LANCETS 62T MISC 3 (three) times daily. for testing  0  . Pancrelipase, Lip-Prot-Amyl, 24000 units CPEP Take 2 capsules (48,000 Units total) by mouth 3 (three) times daily with meals. Take 1 capsule with a snack 210 capsule 6  . pantoprazole (PROTONIX) 40 MG tablet Take 1 tablet (40 mg total) by mouth 2 (two) times daily before a meal. 180 tablet 3  . Spacer/Aero-Holding Chambers (AEROCHAMBER MV) inhaler Use as instructed 1 each 0  . traZODone (DESYREL) 50 MG tablet Take 1-2 tablets (50-100 mg total) by mouth at bedtime as needed for sleep. 180 tablet 0   Current Facility-Administered Medications on  File Prior to Visit  Medication Dose Route Frequency Provider Last Rate Last Dose  . cyanocobalamin ((VITAMIN B-12)) injection 1,000 mcg  1,000 mcg Intramuscular Q30 days Binnie Rail, MD   1,000 mcg at 03/26/16 1658    Allergies  Allergen Reactions  . Amlodipine Other (See Comments)    hallucinations   . Banana Nausea And Vomiting    Stomach pumped  . Co Q10 [Coenzyme Q10]     Body cramps  . Codeine     Hallucinate, loose identity and don't know who I am  . Morphine Other (See Comments)    Can not function, it immobilizes me   . Pentazocine   . Pravastatin     Hands locked up  . Repatha [Evolocumab]     myalgias  . Statins     Muscle cramps  . Sulfa Antibiotics   . Sulfonamide Derivatives Swelling  . Tramadol Hcl     Blood pressure 138/68, pulse 68.   Assessment/Plan: Hypertension: BMET today.  BP today is not at goal. Will increase metoprolol to 36m daily and stop her lisinopril due to side effects. Follow up with Dr. NAcie Fredricksonas scheduled and HTN clinic as needed after.   Thank you, KLelan Pons APatterson Hammersmith PharmD  [facility]   ADDENDUM: BMET WNL. Continue therapy as above.

## 2017-05-20 LAB — BASIC METABOLIC PANEL
BUN / CREAT RATIO: 19 (ref 12–28)
BUN: 15 mg/dL (ref 8–27)
CALCIUM: 8.9 mg/dL (ref 8.7–10.3)
CHLORIDE: 101 mmol/L (ref 96–106)
CO2: 28 mmol/L (ref 20–29)
Creatinine, Ser: 0.81 mg/dL (ref 0.57–1.00)
GFR calc non Af Amer: 70 mL/min/{1.73_m2} (ref >59)
GFR, EST AFRICAN AMERICAN: 80 mL/min/{1.73_m2} (ref >59)
GLUCOSE: 69 mg/dL (ref 65–99)
POTASSIUM: 4.1 mmol/L (ref 3.5–5.2)
Sodium: 144 mmol/L (ref 134–144)

## 2017-05-27 ENCOUNTER — Other Ambulatory Visit: Payer: Self-pay | Admitting: Pharmacist

## 2017-05-27 MED ORDER — METOPROLOL SUCCINATE ER 50 MG PO TB24: ORAL_TABLET | ORAL | 1 refills | Status: DC

## 2017-06-07 ENCOUNTER — Ambulatory Visit (INDEPENDENT_AMBULATORY_CARE_PROVIDER_SITE_OTHER)
Admission: RE | Admit: 2017-06-07 | Discharge: 2017-06-07 | Disposition: A | Discharge status: Home / Self Care | Payer: Medicare Other | Source: Ambulatory Visit | Attending: Pulmonary Disease | Admitting: Pulmonary Disease

## 2017-06-07 DIAGNOSIS — R911 Solitary pulmonary nodule: Secondary | ICD-10-CM | POA: Diagnosis not present

## 2017-06-08 ENCOUNTER — Other Ambulatory Visit: Payer: Self-pay

## 2017-06-08 DIAGNOSIS — J449 Chronic obstructive pulmonary disease, unspecified: Secondary | ICD-10-CM

## 2017-06-08 DIAGNOSIS — R0602 Shortness of breath: Secondary | ICD-10-CM

## 2017-06-08 DIAGNOSIS — R911 Solitary pulmonary nodule: Secondary | ICD-10-CM

## 2017-06-12 ENCOUNTER — Other Ambulatory Visit: Payer: Self-pay | Admitting: Cardiovascular Disease

## 2017-06-22 ENCOUNTER — Ambulatory Visit (INDEPENDENT_AMBULATORY_CARE_PROVIDER_SITE_OTHER): Payer: Medicare Other | Admitting: Pulmonary Disease

## 2017-06-22 ENCOUNTER — Encounter: Payer: Self-pay | Admitting: Pulmonary Disease

## 2017-06-22 VITALS — BP 160/100 | HR 71 | Ht 63.0 in | Wt 196.4 lb

## 2017-06-22 DIAGNOSIS — J449 Chronic obstructive pulmonary disease, unspecified: Secondary | ICD-10-CM | POA: Diagnosis not present

## 2017-06-22 DIAGNOSIS — G4733 Obstructive sleep apnea (adult) (pediatric): Secondary | ICD-10-CM

## 2017-06-22 DIAGNOSIS — R911 Solitary pulmonary nodule: Secondary | ICD-10-CM

## 2017-06-22 DIAGNOSIS — R918 Other nonspecific abnormal finding of lung field: Secondary | ICD-10-CM

## 2017-06-22 MED ORDER — PREDNISONE 10 MG PO TABS: 20.0000 mg | ORAL_TABLET | Freq: Every day | ORAL | 0 refills | Status: AC

## 2017-06-22 NOTE — Patient Instructions (Signed)
Chest pain: If no improvement after prednisone call me, I can call in some hypertonic saline nebulizer solution to help clear the mucus  Acute exacerbation of COPD-asthma: Take prednisone 20 mg daily x5 days Continue Trelegy 1 puff daily Continue albuterol as needed for chest tightness wheezing or shortness of breath  Obstructive sleep apnea: CPAP nightly  We will see you back in 4 to 6 months or sooner if needed

## 2017-06-22 NOTE — Progress Notes (Signed)
Subjective:   PATIENT ID: [name] GENDER: female DOB: [birth year], MRN: [id number]  Synopsis: Former patient of Dr. Ashok Cordia with COPD-Asthma overlap syndrome, GERD, OSA; She is a retired Education officer, museum from [employer] She smoked 1/2 ppd for about 35 years, quit at age 76.  She like to avoid steroids when she has flares of her disease.  She will take them if she has to though.   HPI  Chief Complaint  Patient presents with  . Follow-up    2 month ROV, SOB, using rescue inhaler more than twice a day, non-productive cough    [name] is getting around OK, her left foot is still swollen but it is improving. She says that over the last two weeks her breathing has been a little worse, more cough, more dyspnea.  She has been using albuterol three times per day.  She does'nt feel sick, no sick contacts.  She has chest tightness, albuteorl helps.  The cough is dry.  She notes some chest tightness and pain on the right side.  She says this is been there for quite some time.  It persists for about an hour most afternoons.  Past Medical History:  Diagnosis Date  . Adenomatous colon polyp   . Allergy   . Anxiety   . Asthma   . Chest pain   . Chronic diastolic CHF (congestive heart failure) ([place])   . COPD (chronic obstructive pulmonary disease) ([place])    pt is unsure if has been officially diagnosed  . Coronary artery disease    CABG '09- cathed 12/09, 9/10, 6/11, 3/14 and 12/13/16- medical Rx  . Diabetes mellitus   . GERD (gastroesophageal reflux disease)   . Hiatal hernia   . Hyperlipidemia   . Hypertension   . Myocardial infarction ([place]) 2009  . PONV (postoperative nausea and vomiting)   . Schatzki's ring   . Shoulder injury    resolved after shoulder surgery  . Sleep apnea    not on cpap     Family History  Problem Relation Age of Onset  . Heart disease Maternal Grandfather   . Heart failure Maternal Grandfather   . Diabetes Maternal Grandfather   . Heart attack  Father   . Diabetes Mother   . Rheum arthritis Sister   . Emphysema Paternal Uncle   . Esophageal cancer Brother 60       she said he was born with it  . Emphysema Paternal Aunt   . Neuropathy Neg Hx   . Multiple sclerosis Neg Hx   . Colon cancer Neg Hx   . Colon polyps Neg Hx   . Rectal cancer Neg Hx   . Stomach cancer Neg Hx       Review of Systems  Constitutional: Negative for malaise/fatigue and weight loss.  HENT: Negative for congestion, nosebleeds and sore throat.   Respiratory: Positive for cough, sputum production, shortness of breath and wheezing.   Cardiovascular: Negative for orthopnea, claudication and leg swelling.  Neurological: Negative for weakness.      Objective:  Physical Exam   Vitals:   06/22/17 1356  BP: (!) 160/100  Pulse: 71  SpO2: 97%  Weight: 196 lb 6.4 oz (89.1 kg)  Height: 5' 3" (1.6 m)   Gen: well appearing HENT: OP clear, TM's clear, neck supple PULM: Limited air movement but no wheezing B, normal percussion CV: RRR, no mgr, trace edema GI: BS+, soft, nontender Derm: no cyanosis or rash Psyche:  normal mood and affect   CBC    Component Value Date/Time   WBC 6.1 01/04/2017 1505   RBC 3.89 01/04/2017 1505   HGB 9.3 (L) 01/04/2017 1505   HGB 10.6 (L) 12/10/2016 1346   HCT 31.3 (L) 01/04/2017 1505   HCT 34.1 12/10/2016 1346   PLT 271 01/04/2017 1505   PLT 270 12/10/2016 1346   MCV 80.5 01/04/2017 1505   MCV 78 (L) 12/10/2016 1346   MCH 23.9 (L) 01/04/2017 1505   MCHC 29.7 (L) 01/04/2017 1505   RDW 16.4 (H) 01/04/2017 1505   RDW 16.1 (H) 12/10/2016 1346   LYMPHSABS 2.2 06/21/2016 1628   MONOABS 0.6 05/19/2016 1152   EOSABS 0.1 06/21/2016 1628   BASOSABS 0.0 06/21/2016 1628    PFT 12/23/16: FVC 1.44 L (72%) FEV1 0.96 L (63%) FEV1/FVC 0.67 FEF 25-75 0.55 L (42%) negative bronchodilator response TLC 3.85 L (78%) RV 102% DLCO uncorrected 45% 06/04/15: FVC 1.57 L (77%) FEV1 1.01 L (64%) FEV1/FVC 0.65 FEF 25-75 0.54 L (39%)  negative bronchodilator response 02/21/15: FVC 1.60 L (78%) FEV1 1.03 L (65%) FEV1/FVC 0.64 FEF 25-75 0.51 L (37%) positive bronchodilator response 11/20/14: FVC 1.44 L (70%) FEV1 0.91 L (57%) FEV1/FVC 0.63 FEF 25-75 0.47 L (33%) positive bronchodilator response TLC 3.92 L (79%) RV 103% DLCO uncorrected 64% 11/12/13: FVC 1.57 L (74%) FEV1 1.09 L (67%) FEV1/FVC 0.70 FEF 25-75 0.72 L (50%) negative bronchodilator response 08/04/11: FVC 1.61 L (60%) FEV1 0.89 L (48%) FEV1/FVC 0.56 FEF 25-75 0.29 L (14%) positive bronchodilator response TLC 3.67 L (80%) RV 103% ERV 46% DLCO corrected 38%  6MWT 11/20/14:  Walked 336 meters / Baseline Sat 100% on RA / Nadir Sat 99% on RA (pt c/o pain in her right neck w/ dyspnea)  IMAGING November 2018 CT angiogram chest images independently reviewed showing centrilobular predominant emphysema, mild in an upper lobe distribution, no PE, there is a 5 mm right upper lobe nodule.  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  Emphysema noted.  New atelectasis versus scar in the right lower lobe; images indepdnetly reviewed and this is my interpretation  CARDIAC LHC (12/14/16):  Prox LAD lesion is 80% stenosed. LIMA to LAD is patent.  Prox RCA lesion is 25% stenosed. SVG to PDA is occluded.  Mid RCA lesion is 25% stenosed.  Circumflex is a large tortuous vessel.  Ost 1st Diag lesion is 100% stenosed. SVG to diagonal is patent.  The left ventricular systolic function is normal.  LV end diastolic pressure is normal.  The left ventricular ejection fraction is 55-65% by visual estimate.  There is no aortic valve stenosis.  TTE (10/06/11): LV normal in size. Normal regional wall motion. EF 60-65%. Grade 1 diastolic dysfunction. LA & RA normal in size. 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 RV normal in size and function. RVSP 19 mmHg. No aortic stenosis or regurgitation. No mitral  stenosis or regurgitation. No pulmonic stenosis. Trivial tricuspid regurgitation. No pericardial effusion.  LABS 03/13/15 Procalcitonin:  <0.1 CBC: 10.9/11.8/37.3/245  10/08/14 IgG: 1060 IgM: 112 IgA: 272 IgE: 5 CBC: 6.1/11.3/36.0/293 Differential: Eos 0.3 (4.5%)  RAST panel: Negative Aspergillus antigen: <0.1  1/26/9 ABG:  7.39/44/113  Hospitalization records from November 2018 reviewed where she was hospitalized for chest pain     Assessment & Plan:   COPD with asthma ([place])  Pulmonary nodule  OSA (obstructive sleep apnea)  Abnormal findings on  diagnostic imaging of lung - Plan: CT Chest Wo Contrast  Discussion: In general Virginie has been doing better with the addition of Trelegy for her COPD/asthma overlap syndrome.  However, she is having a flareup of her disease right now based on her worsening symptoms and increased need for albuterol.  This is likely related to the environmental allergens out right now.  I am pleased that the pulmonary nodule in the right upper lobe has not changed.  However, there is a new nodule in the right lower lobe.  Based on the localized atelectasis nearby I am hopeful this is just mucus plugging.  In fact, I think this likely represents the discomfort that she feels as some patients will have mucus plugging because pleuritic type chest pain.  Plan: Chest pain: If no improvement after prednisone call me, I can call in some hypertonic saline nebulizer solution to help clear the mucus  Acute exacerbation of COPD-asthma: Take prednisone 20 mg daily x5 days Continue Trelegy 1 puff daily Continue albuterol as needed for chest tightness wheezing or shortness of breath  Obstructive sleep apnea: CPAP nightly  Pulmonary nodule: Will repeat CT chest in 6 months  We will see you back in 4 to 6 months or sooner if needed    Current Outpatient Medications:  .  albuterol (PROVENTIL HFA;VENTOLIN HFA) 108 (90 BASE) MCG/ACT inhaler, Inhale 2 puffs  into the lungs every 6 (six) hours as needed. For shortness of breath., Disp: 1 Inhaler, Rfl: 0 .  alum & mag hydroxide-simeth (MAALOX/MYLANTA) 200-200-20 MG/5ML suspension, Take 30 mLs as needed by mouth for indigestion or heartburn., Disp: , Rfl:  .  aspirin EC 81 MG tablet, Take 1 tablet (81 mg total) by mouth daily., Disp: , Rfl:  .  baclofen (LIORESAL) 20 MG tablet, Take 20 mg 2 (two) times daily by mouth., Disp: , Rfl:  .  benzonatate (TESSALON) 100 MG capsule, Take 100 mg by mouth 2 (two) times daily as needed for cough. , Disp: , Rfl:  .  Blood Glucose Monitoring Suppl (ONE TOUCH ULTRA MINI) w/Device KIT, 3 (three) times daily. for testing, Disp: , Rfl: 0 .  budesonide-formoterol (SYMBICORT) 160-4.5 MCG/ACT inhaler, Inhale 2 puffs into the lungs 2 (two) times daily., Disp: , Rfl:  .  calcium carbonate (TUMS - DOSED IN MG ELEMENTAL CALCIUM) 500 MG chewable tablet, Chew 2 tablets daily as needed by mouth for indigestion or heartburn., Disp: , Rfl:  .  Cholecalciferol (VITAMIN D) 2000 UNITS tablet, Take 2,000 Units by mouth daily. , Disp: , Rfl:  .  clonazePAM (KLONOPIN) 0.5 MG tablet, Take 0.5 mg daily by mouth., Disp: , Rfl:  .  clopidogrel (PLAVIX) 75 MG tablet, Take 1 tablet (75 mg total) by mouth daily., Disp: 90 tablet, Rfl: 3 .  Cyanocobalamin (B-12 COMPLIANCE INJECTION) 1000 MCG/ML KIT, Inject 1,000 mcg every 30 (thirty) days as directed., Disp: , Rfl:  .  cyclobenzaprine (FLEXERIL) 5 MG tablet, TAKE 1 TABLET BY MOUTH AT BEDTIME, Disp: 30 tablet, Rfl: 0 .  diclofenac sodium (VOLTAREN) 1 % GEL, Apply 2 g topically 4 (four) times daily as needed (muscle pain)., Disp: , Rfl:  .  diltiazem (CARDIZEM CD) 240 MG 24 hr capsule, Take 1 capsule (240 mg total) by mouth daily., Disp: 30 capsule, Rfl: 10 .  ezetimibe (ZETIA) 10 MG tablet, Take 10 mg by mouth daily., Disp: , Rfl: 6 .  fexofenadine (ALLEGRA) 180 MG tablet, Take 1 tablet (180 mg total) by mouth daily., Disp:  30 tablet, Rfl: 6 .   fluticasone (FLONASE) 50 MCG/ACT nasal spray, Place 2 sprays into both nostrils daily., Disp: 16 g, Rfl: 6 .  Fluticasone-Umeclidin-Vilant (TRELEGY ELLIPTA) 100-62.5-25 MCG/INH AEPB, Inhale 1 puff into the lungs daily., Disp: 1 each, Rfl: 0 .  insulin lispro protamine-insulin lispro (HUMALOG 75/25) (75-25) 100 UNIT/ML SUSP, Inject 20-30 Units into the skin See admin instructions. Takes 20 units in the morning and 30  units with supper, Disp: , Rfl:  .  loperamide (IMODIUM) 2 MG capsule, Take 2 mg by mouth as needed for diarrhea or loose stools. , Disp: , Rfl:  .  meclizine (ANTIVERT) 25 MG tablet, Take 25 mg by mouth 2 (two) times daily as needed for dizziness. , Disp: , Rfl:  .  methocarbamol (ROBAXIN) 500 MG tablet, Take 500 mg 2 (two) times daily by mouth., Disp: , Rfl:  .  metoprolol succinate (TOPROL-XL) 50 MG 24 hr tablet, Take 1.5 tablets daily by mouth, Disp: 135 tablet, Rfl: 1 .  montelukast (SINGULAIR) 5 MG chewable tablet, Chew 1 tablet (5 mg total) by mouth at bedtime., Disp: 30 tablet, Rfl: 3 .  Multiple Vitamin (MULTIVITAMIN WITH MINERALS) TABS, Take 1 tablet by mouth daily., Disp: , Rfl:  .  Nebulizers (COMPRESSOR/NEBULIZER) MISC, Use with albuterol, Disp: 1 each, Rfl: 0 .  nitroGLYCERIN (NITROSTAT) 0.4 MG SL tablet, Place 0.4 mg under the tongue every 5 (five) minutes as needed for chest pain., Disp: , Rfl:  .  ONE TOUCH ULTRA TEST test strip, CHECK BLOOD SUGAR TWICE A DAY DX: E11.9, Disp: , Rfl: 3 .  ONETOUCH DELICA LANCETS 79Y MISC, 3 (three) times daily. for testing, Disp: , Rfl: 0 .  Pancrelipase, Lip-Prot-Amyl, 24000 units CPEP, Take 2 capsules (48,000 Units total) by mouth 3 (three) times daily with meals. Take 1 capsule with a snack, Disp: 210 capsule, Rfl: 6 .  pantoprazole (PROTONIX) 40 MG tablet, Take 1 tablet (40 mg total) by mouth 2 (two) times daily before a meal., Disp: 180 tablet, Rfl: 3 .  Spacer/Aero-Holding Chambers (AEROCHAMBER MV) inhaler, Use as instructed, Disp: 1  each, Rfl: 0 .  traZODone (DESYREL) 50 MG tablet, Take 1-2 tablets (50-100 mg total) by mouth at bedtime as needed for sleep., Disp: 180 tablet, Rfl: 0 .  furosemide (LASIX) 40 MG tablet, Take 1 tablet (40 mg total) by mouth daily. (Patient taking differently: Take 40 mg by mouth 2 (two) times daily. ), Disp: 90 tablet, Rfl: 3 .  isosorbide mononitrate (IMDUR) 60 MG 24 hr tablet, Take 1.5 tablets (90 mg total) by mouth daily., Disp: 135 tablet, Rfl: 3 .  nortriptyline (PAMELOR) 10 MG capsule, Take 1 capsule (10 mg total) by mouth at bedtime. (Patient not taking: Reported on 06/22/2017), Disp: 30 capsule, Rfl: 2 .  predniSONE (DELTASONE) 10 MG tablet, Take 2 tablets (20 mg total) by mouth daily with breakfast for 5 days., Disp: 10 tablet, Rfl: 0  Current Facility-Administered Medications:  .  cyanocobalamin ((VITAMIN B-12)) injection 1,000 mcg, 1,000 mcg, Intramuscular, Q30 days, Binnie Rail, MD, 1,000 mcg at 03/26/16 1658

## 2017-06-24 ENCOUNTER — Ambulatory Visit (INDEPENDENT_AMBULATORY_CARE_PROVIDER_SITE_OTHER): Payer: Medicare Other | Admitting: Cardiovascular Disease

## 2017-06-24 ENCOUNTER — Encounter: Payer: Self-pay | Admitting: Cardiovascular Disease

## 2017-06-24 VITALS — BP 130/66 | HR 62 | Ht 63.0 in | Wt 196.1 lb

## 2017-06-24 DIAGNOSIS — I10 Essential (primary) hypertension: Secondary | ICD-10-CM

## 2017-06-24 DIAGNOSIS — E785 Hyperlipidemia, unspecified: Secondary | ICD-10-CM | POA: Diagnosis not present

## 2017-06-24 NOTE — Patient Instructions (Signed)
Your physician recommends that you continue on your current medications as directed. Please refer to the Current Medication list given to you today. Your physician recommends that you return for lab work in: 6 months (BMET, LIPIDS/LIVER)--prior to next appointment with APP.  Your physician wants you to follow-up in: 6 months with physician extender.   You will receive a reminder letter in the mail two months in advance. If you don't receive a letter, please call our office to schedule the follow-up appointment.

## 2017-06-24 NOTE — Progress Notes (Signed)
Cardiology Office Note   Date:  06/24/2017   ID:  [name], DOB June 01, 1940, MRN [id number]  PCP:  Binnie Rail, MD  Cardiologist:   Mertie Moores, MD   No chief complaint on file.  1. Coronary artery disease-status post CABG, the saphenous vein graft to the right coronary artery is severely diseased but she has good flow to the distal right coronary artery from the native circulation. 2. Diabetes mellitus 3. Hypertension 4. Hyperlipidemia 5. COPD: June, 2013 - FEV1 equals 0.89 L which is 48% of predicted value. Her DLCO is also markedly depressed. 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   [name] is a 77 y.o. female with the above noted hx. She continues to have episodes of sharp pain ( few seconds) followed by a burning pain ( last 6-7 minutes). She usually does not take NTG because it causes a headache. She was started on Lasix earlier this year. She seems to be doing much better on Lasix. She's had only function test which revealed severe obstructive lung disease with a good initial response to bronchodilators.  She is scheduled to have surgery all right knee. She's here today for preoperative evaluation prior to having the surgery.  September 04, 2012:  Ann is seen today for a follow up visit. She saw Richardson Dopp in April and was started on bystolic. She has tolerated that fairly well. She has some episodes of CP that are constant. She is very stable.   03/01/2013:  She continues to have significant CP. Still short of breath. She has no energy. Just does not feel well in general.  She does not get much sleep - perhaps 3 hours a night.   Jan. 8, 2016:  [name] is a 77 yo who we follow for CAD, DM, HTN, hyperlipidemia, . She has COPD: FEV1 equals 0.89 L which is 48% of predicted value. Her DLCO is also markedly depressed. She has severe obstructive lung disease with a beneficial response to  bronchodilators.  Breathing is the same. Has some upper respiratory issues.  Still has some tightness.    Jun 17, 2014:    [name] is a 77 y.o. female who presents for episodes of CP    Started 1 1/2 months ago.  Has tried mylanta - thought it was gas.  Did not get any better Occurs wjith exertion or at rest. , left sided chest pain   No radiation Gets lightheaded. Has tried NTG which seems to help.    Nov. 15 2016:  Has not been feeling well for the past couple of months Has a chest pressure when she gets out of bed.  Occurs twice a day - in the AM when she gets up  And then again at 3:30 in the afternoon.   Is active between those times , makes home visit.   Does water aerobics without any CP / pressure.   Wears home O2 at night.     Jun 23, 2015: Doing the same.  Still has the same chest pain  Last cath was in 2014 - showed patent LIMA to LAD, SVG to D1, the SVG to RCA was occluded   Native RCA has a 40-50% prox stenosis   The pains occur typically twice a day - Upon awakening and around 7 PM.   Nov. 20, 2017:  Has had lots of fatigue / pressure in her chest . Increased shortness of breath walking a short  distance  Has COPD - had issues with bronchitis ,  Was on prednisone  Pain was relieved with NTG and also by taking additional aspirin   April 08, 2016:  She has had some GI bleeding from diverticulitis  Was in the hospital for 4 days  Has been seeing Dr. Fuller Plan.   Aug. 14, 2018: [name] is seen today for follow up    GI bleeding has resolved  Breathing is still an issue.  Feet are swollen by the end of the day  ( no swelling this am )   Jun 24, 2017:  Her BP has been variable  Her dyspnea has been worse recently . Having trouble sleeping at night.   Has been given home O2 .   Never arrived    Past Medical History:  Diagnosis Date  . Adenomatous colon polyp   . Allergy   . Anxiety   . Asthma   . Chest pain   . Chronic diastolic CHF (congestive  heart failure) ([place])   . COPD (chronic obstructive pulmonary disease) ([place])    pt is unsure if has been officially diagnosed  . Coronary artery disease    CABG '09- cathed 12/09, 9/10, 6/11, 3/14 and 12/13/16- medical Rx  . Diabetes mellitus   . GERD (gastroesophageal reflux disease)   . Hiatal hernia   . Hyperlipidemia   . Hypertension   . Myocardial infarction ([place]) 2009  . PONV (postoperative nausea and vomiting)   . Schatzki's ring   . Shoulder injury    resolved after shoulder surgery  . Sleep apnea    not on cpap    Past Surgical History:  Procedure Laterality Date  . ABDOMINAL HYSTERECTOMY    . APPENDECTOMY     came out with Hysterectomy  . CARDIAC CATHETERIZATION  07/23/2009   EF 60%  . CARDIAC CATHETERIZATION  10/11/2008  . CARDIAC CATHETERIZATION  03/01/2007   EF 75-80%  . CARDIOVASCULAR STRESS TEST  11/15/2007   EF 60%  . COLONOSCOPY    . CORONARY ARTERY BYPASS GRAFT     SEVERELY DISEASED SAPHENOUS VEIN GRAFT TO THE RIGHT CORONARY ARTERY BUT WITH FAIRLY WELL PRESERVED FLOW TO THE DISTAL RIGHT CORONARY ARTERY FROM THE NATIVE CIRCULATION-RESTART  CATH IN JUNE 2000, REVEALS MILD/MODERATE  CAD WITH GOOD FLOW DOWN HER LAD  . ESOPHAGOGASTRODUODENOSCOPY    . EYE SURGERY     bilateral cataract surgery with lens implant  . LEFT HEART CATH AND CORS/GRAFTS ANGIOGRAPHY N/A 12/14/2016   Procedure: LEFT HEART CATH AND CORS/GRAFTS ANGIOGRAPHY;  Surgeon: Jettie Booze, MD;  Location: [facility];  Service: Cardiovascular;  Laterality: N/A;  . lense removal Left   . POLYPECTOMY    . ROTATOR CUFF REPAIR     right and left  . TONSILLECTOMY     age 77  . 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 tumor removed kidney    . UPPER GASTROINTESTINAL ENDOSCOPY    . US ECHOCARDIOGRAPHY  03/08/2008   EF 55-60%     Current Outpatient  Medications  Medication Sig Dispense Refill  . albuterol (PROVENTIL HFA;VENTOLIN HFA) 108 (90 BASE) MCG/ACT inhaler Inhale 2 puffs into the lungs every 6 (six) hours as needed. For shortness of breath. 1 Inhaler 0  . ALPRAZolam (XANAX) 0.5 MG tablet Take 0.5 mg by mouth 2 (two) times daily.  2  . alum & mag hydroxide-simeth (MAALOX/MYLANTA) 200-200-20 MG/5ML suspension Take 30 mLs as needed by mouth for indigestion or heartburn.    Marland Kitchen aspirin EC 81 MG tablet Take 1 tablet (81 mg total) by mouth daily.    . baclofen (LIORESAL) 20 MG tablet Take 20 mg 2 (two) times daily by mouth.    . benzonatate (TESSALON) 100 MG capsule Take 100 mg by mouth 2 (two) times daily as needed for cough.     . Blood Glucose Monitoring Suppl (ONE TOUCH ULTRA MINI) w/Device KIT 3 (three) times daily. for testing  0  . budesonide-formoterol (SYMBICORT) 160-4.5 MCG/ACT inhaler Inhale 2 puffs into the lungs 2 (two) times daily.    . calcium carbonate (TUMS - DOSED IN MG ELEMENTAL CALCIUM) 500 MG chewable tablet Chew 2 tablets daily as needed by mouth for indigestion or heartburn.    . Cholecalciferol (VITAMIN D) 2000 UNITS tablet Take 2,000 Units by mouth daily.     . clonazePAM (KLONOPIN) 0.5 MG tablet Take 0.5 mg daily by mouth.    . clopidogrel (PLAVIX) 75 MG tablet Take 1 tablet (75 mg total) by mouth daily. 90 tablet 3  . Cyanocobalamin (B-12 COMPLIANCE INJECTION) 1000 MCG/ML KIT Inject 1,000 mcg every 30 (thirty) days as directed.    . cyclobenzaprine (FLEXERIL) 5 MG tablet TAKE 1 TABLET BY MOUTH AT BEDTIME 30 tablet 0  . diclofenac sodium (VOLTAREN) 1 % GEL Apply 2 g topically 4 (four) times daily as needed (muscle pain).    Marland Kitchen diltiazem (CARDIZEM CD) 240 MG 24 hr capsule Take 1 capsule (240 mg total) by mouth daily. 30 capsule 10  . ezetimibe (ZETIA) 10 MG tablet Take 10 mg by mouth daily.  6  . fexofenadine (ALLEGRA) 180 MG tablet Take 1 tablet (180 mg total) by mouth daily. 30 tablet 6  . fluticasone (FLONASE) 50  MCG/ACT nasal spray Place 2 sprays into both nostrils daily. 16 g 6  . Fluticasone-Umeclidin-Vilant (TRELEGY ELLIPTA) 100-62.5-25 MCG/INH AEPB Inhale 1 puff into the lungs daily. 1 each 0  . insulin lispro protamine-insulin lispro (HUMALOG 75/25) (75-25) 100 UNIT/ML SUSP Inject 20-30 Units into the skin See admin instructions. Takes 20 units in the morning and 30  units with supper    . loperamide (IMODIUM) 2 MG capsule Take 2 mg by mouth as needed for diarrhea or loose stools.     . meclizine (ANTIVERT) 25 MG tablet Take 25 mg by mouth 2 (two) times daily as needed for dizziness.     . methocarbamol (ROBAXIN) 500 MG tablet Take 500 mg 2 (two) times daily by mouth.    . metoprolol succinate (TOPROL-XL) 50 MG 24 hr tablet Take 1.5 tablets daily by mouth 135 tablet 1  . montelukast (SINGULAIR) 5 MG chewable tablet Chew 1 tablet (5 mg total) by mouth at bedtime. 30 tablet 3  . Multiple Vitamin (MULTIVITAMIN WITH MINERALS) TABS Take 1 tablet by mouth daily.    . Nebulizers (COMPRESSOR/NEBULIZER) MISC Use with albuterol 1 each 0  . nitroGLYCERIN (NITROSTAT) 0.4 MG SL tablet Place 0.4 mg under the tongue every 5 (five) minutes as needed for chest pain.    . nortriptyline (PAMELOR) 10 MG capsule Take 1 capsule (10 mg total) by mouth at bedtime. 30 capsule 2  . ONE TOUCH ULTRA TEST test strip CHECK BLOOD SUGAR TWICE A DAY DX: E11.9  3  . ONETOUCH DELICA LANCETS 31D MISC 3 (three) times daily. for testing  0  . Pancrelipase,  Lip-Prot-Amyl, 24000 units CPEP Take 2 capsules (48,000 Units total) by mouth 3 (three) times daily with meals. Take 1 capsule with a snack 210 capsule 6  . pantoprazole (PROTONIX) 40 MG tablet Take 1 tablet (40 mg total) by mouth 2 (two) times daily before a meal. 180 tablet 3  . predniSONE (DELTASONE) 10 MG tablet Take 2 tablets (20 mg total) by mouth daily with breakfast for 5 days. 10 tablet 0  . Spacer/Aero-Holding Chambers (AEROCHAMBER MV) inhaler Use as instructed 1 each 0  .  traZODone (DESYREL) 50 MG tablet Take 1-2 tablets (50-100 mg total) by mouth at bedtime as needed for sleep. 180 tablet 0  . furosemide (LASIX) 40 MG tablet Take 1 tablet (40 mg total) by mouth daily. (Patient taking differently: Take 40 mg by mouth 2 (two) times daily. ) 90 tablet 3  . isosorbide mononitrate (IMDUR) 60 MG 24 hr tablet Take 1.5 tablets (90 mg total) by mouth daily. 135 tablet 3   Current Facility-Administered Medications  Medication Dose Route Frequency Provider Last Rate Last Dose  . cyanocobalamin ((VITAMIN B-12)) injection 1,000 mcg  1,000 mcg Intramuscular Q30 days Binnie Rail, MD   1,000 mcg at 03/26/16 1658    Allergies:   Amlodipine; Banana; Co q10 [coenzyme q10]; Codeine; Morphine; Pentazocine; Pravastatin; Repatha [evolocumab]; Statins; Sulfa antibiotics; Sulfonamide derivatives; and Tramadol hcl    Social History:  The patient  reports that she quit smoking about 41 years ago. Her smoking use included cigarettes. She started smoking about 62 years ago. She has a 10.50 pack-year smoking history. She has never used smokeless tobacco. She reports that she does not drink alcohol or use drugs.   Family History:  The patient's family history includes Diabetes in her maternal grandfather and mother; Emphysema in her paternal aunt and paternal uncle; Esophageal cancer (age of onset: 22) in her brother; Heart attack in her father; Heart disease in her maternal grandfather; Heart failure in her maternal grandfather; Rheum arthritis in her sister.    ROS:   Noted in current history, all other systems are negative.  Physical Exam: Blood pressure 130/66, pulse 62, height '5\' 3"'  (1.6 m), weight 196 lb 2 oz (89 kg).  GEN:  Well nourished, well developed in no acute distress HEENT: Normal NECK: No JVD; No carotid bruits LYMPHATICS: No lymphadenopathy CARDIAC:  RR  RESPIRATORY:  No wheezing ,  ABDOMEN: Soft, non-tender, non-distended MUSCULOSKELETAL:  No edema; No deformity ,  long fingernails  SKIN: Warm and dry NEUROLOGIC:  Alert and oriented x 3   EKG:     Recent Labs: 06/28/2016: Magnesium 2.0 12/22/2016: ALT 12 01/04/2017: B Natriuretic Peptide 315.0; Hemoglobin 9.3; Platelets 271 05/19/2017: BUN 15; Creatinine, Ser 0.81; Potassium 4.1; Sodium 144    Lipid Panel    Component Value Date/Time   CHOL 119 12/22/2016 0913   TRIG 71 12/22/2016 0913   HDL 43 12/22/2016 0913   CHOLHDL 2.8 12/22/2016 0913   CHOLHDL 4.4 10/06/2011 0500   VLDL 22 10/06/2011 0500   LDLCALC 62 12/22/2016 0913      Wt Readings from Last 3 Encounters:  06/24/17 196 lb 2 oz (89 kg)  06/22/17 196 lb 6.4 oz (89.1 kg)  05/14/17 193 lb (87.5 kg)      Other studies Reviewed: Additional studies/ records that were reviewed today include: . Review of the above records demonstrates:    ASSESSMENT AND PLAN:  1. Coronary artery disease-status post CABG, the saphenous vein graft to the right  coronary artery is severely diseased but she has good flow to the distal right coronary artery from the native circulation.  She is not having any episodes of angina.  Continue current medications.   3. Hypertension -blood pressure is well controlled today. 4. Hyperlipidemia 5. COPD: June, 2013 - FEV1 equals 0.89 L which is 48% of predicted value. Her DLCO is also markedly depressed. 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 He has significant shortness of breath.  She is been working with the pulmonary doctors.  They have prescribed home oxygen.   Current medicines are reviewed at length with the patient today.  The patient does not have concerns regarding medicines.  The following changes have been made:  no change  Labs/ tests ordered today include:  No orders of the defined types were placed in this encounter.    Disposition:   FU with  APP in 6 months .    Mertie Moores, MD  06/24/2017 1:57 PM    [facility]  [facility] [place], [place],   [address] Phone: [phone]; Fax: [phone]

## 2017-06-27 ENCOUNTER — Other Ambulatory Visit: Payer: Self-pay | Admitting: Gastroenterology

## 2017-06-30 ENCOUNTER — Telehealth: Payer: Self-pay | Admitting: Cardiovascular Disease

## 2017-06-30 NOTE — Telephone Encounter (Signed)
LMOM for pt.  

## 2017-06-30 NOTE — Telephone Encounter (Signed)
New Message:      Pt is calling to speak to a pharmacist pertaining to her BP.

## 2017-07-05 NOTE — Telephone Encounter (Signed)
Called pt back again. She reports she had one elevated BP reading of 167/103, but the rest of her BP readings have been lower - some in the 150s/80s. BP was well controlled at last OV with Dr Acie Fredrickson 10 days ago at 130/66. Scheduled for BP check in clinic this week and advised her to bring in home cuff to assess accuracy of BP readings. No medication changes at this time.

## 2017-07-07 ENCOUNTER — Ambulatory Visit (INDEPENDENT_AMBULATORY_CARE_PROVIDER_SITE_OTHER): Payer: Medicare Other | Admitting: Pharmacist

## 2017-07-07 VITALS — BP 142/82 | HR 86

## 2017-07-07 DIAGNOSIS — I10 Essential (primary) hypertension: Secondary | ICD-10-CM | POA: Diagnosis not present

## 2017-07-07 MED ORDER — METOPROLOL SUCCINATE ER 100 MG PO TB24: 100.0000 mg | ORAL_TABLET | Freq: Every day | ORAL | 1 refills | Status: DC

## 2017-07-07 NOTE — Progress Notes (Signed)
Patient ID: [name]                 DOB: [birth year]                      MRN: [id number]     HPI: [name] is a 77 y.o. female patient of Dr. Acie Fredrickson who presents today for hypertension evaluation, referred by Robbie Lis, PA. PMH significant for CAD s/p CABG, HTN, HLD (statin intolerance, she is followed in lipid clinic), DM, COPD-Asthma overlap syndrome, former smoker (She smoked 1/2 ppd for about 35 years, quit at age 38) GERD, OSA and chronic diastolic CHF. She has previously been seen by me for lipid management. She was started on Repatha which she did not tolerate due to muscle aching and the medication was discontinued. Previously she stopped her lisinopril as she was convinced this was causing her nose bleeds. She has since seen Dr. Acie Fredrickson and pressure was well controlled and no medication changes were made.   She reports that her gait is off and she is worried that no providers seem to be worried about this. She is not dizziness during these spells that generally last a few hours each morning. We discussed that this is unlikely to be related to her blood pressure, but she should try to check her pressures during this time to be sure that this is correct. She reports that her pressure is up and down at home (see below for range).    Current HTN meds:  Diltiazem 272m daily in the morning Furosemide 420mBID Imdur 9074maily at dinner time Metoprolol succinate 29m51mily in the evening  Previously tried: amlodipine (hallucinations), lisinopril (nose bleeds per patient)  BP goal: <130/80  Diet: She tries to eat healthy as much as possible. Eats lean proteins, avoids red meats. Eats lots of greens. Very cautious about carbohydrates because she is a diabetic. Doesn't drink sodas. Drinks juice occasionally.   Exercise: Will be starting to walk 3x a week.   Family History: The patient's family history includes Diabetes in her maternal grandfather and mother; Emphysema in her  paternal aunt and paternal uncle; Esophageal cancer (age of onset: 32) 35 her brother; Heart attack in her father; Heart disease in her maternal grandfather; Heart failure in her maternal grandfather; Rheum arthritis in her sister; Stomach cancer in her father.  Social History: The patient reports that she quit smoking about 40 years ago. Her smoking use included Cigarettes. She started smoking about 61 years ago. She has a 10.50 pack-year smoking history. She has never used smokeless tobacco. She reports that she does not drink alcohol or use drugs.  Home BP readings: 116-180/80s-110   Wt Readings from Last 3 Encounters:  06/24/17 196 lb 2 oz (89 kg)  06/22/17 196 lb 6.4 oz (89.1 kg)  05/14/17 193 lb (87.5 kg)   BP Readings from Last 3 Encounters:  07/07/17 (!) 142/82  06/24/17 130/66  06/22/17 (!) 160/100   Pulse Readings from Last 3 Encounters:  07/07/17 86  06/24/17 62  06/22/17 71    Renal function: CrCl cannot be calculated (Patient's most recent lab result is older than the maximum 21 days allowed.).  Past Medical History:  Diagnosis Date  . Adenomatous colon polyp   . Allergy   . Anxiety   . Asthma   . Chest pain   . Chronic diastolic CHF (congestive heart failure) ([facility])[place]. COPD (chronic obstructive pulmonary disease) ([facility])[place]  pt is unsure if has been officially diagnosed  . Coronary artery disease    CABG '09- cathed 12/09, 9/10, 6/11, 3/14 and 12/13/16- medical Rx  . Diabetes mellitus   . GERD (gastroesophageal reflux disease)   . Hiatal hernia   . Hyperlipidemia   . Hypertension   . Myocardial infarction ([place]) 2009  . PONV (postoperative nausea and vomiting)   . Schatzki's ring   . Shoulder injury    resolved after shoulder surgery  . Sleep apnea    not on cpap    Current Outpatient Medications on File Prior to Visit  Medication Sig Dispense Refill  . albuterol (PROVENTIL HFA;VENTOLIN HFA) 108 (90 BASE) MCG/ACT inhaler Inhale 2 puffs into the lungs  every 6 (six) hours as needed. For shortness of breath. 1 Inhaler 0  . ALPRAZolam (XANAX) 0.5 MG tablet Take 0.5 mg by mouth 2 (two) times daily.  2  . alum & mag hydroxide-simeth (MAALOX/MYLANTA) 200-200-20 MG/5ML suspension Take 30 mLs as needed by mouth for indigestion or heartburn.    Marland Kitchen aspirin EC 81 MG tablet Take 1 tablet (81 mg total) by mouth daily.    . baclofen (LIORESAL) 20 MG tablet Take 20 mg 2 (two) times daily by mouth.    . benzonatate (TESSALON) 100 MG capsule Take 100 mg by mouth 2 (two) times daily as needed for cough.     . Blood Glucose Monitoring Suppl (ONE TOUCH ULTRA MINI) w/Device KIT 3 (three) times daily. for testing  0  . budesonide-formoterol (SYMBICORT) 160-4.5 MCG/ACT inhaler Inhale 2 puffs into the lungs 2 (two) times daily.    . calcium carbonate (TUMS - DOSED IN MG ELEMENTAL CALCIUM) 500 MG chewable tablet Chew 2 tablets daily as needed by mouth for indigestion or heartburn.    . Cholecalciferol (VITAMIN D) 2000 UNITS tablet Take 2,000 Units by mouth daily.     . clonazePAM (KLONOPIN) 0.5 MG tablet Take 0.5 mg daily by mouth.    . clopidogrel (PLAVIX) 75 MG tablet Take 1 tablet (75 mg total) by mouth daily. 90 tablet 3  . CREON 24000-76000 units CPEP TAKE 2 CAPSULES BY MOUTH 3 TIMES A DAY WITH MEALS (TAK 1 CAPSULE WITH A SNACK) 210 capsule 1  . Cyanocobalamin (B-12 COMPLIANCE INJECTION) 1000 MCG/ML KIT Inject 1,000 mcg every 30 (thirty) days as directed.    . cyclobenzaprine (FLEXERIL) 5 MG tablet TAKE 1 TABLET BY MOUTH AT BEDTIME 30 tablet 0  . diclofenac sodium (VOLTAREN) 1 % GEL Apply 2 g topically 4 (four) times daily as needed (muscle pain).    Marland Kitchen diltiazem (CARDIZEM CD) 240 MG 24 hr capsule Take 1 capsule (240 mg total) by mouth daily. 30 capsule 10  . ezetimibe (ZETIA) 10 MG tablet Take 10 mg by mouth daily.  6  . fexofenadine (ALLEGRA) 180 MG tablet Take 1 tablet (180 mg total) by mouth daily. 30 tablet 6  . fluticasone (FLONASE) 50 MCG/ACT nasal spray  Place 2 sprays into both nostrils daily. 16 g 6  . Fluticasone-Umeclidin-Vilant (TRELEGY ELLIPTA) 100-62.5-25 MCG/INH AEPB Inhale 1 puff into the lungs daily. 1 each 0  . furosemide (LASIX) 40 MG tablet Take 1 tablet (40 mg total) by mouth daily. (Patient taking differently: Take 40 mg by mouth 2 (two) times daily. ) 90 tablet 3  . insulin lispro protamine-insulin lispro (HUMALOG 75/25) (75-25) 100 UNIT/ML SUSP Inject 20-30 Units into the skin See admin instructions. Takes 20 units in the morning and 30  units with  supper    . isosorbide mononitrate (IMDUR) 60 MG 24 hr tablet Take 1.5 tablets (90 mg total) by mouth daily. 135 tablet 3  . loperamide (IMODIUM) 2 MG capsule Take 2 mg by mouth as needed for diarrhea or loose stools.     . meclizine (ANTIVERT) 25 MG tablet Take 25 mg by mouth 2 (two) times daily as needed for dizziness.     . methocarbamol (ROBAXIN) 500 MG tablet Take 500 mg 2 (two) times daily by mouth.    . montelukast (SINGULAIR) 5 MG chewable tablet Chew 1 tablet (5 mg total) by mouth at bedtime. 30 tablet 3  . Multiple Vitamin (MULTIVITAMIN WITH MINERALS) TABS Take 1 tablet by mouth daily.    . Nebulizers (COMPRESSOR/NEBULIZER) MISC Use with albuterol 1 each 0  . nitroGLYCERIN (NITROSTAT) 0.4 MG SL tablet Place 0.4 mg under the tongue every 5 (five) minutes as needed for chest pain.    . nortriptyline (PAMELOR) 10 MG capsule Take 1 capsule (10 mg total) by mouth at bedtime. 30 capsule 2  . ONE TOUCH ULTRA TEST test strip CHECK BLOOD SUGAR TWICE A DAY DX: E11.9  3  . ONETOUCH DELICA LANCETS 43X MISC 3 (three) times daily. for testing  0  . pantoprazole (PROTONIX) 40 MG tablet Take 1 tablet (40 mg total) by mouth 2 (two) times daily before a meal. 180 tablet 3  . Spacer/Aero-Holding Chambers (AEROCHAMBER MV) inhaler Use as instructed 1 each 0  . traZODone (DESYREL) 50 MG tablet Take 1-2 tablets (50-100 mg total) by mouth at bedtime as needed for sleep. 180 tablet 0   Current  Facility-Administered Medications on File Prior to Visit  Medication Dose Route Frequency Provider Last Rate Last Dose  . cyanocobalamin ((VITAMIN B-12)) injection 1,000 mcg  1,000 mcg Intramuscular Q30 days Binnie Rail, MD   1,000 mcg at 03/26/16 1658    Allergies  Allergen Reactions  . Amlodipine Other (See Comments)    hallucinations   . Banana Nausea And Vomiting    Stomach pumped  . Co Q10 [Coenzyme Q10]     Body cramps  . Codeine     Hallucinate, loose identity and don't know who I am  . Morphine Other (See Comments)    Can not function, it immobilizes me   . Pentazocine Nausea And Vomiting  . Pravastatin     Hands locked up  . Repatha [Evolocumab]     myalgias  . Statins     Muscle cramps  . Sulfa Antibiotics Swelling  . Sulfonamide Derivatives Swelling  . Tramadol Hcl     Blood pressure (!) 142/82, pulse 86.  Her cuff: 137/83   Assessment/Plan: Hypertension: Her pressure today is again only very slightly above goal. Will increase her metoprolol to 154m daily since she is tolerating this well. Have encouraged her to follow up with PCP if her pressure is normal during episodes of poor gait. Follow up in HTN clinic in 4 weeks.   Thank you, KLelan Pons APatterson Hammersmith PAppleby

## 2017-07-07 NOTE — Patient Instructions (Signed)
Increase metoprolol succinate to 100mg  daily (you may take 2 tablets of your current supply until you run out)  Continue to monitor pressures and bring log with you to follow up in 3-4 weeks.

## 2017-07-11 ENCOUNTER — Ambulatory Visit: Payer: Medicare Other | Admitting: Neurology

## 2017-07-20 ENCOUNTER — Encounter: Payer: Self-pay | Admitting: Neurology

## 2017-07-20 ENCOUNTER — Other Ambulatory Visit: Payer: Self-pay

## 2017-07-20 ENCOUNTER — Ambulatory Visit (INDEPENDENT_AMBULATORY_CARE_PROVIDER_SITE_OTHER): Payer: Medicare Other | Admitting: Neurology

## 2017-07-20 VITALS — BP 146/76 | HR 62 | Resp 16 | Ht 63.0 in | Wt 197.0 lb

## 2017-07-20 DIAGNOSIS — G43819 Other migraine, intractable, without status migrainosus: Secondary | ICD-10-CM | POA: Diagnosis not present

## 2017-07-20 DIAGNOSIS — E1142 Type 2 diabetes mellitus with diabetic polyneuropathy: Secondary | ICD-10-CM | POA: Diagnosis not present

## 2017-07-20 DIAGNOSIS — R2681 Unsteadiness on feet: Secondary | ICD-10-CM | POA: Diagnosis not present

## 2017-07-20 MED ORDER — NORTRIPTYLINE HCL 25 MG PO CAPS: 25.0000 mg | ORAL_CAPSULE | Freq: Every day | ORAL | 3 refills | Status: DC

## 2017-07-20 NOTE — Patient Instructions (Addendum)
1.  Increase nortriptyline to 25mg  at bedtime.  If headaches not improved in 4 weeks, contact me and I will increase dose 2.  Limit Tylenol to no more than 2 days out of week 3.  We will refer you to physical therapy for unsteadiness and falls 4.  Follow up in 3 months.

## 2017-07-20 NOTE — Progress Notes (Signed)
NEUROLOGY FOLLOW UP OFFICE NOTE  [name] [id number]  HISTORY OF PRESENT ILLNESS: [name] is a 77 year old female with CAD, HTN, type 2 diabetes, HLD, B12 deficiency and OSA who follows up for cervicogenic migraine.  UPDATE: She was started on nortriptyline last visit.  She notes no change. Current NSAIDS:  ASA 101m Current analgesics:  Tylenol (takes daily) Current triptans:  no Current anti-emetic:  promethazine 228mCurrent muscle relaxants:  baclofen 2019mwice daily, Robaxin 500m60mlexeril Current anti-anxiolytic:  Klonopin Current sleep aide:  trazodone Current Antihypertensive medications:  Toprol XL 50mg53mrdizem, Lasix, Imdur Current Antidepressant medications:  nortriptyline 10mg 42ment Anticonvulsant medications:  no Current Vitamins/Herbal/Supplements:  B12 Current Antihistamines/Decongestants:  Allegra, Singulair Other therapy:  No  She is also concerned about her balance.  She feels more unsteady on her feet.  She did have a fall a few months ago.   Caffeine:  1 cup coffee daily, tea Alcohol:  no Smoker:  no Diet:  hydrates Exercise:  yes Depression/anxiety:  ok Sleep hygiene:  She has untreated OSA.  HISTORY:   Onset:  She has history of migraines as a young woman which resolved after having children.  Current headaches started in 2016. Location:  She has constant holocephalic headache.  She has severe headache radiating from the base of her neck on the right up Quality:  pounding Initial Intensity:  10/10 Aura:  no Prodrome:  no Postdrome:  no Associated symptoms:  Nausea, phonophobia, dizziness.  No vomiting, photophobia or visual disturbance.  She has not had any new worse headache of her life, waking up from sleep Initial Duration:  All day Initial Frequency:  Daily headache, severe  Headache every other day Initial Frequency of abortive medication: daily Triggers/exacerbating factors:  no Relieving factors:  no Activity:   aggravates   Past NSAIDS:  Ibuprofen, naproxen (caused increased heart rate) Past analgesics:  Excedrin, tramadol and other opioids (adverse reaction) Past abortive triptans:  no Past muscle relaxants:  no Past anti-emetic:  Promethazine (effective) Past antihypertensive medications:  losartan Past antidepressant medications:  no Past anticonvulsant medications:  Gabapentin 200mg (27msiness) Past vitamins/Herbal/Supplements:  CoQ10 Past antihistamines/decongestants:  no Other past therapy:  Physical therapy of neck.   Family history of headache:  father   MRI of brain without contrast from 09/16/14 was personally reviewed and revealed partially empty sella but otherwise unremarkable.   MRI of cervical spine from 09/16/14 was personally reviewed and demonstrated multilevel degenerative changes including left greater than right spurring at C3-C4 causing left foraminal narrowing, severe spurring at C4-C5 causing right moderate foraminal narrowing, and disc bulg and spurring at C6-C7 with moderate bilateral foraminal narrowing.  She was evaluated by Dr. Ramos fNelva Bushterventional pain (epidural or facet joint injections).  He didn't think it was a good idea given that she would have to stop Plavix.  PAST MEDICAL HISTORY: Past Medical History:  Diagnosis Date  . Adenomatous colon polyp   . Allergy   . Anxiety   . Asthma   . Chest pain   . Chronic diastolic CHF (congestive heart failure) (HCC)   [place]   [place] is unsure if has been officially diagnosed  . Coronary artery disease    [facility]ABG '09- cathed 12/09, 9/10, 6/11, 3/14 and 12/13/16- medical Rx  . Diabetes mellitus   . GERD (gastroesophageal reflux disease)   . Hiatal hernia   . Hyperlipidemia   . Hypertension   .  Myocardial infarction ([place]) 2009  . PONV (postoperative nausea and vomiting)   . Schatzki's ring   . Shoulder injury    resolved after shoulder surgery  . Sleep apnea    not on  cpap    MEDICATIONS: Current Outpatient Medications on File Prior to Visit  Medication Sig Dispense Refill  . albuterol (PROVENTIL HFA;VENTOLIN HFA) 108 (90 BASE) MCG/ACT inhaler Inhale 2 puffs into the lungs every 6 (six) hours as needed. For shortness of breath. 1 Inhaler 0  . ALPRAZolam (XANAX) 0.5 MG tablet Take 0.5 mg by mouth 2 (two) times daily.  2  . alum & mag hydroxide-simeth (MAALOX/MYLANTA) 200-200-20 MG/5ML suspension Take 30 mLs as needed by mouth for indigestion or heartburn.    Marland Kitchen aspirin EC 81 MG tablet Take 1 tablet (81 mg total) by mouth daily.    . baclofen (LIORESAL) 20 MG tablet Take 20 mg 2 (two) times daily by mouth.    . benzonatate (TESSALON) 100 MG capsule Take 100 mg by mouth 2 (two) times daily as needed for cough.     . Blood Glucose Monitoring Suppl (ONE TOUCH ULTRA MINI) w/Device KIT 3 (three) times daily. for testing  0  . budesonide-formoterol (SYMBICORT) 160-4.5 MCG/ACT inhaler Inhale 2 puffs into the lungs 2 (two) times daily.    . calcium carbonate (TUMS - DOSED IN MG ELEMENTAL CALCIUM) 500 MG chewable tablet Chew 2 tablets daily as needed by mouth for indigestion or heartburn.    . Cholecalciferol (VITAMIN D) 2000 UNITS tablet Take 2,000 Units by mouth daily.     . clonazePAM (KLONOPIN) 0.5 MG tablet Take 0.5 mg daily by mouth.    . clopidogrel (PLAVIX) 75 MG tablet Take 1 tablet (75 mg total) by mouth daily. 90 tablet 3  . CREON 24000-76000 units CPEP TAKE 2 CAPSULES BY MOUTH 3 TIMES A DAY WITH MEALS (TAK 1 CAPSULE WITH A SNACK) 210 capsule 1  . Cyanocobalamin (B-12 COMPLIANCE INJECTION) 1000 MCG/ML KIT Inject 1,000 mcg every 30 (thirty) days as directed.    . diclofenac sodium (VOLTAREN) 1 % GEL Apply 2 g topically 4 (four) times daily as needed (muscle pain).    Marland Kitchen diltiazem (CARDIZEM CD) 240 MG 24 hr capsule Take 1 capsule (240 mg total) by mouth daily. 30 capsule 10  . ezetimibe (ZETIA) 10 MG tablet Take 10 mg by mouth daily.  6  . fexofenadine  (ALLEGRA) 180 MG tablet Take 1 tablet (180 mg total) by mouth daily. 30 tablet 6  . fluticasone (FLONASE) 50 MCG/ACT nasal spray Place 2 sprays into both nostrils daily. 16 g 6  . Fluticasone-Umeclidin-Vilant (TRELEGY ELLIPTA) 100-62.5-25 MCG/INH AEPB Inhale 1 puff into the lungs daily. 1 each 0  . insulin lispro protamine-insulin lispro (HUMALOG 75/25) (75-25) 100 UNIT/ML SUSP Inject 20-30 Units into the skin See admin instructions. Takes 20 units in the morning and 30  units with supper    . loperamide (IMODIUM) 2 MG capsule Take 2 mg by mouth as needed for diarrhea or loose stools.     . meclizine (ANTIVERT) 25 MG tablet Take 25 mg by mouth 2 (two) times daily as needed for dizziness.     . methocarbamol (ROBAXIN) 500 MG tablet Take 500 mg 2 (two) times daily by mouth.    . metoprolol succinate (TOPROL-XL) 100 MG 24 hr tablet Take 1 tablet (100 mg total) by mouth daily. 90 tablet 1  . montelukast (SINGULAIR) 5 MG chewable tablet Chew 1 tablet (5 mg total)  by mouth at bedtime. 30 tablet 3  . Multiple Vitamin (MULTIVITAMIN WITH MINERALS) TABS Take 1 tablet by mouth daily.    . Nebulizers (COMPRESSOR/NEBULIZER) MISC Use with albuterol 1 each 0  . nitroGLYCERIN (NITROSTAT) 0.4 MG SL tablet Place 0.4 mg under the tongue every 5 (five) minutes as needed for chest pain.    . ONE TOUCH ULTRA TEST test strip CHECK BLOOD SUGAR TWICE A DAY DX: E11.9  3  . ONETOUCH DELICA LANCETS 37T MISC 3 (three) times daily. for testing  0  . pantoprazole (PROTONIX) 40 MG tablet Take 1 tablet (40 mg total) by mouth 2 (two) times daily before a meal. 180 tablet 3  . Spacer/Aero-Holding Chambers (AEROCHAMBER MV) inhaler Use as instructed 1 each 0  . traZODone (DESYREL) 50 MG tablet Take 1-2 tablets (50-100 mg total) by mouth at bedtime as needed for sleep. 180 tablet 0  . cyclobenzaprine (FLEXERIL) 5 MG tablet TAKE 1 TABLET BY MOUTH AT BEDTIME (Patient not taking: Reported on 07/20/2017) 30 tablet 0  . furosemide (LASIX)  40 MG tablet Take 1 tablet (40 mg total) by mouth daily. (Patient taking differently: Take 40 mg by mouth 2 (two) times daily. ) 90 tablet 3  . isosorbide mononitrate (IMDUR) 60 MG 24 hr tablet Take 1.5 tablets (90 mg total) by mouth daily. 135 tablet 3   Current Facility-Administered Medications on File Prior to Visit  Medication Dose Route Frequency Provider Last Rate Last Dose  . cyanocobalamin ((VITAMIN B-12)) injection 1,000 mcg  1,000 mcg Intramuscular Q30 days Binnie Rail, MD   1,000 mcg at 03/26/16 1658    ALLERGIES: Allergies  Allergen Reactions  . Amlodipine Other (See Comments)    hallucinations   . Banana Nausea And Vomiting    Stomach pumped  . Co Q10 [Coenzyme Q10]     Body cramps  . Codeine     Hallucinate, loose identity and don't know who I am  . Morphine Other (See Comments)    Can not function, it immobilizes me   . Pentazocine Nausea And Vomiting  . Pravastatin     Hands locked up  . Repatha [Evolocumab]     myalgias  . Statins     Muscle cramps  . Sulfa Antibiotics Swelling  . Sulfonamide Derivatives Swelling  . Tramadol Hcl     FAMILY HISTORY: Family History  Problem Relation Age of Onset  . Heart disease Maternal Grandfather   . Heart failure Maternal Grandfather   . Diabetes Maternal Grandfather   . Heart attack Father   . Diabetes Mother   . Rheum arthritis Sister   . Emphysema Paternal Uncle   . Esophageal cancer Brother 17       she said he was born with it  . Emphysema Paternal Aunt   . Neuropathy Neg Hx   . Multiple sclerosis Neg Hx   . Colon cancer Neg Hx   . Colon polyps Neg Hx   . Rectal cancer Neg Hx   . Stomach cancer Neg Hx     SOCIAL HISTORY: Social History   Socioeconomic History  . Marital status: Widowed    Spouse name: Not on file  . Number of children: 4  . Years of education: Designer, jewellery  . Highest education level: Not on file  Occupational History  . Occupation: Retired  Scientific laboratory technician  . Financial resource  strain: Not hard at all  . Food insecurity:    Worry: Never true    Inability:  Never true  . Transportation needs:    Medical: No    Non-medical: No  Tobacco Use  . Smoking status: Former Smoker    Packs/day: 0.50    Years: 21.00    Pack years: 10.50    Types: Cigarettes    Start date: 02/09/1955    Last attempt to quit: 02/09/1976    Years since quitting: 41.4  . Smokeless tobacco: Never Used  Substance and Sexual Activity  . Alcohol use: No    Alcohol/week: 0.0 oz  . Drug use: No  . Sexual activity: Never  Lifestyle  . Physical activity:    Days per week: 2 days    Minutes per session: 40 min  . Stress: Very much  Relationships  . Social connections:    Talks on phone: More than three times a week    Gets together: More than three times a week    Attends religious service: More than 4 times per year    Active member of club or organization: Not on file    Attends meetings of clubs or organizations: More than 4 times per year    Relationship status: Widowed  . Intimate partner violence:    Fear of current or ex partner: Not on file    Emotionally abused: Not on file    Physically abused: Not on file    Forced sexual activity: Not on file  Other Topics Concern  . Not on file  Social History Narrative   Lives alone.  One story home.  Has 4 children.  Education: doctorate in theology.    Caffeine use: Drinks 1 cup coffee/day      Originally from [place]. Previously has lived in [place]. Prior travel to [place], [place], [place], [place], [place], [place], [place], & [place]. Previously worked in [employer] She has a dog currently. No bird, mold, or hot tub exposure. She also pastors a church.     REVIEW OF SYSTEMS: Constitutional: No fevers, chills, or sweats, no generalized fatigue, change in appetite Eyes: No visual changes, double vision, eye pain Ear, nose and throat: No hearing loss, ear pain, nasal congestion, sore throat Cardiovascular: No chest pain,  palpitations Respiratory:  No shortness of breath at rest or with exertion, wheezes GastrointestinaI: No nausea, vomiting, diarrhea, abdominal pain, fecal incontinence Genitourinary:  No dysuria, urinary retention or frequency Musculoskeletal:  No neck pain, back pain Integumentary: No rash, pruritus, skin lesions Neurological: as above Psychiatric: No depression, insomnia, anxiety Endocrine: No palpitations, fatigue, diaphoresis, mood swings, change in appetite, change in weight, increased thirst Hematologic/Lymphatic:  No purpura, petechiae. Allergic/Immunologic: no itchy/runny eyes, nasal congestion, recent allergic reactions, rashes  PHYSICAL EXAM: Vitals:   07/20/17 1355  BP: (!) 146/76  Pulse: 62  Resp: 16   General: No acute distress.  Patient appears well-groomed.   Head:  Normocephalic/atraumatic Eyes:  Fundi examined but not visualized Neck: supple, no paraspinal tenderness, full range of motion Heart:  Regular rate and rhythm Lungs:  Clear to auscultation bilaterally Back: No paraspinal tenderness Neurological Exam: alert and oriented to person, place, and time. Attention span and concentration intact, recent and remote memory intact, fund of knowledge intact.  Speech fluent and not dysarthric, language intact.  CN II-XII intact. Bulk and tone normal, muscle strength 5/5 throughout.  Sensation to light touch, temperature and vibration reduced in feet up to shins  Deep tendon reflexes 2+ throughout, toes downgoing.  Finger to nose and heel to shin testing intact.  Gait normal,  Romberg negative.  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  Increase nortriptyline to 58m at bedtime.  If headaches not improved in 4 weeks, contact me and I will increase dose 2.  Limit Tylenol to no more than 2 days out of week 3.  We will refer to physical therapy for unsteadiness and  falls 4.  Follow up in 3 months.  AMetta Clines DO  CC: Dr. BQuay Burow

## 2017-07-22 ENCOUNTER — Other Ambulatory Visit: Payer: Self-pay | Admitting: Internal Medicine

## 2017-07-25 NOTE — Progress Notes (Signed)
Subjective:    Patient ID: [name], female    DOB: February 16, 1940, 77 y.o.   MRN: [id number]  HPI   Medications and allergies reviewed with patient and updated if appropriate.  Patient Active Problem List   Diagnosis Date Noted  . COPD with asthma ([place]) 05/04/2017  . Angina pectoris ([place]) 01/05/2017  . Insomnia 11/23/2016  . Chronic nonintractable headache 11/23/2016  . Pancreatic insufficiency 10/28/2016  . Carotid artery disease ([place]) 05/19/2016  . Fatigue 05/19/2016  . Anemia 03/27/2016  . Severe persistent asthma 03/18/2016  . Type 1 diabetes mellitus ([place]) 07/21/2015  . B12 deficiency 03/31/2015  . OSA (obstructive sleep apnea) 11/20/2014  . Fecal incontinence 08/28/2014  . 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 Falls 08/28/2014  . Neck pain of over 3 months duration 08/28/2014  . H/O total knee replacement 02/20/2014  . Obesity (BMI 30-39.9) 11/12/2013  . Chronic diastolic CHF (congestive heart failure) ([place]) 11/17/2012  . Meniscus, lateral, anterior horn derangement 11/10/2011  . Medial meniscus, posterior horn derangement 11/10/2011  . Osteoarthritis of right knee 11/10/2011  . Vertigo 10/05/2011  . HTN (hypertension), benign 10/05/2011  . Coronary artery disease 08/27/2010  . MUSCLE CRAMPS 12/29/2009  . History of myocardial infarction 11/19/2009  . Extrinsic asthma 11/19/2009  . GERD 11/19/2009  . Cough 11/19/2009  . Hyperlipidemia 11/18/2009    Current Outpatient Medications on File Prior to Visit  Medication Sig Dispense Refill  . albuterol (PROVENTIL HFA;VENTOLIN HFA) 108 (90 BASE) MCG/ACT inhaler Inhale 2 puffs into the lungs every 6 (six) hours as needed. For shortness of breath. 1 Inhaler 0  . ALPRAZolam (XANAX) 0.5 MG tablet Take 0.5 mg by mouth 2 (two) times daily.  2  . alum & mag hydroxide-simeth (MAALOX/MYLANTA) 200-200-20 MG/5ML suspension Take 30 mLs as needed by mouth for indigestion or heartburn.    Marland Kitchen aspirin EC 81 MG tablet Take 1  tablet (81 mg total) by mouth daily.    . baclofen (LIORESAL) 20 MG tablet Take 20 mg 2 (two) times daily by mouth.    . benzonatate (TESSALON) 100 MG capsule Take 100 mg by mouth 2 (two) times daily as needed for cough.     . Blood Glucose Monitoring Suppl (ONE TOUCH ULTRA MINI) w/Device KIT 3 (three) times daily. for testing  0  . budesonide-formoterol (SYMBICORT) 160-4.5 MCG/ACT inhaler Inhale 2 puffs into the lungs 2 (two) times daily.    . calcium carbonate (TUMS - DOSED IN MG ELEMENTAL CALCIUM) 500 MG chewable tablet Chew 2 tablets daily as needed by mouth for indigestion or heartburn.    . Cholecalciferol (VITAMIN D) 2000 UNITS tablet Take 2,000 Units by mouth daily.     . clonazePAM (KLONOPIN) 0.5 MG tablet Take 0.5 mg daily by mouth.    . clopidogrel (PLAVIX) 75 MG tablet Take 1 tablet (75 mg total) by mouth daily. 90 tablet 3  . CREON 24000-76000 units CPEP TAKE 2 CAPSULES BY MOUTH 3 TIMES A DAY WITH MEALS (TAK 1 CAPSULE WITH A SNACK) 210 capsule 1  . Cyanocobalamin (B-12 COMPLIANCE INJECTION) 1000 MCG/ML KIT Inject 1,000 mcg every 30 (thirty) days as directed.    . cyclobenzaprine (FLEXERIL) 5 MG tablet TAKE 1 TABLET BY MOUTH AT BEDTIME (Patient not taking: Reported on 07/20/2017) 30 tablet 0  . diclofenac sodium (VOLTAREN) 1 % GEL Apply 2 g topically 4 (four) times daily as needed (muscle pain).    Marland Kitchen diltiazem (CARDIZEM CD) 240 MG 24 hr capsule Take  1 capsule (240 mg total) by mouth daily. 30 capsule 10  . ezetimibe (ZETIA) 10 MG tablet Take 10 mg by mouth daily.  6  . fexofenadine (ALLEGRA) 180 MG tablet Take 1 tablet (180 mg total) by mouth daily. 30 tablet 6  . fluticasone (FLONASE) 50 MCG/ACT nasal spray Place 2 sprays into both nostrils daily. 16 g 6  . Fluticasone-Umeclidin-Vilant (TRELEGY ELLIPTA) 100-62.5-25 MCG/INH AEPB Inhale 1 puff into the lungs daily. 1 each 0  . furosemide (LASIX) 40 MG tablet Take 1 tablet (40 mg total) by mouth daily. (Patient taking differently: Take 40  mg by mouth 2 (two) times daily. ) 90 tablet 3  . insulin lispro protamine-insulin lispro (HUMALOG 75/25) (75-25) 100 UNIT/ML SUSP Inject 20-30 Units into the skin See admin instructions. Takes 20 units in the morning and 30  units with supper    . isosorbide mononitrate (IMDUR) 60 MG 24 hr tablet Take 1.5 tablets (90 mg total) by mouth daily. 135 tablet 3  . loperamide (IMODIUM) 2 MG capsule Take 2 mg by mouth as needed for diarrhea or loose stools.     . meclizine (ANTIVERT) 25 MG tablet Take 25 mg by mouth 2 (two) times daily as needed for dizziness.     . methocarbamol (ROBAXIN) 500 MG tablet Take 500 mg 2 (two) times daily by mouth.    . metoprolol succinate (TOPROL-XL) 100 MG 24 hr tablet Take 1 tablet (100 mg total) by mouth daily. 90 tablet 1  . montelukast (SINGULAIR) 5 MG chewable tablet Chew 1 tablet (5 mg total) by mouth at bedtime. 30 tablet 3  . Multiple Vitamin (MULTIVITAMIN WITH MINERALS) TABS Take 1 tablet by mouth daily.    . Nebulizers (COMPRESSOR/NEBULIZER) MISC Use with albuterol 1 each 0  . nitroGLYCERIN (NITROSTAT) 0.4 MG SL tablet Place 0.4 mg under the tongue every 5 (five) minutes as needed for chest pain.    . nortriptyline (PAMELOR) 25 MG capsule Take 1 capsule (25 mg total) by mouth at bedtime. 30 capsule 3  . ONE TOUCH ULTRA TEST test strip CHECK BLOOD SUGAR TWICE A DAY DX: E11.9  3  . ONETOUCH DELICA LANCETS 16P MISC 3 (three) times daily. for testing  0  . pantoprazole (PROTONIX) 40 MG tablet Take 1 tablet (40 mg total) by mouth 2 (two) times daily before a meal. 180 tablet 3  . Spacer/Aero-Holding Chambers (AEROCHAMBER MV) inhaler Use as instructed 1 each 0  . traZODone (DESYREL) 50 MG tablet TAKE 1-2 TABLETS BY MOUTH AT BEDTIME AS NEEDED FOR SLEEP. 180 tablet 0   Current Facility-Administered Medications on File Prior to Visit  Medication Dose Route Frequency Provider Last Rate Last Dose  . cyanocobalamin ((VITAMIN B-12)) injection 1,000 mcg  1,000 mcg  Intramuscular Q30 days Binnie Rail, MD   1,000 mcg at 03/26/16 1658    Past Medical History:  Diagnosis Date  . Adenomatous colon polyp   . Allergy   . Anxiety   . Asthma   . Chest pain   . Chronic diastolic CHF (congestive heart failure) ([place])   . COPD (chronic obstructive pulmonary disease) ([place])    pt is unsure if has been officially diagnosed  . Coronary artery disease    CABG '09- cathed 12/09, 9/10, 6/11, 3/14 and 12/13/16- medical Rx  . Diabetes mellitus   . GERD (gastroesophageal reflux disease)   . Hiatal hernia   . Hyperlipidemia   . Hypertension   . Myocardial infarction ([place]) 2009  .  PONV (postoperative nausea and vomiting)   . Schatzki's ring   . Shoulder injury    resolved after shoulder surgery  . Sleep apnea    not on cpap    Past Surgical History:  Procedure Laterality Date  . ABDOMINAL HYSTERECTOMY    . APPENDECTOMY     came out with Hysterectomy  . CARDIAC CATHETERIZATION  07/23/2009   EF 60%  . CARDIAC CATHETERIZATION  10/11/2008  . CARDIAC CATHETERIZATION  03/01/2007   EF 75-80%  . CARDIOVASCULAR STRESS TEST  11/15/2007   EF 60%  . COLONOSCOPY    . CORONARY ARTERY BYPASS GRAFT     SEVERELY DISEASED SAPHENOUS VEIN GRAFT TO THE RIGHT CORONARY ARTERY BUT WITH FAIRLY WELL PRESERVED FLOW TO THE DISTAL RIGHT CORONARY ARTERY FROM THE NATIVE CIRCULATION-RESTART  CATH IN JUNE 2000, REVEALS MILD/MODERATE  CAD WITH GOOD FLOW DOWN HER LAD  . ESOPHAGOGASTRODUODENOSCOPY    . EYE SURGERY     bilateral cataract surgery with lens implant  . LEFT HEART CATH AND CORS/GRAFTS ANGIOGRAPHY N/A 12/14/2016   Procedure: LEFT HEART CATH AND CORS/GRAFTS ANGIOGRAPHY;  Surgeon: Jettie Booze, MD;  Location: [facility];  Service: Cardiovascular;  Laterality: N/A;  . lense removal Left   . POLYPECTOMY    . ROTATOR CUFF REPAIR     right and left  . TONSILLECTOMY     age 25  . TOTAL KNEE ARTHROPLASTY Right 02/20/2014   Procedure: RIGHT TOTAL KNEE ARTHROPLASTY;   Surgeon: Tobi Bastos, MD;  Location: [facility];  Service: Orthopedics;  Laterality: Right;  . tumor removed kidney    . UPPER GASTROINTESTINAL ENDOSCOPY    . US ECHOCARDIOGRAPHY  03/08/2008   EF 55-60%    Social History   Socioeconomic History  . Marital status: Widowed    Spouse name: Not on file  . Number of children: 4  . Years of education: Designer, jewellery  . Highest education level: Not on file  Occupational History  . Occupation: Retired  Scientific laboratory technician  . Financial resource strain: Not hard at all  . Food insecurity:    Worry: Never true    Inability: Never true  . Transportation needs:    Medical: No    Non-medical: No  Tobacco Use  . Smoking status: Former Smoker    Packs/day: 0.50    Years: 21.00    Pack years: 10.50    Types: Cigarettes    Start date: 02/09/1955    Last attempt to quit: 02/09/1976    Years since quitting: 41.4  . Smokeless tobacco: Never Used  Substance and Sexual Activity  . Alcohol use: No    Alcohol/week: 0.0 oz  . Drug use: No  . Sexual activity: Never  Lifestyle  . Physical activity:    Days per week: 2 days    Minutes per session: 40 min  . Stress: Very much  Relationships  . Social connections:    Talks on phone: More than three times a week    Gets together: More than three times a week    Attends religious service: More than 4 times per year    Active member of club or organization: Not on file    Attends meetings of clubs or organizations: More than 4 times per year    Relationship status: Widowed  Other Topics Concern  . Not on file  Social History Narrative   Lives alone.  One story home.  Has 4 children.  Education: doctorate in theology.  Caffeine use: Drinks 1 cup coffee/day      Originally from [place]. Previously has lived in [place]. Prior travel to [place], [place], [place], [place], [place], [place], [place], & [place]. Previously worked in [employer] She has a dog currently. No bird, mold, or hot tub exposure. She also pastors a  church.     Family History  Problem Relation Age of Onset  . Heart disease Maternal Grandfather   . Heart failure Maternal Grandfather   . Diabetes Maternal Grandfather   . Heart attack Father   . Diabetes Mother   . Rheum arthritis Sister   . Emphysema Paternal Uncle   . Esophageal cancer Brother 56       she said he was born with it  . Emphysema Paternal Aunt   . Neuropathy Neg Hx   . Multiple sclerosis Neg Hx   . Colon cancer Neg Hx   . Colon polyps Neg Hx   . Rectal cancer Neg Hx   . Stomach cancer Neg Hx     Review of Systems     Objective:  There were no vitals filed for this visit. BP Readings from Last 3 Encounters:  07/20/17 (!) 146/76  07/07/17 (!) 142/82  06/24/17 130/66   Wt Readings from Last 3 Encounters:  07/20/17 197 lb (89.4 kg)  06/24/17 196 lb 2 oz (89 kg)  06/22/17 196 lb 6.4 oz (89.1 kg)   There is no height or weight on file to calculate BMI.   Physical Exam         Assessment & Plan:    See Problem List for Assessment and Plan of chronic medical problems.   This encounter was created in error - please disregard.

## 2017-07-26 ENCOUNTER — Encounter: Payer: Medicare Other | Admitting: Internal Medicine

## 2017-07-26 NOTE — Progress Notes (Signed)
Initiated on cover my meds key: [id number]  Will receive determination with 72 hrs.

## 2017-07-27 NOTE — Progress Notes (Signed)
Rcvd approval for nortriptyline 25 mg caps. [id number] valid through 02/07/18  Called [facility], spoke with Mitzi Hansen

## 2017-07-31 NOTE — Patient Instructions (Addendum)
  Test(s) ordered today. Your results will be released to [facility] (or called to you) after review, usually within 72hours after test completion. If any changes need to be made, you will be notified at that same time.   Medications reviewed and updated.  Changes include stopping trazodone.  Try belsomra at night for sleep.  Add pepcid 40 mg in the evening for heartburn.    Your prescription(s) have been submitted to your pharmacy. Please take as directed and contact our office if you believe you are having problem(s) with the medication(s).   Please followup in 6 months

## 2017-07-31 NOTE — Progress Notes (Signed)
Subjective:    Patient ID: [name], female    DOB: [birth year], 77 y.o.   MRN: [id number]  HPI The patient is here for follow up.  Joint pain:  She has had joint pain for a while and she thought it was arthritis.  It has gotten worse.   Her elbows, hips, wrists hurt.  They just ache.  She had a right knee replacement.  The left knee is ok.  The right elbow sets swollen at times.  She takes tylenol for the pain and it does help.      Diabetes: She is following with Dr Chalmers Cater.  She is taking her medication daily as prescribed. She is compliant with a diabetic diet. She is exercising regularly - walking. She monitors her sugars and they have been running 94-180.    Hypertension: She is taking her medication daily. She is compliant with a low sodium diet.  She denies shortness of breath. She is exercising regularly.      Hyperlipidemia: She is taking her medication daily. She is compliant with a low fat/cholesterol diet. She is exercising regularly. She denies myalgias.   GERD:  She is taking her medication twice daily as prescribed.  She is having GERD symptoms every other day.     Insomnia:  She is taking trazodone nightly along with the nortriptyline. If she gets 3 hours of sleep she feels blessed.       B12 def:  She is due for B12 injection.  Her last B12 injection was 2-3 months ago.     Medications and allergies reviewed with patient and updated if appropriate.  Patient Active Problem List   Diagnosis Date Noted  . COPD with asthma ([place]) 05/04/2017  . Angina pectoris ([place]) 01/05/2017  . Insomnia 11/23/2016  . Chronic nonintractable headache 11/23/2016  . Pancreatic insufficiency 10/28/2016  . Carotid artery disease ([place]) 05/19/2016  . Fatigue 05/19/2016  . Anemia 03/27/2016  . Severe persistent asthma 03/18/2016  . Type 1 diabetes mellitus ([place]) 07/21/2015  . B12 deficiency 03/31/2015  . OSA (obstructive sleep apnea) 11/20/2014  . Fecal incontinence 08/28/2014  .  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 Falls 08/28/2014  . Neck pain of over 3 months duration 08/28/2014  . H/O total knee replacement 02/20/2014  . Obesity (BMI 30-39.9) 11/12/2013  . Chronic diastolic CHF (congestive heart failure) ([place]) 11/17/2012  . Meniscus, lateral, anterior horn derangement 11/10/2011  . Medial meniscus, posterior horn derangement 11/10/2011  . Osteoarthritis of right knee 11/10/2011  . Vertigo 10/05/2011  . HTN (hypertension), benign 10/05/2011  . Coronary artery disease 08/27/2010  . MUSCLE CRAMPS 12/29/2009  . History of myocardial infarction 11/19/2009  . Extrinsic asthma 11/19/2009  . GERD 11/19/2009  . Cough 11/19/2009  . Hyperlipidemia 11/18/2009    Current Outpatient Medications on File Prior to Visit  Medication Sig Dispense Refill  . albuterol (PROVENTIL HFA;VENTOLIN HFA) 108 (90 BASE) MCG/ACT inhaler Inhale 2 puffs into the lungs every 6 (six) hours as needed. For shortness of breath. 1 Inhaler 0  . ALPRAZolam (XANAX) 0.5 MG tablet Take 0.5 mg by mouth 2 (two) times daily.  2  . alum & mag hydroxide-simeth (MAALOX/MYLANTA) 200-200-20 MG/5ML suspension Take 30 mLs as needed by mouth for indigestion or heartburn.    Marland Kitchen aspirin EC 81 MG tablet Take 1 tablet (81 mg total) by mouth daily.    . baclofen (LIORESAL) 20 MG tablet Take 20 mg 2 (two) times daily by mouth.    Marland Kitchen  benzonatate (TESSALON) 100 MG capsule Take 100 mg by mouth 2 (two) times daily as needed for cough.     . Blood Glucose Monitoring Suppl (ONE TOUCH ULTRA MINI) w/Device KIT 3 (three) times daily. for testing  0  . budesonide-formoterol (SYMBICORT) 160-4.5 MCG/ACT inhaler Inhale 2 puffs into the lungs 2 (two) times daily.    . calcium carbonate (TUMS - DOSED IN MG ELEMENTAL CALCIUM) 500 MG chewable tablet Chew 2 tablets daily as needed by mouth for indigestion or heartburn.    . Cholecalciferol (VITAMIN D) 2000 UNITS tablet Take 2,000 Units by mouth daily.     . clonazePAM (KLONOPIN)  0.5 MG tablet Take 0.5 mg daily by mouth.    . clopidogrel (PLAVIX) 75 MG tablet Take 1 tablet (75 mg total) by mouth daily. 90 tablet 3  . CREON 24000-76000 units CPEP TAKE 2 CAPSULES BY MOUTH 3 TIMES A DAY WITH MEALS (TAK 1 CAPSULE WITH A SNACK) 210 capsule 1  . cyclobenzaprine (FLEXERIL) 5 MG tablet TAKE 1 TABLET BY MOUTH AT BEDTIME 30 tablet 0  . diclofenac sodium (VOLTAREN) 1 % GEL Apply 2 g topically 4 (four) times daily as needed (muscle pain).    Marland Kitchen diltiazem (CARDIZEM CD) 240 MG 24 hr capsule Take 1 capsule (240 mg total) by mouth daily. 30 capsule 10  . ezetimibe (ZETIA) 10 MG tablet Take 10 mg by mouth daily.  6  . fexofenadine (ALLEGRA) 180 MG tablet Take 1 tablet (180 mg total) by mouth daily. 30 tablet 6  . fluticasone (FLONASE) 50 MCG/ACT nasal spray Place 2 sprays into both nostrils daily. 16 g 6  . Fluticasone-Umeclidin-Vilant (TRELEGY ELLIPTA) 100-62.5-25 MCG/INH AEPB Inhale 1 puff into the lungs daily. 1 each 0  . insulin lispro protamine-insulin lispro (HUMALOG 75/25) (75-25) 100 UNIT/ML SUSP Inject 20-30 Units into the skin See admin instructions. Takes 20 units in the morning and 30  units with supper    . loperamide (IMODIUM) 2 MG capsule Take 2 mg by mouth as needed for diarrhea or loose stools.     . meclizine (ANTIVERT) 25 MG tablet Take 25 mg by mouth 2 (two) times daily as needed for dizziness.     . methocarbamol (ROBAXIN) 500 MG tablet Take 500 mg 2 (two) times daily by mouth.    . metoprolol succinate (TOPROL-XL) 100 MG 24 hr tablet Take 1 tablet (100 mg total) by mouth daily. 90 tablet 1  . montelukast (SINGULAIR) 5 MG chewable tablet Chew 1 tablet (5 mg total) by mouth at bedtime. 30 tablet 3  . Multiple Vitamin (MULTIVITAMIN WITH MINERALS) TABS Take 1 tablet by mouth daily.    . Nebulizers (COMPRESSOR/NEBULIZER) MISC Use with albuterol 1 each 0  . nitroGLYCERIN (NITROSTAT) 0.4 MG SL tablet Place 0.4 mg under the tongue every 5 (five) minutes as needed for chest  pain.    . nortriptyline (PAMELOR) 25 MG capsule Take 1 capsule (25 mg total) by mouth at bedtime. 30 capsule 3  . ONE TOUCH ULTRA TEST test strip CHECK BLOOD SUGAR TWICE A DAY DX: E11.9  3  . ONETOUCH DELICA LANCETS 93O MISC 3 (three) times daily. for testing  0  . pantoprazole (PROTONIX) 40 MG tablet Take 1 tablet (40 mg total) by mouth 2 (two) times daily before a meal. 180 tablet 3  . Spacer/Aero-Holding Chambers (AEROCHAMBER MV) inhaler Use as instructed 1 each 0  . furosemide (LASIX) 40 MG tablet Take 1 tablet (40 mg total) by mouth daily. (Patient  taking differently: Take 40 mg by mouth 2 (two) times daily. ) 90 tablet 3  . isosorbide mononitrate (IMDUR) 60 MG 24 hr tablet Take 1.5 tablets (90 mg total) by mouth daily. 135 tablet 3   Current Facility-Administered Medications on File Prior to Visit  Medication Dose Route Frequency Provider Last Rate Last Dose  . cyanocobalamin ((VITAMIN B-12)) injection 1,000 mcg  1,000 mcg Intramuscular Q30 days Binnie Rail, MD   1,000 mcg at 03/26/16 1658    Past Medical History:  Diagnosis Date  . Adenomatous colon polyp   . Allergy   . Anxiety   . Asthma   . Chest pain   . Chronic diastolic CHF (congestive heart failure) ([place])   . COPD (chronic obstructive pulmonary disease) ([place])    pt is unsure if has been officially diagnosed  . Coronary artery disease    CABG '09- cathed 12/09, 9/10, 6/11, 3/14 and 12/13/16- medical Rx  . Diabetes mellitus   . GERD (gastroesophageal reflux disease)   . Hiatal hernia   . Hyperlipidemia   . Hypertension   . Myocardial infarction ([place]) 2009  . PONV (postoperative nausea and vomiting)   . Schatzki's ring   . Shoulder injury    resolved after shoulder surgery  . Sleep apnea    not on cpap    Past Surgical History:  Procedure Laterality Date  . ABDOMINAL HYSTERECTOMY    . APPENDECTOMY     came out with Hysterectomy  . CARDIAC CATHETERIZATION  07/23/2009   EF 60%  . CARDIAC CATHETERIZATION   10/11/2008  . CARDIAC CATHETERIZATION  03/01/2007   EF 75-80%  . CARDIOVASCULAR STRESS TEST  11/15/2007   EF 60%  . COLONOSCOPY    . CORONARY ARTERY BYPASS GRAFT     SEVERELY DISEASED SAPHENOUS VEIN GRAFT TO THE RIGHT CORONARY ARTERY BUT WITH FAIRLY WELL PRESERVED FLOW TO THE DISTAL RIGHT CORONARY ARTERY FROM THE NATIVE CIRCULATION-RESTART  CATH IN JUNE 2000, REVEALS MILD/MODERATE  CAD WITH GOOD FLOW DOWN HER LAD  . ESOPHAGOGASTRODUODENOSCOPY    . EYE SURGERY     bilateral cataract surgery with lens implant  . LEFT HEART CATH AND CORS/GRAFTS ANGIOGRAPHY N/A 12/14/2016   Procedure: LEFT HEART CATH AND CORS/GRAFTS ANGIOGRAPHY;  Surgeon: Jettie Booze, MD;  Location: [facility];  Service: Cardiovascular;  Laterality: N/A;  . lense removal Left   . POLYPECTOMY    . ROTATOR CUFF REPAIR     right and left  . TONSILLECTOMY     age 73  . 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 tumor removed kidney    . UPPER GASTROINTESTINAL ENDOSCOPY    . US ECHOCARDIOGRAPHY  03/08/2008   EF 55-60%    Social History   Socioeconomic History  . Marital status: Widowed    Spouse name: Not on file  . Number of children: 4  . Years of education: Designer, jewellery  . Highest education level: Not on file  Occupational History  . Occupation: Retired  Scientific laboratory technician  . Financial resource strain: Not hard at all  . Food insecurity:    Worry: Never true    Inability: Never true  . Transportation needs:    Medical: No    Non-medical: No  Tobacco Use  . Smoking status: Former Smoker    Packs/day: 0.50    Years: 21.00  Pack years: 10.50    Types: Cigarettes    Start date: 02/09/1955    Last attempt to quit: 02/09/1976    Years since quitting: 41.5  . Smokeless tobacco: Never Used  Substance and Sexual Activity  . Alcohol use: No    Alcohol/week: 0.0 oz  . Drug  use: No  . Sexual activity: Never  Lifestyle  . Physical activity:    Days per week: 2 days    Minutes per session: 40 min  . Stress: Very much  Relationships  . Social connections:    Talks on phone: More than three times a week    Gets together: More than three times a week    Attends religious service: More than 4 times per year    Active member of club or organization: Not on file    Attends meetings of clubs or organizations: More than 4 times per year    Relationship status: Widowed  Other Topics Concern  . Not on file  Social History Narrative   Lives alone.  One story home.  Has 4 children.  Education: doctorate in theology.    Caffeine use: Drinks 1 cup coffee/day      Originally from [place]. Previously has lived in [place]. Prior travel to [place], [place], [place], [place], [place], [place], [place], & [place]. Previously worked in [employer] She has a dog currently. No bird, mold, or hot tub exposure. She also pastors a church.     Family History  Problem Relation Age of Onset  . Heart disease Maternal Grandfather   . Heart failure Maternal Grandfather   . Diabetes Maternal Grandfather   . Heart attack Father   . Diabetes Mother   . Rheum arthritis Sister   . Emphysema Paternal Uncle   . Esophageal cancer Brother 52       she said he was born with it  . Emphysema Paternal Aunt   . Neuropathy Neg Hx   . Multiple sclerosis Neg Hx   . Colon cancer Neg Hx   . Colon polyps Neg Hx   . Rectal cancer Neg Hx   . Stomach cancer Neg Hx     Review of Systems  Constitutional: Negative for chills and fever.  Respiratory: Negative for cough, shortness of breath and wheezing.   Cardiovascular: Positive for chest pain (not cardiac per cardiology), palpitations (from fatigue/not swelling) and leg swelling.  Musculoskeletal: Positive for arthralgias.  Neurological: Positive for dizziness, light-headedness and headaches.  Psychiatric/Behavioral: Positive for sleep disturbance.        Objective:   Vitals:   08/01/17 1121  BP: 124/60  Pulse: (!) 56  Resp: 16  Temp: 98.9 F (37.2 C)   BP Readings from Last 3 Encounters:  08/01/17 124/60  07/20/17 (!) 146/76  07/07/17 (!) 142/82   Wt Readings from Last 3 Encounters:  08/01/17 193 lb (87.5 kg)  07/20/17 197 lb (89.4 kg)  06/24/17 196 lb 2 oz (89 kg)   Body mass index is 34.19 kg/m.   Physical Exam    Constitutional: Appears well-developed and well-nourished. No distress.  HENT:  Head: Normocephalic and atraumatic.  Neck: Neck supple. No tracheal deviation present. No thyromegaly present.  No cervical lymphadenopathy Cardiovascular: Normal rate, regular rhythm and normal heart sounds.   2/6 systolic murmur heard. No carotid bruit .  No edema Pulmonary/Chest: Effort normal and breath sounds normal. No respiratory distress. No has no wheezes. No rales.  Skin: Skin is warm and dry. Not  diaphoretic.  Psychiatric: Normal mood and affect. Behavior is normal.      Assessment & Plan:    See Problem List for Assessment and Plan of chronic medical problems.

## 2017-08-01 ENCOUNTER — Ambulatory Visit (INDEPENDENT_AMBULATORY_CARE_PROVIDER_SITE_OTHER): Payer: Medicare Other | Admitting: Internal Medicine

## 2017-08-01 ENCOUNTER — Encounter: Payer: Self-pay | Admitting: Internal Medicine

## 2017-08-01 VITALS — BP 124/60 | HR 56 | Temp 98.9°F | Resp 16 | Wt 193.0 lb

## 2017-08-01 DIAGNOSIS — K219 Gastro-esophageal reflux disease without esophagitis: Secondary | ICD-10-CM

## 2017-08-01 DIAGNOSIS — E785 Hyperlipidemia, unspecified: Secondary | ICD-10-CM

## 2017-08-01 DIAGNOSIS — M2550 Pain in unspecified joint: Secondary | ICD-10-CM | POA: Diagnosis not present

## 2017-08-01 DIAGNOSIS — I10 Essential (primary) hypertension: Secondary | ICD-10-CM | POA: Diagnosis not present

## 2017-08-01 DIAGNOSIS — E1059 Type 1 diabetes mellitus with other circulatory complications: Secondary | ICD-10-CM

## 2017-08-01 DIAGNOSIS — E538 Deficiency of other specified B group vitamins: Secondary | ICD-10-CM

## 2017-08-01 DIAGNOSIS — D649 Anemia, unspecified: Secondary | ICD-10-CM | POA: Diagnosis not present

## 2017-08-01 DIAGNOSIS — G4709 Other insomnia: Secondary | ICD-10-CM

## 2017-08-01 MED ORDER — SUVOREXANT 10 MG PO TABS: 10.0000 mg | ORAL_TABLET | Freq: Every evening | ORAL | 5 refills | Status: DC

## 2017-08-01 MED ORDER — FAMOTIDINE 40 MG PO TABS
40.0000 mg | ORAL_TABLET | Freq: Every day | ORAL | 1 refills | Status: DC
Start: 2017-08-01 — End: 2017-11-14

## 2017-08-01 MED ORDER — BACLOFEN 20 MG PO TABS: 20.0000 mg | ORAL_TABLET | Freq: Three times a day (TID) | ORAL | 5 refills | Status: DC

## 2017-08-01 MED ORDER — CYANOCOBALAMIN 1000 MCG/ML IJ SOLN
1000.0000 ug | Freq: Once | INTRAMUSCULAR | Status: AC
  Administered 2017-08-01: 1000 ug via INTRAMUSCULAR

## 2017-08-01 NOTE — Assessment & Plan Note (Signed)
Trazodone not effective Trial of belsomra if covered

## 2017-08-01 NOTE — Assessment & Plan Note (Signed)
Not controlled Continue protonix twice daily Add pepcid nightly

## 2017-08-01 NOTE — Assessment & Plan Note (Signed)
BP well controlled Current regimen effective and well tolerated Continue current medications at current doses cmp  

## 2017-08-01 NOTE — Assessment & Plan Note (Signed)
H/o iron def anemia when was younger Eats well balanced diet Check cbc, iron panel

## 2017-08-01 NOTE — Assessment & Plan Note (Signed)
Management per Dr Chalmers Cater Check a1c

## 2017-08-01 NOTE — Addendum Note (Signed)
Addended by: Terence Lux B on: 08/01/2017 03:10 PM   Modules accepted: Orders

## 2017-08-01 NOTE — Assessment & Plan Note (Signed)
B12 injection due - given today

## 2017-08-01 NOTE — Assessment & Plan Note (Signed)
Check labs Osteoarthritis and less likely autoimmune Ana, esr, crp, RF

## 2017-08-01 NOTE — Assessment & Plan Note (Signed)
Check lipid panel  Continue zetia Regular exercise and healthy diet encouraged

## 2017-08-04 ENCOUNTER — Ambulatory Visit (INDEPENDENT_AMBULATORY_CARE_PROVIDER_SITE_OTHER): Payer: Medicare Other | Admitting: Pharmacist

## 2017-08-04 VITALS — BP 142/76 | HR 62

## 2017-08-04 DIAGNOSIS — I10 Essential (primary) hypertension: Secondary | ICD-10-CM

## 2017-08-04 NOTE — Progress Notes (Signed)
Patient ID: SHEKIRA DRUMMER                 DOB: 08/11/1940                      MRN: [id number]     HPI: [name] is a 77 y.o. female patient of Dr. Acie Fredrickson who presents today for hypertension follow up, referred by Robbie Lis, PA. PMH significant for CAD s/p CABG, HTN, HLD (statin intolerance, she is followed in lipid clinic), DM, COPD-Asthma overlap syndrome, former smoker (She smoked 1/2 ppd for about 35 years, quit at age 54) GERD, OSA and chronic diastolic CHF. She has previously been seen by me for lipid management. She was started on Repatha which she did not tolerate due to muscle aching and the medication was discontinued. Previously she stopped her lisinopril as she was convinced this was causing her nose bleeds. At her last OV in HTN clinic her metoprolol was increased slightly to 138m daily. She was advised to follow up with PCP due to gait instability. She did see her PCP since then and pressure was controlled.   She presents today for follow up. Still using albuterol regularly. She now believes headaches are due to insulin dose. She reports that her pressures have still up and down but in the same ball park rather than dramatic swings. Overall she is feeling better and believes that most of her current concerns are related to her blood sugars. She does state that she has had some dizziness, particularly in the mornings and her PCP had mentioned it was likely related to her blood pressure and or her sleep medications.    Current HTN meds:  Diltiazem 2428mdaily in the morning Furosemide 4076mID Imdur 46m88mily at dinner time Metoprolol succinate 100mg11mly in the evening  Previously tried: amlodipine (hallucinations), lisinopril (nose bleeds per patient)  BP goal: <130/80  Diet: She tries to eat healthy as much as possible. Eats lean proteins, avoids red meats. Eats lots of greens. Very cautious about carbohydrates because she is a diabetic. Doesn't drink sodas. Drinks juice  occasionally.   Exercise: Will be starting to walk 3x a week.   Family History: The patient's family history includes Diabetes in her maternal grandfather and mother; Emphysema in her paternal aunt and paternal uncle; Esophageal cancer (age of onset: 32) i85her brother; Heart attack in her father; Heart disease in her maternal grandfather; Heart failure in her maternal grandfather; Rheum arthritis in her sister; Stomach cancer in her father.  Social History: The patient reports that she quit smoking about 40 years ago. Her smoking use included Cigarettes. She started smoking about 61 years ago. She has a 10.50 pack-year smoking history. She has never used smokeless tobacco. She reports that she does not drink alcohol or use drugs.  Home BP readings: 120s-140s/80s HR 60s-70s   Wt Readings from Last 3 Encounters:  08/01/17 193 lb (87.5 kg)  07/20/17 197 lb (89.4 kg)  06/24/17 196 lb 2 oz (89 kg)   BP Readings from Last 3 Encounters:  08/04/17 (!) 142/76  08/01/17 124/60  07/20/17 (!) 146/76   Pulse Readings from Last 3 Encounters:  08/04/17 62  08/01/17 (!) 56  07/20/17 62    Renal function: CrCl cannot be calculated (Patient's most recent lab result is older than the maximum 21 days allowed.).  Past Medical History:  Diagnosis Date  . Adenomatous colon polyp   . Allergy   .  Anxiety   . Asthma   . Chest pain   . Chronic diastolic CHF (congestive heart failure) ([place])   . COPD (chronic obstructive pulmonary disease) ([place])    pt is unsure if has been officially diagnosed  . Coronary artery disease    CABG '09- cathed 12/09, 9/10, 6/11, 3/14 and 12/13/16- medical Rx  . Diabetes mellitus   . GERD (gastroesophageal reflux disease)   . Hiatal hernia   . Hyperlipidemia   . Hypertension   . Myocardial infarction ([place]) 2009  . PONV (postoperative nausea and vomiting)   . Schatzki's ring   . Shoulder injury    resolved after shoulder surgery  . Sleep apnea    not on cpap      Current Outpatient Medications on File Prior to Visit  Medication Sig Dispense Refill  . albuterol (PROVENTIL HFA;VENTOLIN HFA) 108 (90 BASE) MCG/ACT inhaler Inhale 2 puffs into the lungs every 6 (six) hours as needed. For shortness of breath. 1 Inhaler 0  . alum & mag hydroxide-simeth (MAALOX/MYLANTA) 200-200-20 MG/5ML suspension Take 30 mLs as needed by mouth for indigestion or heartburn.    Marland Kitchen aspirin EC 81 MG tablet Take 1 tablet (81 mg total) by mouth daily.    . baclofen (LIORESAL) 20 MG tablet Take 1 tablet (20 mg total) by mouth 3 (three) times daily. 90 tablet 5  . benzonatate (TESSALON) 100 MG capsule Take 100 mg by mouth 2 (two) times daily as needed for cough.     . Blood Glucose Monitoring Suppl (ONE TOUCH ULTRA MINI) w/Device KIT 3 (three) times daily. for testing  0  . budesonide-formoterol (SYMBICORT) 160-4.5 MCG/ACT inhaler Inhale 2 puffs into the lungs 2 (two) times daily.    . calcium carbonate (TUMS - DOSED IN MG ELEMENTAL CALCIUM) 500 MG chewable tablet Chew 2 tablets daily as needed by mouth for indigestion or heartburn.    . Cholecalciferol (VITAMIN D) 2000 UNITS tablet Take 2,000 Units by mouth daily.     . clonazePAM (KLONOPIN) 0.5 MG tablet Take 0.5 mg daily by mouth.    . clopidogrel (PLAVIX) 75 MG tablet Take 1 tablet (75 mg total) by mouth daily. 90 tablet 3  . CREON 24000-76000 units CPEP TAKE 2 CAPSULES BY MOUTH 3 TIMES A DAY WITH MEALS (TAK 1 CAPSULE WITH A SNACK) 210 capsule 1  . diclofenac sodium (VOLTAREN) 1 % GEL Apply 2 g topically 4 (four) times daily as needed (muscle pain).    Marland Kitchen diltiazem (CARDIZEM CD) 240 MG 24 hr capsule Take 1 capsule (240 mg total) by mouth daily. 30 capsule 10  . ezetimibe (ZETIA) 10 MG tablet Take 10 mg by mouth daily.  6  . famotidine (PEPCID) 40 MG tablet Take 1 tablet (40 mg total) by mouth at bedtime. (Patient taking differently: Take 40 mg by mouth daily as needed. ) 90 tablet 1  . fexofenadine (ALLEGRA) 180 MG tablet Take 1  tablet (180 mg total) by mouth daily. (Patient taking differently: Take 180 mg by mouth daily as needed. ) 30 tablet 6  . fluticasone (FLONASE) 50 MCG/ACT nasal spray Place 2 sprays into both nostrils daily. 16 g 6  . Fluticasone-Umeclidin-Vilant (TRELEGY ELLIPTA) 100-62.5-25 MCG/INH AEPB Inhale 1 puff into the lungs daily. 1 each 0  . furosemide (LASIX) 40 MG tablet Take 1 tablet (40 mg total) by mouth daily. 90 tablet 3  . insulin lispro protamine-insulin lispro (HUMALOG 75/25) (75-25) 100 UNIT/ML SUSP Inject 15-22 Units into the skin See  admin instructions. Takes 15 units in the morning and 22  units with supper    . isosorbide mononitrate (IMDUR) 60 MG 24 hr tablet Take 1.5 tablets (90 mg total) by mouth daily. 135 tablet 3  . loperamide (IMODIUM) 2 MG capsule Take 2 mg by mouth as needed for diarrhea or loose stools.     . meclizine (ANTIVERT) 25 MG tablet Take 25 mg by mouth 2 (two) times daily as needed for dizziness.     . metoprolol succinate (TOPROL-XL) 100 MG 24 hr tablet Take 1 tablet (100 mg total) by mouth daily. 90 tablet 1  . montelukast (SINGULAIR) 5 MG chewable tablet Chew 1 tablet (5 mg total) by mouth at bedtime. 30 tablet 3  . Multiple Vitamin (MULTIVITAMIN WITH MINERALS) TABS Take 1 tablet by mouth daily.    . Nebulizers (COMPRESSOR/NEBULIZER) MISC Use with albuterol 1 each 0  . nortriptyline (PAMELOR) 25 MG capsule Take 1 capsule (25 mg total) by mouth at bedtime. 30 capsule 3  . ONE TOUCH ULTRA TEST test strip CHECK BLOOD SUGAR TWICE A DAY DX: E11.9  3  . ONETOUCH DELICA LANCETS 77C MISC 3 (three) times daily. for testing  0  . pantoprazole (PROTONIX) 40 MG tablet Take 1 tablet (40 mg total) by mouth 2 (two) times daily before a meal. 180 tablet 3  . Spacer/Aero-Holding Chambers (AEROCHAMBER MV) inhaler Use as instructed 1 each 0  . ALPRAZolam (XANAX) 0.5 MG tablet Take 0.5 mg by mouth 2 (two) times daily.  2  . nitroGLYCERIN (NITROSTAT) 0.4 MG SL tablet Place 0.4 mg under  the tongue every 5 (five) minutes as needed for chest pain.    . Suvorexant (BELSOMRA) 10 MG TABS Take 10 mg by mouth Nightly. (Patient not taking: Reported on 08/04/2017) 30 tablet 5   Current Facility-Administered Medications on File Prior to Visit  Medication Dose Route Frequency Provider Last Rate Last Dose  . cyanocobalamin ((VITAMIN B-12)) injection 1,000 mcg  1,000 mcg Intramuscular Q30 days Binnie Rail, MD   1,000 mcg at 03/26/16 1658    Allergies  Allergen Reactions  . Amlodipine Other (See Comments)    hallucinations   . Banana Nausea And Vomiting    Stomach pumped  . Co Q10 [Coenzyme Q10]     Body cramps  . Codeine     Hallucinate, loose identity and don't know who I am  . Morphine Other (See Comments)    Can not function, it immobilizes me   . Pentazocine Nausea And Vomiting  . Pravastatin     Hands locked up  . Repatha [Evolocumab]     myalgias  . Statins     Muscle cramps  . Sulfa Antibiotics Swelling  . Sulfonamide Derivatives Swelling  . Tramadol Hcl     Blood pressure (!) 142/76, pulse 62.   Assessment/Plan: Hypertension: BP today is above goal, but is improved from previous. Home pressures seem to be more stable and she does not have pressures up to 180s as she did previously. Will continue her current regimen without change. Will defer increase in medication at this time as she has had some dizziness. Advised she continue to monitor and call if pressures trend up. Follow up with Dr. Acie Fredrickson as scheduled and HTN clinic as needed.    Thank you, Lelan Pons. Patterson Hammersmith, [place]

## 2017-08-04 NOTE — Patient Instructions (Signed)
CONTINUE all medications as prescribed  Keep an eye on the dizziness and call our office with concerns  Continue to monitor your pressures and call if your pressures trend up.   Follow up with Dr. Acie Fredrickson as recommended

## 2017-08-05 ENCOUNTER — Encounter: Payer: Self-pay | Admitting: Pharmacist

## 2017-08-19 ENCOUNTER — Other Ambulatory Visit: Payer: Self-pay | Admitting: Cardiovascular Disease

## 2017-08-20 ENCOUNTER — Other Ambulatory Visit: Payer: Self-pay | Admitting: Neurology

## 2017-08-31 ENCOUNTER — Ambulatory Visit: Payer: Medicare Other

## 2017-09-05 ENCOUNTER — Ambulatory Visit (INDEPENDENT_AMBULATORY_CARE_PROVIDER_SITE_OTHER): Payer: Medicare Other | Admitting: *Deleted

## 2017-09-05 DIAGNOSIS — E538 Deficiency of other specified B group vitamins: Secondary | ICD-10-CM

## 2017-09-05 MED ORDER — CYANOCOBALAMIN 1000 MCG/ML IJ SOLN
1000.0000 ug | Freq: Once | INTRAMUSCULAR | Status: AC
  Administered 2017-09-05: 1000 ug via INTRAMUSCULAR

## 2017-09-05 NOTE — Progress Notes (Signed)
b12 Injection given.   [name] Alwine J Jaion Lagrange, MD  

## 2017-09-12 ENCOUNTER — Other Ambulatory Visit: Payer: Self-pay | Admitting: Cardiovascular Disease

## 2017-09-21 ENCOUNTER — Telehealth: Payer: Self-pay | Admitting: Pharmacist

## 2017-09-21 NOTE — Telephone Encounter (Signed)
Pt has a history of statin and PCSK9i intolerance. Spoke with her about upcoming ORION 4 study and she is interested in learning more. Will forward to our research team.

## 2017-10-03 NOTE — Progress Notes (Signed)
NEUROLOGY FOLLOW UP OFFICE NOTE  [name] [id number]  HISTORY OF PRESENT ILLNESS: [name] is a 77 year old female with CAD, HTN, type 2 diabetes mellitus, HLD, B12 deficiency, and OSA who follows up for cervicogenic headache.  UPDATE: Improved.  She still wakes up with headache. Intensity:  10/10 Duration:  Wakes up at 5 AM with headache and resolves by 11 AM.  Headache returns at 10PM Frequency:  daily Frequency of abortive medication: Tylenol every 4 hours daily Current NSAIDS:  ASA 14m Current analgesics:  Tylenol Current triptans:  no Current ergotamine:  no Current anti-emetic:  Promethazine 244mCurrent muscle relaxants:  Baclofen 2095mwice daily Current anti-anxiolytic:  Klonopin Current sleep aide:  trazodone Current Antihypertensive medications:  Toprol XL 48m30mardizem, Lasix, Imdur Current Antidepressant medications:  Nortriptyline 25mg1mrent Anticonvulsant medications:  no Current anti-CGRP:  no Current Vitamins/Herbal/Supplements:  B12 Current Antihistamines/Decongestants:  Allegra, Singulair Other therapy:  no  She was referred to physical therapy for balance.  Caffeine:  1 cup coffee daily, tea Alcohol:  no Smoker:  no Diet:  hydrates Exercise:  yes Depression:  no; Anxiety:  no Other pain:  no Sleep hygiene:  Poor.  Untreated OSA.  She lost her CPAP because she was told she was noncompliant but she disputes that.  HISTORY: Onset:  She has history of migraines as a young woman which resolved after having children.  Current headaches started in 2016. Location:  She has constant holocephalic headache.  She has severe headache radiating from the base of her neck on the right up Quality:  pounding Initial Intensity:  10/10 Aura:  no Prodrome:  no Postdrome:  no Associated symptoms:  Nausea, phonophobia, dizziness.  No vomiting, photophobia or visual disturbance.  She has not had any new worse headache of her life, waking up from  sleep Initial Duration:  All day Initial Frequency:  Daily headache, severe  Headache every other day Initial Frequency of abortive medication: daily Triggers/exacerbating factors:  no Relieving factors:  no Activity:  aggravates  Past NSAIDS:  Ibuprofen, naproxen (caused increased heart rate) Past analgesics:  Excedrin, tramadol and other opioids (adverse reaction) Past abortive triptans:  no Past muscle relaxants:  Robaxin, Flexeril Past anti-emetic:  Promethazine (effective) Past antihypertensive medications:  losartan Past antidepressant medications:  no Past anticonvulsant medications:  Gabapentin 200mg 3mwsiness) Past vitamins/Herbal/Supplements:  CoQ10 Past antihistamines/decongestants:  no Other past therapy:  Physical therapy of neck.  Family history of headache:  father  MRI of brain without contrast from 09/16/14 was personally reviewed and revealed partially empty sella but otherwise unremarkable.  MRI of cervical spine from 09/16/14 was personally reviewed and demonstrated multilevel degenerative changes including left greater than right spurring at C3-C4 causing left foraminal narrowing, severe spurring at C4-C5 causing right moderate foraminal narrowing, and disc bulg and spurring at C6-C7 with moderate bilateral foraminal narrowing.  She was evaluated by Dr. Ramos Nelva Bushnterventional pain (epidural or facet joint injections).  He didn't think it was a good idea given that she would have to stop Plavix.  PAST MEDICAL HISTORY: Past Medical History:  Diagnosis Date  . Adenomatous colon polyp   . Allergy   . Anxiety   . Asthma   . Chest pain   . Chronic diastolic CHF (congestive heart failure) (HCC)  [place]  [place] is unsure if has been officially diagnosed  . Coronary artery disease    [facility] '09- cathed  12/09, 9/10, 6/11, 3/14 and 12/13/16- medical Rx  . Diabetes mellitus   . GERD (gastroesophageal reflux disease)   .  Hiatal hernia   . Hyperlipidemia   . Hypertension   . Myocardial infarction ([place]) 2009  . PONV (postoperative nausea and vomiting)   . Schatzki's ring   . Shoulder injury    resolved after shoulder surgery  . Sleep apnea    not on cpap    MEDICATIONS: Current Outpatient Medications on File Prior to Visit  Medication Sig Dispense Refill  . albuterol (PROVENTIL HFA;VENTOLIN HFA) 108 (90 BASE) MCG/ACT inhaler Inhale 2 puffs into the lungs every 6 (six) hours as needed. For shortness of breath. 1 Inhaler 0  . ALPRAZolam (XANAX) 0.5 MG tablet Take 0.5 mg by mouth 2 (two) times daily.  2  . alum & mag hydroxide-simeth (MAALOX/MYLANTA) 200-200-20 MG/5ML suspension Take 30 mLs as needed by mouth for indigestion or heartburn.    Marland Kitchen aspirin EC 81 MG tablet Take 1 tablet (81 mg total) by mouth daily.    . baclofen (LIORESAL) 20 MG tablet Take 1 tablet (20 mg total) by mouth 3 (three) times daily. 90 tablet 5  . benzonatate (TESSALON) 100 MG capsule Take 100 mg by mouth 2 (two) times daily as needed for cough.     . Blood Glucose Monitoring Suppl (ONE TOUCH ULTRA MINI) w/Device KIT 3 (three) times daily. for testing  0  . budesonide-formoterol (SYMBICORT) 160-4.5 MCG/ACT inhaler Inhale 2 puffs into the lungs 2 (two) times daily.    . calcium carbonate (TUMS - DOSED IN MG ELEMENTAL CALCIUM) 500 MG chewable tablet Chew 2 tablets daily as needed by mouth for indigestion or heartburn.    . Cholecalciferol (VITAMIN D) 2000 UNITS tablet Take 2,000 Units by mouth daily.     . clonazePAM (KLONOPIN) 0.5 MG tablet Take 0.5 mg daily by mouth.    . clopidogrel (PLAVIX) 75 MG tablet Take 1 tablet (75 mg total) by mouth daily. 90 tablet 3  . CREON 24000-76000 units CPEP TAKE 2 CAPSULES BY MOUTH 3 TIMES A DAY WITH MEALS (TAK 1 CAPSULE WITH A SNACK) 210 capsule 1  . diclofenac sodium (VOLTAREN) 1 % GEL Apply 2 g topically 4 (four) times daily as needed (muscle pain).    Marland Kitchen diltiazem (CARDIZEM CD) 240 MG 24 hr capsule  TAKE 1 CAPSULE BY MOUTH EVERY DAY 90 capsule 2  . ezetimibe (ZETIA) 10 MG tablet Take 10 mg by mouth daily.  6  . famotidine (PEPCID) 40 MG tablet Take 1 tablet (40 mg total) by mouth at bedtime. (Patient taking differently: Take 40 mg by mouth daily as needed. ) 90 tablet 1  . fexofenadine (ALLEGRA) 180 MG tablet Take 1 tablet (180 mg total) by mouth daily. (Patient taking differently: Take 180 mg by mouth daily as needed. ) 30 tablet 6  . fluticasone (FLONASE) 50 MCG/ACT nasal spray Place 2 sprays into both nostrils daily. 16 g 6  . Fluticasone-Umeclidin-Vilant (TRELEGY ELLIPTA) 100-62.5-25 MCG/INH AEPB Inhale 1 puff into the lungs daily. 1 each 0  . furosemide (LASIX) 40 MG tablet Take 1 tablet (40 mg total) by mouth daily. 90 tablet 3  . insulin lispro protamine-insulin lispro (HUMALOG 75/25) (75-25) 100 UNIT/ML SUSP Inject 15-22 Units into the skin See admin instructions. Takes 15 units in the morning and 22  units with supper    . isosorbide mononitrate (IMDUR) 60 MG 24 hr tablet Take 1.5 tablets (90 mg total) by mouth daily.  135 tablet 3  . loperamide (IMODIUM) 2 MG capsule Take 2 mg by mouth as needed for diarrhea or loose stools.     . meclizine (ANTIVERT) 25 MG tablet Take 25 mg by mouth 2 (two) times daily as needed for dizziness.     . metoprolol succinate (TOPROL-XL) 100 MG 24 hr tablet Take 1 tablet (100 mg total) by mouth daily. 90 tablet 1  . montelukast (SINGULAIR) 5 MG chewable tablet Chew 1 tablet (5 mg total) by mouth at bedtime. 30 tablet 3  . Multiple Vitamin (MULTIVITAMIN WITH MINERALS) TABS Take 1 tablet by mouth daily.    . Nebulizers (COMPRESSOR/NEBULIZER) MISC Use with albuterol 1 each 0  . nitroGLYCERIN (NITROSTAT) 0.4 MG SL tablet Place 0.4 mg under the tongue every 5 (five) minutes as needed for chest pain.    . nortriptyline (PAMELOR) 25 MG capsule TAKE ONE CAPSULE BY MOUTH AT BEDTIME 90 capsule 2  . ONE TOUCH ULTRA TEST test strip CHECK BLOOD SUGAR TWICE A DAY DX:  E11.9  3  . ONETOUCH DELICA LANCETS 26S MISC 3 (three) times daily. for testing  0  . pantoprazole (PROTONIX) 40 MG tablet Take 1 tablet (40 mg total) by mouth 2 (two) times daily before a meal. 180 tablet 3  . Spacer/Aero-Holding Chambers (AEROCHAMBER MV) inhaler Use as instructed 1 each 0  . Suvorexant (BELSOMRA) 10 MG TABS Take 10 mg by mouth Nightly. (Patient not taking: Reported on 08/04/2017) 30 tablet 5   Current Facility-Administered Medications on File Prior to Visit  Medication Dose Route Frequency Provider Last Rate Last Dose  . cyanocobalamin ((VITAMIN B-12)) injection 1,000 mcg  1,000 mcg Intramuscular Q30 days Binnie Rail, MD   1,000 mcg at 03/26/16 1658    ALLERGIES: Allergies  Allergen Reactions  . Amlodipine Other (See Comments)    hallucinations   . Banana Nausea And Vomiting    Stomach pumped  . Co Q10 [Coenzyme Q10]     Body cramps  . Codeine     Hallucinate, loose identity and don't know who I am  . Morphine Other (See Comments)    Can not function, it immobilizes me   . Pentazocine Nausea And Vomiting  . Pravastatin     Hands locked up  . Repatha [Evolocumab]     myalgias  . Statins     Muscle cramps  . Sulfa Antibiotics Swelling  . Sulfonamide Derivatives Swelling  . Tramadol Hcl     FAMILY HISTORY: Family History  Problem Relation Age of Onset  . Heart disease Maternal Grandfather   . Heart failure Maternal Grandfather   . Diabetes Maternal Grandfather   . Heart attack Father   . Diabetes Mother   . Rheum arthritis Sister   . Emphysema Paternal Uncle   . Esophageal cancer Brother 77       she said he was born with it  . Emphysema Paternal Aunt   . Neuropathy Neg Hx   . Multiple sclerosis Neg Hx   . Colon cancer Neg Hx   . Colon polyps Neg Hx   . Rectal cancer Neg Hx   . Stomach cancer Neg Hx     SOCIAL HISTORY: Social History   Socioeconomic History  . Marital status: Widowed    Spouse name: Not on file  . Number of children: 4   . Years of education: Designer, jewellery  . Highest education level: Not on file  Occupational History  . Occupation: Retired  Scientific laboratory technician  .  Financial resource strain: Not hard at all  . Food insecurity:    Worry: Never true    Inability: Never true  . Transportation needs:    Medical: No    Non-medical: No  Tobacco Use  . Smoking status: Former Smoker    Packs/day: 0.50    Years: 21.00    Pack years: 10.50    Types: Cigarettes    Start date: 02/09/1955    Last attempt to quit: 02/09/1976    Years since quitting: 41.6  . Smokeless tobacco: Never Used  Substance and Sexual Activity  . Alcohol use: No    Alcohol/week: 0.0 standard drinks  . Drug use: No  . Sexual activity: Never  Lifestyle  . Physical activity:    Days per week: 2 days    Minutes per session: 40 min  . Stress: Very much  Relationships  . Social connections:    Talks on phone: More than three times a week    Gets together: More than three times a week    Attends religious service: More than 4 times per year    Active member of club or organization: Not on file    Attends meetings of clubs or organizations: More than 4 times per year    Relationship status: Widowed  . Intimate partner violence:    Fear of current or ex partner: Not on file    Emotionally abused: Not on file    Physically abused: Not on file    Forced sexual activity: Not on file  Other Topics Concern  . Not on file  Social History Narrative   Lives alone.  One story home.  Has 4 children.  Education: doctorate in theology.    Caffeine use: Drinks 1 cup coffee/day      Originally from [place]. Previously has lived in [place]. Prior travel to [place], [place], [place], [place], [place], [place], [place], & [place]. Previously worked in [employer] She has a dog currently. No bird, mold, or hot tub exposure. She also pastors a church.     REVIEW OF SYSTEMS: Constitutional: No fevers, chills, or sweats, no generalized fatigue, change in appetite Eyes: No  visual changes, double vision, eye pain Ear, nose and throat: No hearing loss, ear pain, nasal congestion, sore throat Cardiovascular: No chest pain, palpitations Respiratory:  No shortness of breath at rest or with exertion, wheezes GastrointestinaI: No nausea, vomiting, diarrhea, abdominal pain, fecal incontinence Genitourinary:  No dysuria, urinary retention or frequency Musculoskeletal:  Neck pain Integumentary: No rash, pruritus, skin lesions Neurological: as above Psychiatric: No depression, insomnia, anxiety Endocrine: No palpitations, fatigue, diaphoresis, mood swings, change in appetite, change in weight, increased thirst Hematologic/Lymphatic:  No purpura, petechiae. Allergic/Immunologic: no itchy/runny eyes, nasal congestion, recent allergic reactions, rashes  PHYSICAL EXAM: Blood pressure (!) 152/74, pulse 72, resp. rate (!) 22, height 5' 3" (1.6 m), weight 194 lb (88 kg). General: No acute distress.  Patient appears well-groomed.   Head:  Normocephalic/atraumatic Eyes:  Fundi examined but not visualized Neck: supple, right sided paraspinal tenderness, full range of motion Heart:  Regular rate and rhythm Lungs:  Clear to auscultation bilaterally Back: No paraspinal tenderness Neurological Exam: alert and oriented to person, place, and time. Attention span and concentration intact, recent and remote memory intact, fund of knowledge intact.  Speech fluent and not dysarthric, language intact.  CN II-XII intact. Bulk and tone normal, muscle strength 5/5 throughout.  Sensation to light touch  intact.  Deep tendon reflexes 2+  throughout.  Finger to nose testing intact.  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  Increase nortriptyline to 38m at bedtime 2.  Limit use of Tylenol to no more than 2 days out of week to  prevent rebound headache 3.  Continue baclofen 272mtwice daily for muscle spasm 4.  Keep headache diary 5.  Refer to physical therapy for unsteady gait 6.  Follow up in 4 months.  AdMetta ClinesDO  CC: StBilley GoslingMD

## 2017-10-04 ENCOUNTER — Ambulatory Visit (INDEPENDENT_AMBULATORY_CARE_PROVIDER_SITE_OTHER): Payer: Medicare Other | Admitting: Neurology

## 2017-10-04 ENCOUNTER — Encounter: Payer: Self-pay | Admitting: Neurology

## 2017-10-04 VITALS — BP 152/74 | HR 72 | Resp 22 | Ht 63.0 in | Wt 194.0 lb

## 2017-10-04 DIAGNOSIS — R2681 Unsteadiness on feet: Secondary | ICD-10-CM

## 2017-10-04 DIAGNOSIS — E1142 Type 2 diabetes mellitus with diabetic polyneuropathy: Secondary | ICD-10-CM

## 2017-10-04 DIAGNOSIS — G43819 Other migraine, intractable, without status migrainosus: Secondary | ICD-10-CM

## 2017-10-04 DIAGNOSIS — G4733 Obstructive sleep apnea (adult) (pediatric): Secondary | ICD-10-CM

## 2017-10-04 MED ORDER — NORTRIPTYLINE HCL 50 MG PO CAPS: 50.0000 mg | ORAL_CAPSULE | Freq: Every day | ORAL | 3 refills | Status: DC

## 2017-10-04 NOTE — Patient Instructions (Signed)
1.  Increase nortriptyline to 50mg  at bedtime 2.  Limit use of Tylenol to no more than 2 days out of week to prevent rebound headache 3.  Continue baclofen 20mg  twice daily for muscle spasm 4.  Keep headache diary 5.  Refer to physical therapy for unsteady gait 6.  Follow up in 4 months.

## 2017-10-07 ENCOUNTER — Ambulatory Visit (INDEPENDENT_AMBULATORY_CARE_PROVIDER_SITE_OTHER): Payer: Medicare Other | Admitting: *Deleted

## 2017-10-07 DIAGNOSIS — E538 Deficiency of other specified B group vitamins: Secondary | ICD-10-CM

## 2017-10-07 MED ORDER — CYANOCOBALAMIN 1000 MCG/ML IJ SOLN
1000.0000 ug | Freq: Once | INTRAMUSCULAR | Status: AC
  Administered 2017-10-07: 1000 ug via INTRAMUSCULAR

## 2017-10-07 NOTE — Progress Notes (Signed)
b12 Injection given.   Javione Gunawan J Raymone Pembroke, MD  

## 2017-10-26 ENCOUNTER — Encounter: Payer: Self-pay | Admitting: Pulmonary Disease

## 2017-10-26 ENCOUNTER — Ambulatory Visit (INDEPENDENT_AMBULATORY_CARE_PROVIDER_SITE_OTHER): Payer: Medicare Other | Admitting: Pulmonary Disease

## 2017-10-26 VITALS — BP 132/80 | HR 75 | Ht 62.0 in | Wt 198.2 lb

## 2017-10-26 DIAGNOSIS — G4733 Obstructive sleep apnea (adult) (pediatric): Secondary | ICD-10-CM

## 2017-10-26 DIAGNOSIS — R911 Solitary pulmonary nodule: Secondary | ICD-10-CM

## 2017-10-26 DIAGNOSIS — R918 Other nonspecific abnormal finding of lung field: Secondary | ICD-10-CM | POA: Diagnosis not present

## 2017-10-26 DIAGNOSIS — J449 Chronic obstructive pulmonary disease, unspecified: Secondary | ICD-10-CM

## 2017-10-26 DIAGNOSIS — Z23 Encounter for immunization: Secondary | ICD-10-CM | POA: Diagnosis not present

## 2017-10-26 MED ORDER — ALBUTEROL SULFATE (2.5 MG/3ML) 0.083% IN NEBU: 2.5000 mg | INHALATION_SOLUTION | Freq: Four times a day (QID) | RESPIRATORY_TRACT | 12 refills | Status: DC | PRN

## 2017-10-26 NOTE — Progress Notes (Signed)
Subjective:   PATIENT ID: [name] GENDER: female DOB: August 01, 1940, MRN: [id number]  Synopsis: Former patient of Dr. Ashok Cordia with COPD-Asthma overlap syndrome, GERD, OSA; She is a retired Education officer, museum from [employer]. She smoked 1/2 ppd for about 35 years, quit at age 77.  She like to avoid steroids when she has flares of her disease.  She will take them if she has to though.   HPI  Chief Complaint  Patient presents with  . Follow-up    Pt states she still become SOB when she exerts herself and states she still has occ CP. Denies any cough.   [name] has been doing fairly well since the last visit but she describes increasing shortness of breath over the last 2 weeks.  She says that this is not associated with worsening cough, tightness or chest congestion.  However, she has noticed more wheezing.  She says that she is using her albuterol on a daily basis now.  It is helpful when she takes it.  She attributes the change in her symptoms to the change in the weather.  She is had some chronic leg swelling which is been present for several years and this has not changed recently.  No recent sick contacts.  No fevers or chills.  She remains compliant with Symbicort 2 puffs twice a day without a spacer.  Past Medical History:  Diagnosis Date  . Adenomatous colon polyp   . Allergy   . Anxiety   . Asthma   . Chest pain   . Chronic diastolic CHF (congestive heart failure) ([place])   . COPD (chronic obstructive pulmonary disease) ([place])    pt is unsure if has been officially diagnosed  . Coronary artery disease    CABG '09- cathed 12/09, 9/10, 6/11, 3/14 and 12/13/16- medical Rx  . Diabetes mellitus   . GERD (gastroesophageal reflux disease)   . Hiatal hernia   . Hyperlipidemia   . Hypertension   . Myocardial infarction ([place]) 2009  . PONV (postoperative nausea and vomiting)   . Schatzki's ring   . Shoulder injury    resolved after shoulder surgery  . Sleep apnea    not on  cpap          Review of Systems  Constitutional: Negative for malaise/fatigue and weight loss.  HENT: Negative for congestion, nosebleeds and sore throat.   Respiratory: Positive for cough, sputum production, shortness of breath and wheezing.   Cardiovascular: Negative for orthopnea, claudication and leg swelling.  Neurological: Negative for weakness.      Objective:  Physical Exam   Vitals:   10/26/17 1409  BP: 132/80  Pulse: 75  SpO2: 100%  Weight: 198 lb 3.2 oz (89.9 kg)  Height: '5\' 2"'  (1.575 m)    Gen: well appearing HENT: OP clear, TM's clear, neck supple PULM: Wheezing bilaterally B, normal percussion CV: RRR, no mgr, trace edema GI: BS+, soft, nontender Derm: no cyanosis or rash Psyche: normal mood and affect    CBC    Component Value Date/Time   WBC 6.1 01/04/2017 1505   RBC 3.89 01/04/2017 1505   HGB 9.3 (L) 01/04/2017 1505   HGB 10.6 (L) 12/10/2016 1346   HCT 31.3 (L) 01/04/2017 1505   HCT 34.1 12/10/2016 1346   PLT 271 01/04/2017 1505   PLT 270 12/10/2016 1346   MCV 80.5 01/04/2017 1505   MCV 78 (L) 12/10/2016 1346   MCH 23.9 (L) 01/04/2017 1505  MCHC 29.7 (L) 01/04/2017 1505   RDW 16.4 (H) 01/04/2017 1505   RDW 16.1 (H) 12/10/2016 1346   LYMPHSABS 2.2 06/21/2016 1628   MONOABS 0.6 05/19/2016 1152   EOSABS 0.1 06/21/2016 1628   BASOSABS 0.0 06/21/2016 1628    PFT 12/23/16: FVC 1.44 L (72%) FEV1 0.96 L (63%) FEV1/FVC 0.67 FEF 25-75 0.55 L (42%) negative bronchodilator response TLC 3.85 L (78%) RV 102% DLCO uncorrected 45% 06/04/15: FVC 1.57 L (77%) FEV1 1.01 L (64%) FEV1/FVC 0.65 FEF 25-75 0.54 L (39%) negative bronchodilator response 02/21/15: FVC 1.60 L (78%) FEV1 1.03 L (65%) FEV1/FVC 0.64 FEF 25-75 0.51 L (37%) positive bronchodilator response 11/20/14: FVC 1.44 L (70%) FEV1 0.91 L (57%) FEV1/FVC 0.63 FEF 25-75 0.47 L (33%) positive bronchodilator response TLC 3.92 L (79%) RV 103% DLCO uncorrected 64% 11/12/13: FVC 1.57 L (74%) FEV1  1.09 L (67%) FEV1/FVC 0.70 FEF 25-75 0.72 L (50%) negative bronchodilator response 08/04/11: FVC 1.61 L (60%) FEV1 0.89 L (48%) FEV1/FVC 0.56 FEF 25-75 0.29 L (14%) positive bronchodilator response TLC 3.67 L (80%) RV 103% ERV 46% DLCO corrected 38%  6MWT 11/20/14:  Walked 336 meters / Baseline Sat 100% on RA / Nadir Sat 99% on RA (pt c/o pain in her right neck w/ dyspnea)  IMAGING November 2018 CT angiogram chest images independently reviewed showing centrilobular predominant emphysema, mild in an upper lobe distribution, no PE, there is a 5 mm right upper lobe nodule.  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 LIMA to LAD is patent.  Prox RCA lesion is 25% stenosed. SVG to PDA is occluded.  Mid RCA lesion is 25% stenosed.  Circumflex is a large tortuous vessel.  Ost 1st Diag lesion is 100% stenosed. SVG to diagonal is patent.  The left ventricular systolic function is normal.  LV end diastolic pressure is normal.  The left ventricular ejection fraction is 55-65% by visual estimate.  There is no aortic valve stenosis.  TTE (10/06/11): LV normal in size. Normal regional wall motion. EF 60-65%. Grade 1 diastolic dysfunction. LA & RA normal in size. 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 RV normal in size and function. RVSP 19 mmHg. No aortic stenosis or regurgitation. No mitral stenosis or regurgitation. No pulmonic stenosis. Trivial tricuspid regurgitation. No pericardial effusion.  LABS 03/13/15 Procalcitonin:  <0.1 CBC: 10.9/11.8/37.3/245  10/08/14 IgG: 1060 IgM: 112 IgA: 272 IgE: 5 CBC: 6.1/11.3/36.0/293 Differential: Eos 0.3 (4.5%)  RAST panel: Negative Aspergillus antigen: <0.1  1/26/9 ABG:  7.39/44/113  Hospitalization records from November 2018 reviewed where she was hospitalized for chest pain     Assessment & Plan:   COPD with asthma ([place])  Pulmonary nodule  OSA (obstructive sleep  apnea)  Discussion: She has worsening control of her COPD-asthma overlap syndrome due to recent weather changes.  She is wheezing on physical exam.  She would like to forego prednisone use so I think the best approach is to give her an albuterol nebulizer to use at home 3 times a day.  If this does not help with control of her wheezing and shortness of breath and she will need to be put on a short course of prednisone.  Plan: Pulmonary nodule: We will make sure that you have a repeat CT scan arranged for November 2019  COPD-asthma overlap syndrome with acute exacerbation: We will prescribe an albuterol nebulizer for you to use at home, take albuterol 3 times a day for the  next 2 to 3 days. If you have not had improved control of wheezing and shortness of breath call me so that I can call in a prescription for prednisone Keep taking Symbicort as you are doing High-dose flu shot today Practice good hand hygiene Stay active  We will see you back in 4-6 months or sooner if needed    Current Outpatient Medications:  .  albuterol (PROVENTIL HFA;VENTOLIN HFA) 108 (90 BASE) MCG/ACT inhaler, Inhale 2 puffs into the lungs every 6 (six) hours as needed. For shortness of breath., Disp: 1 Inhaler, Rfl: 0 .  alum & mag hydroxide-simeth (MAALOX/MYLANTA) 200-200-20 MG/5ML suspension, Take 30 mLs as needed by mouth for indigestion or heartburn., Disp: , Rfl:  .  aspirin EC 81 MG tablet, Take 1 tablet (81 mg total) by mouth daily., Disp: , Rfl:  .  baclofen (LIORESAL) 20 MG tablet, Take 1 tablet (20 mg total) by mouth 3 (three) times daily., Disp: 90 tablet, Rfl: 5 .  benzonatate (TESSALON) 100 MG capsule, Take 100 mg by mouth 2 (two) times daily as needed for cough. , Disp: , Rfl:  .  Blood Glucose Monitoring Suppl (ONE TOUCH ULTRA MINI) w/Device KIT, 3 (three) times daily. for testing, Disp: , Rfl: 0 .  budesonide-formoterol (SYMBICORT) 160-4.5 MCG/ACT inhaler, Inhale 2 puffs into the lungs 2 (two) times  daily., Disp: , Rfl:  .  calcium carbonate (TUMS - DOSED IN MG ELEMENTAL CALCIUM) 500 MG chewable tablet, Chew 2 tablets daily as needed by mouth for indigestion or heartburn., Disp: , Rfl:  .  Cholecalciferol (VITAMIN D) 2000 UNITS tablet, Take 2,000 Units by mouth daily. , Disp: , Rfl:  .  clonazePAM (KLONOPIN) 0.5 MG tablet, Take 0.5 mg daily by mouth., Disp: , Rfl:  .  clopidogrel (PLAVIX) 75 MG tablet, Take 1 tablet (75 mg total) by mouth daily., Disp: 90 tablet, Rfl: 3 .  CREON 24000-76000 units CPEP, TAKE 2 CAPSULES BY MOUTH 3 TIMES A DAY WITH MEALS (TAK 1 CAPSULE WITH A SNACK), Disp: 210 capsule, Rfl: 1 .  diclofenac sodium (VOLTAREN) 1 % GEL, Apply 2 g topically 4 (four) times daily as needed (muscle pain)., Disp: , Rfl:  .  diltiazem (CARDIZEM CD) 240 MG 24 hr capsule, TAKE 1 CAPSULE BY MOUTH EVERY DAY, Disp: 90 capsule, Rfl: 2 .  ezetimibe (ZETIA) 10 MG tablet, Take 10 mg by mouth daily., Disp: , Rfl: 6 .  famotidine (PEPCID) 40 MG tablet, Take 1 tablet (40 mg total) by mouth at bedtime. (Patient taking differently: Take 40 mg by mouth daily as needed. ), Disp: 90 tablet, Rfl: 1 .  fexofenadine (ALLEGRA) 180 MG tablet, Take 1 tablet (180 mg total) by mouth daily. (Patient taking differently: Take 180 mg by mouth daily as needed. ), Disp: 30 tablet, Rfl: 6 .  fluticasone (FLONASE) 50 MCG/ACT nasal spray, Place 2 sprays into both nostrils daily., Disp: 16 g, Rfl: 6 .  Fluticasone-Umeclidin-Vilant (TRELEGY ELLIPTA) 100-62.5-25 MCG/INH AEPB, Inhale 1 puff into the lungs daily., Disp: 1 each, Rfl: 0 .  furosemide (LASIX) 40 MG tablet, Take 40 mg by mouth., Disp: , Rfl:  .  insulin lispro protamine-insulin lispro (HUMALOG 75/25) (75-25) 100 UNIT/ML SUSP, Inject 15-22 Units into the skin See admin instructions. Takes 15 units in the morning and 22  units with supper, Disp: , Rfl:  .  loperamide (IMODIUM) 2 MG capsule, Take 2 mg by mouth as needed for diarrhea or loose stools. , Disp: , Rfl:  .  meclizine (ANTIVERT) 25 MG tablet, Take 25 mg by mouth 2 (two) times daily as needed for dizziness. , Disp: , Rfl:  .  metoprolol succinate (TOPROL-XL) 100 MG 24 hr tablet, Take 1 tablet (100 mg total) by mouth daily., Disp: 90 tablet, Rfl: 1 .  montelukast (SINGULAIR) 5 MG chewable tablet, Chew 1 tablet (5 mg total) by mouth at bedtime., Disp: 30 tablet, Rfl: 3 .  Multiple Vitamin (MULTIVITAMIN WITH MINERALS) TABS, Take 1 tablet by mouth daily., Disp: , Rfl:  .  Nebulizers (COMPRESSOR/NEBULIZER) MISC, Use with albuterol, Disp: 1 each, Rfl: 0 .  nortriptyline (PAMELOR) 50 MG capsule, Take 1 capsule (50 mg total) by mouth at bedtime., Disp: 30 capsule, Rfl: 3 .  ONE TOUCH ULTRA TEST test strip, CHECK BLOOD SUGAR TWICE A DAY DX: E11.9, Disp: , Rfl: 3 .  ONETOUCH DELICA LANCETS 06H MISC, 3 (three) times daily. for testing, Disp: , Rfl: 0 .  pantoprazole (PROTONIX) 40 MG tablet, Take 1 tablet (40 mg total) by mouth 2 (two) times daily before a meal., Disp: 180 tablet, Rfl: 3 .  Spacer/Aero-Holding Chambers (AEROCHAMBER MV) inhaler, Use as instructed, Disp: 1 each, Rfl: 0 .  furosemide (LASIX) 40 MG tablet, Take 1 tablet (40 mg total) by mouth daily., Disp: 90 tablet, Rfl: 3 .  isosorbide mononitrate (IMDUR) 60 MG 24 hr tablet, Take 1.5 tablets (90 mg total) by mouth daily., Disp: 135 tablet, Rfl: 3 .  nitroGLYCERIN (NITROSTAT) 0.4 MG SL tablet, Place 0.4 mg under the tongue every 5 (five) minutes as needed for chest pain., Disp: , Rfl:   Current Facility-Administered Medications:  .  cyanocobalamin ((VITAMIN B-12)) injection 1,000 mcg, 1,000 mcg, Intramuscular, Q30 days, Binnie Rail, MD, 1,000 mcg at 03/26/16 1658

## 2017-10-26 NOTE — Patient Instructions (Signed)
Pulmonary nodule: We will make sure that you have a repeat CT scan arranged for November 2019  COPD-asthma overlap syndrome with acute exacerbation: We will prescribe an albuterol nebulizer for you to use at home, take albuterol 3 times a day for the next 2 to 3 days. If you have not had improved control of wheezing and shortness of breath call me so that I can call in a prescription for prednisone Keep taking Symbicort as you are doing High-dose flu shot today Practice good hand hygiene Stay active  We will see you back in 4-6 months or sooner if needed

## 2017-10-26 NOTE — Addendum Note (Signed)
Addended by: Dolores Lory on: 10/26/2017 02:38 PM   Modules accepted: Orders

## 2017-10-26 NOTE — Addendum Note (Signed)
Addended by: Dolores Lory on: 10/26/2017 02:36 PM   Modules accepted: Orders

## 2017-10-27 ENCOUNTER — Other Ambulatory Visit: Payer: Self-pay | Admitting: Neurology

## 2017-10-28 ENCOUNTER — Telehealth: Payer: Self-pay | Admitting: Pulmonary Disease

## 2017-10-28 NOTE — Telephone Encounter (Signed)
done

## 2017-10-28 NOTE — Telephone Encounter (Signed)
Faxed back to Valier at [facility] as requested and nothing more needed at this time. Forms sent to scan in Epic.

## 2017-10-28 NOTE — Telephone Encounter (Signed)
Spoke with Vida Roller at [facility]; states she faxed Rx for signature last night. Bettie jo-please make sure that BQ signs Rx today as [facility] needs this ASAP today. Thanks   Rx paperwork has been placed in BQ's look at [facility]

## 2017-11-01 ENCOUNTER — Ambulatory Visit: Payer: Medicare Other | Admitting: Neurology

## 2017-11-01 ENCOUNTER — Encounter

## 2017-11-02 ENCOUNTER — Ambulatory Visit: Payer: Medicare Other | Admitting: Neurology

## 2017-11-09 LAB — HM MAMMOGRAPHY

## 2017-11-11 ENCOUNTER — Ambulatory Visit: Payer: Medicare Other | Attending: Neurology | Admitting: Physical Therapy

## 2017-11-11 ENCOUNTER — Other Ambulatory Visit: Payer: Self-pay

## 2017-11-11 ENCOUNTER — Encounter: Payer: Self-pay | Admitting: Physical Therapy

## 2017-11-11 DIAGNOSIS — R2681 Unsteadiness on feet: Secondary | ICD-10-CM | POA: Diagnosis not present

## 2017-11-11 DIAGNOSIS — R2689 Other abnormalities of gait and mobility: Secondary | ICD-10-CM

## 2017-11-11 DIAGNOSIS — M6281 Muscle weakness (generalized): Secondary | ICD-10-CM | POA: Diagnosis present

## 2017-11-11 NOTE — Therapy (Signed)
[facility] [address] [place], [place], [address] Phone: [phone]   Fax:  [phone]  Physical Therapy Evaluation  Patient Details  Name: [name] MRN: [id number] Date of Birth: 1940/08/30 Referring Provider (PT): Metta Clines, DO   Encounter Date: 11/11/2017  PT End of Session - 11/11/17 1620    Visit Number  1    Number of Visits  17    Date for PT Re-Evaluation  01/06/18    Authorization Type  UHC Medicare    PT Start Time  1450    PT Stop Time  1530    PT Time Calculation (min)  40 min    Activity Tolerance  Patient tolerated treatment well    Behavior During Therapy  [facility] for tasks assessed/performed       Past Medical History:  Diagnosis Date  . Adenomatous colon polyp   . Allergy   . Anxiety   . Asthma   . Chest pain   . Chronic diastolic CHF (congestive heart failure) ([place])   . COPD (chronic obstructive pulmonary disease) ([place])    pt is unsure if has been officially diagnosed  . Coronary artery disease    CABG '09- cathed 12/09, 9/10, 6/11, 3/14 and 12/13/16- medical Rx  . Diabetes mellitus   . GERD (gastroesophageal reflux disease)   . Hiatal hernia   . Hyperlipidemia   . Hypertension   . Myocardial infarction ([place]) 2009  . PONV (postoperative nausea and vomiting)   . Schatzki's ring   . Shoulder injury    resolved after shoulder surgery  . Sleep apnea    not on cpap    Past Surgical History:  Procedure Laterality Date  . ABDOMINAL HYSTERECTOMY    . APPENDECTOMY     came out with Hysterectomy  . CARDIAC CATHETERIZATION  07/23/2009   EF 60%  . CARDIAC CATHETERIZATION  10/11/2008  . CARDIAC CATHETERIZATION  03/01/2007   EF 75-80%  . CARDIOVASCULAR STRESS TEST  11/15/2007   EF 60%  . COLONOSCOPY    . CORONARY ARTERY BYPASS GRAFT     SEVERELY DISEASED SAPHENOUS VEIN GRAFT TO THE RIGHT CORONARY ARTERY BUT WITH FAIRLY WELL PRESERVED FLOW TO THE DISTAL RIGHT CORONARY ARTERY FROM THE  NATIVE CIRCULATION-RESTART  CATH IN JUNE 2000, REVEALS MILD/MODERATE  CAD WITH GOOD FLOW DOWN HER LAD  . ESOPHAGOGASTRODUODENOSCOPY    . EYE SURGERY     bilateral cataract surgery with lens implant  . LEFT HEART CATH AND CORS/GRAFTS ANGIOGRAPHY N/A 12/14/2016   Procedure: LEFT HEART CATH AND CORS/GRAFTS ANGIOGRAPHY;  Surgeon: Jettie Booze, MD;  Location: [facility];  Service: Cardiovascular;  Laterality: N/A;  . lense removal Left   . POLYPECTOMY    . ROTATOR CUFF REPAIR     right and left  . TONSILLECTOMY     age 77  . 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 tumor removed kidney    . UPPER GASTROINTESTINAL ENDOSCOPY    . US ECHOCARDIOGRAPHY  03/08/2008   EF 55-60%    There were no vitals filed for this visit.   Subjective Assessment - 11/11/17 1450    Subjective  Patient reporting difficulty with her balance. Patient feels sometimes when shes walking/standing she has to grab onto something to keep her balance. Tried changing medications - no improvement. Diagnosed  with sleep apnea - had home CPAP machine, but no longer does - has not used CPAP in ~1 year. 2 falls in the past 6 months - fell out of car, 2nd time was walking out of church. Feels like balance is bad in the mornings. Bed is high but uses a stool. Denies lightheadedness. Only has dizziness with turning really quickly. Generally feels unsteady. Reports itching sensation in ears    Pertinent History  CABG x 3, OSA, history of smoking (quit 30 y/ago), asthma, diabetic polyneuropathy, vertigo, DM, COPD, HTN, CHF, cervicogenic headaches    Patient Stated Goals  "Hopefully, be able to improve balance."    Currently in Pain?  No/denies    Multiple Pain Sites  No         [facility] PT Assessment - 11/11/17 1506      Assessment   Medical Diagnosis  Unsteady gait    Referring  Provider (PT)  Metta Clines, DO    Prior Therapy  no      Precautions   Precautions  Fall      Restrictions   Weight Bearing Restrictions  No      Balance Screen   Has the patient fallen in the past 6 months  Yes    How many times?  2    Has the patient had a decrease in activity level because of a fear of falling?   No    Is the patient reluctant to leave their home because of a fear of falling?   No      Home Environment   Living Environment  Private residence    Living Arrangements  Alone   son temporarily at home   Type of [place] Access  Level entry    Home Layout  One level    [place] - single point    Additional Comments  does not use AD, carries it "everywhere"      Prior Function   Level of [place]  Retired   [employer], read; Returning to [employer] next week      Cognition   Overall Cognitive Status  Within Functional Limits for tasks assessed      Sensation   Light Touch  Appears Intact      Posture/Postural Control   Posture/Postural Control  Postural limitations    Postural Limitations  Rounded Shoulders;Forward head      ROM / Strength   AROM / PROM / Strength  Strength      Strength   Strength Assessment Site  Hip;Knee;Ankle    Right/Left Hip  Right;Left    Right Hip Flexion  4-/5    Left Hip Flexion  4/5    Right/Left Knee  Right;Left    Right Knee Flexion  5/5    Right Knee Extension  5/5    Left Knee Flexion  5/5    Left Knee Extension  5/5    Right/Left Ankle  Right;Left    Right Ankle Dorsiflexion  4/5    Left Ankle Dorsiflexion  4-/5      Transfers   Transfers  Sit to Stand    Sit to Stand  6: Modified independent (Device/Increase time);With upper extremity assist;Without upper extremity assist;From chair/3-in-1      Ambulation/Gait   Ambulation/Gait  Yes    Ambulation/Gait Assistance  6: Modified independent (Device/Increase time)    Ambulation Distance (Feet)  100 Feet     Assistive device  None    Gait Pattern  Step-through pattern;Decreased stride length;Trunk flexed;Narrow base of support    Ambulation Surface  Level;Indoor      Balance   Balance Assessed  Yes      Standardized Balance Assessment   Standardized Balance Assessment  Berg Balance Test      Berg Balance Test   Sit to Stand  Able to stand without using hands and stabilize independently    Standing Unsupported  Able to stand safely 2 minutes    Sitting with Back Unsupported but Feet Supported on Floor or Stool  Able to sit safely and securely 2 minutes    Stand to Sit  Sits safely with minimal use of hands    Transfers  Able to transfer safely, minor use of hands    Standing Unsupported with Eyes Closed  Able to stand 10 seconds with supervision    Standing Ubsupported with Feet Together  Able to place feet together independently and stand for 1 minute with supervision    From Standing, Reach Forward with Outstretched Arm  Can reach forward >12 cm safely (5")    From Standing Position, Pick up Object from [place] to pick up shoe safely and easily    From Standing Position, Turn to Look Behind Over each Shoulder  Looks behind from both sides and weight shifts well    Turn 360 Degrees  Able to turn 360 degrees safely but slowly    Standing Unsupported, Alternately Place Feet on Step/Stool  Able to stand independently and complete 8 steps >20 seconds    Standing Unsupported, One Foot in Front  Able to plae foot ahead of the other independently and hold 30 seconds    Standing on One Leg  Tries to lift leg/unable to hold 3 seconds but remains standing independently    Total Score  46      Functional Gait  Assessment   Gait assessed   Yes    Gait Level Surface  Walks 20 ft, slow speed, abnormal gait pattern, evidence for imbalance or deviates 10-15 in outside of the 12 in walkway width. Requires more than 7 sec to ambulate 20 ft.    Change in Gait Speed  Able to smoothly change walking  speed without loss of balance or gait deviation. Deviate no more than 6 in outside of the 12 in walkway width.    Gait with Horizontal Head Turns  Performs head turns with moderate changes in gait velocity, slows down, deviates 10-15 in outside 12 in walkway width but recovers, can continue to walk.    Gait with Vertical Head Turns  Performs task with slight change in gait velocity (eg, minor disruption to smooth gait path), deviates 6 - 10 in outside 12 in walkway width or uses assistive device    Gait and Pivot Turn  Pivot turns safely in greater than 3 sec and stops with no loss of balance, or pivot turns safely within 3 sec and stops with mild imbalance, requires small steps to catch balance.    Step Over Obstacle  Is able to step over one shoe box (4.5 in total height) but must slow down and adjust steps to clear box safely. May require verbal cueing.    Gait with Narrow Base of Support  Ambulates less than 4 steps heel to toe or cannot perform without assistance.    Gait with Eyes Closed  Walks 20 ft,  slow speed, abnormal gait pattern, evidence for imbalance, deviates 10-15 in outside 12 in walkway width. Requires more than 9 sec to ambulate 20 ft.    Ambulating Backwards  Walks 20 ft, slow speed, abnormal gait pattern, evidence for imbalance, deviates 10-15 in outside 12 in walkway width.    Steps  Alternating feet, must use rail.    Total Score  14                Objective measurements completed on examination: See above findings.              PT Education - 11/11/17 1619    Education Details  exam findings, fall risk, justification for skilled PT intervention    Person(s) Educated  Patient    Methods  Explanation    Comprehension  Verbalized understanding       PT Short Term Goals - 11/11/17 1645      PT SHORT TERM GOAL #1   Title  patient to be independent with initial HEP    Time  4    Period  Weeks    Status  New    Target Date  12/09/17      PT SHORT  TERM GOAL #2   Title  patient to improve Berg to >/= 50/56 demonstrating improved balance    Time  4    Period  Weeks    Status  New    Target Date  12/09/17      PT SHORT TERM GOAL #3   Title  Patient to improve FGA to >/= 17/24 demonstrating improved balance and functional mobility    Time  4    Period  Weeks    Status  New    Target Date  12/09/17        PT Long Term Goals - 11/11/17 1647      PT LONG TERM GOAL #1   Title  patient to be independent with advanced HEP for LE strength and balane    Time  8    Period  Weeks    Status  New    Target Date  01/06/18      PT LONG TERM GOAL #2   Title  Patient to report return to [facility] 3-5x/week for cardiovascular endurance, continued strengthening, and integration into community wellness    Time  8    Period  Weeks    Status  New    Target Date  01/06/18      PT LONG TERM GOAL #3   Title  patient to improve FGA to 21/24 demonstrating reduced fall risk    Time  8    Period  Weeks    Status  New    Target Date  01/06/18      PT LONG TERM GOAL #4   Title  vestibular assessment and goals as needed    Time  8    Period  Weeks    Status  New    Target Date  01/06/18             Plan - 11/11/17 1628    Clinical Impression Statement  Ms. Caraveo is a very pleasant 77 y/o female presenting to [facility] today regarding primary complaints of unsteadiness and balance deficits with 2 falls in the past 6 months. Patient reporting balance complaints with getting up in the morning and general mobility with feelings of need to hold onto object for stbaility. Patient today scoring 46/56 on Berg  and 14/24 on FGA, demonstrating moderate and high fall risk, respectively. After discussion with patient, more in depth vestibular assessment may need to be performed at upcoming visits to rull out inner ear deficits. Patient to benefit from skilled PT intervention to address balance, gait deficits, and fall risk.     History and Personal Factors  relevant to plan of care:  CABG x 3, OSA, history of smoking (quit 30 y/ago), asthma, diabetic polyneuropathy, vertigo, DM, COPD, HTN, CHF, cervicogenic headaches    Clinical Presentation  Evolving    Clinical Presentation due to:  CABG x 3, OSA, history of smoking (quit 30 y/ago), asthma, diabetic polyneuropathy, vertigo, DM, COPD, HTN, CHF, cervicogenic headaches    Clinical Decision Making  Moderate    Rehab Potential  Good    PT Frequency  2x / week    PT Duration  8 weeks    PT Treatment/Interventions  ADLs/Self Care Home Management;Canalith Repostioning;Therapeutic exercise;Therapeutic activities;Functional mobility training;Stair training;Gait training;Balance training;Neuromuscular re-education;Patient/family education;Manual techniques;Vestibular;Passive range of motion;Visual/perceptual remediation/compensation    PT Next Visit Plan  may need vestibular assessment, initiate HEP for balance and strengthening    Consulted and Agree with Plan of Care  Patient       Patient will benefit from skilled therapeutic intervention in order to improve the following deficits and impairments:  Decreased balance, Dizziness, Abnormal gait, Decreased strength  Visit Diagnosis: Unsteadiness on feet - Plan: PT plan of care cert/re-cert  Other abnormalities of gait and mobility - Plan: PT plan of care cert/re-cert  Muscle weakness (generalized) - Plan: PT plan of care cert/re-cert     Problem List Patient Active Problem List   Diagnosis Date Noted  . Arthralgia 08/01/2017  . COPD with asthma ([place]) 05/04/2017  . Angina pectoris ([place]) 01/05/2017  . Insomnia 11/23/2016  . Chronic nonintractable headache 11/23/2016  . Pancreatic insufficiency 10/28/2016  . Carotid artery disease ([place]) 05/19/2016  . Fatigue 05/19/2016  . Anemia 03/27/2016  . Severe persistent asthma 03/18/2016  . Type 1 diabetes mellitus ([place]) 07/21/2015  . B12 deficiency 03/31/2015  . OSA (obstructive sleep apnea)  11/20/2014  . Fecal incontinence 08/28/2014  . 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 Falls 08/28/2014  . Neck pain of over 3 months duration 08/28/2014  . H/O total knee replacement 02/20/2014  . Obesity (BMI 30-39.9) 11/12/2013  . Chronic diastolic CHF (congestive heart failure) ([place]) 11/17/2012  . Meniscus, lateral, anterior horn derangement 11/10/2011  . Medial meniscus, posterior horn derangement 11/10/2011  . Osteoarthritis of right knee 11/10/2011  . Vertigo 10/05/2011  . HTN (hypertension), benign 10/05/2011  . Coronary artery disease 08/27/2010  . MUSCLE CRAMPS 12/29/2009  . History of myocardial infarction 11/19/2009  . Extrinsic asthma 11/19/2009  . GERD 11/19/2009  . Cough 11/19/2009  . Hyperlipidemia 11/18/2009     Lanney Gins, PT, DPT Supplemental Physical Therapist 11/11/17 4:50 PM Pager: [phone] Office: [facility] [address] [place], [place], [address] Phone: [phone]   Fax:  [phone]  Name: MALU PELLEGRINI MRN: [id number] Date of Birth: [birth year]

## 2017-11-14 ENCOUNTER — Other Ambulatory Visit: Payer: Self-pay | Admitting: Internal Medicine

## 2017-11-16 ENCOUNTER — Encounter: Payer: Self-pay | Admitting: Internal Medicine

## 2017-11-30 ENCOUNTER — Encounter: Payer: Self-pay | Admitting: Physical Therapy

## 2017-11-30 ENCOUNTER — Ambulatory Visit: Payer: Medicare Other | Admitting: Physical Therapy

## 2017-11-30 DIAGNOSIS — R2681 Unsteadiness on feet: Secondary | ICD-10-CM

## 2017-11-30 DIAGNOSIS — R2689 Other abnormalities of gait and mobility: Secondary | ICD-10-CM

## 2017-11-30 DIAGNOSIS — M6281 Muscle weakness (generalized): Secondary | ICD-10-CM

## 2017-11-30 NOTE — Therapy (Signed)
[facility] [address] [place], [place], [address] Phone: [phone]   Fax:  [phone]  Physical Therapy Treatment  Patient Details  Name: [name] MRN: [id number] Date of Birth: [birth year] Referring Provider (PT): Metta Clines, DO   Encounter Date: 11/30/2017  PT End of Session - 11/30/17 1647    Visit Number  2    Number of Visits  17    Date for PT Re-Evaluation  01/06/18    Authorization Type  UHC Medicare    PT Start Time  1536    PT Stop Time  1615    PT Time Calculation (min)  39 min    Activity Tolerance  Patient tolerated treatment well    Behavior During Therapy  [facility] for tasks assessed/performed       Past Medical History:  Diagnosis Date  . Adenomatous colon polyp   . Allergy   . Anxiety   . Asthma   . Chest pain   . Chronic diastolic CHF (congestive heart failure) ([place])   . COPD (chronic obstructive pulmonary disease) ([place])    pt is unsure if has been officially diagnosed  . Coronary artery disease    CABG '09- cathed 12/09, 9/10, 6/11, 3/14 and 12/13/16- medical Rx  . Diabetes mellitus   . GERD (gastroesophageal reflux disease)   . Hiatal hernia   . Hyperlipidemia   . Hypertension   . Myocardial infarction ([place]) 2009  . PONV (postoperative nausea and vomiting)   . Schatzki's ring   . Shoulder injury    resolved after shoulder surgery  . Sleep apnea    not on cpap    Past Surgical History:  Procedure Laterality Date  . ABDOMINAL HYSTERECTOMY    . APPENDECTOMY     came out with Hysterectomy  . CARDIAC CATHETERIZATION  07/23/2009   EF 60%  . CARDIAC CATHETERIZATION  10/11/2008  . CARDIAC CATHETERIZATION  03/01/2007   EF 75-80%  . CARDIOVASCULAR STRESS TEST  11/15/2007   EF 60%  . COLONOSCOPY    . CORONARY ARTERY BYPASS GRAFT     SEVERELY DISEASED SAPHENOUS VEIN GRAFT TO THE RIGHT CORONARY ARTERY BUT WITH FAIRLY WELL PRESERVED FLOW TO THE DISTAL RIGHT CORONARY ARTERY FROM THE  NATIVE CIRCULATION-RESTART  CATH IN JUNE 2000, REVEALS MILD/MODERATE  CAD WITH GOOD FLOW DOWN HER LAD  . ESOPHAGOGASTRODUODENOSCOPY    . EYE SURGERY     bilateral cataract surgery with lens implant  . LEFT HEART CATH AND CORS/GRAFTS ANGIOGRAPHY N/A 12/14/2016   Procedure: LEFT HEART CATH AND CORS/GRAFTS ANGIOGRAPHY;  Surgeon: Jettie Booze, MD;  Location: [facility];  Service: Cardiovascular;  Laterality: N/A;  . lense removal Left   . POLYPECTOMY    . ROTATOR CUFF REPAIR     right and left  . TONSILLECTOMY     age 77  . 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 tumor removed kidney    . UPPER GASTROINTESTINAL ENDOSCOPY    . US ECHOCARDIOGRAPHY  03/08/2008   EF 55-60%    There were no vitals filed for this visit.  Subjective Assessment - 11/30/17 1647    Subjective  doing well - no falls; still feels like she is going to fall forward    Pertinent History  CABG x 3, OSA, history of smoking (quit 30 y/ago),  asthma, diabetic polyneuropathy, vertigo, DM, COPD, HTN, CHF, cervicogenic headaches    Patient Stated Goals  "Hopefully, be able to improve balance."    Currently in Pain?  No/denies    Multiple Pain Sites  No             Vestibular Assessment - 11/30/17 0001      Balancemaster   Engineer, materials Comment  composite score of 42 - below age related norms, 3 falls on condition 5              Neuro re-ed: sensory organization test performed with following results: Conditions: 1:  3 trials above age related norms 2:  3 trials above age related norms 3:  3 trials below age related norms 4: 3 trials below age related norms 5: 3 falls 6: 1 fall, 2 trails above age related norms Composite score:  29 - below age related norms Sensory Analysis Som: below Vis: below Vest:  below - relatively "0" Pref: above Strategy analysis:    Primarily hip dominant, 4 total falls   COG alignment:      Near center            PT Education - 11/30/17 1652    Education Details  education on SOT findings and when PT sessions need to focus; patient concerned that prior MRI may have missed something that was cuasing her vestibular deficits on SOT - reassured her that sometimes this happend with age/reduced practice.    Person(s) Educated  Patient    Methods  Explanation;Handout    Comprehension  Verbalized understanding       PT Short Term Goals - 11/30/17 1702      PT SHORT TERM GOAL #1   Title  patient to be independent with initial HEP    Time  4    Period  Weeks    Status  New      PT SHORT TERM GOAL #2   Title  patient to improve Berg to >/= 50/56 demonstrating improved balance    Time  4    Period  Weeks    Status  New      PT SHORT TERM GOAL #3   Title  Patient to improve FGA to >/= 17/24 demonstrating improved balance and functional mobility    Time  4    Period  Weeks    Status  New      PT SHORT TERM GOAL #4   Title  patient to maintain EC on compliant surfaces for up to 20 seconds without "fall"    Time  4    Period  Weeks    Status  New    Target Date  12/09/17        PT Long Term Goals - 11/30/17 1648      PT LONG TERM GOAL #1   Title  patient to be independent with advanced HEP for LE strength and balane    Time  8    Period  Weeks    Status  New      PT LONG TERM GOAL #2   Title  Patient to report return to [facility] 3-5x/week for cardiovascular endurance, continued strengthening, and integration into community wellness    Time  8    Period  Weeks    Status  New      PT LONG TERM GOAL #3   Title  patient to improve FGA to 21/24 demonstrating  reduced fall risk    Time  8    Period  Weeks    Status  New      PT LONG TERM GOAL #4   Title  vestibular assessment and goals as needed    Time  8    Period  Weeks    Status  New       PT LONG TERM GOAL #5   Title  patient to improve SOT composite score to within age related norms    Time  8    Period  Weeks    Status  New    Target Date  01/06/18            Plan - 11/30/17 1648    Clinical Impression Statement  Session today spent assessing SOT - patient demonstrating great difficulty with conditions that challenged vestibular input with condition 5 - scoring 3 falls. Much time spent educating patient on balance organs and inputs with reassurance provided that she is not "abnormal" or unhealhty as patient was concerned regarding this. Discussion on need to continue to focus PT sessions on balance activities that will stress vestibular input for greatest benefit and carryover into the home and community with good verbal understanding.     Rehab Potential  Good    PT Frequency  2x / week    PT Duration  8 weeks    PT Treatment/Interventions  ADLs/Self Care Home Management;Canalith Repostioning;Therapeutic exercise;Therapeutic activities;Functional mobility training;Stair training;Gait training;Balance training;Neuromuscular re-education;Patient/family education;Manual techniques;Vestibular;Passive range of motion;Visual/perceptual remediation/compensation    PT Next Visit Plan  may need vestibular assessment, initiate HEP for balance and strengthening    Consulted and Agree with Plan of Care  Patient       Patient will benefit from skilled therapeutic intervention in order to improve the following deficits and impairments:  Decreased balance, Dizziness, Abnormal gait, Decreased strength  Visit Diagnosis: Unsteadiness on feet  Other abnormalities of gait and mobility  Muscle weakness (generalized)     Problem List Patient Active Problem List   Diagnosis Date Noted  . Arthralgia 08/01/2017  . COPD with asthma ([place]) 05/04/2017  . Angina pectoris ([place]) 01/05/2017  . Insomnia 11/23/2016  . Chronic nonintractable headache 11/23/2016  . Pancreatic  insufficiency 10/28/2016  . Carotid artery disease ([place]) 05/19/2016  . Fatigue 05/19/2016  . Anemia 03/27/2016  . Severe persistent asthma 03/18/2016  . Type 1 diabetes mellitus ([place]) 07/21/2015  . B12 deficiency 03/31/2015  . OSA (obstructive sleep apnea) 11/20/2014  . Fecal incontinence 08/28/2014  . 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 Falls 08/28/2014  . Neck pain of over 3 months duration 08/28/2014  . H/O total knee replacement 02/20/2014  . Obesity (BMI 30-39.9) 11/12/2013  . Chronic diastolic CHF (congestive heart failure) ([place]) 11/17/2012  . Meniscus, lateral, anterior horn derangement 11/10/2011  . Medial meniscus, posterior horn derangement 11/10/2011  . Osteoarthritis of right knee 11/10/2011  . Vertigo 10/05/2011  . HTN (hypertension), benign 10/05/2011  . Coronary artery disease 08/27/2010  . MUSCLE CRAMPS 12/29/2009  . History of myocardial infarction 11/19/2009  . Extrinsic asthma 11/19/2009  . GERD 11/19/2009  . Cough 11/19/2009  . Hyperlipidemia 11/18/2009     Lanney Gins, PT, DPT Supplemental Physical Therapist 11/30/17 5:03 PM Pager: [phone] Office: [facility] [address] [place], [place], [address] Phone: [phone]   Fax:  [phone]  Name: LILLIONNA NABI MRN: [id number] Date of Birth: 02/05/1941

## 2017-12-02 ENCOUNTER — Encounter: Payer: Self-pay | Admitting: Physical Therapy

## 2017-12-02 ENCOUNTER — Ambulatory Visit: Payer: Medicare Other | Admitting: Physical Therapy

## 2017-12-02 DIAGNOSIS — R2681 Unsteadiness on feet: Secondary | ICD-10-CM

## 2017-12-02 DIAGNOSIS — M6281 Muscle weakness (generalized): Secondary | ICD-10-CM

## 2017-12-02 DIAGNOSIS — R2689 Other abnormalities of gait and mobility: Secondary | ICD-10-CM

## 2017-12-02 NOTE — Therapy (Signed)
[facility] [address] [place], [place], [address] Phone: [phone]   Fax:  [phone]  Physical Therapy Treatment  Patient Details  Name: [name] MRN: [id number] Date of Birth: 21-Mar-1940 Referring Provider (PT): Metta Clines, DO   Encounter Date: 12/02/2017  PT End of Session - 12/02/17 1537    Visit Number  3    Number of Visits  17    Date for PT Re-Evaluation  01/06/18    Authorization Type  UHC Medicare    PT Start Time  1534    PT Stop Time  1616    PT Time Calculation (min)  42 min    Activity Tolerance  Patient tolerated treatment well    Behavior During Therapy  [facility] for tasks assessed/performed       Past Medical History:  Diagnosis Date  . Adenomatous colon polyp   . Allergy   . Anxiety   . Asthma   . Chest pain   . Chronic diastolic CHF (congestive heart failure) ([place])   . COPD (chronic obstructive pulmonary disease) ([place])    pt is unsure if has been officially diagnosed  . Coronary artery disease    CABG '09- cathed 12/09, 9/10, 6/11, 3/14 and 12/13/16- medical Rx  . Diabetes mellitus   . GERD (gastroesophageal reflux disease)   . Hiatal hernia   . Hyperlipidemia   . Hypertension   . Myocardial infarction ([place]) 2009  . PONV (postoperative nausea and vomiting)   . Schatzki's ring   . Shoulder injury    resolved after shoulder surgery  . Sleep apnea    not on cpap    Past Surgical History:  Procedure Laterality Date  . ABDOMINAL HYSTERECTOMY    . APPENDECTOMY     came out with Hysterectomy  . CARDIAC CATHETERIZATION  07/23/2009   EF 60%  . CARDIAC CATHETERIZATION  10/11/2008  . CARDIAC CATHETERIZATION  03/01/2007   EF 75-80%  . CARDIOVASCULAR STRESS TEST  11/15/2007   EF 60%  . COLONOSCOPY    . CORONARY ARTERY BYPASS GRAFT     SEVERELY DISEASED SAPHENOUS VEIN GRAFT TO THE RIGHT CORONARY ARTERY BUT WITH FAIRLY WELL PRESERVED FLOW TO THE DISTAL RIGHT CORONARY ARTERY FROM THE  NATIVE CIRCULATION-RESTART  CATH IN JUNE 2000, REVEALS MILD/MODERATE  CAD WITH GOOD FLOW DOWN HER LAD  . ESOPHAGOGASTRODUODENOSCOPY    . EYE SURGERY     bilateral cataract surgery with lens implant  . LEFT HEART CATH AND CORS/GRAFTS ANGIOGRAPHY N/A 12/14/2016   Procedure: LEFT HEART CATH AND CORS/GRAFTS ANGIOGRAPHY;  Surgeon: Jettie Booze, MD;  Location: [facility];  Service: Cardiovascular;  Laterality: N/A;  . lense removal Left   . POLYPECTOMY    . ROTATOR CUFF REPAIR     right and left  . TONSILLECTOMY     age 30  . 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 tumor removed kidney    . UPPER GASTROINTESTINAL ENDOSCOPY    . US ECHOCARDIOGRAPHY  03/08/2008   EF 55-60%    There were no vitals filed for this visit.  Subjective Assessment - 12/02/17 1536    Subjective  "I've been doing everything in slow motion today" - feels a little short of breath - used an inhaler and nebulizer today    Pertinent History  CABG x  3, OSA, history of smoking (quit 30 y/ago), asthma, diabetic polyneuropathy, vertigo, DM, COPD, HTN, CHF, cervicogenic headaches    Patient Stated Goals  "Hopefully, be able to improve balance."    Currently in Pain?  No/denies    Multiple Pain Sites  No                            Balance Exercises - 12/02/17 1538      Balance Exercises: Standing   Standing Eyes Opened  Narrow base of support (BOS);Head turns;2 reps;30 secs   horizontal and vertical   Standing Eyes Closed  Narrow base of support (BOS);3 reps;30 secs    Tandem Stance  Eyes open;3 reps;30 secs;Intermittent upper extremity support    Balance Beam  black beam: fwd/bwd step offs x 8 each LE/direction    Gait with Head Turns  Forward   2 laps around gym; horizontal and vertical head turns   Tandem Gait  Forward;Retro;4 reps   in //  bars   Other Standing Exercises  toe taps to 6" step standing on [employer] x 15 each LE; stnading on ramp (up and down) with feet together and eyes closed 3 x 30 sec          PT Short Term Goals - 11/30/17 1702      PT SHORT TERM GOAL #1   Title  patient to be independent with initial HEP    Time  4    Period  Weeks    Status  New      PT SHORT TERM GOAL #2   Title  patient to improve Berg to >/= 50/56 demonstrating improved balance    Time  4    Period  Weeks    Status  New      PT SHORT TERM GOAL #3   Title  Patient to improve FGA to >/= 17/24 demonstrating improved balance and functional mobility    Time  4    Period  Weeks    Status  New      PT SHORT TERM GOAL #4   Title  patient to maintain EC on compliant surfaces for up to 20 seconds without "fall"    Time  4    Period  Weeks    Status  New    Target Date  12/09/17        PT Long Term Goals - 11/30/17 1648      PT LONG TERM GOAL #1   Title  patient to be independent with advanced HEP for LE strength and balane    Time  8    Period  Weeks    Status  New      PT LONG TERM GOAL #2   Title  Patient to report return to [facility] 3-5x/week for cardiovascular endurance, continued strengthening, and integration into community wellness    Time  8    Period  Weeks    Status  New      PT LONG TERM GOAL #3   Title  patient to improve FGA to 21/24 demonstrating reduced fall risk    Time  8    Period  Weeks    Status  New      PT LONG TERM GOAL #4   Title  vestibular assessment and goals as needed    Time  8    Period  Weeks    Status  New  PT LONG TERM GOAL #5   Title  patient to improve SOT composite score to within age related norms    Time  8    Period  Weeks    Status  New    Target Date  01/06/18            Plan - 12/02/17 1537    Clinical Impression Statement  Session today focusing on balance task to challenge vestibular system and initiating HEP for balance activities at home. Patient  reporitng reduced confidence in session, however performing all activities without issue or LOB. Emphasis on safety with home HEP with good verbal understanding.     Rehab Potential  Good    PT Frequency  2x / week    PT Duration  8 weeks    PT Treatment/Interventions  ADLs/Self Care Home Management;Canalith Repostioning;Therapeutic exercise;Therapeutic activities;Functional mobility training;Stair training;Gait training;Balance training;Neuromuscular re-education;Patient/family education;Manual techniques;Vestibular;Passive range of motion;Visual/perceptual remediation/compensation    PT Next Visit Plan  may need vestibular assessment, initiate HEP for balance and strengthening    Consulted and Agree with Plan of Care  Patient       Patient will benefit from skilled therapeutic intervention in order to improve the following deficits and impairments:  Decreased balance, Dizziness, Abnormal gait, Decreased strength  Visit Diagnosis: Unsteadiness on feet  Other abnormalities of gait and mobility  Muscle weakness (generalized)     Problem List Patient Active Problem List   Diagnosis Date Noted  . Arthralgia 08/01/2017  . COPD with asthma ([place]) 05/04/2017  . Angina pectoris ([place]) 01/05/2017  . Insomnia 11/23/2016  . Chronic nonintractable headache 11/23/2016  . Pancreatic insufficiency 10/28/2016  . Carotid artery disease ([place]) 05/19/2016  . Fatigue 05/19/2016  . Anemia 03/27/2016  . Severe persistent asthma 03/18/2016  . Type 1 diabetes mellitus ([place]) 07/21/2015  . B12 deficiency 03/31/2015  . OSA (obstructive sleep apnea) 11/20/2014  . Fecal incontinence 08/28/2014  . 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 Falls 08/28/2014  . Neck pain of over 3 months duration 08/28/2014  . H/O total knee replacement 02/20/2014  . Obesity (BMI 30-39.9) 11/12/2013  . Chronic diastolic CHF (congestive heart failure) ([place]) 11/17/2012  . Meniscus, lateral, anterior horn derangement  11/10/2011  . Medial meniscus, posterior horn derangement 11/10/2011  . Osteoarthritis of right knee 11/10/2011  . Vertigo 10/05/2011  . HTN (hypertension), benign 10/05/2011  . Coronary artery disease 08/27/2010  . MUSCLE CRAMPS 12/29/2009  . History of myocardial infarction 11/19/2009  . Extrinsic asthma 11/19/2009  . GERD 11/19/2009  . Cough 11/19/2009  . Hyperlipidemia 11/18/2009     Lanney Gins, PT, DPT Supplemental Physical Therapist 12/02/17 4:39 PM Pager: [phone] Office: [facility] [address] [place], [place], [address] Phone: [phone]   Fax:  [phone]  Name: [name] MRN: [id number] Date of Birth: 02-28-1940

## 2017-12-07 ENCOUNTER — Encounter: Payer: Self-pay | Admitting: Physical Therapy

## 2017-12-07 ENCOUNTER — Ambulatory Visit: Payer: Medicare Other | Admitting: Physical Therapy

## 2017-12-07 DIAGNOSIS — M6281 Muscle weakness (generalized): Secondary | ICD-10-CM

## 2017-12-07 DIAGNOSIS — R2681 Unsteadiness on feet: Secondary | ICD-10-CM | POA: Diagnosis not present

## 2017-12-07 DIAGNOSIS — R2689 Other abnormalities of gait and mobility: Secondary | ICD-10-CM

## 2017-12-07 NOTE — Therapy (Signed)
[facility] [address] [place], [place], [address] Phone: [phone]   Fax:  [phone]  Physical Therapy Treatment  Patient Details  Name: [name] MRN: [id number] Date of Birth: 1940-12-10 Referring Provider (PT): Metta Clines, DO   Encounter Date: 12/07/2017  PT End of Session - 12/07/17 1601    Visit Number  4    Number of Visits  17    Date for PT Re-Evaluation  01/06/18    Authorization Type  UHC Medicare    PT Start Time  1451    PT Stop Time  1532    PT Time Calculation (min)  41 min    Equipment Utilized During Treatment  Gait belt    Activity Tolerance  Patient tolerated treatment well    Behavior During Therapy  [facility] for tasks assessed/performed       Past Medical History:  Diagnosis Date  . Adenomatous colon polyp   . Allergy   . Anxiety   . Asthma   . Chest pain   . Chronic diastolic CHF (congestive heart failure) ([place])   . COPD (chronic obstructive pulmonary disease) ([place])    pt is unsure if has been officially diagnosed  . Coronary artery disease    CABG '09- cathed 12/09, 9/10, 6/11, 3/14 and 12/13/16- medical Rx  . Diabetes mellitus   . GERD (gastroesophageal reflux disease)   . Hiatal hernia   . Hyperlipidemia   . Hypertension   . Myocardial infarction ([place]) 2009  . PONV (postoperative nausea and vomiting)   . Schatzki's ring   . Shoulder injury    resolved after shoulder surgery  . Sleep apnea    not on cpap    Past Surgical History:  Procedure Laterality Date  . ABDOMINAL HYSTERECTOMY    . APPENDECTOMY     came out with Hysterectomy  . CARDIAC CATHETERIZATION  07/23/2009   EF 60%  . CARDIAC CATHETERIZATION  10/11/2008  . CARDIAC CATHETERIZATION  03/01/2007   EF 75-80%  . CARDIOVASCULAR STRESS TEST  11/15/2007   EF 60%  . COLONOSCOPY    . CORONARY ARTERY BYPASS GRAFT     SEVERELY DISEASED SAPHENOUS VEIN GRAFT TO THE RIGHT CORONARY ARTERY BUT WITH FAIRLY WELL PRESERVED  FLOW TO THE DISTAL RIGHT CORONARY ARTERY FROM THE NATIVE CIRCULATION-RESTART  CATH IN JUNE 2000, REVEALS MILD/MODERATE  CAD WITH GOOD FLOW DOWN HER LAD  . ESOPHAGOGASTRODUODENOSCOPY    . EYE SURGERY     bilateral cataract surgery with lens implant  . LEFT HEART CATH AND CORS/GRAFTS ANGIOGRAPHY N/A 12/14/2016   Procedure: LEFT HEART CATH AND CORS/GRAFTS ANGIOGRAPHY;  Surgeon: Jettie Booze, MD;  Location: [facility];  Service: Cardiovascular;  Laterality: N/A;  . lense removal Left   . POLYPECTOMY    . ROTATOR CUFF REPAIR     right and left  . TONSILLECTOMY     age 77  . 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 tumor removed kidney    . UPPER GASTROINTESTINAL ENDOSCOPY    . US ECHOCARDIOGRAPHY  03/08/2008   EF 55-60%    There were no vitals filed for this visit.  Subjective Assessment - 12/07/17 1558    Subjective  Pt stated she did not do well on the tandem stance home exercise. She has worked up to Building services engineer  for 7 minutes.     Pertinent History  CABG x 3, OSA, history of smoking (quit 30 y/ago), asthma, diabetic polyneuropathy, vertigo, DM, COPD, HTN, CHF, cervicogenic headaches    Patient Stated Goals  "Hopefully, be able to improve balance."                       [facility] Adult PT Treatment/Exercise - 12/07/17 1539      High Level Balance   High Level Balance Comments  --          Balance Exercises - 12/07/17 1546      Balance Exercises: Standing   Standing Eyes Opened  Narrow base of support (BOS);Wide (BOA);Foam/compliant surface;4 reps   x1 wide BOS; x3 narrow BOS for 30 sec.    Gait with Head Turns  Forward   3 laps; horizontal and verticle head turns, scanning   Sidestepping  --    Other Standing Exercises  Alternating foward and lateral steps off and on airex pad with occasional UE support  requiring Supervision and cues to increase step length; ant-post weight shifts on airex with min A and cues for proper technique;         PT Education - 12/07/17 1616    Education Details  Pt educated on normal balance findings for her age and how she compares.      Person(s) Educated  Patient    Methods  Explanation    Comprehension  Verbalized understanding       PT Short Term Goals - 11/30/17 1702      PT SHORT TERM GOAL #1   Title  patient to be independent with initial HEP    Time  4    Period  Weeks    Status  New      PT SHORT TERM GOAL #2   Title  patient to improve Berg to >/= 50/56 demonstrating improved balance    Time  4    Period  Weeks    Status  New      PT SHORT TERM GOAL #3   Title  Patient to improve FGA to >/= 17/24 demonstrating improved balance and functional mobility    Time  4    Period  Weeks    Status  New      PT SHORT TERM GOAL #4   Title  patient to maintain EC on compliant surfaces for up to 20 seconds without "fall"    Time  4    Period  Weeks    Status  New    Target Date  12/09/17        PT Long Term Goals - 11/30/17 1648      PT LONG TERM GOAL #1   Title  patient to be independent with advanced HEP for LE strength and balane    Time  8    Period  Weeks    Status  New      PT LONG TERM GOAL #2   Title  Patient to report return to [facility] 3-5x/week for cardiovascular endurance, continued strengthening, and integration into community wellness    Time  8    Period  Weeks    Status  New      PT LONG TERM GOAL #3   Title  patient to improve FGA to 21/24 demonstrating reduced fall risk    Time  8    Period  Weeks    Status  New  PT LONG TERM GOAL #4   Title  vestibular assessment and goals as needed    Time  8    Period  Weeks    Status  New      PT LONG TERM GOAL #5   Title  patient to improve SOT composite score to within age related norms    Time  8    Period  Weeks    Status  New    Target Date  01/06/18             Plan - 12/07/17 1602    Clinical Impression Statement  Today's session focused on balance activites. Pt was able to tolerate advanced balance exercises on airex pad and stepping strategies. Pt is improving with dymanic balance despite her fear of falling and discouragement with her improvement.      Rehab Potential  Good    PT Frequency  2x / week    PT Duration  8 weeks    PT Treatment/Interventions  ADLs/Self Care Home Management;Canalith Repostioning;Therapeutic exercise;Therapeutic activities;Functional mobility training;Stair training;Gait training;Balance training;Neuromuscular re-education;Patient/family education;Manual techniques;Vestibular;Passive range of motion;Visual/perceptual remediation/compensation    PT Next Visit Plan  may need vestibular assessment, initiate HEP for balance and strengthening    Consulted and Agree with Plan of Care  Patient       Patient will benefit from skilled therapeutic intervention in order to improve the following deficits and impairments:  Decreased balance, Dizziness, Abnormal gait, Decreased strength  Visit Diagnosis: Unsteadiness on feet  Other abnormalities of gait and mobility  Muscle weakness (generalized)     Problem List Patient Active Problem List   Diagnosis Date Noted  . Arthralgia 08/01/2017  . COPD with asthma ([place]) 05/04/2017  . Angina pectoris ([place]) 01/05/2017  . Insomnia 11/23/2016  . Chronic nonintractable headache 11/23/2016  . Pancreatic insufficiency 10/28/2016  . Carotid artery disease ([place]) 05/19/2016  . Fatigue 05/19/2016  . Anemia 03/27/2016  . Severe persistent asthma 03/18/2016  . Type 1 diabetes mellitus ([place]) 07/21/2015  . B12 deficiency 03/31/2015  . OSA (obstructive sleep apnea) 11/20/2014  . Fecal incontinence 08/28/2014  . 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 Falls 08/28/2014  . Neck pain of over 3 months duration 08/28/2014  . H/O total knee replacement 02/20/2014  . Obesity  (BMI 30-39.9) 11/12/2013  . Chronic diastolic CHF (congestive heart failure) ([place]) 11/17/2012  . Meniscus, lateral, anterior horn derangement 11/10/2011  . Medial meniscus, posterior horn derangement 11/10/2011  . Osteoarthritis of right knee 11/10/2011  . Vertigo 10/05/2011  . HTN (hypertension), benign 10/05/2011  . Coronary artery disease 08/27/2010  . MUSCLE CRAMPS 12/29/2009  . History of myocardial infarction 11/19/2009  . Extrinsic asthma 11/19/2009  . GERD 11/19/2009  . Cough 11/19/2009  . Hyperlipidemia 11/18/2009    Cecile Sheerer, SPTA 12/07/2017, 4:18 PM  [facility] [address] [place], [place], [address] Phone: [phone]   Fax:  [phone]  Name: THYRA YINGER MRN: [id number] Date of Birth: 10/07/1940

## 2017-12-09 ENCOUNTER — Ambulatory Visit: Payer: Medicare Other | Attending: Neurology | Admitting: Physical Therapy

## 2017-12-09 ENCOUNTER — Encounter: Payer: Self-pay | Admitting: Physical Therapy

## 2017-12-09 DIAGNOSIS — R2689 Other abnormalities of gait and mobility: Secondary | ICD-10-CM | POA: Insufficient documentation

## 2017-12-09 DIAGNOSIS — R2681 Unsteadiness on feet: Secondary | ICD-10-CM | POA: Insufficient documentation

## 2017-12-09 DIAGNOSIS — M6281 Muscle weakness (generalized): Secondary | ICD-10-CM | POA: Insufficient documentation

## 2017-12-09 NOTE — Therapy (Signed)
[facility] [address] [place], [place], [address] Phone: [phone]   Fax:  [phone]  Physical Therapy Treatment  Patient Details  Name: [name] MRN: [id number] Date of Birth: Aug 22, 1940 Referring Provider (PT): Metta Clines, DO   Encounter Date: 12/09/2017  PT End of Session - 12/09/17 1407    Visit Number  5    Number of Visits  17    Date for PT Re-Evaluation  01/06/18    Authorization Type  [employer] Medicare - 10th visit PN    PT Start Time  1315    PT Stop Time  1400    PT Time Calculation (min)  45 min    Activity Tolerance  Patient tolerated treatment well    Behavior During Therapy  [facility] for tasks assessed/performed       Past Medical History:  Diagnosis Date  . Adenomatous colon polyp   . Allergy   . Anxiety   . Asthma   . Chest pain   . Chronic diastolic CHF (congestive heart failure) ([place])   . COPD (chronic obstructive pulmonary disease) ([place])    pt is unsure if has been officially diagnosed  . Coronary artery disease    CABG '09- cathed 12/09, 9/10, 6/11, 3/14 and 12/13/16- medical Rx  . Diabetes mellitus   . GERD (gastroesophageal reflux disease)   . Hiatal hernia   . Hyperlipidemia   . Hypertension   . Myocardial infarction ([place]) 2009  . PONV (postoperative nausea and vomiting)   . Schatzki's ring   . Shoulder injury    resolved after shoulder surgery  . Sleep apnea    not on cpap    Past Surgical History:  Procedure Laterality Date  . ABDOMINAL HYSTERECTOMY    . APPENDECTOMY     came out with Hysterectomy  . CARDIAC CATHETERIZATION  07/23/2009   EF 60%  . CARDIAC CATHETERIZATION  10/11/2008  . CARDIAC CATHETERIZATION  03/01/2007   EF 75-80%  . CARDIOVASCULAR STRESS TEST  11/15/2007   EF 60%  . COLONOSCOPY    . CORONARY ARTERY BYPASS GRAFT     SEVERELY DISEASED SAPHENOUS VEIN GRAFT TO THE RIGHT CORONARY ARTERY BUT WITH FAIRLY WELL PRESERVED FLOW TO THE DISTAL RIGHT CORONARY  ARTERY FROM THE NATIVE CIRCULATION-RESTART  CATH IN JUNE 2000, REVEALS MILD/MODERATE  CAD WITH GOOD FLOW DOWN HER LAD  . ESOPHAGOGASTRODUODENOSCOPY    . EYE SURGERY     bilateral cataract surgery with lens implant  . LEFT HEART CATH AND CORS/GRAFTS ANGIOGRAPHY N/A 12/14/2016   Procedure: LEFT HEART CATH AND CORS/GRAFTS ANGIOGRAPHY;  Surgeon: Jettie Booze, MD;  Location: [facility];  Service: Cardiovascular;  Laterality: N/A;  . lense removal Left   . POLYPECTOMY    . ROTATOR CUFF REPAIR     right and left  . TONSILLECTOMY     age 77  . 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 tumor removed kidney    . UPPER GASTROINTESTINAL ENDOSCOPY    . US ECHOCARDIOGRAPHY  03/08/2008   EF 55-60%    There were no vitals filed for this visit.  Subjective Assessment - 12/09/17 1320    Subjective  Is taking her exercises slower now and feels it has been better for her balance.    Pertinent History  CABG x 3, OSA, history  of smoking (quit 30 y/ago), asthma, diabetic polyneuropathy, vertigo, DM, COPD, HTN, CHF, cervicogenic headaches    Patient Stated Goals  "Hopefully, be able to improve balance."    Currently in Pain?  No/denies         [facility] PT Assessment - 12/09/17 1322      High Level Balance   High Level Balance Comments  MCTSIB: 30 seconds for condition 1, 2, 3 and 4.  Conditions 3/4 required close supervision due to increased sway      Standardized Balance Assessment   Standardized Balance Assessment  Berg Balance Test      Berg Balance Test   Sit to Stand  Able to stand without using hands and stabilize independently    Standing Unsupported  Able to stand safely 2 minutes    Sitting with Back Unsupported but Feet Supported on Floor or Stool  Able to sit safely and securely 2 minutes    Stand to Sit  Sits safely with minimal use of  hands    Transfers  Able to transfer safely, minor use of hands    Standing Unsupported with Eyes Closed  Able to stand 10 seconds with supervision    Standing Ubsupported with Feet Together  Able to place feet together independently and stand for 1 minute with supervision    From Standing, Reach Forward with Outstretched Arm  Can reach forward >12 cm safely (5")    From Standing Position, Pick up Object from [place] to pick up shoe safely and easily    From Standing Position, Turn to Look Behind Over each Shoulder  Looks behind from both sides and weight shifts well    Turn 360 Degrees  Able to turn 360 degrees safely in 4 seconds or less    Standing Unsupported, Alternately Place Feet on Step/Stool  Able to stand independently and safely and complete 8 steps in 20 seconds    Standing Unsupported, One Foot in Front  Able to place foot tandem independently and hold 30 seconds    Standing on One Leg  Able to lift leg independently and hold > 10 seconds    Total Score  53    Berg comment:  53/56      Functional Gait  Assessment   Gait assessed   Yes    Gait Level Surface  Walks 20 ft, slow speed, abnormal gait pattern, evidence for imbalance or deviates 10-15 in outside of the 12 in walkway width. Requires more than 7 sec to ambulate 20 ft.    Change in Gait Speed  Able to smoothly change walking speed without loss of balance or gait deviation. Deviate no more than 6 in outside of the 12 in walkway width.    Gait with Horizontal Head Turns  Performs head turns smoothly with slight change in gait velocity (eg, minor disruption to smooth gait path), deviates 6-10 in outside 12 in walkway width, or uses an assistive device.    Gait with Vertical Head Turns  Performs task with slight change in gait velocity (eg, minor disruption to smooth gait path), deviates 6 - 10 in outside 12 in walkway width or uses assistive device    Gait and Pivot Turn  Pivot turns safely in greater than 3 sec and stops  with no loss of balance, or pivot turns safely within 3 sec and stops with mild imbalance, requires small steps to catch balance.    Step Over Obstacle  Is able to step over  one shoe box (4.5 in total height) without changing gait speed. No evidence of imbalance.    Gait with Narrow Base of Support  Ambulates less than 4 steps heel to toe or cannot perform without assistance.    Gait with Eyes Closed  Cannot walk 20 ft without assistance, severe gait deviations or imbalance, deviates greater than 15 in outside 12 in walkway width or will not attempt task.    Ambulating Backwards  Walks 20 ft, uses assistive device, slower speed, mild gait deviations, deviates 6-10 in outside 12 in walkway width.    Steps  Alternating feet, no rail.    Total Score  17    FGA comment:  17/30                           PT Education - 12/09/17 1407    Education Details  progress towards goals; plan to further assess vestibular system next session    Person(s) Educated  Patient    Methods  Explanation    Comprehension  Verbalized understanding       PT Short Term Goals - 12/09/17 1349      PT SHORT TERM GOAL #1   Title  patient to be independent with initial HEP    Time  4    Period  Weeks    Status  Achieved      PT SHORT TERM GOAL #2   Title  patient to improve Berg to >/= 50/56 demonstrating improved balance    Baseline  53/56    Time  4    Period  Weeks    Status  Achieved      PT SHORT TERM GOAL #3   Title  Patient to improve FGA to >/= 17/30 demonstrating improved balance and functional mobility    Baseline  17/30    Time  4    Period  Weeks    Status  Achieved      PT SHORT TERM GOAL #4   Title  patient to maintain EC on compliant surfaces for up to 20 seconds without "fall"    Baseline  30 sec    Time  4    Period  Weeks    Status  Achieved        PT Long Term Goals - 11/30/17 1648      PT LONG TERM GOAL #1   Title  patient to be independent with advanced  HEP for LE strength and balane    Time  8    Period  Weeks    Status  New      PT LONG TERM GOAL #2   Title  Patient to report return to [facility] 3-5x/week for cardiovascular endurance, continued strengthening, and integration into community wellness    Time  8    Period  Weeks    Status  New      PT LONG TERM GOAL #3   Title  patient to improve FGA to 21/24 demonstrating reduced fall risk    Time  8    Period  Weeks    Status  New      PT LONG TERM GOAL #4   Title  vestibular assessment and goals as needed    Time  8    Period  Weeks    Status  New      PT LONG TERM GOAL #5   Title  patient to improve SOT composite score  to within age related norms    Time  8    Period  Weeks    Status  New    Target Date  01/06/18            Plan - 12/09/17 1408    Clinical Impression Statement  Treatment session focused on assessment of progress towards STG.  Pt is making good progress and has met all STG and demonstrates improved static standing balance and improved dynamic balance during gait.  Pt's continues to present with slower than normal gait velocity and increased reliance on visual input for balance.  Will complete full assessment of vestibular system next session and initiate vestibular treatment.      Rehab Potential  Good    PT Frequency  2x / week    PT Duration  8 weeks    PT Treatment/Interventions  ADLs/Self Care Home Management;Canalith Repostioning;Therapeutic exercise;Therapeutic activities;Functional mobility training;Stair training;Gait training;Balance training;Neuromuscular re-education;Patient/family education;Manual techniques;Vestibular;Passive range of motion;Visual/perceptual remediation/compensation    PT Next Visit Plan  assess vestibular system and SOT; initiate vestibular treatment    Consulted and Agree with Plan of Care  Patient       Patient will benefit from skilled therapeutic intervention in order to improve the following deficits and impairments:   Decreased balance, Dizziness, Abnormal gait, Decreased strength  Visit Diagnosis: Unsteadiness on feet  Other abnormalities of gait and mobility  Muscle weakness (generalized)     Problem List Patient Active Problem List   Diagnosis Date Noted  . Arthralgia 08/01/2017  . COPD with asthma ([place]) 05/04/2017  . Angina pectoris ([place]) 01/05/2017  . Insomnia 11/23/2016  . Chronic nonintractable headache 11/23/2016  . Pancreatic insufficiency 10/28/2016  . Carotid artery disease ([place]) 05/19/2016  . Fatigue 05/19/2016  . Anemia 03/27/2016  . Severe persistent asthma 03/18/2016  . Type 1 diabetes mellitus ([place]) 07/21/2015  . B12 deficiency 03/31/2015  . OSA (obstructive sleep apnea) 11/20/2014  . Fecal incontinence 08/28/2014  . 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 Falls 08/28/2014  . Neck pain of over 3 months duration 08/28/2014  . H/O total knee replacement 02/20/2014  . Obesity (BMI 30-39.9) 11/12/2013  . Chronic diastolic CHF (congestive heart failure) ([place]) 11/17/2012  . Meniscus, lateral, anterior horn derangement 11/10/2011  . Medial meniscus, posterior horn derangement 11/10/2011  . Osteoarthritis of right knee 11/10/2011  . Vertigo 10/05/2011  . HTN (hypertension), benign 10/05/2011  . Coronary artery disease 08/27/2010  . MUSCLE CRAMPS 12/29/2009  . History of myocardial infarction 11/19/2009  . Extrinsic asthma 11/19/2009  . GERD 11/19/2009  . Cough 11/19/2009  . Hyperlipidemia 11/18/2009    Rico Junker, PT, DPT 12/09/17    2:18 PM    [facility] [address] [place], [place], [address] Phone: [phone]   Fax:  [phone]  Name: [name] MRN: [id number] Date of Birth: 1940/10/19

## 2017-12-15 ENCOUNTER — Encounter: Payer: Self-pay | Admitting: Physical Therapy

## 2017-12-15 ENCOUNTER — Ambulatory Visit: Payer: Medicare Other | Admitting: Physical Therapy

## 2017-12-15 DIAGNOSIS — R2681 Unsteadiness on feet: Secondary | ICD-10-CM | POA: Diagnosis not present

## 2017-12-15 DIAGNOSIS — R2689 Other abnormalities of gait and mobility: Secondary | ICD-10-CM

## 2017-12-15 DIAGNOSIS — M6281 Muscle weakness (generalized): Secondary | ICD-10-CM

## 2017-12-15 NOTE — Therapy (Signed)
[facility] [address] [place], [place], [address] Phone: [phone]   Fax:  [phone]  Physical Therapy Treatment  Patient Details  Name: [name] MRN: [id number] Date of Birth: 1940/05/01 Referring Provider (PT): Metta Clines, DO   Encounter Date: 12/15/2017  PT End of Session - 12/15/17 1649    Visit Number  6    Number of Visits  17    Date for PT Re-Evaluation  01/06/18    Authorization Type  [employer] Medicare - 10th visit PN    PT Start Time  1537    PT Stop Time  1615    PT Time Calculation (min)  38 min    Activity Tolerance  Patient tolerated treatment well    Behavior During Therapy  [facility] for tasks assessed/performed       Past Medical History:  Diagnosis Date  . Adenomatous colon polyp   . Allergy   . Anxiety   . Asthma   . Chest pain   . Chronic diastolic CHF (congestive heart failure) ([place])   . COPD (chronic obstructive pulmonary disease) ([place])    pt is unsure if has been officially diagnosed  . Coronary artery disease    CABG '09- cathed 12/09, 9/10, 6/11, 3/14 and 12/13/16- medical Rx  . Diabetes mellitus   . GERD (gastroesophageal reflux disease)   . Hiatal hernia   . Hyperlipidemia   . Hypertension   . Myocardial infarction ([place]) 2009  . PONV (postoperative nausea and vomiting)   . Schatzki's ring   . Shoulder injury    resolved after shoulder surgery  . Sleep apnea    not on cpap    Past Surgical History:  Procedure Laterality Date  . ABDOMINAL HYSTERECTOMY    . APPENDECTOMY     came out with Hysterectomy  . CARDIAC CATHETERIZATION  07/23/2009   EF 60%  . CARDIAC CATHETERIZATION  10/11/2008  . CARDIAC CATHETERIZATION  03/01/2007   EF 75-80%  . CARDIOVASCULAR STRESS TEST  11/15/2007   EF 60%  . COLONOSCOPY    . CORONARY ARTERY BYPASS GRAFT     SEVERELY DISEASED SAPHENOUS VEIN GRAFT TO THE RIGHT CORONARY ARTERY BUT WITH FAIRLY WELL PRESERVED FLOW TO THE DISTAL RIGHT CORONARY  ARTERY FROM THE NATIVE CIRCULATION-RESTART  CATH IN JUNE 2000, REVEALS MILD/MODERATE  CAD WITH GOOD FLOW DOWN HER LAD  . ESOPHAGOGASTRODUODENOSCOPY    . EYE SURGERY     bilateral cataract surgery with lens implant  . LEFT HEART CATH AND CORS/GRAFTS ANGIOGRAPHY N/A 12/14/2016   Procedure: LEFT HEART CATH AND CORS/GRAFTS ANGIOGRAPHY;  Surgeon: Jettie Booze, MD;  Location: [facility];  Service: Cardiovascular;  Laterality: N/A;  . lense removal Left   . POLYPECTOMY    . ROTATOR CUFF REPAIR     right and left  . TONSILLECTOMY     age 77  . 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 tumor removed kidney    . UPPER GASTROINTESTINAL ENDOSCOPY    . US ECHOCARDIOGRAPHY  03/08/2008   EF 55-60%    There were no vitals filed for this visit.  Subjective Assessment - 12/15/17 1540    Subjective  Has had a busy week.  Working hard at the exercises but still has the sensation of unsteadiness when standing in the middle of the room  so she feels like she has to stay close to something to prevent a fall.    Pertinent History  CABG x 3, OSA, history of smoking (quit 30 y/ago), asthma, diabetic polyneuropathy, vertigo, DM, COPD, HTN, CHF, cervicogenic headaches    Patient Stated Goals  "Hopefully, be able to improve balance."    Currently in Pain?  No/denies             Vestibular Assessment - 12/15/17 1543      Symptom Behavior   Type of Dizziness  Unsteady with head/body turns    Frequency of Dizziness  daily over the past 4 months    Duration of Dizziness  minutes    Aggravating Factors  Turning body quickly;Turning head quickly    Relieving Factors  Comments   touching something sturdy     Occulomotor Exam   Occulomotor Alignment  Normal    Spontaneous  Absent    Gaze-induced  Left beating nystagmus with L gaze    Smooth Pursuits   Intact    Saccades  Dysmetria   with vertical saccades   Comment  + test of skew.  With one eye covered pt reports seeing "two therapists".  Convergence impaired      Vestibulo-Occular Reflex   VOR Cancellation  Corrective saccades    Comment  HIT: difficult to assess due to being startled easily and blinking; does have slight delay in R eye response                Vestibular Treatment/Exercise - 12/15/17 1600      Vestibular Treatment/Exercise   Vestibular Treatment Provided  Gaze    Habituation Exercises  Standing Horizontal Head Turns    Gaze Exercises  X1 Viewing Horizontal;X1 Viewing Vertical      Standing Horizontal Head Turns   Number of Reps   6    Symptom Description   head turns to allow eyes to fixate on stable target to improve stability before turning body standing in corner and then during ambulation making L and R turns      X1 Viewing Horizontal   Foot Position  standing feet apart, UE supported    Reps  2    Comments  30 seconds      X1 Viewing Vertical   Foot Position  standing feet apart, bilat UE support    Reps  2    Comments  30 seconds            PT Education - 12/15/17 1647    Education Details  added gaze adaptation to HEP and educated pt on use of spotting during turns to improve stability    Person(s) Educated  Patient    Methods  Explanation;Demonstration;Handout    Comprehension  Verbalized understanding;Returned demonstration       PT Short Term Goals - 12/09/17 1349      PT SHORT TERM GOAL #1   Title  patient to be independent with initial HEP    Time  4    Period  Weeks    Status  Achieved      PT SHORT TERM GOAL #2   Title  patient to improve Berg to >/= 50/56 demonstrating improved balance    Baseline  53/56    Time  4    Period  Weeks    Status  Achieved      PT SHORT TERM GOAL #3   Title  Patient to improve FGA to >/= 17/30 demonstrating improved  balance and functional mobility    Baseline  17/30    Time  4     Period  Weeks    Status  Achieved      PT SHORT TERM GOAL #4   Title  patient to maintain EC on compliant surfaces for up to 20 seconds without "fall"    Baseline  30 sec    Time  4    Period  Weeks    Status  Achieved        PT Long Term Goals - 12/15/17 1656      PT LONG TERM GOAL #1   Title  patient to be independent with advanced HEP for LE strength and balance    Time  8    Period  Weeks    Status  New    Target Date  01/06/18      PT LONG TERM GOAL #2   Title  Patient to report return to [facility] 3-5x/week for cardiovascular endurance, continued strengthening, and integration into community wellness    Time  8    Period  Weeks    Status  New    Target Date  01/06/18      PT LONG TERM GOAL #3   Title  patient to improve FGA to 21/24 demonstrating reduced fall risk    Time  8    Period  Weeks    Status  New    Target Date  01/06/18      PT LONG TERM GOAL #4   Title  vestibular assessment and goals as needed    Baseline  performed on 11/7 - no formal LTG needed    Status  Achieved      PT LONG TERM GOAL #5   Title  patient to improve SOT composite score to within age related norms    Baseline  42    Time  8    Period  Weeks    Status  New    Target Date  01/06/18            Plan - 12/15/17 1649    Clinical Impression Statement  Performed vestibular assessment to determine effect on balance; pt demonstrated oculomotor impairments and impaired visual-vestibular interactions.   Initiated standing gaze adaptation exercise and use of spotting during head and body turns to improve stability.  Will continue to address multi-sensory deficits to progress towards LTG.    Rehab Potential  Good    PT Frequency  2x / week    PT Duration  8 weeks    PT Treatment/Interventions  ADLs/Self Care Home Management;Canalith Repostioning;Therapeutic exercise;Therapeutic activities;Functional mobility training;Stair training;Gait training;Balance training;Neuromuscular  re-education;Patient/family education;Manual techniques;Vestibular;Passive range of motion;Visual/perceptual remediation/compensation    PT Next Visit Plan  continue to review gaze adapatation, balance with head and body turns, add to balance HEP    PT Home Exercise Plan  Access Code: [email or web address]    Consulted and Agree with Plan of Care  Patient       Patient will benefit from skilled therapeutic intervention in order to improve the following deficits and impairments:  Decreased balance, Dizziness, Abnormal gait, Decreased strength  Visit Diagnosis: Unsteadiness on feet  Other abnormalities of gait and mobility  Muscle weakness (generalized)     Problem List Patient Active Problem List   Diagnosis Date Noted  . Arthralgia 08/01/2017  . COPD with asthma ([place]) 05/04/2017  . Angina pectoris ([place]) 01/05/2017  . Insomnia 11/23/2016  . Chronic nonintractable headache 11/23/2016  .  Pancreatic insufficiency 10/28/2016  . Carotid artery disease ([place]) 05/19/2016  . Fatigue 05/19/2016  . Anemia 03/27/2016  . Severe persistent asthma 03/18/2016  . Type 1 diabetes mellitus ([place]) 07/21/2015  . B12 deficiency 03/31/2015  . OSA (obstructive sleep apnea) 11/20/2014  . Fecal incontinence 08/28/2014  . 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 Falls 08/28/2014  . Neck pain of over 3 months duration 08/28/2014  . H/O total knee replacement 02/20/2014  . Obesity (BMI 30-39.9) 11/12/2013  . Chronic diastolic CHF (congestive heart failure) ([place]) 11/17/2012  . Meniscus, lateral, anterior horn derangement 11/10/2011  . Medial meniscus, posterior horn derangement 11/10/2011  . Osteoarthritis of right knee 11/10/2011  . Vertigo 10/05/2011  . HTN (hypertension), benign 10/05/2011  . Coronary artery disease 08/27/2010  . MUSCLE CRAMPS 12/29/2009  . History of myocardial infarction 11/19/2009  . Extrinsic asthma 11/19/2009  . GERD 11/19/2009  . Cough 11/19/2009  . Hyperlipidemia 11/18/2009     Rico Junker, PT, DPT 12/15/17    4:59 PM    [facility] [address] [place], [place], [address] Phone: [phone]   Fax:  [phone]  Name: DELANEE XIN MRN: [id number] Date of Birth: 04/16/1940

## 2017-12-15 NOTE — Patient Instructions (Signed)
Access Code: [email or web address]  URL: [email or web address]  Date: 12/15/2017  Prepared by: Misty Stanley   Exercises  Standing Gaze Stabilization with Head Rotation - 2 sets - 30 seconds hold - 2x daily - 7x weekly  Standing Gaze Stabilization with Head Nod - 2 sets - 30 second hold - 2x daily - 7x weekly

## 2017-12-16 ENCOUNTER — Encounter: Payer: Self-pay | Admitting: Physical Therapy

## 2017-12-16 ENCOUNTER — Ambulatory Visit: Payer: Medicare Other | Admitting: Physical Therapy

## 2017-12-16 DIAGNOSIS — R2681 Unsteadiness on feet: Secondary | ICD-10-CM

## 2017-12-16 DIAGNOSIS — M6281 Muscle weakness (generalized): Secondary | ICD-10-CM

## 2017-12-16 DIAGNOSIS — R2689 Other abnormalities of gait and mobility: Secondary | ICD-10-CM

## 2017-12-16 NOTE — Therapy (Signed)
[facility] [address] [place], [place], [address] Phone: [phone]   Fax:  [phone]  Physical Therapy Treatment  Patient Details  Name: [name] MRN: [id number] Date of Birth: 08/26/1940 Referring Provider (PT): Metta Clines, DO   Encounter Date: 12/16/2017  PT End of Session - 12/16/17 1535    Visit Number  7    Number of Visits  17    Date for PT Re-Evaluation  01/06/18    Authorization Type  [employer] Medicare - 10th visit PN    PT Start Time  1531    PT Stop Time  1612    PT Time Calculation (min)  41 min    Activity Tolerance  Patient tolerated treatment well    Behavior During Therapy  [facility] for tasks assessed/performed       Past Medical History:  Diagnosis Date  . Adenomatous colon polyp   . Allergy   . Anxiety   . Asthma   . Chest pain   . Chronic diastolic CHF (congestive heart failure) ([place])   . COPD (chronic obstructive pulmonary disease) ([place])    pt is unsure if has been officially diagnosed  . Coronary artery disease    CABG '09- cathed 12/09, 9/10, 6/11, 3/14 and 12/13/16- medical Rx  . Diabetes mellitus   . GERD (gastroesophageal reflux disease)   . Hiatal hernia   . Hyperlipidemia   . Hypertension   . Myocardial infarction ([place]) 2009  . PONV (postoperative nausea and vomiting)   . Schatzki's ring   . Shoulder injury    resolved after shoulder surgery  . Sleep apnea    not on cpap    Past Surgical History:  Procedure Laterality Date  . ABDOMINAL HYSTERECTOMY    . APPENDECTOMY     came out with Hysterectomy  . CARDIAC CATHETERIZATION  07/23/2009   EF 60%  . CARDIAC CATHETERIZATION  10/11/2008  . CARDIAC CATHETERIZATION  03/01/2007   EF 75-80%  . CARDIOVASCULAR STRESS TEST  11/15/2007   EF 60%  . COLONOSCOPY    . CORONARY ARTERY BYPASS GRAFT     SEVERELY DISEASED SAPHENOUS VEIN GRAFT TO THE RIGHT CORONARY ARTERY BUT WITH FAIRLY WELL PRESERVED FLOW TO THE DISTAL RIGHT CORONARY  ARTERY FROM THE NATIVE CIRCULATION-RESTART  CATH IN JUNE 2000, REVEALS MILD/MODERATE  CAD WITH GOOD FLOW DOWN HER LAD  . ESOPHAGOGASTRODUODENOSCOPY    . EYE SURGERY     bilateral cataract surgery with lens implant  . LEFT HEART CATH AND CORS/GRAFTS ANGIOGRAPHY N/A 12/14/2016   Procedure: LEFT HEART CATH AND CORS/GRAFTS ANGIOGRAPHY;  Surgeon: Jettie Booze, MD;  Location: [facility];  Service: Cardiovascular;  Laterality: N/A;  . lense removal Left   . POLYPECTOMY    . ROTATOR CUFF REPAIR     right and left  . TONSILLECTOMY     age 37  . 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 tumor removed kidney    . UPPER GASTROINTESTINAL ENDOSCOPY    . US ECHOCARDIOGRAPHY  03/08/2008   EF 55-60%    There were no vitals filed for this visit.  Subjective Assessment - 12/16/17 1534    Subjective  doing well - still gets dizzy with turning her head; has been doing exercises without issue    Pertinent History  CABG x 3, OSA,  history of smoking (quit 30 y/ago), asthma, diabetic polyneuropathy, vertigo, DM, COPD, HTN, CHF, cervicogenic headaches    Patient Stated Goals  "Hopefully, be able to improve balance."    Currently in Pain?  No/denies    Multiple Pain Sites  No                        Vestibular Treatment/Exercise - 12/16/17 0001      Vestibular Treatment/Exercise   Gaze Exercises  X1 Viewing Horizontal;X1 Viewing Vertical      X1 Viewing Horizontal   Foot Position  standing feet apart    Reps  2    Comments  30 seconds      X1 Viewing Vertical   Foot Position  standing feet apart    Reps  2    Comments  30 seconds         Balance Exercises - 12/16/17 1640      Balance Exercises: Standing   Rockerboard  Anterior/posterior;Head turns   focusing on maintaining static stance   Other Standing Exercises  quick  head turn to letter x 8 each direction; focusing on letter with full body turn and return to letter. standing on ramp (up/down) with horizontal and vertical head turns           PT Short Term Goals - 12/09/17 1349      PT SHORT TERM GOAL #1   Title  patient to be independent with initial HEP    Time  4    Period  Weeks    Status  Achieved      PT SHORT TERM GOAL #2   Title  patient to improve Berg to >/= 50/56 demonstrating improved balance    Baseline  53/56    Time  4    Period  Weeks    Status  Achieved      PT SHORT TERM GOAL #3   Title  Patient to improve FGA to >/= 17/30 demonstrating improved balance and functional mobility    Baseline  17/30    Time  4    Period  Weeks    Status  Achieved      PT SHORT TERM GOAL #4   Title  patient to maintain EC on compliant surfaces for up to 20 seconds without "fall"    Baseline  30 sec    Time  4    Period  Weeks    Status  Achieved        PT Long Term Goals - 12/15/17 1656      PT LONG TERM GOAL #1   Title  patient to be independent with advanced HEP for LE strength and balance    Time  8    Period  Weeks    Status  New    Target Date  01/06/18      PT LONG TERM GOAL #2   Title  Patient to report return to [facility] 3-5x/week for cardiovascular endurance, continued strengthening, and integration into community wellness    Time  8    Period  Weeks    Status  New    Target Date  01/06/18      PT LONG TERM GOAL #3   Title  patient to improve FGA to 21/24 demonstrating reduced fall risk    Time  8    Period  Weeks    Status  New    Target Date  01/06/18  PT LONG TERM GOAL #4   Title  vestibular assessment and goals as needed    Baseline  performed on 11/7 - no formal LTG needed    Status  Achieved      PT LONG TERM GOAL #5   Title  patient to improve SOT composite score to within age related norms    Baseline  42    Time  8    Period  Weeks    Status  New    Target Date  01/06/18             Plan - 12/16/17 1700    Clinical Impression Statement  Session today focusing largely on head turns for visual-vestibular input training. Patient does well wioth each activity but doe srequire intermittent rest breaks due to onset of dizziness. Patient requiring further education on VOR x1 as patient was going to end range and then looking away from letter card. With education patient able to return demonstration. Making good progress.     Rehab Potential  Good    PT Frequency  2x / week    PT Duration  8 weeks    PT Treatment/Interventions  ADLs/Self Care Home Management;Canalith Repostioning;Therapeutic exercise;Therapeutic activities;Functional mobility training;Stair training;Gait training;Balance training;Neuromuscular re-education;Patient/family education;Manual techniques;Vestibular;Passive range of motion;Visual/perceptual remediation/compensation    PT Next Visit Plan  continue to review gaze adapatation, balance with head and body turns, add to balance HEP    PT Home Exercise Plan  Access Code: [email or web address]    Consulted and Agree with Plan of Care  Patient       Patient will benefit from skilled therapeutic intervention in order to improve the following deficits and impairments:  Decreased balance, Dizziness, Abnormal gait, Decreased strength  Visit Diagnosis: Unsteadiness on feet  Other abnormalities of gait and mobility  Muscle weakness (generalized)     Problem List Patient Active Problem List   Diagnosis Date Noted  . Arthralgia 08/01/2017  . COPD with asthma ([place]) 05/04/2017  . Angina pectoris ([place]) 01/05/2017  . Insomnia 11/23/2016  . Chronic nonintractable headache 11/23/2016  . Pancreatic insufficiency 10/28/2016  . Carotid artery disease ([place]) 05/19/2016  . Fatigue 05/19/2016  . Anemia 03/27/2016  . Severe persistent asthma 03/18/2016  . Type 1 diabetes mellitus ([place]) 07/21/2015  . B12 deficiency 03/31/2015  . OSA (obstructive sleep apnea)  11/20/2014  . Fecal incontinence 08/28/2014  . 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 Falls 08/28/2014  . Neck pain of over 3 months duration 08/28/2014  . H/O total knee replacement 02/20/2014  . Obesity (BMI 30-39.9) 11/12/2013  . Chronic diastolic CHF (congestive heart failure) ([place]) 11/17/2012  . Meniscus, lateral, anterior horn derangement 11/10/2011  . Medial meniscus, posterior horn derangement 11/10/2011  . Osteoarthritis of right knee 11/10/2011  . Vertigo 10/05/2011  . HTN (hypertension), benign 10/05/2011  . Coronary artery disease 08/27/2010  . MUSCLE CRAMPS 12/29/2009  . History of myocardial infarction 11/19/2009  . Extrinsic asthma 11/19/2009  . GERD 11/19/2009  . Cough 11/19/2009  . Hyperlipidemia 11/18/2009     Lanney Gins, PT, DPT Supplemental Physical Therapist 12/16/17 5:03 PM Pager: [phone] Office: [facility] [address] [place], [place], [address] Phone: [phone]   Fax:  [phone]  Name: JUSTA HATCHELL MRN: [id number] Date of Birth: Aug 31, 1940

## 2017-12-19 ENCOUNTER — Ambulatory Visit: Payer: Medicare Other | Admitting: Physical Therapy

## 2017-12-19 DIAGNOSIS — R2681 Unsteadiness on feet: Secondary | ICD-10-CM | POA: Diagnosis not present

## 2017-12-19 DIAGNOSIS — R2689 Other abnormalities of gait and mobility: Secondary | ICD-10-CM

## 2017-12-19 DIAGNOSIS — M6281 Muscle weakness (generalized): Secondary | ICD-10-CM

## 2017-12-19 NOTE — Patient Instructions (Addendum)
WALKING  Walking is a great form of exercise to increase your strength, endurance and overall fitness.  A walking program can help you start slowly and gradually build endurance as you go.  Everyone's ability is different, so each person's starting point will be different.  You do not have to follow them exactly.  The are just samples. You should simply find out what's right for you and stick to that program.     B. You Can Walk For a Certain Distance Each Day     - Walk at [facility] for 2 laps x 5 days (3 days one week, 2 days second week)   -Increase to 3 laps for final day of second week.  (3 laps for 3 days + 2 days in following week)   - Increase to 4 laps for final day (4 laps for 3 days + 2 days in following week)  Continue until you work up to 10 laps as long as you can tolerate it with very minimal dizziness.

## 2017-12-19 NOTE — Therapy (Signed)
[facility] [address] [place], [place], [address] Phone: [phone]   Fax:  [phone]  Physical Therapy Treatment  Patient Details  Name: [name] MRN: [id number] Date of Birth: April 25, 1940 Referring Provider (PT): Metta Clines, DO   Encounter Date: 12/19/2017  PT End of Session - 12/19/17 1530    Visit Number  8    Number of Visits  17    Date for PT Re-Evaluation  01/06/18    Authorization Type  [employer] Medicare - 10th visit PN    PT Start Time  0175    PT Stop Time  1530    PT Time Calculation (min)  45 min    Activity Tolerance  Patient tolerated treatment well    Behavior During Therapy  [facility] for tasks assessed/performed       Past Medical History:  Diagnosis Date  . Adenomatous colon polyp   . Allergy   . Anxiety   . Asthma   . Chest pain   . Chronic diastolic CHF (congestive heart failure) ([place])   . COPD (chronic obstructive pulmonary disease) ([place])    pt is unsure if has been officially diagnosed  . Coronary artery disease    CABG '09- cathed 12/09, 9/10, 6/11, 3/14 and 12/13/16- medical Rx  . Diabetes mellitus   . GERD (gastroesophageal reflux disease)   . Hiatal hernia   . Hyperlipidemia   . Hypertension   . Myocardial infarction ([place]) 2009  . PONV (postoperative nausea and vomiting)   . Schatzki's ring   . Shoulder injury    resolved after shoulder surgery  . Sleep apnea    not on cpap    Past Surgical History:  Procedure Laterality Date  . ABDOMINAL HYSTERECTOMY    . APPENDECTOMY     came out with Hysterectomy  . CARDIAC CATHETERIZATION  07/23/2009   EF 60%  . CARDIAC CATHETERIZATION  10/11/2008  . CARDIAC CATHETERIZATION  03/01/2007   EF 75-80%  . CARDIOVASCULAR STRESS TEST  11/15/2007   EF 60%  . COLONOSCOPY    . CORONARY ARTERY BYPASS GRAFT     SEVERELY DISEASED SAPHENOUS VEIN GRAFT TO THE RIGHT CORONARY ARTERY BUT WITH FAIRLY WELL PRESERVED FLOW TO THE DISTAL RIGHT CORONARY  ARTERY FROM THE NATIVE CIRCULATION-RESTART  CATH IN JUNE 2000, REVEALS MILD/MODERATE  CAD WITH GOOD FLOW DOWN HER LAD  . ESOPHAGOGASTRODUODENOSCOPY    . EYE SURGERY     bilateral cataract surgery with lens implant  . LEFT HEART CATH AND CORS/GRAFTS ANGIOGRAPHY N/A 12/14/2016   Procedure: LEFT HEART CATH AND CORS/GRAFTS ANGIOGRAPHY;  Surgeon: Jettie Booze, MD;  Location: [facility];  Service: Cardiovascular;  Laterality: N/A;  . lense removal Left   . POLYPECTOMY    . ROTATOR CUFF REPAIR     right and left  . TONSILLECTOMY     age 49  . 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 tumor removed kidney    . UPPER GASTROINTESTINAL ENDOSCOPY    . US ECHOCARDIOGRAPHY  03/08/2008   EF 55-60%    There were no vitals filed for this visit.  Subjective Assessment - 12/19/17 1452    Subjective  Had a quiet weekend but had increased dizziness on Saturday.   Is working up to 10 laps at [employer].  Tolerated 2 laps and tried  3 the next day with dizziness afterwards.  Feels she may have overdone it with 3 laps.    Pertinent History  CABG x 3, OSA, history of smoking (quit 30 y/ago), asthma, diabetic polyneuropathy, vertigo, DM, COPD, HTN, CHF, cervicogenic headaches    Patient Stated Goals  "Hopefully, be able to improve balance."    Currently in Pain?  No/denies                       [facility] Adult PT Treatment/Exercise - 12/19/17 1459      Therapeutic Activites    Therapeutic Activities  Other Therapeutic Activities    Other Therapeutic Activities  discussed how to progress walking program gradually progress walking program to avoid over stimulation      WALKING  Walking is a great form of exercise to increase your strength, endurance and overall fitness.  A walking program can help you start slowly and gradually build endurance as you  go.  Everyone's ability is different, so each person's starting point will be different.  You do not have to follow them exactly.  The are just samples. You should simply find out what's right for you and stick to that program.     B. You Can Walk For a Certain Distance Each Day     - Walk at [facility] for 2 laps x 5 days (3 days one week, 2 days second week)   -Increase to 3 laps for final day of second week.  (3 laps for 3 days + 2 days in following week)   - Increase to 4 laps for final day (4 laps for 3 days + 2 days in following week)  Continue until you work up to 10 laps as long as you can tolerate it with very minimal dizziness.  Vestibular Treatment/Exercise - 12/19/17 1507      Vestibular Treatment/Exercise   Vestibular Treatment Provided  Gaze    Habituation Exercises  Standing Horizontal Head Turns;Standing Vertical Head Turns    Gaze Exercises  X1 Viewing Horizontal;X1 Viewing Vertical      Standing Horizontal Head Turns   Symptom Description   full lap around gym x 2 while bouncing ball on floor and then tossing ball up in air; more symptomatic with ball tosses in air      Standing Vertical Head Turns   Symptom Description   full lap around gym tossing ball to therapist on R and L with greater insecurity when looking to the R      X1 Viewing Horizontal   Foot Position  feet apart    Reps  2    Comments  remained at 30 seconds, UE support on back of chair      X1 Viewing Vertical   Foot Position  feet apart    Reps  2    Comments  remained at 30 seconds, UE support on back of chair            PT Education - 12/19/17 1529    Education Details  adjusted how quickly pt progressed walking program    Person(s) Educated  Patient    Methods  Explanation    Comprehension  Verbalized understanding       PT Short Term Goals - 12/09/17 1349      PT SHORT TERM GOAL #1   Title  patient to be independent with initial HEP    Time  4    Period  Weeks  Status   Achieved      PT SHORT TERM GOAL #2   Title  patient to improve Berg to >/= 50/56 demonstrating improved balance    Baseline  53/56    Time  4    Period  Weeks    Status  Achieved      PT SHORT TERM GOAL #3   Title  Patient to improve FGA to >/= 17/30 demonstrating improved balance and functional mobility    Baseline  17/30    Time  4    Period  Weeks    Status  Achieved      PT SHORT TERM GOAL #4   Title  patient to maintain EC on compliant surfaces for up to 20 seconds without "fall"    Baseline  30 sec    Time  4    Period  Weeks    Status  Achieved        PT Long Term Goals - 12/15/17 1656      PT LONG TERM GOAL #1   Title  patient to be independent with advanced HEP for LE strength and balance    Time  8    Period  Weeks    Status  New    Target Date  01/06/18      PT LONG TERM GOAL #2   Title  Patient to report return to [facility] 3-5x/week for cardiovascular endurance, continued strengthening, and integration into community wellness    Time  8    Period  Weeks    Status  New    Target Date  01/06/18      PT LONG TERM GOAL #3   Title  patient to improve FGA to 21/24 demonstrating reduced fall risk    Time  8    Period  Weeks    Status  New    Target Date  01/06/18      PT LONG TERM GOAL #4   Title  vestibular assessment and goals as needed    Baseline  performed on 11/7 - no formal LTG needed    Status  Achieved      PT LONG TERM GOAL #5   Title  patient to improve SOT composite score to within age related norms    Baseline  42    Time  8    Period  Weeks    Status  New    Target Date  01/06/18            Plan - 12/19/17 1628    Clinical Impression Statement  Continued to discuss safe and effective ways to progress her walking program and HEP; pt unable to tolerate progression of x1 viewing today - advised pt continue as first prescribed for a few more days before upgrading x 1 viewing.  Added visual scanning and head turns to ambulation while  performing ball tosses and bounces vertically and horizontally.  Pt reported greater symptoms with upward head/eye movements and head/eye movements to R.  Will continue to progress towards LTG.    Rehab Potential  Good    PT Frequency  2x / week    PT Duration  8 weeks    PT Treatment/Interventions  ADLs/Self Care Home Management;Canalith Repostioning;Therapeutic exercise;Therapeutic activities;Functional mobility training;Stair training;Gait training;Balance training;Neuromuscular re-education;Patient/family education;Manual techniques;Vestibular;Passive range of motion;Visual/perceptual remediation/compensation    PT Next Visit Plan  if able, progress x 1 viewing.  balance/gait with head/body turns.  balance training with compliant surfaces and decreased reliance  on visual information    PT Home Exercise Plan  Access Code: [email or web address]    Consulted and Agree with Plan of Care  Patient       Patient will benefit from skilled therapeutic intervention in order to improve the following deficits and impairments:  Decreased balance, Dizziness, Abnormal gait, Decreased strength  Visit Diagnosis: Unsteadiness on feet  Other abnormalities of gait and mobility  Muscle weakness (generalized)     Problem List Patient Active Problem List   Diagnosis Date Noted  . Arthralgia 08/01/2017  . COPD with asthma ([place]) 05/04/2017  . Angina pectoris ([place]) 01/05/2017  . Insomnia 11/23/2016  . Chronic nonintractable headache 11/23/2016  . Pancreatic insufficiency 10/28/2016  . Carotid artery disease ([place]) 05/19/2016  . Fatigue 05/19/2016  . Anemia 03/27/2016  . Severe persistent asthma 03/18/2016  . Type 1 diabetes mellitus ([place]) 07/21/2015  . B12 deficiency 03/31/2015  . OSA (obstructive sleep apnea) 11/20/2014  . Fecal incontinence 08/28/2014  . 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 Falls 08/28/2014  . Neck pain of over 3 months duration 08/28/2014  . H/O total knee replacement 02/20/2014  .  Obesity (BMI 30-39.9) 11/12/2013  . Chronic diastolic CHF (congestive heart failure) ([place]) 11/17/2012  . Meniscus, lateral, anterior horn derangement 11/10/2011  . Medial meniscus, posterior horn derangement 11/10/2011  . Osteoarthritis of right knee 11/10/2011  . Vertigo 10/05/2011  . HTN (hypertension), benign 10/05/2011  . Coronary artery disease 08/27/2010  . MUSCLE CRAMPS 12/29/2009  . History of myocardial infarction 11/19/2009  . Extrinsic asthma 11/19/2009  . GERD 11/19/2009  . Cough 11/19/2009  . Hyperlipidemia 11/18/2009    Rico Junker, PT, DPT 12/19/17    4:31 PM    [facility] [address] [place], [place], [address] Phone: [phone]   Fax:  [phone]  Name: GRISELDA [name] MRN: [id number] Date of Birth: [birth year]

## 2017-12-20 ENCOUNTER — Other Ambulatory Visit: Payer: Medicare Other

## 2017-12-20 ENCOUNTER — Other Ambulatory Visit (INDEPENDENT_AMBULATORY_CARE_PROVIDER_SITE_OTHER): Payer: Medicare Other

## 2017-12-20 ENCOUNTER — Ambulatory Visit (INDEPENDENT_AMBULATORY_CARE_PROVIDER_SITE_OTHER)
Admission: RE | Admit: 2017-12-20 | Discharge: 2017-12-20 | Disposition: A | Discharge status: Home / Self Care | Payer: Medicare Other | Source: Ambulatory Visit | Attending: Pulmonary Disease | Admitting: Pulmonary Disease

## 2017-12-20 DIAGNOSIS — E1059 Type 1 diabetes mellitus with other circulatory complications: Secondary | ICD-10-CM

## 2017-12-20 DIAGNOSIS — R918 Other nonspecific abnormal finding of lung field: Secondary | ICD-10-CM

## 2017-12-20 DIAGNOSIS — D649 Anemia, unspecified: Secondary | ICD-10-CM

## 2017-12-20 DIAGNOSIS — E785 Hyperlipidemia, unspecified: Secondary | ICD-10-CM

## 2017-12-20 DIAGNOSIS — I10 Essential (primary) hypertension: Secondary | ICD-10-CM

## 2017-12-20 DIAGNOSIS — M2550 Pain in unspecified joint: Secondary | ICD-10-CM | POA: Diagnosis not present

## 2017-12-20 LAB — CBC WITH DIFFERENTIAL/PLATELET
BASOS PCT: 0.7 % (ref 0.0–3.0)
Basophils Absolute: 0 10*3/uL (ref 0.0–0.1)
EOS PCT: 2.6 % (ref 0.0–5.0)
Eosinophils Absolute: 0.2 10*3/uL (ref 0.0–0.7)
HEMATOCRIT: 35.1 % — AB (ref 36.0–46.0)
Hemoglobin: 11.2 g/dL — ABNORMAL LOW (ref 12.0–15.0)
LYMPHS ABS: 2.1 10*3/uL (ref 0.7–4.0)
Lymphocytes Relative: 35.2 % (ref 12.0–46.0)
MCHC: 31.8 g/dL (ref 30.0–36.0)
MCV: 77.3 fl — AB (ref 78.0–100.0)
MONOS PCT: 9 % (ref 3.0–12.0)
Monocytes Absolute: 0.5 10*3/uL (ref 0.1–1.0)
NEUTROS ABS: 3.2 10*3/uL (ref 1.4–7.7)
NEUTROS PCT: 52.5 % (ref 43.0–77.0)
PLATELETS: 285 10*3/uL (ref 150.0–400.0)
RBC: 4.54 Mil/uL (ref 3.87–5.11)
RDW: 17.6 % — ABNORMAL HIGH (ref 11.5–15.5)
WBC: 6 10*3/uL (ref 4.0–10.5)

## 2017-12-20 LAB — LIPID PANEL
Cholesterol: 165 mg/dL (ref 0–200)
HDL: 37.4 mg/dL — AB (ref >39.00)
LDL Cholesterol: 101 mg/dL — ABNORMAL HIGH (ref 0–99)
NONHDL: 128.02
Total CHOL/HDL Ratio: 4
Triglycerides: 136 mg/dL (ref 0.0–149.0)
VLDL: 27.2 mg/dL (ref 0.0–40.0)

## 2017-12-20 LAB — COMPREHENSIVE METABOLIC PANEL
ALT: 11 U/L (ref 0–35)
AST: 15 U/L (ref 0–37)
Albumin: 4.1 g/dL (ref 3.5–5.2)
Alkaline Phosphatase: 134 U/L — ABNORMAL HIGH (ref 39–117)
BUN: 22 mg/dL (ref 6–23)
CHLORIDE: 104 meq/L (ref 96–112)
CO2: 28 mEq/L (ref 19–32)
Calcium: 9 mg/dL (ref 8.4–10.5)
Creatinine, Ser: 0.83 mg/dL (ref 0.40–1.20)
GFR: 85.24 mL/min (ref >60.00)
GLUCOSE: 151 mg/dL — AB (ref 70–99)
POTASSIUM: 4 meq/L (ref 3.5–5.1)
Sodium: 143 mEq/L (ref 135–145)
Total Bilirubin: 0.3 mg/dL (ref 0.2–1.2)
Total Protein: 7.2 g/dL (ref 6.0–8.3)

## 2017-12-20 LAB — SEDIMENTATION RATE: SED RATE: 46 mm/h — AB (ref 0–30)

## 2017-12-20 LAB — C-REACTIVE PROTEIN: CRP: 0.8 mg/dL (ref 0.5–20.0)

## 2017-12-20 LAB — HEMOGLOBIN A1C: Hgb A1c MFr Bld: 8.2 % — ABNORMAL HIGH (ref 4.6–6.5)

## 2017-12-21 ENCOUNTER — Ambulatory Visit: Payer: Medicare Other | Admitting: Physical Therapy

## 2017-12-21 LAB — IRON,TIBC AND FERRITIN PANEL
%SAT: 10 % — AB (ref 16–45)
Ferritin: 17 ng/mL (ref 16–288)
Iron: 41 ug/dL — ABNORMAL LOW (ref 45–160)
TIBC: 412 mcg/dL (calc) (ref 250–450)

## 2017-12-21 LAB — CYCLIC CITRUL PEPTIDE ANTIBODY, IGG: Cyclic Citrullin Peptide Ab: <16 UNITS

## 2017-12-21 LAB — RHEUMATOID FACTOR

## 2017-12-21 LAB — ANA: Anti Nuclear Antibody(ANA): NEGATIVE

## 2017-12-22 ENCOUNTER — Encounter: Payer: Self-pay | Admitting: Internal Medicine

## 2017-12-23 ENCOUNTER — Telehealth: Payer: Self-pay | Admitting: Pulmonary Disease

## 2017-12-23 NOTE — Telephone Encounter (Signed)
Notes recorded by Amado Coe, RN on 12/23/2017 at 9:51 AM EST ATC patient, unable to reach [facility] x1 on preferred phone number listed for patient.  ------  Notes recorded by Juanito Doom, MD on 12/22/2017 at 1:56 PM EST BJ, Please let the patient know that her nodule looked better Thanks, B       Called and spoke with pt letting her know the results of the CT. Pt expressed understanding. Nothing further needed.

## 2017-12-25 ENCOUNTER — Other Ambulatory Visit: Payer: Self-pay | Admitting: Physician Assistant

## 2017-12-26 ENCOUNTER — Encounter: Payer: Self-pay | Admitting: Physical Therapy

## 2017-12-26 ENCOUNTER — Ambulatory Visit: Payer: Medicare Other | Admitting: Physical Therapy

## 2017-12-26 DIAGNOSIS — R2681 Unsteadiness on feet: Secondary | ICD-10-CM

## 2017-12-26 DIAGNOSIS — M6281 Muscle weakness (generalized): Secondary | ICD-10-CM

## 2017-12-26 NOTE — Patient Instructions (Signed)
Access Code: [email or web address]  URL: [email or web address]  Date: 12/26/2017  Prepared by: Barry Brunner   Exercises (Updated due to pt feeling 'dizzy" for >30 minutes after exercises Standing Gaze Stabilization with Head Rotation - 2 sets - 30 seconds hold - 2-3x daily - 7x weekly  Standing Gaze Stabilization with Head Nod - 2 sets - 30 second hold - 2-3x daily - 7x weekly

## 2017-12-26 NOTE — Therapy (Signed)
[facility] [address] [place], [place], [address] Phone: [phone]   Fax:  [phone]  Physical Therapy Treatment  Patient Details  Name: [name] MRN: [id number] Date of Birth: 1940/09/06 Referring Provider (PT): Metta Clines, DO   Encounter Date: 12/26/2017  PT End of Session - 12/26/17 1630    Visit Number  9    Number of Visits  17    Date for PT Re-Evaluation  01/06/18    Authorization Type  [employer] Medicare - 10th visit PN    PT Start Time  1538    PT Stop Time  1617    PT Time Calculation (min)  39 min    Equipment Utilized During Treatment  Gait belt    Activity Tolerance  Patient tolerated treatment well    Behavior During Therapy  WFL for tasks assessed/performed       Past Medical History:  Diagnosis Date  . Adenomatous colon polyp   . Allergy   . Anxiety   . Asthma   . Chest pain   . Chronic diastolic CHF (congestive heart failure) ([place])   . COPD (chronic obstructive pulmonary disease) ([place])    pt is unsure if has been officially diagnosed  . Coronary artery disease    CABG '09- cathed 12/09, 9/10, 6/11, 3/14 and 12/13/16- medical Rx  . Diabetes mellitus   . GERD (gastroesophageal reflux disease)   . Hiatal hernia   . Hyperlipidemia   . Hypertension   . Myocardial infarction ([place]) 2009  . PONV (postoperative nausea and vomiting)   . Schatzki's ring   . Shoulder injury    resolved after shoulder surgery  . Sleep apnea    not on cpap    Past Surgical History:  Procedure Laterality Date  . ABDOMINAL HYSTERECTOMY    . APPENDECTOMY     came out with Hysterectomy  . CARDIAC CATHETERIZATION  07/23/2009   EF 60%  . CARDIAC CATHETERIZATION  10/11/2008  . CARDIAC CATHETERIZATION  03/01/2007   EF 75-80%  . CARDIOVASCULAR STRESS TEST  11/15/2007   EF 60%  . COLONOSCOPY    . CORONARY ARTERY BYPASS GRAFT     SEVERELY DISEASED SAPHENOUS VEIN GRAFT TO THE RIGHT CORONARY ARTERY BUT WITH FAIRLY  WELL PRESERVED FLOW TO THE DISTAL RIGHT CORONARY ARTERY FROM THE NATIVE CIRCULATION-RESTART  CATH IN JUNE 2000, REVEALS MILD/MODERATE  CAD WITH GOOD FLOW DOWN HER LAD  . ESOPHAGOGASTRODUODENOSCOPY    . EYE SURGERY     bilateral cataract surgery with lens implant  . LEFT HEART CATH AND CORS/GRAFTS ANGIOGRAPHY N/A 12/14/2016   Procedure: LEFT HEART CATH AND CORS/GRAFTS ANGIOGRAPHY;  Surgeon: Jettie Booze, MD;  Location: [facility];  Service: Cardiovascular;  Laterality: N/A;  . lense removal Left   . POLYPECTOMY    . ROTATOR CUFF REPAIR     right and left  . TONSILLECTOMY     age 77  . 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 tumor removed kidney    . UPPER GASTROINTESTINAL ENDOSCOPY    . US ECHOCARDIOGRAPHY  03/08/2008   EF 55-60%    There were no vitals filed for this visit.  Subjective Assessment - 12/26/17 1541    Subjective  Went around track twice saturday at the [facility] and experienced some dizziness and was unable to  do a third lap.    Pertinent History  CABG x 3, OSA, history of smoking (quit 30 y/ago), asthma, diabetic polyneuropathy, vertigo, DM, COPD, HTN, CHF, cervicogenic headaches    Patient Stated Goals  "Hopefully, be able to improve balance."    Currently in Pain?  No/denies                       [facility] Adult PT Treatment/Exercise - 12/26/17 1627      High Level Balance   High Level Balance Comments  In parallel bars: 1 rep feet apart EC for 30 sec; 2 reps feet together EC for 30 seconds; 2 reps EC with vertical head turns; stepping over obstacles with horizonal head turns x4 laps. Supervision to min guard needed to maintain balance. Pt reports having 6/10 dizziness after all exercises.       Vestibular Treatment/Exercise - 12/26/17 1622      X1 Viewing Horizontal   Foot Position  feet apart      Reps  3    Comments  30 seconds, UE support on back of chair. Pt experiences an increase in symptoms.       X1 Viewing Vertical   Foot Position  feet together    Reps  2    Comments  30 seconds, UE support on chair for 1 rep, no UE support 1 rep            PT Education - 12/26/17 1636    Education Details  Cut back to 2 reps 2-3x/day with x1 viewing exericses to minimize carryover of symptoms and how the vestibular system, vision, and sense of touch all plays a role in maintaining balance    Person(s) Educated  Patient    Methods  Explanation;Handout    Comprehension  Verbalized understanding       PT Short Term Goals - 12/09/17 1349      PT SHORT TERM GOAL #1   Title  patient to be independent with initial HEP    Time  4    Period  Weeks    Status  Achieved      PT SHORT TERM GOAL #2   Title  patient to improve Berg to >/= 50/56 demonstrating improved balance    Baseline  53/56    Time  4    Period  Weeks    Status  Achieved      PT SHORT TERM GOAL #3   Title  Patient to improve FGA to >/= 17/30 demonstrating improved balance and functional mobility    Baseline  17/30    Time  4    Period  Weeks    Status  Achieved      PT SHORT TERM GOAL #4   Title  patient to maintain EC on compliant surfaces for up to 20 seconds without "fall"    Baseline  30 sec    Time  4    Period  Weeks    Status  Achieved        PT Long Term Goals - 12/15/17 1656      PT LONG TERM GOAL #1   Title  patient to be independent with advanced HEP for LE strength and balance    Time  8    Period  Weeks    Status  New    Target Date  01/06/18      PT LONG TERM GOAL #2   Title  Patient to report  return to [facility] 3-5x/week for cardiovascular endurance, continued strengthening, and integration into community wellness    Time  8    Period  Weeks    Status  New    Target Date  01/06/18      PT LONG TERM GOAL #3   Title  patient to improve FGA to 21/24 demonstrating reduced fall risk     Time  8    Period  Weeks    Status  New    Target Date  01/06/18      PT LONG TERM GOAL #4   Title  vestibular assessment and goals as needed    Baseline  performed on 11/7 - no formal LTG needed    Status  Achieved      PT LONG TERM GOAL #5   Title  patient to improve SOT composite score to within age related norms    Baseline  42    Time  8    Period  Weeks    Status  New    Target Date  01/06/18            Plan - 12/26/17 1631    Clinical Impression Statement  Today's session focused on challenging vestibular system by emphasizing head turns and maintaining balance with eyes closed. Pt advised to cut x1 viewing exercises to 2 reps 2-3x/day to reduce carryover of dizziness after exercises (pt reporting increased symptoms for hours after completes exercises). Pt continues to report greater symptoms with horizontal head turns and eye movement to the right. Pt would benefit from further PT to continue progressing toward LTGs.     Rehab Potential  Good    PT Frequency  2x / week    PT Duration  8 weeks    PT Treatment/Interventions  ADLs/Self Care Home Management;Canalith Repostioning;Therapeutic exercise;Therapeutic activities;Functional mobility training;Stair training;Gait training;Balance training;Neuromuscular re-education;Patient/family education;Manual techniques;Vestibular;Passive range of motion;Visual/perceptual remediation/compensation    PT Next Visit Plan  if able, progress x 1 viewing.  balance/gait with head/body turns.  balance training with compliant surfaces and decreased reliance on visual information    PT Home Exercise Plan  Access Code: [email or web address]    Consulted and Agree with Plan of Care  Patient       Patient will benefit from skilled therapeutic intervention in order to improve the following deficits and impairments:  Decreased balance, Dizziness, Abnormal gait, Decreased strength  Visit Diagnosis: Unsteadiness on feet  Muscle weakness  (generalized)     Problem List Patient Active Problem List   Diagnosis Date Noted  . Arthralgia 08/01/2017  . COPD with asthma ([place]) 05/04/2017  . Angina pectoris ([place]) 01/05/2017  . Insomnia 11/23/2016  . Chronic nonintractable headache 11/23/2016  . Pancreatic insufficiency 10/28/2016  . Carotid artery disease ([place]) 05/19/2016  . Fatigue 05/19/2016  . Anemia 03/27/2016  . Severe persistent asthma 03/18/2016  . Type 1 diabetes mellitus ([place]) 07/21/2015  . B12 deficiency 03/31/2015  . OSA (obstructive sleep apnea) 11/20/2014  . Fecal incontinence 08/28/2014  . 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 Falls 08/28/2014  . Neck pain of over 3 months duration 08/28/2014  . H/O total knee replacement 02/20/2014  . Obesity (BMI 30-39.9) 11/12/2013  . Chronic diastolic CHF (congestive heart failure) ([place]) 11/17/2012  . Meniscus, lateral, anterior horn derangement 11/10/2011  . Medial meniscus, posterior horn derangement 11/10/2011  . Osteoarthritis of right knee 11/10/2011  . Vertigo 10/05/2011  . HTN (hypertension), benign 10/05/2011  . Coronary artery disease 08/27/2010  . MUSCLE  CRAMPS 12/29/2009  . History of myocardial infarction 11/19/2009  . Extrinsic asthma 11/19/2009  . GERD 11/19/2009  . Cough 11/19/2009  . Hyperlipidemia 11/18/2009    Cecile Sheerer, [place] 12/26/2017, 5:55 PM  [facility] [address] [place], [place], [address] Phone: [phone]   Fax:  [phone]  Name: [name] MRN: [id number] Date of Birth: 14-Apr-1940

## 2017-12-27 LAB — HM DIABETES EYE EXAM

## 2017-12-28 ENCOUNTER — Encounter: Payer: Self-pay | Admitting: Physical Therapy

## 2017-12-28 ENCOUNTER — Ambulatory Visit: Payer: Medicare Other | Admitting: Physical Therapy

## 2017-12-28 DIAGNOSIS — R2681 Unsteadiness on feet: Secondary | ICD-10-CM

## 2017-12-28 DIAGNOSIS — M6281 Muscle weakness (generalized): Secondary | ICD-10-CM

## 2017-12-28 DIAGNOSIS — R2689 Other abnormalities of gait and mobility: Secondary | ICD-10-CM

## 2017-12-28 NOTE — Patient Instructions (Addendum)
Access Code: [email or web address]  URL: [email or web address]  Date: 12/28/2017  Prepared by: Misty Stanley   Exercises  Standing Gaze Stabilization with Head Rotation - 2 sets - 30 seconds hold - 2-3x daily - 7x weekly  Standing Gaze Stabilization with Head Nod - 2 sets - 30 second hold - 2-3x daily - 7x weekly  Standing Single Leg Stance with Unilateral Counter Support - 3 sets - 10 second hold - 1x daily - 7x weekly  Walking Tandem Stance - 10 reps - 4 sets - 1x daily - 7x weekly  Walking with Eyes Closed and Counter Support - 10 reps - 4 sets - 1x daily - 7x weekly  Standing Weight Shifting Forward and Backward - 10 reps - 2 sets - 1x daily - 7x weekly  Standing Weight Shift Side to Side - 10 reps - 2 sets - 1x daily - 7x weekly

## 2017-12-28 NOTE — Therapy (Signed)
[facility] [address] [place], [place], [address] Phone: [phone]   Fax:  [phone]  Physical Therapy Treatment and 10th visit PN  Patient Details  Name: [name] MRN: [id number] Date of Birth: 1940/12/21 Referring Provider (PT): Metta Clines, DO   Encounter Date: 12/28/2017   10th Visit Physical Therapy Progress Note  Dates of Reporting Period: 11/11/2017 to 12/28/2017   PT End of Session - 12/28/17 1631    Visit Number  10    Number of Visits  17    Date for PT Re-Evaluation  01/06/18    Authorization Type  [employer] Medicare - 10th visit PN    PT Start Time  1537    PT Stop Time  1618    PT Time Calculation (min)  41 min    Activity Tolerance  Patient tolerated treatment well    Behavior During Therapy  [facility] for tasks assessed/performed       Past Medical History:  Diagnosis Date  . Adenomatous colon polyp   . Allergy   . Anxiety   . Asthma   . Chest pain   . Chronic diastolic CHF (congestive heart failure) ([place])   . COPD (chronic obstructive pulmonary disease) ([place])    pt is unsure if has been officially diagnosed  . Coronary artery disease    CABG '09- cathed 12/09, 9/10, 6/11, 3/14 and 12/13/16- medical Rx  . Diabetes mellitus   . GERD (gastroesophageal reflux disease)   . Hiatal hernia   . Hyperlipidemia   . Hypertension   . Myocardial infarction ([place]) 2009  . PONV (postoperative nausea and vomiting)   . Schatzki's ring   . Shoulder injury    resolved after shoulder surgery  . Sleep apnea    not on cpap    Past Surgical History:  Procedure Laterality Date  . ABDOMINAL HYSTERECTOMY    . APPENDECTOMY     came out with Hysterectomy  . CARDIAC CATHETERIZATION  07/23/2009   EF 60%  . CARDIAC CATHETERIZATION  10/11/2008  . CARDIAC CATHETERIZATION  03/01/2007   EF 75-80%  . CARDIOVASCULAR STRESS TEST  11/15/2007   EF 60%  . COLONOSCOPY    . CORONARY ARTERY BYPASS GRAFT     SEVERELY  DISEASED SAPHENOUS VEIN GRAFT TO THE RIGHT CORONARY ARTERY BUT WITH FAIRLY WELL PRESERVED FLOW TO THE DISTAL RIGHT CORONARY ARTERY FROM THE NATIVE CIRCULATION-RESTART  CATH IN JUNE 2000, REVEALS MILD/MODERATE  CAD WITH GOOD FLOW DOWN HER LAD  . ESOPHAGOGASTRODUODENOSCOPY    . EYE SURGERY     bilateral cataract surgery with lens implant  . LEFT HEART CATH AND CORS/GRAFTS ANGIOGRAPHY N/A 12/14/2016   Procedure: LEFT HEART CATH AND CORS/GRAFTS ANGIOGRAPHY;  Surgeon: Jettie Booze, MD;  Location: [facility];  Service: Cardiovascular;  Laterality: N/A;  . lense removal Left   . POLYPECTOMY    . ROTATOR CUFF REPAIR     right and left  . TONSILLECTOMY     age 41  . 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 tumor removed kidney    . UPPER GASTROINTESTINAL ENDOSCOPY    . US ECHOCARDIOGRAPHY  03/08/2008   EF 55-60%    There were no vitals filed for this visit.  Subjective Assessment - 12/28/17 1541    Subjective  Might try tomorrow walking at  the [employer] again; is going to try 3 laps again.  Exercises are going better.    Pertinent History  CABG x 3, OSA, history of smoking (quit 30 y/ago), asthma, diabetic polyneuropathy, vertigo, DM, COPD, HTN, CHF, cervicogenic headaches    Patient Stated Goals  "Hopefully, be able to improve balance."    Currently in Pain?  No/denies                        Vestibular Treatment/Exercise - 12/28/17 1546      Vestibular Treatment/Exercise   Vestibular Treatment Provided  Gaze    Gaze Exercises  X1 Viewing Horizontal;X1 Viewing Vertical      X1 Viewing Horizontal   Foot Position  feet apart, no UE support    Reps  2    Comments  30-45 seconds      X1 Viewing Vertical   Foot Position  feet apart, no UE support    Reps  2    Comments  30-45 seconds.  Very mild symptoms        Access  Code: [email or web address]  URL: [email or web address]  Date: 12/28/2017  Prepared by: Misty Stanley   Exercises  Standing Gaze Stabilization with Head Rotation - 2 sets - 30 seconds hold - 2-3x daily - 7x weekly  Standing Gaze Stabilization with Head Nod - 2 sets - 30 second hold - 2-3x daily - 7x weekly  Standing Single Leg Stance with Unilateral Counter Support - 3 sets - 10 second hold - 1x daily - 7x weekly  Walking Tandem Stance - 10 reps - 4 sets - 1x daily - 7x weekly  Walking with Eyes Closed and Counter Support - 10 reps - 4 sets - 1x daily - 7x weekly  Standing Weight Shifting Forward and Backward - 10 reps - 2 sets - 1x daily - 7x weekly  Standing Weight Shift Side to Side - 10 reps - 2 sets - 1x daily - 7x weekly     PT Education - 12/28/17 1630    Education Details  added balance exercises back to HEP       PT Short Term Goals - 12/09/17 1349      PT SHORT TERM GOAL #1   Title  patient to be independent with initial HEP    Time  4    Period  Weeks    Status  Achieved      PT SHORT TERM GOAL #2   Title  patient to improve Berg to >/= 50/56 demonstrating improved balance    Baseline  53/56    Time  4    Period  Weeks    Status  Achieved      PT SHORT TERM GOAL #3   Title  Patient to improve FGA to >/= 17/30 demonstrating improved balance and functional mobility    Baseline  17/30    Time  4    Period  Weeks    Status  Achieved      PT SHORT TERM GOAL #4   Title  patient to maintain EC on compliant surfaces for up to 20 seconds without "fall"    Baseline  30 sec    Time  4    Period  Weeks    Status  Achieved        PT Long Term Goals - 12/15/17 1656      PT LONG TERM GOAL #1   Title  patient to be independent with  advanced HEP for LE strength and balance    Time  8    Period  Weeks    Status  New    Target Date  01/06/18      PT LONG TERM GOAL #2   Title  Patient to report return to [facility] 3-5x/week for cardiovascular endurance, continued  strengthening, and integration into community wellness    Time  8    Period  Weeks    Status  New    Target Date  01/06/18      PT LONG TERM GOAL #3   Title  patient to improve FGA to 21/24 demonstrating reduced fall risk    Time  8    Period  Weeks    Status  New    Target Date  01/06/18      PT LONG TERM GOAL #4   Title  vestibular assessment and goals as needed    Baseline  performed on 11/7 - no formal LTG needed    Status  Achieved      PT LONG TERM GOAL #5   Title  patient to improve SOT composite score to within age related norms    Baseline  42    Time  8    Period  Weeks    Status  New    Target Date  01/06/18            Plan - 12/28/17 1631    Clinical Impression Statement  Pt is making slow but steady progress and is now demonstrating improved tolerance of gaze adaptation exercises; today pt able to perform without UE support x 30 seconds with mild symptoms.  Pt to progress to 45 seconds over the next two weeks.  Rest of session focused on adding balance exercises back to HEP now that she is tolerating gaze adaptation.  Pt will also continue to progress walking distance.  Next session will assess progress towards LTG and determine if pt will require further visits.    Rehab Potential  Good    PT Frequency  2x / week    PT Duration  8 weeks    PT Treatment/Interventions  ADLs/Self Care Home Management;Canalith Repostioning;Therapeutic exercise;Therapeutic activities;Functional mobility training;Stair training;Gait training;Balance training;Neuromuscular re-education;Patient/family education;Manual techniques;Vestibular;Passive range of motion;Visual/perceptual remediation/compensation    PT Next Visit Plan  check LTG, renew or D/C?    PT Home Exercise Plan  Access Code: [email or web address]    Consulted and Agree with Plan of Care  Patient       Patient will benefit from skilled therapeutic intervention in order to improve the following deficits and impairments:  Decreased  balance, Dizziness, Abnormal gait, Decreased strength  Visit Diagnosis: Unsteadiness on feet  Muscle weakness (generalized)  Other abnormalities of gait and mobility     Problem List Patient Active Problem List   Diagnosis Date Noted  . Arthralgia 08/01/2017  . COPD with asthma ([place]) 05/04/2017  . Angina pectoris ([place]) 01/05/2017  . Insomnia 11/23/2016  . Chronic nonintractable headache 11/23/2016  . Pancreatic insufficiency 10/28/2016  . Carotid artery disease ([place]) 05/19/2016  . Fatigue 05/19/2016  . Anemia 03/27/2016  . Severe persistent asthma 03/18/2016  . Type 1 diabetes mellitus ([place]) 07/21/2015  . B12 deficiency 03/31/2015  . OSA (obstructive sleep apnea) 11/20/2014  . Fecal incontinence 08/28/2014  . 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 Falls 08/28/2014  . Neck pain of over 3 months duration 08/28/2014  . H/O total knee replacement 02/20/2014  . Obesity (BMI 30-39.9)  11/12/2013  . Chronic diastolic CHF (congestive heart failure) ([place]) 11/17/2012  . Meniscus, lateral, anterior horn derangement 11/10/2011  . Medial meniscus, posterior horn derangement 11/10/2011  . Osteoarthritis of right knee 11/10/2011  . Vertigo 10/05/2011  . HTN (hypertension), benign 10/05/2011  . Coronary artery disease 08/27/2010  . MUSCLE CRAMPS 12/29/2009  . History of myocardial infarction 11/19/2009  . Extrinsic asthma 11/19/2009  . GERD 11/19/2009  . Cough 11/19/2009  . Hyperlipidemia 11/18/2009    [name], PT, DPT 12/28/17    4:35 PM    [facility] [address] [place], [place], [address] Phone: [phone]   Fax:  [phone]  Name: [name] MRN: [id number] Date of Birth: 08/14/1940

## 2017-12-30 ENCOUNTER — Encounter: Payer: Self-pay | Admitting: Internal Medicine

## 2017-12-30 ENCOUNTER — Other Ambulatory Visit: Payer: Self-pay | Admitting: Internal Medicine

## 2017-12-30 MED ORDER — METFORMIN HCL 500 MG PO TABS: 500.0000 mg | ORAL_TABLET | Freq: Two times a day (BID) | ORAL | 3 refills | Status: DC

## 2018-01-01 ENCOUNTER — Other Ambulatory Visit: Payer: Self-pay | Admitting: Internal Medicine

## 2018-01-01 ENCOUNTER — Telehealth: Payer: Self-pay | Admitting: Internal Medicine

## 2018-01-01 MED ORDER — DAPAGLIFLOZIN PROPANEDIOL 5 MG PO TABS: 5.0000 mg | ORAL_TABLET | Freq: Every day | ORAL | 5 refills | Status: DC

## 2018-01-01 NOTE — Telephone Encounter (Signed)
farxiga sent to pharmacy to help better control her sugars -- if not covered she should let us know.

## 2018-01-02 NOTE — Telephone Encounter (Signed)
LVM letting pt know that farxiga was not covered under insurance and she would need to reach out to pharmacy to see what is covered and call us back.

## 2018-01-04 NOTE — Telephone Encounter (Signed)
Pa Completed for Farxiga via CoverMyMeds. Key [id number]

## 2018-01-04 NOTE — Telephone Encounter (Signed)
PA approved.

## 2018-01-11 ENCOUNTER — Ambulatory Visit: Payer: Medicare Other | Attending: Neurology | Admitting: Physical Therapy

## 2018-01-11 DIAGNOSIS — M6281 Muscle weakness (generalized): Secondary | ICD-10-CM | POA: Insufficient documentation

## 2018-01-11 DIAGNOSIS — R2689 Other abnormalities of gait and mobility: Secondary | ICD-10-CM | POA: Insufficient documentation

## 2018-01-11 DIAGNOSIS — R2681 Unsteadiness on feet: Secondary | ICD-10-CM | POA: Insufficient documentation

## 2018-01-12 ENCOUNTER — Ambulatory Visit: Payer: Medicare Other | Admitting: Physical Therapy

## 2018-01-17 ENCOUNTER — Encounter: Payer: Medicare Other | Admitting: *Deleted

## 2018-01-17 DIAGNOSIS — Z006 Encounter for examination for normal comparison and control in clinical research program: Secondary | ICD-10-CM

## 2018-01-18 ENCOUNTER — Ambulatory Visit: Payer: Medicare Other | Admitting: Physical Therapy

## 2018-01-18 ENCOUNTER — Encounter: Payer: Self-pay | Admitting: Physical Therapy

## 2018-01-18 DIAGNOSIS — R2681 Unsteadiness on feet: Secondary | ICD-10-CM

## 2018-01-18 DIAGNOSIS — M6281 Muscle weakness (generalized): Secondary | ICD-10-CM

## 2018-01-18 DIAGNOSIS — R2689 Other abnormalities of gait and mobility: Secondary | ICD-10-CM

## 2018-01-18 NOTE — Research (Signed)
Late entry:  Visit occurred 01/17/18  Subject met inclusion and exclusion criteria.  The informed consent form, study requirements and expectations were reviewed with the subject and questions and concerns were addressed prior to the signing of the consent form.  The subject verbalized understanding of the trial requirements.  The subject agreed to participate in the ORION4  trial and signed the informed consent.  The informed consent was obtained prior to performance of any protocol-specific procedures for the subject.  A copy of the signed informed consent was given to the subject and a copy was placed in the subject's medical record.  Screening visit completed and subject started in run-in phase.  Next appointment scheduled. 

## 2018-01-18 NOTE — Therapy (Signed)
[facility] [address] [place], [place], [address] Phone: [phone]   Fax:  [phone]  Physical Therapy Treatment  Patient Details  Name: [name] MRN: [id number] Date of Birth: 09-07-1940 Referring Provider (PT): Metta Clines, DO   Encounter Date: 01/18/2018  PT End of Session - 01/18/18 1632    Visit Number  11    Number of Visits  20    Date for PT Re-Evaluation  02/17/18    Authorization Type  [employer] Medicare - 10th visit PN    PT Start Time  1532    PT Stop Time  1615    PT Time Calculation (min)  43 min    Activity Tolerance  Patient tolerated treatment well    Behavior During Therapy  [facility] for tasks assessed/performed       Past Medical History:  Diagnosis Date  . Adenomatous colon polyp   . Allergy   . Anxiety   . Asthma   . Chest pain   . Chronic diastolic CHF (congestive heart failure) ([place])   . COPD (chronic obstructive pulmonary disease) ([place])    pt is unsure if has been officially diagnosed  . Coronary artery disease    CABG '09- cathed 12/09, 9/10, 6/11, 3/14 and 12/13/16- medical Rx  . Diabetes mellitus   . GERD (gastroesophageal reflux disease)   . Hiatal hernia   . Hyperlipidemia   . Hypertension   . Myocardial infarction ([place]) 2009  . PONV (postoperative nausea and vomiting)   . Schatzki's ring   . Shoulder injury    resolved after shoulder surgery  . Sleep apnea    not on cpap    Past Surgical History:  Procedure Laterality Date  . ABDOMINAL HYSTERECTOMY    . APPENDECTOMY     came out with Hysterectomy  . CARDIAC CATHETERIZATION  07/23/2009   EF 60%  . CARDIAC CATHETERIZATION  10/11/2008  . CARDIAC CATHETERIZATION  03/01/2007   EF 75-80%  . CARDIOVASCULAR STRESS TEST  11/15/2007   EF 60%  . COLONOSCOPY    . CORONARY ARTERY BYPASS GRAFT     SEVERELY DISEASED SAPHENOUS VEIN GRAFT TO THE RIGHT CORONARY ARTERY BUT WITH FAIRLY WELL PRESERVED FLOW TO THE DISTAL RIGHT CORONARY  ARTERY FROM THE NATIVE CIRCULATION-RESTART  CATH IN JUNE 2000, REVEALS MILD/MODERATE  CAD WITH GOOD FLOW DOWN HER LAD  . ESOPHAGOGASTRODUODENOSCOPY    . EYE SURGERY     bilateral cataract surgery with lens implant  . LEFT HEART CATH AND CORS/GRAFTS ANGIOGRAPHY N/A 12/14/2016   Procedure: LEFT HEART CATH AND CORS/GRAFTS ANGIOGRAPHY;  Surgeon: Jettie Booze, MD;  Location: [facility];  Service: Cardiovascular;  Laterality: N/A;  . lense removal Left   . POLYPECTOMY    . ROTATOR CUFF REPAIR     right and left  . TONSILLECTOMY     age 77  . 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 tumor removed kidney    . UPPER GASTROINTESTINAL ENDOSCOPY    . US ECHOCARDIOGRAPHY  03/08/2008   EF 55-60%    There were no vitals filed for this visit.  Subjective Assessment - 01/18/18 1535    Subjective  Started in a cholesterol study where she receives an injection every few months.  Is up to 3 laps at the [facility] and feels good afterwards.  Pertinent History  CABG x 3, OSA, history of smoking (quit 30 y/ago), asthma, diabetic polyneuropathy, vertigo, DM, COPD, HTN, CHF, cervicogenic headaches    Patient Stated Goals  "Hopefully, be able to improve balance."    Currently in Pain?  No/denies         [facility] PT Assessment - 01/18/18 1540      Functional Gait  Assessment   Gait assessed   Yes    Gait Level Surface  Walks 20 ft in less than 7 sec but greater than 5.5 sec, uses assistive device, slower speed, mild gait deviations, or deviates 6-10 in outside of the 12 in walkway width.    Change in Gait Speed  Able to smoothly change walking speed without loss of balance or gait deviation. Deviate no more than 6 in outside of the 12 in walkway width.    Gait with Horizontal Head Turns  Performs head turns smoothly with slight change in gait velocity (eg, minor  disruption to smooth gait path), deviates 6-10 in outside 12 in walkway width, or uses an assistive device.    Gait with Vertical Head Turns  Performs head turns with no change in gait. Deviates no more than 6 in outside 12 in walkway width.    Gait and Pivot Turn  Pivot turns safely in greater than 3 sec and stops with no loss of balance, or pivot turns safely within 3 sec and stops with mild imbalance, requires small steps to catch balance.    Step Over Obstacle  Is able to step over one shoe box (4.5 in total height) without changing gait speed. No evidence of imbalance.    Gait with Narrow Base of Support  Ambulates less than 4 steps heel to toe or cannot perform without assistance.    Gait with Eyes Closed  Walks 20 ft, uses assistive device, slower speed, mild gait deviations, deviates 6-10 in outside 12 in walkway width. Ambulates 20 ft in less than 9 sec but greater than 7 sec.    Ambulating Backwards  Walks 20 ft, uses assistive device, slower speed, mild gait deviations, deviates 6-10 in outside 12 in walkway width.    Steps  Alternating feet, no rail.    Total Score  21    FGA comment:  21/30         Vestibular Assessment - 01/18/18 1548      Balancemaster   Engineer, materials Comment  composite score of 50       Sensory Organization Testing=50% compared to age/height normative value of 65%.   Patient demonstrated [facility] use of somatosensory feedback for balance, 35% use of visual feedback for balance, and 23% use of vestibular feedback for balance improved from previous testing but still below normal limits.     PT Education - 01/18/18 1631    Education Details  progress towards goals and plan to recertify x 4 more weeks    Person(s) Educated  Patient    Methods  Explanation    Comprehension  Verbalized understanding       PT Short Term Goals - 12/09/17 1349      PT SHORT TERM GOAL #1   Title  patient to be independent with  initial HEP    Time  4    Period  Weeks    Status  Achieved      PT SHORT TERM GOAL #2   Title  patient to improve Berg to >/=  50/56 demonstrating improved balance    Baseline  53/56    Time  4    Period  Weeks    Status  Achieved      PT SHORT TERM GOAL #3   Title  Patient to improve FGA to >/= 17/30 demonstrating improved balance and functional mobility    Baseline  17/30    Time  4    Period  Weeks    Status  Achieved      PT SHORT TERM GOAL #4   Title  patient to maintain EC on compliant surfaces for up to 20 seconds without "fall"    Baseline  30 sec    Time  4    Period  Weeks    Status  Achieved        PT Long Term Goals - 01/18/18 1539      PT LONG TERM GOAL #1   Title  patient to be independent with advanced HEP for LE strength and balance    Time  8    Period  Weeks    Status  Achieved      PT LONG TERM GOAL #2   Title  Patient to report return to [facility] 3-5x/week for cardiovascular endurance, continued strengthening, and integration into community wellness    Baseline  3x/week at [facility]    Time  8    Period  Weeks    Status  Achieved      PT LONG TERM GOAL #3   Title  patient to improve FGA to 21/24 demonstrating reduced fall risk    Baseline  21/30    Time  8    Period  Weeks    Status  Achieved      PT LONG TERM GOAL #4   Title  vestibular assessment and goals as needed    Baseline  performed on 11/7 - no formal LTG needed    Status  Achieved      PT LONG TERM GOAL #5   Title  patient to improve SOT composite score to within age related norms    Baseline  42 >> 50    Time  8    Period  Weeks    Status  Partially Met       New goals for recertification: PT Short Term Goals - 01/18/18 1639      PT SHORT TERM GOAL #1   Title  = LTG      PT Long Term Goals - 01/18/18 1639      PT LONG TERM GOAL #1   Title  patient to be independent with advanced HEP for LE strength and balance    Time  4    Period  Weeks    Status  New    Target Date   02/17/18      PT LONG TERM GOAL #2   Title  Pt will report being able to ambulate >3 laps around track at [facility] 2-3x/week with minimal symptoms    Baseline  3x/week at [facility]    Time  4    Period  Weeks    Status  Revised    Target Date  02/17/18      PT LONG TERM GOAL #3   Title  patient to improve FGA to >/=24/30 demonstrating reduced fall risk    Baseline  21/30    Time  4    Period  Weeks    Status  Revised    Target Date  02/17/18      PT LONG TERM GOAL #5   Title  patient to improve SOT composite score to within age related norms    Baseline  50    Time  4    Period  Weeks    Status  Revised    Target Date  02/17/18           Plan - 01/18/18 1618    Clinical Impression Statement  Performed assessment of progress towards LTG.  Pt is making steady progress and has met 4/5 LTG and is demonstrating improved balance during gait, decreased falls risk, improved activity tolerance with decreased motion sensitivity and improved use of all sensory systems for balance as indicated by SOT.  Pt continues to demonstrate impaired use of hip strategy and vestibular system as indicated by multiple falls on conditions where vision was removed and surface was unstable.  During gait pt also continues to demonstrate increased falls risk with narrow BOS and with head turns.  Pt will benefit from continued skilled PT services to address these impairments to maximize functional mobility independence and decrease falls risk.    Rehab Potential  Good    PT Frequency  2x / week    PT Duration  4 weeks    PT Treatment/Interventions  ADLs/Self Care Home Management;Canalith Repostioning;Therapeutic exercise;Therapeutic activities;Functional mobility training;Stair training;Gait training;Balance training;Neuromuscular re-education;Patient/family education;Manual techniques;Vestibular;Passive range of motion;Visual/perceptual remediation/compensation    PT Next Visit Plan  continue higher level balance  training focusing on eyes closed, compliant/unstable surfaces.  Balance reactions especially use of hip strategy.  balance while stepping over objects    PT Home Exercise Plan  Access Code: [email or web address]    Consulted and Agree with Plan of Care  Patient       Patient will benefit from skilled therapeutic intervention in order to improve the following deficits and impairments:  Decreased balance, Dizziness, Abnormal gait, Decreased strength, Difficulty walking, Decreased activity tolerance  Visit Diagnosis: Unsteadiness on feet  Muscle weakness (generalized)  Other abnormalities of gait and mobility     Problem List Patient Active Problem List   Diagnosis Date Noted  . Arthralgia 08/01/2017  . COPD with asthma ([place]) 05/04/2017  . Angina pectoris ([place]) 01/05/2017  . Insomnia 11/23/2016  . Chronic nonintractable headache 11/23/2016  . Pancreatic insufficiency 10/28/2016  . Carotid artery disease ([place]) 05/19/2016  . Fatigue 05/19/2016  . Anemia 03/27/2016  . Severe persistent asthma 03/18/2016  . Type 1 diabetes mellitus ([place]) 07/21/2015  . B12 deficiency 03/31/2015  . OSA (obstructive sleep apnea) 11/20/2014  . Fecal incontinence 08/28/2014  . 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 Falls 08/28/2014  . Neck pain of over 3 months duration 08/28/2014  . H/O total knee replacement 02/20/2014  . Obesity (BMI 30-39.9) 11/12/2013  . Chronic diastolic CHF (congestive heart failure) ([place]) 11/17/2012  . Meniscus, lateral, anterior horn derangement 11/10/2011  . Medial meniscus, posterior horn derangement 11/10/2011  . Osteoarthritis of right knee 11/10/2011  . Vertigo 10/05/2011  . HTN (hypertension), benign 10/05/2011  . Coronary artery disease 08/27/2010  . MUSCLE CRAMPS 12/29/2009  . History of myocardial infarction 11/19/2009  . Extrinsic asthma 11/19/2009  . GERD 11/19/2009  . Cough 11/19/2009  . Hyperlipidemia 11/18/2009   [name], PT, DPT 01/18/18    4:44  PM    [facility] [address] [place], [place], [address] Phone: [phone]   Fax:  [phone]  Name: [name] MRN: [id number]  Date of Birth: [birth year]

## 2018-01-19 ENCOUNTER — Encounter: Payer: Self-pay | Admitting: *Deleted

## 2018-01-19 DIAGNOSIS — Z006 Encounter for examination for normal comparison and control in clinical research program: Secondary | ICD-10-CM

## 2018-01-19 NOTE — Research (Signed)
Late entry: Screening 01/17/18 Subject met inclusion and exclusion criteria.  The informed consent form, study requirements and expectations were reviewed with the subject and questions and concerns were addressed prior to the signing of the consent form.  The subject verbalized understanding of the trial requirements.  The subject agreed to participate in the [place] 4 trial and signed the informed consent.  The informed consent was obtained prior to performance of any protocol-specific procedures for the subject.  A copy of the signed informed consent was given to the subject and a copy was placed in the subject's medical record.  Subject screened and randomization appointment scheduled.

## 2018-01-20 ENCOUNTER — Encounter: Payer: Self-pay | Admitting: Physical Therapy

## 2018-01-20 ENCOUNTER — Ambulatory Visit: Payer: Medicare Other | Admitting: Physical Therapy

## 2018-01-20 ENCOUNTER — Ambulatory Visit: Payer: Self-pay | Admitting: *Deleted

## 2018-01-20 ENCOUNTER — Other Ambulatory Visit: Payer: Self-pay | Admitting: Cardiovascular Disease

## 2018-01-20 DIAGNOSIS — R2681 Unsteadiness on feet: Secondary | ICD-10-CM | POA: Diagnosis not present

## 2018-01-20 NOTE — Therapy (Signed)
[facility] [address] [place], [place], [address] Phone: [phone]   Fax:  [phone]  Physical Therapy Treatment  Patient Details  Name: [name] MRN: [id number] Date of Birth: 07/19/1940 Referring Provider (PT): Metta Clines, DO   Encounter Date: 01/20/2018  PT End of Session - 01/20/18 1536    Visit Number  12    Number of Visits  20    Date for PT Re-Evaluation  02/17/18    Authorization Type  [employer] Medicare - 10th visit PN    PT Start Time  1532    PT Stop Time  1615    PT Time Calculation (min)  43 min    Activity Tolerance  Patient tolerated treatment well    Behavior During Therapy  [facility] for tasks assessed/performed       Past Medical History:  Diagnosis Date  . Adenomatous colon polyp   . Allergy   . Anxiety   . Asthma   . Chest pain   . Chronic diastolic CHF (congestive heart failure) ([place])   . COPD (chronic obstructive pulmonary disease) ([place])    pt is unsure if has been officially diagnosed  . Coronary artery disease    CABG '09- cathed 12/09, 9/10, 6/11, 3/14 and 12/13/16- medical Rx  . Diabetes mellitus   . GERD (gastroesophageal reflux disease)   . Hiatal hernia   . Hyperlipidemia   . Hypertension   . Myocardial infarction ([place]) 2009  . PONV (postoperative nausea and vomiting)   . Schatzki's ring   . Shoulder injury    resolved after shoulder surgery  . Sleep apnea    not on cpap    Past Surgical History:  Procedure Laterality Date  . ABDOMINAL HYSTERECTOMY    . APPENDECTOMY     came out with Hysterectomy  . CARDIAC CATHETERIZATION  07/23/2009   EF 60%  . CARDIAC CATHETERIZATION  10/11/2008  . CARDIAC CATHETERIZATION  03/01/2007   EF 75-80%  . CARDIOVASCULAR STRESS TEST  11/15/2007   EF 60%  . COLONOSCOPY    . CORONARY ARTERY BYPASS GRAFT     SEVERELY DISEASED SAPHENOUS VEIN GRAFT TO THE RIGHT CORONARY ARTERY BUT WITH FAIRLY WELL PRESERVED FLOW TO THE DISTAL RIGHT CORONARY  ARTERY FROM THE NATIVE CIRCULATION-RESTART  CATH IN JUNE 2000, REVEALS MILD/MODERATE  CAD WITH GOOD FLOW DOWN HER LAD  . ESOPHAGOGASTRODUODENOSCOPY    . EYE SURGERY     bilateral cataract surgery with lens implant  . LEFT HEART CATH AND CORS/GRAFTS ANGIOGRAPHY N/A 12/14/2016   Procedure: LEFT HEART CATH AND CORS/GRAFTS ANGIOGRAPHY;  Surgeon: Jettie Booze, MD;  Location: [facility];  Service: Cardiovascular;  Laterality: N/A;  . lense removal Left   . POLYPECTOMY    . ROTATOR CUFF REPAIR     right and left  . TONSILLECTOMY     age 31  . 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 tumor removed kidney    . UPPER GASTROINTESTINAL ENDOSCOPY    . US ECHOCARDIOGRAPHY  03/08/2008   EF 55-60%    There were no vitals filed for this visit.  Subjective Assessment - 01/20/18 1643    Subjective  Reports she is doing her exercises faithfully and is seeing her balance improve.    Pertinent History  CABG x 3, OSA, history of smoking (  quit 30 y/ago), asthma, diabetic polyneuropathy, vertigo, DM, COPD, HTN, CHF, cervicogenic headaches    Patient Stated Goals  "Hopefully, be able to improve balance."    Currently in Pain?  No/denies                            Balance Exercises - 01/20/18 1631      Balance Exercises: Standing   Standing Eyes Closed  Narrow base of support (BOS);Head turns;Foam/compliant surface   blue mat in // bars; horiz & vertical    SLS with Vectors  Foam/compliant surface;Intermittent upper extremity assist   pt on blue mat, cones on solid floor; cone taps   Wall Bumps  Hip    Wall Bumps-Hips  Eyes opened;Anterior/posterior;Foam/compliant surface;10 reps    Stepping Strategy  Anterior;Posterior;UE support;10 reps   at sink with intermittent UE support   Balance Beam  gray/red beam in // bars, feet  together; working on stepping strategy when LOB     Tandem Gait  Forward;Intermittent upper extremity support;Foam/compliant surface    Retro Gait  Foam/compliant surface    Sidestepping  Foam/compliant support    Step Over Hurdles / Cones  low hurdles on blue mat; anterior, sideways        PT Education - 01/20/18 1629    Education Details  addition to HEP (backwards step at sink); reviewed stepping strategy and big step    Person(s) Educated  Patient    Methods  Explanation;Demonstration;Verbal cues;Handout    Comprehension  Verbalized understanding;Returned demonstration;Verbal cues required;Need further instruction       PT Short Term Goals - 01/18/18 1639      PT SHORT TERM GOAL #1   Title  = LTG        PT Long Term Goals - 01/18/18 1639      PT LONG TERM GOAL #1   Title  patient to be independent with advanced HEP for LE strength and balance    Time  4    Period  Weeks    Status  New    Target Date  02/17/18      PT LONG TERM GOAL #2   Title  Pt will report being able to ambulate >3 laps around track at [facility] 2-3x/week with minimal symptoms    Baseline  3x/week at [facility]    Time  4    Period  Weeks    Status  Revised    Target Date  02/17/18      PT LONG TERM GOAL #3   Title  patient to improve FGA to >/=24/30 demonstrating reduced fall risk    Baseline  21/30    Time  4    Period  Weeks    Status  Revised    Target Date  02/17/18      PT LONG TERM GOAL #5   Title  patient to improve SOT composite score to within age related norms    Baseline  50    Time  4    Period  Weeks    Status  Revised    Target Date  02/17/18            Plan - 01/20/18 1638    Clinical Impression Statement  Session focused on balance training: various standing surfaces, EO, EC, head turns, stepping over objects for incr SLS. Educated on ankle vs hip vs step strategy and performed tasks to elicit LOB and require her to  use hip (and if needed, step) strategy. Patient does well  with trying not to use UE support and balance is improving. Will continue to benefit from PT    Rehab Potential  Good    PT Frequency  2x / week    PT Duration  4 weeks    PT Treatment/Interventions  ADLs/Self Care Home Management;Canalith Repostioning;Therapeutic exercise;Therapeutic activities;Functional mobility training;Stair training;Gait training;Balance training;Neuromuscular re-education;Patient/family education;Manual techniques;Vestibular;Passive range of motion;Visual/perceptual remediation/compensation    PT Next Visit Plan  chk if ?'s with addition to HEP 12/13; continue higher level balance training focusing on eyes closed, compliant/unstable surfaces.  Balance reactions especially use of hip strategy.  balance while stepping over objects    PT Home Exercise Plan  Access Code: [email or web address]    Consulted and Agree with Plan of Care  Patient       Patient will benefit from skilled therapeutic intervention in order to improve the following deficits and impairments:  Decreased balance, Dizziness, Abnormal gait, Decreased strength, Difficulty walking, Decreased activity tolerance  Visit Diagnosis: Unsteadiness on feet     Problem List Patient Active Problem List   Diagnosis Date Noted  . Arthralgia 08/01/2017  . COPD with asthma ([place]) 05/04/2017  . Angina pectoris ([place]) 01/05/2017  . Insomnia 11/23/2016  . Chronic nonintractable headache 11/23/2016  . Pancreatic insufficiency 10/28/2016  . Carotid artery disease ([place]) 05/19/2016  . Fatigue 05/19/2016  . Anemia 03/27/2016  . Severe persistent asthma 03/18/2016  . Type 1 diabetes mellitus ([place]) 07/21/2015  . B12 deficiency 03/31/2015  . OSA (obstructive sleep apnea) 11/20/2014  . Fecal incontinence 08/28/2014  . 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 Falls 08/28/2014  . Neck pain of over 3 months duration 08/28/2014  . H/O total knee replacement 02/20/2014  . Obesity (BMI 30-39.9) 11/12/2013  . Chronic diastolic CHF  (congestive heart failure) ([place]) 11/17/2012  . Meniscus, lateral, anterior horn derangement 11/10/2011  . Medial meniscus, posterior horn derangement 11/10/2011  . Osteoarthritis of right knee 11/10/2011  . Vertigo 10/05/2011  . HTN (hypertension), benign 10/05/2011  . Coronary artery disease 08/27/2010  . MUSCLE CRAMPS 12/29/2009  . History of myocardial infarction 11/19/2009  . Extrinsic asthma 11/19/2009  . GERD 11/19/2009  . Cough 11/19/2009  . Hyperlipidemia 11/18/2009    Rexanne Mano, PT 01/20/2018, 4:43 PM  [facility] [address] [place], [place], [address] Phone: [phone]   Fax:  [phone]  Name: [name] MRN: [id number] Date of Birth: 12-Apr-1940

## 2018-01-20 NOTE — Patient Instructions (Signed)
Access Code: [email or web address]  URL: [email or web address]  Date: 01/20/2018  Prepared by: Barry Brunner   Exercises  Standing Gaze Stabilization with Head Rotation - 2 sets - 30 seconds hold - 2-3x daily - 7x weekly  Standing Gaze Stabilization with Head Nod - 2 sets - 30 second hold - 2-3x daily - 7x weekly  Standing Single Leg Stance with Unilateral Counter Support - 3 sets - 10 second hold - 1x daily - 7x weekly  Walking Tandem Stance - 10 reps - 4 sets - 1x daily - 7x weekly  Walking with Eyes Closed and Counter Support - 10 reps - 4 sets - 1x daily - 7x weekly  Standing Weight Shifting Forward and Backward - 10 reps - 2 sets - 1x daily - 7x weekly  Standing Weight Shift Side to Side - 10 reps - 2 sets - 1x daily - 7x weekly  Alternating Step Backward with Support - 10 reps - 2 sets - - hold - 1x daily - 7x weekly

## 2018-01-20 NOTE — Telephone Encounter (Signed)
Benjamine Mola, RN who works with the [employer] monitoring program via [place] [facility] calling to report that the pt has been experiencing shortness of breath at rest, with activity and is feeling extremely fatigued. Benjamine Mola reports that the pt is not able to afford her medications and was ordered a nebulizer but can't afford to the payments. Pt can not afford albuterol, famotidine or Humalog. Benjamine Mola reports that she did contact the pharmacy regarding the Famotidine which is $21 and Humalog which is $35 and the pt can not afford either medication. Benjamine Mola reports that the pt has been using Trelegy 3 times a day. Benjamine Mola states that the pt is unhappy about her situation and not being able to afford the medications. Elizabeth,RN calling to report information to PCP to see if other medications could be ordered that are more affordable. Pt called and pt states that she was more short of breath today than she has been all week. Pt states that she has used her nebulizer one time today.Pt states that "when the weather changes, I change". Pt denies any chest pain or other symptoms at this time. Pt has appt scheduled with Dr. Quay Burow on 01/24/18. Pt advised that if symptoms become worse to return call to the office or to seek treatment in the ED. Pt verbalized understanding.   Reason for Disposition . [1] Caller requests to speak ONLY to PCP AND [2] urgent question  Answer Assessment - Initial Assessment Questions 1. REASON FOR CALL or QUESTION: "What is your reason for calling today?" or "How can I best help you?" or "What question do you have that I can help answer?"     Benjamine Mola calling to report that the pt has been experiencing SOB with rest, activity and is feeling extremely fatigued.  2. CALLER: Document the source of call. (e.g., laboratory, patient).     Elizabeth,RN with [place], phone case management  Protocols used: PCP CALL - NO TRIAGE-A-AH

## 2018-01-22 NOTE — Telephone Encounter (Signed)
Call her - can she not afford her medication right now because she is in the donut hole and will be ok once January hits or is this a chronic problem and we need to do something different now and next year?

## 2018-01-23 NOTE — Telephone Encounter (Signed)
LVM for pt to call back in regards.  

## 2018-01-24 ENCOUNTER — Ambulatory Visit (INDEPENDENT_AMBULATORY_CARE_PROVIDER_SITE_OTHER): Payer: Medicare Other | Admitting: *Deleted

## 2018-01-24 ENCOUNTER — Encounter: Payer: Self-pay | Admitting: Internal Medicine

## 2018-01-24 ENCOUNTER — Other Ambulatory Visit (INDEPENDENT_AMBULATORY_CARE_PROVIDER_SITE_OTHER): Payer: Medicare Other

## 2018-01-24 ENCOUNTER — Telehealth: Payer: Self-pay

## 2018-01-24 ENCOUNTER — Ambulatory Visit (INDEPENDENT_AMBULATORY_CARE_PROVIDER_SITE_OTHER): Payer: Medicare Other | Admitting: Internal Medicine

## 2018-01-24 VITALS — BP 152/88 | Resp 16 | Ht 62.0 in | Wt 200.0 lb

## 2018-01-24 VITALS — BP 152/88 | Temp 98.5°F | Resp 16 | Ht 62.0 in | Wt 200.0 lb

## 2018-01-24 DIAGNOSIS — I10 Essential (primary) hypertension: Secondary | ICD-10-CM | POA: Diagnosis not present

## 2018-01-24 DIAGNOSIS — K219 Gastro-esophageal reflux disease without esophagitis: Secondary | ICD-10-CM | POA: Diagnosis not present

## 2018-01-24 DIAGNOSIS — R300 Dysuria: Secondary | ICD-10-CM

## 2018-01-24 DIAGNOSIS — Z0000 Encounter for general adult medical examination without abnormal findings: Secondary | ICD-10-CM

## 2018-01-24 DIAGNOSIS — G4709 Other insomnia: Secondary | ICD-10-CM

## 2018-01-24 DIAGNOSIS — E1059 Type 1 diabetes mellitus with other circulatory complications: Secondary | ICD-10-CM | POA: Diagnosis not present

## 2018-01-24 DIAGNOSIS — E785 Hyperlipidemia, unspecified: Secondary | ICD-10-CM | POA: Diagnosis not present

## 2018-01-24 LAB — URINALYSIS, ROUTINE W REFLEX MICROSCOPIC
BILIRUBIN URINE: NEGATIVE
Hgb urine dipstick: NEGATIVE
KETONES UR: NEGATIVE
LEUKOCYTES UA: NEGATIVE
Nitrite: POSITIVE — AB
PH: 6.5 (ref 5.0–8.0)
RBC / HPF: NONE SEEN (ref 0–?)
Specific Gravity, Urine: 1.01 (ref 1.000–1.030)
Total Protein, Urine: NEGATIVE
UROBILINOGEN UA: 0.2 (ref 0.0–1.0)
Urine Glucose: NEGATIVE

## 2018-01-24 NOTE — Telephone Encounter (Signed)
Copied from [place] [id number]. Topic: General - Other >> Jan 23, 2018  3:47 PM Yvette Rack wrote: Reason for CRM: Pt returned call to office. Pt requests call back. Cb# [phone]

## 2018-01-24 NOTE — Patient Instructions (Addendum)
  Medications reviewed and updated.  Changes include :   none    Please followup in 6 months   

## 2018-01-24 NOTE — Progress Notes (Addendum)
Subjective:   [name] is a 77 y.o. female who presents for Medicare Annual (Subsequent) preventive examination.  Review of Systems:  No ROS.  Medicare Wellness Visit. Additional risk factors are reflected in the social history.  Cardiac Risk Factors include: diabetes mellitus;dyslipidemia;hypertension Sleep patterns: has interrupted sleep, gets up 1-2 times nightly to void and sleeps 5-6 hours nightly. Patient reports insomnia issues, discussed recommended sleep tips and stress reduction tips, education was attached to patient's AVS.   Home Safety/Smoke Alarms: Feels safe in home. Smoke alarms in place.  Living environment; residence and Firearm Safety: 1-story house/ trailer, no firearms. Lives alone, no needs for DME, limited support system Seat Belt Safety/Bike Helmet: Wears seat belt.     Objective:     Vitals: BP (!) 152/88   Resp 16   Ht '5\' 2"'  (1.575 m)   Wt 200 lb (90.7 kg)   LMP  (LMP Unknown)   BMI 36.58 kg/m   Body mass index is 36.58 kg/m.  Advanced Directives 01/24/2018 11/11/2017 01/14/2017 01/12/2017 01/06/2017 01/04/2017 12/14/2016  Does Patient Have a Medical Advance Directive? Yes Yes Yes Yes No No No  Type of Paramedic of [place];Living will - [place];Living will - - - -  Does patient want to make changes to medical advance directive? - No - Patient declined - - - - -  Copy of [place] in Chart? No - copy requested - No - copy requested - - - -  Would patient like information on creating a medical advance directive? - - - - No - Patient declined - No - Patient declined  Pre-existing out of facility DNR order (yellow form or pink MOST form) - - - - - - -    Tobacco Social History   Tobacco Use  Smoking Status Former Smoker  . Packs/day: 0.50  . Years: 21.00  . Pack years: 10.50  . Types: Cigarettes  . Start date: 02/09/1955  . Last attempt to quit: 02/09/1976  . Years since quitting:  41.9  Smokeless Tobacco Never Used     Counseling given: Not Answered  Past Medical History:  Diagnosis Date  . Adenomatous colon polyp   . Allergy   . Anxiety   . Asthma   . Chest pain   . Chronic diastolic CHF (congestive heart failure) ([place])   . COPD (chronic obstructive pulmonary disease) ([place])    pt is unsure if has been officially diagnosed  . Coronary artery disease    CABG '09- cathed 12/09, 9/10, 6/11, 3/14 and 12/13/16- medical Rx  . Diabetes mellitus   . GERD (gastroesophageal reflux disease)   . Hiatal hernia   . Hyperlipidemia   . Hypertension   . Myocardial infarction ([place]) 2009  . PONV (postoperative nausea and vomiting)   . Schatzki's ring   . Shoulder injury    resolved after shoulder surgery  . Sleep apnea    not on cpap   Past Surgical History:  Procedure Laterality Date  . ABDOMINAL HYSTERECTOMY    . APPENDECTOMY     came out with Hysterectomy  . CARDIAC CATHETERIZATION  07/23/2009   EF 60%  . CARDIAC CATHETERIZATION  10/11/2008  . CARDIAC CATHETERIZATION  03/01/2007   EF 75-80%  . CARDIOVASCULAR STRESS TEST  11/15/2007   EF 60%  . COLONOSCOPY    . CORONARY ARTERY BYPASS GRAFT     SEVERELY DISEASED SAPHENOUS VEIN GRAFT TO THE RIGHT CORONARY ARTERY  BUT WITH FAIRLY WELL PRESERVED FLOW TO THE DISTAL RIGHT CORONARY ARTERY FROM THE NATIVE CIRCULATION-RESTART  CATH IN JUNE 2000, REVEALS MILD/MODERATE  CAD WITH GOOD FLOW DOWN HER LAD  . ESOPHAGOGASTRODUODENOSCOPY    . EYE SURGERY     bilateral cataract surgery with lens implant  . LEFT HEART CATH AND CORS/GRAFTS ANGIOGRAPHY N/A 12/14/2016   Procedure: LEFT HEART CATH AND CORS/GRAFTS ANGIOGRAPHY;  Surgeon: Jettie Booze, MD;  Location: [facility];  Service: Cardiovascular;  Laterality: N/A;  . lense removal Left   . POLYPECTOMY    . ROTATOR CUFF REPAIR     right and left  . TONSILLECTOMY     age 38  . TOTAL KNEE ARTHROPLASTY Right 02/20/2014   Procedure: RIGHT TOTAL KNEE ARTHROPLASTY;   Surgeon: Tobi Bastos, MD;  Location: [facility];  Service: Orthopedics;  Laterality: Right;  . tumor removed kidney    . UPPER GASTROINTESTINAL ENDOSCOPY    . US ECHOCARDIOGRAPHY  03/08/2008   EF 55-60%   Family History  Problem Relation Age of Onset  . Heart disease Maternal Grandfather   . Heart failure Maternal Grandfather   . Diabetes Maternal Grandfather   . Heart attack Father   . Diabetes Mother   . Rheum arthritis Sister   . Emphysema Paternal Uncle   . Esophageal cancer Brother 56       she said he was born with it  . Emphysema Paternal Aunt   . Neuropathy Neg Hx   . Multiple sclerosis Neg Hx   . Colon cancer Neg Hx   . Colon polyps Neg Hx   . Rectal cancer Neg Hx   . Stomach cancer Neg Hx    Social History   Socioeconomic History  . Marital status: Widowed    Spouse name: Not on file  . Number of children: 4  . Years of education: Designer, jewellery  . Highest education level: Not on file  Occupational History  . Occupation: Retired  Scientific laboratory technician  . Financial resource strain: Very hard  . Food insecurity:    Worry: Sometimes true    Inability: Sometimes true  . Transportation needs:    Medical: No    Non-medical: No  Tobacco Use  . Smoking status: Former Smoker    Packs/day: 0.50    Years: 21.00    Pack years: 10.50    Types: Cigarettes    Start date: 02/09/1955    Last attempt to quit: 02/09/1976    Years since quitting: 41.9  . Smokeless tobacco: Never Used  Substance and Sexual Activity  . Alcohol use: No    Alcohol/week: 0.0 standard drinks  . Drug use: No  . Sexual activity: Never  Lifestyle  . Physical activity:    Days per week: 3 days    Minutes per session: 40 min  . Stress: Only a little  Relationships  . Social connections:    Talks on phone: More than three times a week    Gets together: More than three times a week    Attends religious service: More than 4 times per year    Active member of club or organization: Yes    Attends meetings of  clubs or organizations: More than 4 times per year    Relationship status: Widowed  Other Topics Concern  . Not on file  Social History Narrative   Lives alone.  One story home.  Has 4 children.  Education: doctorate in theology.    Caffeine  use: Drinks 1 cup coffee/day      Originally from [place]. Previously has lived in [place]. Prior travel to [place], [place], [place], [place], [place], [place], [place], & [place]. Previously worked in [employer] She has a dog currently. No bird, mold, or hot tub exposure. She also pastors a church.     Outpatient Encounter Medications as of 01/24/2018  Medication Sig  . albuterol (PROVENTIL HFA;VENTOLIN HFA) 108 (90 BASE) MCG/ACT inhaler Inhale 2 puffs into the lungs every 6 (six) hours as needed. For shortness of breath.  Marland Kitchen albuterol (PROVENTIL) (2.5 MG/3ML) 0.083% nebulizer solution Take 3 mLs (2.5 mg total) by nebulization every 6 (six) hours as needed for wheezing or shortness of breath.  Marland Kitchen alum & mag hydroxide-simeth (MAALOX/MYLANTA) 200-200-20 MG/5ML suspension Take 30 mLs as needed by mouth for indigestion or heartburn.  Marland Kitchen aspirin EC 81 MG tablet Take 1 tablet (81 mg total) by mouth daily.  . baclofen (LIORESAL) 20 MG tablet Take 1 tablet (20 mg total) by mouth 3 (three) times daily.  . Blood Glucose Monitoring Suppl (ONE TOUCH ULTRA MINI) w/Device KIT 3 (three) times daily. for testing  . budesonide-formoterol (SYMBICORT) 160-4.5 MCG/ACT inhaler Inhale 2 puffs into the lungs 2 (two) times daily.  . calcium carbonate (TUMS - DOSED IN MG ELEMENTAL CALCIUM) 500 MG chewable tablet Chew 2 tablets daily as needed by mouth for indigestion or heartburn.  . Cholecalciferol (VITAMIN D) 2000 UNITS tablet Take 2,000 Units by mouth daily.   . clonazePAM (KLONOPIN) 0.5 MG tablet Take 0.5 mg daily by mouth.  . CREON 24000-76000 units CPEP TAKE 2 CAPSULES BY MOUTH 3 TIMES A DAY WITH MEALS (TAK 1 CAPSULE WITH A SNACK)  . dapagliflozin propanediol (FARXIGA) 5 MG TABS  tablet Take 5 mg by mouth daily.  . diclofenac sodium (VOLTAREN) 1 % GEL Apply 2 g topically 4 (four) times daily as needed (muscle pain).  Marland Kitchen diltiazem (CARDIZEM CD) 240 MG 24 hr capsule TAKE 1 CAPSULE BY MOUTH EVERY DAY  . ezetimibe (ZETIA) 10 MG tablet Take 10 mg by mouth daily.  . famotidine (PEPCID) 40 MG tablet Take 1 tablet (40 mg total) by mouth daily as needed.  . fexofenadine (ALLEGRA) 180 MG tablet Take 1 tablet (180 mg total) by mouth daily. (Patient taking differently: Take 180 mg by mouth daily as needed. )  . fluticasone (FLONASE) 50 MCG/ACT nasal spray Place 2 sprays into both nostrils daily.  . Fluticasone-Umeclidin-Vilant (TRELEGY ELLIPTA) 100-62.5-25 MCG/INH AEPB Inhale 1 puff into the lungs daily.  . furosemide (LASIX) 40 MG tablet Take 1 tablet (40 mg total) by mouth daily.  . insulin lispro protamine-insulin lispro (HUMALOG 75/25) (75-25) 100 UNIT/ML SUSP Inject 15-22 Units into the skin See admin instructions. Takes 15 units in the morning and 22  units with supper  . isosorbide mononitrate (IMDUR) 60 MG 24 hr tablet Take 1.5 tablets (90 mg total) by mouth daily.  Marland Kitchen loperamide (IMODIUM) 2 MG capsule Take 2 mg by mouth as needed for diarrhea or loose stools.   . meclizine (ANTIVERT) 25 MG tablet Take 25 mg by mouth 2 (two) times daily as needed for dizziness.   . metoprolol succinate (TOPROL-XL) 100 MG 24 hr tablet TAKE 1 TABLET BY MOUTH EVERY DAY  . montelukast (SINGULAIR) 5 MG chewable tablet Chew 1 tablet (5 mg total) by mouth at bedtime.  . Multiple Vitamin (MULTIVITAMIN WITH MINERALS) TABS Take 1 tablet by mouth daily.  . Nebulizers (COMPRESSOR/NEBULIZER) MISC Use with albuterol  .  nitroGLYCERIN (NITROSTAT) 0.4 MG SL tablet Place 0.4 mg under the tongue every 5 (five) minutes as needed for chest pain.  . nortriptyline (PAMELOR) 50 MG capsule TAKE 1 CAPSULE (50 MG TOTAL) BY MOUTH AT BEDTIME.  . ONE TOUCH ULTRA TEST test strip CHECK BLOOD SUGAR TWICE A DAY DX: E11.9  .  ONETOUCH DELICA LANCETS 42P MISC 3 (three) times daily. for testing  . pantoprazole (PROTONIX) 40 MG tablet Take 1 tablet (40 mg total) by mouth 2 (two) times daily before a meal.  . Spacer/Aero-Holding Chambers (AEROCHAMBER MV) inhaler Use as instructed  . [DISCONTINUED] benzonatate (TESSALON) 100 MG capsule Take 100 mg by mouth 2 (two) times daily as needed for cough.    Facility-Administered Encounter Medications as of 01/24/2018  Medication  . cyanocobalamin ((VITAMIN B-12)) injection 1,000 mcg    Activities of Daily Living In your present state of health, do you have any difficulty performing the following activities: 01/24/2018  Hearing? N  Vision? N  Difficulty concentrating or making decisions? N  Walking or climbing stairs? N  Dressing or bathing? N  Doing errands, shopping? N  Preparing Food and eating ? N  Using the Toilet? N  In the past six months, have you accidently leaked urine? N  Do you have problems with loss of bowel control? N  Managing your Medications? N  Managing your Finances? N  Housekeeping or managing your Housekeeping? N  Some recent data might be hidden    Patient Care Team: Binnie Rail, MD as PCP - General (Internal Medicine) Elsie Stain, MD as Attending Physician (Pulmonary Disease) Nahser, Wonda Cheng, MD as Consulting Physician (Cardiology) Ladene Artist, MD as Consulting Physician (Gastroenterology) Javier Glazier, MD as Consulting Physician (Pulmonary Disease)    Assessment:   This is a routine wellness examination for [place]. Physical assessment deferred to PCP.   Exercise Activities and Dietary recommendations Current Exercise Habits: Structured exercise class, Type of exercise: walking;stretching(PT for balance), Time (Minutes): 45, Frequency (Times/Week): 3, Weekly Exercise (Minutes/Week): 135, Intensity: Mild, Exercise limited by: orthopedic condition(s)  Diet (meal preparation, eat out, water intake, caffeinated beverages,  dairy products, fruits and vegetables): in general, a "healthy" diet     Reviewed heart healthy and diabetic diet. Encouraged patient to increase daily water and healthy fluid intake.  Goals    . Patient Stated     Take time for myself to rest mind, body and spirit. I will call my pastor friend to ask her to preside over my services for the next 2 weeks.     . Patient Stated     Increase my physical and social activity. Relax and take time just for me.       Fall Risk Fall Risk  01/24/2018 07/20/2017 03/09/2017 01/14/2017 11/23/2016  Falls in the past year? 1 No Yes Yes No  Number falls in past yr: 1 - 2 or more 2 or more -  Injury with Fall? 0 - No No -  Risk Factor Category  - - High Fall Risk High Fall Risk -  Risk for fall due to : Impaired balance/gait;Impaired mobility - Impaired balance/gait Impaired balance/gait -  Follow up - - Falls evaluation completed;Education provided;Falls prevention discussed Falls prevention discussed;Education provided -    Depression Screen PHQ 2/9 Scores 01/24/2018 01/14/2017 11/23/2016 04/22/2015  PHQ - 2 Score 1 2 0 0  PHQ- 9 Score 4 6 - -     Cognitive Function MMSE - Mini Mental State Exam  01/24/2018 01/14/2017  Orientation to time 5 5  Orientation to Place 5 5  Registration 3 3  Attention/ Calculation 5 5  Recall 2 1  Language- name 2 objects 2 2  Language- repeat 1 1  Language- follow 3 step command 3 3  Language- read & follow direction 1 1  Write a sentence 1 1  Copy design 1 1  Total score 29 28        Immunization History  Administered Date(s) Administered  . Influenza Split 12/15/2011, 11/08/2012, 11/19/2014  . Influenza Whole 10/09/2008  . Influenza, High Dose Seasonal PF 11/19/2015, 11/23/2016, 10/26/2017  . Influenza-Unspecified 10/29/2013  . Pneumococcal Conjugate-13 09/03/2015  . Pneumococcal Polysaccharide-23 02/09/2004  . Pneumococcal-Unspecified 02/09/2012   Screening Tests Health Maintenance  Topic Date Due   . TETANUS/TDAP  09/30/1957  . DEXA SCAN  10/01/2003  . URINE MICROALBUMIN  05/19/2017  . FOOT EXAM  01/14/2018  . OPHTHALMOLOGY EXAM  12/28/2018  . INFLUENZA VACCINE  Completed  . PNA vac Low Risk Adult  Completed      Plan:     Engineer, agricultural activity resources provided.   Continue doing brain stimulating activities (puzzles, reading, adult coloring books, staying active) to keep memory sharp.   Continue to eat heart healthy diet (full of fruits, vegetables, whole grains, lean protein, water--limit salt, fat, and sugar intake) and increase physical activity as tolerated.   I have personally reviewed and noted the following in the patient's chart:   . Medical and social history . Use of alcohol, tobacco or illicit drugs  . Current medications and supplements . Functional ability and status . Nutritional status . Physical activity . Advanced directives . List of other physicians . Vitals . Screenings to include cognitive, depression, and falls . Referrals and appointments  In addition, I have reviewed and discussed with patient certain preventive protocols, quality metrics, and best practice recommendations. A written personalized care plan for preventive services as well as general preventive health recommendations were provided to patient.     Michiel Cowboy, RN  01/24/2018    Medical screening examination/treatment/procedure(s) were performed by non-physician practitioner and as supervising physician I was immediately available for consultation/collaboration. I agree with above. Binnie Rail, MD

## 2018-01-24 NOTE — Assessment & Plan Note (Signed)
Following with Dr Chalmers Cater - management per her - she has a f/u appt next month Will let her get blood work done there Continue regular exercise Low sugar/carb diet

## 2018-01-24 NOTE — Assessment & Plan Note (Signed)
Check lipid panel  Continue zetia since intolerant to statins Regular exercise and healthy diet encouraged

## 2018-01-24 NOTE — Patient Instructions (Signed)
Continue doing brain stimulating activities (puzzles, reading, adult coloring books, staying active) to keep memory sharp.   Continue to eat heart healthy diet (full of fruits, vegetables, whole grains, lean protein, water--limit salt, fat, and sugar intake) and increase physical activity as tolerated.   [name] , Thank you for taking time to come for your Medicare Wellness Visit. I appreciate your ongoing commitment to your health goals. Please review the following plan we discussed and let me know if I can assist you in the future.   These are the goals we discussed: Goals    . Patient Stated     Take time for myself to rest mind, body and spirit. I will call my pastor friend to ask her to preside over my services for the next 2 weeks.     . Patient Stated     Increase my physical and social activity. Relax and take time just for me.       This is a list of the screening recommended for you and due dates:  Health Maintenance  Topic Date Due  . Tetanus Vaccine  09/30/1959  . DEXA scan (bone density measurement)  10/01/2003  . Urine Protein Check  05/19/2017  . Complete foot exam   01/14/2018  . Eye exam for diabetics  12/28/2018  . Flu Shot  Completed  . Pneumonia vaccines  Completed   Health Maintenance, Female Adopting a healthy lifestyle and getting preventive care can go a long way to promote health and wellness. Talk with your health care provider about what schedule of regular examinations is right for you. This is a good chance for you to check in with your provider about disease prevention and staying healthy. In between checkups, there are plenty of things you can do on your own. Experts have done a lot of research about which lifestyle changes and preventive measures are most likely to keep you healthy. Ask your health care provider for more information. Weight and diet Eat a healthy diet  Be sure to include plenty of vegetables, fruits, low-fat dairy products, and  lean protein.  Do not eat a lot of foods high in solid fats, added sugars, or salt.  Get regular exercise. This is one of the most important things you can do for your health. ? Most adults should exercise for at least 150 minutes each week. The exercise should increase your heart rate and make you sweat (moderate-intensity exercise). ? Most adults should also do strengthening exercises at least twice a week. This is in addition to the moderate-intensity exercise.  Maintain a healthy weight  Body mass index (BMI) is a measurement that can be used to identify possible weight problems. It estimates body fat based on height and weight. Your health care provider can help determine your BMI and help you achieve or maintain a healthy weight.  For females 28 years of age and older: ? A BMI below 18.5 is considered underweight. ? A BMI of 18.5 to 24.9 is normal. ? A BMI of 25 to 29.9 is considered overweight. ? A BMI of 30 and above is considered obese.  Watch levels of cholesterol and blood lipids  You should start having your blood tested for lipids and cholesterol at 79 years of age, then have this test every 5 years.  You may need to have your cholesterol levels checked more often if: ? Your lipid or cholesterol levels are high. ? You are older than 79 years of age. ? You  are at high risk for heart disease.  Cancer screening Lung Cancer  Lung cancer screening is recommended for adults 74-58 years old who are at high risk for lung cancer because of a history of smoking.  A yearly low-dose CT scan of the lungs is recommended for people who: ? Currently smoke. ? Have quit within the past 15 years. ? Have at least a 30-pack-year history of smoking. A pack year is smoking an average of one pack of cigarettes a day for 1 year.  Yearly screening should continue until it has been 15 years since you quit.  Yearly screening should stop if you develop a health problem that would prevent you  from having lung cancer treatment.  Breast Cancer  Practice breast self-awareness. This means understanding how your breasts normally appear and feel.  It also means doing regular breast self-exams. Let your health care provider know about any changes, no matter how small.  If you are in your 20s or 30s, you should have a clinical breast exam (CBE) by a health care provider every 1-3 years as part of a regular health exam.  If you are 28 or older, have a CBE every year. Also consider having a breast X-ray (mammogram) every year.  If you have a family history of breast cancer, talk to your health care provider about genetic screening.  If you are at high risk for breast cancer, talk to your health care provider about having an MRI and a mammogram every year.  Breast cancer gene (BRCA) assessment is recommended for women who have family members with BRCA-related cancers. BRCA-related cancers include: ? Breast. ? Ovarian. ? Tubal. ? Peritoneal cancers.  Results of the assessment will determine the need for genetic counseling and BRCA1 and BRCA2 testing.  Cervical Cancer Your health care provider may recommend that you be screened regularly for cancer of the pelvic organs (ovaries, uterus, and vagina). This screening involves a pelvic examination, including checking for microscopic changes to the surface of your cervix (Pap test). You may be encouraged to have this screening done every 3 years, beginning at age 13.  For women ages 68-65, health care providers may recommend pelvic exams and Pap testing every 3 years, or they may recommend the Pap and pelvic exam, combined with testing for human papilloma virus (HPV), every 5 years. Some types of HPV increase your risk of cervical cancer. Testing for HPV may also be done on women of any age with unclear Pap test results.  Other health care providers may not recommend any screening for nonpregnant women who are considered low risk for pelvic  cancer and who do not have symptoms. Ask your health care provider if a screening pelvic exam is right for you.  If you have had past treatment for cervical cancer or a condition that could lead to cancer, you need Pap tests and screening for cancer for at least 20 years after your treatment. If Pap tests have been discontinued, your risk factors (such as having a new sexual partner) need to be reassessed to determine if screening should resume. Some women have medical problems that increase the chance of getting cervical cancer. In these cases, your health care provider may recommend more frequent screening and Pap tests.  Colorectal Cancer  This type of cancer can be detected and often prevented.  Routine colorectal cancer screening usually begins at 79 years of age and continues through 79 years of age.  Your health care provider may recommend screening at  an earlier age if you have risk factors for colon cancer.  Your health care provider may also recommend using home test kits to check for hidden blood in the stool.  A small camera at the end of a tube can be used to examine your colon directly (sigmoidoscopy or colonoscopy). This is done to check for the earliest forms of colorectal cancer.  Routine screening usually begins at age 50.  Direct examination of the colon should be repeated every 5-10 years through 79 years of age. However, you may need to be screened more often if early forms of precancerous polyps or small growths are found.  Skin Cancer  Check your skin from head to toe regularly.  Tell your health care provider about any new moles or changes in moles, especially if there is a change in a mole's shape or color.  Also tell your health care provider if you have a mole that is larger than the size of a pencil eraser.  Always use sunscreen. Apply sunscreen liberally and repeatedly throughout the day.  Protect yourself by wearing long sleeves, pants, a wide-brimmed hat,  and sunglasses whenever you are outside.  Heart disease, diabetes, and high blood pressure  High blood pressure causes heart disease and increases the risk of stroke. High blood pressure is more likely to develop in: ? People who have blood pressure in the high end of the normal range (130-139/85-89 mm Hg). ? People who are overweight or obese. ? People who are African American.  If you are 29-71 years of age, have your blood pressure checked every 3-5 years. If you are 63 years of age or older, have your blood pressure checked every year. You should have your blood pressure measured twice-once when you are at a hospital or clinic, and once when you are not at a hospital or clinic. Record the average of the two measurements. To check your blood pressure when you are not at a hospital or clinic, you can use: ? An automated blood pressure machine at a pharmacy. ? A home blood pressure monitor.  If you are between 9 years and 79 years old, ask your health care provider if you should take aspirin to prevent strokes.  Have regular diabetes screenings. This involves taking a blood sample to check your fasting blood sugar level. ? If you are at a normal weight and have a low risk for diabetes, have this test once every three years after 79 years of age. ? If you are overweight and have a high risk for diabetes, consider being tested at a younger age or more often. Preventing infection Hepatitis B  If you have a higher risk for hepatitis B, you should be screened for this virus. You are considered at high risk for hepatitis B if: ? You were born in a country where hepatitis B is common. Ask your health care provider which countries are considered high risk. ? Your parents were born in a high-risk country, and you have not been immunized against hepatitis B (hepatitis B vaccine). ? You have HIV or AIDS. ? You use needles to inject street drugs. ? You live with someone who has hepatitis B. ? You  have had sex with someone who has hepatitis B. ? You get hemodialysis treatment. ? You take certain medicines for conditions, including cancer, organ transplantation, and autoimmune conditions.  Hepatitis C  Blood testing is recommended for: ? Everyone born from [birth year] through 1967. ? Anyone with known risk factors for  hepatitis C.  Sexually transmitted infections (STIs)  You should be screened for sexually transmitted infections (STIs) including gonorrhea and chlamydia if: ? You are sexually active and are younger than 79 years of age. ? You are older than 79 years of age and your health care provider tells you that you are at risk for this type of infection. ? Your sexual activity has changed since you were last screened and you are at an increased risk for chlamydia or gonorrhea. Ask your health care provider if you are at risk.  If you do not have HIV, but are at risk, it may be recommended that you take a prescription medicine daily to prevent HIV infection. This is called pre-exposure prophylaxis (PrEP). You are considered at risk if: ? You are sexually active and do not regularly use condoms or know the HIV status of your partner(s). ? You take drugs by injection. ? You are sexually active with a partner who has HIV.  Talk with your health care provider about whether you are at high risk of being infected with HIV. If you choose to begin PrEP, you should first be tested for HIV. You should then be tested every 3 months for as long as you are taking PrEP. Pregnancy  If you are premenopausal and you may become pregnant, ask your health care provider about preconception counseling.  If you may become pregnant, take 400 to 800 micrograms (mcg) of folic acid every day.  If you want to prevent pregnancy, talk to your health care provider about birth control (contraception). Osteoporosis and menopause  Osteoporosis is a disease in which the bones lose minerals and strength with aging.  This can result in serious bone fractures. Your risk for osteoporosis can be identified using a bone density scan.  If you are 41 years of age or older, or if you are at risk for osteoporosis and fractures, ask your health care provider if you should be screened.  Ask your health care provider whether you should take a calcium or vitamin D supplement to lower your risk for osteoporosis.  Menopause may have certain physical symptoms and risks.  Hormone replacement therapy may reduce some of these symptoms and risks. Talk to your health care provider about whether hormone replacement therapy is right for you. Follow these instructions at home:  Schedule regular health, dental, and eye exams.  Stay current with your immunizations.  Do not use any tobacco products including cigarettes, chewing tobacco, or electronic cigarettes.  If you are pregnant, do not drink alcohol.  If you are breastfeeding, limit how much and how often you drink alcohol.  Limit alcohol intake to no more than 1 drink per day for nonpregnant women. One drink equals 12 ounces of beer, 5 ounces of [name], or 1 ounces of hard liquor.  Do not use street drugs.  Do not share needles.  Ask your health care provider for help if you need support or information about quitting drugs.  Tell your health care provider if you often feel depressed.  Tell your health care provider if you have ever been abused or do not feel safe at home. This information is not intended to replace advice given to you by your health care provider. Make sure you discuss any questions you have with your health care provider. Document Released: 08/10/2010 Document Revised: 07/03/2015 Document Reviewed: 10/29/2014 Elsevier Interactive Patient Education  [employer].

## 2018-01-24 NOTE — Assessment & Plan Note (Signed)
Taking nortriptyline Only gets about 4 hrs at night Will discuss other options at future visits - needs to be able to afford current medications first

## 2018-01-24 NOTE — Assessment & Plan Note (Signed)
GERD controlled Continue daily medication - needs both the pantoprazole and pepcid

## 2018-01-24 NOTE — Progress Notes (Signed)
Subjective:    Patient ID: [name], female    DOB: 06/23/1940, 77 y.o.   MRN: [id number]  HPI The patient is here for follow up.  Difficulty affording medications - she can not afford her albuterol, pepcid and inhalers.  Her insulin is also very expensive.    Hypertension: She is taking her medication daily. She is compliant with a low sodium diet.  She denies chest pain, palpitations, edema, shortness of breath and regular headaches. She is exercising regularly.    Diabetes: she is following with Dr Chalmers Cater.  She is taking her medication daily as prescribed. She is compliant with a diabetic diet. She is exercising regularly. Sugars have been all over the place.   She is up-to-date with an ophthalmology examination.   Hyperlipidemia: She is taking her medication daily. She is compliant with a low fat/cholesterol diet. She is exercising regularly.    GERD:  She is taking her medication daily as prescribed.  She denies any GERD symptoms and feels her GERD is well controlled.   Insomnia:  She is taking nortriptyline.  She gets 4 hrs of sleep at night only.   B12 def:  She gets B12 injections monthly, but does not always come every month.    Medications and allergies reviewed with patient and updated if appropriate.  Patient Active Problem List   Diagnosis Date Noted  . Arthralgia 08/01/2017  . COPD with asthma ([place]) 05/04/2017  . Angina pectoris ([place]) 01/05/2017  . Insomnia 11/23/2016  . Chronic nonintractable headache 11/23/2016  . Pancreatic insufficiency 10/28/2016  . Carotid artery disease ([place]) 05/19/2016  . Fatigue 05/19/2016  . Anemia 03/27/2016  . Type 1 diabetes mellitus ([place]) 07/21/2015  . B12 deficiency 03/31/2015  . OSA (obstructive sleep apnea) 11/20/2014  . Fecal incontinence 08/28/2014  . 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 Falls 08/28/2014  . Neck pain of over 3 months duration 08/28/2014  . H/O total knee replacement 02/20/2014  . Obesity (BMI  30-39.9) 11/12/2013  . Chronic diastolic CHF (congestive heart failure) ([place]) 11/17/2012  . Meniscus, lateral, anterior horn derangement 11/10/2011  . Medial meniscus, posterior horn derangement 11/10/2011  . Osteoarthritis of right knee 11/10/2011  . Vertigo 10/05/2011  . HTN (hypertension), benign 10/05/2011  . Coronary artery disease 08/27/2010  . MUSCLE CRAMPS 12/29/2009  . History of myocardial infarction 11/19/2009  . Extrinsic asthma 11/19/2009  . GERD 11/19/2009  . Cough 11/19/2009  . Hyperlipidemia 11/18/2009    Current Outpatient Medications on File Prior to Visit  Medication Sig Dispense Refill  . albuterol (PROVENTIL HFA;VENTOLIN HFA) 108 (90 BASE) MCG/ACT inhaler Inhale 2 puffs into the lungs every 6 (six) hours as needed. For shortness of breath. 1 Inhaler 0  . albuterol (PROVENTIL) (2.5 MG/3ML) 0.083% nebulizer solution Take 3 mLs (2.5 mg total) by nebulization every 6 (six) hours as needed for wheezing or shortness of breath. 75 mL 12  . alum & mag hydroxide-simeth (MAALOX/MYLANTA) 200-200-20 MG/5ML suspension Take 30 mLs as needed by mouth for indigestion or heartburn.    Marland Kitchen aspirin EC 81 MG tablet Take 1 tablet (81 mg total) by mouth daily.    . baclofen (LIORESAL) 20 MG tablet Take 1 tablet (20 mg total) by mouth 3 (three) times daily. 90 tablet 5  . Blood Glucose Monitoring Suppl (ONE TOUCH ULTRA MINI) w/Device KIT 3 (three) times daily. for testing  0  . budesonide-formoterol (SYMBICORT) 160-4.5 MCG/ACT inhaler Inhale 2 puffs into the lungs 2 (two) times  daily.    . calcium carbonate (TUMS - DOSED IN MG ELEMENTAL CALCIUM) 500 MG chewable tablet Chew 2 tablets daily as needed by mouth for indigestion or heartburn.    . Cholecalciferol (VITAMIN D) 2000 UNITS tablet Take 2,000 Units by mouth daily.     . clonazePAM (KLONOPIN) 0.5 MG tablet Take 0.5 mg daily by mouth.    . CREON 24000-76000 units CPEP TAKE 2 CAPSULES BY MOUTH 3 TIMES A DAY WITH MEALS (TAK 1 CAPSULE WITH A  SNACK) 210 capsule 1  . dapagliflozin propanediol (FARXIGA) 5 MG TABS tablet Take 5 mg by mouth daily. 30 tablet 5  . diclofenac sodium (VOLTAREN) 1 % GEL Apply 2 g topically 4 (four) times daily as needed (muscle pain).    Marland Kitchen diltiazem (CARDIZEM CD) 240 MG 24 hr capsule TAKE 1 CAPSULE BY MOUTH EVERY DAY 90 capsule 2  . ezetimibe (ZETIA) 10 MG tablet Take 10 mg by mouth daily.  6  . famotidine (PEPCID) 40 MG tablet Take 1 tablet (40 mg total) by mouth daily as needed. 90 tablet 0  . fexofenadine (ALLEGRA) 180 MG tablet Take 1 tablet (180 mg total) by mouth daily. (Patient taking differently: Take 180 mg by mouth daily as needed. ) 30 tablet 6  . fluticasone (FLONASE) 50 MCG/ACT nasal spray Place 2 sprays into both nostrils daily. 16 g 6  . Fluticasone-Umeclidin-Vilant (TRELEGY ELLIPTA) 100-62.5-25 MCG/INH AEPB Inhale 1 puff into the lungs daily. 1 each 0  . insulin lispro protamine-insulin lispro (HUMALOG 75/25) (75-25) 100 UNIT/ML SUSP Inject 15-22 Units into the skin See admin instructions. Takes 15 units in the morning and 22  units with supper    . isosorbide mononitrate (IMDUR) 60 MG 24 hr tablet Take 1.5 tablets (90 mg total) by mouth daily. 135 tablet 1  . loperamide (IMODIUM) 2 MG capsule Take 2 mg by mouth as needed for diarrhea or loose stools.     . meclizine (ANTIVERT) 25 MG tablet Take 25 mg by mouth 2 (two) times daily as needed for dizziness.     . metoprolol succinate (TOPROL-XL) 100 MG 24 hr tablet TAKE 1 TABLET BY MOUTH EVERY DAY 90 tablet 1  . montelukast (SINGULAIR) 5 MG chewable tablet Chew 1 tablet (5 mg total) by mouth at bedtime. 30 tablet 3  . Multiple Vitamin (MULTIVITAMIN WITH MINERALS) TABS Take 1 tablet by mouth daily.    . Nebulizers (COMPRESSOR/NEBULIZER) MISC Use with albuterol 1 each 0  . nitroGLYCERIN (NITROSTAT) 0.4 MG SL tablet Place 0.4 mg under the tongue every 5 (five) minutes as needed for chest pain.    . nortriptyline (PAMELOR) 50 MG capsule TAKE 1 CAPSULE  (50 MG TOTAL) BY MOUTH AT BEDTIME. 90 capsule 2  . ONE TOUCH ULTRA TEST test strip CHECK BLOOD SUGAR TWICE A DAY DX: E11.9  3  . ONETOUCH DELICA LANCETS 80K MISC 3 (three) times daily. for testing  0  . pantoprazole (PROTONIX) 40 MG tablet Take 1 tablet (40 mg total) by mouth 2 (two) times daily before a meal. 180 tablet 3  . Spacer/Aero-Holding Chambers (AEROCHAMBER MV) inhaler Use as instructed 1 each 0  . furosemide (LASIX) 40 MG tablet Take 1 tablet (40 mg total) by mouth daily. 90 tablet 3   Current Facility-Administered Medications on File Prior to Visit  Medication Dose Route Frequency Provider Last Rate Last Dose  . cyanocobalamin ((VITAMIN B-12)) injection 1,000 mcg  1,000 mcg Intramuscular Q30 days Binnie Rail, MD  1,000 mcg at 03/26/16 1658    Past Medical History:  Diagnosis Date  . Adenomatous colon polyp   . Allergy   . Anxiety   . Asthma   . Chest pain   . Chronic diastolic CHF (congestive heart failure) ([place])   . COPD (chronic obstructive pulmonary disease) ([place])    pt is unsure if has been officially diagnosed  . Coronary artery disease    CABG '09- cathed 12/09, 9/10, 6/11, 3/14 and 12/13/16- medical Rx  . Diabetes mellitus   . GERD (gastroesophageal reflux disease)   . Hiatal hernia   . Hyperlipidemia   . Hypertension   . Myocardial infarction ([place]) 2009  . PONV (postoperative nausea and vomiting)   . Schatzki's ring   . Shoulder injury    resolved after shoulder surgery  . Sleep apnea    not on cpap    Past Surgical History:  Procedure Laterality Date  . ABDOMINAL HYSTERECTOMY    . APPENDECTOMY     came out with Hysterectomy  . CARDIAC CATHETERIZATION  07/23/2009   EF 60%  . CARDIAC CATHETERIZATION  10/11/2008  . CARDIAC CATHETERIZATION  03/01/2007   EF 75-80%  . CARDIOVASCULAR STRESS TEST  11/15/2007   EF 60%  . COLONOSCOPY    . CORONARY ARTERY BYPASS GRAFT     SEVERELY DISEASED SAPHENOUS VEIN GRAFT TO THE RIGHT CORONARY ARTERY BUT WITH FAIRLY  WELL PRESERVED FLOW TO THE DISTAL RIGHT CORONARY ARTERY FROM THE NATIVE CIRCULATION-RESTART  CATH IN JUNE 2000, REVEALS MILD/MODERATE  CAD WITH GOOD FLOW DOWN HER LAD  . ESOPHAGOGASTRODUODENOSCOPY    . EYE SURGERY     bilateral cataract surgery with lens implant  . LEFT HEART CATH AND CORS/GRAFTS ANGIOGRAPHY N/A 12/14/2016   Procedure: LEFT HEART CATH AND CORS/GRAFTS ANGIOGRAPHY;  Surgeon: Jettie Booze, MD;  Location: [facility];  Service: Cardiovascular;  Laterality: N/A;  . lense removal Left   . POLYPECTOMY    . ROTATOR CUFF REPAIR     right and left  . TONSILLECTOMY     age 48  . 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 tumor removed kidney    . UPPER GASTROINTESTINAL ENDOSCOPY    . US ECHOCARDIOGRAPHY  03/08/2008   EF 55-60%    Social History   Socioeconomic History  . Marital status: Widowed    Spouse name: Not on file  . Number of children: 4  . Years of education: Designer, jewellery  . Highest education level: Not on file  Occupational History  . Occupation: Retired  Scientific laboratory technician  . Financial resource strain: Not hard at all  . Food insecurity:    Worry: Never true    Inability: Never true  . Transportation needs:    Medical: No    Non-medical: No  Tobacco Use  . Smoking status: Former Smoker    Packs/day: 0.50    Years: 21.00    Pack years: 10.50    Types: Cigarettes    Start date: 02/09/1955    Last attempt to quit: 02/09/1976    Years since quitting: 41.9  . Smokeless tobacco: Never Used  Substance and Sexual Activity  . Alcohol use: No    Alcohol/week: 0.0 standard drinks  . Drug use: No  . Sexual activity: Never  Lifestyle  . Physical activity:    Days per week:  2 days    Minutes per session: 40 min  . Stress: Very much  Relationships  . Social connections:    Talks on phone: More than three times a week      Gets together: More than three times a week    Attends religious service: More than 4 times per year    Active member of club or organization: Not on file    Attends meetings of clubs or organizations: More than 4 times per year    Relationship status: Widowed  Other Topics Concern  . Not on file  Social History Narrative   Lives alone.  One story home.  Has 4 children.  Education: doctorate in theology.    Caffeine use: Drinks 1 cup coffee/day      Originally from . Previously has lived in [place]. Prior travel to [place], [place], [place], [place], [place], [place], [place], & [place]. Previously worked in [employer] She has a dog currently. No bird, mold, or hot tub exposure. She also pastors a church.     Family History  Problem Relation Age of Onset  . Heart disease Maternal Grandfather   . Heart failure Maternal Grandfather   . Diabetes Maternal Grandfather   . Heart attack Father   . Diabetes Mother   . Rheum arthritis Sister   . Emphysema Paternal Uncle   . Esophageal cancer Brother 20       she said he was born with it  . Emphysema Paternal Aunt   . Neuropathy Neg Hx   . Multiple sclerosis Neg Hx   . Colon cancer Neg Hx   . Colon polyps Neg Hx   . Rectal cancer Neg Hx   . Stomach cancer Neg Hx     Review of Systems  Constitutional: Negative for chills and fever.  Respiratory: Positive for cough, shortness of breath and wheezing (related to weather changes).   Cardiovascular: Positive for chest pain (baseline - cardiology aware) and leg swelling (chronic, stable). Negative for palpitations.  Neurological: Positive for dizziness (with getting fatigued or congested), light-headedness and headaches (daily).       Objective:   Vitals:   01/24/18 1405  BP: (!) 152/88  Resp: 16  Temp: 98.5 F (36.9 C)   BP Readings from Last 3 Encounters:  01/24/18 (!) 152/88  10/26/17 132/80  10/04/17 (!) 152/74   Wt Readings from Last 3 Encounters:  01/24/18 200 lb (90.7  kg)  10/26/17 198 lb 3.2 oz (89.9 kg)  10/04/17 194 lb (88 kg)   Body mass index is 36.58 kg/m.   Physical Exam    Constitutional: Appears well-developed and well-nourished. No distress.  HENT:  Head: Normocephalic and atraumatic.  Neck: Neck supple. No tracheal deviation present. No thyromegaly present.  No cervical lymphadenopathy Cardiovascular: Normal rate, regular rhythm and normal heart sounds.   No murmur heard. No carotid bruit .  No edema Pulmonary/Chest: Effort normal and breath sounds normal. No respiratory distress. No has no wheezes. No rales.  Skin: Skin is warm and dry. Not diaphoretic.  Psychiatric: Normal mood and affect. Behavior is normal.      Assessment & Plan:    See Problem List for Assessment and Plan of chronic medical problems.

## 2018-01-24 NOTE — Assessment & Plan Note (Signed)
BP elevated here today, but has been better controlled Continue current medications at current doses Reviewed recent blood work

## 2018-01-24 NOTE — Telephone Encounter (Signed)
LVM letting pt know we will discuss medication issues during visit today.

## 2018-01-25 ENCOUNTER — Ambulatory Visit: Payer: Medicare Other | Admitting: Physical Therapy

## 2018-01-25 ENCOUNTER — Encounter: Payer: Self-pay | Admitting: Physical Therapy

## 2018-01-25 VITALS — BP 130/78

## 2018-01-25 DIAGNOSIS — R2681 Unsteadiness on feet: Secondary | ICD-10-CM | POA: Diagnosis not present

## 2018-01-25 DIAGNOSIS — R2689 Other abnormalities of gait and mobility: Secondary | ICD-10-CM

## 2018-01-25 DIAGNOSIS — M6281 Muscle weakness (generalized): Secondary | ICD-10-CM

## 2018-01-25 NOTE — Therapy (Signed)
[facility] [address] [place], [place], [address] Phone: [phone]   Fax:  [phone]  Physical Therapy Treatment  Patient Details  Name: [name] MRN: [id number] Date of Birth: August 30, 1940 Referring Provider (PT): Metta Clines, DO   Encounter Date: 01/25/2018  PT End of Session - 01/25/18 1627    Visit Number  13    Number of Visits  20    Date for PT Re-Evaluation  02/17/18    Authorization Type  [employer] Medicare - 10th visit PN    PT Start Time  1538    PT Stop Time  1620    PT Time Calculation (min)  42 min    Activity Tolerance  Patient tolerated treatment well    Behavior During Therapy  [facility] for tasks assessed/performed       Past Medical History:  Diagnosis Date  . Adenomatous colon polyp   . Allergy   . Anxiety   . Asthma   . Chest pain   . Chronic diastolic CHF (congestive heart failure) ([place])   . COPD (chronic obstructive pulmonary disease) ([place])    pt is unsure if has been officially diagnosed  . Coronary artery disease    CABG '09- cathed 12/09, 9/10, 6/11, 3/14 and 12/13/16- medical Rx  . Diabetes mellitus   . GERD (gastroesophageal reflux disease)   . Hiatal hernia   . Hyperlipidemia   . Hypertension   . Myocardial infarction ([place]) 2009  . PONV (postoperative nausea and vomiting)   . Schatzki's ring   . Shoulder injury    resolved after shoulder surgery  . Sleep apnea    not on cpap    Past Surgical History:  Procedure Laterality Date  . ABDOMINAL HYSTERECTOMY    . APPENDECTOMY     came out with Hysterectomy  . CARDIAC CATHETERIZATION  07/23/2009   EF 60%  . CARDIAC CATHETERIZATION  10/11/2008  . CARDIAC CATHETERIZATION  03/01/2007   EF 75-80%  . CARDIOVASCULAR STRESS TEST  11/15/2007   EF 60%  . COLONOSCOPY    . CORONARY ARTERY BYPASS GRAFT     SEVERELY DISEASED SAPHENOUS VEIN GRAFT TO THE RIGHT CORONARY ARTERY BUT WITH FAIRLY WELL PRESERVED FLOW TO THE DISTAL RIGHT CORONARY  ARTERY FROM THE NATIVE CIRCULATION-RESTART  CATH IN JUNE 2000, REVEALS MILD/MODERATE  CAD WITH GOOD FLOW DOWN HER LAD  . ESOPHAGOGASTRODUODENOSCOPY    . EYE SURGERY     bilateral cataract surgery with lens implant  . LEFT HEART CATH AND CORS/GRAFTS ANGIOGRAPHY N/A 12/14/2016   Procedure: LEFT HEART CATH AND CORS/GRAFTS ANGIOGRAPHY;  Surgeon: Jettie Booze, MD;  Location: [facility];  Service: Cardiovascular;  Laterality: N/A;  . lense removal Left   . POLYPECTOMY    . ROTATOR CUFF REPAIR     right and left  . TONSILLECTOMY     age 22  . 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 tumor removed kidney    . UPPER GASTROINTESTINAL ENDOSCOPY    . US ECHOCARDIOGRAPHY  03/08/2008   EF 55-60%    Vitals:   01/25/18 1553  BP: 130/78    Subjective Assessment - 01/25/18 1541    Subjective  After previous session pt had to sit down for 1 hour but then was able to get up and make 3 pies.  Had  2 spells of dizziness/feeling off balance - once at night before bed and once in the kitchen.    Pertinent History  CABG x 3, OSA, history of smoking (quit 30 y/ago), asthma, diabetic polyneuropathy, vertigo, DM, COPD, HTN, CHF, cervicogenic headaches    Patient Stated Goals  "Hopefully, be able to improve balance."    Currently in Pain?  No/denies                            Balance Exercises - 01/25/18 1553      Balance Exercises: Standing   Balance Beam  blue beam: 3-4 laps each side stepping to L and R without UE support.  Tandem gait with 2 > 1 finger support down and back x 3 laps with supervision    Partial Tandem Stance  Eyes open;Eyes closed;10 secs;Other reps (comment)   R/L foot forwards, facing up/down ramp, head turns/nods x 10       PT Education - 01/25/18 1626    Education Details  plan for final 4 visits     Person(s) Educated  Patient    Methods  Explanation    Comprehension  Verbalized understanding       PT Short Term Goals - 01/18/18 1639      PT SHORT TERM GOAL #1   Title  = LTG        PT Long Term Goals - 01/18/18 1639      PT LONG TERM GOAL #1   Title  patient to be independent with advanced HEP for LE strength and balance    Time  4    Period  Weeks    Status  New    Target Date  02/17/18      PT LONG TERM GOAL #2   Title  Pt will report being able to ambulate >3 laps around track at [facility] 2-3x/week with minimal symptoms    Baseline  3x/week at [facility]    Time  4    Period  Weeks    Status  Revised    Target Date  02/17/18      PT LONG TERM GOAL #3   Title  patient to improve FGA to >/=24/30 demonstrating reduced fall risk    Baseline  21/30    Time  4    Period  Weeks    Status  Revised    Target Date  02/17/18      PT LONG TERM GOAL #5   Title  patient to improve SOT composite score to within age related norms    Baseline  48    Time  4    Period  Weeks    Status  Revised    Target Date  02/17/18            Plan - 01/25/18 1627    Clinical Impression Statement  Continued to focus on balance with narrow BOS on a variety of uneven and compliant surfaces.  Pt requiring decreased UE support and assistance from therapist during tandem gait and with eyes closed.  Will continue to progress towards LTG.      Rehab Potential  Good    PT Frequency  2x / week    PT Duration  4 weeks    PT Treatment/Interventions  ADLs/Self Care Home Management;Canalith Repostioning;Therapeutic exercise;Therapeutic activities;Functional mobility training;Stair training;Gait training;Balance training;Neuromuscular re-education;Patient/family education;Manual techniques;Vestibular;Passive range of motion;Visual/perceptual remediation/compensation    PT Next Visit Plan  continue higher  level balance training focusing on eyes closed, compliant/unstable surfaces.  Balance reactions  especially use of hip strategy.  balance while stepping over objects/cone taps    PT Home Exercise Plan  Access Code: [email or web address]    Consulted and Agree with Plan of Care  Patient       Patient will benefit from skilled therapeutic intervention in order to improve the following deficits and impairments:  Decreased balance, Dizziness, Abnormal gait, Decreased strength, Difficulty walking, Decreased activity tolerance  Visit Diagnosis: Unsteadiness on feet  Muscle weakness (generalized)  Other abnormalities of gait and mobility     Problem List Patient Active Problem List   Diagnosis Date Noted  . Arthralgia 08/01/2017  . COPD with asthma ([place]) 05/04/2017  . Angina pectoris ([place]) 01/05/2017  . Insomnia 11/23/2016  . Chronic nonintractable headache 11/23/2016  . Pancreatic insufficiency 10/28/2016  . Carotid artery disease ([place]) 05/19/2016  . Fatigue 05/19/2016  . Anemia 03/27/2016  . Type 1 diabetes mellitus ([place]) 07/21/2015  . B12 deficiency 03/31/2015  . OSA (obstructive sleep apnea) 11/20/2014  . Fecal incontinence 08/28/2014  . 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 Falls 08/28/2014  . Neck pain of over 3 months duration 08/28/2014  . H/O total knee replacement 02/20/2014  . Obesity (BMI 30-39.9) 11/12/2013  . Chronic diastolic CHF (congestive heart failure) ([place]) 11/17/2012  . Meniscus, lateral, anterior horn derangement 11/10/2011  . Medial meniscus, posterior horn derangement 11/10/2011  . Osteoarthritis of right knee 11/10/2011  . Vertigo 10/05/2011  . HTN (hypertension), benign 10/05/2011  . Coronary artery disease 08/27/2010  . MUSCLE CRAMPS 12/29/2009  . History of myocardial infarction 11/19/2009  . Extrinsic asthma 11/19/2009  . GERD 11/19/2009  . Cough 11/19/2009  . Hyperlipidemia 11/18/2009    Rico Junker, PT, DPT 01/25/18    4:36 PM    [facility] [address] [place],  [place], [address] Phone: [phone]   Fax:  [phone]  Name: [name] MRN: [id number] Date of Birth: [birth year]

## 2018-01-26 ENCOUNTER — Other Ambulatory Visit: Payer: Self-pay | Admitting: Internal Medicine

## 2018-01-26 LAB — CULTURE, URINE COMPREHENSIVE
MICRO NUMBER: 91508594
SPECIMEN QUALITY:: ADEQUATE

## 2018-01-26 MED ORDER — AMOXICILLIN-POT CLAVULANATE 875-125 MG PO TABS: 1.0000 | ORAL_TABLET | Freq: Two times a day (BID) | ORAL | 0 refills | Status: DC

## 2018-01-27 ENCOUNTER — Ambulatory Visit: Payer: Medicare Other

## 2018-01-27 DIAGNOSIS — M6281 Muscle weakness (generalized): Secondary | ICD-10-CM

## 2018-01-27 DIAGNOSIS — R2681 Unsteadiness on feet: Secondary | ICD-10-CM

## 2018-01-27 DIAGNOSIS — R2689 Other abnormalities of gait and mobility: Secondary | ICD-10-CM

## 2018-01-28 NOTE — Therapy (Signed)
[facility] [address] [place], [place], [address] Phone: [phone]   Fax:  [phone]  Physical Therapy Evaluation  Patient Details  Name: [name] MRN: [id number] Date of Birth: [birth year] Referring Provider (PT): Metta Clines, DO   Encounter Date: 01/27/2018  PT End of Session - 01/27/18 1543    Visit Number  14    Number of Visits  20    Date for PT Re-Evaluation  02/17/18    Authorization Type  [employer] Medicare - 10th visit PN    PT Start Time  1535    PT Stop Time  1615    PT Time Calculation (min)  40 min    Equipment Utilized During Treatment  Gait belt    Activity Tolerance  Patient tolerated treatment well    Behavior During Therapy  [facility] for tasks assessed/performed       Past Medical History:  Diagnosis Date  . Adenomatous colon polyp   . Allergy   . Anxiety   . Asthma   . Chest pain   . Chronic diastolic CHF (congestive heart failure) ([place])   . COPD (chronic obstructive pulmonary disease) ([place])    pt is unsure if has been officially diagnosed  . Coronary artery disease    CABG '09- cathed 12/09, 9/10, 6/11, 3/14 and 12/13/16- medical Rx  . Diabetes mellitus   . GERD (gastroesophageal reflux disease)   . Hiatal hernia   . Hyperlipidemia   . Hypertension   . Myocardial infarction ([place]) 2009  . PONV (postoperative nausea and vomiting)   . Schatzki's ring   . Shoulder injury    resolved after shoulder surgery  . Sleep apnea    not on cpap    Past Surgical History:  Procedure Laterality Date  . ABDOMINAL HYSTERECTOMY    . APPENDECTOMY     came out with Hysterectomy  . CARDIAC CATHETERIZATION  07/23/2009   EF 60%  . CARDIAC CATHETERIZATION  10/11/2008  . CARDIAC CATHETERIZATION  03/01/2007   EF 75-80%  . CARDIOVASCULAR STRESS TEST  11/15/2007   EF 60%  . COLONOSCOPY    . CORONARY ARTERY BYPASS GRAFT     SEVERELY DISEASED SAPHENOUS VEIN GRAFT TO THE RIGHT CORONARY ARTERY BUT WITH  FAIRLY WELL PRESERVED FLOW TO THE DISTAL RIGHT CORONARY ARTERY FROM THE NATIVE CIRCULATION-RESTART  CATH IN JUNE 2000, REVEALS MILD/MODERATE  CAD WITH GOOD FLOW DOWN HER LAD  . ESOPHAGOGASTRODUODENOSCOPY    . EYE SURGERY     bilateral cataract surgery with lens implant  . LEFT HEART CATH AND CORS/GRAFTS ANGIOGRAPHY N/[name] 12/14/2016   Procedure: LEFT HEART CATH AND CORS/GRAFTS ANGIOGRAPHY;  Surgeon: Jettie Booze, MD;  Location: [facility];  Service: Cardiovascular;  Laterality: N/[name];  . lense removal Left   . POLYPECTOMY    . ROTATOR CUFF REPAIR     right and left  . TONSILLECTOMY     age 77  . 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 tumor removed kidney    . UPPER GASTROINTESTINAL ENDOSCOPY    . US ECHOCARDIOGRAPHY  03/08/2008   EF 55-60%    There were no vitals filed for this visit.   Subjective Assessment - 01/27/18 1542    Subjective  No falls to report, HEP is going well.     Pertinent History  CABG x 3, OSA, history of smoking (quit 30 y/ago), asthma, diabetic polyneuropathy, vertigo, DM, COPD, HTN, CHF, cervicogenic headaches    Patient Stated Goals  "Hopefully, be able to improve balance."    Currently in Pain?  No/denies         Objective measurements completed on examination: See above findings.      [facility] Adult PT Treatment/Exercise - 01/28/18 0001      Ambulation/Gait   Ambulation/Gait  Yes    Ambulation/Gait Assistance  6: Modified independent (Device/Increase time)    Ambulation Distance (Feet)  115 Feet    Assistive device  None    Gait Pattern  Step-through pattern;Decreased stride length;Trunk flexed;Narrow base of support    Ambulation Surface  Level;Indoor    Curb  5: Supervision    Curb Details (indicate cue type and reason)  Pt previously had fall on curb and gets relunctant when negotiating curbs. Pt  required supervision with VC's for foot clearance and to be confident in abilities when nogotiating curbs.       High Level Balance   High Level Balance Activities  Side stepping;Backward walking;Tandem walking;Negotiating over obstacles    High Level Balance Comments  In parallel bars on blue mat performing fwds/side step over orange hurdles, toe tapping to cones; on blue foam beam performing fwds/bkwds tandem, heel/center/toe taps. Min guard to supervision with each task progressing from SUE to no UE support.          PT Short Term Goals - 01/18/18 1639      PT SHORT TERM GOAL #1   Title  = LTG        PT Long Term Goals - 01/18/18 1639      PT LONG TERM GOAL #1   Title  patient to be independent with advanced HEP for LE strength and balance    Time  4    Period  Weeks    Status  New    Target Date  02/17/18      PT LONG TERM GOAL #2   Title  Pt will report being able to ambulate >3 laps around track at [facility] 2-3x/week with minimal symptoms    Baseline  3x/week at [facility]    Time  4    Period  Weeks    Status  Revised    Target Date  02/17/18      PT LONG TERM GOAL #3   Title  patient to improve FGA to >/=24/30 demonstrating reduced fall risk    Baseline  21/30    Time  4    Period  Weeks    Status  Revised    Target Date  02/17/18      PT LONG TERM GOAL #5   Title  patient to improve SOT composite score to within age related norms    Baseline  48    Time  4    Period  Weeks    Status  Revised    Target Date  02/17/18             Plan - 01/28/18 1127    Clinical Impression Statement  Todays skilled session focused on gait training including negotiating curbs, and high level balance on compliant surfaces with decrease in UE support. Pt is making progress and should benefit from continued PT sessions to progress towards unmet goals.     Rehab Potential  Good    PT Frequency  2x / week    PT Duration  4 weeks    PT Treatment/Interventions  ADLs/Self Care Home  Management;Canalith Repostioning;Therapeutic exercise;Therapeutic activities;Functional mobility training;Stair training;Gait training;Balance training;Neuromuscular re-education;Patient/family education;Manual techniques;Vestibular;Passive range of motion;Visual/perceptual remediation/compensation    PT Next Visit Plan  continue higher level balance training focusing on eyes closed, compliant/unstable surfaces.  Balance reactions especially use of hip strategy.  balance while stepping over objects/cone taps    PT Home Exercise Plan  Access Code: [email or web address]    Consulted and Agree with Plan of Care  Patient       Patient will benefit from skilled therapeutic intervention in order to improve the following deficits and impairments:  Decreased balance, Dizziness, Abnormal gait, Decreased strength, Difficulty walking, Decreased activity tolerance  Visit Diagnosis: Unsteadiness on feet  Other abnormalities of gait and mobility  Muscle weakness (generalized)     Problem List Patient Active Problem List   Diagnosis Date Noted  . Arthralgia 08/01/2017  . COPD with asthma ([place]) 05/04/2017  . Angina pectoris ([place]) 01/05/2017  . Insomnia 11/23/2016  . Chronic nonintractable headache 11/23/2016  . Pancreatic insufficiency 10/28/2016  . Carotid artery disease ([place]) 05/19/2016  . Fatigue 05/19/2016  . Anemia 03/27/2016  . Type 1 diabetes mellitus ([place]) 07/21/2015  . B12 deficiency 03/31/2015  . OSA (obstructive sleep apnea) 11/20/2014  . Fecal incontinence 08/28/2014  . 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 Falls 08/28/2014  . Neck pain of over 3 months duration 08/28/2014  . H/O total knee replacement 02/20/2014  . Obesity (BMI 30-39.9) 11/12/2013  . Chronic diastolic CHF (congestive heart failure) ([place]) 11/17/2012  . Meniscus, lateral, anterior horn derangement 11/10/2011  . Medial meniscus, posterior horn derangement 11/10/2011  . Osteoarthritis of right knee 11/10/2011  . Vertigo  10/05/2011  . HTN (hypertension), benign 10/05/2011  . Coronary artery disease 08/27/2010  . MUSCLE CRAMPS 12/29/2009  . History of myocardial infarction 11/19/2009  . Extrinsic asthma 11/19/2009  . GERD 11/19/2009  . Cough 11/19/2009  . Hyperlipidemia 11/18/2009   [name], PTA  [name] [name] 01/28/2018, 11:33 AM  [facility] [address] [place], [place], [address] Phone: [phone]   Fax:  [phone]  Name: [name] MRN: [id number] Date of Birth: 12-16-1940

## 2018-02-07 ENCOUNTER — Ambulatory Visit: Payer: Medicare Other

## 2018-02-07 DIAGNOSIS — M6281 Muscle weakness (generalized): Secondary | ICD-10-CM

## 2018-02-07 DIAGNOSIS — R2681 Unsteadiness on feet: Secondary | ICD-10-CM

## 2018-02-07 DIAGNOSIS — R2689 Other abnormalities of gait and mobility: Secondary | ICD-10-CM

## 2018-02-07 NOTE — Therapy (Signed)
[facility] [address] [place], [place], [address] Phone: [phone]   Fax:  [phone]  Physical Therapy Treatment  Patient Details  Name: [name] MRN: [id number] Date of Birth: 16-Aug-1940 Referring Provider (PT): Metta Clines, DO   Encounter Date: 02/07/2018  PT End of Session - 02/07/18 1326    Visit Number  15    Number of Visits  20    Date for PT Re-Evaluation  02/17/18    Authorization Type  [employer] Medicare - 10th visit PN    PT Start Time  1315    PT Stop Time  1400    PT Time Calculation (min)  45 min    Equipment Utilized During Treatment  Gait belt    Activity Tolerance  Patient tolerated treatment well    Behavior During Therapy  [facility] for tasks assessed/performed       Past Medical History:  Diagnosis Date  . Adenomatous colon polyp   . Allergy   . Anxiety   . Asthma   . Chest pain   . Chronic diastolic CHF (congestive heart failure) ([place])   . COPD (chronic obstructive pulmonary disease) ([place])    pt is unsure if has been officially diagnosed  . Coronary artery disease    CABG '09- cathed 12/09, 9/10, 6/11, 3/14 and 12/13/16- medical Rx  . Diabetes mellitus   . GERD (gastroesophageal reflux disease)   . Hiatal hernia   . Hyperlipidemia   . Hypertension   . Myocardial infarction ([place]) 2009  . PONV (postoperative nausea and vomiting)   . Schatzki's ring   . Shoulder injury    resolved after shoulder surgery  . Sleep apnea    not on cpap    Past Surgical History:  Procedure Laterality Date  . ABDOMINAL HYSTERECTOMY    . APPENDECTOMY     came out with Hysterectomy  . CARDIAC CATHETERIZATION  07/23/2009   EF 60%  . CARDIAC CATHETERIZATION  10/11/2008  . CARDIAC CATHETERIZATION  03/01/2007   EF 75-80%  . CARDIOVASCULAR STRESS TEST  11/15/2007   EF 60%  . COLONOSCOPY    . CORONARY ARTERY BYPASS GRAFT     SEVERELY DISEASED SAPHENOUS VEIN GRAFT TO THE RIGHT CORONARY ARTERY BUT WITH  FAIRLY WELL PRESERVED FLOW TO THE DISTAL RIGHT CORONARY ARTERY FROM THE NATIVE CIRCULATION-RESTART  CATH IN JUNE 2000, REVEALS MILD/MODERATE  CAD WITH GOOD FLOW DOWN HER LAD  . ESOPHAGOGASTRODUODENOSCOPY    . EYE SURGERY     bilateral cataract surgery with lens implant  . LEFT HEART CATH AND CORS/GRAFTS ANGIOGRAPHY N/A 12/14/2016   Procedure: LEFT HEART CATH AND CORS/GRAFTS ANGIOGRAPHY;  Surgeon: Jettie Booze, MD;  Location: [facility];  Service: Cardiovascular;  Laterality: N/A;  . lense removal Left   . POLYPECTOMY    . ROTATOR CUFF REPAIR     right and left  . TONSILLECTOMY     age 77  . 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 tumor removed kidney    . UPPER GASTROINTESTINAL ENDOSCOPY    . US ECHOCARDIOGRAPHY  03/08/2008   EF 55-60%    There were no vitals filed for this visit.  Subjective Assessment - 02/07/18 1320    Subjective  No falls to report, Pt reports that on saturday & sunday she had trouble performing balance  HEP, "even with standing exercises i lost my balance". Pt had most trouble with tandem. Pt reports she did a lot of cooking over the weekend and may have been fatigued when performing HEP.     Pertinent History  CABG x 3, OSA, history of smoking (quit 30 y/ago), asthma, diabetic polyneuropathy, vertigo, DM, COPD, HTN, CHF, cervicogenic headaches    Patient Stated Goals  "Hopefully, be able to improve balance."    Currently in Pain?  No/denies        [facility] Adult PT Treatment/Exercise - 02/07/18 1342      Ambulation/Gait   Ambulation/Gait  Yes    Ambulation/Gait Assistance  5: Supervision    Ambulation/Gait Assistance Details  Pt required short standing rest break at 500' due to being short of breath. Pt states that she was once on oxygen but it was reported to her that she no longer needed it but feels that she  still does do to wheezing after increased distance while ambulating.     Ambulation Distance (Feet)  1000 Feet    Assistive device  None    Gait Pattern  Step-through pattern;Decreased stride length;Trunk flexed;Narrow base of support    Ambulation Surface  Level;Unlevel;Indoor;Outdoor;Paved      Neuro Re-ed    Neuro Re-ed Details   Blue foam beam in parallel bars performing heel/center/toe taps and toe taps to various colored circles placed in all directions. Min/mod instability while working to decrease UE support required to maintain stability throughout task.         PT Short Term Goals - 01/18/18 1639      PT SHORT TERM GOAL #1   Title  = LTG        PT Long Term Goals - 01/18/18 1639      PT LONG TERM GOAL #1   Title  patient to be independent with advanced HEP for LE strength and balance    Time  4    Period  Weeks    Status  New    Target Date  02/17/18      PT LONG TERM GOAL #2   Title  Pt will report being able to ambulate >3 laps around track at [facility] 2-3x/week with minimal symptoms    Baseline  3x/week at [facility]    Time  4    Period  Weeks    Status  Revised    Target Date  02/17/18      PT LONG TERM GOAL #3   Title  patient to improve FGA to >/=24/30 demonstrating reduced fall risk    Baseline  21/30    Time  4    Period  Weeks    Status  Revised    Target Date  02/17/18      PT LONG TERM GOAL #5   Title  patient to improve SOT composite score to within age related norms    Baseline  48    Time  4    Period  Weeks    Status  Revised    Target Date  02/17/18        Plan - 02/07/18 1520    Clinical Impression Statement  Todays skilled session focused on gait training outdoor for 1000' with one short standing rest break required for pt to catch her breath, and high level balance on compliant/non compliant surfaces with mod/min instability while decreasing UE support required. Pt should benefit from continued PT sessions to progress towards unmet goals.  Rehab Potential  Good    PT Frequency  2x / week    PT Duration  4 weeks    PT Treatment/Interventions  ADLs/Self Care Home Management;Canalith Repostioning;Therapeutic exercise;Therapeutic activities;Functional mobility training;Stair training;Gait training;Balance training;Neuromuscular re-education;Patient/family education;Manual techniques;Vestibular;Passive range of motion;Visual/perceptual remediation/compensation    PT Next Visit Plan  continue higher level balance training focusing on eyes closed, compliant/unstable surfaces.  Balance reactions especially use of hip strategy.  balance while stepping over objects/cone taps    PT Home Exercise Plan  Access Code: [email or web address]    Consulted and Agree with Plan of Care  Patient       Patient will benefit from skilled therapeutic intervention in order to improve the following deficits and impairments:  Decreased balance, Dizziness, Abnormal gait, Decreased strength, Difficulty walking, Decreased activity tolerance  Visit Diagnosis: Unsteadiness on feet  Other abnormalities of gait and mobility  Muscle weakness (generalized)     Problem List Patient Active Problem List   Diagnosis Date Noted  . Arthralgia 08/01/2017  . COPD with asthma ([place]) 05/04/2017  . Angina pectoris ([place]) 01/05/2017  . Insomnia 11/23/2016  . Chronic nonintractable headache 11/23/2016  . Pancreatic insufficiency 10/28/2016  . Carotid artery disease ([place]) 05/19/2016  . Fatigue 05/19/2016  . Anemia 03/27/2016  . Type 1 diabetes mellitus ([place]) 07/21/2015  . B12 deficiency 03/31/2015  . OSA (obstructive sleep apnea) 11/20/2014  . Fecal incontinence 08/28/2014  . 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 Falls 08/28/2014  . Neck pain of over 3 months duration 08/28/2014  . H/O total knee replacement 02/20/2014  . Obesity (BMI 30-39.9) 11/12/2013  . Chronic diastolic CHF (congestive heart failure) ([place]) 11/17/2012  . Meniscus, lateral, anterior horn  derangement 11/10/2011  . Medial meniscus, posterior horn derangement 11/10/2011  . Osteoarthritis of right knee 11/10/2011  . Vertigo 10/05/2011  . HTN (hypertension), benign 10/05/2011  . Coronary artery disease 08/27/2010  . MUSCLE CRAMPS 12/29/2009  . History of myocardial infarction 11/19/2009  . Extrinsic asthma 11/19/2009  . GERD 11/19/2009  . Cough 11/19/2009  . Hyperlipidemia 11/18/2009    , PTA   A  02/07/2018, 3:23 PM  [facility] [address] [place], [place], [address] Phone: [phone]   Fax:  [phone]  Name: ETHELENE CLOSSER MRN: [id number] Date of Birth: 05/22/1940

## 2018-02-13 ENCOUNTER — Encounter: Payer: Self-pay | Admitting: Physical Therapy

## 2018-02-13 ENCOUNTER — Ambulatory Visit: Payer: Medicare Other | Attending: Neurology | Admitting: Physical Therapy

## 2018-02-13 DIAGNOSIS — R42 Dizziness and giddiness: Secondary | ICD-10-CM | POA: Insufficient documentation

## 2018-02-13 DIAGNOSIS — M6281 Muscle weakness (generalized): Secondary | ICD-10-CM | POA: Insufficient documentation

## 2018-02-13 DIAGNOSIS — M542 Cervicalgia: Secondary | ICD-10-CM | POA: Insufficient documentation

## 2018-02-13 DIAGNOSIS — R2689 Other abnormalities of gait and mobility: Secondary | ICD-10-CM

## 2018-02-13 DIAGNOSIS — R2681 Unsteadiness on feet: Secondary | ICD-10-CM | POA: Diagnosis present

## 2018-02-13 DIAGNOSIS — R293 Abnormal posture: Secondary | ICD-10-CM

## 2018-02-13 NOTE — Therapy (Signed)
[facility] [address] [place], [place], [address] Phone: [phone]   Fax:  [phone]  Physical Therapy Treatment  Patient Details  Name: [name] MRN: [id number] Date of Birth: 30-Nov-1940 Referring Provider (PT): Metta Clines, DO   Encounter Date: 02/13/2018  PT End of Session - 02/13/18 1523    Visit Number  16    Number of Visits  20    Date for PT Re-Evaluation  02/17/18    Authorization Type  [employer] Medicare - 10th visit PN    PT Start Time  1320    PT Stop Time  1405    PT Time Calculation (min)  45 min    Activity Tolerance  Patient tolerated treatment well    Behavior During Therapy  [facility] for tasks assessed/performed       Past Medical History:  Diagnosis Date  . Adenomatous colon polyp   . Allergy   . Anxiety   . Asthma   . Chest pain   . Chronic diastolic CHF (congestive heart failure) ([place])   . COPD (chronic obstructive pulmonary disease) ([place])    pt is unsure if has been officially diagnosed  . Coronary artery disease    CABG '09- cathed 12/09, 9/10, 6/11, 3/14 and 12/13/16- medical Rx  . Diabetes mellitus   . GERD (gastroesophageal reflux disease)   . Hiatal hernia   . Hyperlipidemia   . Hypertension   . Myocardial infarction ([place]) 2009  . PONV (postoperative nausea and vomiting)   . Schatzki's ring   . Shoulder injury    resolved after shoulder surgery  . Sleep apnea    not on cpap    Past Surgical History:  Procedure Laterality Date  . ABDOMINAL HYSTERECTOMY    . APPENDECTOMY     came out with Hysterectomy  . CARDIAC CATHETERIZATION  07/23/2009   EF 60%  . CARDIAC CATHETERIZATION  10/11/2008  . CARDIAC CATHETERIZATION  03/01/2007   EF 75-80%  . CARDIOVASCULAR STRESS TEST  11/15/2007   EF 60%  . COLONOSCOPY    . CORONARY ARTERY BYPASS GRAFT     SEVERELY DISEASED SAPHENOUS VEIN GRAFT TO THE RIGHT CORONARY ARTERY BUT WITH FAIRLY WELL PRESERVED FLOW TO THE DISTAL RIGHT CORONARY  ARTERY FROM THE NATIVE CIRCULATION-RESTART  CATH IN JUNE 2000, REVEALS MILD/MODERATE  CAD WITH GOOD FLOW DOWN HER LAD  . ESOPHAGOGASTRODUODENOSCOPY    . EYE SURGERY     bilateral cataract surgery with lens implant  . LEFT HEART CATH AND CORS/GRAFTS ANGIOGRAPHY N/A 12/14/2016   Procedure: LEFT HEART CATH AND CORS/GRAFTS ANGIOGRAPHY;  Surgeon: Jettie Booze, MD;  Location: [facility];  Service: Cardiovascular;  Laterality: N/A;  . lense removal Left   . POLYPECTOMY    . ROTATOR CUFF REPAIR     right and left  . TONSILLECTOMY     age 11  . 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 tumor removed kidney    . UPPER GASTROINTESTINAL ENDOSCOPY    . US ECHOCARDIOGRAPHY  03/08/2008   EF 55-60%    There were no vitals filed for this visit.  Subjective Assessment - 02/13/18 1321    Subjective  Pt reports that this weekend she had an episode of dizziness all day on Sunday.  Went to sleep for a nap - and when she  woke up she was very dizzy and off balance rest of the day.   No dizziness today but more fatigued.      Pertinent History  CABG x 3, OSA, history of smoking (quit 30 y/ago), asthma, diabetic polyneuropathy, vertigo, DM, COPD, HTN, CHF, cervicogenic headaches    Patient Stated Goals  "Hopefully, be able to improve balance."    Currently in Pain?  No/denies         [facility] PT Assessment - 02/13/18 1334      Berg Balance Test   Sit to Stand  Able to stand without using hands and stabilize independently    Standing Unsupported  Able to stand safely 2 minutes    Sitting with Back Unsupported but Feet Supported on Floor or Stool  Able to sit safely and securely 2 minutes    Stand to Sit  Sits safely with minimal use of hands    Transfers  Able to transfer safely, minor use of hands    Standing Unsupported with Eyes Closed  Able to stand 10  seconds with supervision    Standing Ubsupported with Feet Together  Able to place feet together independently and stand 1 minute safely    From Standing, Reach Forward with Outstretched Arm  Can reach confidently >25 cm (10")    From Standing Position, Pick up Object from Floor  Able to pick up shoe safely and easily    From Standing Position, Turn to Look Behind Over each Shoulder  Looks behind from both sides and weight shifts well    Turn 360 Degrees  Able to turn 360 degrees safely in 4 seconds or less    Standing Unsupported, Alternately Place Feet on Step/Stool  Able to stand independently and safely and complete 8 steps in 20 seconds    Standing Unsupported, One Foot in Front  Able to place foot tandem independently and hold 30 seconds    Standing on One Leg  Able to lift leg independently and hold > 10 seconds    Total Score  55    Berg comment:  55/56      Functional Gait  Assessment   Gait assessed   Yes    Gait Level Surface  Walks 20 ft, slow speed, abnormal gait pattern, evidence for imbalance or deviates 10-15 in outside of the 12 in walkway width. Requires more than 7 sec to ambulate 20 ft.    Change in Gait Speed  Able to smoothly change walking speed without loss of balance or gait deviation. Deviate no more than 6 in outside of the 12 in walkway width.    Gait with Horizontal Head Turns  Performs head turns smoothly with slight change in gait velocity (eg, minor disruption to smooth gait path), deviates 6-10 in outside 12 in walkway width, or uses an assistive device.    Gait with Vertical Head Turns  Performs task with slight change in gait velocity (eg, minor disruption to smooth gait path), deviates 6 - 10 in outside 12 in walkway width or uses assistive device    Gait and Pivot Turn  Pivot turns safely in greater than 3 sec and stops with no loss of balance, or pivot turns safely within 3 sec and stops with mild imbalance, requires small steps to catch balance.    Step Over  Obstacle  Is able to step over one shoe box (4.5 in total height) but must slow down and adjust steps to clear box safely. May require verbal  cueing.    Gait with Narrow Base of Support  Ambulates less than 4 steps heel to toe or cannot perform without assistance.    Gait with Eyes Closed  Walks 20 ft, slow speed, abnormal gait pattern, evidence for imbalance, deviates 10-15 in outside 12 in walkway width. Requires more than 9 sec to ambulate 20 ft.    Ambulating Backwards  Walks 20 ft, uses assistive device, slower speed, mild gait deviations, deviates 6-10 in outside 12 in walkway width.    Steps  Alternating feet, must use rail.    Total Score  16    FGA comment:  16/30                    Vestibular Treatment/Exercise - 02/13/18 1358      Vestibular Treatment/Exercise   Vestibular Treatment Provided  Gaze    Gaze Exercises  X1 Viewing Horizontal;X1 Viewing Vertical      X1 Viewing Horizontal   Foot Position  feet apart, no UE support    Reps  1    Comments  60 seconds, verbal cues for technique      X1 Viewing Vertical   Foot Position  feet apart, no UE support    Reps  1    Comments  60 seconds, verbal cues for technique            PT Education - 02/13/18 1522    Education Details  due to decline in objective measures, balance will renew for 4 more visits at next visit    Person(s) Educated  Patient    Methods  Explanation    Comprehension  Verbalized understanding       PT Short Term Goals - 01/18/18 1639      PT SHORT TERM GOAL #1   Title  = LTG        PT Long Term Goals - 02/13/18 1351      PT LONG TERM GOAL #1   Title  patient to be independent with advanced HEP for LE strength and balance    Time  4    Period  Weeks    Status  On-going      PT LONG TERM GOAL #2   Title  Pt will report being able to ambulate >3 laps around track at [facility] 2-3x/week with minimal symptoms    Baseline  Still doing 3 laps - may do 4 on Thursday    Time  4     Period  Weeks    Status  On-going      PT LONG TERM GOAL #3   Title  patient to improve FGA to >/=24/30 demonstrating reduced fall risk    Baseline  21/30 > decreased to 16/30 but pt reporting increased fatigue today and increased dizziness day before    Time  4    Period  Weeks    Status  Not Met      PT LONG TERM GOAL #5   Title  patient to improve SOT composite score to within age related norms    Baseline  50    Time  4    Period  Weeks    Status  Revised            Plan - 02/13/18 1523    Clinical Impression Statement  Treatment session today focused on assessment of progress towards LTG; due to pt's steady progress, PT was anticipating D/C at next visit.  Pt did demonstrate continued  improvement in static balance as indicated by BERG score of 55/56 but when performing dynamic gait pt noted to have a decline in safety and balance (as evidenced by FGA decline to 16/30).  Pt continues to be at increased falls risk; discussed adding 4 more visits to POC to continue to address.  Pt agreeable.  Reviewed and updated x 1 viewing.  Will continue to check LTG and plan to renew next session.    Rehab Potential  Good    PT Frequency  2x / week    PT Duration  4 weeks    PT Treatment/Interventions  ADLs/Self Care Home Management;Canalith Repostioning;Therapeutic exercise;Therapeutic activities;Functional mobility training;Stair training;Gait training;Balance training;Neuromuscular re-education;Patient/family education;Manual techniques;Vestibular;Passive range of motion;Visual/perceptual remediation/compensation    PT Next Visit Plan  Check final LTG - SOT and review/revise HEP if needed.  SEND TO ME AND I WILL RECERTIFY FOR 1X/WEEK X 4 MORE WEEKS - VISITS HAVE ALREADY BEEN SCHEDULED.  Continue to progress x 1 viewing.  Continue to work on balance with eyes closed, backwards walking, stepping over objects, tandem gait/stand    PT Home Exercise Plan  Access Code: [email or web address]    Consulted and  Agree with Plan of Care  Patient       Patient will benefit from skilled therapeutic intervention in order to improve the following deficits and impairments:  Decreased balance, Dizziness, Abnormal gait, Decreased strength, Difficulty walking, Decreased activity tolerance  Visit Diagnosis: Unsteadiness on feet  Other abnormalities of gait and mobility  Muscle weakness (generalized)  Cervicalgia  Abnormal posture     Problem List Patient Active Problem List   Diagnosis Date Noted  . Arthralgia 08/01/2017  . COPD with asthma ([place]) 05/04/2017  . Angina pectoris ([place]) 01/05/2017  . Insomnia 11/23/2016  . Chronic nonintractable headache 11/23/2016  . Pancreatic insufficiency 10/28/2016  . Carotid artery disease ([place]) 05/19/2016  . Fatigue 05/19/2016  . Anemia 03/27/2016  . Type 1 diabetes mellitus ([place]) 07/21/2015  . B12 deficiency 03/31/2015  . OSA (obstructive sleep apnea) 11/20/2014  . Fecal incontinence 08/28/2014  . 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 Falls 08/28/2014  . Neck pain of over 3 months duration 08/28/2014  . H/O total knee replacement 02/20/2014  . Obesity (BMI 30-39.9) 11/12/2013  . Chronic diastolic CHF (congestive heart failure) ([place]) 11/17/2012  . Meniscus, lateral, anterior horn derangement 11/10/2011  . Medial meniscus, posterior horn derangement 11/10/2011  . Osteoarthritis of right knee 11/10/2011  . Vertigo 10/05/2011  . HTN (hypertension), benign 10/05/2011  . Coronary artery disease 08/27/2010  . MUSCLE CRAMPS 12/29/2009  . History of myocardial infarction 11/19/2009  . Extrinsic asthma 11/19/2009  . GERD 11/19/2009  . Cough 11/19/2009  . Hyperlipidemia 11/18/2009    Rico Junker, PT, DPT 02/13/18    3:28 PM    [facility] [address] [place], [place], [address] Phone: [phone]   Fax:  [phone]  Name: [name] MRN: [id number] Date of Birth:  [birth year]

## 2018-02-17 ENCOUNTER — Ambulatory Visit: Payer: Medicare Other | Admitting: Physical Therapy

## 2018-02-17 ENCOUNTER — Encounter: Payer: Self-pay | Admitting: Physical Therapy

## 2018-02-17 DIAGNOSIS — R2681 Unsteadiness on feet: Secondary | ICD-10-CM | POA: Diagnosis not present

## 2018-02-17 DIAGNOSIS — R293 Abnormal posture: Secondary | ICD-10-CM

## 2018-02-17 DIAGNOSIS — M542 Cervicalgia: Secondary | ICD-10-CM

## 2018-02-17 DIAGNOSIS — R42 Dizziness and giddiness: Secondary | ICD-10-CM

## 2018-02-17 DIAGNOSIS — M6281 Muscle weakness (generalized): Secondary | ICD-10-CM

## 2018-02-17 DIAGNOSIS — R2689 Other abnormalities of gait and mobility: Secondary | ICD-10-CM

## 2018-02-17 NOTE — Patient Instructions (Signed)
Access Code: [email or web address]  URL: [email or web address]  Date: 02/17/2018  Prepared by: Willow Ora   Exercises  Standing Gaze Stabilization with Head Rotation - 2 sets - 60 seconds hold - 2-3x daily - 7x weekly  Standing Gaze Stabilization with Head Nod - 2 sets - 60 second hold - 2-3x daily - 7x weekly  Romberg Stance Eyes Closed on Foam Pad - 3 reps - 1 sets - 20 hold - 1x daily - 5x weekly  Walking with Eyes Closed and Counter Support - 10 reps - 4 sets - 1x daily - 7x weekly  Wide Stance with Eyes Closed and Head Rotation on Foam Pad - 10 reps - 1 sets - 1x daily - 5x weekly

## 2018-02-17 NOTE — Therapy (Signed)
[facility] [address] [place], [place], [address] Phone: [phone]   Fax:  [phone]  Physical Therapy Treatment  Patient Details  Name: [name] MRN: [id number] Date of Birth: 1940-10-13 Referring Provider (PT): Metta Clines, DO   Encounter Date: 02/17/2018   02/17/18 1409  PT Visits / Re-Eval  Visit Number 17  Number of Visits 20  Date for PT Re-Evaluation 02/17/18  Authorization  Authorization Type [employer] Medicare - 10th visit PN  PT Time Calculation  PT Start Time 1403  PT Stop Time 1445  PT Time Calculation (min) 42 min  PT - End of Session  Equipment Utilized During Treatment Gait belt  Activity Tolerance Patient tolerated treatment well  Behavior During Therapy [facility] for tasks assessed/performed     Past Medical History:  Diagnosis Date  . Adenomatous colon polyp   . Allergy   . Anxiety   . Asthma   . Chest pain   . Chronic diastolic CHF (congestive heart failure) ([place])   . COPD (chronic obstructive pulmonary disease) ([place])    pt is unsure if has been officially diagnosed  . Coronary artery disease    CABG '09- cathed 12/09, 9/10, 6/11, 3/14 and 12/13/16- medical Rx  . Diabetes mellitus   . GERD (gastroesophageal reflux disease)   . Hiatal hernia   . Hyperlipidemia   . Hypertension   . Myocardial infarction ([place]) 2009  . PONV (postoperative nausea and vomiting)   . Schatzki's ring   . Shoulder injury    resolved after shoulder surgery  . Sleep apnea    not on cpap    Past Surgical History:  Procedure Laterality Date  . ABDOMINAL HYSTERECTOMY    . APPENDECTOMY     came out with Hysterectomy  . CARDIAC CATHETERIZATION  07/23/2009   EF 60%  . CARDIAC CATHETERIZATION  10/11/2008  . CARDIAC CATHETERIZATION  03/01/2007   EF 75-80%  . CARDIOVASCULAR STRESS TEST  11/15/2007   EF 60%  . COLONOSCOPY    . CORONARY ARTERY BYPASS GRAFT     SEVERELY DISEASED SAPHENOUS VEIN GRAFT TO THE RIGHT  CORONARY ARTERY BUT WITH FAIRLY WELL PRESERVED FLOW TO THE DISTAL RIGHT CORONARY ARTERY FROM THE NATIVE CIRCULATION-RESTART  CATH IN JUNE 2000, REVEALS MILD/MODERATE  CAD WITH GOOD FLOW DOWN HER LAD  . ESOPHAGOGASTRODUODENOSCOPY    . EYE SURGERY     bilateral cataract surgery with lens implant  . LEFT HEART CATH AND CORS/GRAFTS ANGIOGRAPHY N/A 12/14/2016   Procedure: LEFT HEART CATH AND CORS/GRAFTS ANGIOGRAPHY;  Surgeon: Jettie Booze, MD;  Location: [facility];  Service: Cardiovascular;  Laterality: N/A;  . lense removal Left   . POLYPECTOMY    . ROTATOR CUFF REPAIR     right and left  . TONSILLECTOMY     age 78  . 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 tumor removed kidney    . UPPER GASTROINTESTINAL ENDOSCOPY    . US ECHOCARDIOGRAPHY  03/08/2008   EF 55-60%    There were no vitals filed for this visit.  Subjective Assessment - 02/17/18 1408    Subjective  No new complaints. No falls or pain. Does have a chronic headache everyday that she takes Tylenol for when it get's really bad.     Pertinent History  CABG x 3, OSA, history  of smoking (quit 30 y/ago), asthma, diabetic polyneuropathy, vertigo, DM, COPD, HTN, CHF, cervicogenic headaches    Patient Stated Goals  "Hopefully, be able to improve balance."    Currently in Pain?  No/denies       Treatment:  Neuro re-ed: sensory organization test performed with following results: Conditions: 1: all 3 above 2: all 3 above 3: below, fall, below 4: fall, below x2 5: all 3 falls 6: all 3 falls Composite score: 29 Sensory Analysis Som: above Vis: below (~25%) Vest: below (<5%) Pref: below ( ~60%) Strategy analysis: tends toward ankle strategy dominant      COG alignment: posterior left   issued the following to HEP: Access Code: [email or web address]  URL:  [email or web address]  Date: 02/17/2018  Prepared by: Willow Ora   Exercises  Standing Gaze Stabilization with Head Rotation - 2 sets - 60 seconds hold - 2-3x daily - 7x weekly  Standing Gaze Stabilization with Head Nod - 2 sets - 60 second hold - 2-3x daily - 7x weekly  Romberg Stance Eyes Closed on Foam Pad - 3 reps - 1 sets - 20 hold - 1x daily - 5x weekly  Walking with Eyes Closed and Counter Support - 10 reps - 4 sets - 1x daily - 7x weekly  Wide Stance with Eyes Closed and Head Rotation on Foam Pad - 10 reps - 1 sets - 1x daily - 5x weekly           PT Education - 02/17/18 1445    Education Details  results of SOT and updated HEP program    Person(s) Educated  Patient    Methods  Explanation;Demonstration;Verbal cues;Handout    Comprehension  Verbalized understanding;Returned demonstration;Verbal cues required;Need further instruction       PT Short Term Goals - 01/18/18 1639      PT SHORT TERM GOAL #1   Title  = LTG        PT Long Term Goals - 02/13/18 1351      PT LONG TERM GOAL #1   Title  patient to be independent with advanced HEP for LE strength and balance    Time  4    Period  Weeks    Status  On-going      PT LONG TERM GOAL #2   Title  Pt will report being able to ambulate >3 laps around track at [facility] 2-3x/week with minimal symptoms    Baseline  Still doing 3 laps - may do 4 on Thursday    Time  4    Period  Weeks    Status  On-going      PT LONG TERM GOAL #3   Title  patient to improve FGA to >/=24/30 demonstrating reduced fall risk    Baseline  21/30 > decreased to 16/30 but pt reporting increased fatigue today and increased dizziness day before    Time  4    Period  Weeks    Status  Not Met      PT LONG TERM GOAL #5   Title  patient to improve SOT composite score to within age related norms    Baseline  50    Time  4    Period  Weeks    Status  Revised          02/17/18 1409  Plan  Clinical Impression Statement  Today's skilled session focused on checking the final goal by performing SOT with pt scoring lower than when it was last  done. Remainder of session foccused on advancing pt's HEP. The pt is progressign toward goals and should benefit from continued PT to progress toward unmet goals of recert (primary PT to completed the recert).  Pt will benefit from skilled therapeutic intervention in order to improve on the following deficits Decreased balance;Dizziness;Abnormal gait;Decreased strength;Difficulty walking;Decreased activity tolerance  Rehab Potential Good  PT Frequency 2x / week  PT Duration 4 weeks  PT Treatment/Interventions ADLs/Self Care Home Management;Canalith Repostioning;Therapeutic exercise;Therapeutic activities;Functional mobility training;Stair training;Gait training;Balance training;Neuromuscular re-education;Patient/family education;Manual techniques;Vestibular;Passive range of motion;Visual/perceptual remediation/compensation  PT Next Visit Plan Continue to progress x 1 viewing.  Continue to work on balance with eyes closed, backwards walking, stepping over objects, tandem gait/stand  PT Home Exercise Plan Access Code: [email or web address]  Consulted and Agree with Plan of Care Patient         Patient will benefit from skilled therapeutic intervention in order to improve the following deficits and impairments:  (P) Decreased balance, Dizziness, Abnormal gait, Decreased strength, Difficulty walking, Decreased activity tolerance  Visit Diagnosis: Unsteadiness on feet  Other abnormalities of gait and mobility  Muscle weakness (generalized)     Problem List Patient Active Problem List   Diagnosis Date Noted  . Arthralgia 08/01/2017  . COPD with asthma ([place]) 05/04/2017  . Angina pectoris ([place]) 01/05/2017  . Insomnia 11/23/2016  . Chronic nonintractable headache 11/23/2016  . Pancreatic insufficiency 10/28/2016  . Carotid artery disease ([place]) 05/19/2016  . Fatigue 05/19/2016  .  Anemia 03/27/2016  . Type 1 diabetes mellitus ([place]) 07/21/2015  . B12 deficiency 03/31/2015  . OSA (obstructive sleep apnea) 11/20/2014  . Fecal incontinence 08/28/2014  . 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 Falls 08/28/2014  . Neck pain of over 3 months duration 08/28/2014  . H/O total knee replacement 02/20/2014  . Obesity (BMI 30-39.9) 11/12/2013  . Chronic diastolic CHF (congestive heart failure) ([place]) 11/17/2012  . Meniscus, lateral, anterior horn derangement 11/10/2011  . Medial meniscus, posterior horn derangement 11/10/2011  . Osteoarthritis of right knee 11/10/2011  . Vertigo 10/05/2011  . HTN (hypertension), benign 10/05/2011  . Coronary artery disease 08/27/2010  . MUSCLE CRAMPS 12/29/2009  . History of myocardial infarction 11/19/2009  . Extrinsic asthma 11/19/2009  . GERD 11/19/2009  . Cough 11/19/2009  . Hyperlipidemia 11/18/2009    Willow Ora, PTA, [facility] [address], [place], [place] [address] [phone] 02/19/18, 10:44 PM   Name: [name] MRN: [id number] Date of Birth: 1940/07/10

## 2018-02-20 NOTE — Addendum Note (Signed)
Addended by: Rico Junker on: 02/20/2018 08:21 AM   Modules accepted: Orders

## 2018-02-21 ENCOUNTER — Ambulatory Visit: Payer: Medicare Other | Admitting: Neurology

## 2018-02-24 ENCOUNTER — Ambulatory Visit (INDEPENDENT_AMBULATORY_CARE_PROVIDER_SITE_OTHER): Payer: Medicare Other | Admitting: Pulmonary Disease

## 2018-02-24 ENCOUNTER — Encounter: Payer: Self-pay | Admitting: Pulmonary Disease

## 2018-02-24 VITALS — BP 130/84 | HR 80 | Ht 62.0 in | Wt 200.2 lb

## 2018-02-24 DIAGNOSIS — J449 Chronic obstructive pulmonary disease, unspecified: Secondary | ICD-10-CM

## 2018-02-24 DIAGNOSIS — G4734 Idiopathic sleep related nonobstructive alveolar hypoventilation: Secondary | ICD-10-CM

## 2018-02-24 DIAGNOSIS — G4733 Obstructive sleep apnea (adult) (pediatric): Secondary | ICD-10-CM

## 2018-02-24 MED ORDER — BUDESONIDE-FORMOTEROL FUMARATE 160-4.5 MCG/ACT IN AERO: 2.0000 | INHALATION_SPRAY | Freq: Two times a day (BID) | RESPIRATORY_TRACT | 5 refills | Status: DC

## 2018-02-24 MED ORDER — BUDESONIDE-FORMOTEROL FUMARATE 160-4.5 MCG/ACT IN AERO: 2.0000 | INHALATION_SPRAY | Freq: Two times a day (BID) | RESPIRATORY_TRACT | 0 refills | Status: DC

## 2018-02-24 NOTE — Progress Notes (Signed)
Subjective:   PATIENT ID: [name] GENDER: female DOB: 10-Jun-1940, MRN: [id number]  Synopsis: Former patient of Dr. Ashok Cordia with COPD-Asthma overlap syndrome, GERD, OSA; She is a retired Education officer, museum from [employer]. She smoked 1/2 ppd for about 35 years, quit at age 78.  She like to avoid steroids when she has flares of her disease.  She will take them if she has to though.   HPI  Chief Complaint  Patient presents with  . Follow-up    pt c/o increased sob, prod cough with clear/white mucus.  denies any sinus congestion, chest pain.    [name] says taht she is suffering with headaches in the middle of the night and early mornings.  She says that she is supposed to use CPAP but the insurance company said she wasn't using it enough.  She feels that compliance report didn't coincide with her own personal use.    Her breathing has been worse lately but she says that she has not been taking her Symbicort for two weeks.  She says that her breathing has worsened with the weather change.  Past Medical History:  Diagnosis Date  . Adenomatous colon polyp   . Allergy   . Anxiety   . Asthma   . Chest pain   . Chronic diastolic CHF (congestive heart failure) ([place])   . COPD (chronic obstructive pulmonary disease) ([place])    pt is unsure if has been officially diagnosed  . Coronary artery disease    CABG '09- cathed 12/09, 9/10, 6/11, 3/14 and 12/13/16- medical Rx  . Diabetes mellitus   . GERD (gastroesophageal reflux disease)   . Hiatal hernia   . Hyperlipidemia   . Hypertension   . Myocardial infarction ([place]) 2009  . PONV (postoperative nausea and vomiting)   . Schatzki's ring   . Shoulder injury    resolved after shoulder surgery  . Sleep apnea    not on cpap          Review of Systems  Constitutional: Negative for malaise/fatigue and weight loss.  HENT: Negative for congestion, nosebleeds and sore throat.   Respiratory: Positive for cough, sputum production,  shortness of breath and wheezing.   Cardiovascular: Negative for orthopnea, claudication and leg swelling.  Neurological: Negative for weakness.      Objective:  Physical Exam   Vitals:   02/24/18 0911  BP: 130/84  Pulse: 80  SpO2: 94%  Weight: 200 lb 3.2 oz (90.8 kg)  Height: '5\' 2"'  (1.575 m)    Gen: well appearing HENT: OP clear, TM's clear, neck supple PULM: CTA B, normal percussion CV: RRR, no mgr, trace edema GI: BS+, soft, nontender Derm: no cyanosis or rash Psyche: normal mood and affect    CBC    Component Value Date/Time   WBC 6.0 12/20/2017 0740   RBC 4.54 12/20/2017 0740   HGB 11.2 (L) 12/20/2017 0740   HGB 10.6 (L) 12/10/2016 1346   HCT 35.1 (L) 12/20/2017 0740   HCT 34.1 12/10/2016 1346   PLT 285.0 12/20/2017 0740   PLT 270 12/10/2016 1346   MCV 77.3 (L) 12/20/2017 0740   MCV 78 (L) 12/10/2016 1346   MCH 23.9 (L) 01/04/2017 1505   MCHC 31.8 12/20/2017 0740   RDW 17.6 (H) 12/20/2017 0740   RDW 16.1 (H) 12/10/2016 1346   LYMPHSABS 2.1 12/20/2017 0740   LYMPHSABS 2.2 06/21/2016 1628   MONOABS 0.5 12/20/2017 0740   EOSABS 0.2 12/20/2017 0740  EOSABS 0.1 06/21/2016 1628   BASOSABS 0.0 12/20/2017 0740   BASOSABS 0.0 06/21/2016 1628    PFT 12/23/16: FVC 1.44 L (72%) FEV1 0.96 L (63%) FEV1/FVC 0.67 FEF 25-75 0.55 L (42%) negative bronchodilator response TLC 3.85 L (78%) RV 102% DLCO uncorrected 45% 06/04/15: FVC 1.57 L (77%) FEV1 1.01 L (64%) FEV1/FVC 0.65 FEF 25-75 0.54 L (39%) negative bronchodilator response 02/21/15: FVC 1.60 L (78%) FEV1 1.03 L (65%) FEV1/FVC 0.64 FEF 25-75 0.51 L (37%) positive bronchodilator response 11/20/14: FVC 1.44 L (70%) FEV1 0.91 L (57%) FEV1/FVC 0.63 FEF 25-75 0.47 L (33%) positive bronchodilator response TLC 3.92 L (79%) RV 103% DLCO uncorrected 64% 11/12/13: FVC 1.57 L (74%) FEV1 1.09 L (67%) FEV1/FVC 0.70 FEF 25-75 0.72 L (50%) negative bronchodilator response 08/04/11: FVC 1.61 L (60%) FEV1 0.89 L (48%) FEV1/FVC  0.56 FEF 25-75 0.29 L (14%) positive bronchodilator response TLC 3.67 L (80%) RV 103% ERV 46% DLCO corrected 38%  6MWT 11/20/14:  Walked 336 meters / Baseline Sat 100% on RA / Nadir Sat 99% on RA (pt c/o pain in her right neck w/ dyspnea)  IMAGING November 2018 CT angiogram chest images independently reviewed showing centrilobular predominant emphysema, mild in an upper lobe distribution, no PE, there is a 5 mm right upper lobe nodule.  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 LIMA to LAD is patent.  Prox RCA lesion is 25% stenosed. SVG to PDA is occluded.  Mid RCA lesion is 25% stenosed.  Circumflex is a large tortuous vessel.  Ost 1st Diag lesion is 100% stenosed. SVG to diagonal is patent.  The left ventricular systolic function is normal.  LV end diastolic pressure is normal.  The left ventricular ejection fraction is 55-65% by visual estimate.  There is no aortic valve stenosis.  TTE (10/06/11): LV normal in size. Normal regional wall motion. EF 60-65%. Grade 1 diastolic dysfunction. LA & RA normal in size. 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 RV normal in size and function. RVSP 19 mmHg. No aortic stenosis or regurgitation. No mitral stenosis or regurgitation. No pulmonic stenosis. Trivial tricuspid regurgitation. No pericardial effusion.  LABS 03/13/15 Procalcitonin:  <0.1 CBC: 10.9/11.8/37.3/245  10/08/14 IgG: 1060 IgM: 112 IgA: 272 IgE: 5 CBC: 6.1/11.3/36.0/293 Differential: Eos 0.3 (4.5%)  RAST panel: Negative Aspergillus antigen: <0.1  1/26/9 ABG:  7.39/44/113  Sleep study: 03/2017 Home sleep study showed no sleep apnea but her oxygen level dropped to 2L [place]    Assessment & Plan:   COPD with asthma (HCC)  OSA (obstructive sleep apnea)  Nocturnal hypoxemia  Discussion: Ariele has worsening headaches and worsening shortness of breath because she has not taken any of the treatment we have  recommended.  I do not think we need to reinvent the wheel here.  I think we just need to get her back on the medicines which we have told her for years she needs to take.  Specifically Symbicort and oxygen.  Plan: COPD-asthma overlap syndrome with acute worsening: Start taking Symbicort again 2 puffs twice a day If these medicines are expensive then please ask that your insurance company provide you with a medication formulary.  Once you receive the formulary call us so that we can navigate this with you to help find the least expensive option. Use albuterol as needed for chest tightness wheezing or shortness of breath  Nocturnal hypoxemia: Start using oxygen again 2 L at night, we will prescribe new  tubing for you  I will plan on seeing you back in 4 months.  However, if restarting oxygen and Symbicort do not help relieve your symptoms then please call us so that we can see you sooner  Greater than 50% of this 25-minute visit was spent face-to-face    Current Outpatient Medications:  .  albuterol (PROVENTIL HFA;VENTOLIN HFA) 108 (90 BASE) MCG/ACT inhaler, Inhale 2 puffs into the lungs every 6 (six) hours as needed. For shortness of breath., Disp: 1 Inhaler, Rfl: 0 .  albuterol (PROVENTIL) (2.5 MG/3ML) 0.083% nebulizer solution, Take 3 mLs (2.5 mg total) by nebulization every 6 (six) hours as needed for wheezing or shortness of breath., Disp: 75 mL, Rfl: 12 .  alum & mag hydroxide-simeth (MAALOX/MYLANTA) 200-200-20 MG/5ML suspension, Take 30 mLs as needed by mouth for indigestion or heartburn., Disp: , Rfl:  .  aspirin EC 81 MG tablet, Take 1 tablet (81 mg total) by mouth daily., Disp: , Rfl:  .  baclofen (LIORESAL) 20 MG tablet, Take 1 tablet (20 mg total) by mouth 3 (three) times daily., Disp: 90 tablet, Rfl: 5 .  Blood Glucose Monitoring Suppl (ONE TOUCH ULTRA MINI) w/Device KIT, 3 (three) times daily. for testing, Disp: , Rfl: 0 .  budesonide-formoterol (SYMBICORT) 160-4.5 MCG/ACT  inhaler, Inhale 2 puffs into the lungs 2 (two) times daily., Disp: , Rfl:  .  calcium carbonate (TUMS - DOSED IN MG ELEMENTAL CALCIUM) 500 MG chewable tablet, Chew 2 tablets daily as needed by mouth for indigestion or heartburn., Disp: , Rfl:  .  Cholecalciferol (VITAMIN D) 2000 UNITS tablet, Take 2,000 Units by mouth daily. , Disp: , Rfl:  .  clonazePAM (KLONOPIN) 0.5 MG tablet, Take 0.5 mg daily by mouth., Disp: , Rfl:  .  CREON 24000-76000 units CPEP, TAKE 2 CAPSULES BY MOUTH 3 TIMES A DAY WITH MEALS (TAK 1 CAPSULE WITH A SNACK), Disp: 210 capsule, Rfl: 1 .  diclofenac sodium (VOLTAREN) 1 % GEL, Apply 2 g topically 4 (four) times daily as needed (muscle pain)., Disp: , Rfl:  .  diltiazem (CARDIZEM CD) 240 MG 24 hr capsule, TAKE 1 CAPSULE BY MOUTH EVERY DAY, Disp: 90 capsule, Rfl: 2 .  ezetimibe (ZETIA) 10 MG tablet, Take 10 mg by mouth daily., Disp: , Rfl: 6 .  famotidine (PEPCID) 40 MG tablet, Take 1 tablet (40 mg total) by mouth daily as needed., Disp: 90 tablet, Rfl: 0 .  fexofenadine (ALLEGRA) 180 MG tablet, Take 1 tablet (180 mg total) by mouth daily. (Patient taking differently: Take 180 mg by mouth daily as needed. ), Disp: 30 tablet, Rfl: 6 .  fluticasone (FLONASE) 50 MCG/ACT nasal spray, Place 2 sprays into both nostrils daily., Disp: 16 g, Rfl: 6 .  Fluticasone-Umeclidin-Vilant (TRELEGY ELLIPTA) 100-62.5-25 MCG/INH AEPB, Inhale 1 puff into the lungs daily., Disp: 1 each, Rfl: 0 .  furosemide (LASIX) 40 MG tablet, Take 1 tablet (40 mg total) by mouth daily., Disp: 90 tablet, Rfl: 3 .  insulin lispro protamine-insulin lispro (HUMALOG 75/25) (75-25) 100 UNIT/ML SUSP, Inject 15-22 Units into the skin See admin instructions. Takes 10 units in the morning and 20  units with supper, Disp: , Rfl:  .  isosorbide mononitrate (IMDUR) 60 MG 24 hr tablet, Take 1.5 tablets (90 mg total) by mouth daily., Disp: 135 tablet, Rfl: 1 .  loperamide (IMODIUM) 2 MG capsule, Take 2 mg by mouth as needed for  diarrhea or loose stools. , Disp: , Rfl:  .  meclizine (ANTIVERT)  25 MG tablet, Take 25 mg by mouth 2 (two) times daily as needed for dizziness. , Disp: , Rfl:  .  metoprolol succinate (TOPROL-XL) 100 MG 24 hr tablet, TAKE 1 TABLET BY MOUTH EVERY DAY, Disp: 90 tablet, Rfl: 1 .  montelukast (SINGULAIR) 5 MG chewable tablet, Chew 1 tablet (5 mg total) by mouth at bedtime., Disp: 30 tablet, Rfl: 3 .  Multiple Vitamin (MULTIVITAMIN WITH MINERALS) TABS, Take 1 tablet by mouth daily., Disp: , Rfl:  .  Nebulizers (COMPRESSOR/NEBULIZER) MISC, Use with albuterol, Disp: 1 each, Rfl: 0 .  nitroGLYCERIN (NITROSTAT) 0.4 MG SL tablet, Place 0.4 mg under the tongue every 5 (five) minutes as needed for chest pain., Disp: , Rfl:  .  nortriptyline (PAMELOR) 50 MG capsule, TAKE 1 CAPSULE (50 MG TOTAL) BY MOUTH AT BEDTIME., Disp: 90 capsule, Rfl: 2 .  ONE TOUCH ULTRA TEST test strip, CHECK BLOOD SUGAR TWICE A DAY DX: E11.9, Disp: , Rfl: 3 .  ONETOUCH DELICA LANCETS 32G MISC, 3 (three) times daily. for testing, Disp: , Rfl: 0 .  pantoprazole (PROTONIX) 40 MG tablet, Take 1 tablet (40 mg total) by mouth 2 (two) times daily before a meal., Disp: 180 tablet, Rfl: 3 .  Spacer/Aero-Holding Chambers (AEROCHAMBER MV) inhaler, Use as instructed, Disp: 1 each, Rfl: 0 .  dapagliflozin propanediol (FARXIGA) 5 MG TABS tablet, Take 5 mg by mouth daily., Disp: 30 tablet, Rfl: 5  Current Facility-Administered Medications:  .  cyanocobalamin ((VITAMIN B-12)) injection 1,000 mcg, 1,000 mcg, Intramuscular, Q30 days, Binnie Rail, MD, 1,000 mcg at 03/26/16 1658

## 2018-02-24 NOTE — Patient Instructions (Signed)
COPD-asthma overlap syndrome with acute worsening: Start taking Symbicort again 2 puffs twice a day If these medicines are expensive then please ask that your insurance company provide you with a medication formulary.  Once you receive the formulary call us so that we can navigate this with you to help find the least expensive option. Use albuterol as needed for chest tightness wheezing or shortness of breath  Nocturnal hypoxemia: Start using oxygen again 2 L at night, we will prescribe new tubing for you  I will plan on seeing you back in 4 months.  However, if restarting oxygen and Symbicort do not help relieve your symptoms then please call us so that we can see you sooner

## 2018-02-24 NOTE — Addendum Note (Signed)
Addended by: Len Blalock on: 02/24/2018 09:44 AM   Modules accepted: Orders

## 2018-03-07 ENCOUNTER — Ambulatory Visit: Payer: Medicare Other | Admitting: Physical Therapy

## 2018-03-14 ENCOUNTER — Encounter: Payer: Medicare Other | Admitting: *Deleted

## 2018-03-14 DIAGNOSIS — Z006 Encounter for examination for normal comparison and control in clinical research program: Secondary | ICD-10-CM

## 2018-03-14 NOTE — Research (Signed)
Subject to research clinic for Randomization visit in the [facility] 4 research trial.  Visit conducted, subject received injection and next visit scheduled.

## 2018-03-16 ENCOUNTER — Encounter: Payer: Self-pay | Admitting: Physical Therapy

## 2018-03-16 ENCOUNTER — Ambulatory Visit: Payer: Medicare Other | Attending: Neurology | Admitting: Physical Therapy

## 2018-03-16 DIAGNOSIS — R42 Dizziness and giddiness: Secondary | ICD-10-CM | POA: Diagnosis present

## 2018-03-16 DIAGNOSIS — R293 Abnormal posture: Secondary | ICD-10-CM | POA: Insufficient documentation

## 2018-03-16 DIAGNOSIS — R2681 Unsteadiness on feet: Secondary | ICD-10-CM | POA: Insufficient documentation

## 2018-03-16 DIAGNOSIS — R2689 Other abnormalities of gait and mobility: Secondary | ICD-10-CM

## 2018-03-16 DIAGNOSIS — M6281 Muscle weakness (generalized): Secondary | ICD-10-CM | POA: Diagnosis present

## 2018-03-16 NOTE — Therapy (Signed)
[facility] [address] [place], [place], [address] Phone: [phone]   Fax:  [phone]  Physical Therapy Treatment  Patient Details  Name: [name] MRN: [id number] Date of Birth: May 29, 1940 Referring Provider (PT): Metta Clines, DO   Encounter Date: 03/16/2018  PT End of Session - 03/16/18 1606    Visit Number  18    Number of Visits  21    Date for PT Re-Evaluation  04/06/18    Authorization Type  [employer] Medicare - 10th visit PN    PT Start Time  1450    PT Stop Time  1532    PT Time Calculation (min)  42 min    Activity Tolerance  Patient tolerated treatment well    Behavior During Therapy  [facility] for tasks assessed/performed       Past Medical History:  Diagnosis Date  . Adenomatous colon polyp   . Allergy   . Anxiety   . Asthma   . Chest pain   . Chronic diastolic CHF (congestive heart failure) ([place])   . COPD (chronic obstructive pulmonary disease) ([place])    pt is unsure if has been officially diagnosed  . Coronary artery disease    CABG '09- cathed 12/09, 9/10, 6/11, 3/14 and 12/13/16- medical Rx  . Diabetes mellitus   . GERD (gastroesophageal reflux disease)   . Hiatal hernia   . Hyperlipidemia   . Hypertension   . Myocardial infarction ([place]) 2009  . PONV (postoperative nausea and vomiting)   . Schatzki's ring   . Shoulder injury    resolved after shoulder surgery  . Sleep apnea    not on cpap    Past Surgical History:  Procedure Laterality Date  . ABDOMINAL HYSTERECTOMY    . APPENDECTOMY     came out with Hysterectomy  . CARDIAC CATHETERIZATION  07/23/2009   EF 60%  . CARDIAC CATHETERIZATION  10/11/2008  . CARDIAC CATHETERIZATION  03/01/2007   EF 75-80%  . CARDIOVASCULAR STRESS TEST  11/15/2007   EF 60%  . COLONOSCOPY    . CORONARY ARTERY BYPASS GRAFT     SEVERELY DISEASED SAPHENOUS VEIN GRAFT TO THE RIGHT CORONARY ARTERY BUT WITH FAIRLY WELL PRESERVED FLOW TO THE DISTAL RIGHT CORONARY  ARTERY FROM THE NATIVE CIRCULATION-RESTART  CATH IN JUNE 2000, REVEALS MILD/MODERATE  CAD WITH GOOD FLOW DOWN HER LAD  . ESOPHAGOGASTRODUODENOSCOPY    . EYE SURGERY     bilateral cataract surgery with lens implant  . LEFT HEART CATH AND CORS/GRAFTS ANGIOGRAPHY N/A 12/14/2016   Procedure: LEFT HEART CATH AND CORS/GRAFTS ANGIOGRAPHY;  Surgeon: Jettie Booze, MD;  Location: [facility];  Service: Cardiovascular;  Laterality: N/A;  . lense removal Left   . POLYPECTOMY    . ROTATOR CUFF REPAIR     right and left  . TONSILLECTOMY     age 5  . 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 tumor removed kidney    . UPPER GASTROINTESTINAL ENDOSCOPY    . US ECHOCARDIOGRAPHY  03/08/2008   EF 55-60%    There were no vitals filed for this visit.  Subjective Assessment - 03/16/18 1454    Subjective  "I'm about middle of the road".  Had another headache yesterday.  Asthma is really bad today - had to use inhaler due to weezing.  Pertinent History  CABG x 3, OSA, history of smoking (quit 30 y/ago), asthma, diabetic polyneuropathy, vertigo, DM, COPD, HTN, CHF, cervicogenic headaches    Patient Stated Goals  "Hopefully, be able to improve balance."    Currently in Pain?  No/denies                        Vestibular Treatment/Exercise - 03/16/18 1504      Vestibular Treatment/Exercise   Vestibular Treatment Provided  Gaze    Gaze Exercises  X1 Viewing Horizontal;X1 Viewing Vertical      X1 Viewing Horizontal   Foot Position  feet together, no UE support and then with UE support to allow for faster head movement    Reps  3    Comments  60 seconds, moderate symptoms      X1 Viewing Vertical   Foot Position  feet together, no UE support and then with UE support to allow for faster head movement    Reps  3    Comments  60 seconds,  moderate symptoms         Balance Exercises - 03/16/18 1518      Balance Exercises: Standing   Tandem Stance  Eyes open;Eyes closed;Other reps (comment);30 secs   10 reps head nods/turns with EO; hold with EC   Other Standing Exercises  for tandem stand in corner, performed with both R and L foot forwards, supervision-min A        PT Education - 03/16/18 1605    Education Details  updated HEP    Person(s) Educated  Patient    Methods  Explanation;Demonstration;Handout    Comprehension  Verbalized understanding;Returned demonstration       PT Short Term Goals - 01/18/18 1639      PT SHORT TERM GOAL #1   Title  = LTG        PT Long Term Goals - 02/20/18 0815      PT LONG TERM GOAL #1   Title  patient to be independent with advanced HEP for LE strength and balance    Time  4    Period  Weeks    Status  Revised    Target Date  04/06/18      PT LONG TERM GOAL #2   Title  Pt will report being able to ambulate >4 laps around [facility] 2-3x/week with minimal symptoms    Baseline  Still doing 3 laps - may do 4 on Thursday    Time  4    Period  Weeks    Status  Revised    Target Date  04/06/18      PT LONG TERM GOAL #3   Title  patient to improve FGA to >/=23/30 demonstrating reduced fall risk    Baseline  21/30 > decreased to 16/30 but pt reporting increased fatigue today and increased dizziness day before    Time  4    Period  Weeks    Status  Revised    Target Date  04/06/18      PT LONG TERM GOAL #5   Title  patient to improve SOT composite score to within age related norms    Baseline  29    Time  4    Period  Weeks    Status  Revised    Target Date  04/06/18            Plan - 03/16/18 1608  Clinical Impression Statement  Treatment session today focused on review and progression of patient's current HEP to continue to challenge and improve use of vestibular system.  Pt tolerated well but continues to report moderate dizziness with x1 viewing  with faster head movements and increased difficulty maintaining balance with vision removed.  Will continue to address and progress towards LTG.    Rehab Potential  Good    PT Frequency  1x / week    PT Duration  4 weeks    PT Treatment/Interventions  ADLs/Self Care Home Management;Canalith Repostioning;Therapeutic exercise;Therapeutic activities;Functional mobility training;Stair training;Gait training;Balance training;Neuromuscular re-education;Patient/family education;Manual techniques;Vestibular;Passive range of motion;Visual/perceptual remediation/compensation    PT Next Visit Plan  Continue to progress x 1 viewing by making stance staggered or on compliant pillows or increase speed of head movement.  Continue to work on balance with eyes closed, backwards walking, stepping over objects, tandem gait/stand.  head turns/nods on ramp    PT Home Exercise Plan  Access Code: [email or web address]    Consulted and Agree with Plan of Care  Patient       Patient will benefit from skilled therapeutic intervention in order to improve the following deficits and impairments:  Decreased balance, Dizziness, Abnormal gait, Decreased strength, Difficulty walking, Decreased activity tolerance  Visit Diagnosis: Unsteadiness on feet  Other abnormalities of gait and mobility  Dizziness and giddiness     Problem List Patient Active Problem List   Diagnosis Date Noted  . Arthralgia 08/01/2017  . COPD with asthma ([place]) 05/04/2017  . Angina pectoris ([place]) 01/05/2017  . Insomnia 11/23/2016  . Chronic nonintractable headache 11/23/2016  . Pancreatic insufficiency 10/28/2016  . Carotid artery disease ([place]) 05/19/2016  . Fatigue 05/19/2016  . Anemia 03/27/2016  . Type 1 diabetes mellitus ([place]) 07/21/2015  . B12 deficiency 03/31/2015  . OSA (obstructive sleep apnea) 11/20/2014  . Fecal incontinence 08/28/2014  . 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 Falls 08/28/2014  . Neck pain of over 3 months duration  08/28/2014  . H/O total knee replacement 02/20/2014  . Obesity (BMI 30-39.9) 11/12/2013  . Chronic diastolic CHF (congestive heart failure) ([place]) 11/17/2012  . Meniscus, lateral, anterior horn derangement 11/10/2011  . Medial meniscus, posterior horn derangement 11/10/2011  . Osteoarthritis of right knee 11/10/2011  . Vertigo 10/05/2011  . HTN (hypertension), benign 10/05/2011  . Coronary artery disease 08/27/2010  . MUSCLE CRAMPS 12/29/2009  . History of myocardial infarction 11/19/2009  . Extrinsic asthma 11/19/2009  . GERD 11/19/2009  . Cough 11/19/2009  . Hyperlipidemia 11/18/2009    Rico Junker, PT, DPT 03/16/18    4:11 PM    [facility] [address] [place], [place], [address] Phone: [phone]   Fax:  [phone]  Name: [name] MRN: [id number] Date of Birth: 1940/09/10

## 2018-03-16 NOTE — Patient Instructions (Signed)
Access Code: [email or web address]  URL: [email or web address]  Date: 03/16/2018  Prepared by: Misty Stanley   Exercises  Standing Gaze Stabilization with Head Rotation - 2 sets - 60 SECONDS hold - 2-3x daily - 7x weekly  Standing Gaze Stabilization with Head Nod - 2 sets - 60 second hold - 2-3x daily - 7x weekly  Walking with Eyes Closed and Counter Support - 10 reps - 4 sets - 1x daily - 7x weekly  Half Tandem Stance Balance with Head Rotation - 10 reps - 2 sets - 1x daily - 7x weekly  Half Tandem Stance Balance with Head Nods - 10 reps - 2 sets - 1x daily - 7x weekly  Tandem Stance with Eyes Closed in Corner - 2 sets - 30 SECOND hold - 1x daily - 7x weekly

## 2018-03-17 ENCOUNTER — Telehealth: Payer: Self-pay | Admitting: Pulmonary Disease

## 2018-03-17 NOTE — Telephone Encounter (Signed)
Attempted to call pt. Mail box is full.  

## 2018-03-17 NOTE — Telephone Encounter (Signed)
Pt is calling back [phone]

## 2018-03-17 NOTE — Telephone Encounter (Signed)
Attempted to call Patient.  Left message to call back. 

## 2018-03-20 MED ORDER — FLUTICASONE-SALMETEROL 250-50 MCG/DOSE IN AEPB: 1.0000 | INHALATION_SPRAY | Freq: Two times a day (BID) | RESPIRATORY_TRACT | 5 refills | Status: DC

## 2018-03-20 NOTE — Telephone Encounter (Signed)
Eliezer Lofts from [place] [facility] want's to let BQ know that the Advair would be $7.00 for a 30 day supply. [phone] Ext# [phone]

## 2018-03-20 NOTE — Telephone Encounter (Signed)
Spoke with pt. She is aware that we received a call from her insurance company. Advair will only cost her $7 a month, pt states that she can afford this.  Beth - please advise as BQ is not available. Thanks.

## 2018-03-20 NOTE — Telephone Encounter (Signed)
Spoke with pt. She is aware of this medication change. Rx has been sent in. Symbicort has been removed from the medication list. Nothing further was needed.

## 2018-03-20 NOTE — Telephone Encounter (Signed)
That's fine. Advair 212mcg/50mcg, 1 puff twice daily. Please remove Symbicort from patients medication list.

## 2018-03-20 NOTE — Telephone Encounter (Signed)
Spoke with pt. States that Symbicort is too expensive for her. I have advised her to get a copy of her drug formulary to [place] so that we can pick a different inhaler. Pt agreed and verbalized understanding.

## 2018-03-20 NOTE — Telephone Encounter (Signed)
Pt is calling back [phone]

## 2018-03-20 NOTE — Telephone Encounter (Signed)
Called and left message for Patient to call back. 

## 2018-03-23 ENCOUNTER — Ambulatory Visit: Payer: Medicare Other | Admitting: Physical Therapy

## 2018-03-23 ENCOUNTER — Other Ambulatory Visit: Payer: Self-pay | Admitting: Internal Medicine

## 2018-03-23 ENCOUNTER — Encounter: Payer: Self-pay | Admitting: Physical Therapy

## 2018-03-23 DIAGNOSIS — R2681 Unsteadiness on feet: Secondary | ICD-10-CM

## 2018-03-23 DIAGNOSIS — R2689 Other abnormalities of gait and mobility: Secondary | ICD-10-CM

## 2018-03-23 DIAGNOSIS — M6281 Muscle weakness (generalized): Secondary | ICD-10-CM

## 2018-03-23 DIAGNOSIS — R293 Abnormal posture: Secondary | ICD-10-CM

## 2018-03-23 DIAGNOSIS — R42 Dizziness and giddiness: Secondary | ICD-10-CM

## 2018-03-23 NOTE — Therapy (Signed)
[facility] [address] [place], [place], [address] Phone: [phone]   Fax:  [phone]  Physical Therapy Treatment  Patient Details  Name: [name] MRN: [id number] Date of Birth: 1940-09-19 Referring Provider (PT): Metta Clines, DO   Encounter Date: 03/23/2018  PT End of Session - 03/23/18 1630    Visit Number  19    Number of Visits  21    Date for PT Re-Evaluation  04/06/18    Authorization Type  [employer] Medicare - 10th visit PN    PT Start Time  1535    PT Stop Time  1620    PT Time Calculation (min)  45 min    Activity Tolerance  Patient tolerated treatment well    Behavior During Therapy  [facility] for tasks assessed/performed       Past Medical History:  Diagnosis Date  . Adenomatous colon polyp   . Allergy   . Anxiety   . Asthma   . Chest pain   . Chronic diastolic CHF (congestive heart failure) ([place])   . COPD (chronic obstructive pulmonary disease) ([place])    pt is unsure if has been officially diagnosed  . Coronary artery disease    CABG '09- cathed 12/09, 9/10, 6/11, 3/14 and 12/13/16- medical Rx  . Diabetes mellitus   . GERD (gastroesophageal reflux disease)   . Hiatal hernia   . Hyperlipidemia   . Hypertension   . Myocardial infarction ([place]) 2009  . PONV (postoperative nausea and vomiting)   . Schatzki's ring   . Shoulder injury    resolved after shoulder surgery  . Sleep apnea    not on cpap    Past Surgical History:  Procedure Laterality Date  . ABDOMINAL HYSTERECTOMY    . APPENDECTOMY     came out with Hysterectomy  . CARDIAC CATHETERIZATION  07/23/2009   EF 60%  . CARDIAC CATHETERIZATION  10/11/2008  . CARDIAC CATHETERIZATION  03/01/2007   EF 75-80%  . CARDIOVASCULAR STRESS TEST  11/15/2007   EF 60%  . COLONOSCOPY    . CORONARY ARTERY BYPASS GRAFT     SEVERELY DISEASED SAPHENOUS VEIN GRAFT TO THE RIGHT CORONARY ARTERY BUT WITH FAIRLY WELL PRESERVED FLOW TO THE DISTAL RIGHT CORONARY  ARTERY FROM THE NATIVE CIRCULATION-RESTART  CATH IN JUNE 2000, REVEALS MILD/MODERATE  CAD WITH GOOD FLOW DOWN HER LAD  . ESOPHAGOGASTRODUODENOSCOPY    . EYE SURGERY     bilateral cataract surgery with lens implant  . LEFT HEART CATH AND CORS/GRAFTS ANGIOGRAPHY N/A 12/14/2016   Procedure: LEFT HEART CATH AND CORS/GRAFTS ANGIOGRAPHY;  Surgeon: Jettie Booze, MD;  Location: [facility];  Service: Cardiovascular;  Laterality: N/A;  . lense removal Left   . POLYPECTOMY    . ROTATOR CUFF REPAIR     right and left  . TONSILLECTOMY     age 59  . 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 tumor removed kidney    . UPPER GASTROINTESTINAL ENDOSCOPY    . US ECHOCARDIOGRAPHY  03/08/2008   EF 55-60%    There were no vitals filed for this visit.  Subjective Assessment - 03/23/18 1538    Subjective  Doing okay today; no real changes.      Pertinent History  CABG x 3, OSA, history of smoking (quit 30 y/ago), asthma, diabetic  polyneuropathy, vertigo, DM, COPD, HTN, CHF, cervicogenic headaches    Patient Stated Goals  "Hopefully, be able to improve balance."    Currently in Pain?  No/denies                        Vestibular Treatment/Exercise - 03/23/18 1540      Vestibular Treatment/Exercise   Vestibular Treatment Provided  Gaze    Gaze Exercises  X1 Viewing Horizontal;X1 Viewing Vertical      X1 Viewing Horizontal   Foot Position  feet together on solid floor, feet apart on foam    Reps  2    Comments  60 seconds each, for compliant surface pt had to place one finger on back of chair.  Mild symptoms on solid floor, moderate on compliant      X1 Viewing Vertical   Foot Position  feet together on solid floor, feet apart on foam    Reps  2    Comments  60 seconds each, for compliant surface pt had to place one finger on back of  chair.  Mild symptoms on solid floor, moderate on compliant         Balance Exercises - 03/23/18 1626      Balance Exercises: Standing   Standing Eyes Opened  Narrow base of support (BOS);Head turns;Foam/compliant surface;Solid surface;Other reps (comment)   facing up/down ramp; blue foam mat added to ramp   Standing Eyes Closed  Narrow base of support (BOS);Foam/compliant surface;Solid surface;20 secs   facing up/down ramp; blue mat over top of mat   Other Standing Exercises  with blue mat over top of ramp performed 5 reps each walking forwards and backwards up/down ramp + mat without UE support and without looking down; min A progressing to supervision        PT Education - 03/23/18 1630    Education Details  plan for final HEP    Person(s) Educated  Patient    Methods  Explanation    Comprehension  Verbalized understanding       PT Short Term Goals - 01/18/18 1639      PT SHORT TERM GOAL #1   Title  = LTG        PT Long Term Goals - 02/20/18 0815      PT LONG TERM GOAL #1   Title  patient to be independent with advanced HEP for LE strength and balance    Time  4    Period  Weeks    Status  Revised    Target Date  04/06/18      PT LONG TERM GOAL #2   Title  Pt will report being able to ambulate >4 laps around track at [facility] 2-3x/week with minimal symptoms    Baseline  Still doing 3 laps - may do 4 on Thursday    Time  4    Period  Weeks    Status  Revised    Target Date  04/06/18      PT LONG TERM GOAL #3   Title  patient to improve FGA to >/=23/30 demonstrating reduced fall risk    Baseline  21/30 > decreased to 16/30 but pt reporting increased fatigue today and increased dizziness day before    Time  4    Period  Weeks    Status  Revised    Target Date  04/06/18      PT LONG TERM GOAL #5   Title  patient to improve SOT composite score to within age related norms    Baseline  29    Time  4    Period  Weeks    Status  Revised    Target Date  04/06/18             Plan - 03/23/18 1630    Clinical Impression Statement  Treatment session continued to focus on progression of gaze adaptation adding compliant surface and continued balance training on uneven inclined and compliant surfaces to continue to challenge vestibular system.  Pt reports she practices eyes closed a lot at home to try and overcome her fear of not being able to see; pt reports at one time she lost her vision for 7 days due to diabetes and not being able to see causes her a lot of anxiety.  Will continue to address as pt is able to tolerate and will begin to review final HEP and check LTG next two sessions.      Rehab Potential  Good    PT Frequency  1x / week    PT Duration  4 weeks    PT Treatment/Interventions  ADLs/Self Care Home Management;Canalith Repostioning;Therapeutic exercise;Therapeutic activities;Functional mobility training;Stair training;Gait training;Balance training;Neuromuscular re-education;Patient/family education;Manual techniques;Vestibular;Passive range of motion;Visual/perceptual remediation/compensation    PT Next Visit Plan  10th visit PN - send to me.   final two sessions: check LTG and finalize HEP.  Send to me for D/C    PT Home Exercise Plan  Access Code: [email or web address]    Consulted and Agree with Plan of Care  Patient       Patient will benefit from skilled therapeutic intervention in order to improve the following deficits and impairments:  Decreased balance, Dizziness, Abnormal gait, Decreased strength, Difficulty walking, Decreased activity tolerance  Visit Diagnosis: Unsteadiness on feet  Other abnormalities of gait and mobility  Dizziness and giddiness  Muscle weakness (generalized)  Abnormal posture     Problem List Patient Active Problem List   Diagnosis Date Noted  . Arthralgia 08/01/2017  . COPD with asthma ([place]) 05/04/2017  . Angina pectoris ([place]) 01/05/2017  . Insomnia 11/23/2016  . Chronic nonintractable headache  11/23/2016  . Pancreatic insufficiency 10/28/2016  . Carotid artery disease ([place]) 05/19/2016  . Fatigue 05/19/2016  . Anemia 03/27/2016  . Type 1 diabetes mellitus ([place]) 07/21/2015  . B12 deficiency 03/31/2015  . OSA (obstructive sleep apnea) 11/20/2014  . Fecal incontinence 08/28/2014  . 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 Falls 08/28/2014  . Neck pain of over 3 months duration 08/28/2014  . H/O total knee replacement 02/20/2014  . Obesity (BMI 30-39.9) 11/12/2013  . Chronic diastolic CHF (congestive heart failure) ([place]) 11/17/2012  . Meniscus, lateral, anterior horn derangement 11/10/2011  . Medial meniscus, posterior horn derangement 11/10/2011  . Osteoarthritis of right knee 11/10/2011  . Vertigo 10/05/2011  . HTN (hypertension), benign 10/05/2011  . Coronary artery disease 08/27/2010  . MUSCLE CRAMPS 12/29/2009  . History of myocardial infarction 11/19/2009  . Extrinsic asthma 11/19/2009  . GERD 11/19/2009  . Cough 11/19/2009  . Hyperlipidemia 11/18/2009    Rico Junker, PT, DPT 03/23/18    4:38 PM    [facility] [address] [place], [place], [address] Phone: [phone]   Fax:  [phone]  Name: [name] MRN: [id number] Date of Birth: 03/15/1940

## 2018-03-27 ENCOUNTER — Encounter: Payer: Self-pay | Admitting: Neurology

## 2018-03-30 ENCOUNTER — Ambulatory Visit: Payer: Medicare Other | Admitting: Physical Therapy

## 2018-03-30 ENCOUNTER — Encounter: Payer: Self-pay | Admitting: Physical Therapy

## 2018-03-30 DIAGNOSIS — R2689 Other abnormalities of gait and mobility: Secondary | ICD-10-CM

## 2018-03-30 DIAGNOSIS — R2681 Unsteadiness on feet: Secondary | ICD-10-CM | POA: Diagnosis not present

## 2018-03-30 DIAGNOSIS — M6281 Muscle weakness (generalized): Secondary | ICD-10-CM

## 2018-03-30 DIAGNOSIS — R42 Dizziness and giddiness: Secondary | ICD-10-CM

## 2018-03-30 NOTE — Therapy (Addendum)
[facility] [address] [place], [place], [address] Phone: [phone]   Fax:  [phone]  Physical Therapy Treatment and 10th visit Progress Note  Patient Details  Name: [name] MRN: [id number] Date of Birth: 1940-11-07 Referring Provider (PT): Metta Clines, DO   Encounter Date: 03/30/2018  PT End of Session - 03/30/18 1447    Visit Number  20    Number of Visits  21    Date for PT Re-Evaluation  04/06/18    Authorization Type  [employer] Medicare - 10th visit PN    PT Start Time  6381    PT Stop Time  1519   left early due to nervous about weather   PT Time Calculation (min)  34 min    Activity Tolerance  Patient tolerated treatment well    Behavior During Therapy  [facility] for tasks assessed/performed       Past Medical History:  Diagnosis Date  . Adenomatous colon polyp   . Allergy   . Anxiety   . Asthma   . Chest pain   . Chronic diastolic CHF (congestive heart failure) ([place])   . COPD (chronic obstructive pulmonary disease) ([place])    pt is unsure if has been officially diagnosed  . Coronary artery disease    CABG '09- cathed 12/09, 9/10, 6/11, 3/14 and 12/13/16- medical Rx  . Diabetes mellitus   . GERD (gastroesophageal reflux disease)   . Hiatal hernia   . Hyperlipidemia   . Hypertension   . Myocardial infarction ([place]) 2009  . PONV (postoperative nausea and vomiting)   . Schatzki's ring   . Shoulder injury    resolved after shoulder surgery  . Sleep apnea    not on cpap    Past Surgical History:  Procedure Laterality Date  . ABDOMINAL HYSTERECTOMY    . APPENDECTOMY     came out with Hysterectomy  . CARDIAC CATHETERIZATION  07/23/2009   EF 60%  . CARDIAC CATHETERIZATION  10/11/2008  . CARDIAC CATHETERIZATION  03/01/2007   EF 75-80%  . CARDIOVASCULAR STRESS TEST  11/15/2007   EF 60%  . COLONOSCOPY    . CORONARY ARTERY BYPASS GRAFT     SEVERELY DISEASED SAPHENOUS VEIN GRAFT TO THE RIGHT CORONARY  ARTERY BUT WITH FAIRLY WELL PRESERVED FLOW TO THE DISTAL RIGHT CORONARY ARTERY FROM THE NATIVE CIRCULATION-RESTART  CATH IN JUNE 2000, REVEALS MILD/MODERATE  CAD WITH GOOD FLOW DOWN HER LAD  . ESOPHAGOGASTRODUODENOSCOPY    . EYE SURGERY     bilateral cataract surgery with lens implant  . LEFT HEART CATH AND CORS/GRAFTS ANGIOGRAPHY N/A 12/14/2016   Procedure: LEFT HEART CATH AND CORS/GRAFTS ANGIOGRAPHY;  Surgeon: Jettie Booze, MD;  Location: [facility];  Service: Cardiovascular;  Laterality: N/A;  . lense removal Left   . POLYPECTOMY    . ROTATOR CUFF REPAIR     right and left  . TONSILLECTOMY     age 78  . 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 tumor removed kidney    . UPPER GASTROINTESTINAL ENDOSCOPY    . US ECHOCARDIOGRAPHY  03/08/2008   EF 55-60%    There were no vitals filed for this visit.  Subjective Assessment - 03/30/18 1447    Subjective  No new complatins. No falls to report, does still have some stumbles. No  pain today.     Pertinent History  CABG x 3, OSA, history of smoking (quit 30 y/ago), asthma, diabetic polyneuropathy, vertigo, DM, COPD, HTN, CHF, cervicogenic headaches    Patient Stated Goals  "Hopefully, be able to improve balance."    Currently in Pain?  No/denies         [facility] PT Assessment - 03/30/18 1509      Functional Gait  Assessment   Gait assessed   Yes    Gait Level Surface  Walks 20 ft in less than 7 sec but greater than 5.5 sec, uses assistive device, slower speed, mild gait deviations, or deviates 6-10 in outside of the 12 in walkway width.   6.12 sec   Change in Gait Speed  Able to smoothly change walking speed without loss of balance or gait deviation. Deviate no more than 6 in outside of the 12 in walkway width.    Gait with Horizontal Head Turns  Performs head turns smoothly with no change  in gait. Deviates no more than 6 in outside 12 in walkway width    Gait with Vertical Head Turns  Performs head turns with no change in gait. Deviates no more than 6 in outside 12 in walkway width.    Gait and Pivot Turn  Pivot turns safely within 3 sec and stops quickly with no loss of balance.    Step Over Obstacle  Is able to step over 2 stacked shoe boxes taped together (9 in total height) without changing gait speed. No evidence of imbalance.    Gait with Narrow Base of Support  Ambulates less than 4 steps heel to toe or cannot perform without assistance.    Gait with Eyes Closed  Walks 20 ft, slow speed, abnormal gait pattern, evidence for imbalance, deviates 10-15 in outside 12 in walkway width. Requires more than 9 sec to ambulate 20 ft.    Ambulating Backwards  Walks 20 ft, uses assistive device, slower speed, mild gait deviations, deviates 6-10 in outside 12 in walkway width.    Steps  Alternating feet, must use rail.    Total Score  22    FGA comment:  22/30= medium       Treatment:   Neuro re-ed: sensory organization test performed with following results: Conditions: 1: all 3 above norm 2: all 3 above norm 3: all 3 below norm 4: below, fall, below 5: above, below, below 6: all 3 falls Composite score: 42 Sensory Analysis Som: above norm Vis: below Vest: below Pref: below Strategy analysis: geared toward ankle dominant      COG alignment: posterior left lateral side dominant              PT Short Term Goals - 01/18/18 1639      PT SHORT TERM GOAL #1   Title  = LTG        PT Long Term Goals - 03/30/18 1507      PT LONG TERM GOAL #1   Title  patient to be independent with advanced HEP for LE strength and balance    Time  4    Period  Weeks    Status  On-going      PT LONG TERM GOAL #2   Title  Pt will report being able to ambulate >4 laps around track at [facility] 2-3x/week with minimal symptoms    Baseline  Still doing 3 laps - may do 4 on Thursday    Time   4  Period  Weeks    Status  On-going      PT LONG TERM GOAL #3   Title  patient to improve FGA to >/=23/30 demonstrating reduced fall risk    Baseline  22/30 improved from 16/30 but not to goal    Time  4    Period  Weeks    Status  Partially Met      PT LONG TERM GOAL #5   Title  patient to improve SOT composite score to within age related norms    Baseline  03/30/18: improved form 29 to 28, age related norm is ~60    Status  Partially Met            Plan - 03/30/18 1448    Clinical Impression Statement  Today's skilled session focused on progress toward LTGs with pt showing improved in her Composite score on the SOT and an improved score on the Functional Gait Assessment. Limited session today as pt wanted to leave early due to snowy weather outside. Will plan to check remaining LTGs at next session.     Rehab Potential  Good    PT Frequency  1x / week    PT Duration  4 weeks    PT Treatment/Interventions  ADLs/Self Care Home Management;Canalith Repostioning;Therapeutic exercise;Therapeutic activities;Functional mobility training;Stair training;Gait training;Balance training;Neuromuscular re-education;Patient/family education;Manual techniques;Vestibular;Passive range of motion;Visual/perceptual remediation/compensation    PT Next Visit Plan  check remaining LTGs and finalize HEP.  Send to me for D/C    PT Home Exercise Plan  Access Code: [email or web address]    Consulted and Agree with Plan of Care  Patient       Patient will benefit from skilled therapeutic intervention in order to improve the following deficits and impairments:  Decreased balance, Dizziness, Abnormal gait, Decreased strength, Difficulty walking, Decreased activity tolerance  Visit Diagnosis: Unsteadiness on feet  Other abnormalities of gait and mobility  Dizziness and giddiness  Muscle weakness (generalized)     Problem List Patient Active Problem List   Diagnosis Date Noted  . Arthralgia 08/01/2017  .  COPD with asthma ([place]) 05/04/2017  . Angina pectoris ([place]) 01/05/2017  . Insomnia 11/23/2016  . Chronic nonintractable headache 11/23/2016  . Pancreatic insufficiency 10/28/2016  . Carotid artery disease ([place]) 05/19/2016  . Fatigue 05/19/2016  . Anemia 03/27/2016  . Type 1 diabetes mellitus ([place]) 07/21/2015  . B12 deficiency 03/31/2015  . OSA (obstructive sleep apnea) 11/20/2014  . Fecal incontinence 08/28/2014  . 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 Falls 08/28/2014  . Neck pain of over 3 months duration 08/28/2014  . H/O total knee replacement 02/20/2014  . Obesity (BMI 30-39.9) 11/12/2013  . Chronic diastolic CHF (congestive heart failure) ([place]) 11/17/2012  . Meniscus, lateral, anterior horn derangement 11/10/2011  . Medial meniscus, posterior horn derangement 11/10/2011  . Osteoarthritis of right knee 11/10/2011  . Vertigo 10/05/2011  . HTN (hypertension), benign 10/05/2011  . Coronary artery disease 08/27/2010  . MUSCLE CRAMPS 12/29/2009  . History of myocardial infarction 11/19/2009  . Extrinsic asthma 11/19/2009  . GERD 11/19/2009  . Cough 11/19/2009  . Hyperlipidemia 11/18/2009    Willow Ora, PTA, [facility] [address] [place], [place] [address] [phone] 03/31/18, 1:10 PM    10th Visit Physical Therapy Progress Note  Dates of Reporting Period: 12/28/2017 to 03/30/2018  Objective Reports: See above  Objective Measurements: See above  Goal Update: 2 goals partially met, 2 goals still ongoing  Plan: Complete assessment of  LTG and finalize HEP; plan to D/C after next session  Reason Skilled Services are Required: To continue to address unmet goals and ensure safe and independent performance of HEP.  Rico Junker, PT, DPT 04/02/18    6:10 PM     Name: [name] MRN: [id number] Date of Birth: Sep 19, 1940

## 2018-04-04 ENCOUNTER — Ambulatory Visit: Payer: Medicare Other | Admitting: Physical Therapy

## 2018-04-07 ENCOUNTER — Encounter: Payer: Self-pay | Admitting: Physical Therapy

## 2018-04-07 ENCOUNTER — Ambulatory Visit: Payer: Medicare Other | Admitting: Physical Therapy

## 2018-04-07 DIAGNOSIS — R2681 Unsteadiness on feet: Secondary | ICD-10-CM

## 2018-04-07 DIAGNOSIS — R42 Dizziness and giddiness: Secondary | ICD-10-CM

## 2018-04-07 DIAGNOSIS — R2689 Other abnormalities of gait and mobility: Secondary | ICD-10-CM

## 2018-04-07 DIAGNOSIS — M6281 Muscle weakness (generalized): Secondary | ICD-10-CM

## 2018-04-07 DIAGNOSIS — R293 Abnormal posture: Secondary | ICD-10-CM

## 2018-04-07 NOTE — Patient Instructions (Signed)
Access Code: [email or web address]  URL: [email or web address]  Date: 04/07/2018  Prepared by: Misty Stanley   Exercises  Standing Gaze Stabilization with Head Rotation - 2 sets - 60 SECONDS hold - 2-3x daily - 7x weekly  Standing Gaze Stabilization with Head Nod - 2 sets - 60 second hold - 2-3x daily - 7x weekly  Walking with Eyes Closed and Counter Support - 10 reps - 4 sets - 1x daily - 7x weekly  Half Tandem Stance Balance with Head Rotation - 10 reps - 2 sets - 1x daily - 7x weekly  Half Tandem Stance Balance with Head Nods - 10 reps - 2 sets - 1x daily - 7x weekly  Romberg Stance with Eyes Closed - 10 reps - 2 sets - 1x daily - 7x weekly

## 2018-04-07 NOTE — Therapy (Signed)
[facility] [address] [place], [place], [address] Phone: [phone]   Fax:  [phone]  Physical Therapy Treatment and D/C Summary  Patient Details  Name: [name] MRN: [id number] Date of Birth: 1940/07/13 Referring Provider (PT): Metta Clines, DO   Encounter Date: 04/07/2018  PT End of Session - 04/07/18 0925    Visit Number  21    Number of Visits  21    Date for PT Re-Evaluation  04/06/18    Authorization Type  [employer] Medicare - 10th visit PN    PT Start Time  0848    PT Stop Time  0930    PT Time Calculation (min)  42 min    Activity Tolerance  Patient tolerated treatment well    Behavior During Therapy  [facility] for tasks assessed/performed       Past Medical History:  Diagnosis Date  . Adenomatous colon polyp   . Allergy   . Anxiety   . Asthma   . Chest pain   . Chronic diastolic CHF (congestive heart failure) ([place])   . COPD (chronic obstructive pulmonary disease) ([place])    pt is unsure if has been officially diagnosed  . Coronary artery disease    CABG '09- cathed 12/09, 9/10, 6/11, 3/14 and 12/13/16- medical Rx  . Diabetes mellitus   . GERD (gastroesophageal reflux disease)   . Hiatal hernia   . Hyperlipidemia   . Hypertension   . Myocardial infarction ([place]) 2009  . PONV (postoperative nausea and vomiting)   . Schatzki's ring   . Shoulder injury    resolved after shoulder surgery  . Sleep apnea    not on cpap    Past Surgical History:  Procedure Laterality Date  . ABDOMINAL HYSTERECTOMY    . APPENDECTOMY     came out with Hysterectomy  . CARDIAC CATHETERIZATION  07/23/2009   EF 60%  . CARDIAC CATHETERIZATION  10/11/2008  . CARDIAC CATHETERIZATION  03/01/2007   EF 75-80%  . CARDIOVASCULAR STRESS TEST  11/15/2007   EF 60%  . COLONOSCOPY    . CORONARY ARTERY BYPASS GRAFT     SEVERELY DISEASED SAPHENOUS VEIN GRAFT TO THE RIGHT CORONARY ARTERY BUT WITH FAIRLY WELL PRESERVED FLOW TO THE DISTAL  RIGHT CORONARY ARTERY FROM THE NATIVE CIRCULATION-RESTART  CATH IN JUNE 2000, REVEALS MILD/MODERATE  CAD WITH GOOD FLOW DOWN HER LAD  . ESOPHAGOGASTRODUODENOSCOPY    . EYE SURGERY     bilateral cataract surgery with lens implant  . LEFT HEART CATH AND CORS/GRAFTS ANGIOGRAPHY N/A 12/14/2016   Procedure: LEFT HEART CATH AND CORS/GRAFTS ANGIOGRAPHY;  Surgeon: Jettie Booze, MD;  Location: [facility];  Service: Cardiovascular;  Laterality: N/A;  . lense removal Left   . POLYPECTOMY    . ROTATOR CUFF REPAIR     right and left  . TONSILLECTOMY     age 7  . 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 tumor removed kidney    . UPPER GASTROINTESTINAL ENDOSCOPY    . US ECHOCARDIOGRAPHY  03/08/2008   EF 55-60%    There were no vitals filed for this visit.  Subjective Assessment - 04/07/18 0853    Subjective  "Today is my last day."  No issues to report.    Pertinent History  CABG x 3, OSA, history of smoking (  quit 30 y/ago), asthma, diabetic polyneuropathy, vertigo, DM, COPD, HTN, CHF, cervicogenic headaches    Patient Stated Goals  "Hopefully, be able to improve balance."    Currently in Pain?  No/denies         04/07/18 1047  Vestibular Treatment/Exercise  Vestibular Treatment Provided Gaze  Gaze Exercises X1 Viewing Horizontal;X1 Viewing Vertical  X1 Viewing Horizontal  Foot Position feet apart focusing on increasing speed of head turns  Reps 2  Comments 60 seconds  X1 Viewing Vertical  Foot Position feet apart focusing on increasing speed of head turns  Reps 2  Comments 60 seconds      Access Code: [email or web address]  URL: [email or web address]  Date: 04/07/2018  Prepared by: Misty Stanley   Exercises  Standing Gaze Stabilization with Head Rotation - 2 sets - 60 SECONDS hold - 2-3x daily - 7x weekly  Standing Gaze  Stabilization with Head Nod - 2 sets - 60 second hold - 2-3x daily - 7x weekly  Walking with Eyes Closed and Counter Support - 10 reps - 4 sets - 1x daily - 7x weekly  Half Tandem Stance Balance with Head Rotation - 10 reps - 2 sets - 1x daily - 7x weekly  Half Tandem Stance Balance with Head Nods - 10 reps - 2 sets - 1x daily - 7x weekly  Romberg Stance with Eyes Closed - 10 reps - 2 sets - 1x daily - 7x weekly    [facility] PT Assessment - 04/07/18 1046      Assessment   Medical Diagnosis  Unsteady gait    Referring Provider (PT)  Metta Clines, DO    Onset Date/Surgical Date  07/20/17    Hand Dominance  Right    Prior Therapy  no      Precautions   Precautions  Fall      Prior Function   Level of Independence  Independent      Observation/Other Assessments   Focus on Therapeutic Outcomes (FOTO)   not assessed              PT Education - 04/07/18 0925    Education Details  progress towards goals, final HEP    Person(s) Educated  Patient    Methods  Explanation;Demonstration;Handout    Comprehension  Verbalized understanding;Returned demonstration       PT Short Term Goals - 01/18/18 1639      PT SHORT TERM GOAL #1   Title  = LTG        PT Long Term Goals - 04/07/18 0854      PT LONG TERM GOAL #1   Title  patient to be independent with advanced HEP for LE strength and balance    Time  4    Period  Weeks    Status  Achieved      PT LONG TERM GOAL #2   Title  Pt will report being able to ambulate >4 laps around track at [facility] 2-3x/week with minimal symptoms    Baseline  walking 5-6 laps at [facility]    Time  4    Period  Weeks    Status  Achieved      PT LONG TERM GOAL #3   Title  patient to improve FGA to >/=23/30 demonstrating reduced fall risk    Baseline  22/30 improved from 16/30 but not to goal    Time  4    Period  Weeks    Status  Partially Met  PT LONG TERM GOAL #5   Title  patient to improve SOT composite score to within age related norms     Baseline  03/30/18: improved form 29 to 36, age related norm is ~60    Status  Partially Met            Plan - 04/07/18 1044    Clinical Impression Statement  Treatment session focused on assessment of remainder LTG.  Pt has made steady progress and has met 2/4 LTG, partially meeting and making progress towards other two goals.  Pt demonstrated independence with HEP today to continue to address vestibular and balance impairments.  Pt agreeable to D/C today.    Rehab Potential  Good    PT Frequency  1x / week    PT Duration  4 weeks    PT Treatment/Interventions  ADLs/Self Care Home Management;Canalith Repostioning;Therapeutic exercise;Therapeutic activities;Functional mobility training;Stair training;Gait training;Balance training;Neuromuscular re-education;Patient/family education;Manual techniques;Vestibular;Passive range of motion;Visual/perceptual remediation/compensation    PT Home Exercise Plan  Access Code: [email or web address]    Consulted and Agree with Plan of Care  Patient       Patient will benefit from skilled therapeutic intervention in order to improve the following deficits and impairments:  Decreased balance, Dizziness, Abnormal gait, Decreased strength, Difficulty walking, Decreased activity tolerance  Visit Diagnosis: Unsteadiness on feet  Other abnormalities of gait and mobility  Dizziness and giddiness  Muscle weakness (generalized)  Abnormal posture     Problem List Patient Active Problem List   Diagnosis Date Noted  . Arthralgia 08/01/2017  . COPD with asthma ([place]) 05/04/2017  . Angina pectoris ([place]) 01/05/2017  . Insomnia 11/23/2016  . Chronic nonintractable headache 11/23/2016  . Pancreatic insufficiency 10/28/2016  . Carotid artery disease ([place]) 05/19/2016  . Fatigue 05/19/2016  . Anemia 03/27/2016  . Type 1 diabetes mellitus ([place]) 07/21/2015  . B12 deficiency 03/31/2015  . OSA (obstructive sleep apnea) 11/20/2014  . Fecal incontinence 08/28/2014  .  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 Falls 08/28/2014  . Neck pain of over 3 months duration 08/28/2014  . H/O total knee replacement 02/20/2014  . Obesity (BMI 30-39.9) 11/12/2013  . Chronic diastolic CHF (congestive heart failure) ([place]) 11/17/2012  . Meniscus, lateral, anterior horn derangement 11/10/2011  . Medial meniscus, posterior horn derangement 11/10/2011  . Osteoarthritis of right knee 11/10/2011  . Vertigo 10/05/2011  . HTN (hypertension), benign 10/05/2011  . Coronary artery disease 08/27/2010  . MUSCLE CRAMPS 12/29/2009  . History of myocardial infarction 11/19/2009  . Extrinsic asthma 11/19/2009  . GERD 11/19/2009  . Cough 11/19/2009  . Hyperlipidemia 11/18/2009   PHYSICAL THERAPY DISCHARGE SUMMARY  Visits from Start of Care: 21  Current functional level related to goals / functional outcomes: See impression statement and goal achievement above   Remaining deficits: Impaired balance   Education / Equipment: HEP  Plan: Patient agrees to discharge.  Patient goals were partially met. Patient is being discharged due to meeting the stated rehab goals.  ?????     Rico Junker, PT, DPT 04/07/18    10:48 AM    [facility] [address] [place], [place], [address] Phone: [phone]   Fax:  [phone]  Name: [name] MRN: [id number] Date of Birth: Apr 07, 1940

## 2018-04-13 ENCOUNTER — Encounter: Payer: Medicare Other | Admitting: Physical Therapy

## 2018-04-13 NOTE — Progress Notes (Signed)
NEUROLOGY FOLLOW UP OFFICE NOTE  DONICIA DRUCK [id number]  HISTORY OF PRESENT ILLNESS: [name] is a 78 year old woman with coronary artery disease, hypertension, type 2 diabetes mellitus, hyperlipidemia, B12 deficiency and obstructive sleep apnea who follows up for migraines.  UPDATE: Intensity:  10/10 Duration:  Wakes up at 5 AM with headache and resolves by 11 AM. Frequency:  daily Frequency of abortive medication: Tylenol every 4 hours daily Current NSAIDS:  ASA 36m Current analgesics:  Tylenol Current triptans:  no Current ergotamine:  no Current anti-emetic:  Promethazine 267mCurrent muscle relaxants:  Baclofen 2043mwice daily Current anti-anxiolytic:  Klonopin Current sleep aide:  trazodone Current Antihypertensive medications:  Toprol XL 69m77mardizem, Lasix, Imdur Current Antidepressant medications:  Nortriptyline 50 mg Current Anticonvulsant medications:  no Current anti-CGRP:  no Current Vitamins/Herbal/Supplements:  B12 Current Antihistamines/Decongestants:  Allegra, Singulair Other therapy:  no  Caffeine:  1 cup coffee daily, tea Alcohol:  no Smoker:  no Diet:  hydrates Exercise:  yes Depression:  no; Anxiety:  no Other pain:  no Sleep hygiene:  Poor.  Untreated OSA.  She is on O2 at night which helped the headaches.  Still with dizziness and unsteady gait.  Vestibular rehab helped but not resolved.  HISTORY:  Onset: She has history of migraines as a young woman which resolved after having children. Current headaches started in 2016. Location: She has constant holocephalic headache. She has severe headache radiating from the base of her neck on the right up Quality: pounding Initial Intensity: 10/10 Aura: no Prodrome: no Postdrome: no Associated symptoms: No nausea, vomiting, photophobia, phonophobia or visual disturbance. She has not had any new worse headache of her life, waking up from sleep Initial Duration: All day Initial  Frequency: Daily headache, severe Headache every other day Initial Frequency of abortive medication: daily Triggers: None Relieving factors: None Activity: aggravates  Past NSAIDS: Ibuprofen, naproxen (caused increased heart rate) Past analgesics: Excedrin, tramadol and other opioids (adverse reaction) Past abortive triptans: no Past muscle relaxants: Robaxin, Flexeril Past anti-emetic: Promethazine (effective) Past antihypertensive medications: losartan Past antidepressant medications: no Past anticonvulsant medications: Gabapentin 200mg54mowsiness) Past vitamins/Herbal/Supplements: CoQ10 Past antihistamines/decongestants: no Other past therapy: Physical therapy of neck.  Family history of headache: father  MRI of brain without contrast from 09/16/14 was personally reviewed and revealed partially empty sella but otherwise unremarkable.  MRI of cervical spine from 09/16/14 was personally reviewed and demonstrated multilevel degenerative changes including left greater than right spurring at C3-C4 causing left foraminal narrowing, severe spurring at C4-C5 causing right moderate foraminal narrowing, and disc bulg and spurring at C6-C7 with moderate bilateral foraminal narrowing. She was evaluated by Dr. RamosNelva Bushinterventional pain (epidural or facet joint injections). He didn't think it was a good idea given that she would have to stop Plavix.  PAST MEDICAL HISTORY: Past Medical History:  Diagnosis Date  . Adenomatous colon polyp   . Allergy   . Anxiety   . Asthma   . Chest pain   . Chronic diastolic CHF (congestive heart failure) ([facility]) [place] COPD (chronic obstructive pulmonary disease) ([facility]) [place] is unsure if has been officially diagnosed  . Coronary artery disease    CABG '09- cathed 12/09, 9/10, 6/11, 3/14 and 12/13/16- medical Rx  . Diabetes mellitus   . GERD (gastroesophageal reflux disease)   . Hiatal hernia   . Hyperlipidemia   . Hypertension   .  Myocardial infarction ([facility]) [place]9  . PONV (postoperative nausea  and vomiting)   . Schatzki's ring   . Shoulder injury    resolved after shoulder surgery  . Sleep apnea    not on cpap    MEDICATIONS: Current Outpatient Medications on File Prior to Visit  Medication Sig Dispense Refill  . albuterol (PROVENTIL HFA;VENTOLIN HFA) 108 (90 BASE) MCG/ACT inhaler Inhale 2 puffs into the lungs every 6 (six) hours as needed. For shortness of breath. 1 Inhaler 0  . albuterol (PROVENTIL) (2.5 MG/3ML) 0.083% nebulizer solution Take 3 mLs (2.5 mg total) by nebulization every 6 (six) hours as needed for wheezing or shortness of breath. 75 mL 12  . alum & mag hydroxide-simeth (MAALOX/MYLANTA) 200-200-20 MG/5ML suspension Take 30 mLs as needed by mouth for indigestion or heartburn.    Marland Kitchen aspirin EC 81 MG tablet Take 1 tablet (81 mg total) by mouth daily.    . baclofen (LIORESAL) 20 MG tablet TAKE 1 TABLET BY MOUTH THREE TIMES A DAY 90 tablet 2  . Blood Glucose Monitoring Suppl (ONE TOUCH ULTRA MINI) w/Device KIT 3 (three) times daily. for testing  0  . calcium carbonate (TUMS - DOSED IN MG ELEMENTAL CALCIUM) 500 MG chewable tablet Chew 2 tablets daily as needed by mouth for indigestion or heartburn.    . Cholecalciferol (VITAMIN D) 2000 UNITS tablet Take 2,000 Units by mouth daily.     . clonazePAM (KLONOPIN) 0.5 MG tablet Take 0.5 mg daily by mouth.    . CREON 24000-76000 units CPEP TAKE 2 CAPSULES BY MOUTH 3 TIMES A DAY WITH MEALS (TAK 1 CAPSULE WITH A SNACK) 210 capsule 1  . dapagliflozin propanediol (FARXIGA) 5 MG TABS tablet Take 5 mg by mouth daily. 30 tablet 5  . diclofenac sodium (VOLTAREN) 1 % GEL Apply 2 g topically 4 (four) times daily as needed (muscle pain).    Marland Kitchen diltiazem (CARDIZEM CD) 240 MG 24 hr capsule TAKE 1 CAPSULE BY MOUTH EVERY DAY 90 capsule 2  . ezetimibe (ZETIA) 10 MG tablet Take 10 mg by mouth daily.  6  . famotidine (PEPCID) 40 MG tablet Take 1 tablet (40 mg total) by mouth daily as  needed. 90 tablet 0  . fexofenadine (ALLEGRA) 180 MG tablet Take 1 tablet (180 mg total) by mouth daily. (Patient taking differently: Take 180 mg by mouth daily as needed. ) 30 tablet 6  . fluticasone (FLONASE) 50 MCG/ACT nasal spray Place 2 sprays into both nostrils daily. 16 g 6  . Fluticasone-Salmeterol (ADVAIR DISKUS) 250-50 MCG/DOSE AEPB Inhale 1 puff into the lungs 2 (two) times daily. 60 each 5  . Fluticasone-Umeclidin-Vilant (TRELEGY ELLIPTA) 100-62.5-25 MCG/INH AEPB Inhale 1 puff into the lungs daily. 1 each 0  . furosemide (LASIX) 40 MG tablet Take 1 tablet (40 mg total) by mouth daily. 90 tablet 3  . insulin lispro protamine-insulin lispro (HUMALOG 75/25) (75-25) 100 UNIT/ML SUSP Inject 15-22 Units into the skin See admin instructions. Takes 10 units in the morning and 20  units with supper    . isosorbide mononitrate (IMDUR) 60 MG 24 hr tablet Take 1.5 tablets (90 mg total) by mouth daily. 135 tablet 1  . loperamide (IMODIUM) 2 MG capsule Take 2 mg by mouth as needed for diarrhea or loose stools.     . meclizine (ANTIVERT) 25 MG tablet Take 25 mg by mouth 2 (two) times daily as needed for dizziness.     . metoprolol succinate (TOPROL-XL) 100 MG 24 hr tablet TAKE 1 TABLET BY MOUTH EVERY DAY 90 tablet  1  . montelukast (SINGULAIR) 5 MG chewable tablet Chew 1 tablet (5 mg total) by mouth at bedtime. 30 tablet 3  . Multiple Vitamin (MULTIVITAMIN WITH MINERALS) TABS Take 1 tablet by mouth daily.    . Nebulizers (COMPRESSOR/NEBULIZER) MISC Use with albuterol 1 each 0  . nitroGLYCERIN (NITROSTAT) 0.4 MG SL tablet Place 0.4 mg under the tongue every 5 (five) minutes as needed for chest pain.    . nortriptyline (PAMELOR) 50 MG capsule TAKE 1 CAPSULE (50 MG TOTAL) BY MOUTH AT BEDTIME. 90 capsule 2  . ONE TOUCH ULTRA TEST test strip CHECK BLOOD SUGAR TWICE A DAY DX: E11.9  3  . ONETOUCH DELICA LANCETS 57X MISC 3 (three) times daily. for testing  0  . pantoprazole (PROTONIX) 40 MG tablet Take 1  tablet (40 mg total) by mouth 2 (two) times daily before a meal. 180 tablet 3  . Spacer/Aero-Holding Chambers (AEROCHAMBER MV) inhaler Use as instructed 1 each 0   Current Facility-Administered Medications on File Prior to Visit  Medication Dose Route Frequency Provider Last Rate Last Dose  . cyanocobalamin ((VITAMIN B-12)) injection 1,000 mcg  1,000 mcg Intramuscular Q30 days Binnie Rail, MD   1,000 mcg at 03/26/16 1658    ALLERGIES: Allergies  Allergen Reactions  . Amlodipine Other (See Comments)    hallucinations   . Banana Nausea And Vomiting    Stomach pumped  . Co Q10 [Coenzyme Q10]     Body cramps  . Codeine     Hallucinate, loose identity and don't know who I am  . Metformin And Related Nausea And Vomiting  . Morphine Other (See Comments)    Can not function, it immobilizes me   . Pentazocine Nausea And Vomiting  . Pravastatin     Hands locked up  . Repatha [Evolocumab]     myalgias  . Statins     Muscle cramps  . Sulfa Antibiotics Swelling  . Sulfonamide Derivatives Swelling  . Tramadol Hcl     FAMILY HISTORY: Family History  Problem Relation Age of Onset  . Heart disease Maternal Grandfather   . Heart failure Maternal Grandfather   . Diabetes Maternal Grandfather   . Heart attack Father   . Diabetes Mother   . Rheum arthritis Sister   . Emphysema Paternal Uncle   . Esophageal cancer Brother 76       she said he was born with it  . Emphysema Paternal Aunt   . Neuropathy Neg Hx   . Multiple sclerosis Neg Hx   . Colon cancer Neg Hx   . Colon polyps Neg Hx   . Rectal cancer Neg Hx   . Stomach cancer Neg Hx     SOCIAL HISTORY: Social History   Socioeconomic History  . Marital status: Widowed    Spouse name: Not on file  . Number of children: 4  . Years of education: Designer, jewellery  . Highest education level: Not on file  Occupational History  . Occupation: Retired  Scientific laboratory technician  . Financial resource strain: Very hard  . Food insecurity:     Worry: Sometimes true    Inability: Sometimes true  . Transportation needs:    Medical: No    Non-medical: No  Tobacco Use  . Smoking status: Former Smoker    Packs/day: 0.50    Years: 21.00    Pack years: 10.50    Types: Cigarettes    Start date: 02/09/1955    Last attempt to quit: 02/09/1976  Years since quitting: 42.2  . Smokeless tobacco: Never Used  Substance and Sexual Activity  . Alcohol use: No    Alcohol/week: 0.0 standard drinks  . Drug use: No  . Sexual activity: Never  Lifestyle  . Physical activity:    Days per week: 3 days    Minutes per session: 40 min  . Stress: Only a little  Relationships  . Social connections:    Talks on phone: More than three times a week    Gets together: More than three times a week    Attends religious service: More than 4 times per year    Active member of club or organization: Yes    Attends meetings of clubs or organizations: More than 4 times per year    Relationship status: Widowed  . Intimate partner violence:    Fear of current or ex partner: Not on file    Emotionally abused: Not on file    Physically abused: Not on file    Forced sexual activity: Not on file  Other Topics Concern  . Not on file  Social History Narrative   Lives alone.  One story home.  Has 4 children.  Education: doctorate in theology.    Caffeine use: Drinks 1 cup coffee/day      Originally from [place]. Previously has lived in [place]. Prior travel to [place], [place], [place], [place], [place], [place], [place], & [place]. Previously worked in [employer] She has a dog currently. No bird, mold, or hot tub exposure. She also pastors a church.     REVIEW OF SYSTEMS: Constitutional: No fevers, chills, or sweats, no generalized fatigue, change in appetite Eyes: No visual changes, double vision, eye pain Ear, nose and throat: No hearing loss, ear pain, nasal congestion, sore throat Cardiovascular: No chest pain, palpitations Respiratory:  No shortness of breath at  rest or with exertion, wheezes GastrointestinaI: No nausea, vomiting, diarrhea, abdominal pain, fecal incontinence Genitourinary:  No dysuria, urinary retention or frequency Musculoskeletal:  No neck pain, back pain Integumentary: No rash, pruritus, skin lesions Neurological: as above Psychiatric: No depression, insomnia, anxiety Endocrine: No palpitations, fatigue, diaphoresis, mood swings, change in appetite, change in weight, increased thirst Hematologic/Lymphatic:  No purpura, petechiae. Allergic/Immunologic: no itchy/runny eyes, nasal congestion, recent allergic reactions, rashes  PHYSICAL EXAM: Blood pressure 122/72, pulse 84, height '5\' 2"'  (1.575 m), weight 200 lb (90.7 kg). General: No acute distress.  Patient appears well-groomed.   Head:  Normocephalic/atraumatic Eyes:  Fundi examined but not visualized Neck: supple, no paraspinal tenderness, full range of motion Heart:  Regular rate and rhythm Lungs:  Clear to auscultation bilaterally Back: No paraspinal tenderness Neurological Exam: alert and oriented to person, place, and time. Attention span and concentration intact, recent and remote memory intact, fund of knowledge intact.  Speech fluent and not dysarthric, language intact.  CN II-XII intact. Bulk and tone normal, muscle strength 5/5 throughout.  Sensation to light touch intact.  Deep tendon reflexes 2+ throughout, toes downgoing.  Finger to nose and heel to shin testing intact.  Gait normal, Romberg negative.  IMPRESSION: Chronic headaches.  May be tension-type headache related to sleep apnea (although states she is wearing an O2 mask at night, but she reports improvement since initiating O2), cervicogenic etiology or medication overuse. Symptoms described are not consistent with migraines.  PLAN: 1.  For preventative management, increase nortriptyline to 55m at bedtime.  If headaches not improved in 2 months, we can increase to 1060mat bedtime 2.  For abortive therapy,  Tylenol but advised to limit use of pain relievers to no more than 2 days out of week to prevent risk of rebound or medication-overuse headache. 3.  Keep headache diary 4.  Exercise, hydration, caffeine cessation, sleep hygiene, monitor for and avoid triggers 5.  Consider:  magnesium citrate 48m daily, riboflavin 4037mdaily, and coenzyme Q10 10066mhree times daily 6.  Follow up in 4 months  AdaMetta ClinesO  CC: StaBilley GoslingD

## 2018-04-17 ENCOUNTER — Encounter: Payer: Self-pay | Admitting: Neurology

## 2018-04-17 ENCOUNTER — Ambulatory Visit (INDEPENDENT_AMBULATORY_CARE_PROVIDER_SITE_OTHER): Payer: Medicare Other | Admitting: Neurology

## 2018-04-17 VITALS — BP 122/72 | HR 84 | Ht 62.0 in | Wt 200.0 lb

## 2018-04-17 DIAGNOSIS — G4733 Obstructive sleep apnea (adult) (pediatric): Secondary | ICD-10-CM

## 2018-04-17 DIAGNOSIS — G44229 Chronic tension-type headache, not intractable: Secondary | ICD-10-CM

## 2018-04-17 MED ORDER — NORTRIPTYLINE HCL 75 MG PO CAPS: 75.0000 mg | ORAL_CAPSULE | Freq: Every day | ORAL | 5 refills | Status: DC

## 2018-04-17 NOTE — Patient Instructions (Signed)
1.  Increase nortriptyline to 75mg  at bedtime.  If headaches not improved in 2 months, contact us. 2.  Limit use of Tylenol to no more than 2 days out of week to prevent risk of rebound or medication-overuse headache. 3.  Keep headache diary 4.  Continue oxygen at night 5.  Follow up in 4 months

## 2018-04-20 ENCOUNTER — Other Ambulatory Visit: Payer: Self-pay

## 2018-04-20 MED ORDER — FAMOTIDINE 40 MG PO TABS: 40.0000 mg | ORAL_TABLET | Freq: Every day | ORAL | 1 refills | Status: DC | PRN

## 2018-05-10 ENCOUNTER — Other Ambulatory Visit: Payer: Self-pay | Admitting: Neurology

## 2018-06-08 ENCOUNTER — Telehealth: Payer: Self-pay | Admitting: *Deleted

## 2018-06-08 NOTE — Telephone Encounter (Signed)
Spoke with patient to cancel upcoming appointment in the [facility] 4 research trial.  Subject doing well, no cos.  Will call to reschedule once clinic opens back up for visits.

## 2018-06-12 ENCOUNTER — Telehealth: Payer: Self-pay | Admitting: Nurse Practitioner

## 2018-06-12 ENCOUNTER — Encounter: Payer: Self-pay | Admitting: Nurse Practitioner

## 2018-06-12 ENCOUNTER — Ambulatory Visit (INDEPENDENT_AMBULATORY_CARE_PROVIDER_SITE_OTHER): Payer: Medicare Other | Admitting: Nurse Practitioner

## 2018-06-12 ENCOUNTER — Other Ambulatory Visit: Payer: Self-pay

## 2018-06-12 DIAGNOSIS — J449 Chronic obstructive pulmonary disease, unspecified: Secondary | ICD-10-CM | POA: Diagnosis not present

## 2018-06-12 MED ORDER — PREDNISONE 10 MG PO TABS: 10.0000 mg | ORAL_TABLET | Freq: Every day | ORAL | 0 refills | Status: AC

## 2018-06-12 MED ORDER — MONTELUKAST SODIUM 5 MG PO CHEW: 5.0000 mg | CHEWABLE_TABLET | Freq: Every day | ORAL | 3 refills | Status: DC

## 2018-06-12 MED ORDER — FLUTICASONE-SALMETEROL 250-50 MCG/DOSE IN AEPB: 1.0000 | INHALATION_SPRAY | Freq: Two times a day (BID) | RESPIRATORY_TRACT | 5 refills | Status: DC

## 2018-06-12 MED ORDER — AZITHROMYCIN 250 MG PO TABS: ORAL_TABLET | ORAL | 0 refills | Status: DC

## 2018-06-12 NOTE — Assessment & Plan Note (Signed)
Patient presents for follow-up visit today.  She states that she cannot afford Symbicort and did not get her Advair from the pharmacy.  She is only been using albuterol as needed.Last couple weeks she has been having increased shortness of breath and cough.  States that her cough has been productive of yellow sputum.  States that she has been having wheezing.  She does continue to use 2 L of oxygen at night.  Patient Instructions  COPD-asthma overlap syndrome with acute worsening: Start taking Advair 1 puff twice daily Will reorder singulair Use albuterol as needed for chest tightness wheezing or shortness of breath  Exacerbation: Will order azithromycin Will order prednisone  Nocturnal hypoxemia: Continue oxygen again 2 L at night  Follow up with Dr. Lake Bells in 4 months.  However, if restarting Advair and Singulair  do not help relieve your symptoms then please call us so that we can see you sooner

## 2018-06-12 NOTE — Patient Instructions (Addendum)
COPD-asthma overlap syndrome with acute worsening: Start taking Advair 1 puff twice daily Will reorder singulair Use albuterol as needed for chest tightness wheezing or shortness of breath  Exacerbation: Will order azithromycin Will order prednisone  Nocturnal hypoxemia: Continue oxygen again 2 L at night  Follow up with Dr. Lake Bells in 4 months.  However, if restarting Advair and Singulair  do not help relieve your symptoms then please call us so that we can see you sooner

## 2018-06-12 NOTE — Progress Notes (Signed)
'@Patient'  ID: [name], female    DOB: March 05, 1940, 78 y.o.   MRN: [id number]  Chief Complaint  Patient presents with  . Follow-up    4 month follow up - COPD with asthma    Referring provider: Binnie Rail, MD  HPI  78 year old female with COPD-asthma overlap syndrome and OSA who is followed by Dr. Lake Bells.  Synopsis: Former patient of Dr. Ashok Cordia with COPD-Asthma overlap syndrome, GERD, OSA; She is a retired Education officer, museum from [employer]. She smoked 1/2 ppd for about 35 years, quit at age 79.  Tests: PFT 12/23/16: FVC 1.44 L (72%) FEV1 0.96 L (63%) FEV1/FVC 0.67 FEF 25-75 0.55 L (42%) negative bronchodilator response TLC 3.85 L (78%) RV 102% DLCO uncorrected 45% 06/04/15: FVC 1.57 L (77%) FEV1 1.01 L (64%) FEV1/FVC 0.65 FEF 25-75 0.54 L (39%) negative bronchodilator response 02/21/15: FVC 1.60 L (78%) FEV1 1.03 L (65%) FEV1/FVC 0.64 FEF 25-75 0.51 L (37%) positive bronchodilator response 11/20/14: FVC 1.44 L (70%) FEV1 0.91 L (57%) FEV1/FVC 0.63 FEF 25-75 0.47 L (33%) positive bronchodilator response TLC 3.92 L (79%) RV 103% DLCO uncorrected 64% 11/12/13: FVC 1.57 L (74%) FEV1 1.09 L (67%) FEV1/FVC 0.70 FEF 25-75 0.72 L (50%) negative bronchodilator response 08/04/11: FVC 1.61 L (60%) FEV1 0.89 L (48%) FEV1/FVC 0.56 FEF 25-75 0.29 L (14%) positive bronchodilator response TLC 3.67 L (80%) RV 103% ERV 46% DLCO corrected 38%  6MWT 11/20/14:  Walked 336 meters / Baseline Sat 100% on RA / Nadir Sat 99% on RA (pt c/o pain in her right neck w/ dyspnea)  IMAGING November 2018 CT angiogram chest images independently reviewed showing centrilobular predominant emphysema, mild in an upper lobe distribution, no PE, there is a 5 mm right upper lobe nodule.  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 LIMA to LAD is patent.  Prox RCA lesion is 25% stenosed. SVG to PDA is occluded.  Mid RCA lesion is 25% stenosed.   Circumflex is a large tortuous vessel.  Ost 1st Diag lesion is 100% stenosed. SVG to diagonal is patent.  The left ventricular systolic function is normal.  LV end diastolic pressure is normal.  The left ventricular ejection fraction is 55-65% by visual estimate.  There is no aortic valve stenosis.  TTE (10/06/11): LV normal in size. Normal regional wall motion. EF 60-65%. Grade 1 diastolic dysfunction. LA & RA normal in size. 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 RV normal in size and function. RVSP 19 mmHg. No aortic stenosis or regurgitation. No mitral stenosis or regurgitation. No pulmonic stenosis. Trivial tricuspid regurgitation. No pericardial effusion.  LABS 03/13/15 Procalcitonin:  <0.1 CBC: 10.9/11.8/37.3/245  10/08/14 IgG: 1060 IgM: 112 IgA: 272 IgE: 5 CBC: 6.1/11.3/36.0/293 Differential: Eos 0.3 (4.5%)  RAST panel: Negative Aspergillus antigen: <0.1  1/26/9 ABG:  7.39/44/113  Sleep study: 03/2017 Home sleep study showed no sleep apnea but her oxygen level dropped to 2L [place]   OV 06/12/18 -follow-up Patient presents today for follow-up.  She was last seen by Dr. Lake Bells on 02/24/2018.  At that time she has been treated for COPD-asthma overlap syndrome with Symbicort.  Unfortunately Symbicort was too expensive for her to afford so she was switched to Advair.  Patient states that she never started the Advair.  She has been using albuterol as needed for shortness of breath.  Last couple weeks she has been having increased shortness of breath and cough.  States  that her cough has been productive of yellow sputum.  States that she has been having wheezing.  She does continue to use 2 L of oxygen at night. Denies f/c/s, n/v/d, hemoptysis, PND, leg swelling.    Allergies  Allergen Reactions  . Amlodipine Other (See Comments)    hallucinations   . Banana Nausea And Vomiting    Stomach pumped  . Co Q10 [Coenzyme Q10]     Body cramps  . Codeine     Hallucinate,  loose identity and don't know who I am  . Metformin And Related Nausea And Vomiting  . Morphine Other (See Comments)    Can not function, it immobilizes me   . Pentazocine Nausea And Vomiting  . Pravastatin     Hands locked up  . Repatha [Evolocumab]     myalgias  . Statins     Muscle cramps  . Sulfa Antibiotics Swelling  . Sulfonamide Derivatives Swelling  . Tramadol Hcl     Immunization History  Administered Date(s) Administered  . Influenza Split 12/15/2011, 11/08/2012, 11/19/2014  . Influenza Whole 10/09/2008  . Influenza, High Dose Seasonal PF 11/19/2015, 11/23/2016, 10/26/2017  . Influenza-Unspecified 10/29/2013  . Pneumococcal Conjugate-13 09/03/2015  . Pneumococcal Polysaccharide-23 02/09/2004  . Pneumococcal-Unspecified 02/09/2012    Past Medical History:  Diagnosis Date  . Adenomatous colon polyp   . Allergy   . Anxiety   . Asthma   . Chest pain   . Chronic diastolic CHF (congestive heart failure) ([place])   . COPD (chronic obstructive pulmonary disease) ([place])    pt is unsure if has been officially diagnosed  . Coronary artery disease    CABG '09- cathed 12/09, 9/10, 6/11, 3/14 and 12/13/16- medical Rx  . Diabetes mellitus   . GERD (gastroesophageal reflux disease)   . Hiatal hernia   . Hyperlipidemia   . Hypertension   . Myocardial infarction ([place]) 2009  . PONV (postoperative nausea and vomiting)   . Schatzki's ring   . Shoulder injury    resolved after shoulder surgery  . Sleep apnea    not on cpap    Tobacco History: Social History   Tobacco Use  Smoking Status Former Smoker  . Packs/day: 0.50  . Years: 21.00  . Pack years: 10.50  . Types: Cigarettes  . Start date: 02/09/1955  . Last attempt to quit: 02/09/1976  . Years since quitting: 42.3  Smokeless Tobacco Never Used   Counseling given: Yes   Outpatient Encounter Medications as of 06/12/2018  Medication Sig  . albuterol (PROVENTIL HFA;VENTOLIN HFA) 108 (90 BASE) MCG/ACT inhaler Inhale 2  puffs into the lungs every 6 (six) hours as needed. For shortness of breath.  Marland Kitchen albuterol (PROVENTIL) (2.5 MG/3ML) 0.083% nebulizer solution Take 3 mLs (2.5 mg total) by nebulization every 6 (six) hours as needed for wheezing or shortness of breath.  Marland Kitchen alum & mag hydroxide-simeth (MAALOX/MYLANTA) 200-200-20 MG/5ML suspension Take 30 mLs as needed by mouth for indigestion or heartburn.  Marland Kitchen aspirin EC 81 MG tablet Take 1 tablet (81 mg total) by mouth daily.  . baclofen (LIORESAL) 20 MG tablet TAKE 1 TABLET BY MOUTH THREE TIMES A DAY  . Blood Glucose Monitoring Suppl (ONE TOUCH ULTRA MINI) w/Device KIT 3 (three) times daily. for testing  . calcium carbonate (TUMS - DOSED IN MG ELEMENTAL CALCIUM) 500 MG chewable tablet Chew 2 tablets daily as needed by mouth for indigestion or heartburn.  . Cholecalciferol (VITAMIN D) 2000 UNITS tablet Take 2,000 Units by  mouth daily.   . clonazePAM (KLONOPIN) 0.5 MG tablet Take 0.5 mg daily by mouth.  . CREON 24000-76000 units CPEP TAKE 2 CAPSULES BY MOUTH 3 TIMES A DAY WITH MEALS (TAK 1 CAPSULE WITH A SNACK)  . dapagliflozin propanediol (FARXIGA) 5 MG TABS tablet Take 5 mg by mouth daily.  . diclofenac sodium (VOLTAREN) 1 % GEL Apply 2 g topically 4 (four) times daily as needed (muscle pain).  Marland Kitchen diltiazem (CARDIZEM CD) 240 MG 24 hr capsule TAKE 1 CAPSULE BY MOUTH EVERY DAY  . ezetimibe (ZETIA) 10 MG tablet Take 10 mg by mouth daily.  . famotidine (PEPCID) 40 MG tablet Take 1 tablet (40 mg total) by mouth daily as needed.  . fexofenadine (ALLEGRA) 180 MG tablet Take 1 tablet (180 mg total) by mouth daily. (Patient taking differently: Take 180 mg by mouth daily as needed. )  . fluticasone (FLONASE) 50 MCG/ACT nasal spray Place 2 sprays into both nostrils daily.  . Fluticasone-Salmeterol (ADVAIR DISKUS) 250-50 MCG/DOSE AEPB Inhale 1 puff into the lungs 2 (two) times daily.  . insulin lispro protamine-insulin lispro (HUMALOG 75/25) (75-25) 100 UNIT/ML SUSP Inject 15-22  Units into the skin See admin instructions. Takes 16 units in the morning and 25  units with supper  . isosorbide mononitrate (IMDUR) 60 MG 24 hr tablet Take 1.5 tablets (90 mg total) by mouth daily.  Marland Kitchen loperamide (IMODIUM) 2 MG capsule Take 2 mg by mouth as needed for diarrhea or loose stools.   . meclizine (ANTIVERT) 25 MG tablet Take 25 mg by mouth 2 (two) times daily as needed for dizziness.   . metoprolol succinate (TOPROL-XL) 100 MG 24 hr tablet TAKE 1 TABLET BY MOUTH EVERY DAY  . Multiple Vitamin (MULTIVITAMIN WITH MINERALS) TABS Take 1 tablet by mouth daily.  . Nebulizers (COMPRESSOR/NEBULIZER) MISC Use with albuterol  . nitroGLYCERIN (NITROSTAT) 0.4 MG SL tablet Place 0.4 mg under the tongue every 5 (five) minutes as needed for chest pain.  . nortriptyline (PAMELOR) 75 MG capsule TAKE 1 CAPSULE (75 MG TOTAL) BY MOUTH AT BEDTIME.  . ONE TOUCH ULTRA TEST test strip CHECK BLOOD SUGAR TWICE A DAY DX: E11.9  . ONETOUCH DELICA LANCETS 93T MISC 3 (three) times daily. for testing  . pantoprazole (PROTONIX) 40 MG tablet Take 1 tablet (40 mg total) by mouth 2 (two) times daily before a meal.  . Spacer/Aero-Holding Chambers (AEROCHAMBER MV) inhaler Use as instructed  . [DISCONTINUED] Fluticasone-Salmeterol (ADVAIR DISKUS) 250-50 MCG/DOSE AEPB Inhale 1 puff into the lungs 2 (two) times daily.  . [DISCONTINUED] Fluticasone-Umeclidin-Vilant (TRELEGY ELLIPTA) 100-62.5-25 MCG/INH AEPB Inhale 1 puff into the lungs daily.  Marland Kitchen azithromycin (ZITHROMAX) 250 MG tablet Take 2 tablets (500 mg) on day 1, then take 1 tablet (250 mg) on days 2-5  . furosemide (LASIX) 40 MG tablet Take 1 tablet (40 mg total) by mouth daily.  . montelukast (SINGULAIR) 5 MG chewable tablet Chew 1 tablet (5 mg total) by mouth at bedtime.  . predniSONE (DELTASONE) 10 MG tablet Take 1 tablet (10 mg total) by mouth daily with breakfast for 5 days.  . [DISCONTINUED] montelukast (SINGULAIR) 5 MG chewable tablet Chew 1 tablet (5 mg total) by  mouth at bedtime. (Patient not taking: Reported on 06/12/2018)   Facility-Administered Encounter Medications as of 06/12/2018  Medication  . cyanocobalamin ((VITAMIN B-12)) injection 1,000 mcg     Review of Systems  Review of Systems  Constitutional: Negative.  Negative for chills and fever.  HENT: Negative.  Respiratory: Positive for cough, shortness of breath and wheezing.   Cardiovascular: Negative.  Negative for chest pain, palpitations and leg swelling.  Gastrointestinal: Negative.   Allergic/Immunologic: Negative.   Neurological: Negative.   Psychiatric/Behavioral: Negative.        Physical Exam  BP 128/70 (BP Location: Right Arm, Patient Position: Sitting, Cuff Size: Normal)   Pulse 68   Ht '5\' 2"'  (1.575 m)   Wt 202 lb 6.4 oz (91.8 kg)   LMP  (LMP Unknown)   BMI 37.02 kg/m   Wt Readings from Last 5 Encounters:  06/12/18 202 lb 6.4 oz (91.8 kg)  04/17/18 200 lb (90.7 kg)  02/24/18 200 lb 3.2 oz (90.8 kg)  01/24/18 200 lb (90.7 kg)  01/24/18 200 lb (90.7 kg)     Physical Exam Vitals signs and nursing note reviewed.  Constitutional:      General: She is not in acute distress.    Appearance: She is well-developed.  Cardiovascular:     Rate and Rhythm: Normal rate and regular rhythm.  Pulmonary:     Effort: Pulmonary effort is normal. No respiratory distress.     Breath sounds: Normal breath sounds. No wheezing or rhonchi.  Musculoskeletal:        General: No swelling.  Neurological:     Mental Status: She is alert and oriented to person, place, and time.       Assessment & Plan:   COPD with asthma [facility]) Patient presents for follow-up visit today.  She states that she cannot afford Symbicort and did not get her Advair from the pharmacy.  She is only been using albuterol as needed.Last couple weeks she has been having increased shortness of breath and cough.  States that her cough has been productive of yellow sputum.  States that she has been having  wheezing.  She does continue to use 2 L of oxygen at night.  Patient Instructions  COPD-asthma overlap syndrome with acute worsening: Start taking Advair 1 puff twice daily Will reorder singulair Use albuterol as needed for chest tightness wheezing or shortness of breath  Exacerbation: Will order azithromycin Will order prednisone  Nocturnal hypoxemia: Continue oxygen again 2 L at night  Follow up with Dr. Lake Bells in 4 months.  However, if restarting Advair and Singulair  do not help relieve your symptoms then please call us so that we can see you sooner       Fenton Foy, NP 06/12/2018

## 2018-06-12 NOTE — Telephone Encounter (Signed)
Called and spoke with [name] in [place] Provided J44.9 for Zpak to be prescribed for patient Nothing further needed.

## 2018-06-13 NOTE — Progress Notes (Signed)
Reviewed, agree 

## 2018-06-15 ENCOUNTER — Telehealth: Payer: Self-pay | Admitting: *Deleted

## 2018-06-15 NOTE — Telephone Encounter (Signed)
Spoke with patient for follow up visit in the [place] [facility] clinical trial vs. In person due to Covid 19 restrictions at the facility.  Next visit scheduled.

## 2018-06-20 ENCOUNTER — Other Ambulatory Visit: Payer: Self-pay | Admitting: Internal Medicine

## 2018-06-29 ENCOUNTER — Other Ambulatory Visit: Payer: Self-pay

## 2018-06-29 ENCOUNTER — Other Ambulatory Visit: Payer: Medicare Other

## 2018-06-29 ENCOUNTER — Ambulatory Visit: Payer: Medicare Other | Admitting: Pulmonary Disease

## 2018-06-29 ENCOUNTER — Encounter: Payer: Self-pay | Admitting: Pulmonary Disease

## 2018-06-29 ENCOUNTER — Ambulatory Visit (INDEPENDENT_AMBULATORY_CARE_PROVIDER_SITE_OTHER): Payer: Medicare Other | Admitting: Pulmonary Disease

## 2018-06-29 VITALS — BP 128/82 | HR 71 | Temp 98.3°F | Ht 62.0 in | Wt 207.0 lb

## 2018-06-29 DIAGNOSIS — J440 Chronic obstructive pulmonary disease with acute lower respiratory infection: Secondary | ICD-10-CM | POA: Diagnosis not present

## 2018-06-29 DIAGNOSIS — E1059 Type 1 diabetes mellitus with other circulatory complications: Secondary | ICD-10-CM | POA: Diagnosis not present

## 2018-06-29 DIAGNOSIS — J209 Acute bronchitis, unspecified: Secondary | ICD-10-CM

## 2018-06-29 DIAGNOSIS — J449 Chronic obstructive pulmonary disease, unspecified: Secondary | ICD-10-CM | POA: Diagnosis not present

## 2018-06-29 LAB — GLUCOSE, POCT (MANUAL RESULT ENTRY): POC Glucose: 154 mg/dl — AB (ref 70–99)

## 2018-06-29 MED ORDER — DOXYCYCLINE HYCLATE 100 MG PO TABS: 100.0000 mg | ORAL_TABLET | Freq: Two times a day (BID) | ORAL | 0 refills | Status: DC

## 2018-06-29 MED ORDER — TIOTROPIUM BROMIDE MONOHYDRATE 2.5 MCG/ACT IN AERS: 2.0000 | INHALATION_SPRAY | Freq: Every day | RESPIRATORY_TRACT | 0 refills | Status: DC

## 2018-06-29 NOTE — Patient Instructions (Addendum)
We will check your blood sugars today in office >>>154 today   Chest Xray today   Doxycycline >>> 1 100 mg tablet every 12 hours for 10 days >>>take with food  >>>wear sunscreen   Continue Advair 250 2 puffs every 12 hours rinse mouth out after use Take this every day no matter what  Trial of Spiriva Respimat 2.5 >>> 2 puffs daily >>> Do this every day >>>This is not a rescue inhaler  Contact our office in 7 days and let us know how you are doing with the Spiriva Respimat inhaler, if you feel it is helping with your breathing then we will send a prescription for this  Only use your albuterol as a rescue medication to be used if you can't catch your breath by resting or doing a relaxed purse lip breathing pattern.  - The less you use it, the better it will work when you need it. - Ok to use up to 2 puffs  every 4 hours if you must but call for immediate appointment if use goes up over your usual need - Don't leave home without it !!  (think of it like the spare tire for your car)     I am very concerned regarding your blood sugars.  I would like for you to follow-up with your endocrinologist today as well as primary care and let them know how your blood sugars are running.   Continue Singulair  Continue Allegra     If your blood sugars remain elevated or you start having worsened fatigue, vision problems, headaches, or acute worsening shortness of breath you need to present to an emergency room for further evaluation.   Return in about 4 weeks (around 07/27/2018), or if symptoms worsen or fail to improve, for Follow up with Dr. Lake Bells, Follow up with Wyn Quaker FNP-C.    Coronavirus (COVID-19) Are you at risk?  Are you at risk for the Coronavirus (COVID-19)?  To be considered HIGH RISK for Coronavirus (COVID-19), you have to meet the following criteria:  . Traveled to [place], [place], [place], [place] or [place]; or in the [place] to [place], [place], [place], or [place]; and have fever, cough, and shortness of breath within the last 2 weeks of travel OR . Been in close contact with a person diagnosed with COVID-19 within the last 2 weeks and have fever, cough, and shortness of breath . IF YOU DO NOT MEET THESE CRITERIA, YOU ARE CONSIDERED LOW RISK FOR COVID-19.  What to do if you are HIGH RISK for COVID-19?  Marland Kitchen If you are having a medical emergency, call 911. . Seek medical care right away. Before you go to a doctor's office, urgent care or emergency department, call ahead and tell them about your recent travel, contact with someone diagnosed with COVID-19, and your symptoms. You should receive instructions from your physician's office regarding next steps of care.  . When you arrive at healthcare provider, tell the healthcare staff immediately you have returned from visiting [place], [place], [place], [place] or [place]; or traveled in the [place] to [place], [place], [place], or [place]; in the last two weeks or you have been in close contact with a person diagnosed with COVID-19 in the last 2 weeks.   . Tell the health care staff about your symptoms: fever, cough and shortness of breath. . After you have been seen by a medical provider, you will be either: o Tested for (  COVID-19) and discharged home on quarantine except to seek medical care if symptoms worsen, and asked to  - Stay home and avoid contact with others until you get your results (4-5 days)  - Avoid travel on public transportation if possible (such as bus, train, or airplane) or o Sent to the Emergency Department by EMS for evaluation, COVID-19 testing, and possible admission depending on your condition and test results.  What to do if you are LOW RISK for COVID-19?  Reduce your risk of any infection by using the same precautions used for avoiding the common cold or flu:  Marland Kitchen Wash your hands often with soap and warm water for at least 20 seconds.  If soap and water are  not readily available, use an alcohol-based hand sanitizer with at least 60% alcohol.  . If coughing or sneezing, cover your mouth and nose by coughing or sneezing into the elbow areas of your shirt or coat, into a tissue or into your sleeve (not your hands). . Avoid shaking hands with others and consider head nods or verbal greetings only. . Avoid touching your eyes, nose, or mouth with unwashed hands.  . Avoid close contact with people who are sick. . Avoid places or events with large numbers of people in one location, like concerts or sporting events. . Carefully consider travel plans you have or are making. . If you are planning any travel outside or inside the [place], visit the CDC's Travelers' Health webpage for the latest health notices. . If you have some symptoms but not all symptoms, continue to monitor at home and seek medical attention if your symptoms worsen. . If you are having a medical emergency, call 911.   [place] / e-Visit: [email or web address]         [facility]: [facility]: [phone]                   [facility]: [phone]           It is flu season:   >>> Best ways to protect herself from the flu: Receive the yearly flu vaccine, practice good hand hygiene washing with soap and also using hand sanitizer when available, eat a nutritious meals, get adequate rest, hydrate appropriately   Please contact the office if your symptoms worsen or you have concerns that you are not improving.   Thank you for choosing [facility] for your healthcare, and for allowing [place] to partner with you on your healthcare journey. I am thankful to be able to provide care to you today.   Wyn Quaker FNP-C    Acute Bronchitis, Adult Acute bronchitis is when air tubes (bronchi) in the lungs suddenly get swollen. The  condition can make it hard to breathe. It can also cause these symptoms:  A cough.  Coughing up clear, yellow, or green mucus.  Wheezing.  Chest congestion.  Shortness of breath.  A fever.  Body aches.  Chills.  A sore throat. Follow these instructions at home:  Medicines  Take over-the-counter and prescription medicines only as told by your doctor.  If you were prescribed an antibiotic medicine, take it as told by your doctor. Do not stop taking the antibiotic even if you start to feel better. General instructions  Rest.  Drink enough fluids to keep your pee (urine) pale yellow.  Avoid smoking and secondhand smoke. If you smoke and you need  help quitting, ask your doctor. Quitting will help your lungs heal faster.  Use an inhaler, cool mist vaporizer, or humidifier as told by your doctor.  Keep all follow-up visits as told by your doctor. This is important. How is this prevented? To lower your risk of getting this condition again:  Wash your hands often with soap and water. If you cannot use soap and water, use hand sanitizer.  Avoid contact with people who have cold symptoms.  Try not to touch your hands to your mouth, nose, or eyes.  Make sure to get the flu shot every year. Contact a doctor if:  Your symptoms do not get better in 2 weeks. Get help right away if:  You cough up blood.  You have chest pain.  You have very bad shortness of breath.  You become dehydrated.  You faint (pass out) or keep feeling like you are going to pass out.  You keep throwing up (vomiting).  You have a very bad headache.  Your fever or chills gets worse. This information is not intended to replace advice given to you by your health care provider. Make sure you discuss any questions you have with your health care provider. Document Released: 07/14/2007 Document Revised: 09/08/2016 Document Reviewed: 07/16/2015 Elsevier Interactive Patient Education  2019 [employer].

## 2018-06-29 NOTE — Progress Notes (Signed)
'@Patient'  ID: [name], female    DOB: September 11, 1940, 78 y.o.   MRN: [id number]  Chief Complaint  Patient presents with  . Cough    with yellow congestion for the last month.    Referring provider: Binnie Rail, MD  HPI:  78 year old female former smoker followed in our office for COPD asthma overlap syndrome  PMH: Hypertension, type 1 diabetes, pancreatic insufficiency, GERD, hyperlipidemia Smoker/ Smoking History: Former smoker.  Maintenance: Advair 250 Pt of: Dr. Lake Bells  06/29/2018  - Visit   78 year old female former smoker presenting to our office today as a walk-in acute visit.  Patient initially thought that she was a scheduled follow-up today but she had just recently completed a follow-up visit with [place] earlier this month.  Patient was treated as an asthma exacerbation and restarted on her Advair 250.  Patient was treated with azithromycin as well as a course of prednisone.  Patient reports that she still has had a persistent cough since that appointment.  She denies body aches, fevers, chills.  She also continues to struggle with blood sugar management.  She reports that she has been working with her endocrinologist Dr. Zachery Conch on this.  She is a type I diabetic be managed with Humalog 75/25.  She does not have a continuous glucose monitor.  She has had issues in the past of hypoglycemia.  Patient is also managed on Creon but she reports she has been out of this for the last 2 months.  She is wondering if we can refill this today.  Patient reports that she did feel that her breathing improved slightly after resuming her Advair after last office visit.  Unfortunately her blood sugars have ranged between low 200s to as high as 515 at home.  She reports she had a blood sugar result of 515 last week.  Blood sugar today in office is 154.  Patient has been working with her endocrinologist weekly with adjusting her insulins.  Patient has a myriad of allergies unfortunately she has a  intolerance of metformin which causes severe nausea vomiting per patient as well as an intolerance to Lantus which also caused nausea and vomiting.  Patient has never tried Levemir or [place].   Tests:   PFT 12/23/16: FVC 1.44 L (72%) FEV1 0.96 L (63%) FEV1/FVC 0.67 FEF 25-75 0.55 L (42%) negative bronchodilator response TLC 3.85 L (78%) RV 102% DLCO uncorrected 45% 06/04/15: FVC 1.57 L (77%) FEV1 1.01 L (64%) FEV1/FVC 0.65 FEF 25-75 0.54 L (39%) negative bronchodilator response 02/21/15: FVC 1.60 L (78%) FEV1 1.03 L (65%) FEV1/FVC 0.64 FEF 25-75 0.51 L (37%) positive bronchodilator response 11/20/14: FVC 1.44 L (70%) FEV1 0.91 L (57%) FEV1/FVC 0.63 FEF 25-75 0.47 L (33%) positive bronchodilator response TLC 3.92 L (79%) RV 103% DLCO uncorrected 64% 11/12/13: FVC 1.57 L (74%) FEV1 1.09 L (67%) FEV1/FVC 0.70 FEF 25-75 0.72 L (50%) negative bronchodilator response 08/04/11: FVC 1.61 L (60%) FEV1 0.89 L (48%) FEV1/FVC 0.56 FEF 25-75 0.29 L (14%) positive bronchodilator response TLC 3.67 L (80%) RV 103% ERV 46% DLCO corrected 38%  6MWT 11/20/14: Walked 336 meters / Baseline Sat 100% on RA / Nadir Sat 99% on RA (pt c/o pain in her right neck w/ dyspnea)  IMAGING November 2018 CT angiogram chest images showing centrilobular predominant emphysema, mild in an upper lobe distribution, no PE, there is a 5 mm right upper lobe nodule. 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 LIMA to LAD is patent.  Prox RCA lesion is 25% stenosed. SVG to PDA is occluded.  Mid RCA lesion is 25% stenosed.  Circumflex is a large tortuous vessel.  Ost 1st Diag lesion is 100% stenosed. SVG to diagonal is patent.  The left ventricular systolic function is normal.  LV end diastolic pressure is normal.  The left ventricular ejection fraction is 55-65% by visual estimate.  There is no aortic valve stenosis.  TTE (10/06/11): LV normal in size. Normal  regional wall motion. EF 60-65%. Grade 1 diastolic dysfunction. LA & RA normal in size. 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 RV normal in size and function. RVSP 19 mmHg. No aortic stenosis or regurgitation. No mitral stenosis or regurgitation. No pulmonic stenosis. Trivial tricuspid regurgitation. No pericardial effusion.  LABS 03/13/15 Procalcitonin: <0.1 CBC: 10.9/11.8/37.3/245  10/08/14 IgG: 1060 IgM: 112 IgA: 272 IgE: 5 CBC: Differential: Eos 0.3 (4.5%)  RAST panel: Negative Aspergillus antigen: <0.1  1/26/9 ABG: 7.39/44/113  Sleep study: 03/2017 Home sleep study showed no sleep apnea but her oxygen level dropped to 2L [place]  FENO:  Lab Results  Component Value Date   NITRICOXIDE 7 05/05/2017    PFT: PFT Results Latest Ref Rng & Units 12/23/2016 06/04/2015 02/21/2015 11/20/2014 11/12/2013  FVC-Pre L 1.44 1.57 1.60 1.44 1.57  FVC-Predicted Pre % 72 77 78 70 74  FVC-Post L 1.50 1.63 1.82 1.73 1.66  FVC-Predicted Post % 75 80 89 84 79  Pre FEV1/FVC % % 67 65 64 63 70  Post FEV1/FCV % % 68 69 67 70 71  FEV1-Pre L 0.96 1.01 1.03 0.91 1.09  FEV1-Predicted Pre % 63 64 65 57 67  FEV1-Post L 1.03 1.12 1.23 1.21 1.17  DLCO UNC% % 45 - - 64 -  DLCO COR %Predicted % 73 - - 98 -  TLC L 3.85 - - 3.92 -  TLC % Predicted % 78 - - 79 -  RV % Predicted % 102 - - 103 -    Imaging: No results found.    Specialty Problems      Pulmonary Problems   Cough    Qualifier: Diagnosis of  By: Joya Gaskins MD, Burnett Harry       Extrinsic asthma    Severe persistent ASthma 08/04/2011  PFTs:  FeV1 48%  FVC 60%  25% improvement with BD TLC 80%  DLCO 38% ONO 08/08/2012 >>+nocturnal desats >40% <88% O2 sat >begin O2 2l/m At bedtime         OSA (obstructive sleep apnea)    Following with neurology      Acute bronchitis with COPD ([place])   COPD with asthma ([place])    12/23/16: FVC 1.44 L (72%) FEV1 0.96 L (63%) FEV1/FVC 0.67 FEF 25-75 0.55 L (42%) negative bronchodilator response TLC  3.85 L (78%) RV 102% DLCO uncorrected 45%  November 2018 CT angiogram chest images showing centrilobular predominant emphysema, mild in an upper lobe distribution, no PE, there is a 5 mm right upper lobe nodule. 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 Amlodipine Other (See Comments)    hallucinations   . Banana Nausea And Vomiting    Stomach pumped  . Co Q10 [Coenzyme Q10]     Body cramps  . Codeine     Hallucinate, loose identity and don't know who I am  .  Metformin And Related Nausea And Vomiting  . Morphine Other (See Comments)    Can not function, it immobilizes me   . Pentazocine Nausea And Vomiting  . Pravastatin     Hands locked up  . Repatha [Evolocumab]     myalgias  . Statins     Muscle cramps  . Sulfa Antibiotics Swelling  . Sulfonamide Derivatives Swelling  . Tramadol Hcl     Immunization History  Administered Date(s) Administered  . Influenza Split 12/15/2011, 11/08/2012, 11/19/2014  . Influenza Whole 10/09/2008  . Influenza, High Dose Seasonal PF 11/19/2015, 11/23/2016, 10/26/2017  . Influenza-Unspecified 10/29/2013  . Pneumococcal Conjugate-13 09/03/2015  . Pneumococcal Polysaccharide-23 02/09/2004  . Pneumococcal-Unspecified 02/09/2012    Past Medical History:  Diagnosis Date  . Adenomatous colon polyp   . Allergy   . Anxiety   . Asthma   . Chest pain   . Chronic diastolic CHF (congestive heart failure) ([place])   . COPD (chronic obstructive pulmonary disease) ([place])    pt is unsure if has been officially diagnosed  . Coronary artery disease    CABG '09- cathed 12/09, 9/10, 6/11, 3/14 and 12/13/16- medical Rx  . Diabetes mellitus   . GERD (gastroesophageal reflux disease)   . Hiatal hernia   . Hyperlipidemia   . Hypertension   . Myocardial infarction ([place]) 2009  . PONV (postoperative nausea and vomiting)   . Schatzki's ring   .  Shoulder injury    resolved after shoulder surgery  . Sleep apnea    not on cpap    Tobacco History: Social History   Tobacco Use  Smoking Status Former Smoker  . Packs/day: 0.50  . Years: 21.00  . Pack years: 10.50  . Types: Cigarettes  . Start date: 02/09/1955  . Last attempt to quit: 02/09/1976  . Years since quitting: 42.4  Smokeless Tobacco Never Used   Counseling given: Yes   Continue to not smoke  Outpatient Encounter Medications as of 06/29/2018  Medication Sig  . albuterol (PROVENTIL HFA;VENTOLIN HFA) 108 (90 BASE) MCG/ACT inhaler Inhale 2 puffs into the lungs every 6 (six) hours as needed. For shortness of breath.  Marland Kitchen albuterol (PROVENTIL) (2.5 MG/3ML) 0.083% nebulizer solution Take 3 mLs (2.5 mg total) by nebulization every 6 (six) hours as needed for wheezing or shortness of breath.  Marland Kitchen alum & mag hydroxide-simeth (MAALOX/MYLANTA) 200-200-20 MG/5ML suspension Take 30 mLs as needed by mouth for indigestion or heartburn.  Marland Kitchen aspirin EC 81 MG tablet Take 1 tablet (81 mg total) by mouth daily.  Marland Kitchen azithromycin (ZITHROMAX) 250 MG tablet Take 2 tablets (500 mg) on day 1, then take 1 tablet (250 mg) on days 2-5  . baclofen (LIORESAL) 20 MG tablet Take 1 tablet (20 mg total) by mouth 3 (three) times daily. Need office visit for more refills  . Blood Glucose Monitoring Suppl (ONE TOUCH ULTRA MINI) w/Device KIT 3 (three) times daily. for testing  . calcium carbonate (TUMS - DOSED IN MG ELEMENTAL CALCIUM) 500 MG chewable tablet Chew 2 tablets daily as needed by mouth for indigestion or heartburn.  . Cholecalciferol (VITAMIN D) 2000 UNITS tablet Take 2,000 Units by mouth daily.   . clonazePAM (KLONOPIN) 0.5 MG tablet Take 0.5 mg daily by mouth.  . CREON 24000-76000 units CPEP TAKE 2 CAPSULES BY MOUTH 3 TIMES A DAY WITH MEALS (TAK 1 CAPSULE WITH A SNACK)  . dapagliflozin propanediol (FARXIGA) 5 MG TABS tablet Take 5 mg by mouth daily.  . diclofenac sodium (  VOLTAREN) 1 % GEL Apply 2 g  topically 4 (four) times daily as needed (muscle pain).  Marland Kitchen diltiazem (CARDIZEM CD) 240 MG 24 hr capsule TAKE 1 CAPSULE BY MOUTH EVERY DAY  . ezetimibe (ZETIA) 10 MG tablet Take 10 mg by mouth daily.  . famotidine (PEPCID) 40 MG tablet Take 1 tablet (40 mg total) by mouth daily as needed.  . fexofenadine (ALLEGRA) 180 MG tablet Take 1 tablet (180 mg total) by mouth daily. (Patient taking differently: Take 180 mg by mouth daily as needed. )  . fluticasone (FLONASE) 50 MCG/ACT nasal spray Place 2 sprays into both nostrils daily.  . Fluticasone-Salmeterol (ADVAIR DISKUS) 250-50 MCG/DOSE AEPB Inhale 1 puff into the lungs 2 (two) times daily.  . insulin lispro protamine-insulin lispro (HUMALOG 75/25) (75-25) 100 UNIT/ML SUSP Inject 15-22 Units into the skin See admin instructions. Takes 16 units in the morning and 25  units with supper  . isosorbide mononitrate (IMDUR) 60 MG 24 hr tablet Take 1.5 tablets (90 mg total) by mouth daily.  Marland Kitchen loperamide (IMODIUM) 2 MG capsule Take 2 mg by mouth as needed for diarrhea or loose stools.   . meclizine (ANTIVERT) 25 MG tablet Take 25 mg by mouth 2 (two) times daily as needed for dizziness.   . metoprolol succinate (TOPROL-XL) 100 MG 24 hr tablet TAKE 1 TABLET BY MOUTH EVERY DAY  . montelukast (SINGULAIR) 5 MG chewable tablet Chew 1 tablet (5 mg total) by mouth at bedtime.  . Multiple Vitamin (MULTIVITAMIN WITH MINERALS) TABS Take 1 tablet by mouth daily.  . Nebulizers (COMPRESSOR/NEBULIZER) MISC Use with albuterol  . nitroGLYCERIN (NITROSTAT) 0.4 MG SL tablet Place 0.4 mg under the tongue every 5 (five) minutes as needed for chest pain.  . nortriptyline (PAMELOR) 75 MG capsule TAKE 1 CAPSULE (75 MG TOTAL) BY MOUTH AT BEDTIME.  . ONE TOUCH ULTRA TEST test strip CHECK BLOOD SUGAR TWICE A DAY DX: E11.9  . ONETOUCH DELICA LANCETS 45X MISC 3 (three) times daily. for testing  . pantoprazole (PROTONIX) 40 MG tablet Take 1 tablet (40 mg total) by mouth 2 (two) times  daily before a meal.  . Spacer/Aero-Holding Chambers (AEROCHAMBER MV) inhaler Use as instructed  . doxycycline (VIBRA-TABS) 100 MG tablet Take 1 tablet (100 mg total) by mouth 2 (two) times daily.  . furosemide (LASIX) 40 MG tablet Take 1 tablet (40 mg total) by mouth daily.  . Tiotropium Bromide Monohydrate (SPIRIVA RESPIMAT) 2.5 MCG/ACT AERS Inhale 2 puffs into the lungs daily.   Facility-Administered Encounter Medications as of 06/29/2018  Medication  . cyanocobalamin ((VITAMIN B-12)) injection 1,000 mcg     Review of Systems  Review of Systems  Constitutional: Negative for chills, fatigue, fever and unexpected weight change.  HENT: Positive for congestion and postnasal drip. Negative for sinus pressure and sinus pain.   Respiratory: Positive for cough, shortness of breath and wheezing. Negative for chest tightness.   Cardiovascular: Negative for chest pain and palpitations.  Gastrointestinal: Negative for diarrhea, nausea and vomiting.  Genitourinary: Negative for dysuria, frequency and urgency.  Musculoskeletal: Negative for arthralgias.  Skin: Negative for color change.  Allergic/Immunologic: Negative for environmental allergies and food allergies.  Neurological: Negative for dizziness, light-headedness and headaches.  Psychiatric/Behavioral: Negative for dysphoric mood. The patient is not nervous/anxious.   All other systems reviewed and are negative.    Physical Exam  BP 128/82 (BP Location: Left Arm, Patient Position: Sitting, Cuff Size: Normal)   Pulse 71   Temp 98.3 F (  36.8 C)   Ht '5\' 2"'  (1.575 m)   Wt 207 lb (93.9 kg)   LMP  (LMP Unknown)   SpO2 98%   BMI 37.86 kg/m   Wt Readings from Last 5 Encounters:  06/29/18 207 lb (93.9 kg)  06/12/18 202 lb 6.4 oz (91.8 kg)  04/17/18 200 lb (90.7 kg)  02/24/18 200 lb 3.2 oz (90.8 kg)  01/24/18 200 lb (90.7 kg)     Physical Exam  Constitutional: She is oriented to person, place, and time and well-developed,  well-nourished, and in no distress. No distress.  Chronically ill elderly female  HENT:  Head: Normocephalic and atraumatic.  Right Ear: Hearing and external ear normal.  Left Ear: Hearing and external ear normal.  Nose: Nose normal. Right sinus exhibits no maxillary sinus tenderness and no frontal sinus tenderness. Left sinus exhibits no maxillary sinus tenderness and no frontal sinus tenderness.  Mouth/Throat: Uvula is midline and oropharynx is clear and moist. No oropharyngeal exudate.  Postnasal drip  Eyes: Pupils are equal, round, and reactive to light.  Neck: Normal range of motion. Neck supple.  Cardiovascular: Normal rate, regular rhythm and normal heart sounds.  Pulmonary/Chest: Effort normal and breath sounds normal. No accessory muscle usage. No respiratory distress. She has no decreased breath sounds. She has no wheezes. She has no rhonchi. She has no rales.  Abdominal: Soft. Bowel sounds are normal. There is no abdominal tenderness.  Musculoskeletal: Normal range of motion.        General: No edema.  Lymphadenopathy:    She has no cervical adenopathy.  Neurological: She is alert and oriented to person, place, and time. Gait normal.  Skin: Skin is warm and dry. She is not diaphoretic. No erythema.  Psychiatric: Mood, memory, affect and judgment normal.  Nursing note and vitals reviewed.     Lab Results:  CBC    Component Value Date/Time   WBC 6.0 12/20/2017 0740   RBC 4.54 12/20/2017 0740   HGB 11.2 (L) 12/20/2017 0740   HGB 10.6 (L) 12/10/2016 1346   HCT 35.1 (L) 12/20/2017 0740   HCT 34.1 12/10/2016 1346   PLT 285.0 12/20/2017 0740   PLT 270 12/10/2016 1346   MCV 77.3 (L) 12/20/2017 0740   MCV 78 (L) 12/10/2016 1346   MCH 23.9 (L) 01/04/2017 1505   MCHC 31.8 12/20/2017 0740   RDW 17.6 (H) 12/20/2017 0740   RDW 16.1 (H) 12/10/2016 1346   LYMPHSABS 2.1 12/20/2017 0740   LYMPHSABS 2.2 06/21/2016 1628   MONOABS 0.5 12/20/2017 0740   EOSABS 0.2 12/20/2017 0740    EOSABS 0.1 06/21/2016 1628   BASOSABS 0.0 12/20/2017 0740   BASOSABS 0.0 06/21/2016 1628    BMET    Component Value Date/Time   NA 143 12/20/2017 0740   NA 144 05/19/2017 1444   K 4.0 12/20/2017 0740   CL 104 12/20/2017 0740   CO2 28 12/20/2017 0740   GLUCOSE 151 (H) 12/20/2017 0740   BUN 22 12/20/2017 0740   BUN 15 05/19/2017 1444   CREATININE 0.83 12/20/2017 0740   CALCIUM 9.0 12/20/2017 0740   GFRNONAA 70 05/19/2017 1444   GFRAA 80 05/19/2017 1444    BNP    Component Value Date/Time   BNP 315.0 (H) 01/04/2017 1505    ProBNP    Component Value Date/Time   PROBNP 128 06/21/2016 1628   PROBNP 158.0 (H) 04/17/2012 1123      Assessment & Plan:   Type 1 diabetes mellitus ([place]) Assessment:  Patient reporting blood sugars ranging between 200-515 over the last week Blood sugar in office today is 154 Managed by Dr. Chalmers Cater her endocrinologist Patient also reports that she has had bouts of hypoglycemia in the past Patient reports that it cost too much money for her to be on a continuous glucose monitor or insulin pump She has intolerance to Metformin as well as Lantus  Plan: She needs to contact endocrinology today to notify them of her blood sugars She also needs to follow-up with primary care Continue low-carb diet I believe she needs regular follow-up with nutritional therapy or diabetic educator If she starts needing recurrent prednisone tapers we may need to consider qualifying her for biologic therapy for management of her asthma  COPD with asthma [facility]) Assessment: 2018 pulmonary function test shows COPD Gold 2, FEV1 0.96 2018 CTA shows centrilobular predominant emphysema Managed on Advair Has had peripheral eosinophilia on blood work in the past RAST panel negative, IgE 5 in 2016 Lung sounds clear on exam today 4 weeks of purulent cough, after azithromycin coverage  Plan: Doxycycline today we will treat as acute bronchitis  Continue Advair 250 Start  Spiriva Respimat 2.5 today Use rescue inhaler as needed Follow-up with our office in 4 weeks Continue to work diligently on exercise We will try to limit corticosteroid use in the future due to patient's uncontrolled blood sugars being a type I diabetic May need to consider trying to qualify patient for biologic therapy if patient continues to have recurrent exacerbations requiring steroids   Acute bronchitis with COPD ([place]) Assessment: 4 weeks of purulent cough Lung sounds clear to auscultation today 4 weeks ago patient had empiric coverage with azithromycin  Plan: We will treat as acute bronchitis, do not need steroids his lung sounds are clear on exam today as well as patient has uncontrolled blood sugars  Doxycycline today Chest x-ray today 4-week follow-up with our office today    Return in about 4 weeks (around 07/27/2018), or if symptoms worsen or fail to improve, for Follow up with Dr. Lake Bells, Follow up with Wyn Quaker FNP-C.   Lauraine Rinne, NP 06/29/2018   This appointment was 35 minutes long with over 50% of the time in direct face-to-face patient care, assessment, plan of care, and follow-up.

## 2018-06-29 NOTE — Progress Notes (Signed)
Discussed results with patient today in office.  Nothing further is needed.  Patient to follow-up with endocrinology and primary care today.  Wyn Quaker, FNP

## 2018-06-29 NOTE — Assessment & Plan Note (Signed)
Assessment: Patient reporting blood sugars ranging between 200-515 over the last week Blood sugar in office today is 154 Managed by Dr. Chalmers Cater her endocrinologist Patient also reports that she has had bouts of hypoglycemia in the past Patient reports that it cost too much money for her to be on a continuous glucose monitor or insulin pump She has intolerance to Metformin as well as Lantus  Plan: She needs to contact endocrinology today to notify them of her blood sugars She also needs to follow-up with primary care Continue low-carb diet I believe she needs regular follow-up with nutritional therapy or diabetic educator If she starts needing recurrent prednisone tapers we may need to consider qualifying her for biologic therapy for management of her asthma

## 2018-06-29 NOTE — Progress Notes (Signed)
Patient seen in the office today and instructed on use of Spiriva respimat.  Patient expressed understanding and demonstrated technique.  

## 2018-06-29 NOTE — Progress Notes (Signed)
Reviewed, agree 

## 2018-06-29 NOTE — Assessment & Plan Note (Signed)
Assessment: 4 weeks of purulent cough Lung sounds clear to auscultation today 4 weeks ago patient had empiric coverage with azithromycin  Plan: We will treat as acute bronchitis, do not need steroids his lung sounds are clear on exam today as well as patient has uncontrolled blood sugars  Doxycycline today Chest x-ray today 4-week follow-up with our office today

## 2018-06-29 NOTE — Assessment & Plan Note (Signed)
Assessment: 2018 pulmonary function test shows COPD Gold 2, FEV1 0.96 2018 CTA shows centrilobular predominant emphysema Managed on Advair Has had peripheral eosinophilia on blood work in the past RAST panel negative, IgE 5 in 2016 Lung sounds clear on exam today 4 weeks of purulent cough, after azithromycin coverage  Plan: Doxycycline today we will treat as acute bronchitis  Continue Advair 250 Start Spiriva Respimat 2.5 today Use rescue inhaler as needed Follow-up with our office in 4 weeks Continue to work diligently on exercise We will try to limit corticosteroid use in the future due to patient's uncontrolled blood sugars being a type I diabetic May need to consider trying to qualify patient for biologic therapy if patient continues to have recurrent exacerbations requiring steroids

## 2018-07-23 ENCOUNTER — Other Ambulatory Visit: Payer: Self-pay | Admitting: Cardiovascular Disease

## 2018-07-25 ENCOUNTER — Ambulatory Visit: Payer: Medicare Other | Admitting: Pulmonary Disease

## 2018-07-31 NOTE — Patient Instructions (Addendum)
Call and follow up with cardiology.    Tests ordered today. Your results will be released to [place] (or called to you) after review, usually within 72hours after test completion. If any changes need to be made, you will be notified at that same time.   B12 injection administered today.   Medications reviewed and updated.  Changes include :   Stop pantoprazole.  Start lansoprazole twice daily.  Take pepcid daily.   Your prescription(s) have been submitted to your pharmacy. Please take as directed and contact our office if you believe you are having problem(s) with the medication(s).    Please followup in 6 months

## 2018-07-31 NOTE — Progress Notes (Signed)
Subjective:    Patient ID: [name], female    DOB: January 01, 1941, 78 y.o.   MRN: [id number]  HPI The patient is here for follow up.  She is exercising regularly.     She has been having cough, wheeze and SOB and saw pulmonary today.  She had a CXR.  She is using her inhalers.    CAD, Hypertension: She is taking her medication daily. She is compliant with a low sodium diet.  She is having SOB with exertion and is not able to walk far.  She is tired when she goes to rest.  She denies chest pain, palpitations, edema and regular headaches. She does not monitor her blood pressure at home.    Diabetes: She is following with Dr.  Chalmers Cater.  she is taking her medication daily as prescribed.  There have been changes to her insulin to help get better control.  Her sugars have been high and all over the place.  She is compliant with a diabetic diet. She denies numbness/tingling in her feet and foot lesions. She is up-to-date with an ophthalmology examination.   Hyperlipidemia: She is taking her Zetia daily. She is compliant with a low fat/cholesterol diet.    GERD:  She is taking her medication daily as prescribed.  She has  GERD symptoms daily.   Insomnia: She is taking nortriptyline nightly.  She gets about 4 hours of sleep at night with the medication.  B12 deficiency: She gets B12 injections monthly, but has missed it for the past few visits.     Medications and allergies reviewed with patient and updated if appropriate.  Patient Active Problem List   Diagnosis Date Noted  . Arthralgia 08/01/2017  . COPD with asthma ([place]) 05/04/2017  . Angina pectoris ([place]) 01/05/2017  . Insomnia 11/23/2016  . Chronic nonintractable headache 11/23/2016  . Pancreatic insufficiency 10/28/2016  . Carotid artery disease ([place]) 05/19/2016  . Fatigue 05/19/2016  . Anemia 03/27/2016  . Acute bronchitis with COPD ([place]) 12/05/2015  . Type 1 diabetes mellitus ([place]) 07/21/2015  . B12 deficiency 03/31/2015   . OSA (obstructive sleep apnea) 11/20/2014  . Fecal incontinence 08/28/2014  . 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 Falls 08/28/2014  . Neck pain of over 3 months duration 08/28/2014  . H/O total knee replacement 02/20/2014  . Obesity (BMI 30-39.9) 11/12/2013  . Chronic diastolic CHF (congestive heart failure) ([place]) 11/17/2012  . Meniscus, lateral, anterior horn derangement 11/10/2011  . Medial meniscus, posterior horn derangement 11/10/2011  . Osteoarthritis of right knee 11/10/2011  . Vertigo 10/05/2011  . HTN (hypertension), benign 10/05/2011  . Coronary artery disease 08/27/2010  . MUSCLE CRAMPS 12/29/2009  . History of myocardial infarction 11/19/2009  . Extrinsic asthma 11/19/2009  . GERD 11/19/2009  . Cough 11/19/2009  . Hyperlipidemia 11/18/2009    Current Outpatient Medications on File Prior to Visit  Medication Sig Dispense Refill  . albuterol (PROVENTIL HFA;VENTOLIN HFA) 108 (90 BASE) MCG/ACT inhaler Inhale 2 puffs into the lungs every 6 (six) hours as needed. For shortness of breath. 1 Inhaler 0  . albuterol (PROVENTIL) (2.5 MG/3ML) 0.083% nebulizer solution Take 3 mLs (2.5 mg total) by nebulization every 6 (six) hours as needed for wheezing or shortness of breath. 75 mL 12  . alum & mag hydroxide-simeth (MAALOX/MYLANTA) 200-200-20 MG/5ML suspension Take 30 mLs as needed by mouth for indigestion or heartburn.    Marland Kitchen aspirin EC 81 MG tablet Take 1 tablet (81 mg total) by mouth  daily.    . baclofen (LIORESAL) 20 MG tablet Take 1 tablet (20 mg total) by mouth 3 (three) times daily. Need office visit for more refills 90 tablet 0  . Blood Glucose Monitoring Suppl (ONE TOUCH ULTRA MINI) w/Device KIT 3 (three) times daily. for testing  0  . calcium carbonate (TUMS - DOSED IN MG ELEMENTAL CALCIUM) 500 MG chewable tablet Chew 2 tablets daily as needed by mouth for indigestion or heartburn.    . Cholecalciferol (VITAMIN D) 2000 UNITS tablet Take 2,000 Units by mouth daily.     .  clonazePAM (KLONOPIN) 0.5 MG tablet Take 0.5 mg daily by mouth.    . CREON 24000-76000 units CPEP TAKE 2 CAPSULES BY MOUTH 3 TIMES A DAY WITH MEALS (TAK 1 CAPSULE WITH A SNACK) 210 capsule 1  . dapagliflozin propanediol (FARXIGA) 5 MG TABS tablet Take 5 mg by mouth daily. 30 tablet 5  . diclofenac sodium (VOLTAREN) 1 % GEL Apply 2 g topically 4 (four) times daily as needed (muscle pain).    Marland Kitchen diltiazem (CARDIZEM CD) 240 MG 24 hr capsule TAKE 1 CAPSULE BY MOUTH EVERY DAY 90 capsule 2  . ezetimibe (ZETIA) 10 MG tablet Take 10 mg by mouth daily.  6  . famotidine (PEPCID) 40 MG tablet Take 1 tablet (40 mg total) by mouth daily as needed. 90 tablet 1  . fexofenadine (ALLEGRA) 180 MG tablet Take 1 tablet (180 mg total) by mouth daily. (Patient taking differently: Take 180 mg by mouth daily as needed. ) 30 tablet 6  . fluticasone (FLONASE) 50 MCG/ACT nasal spray Place 2 sprays into both nostrils daily. 16 g 6  . Fluticasone-Salmeterol (ADVAIR DISKUS) 250-50 MCG/DOSE AEPB Inhale 1 puff into the lungs 2 (two) times daily. 60 each 5  . insulin lispro protamine-insulin lispro (HUMALOG 75/25) (75-25) 100 UNIT/ML SUSP Inject 15-22 Units into the skin See admin instructions. Takes 16 units in the morning and 25  units with supper    . isosorbide mononitrate (IMDUR) 60 MG 24 hr tablet Take 1.5 tablets (90 mg total) by mouth daily. 135 tablet 1  . loperamide (IMODIUM) 2 MG capsule Take 2 mg by mouth as needed for diarrhea or loose stools.     . meclizine (ANTIVERT) 25 MG tablet Take 25 mg by mouth 2 (two) times daily as needed for dizziness.     . metoprolol succinate (TOPROL-XL) 100 MG 24 hr tablet TAKE 1 TABLET BY MOUTH EVERY DAY 30 tablet 0  . montelukast (SINGULAIR) 5 MG chewable tablet Chew 1 tablet (5 mg total) by mouth at bedtime. 30 tablet 3  . Multiple Vitamin (MULTIVITAMIN WITH MINERALS) TABS Take 1 tablet by mouth daily.    . Nebulizers (COMPRESSOR/NEBULIZER) MISC Use with albuterol 1 each 0  .  nitroGLYCERIN (NITROSTAT) 0.4 MG SL tablet Place 0.4 mg under the tongue every 5 (five) minutes as needed for chest pain.    . nortriptyline (PAMELOR) 75 MG capsule TAKE 1 CAPSULE (75 MG TOTAL) BY MOUTH AT BEDTIME. 90 capsule 2  . ONE TOUCH ULTRA TEST test strip CHECK BLOOD SUGAR TWICE A DAY DX: E11.9  3  . ONETOUCH DELICA LANCETS 83F MISC 3 (three) times daily. for testing  0  . pantoprazole (PROTONIX) 40 MG tablet Take 1 tablet (40 mg total) by mouth 2 (two) times daily before a meal. 180 tablet 3  . Spacer/Aero-Holding Chambers (AEROCHAMBER MV) inhaler Use as instructed 1 each 0  . Tiotropium Bromide Monohydrate (SPIRIVA RESPIMAT) 2.5  MCG/ACT AERS Inhale 2 puffs into the lungs daily. 1 Inhaler 0  . furosemide (LASIX) 40 MG tablet Take 1 tablet (40 mg total) by mouth daily. 90 tablet 3   Current Facility-Administered Medications on File Prior to Visit  Medication Dose Route Frequency Provider Last Rate Last Dose  . cyanocobalamin ((VITAMIN B-12)) injection 1,000 mcg  1,000 mcg Intramuscular Q30 days Binnie Rail, MD   1,000 mcg at 03/26/16 1658    Past Medical History:  Diagnosis Date  . Adenomatous colon polyp   . Allergy   . Anxiety   . Asthma   . Chest pain   . Chronic diastolic CHF (congestive heart failure) ([place])   . COPD (chronic obstructive pulmonary disease) ([place])    pt is unsure if has been officially diagnosed  . Coronary artery disease    CABG '09- cathed 12/09, 9/10, 6/11, 3/14 and 12/13/16- medical Rx  . Diabetes mellitus   . GERD (gastroesophageal reflux disease)   . Hiatal hernia   . Hyperlipidemia   . Hypertension   . Myocardial infarction ([place]) 2009  . PONV (postoperative nausea and vomiting)   . Schatzki's ring   . Shoulder injury    resolved after shoulder surgery  . Sleep apnea    not on cpap    Past Surgical History:  Procedure Laterality Date  . ABDOMINAL HYSTERECTOMY    . APPENDECTOMY     came out with Hysterectomy  . CARDIAC CATHETERIZATION   07/23/2009   EF 60%  . CARDIAC CATHETERIZATION  10/11/2008  . CARDIAC CATHETERIZATION  03/01/2007   EF 75-80%  . CARDIOVASCULAR STRESS TEST  11/15/2007   EF 60%  . COLONOSCOPY    . CORONARY ARTERY BYPASS GRAFT     SEVERELY DISEASED SAPHENOUS VEIN GRAFT TO THE RIGHT CORONARY ARTERY BUT WITH FAIRLY WELL PRESERVED FLOW TO THE DISTAL RIGHT CORONARY ARTERY FROM THE NATIVE CIRCULATION-RESTART  CATH IN JUNE 2000, REVEALS MILD/MODERATE  CAD WITH GOOD FLOW DOWN HER LAD  . ESOPHAGOGASTRODUODENOSCOPY    . EYE SURGERY     bilateral cataract surgery with lens implant  . LEFT HEART CATH AND CORS/GRAFTS ANGIOGRAPHY N/A 12/14/2016   Procedure: LEFT HEART CATH AND CORS/GRAFTS ANGIOGRAPHY;  Surgeon: Jettie Booze, MD;  Location: [facility];  Service: Cardiovascular;  Laterality: N/A;  . lense removal Left   . POLYPECTOMY    . ROTATOR CUFF REPAIR     right and left  . TONSILLECTOMY     age 68  . 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 tumor removed kidney    . UPPER GASTROINTESTINAL ENDOSCOPY    . US ECHOCARDIOGRAPHY  03/08/2008   EF 55-60%    Social History   Socioeconomic History  . Marital status: Widowed    Spouse name: Not on file  . Number of children: 4  . Years of education: Designer, jewellery  . Highest education level: Not on file  Occupational History  . Occupation: Retired  Scientific laboratory technician  . Financial resource strain: Very hard  . Food insecurity    Worry: Sometimes true    Inability: Sometimes true  . Transportation needs    Medical: No    Non-medical: No  Tobacco Use  . Smoking status: Former Smoker    Packs/day: 0.50    Years: 21.00    Pack years: 10.50  Types: Cigarettes    Start date: 02/09/1955    Quit date: 02/09/1976    Years since quitting: 42.5  . Smokeless tobacco: Never Used  Substance and Sexual Activity  . Alcohol use:  No    Alcohol/week: 0.0 standard drinks  . Drug use: No  . Sexual activity: Never  Lifestyle  . Physical activity    Days per week: 3 days    Minutes per session: 40 min  . Stress: Only a little  Relationships  . Social connections    Talks on phone: More than three times a week    Gets together: More than three times a week    Attends religious service: More than 4 times per year    Active member of club or organization: Yes    Attends meetings of clubs or organizations: More than 4 times per year    Relationship status: Widowed  Other Topics Concern  . Not on file  Social History Narrative   Lives alone.  One story home.  Has 4 children.  Education: doctorate in theology.    Caffeine use: Drinks 1 cup coffee/day      Originally from [place]. Previously has lived in [place]. Prior travel to [place], [place], [place], [place], [place], [place], [place], & [place]. Previously worked in [employer] She has a dog currently. No bird, mold, or hot tub exposure. She also pastors a church.     Family History  Problem Relation Age of Onset  . Heart disease Maternal Grandfather   . Heart failure Maternal Grandfather   . Diabetes Maternal Grandfather   . Heart attack Father   . Diabetes Mother   . Rheum arthritis Sister   . Emphysema Paternal Uncle   . Esophageal cancer Brother 59       she said he was born with it  . Emphysema Paternal Aunt   . Neuropathy Neg Hx   . Multiple sclerosis Neg Hx   . Colon cancer Neg Hx   . Colon polyps Neg Hx   . Rectal cancer Neg Hx   . Stomach cancer Neg Hx     Review of Systems  Constitutional: Positive for fatigue. Negative for chills and fever.  HENT: Negative for ear pain, sinus pain and sore throat.   Respiratory: Positive for cough (prodcutive ), shortness of breath and wheezing.   Cardiovascular: Positive for chest pain (sharp pain that last for 5 min, comes every two hrs - occurs with actiivity) and leg swelling (at end of day x 3 months).  Negative for palpitations.  Neurological: Positive for dizziness (two occasions), light-headedness (x last 2-3 weeks) and headaches.       Objective:   Vitals:   08/01/18 1413  BP: 128/68  Resp: 16  Temp: 98.6 F (37 C)   BP Readings from Last 3 Encounters:  08/01/18 128/68  08/01/18 110/78  06/29/18 128/82   Wt Readings from Last 3 Encounters:  08/01/18 209 lb (94.8 kg)  08/01/18 208 lb 3.2 oz (94.4 kg)  06/29/18 207 lb (93.9 kg)   Body mass index is 38.23 kg/m.   Physical Exam    Constitutional: Appears well-developed and well-nourished. No distress.  HENT:  Head: Normocephalic and atraumatic.  Neck: Neck supple. No tracheal deviation present. No thyromegaly present.  No cervical lymphadenopathy Cardiovascular: Normal rate, regular rhythm and normal heart sounds.   No murmur heard. No carotid bruit .  No edema Pulmonary/Chest: Effort normal and breath sounds normal. No respiratory distress.  No has no wheezes. No rales.  Skin: Skin is warm and dry. Not diaphoretic.  Psychiatric: Normal mood and affect. Behavior is normal.      Assessment & Plan:    See Problem List for Assessment and Plan of chronic medical problems.

## 2018-08-01 ENCOUNTER — Ambulatory Visit (INDEPENDENT_AMBULATORY_CARE_PROVIDER_SITE_OTHER): Payer: Medicare Other | Admitting: Pulmonary Disease

## 2018-08-01 ENCOUNTER — Other Ambulatory Visit: Payer: Self-pay

## 2018-08-01 ENCOUNTER — Encounter: Payer: Self-pay | Admitting: Pulmonary Disease

## 2018-08-01 ENCOUNTER — Ambulatory Visit (INDEPENDENT_AMBULATORY_CARE_PROVIDER_SITE_OTHER): Payer: Medicare Other

## 2018-08-01 ENCOUNTER — Encounter: Payer: Self-pay | Admitting: Internal Medicine

## 2018-08-01 ENCOUNTER — Ambulatory Visit (INDEPENDENT_AMBULATORY_CARE_PROVIDER_SITE_OTHER): Payer: Medicare Other | Admitting: Internal Medicine

## 2018-08-01 VITALS — BP 110/78 | HR 67 | Temp 98.0°F | Ht 62.0 in | Wt 208.2 lb

## 2018-08-01 VITALS — BP 128/68 | Temp 98.6°F | Resp 16 | Ht 62.0 in | Wt 209.0 lb

## 2018-08-01 DIAGNOSIS — J449 Chronic obstructive pulmonary disease, unspecified: Secondary | ICD-10-CM

## 2018-08-01 DIAGNOSIS — G4709 Other insomnia: Secondary | ICD-10-CM

## 2018-08-01 DIAGNOSIS — E1059 Type 1 diabetes mellitus with other circulatory complications: Secondary | ICD-10-CM

## 2018-08-01 DIAGNOSIS — K219 Gastro-esophageal reflux disease without esophagitis: Secondary | ICD-10-CM

## 2018-08-01 DIAGNOSIS — I10 Essential (primary) hypertension: Secondary | ICD-10-CM

## 2018-08-01 DIAGNOSIS — E538 Deficiency of other specified B group vitamins: Secondary | ICD-10-CM

## 2018-08-01 DIAGNOSIS — I25709 Atherosclerosis of coronary artery bypass graft(s), unspecified, with unspecified angina pectoris: Secondary | ICD-10-CM | POA: Diagnosis not present

## 2018-08-01 DIAGNOSIS — D649 Anemia, unspecified: Secondary | ICD-10-CM

## 2018-08-01 DIAGNOSIS — E785 Hyperlipidemia, unspecified: Secondary | ICD-10-CM

## 2018-08-01 MED ORDER — SPIRIVA RESPIMAT 2.5 MCG/ACT IN AERS: 2.0000 | INHALATION_SPRAY | Freq: Every day | RESPIRATORY_TRACT | 6 refills | Status: DC

## 2018-08-01 MED ORDER — LANSOPRAZOLE 30 MG PO CPDR: 30.0000 mg | DELAYED_RELEASE_CAPSULE | Freq: Two times a day (BID) | ORAL | 5 refills | Status: DC

## 2018-08-01 MED ORDER — CYANOCOBALAMIN 1000 MCG/ML IJ SOLN
1000.0000 ug | Freq: Once | INTRAMUSCULAR | Status: AC
  Administered 2018-08-01: 1000 ug via INTRAMUSCULAR

## 2018-08-01 MED ORDER — SPIRIVA RESPIMAT 2.5 MCG/ACT IN AERS: 2.0000 | INHALATION_SPRAY | Freq: Every day | RESPIRATORY_TRACT | 0 refills | Status: DC

## 2018-08-01 NOTE — Assessment & Plan Note (Signed)
Only gets about 4 hrs at night Taking nortriptyline

## 2018-08-01 NOTE — Progress Notes (Signed)
Your chest x-ray results of come back.  Showing no acute changes.  No plan of care changes at this time.  Keep follow-up appointment.    Follow-up with our office if symptoms worsen or you do not feel like you are improving under her current regimen.  It was a pleasure taking care of you,  Brian Mack, FNP 

## 2018-08-01 NOTE — Assessment & Plan Note (Signed)
Iron def anemia Taking iron Cbc, iron panel

## 2018-08-01 NOTE — Patient Instructions (Addendum)
Xray today   Sputum culture - 3ways   Continue Advair 250 2 puffs every 12 hours rinse mouth out after use Take this every day no matter what  Continue Spiriva Respimat 2.5 >>> 2 puffs daily >>> Do this every day >>>This is not a rescue inhaler >>>sample today  >>>prescription sent to pharmacy   Only use your albuterol as a rescue medication to be used if you can't catch your breath by resting or doing a relaxed purse lip breathing pattern.  - The less you use it, the better it will work when you need it. - Ok to use up to 2 puffs every 4 hours if you must but call for immediate appointment if use goes up over your usual need - Don't leave home without it !! (think of it like the spare tire for your car)     I am very concerned regarding your blood sugars.  I would like for you to follow-up with your endocrinologist today as well as primary care and let them know how your blood sugars are running.   Continue Singulair  Continue Allegra    If your blood sugars remain elevated or you start having worsened fatigue, vision problems, headaches, or acute worsening shortness of breath you need to present to an emergency room for further evaluation.   Return in about 2 months (around 10/01/2018), or if symptoms worsen or fail to improve, for Follow up with Wyn Quaker FNP-C, Follow up with Dr. Lake Bells.   Coronavirus (COVID-19) Are you at risk?  Are you at risk for the Coronavirus (COVID-19)?  To be considered HIGH RISK for Coronavirus (COVID-19), you have to meet the following criteria:  . Traveled to [place], [place], [place], [place] or [place]; or in the [place] to [place], [place], [place], or [place]; and have fever, cough, and shortness of breath within the last 2 weeks of travel OR . Been in close contact with a person diagnosed with COVID-19 within the last 2 weeks and have fever, cough, and shortness of breath . IF YOU DO NOT MEET THESE CRITERIA,  YOU ARE CONSIDERED LOW RISK FOR COVID-19.  What to do if you are HIGH RISK for COVID-19?  Marland Kitchen If you are having a medical emergency, call 911. . Seek medical care right away. Before you go to a doctor's office, urgent care or emergency department, call ahead and tell them about your recent travel, contact with someone diagnosed with COVID-19, and your symptoms. You should receive instructions from your physician's office regarding next steps of care.  . When you arrive at healthcare provider, tell the healthcare staff immediately you have returned from visiting [place], [place], [place], [place] or [place]; or traveled in the [place] to [place], [place], [place], or [place]; in the last two weeks or you have been in close contact with a person diagnosed with COVID-19 in the last 2 weeks.   . Tell the health care staff about your symptoms: fever, cough and shortness of breath. . After you have been seen by a medical provider, you will be either: o Tested for (COVID-19) and discharged home on quarantine except to seek medical care if symptoms worsen, and asked to  - Stay home and avoid contact with others until you get your results (4-5 days)  - Avoid travel on public transportation if possible (such as bus, train, or airplane) or o Sent to the Emergency Department by EMS for evaluation, COVID-19 testing, and  possible admission depending on your condition and test results.  What to do if you are LOW RISK for COVID-19?  Reduce your risk of any infection by using the same precautions used for avoiding the common cold or flu:  Marland Kitchen Wash your hands often with soap and warm water for at least 20 seconds.  If soap and water are not readily available, use an alcohol-based hand sanitizer with at least 60% alcohol.  . If coughing or sneezing, cover your mouth and nose by coughing or sneezing into the elbow areas of your shirt or coat, into a tissue or into your sleeve (not your hands). . Avoid shaking  hands with others and consider head nods or verbal greetings only. . Avoid touching your eyes, nose, or mouth with unwashed hands.  . Avoid close contact with people who are sick. . Avoid places or events with large numbers of people in one location, like concerts or sporting events. . Carefully consider travel plans you have or are making. . If you are planning any travel outside or inside the [place], visit the CDC's Travelers' Health webpage for the latest health notices. . If you have some symptoms but not all symptoms, continue to monitor at home and seek medical attention if your symptoms worsen. . If you are having a medical emergency, call 911.   [place] / e-Visit: [email or web address]         [facility]: [facility]: [phone]                   [facility]: [phone]           It is flu season:   >>> Best ways to protect herself from the flu: Receive the yearly flu vaccine, practice good hand hygiene washing with soap and also using hand sanitizer when available, eat a nutritious meals, get adequate rest, hydrate appropriately   Please contact the office if your symptoms worsen or you have concerns that you are not improving.   Thank you for choosing [facility] for your healthcare, and for allowing [place] to partner with you on your healthcare journey. I am thankful to be able to provide care to you today.   Wyn Quaker FNP-C

## 2018-08-01 NOTE — Assessment & Plan Note (Signed)
Not controlled with omeprazole bid Will change to lanzoprazole BID and start pepcid daily Call if symptoms are not improved

## 2018-08-01 NOTE — Progress Notes (Signed)
_0  ID: [name], female    DOB: 1940-04-07, 78 y.o.   MRN: [id number]  Chief Complaint  Patient presents with  . Follow-up    COPD follow up, 4 weeks     Referring provider: Binnie Rail, MD  HPI:  78 year old female former smoker followed in our office for COPD asthma overlap syndrome  PMH: Hypertension, type 1 diabetes, pancreatic insufficiency, GERD, hyperlipidemia Smoker/ Smoking History: Former smoker.  10.5-pack-year smoking history.  Quit 1978. Maintenance: Advair 250, Spiriva Resp 2.5  Pt of: [name]  08/01/2018  - Visit   78 year old female former smoker followed in our office for COPD asthma overlap syndrome.  Patient presenting to our office today as a 4-week follow-up.  Patient was recently started on Spiriva Respimat 2.5 in addition to her Advair 250.  Patient reports that the Spiriva Respimat 2.5 has been helpful.  She also has been using her albuterol nebulizer or rescue inhaler every 4-6 hours to help with her breathing.  She continues to have a productive cough.  She reports that symptoms did improve after her doxycycline course but returned about 2 weeks ago.  Unfortunately patient did not complete her chest x-ray as instructed 4 weeks ago.  She will receive a chest x-ray today.  MMRC - Breathlessness Score 3 - I stop for breath after walking about 100 yards or after a few minutes on level ground (isle at grocery store is 186f)     Tests:   PFT 12/23/16: FVC 1.44 L (72%) FEV1 0.96 L (63%) FEV1/FVC 0.67 FEF 25-75 0.55 L (42%) negative bronchodilator response TLC 3.85 L (78%) RV 102% DLCO uncorrected 45% 06/04/15: FVC 1.57 L (77%) FEV1 1.01 L (64%) FEV1/FVC 0.65 FEF 25-75 0.54 L (39%) negative bronchodilator response 02/21/15: FVC 1.60 L (78%) FEV1 1.03 L (65%) FEV1/FVC 0.64 FEF 25-75 0.51 L (37%) positive bronchodilator response 11/20/14: FVC 1.44 L (70%) FEV1 0.91 L (57%) FEV1/FVC 0.63 FEF 25-75 0.47 L (33%) positive bronchodilator response TLC  3.92 L (79%) RV 103% DLCO uncorrected 64% 11/12/13: FVC 1.57 L (74%) FEV1 1.09 L (67%) FEV1/FVC 0.70 FEF 25-75 0.72 L (50%) negative bronchodilator response 08/04/11: FVC 1.61 L (60%) FEV1 0.89 L (48%) FEV1/FVC 0.56 FEF 25-75 0.29 L (14%) positive bronchodilator response TLC 3.67 L (80%) RV 103% ERV 46% DLCO corrected 38%  6MWT 11/20/14: Walked 336 meters / Baseline Sat 100% on RA / Nadir Sat 99% on RA (pt c/o pain in her right neck w/ dyspnea)  IMAGING November 2018 CT angiogram chest images showing centrilobular predominant emphysema, mild in an upper lobe distribution, no PE, there is a 5 mm right upper lobe nodule. 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 LIMA to LAD is patent.  Prox RCA lesion is 25% stenosed. SVG to PDA is occluded.  Mid RCA lesion is 25% stenosed.  Circumflex is a large tortuous vessel.  Ost 1st Diag lesion is 100% stenosed. SVG to diagonal is patent.  The left ventricular systolic function is normal.  LV end diastolic pressure is normal.  The left ventricular ejection fraction is 55-65% by visual estimate.  There is no aortic valve stenosis.  TTE (10/06/11): LV normal in size. Normal regional wall motion. EF 60-65%. Grade 1 diastolic dysfunction. LA & RA normal in size. 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 RV normal in size and function. RVSP 19 mmHg. No aortic stenosis or regurgitation. No mitral stenosis or regurgitation. No  pulmonic stenosis. Trivial tricuspid regurgitation. No pericardial effusion.  LABS 03/13/15 Procalcitonin: <0.1 CBC: 10.9/11.8/37.3/245  10/08/14 IgG: 1060 IgM: 112 IgA: 272 IgE: 5 CBC: Differential: Eos 0.3 (4.5%)  RAST panel: Negative Aspergillus antigen: <0.1  1/26/9 ABG: 7.39/44/113  Sleep study: 03/2017 Home sleep study showed no sleep apnea but her oxygen level dropped to 2L [place]  FENO:  Lab Results  Component Value Date   NITRICOXIDE 7  05/05/2017    PFT: PFT Results Latest Ref Rng & Units 12/23/2016 06/04/2015 02/21/2015 11/20/2014 11/12/2013  FVC-Pre L 1.44 1.57 1.60 1.44 1.57  FVC-Predicted Pre % 72 77 78 70 74  FVC-Post L 1.50 1.63 1.82 1.73 1.66  FVC-Predicted Post % 75 80 89 84 79  Pre FEV1/FVC % % 67 65 64 63 70  Post FEV1/FCV % % 68 69 67 70 71  FEV1-Pre L 0.96 1.01 1.03 0.91 1.09  FEV1-Predicted Pre % 63 64 65 57 67  FEV1-Post L 1.03 1.12 1.23 1.21 1.17  DLCO UNC% % 45 - - 64 -  DLCO COR %Predicted % 73 - - 98 -  TLC L 3.85 - - 3.92 -  TLC % Predicted % 78 - - 79 -  RV % Predicted % 102 - - 103 -    Imaging: No results found.    Specialty Problems      Pulmonary Problems   Cough    Qualifier: Diagnosis of  By: [name] Gaskins MD, [name]       Extrinsic asthma    Severe persistent ASthma 08/04/2011  PFTs:  FeV1 48%  FVC 60%  25% improvement with BD TLC 80%  DLCO 38% ONO 08/08/2012 >>+nocturnal desats >40% <88% O2 sat >begin O2 2l/m At bedtime         OSA (obstructive sleep apnea)    Following with neurology      Acute bronchitis with COPD ([place])   COPD with asthma ([place])    12/23/16: FVC 1.44 L (72%) FEV1 0.96 L (63%) FEV1/FVC 0.67 FEF 25-75 0.55 L (42%) negative bronchodilator response TLC 3.85 L (78%) RV 102% DLCO uncorrected 45%  November 2018 CT angiogram chest images showing centrilobular predominant emphysema, mild in an upper lobe distribution, no PE, there is a 5 mm right upper lobe nodule. 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 Amlodipine Other (See Comments)    hallucinations   . Banana Nausea And Vomiting    Stomach pumped  . Co Q10 [Coenzyme Q10]     Body cramps  . Codeine     Hallucinate, loose identity and don't know who I am  . Metformin And Related Nausea And Vomiting  . Morphine Other (See Comments)    Can not function, it immobilizes me   . Pentazocine Nausea  And Vomiting  . Pravastatin     Hands locked up  . Repatha [Evolocumab]     myalgias  . Statins     Muscle cramps  . Sulfa Antibiotics Swelling  . Sulfonamide Derivatives Swelling  . Tramadol Hcl     Immunization History  Administered Date(s) Administered  . Influenza Split 12/15/2011, 11/08/2012, 11/19/2014  . Influenza Whole 10/09/2008  . Influenza, High Dose Seasonal PF 11/19/2015, 11/23/2016, 10/26/2017  . Influenza-Unspecified 10/29/2013  . Pneumococcal Conjugate-13 09/03/2015  . Pneumococcal Polysaccharide-23 02/09/2004  . Pneumococcal-Unspecified 02/09/2012    Past Medical History:  Diagnosis  Date  . Adenomatous colon polyp   . Allergy   . Anxiety   . Asthma   . Chest pain   . Chronic diastolic CHF (congestive heart failure) ([place])   . COPD (chronic obstructive pulmonary disease) ([place])    pt is unsure if has been officially diagnosed  . Coronary artery disease    CABG '09- cathed 12/09, 9/10, 6/11, 3/14 and 12/13/16- medical Rx  . Diabetes mellitus   . GERD (gastroesophageal reflux disease)   . Hiatal hernia   . Hyperlipidemia   . Hypertension   . Myocardial infarction ([place]) 2009  . PONV (postoperative nausea and vomiting)   . Schatzki's ring   . Shoulder injury    resolved after shoulder surgery  . Sleep apnea    not on cpap    Tobacco History: Social History   Tobacco Use  Smoking Status Former Smoker  . Packs/day: 0.50  . Years: 21.00  . Pack years: 10.50  . Types: Cigarettes  . Start date: 02/09/1955  . Quit date: 02/09/1976  . Years since quitting: 42.5  Smokeless Tobacco Never Used   Counseling given: Yes  Continue to not smoke  Outpatient Encounter Medications as of 08/01/2018  Medication Sig  . albuterol (PROVENTIL HFA;VENTOLIN HFA) 108 (90 BASE) MCG/ACT inhaler Inhale 2 puffs into the lungs every 6 (six) hours as needed. For shortness of breath.  Marland Kitchen albuterol (PROVENTIL) (2.5 MG/3ML) 0.083% nebulizer solution Take 3 mLs (2.5 mg total) by  nebulization every 6 (six) hours as needed for wheezing or shortness of breath.  Marland Kitchen alum & mag hydroxide-simeth (MAALOX/MYLANTA) 200-200-20 MG/5ML suspension Take 30 mLs as needed by mouth for indigestion or heartburn.  Marland Kitchen aspirin EC 81 MG tablet Take 1 tablet (81 mg total) by mouth daily.  . baclofen (LIORESAL) 20 MG tablet Take 1 tablet (20 mg total) by mouth 3 (three) times daily. Need office visit for more refills  . Blood Glucose Monitoring Suppl (ONE TOUCH ULTRA MINI) w/Device KIT 3 (three) times daily. for testing  . calcium carbonate (TUMS - DOSED IN MG ELEMENTAL CALCIUM) 500 MG chewable tablet Chew 2 tablets daily as needed by mouth for indigestion or heartburn.  . Cholecalciferol (VITAMIN D) 2000 UNITS tablet Take 2,000 Units by mouth daily.   . clonazePAM (KLONOPIN) 0.5 MG tablet Take 0.5 mg daily by mouth.  . CREON 24000-76000 units CPEP TAKE 2 CAPSULES BY MOUTH 3 TIMES A DAY WITH MEALS (TAK 1 CAPSULE WITH A SNACK)  . dapagliflozin propanediol (FARXIGA) 5 MG TABS tablet Take 5 mg by mouth daily.  . diclofenac sodium (VOLTAREN) 1 % GEL Apply 2 g topically 4 (four) times daily as needed (muscle pain).  Marland Kitchen diltiazem (CARDIZEM CD) 240 MG 24 hr capsule TAKE 1 CAPSULE BY MOUTH EVERY DAY  . ezetimibe (ZETIA) 10 MG tablet Take 10 mg by mouth daily.  . famotidine (PEPCID) 40 MG tablet Take 1 tablet (40 mg total) by mouth daily as needed.  . fexofenadine (ALLEGRA) 180 MG tablet Take 1 tablet (180 mg total) by mouth daily. (Patient taking differently: Take 180 mg by mouth daily as needed. )  . fluticasone (FLONASE) 50 MCG/ACT nasal spray Place 2 sprays into both nostrils daily.  . Fluticasone-Salmeterol (ADVAIR DISKUS) 250-50 MCG/DOSE AEPB Inhale 1 puff into the lungs 2 (two) times daily.  . insulin lispro protamine-insulin lispro (HUMALOG 75/25) (75-25) 100 UNIT/ML SUSP Inject 15-22 Units into the skin See admin instructions. Takes 16 units in the morning and 25  units with supper  . isosorbide  mononitrate (IMDUR) 60 MG 24 hr tablet Take 1.5 tablets (90 mg total) by mouth daily.  Marland Kitchen loperamide (IMODIUM) 2 MG capsule Take 2 mg by mouth as needed for diarrhea or loose stools.   . meclizine (ANTIVERT) 25 MG tablet Take 25 mg by mouth 2 (two) times daily as needed for dizziness.   . metoprolol succinate (TOPROL-XL) 100 MG 24 hr tablet TAKE 1 TABLET BY MOUTH EVERY DAY  . montelukast (SINGULAIR) 5 MG chewable tablet Chew 1 tablet (5 mg total) by mouth at bedtime.  . Multiple Vitamin (MULTIVITAMIN WITH MINERALS) TABS Take 1 tablet by mouth daily.  . Nebulizers (COMPRESSOR/NEBULIZER) MISC Use with albuterol  . nitroGLYCERIN (NITROSTAT) 0.4 MG SL tablet Place 0.4 mg under the tongue every 5 (five) minutes as needed for chest pain.  . nortriptyline (PAMELOR) 75 MG capsule TAKE 1 CAPSULE (75 MG TOTAL) BY MOUTH AT BEDTIME.  . ONE TOUCH ULTRA TEST test strip CHECK BLOOD SUGAR TWICE A DAY DX: E11.9  . ONETOUCH DELICA LANCETS 16W MISC 3 (three) times daily. for testing  . pantoprazole (PROTONIX) 40 MG tablet Take 1 tablet (40 mg total) by mouth 2 (two) times daily before a meal.  . Spacer/Aero-Holding Chambers (AEROCHAMBER MV) inhaler Use as instructed  . Tiotropium Bromide Monohydrate (SPIRIVA RESPIMAT) 2.5 MCG/ACT AERS Inhale 2 puffs into the lungs daily.  . furosemide (LASIX) 40 MG tablet Take 1 tablet (40 mg total) by mouth daily.  . Tiotropium Bromide Monohydrate (SPIRIVA RESPIMAT) 2.5 MCG/ACT AERS Inhale 2 puffs into the lungs daily.  . [DISCONTINUED] Tiotropium Bromide Monohydrate (SPIRIVA RESPIMAT) 2.5 MCG/ACT AERS Inhale 2 puffs into the lungs daily.   Facility-Administered Encounter Medications as of 08/01/2018  Medication  . cyanocobalamin ((VITAMIN B-12)) injection 1,000 mcg     Review of Systems  Review of Systems  Constitutional: Positive for fatigue. Negative for chills, fever and unexpected weight change.  HENT: Negative for congestion, postnasal drip, sinus pressure and sinus  pain.   Respiratory: Positive for cough, chest tightness, shortness of breath and wheezing.   Cardiovascular: Negative for chest pain and palpitations.  Gastrointestinal: Negative for diarrhea, nausea and vomiting.  Musculoskeletal: Negative for arthralgias.  Skin: Negative for color change.  Allergic/Immunologic: Negative for environmental allergies and food allergies.  Neurological: Negative for dizziness, light-headedness and headaches.  Psychiatric/Behavioral: Negative for dysphoric mood. The patient is not nervous/anxious.   All other systems reviewed and are negative.    Physical Exam  BP 110/78 (BP Location: Left Arm, Patient Position: Sitting, Cuff Size: Normal)   Pulse 67   Temp 98 F (36.7 C)   Ht 5' 2" (1.575 m)   Wt 208 lb 3.2 oz (94.4 kg)   LMP  (LMP Unknown)   SpO2 97%   BMI 38.08 kg/m   Wt Readings from Last 5 Encounters:  08/01/18 208 lb 3.2 oz (94.4 kg)  06/29/18 207 lb (93.9 kg)  06/12/18 202 lb 6.4 oz (91.8 kg)  04/17/18 200 lb (90.7 kg)  02/24/18 200 lb 3.2 oz (90.8 kg)     Physical Exam  Constitutional: She is oriented to person, place, and time and well-developed, well-nourished, and in no distress. No distress.  HENT:  Head: Normocephalic and atraumatic.  Right Ear: Hearing, tympanic membrane, external ear and ear canal normal.  Left Ear: Hearing, tympanic membrane, external ear and ear canal normal.  Nose: Mucosal edema and rhinorrhea present.  Mouth/Throat: Uvula is midline and oropharynx is clear and  moist. No oropharyngeal exudate.  Postnasal drip  Eyes: Pupils are equal, round, and reactive to light.  Neck: Normal range of motion. Neck supple.  Cardiovascular: Normal rate, regular rhythm and normal heart sounds.  Pulmonary/Chest: Effort normal and breath sounds normal. No accessory muscle usage. No respiratory distress. She has no decreased breath sounds. She has no wheezes. She has no rhonchi. She has no rales.  Abdominal: Soft. Bowel sounds  are normal. She exhibits no distension. There is no abdominal tenderness.  Musculoskeletal: Normal range of motion.        General: No edema.  Lymphadenopathy:    She has no cervical adenopathy.  Neurological: She is alert and oriented to person, place, and time. Gait normal.  Skin: Skin is warm and dry. She is not diaphoretic. No erythema. Clubbing: Unable to assess, long distal fingernails.  Psychiatric: Mood, memory, affect and judgment normal.  Nursing note and vitals reviewed.     Lab Results:  CBC    Component Value Date/Time   WBC 6.0 12/20/2017 0740   RBC 4.54 12/20/2017 0740   HGB 11.2 (L) 12/20/2017 0740   HGB 10.6 (L) 12/10/2016 1346   HCT 35.1 (L) 12/20/2017 0740   HCT 34.1 12/10/2016 1346   PLT 285.0 12/20/2017 0740   PLT 270 12/10/2016 1346   MCV 77.3 (L) 12/20/2017 0740   MCV 78 (L) 12/10/2016 1346   MCH 23.9 (L) 01/04/2017 1505   MCHC 31.8 12/20/2017 0740   RDW 17.6 (H) 12/20/2017 0740   RDW 16.1 (H) 12/10/2016 1346   LYMPHSABS 2.1 12/20/2017 0740   LYMPHSABS 2.2 06/21/2016 1628   MONOABS 0.5 12/20/2017 0740   EOSABS 0.2 12/20/2017 0740   EOSABS 0.1 06/21/2016 1628   BASOSABS 0.0 12/20/2017 0740   BASOSABS 0.0 06/21/2016 1628    BMET    Component Value Date/Time   NA 143 12/20/2017 0740   NA 144 05/19/2017 1444   K 4.0 12/20/2017 0740   CL 104 12/20/2017 0740   CO2 28 12/20/2017 0740   GLUCOSE 151 (H) 12/20/2017 0740   BUN 22 12/20/2017 0740   BUN 15 05/19/2017 1444   CREATININE 0.83 12/20/2017 0740   CALCIUM 9.0 12/20/2017 0740   GFRNONAA 70 05/19/2017 1444   GFRAA 80 05/19/2017 1444    BNP    Component Value Date/Time   BNP 315.0 (H) 01/04/2017 1505    ProBNP    Component Value Date/Time   PROBNP 128 06/21/2016 1628   PROBNP 158.0 (H) 04/17/2012 1123      Assessment & Plan:   COPD with asthma ([place]) Assessment: 2018 pulmonary function test shows COPD Gold 2, FEV1 0.96 2018 CTA shows centrilobular predominant emphysema  Managed on Advair and Spiriva Respimat 2.5 Patient had peripheral eosinophilia on blood work in the past, RAST panel negative, IgE 5 in 2016 Lung sounds clear on exam today although slightly diminished throughout Patient reports 2 weeks of purulent cough yellow to gray mucus despite recent antibiotic coverage of azithromycin as well as doxycycline  Plan: Sputum culture  Chest x-ray today Continue Advair 250 Continue Spiriva Respimat 2.5 Use rescue inhaler as needed Follow-up with our office in 8 weeks Continue to work diligently on exercise   Type 1 diabetes mellitus ([place]) Assessment: Patient reporting blood sugars are still ranging between 200-400 over the last week Managed by Dr. Michiel Sites her endocrinologist  Plan: Patient needs to contact endocrinology today to notify the blood sugars Patient to complete follow-up with primary care today Continue low-carb  diet Patient would benefit from nutritional therapy or a diabetic educator, patient to discuss with primary care today If patient starts to need prednisone tapers to help manage her COPD we will need to consider starting biologic therapies    Return in about 2 months (around 10/01/2018), or if symptoms worsen or fail to improve, for Follow up with Wyn Quaker FNP-C, Follow up with [name].   Lauraine Rinne, NP 08/01/2018   This appointment was 32 minutes long with over 50% of the time in direct face-to-face patient care, assessment, plan of care, and follow-up.

## 2018-08-01 NOTE — Assessment & Plan Note (Signed)
Check lipid panel  Continue daily statin Regular exercise and healthy diet encouraged  

## 2018-08-01 NOTE — Assessment & Plan Note (Signed)
Monthly B12 injection today.

## 2018-08-01 NOTE — Assessment & Plan Note (Signed)
Per Dr Chalmers Cater Sugars are too high but insulin is being adjusted continue regular exercise and diabetic diet

## 2018-08-01 NOTE — Assessment & Plan Note (Signed)
Having chest pain, SOB ? Cardiac  Or pulm vs GERD Will move up cardio appt to discuss symptoms and r/o cardiac cause

## 2018-08-01 NOTE — Assessment & Plan Note (Signed)
BP well controlled Current regimen effective and well tolerated Continue current medications at current doses cmp  

## 2018-08-01 NOTE — Assessment & Plan Note (Signed)
Assessment: 2018 pulmonary function test shows COPD Gold 2, FEV1 0.96 2018 CTA shows centrilobular predominant emphysema Managed on Advair and Spiriva Respimat 2.5 Patient had peripheral eosinophilia on blood work in the past, RAST panel negative, IgE 5 in 2016 Lung sounds clear on exam today although slightly diminished throughout Patient reports 2 weeks of purulent cough yellow to gray mucus despite recent antibiotic coverage of azithromycin as well as doxycycline  Plan: Sputum culture  Chest x-ray today Continue Advair 250 Continue Spiriva Respimat 2.5 Use rescue inhaler as needed Follow-up with our office in 8 weeks Continue to work diligently on exercise

## 2018-08-01 NOTE — Assessment & Plan Note (Signed)
Assessment: Patient reporting blood sugars are still ranging between 200-400 over the last week Managed by Dr. Michiel Sites her endocrinologist  Plan: Patient needs to contact endocrinology today to notify the blood sugars Patient to complete follow-up with primary care today Continue low-carb diet Patient would benefit from nutritional therapy or a diabetic educator, patient to discuss with primary care today If patient starts to need prednisone tapers to help manage her COPD we will need to consider starting biologic therapies

## 2018-08-05 NOTE — Progress Notes (Signed)
Reviewed, agree 

## 2018-08-07 ENCOUNTER — Other Ambulatory Visit: Payer: Medicare Other

## 2018-08-07 DIAGNOSIS — J449 Chronic obstructive pulmonary disease, unspecified: Secondary | ICD-10-CM

## 2018-08-12 ENCOUNTER — Other Ambulatory Visit: Payer: Self-pay | Admitting: Nurse Practitioner

## 2018-08-14 ENCOUNTER — Telehealth: Payer: Self-pay | Admitting: Nurse Practitioner

## 2018-08-14 ENCOUNTER — Telehealth: Payer: Self-pay | Admitting: Pulmonary Disease

## 2018-08-14 ENCOUNTER — Other Ambulatory Visit: Payer: Self-pay | Admitting: Pulmonary Disease

## 2018-08-14 MED ORDER — NYSTATIN 100000 UNIT/ML MT SUSP: 5.0000 mL | Freq: Four times a day (QID) | OROMUCOSAL | 0 refills | Status: DC

## 2018-08-14 MED ORDER — LEVOFLOXACIN 500 MG PO TABS: 500.0000 mg | ORAL_TABLET | Freq: Every day | ORAL | 0 refills | Status: DC

## 2018-08-14 NOTE — Telephone Encounter (Signed)
Pt contacted and made aware of results of sputum cx. Pt aware of rx (2) sent to pharmacy. Pt has f/u appt on 9/22. Nothing further needed.

## 2018-08-14 NOTE — Progress Notes (Signed)
Please contact the patient and let her know that her sputum culture has shown multiple abnormalities.  Sputum culture showing Pseudomonas Aeruginosa as well as E. Coli.  This is multidrug-resistant E. coli.  We will need to treat patient with Levaquin which is sensitive for both.  Need to consider reculturing sputum in a year.  We will also send a prescription for nystatin rinse for patient to use.  Levaquin 500mg  tablet >>> Take 1 500 mg tablet daily for the next 7 days >>> Take with food >>> start probiotic for good gut health >>>avoid rigorous exercise for next 2 weeks   Nystatin 500,000 units suspension /100,000 units/mL >>>5 mL's every 6 hours for 7 days >>>Try to retain nystatin in mouth as long as possible  Please place the orders.   Please ensure pt has follow up with [place] in 2-3 months   Wyn Quaker, FNP

## 2018-08-14 NOTE — Telephone Encounter (Signed)
Pt aware of results of sputum cx which in documented in labs. Rx sent. Nothing further needed.

## 2018-08-14 NOTE — Telephone Encounter (Signed)
Left message regarding patient's appointment on 7/13 with Dr. Acie Fredrickson which is currently scheduled as virtual. Advised patient that Dr. Acie Fredrickson will be in the office that day and to call back to determine whether she will come in for clinic visit or remain virtual.

## 2018-08-16 NOTE — Progress Notes (Signed)
Virtual Visit via Telephone Note The purpose of this virtual visit is to provide medical care while limiting exposure to the novel coronavirus.    Consent was obtained for phone visit:  Yes.   Answered questions that patient had about telehealth interaction:  Yes.   I discussed the limitations, risks, security and privacy concerns of performing an evaluation and management service by telephone. I also discussed with the patient that there may be a patient responsible charge related to this service. The patient expressed understanding and agreed to proceed.  Pt location: Home Physician Location: office Name of referring provider:  Binnie Rail, MD I connected with .[name] at patients initiation/request on 08/17/2018 at 10:50 AM EDT by telephone and verified that I am speaking with the correct person using two identifiers.  Pt MRN:  [id number] Pt DOB:  17-Aug-1940   History of Present Illness:  [name] is a 78 year old woman with coronary artery disease, hypertension, type 2 diabetes mellitus, hyperlipidemia, B12 deficiency and obstructive sleep apnea who follows up for migraines.  UPDATE: Last visit, nortriptyline was increased to 75mg  at bedtime.  Intensity:  Moderate to severe Duration:  Wakes up at 5 AM with headache and resolves by 11 AM.  However, it returns in the evening. Frequency:  Now every 2 to 3 days. Frequency of abortive medication:Tylenol every 4 hours daily Current NSAIDS:ASA 81mg  Current analgesics:Tylenol Current triptans:no Current ergotamine:no Current anti-emetic:Promethazine 25mg  Current muscle relaxants:Baclofen 20mg  twice daily Current anti-anxiolytic:Klonopin Current sleep aide:trazodone Current Antihypertensive medications:Toprol XL 50mg , Cardizem, Lasix, Imdur Current Antidepressant medications:Nortriptyline 75 mg Current Anticonvulsant medications:no Current anti-CGRP:no Current Vitamins/Herbal/Supplements:B12  Current Antihistamines/Decongestants:Allegra, Singulair Other therapy:no  Caffeine:1 cup coffee daily, tea Alcohol:no Smoker:no Diet:hydrates Exercise:yes Depression:no; Anxiety:no Other pain:no Sleep hygiene:Poor.Untreated OSA.   [name] was taken off of CPAP by her prior sleep specialist because of reported noncompliance.  However, [name] insists [name] wore the mask all night and headaches were improved while [name] used the machine.   HISTORY: Onset: [name] has history of migraines as a young woman which resolved after having children. Current headaches started in 2016. Location: [name] has constant holocephalic headache. [name] has severe headache radiating from the base of her neck on the right up Quality: pounding Initial Intensity: 10/10 Aura: no Prodrome: no Postdrome: no Associated symptoms:  None.  No nausea, vomiting, photophobia, phonophobia or visual disturbance. [name] has not had any new worse headache of her life, waking up from sleep Initial Duration: All day Initial Frequency: Daily headache, severe Headache every other day Initial Frequency of abortive medication: daily Triggers:  None Relieving factors:  None Activity: aggravates  Past NSAIDS: Ibuprofen, naproxen (caused increased heart rate) Past analgesics: Excedrin, tramadol and other opioids (adverse reaction) Past abortive triptans: no Past muscle relaxants:Robaxin, Flexeril Past anti-emetic: Promethazine (effective) Past antihypertensive medications: losartan Past antidepressant medications: no Past anticonvulsant medications: Gabapentin 200mg  (drowsiness) Past vitamins/Herbal/Supplements: CoQ10 Past antihistamines/decongestants: no Other past therapy: Physical therapy of neck.  Family history of headache: father  MRI of brain without contrast from 09/16/14 was personally reviewed and revealed partially empty sella but otherwise unremarkable.  MRI of  cervical spine from 09/16/14 was personally reviewed and demonstrated multilevel degenerative changes including left greater than right spurring at C3-C4 causing left foraminal narrowing, severe spurring at C4-C5 causing right moderate foraminal narrowing, and disc bulg and spurring at C6-C7 with moderate bilateral foraminal narrowing. [name] was evaluated by Dr. Nelva Bush for interventional pain (epidural or facet joint injections). He didn't think it  was a good idea given that [name] would have to stop Plavix.    Observations/Objective:   Height 5\' 2"  (1.575 m), weight 187 lb (84.8 kg). No acute distress.  Alert and oriented.  Speech fluent and not dysarthric.  Language intact.    Assessment and Plan:   Headaches, improved.  Suspect secondary to untreated OSA.  Also [name] has tension-type headaches, not intractable    1.  I think it would be appropriate that [name] continue the CPAP as it was effective in controlling her headaches.  I will contact her pulmonologist to see if they can evaluate her for her OSA as well.   2. For preventative management, no change in nortriptyline for now. 3.  Limit use of pain relievers to no more than 2 days out of week to prevent risk of rebound or medication-overuse headache. 4.  Keep headache diary 5.  Exercise, hydration, caffeine cessation, sleep hygiene, monitor for and avoid triggers 6.  Consider:  magnesium citrate 400mg  daily, riboflavin 400mg  daily, and coenzyme Q10 100mg  three times daily 7. Always keep in mind that currently taking a hormone or birth control may be a possible trigger or aggravating factor for migraine. 8. Follow up in 4 months    Follow Up Instructions:    -I discussed the assessment and treatment plan with the patient. The patient was provided an opportunity to ask questions and all were answered. The patient agreed with the plan and demonstrated an understanding of the instructions.   The patient was advised to call back or seek an  in-person evaluation if the symptoms worsen or if the condition fails to improve as anticipated.    Total Time spent in visit with the patient was:  14 minutes [name], DO

## 2018-08-17 ENCOUNTER — Encounter: Payer: Self-pay | Admitting: Neurology

## 2018-08-17 ENCOUNTER — Telehealth: Payer: Self-pay | Admitting: Pulmonary Disease

## 2018-08-17 ENCOUNTER — Telehealth (INDEPENDENT_AMBULATORY_CARE_PROVIDER_SITE_OTHER): Payer: Medicare Other | Admitting: Neurology

## 2018-08-17 ENCOUNTER — Other Ambulatory Visit: Payer: Self-pay

## 2018-08-17 VITALS — Ht 62.0 in | Wt 187.0 lb

## 2018-08-17 DIAGNOSIS — G44229 Chronic tension-type headache, not intractable: Secondary | ICD-10-CM

## 2018-08-17 DIAGNOSIS — G4733 Obstructive sleep apnea (adult) (pediatric): Secondary | ICD-10-CM

## 2018-08-17 NOTE — Telephone Encounter (Signed)
08/17/2018 1121  Jess, can you please contact the patient and see if we can get her scheduled for a follow-up with our office.  Her neurologist sent the message listed below regarding management of headaches.  They believe this may be related to untreated obstructive sleep apnea.  There has been a break in therapy so patient most likely will need a home sleep study.  This can be set up as a tele-visit or in an office visit based off patient's preference.  She will need an Epworth score.  Wyn Quaker, FNP

## 2018-08-17 NOTE — Telephone Encounter (Signed)
-----   Message from Pieter Partridge, DO sent at 08/17/2018 11:11 AM EDT ----- Hello,  I am seeing Ms. Owen for headaches.  She has tension-type headaches but also morning headaches which I believe are related to untreated OSA.  Her previous sleep specialist took away her CPAP due to noncompliance, however Ms. Coulon maintains that she wore it all night.  Regardless, she states headaches were much better controlled while she was using the machine.  Would your group be able to evaluate and possibly restart her CPAP?  If so, would you need a new referral from me or can your office contact her for an appointment?  Thank you, Metta Clines

## 2018-08-18 ENCOUNTER — Telehealth: Payer: Self-pay | Admitting: Cardiovascular Disease

## 2018-08-18 NOTE — Telephone Encounter (Signed)
Spoke with patient who states she wants to keep her appointment as a virtual appointment on Monday July 13. She states she has made arrangements to be available on Monday afternoon and does not want to move her appointment to another day.

## 2018-08-18 NOTE — Telephone Encounter (Signed)

## 2018-08-18 NOTE — Telephone Encounter (Signed)
[id number] x 1 

## 2018-08-21 ENCOUNTER — Telehealth (INDEPENDENT_AMBULATORY_CARE_PROVIDER_SITE_OTHER): Payer: Medicare Other | Admitting: Cardiovascular Disease

## 2018-08-21 ENCOUNTER — Other Ambulatory Visit: Payer: Self-pay

## 2018-08-21 ENCOUNTER — Encounter: Payer: Self-pay | Admitting: Cardiovascular Disease

## 2018-08-21 VITALS — BP 174/80 | Ht 62.0 in | Wt 187.0 lb

## 2018-08-21 DIAGNOSIS — Z7189 Other specified counseling: Secondary | ICD-10-CM

## 2018-08-21 DIAGNOSIS — I110 Hypertensive heart disease with heart failure: Secondary | ICD-10-CM

## 2018-08-21 DIAGNOSIS — I10 Essential (primary) hypertension: Secondary | ICD-10-CM

## 2018-08-21 DIAGNOSIS — I25709 Atherosclerosis of coronary artery bypass graft(s), unspecified, with unspecified angina pectoris: Secondary | ICD-10-CM | POA: Diagnosis not present

## 2018-08-21 DIAGNOSIS — I5032 Chronic diastolic (congestive) heart failure: Secondary | ICD-10-CM

## 2018-08-21 NOTE — Patient Instructions (Signed)
Medication Instructions:  Your physician recommends that you continue on your current medications as directed. Please refer to the Current Medication list given to you today.  If you need a refill on your cardiac medications before your next appointment, please call your pharmacy.    Lab work: None Ordered   Testing/Procedures: None Ordered   Follow-Up: At [facility], you and your health needs are our priority.  As part of our continuing mission to provide you with exceptional heart care, we have created designated Provider Care Teams.  These Care Teams include your primary Cardiologist (physician) and Advanced Practice Providers (APPs -  Physician Assistants and Nurse Practitioners) who all work together to provide you with the care you need, when you need it. You will need a follow up appointment in:  6 months.  Please call our office 2 months in advance to schedule this appointment.  You may see Dr. Acie Fredrickson or one of the following Advanced Practice Providers on your designated Care Team: Richardson Dopp, PA-C [place], [place] . Daune Perch, NP

## 2018-08-21 NOTE — Progress Notes (Signed)
Virtual Visit via Telephone Note   This visit type was conducted due to national recommendations for restrictions regarding the COVID-19 Pandemic (e.g. social distancing) in an effort to limit this patient's exposure and mitigate transmission in our community.  Due to her co-morbid illnesses, this patient is at least at moderate risk for complications without adequate follow up.  This format is felt to be most appropriate for this patient at this time.  The patient did not have access to video technology/had technical difficulties with video requiring transitioning to audio format only (telephone).  All issues noted in this document were discussed and addressed.  No physical exam could be performed with this format.  Please refer to the patient's chart for her  consent to telehealth for [facility].   Date:  08/21/2018   ID:  [name], DOB September 30, 1940, MRN [id number]  Patient Location: Home Provider Location: Office  PCP:  [name] Rail, MD  Cardiologist:   [name]  Electrophysiologist:  None   Evaluation Performed:  Follow-Up Visit   1. Coronary artery disease-status post CABG, the saphenous vein graft to the right coronary artery is severely diseased but she has good flow to the distal right coronary artery from the native circulation. 2. Diabetes mellitus 3. Hypertension 4. Hyperlipidemia 5. COPD: June, 2013 - FEV1 equals 0.89 L which is 48% of predicted value. Her DLCO is also markedly depressed. 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   [name] is a 78 y.o. female with the above noted hx. She continues to have episodes of sharp pain ( few seconds) followed by a burning pain ( last 6-7 minutes). She usually does not take NTG because it causes a headache. She was started on Lasix earlier this year. She seems to be doing much better on Lasix. She's had only function test which revealed severe obstructive lung disease with a  good initial response to bronchodilators.  She is scheduled to have surgery all right knee. She's here today for preoperative evaluation prior to having the surgery.  September 04, 2012:  [name] is seen today for a follow up visit. She saw [name] in April and was started on bystolic. She has tolerated that fairly well. She has some episodes of CP that are constant. She is very stable.   03/01/2013:  She continues to have significant CP. Still short of breath. She has no energy. Just does not feel well in general.  She does not get much sleep - perhaps 3 hours a night.   Jan. 8, 2016:  [name] is a 78 yo who we follow for CAD, DM, HTN, hyperlipidemia, . She has COPD: FEV1 equals 0.89 L which is 48% of predicted value. Her DLCO is also markedly depressed. 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  Breathing is the same. Has some upper respiratory issues.  Still has some tightness.    Jun 17, 2014:    [name] is a 78 y.o. female who presents for episodes of CP    Started 1 1/2 months ago.  Has tried mylanta - thought it was gas.  Did not get any better Occurs wjith exertion or at rest. , left sided chest pain   No radiation Gets lightheaded. Has tried NTG which seems to help.    Nov. 15 2016:  Has not been feeling well for the past couple of months Has a chest pressure when she gets out  of bed.  Occurs twice a day - in the AM when she gets up  And then again at 3:30 in the afternoon.   Is active between those times , makes home visit.   Does water aerobics without any CP / pressure.   Wears home O2 at night.     Jun 23, 2015: Doing the same.  Still has the same chest pain  Last cath was in 2014 - showed patent LIMA to LAD, SVG to D1, the SVG to RCA was occluded   Native RCA has a 40-50% prox stenosis   The pains occur typically twice a day - Upon awakening and around 7 PM.   Nov. 20, 2017:   Has had lots of fatigue / pressure in her chest . Increased shortness of breath walking a short distance  Has COPD - had issues with bronchitis ,  Was on prednisone  Pain was relieved with NTG and also by taking additional aspirin   April 08, 2016:  She has had some GI bleeding from diverticulitis  Was in the hospital for 4 days  Has been seeing [name].   Aug. 14, 2018: [name] is seen today for follow up    GI bleeding has resolved  Breathing is still an issue.  Feet are swollen by the end of the day  ( no swelling this am )   Jun 24, 2017:  Her BP has been variable  Her dyspnea has been worse recently . Having trouble sleeping at night.   Has been given home O2 .   Never arrived      August 21, 2018   Chief Complaint:  CAD   [name] is a 78 y.o. female with CAD.  Has COPD and reduced DLCO Has extreme fatigue Lots of diphoresis ,   Has some light headedness.  Unusual cp .   Like needles in her chest  Her BP is typically well controlled.  Glucose levels are very elevated.  300- 400    The patient does not have symptoms concerning for COVID-19 infection (fever, chills, cough, or new shortness of breath).    Past Medical History:  Diagnosis Date  . Adenomatous colon polyp   . Allergy   . Anxiety   . Asthma   . Chest pain   . Chronic diastolic CHF (congestive heart failure) ([place])   . COPD (chronic obstructive pulmonary disease) ([place])    pt is unsure if has been officially diagnosed  . Coronary artery disease    CABG '09- cathed 12/09, 9/10, 6/11, 3/14 and 12/13/16- medical Rx  . Diabetes mellitus   . GERD (gastroesophageal reflux disease)   . Hiatal hernia   . Hyperlipidemia   . Hypertension   . Myocardial infarction ([place]) 2009  . PONV (postoperative nausea and vomiting)   . Schatzki's ring   . Shoulder injury    resolved after shoulder surgery  . Sleep apnea    not on cpap   Past Surgical History:  Procedure Laterality Date  . ABDOMINAL  HYSTERECTOMY    . APPENDECTOMY     came out with Hysterectomy  . CARDIAC CATHETERIZATION  07/23/2009   EF 60%  . CARDIAC CATHETERIZATION  10/11/2008  . CARDIAC CATHETERIZATION  03/01/2007   EF 75-80%  . CARDIOVASCULAR STRESS TEST  11/15/2007   EF 60%  . COLONOSCOPY    . CORONARY ARTERY BYPASS GRAFT     SEVERELY DISEASED SAPHENOUS VEIN GRAFT TO THE RIGHT CORONARY ARTERY BUT  WITH FAIRLY WELL PRESERVED FLOW TO THE DISTAL RIGHT CORONARY ARTERY FROM THE NATIVE CIRCULATION-RESTART  CATH IN JUNE 2000, REVEALS MILD/MODERATE  CAD WITH GOOD FLOW DOWN HER LAD  . ESOPHAGOGASTRODUODENOSCOPY    . EYE SURGERY     bilateral cataract surgery with lens implant  . LEFT HEART CATH AND CORS/GRAFTS ANGIOGRAPHY N/A 12/14/2016   Procedure: LEFT HEART CATH AND CORS/GRAFTS ANGIOGRAPHY;  Surgeon: Jettie Booze, MD;  Location: [facility];  Service: Cardiovascular;  Laterality: N/A;  . lense removal Left   . POLYPECTOMY    . ROTATOR CUFF REPAIR     right and left  . TONSILLECTOMY     age 68  . 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 tumor removed kidney    . UPPER GASTROINTESTINAL ENDOSCOPY    . US ECHOCARDIOGRAPHY  03/08/2008   EF 55-60%     Current Meds  Medication Sig  . albuterol (PROVENTIL HFA;VENTOLIN HFA) 108 (90 BASE) MCG/ACT inhaler Inhale 2 puffs into the lungs every 6 (six) hours as needed. For shortness of breath.  Marland Kitchen albuterol (PROVENTIL) (2.5 MG/3ML) 0.083% nebulizer solution Take 3 mLs (2.5 mg total) by nebulization every 6 (six) hours as needed for wheezing or shortness of breath.  Marland Kitchen alum & mag hydroxide-simeth (MAALOX/MYLANTA) 200-200-20 MG/5ML suspension Take 30 mLs as needed by mouth for indigestion or heartburn.  Marland Kitchen aspirin EC 81 MG tablet Take 1 tablet (81 mg total) by mouth daily.  . baclofen (LIORESAL) 20 MG tablet Take 1 tablet (20 mg  total) by mouth 3 (three) times daily. Need office visit for more refills  . Blood Glucose Monitoring Suppl (ONE TOUCH ULTRA MINI) w/Device KIT 3 (three) times daily. for testing  . calcium carbonate (TUMS - DOSED IN MG ELEMENTAL CALCIUM) 500 MG chewable tablet Chew 2 tablets daily as needed by mouth for indigestion or heartburn.  . Cholecalciferol (VITAMIN D) 2000 UNITS tablet Take 2,000 Units by mouth daily.   . clonazePAM (KLONOPIN) 0.5 MG tablet Take 0.5 mg daily by mouth.  . CREON 24000-76000 units CPEP TAKE 2 CAPSULES BY MOUTH 3 TIMES A DAY WITH MEALS (TAK 1 CAPSULE WITH A SNACK)  . dapagliflozin propanediol (FARXIGA) 5 MG TABS tablet Take 5 mg by mouth daily.  . diclofenac sodium (VOLTAREN) 1 % GEL Apply 2 g topically 4 (four) times daily as needed (muscle pain).  Marland Kitchen diltiazem (CARDIZEM CD) 240 MG 24 hr capsule TAKE 1 CAPSULE BY MOUTH EVERY DAY  . ezetimibe (ZETIA) 10 MG tablet Take 10 mg by mouth daily.  . famotidine (PEPCID) 40 MG tablet Take 1 tablet (40 mg total) by mouth daily as needed.  . fexofenadine (ALLEGRA) 180 MG tablet Take 1 tablet (180 mg total) by mouth daily.  . fluticasone (FLONASE) 50 MCG/ACT nasal spray Place 2 sprays into both nostrils daily.  . Fluticasone-Salmeterol (ADVAIR DISKUS) 250-50 MCG/DOSE AEPB Inhale 1 puff into the lungs 2 (two) times daily.  . furosemide (LASIX) 40 MG tablet Take 1 tablet (40 mg total) by mouth daily.  . insulin lispro protamine-insulin lispro (HUMALOG 75/25) (75-25) 100 UNIT/ML SUSP Inject 15-22 Units into the skin See admin instructions. Takes 16 units in the morning, 10 units at lunch, and 25  units with supper  . isosorbide mononitrate (IMDUR) 60 MG 24 hr tablet Take 1.5 tablets (90 mg total) by mouth daily.  Marland Kitchen  lansoprazole (PREVACID) 30 MG capsule Take 1 capsule (30 mg total) by mouth 2 (two) times daily before a meal.  . loperamide (IMODIUM) 2 MG capsule Take 2 mg by mouth as needed for diarrhea or loose stools.   . meclizine  (ANTIVERT) 25 MG tablet Take 25 mg by mouth 2 (two) times daily as needed for dizziness.   . metoprolol succinate (TOPROL-XL) 100 MG 24 hr tablet TAKE 1 TABLET BY MOUTH EVERY DAY  . montelukast (SINGULAIR) 5 MG chewable tablet CHEW 1 TABLET (5 MG TOTAL) BY MOUTH AT BEDTIME.  . Multiple Vitamin (MULTIVITAMIN WITH MINERALS) TABS Take 1 tablet by mouth daily.  . Nebulizers (COMPRESSOR/NEBULIZER) MISC Use with albuterol  . nitroGLYCERIN (NITROSTAT) 0.4 MG SL tablet Place 0.4 mg under the tongue every 5 (five) minutes as needed for chest pain.  . nortriptyline (PAMELOR) 75 MG capsule TAKE 1 CAPSULE (75 MG TOTAL) BY MOUTH AT BEDTIME.  . ONE TOUCH ULTRA TEST test strip CHECK BLOOD SUGAR TWICE A DAY DX: E11.9  . ONETOUCH DELICA LANCETS 25O MISC 3 (three) times daily. for testing  . Spacer/Aero-Holding Chambers (AEROCHAMBER MV) inhaler Use as instructed  . Tiotropium Bromide Monohydrate (SPIRIVA RESPIMAT) 2.5 MCG/ACT AERS Inhale 2 puffs into the lungs daily.   Current Facility-Administered Medications for the 08/21/18 encounter (Telemedicine) with [name], Wonda Cheng, MD  Medication  . cyanocobalamin ((VITAMIN B-12)) injection 1,000 mcg     Allergies:   Amlodipine, Banana, Co q10 [coenzyme q10], Codeine, Metformin and related, Morphine, Pentazocine, Pravastatin, Repatha [evolocumab], Statins, Sulfa antibiotics, Sulfonamide derivatives, and Tramadol hcl   Social History   Tobacco Use  . Smoking status: Former Smoker    Packs/day: 0.50    Years: 21.00    Pack years: 10.50    Types: Cigarettes    Start date: 02/09/1955    Quit date: 02/09/1976    Years since quitting: 42.5  . Smokeless tobacco: Never Used  Substance Use Topics  . Alcohol use: No    Alcohol/week: 0.0 standard drinks  . Drug use: No     Family Hx: The patient's family history includes Diabetes in her maternal grandfather and mother; Emphysema in her paternal aunt and paternal uncle; Esophageal cancer (age of onset: 47) in her  brother; Heart attack in her father; Heart disease in her maternal grandfather; Heart failure in her maternal grandfather; Rheum arthritis in her sister. There is no history of Neuropathy, Multiple sclerosis, Colon cancer, Colon polyps, Rectal cancer, or Stomach cancer.  ROS:   Please see the history of present illness.     All other systems reviewed and are negative.   Prior CV studies:   The following studies were reviewed today:    Labs/Other Tests and Data Reviewed:    EKG:  No ECG reviewed.  Recent Labs: 12/20/2017: ALT 11; BUN 22; Creatinine, Ser 0.83; Hemoglobin 11.2; Platelets 285.0; Potassium 4.0; Sodium 143   Recent Lipid Panel Lab Results  Component Value Date/Time   CHOL 165 12/20/2017 07:40 AM   CHOL 119 12/22/2016 09:13 AM   TRIG 136.0 12/20/2017 07:40 AM   HDL 37.40 (L) 12/20/2017 07:40 AM   HDL 43 12/22/2016 09:13 AM   CHOLHDL 4 12/20/2017 07:40 AM   LDLCALC 101 (H) 12/20/2017 07:40 AM   LDLCALC 62 12/22/2016 09:13 AM    Wt Readings from Last 3 Encounters:  08/21/18 187 lb (84.8 kg)  08/17/18 187 lb (84.8 kg)  08/01/18 209 lb (94.8 kg)     Objective:  Vital Signs:  BP (!) 174/80   Ht _0  (1.575 m)   Wt 187 lb (84.8 kg)   LMP  (LMP Unknown)   BMI 34.20 kg/m      ASSESSMENT & [name]:    Coronary artery disease: Akaila seems to be doing well.  She is not having any episodes of angina.  She has occasional pins and needlelike sensation in her chest but this does not sound like angina.  It does not feel like her previous episodes of angina.  I reassured her that her heart seems to be okay.  2.  Generalized fatigue.  This seems to be her biggest complaint.  She informed me that her glucose levels ranged between 304 100.  I advised her that this is probably why she is feeling so poorly.  She is having some ongoing discussion with Dr. Chalmers Cater regarding treatment of her diabetes.  I have encouraged her to work on a diet exercise and weight loss program.   Advised her to adhere to Dr. bounds recommendations on medications.  She needs to be seen by Dr. Chalmers Cater soon.  I will see her in 6 months for a follow-up visit.  COVID-19 Education: The signs and symptoms of COVID-19 were discussed with the patient and how to seek care for testing (follow up with PCP or arrange E-visit).  The importance of social distancing was discussed today.  Time:   Today, I have spent  18  minutes with the patient with telehealth technology discussing the above problems.     Medication Adjustments/Labs and Tests Ordered: Current medicines are reviewed at length with the patient today.  Concerns regarding medicines are outlined above.   Tests Ordered: No orders of the defined types were placed in this encounter.   Medication Changes: No orders of the defined types were placed in this encounter.   Follow Up:  Virtual Visit or In Person in 6 month(s)  Signed, Mertie Moores, MD  08/21/2018 4:17 PM    [place]

## 2018-08-21 NOTE — Telephone Encounter (Signed)
LMTCB X2

## 2018-08-23 NOTE — Progress Notes (Signed)
Subjective:    Patient ID: [name], female    DOB: 26-Dec-1940, 78 y.o.   MRN: [id number]  HPI The patient is here for an acute visit.   For the past month she has not been doing good.  She just recently saw cardiology and her symptoms are not cardiac in nature.   She feels like she is moving in slow motion.  Everything is taking longer to do things.  She is very tired.  She sweats profusely at rest.  This sweating tends to occur just after lunch, dinner and before going to bed. That can occur daily and three times a day.  She has not checked her sugar when this occurs.  Her sugars have been crazy for the past 2 months.  She is now out of the 300's, but is still in the 200's.  She calls Dr Chalmers Cater every Thursday with her readings to have her medication adjusted.  She is eating only vegetables and protein.  She is doing an exercise routine at home.  She is frustrated that she is doing so much and it does not seem to be helping with her sugars and weight.  She denies any changes over the last month or so with medications or lifestyle that should cause her other symptoms.  Sleep apnea: She was diagnosed with sleep apnea in the past and she was using her CPAP.  Her CPAP was taken away from her because she kept pulling it off at night and therefore the insurance company felt she was noncompliant.  She did see neurology not long ago for her headaches that are thought to be secondary to the sleep apnea.  He recommended that she see pulmonary to help get her CPAP machine back.  Chest pain: She does have left-sided chest pain that cardiology said was not cardiac in nature.  It was intermittent, but she has had it in the left chest for the past 2 days.  She states it does hurt more with deep breaths.  It also hurts when she is in certain positions at night.   Medications and allergies reviewed with patient and updated if appropriate.  Patient Active Problem List   Diagnosis Date Noted  .  Arthralgia 08/01/2017  . COPD with asthma ([place]) 05/04/2017  . Angina pectoris ([place]) 01/05/2017  . Insomnia 11/23/2016  . Chronic nonintractable headache 11/23/2016  . Pancreatic insufficiency 10/28/2016  . Carotid artery disease ([place]) 05/19/2016  . Fatigue 05/19/2016  . Anemia 03/27/2016  . Acute bronchitis with COPD ([place]) 12/05/2015  . Type 1 diabetes mellitus ([place]) 07/21/2015  . B12 deficiency 03/31/2015  . OSA (obstructive sleep apnea) with hypoxia 11/20/2014  . Fecal incontinence 08/28/2014  . 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 Falls 08/28/2014  . Neck pain of over 3 months duration 08/28/2014  . H/O total knee replacement 02/20/2014  . Obesity (BMI 30-39.9) 11/12/2013  . Chronic diastolic CHF (congestive heart failure) ([place]) 11/17/2012  . Meniscus, lateral, anterior horn derangement 11/10/2011  . Medial meniscus, posterior horn derangement 11/10/2011  . Osteoarthritis of right knee 11/10/2011  . Vertigo 10/05/2011  . HTN (hypertension), benign 10/05/2011  . CAD (coronary artery disease) 08/27/2010  . MUSCLE CRAMPS 12/29/2009  . History of myocardial infarction 11/19/2009  . Extrinsic asthma 11/19/2009  . GERD 11/19/2009  . Cough 11/19/2009  . Hyperlipidemia 11/18/2009    Current Outpatient Medications on File Prior to Visit  Medication Sig Dispense Refill  . albuterol (PROVENTIL HFA;VENTOLIN HFA) 108 (90  BASE) MCG/ACT inhaler Inhale 2 puffs into the lungs every 6 (six) hours as needed. For shortness of breath. 1 Inhaler 0  . albuterol (PROVENTIL) (2.5 MG/3ML) 0.083% nebulizer solution Take 3 mLs (2.5 mg total) by nebulization every 6 (six) hours as needed for wheezing or shortness of breath. 75 mL 12  . alum & mag hydroxide-simeth (MAALOX/MYLANTA) 200-200-20 MG/5ML suspension Take 30 mLs as needed by mouth for indigestion or heartburn.    Marland Kitchen aspirin EC 81 MG tablet Take 1 tablet (81 mg total) by mouth daily.    . baclofen (LIORESAL) 20 MG tablet Take 1 tablet (20 mg  total) by mouth 3 (three) times daily. Need office visit for more refills 90 tablet 0  . Blood Glucose Monitoring Suppl (ONE TOUCH ULTRA MINI) w/Device KIT 3 (three) times daily. for testing  0  . calcium carbonate (TUMS - DOSED IN MG ELEMENTAL CALCIUM) 500 MG chewable tablet Chew 2 tablets daily as needed by mouth for indigestion or heartburn.    . Cholecalciferol (VITAMIN D) 2000 UNITS tablet Take 2,000 Units by mouth daily.     . clonazePAM (KLONOPIN) 0.5 MG tablet Take 0.5 mg daily by mouth.    . CREON 24000-76000 units CPEP TAKE 2 CAPSULES BY MOUTH 3 TIMES A DAY WITH MEALS (TAK 1 CAPSULE WITH A SNACK) 210 capsule 1  . dapagliflozin propanediol (FARXIGA) 5 MG TABS tablet Take 5 mg by mouth daily. 30 tablet 5  . diclofenac sodium (VOLTAREN) 1 % GEL Apply 2 g topically 4 (four) times daily as needed (muscle pain).    Marland Kitchen diltiazem (CARDIZEM CD) 240 MG 24 hr capsule TAKE 1 CAPSULE BY MOUTH EVERY DAY 90 capsule 2  . ezetimibe (ZETIA) 10 MG tablet Take 10 mg by mouth daily.  6  . famotidine (PEPCID) 40 MG tablet Take 1 tablet (40 mg total) by mouth daily as needed. 90 tablet 1  . fexofenadine (ALLEGRA) 180 MG tablet Take 1 tablet (180 mg total) by mouth daily. 30 tablet 6  . fluticasone (FLONASE) 50 MCG/ACT nasal spray Place 2 sprays into both nostrils daily. 16 g 6  . Fluticasone-Salmeterol (ADVAIR DISKUS) 250-50 MCG/DOSE AEPB Inhale 1 puff into the lungs 2 (two) times daily. 60 each 5  . furosemide (LASIX) 40 MG tablet Take 1 tablet (40 mg total) by mouth daily. 90 tablet 3  . insulin lispro protamine-insulin lispro (HUMALOG 75/25) (75-25) 100 UNIT/ML SUSP Inject 15-22 Units into the skin See admin instructions. Takes 16 units in the morning, 10 units at lunch, and 25  units with supper    . isosorbide mononitrate (IMDUR) 60 MG 24 hr tablet Take 1.5 tablets (90 mg total) by mouth daily. 135 tablet 1  . lansoprazole (PREVACID) 30 MG capsule Take 1 capsule (30 mg total) by mouth 2 (two) times daily  before a meal. 60 capsule 5  . loperamide (IMODIUM) 2 MG capsule Take 2 mg by mouth as needed for diarrhea or loose stools.     . meclizine (ANTIVERT) 25 MG tablet Take 25 mg by mouth 2 (two) times daily as needed for dizziness.     . montelukast (SINGULAIR) 5 MG chewable tablet CHEW 1 TABLET (5 MG TOTAL) BY MOUTH AT BEDTIME. 30 tablet 3  . Multiple Vitamin (MULTIVITAMIN WITH MINERALS) TABS Take 1 tablet by mouth daily.    . Nebulizers (COMPRESSOR/NEBULIZER) MISC Use with albuterol 1 each 0  . nitroGLYCERIN (NITROSTAT) 0.4 MG SL tablet Place 0.4 mg under the tongue every 5 (  five) minutes as needed for chest pain.    . nortriptyline (PAMELOR) 75 MG capsule TAKE 1 CAPSULE (75 MG TOTAL) BY MOUTH AT BEDTIME. 90 capsule 2  . ONE TOUCH ULTRA TEST test strip CHECK BLOOD SUGAR TWICE A DAY DX: E11.9  3  . ONETOUCH DELICA LANCETS 29J MISC 3 (three) times daily. for testing  0  . Spacer/Aero-Holding Chambers (AEROCHAMBER MV) inhaler Use as instructed 1 each 0  . Tiotropium Bromide Monohydrate (SPIRIVA RESPIMAT) 2.5 MCG/ACT AERS Inhale 2 puffs into the lungs daily. 4 g 6   Current Facility-Administered Medications on File Prior to Visit  Medication Dose Route Frequency Provider Last Rate Last Dose  . cyanocobalamin ((VITAMIN B-12)) injection 1,000 mcg  1,000 mcg Intramuscular Q30 days Binnie Rail, MD   1,000 mcg at 03/26/16 1658    Past Medical History:  Diagnosis Date  . Adenomatous colon polyp   . Allergy   . Anxiety   . Asthma   . Chest pain   . Chronic diastolic CHF (congestive heart failure) ([place])   . COPD (chronic obstructive pulmonary disease) ([place])    pt is unsure if has been officially diagnosed  . Coronary artery disease    CABG '09- cathed 12/09, 9/10, 6/11, 3/14 and 12/13/16- medical Rx  . Diabetes mellitus   . GERD (gastroesophageal reflux disease)   . Hiatal hernia   . Hyperlipidemia   . Hypertension   . Myocardial infarction ([place]) 2009  . PONV (postoperative nausea and  vomiting)   . Schatzki's ring   . Shoulder injury    resolved after shoulder surgery  . Sleep apnea    not on cpap    Past Surgical History:  Procedure Laterality Date  . ABDOMINAL HYSTERECTOMY    . APPENDECTOMY     came out with Hysterectomy  . CARDIAC CATHETERIZATION  07/23/2009   EF 60%  . CARDIAC CATHETERIZATION  10/11/2008  . CARDIAC CATHETERIZATION  03/01/2007   EF 75-80%  . CARDIOVASCULAR STRESS TEST  11/15/2007   EF 60%  . COLONOSCOPY    . CORONARY ARTERY BYPASS GRAFT     SEVERELY DISEASED SAPHENOUS VEIN GRAFT TO THE RIGHT CORONARY ARTERY BUT WITH FAIRLY WELL PRESERVED FLOW TO THE DISTAL RIGHT CORONARY ARTERY FROM THE NATIVE CIRCULATION-RESTART  CATH IN JUNE 2000, REVEALS MILD/MODERATE  CAD WITH GOOD FLOW DOWN HER LAD  . ESOPHAGOGASTRODUODENOSCOPY    . EYE SURGERY     bilateral cataract surgery with lens implant  . LEFT HEART CATH AND CORS/GRAFTS ANGIOGRAPHY N/A 12/14/2016   Procedure: LEFT HEART CATH AND CORS/GRAFTS ANGIOGRAPHY;  Surgeon: Jettie Booze, MD;  Location: [facility];  Service: Cardiovascular;  Laterality: N/A;  . lense removal Left   . POLYPECTOMY    . ROTATOR CUFF REPAIR     right and left  . TONSILLECTOMY     age 17  . 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 tumor removed kidney    . UPPER GASTROINTESTINAL ENDOSCOPY    . US ECHOCARDIOGRAPHY  03/08/2008   EF 55-60%    Social History   Socioeconomic History  . Marital status: Widowed    Spouse name: Not on file  . Number of children: 4  . Years of education: Designer, jewellery  . Highest education level: Not on file  Occupational History  . Occupation: Retired  Scientific laboratory technician  .  Financial resource strain: Very hard  . Food insecurity    Worry: Sometimes true    Inability: Sometimes true  . Transportation needs    Medical: No    Non-medical: No   Tobacco Use  . Smoking status: Former Smoker    Packs/day: 0.50    Years: 21.00    Pack years: 10.50    Types: Cigarettes    Start date: 02/09/1955    Quit date: 02/09/1976    Years since quitting: 42.5  . Smokeless tobacco: Never Used  Substance and Sexual Activity  . Alcohol use: No    Alcohol/week: 0.0 standard drinks  . Drug use: No  . Sexual activity: Never  Lifestyle  . Physical activity    Days per week: 3 days    Minutes per session: 40 min  . Stress: Only a little  Relationships  . Social connections    Talks on phone: More than three times a week    Gets together: More than three times a week    Attends religious service: More than 4 times per year    Active member of club or organization: Yes    Attends meetings of clubs or organizations: More than 4 times per year    Relationship status: Widowed  Other Topics Concern  . Not on file  Social History Narrative   Lives alone.  One story home.  Has 4 children.  Education: doctorate in theology.    Caffeine use: Drinks 1 cup coffee/day      Originally from [place]. Previously has lived in [place]. Prior travel to [place], [place], [place], [place], [place], [place], [place], & [place]. Previously worked in [employer] She has a dog currently. No bird, mold, or hot tub exposure. She also pastors a church.     Family History  Problem Relation Age of Onset  . Heart disease Maternal Grandfather   . Heart failure Maternal Grandfather   . Diabetes Maternal Grandfather   . Heart attack Father   . Diabetes Mother   . Rheum arthritis Sister   . Emphysema Paternal Uncle   . Esophageal cancer Brother 49       she said he was born with it  . Emphysema Paternal Aunt   . Neuropathy Neg Hx   . Multiple sclerosis Neg Hx   . Colon cancer Neg Hx   . Colon polyps Neg Hx   . Rectal cancer Neg Hx   . Stomach cancer Neg Hx     Review of Systems  Constitutional: Positive for diaphoresis and fatigue. Negative for chills and fever.   Respiratory: Positive for cough, shortness of breath (from humidity - chronic, no change) and wheezing.   Cardiovascular: Positive for chest pain (not cardiac per cardio), palpitations and leg swelling.  Gastrointestinal: Negative for abdominal pain, blood in stool, constipation, diarrhea and nausea.  Neurological: Positive for light-headedness and headaches. Negative for dizziness.  Psychiatric/Behavioral: Positive for sleep disturbance. Negative for dysphoric mood. The patient is not nervous/anxious.        Objective:   Vitals:   08/24/18 1101  BP: 112/60  Pulse: 86  Resp: 19  Temp: 98.2 F (36.8 C)   BP Readings from Last 3 Encounters:  08/24/18 112/60  08/21/18 (!) 174/80  08/01/18 128/68   Wt Readings from Last 3 Encounters:  08/24/18 208 lb (94.3 kg)  08/21/18 187 lb (84.8 kg)  08/17/18 187 lb (84.8 kg)   Body mass index is 38.04 kg/m.   Physical  Exam Constitutional:      General: She is not in acute distress.    Appearance: Normal appearance. She is not ill-appearing or diaphoretic.  HENT:     Head: Normocephalic and atraumatic.  Cardiovascular:     Rate and Rhythm: Normal rate and regular rhythm.  Pulmonary:     Effort: Pulmonary effort is normal. No respiratory distress.     Breath sounds: Normal breath sounds. No wheezing or rales.  Musculoskeletal:        General: Tenderness (Left chest wall with palpation) present.     Right lower leg: No edema.     Left lower leg: No edema.  Skin:    General: Skin is warm and dry.  Neurological:     Mental Status: She is alert.            Assessment & Plan:    See Problem List for Assessment and Plan of chronic medical problems.

## 2018-08-24 ENCOUNTER — Other Ambulatory Visit: Payer: Self-pay

## 2018-08-24 ENCOUNTER — Telehealth: Payer: Self-pay | Admitting: Pulmonary Disease

## 2018-08-24 ENCOUNTER — Ambulatory Visit (INDEPENDENT_AMBULATORY_CARE_PROVIDER_SITE_OTHER): Payer: Medicare Other | Admitting: Internal Medicine

## 2018-08-24 ENCOUNTER — Encounter: Payer: Self-pay | Admitting: Internal Medicine

## 2018-08-24 ENCOUNTER — Other Ambulatory Visit: Payer: Self-pay | Admitting: Cardiovascular Disease

## 2018-08-24 VITALS — BP 112/60 | HR 86 | Temp 98.2°F | Resp 19 | Wt 208.0 lb

## 2018-08-24 DIAGNOSIS — R61 Generalized hyperhidrosis: Secondary | ICD-10-CM

## 2018-08-24 DIAGNOSIS — R079 Chest pain, unspecified: Secondary | ICD-10-CM | POA: Insufficient documentation

## 2018-08-24 DIAGNOSIS — G4733 Obstructive sleep apnea (adult) (pediatric): Secondary | ICD-10-CM

## 2018-08-24 DIAGNOSIS — E1059 Type 1 diabetes mellitus with other circulatory complications: Secondary | ICD-10-CM | POA: Diagnosis not present

## 2018-08-24 DIAGNOSIS — E538 Deficiency of other specified B group vitamins: Secondary | ICD-10-CM

## 2018-08-24 DIAGNOSIS — R5383 Other fatigue: Secondary | ICD-10-CM | POA: Diagnosis not present

## 2018-08-24 DIAGNOSIS — D649 Anemia, unspecified: Secondary | ICD-10-CM

## 2018-08-24 DIAGNOSIS — I10 Essential (primary) hypertension: Secondary | ICD-10-CM

## 2018-08-24 MED ORDER — CYANOCOBALAMIN 1000 MCG/ML IJ SOLN
1000.0000 ug | Freq: Once | INTRAMUSCULAR | Status: AC
  Administered 2018-08-24: 1000 ug via INTRAMUSCULAR

## 2018-08-24 NOTE — Assessment & Plan Note (Signed)
Due for B12 injection today-we will give She was off of her B12 for a few months and this could be contributing to some of her fatigue

## 2018-08-24 NOTE — Assessment & Plan Note (Addendum)
Chest pain at rest and tender on exam and left chest wall Has seen cardiology and is not cardiac in nature Chest pain is likely musculoskeletal She states she also feels in the arm at times Possibly radiation or related to her shoulder or neck She will consider seeing orthopedics

## 2018-08-24 NOTE — Assessment & Plan Note (Signed)
Her fatigue is most likely multifactorial Her untreated sleep apnea, elevated sugars, chronic medical problems are all likely contributing She is exercising regularly and will continue She is working on getting her sugars better controlled She will contact pulmonary to be reevaluated for CPAP We will check blood work to rule out other causes-CBC, iron panel, CMP, TSH Continue B12 injections

## 2018-08-24 NOTE — Telephone Encounter (Signed)
Looks from previous phone message patient needed an appt set up. She was set up with an in office visit to discuss sleep study and CPAP. Scheduled 08/29/18 with Wyn Quaker FNP.   Nothing further needed at this time.

## 2018-08-24 NOTE — Assessment & Plan Note (Signed)
History of iron deficiency anemia Check CBC, iron panel This could be also contributing to some of her fatigue

## 2018-08-24 NOTE — Assessment & Plan Note (Signed)
Management per Dr. Chalmers Cater She will call her today and have her medication adjusted Encouraged her to continue her lifestyle efforts to help control her diabetes

## 2018-08-24 NOTE — Telephone Encounter (Signed)
LMTCB X3

## 2018-08-24 NOTE — Assessment & Plan Note (Signed)
BP well controlled Current regimen effective and well tolerated Continue current medications at current doses  

## 2018-08-24 NOTE — Patient Instructions (Addendum)
Contact pulmonary regarding your sleep apnea.      Tests ordered today. Your results will be released to [facility] (or called to you) after review.  If any changes need to be made, you will be notified at that same time.   You had a B12 injection today.   Medications reviewed and updated.  Changes include :   none

## 2018-08-24 NOTE — Assessment & Plan Note (Addendum)
Will contact pulmonary-they did try to contact her last week Currently not on CPAP, which is likely contributing to her fatigue Neurology also feels it is contributing to her headaches She will call pulmonary today to schedule an appointment

## 2018-08-27 NOTE — Progress Notes (Deleted)
_0  ID: [name], female    DOB: 1940/11/29, 78 y.o.   MRN: [id number]  No chief complaint on file.   Referring provider: Binnie Rail, MD  HPI:  78 year old female former smoker followed in our office for COPD asthma overlap syndrome  PMH: Hypertension, type 1 diabetes, pancreatic insufficiency, GERD, hyperlipidemia Smoker/ Smoking History: Former smoker.  10.5-pack-year smoking history.  Quit 1978. Maintenance: Advair 250, Spiriva Resp 2.5  Pt of: Dr. Lake Bells  08/27/2018  - Visit   HPI  Tests:   PFT 12/23/16: FVC 1.44 L (72%) FEV1 0.96 L (63%) FEV1/FVC 0.67 FEF 25-75 0.55 L (42%) negative bronchodilator response TLC 3.85 L (78%) RV 102% DLCO uncorrected 45% 06/04/15: FVC 1.57 L (77%) FEV1 1.01 L (64%) FEV1/FVC 0.65 FEF 25-75 0.54 L (39%) negative bronchodilator response 02/21/15: FVC 1.60 L (78%) FEV1 1.03 L (65%) FEV1/FVC 0.64 FEF 25-75 0.51 L (37%) positive bronchodilator response 11/20/14: FVC 1.44 L (70%) FEV1 0.91 L (57%) FEV1/FVC 0.63 FEF 25-75 0.47 L (33%) positive bronchodilator response TLC 3.92 L (79%) RV 103% DLCO uncorrected 64% 11/12/13: FVC 1.57 L (74%) FEV1 1.09 L (67%) FEV1/FVC 0.70 FEF 25-75 0.72 L (50%) negative bronchodilator response 08/04/11: FVC 1.61 L (60%) FEV1 0.89 L (48%) FEV1/FVC 0.56 FEF 25-75 0.29 L (14%) positive bronchodilator response TLC 3.67 L (80%) RV 103% ERV 46% DLCO corrected 38%  6MWT 11/20/14: Walked 336 meters / Baseline Sat 100% on RA / Nadir Sat 99% on RA (pt c/o pain in her right neck w/ dyspnea)  IMAGING November 2018 CT angiogram chest images showing centrilobular predominant emphysema, mild in an upper lobe distribution, no PE, there is a 5 mm right upper lobe nodule. 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 LIMA to LAD is patent.  Prox RCA lesion is 25% stenosed. SVG to PDA is occluded.  Mid RCA lesion is 25% stenosed.  Circumflex is a large tortuous  vessel.  Ost 1st Diag lesion is 100% stenosed. SVG to diagonal is patent.  The left ventricular systolic function is normal.  LV end diastolic pressure is normal.  The left ventricular ejection fraction is 55-65% by visual estimate.  There is no aortic valve stenosis.  TTE (10/06/11): LV normal in size. Normal regional wall motion. EF 60-65%. Grade 1 diastolic dysfunction. LA & RA normal in size. 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 RV normal in size and function. RVSP 19 mmHg. No aortic stenosis or regurgitation. No mitral stenosis or regurgitation. No pulmonic stenosis. Trivial tricuspid regurgitation. No pericardial effusion.  LABS 03/13/15 Procalcitonin: <0.1 CBC: 10.9/11.8/37.3/245  10/08/14 IgG: 1060 IgM: 112 IgA: 272 IgE: 5 CBC: Differential: Eos 0.3 (4.5%)  RAST panel: Negative Aspergillus antigen: <0.1  1/26/9 ABG: 7.39/44/113  Sleep study: 03/2017 Home sleep study showed no sleep apnea but her oxygen level dropped to 2L [place]  FENO:  Lab Results  Component Value Date   NITRICOXIDE 7 05/05/2017    PFT: PFT Results Latest Ref Rng & Units 12/23/2016 06/04/2015 02/21/2015 11/20/2014 11/12/2013  FVC-Pre L 1.44 1.57 1.60 1.44 1.57  FVC-Predicted Pre % 72 77 78 70 74  FVC-Post L 1.50 1.63 1.82 1.73 1.66  FVC-Predicted Post % 75 80 89 84 79  Pre FEV1/FVC % % 67 65 64 63 70  Post FEV1/FCV % % 68 69 67 70 71  FEV1-Pre L 0.96 1.01 1.03 0.91 1.09  FEV1-Predicted Pre % 63 64 65 57 67  FEV1-Post  L 1.03 1.12 1.23 1.21 1.17  DLCO UNC% % 45 - - 64 -  DLCO COR %Predicted % 73 - - 98 -  TLC L 3.85 - - 3.92 -  TLC % Predicted % 78 - - 79 -  RV % Predicted % 102 - - 103 -    Imaging: Dg Chest 2 View  Result Date: 08/01/2018 CLINICAL DATA:  Cough.  COPD. EXAM: CHEST - 2 VIEW COMPARISON:  CT 12/20/2017.  Chest x-ray 01/04/2017. FINDINGS: Prior CABG. Heart size normal. No focal infiltrate. No pleural effusion or pneumothorax. Total right shoulder replacement.  IMPRESSION: 1.  Prior CABG.  Heart size normal. 2.  No focal infiltrate.  No acute pulmonary disease. Electronically Signed   By: Marcello Moores  Register   On: 08/01/2018 11:55      Specialty Problems      Pulmonary Problems   Cough    Qualifier: Diagnosis of  By: Joya Gaskins MD, Burnett Harry       Extrinsic asthma    Severe persistent ASthma 08/04/2011  PFTs:  FeV1 48%  FVC 60%  25% improvement with BD TLC 80%  DLCO 38% ONO 08/08/2012 >>+nocturnal desats >40% <88% O2 sat >begin O2 2l/m At bedtime         OSA (obstructive sleep apnea) with hypoxia   Acute bronchitis with COPD ([place])   COPD with asthma ([place])    12/23/16: FVC 1.44 L (72%) FEV1 0.96 L (63%) FEV1/FVC 0.67 FEF 25-75 0.55 L (42%) negative bronchodilator response TLC 3.85 L (78%) RV 102% DLCO uncorrected 45%  November 2018 CT angiogram chest images showing centrilobular predominant emphysema, mild in an upper lobe distribution, no PE, there is a 5 mm right upper lobe nodule. 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 Amlodipine Other (See Comments)    hallucinations   . Banana Nausea And Vomiting    Stomach pumped  . Co Q10 [Coenzyme Q10]     Body cramps  . Codeine     Hallucinate, loose identity and don't know who I am  . Metformin And Related Nausea And Vomiting  . Morphine Other (See Comments)    Can not function, it immobilizes me   . Pentazocine Nausea And Vomiting  . Pravastatin     Hands locked up  . Repatha [Evolocumab]     myalgias  . Statins     Muscle cramps  . Sulfa Antibiotics Swelling  . Sulfonamide Derivatives Swelling  . Tramadol Hcl     Immunization History  Administered Date(s) Administered  . Influenza Split 12/15/2011, 11/08/2012, 11/19/2014  . Influenza Whole 10/09/2008  . Influenza, High Dose Seasonal PF 11/19/2015, 11/23/2016, 10/26/2017  . Influenza-Unspecified 10/29/2013  .  Pneumococcal Conjugate-13 09/03/2015  . Pneumococcal Polysaccharide-23 02/09/2004  . Pneumococcal-Unspecified 02/09/2012    Past Medical History:  Diagnosis Date  . Adenomatous colon polyp   . Allergy   . Anxiety   . Asthma   . Chest pain   . Chronic diastolic CHF (congestive heart failure) ([place])   . COPD (chronic obstructive pulmonary disease) ([place])    pt is unsure if has been officially diagnosed  . Coronary artery disease    CABG '09- cathed 12/09, 9/10, 6/11, 3/14 and 12/13/16- medical Rx  . Diabetes mellitus   . GERD (gastroesophageal reflux disease)   . Hiatal hernia   . Hyperlipidemia   .  Hypertension   . Myocardial infarction ([place]) 2009  . PONV (postoperative nausea and vomiting)   . Schatzki's ring   . Shoulder injury    resolved after shoulder surgery  . Sleep apnea    not on cpap    Tobacco History: Social History   Tobacco Use  Smoking Status Former Smoker  . Packs/day: 0.50  . Years: 21.00  . Pack years: 10.50  . Types: Cigarettes  . Start date: 02/09/1955  . Quit date: 02/09/1976  . Years since quitting: 42.5  Smokeless Tobacco Never Used   Counseling given: Not Answered   Continue to not smoke  Outpatient Encounter Medications as of 08/29/2018  Medication Sig  . albuterol (PROVENTIL HFA;VENTOLIN HFA) 108 (90 BASE) MCG/ACT inhaler Inhale 2 puffs into the lungs every 6 (six) hours as needed. For shortness of breath.  Marland Kitchen albuterol (PROVENTIL) (2.5 MG/3ML) 0.083% nebulizer solution Take 3 mLs (2.5 mg total) by nebulization every 6 (six) hours as needed for wheezing or shortness of breath.  Marland Kitchen alum & mag hydroxide-simeth (MAALOX/MYLANTA) 200-200-20 MG/5ML suspension Take 30 mLs as needed by mouth for indigestion or heartburn.  Marland Kitchen aspirin EC 81 MG tablet Take 1 tablet (81 mg total) by mouth daily.  . baclofen (LIORESAL) 20 MG tablet Take 1 tablet (20 mg total) by mouth 3 (three) times daily. Need office visit for more refills  . Blood Glucose Monitoring Suppl  (ONE TOUCH ULTRA MINI) w/Device KIT 3 (three) times daily. for testing  . calcium carbonate (TUMS - DOSED IN MG ELEMENTAL CALCIUM) 500 MG chewable tablet Chew 2 tablets daily as needed by mouth for indigestion or heartburn.  . Cholecalciferol (VITAMIN D) 2000 UNITS tablet Take 2,000 Units by mouth daily.   . clonazePAM (KLONOPIN) 0.5 MG tablet Take 0.5 mg daily by mouth.  . CREON 24000-76000 units CPEP TAKE 2 CAPSULES BY MOUTH 3 TIMES A DAY WITH MEALS (TAK 1 CAPSULE WITH A SNACK)  . diclofenac sodium (VOLTAREN) 1 % GEL Apply 2 g topically 4 (four) times daily as needed (muscle pain).  Marland Kitchen diltiazem (CARDIZEM CD) 240 MG 24 hr capsule TAKE 1 CAPSULE BY MOUTH EVERY DAY  . ezetimibe (ZETIA) 10 MG tablet Take 10 mg by mouth daily.  . famotidine (PEPCID) 40 MG tablet Take 1 tablet (40 mg total) by mouth daily as needed.  . fexofenadine (ALLEGRA) 180 MG tablet Take 1 tablet (180 mg total) by mouth daily.  . fluticasone (FLONASE) 50 MCG/ACT nasal spray Place 2 sprays into both nostrils daily.  . Fluticasone-Salmeterol (ADVAIR DISKUS) 250-50 MCG/DOSE AEPB Inhale 1 puff into the lungs 2 (two) times daily.  . furosemide (LASIX) 40 MG tablet Take 1 tablet (40 mg total) by mouth daily.  . insulin lispro protamine-insulin lispro (HUMALOG 75/25) (75-25) 100 UNIT/ML SUSP Inject 15-22 Units into the skin See admin instructions. Takes 16 units in the morning, 10 units at lunch, and 25  units with supper  . isosorbide mononitrate (IMDUR) 60 MG 24 hr tablet Take 1.5 tablets (90 mg total) by mouth daily.  . lansoprazole (PREVACID) 30 MG capsule Take 1 capsule (30 mg total) by mouth 2 (two) times daily before a meal.  . loperamide (IMODIUM) 2 MG capsule Take 2 mg by mouth as needed for diarrhea or loose stools.   . meclizine (ANTIVERT) 25 MG tablet Take 25 mg by mouth 2 (two) times daily as needed for dizziness.   . metoprolol succinate (TOPROL-XL) 100 MG 24 hr tablet Take 1 tablet (100 mg  total) by mouth daily.  .  montelukast (SINGULAIR) 5 MG chewable tablet CHEW 1 TABLET (5 MG TOTAL) BY MOUTH AT BEDTIME.  . Multiple Vitamin (MULTIVITAMIN WITH MINERALS) TABS Take 1 tablet by mouth daily.  . Nebulizers (COMPRESSOR/NEBULIZER) MISC Use with albuterol  . nitroGLYCERIN (NITROSTAT) 0.4 MG SL tablet Place 0.4 mg under the tongue every 5 (five) minutes as needed for chest pain.  . nortriptyline (PAMELOR) 75 MG capsule TAKE 1 CAPSULE (75 MG TOTAL) BY MOUTH AT BEDTIME.  . ONE TOUCH ULTRA TEST test strip CHECK BLOOD SUGAR TWICE A DAY DX: E11.9  . ONETOUCH DELICA LANCETS 55D MISC 3 (three) times daily. for testing  . Spacer/Aero-Holding Chambers (AEROCHAMBER MV) inhaler Use as instructed  . Tiotropium Bromide Monohydrate (SPIRIVA RESPIMAT) 2.5 MCG/ACT AERS Inhale 2 puffs into the lungs daily.   Facility-Administered Encounter Medications as of 08/29/2018  Medication  . cyanocobalamin ((VITAMIN B-12)) injection 1,000 mcg     Review of Systems  Review of Systems   Physical Exam  LMP  (LMP Unknown)   Wt Readings from Last 5 Encounters:  08/24/18 208 lb (94.3 kg)  08/21/18 187 lb (84.8 kg)  08/17/18 187 lb (84.8 kg)  08/01/18 209 lb (94.8 kg)  08/01/18 208 lb 3.2 oz (94.4 kg)     Physical Exam   Lab Results:  CBC    Component Value Date/Time   WBC 6.0 12/20/2017 0740   RBC 4.54 12/20/2017 0740   HGB 11.2 (L) 12/20/2017 0740   HGB 10.6 (L) 12/10/2016 1346   HCT 35.1 (L) 12/20/2017 0740   HCT 34.1 12/10/2016 1346   PLT 285.0 12/20/2017 0740   PLT 270 12/10/2016 1346   MCV 77.3 (L) 12/20/2017 0740   MCV 78 (L) 12/10/2016 1346   MCH 23.9 (L) 01/04/2017 1505   MCHC 31.8 12/20/2017 0740   RDW 17.6 (H) 12/20/2017 0740   RDW 16.1 (H) 12/10/2016 1346   LYMPHSABS 2.1 12/20/2017 0740   LYMPHSABS 2.2 06/21/2016 1628   MONOABS 0.5 12/20/2017 0740   EOSABS 0.2 12/20/2017 0740   EOSABS 0.1 06/21/2016 1628   BASOSABS 0.0 12/20/2017 0740   BASOSABS 0.0 06/21/2016 1628    BMET    Component Value  Date/Time   NA 143 12/20/2017 0740   NA 144 05/19/2017 1444   K 4.0 12/20/2017 0740   CL 104 12/20/2017 0740   CO2 28 12/20/2017 0740   GLUCOSE 151 (H) 12/20/2017 0740   BUN 22 12/20/2017 0740   BUN 15 05/19/2017 1444   CREATININE 0.83 12/20/2017 0740   CALCIUM 9.0 12/20/2017 0740   GFRNONAA 70 05/19/2017 1444   GFRAA 80 05/19/2017 1444    BNP    Component Value Date/Time   BNP 315.0 (H) 01/04/2017 1505    ProBNP    Component Value Date/Time   PROBNP 128 06/21/2016 1628   PROBNP 158.0 (H) 04/17/2012 1123      Assessment & Plan:   No problem-specific Assessment & Plan notes found for this encounter.    No follow-ups on file.   Lauraine Rinne, NP 08/27/2018   This appointment was *** minutes long with over 50% of the time in direct face-to-face patient care, assessment, plan of care, and follow-up.

## 2018-08-28 ENCOUNTER — Other Ambulatory Visit: Payer: Self-pay | Admitting: Pulmonary Disease

## 2018-08-29 ENCOUNTER — Ambulatory Visit: Payer: Medicare Other | Admitting: Pulmonary Disease

## 2018-09-04 NOTE — Telephone Encounter (Signed)
3 attempts made to attempt to reach the patient.  We will further evaluated patient's next scheduled office visit.  Nothing further needed at this time.  Wyn Quaker, FNP

## 2018-09-21 LAB — RESPIRATORY CULTURE OR RESPIRATORY AND SPUTUM CULTURE
MICRO NUMBER:: 616999
RESULT:: NORMAL
SPECIMEN QUALITY:: ADEQUATE

## 2018-09-21 LAB — MYCOBACTERIA,CULT W/FLUOROCHROME SMEAR
MICRO NUMBER:: 616998
SMEAR:: NONE SEEN
SPECIMEN QUALITY:: ADEQUATE

## 2018-09-21 LAB — FUNGUS CULTURE W SMEAR
MICRO NUMBER:: 616997
SPECIMEN QUALITY:: ADEQUATE

## 2018-09-28 ENCOUNTER — Ambulatory Visit: Payer: Self-pay

## 2018-09-28 NOTE — Telephone Encounter (Signed)
Appointment with Dr. Quay Burow tomorrow.

## 2018-09-28 NOTE — Progress Notes (Signed)
Subjective:    Patient ID: [name], female    DOB: 03-14-1940, 78 y.o.   MRN: [id number]  HPI The patient is here for an acute visit.   Dizziness:  She had dizziness in 2017. The dizziness started one month ago.  She feels like she is on a boat and denies any true spinning sensation.  When she walks she feels like she is walking crooked.  If she turns her head it takes a little time for her brain or body to catch up.  She has had some mild blurry vision and nausea, but denies any vomiting.  She denies any numbness, tingling or weakness in her arms or legs.  She denies difficulty swallowing.  She has had vertigo a few years ago-she does not recall of her symptoms were the same.  At that time she took meclizine.  Medications and allergies reviewed with patient and updated if appropriate.  Patient Active Problem List   Diagnosis Date Noted  . Chest pain at rest 08/24/2018  . Arthralgia 08/01/2017  . COPD with asthma ([place]) 05/04/2017  . Angina pectoris ([place]) 01/05/2017  . Insomnia 11/23/2016  . Chronic nonintractable headache 11/23/2016  . Pancreatic insufficiency 10/28/2016  . Carotid artery disease ([place]) 05/19/2016  . Fatigue 05/19/2016  . Anemia 03/27/2016  . Acute bronchitis with COPD ([place]) 12/05/2015  . Type 1 diabetes mellitus ([place]) 07/21/2015  . B12 deficiency 03/31/2015  . OSA (obstructive sleep apnea) with hypoxia 11/20/2014  . Fecal incontinence 08/28/2014  . 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 Falls 08/28/2014  . Neck pain of over 3 months duration 08/28/2014  . H/O total knee replacement 02/20/2014  . Obesity (BMI 30-39.9) 11/12/2013  . Chronic diastolic CHF (congestive heart failure) ([place]) 11/17/2012  . Meniscus, lateral, anterior horn derangement 11/10/2011  . Medial meniscus, posterior horn derangement 11/10/2011  . Osteoarthritis of right knee 11/10/2011  . Vertigo 10/05/2011  . HTN (hypertension), benign 10/05/2011  . CAD (coronary artery  disease) 08/27/2010  . MUSCLE CRAMPS 12/29/2009  . History of myocardial infarction 11/19/2009  . Extrinsic asthma 11/19/2009  . GERD 11/19/2009  . Cough 11/19/2009  . Hyperlipidemia 11/18/2009    Current Outpatient Medications on File Prior to Visit  Medication Sig Dispense Refill  . albuterol (PROVENTIL HFA;VENTOLIN HFA) 108 (90 BASE) MCG/ACT inhaler Inhale 2 puffs into the lungs every 6 (six) hours as needed. For shortness of breath. 1 Inhaler 0  . albuterol (PROVENTIL) (2.5 MG/3ML) 0.083% nebulizer solution USE 1 VIAL IN NEBULIZER EVERY 6 HOURS - and as needed 360 mL 1  . alum & mag hydroxide-simeth (MAALOX/MYLANTA) 200-200-20 MG/5ML suspension Take 30 mLs as needed by mouth for indigestion or heartburn.    Marland Kitchen aspirin EC 81 MG tablet Take 1 tablet (81 mg total) by mouth daily.    . baclofen (LIORESAL) 20 MG tablet Take 1 tablet (20 mg total) by mouth 3 (three) times daily. Need office visit for more refills 90 tablet 0  . Blood Glucose Monitoring Suppl (ONE TOUCH ULTRA MINI) w/Device KIT 3 (three) times daily. for testing  0  . calcium carbonate (TUMS - DOSED IN MG ELEMENTAL CALCIUM) 500 MG chewable tablet Chew 2 tablets daily as needed by mouth for indigestion or heartburn.    . Cholecalciferol (VITAMIN D) 2000 UNITS tablet Take 2,000 Units by mouth daily.     . clonazePAM (KLONOPIN) 0.5 MG tablet Take 0.5 mg daily by mouth.    . CREON 24000-76000 units CPEP TAKE  2 CAPSULES BY MOUTH 3 TIMES A DAY WITH MEALS (TAK 1 CAPSULE WITH A SNACK) 210 capsule 1  . diclofenac sodium (VOLTAREN) 1 % GEL Apply 2 g topically 4 (four) times daily as needed (muscle pain).    Marland Kitchen diltiazem (CARDIZEM CD) 240 MG 24 hr capsule TAKE 1 CAPSULE BY MOUTH EVERY DAY 90 capsule 2  . ezetimibe (ZETIA) 10 MG tablet Take 10 mg by mouth daily.  6  . famotidine (PEPCID) 40 MG tablet Take 1 tablet (40 mg total) by mouth daily as needed. 90 tablet 1  . fexofenadine (ALLEGRA) 180 MG tablet Take 1 tablet (180 mg total) by mouth  daily. 30 tablet 6  . fluticasone (FLONASE) 50 MCG/ACT nasal spray Place 2 sprays into both nostrils daily. 16 g 6  . Fluticasone-Salmeterol (ADVAIR DISKUS) 250-50 MCG/DOSE AEPB Inhale 1 puff into the lungs 2 (two) times daily. 60 each 5  . furosemide (LASIX) 40 MG tablet Take 1 tablet (40 mg total) by mouth daily. 90 tablet 3  . insulin lispro protamine-insulin lispro (HUMALOG 75/25) (75-25) 100 UNIT/ML SUSP Inject 15-22 Units into the skin See admin instructions. Takes 16 units in the morning, 10 units at lunch, and 25  units with supper    . isosorbide mononitrate (IMDUR) 60 MG 24 hr tablet Take 1.5 tablets (90 mg total) by mouth daily. 135 tablet 1  . lansoprazole (PREVACID) 30 MG capsule Take 1 capsule (30 mg total) by mouth 2 (two) times daily before a meal. 60 capsule 5  . loperamide (IMODIUM) 2 MG capsule Take 2 mg by mouth as needed for diarrhea or loose stools.     . meclizine (ANTIVERT) 25 MG tablet Take 25 mg by mouth 2 (two) times daily as needed for dizziness.     . metoprolol succinate (TOPROL-XL) 100 MG 24 hr tablet Take 1 tablet (100 mg total) by mouth daily. 30 tablet 11  . montelukast (SINGULAIR) 5 MG chewable tablet CHEW 1 TABLET (5 MG TOTAL) BY MOUTH AT BEDTIME. 30 tablet 3  . Multiple Vitamin (MULTIVITAMIN WITH MINERALS) TABS Take 1 tablet by mouth daily.    . Nebulizers (COMPRESSOR/NEBULIZER) MISC Use with albuterol 1 each 0  . nitroGLYCERIN (NITROSTAT) 0.4 MG SL tablet Place 0.4 mg under the tongue every 5 (five) minutes as needed for chest pain.    . nortriptyline (PAMELOR) 75 MG capsule TAKE 1 CAPSULE (75 MG TOTAL) BY MOUTH AT BEDTIME. 90 capsule 2  . ONE TOUCH ULTRA TEST test strip CHECK BLOOD SUGAR TWICE A DAY DX: E11.9  3  . ONETOUCH DELICA LANCETS 29U MISC 3 (three) times daily. for testing  0  . Spacer/Aero-Holding Chambers (AEROCHAMBER MV) inhaler Use as instructed 1 each 0  . Tiotropium Bromide Monohydrate (SPIRIVA RESPIMAT) 2.5 MCG/ACT AERS Inhale 2 puffs into the  lungs daily. 4 g 6   Current Facility-Administered Medications on File Prior to Visit  Medication Dose Route Frequency Provider Last Rate Last Dose  . cyanocobalamin ((VITAMIN B-12)) injection 1,000 mcg  1,000 mcg Intramuscular Q30 days Binnie Rail, MD   1,000 mcg at 03/26/16 1658    Past Medical History:  Diagnosis Date  . Adenomatous colon polyp   . Allergy   . Anxiety   . Asthma   . Chest pain   . Chronic diastolic CHF (congestive heart failure) ([place])   . COPD (chronic obstructive pulmonary disease) ([place])    pt is unsure if has been officially diagnosed  . Coronary artery disease  CABG '09- cathed 12/09, 9/10, 6/11, 3/14 and 12/13/16- medical Rx  . Diabetes mellitus   . GERD (gastroesophageal reflux disease)   . Hiatal hernia   . Hyperlipidemia   . Hypertension   . Myocardial infarction ([place]) 2009  . PONV (postoperative nausea and vomiting)   . Schatzki's ring   . Shoulder injury    resolved after shoulder surgery  . Sleep apnea    not on cpap    Past Surgical History:  Procedure Laterality Date  . ABDOMINAL HYSTERECTOMY    . APPENDECTOMY     came out with Hysterectomy  . CARDIAC CATHETERIZATION  07/23/2009   EF 60%  . CARDIAC CATHETERIZATION  10/11/2008  . CARDIAC CATHETERIZATION  03/01/2007   EF 75-80%  . CARDIOVASCULAR STRESS TEST  11/15/2007   EF 60%  . COLONOSCOPY    . CORONARY ARTERY BYPASS GRAFT     SEVERELY DISEASED SAPHENOUS VEIN GRAFT TO THE RIGHT CORONARY ARTERY BUT WITH FAIRLY WELL PRESERVED FLOW TO THE DISTAL RIGHT CORONARY ARTERY FROM THE NATIVE CIRCULATION-RESTART  CATH IN JUNE 2000, REVEALS MILD/MODERATE  CAD WITH GOOD FLOW DOWN HER LAD  . ESOPHAGOGASTRODUODENOSCOPY    . EYE SURGERY     bilateral cataract surgery with lens implant  . LEFT HEART CATH AND CORS/GRAFTS ANGIOGRAPHY N/A 12/14/2016   Procedure: LEFT HEART CATH AND CORS/GRAFTS ANGIOGRAPHY;  Surgeon: Jettie Booze, MD;  Location: [facility];  Service: Cardiovascular;   Laterality: N/A;  . lense removal Left   . POLYPECTOMY    . ROTATOR CUFF REPAIR     right and left  . TONSILLECTOMY     age 74  . 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 tumor removed kidney    . UPPER GASTROINTESTINAL ENDOSCOPY    . US ECHOCARDIOGRAPHY  03/08/2008   EF 55-60%    Social History   Socioeconomic History  . Marital status: Widowed    Spouse name: Not on file  . Number of children: 4  . Years of education: Designer, jewellery  . Highest education level: Not on file  Occupational History  . Occupation: Retired  Scientific laboratory technician  . Financial resource strain: Very hard  . Food insecurity    Worry: Sometimes true    Inability: Sometimes true  . Transportation needs    Medical: No    Non-medical: No  Tobacco Use  . Smoking status: Former Smoker    Packs/day: 0.50    Years: 21.00    Pack years: 10.50    Types: Cigarettes    Start date: 02/09/1955    Quit date: 02/09/1976    Years since quitting: 42.6  . Smokeless tobacco: Never Used  Substance and Sexual Activity  . Alcohol use: No    Alcohol/week: 0.0 standard drinks  . Drug use: No  . Sexual activity: Never  Lifestyle  . Physical activity    Days per week: 3 days    Minutes per session: 40 min  . Stress: Only a little  Relationships  . Social connections    Talks on phone: More than three times a week    Gets together: More than three times a week    Attends religious service: More than 4 times per year    Active member of club or organization: Yes    Attends meetings of clubs or organizations: More than  4 times per year    Relationship status: Widowed  Other Topics Concern  . Not on file  Social History Narrative   Lives alone.  One story home.  Has 4 children.  Education: doctorate in theology.    Caffeine use: Drinks 1 cup coffee/day      Originally from [place]. Previously  has lived in [place]. Prior travel to [place], [place], [place], [place], [place], [place], [place], & [place]. Previously worked in [employer] She has a dog currently. No bird, mold, or hot tub exposure. She also pastors a church.     Family History  Problem Relation Age of Onset  . Heart disease Maternal Grandfather   . Heart failure Maternal Grandfather   . Diabetes Maternal Grandfather   . Heart attack Father   . Diabetes Mother   . Rheum arthritis Sister   . Emphysema Paternal Uncle   . Esophageal cancer Brother 11       she said he was born with it  . Emphysema Paternal Aunt   . Neuropathy Neg Hx   . Multiple sclerosis Neg Hx   . Colon cancer Neg Hx   . Colon polyps Neg Hx   . Rectal cancer Neg Hx   . Stomach cancer Neg Hx     Review of Systems  Constitutional: Positive for diaphoresis. Negative for chills and fever.  HENT: Negative for congestion, ear pain, sinus pain and sore throat.   Eyes: Positive for visual disturbance.  Respiratory: Positive for wheezing (chronic). Negative for cough and shortness of breath.   Cardiovascular: Positive for chest pain (not cardiac). Negative for palpitations.  Gastrointestinal: Positive for nausea. Negative for vomiting.  Neurological: Positive for dizziness and headaches (chronic). Negative for weakness and numbness.       Objective:   Vitals:   09/29/18 1508  BP: (!) 160/82  Resp: 16  Temp: 98.7 F (37.1 C)   BP Readings from Last 3 Encounters:  09/29/18 (!) 160/82  08/24/18 112/60  08/21/18 (!) 174/80   Wt Readings from Last 3 Encounters:  09/29/18 207 lb (93.9 kg)  08/24/18 208 lb (94.3 kg)  08/21/18 187 lb (84.8 kg)   Body mass index is 37.86 kg/m.   Physical Exam Constitutional:      General: She is not in acute distress.    Appearance: Normal appearance. She is not ill-appearing.  HENT:     Head: Normocephalic and atraumatic.     Right Ear: Tympanic membrane and ear canal normal.     Left Ear: Tympanic  membrane and ear canal normal.     Mouth/Throat:     Mouth: Mucous membranes are moist.     Pharynx: No posterior oropharyngeal erythema.  Eyes:     Conjunctiva/sclera: Conjunctivae normal.  Neck:     Musculoskeletal: Neck supple.  Cardiovascular:     Rate and Rhythm: Normal rate and regular rhythm.  Pulmonary:     Effort: Pulmonary effort is normal. No respiratory distress.     Breath sounds: No wheezing or rales.  Lymphadenopathy:     Cervical: No cervical adenopathy.  Skin:    General: Skin is warm and dry.  Neurological:     General: No focal deficit present.     Mental Status: She is alert.     Cranial Nerves: No cranial nerve deficit.     Sensory: No sensory deficit.     Motor: No weakness.     Gait: Gait abnormal (Slightly unsteady with walking).  Assessment & Plan:    See Problem List for Assessment and Plan of chronic medical problems.

## 2018-09-28 NOTE — Telephone Encounter (Signed)
Incoming call from Patient  With a complaint of feeling dizzy for a month.  Reports that her gate has been off also.  Rates it moderate to severe. Blood pressure is 132/95 heart rate 91.   Automatic  Machine.  Patient reports she has chest Reports nausea today.   pain every day.  Reviewed Protocol and provided care advice. Transferred call to office to make appointment.          Reason for Disposition . [1] MODERATE dizziness (e.g., interferes with normal activities) AND [2] has NOT been evaluated by physician for this  (Exception: dizziness caused by heat exposure, sudden standing, or poor fluid intake)  Answer Assessment - Initial Assessment Questions 1. DESCRIPTION: "Describe your dizziness."     Gait is off 2. LIGHTHEADED: "Do you feel lightheaded?" (e.g., somewhat faint, woozy, weak upon standing)      3. VERTIGO: "Do you feel like either you or the room is spinning or tilting?" (i.e. vertigo)     denies 4. SEVERITY: "How bad is it?"  "Do you feel like you are going to faint?" "Can you stand and walk?"   - MILD - walking normally   - MODERATE - interferes with normal activities (e.g., work, school)    - SEVERE - unable to stand, requires support to walk, feels like passing out now.      Moderate and severeSET:  "When did the dizziness begin?"     6. AGGRAVATING FACTORS: "Does anything make it worse?" (e.g., standing, change in head position)     Change in head position 7. HEART RATE: "Can you tell me your heart rate?" "How many beats in 15 seconds?"  (Note: not all patients can do this)      132/95   HR 918. CAUSE: "What do you think is causing the dizziness?"     9. RECURRENT SYMPTOM: "Have you had dizziness before?" If so, ask: "When was the last time?" "What happened that time?"     happenening for a month 10. OTHER SYMPTOMS: "Do you have any other symptoms?" (e.g., fever, chest pain, vomiting, diarrhea, bleeding)      Has chest pain every day nausea 11. PREGNANCY: "Is there  any chance you are pregnant?" "When was your last menstrual period?"       na  Protocols used: DIZZINESS [facility]

## 2018-09-29 ENCOUNTER — Encounter: Payer: Self-pay | Admitting: Internal Medicine

## 2018-09-29 ENCOUNTER — Ambulatory Visit (INDEPENDENT_AMBULATORY_CARE_PROVIDER_SITE_OTHER): Payer: Medicare Other | Admitting: Internal Medicine

## 2018-09-29 ENCOUNTER — Other Ambulatory Visit: Payer: Self-pay

## 2018-09-29 VITALS — BP 160/82 | Temp 98.7°F | Resp 16 | Ht 62.0 in | Wt 207.0 lb

## 2018-09-29 DIAGNOSIS — Z23 Encounter for immunization: Secondary | ICD-10-CM

## 2018-09-29 DIAGNOSIS — R42 Dizziness and giddiness: Secondary | ICD-10-CM

## 2018-09-29 MED ORDER — MECLIZINE HCL 25 MG PO TABS: 25.0000 mg | ORAL_TABLET | Freq: Two times a day (BID) | ORAL | 2 refills | Status: DC | PRN

## 2018-09-29 NOTE — Patient Instructions (Addendum)
Medications reviewed and updated.  Changes include :   Meclizine as needed  Your prescription(s) have been submitted to your pharmacy. Please take as directed and contact our office if you believe you are having problem(s) with the medication(s).  A referral was ordered for Physical Therapy     Benign Positional Vertigo Vertigo is the feeling that you or your surroundings are moving when they are not. Benign positional vertigo is the most common form of vertigo. This is usually a harmless condition (benign). This condition is positional. This means that symptoms are triggered by certain movements and positions. This condition can be dangerous if it occurs while you are doing something that could cause harm to you or others. This includes activities such as driving or operating machinery. What are the causes? In many cases, the cause of this condition is not known. It may be caused by a disturbance in an area of the inner ear that helps your brain to sense movement and balance. This disturbance can be caused by:  Viral infection (labyrinthitis).  Head injury.  Repetitive motion, such as jumping, dancing, or running. What increases the risk? You are more likely to develop this condition if:  You are a woman.  You are 58 years of age or older. What are the signs or symptoms? Symptoms of this condition usually happen when you move your head or your eyes in different directions. Symptoms may start suddenly, and usually last for less than a minute. They include:  Loss of balance and falling.  Feeling like you are spinning or moving.  Feeling like your surroundings are spinning or moving.  Nausea and vomiting.  Blurred vision.  Dizziness.  Involuntary eye movement (nystagmus). Symptoms can be mild and cause only minor problems, or they can be severe and interfere with daily life. Episodes of benign positional vertigo may return (recur) over time. Symptoms may improve over  time. How is this diagnosed? This condition may be diagnosed based on:  Your medical history.  Physical exam of the head, neck, and ears.  Tests, such as: ? MRI. ? CT scan. ? Eye movement tests. Your health care provider may ask you to change positions quickly while he or she watches you for symptoms of benign positional vertigo, such as nystagmus. Eye movement may be tested with a variety of exams that are designed to evaluate or stimulate vertigo. ? An electroencephalogram (EEG). This records electrical activity in your brain. ? Hearing tests. You may be referred to a health care provider who specializes in ear, nose, and throat (ENT) problems (otolaryngologist) or a provider who specializes in disorders of the nervous system (neurologist). How is this treated?  This condition may be treated in a session in which your health care provider moves your head in specific positions to adjust your inner ear back to normal. Treatment for this condition may take several sessions. Surgery may be needed in severe cases, but this is rare. In some cases, benign positional vertigo may resolve on its own in 2-4 weeks. Follow these instructions at home: Safety  Move slowly. Avoid sudden body or head movements or certain positions, as told by your health care provider.  Avoid driving until your health care provider says it is safe for you to do so.  Avoid operating heavy machinery until your health care provider says it is safe for you to do so.  Avoid doing any tasks that would be dangerous to you or others if vertigo occurs.  If you  have trouble walking or keeping your balance, try using a cane for stability. If you feel dizzy or unstable, sit down right away.  Return to your normal activities as told by your health care provider. Ask your health care provider what activities are safe for you. General instructions  Take over-the-counter and prescription medicines only as told by your health  care provider.  Drink enough fluid to keep your urine pale yellow.  Keep all follow-up visits as told by your health care provider. This is important. Contact a health care provider if:  You have a fever.  Your condition gets worse or you develop new symptoms.  Your family or friends notice any behavioral changes.  You have nausea or vomiting that gets worse.  You have numbness or a "pins and needles" sensation. Get help right away if you:  Have difficulty speaking or moving.  Are always dizzy.  Faint.  Develop severe headaches.  Have weakness in your legs or arms.  Have changes in your hearing or vision.  Develop a stiff neck.  Develop sensitivity to light. Summary  Vertigo is the feeling that you or your surroundings are moving when they are not. Benign positional vertigo is the most common form of vertigo.  The cause of this condition is not known. It may be caused by a disturbance in an area of the inner ear that helps your brain to sense movement and balance.  Symptoms include loss of balance and falling, feeling that you or your surroundings are moving, nausea and vomiting, and blurred vision.  This condition can be diagnosed based on symptoms, physical exam, and other tests, such as MRI, CT scan, eye movement tests, and hearing tests.  Follow safety instructions as told by your health care provider. You will also be told when to contact your health care provider in case of problems. This information is not intended to replace advice given to you by your health care provider. Make sure you discuss any questions you have with your health care provider. Document Released: 11/02/2005 Document Revised: 07/06/2017 Document Reviewed: 07/06/2017 Elsevier Patient Education  2020 [employer].

## 2018-09-30 ENCOUNTER — Encounter: Payer: Self-pay | Admitting: Internal Medicine

## 2018-09-30 NOTE — Assessment & Plan Note (Signed)
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

## 2018-10-02 ENCOUNTER — Ambulatory Visit: Payer: Medicare Other | Admitting: Pulmonary Disease

## 2018-10-02 ENCOUNTER — Telehealth: Payer: Self-pay | Admitting: Pulmonary Disease

## 2018-10-02 NOTE — Progress Notes (Deleted)
'@Patient'  ID: [name], female    DOB: 02-13-1940, 78 y.o.   MRN: [id number]  No chief complaint on file.   Referring provider: Binnie Rail, MD  HPI:  78 year old female former smoker followed in our office for COPD asthma overlap syndrome  PMH: Hypertension, type 1 diabetes, pancreatic insufficiency, GERD, hyperlipidemia Smoker/ Smoking History: Former smoker.  10.5-pack-year smoking history.  Quit 1978. Maintenance: Advair 250, Spiriva Resp 2.5  Pt of: Dr. Lake Bells  10/02/2018  - Visit   HPI  Tests:   PFT 12/23/16: FVC 1.44 L (72%) FEV1 0.96 L (63%) FEV1/FVC 0.67 FEF 25-75 0.55 L (42%) negative bronchodilator response TLC 3.85 L (78%) RV 102% DLCO uncorrected 45% 06/04/15: FVC 1.57 L (77%) FEV1 1.01 L (64%) FEV1/FVC 0.65 FEF 25-75 0.54 L (39%) negative bronchodilator response 02/21/15: FVC 1.60 L (78%) FEV1 1.03 L (65%) FEV1/FVC 0.64 FEF 25-75 0.51 L (37%) positive bronchodilator response 11/20/14: FVC 1.44 L (70%) FEV1 0.91 L (57%) FEV1/FVC 0.63 FEF 25-75 0.47 L (33%) positive bronchodilator response TLC 3.92 L (79%) RV 103% DLCO uncorrected 64% 11/12/13: FVC 1.57 L (74%) FEV1 1.09 L (67%) FEV1/FVC 0.70 FEF 25-75 0.72 L (50%) negative bronchodilator response 08/04/11: FVC 1.61 L (60%) FEV1 0.89 L (48%) FEV1/FVC 0.56 FEF 25-75 0.29 L (14%) positive bronchodilator response TLC 3.67 L (80%) RV 103% ERV 46% DLCO corrected 38%  6MWT 11/20/14: Walked 336 meters / Baseline Sat 100% on RA / Nadir Sat 99% on RA (pt c/o pain in her right neck w/ dyspnea)  IMAGING November 2018 CT angiogram chest images showing centrilobular predominant emphysema, mild in an upper lobe distribution, no PE, there is a 5 mm right upper lobe nodule. 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 LIMA to LAD is patent.  Prox RCA lesion is 25% stenosed. SVG to PDA is occluded.  Mid RCA lesion is 25% stenosed.  Circumflex is a large tortuous  vessel.  Ost 1st Diag lesion is 100% stenosed. SVG to diagonal is patent.  The left ventricular systolic function is normal.  LV end diastolic pressure is normal.  The left ventricular ejection fraction is 55-65% by visual estimate.  There is no aortic valve stenosis.  TTE (10/06/11): LV normal in size. Normal regional wall motion. EF 60-65%. Grade 1 diastolic dysfunction. LA & RA normal in size. 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 RV normal in size and function. RVSP 19 mmHg. No aortic stenosis or regurgitation. No mitral stenosis or regurgitation. No pulmonic stenosis. Trivial tricuspid regurgitation. No pericardial effusion.  LABS  10/08/14 IgG: 1060 IgM: 112 IgA: 272 IgE: 5 CBC: Differential: Eos 0.3 (4.5%)  RAST panel: Negative Aspergillus antigen: <0.1  08/07/2018-respiratory sputum- Pseudomonas [place], Escherichia coli >>>PA-sensitive to Levaquin, ciprofloxacin >>>E. Coli: >>>resistant to Augmentin, ampicillin, ampicillin sulbactam, Zosyn >>>Sensitive: Levaquin, ciprofloxacin, Bactrim  08/07/2018-fungal culture-- many budding yeast with pseudohyphae seen, Candida tropicalis, Candida albicans  Sleep study: 03/2017 Home sleep study showed no sleep apnea but her oxygen level dropped to 2L [place]   FENO:  Lab Results  Component Value Date   NITRICOXIDE 7 05/05/2017    PFT: PFT Results Latest Ref Rng & Units 12/23/2016 06/04/2015 02/21/2015 11/20/2014 11/12/2013  FVC-Pre L 1.44 1.57 1.60 1.44 1.57  FVC-Predicted Pre % 72 77 78 70 74  FVC-Post L 1.50 1.63 1.82 1.73 1.66  FVC-Predicted Post % 75 80 89 84 79  Pre FEV1/FVC % % 67 65 64 63 70  Post FEV1/FCV % % 68 69 67 70 71  FEV1-Pre L 0.96 1.01 1.03 0.91 1.09  FEV1-Predicted Pre % 63 64 65 57 67  FEV1-Post L 1.03 1.12 1.23 1.21 1.17  DLCO UNC% % 45 - - 64 -  DLCO COR %Predicted % 73 - - 98 -  TLC L 3.85 - - 3.92 -  TLC % Predicted % 78 - - 79 -  RV % Predicted % 102 - - 103 -    Imaging: No results  found.    Specialty Problems      Pulmonary Problems   Cough    Qualifier: Diagnosis of  By: Joya Gaskins MD, Burnett Harry       Extrinsic asthma    Severe persistent ASthma 08/04/2011  PFTs:  FeV1 48%  FVC 60%  25% improvement with BD TLC 80%  DLCO 38% ONO 08/08/2012 >>+nocturnal desats >40% <88% O2 sat >begin O2 2l/m At bedtime         OSA (obstructive sleep apnea) with hypoxia   Acute bronchitis with COPD ([place])   COPD with asthma ([place])    12/23/16: FVC 1.44 L (72%) FEV1 0.96 L (63%) FEV1/FVC 0.67 FEF 25-75 0.55 L (42%) negative bronchodilator response TLC 3.85 L (78%) RV 102% DLCO uncorrected 45%  November 2018 CT angiogram chest images showing centrilobular predominant emphysema, mild in an upper lobe distribution, no PE, there is a 5 mm right upper lobe nodule. 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 Amlodipine Other (See Comments)    hallucinations   . Banana Nausea And Vomiting    Stomach pumped  . Co Q10 [Coenzyme Q10]     Body cramps  . Codeine     Hallucinate, loose identity and don't know who I am  . Metformin And Related Nausea And Vomiting  . Morphine Other (See Comments)    Can not function, it immobilizes me   . Pentazocine Nausea And Vomiting  . Pravastatin     Hands locked up  . Repatha [Evolocumab]     myalgias  . Statins     Muscle cramps  . Sulfa Antibiotics Swelling  . Sulfonamide Derivatives Swelling  . Tramadol Hcl     Immunization History  Administered Date(s) Administered  . Fluad Quad(high Dose 65+) 09/29/2018  . Influenza Split 12/15/2011, 11/08/2012, 11/19/2014  . Influenza Whole 10/09/2008  . Influenza, High Dose Seasonal PF 11/19/2015, 11/23/2016, 10/26/2017  . Influenza-Unspecified 10/29/2013  . Pneumococcal Conjugate-13 09/03/2015  . Pneumococcal Polysaccharide-23 02/09/2004  . Pneumococcal-Unspecified 02/09/2012    Past  Medical History:  Diagnosis Date  . Adenomatous colon polyp   . Allergy   . Anxiety   . Asthma   . Chest pain   . Chronic diastolic CHF (congestive heart failure) ([place])   . COPD (chronic obstructive pulmonary disease) ([place])    pt is unsure if has been officially diagnosed  . Coronary artery disease    CABG '09- cathed 12/09, 9/10, 6/11, 3/14 and 12/13/16- medical Rx  . Diabetes mellitus   . GERD (gastroesophageal reflux disease)   . Hiatal hernia   . Hyperlipidemia   . Hypertension   . Myocardial infarction ([place]) 2009  . PONV (postoperative nausea and vomiting)   . Schatzki's ring   . Shoulder injury    resolved after shoulder surgery  . Sleep apnea    not  on cpap    Tobacco History: Social History   Tobacco Use  Smoking Status Former Smoker  . Packs/day: 0.50  . Years: 21.00  . Pack years: 10.50  . Types: Cigarettes  . Start date: 02/09/1955  . Quit date: 02/09/1976  . Years since quitting: 42.6  Smokeless Tobacco Never Used   Counseling given: Not Answered   Continue to not smoke  Outpatient Encounter Medications as of 10/02/2018  Medication Sig  . albuterol (PROVENTIL HFA;VENTOLIN HFA) 108 (90 BASE) MCG/ACT inhaler Inhale 2 puffs into the lungs every 6 (six) hours as needed. For shortness of breath.  Marland Kitchen albuterol (PROVENTIL) (2.5 MG/3ML) 0.083% nebulizer solution USE 1 VIAL IN NEBULIZER EVERY 6 HOURS - and as needed  . alum & mag hydroxide-simeth (MAALOX/MYLANTA) 200-200-20 MG/5ML suspension Take 30 mLs as needed by mouth for indigestion or heartburn.  Marland Kitchen aspirin EC 81 MG tablet Take 1 tablet (81 mg total) by mouth daily.  . baclofen (LIORESAL) 20 MG tablet Take 1 tablet (20 mg total) by mouth 3 (three) times daily. Need office visit for more refills  . Blood Glucose Monitoring Suppl (ONE TOUCH ULTRA MINI) w/Device KIT 3 (three) times daily. for testing  . calcium carbonate (TUMS - DOSED IN MG ELEMENTAL CALCIUM) 500 MG chewable tablet Chew 2 tablets daily as needed by  mouth for indigestion or heartburn.  . Cholecalciferol (VITAMIN D) 2000 UNITS tablet Take 2,000 Units by mouth daily.   . clonazePAM (KLONOPIN) 0.5 MG tablet Take 0.5 mg daily by mouth.  . CREON 24000-76000 units CPEP TAKE 2 CAPSULES BY MOUTH 3 TIMES A DAY WITH MEALS (TAK 1 CAPSULE WITH A SNACK)  . diclofenac sodium (VOLTAREN) 1 % GEL Apply 2 g topically 4 (four) times daily as needed (muscle pain).  Marland Kitchen diltiazem (CARDIZEM CD) 240 MG 24 hr capsule TAKE 1 CAPSULE BY MOUTH EVERY DAY  . ezetimibe (ZETIA) 10 MG tablet Take 10 mg by mouth daily.  . famotidine (PEPCID) 40 MG tablet Take 1 tablet (40 mg total) by mouth daily as needed.  . fexofenadine (ALLEGRA) 180 MG tablet Take 1 tablet (180 mg total) by mouth daily.  . fluticasone (FLONASE) 50 MCG/ACT nasal spray Place 2 sprays into both nostrils daily.  . Fluticasone-Salmeterol (ADVAIR DISKUS) 250-50 MCG/DOSE AEPB Inhale 1 puff into the lungs 2 (two) times daily.  . furosemide (LASIX) 40 MG tablet Take 1 tablet (40 mg total) by mouth daily.  . insulin lispro protamine-insulin lispro (HUMALOG 75/25) (75-25) 100 UNIT/ML SUSP Inject 15-22 Units into the skin See admin instructions. Takes 16 units in the morning, 10 units at lunch, and 25  units with supper  . isosorbide mononitrate (IMDUR) 60 MG 24 hr tablet Take 1.5 tablets (90 mg total) by mouth daily.  . lansoprazole (PREVACID) 30 MG capsule Take 1 capsule (30 mg total) by mouth 2 (two) times daily before a meal.  . loperamide (IMODIUM) 2 MG capsule Take 2 mg by mouth as needed for diarrhea or loose stools.   . meclizine (ANTIVERT) 25 MG tablet Take 1 tablet (25 mg total) by mouth 2 (two) times daily as needed for dizziness.  . metoprolol succinate (TOPROL-XL) 100 MG 24 hr tablet Take 1 tablet (100 mg total) by mouth daily.  . montelukast (SINGULAIR) 5 MG chewable tablet CHEW 1 TABLET (5 MG TOTAL) BY MOUTH AT BEDTIME.  . Multiple Vitamin (MULTIVITAMIN WITH MINERALS) TABS Take 1 tablet by mouth daily.   . Nebulizers (COMPRESSOR/NEBULIZER) MISC Use with albuterol  .  nitroGLYCERIN (NITROSTAT) 0.4 MG SL tablet Place 0.4 mg under the tongue every 5 (five) minutes as needed for chest pain.  . nortriptyline (PAMELOR) 75 MG capsule TAKE 1 CAPSULE (75 MG TOTAL) BY MOUTH AT BEDTIME.  . ONE TOUCH ULTRA TEST test strip CHECK BLOOD SUGAR TWICE A DAY DX: E11.9  . ONETOUCH DELICA LANCETS 76F MISC 3 (three) times daily. for testing  . Spacer/Aero-Holding Chambers (AEROCHAMBER MV) inhaler Use as instructed  . Tiotropium Bromide Monohydrate (SPIRIVA RESPIMAT) 2.5 MCG/ACT AERS Inhale 2 puffs into the lungs daily.   Facility-Administered Encounter Medications as of 10/02/2018  Medication  . cyanocobalamin ((VITAMIN B-12)) injection 1,000 mcg     Review of Systems  Review of Systems   Physical Exam  LMP  (LMP Unknown)   Wt Readings from Last 5 Encounters:  09/29/18 207 lb (93.9 kg)  08/24/18 208 lb (94.3 kg)  08/21/18 187 lb (84.8 kg)  08/17/18 187 lb (84.8 kg)  08/01/18 209 lb (94.8 kg)     Physical Exam   Lab Results:  CBC    Component Value Date/Time   WBC 6.0 12/20/2017 0740   RBC 4.54 12/20/2017 0740   HGB 11.2 (L) 12/20/2017 0740   HGB 10.6 (L) 12/10/2016 1346   HCT 35.1 (L) 12/20/2017 0740   HCT 34.1 12/10/2016 1346   PLT 285.0 12/20/2017 0740   PLT 270 12/10/2016 1346   MCV 77.3 (L) 12/20/2017 0740   MCV 78 (L) 12/10/2016 1346   MCH 23.9 (L) 01/04/2017 1505   MCHC 31.8 12/20/2017 0740   RDW 17.6 (H) 12/20/2017 0740   RDW 16.1 (H) 12/10/2016 1346   LYMPHSABS 2.1 12/20/2017 0740   LYMPHSABS 2.2 06/21/2016 1628   MONOABS 0.5 12/20/2017 0740   EOSABS 0.2 12/20/2017 0740   EOSABS 0.1 06/21/2016 1628   BASOSABS 0.0 12/20/2017 0740   BASOSABS 0.0 06/21/2016 1628    BMET    Component Value Date/Time   NA 143 12/20/2017 0740   NA 144 05/19/2017 1444   K 4.0 12/20/2017 0740   CL 104 12/20/2017 0740   CO2 28 12/20/2017 0740   GLUCOSE 151 (H) 12/20/2017 0740   BUN 22  12/20/2017 0740   BUN 15 05/19/2017 1444   CREATININE 0.83 12/20/2017 0740   CALCIUM 9.0 12/20/2017 0740   GFRNONAA 70 05/19/2017 1444   GFRAA 80 05/19/2017 1444    BNP    Component Value Date/Time   BNP 315.0 (H) 01/04/2017 1505    ProBNP    Component Value Date/Time   PROBNP 128 06/21/2016 1628   PROBNP 158.0 (H) 04/17/2012 1123      Assessment & Plan:   No problem-specific Assessment & Plan notes found for this encounter.    No follow-ups on file.   Lauraine Rinne, NP 10/02/2018   This appointment was *** minutes long with over 50% of the time in direct face-to-face patient care, assessment, plan of care, and follow-up.

## 2018-10-02 NOTE — Telephone Encounter (Signed)
Patient walked into the office today thinking she had an appointment today with Aaron Edelman at [facility]. Reviewed patient's chart, it appears that her OV was canceled back in July and rescheduled for Sept 22nd. Patient wanted to be seen today by Aaron Edelman only. Due to Aaron Edelman not having any appointments this morning, patient was offered a 230pm appt. She took this appt.   Patient has been scheduled for 230pm this afternoon.

## 2018-10-22 ENCOUNTER — Other Ambulatory Visit: Payer: Self-pay | Admitting: Neurology

## 2018-10-23 NOTE — Telephone Encounter (Signed)
Requested Prescriptions   Pending Prescriptions Disp Refills  . nortriptyline (PAMELOR) 75 MG capsule [Pharmacy Med Name: NORTRIPTYLINE HCL 75 MG CAP] 90 capsule 2    Sig: TAKE 1 CAPSULE (75 MG TOTAL) BY MOUTH AT BEDTIME.   Rx last filled: 05/10/18 #90 2 REFILLS  Pt last seen: 08/17/18   Follow up appt scheduled:12/21/18

## 2018-10-27 NOTE — Progress Notes (Signed)
_0  ID: [name], female    DOB: 1941-01-26, 78 y.o.   MRN: [id number]  Chief Complaint  Patient presents with   Follow-up    She reports has been more SOB more frequently. Reports she is using the albuterol 2-3x/day for the last 2 months. Confirms she is using Advair and Spiriva daily. Cough with thick white mucous. Reports increased chest tightness and increased wheezing.     Referring provider: Binnie Rail, MD  HPI:  78 year old female former smoker followed in our office for COPD asthma overlap syndrome  PMH: Hypertension, type 1 diabetes, pancreatic insufficiency, GERD, hyperlipidemia Smoker/ Smoking History: Former smoker.  10.5-pack-year smoking history.  Quit 1978. Maintenance: Advair 250, Spiriva Resp 2.5  Pt of: Dr. Lake Bells  10/31/2018  - Visit   78 year old female former smoker followed in our office for COPD asthma overlap syndrome.  She was previously managed by Dr. Lake Bells.  Her maintenance inhalers have been Advair 250 as well as Spiriva Respimat 2.5.  She persistently struggles with blood sugar management.  She this is managed by Dr. Michiel Sites her endocrinologist in [facility].  Per patient she was diagnosed as a type I diabetic as an adult.  Her blood sugars typically range between 204 100 daily.  She reports that yesterday her blood sugar was 313.  She reports that her endocrinologist currently believes right now that she likely needs to have her insulin switched as this insulin is no longer working for her.  She reports that she is working with endocrinology on getting this fixed currently this week.  She is also followed by Dr. Quay Burow for primary care.  Dr. Quay Burow is also been working with her on management of her acid reflux.  Patient reports that she feels that she is been having more audible wheezing since last office visit.  She reports that even her friends around her have noticed that she has had more audible wheezing.  Patient does not  currently take her Allegra or her Flonase.  She does still take her Singulair.  She reports daily acid reflux.  She continues to use her inhalers as prescribed.  She is using her rescue inhaler 2-4 times a day.  She was previously seen by neurology for headaches and they believe patient may have obstructive sleep apnea.  2019 home sleep study showed the patient did not have obstructive sleep apnea.  Patient reports today that she felt that was a poor test because she only slept for about an hour.  Patient was recommended after 2019 sleep study to be maintained on nocturnal oxygen.  Patient reports she has not been using this.   Questionaires / Pulmonary Flowsheets:   ACT:  No flowsheet data found.  MMRC: No flowsheet data found.  Epworth:  No flowsheet data found.  Tests:   PFT 12/23/16: FVC 1.44 L (72%) FEV1 0.96 L (63%) FEV1/FVC 0.67 FEF 25-75 0.55 L (42%) negative bronchodilator response TLC 3.85 L (78%) RV 102% DLCO uncorrected 45% 06/04/15: FVC 1.57 L (77%) FEV1 1.01 L (64%) FEV1/FVC 0.65 FEF 25-75 0.54 L (39%) negative bronchodilator response 02/21/15: FVC 1.60 L (78%) FEV1 1.03 L (65%) FEV1/FVC 0.64 FEF 25-75 0.51 L (37%) positive bronchodilator response 11/20/14: FVC 1.44 L (70%) FEV1 0.91 L (57%) FEV1/FVC 0.63 FEF 25-75 0.47 L (33%) positive bronchodilator response TLC 3.92 L (79%) RV 103% DLCO uncorrected 64% 11/12/13: FVC 1.57 L (74%) FEV1 1.09 L (67%) FEV1/FVC 0.70 FEF 25-75 0.72 L (50%) negative bronchodilator response 08/04/11:  FVC 1.61 L (60%) FEV1 0.89 L (48%) FEV1/FVC 0.56 FEF 25-75 0.29 L (14%) positive bronchodilator response TLC 3.67 L (80%) RV 103% ERV 46% DLCO corrected 38%  6MWT 11/20/14: Walked 336 meters / Baseline Sat 100% on RA / Nadir Sat 99% on RA (pt c/o pain in her right neck w/ dyspnea)  IMAGING November 2018 CT angiogram chest images showing centrilobular predominant emphysema, mild in an upper lobe distribution, no PE, there is a 5 mm right upper  lobe nodule. 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 LIMA to LAD is patent.  Prox RCA lesion is 25% stenosed. SVG to PDA is occluded.  Mid RCA lesion is 25% stenosed.  Circumflex is a large tortuous vessel.  Ost 1st Diag lesion is 100% stenosed. SVG to diagonal is patent.  The left ventricular systolic function is normal.  LV end diastolic pressure is normal.  The left ventricular ejection fraction is 55-65% by visual estimate.  There is no aortic valve stenosis.  TTE (10/06/11): LV normal in size. Normal regional wall motion. EF 60-65%. Grade 1 diastolic dysfunction. LA & RA normal in size. 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 RV normal in size and function. RVSP 19 mmHg. No aortic stenosis or regurgitation. No mitral stenosis or regurgitation. No pulmonic stenosis. Trivial tricuspid regurgitation. No pericardial effusion.  LABS  10/08/14 IgG: 1060 IgM: 112 IgA: 272 IgE: 5 CBC: Differential: Eos 0.3 (4.5%)  RAST panel: Negative Aspergillus antigen: <0.1  08/07/2018-respiratory sputum- Pseudomonas [place], Escherichia coli >>>PA-sensitive to Levaquin, ciprofloxacin >>>E. Coli: >>>resistant to Augmentin, ampicillin, ampicillin sulbactam, Zosyn >>>Sensitive: Levaquin, ciprofloxacin, Bactrim  08/07/2018-fungal culture-- many budding yeast with pseudohyphae seen, Candida tropicalis, Candida albicans  Sleep study: 03/2017 Home sleep study showed no sleep apnea but her oxygen level dropped to 2L [place]   FENO:  Lab Results  Component Value Date   NITRICOXIDE 7 05/05/2017    PFT: PFT Results Latest Ref Rng & Units 12/23/2016 06/04/2015 02/21/2015 11/20/2014 11/12/2013  FVC-Pre L 1.44 1.57 1.60 1.44 1.57  FVC-Predicted Pre % 72 77 78 70 74  FVC-Post L 1.50 1.63 1.82 1.73 1.66  FVC-Predicted Post % 75 80 89 84 79  Pre FEV1/FVC % % 67 65 64 63 70  Post FEV1/FCV % % 68 69 67 70 71  FEV1-Pre L 0.96 1.01  1.03 0.91 1.09  FEV1-Predicted Pre % 63 64 65 57 67  FEV1-Post L 1.03 1.12 1.23 1.21 1.17  DLCO UNC% % 45 - - 64 -  DLCO COR %Predicted % 73 - - 98 -  TLC L 3.85 - - 3.92 -  TLC % Predicted % 78 - - 79 -  RV % Predicted % 102 - - 103 -    SIX MIN WALK 11/20/2014 08/08/2012  Medications none -  Supplimental Oxygen during Test? (L/min) No No  Laps 7 -  Partial Lap (in Meters) 0 -  Baseline BP (sitting) 142/84 -  Baseline Heartrate 89 -  Baseline Dyspnea (Borg Scale) 3 -  Baseline Fatigue (Borg Scale) 1 -  Baseline SPO2 100 -  BP (sitting) 172/92 -  Heartrate 125 -  Dyspnea (Borg Scale) 7 -  Fatigue (Borg Scale) 7 -  SPO2 99 -  BP (sitting) 158/82 -  Heartrate 98 -  SPO2 100 -  Stopped or Paused before Six Minutes Yes -  Other Symptoms at end of Exercise Heaviness in chest, severe pain in right side of neck,  SOB -  Interpretation Angina -  Distance Completed 336 -  Tech Comments: Patient stopped walking with 3:16 on the clock after 4 laps; patient wanted to continue walk but stopped again after 3 laps with 1:16 left on the clock.  Patinet was breathing heavily, grabbing neck, pain in right side of neck, pt states she had bypass surgery hx.  Pt concerned about stroke due to elevation of BP.   pt only able to do 1 lap d/t SOB    Imaging: No results found.  Lab Results:  CBC    Component Value Date/Time   WBC 6.0 12/20/2017 0740   RBC 4.54 12/20/2017 0740   HGB 11.2 (L) 12/20/2017 0740   HGB 10.6 (L) 12/10/2016 1346   HCT 35.1 (L) 12/20/2017 0740   HCT 34.1 12/10/2016 1346   PLT 285.0 12/20/2017 0740   PLT 270 12/10/2016 1346   MCV 77.3 (L) 12/20/2017 0740   MCV 78 (L) 12/10/2016 1346   MCH 23.9 (L) 01/04/2017 1505   MCHC 31.8 12/20/2017 0740   RDW 17.6 (H) 12/20/2017 0740   RDW 16.1 (H) 12/10/2016 1346   LYMPHSABS 2.1 12/20/2017 0740   LYMPHSABS 2.2 06/21/2016 1628   MONOABS 0.5 12/20/2017 0740   EOSABS 0.2 12/20/2017 0740   EOSABS 0.1 06/21/2016 1628   BASOSABS  0.0 12/20/2017 0740   BASOSABS 0.0 06/21/2016 1628    BMET    Component Value Date/Time   NA 143 12/20/2017 0740   NA 144 05/19/2017 1444   K 4.0 12/20/2017 0740   CL 104 12/20/2017 0740   CO2 28 12/20/2017 0740   GLUCOSE 151 (H) 12/20/2017 0740   BUN 22 12/20/2017 0740   BUN 15 05/19/2017 1444   CREATININE 0.83 12/20/2017 0740   CALCIUM 9.0 12/20/2017 0740   GFRNONAA 70 05/19/2017 1444   GFRAA 80 05/19/2017 1444    BNP    Component Value Date/Time   BNP 315.0 (H) 01/04/2017 1505    ProBNP    Component Value Date/Time   PROBNP 128 06/21/2016 1628   PROBNP 158.0 (H) 04/17/2012 1123    Specialty Problems      Pulmonary Problems   Cough    Qualifier: Diagnosis of  By: Joya Gaskins MD, Burnett Harry       Extrinsic asthma    Severe persistent ASthma 08/04/2011  PFTs:  FeV1 48%  FVC 60%  25% improvement with BD TLC 80%  DLCO 38% ONO 08/08/2012 >>+nocturnal desats >40% <88% O2 sat >begin O2 2l/m At bedtime         OSA (obstructive sleep apnea) with hypoxia    Sleep study: 03/2017 Home sleep study showed no sleep apnea but her oxygen level dropped to 2L [place]      Acute bronchitis with COPD (HCC)   COPD with asthma ([place])    12/23/16: FVC 1.44 L (72%) FEV1 0.96 L (63%) FEV1/FVC 0.67 FEF 25-75 0.55 L (42%) negative bronchodilator response TLC 3.85 L (78%) RV 102% DLCO uncorrected 45%  November 2018 CT angiogram chest images showing centrilobular predominant emphysema, mild in an upper lobe distribution, no PE, there is a 5 mm right upper lobe nodule. Significant bronchomalacia noted R>L  2016 -  CBC: Differential: Eos 0.3 (4.5%)  RAST panel: Negative         Allergies  Allergen Reactions   Amlodipine Other (See Comments)    hallucinations    Banana Nausea And Vomiting    Stomach pumped   Co Q10 [Coenzyme Q10]  Body cramps   Codeine     Hallucinate, loose identity and don't know who I am   Metformin And Related Nausea And Vomiting   Morphine Other (See  Comments)    Can not function, it immobilizes me    Pentazocine Nausea And Vomiting   Pravastatin     Hands locked up   Repatha [Evolocumab]     myalgias   Statins     Muscle cramps   Sulfa Antibiotics Swelling   Sulfonamide Derivatives Swelling   Tramadol Hcl     Immunization History  Administered Date(s) Administered   Fluad Quad(high Dose 65+) 09/29/2018   Influenza Split 12/15/2011, 11/08/2012, 11/19/2014   Influenza Whole 10/09/2008   Influenza, High Dose Seasonal PF 11/19/2015, 11/23/2016, 10/26/2017   Influenza-Unspecified 10/29/2013   Pneumococcal Conjugate-13 09/03/2015   Pneumococcal Polysaccharide-23 02/09/2004   Pneumococcal-Unspecified 02/09/2012    Past Medical History:  Diagnosis Date   Adenomatous colon polyp    Allergy    Anxiety    Asthma    Chest pain    Chronic diastolic CHF (congestive heart failure) ([place])    COPD (chronic obstructive pulmonary disease) ([place])    pt is unsure if has been officially diagnosed   Coronary artery disease    CABG '09- cathed 12/09, 9/10, 6/11, 3/14 and 12/13/16- medical Rx   Diabetes mellitus    GERD (gastroesophageal reflux disease)    Hiatal hernia    Hyperlipidemia    Hypertension    Myocardial infarction ([place]) 2009   PONV (postoperative nausea and vomiting)    Schatzki's ring    Shoulder injury    resolved after shoulder surgery   Sleep apnea    not on cpap    Tobacco History: Social History   Tobacco Use  Smoking Status Former Smoker   Packs/day: 0.50   Years: 21.00   Pack years: 10.50   Types: Cigarettes   Start date: 02/09/1955   Quit date: 02/09/1976   Years since quitting: 42.7  Smokeless Tobacco Never Used   Counseling given: Yes   Continue to not smoke  Outpatient Encounter Medications as of 10/31/2018  Medication Sig   albuterol (PROVENTIL HFA;VENTOLIN HFA) 108 (90 BASE) MCG/ACT inhaler Inhale 2 puffs into the lungs every 6 (six) hours as needed.  For shortness of breath.   albuterol (PROVENTIL) (2.5 MG/3ML) 0.083% nebulizer solution USE 1 VIAL IN NEBULIZER EVERY 6 HOURS - and as needed   alum & mag hydroxide-simeth (MAALOX/MYLANTA) 200-200-20 MG/5ML suspension Take 30 mLs as needed by mouth for indigestion or heartburn.   aspirin EC 81 MG tablet Take 1 tablet (81 mg total) by mouth daily.   baclofen (LIORESAL) 20 MG tablet Take 1 tablet (20 mg total) by mouth 3 (three) times daily. Need office visit for more refills   Blood Glucose Monitoring Suppl (ONE TOUCH ULTRA MINI) w/Device KIT 3 (three) times daily. for testing   calcium carbonate (TUMS - DOSED IN MG ELEMENTAL CALCIUM) 500 MG chewable tablet Chew 2 tablets daily as needed by mouth for indigestion or heartburn.   Cholecalciferol (VITAMIN D) 2000 UNITS tablet Take 2,000 Units by mouth daily.    clonazePAM (KLONOPIN) 0.5 MG tablet Take 0.5 mg daily by mouth.   CREON 24000-76000 units CPEP TAKE 2 CAPSULES BY MOUTH 3 TIMES A DAY WITH MEALS (TAK 1 CAPSULE WITH A SNACK)   diclofenac sodium (VOLTAREN) 1 % GEL Apply 2 g topically 4 (four) times daily as needed (muscle pain).   diltiazem (CARDIZEM CD)  240 MG 24 hr capsule TAKE 1 CAPSULE BY MOUTH EVERY DAY   ezetimibe (ZETIA) 10 MG tablet Take 10 mg by mouth daily.   famotidine (PEPCID) 40 MG tablet Take 1 tablet (40 mg total) by mouth daily as needed.   fexofenadine (ALLEGRA) 180 MG tablet Take 1 tablet (180 mg total) by mouth daily.   fluticasone (FLONASE) 50 MCG/ACT nasal spray Place 2 sprays into both nostrils daily.   Fluticasone-Salmeterol (ADVAIR DISKUS) 250-50 MCG/DOSE AEPB Inhale 1 puff into the lungs 2 (two) times daily.   furosemide (LASIX) 40 MG tablet Take 1 tablet (40 mg total) by mouth daily.   insulin lispro protamine-insulin lispro (HUMALOG 75/25) (75-25) 100 UNIT/ML SUSP Inject 15-22 Units into the skin See admin instructions. Takes 16 units in the morning, 10 units at lunch, and 25  units with supper    isosorbide mononitrate (IMDUR) 60 MG 24 hr tablet Take 1.5 tablets (90 mg total) by mouth daily.   lansoprazole (PREVACID) 30 MG capsule Take 1 capsule (30 mg total) by mouth 2 (two) times daily before a meal.   loperamide (IMODIUM) 2 MG capsule Take 2 mg by mouth as needed for diarrhea or loose stools.    meclizine (ANTIVERT) 25 MG tablet Take 1 tablet (25 mg total) by mouth 2 (two) times daily as needed for dizziness.   metoprolol succinate (TOPROL-XL) 100 MG 24 hr tablet Take 1 tablet (100 mg total) by mouth daily.   montelukast (SINGULAIR) 5 MG chewable tablet CHEW 1 TABLET (5 MG TOTAL) BY MOUTH AT BEDTIME.   Multiple Vitamin (MULTIVITAMIN WITH MINERALS) TABS Take 1 tablet by mouth daily.   Nebulizers (COMPRESSOR/NEBULIZER) MISC Use with albuterol   nitroGLYCERIN (NITROSTAT) 0.4 MG SL tablet Place 0.4 mg under the tongue every 5 (five) minutes as needed for chest pain.   nortriptyline (PAMELOR) 75 MG capsule TAKE 1 CAPSULE (75 MG TOTAL) BY MOUTH AT BEDTIME.   ONE TOUCH ULTRA TEST test strip CHECK BLOOD SUGAR TWICE A DAY DX: H47.4   ONETOUCH DELICA LANCETS 25Z MISC 3 (three) times daily. for testing   Spacer/Aero-Holding Chambers (AEROCHAMBER MV) inhaler Use as instructed   Tiotropium Bromide Monohydrate (SPIRIVA RESPIMAT) 2.5 MCG/ACT AERS Inhale 2 puffs into the lungs daily.   [DISCONTINUED] fexofenadine (ALLEGRA) 180 MG tablet Take 1 tablet (180 mg total) by mouth daily.   [DISCONTINUED] fluticasone (FLONASE) 50 MCG/ACT nasal spray Place 2 sprays into both nostrils daily.   Facility-Administered Encounter Medications as of 10/31/2018  Medication   cyanocobalamin ((VITAMIN B-12)) injection 1,000 mcg     Review of Systems  Review of Systems  Constitutional: Negative for activity change, fatigue and fever.  HENT: Negative for sinus pressure, sinus pain and sore throat.   Eyes: Positive for itching.  Respiratory: Positive for cough and wheezing. Negative for shortness of  breath.   Cardiovascular: Negative for chest pain and palpitations.  Gastrointestinal: Negative for diarrhea, nausea and vomiting.  Musculoskeletal: Negative for arthralgias.  Neurological: Negative for dizziness.  Psychiatric/Behavioral: Negative for sleep disturbance. The patient is not nervous/anxious.      Physical Exam  BP 128/78    Pulse 95    Temp (!) 97 F (36.1 C) (Temporal)    Ht _0  (1.575 m)    Wt 206 lb 6.4 oz (93.6 kg)    LMP  (LMP Unknown)    SpO2 98%    BMI 37.75 kg/m   Wt Readings from Last 5 Encounters:  10/31/18 206 lb 6.4 oz (93.6  kg)  09/29/18 207 lb (93.9 kg)  08/24/18 208 lb (94.3 kg)  08/21/18 187 lb (84.8 kg)  08/17/18 187 lb (84.8 kg)    BMI Readings from Last 5 Encounters:  10/31/18 37.75 kg/m  09/29/18 37.86 kg/m  08/24/18 38.04 kg/m  08/21/18 34.20 kg/m  08/17/18 34.20 kg/m     Physical Exam Vitals signs and nursing note reviewed.  Constitutional:      General: She is not in acute distress.    Appearance: Normal appearance. She is obese.  HENT:     Head: Normocephalic and atraumatic.     Right Ear: Tympanic membrane, ear canal and external ear normal. There is no impacted cerumen.     Left Ear: Tympanic membrane, ear canal and external ear normal. There is no impacted cerumen.     Nose: Congestion and rhinorrhea present.     Mouth/Throat:     Mouth: Mucous membranes are moist.     Pharynx: Oropharynx is clear.     Comments: Postnasal drip Eyes:     Pupils: Pupils are equal, round, and reactive to light.  Neck:     Musculoskeletal: Normal range of motion.  Cardiovascular:     Rate and Rhythm: Normal rate and regular rhythm.     Pulses: Normal pulses.     Heart sounds: Normal heart sounds. No murmur.  Pulmonary:     Effort: Pulmonary effort is normal. No respiratory distress.     Breath sounds: Normal breath sounds. No decreased air movement. No decreased breath sounds, wheezing or rales.  Abdominal:     General: Abdomen is  flat. Bowel sounds are normal.     Palpations: Abdomen is soft.  Skin:    General: Skin is warm and dry.     Capillary Refill: Capillary refill takes less than 2 seconds.  Neurological:     General: No focal deficit present.     Mental Status: She is alert and oriented to person, place, and time. Mental status is at baseline.     Gait: Gait normal.  Psychiatric:        Mood and Affect: Mood normal.        Behavior: Behavior normal.        Thought Content: Thought content normal.        Judgment: Judgment normal.       Assessment & Plan:   OSA (obstructive sleep apnea) with hypoxia Discussion: Neurology is under the impression patient may have obstructive sleep apnea.  Patient had sleep study in February/2019 which was negative for sleep apnea but patient only slept a limited amount.  Patient did have oxygen desaturations and required 2 L via nasal cannula.  Patient has not been wearing her oxygen at night.  There is a chance the patient does not fact not have sleep apnea but is simply been hypoxic based off not wearing oxygen at night.  We will further evaluate by ordering another home sleep study to further evaluate.  Plan: Home sleep study Please start wearing 2 L of O2 as previously instructed   COPD with asthma ([place]) Assessment: 2018 pulmonary function test shows COPD Gold 2, FEV1 0.96 2018 CTA shows centrilobular predominant emphysema Managed on Advair and Spiriva Respimat 2.5 Patient had peripheral eosinophilia on blood work in the past, RAST panel negative, IgE 5 in 2016 Lung sounds clear on exam today although slightly diminished throughout   Plan: Continue Advair 250 Continue Spiriva Respimat 2.5 Use albuterol nebulized meds every 4-6 hours as  needed for shortness of breath and wheezing Continue to work diligently on exercise Will have patient establish with Continue taking daily Singulair Resume taking Allegra Resume Flonase   GERD Discussion: Patient  continues to have persistent daily acid reflux.  Patient has been using PPI as well as H2 managed by primary care.  She has not followed back up with primary care to let them know that she is continuing to have symptoms.  I suspect the patient may actually have gastroparesis based off of her persistently elevated and poorly controlled diabetes.  Patient specifically reports that she has difficulty with digesting and swallowing foods specifically uncooked vegetables or fibrous vegetables.  I believe the patient may benefit from gastroenterology referral.  Will refer back to primary care as they have been managing.  Plan: Continue taking Prevacid as prescribed Continue taking Pepcid as prescribed Follow back up with primary care regarding your symptoms Discussed with them our thoughts that you may benefit from a gastroenterology referral    Type 1 diabetes mellitus ([place]) Assessment: Patient reporting blood sugars are still ranging between 300-400 over the last week Managed by Dr. Michiel Sites her endocrinologist  Plan: Patient needs to contact endocrinology today to notify the blood sugars Patient to complete follow-up with primary care today Continue low-carb diet Patient would benefit from nutritional therapy or a diabetic educator, patient to discuss with primary care today If patient starts to need prednisone tapers to help manage her COPD we will need to consider starting biologic therapies    Return in about 1 week (around 11/07/2018), or if symptoms worsen or fail to improve, for Follow up with Dr. Vaughan Browner.   Lauraine Rinne, NP 10/31/2018   This appointment was 26 minutes long with over 50% of the time in direct face-to-face patient care, assessment, plan of care, and follow-up.

## 2018-10-31 ENCOUNTER — Other Ambulatory Visit: Payer: Self-pay

## 2018-10-31 ENCOUNTER — Ambulatory Visit (INDEPENDENT_AMBULATORY_CARE_PROVIDER_SITE_OTHER): Payer: Medicare Other | Admitting: Pulmonary Disease

## 2018-10-31 ENCOUNTER — Encounter: Payer: Self-pay | Admitting: Pulmonary Disease

## 2018-10-31 VITALS — BP 128/78 | HR 95 | Temp 97.0°F | Ht 62.0 in | Wt 206.4 lb

## 2018-10-31 DIAGNOSIS — E1059 Type 1 diabetes mellitus with other circulatory complications: Secondary | ICD-10-CM | POA: Diagnosis not present

## 2018-10-31 DIAGNOSIS — G4733 Obstructive sleep apnea (adult) (pediatric): Secondary | ICD-10-CM | POA: Diagnosis not present

## 2018-10-31 DIAGNOSIS — J449 Chronic obstructive pulmonary disease, unspecified: Secondary | ICD-10-CM

## 2018-10-31 DIAGNOSIS — K219 Gastro-esophageal reflux disease without esophagitis: Secondary | ICD-10-CM | POA: Diagnosis not present

## 2018-10-31 MED ORDER — FLUTICASONE PROPIONATE 50 MCG/ACT NA SUSP: 2.0000 | Freq: Every day | NASAL | 6 refills | Status: DC

## 2018-10-31 MED ORDER — FEXOFENADINE HCL 180 MG PO TABS: 180.0000 mg | ORAL_TABLET | Freq: Every day | ORAL | 6 refills | Status: DC

## 2018-10-31 NOTE — Assessment & Plan Note (Addendum)
Assessment: 2018 pulmonary function test shows COPD Gold 2, FEV1 0.96 2018 CTA shows centrilobular predominant emphysema Managed on Advair and Spiriva Respimat 2.5 Patient had peripheral eosinophilia on blood work in the past, RAST panel negative, IgE 5 in 2016 Lung sounds clear on exam today although slightly diminished throughout   Plan: Continue Advair 250 Continue Spiriva Respimat 2.5 Use albuterol nebulized meds every 4-6 hours as needed for shortness of breath and wheezing Continue to work diligently on exercise Will have patient establish with Continue taking daily Singulair Resume taking Allegra Resume Flonase

## 2018-10-31 NOTE — Assessment & Plan Note (Signed)
Discussion: Patient continues to have persistent daily acid reflux.  Patient has been using PPI as well as H2 managed by primary care.  She has not followed back up with primary care to let them know that she is continuing to have symptoms.  I suspect the patient may actually have gastroparesis based off of her persistently elevated and poorly controlled diabetes.  Patient specifically reports that she has difficulty with digesting and swallowing foods specifically uncooked vegetables or fibrous vegetables.  I believe the patient may benefit from gastroenterology referral.  Will refer back to primary care as they have been managing.  Plan: Continue taking Prevacid as prescribed Continue taking Pepcid as prescribed Follow back up with primary care regarding your symptoms Discussed with them our thoughts that you may benefit from a gastroenterology referral

## 2018-10-31 NOTE — Patient Instructions (Addendum)
You were seen today by Lauraine Rinne, NP  for:   1. COPD with asthma (HCC)  Spiriva Respimat 2.5 >>> 2 puffs daily >>> Do this every day >>>This is not a rescue inhaler  Continue taking Advair as prescribed  Use albuterol nebulized medication every 4-6 hours as needed for shortness of breath or wheezing  Resume taking daily Allegra Resume using Flonase Continue taking Singulair  We will have you come back in for close follow-up to establish with Dr. Vaughan Browner  2. Type 1 diabetes mellitus with other circulatory complication ([place])  Please follow-up with endocrinology.  I would like for those records to be faxed to our office.  3. Gastroesophageal reflux disease, esophagitis presence not specified  Continue Prevacid as prescribed Continue Pepcid as prescribed Follow-up with primary care notify that you are still having daily acid reflux events  I suspect she may actually be having something called gastroparesis due to your elevated blood sugars and may benefit from a gastroenterology referral  4. OSA (obstructive sleep apnea) with hypoxia  We will reorder a home sleep study for you to reevaluate your obstructive sleep apnea based off neurology's recommendations  Continue 2 L of O2 at night for the time being   We recommend today:  No orders of the defined types were placed in this encounter.  No orders of the defined types were placed in this encounter.  No orders of the defined types were placed in this encounter.   Follow Up:    No follow-ups on file.   Please do your part to reduce the spread of COVID-19:      Reduce your risk of any infection  and COVID19 by using the similar precautions used for avoiding the common cold or flu:  Marland Kitchen Wash your hands often with soap and warm water for at least 20 seconds.  If soap and water are not readily available, use an alcohol-based hand sanitizer with at least 60% alcohol.  . If coughing or sneezing, cover your mouth and  nose by coughing or sneezing into the elbow areas of your shirt or coat, into a tissue or into your sleeve (not your hands). Langley Gauss A MASK when in public  . Avoid shaking hands with others and consider head nods or verbal greetings only. . Avoid touching your eyes, nose, or mouth with unwashed hands.  . Avoid close contact with people who are sick. . Avoid places or events with large numbers of people in one location, like concerts or sporting events. . If you have some symptoms but not all symptoms, continue to monitor at home and seek medical attention if your symptoms worsen. . If you are having a medical emergency, call 911.   [place] / e-Visit: [email or web address]         [facility]: [facility]: [id number]                   [facility]: [id number]     It is flu season:   >>> Best ways to protect herself from the flu: Receive the yearly flu vaccine, practice good hand hygiene washing with soap and also using hand sanitizer when available, eat a nutritious meals, get adequate rest, hydrate appropriately   Please contact the office if your symptoms worsen or you have concerns that you are not improving.   Thank you for choosing [facility] for your healthcare,  and for allowing [place] to partner with you on your healthcare journey. I am thankful to be able to provide care to you today.   Wyn Quaker FNP-C

## 2018-10-31 NOTE — Assessment & Plan Note (Addendum)
Discussion: Neurology is under the impression patient may have obstructive sleep apnea.  Patient had sleep study in February/2019 which was negative for sleep apnea but patient only slept a limited amount.  Patient did have oxygen desaturations and required 2 L via nasal cannula.  Patient has not been wearing her oxygen at night.  There is a chance the patient does not fact not have sleep apnea but is simply been hypoxic based off not wearing oxygen at night.  We will further evaluate by ordering another home sleep study to further evaluate.  Plan: Home sleep study Please start wearing 2 L of O2 as previously instructed

## 2018-10-31 NOTE — Assessment & Plan Note (Signed)
Assessment: Patient reporting blood sugars are still ranging between 300-400 over the last week Managed by Dr. Michiel Sites her endocrinologist  Plan: Patient needs to contact endocrinology today to notify the blood sugars Patient to complete follow-up with primary care today Continue low-carb diet Patient would benefit from nutritional therapy or a diabetic educator, patient to discuss with primary care today If patient starts to need prednisone tapers to help manage her COPD we will need to consider starting biologic therapies

## 2018-11-01 NOTE — Progress Notes (Signed)
Reviewed, agree 

## 2018-11-02 ENCOUNTER — Telehealth: Payer: Self-pay | Admitting: Internal Medicine

## 2018-11-02 NOTE — Telephone Encounter (Signed)
noted 

## 2018-11-02 NOTE — Telephone Encounter (Signed)
Pt states she will hold off on referral for now. Will call back if she changes her mind.

## 2018-11-02 NOTE — Telephone Encounter (Signed)
She recently saw pulmonary and she told Aaron Edelman that she was having persistent stomach/heartburn symptoms -  I would like to refer her to GI if she agrees.

## 2018-11-03 ENCOUNTER — Ambulatory Visit (INDEPENDENT_AMBULATORY_CARE_PROVIDER_SITE_OTHER): Payer: Medicare Other | Admitting: Pulmonary Disease

## 2018-11-03 ENCOUNTER — Encounter: Payer: Self-pay | Admitting: Pulmonary Disease

## 2018-11-03 ENCOUNTER — Other Ambulatory Visit: Payer: Self-pay

## 2018-11-03 VITALS — BP 118/68 | HR 72 | Temp 97.0°F | Ht 62.0 in | Wt 205.2 lb

## 2018-11-03 DIAGNOSIS — G4733 Obstructive sleep apnea (adult) (pediatric): Secondary | ICD-10-CM

## 2018-11-03 NOTE — Progress Notes (Signed)
[name]    [id number]    April 11, 1940  Primary Care Physician:Burns, Claudina Lick, MD  Referring Physician: Binnie Rail, MD [place],   [id number]  Chief complaint: Follow-up for COPD, asthma  HPI: [name] has history of COPD, asthma overlap syndrome, GERD, OSA.  Previously followed by Dr. Ashok Cordia and Dr. Shelbie Hutching on Advair, Spiriva.  Has daily symptoms of dyspnea.  Using albuterol every day No nighttime awakenings.  Reports symptom of GERD.  Declined referral to GI Has history of sleep apnea. CPAP machine was discontinued as patient was noncompliant although she claims that she had been using it regularly.  Follow-up sleep studies in 2018 was nondiagnostic as she slept 20 4/2 now. Home sleep study in 2019 did not demonstrate OSA but there is nocturnal hypoxemia.  Patient was prescribed O2 at night but is not using it regularly More recently seen by neurology for headaches which is thought to be secondary to sleep apnea.  She has started using the oxygen at night for the past week.  Pets: No pets Occupation: Retired Education officer, museum from [employer] Exposures: No known exposures.  No mold, hot tub, Jacuzzi, no down pillows or comforters. Smoking history: 10-pack-year smoker.  Quit around 1990 Travel history: No significant travel history Relevant family history: No significant family history of lung disease  Act score 11/03/2018-9  Outpatient Encounter Medications as of 11/03/2018  Medication Sig  . albuterol (PROVENTIL HFA;VENTOLIN HFA) 108 (90 BASE) MCG/ACT inhaler Inhale 2 puffs into the lungs every 6 (six) hours as needed. For shortness of breath.  Marland Kitchen albuterol (PROVENTIL) (2.5 MG/3ML) 0.083% nebulizer solution USE 1 VIAL IN NEBULIZER EVERY 6 HOURS - and as needed  . alum & mag hydroxide-simeth (MAALOX/MYLANTA) 200-200-20 MG/5ML suspension Take 30 mLs as needed by mouth for indigestion or heartburn.  Marland Kitchen aspirin EC 81 MG tablet Take 1  tablet (81 mg total) by mouth daily.  . baclofen (LIORESAL) 20 MG tablet Take 1 tablet (20 mg total) by mouth 3 (three) times daily. Need office visit for more refills  . Blood Glucose Monitoring Suppl (ONE TOUCH ULTRA MINI) w/Device KIT 3 (three) times daily. for testing  . calcium carbonate (TUMS - DOSED IN MG ELEMENTAL CALCIUM) 500 MG chewable tablet Chew 2 tablets daily as needed by mouth for indigestion or heartburn.  . Cholecalciferol (VITAMIN D) 2000 UNITS tablet Take 2,000 Units by mouth daily.   . clonazePAM (KLONOPIN) 0.5 MG tablet Take 0.5 mg daily by mouth.  . CREON 24000-76000 units CPEP TAKE 2 CAPSULES BY MOUTH 3 TIMES A DAY WITH MEALS (TAK 1 CAPSULE WITH A SNACK)  . diclofenac sodium (VOLTAREN) 1 % GEL Apply 2 g topically 4 (four) times daily as needed (muscle pain).  Marland Kitchen diltiazem (CARDIZEM CD) 240 MG 24 hr capsule TAKE 1 CAPSULE BY MOUTH EVERY DAY  . ezetimibe (ZETIA) 10 MG tablet Take 10 mg by mouth daily.  . famotidine (PEPCID) 40 MG tablet Take 1 tablet (40 mg total) by mouth daily as needed.  . fexofenadine (ALLEGRA) 180 MG tablet Take 1 tablet (180 mg total) by mouth daily.  . fluticasone (FLONASE) 50 MCG/ACT nasal spray Place 2 sprays into both nostrils daily.  . Fluticasone-Salmeterol (ADVAIR DISKUS) 250-50 MCG/DOSE AEPB Inhale 1 puff into the lungs 2 (two) times daily.  . furosemide (LASIX) 40 MG tablet Take 1 tablet (40 mg total) by mouth daily.  . insulin lispro protamine-insulin  lispro (HUMALOG 75/25) (75-25) 100 UNIT/ML SUSP Inject 15-22 Units into the skin See admin instructions. Takes 16 units in the morning, 10 units at lunch, and 25  units with supper  . isosorbide mononitrate (IMDUR) 60 MG 24 hr tablet Take 1.5 tablets (90 mg total) by mouth daily.  . lansoprazole (PREVACID) 30 MG capsule Take 1 capsule (30 mg total) by mouth 2 (two) times daily before a meal.  . loperamide (IMODIUM) 2 MG capsule Take 2 mg by mouth as needed for diarrhea or loose stools.   .  meclizine (ANTIVERT) 25 MG tablet Take 1 tablet (25 mg total) by mouth 2 (two) times daily as needed for dizziness.  . metoprolol succinate (TOPROL-XL) 100 MG 24 hr tablet Take 1 tablet (100 mg total) by mouth daily.  . montelukast (SINGULAIR) 5 MG chewable tablet CHEW 1 TABLET (5 MG TOTAL) BY MOUTH AT BEDTIME.  . Multiple Vitamin (MULTIVITAMIN WITH MINERALS) TABS Take 1 tablet by mouth daily.  . Nebulizers (COMPRESSOR/NEBULIZER) MISC Use with albuterol  . nitroGLYCERIN (NITROSTAT) 0.4 MG SL tablet Place 0.4 mg under the tongue every 5 (five) minutes as needed for chest pain.  . nortriptyline (PAMELOR) 75 MG capsule TAKE 1 CAPSULE (75 MG TOTAL) BY MOUTH AT BEDTIME.  . ONE TOUCH ULTRA TEST test strip CHECK BLOOD SUGAR TWICE A DAY DX: E11.9  . ONETOUCH DELICA LANCETS 19E MISC 3 (three) times daily. for testing  . Spacer/Aero-Holding Chambers (AEROCHAMBER MV) inhaler Use as instructed  . Tiotropium Bromide Monohydrate (SPIRIVA RESPIMAT) 2.5 MCG/ACT AERS Inhale 2 puffs into the lungs daily.   Facility-Administered Encounter Medications as of 11/03/2018  Medication  . cyanocobalamin ((VITAMIN B-12)) injection 1,000 mcg    Allergies as of 11/03/2018 - Review Complete 11/03/2018  Allergen Reaction Noted  . Amlodipine Other (See Comments) 08/20/2010  . Banana Nausea And Vomiting 11/01/2011  . Co q10 [coenzyme q10]    . Codeine    . Metformin and related Nausea And Vomiting 12/30/2017  . Morphine Other (See Comments)   . Pentazocine Nausea And Vomiting   . Pravastatin  01/25/2017  . Repatha [evolocumab]  12/23/2016  . Statins  11/19/2015  . Sulfa antibiotics Swelling   . Sulfonamide derivatives Swelling   . Tramadol hcl      Past Medical History:  Diagnosis Date  . Adenomatous colon polyp   . Allergy   . Anxiety   . Asthma   . Chest pain   . Chronic diastolic CHF (congestive heart failure) ([place])   . COPD (chronic obstructive pulmonary disease) ([place])    pt is unsure if has been  officially diagnosed  . Coronary artery disease    CABG '09- cathed 12/09, 9/10, 6/11, 3/14 and 12/13/16- medical Rx  . Diabetes mellitus   . GERD (gastroesophageal reflux disease)   . Hiatal hernia   . Hyperlipidemia   . Hypertension   . Myocardial infarction ([place]) 2009  . PONV (postoperative nausea and vomiting)   . Schatzki's ring   . Shoulder injury    resolved after shoulder surgery  . Sleep apnea    not on cpap    Past Surgical History:  Procedure Laterality Date  . ABDOMINAL HYSTERECTOMY    . APPENDECTOMY     came out with Hysterectomy  . CARDIAC CATHETERIZATION  07/23/2009   EF 60%  . CARDIAC CATHETERIZATION  10/11/2008  . CARDIAC CATHETERIZATION  03/01/2007   EF 75-80%  . CARDIOVASCULAR STRESS TEST  11/15/2007   EF 60%  .  COLONOSCOPY    . CORONARY ARTERY BYPASS GRAFT     SEVERELY DISEASED SAPHENOUS VEIN GRAFT TO THE RIGHT CORONARY ARTERY BUT WITH FAIRLY WELL PRESERVED FLOW TO THE DISTAL RIGHT CORONARY ARTERY FROM THE NATIVE CIRCULATION-RESTART  CATH IN JUNE 2000, REVEALS MILD/MODERATE  CAD WITH GOOD FLOW DOWN HER LAD  . ESOPHAGOGASTRODUODENOSCOPY    . EYE SURGERY     bilateral cataract surgery with lens implant  . LEFT HEART CATH AND CORS/GRAFTS ANGIOGRAPHY N/A 12/14/2016   Procedure: LEFT HEART CATH AND CORS/GRAFTS ANGIOGRAPHY;  Surgeon: Jettie Booze, MD;  Location: [facility];  Service: Cardiovascular;  Laterality: N/A;  . lense removal Left   . POLYPECTOMY    . ROTATOR CUFF REPAIR     right and left  . TONSILLECTOMY     age 18  . 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 tumor removed kidney    . UPPER GASTROINTESTINAL ENDOSCOPY    . US ECHOCARDIOGRAPHY  03/08/2008   EF 55-60%    Family History  Problem Relation Age of Onset  . Heart disease Maternal Grandfather   . Heart failure Maternal Grandfather   .  Diabetes Maternal Grandfather   . Heart attack Father   . Diabetes Mother   . Rheum arthritis Sister   . Emphysema Paternal Uncle   . Esophageal cancer Brother 43       she said he was born with it  . Emphysema Paternal Aunt   . Neuropathy Neg Hx   . Multiple sclerosis Neg Hx   . Colon cancer Neg Hx   . Colon polyps Neg Hx   . Rectal cancer Neg Hx   . Stomach cancer Neg Hx     Social History   Socioeconomic History  . Marital status: Widowed    Spouse name: Not on file  . Number of children: 4  . Years of education: Designer, jewellery  . Highest education level: Not on file  Occupational History  . Occupation: Retired  Scientific laboratory technician  . Financial resource strain: Very hard  . Food insecurity    Worry: Sometimes true    Inability: Sometimes true  . Transportation needs    Medical: No    Non-medical: No  Tobacco Use  . Smoking status: Former Smoker    Packs/day: 0.50    Years: 21.00    Pack years: 10.50    Types: Cigarettes    Start date: 02/09/1955    Quit date: 02/09/1976    Years since quitting: 42.7  . Smokeless tobacco: Never Used  Substance and Sexual Activity  . Alcohol use: No    Alcohol/week: 0.0 standard drinks  . Drug use: No  . Sexual activity: Never  Lifestyle  . Physical activity    Days per week: 3 days    Minutes per session: 40 min  . Stress: Only a little  Relationships  . Social connections    Talks on phone: More than three times a week    Gets together: More than three times a week    Attends religious service: More than 4 times per year    Active member of club or organization: Yes    Attends meetings of clubs or organizations: More than 4 times per year    Relationship status: Widowed  . Intimate partner violence    Fear of current or  ex partner: Not on file    Emotionally abused: Not on file    Physically abused: Not on file    Forced sexual activity: Not on file  Other Topics Concern  . Not on file  Social History Narrative   Lives alone.   One story home.  Has 4 children.  Education: doctorate in theology.    Caffeine use: Drinks 1 cup coffee/day      Originally from [place]. Previously has lived in [place]. Prior travel to [place], [place], [place], [place], [place], [place], [place], & [place]. Previously worked in [employer] She has a dog currently. No bird, mold, or hot tub exposure. She also pastors a church.     Review of systems: Review of Systems  Constitutional: Negative for fever and chills.  HENT: Negative.   Eyes: Negative for blurred vision.  Respiratory: as per HPI  Cardiovascular: Negative for chest pain and palpitations.  Gastrointestinal: Negative for vomiting, diarrhea, blood per rectum. Genitourinary: Negative for dysuria, urgency, frequency and hematuria.  Musculoskeletal: Negative for myalgias, back pain and joint pain.  Skin: Negative for itching and rash.  Neurological: Negative for dizziness, tremors, focal weakness, seizures and loss of consciousness.  Endo/Heme/Allergies: Negative for environmental allergies.  Psychiatric/Behavioral: Negative for depression, suicidal ideas and hallucinations.  All other systems reviewed and are negative.  Physical Exam: Blood pressure 118/68, pulse 72, temperature (!) 97 F (36.1 C), temperature source Temporal, height '5\' 2"'  (1.575 m), weight 205 lb 3.2 oz (93.1 kg), SpO2 99 %. Gen:      No acute distress HEENT:  EOMI, sclera anicteric Neck:     No masses; no thyromegaly Lungs:    Clear to auscultation bilaterally; normal respiratory effort CV:         Regular rate and rhythm; no murmurs Abd:      + bowel sounds; soft, non-tender; no palpable masses, no distension Ext:    No edema; adequate peripheral perfusion Skin:      Warm and dry; no rash Neuro: alert and oriented x 3 Psych: normal mood and affect  Data Reviewed: Imaging: CT chest 12/20/2017- right lower lobe nodular opacity has resolved compatible with atelectasis.  Apical pulmonary nodule decreased in size.   Emphysema. 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  No acute pulmonary disease. I have reviewed the images personally.  PFTs: 06/04/15: FVC 1.57 L (77%) FEV1 1.01 L (64%) FEV1/FVC 0.65 FEF 25-75 0.54 L (39%) negative bronchodilator response 02/21/15: FVC 1.60 L (78%) FEV1 1.03 L (65%) FEV1/FVC 0.64 FEF 25-75 0.51 L (37%) positive bronchodilator response 11/20/14: FVC 1.44 L (70%) FEV1 0.91 L (57%) FEV1/FVC 0.63 FEF 25-75 0.47 L (33%) positive bronchodilator response TLC 3.92 L (79%) RV 103% DLCO uncorrected 64% 11/12/13: FVC 1.57 L (74%) FEV1 1.09 L (67%) FEV1/FVC 0.70 FEF 25-75 0.72 L (50%) negative bronchodilator response 08/04/11: FVC 1.61 L (60%) FEV1 0.89 L (48%) FEV1/FVC 0.56 FEF 25-75 0.29 L (14%) positive bronchodilator response TLC 3.67 L (80%) RV 103% ERV 46% DLCO corrected 38%  Labs: 10/20/2017-WBC 6, eos 2.6%, absolute eosinophil count 156  Sleep Home sleep study 03/29/2017- AHI 1.8, desats to 78% PSG 10/07/2016- AHI 6.8.  Total sleep time 40.5 minutes.  Assessment:  Severe persistent asthma Continue Advair, Symbicort Albuterol, nebs as needed Continue Singulair, Allegra, Flonase  Nocturnal hypoxia Being evaluated for headaches which is thought to be secondary to sleep apnea Home sleep study ordered but previous one in 2019 did not demonstrate significant AHI We will change this to  in lab study as this is more sensitive in detecting sleep apnea  She has been noncompliant with nocturnal oxygen.  Encouraged to use it daily.  Plan/Recommendations: - Continue inhalers - Singulair, Allegra, Flonase - In lab sleep study.  Marshell Garfinkel MD [facility] 11/03/2018, 9:35 AM  CC: Binnie Rail, MD

## 2018-11-03 NOTE — Patient Instructions (Signed)
We we will cancel the home sleep study and schedule you for a in lab split-night sleep study Continue your inhalers as prescribed Make sure you use your supplemental oxygen every day at night  Follow-up in 3 months.

## 2018-11-06 ENCOUNTER — Ambulatory Visit: Payer: Medicare Other | Attending: Internal Medicine | Admitting: Physical Therapy

## 2018-11-06 ENCOUNTER — Other Ambulatory Visit: Payer: Self-pay

## 2018-11-06 DIAGNOSIS — M542 Cervicalgia: Secondary | ICD-10-CM | POA: Diagnosis present

## 2018-11-06 DIAGNOSIS — R42 Dizziness and giddiness: Secondary | ICD-10-CM

## 2018-11-06 DIAGNOSIS — R51 Headache: Secondary | ICD-10-CM | POA: Insufficient documentation

## 2018-11-06 DIAGNOSIS — R2689 Other abnormalities of gait and mobility: Secondary | ICD-10-CM | POA: Diagnosis present

## 2018-11-06 DIAGNOSIS — R2681 Unsteadiness on feet: Secondary | ICD-10-CM | POA: Diagnosis present

## 2018-11-06 DIAGNOSIS — G8929 Other chronic pain: Secondary | ICD-10-CM

## 2018-11-07 NOTE — Therapy (Signed)
[facility] [address] [place], [place], [address] Phone: [phone]   Fax:  [phone]  Physical Therapy Evaluation  Patient Details  Name: [name] MRN: [id number] Date of Birth: 07-Mar-1940 Referring Provider (PT): Binnie Rail, MD   Encounter Date: 11/06/2018  PT End of Session - 11/07/18 1042    Visit Number  1    Number of Visits  17    Date for PT Re-Evaluation  01/06/19    Authorization Type  [employer] Medicare - 10th visit PN    PT Start Time  1703    PT Stop Time  1750    PT Time Calculation (min)  47 min    Activity Tolerance  Patient tolerated treatment well    Behavior During Therapy  [facility] for tasks assessed/performed       Past Medical History:  Diagnosis Date  . Adenomatous colon polyp   . Allergy   . Anxiety   . Asthma   . Chest pain   . Chronic diastolic CHF (congestive heart failure) ([place])   . COPD (chronic obstructive pulmonary disease) ([place])    pt is unsure if has been officially diagnosed  . Coronary artery disease    CABG '09- cathed 12/09, 9/10, 6/11, 3/14 and 12/13/16- medical Rx  . Diabetes mellitus   . GERD (gastroesophageal reflux disease)   . Hiatal hernia   . Hyperlipidemia   . Hypertension   . Myocardial infarction ([place]) 2009  . PONV (postoperative nausea and vomiting)   . Schatzki's ring   . Shoulder injury    resolved after shoulder surgery  . Sleep apnea    not on cpap    Past Surgical History:  Procedure Laterality Date  . ABDOMINAL HYSTERECTOMY    . APPENDECTOMY     came out with Hysterectomy  . CARDIAC CATHETERIZATION  07/23/2009   EF 60%  . CARDIAC CATHETERIZATION  10/11/2008  . CARDIAC CATHETERIZATION  03/01/2007   EF 75-80%  . CARDIOVASCULAR STRESS TEST  11/15/2007   EF 60%  . COLONOSCOPY    . CORONARY ARTERY BYPASS GRAFT     SEVERELY DISEASED SAPHENOUS VEIN GRAFT TO THE RIGHT CORONARY ARTERY BUT WITH FAIRLY WELL PRESERVED FLOW TO THE DISTAL RIGHT  CORONARY ARTERY FROM THE NATIVE CIRCULATION-RESTART  CATH IN JUNE 2000, REVEALS MILD/MODERATE  CAD WITH GOOD FLOW DOWN HER LAD  . ESOPHAGOGASTRODUODENOSCOPY    . EYE SURGERY     bilateral cataract surgery with lens implant  . LEFT HEART CATH AND CORS/GRAFTS ANGIOGRAPHY N/A 12/14/2016   Procedure: LEFT HEART CATH AND CORS/GRAFTS ANGIOGRAPHY;  Surgeon: Jettie Booze, MD;  Location: [facility];  Service: Cardiovascular;  Laterality: N/A;  . lense removal Left   . POLYPECTOMY    . ROTATOR CUFF REPAIR     right and left  . TONSILLECTOMY     age 78  . 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 tumor removed kidney    . UPPER GASTROINTESTINAL ENDOSCOPY    . US ECHOCARDIOGRAPHY  03/08/2008   EF 55-60%    There were no vitals filed for this visit.   Subjective Assessment - 11/06/18 1709    Subjective  Pt was D/C from therapy in February, felt like she was in a "good place".  Then one day in March pt began to  have greater balance issues - staggering, catching herself on furniture.  Initially it was every other day but then it became more frequent.  Can be standing at the sink and become dizzy all of a sudden and have to hold on.  Is getting ready to have another sleep study.    Pertinent History  sleep apnea - currently not using CPAP and is due for another sleep study, chronic headaches, shoulder injury, MI, HTN, HLD, hiatal hernia, GERD, diabetes, CAD, COPD, asthma, CHF, anxiety, R TKA, and R and L rotator cuff repair    Patient Stated Goals  To continue to work on balance and prevent falls - "dangerous at my age"    Currently in Pain?  Yes    Pain Score  0-No pain    Pain Location  Head         [facility] PT Assessment - 11/06/18 1714      Assessment   Medical Diagnosis  Dizziness    Referring Provider (PT)  Binnie Rail, MD    Onset  Date/Surgical Date  09/29/18    Prior Therapy  yes - outpatient neuro - D/C in 03/2018      Precautions   Precautions  Other (comment)    Precaution Comments  sleep apnea - currently not using CPAP and is due for another sleep study, chronic headaches, shoulder injury, MI, HTN, HLD, hiatal hernia, GERD, diabetes, CAD, COPD, asthma, CHF, anxiety, R TKA, and R and L rotator cuff repair      Balance Screen   Has the patient fallen in the past 6 months  No    Is the patient reluctant to leave their home because of a fear of falling?   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  Dizziness is spontaneous and pt either has to sit down or hold on until it settles.  Some times when she is walking to her car and she stops, she gets the sensation that her car is moving    Type of Dizziness   Imbalance    Frequency of Dizziness  daily    Duration of Dizziness  5 minutes    Symptom Nature  Spontaneous    Aggravating Factors  Turning body quickly;Turning head quickly;Comment   looking to the right   Relieving Factors  Slow movements;Rest;Head stationary    Progression of Symptoms  --   was better but has become worse again   History of similar episodes  yes previously treated at oupatient neuro      Oculomotor  Exam   Oculomotor Alignment  Abnormal   R eye slight ptosis   Ocular ROM  WFL    Spontaneous  Absent    Gaze-induced   Absent    Smooth Pursuits  Intact   but reports diplopia with looking to R   Saccades  Intact    Comment  + test of skew; with R eye covered pt reports seeing double.  When finger held in upper, middle and lower R quadrant pt reported double vision      Oculomotor Exam-Fixation Suppressed    Left Head Impulse  negative    Right Head Impulse  pt reports having to let her eyes catch up to her head; double vision with head impulse to R      Vestibulo-Ocular Reflex   VOR to Slow Head Movement  Normal    VOR Cancellation  Comment    Comment  diplopia when head turned to R during VOR cancellation      Positional Testing   Sidelying Test  Sidelying Right;Sidelying Left      Sidelying Right   Sidelying Right Duration  0    Sidelying Right Symptoms  No nystagmus      Sidelying Left   Sidelying Left Duration  0    Sidelying Left Symptoms  No nystagmus      Positional Sensitivities   Supine to Left Side  Lightheadedness    Supine to Right Side  Mild dizziness    Nose to Right Knee  Lightheadedness    Right Knee to Sitting  Lightedness    Nose to Left Knee  No dizziness    Left Knee to Sitting  No dizziness    Head Turning x 5  Lightheadedness    Head Nodding x 5  Mild dizziness    Pivot Right in Standing  No dizziness    Pivot Left in Standing  No dizziness          Objective measurements completed on examination: See above findings.              PT Education - 11/07/18 1041    Education Details  clinical findings; PT POC and goals.  Importance to follow up with eye doctor and sleep study as these may be contributing to her disequilibrium    Person(s) Educated  Patient    Methods  Explanation    Comprehension  Verbalized understanding       PT Short Term Goals - 11/07/18 1055      PT SHORT TERM GOAL #1   Title  Pt will demonstrate ability  to perform initial HEP MOD I    Time  4    Period  Weeks    Status  New    Target Date  12/07/18      PT SHORT TERM GOAL #2   Title  Pt will participate in further assessment of gait/balance and falls risk with FGA and SOT assessments    Time  4    Period  Weeks    Status  New    Target Date  12/07/18      PT SHORT TERM GOAL #3   Title  Pt will report 25% reduction in episodes of dizziness on a weekly basis    Time  4    Period  Weeks    Status  New    Target Date  12/07/18      PT SHORT TERM GOAL #4   Title  Pt will report  decreased motion sensitivity to head turns, sit <> R sidelying and turning quickly in standing to 1/5    Baseline  2/5    Time  4    Period  Weeks    Status  New    Target Date  12/07/18        PT Long Term Goals - 11/07/18 1102      PT LONG TERM GOAL #1   Title  Pt will demonstrate ability to perform final HEP independently    Time  8    Period  Weeks    Status  New    Target Date  01/06/19      PT LONG TERM GOAL #2   Title  Pt will improve FGA by 4 points to indicate decreased falls risk    Baseline  TBD    Time  8    Period  Weeks    Status  New    Target Date  01/06/19      PT LONG TERM GOAL #3   Title  Pt will improve SOT by 8 points to [facility] for age group to indicate improved use of vestibular/visual/sensory system for balance    Baseline  TBD    Time  8    Period  Weeks    Status  New    Target Date  01/06/19      PT LONG TERM GOAL #4   Title  Pt will report decreased motion sensitivity to head turns, body turns and sit <> sidelying to 0/5    Time  8    Period  Weeks    Status  New    Target Date  01/06/19      PT LONG TERM GOAL #5   Title  Pt will demonstrate improvement in use of VOR as indicated by ability to perform VOR x 1 viewing in standing >2 minutes with mild onset of symptoms    Time  8    Period  Weeks    Status  New    Target Date  01/06/19      Additional Long Term Goals   Additional Long Term Goals  Yes       PT LONG TERM GOAL #6   Title  Pt will report 50% reduction in dizziness episodes on a weekly basis    Time  8    Period  Weeks    Status  New    Target Date  01/06/19             Plan - 11/07/18 1043    Clinical Impression Statement  Pt is an 78 year old female well known to this clinician, referred to Neuro OPPT for evaluation of reoccurrence of dizziness and imbalance that began one month after being discharged from therapy in 03/2018.  Pt's PMH is significant for the following: sleep apnea - currently not using CPAP and is due for another sleep study, chronic headaches, shoulder injury, MI, HTN, HLD, hiatal hernia, GERD, diabetes, CAD, COPD, asthma, CHF, anxiety, R TKA, and R and L rotator cuff repair. The following deficits were noted during pt's exam: impaired oculomotor exam, impaired vision with diplopia in R visual field but also reports diplopia with monocular vision, impaired peripheral vestibular function, impaired dynamic balance, motion sensitivity, impaired LE sensation due to neuropathy, chronic headache, disequilibrium and impaired gait.  Pt was negative for vertigo and nystagmus during sidelying test of peripheral canals. Pt would benefit from skilled PT to address these impairments  and functional limitations to maximize functional mobility independence and reduce falls risk.    Personal Factors and Comorbidities  Age;Comorbidity 3+;Past/Current Experience    Comorbidities  sleep apnea - currently not using CPAP and is due for another sleep study, chronic headaches, shoulder injury, MI, HTN, HLD, hiatal hernia, GERD, diabetes, CAD, COPD, asthma, CHF, anxiety, R TKA, and R and L rotator cuff repair    Examination-Activity Limitations  Locomotion Level;Stand    Examination-Participation Restrictions  Cleaning;Community Activity;Driving;Meal Prep    Stability/Clinical Decision Making  Evolving/Moderate complexity    Clinical Decision Making  Moderate    Rehab Potential  Good     PT Frequency  2x / week    PT Duration  8 weeks    PT Treatment/Interventions  ADLs/Self Care Home Management;Canalith Repostioning;Cryotherapy;Moist Heat;Gait training;Stair training;Functional mobility training;Therapeutic activities;Therapeutic exercise;Balance training;Neuromuscular re-education;Patient/family education;Manual techniques;Passive range of motion;Dry needling;Vestibular;Visual/perceptual remediation/compensation    PT Next Visit Plan  Assess SOT and FGA - update LTG.  review past HEP and update as needed.    Recommended Other Services  Find out results of sleep study and eye doctor appointment due to R visual field diplopia per pt report - sleep apnea and vision could be contributing to her episodes of spontaneous dizziness    Consulted and Agree with Plan of Care  Patient       Patient will benefit from skilled therapeutic intervention in order to improve the following deficits and impairments:  Decreased balance, Difficulty walking, Dizziness, Impaired sensation, Impaired vision/preception, Pain  Visit Diagnosis: Dizziness and giddiness  Unsteadiness on feet  Other abnormalities of gait and mobility  Cervicalgia  Chronic intractable headache, unspecified headache type     Problem List Patient Active Problem List   Diagnosis Date Noted  . Chest pain at rest 08/24/2018  . Arthralgia 08/01/2017  . COPD with asthma ([place]) 05/04/2017  . Angina pectoris ([place]) 01/05/2017  . Insomnia 11/23/2016  . Chronic nonintractable headache 11/23/2016  . Pancreatic insufficiency 10/28/2016  . Carotid artery disease ([place]) 05/19/2016  . Fatigue 05/19/2016  . Anemia 03/27/2016  . Acute bronchitis with COPD ([place]) 12/05/2015  . Type 1 diabetes mellitus ([place]) 07/21/2015  . B12 deficiency 03/31/2015  . OSA (obstructive sleep apnea) with hypoxia 11/20/2014  . Fecal incontinence 08/28/2014  . 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 Falls 08/28/2014  . Neck pain of over 3  months duration 08/28/2014  . H/O total knee replacement 02/20/2014  . Obesity (BMI 30-39.9) 11/12/2013  . Chronic diastolic CHF (congestive heart failure) ([place]) 11/17/2012  . Meniscus, lateral, anterior horn derangement 11/10/2011  . Medial meniscus, posterior horn derangement 11/10/2011  . Osteoarthritis of right knee 11/10/2011  . Vertigo 10/05/2011  . HTN (hypertension), benign 10/05/2011  . CAD (coronary artery disease) 08/27/2010  . MUSCLE CRAMPS 12/29/2009  . History of myocardial infarction 11/19/2009  . Extrinsic asthma 11/19/2009  . GERD 11/19/2009  . Cough 11/19/2009  . Hyperlipidemia 11/18/2009    Rico Junker, PT, DPT 11/07/18    11:10 AM    [facility] [address] [place], [place], [address] Phone: [phone]   Fax:  [phone]  Name: [name] MRN: [id number] Date of Birth: [birth year]

## 2018-11-08 ENCOUNTER — Other Ambulatory Visit: Payer: Self-pay

## 2018-11-08 ENCOUNTER — Encounter: Payer: Medicare Other | Admitting: *Deleted

## 2018-11-08 DIAGNOSIS — Z006 Encounter for examination for normal comparison and control in clinical research program: Secondary | ICD-10-CM

## 2018-11-08 NOTE — Research (Signed)
Subject to research clinic for visit 3 in the [place] research study.  No cos, aes or saes to report.  Injection given, subject tolerated well. Next clinic visit scheduled.

## 2018-11-09 ENCOUNTER — Other Ambulatory Visit: Payer: Self-pay | Admitting: Pulmonary Disease

## 2018-11-14 ENCOUNTER — Ambulatory Visit: Payer: Medicare Other | Admitting: Physical Therapy

## 2018-11-15 LAB — HM MAMMOGRAPHY

## 2018-11-17 ENCOUNTER — Other Ambulatory Visit (HOSPITAL_COMMUNITY)
Admission: RE | Admit: 2018-11-17 | Discharge: 2018-11-17 | Disposition: A | Discharge status: Home / Self Care | Payer: Medicare Other | Source: Ambulatory Visit | Attending: Pulmonary Disease | Admitting: Pulmonary Disease

## 2018-11-17 DIAGNOSIS — Z20828 Contact with and (suspected) exposure to other viral communicable diseases: Secondary | ICD-10-CM | POA: Diagnosis not present

## 2018-11-17 DIAGNOSIS — Z01812 Encounter for preprocedural laboratory examination: Secondary | ICD-10-CM | POA: Diagnosis present

## 2018-11-20 ENCOUNTER — Other Ambulatory Visit: Payer: Self-pay

## 2018-11-20 ENCOUNTER — Ambulatory Visit (HOSPITAL_BASED_OUTPATIENT_CLINIC_OR_DEPARTMENT_OTHER): Payer: Medicare Other | Attending: Pulmonary Disease | Admitting: Pulmonary Disease

## 2018-11-20 DIAGNOSIS — Z79899 Other long term (current) drug therapy: Secondary | ICD-10-CM | POA: Insufficient documentation

## 2018-11-20 DIAGNOSIS — G4733 Obstructive sleep apnea (adult) (pediatric): Secondary | ICD-10-CM | POA: Diagnosis present

## 2018-11-20 LAB — NOVEL CORONAVIRUS, NAA (HOSP ORDER, SEND-OUT TO REF LAB; TAT 18-24 HRS): SARS-CoV-2, NAA: NOT DETECTED

## 2018-11-22 ENCOUNTER — Other Ambulatory Visit: Payer: Self-pay | Admitting: Cardiovascular Disease

## 2018-11-22 ENCOUNTER — Encounter: Payer: Self-pay | Admitting: Internal Medicine

## 2018-11-23 ENCOUNTER — Other Ambulatory Visit: Payer: Self-pay

## 2018-11-23 ENCOUNTER — Ambulatory Visit: Payer: Medicare Other | Attending: Internal Medicine | Admitting: Rehabilitative and Restorative Service Providers"

## 2018-11-23 ENCOUNTER — Encounter: Payer: Self-pay | Admitting: Rehabilitative and Restorative Service Providers"

## 2018-11-23 DIAGNOSIS — R42 Dizziness and giddiness: Secondary | ICD-10-CM | POA: Diagnosis present

## 2018-11-23 DIAGNOSIS — G4733 Obstructive sleep apnea (adult) (pediatric): Secondary | ICD-10-CM | POA: Diagnosis not present

## 2018-11-23 DIAGNOSIS — R2681 Unsteadiness on feet: Secondary | ICD-10-CM

## 2018-11-23 DIAGNOSIS — R2689 Other abnormalities of gait and mobility: Secondary | ICD-10-CM

## 2018-11-23 NOTE — Procedures (Signed)
    Patient Name: [name], [name] Date: 11/20/2018 Gender: Female D.O.B: Dec 08, 1940 Age (years): 62 Referring Provider: Marshell Garfinkel Height (inches): 89 Interpreting Physician: Chesley Mires MD, ABSM Weight (lbs): 187 RPSGT: Carolin Coy BMI: 34 MRN: [id number] Neck Size: 14.50  CLINICAL INFORMATION Sleep Study Type: NPSG  Indication for sleep study: Diabetes, Hypertension, Snoring  Epworth Sleepiness Score: 0  Most recent polysomnogram dated 10/31/2014 revealed an AHI of 6.8/h. Most recent titration study dated 04/14/2015 was optimal at 9cm H2O with an AHI of 0.0/h.  SLEEP STUDY TECHNIQUE As per the AASM Manual for the Scoring of Sleep and Associated Events v2.3 (April 2016) with a hypopnea requiring 4% desaturations.  The channels recorded and monitored were frontal, central and occipital EEG, electrooculogram (EOG), submentalis EMG (chin), nasal and oral airflow, thoracic and abdominal wall motion, anterior tibialis EMG, snore microphone, electrocardiogram, and pulse oximetry.  MEDICATIONS Medications self-administered by patient taken the night of the study : BACLOFEN, CLONAZEPAM, ZETIA  SLEEP ARCHITECTURE The study was initiated at 10:42:09 PM and ended at 5:17:52 AM.  Sleep onset time was 58.2 minutes and the sleep efficiency was 73.9%%. The total sleep time was 292.5 minutes.  Stage REM latency was 118.5 minutes.  The patient spent 14.0%% of the night in stage N1 sleep, 74.0%% in stage N2 sleep, 0.0%% in stage N3 and 12% in REM.  Alpha intrusion was absent.  Supine sleep was 5.56%.  RESPIRATORY PARAMETERS The overall apnea/hypopnea index (AHI) was 13.3 per hour. There were 1 total apneas, including 1 obstructive, 0 central and 0 mixed apneas. There were 64 hypopneas and 10 RERAs.  The AHI during Stage REM sleep was 49.7 per hour.  AHI while supine was 7.4 per hour.  The mean oxygen saturation was 92.8%. The minimum SpO2 during sleep was 81.0%.   moderate snoring was noted during this study.  CARDIAC DATA The 2 lead EKG demonstrated sinus rhythm. The mean heart rate was 94.4 beats per minute. Other EKG findings include: PVCs.  LEG MOVEMENT DATA The total PLMS were 0 with a resulting PLMS index of 0.0. Associated arousal with leg movement index was 0.2 .  IMPRESSIONS - Mild obstructive sleep apnea with an AHI of 13.3 and SpO2 low of 81%.  DIAGNOSIS - Obstructive Sleep Apnea (327.23 [G47.33 ICD-10])  RECOMMENDATIONS - Additional therapies include weight loss, CPAP, oral appliance, or surgical assessment.  [Electronically signed] 11/23/2018 01:40 PM  Chesley Mires MD, [place], American Board of Sleep Medicine   NPI: [id number]

## 2018-11-23 NOTE — Progress Notes (Signed)
Patient Name: [name], [name] Date: 11/20/2018 Gender: Female D.O.B: 1940-04-15 Age (years): 69 Referring Provider: Marshell Garfinkel Height (inches): 80 Interpreting Physician: Chesley Mires MD, ABSM Weight (lbs): 187 RPSGT: Carolin Coy BMI: 34 MRN: [id number] Neck Size: 14.50  CLINICAL INFORMATION Sleep Study Type: NPSG  Indication for sleep study: Diabetes, Hypertension, Snoring  Epworth Sleepiness Score: 0  Most recent polysomnogram dated 10/31/2014 revealed an AHI of 6.8/h. Most recent titration study dated 04/14/2015 was optimal at 9cm H2O with an AHI of 0.0/h.  SLEEP STUDY TECHNIQUE As per the AASM Manual for the Scoring of Sleep and Associated Events v2.3 (April 2016) with a hypopnea requiring 4% desaturations.  The channels recorded and monitored were frontal, central and occipital EEG, electrooculogram (EOG), submentalis EMG (chin), nasal and oral airflow, thoracic and abdominal wall motion, anterior tibialis EMG, snore microphone, electrocardiogram, and pulse oximetry.  MEDICATIONS Medications self-administered by patient taken the night of the study : BACLOFEN, CLONAZEPAM, ZETIA  SLEEP ARCHITECTURE The study was initiated at 10:42:09 PM and ended at 5:17:52 AM.  Sleep onset time was 58.2 minutes and the sleep efficiency was 73.9%%. The total sleep time was 292.5 minutes.  Stage REM latency was 118.5 minutes.  The patient spent 14.0%% of the night in stage N1 sleep, 74.0%% in stage N2 sleep, 0.0%% in stage N3 and 12% in REM.  Alpha intrusion was absent.  Supine sleep was 5.56%.  RESPIRATORY PARAMETERS The overall apnea/hypopnea index (AHI) was 13.3 per hour. There were 1 total apneas, including 1 obstructive, 0 central and 0 mixed apneas. There were 64 hypopneas and 10 RERAs.  The AHI during Stage REM sleep was 49.7 per hour.  AHI while supine was 7.4 per hour.  The mean oxygen saturation was 92.8%. The minimum SpO2 during sleep was 81.0%.  moderate  snoring was noted during this study.  CARDIAC DATA The 2 lead EKG demonstrated sinus rhythm. The mean heart rate was 94.4 beats per minute. Other EKG findings include: PVCs.  LEG MOVEMENT DATA The total PLMS were 0 with a resulting PLMS index of 0.0. Associated arousal with leg movement index was 0.2 .  IMPRESSIONS - Mild obstructive sleep apnea with an AHI of 13.3 and SpO2 low of 81%.  DIAGNOSIS - Obstructive Sleep Apnea (327.23 [G47.33 ICD-10])  RECOMMENDATIONS - Additional therapies include weight loss, CPAP, oral appliance, or surgical assessment.  [Electronically signed] 11/23/2018 01:40 PM  Chesley Mires MD, [place], American Board of Sleep Medicine   NPI: [id number]

## 2018-11-23 NOTE — Patient Instructions (Signed)
Access Code: [email or web address]  URL: [email or web address]  Date: 11/23/2018  Prepared by: Rudell Cobb   Program Notes  WALKING: Begin walking 4- 5 minutes at a time 2 times per day. Walk 4-5 times/day.   Exercises Seated Gaze Stabilization with Head Rotation - 8-10 reps - 3 sets - 1x daily - 7x weekly Rolling right<>left sides for vestibular habituation - 5 reps - 1 sets                            - 2x daily - 7x weekly

## 2018-11-24 ENCOUNTER — Other Ambulatory Visit: Payer: Self-pay

## 2018-11-24 DIAGNOSIS — G4733 Obstructive sleep apnea (adult) (pediatric): Secondary | ICD-10-CM

## 2018-11-24 NOTE — Progress Notes (Signed)
I spoke with patient and made her aware of her results and that an order will be placed for her CPAP. She verbalized understanding. Nothing further is needed.

## 2018-11-24 NOTE — Progress Notes (Signed)
LMTCB x 1 

## 2018-11-24 NOTE — Therapy (Signed)
[facility] [address] [place], [place], [address] Phone: [phone]   Fax:  [phone]  Physical Therapy Treatment  Patient Details  Name: [name] MRN: [id number] Date of Birth: 78 Aug 29, 1940 Referring Provider (PT): Binnie Rail, MD   Encounter Date: 11/23/2018  PT End of Session - 11/23/18 [id number]    Visit Number  2    Number of Visits  17    Date for PT Re-Evaluation  01/06/19    Authorization Type  [employer] Medicare - 10th visit PN    PT Start Time  1705    PT Stop Time  1745    PT Time Calculation (min)  40 min    Activity Tolerance  Patient tolerated treatment well    Behavior During Therapy  [facility] for tasks assessed/performed       Past Medical History:  Diagnosis Date  . Adenomatous colon polyp   . Allergy   . Anxiety   . Asthma   . Chest pain   . Chronic diastolic CHF (congestive heart failure) ([place])   . COPD (chronic obstructive pulmonary disease) ([place])    pt is unsure if has been officially diagnosed  . Coronary artery disease    CABG '09- cathed 12/09, 9/10, 6/11, 3/14 and 12/13/16- medical Rx  . Diabetes mellitus   . GERD (gastroesophageal reflux disease)   . Hiatal hernia   . Hyperlipidemia   . Hypertension   . Myocardial infarction ([place]) 2009  . PONV (postoperative nausea and vomiting)   . Schatzki's ring   . Shoulder injury    resolved after shoulder surgery  . Sleep apnea    not on cpap    Past Surgical History:  Procedure Laterality Date  . ABDOMINAL HYSTERECTOMY    . APPENDECTOMY     came out with Hysterectomy  . CARDIAC CATHETERIZATION  07/23/2009   EF 60%  . CARDIAC CATHETERIZATION  10/11/2008  . CARDIAC CATHETERIZATION  03/01/2007   EF 75-80%  . CARDIOVASCULAR STRESS TEST  11/15/2007   EF 60%  . COLONOSCOPY    . CORONARY ARTERY BYPASS GRAFT     SEVERELY DISEASED SAPHENOUS VEIN GRAFT TO THE RIGHT CORONARY ARTERY BUT WITH FAIRLY WELL PRESERVED FLOW TO THE DISTAL RIGHT  CORONARY ARTERY FROM THE NATIVE CIRCULATION-RESTART  CATH IN JUNE 2000, REVEALS MILD/MODERATE  CAD WITH GOOD FLOW DOWN HER LAD  . ESOPHAGOGASTRODUODENOSCOPY    . EYE SURGERY     bilateral cataract surgery with lens implant  . LEFT HEART CATH AND CORS/GRAFTS ANGIOGRAPHY N/A 12/14/2016   Procedure: LEFT HEART CATH AND CORS/GRAFTS ANGIOGRAPHY;  Surgeon: Jettie Booze, MD;  Location: [facility];  Service: Cardiovascular;  Laterality: N/A;  . lense removal Left   . POLYPECTOMY    . ROTATOR CUFF REPAIR     right and left  . TONSILLECTOMY     age 78  . 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 tumor removed kidney    . UPPER GASTROINTESTINAL ENDOSCOPY    . US ECHOCARDIOGRAPHY  03/08/2008   EF 55-60%    There were no vitals filed for this visit.  Subjective Assessment - 11/23/18 1709    Subjective  The patient reports on Tuesday 10/6, she woke up and was dizzy and it lasted all day.  She says this was unusual b/c it  usually clears by  middle of the day.  She reports she has to move slow to avoid dizziness with all activities.    Pertinent History  sleep apnea - currently not using CPAP and is due for another sleep study, chronic headaches, shoulder injury, MI, HTN, HLD, hiatal hernia, GERD, diabetes, CAD, COPD, asthma, CHF, anxiety, R TKA, and R and L rotator cuff repair    Patient Stated Goals  To continue to work on balance and prevent falls - "dangerous at my 78"    Currently in Pain?  No/denies         [facility] PT Assessment - 11/23/18 1732      Functional Gait  Assessment   Gait assessed   Yes    Gait Level Surface  Walks 20 ft in less than 7 sec but greater than 5.5 sec, uses assistive device, slower speed, mild gait deviations, or deviates 6-10 in outside of the 12 in walkway width.    Change in Gait Speed  Able to smoothly change  walking speed without loss of balance or gait deviation. Deviate no more than 6 in outside of the 12 in walkway width.    Gait with Horizontal Head Turns  Performs head turns smoothly with slight change in gait velocity (eg, minor disruption to smooth gait path), deviates 6-10 in outside 12 in walkway width, or uses an assistive device.    Gait with Vertical Head Turns  Performs task with moderate change in gait velocity, slows down, deviates 10-15 in outside 12 in walkway width but recovers, can continue to walk.    Gait and Pivot Turn  Pivot turns safely within 3 sec and stops quickly with no loss of balance.    Step Over Obstacle  Is able to step over one shoe box (4.5 in total height) without changing gait speed. No evidence of imbalance.    Gait with Narrow Base of Support  Ambulates less than 4 steps heel to toe or cannot perform without assistance.    Gait with Eyes Closed  Walks 20 ft, slow speed, abnormal gait pattern, evidence for imbalance, deviates 10-15 in outside 12 in walkway width. Requires more than 9 sec to ambulate 20 ft.    Ambulating Backwards  Walks 20 ft, uses assistive device, slower speed, mild gait deviations, deviates 6-10 in outside 12 in walkway width.    Steps  Alternating feet, must use rail.    Total Score  18    FGA comment:  18/30         Vestibular Assessment - 11/23/18 1711      Vestibular Assessment   General Observation  The patient had a bad day 10/6 noting "I don't ever know when I'm going to be attacked."  "I am appauled it is back so soon."      Positional Testing   Sidelying Test  Sidelying Left;Sidelying Right      Sidelying Right   Sidelying Right Duration  dizziness with return to sitting    Sidelying Right Symptoms  No nystagmus      Sidelying Left   Sidelying Left Duration  dizziness with return to sitting    Sidelying Left Symptoms  No nystagmus               [facility] Adult PT Treatment/Exercise - 11/23/18 1732       Ambulation/Gait   Ambulation/Gait  Yes    Ambulation/Gait Assistance  6: Modified independent (Device/Increase time)   slowed pace  Ambulation Distance (Feet)  345 Feet    Assistive device  None    Ambulation Surface  Level;Indoor    Gait Comments  Encouraged regular walking throughout the day to increase movement for habituation.      Vestibular Treatment/Exercise - 11/23/18 1719      Vestibular Treatment/Exercise   Vestibular Treatment Provided  Gaze;Habituation    Habituation Exercises  Horizontal Roll    Gaze Exercises  X1 Viewing Horizontal      Horizontal Roll   Number of Reps   3    Symptom Description   Dizziness initially worse to the right side, no nystagmus viewed in room light, however dec'd intensity with repetition.      X1 Viewing Horizontal   Foot Position  seated to focus on eye/head movement    Comments  x 5 reps x 3 sets with cues on increasing speed and range of motion.; then performed again after ambulation with improved ease of movement and less dizziness            PT Education - 11/24/18 0933    Education Details  initiated HEP    Person(s) Educated  Patient    Methods  Explanation;Demonstration;Handout    Comprehension  Verbalized understanding;Returned demonstration       PT Short Term Goals - 11/23/18 1745      PT SHORT TERM GOAL #1   Title  Pt will demonstrate ability to perform initial HEP MOD I    Time  4    Period  Weeks    Status  New    Target Date  12/07/18      PT SHORT TERM GOAL #2   Title  Pt will participate in further assessment of gait/balance and falls risk with FGA and SOT assessments    Baseline  FGA=18/30    Time  4    Period  Weeks    Status  On-going    Target Date  12/07/18      PT SHORT TERM GOAL #3   Title  Pt will report 25% reduction in episodes of dizziness on a weekly basis    Time  4    Period  Weeks    Status  New    Target Date  12/07/18      PT SHORT TERM GOAL #4   Title  Pt will report  decreased motion sensitivity to head turns, sit <> R sidelying and turning quickly in standing to 1/5    Baseline  2/5    Time  4    Period  Weeks    Status  New    Target Date  12/07/18        PT Long Term Goals - 11/24/18 0934      PT LONG TERM GOAL #1   Title  Pt will demonstrate ability to perform final HEP independently    Time  8    Period  Weeks    Status  New      PT LONG TERM GOAL #2   Title  Pt will improve FGA by 4 points to indicate decreased falls risk    Baseline  18/30    Time  8    Period  Weeks    Status  New      PT LONG TERM GOAL #3   Title  Pt will improve SOT by 8 points to [facility] for age group to indicate improved use of vestibular/visual/sensory system for balance    Baseline  TBD    Time  8    Period  Weeks    Status  New      PT LONG TERM GOAL #4   Title  Pt will report decreased motion sensitivity to head turns, body turns and sit <> sidelying to 0/5    Time  8    Period  Weeks    Status  New      PT LONG TERM GOAL #5   Title  Pt will demonstrate improvement in use of VOR as indicated by ability to perform VOR x 1 viewing in standing >2 minutes with mild onset of symptoms    Time  8    Period  Weeks    Status  New      PT LONG TERM GOAL #6   Title  Pt will report 50% reduction in dizziness episodes on a weekly basis    Time  8    Period  Weeks    Status  New            Plan - 11/24/18 0935    Clinical Impression Statement  Today's session emphasized education to the patient that movement would be beneficial to help her work through dizziness.  We focused on establishing habituation HEP, gaze, and discussing regular walking.  Plan to progress working to STGs/LTGs.  Updated FGA by establishing baseline today.    PT Treatment/Interventions  ADLs/Self Care Home Management;Canalith Repostioning;Cryotherapy;Moist Heat;Gait training;Stair training;Functional mobility training;Therapeutic activities;Therapeutic exercise;Balance  training;Neuromuscular re-education;Patient/family education;Manual techniques;Passive range of motion;Dry needling;Vestibular;Visual/perceptual remediation/compensation    PT Next Visit Plan  Assess SOT when indicated.  Review HEP, progress to standing gaze as able, progress habituation, increase walking.    Consulted and Agree with Plan of Care  Patient       Patient will benefit from skilled therapeutic intervention in order to improve the following deficits and impairments:  Decreased balance, Difficulty walking, Dizziness, Impaired sensation, Impaired vision/preception, Pain  Visit Diagnosis: Dizziness and giddiness  Unsteadiness on feet  Other abnormalities of gait and mobility     Problem List Patient Active Problem List   Diagnosis Date Noted  . Chest pain at rest 08/24/2018  . Arthralgia 08/01/2017  . COPD with asthma ([place]) 05/04/2017  . Angina pectoris ([place]) 01/05/2017  . Insomnia 11/23/2016  . Chronic nonintractable headache 11/23/2016  . Pancreatic insufficiency 10/28/2016  . Carotid artery disease ([place]) 05/19/2016  . Fatigue 05/19/2016  . Anemia 03/27/2016  . Acute bronchitis with COPD ([place]) 12/05/2015  . Type 1 diabetes mellitus ([place]) 07/21/2015  . B12 deficiency 03/31/2015  . OSA (obstructive sleep apnea) with hypoxia 11/20/2014  . Fecal incontinence 08/28/2014  . 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 Falls 08/28/2014  . Neck pain of over 3 months duration 08/28/2014  . H/O total knee replacement 02/20/2014  . Obesity (BMI 30-39.9) 11/12/2013  . Chronic diastolic CHF (congestive heart failure) ([place]) 11/17/2012  . Meniscus, lateral, anterior horn derangement 11/10/2011  . Medial meniscus, posterior horn derangement 11/10/2011  . Osteoarthritis of right knee 11/10/2011  . Vertigo 10/05/2011  . HTN (hypertension), benign 10/05/2011  . CAD (coronary artery disease) 08/27/2010  . MUSCLE CRAMPS 12/29/2009  . History of myocardial infarction 11/19/2009  .  Extrinsic asthma 11/19/2009  . GERD 11/19/2009  . Cough 11/19/2009  . Hyperlipidemia 11/18/2009    Massiah Minjares, PT 11/24/2018, 9:37 AM  [facility] [address] [place], [place], [address] Phone: [phone]   Fax:  [phone]  Name: [name] MRN: [id number] Date of Birth: [birth year]

## 2018-11-27 ENCOUNTER — Other Ambulatory Visit: Payer: Self-pay | Admitting: Internal Medicine

## 2018-11-28 ENCOUNTER — Other Ambulatory Visit: Payer: Self-pay

## 2018-11-28 ENCOUNTER — Ambulatory Visit: Payer: Medicare Other | Admitting: Physical Therapy

## 2018-11-28 DIAGNOSIS — R42 Dizziness and giddiness: Secondary | ICD-10-CM | POA: Diagnosis not present

## 2018-11-28 DIAGNOSIS — R2689 Other abnormalities of gait and mobility: Secondary | ICD-10-CM

## 2018-11-28 DIAGNOSIS — R2681 Unsteadiness on feet: Secondary | ICD-10-CM

## 2018-11-29 ENCOUNTER — Encounter: Payer: Self-pay | Admitting: Physical Therapy

## 2018-11-29 NOTE — Therapy (Signed)
[facility] [address] [place], [place], [address] Phone: [phone]   Fax:  [phone]  Physical Therapy Treatment  Patient Details  Name: [name] MRN: [id number] Date of Birth: 1940/03/19 Referring Provider (PT): Binnie Rail, MD   Encounter Date: 11/28/2018  PT End of Session - 11/29/18 2052    Visit Number  3    Number of Visits  17    Date for PT Re-Evaluation  01/06/19    Authorization Type  [employer] Medicare - 10th visit PN    PT Start Time  1450    PT Stop Time  1535    PT Time Calculation (min)  45 min    Activity Tolerance  Patient tolerated treatment well    Behavior During Therapy  [facility] for tasks assessed/performed       Past Medical History:  Diagnosis Date  . Adenomatous colon polyp   . Allergy   . Anxiety   . Asthma   . Chest pain   . Chronic diastolic CHF (congestive heart failure) ([address])   . COPD (chronic obstructive pulmonary disease) ([place])    pt is unsure if has been officially diagnosed  . Coronary artery disease    CABG '09- cathed 12/09, 9/10, 6/11, 3/14 and 12/13/16- medical Rx  . Diabetes mellitus   . GERD (gastroesophageal reflux disease)   . Hiatal hernia   . Hyperlipidemia   . Hypertension   . Myocardial infarction ([place]) 2009  . PONV (postoperative nausea and vomiting)   . Schatzki's ring   . Shoulder injury    resolved after shoulder surgery  . Sleep apnea    not on cpap    Past Surgical History:  Procedure Laterality Date  . ABDOMINAL HYSTERECTOMY    . APPENDECTOMY     came out with Hysterectomy  . CARDIAC CATHETERIZATION  07/23/2009   EF 60%  . CARDIAC CATHETERIZATION  10/11/2008  . CARDIAC CATHETERIZATION  03/01/2007   EF 75-80%  . CARDIOVASCULAR STRESS TEST  11/15/2007   EF 60%  . COLONOSCOPY    . CORONARY ARTERY BYPASS GRAFT     SEVERELY DISEASED SAPHENOUS VEIN GRAFT TO THE RIGHT CORONARY ARTERY BUT WITH FAIRLY WELL PRESERVED FLOW TO THE DISTAL RIGHT  CORONARY ARTERY FROM THE NATIVE CIRCULATION-RESTART  CATH IN JUNE 2000, REVEALS MILD/MODERATE  CAD WITH GOOD FLOW DOWN HER LAD  . ESOPHAGOGASTRODUODENOSCOPY    . EYE SURGERY     bilateral cataract surgery with lens implant  . LEFT HEART CATH AND CORS/GRAFTS ANGIOGRAPHY N/A 12/14/2016   Procedure: LEFT HEART CATH AND CORS/GRAFTS ANGIOGRAPHY;  Surgeon: Jettie Booze, MD;  Location: [facility];  Service: Cardiovascular;  Laterality: N/A;  . lense removal Left   . POLYPECTOMY    . ROTATOR CUFF REPAIR     right and left  . TONSILLECTOMY     age 44  . 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 tumor removed kidney    . UPPER GASTROINTESTINAL ENDOSCOPY    . US ECHOCARDIOGRAPHY  03/08/2008   EF 55-60%    There were no vitals filed for this visit.  Subjective Assessment - 11/28/18 1453    Subjective  Pt reports she has been doing the exercises at home - has trouble with the letter exercise - states she has to let "my eyes  catch up with my head"    Pertinent History  sleep apnea - currently not using CPAP and is due for another sleep study, chronic headaches, shoulder injury, MI, HTN, HLD, hiatal hernia, GERD, diabetes, CAD, COPD, asthma, CHF, anxiety, R TKA, and R and L rotator cuff repair    Patient Stated Goals  To continue to work on balance and prevent falls - "dangerous at my age"    Currently in Pain?  No/denies                       [facility] Adult PT Treatment/Exercise - 11/29/18 0001      Transfers   Transfers  Sit to Stand    Number of Reps  Other reps (comment)   5   Comments  feet on blue airex      Vestibular Treatment/Exercise - 11/29/18 0001      Vestibular Treatment/Exercise   Vestibular Treatment Provided  Gaze    Habituation Exercises  Nestor Lewandowsky    Gaze Exercises  X1 Viewing Horizontal;X1 Viewing  Vertical      Nestor Lewandowsky   Number of Reps   1    Symptom Description   no dizziness reported      X1 Viewing Horizontal   Foot Position  standing - 5' away from target on plain background    Time  --   1"   Reps  1      X1 Viewing Vertical   Foot Position  standing- 5' away from target on plain background     Time  --   60 secs    Reps  1         Balance Exercises - 11/29/18 2045      Balance Exercises: Standing   Standing Eyes Opened  Wide (BOA);Head turns;Foam/compliant surface;5 reps    Rockerboard  Anterior/posterior;10 reps;UE support    Step Ups  Forward;6 inch;UE support 1          PT Short Term Goals - 11/29/18 2056      PT SHORT TERM GOAL #1   Title  Pt will demonstrate ability to perform initial HEP MOD I    Time  4    Period  Weeks    Status  New    Target Date  12/07/18      PT SHORT TERM GOAL #2   Title  Pt will participate in further assessment of gait/balance and falls risk with FGA and SOT assessments    Baseline  FGA=18/30    Time  4    Period  Weeks    Status  On-going    Target Date  12/07/18      PT SHORT TERM GOAL #3   Title  Pt will report 25% reduction in episodes of dizziness on a weekly basis    Time  4    Period  Weeks    Status  New    Target Date  12/07/18      PT SHORT TERM GOAL #4   Title  Pt will report decreased motion sensitivity to head turns, sit <> R sidelying and turning quickly in standing to 1/5    Baseline  2/5    Time  4    Period  Weeks    Status  New    Target Date  12/07/18        PT Long Term Goals - 11/29/18 2056      PT LONG TERM  GOAL #1   Title  Pt will demonstrate ability to perform final HEP independently    Time  8    Period  Weeks    Status  New      PT LONG TERM GOAL #2   Title  Pt will improve FGA by 4 points to indicate decreased falls risk    Baseline  18/30    Time  8    Period  Weeks    Status  New      PT LONG TERM GOAL #3   Title  Pt will improve SOT by 8 points to [facility]  for age group to indicate improved use of vestibular/visual/sensory system for balance    Baseline  TBD    Time  8    Period  Weeks    Status  New      PT LONG TERM GOAL #4   Title  Pt will report decreased motion sensitivity to head turns, body turns and sit <> sidelying to 0/5    Time  8    Period  Weeks    Status  New      PT LONG TERM GOAL #5   Title  Pt will demonstrate improvement in use of VOR as indicated by ability to perform VOR x 1 viewing in standing >2 minutes with mild onset of symptoms    Time  8    Period  Weeks    Status  New      PT LONG TERM GOAL #6   Title  Pt will report 50% reduction in dizziness episodes on a weekly basis    Time  8    Period  Weeks    Status  New            Plan - 11/29/18 2052    Clinical Impression Statement  Pt reported most dizziness with gaze stabilization exercises and exercises requiring head turns; pt reported that she needed to allow time for her eyes to catch up with her head.  No signs or symptoms of BPPV noted with any positional testing today.    PT Next Visit Plan  balance and gait training with head turns       Patient will benefit from skilled therapeutic intervention in order to improve the following deficits and impairments:     Visit Diagnosis: Dizziness and giddiness  Unsteadiness on feet  Other abnormalities of gait and mobility     Problem List Patient Active Problem List   Diagnosis Date Noted  . Chest pain at rest 08/24/2018  . Arthralgia 08/01/2017  . COPD with asthma ([place]) 05/04/2017  . Angina pectoris ([place]) 01/05/2017  . Insomnia 11/23/2016  . Chronic nonintractable headache 11/23/2016  . Pancreatic insufficiency 10/28/2016  . Carotid artery disease ([place]) 05/19/2016  . Fatigue 05/19/2016  . Anemia 03/27/2016  . Acute bronchitis with COPD ([place]) 12/05/2015  . Type 1 diabetes mellitus ([place]) 07/21/2015  . B12 deficiency 03/31/2015  . OSA (obstructive sleep apnea) with hypoxia 11/20/2014   . Fecal incontinence 08/28/2014  . 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 Falls 08/28/2014  . Neck pain of over 3 months duration 08/28/2014  . H/O total knee replacement 02/20/2014  . Obesity (BMI 30-39.9) 11/12/2013  . Chronic diastolic CHF (congestive heart failure) ([place]) 11/17/2012  . Meniscus, lateral, anterior horn derangement 11/10/2011  . Medial meniscus, posterior horn derangement 11/10/2011  . Osteoarthritis of right knee 11/10/2011  . Vertigo 10/05/2011  . HTN (hypertension), benign 10/05/2011  .  CAD (coronary artery disease) 08/27/2010  . MUSCLE CRAMPS 12/29/2009  . History of myocardial infarction 11/19/2009  . Extrinsic asthma 11/19/2009  . GERD 11/19/2009  . Cough 11/19/2009  . Hyperlipidemia 11/18/2009    DildayJenness Corner, PT 11/29/2018, 8:57 PM  [facility] [address] [place], [place], [address] Phone: [phone]   Fax:  [phone]  Name: GENOVEVA PU MRN: [id number] Date of Birth: 05-01-1940

## 2018-12-05 ENCOUNTER — Encounter: Payer: Self-pay | Admitting: Physical Therapy

## 2018-12-05 ENCOUNTER — Ambulatory Visit: Payer: Medicare Other | Admitting: Physical Therapy

## 2018-12-05 ENCOUNTER — Other Ambulatory Visit: Payer: Self-pay

## 2018-12-05 DIAGNOSIS — R2689 Other abnormalities of gait and mobility: Secondary | ICD-10-CM

## 2018-12-05 DIAGNOSIS — R2681 Unsteadiness on feet: Secondary | ICD-10-CM

## 2018-12-05 DIAGNOSIS — R42 Dizziness and giddiness: Secondary | ICD-10-CM | POA: Diagnosis not present

## 2018-12-06 NOTE — Therapy (Signed)
[facility] [address] [place], [place], [address] Phone: [phone]   Fax:  [phone]  Physical Therapy Treatment  Patient Details  Name: [name] MRN: [id number] Date of Birth: [birth year] Referring Provider (PT): Binnie Rail, MD   Encounter Date: 12/05/2018  PT End of Session - 12/06/18 1152    Visit Number  4    Number of Visits  17    Date for PT Re-Evaluation  01/06/19    Authorization Type  [employer] Medicare - 10th visit PN    PT Start Time  1703    PT Stop Time  1750    PT Time Calculation (min)  47 min    Activity Tolerance  Patient tolerated treatment well    Behavior During Therapy  [facility] for tasks assessed/performed       Past Medical History:  Diagnosis Date  . Adenomatous colon polyp   . Allergy   . Anxiety   . Asthma   . Chest pain   . Chronic diastolic CHF (congestive heart failure) ([place])   . COPD (chronic obstructive pulmonary disease) ([place])    pt is unsure if has been officially diagnosed  . Coronary artery disease    CABG '09- cathed 12/09, 9/10, 6/11, 3/14 and 12/13/16- medical Rx  . Diabetes mellitus   . GERD (gastroesophageal reflux disease)   . Hiatal hernia   . Hyperlipidemia   . Hypertension   . Myocardial infarction ([place]) 2009  . PONV (postoperative nausea and vomiting)   . Schatzki's ring   . Shoulder injury    resolved after shoulder surgery  . Sleep apnea    not on cpap    Past Surgical History:  Procedure Laterality Date  . ABDOMINAL HYSTERECTOMY    . APPENDECTOMY     came out with Hysterectomy  . CARDIAC CATHETERIZATION  07/23/2009   EF 60%  . CARDIAC CATHETERIZATION  10/11/2008  . CARDIAC CATHETERIZATION  03/01/2007   EF 75-80%  . CARDIOVASCULAR STRESS TEST  11/15/2007   EF 60%  . COLONOSCOPY    . CORONARY ARTERY BYPASS GRAFT     SEVERELY DISEASED SAPHENOUS VEIN GRAFT TO THE RIGHT CORONARY ARTERY BUT WITH FAIRLY WELL PRESERVED FLOW TO THE DISTAL RIGHT  CORONARY ARTERY FROM THE NATIVE CIRCULATION-RESTART  CATH IN JUNE 2000, REVEALS MILD/MODERATE  CAD WITH GOOD FLOW DOWN HER LAD  . ESOPHAGOGASTRODUODENOSCOPY    . EYE SURGERY     bilateral cataract surgery with lens implant  . LEFT HEART CATH AND CORS/GRAFTS ANGIOGRAPHY N/A 12/14/2016   Procedure: LEFT HEART CATH AND CORS/GRAFTS ANGIOGRAPHY;  Surgeon: Jettie Booze, MD;  Location: [facility];  Service: Cardiovascular;  Laterality: N/A;  . lense removal Left   . POLYPECTOMY    . ROTATOR CUFF REPAIR     right and left  . TONSILLECTOMY     age 78  . 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 tumor removed kidney    . UPPER GASTROINTESTINAL ENDOSCOPY    . US ECHOCARDIOGRAPHY  03/08/2008   EF 55-60%    There were no vitals filed for this visit.  Subjective Assessment - 12/05/18 1706    Subjective  Pt states she was really dizzy yesterday - states maybe she was too tired - feels better today    Pertinent History  sleep  apnea - currently not using CPAP and is due for another sleep study, chronic headaches, shoulder injury, MI, HTN, HLD, hiatal hernia, GERD, diabetes, CAD, COPD, asthma, CHF, anxiety, R TKA, and R and L rotator cuff repair    Patient Stated Goals  To continue to work on balance and prevent falls - "dangerous at my age"    Currently in Pain?  No/denies                       [facility] Adult PT Treatment/Exercise - 12/06/18 0001      Ambulation/Gait   Ambulation/Gait  Yes    Ambulation/Gait Assistance  5: Supervision    Ambulation/Gait Assistance Details  125    Assistive device  None    Ambulation Surface  Level;Indoor    Gait Comments  dynamic gait activities for improved balance - walking with abrupt stop and turn x 4 reps - 20' x 4;  amb. with horizontal head turns approx. 40' x 1 rep          Balance  Exercises - 12/06/18 1151      Balance Exercises: Standing   Standing Eyes Closed  Wide (BOA);Head turns;Solid surface;2 reps    SLS  Eyes open;2 reps;10 secs    Rockerboard  Anterior/posterior;10 reps;UE support    Step Over Hurdles / Cones  stepping over orange hurdle 10 reps each leg for improved SLS with UE support on counter prn    Other Standing Exercises  pt performed marching in place with EO/EC; added head turns with EO and EC with UE support on counter prn           PT Short Term Goals - 12/05/18 1737      PT SHORT TERM GOAL #1   Title  Pt will demonstrate ability to perform initial HEP MOD I    Baseline  met 12-05-18    Time  4    Period  Weeks    Status  Achieved    Target Date  12/07/18      PT SHORT TERM GOAL #2   Title  Pt will participate in further assessment of gait/balance and falls risk with FGA and SOT assessments    Baseline  FGA=18/30    Time  4    Period  Weeks    Status  On-going    Target Date  12/07/18      PT SHORT TERM GOAL #3   Title  Pt will report 25% reduction in episodes of dizziness on a weekly basis    Baseline  pt reports dizziness continues to vary in intensity - reports overall no reduction    Time  4    Period  Weeks    Status  New    Target Date  12/07/18      PT SHORT TERM GOAL #4   Title  Pt will report decreased motion sensitivity to head turns, sit <> R sidelying and turning quickly in standing to 1/5    Baseline  pt continues to c/o dizziness with head turns    Time  4    Period  Weeks    Status  New    Target Date  12/07/18        PT Long Term Goals - 12/06/18 1155      PT LONG TERM GOAL #1   Title  Pt will demonstrate ability to perform final HEP independently    Time  8  Period  Weeks    Status  New      PT LONG TERM GOAL #2   Title  Pt will improve FGA by 4 points to indicate decreased falls risk    Baseline  18/30    Time  8    Period  Weeks    Status  New      PT LONG TERM GOAL #3   Title  Pt will  improve SOT by 8 points to [facility] for age group to indicate improved use of vestibular/visual/sensory system for balance    Baseline  TBD    Time  8    Period  Weeks    Status  New      PT LONG TERM GOAL #4   Title  Pt will report decreased motion sensitivity to head turns, body turns and sit <> sidelying to 0/5    Time  8    Period  Weeks    Status  New      PT LONG TERM GOAL #5   Title  Pt will demonstrate improvement in use of VOR as indicated by ability to perform VOR x 1 viewing in standing >2 minutes with mild onset of symptoms    Time  8    Period  Weeks    Status  New      PT LONG TERM GOAL #6   Title  Pt will report 50% reduction in dizziness episodes on a weekly basis    Time  8    Period  Weeks    Status  New            Plan - 12/05/18 1707    Clinical Impression Statement  Pt reports nervousness and anxiety with standing balance activities with head turns; 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  Pt continues to deny room spinning vertigo.    PT Frequency  2x / week    PT Duration  8 weeks    PT Treatment/Interventions  ADLs/Self Care Home Management;Canalith Repostioning;Cryotherapy;Moist Heat;Gait training;Stair training;Functional mobility training;Therapeutic activities;Therapeutic exercise;Balance training;Neuromuscular re-education;Patient/family education;Manual techniques;Passive range of motion;Dry needling;Vestibular;Visual/perceptual remediation/compensation    PT Next Visit Plan  balance and gait training with head turns       Patient will benefit from skilled therapeutic intervention in order to improve the following deficits and impairments:     Visit Diagnosis: Unsteadiness on feet  Other abnormalities of gait and mobility     Problem List Patient Active Problem List   Diagnosis Date Noted  . Chest pain at rest 08/24/2018  . Arthralgia 08/01/2017  . COPD with asthma  ([place]) 05/04/2017  . Angina pectoris ([place]) 01/05/2017  . Insomnia 11/23/2016  . Chronic nonintractable headache 11/23/2016  . Pancreatic insufficiency 10/28/2016  . Carotid artery disease ([place]) 05/19/2016  . Fatigue 05/19/2016  . Anemia 03/27/2016  . Acute bronchitis with COPD ([place]) 12/05/2015  . Type 1 diabetes mellitus ([place]) 07/21/2015  . B12 deficiency 03/31/2015  . OSA (obstructive sleep apnea) with hypoxia 11/20/2014  . Fecal incontinence 08/28/2014  . 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 Falls 08/28/2014  . Neck pain of over 3 months duration 08/28/2014  . H/O total knee replacement 02/20/2014  . Obesity (BMI 30-39.9) 11/12/2013  . Chronic diastolic CHF (congestive heart failure) ([place]) 11/17/2012  . Meniscus, lateral, anterior horn derangement 11/10/2011  . Medial meniscus, posterior horn derangement 11/10/2011  . Osteoarthritis of right knee 11/10/2011  . Vertigo 10/05/2011  .  HTN (hypertension), benign 10/05/2011  . CAD (coronary artery disease) 08/27/2010  . MUSCLE CRAMPS 12/29/2009  . History of myocardial infarction 11/19/2009  . Extrinsic asthma 11/19/2009  . GERD 11/19/2009  . Cough 11/19/2009  . Hyperlipidemia 11/18/2009    DildayJenness Corner, PT 12/06/2018, 11:56 AM  [facility] [address] [place], [place], [address] Phone: [phone]   Fax:  [phone]  Name: KAMEKO HUKILL MRN: [id number] Date of Birth: 10-Jun-1940

## 2018-12-07 ENCOUNTER — Other Ambulatory Visit: Payer: Self-pay

## 2018-12-07 ENCOUNTER — Ambulatory Visit: Payer: Medicare Other | Admitting: Physical Therapy

## 2018-12-07 DIAGNOSIS — R2681 Unsteadiness on feet: Secondary | ICD-10-CM

## 2018-12-07 DIAGNOSIS — R42 Dizziness and giddiness: Secondary | ICD-10-CM | POA: Diagnosis not present

## 2018-12-07 DIAGNOSIS — R2689 Other abnormalities of gait and mobility: Secondary | ICD-10-CM

## 2018-12-08 ENCOUNTER — Encounter: Payer: Self-pay | Admitting: Physical Therapy

## 2018-12-08 NOTE — Therapy (Signed)
[facility] [address] [place], [place], [address] Phone: [phone]   Fax:  [phone]  Physical Therapy Treatment  Patient Details  Name: [name] MRN: [id number] Date of Birth: 1940/07/05 Referring Provider (PT): Binnie Rail, MD   Encounter Date: 12/07/2018  PT End of Session - 12/08/18 1632    Visit Number  5    Number of Visits  17    Date for PT Re-Evaluation  01/06/19    Authorization Type  [employer] Medicare - 10th visit PN    PT Start Time  1401    PT Stop Time  1445    PT Time Calculation (min)  44 min    Activity Tolerance  Patient tolerated treatment well    Behavior During Therapy  [facility] for tasks assessed/performed       Past Medical History:  Diagnosis Date  . Adenomatous colon polyp   . Allergy   . Anxiety   . Asthma   . Chest pain   . Chronic diastolic CHF (congestive heart failure) ([place])   . COPD (chronic obstructive pulmonary disease) ([place])    pt is unsure if has been officially diagnosed  . Coronary artery disease    CABG '09- cathed 12/09, 9/10, 6/11, 3/14 and 12/13/16- medical Rx  . Diabetes mellitus   . GERD (gastroesophageal reflux disease)   . Hiatal hernia   . Hyperlipidemia   . Hypertension   . Myocardial infarction ([place]) 2009  . PONV (postoperative nausea and vomiting)   . Schatzki's ring   . Shoulder injury    resolved after shoulder surgery  . Sleep apnea    not on cpap    Past Surgical History:  Procedure Laterality Date  . ABDOMINAL HYSTERECTOMY    . APPENDECTOMY     came out with Hysterectomy  . CARDIAC CATHETERIZATION  07/23/2009   EF 60%  . CARDIAC CATHETERIZATION  10/11/2008  . CARDIAC CATHETERIZATION  03/01/2007   EF 75-80%  . CARDIOVASCULAR STRESS TEST  11/15/2007   EF 60%  . COLONOSCOPY    . CORONARY ARTERY BYPASS GRAFT     SEVERELY DISEASED SAPHENOUS VEIN GRAFT TO THE RIGHT CORONARY ARTERY BUT WITH FAIRLY WELL PRESERVED FLOW TO THE DISTAL RIGHT  CORONARY ARTERY FROM THE NATIVE CIRCULATION-RESTART  CATH IN JUNE 2000, REVEALS MILD/MODERATE  CAD WITH GOOD FLOW DOWN HER LAD  . ESOPHAGOGASTRODUODENOSCOPY    . EYE SURGERY     bilateral cataract surgery with lens implant  . LEFT HEART CATH AND CORS/GRAFTS ANGIOGRAPHY N/A 12/14/2016   Procedure: LEFT HEART CATH AND CORS/GRAFTS ANGIOGRAPHY;  Surgeon: Jettie Booze, MD;  Location: [facility];  Service: Cardiovascular;  Laterality: N/A;  . lense removal Left   . POLYPECTOMY    . ROTATOR CUFF REPAIR     right and left  . TONSILLECTOMY     age 35  . 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 tumor removed kidney    . UPPER GASTROINTESTINAL ENDOSCOPY    . US ECHOCARDIOGRAPHY  03/08/2008   EF 55-60%    There were no vitals filed for this visit.  Subjective Assessment - 12/08/18 1627    Subjective  Pt states she had dizziness yesterday evening around dinner time - started, stopped and then restarted for approx. 20" time span    Patient  Stated Goals  To continue to work on balance and prevent falls - "dangerous at my age"    Currently in Pain?  No/denies                       [facility] Adult PT Treatment/Exercise - 12/08/18 0001      Ambulation/Gait   Curb  5: Supervision    Curb Details (indicate cue type and reason)  instructed to allow toes on stance leg to hang off edge of step for increased ROM with descension    Gait Comments  walking and tossing ball (straight ahead) 30' x 2 reps           Balance Exercises - 12/08/18 1629      Balance Exercises: Standing   Standing Eyes Opened  Foam/compliant surface;5 reps;Head turns;Wide (BOA)    Standing Eyes Closed  Wide (BOA);Head turns;Solid surface;2 reps    Rockerboard  Anterior/posterior;10 reps;UE support;Head turns;EO;EC    Step Over Hurdles / Cones  stepping over  orange hurdle 10 reps each leg for improved SLS with UE support on counter prn    Other Standing Exercises  pt performed marching in place with EO/EC; added head turns with EO and EC with UE support on counter prn       Marching on incline with EO/EC - 5 reps each; head turns with EO with min assist to CGA with marching  Alternate stepping up/back 5 reps each leg  Standing on pillows - making circles with hands for improved VOR - visually tracking with eyes with head movement; circles clockwise And counterclockwise  Standing on 2 pillows in corner - trunk rotation 5 reps straight across, diagonals X patterns 5 reps each Amb. Tossing ball 35' x 2 reps with CGA    PT Short Term Goals - 12/08/18 1638      PT SHORT TERM GOAL #1   Title  Pt will demonstrate ability to perform initial HEP MOD I    Baseline  met 12-05-18    Time  4    Period  Weeks    Status  Achieved    Target Date  12/07/18      PT SHORT TERM GOAL #2   Title  Pt will participate in further assessment of gait/balance and falls risk with FGA and SOT assessments    Baseline  FGA=18/30    Time  4    Period  Weeks    Status  On-going    Target Date  12/07/18      PT SHORT TERM GOAL #3   Title  Pt will report 25% reduction in episodes of dizziness on a weekly basis    Baseline  pt reports dizziness continues to vary in intensity - reports overall no reduction - 12-07-18    Time  4    Period  Weeks    Status  Not Met    Target Date  12/07/18      PT SHORT TERM GOAL #4   Title  Pt will report decreased motion sensitivity to head turns, sit <> R sidelying and turning quickly in standing to 1/5    Baseline  pt continues to c/o dizziness with head turns; 12-07-18    Time  4    Period  Weeks    Status  On-going    Target Date  12/07/18        PT Long Term Goals - 12/08/18 1649      PT  LONG TERM GOAL #1   Title  Pt will demonstrate ability to perform final HEP independently    Time  8    Period  Weeks     Status  New      PT LONG TERM GOAL #2   Title  Pt will improve FGA by 4 points to indicate decreased falls risk    Baseline  18/30    Time  8    Period  Weeks    Status  New      PT LONG TERM GOAL #3   Title  Pt will improve SOT by 8 points to [facility] for age group to indicate improved use of vestibular/visual/sensory system for balance    Baseline  TBD    Time  8    Period  Weeks    Status  New      PT LONG TERM GOAL #4   Title  Pt will report decreased motion sensitivity to head turns, body turns and sit <> sidelying to 0/5    Time  8    Period  Weeks    Status  New      PT LONG TERM GOAL #5   Title  Pt will demonstrate improvement in use of VOR as indicated by ability to perform VOR x 1 viewing in standing >2 minutes with mild onset of symptoms    Time  8    Period  Weeks    Status  New      PT LONG TERM GOAL #6   Title  Pt will report 50% reduction in dizziness episodes on a weekly basis    Time  8    Period  Weeks    Status  New            Plan - 12/08/18 1642    Clinical Impression Statement  Pt has met STG #1 with remaining STG's partially met or ongoing.  STG #3 not met as pt reports no major change in dizziness - dizziness is most provoked with head turns, with gaze stabilization.  Balance is improving on compliant surface with pt feeling less anxious and nervous.    PT Next Visit Plan  balance and gait training with head turns       Patient will benefit from skilled therapeutic intervention in order to improve the following deficits and impairments:     Visit Diagnosis: Unsteadiness on feet  Other abnormalities of gait and mobility  Dizziness and giddiness     Problem List Patient Active Problem List   Diagnosis Date Noted  . Chest pain at rest 08/24/2018  . Arthralgia 08/01/2017  . COPD with asthma ([place]) 05/04/2017  . Angina pectoris ([place]) 01/05/2017  . Insomnia 11/23/2016  . Chronic nonintractable headache 11/23/2016  . Pancreatic  insufficiency 10/28/2016  . Carotid artery disease ([place]) 05/19/2016  . Fatigue 05/19/2016  . Anemia 03/27/2016  . Acute bronchitis with COPD ([place]) 12/05/2015  . Type 1 diabetes mellitus ([place]) 07/21/2015  . B12 deficiency 03/31/2015  . OSA (obstructive sleep apnea) with hypoxia 11/20/2014  . Fecal incontinence 08/28/2014  . 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 Falls 08/28/2014  . Neck pain of over 3 months duration 08/28/2014  . H/O total knee replacement 02/20/2014  . Obesity (BMI 30-39.9) 11/12/2013  . Chronic diastolic CHF (congestive heart failure) ([place]) 11/17/2012  . Meniscus, lateral, anterior horn derangement 11/10/2011  . Medial meniscus, posterior horn derangement 11/10/2011  . Osteoarthritis of right knee 11/10/2011  . Vertigo  10/05/2011  . HTN (hypertension), benign 10/05/2011  . CAD (coronary artery disease) 08/27/2010  . MUSCLE CRAMPS 12/29/2009  . History of myocardial infarction 11/19/2009  . Extrinsic asthma 11/19/2009  . GERD 11/19/2009  . Cough 11/19/2009  . Hyperlipidemia 11/18/2009    DildayJenness Corner, PT 12/08/2018, 4:51 PM  [facility] [address] [place], [place], [address] Phone: [phone]   Fax:  [phone]  Name: [name] MRN: [id number] Date of Birth: 08-Jul-1940

## 2018-12-12 ENCOUNTER — Ambulatory Visit: Payer: Medicare Other | Admitting: Physical Therapy

## 2018-12-14 ENCOUNTER — Ambulatory Visit: Payer: Medicare Other | Attending: Internal Medicine | Admitting: Physical Therapy

## 2018-12-14 ENCOUNTER — Other Ambulatory Visit: Payer: Self-pay

## 2018-12-14 DIAGNOSIS — R42 Dizziness and giddiness: Secondary | ICD-10-CM | POA: Insufficient documentation

## 2018-12-14 DIAGNOSIS — R2689 Other abnormalities of gait and mobility: Secondary | ICD-10-CM | POA: Insufficient documentation

## 2018-12-14 DIAGNOSIS — R2681 Unsteadiness on feet: Secondary | ICD-10-CM

## 2018-12-15 ENCOUNTER — Encounter: Payer: Self-pay | Admitting: Physical Therapy

## 2018-12-15 NOTE — Therapy (Signed)
[place] [address] [place], [place], [address] Phone: [phone]   Fax:  [phone]  Physical Therapy Treatment  Patient Details  Name: [name] MRN: [id number] Date of Birth: 1940/05/03 Referring Provider (PT): Binnie Rail, MD   Encounter Date: 12/14/2018  PT End of Session - 12/15/18 1600    Visit Number  6    Number of Visits  17    Date for PT Re-Evaluation  01/06/19    Authorization Type  [employer] Medicare - 10th visit PN    PT Start Time  1450    PT Stop Time  1530    PT Time Calculation (min)  40 min    Activity Tolerance  Patient tolerated treatment well    Behavior During Therapy  [facility] for tasks assessed/performed       Past Medical History:  Diagnosis Date  . Adenomatous colon polyp   . Allergy   . Anxiety   . Asthma   . Chest pain   . Chronic diastolic CHF (congestive heart failure) ([place])   . COPD (chronic obstructive pulmonary disease) ([place])    pt is unsure if has been officially diagnosed  . Coronary artery disease    CABG '09- cathed 12/09, 9/10, 6/11, 3/14 and 12/13/16- medical Rx  . Diabetes mellitus   . GERD (gastroesophageal reflux disease)   . Hiatal hernia   . Hyperlipidemia   . Hypertension   . Myocardial infarction ([place]) 2009  . PONV (postoperative nausea and vomiting)   . Schatzki's ring   . Shoulder injury    resolved after shoulder surgery  . Sleep apnea    not on cpap    Past Surgical History:  Procedure Laterality Date  . ABDOMINAL HYSTERECTOMY    . APPENDECTOMY     came out with Hysterectomy  . CARDIAC CATHETERIZATION  07/23/2009   EF 60%  . CARDIAC CATHETERIZATION  10/11/2008  . CARDIAC CATHETERIZATION  03/01/2007   EF 75-80%  . CARDIOVASCULAR STRESS TEST  11/15/2007   EF 60%  . COLONOSCOPY    . CORONARY ARTERY BYPASS GRAFT     SEVERELY DISEASED SAPHENOUS VEIN GRAFT TO THE RIGHT CORONARY ARTERY BUT WITH FAIRLY WELL PRESERVED FLOW TO THE DISTAL RIGHT CORONARY  ARTERY FROM THE NATIVE CIRCULATION-RESTART  CATH IN JUNE 2000, REVEALS MILD/MODERATE  CAD WITH GOOD FLOW DOWN HER LAD  . ESOPHAGOGASTRODUODENOSCOPY    . EYE SURGERY     bilateral cataract surgery with lens implant  . LEFT HEART CATH AND CORS/GRAFTS ANGIOGRAPHY N/A 12/14/2016   Procedure: LEFT HEART CATH AND CORS/GRAFTS ANGIOGRAPHY;  Surgeon: Jettie Booze, MD;  Location: [place] [facility];  Service: Cardiovascular;  Laterality: N/A;  . lense removal Left   . POLYPECTOMY    . ROTATOR CUFF REPAIR     right and left  . TONSILLECTOMY     age 78  . 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 tumor removed kidney    . UPPER GASTROINTESTINAL ENDOSCOPY    . US ECHOCARDIOGRAPHY  03/08/2008   EF 55-60%    There were no vitals filed for this visit.  Subjective Assessment - 12/14/18 1451    Subjective  Pt reports no change since previous PT session - does state that she feels her balance may be getting a little better  Patient Stated Goals  To continue to work on balance and prevent falls - "dangerous at my age"    Currently in Pain?  No/denies                       [facility] Adult PT Treatment/Exercise - 12/15/18 0001      Ambulation/Gait   Curb  5: Supervision    Curb Details (indicate cue type and reason)  cues to allow toes on stance leg to hang over edge of curb for incr. ROM with descent     Gait Comments  walking and tossing ball (straight ahead) 30' x 2 reps           Balance Exercises - 12/15/18 1556      Balance Exercises: Standing   Standing Eyes Opened  Foam/compliant surface;5 reps;Head turns;Wide (BOA)    Standing Eyes Closed  Wide (BOA);Head turns;Solid surface;2 reps    Rockerboard  Anterior/posterior;10 reps;UE support;Head turns;EO;EC;Lateral    Step Over Hurdles / Cones  stepping over balance beam 10 reps each  leg with UE support prn    Other Standing Exercises  pt performed marching in place with EO/EC; added head turns with EO and EC with UE support on counter prn        Pt performed gaze stabilization exercise - amb. Making circles clockwise and counterclockwise with ball - 10' x 2 reps each Inside // bars - pt had no LOB with this activity - pt did c/o dizziness after exercise was completed with short rest break needed   PT Short Term Goals - 12/08/18 1638      PT SHORT TERM GOAL #1   Title  Pt will demonstrate ability to perform initial HEP MOD I    Baseline  met 12-05-18    Time  4    Period  Weeks    Status  Achieved    Target Date  12/07/18      PT SHORT TERM GOAL #2   Title  Pt will participate in further assessment of gait/balance and falls risk with FGA and SOT assessments    Baseline  FGA=18/30    Time  4    Period  Weeks    Status  On-going    Target Date  12/07/18      PT SHORT TERM GOAL #3   Title  Pt will report 25% reduction in episodes of dizziness on a weekly basis    Baseline  pt reports dizziness continues to vary in intensity - reports overall no reduction - 12-07-18    Time  4    Period  Weeks    Status  Not Met    Target Date  12/07/18      PT SHORT TERM GOAL #4   Title  Pt will report decreased motion sensitivity to head turns, sit <> R sidelying and turning quickly in standing to 1/5    Baseline  pt continues to c/o dizziness with head turns; 12-07-18    Time  4    Period  Weeks    Status  On-going    Target Date  12/07/18        PT Long Term Goals - 12/15/18 1603      PT LONG TERM GOAL #1   Title  Pt will demonstrate ability to perform final HEP independently    Time  8    Period  Weeks    Status  New  PT LONG TERM GOAL #2   Title  Pt will improve FGA by 4 points to indicate decreased falls risk    Baseline  18/30    Time  8    Period  Weeks    Status  New      PT LONG TERM GOAL #3   Title  Pt will improve SOT by 8 points to [facility]  for age group to indicate improved use of vestibular/visual/sensory system for balance    Baseline  TBD    Time  8    Period  Weeks    Status  New      PT LONG TERM GOAL #4   Title  Pt will report decreased motion sensitivity to head turns, body turns and sit <> sidelying to 0/5    Time  8    Period  Weeks    Status  New      PT LONG TERM GOAL #5   Title  Pt will demonstrate improvement in use of VOR as indicated by ability to perform VOR x 1 viewing in standing >2 minutes with mild onset of symptoms    Time  8    Period  Weeks    Status  New      PT LONG TERM GOAL #6   Title  Pt will report 50% reduction in dizziness episodes on a weekly basis    Time  8    Period  Weeks    Status  New            Plan - 12/14/18 1451    Clinical Impression Statement  Pt is progressing well towards goals- pt demonstrates less anxiety and nervousness with activities with eyes closed; continues to have > c/o dizziness with activities with head and eye movements    Comorbidities  sleep apnea - currently not using CPAP and is due for another sleep study, chronic headaches, shoulder injury, MI, HTN, HLD, hiatal hernia, GERD, diabetes, CAD, COPD, asthma, CHF, anxiety, R TKA, and R and L rotator cuff repair    PT Frequency  2x / week    PT Duration  8 weeks    PT Treatment/Interventions  ADLs/Self Care Home Management;Canalith Repostioning;Cryotherapy;Moist Heat;Gait training;Stair training;Functional mobility training;Therapeutic activities;Therapeutic exercise;Balance training;Neuromuscular re-education;Patient/family education;Manual techniques;Passive range of motion;Dry needling;Vestibular;Visual/perceptual remediation/compensation    PT Next Visit Plan  balance and gait training with head turns       Patient will benefit from skilled therapeutic intervention in order to improve the following deficits and impairments:  Decreased balance, Difficulty walking, Dizziness, Impaired sensation,  Impaired vision/preception, Pain  Visit Diagnosis: Other abnormalities of gait and mobility  Unsteadiness on feet     Problem List Patient Active Problem List   Diagnosis Date Noted  . Chest pain at rest 08/24/2018  . Arthralgia 08/01/2017  . COPD with asthma ([place]) 05/04/2017  . Angina pectoris ([place]) 01/05/2017  . Insomnia 11/23/2016  . Chronic nonintractable headache 11/23/2016  . Pancreatic insufficiency 10/28/2016  . Carotid artery disease ([place]) 05/19/2016  . Fatigue 05/19/2016  . Anemia 03/27/2016  . Acute bronchitis with COPD ([place]) 12/05/2015  . Type 1 diabetes mellitus ([place]) 07/21/2015  . B12 deficiency 03/31/2015  . OSA (obstructive sleep apnea) with hypoxia 11/20/2014  . Fecal incontinence 08/28/2014  . 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 Falls 08/28/2014  . Neck pain of over 3 months duration 08/28/2014  . H/O total knee replacement 02/20/2014  . Obesity (BMI 30-39.9) 11/12/2013  . Chronic diastolic  CHF (congestive heart failure) ([place]) 11/17/2012  . Meniscus, lateral, anterior horn derangement 11/10/2011  . Medial meniscus, posterior horn derangement 11/10/2011  . Osteoarthritis of right knee 11/10/2011  . Vertigo 10/05/2011  . HTN (hypertension), benign 10/05/2011  . CAD (coronary artery disease) 08/27/2010  . MUSCLE CRAMPS 12/29/2009  . History of myocardial infarction 11/19/2009  . Extrinsic asthma 11/19/2009  . GERD 11/19/2009  . Cough 11/19/2009  . Hyperlipidemia 11/18/2009    DildayJenness Corner, PT 12/15/2018, 4:05 PM  [place] [address] [place], [place], [address] Phone: [phone]   Fax:  [phone]  Name: [name] MRN: [id number] Date of Birth: 16-Nov-1940

## 2018-12-19 ENCOUNTER — Other Ambulatory Visit: Payer: Self-pay

## 2018-12-19 ENCOUNTER — Ambulatory Visit: Payer: Medicare Other | Admitting: Physical Therapy

## 2018-12-19 DIAGNOSIS — R2681 Unsteadiness on feet: Secondary | ICD-10-CM

## 2018-12-19 DIAGNOSIS — R2689 Other abnormalities of gait and mobility: Secondary | ICD-10-CM | POA: Diagnosis not present

## 2018-12-20 ENCOUNTER — Encounter: Payer: Self-pay | Admitting: Physical Therapy

## 2018-12-20 NOTE — Progress Notes (Signed)
Virtual Visit via Telephone Note The purpose of this virtual visit is to provide medical care while limiting exposure to the novel coronavirus.    Consent was obtained for phone visit:  Yes.   Answered questions that patient had about telehealth interaction:  Yes.   I discussed the limitations, risks, security and privacy concerns of performing an evaluation and management service by telephone. I also discussed with the patient that there may be a patient responsible charge related to this service. The patient expressed understanding and agreed to proceed.  Pt location: Home Physician Location: Home Name of referring provider:  Binnie Rail, MD I connected with .[name] at patients initiation/request on 12/21/2018 at 10:50 AM EST by telephone and verified that I am speaking with the correct person using two identifiers.  Pt MRN:  [id number] Pt DOB:  Mar 05, 1940   History of Present Illness:  [name] a 78 year old woman with coronary artery disease, hypertension, type 2 diabetes mellitus, hyperlipidemia, B12 deficiency and obstructive sleep apnea who follows up for migraines.  UPDATE: She had another sleep study.  A CPAP has been ordered and she should be getting it by Monday. Due to change in weather, she has had increased headache.  Intensity:  Moderate to severe Duration:  Wakes up at 5 AM with headache and resolves by 11 AM.  However, it returns in the evening. Frequency:  They are now daily again. Frequency of abortive medication:Tylenol every 4 hours daily Current NSAIDS:ASA 81mg  Current analgesics:Tylenol Current triptans:no Current ergotamine:no Current anti-emetic:Promethazine 25mg  Current muscle relaxants:Baclofen 20mg  twice daily Current anti-anxiolytic:Klonopin Current sleep aide:trazodone Current Antihypertensive medications:Toprol XL 50mg , Cardizem, Lasix, Imdur Current Antidepressant medications:Nortriptyline75mg  Current  Anticonvulsant medications:no Current anti-CGRP:no Current Vitamins/Herbal/Supplements:B12 Current Antihistamines/Decongestants:Allegra, Singulair Other therapy:ice pack  Caffeine:1 cup coffee daily, tea Alcohol:no Smoker:no Diet:hydrates Exercise:yes Depression:no; Anxiety:no Other pain:no Sleep hygiene:Poor.Untreated OSA.  She was taken off of CPAP by her prior sleep specialist because of reported noncompliance.  However, [name] insists she wore the mask all night and headaches were improved while she used the machine.   HISTORY: Onset: She has history of migraines as a young woman which resolved after having children. Current headaches started in2016. Location: She has constant holocephalic headache. She has severe headache radiating from the base of her neck on the right up Quality: pounding Initial Intensity: 10/10 Aura: no Prodrome: no Postdrome: no Associated symptoms:  None.  Nonausea,vomiting, photophobia, phonophobiaor visual disturbance. She has not had any new worse headache of her life, waking up from sleep Initial Duration: All day Initial Frequency: Daily headache, severe Headache every other day Initial Frequency of abortive medication: daily Triggers:  None Relieving factors:  None Activity: aggravates  Past NSAIDS: Ibuprofen, naproxen (caused increased heart rate) Past analgesics: Excedrin, tramadol and other opioids (adverse reaction) Past abortive triptans: no Past muscle relaxants:Robaxin, Flexeril Past anti-emetic: Promethazine (effective) Past antihypertensive medications: losartan Past antidepressant medications: no Past anticonvulsant medications: Gabapentin 200mg  (drowsiness) Past vitamins/Herbal/Supplements: CoQ10 Past antihistamines/decongestants: no Other past therapy: Physical therapy of neck.  Family history of headache: father  MRI of brain without contrast from  09/16/14 was personally reviewed and revealed partially empty sella but otherwise unremarkable.  MRI of cervical spine from 09/16/14 was personally reviewed and demonstrated multilevel degenerative changes including left greater than right spurring at C3-C4 causing left foraminal narrowing, severe spurring at C4-C5 causing right moderate foraminal narrowing, and disc bulg and spurring at C6-C7 with moderate bilateral foraminal narrowing. She was evaluated by Dr. Nelva Bush  for interventional pain (epidural or facet joint injections). He didn't think it was a good idea given that she would have to stop Plavix.    Observations/Objective:   Height 5\' 2"  (1.575 m), weight 189 lb (85.7 kg). Alert and oriented.  Speech fluent and not dysarthric.  Language intact.   Assessment and Plan:   Chronic headaches. May be tension-type headache related to sleep apnea (although states she is wearing an O2 mask at night, but she reports improvement since initiating O2), cervicogenic etiology or medication overuse. Symptoms described are not consistent with migraines.  1.  No change to medication.  I want to see how she responds to nightly use of CPAP and limiting Tylenol use to no more than 2 days out of week. 2.  Nortriptyline 75mg  at bedtime 3.  Tylenol for abortive therapy 4.  Follow up in 4 months.   Follow Up Instructions:    -I discussed the assessment and treatment plan with the patient. The patient was provided an opportunity to ask questions and all were answered. The patient agreed with the plan and demonstrated an understanding of the instructions.   The patient was advised to call back or seek an in-person evaluation if the symptoms worsen or if the condition fails to improve as anticipated.    Total Time spent in visit with the patient was:  11 minutes   [name], DO

## 2018-12-20 NOTE — Therapy (Signed)
[facility] [address] [place], [place], [address] Phone: [phone]   Fax:  [phone]  Physical Therapy Treatment  Patient Details  Name: [name] MRN: [id number] Date of Birth: 05-11-1940 Referring Provider (PT): Binnie Rail, MD   Encounter Date: 12/19/2018  PT End of Session - 12/20/18 1435    Visit Number  7    Number of Visits  17    Date for PT Re-Evaluation  01/06/19    Authorization Type  [employer] Medicare - 10th visit PN    PT Start Time  1405    PT Stop Time  1447    PT Time Calculation (min)  42 min    Activity Tolerance  Patient tolerated treatment well    Behavior During Therapy  [facility] for tasks assessed/performed       Past Medical History:  Diagnosis Date  . Adenomatous colon polyp   . Allergy   . Anxiety   . Asthma   . Chest pain   . Chronic diastolic CHF (congestive heart failure) ([place])   . COPD (chronic obstructive pulmonary disease) ([place])    pt is unsure if has been officially diagnosed  . Coronary artery disease    CABG '09- cathed 12/09, 9/10, 6/11, 3/14 and 12/13/16- medical Rx  . Diabetes mellitus   . GERD (gastroesophageal reflux disease)   . Hiatal hernia   . Hyperlipidemia   . Hypertension   . Myocardial infarction ([place]) 2009  . PONV (postoperative nausea and vomiting)   . Schatzki's ring   . Shoulder injury    resolved after shoulder surgery  . Sleep apnea    not on cpap    Past Surgical History:  Procedure Laterality Date  . ABDOMINAL HYSTERECTOMY    . APPENDECTOMY     came out with Hysterectomy  . CARDIAC CATHETERIZATION  07/23/2009   EF 60%  . CARDIAC CATHETERIZATION  10/11/2008  . CARDIAC CATHETERIZATION  03/01/2007   EF 75-80%  . CARDIOVASCULAR STRESS TEST  11/15/2007   EF 60%  . COLONOSCOPY    . CORONARY ARTERY BYPASS GRAFT     SEVERELY DISEASED SAPHENOUS VEIN GRAFT TO THE RIGHT CORONARY ARTERY BUT WITH FAIRLY WELL PRESERVED FLOW TO THE DISTAL RIGHT  CORONARY ARTERY FROM THE NATIVE CIRCULATION-RESTART  CATH IN JUNE 2000, REVEALS MILD/MODERATE  CAD WITH GOOD FLOW DOWN HER LAD  . ESOPHAGOGASTRODUODENOSCOPY    . EYE SURGERY     bilateral cataract surgery with lens implant  . LEFT HEART CATH AND CORS/GRAFTS ANGIOGRAPHY N/A 12/14/2016   Procedure: LEFT HEART CATH AND CORS/GRAFTS ANGIOGRAPHY;  Surgeon: Jettie Booze, MD;  Location: [place] [facility];  Service: Cardiovascular;  Laterality: N/A;  . lense removal Left   . POLYPECTOMY    . ROTATOR CUFF REPAIR     right and left  . TONSILLECTOMY     age 35  . 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 tumor removed kidney    . UPPER GASTROINTESTINAL ENDOSCOPY    . US ECHOCARDIOGRAPHY  03/08/2008   EF 55-60%    There were no vitals filed for this visit.  Subjective Assessment - 12/20/18 1426    Subjective  Pt reports she had some dizziness that lasted about 10" on Monday - states she had to sit down and then it resolved; doesn't know  why it occurred    Patient Stated Goals  To continue to work on balance and prevent falls - "dangerous at my age"    Currently in Pain?  No/denies                       [facility] Adult PT Treatment/Exercise - 12/20/18 0001      Ambulation/Gait   Curb  5: Supervision    Curb Details (indicate cue type and reason)  cues for foot position on stance leg      2 reps for curb negotiation as stated above    Balance Exercises - 12/20/18 1433      Balance Exercises: Standing   Standing Eyes Opened  Wide (BOA);Head turns;Foam/compliant surface;5 reps   inside // bars   Rockerboard  Anterior/posterior;10 reps;UE support;Head turns;EO;EC;Lateral    Gait with Head Turns  2 reps;Forward;Retro   insdie // bars   Step Over Hurdles / Cones  stepping over balance beam 10 reps each leg with UE support prn     Other Standing Exercises  marching forward/backward 10' x 4 reps inside bars with head turns on 2 final reps- iwth UE support prn          PT Short Term Goals - 12/20/18 1438      PT SHORT TERM GOAL #1   Title  Pt will demonstrate ability to perform initial HEP MOD I    Baseline  met 12-05-18    Time  4    Period  Weeks    Status  Achieved    Target Date  12/07/18      PT SHORT TERM GOAL #2   Title  Pt will participate in further assessment of gait/balance and falls risk with FGA and SOT assessments    Baseline  FGA=18/30    Time  4    Period  Weeks    Status  On-going    Target Date  12/07/18      PT SHORT TERM GOAL #3   Title  Pt will report 25% reduction in episodes of dizziness on a weekly basis    Baseline  pt reports dizziness continues to vary in intensity - reports overall no reduction - 12-07-18    Time  4    Period  Weeks    Status  Not Met    Target Date  12/07/18      PT SHORT TERM GOAL #4   Title  Pt will report decreased motion sensitivity to head turns, sit <> R sidelying and turning quickly in standing to 1/5    Baseline  pt continues to c/o dizziness with head turns; 12-07-18    Time  4    Period  Weeks    Status  On-going    Target Date  12/07/18        PT Long Term Goals - 12/20/18 1438      PT LONG TERM GOAL #1   Title  Pt will demonstrate ability to perform final HEP independently    Time  8    Period  Weeks    Status  New      PT LONG TERM GOAL #2   Title  Pt will improve FGA by 4 points to indicate decreased falls risk    Baseline  18/30    Time  8    Period  Weeks    Status  New      PT LONG TERM GOAL #3  Title  Pt will improve SOT by 8 points to [facility] for age group to indicate improved use of vestibular/visual/sensory system for balance    Baseline  TBD    Time  8    Period  Weeks    Status  New      PT LONG TERM GOAL #4   Title  Pt will report decreased motion sensitivity to head turns, body turns and sit <> sidelying to 0/5     Time  8    Period  Weeks    Status  New      PT LONG TERM GOAL #5   Title  Pt will demonstrate improvement in use of VOR as indicated by ability to perform VOR x 1 viewing in standing >2 minutes with mild onset of symptoms    Time  8    Period  Weeks    Status  New      PT LONG TERM GOAL #6   Title  Pt will report 50% reduction in dizziness episodes on a weekly basis    Time  8    Period  Weeks    Status  New            Plan - 12/20/18 1436    Clinical Impression Statement  Pt continues to progress well towards goals but has difficulty with head and eye movements, stating she must have time to allow "my eyes to catch up with my head" as these activities provoke some dizziness.  Pt does appear to recover more quickly than in initial PT sessions.  Pt reports balance is slowly improving.    Comorbidities  sleep apnea - currently not using CPAP and is due for another sleep study, chronic headaches, shoulder injury, MI, HTN, HLD, hiatal hernia, GERD, diabetes, CAD, COPD, asthma, CHF, anxiety, R TKA, and R and L rotator cuff repair    PT Frequency  2x / week    PT Duration  8 weeks    PT Treatment/Interventions  ADLs/Self Care Home Management;Canalith Repostioning;Cryotherapy;Moist Heat;Gait training;Stair training;Functional mobility training;Therapeutic activities;Therapeutic exercise;Balance training;Neuromuscular re-education;Patient/family education;Manual techniques;Passive range of motion;Dry needling;Vestibular;Visual/perceptual remediation/compensation    PT Next Visit Plan  balance and gait training with head turns       Patient will benefit from skilled therapeutic intervention in order to improve the following deficits and impairments:  Decreased balance, Difficulty walking, Dizziness, Impaired sensation, Impaired vision/preception, Pain  Visit Diagnosis: Other abnormalities of gait and mobility  Unsteadiness on feet     Problem List Patient Active Problem List    Diagnosis Date Noted  . Chest pain at rest 08/24/2018  . Arthralgia 08/01/2017  . COPD with asthma ([place]) 05/04/2017  . Angina pectoris ([place]) 01/05/2017  . Insomnia 11/23/2016  . Chronic nonintractable headache 11/23/2016  . Pancreatic insufficiency 10/28/2016  . Carotid artery disease ([place]) 05/19/2016  . Fatigue 05/19/2016  . Anemia 03/27/2016  . Acute bronchitis with COPD ([place]) 12/05/2015  . Type 1 diabetes mellitus ([place]) 07/21/2015  . B12 deficiency 03/31/2015  . OSA (obstructive sleep apnea) with hypoxia 11/20/2014  . Fecal incontinence 08/28/2014  . 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 Falls 08/28/2014  . Neck pain of over 3 months duration 08/28/2014  . H/O total knee replacement 02/20/2014  . Obesity (BMI 30-39.9) 11/12/2013  . Chronic diastolic CHF (congestive heart failure) ([place]) 11/17/2012  . Meniscus, lateral, anterior horn derangement 11/10/2011  . Medial meniscus, posterior horn derangement 11/10/2011  . Osteoarthritis of right knee 11/10/2011  .  Vertigo 10/05/2011  . HTN (hypertension), benign 10/05/2011  . CAD (coronary artery disease) 08/27/2010  . MUSCLE CRAMPS 12/29/2009  . History of myocardial infarction 11/19/2009  . Extrinsic asthma 11/19/2009  . GERD 11/19/2009  . Cough 11/19/2009  . Hyperlipidemia 11/18/2009    Alda Lea, PT 12/20/2018, 2:39 PM  [facility] [address] [place], [place], [address] Phone: [phone]   Fax:  [phone]  Name: RONEE RANGANATHAN MRN: [id number] Date of Birth: 11-Aug-1940

## 2018-12-21 ENCOUNTER — Telehealth (INDEPENDENT_AMBULATORY_CARE_PROVIDER_SITE_OTHER): Payer: Medicare Other | Admitting: Neurology

## 2018-12-21 ENCOUNTER — Other Ambulatory Visit: Payer: Self-pay

## 2018-12-21 ENCOUNTER — Encounter: Payer: Self-pay | Admitting: Neurology

## 2018-12-21 ENCOUNTER — Telehealth: Payer: Self-pay | Admitting: Neurology

## 2018-12-21 ENCOUNTER — Ambulatory Visit: Payer: Medicare Other | Admitting: Physical Therapy

## 2018-12-21 VITALS — Ht 62.0 in | Wt 189.0 lb

## 2018-12-21 DIAGNOSIS — G44229 Chronic tension-type headache, not intractable: Secondary | ICD-10-CM | POA: Diagnosis not present

## 2018-12-21 DIAGNOSIS — G4733 Obstructive sleep apnea (adult) (pediatric): Secondary | ICD-10-CM

## 2018-12-21 DIAGNOSIS — R2689 Other abnormalities of gait and mobility: Secondary | ICD-10-CM | POA: Diagnosis not present

## 2018-12-21 DIAGNOSIS — R42 Dizziness and giddiness: Secondary | ICD-10-CM

## 2018-12-21 DIAGNOSIS — R2681 Unsteadiness on feet: Secondary | ICD-10-CM

## 2018-12-21 DIAGNOSIS — G4440 Drug-induced headache, not elsewhere classified, not intractable: Secondary | ICD-10-CM

## 2018-12-21 NOTE — Telephone Encounter (Signed)
Called patient and left a message to call back to schedule with Dr. Tomi Likens for: follow up in 4 months (telephone visit)

## 2018-12-22 ENCOUNTER — Encounter: Payer: Self-pay | Admitting: Physical Therapy

## 2018-12-22 NOTE — Telephone Encounter (Signed)
Called patient and left a message to call back to schedule with Dr. Tomi Likens for: follow up in 4 months (telephone visit)

## 2018-12-22 NOTE — Therapy (Addendum)
[facility] [address] [place], [place], [address] Phone: [phone]   Fax:  [phone]  Physical Therapy Treatment  Patient Details  Name: [name] MRN: [id number] Date of Birth: 1940/09/21 Referring Provider (PT): Binnie Rail, MD   Encounter Date: 12/21/2018  PT End of Session - 12/22/18 1937    Visit Number  8    Number of Visits  17    Date for PT Re-Evaluation  01/06/19    Authorization Type  [employer] Medicare - 10th visit PN    PT Start Time  1231    PT Stop Time  1317    PT Time Calculation (min)  46 min    Activity Tolerance  Patient tolerated treatment well    Behavior During Therapy  [facility] for tasks assessed/performed       Past Medical History:  Diagnosis Date  . Adenomatous colon polyp   . Allergy   . Anxiety   . Asthma   . Chest pain   . Chronic diastolic CHF (congestive heart failure) ([place])   . COPD (chronic obstructive pulmonary disease) ([place])    pt is unsure if has been officially diagnosed  . Coronary artery disease    CABG '09- cathed 12/09, 9/10, 6/11, 3/14 and 12/13/16- medical Rx  . Diabetes mellitus   . GERD (gastroesophageal reflux disease)   . Hiatal hernia   . Hyperlipidemia   . Hypertension   . Myocardial infarction ([place]) 2009  . PONV (postoperative nausea and vomiting)   . Schatzki's ring   . Shoulder injury    resolved after shoulder surgery  . Sleep apnea    not on cpap    Past Surgical History:  Procedure Laterality Date  . ABDOMINAL HYSTERECTOMY    . APPENDECTOMY     came out with Hysterectomy  . CARDIAC CATHETERIZATION  07/23/2009   EF 60%  . CARDIAC CATHETERIZATION  10/11/2008  . CARDIAC CATHETERIZATION  03/01/2007   EF 75-80%  . CARDIOVASCULAR STRESS TEST  11/15/2007   EF 60%  . COLONOSCOPY    . CORONARY ARTERY BYPASS GRAFT     SEVERELY DISEASED SAPHENOUS VEIN GRAFT TO THE RIGHT CORONARY ARTERY BUT WITH FAIRLY WELL PRESERVED FLOW TO THE DISTAL RIGHT  CORONARY ARTERY FROM THE NATIVE CIRCULATION-RESTART  CATH IN JUNE 2000, REVEALS MILD/MODERATE  CAD WITH GOOD FLOW DOWN HER LAD  . ESOPHAGOGASTRODUODENOSCOPY    . EYE SURGERY     bilateral cataract surgery with lens implant  . LEFT HEART CATH AND CORS/GRAFTS ANGIOGRAPHY N/A 12/14/2016   Procedure: LEFT HEART CATH AND CORS/GRAFTS ANGIOGRAPHY;  Surgeon: Jettie Booze, MD;  Location: [facility];  Service: Cardiovascular;  Laterality: N/A;  . lense removal Left   . POLYPECTOMY    . ROTATOR CUFF REPAIR     right and left  . TONSILLECTOMY     age 28  . 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 tumor removed kidney    . UPPER GASTROINTESTINAL ENDOSCOPY    . US ECHOCARDIOGRAPHY  03/08/2008   EF 55-60%    There were no vitals filed for this visit.  Subjective Assessment - 12/22/18 1933    Subjective  Pt reports no new problems since previous visit    Patient Stated Goals  To continue to work on balance and prevent falls - "  dangerous at my age"    Currently in Pain?  No/denies                            Balance Exercises - 12/22/18 1934      Balance Exercises: Standing   Standing Eyes Opened  Wide ([place]);Head turns;Foam/compliant surface;5 reps   on blue balance beam   Standing Eyes Closed  Wide (BOA);Head turns;Foam/compliant surface;5 reps    Rockerboard  Anterior/posterior;Head turns;EO;EC;Intermittent UE support;10 reps    Gait with Head Turns  Forward;Retro;2 reps    Step Over Hurdles / Cones  stepping over orange hurdle 10 reps each leg for improved SLS with UE support on counter prn    Other Standing Exercises  marching forward/backward inside // bars 2 reps each direction with head turns side to side          PT Short Term Goals - 12/22/18 1942      PT SHORT TERM GOAL #1   Title  Pt will demonstrate  ability to perform initial HEP MOD I    Baseline  met 12-05-18    Time  4    Period  Weeks    Status  Achieved    Target Date  12/07/18      PT SHORT TERM GOAL #2   Title  Pt will participate in further assessment of gait/balance and falls risk with FGA and SOT assessments    Baseline  FGA=18/30    Time  4    Period  Weeks    Status  On-going    Target Date  12/07/18      PT SHORT TERM GOAL #3   Title  Pt will report 25% reduction in episodes of dizziness on a weekly basis    Baseline  pt reports dizziness continues to vary in intensity - reports overall no reduction - 12-07-18    Time  4    Period  Weeks    Status  Not Met    Target Date  12/07/18      PT SHORT TERM GOAL #4   Title  Pt will report decreased motion sensitivity to head turns, sit <> R sidelying and turning quickly in standing to 1/5    Baseline  pt continues to c/o dizziness with head turns; 12-07-18    Time  4    Period  Weeks    Status  On-going    Target Date  12/07/18        PT Long Term Goals - 12/22/18 1942      PT LONG TERM GOAL #1   Title  Pt will demonstrate ability to perform final HEP independently    Time  8    Period  Weeks    Status  New      PT LONG TERM GOAL #2   Title  Pt will improve FGA by 4 points to indicate decreased falls risk    Baseline  18/30    Time  8    Period  Weeks    Status  New      PT LONG TERM GOAL #3   Title  Pt will improve SOT by 8 points to [facility] for age group to indicate improved use of vestibular/visual/sensory system for balance    Baseline  TBD    Time  8    Period  Weeks    Status  New      PT LONG TERM  GOAL #4   Title  Pt will report decreased motion sensitivity to head turns, body turns and sit <> sidelying to 0/5    Time  8    Period  Weeks    Status  New      PT LONG TERM GOAL #5   Title  Pt will demonstrate improvement in use of VOR as indicated by ability to perform VOR x 1 viewing in standing >2 minutes with mild onset of symptoms    Time   8    Period  Weeks    Status  New      PT LONG TERM GOAL #6   Title  Pt will report 50% reduction in dizziness episodes on a weekly basis    Time  8    Period  Weeks    Status  New            Plan - 12/22/18 1937    Clinical Impression Statement  Pt became nauseous with vestibular activities - occurred after standing on foam with trunk rotations in various patterns; this activity was performed after standing balance activities inside // bars with EO and EC with head turns - appeared to be excessive stimulation of vestibular system as exercises were progressed quickly with minimal rest breaks.  Pt recovered after approx. 5" rest period and cool cloth was given.    Comorbidities  sleep apnea - currently not using CPAP and is due for another sleep study, chronic headaches, shoulder injury, MI, HTN, HLD, hiatal hernia, GERD, diabetes, CAD, COPD, asthma, CHF, anxiety, R TKA, and R and L rotator cuff repair    PT Frequency  2x / week    PT Duration  8 weeks    PT Treatment/Interventions  ADLs/Self Care Home Management;Canalith Repostioning;Cryotherapy;Moist Heat;Gait training;Stair training;Functional mobility training;Therapeutic activities;Therapeutic exercise;Balance training;Neuromuscular re-education;Patient/family education;Manual techniques;Passive range of motion;Dry needling;Vestibular;Visual/perceptual remediation/compensation    PT Next Visit Plan  balance and gait training with head turns       Patient will benefit from skilled therapeutic intervention in order to improve the following deficits and impairments:  Decreased balance, Difficulty walking, Dizziness, Impaired sensation, Impaired vision/preception, Pain  Visit Diagnosis: Other abnormalities of gait and mobility  Unsteadiness on feet  Dizziness and giddiness     Problem List Patient Active Problem List   Diagnosis Date Noted  . Chest pain at rest 08/24/2018  . Arthralgia 08/01/2017  . COPD with asthma ([place])  05/04/2017  . Angina pectoris ([place]) 01/05/2017  . Insomnia 11/23/2016  . Chronic nonintractable headache 11/23/2016  . Pancreatic insufficiency 10/28/2016  . Carotid artery disease ([place]) 05/19/2016  . Fatigue 05/19/2016  . Anemia 03/27/2016  . Acute bronchitis with COPD ([place]) 12/05/2015  . Type 1 diabetes mellitus ([place]) 07/21/2015  . B12 deficiency 03/31/2015  . OSA (obstructive sleep apnea) with hypoxia 11/20/2014  . Fecal incontinence 08/28/2014  . 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 Falls 08/28/2014  . Neck pain of over 3 months duration 08/28/2014  . H/O total knee replacement 02/20/2014  . Obesity (BMI 30-39.9) 11/12/2013  . Chronic diastolic CHF (congestive heart failure) ([place]) 11/17/2012  . Meniscus, lateral, anterior horn derangement 11/10/2011  . Medial meniscus, posterior horn derangement 11/10/2011  . Osteoarthritis of right knee 11/10/2011  . Vertigo 10/05/2011  . HTN (hypertension), benign 10/05/2011  . CAD (coronary artery disease) 08/27/2010  . MUSCLE CRAMPS 12/29/2009  . History of myocardial infarction 11/19/2009  . Extrinsic asthma 11/19/2009  . GERD 11/19/2009  . Cough 11/19/2009  .  Hyperlipidemia 11/18/2009    DildayJenness Corner, PT 12/22/2018, 8:23 PM  [facility] [address] [place], [place], [address] Phone: [phone]   Fax:  [phone]  Name: SION THANE MRN: [id number] Date of Birth: 05-07-1940

## 2018-12-25 NOTE — Telephone Encounter (Signed)
Called patient and left a message to call back to schedule with Dr. Tomi Likens [phone] up in 4 months (telephone visit)

## 2018-12-26 ENCOUNTER — Ambulatory Visit: Payer: Medicare Other | Admitting: Physical Therapy

## 2018-12-26 ENCOUNTER — Other Ambulatory Visit: Payer: Self-pay

## 2018-12-26 DIAGNOSIS — R2689 Other abnormalities of gait and mobility: Secondary | ICD-10-CM

## 2018-12-26 DIAGNOSIS — R2681 Unsteadiness on feet: Secondary | ICD-10-CM

## 2018-12-26 DIAGNOSIS — R42 Dizziness and giddiness: Secondary | ICD-10-CM

## 2018-12-27 ENCOUNTER — Encounter: Payer: Self-pay | Admitting: Physical Therapy

## 2018-12-27 NOTE — Telephone Encounter (Signed)
Sending to clinical staff for review: Okay to sign/close encounter or is further follow up needed?  Called patient 3 times on different days for future scheduling and she has not returned calls.

## 2018-12-27 NOTE — Telephone Encounter (Signed)
Ok to sign note Patient will need to call office to schedule appt No more attempts will be made

## 2018-12-27 NOTE — Therapy (Signed)
[facility] [address] [place], [place], [address] Phone: [phone]   Fax:  [phone]  Physical Therapy Treatment  Patient Details  Name: [name] MRN: [id number] Date of Birth: Sep 26, 1940 Referring Provider (PT): Binnie Rail, MD   Encounter Date: 12/26/2018  PT End of Session - 12/27/18 2050    Visit Number  9    Number of Visits  17    Date for PT Re-Evaluation  01/06/19    Authorization Type  [employer] Medicare - 10th visit PN    PT Start Time  1402    PT Stop Time  1446    PT Time Calculation (min)  44 min    Activity Tolerance  Patient tolerated treatment well    Behavior During Therapy  [facility] for tasks assessed/performed       Past Medical History:  Diagnosis Date  . Adenomatous colon polyp   . Allergy   . Anxiety   . Asthma   . Chest pain   . Chronic diastolic CHF (congestive heart failure) ([place])   . COPD (chronic obstructive pulmonary disease) ([place])    pt is unsure if has been officially diagnosed  . Coronary artery disease    CABG '09- cathed 12/09, 9/10, 6/11, 3/14 and 12/13/16- medical Rx  . Diabetes mellitus   . GERD (gastroesophageal reflux disease)   . Hiatal hernia   . Hyperlipidemia   . Hypertension   . Myocardial infarction ([place]) 2009  . PONV (postoperative nausea and vomiting)   . Schatzki's ring   . Shoulder injury    resolved after shoulder surgery  . Sleep apnea    not on cpap    Past Surgical History:  Procedure Laterality Date  . ABDOMINAL HYSTERECTOMY    . APPENDECTOMY     came out with Hysterectomy  . CARDIAC CATHETERIZATION  07/23/2009   EF 60%  . CARDIAC CATHETERIZATION  10/11/2008  . CARDIAC CATHETERIZATION  03/01/2007   EF 75-80%  . CARDIOVASCULAR STRESS TEST  11/15/2007   EF 60%  . COLONOSCOPY    . CORONARY ARTERY BYPASS GRAFT     SEVERELY DISEASED SAPHENOUS VEIN GRAFT TO THE RIGHT CORONARY ARTERY BUT WITH FAIRLY WELL PRESERVED FLOW TO THE DISTAL RIGHT  CORONARY ARTERY FROM THE NATIVE CIRCULATION-RESTART  CATH IN JUNE 2000, REVEALS MILD/MODERATE  CAD WITH GOOD FLOW DOWN HER LAD  . ESOPHAGOGASTRODUODENOSCOPY    . EYE SURGERY     bilateral cataract surgery with lens implant  . LEFT HEART CATH AND CORS/GRAFTS ANGIOGRAPHY N/A 12/14/2016   Procedure: LEFT HEART CATH AND CORS/GRAFTS ANGIOGRAPHY;  Surgeon: Jettie Booze, MD;  Location: [facility];  Service: Cardiovascular;  Laterality: N/A;  . lense removal Left   . POLYPECTOMY    . ROTATOR CUFF REPAIR     right and left  . TONSILLECTOMY     age 78  . 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 tumor removed kidney    . UPPER GASTROINTESTINAL ENDOSCOPY    . US ECHOCARDIOGRAPHY  03/08/2008   EF 55-60%    There were no vitals filed for this visit.  Subjective Assessment - 12/26/18 1409    Subjective  Pt reports no new problems since previous visit - doing fine    Patient Stated Goals  To continue to work on balance and  prevent falls - "dangerous at my age"    Currently in Pain?  No/denies                       [facility] Adult PT Treatment/Exercise - 12/27/18 0001      Ambulation/Gait   Curb  5: Supervision    Curb Details (indicate cue type and reason)  cues for stance leg foot placement during descension    Gait Comments  walking and tossing ball (straight ahead) 30' x 2 reps           Balance Exercises - 12/27/18 2047      Balance Exercises: Standing   Standing Eyes Opened  Wide ([place]);Head turns;Foam/compliant surface;5 reps    Standing Eyes Closed  Wide ([place]);Head turns;Foam/compliant surface;5 reps    Rockerboard  Anterior/posterior;Head turns;EO;EC;Intermittent UE support;10 reps    Gait with Head Turns  Forward    Step Over Hurdles / Cones  stepping over balance beam 10 reps each leg with UE support prn    Other  Standing Exercises  marching forward inside // bars 10' x 2 reps with horizontal head turns           PT Short Term Goals - 12/27/18 2053      PT SHORT TERM GOAL #1   Title  Pt will demonstrate ability to perform initial HEP MOD I    Baseline  met 12-05-18    Time  4    Period  Weeks    Status  Achieved    Target Date  12/07/18      PT SHORT TERM GOAL #2   Title  Pt will participate in further assessment of gait/balance and falls risk with FGA and SOT assessments    Baseline  FGA=18/30    Time  4    Period  Weeks    Status  On-going    Target Date  12/07/18      PT SHORT TERM GOAL #3   Title  Pt will report 25% reduction in episodes of dizziness on a weekly basis    Baseline  pt reports dizziness continues to vary in intensity - reports overall no reduction - 12-07-18    Time  4    Period  Weeks    Status  Not Met    Target Date  12/07/18      PT SHORT TERM GOAL #4   Title  Pt will report decreased motion sensitivity to head turns, sit <> R sidelying and turning quickly in standing to 1/5    Baseline  pt continues to c/o dizziness with head turns; 12-07-18    Time  4    Period  Weeks    Status  On-going    Target Date  12/07/18        PT Long Term Goals - 12/27/18 2053      PT LONG TERM GOAL #1   Title  Pt will demonstrate ability to perform final HEP independently    Time  8    Period  Weeks    Status  New      PT LONG TERM GOAL #2   Title  Pt will improve FGA by 4 points to indicate decreased falls risk    Baseline  18/30    Time  8    Period  Weeks    Status  New      PT LONG TERM GOAL #3   Title  Pt will improve  SOT by 8 points to [facility] for age group to indicate improved use of vestibular/visual/sensory system for balance    Baseline  TBD    Time  8    Period  Weeks    Status  New      PT LONG TERM GOAL #4   Title  Pt will report decreased motion sensitivity to head turns, body turns and sit <> sidelying to 0/5    Time  8    Period  Weeks     Status  New      PT LONG TERM GOAL #5   Title  Pt will demonstrate improvement in use of VOR as indicated by ability to perform VOR x 1 viewing in standing >2 minutes with mild onset of symptoms    Time  8    Period  Weeks    Status  New      PT LONG TERM GOAL #6   Title  Pt will report 50% reduction in dizziness episodes on a weekly basis    Time  8    Period  Weeks    Status  New            Plan - 12/27/18 2051    Clinical Impression Statement  Pt tolerated exercises better today with no c/o nausea as she had in previous session; pt also had less c/o vertigo with vestibular exercises with head turns.  Pt is progressing towards goals.    Personal Factors and Comorbidities  Age;Comorbidity 3+;Past/Current Experience    PT Next Visit Plan  balance and gait training with head turns       Patient will benefit from skilled therapeutic intervention in order to improve the following deficits and impairments:     Visit Diagnosis: Unsteadiness on feet  Other abnormalities of gait and mobility  Dizziness and giddiness     Problem List Patient Active Problem List   Diagnosis Date Noted  . Chest pain at rest 08/24/2018  . Arthralgia 08/01/2017  . COPD with asthma ([place]) 05/04/2017  . Angina pectoris ([place]) 01/05/2017  . Insomnia 11/23/2016  . Chronic nonintractable headache 11/23/2016  . Pancreatic insufficiency 10/28/2016  . Carotid artery disease ([place]) 05/19/2016  . Fatigue 05/19/2016  . Anemia 03/27/2016  . Acute bronchitis with COPD ([place]) 12/05/2015  . Type 1 diabetes mellitus ([place]) 07/21/2015  . B12 deficiency 03/31/2015  . OSA (obstructive sleep apnea) with hypoxia 11/20/2014  . Fecal incontinence 08/28/2014  . 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 Falls 08/28/2014  . Neck pain of over 3 months duration 08/28/2014  . H/O total knee replacement 02/20/2014  . Obesity (BMI 30-39.9) 11/12/2013  . Chronic diastolic CHF (congestive heart failure) ([place]) 11/17/2012   . Meniscus, lateral, anterior horn derangement 11/10/2011  . Medial meniscus, posterior horn derangement 11/10/2011  . Osteoarthritis of right knee 11/10/2011  . Vertigo 10/05/2011  . HTN (hypertension), benign 10/05/2011  . CAD (coronary artery disease) 08/27/2010  . MUSCLE CRAMPS 12/29/2009  . History of myocardial infarction 11/19/2009  . Extrinsic asthma 11/19/2009  . GERD 11/19/2009  . Cough 11/19/2009  . Hyperlipidemia 11/18/2009    Alda Lea, PT 12/27/2018, 8:54 PM  [facility] [address] [place], [place], [address] Phone: [phone]   Fax:  [phone]  Name: [name] MRN: [id number] Date of Birth: 10-30-1940

## 2018-12-28 ENCOUNTER — Other Ambulatory Visit: Payer: Self-pay

## 2018-12-28 ENCOUNTER — Ambulatory Visit: Payer: Medicare Other | Admitting: Physical Therapy

## 2018-12-28 DIAGNOSIS — R2689 Other abnormalities of gait and mobility: Secondary | ICD-10-CM | POA: Diagnosis not present

## 2018-12-28 DIAGNOSIS — R42 Dizziness and giddiness: Secondary | ICD-10-CM

## 2018-12-28 DIAGNOSIS — R2681 Unsteadiness on feet: Secondary | ICD-10-CM

## 2018-12-29 ENCOUNTER — Encounter: Payer: Self-pay | Admitting: Physical Therapy

## 2018-12-29 NOTE — Therapy (Signed)
[facility] [address] [place], [place], [address] Phone: [phone]   Fax:  [phone]  Physical Therapy Treatment & 10th visit progress note  Patient Details  Name: [name] MRN: [id number] Date of Birth: 1940-08-25 Referring Provider (PT): Binnie Rail, MD   Encounter Date: 12/28/2018  PT End of Session - 12/29/18 2030    Visit Number  10    Number of Visits  17    Date for PT Re-Evaluation  01/06/19    Authorization Type  [employer] Medicare - 10th visit PN    PT Start Time  1446    PT Stop Time  1534    PT Time Calculation (min)  48 min    Activity Tolerance  Patient tolerated treatment well    Behavior During Therapy  [facility] for tasks assessed/performed       Past Medical History:  Diagnosis Date  . Adenomatous colon polyp   . Allergy   . Anxiety   . Asthma   . Chest pain   . Chronic diastolic CHF (congestive heart failure) ([place])   . COPD (chronic obstructive pulmonary disease) ([place])    pt is unsure if has been officially diagnosed  . Coronary artery disease    CABG '09- cathed 12/09, 9/10, 6/11, 3/14 and 12/13/16- medical Rx  . Diabetes mellitus   . GERD (gastroesophageal reflux disease)   . Hiatal hernia   . Hyperlipidemia   . Hypertension   . Myocardial infarction ([place]) 2009  . PONV (postoperative nausea and vomiting)   . Schatzki's ring   . Shoulder injury    resolved after shoulder surgery  . Sleep apnea    not on cpap    Past Surgical History:  Procedure Laterality Date  . ABDOMINAL HYSTERECTOMY    . APPENDECTOMY     came out with Hysterectomy  . CARDIAC CATHETERIZATION  07/23/2009   EF 60%  . CARDIAC CATHETERIZATION  10/11/2008  . CARDIAC CATHETERIZATION  03/01/2007   EF 75-80%  . CARDIOVASCULAR STRESS TEST  11/15/2007   EF 60%  . COLONOSCOPY    . CORONARY ARTERY BYPASS GRAFT     SEVERELY DISEASED SAPHENOUS VEIN GRAFT TO THE RIGHT CORONARY ARTERY BUT WITH FAIRLY WELL PRESERVED FLOW  TO THE DISTAL RIGHT CORONARY ARTERY FROM THE NATIVE CIRCULATION-RESTART  CATH IN JUNE 2000, REVEALS MILD/MODERATE  CAD WITH GOOD FLOW DOWN HER LAD  . ESOPHAGOGASTRODUODENOSCOPY    . EYE SURGERY     bilateral cataract surgery with lens implant  . LEFT HEART CATH AND CORS/GRAFTS ANGIOGRAPHY N/A 12/14/2016   Procedure: LEFT HEART CATH AND CORS/GRAFTS ANGIOGRAPHY;  Surgeon: Jettie Booze, MD;  Location: [facility];  Service: Cardiovascular;  Laterality: N/A;  . lense removal Left   . POLYPECTOMY    . ROTATOR CUFF REPAIR     right and left  . TONSILLECTOMY     age 36  . 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 tumor removed kidney    . UPPER GASTROINTESTINAL ENDOSCOPY    . US ECHOCARDIOGRAPHY  03/08/2008   EF 55-60%    There were no vitals filed for this visit.  Subjective Assessment - 12/28/18 1455    Subjective  Pt reports she walked alot and "kind of overdid it" - feels some Rt knee soreness today  Patient Stated Goals  To continue to work on balance and prevent falls - "dangerous at my age"    Currently in Pain?  Yes    Pain Score  6     Pain Location  Knee    Pain Orientation  Right    Pain Descriptors / Indicators  Aching;Discomfort    Pain Type  Chronic pain                            Balance Exercises - 12/29/18 2028      Balance Exercises: Standing   Standing Eyes Opened  Wide (BOA);Head turns;Foam/compliant surface;5 reps    Standing Eyes Closed  Wide (BOA);Head turns;Foam/compliant surface;5 reps    Rockerboard  Anterior/posterior;Head turns;EO;EC;Intermittent UE support;10 reps    Gait with Head Turns  Forward    Step Over Hurdles / Cones  stepping over balance beam 10 reps each leg with UE support prn    Other Standing Exercises  marching on incline and on decline 10 reps each with CGA to min  assist           PT Short Term Goals - 12/29/18 2031      PT SHORT TERM GOAL #1   Title  Pt will demonstrate ability to perform initial HEP MOD I    Baseline  met 12-05-18    Time  4    Period  Weeks    Status  Achieved    Target Date  12/07/18      PT SHORT TERM GOAL #2   Title  Pt will participate in further assessment of gait/balance and falls risk with FGA and SOT assessments    Baseline  FGA=18/30    Time  4    Period  Weeks    Status  On-going    Target Date  12/07/18      PT SHORT TERM GOAL #3   Title  Pt will report 25% reduction in episodes of dizziness on a weekly basis    Baseline  pt reports dizziness continues to vary in intensity - reports overall no reduction - 12-07-18    Time  4    Period  Weeks    Status  Not Met    Target Date  12/07/18      PT SHORT TERM GOAL #4   Title  Pt will report decreased motion sensitivity to head turns, sit <> R sidelying and turning quickly in standing to 1/5    Baseline  pt continues to c/o dizziness with head turns; 12-07-18    Time  4    Period  Weeks    Status  On-going    Target Date  12/07/18        PT Long Term Goals - 12/29/18 2032      PT LONG TERM GOAL #1   Title  Pt will demonstrate ability to perform final HEP independently    Time  8    Period  Weeks    Status  New      PT LONG TERM GOAL #2   Title  Pt will improve FGA by 4 points to indicate decreased falls risk    Baseline  18/30    Time  8    Period  Weeks    Status  New      PT LONG TERM GOAL #3   Title  Pt will improve SOT by 8 points to [facility] for age  group to indicate improved use of vestibular/visual/sensory system for balance    Baseline  TBD    Time  8    Period  Weeks    Status  New      PT LONG TERM GOAL #4   Title  Pt will report decreased motion sensitivity to head turns, body turns and sit <> sidelying to 0/5    Time  8    Period  Weeks    Status  New      PT LONG TERM GOAL #5   Title  Pt will demonstrate improvement in use  of VOR as indicated by ability to perform VOR x 1 viewing in standing >2 minutes with mild onset of symptoms    Time  8    Period  Weeks    Status  New      PT LONG TERM GOAL #6   Title  Pt will report 50% reduction in dizziness episodes on a weekly basis    Time  8    Period  Weeks    Status  New            Plan - 12/29/18 2033    Clinical Impression Statement  This 10th visit progress note covers dates 11-06-18 - 12-28-18:  pt has met STG #1 with remaining goals #2-5 not fully met but are ongoing.  Pt demonstrates improvement in balance and reports less vertigo with vestibular activities with head turns; pt does report more dizziness after completing vestibular exercises with eyes closed.  Pt is progressing well towards LTG's.    Personal Factors and Comorbidities  Age;Comorbidity 3+;Past/Current Experience    PT Treatment/Interventions  ADLs/Self Care Home Management;Canalith Repostioning;Cryotherapy;Moist Heat;Gait training;Stair training;Functional mobility training;Therapeutic activities;Therapeutic exercise;Balance training;Neuromuscular re-education;Patient/family education;Manual techniques;Passive range of motion;Dry needling;Vestibular;Visual/perceptual remediation/compensation    PT Next Visit Plan  balance and gait training with head turns       Patient will benefit from skilled therapeutic intervention in order to improve the following deficits and impairments:     Visit Diagnosis: Unsteadiness on feet  Other abnormalities of gait and mobility  Dizziness and giddiness     Problem List Patient Active Problem List   Diagnosis Date Noted  . Chest pain at rest 08/24/2018  . Arthralgia 08/01/2017  . COPD with asthma ([place]) 05/04/2017  . Angina pectoris ([place]) 01/05/2017  . Insomnia 11/23/2016  . Chronic nonintractable headache 11/23/2016  . Pancreatic insufficiency 10/28/2016  . Carotid artery disease ([place]) 05/19/2016  . Fatigue 05/19/2016  . Anemia 03/27/2016   . Acute bronchitis with COPD ([place]) 12/05/2015  . Type 1 diabetes mellitus ([place]) 07/21/2015  . B12 deficiency 03/31/2015  . OSA (obstructive sleep apnea) with hypoxia 11/20/2014  . Fecal incontinence 08/28/2014  . 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 Falls 08/28/2014  . Neck pain of over 3 months duration 08/28/2014  . H/O total knee replacement 02/20/2014  . Obesity (BMI 30-39.9) 11/12/2013  . Chronic diastolic CHF (congestive heart failure) ([place]) 11/17/2012  . Meniscus, lateral, anterior horn derangement 11/10/2011  . Medial meniscus, posterior horn derangement 11/10/2011  . Osteoarthritis of right knee 11/10/2011  . Vertigo 10/05/2011  . HTN (hypertension), benign 10/05/2011  . CAD (coronary artery disease) 08/27/2010  . MUSCLE CRAMPS 12/29/2009  . History of myocardial infarction 11/19/2009  . Extrinsic asthma 11/19/2009  . GERD 11/19/2009  . Cough 11/19/2009  . Hyperlipidemia 11/18/2009    DildayJenness Corner, PT 12/29/2018, 8:38 PM  [facility] [phone]  [place], [place], [address] Phone: [phone]   Fax:  [phone]  Name: [name] MRN: [id number] Date of Birth: [birth year]

## 2019-01-02 ENCOUNTER — Ambulatory Visit: Payer: Medicare Other | Admitting: Physical Therapy

## 2019-01-02 ENCOUNTER — Other Ambulatory Visit: Payer: Self-pay

## 2019-01-02 DIAGNOSIS — R2681 Unsteadiness on feet: Secondary | ICD-10-CM

## 2019-01-02 DIAGNOSIS — R2689 Other abnormalities of gait and mobility: Secondary | ICD-10-CM | POA: Diagnosis not present

## 2019-01-03 ENCOUNTER — Other Ambulatory Visit: Payer: Self-pay | Admitting: Internal Medicine

## 2019-01-03 ENCOUNTER — Encounter: Payer: Self-pay | Admitting: Physical Therapy

## 2019-01-03 NOTE — Therapy (Signed)
[facility] [address] [place], [place], [address] Phone: [phone]   Fax:  [phone]  Physical Therapy Treatment  Patient Details  Name: [name] MRN: [id number] Date of Birth: 1940-04-02 Referring Provider (PT): Binnie Rail, MD   Encounter Date: 01/02/2019  PT End of Session - 01/03/19 2035    Visit Number  11    Number of Visits  17    Date for PT Re-Evaluation  01/06/19    Authorization Type  [employer] Medicare - 10th visit PN    PT Start Time  1535    PT Stop Time  1620    PT Time Calculation (min)  45 min    Activity Tolerance  Patient tolerated treatment well    Behavior During Therapy  [facility] for tasks assessed/performed       Past Medical History:  Diagnosis Date  . Adenomatous colon polyp   . Allergy   . Anxiety   . Asthma   . Chest pain   . Chronic diastolic CHF (congestive heart failure) ([place])   . COPD (chronic obstructive pulmonary disease) ([place])    pt is unsure if has been officially diagnosed  . Coronary artery disease    CABG '09- cathed 12/09, 9/10, 6/11, 3/14 and 12/13/16- medical Rx  . Diabetes mellitus   . GERD (gastroesophageal reflux disease)   . Hiatal hernia   . Hyperlipidemia   . Hypertension   . Myocardial infarction ([place]) 2009  . PONV (postoperative nausea and vomiting)   . Schatzki's ring   . Shoulder injury    resolved after shoulder surgery  . Sleep apnea    not on cpap    Past Surgical History:  Procedure Laterality Date  . ABDOMINAL HYSTERECTOMY    . APPENDECTOMY     came out with Hysterectomy  . CARDIAC CATHETERIZATION  07/23/2009   EF 60%  . CARDIAC CATHETERIZATION  10/11/2008  . CARDIAC CATHETERIZATION  03/01/2007   EF 75-80%  . CARDIOVASCULAR STRESS TEST  11/15/2007   EF 60%  . COLONOSCOPY    . CORONARY ARTERY BYPASS GRAFT     SEVERELY DISEASED SAPHENOUS VEIN GRAFT TO THE RIGHT CORONARY ARTERY BUT WITH FAIRLY WELL PRESERVED FLOW TO THE DISTAL RIGHT  CORONARY ARTERY FROM THE NATIVE CIRCULATION-RESTART  CATH IN JUNE 2000, REVEALS MILD/MODERATE  CAD WITH GOOD FLOW DOWN HER LAD  . ESOPHAGOGASTRODUODENOSCOPY    . EYE SURGERY     bilateral cataract surgery with lens implant  . LEFT HEART CATH AND CORS/GRAFTS ANGIOGRAPHY N/A 12/14/2016   Procedure: LEFT HEART CATH AND CORS/GRAFTS ANGIOGRAPHY;  Surgeon: Jettie Booze, MD;  Location: [facility];  Service: Cardiovascular;  Laterality: N/A;  . lense removal Left   . POLYPECTOMY    . ROTATOR CUFF REPAIR     right and left  . TONSILLECTOMY     age 31  . 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 tumor removed kidney    . UPPER GASTROINTESTINAL ENDOSCOPY    . US ECHOCARDIOGRAPHY  03/08/2008   EF 55-60%    There were no vitals filed for this visit.  Subjective Assessment - 01/03/19 2030    Subjective  Pt reports she had dizziness yesterday which lasted for about 10" - had to sit down and let it pass; states she does not know  what causes it to suddenly occur    Patient Stated Goals  To continue to work on balance and prevent falls - "dangerous at my age"    Currently in Pain?  No/denies                         Neuro Re-ed:  Pt performed gaze stabilization exercise - amb. Making circles clockwise and counterclockwise with ball approx 30' - 10' x 2 reps each  Pt performed amb. Tossing ball 25' x 2 reps with CGA               Balance Exercises - 01/03/19 2032      Balance Exercises: Standing   Standing Eyes Opened  Wide (BOA);Head turns;Foam/compliant surface;5 reps    Standing Eyes Closed  Wide (BOA);Head turns;Foam/compliant surface;5 reps    Rockerboard  Anterior/posterior;Head turns;EO;EC;Intermittent UE support;10 reps    Gait with Head Turns  Forward;1 rep   25'    Step Over Hurdles / Cones  stepping  over balance beam 10 reps each leg with UE support prn    Other Standing Exercises  marching on incline and on decline 10 reps each with CGA to min assist           PT Short Term Goals - 01/03/19 2039      PT SHORT TERM GOAL #1   Title  Pt will demonstrate ability to perform initial HEP MOD I    Baseline  met 12-05-18    Time  4    Period  Weeks    Status  Achieved    Target Date  12/07/18      PT SHORT TERM GOAL #2   Title  Pt will participate in further assessment of gait/balance and falls risk with FGA and SOT assessments    Baseline  FGA=18/30    Time  4    Period  Weeks    Status  On-going    Target Date  12/07/18      PT SHORT TERM GOAL #3   Title  Pt will report 25% reduction in episodes of dizziness on a weekly basis    Baseline  pt reports dizziness continues to vary in intensity - reports overall no reduction - 12-07-18    Time  4    Period  Weeks    Status  Not Met    Target Date  12/07/18      PT SHORT TERM GOAL #4   Title  Pt will report decreased motion sensitivity to head turns, sit <> R sidelying and turning quickly in standing to 1/5    Baseline  pt continues to c/o dizziness with head turns; 12-07-18    Time  4    Period  Weeks    Status  On-going    Target Date  12/07/18        PT Long Term Goals - 01/03/19 2039      PT LONG TERM GOAL #1   Title  Pt will demonstrate ability to perform final HEP independently    Time  8    Period  Weeks    Status  New      PT LONG TERM GOAL #2   Title  Pt will improve FGA by 4 points to indicate decreased falls risk    Baseline  18/30    Time  8    Period  Weeks    Status  New  PT LONG TERM GOAL #3   Title  Pt will improve SOT by 8 points to [facility] for age group to indicate improved use of vestibular/visual/sensory system for balance    Baseline  TBD    Time  8    Period  Weeks    Status  New      PT LONG TERM GOAL #4   Title  Pt will report decreased motion sensitivity to head turns, body turns  and sit <> sidelying to 0/5    Time  8    Period  Weeks    Status  New      PT LONG TERM GOAL #5   Title  Pt will demonstrate improvement in use of VOR as indicated by ability to perform VOR x 1 viewing in standing >2 minutes with mild onset of symptoms    Time  8    Period  Weeks    Status  New      PT LONG TERM GOAL #6   Title  Pt will report 50% reduction in dizziness episodes on a weekly basis    Time  8    Period  Weeks    Status  New            Plan - 01/03/19 2035    Clinical Impression Statement  Pt became slightly diaphoretic with mild c/o nausea with activities with EC and also with activities with EO with head turns, standing on pillows - due to vestibular hypofunction.  Pt reports she has to allow "my eyes to catch up with my head".  Pt did well with balance exercises performed on flat non-compliant surface with slow head turns.  Pt is progressing towards goals.    Personal Factors and Comorbidities  Age;Comorbidity 3+;Past/Current Experience    PT Frequency  2x / week    PT Duration  8 weeks    PT Treatment/Interventions  ADLs/Self Care Home Management;Canalith Repostioning;Cryotherapy;Moist Heat;Gait training;Stair training;Functional mobility training;Therapeutic activities;Therapeutic exercise;Balance training;Neuromuscular re-education;Patient/family education;Manual techniques;Passive range of motion;Dry needling;Vestibular;Visual/perceptual remediation/compensation    PT Next Visit Plan  balance and gait training with head turns       Patient will benefit from skilled therapeutic intervention in order to improve the following deficits and impairments:     Visit Diagnosis: Unsteadiness on feet  Other abnormalities of gait and mobility     Problem List Patient Active Problem List   Diagnosis Date Noted  . Chest pain at rest 08/24/2018  . Arthralgia 08/01/2017  . COPD with asthma ([place]) 05/04/2017  . Angina pectoris ([place]) 01/05/2017  . Insomnia  11/23/2016  . Chronic nonintractable headache 11/23/2016  . Pancreatic insufficiency 10/28/2016  . Carotid artery disease ([place]) 05/19/2016  . Fatigue 05/19/2016  . Anemia 03/27/2016  . Acute bronchitis with COPD ([place]) 12/05/2015  . Type 1 diabetes mellitus ([place]) 07/21/2015  . B12 deficiency 03/31/2015  . OSA (obstructive sleep apnea) with hypoxia 11/20/2014  . Fecal incontinence 08/28/2014  . 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 Falls 08/28/2014  . Neck pain of over 3 months duration 08/28/2014  . H/O total knee replacement 02/20/2014  . Obesity (BMI 30-39.9) 11/12/2013  . Chronic diastolic CHF (congestive heart failure) ([place]) 11/17/2012  . Meniscus, lateral, anterior horn derangement 11/10/2011  . Medial meniscus, posterior horn derangement 11/10/2011  . Osteoarthritis of right knee 11/10/2011  . Vertigo 10/05/2011  . HTN (hypertension), benign 10/05/2011  . CAD (coronary artery disease) 08/27/2010  . MUSCLE CRAMPS 12/29/2009  . History of  myocardial infarction 11/19/2009  . Extrinsic asthma 11/19/2009  . GERD 11/19/2009  . Cough 11/19/2009  . Hyperlipidemia 11/18/2009    Alda Lea, PT 01/03/2019, 8:42 PM  [facility] [address] [place], [place], [address] Phone: [phone]   Fax:  [phone]  Name: KRITIKA STUKES MRN: [id number] Date of Birth: [birth year]

## 2019-01-08 ENCOUNTER — Emergency Department (HOSPITAL_COMMUNITY): Payer: Medicare Other

## 2019-01-08 ENCOUNTER — Other Ambulatory Visit: Payer: Self-pay

## 2019-01-08 ENCOUNTER — Encounter (HOSPITAL_COMMUNITY): Payer: Self-pay | Admitting: Emergency Medicine

## 2019-01-08 ENCOUNTER — Ambulatory Visit: Payer: Self-pay

## 2019-01-08 ENCOUNTER — Emergency Department (HOSPITAL_COMMUNITY)
Admission: EM | Admit: 2019-01-08 | Discharge: 2019-01-08 | Disposition: A | Discharge status: Home / Self Care | Payer: Medicare Other | Attending: Emergency Medicine | Admitting: Emergency Medicine

## 2019-01-08 ENCOUNTER — Telehealth: Payer: Self-pay | Admitting: Internal Medicine

## 2019-01-08 DIAGNOSIS — J449 Chronic obstructive pulmonary disease, unspecified: Secondary | ICD-10-CM | POA: Diagnosis not present

## 2019-01-08 DIAGNOSIS — I5032 Chronic diastolic (congestive) heart failure: Secondary | ICD-10-CM | POA: Insufficient documentation

## 2019-01-08 DIAGNOSIS — E109 Type 1 diabetes mellitus without complications: Secondary | ICD-10-CM | POA: Diagnosis not present

## 2019-01-08 DIAGNOSIS — Z79899 Other long term (current) drug therapy: Secondary | ICD-10-CM | POA: Insufficient documentation

## 2019-01-08 DIAGNOSIS — I110 Hypertensive heart disease with heart failure: Secondary | ICD-10-CM | POA: Insufficient documentation

## 2019-01-08 DIAGNOSIS — I10 Essential (primary) hypertension: Secondary | ICD-10-CM

## 2019-01-08 DIAGNOSIS — Z87891 Personal history of nicotine dependence: Secondary | ICD-10-CM | POA: Diagnosis not present

## 2019-01-08 LAB — BASIC METABOLIC PANEL WITH GFR
Anion gap: 8 (ref 5–15)
BUN: 16 mg/dL (ref 8–23)
CO2: 29 mmol/L (ref 22–32)
Calcium: 9.1 mg/dL (ref 8.9–10.3)
Chloride: 104 mmol/L (ref 98–111)
Creatinine, Ser: 0.69 mg/dL (ref 0.44–1.00)
GFR calc Af Amer: >60 mL/min
GFR calc non Af Amer: >60 mL/min
Glucose, Bld: 186 mg/dL — ABNORMAL HIGH (ref 70–99)
Potassium: 4.6 mmol/L (ref 3.5–5.1)
Sodium: 141 mmol/L (ref 135–145)

## 2019-01-08 LAB — CBC
HCT: 41.7 % (ref 36.0–46.0)
Hemoglobin: 12.5 g/dL (ref 12.0–15.0)
MCH: 25 pg — ABNORMAL LOW (ref 26.0–34.0)
MCHC: 30 g/dL (ref 30.0–36.0)
MCV: 83.2 fL (ref 80.0–100.0)
Platelets: 286 10*3/uL (ref 150–400)
RBC: 5.01 MIL/uL (ref 3.87–5.11)
RDW: 15.5 % (ref 11.5–15.5)
WBC: 9.9 10*3/uL (ref 4.0–10.5)
nRBC: 0 % (ref 0.0–0.2)

## 2019-01-08 NOTE — Discharge Instructions (Addendum)
You need to call your primary doctor soon as possible to discuss your blood pressure and possible medication adjustments.  Return here for any worsening in your condition.

## 2019-01-08 NOTE — Telephone Encounter (Signed)
Incoming call from Patient with a complaint of have elevated blood pressure.  Friday 11/27  Values 154/96   Sat 11/28 160/100 Sun 11/29 160/102       Monday 11/30 152/106  Patient complains of headaches and pain in right arm.   . Onset Friday evening.  Automatic machine.  Denies missing any other medication. Reviewed the protocol with Patient recommended Patient go to either Urgent care or ED  For further evaluation.  Patient voiced understanding.  States she will look for an Urgent care close to her.  Otherwise will   have some one drive her there or to Emergency Room.        Reason for Disposition . [id number] Systolic BP  >= 0000000 OR Diastolic >= [id number] AND [id number] cardiac or neurologic symptoms (e.g., chest pain, difficulty breathing, unsteady gait, blurred vision)  Answer Assessment - Initial Assessment Questions 1. BLOOD PRESSURE: "What is the blood pressure?" "Did you take at least two measurements 5 minutes apart?"     *No Answer* 2. ONSET: "When did you take your blood pressure?"    Friday evening.  3. HOW: "How did you obtain the blood pressure?" (e.g., visiting nurse, automatic home BP monitor)     automatic 4. HISTORY: "Do you have a history of high blood pressure?"     yes 5. MEDICATIONS: "Are you taking any medications for blood pressure?" "Have you missed any doses recently?"    Denies missing any medication 6. OTHER SYMPTOMS: "Do you have any symptoms?" (e.g., headache, chest pain, blurred vision, difficulty breathing, weakness)   Pain in right arm  everyday 7. PREGNANCY: "Is there any chance you are pregnant?" "When was your last menstrual period?"     na  Protocols used: HIGH BLOOD PRESSURE-A-AH

## 2019-01-08 NOTE — ED Triage Notes (Signed)
Pt reports hx of htn, has been running high the last several days with systolic in [id number]. Has not taken meds today. Denies CP or SOB. Pt c/o headache, denies dizziness. NAD at present.

## 2019-01-08 NOTE — ED Notes (Signed)
Pt verbalized understanding of discharge instructions. Pt stated she will follow up with her cardiologist today. Pt had no further questions at this time. Pt's son is coming to pick her up.

## 2019-01-08 NOTE — ED Provider Notes (Signed)
[facility] EMERGENCY DEPARTMENT Provider Note   CSN: [id number] Arrival date & time: 01/08/19  1051     History   Chief Complaint Chief Complaint  Patient presents with  . Hypertension    HPI [name] is a 78 y.o. female.     HPI Patient presents to the emergency department with elevated blood pressure at home.  The patient states that she has noticed her blood pressures been in the 160s.  The patient states that she has been taking her medications as prescribed.  She states she is on blood pressure medications.  Patient states that nothing seems to make her condition better or worse.  Patient states she is had no chest pain, shortness of breath, nausea, vomiting, weakness, dizziness, back pain, numbness, blurred vision, headache, neck pain, incontinence, near-syncope or syncope.  The patient states that she has not noticed anything that makes her condition worse. Past Medical History:  Diagnosis Date  . Adenomatous colon polyp   . Allergy   . Anxiety   . Asthma   . Chest pain   . Chronic diastolic CHF (congestive heart failure) ([place])   . COPD (chronic obstructive pulmonary disease) ([place])    pt is unsure if has been officially diagnosed  . Coronary artery disease    CABG '09- cathed 12/09, 9/10, 6/11, 3/14 and 12/13/16- medical Rx  . Diabetes mellitus   . GERD (gastroesophageal reflux disease)   . Hiatal hernia   . Hyperlipidemia   . Hypertension   . Myocardial infarction ([place]) 2009  . PONV (postoperative nausea and vomiting)   . Schatzki's ring   . Shoulder injury    resolved after shoulder surgery  . Sleep apnea    not on cpap    Patient Active Problem List   Diagnosis Date Noted  . Chest pain at rest 08/24/2018  . Arthralgia 08/01/2017  . COPD with asthma ([place]) 05/04/2017  . Angina pectoris ([place]) 01/05/2017  . Insomnia 11/23/2016  . Chronic nonintractable headache 11/23/2016  . Pancreatic insufficiency 10/28/2016  . Carotid artery  disease ([place]) 05/19/2016  . Fatigue 05/19/2016  . Anemia 03/27/2016  . Acute bronchitis with COPD ([place]) 12/05/2015  . Type 1 diabetes mellitus ([place]) 07/21/2015  . B12 deficiency 03/31/2015  . OSA (obstructive sleep apnea) with hypoxia 11/20/2014  . Fecal incontinence 08/28/2014  . 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 Falls 08/28/2014  . Neck pain of over 3 months duration 08/28/2014  . H/O total knee replacement 02/20/2014  . Obesity (BMI 30-39.9) 11/12/2013  . Chronic diastolic CHF (congestive heart failure) ([place]) 11/17/2012  . Meniscus, lateral, anterior horn derangement 11/10/2011  . Medial meniscus, posterior horn derangement 11/10/2011  . Osteoarthritis of right knee 11/10/2011  . Vertigo 10/05/2011  . HTN (hypertension), benign 10/05/2011  . CAD (coronary artery disease) 08/27/2010  . MUSCLE CRAMPS 12/29/2009  . History of myocardial infarction 11/19/2009  . Extrinsic asthma 11/19/2009  . GERD 11/19/2009  . Cough 11/19/2009  . Hyperlipidemia 11/18/2009    Past Surgical History:  Procedure Laterality Date  . ABDOMINAL HYSTERECTOMY    . APPENDECTOMY     came out with Hysterectomy  . CARDIAC CATHETERIZATION  07/23/2009   EF 60%  . CARDIAC CATHETERIZATION  10/11/2008  . CARDIAC CATHETERIZATION  03/01/2007   EF 75-80%  . CARDIOVASCULAR STRESS TEST  11/15/2007   EF 60%  . COLONOSCOPY    . CORONARY ARTERY BYPASS GRAFT     SEVERELY DISEASED SAPHENOUS VEIN GRAFT TO  THE RIGHT CORONARY ARTERY BUT WITH FAIRLY WELL PRESERVED FLOW TO THE DISTAL RIGHT CORONARY ARTERY FROM THE NATIVE CIRCULATION-RESTART  CATH IN JUNE 2000, REVEALS MILD/MODERATE  CAD WITH GOOD FLOW DOWN HER LAD  . ESOPHAGOGASTRODUODENOSCOPY    . EYE SURGERY     bilateral cataract surgery with lens implant  . LEFT HEART CATH AND CORS/GRAFTS ANGIOGRAPHY N/A 12/14/2016   Procedure: LEFT HEART CATH AND CORS/GRAFTS ANGIOGRAPHY;  Surgeon: Jettie Booze, MD;  Location: [facility];  Service: Cardiovascular;   Laterality: N/A;  . lense removal Left   . POLYPECTOMY    . ROTATOR CUFF REPAIR     right and left  . TONSILLECTOMY     age 89  . 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 tumor removed kidney    . UPPER GASTROINTESTINAL ENDOSCOPY    . US ECHOCARDIOGRAPHY  03/08/2008   EF 55-60%     OB History   No obstetric history on file.      Home Medications    Prior to Admission medications   Medication Sig Start Date End Date Taking? Authorizing Provider  albuterol (PROVENTIL HFA;VENTOLIN HFA) 108 (90 BASE) MCG/ACT inhaler Inhale 2 puffs into the lungs every 6 (six) hours as needed. For shortness of breath. 03/06/12   Elsie Stain, MD  albuterol (PROVENTIL) (2.5 MG/3ML) 0.083% nebulizer solution USE 1 VIAL IN NEBULIZER EVERY 6 HOURS - and as needed 08/28/18   Juanito Doom, MD  alum & mag hydroxide-simeth (MAALOX/MYLANTA) 200-200-20 MG/5ML suspension Take 30 mLs as needed by mouth for indigestion or heartburn.    [provider]  aspirin EC 81 MG tablet Take 1 tablet (81 mg total) by mouth daily. 12/10/16   Richardson Dopp T, PA-C  baclofen (LIORESAL) 20 MG tablet Take 1 tablet (20 mg total) by mouth 3 (three) times daily. Need office visit for more refills 06/20/18   Binnie Rail, MD  Blood Glucose Monitoring Suppl (ONE TOUCH ULTRA MINI) w/Device KIT 3 (three) times daily. for testing 02/26/15   [provider]  calcium carbonate (TUMS - DOSED IN MG ELEMENTAL CALCIUM) 500 MG chewable tablet Chew 2 tablets daily as needed by mouth for indigestion or heartburn.    [provider]  Cholecalciferol (VITAMIN D) 2000 UNITS tablet Take 2,000 Units by mouth daily.     [provider]  clonazePAM (KLONOPIN) 0.5 MG tablet Take 0.5 mg daily by mouth.    [provider]  CREON 24000-76000 units CPEP TAKE 2 CAPSULES BY  MOUTH 3 TIMES A DAY WITH MEALS (TAK 1 CAPSULE WITH A SNACK) 06/27/17   Ladene Artist, MD  diclofenac sodium (VOLTAREN) 1 % GEL Apply 2 g topically 4 (four) times daily as needed (muscle pain).    [provider]  diltiazem (CARDIZEM CD) 240 MG 24 hr capsule TAKE 1 CAPSULE BY MOUTH EVERY DAY 11/23/18   Nahser, Wonda Cheng, MD  ezetimibe (ZETIA) 10 MG tablet Take 10 mg by mouth daily. 05/12/17   [provider]  famotidine (PEPCID) 40 MG tablet Take 1 tablet (40 mg total) by mouth daily as needed. 04/20/18   Binnie Rail, MD  fexofenadine (ALLEGRA) 180 MG tablet Take 1 tablet (180 mg total) by mouth daily. 10/31/18   Lauraine Rinne, NP  fluticasone (FLONASE) 50 MCG/ACT nasal spray Place 2 sprays  into both nostrils daily. 10/31/18   Lauraine Rinne, NP  Fluticasone-Salmeterol (ADVAIR DISKUS) 250-50 MCG/DOSE AEPB Inhale 1 puff into the lungs 2 (two) times daily. 06/12/18   Fenton Foy, NP  furosemide (LASIX) 40 MG tablet Take 1 tablet (40 mg total) by mouth daily. 03/15/17   Bhagat, Crista Luria, PA  insulin lispro protamine-insulin lispro (HUMALOG 75/25) (75-25) 100 UNIT/ML SUSP Inject 15-22 Units into the skin See admin instructions. Takes 16 units in the morning, 10 units at lunch, and 25  units with supper    [provider]  isosorbide mononitrate (IMDUR) 60 MG 24 hr tablet Take 1.5 tablets (90 mg total) by mouth daily. 12/28/17   Richardson Dopp T, PA-C  lansoprazole (PREVACID) 30 MG capsule TAKE 1 CAPSULE BY MOUTH TWICE A DAY BEFORE A MEAL 01/03/19   Burns, Claudina Lick, MD  loperamide (IMODIUM) 2 MG capsule Take 2 mg by mouth as needed for diarrhea or loose stools.     [provider]  meclizine (ANTIVERT) 25 MG tablet Take 1 tablet (25 mg total) by mouth 2 (two) times daily as needed for dizziness. 09/29/18   Binnie Rail, MD  metoprolol succinate (TOPROL-XL) 100 MG 24 hr tablet Take 1 tablet (100 mg total) by mouth daily. 08/24/18   Nahser, Wonda Cheng, MD  montelukast  (SINGULAIR) 5 MG chewable tablet CHEW 1 TABLET (5 MG TOTAL) BY MOUTH AT BEDTIME. 11/09/18   Mannam, Praveen, MD  Multiple Vitamin (MULTIVITAMIN WITH MINERALS) TABS Take 1 tablet by mouth daily.    [provider]  Nebulizers (COMPRESSOR/NEBULIZER) MISC Use with albuterol 04/24/14   Elsie Stain, MD  nitroGLYCERIN (NITROSTAT) 0.4 MG SL tablet Place 0.4 mg under the tongue every 5 (five) minutes as needed for chest pain.    [provider]  nortriptyline (PAMELOR) 75 MG capsule TAKE 1 CAPSULE (75 MG TOTAL) BY MOUTH AT BEDTIME. 10/23/18   Jaffe, Adam R, DO  ONE TOUCH ULTRA TEST test strip CHECK BLOOD SUGAR TWICE A DAY DX: E11.9 03/26/15   [provider]  [facility] DELICA LANCETS 37C MISC 3 (three) times daily. for testing 02/26/15   [provider]  Spacer/Aero-Holding Chambers (AEROCHAMBER MV) inhaler Use as instructed 08/13/16   Javier Glazier, MD  Tiotropium Bromide Monohydrate (SPIRIVA RESPIMAT) 2.5 MCG/ACT AERS Inhale 2 puffs into the lungs daily. 08/01/18   Lauraine Rinne, NP    Family History Family History  Problem Relation Age of Onset  . Heart disease Maternal Grandfather   . Heart failure Maternal Grandfather   . Diabetes Maternal Grandfather   . Heart attack Father   . Diabetes Mother   . Rheum arthritis Sister   . Emphysema Paternal Uncle   . Esophageal cancer Brother 31       she said he was born with it  . Emphysema Paternal Aunt   . Healthy Child   . Neuropathy Neg Hx   . Multiple sclerosis Neg Hx   . Colon cancer Neg Hx   . Colon polyps Neg Hx   . Rectal cancer Neg Hx   . Stomach cancer Neg Hx     Social History Social History   Tobacco Use  . Smoking status: Former Smoker    Packs/day: 0.50    Years: 21.00    Pack years: 10.50    Types: Cigarettes    Start date: 02/09/1955    Quit date: 02/09/1976    Years since quitting: 42.9  . Smokeless tobacco:  Never Used  Substance Use Topics  . Alcohol use: No    Alcohol/week: 0.0  standard drinks  . Drug use: No     Allergies   Amlodipine, Banana, Co q10 [coenzyme q10], Codeine, Metformin and related, Morphine, Pentazocine, Pravastatin, Repatha [evolocumab], Statins, Sulfa antibiotics, Sulfonamide derivatives, and Tramadol hcl   Review of Systems Review of Systems All other systems negative except as documented in the HPI. All pertinent positives and negatives as reviewed in the HPI.  Physical Exam Updated Vital Signs BP (!) 178/93   Pulse 98   Temp 98.6 F (37 C) (Oral)   Resp 19   Ht '5\' 2"'  (1.575 m)   Wt 85.7 kg   LMP  (LMP Unknown)   SpO2 98%   BMI 34.57 kg/m   Physical Exam Vitals signs and nursing note reviewed.  Constitutional:      General: She is not in acute distress.    Appearance: She is well-developed.  HENT:     Head: Normocephalic and atraumatic.  Eyes:     Pupils: Pupils are equal, round, and reactive to light.  Neck:     Musculoskeletal: Normal range of motion and neck supple.  Cardiovascular:     Rate and Rhythm: Normal rate and regular rhythm.     Heart sounds: Normal heart sounds. No murmur. No friction rub. No gallop.   Pulmonary:     Effort: Pulmonary effort is normal. No respiratory distress.     Breath sounds: Normal breath sounds. No wheezing.  Abdominal:     General: Bowel sounds are normal. There is no distension.     Palpations: Abdomen is soft.     Tenderness: There is no abdominal tenderness.  Skin:    General: Skin is warm and dry.     Capillary Refill: Capillary refill takes less than 2 seconds.     Findings: No erythema or rash.  Neurological:     General: No focal deficit present.     Mental Status: She is alert and oriented to person, place, and time.     Motor: No weakness or abnormal muscle tone.     Coordination: Coordination normal.     Gait: Gait normal.     Deep Tendon Reflexes: Reflexes normal.  Psychiatric:        Behavior: Behavior normal.      ED Treatments / Results  Labs (all labs  ordered are listed, but only abnormal results are displayed) Labs Reviewed  CBC - Abnormal; Notable for the following components:      Result Value   MCH 25.0 (*)    All other components within normal limits  BASIC METABOLIC PANEL - Abnormal; Notable for the following components:   Glucose, Bld 186 (*)    All other components within normal limits    EKG None  Radiology Dg Chest 2 View  Result Date: 01/08/2019 CLINICAL DATA:  Chest pain and hypertension. EXAM: CHEST - 2 VIEW COMPARISON:  01/04/2017 FINDINGS: Artifact overlies the chest. Previous median sternotomy and CABG. Heart size is normal. Chronic aortic atherosclerosis. The pulmonary vascularity is normal. No infiltrate, collapse or effusion. No acute bone finding. IMPRESSION: Previous CABG.  No active disease. Electronically Signed   By: Nelson Chimes M.D.   On: 01/08/2019 13:23    Procedures Procedures (including critical care time)  Medications Ordered in ED Medications - No data to display   Initial Impression / Assessment and Plan / ED Course  I have reviewed the triage vital  signs and the nursing notes.  Pertinent labs & imaging results that were available during my care of the patient were reviewed by me and considered in my medical decision making (see chart for details).       Patient is advised she will need to call her primary doctor for evaluation of her blood pressure medications and further recommendations for helping lower her blood pressure.  Patient is not in hypertensive crisis or urgency.  Patient has no other symptoms other than the elevated blood pressure.  I have advised the patient to return here for any worsening in her condition.  Have asked the patient to only check her blood pressure 1-2 times a day.  Final Clinical Impressions(s) / ED Diagnoses   Final diagnoses:  None    ED Discharge Orders    None       Dalia Heading, PA-C 01/08/19 1447    Davonna Belling, MD 01/09/19  1500

## 2019-01-08 NOTE — Progress Notes (Signed)
  Subjective:    Patient ID: [name], female    DOB: 08/04/1940, 78 y.o.   MRN: [id number]  HPI The patient is here for follow up from the ED.   She went to the ED yesterday for hypertension.  Her BP was in the 160's at home and she was having headaches and right armpit pain.  She was advised to go the ED and she did.  She had been taking her BP medications.  The ED stated that she denied chest pain, SOB, nausea, weakness, dizziness, blurred vision and headaches in the ED, but she was having headache in the right armpit pain.  They did not feel she was in hypertensive crisis or urgency and no changes were made to her BP medications.  When her BP decreased her right armpit pain and headache improved.  She denies having any increase in sodium intake.  She did is unsure why her blood pressure spiked up.  The blood pressure started to become elevated last Thursday.  Last Thursday her BP was high and she did not feel well.  Her BP was 196/100.  Friday her Bp was still elevated ? /106.  Today at PT it was 158/81.  Prior to last Thursday her BP was controlled - she checks it daily.  She feels like her blood pressure is finally starting to come down.  She has not had any chest pain, headaches, palpitations, leg edema or lightheadedness/dizziness.  She has not had recurrence of the armpit pain.  She does have an appointment with cardiology this week.   Medications and allergies reviewed with patient and updated if appropriate.  Patient Active Problem List   Diagnosis Date Noted  . Chest pain at rest 08/24/2018  . Arthralgia 08/01/2017  . COPD with asthma (HCC) 05/04/2017  . Angina pectoris (HCC) 01/05/2017  . Insomnia 11/23/2016  . Chronic nonintractable headache 11/23/2016  . Pancreatic insufficiency 10/28/2016  . Carotid artery disease (HCC) 05/19/2016  . Fatigue 05/19/2016  . Anemia 03/27/2016  . Acute bronchitis with COPD (HCC) 12/05/2015  . Type 1 diabetes mellitus (HCC) 07/21/2015   . B12 deficiency 03/31/2015  . OSA (obstructive sleep apnea) with hypoxia 11/20/2014  . Fecal incontinence 08/28/2014  . 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 Falls 08/28/2014  . Neck pain of over 3 months duration 08/28/2014  . H/O total knee replacement 02/20/2014  . Obesity (BMI 30-39.9) 11/12/2013  . Chronic diastolic CHF (congestive heart failure) (HCC) 11/17/2012  . Meniscus, lateral, anterior horn derangement 11/10/2011  . Medial meniscus, posterior horn derangement 11/10/2011  . Osteoarthritis of right knee 11/10/2011  . Vertigo 10/05/2011  . HTN (hypertension), benign 10/05/2011  . CAD (coronary artery disease) 08/27/2010  . MUSCLE CRAMPS 12/29/2009  . History of myocardial infarction 11/19/2009  . Extrinsic asthma 11/19/2009  . GERD 11/19/2009  . Cough 11/19/2009  . Hyperlipidemia 11/18/2009    Current Outpatient Medications on File Prior to Visit  Medication Sig Dispense Refill  . albuterol (PROVENTIL HFA;VENTOLIN HFA) 108 (90 BASE) MCG/ACT inhaler Inhale 2 puffs into the lungs every 6 (six) hours as needed. For shortness of breath. 1 Inhaler 0  . albuterol (PROVENTIL) (2.5 MG/3ML) 0.083% nebulizer solution USE 1 VIAL IN NEBULIZER EVERY 6 HOURS - and as needed 360 mL 1  . alum & mag hydroxide-simeth (MAALOX/MYLANTA) 200-200-20 MG/5ML suspension Take 30 mLs as needed by mouth for indigestion or heartburn.    . aspirin EC 81 MG tablet Take 1 tablet (81 mg total)   by mouth daily.    . baclofen (LIORESAL) 20 MG tablet Take 1 tablet (20 mg total) by mouth 3 (three) times daily. Need office visit for more refills 90 tablet 0  . Blood Glucose Monitoring Suppl (ONE TOUCH ULTRA MINI) w/Device KIT 3 (three) times daily. for testing  0  . calcium carbonate (TUMS - DOSED IN MG ELEMENTAL CALCIUM) 500 MG chewable tablet Chew 2 tablets daily as needed by mouth for indigestion or heartburn.    . Cholecalciferol (VITAMIN D) 2000 UNITS tablet Take 2,000 Units by mouth daily.     .  clonazePAM (KLONOPIN) 0.5 MG tablet Take 0.5 mg daily by mouth.    . CREON 24000-76000 units CPEP TAKE 2 CAPSULES BY MOUTH 3 TIMES A DAY WITH MEALS (TAK 1 CAPSULE WITH A SNACK) 210 capsule 1  . diclofenac sodium (VOLTAREN) 1 % GEL Apply 2 g topically 4 (four) times daily as needed (muscle pain).    . ezetimibe (ZETIA) 10 MG tablet Take 10 mg by mouth daily.  6  . famotidine (PEPCID) 40 MG tablet Take 1 tablet (40 mg total) by mouth daily as needed. 90 tablet 1  . fexofenadine (ALLEGRA) 180 MG tablet Take 1 tablet (180 mg total) by mouth daily. 30 tablet 6  . fluticasone (FLONASE) 50 MCG/ACT nasal spray Place 2 sprays into both nostrils daily. 16 g 6  . Fluticasone-Salmeterol (ADVAIR DISKUS) 250-50 MCG/DOSE AEPB Inhale 1 puff into the lungs 2 (two) times daily. 60 each 5  . furosemide (LASIX) 40 MG tablet Take 1 tablet (40 mg total) by mouth daily. 90 tablet 3  . insulin lispro protamine-insulin lispro (HUMALOG 75/25) (75-25) 100 UNIT/ML SUSP Inject 15-22 Units into the skin See admin instructions. Takes 16 units in the morning, 10 units at lunch, and 25  units with supper    . isosorbide mononitrate (IMDUR) 60 MG 24 hr tablet Take 1.5 tablets (90 mg total) by mouth daily. 135 tablet 1  . lansoprazole (PREVACID) 30 MG capsule TAKE 1 CAPSULE BY MOUTH TWICE A DAY BEFORE A MEAL 180 capsule 0  . loperamide (IMODIUM) 2 MG capsule Take 2 mg by mouth as needed for diarrhea or loose stools.     . meclizine (ANTIVERT) 25 MG tablet Take 1 tablet (25 mg total) by mouth 2 (two) times daily as needed for dizziness. 30 tablet 2  . metoprolol succinate (TOPROL-XL) 100 MG 24 hr tablet Take 1 tablet (100 mg total) by mouth daily. 30 tablet 11  . montelukast (SINGULAIR) 5 MG chewable tablet CHEW 1 TABLET (5 MG TOTAL) BY MOUTH AT BEDTIME. 90 tablet 1  . Multiple Vitamin (MULTIVITAMIN WITH MINERALS) TABS Take 1 tablet by mouth daily.    . Nebulizers (COMPRESSOR/NEBULIZER) MISC Use with albuterol 1 each 0  .  nitroGLYCERIN (NITROSTAT) 0.4 MG SL tablet Place 0.4 mg under the tongue every 5 (five) minutes as needed for chest pain.    . nortriptyline (PAMELOR) 75 MG capsule TAKE 1 CAPSULE (75 MG TOTAL) BY MOUTH AT BEDTIME. 90 capsule 2  . ONE TOUCH ULTRA TEST test strip CHECK BLOOD SUGAR TWICE A DAY DX: E11.9  3  . ONETOUCH DELICA LANCETS 33G MISC 3 (three) times daily. for testing  0  . Spacer/Aero-Holding Chambers (AEROCHAMBER MV) inhaler Use as instructed 1 each 0  . Tiotropium Bromide Monohydrate (SPIRIVA RESPIMAT) 2.5 MCG/ACT AERS Inhale 2 puffs into the lungs daily. 4 g 6   Current Facility-Administered Medications on File Prior to Visit  Medication   Dose Route Frequency Provider Last Rate Last Dose  . cyanocobalamin ((VITAMIN B-12)) injection 1,000 mcg  1,000 mcg Intramuscular Q30 days Binnie Rail, MD   1,000 mcg at 03/26/16 1658    Past Medical History:  Diagnosis Date  . Adenomatous colon polyp   . Allergy   . Anxiety   . Asthma   . Chest pain   . Chronic diastolic CHF (congestive heart failure) ([place])   . COPD (chronic obstructive pulmonary disease) ([place])    pt is unsure if has been officially diagnosed  . Coronary artery disease    CABG '09- cathed 12/09, 9/10, 6/11, 3/14 and 12/13/16- medical Rx  . Diabetes mellitus   . GERD (gastroesophageal reflux disease)   . Hiatal hernia   . Hyperlipidemia   . Hypertension   . Myocardial infarction ([place]) 2009  . PONV (postoperative nausea and vomiting)   . Schatzki's ring   . Shoulder injury    resolved after shoulder surgery  . Sleep apnea    not on cpap    Past Surgical History:  Procedure Laterality Date  . ABDOMINAL HYSTERECTOMY    . APPENDECTOMY     came out with Hysterectomy  . CARDIAC CATHETERIZATION  07/23/2009   EF 60%  . CARDIAC CATHETERIZATION  10/11/2008  . CARDIAC CATHETERIZATION  03/01/2007   EF 75-80%  . CARDIOVASCULAR STRESS TEST  11/15/2007   EF 60%  . COLONOSCOPY    . CORONARY ARTERY BYPASS GRAFT     SEVERELY  DISEASED SAPHENOUS VEIN GRAFT TO THE RIGHT CORONARY ARTERY BUT WITH FAIRLY WELL PRESERVED FLOW TO THE DISTAL RIGHT CORONARY ARTERY FROM THE NATIVE CIRCULATION-RESTART  CATH IN JUNE 2000, REVEALS MILD/MODERATE  CAD WITH GOOD FLOW DOWN HER LAD  . ESOPHAGOGASTRODUODENOSCOPY    . EYE SURGERY     bilateral cataract surgery with lens implant  . LEFT HEART CATH AND CORS/GRAFTS ANGIOGRAPHY N/A 12/14/2016   Procedure: LEFT HEART CATH AND CORS/GRAFTS ANGIOGRAPHY;  Surgeon: Jettie Booze, MD;  Location: [facility];  Service: Cardiovascular;  Laterality: N/A;  . lense removal Left   . POLYPECTOMY    . ROTATOR CUFF REPAIR     right and left  . TONSILLECTOMY     age 27  . 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 tumor removed kidney    . UPPER GASTROINTESTINAL ENDOSCOPY    . US ECHOCARDIOGRAPHY  03/08/2008   EF 55-60%    Social History   Socioeconomic History  . Marital status: Widowed    Spouse name: Not on file  . Number of children: 4  . Years of education: Designer, jewellery  . Highest education level: Doctorate  Occupational History  . Occupation: Retired  Scientific laboratory technician  . Financial resource strain: Very hard  . Food insecurity    Worry: Sometimes true    Inability: Sometimes true  . Transportation needs    Medical: No    Non-medical: No  Tobacco Use  . Smoking status: Former Smoker    Packs/day: 0.50    Years: 21.00    Pack years: 10.50    Types: Cigarettes    Start date: 02/09/1955    Quit date: 02/09/1976    Years since quitting: 42.9  . Smokeless tobacco: Never Used  Substance and Sexual Activity  . Alcohol use: No    Alcohol/week: 0.0 standard drinks  .  Drug use: No  . Sexual activity: Never  Lifestyle  . Physical activity    Days per week: 3 days    Minutes per session: 40 min  . Stress: Only a little  Relationships  .  Social connections    Talks on phone: More than three times a week    Gets together: More than three times a week    Attends religious service: More than 4 times per year    Active member of club or organization: Yes    Attends meetings of clubs or organizations: More than 4 times per year    Relationship status: Widowed  Other Topics Concern  . Not on file  Social History Narrative   Lives alone.  One story home.  Has 4 children.  Education: doctorate in theology.    Caffeine use: Drinks 1 cup coffee/day      Originally from [place]. Previously has lived in [place]. Prior travel to [place], [place], [place], [place], [place], [place], [place], & [place]. Previously worked in [employer] She has a dog currently. No bird, mold, or hot tub exposure. She also pastors a church.     Family History  Problem Relation Age of Onset  . Heart disease Maternal Grandfather   . Heart failure Maternal Grandfather   . Diabetes Maternal Grandfather   . Heart attack Father   . Diabetes Mother   . Rheum arthritis Sister   . Emphysema Paternal Uncle   . Esophageal cancer Brother 41       she said he was born with it  . Emphysema Paternal Aunt   . Healthy Child   . Neuropathy Neg Hx   . Multiple sclerosis Neg Hx   . Colon cancer Neg Hx   . Colon polyps Neg Hx   . Rectal cancer Neg Hx   . Stomach cancer Neg Hx     Review of Systems  Constitutional: Negative for chills and fever.  Respiratory: Negative for shortness of breath.   Cardiovascular: Negative for chest pain, palpitations and leg swelling.  Neurological: Negative for light-headedness and headaches (None since the emergency room).       Objective:   Vitals:   01/09/19 1528  BP: (!) 144/78  Resp: 16  Temp: 98.4 F (36.9 C)   BP Readings from Last 3 Encounters:  01/09/19 (!) 144/78  01/09/19 134/86  01/08/19 (!) 178/93   Wt Readings from Last 3 Encounters:  01/09/19 204 lb (92.5 kg)  01/08/19 189 lb (85.7 kg)  12/21/18 189 lb (85.7  kg)   Body mass index is 37.31 kg/m.   Physical Exam    Constitutional: Appears well-developed and well-nourished. No distress.  HENT:  Head: Normocephalic and atraumatic.  Neck: Neck supple. No tracheal deviation present. No thyromegaly present.  No cervical lymphadenopathy Cardiovascular: Normal rate, regular rhythm and normal heart sounds.   No murmur heard. No carotid bruit .  No edema Pulmonary/Chest: Effort normal and breath sounds normal. No respiratory distress. No has no wheezes. No rales.  Skin: Skin is warm and dry. Not diaphoretic.  Psychiatric: Normal mood and affect. Behavior is normal.      Assessment & Plan:    See Problem List for Assessment and Plan of chronic medical problems.    This visit occurred during the SARS-CoV-2 public health emergency.  Safety protocols were in place, including screening questions prior to the visit, additional usage of staff PPE, and extensive cleaning of exam room while observing appropriate contact  time as indicated for disinfecting solutions.

## 2019-01-08 NOTE — Telephone Encounter (Signed)
FYI

## 2019-01-09 ENCOUNTER — Encounter: Payer: Self-pay | Admitting: Internal Medicine

## 2019-01-09 ENCOUNTER — Ambulatory Visit: Payer: Medicare Other | Attending: Internal Medicine | Admitting: Physical Therapy

## 2019-01-09 ENCOUNTER — Other Ambulatory Visit: Payer: Self-pay

## 2019-01-09 ENCOUNTER — Ambulatory Visit (INDEPENDENT_AMBULATORY_CARE_PROVIDER_SITE_OTHER): Payer: Medicare Other | Admitting: Internal Medicine

## 2019-01-09 VITALS — BP 134/86 | HR 86

## 2019-01-09 DIAGNOSIS — R2689 Other abnormalities of gait and mobility: Secondary | ICD-10-CM | POA: Diagnosis present

## 2019-01-09 DIAGNOSIS — R42 Dizziness and giddiness: Secondary | ICD-10-CM | POA: Insufficient documentation

## 2019-01-09 DIAGNOSIS — R2681 Unsteadiness on feet: Secondary | ICD-10-CM | POA: Diagnosis present

## 2019-01-09 DIAGNOSIS — R0989 Other specified symptoms and signs involving the circulatory and respiratory systems: Secondary | ICD-10-CM

## 2019-01-09 MED ORDER — DILTIAZEM HCL ER COATED BEADS 300 MG PO CP24: 300.0000 mg | ORAL_CAPSULE | Freq: Every day | ORAL | 1 refills | Status: DC

## 2019-01-09 NOTE — Assessment & Plan Note (Signed)
Blood pressure very well controlled at times, but has episodes of BP spiking Recent spike up to 196/100 for no obvious reason BP starting to improve We will try increasing Cardizem to 300 mg daily May need an as needed dose of hydralazine if blood pressure over a certain level We will see how she does with the slightly higher dose of Cardizem She has a follow-up with cardiology in the few days She will monitor her blood pressure twice daily at home, keep a log and bring it to her cardiology appointment She will call with any questions or concerns

## 2019-01-09 NOTE — Patient Instructions (Signed)
  Medications reviewed and updated.  Changes include :   Increase Cardizem to 300 mg daily.   Your prescription(s) have been submitted to your pharmacy. Please take as directed and contact our office if you believe you are having problem(s) with the medication(s).   Monitor your BP twice a day and keep a log of your readings - bring them to your upcoming cardiology appointment.

## 2019-01-10 ENCOUNTER — Encounter: Payer: Self-pay | Admitting: Physical Therapy

## 2019-01-10 NOTE — Therapy (Signed)
[facility] [address] [place], [place], [address] Phone: [phone]   Fax:  [phone]  Physical Therapy Treatment  Patient Details  Name: [name] MRN: [id number] Date of Birth: 02-12-1940 Referring Provider (PT): Binnie Rail, MD   Encounter Date: 01/09/2019  PT End of Session - 01/10/19 2044    Visit Number  12    Number of Visits  17    Date for PT Re-Evaluation  01/06/19    Authorization Type  [employer] Medicare - 10th visit PN    PT Start Time  1401    PT Stop Time  1445    PT Time Calculation (min)  44 min    Activity Tolerance  Patient tolerated treatment well    Behavior During Therapy  [facility] for tasks assessed/performed       Past Medical History:  Diagnosis Date  . Adenomatous colon polyp   . Allergy   . Anxiety   . Asthma   . Chest pain   . Chronic diastolic CHF (congestive heart failure) ([place])   . COPD (chronic obstructive pulmonary disease) ([place])    pt is unsure if has been officially diagnosed  . Coronary artery disease    CABG '09- cathed 12/09, 9/10, 6/11, 3/14 and 12/13/16- medical Rx  . Diabetes mellitus   . GERD (gastroesophageal reflux disease)   . Hiatal hernia   . Hyperlipidemia   . Hypertension   . Myocardial infarction ([place]) 2009  . PONV (postoperative nausea and vomiting)   . Schatzki's ring   . Shoulder injury    resolved after shoulder surgery  . Sleep apnea    not on cpap    Past Surgical History:  Procedure Laterality Date  . ABDOMINAL HYSTERECTOMY    . APPENDECTOMY     came out with Hysterectomy  . CARDIAC CATHETERIZATION  07/23/2009   EF 60%  . CARDIAC CATHETERIZATION  10/11/2008  . CARDIAC CATHETERIZATION  03/01/2007   EF 75-80%  . CARDIOVASCULAR STRESS TEST  11/15/2007   EF 60%  . COLONOSCOPY    . CORONARY ARTERY BYPASS GRAFT     SEVERELY DISEASED SAPHENOUS VEIN GRAFT TO THE RIGHT CORONARY ARTERY BUT WITH FAIRLY WELL PRESERVED FLOW TO THE DISTAL RIGHT  CORONARY ARTERY FROM THE NATIVE CIRCULATION-RESTART  CATH IN JUNE 2000, REVEALS MILD/MODERATE  CAD WITH GOOD FLOW DOWN HER LAD  . ESOPHAGOGASTRODUODENOSCOPY    . EYE SURGERY     bilateral cataract surgery with lens implant  . LEFT HEART CATH AND CORS/GRAFTS ANGIOGRAPHY N/A 12/14/2016   Procedure: LEFT HEART CATH AND CORS/GRAFTS ANGIOGRAPHY;  Surgeon: Jettie Booze, MD;  Location: [facility];  Service: Cardiovascular;  Laterality: N/A;  . lense removal Left   . POLYPECTOMY    . ROTATOR CUFF REPAIR     right and left  . TONSILLECTOMY     age 78  . 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 tumor removed kidney    . UPPER GASTROINTESTINAL ENDOSCOPY    . US ECHOCARDIOGRAPHY  03/08/2008   EF 55-60%    Vitals:   01/09/19 1409  BP: 134/86  Pulse: 86    Subjective Assessment - 01/10/19 2040    Subjective  Pt states she went to ED yesterday (Monday) due to HTN - states they told her to follow up with  her PCP    Patient Stated Goals  To continue to work on balance and prevent falls - "dangerous at my age"    Currently in Pain?  No/denies                            Balance Exercises - 01/10/19 2041      Balance Exercises: Standing   Standing Eyes Opened  Narrow base of support (BOS);Head turns;Foam/compliant surface;5 reps    Standing Eyes Closed  Foam/compliant surface;Narrow base of support (BOS);Wide (BOA);Head turns;5 reps    Rockerboard  Anterior/posterior;10 reps    Step Over Hurdles / Cones  stepping over balance beam 10 reps each leg with UE support prn    Other Standing Exercises  marching on incline and on decline 5 reps each with CGA to min assist           PT Short Term Goals - 01/10/19 2049      PT SHORT TERM GOAL #1   Title  Pt will demonstrate ability to perform initial HEP MOD I    Baseline   met 12-05-18    Time  4    Period  Weeks    Status  Achieved    Target Date  12/07/18      PT SHORT TERM GOAL #2   Title  Pt will participate in further assessment of gait/balance and falls risk with FGA and SOT assessments    Baseline  FGA=18/30    Time  4    Period  Weeks    Status  On-going    Target Date  12/07/18      PT SHORT TERM GOAL #3   Title  Pt will report 25% reduction in episodes of dizziness on a weekly basis    Baseline  pt reports dizziness continues to vary in intensity - reports overall no reduction - 12-07-18    Time  4    Period  Weeks    Status  Not Met    Target Date  12/07/18      PT SHORT TERM GOAL #4   Title  Pt will report decreased motion sensitivity to head turns, sit <> R sidelying and turning quickly in standing to 1/5    Baseline  pt continues to c/o dizziness with head turns; 12-07-18    Time  4    Period  Weeks    Status  On-going    Target Date  12/07/18        PT Long Term Goals - 01/10/19 2049      PT LONG TERM GOAL #1   Title  Pt will demonstrate ability to perform final HEP independently    Time  8    Period  Weeks    Status  New      PT LONG TERM GOAL #2   Title  Pt will improve FGA by 4 points to indicate decreased falls risk    Baseline  18/30    Time  8    Period  Weeks    Status  New      PT LONG TERM GOAL #3   Title  Pt will improve SOT by 8 points to [facility] for age group to indicate improved use of vestibular/visual/sensory system for balance    Baseline  TBD    Time  8    Period  Weeks    Status  New  PT LONG TERM GOAL #4   Title  Pt will report decreased motion sensitivity to head turns, body turns and sit <> sidelying to 0/5    Time  8    Period  Weeks    Status  New      PT LONG TERM GOAL #5   Title  Pt will demonstrate improvement in use of VOR as indicated by ability to perform VOR x 1 viewing in standing >2 minutes with mild onset of symptoms    Time  8    Period  Weeks    Status  New      PT LONG  TERM GOAL #6   Title  Pt will report 50% reduction in dizziness episodes on a weekly basis    Time  8    Period  Weeks    Status  New            Plan - 01/10/19 2046    Clinical Impression Statement  Pt tolerated exercises well today with short rest period needed after performing activities with head turns (both horizontal & vertical incorporated into activity); pt had no c/o nausea with any activities today.  Pt is progressing well towards goals.    Personal Factors and Comorbidities  Age;Comorbidity 3+;Past/Current Experience    PT Frequency  2x / week    PT Duration  8 weeks    PT Treatment/Interventions  ADLs/Self Care Home Management;Canalith Repostioning;Cryotherapy;Moist Heat;Gait training;Stair training;Functional mobility training;Therapeutic activities;Therapeutic exercise;Balance training;Neuromuscular re-education;Patient/family education;Manual techniques;Passive range of motion;Dry needling;Vestibular;Visual/perceptual remediation/compensation    PT Next Visit Plan  balance and gait training with head turns       Patient will benefit from skilled therapeutic intervention in order to improve the following deficits and impairments:     Visit Diagnosis: Other abnormalities of gait and mobility - Plan: PT plan of care cert/re-cert  Unsteadiness on feet - Plan: PT plan of care cert/re-cert     Problem List Patient Active Problem List   Diagnosis Date Noted  . Chest pain at rest 08/24/2018  . Arthralgia 08/01/2017  . COPD with asthma ([place]) 05/04/2017  . Angina pectoris ([place]) 01/05/2017  . Insomnia 11/23/2016  . Chronic nonintractable headache 11/23/2016  . Pancreatic insufficiency 10/28/2016  . Carotid artery disease ([place]) 05/19/2016  . Fatigue 05/19/2016  . Anemia 03/27/2016  . Acute bronchitis with COPD ([place]) 12/05/2015  . Type 1 diabetes mellitus ([place]) 07/21/2015  . B12 deficiency 03/31/2015  . OSA (obstructive sleep apnea) with hypoxia 11/20/2014  . Fecal  incontinence 08/28/2014  . 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 Falls 08/28/2014  . Neck pain of over 3 months duration 08/28/2014  . H/O total knee replacement 02/20/2014  . Obesity (BMI 30-39.9) 11/12/2013  . Chronic diastolic CHF (congestive heart failure) ([place]) 11/17/2012  . Meniscus, lateral, anterior horn derangement 11/10/2011  . Medial meniscus, posterior horn derangement 11/10/2011  . Osteoarthritis of right knee 11/10/2011  . Vertigo 10/05/2011  . Labile hypertension 10/05/2011  . CAD (coronary artery disease) 08/27/2010  . MUSCLE CRAMPS 12/29/2009  . History of myocardial infarction 11/19/2009  . Extrinsic asthma 11/19/2009  . GERD 11/19/2009  . Cough 11/19/2009  . Hyperlipidemia 11/18/2009    Alda Lea, PT 01/10/2019, 9:03 PM  [facility] [address] [place], [place], [address] Phone: [phone]   Fax:  [phone]  Name: ONELIA CADMUS MRN: [id number] Date of Birth: 1941-02-01

## 2019-01-11 ENCOUNTER — Other Ambulatory Visit: Payer: Self-pay

## 2019-01-11 ENCOUNTER — Ambulatory Visit: Payer: Medicare Other | Admitting: Physical Therapy

## 2019-01-11 VITALS — BP 151/95 | HR 93

## 2019-01-11 DIAGNOSIS — R2681 Unsteadiness on feet: Secondary | ICD-10-CM

## 2019-01-11 DIAGNOSIS — R42 Dizziness and giddiness: Secondary | ICD-10-CM

## 2019-01-11 DIAGNOSIS — R2689 Other abnormalities of gait and mobility: Secondary | ICD-10-CM | POA: Diagnosis not present

## 2019-01-11 NOTE — Progress Notes (Signed)
Virtual Visit via Telephone Note   This visit type was conducted due to national recommendations for restrictions regarding the COVID-19 Pandemic (e.g. social distancing) in an effort to limit this patient's exposure and mitigate transmission in our community.  Due to her co-morbid illnesses, this patient is at least at moderate risk for complications without adequate follow up.  This format is felt to be most appropriate for this patient at this time.  The patient did not have access to video technology/had technical difficulties with video requiring transitioning to audio format only (telephone).  All issues noted in this document were discussed and addressed.  No physical exam could be performed with this format.  Please refer to the patient's chart for her  consent to telehealth for [facility].   Date:  01/12/2019   ID:  [name], DOB Nov 16, 1940, MRN [id number]  Patient Location: Home Provider Location: Home  PCP:  Binnie Rail, MD  Cardiologist:  Mertie Moores, MD   Electrophysiologist:  None   Evaluation Performed:  Follow-Up Visit  Chief Complaint: Follow-up after recent trip to the emergency room with elevated blood pressure  History of Present Illness:    [name] is a 78 y.o. female with   Coronary artery disease   S/p CABG  2/3 grafts patent by cath 12/2016  Myoview 01/2016: Low Risk  RBBB   Chronic Diastolic CHF  Echocardiogram 02/2017: EF 65-70, Gr 2 DD  Diabetes mellitus   Hyperlipidemia   Hypertension   COPD  She was last seen by Dr. Acie Fredrickson in 08/2018 via Telemedicine.  She was recently evaluated in the ED on 01/08/2019 for elevated BP (178/93).  Hgb, Creatinine, CXR and ECG were all unremarkable.  She was seen back in follow up by primary care on 01/09/2019 and her Diltiazem CD dose was increased to 300 mg once daily.    Today, she notes that her blood pressure started to increase last Thursday.  She cooks her own food for Thanksgiving and  does not believe that her food had extra salt in it compared to her normal.  Her pressure typically is in the 160s/70s.  Last week it started to increase to 160s/100s.  She did have some increased pedal edema when she returned from the emergency room.  This resolved with leg elevation.  She had some right axillary pain at the time she went to the emergency room.  This seemed to improve with improved blood pressure.  She has not had any symptoms reminiscent of her previous angina.  She has not had significant shortness of breath.  She does not like to sleep on her back.  She has not had syncope.  She does have issues with vertigo.  The patient does not have symptoms concerning for COVID-19 infection (fever, chills, cough, or new shortness of breath).    Past Medical History:  Diagnosis Date  . Adenomatous colon polyp   . Allergy   . Anxiety   . Asthma   . Carotid artery disease ([place])    carotid US 02/2017: bilat ICA 1-39%  . Chronic diastolic CHF    Echo 08/15: EF 65-70, Gr 2 DD, mild MS (mean 5), PASP 44  . COPD    pt is unsure if has been officially diagnosed  . Coronary artery disease    CABG '09- cathed 12/09, 9/10, 6/11, 3/14 and 12/13/16- medical Rx // cath 12/2016 - 2/3 grafts patent >> med Rx // Myoview 12/17: low risk  .  Diabetes mellitus   . Gastroesophageal reflux disease   . Hiatal hernia   . 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 Hyperlipidemia   . Hypertension   . PONV (postoperative nausea and vomiting)   . Schatzki's ring   . Shoulder injury    resolved after shoulder surgery  . Sleep apnea    not on cpap   Past Surgical History:  Procedure Laterality Date  . ABDOMINAL HYSTERECTOMY    . APPENDECTOMY     came out with Hysterectomy  . CARDIAC CATHETERIZATION  07/23/2009   EF 60%  . CARDIAC CATHETERIZATION  10/11/2008  . CARDIAC CATHETERIZATION  03/01/2007   EF 75-80%  . CARDIOVASCULAR STRESS TEST  11/15/2007   EF 60%  . COLONOSCOPY    . CORONARY ARTERY  BYPASS GRAFT     SEVERELY DISEASED SAPHENOUS VEIN GRAFT TO THE RIGHT CORONARY ARTERY BUT WITH FAIRLY WELL PRESERVED FLOW TO THE DISTAL RIGHT CORONARY ARTERY FROM THE NATIVE CIRCULATION-RESTART  CATH IN JUNE 2000, REVEALS MILD/MODERATE  CAD WITH GOOD FLOW DOWN HER LAD  . ESOPHAGOGASTRODUODENOSCOPY    . EYE SURGERY     bilateral cataract surgery with lens implant  . LEFT HEART CATH AND CORS/GRAFTS ANGIOGRAPHY N/A 12/14/2016   Procedure: LEFT HEART CATH AND CORS/GRAFTS ANGIOGRAPHY;  Surgeon: Jettie Booze, MD;  Location: [facility];  Service: Cardiovascular;  Laterality: N/A;  . lense removal Left   . POLYPECTOMY    . ROTATOR CUFF REPAIR     right and left  . TONSILLECTOMY     age 55  . 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 tumor removed kidney    . UPPER GASTROINTESTINAL ENDOSCOPY    . US ECHOCARDIOGRAPHY  03/08/2008   EF 55-60%     Current Meds  Medication Sig  . albuterol (PROVENTIL HFA;VENTOLIN HFA) 108 (90 BASE) MCG/ACT inhaler Inhale 2 puffs into the lungs every 6 (six) hours as needed. For shortness of breath.  Marland Kitchen albuterol (PROVENTIL) (2.5 MG/3ML) 0.083% nebulizer solution USE 1 VIAL IN NEBULIZER EVERY 6 HOURS - and as needed  . alum & mag hydroxide-simeth (MAALOX/MYLANTA) 200-200-20 MG/5ML suspension Take 30 mLs as needed by mouth for indigestion or heartburn.  Marland Kitchen aspirin EC 81 MG tablet Take 1 tablet (81 mg total) by mouth daily.  . baclofen (LIORESAL) 20 MG tablet Take 1 tablet (20 mg total) by mouth 3 (three) times daily. Need office visit for more refills  . Blood Glucose Monitoring Suppl (ONE TOUCH ULTRA MINI) w/Device KIT 3 (three) times daily. for testing  . calcium carbonate (TUMS - DOSED IN MG ELEMENTAL CALCIUM) 500 MG chewable tablet Chew 2 tablets daily as needed by mouth for indigestion or heartburn.  .  Cholecalciferol (VITAMIN D) 2000 UNITS tablet Take 2,000 Units by mouth daily.   . clonazePAM (KLONOPIN) 0.5 MG tablet Take 0.5 mg daily by mouth.  . diclofenac sodium (VOLTAREN) 1 % GEL Apply 2 g topically 4 (four) times daily as needed (muscle pain).  Marland Kitchen diltiazem (CARDIZEM CD) 300 MG 24 hr capsule Take 1 capsule (300 mg total) by mouth daily.  Marland Kitchen ezetimibe (ZETIA) 10 MG tablet Take 10 mg by mouth daily.  . famotidine (PEPCID) 40 MG tablet Take 1 tablet (40 mg total) by mouth daily as needed.  . fexofenadine (ALLEGRA) 180 MG tablet  Take 1 tablet (180 mg total) by mouth daily.  . fluticasone (FLONASE) 50 MCG/ACT nasal spray Place 2 sprays into both nostrils daily.  . Fluticasone-Salmeterol (ADVAIR DISKUS) 250-50 MCG/DOSE AEPB Inhale 1 puff into the lungs 2 (two) times daily.  . furosemide (LASIX) 40 MG tablet Take 1 tablet (40 mg total) by mouth daily.  . insulin lispro protamine-insulin lispro (HUMALOG 75/25) (75-25) 100 UNIT/ML SUSP Inject 15-22 Units into the skin See admin instructions. Takes 16 units in the morning, 10 units at lunch, and 25  units with supper  . isosorbide mononitrate (IMDUR) 60 MG 24 hr tablet Take 1.5 tablets (90 mg total) by mouth daily.  . lansoprazole (PREVACID) 30 MG capsule TAKE 1 CAPSULE BY MOUTH TWICE A DAY BEFORE A MEAL  . loperamide (IMODIUM) 2 MG capsule Take 2 mg by mouth as needed for diarrhea or loose stools.   . meclizine (ANTIVERT) 25 MG tablet Take 1 tablet (25 mg total) by mouth 2 (two) times daily as needed for dizziness.  . metoprolol succinate (TOPROL-XL) 100 MG 24 hr tablet Take 1 tablet (100 mg total) by mouth daily.  . montelukast (SINGULAIR) 5 MG chewable tablet CHEW 1 TABLET (5 MG TOTAL) BY MOUTH AT BEDTIME.  . Multiple Vitamin (MULTIVITAMIN WITH MINERALS) TABS Take 1 tablet by mouth daily.  . Nebulizers (COMPRESSOR/NEBULIZER) MISC Use with albuterol  . nitroGLYCERIN (NITROSTAT) 0.4 MG SL tablet Place 0.4 mg under the tongue every 5 (five) minutes  as needed for chest pain.  . nortriptyline (PAMELOR) 75 MG capsule TAKE 1 CAPSULE (75 MG TOTAL) BY MOUTH AT BEDTIME.  . ONE TOUCH ULTRA TEST test strip CHECK BLOOD SUGAR TWICE A DAY DX: E11.9  . ONETOUCH DELICA LANCETS 37T MISC 3 (three) times daily. for testing  . Spacer/Aero-Holding Chambers (AEROCHAMBER MV) inhaler Use as instructed  . Tiotropium Bromide Monohydrate (SPIRIVA RESPIMAT) 2.5 MCG/ACT AERS Inhale 2 puffs into the lungs daily.   Current Facility-Administered Medications for the 01/12/19 encounter (Telemedicine) with Richardson Dopp T, PA-C  Medication  . cyanocobalamin ((VITAMIN B-12)) injection 1,000 mcg     Allergies:   Amlodipine, Banana, Co q10 [coenzyme q10], Codeine, Metformin and related, Morphine, Pentazocine, Pravastatin, Repatha [evolocumab], Statins, Sulfa antibiotics, Sulfonamide derivatives, and Tramadol hcl   Social History   Tobacco Use  . Smoking status: Former Smoker    Packs/day: 0.50    Years: 21.00    Pack years: 10.50    Types: Cigarettes    Start date: 02/09/1955    Quit date: 02/09/1976    Years since quitting: 42.9  . Smokeless tobacco: Never Used  Substance Use Topics  . Alcohol use: No    Alcohol/week: 0.0 standard drinks  . Drug use: No     Family Hx: The patient's family history includes Diabetes in her maternal grandfather and mother; Emphysema in her paternal aunt and paternal uncle; Esophageal cancer (age of onset: 96) in her brother; Healthy in her child; Heart attack in her father; Heart disease in her maternal grandfather; Heart failure in her maternal grandfather; Rheum arthritis in her sister. There is no history of Neuropathy, Multiple sclerosis, Colon cancer, Colon polyps, Rectal cancer, or Stomach cancer.  ROS:   Please see the history of present illness.    She has not had melena, hematochezia, hematuria.  She has headaches in the mornings related to untreated sleep apnea.  She notes that her pulmonologist is getting her set up for  another CPAP machine. All other systems reviewed and are  negative.   Prior CV studies:   The following studies were reviewed today:  Carotid US 02/18/17 Final Interpretation: Right Carotid: There is evidence in the right ICA of a 1-39% stenosis. Left Carotid: There is evidence in the left ICA of a 1-39% stenosis. Vertebrals:  Both vertebral arteries were patent with antegrade flow. Subclavians: Normal flow hemodynamics were seen in bilateral subclavian arteries.  Echocardiogram 02/17/2017 EF 65-70, no RWMA, Gr 2 DD, severe MAC, mild MS (mean 5), mild LAE, trivial PI, PASP 44  Event Monitor 01/2017  NSR  No significant arrhythmias to explain syncope and dizzy symptoms  Cardiac catheterization 12/14/16 LAD prox 80; D1 ost 100 RCA prox 25, mid 25 S-PDA 100 L-LAD patent  S-D1 patent  EF 55-65    Nuclear stress test 01/12/16 EF 62, normal perfusion, low risk  Echocardiogram 09/18/15 EF 60-65, normal wall motion, abnormal relaxation, moderate to severe MAC, mild mitral stenosis (mean gradient 4), PASP 42  Carotid US 09/08/15 Bilateral ICA 1-39  Echocardiogram 01/07/15 EF 60-65, normal wall motion, grade 1 diastolic dysfunction, moderate to severe MAC, mild LAE, PASP 46, mildly reduced RV SF  Nuclear stress test 06/25/14 Normal perfusion, EF 55-65, low risk  Nuclear stress test 03/22/13 EF 68, no significant scar or ischemia, low risk  LHC 04/20/12:  Proximal LAD 50%, mid LAD 50-60% circumflex normal proximal RCA 40-50% SVG-RCA occluded LIMA-LAD patent SVG-D1 patent.  Medical therapy was recommended  Echo 09/2011:  EF 60-55%, normal wall motion, grade 1 diastolic dysfunction, severe MAC.   Nuclear study 01/2011:  EF 72%, small fixed inferobasal defect suggestive of thinning, no ischemia.   Labs/Other Tests and Data Reviewed:    EKG:  An ECG dated 01/08/2019 was personally reviewed today and demonstrated:  sinus tachycardia, HR 100, RBBB, no acute ST-TW  changes, QTc 510, no change from prior tracing   Recent Labs: 01/08/2019: BUN 16; Creatinine, Ser 0.69; Hemoglobin 12.5; Platelets 286; Potassium 4.6; Sodium 141   Recent Lipid Panel Lab Results  Component Value Date/Time   CHOL 165 12/20/2017 07:40 AM   CHOL 119 12/22/2016 09:13 AM   TRIG 136.0 12/20/2017 07:40 AM   HDL 37.40 (L) 12/20/2017 07:40 AM   HDL 43 12/22/2016 09:13 AM   CHOLHDL 4 12/20/2017 07:40 AM   LDLCALC 101 (H) 12/20/2017 07:40 AM   LDLCALC 62 12/22/2016 09:13 AM     Wt Readings from Last 3 Encounters:  01/12/19 189 lb (85.7 kg)  01/09/19 204 lb (92.5 kg)  01/08/19 189 lb (85.7 kg)     Objective:    Vital Signs:  BP (!) 167/97   Pulse 100   Ht _0  (1.575 m)   Wt 189 lb (85.7 kg)   LMP  (LMP Unknown)   BMI 34.57 kg/m    VITAL SIGNS:  reviewed GEN:  no acute distress RESPIRATORY:  No labored breathing noted NEURO:  Alert and oriented PSYCH:  Normal mood  ASSESSMENT & PLAN:    1. Essential hypertension Blood pressure is currently running above target.  She typically sees systolic readings in the 119J.  Even though she did not add any extra salt to her food last week, question if her pressures are elevated in response to recent holiday meals.  Her PCP increased her diltiazem last week.  She has not picked up that prescription yet.  I suspect she will need another agent to improve her blood pressure control.  She does not appear to have any allergy to ACE inhibitors and  is currently not on an ACE inhibitor or an ARB.  Given her history of COPD, I would recommend using an ARB instead of an ACE inhibitor.  -Monitor blood pressure over the next couple of weeks and send readings for review  -If blood pressure above target (>130/80), add losartan 50 mg daily  -Follow-up with Dr. Acie Fredrickson or me in 8 weeks  2. Chronic diastolic CHF (congestive heart failure) (HCC) Echocardiogram 02/2017 demonstrated EF 65-70% with moderate diastolic dysfunction.  Overall, volume  status seems to be stable.  Continue current dose of furosemide.  3. Coronary artery disease involving coronary bypass graft of native heart with angina pectoris ([place]) History of CABG.  Cardiac catheterization in November 2018 demonstrated 2/3 grafts patent.  She is managed medically.  Continue aspirin, ezetimibe.  4. 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  As noted, if her blood pressure remains above target, add ARB to her regimen.   Time:   Today, I have spent 16 minutes with the patient with telehealth technology discussing the above problems.     Medication Adjustments/Labs and Tests Ordered: Current medicines are reviewed at length with the patient today.  Concerns regarding medicines are outlined above.   Tests Ordered: No orders of the defined types were placed in this encounter.   Medication Changes: No orders of the defined types were placed in this encounter.   Follow Up:  Either In Person or Virtual in 8 week(s)  Signed, Richardson Dopp, PA-C  01/12/2019 12:59 PM    [place]

## 2019-01-12 ENCOUNTER — Encounter: Payer: Self-pay | Admitting: Physician Assistant

## 2019-01-12 ENCOUNTER — Encounter: Payer: Self-pay | Admitting: Physical Therapy

## 2019-01-12 ENCOUNTER — Telehealth (INDEPENDENT_AMBULATORY_CARE_PROVIDER_SITE_OTHER): Payer: Medicare Other | Admitting: Physician Assistant

## 2019-01-12 VITALS — BP 167/97 | HR 100 | Ht 62.0 in | Wt 189.0 lb

## 2019-01-12 DIAGNOSIS — I10 Essential (primary) hypertension: Secondary | ICD-10-CM

## 2019-01-12 DIAGNOSIS — I25709 Atherosclerosis of coronary artery bypass graft(s), unspecified, with unspecified angina pectoris: Secondary | ICD-10-CM

## 2019-01-12 DIAGNOSIS — E1059 Type 1 diabetes mellitus with other circulatory complications: Secondary | ICD-10-CM

## 2019-01-12 DIAGNOSIS — I5032 Chronic diastolic (congestive) heart failure: Secondary | ICD-10-CM

## 2019-01-12 NOTE — Therapy (Signed)
[facility] [address] [place], [place], [address] Phone: [phone]   Fax:  [phone]  Physical Therapy Treatment  Patient Details  Name: [name] MRN: [id number] Date of Birth: 07/22/1940 Referring Provider (PT): Binnie Rail, MD   Encounter Date: 01/11/2019  PT End of Session - 01/12/19 2105    Visit Number  13    Number of Visits  17    Date for PT Re-Evaluation  01/06/19    Authorization Type  [employer] Medicare - 10th visit PN    PT Start Time  1450    PT Stop Time  1531    PT Time Calculation (min)  41 min    Activity Tolerance  Patient tolerated treatment well    Behavior During Therapy  [facility] for tasks assessed/performed       Past Medical History:  Diagnosis Date  . Adenomatous colon polyp   . Allergy   . Anxiety   . Asthma   . Carotid artery disease ([place])    carotid US 02/2017: bilat ICA 1-39%  . Chronic diastolic CHF    Echo 04/6642: EF 65-70, Gr 2 DD, mild MS (mean 5), PASP 44  . COPD    pt is unsure if has been officially diagnosed  . Coronary artery disease    CABG '09- cathed 12/09, 9/10, 6/11, 3/14 and 12/13/16- medical Rx // cath 12/2016 - 2/3 grafts patent >> med Rx // Myoview 12/17: low risk  . Diabetes mellitus   . Gastroesophageal reflux disease   . Hiatal hernia   . 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 Hyperlipidemia   . Hypertension   . PONV (postoperative nausea and vomiting)   . Schatzki's ring   . Shoulder injury    resolved after shoulder surgery  . Sleep apnea    not on cpap    Past Surgical History:  Procedure Laterality Date  . ABDOMINAL HYSTERECTOMY    . APPENDECTOMY     came out with Hysterectomy  . CARDIAC CATHETERIZATION  07/23/2009   EF 60%  . CARDIAC CATHETERIZATION  10/11/2008  . CARDIAC CATHETERIZATION  03/01/2007   EF 75-80%  . CARDIOVASCULAR STRESS TEST  11/15/2007   EF 60%  . COLONOSCOPY    . CORONARY ARTERY BYPASS GRAFT     SEVERELY  DISEASED SAPHENOUS VEIN GRAFT TO THE RIGHT CORONARY ARTERY BUT WITH FAIRLY WELL PRESERVED FLOW TO THE DISTAL RIGHT CORONARY ARTERY FROM THE NATIVE CIRCULATION-RESTART  CATH IN JUNE 2000, REVEALS MILD/MODERATE  CAD WITH GOOD FLOW DOWN HER LAD  . ESOPHAGOGASTRODUODENOSCOPY    . EYE SURGERY     bilateral cataract surgery with lens implant  . LEFT HEART CATH AND CORS/GRAFTS ANGIOGRAPHY N/A 12/14/2016   Procedure: LEFT HEART CATH AND CORS/GRAFTS ANGIOGRAPHY;  Surgeon: Jettie Booze, MD;  Location: [facility];  Service: Cardiovascular;  Laterality: N/A;  . lense removal Left   . POLYPECTOMY    . ROTATOR CUFF REPAIR     right and left  . TONSILLECTOMY     age 78  . 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 tumor removed kidney    . UPPER GASTROINTESTINAL ENDOSCOPY    . US ECHOCARDIOGRAPHY  03/08/2008   EF 55-60%    Vitals:   01/11/19 1500 01/11/19 1531  BP: (!) 148/80 (!) 151/95  Pulse: 93   SpO2: 96%     Subjective Assessment - 01/11/19 1455    Subjective  Pt states her BP is slowly coming down; pt states she feels exhausted and doesn't know why - has cardiologist appt tomorrow    Patient Stated Goals  To continue to work on balance and prevent falls - "dangerous at my age"    Currently in Pain?  No/denies                       [facility] Adult PT Treatment/Exercise - 01/12/19 0001      Ambulation/Gait   Ramp  5: Supervision    Gait Comments  walking and tossing ball (straight ahead) 30' x 2 reps           Balance Exercises - 01/12/19 2104      Balance Exercises: Standing   Standing Eyes Opened  Wide (BOA);Head turns;Foam/compliant surface;5 reps    Standing Eyes Closed  Wide (BOA);Head turns;Foam/compliant surface;5 reps    Rockerboard  Anterior/posterior    Other Standing Exercises  marching on incline and on  decline 10 reps each with CGA to min assist           PT Short Term Goals - 01/12/19 2111      PT SHORT TERM GOAL #1   Title  Pt will demonstrate ability to perform initial HEP MOD I    Baseline  met 12-05-18    Time  4    Period  Weeks    Status  Achieved    Target Date  12/07/18      PT SHORT TERM GOAL #2   Title  Pt will participate in further assessment of gait/balance and falls risk with FGA and SOT assessments    Baseline  FGA=18/30    Time  4    Period  Weeks    Status  On-going    Target Date  12/07/18      PT SHORT TERM GOAL #3   Title  Pt will report 25% reduction in episodes of dizziness on a weekly basis    Baseline  pt reports dizziness continues to vary in intensity - reports overall no reduction - 12-07-18    Time  4    Period  Weeks    Status  Not Met    Target Date  12/07/18      PT SHORT TERM GOAL #4   Title  Pt will report decreased motion sensitivity to head turns, sit <> R sidelying and turning quickly in standing to 1/5    Baseline  pt continues to c/o dizziness with head turns; 12-07-18    Time  4    Period  Weeks    Status  On-going    Target Date  12/07/18        PT Long Term Goals - 01/12/19 2111      PT LONG TERM GOAL #1   Title  Pt will demonstrate ability to perform final HEP independently    Time  8    Period  Weeks    Status  New      PT LONG TERM GOAL #2   Title  Pt will improve FGA by 4 points to indicate decreased falls risk    Baseline  18/30    Time  8    Period  Weeks    Status  New      PT LONG  TERM GOAL #3   Title  Pt will improve SOT by 8 points to [facility] for age group to indicate improved use of vestibular/visual/sensory system for balance    Baseline  TBD    Time  8    Period  Weeks    Status  New      PT LONG TERM GOAL #4   Title  Pt will report decreased motion sensitivity to head turns, body turns and sit <> sidelying to 0/5    Time  8    Period  Weeks    Status  New      PT LONG TERM GOAL #5   Title   Pt will demonstrate improvement in use of VOR as indicated by ability to perform VOR x 1 viewing in standing >2 minutes with mild onset of symptoms    Time  8    Period  Weeks    Status  New      PT LONG TERM GOAL #6   Title  Pt will report 50% reduction in dizziness episodes on a weekly basis    Time  8    Period  Weeks    Status  New            Plan - 01/12/19 2107    Clinical Impression Statement  Pt continues to c/o dizziness with head and eye movement and requires short seated rest periods for dizziness to subside.  Balance is improving as pt demonstrates ability to maintain balance on rockerboard with minimal UE support.    Personal Factors and Comorbidities  Age;Comorbidity 3+;Past/Current Experience    PT Frequency  2x / week    PT Duration  8 weeks    PT Treatment/Interventions  ADLs/Self Care Home Management;Canalith Repostioning;Cryotherapy;Moist Heat;Gait training;Stair training;Functional mobility training;Therapeutic activities;Therapeutic exercise;Balance training;Neuromuscular re-education;Patient/family education;Manual techniques;Passive range of motion;Dry needling;Vestibular;Visual/perceptual remediation/compensation    PT Next Visit Plan  balance and gait training with head turns       Patient will benefit from skilled therapeutic intervention in order to improve the following deficits and impairments:     Visit Diagnosis: Unsteadiness on feet  Dizziness and giddiness     Problem List Patient Active Problem List   Diagnosis Date Noted  . Chest pain at rest 08/24/2018  . Arthralgia 08/01/2017  . COPD with asthma ([place]) 05/04/2017  . Angina pectoris ([place]) 01/05/2017  . Insomnia 11/23/2016  . Chronic nonintractable headache 11/23/2016  . Pancreatic insufficiency 10/28/2016  . Carotid artery disease ([place]) 05/19/2016  . Fatigue 05/19/2016  . Anemia 03/27/2016  . Acute bronchitis with COPD ([place]) 12/05/2015  . Type 1 diabetes mellitus ([place]) 07/21/2015   . B12 deficiency 03/31/2015  . OSA (obstructive sleep apnea) with hypoxia 11/20/2014  . Fecal incontinence 08/28/2014  . 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 Falls 08/28/2014  . Neck pain of over 3 months duration 08/28/2014  . H/O total knee replacement 02/20/2014  . Obesity (BMI 30-39.9) 11/12/2013  . Chronic diastolic CHF (congestive heart failure) ([place]) 11/17/2012  . Meniscus, lateral, anterior horn derangement 11/10/2011  . Medial meniscus, posterior horn derangement 11/10/2011  . Osteoarthritis of right knee 11/10/2011  . Vertigo 10/05/2011  . Labile hypertension 10/05/2011  . CAD (coronary artery disease) 08/27/2010  . MUSCLE CRAMPS 12/29/2009  . History of myocardial infarction 11/19/2009  . Extrinsic asthma 11/19/2009  . GERD 11/19/2009  . Cough 11/19/2009  . Hyperlipidemia 11/18/2009    [place]ness [place], PT 01/12/2019, 9:12 PM  [facility]  [place], [place], [address] Phone: [phone]   Fax:  [phone]  Name: [name] MRN: [id number] Date of Birth: 1940/09/04

## 2019-01-12 NOTE — Patient Instructions (Addendum)
Medication Instructions:  No changes.  Please start on the higher dose of Diltiazem (Cardizem) that your primary care provider recommended.    *If you need a refill on your cardiac medications before your next appointment, please call your pharmacy*  Lab Work: None   If you have labs (blood work) drawn today and your tests are completely normal, you will receive your results only by: Marland Kitchen MyChart Message (if you have MyChart) OR . A paper copy in the mail If you have any lab test that is abnormal or we need to change your treatment, we will call you to review the results.  Testing/Procedures: None   Follow-Up: At [facility], you and your health needs are our priority.  As part of our continuing mission to provide you with exceptional heart care, we have created designated Provider Care Teams.  These Care Teams include your primary Cardiologist (physician) and Advanced Practice Providers (APPs -  Physician Assistants and Nurse Practitioners) who all work together to provide you with the care you need, when you need it.  Your next appointment:   February 9,2021 at 10:20  The format for your next appointment:   In Person  Provider:   Dr Acie Fredrickson  Other Instructions  Monitor blood pressure for 2 weeks after starting on the higher dose of Diltiazem.  You can check it daily or every other day.  Write the blood pressure readings down and send those to me for review.  If you can get signed up for MyChart, you can send it through a message.  Otherwise, you can call the office with your readings.

## 2019-01-16 ENCOUNTER — Other Ambulatory Visit: Payer: Self-pay

## 2019-01-16 ENCOUNTER — Ambulatory Visit: Payer: Medicare Other | Admitting: Physical Therapy

## 2019-01-16 DIAGNOSIS — R2689 Other abnormalities of gait and mobility: Secondary | ICD-10-CM | POA: Diagnosis not present

## 2019-01-17 ENCOUNTER — Encounter: Payer: Self-pay | Admitting: Physical Therapy

## 2019-01-17 NOTE — Therapy (Signed)
[facility] [address] [place], [place], [address] Phone: [phone]   Fax:  [phone]  Physical Therapy Treatment  Patient Details  Name: [name] MRN: [id number] Date of Birth: 06/23/1940 Referring Provider (PT): Binnie Rail, MD   Encounter Date: 01/16/2019  PT End of Session - 01/17/19 2027    Visit Number  14    Number of Visits  17    Date for PT Re-Evaluation  01/06/19    Authorization Type  [employer] Medicare - 10th visit PN    PT Start Time  1401    PT Stop Time  1432   session ended early due to c/o pain in RLE   PT Time Calculation (min)  31 min    Activity Tolerance  Patient tolerated treatment well    Behavior During Therapy  [facility] for tasks assessed/performed       Past Medical History:  Diagnosis Date  . Adenomatous colon polyp   . Allergy   . Anxiety   . Asthma   . Carotid artery disease ([place])    carotid US 02/2017: bilat ICA 1-39%  . Chronic diastolic CHF    Echo 04/3381: EF 65-70, Gr 2 DD, mild MS (mean 5), PASP 44  . COPD    pt is unsure if has been officially diagnosed  . Coronary artery disease    CABG '09- cathed 12/09, 9/10, 6/11, 3/14 and 12/13/16- medical Rx // cath 12/2016 - 2/3 grafts patent >> med Rx // Myoview 12/17: low risk  . Diabetes mellitus   . Gastroesophageal reflux disease   . Hiatal hernia   . 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 Hyperlipidemia   . Hypertension   . PONV (postoperative nausea and vomiting)   . Schatzki's ring   . Shoulder injury    resolved after shoulder surgery  . Sleep apnea    not on cpap    Past Surgical History:  Procedure Laterality Date  . ABDOMINAL HYSTERECTOMY    . APPENDECTOMY     came out with Hysterectomy  . CARDIAC CATHETERIZATION  07/23/2009   EF 60%  . CARDIAC CATHETERIZATION  10/11/2008  . CARDIAC CATHETERIZATION  03/01/2007   EF 75-80%  . CARDIOVASCULAR STRESS TEST  11/15/2007   EF 60%  . COLONOSCOPY    .  CORONARY ARTERY BYPASS GRAFT     SEVERELY DISEASED SAPHENOUS VEIN GRAFT TO THE RIGHT CORONARY ARTERY BUT WITH FAIRLY WELL PRESERVED FLOW TO THE DISTAL RIGHT CORONARY ARTERY FROM THE NATIVE CIRCULATION-RESTART  CATH IN JUNE 2000, REVEALS MILD/MODERATE  CAD WITH GOOD FLOW DOWN HER LAD  . ESOPHAGOGASTRODUODENOSCOPY    . EYE SURGERY     bilateral cataract surgery with lens implant  . LEFT HEART CATH AND CORS/GRAFTS ANGIOGRAPHY N/A 12/14/2016   Procedure: LEFT HEART CATH AND CORS/GRAFTS ANGIOGRAPHY;  Surgeon: Jettie Booze, MD;  Location: [facility];  Service: Cardiovascular;  Laterality: N/A;  . lense removal Left   . POLYPECTOMY    . ROTATOR CUFF REPAIR     right and left  . TONSILLECTOMY     age 31  . 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 tumor removed kidney    . UPPER GASTROINTESTINAL ENDOSCOPY    . US ECHOCARDIOGRAPHY  03/08/2008   EF 55-60%  There were no vitals filed for this visit.  Subjective Assessment - 01/16/19 1412    Subjective  Pt reports she woke up Friday morning and couldn't walk; Sunday she could walk but with alot of pain "screaming"; pt is walking with antalgic gait due to pain in RLE    Patient Stated Goals  To continue to work on balance and prevent falls - "dangerous at my age"    Currently in Pain?  Yes    Pain Score  9     Pain Location  Buttocks   Rt buttock (sciatica?)   Pain Orientation  Right    Pain Descriptors / Indicators  Sharp;Radiating    Pain Type  Acute pain    Pain Onset  In the past 7 days    Pain Frequency  Constant       Rt piriformis stretches performed - initially in seated position with pt crossing Rt leg over Lt leg  Pt in supine position - passive Rt hamstring stretch 30 sec hold x 2 reps Rt piriformis stretch 20 sec hold with min assist to hold position; small trunk rotation  with legs to left side 15 sec hold  Self Care- discussed with pt that symptoms appear to be consistent with sciatica; pt states she is using heat/ice combo on Rt hip Area which seems to be helping    Pt unable to tolerate standing/walking due to c/o Rt hip and RLE pain - therefore, balance exercises unable to be performed                      PT Short Term Goals - 01/17/19 2033      PT SHORT TERM GOAL #1   Title  Pt will demonstrate ability to perform initial HEP MOD I    Baseline  met 12-05-18    Time  4    Period  Weeks    Status  Achieved    Target Date  12/07/18      PT SHORT TERM GOAL #2   Title  Pt will participate in further assessment of gait/balance and falls risk with FGA and SOT assessments    Baseline  FGA=18/30    Time  4    Period  Weeks    Status  On-going    Target Date  12/07/18      PT SHORT TERM GOAL #3   Title  Pt will report 25% reduction in episodes of dizziness on a weekly basis    Baseline  pt reports dizziness continues to vary in intensity - reports overall no reduction - 12-07-18    Time  4    Period  Weeks    Status  Not Met    Target Date  12/07/18      PT SHORT TERM GOAL #4   Title  Pt will report decreased motion sensitivity to head turns, sit <> R sidelying and turning quickly in standing to 1/5    Baseline  pt continues to c/o dizziness with head turns; 12-07-18    Time  4    Period  Weeks    Status  On-going    Target Date  12/07/18        PT Long Term Goals - 01/17/19 2033      PT LONG TERM GOAL #1   Title  Pt will demonstrate ability to perform final HEP independently    Time  8    Period  Weeks  Status  New      PT LONG TERM GOAL #2   Title  Pt will improve FGA by 4 points to indicate decreased falls risk    Baseline  18/30    Time  8    Period  Weeks    Status  New      PT LONG TERM GOAL #3   Title  Pt will improve SOT by 8 points to [facility] for age group to indicate improved use of  vestibular/visual/sensory system for balance    Baseline  TBD    Time  8    Period  Weeks    Status  New      PT LONG TERM GOAL #4   Title  Pt will report decreased motion sensitivity to head turns, body turns and sit <> sidelying to 0/5    Time  8    Period  Weeks    Status  New      PT LONG TERM GOAL #5   Title  Pt will demonstrate improvement in use of VOR as indicated by ability to perform VOR x 1 viewing in standing >2 minutes with mild onset of symptoms    Time  8    Period  Weeks    Status  New      PT LONG TERM GOAL #6   Title  Pt will report 50% reduction in dizziness episodes on a weekly basis    Time  8    Period  Weeks    Status  New            Plan - 01/17/19 2030    Clinical Impression Statement  Pt amb. with very antalgic gait pattern due to c/o pain in Rt hip, radiating down posterior Rt leg - symptoms consistent with sciatica.  Pt states pain started on Friday of last week.  Pt states she has called her MD and is waiting to hear back from his office.  Piriformis stretches provided some relief per pt report.    Personal Factors and Comorbidities  Age;Comorbidity 3+;Past/Current Experience    PT Frequency  2x / week    PT Duration  8 weeks    PT Treatment/Interventions  ADLs/Self Care Home Management;Canalith Repostioning;Cryotherapy;Moist Heat;Gait training;Stair training;Functional mobility training;Therapeutic activities;Therapeutic exercise;Balance training;Neuromuscular re-education;Patient/family education;Manual techniques;Passive range of motion;Dry needling;Vestibular;Visual/perceptual remediation/compensation    PT Next Visit Plan  balance and gait training with head turns       Patient will benefit from skilled therapeutic intervention in order to improve the following deficits and impairments:     Visit Diagnosis: Other abnormalities of gait and mobility     Problem List Patient Active Problem List   Diagnosis Date Noted  . Chest pain at  rest 08/24/2018  . Arthralgia 08/01/2017  . COPD with asthma ([place]) 05/04/2017  . Angina pectoris ([place]) 01/05/2017  . Insomnia 11/23/2016  . Chronic nonintractable headache 11/23/2016  . Pancreatic insufficiency 10/28/2016  . Carotid artery disease ([place]) 05/19/2016  . Fatigue 05/19/2016  . Anemia 03/27/2016  . Acute bronchitis with COPD ([place]) 12/05/2015  . Type 1 diabetes mellitus ([place]) 07/21/2015  . B12 deficiency 03/31/2015  . OSA (obstructive sleep apnea) with hypoxia 11/20/2014  . Fecal incontinence 08/28/2014  . 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 Falls 08/28/2014  . Neck pain of over 3 months duration 08/28/2014  . H/O total knee replacement 02/20/2014  . Obesity (BMI 30-39.9) 11/12/2013  . Chronic diastolic CHF (congestive heart failure) ([place]) 11/17/2012  .  Meniscus, lateral, anterior horn derangement 11/10/2011  . Medial meniscus, posterior horn derangement 11/10/2011  . Osteoarthritis of right knee 11/10/2011  . Vertigo 10/05/2011  . Labile hypertension 10/05/2011  . CAD (coronary artery disease) 08/27/2010  . MUSCLE CRAMPS 12/29/2009  . History of myocardial infarction 11/19/2009  . Extrinsic asthma 11/19/2009  . GERD 11/19/2009  . Cough 11/19/2009  . Hyperlipidemia 11/18/2009    DildayJenness Corner, PT 01/17/2019, 8:35 PM  [facility] [address] [place], [place], [address] Phone: [phone]   Fax:  [phone]  Name: [name] MRN: [id number] Date of Birth: 1940-05-21

## 2019-01-18 ENCOUNTER — Ambulatory Visit: Payer: Medicare Other | Admitting: Physical Therapy

## 2019-01-23 ENCOUNTER — Ambulatory Visit: Payer: Medicare Other | Admitting: Physical Therapy

## 2019-01-23 ENCOUNTER — Other Ambulatory Visit: Payer: Self-pay

## 2019-01-23 DIAGNOSIS — R2689 Other abnormalities of gait and mobility: Secondary | ICD-10-CM

## 2019-01-23 NOTE — Therapy (Signed)
[facility] [address] [place], [place], [address] Phone: [phone]   Fax:  [phone]  Patient Details  Name: [name] MRN: [id number] Date of Birth: Jun 14, 1940 Referring Provider:  Binnie Rail, MD  Encounter Date: 01/23/2019   Pt arrives for PT appt - antalgic gait pattern due to Rt hip and RLE pain; states she wanted to come tell me in person that the pain is no better than it was last week (on 01-16-19 session) - is unable to tolerate standing/balance exercises.  Pt states she has spoken to her MD via phone but has in person appt scheduled on Thursday, 01-25-19.  Informed pt that she will be placed on hold until appt MD appt to know diagnosis and plan for treatment after diagnosis has been made.  Informed pt that I will call her Friday am to determine if she should be discharged at this time or placed on hold to resume when pain decides - informed her to discuss this with her MD.  Pt verbalized understanding and agreement.  Alda Lea, PT 01/23/2019, 12:13 PM  [facility] [address] [place], [place], [address] Phone: [phone]   Fax:  [phone]

## 2019-01-30 ENCOUNTER — Other Ambulatory Visit: Payer: Self-pay | Admitting: Internal Medicine

## 2019-01-30 ENCOUNTER — Ambulatory Visit: Payer: Medicare Other | Admitting: Nutrition

## 2019-02-06 ENCOUNTER — Encounter: Payer: Medicare Other | Attending: Internal Medicine | Admitting: Nutrition

## 2019-02-06 ENCOUNTER — Other Ambulatory Visit: Payer: Self-pay

## 2019-02-06 DIAGNOSIS — E1065 Type 1 diabetes mellitus with hyperglycemia: Secondary | ICD-10-CM | POA: Diagnosis present

## 2019-02-06 LAB — HM DIABETES EYE EXAM

## 2019-02-06 NOTE — Progress Notes (Signed)
Patient is here today to review her diet and blood sugar readings. She is on a Libre, but did not bring the scanner, and did not bring her meter. Typical day:  5AM awake, but reads in bed until 9:30AM 9:30AM out of bed, Blood sugar: 197-300. Take Humalog 18u  Will adjust dose for variable reads, but not for varying amounts of carbohydrate in the meal. 9:45 Bfast:  1 toast, 1 egg boiled, 1 piece of [place] bacon, coffee with 1 tsp sugar and cream, or 2 pancakes with no egg or protein.   1:30 piece of fruit ie: orang, apple or pair:          No insulin for this.  Or will have a salad with [place] or cheese, no crackers  Hot tea to drink, or gatorade 4 ounces 6-7PM:  Blood sugar: 225-400.  Takes 5-10u of Humalog.  Meal: 4 ounces of protein, usuasally baked, 1-3 green non starchy veg.,with water to drink,  And once a week with drink 1 glass of sweetened  Tea   10PM: 3-4X/wk, hand full of nuts or 2-3  peanut butter crackers Problems identified: 1.  No basal insulin 2.  Not adjusting Humalog for large variation in carbohydrate portions  for meals 3.  No Humalog coverage for lunch, or HS snacking.  -Discussed the idea of a balanced meal, making sure that she has protein, carbs and fats in each meal, and the idea of consistency with the amounts of carbs for more consistent blood sugar readings -Discussed the idea the progression of her diabetes, the idea of basal bolus insulin, and the fact that the amount of Humalog needed for meal time insulin, varies with the amount of carbs eaten.  She reported good understandung of this.  She was shown the difference in carbohydrate portions eaten at breakfast and lunch time meals, which results in  variable  blood sugars  acL and acS.  - She was given a list of carbohydrate foods, and the fact that these foods are not the enemy, but require more insulin for them.  She reported good understanding of this.   -Also discussed the need to test blood sugars ac and HS and  what those readings tell her with respect to meal time insulin coverage from the previous meal eaten.  She reported good understanding of this. - She asked about a insulin pump, and I explained the advantages/disadvantages of pump therapy.  Cost is an issue with her.   -She was shown the V-Go and she liked this.  She was given a brochure to review and discuss with her doctor.   She had no final questions for me, and reported gratatude for information given.  She was given my number to call if questions arise later.   -

## 2019-02-09 NOTE — Patient Instructions (Addendum)
  Blood work was ordered.     You had a B12 injection today.   Medications reviewed and updated.  Changes include :   Stop the lansoprazole and started aciphex daily.  Your prescription(s) have been submitted to your pharmacy. Please take as directed and contact our office if you believe you are having problem(s) with the medication(s).     Please followup in 6 months

## 2019-02-09 NOTE — Progress Notes (Signed)
Subjective:    Patient ID: [name], female    DOB: [birth year], 79 y.o.   MRN: [id number]  HPI The patient is here for follow up.  She is not exercising regularly.    CAD, Hypertension: She is taking her medication daily. She is compliant with a low sodium diet.   She does monitor her blood pressure at home - sometimes still high 180/89 - about 3/week.  She was on steroids recently, but is off now.    Diabetes: she sees Dr Chalmers Cater.  She is taking her medication daily as prescribed. She is fairly compliant with a diabetic diet.    Hyperlipidemia: She is taking her medication daily. She is compliant with a low fat/cholesterol diet. She denies myalgias.   GERD:  She is taking her medication daily as prescribed.  She has GERD symptoms nightly.    B12 def: she gets monthly B12 injections.  She needs an injection today.    Insomnia:  She takes nortriptyline nightly.     Medications and allergies reviewed with patient and updated if appropriate.  Patient Active Problem List   Diagnosis Date Noted  . Chest pain at rest 08/24/2018  . Arthralgia 08/01/2017  . COPD with asthma ([place]) 05/04/2017  . Angina pectoris ([place]) 01/05/2017  . Insomnia 11/23/2016  . Chronic nonintractable headache 11/23/2016  . Pancreatic insufficiency 10/28/2016  . Carotid artery disease ([place]) 05/19/2016  . Fatigue 05/19/2016  . Anemia 03/27/2016  . Acute bronchitis with COPD ([place]) 12/05/2015  . Type 1 diabetes mellitus ([place]) 07/21/2015  . B12 deficiency 03/31/2015  . OSA (obstructive sleep apnea) with hypoxia 11/20/2014  . Fecal incontinence 08/28/2014  . 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 Falls 08/28/2014  . Neck pain of over 3 months duration 08/28/2014  . H/O total knee replacement 02/20/2014  . Obesity (BMI 30-39.9) 11/12/2013  . Chronic diastolic CHF (congestive heart failure) ([place]) 11/17/2012  . Meniscus, lateral, anterior horn derangement 11/10/2011  . Medial meniscus, posterior horn  derangement 11/10/2011  . Osteoarthritis of right knee 11/10/2011  . Vertigo 10/05/2011  . Labile hypertension 10/05/2011  . CAD (coronary artery disease) 08/27/2010  . MUSCLE CRAMPS 12/29/2009  . History of myocardial infarction 11/19/2009  . Extrinsic asthma 11/19/2009  . GERD 11/19/2009  . Cough 11/19/2009  . Hyperlipidemia 11/18/2009    Current Outpatient Medications on File Prior to Visit  Medication Sig Dispense Refill  . albuterol (PROVENTIL HFA;VENTOLIN HFA) 108 (90 BASE) MCG/ACT inhaler Inhale 2 puffs into the lungs every 6 (six) hours as needed. For shortness of breath. 1 Inhaler 0  . albuterol (PROVENTIL) (2.5 MG/3ML) 0.083% nebulizer solution USE 1 VIAL IN NEBULIZER EVERY 6 HOURS - and as needed 360 mL 1  . alum & mag hydroxide-simeth (MAALOX/MYLANTA) 200-200-20 MG/5ML suspension Take 30 mLs as needed by mouth for indigestion or heartburn.    Marland Kitchen aspirin EC 81 MG tablet Take 1 tablet (81 mg total) by mouth daily.    . baclofen (LIORESAL) 20 MG tablet Take 1 tablet (20 mg total) by mouth 3 (three) times daily. *Keep 02/11/19 Office visit* 90 tablet 0  . Blood Glucose Monitoring Suppl (ONE TOUCH ULTRA MINI) w/Device KIT 3 (three) times daily. for testing  0  . calcium carbonate (TUMS - DOSED IN MG ELEMENTAL CALCIUM) 500 MG chewable tablet Chew 2 tablets daily as needed by mouth for indigestion or heartburn.    . Cholecalciferol (VITAMIN D) 2000 UNITS tablet Take 2,000 Units by mouth daily.     Marland Kitchen  clonazePAM (KLONOPIN) 0.5 MG tablet Take 0.5 mg daily by mouth.    . CREON 24000-76000 units CPEP TAKE 2 CAPSULES BY MOUTH 3 TIMES A DAY WITH MEALS (TAK 1 CAPSULE WITH A SNACK) 210 capsule 1  . diclofenac sodium (VOLTAREN) 1 % GEL Apply 2 g topically 4 (four) times daily as needed (muscle pain).    Marland Kitchen diltiazem (CARDIZEM CD) 300 MG 24 hr capsule Take 1 capsule (300 mg total) by mouth daily. 90 capsule 1  . ezetimibe (ZETIA) 10 MG tablet Take 10 mg by mouth daily.  6  . famotidine (PEPCID) 40  MG tablet Take 1 tablet (40 mg total) by mouth daily as needed. 90 tablet 1  . fexofenadine (ALLEGRA) 180 MG tablet Take 1 tablet (180 mg total) by mouth daily. 30 tablet 6  . fluticasone (FLONASE) 50 MCG/ACT nasal spray Place 2 sprays into both nostrils daily. 16 g 6  . Fluticasone-Salmeterol (ADVAIR DISKUS) 250-50 MCG/DOSE AEPB Inhale 1 puff into the lungs 2 (two) times daily. 60 each 5  . furosemide (LASIX) 40 MG tablet Take 1 tablet (40 mg total) by mouth daily. 90 tablet 3  . insulin lispro protamine-insulin lispro (HUMALOG 75/25) (75-25) 100 UNIT/ML SUSP Inject 15-22 Units into the skin See admin instructions. Takes 16 units in the morning, 10 units at lunch, and 25  units with supper    . isosorbide mononitrate (IMDUR) 60 MG 24 hr tablet Take 1.5 tablets (90 mg total) by mouth daily. 135 tablet 1  . lansoprazole (PREVACID) 30 MG capsule TAKE 1 CAPSULE BY MOUTH TWICE A DAY BEFORE A MEAL 180 capsule 0  . loperamide (IMODIUM) 2 MG capsule Take 2 mg by mouth as needed for diarrhea or loose stools.     . meclizine (ANTIVERT) 25 MG tablet Take 1 tablet (25 mg total) by mouth 2 (two) times daily as needed for dizziness. 30 tablet 2  . metoprolol succinate (TOPROL-XL) 100 MG 24 hr tablet Take 1 tablet (100 mg total) by mouth daily. 30 tablet 11  . montelukast (SINGULAIR) 5 MG chewable tablet CHEW 1 TABLET (5 MG TOTAL) BY MOUTH AT BEDTIME. 90 tablet 1  . Multiple Vitamin (MULTIVITAMIN WITH MINERALS) TABS Take 1 tablet by mouth daily.    . Nebulizers (COMPRESSOR/NEBULIZER) MISC Use with albuterol 1 each 0  . nitroGLYCERIN (NITROSTAT) 0.4 MG SL tablet Place 0.4 mg under the tongue every 5 (five) minutes as needed for chest pain.    . nortriptyline (PAMELOR) 75 MG capsule TAKE 1 CAPSULE (75 MG TOTAL) BY MOUTH AT BEDTIME. 90 capsule 2  . ONE TOUCH ULTRA TEST test strip CHECK BLOOD SUGAR TWICE A DAY DX: E11.9  3  . ONETOUCH DELICA LANCETS 35K MISC 3 (three) times daily. for testing  0  .  Spacer/Aero-Holding Chambers (AEROCHAMBER MV) inhaler Use as instructed 1 each 0  . Tiotropium Bromide Monohydrate (SPIRIVA RESPIMAT) 2.5 MCG/ACT AERS Inhale 2 puffs into the lungs daily. 4 g 6   Current Facility-Administered Medications on File Prior to Visit  Medication Dose Route Frequency Provider Last Rate Last Admin  . cyanocobalamin ((VITAMIN B-12)) injection 1,000 mcg  1,000 mcg Intramuscular Q30 days Binnie Rail, MD   1,000 mcg at 03/26/16 1658    Past Medical History:  Diagnosis Date  . Adenomatous colon polyp   . Allergy   . Anxiety   . Asthma   . Carotid artery disease ([place])    carotid US 02/2017: bilat ICA 1-39%  . Chronic diastolic  CHF    Echo 02/2017: EF 65-70, Gr 2 DD, mild MS (mean 5), PASP 44  . COPD    pt is unsure if has been officially diagnosed  . Coronary artery disease    CABG '09- cathed 12/09, 9/10, 6/11, 3/14 and 12/13/16- medical Rx // cath 12/2016 - 2/3 grafts patent >> med Rx // Myoview 12/17: low risk  . Diabetes mellitus   . Gastroesophageal reflux disease   . Hiatal hernia   . 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 Hyperlipidemia   . Hypertension   . PONV (postoperative nausea and vomiting)   . Schatzki's ring   . Shoulder injury    resolved after shoulder surgery  . Sleep apnea    not on cpap    Past Surgical History:  Procedure Laterality Date  . ABDOMINAL HYSTERECTOMY    . APPENDECTOMY     came out with Hysterectomy  . CARDIAC CATHETERIZATION  07/23/2009   EF 60%  . CARDIAC CATHETERIZATION  10/11/2008  . CARDIAC CATHETERIZATION  03/01/2007   EF 75-80%  . CARDIOVASCULAR STRESS TEST  11/15/2007   EF 60%  . COLONOSCOPY    . CORONARY ARTERY BYPASS GRAFT     SEVERELY DISEASED SAPHENOUS VEIN GRAFT TO THE RIGHT CORONARY ARTERY BUT WITH FAIRLY WELL PRESERVED FLOW TO THE DISTAL RIGHT CORONARY ARTERY FROM THE NATIVE CIRCULATION-RESTART  CATH IN JUNE 2000, REVEALS MILD/MODERATE  CAD WITH GOOD FLOW DOWN HER LAD  .  ESOPHAGOGASTRODUODENOSCOPY    . EYE SURGERY     bilateral cataract surgery with lens implant  . LEFT HEART CATH AND CORS/GRAFTS ANGIOGRAPHY N/A 12/14/2016   Procedure: LEFT HEART CATH AND CORS/GRAFTS ANGIOGRAPHY;  Surgeon: Jettie Booze, MD;  Location: [facility];  Service: Cardiovascular;  Laterality: N/A;  . lense removal Left   . POLYPECTOMY    . ROTATOR CUFF REPAIR     right and left  . TONSILLECTOMY     age 81  . 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 tumor removed kidney    . UPPER GASTROINTESTINAL ENDOSCOPY    . US ECHOCARDIOGRAPHY  03/08/2008   EF 55-60%    Social History   Socioeconomic History  . Marital status: Widowed    Spouse name: Not on file  . Number of children: 4  . Years of education: Designer, jewellery  . Highest education level: Doctorate  Occupational History  . Occupation: Retired  Tobacco Use  . Smoking status: Former Smoker    Packs/day: 0.50    Years: 21.00    Pack years: 10.50    Types: Cigarettes    Start date: 02/09/1955    Quit date: 02/09/1976    Years since quitting: 43.0  . Smokeless tobacco: Never Used  Substance and Sexual Activity  . Alcohol use: No    Alcohol/week: 0.0 standard drinks  . Drug use: No  . Sexual activity: Never  Other Topics Concern  . Not on file  Social History Narrative   Lives alone.  One story home.  Has 4 children.  Education: doctorate in theology.    Caffeine use: Drinks 1 cup coffee/day      Originally from [place]. Previously has lived in [place]. Prior travel to [place], [place], [place], [place], [place], [place], [place], & [place]. Previously worked in [employer] She has  a dog currently. No bird, mold, or hot tub exposure. She also pastors a church.    Social Determinants of Health   Financial Resource Strain:   . Difficulty of Paying Living Expenses: Not on file   Food Insecurity:   . Worried About Charity fundraiser in the Last Year: Not on file  . Ran Out of Food in the Last Year: Not on file  Transportation Needs:   . Lack of Transportation (Medical): Not on file  . Lack of Transportation (Non-Medical): Not on file  Physical Activity:   . Days of Exercise per Week: Not on file  . Minutes of Exercise per Session: Not on file  Stress:   . Feeling of Stress : Not on file  Social Connections:   . Frequency of Communication with Friends and Family: Not on file  . Frequency of Social Gatherings with Friends and Family: Not on file  . Attends Religious Services: Not on file  . Active Member of Clubs or Organizations: Not on file  . Attends Archivist Meetings: Not on file  . Marital Status: Not on file    Family History  Problem Relation Age of Onset  . Heart disease Maternal Grandfather   . Heart failure Maternal Grandfather   . Diabetes Maternal Grandfather   . Heart attack Father   . Diabetes Mother   . Rheum arthritis Sister   . Emphysema Paternal Uncle   . Esophageal cancer Brother 29       she said he was born with it  . Emphysema Paternal Aunt   . Healthy Child   . Neuropathy Neg Hx   . Multiple sclerosis Neg Hx   . Colon cancer Neg Hx   . Colon polyps Neg Hx   . Rectal cancer Neg Hx   . Stomach cancer Neg Hx     Review of Systems  Constitutional: Negative for chills and fever.  Respiratory: Positive for shortness of breath and wheezing (with weather changes). Negative for cough.   Cardiovascular: Positive for chest pain (not cardiac in nature) and leg swelling. Negative for palpitations.  Gastrointestinal: Positive for nausea. Negative for abdominal pain.       GERD daily  Neurological: Positive for headaches. Negative for light-headedness.       Objective:   Vitals:   02/12/19 1344  BP: 134/72  Resp: 16  Temp: 98.5 F (36.9 C)   BP Readings from Last 3 Encounters:  02/12/19 134/72  01/12/19 (!)  167/97  01/11/19 (!) 151/95   Wt Readings from Last 3 Encounters:  02/12/19 205 lb (93 kg)  01/12/19 189 lb (85.7 kg)  01/09/19 204 lb (92.5 kg)   Body mass index is 37.49 kg/m.   Physical Exam    Constitutional: Appears well-developed and well-nourished. No distress.  HENT:  Head: Normocephalic and atraumatic.  Neck: Neck supple. No tracheal deviation present. No thyromegaly present.  No cervical lymphadenopathy Cardiovascular: Normal rate, regular rhythm and normal heart sounds.   No murmur heard. No carotid bruit .  No edema Pulmonary/Chest: Effort normal and breath sounds normal. No respiratory distress. No has no wheezes. No rales.  Skin: Skin is warm and dry. Not diaphoretic.  Psychiatric: Normal mood and affect. Behavior is normal.      Assessment & Plan:    See Problem List for Assessment and Plan of chronic medical problems.     This visit occurred during the SARS-CoV-2 public health emergency.  Safety protocols were in  place, including screening questions prior to the visit, additional usage of staff PPE, and extensive cleaning of exam room while observing appropriate contact time as indicated for disinfecting solutions.

## 2019-02-12 ENCOUNTER — Ambulatory Visit (INDEPENDENT_AMBULATORY_CARE_PROVIDER_SITE_OTHER): Payer: Medicare Other | Admitting: Internal Medicine

## 2019-02-12 ENCOUNTER — Other Ambulatory Visit: Payer: Self-pay

## 2019-02-12 ENCOUNTER — Encounter: Payer: Self-pay | Admitting: Internal Medicine

## 2019-02-12 VITALS — BP 134/72 | Temp 98.5°F | Resp 16 | Ht 62.0 in | Wt 205.0 lb

## 2019-02-12 DIAGNOSIS — I25709 Atherosclerosis of coronary artery bypass graft(s), unspecified, with unspecified angina pectoris: Secondary | ICD-10-CM

## 2019-02-12 DIAGNOSIS — E785 Hyperlipidemia, unspecified: Secondary | ICD-10-CM | POA: Diagnosis not present

## 2019-02-12 DIAGNOSIS — E538 Deficiency of other specified B group vitamins: Secondary | ICD-10-CM

## 2019-02-12 DIAGNOSIS — K219 Gastro-esophageal reflux disease without esophagitis: Secondary | ICD-10-CM | POA: Diagnosis not present

## 2019-02-12 DIAGNOSIS — R0989 Other specified symptoms and signs involving the circulatory and respiratory systems: Secondary | ICD-10-CM | POA: Diagnosis not present

## 2019-02-12 DIAGNOSIS — G4709 Other insomnia: Secondary | ICD-10-CM

## 2019-02-12 DIAGNOSIS — E1059 Type 1 diabetes mellitus with other circulatory complications: Secondary | ICD-10-CM | POA: Diagnosis not present

## 2019-02-12 DIAGNOSIS — R252 Cramp and spasm: Secondary | ICD-10-CM

## 2019-02-12 LAB — COMPREHENSIVE METABOLIC PANEL
ALT: 9 U/L (ref 0–35)
AST: 12 U/L (ref 0–37)
Albumin: 3.9 g/dL (ref 3.5–5.2)
Alkaline Phosphatase: 133 U/L — ABNORMAL HIGH (ref 39–117)
BUN: 17 mg/dL (ref 6–23)
CO2: 29 mEq/L (ref 19–32)
Calcium: 9.3 mg/dL (ref 8.4–10.5)
Chloride: 102 mEq/L (ref 96–112)
Creatinine, Ser: 0.93 mg/dL (ref 0.40–1.20)
GFR: 70.12 mL/min (ref >60.00)
Glucose, Bld: 154 mg/dL — ABNORMAL HIGH (ref 70–99)
Potassium: 4.4 mEq/L (ref 3.5–5.1)
Sodium: 141 mEq/L (ref 135–145)
Total Bilirubin: 0.2 mg/dL (ref 0.2–1.2)
Total Protein: 7.3 g/dL (ref 6.0–8.3)

## 2019-02-12 LAB — CBC WITH DIFFERENTIAL/PLATELET
Basophils Absolute: 0.1 10*3/uL (ref 0.0–0.1)
Basophils Relative: 0.9 % (ref 0.0–3.0)
Eosinophils Absolute: 0.1 10*3/uL (ref 0.0–0.7)
Eosinophils Relative: 1.8 % (ref 0.0–5.0)
HCT: 36.6 % (ref 36.0–46.0)
Hemoglobin: 11.3 g/dL — ABNORMAL LOW (ref 12.0–15.0)
Lymphocytes Relative: 25.9 % (ref 12.0–46.0)
Lymphs Abs: 1.8 10*3/uL (ref 0.7–4.0)
MCHC: 30.9 g/dL (ref 30.0–36.0)
MCV: 79.2 fl (ref 78.0–100.0)
Monocytes Absolute: 0.5 10*3/uL (ref 0.1–1.0)
Monocytes Relative: 7.4 % (ref 3.0–12.0)
Neutro Abs: 4.5 10*3/uL (ref 1.4–7.7)
Neutrophils Relative %: 64 % (ref 43.0–77.0)
Platelets: 309 10*3/uL (ref 150.0–400.0)
RBC: 4.63 Mil/uL (ref 3.87–5.11)
RDW: 16.4 % — ABNORMAL HIGH (ref 11.5–15.5)
WBC: 7.1 10*3/uL (ref 4.0–10.5)

## 2019-02-12 LAB — LIPID PANEL
Cholesterol: 199 mg/dL (ref 0–200)
HDL: 35.6 mg/dL — ABNORMAL LOW (ref >39.00)
LDL Cholesterol: 131 mg/dL — ABNORMAL HIGH (ref 0–99)
NonHDL: 163.01
Total CHOL/HDL Ratio: 6
Triglycerides: 161 mg/dL — ABNORMAL HIGH (ref 0.0–149.0)
VLDL: 32.2 mg/dL (ref 0.0–40.0)

## 2019-02-12 LAB — TSH: TSH: 0.76 u[IU]/mL (ref 0.35–4.50)

## 2019-02-12 LAB — HEMOGLOBIN A1C: Hgb A1c MFr Bld: 9.2 % — ABNORMAL HIGH (ref 4.6–6.5)

## 2019-02-12 MED ORDER — CYANOCOBALAMIN 1000 MCG/ML IJ SOLN
1000.0000 ug | Freq: Once | INTRAMUSCULAR | Status: AC
  Administered 2019-02-12: 1000 ug via INTRAMUSCULAR

## 2019-02-12 MED ORDER — RABEPRAZOLE SODIUM 20 MG PO TBEC: 20.0000 mg | DELAYED_RELEASE_TABLET | Freq: Every day | ORAL | 5 refills | Status: DC

## 2019-02-12 NOTE — Assessment & Plan Note (Signed)
B12 injections monthly Will give injection today

## 2019-02-12 NOTE — Assessment & Plan Note (Signed)
Not controlled D/c lansoprazole - wants to try medication only once a day Start aciphex daily Continue pepcid daily If GERD does not improve will need to see GI

## 2019-02-12 NOTE — Assessment & Plan Note (Signed)
Taking baclofen three times a day Controlled, chronic Gets muscle cramping only occasionally

## 2019-02-12 NOTE — Assessment & Plan Note (Signed)
Statin intolerant On zetia Lipids, cmp Encouraged regular exercise

## 2019-02-12 NOTE — Assessment & Plan Note (Signed)
Taking nortipyline

## 2019-02-12 NOTE — Assessment & Plan Note (Signed)
Labile, occasionally high at home BP looks good today Continue current medications Monitor at home cmp

## 2019-02-12 NOTE — Assessment & Plan Note (Signed)
Following with Dr Chalmers Cater Has brittle DM - very difficult to control Encouraged regular exercise

## 2019-02-12 NOTE — Assessment & Plan Note (Addendum)
Chronic, stable  chest pain - not cardiac, no palpitations Follows with cardiology continue current medications Cbc, cmp, lipid

## 2019-02-14 ENCOUNTER — Other Ambulatory Visit: Payer: Self-pay | Admitting: Internal Medicine

## 2019-02-14 NOTE — Patient Instructions (Addendum)
Add protein to all meals.  Make sure the amounts of carbohydrates eaten at each meal is consistent. Take insulin as directed for lunch time meal/snack Discuss with your doctor about the possible need for basal insulin, and possible V-Go insulin delivery device Call if questions.

## 2019-02-21 ENCOUNTER — Ambulatory Visit: Payer: Medicare Other | Admitting: *Deleted

## 2019-03-05 ENCOUNTER — Encounter: Payer: Self-pay | Admitting: Internal Medicine

## 2019-03-05 ENCOUNTER — Ambulatory Visit (INDEPENDENT_AMBULATORY_CARE_PROVIDER_SITE_OTHER): Payer: Medicare Other | Admitting: Internal Medicine

## 2019-03-05 DIAGNOSIS — J440 Chronic obstructive pulmonary disease with acute lower respiratory infection: Secondary | ICD-10-CM

## 2019-03-05 DIAGNOSIS — Z0289 Encounter for other administrative examinations: Secondary | ICD-10-CM

## 2019-03-05 DIAGNOSIS — J209 Acute bronchitis, unspecified: Secondary | ICD-10-CM | POA: Diagnosis not present

## 2019-03-05 MED ORDER — PREDNISONE 10 MG PO TABS: ORAL_TABLET | ORAL | 0 refills | Status: DC

## 2019-03-05 MED ORDER — DOXYCYCLINE HYCLATE 100 MG PO TABS: 100.0000 mg | ORAL_TABLET | Freq: Two times a day (BID) | ORAL | 0 refills | Status: DC

## 2019-03-05 MED ORDER — BENZONATATE 200 MG PO CAPS: 200.0000 mg | ORAL_CAPSULE | Freq: Three times a day (TID) | ORAL | 1 refills | Status: DC | PRN

## 2019-03-05 NOTE — Assessment & Plan Note (Signed)
Acute-symptoms ongoing for at least 2 weeks Productive cough with discolored sputum, shortness of breath, wheezing, fever of up to 101, decreased appetite She has had this in the past and is low risk for Covid so we will treat as bacterial bronchitis with COPD exacerbation  Doxycycline twice daily x10 days Prednisone taper Tessalon Perles Continue inhalers, nebulizer treatments  Advised to call me or pulmonary if her symptoms are not improving or getting any worse.  Discussed that there is a clinic we can have her go to if needed to evaluated in person

## 2019-03-05 NOTE — Progress Notes (Signed)
Virtual Visit via telephone note  I connected with [name] on 03/05/19 at  1:30 PM EST by telephone and verified that I am speaking with the correct person using two identifiers.   I discussed the limitations of evaluation and management by telemedicine and the availability of in person appointments. The patient expressed understanding and agreed to proceed.  Present for the visit:  Myself, Dr Billey Gosling, [name].  The patient is currently at home and I am in the office.    No referring provider.    History of Present Illness: This visit is an acute visit for cold symptoms.  Her symptoms started two weeks ago.  She had self diagnosed acute bronchitis and she was coughing up gray sputum, she was swimmy headed and she has decreased appetite.  She did not see anyone for this.  She has had this so often that she knew pretty much what it was.  She was using her inhaler, nebulizer machine and taking tessalon perles.    The congestion in her chest is still there.  She is still coughing up discolored sputum.  After she coughs a couple of times she is out of breath, has chest pain and headaches.  She is using the neb machine 4-5 times a day and it helps temporarily.  This is what she usually has.  She feels she is low risk for covid.  She is not eating or drinking much.    Review of Systems  Constitutional: Positive for fever (temp of 101 once) and malaise/fatigue.       Decreased appetite  HENT: Negative for congestion, ear pain, sinus pain and sore throat.        Taste intact  Respiratory: Positive for cough, sputum production, shortness of breath and wheezing.   Cardiovascular: Positive for chest pain (after coughing).  Gastrointestinal: Negative for diarrhea and nausea.  Musculoskeletal: Negative for myalgias.  Neurological: Positive for dizziness (vertigo) and headaches (with coughing).      Social History   Socioeconomic History  . Marital status: Widowed    Spouse name:  Not on file  . Number of children: 4  . Years of education: Designer, jewellery  . Highest education level: Doctorate  Occupational History  . Occupation: Retired  Tobacco Use  . Smoking status: Former Smoker    Packs/day: 0.50    Years: 21.00    Pack years: 10.50    Types: Cigarettes    Start date: 02/09/1955    Quit date: 02/09/1976    Years since quitting: 43.0  . Smokeless tobacco: Never Used  Substance and Sexual Activity  . Alcohol use: No    Alcohol/week: 0.0 standard drinks  . Drug use: No  . Sexual activity: Never  Other Topics Concern  . Not on file  Social History Narrative   Lives alone.  One story home.  Has 4 children.  Education: doctorate in theology.    Caffeine use: Drinks 1 cup coffee/day      Originally from [place]. Previously has lived in [place]. Prior travel to [place], [place], [place], [place], [place], [place], [place], & [place]. Previously worked in [employer] She has a dog currently. No bird, mold, or hot tub exposure. She also pastors a church.    Social Determinants of Health   Financial Resource Strain:   . Difficulty of Paying Living Expenses: Not on file  Food Insecurity:   . Worried About Charity fundraiser in the Last Year: Not on file  .  Ran Out of Food in the Last Year: Not on file  Transportation Needs:   . Lack of Transportation (Medical): Not on file  . Lack of Transportation (Non-Medical): Not on file  Physical Activity:   . Days of Exercise per Week: Not on file  . Minutes of Exercise per Session: Not on file  Stress:   . Feeling of Stress : Not on file  Social Connections:   . Frequency of Communication with Friends and Family: Not on file  . Frequency of Social Gatherings with Friends and Family: Not on file  . Attends Religious Services: Not on file  . Active Member of Clubs or Organizations: Not on file  . Attends Archivist Meetings: Not on file  . Marital Status: Not on file      Assessment and Plan:  See Problem List for  Assessment and Plan of chronic medical problems.   Follow Up Instructions:    I discussed the assessment and treatment plan with the patient. The patient was provided an opportunity to ask questions and all were answered. The patient agreed with the plan and demonstrated an understanding of the instructions.   The patient was advised to call back or seek an in-person evaluation if the symptoms worsen or if the condition fails to improve as anticipated.  Time spent on telephone: 15 minutes  Binnie Rail, MD

## 2019-03-07 ENCOUNTER — Ambulatory Visit: Payer: Self-pay | Admitting: *Deleted

## 2019-03-07 ENCOUNTER — Ambulatory Visit: Payer: Medicare Other | Admitting: Internal Medicine

## 2019-03-07 ENCOUNTER — Other Ambulatory Visit: Payer: Self-pay

## 2019-03-07 ENCOUNTER — Ambulatory Visit (INDEPENDENT_AMBULATORY_CARE_PROVIDER_SITE_OTHER): Payer: Medicare Other | Admitting: Medical

## 2019-03-07 VITALS — BP 140/80 | HR 84 | Temp 98.4°F | Ht 62.0 in | Wt 194.0 lb

## 2019-03-07 DIAGNOSIS — I25709 Atherosclerosis of coronary artery bypass graft(s), unspecified, with unspecified angina pectoris: Secondary | ICD-10-CM

## 2019-03-07 DIAGNOSIS — R0789 Other chest pain: Secondary | ICD-10-CM | POA: Diagnosis not present

## 2019-03-07 DIAGNOSIS — B370 Candidal stomatitis: Secondary | ICD-10-CM | POA: Insufficient documentation

## 2019-03-07 DIAGNOSIS — R059 Cough, unspecified: Secondary | ICD-10-CM

## 2019-03-07 DIAGNOSIS — R0602 Shortness of breath: Secondary | ICD-10-CM | POA: Diagnosis not present

## 2019-03-07 DIAGNOSIS — R05 Cough: Secondary | ICD-10-CM

## 2019-03-07 DIAGNOSIS — E1059 Type 1 diabetes mellitus with other circulatory complications: Secondary | ICD-10-CM

## 2019-03-07 DIAGNOSIS — R42 Dizziness and giddiness: Secondary | ICD-10-CM

## 2019-03-07 DIAGNOSIS — I5032 Chronic diastolic (congestive) heart failure: Secondary | ICD-10-CM

## 2019-03-07 DIAGNOSIS — J449 Chronic obstructive pulmonary disease, unspecified: Secondary | ICD-10-CM

## 2019-03-07 MED ORDER — FLUCONAZOLE 100 MG PO TABS: 100.0000 mg | ORAL_TABLET | Freq: Every day | ORAL | 0 refills | Status: DC

## 2019-03-07 NOTE — Progress Notes (Signed)
[facility]   Subjective:  [name] is a 79 y.o. female who presents for respiratory illness.    PCP:  Binnie Rail, MD  Here for cough, pain in chest.  Sick for 2 weeks.  Been having some pain in chest lats 2 days, very short of breath even from bed to bathroom.  Having headache, dizziness.  Using some vertigo medication.  Chest pain worse with coughing.  Has some nausea, no vomiting, has some mild loose stool.   No body aches, no chills, had fever a few days this week.  Has been drinking a lot of water.  No change in smell or taste.  No sick contacts.   Had negative covid test a few weeks ago before this illness.   Nonsmoker.     Did virtual consult with PCP yesterday, was prescribed Doxycycline, prednisone, tessalon perles, and advised to c/t neb treatments.  No other aggravating or relieving factors.  No other c/o.   Past Medical History:  Diagnosis Date  . Adenomatous colon polyp   . Allergy   . Anxiety   . Asthma   . Carotid artery disease ([place])    carotid US 02/2017: bilat ICA 1-39%  . Chronic diastolic CHF    Echo 123456: EF 65-70, Gr 2 DD, mild MS (mean 5), PASP 44  . COPD    pt is unsure if has been officially diagnosed  . Coronary artery disease    CABG '09- cathed 12/09, 9/10, 6/11, 3/14 and 12/13/16- medical Rx // cath 12/2016 - 2/3 grafts patent >> med Rx // Myoview 12/17: low risk  . Diabetes mellitus   . Gastroesophageal reflux disease   . Hiatal hernia   . 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 Hyperlipidemia   . Hypertension   . PONV (postoperative nausea and vomiting)   . Schatzki's ring   . Shoulder injury    resolved after shoulder surgery  . Sleep apnea    not on cpap    ROS as in subjective   Objective: BP 140/80   Pulse 84   Temp 98.4 F (36.9 C)   Ht 5\' 2"  (1.575 m)   Wt 194 lb (88 kg)   LMP  (LMP Unknown)   BMI 35.48 kg/m     Wt Readings from Last 3 Encounters:  03/07/19 194 lb (88 kg)  02/12/19 205 lb  (93 kg)  01/12/19 189 lb (85.7 kg)    General appearance: Alert, WD/WN, no distress, somewhat fatigued appearing                             Skin: warm, no rash                           Head: no sinus tenderness                            Eyes: conjunctiva normal, corneas clear, PERRLA                        Nose: septum midline, turbinates swollen, with erythema and clear discharge             Mouth/throat: somewhat dry MM, tongue normal, mild pharyngeal erythema, +whitish patches and debris along inside of buccal mucosa and tongue suggestive of  thrush                           Neck: supple, no adenopathy, no thyromegaly, non tender                          Heart: RRR, normal S1, S2, no murmurs                         Lungs: decreased sounds in general, +crackles throughout , no wheezing Ext: no edema, no calve asymmetry or tendnerss, -homans        Assessment  Encounter Diagnoses  Name Primary?  . SOB (shortness of breath) Yes  . Cough   . Chest discomfort   . Dizziness   . Chronic diastolic CHF (congestive heart failure) ([place])   . Coronary artery disease involving coronary bypass graft of native heart with angina pectoris ([place])   . COPD with asthma ([place])   . 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 Thrush       Plan: Discussed symptoms, concerns, possible causes.  Of note, oxygen level unable to assess with oximetry due to particularly long fingernails  Exam suggest thrush.   She hasn't been ruled out for covid.  Labs and swab this evening.   differential includes dehydration, covid pneumonia, COPD bronchitis, CHF exacerbation, but more likely COPD flare and dehydration.   EKG machine was not functioning properly tonight, so unable to get EKG.   We discussed the following:   Recommendations:  Go for chest xray tomorrow at [facility] after [place] well with water and clear fluids  You appear to be a little dehydrated.   Drink clear fluids such as water, no sugar Gatorade, ice chips, soup, to the point your urine is clear  You appear to have thrush or fungus infection in your mouth.  This is likely due to use of your inhalers without rinsing out your mouth with water  Always rinse your mouth out with water a few minutes after using your inhalers  Begin Fluconazole/Diflucan oral tablet for fungal infection/thrush  Your primary care provider prescribed some medications 2 days ago.  Continue those for now along with your regular medications  We will call with lab and xray results  If you are much worse over the next 24-48 hours, then go to the hospital emergency department  Continue other medications recently prescribed by your primary doctor:  Continue doxycycline 100 mg twice daily  Continue prednisone steroid taper by mouth as directed on the label  Continue Tessalon Perles cough drops up to 3 times a day as needed  Continue your albuterol by nebulizer  Continue your Spiriva as normal  Continue Advair twice daily preventative inhaler but make sure you rinse your mouth out with water after use  Continue your other regular medications  Of note you have more than 1 antihistamine listed in your chart record including meclizine and Allegra.  You should only take 1 of these at a time not both.  Patient was advised to call or return if worse or not improving in the next few days.    Patient voiced understanding of diagnosis, recommendations, and treatment plan.  After visit summary given.   [name] was seen today for chest pain only when cough.  Diagnoses and all orders for this visit:  SOB (shortness  of breath) -     EKG 12-Lead -     DG Chest 2 View; Future -     Novel Coronavirus, NAA ([facility]) -     CBC with Differential/Platelet; Future -     B Nat Peptide; Future -     Basic metabolic panel; Future -     Basic metabolic panel -     B Nat Peptide -     CBC with  Differential/Platelet  Cough -     DG Chest 2 View; Future -     Novel Coronavirus, NAA ([facility]) -     CBC with Differential/Platelet; Future -     B Nat Peptide; Future -     Basic metabolic panel; Future -     Basic metabolic panel -     B Nat Peptide -     CBC with Differential/Platelet  Chest discomfort -     EKG 12-Lead -     Novel Coronavirus, NAA ([facility]) -     CBC with Differential/Platelet; Future -     B Nat Peptide; Future -     Basic metabolic panel; Future -     Basic metabolic panel -     B Nat Peptide -     CBC with Differential/Platelet  Dizziness  Chronic diastolic CHF (congestive heart failure) ([facility])  Coronary artery disease involving coronary bypass graft of native heart with angina pectoris ([facility])  COPD with asthma ([facility])  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

## 2019-03-07 NOTE — Telephone Encounter (Signed)
Appointment made with respiratory clinic tonight at [facility]

## 2019-03-07 NOTE — Telephone Encounter (Signed)
Patient is treated for Bronchitis- and she was given prednisone ,cough medication and antibiotic. Patient states she has more chest pain with her cough. Cough medication seems to helping the cough some. Patient is using the inhaler -4-5 times /day. Patient is coughing throughout whole conversation- she needs follow up visit.  Reason for Disposition . [1] Chest pain(s) lasting a few seconds from coughing AND [2] persists > 3 days  Answer Assessment - Initial Assessment Questions 1. LOCATION: "Where does it hurt?"       Chest pain on R with coughing 2. RADIATION: "Does the pain go anywhere else?" (e.g., into neck, jaw, arms, back)     no 3. ONSET: "When did the chest pain begin?" (Minutes, hours or days)      With cough- started last night 4. PATTERN "Does the pain come and go, or has it been constant since it started?"  "Does it get worse with exertion?"      Just with cough 5. DURATION: "How long does it last" (e.g., seconds, minutes, hours)     Takes a few minutes to go way- but it does get better 6. SEVERITY: "How bad is the pain?"  (e.g., Scale 1-10; mild, moderate, or severe)    - MILD (1-3): doesn't interfere with normal activities     - MODERATE (4-7): interferes with normal activities or awakens from sleep    - SEVERE (8-10): excruciating pain, unable to do any normal activities       Yes- patient is unable to do anything 7. CARDIAC RISK FACTORS: "Do you have any history of heart problems or risk factors for heart disease?" (e.g., angina, prior heart attack; diabetes, high blood pressure, high cholesterol, smoker, or strong family history of heart disease)     Hx bypass 8. PULMONARY RISK FACTORS: "Do you have any history of lung disease?"  (e.g., blood clots in lung, asthma, emphysema, birth control pills)     asthma 9. CAUSE: "What do you think is causing the chest pain?"     coughing 10. OTHER SYMPTOMS: "Do you have any other symptoms?" (e.g., dizziness, nausea, vomiting,  sweating, fever, difficulty breathing, cough)       Cough until she gags- gray sputum 11. PREGNANCY: "Is there any chance you are pregnant?" "When was your last menstrual period?"       n/a  Protocols used: CHEST PAIN-A-AH

## 2019-03-07 NOTE — Patient Instructions (Addendum)
Recommendations:  Go for chest xray tomorrow at [facility] after [place] well with water and clear fluids  You appear to be a little dehydrated.  Drink clear fluids such as water, no sugar Gatorade, ice chips, soup, to the point your urine is clear  You appear to have thrush or fungus infection in your mouth.  This is likely due to use of your inhalers without rinsing out your mouth with water  Always rinse your mouth out with water a few minutes after using your inhalers  Begin Fluconazole/Diflucan oral tablet for fungal infection/thrush  Your primary care provider prescribed some medications 2 days ago.  Continue those for now along with your regular medications  We will call with lab and xray results  If you are much worse over the next 24-48 hours, then go to the hospital emergency department  Continue other medications recently prescribed by your primary doctor:  Continue doxycycline 100 mg twice daily  Continue prednisone steroid taper by mouth as directed on the label  Continue Tessalon Perles cough drops up to 3 times a day as needed  Continue your albuterol by nebulizer  Continue your Spiriva as normal  Continue Advair twice daily preventative inhaler but make sure you rinse your mouth out with water after use  Continue your other regular medications  Of note you have more than 1 antihistamine listed in your chart record including meclizine and Allegra.  You should only take 1 of these at a time not both.

## 2019-03-08 ENCOUNTER — Ambulatory Visit (HOSPITAL_COMMUNITY)
Admission: RE | Admit: 2019-03-08 | Discharge: 2019-03-08 | Disposition: A | Discharge status: Home / Self Care | Payer: Medicare Other | Source: Ambulatory Visit | Attending: Medical | Admitting: Medical

## 2019-03-08 ENCOUNTER — Other Ambulatory Visit: Payer: Self-pay

## 2019-03-08 DIAGNOSIS — R05 Cough: Secondary | ICD-10-CM | POA: Insufficient documentation

## 2019-03-08 DIAGNOSIS — R059 Cough, unspecified: Secondary | ICD-10-CM

## 2019-03-08 DIAGNOSIS — R0602 Shortness of breath: Secondary | ICD-10-CM | POA: Insufficient documentation

## 2019-03-08 LAB — SPECIMEN STATUS

## 2019-03-08 LAB — NOVEL CORONAVIRUS, NAA: SARS-CoV-2, NAA: NOT DETECTED

## 2019-03-09 ENCOUNTER — Telehealth: Payer: Self-pay

## 2019-03-09 LAB — CBC WITH DIFFERENTIAL/PLATELET
Basophils Absolute: 0 10*3/uL (ref 0.0–0.2)
Basos: 0 %
EOS (ABSOLUTE): 0 10*3/uL (ref 0.0–0.4)
Eos: 0 %
Hematocrit: 37.5 % (ref 34.0–46.6)
Hemoglobin: 11.9 g/dL (ref 11.1–15.9)
Immature Grans (Abs): 0.1 10*3/uL (ref 0.0–0.1)
Immature Granulocytes: 1 %
Lymphocytes Absolute: 1.1 10*3/uL (ref 0.7–3.1)
Lymphs: 10 %
MCH: 24.8 pg — ABNORMAL LOW (ref 26.6–33.0)
MCHC: 31.7 g/dL (ref 31.5–35.7)
MCV: 78 fL — ABNORMAL LOW (ref 79–97)
Monocytes Absolute: 0.4 10*3/uL (ref 0.1–0.9)
Monocytes: 4 %
Neutrophils Absolute: 8.7 10*3/uL — ABNORMAL HIGH (ref 1.4–7.0)
Neutrophils: 85 %
Platelets: 510 10*3/uL — ABNORMAL HIGH (ref 150–450)
RBC: 4.8 x10E6/uL (ref 3.77–5.28)
RDW: 14.5 % (ref 11.7–15.4)
WBC: 10.3 10*3/uL (ref 3.4–10.8)

## 2019-03-09 LAB — BASIC METABOLIC PANEL
BUN/Creatinine Ratio: 35 — ABNORMAL HIGH (ref 12–28)
BUN: 27 mg/dL (ref 8–27)
CO2: 29 mmol/L (ref 20–29)
Calcium: 9.2 mg/dL (ref 8.7–10.3)
Chloride: 101 mmol/L (ref 96–106)
Creatinine, Ser: 0.78 mg/dL (ref 0.57–1.00)
GFR calc Af Amer: 83 mL/min/{1.73_m2} (ref >59)
GFR calc non Af Amer: 72 mL/min/{1.73_m2} (ref >59)
Glucose: 414 mg/dL — ABNORMAL HIGH (ref 65–99)
Potassium: 4.2 mmol/L (ref 3.5–5.2)
Sodium: 145 mmol/L — ABNORMAL HIGH (ref 134–144)

## 2019-03-09 LAB — SPECIMEN STATUS REPORT

## 2019-03-12 ENCOUNTER — Telehealth: Payer: Self-pay | Admitting: Internal Medicine

## 2019-03-12 NOTE — Telephone Encounter (Signed)
Please call her - she was seen at the respiratory clinic last week -- how is she feeling?   Her sugar was very high but she is probably aware of that - I know she is working with her endocrinologist on that.

## 2019-03-12 NOTE — Telephone Encounter (Signed)
States she is feeling better today. Still has no energy. Would like to know if you can tell her the results of the recent chest xray. She was never told the results.

## 2019-03-12 NOTE — Telephone Encounter (Signed)
Chest x-ray is stable compared to her previous x-rays-no acute lung disease

## 2019-03-12 NOTE — Telephone Encounter (Signed)
LVM in regards 

## 2019-03-13 NOTE — Telephone Encounter (Signed)
LVM for pt to call back.

## 2019-03-13 NOTE — Telephone Encounter (Signed)
Pt aware of response.  

## 2019-03-20 ENCOUNTER — Ambulatory Visit (INDEPENDENT_AMBULATORY_CARE_PROVIDER_SITE_OTHER): Payer: Medicare Other | Admitting: Cardiovascular Disease

## 2019-03-20 ENCOUNTER — Other Ambulatory Visit: Payer: Self-pay

## 2019-03-20 ENCOUNTER — Encounter: Payer: Self-pay | Admitting: Cardiovascular Disease

## 2019-03-20 VITALS — BP 104/48 | HR 88 | Ht 62.0 in | Wt 196.0 lb

## 2019-03-20 DIAGNOSIS — I25709 Atherosclerosis of coronary artery bypass graft(s), unspecified, with unspecified angina pectoris: Secondary | ICD-10-CM | POA: Diagnosis not present

## 2019-03-20 NOTE — Progress Notes (Signed)
Cardiology Office Note   Date:  03/20/2019   ID:  [name], DOB 23-Sep-1940, MRN [id number]  PCP:  Binnie Rail, MD  Cardiologist:   Mertie Moores, MD   No chief complaint on file.  1. Coronary artery disease-status post CABG, the saphenous vein graft to the right coronary artery is severely diseased but she has good flow to the distal right coronary artery from the native circulation. 2. Diabetes mellitus 3. Hypertension 4. Hyperlipidemia 5. COPD: June, 2013 - FEV1 equals 0.89 L which is 48% of predicted value. Her DLCO is also markedly depressed. 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   [name] is a 79 y.o. female with the above noted hx. She continues to have episodes of sharp pain ( few seconds) followed by a burning pain ( last 6-7 minutes). She usually does not take NTG because it causes a headache. She was started on Lasix earlier this year. She seems to be doing much better on Lasix. She's had only function test which revealed severe obstructive lung disease with a good initial response to bronchodilators.  She is scheduled to have surgery all right knee. She's here today for preoperative evaluation prior to having the surgery.  September 04, 2012:  [name] is seen today for a follow up visit. She saw Richardson Dopp in April and was started on bystolic. She has tolerated that fairly well. She has some episodes of CP that are constant. She is very stable.   03/01/2013:  She continues to have significant CP. Still short of breath. She has no energy. Just does not feel well in general.  She does not get much sleep - perhaps 3 hours a night.   Jan. 8, 2016:  [name] is a 79 yo who we follow for CAD, DM, HTN, hyperlipidemia, . She has COPD: FEV1 equals 0.89 L which is 48% of predicted value. Her DLCO is also markedly depressed. She has severe obstructive lung disease with a beneficial response to  bronchodilators.  Breathing is the same. Has some upper respiratory issues.  Still has some tightness.    Jun 17, 2014:    [name] is a 79 y.o. female who presents for episodes of CP    Started 1 1/2 months ago.  Has tried mylanta - thought it was gas.  Did not get any better Occurs wjith exertion or at rest. , left sided chest pain   No radiation Gets lightheaded. Has tried NTG which seems to help.    Nov. 15 2016:  Has not been feeling well for the past couple of months Has a chest pressure when she gets out of bed.  Occurs twice a day - in the AM when she gets up  And then again at 3:30 in the afternoon.   Is active between those times , makes home visit.   Does water aerobics without any CP / pressure.   Wears home O2 at night.     Jun 23, 2015: Doing the same.  Still has the same chest pain  Last cath was in 2014 - showed patent LIMA to LAD, SVG to D1, the SVG to RCA was occluded   Native RCA has a 40-50% prox stenosis   The pains occur typically twice a day - Upon awakening and around 7 PM.   Nov. 20, 2017:  Has had lots of fatigue / pressure in her chest . Increased shortness of breath walking a short  distance  Has COPD - had issues with bronchitis ,  Was on prednisone  Pain was relieved with NTG and also by taking additional aspirin   April 08, 2016:  She has had some GI bleeding from diverticulitis  Was in the hospital for 4 days  Has been seeing Dr. Fuller Plan.   Aug. 14, 2018: [name] is seen today for follow up    GI bleeding has resolved  Breathing is still an issue.  Feet are swollen by the end of the day  ( no swelling this am )   Jun 24, 2017:  Her BP has been variable  Her dyspnea has been worse recently . Having trouble sleeping at night.   Has been given home O2 .   Never arrived   Feb. 9, 2021  Was sick for 2 weeks .  She tested negative for covid 5 days ago .     She is getting better. Son was recently diagnosed with COVID.   She is  concerned that she may have it despite her negative test 5 days ago    She did not lose her taste or smell    Past Medical History:  Diagnosis Date  . Adenomatous colon polyp   . Allergy   . Anxiety   . Asthma   . Carotid artery disease ([place])    carotid US 02/2017: bilat ICA 1-39%  . Chronic diastolic CHF    Echo 10/5619: EF 65-70, Gr 2 DD, mild MS (mean 5), PASP 44  . COPD    pt is unsure if has been officially diagnosed  . Coronary artery disease    CABG '09- cathed 12/09, 9/10, 6/11, 3/14 and 12/13/16- medical Rx // cath 12/2016 - 2/3 grafts patent >> med Rx // Myoview 12/17: low risk  . Diabetes mellitus   . Gastroesophageal reflux disease   . Hiatal hernia   . 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 Hyperlipidemia   . Hypertension   . PONV (postoperative nausea and vomiting)   . Schatzki's ring   . Shoulder injury    resolved after shoulder surgery  . Sleep apnea    not on cpap    Past Surgical History:  Procedure Laterality Date  . ABDOMINAL HYSTERECTOMY    . APPENDECTOMY     came out with Hysterectomy  . CARDIAC CATHETERIZATION  07/23/2009   EF 60%  . CARDIAC CATHETERIZATION  10/11/2008  . CARDIAC CATHETERIZATION  03/01/2007   EF 75-80%  . CARDIOVASCULAR STRESS TEST  11/15/2007   EF 60%  . COLONOSCOPY    . CORONARY ARTERY BYPASS GRAFT     SEVERELY DISEASED SAPHENOUS VEIN GRAFT TO THE RIGHT CORONARY ARTERY BUT WITH FAIRLY WELL PRESERVED FLOW TO THE DISTAL RIGHT CORONARY ARTERY FROM THE NATIVE CIRCULATION-RESTART  CATH IN JUNE 2000, REVEALS MILD/MODERATE  CAD WITH GOOD FLOW DOWN HER LAD  . ESOPHAGOGASTRODUODENOSCOPY    . EYE SURGERY     bilateral cataract surgery with lens implant  . LEFT HEART CATH AND CORS/GRAFTS ANGIOGRAPHY N/A 12/14/2016   Procedure: LEFT HEART CATH AND CORS/GRAFTS ANGIOGRAPHY;  Surgeon: Jettie Booze, MD;  Location: [facility];  Service: Cardiovascular;  Laterality: N/A;  . lense removal Left   . POLYPECTOMY    . ROTATOR  CUFF REPAIR     right and left  . TONSILLECTOMY     age 34  . TOTAL KNEE ARTHROPLASTY Right 02/20/2014   Procedure: RIGHT TOTAL KNEE  ARTHROPLASTY;  Surgeon: Tobi Bastos, MD;  Location: [facility];  Service: Orthopedics;  Laterality: Right;  . tumor removed kidney    . UPPER GASTROINTESTINAL ENDOSCOPY    . US ECHOCARDIOGRAPHY  03/08/2008   EF 55-60%     Current Outpatient Medications  Medication Sig Dispense Refill  . albuterol (PROVENTIL HFA;VENTOLIN HFA) 108 (90 BASE) MCG/ACT inhaler Inhale 2 puffs into the lungs every 6 (six) hours as needed. For shortness of breath. 1 Inhaler 0  . albuterol (PROVENTIL) (2.5 MG/3ML) 0.083% nebulizer solution USE 1 VIAL IN NEBULIZER EVERY 6 HOURS - and as needed 360 mL 1  . alum & mag hydroxide-simeth (MAALOX/MYLANTA) 200-200-20 MG/5ML suspension Take 30 mLs as needed by mouth for indigestion or heartburn.    Marland Kitchen aspirin EC 81 MG tablet Take 1 tablet (81 mg total) by mouth daily.    . baclofen (LIORESAL) 20 MG tablet TAKE 1 TABLET (20 MG TOTAL) BY MOUTH 3 (THREE) TIMES DAILY. *KEEP 02/11/19 OFFICE VISIT* 270 tablet 1  . benzonatate (TESSALON) 200 MG capsule Take 1 capsule (200 mg total) by mouth 3 (three) times daily as needed for cough. 30 capsule 1  . Blood Glucose Monitoring Suppl (ONE TOUCH ULTRA MINI) w/Device KIT 3 (three) times daily. for testing  0  . calcium carbonate (TUMS - DOSED IN MG ELEMENTAL CALCIUM) 500 MG chewable tablet Chew 2 tablets daily as needed by mouth for indigestion or heartburn.    . Cholecalciferol (VITAMIN D) 2000 UNITS tablet Take 2,000 Units by mouth daily.     . clonazePAM (KLONOPIN) 0.5 MG tablet Take 0.5 mg daily by mouth.    . CREON 24000-76000 units CPEP TAKE 2 CAPSULES BY MOUTH 3 TIMES A DAY WITH MEALS (TAK 1 CAPSULE WITH A SNACK) 210 capsule 1  . Cyanocobalamin (VITAMIN B-12 IJ) Inject 1,000 mcg as directed every 30 (thirty) days.    . diclofenac sodium (VOLTAREN) 1 % GEL Apply 2 g topically 4 (four) times daily as needed  (muscle pain).    Marland Kitchen diltiazem (CARDIZEM CD) 300 MG 24 hr capsule Take 1 capsule (300 mg total) by mouth daily. 90 capsule 1  . doxycycline (VIBRA-TABS) 100 MG tablet Take 1 tablet (100 mg total) by mouth 2 (two) times daily. 20 tablet 0  . ezetimibe (ZETIA) 10 MG tablet Take 10 mg by mouth daily.  6  . famotidine (PEPCID) 40 MG tablet Take 1 tablet (40 mg total) by mouth daily as needed. 90 tablet 1  . fexofenadine (ALLEGRA) 180 MG tablet Take 1 tablet (180 mg total) by mouth daily. 30 tablet 6  . fluconazole (DIFLUCAN) 100 MG tablet Take 1 tablet (100 mg total) by mouth daily. 7 tablet 0  . fluticasone (FLONASE) 50 MCG/ACT nasal spray Place 2 sprays into both nostrils daily. 16 g 6  . Fluticasone-Salmeterol (ADVAIR DISKUS) 250-50 MCG/DOSE AEPB Inhale 1 puff into the lungs 2 (two) times daily. 60 each 5  . furosemide (LASIX) 40 MG tablet Take 1 tablet (40 mg total) by mouth daily. 90 tablet 3  . insulin lispro protamine-insulin lispro (HUMALOG 75/25) (75-25) 100 UNIT/ML SUSP Inject 15-22 Units into the skin See admin instructions. Takes 16 units in the morning, 10 units at lunch, and 25  units with supper    . isosorbide mononitrate (IMDUR) 60 MG 24 hr tablet Take 1.5 tablets (90 mg total) by mouth daily. 135 tablet 1  . loperamide (IMODIUM) 2 MG capsule Take 2 mg by mouth as needed for diarrhea  or loose stools.     . meclizine (ANTIVERT) 25 MG tablet Take 1 tablet (25 mg total) by mouth 2 (two) times daily as needed for dizziness. 30 tablet 2  . metoprolol succinate (TOPROL-XL) 100 MG 24 hr tablet Take 1 tablet (100 mg total) by mouth daily. 30 tablet 11  . montelukast (SINGULAIR) 5 MG chewable tablet CHEW 1 TABLET (5 MG TOTAL) BY MOUTH AT BEDTIME. 90 tablet 1  . Multiple Vitamin (MULTIVITAMIN WITH MINERALS) TABS Take 1 tablet by mouth daily.    . Nebulizers (COMPRESSOR/NEBULIZER) MISC Use with albuterol 1 each 0  . nitroGLYCERIN (NITROSTAT) 0.4 MG SL tablet Place 0.4 mg under the tongue every 5  (five) minutes as needed for chest pain.    . nortriptyline (PAMELOR) 75 MG capsule TAKE 1 CAPSULE (75 MG TOTAL) BY MOUTH AT BEDTIME. 90 capsule 2  . ONE TOUCH ULTRA TEST test strip CHECK BLOOD SUGAR TWICE A DAY DX: E11.9  3  . ONETOUCH DELICA LANCETS 07P MISC 3 (three) times daily. for testing  0  . RABEprazole (ACIPHEX) 20 MG tablet Take 1 tablet (20 mg total) by mouth daily. 30 tablet 5  . Spacer/Aero-Holding Chambers (AEROCHAMBER MV) inhaler Use as instructed 1 each 0  . Tiotropium Bromide Monohydrate (SPIRIVA RESPIMAT) 2.5 MCG/ACT AERS Inhale 2 puffs into the lungs daily. 4 g 6   Current Facility-Administered Medications  Medication Dose Route Frequency Provider Last Rate Last Admin  . cyanocobalamin ((VITAMIN B-12)) injection 1,000 mcg  1,000 mcg Intramuscular Q30 days Binnie Rail, MD   1,000 mcg at 03/26/16 1658    Allergies:   Amlodipine, Banana, Co q10 [coenzyme q10], Codeine, Metformin and related, Morphine, Pentazocine, Pravastatin, Repatha [evolocumab], Statins, Sulfa antibiotics, Sulfonamide derivatives, and Tramadol hcl    Social History:  The patient  reports that she quit smoking about 43 years ago. Her smoking use included cigarettes. She started smoking about 64 years ago. She has a 10.50 pack-year smoking history. She has never used smokeless tobacco. She reports that she does not drink alcohol or use drugs.   Family History:  The patient's family history includes Diabetes in her maternal grandfather and mother; Emphysema in her paternal aunt and paternal uncle; Esophageal cancer (age of onset: 1) in her brother; Healthy in her child; Heart attack in her father; Heart disease in her maternal grandfather; Heart failure in her maternal grandfather; Rheum arthritis in her sister.    ROS:   Noted in current history, all other systems are negative.  Physical Exam: Blood pressure (!) 104/48, pulse 88, height '5\' 2"'  (1.575 m), weight 196 lb (88.9 kg).  GEN:  Elderly female,   NAD  HEENT: Normal NECK: No JVD; No carotid bruits LYMPHATICS: No lymphadenopathy CARDIAC: RRR , no murmurs, rubs, gallops RESPIRATORY:  Clear to auscultation without rales, wheezing or rhonchi  ABDOMEN: Soft, non-tender, non-distended MUSCULOSKELETAL:  No edema; No deformity  SKIN: Warm and dry NEUROLOGIC:  Alert and oriented x 3   EKG:     Recent Labs: 02/12/2019: ALT 9; TSH 0.76 03/07/2019: BUN 27; Creatinine, Ser 0.78; Hemoglobin 11.9; Platelets 510; Potassium 4.2; Sodium 145    Lipid Panel    Component Value Date/Time   CHOL 199 02/12/2019 1439   CHOL 119 12/22/2016 0913   TRIG 161.0 (H) 02/12/2019 1439   HDL 35.60 (L) 02/12/2019 1439   HDL 43 12/22/2016 0913   CHOLHDL 6 02/12/2019 1439   VLDL 32.2 02/12/2019 1439   LDLCALC 131 (H) 02/12/2019 1439  LDLCALC 62 12/22/2016 0913      Wt Readings from Last 3 Encounters:  03/20/19 196 lb (88.9 kg)  03/07/19 194 lb (88 kg)  02/12/19 205 lb (93 kg)      Other studies Reviewed: Additional studies/ records that were reviewed today include: . Review of the above records demonstrates:    ASSESSMENT AND PLAN:  1. Coronary artery disease-status post CABG, the saphenous vein graft to the right coronary artery is severely diseased but she has good flow to the distal right coronary artery from the native circulation. Is not having any cp at present    3. Hypertension -  BP is well controlled.  . 4. Hyperlipidemia - lipids have been well controlled.    5. COPD: June, 2013 - FEV1 equals 0.89 L which is 48% of predicted value. Her DLCO is also markedly depressed. 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  6.  ? COVID:   She had pneumonia severl weeks ago.   Son ( who lives with her ) also had pneumonia and tested + for COVID .    Ive encouraged her to get retested   Current medicines are reviewed at length with the patient today.  The patient does not have concerns regarding  medicines.  The following changes have been made:  no change  Labs/ tests ordered today include:  No orders of the defined types were placed in this encounter.    Disposition:   FU with  APP in 6 months .    Mertie Moores, MD  03/20/2019 10:54 AM    [facility] [address], [place], [place]  [address] Phone: [phone]; Fax: [phone]

## 2019-03-20 NOTE — Patient Instructions (Addendum)
Medication Instructions:  Your physician recommends that you continue on your current medications as directed. Please refer to the Current Medication list given to you today.  *If you need a refill on your cardiac medications before your next appointment, please call your pharmacy*  Lab Work: None Ordered If you have labs (blood work) drawn today and your tests are completely normal, you will receive your results only by: Marland Kitchen MyChart Message (if you have MyChart) OR . A paper copy in the mail If you have any lab test that is abnormal or we need to change your treatment, we will call you to review the results.   Testing/Procedures: None Ordered   Follow-Up: At [facility], you and your health needs are our priority.  As part of our continuing mission to provide you with exceptional heart care, we have created designated Provider Care Teams.  These Care Teams include your primary Cardiologist (physician) and Advanced Practice Providers (APPs -  Physician Assistants and Nurse Practitioners) who all work together to provide you with the care you need, when you need it.  Your next appointment:   6 month(s)  The format for your next appointment:   Either In Person or Virtual  Provider:   You may see one of the following Advanced Practice Providers on your designated Care Team:    Richardson Dopp, PA-C  [place], [place]  Daune Perch, NP   COVID-19 Vaccine Information can be found at: [email or web address] For questions related to vaccine distribution or appointments, please email [email or web address] or call [phone].   Call [phone] about getting a Covid test today

## 2019-03-21 ENCOUNTER — Ambulatory Visit: Payer: Medicare Other | Attending: Internal Medicine

## 2019-03-21 ENCOUNTER — Encounter: Payer: Self-pay | Admitting: Internal Medicine

## 2019-03-21 DIAGNOSIS — Z20822 Contact with and (suspected) exposure to covid-19: Secondary | ICD-10-CM

## 2019-03-21 NOTE — Telephone Encounter (Signed)
Close  

## 2019-03-22 LAB — NOVEL CORONAVIRUS, NAA: SARS-CoV-2, NAA: NOT DETECTED

## 2019-04-05 ENCOUNTER — Ambulatory Visit: Payer: Medicare Other | Attending: Internal Medicine

## 2019-04-05 DIAGNOSIS — Z23 Encounter for immunization: Secondary | ICD-10-CM | POA: Insufficient documentation

## 2019-04-05 NOTE — Progress Notes (Signed)
   Covid-19 Vaccination Clinic  Name:  [name]    MRN: [id number] DOB: [birth year]  04/05/2019  [name] was observed post Covid-19 immunization for 15 minutes without incidence. She was provided with Vaccine Information Sheet and instruction to access the V-Safe system.   [name] was instructed to call 911 with any severe reactions post vaccine: Marland Kitchen Difficulty breathing  . Swelling of your face and throat  . A fast heartbeat  . A bad rash all over your body  . Dizziness and weakness    Immunizations Administered    Name Date Dose VIS Date Route   Pfizer COVID-19 Vaccine 04/05/2019  3:18 PM 0.3 mL 01/19/2019 Intramuscular   Manufacturer: [place]   Lot: [id number]   [place]: [id number]

## 2019-05-01 ENCOUNTER — Ambulatory Visit: Payer: Medicare Other | Attending: Internal Medicine

## 2019-05-01 DIAGNOSIS — Z23 Encounter for immunization: Secondary | ICD-10-CM

## 2019-05-01 NOTE — Progress Notes (Signed)
   Covid-19 Vaccination Clinic  Name:  [name]    MRN: [id number] DOB: 01/12/1941  05/01/2019  [name] was observed post Covid-19 immunization for 15 minutes without incident. She was provided with Vaccine Information Sheet and instruction to access the V-Safe system.   [name] was instructed to call 911 with any severe reactions post vaccine: Marland Kitchen Difficulty breathing  . Swelling of face and throat  . A fast heartbeat  . A bad rash all over body  . Dizziness and weakness   Immunizations Administered    Name Date Dose VIS Date Route   Pfizer COVID-19 Vaccine 05/01/2019  1:00 PM 0.3 mL 01/19/2019 Intramuscular   Manufacturer: [place]   Lot: [id number]   [place]: [id number]

## 2019-05-23 ENCOUNTER — Other Ambulatory Visit: Payer: Self-pay | Admitting: Internal Medicine

## 2019-06-05 ENCOUNTER — Encounter: Payer: Medicare Other | Attending: Endocrinology | Admitting: Nutrition

## 2019-06-05 ENCOUNTER — Ambulatory Visit (INDEPENDENT_AMBULATORY_CARE_PROVIDER_SITE_OTHER): Payer: Medicare Other

## 2019-06-05 ENCOUNTER — Other Ambulatory Visit: Payer: Self-pay

## 2019-06-05 ENCOUNTER — Encounter: Payer: Self-pay | Admitting: Pulmonary Disease

## 2019-06-05 ENCOUNTER — Ambulatory Visit (INDEPENDENT_AMBULATORY_CARE_PROVIDER_SITE_OTHER): Payer: Medicare Other | Admitting: Pulmonary Disease

## 2019-06-05 VITALS — BP 128/82 | HR 71 | Temp 98.5°F | Ht 62.0 in | Wt 194.6 lb

## 2019-06-05 DIAGNOSIS — E1065 Type 1 diabetes mellitus with hyperglycemia: Secondary | ICD-10-CM | POA: Insufficient documentation

## 2019-06-05 DIAGNOSIS — G4733 Obstructive sleep apnea (adult) (pediatric): Secondary | ICD-10-CM | POA: Diagnosis not present

## 2019-06-05 DIAGNOSIS — J449 Chronic obstructive pulmonary disease, unspecified: Secondary | ICD-10-CM | POA: Diagnosis not present

## 2019-06-05 DIAGNOSIS — J4489 Other specified chronic obstructive pulmonary disease: Secondary | ICD-10-CM

## 2019-06-05 MED ORDER — PREDNISONE 10 MG PO TABS: ORAL_TABLET | ORAL | 0 refills | Status: DC

## 2019-06-05 MED ORDER — TRELEGY ELLIPTA 200-62.5-25 MCG/INH IN AEPB: 1.0000 | INHALATION_SPRAY | Freq: Every day | RESPIRATORY_TRACT | 0 refills | Status: DC

## 2019-06-05 NOTE — Patient Instructions (Signed)
We will start you on CPAP AutoSet 5-15 without supplemental oxygen for sleep apnea We will give prednisone 40 mg a day for 5 days Stop advair, spiriva and start Trelegy 200 We will get a chest x-ray today  Follow-up in 3 months.

## 2019-06-05 NOTE — Progress Notes (Signed)
[name]    [id number]    1940/09/17  Primary Care Physician:Burns, Claudina Lick, MD  Referring Physician: Binnie Rail, MD [place],  [place] [address]  Chief complaint: Follow-up for COPD, asthma, OSA  HPI: [name] has history of COPD, asthma overlap syndrome, GERD, OSA.  Previously followed by Dr. Ashok Cordia and Dr. Shelbie Hutching on Advair, Spiriva.  Has daily symptoms of dyspnea.  Using albuterol every day No nighttime awakenings.  Reports symptom of GERD.  Declined referral to GI Has history of sleep apnea. CPAP machine was discontinued as patient was noncompliant although she claims that she had been using it regularly.  Follow-up sleep studies in 2018 was nondiagnostic as she slept 20 4/2 now. Home sleep study in 2019 did not demonstrate OSA but there is nocturnal hypoxemia.  Patient was prescribed O2 at night but is not using it regularly More recently seen by neurology for headaches which is thought to be secondary to sleep apnea.  She has started using the oxygen at night for the past week.  Pets: No pets Occupation: Retired Education officer, museum from [employer] Exposures: No known exposures.  No mold, hot tub, Jacuzzi, no down pillows or comforters. Smoking history: 10-pack-year smoker.  Quit around 1990 Travel history: No significant travel history Relevant family history: No significant family history of lung disease  Act score 11/03/2018-9  Interim history: Complains of increasing cough, congestion for the past few months.  She received a course of prednisone in January with some improvement Continues on Advair, Spiriva.  She is being evaluated by orthopedics for leg weakness and is scheduled for MRI soon.  Outpatient Encounter Medications as of 06/05/2019  Medication Sig  . albuterol (PROVENTIL HFA;VENTOLIN HFA) 108 (90 BASE) MCG/ACT inhaler Inhale 2 puffs into the lungs every 6 (six) hours as needed. For shortness of breath.  Marland Kitchen  albuterol (PROVENTIL) (2.5 MG/3ML) 0.083% nebulizer solution USE 1 VIAL IN NEBULIZER EVERY 6 HOURS - and as needed  . alum & mag hydroxide-simeth (MAALOX/MYLANTA) 200-200-20 MG/5ML suspension Take 30 mLs as needed by mouth for indigestion or heartburn.  Marland Kitchen aspirin EC 81 MG tablet Take 1 tablet (81 mg total) by mouth daily.  . baclofen (LIORESAL) 20 MG tablet TAKE 1 TABLET (20 MG TOTAL) BY MOUTH 3 (THREE) TIMES DAILY. *KEEP 02/11/19 OFFICE VISIT*  . benzonatate (TESSALON) 200 MG capsule Take 1 capsule (200 mg total) by mouth 3 (three) times daily as needed for cough.  . Blood Glucose Monitoring Suppl (ONE TOUCH ULTRA MINI) w/Device KIT 3 (three) times daily. for testing  . calcium carbonate (TUMS - DOSED IN MG ELEMENTAL CALCIUM) 500 MG chewable tablet Chew 2 tablets daily as needed by mouth for indigestion or heartburn.  . Cholecalciferol (VITAMIN D) 2000 UNITS tablet Take 2,000 Units by mouth daily.   . clonazePAM (KLONOPIN) 0.5 MG tablet Take 0.5 mg daily by mouth.  . CREON 24000-76000 units CPEP TAKE 2 CAPSULES BY MOUTH 3 TIMES A DAY WITH MEALS (TAK 1 CAPSULE WITH A SNACK)  . Cyanocobalamin (VITAMIN B-12 IJ) Inject 1,000 mcg as directed every 30 (thirty) days.  . diclofenac sodium (VOLTAREN) 1 % GEL Apply 2 g topically 4 (four) times daily as needed (muscle pain).  Marland Kitchen diltiazem (CARDIZEM CD) 300 MG 24 hr capsule TAKE 1 CAPSULE BY MOUTH EVERY DAY  . doxycycline (VIBRA-TABS) 100 MG tablet Take 1 tablet (100 mg total) by mouth 2 (two) times daily.  Marland Kitchen  ezetimibe (ZETIA) 10 MG tablet Take 10 mg by mouth daily.  . famotidine (PEPCID) 40 MG tablet Take 1 tablet (40 mg total) by mouth daily as needed.  . fexofenadine (ALLEGRA) 180 MG tablet Take 1 tablet (180 mg total) by mouth daily.  . fluticasone (FLONASE) 50 MCG/ACT nasal spray Place 2 sprays into both nostrils daily.  . Fluticasone-Salmeterol (ADVAIR DISKUS) 250-50 MCG/DOSE AEPB Inhale 1 puff into the lungs 2 (two) times daily.  . furosemide (LASIX) 40  MG tablet Take 1 tablet (40 mg total) by mouth daily.  . insulin lispro protamine-insulin lispro (HUMALOG 75/25) (75-25) 100 UNIT/ML SUSP Inject 15-22 Units into the skin See admin instructions. Takes 16 units in the morning, 10 units at lunch, and 25  units with supper  . isosorbide mononitrate (IMDUR) 60 MG 24 hr tablet Take 1.5 tablets (90 mg total) by mouth daily.  Marland Kitchen loperamide (IMODIUM) 2 MG capsule Take 2 mg by mouth as needed for diarrhea or loose stools.   . meclizine (ANTIVERT) 25 MG tablet Take 1 tablet (25 mg total) by mouth 2 (two) times daily as needed for dizziness.  . metoprolol succinate (TOPROL-XL) 100 MG 24 hr tablet Take 1 tablet (100 mg total) by mouth daily.  . montelukast (SINGULAIR) 5 MG chewable tablet CHEW 1 TABLET (5 MG TOTAL) BY MOUTH AT BEDTIME.  . Multiple Vitamin (MULTIVITAMIN WITH MINERALS) TABS Take 1 tablet by mouth daily.  . Nebulizers (COMPRESSOR/NEBULIZER) MISC Use with albuterol  . nitroGLYCERIN (NITROSTAT) 0.4 MG SL tablet Place 0.4 mg under the tongue every 5 (five) minutes as needed for chest pain.  . nortriptyline (PAMELOR) 75 MG capsule TAKE 1 CAPSULE (75 MG TOTAL) BY MOUTH AT BEDTIME.  . ONE TOUCH ULTRA TEST test strip CHECK BLOOD SUGAR TWICE A DAY DX: E11.9  . ONETOUCH DELICA LANCETS 16X MISC 3 (three) times daily. for testing  . RABEprazole (ACIPHEX) 20 MG tablet Take 1 tablet (20 mg total) by mouth daily.  Marland Kitchen Spacer/Aero-Holding Chambers (AEROCHAMBER MV) inhaler Use as instructed  . Tiotropium Bromide Monohydrate (SPIRIVA RESPIMAT) 2.5 MCG/ACT AERS Inhale 2 puffs into the lungs daily.  . [DISCONTINUED] fluconazole (DIFLUCAN) 100 MG tablet Take 1 tablet (100 mg total) by mouth daily.   Facility-Administered Encounter Medications as of 06/05/2019  Medication  . cyanocobalamin ((VITAMIN B-12)) injection 1,000 mcg   Physical Exam: Blood pressure 118/68, pulse 72, temperature (!) 97 F (36.1 C), temperature source Temporal, height _0  (1.575 m),  weight 205 lb 3.2 oz (93.1 kg), SpO2 99 %. Gen:      No acute distress HEENT:  EOMI, sclera anicteric Neck:     No masses; no thyromegaly Lungs:    Clear to auscultation bilaterally; normal respiratory effort CV:         Regular rate and rhythm; no murmurs Abd:      + bowel sounds; soft, non-tender; no palpable masses, no distension Ext:    No edema; adequate peripheral perfusion Skin:      Warm and dry; no rash Neuro: alert and oriented x 3 Psych: normal mood and affect  Data Reviewed: Imaging: CT chest 12/20/2017- right lower lobe nodular opacity has resolved compatible with atelectasis.  Apical pulmonary nodule decreased in size.  Emphysema. 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  No acute pulmonary disease. Chest x-ray 03/08/2019- Mild interstitial markings with atelectasis. I have reviewed the images personally.  PFTs: 06/04/15: FVC 1.57 L (77%) FEV1 1.01 L (64%) FEV1/FVC 0.65 FEF 25-75 0.54 L (39%) negative bronchodilator  response 02/21/15: FVC 1.60 L (78%) FEV1 1.03 L (65%) FEV1/FVC 0.64 FEF 25-75 0.51 L (37%) positive bronchodilator response 11/20/14: FVC 1.44 L (70%) FEV1 0.91 L (57%) FEV1/FVC 0.63 FEF 25-75 0.47 L (33%) positive bronchodilator response TLC 3.92 L (79%) RV 103% DLCO uncorrected 64% 11/12/13: FVC 1.57 L (74%) FEV1 1.09 L (67%) FEV1/FVC 0.70 FEF 25-75 0.72 L (50%) negative bronchodilator response 08/04/11: FVC 1.61 L (60%) FEV1 0.89 L (48%) FEV1/FVC 0.56 FEF 25-75 0.29 L (14%) positive bronchodilator response TLC 3.67 L (80%) RV 103% ERV 46% DLCO corrected 38%  Labs: CBC 10/20/2017-WBC 6, eos 2.6%, absolute eosinophil count 156 CBC 02/12/2019-WBC 7.1, eos 1.8%, absolute eosinophil count 128  Sleep Home sleep study 03/29/2017- AHI 1.8, desats to 78% PSG 10/07/2016- AHI 6.8.  Total sleep time 40.5 minutes.  In lab sleep study 11/20/2018-mild sleep apnea AHI 13.3, low O2 sat of 81%.  Assessment:  Severe persistent asthma Asthma exacerbation with wheezing We will get  a chest x-ray today Short course of prednisone 40 mg a day for 5 days.  She does not want higher doses moved from therapy due to concern of side effects Stop Advair, Spiriva and switch to Trelegy inhaler. Chest x-ray today. Continue Singulair, Allegra, Flonase  Nocturnal hypoxia Being evaluated for headaches which is thought to be secondary to sleep apnea Home sleep study ordered but previous one in 2019 did not demonstrate significant AHI However in lab sleep study does show mild apnea with low O2 sats. She is currently on nocturnal oxygen Switch this to AutoSet CPAP  She has been noncompliant with nocturnal oxygen.  Encouraged to use it daily.  Plan/Recommendations: - Start Trelegy instead of Advair, Spiriva - Prednisone for 5 days - Chest x-ray - AutoSet CPAP.  Marshell Garfinkel MD [facility] 06/05/2019, 11:23 AM  CC: Binnie Rail, MD

## 2019-06-05 NOTE — Addendum Note (Signed)
Addended by: Elton Sin on: 06/05/2019 02:35 PM   Modules accepted: Orders

## 2019-06-06 NOTE — Progress Notes (Signed)
This patient is here today because "my blood sugars have been going very high, and my doctor would like me on an insulin pump.  Her doctor is recommending the [employer] Control IQ pump. We discussed pump therapy--advantages and disadvantages and what is involved with using this. She was shown the Dexcom CGM and how this works to send blood sugar readings to the pump, to help calculate her meal time insulin dose.  She does not count carbs, but we discussed how the meal time insulin is directly related to how many carbs are eaten.  We discussed what carbs are, and what is a serving size for the carbs she is eating.  I believe she will be able to use a simple calculation of 1u for each carb serving to begin with, and we can go from there. She says the she does not eat many carbs, and bfast is eggs, bacon and 1/2 piece of toast with fruit.   Lunch is generally about the same amount of carbs, but supper varies with snacking during the day, and a "sweet tooth of cookies and other things" that happen usually at night after supper, or late in the afternoon.  We discussed the need for balanced meals to prevent hunger and the fact that starches are an essential part of this meal plan, and need to be considered.  We reviewed the list of all starchs and she agreed that she can use this to simplify her meal by making sure she has 2 servings at each meal (30 carbs). I believe she can use this pump with some modifications of serving sizes in stead of carb counting.   She filled out paperwork for this pump and it was faxed to [employer].  She was told that she will hear back from [employer] with the amount, if any that she will have to pay, before deciding if she wants this.  She will call me when she gets the pump for training.   She had no final questions

## 2019-06-12 NOTE — Patient Instructions (Signed)
Review list of carb sizes and make sure to eat at least 2 servings for every meal.

## 2019-06-19 ENCOUNTER — Telehealth: Payer: Self-pay | Admitting: Nutrition

## 2019-06-19 NOTE — Telephone Encounter (Signed)
Patient reports that she has heard nothing from [employer] or [employer] about her pump/CGM order.   [employer] rep. Was called and message left on machine to call patient to let them know of status of pump paperwork

## 2019-07-03 ENCOUNTER — Other Ambulatory Visit: Payer: Self-pay | Admitting: Internal Medicine

## 2019-07-18 ENCOUNTER — Encounter: Payer: Medicare Other | Admitting: *Deleted

## 2019-07-18 ENCOUNTER — Other Ambulatory Visit: Payer: Self-pay

## 2019-07-18 DIAGNOSIS — Z006 Encounter for examination for normal comparison and control in clinical research program: Secondary | ICD-10-CM

## 2019-07-18 NOTE — Research (Signed)
Subject to research clinic for visit 4 in the [place] research study.  No cos, aes or saes to report.  Injection given, subject tolerated well. Next clinic visit scheduled

## 2019-07-25 ENCOUNTER — Telehealth: Payer: Self-pay | Admitting: Nutrition

## 2019-07-25 ENCOUNTER — Telehealth: Payer: Self-pay | Admitting: Pulmonary Disease

## 2019-07-25 NOTE — Telephone Encounter (Signed)
Called pt but unable to reach and unable to leave VM due to mailbox being full. Will try to call back later.

## 2019-07-25 NOTE — Telephone Encounter (Signed)
For matters such as insulin pump prior authorizations or CGM prior authorizations, please forward these needs to the appropriate nurse or medical assistant that works with the provider that treats the patient.

## 2019-07-25 NOTE — Telephone Encounter (Signed)
Message left on machine from patient to have office call her [facility] for approval of her insulin pump.  Forwarding message to Wyatt Haste to do this.

## 2019-07-26 NOTE — Telephone Encounter (Signed)
Attempted to call pt x2 but unable to reach and still unable to leave VM due to mailbox being full.

## 2019-07-26 NOTE — Telephone Encounter (Signed)
This pt has not been seen by Dr. Loanne Drilling since 04/22/15 and is no longer our patient. She would need to re-establish as a pt before we can proceed with anything pertaining to a pump start. Will need to be referred by PCP per protocol.

## 2019-07-27 NOTE — Telephone Encounter (Signed)
ATC pt, there was no answer and I could not leave a message due to VM being full. We have attempted to contact pt several times with no success or call back from pt. Per triage protocol, message will be closed.

## 2019-08-02 ENCOUNTER — Other Ambulatory Visit: Payer: Self-pay | Admitting: Internal Medicine

## 2019-08-17 ENCOUNTER — Other Ambulatory Visit: Payer: Self-pay | Admitting: Internal Medicine

## 2019-08-20 ENCOUNTER — Other Ambulatory Visit: Payer: Self-pay | Admitting: Internal Medicine

## 2019-09-05 ENCOUNTER — Ambulatory Visit: Payer: Medicare Other | Admitting: Pulmonary Disease

## 2019-09-06 ENCOUNTER — Other Ambulatory Visit: Payer: Self-pay | Admitting: Pulmonary Disease

## 2019-09-06 DIAGNOSIS — J449 Chronic obstructive pulmonary disease, unspecified: Secondary | ICD-10-CM

## 2019-09-11 ENCOUNTER — Telehealth: Payer: Self-pay | Admitting: Nutrition

## 2019-09-11 ENCOUNTER — Other Ambulatory Visit: Payer: Self-pay | Admitting: Internal Medicine

## 2019-09-11 NOTE — Telephone Encounter (Signed)
[employer] representative called saying they have tried several times to reach the patient, leaving messages without return that insurance denied pump, but that they can do an appeal with her consent.  They have heard nothing back from her

## 2019-09-21 ENCOUNTER — Other Ambulatory Visit (HOSPITAL_COMMUNITY): Payer: Self-pay | Admitting: Chiropractic Medicine

## 2019-09-21 ENCOUNTER — Other Ambulatory Visit: Payer: Self-pay | Admitting: Chiropractic Medicine

## 2019-09-21 DIAGNOSIS — S329XXG Fracture of unspecified parts of lumbosacral spine and pelvis, subsequent encounter for fracture with delayed healing: Secondary | ICD-10-CM

## 2019-09-24 ENCOUNTER — Other Ambulatory Visit: Payer: Self-pay | Admitting: Neurology

## 2019-09-25 ENCOUNTER — Ambulatory Visit (HOSPITAL_COMMUNITY)
Admission: RE | Admit: 2019-09-25 | Discharge: 2019-09-25 | Disposition: A | Discharge status: Home / Self Care | Payer: Medicare Other | Source: Ambulatory Visit | Attending: Chiropractic Medicine | Admitting: Chiropractic Medicine

## 2019-09-25 ENCOUNTER — Other Ambulatory Visit: Payer: Self-pay

## 2019-09-25 ENCOUNTER — Encounter (HOSPITAL_COMMUNITY)
Admission: RE | Admit: 2019-09-25 | Discharge: 2019-09-25 | Disposition: A | Discharge status: Home / Self Care | Payer: Medicare Other | Source: Ambulatory Visit | Attending: Chiropractic Medicine | Admitting: Chiropractic Medicine

## 2019-09-25 DIAGNOSIS — S329XXG Fracture of unspecified parts of lumbosacral spine and pelvis, subsequent encounter for fracture with delayed healing: Secondary | ICD-10-CM | POA: Diagnosis present

## 2019-09-25 MED ORDER — TECHNETIUM TC 99M MEDRONATE IV KIT
20.0000 | PACK | Freq: Once | INTRAVENOUS | Status: AC | PRN
  Administered 2019-09-25: 20 via INTRAVENOUS

## 2019-09-26 ENCOUNTER — Ambulatory Visit: Payer: Medicare Other | Admitting: Physician Assistant

## 2019-09-26 NOTE — Progress Notes (Deleted)
Cardiology Office Note:    Date:  09/26/2019   ID:  MARIELLEN BLANEY, DOB 08/10/1940, MRN [id number]  PCP:  Binnie Rail, MD  Cardiologist:  Mertie Moores, MD *** Electrophysiologist:  None   Referring MD: Binnie Rail, MD   Chief Complaint:  No chief complaint on file.    Patient Profile:    [name] is a 79 y.o. female with:   Coronary artery disease  ? S/p CABG ? 2/3 grafts patent by cath 12/2016 ? Myoview 01/2016: Low Risk  RBBB   Chronic Diastolic CHF ? Echocardiogram 02/2017: EF 65-70, Gr 2 DD  Diabetes mellitus   Hyperlipidemia   Hypertension   COPD  Prior CV studies: *** Carotid US 02/18/17 Bilat ICA 1-39  Echocardiogram 02/17/2017 EF 65-70, no RWMA, Gr 2 DD, severe MAC, mild MS (mean 5), mild LAE, trivial PI, PASP 44  Event Monitor 01/2017 Normal sinus rhythm, no arrhythmias  Cardiac catheterization 12/14/16 LAD prox 80; D1 ost 100 RCA prox 25, mid 25 S-PDA 100 L-LAD patent  S-D1 patent  EF 55-65    Nuclear stress test 01/12/16 EF 62, normal perfusion, low risk  Echocardiogram 09/18/15 EF 60-65, normal wall motion, abnormal relaxation, moderate to severe MAC, mild mitral stenosis (mean gradient 4), PASP 42  Carotid US 09/08/15 Bilateral ICA 1-39  Echocardiogram 01/07/15 EF 60-65, normal wall motion, grade 1 diastolic dysfunction, moderate to severe MAC, mild LAE, PASP 46, mildly reduced RV SF  Nuclear stress test 06/25/14 Normal perfusion, EF 55-65, low risk  Nuclear stress test 03/22/13 EF 68, no significant scar or ischemia, low risk  LHC 04/20/12: Proximal LAD 50%, mid LAD 50-60% circumflex normal proximal RCA 40-50% SVG-RCA occluded LIMA-LAD patent SVG-D1 patent.  Medical therapy was recommended  Echo 09/2011:  EF 60-55%, normal wall motion, grade 1 diastolic dysfunction, severe MAC.  Nuclear study 01/2011:  EF 72%, small fixed inferobasal defect suggestive of thinning, no ischemia.    History of  Present Illness:    [name] was last seen by Dr. Acie Fredrickson in 03/2019.  She returns for follow up.  The DICTATELATER SmartLink is not supported in this context. 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   Current Facility-Administered Medications for the 09/26/19 encounter (Appointment) with Richardson Dopp T, PA-C  Medication   cyanocobalamin ((VITAMIN B-12)) injection 1,000 mcg  Allergies:   Amlodipine, Banana, Co q10 [coenzyme q10], Codeine, Metformin and related, Morphine, Pentazocine, Pravastatin, Repatha [evolocumab], Statins, Sulfa antibiotics, Sulfonamide derivatives, and Tramadol hcl   Social History   Tobacco Use   Smoking status: Former Smoker    Packs/day: 0.50    Years: 21.00    Pack years: 10.50    Types: Cigarettes    Start date: 02/09/1955    Quit date: 02/09/1976    Years since quitting: 43.6   Smokeless tobacco: Never Used  Vaping Use   Vaping Use: Never used  Substance Use Topics   Alcohol use: No    Alcohol/week: 0.0 standard drinks   Drug use: No      Family Hx: The patient's family history includes Diabetes in her maternal grandfather and mother; Emphysema in her paternal aunt and paternal uncle; Esophageal cancer (age of onset: 87) in her brother; Healthy in her child; Heart attack in her father; Heart disease in her maternal grandfather; Heart failure in her maternal grandfather; Rheum arthritis in her sister. There is no history of Neuropathy, Multiple sclerosis, Colon cancer, Colon polyps, Rectal cancer, or Stomach cancer.  ROS   EKGs/Labs/Other Test Reviewed:    EKG:  EKG is *** ordered today.  The ekg ordered today demonstrates ***  Recent Labs: 02/12/2019: ALT 9; TSH 0.76 03/07/2019: BUN 27; Creatinine, Ser 0.78; Hemoglobin 11.9; Platelets 510; Potassium 4.2; Sodium 145   Recent Lipid Panel Lab Results  Component Value Date/Time   CHOL 199 02/12/2019 02:39 PM   CHOL 119 12/22/2016 09:13 AM   TRIG 161.0 (H) 02/12/2019 02:39 PM   HDL 35.60 (L) 02/12/2019 02:39 PM   HDL 43 12/22/2016 09:13 AM   CHOLHDL 6 02/12/2019 02:39 PM   LDLCALC 131 (H) 02/12/2019 02:39 PM   LDLCALC 62 12/22/2016 09:13 AM    Physical Exam:    VS:  LMP  (LMP Unknown)     Wt Readings from Last 3 Encounters:  06/05/19 194 lb 9.6 oz (88.3 kg)  03/20/19 196 lb (88.9 kg)  03/07/19 194 lb (88 kg)     Physical Exam ***  ASSESSMENT & PLAN:    *** 1. Essential hypertension Blood pressure is currently running above target.  She typically sees systolic readings in the 546T.  Even though she did not add any extra salt to her food last week, question if her pressures are elevated in response to recent holiday meals.  Her PCP increased her diltiazem last week.  She has not picked up that prescription yet.  I suspect she will need another agent to improve her blood pressure control.  She does not appear to have any allergy to ACE inhibitors and is currently not on an ACE inhibitor or an ARB.  Given her history of COPD, I would recommend using an ARB instead of  an ACE inhibitor.             -Monitor blood pressure over the next couple of weeks and send readings for review             -If blood pressure above target (>130/80), add losartan 50 mg daily             -Follow-up with Dr. Acie Fredrickson or me in 8 weeks  2. Chronic diastolic CHF (congestive heart failure) (HCC) Echocardiogram 02/2017 demonstrated EF 65-70% with moderate diastolic dysfunction.  Overall, volume status seems to be stable.  Continue current dose of furosemide.  3. Coronary artery disease involving coronary bypass graft of  native heart with angina pectoris ([place]) History of CABG.  Cardiac catheterization in November 2018 demonstrated 2/3 grafts patent.  She is managed medically.  Continue aspirin, ezetimibe.  4. 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  As noted, if her blood pressure remains above target, add ARB to her regimen.   Dispo:  No follow-ups on file.   Medication Adjustments/Labs and Tests Ordered: Current medicines are reviewed at length with the patient today.  Concerns regarding medicines are outlined above.  Tests Ordered: No orders of the defined types were placed in this encounter.  Medication Changes: No orders of the defined types were placed in this encounter.   Signed, Richardson Dopp, PA-C  09/26/2019 12:53 PM    [facility] [address], [place], [place]  [address] Phone: [phone]; Fax: [phone]

## 2019-10-03 ENCOUNTER — Other Ambulatory Visit: Payer: Self-pay | Admitting: Cardiovascular Disease

## 2019-10-03 ENCOUNTER — Other Ambulatory Visit: Payer: Self-pay | Admitting: Physician Assistant

## 2019-10-04 ENCOUNTER — Encounter: Payer: Self-pay | Admitting: *Deleted

## 2019-10-04 DIAGNOSIS — Z006 Encounter for examination for normal comparison and control in clinical research program: Secondary | ICD-10-CM

## 2019-10-17 ENCOUNTER — Other Ambulatory Visit: Payer: Self-pay | Admitting: Neurology

## 2019-10-26 ENCOUNTER — Other Ambulatory Visit: Payer: Self-pay | Admitting: Internal Medicine

## 2019-10-29 ENCOUNTER — Telehealth: Payer: Self-pay | Admitting: Pulmonary Disease

## 2019-10-29 MED ORDER — AZITHROMYCIN 250 MG PO TABS: ORAL_TABLET | ORAL | 0 refills | Status: DC

## 2019-10-29 MED ORDER — PREDNISONE 20 MG PO TABS: ORAL_TABLET | ORAL | 0 refills | Status: DC

## 2019-10-29 NOTE — Progress Notes (Signed)
Subjective:    Patient ID: [name], female    DOB: 11-02-1940, 79 y.o.   MRN: [id number]  HPI The patient is here for follow up of their chronic medical problems, including CAD, htn, DM, hyperlipidemia, GERD, B12 def, insomnia  She is taking all of her medications as prescribed.   She is not exercising regularly.     She cracked her pelvis.  She did not fall.  She had an xray and MRI.  She had a bone scan.  The orthopedics advised a pelvic exam because all of her pain is in her pelvis-they are not sure if it is related to her bones or not..    It is painful to walk.  She can not stand too long due to pain.  Her pelvis has slipped and has not healed well-the bones are not currently aligned.  They feel her pain should be gone and so they want to look into other reasons for the pain.  She has had a hysterectomy, but still has her ovaries.  She denies any issues with urination or bowel movement.  She is taking pain medication.   She saw pulm yesterday and was started on an abx and prednisone.     Medications and allergies reviewed with patient and updated if appropriate.  Patient Active Problem List   Diagnosis Date Noted  . Chest pain at rest 08/24/2018  . Arthralgia 08/01/2017  . COPD with asthma ([place]) 05/04/2017  . Angina pectoris ([place]) 01/05/2017  . Insomnia 11/23/2016  . Chronic nonintractable headache 11/23/2016  . Pancreatic insufficiency 10/28/2016  . Carotid artery disease ([place]) 05/19/2016  . Fatigue 05/19/2016  . Anemia 03/27/2016  . Acute bronchitis with COPD ([place]) 12/05/2015  . Type 1 diabetes mellitus ([place]) 07/21/2015  . B12 deficiency 03/31/2015  . OSA (obstructive sleep apnea) with hypoxia 11/20/2014  . Fecal incontinence 08/28/2014  . 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 Falls 08/28/2014  . Neck pain of over 3 months duration 08/28/2014  . H/O total knee replacement 02/20/2014  . Obesity (BMI 30-39.9) 11/12/2013  . Chronic diastolic CHF  (congestive heart failure) ([place]) 11/17/2012  . Meniscus, lateral, anterior horn derangement 11/10/2011  . Medial meniscus, posterior horn derangement 11/10/2011  . Osteoarthritis of right knee 11/10/2011  . Vertigo 10/05/2011  . Labile hypertension 10/05/2011  . CAD (coronary artery disease) 08/27/2010  . MUSCLE CRAMPS 12/29/2009  . History of myocardial infarction 11/19/2009  . Extrinsic asthma 11/19/2009  . GERD 11/19/2009  . Cough 11/19/2009  . Hyperlipidemia 11/18/2009    Current Outpatient Medications on File Prior to Visit  Medication Sig Dispense Refill  . albuterol (PROVENTIL HFA;VENTOLIN HFA) 108 (90 BASE) MCG/ACT inhaler Inhale 2 puffs into the lungs every 6 (six) hours as needed. For shortness of breath. 1 Inhaler 0  . albuterol (PROVENTIL) (2.5 MG/3ML) 0.083% nebulizer solution USE 1 VIAL IN NEBULIZER EVERY 6 HOURS - and as needed 360 mL 1  . alum & mag hydroxide-simeth (MAALOX/MYLANTA) 200-200-20 MG/5ML suspension Take 30 mLs as needed by mouth for indigestion or heartburn.    Marland Kitchen aspirin EC 81 MG tablet Take 1 tablet (81 mg total) by mouth daily.    Marland Kitchen azithromycin (ZITHROMAX) 250 MG tablet Take 2 tablet's the first day and once daily until finished. 6 tablet 0  . baclofen (LIORESAL) 20 MG tablet Take 1 tablet (20 mg total) by mouth 3 (three) times daily. Due for f/u appt. 270 tablet 0  . benzonatate (TESSALON) 200 MG  capsule Take 1 capsule (200 mg total) by mouth 3 (three) times daily as needed for cough. 30 capsule 1  . Blood Glucose Monitoring Suppl (ONE TOUCH ULTRA MINI) w/Device KIT 3 (three) times daily. for testing  0  . calcium carbonate (TUMS - DOSED IN MG ELEMENTAL CALCIUM) 500 MG chewable tablet Chew 2 tablets daily as needed by mouth for indigestion or heartburn.    . Cholecalciferol (VITAMIN D) 2000 UNITS tablet Take 2,000 Units by mouth daily.     . clonazePAM (KLONOPIN) 0.5 MG tablet Take 0.5 mg daily by mouth.    . CREON 24000-76000 units CPEP TAKE 2 CAPSULES BY  MOUTH 3 TIMES A DAY WITH MEALS (TAK 1 CAPSULE WITH A SNACK) 210 capsule 1  . Cyanocobalamin (VITAMIN B-12 IJ) Inject 1,000 mcg as directed every 30 (thirty) days.    . diclofenac sodium (VOLTAREN) 1 % GEL Apply 2 g topically 4 (four) times daily as needed (muscle pain).    Marland Kitchen diltiazem (CARDIZEM CD) 300 MG 24 hr capsule Take 1 capsule (300 mg total) by mouth daily. Must keep scheduled appt for future refills 30 capsule 0  . ezetimibe (ZETIA) 10 MG tablet Take 10 mg by mouth daily.  6  . famotidine (PEPCID) 40 MG tablet Take 1 tablet (40 mg total) by mouth daily as needed. 90 tablet 1  . fexofenadine (ALLEGRA) 180 MG tablet TAKE 1 TABLET BY MOUTH EVERY DAY 90 tablet 3  . fluticasone (FLONASE) 50 MCG/ACT nasal spray Place 2 sprays into both nostrils daily. 16 g 6  . Fluticasone-Salmeterol (ADVAIR DISKUS) 250-50 MCG/DOSE AEPB Inhale 1 puff into the lungs 2 (two) times daily. 60 each 5  . Fluticasone-Umeclidin-Vilant (TRELEGY ELLIPTA) 200-62.5-25 MCG/INH AEPB Inhale 1 puff into the lungs daily. 14 each 0  . furosemide (LASIX) 40 MG tablet Take 1 tablet (40 mg total) by mouth daily. 90 tablet 3  . insulin lispro protamine-insulin lispro (HUMALOG 75/25) (75-25) 100 UNIT/ML SUSP Inject 15-22 Units into the skin See admin instructions. Takes 16 units in the morning, 10 units at lunch, and 25  units with supper    . isosorbide mononitrate (IMDUR) 60 MG 24 hr tablet TAKE 1.5 TABLETS (90 MG TOTAL) BY MOUTH DAILY. 135 tablet 1  . loperamide (IMODIUM) 2 MG capsule Take 2 mg by mouth as needed for diarrhea or loose stools.     . meclizine (ANTIVERT) 25 MG tablet Take 1 tablet (25 mg total) by mouth 2 (two) times daily as needed for dizziness. 30 tablet 2  . metoprolol succinate (TOPROL-XL) 100 MG 24 hr tablet TAKE 1 TABLET BY MOUTH EVERY DAY 90 tablet 1  . montelukast (SINGULAIR) 5 MG chewable tablet CHEW 1 TABLET (5 MG TOTAL) BY MOUTH AT BEDTIME. 90 tablet 1  . Multiple Vitamin (MULTIVITAMIN WITH MINERALS) TABS  Take 1 tablet by mouth daily.    . Nebulizers (COMPRESSOR/NEBULIZER) MISC Use with albuterol 1 each 0  . nitroGLYCERIN (NITROSTAT) 0.4 MG SL tablet Place 0.4 mg under the tongue every 5 (five) minutes as needed for chest pain.    . nortriptyline (PAMELOR) 75 MG capsule TAKE 1 CAPSULE (75 MG TOTAL) BY MOUTH AT BEDTIME. 60 capsule 0  . ONE TOUCH ULTRA TEST test strip CHECK BLOOD SUGAR TWICE A DAY DX: E11.9  3  . ONETOUCH DELICA LANCETS 96P MISC 3 (three) times daily. for testing  0  . predniSONE (DELTASONE) 10 MG tablet 32m x's 5 days 20 tablet 0  . predniSONE (DELTASONE) 20  MG tablet 2 tablet's daily for 4 days. 8 tablet 0  . RABEprazole (ACIPHEX) 20 MG tablet TAKE 1 TABLET (20 MG TOTAL) BY MOUTH DAILY. FOLLOW-UP APPT IS DUE 90 tablet 1  . Spacer/Aero-Holding Chambers (AEROCHAMBER MV) inhaler Use as instructed 1 each 0  . Study - ORION 4 - inclisiran 300 mg/1.21m or placebo SQ injection (PI-[place]uckey) Inject 300 mg into the skin every 6 (six) months.    . Tiotropium Bromide Monohydrate (SPIRIVA RESPIMAT) 2.5 MCG/ACT AERS Inhale 2 puffs into the lungs daily. 4 g 6   Current Facility-Administered Medications on File Prior to Visit  Medication Dose Route Frequency Provider Last Rate Last Admin  . cyanocobalamin ((VITAMIN B-12)) injection 1,000 mcg  1,000 mcg Intramuscular Q30 days BBinnie Rail MD   1,000 mcg at 03/26/16 1658    Past Medical History:  Diagnosis Date  . Adenomatous colon polyp   . Allergy   . Anxiety   . Asthma   . Carotid artery disease ([place]    carotid [place]/2019: bilat ICA 1-39%  . Chronic diastolic CHF    Echo 15/4982 EF 65-70, Gr 2 DD, mild MS (mean 5), PASP 44  . COPD    pt is unsure if has been officially diagnosed  . Coronary artery disease    CABG '09- cathed 12/09, 9/10, 6/11, 3/14 and 12/13/16- medical Rx // cath 12/2016 - 2/3 grafts patent >> med Rx // Myoview 12/17: low risk  . Diabetes mellitus   . Gastroesophageal reflux disease   . Hiatal hernia   .  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 Hyperlipidemia   . Hypertension   . PONV (postoperative nausea and vomiting)   . Schatzki's ring   . Shoulder injury    resolved after shoulder surgery  . Sleep apnea    not on cpap    Past Surgical History:  Procedure Laterality Date  . ABDOMINAL HYSTERECTOMY    . APPENDECTOMY     came out with Hysterectomy  . CARDIAC CATHETERIZATION  07/23/2009   EF 60%  . CARDIAC CATHETERIZATION  10/11/2008  . CARDIAC CATHETERIZATION  03/01/2007   EF 75-80%  . CARDIOVASCULAR STRESS TEST  11/15/2007   EF 60%  . COLONOSCOPY    . CORONARY ARTERY BYPASS GRAFT     SEVERELY DISEASED SAPHENOUS VEIN GRAFT TO THE RIGHT CORONARY ARTERY BUT WITH FAIRLY WELL PRESERVED FLOW TO THE DISTAL RIGHT CORONARY ARTERY FROM THE NATIVE CIRCULATION-RESTART  CATH IN JUNE 2000, REVEALS MILD/MODERATE  CAD WITH GOOD FLOW DOWN HER LAD  . ESOPHAGOGASTRODUODENOSCOPY    . EYE SURGERY     bilateral cataract surgery with lens implant  . LEFT HEART CATH AND CORS/GRAFTS ANGIOGRAPHY N/A 12/14/2016   Procedure: LEFT HEART CATH AND CORS/GRAFTS ANGIOGRAPHY;  Surgeon: VJettie Booze MD;  [facility];  Service: Cardiovascular;  Laterality: N/A;  . lense removal Left   . POLYPECTOMY    . ROTATOR CUFF REPAIR     right and left  . TONSILLECTOMY     age 79 . TOTAL KNEE ARTHROPLASTY Right 02/20/2014   Procedure: RIGHT TOTAL KNEE ARTHROPLASTY;  Surgeon: RTobi Bastos MD;  Location: [facility];  Service: Orthopedics;  Laterality: Right;  . tumor removed kidney    . UPPER GASTROINTESTINAL ENDOSCOPY    . [place]  03/08/2008   EF 55-60%    Social History   Socioeconomic History  . Marital status: Widowed  Spouse name: Not on file  . Number of children: 4  . Years of education: Designer, jewellery  . Highest education level: Doctorate  Occupational History  . Occupation: Retired  Tobacco Use  . Smoking status: Former Smoker    Packs/day: 0.50    Years: 21.00     Pack years: 10.50    Types: Cigarettes    Start date: 02/09/1955    Quit date: 02/09/1976    Years since quitting: 43.7  . Smokeless tobacco: Never Used  Vaping Use  . Vaping Use: Never used  Substance and Sexual Activity  . Alcohol use: No    Alcohol/week: 0.0 standard drinks  . Drug use: No  . Sexual activity: Never  Other Topics Concern  . Not on file  Social History Narrative   Lives alone.  One story home.  Has 4 children.  Education: doctorate in theology.    Caffeine use: Drinks 1 cup coffee/day      Originally from [place]. Previously has lived in [place]. Prior travel to [place], [place], [place], [place], [place], [place], [place], & [place]. Previously worked in [employer] She has a dog currently. No bird, mold, or hot tub exposure. She also pastors a church.    Social Determinants of Health   Financial Resource Strain:   . Difficulty of Paying Living Expenses: Not on file  Food Insecurity:   . Worried About Charity fundraiser in the Last Year: Not on file  . Ran Out of Food in the Last Year: Not on file  Transportation Needs:   . Lack of Transportation (Medical): Not on file  . Lack of Transportation (Non-Medical): Not on file  Physical Activity:   . Days of Exercise per Week: Not on file  . Minutes of Exercise per Session: Not on file  Stress:   . Feeling of Stress : Not on file  Social Connections:   . Frequency of Communication with Friends and Family: Not on file  . Frequency of Social Gatherings with Friends and Family: Not on file  . Attends Religious Services: Not on file  . Active Member of Clubs or Organizations: Not on file  . Attends Archivist Meetings: Not on file  . Marital Status: Not on file    Family History  Problem Relation Age of Onset  . Heart disease Maternal Grandfather   . Heart failure Maternal Grandfather   . Diabetes Maternal Grandfather   . Heart attack Father   . Diabetes Mother   . Rheum arthritis Sister   . Emphysema  Paternal Uncle   . Esophageal cancer Brother 72       she said he was born with it  . Emphysema Paternal Aunt   . Healthy Child   . Neuropathy Neg Hx   . Multiple sclerosis Neg Hx   . Colon cancer Neg Hx   . Colon polyps Neg Hx   . Rectal cancer Neg Hx   . Stomach cancer Neg Hx     Review of Systems  Constitutional: Positive for fatigue. Negative for chills and fever.  Respiratory: Positive for cough, shortness of breath and wheezing.   Cardiovascular: Positive for palpitations (with exertion) and leg swelling (RLE only). Negative for chest pain.  Genitourinary: Positive for pelvic pain. Negative for difficulty urinating, dysuria and frequency.       No incontinence  Neurological: Positive for light-headedness and headaches.       Objective:   Vitals:   10/30/19 1455  BP: 140/60  Temp: 98.7 F (37.1 C)   BP Readings from Last 3 Encounters:  10/30/19 140/60  06/05/19 128/82  03/20/19 (!) 104/48   Wt Readings from Last 3 Encounters:  06/05/19 194 lb 9.6 oz (88.3 kg)  03/20/19 196 lb (88.9 kg)  03/07/19 194 lb (88 kg)   Body mass index is 35.59 kg/m.   Physical Exam    Constitutional: Appears well-developed and well-nourished. No distress.  HENT:  Head: Normocephalic and atraumatic.  Neck: Neck supple. No tracheal deviation present. No thyromegaly present.  No cervical lymphadenopathy Cardiovascular: Normal rate, regular rhythm and normal heart sounds.   No murmur heard. No carotid bruit .  No edema Pulmonary/Chest: Effort normal . No respiratory distress.  Faint expiratory wheeze.  No rales.  Skin: Skin is warm and dry. Not diaphoretic.  Psychiatric: Normal mood and affect. Behavior is normal.      Assessment & Plan:    See Problem List for Assessment and Plan of chronic medical problems.    This visit occurred during the SARS-CoV-2 public health emergency.  Safety protocols were in place, including screening questions prior to the visit, additional  usage of staff PPE, and extensive cleaning of exam room while observing appropriate contact time as indicated for disinfecting solutions.

## 2019-10-29 NOTE — Telephone Encounter (Signed)
Call in Z pack and offer prednisone 40 mg/day for 4 days

## 2019-10-29 NOTE — Telephone Encounter (Signed)
Spoke with patient regarding prior message. Advised patient that Per Dr.Mannam ok to send in script for Zpack and offered patient prednisone 40mg  sent into patient's phara,cy of choice. Patient's voice was understanding. Nothing else further needed.

## 2019-10-29 NOTE — Patient Instructions (Addendum)
  Blood work was ordered.    You had a B12 injection today.    Medications reviewed and updated.  Changes include :   Restart aciphex for your heartburn.  Increase baclofen 40 mg twice daily.    Your prescription(s) have been submitted to your pharmacy. Please take as directed and contact our office if you believe you are having problem(s) with the medication(s).  A referral was ordered for  uro-gynecologist.      Someone from their office will call you to schedule an appointment.    Please followup in 6 months

## 2019-10-29 NOTE — Telephone Encounter (Signed)
Spoke with the pt  She is c/o increased SOB, wheezing,  cough and chest congestion over the past 4 days  She is coughing up yellow sputum  She also c/o body aches  She has not had chills or sweats  She is still taking her advair and spiriva and using her albuterol inhaler about 2 x per day  She is asking for something to be called in  Please advise thanks!

## 2019-10-30 ENCOUNTER — Ambulatory Visit (INDEPENDENT_AMBULATORY_CARE_PROVIDER_SITE_OTHER): Payer: Medicare Other | Admitting: Internal Medicine

## 2019-10-30 ENCOUNTER — Other Ambulatory Visit: Payer: Self-pay

## 2019-10-30 ENCOUNTER — Encounter: Payer: Self-pay | Admitting: Internal Medicine

## 2019-10-30 VITALS — BP 140/60 | Temp 98.7°F | Ht 62.0 in

## 2019-10-30 DIAGNOSIS — R252 Cramp and spasm: Secondary | ICD-10-CM

## 2019-10-30 DIAGNOSIS — E1059 Type 1 diabetes mellitus with other circulatory complications: Secondary | ICD-10-CM | POA: Diagnosis not present

## 2019-10-30 DIAGNOSIS — K219 Gastro-esophageal reflux disease without esophagitis: Secondary | ICD-10-CM | POA: Diagnosis not present

## 2019-10-30 DIAGNOSIS — R0989 Other specified symptoms and signs involving the circulatory and respiratory systems: Secondary | ICD-10-CM | POA: Diagnosis not present

## 2019-10-30 DIAGNOSIS — R102 Pelvic and perineal pain: Secondary | ICD-10-CM

## 2019-10-30 DIAGNOSIS — E538 Deficiency of other specified B group vitamins: Secondary | ICD-10-CM

## 2019-10-30 DIAGNOSIS — G4709 Other insomnia: Secondary | ICD-10-CM

## 2019-10-30 DIAGNOSIS — E785 Hyperlipidemia, unspecified: Secondary | ICD-10-CM

## 2019-10-30 DIAGNOSIS — D649 Anemia, unspecified: Secondary | ICD-10-CM

## 2019-10-30 MED ORDER — RABEPRAZOLE SODIUM 20 MG PO TBEC
DELAYED_RELEASE_TABLET | ORAL | 1 refills | Status: DC
Start: 2019-10-30 — End: 2020-06-02

## 2019-10-30 MED ORDER — BACLOFEN 20 MG PO TABS
40.0000 mg | ORAL_TABLET | Freq: Two times a day (BID) | ORAL | 2 refills | Status: DC
Start: 2019-10-30 — End: 2020-11-10

## 2019-10-30 MED ORDER — BENZONATATE 200 MG PO CAPS: 200.0000 mg | ORAL_CAPSULE | Freq: Three times a day (TID) | ORAL | 1 refills | Status: DC | PRN

## 2019-10-30 NOTE — Assessment & Plan Note (Signed)
Chronic Should be getting B12 injections monthly-is not up-to-date Injection today

## 2019-10-30 NOTE — Assessment & Plan Note (Signed)
Chronic Blood pressure here today fairly controlled Continue current medications CMP

## 2019-10-30 NOTE — Assessment & Plan Note (Signed)
Chronic Has been following with orthopedics Does have pelvic fractures and her bones have not aligned or healed correctly-Ortho wonders if this is truly the source of her pain She does have chronic pain and it is significant The only other source of her pain could be pelvic floor dysfunction, which seems unlikely given her lack of urinary or bowel symptoms Will refer to urogynecology for further evaluation of this

## 2019-10-30 NOTE — Assessment & Plan Note (Signed)
Chronic CBC, iron, ferritin

## 2019-10-30 NOTE — Assessment & Plan Note (Signed)
Taking nortriptyline

## 2019-10-30 NOTE — Assessment & Plan Note (Signed)
Chronic Severe not controlled Will increase baclofen to 40 mg twice daily

## 2019-10-30 NOTE — Assessment & Plan Note (Signed)
Chronic Ran out of aciphex, but this was controlling her gerd Restart aciphex daily

## 2019-10-30 NOTE — Assessment & Plan Note (Signed)
Chronic Statin intolerant On Zetia

## 2019-10-30 NOTE — Assessment & Plan Note (Signed)
Chronic Following with endocrine Management per Dr. Chalmers Cater

## 2019-10-31 LAB — MICROALBUMIN / CREATININE URINE RATIO
Creatinine, Urine: 208 mg/dL (ref 20–275)
Microalb Creat Ratio: 10 mcg/mg creat (ref <30)
Microalb, Ur: 2 mg/dL

## 2019-10-31 LAB — CBC WITH DIFFERENTIAL/PLATELET
Absolute Monocytes: 600 cells/uL (ref 200–950)
Basophils Absolute: 38 cells/uL (ref 0–200)
Basophils Relative: 0.5 %
Eosinophils Absolute: 8 cells/uL — ABNORMAL LOW (ref 15–500)
Eosinophils Relative: 0.1 %
HCT: 37.1 % (ref 35.0–45.0)
Hemoglobin: 11.3 g/dL — ABNORMAL LOW (ref 11.7–15.5)
Lymphs Abs: 1718 cells/uL (ref 850–3900)
MCH: 25.3 pg — ABNORMAL LOW (ref 27.0–33.0)
MCHC: 30.5 g/dL — ABNORMAL LOW (ref 32.0–36.0)
MCV: 83.2 fL (ref 80.0–100.0)
MPV: 9.1 fL (ref 7.5–12.5)
Monocytes Relative: 8 %
Neutro Abs: 5138 cells/uL (ref 1500–7800)
Neutrophils Relative %: 68.5 %
Platelets: 290 10*3/uL (ref 140–400)
RBC: 4.46 10*6/uL (ref 3.80–5.10)
RDW: 15 % (ref 11.0–15.0)
Total Lymphocyte: 22.9 %
WBC: 7.5 10*3/uL (ref 3.8–10.8)

## 2019-10-31 LAB — COMPLETE METABOLIC PANEL WITH GFR
AG Ratio: 1.2 (calc) (ref 1.0–2.5)
ALT: 9 U/L (ref 6–29)
AST: 14 U/L (ref 10–35)
Albumin: 4.1 g/dL (ref 3.6–5.1)
Alkaline phosphatase (APISO): 123 U/L (ref 37–153)
BUN: 19 mg/dL (ref 7–25)
CO2: 32 mmol/L (ref 20–32)
Calcium: 9.6 mg/dL (ref 8.6–10.4)
Chloride: 103 mmol/L (ref 98–110)
Creat: 0.78 mg/dL (ref 0.60–0.88)
GFR, Est African American: 83 mL/min/{1.73_m2} (ref >=60)
GFR, Est Non African American: 71 mL/min/{1.73_m2} (ref >=60)
Globulin: 3.4 g/dL (calc) (ref 1.9–3.7)
Glucose, Bld: 131 mg/dL — ABNORMAL HIGH (ref 65–99)
Potassium: 4.1 mmol/L (ref 3.5–5.3)
Sodium: 144 mmol/L (ref 135–146)
Total Bilirubin: 0.2 mg/dL (ref 0.2–1.2)
Total Protein: 7.5 g/dL (ref 6.1–8.1)

## 2019-10-31 LAB — IRON: Iron: 37 ug/dL — ABNORMAL LOW (ref 45–160)

## 2019-10-31 LAB — FERRITIN: Ferritin: 39 ng/mL (ref 16–288)

## 2019-11-12 ENCOUNTER — Telehealth: Payer: Self-pay | Admitting: Pulmonary Disease

## 2019-11-12 NOTE — Telephone Encounter (Signed)
Spoke with the pt   We called in pred and zpack 9/20  She states breathing got better for a few days while on pred but now worse again  She is wheezing and has cough that is now non prod  She states feels like mucus needs to come up but it won't  No body aches, f/c/s and she has had to covid vaccine  Still taking her advair, spiriva and albuterol inhaler  Please advise, thanks!  Allergies  Allergen Reactions  . Amlodipine Other (See Comments)    hallucinations   . Banana Nausea And Vomiting    Stomach pumped  . Co Q10 [Coenzyme Q10]     Body cramps  . Codeine     Hallucinate, loose identity and don't know who I am  . Metformin And Related Nausea And Vomiting  . Morphine Other (See Comments)    Can not function, it immobilizes me   . Pentazocine Nausea And Vomiting  . Pravastatin     Hands locked up  . Repatha [Evolocumab]     myalgias  . Statins     Muscle cramps  . Sulfa Antibiotics Swelling  . Sulfonamide Derivatives Swelling  . Tramadol Hcl

## 2019-11-13 NOTE — Telephone Encounter (Signed)
Please call in another prednisone course 40 mg a day for 5 days Advised to take over-the-counter Mucinex and Delsym for cough congestion

## 2019-11-13 NOTE — Telephone Encounter (Signed)
ATC pt, no answer and vm full. Will call back. Prednisone has been pended in pt's chart- will send after speaking to pt.

## 2019-11-15 NOTE — Telephone Encounter (Signed)
Called and spoke to pt. Informed her of the recs per Dr. Macie Burows verbalized understanding but is questioning if there is another treatment besides prednisone as she has DM type I and states her BS can go to the 300s while on pred.   Dr. Vaughan Browner please advise. Thanks.

## 2019-11-16 NOTE — Telephone Encounter (Signed)
ATC patient-unable to leave vm due to mailbox being full.  Will call back.  

## 2019-11-16 NOTE — Telephone Encounter (Signed)
No good alternatives to prednsione.  We can see if just the z pack helps Otherwise she will need to check with her primary care about tighter diabetes control while on steroids

## 2019-11-19 NOTE — Telephone Encounter (Signed)
ATC pt, her VM is still full. Will try back. 

## 2019-11-20 NOTE — Telephone Encounter (Signed)
Call and spoke to pt. Pt states since she finished the zpak late last month (September) she is requesting for another round. Pt does not want the prednisone. Pt states her cough has improved some but is still having some wheezing, non prod cough, and increase in SOB from baseline. Pt denies f/c/s, swelling, CP/tightness. Pt is ok waiting until 10/13 to get back to her. Pt sounded better over the phone than she did on 10/7 when I spoke with her.   Dr. Vaughan Browner, please advise. Thanks.

## 2019-11-21 MED ORDER — AZITHROMYCIN 250 MG PO TABS: ORAL_TABLET | ORAL | 0 refills | Status: DC

## 2019-11-21 NOTE — Telephone Encounter (Signed)
ATC, VM full.  Azithromycin sent to pharmacy on file.

## 2019-11-21 NOTE — Telephone Encounter (Signed)
ATC, NA and VM still full- need to let her know that the rx was sent for zpack

## 2019-11-21 NOTE — Telephone Encounter (Signed)
Ok to give another round of z pack

## 2019-11-22 NOTE — Telephone Encounter (Signed)
ATC pt, VM is still full. We have attempted to contact pt several times with no success or call back. Per triage protocol, message will be closed.

## 2019-11-25 ENCOUNTER — Other Ambulatory Visit: Payer: Self-pay | Admitting: Internal Medicine

## 2019-12-13 ENCOUNTER — Other Ambulatory Visit: Payer: Self-pay | Admitting: Neurology

## 2019-12-18 NOTE — Patient Instructions (Addendum)
  Blood work was ordered.    b12 Injection given.     Medications reviewed and updated.  Changes include :   Increase nortriptyline 100 mg nightly  Your prescription(s) have been submitted to your pharmacy. Please take as directed and contact our office if you believe you are having problem(s) with the medication(s).    Please followup in 6 months

## 2019-12-18 NOTE — Progress Notes (Signed)
Subjective:    Patient ID: [name], female    DOB: January 19, 1941, 79 y.o.   MRN: [id number]  HPI The patient is here for follow up of their chronic medical problems, including htn, GERD, muscle cramps, DM type 1 ( Dr Chalmers Cater), B12 def, anemia, insomnia, pelvic pain from fractures  She is taking all of her medications as prescribed.    She had a bad bout with vertigo and could not drive for 2-3 days.  She took meclizine and it did help.  She will get it if she moves quickly.  She did PT in the past and it helped.    Her pelvis is still not healing - next month she will see a specialist regarding this.    Medications and allergies reviewed with patient and updated if appropriate.  Patient Active Problem List   Diagnosis Date Noted  . Pelvic pain 10/30/2019  . Chest pain at rest 08/24/2018  . Arthralgia 08/01/2017  . COPD with asthma ([place]) 05/04/2017  . Angina pectoris ([place]) 01/05/2017  . Insomnia 11/23/2016  . Chronic nonintractable headache 11/23/2016  . Pancreatic insufficiency 10/28/2016  . Carotid artery disease ([place]) 05/19/2016  . Fatigue 05/19/2016  . Anemia 03/27/2016  . Type 1 diabetes mellitus ([place]) 07/21/2015  . B12 deficiency 03/31/2015  . OSA (obstructive sleep apnea) with hypoxia 11/20/2014  . Fecal incontinence 08/28/2014  . 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 Falls 08/28/2014  . Neck pain of over 3 months duration 08/28/2014  . H/O total knee replacement 02/20/2014  . Obesity (BMI 30-39.9) 11/12/2013  . Chronic diastolic CHF (congestive heart failure) ([place]) 11/17/2012  . Meniscus, lateral, anterior horn derangement 11/10/2011  . Medial meniscus, posterior horn derangement 11/10/2011  . Osteoarthritis of right knee 11/10/2011  . Vertigo 10/05/2011  . Labile hypertension 10/05/2011  . CAD (coronary artery disease) 08/27/2010  . MUSCLE CRAMPS 12/29/2009  . History of myocardial infarction 11/19/2009  . Extrinsic asthma 11/19/2009  . GERD 11/19/2009   . Cough 11/19/2009  . Hyperlipidemia 11/18/2009    Current Outpatient Medications on File Prior to Visit  Medication Sig Dispense Refill  . albuterol (PROVENTIL HFA;VENTOLIN HFA) 108 (90 BASE) MCG/ACT inhaler Inhale 2 puffs into the lungs every 6 (six) hours as needed. For shortness of breath. 1 Inhaler 0  . albuterol (PROVENTIL) (2.5 MG/3ML) 0.083% nebulizer solution USE 1 VIAL IN NEBULIZER EVERY 6 HOURS - and as needed 360 mL 1  . alum & mag hydroxide-simeth (MAALOX/MYLANTA) 200-200-20 MG/5ML suspension Take 30 mLs as needed by mouth for indigestion or heartburn.    Marland Kitchen aspirin EC 81 MG tablet Take 1 tablet (81 mg total) by mouth daily.    . baclofen (LIORESAL) 20 MG tablet Take 2 tablets (40 mg total) by mouth 2 (two) times daily. Due for f/u appt. 360 tablet 2  . benzonatate (TESSALON) 200 MG capsule Take 1 capsule (200 mg total) by mouth 3 (three) times daily as needed for cough. 30 capsule 1  . Blood Glucose Monitoring Suppl (ONE TOUCH ULTRA MINI) w/Device KIT 3 (three) times daily. for testing  0  . calcium carbonate (TUMS - DOSED IN MG ELEMENTAL CALCIUM) 500 MG chewable tablet Chew 2 tablets daily as needed by mouth for indigestion or heartburn.    . Cholecalciferol (VITAMIN D) 2000 UNITS tablet Take 2,000 Units by mouth daily.     . clonazePAM (KLONOPIN) 0.5 MG tablet Take 0.5 mg daily by mouth.    . clopidogrel (PLAVIX) 75 MG  tablet Take 75 mg by mouth daily.    Marland Kitchen CREON 24000-76000 units CPEP TAKE 2 CAPSULES BY MOUTH 3 TIMES A DAY WITH MEALS (TAK 1 CAPSULE WITH A SNACK) 210 capsule 1  . Cyanocobalamin (VITAMIN B-12 IJ) Inject 1,000 mcg as directed every 30 (thirty) days.    . diclofenac sodium (VOLTAREN) 1 % GEL Apply 2 g topically 4 (four) times daily as needed (muscle pain).    Marland Kitchen diltiazem (CARDIZEM CD) 300 MG 24 hr capsule TAKE 1 CAPSULE (300 MG TOTAL) BY MOUTH DAILY. MUST KEEP SCHEDULED APPT FOR FUTURE REFILLS 90 capsule 1  . diltiazem (TIAZAC) 300 MG 24 hr capsule diltiazem CD 300  mg capsule,extended release 24 hr  TAKE 1 CAPSULE (300 MG TOTAL) BY MOUTH DAILY. MUST KEEP SCHEDULED APPT FOR FUTURE REFILLS    . ezetimibe (ZETIA) 10 MG tablet Take 10 mg by mouth daily.  6  . famotidine (PEPCID) 40 MG tablet Take 1 tablet (40 mg total) by mouth daily as needed. 90 tablet 1  . fexofenadine (ALLEGRA) 180 MG tablet TAKE 1 TABLET BY MOUTH EVERY DAY 90 tablet 3  . fluticasone (FLONASE) 50 MCG/ACT nasal spray Place 2 sprays into both nostrils daily. 16 g 6  . Fluticasone-Salmeterol (ADVAIR DISKUS) 250-50 MCG/DOSE AEPB Inhale 1 puff into the lungs 2 (two) times daily. 60 each 5  . Fluticasone-Umeclidin-Vilant (TRELEGY ELLIPTA) 200-62.5-25 MCG/INH AEPB Inhale 1 puff into the lungs daily. 14 each 0  . furosemide (LASIX) 40 MG tablet Take 1 tablet (40 mg total) by mouth daily. 90 tablet 3  . insulin lispro protamine-insulin lispro (HUMALOG 75/25) (75-25) 100 UNIT/ML SUSP Inject 15-22 Units into the skin See admin instructions. Takes 16 units in the morning, 10 units at lunch, and 25  units with supper    . isosorbide mononitrate (IMDUR) 60 MG 24 hr tablet TAKE 1.5 TABLETS (90 MG TOTAL) BY MOUTH DAILY. 135 tablet 1  . loperamide (IMODIUM) 2 MG capsule Take 2 mg by mouth as needed for diarrhea or loose stools.     . meclizine (ANTIVERT) 25 MG tablet Take 1 tablet (25 mg total) by mouth 2 (two) times daily as needed for dizziness. 30 tablet 2  . metoprolol succinate (TOPROL-XL) 100 MG 24 hr tablet TAKE 1 TABLET BY MOUTH EVERY DAY 90 tablet 1  . montelukast (SINGULAIR) 5 MG chewable tablet CHEW 1 TABLET (5 MG TOTAL) BY MOUTH AT BEDTIME. 90 tablet 1  . Multiple Vitamin (MULTIVITAMIN WITH MINERALS) TABS Take 1 tablet by mouth daily.    . Nebulizers (COMPRESSOR/NEBULIZER) MISC Use with albuterol 1 each 0  . nitroGLYCERIN (NITROSTAT) 0.4 MG SL tablet Place 0.4 mg under the tongue every 5 (five) minutes as needed for chest pain.    . NUCYNTA 50 MG tablet Take by mouth.    . ONE TOUCH ULTRA TEST  test strip CHECK BLOOD SUGAR TWICE A DAY DX: E11.9  3  . ONETOUCH DELICA LANCETS 26S MISC 3 (three) times daily. for testing  0  . RABEprazole (ACIPHEX) 20 MG tablet TAKE 1 TABLET (20 MG TOTAL) BY MOUTH DAILY. 90 tablet 1  . Spacer/Aero-Holding Chambers (AEROCHAMBER MV) inhaler Use as instructed 1 each 0  . Study - ORION 4 - inclisiran 300 mg/1.50m or placebo SQ injection (PI-[place]uc[place]) Inject 300 mg into the skin every 6 (six) months.    . Tiotropium Bromide Monohydrate (SPIRIVA RESPIMAT) 2.5 MCG/ACT AERS Inhale 2 puffs into the lungs daily. 4 g 6   Current Facility-Administered Medications  on File Prior to Visit  Medication Dose Route Frequency Provider Last Rate Last Admin  . cyanocobalamin ((VITAMIN B-12)) injection 1,000 mcg  1,000 mcg Intramuscular Q30 days Binnie Rail, MD   1,000 mcg at 03/26/16 1658    Past Medical History:  Diagnosis Date  . Adenomatous colon polyp   . Allergy   . Anxiety   . Asthma   . Carotid artery disease ([place])    carotid US 02/2017: bilat ICA 1-39%  . Chronic diastolic CHF    Echo 08/348: EF 65-70, Gr 2 DD, mild MS (mean 5), PASP 44  . COPD    pt is unsure if has been officially diagnosed  . Coronary artery disease    CABG '09- cathed 12/09, 9/10, 6/11, 3/14 and 12/13/16- medical Rx // cath 12/2016 - 2/3 grafts patent >> med Rx // Myoview 12/17: low risk  . Diabetes mellitus   . Gastroesophageal reflux disease   . Hiatal hernia   . 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 Hyperlipidemia   . Hypertension   . PONV (postoperative nausea and vomiting)   . Schatzki's ring   . Shoulder injury    resolved after shoulder surgery  . Sleep apnea    not on cpap    Past Surgical History:  Procedure Laterality Date  . ABDOMINAL HYSTERECTOMY    . APPENDECTOMY     came out with Hysterectomy  . CARDIAC CATHETERIZATION  07/23/2009   EF 60%  . CARDIAC CATHETERIZATION  10/11/2008  . CARDIAC CATHETERIZATION  03/01/2007   EF 75-80%  . CARDIOVASCULAR  STRESS TEST  11/15/2007   EF 60%  . COLONOSCOPY    . CORONARY ARTERY BYPASS GRAFT     SEVERELY DISEASED SAPHENOUS VEIN GRAFT TO THE RIGHT CORONARY ARTERY BUT WITH FAIRLY WELL PRESERVED FLOW TO THE DISTAL RIGHT CORONARY ARTERY FROM THE NATIVE CIRCULATION-RESTART  CATH IN JUNE 2000, REVEALS MILD/MODERATE  CAD WITH GOOD FLOW DOWN HER LAD  . ESOPHAGOGASTRODUODENOSCOPY    . EYE SURGERY     bilateral cataract surgery with lens implant  . LEFT HEART CATH AND CORS/GRAFTS ANGIOGRAPHY N/A 12/14/2016   Procedure: LEFT HEART CATH AND CORS/GRAFTS ANGIOGRAPHY;  Surgeon: Jettie Booze, MD;  Location: [facility];  Service: Cardiovascular;  Laterality: N/A;  . lense removal Left   . POLYPECTOMY    . ROTATOR CUFF REPAIR     right and left  . TONSILLECTOMY     age 43  . 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 tumor removed kidney    . UPPER GASTROINTESTINAL ENDOSCOPY    . US ECHOCARDIOGRAPHY  03/08/2008   EF 55-60%    Social History   Socioeconomic History  . Marital status: Widowed    Spouse name: Not on file  . Number of children: 4  . Years of education: Designer, jewellery  . Highest education level: Doctorate  Occupational History  . Occupation: Retired  Tobacco Use  . Smoking status: Former Smoker    Packs/day: 0.50    Years: 21.00    Pack years: 10.50    Types: Cigarettes    Start date: 02/09/1955    Quit date: 02/09/1976    Years since quitting: 43.8  . Smokeless tobacco: Never Used  Vaping Use  . Vaping Use: Never used  Substance and Sexual Activity  .  Alcohol use: No    Alcohol/week: 0.0 standard drinks  . Drug use: No  . Sexual activity: Never  Other Topics Concern  . Not on file  Social History Narrative   Lives alone.  One story home.  Has 4 children.  Education: doctorate in theology.    Caffeine use: Drinks 1 cup coffee/day       Originally from [place]. Previously has lived in [place]. Prior travel to [place], [place], [place], [place], [place], [place], [place], & [place]. Previously worked in [employer] She has a dog currently. No bird, mold, or hot tub exposure. She also pastors a church.    Social Determinants of Health   Financial Resource Strain:   . Difficulty of Paying Living Expenses: Not on file  Food Insecurity:   . Worried About Charity fundraiser in the Last Year: Not on file  . Ran Out of Food in the Last Year: Not on file  Transportation Needs:   . Lack of Transportation (Medical): Not on file  . Lack of Transportation (Non-Medical): Not on file  Physical Activity:   . Days of Exercise per Week: Not on file  . Minutes of Exercise per Session: Not on file  Stress:   . Feeling of Stress : Not on file  Social Connections:   . Frequency of Communication with Friends and Family: Not on file  . Frequency of Social Gatherings with Friends and Family: Not on file  . Attends Religious Services: Not on file  . Active Member of Clubs or Organizations: Not on file  . Attends Archivist Meetings: Not on file  . Marital Status: Not on file    Family History  Problem Relation Age of Onset  . Heart disease Maternal Grandfather   . Heart failure Maternal Grandfather   . Diabetes Maternal Grandfather   . Heart attack Father   . Diabetes Mother   . Rheum arthritis Sister   . Emphysema Paternal Uncle   . Esophageal cancer Brother 9       she said he was born with it  . Emphysema Paternal Aunt   . Healthy Child   . Neuropathy Neg Hx   . Multiple sclerosis Neg Hx   . Colon cancer Neg Hx   . Colon polyps Neg Hx   . Rectal cancer Neg Hx   . Stomach cancer Neg Hx     Review of Systems  Constitutional: Negative for fever.  Respiratory: Positive for cough (related to asthma). Negative for shortness of breath and wheezing.   Cardiovascular: Positive for leg swelling (b/l). Negative for chest pain  and palpitations.  Neurological: Positive for dizziness and headaches.  Psychiatric/Behavioral: Positive for dysphoric mood and sleep disturbance (on medication). Negative for decreased concentration. The patient is nervous/anxious (a little).        Decreased motivation       Objective:   Vitals:   12/19/19 1352  BP: 124/68  Pulse: 80  Resp: 16  Temp: 98.4 F (36.9 C)   BP Readings from Last 3 Encounters:  12/19/19 124/68  10/30/19 140/60  06/05/19 128/82   Wt Readings from Last 3 Encounters:  12/19/19 180 lb (81.6 kg)  06/05/19 194 lb 9.6 oz (88.3 kg)  03/20/19 196 lb (88.9 kg)   Body mass index is 32.92 kg/m.   Physical Exam    Constitutional: Appears well-developed and well-nourished. No distress.  HENT:  Head: Normocephalic and atraumatic.  Neck: Neck supple. No tracheal deviation  present. No thyromegaly present.  No cervical lymphadenopathy Cardiovascular: Normal rate, regular rhythm and normal heart sounds.   No murmur heard. No carotid bruit .  No edema Pulmonary/Chest: Effort normal and breath sounds normal. No respiratory distress. No has no wheezes. No rales.  Skin: Skin is warm and dry. Not diaphoretic.  Psychiatric: Normal mood and affect. Behavior is normal.      Assessment & Plan:    See Problem List for Assessment and Plan of chronic medical problems.    This visit occurred during the SARS-CoV-2 public health emergency.  Safety protocols were in place, including screening questions prior to the visit, additional usage of staff PPE, and extensive cleaning of exam room while observing appropriate contact time as indicated for disinfecting solutions.

## 2019-12-19 ENCOUNTER — Encounter: Payer: Self-pay | Admitting: Internal Medicine

## 2019-12-19 ENCOUNTER — Other Ambulatory Visit: Payer: Self-pay

## 2019-12-19 ENCOUNTER — Ambulatory Visit (INDEPENDENT_AMBULATORY_CARE_PROVIDER_SITE_OTHER): Payer: Medicare Other | Admitting: Internal Medicine

## 2019-12-19 VITALS — BP 124/68 | HR 80 | Temp 98.4°F | Resp 16 | Ht 62.0 in | Wt 180.0 lb

## 2019-12-19 DIAGNOSIS — R0989 Other specified symptoms and signs involving the circulatory and respiratory systems: Secondary | ICD-10-CM | POA: Diagnosis not present

## 2019-12-19 DIAGNOSIS — R42 Dizziness and giddiness: Secondary | ICD-10-CM

## 2019-12-19 DIAGNOSIS — K219 Gastro-esophageal reflux disease without esophagitis: Secondary | ICD-10-CM | POA: Diagnosis not present

## 2019-12-19 DIAGNOSIS — D649 Anemia, unspecified: Secondary | ICD-10-CM | POA: Diagnosis not present

## 2019-12-19 DIAGNOSIS — E538 Deficiency of other specified B group vitamins: Secondary | ICD-10-CM

## 2019-12-19 DIAGNOSIS — R252 Cramp and spasm: Secondary | ICD-10-CM | POA: Diagnosis not present

## 2019-12-19 DIAGNOSIS — E7849 Other hyperlipidemia: Secondary | ICD-10-CM

## 2019-12-19 DIAGNOSIS — E1059 Type 1 diabetes mellitus with other circulatory complications: Secondary | ICD-10-CM | POA: Diagnosis not present

## 2019-12-19 DIAGNOSIS — E559 Vitamin D deficiency, unspecified: Secondary | ICD-10-CM

## 2019-12-19 DIAGNOSIS — G4709 Other insomnia: Secondary | ICD-10-CM

## 2019-12-19 LAB — CBC WITH DIFFERENTIAL/PLATELET
Basophils Absolute: 0 10*3/uL (ref 0.0–0.1)
Basophils Relative: 0.7 % (ref 0.0–3.0)
Eosinophils Absolute: 0.2 10*3/uL (ref 0.0–0.7)
Eosinophils Relative: 2.4 % (ref 0.0–5.0)
HCT: 37.4 % (ref 36.0–46.0)
Hemoglobin: 12.1 g/dL (ref 12.0–15.0)
Lymphocytes Relative: 36.2 % (ref 12.0–46.0)
Lymphs Abs: 2.4 10*3/uL (ref 0.7–4.0)
MCHC: 32.4 g/dL (ref 30.0–36.0)
MCV: 81.1 fl (ref 78.0–100.0)
Monocytes Absolute: 0.4 10*3/uL (ref 0.1–1.0)
Monocytes Relative: 6.6 % (ref 3.0–12.0)
Neutro Abs: 3.5 10*3/uL (ref 1.4–7.7)
Neutrophils Relative %: 54.1 % (ref 43.0–77.0)
Platelets: 285 10*3/uL (ref 150.0–400.0)
RBC: 4.61 Mil/uL (ref 3.87–5.11)
RDW: 16.7 % — ABNORMAL HIGH (ref 11.5–15.5)
WBC: 6.6 10*3/uL (ref 4.0–10.5)

## 2019-12-19 LAB — LIPID PANEL
Cholesterol: 205 mg/dL — ABNORMAL HIGH (ref 0–200)
HDL: 37.1 mg/dL — ABNORMAL LOW (ref >39.00)
LDL Cholesterol: 135 mg/dL — ABNORMAL HIGH (ref 0–99)
NonHDL: 167.72
Total CHOL/HDL Ratio: 6
Triglycerides: 162 mg/dL — ABNORMAL HIGH (ref 0.0–149.0)
VLDL: 32.4 mg/dL (ref 0.0–40.0)

## 2019-12-19 LAB — HEMOGLOBIN A1C: Hgb A1c MFr Bld: 8.1 % — ABNORMAL HIGH (ref 4.6–6.5)

## 2019-12-19 LAB — COMPREHENSIVE METABOLIC PANEL
ALT: 9 U/L (ref 0–35)
AST: 14 U/L (ref 0–37)
Albumin: 4.1 g/dL (ref 3.5–5.2)
Alkaline Phosphatase: 121 U/L — ABNORMAL HIGH (ref 39–117)
BUN: 25 mg/dL — ABNORMAL HIGH (ref 6–23)
CO2: 34 mEq/L — ABNORMAL HIGH (ref 19–32)
Calcium: 9.1 mg/dL (ref 8.4–10.5)
Chloride: 102 mEq/L (ref 96–112)
Creatinine, Ser: 0.93 mg/dL (ref 0.40–1.20)
GFR: 57.76 mL/min — ABNORMAL LOW (ref >60.00)
Glucose, Bld: 140 mg/dL — ABNORMAL HIGH (ref 70–99)
Potassium: 4.4 mEq/L (ref 3.5–5.1)
Sodium: 140 mEq/L (ref 135–145)
Total Bilirubin: 0.3 mg/dL (ref 0.2–1.2)
Total Protein: 7.4 g/dL (ref 6.0–8.3)

## 2019-12-19 LAB — VITAMIN D 25 HYDROXY (VIT D DEFICIENCY, FRACTURES): VITD: 27.21 ng/mL — ABNORMAL LOW (ref 30.00–100.00)

## 2019-12-19 MED ORDER — NORTRIPTYLINE HCL 50 MG PO CAPS: 100.0000 mg | ORAL_CAPSULE | Freq: Every day | ORAL | 1 refills | Status: DC

## 2019-12-19 MED ORDER — CYANOCOBALAMIN 1000 MCG/ML IJ SOLN
1000.0000 ug | Freq: Once | INTRAMUSCULAR | Status: AC
  Administered 2019-12-19: 1000 ug via INTRAMUSCULAR

## 2019-12-19 NOTE — Assessment & Plan Note (Signed)
Chronic Management per Dr Chalmers Cater a1c today

## 2019-12-19 NOTE — Assessment & Plan Note (Signed)
Chronic Still having cramping Continue baclofen 40 mg BID

## 2019-12-19 NOTE — Assessment & Plan Note (Signed)
Chronic Check lipid panel  Lifestyle controlled Regular exercise and healthy diet encouraged  

## 2019-12-19 NOTE — Assessment & Plan Note (Signed)
Chronic  cbc 

## 2019-12-19 NOTE — Assessment & Plan Note (Signed)
Chronic Monthly B12 injection - one today

## 2019-12-19 NOTE — Assessment & Plan Note (Addendum)
Chronic GERD still having having it some nights Continue aciphex 20 mg daily and pepcid 40 mg daily

## 2019-12-19 NOTE — Assessment & Plan Note (Signed)
Chronic BP well controlled Continue diltiazem 300mg  daily, metoprolol XL 100 mg daily cmp

## 2019-12-19 NOTE — Assessment & Plan Note (Signed)
Chronic, intermittent Had a recent episode that lasted 2-3 days Takes meclizine 25 mg TID prn Advised to do Epley maneuver when she has the symptoms Now only has vertigo w/ quick movements  She will let me know if she wants to do PT

## 2019-12-19 NOTE — Assessment & Plan Note (Signed)
Chronic Taking nortripytyline 75 mg daily - will increase to 100 mg nightly for depression

## 2019-12-20 ENCOUNTER — Telehealth: Payer: Self-pay

## 2019-12-20 NOTE — Telephone Encounter (Signed)
Results given.

## 2019-12-26 ENCOUNTER — Encounter: Payer: Medicare Other | Admitting: *Deleted

## 2019-12-26 ENCOUNTER — Other Ambulatory Visit: Payer: Self-pay

## 2019-12-26 DIAGNOSIS — Z006 Encounter for examination for normal comparison and control in clinical research program: Secondary | ICD-10-CM

## 2019-12-26 NOTE — Research (Signed)
Subject came to research clinic today for ORION 4, Visit 6 today. Subject's concomitant medications have been reviewed and updated if applicable. There are no new AE's or SAE's to report to sponsor at this time. Subject was given IP injection subcutaneously into their left arm. Subject tolerated well. Next appointment scheduled for Tuesday, May 17th, 2022 @ 11:00am.

## 2020-01-01 ENCOUNTER — Other Ambulatory Visit: Payer: Self-pay | Admitting: Internal Medicine

## 2020-01-01 ENCOUNTER — Other Ambulatory Visit: Payer: Self-pay | Admitting: Neurology

## 2020-02-05 ENCOUNTER — Other Ambulatory Visit: Payer: Self-pay | Admitting: Chiropractic Medicine

## 2020-02-05 DIAGNOSIS — M8618 Other acute osteomyelitis, other site: Secondary | ICD-10-CM

## 2020-02-07 ENCOUNTER — Other Ambulatory Visit: Payer: Self-pay | Admitting: Cardiovascular Disease

## 2020-02-11 ENCOUNTER — Encounter: Payer: Self-pay | Admitting: Cardiovascular Disease

## 2020-02-11 ENCOUNTER — Ambulatory Visit (INDEPENDENT_AMBULATORY_CARE_PROVIDER_SITE_OTHER): Payer: Medicare Other | Admitting: Cardiovascular Disease

## 2020-02-11 ENCOUNTER — Other Ambulatory Visit: Payer: Self-pay

## 2020-02-11 VITALS — BP 126/76 | HR 77 | Ht 62.0 in | Wt 196.2 lb

## 2020-02-11 DIAGNOSIS — E782 Mixed hyperlipidemia: Secondary | ICD-10-CM

## 2020-02-11 DIAGNOSIS — I25709 Atherosclerosis of coronary artery bypass graft(s), unspecified, with unspecified angina pectoris: Secondary | ICD-10-CM

## 2020-02-11 DIAGNOSIS — I10 Essential (primary) hypertension: Secondary | ICD-10-CM | POA: Diagnosis not present

## 2020-02-11 DIAGNOSIS — I200 Unstable angina: Secondary | ICD-10-CM | POA: Diagnosis not present

## 2020-02-11 MED ORDER — NEXLETOL 180 MG PO TABS: 180.0000 mg | ORAL_TABLET | Freq: Every day | ORAL | 3 refills | Status: DC

## 2020-02-11 NOTE — Patient Instructions (Addendum)
Medication Instructions:  Your physician has recommended you make the following change in your medication:   ** Begin Nexletol 180mg  - 1 tablet by mouth daily   *If you need a refill on your cardiac medications before your next appointment, please call your pharmacy*   Lab Work: None ordered.  If you have labs (blood work) drawn today and your tests are completely normal, you will receive your results only by: MyChart Message (if you have MyChart) OR . A paper copy in the mail If you have any lab test that is abnormal or we need to change your treatment, we will call you to review the results.   Testing/Procedures: Your physician has requested that you have a lexiscan myoview. For further information please visit Marland Kitchen. Please follow instruction sheet, as given.     Follow-Up: At [facility], you and your health needs are our priority.  As part of our continuing mission to provide you with exceptional heart care, we have created designated Provider Care Teams.  These Care Teams include your primary Cardiologist (physician) and Advanced Practice Providers (APPs -  Physician Assistants and Nurse Practitioners) who all work together to provide you with the care you need, when you need it.  We recommend signing up for the patient portal called "MyChart".  Sign up information is provided on this After Visit Summary.  MyChart is used to connect with patients for Virtual Visits (Telemedicine).  Patients are able to view lab/test results, encounter notes, upcoming appointments, etc.  Non-urgent messages can be sent to your provider as well.   To learn more about what you can do with MyChart, go to [facility].    Your next appointment:   3 month(s)  The format for your next appointment:   In Person  Provider:   You will see one of the following Advanced Practice Providers on your designated Care Team:    [email or web address], PA-C  Vin [place],  [place]

## 2020-02-11 NOTE — Progress Notes (Signed)
Cardiology Office Note   Date:  02/11/2020   ID:  [name], DOB 06/29/1940, MRN [id number]  PCP:  Binnie Rail, MD  Cardiologist:   Mertie Moores, MD   Chief Complaint  Patient presents with  . Coronary Artery Disease   1. Coronary artery disease-status post CABG, the saphenous vein graft to the right coronary artery is severely diseased but she has good flow to the distal right coronary artery from the native circulation. 2. Diabetes mellitus 3. Hypertension 4. Hyperlipidemia 5. COPD: June, 2013 - FEV1 equals 0.89 L which is 48% of predicted value. Her DLCO is also markedly depressed. 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   [name] is a 80 y.o. female with the above noted hx. She continues to have episodes of sharp pain ( few seconds) followed by a burning pain ( last 6-7 minutes). She usually does not take NTG because it causes a headache. She was started on Lasix earlier this year. She seems to be doing much better on Lasix. She's had only function test which revealed severe obstructive lung disease with a good initial response to bronchodilators.  She is scheduled to have surgery all right knee. She's here today for preoperative evaluation prior to having the surgery.  September 04, 2012:  [name] is seen today for a follow up visit. She saw Richardson Dopp in April and was started on bystolic. She has tolerated that fairly well. She has some episodes of CP that are constant. She is very stable.   03/01/2013:  She continues to have significant CP. Still short of breath. She has no energy. Just does not feel well in general.  She does not get much sleep - perhaps 3 hours a night.   Jan. 8, 2016:  [name] is a 80 yo who we follow for CAD, DM, HTN, hyperlipidemia, . She has COPD: FEV1 equals 0.89 L which is 48% of predicted value. Her DLCO is also markedly depressed. She has severe obstructive lung disease with a  beneficial response to bronchodilators.  Breathing is the same. Has some upper respiratory issues.  Still has some tightness.    Jun 17, 2014:    [name] is a 80 y.o. female who presents for episodes of CP    Started 1 1/2 months ago.  Has tried mylanta - thought it was gas.  Did not get any better Occurs wjith exertion or at rest. , left sided chest pain   No radiation Gets lightheaded. Has tried NTG which seems to help.    Nov. 15 2016:  Has not been feeling well for the past couple of months Has a chest pressure when she gets out of bed.  Occurs twice a day - in the AM when she gets up  And then again at 3:30 in the afternoon.   Is active between those times , makes home visit.   Does water aerobics without any CP / pressure.   Wears home O2 at night.     Jun 23, 2015: Doing the same.  Still has the same chest pain  Last cath was in 2014 - showed patent LIMA to LAD, SVG to D1, the SVG to RCA was occluded   Native RCA has a 40-50% prox stenosis   The pains occur typically twice a day - Upon awakening and around 7 PM.   Nov. 20, 2017:  Has had lots of fatigue / pressure in her chest .  Increased shortness of breath walking a short distance  Has COPD - had issues with bronchitis ,  Was on prednisone  Pain was relieved with NTG and also by taking additional aspirin   April 08, 2016:  She has had some GI bleeding from diverticulitis  Was in the hospital for 4 days  Has been seeing Dr. Fuller Plan.   Aug. 14, 2018: [name] is seen today for follow up    GI bleeding has resolved  Breathing is still an issue.  Feet are swollen by the end of the day  ( no swelling this am )   Jun 24, 2017:  Her BP has been variable  Her dyspnea has been worse recently . Having trouble sleeping at night.   Has been given home O2 .   Never arrived   Feb. 9, 2021  Was sick for 2 weeks .  She tested negative for covid 5 days ago .     She is getting better. Son was recently diagnosed  with COVID.   She is concerned that she may have it despite her negative test 5 days ago    She did not lose her taste or smell  Jan. 3, 2022:  [name] is seen today for follow up of her CAD She has severe COPD and severely reduced diffusion capacity . She lost her son to [place] last march. Encouraged her to get some grief counceling   Still has some chest pain - seems to be related to the stress of the death of her son . The pains last 10-15 minutes  She is on Imdur.   Takes NTG on occasion.  Also has some sternal wire pain / positional pain    Past Medical History:  Diagnosis Date  . Adenomatous colon polyp   . Allergy   . Anxiety   . Asthma   . Carotid artery disease ([place])    carotid US 02/2017: bilat ICA 1-39%  . Chronic diastolic CHF    Echo 04/3352: EF 65-70, Gr 2 DD, mild MS (mean 5), PASP 44  . COPD    pt is unsure if has been officially diagnosed  . Coronary artery disease    CABG '09- cathed 12/09, 9/10, 6/11, 3/14 and 12/13/16- medical Rx // cath 12/2016 - 2/3 grafts patent >> med Rx // Myoview 12/17: low risk  . Diabetes mellitus   . Gastroesophageal reflux disease   . Hiatal hernia   . 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 Hyperlipidemia   . Hypertension   . PONV (postoperative nausea and vomiting)   . Schatzki's ring   . Shoulder injury    resolved after shoulder surgery  . Sleep apnea    not on cpap    Past Surgical History:  Procedure Laterality Date  . ABDOMINAL HYSTERECTOMY    . APPENDECTOMY     came out with Hysterectomy  . CARDIAC CATHETERIZATION  07/23/2009   EF 60%  . CARDIAC CATHETERIZATION  10/11/2008  . CARDIAC CATHETERIZATION  03/01/2007   EF 75-80%  . CARDIOVASCULAR STRESS TEST  11/15/2007   EF 60%  . COLONOSCOPY    . CORONARY ARTERY BYPASS GRAFT     SEVERELY DISEASED SAPHENOUS VEIN GRAFT TO THE RIGHT CORONARY ARTERY BUT WITH FAIRLY WELL PRESERVED FLOW TO THE DISTAL RIGHT CORONARY ARTERY FROM THE NATIVE CIRCULATION-RESTART  CATH IN  JUNE 2000, REVEALS MILD/MODERATE  CAD WITH GOOD FLOW DOWN HER LAD  . ESOPHAGOGASTRODUODENOSCOPY    .  EYE SURGERY     bilateral cataract surgery with lens implant  . LEFT HEART CATH AND CORS/GRAFTS ANGIOGRAPHY N/A 12/14/2016   Procedure: LEFT HEART CATH AND CORS/GRAFTS ANGIOGRAPHY;  Surgeon: Jettie Booze, MD;  Location: [facility];  Service: Cardiovascular;  Laterality: N/A;  . lense removal Left   . POLYPECTOMY    . ROTATOR CUFF REPAIR     right and left  . TONSILLECTOMY     age 31  . 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 tumor removed kidney    . UPPER GASTROINTESTINAL ENDOSCOPY    . US ECHOCARDIOGRAPHY  03/08/2008   EF 55-60%     Current Outpatient Medications  Medication Sig Dispense Refill  . albuterol (PROVENTIL HFA;VENTOLIN HFA) 108 (90 BASE) MCG/ACT inhaler Inhale 2 puffs into the lungs every 6 (six) hours as needed. For shortness of breath. 1 Inhaler 0  . albuterol (PROVENTIL) (2.5 MG/3ML) 0.083% nebulizer solution USE 1 VIAL IN NEBULIZER EVERY 6 HOURS - and as needed 360 mL 1  . alum & mag hydroxide-simeth (MAALOX/MYLANTA) 200-200-20 MG/5ML suspension Take 30 mLs as needed by mouth for indigestion or heartburn.    Marland Kitchen aspirin EC 81 MG tablet Take 1 tablet (81 mg total) by mouth daily.    . baclofen (LIORESAL) 20 MG tablet Take 2 tablets (40 mg total) by mouth 2 (two) times daily. Due for f/u appt. 360 tablet 2  . Bempedoic Acid (NEXLETOL) 180 MG TABS Take 180 mg by mouth daily. 90 tablet 3  . benzonatate (TESSALON) 200 MG capsule Take 1 capsule (200 mg total) by mouth 3 (three) times daily as needed for cough. 30 capsule 1  . Blood Glucose Monitoring Suppl (ONE TOUCH ULTRA MINI) w/Device KIT 3 (three) times daily. for testing  0  . calcium carbonate (OS-CAL) 1250 (500 Ca) MG chewable tablet Chew 1 tablet by mouth as needed.     . calcium carbonate (TUMS - DOSED IN MG ELEMENTAL CALCIUM) 500 MG chewable tablet Chew 2 tablets daily as needed by mouth for indigestion or heartburn.    . Cholecalciferol (VITAMIN D) 2000 UNITS tablet Take 2,000 Units by mouth daily.    . clonazePAM (KLONOPIN) 0.5 MG tablet Take 0.5 mg daily by mouth.    . clopidogrel (PLAVIX) 75 MG tablet Take 75 mg by mouth daily.    . Cyanocobalamin (VITAMIN B-12 IJ) Inject 1,000 mcg as directed every 30 (thirty) days.    . cyclobenzaprine (FLEXERIL) 5 MG tablet Take 1 tablet by mouth as needed.    . diclofenac sodium (VOLTAREN) 1 % GEL Apply 2 g topically 4 (four) times daily as needed (muscle pain).    Marland Kitchen diltiazem (CARDIZEM CD) 300 MG 24 hr capsule TAKE 1 CAPSULE (300 MG TOTAL) BY MOUTH DAILY. MUST KEEP SCHEDULED APPT FOR FUTURE REFILLS 90 capsule 1  . diltiazem (TIAZAC) 300 MG 24 hr capsule diltiazem CD 300 mg capsule,extended release 24 hr  TAKE 1 CAPSULE (300 MG TOTAL) BY MOUTH DAILY. MUST KEEP SCHEDULED APPT FOR FUTURE REFILLS    . ezetimibe (ZETIA) 10 MG tablet Take 10 mg by mouth daily.  6  . famotidine (PEPCID) 40 MG tablet Take 1 tablet (40 mg total) by mouth daily as needed. 90 tablet 1  . fexofenadine (ALLEGRA) 180 MG tablet TAKE 1 TABLET BY MOUTH EVERY DAY 90 tablet 3  .  Fluticasone-Salmeterol (ADVAIR DISKUS) 250-50 MCG/DOSE AEPB Inhale 1 puff into the lungs 2 (two) times daily. 60 each 5  . Fluticasone-Umeclidin-Vilant (TRELEGY ELLIPTA) 200-62.5-25 MCG/INH AEPB Inhale 1 puff into the lungs daily. 14 each 0  . furosemide (LASIX) 40 MG tablet Take 1 tablet (40 mg total) by mouth daily. 90 tablet 3  . insulin lispro (INSULIN LISPRO) 100 UNIT/ML KwikPen Junior Inject 10-15 Units into the skin daily.    . insulin lispro protamine-insulin lispro (HUMALOG 75/25) (75-25) 100 UNIT/ML SUSP Inject 15-22 Units into the skin See admin instructions. Takes 16 units in the morning, 10 units at lunch, and 25  units with supper    . isosorbide mononitrate  (IMDUR) 60 MG 24 hr tablet TAKE 1.5 TABLETS (90 MG TOTAL) BY MOUTH DAILY. 135 tablet 1  . lisinopril (ZESTRIL) 20 MG tablet Take 1 tablet by mouth daily.    Marland Kitchen loperamide (IMODIUM) 2 MG capsule Take 2 mg by mouth as needed for diarrhea or loose stools.     . meclizine (ANTIVERT) 25 MG tablet Take 1 tablet (25 mg total) by mouth 2 (two) times daily as needed for dizziness. 30 tablet 2  . metoprolol succinate (TOPROL-XL) 100 MG 24 hr tablet TAKE 1 TABLET BY MOUTH EVERY DAY 30 tablet 1  . montelukast (SINGULAIR) 5 MG chewable tablet CHEW 1 TABLET (5 MG TOTAL) BY MOUTH AT BEDTIME. 90 tablet 1  . Multiple Vitamin (MULTIVITAMIN WITH MINERALS) TABS Take 1 tablet by mouth daily.    . Nebulizers (COMPRESSOR/NEBULIZER) MISC Use with albuterol 1 each 0  . nitroGLYCERIN (NITROSTAT) 0.4 MG SL tablet Place 0.4 mg under the tongue every 5 (five) minutes as needed for chest pain.    . nortriptyline (PAMELOR) 50 MG capsule Take 2 capsules (100 mg total) by mouth at bedtime. 180 capsule 1  . NUCYNTA 50 MG tablet Take by mouth.    . ONE TOUCH ULTRA TEST test strip CHECK BLOOD SUGAR TWICE A DAY DX: E11.9  3  . ONETOUCH DELICA LANCETS 35T MISC 3 (three) times daily. for testing  0  . RABEprazole (ACIPHEX) 20 MG tablet TAKE 1 TABLET (20 MG TOTAL) BY MOUTH DAILY. 90 tablet 1  . Spacer/Aero-Holding Chambers (AEROCHAMBER MV) inhaler Use as instructed 1 each 0  . Study - ORION 4 - inclisiran 300 mg/1.68m or placebo SQ injection (PI-Stuckey) Inject 300 mg into the skin every 6 (six) months.    . Tiotropium Bromide Monohydrate (SPIRIVA RESPIMAT) 2.5 MCG/ACT AERS Inhale 2 puffs into the lungs daily. 4 g 6   Current Facility-Administered Medications  Medication Dose Route Frequency Provider Last Rate Last Admin  . cyanocobalamin ((VITAMIN B-12)) injection 1,000 mcg  1,000 mcg Intramuscular Q30 days BBinnie Rail MD   1,000 mcg at 03/26/16 1658    Allergies:   Amlodipine, Banana, Co q10 [coenzyme q10], Codeine, Metformin  and related, Morphine, Pentazocine, Pravastatin, Repatha [evolocumab], Statins, Sulfa antibiotics, Sulfonamide derivatives, and Tramadol hcl    Social History:  The patient  reports that she quit smoking about 44 years ago. Her smoking use included cigarettes. She started smoking about 65 years ago. She has a 10.50 pack-year smoking history. She has never used smokeless tobacco. She reports that she does not drink alcohol and does not use drugs.   Family History:  The patient's family history includes Diabetes in her maternal grandfather and mother; Emphysema in her paternal aunt and paternal uncle; Esophageal cancer (age of onset: 337 in her brother; Healthy in her child;  Heart attack in her father; Heart disease in her maternal grandfather; Heart failure in her maternal grandfather; Rheum arthritis in her sister.    ROS:   Noted in current history, all other systems are negative.  Physical Exam: Blood pressure 126/76, pulse 77, height _0  (1.575 m), weight 196 lb 3.2 oz (89 kg).  GEN:  Elderly female,   nad  HEENT: Normal NECK: No JVD; No carotid bruits LYMPHATICS: No lymphadenopathy CARDIAC: RRR , mid sternal tenderness  RESPIRATORY:  Clear to auscultation without rales, wheezing or rhonchi  ABDOMEN: Soft, non-tender, non-distended MUSCULOSKELETAL:  No edema; No deformity  SKIN: Warm and dry NEUROLOGIC:  Alert and oriented x 3    EKG:     Jan. 3, 2022: NSR at 77.   RBBB , no ST or T wave changes.   Recent Labs: 02/12/2019: TSH 0.76 12/19/2019: ALT 9; BUN 25; Creatinine, Ser 0.93; Hemoglobin 12.1; Platelets 285.0; Potassium 4.4; Sodium 140    Lipid Panel    Component Value Date/Time   CHOL 205 (H) 12/19/2019 1437   CHOL 119 12/22/2016 0913   TRIG 162.0 (H) 12/19/2019 1437   HDL 37.10 (L) 12/19/2019 1437   HDL 43 12/22/2016 0913   CHOLHDL 6 12/19/2019 1437   VLDL 32.4 12/19/2019 1437   LDLCALC 135 (H) 12/19/2019 1437   LDLCALC 62 12/22/2016 0913      Wt Readings from  Last 3 Encounters:  02/11/20 196 lb 3.2 oz (89 kg)  12/19/19 180 lb (81.6 kg)  06/05/19 194 lb 9.6 oz (88.3 kg)      Other studies Reviewed: Additional studies/ records that were reviewed today include: . Review of the above records demonstrates:    ASSESSMENT AND PLAN:  1. Coronary artery disease-she says that the chest pain that she has had off and on for years is now worsening and now last for 10 to 15 minutes.  It is relieved with nitroglycerin.  Her last stress Myoview study was approximately 4 to 5 years ago.  She had a heart catheterization 3 years ago which did not reveal any significant changes.  We will set her up for repeat [place] study.    3. Hypertension -    BP is stable   . 4. Hyperlipidemia -  She is ontolerent to statins and also to Repatha .  She has been seen by the lipid clinic.  Will try Nexletol 180 mg a day   5. COPD: June, 2013 -    6.  ? COVID:      Current medicines are reviewed at length with the patient today.  The patient does not have concerns regarding medicines.  The following changes have been made:  no change  Labs/ tests ordered today include:   Orders Placed This Encounter  Procedures  . Myocardial Perfusion Imaging  . MYOCARDIAL PERFUSION IMAGING  . EKG 12-Lead     Disposition:    To see APP in 3 months     Mertie Moores, MD  02/11/2020 5:34 PM    [address], [place], [place]  [address] Phone: [phone]; Fax: [phone]

## 2020-02-12 ENCOUNTER — Other Ambulatory Visit: Payer: Self-pay

## 2020-02-12 DIAGNOSIS — I25118 Atherosclerotic heart disease of native coronary artery with other forms of angina pectoris: Secondary | ICD-10-CM

## 2020-02-12 NOTE — Progress Notes (Signed)
Please sign attestation order for Myoview.

## 2020-02-13 ENCOUNTER — Telehealth: Payer: Self-pay

## 2020-02-13 NOTE — Telephone Encounter (Addendum)
**Note De-Identified Jasmina Gendron Obfuscation** I started a Nexletol PA through covermymeds. KeyBurnard Leigh: [id number]

## 2020-02-14 ENCOUNTER — Ambulatory Visit
Admission: RE | Admit: 2020-02-14 | Discharge: 2020-02-14 | Disposition: A | Discharge status: Home / Self Care | Payer: Medicare Other | Source: Ambulatory Visit | Attending: Chiropractic Medicine | Admitting: Chiropractic Medicine

## 2020-02-14 ENCOUNTER — Telehealth (HOSPITAL_COMMUNITY): Payer: Self-pay | Admitting: *Deleted

## 2020-02-14 ENCOUNTER — Other Ambulatory Visit: Payer: Self-pay

## 2020-02-14 DIAGNOSIS — M8618 Other acute osteomyelitis, other site: Secondary | ICD-10-CM

## 2020-02-14 MED ORDER — IOPAMIDOL (ISOVUE-M 200) INJECTION 41%
1.0000 mL | Freq: Once | INTRAMUSCULAR | Status: AC
  Administered 2020-02-14: 1 mL via INTRA_ARTICULAR

## 2020-02-14 MED ORDER — METHYLPREDNISOLONE ACETATE 40 MG/ML INJ SUSP (RADIOLOG
120.0000 mg | Freq: Once | INTRAMUSCULAR | Status: AC
  Administered 2020-02-14: 60 mg via EPIDURAL

## 2020-02-14 NOTE — Discharge Instructions (Signed)

## 2020-02-14 NOTE — Telephone Encounter (Signed)
Patient given detailed instructions per Myocardial Perfusion Study Information Sheet for the test on 12/10/20 Patient notified to arrive 15 minutes early and that it is imperative to arrive on time for appointment to keep from having the test rescheduled.  If you need to cancel or reschedule your appointment, please call the office within 24 hours of your appointment. . Patient verbalized understanding. [name]   

## 2020-02-15 DIAGNOSIS — I2510 Atherosclerotic heart disease of native coronary artery without angina pectoris: Secondary | ICD-10-CM | POA: Insufficient documentation

## 2020-02-15 NOTE — Telephone Encounter (Signed)
**Note De-Identified Jeny Nield Obfuscation** Nexletol form received Druanne Bosques fax from [employer] concerning this PA. I have completed the form, included Dr Elmarie Shiley office visit notes from 02/11/20, and emailed all to our one of our pharmacist to look over and to correct any mistakes and/or to add to if there is anything else she feels will help to get approval.

## 2020-02-15 NOTE — Telephone Encounter (Signed)
I would also include the statin she failed including atorvastatin 40mg , prvastatin 20mg  and Repatha.

## 2020-02-18 NOTE — Telephone Encounter (Signed)
**Note De-Identified Phillis Thackeray Obfuscation** I have added that the pt has been intolerant to Atorvastatin, Pravastatin, and Repatha to the [facility] PA form. I have emailed all to Janan Halter (clinical supervisor) so she can fax all as is to [facility] at the fax number written on cover letter included.

## 2020-02-19 NOTE — Telephone Encounter (Signed)
All information faxed to [employer].

## 2020-02-20 ENCOUNTER — Other Ambulatory Visit: Payer: Self-pay

## 2020-02-20 ENCOUNTER — Ambulatory Visit (HOSPITAL_COMMUNITY): Payer: Medicare Other | Attending: Internal Medicine

## 2020-02-20 DIAGNOSIS — I200 Unstable angina: Secondary | ICD-10-CM | POA: Diagnosis not present

## 2020-02-20 LAB — MYOCARDIAL PERFUSION IMAGING
LV dias vol: 63 mL (ref 46–106)
LV sys vol: 26 mL
Peak HR: 83 {beats}/min
Rest HR: 65 {beats}/min
SDS: 6
SRS: 0
SSS: 6
TID: 0.98

## 2020-02-20 MED ORDER — TECHNETIUM TC 99M TETROFOSMIN IV KIT
32.5000 | PACK | Freq: Once | INTRAVENOUS | Status: AC | PRN
  Administered 2020-02-20: 32.5 via INTRAVENOUS
  Filled 2020-02-20: qty 33

## 2020-02-20 MED ORDER — REGADENOSON 0.4 MG/5ML IV SOLN
0.4000 mg | Freq: Once | INTRAVENOUS | Status: AC
  Administered 2020-02-20: 0.4 mg via INTRAVENOUS

## 2020-02-20 MED ORDER — TECHNETIUM TC 99M TETROFOSMIN IV KIT
10.8000 | PACK | Freq: Once | INTRAVENOUS | Status: AC | PRN
  Administered 2020-02-20: 10.8 via INTRAVENOUS
  Filled 2020-02-20: qty 11

## 2020-03-02 ENCOUNTER — Other Ambulatory Visit: Payer: Self-pay | Admitting: Cardiovascular Disease

## 2020-03-02 ENCOUNTER — Other Ambulatory Visit: Payer: Self-pay | Admitting: Neurology

## 2020-03-03 ENCOUNTER — Telehealth: Payer: Self-pay

## 2020-03-03 NOTE — Telephone Encounter (Signed)
pamelor 75 denied. Contacted pharmacy, patient ntbs.

## 2020-03-04 ENCOUNTER — Other Ambulatory Visit: Payer: Self-pay | Admitting: Cardiovascular Disease

## 2020-03-04 NOTE — Telephone Encounter (Signed)
*  STAT* If patient is at the pharmacy, call can be transferred to refill team.   1. Which medications need to be refilled? (please list name of each medication and dose if known) diltiazem (CARDIZEM CD) 300 MG 24 hr capsule isosorbide mononitrate (IMDUR) 60 MG 24 hr tablet 2. Which pharmacy/location (including street and city if local pharmacy) is medication to be sent to? [id number] [place], [place] [address]  3. Do they need a 30 day or 90 day supply? 90 day supply  Patient states refill was denied by the pharmacy because note states she needs an appointment. However, she was seen on 02/11/20.

## 2020-03-05 MED ORDER — DILTIAZEM HCL ER COATED BEADS 300 MG PO CP24: 300.0000 mg | ORAL_CAPSULE | Freq: Every day | ORAL | 3 refills | Status: DC

## 2020-03-05 MED ORDER — ISOSORBIDE MONONITRATE ER 60 MG PO TB24: 90.0000 mg | ORAL_TABLET | Freq: Every day | ORAL | 3 refills | Status: DC

## 2020-03-05 NOTE — Telephone Encounter (Signed)
Pt is requesting a refill on diltiazem 300 mg capsule. This medication was changed by PCP Dr. Quay Burow. Would Dr. Acie Fredrickson like to refill this medication? Please address

## 2020-03-06 ENCOUNTER — Telehealth: Payer: Self-pay | Admitting: Cardiovascular Disease

## 2020-03-06 NOTE — Telephone Encounter (Signed)
New message:      Patient calling stating that she went to go pick up her medication nexletol was 50 dollars and would like to get something cheaper.

## 2020-03-07 NOTE — Telephone Encounter (Signed)
There is not anything cheaper unless we re-challenege with a statin. Could apply for grant from [employer] calling patient but line busy

## 2020-03-12 NOTE — Telephone Encounter (Signed)
Called pt and applied for [employer]. She was approved. Info [facility] to make [facility] Nexletol [facility]. Pt was very appreciative for assistance.

## 2020-04-01 ENCOUNTER — Other Ambulatory Visit: Payer: Self-pay | Admitting: Cardiovascular Disease

## 2020-04-09 ENCOUNTER — Other Ambulatory Visit: Payer: Self-pay | Admitting: Neurology

## 2020-04-14 ENCOUNTER — Other Ambulatory Visit: Payer: Self-pay | Admitting: Internal Medicine

## 2020-04-27 NOTE — Progress Notes (Signed)
Subjective:    Patient ID: [name], female    DOB: 20-Oct-1940, 80 y.o.   MRN: [id number]  HPI The patient is here for an acute visit for lightheadedness; off balance.   Her brother died this morning. She is upset about this.  She is still grieving her son that died one year ago.    She has a long history of vertigo.  She has a flare that is typical of her other flares, but worse.    She is taking meclizine twice a day. It used to work but it is no longer working.  It only lasts about 3 hours.    This feels like an episode she has with vertigo. It started two weeks ago.  She can be standing and will just lean to one side.   Her steps are off.   She has been using a cane.   Not related to head movements.    Her ears are plugged. No sinus pain, sore throat or fever.    Medications and allergies reviewed with patient and updated if appropriate.  Patient Active Problem List   Diagnosis Date Noted  . Coronary artery disease   . Pelvic pain 10/30/2019  . Chest pain at rest 08/24/2018  . Arthralgia 08/01/2017  . COPD with asthma ([place]) 05/04/2017  . Angina pectoris ([place]) 01/05/2017  . Insomnia 11/23/2016  . Chronic nonintractable headache 11/23/2016  . Pancreatic insufficiency 10/28/2016  . Carotid artery disease ([place]) 05/19/2016  . Fatigue 05/19/2016  . Anemia 03/27/2016  . Type 1 diabetes mellitus ([place]) 07/21/2015  . B12 deficiency 03/31/2015  . OSA (obstructive sleep apnea) with hypoxia 11/20/2014  . Fecal incontinence 08/28/2014  . 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 Falls 08/28/2014  . Neck pain of over 3 months duration 08/28/2014  . H/O total knee replacement 02/20/2014  . Obesity (BMI 30-39.9) 11/12/2013  . Chronic diastolic CHF (congestive heart failure) ([place]) 11/17/2012  . Meniscus, lateral, anterior horn derangement 11/10/2011  . Medial meniscus, posterior horn derangement 11/10/2011  . Osteoarthritis of right knee 11/10/2011  . Vertigo 10/05/2011   . Labile hypertension 10/05/2011  . CAD (coronary artery disease) 08/27/2010  . MUSCLE CRAMPS 12/29/2009  . History of myocardial infarction 11/19/2009  . Extrinsic asthma 11/19/2009  . GERD 11/19/2009  . Cough 11/19/2009  . Hyperlipidemia 11/18/2009    Current Outpatient Medications on File Prior to Visit  Medication Sig Dispense Refill  . albuterol (PROVENTIL HFA;VENTOLIN HFA) 108 (90 BASE) MCG/ACT inhaler Inhale 2 puffs into the lungs every 6 (six) hours as needed. For shortness of breath. 1 Inhaler 0  . albuterol (PROVENTIL) (2.5 MG/3ML) 0.083% nebulizer solution USE 1 VIAL IN NEBULIZER EVERY 6 HOURS - and as needed 360 mL 1  . alum & mag hydroxide-simeth (MAALOX/MYLANTA) 200-200-20 MG/5ML suspension Take 30 mLs as needed by mouth for indigestion or heartburn.    Marland Kitchen aspirin EC 81 MG tablet Take 1 tablet (81 mg total) by mouth daily.    . baclofen (LIORESAL) 20 MG tablet Take 2 tablets (40 mg total) by mouth 2 (two) times daily. Due for f/u appt. 360 tablet 2  . Bempedoic Acid (NEXLETOL) 180 MG TABS Take 180 mg by mouth daily. 90 tablet 3  . benzonatate (TESSALON) 200 MG capsule Take 1 capsule (200 mg total) by mouth 3 (three) times daily as needed for cough. 30 capsule 1  . Blood Glucose Monitoring Suppl (ONE TOUCH ULTRA MINI) w/Device KIT 3 (three) times daily. for  testing  0  . calcium carbonate (OS-CAL) 1250 (500 Ca) MG chewable tablet Chew 1 tablet by mouth as needed.    . calcium carbonate (TUMS - DOSED IN MG ELEMENTAL CALCIUM) 500 MG chewable tablet Chew 2 tablets daily as needed by mouth for indigestion or heartburn.    . Cholecalciferol (VITAMIN D) 2000 UNITS tablet Take 2,000 Units by mouth daily.    . clonazePAM (KLONOPIN) 0.5 MG tablet Take 0.5 mg daily by mouth.    . clopidogrel (PLAVIX) 75 MG tablet Take 75 mg by mouth daily.    . Continuous Blood Gluc Sensor (FREESTYLE LIBRE 14 DAY SENSOR) MISC See admin instructions.    . Cyanocobalamin (VITAMIN B-12 IJ) Inject 1,000 mcg  as directed every 30 (thirty) days.    . cyclobenzaprine (FLEXERIL) 5 MG tablet Take 1 tablet by mouth as needed.    . diclofenac sodium (VOLTAREN) 1 % GEL Apply 2 g topically 4 (four) times daily as needed (muscle pain).    Marland Kitchen diltiazem (CARDIZEM CD) 300 MG 24 hr capsule Take 1 capsule (300 mg total) by mouth daily. 90 capsule 3  . famotidine (PEPCID) 40 MG tablet Take 1 tablet (40 mg total) by mouth daily as needed. 90 tablet 1  . fexofenadine (ALLEGRA) 180 MG tablet TAKE 1 TABLET BY MOUTH EVERY DAY 90 tablet 3  . Fluticasone-Salmeterol (ADVAIR DISKUS) 250-50 MCG/DOSE AEPB Inhale 1 puff into the lungs 2 (two) times daily. 60 each 5  . Fluticasone-Umeclidin-Vilant (TRELEGY ELLIPTA) 200-62.5-25 MCG/INH AEPB Inhale 1 puff into the lungs daily. 14 each 0  . furosemide (LASIX) 40 MG tablet Take 1 tablet (40 mg total) by mouth daily. 90 tablet 3  . insulin lispro (INSULIN LISPRO) 100 UNIT/ML KwikPen Junior Inject 10-15 Units into the skin daily.    . insulin lispro protamine-insulin lispro (HUMALOG 75/25) (75-25) 100 UNIT/ML SUSP Inject 15-22 Units into the skin See admin instructions. Takes 16 units in the morning, 10 units at lunch, and 25  units with supper    . isosorbide mononitrate (IMDUR) 60 MG 24 hr tablet Take 1.5 tablets (90 mg total) by mouth daily. 135 tablet 3  . lisinopril (ZESTRIL) 20 MG tablet Take 1 tablet by mouth daily.    Marland Kitchen loperamide (IMODIUM) 2 MG capsule Take 2 mg by mouth as needed for diarrhea or loose stools.     . meclizine (ANTIVERT) 25 MG tablet Take 1 tablet (25 mg total) by mouth 2 (two) times daily as needed for dizziness. 30 tablet 2  . metoprolol succinate (TOPROL-XL) 100 MG 24 hr tablet TAKE 1 TABLET BY MOUTH EVERY DAY 90 tablet 3  . montelukast (SINGULAIR) 5 MG chewable tablet CHEW 1 TABLET (5 MG TOTAL) BY MOUTH AT BEDTIME. 90 tablet 1  . Multiple Vitamin (MULTIVITAMIN WITH MINERALS) TABS Take 1 tablet by mouth daily.    . Nebulizers (COMPRESSOR/NEBULIZER) MISC Use  with albuterol 1 each 0  . neomycin-polymyxin b-dexamethasone (MAXITROL) 3.5-10000-0.1 SUSP Place 1 drop into the right eye 4 (four) times daily.    . nitroGLYCERIN (NITROSTAT) 0.4 MG SL tablet Place 0.4 mg under the tongue every 5 (five) minutes as needed for chest pain.    . nortriptyline (PAMELOR) 50 MG capsule Take 2 capsules (100 mg total) by mouth at bedtime. 180 capsule 1  . NUCYNTA 50 MG tablet Take by mouth.    . ONE TOUCH ULTRA TEST test strip CHECK BLOOD SUGAR TWICE A DAY DX: E11.9  3  . ONETOUCH DELICA LANCETS  33G MISC 3 (three) times daily. for testing  0  . pantoprazole sodium (PROTONIX) 40 mg/20 mL PACK 1 packet    . RABEprazole (ACIPHEX) 20 MG tablet TAKE 1 TABLET (20 MG TOTAL) BY MOUTH DAILY. 90 tablet 1  . Spacer/Aero-Holding Chambers (AEROCHAMBER MV) inhaler Use as instructed 1 each 0  . Study - ORION 4 - inclisiran 300 mg/1.52m or placebo SQ injection (PI-Stuckey) Inject 300 mg into the skin every 6 (six) months.    . Tiotropium Bromide Monohydrate (SPIRIVA RESPIMAT) 2.5 MCG/ACT AERS Inhale 2 puffs into the lungs daily. 4 g 6  . chlorhexidine (PERIDEX) 0.12 % solution SMARTSIG:By Mouth    . estradiol (ESTRACE) 0.1 MG/GM vaginal cream Place vaginally.     Current Facility-Administered Medications on File Prior to Visit  Medication Dose Route Frequency Provider Last Rate Last Admin  . cyanocobalamin ((VITAMIN B-12)) injection 1,000 mcg  1,000 mcg Intramuscular Q30 days BBinnie Rail MD   1,000 mcg at 03/26/16 1658    Past Medical History:  Diagnosis Date  . Adenomatous colon polyp   . Allergy   . Anxiety   . Asthma   . Carotid artery disease ([place]    carotid [place]: bilat ICA 1-39%  . Chronic diastolic CHF    Echo 17/6283 EF 65-70, Gr 2 DD, mild MS (mean 5), PASP 44  . COPD    pt is unsure if has been officially diagnosed  . Coronary artery disease    CABG '09- cathed 12/09, 9/10, 6/11, 3/14 and 12/13/16- medical Rx // cath 12/2016 - 2/3 grafts patent >> med Rx  // Myoview 12/17: low risk  . Diabetes mellitus   . Gastroesophageal reflux disease   . Hiatal hernia   . 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 Hyperlipidemia   . Hypertension   . PONV (postoperative nausea and vomiting)   . Schatzki's ring   . Shoulder injury    resolved after shoulder surgery  . Sleep apnea    not on cpap    Past Surgical History:  Procedure Laterality Date  . ABDOMINAL HYSTERECTOMY    . APPENDECTOMY     came out with Hysterectomy  . CARDIAC CATHETERIZATION  07/23/2009   EF 60%  . CARDIAC CATHETERIZATION  10/11/2008  . CARDIAC CATHETERIZATION  03/01/2007   EF 75-80%  . CARDIOVASCULAR STRESS TEST  11/15/2007   EF 60%  . COLONOSCOPY    . CORONARY ARTERY BYPASS GRAFT     SEVERELY DISEASED SAPHENOUS VEIN GRAFT TO THE RIGHT CORONARY ARTERY BUT WITH FAIRLY WELL PRESERVED FLOW TO THE DISTAL RIGHT CORONARY ARTERY FROM THE NATIVE CIRCULATION-RESTART  CATH IN JUNE 2000, REVEALS MILD/MODERATE  CAD WITH GOOD FLOW DOWN HER LAD  . ESOPHAGOGASTRODUODENOSCOPY    . EYE SURGERY     bilateral cataract surgery with lens implant  . LEFT HEART CATH AND CORS/GRAFTS ANGIOGRAPHY N/A 12/14/2016   Procedure: LEFT HEART CATH AND CORS/GRAFTS ANGIOGRAPHY;  Surgeon: VJettie Booze MD;  [facility];  Service: Cardiovascular;  Laterality: N/A;  . lense removal Left   . POLYPECTOMY    . ROTATOR CUFF REPAIR     right and left  . TONSILLECTOMY     age 80 . TOTAL KNEE ARTHROPLASTY Right 02/20/2014   Procedure: RIGHT TOTAL KNEE ARTHROPLASTY;  Surgeon: RTobi Bastos MD;  Location: [facility];  Service: Orthopedics;  Laterality: Right;  . tumor removed kidney    . UPPER  GASTROINTESTINAL ENDOSCOPY    . US ECHOCARDIOGRAPHY  03/08/2008   EF 55-60%    Social History   Socioeconomic History  . Marital status: Widowed    Spouse name: Not on file  . Number of children: 4  . Years of education: Designer, jewellery  . Highest education level: Doctorate  Occupational History   . Occupation: Retired  Tobacco Use  . Smoking status: Former Smoker    Packs/day: 0.50    Years: 21.00    Pack years: 10.50    Types: Cigarettes    Start date: 02/09/1955    Quit date: 02/09/1976    Years since quitting: 44.2  . Smokeless tobacco: Never Used  Vaping Use  . Vaping Use: Never used  Substance and Sexual Activity  . Alcohol use: No    Alcohol/week: 0.0 standard drinks  . Drug use: No  . Sexual activity: Never  Other Topics Concern  . Not on file  Social History Narrative   Lives alone.  One story home.  Has 4 children.  Education: doctorate in theology.    Caffeine use: Drinks 1 cup coffee/day      Originally from [place]. Previously has lived in [place]. Prior travel to [place], [place], [place], [place], [place], [place], [place], & [place]. Previously worked in [employer] She has a dog currently. No bird, mold, or hot tub exposure. She also pastors a church.    Social Determinants of Health   Financial Resource Strain: Not on file  Food Insecurity: Not on file  Transportation Needs: Not on file  Physical Activity: Not on file  Stress: Not on file  Social Connections: Not on file    Family History  Problem Relation Age of Onset  . Heart disease Maternal Grandfather   . Heart failure Maternal Grandfather   . Diabetes Maternal Grandfather   . Heart attack Father   . Diabetes Mother   . Rheum arthritis Sister   . Emphysema Paternal Uncle   . Esophageal cancer Brother 27       she said he was born with it  . Emphysema Paternal Aunt   . Healthy Child   . Neuropathy Neg Hx   . Multiple sclerosis Neg Hx   . Colon cancer Neg Hx   . Colon polyps Neg Hx   . Rectal cancer Neg Hx   . Stomach cancer Neg Hx     Review of Systems  Constitutional: Negative for fever.  HENT: Negative for congestion and sinus pain.   Respiratory: Positive for wheezing. Negative for cough and shortness of breath.   Cardiovascular: Positive for chest pain (not cardiac). Negative for  palpitations.  Neurological: Positive for light-headedness and headaches. Negative for dizziness, weakness (nothing new) and numbness (nothing new).       Objective:   Vitals:   04/28/20 1359  BP: 130/70  Pulse: 80  Resp: 16  Temp: 98.3 F (36.8 C)   BP Readings from Last 3 Encounters:  04/28/20 130/70  02/14/20 137/73  02/11/20 126/76   Wt Readings from Last 3 Encounters:  04/28/20 195 lb (88.5 kg)  02/20/20 196 lb (88.9 kg)  02/11/20 196 lb 3.2 oz (89 kg)   Body mass index is 35.67 kg/m.   Physical Exam Constitutional:      General: She is not in acute distress.    Appearance: Normal appearance. She is not ill-appearing.  HENT:     Head: Normocephalic and atraumatic.     Right Ear: Tympanic membrane, ear  canal and external ear normal.     Left Ear: Tympanic membrane, ear canal and external ear normal.     Mouth/Throat:     Mouth: Mucous membranes are dry.     Pharynx: No posterior oropharyngeal erythema.  Eyes:     Extraocular Movements: Extraocular movements intact.     Conjunctiva/sclera: Conjunctivae normal.  Neck:     Vascular: No carotid bruit.  Cardiovascular:     Rate and Rhythm: Normal rate and regular rhythm.  Pulmonary:     Effort: Pulmonary effort is normal. No respiratory distress.     Breath sounds: No wheezing or rales.  Musculoskeletal:     Cervical back: Neck supple. No tenderness.     Right lower leg: No edema.     Left lower leg: No edema.  Lymphadenopathy:     Cervical: No cervical adenopathy.  Skin:    General: Skin is warm and dry.  Neurological:     General: No focal deficit present.     Mental Status: She is alert and oriented to person, place, and time.     Cranial Nerves: No cranial nerve deficit.     Sensory: No sensory deficit.     Motor: No weakness.     Gait: Gait abnormal.            Assessment & Plan:    See Problem List for Assessment and Plan of chronic medical problems.    This visit occurred during the  SARS-CoV-2 public health emergency.  Safety protocols were in place, including screening questions prior to the visit, additional usage of staff PPE, and extensive cleaning of exam room while observing appropriate contact time as indicated for disinfecting solutions.

## 2020-04-28 ENCOUNTER — Encounter: Payer: Self-pay | Admitting: Internal Medicine

## 2020-04-28 ENCOUNTER — Other Ambulatory Visit: Payer: Self-pay

## 2020-04-28 ENCOUNTER — Ambulatory Visit (INDEPENDENT_AMBULATORY_CARE_PROVIDER_SITE_OTHER): Payer: Medicare Other | Admitting: Internal Medicine

## 2020-04-28 VITALS — BP 130/70 | HR 80 | Temp 98.3°F | Resp 16 | Ht 62.0 in | Wt 195.0 lb

## 2020-04-28 DIAGNOSIS — R42 Dizziness and giddiness: Secondary | ICD-10-CM

## 2020-04-28 MED ORDER — MECLIZINE HCL 25 MG PO TABS: 25.0000 mg | ORAL_TABLET | Freq: Three times a day (TID) | ORAL | 2 refills | Status: DC | PRN

## 2020-04-28 MED ORDER — DIAZEPAM 5 MG PO TABS: ORAL_TABLET | ORAL | 0 refills | Status: DC

## 2020-04-28 NOTE — Patient Instructions (Addendum)
Medications changes include :   Valium 5 mg in morning.  You can take the meclizine three times a day.    Your prescription(s) have been submitted to your pharmacy. Please take as directed and contact our office if you believe you are having problem(s) with the medication(s).   A referral was ordered for PT       Someone from their office will call you to schedule an appointment.    Please call if there is no improvement in your symptoms.     Vertigo Vertigo is the feeling that you or your surroundings are moving when they are not. This feeling can come and go at any time. Vertigo often goes away on its own. Vertigo can be dangerous if it occurs while you are doing something that could endanger you or others, such as driving or operating machinery. Your health care provider will do tests to determine the cause of your vertigo. Tests will also help your health care provider decide how best to treat your condition. Follow these instructions at home: Eating and drinking  Drink enough fluid to keep your urine pale yellow.  Do not drink alcohol.      Activity  Return to your normal activities as told by your health care provider. Ask your health care provider what activities are safe for you.  In the morning, first sit up on the side of the bed. When you feel okay, stand slowly while you hold onto something until you know that your balance is fine.  Move slowly. Avoid sudden body or head movements or certain positions, as told by your health care provider.  If you have trouble walking or keeping your balance, try using a cane for stability. If you feel dizzy or unstable, sit down right away.  Avoid doing any tasks that would cause danger to you or others if vertigo occurs.  Avoid bending down if you feel dizzy. Place items in your home so that they are easy for you to reach without leaning over.  Do not drive or use heavy machinery if you feel dizzy. General instructions  Take  over-the-counter and prescription medicines only as told by your health care provider.  Keep all follow-up visits as told by your health care provider. This is important. Contact a health care provider if:  Your medicines do not relieve your vertigo or they make it worse.  You have a fever.  Your condition gets worse or you develop new symptoms.  Your family or friends notice any behavioral changes.  Your nausea or vomiting gets worse.  You have numbness or a prickling and tingling sensation in part of your body. Get help right away if you:  Have difficulty moving or speaking.  Are always dizzy.  Faint.  Develop severe headaches.  Have weakness in your hands, arms, or legs.  Have changes in your hearing or vision.  Develop a stiff neck.  Develop sensitivity to light. Summary  Vertigo is the feeling that you or your surroundings are moving when they are not.  Your health care provider will do tests to determine the cause of your vertigo.  Follow instructions for home care. You may be told to avoid certain tasks, positions, or movements.  Contact a health care provider if your medicines do not relieve your symptoms, or if you have a fever, nausea, vomiting, or changes in behavior.  Get help right away if you have severe headaches or difficulty speaking, or you develop hearing or vision  problems. This information is not intended to replace advice given to you by your health care provider. Make sure you discuss any questions you have with your health care provider. Document Revised: 12/19/2017 Document Reviewed: 12/19/2017 Elsevier Patient Education  2021 [employer].

## 2020-04-28 NOTE — Assessment & Plan Note (Addendum)
Chronic, intermittent Current episode is similar to previous episodes but worse Not obvious BPPV - not really related to head movements, but sitting still helps of course Has done PT in past, meclizine usually helps, but is not helping now Deferred MRI - this is ok since she has a long history of vertigo and has had a normal MRI in past Increase meclizine 25 mg to TID Trial of valium 5 mg in morning only - since taking clonazepam at night Referred to PT She will call if no improvement

## 2020-05-05 ENCOUNTER — Other Ambulatory Visit: Payer: Self-pay | Admitting: Pulmonary Disease

## 2020-05-07 ENCOUNTER — Other Ambulatory Visit: Payer: Self-pay | Admitting: Neurology

## 2020-05-14 ENCOUNTER — Other Ambulatory Visit: Payer: Self-pay

## 2020-05-14 ENCOUNTER — Ambulatory Visit (INDEPENDENT_AMBULATORY_CARE_PROVIDER_SITE_OTHER): Payer: Medicare Other | Admitting: Physician Assistant

## 2020-05-14 ENCOUNTER — Encounter: Payer: Self-pay | Admitting: Physician Assistant

## 2020-05-14 VITALS — BP 134/60 | HR 64 | Ht 62.0 in | Wt 195.0 lb

## 2020-05-14 DIAGNOSIS — J449 Chronic obstructive pulmonary disease, unspecified: Secondary | ICD-10-CM

## 2020-05-14 DIAGNOSIS — I10 Essential (primary) hypertension: Secondary | ICD-10-CM

## 2020-05-14 DIAGNOSIS — I25118 Atherosclerotic heart disease of native coronary artery with other forms of angina pectoris: Secondary | ICD-10-CM

## 2020-05-14 DIAGNOSIS — E782 Mixed hyperlipidemia: Secondary | ICD-10-CM

## 2020-05-14 DIAGNOSIS — I5032 Chronic diastolic (congestive) heart failure: Secondary | ICD-10-CM | POA: Diagnosis not present

## 2020-05-14 MED ORDER — ISOSORBIDE MONONITRATE ER 60 MG PO TB24: 60.0000 mg | ORAL_TABLET | Freq: Two times a day (BID) | ORAL | 3 refills | Status: DC

## 2020-05-14 MED ORDER — ISOSORBIDE MONONITRATE ER 60 MG PO TB24: ORAL_TABLET | ORAL | 3 refills | Status: AC

## 2020-05-14 NOTE — Progress Notes (Addendum)
Cardiology Office Note:    Date:  05/14/2020   ID:  [name], DOB [birth year], MRN [id number]  PCP:  Binnie Rail, MD   [place]  Cardiologist:  Mertie Moores, MD  Advanced Practice Provider:  Liliane Shi, PA-C Electrophysiologist:  None       Referring MD: Binnie Rail, MD   Chief Complaint:  Follow-up (CAD, recent stress test)    Patient Profile:     [name] is a 80 y.o. female with:   Coronary artery disease  ? S/p CABG ? 2/3 grafts patent by cath 12/2016 ? Myoview 01/2016: Low Risk  RBBB   Chronic Diastolic CHF ? Echocardiogram 02/2017: EF 65-70, Gr 2 DD  Diabetes mellitus   Hyperlipidemia   Hypertension   COPD   Prior CV studies: GATED SPECT MYO PERF W/LEXISCAN STRESS 1D 02/20/2020 Narrative  Nuclear stress EF: 59%.  The left ventricular ejection fraction is normal (55-65%).  There was no ST segment deviation noted during stress.  This is a low risk study. No ischemia or infarction on perfusion imaging.  Carotid US 02/18/17 Final Interpretation: Right Carotid: There is evidence in the right ICA of a 1-39% stenosis. Left Carotid: There is evidence in the left ICA of a 1-39% stenosis. Vertebrals: Both vertebral arteries were patent with antegrade flow. Subclavians: Normal flow hemodynamics were seen in bilateral subclavianarteries.  Echocardiogram 02/17/2017 EF 65-70, no RWMA, Gr 2 DD, severe MAC, mild MS (mean 5), mild LAE, trivial PI, PASP 44  Event Monitor 01/2017  NSR  No significant arrhythmias to explain syncope and dizzy symptoms  Cardiac catheterization 12/14/16 LAD prox 80; D1 ost 100 RCA prox 25, mid 25 S-PDA 100 L-LAD patent  S-D1 patent  EF 55-65    Nuclear stress test 01/12/16 EF 62, normal perfusion, low risk  Echocardiogram 09/18/15 EF 60-65, normal wall motion, abnormal relaxation, moderate to severe MAC, mild mitral stenosis (mean gradient 4), PASP 42  Carotid US  09/08/15 Bilateral ICA 1-39  Echocardiogram 01/07/15 EF 60-65, normal wall motion, grade 1 diastolic dysfunction, moderate to severe MAC, mild LAE, PASP 46, mildly reduced RV SF  Nuclear stress test 06/25/14 Normal perfusion, EF 55-65, low risk  Nuclear stress test 03/22/13 EF 68, no significant scar or ischemia, low risk  LHC 04/20/12: Proximal LAD 50%, mid LAD 50-60% circumflex normal proximal RCA 40-50% SVG-RCA occluded LIMA-LAD patent SVG-D1 patent.  Medical therapy was recommended  Echo 09/2011:  EF 60-55%, normal wall motion, grade 1 diastolic dysfunction, severe MAC.  Nuclear study 01/2011:  EF 72%, small fixed inferobasal defect suggestive of thinning, no ischemia.      History of Present Illness:    [name] was last seen by Dr. Acie Fredrickson in 02/2020.  She has a history of chest pain off and on for years.  Seem to be worsening.  He arranged for her to have a [employer].  This was low risk and negative for ischemia.    Medical therapy was continued.  She returns for follow-up.  She is here alone.  She continues to have episodes of chest discomfort.  She has had this since her bypass surgery.  Her heart catheterization 2018 was done to evaluate the symptoms.  Her recent stress test was low risk.  She sometimes notes discomfort around lunchtime.  It does not matter if she has eaten or not.  She has taken nitroglycerin in the past with relief.  She has chronic shortness of  breath related to COPD.  This is unchanged.  She has not had orthopnea, syncope.  She has mild right ankle edema which is chronic without change.     Past Medical History:  Diagnosis Date  . Adenomatous colon polyp   . Allergy   . Anxiety   . Asthma   . Carotid artery disease ([place])    carotid US 02/2017: bilat ICA 1-39%  . Chronic diastolic CHF    Echo 0/2774: EF 65-70, Gr 2 DD, mild MS (mean 5), PASP 44  . COPD    pt is unsure if has been officially diagnosed  . Coronary artery disease     CABG '09- cathed 12/09, 9/10, 6/11, 3/14 and 12/13/16- medical Rx // cath 12/2016 - 2/3 grafts patent >> med Rx // Myoview 12/17: low risk  . Diabetes mellitus   . Gastroesophageal reflux disease   . Hiatal hernia   . 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 Hyperlipidemia   . Hypertension   . PONV (postoperative nausea and vomiting)   . Schatzki's ring   . Shoulder injury    resolved after shoulder surgery  . Sleep apnea    not on cpap    Current Medications: Current Meds  Medication Sig  . albuterol (PROVENTIL HFA;VENTOLIN HFA) 108 (90 BASE) MCG/ACT inhaler Inhale 2 puffs into the lungs every 6 (six) hours as needed. For shortness of breath.  Marland Kitchen albuterol (PROVENTIL) (2.5 MG/3ML) 0.083% nebulizer solution USE 1 VIAL IN NEBULIZER 4 TIMES DAILY  . alum & mag hydroxide-simeth (MAALOX/MYLANTA) 200-200-20 MG/5ML suspension Take 30 mLs as needed by mouth for indigestion or heartburn.  Marland Kitchen aspirin EC 81 MG tablet Take 1 tablet (81 mg total) by mouth daily.  . baclofen (LIORESAL) 20 MG tablet Take 2 tablets (40 mg total) by mouth 2 (two) times daily. Due for f/u appt.  . Bempedoic Acid (NEXLETOL) 180 MG TABS Take 180 mg by mouth daily.  . benzonatate (TESSALON) 200 MG capsule Take 1 capsule (200 mg total) by mouth 3 (three) times daily as needed for cough.  . Blood Glucose Monitoring Suppl (ONE TOUCH ULTRA MINI) w/Device KIT 3 (three) times daily. for testing  . calcium carbonate (OS-CAL) 1250 (500 Ca) MG chewable tablet Chew 1 tablet by mouth as needed.  . calcium carbonate (TUMS - DOSED IN MG ELEMENTAL CALCIUM) 500 MG chewable tablet Chew 2 tablets daily as needed by mouth for indigestion or heartburn.  . chlorhexidine (PERIDEX) 0.12 % solution SMARTSIG:By Mouth  . Cholecalciferol (VITAMIN D) 2000 UNITS tablet Take 2,000 Units by mouth daily.  . clonazePAM (KLONOPIN) 0.5 MG tablet Take 0.5 mg daily by mouth.  . clopidogrel (PLAVIX) 75 MG tablet Take 75 mg by mouth daily.  . Continuous  Blood Gluc Sensor (FREESTYLE LIBRE 14 DAY SENSOR) MISC See admin instructions.  . Cyanocobalamin (VITAMIN B-12 IJ) Inject 1,000 mcg as directed every 30 (thirty) days.  . cyclobenzaprine (FLEXERIL) 5 MG tablet Take 1 tablet by mouth as needed.  . diazepam (VALIUM) 5 MG tablet Take 1 pill in morning only for vertigo.  Do not take with clonazepam (evenging)  . diclofenac sodium (VOLTAREN) 1 % GEL Apply 2 g topically 4 (four) times daily as needed (muscle pain).  Marland Kitchen diltiazem (CARDIZEM CD) 300 MG 24 hr capsule Take 1 capsule (300 mg total) by mouth daily.  Marland Kitchen estradiol (ESTRACE) 0.1 MG/GM vaginal cream Place vaginally.  . famotidine (PEPCID) 40 MG tablet Take 1 tablet (40  mg total) by mouth daily as needed.  . fexofenadine (ALLEGRA) 180 MG tablet TAKE 1 TABLET BY MOUTH EVERY DAY  . Fluticasone-Salmeterol (ADVAIR DISKUS) 250-50 MCG/DOSE AEPB Inhale 1 puff into the lungs 2 (two) times daily.  . Fluticasone-Umeclidin-Vilant (TRELEGY ELLIPTA) 200-62.5-25 MCG/INH AEPB Inhale 1 puff into the lungs daily.  . furosemide (LASIX) 40 MG tablet Take 1 tablet (40 mg total) by mouth daily.  . insulin lispro (INSULIN LISPRO) 100 UNIT/ML KwikPen Junior Inject 10-15 Units into the skin daily.  . insulin lispro protamine-insulin lispro (HUMALOG 75/25) (75-25) 100 UNIT/ML SUSP Inject 15-22 Units into the skin See admin instructions. Takes 16 units in the morning, 10 units at lunch, and 25  units with supper  . lisinopril (ZESTRIL) 20 MG tablet Take 1 tablet by mouth daily.  Marland Kitchen loperamide (IMODIUM) 2 MG capsule Take 2 mg by mouth as needed for diarrhea or loose stools.   . meclizine (ANTIVERT) 25 MG tablet Take 1 tablet (25 mg total) by mouth 3 (three) times daily as needed for dizziness.  . metoprolol succinate (TOPROL-XL) 100 MG 24 hr tablet TAKE 1 TABLET BY MOUTH EVERY DAY  . Multiple Vitamin (MULTIVITAMIN WITH MINERALS) TABS Take 1 tablet by mouth daily.  . Nebulizers (COMPRESSOR/NEBULIZER) MISC Use with albuterol  .  neomycin-polymyxin b-dexamethasone (MAXITROL) 3.5-10000-0.1 SUSP Place 1 drop into the right eye 4 (four) times daily.  . nitroGLYCERIN (NITROSTAT) 0.4 MG SL tablet Place 0.4 mg under the tongue every 5 (five) minutes as needed for chest pain.  . nortriptyline (PAMELOR) 50 MG capsule Take 2 capsules (100 mg total) by mouth at bedtime.  . NUCYNTA 50 MG tablet Take by mouth.  . ONE TOUCH ULTRA TEST test strip CHECK BLOOD SUGAR TWICE A DAY DX: E11.9  . ONETOUCH DELICA LANCETS 44I MISC 3 (three) times daily. for testing  . pantoprazole sodium (PROTONIX) 40 mg/20 mL PACK 1 packet  . RABEprazole (ACIPHEX) 20 MG tablet TAKE 1 TABLET (20 MG TOTAL) BY MOUTH DAILY.  Marland Kitchen Spacer/Aero-Holding Chambers (AEROCHAMBER MV) inhaler Use as instructed  . Study - ORION 4 - inclisiran 300 mg/1.73m or placebo SQ injection (PI-Stuckey) Inject 300 mg into the skin every 6 (six) months.  . Tiotropium Bromide Monohydrate (SPIRIVA RESPIMAT) 2.5 MCG/ACT AERS Inhale 2 puffs into the lungs daily.  . [DISCONTINUED] isosorbide mononitrate (IMDUR) 60 MG 24 hr tablet Take 1.5 tablets (90 mg total) by mouth daily.   Current Facility-Administered Medications for the 05/14/20 encounter (Office Visit) with WRichardson DoppT, PA-C  Medication  . cyanocobalamin ((VITAMIN B-12)) injection 1,000 mcg     Allergies:   Amlodipine, Banana, Co q10 [coenzyme q10], Codeine, Metformin and related, Morphine, Pentazocine, Pravastatin, Repatha [evolocumab], Statins, Sulfa antibiotics, Sulfonamide derivatives, and Tramadol hcl   Social History   Tobacco Use  . Smoking status: Former Smoker    Packs/day: 0.50    Years: 21.00    Pack years: 10.50    Types: Cigarettes    Start date: 02/09/1955    Quit date: 02/09/1976    Years since quitting: 44.2  . Smokeless tobacco: Never Used  Vaping Use  . Vaping Use: Never used  Substance Use Topics  . Alcohol use: No    Alcohol/week: 0.0 standard drinks  . Drug use: No     Family Hx: The patient's  family history includes Diabetes in her maternal grandfather and mother; Emphysema in her paternal aunt and paternal uncle; Esophageal cancer (age of onset: 379 in her brother; Healthy in her  child; Heart attack in her father; Heart disease in her maternal grandfather; Heart failure in her maternal grandfather; Rheum arthritis in her sister. There is no history of Neuropathy, Multiple sclerosis, Colon cancer, Colon polyps, Rectal cancer, or Stomach cancer.  ROS   EKGs/Labs/Other Test Reviewed:    EKG:  EKG is  not ordered today.  The ekg ordered today demonstrates n/a  Recent Labs: 12/19/2019: ALT 9; BUN 25; Creatinine, Ser 0.93; Hemoglobin 12.1; Platelets 285.0; Potassium 4.4; Sodium 140   Recent Lipid Panel Lab Results  Component Value Date/Time   CHOL 205 (H) 12/19/2019 02:37 PM   CHOL 119 12/22/2016 09:13 AM   TRIG 162.0 (H) 12/19/2019 02:37 PM   HDL 37.10 (L) 12/19/2019 02:37 PM   HDL 43 12/22/2016 09:13 AM   CHOLHDL 6 12/19/2019 02:37 PM   LDLCALC 135 (H) 12/19/2019 02:37 PM   LDLCALC 62 12/22/2016 09:13 AM      Risk Assessment/Calculations:      Physical Exam:    VS:  BP 134/60   Pulse 64   Ht 5' 2" (1.575 m)   Wt 195 lb (88.5 kg)   LMP  (LMP Unknown)   BMI 35.67 kg/m     Wt Readings from Last 3 Encounters:  05/14/20 195 lb (88.5 kg)  04/28/20 195 lb (88.5 kg)  02/20/20 196 lb (88.9 kg)     Constitutional:      Appearance: Healthy appearance. Not in distress.  Neck:     Vascular: No JVR.  Pulmonary:     Effort: Pulmonary effort is normal.     Breath sounds: No wheezing. No rales.  Cardiovascular:     Normal rate. Regular rhythm. Normal S1. Normal S2.     Murmurs: There is no murmur.  Edema:    Peripheral edema absent.  Abdominal:     Palpations: Abdomen is soft.  Skin:    General: Skin is warm and dry.  Neurological:     Mental Status: Alert and oriented to person, place and time.     Cranial Nerves: Cranial nerves are intact.       ASSESSMENT  & PLAN:    1. Coronary artery disease involving native coronary artery of native heart with other form of angina pectoris ([place]) History of CABG.  Cardiac catheterization 2018 demonstrated 2/3 grafts patent.  The vein graft to RPDA was occluded.  There was mild nonobstructive disease in the RCA.  She has had chest discomfort off and on since her bypass surgery.  She feels it is worse in the last few weeks.  She sometimes takes nitroglycerin with relief.  She recently underwent Lexiscan Myoview which demonstrated no ischemia and normal EF.  This was a low risk study.  She may have microvascular angina.  I have recommended adjusting her isosorbide to 60 mg twice a day.  Continue current dose of aspirin, bempedoic acid, clopidogrel, diltiazem, metoprolol succinate.  Follow-up 3 months.  2. Chronic heart failure with preserved ejection fraction (HCC) Echocardiogram in 1/19 demonstrated normal LV function.  She is mainly limited by COPD.  Volume status is currently stable.  Continue current dose of furosemide.  3. Essential hypertension The patient's blood pressure is controlled on her current regimen.  Continue current therapy.   4. Mixed hyperlipidemia LDL above goal by labs obtained in 11/21.  LDL at that time was 135.  She is intolerant to statins.  She is also intolerant to PCSK9 inhibitors.  She remains on bempedoic acid.  Arrange follow-up lipids  in the next few weeks.  If her LDL remains above 70, add ezetimibe.  5. Chronic obstructive pulmonary disease, unspecified COPD type ([place]) Continue follow-up with pulmonology as planned   Dispo:  Return in about 3 months (around 08/13/2020) for Routine Follow Up, w/ Dr. Acie Fredrickson, or Richardson Dopp, PA-C, in person.   Medication Adjustments/Labs and Tests Ordered: Current medicines are reviewed at length with the patient today.  Concerns regarding medicines are outlined above.  Tests Ordered: Orders Placed This Encounter  Procedures  . Lipid Profile    Medication Changes: Meds ordered this encounter  Medications  . DISCONTD: isosorbide mononitrate (IMDUR) 60 MG 24 hr tablet    Sig: Take 1 tablet (60 mg total) by mouth in the morning and at bedtime.    Dispense:  180 tablet    Refill:  3  . isosorbide mononitrate (IMDUR) 60 MG 24 hr tablet    Sig: Take one tablet ( 60 mg ) twice daily 8 hours apart.    Dispense:  180 tablet    Refill:  3    Signed, Richardson Dopp, PA-C  05/14/2020 5:41 PM    [facility] [address], [place], [place]  [address] Phone: [phone]; Fax: [phone]

## 2020-05-14 NOTE — Patient Instructions (Signed)
Medication Instructions:  Your physician has recommended you make the following change in your medication:   1. Change imdur one tablet by mouth ( 60 mg) twice daily, take 8 hours apart. Sent in # 90 to requested pharmacy.   *If you need a refill on your cardiac medications before your next appointment, please call your pharmacy*   Lab Work: Your physician recommends that you return for a FASTING lipid profile on Wednesday, May 18 between 7:30 - 4:30 fasting from midnight the night before.   If you have labs (blood work) drawn today and your tests are completely normal, you will receive your results only by: Marland Kitchen MyChart Message (if you have MyChart) OR . A paper copy in the mail If you have any lab test that is abnormal or we need to change your treatment, we will call you to review the results.   Testing/Procedures: -None   Follow-Up: At [facility], you and your health needs are our priority.  As part of our continuing mission to provide you with exceptional heart care, we have created designated Provider Care Teams.  These Care Teams include your primary Cardiologist (physician) and Advanced Practice Providers (APPs -  Physician Assistants and Nurse Practitioners) who all work together to provide you with the care you need, when you need it.  We recommend signing up for the patient portal called "MyChart".  Sign up information is provided on this After Visit Summary.  MyChart is used to connect with patients for Virtual Visits (Telemedicine).  Patients are able to view lab/test results, encounter notes, upcoming appointments, etc.  Non-urgent messages can be sent to your provider as well.   To learn more about what you can do with MyChart, go to [email or web address].    Your next appointment:   3 month(s), Wednesday, July 6 @ 1:45 pm.   The format for your next appointment:   In Person  Provider:   Richardson Dopp, PA-C   Other Instructions -None

## 2020-05-31 ENCOUNTER — Other Ambulatory Visit: Payer: Self-pay | Admitting: Internal Medicine

## 2020-05-31 ENCOUNTER — Other Ambulatory Visit: Payer: Self-pay | Admitting: Cardiovascular Disease

## 2020-05-31 ENCOUNTER — Other Ambulatory Visit: Payer: Self-pay | Admitting: Neurology

## 2020-06-02 ENCOUNTER — Other Ambulatory Visit: Payer: Self-pay

## 2020-06-23 ENCOUNTER — Telehealth: Payer: Self-pay

## 2020-06-23 DIAGNOSIS — Z006 Encounter for examination for normal comparison and control in clinical research program: Secondary | ICD-10-CM

## 2020-06-23 NOTE — Telephone Encounter (Signed)
Called pt to remind her of appointment on tomorrow 06/24/2020 at 11AM, I also provided her the gate code as well as our department call back number if she needs further assistance.

## 2020-06-24 ENCOUNTER — Encounter: Payer: Medicare Other | Admitting: *Deleted

## 2020-06-24 ENCOUNTER — Other Ambulatory Visit: Payer: Self-pay

## 2020-06-24 DIAGNOSIS — Z006 Encounter for examination for normal comparison and control in clinical research program: Secondary | ICD-10-CM

## 2020-06-24 NOTE — Research (Signed)
Subject came into the research clinic today for their Visit 7 for the [facility] 4 Research Trial. Subject signed the addendum to the new informed consent before any procedures or assessments were completed.  Subject Name: [name]  Subject met inclusion and exclusion criteria.  The informed consent form, study requirements and expectations were reviewed with the subject and questions and concerns were addressed prior to the signing of the consent form.  The subject verbalized understanding of the trial requirements.  The subject agreed to participate in the [place] 4 trial and signed the informed consent at 1100 on 06/24/20  The informed consent was obtained prior to performance of any protocol-specific procedures for the subject.  A copy of the signed informed consent was given to the subject and a copy was placed in the subject's medical record.   Preston Fleeting C    Subject's concomitant medications have been reviewed and updated if applicable. There are no new AE's or SAE's to report to sponsor at this time. Subject was given their IP injection subcutaneously into the right lower abdomen and tolerated well. Patient did have labs today that will be sent off. Next appointment scheduled for Tuesday, November 15th, 2022 @ 11am.

## 2020-06-25 ENCOUNTER — Other Ambulatory Visit: Payer: Medicare Other | Admitting: *Deleted

## 2020-06-25 DIAGNOSIS — J449 Chronic obstructive pulmonary disease, unspecified: Secondary | ICD-10-CM

## 2020-06-25 DIAGNOSIS — I25118 Atherosclerotic heart disease of native coronary artery with other forms of angina pectoris: Secondary | ICD-10-CM

## 2020-06-25 DIAGNOSIS — I5032 Chronic diastolic (congestive) heart failure: Secondary | ICD-10-CM

## 2020-06-25 DIAGNOSIS — E782 Mixed hyperlipidemia: Secondary | ICD-10-CM

## 2020-06-25 DIAGNOSIS — I10 Essential (primary) hypertension: Secondary | ICD-10-CM

## 2020-06-25 LAB — LIPID PANEL
Chol/HDL Ratio: 4.6 ratio — ABNORMAL HIGH (ref 0.0–4.4)
Cholesterol, Total: 139 mg/dL (ref 100–199)
HDL: 30 mg/dL — ABNORMAL LOW (ref >39)
LDL Chol Calc (NIH): 85 mg/dL (ref 0–99)
Triglycerides: 132 mg/dL (ref 0–149)
VLDL Cholesterol Cal: 24 mg/dL (ref 5–40)

## 2020-06-26 MED ORDER — EZETIMIBE 10 MG PO TABS: 10.0000 mg | ORAL_TABLET | Freq: Every day | ORAL | 3 refills | Status: DC

## 2020-06-26 NOTE — Addendum Note (Signed)
Addended by: Willeen Cass on: 06/26/2020 09:42 AM   Modules accepted: Orders

## 2020-06-26 NOTE — Progress Notes (Signed)
Spoke with patient about lab results and recommendation to start Zetia 10mg  daily by mouth to help lower LDL's.  Patient is receptive to this and prescription was sent in to her pharmacy.  Lab appointment set for September 13 for lipid panel and liver.

## 2020-06-26 NOTE — Addendum Note (Signed)
Addended by: Willeen Cass on: 06/26/2020 09:37 AM   Modules accepted: Orders

## 2020-07-01 ENCOUNTER — Other Ambulatory Visit: Payer: Self-pay | Admitting: Internal Medicine

## 2020-07-28 ENCOUNTER — Other Ambulatory Visit: Payer: Self-pay | Admitting: Internal Medicine

## 2020-08-07 ENCOUNTER — Telehealth: Payer: Self-pay | Admitting: Internal Medicine

## 2020-08-07 NOTE — Chronic Care Management (AMB) (Signed)
  Chronic Care Management   Note  08/07/2020 Name: [name] MRN: [id number] DOB: 12/13/1940  [name] is a 80 y.o. year old female who is a primary care patient of Burns, Claudina Lick, MD. I reached out to [name] by phone today in response to a referral sent by [name]'s PCP, Binnie Rail, MD.   [name] was given information about Chronic Care Management services today including:  CCM service includes personalized support from designated clinical staff supervised by her physician, including individualized plan of care and coordination with other care providers 24/7 contact phone numbers for assistance for urgent and routine care needs. Service will only be billed when office clinical staff spend 20 minutes or more in a month to coordinate care. Only one practitioner may furnish and bill the service in a calendar month. The patient may stop CCM services at any time (effective at the end of the month) by phone call to the office staff.   Patient agreed to services and verbal consent obtained.   Follow up plan:   [name] Upstream Scheduler

## 2020-08-13 ENCOUNTER — Encounter: Payer: Self-pay | Admitting: Physician Assistant

## 2020-08-13 ENCOUNTER — Ambulatory Visit (INDEPENDENT_AMBULATORY_CARE_PROVIDER_SITE_OTHER): Payer: Medicare Other | Admitting: Physician Assistant

## 2020-08-13 ENCOUNTER — Other Ambulatory Visit: Payer: Self-pay

## 2020-08-13 VITALS — BP 140/62 | HR 68 | Ht 62.0 in | Wt 194.0 lb

## 2020-08-13 DIAGNOSIS — I25118 Atherosclerotic heart disease of native coronary artery with other forms of angina pectoris: Secondary | ICD-10-CM

## 2020-08-13 DIAGNOSIS — I5032 Chronic diastolic (congestive) heart failure: Secondary | ICD-10-CM

## 2020-08-13 DIAGNOSIS — I10 Essential (primary) hypertension: Secondary | ICD-10-CM | POA: Diagnosis not present

## 2020-08-13 DIAGNOSIS — E782 Mixed hyperlipidemia: Secondary | ICD-10-CM

## 2020-08-13 MED ORDER — SPIRONOLACTONE 25 MG PO TABS: 12.5000 mg | ORAL_TABLET | Freq: Every day | ORAL | 11 refills | Status: DC

## 2020-08-13 NOTE — Progress Notes (Signed)
Cardiology Office Note:    Date:  08/13/2020   ID:  [name], DOB 1940/03/01, MRN [id number]  PCP:  Binnie Rail, MD   [facility] Providers Cardiologist:  Mertie Moores, MD Cardiology APP:  [facility] [place]      Referring MD: Binnie Rail, MD   Chief Complaint:  Follow-up (CAD)    Patient Profile:    GEMMA RUAN is a 80 y.o. female with:  Coronary artery disease S/p CABG Myoview 01/2016: Low Risk Cath 11/18: 2/3 grafts patent Myoview 1/22: low risk  RBBB Chronic Diastolic CHF Echocardiogram 02/2017: EF 68-70, Gr 2 DD Diabetes mellitus Hyperlipidemia Hypertension COPD Echocardiogram 1/19: PASP 44 Mital stenosis (mild by Echocardiogram 1/19)     Prior CV studies: GATED SPECT MYO PERF W/LEXISCAN STRESS 1D 02/20/2020 EF 59, no ischemia or infarction; low risk    Carotid US 02/18/17 Bilateral ICA 1-39    Echocardiogram 02/17/2017 EF 65-70, no RWMA, Gr 2 DD, severe MAC, mild MS (mean 5), mild LAE, trivial PI, PASP 44   Event Monitor 01/2017 NSR No significant arrhythmias to explain syncope and dizzy symptoms   Cardiac catheterization 12/14/16 LAD prox 80; D1 ost 100 RCA prox 25, mid 25 S-PDA 100 L-LAD patent S-D1 patent EF 55-65  Nuclear stress test 01/12/16 EF 62, normal perfusion, low risk   Echocardiogram 09/18/15 EF 60-65, normal wall motion, abnormal relaxation, moderate to severe MAC, mild mitral stenosis (mean gradient 4), PASP 42   Carotid US 09/08/15 Bilateral ICA 1-39   Echocardiogram 01/07/15 EF 60-65, normal wall motion, grade 1 diastolic dysfunction, moderate to severe MAC, mild LAE, PASP 46, mildly reduced RV SF   Nuclear stress test 06/25/14 Normal perfusion, EF 55-65, low risk   Nuclear stress test 03/22/13 EF 68, no significant scar or ischemia, low risk   LHC 04/20/12:  Proximal LAD 50%, mid LAD 50-60% circumflex normal proximal RCA 40-50% SVG-RCA occluded LIMA-LAD patent SVG-D1 patent. Medical therapy was  recommended   Echo 09/2011: EF 60-55%, normal wall motion, grade 1 diastolic dysfunction, severe MAC.    Nuclear study 01/2011: EF 72%, small fixed inferobasal defect suggestive of thinning, no ischemia.     History of Present Illness: Ms. Shams was last seen in 4/22.  She was still having symptoms of chest pain.  This was chronic since her CABG but was somewhat worse.  She had a recent Myoview that was low risk.  I adjusted her Isosorbide.  She returns for f/u.  She is here alone.  She has a lot of issues with vertigo. She continues to have occasional chest pain.  She had some improvement with increasing her Isosorbide.  She had an episode of chest pain a couple of days ago that was worse. She took NTG with relief. She has shortness of breath with exertion that is stable.  She uses her inhaler for asthma with relief. She has not had syncope.  She sleeps on 2 pillows.  She has not had significant leg edema.          Past Medical History:  Diagnosis Date   Adenomatous colon polyp    Allergy    Anxiety    Asthma    Carotid artery disease ([place])    carotid US 02/2017: bilat ICA 1-39%   Chronic diastolic CHF    Echo 03/6376: EF 65-70, Gr 2 DD, mild MS (mean 5), PASP 44   COPD    pt is unsure if has been officially diagnosed  Coronary artery disease    CABG '09- cathed 12/09, 9/10, 6/11, 3/14 and 12/13/16- medical Rx // cath 12/2016 - 2/3 grafts patent >> med Rx // Myoview 12/17: low risk   Diabetes mellitus    Gastroesophageal reflux disease    Hiatal hernia    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   Hyperlipidemia    Hypertension    PONV (postoperative nausea and vomiting)    Schatzki's ring    Shoulder injury    resolved after shoulder surgery   Sleep apnea    not on cpap    Current Medications: Current Meds  Medication Sig   albuterol (PROVENTIL HFA;VENTOLIN HFA) 108 (90 BASE) MCG/ACT inhaler Inhale 2 puffs into the lungs every 6 (six) hours as needed. For shortness of  breath.   albuterol (PROVENTIL) (2.5 MG/3ML) 0.083% nebulizer solution USE 1 VIAL IN NEBULIZER 4 TIMES DAILY   alum & mag hydroxide-simeth (MAALOX/MYLANTA) 200-200-20 MG/5ML suspension Take 30 mLs as needed by mouth for indigestion or heartburn.   aspirin EC 81 MG tablet Take 1 tablet (81 mg total) by mouth daily.   baclofen (LIORESAL) 20 MG tablet Take 2 tablets (40 mg total) by mouth 2 (two) times daily. Due for f/u appt.   Bempedoic Acid (NEXLETOL) 180 MG TABS Take 180 mg by mouth daily.   benzonatate (TESSALON) 200 MG capsule Take 1 capsule (200 mg total) by mouth 3 (three) times daily as needed for cough.   Blood Glucose Monitoring Suppl (ONE TOUCH ULTRA MINI) w/Device KIT 3 (three) times daily. for testing   calcium carbonate (OS-CAL) 1250 (500 Ca) MG chewable tablet Chew 1 tablet by mouth as needed.   calcium carbonate (TUMS - DOSED IN MG ELEMENTAL CALCIUM) 500 MG chewable tablet Chew 2 tablets daily as needed by mouth for indigestion or heartburn.   chlorhexidine (PERIDEX) 0.12 % solution SMARTSIG:By Mouth   Cholecalciferol (VITAMIN D) 2000 UNITS tablet Take 2,000 Units by mouth daily.   clonazePAM (KLONOPIN) 0.5 MG tablet Take 0.5 mg daily by mouth.   clopidogrel (PLAVIX) 75 MG tablet Take 75 mg by mouth daily.   Continuous Blood Gluc Sensor (FREESTYLE LIBRE 14 DAY SENSOR) MISC See admin instructions.   Cyanocobalamin (VITAMIN B-12 IJ) Inject 1,000 mcg as directed every 30 (thirty) days.   cyclobenzaprine (FLEXERIL) 5 MG tablet Take 1 tablet by mouth as needed.   diclofenac sodium (VOLTAREN) 1 % GEL Apply 2 g topically 4 (four) times daily as needed (muscle pain).   diltiazem (CARDIZEM CD) 240 MG 24 hr capsule TAKE ONE CAPSULE BY MOUTH EVERY DAY   diltiazem (CARDIZEM CD) 300 MG 24 hr capsule Take 1 capsule (300 mg total) by mouth daily.   estradiol (ESTRACE) 0.1 MG/GM vaginal cream Place vaginally.   ezetimibe (ZETIA) 10 MG tablet Take 1 tablet (10 mg total) by mouth daily.    famotidine (PEPCID) 40 MG tablet Take 1 tablet (40 mg total) by mouth daily as needed.   fexofenadine (ALLEGRA) 180 MG tablet TAKE 1 TABLET BY MOUTH EVERY DAY   Fluticasone-Salmeterol (ADVAIR DISKUS) 250-50 MCG/DOSE AEPB Inhale 1 puff into the lungs 2 (two) times daily.   Fluticasone-Umeclidin-Vilant (TRELEGY ELLIPTA) 200-62.5-25 MCG/INH AEPB Inhale 1 puff into the lungs daily.   furosemide (LASIX) 40 MG tablet Take 1 tablet (40 mg total) by mouth daily.   insulin lispro (INSULIN LISPRO) 100 UNIT/ML KwikPen Junior Inject 10-15 Units into the skin daily.   insulin lispro protamine-insulin lispro (HUMALOG 75/25) (75-25)  100 UNIT/ML SUSP Inject 15-22 Units into the skin See admin instructions. Takes 16 units in the morning, 10 units at lunch, and 25  units with supper   isosorbide mononitrate (IMDUR) 60 MG 24 hr tablet Take one tablet ( 60 mg ) twice daily 8 hours apart.   lisinopril (ZESTRIL) 20 MG tablet Take 1 tablet by mouth daily.   loperamide (IMODIUM) 2 MG capsule Take 2 mg by mouth as needed for diarrhea or loose stools.    meclizine (ANTIVERT) 25 MG tablet TAKE ONE TABLET BY MOUTH TWICE DAILY AS NEEDED FOR DIZZINESS   metoprolol succinate (TOPROL-XL) 100 MG 24 hr tablet TAKE ONE TABLET BY MOUTH EVERY DAY   Multiple Vitamin (MULTIVITAMIN WITH MINERALS) TABS Take 1 tablet by mouth daily.   Nebulizers (COMPRESSOR/NEBULIZER) MISC Use with albuterol   neomycin-polymyxin b-dexamethasone (MAXITROL) 3.5-10000-0.1 SUSP Place 1 drop into the right eye 4 (four) times daily.   nitroGLYCERIN (NITROSTAT) 0.4 MG SL tablet Place 0.4 mg under the tongue every 5 (five) minutes as needed for chest pain.   nortriptyline (PAMELOR) 50 MG capsule TAKE 2 CAPSULES (100 MG TOTAL) BY MOUTH AT BEDTIME.   ONE TOUCH ULTRA TEST test strip CHECK BLOOD SUGAR TWICE A DAY DX: K46.2   ONETOUCH DELICA LANCETS 86N MISC 3 (three) times daily. for testing   pantoprazole sodium (PROTONIX) 40 mg/20 mL PACK 1 packet   RABEprazole  (ACIPHEX) 20 MG tablet TAKE ONE TABLET BY MOUTH EVERY DAY   Spacer/Aero-Holding Chambers (AEROCHAMBER MV) inhaler Use as instructed   spironolactone (ALDACTONE) 25 MG tablet Take 0.5 tablets (12.5 mg total) by mouth daily.   Study - ORION 4 - inclisiran 300 mg/1.24m or placebo SQ injection (PI-Stuckey) Inject 300 mg into the skin every 6 (six) months.   Tiotropium Bromide Monohydrate (SPIRIVA RESPIMAT) 2.5 MCG/ACT AERS Inhale 2 puffs into the lungs daily.   Current Facility-Administered Medications for the 08/13/20 encounter (Office Visit) with WRichardson DoppT, PA-C  Medication   cyanocobalamin ((VITAMIN B-12)) injection 1,000 mcg     Allergies:   Amlodipine, Banana, Co q10 [coenzyme q10], Codeine, Metformin and related, Morphine, Pentazocine, Pravastatin, Repatha [evolocumab], Statins, Sulfa antibiotics, Sulfonamide derivatives, and Tramadol hcl   Social History   Tobacco Use   Smoking status: Former    Packs/day: 0.50    Years: 21.00    Pack years: 10.50    Types: Cigarettes    Start date: 02/09/1955    Quit date: 02/09/1976    Years since quitting: 44.5   Smokeless tobacco: Never  Vaping Use   Vaping Use: Never used  Substance Use Topics   Alcohol use: No    Alcohol/week: 0.0 standard drinks   Drug use: No     Family Hx: The patient's family history includes Diabetes in her maternal grandfather and mother; Emphysema in her paternal aunt and paternal uncle; Esophageal cancer (age of onset: 337 in her brother; Healthy in her child; Heart attack in her father; Heart disease in her maternal grandfather; Heart failure in her maternal grandfather; Rheum arthritis in her sister. There is no history of Neuropathy, Multiple sclerosis, Colon cancer, Colon polyps, Rectal cancer, or Stomach cancer.  ROS   EKGs/Labs/Other Test Reviewed:    EKG:  EKG is not ordered today.  The ekg ordered today demonstrates n/a  Recent Labs: 12/19/2019: ALT 9; BUN 25; Creatinine, Ser 0.93; Hemoglobin 12.1;  Platelets 285.0; Potassium 4.4; Sodium 140   Recent Lipid Panel Lab Results  Component Value Date/Time  CHOL 139 06/25/2020 10:45 AM   TRIG 132 06/25/2020 10:45 AM   HDL 30 (L) 06/25/2020 10:45 AM   LDLCALC 85 06/25/2020 10:45 AM      Risk Assessment/Calculations:      Physical Exam:    VS:  BP 140/62   Pulse 68   Ht _0  (1.575 m)   Wt 194 lb (88 kg)   LMP  (LMP Unknown)   BMI 35.48 kg/m     Wt Readings from Last 3 Encounters:  08/13/20 194 lb (88 kg)  05/14/20 195 lb (88.5 kg)  04/28/20 195 lb (88.5 kg)     Constitutional:      Appearance: Healthy appearance. Not in distress.  Neck:     Vascular: JVD normal.  Pulmonary:     Effort: Pulmonary effort is normal.     Breath sounds: No wheezing. No rales.  Cardiovascular:     Normal rate. Regular rhythm. Normal S1. Normal S2.      Murmurs: There is no murmur.  Edema:    Peripheral edema absent.  Abdominal:     Palpations: Abdomen is soft.  Skin:    General: Skin is warm and dry.  Neurological:     Mental Status: Alert and oriented to person, place and time.     Cranial Nerves: Cranial nerves are intact.         ASSESSMENT & PLAN:    1. Coronary artery disease involving native coronary artery of native heart with other form of angina pectoris ([place]) History of CABG.  Cardiac catheterization 2018 demonstrated 2/3 grafts patent.  The vein graft to RPDA was occluded.  There was mild nonobstructive disease in the RCA.  She has had chest discomfort off and on since her bypass surgery. Myoview in 1/22 was low risk.  She had a worse episode a few days ago but no other chest pain since.  Her BP is uncontrolled.  I wonder if her chest pain is related to uncontrolled BP in the setting of (HFpEF) heart failure with preserved ejection fraction.  I will adjust her Rx for CHF as noted below.  Continue ASA, Isosorbide, metoprolol succinate, clopidogrel.  F/u with Dr. Acie Fredrickson or me in 6-8 weeks.  If she has escalating symptoms of  chest pain, we may need to consider cardiac catheterization.   2. Chronic heart failure with preserved ejection fraction (HCC) NYHA IIb-III.  Volume status appears stable.  She has uncontrolled BP. I will add Spironolactone 12.5 mg once daily.  Continue current dose of Furosemide.  F/u with HTN clinic in 2 weeks.  Increase Spironolactone at that time if renal function and K+ will tolerate.  If she still needs adjustments for BP control, I would then switch her ACE to [facility] instead of starting SGLT2i.  F/u BMET once weekly x 2.  3. Essential hypertension Her BP at home ranges 846N-629B sytolic.  Add Spironolactone 12.5 mg once daily as noted.  Continue current dose of isosorbide, lisinopril, metoprolol succinate, diltiazem.  4. Mixed hyperlipidemia She is intolerant to statins.  She is enrolled in the [place] 4 trial.  She is also on bempedoic acid, ezetimibe.     Dispo:  Return in about 8 weeks (around 10/08/2020) for Routine follow up in 8 weeks with Dr.Nasher. .   Medication Adjustments/Labs and Tests Ordered: Current medicines are reviewed at length with the patient today.  Concerns regarding medicines are outlined above.  Tests Ordered: Orders Placed This Encounter  Procedures   Basic Metabolic  Panel (BMET)   Basic Metabolic Panel (BMET)   AMB Referral to [facility] Pharm-D   Medication Changes: Meds ordered this encounter  Medications   spironolactone (ALDACTONE) 25 MG tablet    Sig: Take 0.5 tablets (12.5 mg total) by mouth daily.    Dispense:  15 tablet    Refill:  [address], Richardson Dopp, [place]  08/13/2020 4:55 PM    [facility] [place], [place], [place]  [address] Phone: [phone]; Fax: [phone]

## 2020-08-13 NOTE — Patient Instructions (Signed)
Medication Instructions:    START SPIRONOLACTONE one half tablet by mouth ( 12.5 mg) daily.   *If you need a refill on your cardiac medications before your next appointment, please call your pharmacy*   Lab Work:  Your physician recommends that you return for lab work on Wednesday, July 13 between 7:30 - 4:30.   Your physician recommends that you return for lab work in: Thursday, July 21 between 7:30 - 4:30.  If you have labs (blood work) drawn today and your tests are completely normal, you will receive your results only by: [place] (if you have MyChart) OR A paper copy in the mail If you have any lab test that is abnormal or we need to change your treatment, we will call you to review the results.   Testing/Procedures:  -NONE   Follow-Up: At [facility], you and your health needs are our priority.  As part of our continuing mission to provide you with exceptional heart care, we have created designated Provider Care Teams.  These Care Teams include your primary Cardiologist (physician) and Advanced Practice Providers (APPs -  Physician Assistants and Nurse Practitioners) who all work together to provide you with the care you need, when you need it.  We recommend signing up for the patient portal called "MyChart".  Sign up information is provided on this After Visit Summary.  MyChart is used to connect with patients for Virtual Visits (Telemedicine).  Patients are able to view lab/test results, encounter notes, upcoming appointments, etc.  Non-urgent messages can be sent to your provider as well.   To learn more about what you can do with MyChart, go to [email or web address].    Your next appointment:    8 week(s) with Dr. Acie Fredrickson on Tuesday, October 4 @ 2;00 pm.   The format for your next appointment:   In Person  Provider:   Mertie Moores, MD   Other Instructions  You have been referred to the Hypertension Clinic on Thursday, July 21 @ 2;00 pm.

## 2020-08-20 ENCOUNTER — Other Ambulatory Visit: Payer: Medicare Other | Admitting: *Deleted

## 2020-08-20 ENCOUNTER — Other Ambulatory Visit: Payer: Self-pay

## 2020-08-20 DIAGNOSIS — I5032 Chronic diastolic (congestive) heart failure: Secondary | ICD-10-CM

## 2020-08-20 DIAGNOSIS — E782 Mixed hyperlipidemia: Secondary | ICD-10-CM

## 2020-08-20 DIAGNOSIS — I25118 Atherosclerotic heart disease of native coronary artery with other forms of angina pectoris: Secondary | ICD-10-CM

## 2020-08-20 DIAGNOSIS — I10 Essential (primary) hypertension: Secondary | ICD-10-CM

## 2020-08-21 LAB — BASIC METABOLIC PANEL
BUN/Creatinine Ratio: 23 (ref 12–28)
BUN: 21 mg/dL (ref 8–27)
CO2: 25 mmol/L (ref 20–29)
Calcium: 9.3 mg/dL (ref 8.7–10.3)
Chloride: 99 mmol/L (ref 96–106)
Creatinine, Ser: 0.91 mg/dL (ref 0.57–1.00)
Glucose: 147 mg/dL — ABNORMAL HIGH (ref 65–99)
Potassium: 4.8 mmol/L (ref 3.5–5.2)
Sodium: 141 mmol/L (ref 134–144)
eGFR: 63 mL/min/{1.73_m2} (ref >59)

## 2020-08-24 ENCOUNTER — Emergency Department (HOSPITAL_COMMUNITY)
Admission: EM | Admit: 2020-08-24 | Discharge: 2020-08-25 | Disposition: A | Discharge status: Home / Self Care | Payer: Medicare Other | Attending: Emergency Medicine | Admitting: Emergency Medicine

## 2020-08-24 ENCOUNTER — Emergency Department (HOSPITAL_COMMUNITY): Payer: Medicare Other

## 2020-08-24 ENCOUNTER — Other Ambulatory Visit: Payer: Self-pay

## 2020-08-24 ENCOUNTER — Encounter (HOSPITAL_COMMUNITY): Payer: Self-pay | Admitting: Emergency Medicine

## 2020-08-24 DIAGNOSIS — Z7902 Long term (current) use of antithrombotics/antiplatelets: Secondary | ICD-10-CM | POA: Diagnosis not present

## 2020-08-24 DIAGNOSIS — I110 Hypertensive heart disease with heart failure: Secondary | ICD-10-CM | POA: Insufficient documentation

## 2020-08-24 DIAGNOSIS — Z79899 Other long term (current) drug therapy: Secondary | ICD-10-CM | POA: Insufficient documentation

## 2020-08-24 DIAGNOSIS — E785 Hyperlipidemia, unspecified: Secondary | ICD-10-CM | POA: Diagnosis not present

## 2020-08-24 DIAGNOSIS — R42 Dizziness and giddiness: Secondary | ICD-10-CM

## 2020-08-24 DIAGNOSIS — E1069 Type 1 diabetes mellitus with other specified complication: Secondary | ICD-10-CM | POA: Diagnosis not present

## 2020-08-24 DIAGNOSIS — Z951 Presence of aortocoronary bypass graft: Secondary | ICD-10-CM | POA: Diagnosis not present

## 2020-08-24 DIAGNOSIS — J449 Chronic obstructive pulmonary disease, unspecified: Secondary | ICD-10-CM | POA: Insufficient documentation

## 2020-08-24 DIAGNOSIS — Z96651 Presence of right artificial knee joint: Secondary | ICD-10-CM | POA: Diagnosis not present

## 2020-08-24 DIAGNOSIS — R000 Tachycardia, unspecified: Secondary | ICD-10-CM | POA: Diagnosis not present

## 2020-08-24 DIAGNOSIS — I5032 Chronic diastolic (congestive) heart failure: Secondary | ICD-10-CM | POA: Insufficient documentation

## 2020-08-24 DIAGNOSIS — Z7982 Long term (current) use of aspirin: Secondary | ICD-10-CM | POA: Diagnosis not present

## 2020-08-24 DIAGNOSIS — J45909 Unspecified asthma, uncomplicated: Secondary | ICD-10-CM | POA: Insufficient documentation

## 2020-08-24 DIAGNOSIS — Z20822 Contact with and (suspected) exposure to covid-19: Secondary | ICD-10-CM | POA: Insufficient documentation

## 2020-08-24 DIAGNOSIS — Z87891 Personal history of nicotine dependence: Secondary | ICD-10-CM | POA: Insufficient documentation

## 2020-08-24 DIAGNOSIS — I2510 Atherosclerotic heart disease of native coronary artery without angina pectoris: Secondary | ICD-10-CM | POA: Diagnosis not present

## 2020-08-24 DIAGNOSIS — Z794 Long term (current) use of insulin: Secondary | ICD-10-CM | POA: Insufficient documentation

## 2020-08-24 DIAGNOSIS — N3000 Acute cystitis without hematuria: Secondary | ICD-10-CM | POA: Insufficient documentation

## 2020-08-24 LAB — URINALYSIS, ROUTINE W REFLEX MICROSCOPIC
Bilirubin Urine: NEGATIVE
Glucose, UA: NEGATIVE mg/dL
Hgb urine dipstick: NEGATIVE
Ketones, ur: NEGATIVE mg/dL
Nitrite: POSITIVE — AB
Protein, ur: NEGATIVE mg/dL
Specific Gravity, Urine: 1.011 (ref 1.005–1.030)
pH: 7 (ref 5.0–8.0)

## 2020-08-24 LAB — CBC
HCT: 41.6 % (ref 36.0–46.0)
Hemoglobin: 13.2 g/dL (ref 12.0–15.0)
MCH: 27.8 pg (ref 26.0–34.0)
MCHC: 31.7 g/dL (ref 30.0–36.0)
MCV: 87.8 fL (ref 80.0–100.0)
Platelets: 316 10*3/uL (ref 150–400)
RBC: 4.74 MIL/uL (ref 3.87–5.11)
RDW: 13.3 % (ref 11.5–15.5)
WBC: 7.4 10*3/uL (ref 4.0–10.5)
nRBC: 0 % (ref 0.0–0.2)

## 2020-08-24 LAB — BASIC METABOLIC PANEL
Anion gap: 9 (ref 5–15)
BUN: 16 mg/dL (ref 8–23)
CO2: 29 mmol/L (ref 22–32)
Calcium: 9.4 mg/dL (ref 8.9–10.3)
Chloride: 101 mmol/L (ref 98–111)
Creatinine, Ser: 0.78 mg/dL (ref 0.44–1.00)
GFR, Estimated: >60 mL/min (ref >60)
Glucose, Bld: 179 mg/dL — ABNORMAL HIGH (ref 70–99)
Potassium: 4 mmol/L (ref 3.5–5.1)
Sodium: 139 mmol/L (ref 135–145)

## 2020-08-24 LAB — RESP PANEL BY RT-PCR (FLU A&B, COVID) ARPGX2
Influenza A by PCR: NEGATIVE
Influenza B by PCR: NEGATIVE
SARS Coronavirus 2 by RT PCR: NEGATIVE

## 2020-08-24 LAB — TROPONIN I (HIGH SENSITIVITY): Troponin I (High Sensitivity): 7 ng/L (ref <18)

## 2020-08-24 LAB — CBG MONITORING, ED: Glucose-Capillary: 145 mg/dL — ABNORMAL HIGH (ref 70–99)

## 2020-08-24 MED ORDER — MECLIZINE HCL 25 MG PO TABS
50.0000 mg | ORAL_TABLET | Freq: Three times a day (TID) | ORAL | Status: DC | PRN
Start: 2020-08-24 — End: 2020-08-26
  Administered 2020-08-25: 50 mg via ORAL
  Filled 2020-08-24: qty 2

## 2020-08-24 MED ORDER — LORAZEPAM 2 MG/ML IJ SOLN
1.0000 mg | Freq: Once | INTRAMUSCULAR | Status: AC | PRN
  Administered 2020-08-24: 1 mg via INTRAVENOUS
  Filled 2020-08-24: qty 1

## 2020-08-24 MED ORDER — CLONAZEPAM 0.5 MG PO TABS
0.5000 mg | ORAL_TABLET | Freq: Every day | ORAL | Status: DC
  Administered 2020-08-24: 0.5 mg via ORAL
  Filled 2020-08-24: qty 1

## 2020-08-24 MED ORDER — CLOPIDOGREL BISULFATE 75 MG PO TABS
75.0000 mg | ORAL_TABLET | Freq: Every day | ORAL | Status: DC
  Administered 2020-08-25: 75 mg via ORAL
  Filled 2020-08-24: qty 1

## 2020-08-24 MED ORDER — INSULIN LISPRO (0.5 UNIT DIAL) 100 UNIT/ML (KWIKPEN JR): 10.0000 [IU] | PEN_INJECTOR | Freq: Every day | SUBCUTANEOUS | Status: DC

## 2020-08-24 MED ORDER — CEPHALEXIN 250 MG PO CAPS
500.0000 mg | ORAL_CAPSULE | Freq: Two times a day (BID) | ORAL | Status: DC
  Administered 2020-08-25: 500 mg via ORAL
  Filled 2020-08-24: qty 2

## 2020-08-24 MED ORDER — ISOSORBIDE MONONITRATE ER 30 MG PO TB24
60.0000 mg | ORAL_TABLET | Freq: Every day | ORAL | Status: DC
  Administered 2020-08-25: 60 mg via ORAL
  Filled 2020-08-24: qty 2

## 2020-08-24 MED ORDER — BACLOFEN 10 MG PO TABS
40.0000 mg | ORAL_TABLET | Freq: Two times a day (BID) | ORAL | Status: DC
  Administered 2020-08-25 (×2): 40 mg via ORAL
  Filled 2020-08-24 (×2): qty 2
  Filled 2020-08-24: qty 4

## 2020-08-24 MED ORDER — LISINOPRIL 20 MG PO TABS
20.0000 mg | ORAL_TABLET | Freq: Every day | ORAL | Status: DC
  Administered 2020-08-25: 20 mg via ORAL
  Filled 2020-08-24: qty 1

## 2020-08-24 MED ORDER — EZETIMIBE 10 MG PO TABS
10.0000 mg | ORAL_TABLET | Freq: Every day | ORAL | Status: DC
  Administered 2020-08-25: 10 mg via ORAL
  Filled 2020-08-24: qty 1

## 2020-08-24 MED ORDER — CEPHALEXIN 250 MG PO CAPS
500.0000 mg | ORAL_CAPSULE | Freq: Once | ORAL | Status: AC
  Administered 2020-08-24: 500 mg via ORAL
  Filled 2020-08-24: qty 2

## 2020-08-24 MED ORDER — MECLIZINE HCL 25 MG PO TABS
25.0000 mg | ORAL_TABLET | Freq: Once | ORAL | Status: AC
  Administered 2020-08-24: 25 mg via ORAL
  Filled 2020-08-24: qty 1

## 2020-08-24 MED ORDER — FLUTICASONE-UMECLIDIN-VILANT 200-62.5-25 MCG/INH IN AEPB: 1.0000 | INHALATION_SPRAY | Freq: Every day | RESPIRATORY_TRACT | Status: DC

## 2020-08-24 MED ORDER — FAMOTIDINE 20 MG PO TABS: 40.0000 mg | ORAL_TABLET | Freq: Every day | ORAL | Status: DC | PRN

## 2020-08-24 MED ORDER — INSULIN ASPART PROT & ASPART (70-30 MIX) 100 UNIT/ML ~~LOC~~ SUSP: 30.0000 [IU] | Freq: Every day | SUBCUTANEOUS | Status: DC

## 2020-08-24 MED ORDER — NORTRIPTYLINE HCL 25 MG PO CAPS
100.0000 mg | ORAL_CAPSULE | Freq: Every day | ORAL | Status: DC
  Administered 2020-08-25: 100 mg via ORAL
  Filled 2020-08-24 (×2): qty 4

## 2020-08-24 MED ORDER — DILTIAZEM HCL ER COATED BEADS 240 MG PO CP24
240.0000 mg | ORAL_CAPSULE | Freq: Every day | ORAL | Status: DC
  Administered 2020-08-24: 240 mg via ORAL
  Filled 2020-08-24: qty 1

## 2020-08-24 MED ORDER — METOPROLOL SUCCINATE ER 25 MG PO TB24
100.0000 mg | ORAL_TABLET | Freq: Every day | ORAL | Status: DC
  Administered 2020-08-24 – 2020-08-25 (×2): 100 mg via ORAL
  Filled 2020-08-24 (×2): qty 4

## 2020-08-24 MED ORDER — DILTIAZEM HCL ER COATED BEADS 120 MG PO CP24: 240.0000 mg | ORAL_CAPSULE | ORAL | Status: DC

## 2020-08-24 MED ORDER — METOPROLOL SUCCINATE ER 25 MG PO TB24: 100.0000 mg | ORAL_TABLET | Freq: Every day | ORAL | Status: DC

## 2020-08-24 MED ORDER — ASPIRIN EC 81 MG PO TBEC
81.0000 mg | DELAYED_RELEASE_TABLET | Freq: Every day | ORAL | Status: DC
  Administered 2020-08-25: 81 mg via ORAL
  Filled 2020-08-24: qty 1

## 2020-08-24 MED ORDER — FUROSEMIDE 20 MG PO TABS
40.0000 mg | ORAL_TABLET | Freq: Every day | ORAL | Status: DC
  Administered 2020-08-25: 40 mg via ORAL
  Filled 2020-08-24: qty 2

## 2020-08-24 MED ORDER — SPIRONOLACTONE 12.5 MG HALF TABLET
12.5000 mg | ORAL_TABLET | ORAL | Status: DC
  Filled 2020-08-24: qty 1

## 2020-08-24 NOTE — ED Notes (Signed)
Hospital bed ordered for pt.

## 2020-08-24 NOTE — ED Notes (Signed)
Pt back, alert and oriented.  No issues at this time.

## 2020-08-24 NOTE — ED Notes (Signed)
Pt to MRI

## 2020-08-24 NOTE — ED Provider Notes (Signed)
[facility] EMERGENCY DEPARTMENT Provider Note   CSN: [id number] Arrival date & time: 08/24/20  1443     History Chief Complaint  Patient presents with   Hypertension   Dizziness    [name] is a 80 y.o. female with history of coronary disease status post CABG, hypertension, diabetes, presented to emergency department with high blood pressure and vertigo and dizziness.  The patient reports onset of her symptoms yesterday.  She says she has persistently felt like "I cannot walk straight and my head is spinning".  She reports a very mild headache.  She denies any chest pain or pressure.  She reports he does have a history of vertigo but the symptoms feel very different to her.  She noted her blood pressure was much higher than normal today, 190/110 mmhg at home.  Normal BP is 737-106 systolic.  She is religiously compliant with all of her blood pressure medications.  This is an extensive regimen for BP meds and she did have spironolactone added on two weeks ago by her cardiologist (12.5 mg).  She took her morning medications as prescribed.  In the evening she takes her diltiazem and metoprolol and is due for that now at 530.  She denies any history of stroke or TIA.  She quit smoking 30 years ago.  She does report some fatigue and nausea as well.  She has received all the COVID vaccines.  She does report she is a history of UTIs and does feel that she is having some dysuria recently.  HPI     Past Medical History:  Diagnosis Date   Adenomatous colon polyp    Allergy    Anxiety    Asthma    Carotid artery disease ([place])    carotid US 02/2017: bilat ICA 1-39%   Chronic diastolic CHF    Echo 03/6946: EF 65-70, Gr 2 DD, mild MS (mean 5), PASP 44   COPD    pt is unsure if has been officially diagnosed   Coronary artery disease    CABG '09- cathed 12/09, 9/10, 6/11, 3/14 and 12/13/16- medical Rx // cath 12/2016 - 2/3 grafts patent >> med Rx // Myoview 12/17: low  risk   Diabetes mellitus    Gastroesophageal reflux disease    Hiatal hernia    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   Hyperlipidemia    Hypertension    PONV (postoperative nausea and vomiting)    Schatzki's ring    Shoulder injury    resolved after shoulder surgery   Sleep apnea    not on cpap    Patient Active Problem List   Diagnosis Date Noted   Coronary artery disease    Pelvic pain 10/30/2019   Chest pain at rest 08/24/2018   Arthralgia 08/01/2017   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   Angina pectoris ([place]) 01/05/2017   Insomnia 11/23/2016   Chronic nonintractable headache 11/23/2016   Pancreatic insufficiency 10/28/2016   Carotid artery disease ([place]) 05/19/2016   Fatigue 05/19/2016   Anemia 03/27/2016   Type 1 diabetes mellitus ([place]) 07/21/2015   B12 deficiency 03/31/2015   OSA (obstructive sleep apnea) with hypoxia 11/20/2014   Fecal incontinence 08/28/2014   Neurologic gait dysfunction 08/28/2014   Falls 08/28/2014   Neck pain of over 3 months duration 08/28/2014   H/O total knee replacement 02/20/2014   Obesity (BMI 30-39.9) 11/12/2013   Chronic diastolic CHF (congestive heart failure) ([place]) 11/17/2012  Meniscus, lateral, anterior horn derangement 11/10/2011   Medial meniscus, posterior horn derangement 11/10/2011   Osteoarthritis of right knee 11/10/2011   Vertigo 10/05/2011   Labile hypertension 10/05/2011   CAD (coronary artery disease) 08/27/2010   MUSCLE CRAMPS 12/29/2009   History of myocardial infarction 11/19/2009   Extrinsic asthma 11/19/2009   GERD 11/19/2009   Cough 11/19/2009   Hyperlipidemia 11/18/2009    Past Surgical History:  Procedure Laterality Date   ABDOMINAL HYSTERECTOMY     APPENDECTOMY     came out with Hysterectomy   CARDIAC CATHETERIZATION  07/23/2009   EF 60%   CARDIAC CATHETERIZATION  10/11/2008   CARDIAC CATHETERIZATION  03/01/2007   EF 75-80%   CARDIOVASCULAR STRESS TEST  11/15/2007   EF 60%   COLONOSCOPY      CORONARY ARTERY BYPASS GRAFT     SEVERELY DISEASED SAPHENOUS VEIN GRAFT TO THE RIGHT CORONARY ARTERY BUT WITH FAIRLY WELL PRESERVED FLOW TO THE DISTAL RIGHT CORONARY ARTERY FROM THE NATIVE CIRCULATION-RESTART  CATH IN JUNE 2000, REVEALS MILD/MODERATE  CAD WITH GOOD FLOW DOWN HER LAD   ESOPHAGOGASTRODUODENOSCOPY     EYE SURGERY     bilateral cataract surgery with lens implant   LEFT HEART CATH AND CORS/GRAFTS ANGIOGRAPHY N/A 12/14/2016   Procedure: LEFT HEART CATH AND CORS/GRAFTS ANGIOGRAPHY;  Surgeon: Jettie Booze, MD;  Location: [facility];  Service: Cardiovascular;  Laterality: N/A;   lense removal Left    POLYPECTOMY     ROTATOR CUFF REPAIR     right and left   TONSILLECTOMY     age 52   TOTAL KNEE ARTHROPLASTY Right 02/20/2014   Procedure: RIGHT TOTAL KNEE ARTHROPLASTY;  Surgeon: Tobi Bastos, MD;  Location: [facility];  Service: Orthopedics;  Laterality: Right;   tumor removed kidney     UPPER GASTROINTESTINAL ENDOSCOPY     US ECHOCARDIOGRAPHY  03/08/2008   EF 55-60%     OB History   No obstetric history on file.     Family History  Problem Relation Age of Onset   Heart disease Maternal Grandfather    Heart failure Maternal Grandfather    Diabetes Maternal Grandfather    Heart attack Father    Diabetes Mother    Rheum arthritis Sister    Emphysema Paternal Uncle    Esophageal cancer Brother 2       she said he was born with it   Emphysema Paternal Aunt    Healthy Child    Neuropathy Neg Hx    Multiple sclerosis Neg Hx    Colon cancer Neg Hx    Colon polyps Neg Hx    Rectal cancer Neg Hx    Stomach cancer Neg Hx     Social History   Tobacco Use   Smoking status: Former    Packs/day: 0.50    Years: 21.00    Pack years: 10.50    Types: Cigarettes    Start date: 02/09/1955    Quit date: 02/09/1976    Years since quitting: 44.5   Smokeless tobacco: Never  Vaping Use   Vaping Use: Never used  Substance Use Topics   Alcohol use: No     Alcohol/week: 0.0 standard drinks   Drug use: No    Home Medications Prior to Admission medications   Medication Sig Start Date End Date Taking? Authorizing Provider  albuterol (PROVENTIL HFA;VENTOLIN HFA) 108 (90 BASE) MCG/ACT inhaler Inhale 2 puffs into the lungs every 6 (six) hours as needed.  For shortness of breath. 03/06/12   Elsie Stain, MD  albuterol (PROVENTIL) (2.5 MG/3ML) 0.083% nebulizer solution USE 1 VIAL IN NEBULIZER 4 TIMES DAILY 05/07/20   Mannam, Hart Robinsons, MD  alum & mag hydroxide-simeth (MAALOX/MYLANTA) 200-200-20 MG/5ML suspension Take 30 mLs as needed by mouth for indigestion or heartburn.    [provider]  aspirin EC 81 MG tablet Take 1 tablet (81 mg total) by mouth daily. 12/10/16   Richardson Dopp T, PA-C  baclofen (LIORESAL) 20 MG tablet Take 2 tablets (40 mg total) by mouth 2 (two) times daily. Due for f/u appt. 10/30/19   Binnie Rail, MD  Bempedoic Acid (NEXLETOL) 180 MG TABS Take 180 mg by mouth daily. 02/11/20   Nahser, Wonda Cheng, MD  benzonatate (TESSALON) 200 MG capsule Take 1 capsule (200 mg total) by mouth 3 (three) times daily as needed for cough. 10/30/19   Binnie Rail, MD  Blood Glucose Monitoring Suppl (ONE TOUCH ULTRA MINI) w/Device KIT 3 (three) times daily. for testing 02/26/15   [provider]  calcium carbonate (OS-CAL) 1250 (500 Ca) MG chewable tablet Chew 1 tablet by mouth as needed.    [provider]  calcium carbonate (TUMS - DOSED IN MG ELEMENTAL CALCIUM) 500 MG chewable tablet Chew 2 tablets daily as needed by mouth for indigestion or heartburn.    [provider]  chlorhexidine (PERIDEX) 0.12 % solution SMARTSIG:By Mouth 02/27/20   [provider]  Cholecalciferol (VITAMIN D) 2000 UNITS tablet Take 2,000 Units by mouth daily.    [provider]  clonazePAM (KLONOPIN) 0.5 MG tablet Take 0.5 mg daily by mouth.    [provider]  clopidogrel (PLAVIX) 75 MG tablet Take 75 mg by mouth  daily. 10/03/19   [provider]  Continuous Blood Gluc Sensor (FREESTYLE LIBRE 14 DAY SENSOR) MISC See admin instructions. 05/22/18   [provider]  Cyanocobalamin (VITAMIN B-12 IJ) Inject 1,000 mcg as directed every 30 (thirty) days.    [provider]  cyclobenzaprine (FLEXERIL) 5 MG tablet Take 1 tablet by mouth as needed.    [provider]  diclofenac sodium (VOLTAREN) 1 % GEL Apply 2 g topically 4 (four) times daily as needed (muscle pain).    [provider]  diltiazem (CARDIZEM CD) 240 MG 24 hr capsule TAKE ONE CAPSULE BY MOUTH EVERY DAY 06/02/20   Binnie Rail, MD  diltiazem (CARDIZEM CD) 300 MG 24 hr capsule Take 1 capsule (300 mg total) by mouth daily. 03/05/20   Nahser, Wonda Cheng, MD  estradiol (ESTRACE) 0.1 MG/GM vaginal cream Place vaginally. 04/02/20   [provider]  ezetimibe (ZETIA) 10 MG tablet Take 1 tablet (10 mg total) by mouth daily. 06/26/20   Nahser, Wonda Cheng, MD  famotidine (PEPCID) 40 MG tablet Take 1 tablet (40 mg total) by mouth daily as needed. 04/20/18   Burns, Claudina Lick, MD  fexofenadine (ALLEGRA) 180 MG tablet TAKE 1 TABLET BY MOUTH EVERY DAY 09/06/19   Mannam, Praveen, MD  Fluticasone-Salmeterol (ADVAIR DISKUS) 250-50 MCG/DOSE AEPB Inhale 1 puff into the lungs 2 (two) times daily. 06/12/18   Fenton Foy, NP  Fluticasone-Umeclidin-Vilant (TRELEGY ELLIPTA) 200-62.5-25 MCG/INH AEPB Inhale 1 puff into the lungs daily. 06/05/19   Mannam, Hart Robinsons, MD  furosemide (LASIX) 40 MG tablet Take 1 tablet (40 mg total) by mouth daily. 03/15/17   Bhagat, Crista Luria, PA  insulin lispro (INSULIN LISPRO) 100 UNIT/ML KwikPen Junior Inject 10-15 Units into the skin daily.  [provider]  insulin lispro protamine-insulin lispro (HUMALOG 75/25) (75-25) 100 UNIT/ML SUSP Inject 15-22 Units into the skin See admin instructions. Takes 16 units in the morning, 10 units at lunch, and 25  units with supper    [provider]   isosorbide mononitrate (IMDUR) 60 MG 24 hr tablet Take one tablet ( 60 mg ) twice daily 8 hours apart. 05/14/20   Richardson Dopp T, PA-C  lisinopril (ZESTRIL) 20 MG tablet Take 1 tablet by mouth daily.    [provider]  loperamide (IMODIUM) 2 MG capsule Take 2 mg by mouth as needed for diarrhea or loose stools.     [provider]  meclizine (ANTIVERT) 25 MG tablet TAKE ONE TABLET BY MOUTH TWICE DAILY AS NEEDED FOR DIZZINESS 06/02/20   Binnie Rail, MD  metoprolol succinate (TOPROL-XL) 100 MG 24 hr tablet TAKE ONE TABLET BY MOUTH EVERY DAY 06/02/20   Nahser, Wonda Cheng, MD  Multiple Vitamin (MULTIVITAMIN WITH MINERALS) TABS Take 1 tablet by mouth daily.    [provider]  Nebulizers (COMPRESSOR/NEBULIZER) MISC Use with albuterol 04/24/14   Elsie Stain, MD  neomycin-polymyxin b-dexamethasone (MAXITROL) 3.5-10000-0.1 SUSP Place 1 drop into the right eye 4 (four) times daily. 04/15/20   [provider]  nitroGLYCERIN (NITROSTAT) 0.4 MG SL tablet Place 0.4 mg under the tongue every 5 (five) minutes as needed for chest pain.    [provider]  nortriptyline (PAMELOR) 50 MG capsule TAKE 2 CAPSULES (100 MG TOTAL) BY MOUTH AT BEDTIME. 07/29/20   Burns, Claudina Lick, MD  ONE TOUCH ULTRA TEST test strip CHECK BLOOD SUGAR TWICE A DAY DX: E11.9 03/26/15   [provider]  [facility] DELICA LANCETS 83J MISC 3 (three) times daily. for testing 02/26/15   [provider]  pantoprazole sodium (PROTONIX) 40 mg/20 mL PACK 1 packet    [provider]  RABEprazole (ACIPHEX) 20 MG tablet TAKE ONE TABLET BY MOUTH EVERY DAY 06/02/20   Binnie Rail, MD  Spacer/Aero-Holding Chambers (AEROCHAMBER MV) inhaler Use as instructed 08/13/16   Javier Glazier, MD  spironolactone (ALDACTONE) 25 MG tablet Take 0.5 tablets (12.5 mg total) by mouth daily. 08/13/20 09/17/20  Richardson Dopp T, PA-C  Study - ORION 4 - inclisiran 300 mg/1.38m or placebo SQ injection (PI-Stuckey)  Inject 300 mg into the skin every 6 (six) months. 01/17/18   [provider]  Tiotropium Bromide Monohydrate (SPIRIVA RESPIMAT) 2.5 MCG/ACT AERS Inhale 2 puffs into the lungs daily. 08/01/18   MLauraine Rinne NP    Allergies    Amlodipine, Banana, Co q10 [coenzyme q10], Codeine, Metformin and related, Morphine, Pentazocine, Pravastatin, Repatha [evolocumab], Statins, Sulfa antibiotics, Sulfonamide derivatives, and Tramadol hcl  Review of Systems   Review of Systems  Constitutional:  Negative for chills and fever.  Eyes:  Negative for pain and visual disturbance.  Respiratory:  Negative for cough and shortness of breath.   Cardiovascular:  Negative for chest pain and palpitations.  Gastrointestinal:  Negative for abdominal pain and vomiting.  Genitourinary:  Positive for dysuria. Negative for hematuria.  Musculoskeletal:  Negative for arthralgias and back pain.  Skin:  Negative for color change and rash.  Neurological:  Positive for dizziness, light-headedness and headaches. Negative for syncope, facial asymmetry, speech difficulty, weakness and numbness.  All other systems reviewed and are negative.  Physical Exam Updated Vital Signs BP 130/83   Pulse 88   Temp 98.5 F (36.9 C) (Oral)   Resp  16   LMP  (LMP Unknown)   SpO2 98%   Physical Exam Constitutional:      General: She is not in acute distress. HENT:     Head: Normocephalic and atraumatic.  Eyes:     Conjunctiva/sclera: Conjunctivae normal.     Pupils: Pupils are equal, round, and reactive to light.  Cardiovascular:     Rate and Rhythm: Normal rate and regular rhythm.  Pulmonary:     Effort: Pulmonary effort is normal. No respiratory distress.  Abdominal:     General: There is no distension.     Tenderness: There is no abdominal tenderness.  Skin:    General: Skin is warm and dry.  Neurological:     General: No focal deficit present.     Mental Status: She is alert and oriented to person, place, and time.  Mental status is at baseline.     Sensory: No sensory deficit.     Motor: No weakness.     Gait: Gait normal.     Comments: Patient demonstrating active vertigo symptoms and nystagmus Head impulse shows corrective saccades Unilateral horizontal nystagmus present, rightward gaze Test of skew shows no skew   Psychiatric:        Mood and Affect: Mood normal.        Behavior: Behavior normal.    ED Results / Procedures / Treatments   Labs (all labs ordered are listed, but only abnormal results are displayed) Labs Reviewed  BASIC METABOLIC PANEL - Abnormal; Notable for the following components:      Result Value   Glucose, Bld 179 (*)    All other components within normal limits  URINALYSIS, ROUTINE W REFLEX MICROSCOPIC - Abnormal; Notable for the following components:   APPearance HAZY (*)    Nitrite POSITIVE (*)    Leukocytes,Ua SMALL (*)    Bacteria, UA RARE (*)    All other components within normal limits  CBG MONITORING, ED - Abnormal; Notable for the following components:   Glucose-Capillary 145 (*)    All other components within normal limits  RESP PANEL BY RT-PCR (FLU A&B, COVID) ARPGX2  URINE CULTURE  CBC  TROPONIN I (HIGH SENSITIVITY)  TROPONIN I (HIGH SENSITIVITY)    EKG EKG Interpretation  Date/Time:  Sunday August 24 2020 14:55:58 EDT Ventricular Rate:  111 PR Interval:  156 QRS Duration: 146 QT Interval:  372 QTC Calculation: 505 R Axis:   -34 Text Interpretation: Sinus tachycardia Left axis deviation No significant change from prior tracing Confirmed by Octaviano Glow [phone]) on 08/24/2020 7:33:32 PM  Radiology MR BRAIN WO CONTRAST  Result Date: 08/24/2020 CLINICAL DATA:  Initial evaluation for acute dizziness, ataxia, vertigo. EXAM: MRI HEAD WITHOUT CONTRAST TECHNIQUE: Multiplanar, multiecho pulse sequences of the brain and surrounding structures were obtained without intravenous contrast. COMPARISON:  Prior MRI from 09/16/2014. FINDINGS: Brain:  Examination moderately degraded by motion artifact. Cerebral volume within normal limits. No significant cerebral white matter disease or other focal parenchymal signal abnormality seen on this motion degraded exam. No abnormal foci of restricted diffusion to suggest acute or subacute ischemia. Gray-white matter differentiation maintained. No encephalomalacia to suggest chronic cortical infarction. No visible foci of susceptibility artifact to suggest acute or chronic intracranial hemorrhage. No mass lesion, midline shift or mass effect no hydrocephalus or extra-axial fluid collection. Empty sella noted. Midline structures intact. Vascular: Major intracranial vascular flow voids are maintained at the skull base. Skull and upper cervical spine: Craniocervical junction within normal limits. Upper cervical  spine within normal limits. Bone marrow signal intensity normal. No scalp soft tissue abnormality. Sinuses/Orbits: Prior bilateral ocular lens replacement. Paranasal sinuses are grossly clear. No mastoid effusion. Other: None. IMPRESSION: 1. Motion degraded exam. 2. No acute intracranial abnormality. 3. Empty sella. While this finding is often incidental in nature and of no clinical significance, this can also be seen in the setting of idiopathic intracranial hypertension. Electronically Signed   By: Jeannine Boga M.D.   On: 08/24/2020 21:26    Procedures Procedures   Medications Ordered in ED Medications  metoprolol succinate (TOPROL-XL) 24 hr tablet 100 mg (100 mg Oral Given 08/24/20 1740)  meclizine (ANTIVERT) tablet 50 mg (has no administration in time range)  cephALEXin (KEFLEX) capsule 500 mg (has no administration in time range)  clonazePAM (KLONOPIN) tablet 0.5 mg (0.5 mg Oral Given 08/24/20 2247)  aspirin EC tablet 81 mg (has no administration in time range)  baclofen (LIORESAL) tablet 40 mg (40 mg Oral Given 08/25/20 0039)  clopidogrel (PLAVIX) tablet 75 mg (has no administration in time  range)  diltiazem (CARDIZEM CD) 24 hr capsule 240 mg (has no administration in time range)  ezetimibe (ZETIA) tablet 10 mg (has no administration in time range)  famotidine (PEPCID) tablet 40 mg (has no administration in time range)  Fluticasone-Umeclidin-Vilant 200-62.5-25 MCG/INH AEPB 1 puff (has no administration in time range)  furosemide (LASIX) tablet 40 mg (has no administration in time range)  insulin lispro (HUMALOG) KwikPen JR 10-15 Units (has no administration in time range)  insulin aspart protamine- aspart (NOVOLOG MIX 70/30) injection 30 Units (has no administration in time range)  lisinopril (ZESTRIL) tablet 20 mg (has no administration in time range)  isosorbide mononitrate (IMDUR) 24 hr tablet 60 mg (has no administration in time range)  nortriptyline (PAMELOR) capsule 100 mg (100 mg Oral Given 08/25/20 0038)  spironolactone (ALDACTONE) tablet 12.5 mg (has no administration in time range)  meclizine (ANTIVERT) tablet 25 mg (25 mg Oral Given 08/24/20 1740)  LORazepam (ATIVAN) injection 1 mg (1 mg Intravenous Given 08/24/20 1952)  cephALEXin (KEFLEX) capsule 500 mg (500 mg Oral Given 08/24/20 1921)    ED Course  I have reviewed the triage vital signs and the nursing notes.  Pertinent labs & imaging results that were available during my care of the patient were reviewed by me and considered in my medical decision making (see chart for details).  This patient complains of dizziness, headache, vertigo. This involves an extensive number of treatment options, and is a complaint that carries with it a high risk of complications and morbidity.  The differential diagnosis includes peripheral vertigo vs infection vs other  She has a known history of vertigo and says she has not had an episode in "some time".  Her HINTS exam today is consistent with peripheral vertigo.  I ordered, reviewed, and interpreted labs.  No life-threatening abnormalities were noted on these tests.  No evidence of  significant dehydration, electrolyte derangement, anemia, leukocytosis.  She does have signs of UTI with positive leukocytes and nitrates.  CT scan of the head and MRI of the brain were reviewed showing no focal infarcts or posterior infarct.  On MRI there are questionable signs of elevated ICP but she has no significant headache or visual symptoms to suggest this on exam.  No evidence of PRES on imaging.   COVID test is negative.  Troponin is 7.  EKG reviewed showing baseline rhythm with no acute ischemic changes.  Patient was given her evening scheduled  blood pressure medications and her blood pressure has stabilized.  I doubt that this hypertensive emergency.  She has no blurred vision, chest pain.  No evidence of brain insult, or cardiac emergency  No signs of DKA or glycemic crisis.  Based on this clinical presentation, suspect the patient is suffering from a resurgence of her prior vertigo in the setting of a UTI.  She was given meclizine in the ED and had some minimal improvement.  However during my reassessment she felt that she was still too woozy and unsteady standing up from the bed.  I advised that we bring her to observation overnight, continue to give her antibiotics to treat UTI, as well as meclizine.  We will have PT evaluate her in the morning.  If she is more stable, I have already spoken to her granddaughter by phone, and family can assist her if needed for the next few days at home.  Best contacts are her daughter and granddaughter by phone.  Her home medications were ordered, confirming her evening dose of long acting insulin 30 units, as well as clonopin and baclofen (chronic) at night.   Clinical Course as of 08/25/20 0104  Sun Aug 24, 2020  1903 Nitrite(!): POSITIVE [MT]  1903 Chalmers Guest): SMALL [MT]  1903 Likely UTI.  We will start her on Keflex. [MT]    Clinical Course User Index [MT] Tyaira Heward, Carola Rhine, MD    Final Clinical Impression(s) / ED Diagnoses Final  diagnoses:  Vertigo  Acute cystitis without hematuria    Rx / DC Orders ED Discharge Orders     None        Langston Masker Carola Rhine, MD 08/25/20 [phone]

## 2020-08-24 NOTE — ED Notes (Signed)
Pt ambulatory to BR with steady gait.

## 2020-08-24 NOTE — ED Triage Notes (Signed)
Patient coming from home, complaint of high blood pressure and dizziness that started yesterday.

## 2020-08-25 LAB — CBG MONITORING, ED
Glucose-Capillary: 274 mg/dL — ABNORMAL HIGH (ref 70–99)
Glucose-Capillary: 203 mg/dL — ABNORMAL HIGH (ref 70–99)
Glucose-Capillary: 116 mg/dL — ABNORMAL HIGH (ref 70–99)

## 2020-08-25 MED ORDER — DEXTROSE 50 % IV SOLN: 1.0000 | INTRAVENOUS | Status: DC | PRN

## 2020-08-25 MED ORDER — INSULIN GLARGINE 100 UNIT/ML ~~LOC~~ SOLN
15.0000 [IU] | Freq: Every day | SUBCUTANEOUS | Status: DC
  Filled 2020-08-25: qty 0.15

## 2020-08-25 MED ORDER — INSULIN ASPART 100 UNIT/ML IJ SOLN
5.0000 [IU] | Freq: Once | INTRAMUSCULAR | Status: AC
  Administered 2020-08-25: 5 [IU] via SUBCUTANEOUS

## 2020-08-25 MED ORDER — CEPHALEXIN 500 MG PO CAPS: 500.0000 mg | ORAL_CAPSULE | Freq: Four times a day (QID) | ORAL | 0 refills | Status: DC

## 2020-08-25 MED ORDER — INSULIN ASPART 100 UNIT/ML IJ SOLN
0.0000 [IU] | Freq: Three times a day (TID) | INTRAMUSCULAR | Status: DC
Start: 2020-08-25 — End: 2020-08-26

## 2020-08-25 NOTE — ED Provider Notes (Signed)
Emergency Medicine Observation Re-evaluation Note  [name] is a 80 y.o. female, seen on rounds today.  Pt initially presented to the ED for complaints of Hypertension and Dizziness Currently, the patient is awaiting PT/SW eval. Today reports improvement of dizziness relative to time of presentation but still getting a lot of movement sensation with position change. Reports was able to walk to bathroom earlier which is an improvement.  Physical Exam  BP (!) 142/74   Pulse 81   Temp 98.5 F (36.9 C) (Oral)   Resp 19   LMP  (LMP Unknown)   SpO2 94%  Physical Exam General: alert, NAD Cardiac: regular, no sig RMG Lungs: CTA Psych: alert, pleasant EXT: no peripheral edema or swelling  ED Course / MDM  EKG:EKG Interpretation  Date/Time:  Sunday August 24 2020 14:55:58 EDT Ventricular Rate:  111 PR Interval:  156 QRS Duration: 146 QT Interval:  372 QTC Calculation: 505 R Axis:   -34 Text Interpretation: Sinus tachycardia Left axis deviation No significant change from prior tracing Confirmed by Octaviano Glow [phone]) on 08/24/2020 7:33:32 PM  I have reviewed the labs performed to date as well as medications administered while in observation.  Recent changes in the last 24 hours include slight subjective improvement in vertigo.  Plan  Current plan is for PT eval for stability at home, treatment for UTI and home meds.    Charlesetta Shanks, MD 08/25/20 [phone]

## 2020-08-25 NOTE — Care Management (Signed)
HH recommendation. Patient has [facility] referral sent to [facility] who declined the the referral called [facility] referral accepted, Information placed on AVS  CSW spoke with patient who also felt she would benefit from a bedside commode which was ordered provided to patient prior to discharge from the ED. No further ED TOC needs identified.

## 2020-08-25 NOTE — Evaluation (Addendum)
Physical Therapy Evaluation Patient Details Name: [name] MRN: [id number] DOB: 05-06-1940 Today's Date: 08/25/2020   History of Present Illness  Pt is a 80 y.o. F who presents with dizziness. Found to have UTI. Significant PMH: CAD s/p CABG, HTN, diabetes.  Clinical Impression  Prior to admission, pt lives at home alone and is independent. Pt presents with decreased functional mobility secondary to orthostatic hypotension and balance deficits. Pt positive for orthostatic hypotension upon assessment and symptomatic (see below); reports "double vision," when she sits up that does not improve with either eye occluded. No nystagmus noted with smooth pursuits. Pt ambulating x 100 feet with handheld assist. See below for recommendations.  Orthostatic Vitals: Supine: 111/70 Sitting: 82/48 (58), 86 bpm  Sitting after ~5 minutes: 105/73 (85), 84 bpm Standing: 87/58 (68), 88 bpm    Follow Up Recommendations Home health PT;Supervision/Assistance - 24 hour    Equipment Recommendations  None recommended by PT    Recommendations for Other Services       Precautions / Restrictions Precautions Precautions: Fall;Other (comment) Precaution Comments: orthostatic hypotension Restrictions Weight Bearing Restrictions: No      Mobility  Bed Mobility Overal bed mobility: Modified Independent                  Transfers Overall transfer level: Needs assistance Equipment used: None Transfers: Sit to/from Stand Sit to Stand: Supervision         General transfer comment: supervision for safety  Ambulation/Gait Ambulation/Gait assistance: Min guard Gait Distance (Feet): 100 Feet Assistive device: None Gait Pattern/deviations: Step-through pattern;Decreased stride length Gait velocity: decreased   General Gait Details: Mild dynamic unsteadiness, handheld assist for balance, slower pace  Stairs            Wheelchair Mobility    Modified Rankin (Stroke Patients Only)        Balance Overall balance assessment: Mild deficits observed, not formally tested                                           Pertinent Vitals/Pain Pain Assessment: No/denies pain    Home Living Family/patient expects to be discharged to:: Private residence Living Arrangements: Alone Available Help at Discharge: Family;Available PRN/intermittently Type of Home: House Home Access: Level entry     Home Layout: One level Home Equipment: Walker - 2 wheels;Cane - single point      Prior Function Level of Independence: Independent               Hand Dominance        Extremity/Trunk Assessment   Upper Extremity Assessment Upper Extremity Assessment: Overall WFL for tasks assessed    Lower Extremity Assessment Lower Extremity Assessment: Overall WFL for tasks assessed       Communication   Communication: No difficulties  Cognition Arousal/Alertness: Awake/alert Behavior During Therapy: WFL for tasks assessed/performed Overall Cognitive Status: Within Functional Limits for tasks assessed                                        General Comments      Exercises General Exercises - Lower Extremity Ankle Circles/Pumps: Both;20 reps;Seated Long Arc Quad: Both;10 reps;Seated   Assessment/Plan    PT Assessment Patient needs continued PT services  PT Problem List Decreased strength;Decreased  activity tolerance;Decreased balance;Decreased mobility       PT Treatment Interventions DME instruction;Gait training;Functional mobility training;Therapeutic activities;Therapeutic exercise;Balance training;Patient/family education    PT Goals (Current goals can be found in the Care Plan section)  Acute Rehab PT Goals Patient Stated Goal: less dizziness PT Goal Formulation: With patient Time For Goal Achievement: 09/08/20 Potential to Achieve Goals: Good    Frequency Min 3X/week   Barriers to discharge        Co-evaluation                AM-PAC PT "6 Clicks" Mobility  Outcome Measure Help needed turning from your back to your side while in a flat bed without using bedrails?: None Help needed moving from lying on your back to sitting on the side of a flat bed without using bedrails?: None Help needed moving to and from a bed to a chair (including a wheelchair)?: A Little Help needed standing up from a chair using your arms (e.g., wheelchair or bedside chair)?: A Little Help needed to walk in hospital room?: A Little Help needed climbing 3-5 steps with a railing? : A Little 6 Click Score: 20    End of Session Equipment Utilized During Treatment: Gait belt Activity Tolerance: Other (comment) (hypotensive) Patient left: in bed;with call bell/phone within reach Nurse Communication: Mobility status;Other (comment) (orthostatic hypotension) PT Visit Diagnosis: Unsteadiness on feet (R26.81);Dizziness and giddiness (R42)    Time: 9024-0973 PT Time Calculation (min) (ACUTE ONLY): 21 min   Charges:   PT Evaluation $PT Eval Moderate Complexity: 1 Mod          Wyona Almas, PT, DPT Acute Rehabilitation Services Pager [phone] Office [phone]   Deno Etienne 08/25/2020, 4:25 PM

## 2020-08-25 NOTE — ED Notes (Signed)
Updated pt daughter. Pt is resting at this time. Pt is a&o x4 .

## 2020-08-25 NOTE — ED Notes (Signed)
Patient dressed herself. Meal tray provided and is currently eating.

## 2020-08-25 NOTE — Discharge Instructions (Addendum)
Please take all antibiotics as prescribed Use meclizine as needed for vertigo Home health care has been arranged

## 2020-08-25 NOTE — Progress Notes (Signed)
Inpatient Diabetes Program Recommendations  AACE/ADA: New Consensus Statement on Inpatient Glycemic Control   Target Ranges:  Prepandial:   less than 140 mg/dL      Peak postprandial:   less than 180 mg/dL (1-2 hours)      Critically ill patients:  140 - 180 mg/dL   Results for Rugg, Bibi L (MRN [id number]) as of 08/25/2020 13:40  Ref. Range 08/24/2020 17:16 08/25/2020 10:25  Glucose-Capillary Latest Ref Range: 70 - 99 mg/dL 145 (H) 274 (H)    Review of Glycemic Control  Diabetes history: DM1 Outpatient Diabetes medications: Humalog 75/25 15 units in AM, 5 units at noon, 30 units in PM Current orders for Inpatient glycemic control: None  Inpatient Diabetes Program Recommendations:    Insulin: If patient will continue to be in Emergency Department, please consider ordering Lantus 15 units Q24H, CBGs AC&HS, Novolog 0-9 units AC&HS.  NOTE: Per chart, patient has Type 1 DM. Therefore, she will require insulin for glucose control. Patient takes Humalog 75/25 as an outpatient. Patient in the Emergency Department currently and glucose noted to be 145 mg/dl at 08/24/20 at 17:16 and 274 mg/dl today at 10:25 am. If patient will remain in the ED, she will need to have insulin ordered and given for DM control.   Thanks, Barnie Alderman, RN, MSN, CDE Diabetes Coordinator Inpatient Diabetes Program [phone] (Team Pager from 8am to 5pm)

## 2020-08-25 NOTE — ED Provider Notes (Signed)
  Physical Exam  BP 134/71   Pulse 80   Temp 98.3 F (36.8 C) (Oral)   Resp 18   LMP  (LMP Unknown)   SpO2 97%   Physical Exam  ED Course/Procedures   Clinical Course as of 08/25/20 1736  Sun Aug 24, 2020  1903 Nitrite(!): POSITIVE [MT]  1903 Chalmers Guest): SMALL [MT]  1903 Likely UTI.  We will start her on Keflex. [MT]    Clinical Course User Index [MT] Trifan, Carola Rhine, MD    Procedures  MDM  80 year old female who presents to the ED with vertigo.  She had CT and MRI that did not show any intracranial abnormalities.  She was felt to have a urinary tract infection and started on Keflex.  She was observed in the ED and physical therapy has assessed.  Home health has been arranged.  She now appears ready for discharge.       Pattricia Boss, MD 08/25/20 [phone]

## 2020-08-25 NOTE — ED Notes (Signed)
Pt's daughter called and was updated on pt's status and plan of care. Pt currently talking with daughter.

## 2020-08-26 ENCOUNTER — Ambulatory Visit: Payer: Medicare Other | Admitting: Internal Medicine

## 2020-08-27 LAB — URINE CULTURE: Culture: >=100000 — AB

## 2020-08-28 ENCOUNTER — Other Ambulatory Visit: Payer: Medicare Other

## 2020-08-28 ENCOUNTER — Ambulatory Visit (INDEPENDENT_AMBULATORY_CARE_PROVIDER_SITE_OTHER): Payer: Medicare Other | Admitting: Pharmacist

## 2020-08-28 ENCOUNTER — Other Ambulatory Visit: Payer: Self-pay

## 2020-08-28 ENCOUNTER — Telehealth: Payer: Self-pay | Admitting: *Deleted

## 2020-08-28 VITALS — BP 132/84

## 2020-08-28 DIAGNOSIS — R0989 Other specified symptoms and signs involving the circulatory and respiratory systems: Secondary | ICD-10-CM

## 2020-08-28 DIAGNOSIS — E782 Mixed hyperlipidemia: Secondary | ICD-10-CM

## 2020-08-28 MED ORDER — SPIRONOLACTONE 25 MG PO TABS
25.0000 mg | ORAL_TABLET | Freq: Every day | ORAL | 11 refills | Status: DC
Start: 2020-08-28 — End: 2020-09-23

## 2020-08-28 MED ORDER — NEXLIZET 180-10 MG PO TABS: 1.0000 | ORAL_TABLET | Freq: Every day | ORAL | 3 refills | Status: DC

## 2020-08-28 NOTE — Progress Notes (Signed)
Patient ID: MELA PERHAM                 DOB: October 29, 1940                      MRN: [id number]     HPI: [name] is a 80 y.o. female patient of Dr Acie Fredrickson referred by Richardson Dopp, PA to HTN clinic. PMH is significant for CAD s/p CABG, HTN, HLD with statin intolerance, DM, chronic diastolic CHF with EF 65-46% 02/2017 and G2DD, GERD, OSA, COPD, asthma, and former tobacco abuse. Previously followed by PharmD in 2019 for HTN and lipids. She was enrolled in the ORION-4 trial investigating inclisiran given her statin and PCSK9i intolerance. Seen most recently by [facility] on 08/13/20. Has been dealing with vertigo and occasional chest pain thought to be potentially related to her HTN in the setting of HFpEF. Home BP 503-546 systolic. She was started on spironolactone 12.76m daily.   Pt presented to ED 7/17 with vertigo, dizziness and home BP 190/110. CT and MRI didn't show any abnormalities, no evidence of PRES, exam consistent with peripheral vertigo and also felt to have UTI, started on Keflex. BP was 142/74 in ED, BMET stable on spironolactone. Pt presents today for follow up.  Pt presents today in good spirits. Reports tolerating her spironolactone well but that the tablet is hard to split. Reports today is the best she's felt in the past few weeks. Has still had dizziness/unsteadiness on her feet for the past few weeks. Unrelated to BP which have been ~~568systolic at home. Reports she had been scheduled with PCP but had to cancel because she felt too dizzy that day to drive. Reports medications are expensive, about $150/month.  Unable to use pulse ox - fingernails ~3 inches in length.  Current HTN meds: diltiazem 3051mdaily (lunch), furosemide 4064maily (AM), lisinopril 47m53mily (PM), Toprol 100mg67mly (PM), spironolactone 12.5mg d68my (AM), Imdur 60mg B62mreviously tried: amlodipine (hallucinations), lisinopril (nose bleeds per pt) BP goal: <130/80mmHg 19mily History: Diabetes in her maternal  grandfather and mother; Emphysema in her paternal aunt and paternal uncle; Esophageal cancer (age of onset: 32) in h22 brother; Heart attack in her father; Heart disease in her maternal grandfather; Heart failure in her maternal grandfather; Rheum arthritis in her sister; Stomach cancer in her father.   Social History: Former smoker 1/2 PPD for 35 years, quit age 33  Diet56Doesn't add salt to food. 1 cup of coffee in the morning. Cooks most food at home. Avoids canned foods.  Exercise: Not much  Wt Readings from Last 3 Encounters:  08/13/20 194 lb (88 kg)  05/14/20 195 lb (88.5 kg)  04/28/20 195 lb (88.5 kg)   BP Readings from Last 3 Encounters:  08/25/20 116/68  08/13/20 140/62  05/14/20 134/60   Pulse Readings from Last 3 Encounters:  08/25/20 89  08/13/20 68  05/14/20 64    Renal function: CrCl cannot be calculated (Unknown ideal weight.).  Past Medical History:  Diagnosis Date   Adenomatous colon polyp    Allergy    Anxiety    Asthma    Carotid artery disease (HCC)    Talentotid US 1/201[place] bilat ICA 1-39%   Chronic diastolic CHF    Echo 02/2017: 1/275170, Gr 2 DD, mild MS (mean 5), PASP 44   COPD    pt is unsure if has been officially diagnosed   Coronary artery disease  CABG '09- cathed 12/09, 9/10, 6/11, 3/14 and 12/13/16- medical Rx // cath 12/2016 - 2/3 grafts patent >> med Rx // Myoview 12/17: low risk   Diabetes mellitus    Gastroesophageal reflux disease    Hiatal hernia    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   Hyperlipidemia    Hypertension    PONV (postoperative nausea and vomiting)    Schatzki's ring    Shoulder injury    resolved after shoulder surgery   Sleep apnea    not on cpap    Current Outpatient Medications on File Prior to Visit  Medication Sig Dispense Refill   albuterol (PROVENTIL HFA;VENTOLIN HFA) 108 (90 BASE) MCG/ACT inhaler Inhale 2 puffs into the lungs every 6 (six) hours as needed. For shortness of breath. 1 Inhaler 0    albuterol (PROVENTIL) (2.5 MG/3ML) 0.083% nebulizer solution USE 1 VIAL IN NEBULIZER 4 TIMES DAILY (Patient taking differently: Take 2.5 mg by nebulization 4 (four) times daily.) 360 mL 1   alum & mag hydroxide-simeth (MAALOX/MYLANTA) 200-200-20 MG/5ML suspension Take 30 mLs as needed by mouth for indigestion or heartburn.     aspirin EC 81 MG tablet Take 1 tablet (81 mg total) by mouth daily.     baclofen (LIORESAL) 20 MG tablet Take 2 tablets (40 mg total) by mouth 2 (two) times daily. Due for f/u appt. 360 tablet 2   Bempedoic Acid (NEXLETOL) 180 MG TABS Take 180 mg by mouth daily. 90 tablet 3   Blood Glucose Monitoring Suppl (ONE TOUCH ULTRA MINI) w/Device KIT 3 (three) times daily. for testing  0   calcium carbonate (OS-CAL) 1250 (500 Ca) MG chewable tablet Chew 1 tablet by mouth as needed for heartburn.     calcium carbonate (TUMS - DOSED IN MG ELEMENTAL CALCIUM) 500 MG chewable tablet Chew 2 tablets daily as needed by mouth for indigestion or heartburn.     cephALEXin (KEFLEX) 500 MG capsule Take 1 capsule (500 mg total) by mouth 4 (four) times daily. 20 capsule 0   Cholecalciferol (VITAMIN D) 2000 UNITS tablet Take 2,000 Units by mouth daily.     clonazePAM (KLONOPIN) 0.5 MG tablet Take 0.5 mg daily by mouth.     clopidogrel (PLAVIX) 75 MG tablet Take 75 mg by mouth daily.     Continuous Blood Gluc Sensor (FREESTYLE LIBRE 14 DAY SENSOR) MISC See admin instructions.     Cyanocobalamin (VITAMIN B-12 IJ) Inject 1,000 mcg as directed every 30 (thirty) days.     cyclobenzaprine (FLEXERIL) 5 MG tablet Take 5 mg by mouth as needed for muscle spasms.     diclofenac sodium (VOLTAREN) 1 % GEL Apply 2 g topically 4 (four) times daily as needed (muscle pain).     diltiazem (CARDIZEM CD) 240 MG 24 hr capsule TAKE ONE CAPSULE BY MOUTH EVERY DAY (Patient not taking: No sig reported) 180 capsule 0   diltiazem (CARDIZEM CD) 300 MG 24 hr capsule Take 1 capsule (300 mg total) by mouth daily. 90 capsule 3    ezetimibe (ZETIA) 10 MG tablet Take 1 tablet (10 mg total) by mouth daily. 90 tablet 3   famotidine (PEPCID) 40 MG tablet Take 1 tablet (40 mg total) by mouth daily as needed. (Patient taking differently: Take 40 mg by mouth daily as needed for heartburn or indigestion.) 90 tablet 1   fexofenadine (ALLEGRA) 180 MG tablet TAKE 1 TABLET BY MOUTH EVERY DAY (Patient taking differently: Take 180 mg by mouth as  needed for allergies.) 90 tablet 3   Fluticasone-Salmeterol (ADVAIR DISKUS) 250-50 MCG/DOSE AEPB Inhale 1 puff into the lungs 2 (two) times daily. (Patient taking differently: Inhale 1 puff into the lungs as needed (shortness of breath).) 60 each 5   Fluticasone-Umeclidin-Vilant (TRELEGY ELLIPTA) 200-62.5-25 MCG/INH AEPB Inhale 1 puff into the lungs daily. (Patient not taking: No sig reported) 14 each 0   furosemide (LASIX) 40 MG tablet Take 1 tablet (40 mg total) by mouth daily. 90 tablet 3   insulin lispro protamine-insulin lispro (HUMALOG 75/25) (75-25) 100 UNIT/ML SUSP Inject 15-30 Units into the skin See admin instructions. Inject 15 units in the morning, 5 units at lunch, and 30 units at dinner.     isosorbide mononitrate (IMDUR) 60 MG 24 hr tablet Take one tablet ( 60 mg ) twice daily 8 hours apart. (Patient taking differently: Take 60 mg by mouth 2 (two) times daily. Take 8 hours apart.) 180 tablet 3   lisinopril (ZESTRIL) 20 MG tablet Take 20 mg by mouth daily.     loperamide (IMODIUM) 2 MG capsule Take 2 mg by mouth as needed for diarrhea or loose stools.      meclizine (ANTIVERT) 25 MG tablet TAKE ONE TABLET BY MOUTH TWICE DAILY AS NEEDED FOR DIZZINESS (Patient taking differently: Take 25 mg by mouth as needed for dizziness.) 30 tablet 0   metoprolol succinate (TOPROL-XL) 100 MG 24 hr tablet TAKE ONE TABLET BY MOUTH EVERY DAY (Patient taking differently: Take 100 mg by mouth daily.) 90 tablet 3   Multiple Vitamin (MULTIVITAMIN WITH MINERALS) TABS Take 1 tablet by mouth daily.     Nebulizers  (COMPRESSOR/NEBULIZER) MISC Use with albuterol 1 each 0   nitroGLYCERIN (NITROSTAT) 0.4 MG SL tablet Place 0.4 mg under the tongue every 5 (five) minutes as needed for chest pain.     nortriptyline (PAMELOR) 50 MG capsule TAKE 2 CAPSULES (100 MG TOTAL) BY MOUTH AT BEDTIME. 180 capsule 1   ONE TOUCH ULTRA TEST test strip 1 each by Other route 2 (two) times daily.  3   ONETOUCH DELICA LANCETS 95G MISC 3 (three) times daily. for testing  0   RABEprazole (ACIPHEX) 20 MG tablet TAKE ONE TABLET BY MOUTH EVERY DAY (Patient not taking: Reported on 08/25/2020) 60 tablet 0   Spacer/Aero-Holding Chambers (AEROCHAMBER MV) inhaler Use as instructed 1 each 0   spironolactone (ALDACTONE) 25 MG tablet Take 0.5 tablets (12.5 mg total) by mouth daily. 15 tablet 11   Study - ORION 4 - inclisiran 300 mg/1.33m or placebo SQ injection (PI-Stuckey) Inject 300 mg into the skin every 6 (six) months.     Tiotropium Bromide Monohydrate (SPIRIVA RESPIMAT) 2.5 MCG/ACT AERS Inhale 2 puffs into the lungs daily. 4 g 6   Current Facility-Administered Medications on File Prior to Visit  Medication Dose Route Frequency Provider Last Rate Last Admin   cyanocobalamin ((VITAMIN B-12)) injection 1,000 mcg  1,000 mcg Intramuscular Q30 days BBinnie Rail MD   1,000 mcg at 03/26/16 1658    Allergies  Allergen Reactions   Amlodipine Other (See Comments)    hallucinations    Banana Nausea And Vomiting    Stomach pumped   Co Q10 [Coenzyme Q10] Other (See Comments)    Body cramps   Codeine Other (See Comments)    Hallucinate, loose identity and don't know who I am   Metformin And Related Nausea And Vomiting   Morphine Other (See Comments)    Can not function, it immobilizes me  Pentazocine Nausea And Vomiting   Pravastatin Other (See Comments)    Hands locked up   Repatha [Evolocumab] Other (See Comments)    myalgias   Statins Other (See Comments)    Muscle cramps   Sulfa Antibiotics Swelling   Sulfonamide Derivatives  Swelling   Tramadol Hcl Other (See Comments)     Assessment/Plan:  1. Hypertension - BP improved and close to goal <130/17mHg, home readings remain elevated with SBP in the 140s. Will increase spironolactone from 12.564mto 2518maily (pt reports tablets are hard to cut in half as well). Med copays are expensive for pt so EntDelene Lollll not likely be an affordable option for her (mentioned at last visit due her diastolic CHF). Will continue all other medications for her BP. F/u in 3-4 weeks for BP check and BMET, advised pt to bring home BP cuff and log to next visit. Encouraged her to r/s follow up appt with PCP, may benefit from ENT referral for vertigo workup.  2. Hyperlipidemia - Pt taking ezetimibe and Nexletol, will change to combo Nexlizet tablet. PA approved. [place] for lipid meds, confirmed copay is $0 at her pharmacy. She remains enrolled in the ORICenterfieldial for inclisiran as well. Previously intolerant to atorvastatin 33m51mily, rosuvastatin, and Repatha. Most recent LDL 85 above goal < 70 due to CABG (could also target more aggressive goal < 55 per AACE/ACE guidelines due to her DM as well), although all meds are currently optimized in the setting of her prior intolerances.  [name], PharmD, BCACP, CPP [id number] [id number]: [phone]: [phone]/2022 2:58 PM

## 2020-08-28 NOTE — Patient Instructions (Addendum)
It was nice to see you today!  Your blood pressure goal is < 130/11mmHg  Increase your spironolactone from 1/2 tablet to 1 full tablet (25mg ) once daily  When you use up the rest of your ezetimibe (Zetia) and Nexletol prescription, replace it with a combination pill Nexlizet - 1 tablet each day  Continue to monitor your blood pressure at home  Bring in a log of your readings and your blood pressure cuff to your next visit in 3-4 weeks  Reschedule your visit with your primary care doctor - a referral to ENT may be helpful to determine the cause of your vertigo

## 2020-08-28 NOTE — Telephone Encounter (Signed)
Post ED Visit - Positive Culture Follow-up  Culture report reviewed by antimicrobial stewardship pharmacist: [facility] Team []  Elenor Quinones, Pharm.D. []  Heide Guile, Pharm.D., BCPS AQ-ID []  Parks Neptune, Pharm.D., BCPS []  Alycia Rossetti, Pharm.D., BCPS []  [place], [place].D., BCPS, AAHIVP []  Legrand Como, Pharm.D., BCPS, AAHIVP [x]  Salome Arnt, PharmD, BCPS []  Johnnette Gourd, PharmD, BCPS []  Hughes Better, PharmD, BCPS []  Leeroy Cha, PharmD []  Laqueta Linden, PharmD, BCPS []  Albertina Parr, PharmD  [place] Team []  Leodis Sias, PharmD []  Lindell Spar, PharmD []  Royetta Asal, PharmD []  Graylin Shiver, Rph []  Rema Fendt) Glennon Mac, PharmD []  Arlyn Dunning, PharmD []  Netta Cedars, PharmD []  Dia Sitter, PharmD []  Leone Haven, PharmD []  Gretta Arab, PharmD []  Theodis Shove, PharmD []  Peggyann Juba, PharmD []  Reuel Boom, PharmD   Positive urine culture Treated with Cephalexin, organism sensitive to the same and no further patient follow-up is required at this time.  Harlon Flor [facility] 08/28/2020, 11:10 AM

## 2020-09-05 ENCOUNTER — Telehealth: Payer: Self-pay

## 2020-09-05 NOTE — Chronic Care Management (AMB) (Signed)
Chronic Care Management Pharmacy Assistant   Name: [name]  MRN: [id number] DOB: [birth year]  [name] is an 80 y.o. year old female who presents for his initial CCM visit with the clinical pharmacist.  Recent office visits:  05/08/20-Stacy Lorretta Harp, MD (PCP) Seen for Vertigo. Increase meclizine 25 mg to TID. Trial of valium 5 mg in morning only. Referral was ordered for Physical Therapy.         Recent consult visits:  08/28/20-[name] E Supple (Cardiology) General follow up. Increase spironolactone from 12.68m to 240mdaily. When you use up the rest of your ezetimibe (Zetia) and Nexletol prescription, replace it with a combination pill Nexlizet - 1 tablet each day. 08/13/20-Scott T. We[place] ServicePA-C (Cardiology) Follow up. add Spironolactone 12.5 mg once daily.  Labs ordered. [facility] Follow up in 8 weeks. 05/14/20-Scott T Joylene DraftPA-C (Cardiology) Follow up.  Recommended adjusting her isosorbide to 60 mg twice a day instead of once daily. Labs ordered.Follow up in 3 months. Hospital visits:  Medication Reconciliation was completed by comparing discharge summary, patient's EMR and Pharmacy list, and upon discussion with patient.  Admitted to the hospital on 08/24/20 due to Vertigo. Discharge date was 08/25/20. [facility] -[facility] Keflex 500 mg due to UTI  Medication Changes at Hospital Discharge: -Changed none noted  Medications Discontinued at Hospital Discharge: -Stopped none noted  Medications that remain the same after Hospital Discharge:??  -All other medications will remain the same.    Medications: Outpatient Encounter Medications as of 09/05/2020  Medication Sig   albuterol (PROVENTIL HFA;VENTOLIN HFA) 108 (90 BASE) MCG/ACT inhaler Inhale 2 puffs into the lungs every 6 (six) hours as needed. For shortness of breath.   albuterol (PROVENTIL) (2.5 MG/3ML)  0.083% nebulizer solution USE 1 VIAL IN NEBULIZER 4 TIMES DAILY (Patient taking differently: Take 2.5 mg by nebulization 4 (four) times daily.)   alum & mag hydroxide-simeth (MAALOX/MYLANTA) 200-200-20 MG/5ML suspension Take 30 mLs as needed by mouth for indigestion or heartburn.   aspirin EC 81 MG tablet Take 1 tablet (81 mg total) by mouth daily.   baclofen (LIORESAL) 20 MG tablet Take 2 tablets (40 mg total) by mouth 2 (two) times daily. Due for f/u appt.   Bempedoic Acid-Ezetimibe (NEXLIZET) 180-10 MG TABS Take 1 tablet by mouth daily.   Blood Glucose Monitoring Suppl (ONE TOUCH ULTRA MINI) w/Device KIT 3 (three) times daily. for testing   calcium carbonate (OS-CAL) 1250 (500 Ca) MG chewable tablet Chew 1 tablet by mouth as needed for heartburn.   calcium carbonate (TUMS - DOSED IN MG ELEMENTAL CALCIUM) 500 MG chewable tablet Chew 2 tablets daily as needed by mouth for indigestion or heartburn.   cephALEXin (KEFLEX) 500 MG capsule Take 1 capsule (500 mg total) by mouth 4 (four) times daily.   Cholecalciferol (VITAMIN D) 2000 UNITS tablet Take 2,000 Units by mouth daily.   clonazePAM (KLONOPIN) 0.5 MG tablet Take 0.5 mg daily by mouth.   clopidogrel (PLAVIX) 75 MG tablet Take 75 mg by mouth daily.   Continuous Blood Gluc Sensor (FREESTYLE LIBRE 14 DAY SENSOR) MISC See admin instructions.   Cyanocobalamin (VITAMIN B-12 IJ) Inject 1,000 mcg as directed every 30 (thirty) days.   cyclobenzaprine (FLEXERIL) 5 MG tablet Take 5 mg by mouth as needed for muscle spasms.   diclofenac sodium (VOLTAREN) 1 % GEL Apply 2 g topically 4 (four) times daily  as needed (muscle pain).   diltiazem (CARDIZEM CD) 300 MG 24 hr capsule Take 1 capsule (300 mg total) by mouth daily.   famotidine (PEPCID) 40 MG tablet Take 1 tablet (40 mg total) by mouth daily as needed. (Patient taking differently: Take 40 mg by mouth daily as needed for heartburn or indigestion.)   fexofenadine (ALLEGRA) 180 MG tablet TAKE 1 TABLET BY  MOUTH EVERY DAY (Patient taking differently: Take 180 mg by mouth as needed for allergies.)   Fluticasone-Salmeterol (ADVAIR DISKUS) 250-50 MCG/DOSE AEPB Inhale 1 puff into the lungs 2 (two) times daily. (Patient taking differently: Inhale 1 puff into the lungs as needed (shortness of breath).)   Fluticasone-Umeclidin-Vilant (TRELEGY ELLIPTA) 200-62.5-25 MCG/INH AEPB Inhale 1 puff into the lungs daily.   furosemide (LASIX) 40 MG tablet Take 1 tablet (40 mg total) by mouth daily.   insulin lispro protamine-insulin lispro (HUMALOG 75/25) (75-25) 100 UNIT/ML SUSP Inject 15-30 Units into the skin See admin instructions. Inject 15 units in the morning, 5 units at lunch, and 30 units at dinner.   isosorbide mononitrate (IMDUR) 60 MG 24 hr tablet Take one tablet ( 60 mg ) twice daily 8 hours apart. (Patient taking differently: Take 60 mg by mouth 2 (two) times daily. Take 8 hours apart.)   lisinopril (ZESTRIL) 20 MG tablet Take 20 mg by mouth daily.   loperamide (IMODIUM) 2 MG capsule Take 2 mg by mouth as needed for diarrhea or loose stools.    meclizine (ANTIVERT) 25 MG tablet TAKE ONE TABLET BY MOUTH TWICE DAILY AS NEEDED FOR DIZZINESS (Patient taking differently: Take 25 mg by mouth as needed for dizziness.)   metoprolol succinate (TOPROL-XL) 100 MG 24 hr tablet TAKE ONE TABLET BY MOUTH EVERY DAY (Patient taking differently: Take 100 mg by mouth daily.)   Multiple Vitamin (MULTIVITAMIN WITH MINERALS) TABS Take 1 tablet by mouth daily.   Nebulizers (COMPRESSOR/NEBULIZER) MISC Use with albuterol   nitroGLYCERIN (NITROSTAT) 0.4 MG SL tablet Place 0.4 mg under the tongue every 5 (five) minutes as needed for chest pain.   nortriptyline (PAMELOR) 50 MG capsule TAKE 2 CAPSULES (100 MG TOTAL) BY MOUTH AT BEDTIME.   ONE TOUCH ULTRA TEST test strip 1 each by Other route 2 (two) times daily.   ONETOUCH DELICA LANCETS 41O MISC 3 (three) times daily. for testing   Spacer/Aero-Holding Chambers (AEROCHAMBER MV)  inhaler Use as instructed   spironolactone (ALDACTONE) 25 MG tablet Take 1 tablet (25 mg total) by mouth daily.   Study - ORION 4 - inclisiran 300 mg/1.59m or placebo SQ injection (PI-Stuckey) Inject 300 mg into the skin every 6 (six) months.   Tiotropium Bromide Monohydrate (SPIRIVA RESPIMAT) 2.5 MCG/ACT AERS Inhale 2 puffs into the lungs daily.   Facility-Administered Encounter Medications as of 09/05/2020  Medication   cyanocobalamin ((VITAMIN B-12)) injection 1,000 mcg   Albuterol (PROVENTIL HFA;VENTOLIN HFA) 108 (90 BASE) MCG/ACT inhaler Last filled:None noted Albuterol (PROVENTIL) (2.5 MG/3ML) 0.083% nebulizer solution Last filled:10/26/17 15 DS Alum & mag hydroxide-simeth (MAALOX/MYLANTA) 200-200-20 MG/5ML suspension Last filled:None noted Aspirin EC 81 MG tablet Last filled:None noted Baclofen (LIORESAL) 20 MG tablet Last filled:08/14/20 90 DS Bempedoic Acid-Ezetimibe (NEXLIZET) 180-10 MG TABS Last filled:08/29/20 90 DS Calcium carbonate (OS-CAL) 1250 (500 Ca) MG chewable tablet Last filled:None noted Calcium carbonate (TUMS - DOSED IN MG ELEMENTAL CALCIUM) 500 MG chewable tablet Last filled:None noted CephALEXin (KEFLEX) 500 MG capsule Last filled:08/25/20 5 DS Cholecalciferol (VITAMIN D) 2000 UNITS tablet Last filled:None noted ClonazePAM (KLONOPIN) 0.5  MG tablet Last filled:04/17/20 30 DS Clopidogrel (PLAVIX) 75 MG tablet Last filled:09/04/20 90 DS Cyanocobalamin (VITAMIN B-12 IJ) Last filled:None noted Cyclobenzaprine (FLEXERIL) 5 MG tablet Last filled:None noted Diclofenac sodium (VOLTAREN) 1 % GEL Last filled:None noted Diltiazem (CARDIZEM CD) 300 MG 24 hr capsule Last filled:09/04/20 90 DS Famotidine (PEPCID) 40 MG tablet Last filled:01/19/18 90 DS Fexofenadine (ALLEGRA) 180 MG tablet Last filled:07/28/20 30 DS Fluticasone-Salmeterol (ADVAIR DISKUS) 250-50 MCG/DOSE AEPB Last filled:03/20/18 90 DS Fluticasone-Umeclidin-Vilant (TRELEGY ELLIPTA) 200-62.5-25 MCG/INH AEPB Last  filled:06/18/19 30 DS Furosemide (LASIX) 40 MG tablet Last filled:03/20/18 90 DS Insulin lispro protamine-insulin lispro (HUMALOG 75/25) (75-25) 100 UNIT/ML SUSP Last filled:07/15/20 30 DS Isosorbide mononitrate (IMDUR) 60 MG 24 hr tablet Last filled:07/28/20 90 DS Lisinopril (ZESTRIL) 20 MG tablet Last filled:None noted Loperamide (IMODIUM) 2 MG capsule Last filled:None noted Meclizine (ANTIVERT) 25 MG tablet Last filled:06/16/20 50 DS Metoprolol succinate (TOPROL-XL) 100 MG 24 hr tablet Last filled:07/07/20 90 DS Multiple Vitamin (MULTIVITAMIN WITH MINERALS) TABS Last filled:None noted NitroGLYCERIN (NITROSTAT) 0.4 MG SL tablet Last filled:None noted Nortriptyline (PAMELOR) 50 MG capsule Last filled:07/29/20 90 DS Spironolactone (ALDACTONE) 25 MG tablet Last filled:08/13/20 90 DS Study - ORION 4 - inclisiran 300 mg/1.47m or placebo SQ injection (PI-Stuckey) Last filled:None noted Tiotropium Bromide Monohydrate (SPIRIVA RESPIMAT) 2.5 MCG/ACT AERS Last filled:08/02/18 30 DS   Star Rating Drugs: Lisinopril (ZESTRIL) 20 MG tablet Last filled:None noted   MCorrie Mckusick [place]

## 2020-09-08 NOTE — Progress Notes (Signed)
Subjective:    Patient ID: [name], female    DOB: 1940-11-23, 80 y.o.   MRN: [id number]  HPI The patient is here for follow up of vertigo   She was here 04/2020 for vertigo and elevated BP  Has chronic vertigo w/ intermittent flares.  Referred for PT.  Taking meclizine tid prn and valium 5 mg in am prn.   Went to ED 7/17 - Had recent brain CT and MRI - no acute abnormality. EKG ok. Covid neg.  Trop 7.  Possible uti - possible flared vertigo  She completed the antibiotic ( keflex) and she still has dysuria.    BP at home 164/93, variable at home.  Some days it is really good and other days it is high.  She does have an appointment with the cardiology pharmacist in 2 weeks.  She did not bring her blood pressure log today.  Vertigo - daily but not as bad.    Medications and allergies reviewed with patient and updated if appropriate.  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   Angina pectoris ([place]) 01/05/2017   Insomnia 11/23/2016   Chronic nonintractable headache 11/23/2016   Pancreatic insufficiency 10/28/2016   Carotid artery disease ([place]) 05/19/2016   Fatigue 05/19/2016   Anemia 03/27/2016   Type 1 diabetes mellitus ([place]) 07/21/2015   B12 deficiency 03/31/2015   OSA (obstructive sleep apnea) with hypoxia 11/20/2014   Fecal incontinence 08/28/2014   Neurologic gait dysfunction 08/28/2014   Falls 08/28/2014   Neck pain of over 3 months duration 08/28/2014   H/O total knee replacement 02/20/2014   Obesity (BMI 30-39.9) 11/12/2013   Chronic diastolic CHF (congestive heart failure) ([place]) 11/17/2012   Meniscus, lateral, anterior horn derangement 11/10/2011   Medial meniscus, posterior horn derangement 11/10/2011   Osteoarthritis of right knee 11/10/2011   Vertigo 10/05/2011   Labile hypertension 10/05/2011   CAD  (coronary artery disease) 08/27/2010   MUSCLE CRAMPS 12/29/2009   History of myocardial infarction 11/19/2009   Extrinsic asthma 11/19/2009   GERD 11/19/2009   Cough 11/19/2009   Hyperlipidemia 11/18/2009    Current Outpatient Medications on File Prior to Visit  Medication Sig Dispense Refill   albuterol (PROVENTIL HFA;VENTOLIN HFA) 108 (90 BASE) MCG/ACT inhaler Inhale 2 puffs into the lungs every 6 (six) hours as needed. For shortness of breath. 1 Inhaler 0   albuterol (PROVENTIL) (2.5 MG/3ML) 0.083% nebulizer solution USE 1 VIAL IN NEBULIZER 4 TIMES DAILY (Patient taking differently: Take 2.5 mg by nebulization 4 (four) times daily.) 360 mL 1   alum & mag hydroxide-simeth (MAALOX/MYLANTA) 200-200-20 MG/5ML suspension Take 30 mLs as needed by mouth for indigestion or heartburn.     aspirin EC 81 MG tablet Take 1 tablet (81 mg total) by mouth daily.     baclofen (LIORESAL) 20 MG tablet Take 2 tablets (40 mg total) by mouth 2 (two) times daily. Due for f/u appt. 360 tablet 2   Bempedoic Acid-Ezetimibe (NEXLIZET) 180-10 MG TABS Take 1 tablet by mouth daily. 90 tablet 3   Blood Glucose Monitoring Suppl (ONE TOUCH ULTRA MINI) w/Device KIT 3 (three) times daily. for testing  0   calcium carbonate (OS-CAL) 1250 (500 Ca) MG chewable tablet Chew 1 tablet by mouth as needed for heartburn.     calcium carbonate (  TUMS - DOSED IN MG ELEMENTAL CALCIUM) 500 MG chewable tablet Chew 2 tablets daily as needed by mouth for indigestion or heartburn.     Cholecalciferol (VITAMIN D) 2000 UNITS tablet Take 2,000 Units by mouth daily.     clonazePAM (KLONOPIN) 0.5 MG tablet Take 0.5 mg daily by mouth.     clopidogrel (PLAVIX) 75 MG tablet Take 75 mg by mouth daily.     Continuous Blood Gluc Sensor (FREESTYLE LIBRE 14 DAY SENSOR) MISC See admin instructions.     Cyanocobalamin (VITAMIN B-12 IJ) Inject 1,000 mcg as directed every 30 (thirty) days.     cyclobenzaprine (FLEXERIL) 5 MG tablet Take 5 mg by mouth as  needed for muscle spasms.     diclofenac sodium (VOLTAREN) 1 % GEL Apply 2 g topically 4 (four) times daily as needed (muscle pain).     diltiazem (CARDIZEM CD) 300 MG 24 hr capsule Take 1 capsule (300 mg total) by mouth daily. 90 capsule 3   famotidine (PEPCID) 40 MG tablet Take 1 tablet (40 mg total) by mouth daily as needed. (Patient taking differently: Take 40 mg by mouth daily as needed for heartburn or indigestion.) 90 tablet 1   fexofenadine (ALLEGRA) 180 MG tablet TAKE 1 TABLET BY MOUTH EVERY DAY (Patient taking differently: Take 180 mg by mouth as needed for allergies.) 90 tablet 3   Fluticasone-Salmeterol (ADVAIR DISKUS) 250-50 MCG/DOSE AEPB Inhale 1 puff into the lungs 2 (two) times daily. (Patient taking differently: Inhale 1 puff into the lungs as needed (shortness of breath).) 60 each 5   Fluticasone-Umeclidin-Vilant (TRELEGY ELLIPTA) 200-62.5-25 MCG/INH AEPB Inhale 1 puff into the lungs daily. 14 each 0   furosemide (LASIX) 40 MG tablet Take 1 tablet (40 mg total) by mouth daily. 90 tablet 3   insulin lispro protamine-insulin lispro (HUMALOG 75/25) (75-25) 100 UNIT/ML SUSP Inject 15-30 Units into the skin See admin instructions. Inject 15 units in the morning, 5 units at lunch, and 30 units at dinner.     isosorbide mononitrate (IMDUR) 60 MG 24 hr tablet Take one tablet ( 60 mg ) twice daily 8 hours apart. (Patient taking differently: Take 60 mg by mouth 2 (two) times daily. Take 8 hours apart.) 180 tablet 3   lisinopril (ZESTRIL) 20 MG tablet Take 20 mg by mouth daily.     loperamide (IMODIUM) 2 MG capsule Take 2 mg by mouth as needed for diarrhea or loose stools.      meclizine (ANTIVERT) 25 MG tablet TAKE ONE TABLET BY MOUTH TWICE DAILY AS NEEDED FOR DIZZINESS (Patient taking differently: Take 25 mg by mouth as needed for dizziness.) 30 tablet 0   metoprolol succinate (TOPROL-XL) 100 MG 24 hr tablet TAKE ONE TABLET BY MOUTH EVERY DAY (Patient taking differently: Take 100 mg by mouth  daily.) 90 tablet 3   Multiple Vitamin (MULTIVITAMIN WITH MINERALS) TABS Take 1 tablet by mouth daily.     Nebulizers (COMPRESSOR/NEBULIZER) MISC Use with albuterol 1 each 0   nitroGLYCERIN (NITROSTAT) 0.4 MG SL tablet Place 0.4 mg under the tongue every 5 (five) minutes as needed for chest pain.     nortriptyline (PAMELOR) 50 MG capsule TAKE 2 CAPSULES (100 MG TOTAL) BY MOUTH AT BEDTIME. 180 capsule 1   ONE TOUCH ULTRA TEST test strip 1 each by Other route 2 (two) times daily.  3   ONETOUCH DELICA LANCETS 61P MISC 3 (three) times daily. for testing  0   Spacer/Aero-Holding Chambers (AEROCHAMBER MV) inhaler Use as  instructed 1 each 0   spironolactone (ALDACTONE) 25 MG tablet Take 1 tablet (25 mg total) by mouth daily. 30 tablet 11   Study - ORION 4 - inclisiran 300 mg/1.22m or placebo SQ injection (PI-Stuckey) Inject 300 mg into the skin every 6 (six) months.     Tiotropium Bromide Monohydrate (SPIRIVA RESPIMAT) 2.5 MCG/ACT AERS Inhale 2 puffs into the lungs daily. 4 g 6   Current Facility-Administered Medications on File Prior to Visit  Medication Dose Route Frequency Provider Last Rate Last Admin   cyanocobalamin ((VITAMIN B-12)) injection 1,000 mcg  1,000 mcg Intramuscular Q30 days BBinnie Rail MD   1,000 mcg at 03/26/16 1658    Past Medical History:  Diagnosis Date   Adenomatous colon polyp    Allergy    Anxiety    Asthma    Carotid artery disease ([place]    carotid [place]: bilat ICA 1-39%   Chronic diastolic CHF    Echo 11/2407 EF 65-70, Gr 2 DD, mild MS (mean 5), PASP 44   COPD    pt is unsure if has been officially diagnosed   Coronary artery disease    CABG '09- cathed 12/09, 9/10, 6/11, 3/14 and 12/13/16- medical Rx // cath 12/2016 - 2/3 grafts patent >> med Rx // Myoview 12/17: low risk   Diabetes mellitus    Gastroesophageal reflux disease    Hiatal hernia    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   Hyperlipidemia    Hypertension    PONV (postoperative nausea  and vomiting)    Schatzki's ring    Shoulder injury    resolved after shoulder surgery   Sleep apnea    not on cpap    Past Surgical History:  Procedure Laterality Date   ABDOMINAL HYSTERECTOMY     APPENDECTOMY     came out with Hysterectomy   CARDIAC CATHETERIZATION  07/23/2009   EF 60%   CARDIAC CATHETERIZATION  10/11/2008   CARDIAC CATHETERIZATION  03/01/2007   EF 75-80%   CARDIOVASCULAR STRESS TEST  11/15/2007   EF 60%   COLONOSCOPY     CORONARY ARTERY BYPASS GRAFT     SEVERELY DISEASED SAPHENOUS VEIN GRAFT TO THE RIGHT CORONARY ARTERY BUT WITH FAIRLY WELL PRESERVED FLOW TO THE DISTAL RIGHT CORONARY ARTERY FROM THE NATIVE CIRCULATION-RESTART  CATH IN JUNE 2000, REVEALS MILD/MODERATE  CAD WITH GOOD FLOW DOWN HER LAD   ESOPHAGOGASTRODUODENOSCOPY     EYE SURGERY     bilateral cataract surgery with lens implant   LEFT HEART CATH AND CORS/GRAFTS ANGIOGRAPHY N/A 12/14/2016   Procedure: LEFT HEART CATH AND CORS/GRAFTS ANGIOGRAPHY;  Surgeon: VJettie Booze MD;  [facility];  Service: Cardiovascular;  Laterality: N/A;   lense removal Left    POLYPECTOMY     ROTATOR CUFF REPAIR     right and left   TONSILLECTOMY     age 80  TOTAL KNEE ARTHROPLASTY Right 02/20/2014   Procedure: RIGHT TOTAL KNEE ARTHROPLASTY;  Surgeon: RTobi Bastos MD;  Location: [facility];  Service: Orthopedics;  Laterality: Right;   tumor removed kidney     UPPER GASTROINTESTINAL ENDOSCOPY     [place]AP[place]  03/08/2008   EF 55-60%    Social History   Socioeconomic History   Marital status: Widowed    Spouse name: Not on file   Number of children: 4   Years of education: Doctorate   Highest education level: Doctorate  Occupational History   Occupation: Retired  Tobacco Use   Smoking status: Former    Packs/day: 0.50    Years: 21.00    Pack years: 10.50    Types: Cigarettes    Start date: 02/09/1955    Quit date: 02/09/1976    Years since quitting: 44.6   Smokeless tobacco:  Never  Vaping Use   Vaping Use: Never used  Substance and Sexual Activity   Alcohol use: No    Alcohol/week: 0.0 standard drinks   Drug use: No   Sexual activity: Never  Other Topics Concern   Not on file  Social History Narrative   Lives alone.  One story home.  Has 4 children.  Education: doctorate in theology.    Caffeine use: Drinks 1 cup coffee/day      Originally from [place]. Previously has lived in [place]. Prior travel to [place], [place], [place], [place], [place], [place], [place], & [place]. Previously worked in [employer] She has a dog currently. No bird, mold, or hot tub exposure. She also pastors a church.    Social Determinants of Health   Financial Resource Strain: Not on file  Food Insecurity: Not on file  Transportation Needs: Not on file  Physical Activity: Not on file  Stress: Not on file  Social Connections: Not on file    Family History  Problem Relation Age of Onset   Heart disease Maternal Grandfather    Heart failure Maternal Grandfather    Diabetes Maternal Grandfather    Heart attack Father    Diabetes Mother    Rheum arthritis Sister    Emphysema Paternal Uncle    Esophageal cancer Brother 11       she said he was born with it   Emphysema Paternal Aunt    Healthy Child    Neuropathy Neg Hx    Multiple sclerosis Neg Hx    Colon cancer Neg Hx    Colon polyps Neg Hx    Rectal cancer Neg Hx    Stomach cancer Neg Hx     Review of Systems  Constitutional:  Positive for diaphoresis. Negative for chills and fever.  Respiratory:  Positive for wheezing (intermittent). Negative for cough and shortness of breath.   Cardiovascular:  Negative for chest pain (pressure only), palpitations and leg swelling.  Gastrointestinal:  Positive for abdominal pain (from gerd).       Jerrye Bushy is daily  Genitourinary:  Positive for dysuria. Negative for frequency and hematuria.  Neurological:  Positive for dizziness and headaches.      Objective:   Vitals:   09/09/20  1432  BP: 128/72  Pulse: 64  Temp: 98.6 F (37 C)  SpO2: 95%   BP Readings from Last 3 Encounters:  09/09/20 128/72  08/28/20 132/84  08/25/20 116/68   Wt Readings from Last 3 Encounters:  09/09/20 192 lb 6.4 oz (87.3 kg)  08/13/20 194 lb (88 kg)  05/14/20 195 lb (88.5 kg)   Body mass index is 35.19 kg/m.   Physical Exam    Constitutional: Appears well-developed and well-nourished. No distress.  HENT:  Head: Normocephalic and atraumatic.  Neck: Neck supple. No tracheal deviation present. No thyromegaly present.  No cervical lymphadenopathy Cardiovascular: Normal rate, regular rhythm and normal heart sounds.   No murmur heard. No carotid bruit .  No edema Pulmonary/Chest: Effort normal and breath sounds normal. No respiratory distress. No has no wheezes. No rales.  Skin: Skin is warm and dry. Not diaphoretic.  Psychiatric: Normal mood and affect. Behavior is normal.      Assessment & Plan:    See Problem List for Assessment and Plan of chronic medical problems.    This visit occurred during the SARS-CoV-2 public health emergency.  Safety protocols were in place, including screening questions prior to the visit, additional usage of staff PPE, and extensive cleaning of exam room while observing appropriate contact time as indicated for disinfecting solutions.

## 2020-09-09 ENCOUNTER — Encounter: Payer: Self-pay | Admitting: Internal Medicine

## 2020-09-09 ENCOUNTER — Ambulatory Visit (INDEPENDENT_AMBULATORY_CARE_PROVIDER_SITE_OTHER): Payer: Medicare Other | Admitting: Internal Medicine

## 2020-09-09 ENCOUNTER — Other Ambulatory Visit: Payer: Self-pay

## 2020-09-09 VITALS — BP 128/72 | HR 64 | Temp 98.6°F | Ht 62.0 in | Wt 192.4 lb

## 2020-09-09 DIAGNOSIS — F419 Anxiety disorder, unspecified: Secondary | ICD-10-CM | POA: Diagnosis not present

## 2020-09-09 DIAGNOSIS — R42 Dizziness and giddiness: Secondary | ICD-10-CM

## 2020-09-09 DIAGNOSIS — R300 Dysuria: Secondary | ICD-10-CM

## 2020-09-09 DIAGNOSIS — R0989 Other specified symptoms and signs involving the circulatory and respiratory systems: Secondary | ICD-10-CM | POA: Diagnosis not present

## 2020-09-09 MED ORDER — DIAZEPAM 5 MG PO TABS: 5.0000 mg | ORAL_TABLET | Freq: Every day | ORAL | 5 refills | Status: DC

## 2020-09-09 NOTE — Assessment & Plan Note (Signed)
Chronic, intermittent She has had intermittent vertigo for years When she went to the ED she did have a whole work-up including brain CT and MRI, EKG-all normal She has been taking meclizine and diazepam and that has been helping Continue meclizine 25 mg 3 times daily as needed Continue diazepam 5 mg every morning, which will help with her vertigo and her anxiety Will refer to outpatient PT

## 2020-09-09 NOTE — Assessment & Plan Note (Addendum)
Chronic Blood pressure very good here today, but variable and has not been ideally controlled She is taking her BP at home, but did not bring her BP log She states she is taking all of her medication, but is not aware of all of her medications Blood pressure is very good here today and I am not going to make any changes today She is seeing the cardiology pharmacist in 2 weeks-I advised her to bring her BP cuff, BP log and all of her medications with her to that visit Stressed low-sodium diet

## 2020-09-09 NOTE — Patient Instructions (Addendum)
  Go to the lab downstairs.     Medications changes include :  stop clonazepam.  Start diazepam 5 mg daily in the morning.     Your prescription(s) have been submitted to your pharmacy. Please take as directed and contact our office if you believe you are having problem(s) with the medication(s).   A referral was ordered for PT       Someone from their office will call you to schedule an appointment.    Please followup in 6 months

## 2020-09-09 NOTE — Assessment & Plan Note (Signed)
Subacute She was diagnosed with a UTI when she went to the emergency room 7/17 and completed her cephalexin Still having dysuria Will check urinalysis, urine culture and prescribe an additional antibiotic.  There is still an infection Increase fluids

## 2020-09-09 NOTE — Assessment & Plan Note (Signed)
Chronic Has been taking clonazepam, but we will discontinue this Continue diazepam 5 mg-we will take daily every morning for vertigo and anxiety

## 2020-09-10 LAB — URINALYSIS, ROUTINE W REFLEX MICROSCOPIC
Bilirubin Urine: NEGATIVE
Hgb urine dipstick: NEGATIVE
Ketones, ur: NEGATIVE
Nitrite: NEGATIVE
Specific Gravity, Urine: 1.015 (ref 1.000–1.030)
Total Protein, Urine: NEGATIVE
Urine Glucose: NEGATIVE
Urobilinogen, UA: 0.2 (ref 0.0–1.0)
pH: 6 (ref 5.0–8.0)

## 2020-09-11 LAB — URINE CULTURE

## 2020-09-16 ENCOUNTER — Telehealth: Payer: Self-pay | Admitting: Internal Medicine

## 2020-09-16 NOTE — Telephone Encounter (Signed)
LVM for pt to rtn my call at [phone] to schedule AWV with [facility]. Please schedule this appt if pt calls the office or comes in.

## 2020-09-17 ENCOUNTER — Ambulatory Visit: Payer: Medicare Other

## 2020-09-17 NOTE — Progress Notes (Deleted)
Chronic Care Management Pharmacy Note  09/17/2020 Name:  [name] MRN:  [id number] DOB:  1940/10/21  Summary: ***  Recommendations/Changes made from today's visit: ***  Plan: ***  Subjective: [name] is an 80 y.o. year old female who is a primary patient of Burns, Claudina Lick, MD.  The CCM team was consulted for assistance with disease management and care coordination needs.    Engaged with patient face to face for initial visit in response to provider referral for pharmacy case management and/or care coordination services.   Consent to Services:  The patient was given the following information about Chronic Care Management services today, agreed to services, and gave verbal consent: 1. CCM service includes personalized support from designated clinical staff supervised by the primary care provider, including individualized plan of care and coordination with other care providers 2. 24/7 contact phone numbers for assistance for urgent and routine care needs. 3. Service will only be billed when office clinical staff spend 20 minutes or more in a month to coordinate care. 4. Only one practitioner may furnish and bill the service in a calendar month. 5.The patient may stop CCM services at any time (effective at the end of the month) by phone call to the office staff. 6. The patient will be responsible for cost sharing (co-pay) of up to 20% of the service fee (after annual deductible is met). Patient agreed to services and consent obtained.  Patient Care Team: Binnie Rail, MD as PCP - General (Internal Medicine) Nahser, Wonda Cheng, MD as PCP - Cardiology (Cardiology) Elsie Stain, MD as Attending Physician (Pulmonary Disease) Ladene Artist, MD as Consulting Physician (Gastroenterology) Javier Glazier, MD as Consulting Physician (Pulmonary Disease) Pieter Partridge, DO as Consulting Physician (Neurology) Sharmon Revere as Physician Assistant (Cardiology) Tomasa Blase, [facility] as Pharmacist (Pharmacist)  Recent office visits:  09/09/2020 - Binnie Rail, MD (PCP) - seen for dysuria, vertigo, anxiety - stop clonazepam, continue diazepam and meclizine, urine culture ordered for dysuria  05/08/20-Stacy Lorretta Harp, MD (PCP) Seen for Vertigo. Increase meclizine 25 mg to TID. Trial of valium 5 mg in morning only. Referral was ordered for Physical Therapy.          Recent consult visits:  08/28/20-[name] E Supple (Cardiology) General follow up. Increase spironolactone from 12.51m to 241mdaily. When you use up the rest of your ezetimibe (Zetia) and Nexletol prescription, replace it with a combination pill Nexlizet - 1 tablet each day. 08/13/20-Scott T. WeBoston ServicePA-C (Cardiology) Follow up. add Spironolactone 12.5 mg once daily.  Labs ordered. [facility] Follow up in 8 weeks. 05/14/20-Scott T Joylene DraftPA-C (Cardiology) Follow up.  Recommended adjusting her isosorbide to 60 mg twice a day instead of once daily. Labs ordered.Follow up in 3 months. Hospital visits:  Medication Reconciliation was completed by comparing discharge summary, patient's EMR and Pharmacy list, and upon discussion with patient.   Admitted to the hospital on 08/24/20 due to Vertigo. Discharge date was 08/25/20. [facility] -started Keflex 500 mg due to UTI   Medication Changes at Hospital Discharge: -Changed none noted   Medications Discontinued at Hospital Discharge: -Stopped none noted   Medications that remain the same after Hospital Discharge:?? -All other medications will remain the same.    Objective:  Lab Results  Component Value Date   CREATININE 0.78 08/24/2020   BUN  16 08/24/2020   GFR 57.76 (L) 12/19/2019   GFRNONAA >60 08/24/2020   GFRAA 83 10/30/2019   NA 139 08/24/2020   K 4.0 08/24/2020   CALCIUM 9.4 08/24/2020   CO2 29 08/24/2020   GLUCOSE 179 (H)  08/24/2020    Lab Results  Component Value Date/Time   HGBA1C 8.1 (H) 12/19/2019 02:37 PM   HGBA1C 9.2 (H) 02/12/2019 02:39 PM   GFR 57.76 (L) 12/19/2019 02:37 PM   GFR 70.12 02/12/2019 02:39 PM   MICROALBUR 2.0 10/30/2019 03:54 PM   MICROALBUR 1.0 05/19/2016 11:52 AM    Last diabetic Eye exam:  Lab Results  Component Value Date/Time   HMDIABEYEEXA No Retinopathy 02/06/2019 12:00 AM    Last diabetic Foot exam:  No results found for: HMDIABFOOTEX   Lab Results  Component Value Date   CHOL 139 06/25/2020   HDL 30 (L) 06/25/2020   LDLCALC 85 06/25/2020   TRIG 132 06/25/2020   CHOLHDL 4.6 (H) 06/25/2020    Hepatic Function Latest Ref Rng & Units 12/19/2019 10/30/2019 02/12/2019  Total Protein 6.0 - 8.3 g/dL 7.4 7.5 7.3  Albumin 3.5 - 5.2 g/dL 4.1 - 3.9  AST 0 - 37 U/L '14 14 12  ' ALT 0 - 35 U/L '9 9 9  ' Alk Phosphatase 39 - 117 U/L 121(H) - 133(H)  Total Bilirubin 0.2 - 1.2 mg/dL 0.3 0.2 0.2  Bilirubin, Direct 0.00 - 0.40 mg/dL - - -    Lab Results  Component Value Date/Time   TSH 0.76 02/12/2019 02:39 PM   TSH 1.25 05/19/2016 11:52 AM    CBC Latest Ref Rng & Units 08/24/2020 12/19/2019 10/30/2019  WBC 4.0 - 10.5 K/uL 7.4 6.6 7.5  Hemoglobin 12.0 - 15.0 g/dL 13.2 12.1 11.3(L)  Hematocrit 36.0 - 46.0 % 41.6 37.4 37.1  Platelets 150 - 400 K/uL 316 285.0 290    Lab Results  Component Value Date/Time   VD25OH 27.21 (L) 12/19/2019 02:37 PM    Clinical ASCVD: {YES/NO:21197} The ASCVD Risk score Mikey Bussing DC Jr., et al., 2013) failed to calculate for the following reasons:   The 2013 ASCVD risk score is only valid for ages 91 to 103    Depression screen PHQ 2/9 10/30/2019 08/01/2018 01/24/2018  Decreased Interest 0 0 0  Down, Depressed, Hopeless 0 0 1  PHQ - 2 Score 0 0 1  Altered sleeping - - 2  Tired, decreased energy - - 1  Change in appetite - - 0  Feeling bad or failure about yourself  - - 0  Trouble concentrating - - 0  Moving slowly or fidgety/restless - - 0   Suicidal thoughts - - 0  PHQ-9 Score - - 4  Difficult doing work/chores - - Not difficult at all  Some recent data might be hidden     ***Other: (CHADS2VASc if Afib, MMRC or CAT for COPD, ACT, DEXA)  Social History   Tobacco Use  Smoking Status Former   Packs/day: 0.50   Years: 21.00   Pack years: 10.50   Types: Cigarettes   Start date: 02/09/1955   Quit date: 02/09/1976   Years since quitting: 44.6  Smokeless Tobacco Never   BP Readings from Last 3 Encounters:  09/09/20 128/72  08/28/20 132/84  08/25/20 116/68   Pulse Readings from Last 3 Encounters:  09/09/20 64  08/25/20 89  08/13/20 68   Wt Readings from Last 3 Encounters:  09/09/20 192 lb 6.4 oz (87.3 kg)  08/13/20 194 lb (88 kg)  05/14/20 195 lb (88.5 kg)   BMI Readings from Last 3 Encounters:  09/09/20 35.19 kg/m  08/13/20 35.48 kg/m  05/14/20 35.67 kg/m    Assessment/Interventions: Review of patient past medical history, allergies, medications, health status, including review of consultants reports, laboratory and other test data, was performed as part of comprehensive evaluation and provision of chronic care management services.   SDOH:  (Social Determinants of Health) assessments and interventions performed: {yes/no:20286}  SDOH Screenings   Alcohol Screen: Not on file  Depression (PHQ2-9): Low Risk    PHQ-2 Score: 0  Financial Resource Strain: Not on file  Food Insecurity: Not on file  Housing: Not on file  Physical Activity: Not on file  Social Connections: Not on file  Stress: Not on file  Tobacco Use: Medium Risk   Smoking Tobacco Use: Former   Smokeless Tobacco Use: Never  Transportation Needs: Not on file    [place]  Allergies  Allergen Reactions   Amlodipine Other (See Comments)    hallucinations    Banana Nausea And Vomiting    Stomach pumped   Co Q10 [Coenzyme Q10] Other (See Comments)    Body cramps   Codeine Other (See Comments)    Hallucinate, loose identity and  don't know who I am   Metformin And Related Nausea And Vomiting   Morphine Other (See Comments)    Can not function, it immobilizes me    Pentazocine Nausea And Vomiting   Pravastatin Other (See Comments)    Hands locked up   Repatha [Evolocumab] Other (See Comments)    myalgias   Statins Other (See Comments)    Muscle cramps   Sulfa Antibiotics Swelling   Sulfonamide Derivatives Swelling   Tramadol Hcl Other (See Comments)    Medications Reviewed Today     Reviewed by Earnstine Regal, RMA (Registered Medical Assistant) on 09/09/20 at 1434  Med List Status: <None>   Medication Order Taking? Sig Documenting Provider Last Dose Status Informant  albuterol (PROVENTIL HFA;VENTOLIN HFA) 108 (90 BASE) MCG/ACT inhaler [id number] Yes Inhale 2 puffs into the lungs every 6 (six) hours as needed. For shortness of breath. Elsie Stain, MD Taking Active Self  albuterol (PROVENTIL) (2.5 MG/3ML) 0.083% nebulizer solution [id number] Yes USE 1 VIAL IN NEBULIZER 4 TIMES DAILY  Patient taking differently: Take 2.5 mg by nebulization 4 (four) times daily.   Marshell Garfinkel, MD Taking Active   alum & mag hydroxide-simeth (MAALOX/MYLANTA) 200-200-20 MG/5ML suspension [id number] Yes Take 30 mLs as needed by mouth for indigestion or heartburn. [provider] Taking Active Self  aspirin EC 81 MG tablet [id number] Yes Take 1 tablet (81 mg total) by mouth daily. Richardson Dopp T, PA-C Taking Active Self  baclofen (LIORESAL) 20 MG tablet [id number] Yes Take 2 tablets (40 mg total) by mouth 2 (two) times daily. Due for f/u appt. Binnie Rail, MD Taking Active Self  Bempedoic Acid-Ezetimibe (NEXLIZET) 180-10 MG TABS [id number] Yes Take 1 tablet by mouth daily. Richardson Dopp T, PA-C Taking Active   Blood Glucose Monitoring Suppl (ONE TOUCH ULTRA MINI) w/Device KIT [id number] Yes 3 (three) times daily. for testing [provider] Taking Active Self           Med Note (CAULFIELD, ASHLEY L   Wed Jun 04, 2015  1:32 PM)    calcium carbonate (OS-CAL) 1250 (500 Ca) MG chewable tablet [id number] Yes Chew 1 tablet by mouth as needed for heartburn. [provider] Taking Active Self  calcium carbonate (TUMS - DOSED IN MG ELEMENTAL CALCIUM) 500 MG chewable tablet [id number] Yes Chew 2 tablets daily as needed by mouth for indigestion or heartburn. [provider] Taking Active Self  Cholecalciferol (VITAMIN D) 2000 UNITS tablet [id number] Yes Take 2,000 Units by mouth daily. [provider] Taking Active Self  clonazePAM (KLONOPIN) 0.5 MG tablet [id number] Yes Take 0.5 mg daily by mouth. [provider] Taking Active Self  clopidogrel (PLAVIX) 75 MG tablet [id number] Yes Take 75 mg by mouth daily. [provider] Taking Active Self  Continuous Blood Gluc Sensor (FREESTYLE LIBRE [place]) [place] [id number] Yes See admin instructions. [provider] Taking Active Self  cyanocobalamin ((VITAMIN B-12)) injection 1,000 mcg [id number]   Binnie Rail, MD  Active   Cyanocobalamin (VITAMIN B-12 IJ) [id number] Yes Inject 1,000 mcg as directed every 30 (thirty) days. [provider] Taking Active Self  cyclobenzaprine (FLEXERIL) 5 MG tablet [id number] Yes Take 5 mg by mouth as needed for muscle spasms. [provider] Taking Active Self  diclofenac sodium (VOLTAREN) 1 % GEL [id number] Yes Apply 2 g topically 4 (four) times daily as needed (muscle pain). [provider] Taking Active Self  diltiazem (CARDIZEM CD) 300 MG 24 hr capsule [id number] Yes Take 1 capsule (300 mg total) by mouth daily. Nahser, Wonda Cheng, MD Taking Active Self  famotidine (PEPCID) 40 MG tablet [id number] Yes Take 1 tablet (40 mg total) by mouth daily as needed.  Patient taking differently: Take 40 mg by mouth daily as needed for heartburn or indigestion.   Binnie Rail, MD Taking Active   fexofenadine [facility]) 180 MG tablet [id number] Yes TAKE 1 TABLET BY MOUTH EVERY DAY   Patient taking differently: Take 180 mg by mouth as needed for allergies.   Marshell Garfinkel, MD Taking Active   Fluticasone-Salmeterol (ADVAIR DISKUS) 250-50 MCG/DOSE AEPB [id number] Yes Inhale 1 puff into the lungs 2 (two) times daily.  Patient taking differently: Inhale 1 puff into the lungs as needed (shortness of breath).   Fenton Foy, NP Taking Active   Fluticasone-Umeclidin-Vilant (TRELEGY ELLIPTA) 200-62.5-25 MCG/INH AEPB [id number] Yes Inhale 1 puff into the lungs daily. Marshell Garfinkel, MD Taking Active Self  furosemide (LASIX) 40 MG tablet [id number] Yes Take 1 tablet (40 mg total) by mouth daily. Leanor Kail, PA Taking Active Self  insulin lispro protamine-insulin lispro (HUMALOG 75/25) (75-25) 100 UNIT/ML SUSP [id number] Yes Inject 15-30 Units into the skin See admin instructions. Inject 15 units in the morning, 5 units at lunch, and 30 units at dinner. [provider] Taking Active Self           Med Note Luana Shu, NATASHA   Tue Mar 15, 2017  1:43 PM)    isosorbide mononitrate (IMDUR) 60 MG 24 hr tablet [id number] Yes Take one tablet ( 60 mg ) twice daily 8 hours apart.  Patient taking differently: Take 60 mg by mouth 2 (two) times daily. Take 8 hours apart.   Richardson Dopp T, PA-C Taking Active   lisinopril (ZESTRIL) 20 MG tablet [id number] Yes Take 20 mg by mouth daily. [provider] Taking Active Self  loperamide (IMODIUM) 2 MG capsule [id number] Yes Take 2 mg by mouth as needed for diarrhea or loose stools.  [provider] Taking Active Self  meclizine (ANTIVERT) 25 MG tablet [id number] Yes TAKE ONE TABLET BY MOUTH TWICE DAILY AS NEEDED FOR DIZZINESS  Patient taking differently: Take 25 mg by mouth as needed for dizziness.  Binnie Rail, MD Taking Active   metoprolol succinate (TOPROL-XL) 100 MG 24 hr tablet [id number] Yes TAKE ONE TABLET BY MOUTH EVERY DAY  Patient taking differently: Take 100 mg by mouth daily.   Nahser, Wonda Cheng, MD Taking  Active   Multiple Vitamin (MULTIVITAMIN WITH MINERALS) TABS [id number] Yes Take 1 tablet by mouth daily. [provider] Taking Active Self  Nebulizers (COMPRESSOR/NEBULIZER) MISC [id number] Yes Use with albuterol Elsie Stain, MD Taking Active Self  nitroGLYCERIN (NITROSTAT) 0.4 MG SL tablet [id number] Yes Place 0.4 mg under the tongue every 5 (five) minutes as needed for chest pain. [provider] Taking Active Self  nortriptyline (PAMELOR) 50 MG capsule [id number] Yes TAKE 2 CAPSULES (100 MG TOTAL) BY MOUTH AT BEDTIME. Binnie Rail, MD Taking Active Self  ONE TOUCH ULTRA TEST test strip [id number] Yes 1 each by Other route 2 (two) times daily. [provider] Taking Active Self           Med Note (CAULFIELD, ASHLEY L   Wed Jun 04, 2015  1:32 PM)    [facility] LANCETS [address] [place] [id number] Yes 3 (three) times daily. for testing [provider] Taking Active Self           Med Note (CAULFIELD, ASHLEY L   Wed Jun 04, 2015  1:32 PM)    Spacer/Aero-Holding Josiah Lobo (AEROCHAMBER MV) inhaler [id number] Yes Use as instructed Javier Glazier, MD Taking Active Self  spironolactone (ALDACTONE) 25 MG tablet [id number] Yes Take 1 tablet (25 mg total) by mouth daily. Richardson Dopp T, PA-C Taking Active   Study - ORION 4 - inclisiran 300 mg/1.8m or placebo SQ injection (PI-[place]uc[place]) [id number] Inject 300 mg into the skin every 6 (six) months. [provider] Taking Active Self  Tiotropium Bromide Monohydrate (SPIRIVA RESPIMAT) 2.5 MCG/ACT AERS [id number] Inhale 2 puffs into the lungs daily. 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   Angina pectoris ([place] 01/05/2017   Insomnia 11/23/2016   Chronic  nonintractable headache 11/23/2016   Pancreatic insufficiency 10/28/2016   Carotid artery disease ([place] 05/19/2016   Fatigue 05/19/2016   Anemia 03/27/2016   Type 1 diabetes mellitus ([place] 07/21/2015   B12 deficiency 03/31/2015   OSA (obstructive sleep apnea) with hypoxia 11/20/2014   Fecal incontinence 08/28/2014   Neurologic gait dysfunction 08/28/2014   Falls 08/28/2014   Neck pain of over 3 months duration 08/28/2014   H/O total knee replacement 02/20/2014   Obesity (BMI 30-39.9) 11/12/2013   Chronic diastolic CHF (congestive heart failure) ([place] 11/17/2012   Meniscus, lateral, anterior horn derangement 11/10/2011   Medial meniscus, posterior horn derangement 11/10/2011   Osteoarthritis of right knee 11/10/2011   Vertigo 10/05/2011   Labile hypertension 10/05/2011   CAD (coronary artery disease) 08/27/2010   MUSCLE CRAMPS 12/29/2009   History of myocardial infarction 11/19/2009   Extrinsic asthma 11/19/2009   GERD 11/19/2009   Cough 11/19/2009   Hyperlipidemia 11/18/2009    Immunization History  Administered Date(s) Administered   Fluad Quad(high Dose 65+) 09/29/2018   Influenza Split 12/15/2011, 11/08/2012, 11/19/2014   Influenza Whole 10/09/2008   Influenza, High Dose Seasonal PF 11/19/2015,  11/23/2016, 10/26/2017   Influenza-Unspecified 10/29/2013   PFIZER(Purple Top)SARS-COV-2 Vaccination 04/05/2019, 05/01/2019   Pneumococcal Conjugate-13 09/03/2015   Pneumococcal Polysaccharide-23 02/09/2004   Pneumococcal-Unspecified 02/09/2012    Conditions to be addressed/monitored:  {USCCMDZASSESSMENTOPTIONS:23563}  There are no care plans that you recently modified to display for this patient.     Medication Assistance: {MEDASSISTANCEINFO:25044}  Compliance/Adherence/Medication fill history: Care Gaps: ***  Patient's preferred pharmacy is:  [id number] [place], [place]Phone: [phone]: [phone] [address] [place] [address]Phone: [phone]: [address]Phone: [phone]: [phone]  Uses pill box? {Yes or If no, why not?:20788} Pt endorses ***% compliance  Care Plan and Follow Up Patient Decision:  {FOLLOWUP:24991}  Plan: {CM FOLLOW UP PJJHE:17408} ***  Current Barriers:  {pharmacybarriers:24917}  Pharmacist Clinical Goal(s):  Patient will {PHARMACYGOALCHOICES:24921} through collaboration with PharmD and provider.   Interventions: 1:1 collaboration with BBinnie Rail MD regarding development and update of comprehensive plan of care as evidenced by provider attestation and co-signature Inter-disciplinary care team collaboration (see longitudinal plan of care) Comprehensive medication review performed; medication list updated in electronic medical record  Hyperlipidemia / CAD : (LDL goal < 70) -{US controlled/uncontrolled:25276} Lab Results  Component Value Date   LDLCALC 85 06/25/2020  -Current treatment: Bempedoic Acid-Ezetimibe (Nexlizet) 180-16m- 1 tablet dialy  Inclisiran 30038m.5 mL - 300m1mjected every 6 months (ORION 4 study) Aspirin 81mg83m tablet daily  Clopidogrel 75mg 49mtablet daily  Nitroglycerin 0.4mg - 18mablet every 5 minutes as needed for chest pain -Medications previously tried: atorvastatin, rosuvastatin, repatha, pravastatin, colesevelam  -Current dietary patterns: *** -Current exercise habits: *** -Educated on {CCM HLD Counseling:25126} -{CCMPHARMDINTERVENTION:25122}  Diabetes (A1c goal <7%) -{US controlled/uncontrolled:25276} - Follows with Dr. Balan LChalmers Catersults  Component Value Date   HGBA1C 8.1 (H) 12/19/2019  -Current  medications: Humalog 75/25 mix - 15 units in the morning, 5 units with lunch, and 30 units with dinner -Medications previously tried: invokana, metofrmin, januvia  -Current home glucose readings fasting glucose: *** post prandial glucose: *** -{ACTIONS;DENIES/REPORTS:21021675} hypoglycemic/hyperglycemic symptoms -Current meal patterns:  breakfast: ***  lunch: ***  dinner: *** snacks: *** drinks: *** -Current exercise: *** -Educated on {CCM DM COUNSELING:25123} -Counseled to check feet daily and get yearly eye exams -{CCMPHARMDINTERVENTION:25122}  Heart Failure (Goal: manage symptoms and prevent exacerbations) / Hypertension (BP goal <140/90) -{US controlled/uncontrolled:25276} -Last ejection fraction: 65-70% (Date: 02/2017) -HF type: Diastolic -NYHA Class: IIb-III -Current treatment: Lisinopril 20mg - 65mblet daily  Diltiazem 300mg - 173msules daily  Isosorbide mononitrate 60mg - 1 34met daily  Metoprolol succinate 100mg - 1 t40mt daily  Spironolactone 25mg - 1 ta26m daily  Furosemide 40mg - 1 tab29mdaily - still taking? -Medications previously tried: amlodipine, doxazosin, losartan, ranolazine  -Current home BP/HR readings: *** -Current dietary habits: *** -Current exercise habits: *** -Educated on {CCM HF Counseling:25125} -{CCMPHARMDINTERVENTION:25122}  COPD / Asthma (Goal: control symptoms and prevent exacerbations) -{US controlled/uncontrolled:25276} -Current treatment  Trelegy ellipta 200-62.5-25mcg/act - 1 puff daily  Albuterol 108mcg/act HFA58m puffs every 6 hours as needed  Albuterol 0.083% nebulizer solution - 1 vial 4 times daily as needed  -Medications previously tried: qvar,  symbicort, breo ellipta, xopenex, perforomist, montelukast, Spiriva, advair  -Gold Grade: Gold 2 (FEV1 50-79%) -Current COPD Classification:  {CHL HP Upstream Pharm COPD Classification:[phone]} -MMRC/CAT score: *** -Pulmonary function testing: 12/23/2016 - FVC 1.44 L (72%) FEV1  0.96 L (63%) FEV1/FVC 0.67 FEF 25-75 0.55 L (42%) negative bronchodilator response TLC 3.85 L (78%) RV 102% DLCO uncorrected 45% -Exacerbations requiring treatment in last 6 months: *** -Patient {Actions; denies-reports:120008} consistent use of maintenance inhaler -Frequency of rescue inhaler use: *** -Counseled on {CCMINHALERCOUNSELING:25121} -{CCMPHARMDINTERVENTION:25122}  Vertigo (Goal: Control of symptoms) -{US controlled/uncontrolled:25276} -Current treatment  Diazepam 79m - 1 tablet daily  Meclizine 240m- 1 tablet twice daily as needed  -Medications previously tried: alprazolam    -{CCMPHARMDINTERVENTION:25122}  Chronic Pain (Goal: Pain control) -{US controlled/uncontrolled:25276} -Current treatment  Cyclobenazprine 62m18m 1 tablet daily as needed  Baclofen 83m38m2 tablets twice daily  Diclofenax 1% gel - 2-4 grams applied 4 times daily as needed  -Medications previously tried: alprazolam   -{CCMPHARMDINTERVENTION:25122}  GERD (Goal: control/ prevention of acid reflux) -{US controlled/uncontrolled:25276} -Current treatment  Famotidine 40mg662m tablet daily as needed  Aluminum and Magnesium hydroxide-simethicone - 30mL 62meeded  Tums 500mg t4mt - 2 tablets daily as needed  -Medications previously tried: lansoprazole, omeprazole, pantoprazole, rabeprazole, creon  -{CCMPHARMDINTERVENTION:25122}  Insomnia (Goal: Promotion of quality sleep) -{US controlled/uncontrolled:25276} -Current treatment  Nortriptyline 50mg - 6mpsules at bedtime  -Medications previously tried: belsomra, trazodone  -{CCMPHARMDINTERVENTION:25122}   Health Maintenance -Vaccine gaps: *** -Current therapy:  Multivitamin - 1 tablet daily  Vitmain D3 2000 units - 1 tablet daily Loperamide 2mg - 1 562mlet as needed  Aluminum and Magnesium hydroxide-simethicone - 30mL as n40md  Tums 500mg table82m2 tablets daily as needed  Fexofenadine 180mg - 1 ta67m daily as needed   -Educated on {ccm  supplement counseling:25128} -{CCM Patient satisfied:25129} -{CCMPHARMDINTERVENTION:25122}  Patient Goals/Self-Care Activities Patient will:  - {pharmacypatientgoals:24919}  Follow Up Plan: {CM FOLLOW UP PLAN:22241} YPZX:80638}hart prep = 38 minutes

## 2020-09-23 ENCOUNTER — Other Ambulatory Visit: Payer: Self-pay

## 2020-09-23 ENCOUNTER — Ambulatory Visit (INDEPENDENT_AMBULATORY_CARE_PROVIDER_SITE_OTHER): Payer: Medicare Other | Admitting: Pharmacist

## 2020-09-23 VITALS — BP 128/78

## 2020-09-23 DIAGNOSIS — R0989 Other specified symptoms and signs involving the circulatory and respiratory systems: Secondary | ICD-10-CM | POA: Diagnosis not present

## 2020-09-23 DIAGNOSIS — E782 Mixed hyperlipidemia: Secondary | ICD-10-CM

## 2020-09-23 MED ORDER — SPIRONOLACTONE 25 MG PO TABS: 25.0000 mg | ORAL_TABLET | Freq: Every day | ORAL | 3 refills | Status: DC

## 2020-09-23 NOTE — Patient Instructions (Signed)
It was nice to see you today, your blood pressure is excellent and at goal < 130/84mHg!  Call clinic with any blood pressure related concerns, or if your top blood pressure number is less than 100 #[phone]

## 2020-09-23 NOTE — Progress Notes (Signed)
Patient ID: [name]                 DOB: 1940-10-14                      MRN: [id number]     HPI: [name] is a 80 y.o. female patient of Dr Acie Fredrickson referred by Richardson Dopp, PA to HTN clinic. PMH is significant for CAD s/p CABG, HTN, HLD with statin intolerance, DM, chronic diastolic CHF with EF 14-97% 02/2017 and G2DD, GERD, OSA, COPD, asthma, and former tobacco abuse. Previously followed by PharmD in 2019 for HTN and lipids. She was enrolled in the ORION-4 trial investigating inclisiran given her statin and PCSK9i intolerance. Seen most recently by [facility] on 08/13/20. Has been dealing with vertigo and occasional chest pain thought to be potentially related to her HTN in the setting of HFpEF.   Pt presented to ED 7/17 with vertigo, dizziness and home BP 190/110. CT and MRI didn't show any abnormalities, no evidence of PRES, exam consistent with peripheral vertigo and also felt to have UTI, started on Keflex. BP was 142/74 in ED, BMET stable on spironolactone. At last visit with me, spironolactone was increased to 47m daily.  Pt presents today in good spirits. Reports tolerating full tablet of spironolactone well. Does deal with vertigo, she increased the dose of her meclizine to help. This did not worsen on higher dose of spironolactone. Also has chronic daily headaches which she's had for years. Excedrin helps a bit. Some exertional angina with household activities. Denies LE edema. Home BP decreased from 145/78 down to 117/70 yesterday and she reports responding well to spironolactone. Reports medications are expensive, about $150/month. Entresto/SGLT2i likely not an option (mentioned in prior visit due to diastolic CHF).  Unable to use pulse ox - fingernails ~3 inches in length.  Current HTN meds:  diltiazem 3056mdaily (lunch) furosemide 4073maily (AM) lisinopril 43m42mily (PM) Toprol 100mg42mly (PM) spironolactone 25mg 42my (AM) Imdur 60mg B52mPreviously tried: amlodipine  (hallucinations), lisinopril (nose bleeds per pt)  BP goal: <130/80mmHg 57mily History: Diabetes in her maternal grandfather and mother; Emphysema in her paternal aunt and paternal uncle; Esophageal cancer (age of onset: 32) in h67 brother; Heart attack in her father; Heart disease in her maternal grandfather; Heart failure in her maternal grandfather; Rheum arthritis in her sister; Stomach cancer in her father.   Social History: Former smoker 1/2 PPD for 35 years, quit age 11  Diet61Doesn't add salt to food. 1 cup of coffee in the morning. Cooks most food at home. Avoids canned foods.  Exercise: Not much  Wt Readings from Last 3 Encounters:  09/09/20 192 lb 6.4 oz (87.3 kg)  08/13/20 194 lb (88 kg)  05/14/20 195 lb (88.5 kg)   BP Readings from Last 3 Encounters:  09/09/20 128/72  08/28/20 132/84  08/25/20 116/68   Pulse Readings from Last 3 Encounters:  09/09/20 64  08/25/20 89  08/13/20 68    Renal function: CrCl cannot be calculated (Patient's most recent lab result is older than the maximum 21 days allowed.).  Past Medical History:  Diagnosis Date   Adenomatous colon polyp    Allergy    Anxiety    Asthma    Carotid artery disease (HCC)    Cushingotid US 1/201[place] bilat ICA 1-39%   Chronic diastolic CHF    Echo 02/2017: 0/263770, Gr 2 DD, mild MS (mean 5), PASP  59   COPD    pt is unsure if has been officially diagnosed   Coronary artery disease    CABG '09- cathed 12/09, 9/10, 6/11, 3/14 and 12/13/16- medical Rx // cath 12/2016 - 2/3 grafts patent >> med Rx // Myoview 12/17: low risk   Diabetes mellitus    Gastroesophageal reflux disease    Hiatal hernia    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   Hyperlipidemia    Hypertension    PONV (postoperative nausea and vomiting)    Schatzki's ring    Shoulder injury    resolved after shoulder surgery   Sleep apnea    not on cpap    Current Outpatient Medications on File Prior to Visit  Medication Sig Dispense  Refill   albuterol (PROVENTIL HFA;VENTOLIN HFA) 108 (90 BASE) MCG/ACT inhaler Inhale 2 puffs into the lungs every 6 (six) hours as needed. For shortness of breath. 1 Inhaler 0   albuterol (PROVENTIL) (2.5 MG/3ML) 0.083% nebulizer solution USE 1 VIAL IN NEBULIZER 4 TIMES DAILY (Patient taking differently: Take 2.5 mg by nebulization 4 (four) times daily.) 360 mL 1   alum & mag hydroxide-simeth (MAALOX/MYLANTA) 200-200-20 MG/5ML suspension Take 30 mLs as needed by mouth for indigestion or heartburn.     aspirin EC 81 MG tablet Take 1 tablet (81 mg total) by mouth daily.     baclofen (LIORESAL) 20 MG tablet Take 2 tablets (40 mg total) by mouth 2 (two) times daily. Due for f/u appt. 360 tablet 2   Bempedoic Acid-Ezetimibe (NEXLIZET) 180-10 MG TABS Take 1 tablet by mouth daily. 90 tablet 3   Blood Glucose Monitoring Suppl (ONE TOUCH ULTRA MINI) w/Device KIT 3 (three) times daily. for testing  0   calcium carbonate (OS-CAL) 1250 (500 Ca) MG chewable tablet Chew 1 tablet by mouth as needed for heartburn.     calcium carbonate (TUMS - DOSED IN MG ELEMENTAL CALCIUM) 500 MG chewable tablet Chew 2 tablets daily as needed by mouth for indigestion or heartburn.     Cholecalciferol (VITAMIN D) 2000 UNITS tablet Take 2,000 Units by mouth daily.     clopidogrel (PLAVIX) 75 MG tablet Take 75 mg by mouth daily.     Continuous Blood Gluc Sensor (FREESTYLE LIBRE 14 DAY SENSOR) MISC See admin instructions.     Cyanocobalamin (VITAMIN B-12 IJ) Inject 1,000 mcg as directed every 30 (thirty) days.     cyclobenzaprine (FLEXERIL) 5 MG tablet Take 5 mg by mouth as needed for muscle spasms.     diazepam (VALIUM) 5 MG tablet Take 1 tablet (5 mg total) by mouth daily. 30 tablet 5   diclofenac sodium (VOLTAREN) 1 % GEL Apply 2 g topically 4 (four) times daily as needed (muscle pain).     diltiazem (CARDIZEM CD) 300 MG 24 hr capsule Take 1 capsule (300 mg total) by mouth daily. 90 capsule 3   famotidine (PEPCID) 40 MG tablet  Take 1 tablet (40 mg total) by mouth daily as needed. (Patient taking differently: Take 40 mg by mouth daily as needed for heartburn or indigestion.) 90 tablet 1   fexofenadine (ALLEGRA) 180 MG tablet TAKE 1 TABLET BY MOUTH EVERY DAY (Patient taking differently: Take 180 mg by mouth as needed for allergies.) 90 tablet 3   Fluticasone-Salmeterol (ADVAIR DISKUS) 250-50 MCG/DOSE AEPB Inhale 1 puff into the lungs 2 (two) times daily. (Patient taking differently: Inhale 1 puff into the lungs as needed (shortness of breath).) 60 each  5   Fluticasone-Umeclidin-Vilant (TRELEGY ELLIPTA) 200-62.5-25 MCG/INH AEPB Inhale 1 puff into the lungs daily. 14 each 0   furosemide (LASIX) 40 MG tablet Take 1 tablet (40 mg total) by mouth daily. 90 tablet 3   insulin lispro protamine-insulin lispro (HUMALOG 75/25) (75-25) 100 UNIT/ML SUSP Inject 15-30 Units into the skin See admin instructions. Inject 15 units in the morning, 5 units at lunch, and 30 units at dinner.     isosorbide mononitrate (IMDUR) 60 MG 24 hr tablet Take one tablet ( 60 mg ) twice daily 8 hours apart. (Patient taking differently: Take 60 mg by mouth 2 (two) times daily. Take 8 hours apart.) 180 tablet 3   lisinopril (ZESTRIL) 20 MG tablet Take 20 mg by mouth daily.     loperamide (IMODIUM) 2 MG capsule Take 2 mg by mouth as needed for diarrhea or loose stools.      meclizine (ANTIVERT) 25 MG tablet TAKE ONE TABLET BY MOUTH TWICE DAILY AS NEEDED FOR DIZZINESS (Patient taking differently: Take 25 mg by mouth as needed for dizziness.) 30 tablet 0   metoprolol succinate (TOPROL-XL) 100 MG 24 hr tablet TAKE ONE TABLET BY MOUTH EVERY DAY (Patient taking differently: Take 100 mg by mouth daily.) 90 tablet 3   Multiple Vitamin (MULTIVITAMIN WITH MINERALS) TABS Take 1 tablet by mouth daily.     Nebulizers (COMPRESSOR/NEBULIZER) MISC Use with albuterol 1 each 0   nitroGLYCERIN (NITROSTAT) 0.4 MG SL tablet Place 0.4 mg under the tongue every 5 (five) minutes as  needed for chest pain.     nortriptyline (PAMELOR) 50 MG capsule TAKE 2 CAPSULES (100 MG TOTAL) BY MOUTH AT BEDTIME. 180 capsule 1   ONE TOUCH ULTRA TEST test strip 1 each by Other route 2 (two) times daily.  3   ONETOUCH DELICA LANCETS 15B MISC 3 (three) times daily. for testing  0   Spacer/Aero-Holding Chambers (AEROCHAMBER MV) inhaler Use as instructed 1 each 0   spironolactone (ALDACTONE) 25 MG tablet Take 1 tablet (25 mg total) by mouth daily. 30 tablet 11   Study - ORION 4 - inclisiran 300 mg/1.29m or placebo SQ injection (PI-Stuckey) Inject 300 mg into the skin every 6 (six) months.     Tiotropium Bromide Monohydrate (SPIRIVA RESPIMAT) 2.5 MCG/ACT AERS Inhale 2 puffs into the lungs daily. 4 g 6   Current Facility-Administered Medications on File Prior to Visit  Medication Dose Route Frequency Provider Last Rate Last Admin   cyanocobalamin ((VITAMIN B-12)) injection 1,000 mcg  1,000 mcg Intramuscular Q30 days BBinnie Rail MD   1,000 mcg at 03/26/16 1658    Allergies  Allergen Reactions   Amlodipine Other (See Comments)    hallucinations    Banana Nausea And Vomiting    Stomach pumped   Co Q10 [Coenzyme Q10] Other (See Comments)    Body cramps   Codeine Other (See Comments)    Hallucinate, loose identity and don't know who I am   Metformin And Related Nausea And Vomiting   Morphine Other (See Comments)    Can not function, it immobilizes me    Pentazocine Nausea And Vomiting   Pravastatin Other (See Comments)    Hands locked up   Repatha [Evolocumab] Other (See Comments)    myalgias   Statins Other (See Comments)    Muscle cramps   Sulfa Antibiotics Swelling   Sulfonamide Derivatives Swelling   Tramadol Hcl Other (See Comments)     Assessment/Plan:  1. Hypertension - BP improved  and now at goal <130/53mHg. Will continue current meds. Med copays are expensive for pt so Entresto/SGLT2i are not affordable options for her diastolic CHF. Checking BMET today on recent  dose increase of spironolactone. F/u with PharmD as needed.  2. Hyperlipidemia - Pt will continue on Nexlizet, also remains enrolled in the O[employer]ang[employer]reetrial for inclisiran. Previously intolerant to atorvastatin 469mdaily, rosuvastatin, and Repatha. Most recent LDL 85 above goal < 70 due to CABG (could also target more aggressive goal < 55 per AACE/ACE guidelines due to her DM as well), although all meds are currently optimized in the setting of her prior intolerances.  [name] E. Supple, PharmD, BCACP, [id number]. [id number] [address]hone: [phone]: [phone]/16/2022 1:07 PM

## 2020-09-24 LAB — BASIC METABOLIC PANEL
BUN/Creatinine Ratio: 20 (ref 12–28)
BUN: 18 mg/dL (ref 8–27)
CO2: 27 mmol/L (ref 20–29)
Calcium: 9.4 mg/dL (ref 8.7–10.3)
Chloride: 100 mmol/L (ref 96–106)
Creatinine, Ser: 0.88 mg/dL (ref 0.57–1.00)
Glucose: 246 mg/dL — ABNORMAL HIGH (ref 65–99)
Potassium: 4.7 mmol/L (ref 3.5–5.2)
Sodium: 142 mmol/L (ref 134–144)
eGFR: 66 mL/min/{1.73_m2} (ref >59)

## 2020-09-26 ENCOUNTER — Ambulatory Visit: Payer: Medicare Other

## 2020-09-29 ENCOUNTER — Ambulatory Visit (INDEPENDENT_AMBULATORY_CARE_PROVIDER_SITE_OTHER): Payer: Medicare Other

## 2020-09-29 ENCOUNTER — Other Ambulatory Visit: Payer: Self-pay | Admitting: Cardiovascular Disease

## 2020-09-29 ENCOUNTER — Other Ambulatory Visit: Payer: Self-pay

## 2020-09-29 ENCOUNTER — Other Ambulatory Visit: Payer: Self-pay | Admitting: Pulmonary Disease

## 2020-09-29 VITALS — BP 138/80 | Temp 98.5°F | Ht 62.0 in | Wt 189.4 lb

## 2020-09-29 DIAGNOSIS — J449 Chronic obstructive pulmonary disease, unspecified: Secondary | ICD-10-CM

## 2020-09-29 DIAGNOSIS — Z0000 Encounter for general adult medical examination without abnormal findings: Secondary | ICD-10-CM

## 2020-09-29 NOTE — Progress Notes (Signed)
Subjective:   [name] is a 80 y.o. female who presents for Medicare Annual (Subsequent) preventive examination.  Review of Systems     Cardiac Risk Factors include: advanced age (>66mn, >>59women);diabetes mellitus;dyslipidemia;family history of premature cardiovascular disease;hypertension;obesity (BMI >30kg/m2)     Objective:    Today's Vitals   09/29/20 1554  BP: 138/80  Temp: 98.5 F (36.9 C)  Weight: 189 lb 6.4 oz (85.9 kg)  Height: _0  (1.575 m)  PainSc: 0-No pain   Body mass index is 34.64 kg/m.  Advanced Directives 09/29/2020 08/24/2020 01/08/2019 12/21/2018 11/20/2018 11/06/2018 08/17/2018  Does Patient Have a Medical Advance Directive? Yes No _1   Type of Advance Directive Living will;Healthcare Power of [place] Living will;Healthcare Power of [place]  Does patient want to make changes to medical advance directive? No - Patient declined - No - Patient declined - No - Patient declined - No - Patient declined  Copy of [place] Chart? No - copy requested - No - copy requested - No - copy requested - No - copy requested  Would patient like information on creating a medical advance directive? - No - Patient declined - - - - -  Pre-existing out of facility DNR order (yellow form or pink MOST form) - - - - - - -    Current Medications (verified) Outpatient Encounter Medications as of 09/29/2020  Medication Sig   albuterol (PROVENTIL HFA;VENTOLIN HFA) 108 (90 BASE) MCG/ACT inhaler Inhale 2 puffs into the lungs every 6 (six) hours as needed. For shortness of breath.   albuterol (PROVENTIL) (2.5 MG/3ML) 0.083% nebulizer solution USE 1 VIAL IN NEBULIZER 4 TIMES DAILY (Patient taking differently: Take 2.5 mg by nebulization 4 (four) times daily.)   alum & mag  hydroxide-simeth (MAALOX/MYLANTA) 200-200-20 MG/5ML suspension Take 30 mLs as needed by mouth for indigestion or heartburn.   aspirin EC 81 MG tablet Take 1 tablet (81 mg total) by mouth daily.   baclofen (LIORESAL) 20 MG tablet Take 2 tablets (40 mg total) by mouth 2 (two) times daily. Due for f/u appt.   Bempedoic Acid-Ezetimibe (NEXLIZET) 180-10 MG TABS Take 1 tablet by mouth daily.   Blood Glucose Monitoring Suppl (ONE TOUCH ULTRA MINI) w/Device KIT 3 (three) times daily. for testing   calcium carbonate (OS-CAL) 1250 (500 Ca) MG chewable tablet Chew 1 tablet by mouth as needed for heartburn.   calcium carbonate (TUMS - DOSED IN MG ELEMENTAL CALCIUM) 500 MG chewable tablet Chew 2 tablets daily as needed by mouth for indigestion or heartburn.   Cholecalciferol (VITAMIN D) 2000 UNITS tablet Take 2,000 Units by mouth daily.   clopidogrel (PLAVIX) 75 MG tablet Take 75 mg by mouth daily.   Continuous Blood Gluc Sensor (FREESTYLE LIBRE 14 DAY SENSOR) MISC See admin instructions.   Cyanocobalamin (VITAMIN B-12 IJ) Inject 1,000 mcg as directed every 30 (thirty) days.   cyclobenzaprine (FLEXERIL) 5 MG tablet Take 5 mg by mouth as needed for muscle spasms.   diazepam (VALIUM) 5 MG tablet Take 1 tablet (5 mg total) by mouth daily.   diclofenac sodium (VOLTAREN) 1 % GEL Apply 2 g topically 4 (four) times daily as needed (muscle pain).   diltiazem (CARDIZEM CD) 300 MG 24 hr capsule Take 1 capsule (300 mg total) by mouth daily.   famotidine (PEPCID) 40 MG  tablet Take 1 tablet (40 mg total) by mouth daily as needed. (Patient taking differently: Take 40 mg by mouth daily as needed for heartburn or indigestion.)   fexofenadine (ALLEGRA) 180 MG tablet TAKE 1 TABLET BY MOUTH EVERY DAY (Patient taking differently: Take 180 mg by mouth as needed for allergies.)   Fluticasone-Salmeterol (ADVAIR DISKUS) 250-50 MCG/DOSE AEPB Inhale 1 puff into the lungs 2 (two) times daily. (Patient taking differently: Inhale 1 puff  into the lungs as needed (shortness of breath).)   Fluticasone-Umeclidin-Vilant (TRELEGY ELLIPTA) 200-62.5-25 MCG/INH AEPB Inhale 1 puff into the lungs daily.   furosemide (LASIX) 40 MG tablet Take 1 tablet (40 mg total) by mouth daily.   insulin lispro protamine-insulin lispro (HUMALOG 75/25) (75-25) 100 UNIT/ML SUSP Inject 15-30 Units into the skin See admin instructions. Inject 15 units in the morning, 5 units at lunch, and 30 units at dinner.   isosorbide mononitrate (IMDUR) 60 MG 24 hr tablet Take one tablet ( 60 mg ) twice daily 8 hours apart. (Patient taking differently: Take 60 mg by mouth 2 (two) times daily. Take 8 hours apart.)   lisinopril (ZESTRIL) 20 MG tablet Take 20 mg by mouth daily.   loperamide (IMODIUM) 2 MG capsule Take 2 mg by mouth as needed for diarrhea or loose stools.    meclizine (ANTIVERT) 25 MG tablet TAKE ONE TABLET BY MOUTH TWICE DAILY AS NEEDED FOR DIZZINESS (Patient taking differently: Take 25 mg by mouth as needed for dizziness.)   metoprolol succinate (TOPROL-XL) 100 MG 24 hr tablet TAKE ONE TABLET BY MOUTH EVERY DAY (Patient taking differently: Take 100 mg by mouth daily.)   Multiple Vitamin (MULTIVITAMIN WITH MINERALS) TABS Take 1 tablet by mouth daily.   Nebulizers (COMPRESSOR/NEBULIZER) MISC Use with albuterol   nitroGLYCERIN (NITROSTAT) 0.4 MG SL tablet Place 0.4 mg under the tongue every 5 (five) minutes as needed for chest pain.   nortriptyline (PAMELOR) 50 MG capsule TAKE 2 CAPSULES (100 MG TOTAL) BY MOUTH AT BEDTIME.   ONE TOUCH ULTRA TEST test strip 1 each by Other route 2 (two) times daily.   ONETOUCH DELICA LANCETS 42P MISC 3 (three) times daily. for testing   Spacer/Aero-Holding Chambers (AEROCHAMBER MV) inhaler Use as instructed   spironolactone (ALDACTONE) 25 MG tablet Take 1 tablet (25 mg total) by mouth daily.   Study - ORION 4 - inclisiran 300 mg/1.65m or placebo SQ injection (PI-Stuckey) Inject 300 mg into the skin every 6 (six) months.    Tiotropium Bromide Monohydrate (SPIRIVA RESPIMAT) 2.5 MCG/ACT AERS Inhale 2 puffs into the lungs daily.   Facility-Administered Encounter Medications as of 09/29/2020  Medication   cyanocobalamin ((VITAMIN B-12)) injection 1,000 mcg    Allergies (verified) Amlodipine, Banana, Co q10 [coenzyme q10], Codeine, Metformin and related, Morphine, Pentazocine, Pravastatin, Repatha [evolocumab], Statins, Sulfa antibiotics, Sulfonamide derivatives, and Tramadol hcl   History: Past Medical History:  Diagnosis Date   Adenomatous colon polyp    Allergy    Anxiety    Asthma    Carotid artery disease ([place]    carotid [place]: bilat ICA 1-39%   Chronic diastolic CHF    Echo 15/3614 EF 65-70, Gr 2 DD, mild MS (mean 5), PASP 44   COPD    pt is unsure if has been officially diagnosed   Coronary artery disease    CABG '09- cathed 12/09, 9/10, 6/11, 3/14 and 12/13/16- medical Rx // cath 12/2016 - 2/3 grafts patent >> med Rx // Myoview 12/17: low risk  Diabetes mellitus    Gastroesophageal reflux disease    Hiatal hernia    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   Hyperlipidemia    Hypertension    PONV (postoperative nausea and vomiting)    Schatzki's ring    Shoulder injury    resolved after shoulder surgery   Sleep apnea    not on cpap   Past Surgical History:  Procedure Laterality Date   ABDOMINAL HYSTERECTOMY     APPENDECTOMY     came out with Hysterectomy   CARDIAC CATHETERIZATION  07/23/2009   EF 60%   CARDIAC CATHETERIZATION  10/11/2008   CARDIAC CATHETERIZATION  03/01/2007   EF 75-80%   CARDIOVASCULAR STRESS TEST  11/15/2007   EF 60%   COLONOSCOPY     CORONARY ARTERY BYPASS GRAFT     SEVERELY DISEASED SAPHENOUS VEIN GRAFT TO THE RIGHT CORONARY ARTERY BUT WITH FAIRLY WELL PRESERVED FLOW TO THE DISTAL RIGHT CORONARY ARTERY FROM THE NATIVE CIRCULATION-RESTART  CATH IN JUNE 2000, REVEALS MILD/MODERATE  CAD WITH GOOD FLOW DOWN HER LAD   ESOPHAGOGASTRODUODENOSCOPY     EYE SURGERY      bilateral cataract surgery with lens implant   LEFT HEART CATH AND CORS/GRAFTS ANGIOGRAPHY N/A 12/14/2016   Procedure: LEFT HEART CATH AND CORS/GRAFTS ANGIOGRAPHY;  Surgeon: Jettie Booze, MD;  Location: [facility];  Service: Cardiovascular;  Laterality: N/A;   lense removal Left    POLYPECTOMY     ROTATOR CUFF REPAIR     right and left   TONSILLECTOMY     age 64   TOTAL KNEE ARTHROPLASTY Right 02/20/2014   Procedure: RIGHT TOTAL KNEE ARTHROPLASTY;  Surgeon: Tobi Bastos, MD;  Location: [facility];  Service: Orthopedics;  Laterality: Right;   tumor removed kidney     UPPER GASTROINTESTINAL ENDOSCOPY     US ECHOCARDIOGRAPHY  03/08/2008   EF 55-60%   Family History  Problem Relation Age of Onset   Heart disease Maternal Grandfather    Heart failure Maternal Grandfather    Diabetes Maternal Grandfather    Heart attack Father    Diabetes Mother    Rheum arthritis Sister    Emphysema Paternal Uncle    Esophageal cancer Brother 43       she said he was born with it   Emphysema Paternal Aunt    Healthy Child    Neuropathy Neg Hx    Multiple sclerosis Neg Hx    Colon cancer Neg Hx    Colon polyps Neg Hx    Rectal cancer Neg Hx    Stomach cancer Neg Hx    Social History   Socioeconomic History   Marital status: Widowed    Spouse name: Not on file   Number of children: 4   Years of education: Doctorate   Highest education level: Doctorate  Occupational History   Occupation: Retired  Tobacco Use   Smoking status: Former    Packs/day: 0.50    Years: 21.00    Pack years: 10.50    Types: Cigarettes    Start date: 02/09/1955    Quit date: 02/09/1976    Years since quitting: 44.6   Smokeless tobacco: Never  Vaping Use   Vaping Use: Never used  Substance and Sexual Activity   Alcohol use: No    Alcohol/week: 0.0 standard drinks   Drug use: No   Sexual activity: Never  Other Topics Concern   Not on file  Social  History Narrative   Lives alone.  One story  home.  Has 4 children.  Education: doctorate in theology.    Caffeine use: Drinks 1 cup coffee/day      Originally from [place]. Previously has lived in [place]. Prior travel to [place], [place], [place], [place], [place], [place], [place], & [place]. Previously worked in [employer] She has a dog currently. No bird, mold, or hot tub exposure. She also pastors a church.    Social Determinants of Health   Financial Resource Strain: Low Risk    Difficulty of Paying Living Expenses: Not hard at all  Food Insecurity: No Food Insecurity   Worried About Charity fundraiser in the Last Year: Never true   [place] in the Last Year: Never true  Transportation Needs: No Transportation Needs   Lack of Transportation (Medical): No   Lack of Transportation (Non-Medical): No  Physical Activity: Inactive   Days of Exercise per Week: 0 days   Minutes of Exercise per Session: 0 min  Stress: No Stress Concern Present   Feeling of Stress : Not at all  Social Connections: Moderately Integrated   Frequency of Communication with Friends and Family: More than three times a week   Frequency of Social Gatherings with Friends and Family: More than three times a week   Attends Religious Services: More than 4 times per year   Active Member of [employer] or Organizations: Yes   Attends Archivist Meetings: More than 4 times per year   Marital Status: Widowed    Tobacco Counseling Counseling given: Not Answered   Clinical Intake:  Pre-visit preparation completed: Yes  Pain : No/denies pain Pain Score: 0-No pain     BMI - recorded: 34.64 Nutritional Status: BMI > 30  Obese Nutritional Risks: None Diabetes: Yes CBG done?: No Did pt. bring in CBG monitor from home?: No  How often do you need to have someone help you when you read instructions, pamphlets, or other written materials from your doctor or pharmacy?: 1 - Never What is the last grade level you completed in school?: Doctorate in  Ministry  Diabetic? yes  Interpreter Needed?: No  Information entered by :: Lisette Abu, LPN   Activities of Daily Living In your present state of health, do you have any difficulty performing the following activities: 09/29/2020 10/30/2019  Hearing? N N  Vision? N N  Difficulty concentrating or making decisions? Y N  Walking or climbing stairs? N N  Dressing or bathing? N N  Doing errands, shopping? N N  Preparing Food and eating ? N -  Using the Toilet? N -  In the past six months, have you accidently leaked urine? N -  Do you have problems with loss of bowel control? Y -  Managing your Medications? N -  Managing your Finances? N -  Housekeeping or managing your Housekeeping? N -  Some recent data might be hidden    Patient Care Team: Binnie Rail, MD as PCP - General (Internal Medicine) Nahser, Wonda Cheng, MD as PCP - Cardiology (Cardiology) Elsie Stain, MD as Attending Physician (Pulmonary Disease) Ladene Artist, MD as Consulting Physician (Gastroenterology) Javier Glazier, MD as Consulting Physician (Pulmonary Disease) Pieter Partridge, DO as Consulting Physician (Neurology) Sharmon Revere as Physician Assistant (Cardiology) Delice Bison Darnelle Maffucci, [facility] as Pharmacist (Pharmacist) Jola Schmidt, MD as Consulting Physician (Ophthalmology)  Indicate any recent Medical Services you may have received from other  than [facility] providers in the past year (date may be approximate).     Assessment:   This is a routine wellness examination for Tammara.  Hearing/Vision screen Hearing Screening - Comments:: Patient denied any hearing difficulty. Vision Screening - Comments:: Patient has lens implants.  Eye exam done by Jola Schmidt, MD.  Dietary issues and exercise activities discussed: Current Exercise Habits: The patient does not participate in regular exercise at present, Exercise limited by: respiratory conditions(s);psychological condition(s);cardiac  condition(s);orthopedic condition(s) (vertigo issues)   Goals Addressed               This Visit's Progress     Patient Stated (pt-stated)        My goal is to get back to water aerobics.      Depression Screen PHQ 2/9 Scores 09/29/2020 10/30/2019 08/01/2018 01/24/2018 01/14/2017 11/23/2016 04/22/2015  PHQ - 2 Score 0 0 0 1 2 0 0  PHQ- 9 Score - - - 4 6 - -    Fall Risk Fall Risk  09/29/2020 12/21/2018 08/17/2018 08/01/2018 04/17/2018  Falls in the past year? 0 0 0 0 1  Number falls in past yr: 0 0 - 0 0  Injury with Fall? 0 0 - - 0  Risk Factor Category  - - - - -  Risk for fall due to : No Fall Risks - - - -  Follow up Falls evaluation completed - Falls evaluation completed - Falls evaluation completed    FALL RISK PREVENTION PERTAINING TO THE HOME:  Any stairs in or around the home? No  If so, are there any without handrails? No  Home free of loose throw rugs in walkways, pet beds, electrical cords, etc? Yes  Adequate lighting in your home to reduce risk of falls? Yes   ASSISTIVE DEVICES UTILIZED TO PREVENT FALLS:  Life alert? No  Use of a cane, walker or w/c? No  Grab bars in the bathroom? No  Shower chair or bench in shower? No  Elevated toilet seat or a handicapped toilet? No   TIMED UP AND GO:  Was the test performed? Yes .  Length of time to ambulate 10 feet: 6 sec.   Gait steady and fast without use of assistive device  Cognitive Function: Normal cognitive status assessed by direct observation by this Nurse Health Advisor. No abnormalities found.   MMSE - Mini Mental State Exam 01/24/2018 01/14/2017  Orientation to time 5 5  Orientation to Place 5 5  Registration 3 3  Attention/ Calculation 5 5  Recall 2 1  Language- name 2 objects 2 2  Language- repeat 1 1  Language- follow 3 step command 3 3  Language- read & follow direction 1 1  Write a sentence 1 1  Copy design 1 1  Total score 29 28        Immunizations Immunization History  Administered  Date(s) Administered   Fluad Quad(high Dose 65+) 09/29/2018   Influenza Split 12/15/2011, 11/08/2012, 11/19/2014   Influenza Whole 10/09/2008   Influenza, High Dose Seasonal PF 11/19/2015, 11/23/2016, 10/26/2017   Influenza-Unspecified 10/29/2013   PFIZER(Purple Top)SARS-COV-2 Vaccination 04/05/2019, 05/01/2019   Pneumococcal Conjugate-13 09/03/2015   Pneumococcal Polysaccharide-23 02/09/2004   Pneumococcal-Unspecified 02/09/2012    TDAP status: Due, Education has been provided regarding the importance of this vaccine. Advised may receive this vaccine at local pharmacy or Health Dept. Aware to provide a copy of the vaccination record if obtained from local pharmacy or Health Dept. Verbalized acceptance and understanding. (Patient  Denied)  Flu Vaccine status: Due, Education has been provided regarding the importance of this vaccine. Advised may receive this vaccine at local pharmacy or Health Dept. Aware to provide a copy of the vaccination record if obtained from local pharmacy or Health Dept. Verbalized acceptance and understanding.  Pneumococcal vaccine status: Up to date  Covid-19 vaccine status: Completed vaccines  Qualifies for Shingles Vaccine? Yes   Zostavax completed No   Shingrix Completed?: No.    Education has been provided regarding the importance of this vaccine. Patient has been advised to call insurance company to determine out of pocket expense if they have not yet received this vaccine. Advised may also receive vaccine at local pharmacy or Health Dept. Verbalized acceptance and understanding.  Screening Tests Health Maintenance  Topic Date Due   TETANUS/TDAP  Never done   Zoster Vaccines- Shingrix (1 of 2) Never done   DEXA SCAN  Never done   FOOT EXAM  01/25/2019   COVID-19 Vaccine (3 - Booster for Pfizer series) 10/01/2019   OPHTHALMOLOGY EXAM  02/06/2020   INFLUENZA VACCINE  09/08/2020   PNA vac Low Risk Adult  Completed   HPV VACCINES  Aged Out    Health  Maintenance  Health Maintenance Due  Topic Date Due   TETANUS/TDAP  Never done   Zoster Vaccines- Shingrix (1 of 2) Never done   DEXA SCAN  Never done   FOOT EXAM  01/25/2019   COVID-19 Vaccine (3 - Booster for Pfizer series) 10/01/2019   OPHTHALMOLOGY EXAM  02/06/2020   INFLUENZA VACCINE  09/08/2020    Colorectal cancer screening: No longer required.   Mammogram status: Completed 12/11/2019. Repeat every year  Bone density: never done  Lung Cancer Screening: (Low Dose CT Chest recommended if Age 35-80 years, 30 pack-year currently smoking OR have quit w/in 15years.) does not qualify.   Lung Cancer Screening Referral: no  Additional Screening:  Hepatitis C Screening: does not qualify; Completed no  Vision Screening: Recommended annual ophthalmology exams for early detection of glaucoma and other disorders of the eye. Is the patient up to date with their annual eye exam?  Yes  Who is the provider or what is the name of the office in which the patient attends annual eye exams? Jola Schmidt, [place]. If pt is not established with a provider, would they like to be referred to a provider to establish care? No .   Dental Screening: Recommended annual dental exams for proper oral hygiene  Community Resource Referral / Chronic Care Management: CRR required this visit?  No   CCM required this visit?  No      Plan:     I have personally reviewed and noted the following in the patient's chart:   Medical and social history Use of alcohol, tobacco or illicit drugs  Current medications and supplements including opioid prescriptions.  Functional ability and status Nutritional status Physical activity Advanced directives List of other physicians Hospitalizations, surgeries, and ER visits in previous 12 months Vitals Screenings to include cognitive, depression, and falls Referrals and appointments  In addition, I have reviewed and discussed with patient certain preventive  protocols, quality metrics, and best practice recommendations. A written personalized care plan for preventive services as well as general preventive health recommendations were provided to patient.     Sheral Flow, [place]   06/11/5463   Nurse Notes: n/a

## 2020-09-29 NOTE — Patient Instructions (Signed)
[name] , Thank you for taking time to come for your Medicare Wellness Visit. I appreciate your ongoing commitment to your health goals. Please review the following plan we discussed and let me know if I can assist you in the future.   Screening recommendations/referrals: Colonoscopy: not a candidate for screening due to age 80: 12/11/2019 Bone Density: 11/29/2017 Recommended yearly ophthalmology/optometry visit for glaucoma screening and checkup Recommended yearly dental visit for hygiene and checkup  Vaccinations: Influenza vaccine: due Fall 2022 Pneumococcal vaccine: 1/1/20104, 09/03/2015 Tdap vaccine: never done Shingles vaccine: never done   Covid-19: 04/05/2019, 05/01/2019, 02/18/2020  Advanced directives: Please bring a copy of your health care power of attorney and living will to the office at your convenience.  Conditions/risks identified: Yes; Client understands the importance of follow-up with providers by attending scheduled visits and discussed goals to eat healthier, increase physical activity, exercise the brain, socialize more, get enough sleep and make time for laughter.  Next appointment: Please schedule your next Medicare Wellness Visit with your Nurse Health Advisor in 1 year by calling [phone].   Preventive Care 28 Years and Older, Female Preventive care refers to lifestyle choices and visits with your health care provider that can promote health and wellness. What does preventive care include? A yearly physical exam. This is also called an annual well check. Dental exams once or twice a year. Routine eye exams. Ask your health care provider how often you should have your eyes checked. Personal lifestyle choices, including: Daily care of your teeth and gums. Regular physical activity. Eating a healthy diet. Avoiding tobacco and drug use. Limiting alcohol use. Practicing safe sex. Taking low-dose aspirin every day. Taking vitamin and mineral  supplements as recommended by your health care provider. What happens during an annual well check? The services and screenings done by your health care provider during your annual well check will depend on your age, overall health, lifestyle risk factors, and family history of disease. Counseling  Your health care provider may ask you questions about your: Alcohol use. Tobacco use. Drug use. Emotional well-being. Home and relationship well-being. Sexual activity. Eating habits. History of falls. Memory and ability to understand (cognition). Work and work Statistician. Reproductive health. Screening  You may have the following tests or measurements: Height, weight, and BMI. Blood pressure. Lipid and cholesterol levels. These may be checked every 5 years, or more frequently if you are over 68 years old. Skin check. Lung cancer screening. You may have this screening every year starting at age 54 if you have a 30-pack-year history of smoking and currently smoke or have quit within the past 15 years. Fecal occult blood test (FOBT) of the stool. You may have this test every year starting at age 36. Flexible sigmoidoscopy or colonoscopy. You may have a sigmoidoscopy every 5 years or a colonoscopy every 10 years starting at age 58. Hepatitis C blood test. Hepatitis B blood test. Sexually transmitted disease (STD) testing. Diabetes screening. This is done by checking your blood sugar (glucose) after you have not eaten for a while (fasting). You may have this done every 1-3 years. Bone density scan. This is done to screen for osteoporosis. You may have this done starting at age 29. Mammogram. This may be done every 1-2 years. Talk to your health care provider about how often you should have regular mammograms. Talk with your health care provider about your test results, treatment options, and if necessary, the need for more tests. Vaccines  Your health care  provider may recommend certain  vaccines, such as: Influenza vaccine. This is recommended every year. Tetanus, diphtheria, and acellular pertussis (Tdap, Td) vaccine. You may need a Td booster every 10 years. Zoster vaccine. You may need this after age 52. Pneumococcal 13-valent conjugate (PCV13) vaccine. One dose is recommended after age 33. Pneumococcal polysaccharide (PPSV23) vaccine. One dose is recommended after age 64. Talk to your health care provider about which screenings and vaccines you need and how often you need them. This information is not intended to replace advice given to you by your health care provider. Make sure you discuss any questions you have with your health care provider. Document Released: 02/21/2015 Document Revised: 10/15/2015 Document Reviewed: 11/26/2014 Elsevier Interactive Patient Education  2017 [place] Prevention in the Home Falls can cause injuries. They can happen to people of all ages. There are many things you can do to make your home safe and to help prevent falls. What can I do on the outside of my home? Regularly fix the edges of walkways and driveways and fix any cracks. Remove anything that might make you trip as you walk through a door, such as a raised step or threshold. Trim any bushes or trees on the path to your home. Use bright outdoor lighting. Clear any walking paths of anything that might make someone trip, such as rocks or tools. Regularly check to see if handrails are loose or broken. Make sure that both sides of any steps have handrails. Any raised decks and porches should have guardrails on the edges. Have any leaves, snow, or ice cleared regularly. Use sand or salt on walking paths during winter. Clean up any spills in your garage right away. This includes oil or grease spills. What can I do in the bathroom? Use night lights. Install grab bars by the toilet and in the tub and shower. Do not use towel bars as grab bars. Use non-skid mats or decals in  the tub or shower. If you need to sit down in the shower, use a plastic, non-slip stool. Keep the floor dry. Clean up any water that spills on the floor as soon as it happens. Remove soap buildup in the tub or shower regularly. Attach bath mats securely with double-sided non-slip rug tape. Do not have throw rugs and other things on the floor that can make you trip. What can I do in the bedroom? Use night lights. Make sure that you have a light by your bed that is easy to reach. Do not use any sheets or blankets that are too big for your bed. They should not hang down onto the floor. Have a firm chair that has side arms. You can use this for support while you get dressed. Do not have throw rugs and other things on the floor that can make you trip. What can I do in the kitchen? Clean up any spills right away. Avoid walking on wet floors. Keep items that you use a lot in easy-to-reach places. If you need to reach something above you, use a strong step stool that has a grab bar. Keep electrical cords out of the way. Do not use floor polish or wax that makes floors slippery. If you must use wax, use non-skid floor wax. Do not have throw rugs and other things on the floor that can make you trip. What can I do with my stairs? Do not leave any items on the stairs. Make sure that there are handrails on both  sides of the stairs and use them. Fix handrails that are broken or loose. Make sure that handrails are as long as the stairways. Check any carpeting to make sure that it is firmly attached to the stairs. Fix any carpet that is loose or worn. Avoid having throw rugs at the top or bottom of the stairs. If you do have throw rugs, attach them to the floor with carpet tape. Make sure that you have a light switch at the top of the stairs and the bottom of the stairs. If you do not have them, ask someone to add them for you. What else can I do to help prevent falls? Wear shoes that: Do not have high  heels. Have rubber bottoms. Are comfortable and fit you well. Are closed at the toe. Do not wear sandals. If you use a stepladder: Make sure that it is fully opened. Do not climb a closed stepladder. Make sure that both sides of the stepladder are locked into place. Ask someone to hold it for you, if possible. Clearly mark and make sure that you can see: Any grab bars or handrails. First and last steps. Where the edge of each step is. Use tools that help you move around (mobility aids) if they are needed. These include: Canes. Walkers. Scooters. Crutches. Turn on the lights when you go into a dark area. Replace any light bulbs as soon as they burn out. Set up your furniture so you have a clear path. Avoid moving your furniture around. If any of your floors are uneven, fix them. If there are any pets around you, be aware of where they are. Review your medicines with your doctor. Some medicines can make you feel dizzy. This can increase your chance of falling. Ask your doctor what other things that you can do to help prevent falls. This information is not intended to replace advice given to you by your health care provider. Make sure you discuss any questions you have with your health care provider. Document Released: 11/21/2008 Document Revised: 07/03/2015 Document Reviewed: 03/01/2014 Elsevier Interactive Patient Education  2017 [employer].

## 2020-10-09 NOTE — Progress Notes (Signed)
Subjective:    Patient ID: [name], female    DOB: 1941-01-07, 80 y.o.   MRN: [id number]  HPI The patient is here for an acute visit for cognitive issues and vertigo   She has had intermittent vertigo for years, but this most recent episode has lasted longer than usual.  She tends to get vertigo in the morning and lasts 4 hrs and then will improve, but will come back around dinnertime.    She is dizzy, nauseated, staggering when she walks, her appetite is decreased.  She is uncomfortable walking by herself.  She is fearful of falling.  She increased the meclizine from bid to tid and it helps, but the medication causes her not to want to do anything but sit and rest and she does not do anything.    She is taking the Valium every morning and that does not seem to help much with the vertigo, but is more effective for anxiety than the clonazepam.  She is scheduled to start physical therapy next week for the vertigo, but she is not hopeful that it will help since it has not helped in the past.  Vertigo is related to head movements.   Medications and allergies reviewed with patient and updated if appropriate.  Patient Active Problem List   Diagnosis Date Noted   Dysuria 09/09/2020   Anxiety 09/09/2020   Coronary artery disease    Pelvic pain 10/30/2019   Chest pain at rest 08/24/2018   Arthralgia 08/01/2017   COPD with asthma ([place]) 05/04/2017   Angina pectoris ([place]) 01/05/2017   Insomnia 11/23/2016   Chronic nonintractable headache 11/23/2016   Pancreatic insufficiency 10/28/2016   Carotid artery disease ([place]) 05/19/2016   Fatigue 05/19/2016   Anemia 03/27/2016   Type 1 diabetes mellitus ([place]) 07/21/2015   B12 deficiency 03/31/2015   OSA (obstructive sleep apnea) with hypoxia 11/20/2014   Fecal incontinence 08/28/2014   Neurologic gait dysfunction 08/28/2014   Falls 08/28/2014   Neck pain of over 3 months duration 08/28/2014   H/O total knee replacement 02/20/2014    Obesity (BMI 30-39.9) 11/12/2013   Chronic diastolic CHF (congestive heart failure) ([place]) 11/17/2012   Meniscus, lateral, anterior horn derangement 11/10/2011   Medial meniscus, posterior horn derangement 11/10/2011   Osteoarthritis of right knee 11/10/2011   Vertigo 10/05/2011   Labile hypertension 10/05/2011   CAD (coronary artery disease) 08/27/2010   MUSCLE CRAMPS 12/29/2009   History of myocardial infarction 11/19/2009   Extrinsic asthma 11/19/2009   GERD 11/19/2009   Cough 11/19/2009   Hyperlipidemia 11/18/2009    Current Outpatient Medications on File Prior to Visit  Medication Sig Dispense Refill   albuterol (PROVENTIL HFA;VENTOLIN HFA) 108 (90 BASE) MCG/ACT inhaler Inhale 2 puffs into the lungs every 6 (six) hours as needed. For shortness of breath. 1 Inhaler 0   albuterol (PROVENTIL) (2.5 MG/3ML) 0.083% nebulizer solution USE 1 VIAL IN NEBULIZER 4 TIMES DAILY (Patient taking differently: Take 2.5 mg by nebulization 4 (four) times daily.) 360 mL 1   alum & mag hydroxide-simeth (MAALOX/MYLANTA) 200-200-20 MG/5ML suspension Take 30 mLs as needed by mouth for indigestion or heartburn.     aspirin EC 81 MG tablet Take 1 tablet (81 mg total) by mouth daily.     baclofen (LIORESAL) 20 MG tablet Take 2 tablets (40 mg total) by mouth 2 (two) times daily. Due for f/u appt. 360 tablet 2   Bempedoic Acid-Ezetimibe (NEXLIZET) 180-10 MG TABS Take 1 tablet by mouth  daily. 90 tablet 3   Blood Glucose Monitoring Suppl (ONE TOUCH ULTRA MINI) w/Device KIT 3 (three) times daily. for testing  0   calcium carbonate (OS-CAL) 1250 (500 Ca) MG chewable tablet Chew 1 tablet by mouth as needed for heartburn.     calcium carbonate (TUMS - DOSED IN MG ELEMENTAL CALCIUM) 500 MG chewable tablet Chew 2 tablets daily as needed by mouth for indigestion or heartburn.     Cholecalciferol (VITAMIN D) 2000 UNITS tablet Take 2,000 Units by mouth daily.     clopidogrel (PLAVIX) 75 MG tablet Take 75 mg by mouth daily.      Continuous Blood Gluc Sensor (FREESTYLE LIBRE 14 DAY SENSOR) MISC See admin instructions.     Cyanocobalamin (VITAMIN B-12 IJ) Inject 1,000 mcg as directed every 30 (thirty) days.     cyclobenzaprine (FLEXERIL) 5 MG tablet Take 5 mg by mouth as needed for muscle spasms.     diazepam (VALIUM) 5 MG tablet Take 1 tablet (5 mg total) by mouth daily. 30 tablet 5   diclofenac sodium (VOLTAREN) 1 % GEL Apply 2 g topically 4 (four) times daily as needed (muscle pain).     diltiazem (CARDIZEM CD) 300 MG 24 hr capsule Take 1 capsule (300 mg total) by mouth daily. 90 capsule 3   famotidine (PEPCID) 40 MG tablet Take 1 tablet (40 mg total) by mouth daily as needed. (Patient taking differently: Take 40 mg by mouth daily as needed for heartburn or indigestion.) 90 tablet 1   fexofenadine (ALLEGRA) 180 MG tablet TAKE 1 TABLET BY MOUTH EVERY DAY (Patient taking differently: Take 180 mg by mouth as needed for allergies.) 90 tablet 3   Fluticasone-Salmeterol (ADVAIR DISKUS) 250-50 MCG/DOSE AEPB Inhale 1 puff into the lungs 2 (two) times daily. (Patient taking differently: Inhale 1 puff into the lungs as needed (shortness of breath).) 60 each 5   furosemide (LASIX) 40 MG tablet Take 1 tablet (40 mg total) by mouth daily. 90 tablet 3   insulin lispro protamine-insulin lispro (HUMALOG 75/25) (75-25) 100 UNIT/ML SUSP Inject 15-30 Units into the skin See admin instructions. Inject 15 units in the morning, 5 units at lunch, and 30 units at dinner.     isosorbide mononitrate (IMDUR) 60 MG 24 hr tablet Take one tablet ( 60 mg ) twice daily 8 hours apart. (Patient taking differently: Take 60 mg by mouth 2 (two) times daily. Take 8 hours apart.) 180 tablet 3   lisinopril (ZESTRIL) 20 MG tablet Take 20 mg by mouth daily.     loperamide (IMODIUM) 2 MG capsule Take 2 mg by mouth as needed for diarrhea or loose stools.      meclizine (ANTIVERT) 25 MG tablet TAKE ONE TABLET BY MOUTH TWICE DAILY AS NEEDED FOR DIZZINESS (Patient  taking differently: Take 25 mg by mouth as needed for dizziness.) 30 tablet 0   metoprolol succinate (TOPROL-XL) 100 MG 24 hr tablet TAKE ONE TABLET BY MOUTH EVERY DAY (Patient taking differently: Take 100 mg by mouth daily.) 90 tablet 3   Multiple Vitamin (MULTIVITAMIN WITH MINERALS) TABS Take 1 tablet by mouth daily.     Nebulizers (COMPRESSOR/NEBULIZER) MISC Use with albuterol 1 each 0   nitroGLYCERIN (NITROSTAT) 0.4 MG SL tablet Place 0.4 mg under the tongue every 5 (five) minutes as needed for chest pain.     nortriptyline (PAMELOR) 50 MG capsule TAKE 2 CAPSULES (100 MG TOTAL) BY MOUTH AT BEDTIME. 180 capsule 1   ONE TOUCH ULTRA TEST test  strip 1 each by Other route 2 (two) times daily.  3   ONETOUCH DELICA LANCETS 81X MISC 3 (three) times daily. for testing  0   Spacer/Aero-Holding Chambers (AEROCHAMBER MV) inhaler Use as instructed 1 each 0   spironolactone (ALDACTONE) 25 MG tablet Take 1 tablet (25 mg total) by mouth daily. 90 tablet 3   Study - ORION 4 - inclisiran 300 mg/1.87m or placebo SQ injection (PI-Stuckey) Inject 300 mg into the skin every 6 (six) months.     Tiotropium Bromide Monohydrate (SPIRIVA RESPIMAT) 2.5 MCG/ACT AERS Inhale 2 puffs into the lungs daily. 4 g 6   Current Facility-Administered Medications on File Prior to Visit  Medication Dose Route Frequency Provider Last Rate Last Admin   cyanocobalamin ((VITAMIN B-12)) injection 1,000 mcg  1,000 mcg Intramuscular Q30 days BBinnie Rail MD   1,000 mcg at 03/26/16 1658    Past Medical History:  Diagnosis Date   Adenomatous colon polyp    Allergy    Anxiety    Asthma    Carotid artery disease ([place]    carotid [place]/2019: bilat ICA 1-39%   Chronic diastolic CHF    Echo 19/1478 EF 65-70, Gr 2 DD, mild MS (mean 5), PASP 44   COPD    pt is unsure if has been officially diagnosed   Coronary artery disease    CABG '09- cathed 12/09, 9/10, 6/11, 3/14 and 12/13/16- medical Rx // cath 12/2016 - 2/3 grafts patent >> med  Rx // Myoview 12/17: low risk   Diabetes mellitus    Gastroesophageal reflux disease    Hiatal hernia    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   Hyperlipidemia    Hypertension    PONV (postoperative nausea and vomiting)    Schatzki's ring    Shoulder injury    resolved after shoulder surgery   Sleep apnea    not on cpap    Past Surgical History:  Procedure Laterality Date   ABDOMINAL HYSTERECTOMY     APPENDECTOMY     came out with Hysterectomy   CARDIAC CATHETERIZATION  07/23/2009   EF 60%   CARDIAC CATHETERIZATION  10/11/2008   CARDIAC CATHETERIZATION  03/01/2007   EF 75-80%   CARDIOVASCULAR STRESS TEST  11/15/2007   EF 60%   COLONOSCOPY     CORONARY ARTERY BYPASS GRAFT     SEVERELY DISEASED SAPHENOUS VEIN GRAFT TO THE RIGHT CORONARY ARTERY BUT WITH FAIRLY WELL PRESERVED FLOW TO THE DISTAL RIGHT CORONARY ARTERY FROM THE NATIVE CIRCULATION-RESTART  CATH IN JUNE 2000, REVEALS MILD/MODERATE  CAD WITH GOOD FLOW DOWN HER LAD   ESOPHAGOGASTRODUODENOSCOPY     EYE SURGERY     bilateral cataract surgery with lens implant   LEFT HEART CATH AND CORS/GRAFTS ANGIOGRAPHY N/A 12/14/2016   Procedure: LEFT HEART CATH AND CORS/GRAFTS ANGIOGRAPHY;  Surgeon: VJettie Booze MD;  [facility];  Service: Cardiovascular;  Laterality: N/A;   lense removal Left    POLYPECTOMY     ROTATOR CUFF REPAIR     right and left   TONSILLECTOMY     age 80  TOTAL KNEE ARTHROPLASTY Right 02/20/2014   Procedure: RIGHT TOTAL KNEE ARTHROPLASTY;  Surgeon: RTobi Bastos MD;  Location: [facility];  Service: Orthopedics;  Laterality: Right;   tumor removed kidney     UPPER GASTROINTESTINAL ENDOSCOPY     [place]ARDIOGRAPHY  03/08/2008   EF 55-60%    Social History  Socioeconomic History   Marital status: Widowed    Spouse name: Not on file   Number of children: 4   Years of education: Doctorate   Highest education level: Doctorate  Occupational History   Occupation: Retired   Tobacco Use   Smoking status: Former    Packs/day: 0.50    Years: 21.00    Pack years: 10.50    Types: Cigarettes    Start date: 02/09/1955    Quit date: 02/09/1976    Years since quitting: 44.7   Smokeless tobacco: Never  Vaping Use   Vaping Use: Never used  Substance and Sexual Activity   Alcohol use: No    Alcohol/week: 0.0 standard drinks   Drug use: No   Sexual activity: Never  Other Topics Concern   Not on file  Social History Narrative   Lives alone.  One story home.  Has 4 children.  Education: doctorate in theology.    Caffeine use: Drinks 1 cup coffee/day      Originally from [place]. Previously has lived in [place]. Prior travel to [place], [place], [place], [place], [place], [place], [place], & [place]. Previously worked in [employer] She has a dog currently. No bird, mold, or hot tub exposure. She also pastors a church.    Social Determinants of Health   Financial Resource Strain: Low Risk    Difficulty of Paying Living Expenses: Not hard at all  Food Insecurity: No Food Insecurity   Worried About Charity fundraiser in the Last Year: Never true   [place] in the Last Year: Never true  Transportation Needs: No Transportation Needs   Lack of Transportation (Medical): No   Lack of Transportation (Non-Medical): No  Physical Activity: Inactive   Days of Exercise per Week: 0 days   Minutes of Exercise per Session: 0 min  Stress: No Stress Concern Present   Feeling of Stress : Not at all  Social Connections: Moderately Integrated   Frequency of Communication with Friends and Family: More than three times a week   Frequency of Social Gatherings with Friends and Family: More than three times a week   Attends Religious Services: More than 4 times per year   Active Member of [employer] or Organizations: Yes   Attends Archivist Meetings: More than 4 times per year   Marital Status: Widowed    Family History  Problem Relation Age of Onset   Heart disease Maternal  Grandfather    Heart failure Maternal Grandfather    Diabetes Maternal Grandfather    Heart attack Father    Diabetes Mother    Rheum arthritis Sister    Emphysema Paternal Uncle    Esophageal cancer Brother 83       she said he was born with it   Emphysema Paternal Aunt    Healthy Child    Neuropathy Neg Hx    Multiple sclerosis Neg Hx    Colon cancer Neg Hx    Colon polyps Neg Hx    Rectal cancer Neg Hx    Stomach cancer Neg Hx     Review of Systems  Constitutional:  Negative for fever.  HENT:  Negative for congestion, ear pain, sinus pain and sore throat.   Respiratory:  Positive for shortness of breath.   Cardiovascular:  Positive for chest pain (chest pressure) and leg swelling. Negative for palpitations.  Neurological:  Positive for dizziness, light-headedness and headaches. Negative for weakness and numbness.  Objective:   Vitals:   10/10/20 1503  BP: (!) 148/90  Pulse: 68  Resp: 16  Temp: 98.7 F (37.1 C)   BP Readings from Last 3 Encounters:  10/10/20 (!) 148/90  09/29/20 138/80  09/23/20 128/78   Wt Readings from Last 3 Encounters:  10/10/20 191 lb 6.4 oz (86.8 kg)  09/29/20 189 lb 6.4 oz (85.9 kg)  09/09/20 192 lb 6.4 oz (87.3 kg)   Body mass index is 35.01 kg/m.   Physical Exam Constitutional:      General: She is not in acute distress.    Appearance: Normal appearance. She is not ill-appearing.  HENT:     Head: Normocephalic and atraumatic.     Right Ear: Tympanic membrane, ear canal and external ear normal. There is no impacted cerumen.     Left Ear: Tympanic membrane, ear canal and external ear normal. There is no impacted cerumen.     Nose: Nose normal.     Mouth/Throat:     Mouth: Mucous membranes are moist.     Pharynx: No posterior oropharyngeal erythema.  Eyes:     Extraocular Movements: Extraocular movements intact.     Conjunctiva/sclera: Conjunctivae normal.     Comments: No nystagmus  Neck:     Vascular: No carotid bruit.   Cardiovascular:     Rate and Rhythm: Normal rate and regular rhythm.  Pulmonary:     Effort: Pulmonary effort is normal. No respiratory distress.     Breath sounds: No wheezing or rales.  Musculoskeletal:     Cervical back: Neck supple. No tenderness.     Right lower leg: No edema.     Left lower leg: No edema.  Lymphadenopathy:     Cervical: No cervical adenopathy.  Neurological:     General: No focal deficit present.     Mental Status: She is alert and oriented to person, place, and time.           MR BRAIN WO CONTRAST CLINICAL DATA:  Initial evaluation for acute dizziness, ataxia, vertigo.  EXAM: MRI HEAD WITHOUT CONTRAST  TECHNIQUE: Multiplanar, multiecho pulse sequences of the brain and surrounding structures were obtained without intravenous contrast.  COMPARISON:  Prior MRI from 09/16/2014.  FINDINGS: Brain: Examination moderately degraded by motion artifact.  Cerebral volume within normal limits. No significant cerebral white matter disease or other focal parenchymal signal abnormality seen on this motion degraded exam.  No abnormal foci of restricted diffusion to suggest acute or subacute ischemia. Gray-white matter differentiation maintained. No encephalomalacia to suggest chronic cortical infarction. No visible foci of susceptibility artifact to suggest acute or chronic intracranial hemorrhage.  No mass lesion, midline shift or mass effect no hydrocephalus or extra-axial fluid collection. Empty sella noted. Midline structures intact.  Vascular: Major intracranial vascular flow voids are maintained at the skull base.  Skull and upper cervical spine: Craniocervical junction within normal limits. Upper cervical spine within normal limits. Bone marrow signal intensity normal. No scalp soft tissue abnormality.  Sinuses/Orbits: Prior bilateral ocular lens replacement. Paranasal sinuses are grossly clear. No mastoid effusion.  Other:  None.  IMPRESSION: 1. Motion degraded exam. 2. No acute intracranial abnormality. 3. Empty sella. While this finding is often incidental in nature and of no clinical significance, this can also be seen in the setting of idiopathic intracranial hypertension.  Electronically Signed   By: Jeannine Boga M.D.   On: 08/24/2020 21:26   Lab Results  Component Value Date   WBC 7.4 08/24/2020  HGB 13.2 08/24/2020   HCT 41.6 08/24/2020   PLT 316 08/24/2020   GLUCOSE 246 (H) 09/23/2020   CHOL 139 06/25/2020   TRIG 132 06/25/2020   HDL 30 (L) 06/25/2020   LDLCALC 85 06/25/2020   ALT 9 12/19/2019   AST 14 12/19/2019   NA 142 09/23/2020   K 4.7 09/23/2020   CL 100 09/23/2020   CREATININE 0.88 09/23/2020   BUN 18 09/23/2020   CO2 27 09/23/2020   TSH 0.76 02/12/2019   INR WILL FOLLOW 03/07/2019   HGBA1C 8.1 (H) 12/19/2019   MICROALBUR 2.0 10/30/2019      Assessment & Plan:    See Problem List for Assessment and Plan of chronic medical problems.    This visit occurred during the SARS-CoV-2 public health emergency.  Safety protocols were in place, including screening questions prior to the visit, additional usage of staff PPE, and extensive cleaning of exam room while observing appropriate contact time as indicated for disinfecting solutions.

## 2020-10-10 ENCOUNTER — Other Ambulatory Visit: Payer: Self-pay

## 2020-10-10 ENCOUNTER — Encounter: Payer: Self-pay | Admitting: Internal Medicine

## 2020-10-10 ENCOUNTER — Ambulatory Visit (INDEPENDENT_AMBULATORY_CARE_PROVIDER_SITE_OTHER): Payer: Medicare Other | Admitting: Internal Medicine

## 2020-10-10 VITALS — BP 148/90 | HR 68 | Temp 98.7°F | Resp 16 | Ht 62.0 in | Wt 191.4 lb

## 2020-10-10 DIAGNOSIS — R42 Dizziness and giddiness: Secondary | ICD-10-CM

## 2020-10-10 DIAGNOSIS — E538 Deficiency of other specified B group vitamins: Secondary | ICD-10-CM | POA: Diagnosis not present

## 2020-10-10 DIAGNOSIS — R0989 Other specified symptoms and signs involving the circulatory and respiratory systems: Secondary | ICD-10-CM | POA: Diagnosis not present

## 2020-10-10 MED ORDER — CYANOCOBALAMIN 1000 MCG/ML IJ SOLN
1000.0000 ug | Freq: Once | INTRAMUSCULAR | Status: AC
  Administered 2020-10-10: 1000 ug via INTRAMUSCULAR

## 2020-10-10 NOTE — Patient Instructions (Addendum)
Medications changes include :  none   A referral was ordered for ENT and Neurology.       Someone from their office will call you to schedule an appointment.    Vertigo Vertigo is the feeling that you or your surroundings are moving when they are not. This feeling can come and go at any time. Vertigo often goes away on its own. Vertigo can be dangerous if it occurs while you are doing something that could endanger yourself or others, such as driving or operating machinery. Your health care provider will do tests to try to determine the cause of your vertigo. Tests will also help your health care provider decide how best to treat your condition. Follow these instructions at home: Eating and drinking   Dehydration can make vertigo worse. Drink enough fluid to keep your urine pale yellow. Do not drink alcohol. Activity Return to your normal activities as told by your health care provider. Ask your health care provider what activities are safe for you. In the morning, first sit up on the side of the bed. When you feel okay, stand slowly while you hold onto something until you know that your balance is fine. Move slowly. Avoid sudden body or head movements or certain positions, as told by your health care provider. If you have trouble walking or keeping your balance, try using a cane for stability. If you feel dizzy or unstable, sit down right away. Avoid doing any tasks that would cause danger to you or others if vertigo occurs. Avoid bending down if you feel dizzy. Place items in your home so that they are easy for you to reach without bending or leaning over. Do not drive or use machinery if you feel dizzy. General instructions Take over-the-counter and prescription medicines only as told by your health care provider. Keep all follow-up visits. This is important. Contact a health care provider if: Your medicines do not relieve your vertigo or they make it worse. Your condition gets  worse or you develop new symptoms. You have a fever. You develop nausea or vomiting, or if nausea gets worse. Your family or friends notice any behavioral changes. You have numbness or a prickling and tingling sensation in part of your body. Get help right away if you: Are always dizzy or you faint. Develop severe headaches. Develop a stiff neck. Develop sensitivity to light. Have difficulty moving or speaking. Have weakness in your hands, arms, or legs. Have changes in your hearing or vision. These symptoms may represent a serious problem that is an emergency. Do not wait to see if the symptoms will go away. Get medical help right away. Call your local emergency services (911 in the [place] Do not drive yourself to the hospital. Summary Vertigo is the feeling that you or your surroundings are moving when they are not. Your health care provider will do tests to try to determine the cause of your vertigo. Follow instructions for home care. You may be told to avoid certain tasks, positions, or movements. Contact a health care provider if your medicines do not relieve your symptoms, or if you have a fever, nausea, vomiting, or changes in behavior. Get help right away if you have severe headaches or difficulty speaking, or you develop hearing or vision problems. This information is not intended to replace advice given to you by your health care provider. Make sure you discuss any questions you have with your health care provider. Document Revised: 12/26/2019 Document Reviewed:  12/26/2019 Elsevier Patient Education  2022 [employer].

## 2020-10-11 NOTE — Assessment & Plan Note (Signed)
Chronic Monthly B12 injections She is overdue for her B12 injection and will receive 1 today

## 2020-10-11 NOTE — Assessment & Plan Note (Addendum)
Chronic and intermittent over the years Disc most current episode started about 2 months ago and has been persistent-in the past her episodes have never lasted this long And is related to head movements and is intermittent, but episodes can last for hours and can come a couple times a day She is associated nausea, unbalanced feeling and is concerned about falling To start vestibular physical therapy next week-this is not worked in the past Taking meclizine 25 mg 3 times daily as needed, which helps some, but makes her drowsy and she is doing less around the house because of it which concerns her Taking Valium 5 mg every morning, which is not helping Has tried nondrowsy Dramamine in the past-not effective ?  Medication related-before this episode started there was no changes in medication and most of her medication she has been on for a long time, but medication is a possibility At this point I am unsure what else I can do to help Will refer to ENT and neurology for their input

## 2020-10-11 NOTE — Assessment & Plan Note (Signed)
Chronic Blood pressure is labile BP Readings from Last 3 Encounters:  10/10/20 (!) 148/90  09/29/20 138/80  09/23/20 128/78   Overall blood pressure has been fairly controlled-slightly higher here than it has been earlier this month No change in medication Continue Cardizem 300 mg daily, lisinopril 20 mg daily, metoprolol XL 100 mg daily, spironolactone 25 mg daily

## 2020-10-14 ENCOUNTER — Other Ambulatory Visit: Payer: Self-pay | Admitting: Cardiovascular Disease

## 2020-10-16 ENCOUNTER — Encounter: Payer: Self-pay | Admitting: Physical Therapy

## 2020-10-16 ENCOUNTER — Other Ambulatory Visit: Payer: Self-pay

## 2020-10-16 ENCOUNTER — Ambulatory Visit: Payer: Medicare Other | Attending: Internal Medicine | Admitting: Physical Therapy

## 2020-10-16 DIAGNOSIS — R2689 Other abnormalities of gait and mobility: Secondary | ICD-10-CM | POA: Insufficient documentation

## 2020-10-16 DIAGNOSIS — R2681 Unsteadiness on feet: Secondary | ICD-10-CM | POA: Insufficient documentation

## 2020-10-16 DIAGNOSIS — R42 Dizziness and giddiness: Secondary | ICD-10-CM | POA: Insufficient documentation

## 2020-10-16 DIAGNOSIS — H8113 Benign paroxysmal vertigo, bilateral: Secondary | ICD-10-CM | POA: Diagnosis present

## 2020-10-17 NOTE — Therapy (Signed)
[facility] [address] [place], [place], [address] Phone: [phone]   Fax:  [phone]  Physical Therapy Evaluation  Patient Details  Name: MARTISHA PAONESSA MRN: [id number] Date of Birth: Aug 28, 1940 Referring Provider (PT): Dr. Billey Gosling   Encounter Date: 10/16/2020   PT End of Session - 10/17/20 1117     Visit Number 1    Number of Visits 9    Date for PT Re-Evaluation 12/12/20    Authorization Type [employer] Medicare    Authorization Time Period 10-16-20 - 12-16-20    PT Start Time 1102    PT Stop Time 1150    PT Time Calculation (min) 48 min    Activity Tolerance Patient tolerated treatment well    Behavior During Therapy [facility] for tasks assessed/performed             Past Medical History:  Diagnosis Date   Adenomatous colon polyp    Allergy    Anxiety    Asthma    Carotid artery disease ([place])    carotid US 02/2017: bilat ICA 1-39%   Chronic diastolic CHF    Echo 123456: EF 65-70, Gr 2 DD, mild MS (mean 5), PASP 44   COPD    pt is unsure if has been officially diagnosed   Coronary artery disease    CABG '09- cathed 12/09, 9/10, 6/11, 3/14 and 12/13/16- medical Rx // cath 12/2016 - 2/3 grafts patent >> med Rx // Myoview 12/17: low risk   Diabetes mellitus    Gastroesophageal reflux disease    Hiatal hernia    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   Hyperlipidemia    Hypertension    PONV (postoperative nausea and vomiting)    Schatzki's ring    Shoulder injury    resolved after shoulder surgery   Sleep apnea    not on cpap    Past Surgical History:  Procedure Laterality Date   ABDOMINAL HYSTERECTOMY     APPENDECTOMY     came out with Hysterectomy   CARDIAC CATHETERIZATION  07/23/2009   EF 60%   CARDIAC CATHETERIZATION  10/11/2008   CARDIAC CATHETERIZATION  03/01/2007   EF 75-80%   CARDIOVASCULAR STRESS TEST  11/15/2007   EF 60%   COLONOSCOPY     CORONARY ARTERY BYPASS GRAFT     SEVERELY  DISEASED SAPHENOUS VEIN GRAFT TO THE RIGHT CORONARY ARTERY BUT WITH FAIRLY WELL PRESERVED FLOW TO THE DISTAL RIGHT CORONARY ARTERY FROM THE NATIVE CIRCULATION-RESTART  CATH IN JUNE 2000, REVEALS MILD/MODERATE  CAD WITH GOOD FLOW DOWN HER LAD   ESOPHAGOGASTRODUODENOSCOPY     EYE SURGERY     bilateral cataract surgery with lens implant   LEFT HEART CATH AND CORS/GRAFTS ANGIOGRAPHY N/A 12/14/2016   Procedure: LEFT HEART CATH AND CORS/GRAFTS ANGIOGRAPHY;  Surgeon: Jettie Booze, MD;  Location: [facility];  Service: Cardiovascular;  Laterality: N/A;   lense removal Left    POLYPECTOMY     ROTATOR CUFF REPAIR     right and left   TONSILLECTOMY     age 58   TOTAL KNEE ARTHROPLASTY Right 02/20/2014   Procedure: RIGHT TOTAL KNEE ARTHROPLASTY;  Surgeon: Tobi Bastos, MD;  Location: [facility];  Service: Orthopedics;  Laterality: Right;   tumor removed kidney     UPPER GASTROINTESTINAL ENDOSCOPY     US ECHOCARDIOGRAPHY  03/08/2008   EF 55-60%    There were no vitals filed  for this visit.    Subjective Assessment - 10/16/20 1113     Subjective Pt states vertigo started approx. a month and a half ago- states she woke up with vertigo one morning and it has not resolved; pt states she has had nausea with it; states it is worse in the morning; says it really hits when she gets undressed to go to bed at night.  Pt reports when vertigo started she tried doing some of the exercises she had been taught in previous OP admission(s) but that the exercises did not help. Pt reports she avoids quick head/body movements because this will cause dizziness.  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  Pt states MRI has been ordered but not yet scheduled.    Pertinent History h/o vertigo; DM type I, h/o MI, chronic CHF, OA, CAD, neurological gait dysfunction, OSA, B12 deficiency, chronic HA's, anxiety    Limitations  Walking;House hold activities    Patient Stated Goals resolve the vertigo - get rid of the headache    Currently in Pain? Yes    Pain Score 7     Pain Location Head    Pain Orientation Right;Left    Pain Descriptors / Indicators Headache    Pain Type Chronic pain    Pain Onset More than a month ago    Pain Frequency Intermittent    Aggravating Factors  unknown    Pain Relieving Factors gets better around dinner time                [facility] PT Assessment - 10/17/20 0001       Assessment   Medical Diagnosis Vertigo    Referring Provider (PT) Dr. Billey Gosling    Onset Date/Surgical Date --   mid July 2022   Prior Therapy Pt has had OP PT at this facility from 11-11-17 - 03-30-18 & 11-06-18 - 01-23-19      Precautions   Precautions Fall      Restrictions   Weight Bearing Restrictions No      Balance Screen   Has the patient fallen in the past 6 months No    Has the patient had a decrease in activity level because of a fear of falling?  No    Is the patient reluctant to leave their home because of a fear of falling?  No      Home Environment   Living Environment Private residence    Living Arrangements Alone    Type of [place] Access Level entry    Home Layout One level    [place] - single point      Prior Function   Level of Independence Independent      Observation/Other Assessments   Focus on Therapeutic Outcomes (FOTO)  DPS 45/100; risk adjusted 46/100:  DFS 42.1/100      Ambulation/Gait   Ambulation/Gait Yes    Ambulation/Gait Assistance 6: Modified independent (Device/Increase time)    Ambulation Distance (Feet) 100 Feet    Assistive device None    Gait Pattern Within Functional Limits    Ambulation Surface Level;Indoor    Gait Comments pt does not attempt to perform head turns while walking - declines to try during eval                    Vestibular Assessment -  10/17/20 0001       Vestibular Assessment   General  Observation Pt is an 80 yr old lady with c/o episodic vertigo which appears to be multi-factorial in etiology; pt reports most recent episode started in late July and has not improved since onset; pt states she is being referred to a neurologist by Dr. Quay Burow and also states that a MRI will be scheduled      Symptom Behavior   Subjective history of current problem Pt states this episode of vertigo started in July - some symptoms are consistent with BPPV as pt states she gets dizzy when she turns over onto her right side but she also c/o diplopia (has h/o of diplopia); pt states dizziness is worse in the morning and then she has it again when she is getting ready for bed at night    Type of Dizziness  Diplopia;Spinning;Lightheadedness;Vertigo    Frequency of Dizziness daily    Duration of Dizziness hours    Symptom Nature Positional;Motion provoked;Variable    Aggravating Factors Turning body quickly;Turning head quickly    Relieving Factors Comments   "Meclizine helps me function'   Progression of Symptoms No change since onset    History of similar episodes Sept. 2020 - pt received OP PT for vertigo from 11-06-18 until 01-23-19      Oculomotor Exam   Oculomotor Alignment Abnormal   Rt eye ptosis   Spontaneous Absent    Head shaking Horizontal Comment    Head Shaking Vertical --   pt declines this test   Smooth Pursuits Intact    Comment pt reports diplopia with smooth pursuit testing to Lt side      Oculomotor Exam-Fixation Suppressed    Left Head Impulse pt declines test due to dizziness will be provoked    Right Head Impulse pt declines test due to dizziness will be provoked      Positional Testing   Dix-Hallpike Dix-Hallpike Right;Dix-Hallpike Left    Sidelying Test Sidelying Left;Sidelying Right      Dix-Hallpike Right   Dix-Hallpike Right Duration approx. 20 secs    Dix-Hallpike Right Symptoms No nystagmus      Dix-Hallpike Left   Dix-Hallpike Left Duration approx. 15 secs     Dix-Hallpike Left Symptoms No nystagmus      Sidelying Right   Sidelying Right Duration approx. 10 secs    Sidelying Right Symptoms No nystagmus      Sidelying Left   Sidelying Left Duration none    Sidelying Left Symptoms No nystagmus      Positional Sensitivities   Supine to Left Side Mild dizziness    Supine to Right Side Moderate dizziness    Supine to Sitting Lightheadedness    Right Hallpike Mild dizziness    Up from Right Hallpike Mild dizziness    Up from Left Hallpike Lightheadedness                Objective measurements completed on examination: See above findings.                PT Education - 10/17/20 1113     Education Details eval results and POC - pt requests 1x/week and not 2x/wk at this time    Person(s) Educated Patient    Methods Explanation    Comprehension Verbalized understanding              PT Short Term Goals - 10/17/20 1134       PT SHORT TERM GOAL #  1   Title Pt will subjectively report at least 25% improvement in dizziness.    Time 4    Period Weeks    Status New    Target Date 11/14/20      PT SHORT TERM GOAL #2   Title Improve FGA score by at least 3 points from baseline for increased safety with amb.    Time 4    Period Weeks    Status New    Target Date 11/14/20      PT SHORT TERM GOAL #3   Title Independent in HEP for vestibular and balance exercises.    Time 4    Period Weeks    Status New    Target Date 11/14/20               PT Long Term Goals - 10/17/20 1138       PT LONG TERM GOAL #1   Title Pt will increase FOTO score from 45/100 DPS to >/= 54/100 to demo improvement in dizziness.    Time 8    Period Weeks    Status New    Target Date 12/12/20      PT LONG TERM GOAL #2   Title Pt will improve FGA by 5 points to reduce fall risk with ambulation and to demo improved balance.    Time 8    Period Weeks    Status New    Target Date 12/12/20      PT LONG TERM GOAL #3   Title Pt will  perform bed mobility including sit to both Rt and Lt sidelying, rolling and return to sitting with c/o dizziness </= 2/10 for all transitional movements.    Time 8    Period Weeks    Status New    Target Date 12/12/20      PT LONG TERM GOAL #4   Title Independent in updated HEP for balance and vestibular exercises.    Time 8    Period Weeks    Status New    Target Date 12/12/20                    Plan - 10/17/20 1122     Clinical Impression Statement Pt is an 80 yr old lady with h/o recurrent dizziness with pt reporting most recent onset of vertigo having started in late July 2022.  Pt's symptoms of vertigo appear to be partially positional as pt reports dizziness is worse early in the am and then occurs in the evening when she is getting ready for bed.  Pt reported dizziness with both Rt and Lt Dix-Hallpike tests but no nystagmus was noted with any positional testing.  Pt did report diplopia with smooth pursuit testing with Lt gaze; pt declined to attempt head turns, stating she "does not do that" due to provocation of dizziness.  Pt will benefit from PT to further assess and treat as indicated.    Personal Factors and Comorbidities Age;Comorbidity 2;Time since onset of injury/illness/exacerbation;Past/Current Experience    Comorbidities h/o vertigo, diplopia, DM type I, h/o MI, chronic CHF, OA, CAD, neurological gait dysfunction, OSA, B12 deficiency, chronic HA's, anxiety    Examination-Activity Limitations Bend;Bed Mobility;Locomotion Level;Stairs;Squat    Examination-Participation Restrictions Cleaning;Community Activity;Laundry;Shop;Meal Prep    Stability/Clinical Decision Making Evolving/Moderate complexity    Clinical Decision Making Moderate    Rehab Potential Good    PT Frequency 1x / week    PT Duration 8 weeks    PT  Treatment/Interventions ADLs/Self Care Home Management;Neuromuscular re-education;Gait training;Stair training;Therapeutic activities;Therapeutic  exercise;Balance training;Vestibular    PT Next Visit Plan continue to assess vertigo - recheck Dix-Hallpike tests for BPPV; do FGA:  initiate/review x1 viewing and habituation prn; balance training    PT Home Exercise Plan pt has exercises from previous admissions - will review    Consulted and Agree with Plan of Care Patient             Patient will benefit from skilled therapeutic intervention in order to improve the following deficits and impairments:  Dizziness, Difficulty walking, Decreased balance, Impaired sensation  Visit Diagnosis: Dizziness and giddiness - Plan: PT plan of care cert/re-cert  BPPV (benign paroxysmal positional vertigo), bilateral - Plan: PT plan of care cert/re-cert  Other abnormalities of gait and mobility - Plan: PT plan of care cert/re-cert  Unsteadiness on feet - Plan: PT plan of care cert/re-cert     Problem List Patient Active Problem List   Diagnosis Date Noted   Dysuria 09/09/2020   Anxiety 09/09/2020   Coronary artery disease    Pelvic pain 10/30/2019   Chest pain at rest 08/24/2018   Arthralgia 08/01/2017   COPD with asthma ([place]) 05/04/2017   Angina pectoris ([place]) 01/05/2017   Insomnia 11/23/2016   Chronic nonintractable headache 11/23/2016   Pancreatic insufficiency 10/28/2016   Carotid artery disease ([place]) 05/19/2016   Fatigue 05/19/2016   Anemia 03/27/2016   Type 1 diabetes mellitus ([place]) 07/21/2015   B12 deficiency-monthly B12 injections 03/31/2015   OSA (obstructive sleep apnea) with hypoxia 11/20/2014   Fecal incontinence 08/28/2014   Neurologic gait dysfunction 08/28/2014   Falls 08/28/2014   Neck pain of over 3 months duration 08/28/2014   H/O total knee replacement 02/20/2014   Obesity (BMI 30-39.9) 11/12/2013   Chronic diastolic CHF (congestive heart failure) ([place]) 11/17/2012   Meniscus, lateral, anterior horn derangement 11/10/2011   Medial meniscus, posterior horn derangement 11/10/2011   Osteoarthritis of right  knee 11/10/2011   Vertigo 10/05/2011   Labile hypertension 10/05/2011   CAD (coronary artery disease) 08/27/2010   MUSCLE CRAMPS 12/29/2009   History of myocardial infarction 11/19/2009   Extrinsic asthma 11/19/2009   GERD 11/19/2009   Cough 11/19/2009   Hyperlipidemia 11/18/2009    Alda Lea, PT 10/17/2020, 11:46 AM  [facility] [address] [place], [place], [address] Phone: [phone]   Fax:  [phone]  Name: JEMIMAH GUNST MRN: [id number] Date of Birth: 07-03-1940

## 2020-10-20 ENCOUNTER — Other Ambulatory Visit: Payer: Self-pay | Admitting: Internal Medicine

## 2020-10-21 ENCOUNTER — Other Ambulatory Visit: Payer: Medicare Other | Admitting: *Deleted

## 2020-10-21 ENCOUNTER — Other Ambulatory Visit: Payer: Self-pay

## 2020-10-21 ENCOUNTER — Other Ambulatory Visit: Payer: Self-pay | Admitting: Pulmonary Disease

## 2020-10-21 DIAGNOSIS — J449 Chronic obstructive pulmonary disease, unspecified: Secondary | ICD-10-CM

## 2020-10-21 DIAGNOSIS — E782 Mixed hyperlipidemia: Secondary | ICD-10-CM

## 2020-10-21 LAB — HEPATIC FUNCTION PANEL
ALT: 11 IU/L (ref 0–32)
AST: 19 IU/L (ref 0–40)
Albumin: 4.2 g/dL (ref 3.6–4.6)
Alkaline Phosphatase: 106 IU/L (ref 44–121)
Bilirubin Total: 0.2 mg/dL (ref 0.0–1.2)
Bilirubin, Direct: 0.11 mg/dL (ref 0.00–0.40)
Total Protein: 6.8 g/dL (ref 6.0–8.5)

## 2020-10-21 LAB — LIPID PANEL
Chol/HDL Ratio: 4 ratio (ref 0.0–4.4)
Cholesterol, Total: 108 mg/dL (ref 100–199)
HDL: 27 mg/dL — ABNORMAL LOW (ref >39)
LDL Chol Calc (NIH): 58 mg/dL (ref 0–99)
Triglycerides: 125 mg/dL (ref 0–149)
VLDL Cholesterol Cal: 23 mg/dL (ref 5–40)

## 2020-10-31 ENCOUNTER — Other Ambulatory Visit: Payer: Self-pay

## 2020-10-31 ENCOUNTER — Ambulatory Visit: Payer: Medicare Other | Admitting: Physical Therapy

## 2020-10-31 ENCOUNTER — Ambulatory Visit: Payer: Medicare Other | Admitting: Nurse Practitioner

## 2020-10-31 VITALS — BP 145/88 | HR 78

## 2020-10-31 DIAGNOSIS — R42 Dizziness and giddiness: Secondary | ICD-10-CM | POA: Diagnosis not present

## 2020-10-31 DIAGNOSIS — R2681 Unsteadiness on feet: Secondary | ICD-10-CM

## 2020-10-31 DIAGNOSIS — R2689 Other abnormalities of gait and mobility: Secondary | ICD-10-CM

## 2020-10-31 NOTE — Patient Instructions (Signed)
Hello Dr. Quay Burow. [name] was evaluated for vestibular rehab.  In the past, vestibular rehab was effective but she is now presenting with diplopia and we aren't able to do effective gaze stabilization because of the diplopia.    I think it would be helpful right now to place [name] on hold for vestibular rehab and have her vision evaluated by a Neuro-Optometrist at [facility].    If you agree, would you please send a referral for evaluation and possible vision rehab?  Thank you! Rico Junker, PT, DPT 10/31/20    12:36 PM

## 2020-10-31 NOTE — Therapy (Signed)
[facility] [address] [place], [place], [address] Phone: [phone]   Fax:  [phone]  Physical Therapy Treatment  Patient Details  Name: [name] MRN: [id number] Date of Birth: 1940/02/09 Referring Provider (PT): Dr. Billey Gosling   Encounter Date: 10/31/2020   PT End of Session - 10/31/20 1154     Visit Number 2    Number of Visits 9    Date for PT Re-Evaluation 12/12/20    Authorization Type [employer] Medicare    Authorization Time Period 10-16-20 - 12-16-20    PT Start Time 1152    PT Stop Time 9381    PT Time Calculation (min) 43 min    Activity Tolerance Patient tolerated treatment well    Behavior During Therapy [facility] for tasks assessed/performed             Past Medical History:  Diagnosis Date   Adenomatous colon polyp    Allergy    Anxiety    Asthma    Carotid artery disease ([place])    carotid US 02/2017: bilat ICA 1-39%   Chronic diastolic CHF    Echo 0/1751: EF 65-70, Gr 2 DD, mild MS (mean 5), PASP 44   COPD    pt is unsure if has been officially diagnosed   Coronary artery disease    CABG '09- cathed 12/09, 9/10, 6/11, 3/14 and 12/13/16- medical Rx // cath 12/2016 - 2/3 grafts patent >> med Rx // Myoview 12/17: low risk   Diabetes mellitus    Gastroesophageal reflux disease    Hiatal hernia    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   Hyperlipidemia    Hypertension    PONV (postoperative nausea and vomiting)    Schatzki's ring    Shoulder injury    resolved after shoulder surgery   Sleep apnea    not on cpap    Past Surgical History:  Procedure Laterality Date   ABDOMINAL HYSTERECTOMY     APPENDECTOMY     came out with Hysterectomy   CARDIAC CATHETERIZATION  07/23/2009   EF 60%   CARDIAC CATHETERIZATION  10/11/2008   CARDIAC CATHETERIZATION  03/01/2007   EF 75-80%   CARDIOVASCULAR STRESS TEST  11/15/2007   EF 60%   COLONOSCOPY     CORONARY ARTERY BYPASS GRAFT     SEVERELY  DISEASED SAPHENOUS VEIN GRAFT TO THE RIGHT CORONARY ARTERY BUT WITH FAIRLY WELL PRESERVED FLOW TO THE DISTAL RIGHT CORONARY ARTERY FROM THE NATIVE CIRCULATION-RESTART  CATH IN JUNE 2000, REVEALS MILD/MODERATE  CAD WITH GOOD FLOW DOWN HER LAD   ESOPHAGOGASTRODUODENOSCOPY     EYE SURGERY     bilateral cataract surgery with lens implant   LEFT HEART CATH AND CORS/GRAFTS ANGIOGRAPHY N/A 12/14/2016   Procedure: LEFT HEART CATH AND CORS/GRAFTS ANGIOGRAPHY;  Surgeon: Jettie Booze, MD;  Location: [facility];  Service: Cardiovascular;  Laterality: N/A;   lense removal Left    POLYPECTOMY     ROTATOR CUFF REPAIR     right and left   TONSILLECTOMY     age 23   TOTAL KNEE ARTHROPLASTY Right 02/20/2014   Procedure: RIGHT TOTAL KNEE ARTHROPLASTY;  Surgeon: Tobi Bastos, MD;  Location: [facility];  Service: Orthopedics;  Laterality: Right;   tumor removed kidney     UPPER GASTROINTESTINAL ENDOSCOPY     US ECHOCARDIOGRAPHY  03/08/2008   EF 55-60%    Vitals:   10/31/20 1159  BP: (!) 145/88  Pulse: 78  SpO2: 93%     Subjective Assessment - 10/31/20 1155     Subjective No HA today.  Has been having significant dizziness for the past two days and increased edema in bilat LE.  Edema persists even with LE elevated.  Feet hurt, is going to MD after therapy appointment today.  Very tender to palpation in feet.    Pertinent History h/o vertigo; DM type I, h/o MI, chronic CHF, OA, CAD, neurological gait dysfunction, OSA, B12 deficiency, chronic HA's, anxiety    Limitations Walking;House hold activities    Patient Stated Goals resolve the vertigo - get rid of the headache    Pain Onset More than a month ago                     Vestibular Assessment - 10/31/20 1203       Symptom Behavior   Subjective history of current problem Reports that when she is walking or moving her head a lot she has double vision.      Oculomotor Exam   Oculomotor Alignment Abnormal    Comment  Cover-uncover test - diplopia with uncover, but resolves with covering L eye.  When R eye covered or uncovered pt reported diplopia.  Exophoria noted.      Positional Testing   Dix-Hallpike Dix-Hallpike Right;Dix-Hallpike Left      Dix-Hallpike Right   Dix-Hallpike Right Duration 0    Dix-Hallpike Right Symptoms No nystagmus      Dix-Hallpike Left   Dix-Hallpike Left Duration 0    Dix-Hallpike Left Symptoms No nystagmus                       Vestibular Treatment/Exercise - 10/31/20 1208       Vestibular Treatment/Exercise   Vestibular Treatment Provided Gaze    Gaze Exercises X1 Viewing Horizontal;X1 Viewing Vertical      X1 Viewing Horizontal   Foot Position Seated, L eye glass lens covered due to diplopia with head turn to L.    Comments 60 seconds; no diplopia with one eye covered.   Later when testing eye alignment pt did report diplopia with L eye covered      X1 Viewing Vertical   Foot Position L eye covered still having diplopia; diplopia with R eye covered.                    PT Education - 10/31/20 2209     Education Details Discussed how diplopia affects ability to perform and effectiveness of VOR due to unable to stabilize gaze; pt stating she will be going back to PCP today.  Sent note with pt explaining findings and requesting referral to neuro-optometrist.  Will place VOR on hold for now until vision can be assessed more thoroughly.    Person(s) Educated Patient    Methods Explanation    Comprehension Verbalized understanding              PT Short Term Goals - 10/17/20 1134       PT SHORT TERM GOAL #1   Title Pt will subjectively report at least 25% improvement in dizziness.    Time 4    Period Weeks    Status New    Target Date 11/14/20      PT SHORT TERM GOAL #2   Title Improve FGA score by at least 3 points from baseline for increased safety with amb.  Time 4    Period Weeks    Status New    Target Date 11/14/20       PT SHORT TERM GOAL #3   Title Independent in HEP for vestibular and balance exercises.    Time 4    Period Weeks    Status New    Target Date 11/14/20               PT Long Term Goals - 10/17/20 1138       PT LONG TERM GOAL #1   Title Pt will increase FOTO score from 45/100 DPS to >/= 54/100 to demo improvement in dizziness.    Time 8    Period Weeks    Status New    Target Date 12/12/20      PT LONG TERM GOAL #2   Title Pt will improve FGA by 5 points to reduce fall risk with ambulation and to demo improved balance.    Time 8    Period Weeks    Status New    Target Date 12/12/20      PT LONG TERM GOAL #3   Title Pt will perform bed mobility including sit to both Rt and Lt sidelying, rolling and return to sitting with c/o dizziness </= 2/10 for all transitional movements.    Time 8    Period Weeks    Status New    Target Date 12/12/20      PT LONG TERM GOAL #4   Title Independent in updated HEP for balance and vestibular exercises.    Time 8    Period Weeks    Status New    Target Date 12/12/20                   Plan - 10/31/20 2159     Clinical Impression Statement Performed re-assessment of dix-hallpke for R and L side, no evidence of vertigo or nystagmus with positional testing.  Did not perform FGA testing today due to significant increase in LE edema and foot pain.  Attempted to review and update patient's HEP.  When performing VOR x1 viewing pt experienced diplopia with head turns to L.  Pt also reported diplopia with monocular and binocular vision.  Due to pt experiencing diplopia, not able to perform effective gaze stabilization training at this time.  Pt to go see PCP today; note sent with pt requesting referral to neuro-optometrist for further assessment of diplopia.  Did not re-assign VOR for HEP at this time.    Personal Factors and Comorbidities Age;Comorbidity 2;Time since onset of injury/illness/exacerbation;Past/Current Experience     Comorbidities h/o vertigo, diplopia, DM type I, h/o MI, chronic CHF, OA, CAD, neurological gait dysfunction, OSA, B12 deficiency, chronic HA's, anxiety    Examination-Activity Limitations Bend;Bed Mobility;Locomotion Level;Stairs;Squat    Examination-Participation Restrictions Cleaning;Community Activity;Laundry;Shop;Meal Prep    Stability/Clinical Decision Making Evolving/Moderate complexity    Rehab Potential Good    PT Frequency 1x / week    PT Duration 8 weeks    PT Treatment/Interventions ADLs/Self Care Home Management;Neuromuscular re-education;Gait training;Stair training;Therapeutic activities;Therapeutic exercise;Balance training;Vestibular    PT Next Visit Plan How are her feet, still swollen? Did Dr. Quay Burow put in referral to neuro-optometrist?  Not able to do VOR due to diplopia.  If feet better, assess FGA.  HEP: balance and habituation    Consulted and Agree with Plan of Care Patient             Patient will benefit from  skilled therapeutic intervention in order to improve the following deficits and impairments:  Dizziness, Difficulty walking, Decreased balance, Impaired sensation  Visit Diagnosis: Dizziness and giddiness  Other abnormalities of gait and mobility  Unsteadiness on feet     Problem List Patient Active Problem List   Diagnosis Date Noted   Dysuria 09/09/2020   Anxiety 09/09/2020   Coronary artery disease    Pelvic pain 10/30/2019   Chest pain at rest 08/24/2018   Arthralgia 08/01/2017   COPD with asthma ([place]) 05/04/2017   Angina pectoris ([place]) 01/05/2017   Insomnia 11/23/2016   Chronic nonintractable headache 11/23/2016   Pancreatic insufficiency 10/28/2016   Carotid artery disease ([place]) 05/19/2016   Fatigue 05/19/2016   Anemia 03/27/2016   Type 1 diabetes mellitus ([place]) 07/21/2015   B12 deficiency-monthly B12 injections 03/31/2015   OSA (obstructive sleep apnea) with hypoxia 11/20/2014   Fecal incontinence 08/28/2014   Neurologic gait  dysfunction 08/28/2014   Falls 08/28/2014   Neck pain of over 3 months duration 08/28/2014   H/O total knee replacement 02/20/2014   Obesity (BMI 30-39.9) 11/12/2013   Chronic diastolic CHF (congestive heart failure) ([place]) 11/17/2012   Meniscus, lateral, anterior horn derangement 11/10/2011   Medial meniscus, posterior horn derangement 11/10/2011   Osteoarthritis of right knee 11/10/2011   Vertigo 10/05/2011   Labile hypertension 10/05/2011   CAD (coronary artery disease) 08/27/2010   MUSCLE CRAMPS 12/29/2009   History of myocardial infarction 11/19/2009   Extrinsic asthma 11/19/2009   GERD 11/19/2009   Cough 11/19/2009   Hyperlipidemia 11/18/2009    Rico Junker, PT, DPT 10/31/20    10:11 PM    [facility] [address] [place], [place], [address] Phone: [phone]   Fax:  [phone]  Name: [name] MRN: [id number] Date of Birth: Jun 05, 1940

## 2020-11-03 ENCOUNTER — Other Ambulatory Visit: Payer: Self-pay | Admitting: Pulmonary Disease

## 2020-11-03 DIAGNOSIS — J449 Chronic obstructive pulmonary disease, unspecified: Secondary | ICD-10-CM

## 2020-11-05 ENCOUNTER — Other Ambulatory Visit: Payer: Self-pay | Admitting: Internal Medicine

## 2020-11-05 ENCOUNTER — Encounter: Payer: Self-pay | Admitting: Internal Medicine

## 2020-11-06 ENCOUNTER — Ambulatory Visit: Payer: Medicare Other | Admitting: Physical Therapy

## 2020-11-06 ENCOUNTER — Other Ambulatory Visit: Payer: Self-pay | Admitting: Internal Medicine

## 2020-11-06 DIAGNOSIS — H532 Diplopia: Secondary | ICD-10-CM

## 2020-11-06 DIAGNOSIS — R42 Dizziness and giddiness: Secondary | ICD-10-CM

## 2020-11-06 NOTE — Progress Notes (Deleted)
NEUROLOGY FOLLOW UP OFFICE NOTE  [name] [id number]  Assessment/Plan:   ***  Subjective:  [name] is a 80 year old woman with coronary artery disease, hypertension, type 2 diabetes mellitus, hyperlipidemia, B12 deficiency and obstructive sleep apnea whom I previously saw for migraines presents today for dizziness.  History supplemented by PCP's note.  She has longstanding history of intermittent vertigo.  Usually ***.  She had a recurrence ***.  Has tried meclizine, diazepam and vestibular rehab.    MRI of brain without contrast on 08/24/2020 personally reviewed showed empty sella but overall unremarkable.  Current NSAIDS:  ASA 60m Current analgesics:  Tylenol Current triptans:  no Current ergotamine:  no Current anti-emetic:  Promethazine 239mCurrent muscle relaxants:  Baclofen 2042mwice daily Current anti-anxiolytic:  Klonopin Current sleep aide:  trazodone Current Antihypertensive medications:  Toprol XL 33m56mardizem, Lasix, Imdur Current Antidepressant medications:  Nortriptyline 75 mg Current Anticonvulsant medications:  no Current anti-CGRP:  no Current Vitamins/Herbal/Supplements:  B12 Current Antihistamines/Decongestants:  Allegra, Singulair Other therapy:  ice pack  HISTORY: Migraines: Onset:  She has history of migraines as a young woman which resolved after having children.  Current headaches started in 2016. Location:  She has constant holocephalic headache.  She has severe headache radiating from the base of her neck on the right up Quality:  pounding Initial Intensity:  10/10 Aura:  no Prodrome:  no Postdrome:  no Associated symptoms:  None.  No nausea, vomiting, photophobia, phonophobia or visual disturbance.  She has not had any new worse headache of her life, waking up from sleep Initial Duration:  All day Initial Frequency:  Daily headache, severe  Headache every other day Initial Frequency of abortive medication: daily Triggers:   None Relieving factors:  None Activity:  aggravates   Past NSAIDS:  Ibuprofen, naproxen (caused increased heart rate) Past analgesics:  Excedrin, tramadol and other opioids (adverse reaction) Past abortive triptans:  no Past muscle relaxants:  Robaxin, Flexeril Past anti-emetic:  Promethazine (effective) Past antihypertensive medications:  losartan Past antidepressant medications:  no Past anticonvulsant medications:  Gabapentin 200mg76mowsiness) Past vitamins/Herbal/Supplements:  CoQ10 Past antihistamines/decongestants:  no Other past therapy:  Physical therapy of neck.   Family history of headache:  father   MRI of brain without contrast from 09/16/14 was personally reviewed and revealed partially empty sella but otherwise unremarkable.   MRI of cervical spine from 09/16/14 was personally reviewed and demonstrated multilevel degenerative changes including left greater than right spurring at C3-C4 causing left foraminal narrowing, severe spurring at C4-C5 causing right moderate foraminal narrowing, and disc bulg and spurring at C6-C7 with moderate bilateral foraminal narrowing.  She was evaluated by Dr. RamosNelva Bushinterventional pain (epidural or facet joint injections).  He didn't think it was a good idea given that she would have to stop Plavix.  PAST MEDICAL HISTORY: Past Medical History:  Diagnosis Date   Adenomatous colon polyp    Allergy    Anxiety    Asthma    [place] bilat ICA 1-39%   Chronic diastolic CHF    Echo 02/2015/829565-70, Gr 2 DD, mild MS (mean 5), PASP 44   COPD    pt is unsure if has been officially diagnosed   Coronary artery disease    CABG '09- cathed 12/09, 9/10, 6/11, 3/14 and 12/13/16- medical Rx // cath 12/2016 - 2/3 grafts patent >> med Rx // Myoview 12/17: low risk   Diabetes  mellitus    Gastroesophageal reflux disease    Hiatal hernia    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   Hyperlipidemia     Hypertension    PONV (postoperative nausea and vomiting)    Schatzki's ring    Shoulder injury    resolved after shoulder surgery   Sleep apnea    not on cpap    MEDICATIONS: Current Outpatient Medications on File Prior to Visit  Medication Sig Dispense Refill   albuterol (PROVENTIL HFA;VENTOLIN HFA) 108 (90 BASE) MCG/ACT inhaler Inhale 2 puffs into the lungs every 6 (six) hours as needed. For shortness of breath. 1 Inhaler 0   albuterol (PROVENTIL) (2.5 MG/3ML) 0.083% nebulizer solution USE 1 VIAL IN NEBULIZER 4 TIMES DAILY (Patient taking differently: Take 2.5 mg by nebulization 4 (four) times daily.) 360 mL 1   alum & mag hydroxide-simeth (MAALOX/MYLANTA) 200-200-20 MG/5ML suspension Take 30 mLs as needed by mouth for indigestion or heartburn.     aspirin EC 81 MG tablet Take 1 tablet (81 mg total) by mouth daily.     baclofen (LIORESAL) 20 MG tablet Take 2 tablets (40 mg total) by mouth 2 (two) times daily. Due for f/u appt. 360 tablet 2   Bempedoic Acid-Ezetimibe (NEXLIZET) 180-10 MG TABS Take 1 tablet by mouth daily. 90 tablet 3   Blood Glucose Monitoring Suppl (ONE TOUCH ULTRA MINI) w/Device KIT 3 (three) times daily. for testing  0   calcium carbonate (OS-CAL) 1250 (500 Ca) MG chewable tablet Chew 1 tablet by mouth as needed for heartburn.     calcium carbonate (TUMS - DOSED IN MG ELEMENTAL CALCIUM) 500 MG chewable tablet Chew 2 tablets daily as needed by mouth for indigestion or heartburn.     Cholecalciferol (VITAMIN D) 2000 UNITS tablet Take 2,000 Units by mouth daily.     clopidogrel (PLAVIX) 75 MG tablet Take 75 mg by mouth daily.     Continuous Blood Gluc Sensor (FREESTYLE LIBRE 14 DAY SENSOR) MISC See admin instructions.     Cyanocobalamin (VITAMIN B-12 IJ) Inject 1,000 mcg as directed every 30 (thirty) days.     cyclobenzaprine (FLEXERIL) 5 MG tablet Take 5 mg by mouth as needed for muscle spasms.     diazepam (VALIUM) 5 MG tablet Take 1 tablet (5 mg total) by mouth daily.  30 tablet 5   diclofenac sodium (VOLTAREN) 1 % GEL Apply 2 g topically 4 (four) times daily as needed (muscle pain).     diltiazem (CARDIZEM CD) 300 MG 24 hr capsule TAKE 1 CAPSULE (300 MG TOTAL) BY MOUTH DAILY. MUST KEEP SCHEDULED APPT FOR FUTURE REFILLS 90 capsule 1   famotidine (PEPCID) 40 MG tablet Take 1 tablet (40 mg total) by mouth daily as needed. (Patient taking differently: Take 40 mg by mouth daily as needed for heartburn or indigestion.) 90 tablet 1   fexofenadine (ALLEGRA) 180 MG tablet TAKE 1 TABLET BY MOUTH EVERY DAY (Patient taking differently: Take 180 mg by mouth as needed for allergies.) 90 tablet 3   Fluticasone-Salmeterol (ADVAIR DISKUS) 250-50 MCG/DOSE AEPB Inhale 1 puff into the lungs 2 (two) times daily. (Patient taking differently: Inhale 1 puff into the lungs as needed (shortness of breath).) 60 each 5   furosemide (LASIX) 40 MG tablet Take 1 tablet (40 mg total) by mouth daily. 90 tablet 3   insulin lispro protamine-insulin lispro (HUMALOG 75/25) (75-25) 100 UNIT/ML SUSP Inject 15-30 Units into the skin See admin instructions. Inject 15 units  in the morning, 5 units at lunch, and 30 units at dinner.     isosorbide mononitrate (IMDUR) 60 MG 24 hr tablet Take one tablet ( 60 mg ) twice daily 8 hours apart. (Patient taking differently: Take 60 mg by mouth 2 (two) times daily. Take 8 hours apart.) 180 tablet 3   lisinopril (ZESTRIL) 20 MG tablet Take 20 mg by mouth daily.     loperamide (IMODIUM) 2 MG capsule Take 2 mg by mouth as needed for diarrhea or loose stools.      meclizine (ANTIVERT) 25 MG tablet TAKE ONE TABLET BY MOUTH TWICE DAILY AS NEEDED FOR DIZZINESS (Patient taking differently: Take 25 mg by mouth as needed for dizziness.) 30 tablet 0   metoprolol succinate (TOPROL-XL) 100 MG 24 hr tablet TAKE ONE TABLET BY MOUTH EVERY DAY (Patient taking differently: Take 100 mg by mouth daily.) 90 tablet 3   Multiple Vitamin (MULTIVITAMIN WITH MINERALS) TABS Take 1 tablet by  mouth daily.     Nebulizers (COMPRESSOR/NEBULIZER) MISC Use with albuterol 1 each 0   nitroGLYCERIN (NITROSTAT) 0.4 MG SL tablet Place 0.4 mg under the tongue every 5 (five) minutes as needed for chest pain.     nortriptyline (PAMELOR) 50 MG capsule TAKE 2 CAPSULES (100 MG TOTAL) BY MOUTH AT BEDTIME. 180 capsule 1   ONE TOUCH ULTRA TEST test strip 1 each by Other route 2 (two) times daily.  3   ONETOUCH DELICA LANCETS 66A MISC 3 (three) times daily. for testing  0   Spacer/Aero-Holding Chambers (AEROCHAMBER MV) inhaler Use as instructed 1 each 0   spironolactone (ALDACTONE) 25 MG tablet Take 1 tablet (25 mg total) by mouth daily. 90 tablet 3   Study - ORION 4 - inclisiran 300 mg/1.30m or placebo SQ injection (PI-Stuckey) Inject 300 mg into the skin every 6 (six) months.     Tiotropium Bromide Monohydrate (SPIRIVA RESPIMAT) 2.5 MCG/ACT AERS Inhale 2 puffs into the lungs daily. 4 g 6   Current Facility-Administered Medications on File Prior to Visit  Medication Dose Route Frequency Provider Last Rate Last Admin   cyanocobalamin ((VITAMIN B-12)) injection 1,000 mcg  1,000 mcg Intramuscular Q30 days BBinnie Rail MD   1,000 mcg at 03/26/16 1658    ALLERGIES: Allergies  Allergen Reactions   Amlodipine Other (See Comments)    hallucinations    Banana Nausea And Vomiting    Stomach pumped   Co Q10 [Coenzyme Q10] Other (See Comments)    Body cramps   Codeine Other (See Comments)    Hallucinate, loose identity and don't know who I am   Metformin And Related Nausea And Vomiting   Morphine Other (See Comments)    Can not function, it immobilizes me    Pentazocine Nausea And Vomiting   Pravastatin Other (See Comments)    Hands locked up   Repatha [Evolocumab] Other (See Comments)    myalgias   Statins Other (See Comments)    Muscle cramps   Sulfa Antibiotics Swelling   Sulfonamide Derivatives Swelling   Tramadol Hcl Other (See Comments)    FAMILY HISTORY: Family History  Problem  Relation Age of Onset   Heart disease Maternal Grandfather    Heart failure Maternal Grandfather    Diabetes Maternal Grandfather    Heart attack Father    Diabetes Mother    Rheum arthritis Sister    Emphysema Paternal Uncle    Esophageal cancer Brother 324      she said he  was born with it   Emphysema Paternal Aunt    Healthy Child    Neuropathy Neg Hx    Multiple sclerosis Neg Hx    Colon cancer Neg Hx    Colon polyps Neg Hx    Rectal cancer Neg Hx    Stomach cancer Neg Hx       Objective:  *** General: No acute distress.  Patient appears ***-groomed.   Head:  Normocephalic/atraumatic Eyes:  Fundi examined but not visualized Neck: supple, no paraspinal tenderness, full range of motion Heart:  Regular rate and rhythm Lungs:  Clear to auscultation bilaterally Back: No paraspinal tenderness Neurological Exam: alert and oriented to person, place, and time.  Speech fluent and not dysarthric, language intact.  CN II-XII intact. Bulk and tone normal, muscle strength 5/5 throughout.  Sensation to light touch intact.  Deep tendon reflexes 2+ throughout, toes downgoing.  Finger to nose testing intact.  Gait normal, Romberg negative.   [name] Clines, DO  CC: ***

## 2020-11-07 ENCOUNTER — Ambulatory Visit: Payer: Medicare Other | Admitting: Neurology

## 2020-11-07 ENCOUNTER — Ambulatory Visit: Payer: Medicare Other | Admitting: Nurse Practitioner

## 2020-11-10 ENCOUNTER — Telehealth: Payer: Self-pay

## 2020-11-10 ENCOUNTER — Other Ambulatory Visit: Payer: Self-pay | Admitting: Internal Medicine

## 2020-11-10 NOTE — Telephone Encounter (Signed)
[facility] neuro called to see if pt was suppose to have a referral for neuro or neuro opt because if needing neuro opt there are forms needing to be filled out.   [phone] Gus Height)

## 2020-11-11 ENCOUNTER — Ambulatory Visit: Payer: Medicare Other | Admitting: Cardiovascular Disease

## 2020-11-11 ENCOUNTER — Ambulatory Visit (INDEPENDENT_AMBULATORY_CARE_PROVIDER_SITE_OTHER): Payer: Medicare Other | Admitting: Internal Medicine

## 2020-11-11 ENCOUNTER — Other Ambulatory Visit: Payer: Self-pay

## 2020-11-11 ENCOUNTER — Encounter: Payer: Self-pay | Admitting: Internal Medicine

## 2020-11-11 VITALS — BP 134/72 | HR 82 | Temp 98.9°F | Ht 62.0 in | Wt 194.0 lb

## 2020-11-11 DIAGNOSIS — M25571 Pain in right ankle and joints of right foot: Secondary | ICD-10-CM | POA: Diagnosis not present

## 2020-11-11 DIAGNOSIS — M25572 Pain in left ankle and joints of left foot: Secondary | ICD-10-CM | POA: Insufficient documentation

## 2020-11-11 DIAGNOSIS — M7989 Other specified soft tissue disorders: Secondary | ICD-10-CM

## 2020-11-11 DIAGNOSIS — M109 Gout, unspecified: Secondary | ICD-10-CM | POA: Diagnosis not present

## 2020-11-11 DIAGNOSIS — R0989 Other specified symptoms and signs involving the circulatory and respiratory systems: Secondary | ICD-10-CM

## 2020-11-11 DIAGNOSIS — E1059 Type 1 diabetes mellitus with other circulatory complications: Secondary | ICD-10-CM

## 2020-11-11 LAB — CBC WITH DIFFERENTIAL/PLATELET
Basophils Absolute: 0.1 10*3/uL (ref 0.0–0.1)
Basophils Relative: 0.9 % (ref 0.0–3.0)
Eosinophils Absolute: 0.1 10*3/uL (ref 0.0–0.7)
Eosinophils Relative: 2.1 % (ref 0.0–5.0)
HCT: 37 % (ref 36.0–46.0)
Hemoglobin: 11.9 g/dL — ABNORMAL LOW (ref 12.0–15.0)
Lymphocytes Relative: 33.7 % (ref 12.0–46.0)
Lymphs Abs: 2.1 10*3/uL (ref 0.7–4.0)
MCHC: 32.1 g/dL (ref 30.0–36.0)
MCV: 84 fl (ref 78.0–100.0)
Monocytes Absolute: 0.5 10*3/uL (ref 0.1–1.0)
Monocytes Relative: 8.3 % (ref 3.0–12.0)
Neutro Abs: 3.4 10*3/uL (ref 1.4–7.7)
Neutrophils Relative %: 55 % (ref 43.0–77.0)
Platelets: 323 10*3/uL (ref 150.0–400.0)
RBC: 4.41 Mil/uL (ref 3.87–5.11)
RDW: 14.7 % (ref 11.5–15.5)
WBC: 6.2 10*3/uL (ref 4.0–10.5)

## 2020-11-11 LAB — HEPATIC FUNCTION PANEL
ALT: 10 U/L (ref 0–35)
AST: 17 U/L (ref 0–37)
Albumin: 4.2 g/dL (ref 3.5–5.2)
Alkaline Phosphatase: 100 U/L (ref 39–117)
Bilirubin, Direct: 0.1 mg/dL (ref 0.0–0.3)
Total Bilirubin: 0.4 mg/dL (ref 0.2–1.2)
Total Protein: 7.6 g/dL (ref 6.0–8.3)

## 2020-11-11 LAB — BASIC METABOLIC PANEL
BUN: 22 mg/dL (ref 6–23)
CO2: 34 mEq/L — ABNORMAL HIGH (ref 19–32)
Calcium: 9.6 mg/dL (ref 8.4–10.5)
Chloride: 102 mEq/L (ref 96–112)
Creatinine, Ser: 0.84 mg/dL (ref 0.40–1.20)
GFR: 64.85 mL/min (ref >60.00)
Glucose, Bld: 127 mg/dL — ABNORMAL HIGH (ref 70–99)
Potassium: 4.2 mEq/L (ref 3.5–5.1)
Sodium: 143 mEq/L (ref 135–145)

## 2020-11-11 LAB — URIC ACID: Uric Acid, Serum: 6.3 mg/dL (ref 2.4–7.0)

## 2020-11-11 LAB — BRAIN NATRIURETIC PEPTIDE: Pro B Natriuretic peptide (BNP): 121 pg/mL — ABNORMAL HIGH (ref 0.0–100.0)

## 2020-11-11 MED ORDER — METHYLPREDNISOLONE ACETATE 80 MG/ML IJ SUSP
80.0000 mg | Freq: Once | INTRAMUSCULAR | Status: AC
  Administered 2020-11-11: 80 mg via INTRAMUSCULAR

## 2020-11-11 MED ORDER — METHYLPREDNISOLONE 4 MG PO TBPK: ORAL_TABLET | ORAL | 0 refills | Status: DC

## 2020-11-11 MED ORDER — TRAMADOL HCL 50 MG PO TABS: 50.0000 mg | ORAL_TABLET | Freq: Four times a day (QID) | ORAL | 0 refills | Status: DC | PRN

## 2020-11-11 NOTE — Progress Notes (Signed)
Patient ID: [name], female   DOB: 05-22-1940, 80 y.o.   MRN: [id number]        Chief Complaint: follow up bilateral ankle pain and swelling       HPI:  [name] is a 80 y.o. female here with c/o 2 wks onset pain and swelling to both ankles, has tried to see UC but only had a CXR to r/o cxr per pt.  ED wait too long, and unable to access care here until now.  No fever, trauma.  Oveall pain somewhat improved in last 2-3 days but still 7/10.  Worse to walk, better to sit.  No hx of gout.  Pt denies chest pain, increased sob or doe, wheezing, orthopnea, PND, increased LE swelling, palpitations, dizziness or syncope.   Pt denies polydipsia, polyuria, or new focal neuro s/s.         Wt Readings from Last 3 Encounters:  11/11/20 194 lb (88 kg)  10/10/20 191 lb 6.4 oz (86.8 kg)  09/29/20 189 lb 6.4 oz (85.9 kg)   BP Readings from Last 3 Encounters:  11/13/20 (!) 150/90  11/11/20 134/72  10/31/20 (!) 145/88         Past Medical History:  Diagnosis Date   Adenomatous colon polyp    Allergy    Anxiety    Asthma    Carotid artery disease ([place])    carotid US 02/2017: bilat ICA 1-39%   Chronic diastolic CHF    Echo 09/8278: EF 65-70, Gr 2 DD, mild MS (mean 5), PASP 44   COPD    pt is unsure if has been officially diagnosed   Coronary artery disease    CABG '09- cathed 12/09, 9/10, 6/11, 3/14 and 12/13/16- medical Rx // cath 12/2016 - 2/3 grafts patent >> med Rx // Myoview 12/17: low risk   Diabetes mellitus    Gastroesophageal reflux disease    Hiatal hernia    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   Hyperlipidemia    Hypertension    PONV (postoperative nausea and vomiting)    Schatzki's ring    Shoulder injury    resolved after shoulder surgery   Sleep apnea    not on cpap   Past Surgical History:  Procedure Laterality Date   ABDOMINAL HYSTERECTOMY     APPENDECTOMY     came out with Hysterectomy   CARDIAC CATHETERIZATION  07/23/2009   EF 60%   CARDIAC  CATHETERIZATION  10/11/2008   CARDIAC CATHETERIZATION  03/01/2007   EF 75-80%   CARDIOVASCULAR STRESS TEST  11/15/2007   EF 60%   COLONOSCOPY     CORONARY ARTERY BYPASS GRAFT     SEVERELY DISEASED SAPHENOUS VEIN GRAFT TO THE RIGHT CORONARY ARTERY BUT WITH FAIRLY WELL PRESERVED FLOW TO THE DISTAL RIGHT CORONARY ARTERY FROM THE NATIVE CIRCULATION-RESTART  CATH IN JUNE 2000, REVEALS MILD/MODERATE  CAD WITH GOOD FLOW DOWN HER LAD   ESOPHAGOGASTRODUODENOSCOPY     EYE SURGERY     bilateral cataract surgery with lens implant   LEFT HEART CATH AND CORS/GRAFTS ANGIOGRAPHY N/A 12/14/2016   Procedure: LEFT HEART CATH AND CORS/GRAFTS ANGIOGRAPHY;  Surgeon: Jettie Booze, MD;  Location: [facility];  Service: Cardiovascular;  Laterality: N/A;   lense removal Left    POLYPECTOMY     ROTATOR CUFF REPAIR     right and left   TONSILLECTOMY     age 64   TOTAL KNEE ARTHROPLASTY  Right 02/20/2014   Procedure: RIGHT TOTAL KNEE ARTHROPLASTY;  Surgeon: Tobi Bastos, MD;  Location: [facility];  Service: Orthopedics;  Laterality: Right;   tumor removed kidney     UPPER GASTROINTESTINAL ENDOSCOPY     US ECHOCARDIOGRAPHY  03/08/2008   EF 55-60%    reports that she quit smoking about 44 years ago. Her smoking use included cigarettes. She started smoking about 65 years ago. She has a 10.50 pack-year smoking history. She has never used smokeless tobacco. She reports that she does not drink alcohol and does not use drugs. family history includes Diabetes in her maternal grandfather and mother; Emphysema in her paternal aunt and paternal uncle; Esophageal cancer (age of onset: 6) in her brother; Healthy in her child; Heart attack in her father; Heart disease in her maternal grandfather; Heart failure in her maternal grandfather; Rheum arthritis in her sister. Allergies  Allergen Reactions   Amlodipine Other (See Comments)    hallucinations    Banana Nausea And Vomiting    Stomach pumped   Co Q10 [Coenzyme  Q10] Other (See Comments)    Body cramps   Codeine Other (See Comments)    Hallucinate, loose identity and don't know who I am   Insulin Aspart     Other reaction(s): Unknown   Lisinopril     Other reaction(s): nose bleed   Metformin And Related Nausea And Vomiting   Morphine Other (See Comments)    Can not function, it immobilizes me    Pentazocine Nausea And Vomiting   Pravastatin Other (See Comments)    Hands locked up   Repatha [Evolocumab] Other (See Comments)    myalgias   Statins Other (See Comments)    Muscle cramps   Sulfa Antibiotics Swelling   Sulfonamide Derivatives Swelling   Tramadol Hcl Other (See Comments)   Current Outpatient Medications on File Prior to Visit  Medication Sig Dispense Refill   albuterol (PROVENTIL HFA;VENTOLIN HFA) 108 (90 BASE) MCG/ACT inhaler Inhale 2 puffs into the lungs every 6 (six) hours as needed. For shortness of breath. 1 Inhaler 0   albuterol (PROVENTIL) (2.5 MG/3ML) 0.083% nebulizer solution USE 1 VIAL IN NEBULIZER 4 TIMES DAILY (Patient taking differently: Take 2.5 mg by nebulization 4 (four) times daily.) 360 mL 1   alum & mag hydroxide-simeth (MAALOX/MYLANTA) 200-200-20 MG/5ML suspension Take 30 mLs as needed by mouth for indigestion or heartburn.     aspirin EC 81 MG tablet Take 1 tablet (81 mg total) by mouth daily.     baclofen (LIORESAL) 20 MG tablet Take 2 tablets (40 mg total) by mouth 2 (two) times daily. 360 tablet 2   Bempedoic Acid-Ezetimibe (NEXLIZET) 180-10 MG TABS Take 1 tablet by mouth daily. 90 tablet 3   Blood Glucose Monitoring Suppl (ONE TOUCH ULTRA MINI) w/Device KIT 3 (three) times daily. for testing  0   calcium carbonate (OS-CAL) 1250 (500 Ca) MG chewable tablet Chew 1 tablet by mouth as needed for heartburn.     calcium carbonate (TUMS - DOSED IN MG ELEMENTAL CALCIUM) 500 MG chewable tablet Chew 2 tablets daily as needed by mouth for indigestion or heartburn.     Cholecalciferol (VITAMIN D) 2000 UNITS tablet Take  2,000 Units by mouth daily.     clopidogrel (PLAVIX) 75 MG tablet Take 75 mg by mouth daily.     Continuous Blood Gluc Sensor (FREESTYLE LIBRE 14 DAY SENSOR) MISC See admin instructions.     Cyanocobalamin (VITAMIN B-12 IJ) Inject 1,000 mcg  as directed every 30 (thirty) days.     cyclobenzaprine (FLEXERIL) 5 MG tablet Take 5 mg by mouth as needed for muscle spasms.     diazepam (VALIUM) 5 MG tablet Take 1 tablet (5 mg total) by mouth daily. 30 tablet 5   diclofenac sodium (VOLTAREN) 1 % GEL Apply 2 g topically 4 (four) times daily as needed (muscle pain).     diltiazem (CARDIZEM CD) 300 MG 24 hr capsule TAKE 1 CAPSULE (300 MG TOTAL) BY MOUTH DAILY. MUST KEEP SCHEDULED APPT FOR FUTURE REFILLS 90 capsule 1   famotidine (PEPCID) 40 MG tablet Take 1 tablet (40 mg total) by mouth daily as needed. (Patient taking differently: Take 40 mg by mouth daily as needed for heartburn or indigestion.) 90 tablet 1   fexofenadine (ALLEGRA) 180 MG tablet TAKE 1 TABLET BY MOUTH EVERY DAY (Patient taking differently: Take 180 mg by mouth as needed for allergies.) 90 tablet 3   Fluticasone-Salmeterol (ADVAIR DISKUS) 250-50 MCG/DOSE AEPB Inhale 1 puff into the lungs 2 (two) times daily. (Patient taking differently: Inhale 1 puff into the lungs as needed (shortness of breath).) 60 each 5   furosemide (LASIX) 40 MG tablet Take 1 tablet (40 mg total) by mouth daily. 90 tablet 3   insulin lispro protamine-insulin lispro (HUMALOG 75/25) (75-25) 100 UNIT/ML SUSP Inject 15-30 Units into the skin See admin instructions. Inject 15 units in the morning, 5 units at lunch, and 30 units at dinner.     isosorbide mononitrate (IMDUR) 60 MG 24 hr tablet Take one tablet ( 60 mg ) twice daily 8 hours apart. (Patient taking differently: Take 60 mg by mouth 2 (two) times daily. Take 8 hours apart.) 180 tablet 3   lisinopril (ZESTRIL) 20 MG tablet Take 20 mg by mouth daily.     loperamide (IMODIUM) 2 MG capsule Take 2 mg by mouth as needed for  diarrhea or loose stools.      meclizine (ANTIVERT) 25 MG tablet TAKE ONE TABLET BY MOUTH TWICE DAILY AS NEEDED FOR DIZZINESS (Patient taking differently: Take 25 mg by mouth as needed for dizziness.) 30 tablet 0   metoprolol succinate (TOPROL-XL) 100 MG 24 hr tablet TAKE ONE TABLET BY MOUTH EVERY DAY (Patient taking differently: Take 100 mg by mouth daily.) 90 tablet 3   Multiple Vitamin (MULTIVITAMIN WITH MINERALS) TABS Take 1 tablet by mouth daily.     Nebulizers (COMPRESSOR/NEBULIZER) MISC Use with albuterol 1 each 0   nitroGLYCERIN (NITROSTAT) 0.4 MG SL tablet Place 0.4 mg under the tongue every 5 (five) minutes as needed for chest pain.     nortriptyline (PAMELOR) 50 MG capsule TAKE 2 CAPSULES (100 MG TOTAL) BY MOUTH AT BEDTIME. 180 capsule 1   ONE TOUCH ULTRA TEST test strip 1 each by Other route 2 (two) times daily.  3   ONETOUCH DELICA LANCETS 01S MISC 3 (three) times daily. for testing  0   Spacer/Aero-Holding Chambers (AEROCHAMBER MV) inhaler Use as instructed 1 each 0   spironolactone (ALDACTONE) 25 MG tablet Take 1 tablet (25 mg total) by mouth daily. 90 tablet 3   Study - ORION 4 - inclisiran 300 mg/1.51m or placebo SQ injection (PI-Stuckey) Inject 300 mg into the skin every 6 (six) months.     Tiotropium Bromide Monohydrate (SPIRIVA RESPIMAT) 2.5 MCG/ACT AERS Inhale 2 puffs into the lungs daily. 4 g 6   Current Facility-Administered Medications on File Prior to Visit  Medication Dose Route Frequency Provider Last Rate Last Admin  cyanocobalamin ((VITAMIN B-12)) injection 1,000 mcg  1,000 mcg Intramuscular Q30 days Binnie Rail, MD   1,000 mcg at 03/26/16 1658        ROS:  All others reviewed and negative.  Objective        PE:  BP 134/72 (BP Location: Right Arm, Patient Position: Sitting, Cuff Size: Large)   Pulse 82   Temp 98.9 F (37.2 C) (Oral)   Ht '5\' 2"'  (1.575 m)   Wt 194 lb (88 kg)   LMP  (LMP Unknown)   SpO2 97%   BMI 35.48 kg/m                  Constitutional: Pt appears in NAD               HENT: Head: NCAT.                Right Ear: External ear normal.                 Left Ear: External ear normal.                Eyes: . Pupils are equal, round, and reactive to light. Conjunctivae and EOM are normal               Nose: without d/c or deformity               Neck: Neck supple. Gross normal ROM               Cardiovascular: Normal rate and regular rhythm.                 Pulmonary/Chest: Effort normal and breath sounds without rales or wheezing.                Abd:  Soft, NT, ND, + BS, no organomegaly               Neurological: Pt is alert. At baseline orientation, motor grossly intact               Skin: Skin is warm. No rashes, no other new lesions, LE edema - bilat LE swelling to knee but seems to emanate from bilateral ankle effusions left > right               Psychiatric: Pt behavior is normal without agitation   Micro: none  Cardiac tracings I have personally interpreted today:  none  Pertinent Radiological findings (summarize): none   Lab Results  Component Value Date   WBC 6.2 11/11/2020   HGB 11.9 (L) 11/11/2020   HCT 37.0 11/11/2020   PLT 323.0 11/11/2020   GLUCOSE 127 (H) 11/11/2020   CHOL 108 10/21/2020   TRIG 125 10/21/2020   HDL 27 (L) 10/21/2020   LDLCALC 58 10/21/2020   ALT 10 11/11/2020   AST 17 11/11/2020   NA 143 11/11/2020   K 4.2 11/11/2020   CL 102 11/11/2020   CREATININE 0.84 11/11/2020   BUN 22 11/11/2020   CO2 34 (H) 11/11/2020   TSH 0.76 02/12/2019   INR WILL FOLLOW 03/07/2019   HGBA1C 8.1 (H) 12/19/2019   MICROALBUR 2.0 10/30/2019   Assessment/Plan:  [name] is a 80 y.o. Black or African American [2] female with  has a past medical history of Adenomatous colon polyp, Allergy, Anxiety, Asthma, Carotid artery disease ([place]), Chronic diastolic CHF, COPD, Coronary artery disease, Diabetes mellitus, Gastroesophageal reflux disease, Hiatal hernia, 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 elevation  MI (2009),  Hyperlipidemia, Hypertension, PONV (postoperative nausea and vomiting), Schatzki's ring, Shoulder injury, and Sleep apnea.  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, with severe pain and difficult access to care; will hold on imaging or lab today, but for pain control, depomedrol im 80, medrol pack,  to f/u any worsening symptoms or concerns  Type 1 diabetes mellitus ([facility]) Lab Results  Component Value Date   HGBA1C 8.1 (H) 12/19/2019   Mild to mod uncontrolled, pt to continue current medical treatment humalog 75.25 with attention to cbgs on steroid tx, cont f/u with endo   Labile hypertension BP Readings from Last 3 Encounters:  11/13/20 (!) 150/90  11/11/20 134/72  10/31/20 (!) 145/88   Mild uncontrolled, likely situaitonal pt to continue medical treatment cardizem cd, lisniopril, toprol xl  Followup: Return if symptoms worsen or fail to improve.  Cathlean Cower, MD 11/15/2020 10:20 PM [facility]

## 2020-11-11 NOTE — Patient Instructions (Signed)
You had the steroid shot today  (depomedrol)  Please take all new medication as prescribed - the Medrol Pak, and tramadol as needed for pain  Please continue all other medications as before, and refills have been done if requested.  Please have the pharmacy call with any other refills you may need.  Please keep your appointments with your specialists as you may have planned  Please go to the LAB at the blood drawing area for the tests to be done  You will be contacted by phone if any changes need to be made immediately.  Otherwise, you will receive a letter about your results with an explanation, but please check with MyChart first.  Please remember to sign up for MyChart if you have not done so, as this will be important to you in the future with finding out test results, communicating by private email, and scheduling acute appointments online when needed.

## 2020-11-12 ENCOUNTER — Telehealth: Payer: Self-pay | Admitting: Internal Medicine

## 2020-11-12 MED ORDER — HYDROCODONE-ACETAMINOPHEN 5-325 MG PO TABS: 1.0000 | ORAL_TABLET | Freq: Four times a day (QID) | ORAL | 0 refills | Status: DC | PRN

## 2020-11-12 NOTE — Telephone Encounter (Signed)
Ok to try low dose hydrocodone which is different from codeine, morphine, tramadol

## 2020-11-12 NOTE — Telephone Encounter (Signed)
   Patient calling to report she can not take Tramadol. She sates Tramadol caused her to have hallucinations, requesting alternative med.  Please call patient at [phone]

## 2020-11-13 ENCOUNTER — Encounter: Payer: Self-pay | Admitting: Physical Therapy

## 2020-11-13 ENCOUNTER — Ambulatory Visit: Payer: Medicare Other | Attending: Internal Medicine | Admitting: Physical Therapy

## 2020-11-13 ENCOUNTER — Other Ambulatory Visit: Payer: Self-pay

## 2020-11-13 VITALS — BP 150/90

## 2020-11-13 DIAGNOSIS — R2689 Other abnormalities of gait and mobility: Secondary | ICD-10-CM | POA: Insufficient documentation

## 2020-11-13 DIAGNOSIS — R2681 Unsteadiness on feet: Secondary | ICD-10-CM | POA: Diagnosis present

## 2020-11-13 DIAGNOSIS — R42 Dizziness and giddiness: Secondary | ICD-10-CM | POA: Insufficient documentation

## 2020-11-13 NOTE — Patient Instructions (Signed)
Access Code: [email or web address] URL: [email or web address] Date: 11/13/2020 Prepared by: Willow Ora  Exercises Standing Balance in Corner with Eyes Closed - 1 x daily - 5 x weekly - 1 sets - 3 reps - 30 hold

## 2020-11-13 NOTE — Telephone Encounter (Signed)
Left message today for patient.

## 2020-11-14 NOTE — Telephone Encounter (Signed)
Attempted to call place twice but was on hold for over 30 minutes both times.  If they call back she has been referred as [facility].  They may send paperwork for [place] to complete if they are needing info from Dr. Quay Burow.

## 2020-11-15 ENCOUNTER — Encounter: Payer: Self-pay | Admitting: Internal Medicine

## 2020-11-15 NOTE — Assessment & Plan Note (Signed)
BP Readings from Last 3 Encounters:  11/13/20 (!) 150/90  11/11/20 134/72  10/31/20 (!) 145/88   Mild uncontrolled, likely situaitonal pt to continue medical treatment cardizem cd, lisniopril, toprol xl

## 2020-11-15 NOTE — Assessment & Plan Note (Signed)
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

## 2020-11-15 NOTE — Assessment & Plan Note (Signed)
Lab Results  Component Value Date   HGBA1C 8.1 (H) 12/19/2019   Mild to mod uncontrolled, pt to continue current medical treatment humalog 75.25 with attention to cbgs on steroid tx, cont f/u with endo

## 2020-11-16 NOTE — Therapy (Signed)
[address] [place], [place], [address] Phone: [phone]   Fax:  [phone]  Physical Therapy Treatment  Patient Details  Name: [name] MRN: [id number] Date of Birth: 11-19-1940 Referring Provider (PT): Dr. Billey Gosling   Encounter Date: 11/13/2020    11/13/20 2207  PT Visits / Re-Eval  Visit Number 3  Number of Visits 9  Date for PT Re-Evaluation 12/12/20  Authorization  Authorization Type [employer]  Authorization Time Period 10-16-20 - 12-16-20  PT Time Calculation  PT Start Time 1319  PT Stop Time 1400  PT Time Calculation (min) 41 min  PT - End of Session  Equipment Utilized During Treatment Gait belt  Activity Tolerance Patient tolerated treatment well  Behavior During Therapy [facility] for tasks assessed/performed    Past Medical History:  Diagnosis Date   Adenomatous colon polyp    Allergy    Anxiety    Asthma    Carotid artery disease ([place])    carotid US 02/2017: bilat ICA 1-39%   Chronic diastolic CHF    Echo 09/4164: EF 65-70, Gr 2 DD, mild MS (mean 5), PASP 44   COPD    pt is unsure if has been officially diagnosed   Coronary artery disease    CABG '09- cathed 12/09, 9/10, 6/11, 3/14 and 12/13/16- medical Rx // cath 12/2016 - 2/3 grafts patent >> med Rx // Myoview 12/17: low risk   Diabetes mellitus    Gastroesophageal reflux disease    Hiatal hernia    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   Hyperlipidemia    Hypertension    PONV (postoperative nausea and vomiting)    Schatzki's ring    Shoulder injury    resolved after shoulder surgery   Sleep apnea    not on cpap    Past Surgical History:  Procedure Laterality Date   ABDOMINAL HYSTERECTOMY     APPENDECTOMY     came out with Hysterectomy   CARDIAC CATHETERIZATION  07/23/2009   EF 60%   CARDIAC CATHETERIZATION  10/11/2008   CARDIAC CATHETERIZATION  03/01/2007   EF 75-80%   CARDIOVASCULAR STRESS TEST  11/15/2007    EF 60%   COLONOSCOPY     CORONARY ARTERY BYPASS GRAFT     SEVERELY DISEASED SAPHENOUS VEIN GRAFT TO THE RIGHT CORONARY ARTERY BUT WITH FAIRLY WELL PRESERVED FLOW TO THE DISTAL RIGHT CORONARY ARTERY FROM THE NATIVE CIRCULATION-RESTART  CATH IN JUNE 2000, REVEALS MILD/MODERATE  CAD WITH GOOD FLOW DOWN HER LAD   ESOPHAGOGASTRODUODENOSCOPY     EYE SURGERY     bilateral cataract surgery with lens implant   LEFT HEART CATH AND CORS/GRAFTS ANGIOGRAPHY N/A 12/14/2016   Procedure: LEFT HEART CATH AND CORS/GRAFTS ANGIOGRAPHY;  Surgeon: Jettie Booze, MD;  Location: [facility];  Service: Cardiovascular;  Laterality: N/A;   lense removal Left    POLYPECTOMY     ROTATOR CUFF REPAIR     right and left   TONSILLECTOMY     age 36   TOTAL KNEE ARTHROPLASTY Right 02/20/2014   Procedure: RIGHT TOTAL KNEE ARTHROPLASTY;  Surgeon: Tobi Bastos, MD;  Location: [facility];  Service: Orthopedics;  Laterality: Right;   tumor removed kidney     UPPER GASTROINTESTINAL ENDOSCOPY     US ECHOCARDIOGRAPHY  03/08/2008   EF 55-60%    Vitals:   11/13/20 1324  BP: (!) 150/90     11/13/20 1324  Symptoms/Limitations  Subjective No change in foot swelling. Plans to call again today as the meds that Dr. Jenny Reichmann has not helped. Has referral for Neuro-Opt[facility], has not heard from office to set up appt as yet. No fall. Pain continues in bil LE's.  Pertinent History h/o vertigo; DM type I, h/o MI, chronic CHF, OA, CAD, neurological gait dysfunction, OSA, B12 deficiency, chronic HA's, anxiety  Limitations Walking;House hold activities  Patient Stated Goals resolve the vertigo - get rid of the headache  Pain Assessment  Currently in Pain? Yes  Pain Score 7  Pain Location Leg  Pain Orientation Right;Left  Pain Descriptors / Indicators Aching;Sore  Pain Type Chronic pain  Pain Onset More than a month ago  Pain Frequency Intermittent  Aggravating Factors  unknown  Pain Relieving Factors rest, elevation of  LE"s.      11/13/20 1336  Functional Gait  Assessment  Gait assessed  Yes  Gait Level Surface 1 (9.75 sec's)  Change in Gait Speed 1 (minor change noted)  Gait with Horizontal Head Turns 2  Gait with Vertical Head Turns 1  Gait and Pivot Turn 1  Step Over Obstacle 1  Gait with Narrow Base of Support 0  Gait with Eyes Closed 0 (12.03 sec's with min assist at times)  Ambulating Backwards 0 (22.16 sec's wtih up to min assist)  Steps 2  Total Score 9  FGA comment: 9/30     11/13/20 1336  Transfers  Transfers Sit to Stand;Stand to Sit  Sit to Stand 5: Supervision;With upper extremity assist;Without upper extremity assist;From bed;From chair/3-in-1  Stand to Sit 5: Supervision;With upper extremity assist;To bed;To chair/3-in-1  Ambulation/Gait  Ambulation/Gait Yes  Ambulation Distance (Feet)  (around clinic with session)  Assistive device None  Gait Pattern [facility]  Ambulation Surface Level;Indoor  Self-Care  Self-Care Other Self-Care Comments  Other Self-Care Comments  does report not real change in double vision and dizziness. continues with LE edema and pain. Plans to follow up with her primary MD on this.      Issued to HEP this session:  Access Code: [email or web address] URL: [email or web address] Date: 11/13/2020 Prepared by: Willow Ora  Exercises Standing Balance in Corner with Eyes Closed - 1 x daily - 5 x weekly - 1 sets - 3 reps - 30 hold     11/16/20 2210  PT Education  Education Details results of FGA; started HEP  Person(s) Educated Patient  Methods Explanation;Demonstration;Verbal cues;Handout  Comprehension Verbalized understanding;Returned demonstration;Verbal cues required;Need further instruction        PT Education - 11/16/20 2210     Education Details results of FGA; started HEP    Person(s) Educated Patient    Methods Explanation;Demonstration;Verbal cues;Handout    Comprehension Verbalized understanding;Returned  demonstration;Verbal cues required;Need further instruction              PT Short Term Goals - 11/13/20 2216       PT SHORT TERM GOAL #1   Title Pt will subjectively report at least 25% improvement in dizziness. (all STGs due 11/14/20)    Baseline 11/13/20: no change    Time --    Period --    Status Not Met    Target Date 11/14/20      PT SHORT TERM GOAL #2   Title Improve FGA score by at least 3 points from baseline for increased safety with amb.    Baseline 11/13/20; baseline set this session with score of 9/30    Status Deferred  Target Date 11/14/20      PT SHORT TERM GOAL #3   Title Independent in HEP for vestibular and balance exercises.    Baseline 11/13/20: HEP initiated this session    Status Not Met               PT Long Term Goals - 10/17/20 1138       PT LONG TERM GOAL #1   Title Pt will increase FOTO score from 45/100 DPS to >/= 54/100 to demo improvement in dizziness.    Time 8    Period Weeks    Status New    Target Date 12/12/20      PT LONG TERM GOAL #2   Title Pt will improve FGA by 5 points to reduce fall risk with ambulation and to demo improved balance.    Time 8    Period Weeks    Status New    Target Date 12/12/20      PT LONG TERM GOAL #3   Title Pt will perform bed mobility including sit to both Rt and Lt sidelying, rolling and return to sitting with c/o dizziness </= 2/10 for all transitional movements.    Time 8    Period Weeks    Status New    Target Date 12/12/20      PT LONG TERM GOAL #4   Title Independent in updated HEP for balance and vestibular exercises.    Time 8    Period Weeks    Status New    Target Date 12/12/20               11/13/20 2207  Plan  Clinical Impression Statement Today's skilled session focused on establishing baseline score for FGA with pt scoring 9/30 today. Goal baselines to be updated. Remainder of session focused on starting an HEP for home with corner balance ex started. Will plan to  add more at next session. Pt's BP WFL, however diastolic remains on high side. Pt needing rest breaks due to bil LE pain throughout session. STGs checked with most goals not met as pt has missed multiple visits due to medical issues and baselines just set for two of the goals. The pt is making progress and should benefit from continued PT to progress toward unmet goals.  Personal Factors and Comorbidities Age;Comorbidity 2;Time since onset of injury/illness/exacerbation;Past/Current Experience  Comorbidities h/o vertigo, diplopia, DM type I, h/o MI, chronic CHF, OA, CAD, neurological gait dysfunction, OSA, B12 deficiency, chronic HA's, anxiety  Examination-Activity Limitations Bend;Bed Mobility;Locomotion Level;Stairs;Squat  Examination-Participation Restrictions Cleaning;Community Activity;Laundry;Shop;Meal Prep  Pt will benefit from skilled therapeutic intervention in order to improve on the following deficits Dizziness;Difficulty walking;Decreased balance;Impaired sensation  Stability/Clinical Decision Making Evolving/Moderate complexity  Rehab Potential Good  PT Frequency 1x / week  PT Duration 8 weeks  PT Treatment/Interventions ADLs/Self Care Home Management;Neuromuscular re-education;Gait training;Stair training;Therapeutic activities;Therapeutic exercise;Balance training;Vestibular  PT Next Visit Plan How are her feet, still swollen? Has [facility] called for appt?  Not able to do VOR due to diplopia. HEP: add more ex's for  balance and habituation  PT Home Exercise Plan Access Code: [email or web address]  Consulted and Agree with Plan of Care Patient         Patient will benefit from skilled therapeutic intervention in order to improve the following deficits and impairments:  Dizziness, Difficulty walking, Decreased balance, Impaired sensation  Visit Diagnosis: Dizziness and giddiness  Other abnormalities of gait and mobility  Unsteadiness on feet  Problem List Patient Active  Problem List   Diagnosis Date Noted   Bilateral ankle pain 11/11/2020   Dysuria 09/09/2020   Anxiety 09/09/2020   Coronary artery disease    Pelvic pain 10/30/2019   Chest pain at rest 08/24/2018   Arthralgia 08/01/2017   COPD with asthma ([place]) 05/04/2017   Angina pectoris ([place]) 01/05/2017   Insomnia 11/23/2016   Chronic nonintractable headache 11/23/2016   Pancreatic insufficiency 10/28/2016   Carotid artery disease ([place]) 05/19/2016   Fatigue 05/19/2016   Anemia 03/27/2016   Type 1 diabetes mellitus ([place]) 07/21/2015   B12 deficiency-monthly B12 injections 03/31/2015   OSA (obstructive sleep apnea) with hypoxia 11/20/2014   Fecal incontinence 08/28/2014   Neurologic gait dysfunction 08/28/2014   Falls 08/28/2014   Neck pain of over 3 months duration 08/28/2014   H/O total knee replacement 02/20/2014   Obesity (BMI 30-39.9) 11/12/2013   Chronic diastolic CHF (congestive heart failure) ([place]) 11/17/2012   Meniscus, lateral, anterior horn derangement 11/10/2011   Medial meniscus, posterior horn derangement 11/10/2011   Osteoarthritis of right knee 11/10/2011   Vertigo 10/05/2011   Labile hypertension 10/05/2011   CAD (coronary artery disease) 08/27/2010   MUSCLE CRAMPS 12/29/2009   History of myocardial infarction 11/19/2009   Extrinsic asthma 11/19/2009   GERD 11/19/2009   Cough 11/19/2009   Hyperlipidemia 11/18/2009    Willow Ora, PTA, [facility] [address] [place], [place] [address] [phone] 11/16/20, 10:23 PM   Name: ANSHU WEHNER MRN: [id number] Date of Birth: 1940-10-23

## 2020-11-19 NOTE — Progress Notes (Deleted)
NEUROLOGY FOLLOW UP OFFICE NOTE  [name] [id number]  Assessment/Plan:   ***  Subjective:  [name] is a 80 year old woman with coronary artery disease, hypertension, type 2 diabetes mellitus, hyperlipidemia, B12 deficiency and obstructive sleep apnea whom I previously saw for migraines presents today for dizziness.  History supplemented by PCP's note.  She has longstanding history of intermittent vertigo.  Usually ***.  She had a recurrence ***.  Has tried meclizine, diazepam and vestibular rehab.    MRI of brain without contrast on 08/24/2020 personally reviewed showed empty sella but overall unremarkable.  Current NSAIDS:  ASA 60m Current analgesics:  Tylenol Current triptans:  no Current ergotamine:  no Current anti-emetic:  Promethazine 239mCurrent muscle relaxants:  Baclofen 2042mwice daily Current anti-anxiolytic:  Klonopin Current sleep aide:  trazodone Current Antihypertensive medications:  Toprol XL 33m56mardizem, Lasix, Imdur Current Antidepressant medications:  Nortriptyline 75 mg Current Anticonvulsant medications:  no Current anti-CGRP:  no Current Vitamins/Herbal/Supplements:  B12 Current Antihistamines/Decongestants:  Allegra, Singulair Other therapy:  ice pack  HISTORY: Migraines: Onset:  She has history of migraines as a young woman which resolved after having children.  Current headaches started in 2016. Location:  She has constant holocephalic headache.  She has severe headache radiating from the base of her neck on the right up Quality:  pounding Initial Intensity:  10/10 Aura:  no Prodrome:  no Postdrome:  no Associated symptoms:  None.  No nausea, vomiting, photophobia, phonophobia or visual disturbance.  She has not had any new worse headache of her life, waking up from sleep Initial Duration:  All day Initial Frequency:  Daily headache, severe  Headache every other day Initial Frequency of abortive medication: daily Triggers:   None Relieving factors:  None Activity:  aggravates   Past NSAIDS:  Ibuprofen, naproxen (caused increased heart rate) Past analgesics:  Excedrin, tramadol and other opioids (adverse reaction) Past abortive triptans:  no Past muscle relaxants:  Robaxin, Flexeril Past anti-emetic:  Promethazine (effective) Past antihypertensive medications:  losartan Past antidepressant medications:  no Past anticonvulsant medications:  Gabapentin 200mg76mowsiness) Past vitamins/Herbal/Supplements:  CoQ10 Past antihistamines/decongestants:  no Other past therapy:  Physical therapy of neck.   Family history of headache:  father   MRI of brain without contrast from 09/16/14 was personally reviewed and revealed partially empty sella but otherwise unremarkable.   MRI of cervical spine from 09/16/14 was personally reviewed and demonstrated multilevel degenerative changes including left greater than right spurring at C3-C4 causing left foraminal narrowing, severe spurring at C4-C5 causing right moderate foraminal narrowing, and disc bulg and spurring at C6-C7 with moderate bilateral foraminal narrowing.  She was evaluated by Dr. RamosNelva Bushinterventional pain (epidural or facet joint injections).  He didn't think it was a good idea given that she would have to stop Plavix.  PAST MEDICAL HISTORY: Past Medical History:  Diagnosis Date   Adenomatous colon polyp    Allergy    Anxiety    Asthma    [place] bilat ICA 1-39%   Chronic diastolic CHF    Echo 02/2015/829565-70, Gr 2 DD, mild MS (mean 5), PASP 44   COPD    pt is unsure if has been officially diagnosed   Coronary artery disease    CABG '09- cathed 12/09, 9/10, 6/11, 3/14 and 12/13/16- medical Rx // cath 12/2016 - 2/3 grafts patent >> med Rx // Myoview 12/17: low risk   Diabetes  mellitus    Gastroesophageal reflux disease    Hiatal hernia    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   Hyperlipidemia     Hypertension    PONV (postoperative nausea and vomiting)    Schatzki's ring    Shoulder injury    resolved after shoulder surgery   Sleep apnea    not on cpap    MEDICATIONS: Current Outpatient Medications on File Prior to Visit  Medication Sig Dispense Refill   albuterol (PROVENTIL HFA;VENTOLIN HFA) 108 (90 BASE) MCG/ACT inhaler Inhale 2 puffs into the lungs every 6 (six) hours as needed. For shortness of breath. 1 Inhaler 0   albuterol (PROVENTIL) (2.5 MG/3ML) 0.083% nebulizer solution USE 1 VIAL IN NEBULIZER 4 TIMES DAILY (Patient taking differently: Take 2.5 mg by nebulization 4 (four) times daily.) 360 mL 1   alum & mag hydroxide-simeth (MAALOX/MYLANTA) 200-200-20 MG/5ML suspension Take 30 mLs as needed by mouth for indigestion or heartburn.     aspirin EC 81 MG tablet Take 1 tablet (81 mg total) by mouth daily.     baclofen (LIORESAL) 20 MG tablet Take 2 tablets (40 mg total) by mouth 2 (two) times daily. 360 tablet 2   Bempedoic Acid-Ezetimibe (NEXLIZET) 180-10 MG TABS Take 1 tablet by mouth daily. 90 tablet 3   Blood Glucose Monitoring Suppl (ONE TOUCH ULTRA MINI) w/Device KIT 3 (three) times daily. for testing  0   calcium carbonate (OS-CAL) 1250 (500 Ca) MG chewable tablet Chew 1 tablet by mouth as needed for heartburn.     calcium carbonate (TUMS - DOSED IN MG ELEMENTAL CALCIUM) 500 MG chewable tablet Chew 2 tablets daily as needed by mouth for indigestion or heartburn.     Cholecalciferol (VITAMIN D) 2000 UNITS tablet Take 2,000 Units by mouth daily.     clopidogrel (PLAVIX) 75 MG tablet Take 75 mg by mouth daily.     Continuous Blood Gluc Sensor (FREESTYLE LIBRE 14 DAY SENSOR) MISC See admin instructions.     Cyanocobalamin (VITAMIN B-12 IJ) Inject 1,000 mcg as directed every 30 (thirty) days.     cyclobenzaprine (FLEXERIL) 5 MG tablet Take 5 mg by mouth as needed for muscle spasms.     diazepam (VALIUM) 5 MG tablet Take 1 tablet (5 mg total) by mouth daily. 30 tablet 5    diclofenac sodium (VOLTAREN) 1 % GEL Apply 2 g topically 4 (four) times daily as needed (muscle pain).     diltiazem (CARDIZEM CD) 300 MG 24 hr capsule TAKE 1 CAPSULE (300 MG TOTAL) BY MOUTH DAILY. MUST KEEP SCHEDULED APPT FOR FUTURE REFILLS 90 capsule 1   famotidine (PEPCID) 40 MG tablet Take 1 tablet (40 mg total) by mouth daily as needed. (Patient taking differently: Take 40 mg by mouth daily as needed for heartburn or indigestion.) 90 tablet 1   fexofenadine (ALLEGRA) 180 MG tablet TAKE 1 TABLET BY MOUTH EVERY DAY (Patient taking differently: Take 180 mg by mouth as needed for allergies.) 90 tablet 3   Fluticasone-Salmeterol (ADVAIR DISKUS) 250-50 MCG/DOSE AEPB Inhale 1 puff into the lungs 2 (two) times daily. (Patient taking differently: Inhale 1 puff into the lungs as needed (shortness of breath).) 60 each 5   furosemide (LASIX) 40 MG tablet Take 1 tablet (40 mg total) by mouth daily. 90 tablet 3   HYDROcodone-acetaminophen (NORCO/VICODIN) 5-325 MG tablet Take 1 tablet by mouth every 6 (six) hours as needed. 20 tablet 0   insulin lispro protamine-insulin lispro (HUMALOG  75/25) (75-25) 100 UNIT/ML SUSP Inject 15-30 Units into the skin See admin instructions. Inject 15 units in the morning, 5 units at lunch, and 30 units at dinner.     isosorbide mononitrate (IMDUR) 60 MG 24 hr tablet Take one tablet ( 60 mg ) twice daily 8 hours apart. (Patient taking differently: Take 60 mg by mouth 2 (two) times daily. Take 8 hours apart.) 180 tablet 3   lisinopril (ZESTRIL) 20 MG tablet Take 20 mg by mouth daily.     loperamide (IMODIUM) 2 MG capsule Take 2 mg by mouth as needed for diarrhea or loose stools.      meclizine (ANTIVERT) 25 MG tablet TAKE ONE TABLET BY MOUTH TWICE DAILY AS NEEDED FOR DIZZINESS (Patient taking differently: Take 25 mg by mouth as needed for dizziness.) 30 tablet 0   methylPREDNISolone (MEDROL DOSEPAK) 4 MG TBPK tablet 4 tab by mouth x 3 days,2 tab x 3day,1 tab x 3 days 21 tablet 0    metoprolol succinate (TOPROL-XL) 100 MG 24 hr tablet TAKE ONE TABLET BY MOUTH EVERY DAY (Patient taking differently: Take 100 mg by mouth daily.) 90 tablet 3   Multiple Vitamin (MULTIVITAMIN WITH MINERALS) TABS Take 1 tablet by mouth daily.     Nebulizers (COMPRESSOR/NEBULIZER) MISC Use with albuterol 1 each 0   nitroGLYCERIN (NITROSTAT) 0.4 MG SL tablet Place 0.4 mg under the tongue every 5 (five) minutes as needed for chest pain.     nortriptyline (PAMELOR) 50 MG capsule TAKE 2 CAPSULES (100 MG TOTAL) BY MOUTH AT BEDTIME. 180 capsule 1   ONE TOUCH ULTRA TEST test strip 1 each by Other route 2 (two) times daily.  3   ONETOUCH DELICA LANCETS 52W MISC 3 (three) times daily. for testing  0   Spacer/Aero-Holding Chambers (AEROCHAMBER MV) inhaler Use as instructed 1 each 0   spironolactone (ALDACTONE) 25 MG tablet Take 1 tablet (25 mg total) by mouth daily. 90 tablet 3   Study - ORION 4 - inclisiran 300 mg/1.54m or placebo SQ injection (PI-Stuckey) Inject 300 mg into the skin every 6 (six) months.     Tiotropium Bromide Monohydrate (SPIRIVA RESPIMAT) 2.5 MCG/ACT AERS Inhale 2 puffs into the lungs daily. 4 g 6   traMADol (ULTRAM) 50 MG tablet Take 1 tablet (50 mg total) by mouth every 6 (six) hours as needed. 30 tablet 0   Current Facility-Administered Medications on File Prior to Visit  Medication Dose Route Frequency Provider Last Rate Last Admin   cyanocobalamin ((VITAMIN B-12)) injection 1,000 mcg  1,000 mcg Intramuscular Q30 days BBinnie Rail MD   1,000 mcg at 03/26/16 1658    ALLERGIES: Allergies  Allergen Reactions   Amlodipine Other (See Comments)    hallucinations    Banana Nausea And Vomiting    Stomach pumped   Co Q10 [Coenzyme Q10] Other (See Comments)    Body cramps   Codeine Other (See Comments)    Hallucinate, loose identity and don't know who I am   Insulin Aspart     Other reaction(s): Unknown   Lisinopril     Other reaction(s): nose bleed   Metformin And Related  Nausea And Vomiting   Morphine Other (See Comments)    Can not function, it immobilizes me    Pentazocine Nausea And Vomiting   Pravastatin Other (See Comments)    Hands locked up   Repatha [Evolocumab] Other (See Comments)    myalgias   Statins Other (See Comments)    Muscle  cramps   Sulfa Antibiotics Swelling   Sulfonamide Derivatives Swelling   Tramadol Hcl Other (See Comments)    FAMILY HISTORY: Family History  Problem Relation Age of Onset   Heart disease Maternal Grandfather    Heart failure Maternal Grandfather    Diabetes Maternal Grandfather    Heart attack Father    Diabetes Mother    Rheum arthritis Sister    Emphysema Paternal Uncle    Esophageal cancer Brother 48       she said he was born with it   Emphysema Paternal Aunt    Healthy Child    Neuropathy Neg Hx    Multiple sclerosis Neg Hx    Colon cancer Neg Hx    Colon polyps Neg Hx    Rectal cancer Neg Hx    Stomach cancer Neg Hx       Objective:  *** General: No acute distress.  Patient appears ***-groomed.   Head:  Normocephalic/atraumatic Eyes:  Fundi examined but not visualized Neck: supple, no paraspinal tenderness, full range of motion Heart:  Regular rate and rhythm Lungs:  Clear to auscultation bilaterally Back: No paraspinal tenderness Neurological Exam: alert and oriented to person, place, and time.  Speech fluent and not dysarthric, language intact.  CN II-XII intact. Bulk and tone normal, muscle strength 5/5 throughout.  Sensation to light touch intact.  Deep tendon reflexes 2+ throughout, toes downgoing.  Finger to nose testing intact.  Gait normal, Romberg negative.   Metta Clines, DO  CC: ***

## 2020-11-20 ENCOUNTER — Ambulatory Visit: Payer: Medicare Other | Admitting: Physical Therapy

## 2020-11-20 ENCOUNTER — Ambulatory Visit: Payer: Medicare Other | Admitting: Neurology

## 2020-11-20 ENCOUNTER — Encounter: Payer: Self-pay | Admitting: Physical Therapy

## 2020-11-20 ENCOUNTER — Other Ambulatory Visit: Payer: Self-pay

## 2020-11-20 DIAGNOSIS — R2681 Unsteadiness on feet: Secondary | ICD-10-CM

## 2020-11-20 DIAGNOSIS — R42 Dizziness and giddiness: Secondary | ICD-10-CM | POA: Diagnosis not present

## 2020-11-20 DIAGNOSIS — R2689 Other abnormalities of gait and mobility: Secondary | ICD-10-CM

## 2020-11-21 NOTE — Therapy (Signed)
[facility] [address] [place], [place], [address] Phone: [phone]   Fax:  [phone]  Physical Therapy Treatment  Patient Details  Name: [name] MRN: [id number] Date of Birth: [birth year] Referring Provider (PT): Dr. Billey Gosling   Encounter Date: 11/20/2020   PT End of Session - 11/21/20 1004     Visit Number 4    Number of Visits 9    Date for PT Re-Evaluation 12/12/20    Authorization Type UHC Medicare    Authorization Time Period 10-16-20 - 12-16-20    PT Start Time 1102    PT Stop Time 1740    PT Time Calculation (min) 43 min    Activity Tolerance Patient tolerated treatment well    Behavior During Therapy [facility] for tasks assessed/performed             Past Medical History:  Diagnosis Date   Adenomatous colon polyp    Allergy    Anxiety    Asthma    Carotid artery disease ([place])    carotid US 02/2017: bilat ICA 1-39%   Chronic diastolic CHF    Echo 09/1446: EF 65-70, Gr 2 DD, mild MS (mean 5), PASP 44   COPD    pt is unsure if has been officially diagnosed   Coronary artery disease    CABG '09- cathed 12/09, 9/10, 6/11, 3/14 and 12/13/16- medical Rx // cath 12/2016 - 2/3 grafts patent >> med Rx // Myoview 12/17: low risk   Diabetes mellitus    Gastroesophageal reflux disease    Hiatal hernia    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   Hyperlipidemia    Hypertension    PONV (postoperative nausea and vomiting)    Schatzki's ring    Shoulder injury    resolved after shoulder surgery   Sleep apnea    not on cpap    Past Surgical History:  Procedure Laterality Date   ABDOMINAL HYSTERECTOMY     APPENDECTOMY     came out with Hysterectomy   CARDIAC CATHETERIZATION  07/23/2009   EF 60%   CARDIAC CATHETERIZATION  10/11/2008   CARDIAC CATHETERIZATION  03/01/2007   EF 75-80%   CARDIOVASCULAR STRESS TEST  11/15/2007   EF 60%   COLONOSCOPY     CORONARY ARTERY BYPASS GRAFT     SEVERELY  DISEASED SAPHENOUS VEIN GRAFT TO THE RIGHT CORONARY ARTERY BUT WITH FAIRLY WELL PRESERVED FLOW TO THE DISTAL RIGHT CORONARY ARTERY FROM THE NATIVE CIRCULATION-RESTART  CATH IN JUNE 2000, REVEALS MILD/MODERATE  CAD WITH GOOD FLOW DOWN HER LAD   ESOPHAGOGASTRODUODENOSCOPY     EYE SURGERY     bilateral cataract surgery with lens implant   LEFT HEART CATH AND CORS/GRAFTS ANGIOGRAPHY N/A 12/14/2016   Procedure: LEFT HEART CATH AND CORS/GRAFTS ANGIOGRAPHY;  Surgeon: Jettie Booze, MD;  Location: [facility];  Service: Cardiovascular;  Laterality: N/A;   lense removal Left    POLYPECTOMY     ROTATOR CUFF REPAIR     right and left   TONSILLECTOMY     age 80   TOTAL KNEE ARTHROPLASTY Right 02/20/2014   Procedure: RIGHT TOTAL KNEE ARTHROPLASTY;  Surgeon: Tobi Bastos, MD;  Location: [facility];  Service: Orthopedics;  Laterality: Right;   tumor removed kidney     UPPER GASTROINTESTINAL ENDOSCOPY     US ECHOCARDIOGRAPHY  03/08/2008   EF 55-60%    There were no vitals filed  for this visit.   Subjective Assessment - 11/20/20 1108     Subjective Pt reports she had episode of dizziness yesterday - was doing things in the kitchen and knew she was getting tired; started getting dizzy and sat down - took about 15" for dizziness to completely subside; has opthalmology appt on Monday, 10-17; swelling in her ankles and feet is not as much as it was; pt states she has appt with Dr. Tomi Likens today after PT    Pertinent History h/o vertigo; DM type I, h/o MI, chronic CHF, OA, CAD, neurological gait dysfunction, OSA, B12 deficiency, chronic HA's, anxiety    Limitations Walking;House hold activities    Patient Stated Goals resolve the vertigo - get rid of the headache    Currently in Pain? Yes    Pain Score 5     Pain Location Foot    Pain Orientation Right;Left    Pain Descriptors / Indicators Sharp;Pins and needles    Pain Type Neuropathic pain    Pain Onset More than a month ago                                [facility] Adult PT Treatment/Exercise - 11/21/20 0001       Ambulation/Gait   Ambulation/Gait Yes    Ambulation/Gait Assistance 6: Modified independent (Device/Increase time)    Ambulation Distance (Feet) 115 Feet    Assistive device None    Gait Pattern Within Functional Limits    Ambulation Surface Level;Indoor    Gait Comments pt wearing slide on sandals due to edema in bil. feet - gait speed is impacted by these shoes due to ensuring they stay on feet                 Balance Exercises - 11/21/20 0001       Balance Exercises: Standing   Standing Eyes Opened Wide (BOA);Head turns;Foam/compliant surface;5 reps;Solid surface   horizontal and vertical head turns standing inside // bars - on Airex - with UE support, with gradual decrease in support on // bars   Standing Eyes Closed Wide (BOA);Head turns;Foam/compliant surface;5 reps   standing on Airex inside // bars with UE support, with gradual decrease in support   Rockerboard Anterior/posterior;Head turns;EO;EC;Other reps (comment);UE support   20 reps total - 10 reps with EO and 10 reps with EC; UE support on // bars   Sidestepping 4 reps   inside // bars with minimal UE support on // bars   Turning Right;Left   2 reps to each side   Marching Foam/compliant surface;Solid surface;Upper extremity assist 2;Intermittent upper extremity assist;Head turns;Static   marching on floor initially without head turns with bil. UE support on // bars; progressed to marching with horizontal head turns 5 reps, vertical head turns 5 reps; progressed to marching on Airex with EO without head turns   Other Standing Exercises Pt performed stepping down to floor from Airex with UE support on // bars 5 reps each foot for improved SLS on each leg on compliant surface            Pt performed gaze stabilization ex. - seated on mat - performed visual tracking of ball 2 reps clockwise and 2 reps counterclockwise - no  c/o increased dizziness or diplopia with this exercise (ball was moving,  not head - eyes only for tracking)       PT Short Term Goals - 11/21/20  [place] #1   Title Pt will subjectively report at least 25% improvement in dizziness. (all STGs due 11/14/20)    Baseline 11/13/20: no change    Status Not Met    Target Date 11/14/20      PT SHORT TERM GOAL #2   Title Improve FGA score by at least 3 points from baseline for increased safety with amb.    Baseline 11/13/20; baseline set this session with score of 9/30    Status Deferred    Target Date 11/14/20      PT SHORT TERM GOAL #3   Title Independent in HEP for vestibular and balance exercises.    Baseline 11/13/20: HEP initiated this session    Status Not Met               PT Long Term Goals - 11/21/20 1006       PT LONG TERM GOAL #1   Title Pt will increase FOTO score from 45/100 DPS to >/= 54/100 to demo improvement in dizziness.    Time 8    Period Weeks    Status New      PT LONG TERM GOAL #2   Title Pt will improve FGA by 5 points to reduce fall risk with ambulation and to demo improved balance.    Time 8    Period Weeks    Status New      PT LONG TERM GOAL #3   Title Pt will perform bed mobility including sit to both Rt and Lt sidelying, rolling and return to sitting with c/o dizziness </= 2/10 for all transitional movements.    Time 8    Period Weeks    Status New      PT LONG TERM GOAL #4   Title Independent in updated HEP for balance and vestibular exercises.    Time 8    Period Weeks    Status New                   Plan - 11/21/20 1006     Clinical Impression Statement Today's PT session focused on static and dynamic standing balance exercises on solid and compliant surfaces for increased vestibular input with balance.  Pt continues to c/o double vision when she turns her head to Rt side, but reported less diplopia with smaller ROM with Rt rotation.  Pt has  opthalmologist appt on Monday, 10-17.  Pt did well with balance activities performed with gradual decrease in UE support on // bars, stating she was very pleased with performance with sidestepping without UE support. Pt did report increased dizziness provoked with standing on Airex with EC with EC - both horizontal & vertical head turns. Cont with POC.    Personal Factors and Comorbidities Age;Comorbidity 2;Time since onset of injury/illness/exacerbation;Past/Current Experience    Comorbidities h/o vertigo, diplopia, DM type I, h/o MI, chronic CHF, OA, CAD, neurological gait dysfunction, OSA, B12 deficiency, chronic HA's, anxiety    Examination-Activity Limitations Bend;Bed Mobility;Locomotion Level;Stairs;Squat    Examination-Participation Restrictions Cleaning;Community Activity;Laundry;Shop;Meal Prep    Stability/Clinical Decision Making Evolving/Moderate complexity    Rehab Potential Good    PT Frequency 1x / week    PT Duration 8 weeks    PT Treatment/Interventions ADLs/Self Care Home Management;Neuromuscular re-education;Gait training;Stair training;Therapeutic activities;Therapeutic exercise;Balance training;Vestibular    PT Next Visit Plan Cont balance/vestibular exercises - gait speed impacted by pt wearing slides on 11-20-20 due to edema  in feet/ankles    PT Home Exercise Plan Access Code: [place] and Agree with Plan of Care Patient             Patient will benefit from skilled therapeutic intervention in order to improve the following deficits and impairments:  Dizziness, Difficulty walking, Decreased balance, Impaired sensation  Visit Diagnosis: Dizziness and giddiness  Other abnormalities of gait and mobility  Unsteadiness on feet     Problem List Patient Active Problem List   Diagnosis Date Noted   Bilateral ankle pain 11/11/2020   Dysuria 09/09/2020   Anxiety 09/09/2020   Coronary artery disease    Pelvic pain 10/30/2019   Chest pain at rest  08/24/2018   Arthralgia 08/01/2017   COPD with asthma ([place]) 05/04/2017   Angina pectoris ([place]) 01/05/2017   Insomnia 11/23/2016   Chronic nonintractable headache 11/23/2016   Pancreatic insufficiency 10/28/2016   Carotid artery disease ([place]) 05/19/2016   Fatigue 05/19/2016   Anemia 03/27/2016   Type 1 diabetes mellitus ([place]) 07/21/2015   B12 deficiency-monthly B12 injections 03/31/2015   OSA (obstructive sleep apnea) with hypoxia 11/20/2014   Fecal incontinence 08/28/2014   Neurologic gait dysfunction 08/28/2014   Falls 08/28/2014   Neck pain of over 3 months duration 08/28/2014   H/O total knee replacement 02/20/2014   Obesity (BMI 30-39.9) 11/12/2013   Chronic diastolic CHF (congestive heart failure) ([place]) 11/17/2012   Meniscus, lateral, anterior horn derangement 11/10/2011   Medial meniscus, posterior horn derangement 11/10/2011   Osteoarthritis of right knee 11/10/2011   Vertigo 10/05/2011   Labile hypertension 10/05/2011   CAD (coronary artery disease) 08/27/2010   MUSCLE CRAMPS 12/29/2009   History of myocardial infarction 11/19/2009   Extrinsic asthma 11/19/2009   GERD 11/19/2009   Cough 11/19/2009   Hyperlipidemia 11/18/2009    Alda Lea, PT 11/21/2020, 10:17 AM  [facility] [address] [place], [place], [address] Phone: [phone]   Fax:  [phone]  Name: [name] MRN: [id number] Date of Birth: 05/12/1940

## 2020-11-27 ENCOUNTER — Ambulatory Visit: Payer: Medicare Other | Admitting: Physical Therapy

## 2020-12-01 IMAGING — NM NM BONE WHOLE BODY
4 series · 4 of 4 positions shown · non-contrast
Comparison: None.

CLINICAL DATA: Lumbosacral spine, pelvic fracture

EXAM:
NUCLEAR MEDICINE WHOLE BODY BONE SCAN
TECHNIQUE: Whole body anterior and posterior images were obtained approximately
3 hours after intravenous injection of radiopharmaceutical.
RADIOPHARMACEUTICALS:  20.3 mCi 3echnetium-VVm MDP IV

[Series 1: whole body · 2.66mm/px · 1 of 1 slices shown (1 of 2)]
[im 1/1]
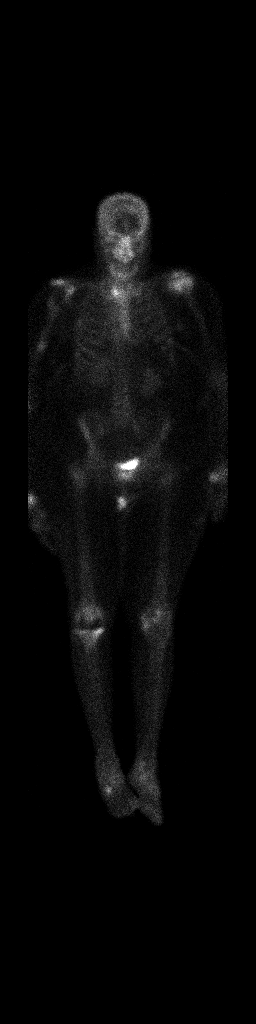

[Series 1: wbr_bone_40 whole body · 2.66mm/px · 1 of 1 slices shown (1 of 2)]
[im 1/1]
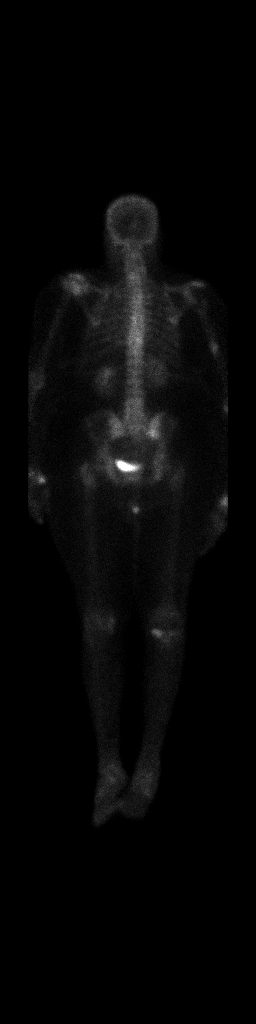

[Series 1: wbr_bone_40 whole body · 2.66mm/px · 1 of 1 slices shown (2 of 2)]
[im 1/1]
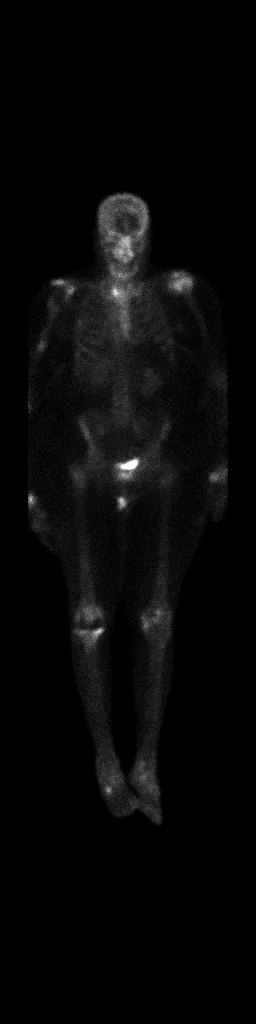

[Series 1: whole body · 2.66mm/px · 1 of 1 slices shown (2 of 2)]
[im 1/1]
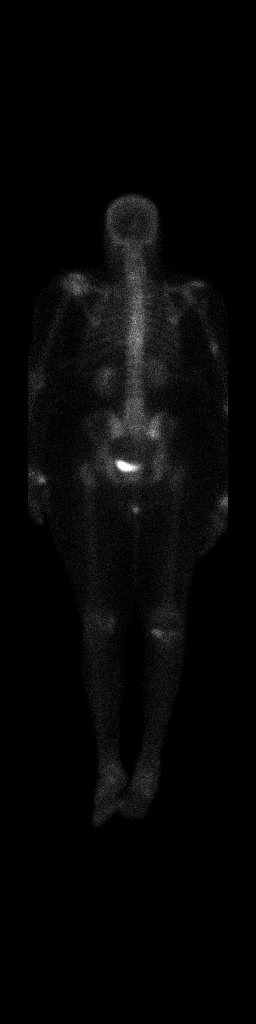

[4 of 4 positions shown; findings below may reference images not displayed]

FINDINGS: Defect within the right humeral head is in keeping with history of
right total shoulder arthroplasty. Focal uptake within the mid
diaphysis of the humerus likely represents stress shielding at the
inferior margin of the a humeral component. Uptake related to
degenerative changes are noted within the shoulders bilaterally.
Similarly, focal uptake within the right sternoclavicular joint, the
base of the thumbs, the left knee and the right midfoot are all
likely degenerative in nature. Surgical changes of right total knee
arthroplasty are identified with reactive uptake within the adjacent
osseous structures.

Otherwise normal distribution of radiotracer within the visualized
axial and appendicular skeleton. Specifically, no evidence of focal
uptake to suggest acute lumbar or pelvic fracture.

Normal soft tissue distribution. Normal uptake within the kidneys
and bladder.
IMPRESSION: Reactive periprosthetic and multifocal degenerative changes. No
evidence of acute fracture involving the lumbar spine and pelvis.

## 2020-12-04 ENCOUNTER — Ambulatory Visit: Payer: Medicare Other | Admitting: Physical Therapy

## 2020-12-04 ENCOUNTER — Other Ambulatory Visit: Payer: Self-pay

## 2020-12-04 DIAGNOSIS — R42 Dizziness and giddiness: Secondary | ICD-10-CM

## 2020-12-04 DIAGNOSIS — R2681 Unsteadiness on feet: Secondary | ICD-10-CM

## 2020-12-04 DIAGNOSIS — R2689 Other abnormalities of gait and mobility: Secondary | ICD-10-CM

## 2020-12-05 ENCOUNTER — Encounter: Payer: Self-pay | Admitting: Physical Therapy

## 2020-12-05 NOTE — Therapy (Signed)
[facility] [address] [place], [place], [address] Phone: [phone]   Fax:  [phone]  Physical Therapy Treatment  Patient Details  Name: [name] MRN: [id number] Date of Birth: November 05, 1940 Referring Provider (PT): Dr. Billey Gosling   Encounter Date: 12/04/2020   PT End of Session - 12/05/20 1114     Visit Number 5    Number of Visits 9    Date for PT Re-Evaluation 12/12/20    Authorization Type UHC Medicare    Authorization Time Period 10-16-20 - 12-16-20    PT Start Time 1317    PT Stop Time 1400    PT Time Calculation (min) 43 min    Activity Tolerance Patient tolerated treatment well    Behavior During Therapy [facility] for tasks assessed/performed             Past Medical History:  Diagnosis Date   Adenomatous colon polyp    Allergy    Anxiety    Asthma    Carotid artery disease ([place])    carotid US 02/2017: bilat ICA 1-39%   Chronic diastolic CHF    Echo 08/6732: EF 65-70, Gr 2 DD, mild MS (mean 5), PASP 44   COPD    pt is unsure if has been officially diagnosed   Coronary artery disease    CABG '09- cathed 12/09, 9/10, 6/11, 3/14 and 12/13/16- medical Rx // cath 12/2016 - 2/3 grafts patent >> med Rx // Myoview 12/17: low risk   Diabetes mellitus    Gastroesophageal reflux disease    Hiatal hernia    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   Hyperlipidemia    Hypertension    PONV (postoperative nausea and vomiting)    Schatzki's ring    Shoulder injury    resolved after shoulder surgery   Sleep apnea    not on cpap    Past Surgical History:  Procedure Laterality Date   ABDOMINAL HYSTERECTOMY     APPENDECTOMY     came out with Hysterectomy   CARDIAC CATHETERIZATION  07/23/2009   EF 60%   CARDIAC CATHETERIZATION  10/11/2008   CARDIAC CATHETERIZATION  03/01/2007   EF 75-80%   CARDIOVASCULAR STRESS TEST  11/15/2007   EF 60%   COLONOSCOPY     CORONARY ARTERY BYPASS GRAFT     SEVERELY  DISEASED SAPHENOUS VEIN GRAFT TO THE RIGHT CORONARY ARTERY BUT WITH FAIRLY WELL PRESERVED FLOW TO THE DISTAL RIGHT CORONARY ARTERY FROM THE NATIVE CIRCULATION-RESTART  CATH IN JUNE 2000, REVEALS MILD/MODERATE  CAD WITH GOOD FLOW DOWN HER LAD   ESOPHAGOGASTRODUODENOSCOPY     EYE SURGERY     bilateral cataract surgery with lens implant   LEFT HEART CATH AND CORS/GRAFTS ANGIOGRAPHY N/A 12/14/2016   Procedure: LEFT HEART CATH AND CORS/GRAFTS ANGIOGRAPHY;  Surgeon: Jettie Booze, MD;  Location: [facility];  Service: Cardiovascular;  Laterality: N/A;   lense removal Left    POLYPECTOMY     ROTATOR CUFF REPAIR     right and left   TONSILLECTOMY     age 24   TOTAL KNEE ARTHROPLASTY Right 02/20/2014   Procedure: RIGHT TOTAL KNEE ARTHROPLASTY;  Surgeon: Tobi Bastos, MD;  Location: [facility];  Service: Orthopedics;  Laterality: Right;   tumor removed kidney     UPPER GASTROINTESTINAL ENDOSCOPY     US ECHOCARDIOGRAPHY  03/08/2008   EF 55-60%    There were no vitals filed  for this visit.   Subjective Assessment - 12/04/20 1321     Subjective Pt reports she thinks walked in her sleep last week and fell - that is why she cancelled last week; reports dizziness was really bad yesterday and day before; has not had neurologist appt nor opthalmologist appt - schedule mix up    Pertinent History h/o vertigo; DM type I, h/o MI, chronic CHF, OA, CAD, neurological gait dysfunction, OSA, B12 deficiency, chronic HA's, anxiety    Limitations Walking;House hold activities    Patient Stated Goals resolve the vertigo - get rid of the headache    Currently in Pain? No/denies    Pain Onset More than a month ago                               [facility] Adult PT Treatment/Exercise - 12/05/20 0001       Transfers   Transfers Sit to Stand;Stand to Sit    Sit to Stand 5: Supervision    Stand to Sit 5: Supervision    Number of Reps Other reps (comment)   5   Comments EC - feet on  floor - without UE support from mat with CGA      Ambulation/Gait   Ambulation/Gait Yes    Ambulation/Gait Assistance 6: Modified independent (Device/Increase time)    Ambulation Distance (Feet) 100 Feet    Assistive device None    Gait Pattern Within Functional Limits    Ambulation Surface Level;Indoor    Gait Comments pt continues to slide on sandals due to swelling in feet                 Balance Exercises - 12/05/20 0001       Balance Exercises: Standing   Standing Eyes Opened Wide (BOA);Head turns;Foam/compliant surface;5 reps   horizontal and vertical head turns   Standing Eyes Closed Wide (BOA);Head turns;Foam/compliant surface;5 reps   on Airex inside // bars - with UE support prn   Rockerboard Anterior/posterior;Head turns;EO;EC;Other reps (comment);UE support   20 reps total - 10 reps with EO and 10 reps with EC; UE support on // bars   Sidestepping 2 reps   inside // bars   Marching Foam/compliant surface;Solid surface;Upper extremity assist 2;Intermittent upper extremity assist;Head turns;Static   marching on floor initially without head turns with bil. UE support on // bars; progressed to marching with horizontal head turns 5 reps, vertical head turns 5 reps; progressed to marching on Airex with EO without head turns   Other Standing Exercises Pt performed stepping down to floor from Airex with UE support on // bars 5 reps each foot for improved SLS on each leg on compliant surface    Other Standing Exercises Comments Pt stood on Airex with minimal UE support - performed stepping down to floor and back up on top with minimal UE support - same leg without alternating 5 reps                  PT Short Term Goals - 12/05/20 1119       PT SHORT TERM GOAL #1   Title Pt will subjectively report at least 25% improvement in dizziness. (all STGs due 11/14/20)    Baseline 11/13/20: no change    Status Not Met    Target Date 11/14/20      PT SHORT TERM GOAL #2    Title Improve FGA score by at  least 3 points from baseline for increased safety with amb.    Baseline 11/13/20; baseline set this session with score of 9/30    Status Deferred    Target Date 11/14/20      PT SHORT TERM GOAL #3   Title Independent in HEP for vestibular and balance exercises.    Baseline 11/13/20: HEP initiated this session    Status Not Met               PT Long Term Goals - 12/05/20 1119       PT LONG TERM GOAL #1   Title Pt will increase FOTO score from 45/100 DPS to >/= 54/100 to demo improvement in dizziness.    Time 8    Period Weeks    Status New      PT LONG TERM GOAL #2   Title Pt will improve FGA by 5 points to reduce fall risk with ambulation and to demo improved balance.    Time 8    Period Weeks    Status New      PT LONG TERM GOAL #3   Title Pt will perform bed mobility including sit to both Rt and Lt sidelying, rolling and return to sitting with c/o dizziness </= 2/10 for all transitional movements.    Time 8    Period Weeks    Status New      PT LONG TERM GOAL #4   Title Independent in updated HEP for balance and vestibular exercises.    Time 8    Period Weeks    Status New                   Plan - 12/05/20 1116     Clinical Impression Statement Pt continues to report she has double vision with standing activities with head turns with EO; reports this subsides with short rest period (approx. 30 secs) and pt able to resume PT activities.  Pt reports she was unable to make neurologist and opthalmologist appts - had appt mix ups with times.  Cont with POC.    Personal Factors and Comorbidities Age;Comorbidity 2;Time since onset of injury/illness/exacerbation;Past/Current Experience    Comorbidities h/o vertigo, diplopia, DM type I, h/o MI, chronic CHF, OA, CAD, neurological gait dysfunction, OSA, B12 deficiency, chronic HA's, anxiety    Examination-Activity Limitations Bend;Bed Mobility;Locomotion Level;Stairs;Squat     Examination-Participation Restrictions Cleaning;Community Activity;Laundry;Shop;Meal Prep    Stability/Clinical Decision Making Evolving/Moderate complexity    Rehab Potential Good    PT Frequency 1x / week    PT Duration 8 weeks    PT Treatment/Interventions ADLs/Self Care Home Management;Neuromuscular re-education;Gait training;Stair training;Therapeutic activities;Therapeutic exercise;Balance training;Vestibular    PT Next Visit Plan Cont balance/vestibular exercises - gait speed impacted by pt wearing slides on 11-20-20 due to edema in feet/ankles    PT Home Exercise Plan Access Code: [place] and Agree with Plan of Care Patient             Patient will benefit from skilled therapeutic intervention in order to improve the following deficits and impairments:  Dizziness, Difficulty walking, Decreased balance, Impaired sensation  Visit Diagnosis: Dizziness and giddiness  Other abnormalities of gait and mobility  Unsteadiness on feet     Problem List Patient Active Problem List   Diagnosis Date Noted   Bilateral ankle pain 11/11/2020   Dysuria 09/09/2020   Anxiety 09/09/2020   Coronary artery disease    Pelvic pain 10/30/2019  Chest pain at rest 08/24/2018   Arthralgia 08/01/2017   COPD with asthma ([place]) 05/04/2017   Angina pectoris ([place]) 01/05/2017   Insomnia 11/23/2016   Chronic nonintractable headache 11/23/2016   Pancreatic insufficiency 10/28/2016   Carotid artery disease ([place]) 05/19/2016   Fatigue 05/19/2016   Anemia 03/27/2016   Type 1 diabetes mellitus ([place]) 07/21/2015   B12 deficiency-monthly B12 injections 03/31/2015   OSA (obstructive sleep apnea) with hypoxia 11/20/2014   Fecal incontinence 08/28/2014   Neurologic gait dysfunction 08/28/2014   Falls 08/28/2014   Neck pain of over 3 months duration 08/28/2014   H/O total knee replacement 02/20/2014   Obesity (BMI 30-39.9) 11/12/2013   Chronic diastolic CHF (congestive heart failure)  ([place]) 11/17/2012   Meniscus, lateral, anterior horn derangement 11/10/2011   Medial meniscus, posterior horn derangement 11/10/2011   Osteoarthritis of right knee 11/10/2011   Vertigo 10/05/2011   Labile hypertension 10/05/2011   CAD (coronary artery disease) 08/27/2010   MUSCLE CRAMPS 12/29/2009   History of myocardial infarction 11/19/2009   Extrinsic asthma 11/19/2009   GERD 11/19/2009   Cough 11/19/2009   Hyperlipidemia 11/18/2009    Donnita Farina, [place], PT 12/05/2020, 11:20 AM  [facility] [address] [place], [place], [address] Phone: [phone]   Fax:  [phone]  Name: [name] MRN: [id number] Date of Birth: 07-Oct-1940

## 2020-12-11 ENCOUNTER — Ambulatory Visit: Payer: Medicare Other | Attending: Internal Medicine | Admitting: Physical Therapy

## 2020-12-11 ENCOUNTER — Other Ambulatory Visit: Payer: Self-pay

## 2020-12-11 ENCOUNTER — Encounter: Payer: Medicare Other | Admitting: Physical Therapy

## 2020-12-11 VITALS — BP 132/76 | HR 104

## 2020-12-11 DIAGNOSIS — R2689 Other abnormalities of gait and mobility: Secondary | ICD-10-CM | POA: Insufficient documentation

## 2020-12-11 DIAGNOSIS — R2681 Unsteadiness on feet: Secondary | ICD-10-CM | POA: Insufficient documentation

## 2020-12-11 DIAGNOSIS — R42 Dizziness and giddiness: Secondary | ICD-10-CM | POA: Insufficient documentation

## 2020-12-12 NOTE — Therapy (Signed)
[facility] [address] [place], [place], [address] Phone: [phone]   Fax:  [phone]  Physical Therapy Treatment  Patient Details  Name: [name] MRN: [id number] Date of Birth: 1940/11/29 Referring Provider (PT): Dr. Billey Gosling   Encounter Date: 12/11/2020   PT End of Session - 12/12/20 1150     Visit Number 6    Number of Visits 9    Date for PT Re-Evaluation 12/12/20    Authorization Type UHC Medicare    Authorization Time Period 10-16-20 - 12-16-20    PT Start Time 7628    PT Stop Time 1446    PT Time Calculation (min) 43 min    Activity Tolerance Patient tolerated treatment well    Behavior During Therapy [facility] for tasks assessed/performed             Past Medical History:  Diagnosis Date   Adenomatous colon polyp    Allergy    Anxiety    Asthma    Carotid artery disease ([place])    carotid US 02/2017: bilat ICA 1-39%   Chronic diastolic CHF    Echo 04/1515: EF 65-70, Gr 2 DD, mild MS (mean 5), PASP 44   COPD    pt is unsure if has been officially diagnosed   Coronary artery disease    CABG '09- cathed 12/09, 9/10, 6/11, 3/14 and 12/13/16- medical Rx // cath 12/2016 - 2/3 grafts patent >> med Rx // Myoview 12/17: low risk   Diabetes mellitus    Gastroesophageal reflux disease    Hiatal hernia    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   Hyperlipidemia    Hypertension    PONV (postoperative nausea and vomiting)    Schatzki's ring    Shoulder injury    resolved after shoulder surgery   Sleep apnea    not on cpap    Past Surgical History:  Procedure Laterality Date   ABDOMINAL HYSTERECTOMY     APPENDECTOMY     came out with Hysterectomy   CARDIAC CATHETERIZATION  07/23/2009   EF 60%   CARDIAC CATHETERIZATION  10/11/2008   CARDIAC CATHETERIZATION  03/01/2007   EF 75-80%   CARDIOVASCULAR STRESS TEST  11/15/2007   EF 60%   COLONOSCOPY     CORONARY ARTERY BYPASS GRAFT     SEVERELY  DISEASED SAPHENOUS VEIN GRAFT TO THE RIGHT CORONARY ARTERY BUT WITH FAIRLY WELL PRESERVED FLOW TO THE DISTAL RIGHT CORONARY ARTERY FROM THE NATIVE CIRCULATION-RESTART  CATH IN JUNE 2000, REVEALS MILD/MODERATE  CAD WITH GOOD FLOW DOWN HER LAD   ESOPHAGOGASTRODUODENOSCOPY     EYE SURGERY     bilateral cataract surgery with lens implant   LEFT HEART CATH AND CORS/GRAFTS ANGIOGRAPHY N/A 12/14/2016   Procedure: LEFT HEART CATH AND CORS/GRAFTS ANGIOGRAPHY;  Surgeon: Jettie Booze, MD;  Location: [facility];  Service: Cardiovascular;  Laterality: N/A;   lense removal Left    POLYPECTOMY     ROTATOR CUFF REPAIR     right and left   TONSILLECTOMY     age 15   TOTAL KNEE ARTHROPLASTY Right 02/20/2014   Procedure: RIGHT TOTAL KNEE ARTHROPLASTY;  Surgeon: Tobi Bastos, MD;  Location: [facility];  Service: Orthopedics;  Laterality: Right;   tumor removed kidney     UPPER GASTROINTESTINAL ENDOSCOPY     US ECHOCARDIOGRAPHY  03/08/2008   EF 55-60%    Vitals:   12/11/20 1409  BP: 132/76  Pulse: (!) 104     Subjective Assessment - 12/11/20 1411     Subjective Pt reports she feels moderate light-headedness at this time - took BP medicine as usual; doesn't know what is going on with her to cause her to feel this way; states appt with Dr. Tomi Likens is now not scheduled until Feb. next year; states she has appt with her PCP next week    Pertinent History h/o vertigo; DM type I, h/o MI, chronic CHF, OA, CAD, neurological gait dysfunction, OSA, B12 deficiency, chronic HA's, anxiety    Limitations Walking;House hold activities    Patient Stated Goals resolve the vertigo - get rid of the headache    Currently in Pain? No/denies    Pain Onset More than a month ago                               [facility] Adult PT Treatment/Exercise - 12/12/20 0001       Transfers   Transfers Sit to Stand;Stand to Sit    Sit to Stand 5: Supervision    Stand to Sit 5: Supervision    Number of  Reps Other reps (comment)   3   Comments EO - feet on floor                 Balance Exercises - 12/12/20 0001       Balance Exercises: Standing   Standing Eyes Opened Wide (BOA);Head turns;Foam/compliant surface;5 reps   horizontal and vertical head turns   Standing Eyes Closed Wide (BOA);Head turns;Foam/compliant surface;5 reps   on Airex inside // bars - with UE support prn   Rockerboard Anterior/posterior    Sidestepping 2 reps   inside // bars   Turning Right;Left   2 reps to each side inside // bars   Marching Foam/compliant surface;Solid surface;Upper extremity assist 2;Intermittent upper extremity assist;Head turns;Static   marching on floor initially without head turns with bil. UE support on // bars; progressed to marching with horizontal head turns 5 reps, vertical head turns 5 reps; progressed to marching on Airex with EO without head turns   Other Standing Exercises Pt performed stepping down to floor from Airex with UE support on // bars 5 reps each foot for improved SLS on each leg on compliant surface    Other Standing Exercises Comments Performed alternating forward kicks - 5 reps each leg- standing on Airex inside // bars with min. bil. UE support on bars                PT Education - 12/12/20 1156     Education Details discussed progress with PT as gait and balance are WFL's with pt reporting continued feeling of light-headedness and some occasional double vision with head turns - etiology of light headedness unknown - plan D/C next session - pt agrees    Person(s) Educated Patient    Methods Explanation    Comprehension Verbalized understanding              PT Short Term Goals - 12/11/20 1428       PT SHORT TERM GOAL #1   Title Pt will subjectively report at least 25% improvement in dizziness. (all STGs due 11/14/20)    Baseline 11/13/20: no change    Status Not Met    Target Date 11/14/20      PT SHORT TERM GOAL #2   Title Improve  FGA score by  at least 3 points from baseline for increased safety with amb.    Baseline 11/13/20; baseline set this session with score of 9/30    Status Deferred    Target Date 11/14/20      PT SHORT TERM GOAL #3   Title Independent in HEP for vestibular and balance exercises.    Baseline 11/13/20: HEP initiated this session    Status Not Met               PT Long Term Goals - 12/11/20 1428       PT LONG TERM GOAL #1   Title Pt will increase FOTO score from 45/100 DPS to >/= 54/100 to demo improvement in dizziness.    Time 8    Period Weeks    Status New      PT LONG TERM GOAL #2   Title Pt will improve FGA by 5 points to reduce fall risk with ambulation and to demo improved balance.    Time 8    Period Weeks    Status New      PT LONG TERM GOAL #3   Title Pt will perform bed mobility including sit to both Rt and Lt sidelying, rolling and return to sitting with c/o dizziness </= 2/10 for all transitional movements.    Time 8    Period Weeks    Status New      PT LONG TERM GOAL #4   Title Independent in updated HEP for balance and vestibular exercises.    Time 8    Period Weeks    Status New                   Plan - 12/12/20 1151     Clinical Impression Statement Pt demonstrates good balance with standing on compliant surface with min. UE support on //bars but continues to c/o light-headedness and occasional double vision. BP was checked due to c/o light-headedness at start of session with reading WNL's.  Pt continues to amb. without device; plan D/C next session as balance and gait are WFL's with persistent feeling of light-headedness which is not changing with PT.  Pt to follow up with MD.    Personal Factors and Comorbidities Age;Comorbidity 2;Time since onset of injury/illness/exacerbation;Past/Current Experience    Comorbidities h/o vertigo, diplopia, DM type I, h/o MI, chronic CHF, OA, CAD, neurological gait dysfunction, OSA, B12 deficiency, chronic HA's, anxiety     Examination-Activity Limitations Bend;Bed Mobility;Locomotion Level;Stairs;Squat    Examination-Participation Restrictions Cleaning;Community Activity;Laundry;Shop;Meal Prep    Stability/Clinical Decision Making Evolving/Moderate complexity    Rehab Potential Good    PT Frequency 1x / week    PT Duration 8 weeks    PT Treatment/Interventions ADLs/Self Care Home Management;Neuromuscular re-education;Gait training;Stair training;Therapeutic activities;Therapeutic exercise;Balance training;Vestibular    PT Next Visit Plan Cont balance/vestibular exercises - gait speed impacted by pt wearing slides on 11-20-20 due to edema in feet/ankles    PT Home Exercise Plan Access Code: [email or web address] and Agree with Plan of Care Patient             Patient will benefit from skilled therapeutic intervention in order to improve the following deficits and impairments:  Dizziness, Difficulty walking, Decreased balance, Impaired sensation  Visit Diagnosis: Dizziness and giddiness  Other abnormalities of gait and mobility  Unsteadiness on feet     Problem List Patient Active Problem List   Diagnosis Date Noted   Bilateral ankle pain  11/11/2020   Dysuria 09/09/2020   Anxiety 09/09/2020   Coronary artery disease    Pelvic pain 10/30/2019   Chest pain at rest 08/24/2018   Arthralgia 08/01/2017   COPD with asthma ([place]) 05/04/2017   Angina pectoris ([place]) 01/05/2017   Insomnia 11/23/2016   Chronic nonintractable headache 11/23/2016   Pancreatic insufficiency 10/28/2016   Carotid artery disease ([place]) 05/19/2016   Fatigue 05/19/2016   Anemia 03/27/2016   Type 1 diabetes mellitus ([place]) 07/21/2015   B12 deficiency-monthly B12 injections 03/31/2015   OSA (obstructive sleep apnea) with hypoxia 11/20/2014   Fecal incontinence 08/28/2014   Neurologic gait dysfunction 08/28/2014   Falls 08/28/2014   Neck pain of over 3 months duration 08/28/2014   H/O total knee replacement 02/20/2014    Obesity (BMI 30-39.9) 11/12/2013   Chronic diastolic CHF (congestive heart failure) ([place]) 11/17/2012   Meniscus, lateral, anterior horn derangement 11/10/2011   Medial meniscus, posterior horn derangement 11/10/2011   Osteoarthritis of right knee 11/10/2011   Vertigo 10/05/2011   Labile hypertension 10/05/2011   CAD (coronary artery disease) 08/27/2010   MUSCLE CRAMPS 12/29/2009   History of myocardial infarction 11/19/2009   Extrinsic asthma 11/19/2009   GERD 11/19/2009   Cough 11/19/2009   Hyperlipidemia 11/18/2009    Alda Lea, PT 12/12/2020, 11:58 AM  [facility] [address] [place], [place], [address] Phone: [phone]   Fax:  [phone]  Name: [name] MRN: [id number] Date of Birth: 1940-09-05

## 2020-12-18 ENCOUNTER — Ambulatory Visit: Payer: Medicare Other | Admitting: Physical Therapy

## 2020-12-18 ENCOUNTER — Other Ambulatory Visit: Payer: Self-pay

## 2020-12-18 ENCOUNTER — Encounter: Payer: Self-pay | Admitting: Physical Therapy

## 2020-12-18 DIAGNOSIS — R42 Dizziness and giddiness: Secondary | ICD-10-CM | POA: Diagnosis not present

## 2020-12-18 DIAGNOSIS — R2681 Unsteadiness on feet: Secondary | ICD-10-CM

## 2020-12-19 NOTE — Therapy (Signed)
[facility] [address] [place], [place], [address] Phone: [phone]   Fax:  [phone]  Physical Therapy Treatment  Patient Details  Name: [name] MRN: [id number] Date of Birth: 1940-11-15 Referring Provider (PT): Dr. Billey Gosling   Encounter Date: 12/18/2020   PT End of Session - 12/19/20 1838     Visit Number 7    Number of Visits 9    Date for PT Re-Evaluation 12/12/20    Authorization Type UHC Medicare    Authorization Time Period 10-16-20 - 12-16-20    PT Start Time 1405    PT Stop Time 6283    PT Time Calculation (min) 40 min    Activity Tolerance Patient tolerated treatment well    Behavior During Therapy [facility] for tasks assessed/performed             Past Medical History:  Diagnosis Date   Adenomatous colon polyp    Allergy    Anxiety    Asthma    Carotid artery disease ([place])    carotid US 02/2017: bilat ICA 1-39%   Chronic diastolic CHF    Echo 07/6292: EF 65-70, Gr 2 DD, mild MS (mean 5), PASP 44   COPD    pt is unsure if has been officially diagnosed   Coronary artery disease    CABG '09- cathed 12/09, 9/10, 6/11, 3/14 and 12/13/16- medical Rx // cath 12/2016 - 2/3 grafts patent >> med Rx // Myoview 12/17: low risk   Diabetes mellitus    Gastroesophageal reflux disease    Hiatal hernia    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   Hyperlipidemia    Hypertension    PONV (postoperative nausea and vomiting)    Schatzki's ring    Shoulder injury    resolved after shoulder surgery   Sleep apnea    not on cpap    Past Surgical History:  Procedure Laterality Date   ABDOMINAL HYSTERECTOMY     APPENDECTOMY     came out with Hysterectomy   CARDIAC CATHETERIZATION  07/23/2009   EF 60%   CARDIAC CATHETERIZATION  10/11/2008   CARDIAC CATHETERIZATION  03/01/2007   EF 75-80%   CARDIOVASCULAR STRESS TEST  11/15/2007   EF 60%   COLONOSCOPY     CORONARY ARTERY BYPASS GRAFT     SEVERELY  DISEASED SAPHENOUS VEIN GRAFT TO THE RIGHT CORONARY ARTERY BUT WITH FAIRLY WELL PRESERVED FLOW TO THE DISTAL RIGHT CORONARY ARTERY FROM THE NATIVE CIRCULATION-RESTART  CATH IN JUNE 2000, REVEALS MILD/MODERATE  CAD WITH GOOD FLOW DOWN HER LAD   ESOPHAGOGASTRODUODENOSCOPY     EYE SURGERY     bilateral cataract surgery with lens implant   LEFT HEART CATH AND CORS/GRAFTS ANGIOGRAPHY N/A 12/14/2016   Procedure: LEFT HEART CATH AND CORS/GRAFTS ANGIOGRAPHY;  Surgeon: Jettie Booze, MD;  Location: [facility];  Service: Cardiovascular;  Laterality: N/A;   lense removal Left    POLYPECTOMY     ROTATOR CUFF REPAIR     right and left   TONSILLECTOMY     age 46   TOTAL KNEE ARTHROPLASTY Right 02/20/2014   Procedure: RIGHT TOTAL KNEE ARTHROPLASTY;  Surgeon: Tobi Bastos, MD;  Location: [facility];  Service: Orthopedics;  Laterality: Right;   tumor removed kidney     UPPER GASTROINTESTINAL ENDOSCOPY     US ECHOCARDIOGRAPHY  03/08/2008   EF 55-60%    There were no vitals filed  for this visit.   Subjective Assessment - 12/18/20 1410     Subjective Pt reports she feels unsteady on her feet at times - is not using cane at today's session but states she uses it prn.  Pt states she wants appt with her PCP (Dr. Quay Burow) to see if she can help get her neurology appt moved up    Pertinent History h/o vertigo; DM type I, h/o MI, chronic CHF, OA, CAD, neurological gait dysfunction, OSA, B12 deficiency, chronic HA's, anxiety    Limitations Walking;House hold activities    Patient Stated Goals resolve the vertigo - get rid of the headache    Currently in Pain? No/denies    Pain Onset More than a month ago                               [facility] Adult PT Treatment/Exercise - 12/19/20 0001       Transfers   Transfers Sit to Stand;Stand to Sit    Sit to Stand 6: Modified independent (Device/Increase time)      Ambulation/Gait   Ambulation/Gait Yes    Ambulation/Gait Assistance 5:  Supervision    Ambulation Distance (Feet) 200 Feet    Assistive device None    Gait Pattern Within Functional Limits    Ambulation Surface Level;Indoor    Gait Comments pt performed horizontal and vertical head turns 50' x 2 reps each                 Balance Exercises - 12/19/20 0001       Balance Exercises: Standing   Standing Eyes Opened Wide (BOA);Head turns;Foam/compliant surface;5 reps   horizontal and vertical head turns   Standing Eyes Closed Wide (BOA);Head turns;Foam/compliant surface;5 reps   on Airex inside // bars - with UE support prn   Rockerboard Anterior/posterior    Marching Foam/compliant surface;Solid surface;Upper extremity assist 2;Intermittent upper extremity assist;Head turns;Static   marching on floor initially without head turns with bil. UE support on // bars; progressed to marching with horizontal head turns 5 reps, vertical head turns 5 reps; progressed to marching on Airex with EO without head turns   Other Standing Exercises Pt performed stepping down to floor from Airex with UE support on // bars 5 reps each foot for improved SLS on each leg on compliant surface                  PT Short Term Goals - 12/18/20 1415       PT SHORT TERM GOAL #1   Title Pt will subjectively report at least 25% improvement in dizziness. (all STGs due 11/14/20)    Baseline 11/13/20: no change    Status Not Met    Target Date 11/14/20      PT SHORT TERM GOAL #2   Title Improve FGA score by at least 3 points from baseline for increased safety with amb.    Baseline 11/13/20; baseline set this session with score of 9/30    Status Deferred    Target Date 11/14/20      PT SHORT TERM GOAL #3   Title Independent in HEP for vestibular and balance exercises.    Baseline 11/13/20: HEP initiated this session    Status Not Met               PT Long Term Goals - 12/18/20 1415       PT LONG TERM  GOAL #1   Title Pt will increase FOTO score from 45/100 DPS to >/=  54/100 to demo improvement in dizziness.    Time 8    Period Weeks    Status On-going      PT LONG TERM GOAL #2   Title Pt will improve FGA by 5 points to reduce fall risk with ambulation and to demo improved balance.    Time 8    Period Weeks    Status On-going      PT LONG TERM GOAL #3   Title Pt will perform bed mobility including sit to both Rt and Lt sidelying, rolling and return to sitting with c/o dizziness </= 2/10 for all transitional movements.    Baseline Pt reports she continues to have dizziness with rolling to both sides and with sit to stand. 12-18-20    Time 8    Period Weeks    Status Not Met      PT LONG TERM GOAL #4   Title Independent in updated HEP for balance and vestibular exercises.    Time 8    Period Weeks    Status Achieved                   Plan - 12/19/20 1911     Clinical Impression Statement Pt demonstrates good standing balance during PT session and is ambulating without use of cane to session today.  Pt continues to c/o dizziness which appears to be multifactorial in etiology.  Plan is to place pt on hold until she sees ENT, Dr. Redmond Baseman or is seen earlier than scheduled Feb. appt with Dr. Tomi Likens.  Pt has plateaued in maximizing functional progress in PTat this time with report that dizziness is not changing.    Personal Factors and Comorbidities Age;Comorbidity 2;Time since onset of injury/illness/exacerbation;Past/Current Experience    Comorbidities h/o vertigo, diplopia, DM type I, h/o MI, chronic CHF, OA, CAD, neurological gait dysfunction, OSA, B12 deficiency, chronic HA's, anxiety    Examination-Activity Limitations Bend;Bed Mobility;Locomotion Level;Stairs;Squat    Examination-Participation Restrictions Cleaning;Community Activity;Laundry;Shop;Meal Prep    Stability/Clinical Decision Making Evolving/Moderate complexity    Rehab Potential Good    PT Frequency 1x / week    PT Duration 8 weeks    PT Treatment/Interventions ADLs/Self Care  Home Management;Neuromuscular re-education;Gait training;Stair training;Therapeutic activities;Therapeutic exercise;Balance training;Vestibular    PT Next Visit Plan Hold for 30 days -will D/C if pt does not return    PT Home Exercise Plan Access Code: [email or web address]    Consulted and Agree with Plan of Care Patient             Patient will benefit from skilled therapeutic intervention in order to improve the following deficits and impairments:  Dizziness, Difficulty walking, Decreased balance, Impaired sensation  Visit Diagnosis: Dizziness and giddiness  Unsteadiness on feet     Problem List Patient Active Problem List   Diagnosis Date Noted   Bilateral ankle pain 11/11/2020   Dysuria 09/09/2020   Anxiety 09/09/2020   Coronary artery disease    Pelvic pain 10/30/2019   Chest pain at rest 08/24/2018   Arthralgia 08/01/2017   COPD with asthma ([place]) 05/04/2017   Angina pectoris ([place]) 01/05/2017   Insomnia 11/23/2016   Chronic nonintractable headache 11/23/2016   Pancreatic insufficiency 10/28/2016   Carotid artery disease ([place]) 05/19/2016   Fatigue 05/19/2016   Anemia 03/27/2016   Type 1 diabetes mellitus ([place]) 07/21/2015   B12 deficiency-monthly B12 injections 03/31/2015   OSA (obstructive  sleep apnea) with hypoxia 11/20/2014   Fecal incontinence 08/28/2014   Neurologic gait dysfunction 08/28/2014   Falls 08/28/2014   Neck pain of over 3 months duration 08/28/2014   H/O total knee replacement 02/20/2014   Obesity (BMI 30-39.9) 11/12/2013   Chronic diastolic CHF (congestive heart failure) ([place]) 11/17/2012   Meniscus, lateral, anterior horn derangement 11/10/2011   Medial meniscus, posterior horn derangement 11/10/2011   Osteoarthritis of right knee 11/10/2011   Vertigo 10/05/2011   Labile hypertension 10/05/2011   CAD (coronary artery disease) 08/27/2010   MUSCLE CRAMPS 12/29/2009   History of myocardial infarction 11/19/2009   Extrinsic asthma 11/19/2009   GERD  11/19/2009   Cough 11/19/2009   Hyperlipidemia 11/18/2009    Alda Lea, PT 12/19/2020, 7:16 PM  [facility] [address] [place], [place], [address] Phone: [phone]   Fax:  [phone]  Name: [name] MRN: [id number] Date of Birth: August 18, 1940

## 2020-12-26 ENCOUNTER — Telehealth: Payer: Self-pay

## 2020-12-26 NOTE — Telephone Encounter (Signed)
Attempted to call patient to reschedule missed appointment with no answer. Message left for return call. Also attempted to call grand daughter with no answer. Messages left

## 2020-12-31 ENCOUNTER — Other Ambulatory Visit: Payer: Self-pay

## 2020-12-31 DIAGNOSIS — Z006 Encounter for examination for normal comparison and control in clinical research program: Secondary | ICD-10-CM

## 2020-12-31 NOTE — Research (Signed)
Patient was seen in clinic for ORION 4 month 33  for IP injection. Denies any changes in medication, no recent hospitalizations and no adverse events noted. IP given in the right upper quadrant with no complications nor complaints. Patient will return Jun 30, 2021 for month 39

## 2021-01-04 ENCOUNTER — Encounter: Payer: Self-pay | Admitting: Cardiovascular Disease

## 2021-01-04 NOTE — Progress Notes (Signed)
This encounter was created in error - please disregard.

## 2021-01-05 ENCOUNTER — Encounter: Payer: Medicare Other | Admitting: Cardiovascular Disease

## 2021-01-06 ENCOUNTER — Telehealth: Payer: Self-pay | Admitting: *Deleted

## 2021-01-06 NOTE — Telephone Encounter (Signed)
   [facility] Pre-operative Risk Assessment    Patient Name: [name]  DOB: 1940-05-13 MRN: [id number]  HEARTCARE STAFF:  - IMPORTANT!!!!!! Under Visit Info/Reason for Call, type in Other and utilize the format Clearance MM/DD/YY or Clearance TBD. Do not use dashes or single digits. - Please review there is not already an duplicate clearance open for this procedure. - If request is for dental extraction, please clarify the # of teeth to be extracted. - If the patient is currently at the dentist's office, call Pre-Op Callback Staff (MA/nurse) to input urgent request.  - If the patient is not currently in the dentist office, please route to the Pre-Op pool.  Request for surgical clearance:  What type of surgery is being performed?  TOTAL REVERSE SHOULDER ARTHROPOLASTY  When is this surgery scheduled?   TBD  What type of clearance is required (medical clearance vs. Pharmacy clearance to hold med vs. Both)?  MEDICAL  Are there any medications that need to be held prior to surgery and how long?  N/A  Practice name and name of physician performing surgery?  [facility] / DR. Veverly Fells  What is the office phone number?  [phone]   7.   What is the office fax number?  [phone] ATTN;  ASHLEY HINTON  8.   Anesthesia type (None, local, MAC, general) ?  GENERAL   Jeanann Lewandowsky 01/06/2021, 10:26 AM  _________________________________________________________________   (provider comments below)

## 2021-01-06 NOTE — Telephone Encounter (Signed)
Dr. Acie Fredrickson, [place] saw this patient yesterday. Can you clear her for shoulder surgery? Can she hold ASA and plavix?

## 2021-01-09 NOTE — Telephone Encounter (Signed)
Patient has an appointment for preoperative cardiac evaluation with Dr. Acie Fredrickson on 02/16/2021. Patient was scheduled to be seen yesterday 01/08/2021 but missed her appointment.  Dr. Acie Fredrickson is aware of preoperative cardiac evaluation.  I will defer cardiac evaluation to him at that time.  I will remove patient from preoperative pool.  Jossie Ng. Jahnae Mcadoo NP-C    01/09/2021, 10:02 AM [place] [address] Office [phone] Fax [phone]

## 2021-01-19 ENCOUNTER — Ambulatory Visit: Payer: Medicare Other | Admitting: Neurology

## 2021-01-19 NOTE — Progress Notes (Unsigned)
Virtual Visit via Video Note  I connected with [name] on 01/19/21 at  1:20 PM EST by a video enabled telemedicine application and verified that I am speaking with the correct person using two identifiers.   I discussed the limitations of evaluation and management by telemedicine and the availability of in person appointments. The patient expressed understanding and agreed to proceed.  Present for the visit:  Myself, Dr Billey Gosling, [name].  The patient is currently at home and I am in the office.    No referring provider.    History of Present Illness: This is an acute visit for changes and health concerns.    Social History   Socioeconomic History   Marital status: Widowed    Spouse name: Not on file   Number of children: 4   Years of education: Doctorate   Highest education level: Doctorate  Occupational History   Occupation: Retired  Tobacco Use   Smoking status: Former    Packs/day: 0.50    Years: 21.00    Pack years: 10.50    Types: Cigarettes    Start date: 02/09/1955    Quit date: 02/09/1976    Years since quitting: 44.9   Smokeless tobacco: Never  Vaping Use   Vaping Use: Never used  Substance and Sexual Activity   Alcohol use: No    Alcohol/week: 0.0 standard drinks   Drug use: No   Sexual activity: Never  Other Topics Concern   Not on file  Social History Narrative   Lives alone.  One story home.  Has 4 children.  Education: doctorate in theology.    Caffeine use: Drinks 1 cup coffee/day      Originally from [place]. Previously has lived in [place]. Prior travel to [place], [place], [place], [place], [place], [place], [place], & [place]. Previously worked in [employer] She has a dog currently. No bird, mold, or hot tub exposure. She also pastors a church.    Social Determinants of Health   Financial Resource Strain: Low Risk    Difficulty of Paying Living Expenses: Not hard at all  Food Insecurity: No Food Insecurity   Worried About Charity fundraiser  in the Last Year: Never true   [place] in the Last Year: Never true  Transportation Needs: No Transportation Needs   Lack of Transportation (Medical): No   Lack of Transportation (Non-Medical): No  Physical Activity: Inactive   Days of Exercise per Week: 0 days   Minutes of Exercise per Session: 0 min  Stress: No Stress Concern Present   Feeling of Stress : Not at all  Social Connections: Moderately Integrated   Frequency of Communication with Friends and Family: More than three times a week   Frequency of Social Gatherings with Friends and Family: More than three times a week   Attends Religious Services: More than 4 times per year   Active Member of [employer] or Organizations: Yes   Attends Archivist Meetings: More than 4 times per year   Marital Status: Widowed     Observations/Objective: Appears well in NAD   Assessment and Plan:  See Problem List for Assessment and Plan of chronic medical problems.   Follow Up Instructions:    I discussed the assessment and treatment plan with the patient. The patient was provided an opportunity to ask questions and all were answered. The patient agreed with the plan and demonstrated an understanding of the instructions.   The  patient was advised to call back or seek an in-person evaluation if the symptoms worsen or if the condition fails to improve as anticipated.    Binnie Rail, MD

## 2021-01-20 ENCOUNTER — Other Ambulatory Visit: Payer: Self-pay

## 2021-01-20 ENCOUNTER — Telehealth: Payer: Medicare Other | Admitting: Internal Medicine

## 2021-01-27 ENCOUNTER — Other Ambulatory Visit: Payer: Self-pay | Admitting: Pulmonary Disease

## 2021-01-27 DIAGNOSIS — J449 Chronic obstructive pulmonary disease, unspecified: Secondary | ICD-10-CM

## 2021-01-28 ENCOUNTER — Other Ambulatory Visit: Payer: Self-pay | Admitting: Internal Medicine

## 2021-02-06 LAB — HM MAMMOGRAPHY

## 2021-02-15 ENCOUNTER — Encounter: Payer: Self-pay | Admitting: Cardiovascular Disease

## 2021-02-15 NOTE — Progress Notes (Signed)
This encounter was created in error - please disregard.

## 2021-02-16 ENCOUNTER — Encounter: Payer: Medicare Other | Admitting: Cardiovascular Disease

## 2021-02-20 ENCOUNTER — Other Ambulatory Visit: Payer: Self-pay | Admitting: Pulmonary Disease

## 2021-02-20 ENCOUNTER — Other Ambulatory Visit: Payer: Self-pay | Admitting: Cardiovascular Disease

## 2021-02-20 DIAGNOSIS — J449 Chronic obstructive pulmonary disease, unspecified: Secondary | ICD-10-CM

## 2021-03-12 ENCOUNTER — Encounter: Payer: Self-pay | Admitting: Internal Medicine

## 2021-03-12 ENCOUNTER — Other Ambulatory Visit: Payer: Self-pay | Admitting: Internal Medicine

## 2021-03-12 NOTE — Progress Notes (Signed)
Outside notes received. Information abstracted. Notes sent to scan.  

## 2021-03-15 NOTE — Progress Notes (Signed)
Subjective:    Patient ID: [name], female    DOB: 03-12-1940, 81 y.o.   MRN: [id number]   This visit occurred during the SARS-CoV-2 public health emergency.  Safety protocols were in place, including screening questions prior to the visit, additional usage of staff PPE, and extensive cleaning of exam room while observing appropriate contact time as indicated for disinfecting solutions.    HPI She is here for a physical exam.   Dizziness is still there - PT has not helped.  It has been bad for the past two weeks.  I had ordered referrals for neurology, ENT and neuro-ophthalmology.  She did get calls about a couple of them, but they were not able to give her appointments at that time or the appointment had to be canceled and they told her they would call her back but never did.  So, she has not seen any of them.  She continues to have dizziness.  The dizziness typically occurs when she is walking or standing-not related to head movements or being in bed or changing positions.  She does state blurry vision after reading long parades of time.  She states balance problems.  She has had severe muscle cramping for a long time.    Medications and allergies reviewed with patient and updated if appropriate.  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   Angina pectoris ([place]) 01/05/2017   Insomnia 11/23/2016   Chronic nonintractable headache 11/23/2016   Pancreatic insufficiency 10/28/2016   Carotid artery disease ([place]) 05/19/2016   Fatigue 05/19/2016   Anemia 03/27/2016   Type 1 diabetes mellitus ([place]) 07/21/2015   B12 deficiency-monthly B12 injections 03/31/2015   OSA (obstructive sleep apnea) with hypoxia 11/20/2014   Fecal incontinence 08/28/2014    Neurologic gait dysfunction 08/28/2014   Falls 08/28/2014   Neck pain of over 3 months duration 08/28/2014   H/O total knee replacement 02/20/2014   Obesity (BMI 30-39.9) 11/12/2013   Chronic diastolic CHF (congestive heart failure) ([place]) 11/17/2012   Meniscus, lateral, anterior horn derangement 11/10/2011   Medial meniscus, posterior horn derangement 11/10/2011   Osteoarthritis of right knee 11/10/2011   Vertigo 10/05/2011   Labile hypertension 10/05/2011   CAD (coronary artery disease) 08/27/2010   MUSCLE CRAMPS 12/29/2009   History of myocardial infarction 11/19/2009   Extrinsic asthma 11/19/2009   GERD 11/19/2009   Cough 11/19/2009   Hyperlipidemia 11/18/2009    Current Outpatient Medications on File Prior to Visit  Medication Sig Dispense Refill   albuterol (PROVENTIL HFA;VENTOLIN HFA) 108 (90 BASE) MCG/ACT inhaler Inhale 2 puffs into the lungs every 6 (six) hours as needed. For shortness of breath. 1 Inhaler 0   albuterol (PROVENTIL) (2.5 MG/3ML) 0.083% nebulizer solution USE 1 VIAL IN NEBULIZER 4 TIMES DAILY (Patient taking differently: Take 2.5 mg by nebulization 4 (four) times daily.) 360 mL 1   alum & mag hydroxide-simeth (MAALOX/MYLANTA) 200-200-20 MG/5ML suspension Take 30 mLs as needed by mouth for indigestion or heartburn.     aspirin EC 81 MG tablet Take 1 tablet (81 mg total) by mouth daily.     baclofen (LIORESAL) 20 MG tablet TAKE 2 TABLETS BY MOUTH 2 TIMES DAILY. 360 tablet 2   Bempedoic Acid-Ezetimibe (  NEXLIZET) 180-10 MG TABS Take 1 tablet by mouth daily. 90 tablet 3   Blood Glucose Monitoring Suppl (ONE TOUCH ULTRA MINI) w/Device KIT 3 (three) times daily. for testing  0   calcium carbonate (OS-CAL) 1250 (500 Ca) MG chewable tablet Chew 1 tablet by mouth as needed for heartburn.     calcium carbonate (TUMS - DOSED IN MG ELEMENTAL CALCIUM) 500 MG chewable tablet Chew 2 tablets daily as needed by mouth for indigestion or heartburn.     Cholecalciferol (VITAMIN D)  2000 UNITS tablet Take 2,000 Units by mouth daily.     clopidogrel (PLAVIX) 75 MG tablet Take 75 mg by mouth daily.     Continuous Blood Gluc Sensor (FREESTYLE LIBRE 14 DAY SENSOR) MISC See admin instructions.     Cyanocobalamin (VITAMIN B-12 IJ) Inject 1,000 mcg as directed every 30 (thirty) days.     cyclobenzaprine (FLEXERIL) 5 MG tablet Take 5 mg by mouth as needed for muscle spasms.     diclofenac sodium (VOLTAREN) 1 % GEL Apply 2 g topically 4 (four) times daily as needed (muscle pain).     diltiazem (CARDIZEM CD) 300 MG 24 hr capsule TAKE 1 CAPSULE (300 MG TOTAL) BY MOUTH DAILY. MUST KEEP SCHEDULED APPT FOR FUTURE REFILLS 90 capsule 1   famotidine (PEPCID) 40 MG tablet Take 1 tablet (40 mg total) by mouth daily as needed. (Patient taking differently: Take 40 mg by mouth daily as needed for heartburn or indigestion.) 90 tablet 1   fexofenadine (ALLEGRA) 180 MG tablet TAKE 1 TABLET BY MOUTH EVERY DAY (Patient taking differently: Take 180 mg by mouth as needed for allergies.) 90 tablet 3   Fluticasone-Salmeterol (ADVAIR DISKUS) 250-50 MCG/DOSE AEPB Inhale 1 puff into the lungs 2 (two) times daily. (Patient taking differently: Inhale 1 puff into the lungs as needed (shortness of breath).) 60 each 5   furosemide (LASIX) 40 MG tablet Take 1 tablet (40 mg total) by mouth daily. 90 tablet 3   HYDROcodone-acetaminophen (NORCO/VICODIN) 5-325 MG tablet Take 1 tablet by mouth every 6 (six) hours as needed. 20 tablet 0   insulin lispro protamine-insulin lispro (HUMALOG 75/25) (75-25) 100 UNIT/ML SUSP Inject 15-30 Units into the skin See admin instructions. Inject 15 units in the morning, 5 units at lunch, and 30 units at dinner.     isosorbide mononitrate (IMDUR) 60 MG 24 hr tablet Take one tablet ( 60 mg ) twice daily 8 hours apart. (Patient taking differently: Take 60 mg by mouth 2 (two) times daily. Take 8 hours apart.) 180 tablet 3   lisinopril (ZESTRIL) 20 MG tablet Take 20 mg by mouth daily.      loperamide (IMODIUM) 2 MG capsule Take 2 mg by mouth as needed for diarrhea or loose stools.      meclizine (ANTIVERT) 25 MG tablet TAKE ONE TABLET BY MOUTH TWICE DAILY AS NEEDED FOR DIZZINESS (Patient taking differently: Take 25 mg by mouth as needed for dizziness.) 30 tablet 0   metoprolol succinate (TOPROL-XL) 100 MG 24 hr tablet TAKE 1 TABLET BY MOUTH EVERY DAY 90 tablet 3   Multiple Vitamin (MULTIVITAMIN WITH MINERALS) TABS Take 1 tablet by mouth daily.     Nebulizers (COMPRESSOR/NEBULIZER) MISC Use with albuterol 1 each 0   nitroGLYCERIN (NITROSTAT) 0.4 MG SL tablet Place 0.4 mg under the tongue every 5 (five) minutes as needed for chest pain.     nortriptyline (PAMELOR) 50 MG capsule TAKE 2 CAPSULES (100 MG TOTAL) BY MOUTH AT BEDTIME. [place]  capsule 1   ONE TOUCH ULTRA TEST test strip 1 each by Other route 2 (two) times daily.  3   ONETOUCH DELICA LANCETS 16S MISC 3 (three) times daily. for testing  0   RABEprazole (ACIPHEX) 20 MG tablet Take 20 mg by mouth daily.     Spacer/Aero-Holding Chambers (AEROCHAMBER MV) inhaler Use as instructed 1 each 0   spironolactone (ALDACTONE) 25 MG tablet Take 1 tablet (25 mg total) by mouth daily. 90 tablet 3   Study - ORION 4 - inclisiran 300 mg/1.75m or placebo SQ injection (PI-[place]uc[place]) Inject 300 mg into the skin every 6 (six) months.     Tiotropium Bromide Monohydrate (SPIRIVA RESPIMAT) 2.5 MCG/ACT AERS Inhale 2 puffs into the lungs daily. 4 g 6   Current Facility-Administered Medications on File Prior to Visit  Medication Dose Route Frequency Provider Last Rate Last Admin   cyanocobalamin ((VITAMIN B-12)) injection 1,000 mcg  1,000 mcg Intramuscular Q30 days BBinnie Rail MD   1,000 mcg at 03/26/16 1658    Past Medical History:  Diagnosis Date   Adenomatous colon polyp    Allergy    Anxiety    Asthma    Carotid artery disease ([place]    carotid [place]: bilat ICA 1-39%   Chronic diastolic CHF    Echo 11/6299 EF 65-70, Gr 2 DD, mild MS (mean  5), PASP 44   COPD    pt is unsure if has been officially diagnosed   Coronary artery disease    CABG '09- cathed 12/09, 9/10, 6/11, 3/14 and 12/13/16- medical Rx // cath 12/2016 - 2/3 grafts patent >> med Rx // Myoview 12/17: low risk   Diabetes mellitus    Gastroesophageal reflux disease    Hiatal hernia    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   Hyperlipidemia    Hypertension    PONV (postoperative nausea and vomiting)    Schatzki's ring    Shoulder injury    resolved after shoulder surgery   Sleep apnea    not on cpap    Past Surgical History:  Procedure Laterality Date   ABDOMINAL HYSTERECTOMY     APPENDECTOMY     came out with Hysterectomy   CARDIAC CATHETERIZATION  07/23/2009   EF 60%   CARDIAC CATHETERIZATION  10/11/2008   CARDIAC CATHETERIZATION  03/01/2007   EF 75-80%   CARDIOVASCULAR STRESS TEST  11/15/2007   EF 60%   COLONOSCOPY     CORONARY ARTERY BYPASS GRAFT     SEVERELY DISEASED SAPHENOUS VEIN GRAFT TO THE RIGHT CORONARY ARTERY BUT WITH FAIRLY WELL PRESERVED FLOW TO THE DISTAL RIGHT CORONARY ARTERY FROM THE NATIVE CIRCULATION-RESTART  CATH IN JUNE 2000, REVEALS MILD/MODERATE  CAD WITH GOOD FLOW DOWN HER LAD   ESOPHAGOGASTRODUODENOSCOPY     EYE SURGERY     bilateral cataract surgery with lens implant   LEFT HEART CATH AND CORS/GRAFTS ANGIOGRAPHY N/A 12/14/2016   Procedure: LEFT HEART CATH AND CORS/GRAFTS ANGIOGRAPHY;  Surgeon: VJettie Booze MD;  [facility];  Service: Cardiovascular;  Laterality: N/A;   lense removal Left    POLYPECTOMY     ROTATOR CUFF REPAIR     right and left   TONSILLECTOMY     age 89  TOTAL KNEE ARTHROPLASTY Right 02/20/2014   Procedure: RIGHT TOTAL KNEE ARTHROPLASTY;  Surgeon: RTobi Bastos MD;  Location: [facility];  Service: Orthopedics;  Laterality: Right;   tumor removed kidney  UPPER GASTROINTESTINAL ENDOSCOPY     US ECHOCARDIOGRAPHY  03/08/2008   EF 55-60%    Social History   Socioeconomic  History   Marital status: Widowed    Spouse name: Not on file   Number of children: 4   Years of education: Doctorate   Highest education level: Doctorate  Occupational History   Occupation: Retired  Tobacco Use   Smoking status: Former    Packs/day: 0.50    Years: 21.00    Pack years: 10.50    Types: Cigarettes    Start date: 02/09/1955    Quit date: 02/09/1976    Years since quitting: 45.1   Smokeless tobacco: Never  Vaping Use   Vaping Use: Never used  Substance and Sexual Activity   Alcohol use: No    Alcohol/week: 0.0 standard drinks   Drug use: No   Sexual activity: Never  Other Topics Concern   Not on file  Social History Narrative   Lives alone.  One story home.  Has 4 children.  Education: doctorate in theology.    Caffeine use: Drinks 1 cup coffee/day      Originally from . Previously has lived in [place]. Prior travel to [place], [place], [place], [place], [place], [place], [place], & [place]. Previously worked in [employer] She has a dog currently. No bird, mold, or hot tub exposure. She also pastors a church.    Social Determinants of Health   Financial Resource Strain: Low Risk    Difficulty of Paying Living Expenses: Not hard at all  Food Insecurity: No Food Insecurity   Worried About Charity fundraiser in the Last Year: Never true   [place] in the Last Year: Never true  Transportation Needs: No Transportation Needs   Lack of Transportation (Medical): No   Lack of Transportation (Non-Medical): No  Physical Activity: Inactive   Days of Exercise per Week: 0 days   Minutes of Exercise per Session: 0 min  Stress: No Stress Concern Present   Feeling of Stress : Not at all  Social Connections: Moderately Integrated   Frequency of Communication with Friends and Family: More than three times a week   Frequency of Social Gatherings with Friends and Family: More than three times a week   Attends Religious Services: More than 4 times per year   Active Member  of [employer] or Organizations: Yes   Attends Archivist Meetings: More than 4 times per year   Marital Status: Widowed    Family History  Problem Relation Age of Onset   Heart disease Maternal Grandfather    Heart failure Maternal Grandfather    Diabetes Maternal Grandfather    Heart attack Father    Diabetes Mother    Rheum arthritis Sister    Emphysema Paternal Uncle    Esophageal cancer Brother 4       she said he was born with it   Emphysema Paternal Aunt    Healthy Child    Neuropathy Neg Hx    Multiple sclerosis Neg Hx    Colon cancer Neg Hx    Colon polyps Neg Hx    Rectal cancer Neg Hx    Stomach cancer Neg Hx     Review of Systems  Constitutional:  Negative for chills and fever.  HENT:  Negative for trouble swallowing.   Eyes:  Negative for visual disturbance (last week saw double).       If she read a lot she  will get blurry vision  Respiratory:  Negative for cough, shortness of breath and wheezing.   Cardiovascular:  Negative for chest pain (chest pressure), palpitations and leg swelling.  Gastrointestinal:  Negative for abdominal pain, blood in stool, constipation, diarrhea and nausea.       No gerd  Genitourinary:  Positive for dysuria. Negative for frequency and hematuria.  Musculoskeletal:  Positive for gait problem (poor balance). Negative for arthralgias.       Muscle cramping  Skin:  Negative for rash.  Neurological:  Positive for dizziness (intermittent), light-headedness (intermittent) and headaches.  Psychiatric/Behavioral:  Negative for dysphoric mood. The patient is nervous/anxious.       Objective:   Vitals:   03/16/21 1508  BP: 110/62  Pulse: 80  Temp: 98.4 F (36.9 C)   Filed Weights   03/16/21 1508  Weight: 185 lb 12.8 oz (84.3 kg)   Body mass index is 33.98 kg/m.  BP Readings from Last 3 Encounters:  03/16/21 110/62  12/11/20 132/76  11/13/20 (!) 150/90    Wt Readings from Last 3 Encounters:  03/16/21 185 lb 12.8 oz  (84.3 kg)  11/11/20 194 lb (88 kg)  10/10/20 191 lb 6.4 oz (86.8 kg)    Depression screen [facility] 2/9 09/29/2020 10/30/2019 08/01/2018 01/24/2018  Decreased Interest 0 0 0 0  Down, Depressed, Hopeless 0 0 0 1  PHQ - 2 Score 0 0 0 1  Altered sleeping - - - 2  Tired, decreased energy - - - 1  Change in appetite - - - 0  Feeling bad or failure about yourself  - - - 0  Trouble concentrating - - - 0  Moving slowly or fidgety/restless - - - 0  Suicidal thoughts - - - 0  PHQ-9 Score - - - 4  Difficult doing work/chores - - - Not difficult at all  Some recent data might be hidden     No flowsheet data found.     Physical Exam Constitutional: She appears well-developed and well-nourished. No distress.  HENT:  Head: Normocephalic and atraumatic.  Right Ear: External ear normal. Normal ear canal and TM Left Ear: External ear normal.  Normal ear canal and TM Mouth/Throat: Oropharynx is clear and moist.  Eyes: Conjunctivae and EOM are normal.  Neck: Neck supple. No tracheal deviation present. No thyromegaly present.  No carotid bruit  Cardiovascular: Normal rate, regular rhythm and normal heart sounds.   No murmur heard.  No edema. Pulmonary/Chest: Effort normal and breath sounds normal. No respiratory distress. She has no wheezes. She has no rales.  Breast: deferred   Abdominal: Soft. She exhibits no distension. There is no tenderness.  Lymphadenopathy: She has no cervical adenopathy.  Skin: Skin is warm and dry. She is not diaphoretic.  Psychiatric: She has a normal mood and affect. Her behavior is normal.     Lab Results  Component Value Date   WBC 6.2 11/11/2020   HGB 11.9 (L) 11/11/2020   HCT 37.0 11/11/2020   PLT 323.0 11/11/2020   GLUCOSE 127 (H) 11/11/2020   CHOL 108 10/21/2020   TRIG 125 10/21/2020   HDL 27 (L) 10/21/2020   LDLCALC 58 10/21/2020   ALT 10 11/11/2020   AST 17 11/11/2020   NA 143 11/11/2020   K 4.2 11/11/2020   CL 102 11/11/2020   CREATININE 0.84  11/11/2020   BUN 22 11/11/2020   CO2 34 (H) 11/11/2020   TSH 0.76 02/12/2019   INR WILL FOLLOW 03/07/2019  HGBA1C 8.1 (H) 12/19/2019   MICROALBUR 2.0 10/30/2019         Assessment & Plan:   Physical exam: Screening blood work  ordered Exercise  not regular Weight  encouraged weight loss Substance abuse  none   Reviewed recommended immunizations.   Health Maintenance  Topic Date Due   DEXA SCAN  Never done   FOOT EXAM  01/25/2019   OPHTHALMOLOGY EXAM  02/06/2020   HEMOGLOBIN A1C  06/17/2020   Zoster Vaccines- Shingrix (1 of 2) 06/13/2021 (Originally [birth year]/30/1988)   TETANUS/TDAP  03/16/2022 (Originally 09/30/1959)   Pneumonia Vaccine 38+ Years old  Completed   INFLUENZA VACCINE  Completed   COVID-19 Vaccine  Completed   HPV VACCINES  Aged Out          See Problem List for Assessment and Plan of chronic medical problems.

## 2021-03-16 ENCOUNTER — Ambulatory Visit (INDEPENDENT_AMBULATORY_CARE_PROVIDER_SITE_OTHER): Payer: Medicare Other | Admitting: Internal Medicine

## 2021-03-16 ENCOUNTER — Encounter: Payer: Self-pay | Admitting: Internal Medicine

## 2021-03-16 ENCOUNTER — Other Ambulatory Visit: Payer: Self-pay

## 2021-03-16 VITALS — BP 110/62 | HR 80 | Temp 98.4°F | Ht 62.0 in | Wt 185.8 lb

## 2021-03-16 DIAGNOSIS — I5032 Chronic diastolic (congestive) heart failure: Secondary | ICD-10-CM | POA: Diagnosis not present

## 2021-03-16 DIAGNOSIS — E538 Deficiency of other specified B group vitamins: Secondary | ICD-10-CM

## 2021-03-16 DIAGNOSIS — R300 Dysuria: Secondary | ICD-10-CM

## 2021-03-16 DIAGNOSIS — E1059 Type 1 diabetes mellitus with other circulatory complications: Secondary | ICD-10-CM

## 2021-03-16 DIAGNOSIS — K219 Gastro-esophageal reflux disease without esophagitis: Secondary | ICD-10-CM

## 2021-03-16 DIAGNOSIS — I25709 Atherosclerosis of coronary artery bypass graft(s), unspecified, with unspecified angina pectoris: Secondary | ICD-10-CM | POA: Diagnosis not present

## 2021-03-16 DIAGNOSIS — Z0000 Encounter for general adult medical examination without abnormal findings: Secondary | ICD-10-CM

## 2021-03-16 DIAGNOSIS — R0989 Other specified symptoms and signs involving the circulatory and respiratory systems: Secondary | ICD-10-CM

## 2021-03-16 DIAGNOSIS — G4709 Other insomnia: Secondary | ICD-10-CM

## 2021-03-16 DIAGNOSIS — Z13820 Encounter for screening for osteoporosis: Secondary | ICD-10-CM

## 2021-03-16 DIAGNOSIS — R252 Cramp and spasm: Secondary | ICD-10-CM

## 2021-03-16 DIAGNOSIS — F419 Anxiety disorder, unspecified: Secondary | ICD-10-CM

## 2021-03-16 LAB — COMPREHENSIVE METABOLIC PANEL
ALT: 13 U/L (ref 0–35)
AST: 17 U/L (ref 0–37)
Albumin: 4.3 g/dL (ref 3.5–5.2)
Alkaline Phosphatase: 84 U/L (ref 39–117)
BUN: 25 mg/dL — ABNORMAL HIGH (ref 6–23)
CO2: 35 mEq/L — ABNORMAL HIGH (ref 19–32)
Calcium: 9.7 mg/dL (ref 8.4–10.5)
Chloride: 101 mEq/L (ref 96–112)
Creatinine, Ser: 0.86 mg/dL (ref 0.40–1.20)
GFR: 62.9 mL/min (ref >60.00)
Glucose, Bld: 68 mg/dL — ABNORMAL LOW (ref 70–99)
Potassium: 4.1 mEq/L (ref 3.5–5.1)
Sodium: 141 mEq/L (ref 135–145)
Total Bilirubin: 0.3 mg/dL (ref 0.2–1.2)
Total Protein: 7.5 g/dL (ref 6.0–8.3)

## 2021-03-16 LAB — CBC WITH DIFFERENTIAL/PLATELET
Basophils Absolute: 0.1 10*3/uL (ref 0.0–0.1)
Basophils Relative: 0.9 % (ref 0.0–3.0)
Eosinophils Absolute: 0.2 10*3/uL (ref 0.0–0.7)
Eosinophils Relative: 2 % (ref 0.0–5.0)
HCT: 37.2 % (ref 36.0–46.0)
Hemoglobin: 12.2 g/dL (ref 12.0–15.0)
Lymphocytes Relative: 27.4 % (ref 12.0–46.0)
Lymphs Abs: 2.1 10*3/uL (ref 0.7–4.0)
MCHC: 32.7 g/dL (ref 30.0–36.0)
MCV: 85 fl (ref 78.0–100.0)
Monocytes Absolute: 0.5 10*3/uL (ref 0.1–1.0)
Monocytes Relative: 6.8 % (ref 3.0–12.0)
Neutro Abs: 4.9 10*3/uL (ref 1.4–7.7)
Neutrophils Relative %: 62.9 % (ref 43.0–77.0)
Platelets: 336 10*3/uL (ref 150.0–400.0)
RBC: 4.38 Mil/uL (ref 3.87–5.11)
RDW: 15.1 % (ref 11.5–15.5)
WBC: 7.7 10*3/uL (ref 4.0–10.5)

## 2021-03-16 LAB — MICROALBUMIN / CREATININE URINE RATIO
Creatinine,U: 137.3 mg/dL
Microalb Creat Ratio: 1.2 mg/g (ref 0.0–30.0)
Microalb, Ur: 1.6 mg/dL (ref 0.0–1.9)

## 2021-03-16 LAB — HEMOGLOBIN A1C: Hgb A1c MFr Bld: 9.5 % — ABNORMAL HIGH (ref 4.6–6.5)

## 2021-03-16 LAB — TSH: TSH: 1.13 u[IU]/mL (ref 0.35–5.50)

## 2021-03-16 MED ORDER — DIAZEPAM 5 MG PO TABS: 5.0000 mg | ORAL_TABLET | Freq: Every day | ORAL | 5 refills | Status: DC

## 2021-03-16 NOTE — Assessment & Plan Note (Signed)
Chronic Euvolemic Did have an episode of significant leg swelling and she increased her water pill and that seemed to help

## 2021-03-16 NOTE — Assessment & Plan Note (Signed)
Acute Experiencing dysuria Rule out UTI Urinalysis, urine culture

## 2021-03-16 NOTE — Patient Instructions (Addendum)
Dr Michel Bickers Address: [address], [place], [place] [address] Phone: [phone]   [facility] Phone: [phone]  [place]  [facility] [phone]   Blood work was ordered.     Medications changes include :   none  Your prescription(s) have been submitted to your pharmacy. Please take as directed and contact our office if you believe you are having problem(s) with the medication(s).   A referral was ordered for psychiatry.    Please followup in 6 months   Health Maintenance, Female Adopting a healthy lifestyle and getting preventive care are important in promoting health and wellness. Ask your health care provider about: The right schedule for you to have regular tests and exams. Things you can do on your own to prevent diseases and keep yourself healthy. What should I know about diet, weight, and exercise? Eat a healthy diet  Eat a diet that includes plenty of vegetables, fruits, low-fat dairy products, and lean protein. Do not eat a lot of foods that are high in solid fats, added sugars, or sodium. Maintain a healthy weight Body mass index (BMI) is used to identify weight problems. It estimates body fat based on height and weight. Your health care provider can help determine your BMI and help you achieve or maintain a healthy weight. Get regular exercise Get regular exercise. This is one of the most important things you can do for your health. Most adults should: Exercise for at least 150 minutes each week. The exercise should increase your heart rate and make you sweat (moderate-intensity exercise). Do strengthening exercises at least twice a week. This is in addition to the moderate-intensity exercise. Spend less time sitting. Even light physical activity can be beneficial. Watch cholesterol and blood lipids Have your blood tested for lipids and cholesterol at 83 years of age, then have this test every 5 years. Have your cholesterol levels checked more  often if: Your lipid or cholesterol levels are high. You are older than 83 years of age. You are at high risk for heart disease. What should I know about cancer screening? Depending on your health history and family history, you may need to have cancer screening at various ages. This may include screening for: Breast cancer. Cervical cancer. Colorectal cancer. Skin cancer. Lung cancer. What should I know about heart disease, diabetes, and high blood pressure? Blood pressure and heart disease High blood pressure causes heart disease and increases the risk of stroke. This is more likely to develop in people who have high blood pressure readings or are overweight. Have your blood pressure checked: Every 3-5 years if you are 83-83 years of age. Every year if you are 54 years old or older. Diabetes Have regular diabetes screenings. This checks your fasting blood sugar level. Have the screening done: Once every three years after age 83 if you are at a normal weight and have a low risk for diabetes. More often and at a younger age if you are overweight or have a high risk for diabetes. What should I know about preventing infection? Hepatitis B If you have a higher risk for hepatitis B, you should be screened for this virus. Talk with your health care provider to find out if you are at risk for hepatitis B infection. Hepatitis C Testing is recommended for: Everyone born from [birth year] through 1967. Anyone with known risk factors for hepatitis C. Sexually transmitted infections (STIs) Get screened for STIs, including gonorrhea and chlamydia, if: You are sexually active and  are younger than 83 years of age. You are older than 83 years of age and your health care provider tells you that you are at risk for this type of infection. Your sexual activity has changed since you were last screened, and you are at increased risk for chlamydia or gonorrhea. Ask your health care provider if you are at  risk. Ask your health care provider about whether you are at high risk for HIV. Your health care provider may recommend a prescription medicine to help prevent HIV infection. If you choose to take medicine to prevent HIV, you should first get tested for HIV. You should then be tested every 3 months for as long as you are taking the medicine. Pregnancy If you are about to stop having your period (premenopausal) and you may become pregnant, seek counseling before you get pregnant. Take 400 to 800 micrograms (mcg) of folic acid every day if you become pregnant. Ask for birth control (contraception) if you want to prevent pregnancy. Osteoporosis and menopause Osteoporosis is a disease in which the bones lose minerals and strength with aging. This can result in bone fractures. If you are 85 years old or older, or if you are at risk for osteoporosis and fractures, ask your health care provider if you should: Be screened for bone loss. Take a calcium or vitamin D supplement to lower your risk of fractures. Be given hormone replacement therapy (HRT) to treat symptoms of menopause. Follow these instructions at home: Alcohol use Do not drink alcohol if: Your health care provider tells you not to drink. You are pregnant, may be pregnant, or are planning to become pregnant. If you drink alcohol: Limit how much you have to: 0-1 drink a day. Know how much alcohol is in your drink. In the [place] one drink equals one 12 oz bottle of beer (355 mL), one 5 oz glass of wine (148 mL), or one 1 oz glass of hard liquor (44 mL). Lifestyle Do not use any products that contain nicotine or tobacco. These products include cigarettes, chewing tobacco, and vaping devices, such as e-cigarettes. If you need help quitting, ask your health care provider. Do not use street drugs. Do not share needles. Ask your health care provider for help if you need support or information about quitting drugs. General instructions Schedule  regular health, dental, and eye exams. Stay current with your vaccines. Tell your health care provider if: You often feel depressed. You have ever been abused or do not feel safe at home. Summary Adopting a healthy lifestyle and getting preventive care are important in promoting health and wellness. Follow your health care provider's instructions about healthy diet, exercising, and getting tested or screened for diseases. Follow your health care provider's instructions on monitoring your cholesterol and blood pressure. This information is not intended to replace advice given to you by your health care provider. Make sure you discuss any questions you have with your health care provider. Document Revised: 06/16/2020 Document Reviewed: 06/16/2020 Elsevier Patient Education  [place].

## 2021-03-16 NOTE — Assessment & Plan Note (Signed)
Chronic Still having night cramps Continue baclofen 40 mg twice daily

## 2021-03-16 NOTE — Assessment & Plan Note (Signed)
Chronic GERD controlled Continue famotidine 40 mg daily as needed, Aciphex 20 mg daily

## 2021-03-16 NOTE — Assessment & Plan Note (Signed)
Chronic Following with cardiology Having some chest pressure at that cardiology told her it was not cardiac in nature Continue current medications

## 2021-03-16 NOTE — Assessment & Plan Note (Signed)
Chronic Management per endocrine We will check A1c since she is getting blood work done

## 2021-03-16 NOTE — Assessment & Plan Note (Signed)
Chronic Not controlled recently due to her current medical problems Continue diazepam 5 mg twice daily Continue nortriptyline 100 mg nightly-discussed that it would be difficult to add an SSRI because she is already on nortriptyline Will refer to psychiatry for their input

## 2021-03-16 NOTE — Assessment & Plan Note (Signed)
Chronic Controlled, Stable Continue nortriptyline 100 mg nightly

## 2021-03-16 NOTE — Assessment & Plan Note (Signed)
Chronic Blood pressure well controlled CMP Continue diltiazem 300 mg daily Imdur 60 mg twice daily, lisinopril 20 mg daily, metoprolol XL 100 mg daily, spironolactone 25 mg daily

## 2021-03-16 NOTE — Assessment & Plan Note (Signed)
Chronic ?Continue monthly B12 injections ? ?

## 2021-03-19 LAB — URINE CULTURE

## 2021-03-19 MED ORDER — AMOXICILLIN-POT CLAVULANATE 875-125 MG PO TABS: 1.0000 | ORAL_TABLET | Freq: Two times a day (BID) | ORAL | 0 refills | Status: AC

## 2021-03-19 NOTE — Addendum Note (Signed)
Addended by: Binnie Rail on: 03/19/2021 04:03 PM   Modules accepted: Orders

## 2021-03-26 ENCOUNTER — Telehealth: Payer: Self-pay | Admitting: Internal Medicine

## 2021-03-26 NOTE — Telephone Encounter (Signed)
Vivian from [employer] has called and states Medical Clearance was sent over 1.23.2023. It has not been received by them.   I asked her to refax and MD would fax back ASAP.   Fax #- [phone]

## 2021-03-26 NOTE — Telephone Encounter (Signed)
Placed in blue folder to be signed.

## 2021-03-27 NOTE — Telephone Encounter (Signed)
Faxed today and conformation recieved

## 2021-03-31 NOTE — Progress Notes (Signed)
NEUROLOGY FOLLOW UP OFFICE NOTE  [name] [id number]  Assessment/Plan:   Dizziness Headache OSA  I believe that her dizziness and headaches are due to untreated obstructive sleep apnea.  I will refer her to a new sleep specialist.  She would like to remain on the nortriptyline for now as it helps her sleep.  Follow up 4 months.  Subjective:  [name] is a 81 year old woman with coronary artery disease, hypertension, type 2 diabetes mellitus, hyperlipidemia, B12 deficiency and obstructive sleep apnea and tension-type headache who follows up for dizziness and headache.   UPDATE: Last seen in November 2020.   History of vertigo off and on for several years.  Became more chronic beginning over the summer.  Seen in ED on 08/24/2020.  MRI of brain personally reviewed revealed empty sella but otherwise no acute intracranial abnormality.  Occurs while walking or standing.  Not associated with quick head movements or turning over in bed.  Went to vestibular rehab which was ineffective.   Still feels dizzy.  It comes and goes.  It is a lightheadedness and things look like that they are moving when they are not.  Lasts 20 minutes.  Not necessarily positional.  Usually will occur for a week and occurs every 2 weeks.   Intensity:  Moderate to severe Duration:  Wakes up at 5 AM with headache.  Takes Tylenol and resolves by 11 AM.  However, it returns in the evening. Frequency:  They are now daily again. Frequency of abortive medication: Tylenol every 4 hours daily Current NSAIDS:  ASA $Re'81mg'Zrg$  Current analgesics:  Tylenol Current triptans:  no Current ergotamine:  no Current anti-emetic:  Promethazine $RemoveBefor'25mg'jwugmCpLMOlj$  Current muscle relaxants:  Baclofen $RemoveB'40mg'AyKODJiu$  twice daily Current anti-anxiolytic:  Klonopin Current sleep aide:  trazodone Current Antihypertensive medications:  Toprol XL $RemoveB'50mg'hBLLvBim$ , Cardizem, Lasix, Imdur Current Antidepressant medications:  Nortriptyline 100 mg Current Anticonvulsant  medications:  no Current anti-CGRP:  no Current Vitamins/Herbal/Supplements:  B12 Current Antihistamines/Decongestants:  Allegra, Singulair Other therapy:  ice pack   Caffeine:  1 cup coffee daily, tea Alcohol:  no Smoker:  no Diet:  hydrates Exercise:  yes Depression:  no; Anxiety:  no Other pain:  no Sleep hygiene:  Poor.  Untreated OSA.   She was taken off of CPAP by her prior sleep specialist because of reported noncompliance.  However, [name] insists she wore the mask all night and headaches were improved while she used the machine.     HISTORY: Onset:  She has history of migraines as a young woman which resolved after having children.  Current headaches started in 2016. Location:  She has constant holocephalic headache.  She has severe headache radiating from the base of her neck on the right up Quality:  pounding Initial Intensity:  10/10 Aura:  no Prodrome:  no Postdrome:  no Associated symptoms:  None.  No nausea, vomiting, photophobia, phonophobia or visual disturbance.  She has not had any new worse headache of her life, waking up from sleep Initial Duration:  All day Initial Frequency:  Daily headache, severe  Headache every other day Initial Frequency of abortive medication: daily Triggers:  None Relieving factors:  None Activity:  aggravates   Past NSAIDS:  Ibuprofen, naproxen (caused increased heart rate) Past analgesics:  Excedrin, tramadol and other opioids (adverse reaction) Past abortive triptans:  no Past muscle relaxants:  Robaxin, Flexeril Past anti-emetic:  Promethazine (effective) Past antihypertensive medications:  losartan Past antidepressant medications:  no Past anticonvulsant medications:  Gabapentin 235m (drowsiness) Past vitamins/Herbal/Supplements:  CoQ10 Past antihistamines/decongestants:  no Other past therapy:  Physical therapy of neck.   Family history of headache:  father   MRI of brain without contrast from 09/16/14 was  personally reviewed and revealed partially empty sella but otherwise unremarkable.   MRI of cervical spine from 09/16/14 was personally reviewed and demonstrated multilevel degenerative changes including left greater than right spurring at C3-C4 causing left foraminal narrowing, severe spurring at C4-C5 causing right moderate foraminal narrowing, and disc bulg and spurring at C6-C7 with moderate bilateral foraminal narrowing.  She was evaluated by Dr. RNelva Bushfor interventional pain (epidural or facet joint injections).  He didn't think it was a good idea given that she would have to stop Plavix.  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 For shortness of breath. 1 Inhaler 0   albuterol (PROVENTIL) (2.5 MG/3ML) 0.083% nebulizer solution USE 1 VIAL IN NEBULIZER 4 TIMES DAILY (Patient taking differently: Take 2.5 mg by nebulization 4 (four) times daily.) 360 mL 1   alum & mag hydroxide-simeth (MAALOX/MYLANTA)  200-200-20 MG/5ML suspension Take 30 mLs as needed by mouth for indigestion or heartburn.     aspirin EC 81 MG tablet Take 1 tablet (81 mg total) by mouth daily.     baclofen (LIORESAL) 20 MG tablet TAKE 2 TABLETS BY MOUTH 2 TIMES DAILY. 360 tablet 2   Bempedoic Acid-Ezetimibe (NEXLIZET) 180-10 MG TABS Take 1 tablet by mouth daily. 90 tablet 3   Blood Glucose Monitoring Suppl (ONE TOUCH ULTRA MINI) w/Device KIT 3 (three) times daily. for testing  0   calcium carbonate (OS-CAL) 1250 (500 Ca) MG chewable tablet Chew 1 tablet by mouth as needed for heartburn.     calcium carbonate (TUMS - DOSED IN MG ELEMENTAL CALCIUM) 500 MG chewable tablet Chew 2 tablets daily as needed by mouth for indigestion or heartburn.     Cholecalciferol (VITAMIN D) 2000 UNITS tablet Take 2,000 Units by mouth daily.     clopidogrel (PLAVIX) 75 MG tablet Take 75 mg by mouth daily.     Continuous Blood Gluc Sensor (FREESTYLE LIBRE 14 DAY SENSOR) MISC See admin instructions.     Cyanocobalamin (VITAMIN B-12 IJ) Inject 1,000 mcg as directed every 30 (thirty)  days.     cyclobenzaprine (FLEXERIL) 5 MG tablet Take 5 mg by mouth as needed for muscle spasms.     diazepam (VALIUM) 5 MG tablet Take 1 tablet (5 mg total) by mouth daily. 30 tablet 5   diclofenac sodium (VOLTAREN) 1 % GEL Apply 2 g topically 4 (four) times daily as needed (muscle pain).     diltiazem (CARDIZEM CD) 300 MG 24 hr capsule TAKE 1 CAPSULE (300 MG TOTAL) BY MOUTH DAILY. MUST KEEP SCHEDULED APPT FOR FUTURE REFILLS 90 capsule 1   famotidine (PEPCID) 40 MG tablet Take 1 tablet (40 mg total) by mouth daily as needed. (Patient taking differently: Take 40 mg by mouth daily as needed for heartburn or indigestion.) 90 tablet 1   fexofenadine (ALLEGRA) 180 MG tablet TAKE 1 TABLET BY MOUTH EVERY DAY (Patient taking differently: Take 180 mg by mouth as needed for allergies.) 90 tablet 3   Fluticasone-Salmeterol (ADVAIR DISKUS) 250-50 MCG/DOSE AEPB Inhale 1 puff into the  lungs 2 (two) times daily. (Patient taking differently: Inhale 1 puff into the lungs as needed (shortness of breath).) 60 each 5   furosemide (LASIX) 40 MG tablet Take 1 tablet (40 mg total) by mouth daily. 90 tablet 3   HYDROcodone-acetaminophen (NORCO/VICODIN) 5-325 MG tablet Take 1 tablet by mouth every 6 (six) hours as needed. 20 tablet 0   insulin lispro protamine-insulin lispro (HUMALOG 75/25) (75-25) 100 UNIT/ML SUSP Inject 15-30 Units into the skin See admin instructions. Inject 15 units in the morning, 5 units at lunch, and 30 units at dinner.     isosorbide mononitrate (IMDUR) 60 MG 24 hr tablet Take one tablet ( 60 mg ) twice daily 8 hours apart. (Patient taking differently: Take 60 mg by mouth 2 (two) times daily. Take 8 hours apart.) 180 tablet 3   lisinopril (ZESTRIL) 20 MG tablet Take 20 mg by mouth daily.     loperamide (IMODIUM) 2 MG capsule Take 2 mg by mouth as needed for diarrhea or loose stools.      meclizine (ANTIVERT) 25 MG tablet TAKE ONE TABLET BY MOUTH TWICE DAILY AS NEEDED FOR DIZZINESS (Patient taking differently: Take 25 mg by mouth as needed for dizziness.) 30 tablet 0   metoprolol succinate (TOPROL-XL) 100 MG 24 hr tablet TAKE 1 TABLET BY MOUTH EVERY DAY 90 tablet 3   Multiple Vitamin (MULTIVITAMIN WITH MINERALS) TABS Take 1 tablet by mouth daily.     Nebulizers (COMPRESSOR/NEBULIZER) MISC Use with albuterol 1 each 0   nitroGLYCERIN (NITROSTAT) 0.4 MG SL tablet Place 0.4 mg under the tongue every 5 (five) minutes as needed for chest pain.     nortriptyline (PAMELOR) 50 MG capsule TAKE 2 CAPSULES (100 MG TOTAL) BY MOUTH AT BEDTIME. 180 capsule 1   ONE TOUCH ULTRA TEST test strip 1 each by Other route 2 (two) times daily.  3   ONETOUCH DELICA LANCETS 97J MISC 3 (three) times daily. for testing  0   RABEprazole (ACIPHEX) 20 MG tablet Take 20 mg by mouth daily.     Spacer/Aero-Holding Chambers (AEROCHAMBER MV) inhaler Use as instructed 1 each 0   spironolactone  (ALDACTONE) 25 MG tablet Take 1 tablet (25 mg total) by mouth daily. 90 tablet 3   Study - ORION 4 - inclisiran 300 mg/1.80m or placebo SQ injection (PI-Stuckey) Inject 300 mg into the skin every 6 (six) months.     Tiotropium Bromide Monohydrate (SPIRIVA RESPIMAT) 2.5 MCG/ACT AERS Inhale 2 puffs into the lungs daily.  4 g 6   Current Facility-Administered Medications on File Prior to Visit  Medication Dose Route Frequency Provider Last Rate Last Admin   cyanocobalamin ((VITAMIN B-12)) injection 1,000 mcg  1,000 mcg Intramuscular Q30 days Binnie Rail, MD   1,000 mcg at 03/26/16 1658    ALLERGIES: Allergies  Allergen Reactions   Amlodipine Other (See Comments)    hallucinations    Banana Nausea And Vomiting    Stomach pumped   Co Q10 [Coenzyme Q10] Other (See Comments)    Body cramps   Codeine Other (See Comments)    Hallucinate, loose identity and don't know who I am   Insulin Aspart     Other reaction(s): Unknown   Lisinopril     Other reaction(s): nose bleed   Metformin And Related Nausea And Vomiting   Morphine Other (See Comments)    Can not function, it immobilizes me    Pentazocine Nausea And Vomiting   Pravastatin Other (See Comments)    Hands locked up   Repatha [Evolocumab] Other (See Comments)    myalgias   Statins Other (See Comments)    Muscle cramps   Sulfa Antibiotics Swelling   Sulfonamide Derivatives Swelling   Tramadol Hcl Other (See Comments)    FAMILY HISTORY: Family History  Problem Relation Age of Onset   Heart disease Maternal Grandfather    Heart failure Maternal Grandfather    Diabetes Maternal Grandfather    Heart attack Father    Diabetes Mother    Rheum arthritis Sister    Emphysema Paternal Uncle    Esophageal cancer Brother 66       she said he was born with it   Emphysema Paternal Aunt    Healthy Child    Neuropathy Neg Hx    Multiple sclerosis Neg Hx    Colon cancer Neg Hx    Colon polyps Neg Hx    Rectal cancer Neg Hx     Stomach cancer Neg Hx       Objective:  Blood pressure 115/73, pulse 81, height _0  (1.575 m), weight 182 lb 3.2 oz (82.6 kg), SpO2 98 %. General: No acute distress.  Patient appears well-groomed.   Head:  Normocephalic/atraumatic Eyes:  Fundi examined but not visualized Neck: supple, no paraspinal tenderness, full range of motion Heart:  Regular rate and rhythm Lungs:  Clear to auscultation bilaterally Back: No paraspinal tenderness Neurological Exam: alert and oriented to person, place, and time.  Speech fluent and not dysarthric, language intact.  CN II-XII intact. Bulk and tone normal, muscle strength 5/5 throughout.  Sensation to light touch intact.  Deep tendon reflexes 2+ throughout, toes downgoing.  Finger to nose testing intact.  Gait cautious but steady.  Difficulty with tandem walking. Romberg with mild sway.   Metta Clines, DO  CC: Billey Gosling, MD

## 2021-04-02 ENCOUNTER — Encounter: Payer: Self-pay | Admitting: Neurology

## 2021-04-02 ENCOUNTER — Ambulatory Visit (INDEPENDENT_AMBULATORY_CARE_PROVIDER_SITE_OTHER): Payer: Medicare Other | Admitting: Neurology

## 2021-04-02 ENCOUNTER — Other Ambulatory Visit: Payer: Self-pay

## 2021-04-02 VITALS — BP 115/73 | HR 81 | Ht 62.0 in | Wt 182.2 lb

## 2021-04-02 DIAGNOSIS — R42 Dizziness and giddiness: Secondary | ICD-10-CM

## 2021-04-02 DIAGNOSIS — G4733 Obstructive sleep apnea (adult) (pediatric): Secondary | ICD-10-CM | POA: Diagnosis not present

## 2021-04-02 DIAGNOSIS — R519 Headache, unspecified: Secondary | ICD-10-CM | POA: Diagnosis not present

## 2021-04-02 NOTE — Patient Instructions (Signed)
At this time, I think that the headaches and dizziness may be related to uncontrolled sleep apnea.  I will send you to the sleep specialists at [facility] for further evaluation and treatment Follow up 4 months.

## 2021-05-01 ENCOUNTER — Ambulatory Visit (INDEPENDENT_AMBULATORY_CARE_PROVIDER_SITE_OTHER): Payer: Medicare Other

## 2021-05-01 ENCOUNTER — Other Ambulatory Visit: Payer: Self-pay

## 2021-05-01 ENCOUNTER — Ambulatory Visit (INDEPENDENT_AMBULATORY_CARE_PROVIDER_SITE_OTHER): Payer: Medicare Other | Admitting: Adult Health

## 2021-05-01 ENCOUNTER — Encounter: Payer: Self-pay | Admitting: Adult Health

## 2021-05-01 VITALS — BP 100/70 | HR 83 | Temp 98.0°F | Ht 62.0 in | Wt 184.0 lb

## 2021-05-01 DIAGNOSIS — G4733 Obstructive sleep apnea (adult) (pediatric): Secondary | ICD-10-CM | POA: Diagnosis not present

## 2021-05-01 DIAGNOSIS — J449 Chronic obstructive pulmonary disease, unspecified: Secondary | ICD-10-CM

## 2021-05-01 MED ORDER — TRELEGY ELLIPTA 200-62.5-25 MCG/ACT IN AEPB: 1.0000 | INHALATION_SPRAY | Freq: Every day | RESPIRATORY_TRACT | 0 refills | Status: DC

## 2021-05-01 MED ORDER — TRELEGY ELLIPTA 200-62.5-25 MCG/ACT IN AEPB: 1.0000 | INHALATION_SPRAY | Freq: Every day | RESPIRATORY_TRACT | 6 refills | Status: DC

## 2021-05-01 NOTE — Assessment & Plan Note (Signed)
COPD with asthma patient has increased symptom burden off of Trelegy.  We will restart Trelegy.  Sample was given today.  And prescription was sent to her pharmacy.  Chest x-ray today.  Immunizations are up-to-date. ? ?Plan  ?Patient Instructions  ?Restart Trelegy 1 puff daily , rinse after use.  ?Albuterol inhaler or neb As needed   ?Activity as tolerated.  ?Chest xray today  ?Call back if you change mind on sleep study.  ?Follow up with Dr. Vaughan Browner in 4 months and As needed   ? ? ?  ? ?

## 2021-05-01 NOTE — Progress Notes (Signed)
? ?'@Patient'  ID: [name], female    DOB: [birth year], 81 y.o.   MRN: [id number] ? ?Chief Complaint  ?Patient presents with  ? Consult  ? ? ?Referring provider: ?Binnie Rail, MD ? ?HPI: ?81 year old female former smoker followed for COPD with asthma overlap syndrome, obstructive sleep apnea. Pulmonary Hypertension .  ?She is followed by Dr. Vaughan Browner (former patient of Dr. Lake Bells and Dr. Ashok Cordia) ?She is a Theme park manager  ?Medical history significant for IDDM  (Type 1)  ? ?Occupation: Retired Education officer, museum from [employer] ?Exposures: No known exposures.  No mold, hot tub, Jacuzzi, no down pillows or comforters. ?Smoking history: 10-pack-year smoker.  Quit around 1990 ?Travel history: No significant travel history ?Relevant family history: No significant family history of lung disease ? ?TEST/EVENTS :  ?CT chest 12/20/2017- right lower lobe nodular opacity has resolved compatible with atelectasis.  Apical pulmonary nodule decreased in size.  Emphysema. ?Chest x-ray 08/01/2018- prior CABG.  No acute pulmonary disease. ?Chest x-ray 03/08/2019- Mild interstitial markings with atelectasis. ? ?  ?PFTs: ?06/04/15: FVC 1.57 L (77%) FEV1 1.01 L (64%) FEV1/FVC 0.65 FEF 25-75 0.54 L (39%) negative bronchodilator response ?02/21/15: FVC 1.60 L (78%) FEV1 1.03 L (65%) FEV1/FVC 0.64 FEF 25-75 0.51 L (37%) positive bronchodilator response ?11/20/14: FVC 1.44 L (70%) FEV1 0.91 L (57%) FEV1/FVC 0.63 FEF 25-75 0.47 L (33%) positive bronchodilator response TLC 3.92 L (79%) RV 103% DLCO uncorrected 64% ?11/12/13: FVC 1.57 L (74%) FEV1 1.09 L (67%) FEV1/FVC 0.70 FEF 25-75 0.72 L (50%) negative bronchodilator response ?08/04/11: FVC 1.61 L (60%) FEV1 0.89 L (48%) FEV1/FVC 0.56 FEF 25-75 0.29 L (14%) positive bronchodilator response TLC 3.67 L (80%) RV 103% ERV 46% DLCO corrected 38% ?  ?Labs: ?CBC 10/20/2017-WBC 6, eos 2.6%, absolute eosinophil count 156 ?CBC 02/12/2019-WBC 7.1, eos 1.8%, absolute eosinophil count 128 ?  ?Sleep ?Home sleep  study 03/29/2017- AHI 1.8, desats to 78% ?PSG 10/07/2016- AHI 6.8.  Total sleep time 40.5 minutes. ?  ?In lab sleep study 11/20/2018-mild sleep apnea AHI 13.3, low O2 sat of 81%. ? ?2D echo January 2019 EF 65 to 01%, grade 2 diastolic dysfunction, mild mitral valve stenosis pulmonary hypertension with pulmonary artery pressure 44 mmHg ? ?05/01/2021 Follow up : COPD w/ asthma, OSA  ?Patient returns for a follow-up visit.  Patient was last seen April 2021.  She has underlying COPD with asthma . She was also started on Trelegy. Says she ran out of [employer] 1 month ago. Says she runs out of breath with prolonged talking , especially with sermons.  ?Has been using albuterol daily since off TRELEGY .  ?Was doing better while taking TRELEGY much worse since she ran out.  ?Denies chest pain , orthopnea, edema.  ? ? Last visit patient was recommended to restart CPAP.  Says she started on CPAP but insurance/DME took machine back due to non compliance. She says she was wearing it but it was not enough for them. She has had 3 sleep studies in past . Says she does not have any significant daytime sleepiness. , does not nap. Epworth is 0.  ?Previous sleep studies showed mild OSA.  ?Says she only sleeps about 4 hr each night , sometimes will go back to sleep  ? ? ? ? ?Allergies  ?Allergen Reactions  ? Amlodipine Other (See Comments)  ?  hallucinations   ? Banana Nausea And Vomiting  ?  Stomach pumped  ? Co Q10 [Coenzyme Q10] Other (See Comments)  ?  Body cramps  ? Codeine Other (See Comments)  ?  Hallucinate, loose identity and don't know who I am  ? Insulin Aspart   ?  Other reaction(s): Unknown  ? Lisinopril   ?  Other reaction(s): nose bleed  ? Metformin And Related Nausea And Vomiting  ? Morphine Other (See Comments)  ?  Can not function, it immobilizes me   ? Pentazocine Nausea And Vomiting  ? Pravastatin Other (See Comments)  ?  Hands locked up  ? Repatha [Evolocumab] Other (See Comments)  ?  myalgias  ? Statins Other (See  Comments)  ?  Muscle cramps  ? Sulfa Antibiotics Swelling  ? Sulfonamide Derivatives Swelling  ? Tramadol Hcl Other (See Comments)  ? ? ?Immunization History  ?Administered Date(s) Administered  ? Fluad Quad(high Dose 65+) 09/29/2018  ? Influenza Split 12/15/2011, 11/08/2012, 11/19/2014  ? Influenza Whole 10/09/2008  ? Influenza, High Dose Seasonal PF 11/19/2015, 11/23/2016, 10/26/2017, 10/21/2020  ? Influenza-Unspecified 10/29/2013  ? PFIZER(Purple Top)SARS-COV-2 Vaccination 04/05/2019, 05/01/2019  ? Pension scheme manager 66yr & up 10/21/2020  ? Pneumococcal Conjugate-13 09/03/2015  ? Pneumococcal Polysaccharide-23 02/09/2004  ? Pneumococcal-Unspecified 02/09/2012  ? ? ?Past Medical History:  ?Diagnosis Date  ? Adenomatous colon polyp   ? Allergy   ? Anxiety   ? Asthma   ? Carotid artery disease ([place]   ? carotid [place]: bilat ICA 1-39%  ? Chronic diastolic CHF   ? Echo 02/2017: EF 65-70, Gr 2 DD, mild MS (mean 5), PASP 44  ? COPD   ? pt is unsure if has been officially diagnosed  ? Coronary artery disease   ? CABG '09- cathed 12/09, 9/10, 6/11, 3/14 and 12/13/16- medical Rx // cath 12/2016 - 2/3 grafts patent >> med Rx // Myoview 12/17: low risk  ? Diabetes mellitus   ? Gastroesophageal reflux disease   ? Hiatal hernia   ? 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 elevation MI 2009  ? s/p CABG  ? Hyperlipidemia   ? Hypertension   ? PONV (postoperative nausea and vomiting)   ? Schatzki's ring   ? Shoulder injury   ? resolved after shoulder surgery  ? Sleep apnea   ? not on cpap  ? ? ?Tobacco History: ?Social History  ? ?Tobacco Use  ?Smoking Status Former  ? Packs/day: 0.50  ? Years: 21.00  ? Pack years: 10.50  ? Types: Cigarettes  ? Start date: 02/09/1955  ? Quit date: 02/09/1976  ? Years since quitting: 45.2  ?Smokeless Tobacco Never  ? ?Counseling given: Not Answered ? ? ?Outpatient Medications Prior to Visit  ?Medication Sig Dispense Refill  ? albuterol (PROVENTIL HFA;VENTOLIN HFA) 108 (90 BASE) MCG/ACT inhaler Inhale 2  puffs into the lungs every 6 (six) hours as needed. For shortness of breath. 1 Inhaler 0  ? albuterol (PROVENTIL) (2.5 MG/3ML) 0.083% nebulizer solution USE 1 VIAL IN NEBULIZER 4 TIMES DAILY (Patient taking differently: Take 2.5 mg by nebulization 4 (four) times daily.) 360 mL 1  ? alum & mag hydroxide-simeth (MAALOX/MYLANTA) 200-200-20 MG/5ML suspension Take 30 mLs as needed by mouth for indigestion or heartburn.    ? aspirin EC 81 MG tablet Take 1 tablet (81 mg total) by mouth daily.    ? baclofen (LIORESAL) 20 MG tablet TAKE 2 TABLETS BY MOUTH 2 TIMES DAILY. 360 tablet 2  ? Bempedoic Acid-Ezetimibe (NEXLIZET) 180-10 MG TABS Take 1 tablet by mouth daily. 90 tablet 3  ? Blood Glucose Monitoring Suppl (ONE TOUCH ULTRA MINI) w/Device  KIT 3 (three) times daily. for testing  0  ? calcium carbonate (OS-CAL) 1250 (500 Ca) MG chewable tablet Chew 1 tablet by mouth as needed for heartburn.    ? calcium carbonate (TUMS - DOSED IN MG ELEMENTAL CALCIUM) 500 MG chewable tablet Chew 2 tablets daily as needed by mouth for indigestion or heartburn.    ? Cholecalciferol (VITAMIN D) 2000 UNITS tablet Take 2,000 Units by mouth daily.    ? clopidogrel (PLAVIX) 75 MG tablet Take 75 mg by mouth daily.    ? Continuous Blood Gluc Sensor (FREESTYLE LIBRE 14 DAY SENSOR) MISC See admin instructions.    ? Cyanocobalamin (VITAMIN B-12 IJ) Inject 1,000 mcg as directed every 30 (thirty) days.    ? cyclobenzaprine (FLEXERIL) 5 MG tablet Take 5 mg by mouth as needed for muscle spasms.    ? diazepam (VALIUM) 5 MG tablet Take 1 tablet (5 mg total) by mouth daily. 30 tablet 5  ? diltiazem (CARDIZEM CD) 300 MG 24 hr capsule TAKE 1 CAPSULE (300 MG TOTAL) BY MOUTH DAILY. MUST KEEP SCHEDULED APPT FOR FUTURE REFILLS 90 capsule 1  ? famotidine (PEPCID) 40 MG tablet Take 1 tablet (40 mg total) by mouth daily as needed. (Patient taking differently: Take 40 mg by mouth daily as needed for heartburn or indigestion.) 90 tablet 1  ? fexofenadine (ALLEGRA) 180  MG tablet TAKE 1 TABLET BY MOUTH EVERY DAY (Patient taking differently: Take 180 mg by mouth as needed for allergies.) 90 tablet 3  ? Fluticasone-Salmeterol (ADVAIR DISKUS) 250-50 MCG/DOSE AEPB Inhale 1 pu

## 2021-05-01 NOTE — Patient Instructions (Addendum)
Restart Trelegy 1 puff daily , rinse after use.  ?Albuterol inhaler or neb As needed   ?Activity as tolerated.  ?Chest xray today  ?Call back if you change mind on sleep study.  ?Follow up with Dr. Vaughan Browner in 4 months and As needed   ? ? ?

## 2021-05-01 NOTE — Assessment & Plan Note (Signed)
Previous sleep studies have showed either no sleep apnea or mild sleep apnea with some nocturnal hypoxemia.  Long discussion regarding her daily symptoms.  Patient has no significant daytime sleepiness.  We discussed if we want to restart CPAP that we would need to repeat a sleep study as she has had a break in therapy. ?At this time she does not wish to proceed with a sleep study.  Discussed healthy sleep regimen. ? ?Plan  ?Patient Instructions  ?Restart Trelegy 1 puff daily , rinse after use.  ?Albuterol inhaler or neb As needed   ?Activity as tolerated.  ?Chest xray today  ?Call back if you change mind on sleep study.  ?Follow up with Dr. Vaughan Browner in 4 months and As needed   ? ? ?  ? ?

## 2021-07-07 DIAGNOSIS — Z006 Encounter for examination for normal comparison and control in clinical research program: Secondary | ICD-10-CM

## 2021-07-07 NOTE — Research (Signed)
Patient was seen in the research clinic today for month 39 visit of the ORION 4 trial. All medications reviewed with patient and no changes noted at this time. She denies any adverse events or hospitalizations since her last research visit. Lab work completed per protocol and patient tolerated without complaints. IP injection was given at 1210 in the RLQ of the abdomen. Next appointment made for Month 45 visit for Nov 14th @ 1pm.   Current Outpatient Medications:    albuterol (PROVENTIL HFA;VENTOLIN HFA) 108 (90 BASE) MCG/ACT inhaler, Inhale 2 puffs into the lungs every 6 (six) hours as needed. For shortness of breath., Disp: 1 Inhaler, Rfl: 0   albuterol (PROVENTIL) (2.5 MG/3ML) 0.083% nebulizer solution, USE 1 VIAL IN NEBULIZER 4 TIMES DAILY (Patient taking differently: Take 2.5 mg by nebulization 4 (four) times daily.), Disp: 360 mL, Rfl: 1   alum & mag hydroxide-simeth (MAALOX/MYLANTA) 200-200-20 MG/5ML suspension, Take 30 mLs as needed by mouth for indigestion or heartburn., Disp: , Rfl:    aspirin EC 81 MG tablet, Take 1 tablet (81 mg total) by mouth daily., Disp: , Rfl:    baclofen (LIORESAL) 20 MG tablet, TAKE 2 TABLETS BY MOUTH 2 TIMES DAILY., Disp: 360 tablet, Rfl: 2   Bempedoic Acid-Ezetimibe (NEXLIZET) 180-10 MG TABS, Take 1 tablet by mouth daily., Disp: 90 tablet, Rfl: 3   Blood Glucose Monitoring Suppl (ONE TOUCH ULTRA MINI) w/Device KIT, 3 (three) times daily. for testing, Disp: , Rfl: 0   calcium carbonate (OS-CAL) 1250 (500 Ca) MG chewable tablet, Chew 1 tablet by mouth as needed for heartburn., Disp: , Rfl:    calcium carbonate (TUMS - DOSED IN MG ELEMENTAL CALCIUM) 500 MG chewable tablet, Chew 2 tablets daily as needed by mouth for indigestion or heartburn., Disp: , Rfl:    Cholecalciferol (VITAMIN D) 2000 UNITS tablet, Take 2,000 Units by mouth daily., Disp: , Rfl:    clopidogrel (PLAVIX) 75 MG tablet, Take 75 mg by mouth daily., Disp: , Rfl:    Continuous Blood Gluc Sensor  (FREESTYLE LIBRE 14 DAY SENSOR) MISC, See admin instructions., Disp: , Rfl:    Cyanocobalamin (VITAMIN B-12 IJ), Inject 1,000 mcg as directed every 30 (thirty) days., Disp: , Rfl:    cyclobenzaprine (FLEXERIL) 5 MG tablet, Take 5 mg by mouth as needed for muscle spasms., Disp: , Rfl:    diazepam (VALIUM) 5 MG tablet, Take 1 tablet (5 mg total) by mouth daily., Disp: 30 tablet, Rfl: 5   diltiazem (CARDIZEM CD) 300 MG 24 hr capsule, TAKE 1 CAPSULE (300 MG TOTAL) BY MOUTH DAILY. MUST KEEP SCHEDULED APPT FOR FUTURE REFILLS, Disp: 90 capsule, Rfl: 1   famotidine (PEPCID) 40 MG tablet, Take 1 tablet (40 mg total) by mouth daily as needed. (Patient taking differently: Take 40 mg by mouth daily as needed for heartburn or indigestion.), Disp: 90 tablet, Rfl: 1   fexofenadine (ALLEGRA) 180 MG tablet, TAKE 1 TABLET BY MOUTH EVERY DAY (Patient taking differently: Take 180 mg by mouth as needed for allergies.), Disp: 90 tablet, Rfl: 3   Fluticasone-Salmeterol (ADVAIR DISKUS) 250-50 MCG/DOSE AEPB, Inhale 1 puff into the lungs 2 (two) times daily. (Patient taking differently: Inhale 1 puff into the lungs as needed (shortness of breath).), Disp: 60 each, Rfl: 5   Fluticasone-Umeclidin-Vilant (TRELEGY ELLIPTA) 200-62.5-25 MCG/ACT AEPB, Inhale 1 puff into the lungs daily at 6 (six) AM., Disp: 60 each, Rfl: 6   Fluticasone-Umeclidin-Vilant (TRELEGY ELLIPTA) 200-62.5-25 MCG/ACT AEPB, Inhale 1 puff into the lungs daily  at 6 (six) AM., Disp: 1 each, Rfl: 0   furosemide (LASIX) 40 MG tablet, Take 1 tablet (40 mg total) by mouth daily., Disp: 90 tablet, Rfl: 3   HYDROcodone-acetaminophen (NORCO/VICODIN) 5-325 MG tablet, Take 1 tablet by mouth every 6 (six) hours as needed., Disp: 20 tablet, Rfl: 0   insulin lispro protamine-insulin lispro (HUMALOG 75/25) (75-25) 100 UNIT/ML SUSP, Inject 15-30 Units into the skin See admin instructions. Inject 15 units in the morning, 5 units at lunch, and 30 units at dinner., Disp: , Rfl:     isosorbide mononitrate (IMDUR) 60 MG 24 hr tablet, Take one tablet ( 60 mg ) twice daily 8 hours apart. (Patient taking differently: Take 60 mg by mouth 2 (two) times daily. Take 8 hours apart.), Disp: 180 tablet, Rfl: 3   loperamide (IMODIUM) 2 MG capsule, Take 2 mg by mouth as needed for diarrhea or loose stools.  (Patient not taking: Reported on 05/01/2021), Disp: , Rfl:    meclizine (ANTIVERT) 25 MG tablet, TAKE ONE TABLET BY MOUTH TWICE DAILY AS NEEDED FOR DIZZINESS (Patient not taking: Reported on 05/01/2021), Disp: 30 tablet, Rfl: 0   metoprolol succinate (TOPROL-XL) 100 MG 24 hr tablet, TAKE 1 TABLET BY MOUTH EVERY DAY, Disp: 90 tablet, Rfl: 3   Multiple Vitamin (MULTIVITAMIN WITH MINERALS) TABS, Take 1 tablet by mouth daily., Disp: , Rfl:    Nebulizers (COMPRESSOR/NEBULIZER) MISC, Use with albuterol, Disp: 1 each, Rfl: 0   nitroGLYCERIN (NITROSTAT) 0.4 MG SL tablet, Place 0.4 mg under the tongue every 5 (five) minutes as needed for chest pain. (Patient not taking: Reported on 05/01/2021), Disp: , Rfl:    nortriptyline (PAMELOR) 50 MG capsule, TAKE 2 CAPSULES (100 MG TOTAL) BY MOUTH AT BEDTIME., Disp: 180 capsule, Rfl: 1   ONE TOUCH ULTRA TEST test strip, 1 each by Other route 2 (two) times daily., Disp: , Rfl: 3   ONETOUCH DELICA LANCETS 96V MISC, 3 (three) times daily. for testing, Disp: , Rfl: 0   Spacer/Aero-Holding Chambers (AEROCHAMBER MV) inhaler, Use as instructed, Disp: 1 each, Rfl: 0   spironolactone (ALDACTONE) 25 MG tablet, Take 1 tablet (25 mg total) by mouth daily., Disp: 90 tablet, Rfl: 3   Study - ORION 4 - inclisiran 300 mg/1.108m or placebo SQ injection (PI-Stuckey), Inject 300 mg into the skin every 6 (six) months., Disp: , Rfl:    Tiotropium Bromide Monohydrate (SPIRIVA RESPIMAT) 2.5 MCG/ACT AERS, Inhale 2 puffs into the lungs daily., Disp: 4 g, Rfl: 6  Current Facility-Administered Medications:    cyanocobalamin ((VITAMIN B-12)) injection 1,000 mcg, 1,000 mcg, Intramuscular,  Q30 days, BBinnie Rail MD, 1,000 mcg at 03/26/16 1658

## 2021-07-10 ENCOUNTER — Encounter: Payer: Self-pay | Admitting: Internal Medicine

## 2021-07-10 NOTE — Progress Notes (Signed)
Subjective:    Patient ID: [name], female    DOB: 20-May-1940, 81 y.o.   MRN: [id number]     HPI [name] is here for follow up of her chronic medical problems, including diabetes.  Her sugars have been elevated.  Has been seeing Dr Chalmers Cater, but she declined seeing her anymore because of a missed appt in July 2022.       Seeing Dr Veverly Fells for her left shoulder pain.  She had an injection and it did not help. She will need a shoulder replacement.   She will get the surgery in August, but if she gets another injection which she may need to do because of the degree of pain she has been she will not be able to have surgery in August-Will need to wait 3 months after that.    Sugars 250-340  - doing humalog 75/25 as noted in the chart.  She has not tolerated pills well in the past - made her very sick.  She is compliant with a diabetic diet.  She did have a recent steroid injection that caused her sugars to go higher.  She was still high prior to that.     She needs a referral to a different endocrinologist.   Medications and allergies reviewed with patient and updated if appropriate.  Current Outpatient Medications on File Prior to Visit  Medication Sig Dispense Refill   albuterol (PROVENTIL HFA;VENTOLIN HFA) 108 (90 BASE) MCG/ACT inhaler Inhale 2 puffs into the lungs every 6 (six) hours as needed. For shortness of breath. 1 Inhaler 0   albuterol (PROVENTIL) (2.5 MG/3ML) 0.083% nebulizer solution USE 1 VIAL IN NEBULIZER 4 TIMES DAILY (Patient taking differently: Take 2.5 mg by nebulization 4 (four) times daily.) 360 mL 1   alum & mag hydroxide-simeth (MAALOX/MYLANTA) 200-200-20 MG/5ML suspension Take 30 mLs as needed by mouth for indigestion or heartburn.     aspirin EC 81 MG tablet Take 1 tablet (81 mg total) by mouth daily.     baclofen (LIORESAL) 20 MG tablet TAKE 2 TABLETS BY MOUTH 2 TIMES DAILY. 360 tablet 2   Bempedoic Acid-Ezetimibe (NEXLIZET) 180-10 MG TABS Take 1 tablet by  mouth daily. 90 tablet 3   Blood Glucose Monitoring Suppl (ONE TOUCH ULTRA MINI) w/Device KIT 3 (three) times daily. for testing  0   calcium carbonate (OS-CAL) 1250 (500 Ca) MG chewable tablet Chew 1 tablet by mouth as needed for heartburn.     calcium carbonate (TUMS - DOSED IN MG ELEMENTAL CALCIUM) 500 MG chewable tablet Chew 2 tablets daily as needed by mouth for indigestion or heartburn.     Cholecalciferol (VITAMIN D) 2000 UNITS tablet Take 2,000 Units by mouth daily.     clopidogrel (PLAVIX) 75 MG tablet Take 75 mg by mouth daily.     Continuous Blood Gluc Sensor (FREESTYLE LIBRE 14 DAY SENSOR) MISC See admin instructions.     Cyanocobalamin (VITAMIN B-12 IJ) Inject 1,000 mcg as directed every 30 (thirty) days.     cyclobenzaprine (FLEXERIL) 5 MG tablet Take 5 mg by mouth as needed for muscle spasms.     diazepam (VALIUM) 5 MG tablet Take 1 tablet (5 mg total) by mouth daily. 30 tablet 5   diltiazem (CARDIZEM CD) 300 MG 24 hr capsule TAKE 1 CAPSULE (300 MG TOTAL) BY MOUTH DAILY. MUST KEEP SCHEDULED APPT FOR FUTURE REFILLS 90 capsule 1   famotidine (PEPCID) 40 MG tablet Take 1 tablet (40 mg total)  by mouth daily as needed. (Patient taking differently: Take 40 mg by mouth daily as needed for heartburn or indigestion.) 90 tablet 1   fexofenadine (ALLEGRA) 180 MG tablet TAKE 1 TABLET BY MOUTH EVERY DAY (Patient taking differently: Take 180 mg by mouth as needed for allergies.) 90 tablet 3   Fluticasone-Salmeterol (ADVAIR DISKUS) 250-50 MCG/DOSE AEPB Inhale 1 puff into the lungs 2 (two) times daily. (Patient taking differently: Inhale 1 puff into the lungs as needed (shortness of breath).) 60 each 5   Fluticasone-Umeclidin-Vilant (TRELEGY ELLIPTA) 200-62.5-25 MCG/ACT AEPB Inhale 1 puff into the lungs daily at 6 (six) AM. 1 each 0   furosemide (LASIX) 40 MG tablet Take 1 tablet (40 mg total) by mouth daily. 90 tablet 3   HYDROcodone-acetaminophen (NORCO/VICODIN) 5-325 MG tablet Take 1 tablet by  mouth every 6 (six) hours as needed. 20 tablet 0   insulin lispro protamine-insulin lispro (HUMALOG 75/25) (75-25) 100 UNIT/ML SUSP Inject 15-30 Units into the skin See admin instructions. Inject 15 units in the morning, 5 units at lunch, and 30 units at dinner.     isosorbide mononitrate (IMDUR) 60 MG 24 hr tablet Take one tablet ( 60 mg ) twice daily 8 hours apart. (Patient taking differently: Take 60 mg by mouth 2 (two) times daily. Take 8 hours apart.) 180 tablet 3   metoprolol succinate (TOPROL-XL) 100 MG 24 hr tablet TAKE 1 TABLET BY MOUTH EVERY DAY 90 tablet 3   Multiple Vitamin (MULTIVITAMIN WITH MINERALS) TABS Take 1 tablet by mouth daily.     Nebulizers (COMPRESSOR/NEBULIZER) MISC Use with albuterol 1 each 0   nitroGLYCERIN (NITROSTAT) 0.4 MG SL tablet Place 0.4 mg under the tongue every 5 (five) minutes as needed for chest pain.     nortriptyline (PAMELOR) 50 MG capsule TAKE 2 CAPSULES (100 MG TOTAL) BY MOUTH AT BEDTIME. 180 capsule 1   ONE TOUCH ULTRA TEST test strip 1 each by Other route 2 (two) times daily.  3   ONETOUCH DELICA LANCETS 00X MISC 3 (three) times daily. for testing  0   Spacer/Aero-Holding Chambers (AEROCHAMBER MV) inhaler Use as instructed 1 each 0   spironolactone (ALDACTONE) 25 MG tablet Take 1 tablet (25 mg total) by mouth daily. 90 tablet 3   Study - ORION 4 - inclisiran 300 mg/1.75m or placebo SQ injection (PI-Stuckey) Inject 300 mg into the skin every 6 (six) months.     Tiotropium Bromide Monohydrate (SPIRIVA RESPIMAT) 2.5 MCG/ACT AERS Inhale 2 puffs into the lungs daily. 4 g 6   traMADol (ULTRAM) 50 MG tablet Take 50 mg by mouth every 6 (six) hours.     Current Facility-Administered Medications on File Prior to Visit  Medication Dose Route Frequency Provider Last Rate Last Admin   cyanocobalamin ((VITAMIN B-12)) injection 1,000 mcg  1,000 mcg Intramuscular Q30 days BBinnie Rail MD   1,000 mcg at 03/26/16 1658     Review of Systems     Objective:    Vitals:   07/13/21 1008  BP: 128/80  Pulse: 60  Resp: 18  Temp: 98.4 F (36.9 C)   BP Readings from Last 3 Encounters:  07/13/21 128/80  05/01/21 100/70  04/02/21 115/73   Wt Readings from Last 3 Encounters:  07/13/21 182 lb (82.6 kg)  05/01/21 184 lb (83.5 kg)  04/02/21 182 lb 3.2 oz (82.6 kg)   Body mass index is 33.29 kg/m.    Physical Exam     Lab Results  Component Value Date  WBC 7.7 03/16/2021   HGB 12.2 03/16/2021   HCT 37.2 03/16/2021   PLT 336.0 03/16/2021   GLUCOSE 68 (L) 03/16/2021   CHOL 108 10/21/2020   TRIG 125 10/21/2020   HDL 27 (L) 10/21/2020   LDLCALC 58 10/21/2020   ALT 13 03/16/2021   AST 17 03/16/2021   NA 141 03/16/2021   K 4.1 03/16/2021   CL 101 03/16/2021   CREATININE 0.86 03/16/2021   BUN 25 (H) 03/16/2021   CO2 35 (H) 03/16/2021   TSH 1.13 03/16/2021   INR WILL FOLLOW 03/07/2019   HGBA1C 9.5 (H) 03/16/2021   MICROALBUR 1.6 03/16/2021     Assessment & Plan:    See Problem List for Assessment and Plan of chronic medical problems.

## 2021-07-13 ENCOUNTER — Ambulatory Visit (INDEPENDENT_AMBULATORY_CARE_PROVIDER_SITE_OTHER): Payer: Medicare Other | Admitting: Internal Medicine

## 2021-07-13 VITALS — BP 128/80 | HR 60 | Temp 98.4°F | Resp 18 | Ht 62.0 in | Wt 182.0 lb

## 2021-07-13 DIAGNOSIS — E1059 Type 1 diabetes mellitus with other circulatory complications: Secondary | ICD-10-CM | POA: Diagnosis not present

## 2021-07-13 DIAGNOSIS — R0989 Other specified symptoms and signs involving the circulatory and respiratory systems: Secondary | ICD-10-CM

## 2021-07-13 MED ORDER — DILTIAZEM HCL ER COATED BEADS 300 MG PO CP24: ORAL_CAPSULE | ORAL | 1 refills | Status: DC

## 2021-07-13 NOTE — Patient Instructions (Addendum)
    A referral was ordered for Endocrine.     Someone from that office will call you to schedule an appointment.    Return for follow up as scheduled.

## 2021-07-13 NOTE — Assessment & Plan Note (Signed)
Chronic Blood pressure very well controlled today Diltiazem 300 mg daily, Imdur 60 mg twice daily, metoprolol XL 100 mg daily, spironolactone 25 mg daily

## 2021-07-13 NOTE — Assessment & Plan Note (Addendum)
Chronic Not ideally controlled Sugars have been high-higher recently because of steroid injection, but were not controlled prior to that Currently taking Humalog 75/25 15 units in the morning, 5 units at lunch and 30 units at dinner Has not tolerated oral diabetic medications well-have made her sick Discussed making some adjustments into her insulin which she does feel comfortable with depending on her sugars Needs to see a new endocrinologist-referral ordered for [facility]

## 2021-07-17 ENCOUNTER — Telehealth: Payer: Self-pay

## 2021-07-17 MED ORDER — DILTIAZEM HCL ER COATED BEADS 240 MG PO CP24: 240.0000 mg | ORAL_CAPSULE | Freq: Every day | ORAL | 2 refills | Status: DC

## 2021-07-17 NOTE — Telephone Encounter (Signed)
Will go back to diltiazem 240 mg daily scription sent to pharmacy

## 2021-07-17 NOTE — Telephone Encounter (Signed)
Pt is calling to report that she is experiencing Nausea and Dizziness from taking diltiazem (CARDIZEM CD) 300 MG 24 hr capsule. Pt states that the latest Rx picked up appears to be a different drug Company from before and it is now capsule.  Last time taking 07/15/21. Pt is feeling better since skipping a dose.  Please advise.

## 2021-07-17 NOTE — Telephone Encounter (Signed)
Has her blood pressure been better controlled?  She could contact the pharmacy to see if they have the same medication from a different manufacture, but if not then we can go back to her previous dose, but we may need to do something else for her blood pressure

## 2021-07-22 ENCOUNTER — Telehealth: Payer: Self-pay | Admitting: Cardiovascular Disease

## 2021-07-22 ENCOUNTER — Other Ambulatory Visit: Payer: Medicare Other

## 2021-07-22 DIAGNOSIS — R0602 Shortness of breath: Secondary | ICD-10-CM

## 2021-07-22 DIAGNOSIS — R0789 Other chest pain: Secondary | ICD-10-CM

## 2021-07-22 MED ORDER — NITROGLYCERIN 0.4 MG SL SUBL: 0.4000 mg | SUBLINGUAL_TABLET | SUBLINGUAL | 3 refills | Status: AC | PRN

## 2021-07-22 NOTE — Telephone Encounter (Signed)
Pt having continuous left sided chest pressure.  It is now interrupting her sleep.  Not worse with taking a deep breath.  Walking from bedroom to kitchen she is drenching in sweat and very SOB.  Once she stops to rest she feels like she's been on a treadmill.  Some of the times the pain goes up into her left arm.    Nitro has worked but she ran out.  Will refill.  Takes Tylenol which helps for about 2 hrs.  BP 163/92.  Pressing on the chest area hurts her.  "I am so nauseated that I don't want food."  This has been going on almost 2 weeks.   Hx of CAD, low risk myoview Jan 2022. No recent illnesses Can't lie flat to sleep "cuts my breath".  No swelling.  Reports stable weights. She saw her PCP recently for symptoms.  At that time pt thought it was her BP med and PCP decreased that but it did not help any symptoms.  I reviewed with Dr. Tamala Julian (DOD) who recommends she come for lab work today and be seen as soon as possible in the office.  I added her to lab schedule today and DOD schedule tomorrow.  He also adv that she use the ntg and to report back to [place] if it is helping or not as this may help guide her treatment.  The patient is aware and in agreement with this plan and is grateful for assistance.

## 2021-07-22 NOTE — Telephone Encounter (Signed)
Pt c/o of Chest Pain: STAT if CP now or developed within 24 hours  1. Are you having CP right now? Yes   2. Are you experiencing any other symptoms (ex. SOB, nausea, vomiting, sweating)? Nausea, SOB, sweating, no appetite  3. How long have you been experiencing CP? 2 weeks  4. Is your CP continuous or coming and going? continuous  5. Have you taken Nitroglycerin?                                                                                                                                                                                                       ?

## 2021-07-23 ENCOUNTER — Ambulatory Visit (INDEPENDENT_AMBULATORY_CARE_PROVIDER_SITE_OTHER): Payer: Medicare Other | Admitting: Cardiology

## 2021-07-23 ENCOUNTER — Encounter: Payer: Self-pay | Admitting: Cardiology

## 2021-07-23 ENCOUNTER — Other Ambulatory Visit: Payer: Self-pay | Admitting: Cardiology

## 2021-07-23 VITALS — BP 114/72 | HR 52 | Ht 62.0 in | Wt 181.0 lb

## 2021-07-23 DIAGNOSIS — I2510 Atherosclerotic heart disease of native coronary artery without angina pectoris: Secondary | ICD-10-CM | POA: Diagnosis not present

## 2021-07-23 DIAGNOSIS — E7800 Pure hypercholesterolemia, unspecified: Secondary | ICD-10-CM | POA: Diagnosis not present

## 2021-07-23 DIAGNOSIS — I10 Essential (primary) hypertension: Secondary | ICD-10-CM

## 2021-07-23 DIAGNOSIS — I5032 Chronic diastolic (congestive) heart failure: Secondary | ICD-10-CM | POA: Diagnosis not present

## 2021-07-23 DIAGNOSIS — R079 Chest pain, unspecified: Secondary | ICD-10-CM

## 2021-07-23 LAB — BASIC METABOLIC PANEL
BUN/Creatinine Ratio: 18 (ref 12–28)
BUN: 16 mg/dL (ref 8–27)
CO2: 29 mmol/L (ref 20–29)
Calcium: 9.6 mg/dL (ref 8.7–10.3)
Chloride: 100 mmol/L (ref 96–106)
Creatinine, Ser: 0.89 mg/dL (ref 0.57–1.00)
Glucose: 159 mg/dL — ABNORMAL HIGH (ref 70–99)
Potassium: 4 mmol/L (ref 3.5–5.2)
Sodium: 143 mmol/L (ref 134–144)
eGFR: 65 mL/min/{1.73_m2} (ref >59)

## 2021-07-23 LAB — PRO B NATRIURETIC PEPTIDE: NT-Pro BNP: 574 pg/mL (ref 0–738)

## 2021-07-23 LAB — CBC
Hematocrit: 37.9 % (ref 34.0–46.6)
Hemoglobin: 12.3 g/dL (ref 11.1–15.9)
MCH: 27 pg (ref 26.6–33.0)
MCHC: 32.5 g/dL (ref 31.5–35.7)
MCV: 83 fL (ref 79–97)
Platelets: 440 10*3/uL (ref 150–450)
RBC: 4.55 x10E6/uL (ref 3.77–5.28)
RDW: 12.7 % (ref 11.7–15.4)
WBC: 6 10*3/uL (ref 3.4–10.8)

## 2021-07-23 NOTE — Progress Notes (Signed)
Cardiology Office Note:    Date:  07/23/2021   ID:  [name], DOB 02/19/1940, MRN [id number]  PCP:  Binnie Rail, MD   [facility] Providers Cardiologist:  Mertie Moores, MD Cardiology APP:  Sharmon Revere       Referring MD: Binnie Rail, MD   Chief Complaint:  Coronary Artery Disease, Hypertension, Hyperlipidemia, Congestive Heart Failure, and Shortness of Breath    Patient Profile:    [name] is a 81 y.o. female with:  Coronary artery disease S/p CABG Myoview 01/2016: Low Risk Cath 11/18: 2/3 grafts patent Myoview 1/22: low risk  RBBB Chronic Diastolic CHF Echocardiogram 02/2017: EF 65-70, Gr 2 DD Diabetes mellitus Hyperlipidemia Hypertension COPD Echocardiogram 1/19: PASP 44 Mital stenosis (mild by Echocardiogram 1/19)     Prior CV studies: GATED SPECT MYO PERF W/LEXISCAN STRESS 1D 02/20/2020 EF 59, no ischemia or infarction; low risk    Carotid US 02/18/17 Bilateral ICA 1-39    Echocardiogram 02/17/2017 EF 65-70, no RWMA, Gr 2 DD, severe MAC, mild MS (mean 5), mild LAE, trivial PI, PASP 44   Event Monitor 01/2017 NSR No significant arrhythmias to explain syncope and dizzy symptoms   Cardiac catheterization 12/14/16 LAD prox 80; D1 ost 100 RCA prox 25, mid 25 S-PDA 100 L-LAD patent S-D1 patent EF 55-65  Nuclear stress test 01/12/16 EF 62, normal perfusion, low risk   Echocardiogram 09/18/15 EF 60-65, normal wall motion, abnormal relaxation, moderate to severe MAC, mild mitral stenosis (mean gradient 4), PASP 42   Carotid US 09/08/15 Bilateral ICA 1-39   Echocardiogram 01/07/15 EF 60-65, normal wall motion, grade 1 diastolic dysfunction, moderate to severe MAC, mild LAE, PASP 46, mildly reduced RV SF   Nuclear stress test 06/25/14 Normal perfusion, EF 55-65, low risk   Nuclear stress test 03/22/13 EF 68, no significant scar or ischemia, low risk   LHC 04/20/12:  Proximal LAD 50%, mid LAD 50-60% circumflex normal proximal  RCA 40-50% SVG-RCA occluded LIMA-LAD patent SVG-D1 patent. Medical therapy was recommended   Echo 09/2011: EF 60-55%, normal wall motion, grade 1 diastolic dysfunction, severe MAC.    Nuclear study 01/2011: EF 72%, small fixed inferobasal defect suggestive of thinning, no ischemia.     History of Present Illness: [name] was last seen in 4/22 and 7/22 by Richardson Dopp, PA.  She was still having symptoms of chest pain.  This was chronic since her CABG but was somewhat worse.  She had a recent Myoview that was low risk so her Imdur was adjusted.  She was then seen back by him again and she had some improvement with increasing her Isosorbide.  She has shortness of breath with exertion that is chronic she uses her inhaler for asthma with relief. She has not had syncope.  She sleeps on 2 pillows.    Today she is here for evaluation because she has had 2-week history of increasing shortness of breath, dizziness, nausea, Chest pain and lack of appetite.  She says just walking from 1 room to the other in her house makes her feel like she has run a mile.  The dizziness is more of an issue with being off balance and stumbling and has had for over a year and not feeling like she is going to pass out.   She describes the discomfort in her chest as a pressure over her sternum and left chest.  This occurs when she is in bed in the am and  gets a headache with it.  She will be drenched in sweat when she gets to her kitchen.  The chest discomfort worsens when she gets up to ambulate. She says that she has had LE edema but that has resolved after she increased her Lasix to 29m qam and 418mqpm.      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 For shortness of breath.   albuterol (PROVENTIL) (2.5 MG/3ML) 0.083% nebulizer solution USE 1 VIAL IN NEBULIZER 4 TIMES DAILY (Patient taking differently: Take 2.5 mg by nebulization 4 (four) times daily.)   alum & mag hydroxide-simeth (MAALOX/MYLANTA) 200-200-20 MG/5ML suspension Take 30 mLs as needed by mouth for indigestion or heartburn.   aspirin EC 81 MG tablet Take 1 tablet (81 mg total) by mouth daily.   baclofen (LIORESAL) 20 MG tablet TAKE 2 TABLETS BY MOUTH 2 TIMES DAILY.   Bempedoic Acid-Ezetimibe (NEXLIZET) 180-10 MG TABS Take 1 tablet by mouth daily.   Blood Glucose Monitoring Suppl (ONE TOUCH ULTRA MINI) w/Device KIT 3 (three) times daily. for testing   calcium carbonate (OS-CAL) 1250 (500 Ca) MG chewable tablet Chew 1 tablet by mouth as needed for heartburn.   calcium carbonate (TUMS - DOSED IN MG ELEMENTAL CALCIUM) 500 MG chewable tablet Chew 2 tablets daily as needed by mouth for indigestion or heartburn.   Cholecalciferol (VITAMIN D) 2000 UNITS tablet Take 2,000 Units by mouth daily.   clopidogrel (PLAVIX) 75 MG tablet Take 75 mg by mouth daily.   Continuous Blood Gluc Sensor (FREESTYLE LIBRE 14 DAY SENSOR) MISC See admin instructions.   Cyanocobalamin (VITAMIN B-12 IJ) Inject 1,000 mcg as directed every 30 (thirty)  days.   cyclobenzaprine (FLEXERIL) 5 MG tablet Take 5 mg by mouth as needed for muscle spasms.   diazepam (VALIUM) 5 MG tablet Take 1 tablet (5 mg total) by mouth daily.   diltiazem (CARDIZEM CD) 240 MG 24 hr capsule Take 1 capsule (240 mg total) by mouth daily.   famotidine (PEPCID) 40 MG tablet Take 1 tablet (40 mg total) by mouth daily as needed. (Patient taking differently: Take 40 mg by mouth daily as needed for heartburn or indigestion.)   fexofenadine (ALLEGRA) 180 MG tablet TAKE 1 TABLET BY MOUTH EVERY DAY (Patient taking differently: Take 180 mg by mouth as needed  for allergies.)   Fluticasone-Salmeterol (ADVAIR DISKUS) 250-50 MCG/DOSE AEPB Inhale 1 puff into the lungs 2 (two) times daily. (Patient taking differently: Inhale 1 puff into the lungs as needed (shortness of breath).)   Fluticasone-Umeclidin-Vilant (TRELEGY ELLIPTA) 200-62.5-25 MCG/ACT AEPB Inhale 1 puff into the lungs daily at 6 (six) AM.   furosemide (LASIX) 40 MG tablet Take 1 tablet (40 mg total) by mouth daily.   HYDROcodone-acetaminophen (NORCO/VICODIN) 5-325 MG tablet Take 1 tablet by mouth every 6 (six) hours as needed.   insulin lispro protamine-insulin lispro (HUMALOG 75/25) (75-25) 100 UNIT/ML SUSP Inject 15-30 Units into the skin See admin instructions. Inject 15 units in the morning, 5 units at lunch, and 30 units at dinner.   isosorbide mononitrate (IMDUR) 60 MG 24 hr tablet Take one tablet ( 60 mg ) twice daily 8 hours apart. (Patient taking differently: Take 60 mg by mouth 2 (two) times daily. Take 8 hours apart.)   metoprolol succinate (TOPROL-XL) 100 MG 24 hr tablet TAKE 1 TABLET BY MOUTH EVERY DAY   Multiple Vitamin (MULTIVITAMIN WITH MINERALS) TABS Take 1 tablet by mouth daily.   Nebulizers (COMPRESSOR/NEBULIZER) MISC Use with albuterol   nitroGLYCERIN (NITROSTAT) 0.4 MG SL tablet Place 1 tablet (0.4 mg total) under the tongue every 5 (five) minutes as needed for chest pain.   nortriptyline (PAMELOR) 50 MG  capsule TAKE 2 CAPSULES (100 MG TOTAL) BY MOUTH AT BEDTIME.   ONE TOUCH ULTRA TEST test strip 1 each by Other route 2 (two) times daily.   ONETOUCH DELICA LANCETS 94B MISC 3 (three) times daily. for testing   Spacer/Aero-Holding Chambers (AEROCHAMBER MV) inhaler Use as instructed   spironolactone (ALDACTONE) 25 MG tablet Take 1 tablet (25 mg total) by mouth daily.   Study - ORION 4 - inclisiran 300 mg/1.69m or placebo SQ injection (PI-Stuckey) Inject 300 mg into the skin every 6 (six) months.   Tiotropium Bromide Monohydrate (SPIRIVA RESPIMAT) 2.5 MCG/ACT AERS Inhale 2 puffs into the lungs daily.   traMADol (ULTRAM) 50 MG tablet Take 50 mg by mouth every 6 (six) hours.   Current Facility-Administered Medications for the 07/23/21 encounter (Office Visit) with TSueanne Margarita MD  Medication   cyanocobalamin ((VITAMIN B-12)) injection 1,000 mcg     Allergies:   Amlodipine, Banana, Co q10 [coenzyme q10], Codeine, Insulin aspart, Lisinopril, Metformin and related, Morphine, Pentazocine, Pravastatin, Repatha [evolocumab], Statins, Sulfa antibiotics, Sulfonamide derivatives, and Tramadol hcl   Social History   Tobacco Use   Smoking status: Former    Packs/day: 0.50    Years: 21.00    Total pack years: 10.50    Types: Cigarettes    Start date: 02/09/1955    Quit date: 02/09/1976    Years since quitting: 45.4   Smokeless tobacco: Never  Vaping Use   Vaping Use: Never used  Substance Use Topics   Alcohol use: No    Alcohol/week: 0.0 standard drinks of alcohol   Drug use: No     Family Hx: The patient's family history includes Diabetes in her maternal grandfather and mother; Emphysema in her paternal aunt and paternal uncle; Esophageal cancer (age of onset: 311 in her brother; Healthy in her child; Heart attack in her father; Heart disease in her maternal grandfather; Heart failure in her maternal grandfather; Rheum arthritis in her sister. There is no history of Neuropathy, Multiple sclerosis,  Colon cancer, Colon polyps, Rectal cancer, or Stomach cancer.  ROS   EKGs/Labs/Other Test Reviewed:    EKG:  EKG  is  ordered today.  The ekg ordered today demonstrates sinus bradycardia with RBBB  Recent Labs: 03/16/2021: ALT 13; TSH 1.13 07/22/2021: BUN 16; Creatinine, Ser 0.89; Hemoglobin 12.3; NT-Pro BNP WILL FOLLOW; Platelets 440; Potassium 4.0; Sodium 143   Recent Lipid Panel Lab Results  Component Value Date/Time   CHOL 108 10/21/2020 08:43 AM   TRIG 125 10/21/2020 08:43 AM   HDL 27 (L) 10/21/2020 08:43 AM   LDLCALC 58 10/21/2020 08:43 AM      Risk Assessment/Calculations:      Physical Exam:    VS:  BP 114/72   Pulse (!) 52   Ht _0  (1.575 m)   Wt 181 lb (82.1 kg)   LMP  (LMP Unknown)   SpO2 90%   BMI 33.11 kg/m     Wt Readings from Last 3 Encounters:  07/23/21 181 lb (82.1 kg)  07/13/21 182 lb (82.6 kg)  05/01/21 184 lb (83.5 kg)    GEN: Well nourished, well developed in no acute distress HEENT: Normal NECK: No JVD; No carotid bruits LYMPHATICS: No lymphadenopathy CARDIAC:RRR, no murmurs, rubs, gallops RESPIRATORY:  Clear to auscultation without rales, wheezing or rhonchi  ABDOMEN: Soft, non-tender, non-distended MUSCULOSKELETAL:  No edema; No deformity  SKIN: Warm and dry NEUROLOGIC:  Alert and oriented x 3 PSYCHIATRIC:  Normal affect         ASSESSMENT & PLAN:    1. Coronary artery disease involving native coronary artery of native heart with other form of angina pectoris ([place]) -History of CABG.   -Cardiac catheterization 2018 demonstrated 2/3 grafts patent.  The vein graft to RPDA was occluded.  There was mild nonobstructive disease in the RCA.   -She has had chest discomfort off and on since her bypass surgery.  -Myoview in 1/22 was low risk.   -There has been some suggestion that her chest pain is related to uncontrolled BP in the setting of (HFpEF) heart failure with preserved ejection fraction.   -At last office visit in the summer with  Richardson Dopp, PA he adjusted her Rx for CHF  -Now presenting with worsening shortness of breath and exertional chest pain much worse than her usual angina to the point she cannot complete ADLs.  Her BP is well controlled today and she does not appear volume overloaded. -I think at this point we need to repeat heart cath since her last cath was in 2018 and may have had progression of disease in native vessels -Recommend right and left heart cath since she has significant SOB as well in spite of looking euvolemic on exam today -Shared Decision Making/Informed Consent The risks [stroke (1 in 1000), death (1 in 1000), kidney failure [usually temporary] (1 in 500), bleeding (1 in 200), allergic reaction [possibly serious] (1 in 200)], benefits (diagnostic support and management of coronary artery disease) and alternatives of a cardiac catheterization were discussed in detail with [name] and she is willing to proceed.  -Continue Prescription drug management with Toprol-XL 100 mg daily, Imdur 60 mg daily, Plavix 75 mg daily and aspirin 81 mg daily  2. Chronic heart failure with preserved ejection fraction (HCC) -Chronic NYHA IIb-III.   -She actually does not appear volume overloaded on exam today  -BP is well controlled on exam  -Continue prescription drug therapy with spironolactone 25 mg daily, Toprol-XL 100 mg daily, Lasix 80 mg qam and 6m pm with as needed refills -check BMET since she has been on a higher dose of Lasix  3. Essential  hypertension -BP is adequately controlled  -Continue Toprol-XL 100 mg daily, spironolactone 25 mg daily, Imdur 60 mg daily and Cardizem CD 240 mg daily with as needed refills  4. Mixed hyperlipidemia -She is intolerant to statins.   -She is enrolled in the [place] 4 trial.   -She is also on bempedoic acid, ezetimibe.     Dispo:  No follow-ups on file.   Medication Adjustments/Labs and Tests Ordered: Current medicines are reviewed at length with the patient  today.  Concerns regarding medicines are outlined above.  Tests Ordered: Orders Placed This Encounter  Procedures   EKG 12-Lead   Medication Changes: No orders of the defined types were placed in this encounter.   Signed, Fransico Him, MD  07/23/2021 1:29 PM    [facility] [place], [place], [place]  [address] Phone: [phone]; Fax: [phone]

## 2021-07-23 NOTE — H&P (View-Only) (Signed)
Cardiology Office Note:    Date:  07/23/2021   ID:  [name], DOB 02/19/1940, MRN [id number]  PCP:  Binnie Rail, MD   [facility] Providers Cardiologist:  Mertie Moores, MD Cardiology APP:  Sharmon Revere       Referring MD: Binnie Rail, MD   Chief Complaint:  Coronary Artery Disease, Hypertension, Hyperlipidemia, Congestive Heart Failure, and Shortness of Breath    Patient Profile:    [name] is a 81 y.o. female with:  Coronary artery disease S/p CABG Myoview 01/2016: Low Risk Cath 11/18: 2/3 grafts patent Myoview 1/22: low risk  RBBB Chronic Diastolic CHF Echocardiogram 02/2017: EF 65-70, Gr 2 DD Diabetes mellitus Hyperlipidemia Hypertension COPD Echocardiogram 1/19: PASP 44 Mital stenosis (mild by Echocardiogram 1/19)     Prior CV studies: GATED SPECT MYO PERF W/LEXISCAN STRESS 1D 02/20/2020 EF 59, no ischemia or infarction; low risk    Carotid US 02/18/17 Bilateral ICA 1-39    Echocardiogram 02/17/2017 EF 65-70, no RWMA, Gr 2 DD, severe MAC, mild MS (mean 5), mild LAE, trivial PI, PASP 44   Event Monitor 01/2017 NSR No significant arrhythmias to explain syncope and dizzy symptoms   Cardiac catheterization 12/14/16 LAD prox 80; D1 ost 100 RCA prox 25, mid 25 S-PDA 100 L-LAD patent S-D1 patent EF 55-65  Nuclear stress test 01/12/16 EF 62, normal perfusion, low risk   Echocardiogram 09/18/15 EF 60-65, normal wall motion, abnormal relaxation, moderate to severe MAC, mild mitral stenosis (mean gradient 4), PASP 42   Carotid US 09/08/15 Bilateral ICA 1-39   Echocardiogram 01/07/15 EF 60-65, normal wall motion, grade 1 diastolic dysfunction, moderate to severe MAC, mild LAE, PASP 46, mildly reduced RV SF   Nuclear stress test 06/25/14 Normal perfusion, EF 55-65, low risk   Nuclear stress test 03/22/13 EF 68, no significant scar or ischemia, low risk   LHC 04/20/12:  Proximal LAD 50%, mid LAD 50-60% circumflex normal proximal  RCA 40-50% SVG-RCA occluded LIMA-LAD patent SVG-D1 patent. Medical therapy was recommended   Echo 09/2011: EF 60-55%, normal wall motion, grade 1 diastolic dysfunction, severe MAC.    Nuclear study 01/2011: EF 72%, small fixed inferobasal defect suggestive of thinning, no ischemia.     History of Present Illness: Ms. Galanti was last seen in 4/22 and 7/22 by Richardson Dopp, PA.  She was still having symptoms of chest pain.  This was chronic since her CABG but was somewhat worse.  She had a recent Myoview that was low risk so her Imdur was adjusted.  She was then seen back by him again and she had some improvement with increasing her Isosorbide.  She has shortness of breath with exertion that is chronic she uses her inhaler for asthma with relief. She has not had syncope.  She sleeps on 2 pillows.    Today she is here for evaluation because she has had 2-week history of increasing shortness of breath, dizziness, nausea, Chest pain and lack of appetite.  She says just walking from 1 room to the other in her house makes her feel like she has run a mile.  The dizziness is more of an issue with being off balance and stumbling and has had for over a year and not feeling like she is going to pass out.   She describes the discomfort in her chest as a pressure over her sternum and left chest.  This occurs when she is in bed in the am and  gets a headache with it.  She will be drenched in sweat when she gets to her kitchen.  The chest discomfort worsens when she gets up to ambulate. She says that she has had LE edema but that has resolved after she increased her Lasix to 29m qam and 418mqpm.      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 For shortness of breath.   albuterol (PROVENTIL) (2.5 MG/3ML) 0.083% nebulizer solution USE 1 VIAL IN NEBULIZER 4 TIMES DAILY (Patient taking differently: Take 2.5 mg by nebulization 4 (four) times daily.)   alum & mag hydroxide-simeth (MAALOX/MYLANTA) 200-200-20 MG/5ML suspension Take 30 mLs as needed by mouth for indigestion or heartburn.   aspirin EC 81 MG tablet Take 1 tablet (81 mg total) by mouth daily.   baclofen (LIORESAL) 20 MG tablet TAKE 2 TABLETS BY MOUTH 2 TIMES DAILY.   Bempedoic Acid-Ezetimibe (NEXLIZET) 180-10 MG TABS Take 1 tablet by mouth daily.   Blood Glucose Monitoring Suppl (ONE TOUCH ULTRA MINI) w/Device KIT 3 (three) times daily. for testing   calcium carbonate (OS-CAL) 1250 (500 Ca) MG chewable tablet Chew 1 tablet by mouth as needed for heartburn.   calcium carbonate (TUMS - DOSED IN MG ELEMENTAL CALCIUM) 500 MG chewable tablet Chew 2 tablets daily as needed by mouth for indigestion or heartburn.   Cholecalciferol (VITAMIN D) 2000 UNITS tablet Take 2,000 Units by mouth daily.   clopidogrel (PLAVIX) 75 MG tablet Take 75 mg by mouth daily.   Continuous Blood Gluc Sensor (FREESTYLE LIBRE 14 DAY SENSOR) MISC See admin instructions.   Cyanocobalamin (VITAMIN B-12 IJ) Inject 1,000 mcg as directed every 30 (thirty)  days.   cyclobenzaprine (FLEXERIL) 5 MG tablet Take 5 mg by mouth as needed for muscle spasms.   diazepam (VALIUM) 5 MG tablet Take 1 tablet (5 mg total) by mouth daily.   diltiazem (CARDIZEM CD) 240 MG 24 hr capsule Take 1 capsule (240 mg total) by mouth daily.   famotidine (PEPCID) 40 MG tablet Take 1 tablet (40 mg total) by mouth daily as needed. (Patient taking differently: Take 40 mg by mouth daily as needed for heartburn or indigestion.)   fexofenadine (ALLEGRA) 180 MG tablet TAKE 1 TABLET BY MOUTH EVERY DAY (Patient taking differently: Take 180 mg by mouth as needed  for allergies.)   Fluticasone-Salmeterol (ADVAIR DISKUS) 250-50 MCG/DOSE AEPB Inhale 1 puff into the lungs 2 (two) times daily. (Patient taking differently: Inhale 1 puff into the lungs as needed (shortness of breath).)   Fluticasone-Umeclidin-Vilant (TRELEGY ELLIPTA) 200-62.5-25 MCG/ACT AEPB Inhale 1 puff into the lungs daily at 6 (six) AM.   furosemide (LASIX) 40 MG tablet Take 1 tablet (40 mg total) by mouth daily.   HYDROcodone-acetaminophen (NORCO/VICODIN) 5-325 MG tablet Take 1 tablet by mouth every 6 (six) hours as needed.   insulin lispro protamine-insulin lispro (HUMALOG 75/25) (75-25) 100 UNIT/ML SUSP Inject 15-30 Units into the skin See admin instructions. Inject 15 units in the morning, 5 units at lunch, and 30 units at dinner.   isosorbide mononitrate (IMDUR) 60 MG 24 hr tablet Take one tablet ( 60 mg ) twice daily 8 hours apart. (Patient taking differently: Take 60 mg by mouth 2 (two) times daily. Take 8 hours apart.)   metoprolol succinate (TOPROL-XL) 100 MG 24 hr tablet TAKE 1 TABLET BY MOUTH EVERY DAY   Multiple Vitamin (MULTIVITAMIN WITH MINERALS) TABS Take 1 tablet by mouth daily.   Nebulizers (COMPRESSOR/NEBULIZER) MISC Use with albuterol   nitroGLYCERIN (NITROSTAT) 0.4 MG SL tablet Place 1 tablet (0.4 mg total) under the tongue every 5 (five) minutes as needed for chest pain.   nortriptyline (PAMELOR) 50 MG  capsule TAKE 2 CAPSULES (100 MG TOTAL) BY MOUTH AT BEDTIME.   ONE TOUCH ULTRA TEST test strip 1 each by Other route 2 (two) times daily.   ONETOUCH DELICA LANCETS 94B MISC 3 (three) times daily. for testing   Spacer/Aero-Holding Chambers (AEROCHAMBER MV) inhaler Use as instructed   spironolactone (ALDACTONE) 25 MG tablet Take 1 tablet (25 mg total) by mouth daily.   Study - ORION 4 - inclisiran 300 mg/1.73m or placebo SQ injection (PI-Stuckey) Inject 300 mg into the skin every 6 (six) months.   Tiotropium Bromide Monohydrate (SPIRIVA RESPIMAT) 2.5 MCG/ACT AERS Inhale 2 puffs into the lungs daily.   traMADol (ULTRAM) 50 MG tablet Take 50 mg by mouth every 6 (six) hours.   Current Facility-Administered Medications for the 07/23/21 encounter (Office Visit) with TSueanne Margarita MD  Medication   cyanocobalamin ((VITAMIN B-12)) injection 1,000 mcg     Allergies:   Amlodipine, Banana, Co q10 [coenzyme q10], Codeine, Insulin aspart, Lisinopril, Metformin and related, Morphine, Pentazocine, Pravastatin, Repatha [evolocumab], Statins, Sulfa antibiotics, Sulfonamide derivatives, and Tramadol hcl   Social History   Tobacco Use   Smoking status: Former    Packs/day: 0.50    Years: 21.00    Total pack years: 10.50    Types: Cigarettes    Start date: 02/09/1955    Quit date: 02/09/1976    Years since quitting: 45.4   Smokeless tobacco: Never  Vaping Use   Vaping Use: Never used  Substance Use Topics   Alcohol use: No    Alcohol/week: 0.0 standard drinks of alcohol   Drug use: No     Family Hx: The patient's family history includes Diabetes in her maternal grandfather and mother; Emphysema in her paternal aunt and paternal uncle; Esophageal cancer (age of onset: 386 in her brother; Healthy in her child; Heart attack in her father; Heart disease in her maternal grandfather; Heart failure in her maternal grandfather; Rheum arthritis in her sister. There is no history of Neuropathy, Multiple sclerosis,  Colon cancer, Colon polyps, Rectal cancer, or Stomach cancer.  ROS   EKGs/Labs/Other Test Reviewed:    EKG:  EKG  is  ordered today.  The ekg ordered today demonstrates sinus bradycardia with RBBB  Recent Labs: 03/16/2021: ALT 13; TSH 1.13 07/22/2021: BUN 16; Creatinine, Ser 0.89; Hemoglobin 12.3; NT-Pro BNP WILL FOLLOW; Platelets 440; Potassium 4.0; Sodium 143   Recent Lipid Panel Lab Results  Component Value Date/Time   CHOL 108 10/21/2020 08:43 AM   TRIG 125 10/21/2020 08:43 AM   HDL 27 (L) 10/21/2020 08:43 AM   LDLCALC 58 10/21/2020 08:43 AM      Risk Assessment/Calculations:      Physical Exam:    VS:  BP 114/72   Pulse (!) 52   Ht _0  (1.575 m)   Wt 181 lb (82.1 kg)   LMP  (LMP Unknown)   SpO2 90%   BMI 33.11 kg/m     Wt Readings from Last 3 Encounters:  07/23/21 181 lb (82.1 kg)  07/13/21 182 lb (82.6 kg)  05/01/21 184 lb (83.5 kg)    GEN: Well nourished, well developed in no acute distress HEENT: Normal NECK: No JVD; No carotid bruits LYMPHATICS: No lymphadenopathy CARDIAC:RRR, no murmurs, rubs, gallops RESPIRATORY:  Clear to auscultation without rales, wheezing or rhonchi  ABDOMEN: Soft, non-tender, non-distended MUSCULOSKELETAL:  No edema; No deformity  SKIN: Warm and dry NEUROLOGIC:  Alert and oriented x 3 PSYCHIATRIC:  Normal affect         ASSESSMENT & PLAN:    1. Coronary artery disease involving native coronary artery of native heart with other form of angina pectoris ([place]) -History of CABG.   -Cardiac catheterization 2018 demonstrated 2/3 grafts patent.  The vein graft to RPDA was occluded.  There was mild nonobstructive disease in the RCA.   -She has had chest discomfort off and on since her bypass surgery.  -Myoview in 1/22 was low risk.   -There has been some suggestion that her chest pain is related to uncontrolled BP in the setting of (HFpEF) heart failure with preserved ejection fraction.   -At last office visit in the summer with  Richardson Dopp, PA he adjusted her Rx for CHF  -Now presenting with worsening shortness of breath and exertional chest pain much worse than her usual angina to the point she cannot complete ADLs.  Her BP is well controlled today and she does not appear volume overloaded. -I think at this point we need to repeat heart cath since her last cath was in 2018 and may have had progression of disease in native vessels -Recommend right and left heart cath since she has significant SOB as well in spite of looking euvolemic on exam today -Shared Decision Making/Informed Consent The risks [stroke (1 in 1000), death (1 in 1000), kidney failure [usually temporary] (1 in 500), bleeding (1 in 200), allergic reaction [possibly serious] (1 in 200)], benefits (diagnostic support and management of coronary artery disease) and alternatives of a cardiac catheterization were discussed in detail with Ms. Weist and she is willing to proceed.  -Continue Prescription drug management with Toprol-XL 100 mg daily, Imdur 60 mg daily, Plavix 75 mg daily and aspirin 81 mg daily  2. Chronic heart failure with preserved ejection fraction (HCC) -Chronic NYHA IIb-III.   -She actually does not appear volume overloaded on exam today  -BP is well controlled on exam  -Continue prescription drug therapy with spironolactone 25 mg daily, Toprol-XL 100 mg daily, Lasix 80 mg qam and 6m pm with as needed refills -check BMET since she has been on a higher dose of Lasix  3. Essential  hypertension -BP is adequately controlled  -Continue Toprol-XL 100 mg daily, spironolactone 25 mg daily, Imdur 60 mg daily and Cardizem CD 240 mg daily with as needed refills  4. Mixed hyperlipidemia -She is intolerant to statins.   -She is enrolled in the [place] 4 trial.   -She is also on bempedoic acid, ezetimibe.     Dispo:  No follow-ups on file.   Medication Adjustments/Labs and Tests Ordered: Current medicines are reviewed at length with the patient  today.  Concerns regarding medicines are outlined above.  Tests Ordered: Orders Placed This Encounter  Procedures   EKG 12-Lead   Medication Changes: No orders of the defined types were placed in this encounter.   Signed, Fransico Him, MD  07/23/2021 1:29 PM    [facility] [place], [place], [place]  [address] Phone: [phone]; Fax: [phone]

## 2021-07-23 NOTE — Patient Instructions (Signed)
Medication Instructions:  The current medical regimen is effective;  continue present plan and medications.  *If you need a refill on your cardiac medications before your next appointment, please call your pharmacy*   Lab Work: Completed 07/22/21 If you have labs (blood work) drawn today and your tests are completely normal, you will receive your results only by: [place] (if you have MyChart) OR A paper copy in the mail If you have any lab test that is abnormal or we need to change your treatment, we will call you to review the results.   Testing/Procedures:   [facility] OFFICE [place], [place] [address] Dept: [phone] Loc: [place]  07/23/2021  You are scheduled for a Cardiac Catheterization on Tuesday, June 20 with Dr. Shelva Majestic.  1. Please arrive at the [facility] (Main Entrance A) at [facility]: [place], [place] [address] at 7:00 AM (two hours before your procedure to ensure your preparation). Free valet parking service is available.   Special note: Every effort is made to have your procedure done on time. Please understand that emergencies sometimes delay scheduled procedures.  2. Diet: Do not eat or drink anything after midnight prior to your procedure except sips of water to take medications.  3. Labs: None needed.  4. Medication instructions in preparation for your procedure: Hold Furosemide and Insulin this AM.  On the morning of your procedure, take your Plavix/Clopidogrel and any morning medicines NOT listed above.  You may use sips of water.  5. Plan for one night stay--bring personal belongings. 6. Bring a current list of your medications and current insurance cards. 7. You MUST have a responsible person to drive you home. 8. Someone MUST be with you the first 24 hours after you arrive home or your  discharge will be delayed. 9. Please wear clothes that are easy to get on and off and wear slip-on shoes.  Thank you for allowing [place] to care for you!   -- [facility]   Follow-Up: At [facility], you and your health needs are our priority.  As part of our continuing mission to provide you with exceptional heart care, we have created designated Provider Care Teams.  These Care Teams include your primary Cardiologist (physician) and Advanced Practice Providers (APPs -  Physician Assistants and Nurse Practitioners) who all work together to provide you with the care you need, when you need it.  We recommend signing up for the patient portal called "MyChart".  Sign up information is provided on this After Visit Summary.  MyChart is used to connect with patients for Virtual Visits (Telemedicine).  Patients are able to view lab/test results, encounter notes, upcoming appointments, etc.  Non-urgent messages can be sent to your provider as well.   To learn more about what you can do with MyChart, go to [email or web address].    Your next appointment:   Follow up with Dr Acie Fredrickson after cardiac cath.  Important Information About Sugar

## 2021-07-27 ENCOUNTER — Telehealth: Payer: Self-pay | Admitting: *Deleted

## 2021-07-27 NOTE — Telephone Encounter (Signed)
Cardiac Catheterization scheduled at [facility] for: Tuesday July 28, 2021 9 AM Arrival time and place: [facility] Main Entrance A at: 7 AM   Nothing to eat after midnight prior to procedure, clear liquids until 5 AM day of procedure.  Medication instructions: -Hold:  Lasix/Spironolactone-AM of procedure  Insulin-AM of procedure/1/2 usual dose HS prior to procedure -Except hold medications usual morning medications can be taken with sips of water including aspirin 81 mg and Plavix 75 mg.  Confirmed patient has responsible adult to drive home post procedure and be with patient first 24 hours after arriving home.  Patient reports no new symptoms concerning for COVID-19.  Reviewed procedure instructions with patient.

## 2021-07-28 ENCOUNTER — Encounter (HOSPITAL_COMMUNITY)
Admission: RE | Disposition: A | Discharge status: Home / Self Care | Payer: Self-pay | Source: Home / Self Care | Attending: Cardiovascular Disease

## 2021-07-28 ENCOUNTER — Ambulatory Visit (HOSPITAL_COMMUNITY)
Admission: RE | Admit: 2021-07-28 | Discharge: 2021-07-28 | Disposition: A | Discharge status: Home / Self Care | Payer: Medicare Other | Attending: Cardiovascular Disease | Admitting: Cardiovascular Disease

## 2021-07-28 ENCOUNTER — Other Ambulatory Visit: Payer: Self-pay

## 2021-07-28 DIAGNOSIS — Z7902 Long term (current) use of antithrombotics/antiplatelets: Secondary | ICD-10-CM | POA: Diagnosis not present

## 2021-07-28 DIAGNOSIS — I5032 Chronic diastolic (congestive) heart failure: Secondary | ICD-10-CM | POA: Diagnosis not present

## 2021-07-28 DIAGNOSIS — I110 Hypertensive heart disease with heart failure: Secondary | ICD-10-CM | POA: Insufficient documentation

## 2021-07-28 DIAGNOSIS — E119 Type 2 diabetes mellitus without complications: Secondary | ICD-10-CM | POA: Diagnosis not present

## 2021-07-28 DIAGNOSIS — R079 Chest pain, unspecified: Secondary | ICD-10-CM

## 2021-07-28 DIAGNOSIS — Z951 Presence of aortocoronary bypass graft: Secondary | ICD-10-CM | POA: Diagnosis not present

## 2021-07-28 DIAGNOSIS — Z794 Long term (current) use of insulin: Secondary | ICD-10-CM | POA: Insufficient documentation

## 2021-07-28 DIAGNOSIS — E782 Mixed hyperlipidemia: Secondary | ICD-10-CM | POA: Insufficient documentation

## 2021-07-28 DIAGNOSIS — R0602 Shortness of breath: Secondary | ICD-10-CM | POA: Diagnosis present

## 2021-07-28 DIAGNOSIS — Z87891 Personal history of nicotine dependence: Secondary | ICD-10-CM | POA: Diagnosis not present

## 2021-07-28 DIAGNOSIS — Z7982 Long term (current) use of aspirin: Secondary | ICD-10-CM | POA: Insufficient documentation

## 2021-07-28 DIAGNOSIS — Z79899 Other long term (current) drug therapy: Secondary | ICD-10-CM | POA: Insufficient documentation

## 2021-07-28 DIAGNOSIS — R0609 Other forms of dyspnea: Secondary | ICD-10-CM

## 2021-07-28 DIAGNOSIS — I25719 Atherosclerosis of autologous vein coronary artery bypass graft(s) with unspecified angina pectoris: Secondary | ICD-10-CM | POA: Diagnosis not present

## 2021-07-28 DIAGNOSIS — I25119 Atherosclerotic heart disease of native coronary artery with unspecified angina pectoris: Secondary | ICD-10-CM | POA: Diagnosis not present

## 2021-07-28 DIAGNOSIS — I2582 Chronic total occlusion of coronary artery: Secondary | ICD-10-CM | POA: Insufficient documentation

## 2021-07-28 DIAGNOSIS — I272 Pulmonary hypertension, unspecified: Secondary | ICD-10-CM | POA: Insufficient documentation

## 2021-07-28 HISTORY — PX: RIGHT/LEFT HEART CATH AND CORONARY/GRAFT ANGIOGRAPHY: CATH118267

## 2021-07-28 LAB — POCT I-STAT EG7
Acid-Base Excess: 5 mmol/L — ABNORMAL HIGH (ref 0.0–2.0)
Acid-Base Excess: 6 mmol/L — ABNORMAL HIGH (ref 0.0–2.0)
Bicarbonate: 30.9 mmol/L — ABNORMAL HIGH (ref 20.0–28.0)
Bicarbonate: 31.8 mmol/L — ABNORMAL HIGH (ref 20.0–28.0)
Calcium, Ion: 1.16 mmol/L (ref 1.15–1.40)
Calcium, Ion: 1.23 mmol/L (ref 1.15–1.40)
HCT: 35 % — ABNORMAL LOW (ref 36.0–46.0)
HCT: 36 % (ref 36.0–46.0)
Hemoglobin: 11.9 g/dL — ABNORMAL LOW (ref 12.0–15.0)
Hemoglobin: 12.2 g/dL (ref 12.0–15.0)
O2 Saturation: 63 %
O2 Saturation: 62 %
Potassium: 3.7 mmol/L (ref 3.5–5.1)
Potassium: 3.8 mmol/L (ref 3.5–5.1)
Sodium: 145 mmol/L (ref 135–145)
Sodium: 144 mmol/L (ref 135–145)
TCO2: 32 mmol/L (ref 22–32)
TCO2: 33 mmol/L — ABNORMAL HIGH (ref 22–32)
pCO2, Ven: 51.3 mmHg (ref 44–60)
pCO2, Ven: 52.6 mmHg (ref 44–60)
pH, Ven: 7.388 (ref 7.25–7.43)
pH, Ven: 7.389 (ref 7.25–7.43)
pO2, Ven: 34 mmHg (ref 32–45)
pO2, Ven: 33 mmHg (ref 32–45)

## 2021-07-28 LAB — CBC
HCT: 38.8 % (ref 36.0–46.0)
Hemoglobin: 12.1 g/dL (ref 12.0–15.0)
MCH: 27.1 pg (ref 26.0–34.0)
MCHC: 31.2 g/dL (ref 30.0–36.0)
MCV: 87 fL (ref 80.0–100.0)
Platelets: 406 10*3/uL — ABNORMAL HIGH (ref 150–400)
RBC: 4.46 MIL/uL (ref 3.87–5.11)
RDW: 13.9 % (ref 11.5–15.5)
WBC: 7.1 10*3/uL (ref 4.0–10.5)
nRBC: 0 % (ref 0.0–0.2)

## 2021-07-28 LAB — BASIC METABOLIC PANEL
Anion gap: 9 (ref 5–15)
BUN: 18 mg/dL (ref 8–23)
CO2: 31 mmol/L (ref 22–32)
Calcium: 9.6 mg/dL (ref 8.9–10.3)
Chloride: 105 mmol/L (ref 98–111)
Creatinine, Ser: 0.71 mg/dL (ref 0.44–1.00)
GFR, Estimated: >60 mL/min (ref >60)
Glucose, Bld: 94 mg/dL (ref 70–99)
Potassium: 4.1 mmol/L (ref 3.5–5.1)
Sodium: 145 mmol/L (ref 135–145)

## 2021-07-28 LAB — GLUCOSE, CAPILLARY
Glucose-Capillary: 95 mg/dL (ref 70–99)
Glucose-Capillary: 74 mg/dL (ref 70–99)

## 2021-07-28 LAB — POCT I-STAT 7, (LYTES, BLD GAS, ICA,H+H)
Acid-Base Excess: 4 mmol/L — ABNORMAL HIGH (ref 0.0–2.0)
Bicarbonate: 29.8 mmol/L — ABNORMAL HIGH (ref 20.0–28.0)
Calcium, Ion: 1.23 mmol/L (ref 1.15–1.40)
HCT: 36 % (ref 36.0–46.0)
Hemoglobin: 12.2 g/dL (ref 12.0–15.0)
O2 Saturation: 92 %
Potassium: 3.7 mmol/L (ref 3.5–5.1)
Sodium: 144 mmol/L (ref 135–145)
TCO2: 31 mmol/L (ref 22–32)
pCO2 arterial: 47.7 mmHg (ref 32–48)
pH, Arterial: 7.403 (ref 7.35–7.45)
pO2, Arterial: 63 mmHg — ABNORMAL LOW (ref 83–108)

## 2021-07-28 LAB — PROTIME-INR
INR: 1.1 (ref 0.8–1.2)
Prothrombin Time: 14.2 seconds (ref 11.4–15.2)

## 2021-07-28 SURGERY — RIGHT/LEFT HEART CATH AND CORONARY/GRAFT ANGIOGRAPHY
Anesthesia: LOCAL

## 2021-07-28 MED ORDER — SODIUM CHLORIDE 0.9% FLUSH: 3.0000 mL | Freq: Two times a day (BID) | INTRAVENOUS | Status: DC

## 2021-07-28 MED ORDER — SODIUM CHLORIDE 0.9 % IV SOLN: INTRAVENOUS | Status: DC

## 2021-07-28 MED ORDER — ASPIRIN 81 MG PO CHEW
81.0000 mg | CHEWABLE_TABLET | ORAL | Status: AC
  Administered 2021-07-28: 81 mg via ORAL
  Filled 2021-07-28: qty 1

## 2021-07-28 MED ORDER — SODIUM CHLORIDE 0.9% FLUSH: 3.0000 mL | INTRAVENOUS | Status: DC | PRN

## 2021-07-28 MED ORDER — HEPARIN (PORCINE) IN NACL 1000-0.9 UT/500ML-% IV SOLN
INTRAVENOUS | Status: AC
  Filled 2021-07-28: qty 1000

## 2021-07-28 MED ORDER — FENTANYL CITRATE (PF) 100 MCG/2ML IJ SOLN
INTRAMUSCULAR | Status: AC
  Filled 2021-07-28: qty 2

## 2021-07-28 MED ORDER — SODIUM CHLORIDE 0.9 % IV SOLN: 250.0000 mL | INTRAVENOUS | Status: DC | PRN

## 2021-07-28 MED ORDER — MIDAZOLAM HCL 2 MG/2ML IJ SOLN
INTRAMUSCULAR | Status: DC | PRN
  Administered 2021-07-28: 1 mg via INTRAVENOUS

## 2021-07-28 MED ORDER — ONDANSETRON HCL 4 MG/2ML IJ SOLN: 4.0000 mg | Freq: Four times a day (QID) | INTRAMUSCULAR | Status: DC | PRN

## 2021-07-28 MED ORDER — HYDRALAZINE HCL 20 MG/ML IJ SOLN: 10.0000 mg | INTRAMUSCULAR | Status: DC | PRN

## 2021-07-28 MED ORDER — LIDOCAINE HCL (PF) 1 % IJ SOLN
INTRAMUSCULAR | Status: AC
  Filled 2021-07-28: qty 30

## 2021-07-28 MED ORDER — MIDAZOLAM HCL 2 MG/2ML IJ SOLN
INTRAMUSCULAR | Status: AC
  Filled 2021-07-28: qty 2

## 2021-07-28 MED ORDER — LABETALOL HCL 5 MG/ML IV SOLN: 10.0000 mg | INTRAVENOUS | Status: DC | PRN

## 2021-07-28 MED ORDER — ASPIRIN 81 MG PO CHEW: 81.0000 mg | CHEWABLE_TABLET | Freq: Every day | ORAL | Status: DC

## 2021-07-28 MED ORDER — LIDOCAINE HCL (PF) 1 % IJ SOLN
INTRAMUSCULAR | Status: DC | PRN
  Administered 2021-07-28: 5 mL

## 2021-07-28 MED ORDER — IOHEXOL 350 MG/ML SOLN
INTRAVENOUS | Status: DC | PRN
  Administered 2021-07-28: 100 mL

## 2021-07-28 MED ORDER — FENTANYL CITRATE (PF) 100 MCG/2ML IJ SOLN
INTRAMUSCULAR | Status: DC | PRN
  Administered 2021-07-28: 25 ug via INTRAVENOUS

## 2021-07-28 MED ORDER — ACETAMINOPHEN 325 MG PO TABS: 650.0000 mg | ORAL_TABLET | ORAL | Status: DC | PRN

## 2021-07-28 MED ORDER — DIAZEPAM 2 MG PO TABS: 2.0000 mg | ORAL_TABLET | Freq: Four times a day (QID) | ORAL | Status: DC | PRN

## 2021-07-28 MED ORDER — HEPARIN (PORCINE) IN NACL 1000-0.9 UT/500ML-% IV SOLN
INTRAVENOUS | Status: DC | PRN
  Administered 2021-07-28 (×2): 500 mL

## 2021-07-28 SURGICAL SUPPLY — 15 items
CATH INFINITI 5 FR IM (CATHETERS) ×1 IMPLANT
CATH INFINITI 5FR MULTPACK ANG (CATHETERS) ×1 IMPLANT
CATH SWAN GANZ 7F STRAIGHT (CATHETERS) ×1 IMPLANT
CLOSURE MYNX CONTROL 5F (Vascular Products) ×1 IMPLANT
ELECT DEFIB PAD ADLT CADENCE (PAD) ×1 IMPLANT
GUIDEWIRE .025 260CM (WIRE) ×1 IMPLANT
KIT HEART LEFT (KITS) ×3 IMPLANT
PACK CARDIAC CATHETERIZATION (CUSTOM PROCEDURE TRAY) ×3 IMPLANT
SHEATH PINNACLE 5F 10CM (SHEATH) ×1 IMPLANT
SHEATH PINNACLE 7F 10CM (SHEATH) ×1 IMPLANT
SYR MEDRAD MARK 7 150ML (SYRINGE) ×3 IMPLANT
TRANSDUCER W/STOPCOCK (MISCELLANEOUS) ×3 IMPLANT
TUBING CIL FLEX 10 FLL-RA (TUBING) ×3 IMPLANT
WIRE EMERALD 3MM-J .035X150CM (WIRE) ×1 IMPLANT
WIRE EMERALD 3MM-J .035X260CM (WIRE) ×1 IMPLANT

## 2021-07-28 NOTE — Progress Notes (Signed)
Up and walked and tolerated well; right groin stable, no bleeding or hematoma 

## 2021-07-28 NOTE — Interval H&P Note (Signed)
Cath Lab Visit (complete for each Cath Lab visit)  Clinical Evaluation Leading to the Procedure:   ACS: No.  Non-ACS:    Anginal Classification: CCS III  Anti-ischemic medical therapy: Maximal Therapy (2 or more classes of medications)  Non-Invasive Test Results: No non-invasive testing performed  Prior CABG: Previous CABG      History and Physical Interval Note:  07/28/2021 10:10 AM  [name]  has presented today for surgery, with the diagnosis of chest pain, shortness of breath.  The various methods of treatment have been discussed with the patient and family. After consideration of risks, benefits and other options for treatment, the patient has consented to  Procedure(s): RIGHT/LEFT HEART CATH AND CORONARY/GRAFT ANGIOGRAPHY (N/A) as a surgical intervention.  The patient's history has been reviewed, patient examined, no change in status, stable for surgery.  I have reviewed the patient's chart and labs.  Questions were answered to the patient's satisfaction.     Shelva Majestic

## 2021-07-29 ENCOUNTER — Encounter (HOSPITAL_COMMUNITY): Payer: Self-pay | Admitting: Cardiovascular Disease

## 2021-07-31 ENCOUNTER — Telehealth: Payer: Self-pay | Admitting: Internal Medicine

## 2021-08-05 ENCOUNTER — Telehealth: Payer: Self-pay | Admitting: Internal Medicine

## 2021-08-05 NOTE — Telephone Encounter (Signed)
Pt.requesting refill be sent to  Continuous Blood Gluc Sensor ([place]) MISC        [facility] [address] [place] Phone [phone]

## 2021-08-07 MED ORDER — FREESTYLE LIBRE 14 DAY SENSOR MISC
3 refills | Status: DC
Start: 2021-08-07 — End: 2021-12-18

## 2021-08-12 ENCOUNTER — Telehealth: Payer: Self-pay | Admitting: Internal Medicine

## 2021-08-12 ENCOUNTER — Other Ambulatory Visit: Payer: Self-pay | Admitting: Internal Medicine

## 2021-08-12 NOTE — Progress Notes (Unsigned)
Lab Results  Component Value Date   WBC 7.1 07/28/2021   HGB 12.2 07/28/2021   HCT 36.0 07/28/2021   PLT 406 (H) 07/28/2021   GLUCOSE 94 07/28/2021   CHOL 108 10/21/2020   TRIG 125 10/21/2020   HDL 27 (L) 10/21/2020   LDLCALC 58 10/21/2020   ALT 13 03/16/2021   AST 17 03/16/2021   NA 144 07/28/2021   K 3.7 07/28/2021   CL 105 07/28/2021   CREATININE 0.71 07/28/2021   BUN 18 07/28/2021   CO2 31 07/28/2021   TSH 1.13 03/16/2021   INR 1.1 07/28/2021   HGBA1C 9.5 (H) 03/16/2021   MICROALBUR 1.6 03/16/2021

## 2021-08-12 NOTE — Telephone Encounter (Signed)
Message left for patient to return call to clinic.  If she calls back please see what she needs.

## 2021-08-12 NOTE — Telephone Encounter (Signed)
Pt called in and is requesting a call back from someone.   States she has some questions and is in a "dilemma".   I asked pt if she had specific questions regarding medications, diagnosis, etc.   States in regards to her diabetes.   Please call to follow up ASAP.

## 2021-08-13 ENCOUNTER — Telehealth: Payer: Self-pay | Admitting: Internal Medicine

## 2021-08-13 DIAGNOSIS — E1042 Type 1 diabetes mellitus with diabetic polyneuropathy: Secondary | ICD-10-CM

## 2021-08-13 NOTE — Telephone Encounter (Signed)
Spoke with patient today.  She will increase up to 20 and call me back tomorrow to let me  know what her glucose is reading.

## 2021-08-13 NOTE — Telephone Encounter (Signed)
Pt called and stated she was referred to an endocrinologist and they aren't able to see her until 03/01/22. She is requesting a callback from the nurse.   Please advise  CB:[phone]

## 2021-08-18 NOTE — Progress Notes (Unsigned)
NEUROLOGY FOLLOW UP OFFICE NOTE  [name] [id number]  Assessment/Plan:   Dizziness Headache OSA   I believe that her dizziness and headaches are due to untreated obstructive sleep apnea.  I will refer her to a new sleep specialist.  She would like to remain on the nortriptyline for now as it helps her sleep.  Follow up 4 months.   Subjective:  [name] is a 81 year old woman with coronary artery disease, hypertension, type 2 diabetes mellitus, hyperlipidemia, B12 deficiency and obstructive sleep apnea and tension-type headache who follows up for dizziness and headache.   UPDATE: Referred to sleep medicine for re-evaluation and treatment of OSA.  Declined repeat sleep study.   Still feels dizzy.  It comes and goes.  It is a lightheadedness and things look like that they are moving when they are not.  Lasts 20 minutes.  Not necessarily positional.  Usually will occur for a week and occurs every 2 weeks.   Intensity:  Moderate to severe Duration:  Wakes up at 5 AM with headache.  Takes Tylenol and resolves by 11 AM.  However, it returns in the evening. Frequency:  They are now daily again. Frequency of abortive medication: Tylenol every 4 hours daily Current NSAIDS:  ASA 40m Current analgesics:  Tylenol Current triptans:  no Current ergotamine:  no Current anti-emetic:  Promethazine 265mCurrent muscle relaxants:  Baclofen 4037mwice daily Current anti-anxiolytic:  Klonopin Current sleep aide:  trazodone Current Antihypertensive medications:  Toprol XL 47m42mardizem, Lasix, Imdur Current Antidepressant medications:  Nortriptyline 100 mg Current Anticonvulsant medications:  no Current anti-CGRP:  no Current Vitamins/Herbal/Supplements:  B12 Current Antihistamines/Decongestants:  Allegra, Singulair Other therapy:  ice pack   Caffeine:  1 cup coffee daily, tea Alcohol:  no Smoker:  no Diet:  hydrates Exercise:  yes Depression:  no; Anxiety:  no Other pain:   no Sleep hygiene:  Poor.  Untreated OSA.   She was taken off of CPAP by her prior sleep specialist because of reported noncompliance.  However, [name] insists she wore the mask all night and headaches were improved while she used the machine.     HISTORY: Onset:  She has history of migraines as a young woman which resolved after having children.  Current headaches started in 2016. Location:  She has constant holocephalic headache.  She has severe headache radiating from the base of her neck on the right up Quality:  pounding Initial Intensity:  10/10 Aura:  no Prodrome:  no Postdrome:  no Associated symptoms:  None.  No nausea, vomiting, photophobia, phonophobia or visual disturbance.  She has not had any new worse headache of her life, waking up from sleep Initial Duration:  All day Initial Frequency:  Daily headache, severe  Headache every other day Initial Frequency of abortive medication: daily Triggers:  None Relieving factors:  None Activity:  aggravates   Past NSAIDS:  Ibuprofen, naproxen (caused increased heart rate) Past analgesics:  Excedrin, tramadol and other opioids (adverse reaction) Past abortive triptans:  no Past muscle relaxants:  Robaxin, Flexeril Past anti-emetic:  Promethazine (effective) Past antihypertensive medications:  losartan Past antidepressant medications:  no Past anticonvulsant medications:  Gabapentin 200mg23mowsiness) Past vitamins/Herbal/Supplements:  CoQ10 Past antihistamines/decongestants:  no Other past therapy:  Physical therapy of neck.   Family history of headache:  father  History of vertigo.  Seen in ED in July 2022.  MRI of brain revealed empty sella but otherwise no acute intracranial abnormality.  Occurs while  walking or standing.  Not associated with quick head movements or turning over in bed.  Went to vestibular rehab which was ineffective.    MRI of brain without contrast from 09/16/14 revealed partially empty sella but  otherwise unremarkable.   MRI of cervical spine from 09/16/14 demonstrated multilevel degenerative changes including left greater than right spurring at C3-C4 causing left foraminal narrowing, severe spurring at C4-C5 causing right moderate foraminal narrowing, and disc bulg and spurring at C6-C7 with moderate bilateral foraminal narrowing.  She was evaluated by Dr. Nelva Bush for interventional pain (epidural or facet joint injections).  He didn't think it was a good idea given that she would have to stop Plavix.  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     albuterol (PROVENTIL HFA;VENTOLIN HFA) 108 (90 BASE) MCG/ACT inhaler Inhale 2 puffs into the lungs every 6 (six) hours as needed. For shortness of breath. 1 Inhaler 0   albuterol (PROVENTIL) (2.5 MG/3ML) 0.083% nebulizer solution USE 1 VIAL IN NEBULIZER 4 TIMES DAILY (Patient taking differently: Take 2.5 mg by nebulization every 6 (six) hours as needed for wheezing or  shortness of breath.) 360 mL 1   alum & mag hydroxide-simeth (MAALOX/MYLANTA) 200-200-20 MG/5ML suspension Take 30 mLs as needed by mouth for indigestion or heartburn.     aspirin EC 81 MG tablet Take 1 tablet (81 mg total) by mouth daily.     baclofen (LIORESAL) 20 MG tablet TAKE 2 TABLETS BY MOUTH 2 TIMES DAILY. 360 tablet 2   Bempedoic Acid-Ezetimibe (NEXLIZET) 180-10 MG TABS Take 1 tablet by mouth daily. 90 tablet 3   Blood Glucose Monitoring Suppl (ONE TOUCH ULTRA MINI) w/Device KIT 3 (three) times daily. for testing  0   calcium carbonate (OS-CAL) 1250 (500 Ca) MG chewable tablet Chew 500 mg by mouth daily as needed for heartburn.     Cholecalciferol (VITAMIN D) 50 MCG (2000 UT) tablet Take 4,000 Units by mouth daily.     Continuous Blood Gluc Sensor (FREESTYLE LIBRE 14 DAY SENSOR) MISC UAD to monitor sugars  E10.9 9 each 3   Cyanocobalamin (VITAMIN B-12 IJ) Inject 1,000 mcg as directed every 30 (thirty) days.     diazepam (VALIUM) 5 MG tablet Take 1 tablet (5 mg  total) by mouth daily. 30 tablet 5   diltiazem (CARDIZEM CD) 240 MG 24 hr capsule Take 1 capsule (240 mg total) by mouth daily. 90 capsule 2   Fluticasone-Umeclidin-Vilant (TRELEGY ELLIPTA) 200-62.5-25 MCG/ACT AEPB Inhale 1 puff into the lungs daily at 6 (six) AM. 1 each 0   furosemide (LASIX) 40 MG tablet Take 1 tablet (40 mg total) by mouth daily. (Patient taking differently: Take 40 mg by mouth daily as needed for edema.) 90 tablet 3   insulin lispro protamine-insulin lispro (HUMALOG 75/25) (75-25) 100 UNIT/ML SUSP Inject 15-20 Units into the skin See admin instructions. Inject 15 units breakfast and 20 units with dinner. May inject 5 units with lunch if blood sugar is high     isosorbide mononitrate (IMDUR) 60 MG 24 hr tablet Take one tablet ( 60 mg ) twice daily 8 hours apart. (Patient taking differently: Take 60 mg by mouth 2 (two) times daily. Take 8 hours apart.) 180 tablet 3   Menthol-Methyl Salicylate (MUSCLE RUB) 10-15 %  CREA Apply 1 Application topically as needed for muscle pain.     metoprolol succinate (TOPROL-XL) 100 MG 24 hr tablet TAKE 1 TABLET BY MOUTH EVERY DAY 90 tablet 3   Multiple Vitamin (MULTIVITAMIN WITH MINERALS) TABS Take 1 tablet by mouth daily.     Nebulizers (COMPRESSOR/NEBULIZER) MISC Use with albuterol 1 each 0   nitroGLYCERIN (NITROSTAT) 0.4 MG SL tablet Place 1 tablet (0.4 mg total) under the tongue every 5 (five) minutes as needed for chest pain. 25 tablet 3   nortriptyline (PAMELOR) 50 MG capsule TAKE 2 CAPSULES (100 MG TOTAL) BY MOUTH AT BEDTIME. 180 capsule 1   ONE TOUCH ULTRA TEST test strip 1 each by Other route 2 (two) times daily.  3   ONETOUCH DELICA LANCETS 94R MISC 3 (three) times daily. for testing  0   Spacer/Aero-Holding Chambers (AEROCHAMBER MV) inhaler Use as instructed 1 each 0   spironolactone (ALDACTONE) 25 MG tablet Take 1 tablet (25 mg total) by mouth daily. 90 tablet 3   Study - ORION 4 - inclisiran 300 mg/1.32m or placebo SQ injection (PI-Stuckey) Inject 300 mg into the skin every 6 (six) months.     Current Facility-Administered Medications on File Prior to Visit  Medication Dose Route Frequency Provider Last Rate Last Admin   cyanocobalamin ((VITAMIN B-12)) injection 1,000 mcg  1,000 mcg Intramuscular Q30 days BBinnie Rail MD   1,000 mcg at 03/26/16 1658    ALLERGIES: Allergies  Allergen Reactions   Amlodipine Other (See Comments)    hallucinations    Banana Nausea And Vomiting    Stomach pumped   Co Q10 [Coenzyme Q10] Other (See Comments)    Body cramps   Codeine Other (See Comments)    Hallucinate, loose identity and don't know who I am   Insulin Aspart     Other reaction(s): Unknown   Lisinopril     nose bleed   Metformin And Related Nausea And Vomiting   Morphine Other (See Comments)    Can not function, it immobilizes me    Pentazocine Nausea And Vomiting   Pravastatin Other (See Comments)    Hands locked up   Repatha [Evolocumab] Other  (See Comments)    myalgias   Statins Other (See Comments)    Muscle cramps   Sulfa Antibiotics Swelling   Sulfonamide Derivatives Swelling   Tramadol Hcl Other (See Comments)    Dizziness, cant function     FAMILY HISTORY: Family History  Problem Relation Age of Onset   Heart disease Maternal Grandfather    Heart failure Maternal Grandfather    Diabetes Maternal Grandfather    Heart attack Father    Diabetes Mother    Rheum arthritis Sister    Emphysema Paternal Uncle    Esophageal cancer Brother 23       she said he was born with it   Emphysema Paternal Aunt    Healthy Child    Neuropathy Neg Hx    Multiple sclerosis Neg Hx    Colon cancer Neg Hx    Colon polyps Neg Hx    Rectal cancer Neg Hx    Stomach cancer Neg Hx       Objective:  *** General: No acute distress.  Patient appears well-groomed.   Head:  Normocephalic/atraumatic Eyes:  Fundi examined but not visualized Neck: supple, no paraspinal tenderness, full range of motion Heart:  Regular rate and rhythm Neurological Exam: alert and oriented to person, place, and time.  Speech fluent and not dysarthric, language intact.  CN II-XII intact. Bulk and tone normal, muscle strength 5/5 throughout.  Sensation to light touch intact.  Deep tendon reflexes 2+ throughout, toes downgoing.  Finger to nose testing intact.  Gait normal, Romberg negative.   Metta Clines, DO  CC: Billey Gosling, MD

## 2021-08-19 ENCOUNTER — Encounter: Payer: Self-pay | Admitting: Neurology

## 2021-08-19 ENCOUNTER — Telehealth (HOSPITAL_COMMUNITY): Payer: Self-pay | Admitting: Pharmacy Technician

## 2021-08-19 ENCOUNTER — Ambulatory Visit (INDEPENDENT_AMBULATORY_CARE_PROVIDER_SITE_OTHER): Payer: Medicare Other | Admitting: Neurology

## 2021-08-19 VITALS — BP 138/75 | HR 70 | Ht 62.0 in | Wt 184.0 lb

## 2021-08-19 DIAGNOSIS — R519 Headache, unspecified: Secondary | ICD-10-CM | POA: Diagnosis not present

## 2021-08-19 DIAGNOSIS — R42 Dizziness and giddiness: Secondary | ICD-10-CM

## 2021-08-19 DIAGNOSIS — G4733 Obstructive sleep apnea (adult) (pediatric): Secondary | ICD-10-CM | POA: Diagnosis not present

## 2021-08-19 MED ORDER — TOPIRAMATE 25 MG PO TABS: 25.0000 mg | ORAL_TABLET | Freq: Every day | ORAL | 5 refills | Status: DC

## 2021-08-19 MED ORDER — NORTRIPTYLINE HCL 50 MG PO CAPS: 100.0000 mg | ORAL_CAPSULE | Freq: Every day | ORAL | 1 refills | Status: DC

## 2021-08-19 NOTE — Patient Instructions (Signed)
Contact [facility] to say you would like to get a sleep study Start topiramate '25mg'$  at bedtime.  If no improvement in 6-8 weeks, contact me.  Stay hydated. Refilled nortriptyline '100mg'$  at bedtime Follow up 4 months.

## 2021-08-19 NOTE — Telephone Encounter (Signed)
Patient Advocate Encounter   Received notification that prior authorization for Nortriptyline HCl '50MG'$  capsules is required.   PA submitted on 08/19/2021 Key [id number] Status is pending       Lyndel Safe, [place] Patient Advocate Specialist [place] Patient Advocate Team Direct Number: [phone]  Fax: [phone]

## 2021-08-19 NOTE — Telephone Encounter (Signed)
Patient Advocate Encounter  Prior Authorization for Nortriptyline HCl '50MG'$  capsules has been approved.    PA# [id number] Effective dates: 08/19/2021 through 02/07/2022     Lyndel Safe, [place] Patient Advocate Specialist [place] Patient Advocate Team Direct Number: [phone]  Fax: [phone]

## 2021-08-21 NOTE — Telephone Encounter (Signed)
New referral ordered for [place]

## 2021-08-21 NOTE — Telephone Encounter (Signed)
Left message for patient today with info regarding new referral.

## 2021-08-26 ENCOUNTER — Other Ambulatory Visit: Payer: Self-pay | Admitting: Physician Assistant

## 2021-08-26 MED ORDER — NEXLIZET 180-10 MG PO TABS: 1.0000 | ORAL_TABLET | Freq: Every day | ORAL | 3 refills | Status: DC

## 2021-09-14 ENCOUNTER — Other Ambulatory Visit: Payer: Self-pay | Admitting: Physician Assistant

## 2021-09-15 ENCOUNTER — Ambulatory Visit: Payer: Medicare Other | Admitting: Internal Medicine

## 2021-09-18 ENCOUNTER — Ambulatory Visit (INDEPENDENT_AMBULATORY_CARE_PROVIDER_SITE_OTHER): Payer: Medicare Other | Admitting: Family Medicine

## 2021-09-18 ENCOUNTER — Encounter: Payer: Self-pay | Admitting: Family Medicine

## 2021-09-18 VITALS — BP 136/76 | HR 80 | Temp 97.6°F | Ht 62.0 in

## 2021-09-18 DIAGNOSIS — G4709 Other insomnia: Secondary | ICD-10-CM | POA: Diagnosis not present

## 2021-09-18 DIAGNOSIS — R7309 Other abnormal glucose: Secondary | ICD-10-CM | POA: Diagnosis not present

## 2021-09-18 DIAGNOSIS — G4733 Obstructive sleep apnea (adult) (pediatric): Secondary | ICD-10-CM

## 2021-09-18 DIAGNOSIS — E1059 Type 1 diabetes mellitus with other circulatory complications: Secondary | ICD-10-CM

## 2021-09-18 NOTE — Patient Instructions (Signed)
  [facility] phone number is [phone] Please call to ask about scheduling a sleep study   You may try over the counter melatonin 3 mg approximately 30-60 minutes before you want to sleep.   Follow up with Dr. Quay Burow as scheduled as well as your specialists.

## 2021-09-18 NOTE — Assessment & Plan Note (Signed)
Recent blood sugars have been in the 200 range and as low as 60.  States her endocrinologist will no longer see her.  Reports using Humalog (75/25) 20 units with morning meal and evening meal.  States she has been able to get her blood sugars out of the 300 range.  She will closely monitor blood sugars at home and follow-up with her PCP, Dr. Quay Burow in the next 2 to 3 weeks.

## 2021-09-18 NOTE — Assessment & Plan Note (Addendum)
She plans to call [facility] pulmonology and request a new sleep study in order to get a CPAP.  Dr. Tomi Likens also recommended this. Notes reviewed from [facility] pulmonologist, neurologist and PCP.

## 2021-09-18 NOTE — Progress Notes (Signed)
Subjective:     Patient ID: [name], female    DOB: [birth year], 81 y.o.   MRN: [id number]  Chief Complaint  Patient presents with   Insomnia    Unable to sleep for the last 2 months. States she has been trying to work on her blood sugars herself and they are ranging in the 200s and its causing her to not feel good. States she was recently dropped from her endocrinologist and is just really struggling with keeping diabetes under control.     Insomnia   Patient is in today for insomnia. States she is not sleeping much. Requests medication to help her sleep.  States she stopped nortriptyline on her own.  States it made her feel dizzy.    Dr. Veverly Fells is her orthopedist and states he gave her a pill to get sleep but only one dose and she does not know the name of it.  States she is having a left shoulder replacement soon.   States she her blood sugars became uncontrolled 2 months ago. FBS in the 200 range and lows in the 60s.   Dr. Chalmers Cater was her endocrinologist but states "she dropped me". States she has been to all of the endocrinologists in town and they won't take her.   Reports taking 20 units of Humalog 75/25 in the morning and evenings with meals.   She saw neuro in July and they started her on topiramate at bedtime. Reports taking it. They also refilled her nortriptyline at 100 mg but states she is not taking it.   OSA and is not on CPAP. States "they took it from me".   She saw Dr Halford Chessman at [facility] in 2020. Dr. Tomi Likens advised her to call them to schedule a new sleep test. Thinks her headaches and fatigue is related to untreated sleep apnea.   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 She drove here today.   Denies fever, chills, chest pain, palpitations, shortness of breath, abdominal pain, N/V/D    Health Maintenance Due  Topic Date Due   Zoster Vaccines- Shingrix (1 of 2) Never done   DEXA SCAN  Never done   FOOT EXAM  01/25/2019   OPHTHALMOLOGY EXAM  02/06/2020    COVID-19 Vaccine (4 - Pfizer series) 02/20/2021   INFLUENZA VACCINE  09/08/2021   HEMOGLOBIN A1C  09/13/2021    Past Medical History:  Diagnosis Date   Adenomatous colon polyp    Allergy    Anxiety    Asthma    Carotid artery disease (Cataract)    carotid US 02/2017: bilat ICA 1-39%   Chronic diastolic CHF    Echo 08/8467: EF 65-70, Gr 2 DD, mild MS (mean 5), PASP 44   COPD    pt is unsure if has been officially diagnosed   Coronary artery disease    CABG '09- cathed 12/09, 9/10, 6/11, 3/14 and 12/13/16- medical Rx // cath 12/2016 - 2/3 grafts patent >> med Rx // Myoview 12/17: low risk   Diabetes mellitus    Gastroesophageal reflux disease    Hiatal hernia    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   Hyperlipidemia    Hypertension    PONV (postoperative nausea and vomiting)    Schatzki's ring    Shoulder injury    resolved after shoulder surgery   Sleep apnea    not on cpap    Past Surgical History:  Procedure Laterality Date   ABDOMINAL HYSTERECTOMY  APPENDECTOMY     came out with Hysterectomy   CARDIAC CATHETERIZATION  07/23/2009   EF 60%   CARDIAC CATHETERIZATION  10/11/2008   CARDIAC CATHETERIZATION  03/01/2007   EF 75-80%   CARDIOVASCULAR STRESS TEST  11/15/2007   EF 60%   COLONOSCOPY     CORONARY ARTERY BYPASS GRAFT     SEVERELY DISEASED SAPHENOUS VEIN GRAFT TO THE RIGHT CORONARY ARTERY BUT WITH FAIRLY WELL PRESERVED FLOW TO THE DISTAL RIGHT CORONARY ARTERY FROM THE NATIVE CIRCULATION-RESTART  CATH IN JUNE 2000, REVEALS MILD/MODERATE  CAD WITH GOOD FLOW DOWN HER LAD   ESOPHAGOGASTRODUODENOSCOPY     EYE SURGERY     bilateral cataract surgery with lens implant   LEFT HEART CATH AND CORS/GRAFTS ANGIOGRAPHY N/A 12/14/2016   Procedure: LEFT HEART CATH AND CORS/GRAFTS ANGIOGRAPHY;  Surgeon: Jettie Booze, MD;  Location: [facility];  Service: Cardiovascular;  Laterality: N/A;   lense removal Left    POLYPECTOMY     RIGHT/LEFT HEART CATH AND  CORONARY/GRAFT ANGIOGRAPHY N/A 07/28/2021   Procedure: RIGHT/LEFT HEART CATH AND CORONARY/GRAFT ANGIOGRAPHY;  Surgeon: Troy Sine, MD;  Location: [facility];  Service: Cardiovascular;  Laterality: N/A;   ROTATOR CUFF REPAIR     right and left   TONSILLECTOMY     age 44   TOTAL KNEE ARTHROPLASTY Right 02/20/2014   Procedure: RIGHT TOTAL KNEE ARTHROPLASTY;  Surgeon: Tobi Bastos, MD;  Location: [facility];  Service: Orthopedics;  Laterality: Right;   tumor removed kidney     UPPER GASTROINTESTINAL ENDOSCOPY     US ECHOCARDIOGRAPHY  03/08/2008   EF 55-60%    Family History  Problem Relation Age of Onset   Heart disease Maternal Grandfather    Heart failure Maternal Grandfather    Diabetes Maternal Grandfather    Heart attack Father    Diabetes Mother    Rheum arthritis Sister    Emphysema Paternal Uncle    Esophageal cancer Brother 60       she said he was born with it   Emphysema Paternal Aunt    Healthy Child    Neuropathy Neg Hx    Multiple sclerosis Neg Hx    Colon cancer Neg Hx    Colon polyps Neg Hx    Rectal cancer Neg Hx    Stomach cancer Neg Hx     Social History   Socioeconomic History   Marital status: Widowed    Spouse name: Not on file   Number of children: 4   Years of education: Doctorate   Highest education level: Doctorate  Occupational History   Occupation: Retired  Tobacco Use   Smoking status: Former    Packs/day: 0.50    Years: 21.00    Total pack years: 10.50    Types: Cigarettes    Start date: 02/09/1955    Quit date: 02/09/1976    Years since quitting: 45.6   Smokeless tobacco: Never  Vaping Use   Vaping Use: Never used  Substance and Sexual Activity   Alcohol use: No    Alcohol/week: 0.0 standard drinks of alcohol   Drug use: No   Sexual activity: Never  Other Topics Concern   Not on file  Social History Narrative   Lives alone.  One story home.  Has 4 children.  Education: doctorate in theology.    Caffeine use: Drinks 1  cup coffee/day      Originally from [place]. Previously has lived in [place]. Prior  travel to [place], [place], [place], [place], [place], [place], [place], & [place]. Previously worked in [employer] She has a dog currently. No bird, mold, or hot tub exposure. She also pastors a church.    Right Handed   Social Determinants of Health   Financial Resource Strain: Low Risk  (09/29/2020)   Overall Financial Resource Strain (CARDIA)    Difficulty of Paying Living Expenses: Not hard at all  Food Insecurity: No Food Insecurity (09/29/2020)   Hunger Vital Sign    Worried About Running Out of Food in the Last Year: Never true    Ran Out of Food in the Last Year: Never true  Transportation Needs: No Transportation Needs (09/29/2020)   PRAPARE - Hydrologist (Medical): No    Lack of Transportation (Non-Medical): No  Physical Activity: Inactive (09/29/2020)   Exercise Vital Sign    Days of Exercise per Week: 0 days    Minutes of Exercise per Session: 0 min  Stress: No Stress Concern Present (09/29/2020)   [place]    Feeling of Stress : Not at all  Social Connections: Moderately Integrated (09/29/2020)   Social Connection and Isolation Panel [NHANES]    Frequency of Communication with Friends and Family: More than three times a week    Frequency of Social Gatherings with Friends and Family: More than three times a week    Attends Religious Services: More than 4 times per year    Active Member of [employer] or Organizations: Yes    Attends Archivist Meetings: More than 4 times per year    Marital Status: Widowed  Intimate Partner Violence: Not At Risk (09/29/2020)   Humiliation, Afraid, Rape, and Kick questionnaire    Fear of Current or Ex-Partner: No    Emotionally Abused: No    Physically Abused: No    Sexually Abused: No    Outpatient Medications Prior to Visit  Medication Sig Dispense Refill    acetaminophen (TYLENOL) 325 MG tablet Take 650 mg by mouth every 6 (six) hours as needed for moderate pain or headache.     albuterol (PROVENTIL HFA;VENTOLIN HFA) 108 (90 BASE) MCG/ACT inhaler Inhale 2 puffs into the lungs every 6 (six) hours as needed. For shortness of breath. 1 Inhaler 0   albuterol (PROVENTIL) (2.5 MG/3ML) 0.083% nebulizer solution USE 1 VIAL IN NEBULIZER 4 TIMES DAILY (Patient taking differently: Take 2.5 mg by nebulization every 6 (six) hours as needed for wheezing or shortness of breath.) 360 mL 1   alum & mag hydroxide-simeth (MAALOX/MYLANTA) 200-200-20 MG/5ML suspension Take 30 mLs as needed by mouth for indigestion or heartburn.     aspirin EC 81 MG tablet Take 1 tablet (81 mg total) by mouth daily.     baclofen (LIORESAL) 20 MG tablet TAKE 2 TABLETS BY MOUTH 2 TIMES DAILY. 360 tablet 2   Bempedoic Acid-Ezetimibe (NEXLIZET) 180-10 MG TABS Take 1 tablet by mouth daily. 90 tablet 3   Blood Glucose Monitoring Suppl (ONE TOUCH ULTRA MINI) w/Device KIT 3 (three) times daily. for testing  0   calcium carbonate (OS-CAL) 1250 (500 Ca) MG chewable tablet Chew 500 mg by mouth daily as needed for heartburn.     Cholecalciferol (VITAMIN D) 50 MCG (2000 UT) tablet Take 4,000 Units by mouth daily.     Continuous Blood Gluc Sensor (FREESTYLE LIBRE 14 DAY SENSOR) MISC UAD to monitor sugars  E10.9 9 each 3  Cyanocobalamin (VITAMIN B-12 IJ) Inject 1,000 mcg as directed every 30 (thirty) days.     diazepam (VALIUM) 5 MG tablet Take 1 tablet (5 mg total) by mouth daily. 30 tablet 5   diltiazem (CARDIZEM CD) 240 MG 24 hr capsule Take 1 capsule (240 mg total) by mouth daily. 90 capsule 2   Fluticasone-Umeclidin-Vilant (TRELEGY ELLIPTA) 200-62.5-25 MCG/ACT AEPB Inhale 1 puff into the lungs daily at 6 (six) AM. (Patient taking differently: Inhale 1 puff into the lungs daily at 6 (six) AM. As needed) 1 each 0   furosemide (LASIX) 40 MG tablet Take 1 tablet (40 mg total) by mouth daily. (Patient  taking differently: Take 40 mg by mouth daily as needed for edema.) 90 tablet 3   insulin lispro protamine-insulin lispro (HUMALOG 75/25) (75-25) 100 UNIT/ML SUSP Inject 15-20 Units into the skin See admin instructions. Inject 15 units breakfast and 20 units with dinner. May inject 5 units with lunch if blood sugar is high     isosorbide mononitrate (IMDUR) 60 MG 24 hr tablet Take one tablet ( 60 mg ) twice daily 8 hours apart. 180 tablet 3   Menthol-Methyl Salicylate (MUSCLE RUB) 10-15 % CREA Apply 1 Application topically as needed for muscle pain.     metoprolol succinate (TOPROL-XL) 100 MG 24 hr tablet TAKE 1 TABLET BY MOUTH EVERY DAY 90 tablet 3   Multiple Vitamin (MULTIVITAMIN WITH MINERALS) TABS Take 1 tablet by mouth daily.     Nebulizers (COMPRESSOR/NEBULIZER) MISC Use with albuterol 1 each 0   nitroGLYCERIN (NITROSTAT) 0.4 MG SL tablet Place 1 tablet (0.4 mg total) under the tongue every 5 (five) minutes as needed for chest pain. 25 tablet 3   ONE TOUCH ULTRA TEST test strip 1 each by Other route 2 (two) times daily.  3   ONETOUCH DELICA LANCETS 56E MISC 3 (three) times daily. for testing  0   Spacer/Aero-Holding Chambers (AEROCHAMBER MV) inhaler Use as instructed 1 each 0   spironolactone (ALDACTONE) 25 MG tablet TAKE 1 TABLET BY MOUTH EVERY DAY 90 tablet 3   Study - ORION 4 - inclisiran 300 mg/1.72m or placebo SQ injection (PI-Stuckey) Inject 300 mg into the skin every 6 (six) months.     topiramate (TOPAMAX) 25 MG tablet Take 1 tablet (25 mg total) by mouth at bedtime. 30 tablet 5   nortriptyline (PAMELOR) 50 MG capsule Take 2 capsules (100 mg total) by mouth at bedtime. (Patient not taking: Reported on 09/18/2021) 180 capsule 1   Facility-Administered Medications Prior to Visit  Medication Dose Route Frequency Provider Last Rate Last Admin   cyanocobalamin ((VITAMIN B-12)) injection 1,000 mcg  1,000 mcg Intramuscular Q30 days BBinnie Rail MD   1,000 mcg at 03/26/16 1658     Allergies  Allergen Reactions   Amlodipine Other (See Comments)    hallucinations    Banana Nausea And Vomiting    Stomach pumped   Co Q10 [Coenzyme Q10] Other (See Comments)    Body cramps   Codeine Other (See Comments)    Hallucinate, loose identity and don't know who I am   Insulin Aspart     Other reaction(s): Unknown   Lisinopril     nose bleed   Metformin And Related Nausea And Vomiting   Morphine Other (See Comments)    Can not function, it immobilizes me    Pentazocine Nausea And Vomiting   Pravastatin Other (See Comments)    Hands locked up   Repatha [Evolocumab] Other (See  Comments)    myalgias   Statins Other (See Comments)    Muscle cramps   Sulfa Antibiotics Swelling   Sulfonamide Derivatives Swelling   Tramadol Hcl Other (See Comments)    Dizziness, cant function     Review of Systems  Psychiatric/Behavioral:  The patient has insomnia.        Objective:    Physical Exam Constitutional:      General: She is not in acute distress.    Appearance: She is not ill-appearing.  HENT:     Mouth/Throat:     Mouth: Mucous membranes are moist.  Cardiovascular:     Rate and Rhythm: Normal rate.  Pulmonary:     Effort: Pulmonary effort is normal.  Neurological:     General: No focal deficit present.     Mental Status: She is alert and oriented to person, place, and time.     Gait: Gait normal.  Psychiatric:        Mood and Affect: Mood normal.        Behavior: Behavior normal.     BP 136/76 (BP Location: Right Arm, Patient Position: Sitting, Cuff Size: Large)   Pulse 80   Temp 97.6 F (36.4 C) (Temporal)   Ht '5\' 2"'  (1.575 m)   LMP  (LMP Unknown)   BMI 33.65 kg/m  Wt Readings from Last 3 Encounters:  08/19/21 184 lb (83.5 kg)  07/28/21 181 lb (82.1 kg)  07/23/21 181 lb (82.1 kg)       Assessment & Plan:   Problem List Items Addressed This Visit       Respiratory   OSA (obstructive sleep apnea) with hypoxia    She plans to call  [facility] and request a new sleep study in order to get a CPAP.  Dr. Tomi Likens also recommended this. Notes reviewed from [facility] pulmonologist, neurologist and PCP.         Endocrine   Type 1 diabetes mellitus (HCC) (Chronic)    Recent blood sugars have been in the 200 range and as low as 60.  States her endocrinologist will no longer see her.  Reports using Humalog (75/25) 20 units with morning meal and evening meal.  States she has been able to get her blood sugars out of the 300 range.  She will closely monitor blood sugars at home and follow-up with her PCP, Dr. Quay Burow in the next 2 to 3 weeks.        Other   Insomnia - Primary    She has tried several medications in the past and most recently nortriptyline which she stopped.  Believes the medication made her feel dizzy.  Recommend melatonin 3 mg before bedtime and follow-up with PCP in the next 2 to 3 weeks. Notes reviewed from [facility] pulmonologist, neurologist and PCP.       Labile blood glucose    Patient has been adjusting her insulin to better control blood sugars.  She is seeing blood sugars in the 200 range and as low as the 60s range.  Does not have an endocrinologist at this time.  Reports being comfortable with adjusting insulin doses as she has in the past. she will keep a close eye on her blood sugar and follow-up with her PCP in the next 2 to 3 weeks.       I have discontinued Clista L. Balint's nortriptyline. I am also having her maintain her insulin lispro protamine-lispro, multivitamin with minerals, albuterol, Compressor/Nebulizer, ONE TOUCH ULTRA MINI, ONE  TOUCH ULTRA TEST, OneTouch Delica Lancets 15V, AeroChamber MV, aspirin EC, alum & mag hydroxide-simeth, furosemide, Cyanocobalamin (VITAMIN B-12 IJ), ORION 4 inclisiran or placebo, albuterol, isosorbide mononitrate, metoprolol succinate, baclofen, diazepam, Trelegy Ellipta, diltiazem, nitroGLYCERIN, Vitamin D, calcium carbonate, Muscle Rub, acetaminophen, FreeStyle  Libre 14 Day Sensor, topiramate, Nexlizet, and spironolactone. We will continue to administer cyanocobalamin.  No orders of the defined types were placed in this encounter.

## 2021-09-18 NOTE — Assessment & Plan Note (Addendum)
She has tried several medications in the past and most recently nortriptyline which she stopped.  Believes the medication made her feel dizzy.  Recommend melatonin 3 mg before bedtime and follow-up with PCP in the next 2 to 3 weeks. Notes reviewed from [facility] pulmonologist, neurologist and PCP.

## 2021-09-18 NOTE — Assessment & Plan Note (Signed)
Patient has been adjusting her insulin to better control blood sugars.  She is seeing blood sugars in the 200 range and as low as the 60s range.  Does not have an endocrinologist at this time.  Reports being comfortable with adjusting insulin doses as she has in the past. she will keep a close eye on her blood sugar and follow-up with her PCP in the next 2 to 3 weeks.

## 2021-09-23 ENCOUNTER — Telehealth: Payer: Self-pay | Admitting: *Deleted

## 2021-09-23 NOTE — Telephone Encounter (Signed)
[facility] # to call back for clarification. [phone] ext [phone] for the pharmacy

## 2021-09-23 NOTE — Telephone Encounter (Signed)
Malika calling [facility] is needing clarification on the patient Libre sensors directions for usage. Please advise

## 2021-09-24 NOTE — Telephone Encounter (Signed)
Called and spoke with [name].  They are faxing over form to be complete and OV notes being requested to support need for [facility] and sensors.   [phone] fax number [phone] phone number

## 2021-09-24 NOTE — Telephone Encounter (Signed)
It is not on her med list - it may have been a  separate form.  It should be use as directed to check sugars.

## 2021-09-30 ENCOUNTER — Ambulatory Visit: Payer: Medicare Other

## 2021-10-04 ENCOUNTER — Encounter: Payer: Self-pay | Admitting: Internal Medicine

## 2021-10-04 NOTE — Patient Instructions (Addendum)
     Blood work was ordered.     Medications changes include :      Your prescription(s) have been sent to your pharmacy.    A referral was ordered for XX.     Someone from that office will call you to schedule an appointment.    Return in about 6 months (around 04/07/2022) for follow up.

## 2021-10-04 NOTE — Progress Notes (Unsigned)
Subjective:    Patient ID: [name], female    DOB: [birth year], 81 y.o.   MRN: [id number]     HPI [name] is here for follow up of her chronic medical problems, including DM, htn, CAD, HFpEF, GERD,  B12 def, insomnia, anxiety  No longer on nortriptyline.  She has issues with sleep.  She was prescribed Restoril and is currently taking that.  What disturbs her most is the leg cramping at night.  She is off kilter - ? DM - she is not controlling it anymore - still in 200's.  She is not doing what she should be doing.  She has not yet heard from endocrine.   leg cramping at night - waking her up in the middle of the night.  Cramping in hands in the day.  No back pain.   She is drinking a lot of water.     Start ssri - denies depression Takes diazepam in the evening.      Medications and allergies reviewed with patient and updated if appropriate.  Current Outpatient Medications on File Prior to Visit  Medication Sig Dispense Refill   acetaminophen (TYLENOL) 325 MG tablet Take 650 mg by mouth every 6 (six) hours as needed for moderate pain or headache.     albuterol (PROVENTIL HFA;VENTOLIN HFA) 108 (90 BASE) MCG/ACT inhaler Inhale 2 puffs into the lungs every 6 (six) hours as needed. For shortness of breath. 1 Inhaler 0   albuterol (PROVENTIL) (2.5 MG/3ML) 0.083% nebulizer solution USE 1 VIAL IN NEBULIZER 4 TIMES DAILY (Patient taking differently: Take 2.5 mg by nebulization every 6 (six) hours as needed for wheezing or shortness of breath.) 360 mL 1   alum & mag hydroxide-simeth (MAALOX/MYLANTA) 200-200-20 MG/5ML suspension Take 30 mLs as needed by mouth for indigestion or heartburn.     aspirin EC 81 MG tablet Take 1 tablet (81 mg total) by mouth daily.     baclofen (LIORESAL) 20 MG tablet TAKE 2 TABLETS BY MOUTH 2 TIMES DAILY. 360 tablet 2   Bempedoic Acid-Ezetimibe (NEXLIZET) 180-10 MG TABS Take 1 tablet by mouth daily. 90 tablet 3   Blood Glucose Monitoring Suppl (ONE TOUCH  ULTRA MINI) w/Device KIT 3 (three) times daily. for testing  0   calcium carbonate (OS-CAL) 1250 (500 Ca) MG chewable tablet Chew 500 mg by mouth daily as needed for heartburn.     Cholecalciferol (VITAMIN D) 50 MCG (2000 UT) tablet Take 4,000 Units by mouth daily.     Continuous Blood Gluc Sensor (FREESTYLE LIBRE 14 DAY SENSOR) MISC UAD to monitor sugars  E10.9 9 each 3   Cyanocobalamin (VITAMIN B-12 IJ) Inject 1,000 mcg as directed every 30 (thirty) days.     diazepam (VALIUM) 5 MG tablet Take 1 tablet (5 mg total) by mouth daily. 30 tablet 5   diltiazem (CARDIZEM CD) 240 MG 24 hr capsule Take 1 capsule (240 mg total) by mouth daily. 90 capsule 2   fluticasone (FLONASE) 50 MCG/ACT nasal spray SPRAY 2 SPRAYS INTO EACH NOSTRIL EVERY DAY     Fluticasone-Umeclidin-Vilant (TRELEGY ELLIPTA) 200-62.5-25 MCG/ACT AEPB Inhale 1 puff into the lungs daily at 6 (six) AM. (Patient taking differently: Inhale 1 puff into the lungs daily at 6 (six) AM. As needed) 1 each 0   insulin lispro protamine-insulin lispro (HUMALOG 75/25) (75-25) 100 UNIT/ML SUSP Inject 15-20 Units into the skin See admin instructions. Inject 15 units breakfast and 20 units with dinner. May inject  5 units with lunch if blood sugar is high     isosorbide mononitrate (IMDUR) 60 MG 24 hr tablet Take one tablet ( 60 mg ) twice daily 8 hours apart. 180 tablet 3   meclizine (ANTIVERT) 25 MG tablet TAKE 1 TABLET (25 MG TOTAL) BY MOUTH 2 (TWO) TIMES DAILY AS NEEDED FOR DIZZINESS.     metoprolol succinate (TOPROL-XL) 100 MG 24 hr tablet TAKE 1 TABLET BY MOUTH EVERY DAY 90 tablet 3   montelukast (SINGULAIR) 5 MG chewable tablet CHEW 1 TABLET (5 MG TOTAL) BY MOUTH AT BEDTIME.     Multiple Vitamin (MULTIVITAMIN WITH MINERALS) TABS Take 1 tablet by mouth daily.     Nebulizers (COMPRESSOR/NEBULIZER) MISC Use with albuterol 1 each 0   nitroGLYCERIN (NITROSTAT) 0.4 MG SL tablet Place 1 tablet (0.4 mg total) under the tongue every 5 (five) minutes as needed  for chest pain. 25 tablet 3   ONE TOUCH ULTRA TEST test strip 1 each by Other route 2 (two) times daily.  3   ONETOUCH DELICA LANCETS 19F MISC 3 (three) times daily. for testing  0   pantoprazole (PROTONIX) 40 MG tablet      Spacer/Aero-Holding Chambers (AEROCHAMBER MV) inhaler Use as instructed 1 each 0   spironolactone (ALDACTONE) 25 MG tablet TAKE 1 TABLET BY MOUTH EVERY DAY 90 tablet 3   Study - ORION 4 - inclisiran 300 mg/1.6m or placebo SQ injection (PI-Stuckey) Inject 300 mg into the skin every 6 (six) months.     tapentadol (NUCYNTA) 50 MG tablet      temazepam (RESTORIL) 30 MG capsule Take 30 mg by mouth daily.     topiramate (TOPAMAX) 25 MG tablet Take 1 tablet (25 mg total) by mouth at bedtime. 30 tablet 5   Current Facility-Administered Medications on File Prior to Visit  Medication Dose Route Frequency Provider Last Rate Last Admin   cyanocobalamin ((VITAMIN B-12)) injection 1,000 mcg  1,000 mcg Intramuscular Q30 days BBinnie Rail MD   1,000 mcg at 03/26/16 1658     Review of Systems  Constitutional:  Negative for chills and fever.  Respiratory:  Positive for wheezing (with changes in weather). Negative for cough and shortness of breath.   Cardiovascular:  Positive for chest pain (intermittent chest pain), palpitations (some) and leg swelling (ankles).  Gastrointestinal:        Occ gerd - takes otc med  Musculoskeletal:        Leg cramping at night - b/l full legs - severe. Hand cramping during the day  Neurological:  Positive for light-headedness and headaches (daily).       Objective:   Vitals:   10/05/21 1303  BP: 120/68  Pulse: 60  Resp: 16  Temp: 98.7 F (37.1 C)   BP Readings from Last 3 Encounters:  10/05/21 120/68  09/18/21 136/76  08/19/21 138/75   Wt Readings from Last 3 Encounters:  10/05/21 175 lb (79.4 kg)  08/19/21 184 lb (83.5 kg)  07/28/21 181 lb (82.1 kg)   Body mass index is 32.01 kg/m.    Physical Exam Constitutional:       General: She is not in acute distress.    Appearance: Normal appearance.  HENT:     Head: Normocephalic and atraumatic.  Eyes:     Conjunctiva/sclera: Conjunctivae normal.  Cardiovascular:     Rate and Rhythm: Normal rate and regular rhythm.     Heart sounds: Normal heart sounds. No murmur heard. Pulmonary:  Effort: Pulmonary effort is normal. No respiratory distress.     Breath sounds: Normal breath sounds. No wheezing.  Musculoskeletal:     Cervical back: Neck supple.     Right lower leg: No edema.     Left lower leg: No edema.  Lymphadenopathy:     Cervical: No cervical adenopathy.  Skin:    General: Skin is warm and dry.     Findings: No rash.  Neurological:     Mental Status: She is alert. Mental status is at baseline.  Psychiatric:        Mood and Affect: Mood normal.        Behavior: Behavior normal.        Lab Results  Component Value Date   WBC 7.1 07/28/2021   HGB 12.2 07/28/2021   HCT 36.0 07/28/2021   PLT 406 (H) 07/28/2021   GLUCOSE 94 07/28/2021   CHOL 108 10/21/2020   TRIG 125 10/21/2020   HDL 27 (L) 10/21/2020   LDLCALC 58 10/21/2020   ALT 13 03/16/2021   AST 17 03/16/2021   NA 144 07/28/2021   K 3.7 07/28/2021   CL 105 07/28/2021   CREATININE 0.71 07/28/2021   BUN 18 07/28/2021   CO2 31 07/28/2021   TSH 1.13 03/16/2021   INR 1.1 07/28/2021   HGBA1C 9.5 (H) 03/16/2021   MICROALBUR 1.6 03/16/2021     Assessment & Plan:    See Problem List for Assessment and Plan of chronic medical problems.

## 2021-10-05 ENCOUNTER — Ambulatory Visit (INDEPENDENT_AMBULATORY_CARE_PROVIDER_SITE_OTHER): Payer: Medicare Other | Admitting: Internal Medicine

## 2021-10-05 VITALS — BP 120/68 | HR 60 | Temp 98.7°F | Resp 16 | Ht 62.0 in | Wt 175.0 lb

## 2021-10-05 DIAGNOSIS — E1059 Type 1 diabetes mellitus with other circulatory complications: Secondary | ICD-10-CM | POA: Diagnosis not present

## 2021-10-05 DIAGNOSIS — I25709 Atherosclerosis of coronary artery bypass graft(s), unspecified, with unspecified angina pectoris: Secondary | ICD-10-CM | POA: Diagnosis not present

## 2021-10-05 DIAGNOSIS — F419 Anxiety disorder, unspecified: Secondary | ICD-10-CM

## 2021-10-05 DIAGNOSIS — G4709 Other insomnia: Secondary | ICD-10-CM

## 2021-10-05 DIAGNOSIS — I5032 Chronic diastolic (congestive) heart failure: Secondary | ICD-10-CM | POA: Diagnosis not present

## 2021-10-05 DIAGNOSIS — R252 Cramp and spasm: Secondary | ICD-10-CM | POA: Diagnosis not present

## 2021-10-05 DIAGNOSIS — E538 Deficiency of other specified B group vitamins: Secondary | ICD-10-CM

## 2021-10-05 DIAGNOSIS — K219 Gastro-esophageal reflux disease without esophagitis: Secondary | ICD-10-CM

## 2021-10-05 DIAGNOSIS — R0989 Other specified symptoms and signs involving the circulatory and respiratory systems: Secondary | ICD-10-CM

## 2021-10-05 LAB — MAGNESIUM: Magnesium: 2.1 mg/dL (ref 1.5–2.5)

## 2021-10-05 LAB — COMPREHENSIVE METABOLIC PANEL
ALT: 11 U/L (ref 0–35)
AST: 18 U/L (ref 0–37)
Albumin: 4.3 g/dL (ref 3.5–5.2)
Alkaline Phosphatase: 75 U/L (ref 39–117)
BUN: 17 mg/dL (ref 6–23)
CO2: 29 mEq/L (ref 19–32)
Calcium: 10.2 mg/dL (ref 8.4–10.5)
Chloride: 107 mEq/L (ref 96–112)
Creatinine, Ser: 0.93 mg/dL (ref 0.40–1.20)
GFR: 57.04 mL/min — ABNORMAL LOW (ref >60.00)
Glucose, Bld: 145 mg/dL — ABNORMAL HIGH (ref 70–99)
Potassium: 4.9 mEq/L (ref 3.5–5.1)
Sodium: 143 mEq/L (ref 135–145)
Total Bilirubin: 0.5 mg/dL (ref 0.2–1.2)
Total Protein: 7.6 g/dL (ref 6.0–8.3)

## 2021-10-05 LAB — CBC WITH DIFFERENTIAL/PLATELET
Basophils Absolute: 0.1 10*3/uL (ref 0.0–0.1)
Basophils Relative: 0.8 % (ref 0.0–3.0)
Eosinophils Absolute: 0.1 10*3/uL (ref 0.0–0.7)
Eosinophils Relative: 1.6 % (ref 0.0–5.0)
HCT: 39.3 % (ref 36.0–46.0)
Hemoglobin: 12.7 g/dL (ref 12.0–15.0)
Lymphocytes Relative: 27.6 % (ref 12.0–46.0)
Lymphs Abs: 1.9 10*3/uL (ref 0.7–4.0)
MCHC: 32.3 g/dL (ref 30.0–36.0)
MCV: 84.6 fl (ref 78.0–100.0)
Monocytes Absolute: 0.5 10*3/uL (ref 0.1–1.0)
Monocytes Relative: 7.4 % (ref 3.0–12.0)
Neutro Abs: 4.4 10*3/uL (ref 1.4–7.7)
Neutrophils Relative %: 62.6 % (ref 43.0–77.0)
Platelets: 302 10*3/uL (ref 150.0–400.0)
RBC: 4.65 Mil/uL (ref 3.87–5.11)
RDW: 15.6 % — ABNORMAL HIGH (ref 11.5–15.5)
WBC: 7 10*3/uL (ref 4.0–10.5)

## 2021-10-05 LAB — HEMOGLOBIN A1C: Hgb A1c MFr Bld: 8.7 % — ABNORMAL HIGH (ref 4.6–6.5)

## 2021-10-05 MED ORDER — FUROSEMIDE 40 MG PO TABS: 40.0000 mg | ORAL_TABLET | Freq: Every day | ORAL | Status: DC | PRN

## 2021-10-05 MED ORDER — CYANOCOBALAMIN 1000 MCG/ML IJ SOLN
1000.0000 ug | Freq: Once | INTRAMUSCULAR | Status: AC
  Administered 2021-10-05: 1000 ug via INTRAMUSCULAR

## 2021-10-05 MED ORDER — FUROSEMIDE 40 MG PO TABS: 40.0000 mg | ORAL_TABLET | Freq: Every day | ORAL | Status: DC

## 2021-10-05 NOTE — Assessment & Plan Note (Signed)
Chronic Controlled-only occasional GERD Continue over-the-counter medication

## 2021-10-05 NOTE — Assessment & Plan Note (Addendum)
Chronic Not controlled I have referred her to endocrine, but she has not heard from them-encouraged that she reach out to them to see if she can schedule the appointment-if we need to reorder referral we can do that Stressed diabetic diet

## 2021-10-05 NOTE — Assessment & Plan Note (Signed)
Chronic Currently taking Restoril prescribed by a different provider-continue I think her sleep will improve if we can improve her night leg cramps

## 2021-10-05 NOTE — Assessment & Plan Note (Addendum)
Chronic Following with cardiology Has intermittent chest pain, but they are aware of this and it is not cardiac in nature Continue current medications

## 2021-10-05 NOTE — Assessment & Plan Note (Addendum)
Chronic Euvolemic on exam Follows with cardiology Continue furosemide 40 mg daily-she does take this on a daily basis

## 2021-10-05 NOTE — Assessment & Plan Note (Addendum)
Chronic Blood pressure well controlled CMP Continue spironolactone 25 mg daily, metoprolol XL 100 mg daily, isosorbide mononitrate 60 mg twice daily, diltiazem 240 mg daily

## 2021-10-05 NOTE — Assessment & Plan Note (Signed)
Chronic Severe bilateral whole leg muscle cramping that wakes her up from sleep Currently taking baclofen 40 mg twice daily, which she does feel like it helps She is drinking plenty of fluids Check CMP, magnesium She does have not have proven sleep apnea, but when she went for the test she was barely able to sleep.  It was found that she her oxygen level dropped and I wonder if this is causing her cramping-sees pulmonary tomorrow for reevaluation Denies back pain Continue increased fluids May need to consider adding low-dose potassium depending on potassium level today May need to consider different muscle relaxer at night

## 2021-10-05 NOTE — Assessment & Plan Note (Addendum)
Chronic She does not feel overly anxious Discussed that we can consider starting a daily SSRI, but will hold off for now Most of her anxiety is related to sleep and leg cramping Discontinue diazepam since she is taking that in the evening so that she will be able to rest to go to sleep Can continue Restoril at night-being prescribed by a different provider Need to see if we can improve the leg cramping

## 2021-10-05 NOTE — Assessment & Plan Note (Addendum)
Chronic Continue B12 injections monthly, which she gets here and is not always compliant with B12 injection today

## 2021-10-06 ENCOUNTER — Ambulatory Visit (INDEPENDENT_AMBULATORY_CARE_PROVIDER_SITE_OTHER): Payer: Medicare Other | Admitting: Adult Health

## 2021-10-06 ENCOUNTER — Telehealth: Payer: Self-pay | Admitting: *Deleted

## 2021-10-06 ENCOUNTER — Ambulatory Visit (INDEPENDENT_AMBULATORY_CARE_PROVIDER_SITE_OTHER): Payer: Medicare Other

## 2021-10-06 ENCOUNTER — Encounter: Payer: Self-pay | Admitting: Adult Health

## 2021-10-06 ENCOUNTER — Telehealth: Payer: Self-pay

## 2021-10-06 DIAGNOSIS — G4733 Obstructive sleep apnea (adult) (pediatric): Secondary | ICD-10-CM | POA: Diagnosis not present

## 2021-10-06 DIAGNOSIS — J449 Chronic obstructive pulmonary disease, unspecified: Secondary | ICD-10-CM | POA: Diagnosis not present

## 2021-10-06 DIAGNOSIS — Z0000 Encounter for general adult medical examination without abnormal findings: Secondary | ICD-10-CM

## 2021-10-06 DIAGNOSIS — I5032 Chronic diastolic (congestive) heart failure: Secondary | ICD-10-CM | POA: Diagnosis not present

## 2021-10-06 NOTE — Addendum Note (Signed)
Addended by: Vanessa Barbara on: 10/06/2021 03:15 PM   Modules accepted: Orders

## 2021-10-06 NOTE — Telephone Encounter (Signed)
   Pre-operative Risk Assessment    Patient Name: [name]  DOB: April 06, 1940 MRN: [id number]      Request for Surgical Clearance    Procedure:   REVERSE TOTAL SHOULDER  Date of Surgery:  Clearance TBD                                 Surgeon:  DR. Esmond Plants [facility] or [facility] Name:  [facility] Phone number:  [phone] Fax number:  [phone] ATTN: ASHLEY HILTON   Type of Clearance Requested:   - Medical  - Pharmacy:  Hold Clopidogrel (Plavix)     Type of Anesthesia:  General    Additional requests/questions:    [name]   10/06/2021, 12:53 PM

## 2021-10-06 NOTE — Progress Notes (Signed)
$'@Patient'l$  ID: [name], female    DOB: [birth year], 81 y.o.   MRN: [id number]  Chief Complaint  Patient presents with   Consult    Referring provider: Binnie Rail, MD  HPI: 81 year old female former smoker followed for COPD with asthma overlap syndrome, obstructive sleep apnea and pulmonary hypertension She is a Theme park manager  Medical history significant for type 1 diabetes insulin-dependent   TEST/EVENTS :  Occupation: Retired Education officer, museum from [employer] Exposures: No known exposures.  No mold, hot tub, Jacuzzi, no down pillows or comforters. Smoking history: 10-pack-year smoker.  Quit around 1990 Travel history: No significant travel history Relevant family history: No significant family history of lung disease  CT chest 12/20/2017- right lower lobe nodular opacity has resolved compatible with atelectasis.  Apical pulmonary nodule decreased in size.  Emphysema. 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  No acute pulmonary disease. Chest x-ray 03/08/2019- Mild interstitial markings with atelectasis.     PFTs: 06/04/15: FVC 1.57 L (77%) FEV1 1.01 L (64%) FEV1/FVC 0.65 FEF 25-75 0.54 L (39%) negative bronchodilator response 02/21/15: FVC 1.60 L (78%) FEV1 1.03 L (65%) FEV1/FVC 0.64 FEF 25-75 0.51 L (37%) positive bronchodilator response 11/20/14: FVC 1.44 L (70%) FEV1 0.91 L (57%) FEV1/FVC 0.63 FEF 25-75 0.47 L (33%) positive bronchodilator response TLC 3.92 L (79%) RV 103% DLCO uncorrected 64% 11/12/13: FVC 1.57 L (74%) FEV1 1.09 L (67%) FEV1/FVC 0.70 FEF 25-75 0.72 L (50%) negative bronchodilator response 08/04/11: FVC 1.61 L (60%) FEV1 0.89 L (48%) FEV1/FVC 0.56 FEF 25-75 0.29 L (14%) positive bronchodilator response TLC 3.67 L (80%) RV 103% ERV 46% DLCO corrected 38%   Labs: CBC 10/20/2017-WBC 6, eos 2.6%, absolute eosinophil count 156 CBC 02/12/2019-WBC 7.1, eos 1.8%, absolute eosinophil count 128   Sleep Home sleep study 03/29/2017- AHI 1.8, desats to 78% PSG 10/07/2016-  AHI 6.8.  Total sleep time 40.5 minutes.   In lab sleep study 11/20/2018-mild sleep apnea AHI 13.3, low O2 sat of 81%.   2D echo January 2019 EF 65 to 93%, grade 2 diastolic dysfunction, mild mitral valve stenosis pulmonary hypertension with pulmonary artery pressure 44 mmHg  10/06/2021 Follow up : COPD w/ asthma and OSA Patient returns for a 4-monthfollow-up.  Patient has underlying COPD with asthma.  Last visit she was restarted on Trelegy.  Patient does feel like this is helping her breathing with decreased shortness of breath and cough.  Also has had decreased wheezing.  Patient denies any chest pain orthopnea PND or increased leg swelling. Chest x-ray last visit showed clear lungs.  Patient has underlying mild obstructive sleep apnea.  She has had multiple sleep studies in the past including 2018, 2019 and 2020.  Patient was started on CPAP in the past but DME/insurance took her CPAP back due to noncompliance.  Patient says she did try to wear it each night but only gotten about 4 or less hours of sleep. Patient says she has ongoing restless sleep and daytime fatigue.  Patient says she has episodes where she has jerking of her extremities during her sleep.  Also has significant muscle cramps in her legs.  We had discussed doing a repeat sleep study because she has ongoing symptoms plus underlying multiple comorbidities including pulmonary hypertension, diastolic heart failure and COPD, diabetes.    Allergies  Allergen Reactions   Amlodipine Other (See Comments)    hallucinations    Banana Nausea And Vomiting    Stomach pumped   Co Q10 [Coenzyme Q10]  Other (See Comments)    Body cramps   Codeine Other (See Comments)    Hallucinate, loose identity and don't know who I am   Insulin Aspart     Other reaction(s): Unknown   Lisinopril     nose bleed   Metformin And Related Nausea And Vomiting   Morphine Other (See Comments)    Can not function, it immobilizes me    Pentazocine Nausea  And Vomiting   Pravastatin Other (See Comments)    Hands locked up   Repatha [Evolocumab] Other (See Comments)    myalgias   Statins Other (See Comments)    Muscle cramps   Sulfa Antibiotics Swelling   Sulfonamide Derivatives Swelling   Tramadol Hcl Other (See Comments)    Dizziness, cant function     Immunization History  Administered Date(s) Administered   Fluad Quad(high Dose 65+) 09/29/2018   Influenza Split 12/15/2011, 11/08/2012, 11/19/2014   Influenza Whole 10/09/2008   Influenza, High Dose Seasonal PF 11/19/2015, 11/23/2016, 10/26/2017, 10/21/2020   Influenza-Unspecified 10/29/2013   PFIZER(Purple Top)SARS-COV-2 Vaccination 04/05/2019, 05/01/2019   Pfizer Covid-19 Vaccine Bivalent Booster 14yr & up 10/21/2020   Pneumococcal Conjugate-13 09/03/2015   Pneumococcal Polysaccharide-23 02/09/2004   Pneumococcal-Unspecified 02/09/2012    Past Medical History:  Diagnosis Date   Adenomatous colon polyp    Allergy    Anxiety    Asthma    Carotid artery disease [place]    carotid [place]: bilat ICA 1-39%   Chronic diastolic CHF    Echo 14/6962 EF 65-70, Gr 2 DD, mild MS (mean 5), PASP 44   COPD    pt is unsure if has been officially diagnosed   Coronary artery disease    CABG '09- cathed 12/09, 9/10, 6/11, 3/14 and 12/13/16- medical Rx // cath 12/2016 - 2/3 grafts patent >> med Rx // Myoview 12/17: low risk   Diabetes mellitus    Gastroesophageal reflux disease    Hiatal hernia    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   Hyperlipidemia    Hypertension    PONV (postoperative nausea and vomiting)    Schatzki's ring    Shoulder injury    resolved after shoulder surgery   Sleep apnea    not on cpap    Tobacco History: Social History   Tobacco Use  Smoking Status Former   Packs/day: 0.50   Years: 21.00   Total pack years: 10.50   Types: Cigarettes   Start date: 02/09/1955   Quit date: 02/09/1976   Years since quitting: 45.6  Smokeless Tobacco Never    Counseling given: Not Answered   Outpatient Medications Prior to Visit  Medication Sig Dispense Refill   acetaminophen (TYLENOL) 325 MG tablet Take 650 mg by mouth every 6 (six) hours as needed for moderate pain or headache.     albuterol (PROVENTIL HFA;VENTOLIN HFA) 108 (90 BASE) MCG/ACT inhaler Inhale 2 puffs into the lungs every 6 (six) hours as needed. For shortness of breath. 1 Inhaler 0   albuterol (PROVENTIL) (2.5 MG/3ML) 0.083% nebulizer solution USE 1 VIAL IN NEBULIZER 4 TIMES DAILY (Patient taking differently: Take 2.5 mg by nebulization every 6 (six) hours as needed for wheezing or shortness of breath.) 360 mL 1   alum & mag hydroxide-simeth (MAALOX/MYLANTA) 200-200-20 MG/5ML suspension Take 30 mLs as needed by mouth for indigestion or heartburn.     aspirin EC 81 MG tablet Take 1 tablet (81 mg total) by mouth daily.  baclofen (LIORESAL) 20 MG tablet TAKE 2 TABLETS BY MOUTH 2 TIMES DAILY. 360 tablet 2   Bempedoic Acid-Ezetimibe (NEXLIZET) 180-10 MG TABS Take 1 tablet by mouth daily. 90 tablet 3   calcium carbonate (OS-CAL) 1250 (500 Ca) MG chewable tablet Chew 500 mg by mouth daily as needed for heartburn.     Cholecalciferol (VITAMIN D) 50 MCG (2000 UT) tablet Take 4,000 Units by mouth daily.     Continuous Blood Gluc Sensor (FREESTYLE LIBRE 14 DAY SENSOR) MISC UAD to monitor sugars  E10.9 9 each 3   Cyanocobalamin (VITAMIN B-12 IJ) Inject 1,000 mcg as directed every 30 (thirty) days.     diltiazem (CARDIZEM CD) 240 MG 24 hr capsule Take 1 capsule (240 mg total) by mouth daily. 90 capsule 2   fluticasone (FLONASE) 50 MCG/ACT nasal spray SPRAY 2 SPRAYS INTO EACH NOSTRIL EVERY DAY     Fluticasone-Umeclidin-Vilant (TRELEGY ELLIPTA) 200-62.5-25 MCG/ACT AEPB Inhale 1 puff into the lungs daily at 6 (six) AM. (Patient taking differently: Inhale 1 puff into the lungs daily at 6 (six) AM. As needed) 1 each 0   furosemide (LASIX) 40 MG tablet Take 1 tablet (40 mg total) by mouth daily. 30  tablet    insulin lispro protamine-insulin lispro (HUMALOG 75/25) (75-25) 100 UNIT/ML SUSP Inject 15-20 Units into the skin See admin instructions. Inject 15 units breakfast and 20 units with dinner. May inject 5 units with lunch if blood sugar is high     isosorbide mononitrate (IMDUR) 60 MG 24 hr tablet Take one tablet ( 60 mg ) twice daily 8 hours apart. 180 tablet 3   meclizine (ANTIVERT) 25 MG tablet TAKE 1 TABLET (25 MG TOTAL) BY MOUTH 2 (TWO) TIMES DAILY AS NEEDED FOR DIZZINESS.     metoprolol succinate (TOPROL-XL) 100 MG 24 hr tablet TAKE 1 TABLET BY MOUTH EVERY DAY 90 tablet 3   montelukast (SINGULAIR) 5 MG chewable tablet CHEW 1 TABLET (5 MG TOTAL) BY MOUTH AT BEDTIME.     Multiple Vitamin (MULTIVITAMIN WITH MINERALS) TABS Take 1 tablet by mouth daily.     Nebulizers (COMPRESSOR/NEBULIZER) MISC Use with albuterol 1 each 0   nitroGLYCERIN (NITROSTAT) 0.4 MG SL tablet Place 1 tablet (0.4 mg total) under the tongue every 5 (five) minutes as needed for chest pain. 25 tablet 3   ONE TOUCH ULTRA TEST test strip 1 each by Other route 2 (two) times daily.  3   ONETOUCH DELICA LANCETS 55H MISC 3 (three) times daily. for testing  0   pantoprazole (PROTONIX) 40 MG tablet      Spacer/Aero-Holding Chambers (AEROCHAMBER MV) inhaler Use as instructed 1 each 0   spironolactone (ALDACTONE) 25 MG tablet TAKE 1 TABLET BY MOUTH EVERY DAY 90 tablet 3   Study - ORION 4 - inclisiran 300 mg/1.64m or placebo SQ injection (PI-Stuckey) Inject 300 mg into the skin every 6 (six) months.     tapentadol (NUCYNTA) 50 MG tablet      temazepam (RESTORIL) 30 MG capsule Take 30 mg by mouth daily.     topiramate (TOPAMAX) 25 MG tablet Take 1 tablet (25 mg total) by mouth at bedtime. 30 tablet 5   Facility-Administered Medications Prior to Visit  Medication Dose Route Frequency Provider Last Rate Last Admin   cyanocobalamin ((VITAMIN B-12)) injection 1,000 mcg  1,000 mcg Intramuscular Q30 days BBinnie Rail MD   1,000  mcg at 03/26/16 1658     Review of Systems:   Constitutional:  No  weight loss, night sweats,  Fevers, chills,  +fatigue, or  lassitude.  HEENT:   No headaches,  Difficulty swallowing,  Tooth/dental problems, or  Sore throat,                No sneezing, itching, ear ache, nasal congestion, post nasal drip,   CV:  No chest pain,  Orthopnea, PND, swelling in lower extremities, anasarca, dizziness, palpitations, syncope.   GI  No heartburn, indigestion, abdominal pain, nausea, vomiting, diarrhea, change in bowel habits, loss of appetite, bloody stools.   Resp: .  No chest wall deformity  Skin: no rash or lesions.  GU: no dysuria, change in color of urine, no urgency or frequency.  No flank pain, no hematuria   MS:  No joint pain or swelling.  No decreased range of motion.  No back pain.    Physical Exam  BP 106/66 (BP Location: Right Arm, Patient Position: Sitting, Cuff Size: Large)   Pulse 78   Temp 98.7 F (37.1 C) (Oral)   Ht '5\' 2"'$  (1.575 m)   Wt 176 lb 9.6 oz (80.1 kg)   LMP  (LMP Unknown)   SpO2 100%   BMI 32.30 kg/m   GEN: A/Ox3; pleasant , NAD, well nourished    HEENT:  [place]/AT,  EACs-clear, TMs-wnl, NOSE-clear, THROAT-clear, no lesions, no postnasal drip or exudate noted.   NECK:  Supple w/ fair ROM; no JVD; normal carotid impulses w/o bruits; no thyromegaly or nodules palpated; no lymphadenopathy.    RESP  Clear  P & A; w/o, wheezes/ rales/ or rhonchi. no accessory muscle use, no dullness to percussion  CARD:  RRR, no m/r/g, no peripheral edema, pulses intact, no cyanosis or clubbing.  GI:   Soft & nt; nml bowel sounds; no organomegaly or masses detected.   Musco: Warm bil, no deformities or joint swelling noted.   Neuro: alert, no focal deficits noted.    Skin: Warm, no lesions or rashes    Lab Results:    BNP   Imaging: No results found.  cyanocobalamin (VITAMIN B12) injection 1,000 mcg     Date Action Dose Route User   10/05/2021 1349  Given 1,000 mcg Intramuscular (Right Deltoid) Marcina Millard, CMA          Latest Ref Rng & Units 12/23/2016   12:53 PM 06/04/2015    1:01 PM 02/21/2015    1:04 PM 11/20/2014   11:21 AM 11/12/2013   10:23 AM  PFT Results  FVC-Pre L 1.44  1.57  1.60  1.44  1.57   FVC-Predicted Pre % 72  77  78  70  74   FVC-Post L 1.50  1.63  1.82  1.73  1.66   FVC-Predicted Post % 75  80  89  84  79   Pre FEV1/FVC % % 67  65  64  63  70   Post FEV1/FCV % % 68  69  67  70  71   FEV1-Pre L 0.96  1.01  1.03  0.91  1.09   FEV1-Predicted Pre % 63  64  65  57  67   FEV1-Post L 1.03  1.12  1.23  1.21  1.17   DLCO uncorrected ml/min/mmHg 10.35    14.72    DLCO UNC% % 45    64    DLVA Predicted % 73    98    TLC L 3.85    3.92    TLC % Predicted %  78    79    RV % Predicted % 102    103      Lab Results  Component Value Date   NITRICOXIDE 7 05/05/2017        Assessment & Plan:   OSA (obstructive sleep apnea) with hypoxia Patient has underlying mild obstructive sleep apnea.  She has had multiple sleep studies showing mild sleep apnea.  She has had CPAP before does feel like it helped but did not have enough daily usage to satisfy the insurance company for payment.  Her CPAP machine was returned back to DME.  We had a long discussion regarding her symptoms burden.  Patient has restless sleep, daytime fatigue.  She has multiple comorbidities.  She would like to be reevaluated for sleep apnea and possibly start back on CPAP.  We did discuss that she will have to have daily compliance of over 4 hours of sleep.  Patient says she is taking some new medicines which are helping her sleep more than 4 hours usually getting in about 5 to 6 hours each night.  Plan  Patient Instructions  Set up for home sleep study .  Healthy sleep regimen  Do not drive if sleepy .   Trelegy 1 puff daily , rinse after use.  Albuterol inhaler or neb As needed   Activity as tolerated.  Follow up with Dr. Vaughan Browner in 3 months  and As needed     '   COPD with asthma ([place]) Improved symptom control on Trelegy.  Plan  Patient Instructions  Set up for home sleep study .  Healthy sleep regimen  Do not drive if sleepy .   Trelegy 1 puff daily , rinse after use.  Albuterol inhaler or neb As needed   Activity as tolerated.  Follow up with Dr. Vaughan Browner in 3 months and As needed        Chronic diastolic CHF (congestive heart failure) ([place]) Appears euvolemic on exam.  Continue follow-up with cardiology     Rexene Edison, NP 10/06/2021

## 2021-10-06 NOTE — Patient Instructions (Signed)
[name] , Thank you for taking time to come for your Medicare Wellness Visit. I appreciate your ongoing commitment to your health goals. Please review the following plan we discussed and let me know if I can assist you in the future.   Screening recommendations/referrals: Colonoscopy: Not a candidate for screening due to age 81: 02/06/2021; due every year Bone Density: Ordered 03/16/2021 Recommended yearly ophthalmology/optometry visit for glaucoma screening and checkup Recommended yearly dental visit for hygiene and checkup  Vaccinations: Influenza vaccine: due Fall 2023 Pneumococcal vaccine: 02/09/2012, 10/22/2015 Tdap vaccine: due Shingles vaccine: due; can not afford at this time   Covid-19: 04/05/2019, 05/01/2019, 10/21/2020  Advanced directives: Yes; Please bring a copy of your health care power of attorney and living will to the office at your convenience.  Conditions/risks identified: Yes; Type I Diabetes  Next appointment: Please schedule your next Medicare Wellness Visit with your Nurse Health Advisor in 1 year by calling [phone].   Preventive Care 26 Years and Older, Female Preventive care refers to lifestyle choices and visits with your health care provider that can promote health and wellness. What does preventive care include? A yearly physical exam. This is also called an annual well check. Dental exams once or twice a year. Routine eye exams. Ask your health care provider how often you should have your eyes checked. Personal lifestyle choices, including: Daily care of your teeth and gums. Regular physical activity. Eating a healthy diet. Avoiding tobacco and drug use. Limiting alcohol use. Practicing safe sex. Taking low-dose aspirin every day. Taking vitamin and mineral supplements as recommended by your health care provider. What happens during an annual well check? The services and screenings done by your health care provider during your annual well  check will depend on your age, overall health, lifestyle risk factors, and family history of disease. Counseling  Your health care provider may ask you questions about your: Alcohol use. Tobacco use. Drug use. Emotional well-being. Home and relationship well-being. Sexual activity. Eating habits. History of falls. Memory and ability to understand (cognition). Work and work Statistician. Reproductive health. Screening  You may have the following tests or measurements: Height, weight, and BMI. Blood pressure. Lipid and cholesterol levels. These may be checked every 5 years, or more frequently if you are over 87 years old. Skin check. Lung cancer screening. You may have this screening every year starting at age 37 if you have a 30-pack-year history of smoking and currently smoke or have quit within the past 15 years. Fecal occult blood test (FOBT) of the stool. You may have this test every year starting at age 96. Flexible sigmoidoscopy or colonoscopy. You may have a sigmoidoscopy every 5 years or a colonoscopy every 10 years starting at age 56. Hepatitis C blood test. Hepatitis B blood test. Sexually transmitted disease (STD) testing. Diabetes screening. This is done by checking your blood sugar (glucose) after you have not eaten for a while (fasting). You may have this done every 1-3 years. Bone density scan. This is done to screen for osteoporosis. You may have this done starting at age 61. Mammogram. This may be done every 1-2 years. Talk to your health care provider about how often you should have regular mammograms. Talk with your health care provider about your test results, treatment options, and if necessary, the need for more tests. Vaccines  Your health care provider may recommend certain vaccines, such as: Influenza vaccine. This is recommended every year. Tetanus, diphtheria, and acellular pertussis (Tdap, Td) vaccine.  You may need a Td booster every 10 years. Zoster  vaccine. You may need this after age 16. Pneumococcal 13-valent conjugate (PCV13) vaccine. One dose is recommended after age 84. Pneumococcal polysaccharide (PPSV23) vaccine. One dose is recommended after age 9. Talk to your health care provider about which screenings and vaccines you need and how often you need them. This information is not intended to replace advice given to you by your health care provider. Make sure you discuss any questions you have with your health care provider. Document Released: 02/21/2015 Document Revised: 10/15/2015 Document Reviewed: 11/26/2014 Elsevier Interactive Patient Education  2017 [employer] Prevention in the Home Falls can cause injuries. They can happen to people of all ages. There are many things you can do to make your home safe and to help prevent falls. What can I do on the outside of my home? Regularly fix the edges of walkways and driveways and fix any cracks. Remove anything that might make you trip as you walk through a door, such as a raised step or threshold. Trim any bushes or trees on the path to your home. Use bright outdoor lighting. Clear any walking paths of anything that might make someone trip, such as rocks or tools. Regularly check to see if handrails are loose or broken. Make sure that both sides of any steps have handrails. Any raised decks and porches should have guardrails on the edges. Have any leaves, snow, or ice cleared regularly. Use sand or salt on walking paths during winter. Clean up any spills in your garage right away. This includes oil or grease spills. What can I do in the bathroom? Use night lights. Install grab bars by the toilet and in the tub and shower. Do not use towel bars as grab bars. Use non-skid mats or decals in the tub or shower. If you need to sit down in the shower, use a plastic, non-slip stool. Keep the floor dry. Clean up any water that spills on the floor as soon as it happens. Remove  soap buildup in the tub or shower regularly. Attach bath mats securely with double-sided non-slip rug tape. Do not have throw rugs and other things on the floor that can make you trip. What can I do in the bedroom? Use night lights. Make sure that you have a light by your bed that is easy to reach. Do not use any sheets or blankets that are too big for your bed. They should not hang down onto the floor. Have a firm chair that has side arms. You can use this for support while you get dressed. Do not have throw rugs and other things on the floor that can make you trip. What can I do in the kitchen? Clean up any spills right away. Avoid walking on wet floors. Keep items that you use a lot in easy-to-reach places. If you need to reach something above you, use a strong step stool that has a grab bar. Keep electrical cords out of the way. Do not use floor polish or wax that makes floors slippery. If you must use wax, use non-skid floor wax. Do not have throw rugs and other things on the floor that can make you trip. What can I do with my stairs? Do not leave any items on the stairs. Make sure that there are handrails on both sides of the stairs and use them. Fix handrails that are broken or loose. Make sure that handrails are as long as  the stairways. Check any carpeting to make sure that it is firmly attached to the stairs. Fix any carpet that is loose or worn. Avoid having throw rugs at the top or bottom of the stairs. If you do have throw rugs, attach them to the floor with carpet tape. Make sure that you have a light switch at the top of the stairs and the bottom of the stairs. If you do not have them, ask someone to add them for you. What else can I do to help prevent falls? Wear shoes that: Do not have high heels. Have rubber bottoms. Are comfortable and fit you well. Are closed at the toe. Do not wear sandals. If you use a stepladder: Make sure that it is fully opened. Do not climb a  closed stepladder. Make sure that both sides of the stepladder are locked into place. Ask someone to hold it for you, if possible. Clearly mark and make sure that you can see: Any grab bars or handrails. First and last steps. Where the edge of each step is. Use tools that help you move around (mobility aids) if they are needed. These include: Canes. Walkers. Scooters. Crutches. Turn on the lights when you go into a dark area. Replace any light bulbs as soon as they burn out. Set up your furniture so you have a clear path. Avoid moving your furniture around. If any of your floors are uneven, fix them. If there are any pets around you, be aware of where they are. Review your medicines with your doctor. Some medicines can make you feel dizzy. This can increase your chance of falling. Ask your doctor what other things that you can do to help prevent falls. This information is not intended to replace advice given to you by your health care provider. Make sure you discuss any questions you have with your health care provider. Document Released: 11/21/2008 Document Revised: 07/03/2015 Document Reviewed: 03/01/2014 Elsevier Interactive Patient Education  2017 [employer].

## 2021-10-06 NOTE — Telephone Encounter (Signed)
Pt calling to advise Dr. Quay Burow that her NEW PT ENDO appt is scheduled for 02/16/22.

## 2021-10-06 NOTE — Assessment & Plan Note (Signed)
Improved symptom control on Trelegy.  Plan  Patient Instructions  Set up for home sleep study .  Healthy sleep regimen  Do not drive if sleepy .   Trelegy 1 puff daily , rinse after use.  Albuterol inhaler or neb As needed   Activity as tolerated.  Follow up with Dr. Vaughan Browner in 3 months and As needed

## 2021-10-06 NOTE — Patient Instructions (Addendum)
Set up for home sleep study .  Healthy sleep regimen  Do not drive if sleepy .   Trelegy 1 puff daily , rinse after use.  Albuterol inhaler or neb As needed   Activity as tolerated.  Follow up with Dr. Vaughan Browner in 3 months and As needed

## 2021-10-06 NOTE — Progress Notes (Signed)
I connected with [name] today by telephone and verified that I am speaking with the correct person using two identifiers. Location patient: home Location provider: work Persons participating in the virtual visit: patient, provider.   I discussed the limitations, risks, security and privacy concerns of performing an evaluation and management service by telephone and the availability of in person appointments. I also discussed with the patient that there may be a patient responsible charge related to this service. The patient expressed understanding and verbally consented to this telephonic visit.    Interactive audio and video telecommunications were attempted between this provider and patient, however failed, due to patient having technical difficulties OR patient did not have access to video capability.  We continued and completed visit with audio only.  Some vital signs may be absent or patient reported.   Time Spent with patient on telephone encounter: 30 minutes  Subjective:   [name] is a 81 y.o. female who presents for Medicare Annual (Subsequent) preventive examination.  Review of Systems     Cardiac Risk Factors include: advanced age (>26mn, >>53women);diabetes mellitus;dyslipidemia;family history of premature cardiovascular disease;hypertension;obesity (BMI >30kg/m2);sedentary lifestyle     Objective:    There were no vitals filed for this visit. There is no height or weight on file to calculate BMI.     10/06/2021   10:06 AM 08/19/2021    9:21 AM 07/28/2021    8:39 AM 04/02/2021   10:57 AM 10/17/2020   11:16 AM 09/29/2020    4:33 PM 08/24/2020    2:52 PM  Advanced Directives  Does Patient Have a Medical Advance Directive? Yes Yes Yes Yes Yes Yes No  Type of Advance Directive Living will;Healthcare Power of [place];Out of facility DNR (pink MOST or yellow form) H[place]jamin[place] will [place] will Living will;Healthcare Power of Attorney Living will;Healthcare Power of Attorney   Does patient want to make changes to medical advance directive? No - Patient declined     No - Patient declined   Copy of HCarter Lakein Chart? No - copy requested    No - copy requested No - copy requested   Would patient like information on creating a medical advance directive?       No - Patient declined    Current Medications (verified) Outpatient Encounter Medications as of 10/06/2021  Medication Sig   acetaminophen (TYLENOL) 325 MG tablet Take 650 mg by mouth every 6 (six) hours as needed for moderate pain or headache.   albuterol (PROVENTIL HFA;VENTOLIN HFA) 108 (90 BASE) MCG/ACT inhaler Inhale 2 puffs into the lungs every 6 (six) hours as needed. For shortness of breath.   albuterol (PROVENTIL) (2.5 MG/3ML) 0.083% nebulizer solution USE 1 VIAL IN NEBULIZER 4 TIMES DAILY (Patient taking differently: Take 2.5 mg by nebulization every 6 (six) hours as needed for wheezing or shortness of breath.)   alum & mag hydroxide-simeth (MAALOX/MYLANTA) 200-200-20 MG/5ML suspension Take 30 mLs as needed by mouth for indigestion or heartburn.   aspirin EC 81 MG tablet Take 1 tablet (81 mg total) by mouth daily.   baclofen (LIORESAL) 20 MG tablet TAKE 2 TABLETS BY MOUTH 2 TIMES DAILY.   Bempedoic Acid-Ezetimibe (NEXLIZET) 180-10 MG TABS Take 1 tablet by mouth daily.   calcium carbonate (OS-CAL) 1250 (500 Ca) MG chewable tablet Chew 500 mg by mouth daily as needed for heartburn.   Cholecalciferol (VITAMIN D) 50 MCG (2000 UT)  tablet Take 4,000 Units by mouth daily.   Continuous Blood Gluc Sensor (FREESTYLE LIBRE 14 DAY SENSOR) MISC UAD to monitor sugars  E10.9   Cyanocobalamin (VITAMIN B-12 IJ) Inject 1,000 mcg as directed every 30 (thirty) days.   diltiazem (CARDIZEM CD) 240 MG 24 hr capsule Take 1 capsule (240 mg total) by mouth daily.   fluticasone (FLONASE) 50 MCG/ACT nasal spray  SPRAY 2 SPRAYS INTO EACH NOSTRIL EVERY DAY   Fluticasone-Umeclidin-Vilant (TRELEGY ELLIPTA) 200-62.5-25 MCG/ACT AEPB Inhale 1 puff into the lungs daily at 6 (six) AM. (Patient taking differently: Inhale 1 puff into the lungs daily at 6 (six) AM. As needed)   furosemide (LASIX) 40 MG tablet Take 1 tablet (40 mg total) by mouth daily.   insulin lispro protamine-insulin lispro (HUMALOG 75/25) (75-25) 100 UNIT/ML SUSP Inject 15-20 Units into the skin See admin instructions. Inject 15 units breakfast and 20 units with dinner. May inject 5 units with lunch if blood sugar is high   isosorbide mononitrate (IMDUR) 60 MG 24 hr tablet Take one tablet ( 60 mg ) twice daily 8 hours apart.   meclizine (ANTIVERT) 25 MG tablet TAKE 1 TABLET (25 MG TOTAL) BY MOUTH 2 (TWO) TIMES DAILY AS NEEDED FOR DIZZINESS.   metoprolol succinate (TOPROL-XL) 100 MG 24 hr tablet TAKE 1 TABLET BY MOUTH EVERY DAY   montelukast (SINGULAIR) 5 MG chewable tablet CHEW 1 TABLET (5 MG TOTAL) BY MOUTH AT BEDTIME.   Multiple Vitamin (MULTIVITAMIN WITH MINERALS) TABS Take 1 tablet by mouth daily.   Nebulizers (COMPRESSOR/NEBULIZER) MISC Use with albuterol   nitroGLYCERIN (NITROSTAT) 0.4 MG SL tablet Place 1 tablet (0.4 mg total) under the tongue every 5 (five) minutes as needed for chest pain.   ONE TOUCH ULTRA TEST test strip 1 each by Other route 2 (two) times daily.   ONETOUCH DELICA LANCETS 93Y MISC 3 (three) times daily. for testing   pantoprazole (PROTONIX) 40 MG tablet    Spacer/Aero-Holding Chambers (AEROCHAMBER MV) inhaler Use as instructed   spironolactone (ALDACTONE) 25 MG tablet TAKE 1 TABLET BY MOUTH EVERY DAY   Study - ORION 4 - inclisiran 300 mg/1.15m or placebo SQ injection (PI-Stuckey) Inject 300 mg into the skin every 6 (six) months.   tapentadol (NUCYNTA) 50 MG tablet    temazepam (RESTORIL) 30 MG capsule Take 30 mg by mouth daily.   topiramate (TOPAMAX) 25 MG tablet Take 1 tablet (25 mg total) by mouth at bedtime.    Facility-Administered Encounter Medications as of 10/06/2021  Medication   cyanocobalamin ((VITAMIN B-12)) injection 1,000 mcg    Allergies (verified) Amlodipine, Banana, Co q10 [coenzyme q10], Codeine, Insulin aspart, Lisinopril, Metformin and related, Morphine, Pentazocine, Pravastatin, Repatha [evolocumab], Statins, Sulfa antibiotics, Sulfonamide derivatives, and Tramadol hcl   History: Past Medical History:  Diagnosis Date   Adenomatous colon polyp    Allergy    Anxiety    Asthma    Carotid artery disease ([place]    carotid [place]: bilat ICA 1-39%   Chronic diastolic CHF    Echo 12/173 EF 65-70, Gr 2 DD, mild MS (mean 5), PASP 44   COPD    pt is unsure if has been officially diagnosed   Coronary artery disease    CABG '09- cathed 12/09, 9/10, 6/11, 3/14 and 12/13/16- medical Rx // cath 12/2016 - 2/3 grafts patent >> med Rx // Myoview 12/17: low risk   Diabetes mellitus    Gastroesophageal reflux disease    Hiatal hernia  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   Hyperlipidemia    Hypertension    PONV (postoperative nausea and vomiting)    Schatzki's ring    Shoulder injury    resolved after shoulder surgery   Sleep apnea    not on cpap   Past Surgical History:  Procedure Laterality Date   ABDOMINAL HYSTERECTOMY     APPENDECTOMY     came out with Hysterectomy   CARDIAC CATHETERIZATION  07/23/2009   EF 60%   CARDIAC CATHETERIZATION  10/11/2008   CARDIAC CATHETERIZATION  03/01/2007   EF 75-80%   CARDIOVASCULAR STRESS TEST  11/15/2007   EF 60%   COLONOSCOPY     CORONARY ARTERY BYPASS GRAFT     SEVERELY DISEASED SAPHENOUS VEIN GRAFT TO THE RIGHT CORONARY ARTERY BUT WITH FAIRLY WELL PRESERVED FLOW TO THE DISTAL RIGHT CORONARY ARTERY FROM THE NATIVE CIRCULATION-RESTART  CATH IN JUNE 2000, REVEALS MILD/MODERATE  CAD WITH GOOD FLOW DOWN HER LAD   ESOPHAGOGASTRODUODENOSCOPY     EYE SURGERY     bilateral cataract surgery with lens implant   LEFT HEART CATH AND  CORS/GRAFTS ANGIOGRAPHY N/A 12/14/2016   Procedure: LEFT HEART CATH AND CORS/GRAFTS ANGIOGRAPHY;  Surgeon: Jettie Booze, MD;  Location: [facility];  Service: Cardiovascular;  Laterality: N/A;   lense removal Left    POLYPECTOMY     RIGHT/LEFT HEART CATH AND CORONARY/GRAFT ANGIOGRAPHY N/A 07/28/2021   Procedure: RIGHT/LEFT HEART CATH AND CORONARY/GRAFT ANGIOGRAPHY;  Surgeon: Troy Sine, MD;  Location: [facility];  Service: Cardiovascular;  Laterality: N/A;   ROTATOR CUFF REPAIR     right and left   TONSILLECTOMY     age 54   TOTAL KNEE ARTHROPLASTY Right 02/20/2014   Procedure: RIGHT TOTAL KNEE ARTHROPLASTY;  Surgeon: Tobi Bastos, MD;  Location: [facility];  Service: Orthopedics;  Laterality: Right;   tumor removed kidney     UPPER GASTROINTESTINAL ENDOSCOPY     US ECHOCARDIOGRAPHY  03/08/2008   EF 55-60%   Family History  Problem Relation Age of Onset   Heart disease Maternal Grandfather    Heart failure Maternal Grandfather    Diabetes Maternal Grandfather    Heart attack Father    Diabetes Mother    Rheum arthritis Sister    Emphysema Paternal Uncle    Esophageal cancer Brother 24       she said he was born with it   Emphysema Paternal Aunt    Healthy Child    Neuropathy Neg Hx    Multiple sclerosis Neg Hx    Colon cancer Neg Hx    Colon polyps Neg Hx    Rectal cancer Neg Hx    Stomach cancer Neg Hx    Social History   Socioeconomic History   Marital status: Widowed    Spouse name: Not on file   Number of children: 4   Years of education: Doctorate   Highest education level: Doctorate  Occupational History   Occupation: Retired  Tobacco Use   Smoking status: Former    Packs/day: 0.50    Years: 21.00    Total pack years: 10.50    Types: Cigarettes    Start date: 02/09/1955    Quit date: 02/09/1976    Years since quitting: 45.6   Smokeless tobacco: Never  Vaping Use   Vaping Use: Never used  Substance and Sexual Activity   Alcohol use:  No    Alcohol/week: 0.0  standard drinks of alcohol   Drug use: No   Sexual activity: Never  Other Topics Concern   Not on file  Social History Narrative   Lives alone.  One story home.  Has 4 children.  Education: doctorate in theology.    Caffeine use: Drinks 1 cup coffee/day      Originally from [place]. Previously has lived in [place]. Prior travel to [place], [place], [place], [place], [place], [place], [place], & [place]. Previously worked in [employer] She has a dog currently. No bird, mold, or hot tub exposure. She also pastors a church.    Right Handed   Social Determinants of Health   Financial Resource Strain: Low Risk  (10/06/2021)   Overall Financial Resource Strain (CARDIA)    Difficulty of Paying Living Expenses: Not hard at all  Food Insecurity: No Food Insecurity (10/06/2021)   Hunger Vital Sign    Worried About Running Out of Food in the Last Year: Never true    Ran Out of Food in the Last Year: Never true  Transportation Needs: No Transportation Needs (10/06/2021)   PRAPARE - Hydrologist (Medical): No    Lack of Transportation (Non-Medical): No  Physical Activity: Inactive (10/06/2021)   Exercise Vital Sign    Days of Exercise per Week: 0 days    Minutes of Exercise per Session: 0 min  Stress: No Stress Concern Present (10/06/2021)   [place]    Feeling of Stress : Not at all  Social Connections: Moderately Integrated (10/06/2021)   Social Connection and Isolation Panel [NHANES]    Frequency of Communication with Friends and Family: More than three times a week    Frequency of Social Gatherings with Friends and Family: More than three times a week    Attends Religious Services: More than 4 times per year    Active Member of [employer] or Organizations: Yes    Attends Archivist Meetings: More than 4 times per year    Marital Status: Widowed    Tobacco  Counseling Counseling given: Not Answered   Clinical Intake:  Pre-visit preparation completed: Yes  Pain : No/denies pain     Nutritional Risks: None Diabetes: Yes CBG done?: No Did pt. bring in CBG monitor from home?: No  How often do you need to have someone help you when you read instructions, pamphlets, or other written materials from your doctor or pharmacy?: 1 - Never What is the last grade level you completed in school?: HSG; Doctorate Degree in Theology  Diabetic? yes  Interpreter Needed?: No  Information entered by :: Lisette Abu, LPN.   Activities of Daily Living    10/06/2021   10:11 AM  In your present state of health, do you have any difficulty performing the following activities:  Hearing? 0  Vision? 0  Difficulty concentrating or making decisions? 0  Walking or climbing stairs? 0  Dressing or bathing? 0  Doing errands, shopping? 0  Preparing Food and eating ? N  Using the Toilet? N  In the past six months, have you accidently leaked urine? Y  Do you have problems with loss of bowel control? Y  Managing your Medications? N  Managing your Finances? N  Housekeeping or managing your Housekeeping? N    Patient Care Team: Binnie Rail, MD as PCP - General (Internal Medicine) Nahser, Wonda Cheng, MD as PCP - Cardiology (Cardiology) Elsie Stain, MD as  Attending Physician (Pulmonary Disease) Ladene Artist, MD as Consulting Physician (Gastroenterology) Javier Glazier, MD (Inactive) as Consulting Physician (Pulmonary Disease) Pieter Partridge, DO as Consulting Physician (Neurology) Sharmon Revere as Physician Assistant (Cardiology) Szabat, Darnelle Maffucci, [facility] (Inactive) as Pharmacist (Pharmacist) Jola Schmidt, MD as Consulting Physician (Ophthalmology)  Indicate any recent Medical Services you may have received from other than [facility] providers in the past year (date may be approximate).     Assessment:   This is a routine wellness  examination for Shainna.  Hearing/Vision screen Hearing Screening - Comments:: Patient denied any hearing difficulty.   No hearing aids.  Vision Screening - Comments:: Patient does wear corrective lenses/contacts.  Eye exam done by: Jola Schmidt, MD.   Dietary issues and exercise activities discussed: Current Exercise Habits: The patient does not participate in regular exercise at present, Exercise limited by: respiratory conditions(s);orthopedic condition(s)   Goals Addressed             This Visit's Progress    My goasl is to stay focused, not to fall and get back into water aerobics at the [facility].        Depression Screen    10/06/2021   10:11 AM 10/05/2021    1:13 PM 07/13/2021   10:14 AM 09/29/2020    4:28 PM 10/30/2019    2:56 PM 08/01/2018    2:17 PM 01/24/2018    4:26 PM  PHQ 2/9 Scores  PHQ - 2 Score 0 0 2 0 0 0 1  PHQ- 9 Score   3    4    Fall Risk    10/06/2021   10:07 AM 10/05/2021    1:12 PM 08/19/2021    9:20 AM 07/13/2021   10:15 AM 04/02/2021   10:57 AM  Fall Risk   Falls in the past year? 0 0 0 0 0  Number falls in past yr: 0 0 0 0 0  Injury with Fall? 0 0 0 0 0  Risk for fall due to : No Fall Risks No Fall Risks  No Fall Risks   Follow up Falls evaluation completed Falls evaluation completed  Falls evaluation completed     FALL RISK PREVENTION PERTAINING TO THE HOME:  Any stairs in or around the home? No  If so, are there any without handrails? No  Home free of loose throw rugs in walkways, pet beds, electrical cords, etc? Yes  Adequate lighting in your home to reduce risk of falls? Yes   ASSISTIVE DEVICES UTILIZED TO PREVENT FALLS:  Life alert? No  Use of a cane, walker or w/c? No  Grab bars in the bathroom? Yes Shower chair or bench in shower? Yes  Elevated toilet seat or a handicapped toilet? No   TIMED UP AND GO:  Was the test performed? No .  Length of time to ambulate 10 feet: n/a sec.   Appearance of gait: Gait not evaluated during this  visit.  Cognitive Function:    01/24/2018    3:20 PM 01/14/2017    4:05 PM  MMSE - Mini Mental State Exam  Orientation to time 5 5  Orientation to Place 5 5  Registration 3 3  Attention/ Calculation 5 5  Recall 2 1  Language- name 2 objects 2 2  Language- repeat 1 1  Language- follow 3 step command 3 3  Language- read & follow direction 1 1  Write a sentence 1 1  Copy design 1 1  Total  score 29 28        10/06/2021   10:16 AM  6CIT Screen  What Year? 0 points  What month? 0 points  What time? 0 points  Count back from 20 0 points  Months in reverse 0 points  Repeat phrase 0 points  Total Score 0 points    Immunizations Immunization History  Administered Date(s) Administered   Fluad Quad(high Dose 65+) 09/29/2018   Influenza Split 12/15/2011, 11/08/2012, 11/19/2014   Influenza Whole 10/09/2008   Influenza, High Dose Seasonal PF 11/19/2015, 11/23/2016, 10/26/2017, 10/21/2020   Influenza-Unspecified 10/29/2013   PFIZER(Purple Top)SARS-COV-2 Vaccination 04/05/2019, 05/01/2019   Pfizer Covid-19 Vaccine Bivalent Booster 58yr & up 10/21/2020   Pneumococcal Conjugate-13 09/03/2015   Pneumococcal Polysaccharide-23 02/09/2004   Pneumococcal-Unspecified 02/09/2012    TDAP status: Due, Education has been provided regarding the importance of this vaccine. Advised may receive this vaccine at local pharmacy or Health Dept. Aware to provide a copy of the vaccination record if obtained from local pharmacy or Health Dept. Verbalized acceptance and understanding.  Flu Vaccine status: Due, Education has been provided regarding the importance of this vaccine. Advised may receive this vaccine at local pharmacy or Health Dept. Aware to provide a copy of the vaccination record if obtained from local pharmacy or Health Dept. Verbalized acceptance and understanding.  Pneumococcal vaccine status: Up to date  Covid-19 vaccine status: Completed vaccines  Qualifies for Shingles Vaccine?  Yes   Zostavax completed No   Shingrix Completed?: No.    Education has been provided regarding the importance of this vaccine. Patient has been advised to call insurance company to determine out of pocket expense if they have not yet received this vaccine. Advised may also receive vaccine at local pharmacy or Health Dept. Verbalized acceptance and understanding.  Screening Tests Health Maintenance  Topic Date Due   Zoster Vaccines- Shingrix (1 of 2) Never done   DEXA SCAN  Never done   FOOT EXAM  01/25/2019   OPHTHALMOLOGY EXAM  02/06/2020   COVID-19 Vaccine (4 - Pfizer series) 02/20/2021   INFLUENZA VACCINE  09/08/2021   TETANUS/TDAP  03/16/2022 (Originally 09/30/1959)   Diabetic kidney evaluation - Urine ACR  03/16/2022   HEMOGLOBIN A1C  04/07/2022   Diabetic kidney evaluation - GFR measurement  10/06/2022   Pneumonia Vaccine 81 Years old  Completed   HPV VACCINES  Aged Out    Health Maintenance  Health Maintenance Due  Topic Date Due   Zoster Vaccines- Shingrix (1 of 2) Never done   DEXA SCAN  Never done   FOOT EXAM  01/25/2019   OPHTHALMOLOGY EXAM  02/06/2020   COVID-19 Vaccine (4 - Pfizer series) 02/20/2021   INFLUENZA VACCINE  09/08/2021    Colorectal cancer screening: No longer required.   Mammogram status: Completed 02/06/2021. Repeat every year  Bone Density status: Ordered 03/16/2021. Pt provided with contact info and advised to call to schedule appt.  Lung Cancer Screening: (Low Dose CT Chest recommended if Age 83-80years, 30 pack-year currently smoking OR have quit w/in 15years.) does not qualify.   Lung Cancer Screening Referral: no  Additional Screening:  Hepatitis C Screening: does not qualify; Completed no  Vision Screening: Recommended annual ophthalmology exams for early detection of glaucoma and other disorders of the eye. Is the patient up to date with their annual eye exam?  No Who is the provider or what is the name of the office in which the  patient attends annual eye exams? BJola Schmidt  [place]. If pt is not established with a provider, would they like to be referred to a provider to establish care? No .   Dental Screening: Recommended annual dental exams for proper oral hygiene  Community Resource Referral / Chronic Care Management: CRR required this visit?  No   CCM required this visit?  No      Plan:     I have personally reviewed and noted the following in the patient's chart:   Medical and social history Use of alcohol, tobacco or illicit drugs  Current medications and supplements including opioid prescriptions. Patient is not currently taking opioid prescriptions. Functional ability and status Nutritional status Physical activity Advanced directives List of other physicians Hospitalizations, surgeries, and ER visits in previous 12 months Vitals Screenings to include cognitive, depression, and falls Referrals and appointments  In addition, I have reviewed and discussed with patient certain preventive protocols, quality metrics, and best practice recommendations. A written personalized care plan for preventive services as well as general preventive health recommendations were provided to patient.     Sheral Flow, LPN   7/48/2707   Nurse Notes:  Patient is cogitatively intact. There were no vitals filed for this visit. There is no height or weight on file to calculate BMI. Patient stated that she has no issues with gait or balance; does not use any assistive devices.

## 2021-10-06 NOTE — Assessment & Plan Note (Signed)
Appears euvolemic on exam.  Continue follow-up with cardiology 

## 2021-10-06 NOTE — Assessment & Plan Note (Signed)
Patient has underlying mild obstructive sleep apnea.  She has had multiple sleep studies showing mild sleep apnea.  She has had CPAP before does feel like it helped but did not have enough daily usage to satisfy the insurance company for payment.  Her CPAP machine was returned back to DME.  We had a long discussion regarding her symptoms burden.  Patient has restless sleep, daytime fatigue.  She has multiple comorbidities.  She would like to be reevaluated for sleep apnea and possibly start back on CPAP.  We did discuss that she will have to have daily compliance of over 4 hours of sleep.  Patient says she is taking some new medicines which are helping her sleep more than 4 hours usually getting in about 5 to 6 hours each night.  Plan  Patient Instructions  Set up for home sleep study .  Healthy sleep regimen  Do not drive if sleepy .   Trelegy 1 puff daily , rinse after use.  Albuterol inhaler or neb As needed   Activity as tolerated.  Follow up with Dr. Vaughan Browner in 3 months and As needed     '

## 2021-10-07 NOTE — Telephone Encounter (Signed)
   Name: [name]  DOB: Dec 03, 1940  MRN: [id number]  Primary Cardiologist: Mertie Moores, MD   Preoperative team, please contact this patient and set up a phone call appointment for further preoperative risk assessment. Please obtain consent and complete medication review. Thank you for your help.  I confirm that guidance regarding antiplatelet and oral anticoagulation therapy has been completed and, if necessary, noted below.  It is not clear if she is on Plavix or not. 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 Can review Plavix at [facility]. If she is taking Plavix, it should be acceptable to hold for 5 days.  Richardson Dopp, PA-C 10/07/2021, 7:51 AM [place]

## 2021-10-07 NOTE — Telephone Encounter (Signed)
1st attempt to reach pt regarding surgical clearance and the need for a tele visit, left a message for pt to call back and ask for the preop team.  

## 2021-10-09 NOTE — Telephone Encounter (Signed)
Tried to call the pt to schedule a tele pre op appt, though no answer or vm came on today. This is our 2nd attempt to reach the pt

## 2021-10-13 ENCOUNTER — Telehealth: Payer: Self-pay | Admitting: *Deleted

## 2021-10-13 ENCOUNTER — Ambulatory Visit: Payer: Medicare Other | Admitting: Internal Medicine

## 2021-10-13 NOTE — Telephone Encounter (Signed)
Pt agreeable to plan of care for tele pre op appt 10/21/21 @ 3 pm. Med rec and consent are done.      Patient Consent for Virtual Visit        [name] has provided verbal consent on 10/13/2021 for a virtual visit (video or telephone).   CONSENT FOR VIRTUAL VISIT FOR:  [name]  By participating in this virtual visit I agree to the following:  I hereby voluntarily request, consent and authorize [facility] and its employed or contracted physicians, physician assistants, nurse practitioners or other licensed health care professionals (the Practitioner), to provide me with telemedicine health care services (the "Services") as deemed necessary by the treating Practitioner. I acknowledge and consent to receive the Services by the Practitioner via telemedicine. I understand that the telemedicine visit will involve communicating with the Practitioner through live audiovisual communication technology and the disclosure of certain medical information by electronic transmission. I acknowledge that I have been given the opportunity to request an in-person assessment or other available alternative prior to the telemedicine visit and am voluntarily participating in the telemedicine visit.  I understand that I have the right to withhold or withdraw my consent to the use of telemedicine in the course of my care at any time, without affecting my right to future care or treatment, and that the Practitioner or I may terminate the telemedicine visit at any time. I understand that I have the right to inspect all information obtained and/or recorded in the course of the telemedicine visit and may receive copies of available information for a reasonable fee.  I understand that some of the potential risks of receiving the Services via telemedicine include:  Delay or interruption in medical evaluation due to technological equipment failure or disruption; Information transmitted may not be sufficient  (e.g. poor resolution of images) to allow for appropriate medical decision making by the Practitioner; and/or  In rare instances, security protocols could fail, causing a breach of personal health information.  Furthermore, I acknowledge that it is my responsibility to provide information about my medical history, conditions and care that is complete and accurate to the best of my ability. I acknowledge that Practitioner's advice, recommendations, and/or decision may be based on factors not within their control, such as incomplete or inaccurate data provided by me or distortions of diagnostic images or specimens that may result from electronic transmissions. I understand that the practice of medicine is not an exact science and that Practitioner makes no warranties or guarantees regarding treatment outcomes. I acknowledge that a copy of this consent can be made available to me via my patient portal ([facility]), or I can request a printed copy by calling the office of [facility].    I understand that my insurance will be billed for this visit.   I have read or had this consent read to me. I understand the contents of this consent, which adequately explains the benefits and risks of the Services being provided via telemedicine.  I have been provided ample opportunity to ask questions regarding this consent and the Services and have had my questions answered to my satisfaction. I give my informed consent for the services to be provided through the use of telemedicine in my medical care

## 2021-10-13 NOTE — Telephone Encounter (Signed)
Pt agreeable to plan of care for tele pre op appt 10/21/21 @ 3 pm. Med rec and consent are done.

## 2021-10-15 ENCOUNTER — Telehealth: Payer: Self-pay | Admitting: Internal Medicine

## 2021-10-15 NOTE — Telephone Encounter (Signed)
Melika at [facility] needs verbal instructions and DX code for [facility]. Call back number is [phone] extension [phone]

## 2021-10-21 ENCOUNTER — Ambulatory Visit: Payer: Medicare Other | Attending: Cardiovascular Disease | Admitting: Nurse Practitioner

## 2021-10-21 ENCOUNTER — Telehealth: Payer: Self-pay | Admitting: Cardiovascular Disease

## 2021-10-21 DIAGNOSIS — Z01810 Encounter for preprocedural cardiovascular examination: Secondary | ICD-10-CM

## 2021-10-21 NOTE — Telephone Encounter (Signed)
Patient appt today needs to get reschd per [facility]. Please advise

## 2021-10-21 NOTE — Progress Notes (Signed)
Virtual Visit via Telephone Note   Because of [name]'s co-morbid illnesses, she is at least at moderate risk for complications without adequate follow up.  This format is felt to be most appropriate for this patient at this time.  The patient did not have access to video technology/had technical difficulties with video requiring transitioning to audio format only (telephone).  All issues noted in this document were discussed and addressed.  No physical exam could be performed with this format.  Please refer to the patient's chart for her consent to telehealth for [facility].  Evaluation Performed:  Preoperative cardiovascular risk assessment _____________   Date:  10/21/2021   Patient ID:  [name], DOB 1983/09/26, MRN [id number] Patient Location:  Home Provider location:   Office  Primary Care Provider:  Binnie Rail, MD Primary Cardiologist:  Mertie Moores, MD  Chief Complaint / Patient Profile   81 y.o. y/o female with a h/o CAD s/p CABG, RBBB, chronic diastolic heart failure, hypertension, hyperlipidemia, mitral valve stenosis, type 2 diabetes, and COPD who is pending reverse total shoulder surgery, date TBD with Dr. Earlie Counts of [facility] and presents today for telephonic preoperative cardiovascular risk assessment.  Past Medical History    Past Medical History:  Diagnosis Date   Adenomatous colon polyp    Allergy    Anxiety    Asthma    Carotid artery disease ([place])    carotid US 02/2017: bilat ICA 1-39%   Chronic diastolic CHF    Echo 05/979: EF 65-70, Gr 2 DD, mild MS (mean 5), PASP 44   COPD    pt is unsure if has been officially diagnosed   Coronary artery disease    CABG '09- cathed 12/09, 9/10, 6/11, 3/14 and 12/13/16- medical Rx // cath 12/2016 - 2/3 grafts patent >> med Rx // Myoview 12/17: low risk   Diabetes mellitus    Gastroesophageal reflux disease    Hiatal hernia    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    Hyperlipidemia    Hypertension    PONV (postoperative nausea and vomiting)    Schatzki's ring    Shoulder injury    resolved after shoulder surgery   Sleep apnea    not on cpap   Past Surgical History:  Procedure Laterality Date   ABDOMINAL HYSTERECTOMY     APPENDECTOMY     came out with Hysterectomy   CARDIAC CATHETERIZATION  07/23/2009   EF 60%   CARDIAC CATHETERIZATION  10/11/2008   CARDIAC CATHETERIZATION  03/01/2007   EF 75-80%   CARDIOVASCULAR STRESS TEST  11/15/2007   EF 60%   COLONOSCOPY     CORONARY ARTERY BYPASS GRAFT     SEVERELY DISEASED SAPHENOUS VEIN GRAFT TO THE RIGHT CORONARY ARTERY BUT WITH FAIRLY WELL PRESERVED FLOW TO THE DISTAL RIGHT CORONARY ARTERY FROM THE NATIVE CIRCULATION-RESTART  CATH IN JUNE 2000, REVEALS MILD/MODERATE  CAD WITH GOOD FLOW DOWN HER LAD   ESOPHAGOGASTRODUODENOSCOPY     EYE SURGERY     bilateral cataract surgery with lens implant   LEFT HEART CATH AND CORS/GRAFTS ANGIOGRAPHY N/A 12/14/2016   Procedure: LEFT HEART CATH AND CORS/GRAFTS ANGIOGRAPHY;  Surgeon: Jettie Booze, MD;  Location: [facility];  Service: Cardiovascular;  Laterality: N/A;   lense removal Left    POLYPECTOMY     RIGHT/LEFT HEART CATH AND CORONARY/GRAFT ANGIOGRAPHY N/A 07/28/2021   Procedure: RIGHT/LEFT HEART CATH AND CORONARY/GRAFT ANGIOGRAPHY;  Surgeon: Troy Sine, MD;  Location: [facility];  Service: Cardiovascular;  Laterality: N/A;   ROTATOR CUFF REPAIR     right and left   TONSILLECTOMY     age 34   TOTAL KNEE ARTHROPLASTY Right 02/20/2014   Procedure: RIGHT TOTAL KNEE ARTHROPLASTY;  Surgeon: Tobi Bastos, MD;  Location: [facility];  Service: Orthopedics;  Laterality: Right;   tumor removed kidney     UPPER GASTROINTESTINAL ENDOSCOPY     US ECHOCARDIOGRAPHY  03/08/2008   EF 55-60%    Allergies  Allergies  Allergen Reactions   Amlodipine Other (See Comments)    hallucinations    Banana Nausea And Vomiting    Stomach pumped   Co Q10  [Coenzyme Q10] Other (See Comments)    Body cramps   Codeine Other (See Comments)    Hallucinate, loose identity and don't know who I am   Insulin Aspart     Other reaction(s): Unknown   Lisinopril     nose bleed   Metformin And Related Nausea And Vomiting   Morphine Other (See Comments)    Can not function, it immobilizes me    Pentazocine Nausea And Vomiting   Pravastatin Other (See Comments)    Hands locked up   Repatha [Evolocumab] Other (See Comments)    myalgias   Statins Other (See Comments)    Muscle cramps   Sulfa Antibiotics Swelling   Sulfonamide Derivatives Swelling   Tramadol Hcl Other (See Comments)    Dizziness, cant function     Home Medications    Prior to Admission medications   Medication Sig Start Date End Date Taking? Authorizing Provider  acetaminophen (TYLENOL) 325 MG tablet Take 650 mg by mouth every 6 (six) hours as needed for moderate pain or headache.    [provider]  albuterol (PROVENTIL HFA;VENTOLIN HFA) 108 (90 BASE) MCG/ACT inhaler Inhale 2 puffs into the lungs every 6 (six) hours as needed. For shortness of breath. 03/06/12   Elsie Stain, MD  albuterol (PROVENTIL) (2.5 MG/3ML) 0.083% nebulizer solution USE 1 VIAL IN NEBULIZER 4 TIMES DAILY Patient taking differently: Take 2.5 mg by nebulization every 6 (six) hours as needed for wheezing or shortness of breath. 05/07/20   Marshell Garfinkel, MD  alum & mag hydroxide-simeth (MAALOX/MYLANTA) 200-200-20 MG/5ML suspension Take 30 mLs as needed by mouth for indigestion or heartburn.    [provider]  aspirin EC 81 MG tablet Take 1 tablet (81 mg total) by mouth daily. 12/10/16   Richardson Dopp T, PA-C  baclofen (LIORESAL) 20 MG tablet TAKE 2 TABLETS BY MOUTH 2 TIMES DAILY. 03/13/21   Binnie Rail, MD  Bempedoic Acid-Ezetimibe (NEXLIZET) 180-10 MG TABS Take 1 tablet by mouth daily. 08/26/21   Richardson Dopp T, PA-C  calcium carbonate (OS-CAL) 1250 (500 Ca) MG chewable tablet Chew 500 mg  by mouth daily as needed for heartburn.    [provider]  Cholecalciferol (VITAMIN D) 50 MCG (2000 UT) tablet Take 4,000 Units by mouth daily.    [provider]  Continuous Blood Gluc Sensor (FREESTYLE LIBRE 14 DAY SENSOR) MISC UAD to monitor sugars  E10.9 08/07/21   Burns, Claudina Lick, MD  Cyanocobalamin (VITAMIN B-12 IJ) Inject 1,000 mcg as directed every 30 (thirty) days.    [provider]  diltiazem (CARDIZEM CD) 240 MG 24 hr capsule Take 1 capsule (240 mg total) by mouth daily. 07/17/21   Binnie Rail, MD  fluticasone (FLONASE) 50  MCG/ACT nasal spray SPRAY 2 SPRAYS INTO EACH NOSTRIL EVERY DAY    [provider]  Fluticasone-Umeclidin-Vilant (TRELEGY ELLIPTA) 200-62.5-25 MCG/ACT AEPB Inhale 1 puff into the lungs daily at 6 (six) AM. Patient taking differently: Inhale 1 puff into the lungs daily at 6 (six) AM. As needed 05/01/21   Parrett, Tammy S, NP  furosemide (LASIX) 40 MG tablet Take 1 tablet (40 mg total) by mouth daily. 10/05/21 01/03/22  Binnie Rail, MD  insulin lispro protamine-insulin lispro (HUMALOG 75/25) (75-25) 100 UNIT/ML SUSP Inject 15-20 Units into the skin See admin instructions. Inject 15 units breakfast and 20 units with dinner. May inject 5 units with lunch if blood sugar is high    [provider]  isosorbide mononitrate (IMDUR) 60 MG 24 hr tablet Take one tablet ( 60 mg ) twice daily 8 hours apart. Patient taking differently: 60 mg 2 (two) times daily. Take one tablet ( 60 mg ) twice daily 8 hours apart. 05/14/20   Richardson Dopp T, PA-C  meclizine (ANTIVERT) 25 MG tablet TAKE 1 TABLET (25 MG TOTAL) BY MOUTH 2 (TWO) TIMES DAILY AS NEEDED FOR DIZZINESS.    [provider]  metoprolol succinate (TOPROL-XL) 100 MG 24 hr tablet TAKE 1 TABLET BY MOUTH EVERY DAY 02/23/21   Nahser, Wonda Cheng, MD  montelukast (SINGULAIR) 5 MG chewable tablet CHEW 1 TABLET (5 MG TOTAL) BY MOUTH AT BEDTIME.    [provider]  Multiple Vitamin  (MULTIVITAMIN WITH MINERALS) TABS Take 1 tablet by mouth daily.    [provider]  Nebulizers (COMPRESSOR/NEBULIZER) MISC Use with albuterol 04/24/14   Elsie Stain, MD  nitroGLYCERIN (NITROSTAT) 0.4 MG SL tablet Place 1 tablet (0.4 mg total) under the tongue every 5 (five) minutes as needed for chest pain. 07/22/21   Belva Crome, MD  ONE TOUCH ULTRA TEST test strip 1 each by Other route 2 (two) times daily. 03/26/15   [provider]  [facility] DELICA LANCETS 82L MISC 3 (three) times daily. for testing 02/26/15   [provider]  pantoprazole (PROTONIX) 40 MG tablet  01/22/15   [provider]  Spacer/Aero-Holding Chambers (AEROCHAMBER MV) inhaler Use as instructed 08/13/16   Javier Glazier, MD  spironolactone (ALDACTONE) 25 MG tablet TAKE 1 TABLET BY MOUTH EVERY DAY 09/15/21   Nahser, Wonda Cheng, MD  Study - ORION 4 - inclisiran 300 mg/1.22m or placebo SQ injection (PI-Stuckey) Inject 300 mg into the skin every 6 (six) months. 01/17/18   [provider]  tapentadol (NUCYNTA) 50 MG tablet     [provider]  temazepam (RESTORIL) 30 MG capsule Take 30 mg by mouth daily. 09/16/21   [provider]  topiramate (TOPAMAX) 25 MG tablet Take 1 tablet (25 mg total) by mouth at bedtime. 08/19/21   JPieter Partridge DO    Accessory Clinical Findings    None  Assessment & Plan    1.  Preoperative Cardiovascular Risk Assessment:  I made multiple attempts to contact the patient by phone for preop telephone visit.  No answer.  I also contacted patient's daughter who was unable to reach patient by phone.  I will have our preop coverage team reschedule patient's telephone visit.  Time:   Today, I have spent 5 minutes with the patient with telehealth technology discussing medical history, symptoms, and management plan.     ELenna Sciara NP  10/21/2021, 3:16 PM

## 2021-10-21 NOTE — Telephone Encounter (Signed)
Pt has been rescheduled to 10/28/21. Pt missed her call today.

## 2021-10-22 ENCOUNTER — Telehealth: Payer: Self-pay

## 2021-10-22 NOTE — Telephone Encounter (Signed)
   Pre-operative Risk Assessment    Patient Name: [name]  DOB: 1940-09-23 MRN: [id number]      Request for Surgical Clearance    Procedure:   reverse total shoulder  Date of Surgery:  Clearance TBD                                 Surgeon:  Dr Esmond Plants Surgeon's Group or Practice Name:  [facility] Phone number:  [phone] Fax number:  [phone] Vernie Ammons)   Type of Clearance Requested:   - Pharmacy:  Hold Clopidogrel (Plavix)     Type of Anesthesia:  General    Additional requests/questions:   n/a  Job Founds T   10/22/2021, 11:58 AM

## 2021-10-23 NOTE — Telephone Encounter (Signed)
Duplicate request. See documentation from request dated 10/06/21.  Patient requires a virtual visit for preoperative clearance.  She missed her appointment on 9/13 and has been rescheduled for 9/20.

## 2021-10-26 ENCOUNTER — Other Ambulatory Visit: Payer: Self-pay

## 2021-10-26 ENCOUNTER — Emergency Department (HOSPITAL_COMMUNITY): Payer: Medicare Other

## 2021-10-26 ENCOUNTER — Emergency Department (HOSPITAL_COMMUNITY)
Admission: EM | Admit: 2021-10-26 | Discharge: 2021-10-27 | Disposition: A | Discharge status: Home / Self Care | Payer: Medicare Other | Attending: Emergency Medicine | Admitting: Emergency Medicine

## 2021-10-26 ENCOUNTER — Encounter (HOSPITAL_COMMUNITY): Payer: Self-pay

## 2021-10-26 DIAGNOSIS — Z7951 Long term (current) use of inhaled steroids: Secondary | ICD-10-CM | POA: Diagnosis not present

## 2021-10-26 DIAGNOSIS — I2510 Atherosclerotic heart disease of native coronary artery without angina pectoris: Secondary | ICD-10-CM | POA: Diagnosis not present

## 2021-10-26 DIAGNOSIS — I10 Essential (primary) hypertension: Secondary | ICD-10-CM | POA: Diagnosis not present

## 2021-10-26 DIAGNOSIS — Z794 Long term (current) use of insulin: Secondary | ICD-10-CM | POA: Diagnosis not present

## 2021-10-26 DIAGNOSIS — R0602 Shortness of breath: Secondary | ICD-10-CM | POA: Diagnosis not present

## 2021-10-26 DIAGNOSIS — J441 Chronic obstructive pulmonary disease with (acute) exacerbation: Secondary | ICD-10-CM | POA: Insufficient documentation

## 2021-10-26 DIAGNOSIS — R0789 Other chest pain: Secondary | ICD-10-CM | POA: Diagnosis present

## 2021-10-26 DIAGNOSIS — Z951 Presence of aortocoronary bypass graft: Secondary | ICD-10-CM | POA: Diagnosis not present

## 2021-10-26 DIAGNOSIS — Z7982 Long term (current) use of aspirin: Secondary | ICD-10-CM | POA: Diagnosis not present

## 2021-10-26 DIAGNOSIS — R600 Localized edema: Secondary | ICD-10-CM | POA: Diagnosis not present

## 2021-10-26 DIAGNOSIS — E119 Type 2 diabetes mellitus without complications: Secondary | ICD-10-CM | POA: Diagnosis not present

## 2021-10-26 DIAGNOSIS — Z7902 Long term (current) use of antithrombotics/antiplatelets: Secondary | ICD-10-CM | POA: Insufficient documentation

## 2021-10-26 DIAGNOSIS — I252 Old myocardial infarction: Secondary | ICD-10-CM | POA: Insufficient documentation

## 2021-10-26 LAB — BASIC METABOLIC PANEL
Anion gap: 6 (ref 5–15)
BUN: 20 mg/dL (ref 8–23)
CO2: 27 mmol/L (ref 22–32)
Calcium: 9.2 mg/dL (ref 8.9–10.3)
Chloride: 110 mmol/L (ref 98–111)
Creatinine, Ser: 0.93 mg/dL (ref 0.44–1.00)
GFR, Estimated: >60 mL/min (ref >60)
Glucose, Bld: 47 mg/dL — ABNORMAL LOW (ref 70–99)
Potassium: 3.7 mmol/L (ref 3.5–5.1)
Sodium: 143 mmol/L (ref 135–145)

## 2021-10-26 LAB — CBC WITH DIFFERENTIAL/PLATELET
Abs Immature Granulocytes: 0.03 10*3/uL (ref 0.00–0.07)
Basophils Absolute: 0.1 10*3/uL (ref 0.0–0.1)
Basophils Relative: 1 %
Eosinophils Absolute: 0.1 10*3/uL (ref 0.0–0.5)
Eosinophils Relative: 1 %
HCT: 37.3 % (ref 36.0–46.0)
Hemoglobin: 11.4 g/dL — ABNORMAL LOW (ref 12.0–15.0)
Immature Granulocytes: 0 %
Lymphocytes Relative: 34 %
Lymphs Abs: 2.6 10*3/uL (ref 0.7–4.0)
MCH: 27.8 pg (ref 26.0–34.0)
MCHC: 30.6 g/dL (ref 30.0–36.0)
MCV: 91 fL (ref 80.0–100.0)
Monocytes Absolute: 0.5 10*3/uL (ref 0.1–1.0)
Monocytes Relative: 7 %
Neutro Abs: 4.5 10*3/uL (ref 1.7–7.7)
Neutrophils Relative %: 57 %
Platelets: 293 10*3/uL (ref 150–400)
RBC: 4.1 MIL/uL (ref 3.87–5.11)
RDW: 15.2 % (ref 11.5–15.5)
WBC: 7.8 10*3/uL (ref 4.0–10.5)
nRBC: 0 % (ref 0.0–0.2)

## 2021-10-26 LAB — TROPONIN I (HIGH SENSITIVITY): Troponin I (High Sensitivity): 6 ng/L (ref <18)

## 2021-10-26 NOTE — ED Provider Triage Note (Signed)
Emergency Medicine Provider Triage Evaluation Note  [name] , a 81 y.o. female  was evaluated in triage.  Pt complains of chest pain.  History of CABG.  Goes to the left arm.  Feels short of breath.  No lower extremity pain or swelling. Cough is non productive  Review of Systems  Positive: CP, SOB Negative: Fever, le swelling  Physical Exam  BP 128/70 (BP Location: Right Arm)   Pulse 70   Resp 18   Ht '5\' 2"'$  (1.575 m)   Wt 79.8 kg   LMP  (LMP Unknown)   SpO2 94%   BMI 32.19 kg/m  Gen:   Awake, no distress   Resp:  Normal effort  MSK:   Moves extremities without difficulty  Other:    Medical Decision Making  Medically screening exam initiated at 4:46 PM.  Appropriate orders placed.  [name] was informed that the remainder of the evaluation will be completed by another provider, this initial triage assessment does not replace that evaluation, and the importance of remaining in the ED until their evaluation is complete.  CP, SOB   Kathyann Spaugh A, PA-C 10/26/21 1649

## 2021-10-26 NOTE — ED Triage Notes (Signed)
Reports chest pain x 1 month hx of CABG reports today it was worse and wouldn't go away and is going in to her left jaw and left arm.  +SOB +nausea

## 2021-10-27 LAB — CBG MONITORING, ED: Glucose-Capillary: 111 mg/dL — ABNORMAL HIGH (ref 70–99)

## 2021-10-27 LAB — BRAIN NATRIURETIC PEPTIDE: B Natriuretic Peptide: 275.1 pg/mL — ABNORMAL HIGH (ref 0.0–100.0)

## 2021-10-27 LAB — D-DIMER, QUANTITATIVE: D-Dimer, Quant: 0.56 ug/mL-FEU — ABNORMAL HIGH (ref 0.00–0.50)

## 2021-10-27 LAB — TROPONIN I (HIGH SENSITIVITY): Troponin I (High Sensitivity): 6 ng/L (ref <18)

## 2021-10-27 MED ORDER — ASPIRIN 325 MG PO TABS
325.0000 mg | ORAL_TABLET | Freq: Every day | ORAL | Status: DC
  Administered 2021-10-27: 325 mg via ORAL
  Filled 2021-10-27: qty 1

## 2021-10-27 MED ORDER — LIDOCAINE 5 % EX PTCH
1.0000 | MEDICATED_PATCH | CUTANEOUS | Status: DC
  Administered 2021-10-27: 1 via TRANSDERMAL
  Filled 2021-10-27: qty 1

## 2021-10-27 MED ORDER — ACETAMINOPHEN 500 MG PO TABS
1000.0000 mg | ORAL_TABLET | ORAL | Status: AC
  Administered 2021-10-27: 1000 mg via ORAL
  Filled 2021-10-27: qty 2

## 2021-10-27 NOTE — Discharge Instructions (Addendum)
Today you were seen in the emergency department for your chest pain.    In the emergency department you had an EKG, chest x-ray, and troponins that were reassuring.    At home, please take Tylenol and over-the-counter lidocaine patches as needed for your chest pain.    Follow-up with your primary doctor in 2-3 days regarding your visit.  We have also placed a cardiology referral for you to follow-up with them.  Return immediately to the emergency department if you experience any of the following: Worsening chest pain, shortness of breath, or any other concerning symptoms.    Thank you for visiting our Emergency Department. It was a pleasure taking care of you today.

## 2021-10-27 NOTE — ED Provider Notes (Signed)
[facility] EMERGENCY DEPARTMENT Provider Note   CSN: [id number] Arrival date & time: 10/26/21  1639     History  Chief Complaint  Patient presents with   Chest Pain    [name] is a 81 y.o. female.  81 year old female with a history of CAD status post CABG on aspirin and Plavix, RBBB, heart failure with preserved ejection fraction, HTN, HLD, insulin-dependent diabetes, and COPD who presents to the emergency department with chest discomfort and shortness of breath.  Patient states that her chest pain started 2 weeks ago and has been constant.  Described it as a sensation that someone was pushing on her chest initially but then started to become pressure-like today so she presented to the emergency department for evaluation.  Says that she has also had radiation to her left jaw and left arm.  Denies exertional component of the chest pain. No diaphoresis or vomiting. Says that she is having mild dyspnea on exertion which is new.  Also reports a dry cough but denies any fevers, runny nose, or sore throat.  Says that she has had mild bilateral lower extremity swelling.  Has been compliant with her aspirin and Plavix.   Was noted to be hypoglycemic in triage and reports that she takes Humalog 20 units twice daily and due to long wait times is not able to eat recently.  Has not yet taken her aspirin and Plavix for today.      Home Medications Prior to Admission medications   Medication Sig Start Date End Date Taking? Authorizing Provider  acetaminophen (TYLENOL) 325 MG tablet Take 650 mg by mouth every 6 (six) hours as needed for moderate pain or headache.   Yes [provider]  albuterol (PROVENTIL HFA;VENTOLIN HFA) 108 (90 BASE) MCG/ACT inhaler Inhale 2 puffs into the lungs every 6 (six) hours as needed. For shortness of breath. 03/06/12  Yes Elsie Stain, MD  albuterol (PROVENTIL) (2.5 MG/3ML) 0.083% nebulizer solution USE 1 VIAL IN NEBULIZER 4 TIMES  DAILY Patient taking differently: Take 2.5 mg by nebulization every 6 (six) hours as needed for wheezing or shortness of breath. 05/07/20  Yes Mannam, Praveen, MD  alum & mag hydroxide-simeth (MAALOX/MYLANTA) 200-200-20 MG/5ML suspension Take 30 mLs as needed by mouth for indigestion or heartburn.   Yes [provider]  aspirin EC 81 MG tablet Take 1 tablet (81 mg total) by mouth daily. 12/10/16  Yes Weaver, Scott T, PA-C  baclofen (LIORESAL) 20 MG tablet TAKE 2 TABLETS BY MOUTH 2 TIMES DAILY. Patient taking differently: Take 40 mg by mouth 2 (two) times daily. 03/13/21  Yes Burns, Claudina Lick, MD  Bempedoic Acid-Ezetimibe (NEXLIZET) 180-10 MG TABS Take 1 tablet by mouth daily. 08/26/21  Yes Weaver, Scott T, PA-C  calcium carbonate (OS-CAL) 1250 (500 Ca) MG chewable tablet Chew 500 mg by mouth daily as needed for heartburn.   Yes [provider]  Cholecalciferol (VITAMIN D) 50 MCG (2000 UT) tablet Take 4,000 Units by mouth daily.   Yes [provider]  Cyanocobalamin (VITAMIN B-12 IJ) Inject 1,000 mcg as directed every 30 (thirty) days.   Yes [provider]  fluticasone (FLONASE) 50 MCG/ACT nasal spray Place 2 sprays into both nostrils daily.   Yes [provider]  Fluticasone-Umeclidin-Vilant (TRELEGY ELLIPTA) 200-62.5-25 MCG/ACT AEPB Inhale 1 puff into the lungs daily at 6 (six) AM. 05/01/21  Yes Parrett, Tammy S, NP  furosemide (LASIX) 40 MG tablet Take 1 tablet (40 mg total) by  mouth daily. 10/05/21 01/03/22 Yes Burns, Claudina Lick, MD  insulin lispro protamine-insulin lispro (HUMALOG 75/25) (75-25) 100 UNIT/ML SUSP Inject 20 Units into the skin 2 (two) times daily with a meal.   Yes [provider]  isosorbide mononitrate (IMDUR) 60 MG 24 hr tablet Take one tablet ( 60 mg ) twice daily 8 hours apart. Patient taking differently: 60 mg 2 (two) times daily. Take one tablet ( 60 mg ) twice daily 8 hours apart. 05/14/20  Yes Weaver, Scott T, PA-C  meclizine  (ANTIVERT) 25 MG tablet Take 25 mg by mouth 2 (two) times daily as needed for dizziness.   Yes [provider]  metoprolol succinate (TOPROL-XL) 100 MG 24 hr tablet TAKE 1 TABLET BY MOUTH EVERY DAY Patient taking differently: Take 100 mg by mouth daily. 02/23/21  Yes Nahser, Wonda Cheng, MD  montelukast (SINGULAIR) 5 MG chewable tablet Chew 5 mg by mouth at bedtime.   Yes [provider]  Multiple Vitamin (MULTIVITAMIN WITH MINERALS) TABS Take 1 tablet by mouth daily.   Yes [provider]  nitroGLYCERIN (NITROSTAT) 0.4 MG SL tablet Place 1 tablet (0.4 mg total) under the tongue every 5 (five) minutes as needed for chest pain. 07/22/21  Yes Belva Crome, MD  pantoprazole (PROTONIX) 40 MG tablet Take 40 mg by mouth daily. 01/22/15  Yes [provider]  spironolactone (ALDACTONE) 25 MG tablet TAKE 1 TABLET BY MOUTH EVERY DAY 09/15/21  Yes Nahser, Wonda Cheng, MD  Study - ORION 4 - inclisiran 300 mg/1.93m or placebo SQ injection (PI-Stuckey) Inject 300 mg into the skin every 6 (six) months. 01/17/18  Yes [provider]  temazepam (RESTORIL) 30 MG capsule Take 30 mg by mouth daily. 09/16/21  Yes [provider]  topiramate (TOPAMAX) 25 MG tablet Take 1 tablet (25 mg total) by mouth at bedtime. 08/19/21  Yes Jaffe, Adam R, DO  Continuous Blood Gluc Sensor (FREESTYLE LIBRE 14 DAY SENSOR) MISC UAD to monitor sugars  E10.9 08/07/21   BBinnie Rail MD  diltiazem (CARDIZEM CD) 240 MG 24 hr capsule Take 1 capsule (240 mg total) by mouth daily. Patient not taking: Reported on 10/27/2021 07/17/21   BBinnie Rail MD  tapentadol (NUCYNTA) 50 MG tablet     [provider]      Allergies    Amlodipine, Banana, Co q10 [coenzyme q10], Codeine, Insulin aspart, Lisinopril, Metformin and related, Morphine, Pentazocine, Pravastatin, Repatha [evolocumab], Statins, Sulfa antibiotics, Sulfonamide derivatives, and Tramadol hcl    Review of Systems   Review of  Systems  Physical Exam Updated Vital Signs BP (!) 153/73   Pulse 84   Temp 98.6 F (37 C)   Resp 14   Ht '5\' 2"'$  (1.575 m)   Wt 79.8 kg   LMP  (LMP Unknown)   SpO2 100%   BMI 32.19 kg/m  Physical Exam Vitals and nursing note reviewed.  Constitutional:      General: She is not in acute distress.    Appearance: She is well-developed.  HENT:     Head: Normocephalic and atraumatic.     Right Ear: External ear normal.     Left Ear: External ear normal.     Nose: Nose normal.  Eyes:     Extraocular Movements: Extraocular movements intact.     Conjunctiva/sclera: Conjunctivae normal.     Pupils: Pupils are equal, round, and reactive to light.  Cardiovascular:     Rate and Rhythm: Normal rate and regular  rhythm.     Heart sounds: No murmur heard.    Comments: Sternotomy scar present.  Reproducible chest pain. Pulmonary:     Effort: Pulmonary effort is normal. No respiratory distress.     Breath sounds: Normal breath sounds.  Abdominal:     General: Abdomen is flat. There is no distension.     Palpations: Abdomen is soft. There is no mass.     Tenderness: There is no abdominal tenderness. There is no guarding.  Musculoskeletal:        General: No swelling.     Cervical back: Normal range of motion and neck supple.     Right lower leg: Edema (Trace) present.     Left lower leg: Edema (Trace) present.  Skin:    General: Skin is warm and dry.     Capillary Refill: Capillary refill takes less than 2 seconds.  Neurological:     Mental Status: She is alert and oriented to person, place, and time. Mental status is at baseline.  Psychiatric:        Mood and Affect: Mood normal.     ED Results / Procedures / Treatments   Labs (all labs ordered are listed, but only abnormal results are displayed) Labs Reviewed  CBC WITH DIFFERENTIAL/PLATELET - Abnormal; Notable for the following components:      Result Value   Hemoglobin 11.4 (*)    All other components within normal limits   BASIC METABOLIC PANEL - Abnormal; Notable for the following components:   Glucose, Bld 47 (*)    All other components within normal limits  BRAIN NATRIURETIC PEPTIDE - Abnormal; Notable for the following components:   B Natriuretic Peptide 275.1 (*)    All other components within normal limits  D-DIMER, QUANTITATIVE - Abnormal; Notable for the following components:   D-Dimer, Quant 0.56 (*)    All other components within normal limits  CBG MONITORING, ED - Abnormal; Notable for the following components:   Glucose-Capillary 111 (*)    All other components within normal limits  TROPONIN I (HIGH SENSITIVITY)  TROPONIN I (HIGH SENSITIVITY)    EKG EKG Interpretation  Date/Time:  Monday October 26 2021 16:58:06 EDT Ventricular Rate:  63 PR Interval:  122 QRS Duration: 142 QT Interval:  474 QTC Calculation: 485 R Axis:   15 Text Interpretation: Normal sinus rhythm Right bundle branch block Abnormal ECG When compared with ECG of 24-Aug-2020 14:55, NSR now present. No ischemic changes noted. RBBB present on prior. Confirmed by Margaretmary Eddy [phone]) on 10/27/2021 7:46:49 AM  Radiology DG Chest 2 View  Result Date: 10/26/2021 CLINICAL DATA:  Chest pain.  Shortness of breath. EXAM: CHEST - 2 VIEW COMPARISON:  05/01/2021 FINDINGS: Prior median sternotomy. The heart is upper normal in size. Stable mediastinal contours. No pulmonary edema, focal airspace disease, pleural effusion or pneumothorax. Reverse right shoulder arthroplasty. On limited assessment, no acute osseous findings. IMPRESSION: No acute chest findings. Electronically Signed   By: Keith Rake M.D.   On: 10/26/2021 18:19    Procedures Procedures   Medications Ordered in ED Medications  acetaminophen (TYLENOL) tablet 1,000 mg (1,000 mg Oral Given 10/27/21 0816)    ED Course/ Medical Decision Making/ A&P Clinical Course as of 10/27/21 1938  Tue Oct 27, 2021  0859 D-Dimer, Quant(!): 0.56 WNL of age adjusted values.   [RP]  8588 B Natriuretic Peptide(!): 275.1 Lower than prior. [RP]    Clinical Course User Index [RP] Fransico Meadow, MD  Medical Decision Making Amount and/or Complexity of Data Reviewed Labs: ordered. Decision-making details documented in ED Course.  Risk OTC drugs. Prescription drug management.   LYNNEA VANDERVOORT is a 81 year old female with a history of CAD status post CABG on aspirin and Plavix, RBBB, heart failure with preserved ejection fraction, HTN, HLD, insulin-dependent diabetes, and COPD who presents to the emergency department with chest discomfort and shortness of breath.   Initial Ddx:  MI, PE, pneumonia, anemia, COPD exacerbation  MDM:  Initially was concerned about MI given the patient's past medical history and symptoms.  Also considered pulmonary embolism given her dyspnea on exertion.  Patient without bleeding so feel that anemia is unlikely but will obtain a CBC.  Considered COPD exacerbation but patient is not having wheezing at this time.  Plan:  Labs Serial troponins D-dimer Chest x-ray EKG Tylenol Lidocaine patch  ED Summary:  Patient is EKG did not show new ischemic changes.  Serial troponins were drawn at 1646 on 9/18 and 803 on 9/19 due to long wait times and were stable at 6.  Given the extended period of time that these high-sensitivity troponins were drawn and duration of the patient's symptoms feel that MI is very unlikely at this time.  Chest x-ray did not show any evidence of a pneumonia.  D-dimer was within age-adjusted limits.  Given her reassuring evaluation today do not feel that admission is warranted at this time but do feel that she should follow-up with cardiology so an ambulatory referral was placed for her.  Also informed her that she should follow-up with her primary doctor regarding her pain.  Dispo: DC Home. Return precautions discussed including, but not limited to, those listed in the AVS. Allowed pt time  to ask questions which were answered fully prior to dc.   Records reviewed Care Everywhere The following labs were independently interpreted: Chemistry and Serial Troponins I independently visualized the following imaging with scope of interpretation limited to determining acute life threatening conditions related to emergency care: Chest x-ray, which revealed no acute abnormality   Final Clinical Impression(s) / ED Diagnoses Final diagnoses:  Chest wall pain  History of myocardial infarction    Rx / DC Orders ED Discharge Orders          Ordered    Ambulatory referral to Cardiology        10/27/21 1310              Fransico Meadow, MD 10/27/21 [phone]

## 2021-10-27 NOTE — Inpatient Diabetes Management (Signed)
Inpatient Diabetes Program Recommendations  AACE/ADA: New Consensus Statement on Inpatient Glycemic Control (2015)  Target Ranges:  Prepandial:   less than 140 mg/dL      Peak postprandial:   less than 180 mg/dL (1-2 hours)      Critically ill patients:  140 - 180 mg/dL   Lab Results  Component Value Date   GLUCAP 111 (H) 10/27/2021   HGBA1C 8.7 (H) 10/05/2021    Review of Glycemic Control  Diabetes history: DM 2 Outpatient Diabetes medications: 75/25 15 units qam, 20 units qpm, 5 units at lunch if glucose elevated Current orders for Inpatient glycemic control:  None being evaluated in ED Glucose trends good for now at 111  A1c 8.7% on 10/05/21  Inpatient Diabetes Program Recommendations:    -  add CBGs and Novolog 0-9 units Q4 hours if NPO.  Thanks,  Tama Headings RN, MSN, BC-ADM Inpatient Diabetes Coordinator Team Pager [phone] (8a-5p)

## 2021-10-28 ENCOUNTER — Telehealth: Payer: Self-pay

## 2021-10-28 ENCOUNTER — Ambulatory Visit: Payer: Medicare Other | Admitting: Physician Assistant

## 2021-10-28 ENCOUNTER — Telehealth: Payer: Self-pay | Admitting: Physician Assistant

## 2021-10-28 NOTE — Telephone Encounter (Signed)
MEDICATION: ONE TOUCH TEST STRIPS   PHARMACY: [id number] [place] - [id number] Comments: Patient is completely out.   **Let patient know to contact pharmacy at the end of the day to make sure medication is ready. **  ** Please notify patient to allow 48-72 hours to process**  **Encourage patient to contact the pharmacy for refills or they can [facility]*

## 2021-10-28 NOTE — Telephone Encounter (Signed)
I s/w the pt and per Melina Copa, [facility] we will cancel the pre op tele appt for today. Pt has been scheduled to see Dr. Acie Fredrickson 10/30/21 @ 2:30 for chest pain and new DOE, pt seen in ER on 10/26/21. Pt thanked me for the help.  Pt has been advised if she has any more chest pain or DOE between now and appt 10/30/21 she is to go to the ED. Pt verbalized understanding.   I will update the requesting office as well.

## 2021-10-28 NOTE — Telephone Encounter (Signed)
   Name: AYDAN PHOENIX  DOB: [birth year]  MRN: [id number]  Primary Cardiologist: Mertie Moores, MD  Chart reviewed as part of pre-operative protocol coverage.  Patient was on virtual schedule today for pre-op clearance as a reschedule from prior virtual call where patient did not answer. In the time that the VV was set up, the patient has since had emergency room visit 10/26/21 for chest pain and new DOE. Troponins negative and BNP elevated. ER recommended to f/u cardiology for evaluation. Therefore, per protocol, needs in-person visit - please cancel virtual visit.    Pre-op covering staff: - Please schedule appointment and call patient to inform them. If patient already had an upcoming appointment within acceptable timeframe, please add "pre-op clearance" to the appointment notes so provider is aware. - Please contact requesting surgeon's office via preferred method (i.e, phone, fax) to inform them of need for appointment prior to surgery. - At [facility] please review phone note 10/06/21 for preop APP input on holding Plavix at that time.  Charlie Pitter, PA-C  10/28/2021, 10:40 AM

## 2021-10-29 ENCOUNTER — Encounter: Payer: Self-pay | Admitting: Cardiovascular Disease

## 2021-10-29 ENCOUNTER — Other Ambulatory Visit: Payer: Self-pay

## 2021-10-29 NOTE — Telephone Encounter (Signed)
Left message for patient today to return call to clinic and let me know how many times a day she is checking her glucose so I know how many test strips to send in.  If she calls back please ask her and let me know her response.

## 2021-10-29 NOTE — Progress Notes (Addendum)
Cardiology Office Note   Date:  10/30/2021   ID:  [name], DOB Jul 22, 1940, MRN [id number]  PCP:  Binnie Rail, MD  Cardiologist:   Mertie Moores, MD   Chief Complaint  Patient presents with   Hypertension        Coronary Artery Disease        Congestive Heart Failure        1. Coronary artery disease-status post CABG, the saphenous vein graft to the right coronary artery is severely diseased but she has good flow to the distal right coronary artery from the native circulation. 2. Diabetes mellitus 3. Hypertension 4. Hyperlipidemia 5. COPD: June, 2013 - FEV1 equals 0.89 L which is 48% of predicted value. Her DLCO is also markedly depressed. 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   [name] is a 81 y.o. female with the above noted hx.  She continues to have episodes of sharp pain ( few seconds)  followed by a burning pain ( last 6-7 minutes).  She usually does not take NTG because it causes a headache.  She was started on Lasix earlier this year. She seems to be doing much better on Lasix. She's had only function test which revealed severe obstructive lung disease with a good initial response to bronchodilators.  She is scheduled to have surgery all right knee. She's here today for preoperative evaluation prior to having the surgery.  September 04, 2012:  [name] is seen today for a follow up visit.  She saw Richardson Dopp in April and was started on bystolic.  She has tolerated that fairly well.    She has some episodes of CP that are constant.    She is very stable.   03/01/2013:  She continues to have significant CP.  Still short of breath.  She has no energy.  Just does not feel well in general.     She does not get much sleep - perhaps 3 hours a night.   Jan. 8, 2016:  [name] is a 81 yo who we follow for CAD, DM, HTN, hyperlipidemia, .  She has COPD:  FEV1 equals 0.89 L which is 48% of predicted value. Her DLCO is also  markedly depressed. 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  Breathing is the same.  Has some upper respiratory issues.   Still has some tightness.    Jun 17, 2014:    [name] is a 81 y.o. female who presents for episodes of CP    Started 1 1/2 months ago.  Has tried mylanta - thought it was gas.  Did not get any better Occurs wjith exertion or at rest. , left sided chest pain   No radiation Gets lightheaded. Has tried NTG which seems to help.    Nov. 15 2016:  Has not been feeling well for the past couple of months Has a chest pressure when she gets out of bed.  Occurs twice a day - in the AM when she gets up  And then again at 3:30 in the afternoon.   Is active between those times , makes home visit.   Does water aerobics without any CP / pressure.   Wears home O2 at night.     Jun 23, 2015: Doing the same.  Still has the same chest pain  Last cath was in 2014 - showed patent LIMA to LAD, SVG to D1, the SVG to  RCA was occluded   Native RCA has a 40-50% prox stenosis   The pains occur typically twice a day - Upon awakening and around 7 PM.   Nov. 20, 2017:  Has had lots of fatigue / pressure in her chest . Increased shortness of breath walking a short distance  Has COPD - had issues with bronchitis ,  Was on prednisone  Pain was relieved with NTG and also by taking additional aspirin   April 08, 2016:  She has had some GI bleeding from diverticulitis  Was in the hospital for 4 days  Has been seeing Dr. Fuller Plan.   Aug. 14, 2018: [name] is seen today for follow up    GI bleeding has resolved  Breathing is still an issue.  Feet are swollen by the end of the day  ( no swelling this am )   Jun 24, 2017:  Her BP has been variable  Her dyspnea has been worse recently . Having trouble sleeping at night.   Has been given home O2 .   Never arrived   Feb. 9, 2021  Was sick for 2 weeks .  She tested negative for covid 5  days ago .     She is getting better. Son was recently diagnosed with COVID.   She is concerned that she may have it despite her negative test 5 days ago    She did not lose her taste or smell  Jan. 3, 2022:  [name] is seen today for follow up of her CAD She has severe COPD and severely reduced diffusion capacity . She lost her son to Dent last march. Encouraged her to get some grief counceling   Still has some chest pain - seems to be related to the stress of the death of her son . The pains last 10-15 minutes  She is on Imdur.   Takes NTG on occasion.  Also has some sternal wire pain / positional pain   Sept. 22, 2023 [name] is seen for follow up of her COPD, Severely reduced diffusion capacity Chronic chest pain   Recent cath  07/28/21 LM - normal LAD - prox 60%, D1 - 100 Lcx - normal RCA 25% prox  LIMA to LAD - patent SVG to D1 patent  SVG to PDA - occluded   LV - 60%,   LVEDP is 16 mmHg  Has continue to have CP and dyspnea .  Some relief with lidocaine patches   Past several days has had nausea, diarrhea No COVID symptoms Has a cough but not congested.   She has known severe reduction in her DLCO  Will update echo given her symptoms of CP, dyspnea.   She needs to have shoulder replacement . She is at low risk for her upcoming shoulder replacement surgery .  Ok for her to hold her ASA for 5-7 days prior to surgery.    Past Medical History:  Diagnosis Date   Adenomatous colon polyp    Allergy    Anxiety    Asthma    Carotid artery disease ([place])    carotid US 02/2017: bilat ICA 1-39%   Chronic diastolic CHF    Echo 10/8919: EF 65-70, Gr 2 DD, mild MS (mean 5), PASP 44   COPD    pt is unsure if has been officially diagnosed   Coronary artery disease    CABG '09- cathed 12/09, 9/10, 6/11, 3/14 and 12/13/16- medical Rx // cath 12/2016 - 2/3 grafts patent >> med  Rx // Myoview 12/17: low risk   Diabetes mellitus    Gastroesophageal reflux disease    Hiatal  hernia    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   Hyperlipidemia    Hypertension    PONV (postoperative nausea and vomiting)    Schatzki's ring    Shoulder injury    resolved after shoulder surgery   Sleep apnea    not on cpap    Past Surgical History:  Procedure Laterality Date   ABDOMINAL HYSTERECTOMY     APPENDECTOMY     came out with Hysterectomy   CARDIAC CATHETERIZATION  07/23/2009   EF 60%   CARDIAC CATHETERIZATION  10/11/2008   CARDIAC CATHETERIZATION  03/01/2007   EF 75-80%   CARDIOVASCULAR STRESS TEST  11/15/2007   EF 60%   COLONOSCOPY     CORONARY ARTERY BYPASS GRAFT     SEVERELY DISEASED SAPHENOUS VEIN GRAFT TO THE RIGHT CORONARY ARTERY BUT WITH FAIRLY WELL PRESERVED FLOW TO THE DISTAL RIGHT CORONARY ARTERY FROM THE NATIVE CIRCULATION-RESTART  CATH IN JUNE 2000, REVEALS MILD/MODERATE  CAD WITH GOOD FLOW DOWN HER LAD   ESOPHAGOGASTRODUODENOSCOPY     EYE SURGERY     bilateral cataract surgery with lens implant   LEFT HEART CATH AND CORS/GRAFTS ANGIOGRAPHY N/A 12/14/2016   Procedure: LEFT HEART CATH AND CORS/GRAFTS ANGIOGRAPHY;  Surgeon: Jettie Booze, MD;  Location: [facility];  Service: Cardiovascular;  Laterality: N/A;   lense removal Left    POLYPECTOMY     RIGHT/LEFT HEART CATH AND CORONARY/GRAFT ANGIOGRAPHY N/A 07/28/2021   Procedure: RIGHT/LEFT HEART CATH AND CORONARY/GRAFT ANGIOGRAPHY;  Surgeon: Troy Sine, MD;  Location: [facility];  Service: Cardiovascular;  Laterality: N/A;   ROTATOR CUFF REPAIR     right and left   TONSILLECTOMY     age 40   TOTAL KNEE ARTHROPLASTY Right 02/20/2014   Procedure: RIGHT TOTAL KNEE ARTHROPLASTY;  Surgeon: Tobi Bastos, MD;  Location: [facility];  Service: Orthopedics;  Laterality: Right;   tumor removed kidney     UPPER GASTROINTESTINAL ENDOSCOPY     US ECHOCARDIOGRAPHY  03/08/2008   EF 55-60%     Current Outpatient Medications  Medication Sig Dispense Refill   acetaminophen (TYLENOL) 325 MG  tablet Take 650 mg by mouth every 6 (six) hours as needed for moderate pain or headache.     albuterol (PROVENTIL HFA;VENTOLIN HFA) 108 (90 BASE) MCG/ACT inhaler Inhale 2 puffs into the lungs every 6 (six) hours as needed. For shortness of breath. 1 Inhaler 0   albuterol (PROVENTIL) (2.5 MG/3ML) 0.083% nebulizer solution USE 1 VIAL IN NEBULIZER 4 TIMES DAILY (Patient taking differently: Take 2.5 mg by nebulization every 6 (six) hours as needed for wheezing or shortness of breath.) 360 mL 1   alum & mag hydroxide-simeth (MAALOX/MYLANTA) 200-200-20 MG/5ML suspension Take 30 mLs as needed by mouth for indigestion or heartburn.     aspirin EC 81 MG tablet Take 1 tablet (81 mg total) by mouth daily.     baclofen (LIORESAL) 20 MG tablet TAKE 2 TABLETS BY MOUTH 2 TIMES DAILY. (Patient taking differently: Take 40 mg by mouth 2 (two) times daily.) 360 tablet 2   Bempedoic Acid-Ezetimibe (NEXLIZET) 180-10 MG TABS Take 1 tablet by mouth daily. 90 tablet 3   calcium carbonate (OS-CAL) 1250 (500 Ca) MG chewable tablet Chew 500 mg by mouth daily as needed for heartburn.     Cholecalciferol (VITAMIN D)  50 MCG (2000 UT) tablet Take 4,000 Units by mouth daily.     Continuous Blood Gluc Sensor (FREESTYLE LIBRE 14 DAY SENSOR) MISC UAD to monitor sugars  E10.9 9 each 3   Cyanocobalamin (VITAMIN B-12 IJ) Inject 1,000 mcg as directed every 30 (thirty) days.     diltiazem (CARDIZEM CD) 240 MG 24 hr capsule Take 1 capsule (240 mg total) by mouth daily. 90 capsule 2   fluticasone (FLONASE) 50 MCG/ACT nasal spray Place 2 sprays into both nostrils daily.     Fluticasone-Umeclidin-Vilant (TRELEGY ELLIPTA) 200-62.5-25 MCG/ACT AEPB Inhale 1 puff into the lungs daily at 6 (six) AM. 1 each 0   furosemide (LASIX) 40 MG tablet Take 1 tablet (40 mg total) by mouth daily. 30 tablet    insulin lispro protamine-insulin lispro (HUMALOG 75/25) (75-25) 100 UNIT/ML SUSP Inject 20 Units into the skin 2 (two) times daily with a meal.      isosorbide mononitrate (IMDUR) 60 MG 24 hr tablet Take one tablet ( 60 mg ) twice daily 8 hours apart. (Patient taking differently: 60 mg 2 (two) times daily. Take one tablet ( 60 mg ) twice daily 8 hours apart.) 180 tablet 3   meclizine (ANTIVERT) 25 MG tablet Take 25 mg by mouth 2 (two) times daily as needed for dizziness.     metoprolol succinate (TOPROL-XL) 100 MG 24 hr tablet TAKE 1 TABLET BY MOUTH EVERY DAY (Patient taking differently: Take 100 mg by mouth daily.) 90 tablet 3   montelukast (SINGULAIR) 5 MG chewable tablet Chew 5 mg by mouth at bedtime.     Multiple Vitamin (MULTIVITAMIN WITH MINERALS) TABS Take 1 tablet by mouth daily.     nitroGLYCERIN (NITROSTAT) 0.4 MG SL tablet Place 1 tablet (0.4 mg total) under the tongue every 5 (five) minutes as needed for chest pain. 25 tablet 3   pantoprazole (PROTONIX) 40 MG tablet Take 40 mg by mouth daily.     spironolactone (ALDACTONE) 25 MG tablet TAKE 1 TABLET BY MOUTH EVERY DAY 90 tablet 3   Study - ORION 4 - inclisiran 300 mg/1.53m or placebo SQ injection (PI-Stuckey) Inject 300 mg into the skin every 6 (six) months.     tapentadol (NUCYNTA) 50 MG tablet      temazepam (RESTORIL) 30 MG capsule Take 30 mg by mouth daily.     topiramate (TOPAMAX) 25 MG tablet Take 1 tablet (25 mg total) by mouth at bedtime. 30 tablet 5   Current Facility-Administered Medications  Medication Dose Route Frequency Provider Last Rate Last Admin   cyanocobalamin ((VITAMIN B-12)) injection 1,000 mcg  1,000 mcg Intramuscular Q30 days BBinnie Rail MD   1,000 mcg at 03/26/16 1658    Allergies:   Amlodipine, Banana, Co q10 [coenzyme q10], Codeine, Insulin aspart, Lisinopril, Metformin and related, Morphine, Pentazocine, Pravastatin, Repatha [evolocumab], Statins, Sulfa antibiotics, Sulfonamide derivatives, and Tramadol hcl    Social History:  The patient  reports that she quit smoking about 45 years ago. Her smoking use included cigarettes. She started smoking  about 66 years ago. She has a 10.50 pack-year smoking history. She has never used smokeless tobacco. She reports that she does not drink alcohol and does not use drugs.   Family History:  The patient's family history includes Diabetes in her maternal grandfather and mother; Emphysema in her paternal aunt and paternal uncle; Esophageal cancer (age of onset: 374 in her brother; Healthy in her child; Heart attack in her father; Heart disease in her maternal grandfather;  Heart failure in her maternal grandfather; Rheum arthritis in her sister.    ROS:   Noted in current history, all other systems are negative.  Physical Exam: Blood pressure 110/60, pulse 65, height '5\' 2"'$  (1.575 m), weight 176 lb (79.8 kg), SpO2 96 %.       GEN:  elderly female in no acute distress HEENT: Normal NECK: No JVD; No carotid bruits LYMPHATICS: No lymphadenopathy CARDIAC: RRR , no murmurs, rubs, gallops RESPIRATORY:  Clear to auscultation without rales, wheezing or rhonchi  ABDOMEN: Soft, non-tender, non-distended MUSCULOSKELETAL:  No edema; No deformity  SKIN: Warm and dry NEUROLOGIC:  Alert and oriented x 3     EKG:      Recent Labs: 03/16/2021: TSH 1.13 07/22/2021: NT-Pro BNP 574 10/05/2021: ALT 11; Magnesium 2.1 10/26/2021: BUN 20; Creatinine, Ser 0.93; Hemoglobin 11.4; Platelets 293; Potassium 3.7; Sodium 143 10/27/2021: B Natriuretic Peptide 275.1    Lipid Panel    Component Value Date/Time   CHOL 108 10/21/2020 0843   TRIG 125 10/21/2020 0843   HDL 27 (L) 10/21/2020 0843   CHOLHDL 4.0 10/21/2020 0843   CHOLHDL 6 12/19/2019 1437   VLDL 32.4 12/19/2019 1437   LDLCALC 58 10/21/2020 0843      Wt Readings from Last 3 Encounters:  10/30/21 176 lb (79.8 kg)  10/26/21 176 lb (79.8 kg)  10/06/21 176 lb 9.6 oz (80.1 kg)      Other studies Reviewed: Additional studies/ records that were reviewed today include: . Review of the above records demonstrates:    ASSESSMENT AND PLAN:  1. Coronary  artery disease-  has stable CAD .  Cath unchanged from her previous cath .    3. Hypertension -    BP is controlled.   4. Hyperlipidemia -    5. COPD: June, 2013 -    I suspect her worseiing dyspena and cp are due to her lung disease  6.  Pre op for shoulder surgery .   She needs to have shoulder replacement . She is at low risk for her upcoming shoulder replacement surgery .  Ok for her to hold her ASA for 5-7 days prior to surgery.   Current medicines are reviewed at length with the patient today.  The patient does not have concerns regarding medicines.  The following changes have been made:  no change  Labs/ tests ordered today include:   Orders Placed This Encounter  Procedures   ECHOCARDIOGRAM COMPLETE     Disposition:    To see scott in several months      Mertie Moores, MD  10/30/2021 3:26 PM    [address], [place], [place]  [address] Phone: [phone]; Fax: [phone]

## 2021-10-30 ENCOUNTER — Ambulatory Visit: Payer: Medicare Other | Attending: Cardiovascular Disease | Admitting: Cardiovascular Disease

## 2021-10-30 VITALS — BP 110/60 | HR 65 | Ht 62.0 in | Wt 176.0 lb

## 2021-10-30 DIAGNOSIS — R0602 Shortness of breath: Secondary | ICD-10-CM | POA: Diagnosis not present

## 2021-10-30 DIAGNOSIS — R079 Chest pain, unspecified: Secondary | ICD-10-CM | POA: Diagnosis not present

## 2021-10-30 DIAGNOSIS — I2510 Atherosclerotic heart disease of native coronary artery without angina pectoris: Secondary | ICD-10-CM

## 2021-10-30 NOTE — Patient Instructions (Signed)
Medication Instructions:  Your physician recommends that you continue on your current medications as directed. Please refer to the Current Medication list given to you today.  *If you need a refill on your cardiac medications before your next appointment, please call your pharmacy*   Lab Work: NONE If you have labs (blood work) drawn today and your tests are completely normal, you will receive your results only by: [place] (if you have MyChart) OR A paper copy in the mail If you have any lab test that is abnormal or we need to change your treatment, we will call you to review the results.   Testing/Procedures: ECHO Your physician has requested that you have an echocardiogram. Echocardiography is a painless test that uses sound waves to create images of your heart. It provides your doctor with information about the size and shape of your heart and how well your heart's chambers and valves are working. This procedure takes approximately one hour. There are no restrictions for this procedure.   Follow-Up: At [facility], you and your health needs are our priority.  As part of our continuing mission to provide you with exceptional heart care, we have created designated Provider Care Teams.  These Care Teams include your primary Cardiologist (physician) and Advanced Practice Providers (APPs -  Physician Assistants and Nurse Practitioners) who all work together to provide you with the care you need, when you need it.  We recommend signing up for the patient portal called "MyChart".  Sign up information is provided on this After Visit Summary.  MyChart is used to connect with patients for Virtual Visits (Telemedicine).  Patients are able to view lab/test results, encounter notes, upcoming appointments, etc.  Non-urgent messages can be sent to your provider as well.   To learn more about what you can do with MyChart, go to [email or web address].    Your next appointment:   3  month(s)  The format for your next appointment:   In Person  Provider:   [place], [place], [place], [place]

## 2021-11-04 ENCOUNTER — Other Ambulatory Visit: Payer: Self-pay

## 2021-11-04 DIAGNOSIS — E1059 Type 1 diabetes mellitus with other circulatory complications: Secondary | ICD-10-CM

## 2021-11-04 MED ORDER — GLUCOSE BLOOD VI STRP: ORAL_STRIP | 12 refills | Status: DC

## 2021-11-04 NOTE — Telephone Encounter (Signed)
Script sent in today. 

## 2021-11-04 NOTE — Telephone Encounter (Signed)
PT calls back today regarding this RX. I let her know all we needed from her was the frequency in which she does her tests. She let me know that she tests 4 times a day.

## 2021-11-08 ENCOUNTER — Encounter: Payer: Self-pay | Admitting: Internal Medicine

## 2021-11-08 NOTE — Progress Notes (Unsigned)
Subjective:    Patient ID: [name], female    DOB: Feb 07, 1941, 81 y.o.   MRN: [id number]      HPI [name] is here for No chief complaint on file.   Chest pain - having episodes of sharp pain x few seconds, followed by burning pain x 6-7 minutes.  Just saw Dr Acie Fredrickson - does not feel this pain is cardiac.      Medications and allergies reviewed with patient and updated if appropriate.  Current Outpatient Medications on File Prior to Visit  Medication Sig Dispense Refill   acetaminophen (TYLENOL) 325 MG tablet Take 650 mg by mouth every 6 (six) hours as needed for moderate pain or headache.     albuterol (PROVENTIL HFA;VENTOLIN HFA) 108 (90 BASE) MCG/ACT inhaler Inhale 2 puffs into the lungs every 6 (six) hours as needed. For shortness of breath. 1 Inhaler 0   albuterol (PROVENTIL) (2.5 MG/3ML) 0.083% nebulizer solution USE 1 VIAL IN NEBULIZER 4 TIMES DAILY (Patient taking differently: Take 2.5 mg by nebulization every 6 (six) hours as needed for wheezing or shortness of breath.) 360 mL 1   alum & mag hydroxide-simeth (MAALOX/MYLANTA) 200-200-20 MG/5ML suspension Take 30 mLs as needed by mouth for indigestion or heartburn.     aspirin EC 81 MG tablet Take 1 tablet (81 mg total) by mouth daily.     baclofen (LIORESAL) 20 MG tablet TAKE 2 TABLETS BY MOUTH 2 TIMES DAILY. (Patient taking differently: Take 40 mg by mouth 2 (two) times daily.) 360 tablet 2   Bempedoic Acid-Ezetimibe (NEXLIZET) 180-10 MG TABS Take 1 tablet by mouth daily. 90 tablet 3   calcium carbonate (OS-CAL) 1250 (500 Ca) MG chewable tablet Chew 500 mg by mouth daily as needed for heartburn.     Cholecalciferol (VITAMIN D) 50 MCG (2000 UT) tablet Take 4,000 Units by mouth daily.     Continuous Blood Gluc Sensor (FREESTYLE LIBRE 14 DAY SENSOR) MISC UAD to monitor sugars  E10.9 9 each 3   Cyanocobalamin (VITAMIN B-12 IJ) Inject 1,000 mcg as directed every 30 (thirty) days.     diltiazem (CARDIZEM CD) 240 MG 24 hr capsule  Take 1 capsule (240 mg total) by mouth daily. 90 capsule 2   fluticasone (FLONASE) 50 MCG/ACT nasal spray Place 2 sprays into both nostrils daily.     Fluticasone-Umeclidin-Vilant (TRELEGY ELLIPTA) 200-62.5-25 MCG/ACT AEPB Inhale 1 puff into the lungs daily at 6 (six) AM. 1 each 0   furosemide (LASIX) 40 MG tablet Take 1 tablet (40 mg total) by mouth daily. 30 tablet    glucose blood test strip Use as instructed to test blood sugars up to 4 times daily 200 each 12   insulin lispro protamine-insulin lispro (HUMALOG 75/25) (75-25) 100 UNIT/ML SUSP Inject 20 Units into the skin 2 (two) times daily with a meal.     isosorbide mononitrate (IMDUR) 60 MG 24 hr tablet Take one tablet ( 60 mg ) twice daily 8 hours apart. (Patient taking differently: 60 mg 2 (two) times daily. Take one tablet ( 60 mg ) twice daily 8 hours apart.) 180 tablet 3   meclizine (ANTIVERT) 25 MG tablet Take 25 mg by mouth 2 (two) times daily as needed for dizziness.     metoprolol succinate (TOPROL-XL) 100 MG 24 hr tablet TAKE 1 TABLET BY MOUTH EVERY DAY (Patient taking differently: Take 100 mg by mouth daily.) 90 tablet 3   montelukast (SINGULAIR) 5 MG chewable tablet Chew 5 mg by  mouth at bedtime.     Multiple Vitamin (MULTIVITAMIN WITH MINERALS) TABS Take 1 tablet by mouth daily.     nitroGLYCERIN (NITROSTAT) 0.4 MG SL tablet Place 1 tablet (0.4 mg total) under the tongue every 5 (five) minutes as needed for chest pain. 25 tablet 3   pantoprazole (PROTONIX) 40 MG tablet Take 40 mg by mouth daily.     spironolactone (ALDACTONE) 25 MG tablet TAKE 1 TABLET BY MOUTH EVERY DAY 90 tablet 3   Study - ORION 4 - inclisiran 300 mg/1.33m or placebo SQ injection (PI-Stuckey) Inject 300 mg into the skin every 6 (six) months.     tapentadol (NUCYNTA) 50 MG tablet      temazepam (RESTORIL) 30 MG capsule Take 30 mg by mouth daily.     topiramate (TOPAMAX) 25 MG tablet Take 1 tablet (25 mg total) by mouth at bedtime. 30 tablet 5   Current  Facility-Administered Medications on File Prior to Visit  Medication Dose Route Frequency Provider Last Rate Last Admin   cyanocobalamin ((VITAMIN B-12)) injection 1,000 mcg  1,000 mcg Intramuscular Q30 days BBinnie Rail MD   1,000 mcg at 03/26/16 1658    Review of Systems     Objective:  There were no vitals filed for this visit. BP Readings from Last 3 Encounters:  10/30/21 110/60  10/27/21 (!) 153/73  10/06/21 106/66   Wt Readings from Last 3 Encounters:  10/30/21 176 lb (79.8 kg)  10/26/21 176 lb (79.8 kg)  10/06/21 176 lb 9.6 oz (80.1 kg)   There is no height or weight on file to calculate BMI.    Physical Exam         Assessment & Plan:    See Problem List for Assessment and Plan of chronic medical problems.

## 2021-11-09 ENCOUNTER — Ambulatory Visit (INDEPENDENT_AMBULATORY_CARE_PROVIDER_SITE_OTHER): Payer: Medicare Other | Admitting: Internal Medicine

## 2021-11-09 ENCOUNTER — Ambulatory Visit (INDEPENDENT_AMBULATORY_CARE_PROVIDER_SITE_OTHER): Payer: Medicare Other

## 2021-11-09 VITALS — BP 116/66 | HR 70 | Temp 98.3°F | Resp 16 | Ht 62.0 in | Wt 175.0 lb

## 2021-11-09 DIAGNOSIS — R0781 Pleurodynia: Secondary | ICD-10-CM | POA: Insufficient documentation

## 2021-11-09 DIAGNOSIS — I10 Essential (primary) hypertension: Secondary | ICD-10-CM

## 2021-11-09 DIAGNOSIS — R200 Anesthesia of skin: Secondary | ICD-10-CM | POA: Diagnosis not present

## 2021-11-09 DIAGNOSIS — R0602 Shortness of breath: Secondary | ICD-10-CM

## 2021-11-09 DIAGNOSIS — R0789 Other chest pain: Secondary | ICD-10-CM | POA: Insufficient documentation

## 2021-11-09 NOTE — Patient Instructions (Addendum)
     Have an xray downstairs.     Medications changes include :   none   A MRI of your brain was ordered.     Someone from that office will call you to schedule an appointment.

## 2021-11-09 NOTE — Assessment & Plan Note (Signed)
Has been going on for a while-she did see pulmonary prior to her hospitalization and states she had it then Recently saw cardiology who does not feel this is cardiac in nature ?  Related to left-sided rib pain versus other Chest x-ray in the hospital was normal She does not feel that this is her asthma-it feels different We will evaluate the rib pain because I think that is most likely the cause

## 2021-11-09 NOTE — Telephone Encounter (Signed)
Message left last week to return call to clinic.

## 2021-11-09 NOTE — Assessment & Plan Note (Addendum)
New She denies any injury Her whole left side of the chest is tender-pain is musculoskeletal and appears to be the whole rib cage-most of her pain is mid rib cage next to the sternum She has tenderness with palpation, movement, deep breaths Chest x-ray in the hospital was normal We will get rib x-rays May benefit from seeing sports medicine Continue lidocaine which seems to be helping Continue baclofen 40 mg twice daily-hard to say if this is helping or not since she takes this regularly

## 2021-11-09 NOTE — Assessment & Plan Note (Signed)
Chronic Blood pressure well controlled Continue spironolactone 25 mg daily, metoprolol XL 100 mg daily, isosorbide mononitrate 60 mg twice daily and diltiazem to 40 mg daily

## 2021-11-09 NOTE — Assessment & Plan Note (Signed)
New This started while she was in the hospital last month-seem to be sudden onset and was associated with unsteady gait At 1 point look like her left side of her mouth was also drooping ?  Stroke She states she still has the symptoms MRI of brain ordered Has an appointment with Dr. Tomi Likens next month Continue aspirin 81 mg daily, cholesterol medication Blood pressure well controlled

## 2021-11-10 NOTE — Telephone Encounter (Signed)
Dr. Acie Fredrickson, Will you addend your office visit note from 9/22  to officially comment on preoperative clearance?  Thank you.

## 2021-11-11 ENCOUNTER — Encounter: Payer: Self-pay | Admitting: Internal Medicine

## 2021-11-16 NOTE — Progress Notes (Unsigned)
[name] [name] Phone: [phone]   Assessment and Plan:     There are no diagnoses linked to this encounter.  ***   Pertinent previous records reviewed include ***   Follow Up: ***     Subjective:   I, [name], am serving as a Education administrator for Doctor Glennon Mac  Chief Complaint: left sided rib pain   HPI:   11/17/2021 Patient is a 81 year old female complaining of left sided rib pain. Patient states  Relevant Historical Information: ***  Additional pertinent review of systems negative.   Current Outpatient Medications:    acetaminophen (TYLENOL) 325 MG tablet, Take 650 mg by mouth every 6 (six) hours as needed for moderate pain or headache., Disp: , Rfl:    albuterol (PROVENTIL HFA;VENTOLIN HFA) 108 (90 BASE) MCG/ACT inhaler, Inhale 2 puffs into the lungs every 6 (six) hours as needed. For shortness of breath., Disp: 1 Inhaler, Rfl: 0   albuterol (PROVENTIL) (2.5 MG/3ML) 0.083% nebulizer solution, USE 1 VIAL IN NEBULIZER 4 TIMES DAILY (Patient taking differently: Take 2.5 mg by nebulization every 6 (six) hours as needed for wheezing or shortness of breath.), Disp: 360 mL, Rfl: 1   alum & mag hydroxide-simeth (MAALOX/MYLANTA) 200-200-20 MG/5ML suspension, Take 30 mLs as needed by mouth for indigestion or heartburn., Disp: , Rfl:    aspirin EC 81 MG tablet, Take 1 tablet (81 mg total) by mouth daily., Disp: , Rfl:    baclofen (LIORESAL) 20 MG tablet, TAKE 2 TABLETS BY MOUTH 2 TIMES DAILY. (Patient taking differently: Take 40 mg by mouth 2 (two) times daily.), Disp: 360 tablet, Rfl: 2   Bempedoic Acid-Ezetimibe (NEXLIZET) 180-10 MG TABS, Take 1 tablet by mouth daily., Disp: 90 tablet, Rfl: 3   calcium carbonate (OS-CAL) 1250 (500 Ca) MG chewable tablet, Chew 500 mg by mouth daily as needed for heartburn., Disp: , Rfl:    Cholecalciferol (VITAMIN D) 50 MCG (2000 UT) tablet, Take 4,000  Units by mouth daily., Disp: , Rfl:    Continuous Blood Gluc Sensor (FREESTYLE LIBRE 14 DAY SENSOR) MISC, UAD to monitor sugars  E10.9, Disp: 9 each, Rfl: 3   Cyanocobalamin (VITAMIN B-12 IJ), Inject 1,000 mcg as directed every 30 (thirty) days., Disp: , Rfl:    diltiazem (CARDIZEM CD) 240 MG 24 hr capsule, Take 1 capsule (240 mg total) by mouth daily., Disp: 90 capsule, Rfl: 2   fluticasone (FLONASE) 50 MCG/ACT nasal spray, Place 2 sprays into both nostrils daily., Disp: , Rfl:    Fluticasone-Umeclidin-Vilant (TRELEGY ELLIPTA) 200-62.5-25 MCG/ACT AEPB, Inhale 1 puff into the lungs daily at 6 (six) AM., Disp: 1 each, Rfl: 0   furosemide (LASIX) 40 MG tablet, Take 1 tablet (40 mg total) by mouth daily., Disp: 30 tablet, Rfl:    glucose blood test strip, Use as instructed to test blood sugars up to 4 times daily, Disp: 200 each, Rfl: 12   insulin lispro protamine-insulin lispro (HUMALOG 75/25) (75-25) 100 UNIT/ML SUSP, Inject 20 Units into the skin 2 (two) times daily with a meal., Disp: , Rfl:    isosorbide mononitrate (IMDUR) 60 MG 24 hr tablet, Take one tablet ( 60 mg ) twice daily 8 hours apart. (Patient taking differently: 60 mg 2 (two) times daily. Take one tablet ( 60 mg ) twice daily 8 hours apart.), Disp: 180 tablet, Rfl: 3   meclizine (ANTIVERT) 25 MG tablet, Take 25 mg by mouth  2 (two) times daily as needed for dizziness., Disp: , Rfl:    metoprolol succinate (TOPROL-XL) 100 MG 24 hr tablet, TAKE 1 TABLET BY MOUTH EVERY DAY (Patient taking differently: Take 100 mg by mouth daily.), Disp: 90 tablet, Rfl: 3   montelukast (SINGULAIR) 5 MG chewable tablet, Chew 5 mg by mouth at bedtime., Disp: , Rfl:    Multiple Vitamin (MULTIVITAMIN WITH MINERALS) TABS, Take 1 tablet by mouth daily., Disp: , Rfl:    nitroGLYCERIN (NITROSTAT) 0.4 MG SL tablet, Place 1 tablet (0.4 mg total) under the tongue every 5 (five) minutes as needed for chest pain., Disp: 25 tablet, Rfl: 3   pantoprazole (PROTONIX) 40 MG  tablet, Take 40 mg by mouth daily., Disp: , Rfl:    spironolactone (ALDACTONE) 25 MG tablet, TAKE 1 TABLET BY MOUTH EVERY DAY, Disp: 90 tablet, Rfl: 3   Study - ORION 4 - inclisiran 300 mg/1.51m or placebo SQ injection (PI-Stuckey), Inject 300 mg into the skin every 6 (six) months., Disp: , Rfl:    tapentadol (NUCYNTA) 50 MG tablet, , Disp: , Rfl:    temazepam (RESTORIL) 30 MG capsule, Take 30 mg by mouth daily., Disp: , Rfl:    topiramate (TOPAMAX) 25 MG tablet, Take 1 tablet (25 mg total) by mouth at bedtime., Disp: 30 tablet, Rfl: 5  Current Facility-Administered Medications:    cyanocobalamin ((VITAMIN B-12)) injection 1,000 mcg, 1,000 mcg, Intramuscular, Q30 days, BBinnie Rail MD, 1,000 mcg at 03/26/16 1658   Objective:     There were no vitals filed for this visit.    There is no height or weight on file to calculate BMI.    Physical Exam:    ***   Electronically signed by:  BBenito MccreedyD.OMarguerita MerlesSports Medicine 12:19 PM 11/16/21

## 2021-11-17 ENCOUNTER — Ambulatory Visit (HOSPITAL_COMMUNITY): Payer: Medicare Other | Attending: Cardiology

## 2021-11-17 ENCOUNTER — Ambulatory Visit (INDEPENDENT_AMBULATORY_CARE_PROVIDER_SITE_OTHER): Payer: Medicare Other | Admitting: Sports Medicine

## 2021-11-17 VITALS — BP 110/80 | Ht 62.0 in | Wt 175.0 lb

## 2021-11-17 DIAGNOSIS — I2510 Atherosclerotic heart disease of native coronary artery without angina pectoris: Secondary | ICD-10-CM | POA: Diagnosis present

## 2021-11-17 DIAGNOSIS — R0781 Pleurodynia: Secondary | ICD-10-CM | POA: Diagnosis not present

## 2021-11-17 DIAGNOSIS — R0602 Shortness of breath: Secondary | ICD-10-CM | POA: Insufficient documentation

## 2021-11-17 DIAGNOSIS — R079 Chest pain, unspecified: Secondary | ICD-10-CM | POA: Diagnosis not present

## 2021-11-17 DIAGNOSIS — J4489 Other specified chronic obstructive pulmonary disease: Secondary | ICD-10-CM | POA: Diagnosis not present

## 2021-11-17 DIAGNOSIS — R059 Cough, unspecified: Secondary | ICD-10-CM

## 2021-11-17 LAB — ECHOCARDIOGRAM COMPLETE
Area-P 1/2: 3.06 cm2
Height: 62 in
MV VTI: 1.5 cm2
S' Lateral: 2.7 cm
Weight: 2800 oz

## 2021-11-17 NOTE — Patient Instructions (Addendum)
Good to see you Tylenol [phone] mg 2-3 times a day for pain relief  May use lidocaine patches twice a day  Recommend getting Voltaren gel over the counter can apply daily over areas of pain Recommend following up with pulmonology As needed follow up

## 2021-11-18 ENCOUNTER — Telehealth: Payer: Self-pay | Admitting: Internal Medicine

## 2021-11-18 NOTE — Telephone Encounter (Signed)
Received and placed in Dr. Quay Burow folder

## 2021-11-18 NOTE — Telephone Encounter (Signed)
For our records:  We have received pre-op PW from [facility] and it has been placed in both of Dr. Eilleen Kempf boxes.

## 2021-11-23 ENCOUNTER — Telehealth: Payer: Self-pay

## 2021-11-23 NOTE — Telephone Encounter (Signed)
Left voice mail for pt to callback to schedule Medicare wellness visit with nurse Health advisor or nurse AWV

## 2021-11-24 NOTE — Telephone Encounter (Signed)
Faxed and conformation received today

## 2021-11-24 NOTE — Telephone Encounter (Signed)
Faxed in today for patient

## 2021-11-25 ENCOUNTER — Ambulatory Visit: Payer: Medicare Other | Admitting: Adult Health

## 2021-11-25 DIAGNOSIS — R0683 Snoring: Secondary | ICD-10-CM | POA: Diagnosis not present

## 2021-11-25 DIAGNOSIS — G4733 Obstructive sleep apnea (adult) (pediatric): Secondary | ICD-10-CM

## 2021-11-28 ENCOUNTER — Ambulatory Visit
Admission: RE | Admit: 2021-11-28 | Discharge: 2021-11-28 | Disposition: A | Discharge status: Home / Self Care | Payer: Medicare Other | Source: Ambulatory Visit | Attending: Internal Medicine | Admitting: Internal Medicine

## 2021-11-28 DIAGNOSIS — R200 Anesthesia of skin: Secondary | ICD-10-CM

## 2021-11-28 MED ORDER — GADOPICLENOL 0.5 MMOL/ML IV SOLN
7.5000 mL | Freq: Once | INTRAVENOUS | Status: AC | PRN
  Administered 2021-11-28: 7.5 mL via INTRAVENOUS

## 2021-11-30 DIAGNOSIS — R0683 Snoring: Secondary | ICD-10-CM | POA: Diagnosis not present

## 2021-12-08 ENCOUNTER — Telehealth: Payer: Self-pay | Admitting: Internal Medicine

## 2021-12-08 NOTE — Telephone Encounter (Signed)
Patient states that she is congested.  She did not want to schedule an appointment or go to the urgent care - She wants Dr. Quay Burow CMA to give her a call.

## 2021-12-09 ENCOUNTER — Encounter (HOSPITAL_BASED_OUTPATIENT_CLINIC_OR_DEPARTMENT_OTHER): Payer: Self-pay

## 2021-12-09 ENCOUNTER — Emergency Department (HOSPITAL_BASED_OUTPATIENT_CLINIC_OR_DEPARTMENT_OTHER): Payer: Medicare Other | Admitting: Radiology

## 2021-12-09 ENCOUNTER — Emergency Department (HOSPITAL_BASED_OUTPATIENT_CLINIC_OR_DEPARTMENT_OTHER)
Admission: EM | Admit: 2021-12-09 | Discharge: 2021-12-09 | Disposition: A | Discharge status: Home / Self Care | Payer: Medicare Other | Attending: Emergency Medicine | Admitting: Emergency Medicine

## 2021-12-09 ENCOUNTER — Other Ambulatory Visit: Payer: Self-pay

## 2021-12-09 DIAGNOSIS — J40 Bronchitis, not specified as acute or chronic: Secondary | ICD-10-CM | POA: Insufficient documentation

## 2021-12-09 DIAGNOSIS — R059 Cough, unspecified: Secondary | ICD-10-CM | POA: Diagnosis present

## 2021-12-09 DIAGNOSIS — Z794 Long term (current) use of insulin: Secondary | ICD-10-CM | POA: Diagnosis not present

## 2021-12-09 DIAGNOSIS — Z7982 Long term (current) use of aspirin: Secondary | ICD-10-CM | POA: Diagnosis not present

## 2021-12-09 DIAGNOSIS — I110 Hypertensive heart disease with heart failure: Secondary | ICD-10-CM | POA: Insufficient documentation

## 2021-12-09 DIAGNOSIS — I509 Heart failure, unspecified: Secondary | ICD-10-CM | POA: Diagnosis not present

## 2021-12-09 DIAGNOSIS — Z7951 Long term (current) use of inhaled steroids: Secondary | ICD-10-CM | POA: Diagnosis not present

## 2021-12-09 DIAGNOSIS — E119 Type 2 diabetes mellitus without complications: Secondary | ICD-10-CM | POA: Insufficient documentation

## 2021-12-09 DIAGNOSIS — Z20822 Contact with and (suspected) exposure to covid-19: Secondary | ICD-10-CM | POA: Diagnosis not present

## 2021-12-09 DIAGNOSIS — Z79899 Other long term (current) drug therapy: Secondary | ICD-10-CM | POA: Insufficient documentation

## 2021-12-09 DIAGNOSIS — I2510 Atherosclerotic heart disease of native coronary artery without angina pectoris: Secondary | ICD-10-CM | POA: Diagnosis not present

## 2021-12-09 DIAGNOSIS — J45909 Unspecified asthma, uncomplicated: Secondary | ICD-10-CM | POA: Diagnosis not present

## 2021-12-09 LAB — BASIC METABOLIC PANEL
Anion gap: 11 (ref 5–15)
BUN: 14 mg/dL (ref 8–23)
CO2: 26 mmol/L (ref 22–32)
Calcium: 9.9 mg/dL (ref 8.9–10.3)
Chloride: 104 mmol/L (ref 98–111)
Creatinine, Ser: 0.8 mg/dL (ref 0.44–1.00)
GFR, Estimated: >60 mL/min (ref >60)
Glucose, Bld: 200 mg/dL — ABNORMAL HIGH (ref 70–99)
Potassium: 4.1 mmol/L (ref 3.5–5.1)
Sodium: 141 mmol/L (ref 135–145)

## 2021-12-09 LAB — CBC
HCT: 40 % (ref 36.0–46.0)
Hemoglobin: 12.5 g/dL (ref 12.0–15.0)
MCH: 27 pg (ref 26.0–34.0)
MCHC: 31.3 g/dL (ref 30.0–36.0)
MCV: 86.4 fL (ref 80.0–100.0)
Platelets: 318 10*3/uL (ref 150–400)
RBC: 4.63 MIL/uL (ref 3.87–5.11)
RDW: 14.1 % (ref 11.5–15.5)
WBC: 9.5 10*3/uL (ref 4.0–10.5)
nRBC: 0 % (ref 0.0–0.2)

## 2021-12-09 LAB — RESP PANEL BY RT-PCR (FLU A&B, COVID) ARPGX2
Influenza A by PCR: NEGATIVE
Influenza B by PCR: NEGATIVE
SARS Coronavirus 2 by RT PCR: NEGATIVE

## 2021-12-09 LAB — TROPONIN I (HIGH SENSITIVITY)
Troponin I (High Sensitivity): 5 ng/L (ref <18)
Troponin I (High Sensitivity): 4 ng/L (ref <18)

## 2021-12-09 MED ORDER — GUAIFENESIN 100 MG/5ML PO LIQD: 100.0000 mg | ORAL | 0 refills | Status: DC | PRN

## 2021-12-09 MED ORDER — IPRATROPIUM BROMIDE 0.02 % IN SOLN
0.5000 mg | Freq: Once | RESPIRATORY_TRACT | Status: AC
  Administered 2021-12-09: 0.5 mg via RESPIRATORY_TRACT
  Filled 2021-12-09: qty 2.5

## 2021-12-09 MED ORDER — ALBUTEROL SULFATE (2.5 MG/3ML) 0.083% IN NEBU
5.0000 mg | INHALATION_SOLUTION | Freq: Once | RESPIRATORY_TRACT | Status: AC
  Administered 2021-12-09: 5 mg via RESPIRATORY_TRACT
  Filled 2021-12-09: qty 6

## 2021-12-09 MED ORDER — PREDNISONE 20 MG PO TABS: 40.0000 mg | ORAL_TABLET | Freq: Every day | ORAL | 0 refills | Status: DC

## 2021-12-09 MED ORDER — MAGNESIUM SULFATE 2 GM/50ML IV SOLN
2.0000 g | Freq: Once | INTRAVENOUS | Status: AC
  Administered 2021-12-09: 2 g via INTRAVENOUS
  Filled 2021-12-09: qty 50

## 2021-12-09 MED ORDER — AMOXICILLIN-POT CLAVULANATE 875-125 MG PO TABS: 1.0000 | ORAL_TABLET | Freq: Two times a day (BID) | ORAL | 0 refills | Status: DC

## 2021-12-09 MED ORDER — ALBUTEROL SULFATE (2.5 MG/3ML) 0.083% IN NEBU: 2.5000 mg | INHALATION_SOLUTION | Freq: Four times a day (QID) | RESPIRATORY_TRACT | 1 refills | Status: DC | PRN

## 2021-12-09 MED ORDER — METHYLPREDNISOLONE SODIUM SUCC 125 MG IJ SOLR
125.0000 mg | Freq: Once | INTRAMUSCULAR | Status: AC
  Administered 2021-12-09: 125 mg via INTRAVENOUS
  Filled 2021-12-09: qty 2

## 2021-12-09 MED ORDER — AMOXICILLIN-POT CLAVULANATE 875-125 MG PO TABS
1.0000 | ORAL_TABLET | Freq: Once | ORAL | Status: AC
  Administered 2021-12-09: 1 via ORAL
  Filled 2021-12-09: qty 1

## 2021-12-09 NOTE — ED Provider Notes (Signed)
[facility] DEPT Provider Note   CSN: [id number] Arrival date & time: 12/09/21  1238     History  Chief Complaint  Patient presents with   Cough    [name] is a 81 y.o. female.  Patient is an 81 year old female with a history of CAD, diabetes, hypertension, hyperlipidemia, CHF and asthma who is presenting today with a 6-day history of URI symptoms with worsening cough and shortness of breath.  Patient reports it started on Saturday with nasal congestion, cough and low-grade fevers.  She has had a thick sputum with her cough and reports her inhalers are not helping.  In the last 2 days she has noticed significant shortness of breath with any activity.  She has not had any swelling in her lower extremities, nausea vomiting or urinary complaints.  She has no known sick contacts and reports she was put on getting her flu shot and COVID shot next week.  The history is provided by the patient.  Cough      Home Medications Prior to Admission medications   Medication Sig Start Date End Date Taking? Authorizing Provider  albuterol (PROVENTIL) (2.5 MG/3ML) 0.083% nebulizer solution Take 3 mLs (2.5 mg total) by nebulization every 6 (six) hours as needed for wheezing or shortness of breath. 12/09/21  Yes Blanchie Dessert, MD  amoxicillin-clavulanate (AUGMENTIN) 875-125 MG tablet Take 1 tablet by mouth every 12 (twelve) hours. 12/09/21  Yes Arrie Borrelli, Loree Fee, MD  predniSONE (DELTASONE) 20 MG tablet Take 2 tablets (40 mg total) by mouth daily. 12/09/21  Yes Blanchie Dessert, MD  acetaminophen (TYLENOL) 325 MG tablet Take 650 mg by mouth every 6 (six) hours as needed for moderate pain or headache.    [provider]  albuterol (PROVENTIL HFA;VENTOLIN HFA) 108 (90 BASE) MCG/ACT inhaler Inhale 2 puffs into the lungs every 6 (six) hours as needed. For shortness of breath. 03/06/12   Elsie Stain, MD  albuterol (PROVENTIL) (2.5 MG/3ML) 0.083% nebulizer solution  USE 1 VIAL IN NEBULIZER 4 TIMES DAILY Patient taking differently: Take 2.5 mg by nebulization every 6 (six) hours as needed for wheezing or shortness of breath. 05/07/20   Marshell Garfinkel, MD  alum & mag hydroxide-simeth (MAALOX/MYLANTA) 200-200-20 MG/5ML suspension Take 30 mLs as needed by mouth for indigestion or heartburn.    [provider]  aspirin EC 81 MG tablet Take 1 tablet (81 mg total) by mouth daily. 12/10/16   Richardson Dopp T, PA-C  baclofen (LIORESAL) 20 MG tablet TAKE 2 TABLETS BY MOUTH 2 TIMES DAILY. Patient taking differently: Take 40 mg by mouth 2 (two) times daily. 03/13/21   Binnie Rail, MD  Bempedoic Acid-Ezetimibe (NEXLIZET) 180-10 MG TABS Take 1 tablet by mouth daily. 08/26/21   Richardson Dopp T, PA-C  calcium carbonate (OS-CAL) 1250 (500 Ca) MG chewable tablet Chew 500 mg by mouth daily as needed for heartburn.    [provider]  Cholecalciferol (VITAMIN D) 50 MCG (2000 UT) tablet Take 4,000 Units by mouth daily.    [provider]  Continuous Blood Gluc Sensor (FREESTYLE LIBRE 14 DAY SENSOR) MISC UAD to monitor sugars  E10.9 08/07/21   Burns, Claudina Lick, MD  Cyanocobalamin (VITAMIN B-12 IJ) Inject 1,000 mcg as directed every 30 (thirty) days.    [provider]  diltiazem (CARDIZEM CD) 240 MG 24 hr capsule Take 1 capsule (240 mg total) by mouth daily. 07/17/21   Binnie Rail, MD  fluticasone (FLONASE) 50 MCG/ACT nasal spray Place  2 sprays into both nostrils daily.    [provider]  Fluticasone-Umeclidin-Vilant (TRELEGY ELLIPTA) 200-62.5-25 MCG/ACT AEPB Inhale 1 puff into the lungs daily at 6 (six) AM. 05/01/21   Parrett, Fonnie Mu, NP  furosemide (LASIX) 40 MG tablet Take 1 tablet (40 mg total) by mouth daily. 10/05/21 01/03/22  Binnie Rail, MD  glucose blood test strip Use as instructed to test blood sugars up to 4 times daily 11/04/21   Binnie Rail, MD  insulin lispro protamine-insulin lispro (HUMALOG 75/25) (75-25) 100 UNIT/ML SUSP  Inject 20 Units into the skin 2 (two) times daily with a meal.    [provider]  isosorbide mononitrate (IMDUR) 60 MG 24 hr tablet Take one tablet ( 60 mg ) twice daily 8 hours apart. Patient taking differently: 60 mg 2 (two) times daily. Take one tablet ( 60 mg ) twice daily 8 hours apart. 05/14/20   Richardson Dopp T, PA-C  meclizine (ANTIVERT) 25 MG tablet Take 25 mg by mouth 2 (two) times daily as needed for dizziness.    [provider]  metoprolol succinate (TOPROL-XL) 100 MG 24 hr tablet TAKE 1 TABLET BY MOUTH EVERY DAY Patient taking differently: Take 100 mg by mouth daily. 02/23/21   Nahser, Wonda Cheng, MD  montelukast (SINGULAIR) 5 MG chewable tablet Chew 5 mg by mouth at bedtime.    [provider]  Multiple Vitamin (MULTIVITAMIN WITH MINERALS) TABS Take 1 tablet by mouth daily.    [provider]  nitroGLYCERIN (NITROSTAT) 0.4 MG SL tablet Place 1 tablet (0.4 mg total) under the tongue every 5 (five) minutes as needed for chest pain. 07/22/21   Belva Crome, MD  nortriptyline (PAMELOR) 50 MG capsule Take by mouth. 11/15/21   [provider]  pantoprazole (PROTONIX) 40 MG tablet Take 40 mg by mouth daily. 01/22/15   [provider]  spironolactone (ALDACTONE) 25 MG tablet TAKE 1 TABLET BY MOUTH EVERY DAY 09/15/21   Nahser, Wonda Cheng, MD  Study - ORION 4 - inclisiran 300 mg/1.78m or placebo SQ injection (PI-Stuckey) Inject 300 mg into the skin every 6 (six) months. 01/17/18   [provider]  tapentadol (NUCYNTA) 50 MG tablet     [provider]  temazepam (RESTORIL) 30 MG capsule Take 30 mg by mouth daily. 09/16/21   [provider]  topiramate (TOPAMAX) 25 MG tablet Take 1 tablet (25 mg total) by mouth at bedtime. 08/19/21   JPieter Partridge DO      Allergies    Amlodipine, Banana, Co q10 [coenzyme q10], Codeine, Insulin aspart, Lisinopril, Metformin and related, Morphine, Pentazocine, Pravastatin, Repatha  [evolocumab], Statins, Sulfa antibiotics, Sulfonamide derivatives, and Tramadol hcl    Review of Systems   Review of Systems  Respiratory:  Positive for cough.     Physical Exam Updated Vital Signs BP 124/72   Pulse 78   Temp 99.7 F (37.6 C)   Resp (!) 21   Ht '5\' 2"'$  (1.575 m)   Wt 79.4 kg   LMP  (LMP Unknown)   SpO2 98%   BMI 32.02 kg/m  Physical Exam Vitals and nursing note reviewed.  Constitutional:      General: She is not in acute distress.    Appearance: She is well-developed.  HENT:     Head: Normocephalic and atraumatic.  Eyes:     Pupils: Pupils are equal, round, and reactive to light.  Cardiovascular:     Rate and Rhythm: Normal rate  and regular rhythm.     Heart sounds: Normal heart sounds. No murmur heard.    No friction rub.  Pulmonary:     Effort: Pulmonary effort is normal.     Breath sounds: Wheezing present. No rales.  Abdominal:     General: Bowel sounds are normal. There is no distension.     Palpations: Abdomen is soft.     Tenderness: There is no abdominal tenderness. There is no guarding or rebound.  Musculoskeletal:        General: No tenderness. Normal range of motion.     Right lower leg: No edema.     Left lower leg: No edema.     Comments: No edema  Skin:    General: Skin is warm and dry.     Findings: No rash.  Neurological:     Mental Status: She is alert and oriented to person, place, and time.     Cranial Nerves: No cranial nerve deficit.  Psychiatric:        Behavior: Behavior normal.     ED Results / Procedures / Treatments   Labs (all labs ordered are listed, but only abnormal results are displayed) Labs Reviewed  BASIC METABOLIC PANEL - Abnormal; Notable for the following components:      Result Value   Glucose, Bld 200 (*)    All other components within normal limits  RESP PANEL BY RT-PCR (FLU A&B, COVID) ARPGX2  CBC  TROPONIN I (HIGH SENSITIVITY)  TROPONIN I (HIGH SENSITIVITY)    EKG EKG  Interpretation  Date/Time:  Wednesday December 09 2021 12:48:28 EDT Ventricular Rate:  88 PR Interval:  126 QRS Duration: 132 QT Interval:  410 QTC Calculation: 496 R Axis:   56 Text Interpretation: Normal sinus rhythm Right bundle branch block When compared with ECG of 26-Oct-2021 16:58, No significant change was found Confirmed by Blanchie Dessert [phone]) on 12/09/2021 1:55:32 PM  Radiology DG Chest 2 View  Result Date: 12/09/2021 CLINICAL DATA:  Provided history: Chest pain. Additional history provided: Productive cough. History of asthma. History of heart surgery 6 years ago. EXAM: CHEST - 2 VIEW COMPARISON:  Prior chest radiographs 11/09/2021 and earlier. FINDINGS: Prior median sternotomy/CABG. Heart size within normal limits. Mitral annular calcifications. Aortic atherosclerosis. No appreciable airspace consolidation. No evidence of pleural effusion or pneumothorax. No acute bony abnormality identified. Degenerative changes of the spine. Prior reverse right shoulder arthroplasty. IMPRESSION: No evidence of acute cardiopulmonary abnormality. Mitral annular calcifications. Aortic Atherosclerosis (ICD10-I70.0). Electronically Signed   By: Kellie Simmering D.O.   On: 12/09/2021 13:15    Procedures Procedures    Medications Ordered in ED Medications  albuterol (PROVENTIL) (2.5 MG/3ML) 0.083% nebulizer solution 5 mg (has no administration in time range)  ipratropium (ATROVENT) nebulizer solution 0.5 mg (has no administration in time range)  albuterol (PROVENTIL) (2.5 MG/3ML) 0.083% nebulizer solution 5 mg (5 mg Nebulization Given 12/09/21 1358)  ipratropium (ATROVENT) nebulizer solution 0.5 mg (0.5 mg Nebulization Given 12/09/21 1358)  methylPREDNISolone sodium succinate (SOLU-MEDROL) 125 mg/2 mL injection 125 mg (125 mg Intravenous Given 12/09/21 1346)  magnesium sulfate IVPB 2 g 50 mL (2 g Intravenous New Bag/Given 12/09/21 1346)  amoxicillin-clavulanate (AUGMENTIN) 875-125 MG per tablet 1  tablet (1 tablet Oral Given 12/09/21 1452)    ED Course/ Medical Decision Making/ A&P                           Medical Decision Making Amount and/or  Complexity of Data Reviewed Labs: ordered. Decision-making details documented in ED Course. Radiology: ordered and independent interpretation performed. Decision-making details documented in ED Course. ECG/medicine tests: ordered and independent interpretation performed. Decision-making details documented in ED Course.  Risk Prescription drug management.   Pt with multiple medical problems and comorbidities and presenting today with a complaint that caries a high risk for morbidity and mortality.  Here today with complaint of cough and shortness of breath.  Patient has had URI symptoms now for the last 6 days and on exam today she is wheezing and reporting low-grade fever and productive cough.  Concern for bronchitis versus pneumonia.  Patient does have a history of CHF and CAD but no symptoms to suggest ACS today and no findings concerning for CHF.  Patient will be given albuterol, Atrovent, Solu-Medrol and magnesium.  Sats are currently 94% on room air.  I independently interpreted patient's labs and EKG.  CBC, BMP, troponin are all within normal limits.  Except for blood sugar of 200 and patient is known diabetic and takes medication.  EKG with a right bundle branch block but no acute changes from prior.  I have independently visualized and interpreted pt's images today.  Chest x-ray today without evidence of pneumonia.  No signs of fluid overload.  Will recheck after breathing treatments.  3:00 PM Wheezing has improved but still some wheezing present on the right side and will give a second nebulizer treatment.  Discussed the findings with the patient.  She will need a refill of her albuterol nebulizing solution.  Also will start Augmentin given her history of asthma and colored sputum as well as 5 days of prednisone.  Discussed with patient that  this will elevate her blood sugar and she will need to adjust her insulin which she feels comfortable in doing.  Sats have been 98% on room air and she has not required any oxygen.  Feel that there is no indication for admission at this time but she was given return precautions.          Final Clinical Impression(s) / ED Diagnoses Final diagnoses:  Bronchitis    Rx / DC Orders ED Discharge Orders          Ordered    predniSONE (DELTASONE) 20 MG tablet  Daily        12/09/21 1500    amoxicillin-clavulanate (AUGMENTIN) 875-125 MG tablet  Every 12 hours        12/09/21 1500    albuterol (PROVENTIL) (2.5 MG/3ML) 0.083% nebulizer solution  Every 6 hours PRN        12/09/21 1500              Blanchie Dessert, MD 12/09/21 1500

## 2021-12-09 NOTE — ED Triage Notes (Signed)
Patient here POV from Home.  Endorses Sore Throat, Productive Cough, Fatigue, SOB, Aches for approximately 4-5 Days.   No Confirmed Fevers.   NAD noted during Triage. A&Ox4. GCS 15. Ambulatory.

## 2021-12-09 NOTE — Telephone Encounter (Signed)
Message left for patient to return call to clinic regarding her symptoms.

## 2021-12-09 NOTE — Discharge Instructions (Addendum)
You can start taking the steroid and the antibiotic tomorrow morning.  Use your nebulizer as needed.  Your COVID test, blood work and x-ray today look normal but you do have bronchitis.  It is possible that even with using the medications and the inhalers her symptoms could get worse.  If you feel like your shortness of breath is getting much worse despite the medications or you start vomiting or running a high fever return to the ER.

## 2021-12-09 NOTE — ED Provider Notes (Signed)
  Provider Note MRN:  [id number]  Arrival date & time: 12/09/21    ED Course and Medical Decision Making  Assumed care from [facility] at shift change.  See note from prior team for complete details, in brief:   81 yo female Hx asthma, chf, cad, htn Here with cough / uri s/s for the last few days Workup stable here in the ED Given breathing treatment/mgso4  Plan per prior physician follow up after nebs/re-assess/likely dc  Pt feeling better after rpt duoneb, cough improving, she has minimal wheezing b/l, she is not hypoxic, no increased WOB, she is feeling better overall.  Will plan to discharge per plan of prior team, she is requesting anti-tussive and albuterol neb refill which has been provided. Encouraged close f/u with her pcp   The patient improved significantly and was discharged in stable condition. Detailed discussions were had with the patient regarding current findings, and need for close f/u with PCP or on call doctor. The patient has been instructed to return immediately if the symptoms worsen in any way for re-evaluation. Patient verbalized understanding and is in agreement with current care plan. All questions answered prior to discharge.    Procedures  Final Clinical Impressions(s) / ED Diagnoses     ICD-10-CM   1. Bronchitis  J40       ED Discharge Orders          Ordered    predniSONE (DELTASONE) 20 MG tablet  Daily        12/09/21 1500    amoxicillin-clavulanate (AUGMENTIN) 875-125 MG tablet  Every 12 hours        12/09/21 1500    albuterol (PROVENTIL) (2.5 MG/3ML) 0.083% nebulizer solution  Every 6 hours PRN        12/09/21 1500    guaiFENesin (ROBITUSSIN) 100 MG/5ML liquid  Every 4 hours PRN        12/09/21 1625              Discharge Instructions      You can start taking the steroid and the antibiotic tomorrow morning.  Use your nebulizer as needed.  Your COVID test, blood work and x-ray today look normal but you do have bronchitis.  It is  possible that even with using the medications and the inhalers her symptoms could get worse.  If you feel like your shortness of breath is getting much worse despite the medications or you start vomiting or running a high fever return to the ER.       Jeanell Sparrow, DO 12/09/21 1625

## 2021-12-18 ENCOUNTER — Encounter: Payer: Self-pay | Admitting: Internal Medicine

## 2021-12-18 ENCOUNTER — Ambulatory Visit (INDEPENDENT_AMBULATORY_CARE_PROVIDER_SITE_OTHER): Payer: Medicare Other | Admitting: Internal Medicine

## 2021-12-18 VITALS — BP 110/74 | Ht 62.0 in | Wt 172.0 lb

## 2021-12-18 DIAGNOSIS — E1059 Type 1 diabetes mellitus with other circulatory complications: Secondary | ICD-10-CM

## 2021-12-18 LAB — POCT GLYCOSYLATED HEMOGLOBIN (HGB A1C): Hemoglobin A1C: 6.9 % — AB (ref 4.0–5.6)

## 2021-12-18 LAB — POCT GLUCOSE (DEVICE FOR HOME USE): POC Glucose: 324 mg/dl — AB (ref 70–99)

## 2021-12-18 MED ORDER — FREESTYLE LIBRE 2 SENSOR MISC: 1.0000 | 3 refills | Status: DC

## 2021-12-18 MED ORDER — "INSULIN SYRINGE 29G X 1"" 0.3 ML MISC": 1.0000 | Freq: Two times a day (BID) | 3 refills | Status: DC

## 2021-12-18 MED ORDER — INSULIN LISPRO PROT & LISPRO (75-25 MIX) 100 UNIT/ML ~~LOC~~ SUSP: 24.0000 [IU] | Freq: Two times a day (BID) | SUBCUTANEOUS | 3 refills | Status: DC

## 2021-12-18 MED ORDER — FREESTYLE LIBRE 2 READER DEVI: 1.0000 | 3 refills | Status: DC

## 2021-12-18 NOTE — Progress Notes (Signed)
Name: [name]  MRN/ DOB: [id number], [birth year]   Age/ Sex: 81 y.o., female    PCP: [name] Rail, MD   Reason for Endocrinology Evaluation: Type 1 Diabetes Mellitus     Date of Initial Endocrinology Visit: 12/18/2021     PATIENT IDENTIFIER: [name] is a 81 y.o. female with a past medical history of DM, CAD and OSA. The patient presented for initial endocrinology clinic visit on 12/18/2021 for consultative assistance with her diabetes management.    HPI: [name] was    Diagnosed with DM 1986 Prior Medications tried/Intolerance: Metformin- Vomiting  Currently checking blood sugars 1 x / day Hypoglycemia episodes : yes               Symptoms: yes                 Frequency: in middle of the day  Hemoglobin A1c has ranged from 6.9% in 2009, peaking at 9.5% in 2023.  In terms of diet, the patient 3 meals a day   She was on [employer] but needed a new prescription   Has  tingling of the thumb Denies tingling of the feet No recent GI issues   Used to follow-up with [name]    HOME DIABETES REGIMEN: Humalog mix 20 units With breakfast, 10-15 units with Lunch and 20 units with Supper       METER DOWNLOAD SUMMARY: unable to download [phone]  DIABETIC COMPLICATIONS: Microvascular complications:   Denies: CKD, retinopathy Last eye exam: Completed 11/2021  Macrovascular complications:  CAD Denies:  PVD, CVA   PAST HISTORY: Past Medical History:  Past Medical History:  Diagnosis Date   Adenomatous colon polyp    Allergy    Anxiety    Asthma    Carotid artery disease ([place])    carotid US 02/2017: bilat ICA 1-39%   Chronic diastolic CHF    Echo 08/621: EF 65-70, Gr 2 DD, mild MS (mean 5), PASP 44   COPD    pt is unsure if has been officially diagnosed   Coronary artery disease    CABG '09- cathed 12/09, 9/10, 6/11, 3/14 and 12/13/16- medical Rx // cath 12/2016 - 2/3 grafts patent >> med Rx // Myoview 12/17: low risk   Diabetes mellitus     Gastroesophageal reflux disease    Hiatal hernia    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   Hyperlipidemia    Hypertension    PONV (postoperative nausea and vomiting)    Schatzki's ring    Shoulder injury    resolved after shoulder surgery   Sleep apnea    not on cpap   Past Surgical History:  Past Surgical History:  Procedure Laterality Date   ABDOMINAL HYSTERECTOMY     APPENDECTOMY     came out with Hysterectomy   CARDIAC CATHETERIZATION  07/23/2009   EF 60%   CARDIAC CATHETERIZATION  10/11/2008   CARDIAC CATHETERIZATION  03/01/2007   EF 75-80%   CARDIOVASCULAR STRESS TEST  11/15/2007   EF 60%   COLONOSCOPY     CORONARY ARTERY BYPASS GRAFT     SEVERELY DISEASED SAPHENOUS VEIN GRAFT TO THE RIGHT CORONARY ARTERY BUT WITH FAIRLY WELL PRESERVED FLOW TO THE DISTAL RIGHT CORONARY ARTERY FROM THE NATIVE CIRCULATION-RESTART  CATH IN JUNE 2000, REVEALS MILD/MODERATE  CAD WITH GOOD FLOW DOWN HER LAD   ESOPHAGOGASTRODUODENOSCOPY     EYE SURGERY  bilateral cataract surgery with lens implant   LEFT HEART CATH AND CORS/GRAFTS ANGIOGRAPHY N/A 12/14/2016   Procedure: LEFT HEART CATH AND CORS/GRAFTS ANGIOGRAPHY;  Surgeon: [name] Booze, MD;  Location: [facility];  Service: Cardiovascular;  Laterality: N/A;   lense removal Left    POLYPECTOMY     RIGHT/LEFT HEART CATH AND CORONARY/GRAFT ANGIOGRAPHY N/A 07/28/2021   Procedure: RIGHT/LEFT HEART CATH AND CORONARY/GRAFT ANGIOGRAPHY;  Surgeon: [name] Sine, MD;  Location: [facility];  Service: Cardiovascular;  Laterality: N/A;   ROTATOR CUFF REPAIR     right and left   TONSILLECTOMY     age 89   TOTAL KNEE ARTHROPLASTY Right 02/20/2014   Procedure: RIGHT TOTAL KNEE ARTHROPLASTY;  Surgeon: Tobi Bastos, MD;  Location: [facility];  Service: Orthopedics;  Laterality: Right;   tumor removed kidney     UPPER GASTROINTESTINAL ENDOSCOPY     US ECHOCARDIOGRAPHY  03/08/2008   EF 55-60%    Social History:  reports that  she quit smoking about 45 years ago. Her smoking use included cigarettes. She started smoking about 66 years ago. She has a 10.50 pack-year smoking history. She has never used smokeless tobacco. She reports that she does not drink alcohol and does not use drugs. Family History:  Family History  Problem Relation Age of Onset   Heart disease Maternal Grandfather    Heart failure Maternal Grandfather    Diabetes Maternal Grandfather    Heart attack Father    Diabetes Mother    Rheum arthritis Sister    Emphysema Paternal Uncle    Esophageal cancer Brother 41       she said he was born with it   Emphysema Paternal Aunt    Healthy Child    Neuropathy Neg Hx    Multiple sclerosis Neg Hx    Colon cancer Neg Hx    Colon polyps Neg Hx    Rectal cancer Neg Hx    Stomach cancer Neg Hx      HOME MEDICATIONS: Allergies as of 12/18/2021       Reactions   Amlodipine Other (See Comments)   hallucinations    Banana Nausea And Vomiting   Stomach pumped   Co Q10 [coenzyme Q10] Other (See Comments)   Body cramps   Codeine Other (See Comments)   Hallucinate, loose identity and don't know who I am   Insulin Aspart Other (See Comments)   Unknown   Lisinopril Other (See Comments)   nose bleed   Metformin And Related Nausea And Vomiting   Morphine Other (See Comments)   Can not function, it immobilizes me    Pentazocine Nausea And Vomiting   Pravastatin Other (See Comments)   Hands locked up   Repatha [evolocumab] Other (See Comments)   myalgias   Statins Other (See Comments)   Muscle cramps   Sulfa Antibiotics Swelling   Sulfonamide Derivatives Swelling   Tramadol Hcl Other (See Comments)   Dizziness, cant function         Medication List        Accurate as of December 18, 2021  1:39 PM. If you have any questions, ask your nurse or doctor.          acetaminophen 325 MG tablet Commonly known as: TYLENOL Take 650 mg by mouth every 6 (six) hours as needed for moderate pain  or headache.   albuterol 108 (90 Base) MCG/ACT inhaler Commonly known as: VENTOLIN HFA Inhale 2 puffs  into the lungs every 6 (six) hours as needed. For shortness of breath. What changed: Another medication with the same name was changed. Make sure you understand how and when to take each.   albuterol (2.5 MG/3ML) 0.083% nebulizer solution Commonly known as: PROVENTIL USE 1 VIAL IN NEBULIZER 4 TIMES DAILY What changed: See the new instructions.   albuterol (2.5 MG/3ML) 0.083% nebulizer solution Commonly known as: PROVENTIL Take 3 mLs (2.5 mg total) by nebulization every 6 (six) hours as needed for wheezing or shortness of breath. What changed: Another medication with the same name was changed. Make sure you understand how and when to take each.   alum & mag hydroxide-simeth 200-200-20 MG/5ML suspension Commonly known as: MAALOX/MYLANTA Take 30 mLs as needed by mouth for indigestion or heartburn.   amoxicillin-clavulanate 875-125 MG tablet Commonly known as: AUGMENTIN Take 1 tablet by mouth every 12 (twelve) hours.   aspirin EC 81 MG tablet Take 1 tablet (81 mg total) by mouth daily.   baclofen 20 MG tablet Commonly known as: LIORESAL TAKE 2 TABLETS BY MOUTH 2 TIMES DAILY.   calcium carbonate 1250 (500 Ca) MG chewable tablet Commonly known as: OS-CAL Chew 500 mg by mouth daily as needed for heartburn.   diltiazem 240 MG 24 hr capsule Commonly known as: CARDIZEM CD Take 1 capsule (240 mg total) by mouth daily.   fluticasone 50 MCG/ACT nasal spray Commonly known as: FLONASE Place 2 sprays into both nostrils daily.   FreeStyle Libre 14 Day Sensor Misc UAD to monitor sugars  E10.9   furosemide 40 MG tablet Commonly known as: LASIX Take 1 tablet (40 mg total) by mouth daily.   glucose blood test strip Use as instructed to test blood sugars up to 4 times daily   guaiFENesin 100 MG/5ML liquid Commonly known as: ROBITUSSIN Take 5-10 mLs (100-200 mg total) by mouth every  4 (four) hours as needed for cough or to loosen phlegm.   insulin lispro protamine-lispro (75-25) 100 UNIT/ML Susp injection Commonly known as: HUMALOG 75/25 MIX Inject 20 Units into the skin 2 (two) times daily with a meal. 10 units at lunch depending on bs reading   isosorbide mononitrate 60 MG 24 hr tablet Commonly known as: IMDUR Take one tablet ( 60 mg ) twice daily 8 hours apart. What changed:  how much to take when to take this   meclizine 25 MG tablet Commonly known as: ANTIVERT Take 25 mg by mouth 2 (two) times daily as needed for dizziness.   metoprolol succinate 100 MG 24 hr tablet Commonly known as: TOPROL-XL TAKE 1 TABLET BY MOUTH EVERY DAY   montelukast 5 MG chewable tablet Commonly known as: SINGULAIR Chew 5 mg by mouth at bedtime.   multivitamin with minerals Tabs tablet Take 1 tablet by mouth daily.   Nexlizet 180-10 MG Tabs Generic drug: Bempedoic Acid-Ezetimibe Take 1 tablet by mouth daily.   nitroGLYCERIN 0.4 MG SL tablet Commonly known as: NITROSTAT Place 1 tablet (0.4 mg total) under the tongue every 5 (five) minutes as needed for chest pain.   nortriptyline 50 MG capsule Commonly known as: PAMELOR Take by mouth.   Nucynta 50 MG tablet Generic drug: tapentadol   ORION 4 inclisiran or placebo 300 mg/1.5 mL SQ injection Inject 300 mg into the skin every 6 (six) months.   predniSONE 20 MG tablet Commonly known as: DELTASONE Take 2 tablets (40 mg total) by mouth daily.   Protonix 40 MG tablet Generic drug: pantoprazole Take 40  mg by mouth daily.   spironolactone 25 MG tablet Commonly known as: ALDACTONE TAKE 1 TABLET BY MOUTH EVERY DAY   temazepam 30 MG capsule Commonly known as: RESTORIL Take 30 mg by mouth daily.   topiramate 25 MG tablet Commonly known as: TOPAMAX Take 1 tablet (25 mg total) by mouth at bedtime.   Trelegy Ellipta 200-62.5-25 MCG/ACT Aepb Generic drug: Fluticasone-Umeclidin-Vilant Inhale 1 puff into the lungs  daily at 6 (six) AM.   VITAMIN B-12 IJ Inject 1,000 mcg as directed every 30 (thirty) days.   Vitamin D 50 MCG (2000 UT) tablet Take 4,000 Units by mouth daily.         ALLERGIES: Allergies  Allergen Reactions   Amlodipine Other (See Comments)    hallucinations    Banana Nausea And Vomiting    Stomach pumped   Co Q10 [Coenzyme Q10] Other (See Comments)    Body cramps   Codeine Other (See Comments)    Hallucinate, loose identity and don't know who I am   Insulin Aspart Other (See Comments)    Unknown   Lisinopril Other (See Comments)    nose bleed   Metformin And Related Nausea And Vomiting   Morphine Other (See Comments)    Can not function, it immobilizes me    Pentazocine Nausea And Vomiting   Pravastatin Other (See Comments)    Hands locked up   Repatha [Evolocumab] Other (See Comments)    myalgias   Statins Other (See Comments)    Muscle cramps   Sulfa Antibiotics Swelling   Sulfonamide Derivatives Swelling   Tramadol Hcl Other (See Comments)    Dizziness, cant function      REVIEW OF SYSTEMS: A comprehensive ROS was conducted with the patient and is negative except as per HPI     OBJECTIVE:   VITAL SIGNS: BP 110/74 (BP Location: Left Arm, Patient Position: Sitting, Cuff Size: Small)   Ht '5\' 2"'$  (1.575 m)   Wt 172 lb (78 kg)   LMP  (LMP Unknown)   BMI 31.46 kg/m    PHYSICAL EXAM:  General: Pt appears well and is in NAD  Neck: General: Supple without adenopathy or carotid bruits. Thyroid: Thyroid size normal.  No goiter or nodules appreciated.   Lungs: Clear with good BS bilat with no rales, rhonchi, or wheezes  Heart: RRR   Extremities:  Lower extremities - No pretibial edema. No lesions.  Neuro: MS is good with appropriate affect, pt is alert and Ox3    DATA REVIEWED:  Lab Results  Component Value Date   HGBA1C 8.7 (H) 10/05/2021   HGBA1C 9.5 (H) 03/16/2021   HGBA1C 8.1 (H) 12/19/2019    Latest Reference Range & Units 12/09/21 13:01   Sodium 135 - 145 mmol/L 141  Potassium 3.5 - 5.1 mmol/L 4.1  Chloride 98 - 111 mmol/L 104  CO2 22 - 32 mmol/L 26  Glucose 70 - 99 mg/dL 200 (H)  BUN 8 - 23 mg/dL 14  Creatinine 0.44 - 1.00 mg/dL 0.80  Calcium 8.9 - 10.3 mg/dL 9.9  Anion gap 5 - 15  11  GFR, Estimated >60 mL/min >60   ASSESSMENT / PLAN / RECOMMENDATIONS:   1) Type 1 Diabetes Mellitus, Optimaly controlled, With macrovascular complications - Most recent A1c of 6.9 %. Goal A1c < 7.5 %.    - Pt endorses hyperglycemia and has been adjusting her insulin and currently on insulin TID  - We discussed the risk of hypoglycemia with insulin stacking  and insulin mix should be taken BID to prevent stacking , we also entertained the idea or Basal/prandial  - We discussed fatal consequences of hypoglycemia  In office Bg 324 mg/dL  -A prescription for Freestyle libre sensors and receiver will be faxed to her DME supplier -She is intolerant to metformin -She cannot tolerate oral agents as it causes vomiting  MEDICATIONS: Increase Humalog mix to 24 units before breakfast and 24 units before supper  EDUCATION / INSTRUCTIONS: BG monitoring instructions: Patient is instructed to check her blood sugars 3 times a day, before each meal. Call [facility] if: BG persistently < 70  I reviewed the Rule of 15 for the treatment of hypoglycemia in detail with the patient. Literature supplied.   2) Diabetic complications:  Eye: Does not have known diabetic retinopathy.  Neuro/ Feet: Does not have known diabetic peripheral neuropathy. Renal: Patient does not have known baseline CKD. She is not on an ACEI/ARB at present.    Follow-up in 4 months   Signed electronically by: Mack Guise, MD  [name]  [facility] [address] [place],  [address] Phone: [phone] FAX: [phone]   CC: [name] Rail, MD [place] [address] Phone:  [phone]  Fax: [phone]    Return to Endocrinology clinic as below: Future Appointments  Date Time Provider [place]  12/23/2021  1:50 PM Pieter Partridge, DO [facility] None  12/23/2021  2:45 PM Edna 1 [facility] None  12/25/2021  1:00 PM Marshell Garfinkel, MD [facility] None  01/29/2022  3:10 PM Elgie Collard, PA-C [facility]  04/07/2022  1:20 PM [name] Rail, MD [facility] None  10/07/2022  9:10 AM Quay Burow, Claudina Lick, MD [facility] None

## 2021-12-18 NOTE — Patient Instructions (Addendum)
Take Humalog Mix 24 units With Breakfast and 24 units with Supper      HOW TO TREAT LOW BLOOD SUGARS (Blood sugar LESS THAN 70 MG/DL) Please follow the RULE OF 15 for the treatment of hypoglycemia treatment (when your (blood sugars are less than 70 mg/dL)   STEP 1: Take 15 grams of carbohydrates when your blood sugar is low, which includes:  3-4 GLUCOSE TABS  OR 3-4 OZ OF JUICE OR REGULAR SODA OR ONE TUBE OF GLUCOSE GEL    STEP 2: RECHECK blood sugar in 15 MINUTES STEP 3: If your blood sugar is still low at the 15 minute recheck --> then, go back to STEP 1 and treat AGAIN with another 15 grams of carbohydrates.

## 2021-12-20 ENCOUNTER — Emergency Department (HOSPITAL_COMMUNITY): Payer: Medicare Other

## 2021-12-20 ENCOUNTER — Encounter (HOSPITAL_COMMUNITY): Payer: Self-pay

## 2021-12-20 ENCOUNTER — Observation Stay (HOSPITAL_COMMUNITY)
Admission: EM | Admit: 2021-12-20 | Discharge: 2021-12-21 | Disposition: A | Discharge status: Home Health | Payer: Medicare Other | Attending: Family Medicine | Admitting: Family Medicine

## 2021-12-20 ENCOUNTER — Other Ambulatory Visit: Payer: Self-pay

## 2021-12-20 DIAGNOSIS — R1011 Right upper quadrant pain: Secondary | ICD-10-CM | POA: Diagnosis not present

## 2021-12-20 DIAGNOSIS — Z96651 Presence of right artificial knee joint: Secondary | ICD-10-CM | POA: Insufficient documentation

## 2021-12-20 DIAGNOSIS — I110 Hypertensive heart disease with heart failure: Secondary | ICD-10-CM | POA: Insufficient documentation

## 2021-12-20 DIAGNOSIS — E109 Type 1 diabetes mellitus without complications: Secondary | ICD-10-CM | POA: Diagnosis present

## 2021-12-20 DIAGNOSIS — K7460 Unspecified cirrhosis of liver: Secondary | ICD-10-CM | POA: Diagnosis present

## 2021-12-20 DIAGNOSIS — K219 Gastro-esophageal reflux disease without esophagitis: Secondary | ICD-10-CM | POA: Diagnosis present

## 2021-12-20 DIAGNOSIS — J45909 Unspecified asthma, uncomplicated: Secondary | ICD-10-CM | POA: Diagnosis not present

## 2021-12-20 DIAGNOSIS — I5032 Chronic diastolic (congestive) heart failure: Secondary | ICD-10-CM | POA: Diagnosis present

## 2021-12-20 DIAGNOSIS — Z7951 Long term (current) use of inhaled steroids: Secondary | ICD-10-CM | POA: Insufficient documentation

## 2021-12-20 DIAGNOSIS — E10649 Type 1 diabetes mellitus with hypoglycemia without coma: Principal | ICD-10-CM | POA: Insufficient documentation

## 2021-12-20 DIAGNOSIS — Z7982 Long term (current) use of aspirin: Secondary | ICD-10-CM | POA: Insufficient documentation

## 2021-12-20 DIAGNOSIS — J449 Chronic obstructive pulmonary disease, unspecified: Secondary | ICD-10-CM | POA: Insufficient documentation

## 2021-12-20 DIAGNOSIS — I2510 Atherosclerotic heart disease of native coronary artery without angina pectoris: Secondary | ICD-10-CM | POA: Diagnosis present

## 2021-12-20 DIAGNOSIS — I25709 Atherosclerosis of coronary artery bypass graft(s), unspecified, with unspecified angina pectoris: Secondary | ICD-10-CM

## 2021-12-20 DIAGNOSIS — Z7984 Long term (current) use of oral hypoglycemic drugs: Secondary | ICD-10-CM | POA: Insufficient documentation

## 2021-12-20 DIAGNOSIS — Z7901 Long term (current) use of anticoagulants: Secondary | ICD-10-CM

## 2021-12-20 DIAGNOSIS — E162 Hypoglycemia, unspecified: Secondary | ICD-10-CM | POA: Diagnosis present

## 2021-12-20 DIAGNOSIS — Z87891 Personal history of nicotine dependence: Secondary | ICD-10-CM | POA: Insufficient documentation

## 2021-12-20 DIAGNOSIS — W19XXXA Unspecified fall, initial encounter: Secondary | ICD-10-CM | POA: Insufficient documentation

## 2021-12-20 DIAGNOSIS — R55 Syncope and collapse: Secondary | ICD-10-CM | POA: Diagnosis not present

## 2021-12-20 DIAGNOSIS — G4733 Obstructive sleep apnea (adult) (pediatric): Secondary | ICD-10-CM | POA: Diagnosis present

## 2021-12-20 DIAGNOSIS — R4182 Altered mental status, unspecified: Secondary | ICD-10-CM | POA: Diagnosis not present

## 2021-12-20 DIAGNOSIS — E785 Hyperlipidemia, unspecified: Secondary | ICD-10-CM | POA: Diagnosis present

## 2021-12-20 DIAGNOSIS — R109 Unspecified abdominal pain: Secondary | ICD-10-CM

## 2021-12-20 DIAGNOSIS — Z79899 Other long term (current) drug therapy: Secondary | ICD-10-CM | POA: Insufficient documentation

## 2021-12-20 DIAGNOSIS — Z951 Presence of aortocoronary bypass graft: Secondary | ICD-10-CM | POA: Insufficient documentation

## 2021-12-20 DIAGNOSIS — Y92009 Unspecified place in unspecified non-institutional (private) residence as the place of occurrence of the external cause: Secondary | ICD-10-CM | POA: Insufficient documentation

## 2021-12-20 DIAGNOSIS — E7800 Pure hypercholesterolemia, unspecified: Secondary | ICD-10-CM

## 2021-12-20 DIAGNOSIS — I10 Essential (primary) hypertension: Secondary | ICD-10-CM | POA: Diagnosis present

## 2021-12-20 LAB — COMPREHENSIVE METABOLIC PANEL
ALT: 16 U/L (ref 0–44)
AST: 25 U/L (ref 15–41)
Albumin: 3.4 g/dL — ABNORMAL LOW (ref 3.5–5.0)
Alkaline Phosphatase: 71 U/L (ref 38–126)
Anion gap: 8 (ref 5–15)
BUN: 20 mg/dL (ref 8–23)
CO2: 26 mmol/L (ref 22–32)
Calcium: 9.6 mg/dL (ref 8.9–10.3)
Chloride: 105 mmol/L (ref 98–111)
Creatinine, Ser: 0.8 mg/dL (ref 0.44–1.00)
GFR, Estimated: >60 mL/min (ref >60)
Glucose, Bld: 211 mg/dL — ABNORMAL HIGH (ref 70–99)
Potassium: 4.3 mmol/L (ref 3.5–5.1)
Sodium: 139 mmol/L (ref 135–145)
Total Bilirubin: 0.3 mg/dL (ref 0.3–1.2)
Total Protein: 6.7 g/dL (ref 6.5–8.1)

## 2021-12-20 LAB — URINALYSIS, ROUTINE W REFLEX MICROSCOPIC
Bilirubin Urine: NEGATIVE
Glucose, UA: NEGATIVE mg/dL
Hgb urine dipstick: NEGATIVE
Ketones, ur: NEGATIVE mg/dL
Leukocytes,Ua: NEGATIVE
Nitrite: NEGATIVE
Protein, ur: NEGATIVE mg/dL
Specific Gravity, Urine: 1.02 (ref 1.005–1.030)
pH: 5 (ref 5.0–8.0)

## 2021-12-20 LAB — CBC WITH DIFFERENTIAL/PLATELET
Abs Immature Granulocytes: 0.09 10*3/uL — ABNORMAL HIGH (ref 0.00–0.07)
Basophils Absolute: 0.1 10*3/uL (ref 0.0–0.1)
Basophils Relative: 1 %
Eosinophils Absolute: 0.1 10*3/uL (ref 0.0–0.5)
Eosinophils Relative: 1 %
HCT: 40.3 % (ref 36.0–46.0)
Hemoglobin: 12.6 g/dL (ref 12.0–15.0)
Immature Granulocytes: 1 %
Lymphocytes Relative: 25 %
Lymphs Abs: 1.8 10*3/uL (ref 0.7–4.0)
MCH: 27.9 pg (ref 26.0–34.0)
MCHC: 31.3 g/dL (ref 30.0–36.0)
MCV: 89.2 fL (ref 80.0–100.0)
Monocytes Absolute: 0.4 10*3/uL (ref 0.1–1.0)
Monocytes Relative: 6 %
Neutro Abs: 4.7 10*3/uL (ref 1.7–7.7)
Neutrophils Relative %: 66 %
Platelets: 262 10*3/uL (ref 150–400)
RBC: 4.52 MIL/uL (ref 3.87–5.11)
RDW: 14.5 % (ref 11.5–15.5)
WBC: 7.2 10*3/uL (ref 4.0–10.5)
nRBC: 0 % (ref 0.0–0.2)

## 2021-12-20 LAB — TSH: TSH: 1.616 u[IU]/mL (ref 0.350–4.500)

## 2021-12-20 LAB — CBG MONITORING, ED
Glucose-Capillary: 141 mg/dL — ABNORMAL HIGH (ref 70–99)
Glucose-Capillary: 26 mg/dL — CL (ref 70–99)
Glucose-Capillary: 147 mg/dL — ABNORMAL HIGH (ref 70–99)
Glucose-Capillary: 182 mg/dL — ABNORMAL HIGH (ref 70–99)
Glucose-Capillary: 76 mg/dL (ref 70–99)
Glucose-Capillary: 120 mg/dL — ABNORMAL HIGH (ref 70–99)
Glucose-Capillary: 188 mg/dL — ABNORMAL HIGH (ref 70–99)

## 2021-12-20 LAB — BRAIN NATRIURETIC PEPTIDE: B Natriuretic Peptide: 101.9 pg/mL — ABNORMAL HIGH (ref 0.0–100.0)

## 2021-12-20 LAB — TROPONIN I (HIGH SENSITIVITY)
Troponin I (High Sensitivity): 5 ng/L (ref <18)
Troponin I (High Sensitivity): 6 ng/L (ref <18)

## 2021-12-20 LAB — MAGNESIUM: Magnesium: 2.2 mg/dL (ref 1.7–2.4)

## 2021-12-20 LAB — LIPASE, BLOOD: Lipase: 24 U/L (ref 11–51)

## 2021-12-20 LAB — CK: Total CK: 74 U/L (ref 38–234)

## 2021-12-20 MED ORDER — FENTANYL CITRATE PF 50 MCG/ML IJ SOSY
50.0000 ug | PREFILLED_SYRINGE | Freq: Once | INTRAMUSCULAR | Status: AC
  Administered 2021-12-20: 50 ug via INTRAVENOUS
  Filled 2021-12-20: qty 1

## 2021-12-20 MED ORDER — DEXTROSE 50 % IV SOLN: 1.0000 | Freq: Once | INTRAVENOUS | Status: AC

## 2021-12-20 MED ORDER — DEXTROSE 50 % IV SOLN
INTRAVENOUS | Status: AC
  Administered 2021-12-20: 50 mL via INTRAVENOUS
  Filled 2021-12-20: qty 50

## 2021-12-20 MED ORDER — DEXTROSE 10 % IV SOLN: INTRAVENOUS | Status: DC

## 2021-12-20 MED ORDER — IOHEXOL 350 MG/ML SOLN
75.0000 mL | Freq: Once | INTRAVENOUS | Status: AC | PRN
  Administered 2021-12-20: 75 mL via INTRAVENOUS

## 2021-12-20 NOTE — ED Notes (Signed)
Pt reports that she is a diabetic and took her insulin last night, but did not eat breakfast.

## 2021-12-20 NOTE — Assessment & Plan Note (Signed)
Continue butyryl as needed

## 2021-12-20 NOTE — Assessment & Plan Note (Signed)
Orders  CPAP nightly

## 2021-12-20 NOTE — ED Notes (Signed)
Pt to be placed on bair hugger.

## 2021-12-20 NOTE — ED Notes (Signed)
To ct

## 2021-12-20 NOTE — ED Notes (Signed)
Pt c/o L CP no radiation described as squeezing. Notified EDP, obtained EKG, rechecked pt CBG

## 2021-12-20 NOTE — ED Notes (Signed)
Called lab to have magnesium added to previous collection. Per lab, they will add it on

## 2021-12-20 NOTE — Assessment & Plan Note (Signed)
Resume aspirin 81 mg p.o. daily When able resume Toprol

## 2021-12-20 NOTE — H&P (Signed)
[name]:[id number] DOB: 1940/11/09 DOA: 12/20/2021     PCP: Binnie Rail, MD   Outpatient Specialists:  CARDS:Weaver, Blair Dolphin, PA    GI  Dr.  Chryl Heck) Ladene Artist, MD   Endocrinology [facility], [place]   Patient arrived to ER on 12/20/21 at [phone] Referred by Attending Drenda Freeze, MD   Patient coming from:    home Lives  With family    Chief Complaint:   Chief Complaint  Patient presents with   Unwitnessed Fall   Hypoglycemia    HPI: [name] is a 81 y.o. female with medical history significant of CAD CABG, DM1, HLD, HTN, COPD, OSA    Presented with  fall and head trauma Recently seen endocrinology nad increased her humalog  Had a syncopal event and hit her head  Presented with BG in 20's and hypothermic  Started on D10 drip Hit the back pf her head She did not eat during the day  Initially reported RUQ/epigastric pain  Was seen 2 wks ago at ER fro bronchitis and was treated with augmentin and prednisone  Has not ahd a good appetie lost 30 lb over past few months  She squit smoking 60 y ago, drank when was younger not now Regarding pertinent Chronic problems:       HTN on toprol, imdur diltiazem, spironolactone   chronic CHF diastolic - last echo 94/49/6759 Hyperdynamic left ventricular function.  EF is greater than 75%.  Diastolic function could not be evaluated     CAD  - On Aspirin, statin, betablocker, Plavix                 - followed by cardiology                - last cardiac cath 2017      DM 1 -  Lab Results  Component Value Date   HGBA1C 6.9 (A) 12/18/2021   on insulin,       Asthma -well   controlled on home inhalers      OSA -on   CPAP        Chronic anemia - baseline hg Hemoglobin & Hematocrit  Recent Labs    10/26/21 1646 12/09/21 1301 12/20/21 0910  HGB 11.4* 12.5 12.6    While in ER:   Persistent intermitted hypoglycemia Hypothermia has resolved   CT HEAD/neck   NON acute  CXR -  NON  acute  [place] RUQ - . Gallstones. No secondary signs of acute cholecystitis. 2. Slight nodular contour of the liver raising the possibility of cirrhosis. CTabd/pelvis -  nonacute Colonic diverticulosis with no acute diverticulitis. 2. Aortic Atherosclerosis (ICD10-I70.0) including coronary calcification severe mitral annular calcification.    Following Medications were ordered in ER: Medications  dextrose 10 % infusion ( Intravenous New Bag/Given 12/20/21 1831)  dextrose 50 % solution 50 mL (50 mLs Intravenous Given by Other 12/20/21 0848)  fentaNYL (SUBLIMAZE) injection 50 mcg (50 mcg Intravenous Given 12/20/21 1057)  fentaNYL (SUBLIMAZE) injection 50 mcg (50 mcg Intravenous Given 12/20/21 1652)  iohexol (OMNIPAQUE) 350 MG/ML injection 75 mL (75 mLs Intravenous Contrast Given 12/20/21 1730)       ED Triage Vitals  Enc Vitals Group     BP 12/20/21 0848 (!) 132/106     Pulse Rate 12/20/21 0849 65     Resp 12/20/21 0849 (!) 21     Temp 12/20/21 0921 (!) 92.8 F (33.8 C)  Temp Source 12/20/21 0921 Rectal     SpO2 12/20/21 0849 92 %     Weight 12/20/21 0855 172 lb (78 kg)     Height 12/20/21 0855 '5\' 2"'$  (1.575 m)     Head Circumference --      Peak Flow --      Pain Score 12/20/21 0854 8     Pain Loc --      Pain Edu? --      Excl. in [place]? --   TMAX(24)@     _________________________________________ Significant initial  Findings: Abnormal Labs Reviewed  CBC WITH DIFFERENTIAL/PLATELET - Abnormal; Notable for the following components:      Result Value   Abs Immature Granulocytes 0.09 (*)    All other components within normal limits  COMPREHENSIVE METABOLIC PANEL - Abnormal; Notable for the following components:   Glucose, Bld 211 (*)    Albumin 3.4 (*)    All other components within normal limits  BRAIN NATRIURETIC PEPTIDE - Abnormal; Notable for the following components:   B Natriuretic Peptide 101.9 (*)    All other components within normal limits  CBG MONITORING, ED -  Abnormal; Notable for the following components:   Glucose-Capillary 26 (*)    All other components within normal limits  CBG MONITORING, ED - Abnormal; Notable for the following components:   Glucose-Capillary 141 (*)    All other components within normal limits  CBG MONITORING, ED - Abnormal; Notable for the following components:   Glucose-Capillary 147 (*)    All other components within normal limits  CBG MONITORING, ED - Abnormal; Notable for the following components:   Glucose-Capillary 182 (*)    All other components within normal limits    _________________________ Troponin 6 ECG: Ordered Personally reviewed and interpreted by me showing: HR : 64 Rhythm: Sinus rhythm Right bundle branch block when compared to prior,  QTC 527     The recent clinical data is shown below. Vitals:   12/20/21 1615 12/20/21 1645 12/20/21 1700 12/20/21 1823  BP: 122/62 124/62 119/63   Pulse: 71 71 81   Resp: '15 16 16   '$ Temp:    98.8 F (37.1 C)  TempSrc:      SpO2: 94% 94% 93%   Weight:      Height:        WBC     Component Value Date/Time   WBC 7.2 12/20/2021 0910   LYMPHSABS 1.8 12/20/2021 0910   LYMPHSABS 1.1 03/07/2019 0000   MONOABS 0.4 12/20/2021 0910   EOSABS 0.1 12/20/2021 0910   EOSABS 0.0 03/07/2019 0000   BASOSABS 0.1 12/20/2021 0910   BASOSABS 0.0 03/07/2019 0000       UA   no evidence of UTI    Urine analysis:    Component Value Date/Time   COLORURINE YELLOW 12/20/2021 [place] 12/20/2021 1446   LABSPEC 1.020 12/20/2021 1446   PHURINE 5.0 12/20/2021 1446   GLUCOSEU NEGATIVE 12/20/2021 1446   GLUCOSEU NEGATIVE 09/09/2020 1549   HGBUR NEGATIVE 12/20/2021 1446   BILIRUBINUR NEGATIVE 12/20/2021 1446   KETONESUR NEGATIVE 12/20/2021 1446   PROTEINUR NEGATIVE 12/20/2021 1446   UROBILINOGEN 0.2 09/09/2020 1549   NITRITE NEGATIVE 12/20/2021 1446   LEUKOCYTESUR NEGATIVE 12/20/2021 1446   ______________________________________ Hospitalist was  called for admission for   Hypoglycemia syncope   The following Work up has been ordered so far:  Orders Placed This Encounter  Procedures   Urine Culture   CT HEAD  WO CONTRAST (5MM)   CT Cervical Spine Wo Contrast   DG Chest Portable 1 View   US Abdomen Limited RUQ (LIVER/GB)   CT ABDOMEN PELVIS W CONTRAST   CBC with Differential   Comprehensive metabolic panel   Lipase, blood   Urinalysis, Routine w reflex microscopic   TSH   Brain natriuretic peptide   CK   Magnesium   Cardiac monitoring   Apply warming blanket (Bair Hugger)   Consult to hospitalist   POC CBG, ED   CBG monitoring, ED   POC CBG, ED   CBG monitoring, ED   CBG monitoring, ED   EKG 12-Lead   EKG 12-Lead     OTHER Significant initial  Findings:  labs showing:    Recent Labs  Lab 12/20/21 0910 12/20/21 1047  NA 139  --   K 4.3  --   CO2 26  --   GLUCOSE 211*  --   BUN 20  --   CREATININE 0.80  --   CALCIUM 9.6  --   MG  --  2.2    Cr   stable,   Lab Results  Component Value Date   CREATININE 0.80 12/20/2021   CREATININE 0.80 12/09/2021   CREATININE 0.93 10/26/2021    Recent Labs  Lab 12/20/21 0910  AST 25  ALT 16  ALKPHOS 71  BILITOT 0.3  PROT 6.7  ALBUMIN 3.4*   Lab Results  Component Value Date   CALCIUM 9.6 12/20/2021   PHOS 3.5 12/29/2009          Plt: Lab Results  Component Value Date   PLT 262 12/20/2021    COVID-19 Labs  No results for input(s): "DDIMER", "FERRITIN", "LDH", "CRP" in the last 72 hours.  Lab Results  Component Value Date   SARSCOV2NAA NEGATIVE 12/09/2021   [place] NEGATIVE 08/24/2020   SARSCOV2NAA Not Detected 03/21/2019   SARSCOV2NAA Not Detected 03/07/2019     Recent Labs  Lab 12/20/21 0910  WBC 7.2  NEUTROABS 4.7  HGB 12.6  HCT 40.3  MCV 89.2  PLT 262    HG/HCT   stable,      Component Value Date/Time   HGB 12.6 12/20/2021 0910   HGB 12.3 07/22/2021 1307   HCT 40.3 12/20/2021 0910   HCT 37.9 07/22/2021 1307   MCV  89.2 12/20/2021 0910   MCV 83 07/22/2021 1307     Recent Labs  Lab 12/20/21 0910  LIPASE 24     Cardiac Panel (last 3 results) Recent Labs    12/20/21 1047  CKTOTAL 74    .car BNP (last 3 results) Recent Labs    10/27/21 0803 12/20/21 0858  BNP 275.1* 101.9*      DM  labs:  HbA1C: Recent Labs    03/16/21 1615 10/05/21 1354 12/18/21 1358  HGBA1C 9.5* 8.7* 6.9*       CBG (last 3)  Recent Labs    12/20/21 1045 12/20/21 1401 12/20/21 1820  GLUCAP 147* 182* 76          Cultures:    Component Value Date/Time   SDES URINE, CLEAN CATCH 08/24/2020 1826   SPECREQUEST  08/24/2020 1826    NONE Performed at [facility] [address] [place],  [address]    CULT >=100,000 COLONIES/mL ESCHERICHIA COLI (A) 08/24/2020 1826   REPTSTATUS 08/27/2020 FINAL 08/24/2020 1826     Radiological Exams on Admission: CT ABDOMEN PELVIS W CONTRAST  Result Date: 12/20/2021 CLINICAL DATA:  Abdominal pain, acute, nonlocalized EXAM: CT ABDOMEN AND PELVIS WITH CONTRAST TECHNIQUE: Multidetector CT imaging of the abdomen and pelvis was performed using the standard protocol following bolus administration of intravenous contrast. RADIATION DOSE REDUCTION: This exam was performed according to the departmental dose-optimization program which includes automated exposure control, adjustment of the mA and/or kV according to patient size and/or use of iterative reconstruction technique. CONTRAST:  58m OMNIPAQUE IOHEXOL 350 MG/ML SOLN COMPARISON:  CT abdomen pelvis 01/04/2017 FINDINGS: Lower chest: Severe mitral annular calcifications. Coronary artery calcifications. Hepatobiliary: No focal liver abnormality. No gallstones, gallbladder wall thickening, or pericholecystic fluid. No biliary dilatation. Pancreas: No focal lesion. Normal pancreatic contour. No surrounding inflammatory changes. No main pancreatic ductal dilatation. Spleen: Normal in size. Stable nonspecific pericentimeter  hypodensity. Adrenals/Urinary Tract: No adrenal nodule bilaterally. Bilateral kidneys enhance symmetrically. Fluid density lesions within the bilateral kidneys likely represent simple renal cysts. The left lesion has increased in size measuring up to 5.3 cm (from 4 cm in 2018). Simple renal cysts, in the absence of clinically indicated signs/symptoms, require no independent follow-up. No hydronephrosis. No hydroureter. The urinary bladder is unremarkable. On delayed imaging, there is no urothelial wall thickening and there are no filling defects in the opacified portions of the bilateral collecting systems or ureters. Stomach/Bowel: Stomach is within normal limits. Redemonstration of a fat density along the gastric pylorus/antrum likely represents a benign lipomatous mural lesion. No evidence of bowel wall thickening or dilatation. Colonic diverticulosis. The appendix is not definitely identified with no inflammatory changes in the right lower quadrant to suggest acute appendicitis. Vascular/Lymphatic: No abdominal aorta or iliac aneurysm. Severe atherosclerotic plaque of the aorta and its branches. No abdominal, pelvic, or inguinal lymphadenopathy. Reproductive: Status post hysterectomy. No adnexal masses. Other: No intraperitoneal free fluid. No intraperitoneal free gas. No organized fluid collection. Musculoskeletal: No abdominal wall hernia or abnormality. No suspicious lytic or blastic osseous lesions. No acute displaced fracture. Grade 1 anterolisthesis of L4 on L5. IMPRESSION: 1. Colonic diverticulosis with no acute diverticulitis. 2. Aortic Atherosclerosis (ICD10-I70.0) including coronary calcification severe mitral annular calcification. Electronically Signed   By: MIven FinnM.D.   On: 12/20/2021 18:13   [place]domen Limited RUQ (LIVER/GB)  Result Date: 12/20/2021 CLINICAL DATA:  Right upper quadrant pain EXAM: ULTRASOUND ABDOMEN LIMITED RIGHT UPPER QUADRANT COMPARISON:  CT AP 01/05/2017 FINDINGS:  Gallbladder: Gallstones identified which measure up to 7 mm. No gallbladder wall thickening, pericholecystic fluid or sonographic Murphy's sign. Common bile duct: Diameter: 3 mm.  No intrahepatic bile duct dilatation. Liver: Contour of the liver appears slightly nodular. No focal lesion identified. Within normal limits in parenchymal echogenicity. Portal vein is patent on color Doppler imaging with normal direction of blood flow towards the liver. Other: None. IMPRESSION: 1. Gallstones. No secondary signs of acute cholecystitis. 2. Slight nodular contour of the liver raising the possibility of cirrhosis. Electronically Signed   By: TKerby MoorsM.D.   On: 12/20/2021 12:54   CT HEAD WO CONTRAST (5MM)  Result Date: 12/20/2021 CLINICAL DATA:  Head trauma. Un witnessed fall. Patient is on Eliquis. Complains of posterior head pain. EXAM: CT HEAD WITHOUT CONTRAST CT CERVICAL SPINE WITHOUT CONTRAST TECHNIQUE: Multidetector CT imaging of the head and cervical spine was performed following the standard protocol without intravenous contrast. Multiplanar CT image reconstructions of the cervical spine were also generated. RADIATION DOSE REDUCTION: This exam was performed according to the departmental dose-optimization program which includes automated exposure control, adjustment of the mA and/or kV according to patient  size and/or use of iterative reconstruction technique. COMPARISON:  Brain MRI 11/28/2021 FINDINGS: CT HEAD FINDINGS Brain: No evidence of acute infarction, hemorrhage, hydrocephalus, extra-axial collection or mass lesion/mass effect. Vascular: No hyperdense vessel or unexpected calcification. Skull: No hyperdense vessel or unexpected calcification. Sinuses/Orbits: No acute finding. Other: None CT CERVICAL SPINE FINDINGS Alignment: Detail is diminished due to motion artifact. Stepwise anterolisthesis of C4 on C5 and C5 on C6 is likely related to chronic cervical spondylosis. There are no signs of acute  posttraumatic malalignment of the cervical spine. Skull base and vertebrae: No acute fracture or dislocation. Soft tissues and spinal canal: No prevertebral fluid or swelling. No visible canal hematoma. Disc levels: Multi level disc space narrowing and endplate spurring is noted throughout the cervical spine. This is most severe at the C6-7 level Upper chest: No acute findings.  Centrilobular emphysema. Other: None IMPRESSION: 1. No evidence for acute intracranial abnormality. 2. No evidence for acute cervical spine fracture or posttraumatic malalignment. 3. Multilevel degenerative disc disease and cervical spondylosis. 4.  Emphysema (ICD10-J43.9). Electronically Signed   By: Kerby Moors M.D.   On: 12/20/2021 10:08   CT Cervical Spine Wo Contrast  Result Date: 12/20/2021 CLINICAL DATA:  Head trauma. Un witnessed fall. Patient is on Eliquis. Complains of posterior head pain. EXAM: CT HEAD WITHOUT CONTRAST CT CERVICAL SPINE WITHOUT CONTRAST TECHNIQUE: Multidetector CT imaging of the head and cervical spine was performed following the standard protocol without intravenous contrast. Multiplanar CT image reconstructions of the cervical spine were also generated. RADIATION DOSE REDUCTION: This exam was performed according to the departmental dose-optimization program which includes automated exposure control, adjustment of the mA and/or kV according to patient size and/or use of iterative reconstruction technique. COMPARISON:  Brain MRI 11/28/2021 FINDINGS: CT HEAD FINDINGS Brain: No evidence of acute infarction, hemorrhage, hydrocephalus, extra-axial collection or mass lesion/mass effect. Vascular: No hyperdense vessel or unexpected calcification. Skull: No hyperdense vessel or unexpected calcification. Sinuses/Orbits: No acute finding. Other: None CT CERVICAL SPINE FINDINGS Alignment: Detail is diminished due to motion artifact. Stepwise anterolisthesis of C4 on C5 and C5 on C6 is likely related to chronic  cervical spondylosis. There are no signs of acute posttraumatic malalignment of the cervical spine. Skull base and vertebrae: No acute fracture or dislocation. Soft tissues and spinal canal: No prevertebral fluid or swelling. No visible canal hematoma. Disc levels: Multi level disc space narrowing and endplate spurring is noted throughout the cervical spine. This is most severe at the C6-7 level Upper chest: No acute findings.  Centrilobular emphysema. Other: None IMPRESSION: 1. No evidence for acute intracranial abnormality. 2. No evidence for acute cervical spine fracture or posttraumatic malalignment. 3. Multilevel degenerative disc disease and cervical spondylosis. 4.  Emphysema (ICD10-J43.9). Electronically Signed   By: Kerby Moors M.D.   On: 12/20/2021 10:08   DG Chest Portable 1 View  Result Date: 12/20/2021 CLINICAL DATA:  81 year old female with history of hypoglycemia and fall. EXAM: PORTABLE CHEST 1 VIEW COMPARISON:  Chest x-ray 02/18/2021. FINDINGS: Lung volumes are normal. No consolidative airspace disease. No pleural effusions. No pneumothorax. No pulmonary nodule or mass noted. Pulmonary vasculature and the cardiomediastinal silhouette are within normal limits. Atherosclerosis in the thoracic aorta. Status post right shoulder arthroplasty. Status post median sternotomy for CABG including LIMA. Bony thorax appears grossly intact. IMPRESSION: 1. No radiographic evidence of significant acute traumatic injury to the thorax. 2. Aortic atherosclerosis. Electronically Signed   By: Vinnie Langton M.D.   On: 12/20/2021 09:45  _______________________________________________________________________________________________________ Latest  Blood pressure 119/63, pulse 81, temperature 98.8 F (37.1 C), resp. rate 16, height '5\' 2"'$  (1.575 m), weight 78 kg, SpO2 93 %.   Vitals  labs and radiology finding personally reviewed  Review of Systems:    Pertinent positives include:  syncope  Constitutional:  No weight loss, night sweats, Fevers, chills, fatigue, weight loss  HEENT:  No headaches, Difficulty swallowing,Tooth/dental problems,Sore throat,  No sneezing, itching, ear ache, nasal congestion, post nasal drip,  Cardio-vascular:  No chest pain, Orthopnea, PND, anasarca, dizziness, palpitations.no Bilateral lower extremity swelling  GI:  No heartburn, indigestion, abdominal pain, nausea, vomiting, diarrhea, change in bowel habits, loss of appetite, melena, blood in stool, hematemesis Resp:  no shortness of breath at rest. No dyspnea on exertion, No excess mucus, no productive cough, No non-productive cough, No coughing up of blood.No change in color of mucus.No wheezing. Skin:  no rash or lesions. No jaundice GU:  no dysuria, change in color of urine, no urgency or frequency. No straining to urinate.  No flank pain.  Musculoskeletal:  No joint pain or no joint swelling. No decreased range of motion. No back pain.  Psych:  No change in mood or affect. No depression or anxiety. No memory loss.  Neuro: no localizing neurological complaints, no tingling, no weakness, no double vision, no gait abnormality, no slurred speech, no confusion  All systems reviewed and apart from [place] all are negative _______________________________________________________________________________________________ Past Medical History:   Past Medical History:  Diagnosis Date   Adenomatous colon polyp    Allergy    Anxiety    Asthma    Carotid artery disease ([facility])    carotid US 02/2017: bilat ICA 1-39%   Chronic diastolic CHF    Echo 10/6220: EF 65-70, Gr 2 DD, mild MS (mean 5), PASP 44   COPD    pt is unsure if has been officially diagnosed   Coronary artery disease    CABG '09- cathed 12/09, 9/10, 6/11, 3/14 and 12/13/16- medical Rx // cath 12/2016 - 2/3 grafts patent >> med Rx // Myoview 12/17: low risk   Diabetes mellitus    Gastroesophageal reflux disease    Hiatal hernia     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   Hyperlipidemia    Hypertension    PONV (postoperative nausea and vomiting)    Schatzki's ring    Shoulder injury    resolved after shoulder surgery   Sleep apnea    not on cpap      Past Surgical History:  Procedure Laterality Date   ABDOMINAL HYSTERECTOMY     APPENDECTOMY     came out with Hysterectomy   CARDIAC CATHETERIZATION  07/23/2009   EF 60%   CARDIAC CATHETERIZATION  10/11/2008   CARDIAC CATHETERIZATION  03/01/2007   EF 75-80%   CARDIOVASCULAR STRESS TEST  11/15/2007   EF 60%   COLONOSCOPY     CORONARY ARTERY BYPASS GRAFT     SEVERELY DISEASED SAPHENOUS VEIN GRAFT TO THE RIGHT CORONARY ARTERY BUT WITH FAIRLY WELL PRESERVED FLOW TO THE DISTAL RIGHT CORONARY ARTERY FROM THE NATIVE CIRCULATION-RESTART  CATH IN JUNE 2000, REVEALS MILD/MODERATE  CAD WITH GOOD FLOW DOWN HER LAD   ESOPHAGOGASTRODUODENOSCOPY     EYE SURGERY     bilateral cataract surgery with lens implant   LEFT HEART CATH AND CORS/GRAFTS ANGIOGRAPHY N/A 12/14/2016   Procedure: LEFT HEART CATH AND CORS/GRAFTS ANGIOGRAPHY;  Surgeon: Jettie Booze, MD;  Location:  [facility];  Service: Cardiovascular;  Laterality: N/A;   lense removal Left    POLYPECTOMY     RIGHT/LEFT HEART CATH AND CORONARY/GRAFT ANGIOGRAPHY N/A 07/28/2021   Procedure: RIGHT/LEFT HEART CATH AND CORONARY/GRAFT ANGIOGRAPHY;  Surgeon: Troy Sine, MD;  Location: [facility];  Service: Cardiovascular;  Laterality: N/A;   ROTATOR CUFF REPAIR     right and left   TONSILLECTOMY     age 31   TOTAL KNEE ARTHROPLASTY Right 02/20/2014   Procedure: RIGHT TOTAL KNEE ARTHROPLASTY;  Surgeon: Tobi Bastos, MD;  Location: [facility];  Service: Orthopedics;  Laterality: Right;   tumor removed kidney     UPPER GASTROINTESTINAL ENDOSCOPY     US ECHOCARDIOGRAPHY  03/08/2008   EF 55-60%    Social History:  Ambulatory   independently       reports that she quit smoking about 45 years  ago. Her smoking use included cigarettes. She started smoking about 66 years ago. She has a 10.50 pack-year smoking history. She has never used smokeless tobacco. She reports that she does not drink alcohol and does not use drugs.     Family History:   Family History  Problem Relation Age of Onset   Heart disease Maternal Grandfather    Heart failure Maternal Grandfather    Diabetes Maternal Grandfather    Heart attack Father    Diabetes Mother    Rheum arthritis Sister    Emphysema Paternal Uncle    Esophageal cancer Brother 43       she said he was born with it   Emphysema Paternal Aunt    Healthy Child    Neuropathy Neg Hx    Multiple sclerosis Neg Hx    Colon cancer Neg Hx    Colon polyps Neg Hx    Rectal cancer Neg Hx    Stomach cancer Neg Hx    ______________________________________________________________________________________________ Allergies: Allergies  Allergen Reactions   Amlodipine Other (See Comments)    hallucinations    Banana Nausea And Vomiting    Stomach pumped   Co Q10 [Coenzyme Q10] Other (See Comments)    Body cramps   Codeine Other (See Comments)    Hallucinate, loose identity and don't know who I am   Insulin Aspart Other (See Comments)    Unknown   Lisinopril Other (See Comments)    nose bleed   Metformin And Related Nausea And Vomiting   Morphine Other (See Comments)    Can not function, it immobilizes me    Pentazocine Nausea And Vomiting   Pravastatin Other (See Comments)    Hands locked up   Repatha [Evolocumab] Other (See Comments)    myalgias   Statins Other (See Comments)    Muscle cramps   Sulfa Antibiotics Swelling   Sulfonamide Derivatives Swelling   Tramadol Hcl Other (See Comments)    Dizziness, cant function      Prior to Admission medications   Medication Sig Start Date End Date Taking? Authorizing Provider  acetaminophen (TYLENOL) 325 MG tablet Take 650 mg by mouth every 6 (six) hours as needed for moderate pain or  headache.    [provider]  albuterol (PROVENTIL HFA;VENTOLIN HFA) 108 (90 BASE) MCG/ACT inhaler Inhale 2 puffs into the lungs every 6 (six) hours as needed. For shortness of breath. 03/06/12   Elsie Stain, MD  albuterol (PROVENTIL) (2.5 MG/3ML) 0.083% nebulizer solution USE 1 VIAL IN NEBULIZER 4 TIMES DAILY Patient taking differently: Take  2.5 mg by nebulization every 6 (six) hours as needed for wheezing or shortness of breath. 05/07/20   Mannam, Hart Robinsons, MD  albuterol (PROVENTIL) (2.5 MG/3ML) 0.083% nebulizer solution Take 3 mLs (2.5 mg total) by nebulization every 6 (six) hours as needed for wheezing or shortness of breath. 12/09/21   Blanchie Dessert, MD  alum & mag hydroxide-simeth (MAALOX/MYLANTA) 200-200-20 MG/5ML suspension Take 30 mLs as needed by mouth for indigestion or heartburn.    [provider]  amoxicillin-clavulanate (AUGMENTIN) 875-125 MG tablet Take 1 tablet by mouth every 12 (twelve) hours. Patient not taking: Reported on 12/18/2021 12/09/21   Blanchie Dessert, MD  aspirin EC 81 MG tablet Take 1 tablet (81 mg total) by mouth daily. 12/10/16   Richardson Dopp T, PA-C  baclofen (LIORESAL) 20 MG tablet TAKE 2 TABLETS BY MOUTH 2 TIMES DAILY. Patient taking differently: Take 40 mg by mouth 2 (two) times daily. 03/13/21   Binnie Rail, MD  Bempedoic Acid-Ezetimibe (NEXLIZET) 180-10 MG TABS Take 1 tablet by mouth daily. 08/26/21   Richardson Dopp T, PA-C  calcium carbonate (OS-CAL) 1250 (500 Ca) MG chewable tablet Chew 500 mg by mouth daily as needed for heartburn.    [provider]  Cholecalciferol (VITAMIN D) 50 MCG (2000 UT) tablet Take 4,000 Units by mouth daily.    [provider]  Continuous Blood Gluc Receiver (FREESTYLE LIBRE 2 READER) DEVI 1 Device by Does not apply route every 14 (fourteen) days. 12/18/21   Shamleffer, Melanie Crazier, MD  Continuous Blood Gluc Sensor (FREESTYLE LIBRE 2 SENSOR) MISC 1 Device by Does not apply route every 14  (fourteen) days. 12/18/21   Shamleffer, Melanie Crazier, MD  Cyanocobalamin (VITAMIN B-12 IJ) Inject 1,000 mcg as directed every 30 (thirty) days.    [provider]  diltiazem (CARDIZEM CD) 240 MG 24 hr capsule Take 1 capsule (240 mg total) by mouth daily. 07/17/21   Binnie Rail, MD  fluticasone (FLONASE) 50 MCG/ACT nasal spray Place 2 sprays into both nostrils daily.    [provider]  Fluticasone-Umeclidin-Vilant (TRELEGY ELLIPTA) 200-62.5-25 MCG/ACT AEPB Inhale 1 puff into the lungs daily at 6 (six) AM. 05/01/21   Parrett, Fonnie Mu, NP  furosemide (LASIX) 40 MG tablet Take 1 tablet (40 mg total) by mouth daily. 10/05/21 01/03/22  Binnie Rail, MD  glucose blood test strip Use as instructed to test blood sugars up to 4 times daily 11/04/21   Binnie Rail, MD  guaiFENesin (ROBITUSSIN) 100 MG/5ML liquid Take 5-10 mLs (100-200 mg total) by mouth every 4 (four) hours as needed for cough or to loosen phlegm. 12/09/21   Jeanell Sparrow, DO  insulin lispro protamine-lispro (HUMALOG 75/25 MIX) (75-25) 100 UNIT/ML SUSP injection Inject 24 Units into the skin 2 (two) times daily with a meal. 10 units at lunch depending on bs reading 12/18/21   Shamleffer, Melanie Crazier, MD  Insulin Syringe-Needle U-100 (INSULIN SYRINGE .3CC/29GX1") 29G X 1" 0.3 ML MISC 1 Device by Does not apply route in the morning and at bedtime. 12/18/21   Shamleffer, Melanie Crazier, MD  isosorbide mononitrate (IMDUR) 60 MG 24 hr tablet Take one tablet ( 60 mg ) twice daily 8 hours apart. Patient taking differently: 60 mg 2 (two) times daily. Take one tablet ( 60 mg ) twice daily 8 hours apart. 05/14/20   Richardson Dopp T, PA-C  meclizine (ANTIVERT) 25 MG tablet Take 25 mg by mouth 2 (two) times daily as needed for dizziness.  [provider]  metoprolol succinate (TOPROL-XL) 100 MG 24 hr tablet TAKE 1 TABLET BY MOUTH EVERY DAY Patient taking differently: Take 100 mg by mouth daily. 02/23/21   Nahser, Wonda Cheng,  MD  montelukast (SINGULAIR) 5 MG chewable tablet Chew 5 mg by mouth at bedtime.    [provider]  Multiple Vitamin (MULTIVITAMIN WITH MINERALS) TABS Take 1 tablet by mouth daily.    [provider]  nitroGLYCERIN (NITROSTAT) 0.4 MG SL tablet Place 1 tablet (0.4 mg total) under the tongue every 5 (five) minutes as needed for chest pain. 07/22/21   Belva Crome, MD  nortriptyline (PAMELOR) 50 MG capsule Take by mouth. 11/15/21   [provider]  pantoprazole (PROTONIX) 40 MG tablet Take 40 mg by mouth daily. 01/22/15   [provider]  predniSONE (DELTASONE) 20 MG tablet Take 2 tablets (40 mg total) by mouth daily. Patient not taking: Reported on 12/18/2021 12/09/21   Blanchie Dessert, MD  spironolactone (ALDACTONE) 25 MG tablet TAKE 1 TABLET BY MOUTH EVERY DAY 09/15/21   Nahser, Wonda Cheng, MD  Study - ORION 4 - inclisiran 300 mg/1.55m or placebo SQ injection (PI-[place]uc[place]) Inject 300 mg into the skin every 6 (six) months. 01/17/18   [provider]  tapentadol (NUCYNTA) 50 MG tablet     [provider]  temazepam (RESTORIL) 30 MG capsule Take 30 mg by mouth daily. 09/16/21   [provider]  topiramate (TOPAMAX) 25 MG tablet Take 1 tablet (25 mg total) by mouth at bedtime. 08/19/21   JPieter Partridge DO    ___________________________________________________________________________________________________ Physical Exam:    12/20/2021    5:00 PM 12/20/2021    4:45 PM 12/20/2021    4:15 PM  Vitals with BMI  Systolic [id number] Diastolic 63 62 62  Pulse 81 71 71     1. General:  in No  Acute distress   Chronically ill   -appearing 2. Psychological: Alert and   Oriented 3. Head/ENT:    Dry Mucous Membranes                          Head Non traumatic, neck supple                          Poor Dentition 4. SKIN:  decreased Skin turgor,  Skin clean Dry and intact no rash 5. Heart: Regular rate and rhythm no  Murmur, no Rub or  gallop 6. Lungs:  no wheezes or crackles   7. Abdomen: Soft, superficially muscle tender,   8. Lower extremities: no clubbing, cyanosis, no  edema 9. Neurologically Grossly intact, moving all 4 extremities equally  10. MSK: Normal range of motion    Chart has been reviewed  ______________________________________________________________________________________________  Assessment/Plan  81y.o. female with medical history significant of CAD CABG, DM1, HLD, HTN, COPD, oSA   Admitted for   Hypoglycemia, syncope     Present on Admission:  Hypoglycemia  Type 1 diabetes mellitus ([place]  Hyperlipidemia  Extrinsic asthma  GERD  Essential hypertension  CAD (coronary artery disease)  OSA (obstructive sleep apnea) with hypoxia  Chronic diastolic CHF (congestive heart failure) ([place]  Syncope  Cirrhosis ([place]     Hypoglycemia Monitor for presence of hypoglycemia continue D10  Type 1 diabetes mellitus ([place] Given significant hypoglycemia will have to hold off on insulin for right now until able to control blood sugars.  Order diabetic coordinator consult once blood sugar stabilizes will likely need to resume insulin but at a much lower doses  Hyperlipidemia Chronic stable  Extrinsic asthma Continue butyryl as needed  GERD Continue Protonix  Essential hypertension Allow permissive hypertension for now resume home medications as able to tolerate  CAD (coronary artery disease) Resume aspirin 81 mg p.o. daily When able resume Toprol  OSA (obstructive sleep apnea) with hypoxia Orders  CPAP nightly  Chronic anticoagulation Unclear why patient is on chronic anticoagulation.  Would benefit from further cardiology evaluation current appears to be in sinus rhythm no records of indicating the patient have any A-fib but may need further investigation.  Patient states she has been on anticoagulation ever since her CABG  Chronic diastolic CHF (congestive heart failure) ([place]) Gently  rehydrate current appears to be slightly on the dry side  Syncope In the setting of hypoglycemia blood sugar as well as 26.  Continue to monitor Denies any chest pain shortness of breath Recent echogram on Monday 1 month ago Troponin unremarkable Patient does not endorse any chest pain has been having abdominal pain which appears to be musculoskeletal by exam   Cirrhosis ([place]) Ultrasound of the liver suggestive of possible cirrhosis obtain hepatitis serologies continue to follow may need further follow-up with GI current abdominal pain more consistent musculoskeletal   Other plan as per orders.  DVT prophylaxis:  SCD      Code Status:    Code Status: Prior FULL CODE   as per patient   I had personally discussed CODE STATUS with patient    Family Communication:   Family not at  Bedside    Disposition Plan:        To home once workup is complete and patient is stable   Following barriers for discharge:                           hypoglycemia                     Would benefit from PT/OT eval prior to DC  Ordered                                     Diabetes care coordinator                                   Consults called: none    Admission status:  ED Disposition     ED Disposition  Admit   Condition  --   Central: [place] [100100]  Level of Care: Progressive [102]  Admit to Progressive based on following criteria: MULTISYSTEM THREATS such as stable sepsis, metabolic/electrolyte imbalance with or without encephalopathy that is responding to early treatment.  May place patient in observation at [facility] or [place] if equivalent level of care is available:: No  Covid Evaluation: Asymptomatic - no recent exposure (last 10 days) testing not required  Diagnosis: Hypoglycemia [017793]  Admitting Physician: Toy Baker [[id number]]  Attending Physician: Toy Baker [[id number]]           Obs     Level of care         progressive tele indefinitely please discontinue once patient no longer qualifies [place]  12/20/2021, 8:46 PM    [facility]     after 2 AM please page floor coverage PA If 7AM-7PM, please contact the day team taking care of the patient using [email or web address]   Patient was evaluated in the context of the global COVID-19 pandemic, which necessitated consideration that the patient might be at risk for infection with the SARS-CoV-2 virus that causes COVID-19. Institutional protocols and algorithms that pertain to the evaluation of patients at risk for COVID-19 are in a state of rapid change based on information released by regulatory bodies including the CDC and federal and state organizations. These policies and algorithms were followed during the patient's care.

## 2021-12-20 NOTE — ED Provider Notes (Signed)
  Physical Exam  BP 119/63   Pulse 81   Temp 98.8 F (37.1 C)   Resp 16   Ht '5\' 2"'$  (1.575 m)   Wt 78 kg   LMP  (LMP Unknown)   SpO2 93%   BMI 31.46 kg/m   Physical Exam  Procedures  Procedures  ED Course / MDM    Medical Decision Making Care assumed at 3 AM.  Patient is here after a fall.  Patient apparently syncopized.  She was hypothermic and hypoglycemic on arrival.  Patient was activated as a level 2 trauma and CT head and cervical spine were unremarkable.  Patient was given 1 amp of D50.  Her sugar came up and her hypothermia improved.  However during the ED stay, she developed abdominal pain which was not present initially.  Signed out pending CT abdomen pelvis and recheck blood sugar  6:55 PM Patient CT abdomen pelvis was unremarkable.  Her hypothermia now resolved.  However her glucose decreased to 76 from 180.  Patient was started on D10 drip.  Will be admitted for hypoglycemia likely causing her syncope.  CRITICAL CARE Performed by: Wandra Arthurs   Total critical care time: 30 minutes  Critical care time was exclusive of separately billable procedures and treating other patients.  Critical care was necessary to treat or prevent imminent or life-threatening deterioration.  Critical care was time spent personally by me on the following activities: development of treatment plan with patient and/or surrogate as well as nursing, discussions with consultants, evaluation of patient's response to treatment, examination of patient, obtaining history from patient or surrogate, ordering and performing treatments and interventions, ordering and review of laboratory studies, ordering and review of radiographic studies, pulse oximetry and re-evaluation of patient's condition.   Problems Addressed: Continuous severe abdominal pain: acute illness or injury Fall, initial encounter: acute illness or injury Hypoglycemia: acute illness or injury  Amount and/or Complexity of Data  Reviewed Labs: ordered. Decision-making details documented in ED Course. Radiology: ordered and independent interpretation performed. Decision-making details documented in ED Course.  Risk Prescription drug management.          Drenda Freeze, MD 12/20/21 [phone]

## 2021-12-20 NOTE — Assessment & Plan Note (Signed)
Monitor for presence of hypoglycemia continue D10

## 2021-12-20 NOTE — ED Provider Notes (Signed)
[facility] EMERGENCY DEPARTMENT Provider Note   CSN: [id number] Arrival date & time: 12/20/21  2633     History  Chief Complaint  Patient presents with   Unwitnessed Fall   Hypoglycemia    [name] is a 81 y.o. female.  The history is provided by the patient and medical records. No language interpreter was used.  Hypoglycemia Initial blood sugar:  26 Blood sugar after intervention:  200's Severity:  Severe Onset quality:  Unable to specify Progression:  Improving Chronicity:  New Diabetic status:  Controlled with insulin Context comment:  Medication changes Ineffective treatments:  None tried Associated symptoms: altered mental status (fall and LOC)   Associated symptoms: no dizziness, no shortness of breath, no sweats, no vomiting and no weakness        Home Medications Prior to Admission medications   Medication Sig Start Date End Date Taking? Authorizing Provider  acetaminophen (TYLENOL) 325 MG tablet Take 650 mg by mouth every 6 (six) hours as needed for moderate pain or headache.    [provider]  albuterol (PROVENTIL HFA;VENTOLIN HFA) 108 (90 BASE) MCG/ACT inhaler Inhale 2 puffs into the lungs every 6 (six) hours as needed. For shortness of breath. 03/06/12   Elsie Stain, MD  albuterol (PROVENTIL) (2.5 MG/3ML) 0.083% nebulizer solution USE 1 VIAL IN NEBULIZER 4 TIMES DAILY Patient taking differently: Take 2.5 mg by nebulization every 6 (six) hours as needed for wheezing or shortness of breath. 05/07/20   Mannam, Hart Robinsons, MD  albuterol (PROVENTIL) (2.5 MG/3ML) 0.083% nebulizer solution Take 3 mLs (2.5 mg total) by nebulization every 6 (six) hours as needed for wheezing or shortness of breath. 12/09/21   Blanchie Dessert, MD  alum & mag hydroxide-simeth (MAALOX/MYLANTA) 200-200-20 MG/5ML suspension Take 30 mLs as needed by mouth for indigestion or heartburn.    [provider]  amoxicillin-clavulanate (AUGMENTIN) 875-125  MG tablet Take 1 tablet by mouth every 12 (twelve) hours. Patient not taking: Reported on 12/18/2021 12/09/21   Blanchie Dessert, MD  aspirin EC 81 MG tablet Take 1 tablet (81 mg total) by mouth daily. 12/10/16   Richardson Dopp T, PA-C  baclofen (LIORESAL) 20 MG tablet TAKE 2 TABLETS BY MOUTH 2 TIMES DAILY. Patient taking differently: Take 40 mg by mouth 2 (two) times daily. 03/13/21   Binnie Rail, MD  Bempedoic Acid-Ezetimibe (NEXLIZET) 180-10 MG TABS Take 1 tablet by mouth daily. 08/26/21   Richardson Dopp T, PA-C  calcium carbonate (OS-CAL) 1250 (500 Ca) MG chewable tablet Chew 500 mg by mouth daily as needed for heartburn.    [provider]  Cholecalciferol (VITAMIN D) 50 MCG (2000 UT) tablet Take 4,000 Units by mouth daily.    [provider]  Continuous Blood Gluc Receiver (FREESTYLE LIBRE 2 READER) DEVI 1 Device by Does not apply route every 14 (fourteen) days. 12/18/21   Shamleffer, Melanie Crazier, MD  Continuous Blood Gluc Sensor (FREESTYLE LIBRE 2 SENSOR) MISC 1 Device by Does not apply route every 14 (fourteen) days. 12/18/21   Shamleffer, Melanie Crazier, MD  Cyanocobalamin (VITAMIN B-12 IJ) Inject 1,000 mcg as directed every 30 (thirty) days.    [provider]  diltiazem (CARDIZEM CD) 240 MG 24 hr capsule Take 1 capsule (240 mg total) by mouth daily. 07/17/21   Binnie Rail, MD  fluticasone (FLONASE) 50 MCG/ACT nasal spray Place 2 sprays into both nostrils daily.    [provider]  Fluticasone-Umeclidin-Vilant (TRELEGY ELLIPTA) 200-62.5-25 MCG/ACT AEPB Inhale  1 puff into the lungs daily at 6 (six) AM. 05/01/21   Parrett, Fonnie Mu, NP  furosemide (LASIX) 40 MG tablet Take 1 tablet (40 mg total) by mouth daily. 10/05/21 01/03/22  Binnie Rail, MD  glucose blood test strip Use as instructed to test blood sugars up to 4 times daily 11/04/21   Binnie Rail, MD  guaiFENesin (ROBITUSSIN) 100 MG/5ML liquid Take 5-10 mLs (100-200 mg total) by mouth every 4  (four) hours as needed for cough or to loosen phlegm. 12/09/21   Jeanell Sparrow, DO  insulin lispro protamine-lispro (HUMALOG 75/25 MIX) (75-25) 100 UNIT/ML SUSP injection Inject 24 Units into the skin 2 (two) times daily with a meal. 10 units at lunch depending on bs reading 12/18/21   Shamleffer, Melanie Crazier, MD  Insulin Syringe-Needle U-100 (INSULIN SYRINGE .3CC/29GX1") 29G X 1" 0.3 ML MISC 1 Device by Does not apply route in the morning and at bedtime. 12/18/21   Shamleffer, Melanie Crazier, MD  isosorbide mononitrate (IMDUR) 60 MG 24 hr tablet Take one tablet ( 60 mg ) twice daily 8 hours apart. Patient taking differently: 60 mg 2 (two) times daily. Take one tablet ( 60 mg ) twice daily 8 hours apart. 05/14/20   Richardson Dopp T, PA-C  meclizine (ANTIVERT) 25 MG tablet Take 25 mg by mouth 2 (two) times daily as needed for dizziness.    [provider]  metoprolol succinate (TOPROL-XL) 100 MG 24 hr tablet TAKE 1 TABLET BY MOUTH EVERY DAY Patient taking differently: Take 100 mg by mouth daily. 02/23/21   Nahser, Wonda Cheng, MD  montelukast (SINGULAIR) 5 MG chewable tablet Chew 5 mg by mouth at bedtime.    [provider]  Multiple Vitamin (MULTIVITAMIN WITH MINERALS) TABS Take 1 tablet by mouth daily.    [provider]  nitroGLYCERIN (NITROSTAT) 0.4 MG SL tablet Place 1 tablet (0.4 mg total) under the tongue every 5 (five) minutes as needed for chest pain. 07/22/21   Belva Crome, MD  nortriptyline (PAMELOR) 50 MG capsule Take by mouth. 11/15/21   [provider]  pantoprazole (PROTONIX) 40 MG tablet Take 40 mg by mouth daily. 01/22/15   [provider]  predniSONE (DELTASONE) 20 MG tablet Take 2 tablets (40 mg total) by mouth daily. Patient not taking: Reported on 12/18/2021 12/09/21   Blanchie Dessert, MD  spironolactone (ALDACTONE) 25 MG tablet TAKE 1 TABLET BY MOUTH EVERY DAY 09/15/21   Nahser, Wonda Cheng, MD  Study - ORION 4 - inclisiran 300 mg/1.29m or  placebo SQ injection (PI-Stuckey) Inject 300 mg into the skin every 6 (six) months. 01/17/18   [provider]  tapentadol (NUCYNTA) 50 MG tablet     [provider]  temazepam (RESTORIL) 30 MG capsule Take 30 mg by mouth daily. 09/16/21   [provider]  topiramate (TOPAMAX) 25 MG tablet Take 1 tablet (25 mg total) by mouth at bedtime. 08/19/21   JPieter Partridge DO      Allergies    Amlodipine, Banana, Co q10 [coenzyme q10], Codeine, Insulin aspart, Lisinopril, Metformin and related, Morphine, Pentazocine, Pravastatin, Repatha [evolocumab], Statins, Sulfa antibiotics, Sulfonamide derivatives, and Tramadol hcl    Review of Systems   Review of Systems  Constitutional:  Positive for chills and fatigue. Negative for diaphoresis and fever.  HENT:  Positive for congestion.   Respiratory:  Positive for cough. Negative for chest tightness, shortness of breath and wheezing.   Cardiovascular:  Negative for chest  pain, palpitations and leg swelling.  Gastrointestinal:  Negative for abdominal pain, constipation, diarrhea, nausea and vomiting.  Genitourinary:  Positive for frequency. Negative for dysuria.  Musculoskeletal:  Negative for back pain, neck pain and neck stiffness.  Skin:  Negative for rash and wound.  Neurological:  Positive for light-headedness and headaches. Negative for dizziness and weakness.  Psychiatric/Behavioral:  Negative for agitation and confusion.   All other systems reviewed and are negative.   Physical Exam Updated Vital Signs Pulse 66   Resp 18   Ht '5\' 2"'$  (1.575 m)   Wt 78 kg   LMP  (LMP Unknown)   SpO2 95%   BMI 31.46 kg/m  Physical Exam Vitals and nursing note reviewed.  Constitutional:      General: She is not in acute distress.    Appearance: She is well-developed. She is not ill-appearing, toxic-appearing or diaphoretic.  HENT:     Head: Normocephalic and atraumatic.     Nose: Rhinorrhea present. No congestion.     Mouth/Throat:      Mouth: Mucous membranes are moist.     Pharynx: No oropharyngeal exudate or posterior oropharyngeal erythema.  Eyes:     Extraocular Movements: Extraocular movements intact.     Conjunctiva/sclera: Conjunctivae normal.     Pupils: Pupils are equal, round, and reactive to light.  Cardiovascular:     Rate and Rhythm: Normal rate and regular rhythm.     Heart sounds: No murmur heard. Pulmonary:     Effort: Pulmonary effort is normal. No respiratory distress.     Breath sounds: Normal breath sounds. No wheezing, rhonchi or rales.  Chest:     Chest wall: Tenderness present.  Abdominal:     Palpations: Abdomen is soft.     Tenderness: There is no abdominal tenderness. There is no right CVA tenderness, left CVA tenderness, guarding or rebound.  Musculoskeletal:        General: No swelling or tenderness.     Cervical back: Neck supple.     Right lower leg: No edema.     Left lower leg: No edema.  Skin:    General: Skin is warm and dry.     Capillary Refill: Capillary refill takes less than 2 seconds.     Findings: No erythema or rash.  Neurological:     General: No focal deficit present.     Mental Status: She is alert.     Sensory: No sensory deficit.     Motor: No weakness.  Psychiatric:        Mood and Affect: Mood normal.     ED Results / Procedures / Treatments   Labs (all labs ordered are listed, but only abnormal results are displayed) Labs Reviewed  CBC WITH DIFFERENTIAL/PLATELET - Abnormal; Notable for the following components:      Result Value   Abs Immature Granulocytes 0.09 (*)    All other components within normal limits  COMPREHENSIVE METABOLIC PANEL - Abnormal; Notable for the following components:   Glucose, Bld 211 (*)    Albumin 3.4 (*)    All other components within normal limits  BRAIN NATRIURETIC PEPTIDE - Abnormal; Notable for the following components:   B Natriuretic Peptide 101.9 (*)    All other components within normal limits  CBG MONITORING,  ED - Abnormal; Notable for the following components:   Glucose-Capillary 26 (*)    All other components within normal limits  CBG MONITORING, ED - Abnormal; Notable for the following components:  Glucose-Capillary 141 (*)    All other components within normal limits  CBG MONITORING, ED - Abnormal; Notable for the following components:   Glucose-Capillary 147 (*)    All other components within normal limits  CBG MONITORING, ED - Abnormal; Notable for the following components:   Glucose-Capillary 182 (*)    All other components within normal limits  URINE CULTURE  LIPASE, BLOOD  URINALYSIS, ROUTINE W REFLEX MICROSCOPIC  TSH  CK  MAGNESIUM  TROPONIN I (HIGH SENSITIVITY)  TROPONIN I (HIGH SENSITIVITY)    EKG EKG Interpretation  Date/Time:  Sunday December 20 2021 08:49:07 EST Ventricular Rate:  64 PR Interval:  169 QRS Duration: 163 QT Interval:  510 QTC Calculation: 527 R Axis:   34 Text Interpretation: Sinus rhythm Right bundle branch block when compared to prior, similar appearnce. No STEMI Confirmed by Antony Blackbird [phone]) on 12/20/2021 8:55:07 AM  Radiology US Abdomen Limited RUQ (LIVER/GB)  Result Date: 12/20/2021 CLINICAL DATA:  Right upper quadrant pain EXAM: ULTRASOUND ABDOMEN LIMITED RIGHT UPPER QUADRANT COMPARISON:  CT AP 01/05/2017 FINDINGS: Gallbladder: Gallstones identified which measure up to 7 mm. No gallbladder wall thickening, pericholecystic fluid or sonographic Murphy's sign. Common bile duct: Diameter: 3 mm.  No intrahepatic bile duct dilatation. Liver: Contour of the liver appears slightly nodular. No focal lesion identified. Within normal limits in parenchymal echogenicity. Portal vein is patent on color Doppler imaging with normal direction of blood flow towards the liver. Other: None. IMPRESSION: 1. Gallstones. No secondary signs of acute cholecystitis. 2. Slight nodular contour of the liver raising the possibility of cirrhosis. Electronically Signed    By: Kerby Moors M.D.   On: 12/20/2021 12:54   CT HEAD WO CONTRAST (5MM)  Result Date: 12/20/2021 CLINICAL DATA:  Head trauma. Un witnessed fall. Patient is on Eliquis. Complains of posterior head pain. EXAM: CT HEAD WITHOUT CONTRAST CT CERVICAL SPINE WITHOUT CONTRAST TECHNIQUE: Multidetector CT imaging of the head and cervical spine was performed following the standard protocol without intravenous contrast. Multiplanar CT image reconstructions of the cervical spine were also generated. RADIATION DOSE REDUCTION: This exam was performed according to the departmental dose-optimization program which includes automated exposure control, adjustment of the mA and/or kV according to patient size and/or use of iterative reconstruction technique. COMPARISON:  Brain MRI 11/28/2021 FINDINGS: CT HEAD FINDINGS Brain: No evidence of acute infarction, hemorrhage, hydrocephalus, extra-axial collection or mass lesion/mass effect. Vascular: No hyperdense vessel or unexpected calcification. Skull: No hyperdense vessel or unexpected calcification. Sinuses/Orbits: No acute finding. Other: None CT CERVICAL SPINE FINDINGS Alignment: Detail is diminished due to motion artifact. Stepwise anterolisthesis of C4 on C5 and C5 on C6 is likely related to chronic cervical spondylosis. There are no signs of acute posttraumatic malalignment of the cervical spine. Skull base and vertebrae: No acute fracture or dislocation. Soft tissues and spinal canal: No prevertebral fluid or swelling. No visible canal hematoma. Disc levels: Multi level disc space narrowing and endplate spurring is noted throughout the cervical spine. This is most severe at the C6-7 level Upper chest: No acute findings.  Centrilobular emphysema. Other: None IMPRESSION: 1. No evidence for acute intracranial abnormality. 2. No evidence for acute cervical spine fracture or posttraumatic malalignment. 3. Multilevel degenerative disc disease and cervical spondylosis. 4.   Emphysema (ICD10-J43.9). Electronically Signed   By: Kerby Moors M.D.   On: 12/20/2021 10:08   CT Cervical Spine Wo Contrast  Result Date: 12/20/2021 CLINICAL DATA:  Head trauma. Un witnessed fall. Patient is on Eliquis.  Complains of posterior head pain. EXAM: CT HEAD WITHOUT CONTRAST CT CERVICAL SPINE WITHOUT CONTRAST TECHNIQUE: Multidetector CT imaging of the head and cervical spine was performed following the standard protocol without intravenous contrast. Multiplanar CT image reconstructions of the cervical spine were also generated. RADIATION DOSE REDUCTION: This exam was performed according to the departmental dose-optimization program which includes automated exposure control, adjustment of the mA and/or kV according to patient size and/or use of iterative reconstruction technique. COMPARISON:  Brain MRI 11/28/2021 FINDINGS: CT HEAD FINDINGS Brain: No evidence of acute infarction, hemorrhage, hydrocephalus, extra-axial collection or mass lesion/mass effect. Vascular: No hyperdense vessel or unexpected calcification. Skull: No hyperdense vessel or unexpected calcification. Sinuses/Orbits: No acute finding. Other: None CT CERVICAL SPINE FINDINGS Alignment: Detail is diminished due to motion artifact. Stepwise anterolisthesis of C4 on C5 and C5 on C6 is likely related to chronic cervical spondylosis. There are no signs of acute posttraumatic malalignment of the cervical spine. Skull base and vertebrae: No acute fracture or dislocation. Soft tissues and spinal canal: No prevertebral fluid or swelling. No visible canal hematoma. Disc levels: Multi level disc space narrowing and endplate spurring is noted throughout the cervical spine. This is most severe at the C6-7 level Upper chest: No acute findings.  Centrilobular emphysema. Other: None IMPRESSION: 1. No evidence for acute intracranial abnormality. 2. No evidence for acute cervical spine fracture or posttraumatic malalignment. 3. Multilevel  degenerative disc disease and cervical spondylosis. 4.  Emphysema (ICD10-J43.9). Electronically Signed   By: Kerby Moors M.D.   On: 12/20/2021 10:08   DG Chest Portable 1 View  Result Date: 12/20/2021 CLINICAL DATA:  81 year old female with history of hypoglycemia and fall. EXAM: PORTABLE CHEST 1 VIEW COMPARISON:  Chest x-ray 02/18/2021. FINDINGS: Lung volumes are normal. No consolidative airspace disease. No pleural effusions. No pneumothorax. No pulmonary nodule or mass noted. Pulmonary vasculature and the cardiomediastinal silhouette are within normal limits. Atherosclerosis in the thoracic aorta. Status post right shoulder arthroplasty. Status post median sternotomy for CABG including LIMA. Bony thorax appears grossly intact. IMPRESSION: 1. No radiographic evidence of significant acute traumatic injury to the thorax. 2. Aortic atherosclerosis. Electronically Signed   By: Vinnie Langton M.D.   On: 12/20/2021 09:45    Procedures Procedures    CRITICAL CARE Performed by: Gwenyth Allegra Reinaldo Helt Total critical care time: 35 minutes Critical care time was exclusive of separately billable procedures and treating other patients. Critical care was necessary to treat or prevent imminent or life-threatening deterioration. Critical care was time spent personally by me on the following activities: development of treatment plan with patient and/or surrogate as well as nursing, discussions with consultants, evaluation of patient's response to treatment, examination of patient, obtaining history from patient or surrogate, ordering and performing treatments and interventions, ordering and review of laboratory studies, ordering and review of radiographic studies, pulse oximetry and re-evaluation of patient's condition.   Medications Ordered in ED Medications  fentaNYL (SUBLIMAZE) injection 50 mcg (has no administration in time range)  dextrose 50 % solution 50 mL (50 mLs Intravenous Given by Other  12/20/21 0848)  fentaNYL (SUBLIMAZE) injection 50 mcg (50 mcg Intravenous Given 12/20/21 1057)    ED Course/ Medical Decision Making/ A&P                           Medical Decision Making Amount and/or Complexity of Data Reviewed Labs: ordered. Radiology: ordered.  Risk Prescription drug management.    [name]  is a 81 y.o. female with a past medical history significant for type 1 diabetes, hyperlipidemia, hypertension, CAD with CABG, CHF, COPD, previous falls, asthma, and patient reported Eliquis use although it is not seen the chart presents for fall and hypoglycemia.  According to EMS, patient was found down on the floor and complaining of posterior pain in her head and some neck soreness.  She also was complaining of some left-sided chest discomfort.  Patient does not member what happened and does not know how long she was on the ground.  She is not complaining of pain in her extremities but was found by EMS to have glucose in the 50s.  They gave her oral glucose and some food and drink and then on arrival here her glucose has dropped to 26.  She was given D50 and I was instructed to come see the patient quickly.  On arrival, patient is answering questions appropriately.  She is complaining of some mild discomfort in her head as well as her left chest although she thinks she may have hit it when she fell.  She does not know what happened and is denying current shortness of breath or palpitations.  She denies nausea, vomiting, constipation or diarrhea but does report she had some urinary frequency recently.  She denies any pain in her arms or legs and denies any speech difficulties or vision changes.  She think she is on Eliquis but does not member why.  On exam, lungs are clear and chest is tender to palpation on the left side.  No laceration or bruising seen.  Abdomen was nontender with normal bowel sounds.  Patient moving all extremities and has intact sensation and strength in them.   No tenderness on the back and head and there was no hematoma or laceration.  Neck was nontender initially.  As she says she is on Eliquis and fell and hit her head and does not member what happened, we will activate a level 2 trauma to help expedite her care.  Chart review shows that patient recently had her insulin increased from 3 times daily to a higher dose and twice daily to space it out better clinical aspect patient may have had this contribute to the hypoglycemic episode overnight and this morning.  She reports she has not taken any insulin yet today and only ate with EMS.  She will get imaging of the head and neck due to the possible blood thinner use and fall with headache.  We will also get work-up to look for occult infection.  Given her reported urinary frequency will check urinalysis with culture and will also get chest x-ray due to the chest discomfort.  She denies any fevers, chills, or cough and we will check other electrolytes as well.  Anticipate reassessment and monitoring for glucose determine if patient will need admission or be safe for discharge home.  Patient was found to be hypothermic and so a bear hugger was ordered.  12:00 PM Patient reassessed again and family has arrived.  They said she is at her baseline and last saw her go to bed about 10 PM.  They said that she was up this morning when they heard the fall.  Do not suspect prolonged downtime now.  Patient complaining of more epigastric pain than she was on arrival.  She is tender so we will get right upper quadrant ultrasound to rule out a gallbladder etiology.  Her labs have begun to return and her labs have returned reassuring  thus far.  BNP is improved from prior.  TSH normal.  CBC and CMP do not show any critical abnormalities initially.  Lipase normal.  Initial troponin negative, will trend.  Due to the cramping will get the CK and magnesium level.  She is still waiting on urinalysis and the ultrasound.  Anticipate  shared decision making conversation after work-up is completed to determine disposition.  3:46 PM Patient's ultrasound shows gallstones but no evidence of acute cholecystitis.  She is still having cramping in the legs although the rest of her labs were overall reassuring.  Her glucose has not decreased precipitously for several hours so I suspect this is related to her medications and not occult infection.  Urinalysis does not show UTI.  Patient reassessed and is still having severe abdominal pain that is 9 out of 10 in severity.  Due to the worsening abdominal pain despite a reassuring ultrasound, will order CT scan anticipate she will need admission for both the concerning abdominal pain as well as getting her glucose management stabilized.  Care transferred to oncoming team to await results of CT imaging and admission.        Final Clinical Impression(s) / ED Diagnoses Final diagnoses:  Hypoglycemia  Fall, initial encounter  Continuous severe abdominal pain    Clinical Impression: 1. Hypoglycemia   2. Fall, initial encounter   3. Continuous severe abdominal pain     Disposition: Admit  This note was prepared with assistance of Dragon voice recognition software. Occasional wrong-word or sound-a-like substitutions may have occurred due to the inherent limitations of voice recognition software.      Alayziah Tangeman, Gwenyth Allegra, MD 12/20/21 [id number]

## 2021-12-20 NOTE — ED Notes (Signed)
Trauma Response Nurse Documentation  [name] is a 81 y.o. female arriving to [facility] ED via EMS  On Eliquis (apixaban) daily per patient, however I don't see this in her medication history listed on file. Trauma was activated as a Level 2 by Dr Sherry Ruffing after arrival based on the following trauma criteria Elderly patients > 65 with head trauma on anti-coagulation (excluding ASA). Trauma team at the bedside on patient arrival.   Patient cleared for CT by Dr. Sherry Ruffing. Pt transported to CT with trauma response nurse present to monitor. RN remained with the patient throughout their absence from the department for clinical observation.   GCS 15 on arrival. Multiple low blood sugars and patient reports multiple low blood sugars over the past couple of days (as low as 50s and she checks 4 times a day). She does take insulin at home and states her dose was changed on Friday but she doesn't remember the exact dose. No other changes in eating or how she has been feeling.  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   Hyperlipidemia    Hypertension    PONV (postoperative nausea and vomiting)    Schatzki's ring    Shoulder injury    resolved after shoulder surgery   Sleep apnea    not on cpap     Past Surgical History:  Procedure Laterality Date   ABDOMINAL HYSTERECTOMY     APPENDECTOMY     came out with Hysterectomy   CARDIAC  CATHETERIZATION  07/23/2009   EF 60%   CARDIAC CATHETERIZATION  10/11/2008   CARDIAC CATHETERIZATION  03/01/2007   EF 75-80%   CARDIOVASCULAR STRESS TEST  11/15/2007   EF 60%   COLONOSCOPY     CORONARY ARTERY BYPASS GRAFT     SEVERELY DISEASED SAPHENOUS VEIN GRAFT TO THE RIGHT CORONARY ARTERY BUT WITH FAIRLY WELL PRESERVED FLOW TO THE DISTAL RIGHT CORONARY ARTERY FROM THE NATIVE CIRCULATION-RESTART  CATH IN JUNE 2000, REVEALS MILD/MODERATE  CAD WITH GOOD FLOW DOWN HER LAD   ESOPHAGOGASTRODUODENOSCOPY     EYE SURGERY     bilateral cataract surgery with lens implant   LEFT HEART CATH AND CORS/GRAFTS ANGIOGRAPHY N/A 12/14/2016   Procedure: LEFT HEART CATH AND CORS/GRAFTS ANGIOGRAPHY;  Surgeon: Jettie Booze, MD;  Location: [facility];  Service: Cardiovascular;  Laterality: N/A;   lense removal Left    POLYPECTOMY     RIGHT/LEFT HEART CATH AND CORONARY/GRAFT ANGIOGRAPHY N/A 07/28/2021   Procedure: RIGHT/LEFT HEART CATH AND CORONARY/GRAFT ANGIOGRAPHY;  Surgeon: Troy Sine, MD;  Location: [facility];  Service: Cardiovascular;  Laterality: N/A;   ROTATOR CUFF REPAIR     right and left   TONSILLECTOMY  age 39   TOTAL KNEE ARTHROPLASTY Right 02/20/2014   Procedure: RIGHT TOTAL KNEE ARTHROPLASTY;  Surgeon: Tobi Bastos, MD;  Location: [facility];  Service: Orthopedics;  Laterality: Right;   tumor removed kidney     UPPER GASTROINTESTINAL ENDOSCOPY     US ECHOCARDIOGRAPHY  03/08/2008   EF 55-60%     Initial Focused Assessment (If applicable, or please see trauma documentation): GCS 15, A&Ox4 but unable to recall what lead to her fall No obvious trauma but does have pain in her head, unsure if she hit it during the fall  CT's Completed:   CT Head and CT C-Spine   Interventions:  IV, labs CXR CT Head/Cspine Dextrose for hypoglycemia and bair hugger for hypothermia  Event Summary: Patient to ED via EMS after reported fall and hypoglycemia. Patient takes insulin,  believes her dose was changed on Friday and she has been getting low readings into the 50s. She checks her blood sugar 4x per day. No changes to eating habits. Imaging is negative for any traumatic injury, will most likely continue with full medical workup due to hypothermia/hypoglycemia.  Bedside handoff with ED EMT Vicente Males.    Park Pope Abi Shoults  Trauma Response RN  Please call TRN at [phone] for further assistance.

## 2021-12-20 NOTE — Assessment & Plan Note (Signed)
Unclear why patient is on chronic anticoagulation.  Would benefit from further cardiology evaluation current appears to be in sinus rhythm no records of indicating the patient have any A-fib but may need further investigation.  Patient states she has been on anticoagulation ever since her CABG

## 2021-12-20 NOTE — ED Notes (Signed)
Pt states pain is better.  Feels more comfortable

## 2021-12-20 NOTE — ED Notes (Signed)
Patient given a sandwich bag and some water at this time.

## 2021-12-20 NOTE — ED Triage Notes (Signed)
Pt arrived to ED via EMS form home w/ c/o unwitnessed fall, pt cannot remember how she fell or that she fell. Pt is on eliquis and is complaining of posterior head pain. Pt was found supine on the floor. EMS checked pt CBG and it was 59. EMS fed pt and gave her something to drink. CBG was then 55. EMS gave oral glucose. 20g LAC, pt SB w/ RBBB.

## 2021-12-20 NOTE — Assessment & Plan Note (Signed)
Continue Protonix °

## 2021-12-20 NOTE — Assessment & Plan Note (Addendum)
In the setting of hypoglycemia blood sugar as well as 26.  Continue to monitor Denies any chest pain shortness of breath Recent echogram on Monday 1 month ago Troponin unremarkable Patient does not endorse any chest pain has been having abdominal pain which appears to be musculoskeletal by exam

## 2021-12-20 NOTE — Subjective & Objective (Signed)
Recently seen endocrinology nad increased her humalog  Had a syncopal event and hit her head  Presented with BG in 20's and hypothermic  Started on D10 drip

## 2021-12-20 NOTE — Assessment & Plan Note (Signed)
Allow permissive hypertension for now resume home medications as able to tolerate

## 2021-12-20 NOTE — Progress Notes (Signed)
Orthopedic Tech Progress Note Patient Details:  [name] 01/21/1941 [id number]  Level 2 trauma, ortho tech not needed at this time.  Patient ID: [name], female   DOB: Aug 10, 1940, 81 y.o.   MRN: [id number]  [name] 12/20/2021, 12:44 PM

## 2021-12-20 NOTE — Assessment & Plan Note (Signed)
Ultrasound of the liver suggestive of possible cirrhosis obtain hepatitis serologies continue to follow may need further follow-up with GI current abdominal pain more consistent musculoskeletal

## 2021-12-20 NOTE — Assessment & Plan Note (Signed)
Given significant hypoglycemia will have to hold off on insulin for right now until able to control blood sugars.  Order diabetic coordinator consult once blood sugar stabilizes will likely need to resume insulin but at a much lower doses

## 2021-12-20 NOTE — Assessment & Plan Note (Signed)
Gently rehydrate current appears to be slightly on the dry side

## 2021-12-20 NOTE — Assessment & Plan Note (Signed)
Chronic-stable.

## 2021-12-21 DIAGNOSIS — I5032 Chronic diastolic (congestive) heart failure: Secondary | ICD-10-CM

## 2021-12-21 DIAGNOSIS — Z7901 Long term (current) use of anticoagulants: Secondary | ICD-10-CM | POA: Diagnosis not present

## 2021-12-21 DIAGNOSIS — E162 Hypoglycemia, unspecified: Secondary | ICD-10-CM | POA: Diagnosis not present

## 2021-12-21 DIAGNOSIS — R55 Syncope and collapse: Secondary | ICD-10-CM

## 2021-12-21 DIAGNOSIS — I25709 Atherosclerosis of coronary artery bypass graft(s), unspecified, with unspecified angina pectoris: Secondary | ICD-10-CM | POA: Diagnosis not present

## 2021-12-21 DIAGNOSIS — J4541 Moderate persistent asthma with (acute) exacerbation: Secondary | ICD-10-CM

## 2021-12-21 LAB — CBG MONITORING, ED: Glucose-Capillary: 224 mg/dL — ABNORMAL HIGH (ref 70–99)

## 2021-12-21 LAB — HEPATITIS PANEL, ACUTE
HCV Ab: NONREACTIVE
Hep A IgM: NONREACTIVE
Hep B C IgM: NONREACTIVE
Hepatitis B Surface Ag: NONREACTIVE

## 2021-12-21 MED ORDER — ASPIRIN 81 MG PO TBEC: 81.0000 mg | DELAYED_RELEASE_TABLET | Freq: Every day | ORAL | Status: DC

## 2021-12-21 MED ORDER — PANTOPRAZOLE SODIUM 40 MG PO TBEC: 40.0000 mg | DELAYED_RELEASE_TABLET | Freq: Every day | ORAL | 0 refills | Status: DC

## 2021-12-21 MED ORDER — PANTOPRAZOLE SODIUM 40 MG IV SOLR
40.0000 mg | Freq: Once | INTRAVENOUS | Status: AC
  Administered 2021-12-21: 40 mg via INTRAVENOUS
  Filled 2021-12-21: qty 10

## 2021-12-21 MED ORDER — FENTANYL CITRATE PF 50 MCG/ML IJ SOSY
50.0000 ug | PREFILLED_SYRINGE | Freq: Once | INTRAMUSCULAR | Status: AC
  Administered 2021-12-21: 50 ug via INTRAVENOUS
  Filled 2021-12-21: qty 1

## 2021-12-21 MED ORDER — METOPROLOL SUCCINATE ER 25 MG PO TB24: 100.0000 mg | ORAL_TABLET | Freq: Every day | ORAL | Status: DC

## 2021-12-21 MED ORDER — ACETAMINOPHEN 650 MG RE SUPP: 650.0000 mg | Freq: Four times a day (QID) | RECTAL | Status: DC | PRN

## 2021-12-21 MED ORDER — TOPIRAMATE 25 MG PO TABS: 25.0000 mg | ORAL_TABLET | Freq: Every day | ORAL | Status: DC

## 2021-12-21 MED ORDER — DEXTROSE 10 % IV SOLN: INTRAVENOUS | Status: DC

## 2021-12-21 MED ORDER — INSULIN DETEMIR 100 UNIT/ML ~~LOC~~ SOLN
10.0000 [IU] | Freq: Once | SUBCUTANEOUS | Status: AC
  Administered 2021-12-21: 10 [IU] via SUBCUTANEOUS
  Filled 2021-12-21: qty 0.1

## 2021-12-21 MED ORDER — ALBUTEROL SULFATE HFA 108 (90 BASE) MCG/ACT IN AERS: 2.0000 | INHALATION_SPRAY | Freq: Four times a day (QID) | RESPIRATORY_TRACT | Status: DC | PRN

## 2021-12-21 MED ORDER — ALBUTEROL SULFATE (2.5 MG/3ML) 0.083% IN NEBU: 2.5000 mg | INHALATION_SOLUTION | Freq: Four times a day (QID) | RESPIRATORY_TRACT | Status: DC | PRN

## 2021-12-21 MED ORDER — HYDROCODONE-ACETAMINOPHEN 5-325 MG PO TABS
1.0000 | ORAL_TABLET | ORAL | Status: DC | PRN
  Administered 2021-12-21: 1 via ORAL
  Filled 2021-12-21: qty 1

## 2021-12-21 MED ORDER — ACETAMINOPHEN 325 MG PO TABS: 650.0000 mg | ORAL_TABLET | Freq: Four times a day (QID) | ORAL | Status: DC | PRN

## 2021-12-21 NOTE — Discharge Summary (Signed)
Physician Discharge Summary   Patient: [name] MRN: [id number] DOB: 1940/09/12  Admit date:     12/20/2021  Discharge date: 12/21/21  Discharge Physician: Patrecia Pour   PCP: Binnie Rail, MD   Recommendations at discharge:  Follow up with PCP and/or endocrinology. Patient admitted with hypoglycemia-induced syncopal episode after confusing insulin dosing. Symptoms and hypoglycemia resolved, discharged with plans to restart humalog 75/25 at 24u BIDWC dosing (pt mistakenly took 75u) per endocrinology note. Consider GI evaluation for slightly nodular liver morphology on U/S and if abdominal cramping persists. Restarting PPI after reassuring CT and exam, tolerating po intake without issues.   Discharge Diagnoses: Principal Problem:   Hypoglycemia Active Problems:   Hyperlipidemia   Extrinsic asthma   GERD   CAD (coronary artery disease)   Essential hypertension   Chronic diastolic CHF (congestive heart failure) (HCC)   OSA (obstructive sleep apnea) with hypoxia   Type 1 diabetes mellitus (HCC)   Chronic anticoagulation   Syncope   Cirrhosis [facility])   Hospital Course: [name] is an 81 y.o. female with a history of T1DM, CAD s/p CABG, COPD, HTN, HLD, OSA who presented to the ED after a fall at home. CT head and cervical spine unremarkable. She was hypothermic and hypoglycemic on arrival, improved with dextrose push then infusion. Due to abdominal cramping, abd/pelvis CT was performed and without acute changes. She is tolerating a diet with improving blood sugars.   It has come to light that the patient was under the mistaken impression that she was told to take 75 units twice daily instead of 24 units twice daily of her humalog mix. Education has been given by RN, diabetes coordinator, and hospitalist. the patient has firm understanding of recommendation and hypoglycemic precautions.   She did well with PT, home health will be arranged at discharge.   Assessment and  Plan: T1DM uncontrolled with hypoglycemia: Unintentionally overdosed herself on insulin due to mistaken dose recommendation by endocrinology.  - Advised to take humalog 75/25 at a dose of 24 units with breakfast and 24 units with dinner. Told not take add a lunchtime dose as she previously was doing to avoid stacking. Also confirmed and reinforced that she is not to take 75 units at any time. - Home health RN ordered in addition to PT to assist with medication management in the home environment.   Hypothermia: Due to profound hypoglycemia, resolved.  Abdominal cramping: Pt previously on PPI. Exam is benign, CT abd/pelvis without acute changes.  - Restart PPI, consider GI evaluation if persistent.   RUQ abdominal tenderness: Appears musculoskeletal per admitting MD. Seems to have resolved at this time. U/S noted slight nodularity of the liver with normal echogenicity. No cholestatic appearance, negative Murphy's sign. LFTs are normal. No acute work up indicated.  - Acute hepatitis panel is pending at the time of discharge.   Syncope: Due to hypoglycemia. No syncopal symptoms or orthostasis with PT. CK normal, troponin normal.   OSA: No longer on CPAP. Was noted to have transient desaturations consistent with sleep apnea while in the ED sleeping.  - Recommend reinitiation of CPAP which may or may not require repeat sleep study.   Other chronic medical conditions appeared stable during period of observation. No changes to their management are recommended.    Consultants: Diabetes coordinator Procedures performed: None  Disposition: Home Diet recommendation: Carb-modified, heart healthy  DISCHARGE MEDICATION: Allergies as of 12/21/2021       Reactions   Amlodipine  Other (See Comments)   hallucinations    Banana Nausea And Vomiting   Stomach pumped   Co Q10 [coenzyme Q10] Other (See Comments)   Body cramps   Codeine Other (See Comments)   Hallucinate, loose identity and don't know who  I am   Insulin Aspart Other (See Comments)   Unknown   Lisinopril Other (See Comments)   nose bleed   Metformin And Related Nausea And Vomiting   Morphine Other (See Comments)   Can not function, it immobilizes me    Pentazocine Nausea And Vomiting   Pravastatin Other (See Comments)   Hands locked up   Repatha [evolocumab] Other (See Comments)   myalgias   Statins Other (See Comments)   Muscle cramps   Sulfa Antibiotics Swelling   Sulfonamide Derivatives Swelling   Tramadol Hcl Other (See Comments)   Dizziness, cant function         Medication List     STOP taking these medications    amoxicillin-clavulanate 875-125 MG tablet Commonly known as: AUGMENTIN   nortriptyline 50 MG capsule Commonly known as: PAMELOR   predniSONE 20 MG tablet Commonly known as: DELTASONE       TAKE these medications    acetaminophen 325 MG tablet Commonly known as: TYLENOL Take 650 mg by mouth every 6 (six) hours as needed for moderate pain or headache.   albuterol 108 (90 Base) MCG/ACT inhaler Commonly known as: VENTOLIN HFA Inhale 2 puffs into the lungs every 6 (six) hours as needed. For shortness of breath. What changed: Another medication with the same name was changed. Make sure you understand how and when to take each.   albuterol (2.5 MG/3ML) 0.083% nebulizer solution Commonly known as: PROVENTIL USE 1 VIAL IN NEBULIZER 4 TIMES DAILY What changed: See the new instructions.   albuterol (2.5 MG/3ML) 0.083% nebulizer solution Commonly known as: PROVENTIL Take 3 mLs (2.5 mg total) by nebulization every 6 (six) hours as needed for wheezing or shortness of breath. What changed: Another medication with the same name was changed. Make sure you understand how and when to take each.   aspirin EC 81 MG tablet Take 1 tablet (81 mg total) by mouth daily.   baclofen 20 MG tablet Commonly known as: LIORESAL TAKE 2 TABLETS BY MOUTH 2 TIMES DAILY.   calcium carbonate 1250 (500 Ca)  MG chewable tablet Commonly known as: OS-CAL Chew 500 mg by mouth daily as needed for heartburn.   diltiazem 240 MG 24 hr capsule Commonly known as: CARDIZEM CD Take 1 capsule (240 mg total) by mouth daily.   FreeStyle Libre 2 Reader Devi 1 Device by Does not apply route every 14 (fourteen) days.   FreeStyle Libre 2 Sensor Misc 1 Device by Does not apply route every 14 (fourteen) days.   furosemide 40 MG tablet Commonly known as: LASIX Take 1 tablet (40 mg total) by mouth daily.   glucose blood test strip Use as instructed to test blood sugars up to 4 times daily   insulin lispro protamine-lispro (75-25) 100 UNIT/ML Susp injection Commonly known as: HUMALOG 75/25 MIX Inject 24 Units into the skin 2 (two) times daily with a meal. 10 units at lunch depending on bs reading What changed:  how much to take additional instructions   INSULIN SYRINGE .3CC/29GX1" 29G X 1" 0.3 ML Misc 1 Device by Does not apply route in the morning and at bedtime.   isosorbide mononitrate 60 MG 24 hr tablet Commonly known as: IMDUR Take  one tablet ( 60 mg ) twice daily 8 hours apart. What changed:  how much to take when to take this   meclizine 25 MG tablet Commonly known as: ANTIVERT Take 25 mg by mouth 2 (two) times daily as needed for dizziness.   metoprolol succinate 100 MG 24 hr tablet Commonly known as: TOPROL-XL TAKE 1 TABLET BY MOUTH EVERY DAY What changed: when to take this   multivitamin with minerals Tabs tablet Take 1 tablet by mouth daily.   Nexlizet 180-10 MG Tabs Generic drug: Bempedoic Acid-Ezetimibe Take 1 tablet by mouth daily.   nitroGLYCERIN 0.4 MG SL tablet Commonly known as: NITROSTAT Place 1 tablet (0.4 mg total) under the tongue every 5 (five) minutes as needed for chest pain.   ORION 4 inclisiran or placebo 300 mg/1.5 mL SQ injection Inject 300 mg into the skin every 6 (six) months.   pantoprazole 40 MG tablet Commonly known as: Protonix Take 1 tablet (40  mg total) by mouth daily.   spironolactone 25 MG tablet Commonly known as: ALDACTONE TAKE 1 TABLET BY MOUTH EVERY DAY What changed: when to take this   temazepam 30 MG capsule Commonly known as: RESTORIL Take 30 mg by mouth daily.   topiramate 25 MG tablet Commonly known as: TOPAMAX Take 1 tablet (25 mg total) by mouth at bedtime.   Trelegy Ellipta 200-62.5-25 MCG/ACT Aepb Generic drug: Fluticasone-Umeclidin-Vilant Inhale 1 puff into the lungs daily at 6 (six) AM.   VITAMIN B-12 IJ Inject 1,000 mcg as directed every 30 (thirty) days.   Vitamin D 50 MCG (2000 UT) tablet Take 4,000 Units by mouth daily.        Follow-up Information     Binnie Rail, MD Follow up.   Specialty: Internal Medicine Contact information: [place] [address] [phone]         Shamleffer, Melanie Crazier, MD. Schedule an appointment as soon as possible for a visit.   Specialties: Endocrinology, Radiology Contact information: [place] [address] [phone]                Discharge Exam: Danley Danker Weights   12/20/21 0855  Weight: 78 kg  BP (!) 147/88   Pulse 80   Temp 98.8 F (37.1 C) (Oral)   Resp (!) 21   Ht '5\' 2"'$  (1.575 m)   Wt 78 kg   LMP  (LMP Unknown)   SpO2 93%   BMI 31.46 kg/m   Well-appearing elderly female in no distress Clear, nonlabored RRR, no edema Abdomen is soft, nondistended, without focal tenderness, +BS No focal deficits, alert and oriented  Condition at discharge: stable  The results of significant diagnostics from this hospitalization (including imaging, microbiology, ancillary and laboratory) are listed below for reference.   Imaging Studies: CT ABDOMEN PELVIS W CONTRAST  Result Date: 12/20/2021 CLINICAL DATA:  Abdominal pain, acute, nonlocalized EXAM: CT ABDOMEN AND PELVIS WITH CONTRAST TECHNIQUE: Multidetector CT imaging of the abdomen and pelvis was performed using the standard protocol  following bolus administration of intravenous contrast. RADIATION DOSE REDUCTION: This exam was performed according to the departmental dose-optimization program which includes automated exposure control, adjustment of the mA and/or kV according to patient size and/or use of iterative reconstruction technique. CONTRAST:  66m OMNIPAQUE IOHEXOL 350 MG/ML SOLN COMPARISON:  CT abdomen pelvis 01/04/2017 FINDINGS: Lower chest: Severe mitral annular calcifications. Coronary artery calcifications. Hepatobiliary: No focal liver abnormality. No gallstones, gallbladder wall thickening, or pericholecystic fluid.  No biliary dilatation. Pancreas: No focal lesion. Normal pancreatic contour. No surrounding inflammatory changes. No main pancreatic ductal dilatation. Spleen: Normal in size. Stable nonspecific pericentimeter hypodensity. Adrenals/Urinary Tract: No adrenal nodule bilaterally. Bilateral kidneys enhance symmetrically. Fluid density lesions within the bilateral kidneys likely represent simple renal cysts. The left lesion has increased in size measuring up to 5.3 cm (from 4 cm in 2018). Simple renal cysts, in the absence of clinically indicated signs/symptoms, require no independent follow-up. No hydronephrosis. No hydroureter. The urinary bladder is unremarkable. On delayed imaging, there is no urothelial wall thickening and there are no filling defects in the opacified portions of the bilateral collecting systems or ureters. Stomach/Bowel: Stomach is within normal limits. Redemonstration of a fat density along the gastric pylorus/antrum likely represents a benign lipomatous mural lesion. No evidence of bowel wall thickening or dilatation. Colonic diverticulosis. The appendix is not definitely identified with no inflammatory changes in the right lower quadrant to suggest acute appendicitis. Vascular/Lymphatic: No abdominal aorta or iliac aneurysm. Severe atherosclerotic plaque of the aorta and its branches. No abdominal,  pelvic, or inguinal lymphadenopathy. Reproductive: Status post hysterectomy. No adnexal masses. Other: No intraperitoneal free fluid. No intraperitoneal free gas. No organized fluid collection. Musculoskeletal: No abdominal wall hernia or abnormality. No suspicious lytic or blastic osseous lesions. No acute displaced fracture. Grade 1 anterolisthesis of L4 on L5. IMPRESSION: 1. Colonic diverticulosis with no acute diverticulitis. 2. Aortic Atherosclerosis (ICD10-I70.0) including coronary calcification severe mitral annular calcification. Electronically Signed   By: Iven Finn M.D.   On: 12/20/2021 18:13   US Abdomen Limited RUQ (LIVER/GB)  Result Date: 12/20/2021 CLINICAL DATA:  Right upper quadrant pain EXAM: ULTRASOUND ABDOMEN LIMITED RIGHT UPPER QUADRANT COMPARISON:  CT AP 01/05/2017 FINDINGS: Gallbladder: Gallstones identified which measure up to 7 mm. No gallbladder wall thickening, pericholecystic fluid or sonographic Murphy's sign. Common bile duct: Diameter: 3 mm.  No intrahepatic bile duct dilatation. Liver: Contour of the liver appears slightly nodular. No focal lesion identified. Within normal limits in parenchymal echogenicity. Portal vein is patent on color Doppler imaging with normal direction of blood flow towards the liver. Other: None. IMPRESSION: 1. Gallstones. No secondary signs of acute cholecystitis. 2. Slight nodular contour of the liver raising the possibility of cirrhosis. Electronically Signed   By: Kerby Moors M.D.   On: 12/20/2021 12:54   CT HEAD WO CONTRAST (5MM)  Result Date: 12/20/2021 CLINICAL DATA:  Head trauma. Un witnessed fall. Patient is on Eliquis. Complains of posterior head pain. EXAM: CT HEAD WITHOUT CONTRAST CT CERVICAL SPINE WITHOUT CONTRAST TECHNIQUE: Multidetector CT imaging of the head and cervical spine was performed following the standard protocol without intravenous contrast. Multiplanar CT image reconstructions of the cervical spine were also  generated. RADIATION DOSE REDUCTION: This exam was performed according to the departmental dose-optimization program which includes automated exposure control, adjustment of the mA and/or kV according to patient size and/or use of iterative reconstruction technique. COMPARISON:  Brain MRI 11/28/2021 FINDINGS: CT HEAD FINDINGS Brain: No evidence of acute infarction, hemorrhage, hydrocephalus, extra-axial collection or mass lesion/mass effect. Vascular: No hyperdense vessel or unexpected calcification. Skull: No hyperdense vessel or unexpected calcification. Sinuses/Orbits: No acute finding. Other: None CT CERVICAL SPINE FINDINGS Alignment: Detail is diminished due to motion artifact. Stepwise anterolisthesis of C4 on C5 and C5 on C6 is likely related to chronic cervical spondylosis. There are no signs of acute posttraumatic malalignment of the cervical spine. Skull base and vertebrae: No acute fracture or dislocation. Soft tissues and  spinal canal: No prevertebral fluid or swelling. No visible canal hematoma. Disc levels: Multi level disc space narrowing and endplate spurring is noted throughout the cervical spine. This is most severe at the C6-7 level Upper chest: No acute findings.  Centrilobular emphysema. Other: None IMPRESSION: 1. No evidence for acute intracranial abnormality. 2. No evidence for acute cervical spine fracture or posttraumatic malalignment. 3. Multilevel degenerative disc disease and cervical spondylosis. 4.  Emphysema (ICD10-J43.9). Electronically Signed   By: Kerby Moors M.D.   On: 12/20/2021 10:08   CT Cervical Spine Wo Contrast  Result Date: 12/20/2021 CLINICAL DATA:  Head trauma. Un witnessed fall. Patient is on Eliquis. Complains of posterior head pain. EXAM: CT HEAD WITHOUT CONTRAST CT CERVICAL SPINE WITHOUT CONTRAST TECHNIQUE: Multidetector CT imaging of the head and cervical spine was performed following the standard protocol without intravenous contrast. Multiplanar CT image  reconstructions of the cervical spine were also generated. RADIATION DOSE REDUCTION: This exam was performed according to the departmental dose-optimization program which includes automated exposure control, adjustment of the mA and/or kV according to patient size and/or use of iterative reconstruction technique. COMPARISON:  Brain MRI 11/28/2021 FINDINGS: CT HEAD FINDINGS Brain: No evidence of acute infarction, hemorrhage, hydrocephalus, extra-axial collection or mass lesion/mass effect. Vascular: No hyperdense vessel or unexpected calcification. Skull: No hyperdense vessel or unexpected calcification. Sinuses/Orbits: No acute finding. Other: None CT CERVICAL SPINE FINDINGS Alignment: Detail is diminished due to motion artifact. Stepwise anterolisthesis of C4 on C5 and C5 on C6 is likely related to chronic cervical spondylosis. There are no signs of acute posttraumatic malalignment of the cervical spine. Skull base and vertebrae: No acute fracture or dislocation. Soft tissues and spinal canal: No prevertebral fluid or swelling. No visible canal hematoma. Disc levels: Multi level disc space narrowing and endplate spurring is noted throughout the cervical spine. This is most severe at the C6-7 level Upper chest: No acute findings.  Centrilobular emphysema. Other: None IMPRESSION: 1. No evidence for acute intracranial abnormality. 2. No evidence for acute cervical spine fracture or posttraumatic malalignment. 3. Multilevel degenerative disc disease and cervical spondylosis. 4.  Emphysema (ICD10-J43.9). Electronically Signed   By: Kerby Moors M.D.   On: 12/20/2021 10:08   DG Chest Portable 1 View  Result Date: 12/20/2021 CLINICAL DATA:  81 year old female with history of hypoglycemia and fall. EXAM: PORTABLE CHEST 1 VIEW COMPARISON:  Chest x-ray 02/18/2021. FINDINGS: Lung volumes are normal. No consolidative airspace disease. No pleural effusions. No pneumothorax. No pulmonary nodule or mass noted. Pulmonary  vasculature and the cardiomediastinal silhouette are within normal limits. Atherosclerosis in the thoracic aorta. Status post right shoulder arthroplasty. Status post median sternotomy for CABG including LIMA. Bony thorax appears grossly intact. IMPRESSION: 1. No radiographic evidence of significant acute traumatic injury to the thorax. 2. Aortic atherosclerosis. Electronically Signed   By: Vinnie Langton M.D.   On: 12/20/2021 09:45   DG Chest 2 View  Result Date: 12/09/2021 CLINICAL DATA:  Provided history: Chest pain. Additional history provided: Productive cough. History of asthma. History of heart surgery 6 years ago. EXAM: CHEST - 2 VIEW COMPARISON:  Prior chest radiographs 11/09/2021 and earlier. FINDINGS: Prior median sternotomy/CABG. Heart size within normal limits. Mitral annular calcifications. Aortic atherosclerosis. No appreciable airspace consolidation. No evidence of pleural effusion or pneumothorax. No acute bony abnormality identified. Degenerative changes of the spine. Prior reverse right shoulder arthroplasty. IMPRESSION: No evidence of acute cardiopulmonary abnormality. Mitral annular calcifications. Aortic Atherosclerosis (ICD10-I70.0). Electronically Signed   By: Marylyn Ishihara  Armandina Gemma D.O.   On: 12/09/2021 13:15   MR Brain W Wo Contrast  Result Date: 12/01/2021 CLINICAL DATA:  81 year old female with headaches, unsteady gait, difficulty walking and weakness in the left leg for 1 year. EXAM: MRI HEAD WITHOUT AND WITH CONTRAST TECHNIQUE: Multiplanar, multiecho pulse sequences of the brain and surrounding structures were obtained without and with intravenous contrast. CONTRAST:  7.5 mL Vueway COMPARISON:  Brain MRI 08/24/2020, and earlier. FINDINGS: Brain: Cerebral volume is within normal limits for age. No restricted diffusion to suggest acute infarction. No midline shift, mass effect, evidence of mass lesion, ventriculomegaly, extra-axial collection or acute intracranial hemorrhage.  Cervicomedullary junction and pituitary are within normal limits. Pearline Cables and white matter signal is within normal limits for age throughout the brain. No convincing cortical encephalomalacia or chronic cerebral blood products. Deep gray nuclei, brainstem and cerebellum are within normal limits for age. No abnormal enhancement identified.  No dural thickening. Vascular: Major intracranial vascular flow voids are stable from a 2013 MRI. The distal right vertebral artery appears mildly dominant. The major dural venous sinuses are enhancing and appear to be patent. Skull and upper cervical spine: Normal for age visible upper cervical spine. Visualized bone marrow signal is within normal limits. Sinuses/Orbits: Chronic postoperative changes to the globes. Chronic susceptibility artifact along the anterior left maxillary sinus wall related to remote ORIF. Orbits otherwise appear stable and negative. Paranasal sinuses and mastoids are stable and well aerated. Other: Visible internal auditory structures appear normal. Stylomastoid foramina appear normal. Negative visible scalp and face. IMPRESSION: No acute intracranial abnormality and normal for age MRI appearance of the Brain. Electronically Signed   By: Genevie Ann M.D.   On: 12/01/2021 04:37    Microbiology: Results for orders placed or performed during the hospital encounter of 12/20/21  Urine Culture     Status: Abnormal (Preliminary result)   Collection Time: 12/20/21  3:39 PM   Specimen: Urine, Clean Catch  Result Value Ref Range Status   Specimen Description URINE, CLEAN CATCH  Final   Special Requests NONE  Final   Culture (A)  Final    20,000 COLONIES/mL GRAM NEGATIVE RODS IDENTIFICATION AND SUSCEPTIBILITIES TO FOLLOW Performed at [facility], [place] [address], [place], [place] [address]    Report Status PENDING  Incomplete    Labs: CBC: Recent Labs  Lab 12/20/21 0910  WBC 7.2  NEUTROABS 4.7  HGB 12.6  HCT 40.3  MCV 89.2  PLT 017    Basic Metabolic Panel: Recent Labs  Lab 12/20/21 0910 12/20/21 1047  NA 139  --   K 4.3  --   CL 105  --   CO2 26  --   GLUCOSE 211*  --   BUN 20  --   CREATININE 0.80  --   CALCIUM 9.6  --   MG  --  2.2   Liver Function Tests: Recent Labs  Lab 12/20/21 0910  AST 25  ALT 16  ALKPHOS 71  BILITOT 0.3  PROT 6.7  ALBUMIN 3.4*   CBG: Recent Labs  Lab 12/20/21 1401 12/20/21 1820 12/20/21 2026 12/20/21 2319 12/21/21 0648  GLUCAP 182* 76 120* 188* 224*    Discharge time spent: greater than 30 minutes.  Signed: Patrecia Pour, MD [facility] 12/21/2021

## 2021-12-21 NOTE — Progress Notes (Signed)
OT Cancellation Note  Patient Details Name: [name] MRN: [id number] DOB: [birth year]   Cancelled Treatment:    Reason Eval/Treat Not Completed: OT screened, no needs identified, will sign off Per PT and nursing, pt moving well, syncope and hypoglycemia resolved and has assistance at home as needed. No formal OT eval needed at this time.   Layla Maw 12/21/2021, 12:16 PM

## 2021-12-21 NOTE — Progress Notes (Signed)
Physical Therapy Evaluation Patient Details Name: [name] MRN: [id number] DOB: 1940/08/20 Today's Date: 12/21/2021  History of Present Illness  81 yo female was evaluated for PT after admission on 11/12, with a fall from a syncopal episode at home.  Pt found to be hypoglycemic, and was notably not orthostatic during PT session.  PMHx:  CAD CABG, DM1, HLD, HTN, COPD, OSA  Clinical Impression  Pt was seen for check of orthostatics which were negative, at supine 158/73, sitting 144/101, standing 125/84 with sats all 94-95% and pulses 89, 94 and 96 respectively.  No dizziness or light headed feelings, and per pt was not feeling off with gait.  Pt is obviously mildly weak, and should be followed up with HHPT and has access to equipment from her TKA if an AD becomes necessary.  Follow acutely as her stay permits for strengthening and gait recovery.      Recommendations for follow up therapy are one component of a multi-disciplinary discharge planning process, led by the attending physician.  Recommendations may be updated based on patient status, additional functional criteria and insurance authorization.  Follow Up Recommendations Home health PT      Assistance Recommended at Discharge Intermittent Supervision/Assistance  Patient can return home with the following  A little help with bathing/dressing/bathroom;Assistance with cooking/housework;Assist for transportation;Help with stairs or ramp for entrance    Equipment Recommendations None recommended by PT  Recommendations for Other Services       Functional Status Assessment Patient has had a recent decline in their functional status and demonstrates the ability to make significant improvements in function in a reasonable and predictable amount of time.     Precautions / Restrictions Precautions Precautions: None Restrictions Weight Bearing Restrictions: No      Mobility  Bed Mobility Overal bed mobility: Modified  Independent                  Transfers Overall transfer level: Needs assistance Equipment used: None Transfers: Sit to/from Stand Sit to Stand: Supervision (for safety)                Ambulation/Gait Ambulation/Gait assistance: Min guard Gait Distance (Feet): 30 Feet Assistive device: None Gait Pattern/deviations: Step-through pattern, Decreased stride length Gait velocity: reduced Gait velocity interpretation: <1.31 ft/sec, indicative of household ambulator Pre-gait activities: standing orthostatics General Gait Details: pt is up to walk with supervision after intial CGA, no BP changes were noted and pt is able to maneuver on and off the bed  Stairs            Wheelchair Mobility    Modified Rankin (Stroke Patients Only)       Balance Overall balance assessment: History of Falls, Needs assistance Sitting-balance support: Feet supported Sitting balance-Leahy Scale: Good     Standing balance support: No upper extremity supported Standing balance-Leahy Scale: Fair                               Pertinent Vitals/Pain Pain Assessment Pain Assessment: No/denies pain    Home Living Family/patient expects to be discharged to:: Private residence Living Arrangements: Alone Available Help at Discharge: Family;Friend(s);Available PRN/intermittently Type of Home: House Home Access: Level entry       Home Layout: One level Home Equipment: Conservation officer, nature (2 wheels);Shower seat Additional Comments: has equipment from previous TKA    Prior Function Prior Level of Function : Independent/Modified Independent  Mobility Comments: no AD needed, no recent falls before this incident       Hand Dominance   Dominant Hand: Right    Extremity/Trunk Assessment   Upper Extremity Assessment Upper Extremity Assessment: Overall WFL for tasks assessed    Lower Extremity Assessment Lower Extremity Assessment: Overall WFL for tasks  assessed    Cervical / Trunk Assessment Cervical / Trunk Assessment: Normal  Communication   Communication: No difficulties  Cognition Arousal/Alertness: Awake/alert Behavior During Therapy: WFL for tasks assessed/performed Overall Cognitive Status: Within Functional Limits for tasks assessed                                          General Comments General comments (skin integrity, edema, etc.): pt is stable with gait taking her time.  Home with family assist but is not requiring an AD, no bp drops with mobility    Exercises     Assessment/Plan    PT Assessment Patient needs continued PT services  PT Problem List Decreased strength;Decreased mobility       PT Treatment Interventions DME instruction;Gait training;Functional mobility training;Therapeutic activities;Therapeutic exercise;Neuromuscular re-education;Balance training;Patient/family education    PT Goals (Current goals can be found in the Care Plan section)  Acute Rehab PT Goals Patient Stated Goal: to get home and feel better/stronger PT Goal Formulation: With patient Time For Goal Achievement: 12/28/21 Potential to Achieve Goals: Good    Frequency Min 3X/week     Co-evaluation               AM-PAC PT "6 Clicks" Mobility  Outcome Measure Help needed turning from your back to your side while in a flat bed without using bedrails?: None Help needed moving from lying on your back to sitting on the side of a flat bed without using bedrails?: A Little Help needed moving to and from a bed to a chair (including a wheelchair)?: A Little Help needed standing up from a chair using your arms (e.g., wheelchair or bedside chair)?: A Little Help needed to walk in hospital room?: A Little Help needed climbing 3-5 steps with a railing? : A Little 6 Click Score: 19    End of Session Equipment Utilized During Treatment: Gait belt Activity Tolerance: Patient tolerated treatment well Patient left: in  bed;with call bell/phone within reach;with nursing/sitter in room Nurse Communication: Mobility status PT Visit Diagnosis: Muscle weakness (generalized) (M62.81)    Time: 6962-9528 PT Time Calculation (min) (ACUTE ONLY): 21 min   Charges:   PT Evaluation $PT Eval Moderate Complexity: 1 Mod         Ramond Dial 12/21/2021, 1:21 PM  Mee Hives, PT PhD Acute Rehab Dept. Number: [place] [facility] [place]

## 2021-12-21 NOTE — Discharge Instructions (Addendum)
Follow up with primary care provider 

## 2021-12-21 NOTE — Inpatient Diabetes Management (Signed)
Inpatient Diabetes Program Recommendations  AACE/ADA: New Consensus Statement on Inpatient Glycemic Control (2015)  Target Ranges:  Prepandial:   less than 140 mg/dL      Peak postprandial:   less than 180 mg/dL (1-2 hours)      Critically ill patients:  140 - 180 mg/dL   Lab Results  Component Value Date   GLUCAP 224 (H) 12/21/2021   HGBA1C 6.9 (A) 12/18/2021    Review of Glycemic Control  Diabetes history: DM Outpatient Diabetes medications: 75/25 insulin bid  Inpatient Diabetes Program Recommendations:   Spoke with patient regarding diabetes management. Patient had been taking 75/25 20 units bid and add lunch dose of 15 units if elevated CBGs. Patient states she saw Dr. Kelton Pillar last Friday for the first time and understood to increase her insulin dose to 75/25 75 units ac bid. Dr. Quin Hoop note states to increase insulin doses to 24 units bid ac meals.  Reviewed hypoglycemia protocol and requested patient to discuss with Dr. Kelton Pillar insulin doses and CBGs.  Thank you, Nani Gasser. Avonda Toso, RN, MSN, CDE  Diabetes Coordinator Inpatient Glycemic Control Team Team Pager [phone] (8am-5pm) 12/21/2021 12:37 PM

## 2021-12-22 ENCOUNTER — Telehealth: Payer: Self-pay

## 2021-12-22 ENCOUNTER — Telehealth: Payer: Self-pay | Admitting: Internal Medicine

## 2021-12-22 LAB — URINE CULTURE: Culture: 20000 — AB

## 2021-12-22 NOTE — Telephone Encounter (Signed)
Clearance placed in Dr. Quay Burow blue folder for review.

## 2021-12-22 NOTE — Telephone Encounter (Signed)
Transition Care Management Follow-up Telephone Call Date of discharge and from where: [facility] 12-21-21 Dx: hypoglycemia How have you been since you were released from the hospital? Doing ok  Any questions or concerns? No  Items Reviewed: Did the pt receive and understand the discharge instructions provided? Yes  Medications obtained and verified? Yes  Other? No  Any new allergies since your discharge? No  Dietary orders reviewed? Yes Do you have support at home? Yes   Home Care and Equipment/Supplies: Were home health services ordered? no If so, what is the name of the agency? na  Has the agency set up a time to come to the patient's home? not applicable Were any new equipment or medical supplies ordered?  No What is the name of the medical supply agency? na Were you able to get the supplies/equipment? not applicable Do you have any questions related to the use of the equipment or supplies? No  Functional Questionnaire: (I = Independent and D = Dependent) ADLs: I  Bathing/Dressing- I  Meal Prep- I  Eating- I  Maintaining continence- I  Transferring/Ambulation- I  Managing Meds- I  Follow up appointments reviewed:  PCP Hospital f/u appt confirmed? Yes  Scheduled to see Dr Quay Burow on 12-28-21 @ [facility] f/u appt confirmed? Yes  Scheduled to see Dr Tomi Likens  on 12-23-21 @ 150pm and Dr Vaughan Browner 12-25-21 at 1pm. Are transportation arrangements needed? No  If their condition worsens, is the pt aware to call PCP or go to the Emergency Dept.? Yes Was the patient provided with contact information for the PCP's office or ED? Yes Was to pt encouraged to call back with questions or concerns? Yes   Juanda Crumble LPN [place] Direct Dial [phone]

## 2021-12-22 NOTE — Telephone Encounter (Signed)
For our records:  We have received Pre-Op PW for the pt from [facility] and it has been placed in Dr. Eilleen Kempf box.  Upon completion please fax the form to: [phone]

## 2021-12-22 NOTE — Progress Notes (Deleted)
NEUROLOGY FOLLOW UP OFFICE NOTE  [name] [id number]  Assessment/Plan:   Dizziness Headache - may be related to OSA and tension-type OSA -previously diagnosed as mild but headaches improved on CPAP - she has reconsidered and would like to proceed with sleep study   She will contact [facility] to inform them that she would like a sleep study To help further reduce headaches, will start topiramate '25mg'$  at bedtime.  We can increase dose in 6-8 weeks.  Discussed side effects such as paresthesias, trouble concentrating, kidney stones and glaucoma.  Refilled nortriptyline '100mg'$  at bedtime.  She would like to continue to help sleep.  Followed by cardiology.  EKG from last month revealed QT/QTc 506/470.  Discussed propensity for possibly prolonging QT interval.  If QTc becomes over 500, would discontinue. Follow up in 4 months.   Subjective:  [name] is a 81 year old woman with coronary artery disease, hypertension, type 2 diabetes mellitus, hyperlipidemia, B12 deficiency and obstructive sleep apnea and tension-type headache who follows up for dizziness and headache.   UPDATE: To further help with headaches, she was started on topiramate.  *** She was referred back to sleep medicine for sleep study.  Home sleep study last month did demonstrate occasional obstructive and central sleep apnea.  ***  Intensity:  Moderate to severe Duration:  Wakes up at 5 AM with headache.  Takes Tylenol and resolves by 11 AM.  However, it returns in the evening. Frequency:  They are now daily again. Frequency of abortive medication: Tylenol every 4 hours daily Current NSAIDS:  ASA '81mg'$  Current analgesics:  Tylenol Current triptans:  no Current ergotamine:  no Current anti-emetic:  Promethazine '25mg'$  Current muscle relaxants:  Baclofen '40mg'$  twice daily Current anti-anxiolytic:  Klonopin Current sleep aide:  trazodone Current Antihypertensive medications:  Toprol XL '50mg'$ , Cardizem, Lasix,  Imdur Current Antidepressant medications:  Nortriptyline 100 mg (helps her sleep, not effective for headache) Current Anticonvulsant medications:  topiramate '25mg'$  QHS *** Current anti-CGRP:  no Current Vitamins/Herbal/Supplements:  B12 Current Antihistamines/Decongestants:  Allegra, Singulair Other therapy:  ice pack   Caffeine:  1 cup coffee daily, tea Alcohol:  no Smoker:  no Diet:  hydrates Exercise:  yes Depression:  no; Anxiety:  no Other pain:  no Sleep hygiene:  Poor.  Untreated OSA.   She was taken off of CPAP by her prior sleep specialist because of reported noncompliance.  However, Ms. Levenhagen insists she wore the mask all night and headaches were improved while she used the machine.     HISTORY: Onset:  She has history of migraines as a young woman which resolved after having children.  Current headaches started in 2016. Location:  She has constant holocephalic headache.  She has severe headache radiating from the base of her neck on the right up Quality:  pounding Initial Intensity:  10/10 Aura:  no Prodrome:  no Postdrome:  no Associated symptoms:  None.  No nausea, vomiting, photophobia, phonophobia or visual disturbance.  She has not had any new worse headache of her life, waking up from sleep Initial Duration:  All day Initial Frequency:  Daily headache, severe  Headache every other day Initial Frequency of abortive medication: daily Triggers:  None Relieving factors:  None Activity:  aggravates   Past NSAIDS:  Ibuprofen, naproxen (caused increased heart rate) Past analgesics:  Excedrin, tramadol and other opioids (adverse reaction) Past abortive triptans:  no Past muscle relaxants:  Robaxin, Flexeril Past anti-emetic:  Promethazine (effective) Past antihypertensive medications:  losartan Past antidepressant medications:  no Past anticonvulsant medications:  Gabapentin '200mg'$  (drowsiness) Past vitamins/Herbal/Supplements:  CoQ10 Past  antihistamines/decongestants:  no Other past therapy:  Physical therapy of neck.   Family history of headache:  father   History of vertigo.  Seen in ED in July 2022.  MRI of brain revealed empty sella but otherwise no acute intracranial abnormality.  Occurs while walking or standing.  Not associated with quick head movements or turning over in bed.  Went to vestibular rehab which was ineffective.    MRI of brain without contrast from 09/16/14 revealed partially empty sella but otherwise unremarkable.   MRI of cervical spine from 09/16/14 demonstrated multilevel degenerative changes including left greater than right spurring at C3-C4 causing left foraminal narrowing, severe spurring at C4-C5 causing right moderate foraminal narrowing, and disc bulg and spurring at C6-C7 with moderate bilateral foraminal narrowing.  She was evaluated by Dr. Nelva Bush for interventional pain (epidural or facet joint injections).  He didn't think it was a good idea given that she would have to stop Plavix.  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     albuterol (PROVENTIL HFA;VENTOLIN HFA) 108 (90 BASE) MCG/ACT inhaler Inhale 2 puffs into the lungs every 6 (six) hours as needed. For shortness of breath. 1 Inhaler 0   albuterol (PROVENTIL) (2.5 MG/3ML) 0.083% nebulizer solution USE 1 VIAL IN NEBULIZER 4 TIMES DAILY (Patient taking differently: Take 2.5 mg by nebulization every 6 (six) hours as needed for wheezing or shortness of breath.) 360 mL 1   albuterol (PROVENTIL) (2.5 MG/3ML) 0.083% nebulizer solution Take 3 mLs (2.5 mg total) by nebulization every 6 (six) hours as needed for wheezing or shortness of breath. 75 mL 1   aspirin EC 81 MG tablet Take 1 tablet (81 mg total) by mouth daily.     baclofen (LIORESAL) 20 MG tablet TAKE 2 TABLETS BY MOUTH 2 TIMES DAILY. (Patient taking differently: Take 40 mg by mouth 2 (two) times daily.) 360 tablet 2   Bempedoic Acid-Ezetimibe (NEXLIZET) 180-10 MG TABS Take 1 tablet by mouth daily. 90 tablet 3   calcium carbonate (OS-CAL) 1250 (500 Ca) MG chewable tablet Chew 500 mg by mouth daily as needed for heartburn. (Patient not taking: Reported  on 12/20/2021)     Cholecalciferol (VITAMIN D) 50 MCG (2000 UT) tablet Take 4,000 Units by mouth daily.     Continuous Blood Gluc Receiver (FREESTYLE LIBRE 2 READER) DEVI 1 Device by Does not apply route every 14 (fourteen) days. 1 each 3   Continuous Blood Gluc Sensor (FREESTYLE LIBRE 2 SENSOR) MISC 1 Device by Does not apply route every 14 (fourteen) days. 6 each 3   Cyanocobalamin (VITAMIN B-12 IJ) Inject 1,000 mcg as directed every 30 (thirty) days.     diltiazem (CARDIZEM CD) 240 MG 24 hr capsule Take 1 capsule (240 mg total) by mouth daily. 90 capsule 2   Fluticasone-Umeclidin-Vilant (TRELEGY ELLIPTA) 200-62.5-25 MCG/ACT AEPB Inhale 1 puff into the lungs daily at 6 (six) AM. 1 each 0   furosemide (LASIX) 40 MG tablet Take 1 tablet (40 mg total)  by mouth daily. 30 tablet    glucose blood test strip Use as instructed to test blood sugars up to 4 times daily 200 each 12   insulin lispro protamine-lispro (HUMALOG 75/25 MIX) (75-25) 100 UNIT/ML SUSP injection Inject 24 Units into the skin 2 (two) times daily with a meal. 10 units at lunch depending on bs reading (Patient taking differently: Inject 75 Units into the skin 2 (two) times daily with a meal.) 50 mL 3   Insulin Syringe-Needle U-100 (INSULIN SYRINGE .3CC/29GX1") 29G X 1" 0.3 ML MISC 1 Device by Does not apply route in the morning and at bedtime. 200 each 3   isosorbide mononitrate (IMDUR) 60 MG 24 hr tablet Take one tablet ( 60 mg ) twice daily 8 hours apart. (Patient taking differently: 60 mg 2 (two) times daily. Take one tablet ( 60 mg ) twice daily 8 hours apart.) 180 tablet 3   meclizine (ANTIVERT) 25 MG tablet Take 25 mg by mouth 2 (two) times daily as needed for dizziness.     metoprolol succinate (TOPROL-XL) 100 MG 24 hr tablet TAKE 1 TABLET BY MOUTH EVERY DAY (Patient taking differently: Take 100 mg by mouth daily with supper.) 90 tablet 3   Multiple Vitamin (MULTIVITAMIN WITH MINERALS) TABS Take 1 tablet by mouth daily.     nitroGLYCERIN (NITROSTAT) 0.4 MG SL tablet Place 1 tablet (0.4 mg total) under the tongue every 5 (five) minutes as needed for chest pain. 25 tablet 3   pantoprazole (PROTONIX) 40 MG tablet Take 1 tablet (40 mg total) by mouth daily. 30 tablet 0   spironolactone (ALDACTONE) 25 MG tablet TAKE 1 TABLET BY MOUTH EVERY DAY (Patient taking differently: Take 25 mg by mouth every evening.) 90 tablet 3   Study - [facility] 4 - inclisiran 300 mg/1.49m or placebo SQ injection (PI-Stuckey) Inject 300 mg into the skin every 6 (six) months.     temazepam (RESTORIL) 30 MG capsule Take 30 mg by mouth daily.     topiramate (TOPAMAX) 25 MG tablet Take 1 tablet (25 mg total) by mouth at bedtime. 30 tablet 5   Current Facility-Administered Medications on File Prior to Visit   Medication Dose Route Frequency Provider Last Rate Last Admin   cyanocobalamin ((VITAMIN B-12)) injection 1,000 mcg  1,000 mcg Intramuscular Q30 days BBinnie Rail MD   1,000 mcg at 03/26/16 1658    ALLERGIES: Allergies  Allergen Reactions   Amlodipine Other (See Comments)    hallucinations    Banana Nausea And Vomiting    Stomach pumped   Co Q10 [Coenzyme Q10] Other (See Comments)    Body cramps  Codeine Other (See Comments)    Hallucinate, loose identity and don't know who I am   Insulin Aspart Other (See Comments)    Unknown   Lisinopril Other (See Comments)    nose bleed   Metformin And Related Nausea And Vomiting   Morphine Other (See Comments)    Can not function, it immobilizes me    Pentazocine Nausea And Vomiting   Pravastatin Other (See Comments)    Hands locked up   Repatha [Evolocumab] Other (See Comments)    myalgias   Statins Other (See Comments)    Muscle cramps   Sulfa Antibiotics Swelling   Sulfonamide Derivatives Swelling   Tramadol Hcl Other (See Comments)    Dizziness, cant function     FAMILY HISTORY: Family History  Problem Relation Age of Onset   Heart disease Maternal Grandfather    Heart failure Maternal Grandfather    Diabetes Maternal Grandfather    Heart attack Father    Diabetes Mother    Rheum arthritis Sister    Emphysema Paternal Uncle    Esophageal cancer Brother 33       she said he was born with it   Emphysema Paternal Aunt    Healthy Child    Neuropathy Neg Hx    Multiple sclerosis Neg Hx    Colon cancer Neg Hx    Colon polyps Neg Hx    Rectal cancer Neg Hx    Stomach cancer Neg Hx       Objective:  *** General: No acute distress.  Patient appears ***-groomed.   Head:  Normocephalic/atraumatic Eyes:  Fundi examined but not visualized Neck: supple, no paraspinal tenderness, full range of motion Heart:  Regular rate and rhythm Lungs:  Clear to auscultation bilaterally Back: No paraspinal tenderness Neurological  Exam: alert and oriented to person, place, and time.  Speech fluent and not dysarthric, language intact.  CN II-XII intact. Bulk and tone normal, muscle strength 5/5 throughout.  Sensation to light touch intact.  Deep tendon reflexes 2+ throughout, toes downgoing.  Finger to nose testing intact.  Gait normal, Romberg negative.   Metta Clines, DO  CC: ***

## 2021-12-23 ENCOUNTER — Encounter: Payer: Medicare Other | Admitting: *Deleted

## 2021-12-23 ENCOUNTER — Ambulatory Visit: Payer: Medicare Other | Admitting: Neurology

## 2021-12-23 ENCOUNTER — Encounter: Payer: Self-pay | Admitting: Neurology

## 2021-12-23 VITALS — BP 126/63 | HR 71 | Temp 98.3°F | Resp 20

## 2021-12-23 DIAGNOSIS — Z006 Encounter for examination for normal comparison and control in clinical research program: Secondary | ICD-10-CM

## 2021-12-23 DIAGNOSIS — Z029 Encounter for administrative examinations, unspecified: Secondary | ICD-10-CM

## 2021-12-23 MED ORDER — STUDY - ORION 4 - INCLISIRAN 300 MG/1.5 ML OR PLACEBO SQ INJECTION (PI-STUCKEY)
300.0000 mg | INJECTION | SUBCUTANEOUS | Status: DC
  Administered 2021-12-23: 300 mg via SUBCUTANEOUS
  Filled 2021-12-23: qty 1.5

## 2021-12-23 NOTE — Telephone Encounter (Signed)
Spoke with Jinny Blossom today from [facility].  A1C has to be below 7.5 for surgery.  Patient has appointment on 11/20 and will fax copy of A1C over to her.  Fax # [phone]

## 2021-12-23 NOTE — Telephone Encounter (Signed)
Clearance faxed this morning and fax conformation received.

## 2021-12-23 NOTE — Research (Signed)
[facility] 4  Patient doing well, no complaints of chest pains or shortness of breath.  No adverse events.  Only change in her meds is her bempedoic acid and ezetimibe (Nexletol) instead of just ezetimibe.   No labs drawn today   Injection given: LL abdomen at 1557  Next visit scheduled for 06/22/2022

## 2021-12-25 ENCOUNTER — Ambulatory Visit: Payer: Medicare Other | Admitting: Pulmonary Disease

## 2021-12-27 ENCOUNTER — Encounter: Payer: Self-pay | Admitting: Internal Medicine

## 2021-12-27 NOTE — Patient Instructions (Addendum)
      Medications changes include :       Return for follow up as scheduled - February .

## 2021-12-27 NOTE — Progress Notes (Unsigned)
Subjective:    Patient ID: [name], female    DOB: 02-12-1940, 81 y.o.   MRN: [id number]     HPI [name] is here for follow up from the hospital - TCM   In ED 12/20/21 - discharged home 12/21/21 for hypoglycemia and fall  She was brought to ED by EMS after unwitnessed fall and hypoglycemia.  She was found on the floor complaining of pain in her posterior head and neck.  She also had left sided chest discomfort.  She did not remember what happened.  Glucose by EMS was 50.  She was given oral glucose and food/drink.  Initial sugar in ED was 26 and she was hypothermic.  She was given D50.  She had head, neck pain and left chest discomfort.  She stated urine frequency.  She denied SOB, palpitations, nausea, vision changes.  Chest on left side was tender.  Head and neck were nontender.    Her insulin was recently changed.  She had not taken any insulin that day and had not eaten.  She did complain of RUQ pain.  [place] neg for acute finding.  Pain was severe - Ct scan ordered, which was w/o acute finding.  Ct head/neck negative for acute abnormality.  UA was w/o infection.  Blood work was unremarkable.   Glucose decreased and she was started on D10 drip.  Glucose eventually stabilized and she was discharged home.    Since being home - she did not do well when she first got home - her appetite has been low and has not improved.  She has not wanted to do anything - just laying around.  The past two days that has been better and she has started to do more.  She lost a lot of weight.  She is trying to eat what she likes, but her appetite is still low.    Sugars have been high - this am it was 276.  Her sugars have been in the 200's.   She takes 24 units of insulin twice a day.  She does have an appointment with endocrine.  Medications and allergies reviewed with patient and updated if appropriate.  Current Outpatient Medications on File Prior to Visit  Medication Sig Dispense Refill    acetaminophen (TYLENOL) 325 MG tablet Take 650 mg by mouth every 6 (six) hours as needed for moderate pain or headache.     albuterol (PROVENTIL HFA;VENTOLIN HFA) 108 (90 BASE) MCG/ACT inhaler Inhale 2 puffs into the lungs every 6 (six) hours as needed. For shortness of breath. 1 Inhaler 0   albuterol (PROVENTIL) (2.5 MG/3ML) 0.083% nebulizer solution USE 1 VIAL IN NEBULIZER 4 TIMES DAILY (Patient taking differently: Take 2.5 mg by nebulization every 6 (six) hours as needed for wheezing or shortness of breath.) 360 mL 1   aspirin EC 81 MG tablet Take 1 tablet (81 mg total) by mouth daily.     baclofen (LIORESAL) 20 MG tablet TAKE 2 TABLETS BY MOUTH 2 TIMES DAILY. (Patient taking differently: Take 40 mg by mouth 2 (two) times daily.) 360 tablet 2   Bempedoic Acid-Ezetimibe (NEXLIZET) 180-10 MG TABS Take 1 tablet by mouth daily. 90 tablet 3   calcium carbonate (OS-CAL) 1250 (500 Ca) MG chewable tablet Chew 500 mg by mouth daily as needed for heartburn.     Cholecalciferol (VITAMIN D) 50 MCG (2000 UT) tablet Take 4,000 Units by mouth daily.     Continuous Blood Gluc Receiver (FREESTYLE Marcus  2 READER) DEVI 1 Device by Does not apply route every 14 (fourteen) days. 1 each 3   Continuous Blood Gluc Sensor (FREESTYLE LIBRE 2 SENSOR) MISC 1 Device by Does not apply route every 14 (fourteen) days. 6 each 3   Cyanocobalamin (VITAMIN B-12 IJ) Inject 1,000 mcg as directed every 30 (thirty) days.     diltiazem (CARDIZEM CD) 240 MG 24 hr capsule Take 1 capsule (240 mg total) by mouth daily. 90 capsule 2   Fluticasone-Umeclidin-Vilant (TRELEGY ELLIPTA) 200-62.5-25 MCG/ACT AEPB Inhale 1 puff into the lungs daily at 6 (six) AM. 1 each 0   furosemide (LASIX) 40 MG tablet Take 1 tablet (40 mg total) by mouth daily. 30 tablet    glucose blood test strip Use as instructed to test blood sugars up to 4 times daily 200 each 12   insulin lispro protamine-lispro (HUMALOG 75/25 MIX) (75-25) 100 UNIT/ML SUSP injection Inject 24  Units into the skin 2 (two) times daily with a meal. 10 units at lunch depending on bs reading (Patient taking differently: Inject 24 Units into the skin 2 (two) times daily with a meal.) 50 mL 3   Insulin Syringe-Needle U-100 (INSULIN SYRINGE .3CC/29GX1") 29G X 1" 0.3 ML MISC 1 Device by Does not apply route in the morning and at bedtime. 200 each 3   isosorbide mononitrate (IMDUR) 60 MG 24 hr tablet Take one tablet ( 60 mg ) twice daily 8 hours apart. (Patient taking differently: 60 mg 2 (two) times daily. Take one tablet ( 60 mg ) twice daily 8 hours apart.) 180 tablet 3   meclizine (ANTIVERT) 25 MG tablet Take 25 mg by mouth 2 (two) times daily as needed for dizziness.     metoprolol succinate (TOPROL-XL) 100 MG 24 hr tablet TAKE 1 TABLET BY MOUTH EVERY DAY (Patient taking differently: Take 100 mg by mouth daily with supper.) 90 tablet 3   Multiple Vitamin (MULTIVITAMIN WITH MINERALS) TABS Take 1 tablet by mouth daily.     nitroGLYCERIN (NITROSTAT) 0.4 MG SL tablet Place 1 tablet (0.4 mg total) under the tongue every 5 (five) minutes as needed for chest pain. 25 tablet 3   pantoprazole (PROTONIX) 40 MG tablet Take 1 tablet (40 mg total) by mouth daily. 30 tablet 0   spironolactone (ALDACTONE) 25 MG tablet TAKE 1 TABLET BY MOUTH EVERY DAY (Patient taking differently: Take 25 mg by mouth every evening.) 90 tablet 3   Study - ORION 4 - inclisiran 300 mg/1.71m or placebo SQ injection (PI-Stuckey) Inject 300 mg into the skin every 6 (six) months.     temazepam (RESTORIL) 30 MG capsule Take 30 mg by mouth daily.     topiramate (TOPAMAX) 25 MG tablet Take 1 tablet (25 mg total) by mouth at bedtime. 30 tablet 5   Current Facility-Administered Medications on File Prior to Visit  Medication Dose Route Frequency Provider Last Rate Last Admin   cyanocobalamin ((VITAMIN B-12)) injection 1,000 mcg  1,000 mcg Intramuscular Q30 days BBinnie Rail MD   1,000 mcg at 03/26/16 1658   Study - ORION 4 - inclisiran  300 mg/1.521mor placebo SQ injection (PI-Stuckey)  300 mg Subcutaneous Q6 months StHillary BowMD   300 mg at 12/23/21 1557     Review of Systems  Constitutional:  Negative for chills and fever.  Respiratory:  Positive for cough, shortness of breath and wheezing.   Cardiovascular:  Positive for chest pain. Negative for palpitations and leg swelling.  Gastrointestinal:  Positive for abdominal pain (improving).  Neurological:  Positive for dizziness, light-headedness and headaches (daily).       Objective:   Vitals:   12/28/21 1112  BP: (!) 142/70  Pulse: 78  Resp: 18  Temp: 98 F (36.7 C)   BP Readings from Last 3 Encounters:  12/28/21 (!) 142/70  12/23/21 126/63  12/21/21 (!) 147/88   Wt Readings from Last 3 Encounters:  12/28/21 172 lb (78 kg)  12/20/21 172 lb (78 kg)  12/18/21 172 lb (78 kg)   Body mass index is 31.46 kg/m.    Physical Exam Constitutional:      General: She is not in acute distress.    Appearance: Normal appearance.  HENT:     Head: Normocephalic and atraumatic.  Eyes:     Conjunctiva/sclera: Conjunctivae normal.  Cardiovascular:     Rate and Rhythm: Normal rate and regular rhythm.     Heart sounds: Normal heart sounds. No murmur heard. Pulmonary:     Effort: Pulmonary effort is normal. No respiratory distress.     Breath sounds: Normal breath sounds. No wheezing.  Abdominal:     General: There is no distension.     Palpations: Abdomen is soft.     Tenderness: There is abdominal tenderness (Secondary to recent fall).  Musculoskeletal:     Cervical back: Neck supple.     Right lower leg: No edema.     Left lower leg: No edema.  Lymphadenopathy:     Cervical: No cervical adenopathy.  Skin:    General: Skin is warm and dry.     Findings: No rash.  Neurological:     Mental Status: She is alert. Mental status is at baseline.  Psychiatric:        Mood and Affect: Mood normal.        Behavior: Behavior normal.        Lab  Results  Component Value Date   WBC 7.2 12/20/2021   HGB 12.6 12/20/2021   HCT 40.3 12/20/2021   PLT 262 12/20/2021   GLUCOSE 211 (H) 12/20/2021   CHOL 108 10/21/2020   TRIG 125 10/21/2020   HDL 27 (L) 10/21/2020   LDLCALC 58 10/21/2020   ALT 16 12/20/2021   AST 25 12/20/2021   NA 139 12/20/2021   K 4.3 12/20/2021   CL 105 12/20/2021   CREATININE 0.80 12/20/2021   BUN 20 12/20/2021   CO2 26 12/20/2021   TSH 1.616 12/20/2021   INR 1.1 07/28/2021   HGBA1C 7.1 (A) 12/28/2021   HGBA1C 7.1 12/28/2021   HGBA1C 7.1 (A) 12/28/2021   HGBA1C 7.1 (A) 12/28/2021   MICROALBUR 1.6 03/16/2021   CT ABDOMEN PELVIS W CONTRAST CLINICAL DATA:  Abdominal pain, acute, nonlocalized  EXAM: CT ABDOMEN AND PELVIS WITH CONTRAST  TECHNIQUE: Multidetector CT imaging of the abdomen and pelvis was performed using the standard protocol following bolus administration of intravenous contrast.  RADIATION DOSE REDUCTION: This exam was performed according to the departmental dose-optimization program which includes automated exposure control, adjustment of the mA and/or kV according to patient size and/or use of iterative reconstruction technique.  CONTRAST:  102m OMNIPAQUE IOHEXOL 350 MG/ML SOLN  COMPARISON:  CT abdomen pelvis 01/04/2017  FINDINGS: Lower chest: Severe mitral annular calcifications. Coronary artery calcifications.  Hepatobiliary: No focal liver abnormality. No gallstones, gallbladder wall thickening, or pericholecystic fluid. No biliary dilatation.  Pancreas: No focal lesion. Normal pancreatic contour. No surrounding inflammatory changes. No main pancreatic ductal dilatation.  Spleen:  Normal in size. Stable nonspecific pericentimeter hypodensity.  Adrenals/Urinary Tract:  No adrenal nodule bilaterally.  Bilateral kidneys enhance symmetrically. Fluid density lesions within the bilateral kidneys likely represent simple renal cysts. The left lesion has increased in size  measuring up to 5.3 cm (from 4 cm in 2018). Simple renal cysts, in the absence of clinically indicated signs/symptoms, require no independent follow-up.  No hydronephrosis. No hydroureter.  The urinary bladder is unremarkable.  On delayed imaging, there is no urothelial wall thickening and there are no filling defects in the opacified portions of the bilateral collecting systems or ureters.  Stomach/Bowel: Stomach is within normal limits. Redemonstration of a fat density along the gastric pylorus/antrum likely represents a benign lipomatous mural lesion. No evidence of bowel wall thickening or dilatation. Colonic diverticulosis. The appendix is not definitely identified with no inflammatory changes in the right lower quadrant to suggest acute appendicitis.  Vascular/Lymphatic: No abdominal aorta or iliac aneurysm. Severe atherosclerotic plaque of the aorta and its branches. No abdominal, pelvic, or inguinal lymphadenopathy.  Reproductive: Status post hysterectomy. No adnexal masses.  Other: No intraperitoneal free fluid. No intraperitoneal free gas. No organized fluid collection.  Musculoskeletal:  No abdominal wall hernia or abnormality.  No suspicious lytic or blastic osseous lesions. No acute displaced fracture. Grade 1 anterolisthesis of L4 on L5.  IMPRESSION: 1. Colonic diverticulosis with no acute diverticulitis. 2. Aortic Atherosclerosis (ICD10-I70.0) including coronary calcification severe mitral annular calcification.  Electronically Signed   By: Iven Finn M.D.   On: 12/20/2021 18:13 US Abdomen Limited RUQ (LIVER/GB) CLINICAL DATA:  Right upper quadrant pain  EXAM: ULTRASOUND ABDOMEN LIMITED RIGHT UPPER QUADRANT  COMPARISON:  CT AP 01/05/2017  FINDINGS: Gallbladder:  Gallstones identified which measure up to 7 mm. No gallbladder wall thickening, pericholecystic fluid or sonographic Murphy's sign.  Common bile duct:  Diameter: 3 mm.  No  intrahepatic bile duct dilatation.  Liver:  Contour of the liver appears slightly nodular. No focal lesion identified. Within normal limits in parenchymal echogenicity. Portal vein is patent on color Doppler imaging with normal direction of blood flow towards the liver.  Other: None.  IMPRESSION: 1. Gallstones. No secondary signs of acute cholecystitis. 2. Slight nodular contour of the liver raising the possibility of cirrhosis.  Electronically Signed   By: Kerby Moors M.D.   On: 12/20/2021 12:54 CT Cervical Spine Wo Contrast CLINICAL DATA:  Head trauma. Un witnessed fall. Patient is on Eliquis. Complains of posterior head pain.  EXAM: CT HEAD WITHOUT CONTRAST  CT CERVICAL SPINE WITHOUT CONTRAST  TECHNIQUE: Multidetector CT imaging of the head and cervical spine was performed following the standard protocol without intravenous contrast. Multiplanar CT image reconstructions of the cervical spine were also generated.  RADIATION DOSE REDUCTION: This exam was performed according to the departmental dose-optimization program which includes automated exposure control, adjustment of the mA and/or kV according to patient size and/or use of iterative reconstruction technique.  COMPARISON:  Brain MRI 11/28/2021  FINDINGS: CT HEAD FINDINGS  Brain: No evidence of acute infarction, hemorrhage, hydrocephalus, extra-axial collection or mass lesion/mass effect.  Vascular: No hyperdense vessel or unexpected calcification.  Skull: No hyperdense vessel or unexpected calcification.  Sinuses/Orbits: No acute finding.  Other: None  CT CERVICAL SPINE FINDINGS  Alignment: Detail is diminished due to motion artifact. Stepwise anterolisthesis of C4 on C5 and C5 on C6 is likely related to chronic cervical spondylosis. There are no signs of acute posttraumatic malalignment of the cervical spine.  Skull base and vertebrae:  No acute fracture or dislocation.  Soft tissues and  spinal canal: No prevertebral fluid or swelling. No visible canal hematoma.  Disc levels: Multi level disc space narrowing and endplate spurring is noted throughout the cervical spine. This is most severe at the C6-7 level  Upper chest: No acute findings.  Centrilobular emphysema.  Other: None  IMPRESSION: 1. No evidence for acute intracranial abnormality. 2. No evidence for acute cervical spine fracture or posttraumatic malalignment. 3. Multilevel degenerative disc disease and cervical spondylosis. 4.  Emphysema (ICD10-J43.9).  Electronically Signed   By: Kerby Moors M.D.   On: 12/20/2021 10:08 CT HEAD WO CONTRAST (5MM) CLINICAL DATA:  Head trauma. Un witnessed fall. Patient is on Eliquis. Complains of posterior head pain.  EXAM: CT HEAD WITHOUT CONTRAST  CT CERVICAL SPINE WITHOUT CONTRAST  TECHNIQUE: Multidetector CT imaging of the head and cervical spine was performed following the standard protocol without intravenous contrast. Multiplanar CT image reconstructions of the cervical spine were also generated.  RADIATION DOSE REDUCTION: This exam was performed according to the departmental dose-optimization program which includes automated exposure control, adjustment of the mA and/or kV according to patient size and/or use of iterative reconstruction technique.  COMPARISON:  Brain MRI 11/28/2021  FINDINGS: CT HEAD FINDINGS  Brain: No evidence of acute infarction, hemorrhage, hydrocephalus, extra-axial collection or mass lesion/mass effect.  Vascular: No hyperdense vessel or unexpected calcification.  Skull: No hyperdense vessel or unexpected calcification.  Sinuses/Orbits: No acute finding.  Other: None  CT CERVICAL SPINE FINDINGS  Alignment: Detail is diminished due to motion artifact. Stepwise anterolisthesis of C4 on C5 and C5 on C6 is likely related to chronic cervical spondylosis. There are no signs of acute posttraumatic malalignment of the  cervical spine.  Skull base and vertebrae: No acute fracture or dislocation.  Soft tissues and spinal canal: No prevertebral fluid or swelling. No visible canal hematoma.  Disc levels: Multi level disc space narrowing and endplate spurring is noted throughout the cervical spine. This is most severe at the C6-7 level  Upper chest: No acute findings.  Centrilobular emphysema.  Other: None  IMPRESSION: 1. No evidence for acute intracranial abnormality. 2. No evidence for acute cervical spine fracture or posttraumatic malalignment. 3. Multilevel degenerative disc disease and cervical spondylosis. 4.  Emphysema (ICD10-J43.9).  Electronically Signed   By: Kerby Moors M.D.   On: 12/20/2021 10:08 DG Chest Portable 1 View CLINICAL DATA:  81 year old female with history of hypoglycemia and fall.  EXAM: PORTABLE CHEST 1 VIEW  COMPARISON:  Chest x-ray 02/18/2021.  FINDINGS: Lung volumes are normal. No consolidative airspace disease. No pleural effusions. No pneumothorax. No pulmonary nodule or mass noted. Pulmonary vasculature and the cardiomediastinal silhouette are within normal limits. Atherosclerosis in the thoracic aorta. Status post right shoulder arthroplasty. Status post median sternotomy for CABG including LIMA. Bony thorax appears grossly intact.  IMPRESSION: 1. No radiographic evidence of significant acute traumatic injury to the thorax. 2. Aortic atherosclerosis.  Electronically Signed   By: Vinnie Langton M.D.   On: 12/20/2021 09:45    Assessment & Plan:    See Problem List for Assessment and Plan of chronic medical problems.

## 2021-12-28 ENCOUNTER — Ambulatory Visit (INDEPENDENT_AMBULATORY_CARE_PROVIDER_SITE_OTHER): Payer: Medicare Other | Admitting: Internal Medicine

## 2021-12-28 VITALS — BP 142/70 | HR 78 | Temp 98.0°F | Resp 18 | Ht 62.0 in | Wt 172.0 lb

## 2021-12-28 DIAGNOSIS — E1059 Type 1 diabetes mellitus with other circulatory complications: Secondary | ICD-10-CM | POA: Diagnosis not present

## 2021-12-28 DIAGNOSIS — I10 Essential (primary) hypertension: Secondary | ICD-10-CM

## 2021-12-28 DIAGNOSIS — E162 Hypoglycemia, unspecified: Secondary | ICD-10-CM

## 2021-12-28 LAB — POCT GLYCOSYLATED HEMOGLOBIN (HGB A1C)
HbA1c POC (<> result, manual entry): 7.1 % (ref 4.0–5.6)
HbA1c, POC (controlled diabetic range): 7.1 % — AB (ref 0.0–7.0)
HbA1c, POC (prediabetic range): 7.1 % — AB (ref 5.7–6.4)
Hemoglobin A1C: 7.1 % — AB (ref 4.0–5.6)

## 2021-12-28 NOTE — Telephone Encounter (Signed)
Clearance faxed today

## 2021-12-28 NOTE — Assessment & Plan Note (Signed)
Recent hospitalization for low sugar after insulin was increased Syncope secondary to hypoglycemia glucose of 26 by EMS Sugar can drop over time with D50 and eventual D10 drip Since being home and going back on previous insulin dose sugars have been in the 200s and she has not had any episodes of hypoglycemia Has appointment with endocrine for follow-up

## 2021-12-28 NOTE — Assessment & Plan Note (Signed)
Chronic Blood pressure adequately controlled for her Continue diltiazem to 40 mg daily, Imdur 60 mg twice daily, metoprolol XL 100 mg daily, spironolactone 25 mg daily

## 2021-12-28 NOTE — Assessment & Plan Note (Addendum)
Chronic Difficult to control sugars for a while Recent hypoglycemic event with glucose down to 26 after increase in insulin She is doing her previous insulin regimen of 24 units twice daily Sugar in 200s A1c here today is 7.1% Has appointment with endocrine and advised that she stays on her current regimen until then.  She is not eating consistently so I do not think this is a time to adjust insulin further Advised her to let me know if there is any issues before she sees endocrine.  Encouraged her to try to make sure she is eating consistently which may help to regulate her sugars and help to better adjust the insulin

## 2022-01-01 ENCOUNTER — Ambulatory Visit: Payer: Medicare Other | Admitting: Primary Care

## 2022-01-29 ENCOUNTER — Ambulatory Visit: Payer: Medicare Other | Attending: Physician Assistant | Admitting: Physician Assistant

## 2022-01-29 NOTE — Progress Notes (Unsigned)
Office Visit    Patient Name: [name] Date of Encounter: 01/29/2022  PCP:  Binnie Rail, MD   [place]  Cardiologist:  Mertie Moores, MD  Advanced Practice Provider:  Liliane Shi, PA-C Electrophysiologist:  None   HPI    [name] is a 81 y.o. female with a past medical history significant for chronic diastolic CHF, carotid artery disease, asthma, anxiety, CAD status post CABG 2009 with most recent cardiac catheterization 12/2016 with patent grafts, hypertension, hyperlipidemia presents today for follow-up appointment.  She was last seen 10/30/2021 and at that time that she needed a shoulder replacement and was undergoing cardiac clearance.  Echocardiogram was ordered 11/17/2021 for [facility] which showed hyperdynamic left ventricular function, EF 75%.  Diastolic function cannot be evaluated.  She was considered low risk for upcoming surgery and was cleared.  She did have a cardiac catheterization 07/2021 which showed no significant change when compared to previous cardiac catheterizations.  Medical therapy was recommended at that time.  Today, she ***  Past Medical History    Past Medical History:  Diagnosis Date   Adenomatous colon polyp    Allergy    Anxiety    Asthma    Carotid artery disease ([place])    carotid US 02/2017: bilat ICA 1-39%   Chronic diastolic CHF    Echo 08/6158: EF 65-70, Gr 2 DD, mild MS (mean 5), PASP 44   COPD    pt is unsure if has been officially diagnosed   Coronary artery disease    CABG '09- cathed 12/09, 9/10, 6/11, 3/14 and 12/13/16- medical Rx // cath 12/2016 - 2/3 grafts patent >> med Rx // Myoview 12/17: low risk   Diabetes mellitus    Gastroesophageal reflux disease    Hiatal hernia    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   Hyperlipidemia    Hypertension    PONV (postoperative nausea and vomiting)    Schatzki's ring    Shoulder injury    resolved after shoulder surgery   Sleep apnea    not on  cpap   Past Surgical History:  Procedure Laterality Date   ABDOMINAL HYSTERECTOMY     APPENDECTOMY     came out with Hysterectomy   CARDIAC CATHETERIZATION  07/23/2009   EF 60%   CARDIAC CATHETERIZATION  10/11/2008   CARDIAC CATHETERIZATION  03/01/2007   EF 75-80%   CARDIOVASCULAR STRESS TEST  11/15/2007   EF 60%   COLONOSCOPY     CORONARY ARTERY BYPASS GRAFT     SEVERELY DISEASED SAPHENOUS VEIN GRAFT TO THE RIGHT CORONARY ARTERY BUT WITH FAIRLY WELL PRESERVED FLOW TO THE DISTAL RIGHT CORONARY ARTERY FROM THE NATIVE CIRCULATION-RESTART  CATH IN JUNE 2000, REVEALS MILD/MODERATE  CAD WITH GOOD FLOW DOWN HER LAD   ESOPHAGOGASTRODUODENOSCOPY     EYE SURGERY     bilateral cataract surgery with lens implant   LEFT HEART CATH AND CORS/GRAFTS ANGIOGRAPHY N/A 12/14/2016   Procedure: LEFT HEART CATH AND CORS/GRAFTS ANGIOGRAPHY;  Surgeon: Jettie Booze, MD;  Location: [facility];  Service: Cardiovascular;  Laterality: N/A;   lense removal Left    POLYPECTOMY     RIGHT/LEFT HEART CATH AND CORONARY/GRAFT ANGIOGRAPHY N/A 07/28/2021   Procedure: RIGHT/LEFT HEART CATH AND CORONARY/GRAFT ANGIOGRAPHY;  Surgeon: Troy Sine, MD;  Location: [facility];  Service: Cardiovascular;  Laterality: N/A;   ROTATOR CUFF REPAIR  right and left   TONSILLECTOMY     age 50   TOTAL KNEE ARTHROPLASTY Right 02/20/2014   Procedure: RIGHT TOTAL KNEE ARTHROPLASTY;  Surgeon: Tobi Bastos, MD;  Location: [facility];  Service: Orthopedics;  Laterality: Right;   tumor removed kidney     UPPER GASTROINTESTINAL ENDOSCOPY     US ECHOCARDIOGRAPHY  03/08/2008   EF 55-60%    Allergies  Allergies  Allergen Reactions   Amlodipine Other (See Comments)    hallucinations    Banana Nausea And Vomiting    Stomach pumped   Co Q10 [Coenzyme Q10] Other (See Comments)    Body cramps   Codeine Other (See Comments)    Hallucinate, loose identity and don't know who I am   Insulin Aspart Other (See Comments)     Unknown   Lisinopril Other (See Comments)    nose bleed   Metformin And Related Nausea And Vomiting   Morphine Other (See Comments)    Can not function, it immobilizes me    Pentazocine Nausea And Vomiting   Pravastatin Other (See Comments)    Hands locked up   Repatha [Evolocumab] Other (See Comments)    myalgias   Statins Other (See Comments)    Muscle cramps   Sulfa Antibiotics Swelling   Sulfonamide Derivatives Swelling   Tramadol Hcl Other (See Comments)    Dizziness, cant function      EKGs/Labs/Other Studies Reviewed:   The following studies were reviewed today:  Echocardiogram 11/17/2021  IMPRESSIONS     1. Left ventricular ejection fraction, by estimation, is >75%. The left  ventricle has hyperdynamic function. The left ventricle has no regional  wall motion abnormalities. Left ventricular diastolic function could not  be evaluated.   2. Right ventricular systolic function is normal. The right ventricular  size is normal. Tricuspid regurgitation signal is inadequate for assessing  PA pressure.   3. Left atrial size was mildly dilated.   4. The mitral valve is normal in structure. No evidence of mitral valve  regurgitation. Mild mitral stenosis. Severe mitral annular calcification.   5. The aortic valve is tricuspid. Aortic valve regurgitation is not  visualized. Aortic valve sclerosis/calcification is present, without any  evidence of aortic stenosis.   6. The inferior vena cava is normal in size with greater than 50%  respiratory variability, suggesting right atrial pressure of 3 mmHg.   FINDINGS   Left Ventricle: Left ventricular ejection fraction, by estimation, is  >75%. The left ventricle has hyperdynamic function. The left ventricle has  no regional wall motion abnormalities. The left ventricular internal  cavity size was normal in size. There  is no left ventricular hypertrophy. Left ventricular diastolic function  could not be evaluated due to  mitral annular calcification (moderate or  greater). Left ventricular diastolic function could not be evaluated.   Right Ventricle: The right ventricular size is normal. Right ventricular  systolic function is normal. Tricuspid regurgitation signal is inadequate  for assessing PA pressure. The tricuspid regurgitant velocity is 2.79 m/s,  and with an assumed right atrial   pressure of 3 mmHg, the estimated right ventricular systolic pressure is  24.2 mmHg.   Left Atrium: Left atrial size was mildly dilated.   Right Atrium: Right atrial size was normal in size.   Pericardium: There is no evidence of pericardial effusion.   Mitral Valve: The mitral valve is normal in structure. Severe mitral  annular calcification. No evidence of mitral valve regurgitation. Mild  mitral  valve stenosis. MV peak gradient, 13.2 mmHg. The mean mitral valve  gradient is 5.0 mmHg.   Tricuspid Valve: The tricuspid valve is normal in structure. Tricuspid  valve regurgitation is trivial. No evidence of tricuspid stenosis.   Aortic Valve: The aortic valve is tricuspid. Aortic valve regurgitation is  not visualized. Aortic valve sclerosis/calcification is present, without  any evidence of aortic stenosis.   Pulmonic Valve: The pulmonic valve was not well visualized. Pulmonic valve  regurgitation is trivial. No evidence of pulmonic stenosis.   Aorta: The aortic root is normal in size and structure.   Venous: The inferior vena cava is normal in size with greater than 50%  respiratory variability, suggesting right atrial pressure of 3 mmHg.   IAS/Shunts: No atrial level shunt detected by color flow Doppler.   Cardiac cath 07/28/21  Left Anterior Descending  Ost LAD to Prox LAD lesion is 60% stenosed.    First Diagonal Branch  1st Diag lesion is 100% stenosed.    Right Coronary Artery  Prox RCA lesion is 25% stenosed.  Mid RCA lesion is 25% stenosed.    Graft To 1st Diag    LIMA Graft To Mid LAD     Graft To RPDA  Origin to Prox Graft lesion is 100% stenosed.    Intervention   No interventions have been documented.   Right Heart  Right Atrium RA: a 10, V 9; mean 6 RV: 41/10 PA: 42/17; mean 25 PW: a 14; V 10; mean 10 AO: 140/61  Pullback:  LV:158/16 AO: 148/69  AO saturation 92% and pulmonary artery saturation 62%.  Cardiac output by the thermodilution method 3.5 and by the Fick method 4.9 L/min. Cardiac index by thermodilution method 1.9 and by the Fick method 2.7 L/min/m.   Wall Motion              Left Heart  Left Ventricle The left ventricular size is normal. LV end diastolic pressure is normal. The left ventricular ejection fraction is 55-65% by visual estimate. No regional wall motion abnormalities.  Mitral Valve The annulus is calcified.   Coronary Diagrams  Diagnostic Dominance: Right  Intervention  EKG:  EKG is *** ordered today.  The ekg ordered today demonstrates ***  Recent Labs: 07/22/2021: NT-Pro BNP 574 12/20/2021: ALT 16; B Natriuretic Peptide 101.9; BUN 20; Creatinine, Ser 0.80; Hemoglobin 12.6; Magnesium 2.2; Platelets 262; Potassium 4.3; Sodium 139; TSH 1.616  Recent Lipid Panel    Component Value Date/Time   CHOL 108 10/21/2020 0843   TRIG 125 10/21/2020 0843   HDL 27 (L) 10/21/2020 0843   CHOLHDL 4.0 10/21/2020 0843   CHOLHDL 6 12/19/2019 1437   VLDL 32.4 12/19/2019 1437   LDLCALC 58 10/21/2020 0843    Risk Assessment/Calculations:  {Does this patient have ATRIAL FIBRILLATION?[phone]}  Home Medications   No outpatient medications have been marked as taking for the 01/29/22 encounter (Appointment) with Elgie Collard, PA-C.   Current Facility-Administered Medications for the 01/29/22 encounter (Appointment) with Elgie Collard, PA-C  Medication   cyanocobalamin ((VITAMIN B-12)) injection 1,000 mcg   Study - ORION 4 - inclisiran 300 mg/1.36m or placebo SQ injection (PI-Stuckey)     Review of Systems   ***   All  other systems reviewed and are otherwise negative except as noted above.  Physical Exam    VS:  LMP  (LMP Unknown)  , BMI There is no height or weight on file to calculate BMI.  Wt Readings from Last 3  Encounters:  12/28/21 172 lb (78 kg)  12/20/21 172 lb (78 kg)  12/18/21 172 lb (78 kg)     GEN: Well nourished, well developed, in no acute distress. HEENT: normal. Neck: Supple, no JVD, carotid bruits, or masses. Cardiac: ***RRR, no murmurs, rubs, or gallops. No clubbing, cyanosis, edema.  ***Radials/PT 2+ and equal bilaterally.  Respiratory:  ***Respirations regular and unlabored, clear to auscultation bilaterally. GI: Soft, nontender, nondistended. MS: No deformity or atrophy. Skin: Warm and dry, no rash. Neuro:  Strength and sensation are intact. Psych: Normal affect.  Assessment & Plan    Coronary artery disease Hypertension Hyperlipidemia COPD  No BP recorded.  {Refresh Note OR Click here to enter BP  :1}***      Disposition: Follow up {follow up:15908} with Mertie Moores, MD or APP.  Signed, Elgie Collard, PA-C 01/29/2022, 10:35 AM [place]

## 2022-02-02 NOTE — Progress Notes (Unsigned)
NEUROLOGY FOLLOW UP OFFICE NOTE  [name] [id number]  Assessment/Plan:   Dizziness Headache - may be related to OSA and tension-type Prolonged QT interval OSA -previously diagnosed as mild but headaches improved on CPAP - she has reconsidered and would like to proceed with sleep study   She will contact [facility] to inform them that she would like a sleep study Topiramate *** Follow up in 4 months.   Subjective:  [name] is a 81 year old woman with coronary artery disease, hypertension, type 2 diabetes mellitus, hyperlipidemia, B12 deficiency and obstructive sleep apnea and tension-type headache who follows up for dizziness and headache.   UPDATE: Started topiramate.  *** EKG from November reveals QT/QTc 510/527.  Nortriptyline was discontinued.    Intensity:  Moderate to severe Duration:  Wakes up at 5 AM with headache.  Takes Tylenol and resolves by 11 AM.  However, it returns in the evening. Frequency:  They are now daily again. Frequency of abortive medication: Tylenol every 4 hours daily Current NSAIDS:  ASA '81mg'$  Current analgesics:  Tylenol Current triptans:  no Current ergotamine:  no Current anti-emetic:  Promethazine '25mg'$  Current muscle relaxants:  Baclofen '40mg'$  twice daily Current anti-anxiolytic:  Klonopin Current sleep aide:  trazodone Current Antihypertensive medications:  Toprol XL '50mg'$ , Cardizem, Lasix, Imdur Current Antidepressant medications:  none Current Anticonvulsant medications:  topiramate '25mg'$  QHS Current anti-CGRP:  no Current Vitamins/Herbal/Supplements:  B12 Current Antihistamines/Decongestants:  Allegra, Singulair Other therapy:  ice pack   Caffeine:  1 cup coffee daily, tea Alcohol:  no Smoker:  no Diet:  hydrates Exercise:  yes Depression:  no; Anxiety:  no Other pain:  no Sleep hygiene:  Poor.  Untreated OSA.   She was taken off of CPAP by her prior sleep specialist because of reported noncompliance.  However, Ms.  [name] insists she wore the mask all night and headaches were improved while she used the machine.     HISTORY: Onset:  She has history of migraines as a young woman which resolved after having children.  Current headaches started in 2016. Location:  She has constant holocephalic headache.  She has severe headache radiating from the base of her neck on the right up Quality:  pounding Initial Intensity:  10/10 Aura:  no Prodrome:  no Postdrome:  no Associated symptoms:  None.  No nausea, vomiting, photophobia, phonophobia or visual disturbance.  She has not had any new worse headache of her life, waking up from sleep Initial Duration:  All day Initial Frequency:  Daily headache, severe  Headache every other day Initial Frequency of abortive medication: daily Triggers:  None Relieving factors:  None Activity:  aggravates   Past NSAIDS:  Ibuprofen, naproxen (caused increased heart rate) Past analgesics:  Excedrin, tramadol and other opioids (adverse reaction) Past abortive triptans:  no Past muscle relaxants:  Robaxin, Flexeril Past anti-emetic:  Promethazine (effective) Past antihypertensive medications:  losartan Past antidepressant medications:  nortriptyline (prolonged QT interval) Past anticonvulsant medications:  Gabapentin '200mg'$  (drowsiness) Past vitamins/Herbal/Supplements:  CoQ10 Past antihistamines/decongestants:  no Other past therapy:  Physical therapy of neck.   Family history of headache:  father   History of vertigo.  Seen in ED in July 2022.  MRI of brain revealed empty sella but otherwise no acute intracranial abnormality.  Occurs while walking or standing.  Not associated with quick head movements or turning over in bed.  Went to vestibular rehab which was ineffective.    MRI of brain without contrast from 09/16/14 revealed  partially empty sella but otherwise unremarkable.   MRI of cervical spine from 09/16/14 demonstrated multilevel degenerative changes including  left greater than right spurring at C3-C4 causing left foraminal narrowing, severe spurring at C4-C5 causing right moderate foraminal narrowing, and disc bulg and spurring at C6-C7 with moderate bilateral foraminal narrowing.  She was evaluated by Dr. Nelva Bush for interventional pain (epidural or facet joint injections).  He didn't think it was a good idea given that she would have to stop Plavix.  PAST MEDICAL HISTORY: Past Medical History:  Diagnosis Date   Adenomatous colon polyp    Allergy    Anxiety    Asthma    Carotid artery disease ([place])    carotid US 02/2017: bilat ICA 1-39%   Chronic diastolic CHF    Echo 03/4266: EF 65-70, Gr 2 DD, mild MS (mean 5), PASP 44   COPD    pt is unsure if has been officially diagnosed   Coronary artery disease    CABG '09- cathed 12/09, 9/10, 6/11, 3/14 and 12/13/16- medical Rx // cath 12/2016 - 2/3 grafts patent >> med Rx // Myoview 12/17: low risk   Diabetes mellitus    Gastroesophageal reflux disease    Hiatal hernia    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   Hyperlipidemia    Hypertension    PONV (postoperative nausea and vomiting)    Schatzki's ring    Shoulder injury    resolved after shoulder surgery   Sleep apnea    not on cpap    MEDICATIONS: Current Outpatient Medications on File Prior to Visit  Medication Sig Dispense Refill   acetaminophen (TYLENOL) 325 MG tablet Take 650 mg by mouth every 6 (six) hours as needed for moderate pain or headache.     albuterol (PROVENTIL HFA;VENTOLIN HFA) 108 (90 BASE) MCG/ACT inhaler Inhale 2 puffs into the lungs every 6 (six) hours as needed. For shortness of breath. 1 Inhaler 0   albuterol (PROVENTIL) (2.5 MG/3ML) 0.083% nebulizer solution USE 1 VIAL IN NEBULIZER 4 TIMES DAILY (Patient taking differently: Take 2.5 mg by nebulization every 6 (six) hours as needed for wheezing or shortness of breath.) 360 mL 1   aspirin EC 81 MG tablet Take 1 tablet (81 mg total) by mouth daily.     baclofen  (LIORESAL) 20 MG tablet TAKE 2 TABLETS BY MOUTH 2 TIMES DAILY. (Patient taking differently: Take 40 mg by mouth 2 (two) times daily.) 360 tablet 2   Bempedoic Acid-Ezetimibe (NEXLIZET) 180-10 MG TABS Take 1 tablet by mouth daily. 90 tablet 3   calcium carbonate (OS-CAL) 1250 (500 Ca) MG chewable tablet Chew 500 mg by mouth daily as needed for heartburn.     Cholecalciferol (VITAMIN D) 50 MCG (2000 UT) tablet Take 4,000 Units by mouth daily.     Continuous Blood Gluc Receiver (FREESTYLE LIBRE 2 READER) DEVI 1 Device by Does not apply route every 14 (fourteen) days. 1 each 3   Continuous Blood Gluc Sensor (FREESTYLE LIBRE 2 SENSOR) MISC 1 Device by Does not apply route every 14 (fourteen) days. 6 each 3   Cyanocobalamin (VITAMIN B-12 IJ) Inject 1,000 mcg as directed every 30 (thirty) days.     diltiazem (CARDIZEM CD) 240 MG 24 hr capsule Take 1 capsule (240 mg total) by mouth daily. 90 capsule 2   Fluticasone-Umeclidin-Vilant (TRELEGY ELLIPTA) 200-62.5-25 MCG/ACT AEPB Inhale 1 puff into the lungs daily at 6 (six) AM. 1 each 0   furosemide (LASIX)  40 MG tablet Take 1 tablet (40 mg total) by mouth daily. 30 tablet    glucose blood test strip Use as instructed to test blood sugars up to 4 times daily 200 each 12   insulin lispro protamine-lispro (HUMALOG 75/25 MIX) (75-25) 100 UNIT/ML SUSP injection Inject 24 Units into the skin 2 (two) times daily with a meal. 10 units at lunch depending on bs reading (Patient taking differently: Inject 24 Units into the skin 2 (two) times daily with a meal.) 50 mL 3   Insulin Syringe-Needle U-100 (INSULIN SYRINGE .3CC/29GX1") 29G X 1" 0.3 ML MISC 1 Device by Does not apply route in the morning and at bedtime. 200 each 3   isosorbide mononitrate (IMDUR) 60 MG 24 hr tablet Take one tablet ( 60 mg ) twice daily 8 hours apart. (Patient taking differently: 60 mg 2 (two) times daily. Take one tablet ( 60 mg ) twice daily 8 hours apart.) 180 tablet 3   meclizine (ANTIVERT) 25 MG  tablet Take 25 mg by mouth 2 (two) times daily as needed for dizziness.     metoprolol succinate (TOPROL-XL) 100 MG 24 hr tablet TAKE 1 TABLET BY MOUTH EVERY DAY (Patient taking differently: Take 100 mg by mouth daily with supper.) 90 tablet 3   Multiple Vitamin (MULTIVITAMIN WITH MINERALS) TABS Take 1 tablet by mouth daily.     nitroGLYCERIN (NITROSTAT) 0.4 MG SL tablet Place 1 tablet (0.4 mg total) under the tongue every 5 (five) minutes as needed for chest pain. 25 tablet 3   pantoprazole (PROTONIX) 40 MG tablet Take 1 tablet (40 mg total) by mouth daily. 30 tablet 0   spironolactone (ALDACTONE) 25 MG tablet TAKE 1 TABLET BY MOUTH EVERY DAY (Patient taking differently: Take 25 mg by mouth every evening.) 90 tablet 3   Study - ORION 4 - inclisiran 300 mg/1.39m or placebo SQ injection (PI-[facility]) Inject 300 mg into the skin every 6 (six) months.     temazepam (RESTORIL) 30 MG capsule Take 30 mg by mouth daily.     topiramate (TOPAMAX) 25 MG tablet Take 1 tablet (25 mg total) by mouth at bedtime. 30 tablet 5   Current Facility-Administered Medications on File Prior to Visit  Medication Dose Route Frequency Provider Last Rate Last Admin   cyanocobalamin ((VITAMIN B-12)) injection 1,000 mcg  1,000 mcg Intramuscular Q30 days BBinnie Rail MD   1,000 mcg at 03/26/16 1658   Study - ORION 4 - inclisiran 300 mg/1.51mor placebo SQ injection (PI-Stuckey)  300 mg Subcutaneous Q6 months StHillary BowMD   300 mg at 12/23/21 1557    ALLERGIES: Allergies  Allergen Reactions   Amlodipine Other (See Comments)    hallucinations    Banana Nausea And Vomiting    Stomach pumped   Co Q10 [Coenzyme Q10] Other (See Comments)    Body cramps   Codeine Other (See Comments)    Hallucinate, loose identity and don't know who I am   Insulin Aspart Other (See Comments)    Unknown   Lisinopril Other (See Comments)    nose bleed   Metformin And Related Nausea And Vomiting   Morphine Other (See Comments)     Can not function, it immobilizes me    Pentazocine Nausea And Vomiting   Pravastatin Other (See Comments)    Hands locked up   Repatha [Evolocumab] Other (See Comments)    myalgias   Statins Other (See Comments)    Muscle cramps  Sulfa Antibiotics Swelling   Sulfonamide Derivatives Swelling   Tramadol Hcl Other (See Comments)    Dizziness, cant function     FAMILY HISTORY: Family History  Problem Relation Age of Onset   Heart disease Maternal Grandfather    Heart failure Maternal Grandfather    Diabetes Maternal Grandfather    Heart attack Father    Diabetes Mother    Rheum arthritis Sister    Emphysema Paternal Uncle    Esophageal cancer Brother 79       she said he was born with it   Emphysema Paternal Aunt    Healthy Child    Neuropathy Neg Hx    Multiple sclerosis Neg Hx    Colon cancer Neg Hx    Colon polyps Neg Hx    Rectal cancer Neg Hx    Stomach cancer Neg Hx       Objective:  *** General: No acute distress.  Patient appears ***-groomed.   Head:  Normocephalic/atraumatic Eyes:  Fundi examined but not visualized Neck: supple, no paraspinal tenderness, full range of motion Heart:  Regular rate and rhythm Lungs:  Clear to auscultation bilaterally Back: No paraspinal tenderness Neurological Exam: alert and oriented to person, place, and time.  Speech fluent and not dysarthric, language intact.  CN II-XII intact. Bulk and tone normal, muscle strength 5/5 throughout.  Sensation to light touch intact.  Deep tendon reflexes 2+ throughout, toes downgoing.  Finger to nose testing intact.  Gait normal, Romberg negative.   Metta Clines, DO  CC: ***

## 2022-02-03 ENCOUNTER — Other Ambulatory Visit (INDEPENDENT_AMBULATORY_CARE_PROVIDER_SITE_OTHER): Payer: Medicare Other

## 2022-02-03 ENCOUNTER — Ambulatory Visit (INDEPENDENT_AMBULATORY_CARE_PROVIDER_SITE_OTHER): Payer: Medicare Other | Admitting: Neurology

## 2022-02-03 ENCOUNTER — Encounter: Payer: Self-pay | Admitting: Neurology

## 2022-02-03 VITALS — BP 125/70 | HR 68 | Ht 62.0 in | Wt 166.4 lb

## 2022-02-03 DIAGNOSIS — R519 Headache, unspecified: Secondary | ICD-10-CM

## 2022-02-03 LAB — COMPREHENSIVE METABOLIC PANEL
ALT: 9 U/L (ref 0–35)
AST: 16 U/L (ref 0–37)
Albumin: 4.3 g/dL (ref 3.5–5.2)
Alkaline Phosphatase: 93 U/L (ref 39–117)
BUN: 15 mg/dL (ref 6–23)
CO2: 31 mEq/L (ref 19–32)
Calcium: 10 mg/dL (ref 8.4–10.5)
Chloride: 104 mEq/L (ref 96–112)
Creatinine, Ser: 0.8 mg/dL (ref 0.40–1.20)
GFR: 68.17 mL/min (ref >60.00)
Glucose, Bld: 184 mg/dL — ABNORMAL HIGH (ref 70–99)
Potassium: 4.1 mEq/L (ref 3.5–5.1)
Sodium: 141 mEq/L (ref 135–145)
Total Bilirubin: 0.6 mg/dL (ref 0.2–1.2)
Total Protein: 7.6 g/dL (ref 6.0–8.3)

## 2022-02-03 LAB — CBC
HCT: 39.5 % (ref 36.0–46.0)
Hemoglobin: 12.9 g/dL (ref 12.0–15.0)
MCHC: 32.6 g/dL (ref 30.0–36.0)
MCV: 84.1 fl (ref 78.0–100.0)
Platelets: 254 10*3/uL (ref 150.0–400.0)
RBC: 4.69 Mil/uL (ref 3.87–5.11)
RDW: 15.3 % (ref 11.5–15.5)
WBC: 7 10*3/uL (ref 4.0–10.5)

## 2022-02-03 MED ORDER — DIVALPROEX SODIUM ER 250 MG PO TB24: 250.0000 mg | ORAL_TABLET | Freq: Every day | ORAL | 5 refills | Status: DC

## 2022-02-03 NOTE — Patient Instructions (Signed)
Stop topiramate Start divalproex '250mg'$  at bedtime.  We can increase dose in 2 months if needed Check CBC and CMP today and again in one month Follow up 4 to 5 months.

## 2022-02-15 ENCOUNTER — Encounter: Payer: Self-pay | Admitting: Pulmonary Disease

## 2022-02-15 ENCOUNTER — Ambulatory Visit (INDEPENDENT_AMBULATORY_CARE_PROVIDER_SITE_OTHER): Payer: Medicare Other

## 2022-02-15 ENCOUNTER — Ambulatory Visit (INDEPENDENT_AMBULATORY_CARE_PROVIDER_SITE_OTHER): Payer: Medicare Other | Admitting: Pulmonary Disease

## 2022-02-15 VITALS — BP 116/64 | HR 68 | Temp 98.1°F | Ht 62.0 in | Wt 165.6 lb

## 2022-02-15 DIAGNOSIS — R059 Cough, unspecified: Secondary | ICD-10-CM

## 2022-02-15 DIAGNOSIS — J4489 Other specified chronic obstructive pulmonary disease: Secondary | ICD-10-CM | POA: Diagnosis not present

## 2022-02-15 MED ORDER — AZITHROMYCIN 250 MG PO TABS: ORAL_TABLET | ORAL | 0 refills | Status: DC

## 2022-02-15 MED ORDER — PREDNISONE 20 MG PO TABS: ORAL_TABLET | ORAL | 0 refills | Status: DC

## 2022-02-15 NOTE — Progress Notes (Unsigned)
[name]    [id number]    Mar 07, 1940  Primary Care Physician:Burns, Claudina Lick, MD  Referring Physician: Binnie Rail, MD [place],  [place] [address]  Chief complaint: Follow-up for COPD, asthma, OSA  HPI: [name] has history of COPD, asthma overlap syndrome, GERD, OSA.  Previously followed by Dr. Ashok Cordia and Dr. Shelbie Hutching on Advair, Spiriva.  Has daily symptoms of dyspnea.  Using albuterol every day No nighttime awakenings.  Reports symptom of GERD.  Declined referral to GI Has history of sleep apnea. CPAP machine was discontinued as patient was noncompliant although she claims that she had been using it regularly.  Follow-up sleep studies in 2018 was nondiagnostic as she slept 20 4/2 now. Home sleep study in 2019 did not demonstrate OSA but there is nocturnal hypoxemia.  Patient was prescribed O2 at night but is not using it regularly More recently seen by neurology for headaches which is thought to be secondary to sleep apnea.  She has started using the oxygen at night for the past week.  Pets: No pets Occupation: Retired Education officer, museum from [employer] Exposures: No known exposures.  No mold, hot tub, Jacuzzi, no down pillows or comforters. Smoking history: 10-pack-year smoker.  Quit around 1990 Travel history: No significant travel history Relevant family history: No significant family history of lung disease  Act score 11/03/2018-9  Interim history: Complains of increasing cough, congestion for the past few months.  She received a course of prednisone in Nov with temporary improvement.  Continues on Trelegy inhaler  Hospitalized in November 2023 after syncope due to hypoglycemia.  Hospital records reviewed.   Outpatient Encounter Medications as of 02/15/2022  Medication Sig   acetaminophen (TYLENOL) 325 MG tablet Take 650 mg by mouth every 6 (six) hours as needed for moderate pain or headache.   albuterol (PROVENTIL  HFA;VENTOLIN HFA) 108 (90 BASE) MCG/ACT inhaler Inhale 2 puffs into the lungs every 6 (six) hours as needed. For shortness of breath.   albuterol (PROVENTIL) (2.5 MG/3ML) 0.083% nebulizer solution USE 1 VIAL IN NEBULIZER 4 TIMES DAILY (Patient taking differently: Take 2.5 mg by nebulization every 6 (six) hours as needed for wheezing or shortness of breath.)   aspirin EC 81 MG tablet Take 1 tablet (81 mg total) by mouth daily.   baclofen (LIORESAL) 20 MG tablet TAKE 2 TABLETS BY MOUTH 2 TIMES DAILY. (Patient taking differently: Take 40 mg by mouth 2 (two) times daily.)   Bempedoic Acid-Ezetimibe (NEXLIZET) 180-10 MG TABS Take 1 tablet by mouth daily.   calcium carbonate (OS-CAL) 1250 (500 Ca) MG chewable tablet Chew 500 mg by mouth daily as needed for heartburn.   Cholecalciferol (VITAMIN D) 50 MCG (2000 UT) tablet Take 4,000 Units by mouth daily.   Continuous Blood Gluc Receiver (FREESTYLE LIBRE 2 READER) DEVI 1 Device by Does not apply route every 14 (fourteen) days.   Continuous Blood Gluc Sensor (FREESTYLE LIBRE 2 SENSOR) MISC 1 Device by Does not apply route every 14 (fourteen) days.   Cyanocobalamin (VITAMIN B-12 IJ) Inject 1,000 mcg as directed every 30 (thirty) days.   diltiazem (CARDIZEM CD) 240 MG 24 hr capsule Take 1 capsule (240 mg total) by mouth daily.   divalproex (DEPAKOTE ER) 250 MG 24 hr tablet Take 1 tablet (250 mg total) by mouth at bedtime.   Fluticasone-Umeclidin-Vilant (TRELEGY ELLIPTA) 200-62.5-25 MCG/ACT AEPB Inhale 1 puff into the lungs daily at 6 (six) AM.  glucose blood test strip Use as instructed to test blood sugars up to 4 times daily   insulin lispro protamine-lispro (HUMALOG 75/25 MIX) (75-25) 100 UNIT/ML SUSP injection Inject 24 Units into the skin 2 (two) times daily with a meal. 10 units at lunch depending on bs reading (Patient taking differently: Inject 24 Units into the skin 2 (two) times daily with a meal.)   Insulin Syringe-Needle U-100 (INSULIN SYRINGE  .3CC/29GX1") 29G X 1" 0.3 ML MISC 1 Device by Does not apply route in the morning and at bedtime.   isosorbide mononitrate (IMDUR) 60 MG 24 hr tablet Take one tablet ( 60 mg ) twice daily 8 hours apart. (Patient taking differently: 60 mg 2 (two) times daily. Take one tablet ( 60 mg ) twice daily 8 hours apart.)   meclizine (ANTIVERT) 25 MG tablet Take 25 mg by mouth 2 (two) times daily as needed for dizziness.   metoprolol succinate (TOPROL-XL) 100 MG 24 hr tablet TAKE 1 TABLET BY MOUTH EVERY DAY (Patient taking differently: Take 100 mg by mouth daily with supper.)   Multiple Vitamin (MULTIVITAMIN WITH MINERALS) TABS Take 1 tablet by mouth daily.   nitroGLYCERIN (NITROSTAT) 0.4 MG SL tablet Place 1 tablet (0.4 mg total) under the tongue every 5 (five) minutes as needed for chest pain.   pantoprazole (PROTONIX) 40 MG tablet Take 1 tablet (40 mg total) by mouth daily.   spironolactone (ALDACTONE) 25 MG tablet TAKE 1 TABLET BY MOUTH EVERY DAY (Patient taking differently: Take 25 mg by mouth every evening.)   Study - ORION 4 - inclisiran 300 mg/1.43m or placebo SQ injection (PI-Stuckey) Inject 300 mg into the skin every 6 (six) months.   temazepam (RESTORIL) 30 MG capsule Take 30 mg by mouth daily.   furosemide (LASIX) 40 MG tablet Take 1 tablet (40 mg total) by mouth daily.   Facility-Administered Encounter Medications as of 02/15/2022  Medication   cyanocobalamin ((VITAMIN B-12)) injection 1,000 mcg   Study - ORION 4 - inclisiran 300 mg/1.521mor placebo SQ injection (PI-Stuckey)   Physical Exam: Blood pressure 116/64, pulse 68, temperature 98.1 F (36.7 C), temperature source Oral, height '5\' 2"'$  (1.575 m), weight 165 lb 9.6 oz (75.1 kg), SpO2 98 %. Gen:      No acute distress HEENT:  EOMI, sclera anicteric Neck:     No masses; no thyromegaly Lungs:    Clear to auscultation bilaterally; normal respiratory effort CV:         Regular rate and rhythm; no murmurs Abd:      + bowel sounds; soft,  non-tender; no palpable masses, no distension Ext:    No edema; adequate peripheral perfusion Skin:      Warm and dry; no rash Neuro: alert and oriented x 3 Psych: normal mood and affect   Data Reviewed: Imaging: CT chest 12/20/2017- right lower lobe nodular opacity has resolved compatible with atelectasis.  Apical pulmonary nodule decreased in size.  Emphysema. 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  No acute pulmonary disease. Chest x-ray 03/08/2019- Mild interstitial markings with atelectasis. Chest x-ray 12/20/2021-lungs are clear with no acute cardiopulmonary process. I have reviewed the images personally.  PFTs: 06/04/15: FVC 1.57 L (77%) FEV1 1.01 L (64%) FEV1/FVC 0.65 FEF 25-75 0.54 L (39%) negative bronchodilator response 02/21/15: FVC 1.60 L (78%) FEV1 1.03 L (65%) FEV1/FVC 0.64 FEF 25-75 0.51 L (37%) positive bronchodilator response 11/20/14: FVC 1.44 L (70%) FEV1 0.91 L (57%) FEV1/FVC 0.63 FEF 25-75 0.47 L (33%) positive bronchodilator  response TLC 3.92 L (79%) RV 103% DLCO uncorrected 64% 11/12/13: FVC 1.57 L (74%) FEV1 1.09 L (67%) FEV1/FVC 0.70 FEF 25-75 0.72 L (50%) negative bronchodilator response 08/04/11: FVC 1.61 L (60%) FEV1 0.89 L (48%) FEV1/FVC 0.56 FEF 25-75 0.29 L (14%) positive bronchodilator response TLC 3.67 L (80%) RV 103% ERV 46% DLCO corrected 38%  Labs: CBC 10/20/2017-WBC 6, eos 2.6%, absolute eosinophil count 156 CBC 02/12/2019-WBC 7.1, eos 1.8%, absolute eosinophil count 128  Sleep Home sleep study 03/29/2017- AHI 1.8, desats to 78% PSG 10/07/2016- AHI 6.8.  Total sleep time 40.5 minutes. In lab sleep study 11/20/2018-mild sleep apnea AHI 13.3, low O2 sat of 81%. Home sleep study 11/27/2021-no significant sleep apnea with AHI at 3.4/hour and SPO2 low at 83% with average O2 saturations at 92%.   Cardiac: Echocardiogram 11/17/2021-LVEF greater than 75%, normal RV systolic function.  TR signal is inadequate for measuring PA pressure.  Assessment:  Severe  persistent asthma, acute bronchitis, cough Continue Trelegy inhaler Get x-ray today Prescribe Z-Pak and prednisone for 5 days Resume Protonix once daily for 2 weeks for silent reflux  Nocturnal hypoxia Home sleep study reviewed with no significant sleep apnea.  She does have low O2 sat of 83% and was previously on nocturnal oxygen. Does not want to go back on O2   Plan/Recommendations: X-ray Z-Pak, prednisone Continue Trelegy inhaler  Marshell Garfinkel MD [facility] 02/15/2022, 9:37 AM  CC: Binnie Rail, MD

## 2022-02-15 NOTE — Patient Instructions (Signed)
Will get a chest x-ray today Prescribe Z-Pak and prednisone 40 mg a day for 5 days Start using the Protonix daily for acid reflux Return in 2 to 4 weeks with either Dr. Vaughan Browner or nurse practitioner

## 2022-02-16 ENCOUNTER — Ambulatory Visit: Payer: Medicare Other | Admitting: Internal Medicine

## 2022-02-18 ENCOUNTER — Encounter: Payer: Self-pay | Admitting: Pulmonary Disease

## 2022-03-05 ENCOUNTER — Ambulatory Visit (INDEPENDENT_AMBULATORY_CARE_PROVIDER_SITE_OTHER): Payer: Medicare Other | Admitting: Pulmonary Disease

## 2022-03-05 ENCOUNTER — Encounter: Payer: Self-pay | Admitting: Pulmonary Disease

## 2022-03-05 VITALS — BP 108/62 | HR 78 | Temp 98.3°F | Ht 62.0 in | Wt 163.2 lb

## 2022-03-05 DIAGNOSIS — J4489 Other specified chronic obstructive pulmonary disease: Secondary | ICD-10-CM

## 2022-03-05 DIAGNOSIS — R059 Cough, unspecified: Secondary | ICD-10-CM | POA: Diagnosis not present

## 2022-03-05 MED ORDER — FLUTICASONE PROPIONATE 50 MCG/ACT NA SUSP: 2.0000 | Freq: Every day | NASAL | 2 refills | Status: DC

## 2022-03-05 MED ORDER — BENZONATATE 200 MG PO CAPS: 200.0000 mg | ORAL_CAPSULE | Freq: Three times a day (TID) | ORAL | 0 refills | Status: DC | PRN

## 2022-03-05 NOTE — Progress Notes (Signed)
[name]    [id number]    1940/09/22  Primary Care Physician:Burns, Claudina Lick, MD  Referring Physician: Binnie Rail, MD [place],  [place] [id number]  Chief complaint: Follow-up for COPD, asthma, OSA  HPI: [name] has history of COPD, asthma overlap syndrome, GERD, OSA.  Previously followed by Dr. Ashok Cordia and Dr. Shelbie Hutching on Advair, Spiriva.  Has daily symptoms of dyspnea.  Using albuterol every day No nighttime awakenings.  Reports symptom of GERD.  Declined referral to GI Has history of sleep apnea. CPAP machine was discontinued as patient was noncompliant although she claims that she had been using it regularly.  Follow-up sleep studies in 2018 was nondiagnostic as she slept 20 4/2 now. Home sleep study in 2019 did not demonstrate OSA but there is nocturnal hypoxemia.  Patient was prescribed O2 at night but is not using it regularly More recently seen by neurology for headaches which is thought to be secondary to sleep apnea.  She has started using the oxygen at night for the past week.  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  No mold, hot tub, Jacuzzi, no down pillows or comforters. Smoking history: 10-pack-year smoker.  Quit around 1990 Travel history: No significant travel history Relevant family history: No significant family history of lung disease  ACT score 11/03/2018-9  Interim history: She was seen in early January 2024 for acute bronchitis, cough Chest x-ray at that time did not show any significant abnormalities.  She was given Z-Pak and prednisone with no significant improvement in symptoms  Her chief complaint today is sinus congestion, postnasal drip, sinus heaviness and she still continues to have some cough and dyspnea.  Outpatient Encounter Medications as of 03/05/2022  Medication Sig   acetaminophen (TYLENOL) 325 MG tablet Take 650 mg by mouth every 6  (six) hours as needed for moderate pain or headache.   albuterol (PROVENTIL HFA;VENTOLIN HFA) 108 (90 BASE) MCG/ACT inhaler Inhale 2 puffs into the lungs every 6 (six) hours as needed. For shortness of breath.   albuterol (PROVENTIL) (2.5 MG/3ML) 0.083% nebulizer solution USE 1 VIAL IN NEBULIZER 4 TIMES DAILY (Patient taking differently: Take 2.5 mg by nebulization every 6 (six) hours as needed for wheezing or shortness of breath.)   aspirin EC 81 MG tablet Take 1 tablet (81 mg total) by mouth daily.   baclofen (LIORESAL) 20 MG tablet TAKE 2 TABLETS BY MOUTH 2 TIMES DAILY. (Patient taking differently: Take 40 mg by mouth 2 (two) times daily.)   Bempedoic Acid-Ezetimibe (NEXLIZET) 180-10 MG TABS Take 1 tablet by mouth daily.   calcium carbonate (OS-CAL) 1250 (500 Ca) MG chewable tablet Chew 500 mg by mouth daily as needed for heartburn.   Cholecalciferol (VITAMIN D) 50 MCG (2000 UT) tablet Take 4,000 Units by mouth daily.   Continuous Blood Gluc Receiver (FREESTYLE LIBRE 2 READER) DEVI 1 Device by Does not apply route every 14 (fourteen) days.   Continuous Blood Gluc Sensor (FREESTYLE LIBRE 2 SENSOR) MISC 1 Device by Does not apply route every 14 (fourteen) days.   Cyanocobalamin (VITAMIN B-12 IJ) Inject 1,000 mcg as directed every 30 (thirty) days.   diltiazem (CARDIZEM CD) 240 MG 24 hr capsule Take 1 capsule (240 mg total) by mouth daily.   divalproex (DEPAKOTE ER) 250 MG 24 hr tablet Take 1 tablet (250 mg total) by mouth at bedtime.   Fluticasone-Umeclidin-Vilant (TRELEGY  ELLIPTA) 200-62.5-25 MCG/ACT AEPB Inhale 1 puff into the lungs daily at 6 (six) AM.   glucose blood test strip Use as instructed to test blood sugars up to 4 times daily   insulin lispro protamine-lispro (HUMALOG 75/25 MIX) (75-25) 100 UNIT/ML SUSP injection Inject 24 Units into the skin 2 (two) times daily with a meal. 10 units at lunch depending on bs reading (Patient taking differently: Inject 24 Units into the skin 2 (two)  times daily with a meal.)   Insulin Syringe-Needle U-100 (INSULIN SYRINGE .3CC/29GX1") 29G X 1" 0.3 ML MISC 1 Device by Does not apply route in the morning and at bedtime.   isosorbide mononitrate (IMDUR) 60 MG 24 hr tablet Take one tablet ( 60 mg ) twice daily 8 hours apart. (Patient taking differently: 60 mg 2 (two) times daily. Take one tablet ( 60 mg ) twice daily 8 hours apart.)   meclizine (ANTIVERT) 25 MG tablet Take 25 mg by mouth 2 (two) times daily as needed for dizziness.   metoprolol succinate (TOPROL-XL) 100 MG 24 hr tablet TAKE 1 TABLET BY MOUTH EVERY DAY (Patient taking differently: Take 100 mg by mouth daily with supper.)   Multiple Vitamin (MULTIVITAMIN WITH MINERALS) TABS Take 1 tablet by mouth daily.   nitroGLYCERIN (NITROSTAT) 0.4 MG SL tablet Place 1 tablet (0.4 mg total) under the tongue every 5 (five) minutes as needed for chest pain.   pantoprazole (PROTONIX) 40 MG tablet Take 1 tablet (40 mg total) by mouth daily.   spironolactone (ALDACTONE) 25 MG tablet TAKE 1 TABLET BY MOUTH EVERY DAY (Patient taking differently: Take 25 mg by mouth every evening.)   furosemide (LASIX) 40 MG tablet Take 1 tablet (40 mg total) by mouth daily.   Study - ORION 4 - inclisiran 300 mg/1.36m or placebo SQ injection (PI-Stuckey) Inject 300 mg into the skin every 6 (six) months.   temazepam (RESTORIL) 30 MG capsule Take 30 mg by mouth daily.   [DISCONTINUED] azithromycin (ZITHROMAX) 250 MG tablet Take as directed   [DISCONTINUED] predniSONE (DELTASONE) 20 MG tablet Take '40mg'$  for 5 days   Facility-Administered Encounter Medications as of 03/05/2022  Medication   cyanocobalamin ((VITAMIN B-12)) injection 1,000 mcg   Study - ORION 4 - inclisiran 300 mg/1.529mor placebo SQ injection (PI-Stuckey)   Physical Exam: Blood pressure 108/62, pulse 78, temperature 98.3 F (36.8 C), temperature source Oral, height '5\' 2"'$  (1.575 m), weight 163 lb 3.2 oz (74 kg), SpO2 100 %. Gen:      No acute  distress HEENT:  EOMI, sclera anicteric Neck:     No masses; no thyromegaly Lungs:    Clear to auscultation bilaterally; normal respiratory effort CV:         Regular rate and rhythm; no murmurs Abd:      + bowel sounds; soft, non-tender; no palpable masses, no distension Ext:    No edema; adequate peripheral perfusion Skin:      Warm and dry; no rash Neuro: alert and oriented x 3 Psych: normal mood and affect  Data Reviewed: Imaging: CT chest 12/20/2017- right lower lobe nodular opacity has resolved compatible with atelectasis.  Apical pulmonary nodule decreased in size.  Emphysema. 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  No acute pulmonary disease. Chest x-ray 03/08/2019- Mild interstitial markings with atelectasis. Chest x-ray 12/20/2021-lungs are clear with no acute cardiopulmonary process. Chest x-ray 02/15/2022-no active cardiopulmonary disease. I have reviewed the images personally.  PFTs: 06/04/15: FVC 1.57 L (77%) FEV1 1.01 L (64%) FEV1/FVC  0.65 FEF 25-75 0.54 L (39%) negative bronchodilator response 02/21/15: FVC 1.60 L (78%) FEV1 1.03 L (65%) FEV1/FVC 0.64 FEF 25-75 0.51 L (37%) positive bronchodilator response 11/20/14: FVC 1.44 L (70%) FEV1 0.91 L (57%) FEV1/FVC 0.63 FEF 25-75 0.47 L (33%) positive bronchodilator response TLC 3.92 L (79%) RV 103% DLCO uncorrected 64% 11/12/13: FVC 1.57 L (74%) FEV1 1.09 L (67%) FEV1/FVC 0.70 FEF 25-75 0.72 L (50%) negative bronchodilator response 08/04/11: FVC 1.61 L (60%) FEV1 0.89 L (48%) FEV1/FVC 0.56 FEF 25-75 0.29 L (14%) positive bronchodilator response TLC 3.67 L (80%) RV 103% ERV 46% DLCO corrected 38%  Labs: CBC 10/20/2017-WBC 6, eos 2.6%, absolute eosinophil count 156 CBC 02/12/2019-WBC 7.1, eos 1.8%, absolute eosinophil count 128  Sleep Home sleep study 03/29/2017- AHI 1.8, desats to 78% PSG 10/07/2016- AHI 6.8.  Total sleep time 40.5 minutes. In lab sleep study 11/20/2018-mild sleep apnea AHI 13.3, low O2 sat of 81%. Home sleep study  11/27/2021-no significant sleep apnea with AHI at 3.4/hour and SPO2 low at 83% with average O2 saturations at 92%.   Cardiac: Echocardiogram 11/17/2021-LVEF greater than 75%, normal RV systolic function.  TR signal is inadequate for measuring PA pressure.  Assessment:  Severe persistent asthma, acute bronchitis, cough Acute sinusitis, postnasal drip Continue Trelegy inhaler Continues to be symptomatic in spite of Z-Pak and prednisone.  I do not believe she will require another round of antibiotics and steroids as she is not wheezing Her main complaint seems to be upper airway congestion with postnasal drip Will try Flonase, chlorphentermine antihistamine, Tessalon for cough Continue Protonix once daily for treatment of reflux.  Nocturnal hypoxia Home sleep study reviewed with no significant sleep apnea.  She does have low O2 sat of 83% and was previously on nocturnal oxygen. Does not want to go back on O2  Plan/Recommendations: Continue Trelegy inhaler Steroid nasal spray, antihistamine, Tessalon Protonix twice daily  Marshell Garfinkel MD  [facility] 03/05/2022, 11:30 AM  CC: Binnie Rail, MD

## 2022-03-05 NOTE — Patient Instructions (Addendum)
Sorry you are not feeling well I do not believe you will need additional antibiotics or steroids Will start Flonase spray, start chlorpheniramine 4 mg tablets 3 times a day for postnasal drip Use saline nasal spray over-the-counter Continue Protonix twice a day Continue Trelegy inhaler Prescribe Tessalon 200 mg 3 times a day as needed for cough Return to clinic in 1 month with either me or nurse practitioner.

## 2022-03-16 NOTE — H&P (Signed)
Patient's anticipated LOS is less than 2 midnights, meeting these requirements: - Younger than 9 - Lives within 1 hour of care - Has a competent adult at home to recover with post-op recover - NO history of  - Chronic pain requiring opiods  - Diabetes  - Coronary Artery Disease  - Heart failure  - Heart attack  - Stroke  - DVT/VTE  - Cardiac arrhythmia  - Respiratory Failure/COPD  - Renal failure  - Anemia  - Advanced Liver disease     [name] is an 82 y.o. female.    Chief Complaint: left shoulder pain  HPI: Pt is a 82 y.o. female complaining of left shoulder pain for multiple years. Pain had continually increased since the beginning. X-rays in the clinic show end-stage arthritic changes of the left shoulder. Pt has tried various conservative treatments which have failed to alleviate their symptoms, including injections and therapy. Various options are discussed with the patient. Risks, benefits and expectations were discussed with the patient. Patient understand the risks, benefits and expectations and wishes to proceed with surgery.   PCP:  Binnie Rail, MD  D/C Plans: Home  PMH: Past Medical History:  Diagnosis Date   Adenomatous colon polyp    Allergy    Anxiety    Asthma    Carotid artery disease ([place])    carotid US 02/2017: bilat ICA 1-39%   Chronic diastolic CHF    Echo 03/9526: EF 65-70, Gr 2 DD, mild MS (mean 5), PASP 44   COPD    pt is unsure if has been officially diagnosed   Coronary artery disease    CABG '09- cathed 12/09, 9/10, 6/11, 3/14 and 12/13/16- medical Rx // cath 12/2016 - 2/3 grafts patent >> med Rx // Myoview 12/17: low risk   Diabetes mellitus    Gastroesophageal reflux disease    Hiatal hernia    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   Hyperlipidemia    Hypertension    PONV (postoperative nausea and vomiting)    Schatzki's ring    Shoulder injury    resolved after shoulder surgery   Sleep apnea    not on cpap     PSH: Past Surgical History:  Procedure Laterality Date   ABDOMINAL HYSTERECTOMY     APPENDECTOMY     came out with Hysterectomy   CARDIAC CATHETERIZATION  07/23/2009   EF 60%   CARDIAC CATHETERIZATION  10/11/2008   CARDIAC CATHETERIZATION  03/01/2007   EF 75-80%   CARDIOVASCULAR STRESS TEST  11/15/2007   EF 60%   COLONOSCOPY     CORONARY ARTERY BYPASS GRAFT     SEVERELY DISEASED SAPHENOUS VEIN GRAFT TO THE RIGHT CORONARY ARTERY BUT WITH FAIRLY WELL PRESERVED FLOW TO THE DISTAL RIGHT CORONARY ARTERY FROM THE NATIVE CIRCULATION-RESTART  CATH IN JUNE 2000, REVEALS MILD/MODERATE  CAD WITH GOOD FLOW DOWN HER LAD   ESOPHAGOGASTRODUODENOSCOPY     EYE SURGERY     bilateral cataract surgery with lens implant   LEFT HEART CATH AND CORS/GRAFTS ANGIOGRAPHY N/A 12/14/2016   Procedure: LEFT HEART CATH AND CORS/GRAFTS ANGIOGRAPHY;  Surgeon: Jettie Booze, MD;  Location: [facility];  Service: Cardiovascular;  Laterality: N/A;   lense removal Left    POLYPECTOMY     RIGHT/LEFT HEART CATH AND CORONARY/GRAFT ANGIOGRAPHY N/A 07/28/2021   Procedure: RIGHT/LEFT HEART CATH AND CORONARY/GRAFT ANGIOGRAPHY;  Surgeon: Troy Sine, MD;  Location: [facility];  Service: Cardiovascular;  Laterality: N/A;   ROTATOR CUFF REPAIR     right and left   TONSILLECTOMY     age 17   TOTAL KNEE ARTHROPLASTY Right 02/20/2014   Procedure: RIGHT TOTAL KNEE ARTHROPLASTY;  Surgeon: Tobi Bastos, MD;  Location: [facility];  Service: Orthopedics;  Laterality: Right;   tumor removed kidney     UPPER GASTROINTESTINAL ENDOSCOPY     US ECHOCARDIOGRAPHY  03/08/2008   EF 55-60%    Social History:  reports that she quit smoking about 46 years ago. Her smoking use included cigarettes. She started smoking about 67 years ago. She has a 10.50 pack-year smoking history. She has never used smokeless tobacco. She reports that she does not drink alcohol and does not use drugs. BMI: Estimated body mass index is 29.85  kg/m as calculated from the following:   Height as of 03/05/22: '5\' 2"'$  (1.575 m).   Weight as of 03/05/22: 74 kg.  Lab Results  Component Value Date   ALBUMIN 4.3 02/03/2022   Diabetes:   Patient has a diagnosis of diabetes,  Lab Results  Component Value Date   HGBA1C 7.1 (A) 12/28/2021   HGBA1C 7.1 12/28/2021   HGBA1C 7.1 (A) 12/28/2021   HGBA1C 7.1 (A) 12/28/2021   Smoking Status:      Allergies:  Allergies  Allergen Reactions   Amlodipine Other (See Comments)    hallucinations    Banana Nausea And Vomiting    Stomach pumped   Co Q10 [Coenzyme Q10] Other (See Comments)    Body cramps   Codeine Other (See Comments)    Hallucinate, loose identity and don't know who I am   Insulin Aspart Other (See Comments)    Unknown   Lisinopril Other (See Comments)    nose bleed   Metformin And Related Nausea And Vomiting   Morphine Other (See Comments)    Can not function, it immobilizes me    Pentazocine Nausea And Vomiting   Pravastatin Other (See Comments)    Hands locked up   Repatha [Evolocumab] Other (See Comments)    myalgias   Statins Other (See Comments)    Muscle cramps   Sulfa Antibiotics Swelling   Sulfonamide Derivatives Swelling   Tramadol Hcl Other (See Comments)    Dizziness, cant function     Medications: Current Facility-Administered Medications  Medication Dose Route Frequency Provider Last Rate Last Admin   cyanocobalamin ((VITAMIN B-12)) injection 1,000 mcg  1,000 mcg Intramuscular Q30 days Binnie Rail, MD   1,000 mcg at 03/26/16 1658   Study - ORION 4 - inclisiran 300 mg/1.86m or placebo SQ injection (PI-Stuckey)  300 mg Subcutaneous Q6 months SHillary Bow MD   300 mg at 12/23/21 1557   Current Outpatient Medications  Medication Sig Dispense Refill   acetaminophen (TYLENOL) 325 MG tablet Take 650 mg by mouth every 6 (six) hours as needed for moderate pain or headache.     albuterol (PROVENTIL HFA;VENTOLIN HFA) 108 (90 BASE) MCG/ACT inhaler  Inhale 2 puffs into the lungs every 6 (six) hours as needed. For shortness of breath. 1 Inhaler 0   albuterol (PROVENTIL) (2.5 MG/3ML) 0.083% nebulizer solution USE 1 VIAL IN NEBULIZER 4 TIMES DAILY (Patient taking differently: Take 2.5 mg by nebulization every 6 (six) hours as needed for wheezing or shortness of breath.) 360 mL 1   aspirin EC 81 MG tablet Take 1 tablet (81 mg total) by mouth daily.     baclofen (LIORESAL) 20  MG tablet TAKE 2 TABLETS BY MOUTH 2 TIMES DAILY. (Patient taking differently: Take 40 mg by mouth 2 (two) times daily.) 360 tablet 2   Bempedoic Acid-Ezetimibe (NEXLIZET) 180-10 MG TABS Take 1 tablet by mouth daily. 90 tablet 3   benzonatate (TESSALON) 200 MG capsule Take 1 capsule (200 mg total) by mouth 3 (three) times daily as needed for cough. 90 capsule 0   calcium carbonate (OS-CAL) 1250 (500 Ca) MG chewable tablet Chew 500 mg by mouth daily as needed for heartburn.     Cholecalciferol (VITAMIN D) 50 MCG (2000 UT) tablet Take 4,000 Units by mouth daily.     Continuous Blood Gluc Receiver (FREESTYLE LIBRE 2 READER) DEVI 1 Device by Does not apply route every 14 (fourteen) days. 1 each 3   Continuous Blood Gluc Sensor (FREESTYLE LIBRE 2 SENSOR) MISC 1 Device by Does not apply route every 14 (fourteen) days. 6 each 3   Cyanocobalamin (VITAMIN B-12 IJ) Inject 1,000 mcg as directed every 30 (thirty) days.     diltiazem (CARDIZEM CD) 240 MG 24 hr capsule Take 1 capsule (240 mg total) by mouth daily. 90 capsule 2   divalproex (DEPAKOTE ER) 250 MG 24 hr tablet Take 1 tablet (250 mg total) by mouth at bedtime. 30 tablet 5   fluticasone (FLONASE) 50 MCG/ACT nasal spray Place 2 sprays into both nostrils daily. 16 g 2   Fluticasone-Umeclidin-Vilant (TRELEGY ELLIPTA) 200-62.5-25 MCG/ACT AEPB Inhale 1 puff into the lungs daily at 6 (six) AM. 1 each 0   furosemide (LASIX) 40 MG tablet Take 1 tablet (40 mg total) by mouth daily. 30 tablet    glucose blood test strip Use as instructed to  test blood sugars up to 4 times daily 200 each 12   insulin lispro protamine-lispro (HUMALOG 75/25 MIX) (75-25) 100 UNIT/ML SUSP injection Inject 24 Units into the skin 2 (two) times daily with a meal. 10 units at lunch depending on bs reading (Patient taking differently: Inject 24 Units into the skin 2 (two) times daily with a meal.) 50 mL 3   Insulin Syringe-Needle U-100 (INSULIN SYRINGE .3CC/29GX1") 29G X 1" 0.3 ML MISC 1 Device by Does not apply route in the morning and at bedtime. 200 each 3   isosorbide mononitrate (IMDUR) 60 MG 24 hr tablet Take one tablet ( 60 mg ) twice daily 8 hours apart. (Patient taking differently: 60 mg 2 (two) times daily. Take one tablet ( 60 mg ) twice daily 8 hours apart.) 180 tablet 3   meclizine (ANTIVERT) 25 MG tablet Take 25 mg by mouth 2 (two) times daily as needed for dizziness.     metoprolol succinate (TOPROL-XL) 100 MG 24 hr tablet TAKE 1 TABLET BY MOUTH EVERY DAY (Patient taking differently: Take 100 mg by mouth daily with supper.) 90 tablet 3   Multiple Vitamin (MULTIVITAMIN WITH MINERALS) TABS Take 1 tablet by mouth daily.     nitroGLYCERIN (NITROSTAT) 0.4 MG SL tablet Place 1 tablet (0.4 mg total) under the tongue every 5 (five) minutes as needed for chest pain. 25 tablet 3   pantoprazole (PROTONIX) 40 MG tablet Take 1 tablet (40 mg total) by mouth daily. 30 tablet 0   spironolactone (ALDACTONE) 25 MG tablet TAKE 1 TABLET BY MOUTH EVERY DAY (Patient taking differently: Take 25 mg by mouth every evening.) 90 tablet 3   Study - ORION 4 - inclisiran 300 mg/1.45m or placebo SQ injection (PI-Stuckey) Inject 300 mg into the skin every 6 (six) months.  temazepam (RESTORIL) 30 MG capsule Take 30 mg by mouth daily.      No results found. However, due to the size of the patient record, not all encounters were searched. Please check Results Review for a complete set of results. No results found.  ROS: Pain with rom of the left upper extremity  Physical  Exam: Alert and oriented 82 y.o. female in no acute distress Cranial nerves 2-12 intact Cervical spine: full rom with no tenderness, nv intact distally Chest: active breath sounds bilaterally, no wheeze rhonchi or rales Heart: regular rate and rhythm, no murmur Abd: non tender non distended with active bowel sounds Hip is stable with rom  Left shoulder painful and weak rom Nv intact distally No rashes or edema  Assessment/Plan Assessment: left shoulder cuff arthropathy  Plan:  Patient will undergo a left reverse total shoulder by Dr. Veverly Fells at [facility] Risks benefits and expectations were discussed with the patient. Patient understand risks, benefits and expectations and wishes to proceed. Preoperative templating of the joint replacement has been completed, documented, and submitted to the Operating Room personnel in order to optimize intra-operative equipment management.   Merla Riches PA-C, MPAS [facility] is now [employer] [address], [place], [place], [place] [address] Phone: [phone] [email or web address] Facebook  [employer]

## 2022-03-17 ENCOUNTER — Other Ambulatory Visit: Payer: Self-pay | Admitting: Neurology

## 2022-03-17 ENCOUNTER — Other Ambulatory Visit: Payer: Self-pay | Admitting: Pulmonary Disease

## 2022-03-29 NOTE — Progress Notes (Addendum)
Anesthesia Review:  PCP: Billey Gosling- LOV 12/28/21  Cardiologist : DR Mertie Moores- LOV 10/30/21  Pulmonology- Dr Hart Robinsons Mannam LOV 03/05/22  Neurology- DR Tomi Likens [place] 02/03/22  Chest x-ray : 02/15/22- 2 view  EKG  12/21/21  and 03/31/22  Echo : 11/17/21  Stress test: 02/20/20  Cardiac Cath :  07/28/21  Activity level: cannot do a flight of stairs without difficutly per pt  Sleep Study/ CPAP : not on cpap  Fasting Blood Sugar :      / Checks Blood Sugar -- times a day:   Blood Thinner/ Instructions /Last Dose: ASA / Instructions/ Last Dose :    DM- type 1 per pt - Freestyle Libre- currently out of supplies checking glucose by finger stock 4 times daily per pt  Hgba1c-03/31/22-  6.5    PT currenlty on Orion in study.  Instructed to contact Research in regards to surgery and to see what she needs to do with study medication.  PT voiced understanding.  PT a type 1 DM on Humalog 75/25.  Per diabetic protocol it states pt should notify endocrinologist in regards to instructions for preop dosing .  PT given instructions to take 10 units of Humalog 75/25 at dinner  day before surgery.  PT states she does not take Humalog 75/25 if she does not eat and was instructed to take none on day of surgery.  Again, pt was instructed to notify endocrinologist in regards to instructions above and to see if they wanted to prescribe anything different and to notify preop nurse as to what new preop instructions from MD are at [phone].    PT wwas 30 minutes late for preop appt. Reviewed med hx and preop instructions.   PT has very long fingernails.  Unable to obtain pulse ox at preop.  PT in no acute distress.  Denies any shortness of breath..  Instructed pt at preop to  cut back a fingernail on each hand prior to surgery so pulse ox could be obtained.  PT voiced understanding.  Janett Billow [facility] aware.  PT signed consent incorrectly will have resign dos.

## 2022-03-30 ENCOUNTER — Encounter: Payer: Self-pay | Admitting: Internal Medicine

## 2022-03-30 ENCOUNTER — Ambulatory Visit (INDEPENDENT_AMBULATORY_CARE_PROVIDER_SITE_OTHER): Payer: Medicare Other | Admitting: Internal Medicine

## 2022-03-30 VITALS — BP 140/88 | HR 72 | Ht 62.0 in | Wt 168.6 lb

## 2022-03-30 DIAGNOSIS — Z23 Encounter for immunization: Secondary | ICD-10-CM | POA: Diagnosis not present

## 2022-03-30 DIAGNOSIS — E1059 Type 1 diabetes mellitus with other circulatory complications: Secondary | ICD-10-CM

## 2022-03-30 LAB — POCT GLYCOSYLATED HEMOGLOBIN (HGB A1C): Hemoglobin A1C: 6.6 % — AB (ref 4.0–5.6)

## 2022-03-30 LAB — POCT GLUCOSE (DEVICE FOR HOME USE): Glucose Fasting, POC: 190 mg/dL — AB (ref 70–99)

## 2022-03-30 MED ORDER — INSULIN LISPRO PROT & LISPRO (75-25 MIX) 100 UNIT/ML ~~LOC~~ SUSP: 18.0000 [IU] | Freq: Two times a day (BID) | SUBCUTANEOUS | 3 refills | Status: DC

## 2022-03-30 NOTE — Patient Instructions (Addendum)
SURGICAL WAITING ROOM VISITATION  Patients having surgery or a procedure may have no more than 2 support people in the waiting area - these visitors may rotate.    Children under the age of 50 must have an adult with them who is not the patient.  Due to an increase in RSV and influenza rates and associated hospitalizations, children ages 71 and under may not visit patients in [place].  If the patient needs to stay at the hospital during part of their recovery, the visitor guidelines for inpatient rooms apply. Pre-op nurse will coordinate an appropriate time for 1 support person to accompany patient in pre-op.  This support person may not rotate.    Please refer to the [facility] website for the visitor guidelines for Inpatients (after your surgery is over and you are in a regular room).       Your procedure is scheduled on:  04/09/22    Report to [facility] Main Entrance    Report to admitting at  1215pm    Call this number if you have problems the morning of surgery [phone]   Do not eat food :After Midnight.   After Midnight you may have the following liquids until ___  1130___ AM  DAY OF SURGERY  Water Non-Citrus Juices (without pulp, NO RED-Apple, White grape, White cranberry) Black Coffee (NO MILK/CREAM OR CREAMERS, sugar ok)  Clear Tea (NO MILK/CREAM OR CREAMERS, sugar ok) regular and decaf                             Plain Jell-O (NO RED)                                           Fruit ices (not with fruit pulp, NO RED)                                     Popsicles (NO RED)                                                               Sports drinks like Gatorade (NO RED)                    The day of surgery:  Drink ONE (1) Pre-Surgery Clear Ensure or G2 at     1130 AM ( have completed by )  the morning of surgery. Drink in one sitting. Do not sip.  This drink was given to you during your hospital  pre-op appointment visit. Nothing else to  drink after completing the  Pre-Surgery Clear Ensure or G2.          If you have questions, please contact your surgeon's office.       Oral Hygiene is also important to reduce your risk of infection.                                    Remember - BRUSH YOUR TEETH  THE MORNING OF SURGERY WITH YOUR REGULAR TOOTHPASTE  DENTURES WILL BE REMOVED PRIOR TO SURGERY PLEASE DO NOT APPLY "Poly grip" OR ADHESIVES!!!   Do NOT smoke after Midnight   Take these medicines the morning of surgery with A SIP OF WATER:   Inhalers as usual and bring, nebulizer if needed, cardizem, flonase, imdur, protonix       DO NOT TAKE ANY ORAL DIABETIC MEDICATIONS DAY OF YOUR SURGERY  Bring CPAP mask and tubing day of surgery.                              You may not have any metal on your body including hair pins, jewelry, and body piercing             Do not wear make-up, lotions, powders, perfumes/cologne, or deodorant  Do not wear nail polish including gel and S&S, artificial/acrylic nails, or any other type of covering on natural nails including finger and toenails. If you have artificial nails, gel coating, etc. that needs to be removed by a nail salon please have this removed prior to surgery or surgery may need to be canceled/ delayed if the surgeon/ anesthesia feels like they are unable to be safely monitored.   Do not shave  48 hours prior to surgery.               Men may shave face and neck.   Do not bring valuables to the hospital. [place].   Contacts, glasses, dentures or bridgework may not be worn into surgery.   Bring small overnight bag day of surgery.   DO NOT [place]. PHARMACY WILL DISPENSE MEDICATIONS LISTED ON YOUR MEDICATION LIST TO YOU DURING YOUR ADMISSION [place]!    Patients discharged on the day of surgery will not be allowed to drive home.  Someone NEEDS to stay with you for the first  24 hours after anesthesia.   Special Instructions: Bring a copy of your healthcare power of attorney and living will documents the day of surgery if you haven't scanned them before.              Please read over the following fact sheets you were given: IF [place] [phone]   If you received a COVID test during your pre-op visit  it is requested that you wear a mask when out in public, stay away from anyone that may not be feeling well and notify your surgeon if you develop symptoms. If you test positive for Covid or have been in contact with anyone that has tested positive in the last 10 days please notify you surgeon.    [facility] - Preparing for Surgery Before surgery, you can play an important role.  Because skin is not sterile, your skin needs to be as free of germs as possible.  You can reduce the number of germs on your skin by washing with CHG (chlorahexidine gluconate) soap before surgery.  CHG is an antiseptic cleaner which kills germs and bonds with the skin to continue killing germs even after washing. Please DO NOT use if you have an allergy to CHG or antibacterial soaps.  If your skin becomes reddened/irritated stop using the CHG and inform your nurse when you arrive  at Short Stay. Do not shave (including legs and underarms) for at least 48 hours prior to the first CHG shower.  You may shave your face/neck. Please follow these instructions carefully:  1.  Shower with CHG Soap the night before surgery and the  morning of Surgery.  2.  If you choose to wash your hair, wash your hair first as usual with your  normal  shampoo.  3.  After you shampoo, rinse your hair and body thoroughly to remove the  shampoo.                           4.  Use CHG as you would any other liquid soap.  You can apply chg directly  to the skin and wash                       Gently with a scrungie or clean washcloth.  5.  Apply the CHG Soap to your body  ONLY FROM THE NECK DOWN.   Do not use on face/ open                           Wound or open sores. Avoid contact with eyes, ears mouth and genitals (private parts).                       Wash face,  Genitals (private parts) with your normal soap.             6.  Wash thoroughly, paying special attention to the area where your surgery  will be performed.  7.  Thoroughly rinse your body with warm water from the neck down.  8.  DO NOT shower/wash with your normal soap after using and rinsing off  the CHG Soap.                9.  Pat yourself dry with a clean towel.            10.  Wear clean pajamas.            11.  Place clean sheets on your bed the night of your first shower and do not  sleep with pets. Day of Surgery : Do not apply any lotions/deodorants the morning of surgery.  Please wear clean clothes to the hospital/surgery center.  FAILURE TO FOLLOW THESE INSTRUCTIONS MAY RESULT IN THE CANCELLATION OF YOUR SURGERY PATIENT SIGNATURE_________________________________  NURSE SIGNATURE__________________________________  ________________________________________________________________________[facility] - Preparing for Surgery Before surgery, you can play an important role.  Because skin is not sterile, your skin needs to be as free of germs as possible.  You can reduce the number of germs on your skin by washing with CHG (chlorahexidine gluconate) soap before surgery.  CHG is an antiseptic cleaner which kills germs and bonds with the skin to continue killing germs even after washing. Please DO NOT use if you have an allergy to CHG or antibacterial soaps.  If your skin becomes reddened/irritated stop using the CHG and inform your nurse when you arrive at Short Stay. Do not shave (including legs and underarms) for at least 48 hours prior to the first CHG shower.  You may shave your face/neck. Please follow these instructions carefully:  1.  Shower with CHG Soap the night before surgery and the   morning of Surgery.  2.  If you choose to wash your  hair, wash your hair first as usual with your  normal  shampoo.  3.  After you shampoo, rinse your hair and body thoroughly to remove the  shampoo.                           4.  Use CHG as you would any other liquid soap.  You can apply chg directly  to the skin and wash                       Gently with a scrungie or clean washcloth.  5.  Apply the CHG Soap to your body ONLY FROM THE NECK DOWN.   Do not use on face/ open                           Wound or open sores. Avoid contact with eyes, ears mouth and genitals (private parts).                       Wash face,  Genitals (private parts) with your normal soap.             6.  Wash thoroughly, paying special attention to the area where your surgery  will be performed.  7.  Thoroughly rinse your body with warm water from the neck down.  8.  DO NOT shower/wash with your normal soap after using and rinsing off  the CHG Soap.                9.  Pat yourself dry with a clean towel.            10.  Wear clean pajamas.            11.  Place clean sheets on your bed the night of your first shower and do not  sleep with pets. Day of Surgery : Do not apply any lotions/deodorants the morning of surgery.  Please wear clean clothes to the hospital/surgery center.  FAILURE TO FOLLOW THESE INSTRUCTIONS MAY RESULT IN THE CANCELLATION OF YOUR SURGERY PATIENT SIGNATURE_________________________________  NURSE SIGNATURE__________________________________  ________________________________________________________________________[facility]- Preparing for Total Shoulder Arthroplasty    Before surgery, you can play an important role. Because skin is not sterile, your skin needs to be as free of germs as possible. You can reduce the number of germs on your skin by using the following products. Benzoyl Peroxide Gel Reduces the number of germs present on the skin Applied twice a day to shoulder area starting two  days before surgery    ==================================================================  Please follow these instructions carefully:  BENZOYL PEROXIDE 5% GEL  Please do not use if you have an allergy to benzoyl peroxide.   If your skin becomes reddened/irritated stop using the benzoyl peroxide.  Starting two days before surgery, apply as follows: Apply benzoyl peroxide in the morning and at night. Apply after taking a shower. If you are not taking a shower clean entire shoulder front, back, and side along with the armpit with a clean wet washcloth.  Place a quarter-sized dollop on your shoulder and rub in thoroughly, making sure to cover the front, back, and side of your shoulder, along with the armpit.   2 days before ____ AM   ____ PM              1 day before ____ AM   ____ PM  Do this twice a day for two days.  (Last application is the night before surgery, AFTER using the CHG soap as described below).  Do NOT apply benzoyl peroxide gel on the day of surgery.

## 2022-03-30 NOTE — Progress Notes (Unsigned)
Name: [name]  MRN/ DOB: [id number], August 05, 1940   Age/ Sex: 82 y.o., female    PCP: Binnie Rail, MD   Reason for Endocrinology Evaluation: Type 1 Diabetes Mellitus     Date of Initial Endocrinology Visit: 12/18/2021    PATIENT IDENTIFIER: [name] is a 82 y.o. female with a past medical history of DM, CAD and OSA. The patient presented for initial endocrinology clinic visit on 12/18/2021  for consultative assistance with her diabetes management.    HPI: [name] was    Diagnosed with DM 1986 Prior Medications tried/Intolerance: Metformin- Vomiting  Hemoglobin A1c has ranged from 6.9% in 2009, peaking at 9.5% in 2023.   Used to follow-up with Dr. Chalmers Cater   On her initial visit to our clinic her A1c was 6.9%, she was taking Humalog Mix 3 times daily, this was adjusted to twice daily  SUBJECTIVE:   During the last visit (12/18/2021): A1c 6.9%  Today (03/30/22): [name] is here for a follow up on diabetes management. She checks her blood sugars 3-4 times daily through the freestyle libre, did not bring the receiver today. The patient has  had hypoglycemic episodes since the last clinic visit.   Patient had an ED admission for hypothermia and hypoglycemia 12/20/2021, pt tells me that she took 70 units based on my recommendations ????  I did show the pt that my recommendations never included 70 units but rather 24 BID instead  of 50 units with breakfast ,15 units with lunch and 20 units with Supper like she was doing before she arrived here   She follows with pulmonology for COPD    HOME DIABETES REGIMEN: Humalog mix 20 units With breakfast,  and 15-18 units with Supper       METER DOWNLOAD SUMMARY: n/a  DIABETIC COMPLICATIONS: Microvascular complications:   Denies: CKD, retinopathy Last eye exam: Completed 11/2021  Macrovascular complications:  CAD Denies:  PVD, CVA   PAST HISTORY: Past Medical History:  Past Medical History:   Diagnosis Date   Adenomatous colon polyp    Allergy    Anxiety    Asthma    Carotid artery disease ([place])    carotid US 02/2017: bilat ICA 1-39%   Chronic diastolic CHF    Echo 123456: EF 65-70, Gr 2 DD, mild MS (mean 5), PASP 44   COPD    pt is unsure if has been officially diagnosed   Coronary artery disease    CABG '09- cathed 12/09, 9/10, 6/11, 3/14 and 12/13/16- medical Rx // cath 12/2016 - 2/3 grafts patent >> med Rx // Myoview 12/17: low risk   Diabetes mellitus    Gastroesophageal reflux disease    Hiatal hernia    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   Hyperlipidemia    Hypertension    PONV (postoperative nausea and vomiting)    Schatzki's ring    Shoulder injury    resolved after shoulder surgery   Sleep apnea    not on cpap   Past Surgical History:  Past Surgical History:  Procedure Laterality Date   ABDOMINAL HYSTERECTOMY     APPENDECTOMY     came out with Hysterectomy   CARDIAC CATHETERIZATION  07/23/2009   EF 60%   CARDIAC CATHETERIZATION  10/11/2008   CARDIAC CATHETERIZATION  03/01/2007   EF 75-80%   CARDIOVASCULAR STRESS TEST  11/15/2007   EF 60%   COLONOSCOPY     CORONARY ARTERY BYPASS  GRAFT     SEVERELY DISEASED SAPHENOUS VEIN GRAFT TO THE RIGHT CORONARY ARTERY BUT WITH FAIRLY WELL PRESERVED FLOW TO THE DISTAL RIGHT CORONARY ARTERY FROM THE NATIVE CIRCULATION-RESTART  CATH IN JUNE 2000, REVEALS MILD/MODERATE  CAD WITH GOOD FLOW DOWN HER LAD   ESOPHAGOGASTRODUODENOSCOPY     EYE SURGERY     bilateral cataract surgery with lens implant   LEFT HEART CATH AND CORS/GRAFTS ANGIOGRAPHY N/A 12/14/2016   Procedure: LEFT HEART CATH AND CORS/GRAFTS ANGIOGRAPHY;  Surgeon: Jettie Booze, MD;  Location: [facility];  Service: Cardiovascular;  Laterality: N/A;   lense removal Left    POLYPECTOMY     RIGHT/LEFT HEART CATH AND CORONARY/GRAFT ANGIOGRAPHY N/A 07/28/2021   Procedure: RIGHT/LEFT HEART CATH AND CORONARY/GRAFT ANGIOGRAPHY;  Surgeon: Troy Sine, MD;  Location: [facility];  Service: Cardiovascular;  Laterality: N/A;   ROTATOR CUFF REPAIR     right and left   TONSILLECTOMY     age 35   TOTAL KNEE ARTHROPLASTY Right 02/20/2014   Procedure: RIGHT TOTAL KNEE ARTHROPLASTY;  Surgeon: Tobi Bastos, MD;  [facility];  Service: Orthopedics;  Laterality: Right;   tumor removed kidney     UPPER GASTROINTESTINAL ENDOSCOPY     US ECHOCARDIOGRAPHY  03/08/2008   EF 55-60%    Social History:  reports that she quit smoking about 46 years ago. Her smoking use included cigarettes. She started smoking about 67 years ago. She has a 10.50 pack-year smoking history. She has never used smokeless tobacco. She reports that she does not drink alcohol and does not use drugs. Family History:  Family History  Problem Relation Age of Onset   Heart disease Maternal Grandfather    Heart failure Maternal Grandfather    Diabetes Maternal Grandfather    Heart attack Father    Diabetes Mother    Rheum arthritis Sister    Emphysema Paternal Uncle    Esophageal cancer Brother 54       she said he was born with it   Emphysema Paternal Aunt    Healthy Child    Neuropathy Neg Hx    Multiple sclerosis Neg Hx    Colon cancer Neg Hx    Colon polyps Neg Hx    Rectal cancer Neg Hx    Stomach cancer Neg Hx      HOME MEDICATIONS: Allergies as of 03/30/2022       Reactions   Amlodipine Other (See Comments)   hallucinations    Banana Nausea And Vomiting   Stomach pumped   Co Q10 [coenzyme Q10] Other (See Comments)   Body cramps   Codeine Other (See Comments)   Hallucinate, loose identity and don't know who I am   Insulin Aspart Other (See Comments)   Unknown   Lisinopril Other (See Comments)   nose bleed   Metformin And Related Nausea And Vomiting   Morphine Other (See Comments)   Can not function, it immobilizes me    Pentazocine Nausea And Vomiting   Pravastatin Other (See Comments)   Hands locked up   Repatha [evolocumab]  Other (See Comments)   myalgias   Statins Other (See Comments)   Muscle cramps   Sulfa Antibiotics Swelling   Sulfonamide Derivatives Swelling   Tramadol Hcl Other (See Comments)   Dizziness, cant function         Medication List        Accurate as of March 30, 2022 12:20 PM. If  you have any questions, ask your nurse or doctor.          acetaminophen 325 MG tablet Commonly known as: TYLENOL Take 650 mg by mouth every 6 (six) hours as needed for moderate pain or headache.   albuterol 108 (90 Base) MCG/ACT inhaler Commonly known as: VENTOLIN HFA Inhale 2 puffs into the lungs every 6 (six) hours as needed. For shortness of breath. What changed: Another medication with the same name was changed. Make sure you understand how and when to take each.   albuterol (2.5 MG/3ML) 0.083% nebulizer solution Commonly known as: PROVENTIL USE 1 VIAL IN NEBULIZER 4 TIMES DAILY What changed: See the new instructions.   aspirin EC 81 MG tablet Take 1 tablet (81 mg total) by mouth daily.   baclofen 20 MG tablet Commonly known as: LIORESAL TAKE 2 TABLETS BY MOUTH 2 TIMES DAILY.   benzonatate 200 MG capsule Commonly known as: TESSALON Take 1 capsule (200 mg total) by mouth 3 (three) times daily as needed for cough.   calcium carbonate 1250 (500 Ca) MG chewable tablet Commonly known as: OS-CAL Chew 500 mg by mouth daily as needed for heartburn.   diltiazem 240 MG 24 hr capsule Commonly known as: CARDIZEM CD Take 1 capsule (240 mg total) by mouth daily.   divalproex 250 MG 24 hr tablet Commonly known as: Depakote ER Take 1 tablet (250 mg total) by mouth at bedtime.   fluticasone 50 MCG/ACT nasal spray Commonly known as: FLONASE SPRAY 2 SPRAYS INTO EACH NOSTRIL EVERY DAY   FreeStyle Libre 2 Reader Devi 1 Device by Does not apply route every 14 (fourteen) days.   FreeStyle Libre 2 Sensor Misc 1 Device by Does not apply route every 14 (fourteen) days.   furosemide 40 MG  tablet Commonly known as: LASIX Take 1 tablet (40 mg total) by mouth daily.   glucose blood test strip Use as instructed to test blood sugars up to 4 times daily   insulin lispro protamine-lispro (75-25) 100 UNIT/ML Susp injection Commonly known as: HUMALOG 75/25 MIX Inject 24 Units into the skin 2 (two) times daily with a meal. 10 units at lunch depending on bs reading What changed:  how much to take additional instructions   INSULIN SYRINGE .3CC/29GX1" 29G X 1" 0.3 ML Misc 1 Device by Does not apply route in the morning and at bedtime.   isosorbide mononitrate 60 MG 24 hr tablet Commonly known as: IMDUR Take one tablet ( 60 mg ) twice daily 8 hours apart. What changed:  how much to take when to take this   meclizine 25 MG tablet Commonly known as: ANTIVERT Take 25 mg by mouth 2 (two) times daily as needed for dizziness.   metoprolol succinate 100 MG 24 hr tablet Commonly known as: TOPROL-XL TAKE 1 TABLET BY MOUTH EVERY DAY What changed: when to take this   multivitamin with minerals Tabs tablet Take 1 tablet by mouth daily.   Nexlizet 180-10 MG Tabs Generic drug: Bempedoic Acid-Ezetimibe Take 1 tablet by mouth daily.   nitroGLYCERIN 0.4 MG SL tablet Commonly known as: NITROSTAT Place 1 tablet (0.4 mg total) under the tongue every 5 (five) minutes as needed for chest pain.   ORION 4 inclisiran or placebo 300 mg/1.5 mL SQ injection Inject 300 mg into the skin every 6 (six) months.   pantoprazole 40 MG tablet Commonly known as: Protonix Take 1 tablet (40 mg total) by mouth daily.   spironolactone 25 MG tablet Commonly  known as: ALDACTONE TAKE 1 TABLET BY MOUTH EVERY DAY What changed: when to take this   temazepam 30 MG capsule Commonly known as: RESTORIL Take 30 mg by mouth daily.   Trelegy Ellipta 200-62.5-25 MCG/ACT Aepb Generic drug: Fluticasone-Umeclidin-Vilant Inhale 1 puff into the lungs daily at 6 (six) AM.   VITAMIN B-12 IJ Inject 1,000 mcg as  directed every 30 (thirty) days.   Vitamin D 50 MCG (2000 UT) tablet Take 4,000 Units by mouth daily.         ALLERGIES: Allergies  Allergen Reactions   Amlodipine Other (See Comments)    hallucinations    Banana Nausea And Vomiting    Stomach pumped   Co Q10 [Coenzyme Q10] Other (See Comments)    Body cramps   Codeine Other (See Comments)    Hallucinate, loose identity and don't know who I am   Insulin Aspart Other (See Comments)    Unknown   Lisinopril Other (See Comments)    nose bleed   Metformin And Related Nausea And Vomiting   Morphine Other (See Comments)    Can not function, it immobilizes me    Pentazocine Nausea And Vomiting   Pravastatin Other (See Comments)    Hands locked up   Repatha [Evolocumab] Other (See Comments)    myalgias   Statins Other (See Comments)    Muscle cramps   Sulfa Antibiotics Swelling   Sulfonamide Derivatives Swelling   Tramadol Hcl Other (See Comments)    Dizziness, cant function      REVIEW OF SYSTEMS: A comprehensive ROS was conducted with the patient and is negative except as per HPI     OBJECTIVE:   VITAL SIGNS: BP (!) 140/88 (BP Location: Left Arm, Patient Position: Sitting, Cuff Size: Small)   Pulse 72   Ht 5' 2"$  (1.575 m)   Wt 168 lb 9.6 oz (76.5 kg)   LMP  (LMP Unknown)   BMI 30.84 kg/m    PHYSICAL EXAM:  General: Pt appears well and is in NAD  Neck: General: Supple without adenopathy or carotid bruits. Thyroid: Thyroid size normal.  No goiter or nodules appreciated.   Lungs: Clear with good BS bilat with no rales, rhonchi, or wheezes  Heart: RRR   Extremities:  Lower extremities - No pretibial edema. No lesions.  Neuro: MS is good with appropriate affect, pt is alert and Ox3    DATA REVIEWED:  Lab Results  Component Value Date   HGBA1C 6.6 (A) 03/30/2022   HGBA1C 7.1 (A) 12/28/2021   HGBA1C 7.1 12/28/2021   HGBA1C 7.1 (A) 12/28/2021   HGBA1C 7.1 (A) 12/28/2021    Latest Reference Range & Units  02/03/22 11:02  Sodium 135 - 145 mEq/L 141  Potassium 3.5 - 5.1 mEq/L 4.1  Chloride 96 - 112 mEq/L 104  CO2 19 - 32 mEq/L 31  Glucose 70 - 99 mg/dL 184 (H)  BUN 6 - 23 mg/dL 15  Creatinine 0.40 - 1.20 mg/dL 0.80  Calcium 8.4 - 10.5 mg/dL 10.0  Alkaline Phosphatase 39 - 117 U/L 93  Albumin 3.5 - 5.2 g/dL 4.3  AST 0 - 37 U/L 16  ALT 0 - 35 U/L 9  Total Protein 6.0 - 8.3 g/dL 7.6  Total Bilirubin 0.2 - 1.2 mg/dL 0.6  GFR >60.00 mL/min 68.17  (H): Data is abnormally high  ASSESSMENT / PLAN / RECOMMENDATIONS:   1) Type 1 Diabetes Mellitus, Optimaly controlled, With macrovascular complications - Most recent A1c of 6.6 %.  Goal A1c < 7.5 %.    - Pt endorses hyperglycemia and has been adjusting her insulin and currently on insulin TID  - We discussed the risk of hypoglycemia with insulin stacking and insulin mix should be taken BID to prevent stacking , we also entertained the idea or Basal/prandial  - We discussed fatal consequences of hypoglycemia  In office Bg 324 mg/dL  -A prescription for Freestyle libre sensors and receiver will be faxed to her DME supplier -She is intolerant to metformin -She cannot tolerate oral agents as it causes vomiting  MEDICATIONS: Increase Humalog mix to 24 units before breakfast and 24 units before supper  EDUCATION / INSTRUCTIONS: BG monitoring instructions: Patient is instructed to check her blood sugars 3 times a day, before each meal. Call [facility] if: BG persistently < 70  I reviewed the Rule of 15 for the treatment of hypoglycemia in detail with the patient. Literature supplied.   2) Diabetic complications:  Eye: Does not have known diabetic retinopathy.  Neuro/ Feet: Does not have known diabetic peripheral neuropathy. Renal: Patient does not have known baseline CKD. She is not on an ACEI/ARB at present.    Follow-up in 4 months   Signed electronically by: Mack Guise, MD  [facility]  [facility] [address] [place], [place] [address] Phone: [phone] FAX: [phone]   CC: Binnie Rail, MD [place] [address] Phone: [phone]  Fax: [phone]    Return to Endocrinology clinic as below: Future Appointments  Date Time Provider [place]  03/31/2022  1:00 PM [facility] PAT 2 [facility] None  04/05/2022 11:30 AM Marshell Garfinkel, MD [facility] None  04/07/2022  1:20 PM Binnie Rail, MD [facility] None  06/22/2022  9:30 AM [place] 1 [facility] None  06/28/2022  1:50 PM Pieter Partridge, DO [facility] None  10/07/2022  9:10 AM Burns, Claudina Lick, MD [facility] None

## 2022-03-30 NOTE — Patient Instructions (Signed)
Take Humalog Mix 18 units With Breakfast and 18 units with Supper      HOW TO TREAT LOW BLOOD SUGARS (Blood sugar LESS THAN 70 MG/DL) Please follow the RULE OF 15 for the treatment of hypoglycemia treatment (when your (blood sugars are less than 70 mg/dL)   STEP 1: Take 15 grams of carbohydrates when your blood sugar is low, which includes:  3-4 GLUCOSE TABS  OR 3-4 OZ OF JUICE OR REGULAR SODA OR ONE TUBE OF GLUCOSE GEL    STEP 2: RECHECK blood sugar in 15 MINUTES STEP 3: If your blood sugar is still low at the 15 minute recheck --> then, go back to STEP 1 and treat AGAIN with another 15 grams of carbohydrates.

## 2022-03-31 ENCOUNTER — Other Ambulatory Visit: Payer: Self-pay

## 2022-03-31 ENCOUNTER — Encounter (HOSPITAL_COMMUNITY)
Admission: RE | Admit: 2022-03-31 | Discharge: 2022-03-31 | Disposition: A | Discharge status: Home / Self Care | Payer: Medicare Other | Source: Ambulatory Visit | Attending: Orthopedic Surgery | Admitting: Orthopedic Surgery

## 2022-03-31 ENCOUNTER — Encounter (HOSPITAL_COMMUNITY): Payer: Self-pay

## 2022-03-31 VITALS — BP 137/74 | HR 62 | Temp 98.3°F | Resp 16 | Ht 62.0 in | Wt 168.4 lb

## 2022-03-31 DIAGNOSIS — E109 Type 1 diabetes mellitus without complications: Secondary | ICD-10-CM | POA: Diagnosis not present

## 2022-03-31 DIAGNOSIS — Z01818 Encounter for other preprocedural examination: Secondary | ICD-10-CM | POA: Insufficient documentation

## 2022-03-31 HISTORY — DX: Dyspnea, unspecified: R06.00

## 2022-03-31 HISTORY — DX: Headache, unspecified: R51.9

## 2022-03-31 HISTORY — DX: Unspecified osteoarthritis, unspecified site: M19.90

## 2022-03-31 LAB — BASIC METABOLIC PANEL
Anion gap: 11 (ref 5–15)
BUN: 14 mg/dL (ref 8–23)
CO2: 29 mmol/L (ref 22–32)
Calcium: 9.3 mg/dL (ref 8.9–10.3)
Chloride: 103 mmol/L (ref 98–111)
Creatinine, Ser: 0.86 mg/dL (ref 0.44–1.00)
GFR, Estimated: >60 mL/min (ref >60)
Glucose, Bld: 136 mg/dL — ABNORMAL HIGH (ref 70–99)
Potassium: 4.4 mmol/L (ref 3.5–5.1)
Sodium: 143 mmol/L (ref 135–145)

## 2022-03-31 LAB — HEMOGLOBIN A1C
Hgb A1c MFr Bld: 6.5 % — ABNORMAL HIGH (ref 4.8–5.6)
Mean Plasma Glucose: 139.85 mg/dL

## 2022-03-31 LAB — CBC
HCT: 41.3 % (ref 36.0–46.0)
Hemoglobin: 12.4 g/dL (ref 12.0–15.0)
MCH: 26.7 pg (ref 26.0–34.0)
MCHC: 30 g/dL (ref 30.0–36.0)
MCV: 88.8 fL (ref 80.0–100.0)
Platelets: 250 10*3/uL (ref 150–400)
RBC: 4.65 MIL/uL (ref 3.87–5.11)
RDW: 15.3 % (ref 11.5–15.5)
WBC: 5.4 10*3/uL (ref 4.0–10.5)
nRBC: 0 % (ref 0.0–0.2)

## 2022-03-31 LAB — GLUCOSE, CAPILLARY: Glucose-Capillary: 140 mg/dL — ABNORMAL HIGH (ref 70–99)

## 2022-04-01 LAB — SURGICAL PCR SCREEN
MRSA, PCR: POSITIVE — AB
Staphylococcus aureus: POSITIVE — AB

## 2022-04-01 NOTE — Progress Notes (Signed)
PCR results sent to Dr. Veverly Fells to review.

## 2022-04-02 ENCOUNTER — Telehealth: Payer: Self-pay | Admitting: *Deleted

## 2022-04-02 NOTE — Telephone Encounter (Addendum)
   Name: [name]  DOB: 1940/09/28  MRN: [id number]  Primary Cardiologist: Mertie Moores, MD  Chart reviewed as part of pre-operative protocol coverage. Because of [name]'s past medical history and time since last visit, she will require a follow-up in-office visit in order to better assess preoperative cardiovascular risk.  Pre-op covering staff: - Please schedule appointment and call patient to inform them. If patient already had an upcoming appointment within acceptable timeframe, please add "pre-op clearance" to the appointment notes so provider is aware. - Please contact requesting surgeon's office via preferred method (i.e, phone, fax) to inform them of need for appointment prior to surgery.  Per office protocol, if patient is without any new symptoms or concerns at the time of their virtual visit, she may hold Aspirin for 5-7 days prior to procedure. Please resume Aspirin as soon as possible postprocedure, at the discretion of the surgeon.    [name] Sciara, NP  04/02/2022, 1:54 PM

## 2022-04-02 NOTE — Telephone Encounter (Signed)
I have s/w the pt and she has agreed to in office appt for pre op clearance with Dr. Acie Fredrickson 04/07/22 @ 8 am. Pt will need to hold ASA 5-7 days. Pt has been advised to hold ASA as of today which will be 7 days prior to her procedure. Pt is agreeable to plan of care. I will update all parties involved.

## 2022-04-02 NOTE — Telephone Encounter (Signed)
   Pre-operative Risk Assessment    Patient Name: [name]  DOB: [birth year] MRN: [id number]      Request for Surgical Clearance    Procedure:   LEFT REVERSE TOTAL SHOULDER  Date of Surgery:  Clearance 04/09/22                                 Surgeon:  DR. [facility] Surgeon's Group or Practice Name:  [facility] Phone number:  [phone] ATTN: [place] Fax number:  [phone]   Type of Clearance Requested:   - Medical ; ASA   Type of Anesthesia:  General    Additional requests/questions:    [name]   04/02/2022, 1:32 PM

## 2022-04-02 NOTE — Progress Notes (Signed)
Anesthesia Chart Review   Case: [id number] Date/Time: 04/09/22 1428   Procedure: REVERSE SHOULDER ARTHROPLASTY (Left: Shoulder) - general and choice with interscalene block   Anesthesia type: General   Pre-op diagnosis: left shoulder osteoarthritis   Location: [facility] 06 / [facility]   Surgeons: Netta Cedars, MD       DISCUSSION:81 y.o. former smoker with h/o PONV, DM I (A1C 6.5), HTN, COPD, asthma, CAD (CABG 2009), CHF, left shoulder OA scheduled for above procedure 04/09/2022 with Dr. Netta Cedars.   Pt last seen by pulmonology 03/05/2022.   She was last seen by cardiology 01/29/2022.   Pulmonary and cardiology clearance requested.  VS: BP 137/74   Pulse 62   Temp 36.8 C (Oral)   Resp 16   Ht '5\' 2"'$  (1.575 m)   Wt 76.4 kg   LMP  (LMP Unknown)   BMI 30.81 kg/m   PROVIDERS: Binnie Rail, MD is PCP   Cardiologist:  Mertie Moores, MD   LABS: Labs reviewed: Acceptable for surgery. (all labs ordered are listed, but only abnormal results are displayed)  Labs Reviewed  SURGICAL PCR SCREEN - Abnormal; Notable for the following components:      Result Value   MRSA, PCR POSITIVE (*)    Staphylococcus aureus POSITIVE (*)    All other components within normal limits  BASIC METABOLIC PANEL - Abnormal; Notable for the following components:   Glucose, Bld 136 (*)    All other components within normal limits  HEMOGLOBIN A1C - Abnormal; Notable for the following components:   Hgb A1c MFr Bld 6.5 (*)    All other components within normal limits  GLUCOSE, CAPILLARY - Abnormal; Notable for the following components:   Glucose-Capillary 140 (*)    All other components within normal limits  CBC     IMAGES:   EKG:   CV: Echo 11/17/2021 1. Left ventricular ejection fraction, by estimation, is >75%. The left  ventricle has hyperdynamic function. The left ventricle has no regional  wall motion abnormalities. Left ventricular diastolic function could not  be evaluated.   2. Right  ventricular systolic function is normal. The right ventricular  size is normal. Tricuspid regurgitation signal is inadequate for assessing  PA pressure.   3. Left atrial size was mildly dilated.   4. The mitral valve is normal in structure. No evidence of mitral valve  regurgitation. Mild mitral stenosis. Severe mitral annular calcification.   5. The aortic valve is tricuspid. Aortic valve regurgitation is not  visualized. Aortic valve sclerosis/calcification is present, without any  evidence of aortic stenosis.   6. The inferior vena cava is normal in size with greater than 50%  respiratory variability, suggesting right atrial pressure of 3 mmHg.   Myocardial Perfusion 02/20/2020  Nuclear stress EF: 59%. The left ventricular ejection fraction is normal (55-65%). There was no ST segment deviation noted during stress. This is a low risk study.   No ischemia or infarction on perfusion imaging.  Past Medical History:  Diagnosis Date   Adenomatous colon polyp    Allergy    Anxiety    Arthritis    Asthma    Carotid artery disease ([place])    carotid US 02/2017: bilat ICA 1-39%   Chronic diastolic CHF    Echo 123456: EF 65-70, Gr 2 DD, mild MS (mean 5), PASP 44   COPD    pt is unsure if has been officially diagnosed   Coronary artery disease    CABG '  09- cathed 12/09, 9/10, 6/11, 3/14 and 12/13/16- medical Rx // cath 12/2016 - 2/3 grafts patent >> med Rx // Myoview 12/17: low risk   Diabetes mellitus    Dyspnea    with exertion   Gastroesophageal reflux disease    Headache    Hiatal hernia    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   Hyperlipidemia    Hypertension    Myocardial infarction ([place])    PONV (postoperative nausea and vomiting)    Schatzki's ring    Shoulder injury    resolved after shoulder surgery   Sleep apnea    not on cpap    Past Surgical History:  Procedure Laterality Date   ABDOMINAL HYSTERECTOMY     APPENDECTOMY     came out with Hysterectomy    CARDIAC CATHETERIZATION  07/23/2009   EF 60%   CARDIAC CATHETERIZATION  10/11/2008   CARDIAC CATHETERIZATION  03/01/2007   EF 75-80%   CARDIOVASCULAR STRESS TEST  11/15/2007   EF 60%   COLONOSCOPY     CORONARY ARTERY BYPASS GRAFT     SEVERELY DISEASED SAPHENOUS VEIN GRAFT TO THE RIGHT CORONARY ARTERY BUT WITH FAIRLY WELL PRESERVED FLOW TO THE DISTAL RIGHT CORONARY ARTERY FROM THE NATIVE CIRCULATION-RESTART  CATH IN JUNE 2000, REVEALS MILD/MODERATE  CAD WITH GOOD FLOW DOWN HER LAD   ESOPHAGOGASTRODUODENOSCOPY     EYE SURGERY     bilateral cataract surgery with lens implant   LEFT HEART CATH AND CORS/GRAFTS ANGIOGRAPHY N/A 12/14/2016   Procedure: LEFT HEART CATH AND CORS/GRAFTS ANGIOGRAPHY;  Surgeon: Jettie Booze, MD;  Location: [place] [facility];  Service: Cardiovascular;  Laterality: N/A;   lense removal Left    POLYPECTOMY     RIGHT/LEFT HEART CATH AND CORONARY/GRAFT ANGIOGRAPHY N/A 07/28/2021   Procedure: RIGHT/LEFT HEART CATH AND CORONARY/GRAFT ANGIOGRAPHY;  Surgeon: Troy Sine, MD;  Location: [facility];  Service: Cardiovascular;  Laterality: N/A;   ROTATOR CUFF REPAIR     right and left   TONSILLECTOMY     age 59   TOTAL KNEE ARTHROPLASTY Right 02/20/2014   Procedure: RIGHT TOTAL KNEE ARTHROPLASTY;  Surgeon: Tobi Bastos, MD;  Location: [facility];  Service: Orthopedics;  Laterality: Right;   tumor removed kidney     UPPER GASTROINTESTINAL ENDOSCOPY     US ECHOCARDIOGRAPHY  03/08/2008   EF 55-60%    MEDICATIONS:  acetaminophen (TYLENOL) 325 MG tablet   albuterol (PROVENTIL HFA;VENTOLIN HFA) 108 (90 BASE) MCG/ACT inhaler   albuterol (PROVENTIL) (2.5 MG/3ML) 0.083% nebulizer solution   aspirin EC 81 MG tablet   baclofen (LIORESAL) 20 MG tablet   Bempedoic Acid-Ezetimibe (NEXLIZET) 180-10 MG TABS   benzonatate (TESSALON) 200 MG capsule   calcium carbonate (OS-CAL) 1250 (500 Ca) MG chewable tablet   Cholecalciferol (VITAMIN D) 50 MCG (2000 UT) tablet    Continuous Blood Gluc Receiver (FREESTYLE LIBRE 2 READER) DEVI   Continuous Blood Gluc Sensor (FREESTYLE LIBRE 2 SENSOR) MISC   Cyanocobalamin (VITAMIN B-12 IJ)   diltiazem (CARDIZEM CD) 240 MG 24 hr capsule   divalproex (DEPAKOTE ER) 250 MG 24 hr tablet   fluticasone (FLONASE) 50 MCG/ACT nasal spray   Fluticasone-Umeclidin-Vilant (TRELEGY ELLIPTA) 200-62.5-25 MCG/ACT AEPB   furosemide (LASIX) 40 MG tablet   glucose blood test strip   insulin lispro protamine-lispro (HUMALOG 75/25 MIX) (75-25) 100 UNIT/ML SUSP injection   Insulin Syringe-Needle U-100 (INSULIN SYRINGE .3CC/29GX1") 29G X 1" 0.3 ML MISC  isosorbide mononitrate (IMDUR) 60 MG 24 hr tablet   meclizine (ANTIVERT) 25 MG tablet   metoprolol succinate (TOPROL-XL) 100 MG 24 hr tablet   Multiple Vitamin (MULTIVITAMIN WITH MINERALS) TABS   nitroGLYCERIN (NITROSTAT) 0.4 MG SL tablet   pantoprazole (PROTONIX) 40 MG tablet   spironolactone (ALDACTONE) 25 MG tablet   Study - ORION 4 - inclisiran 300 mg/1.43m or placebo SQ injection ([facility]-[place])   temazepam (RESTORIL) 30 MG capsule   No current facility-administered medications for this encounter.     JKonrad FelixWard, PA-C [facility] [phone]

## 2022-04-05 ENCOUNTER — Encounter: Payer: Self-pay | Admitting: Pulmonary Disease

## 2022-04-05 ENCOUNTER — Encounter: Payer: Self-pay | Admitting: Cardiovascular Disease

## 2022-04-05 ENCOUNTER — Ambulatory Visit (INDEPENDENT_AMBULATORY_CARE_PROVIDER_SITE_OTHER): Payer: Medicare Other | Admitting: Pulmonary Disease

## 2022-04-05 VITALS — BP 142/74 | HR 71 | Temp 98.1°F | Ht 62.0 in | Wt 168.0 lb

## 2022-04-05 DIAGNOSIS — J4489 Other specified chronic obstructive pulmonary disease: Secondary | ICD-10-CM | POA: Diagnosis not present

## 2022-04-05 NOTE — Progress Notes (Signed)
This encounter was created in error - please disregard.

## 2022-04-05 NOTE — Patient Instructions (Addendum)
Continue inhalers Take chlorpheniramine twice a day during the daytime at night to reduce postnasal drip Follow-up in 6 months.

## 2022-04-05 NOTE — Telephone Encounter (Signed)
   Patient Name: [name]  DOB: [birth year] MRN: [id number]  Primary Cardiologist: Mertie Moores, MD  Chart reviewed as part of pre-operative protocol coverage.  Received message from Dr. Acie Fredrickson stating he cleared patient for shoulder surgery in 10/2021.  Patient therefore will not require office visit for clearance but will require a televisit.  I have notified the preop coverage team who cancel office visit and schedule televisit for preop clearance.    Lenna Sciara, NP 04/05/2022, 4:25 PM

## 2022-04-05 NOTE — Telephone Encounter (Signed)
I left a message for the pt to call back about in office appt 04/07/22. Per Dr. Acie Fredrickson pt does not need an IN OFFICE appt as he did clear her back in Sept appt. However per Diona Browner, NP, she did state as the procedure is more than 2 months after the pt saw Dr. Acie Fredrickson, pt will need just a tele appt. I will cancel the 04/07/22 appt and will schedule a tele appt when the pt calls back. I will update the requesting office as well.

## 2022-04-05 NOTE — Progress Notes (Unsigned)
[name]    [id number]    [birth year]  Primary Care Physician:[name], [name] Lick, MD  Referring Physician: Binnie Rail, MD [place],  [place] [id number]  Chief complaint: Follow-up for COPD, asthma, OSA  HPI: [name] has history of COPD, asthma overlap syndrome, GERD, OSA.  Previously followed by Dr. Ashok [name] and Dr. Shelbie [name] on Advair, Spiriva.  Has daily symptoms of dyspnea.  Using albuterol every day No nighttime awakenings.  Reports symptom of GERD.  Declined referral to GI Has history of sleep apnea. CPAP machine was discontinued as patient was noncompliant although she claims that she had been using it regularly.  Follow-up sleep studies in 2018 was nondiagnostic as she slept 20 4/2 now. Home sleep study in 2019 did not demonstrate OSA but there is nocturnal hypoxemia.  Patient was prescribed O2 at night but is not using it regularly More recently seen by neurology for headaches which is thought to be secondary to sleep apnea.  She has started using the oxygen at night for the past week.  Pets: No pets Occupation: Retired Education officer, museum from [employer] Exposures: No known exposures.  No mold, hot tub, Jacuzzi, no down pillows or comforters. Smoking history: 10-pack-year smoker.  Quit around 1990 Travel history: No significant travel history Relevant family history: No significant family history of lung disease  ACT score 11/03/2018-9  Interim history: She was seen in early January 2024 for acute bronchitis, cough Chest x-ray at that time did not show any significant abnormalities.  She was given Z-Pak and prednisone with no significant improvement in symptoms  Her chief complaint today is sinus congestion, postnasal drip, sinus heaviness and she still continues to have some cough and dyspnea.  She is scheduled for left shoulder surgery on 3/1 and needs preop evaluation.  Outpatient Encounter Medications as of 04/05/2022   Medication Sig   acetaminophen (TYLENOL) 325 MG tablet Take 650 mg by mouth every 6 (six) hours as needed for moderate pain or headache.   albuterol (PROVENTIL HFA;VENTOLIN HFA) 108 (90 BASE) MCG/ACT inhaler Inhale 2 puffs into the lungs every 6 (six) hours as needed. For shortness of breath.   albuterol (PROVENTIL) (2.5 MG/3ML) 0.083% nebulizer solution USE 1 VIAL IN NEBULIZER 4 TIMES DAILY (Patient taking differently: Take 2.5 mg by nebulization every 6 (six) hours as needed for wheezing or shortness of breath.)   aspirin EC 81 MG tablet Take 1 tablet (81 mg total) by mouth daily.   baclofen (LIORESAL) 20 MG tablet TAKE 2 TABLETS BY MOUTH 2 TIMES DAILY.   Bempedoic Acid-Ezetimibe (NEXLIZET) 180-10 MG TABS Take 1 tablet by mouth daily.   benzonatate (TESSALON) 200 MG capsule Take 1 capsule (200 mg total) by mouth 3 (three) times daily as needed for cough.   calcium carbonate (OS-CAL) 1250 (500 Ca) MG chewable tablet Chew 500 mg by mouth daily as needed for heartburn.   Cholecalciferol (VITAMIN D) 50 MCG (2000 UT) tablet Take 4,000 Units by mouth daily.   Continuous Blood Gluc Receiver (FREESTYLE LIBRE 2 READER) DEVI 1 Device by Does not apply route every 14 (fourteen) days.   Continuous Blood Gluc Sensor (FREESTYLE LIBRE 2 SENSOR) MISC 1 Device by Does not apply route every 14 (fourteen) days.   Cyanocobalamin (VITAMIN B-12 IJ) Inject 1,000 mcg as directed every 30 (thirty) days.   diltiazem (CARDIZEM CD) 240 MG 24 hr capsule Take 1 capsule (240 mg total) by mouth  daily.   divalproex (DEPAKOTE ER) 250 MG 24 hr tablet Take 1 tablet (250 mg total) by mouth at bedtime.   fluticasone (FLONASE) 50 MCG/ACT nasal spray SPRAY 2 SPRAYS INTO EACH NOSTRIL EVERY DAY   Fluticasone-Umeclidin-Vilant (TRELEGY ELLIPTA) 200-62.5-25 MCG/ACT AEPB Inhale 1 puff into the lungs daily at 6 (six) AM.   glucose blood test strip Use as instructed to test blood sugars up to 4 times daily   insulin lispro protamine-lispro  (HUMALOG 75/25 MIX) (75-25) 100 UNIT/ML SUSP injection Inject 18 Units into the skin 2 (two) times daily with a meal. 10 units at lunch depending on bs reading (Patient taking differently: Inject 10-18 Units into the skin 3 (three) times daily with meals. 18 units at breakfast and dinner. 10 units at lunch depending on bs reading)   Insulin Syringe-Needle U-100 (INSULIN SYRINGE .3CC/29GX1") 29G X 1" 0.3 ML MISC 1 Device by Does not apply route in the morning and at bedtime.   isosorbide mononitrate (IMDUR) 60 MG 24 hr tablet Take one tablet ( 60 mg ) twice daily 8 hours apart.   meclizine (ANTIVERT) 25 MG tablet Take 25 mg by mouth 2 (two) times daily as needed for dizziness.   metoprolol succinate (TOPROL-XL) 100 MG 24 hr tablet TAKE 1 TABLET BY MOUTH EVERY DAY (Patient taking differently: Take 100 mg by mouth daily with supper.)   Multiple Vitamin (MULTIVITAMIN WITH MINERALS) TABS Take 1 tablet by mouth daily.   nitroGLYCERIN (NITROSTAT) 0.4 MG SL tablet Place 1 tablet (0.4 mg total) under the tongue every 5 (five) minutes as needed for chest pain.   pantoprazole (PROTONIX) 40 MG tablet Take 1 tablet (40 mg total) by mouth daily.   spironolactone (ALDACTONE) 25 MG tablet TAKE 1 TABLET BY MOUTH EVERY DAY (Patient taking differently: Take 25 mg by mouth every evening.)   Study - ORION 4 - inclisiran 300 mg/1.25m or placebo SQ injection (PI-Stuckey) Inject 300 mg into the skin every 6 (six) months.   temazepam (RESTORIL) 30 MG capsule Take 30 mg by mouth daily.   furosemide (LASIX) 40 MG tablet Take 1 tablet (40 mg total) by mouth daily.   No facility-administered encounter medications on file as of 04/05/2022.   Physical Exam: Blood pressure (!) 142/74, pulse 71, temperature 98.1 F (36.7 C), temperature source Oral, height '5\' 2"'$  (1.575 m), weight 168 lb (76.2 kg), SpO2 92 %. Gen:      No acute distress HEENT:  EOMI, sclera anicteric Neck:     No masses; no thyromegaly Lungs:    Clear to  auscultation bilaterally; normal respiratory effort CV:         Regular rate and rhythm; no murmurs Abd:      + bowel sounds; soft, non-tender; no palpable masses, no distension Ext:    No edema; adequate peripheral perfusion Skin:      Warm and dry; no rash Neuro: alert and oriented x 3 Psych: normal mood and affect   Data Reviewed: Imaging: CT chest 12/20/2017- right lower lobe nodular opacity has resolved compatible with atelectasis.  Apical pulmonary nodule decreased in size.  Emphysema. 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  No acute pulmonary disease. Chest x-ray 03/08/2019- Mild interstitial markings with atelectasis. Chest x-ray 12/20/2021-lungs are clear with no acute cardiopulmonary process. Chest x-ray 02/15/2022-no active cardiopulmonary disease. I have reviewed the images personally.  PFTs: 06/04/15: FVC 1.57 L (77%) FEV1 1.01 L (64%) FEV1/FVC 0.65 FEF 25-75 0.54 L (39%) negative bronchodilator response 02/21/15: FVC 1.60 L (  78%) FEV1 1.03 L (65%) FEV1/FVC 0.64 FEF 25-75 0.51 L (37%) positive bronchodilator response 11/20/14: FVC 1.44 L (70%) FEV1 0.91 L (57%) FEV1/FVC 0.63 FEF 25-75 0.47 L (33%) positive bronchodilator response TLC 3.92 L (79%) RV 103% DLCO uncorrected 64% 11/12/13: FVC 1.57 L (74%) FEV1 1.09 L (67%) FEV1/FVC 0.70 FEF 25-75 0.72 L (50%) negative bronchodilator response 08/04/11: FVC 1.61 L (60%) FEV1 0.89 L (48%) FEV1/FVC 0.56 FEF 25-75 0.29 L (14%) positive bronchodilator response TLC 3.67 L (80%) RV 103% ERV 46% DLCO corrected 38%  Labs: CBC 10/20/2017-WBC 6, eos 2.6%, absolute eosinophil count 156 CBC 02/12/2019-WBC 7.1, eos 1.8%, absolute eosinophil count 128  Sleep Home sleep study 03/29/2017- AHI 1.8, desats to 78% PSG 10/07/2016- AHI 6.8.  Total sleep time 40.5 minutes. In lab sleep study 11/20/2018-mild sleep apnea AHI 13.3, low O2 sat of 81%. Home sleep study 11/27/2021-no significant sleep apnea with AHI at 3.4/hour and SPO2 low at 83% with average O2  saturations at 92%.   Cardiac: Echocardiogram 11/17/2021-LVEF greater than 75%, normal RV systolic function.  TR signal is inadequate for measuring PA pressure.  Assessment:  Preop evaluation for shoulder surgery Peri-operative Assessment of Pulmonary Risk for Non-Thoracic Surgery:  Her breathing status appears reasonably well-controlled with no wheezing on examination.  She is overall low risk for undergoing planned procedure.  ForMs. Gibby, risk of perioperative pulmonary complications is increased by:  Age greater than 63 years  COPD, asthma  Respiratory complications generally occur in 1% of ASA Class I patients, 5% of ASA Class II and 10% of ASA Class III-IV patients These complications rarely result in mortality and iclude postoperative pneumonia, atelectasis, pulmonary embolism, ARDS and increased time requiring postoperative mechanical ventilation.  Overall, I recommend proceeding with the surgery if the risk for respiratory complications are outweighed by the potential benefits. This will need to be discussed between the patient and surgeon.  To reduce risks of respiratory complications, I recommend: --Pre- and post-operative incentive spirometry performed frequently while awake --Avoiding use of pancuronium during anesthesia.  I have discussed the risk factors and recommendations above with the patient.   Severe persistent asthma, COPD, acute bronchitis, cough She is getting around episode of acute sinusitis. Continue Trelegy inhaler Her main complaint seems to be upper airway congestion with postnasal drip Continue Flonase, chlorphentermine antihistamine, Tessalon for cough Continue Protonix once daily for treatment of reflux.  Nocturnal hypoxia Home sleep study reviewed with no significant sleep apnea.  She does have low O2 sat of 83% and was previously on nocturnal oxygen. Does not want to go back on O2  Plan/Recommendations: Continue Trelegy inhaler Steroid nasal  spray, antihistamine, Tessalon Protonix twice daily  Marshell Garfinkel MD [facility] 04/05/2022, 11:43 AM  CC: [name] Rail, MD

## 2022-04-06 ENCOUNTER — Telehealth: Payer: Self-pay | Admitting: Pulmonary Disease

## 2022-04-06 NOTE — Telephone Encounter (Signed)
Patient is returning call.  °

## 2022-04-06 NOTE — Telephone Encounter (Signed)
Fax received from Dr. Esmond Plants with [facility] to perform a LEFT REVERSE TOTAL SHOULDER on patient.  Patient needs surgery clearance. Surgery is 04/09/2022. Patient was seen on 03/05/2022. Office protocol is a risk assessment can be sent to surgeon if patient has been seen in 60 days or less.   Sending to Dr. Vaughan Browner for risk assessment or recommendations if patient needs to be seen in office prior to surgical procedure.

## 2022-04-06 NOTE — Telephone Encounter (Signed)
I s/w the pt and she tells me that she has had diarrhea all week and it is not getting any better. I advised pt to call her PCP and she needs to call the surgeon as well. I stated that the surgeon is mostly likely going to want to post pone surgery until she is feeling better. Pt agreed. I asked the pt to call back and let us know what the surgeon said and then we will schedule a tele visit at that time.   I will update the surgeon office as well as FYI.

## 2022-04-07 ENCOUNTER — Encounter: Payer: Self-pay | Admitting: Internal Medicine

## 2022-04-07 ENCOUNTER — Ambulatory Visit (INDEPENDENT_AMBULATORY_CARE_PROVIDER_SITE_OTHER): Payer: Medicare Other | Admitting: Internal Medicine

## 2022-04-07 ENCOUNTER — Encounter: Payer: Medicare Other | Admitting: Cardiovascular Disease

## 2022-04-07 VITALS — BP 140/80 | HR 80 | Temp 98.4°F | Resp 18 | Ht 62.0 in | Wt 165.0 lb

## 2022-04-07 DIAGNOSIS — I10 Essential (primary) hypertension: Secondary | ICD-10-CM

## 2022-04-07 DIAGNOSIS — I5032 Chronic diastolic (congestive) heart failure: Secondary | ICD-10-CM

## 2022-04-07 DIAGNOSIS — K219 Gastro-esophageal reflux disease without esophagitis: Secondary | ICD-10-CM

## 2022-04-07 DIAGNOSIS — I2510 Atherosclerotic heart disease of native coronary artery without angina pectoris: Secondary | ICD-10-CM

## 2022-04-07 DIAGNOSIS — E1059 Type 1 diabetes mellitus with other circulatory complications: Secondary | ICD-10-CM

## 2022-04-07 DIAGNOSIS — F419 Anxiety disorder, unspecified: Secondary | ICD-10-CM

## 2022-04-07 DIAGNOSIS — R197 Diarrhea, unspecified: Secondary | ICD-10-CM

## 2022-04-07 DIAGNOSIS — E538 Deficiency of other specified B group vitamins: Secondary | ICD-10-CM | POA: Diagnosis not present

## 2022-04-07 DIAGNOSIS — R252 Cramp and spasm: Secondary | ICD-10-CM

## 2022-04-07 DIAGNOSIS — E7800 Pure hypercholesterolemia, unspecified: Secondary | ICD-10-CM

## 2022-04-07 DIAGNOSIS — G4709 Other insomnia: Secondary | ICD-10-CM

## 2022-04-07 LAB — POC INFLUENZA A&B (BINAX/QUICKVUE)
Influenza A, POC: NEGATIVE
Influenza B, POC: NEGATIVE

## 2022-04-07 MED ORDER — TEMAZEPAM 30 MG PO CAPS: 30.0000 mg | ORAL_CAPSULE | Freq: Every day | ORAL | 0 refills | Status: DC

## 2022-04-07 MED ORDER — CYANOCOBALAMIN 1000 MCG/ML IJ SOLN
1000.0000 ug | Freq: Once | INTRAMUSCULAR | Status: AC
  Administered 2022-04-07: 1000 ug via INTRAMUSCULAR

## 2022-04-07 NOTE — Assessment & Plan Note (Signed)
Chronic Severe in nature Continue baclofen 40 mg twice daily

## 2022-04-07 NOTE — Progress Notes (Signed)
Subjective:    Patient ID: [name], female    DOB: 11/12/1940, 82 y.o.   MRN: [id number]     HPI [name] is here for follow up of her chronic medical problems, including DM, htn, CAD, HFpEF, hld, GERD, B12 def, insomnia, anxiety, muscle cramping  B12 injection - has not been keeping up with them monthly.   Having diarrhea since Monday - having 4 BM's a day - it is watery, taking pepto bismol w/o improvement.  Having abdominal cramping. No fever.  No recent abx.    To have reverse shoulder arthroplasty in May    Medications and allergies reviewed with patient and updated if appropriate.  Current Outpatient Medications on File Prior to Visit  Medication Sig Dispense Refill   acetaminophen (TYLENOL) 325 MG tablet Take 650 mg by mouth every 6 (six) hours as needed for moderate pain or headache.     albuterol (PROVENTIL HFA;VENTOLIN HFA) 108 (90 BASE) MCG/ACT inhaler Inhale 2 puffs into the lungs every 6 (six) hours as needed. For shortness of breath. 1 Inhaler 0   albuterol (PROVENTIL) (2.5 MG/3ML) 0.083% nebulizer solution USE 1 VIAL IN NEBULIZER 4 TIMES DAILY (Patient taking differently: Take 2.5 mg by nebulization every 6 (six) hours as needed for wheezing or shortness of breath.) 360 mL 1   aspirin EC 81 MG tablet Take 1 tablet (81 mg total) by mouth daily.     baclofen (LIORESAL) 20 MG tablet TAKE 2 TABLETS BY MOUTH 2 TIMES DAILY. 360 tablet 2   Bempedoic Acid-Ezetimibe (NEXLIZET) 180-10 MG TABS Take 1 tablet by mouth daily. 90 tablet 3   benzonatate (TESSALON) 200 MG capsule Take 1 capsule (200 mg total) by mouth 3 (three) times daily as needed for cough. 90 capsule 0   calcium carbonate (OS-CAL) 1250 (500 Ca) MG chewable tablet Chew 500 mg by mouth daily as needed for heartburn.     Cholecalciferol (VITAMIN D) 50 MCG (2000 UT) tablet Take 4,000 Units by mouth daily.     Continuous Blood Gluc Receiver (FREESTYLE LIBRE 2 READER) DEVI 1 Device by Does not apply route every 14  (fourteen) days. 1 each 3   Continuous Blood Gluc Sensor (FREESTYLE LIBRE 2 SENSOR) MISC 1 Device by Does not apply route every 14 (fourteen) days. 6 each 3   Cyanocobalamin (VITAMIN B-12 IJ) Inject 1,000 mcg as directed every 30 (thirty) days.     diltiazem (CARDIZEM CD) 240 MG 24 hr capsule Take 1 capsule (240 mg total) by mouth daily. 90 capsule 2   divalproex (DEPAKOTE ER) 250 MG 24 hr tablet Take 1 tablet (250 mg total) by mouth at bedtime. 30 tablet 5   fluticasone (FLONASE) 50 MCG/ACT nasal spray SPRAY 2 SPRAYS INTO EACH NOSTRIL EVERY DAY 48 mL 3   Fluticasone-Umeclidin-Vilant (TRELEGY ELLIPTA) 200-62.5-25 MCG/ACT AEPB Inhale 1 puff into the lungs daily at 6 (six) AM. 1 each 0   glucose blood test strip Use as instructed to test blood sugars up to 4 times daily 200 each 12   insulin lispro protamine-lispro (HUMALOG 75/25 MIX) (75-25) 100 UNIT/ML SUSP injection Inject 18 Units into the skin 2 (two) times daily with a meal. 10 units at lunch depending on bs reading (Patient taking differently: Inject 10-18 Units into the skin 3 (three) times daily with meals. 18 units at breakfast and dinner. 10 units at lunch depending on bs reading) 40 mL 3   Insulin Syringe-Needle U-100 (INSULIN SYRINGE .3CC/29GX1") 29G X  1" 0.3 ML MISC 1 Device by Does not apply route in the morning and at bedtime. 200 each 3   isosorbide mononitrate (IMDUR) 60 MG 24 hr tablet Take one tablet ( 60 mg ) twice daily 8 hours apart. 180 tablet 3   meclizine (ANTIVERT) 25 MG tablet Take 25 mg by mouth 2 (two) times daily as needed for dizziness.     metoprolol succinate (TOPROL-XL) 100 MG 24 hr tablet TAKE 1 TABLET BY MOUTH EVERY DAY (Patient taking differently: Take 100 mg by mouth daily with supper.) 90 tablet 3   Multiple Vitamin (MULTIVITAMIN WITH MINERALS) TABS Take 1 tablet by mouth daily.     nitroGLYCERIN (NITROSTAT) 0.4 MG SL tablet Place 1 tablet (0.4 mg total) under the tongue every 5 (five) minutes as needed for chest  pain. 25 tablet 3   pantoprazole (PROTONIX) 40 MG tablet Take 1 tablet (40 mg total) by mouth daily. 30 tablet 0   spironolactone (ALDACTONE) 25 MG tablet TAKE 1 TABLET BY MOUTH EVERY DAY (Patient taking differently: Take 25 mg by mouth every evening.) 90 tablet 3   Study - ORION 4 - inclisiran 300 mg/1.87m or placebo SQ injection (PI-Stuckey) Inject 300 mg into the skin every 6 (six) months.     furosemide (LASIX) 40 MG tablet Take 1 tablet (40 mg total) by mouth daily. 30 tablet    No current facility-administered medications on file prior to visit.     Review of Systems  Constitutional:  Positive for fatigue. Negative for fever.  HENT:  Positive for rhinorrhea. Negative for congestion, sinus pain and sore throat.   Respiratory:  Positive for cough, shortness of breath (worse x 2 days.) and wheezing.   Cardiovascular:  Negative for chest pain, palpitations and leg swelling.  Gastrointestinal:  Positive for abdominal pain (cramping) and diarrhea. Negative for blood in stool, nausea and vomiting.  Musculoskeletal:  Negative for myalgias.  Neurological:  Positive for dizziness, light-headedness and headaches (chronic - daiily - no change).       Objective:   Vitals:   04/07/22 1319 04/07/22 1412  BP: (!) 144/76 (!) 140/80  Pulse: 80   Resp: 18   Temp: 98.4 F (36.9 C)    BP Readings from Last 3 Encounters:  04/07/22 (!) 140/80  04/05/22 (!) 142/74  03/31/22 137/74   Wt Readings from Last 3 Encounters:  04/07/22 165 lb (74.8 kg)  04/05/22 168 lb (76.2 kg)  03/31/22 168 lb 6.9 oz (76.4 kg)   Body mass index is 30.18 kg/m.    Physical Exam Constitutional:      General: She is not in acute distress.    Appearance: Normal appearance.  HENT:     Head: Normocephalic and atraumatic.  Eyes:     Conjunctiva/sclera: Conjunctivae normal.  Cardiovascular:     Rate and Rhythm: Normal rate and regular rhythm.     Heart sounds: Normal heart sounds.  Pulmonary:     Effort:  Pulmonary effort is normal. No respiratory distress.     Breath sounds: Normal breath sounds. No wheezing.  Abdominal:     General: There is no distension.     Palpations: Abdomen is soft.     Tenderness: There is no abdominal tenderness.  Musculoskeletal:     Cervical back: Neck supple.     Right lower leg: No edema.     Left lower leg: No edema.  Lymphadenopathy:     Cervical: No cervical adenopathy.  Skin:  General: Skin is warm and dry.     Findings: No rash.  Neurological:     Mental Status: She is alert. Mental status is at baseline.  Psychiatric:        Mood and Affect: Mood normal.        Behavior: Behavior normal.        Lab Results  Component Value Date   WBC 5.4 03/31/2022   HGB 12.4 03/31/2022   HCT 41.3 03/31/2022   PLT 250 03/31/2022   GLUCOSE 136 (H) 03/31/2022   CHOL 108 10/21/2020   TRIG 125 10/21/2020   HDL 27 (L) 10/21/2020   LDLCALC 58 10/21/2020   ALT 9 02/03/2022   AST 16 02/03/2022   NA 143 03/31/2022   K 4.4 03/31/2022   CL 103 03/31/2022   CREATININE 0.86 03/31/2022   BUN 14 03/31/2022   CO2 29 03/31/2022   TSH 1.616 12/20/2021   INR 1.1 07/28/2021   HGBA1C 6.5 (H) 03/31/2022   MICROALBUR 1.6 03/16/2021     Assessment & Plan:    See Problem List for Assessment and Plan of chronic medical problems.

## 2022-04-07 NOTE — Patient Instructions (Addendum)
      Stool and urine tests were ordered.   The lab is on the first floor.    Medications changes include :   none      Return in about 6 months (around 10/06/2022) for follow up.

## 2022-04-07 NOTE — Assessment & Plan Note (Addendum)
Chronic Initially prescribed by ortho Controlled, Stable Has been out for 3 weeks and has not been sleeping - I will refill Continue Restoril 30 mg daily Discussed risks of taking medication

## 2022-04-07 NOTE — Telephone Encounter (Signed)
Pre op eval completed in clinic note

## 2022-04-07 NOTE — Assessment & Plan Note (Signed)
Chronic Currently with cardiology Appears euvolemic Continue current medications

## 2022-04-07 NOTE — Assessment & Plan Note (Signed)
Chronic Blood pressure well controlled CMP Continue diltiazem to 40 mg daily isosorbide mononitrate 60 mg twice daily, metoprolol XL 100 mg daily, spironolactone 25 mg daily

## 2022-04-07 NOTE — Assessment & Plan Note (Addendum)
Chronic Regular exercise and healthy diet encouraged Currently in a study-Orion every 6 months

## 2022-04-07 NOTE — Assessment & Plan Note (Signed)
Chronic GERD controlled Continue pantoprazole 40 mg daily

## 2022-04-07 NOTE — Assessment & Plan Note (Addendum)
Chronic Overall controlled She is taking Restoril 30 mg nightly - discussed risks and benefits - she would like to continue - I will refill

## 2022-04-07 NOTE — Assessment & Plan Note (Signed)
Chronic Following with cardiology

## 2022-04-07 NOTE — Assessment & Plan Note (Signed)
Chronic Continue B12 injections monthly-she is not always compliant with getting them on a timely monthly basis B12 injection today

## 2022-04-07 NOTE — Assessment & Plan Note (Signed)
Chronic Following with endocrine

## 2022-04-07 NOTE — Assessment & Plan Note (Signed)
Acute Started two days ago - having 4 episodes a day of watery stools - abd cramping prior to BM, no fever, no blood No recent abx Likely viral  Flu neg today GI stool panel Call if no improvement

## 2022-04-08 LAB — MICROALBUMIN / CREATININE URINE RATIO
Creatinine,U: 144.3 mg/dL
Microalb Creat Ratio: 1.2 mg/g (ref 0.0–30.0)
Microalb, Ur: 1.8 mg/dL (ref 0.0–1.9)

## 2022-04-08 NOTE — Telephone Encounter (Signed)
Copy of office visit note has been printed and faxed to [facility]. Will update once I receive a confirmation fax.

## 2022-04-10 LAB — GI PROFILE, STOOL, PCR

## 2022-04-12 NOTE — Telephone Encounter (Signed)
OV notes and clearance form have been faxed back to [facility]. Nothing further needed at this time. ?

## 2022-04-13 LAB — HM MAMMOGRAPHY

## 2022-04-15 ENCOUNTER — Ambulatory Visit: Payer: Medicare Other | Admitting: Neurology

## 2022-04-15 ENCOUNTER — Encounter: Payer: Self-pay | Admitting: Internal Medicine

## 2022-04-15 NOTE — Progress Notes (Signed)
Outside notes received. Information abstracted. Notes sent to scan.  

## 2022-05-03 ENCOUNTER — Other Ambulatory Visit: Payer: Self-pay

## 2022-05-03 MED ORDER — FREESTYLE LIBRE 2 SENSOR MISC: 1.0000 | 3 refills | Status: DC

## 2022-05-03 MED ORDER — FREESTYLE LIBRE 2 READER DEVI: 1.0000 | 3 refills | Status: DC

## 2022-05-03 NOTE — Telephone Encounter (Signed)
Freestyle refill request.  RX sent to pharmacy.

## 2022-05-05 ENCOUNTER — Telehealth: Payer: Self-pay

## 2022-05-05 NOTE — Telephone Encounter (Signed)
Clinical notes sent to [facility] per fax request to [phone]

## 2022-05-08 ENCOUNTER — Other Ambulatory Visit: Payer: Self-pay | Admitting: Internal Medicine

## 2022-05-10 ENCOUNTER — Other Ambulatory Visit: Payer: Self-pay | Admitting: Neurology

## 2022-05-11 ENCOUNTER — Other Ambulatory Visit: Payer: Self-pay | Admitting: Cardiovascular Disease

## 2022-06-01 ENCOUNTER — Telehealth: Payer: Self-pay

## 2022-06-01 NOTE — Telephone Encounter (Signed)
Attempted to contact patient 2 times but line keeps ringing busy. Calling regarding blood sugar reading.

## 2022-06-02 ENCOUNTER — Ambulatory Visit (INDEPENDENT_AMBULATORY_CARE_PROVIDER_SITE_OTHER): Payer: Medicare Other | Admitting: Internal Medicine

## 2022-06-02 ENCOUNTER — Encounter: Payer: Self-pay | Admitting: Internal Medicine

## 2022-06-02 VITALS — BP 120/62 | Temp 98.7°F | Ht 62.0 in | Wt 169.2 lb

## 2022-06-02 DIAGNOSIS — K219 Gastro-esophageal reflux disease without esophagitis: Secondary | ICD-10-CM

## 2022-06-02 DIAGNOSIS — K589 Irritable bowel syndrome without diarrhea: Secondary | ICD-10-CM | POA: Insufficient documentation

## 2022-06-02 DIAGNOSIS — R42 Dizziness and giddiness: Secondary | ICD-10-CM

## 2022-06-02 DIAGNOSIS — E538 Deficiency of other specified B group vitamins: Secondary | ICD-10-CM

## 2022-06-02 DIAGNOSIS — K580 Irritable bowel syndrome with diarrhea: Secondary | ICD-10-CM | POA: Diagnosis not present

## 2022-06-02 MED ORDER — ONDANSETRON HCL 4 MG PO TABS: 4.0000 mg | ORAL_TABLET | Freq: Three times a day (TID) | ORAL | 0 refills | Status: DC | PRN

## 2022-06-02 MED ORDER — PANTOPRAZOLE SODIUM 40 MG PO TBEC: 40.0000 mg | DELAYED_RELEASE_TABLET | Freq: Every day | ORAL | 5 refills | Status: DC

## 2022-06-02 MED ORDER — CYANOCOBALAMIN 1000 MCG/ML IJ SOLN
1000.0000 ug | Freq: Once | INTRAMUSCULAR | Status: AC
Start: 2022-06-02 — End: 2022-06-02
  Administered 2022-06-02: 1000 ug via INTRAMUSCULAR

## 2022-06-02 NOTE — Progress Notes (Signed)
Subjective:    Patient ID: [name], female    DOB: [birth year], 82 y.o.   MRN: [id number]      HPI [name] is here for  Chief Complaint  Patient presents with   Nausea    For the last month, throughout day nausea, dizziness comes and goes, with no form of activity causing it     Not feeling well x 1 month.  Her imbalance has gotten worse.  Having nausea - mostly in the morning and dizziness ( spinning ) - that is all day.   She is unsure getting up and walking.  Her dizziness persists.  She has seen neurology, ENT and physical therapy without any improvement.  She is taking meclizine at night, but is not taking it during the day    IBS - is back.  Having diarrhea 4 times a day - not after she eats.     B12 today   Medications and allergies reviewed with patient and updated if appropriate.  Current Outpatient Medications on File Prior to Visit  Medication Sig Dispense Refill   acetaminophen (TYLENOL) 325 MG tablet Take 650 mg by mouth every 6 (six) hours as needed for moderate pain or headache.     albuterol (PROVENTIL HFA;VENTOLIN HFA) 108 (90 BASE) MCG/ACT inhaler Inhale 2 puffs into the lungs every 6 (six) hours as needed. For shortness of breath. 1 Inhaler 0   albuterol (PROVENTIL) (2.5 MG/3ML) 0.083% nebulizer solution USE 1 VIAL IN NEBULIZER 4 TIMES DAILY (Patient taking differently: Take 2.5 mg by nebulization every 6 (six) hours as needed for wheezing or shortness of breath.) 360 mL 1   aspirin EC 81 MG tablet Take 1 tablet (81 mg total) by mouth daily.     baclofen (LIORESAL) 20 MG tablet TAKE 2 TABLETS BY MOUTH TWICE A DAY 360 tablet 2   Bempedoic Acid-Ezetimibe (NEXLIZET) 180-10 MG TABS Take 1 tablet by mouth daily. 90 tablet 3   benzonatate (TESSALON) 200 MG capsule Take 1 capsule (200 mg total) by mouth 3 (three) times daily as needed for cough. 90 capsule 0   calcium carbonate (OS-CAL) 1250 (500 Ca) MG chewable tablet Chew 500 mg by mouth daily as needed for  heartburn.     Cholecalciferol (VITAMIN D) 50 MCG (2000 UT) tablet Take 4,000 Units by mouth daily.     Continuous Blood Gluc Receiver (FREESTYLE LIBRE 2 READER) DEVI 1 Device by Does not apply route every 14 (fourteen) days. 1 each 3   Continuous Blood Gluc Sensor (FREESTYLE LIBRE 2 SENSOR) MISC 1 Device by Does not apply route every 14 (fourteen) days. 6 each 3   Cyanocobalamin (VITAMIN B-12 IJ) Inject 1,000 mcg as directed every 30 (thirty) days.     diltiazem (CARDIZEM CD) 240 MG 24 hr capsule Take 1 capsule (240 mg total) by mouth daily. 90 capsule 2   divalproex (DEPAKOTE ER) 250 MG 24 hr tablet Take 1 tablet (250 mg total) by mouth at bedtime. 30 tablet 5   fluticasone (FLONASE) 50 MCG/ACT nasal spray SPRAY 2 SPRAYS INTO EACH NOSTRIL EVERY DAY 48 mL 3   Fluticasone-Umeclidin-Vilant (TRELEGY ELLIPTA) 200-62.5-25 MCG/ACT AEPB Inhale 1 puff into the lungs daily at 6 (six) AM. 1 each 0   glucose blood test strip Use as instructed to test blood sugars up to 4 times daily 200 each 12   insulin lispro protamine-lispro (HUMALOG 75/25 MIX) (75-25) 100 UNIT/ML SUSP injection Inject 18 Units into the skin 2 (two)  times daily with a meal. 10 units at lunch depending on bs reading (Patient taking differently: Inject 10-18 Units into the skin 3 (three) times daily with meals. 18 units at breakfast and dinner. 10 units at lunch depending on bs reading) 40 mL 3   Insulin Syringe-Needle U-100 (INSULIN SYRINGE .3CC/29GX1") 29G X 1" 0.3 ML MISC 1 Device by Does not apply route in the morning and at bedtime. 200 each 3   isosorbide mononitrate (IMDUR) 60 MG 24 hr tablet Take one tablet ( 60 mg ) twice daily 8 hours apart. 180 tablet 3   meclizine (ANTIVERT) 25 MG tablet Take 25 mg by mouth 2 (two) times daily as needed for dizziness.     metoprolol succinate (TOPROL-XL) 100 MG 24 hr tablet TAKE 1 TABLET BY MOUTH EVERY DAY 90 tablet 3   Multiple Vitamin (MULTIVITAMIN WITH MINERALS) TABS Take 1 tablet by mouth  daily.     nitroGLYCERIN (NITROSTAT) 0.4 MG SL tablet Place 1 tablet (0.4 mg total) under the tongue every 5 (five) minutes as needed for chest pain. 25 tablet 3   pantoprazole (PROTONIX) 40 MG tablet Take 1 tablet (40 mg total) by mouth daily. 30 tablet 0   spironolactone (ALDACTONE) 25 MG tablet TAKE 1 TABLET BY MOUTH EVERY DAY (Patient taking differently: Take 25 mg by mouth every evening.) 90 tablet 3   Study - ORION 4 - inclisiran 300 mg/1.41mL or placebo SQ injection (PI-Stuckey) Inject 300 mg into the skin every 6 (six) months.     temazepam (RESTORIL) 30 MG capsule Take 1 capsule (30 mg total) by mouth daily. 30 capsule 0   furosemide (LASIX) 40 MG tablet Take 1 tablet (40 mg total) by mouth daily. 30 tablet    No current facility-administered medications on file prior to visit.    Review of Systems  Gastrointestinal:  Positive for diarrhea and nausea. Negative for abdominal pain, blood in stool and constipation.       GERD  Musculoskeletal:  Positive for neck pain.  Neurological:  Positive for dizziness and headaches (posterior).       Objective:   Vitals:   06/02/22 1341  BP: 120/62  Temp: 98.7 F (37.1 C)   BP Readings from Last 3 Encounters:  06/02/22 120/62  04/07/22 (!) 140/80  04/05/22 (!) 142/74   Wt Readings from Last 3 Encounters:  06/02/22 169 lb 4 oz (76.8 kg)  04/07/22 165 lb (74.8 kg)  04/05/22 168 lb (76.2 kg)   Body mass index is 30.96 kg/m.    Physical Exam Constitutional:      General: She is not in acute distress.    Appearance: Normal appearance. She is not ill-appearing.  HENT:     Head: Normocephalic and atraumatic.  Abdominal:     General: There is no distension.     Palpations: Abdomen is soft.     Tenderness: There is no abdominal tenderness. There is no guarding or rebound.  Skin:    General: Skin is warm and dry.  Neurological:     Mental Status: She is alert.            Assessment & Plan:    See Problem List for  Assessment and Plan of chronic medical problems.

## 2022-06-02 NOTE — Assessment & Plan Note (Signed)
Chronic GERD is currently not controlled and could be causing some of her nausea Not currently taking the pantoprazole-states she ran out Restart pantoprazole 40 mg daily Zofran 4 mg every 8 hours as needed for nausea

## 2022-06-02 NOTE — Patient Instructions (Addendum)
     Medications changes include :   pantoprazole 40 mg daily and zofran as needed for nausea    A referral was ordered for [facility] in [place].     Someone will call you to schedule an appointment.  Start benefiber or metamucil daily and otc probiotics.  Keep drinking lots of water.       Return if symptoms worsen or fail to improve.

## 2022-06-02 NOTE — Assessment & Plan Note (Signed)
Acute States she was diagnosed with IBS years ago, but symptoms have recurred.  For the past 3 weeks she has been experiencing diarrhea-approximately 4 times a day Diarrhea is not associated with certain foods or eating Denies any abdominal pain She is experiencing some GERD No recent antibiotics, travel Advised to start Benefiber or Metamucil daily and start up over-the-counter probiotic Will also treat her GERD If no improvement she will call or return

## 2022-06-02 NOTE — Assessment & Plan Note (Signed)
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-did have vertigo intermittent for years, but now it is more persistent Has been nausea mostly in the morning Has true spinning sensation Balance is worse She is unsure about walking because of her poor balance and she is afraid of falling Has seen neurology, ENT and done vestibular PT without improvement Currently taking meclizine at night Discussed that I do not think I will be able to find a cause in the treatment and that she should see a different specialist-referral to [facility] vestibular center in [place] Can try meclizine twice a day-she is aware that this does cause drowsiness-advised that if it is not helping to stop taking it

## 2022-06-02 NOTE — Assessment & Plan Note (Signed)
Chronic Continue B12 injections monthly-she is not always compliant with getting them on a timely monthly basis B12 injection today 

## 2022-06-08 NOTE — Progress Notes (Addendum)
Anesthesia Review:  PCP: Cheryll Cockayne LOV 06/02/22 for dizziness + Cardiologist : Neuro- DR Shon Millet LVO 02/03/22  Pulm- DR Colbert Coyer Mannam LOV 04/05/22  Chest x-ray : 02/15/22- 2 view  EKG : 03/31/22  Echo : 11/17/21  Stress test: 02/20/20  Cardiac Cath :  07/28/21  Activity level:  Sleep Study/ CPAP : Fasting Blood Sugar :      / Checks Blood Sugar -- times a day:   Blood Thinner/ Instructions /Last Dose: ASA / Instructions/ Last Dose :   81 mg aspirin    [facility]hal preop done 03/31/22    PT in [facility] [place]-    DM- type      Freestyle Libre 2  Hgba1c-  Humalog 75/25

## 2022-06-08 NOTE — Patient Instructions (Signed)
SURGICAL WAITING ROOM VISITATION  Patients having surgery or a procedure may have no more than 2 support people in the waiting area - these visitors may rotate.    Children under the age of 34 must have an adult with them who is not the patient.  Due to an increase in RSV and influenza rates and associated hospitalizations, children ages 107 and under may not visit patients in [facility] hospitals.  If the patient needs to stay at the hospital during part of their recovery, the visitor guidelines for inpatient rooms apply. Pre-op nurse will coordinate an appropriate time for 1 support person to accompany patient in pre-op.  This support person may not rotate.    Please refer to the [facility] website for the visitor guidelines for Inpatients (after your surgery is over and you are in a regular room).       Your procedure is scheduled on:  06/25/2022    Report to [facility] Main Entrance    Report to admitting at   0800AM   Call this number if you have problems the morning of surgery [phone]   Do not eat food :After Midnight.   After Midnight you may have the following liquids until __  0730____ AM  DAY OF SURGERY  Water Non-Citrus Juices (without pulp, NO RED-Apple, White grape, White cranberry) Black Coffee (NO MILK/CREAM OR CREAMERS, sugar ok)  Clear Tea (NO MILK/CREAM OR CREAMERS, sugar ok) regular and decaf                             Plain Jell-O (NO RED)                                           Fruit ices (not with fruit pulp, NO RED)                                     Popsicles (NO RED)                                                               Sports drinks like Gatorade (NO RED)                     The day of surgery:  Drink ONE (1) Pre-Surgery Clear Ensure or G2 at  0730 AM  ( have completed by ) the morning of surgery. Drink in one sitting. Do not sip.  This drink was given to you during your hospital  pre-op appointment visit. Nothing else to  drink after completing the  Pre-Surgery Clear Ensure or G2.          If you have questions, please contact your surgeon's office.       Oral Hygiene is also important to reduce your risk of infection.                                    Remember - BRUSH YOUR TEETH THE MORNING  OF SURGERY WITH YOUR REGULAR TOOTHPASTE  DENTURES WILL BE REMOVED PRIOR TO SURGERY PLEASE DO NOT APPLY "Poly grip" OR ADHESIVES!!!   Do NOT smoke after Midnight   Take these medicines the morning of surgery with A SIP OF WATER:  inhalers as usual and bring, imdur, protonix, metoprolol       Humalog 75/25-   DO NOT TAKE ANY ORAL DIABETIC MEDICATIONS DAY OF YOUR SURGERY  Bring CPAP mask and tubing day of surgery.                              You may not have any metal on your body including hair pins, jewelry, and body piercing             Do not wear make-up, lotions, powders, perfumes/cologne, or deodorant  Do not wear nail polish including gel and S&S, artificial/acrylic nails, or any other type of covering on natural nails including finger and toenails. If you have artificial nails, gel coating, etc. that needs to be removed by a nail salon please have this removed prior to surgery or surgery may need to be canceled/ delayed if the surgeon/ anesthesia feels like they are unable to be safely monitored.   Do not shave  48 hours prior to surgery.               Men may shave face and neck.   Do not bring valuables to the hospital. [facility] NOT             RESPONSIBLE   FOR VALUABLES.   Contacts, glasses, dentures or bridgework may not be worn into surgery.   Bring small overnight bag day of surgery.   DO NOT BRING YOUR HOME MEDICATIONS TO THE HOSPITAL. PHARMACY WILL DISPENSE MEDICATIONS LISTED ON YOUR MEDICATION LIST TO YOU DURING YOUR ADMISSION IN THE HOSPITAL!    Patients discharged on the day of surgery will not be allowed to drive home.  Someone NEEDS to stay with you for the first 24 hours  after anesthesia.   Special Instructions: Bring a copy of your healthcare power of attorney and living will documents the day of surgery if you haven't scanned them before.              Please read over the following fact sheets you were given: IF YOU HAVE QUESTIONS ABOUT YOUR PRE-OP INSTRUCTIONS PLEASE CALL [phone]   If you received a COVID test during your pre-op visit  it is requested that you wear a mask when out in public, stay away from anyone that may not be feeling well and notify your surgeon if you develop symptoms. If you test positive for Covid or have been in contact with anyone that has tested positive in the last 10 days please notify you surgeon.    [place]- Preparing for Total Shoulder Arthroplasty    Before surgery, you can play an important role. Because skin is not sterile, your skin needs to be as free of germs as possible. You can reduce the number of germs on your skin by using the following products. Benzoyl Peroxide Gel Reduces the number of germs present on the skin Applied twice a day to shoulder area starting two days before surgery    ==================================================================  Please follow these instructions carefully:  BENZOYL PEROXIDE 5% GEL  Please do not use if you have an allergy to benzoyl peroxide.   If your skin  becomes reddened/irritated stop using the benzoyl peroxide.  Starting two days before surgery, apply as follows: Apply benzoyl peroxide in the morning and at night. Apply after taking a shower. If you are not taking a shower clean entire shoulder front, back, and side along with the armpit with a clean wet washcloth.  Place a quarter-sized dollop on your shoulder and rub in thoroughly, making sure to cover the front, back, and side of your shoulder, along with the armpit.   2 days before ____ AM   ____ PM              1 day before ____ AM   ____ PM                         Do this twice a day for two days.   (Last application is the night before surgery, AFTER using the CHG soap as described below).  Do NOT apply benzoyl peroxide gel on the day of surgery.

## 2022-06-09 ENCOUNTER — Other Ambulatory Visit: Payer: Self-pay

## 2022-06-09 ENCOUNTER — Encounter (HOSPITAL_COMMUNITY)
Admission: RE | Admit: 2022-06-09 | Discharge: 2022-06-09 | Disposition: A | Discharge status: Home / Self Care | Payer: Medicare Other | Source: Ambulatory Visit | Attending: Orthopedic Surgery | Admitting: Orthopedic Surgery

## 2022-06-09 VITALS — BP 142/70 | HR 62 | Temp 97.9°F | Resp 16 | Ht 62.0 in

## 2022-06-09 DIAGNOSIS — Z01818 Encounter for other preprocedural examination: Secondary | ICD-10-CM

## 2022-06-09 DIAGNOSIS — E119 Type 2 diabetes mellitus without complications: Secondary | ICD-10-CM | POA: Diagnosis not present

## 2022-06-09 LAB — CBC
HCT: 42.8 % (ref 36.0–46.0)
Hemoglobin: 12.9 g/dL (ref 12.0–15.0)
MCH: 26.1 pg (ref 26.0–34.0)
MCHC: 30.1 g/dL (ref 30.0–36.0)
MCV: 86.6 fL (ref 80.0–100.0)
Platelets: 269 10*3/uL (ref 150–400)
RBC: 4.94 MIL/uL (ref 3.87–5.11)
RDW: 14.6 % (ref 11.5–15.5)
WBC: 6.9 10*3/uL (ref 4.0–10.5)
nRBC: 0 % (ref 0.0–0.2)

## 2022-06-09 LAB — GLUCOSE, CAPILLARY
Glucose-Capillary: 112 mg/dL — ABNORMAL HIGH (ref 70–99)
Glucose-Capillary: 53 mg/dL — ABNORMAL LOW (ref 70–99)
Glucose-Capillary: 79 mg/dL (ref 70–99)

## 2022-06-09 LAB — SURGICAL PCR SCREEN
MRSA, PCR: NEGATIVE
Staphylococcus aureus: NEGATIVE

## 2022-06-09 LAB — HEMOGLOBIN A1C
Hgb A1c MFr Bld: 6.6 % — ABNORMAL HIGH (ref 4.8–5.6)
Mean Plasma Glucose: 142.72 mg/dL

## 2022-06-09 LAB — BASIC METABOLIC PANEL
Anion gap: 7 (ref 5–15)
BUN: 10 mg/dL (ref 8–23)
CO2: 32 mmol/L (ref 22–32)
Calcium: 9.5 mg/dL (ref 8.9–10.3)
Chloride: 101 mmol/L (ref 98–111)
Creatinine, Ser: 0.81 mg/dL (ref 0.44–1.00)
GFR, Estimated: >60 mL/min (ref >60)
Glucose, Bld: 56 mg/dL — ABNORMAL LOW (ref 70–99)
Potassium: 4.3 mmol/L (ref 3.5–5.1)
Sodium: 140 mmol/L (ref 135–145)

## 2022-06-09 NOTE — H&P (Signed)
Patient's anticipated LOS is less than 2 midnights, meeting these requirements: - Younger than 77 - Lives within 1 hour of care - Has a competent adult at home to recover with post-op recover - NO history of  - Chronic pain requiring opiods  - Diabetes  - Coronary Artery Disease  - Heart failure  - Heart attack  - Stroke  - DVT/VTE  - Cardiac arrhythmia  - Respiratory Failure/COPD  - Renal failure  - Anemia  - Advanced Liver disease     [name] is an 82 y.o. female.    Chief Complaint: left shoulder pain/weakness  HPI: Pt is a 82 y.o. female complaining of left shoulder pain for multiple years. Pain had continually increased since the beginning. X-rays in the clinic show end-stage arthritic changes of the left shoulder. Pt has tried various conservative treatments which have failed to alleviate their symptoms, including injections and therapy. Various options are discussed with the patient. Risks, benefits and expectations were discussed with the patient. Patient understand the risks, benefits and expectations and wishes to proceed with surgery.   PCP:  Pincus Sanes, MD  D/C Plans: Home  PMH: Past Medical History:  Diagnosis Date   Adenomatous colon polyp    Allergy    Anxiety    Arthritis    Asthma    Carotid artery disease (HCC)    carotid US 02/2017: bilat ICA 1-39%   Chronic diastolic CHF    Echo 02/2017: EF 65-70, Gr 2 DD, mild MS (mean 5), PASP 44   COPD    pt is unsure if has been officially diagnosed   Coronary artery disease    CABG '09- cathed 12/09, 9/10, 6/11, 3/14 and 12/13/16- medical Rx // cath 12/2016 - 2/3 grafts patent >> med Rx // Myoview 12/17: low risk   Diabetes mellitus    Dyspnea    with exertion   Gastroesophageal reflux disease    Headache    Hiatal hernia    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   Hyperlipidemia    Hypertension    Myocardial infarction (HCC)    PONV (postoperative nausea and vomiting)    Schatzki's ring     Shoulder injury    resolved after shoulder surgery   Sleep apnea    not on cpap    PSH: Past Surgical History:  Procedure Laterality Date   ABDOMINAL HYSTERECTOMY     APPENDECTOMY     came out with Hysterectomy   CARDIAC CATHETERIZATION  07/23/2009   EF 60%   CARDIAC CATHETERIZATION  10/11/2008   CARDIAC CATHETERIZATION  03/01/2007   EF 75-80%   CARDIOVASCULAR STRESS TEST  11/15/2007   EF 60%   COLONOSCOPY     CORONARY ARTERY BYPASS GRAFT     SEVERELY DISEASED SAPHENOUS VEIN GRAFT TO THE RIGHT CORONARY ARTERY BUT WITH FAIRLY WELL PRESERVED FLOW TO THE DISTAL RIGHT CORONARY ARTERY FROM THE NATIVE CIRCULATION-RESTART  CATH IN JUNE 2000, REVEALS MILD/MODERATE  CAD WITH GOOD FLOW DOWN HER LAD   ESOPHAGOGASTRODUODENOSCOPY     EYE SURGERY     bilateral cataract surgery with lens implant   LEFT HEART CATH AND CORS/GRAFTS ANGIOGRAPHY N/A 12/14/2016   Procedure: LEFT HEART CATH AND CORS/GRAFTS ANGIOGRAPHY;  Surgeon: Corky Crafts, MD;  Location: [facility] INVASIVE CV LAB;  Service: Cardiovascular;  Laterality: N/A;   lense removal Left    POLYPECTOMY     RIGHT/LEFT HEART CATH AND CORONARY/GRAFT ANGIOGRAPHY N/A 07/28/2021  Procedure: RIGHT/LEFT HEART CATH AND CORONARY/GRAFT ANGIOGRAPHY;  Surgeon: Lennette Bihari, MD;  Location: [facility] INVASIVE CV LAB;  Service: Cardiovascular;  Laterality: N/A;   ROTATOR CUFF REPAIR     right and left   TONSILLECTOMY     age 20   TOTAL KNEE ARTHROPLASTY Right 02/20/2014   Procedure: RIGHT TOTAL KNEE ARTHROPLASTY;  Surgeon: Jacki Cones, MD;  Location: [facility] OR[facility];  Service: Orthopedics;  Laterality: Right;   tumor removed kidney     UPPER GASTROINTESTINAL ENDOSCOPY     US ECHOCARDIOGRAPHY  03/08/2008   EF 55-60%    Social History:  reports that she quit smoking about 46 years ago. Her smoking use included cigarettes. She started smoking about 67 years ago. She has a 10.50 pack-year smoking history. She has never used smokeless tobacco. She reports that she  does not drink alcohol and does not use drugs. BMI: Estimated body mass index is 30.96 kg/m as calculated from the following:   Height as of 06/02/22: 5\' 2"  (1.575 m).   Weight as of 06/02/22: 76.8 kg.  Lab Results  Component Value Date   ALBUMIN 4.3 02/03/2022   Diabetes:   Patient has a diagnosis of diabetes,  Lab Results  Component Value Date   HGBA1C 6.5 (H) 03/31/2022   Smoking Status:      Allergies:  Allergies  Allergen Reactions   Amlodipine Other (See Comments)    hallucinations    Banana Nausea And Vomiting    Stomach pumped   Co Q10 [Coenzyme Q10] Other (See Comments)    Body cramps   Codeine Other (See Comments)    Hallucinate, loose identity and don't know who I am   Insulin Aspart Other (See Comments)    Unknown   Lisinopril Other (See Comments)    nose bleed   Metformin And Related Nausea And Vomiting   Morphine Other (See Comments)    Can not function, it immobilizes me    Pentazocine Nausea And Vomiting   Pravastatin Other (See Comments)    Hands locked up   Repatha [Evolocumab] Other (See Comments)    myalgias   Statins Other (See Comments)    Muscle cramps   Sulfa Antibiotics Swelling   Sulfonamide Derivatives Swelling   Tramadol Hcl Other (See Comments)    Dizziness, cant function     Medications: No current facility-administered medications for this encounter.   Current Outpatient Medications  Medication Sig Dispense Refill   acetaminophen (TYLENOL) 325 MG tablet Take 650 mg by mouth every 6 (six) hours as needed for moderate pain or headache.     albuterol (PROVENTIL HFA;VENTOLIN HFA) 108 (90 BASE) MCG/ACT inhaler Inhale 2 puffs into the lungs every 6 (six) hours as needed. For shortness of breath. 1 Inhaler 0   albuterol (PROVENTIL) (2.5 MG/3ML) 0.083% nebulizer solution USE 1 VIAL IN NEBULIZER 4 TIMES DAILY (Patient taking differently: Take 2.5 mg by nebulization every 6 (six) hours as needed for wheezing or shortness of breath.) 360 mL  1   aspirin EC 81 MG tablet Take 1 tablet (81 mg total) by mouth daily.     baclofen (LIORESAL) 20 MG tablet TAKE 2 TABLETS BY MOUTH TWICE A DAY 360 tablet 2   Bempedoic Acid-Ezetimibe (NEXLIZET) 180-10 MG TABS Take 1 tablet by mouth daily. 90 tablet 3   benzonatate (TESSALON) 200 MG capsule Take 1 capsule (200 mg total) by mouth 3 (three) times daily as needed for cough. 90 capsule 0   calcium carbonate (  TUMS - DOSED IN MG ELEMENTAL CALCIUM) 500 MG chewable tablet Chew 1 tablet by mouth daily as needed for indigestion or heartburn.     Cholecalciferol (VITAMIN D) 50 MCG (2000 UT) tablet Take 4,000 Units by mouth daily.     Cyanocobalamin (VITAMIN B-12 IJ) Inject 1,000 mcg as directed every 30 (thirty) days.     divalproex (DEPAKOTE ER) 250 MG 24 hr tablet Take 1 tablet (250 mg total) by mouth at bedtime. 30 tablet 5   fluticasone (FLONASE) 50 MCG/ACT nasal spray SPRAY 2 SPRAYS INTO EACH NOSTRIL EVERY DAY (Patient taking differently: Place 2 sprays into both nostrils daily as needed for allergies.) 48 mL 3   furosemide (LASIX) 40 MG tablet Take 1 tablet (40 mg total) by mouth daily. (Patient taking differently: Take 40 mg by mouth every other day.) 30 tablet    insulin lispro protamine-lispro (HUMALOG 75/25 MIX) (75-25) 100 UNIT/ML SUSP injection Inject 18 Units into the skin 2 (two) times daily with a meal. 10 units at lunch depending on bs reading (Patient taking differently: Inject 10-18 Units into the skin See admin instructions. Inject 20 units under the skin at breakfast and 16 units in the evening 10 units) 40 mL 3   meclizine (ANTIVERT) 25 MG tablet Take 25 mg by mouth 2 (two) times daily as needed for dizziness.     metoprolol succinate (TOPROL-XL) 100 MG 24 hr tablet TAKE 1 TABLET BY MOUTH EVERY DAY 90 tablet 3   Multiple Vitamin (MULTIVITAMIN WITH MINERALS) TABS Take 1 tablet by mouth daily.     nitroGLYCERIN (NITROSTAT) 0.4 MG SL tablet Place 1 tablet (0.4 mg total) under the tongue every  5 (five) minutes as needed for chest pain. 25 tablet 3   ondansetron (ZOFRAN) 4 MG tablet Take 1 tablet (4 mg total) by mouth every 8 (eight) hours as needed for nausea or vomiting. 20 tablet 0   pantoprazole (PROTONIX) 40 MG tablet Take 1 tablet (40 mg total) by mouth daily. Take 30 minutes prior to a meal 30 tablet 5   spironolactone (ALDACTONE) 25 MG tablet TAKE 1 TABLET BY MOUTH EVERY DAY (Patient taking differently: Take 25 mg by mouth every evening.) 90 tablet 3   temazepam (RESTORIL) 30 MG capsule Take 1 capsule (30 mg total) by mouth daily. (Patient taking differently: Take 30 mg by mouth at bedtime.) 30 capsule 0   Continuous Blood Gluc Receiver (FREESTYLE LIBRE 2 READER) DEVI 1 Device by Does not apply route every 14 (fourteen) days. 1 each 3   Continuous Blood Gluc Sensor (FREESTYLE LIBRE 2 SENSOR) MISC 1 Device by Does not apply route every 14 (fourteen) days. 6 each 3   diltiazem (CARDIZEM CD) 240 MG 24 hr capsule Take 1 capsule (240 mg total) by mouth daily. (Patient not taking: Reported on 06/07/2022) 90 capsule 2   Fluticasone-Umeclidin-Vilant (TRELEGY ELLIPTA) 200-62.5-25 MCG/ACT AEPB Inhale 1 puff into the lungs daily at 6 (six) AM. 1 each 0   glucose blood test strip Use as instructed to test blood sugars up to 4 times daily 200 each 12   Insulin Syringe-Needle U-100 (INSULIN SYRINGE .3CC/29GX1") 29G X 1" 0.3 ML MISC 1 Device by Does not apply route in the morning and at bedtime. 200 each 3   isosorbide mononitrate (IMDUR) 60 MG 24 hr tablet Take one tablet ( 60 mg ) twice daily 8 hours apart. 180 tablet 3   Study - ORION 4 - inclisiran 300 mg/1.68mL or placebo SQ injection (PI-Stuckey)  Inject 300 mg into the skin every 6 (six) months.      No results found. However, due to the size of the patient record, not all encounters were searched. Please check Results Review for a complete set of results. No results found.  ROS: Pain with rom of the left upper extremity  Physical  Exam: Alert and oriented 82 y.o. female in no acute distress Cranial nerves 2-12 intact Cervical spine: full rom with no tenderness, nv intact distally Chest: active breath sounds bilaterally, no wheeze rhonchi or rales Heart: regular rate and rhythm, no murmur Abd: non tender non distended with active bowel sounds Hip is stable with rom  Left shoulder painful and weak rom Nv intact distally No rashes or edema distally  Assessment/Plan Assessment: left shoulder cuff arthropathy  Plan:  Patient will undergo a left reverse total shoulder by Dr. Ranell Patrick at [place] Risks benefits and expectations were discussed with the patient. Patient understand risks, benefits and expectations and wishes to proceed. Preoperative templating of the joint replacement has been completed, documented, and submitted to the Operating Room personnel in order to optimize intra-operative equipment management.   Alphonsa Overall PA-C, MPAS [facility] is now [employer] [address], [place], [place] [address] Phone: [phone] [email or web address] Facebook  [employer]

## 2022-06-10 ENCOUNTER — Telehealth: Payer: Self-pay

## 2022-06-10 NOTE — Telephone Encounter (Signed)
Patient scheduled for tomorrow

## 2022-06-10 NOTE — Telephone Encounter (Signed)
Patient was at [facility] yesterday for a pre-op appointment and has BS dropped from 79 to 53. Last week EMS had to come out to see patient because sugar was low. I have attempted to contact patient on several times with no luck. I left a vm this morning for her to callback for appointment.  When I speak her would you like for her to come in today?

## 2022-06-10 NOTE — Progress Notes (Deleted)
Name: [name]  MRN/ DOB: [id number], Nov 21, 1940   Age/ Sex: 82 y.o., female    PCP: Pincus Sanes, MD   Reason for Endocrinology Evaluation: Type 1 Diabetes Mellitus     Date of Initial Endocrinology Visit: 12/18/2021    PATIENT IDENTIFIER: [name] is a 82 y.o. female with a past medical history of DM, CAD and OSA. The patient presented for initial endocrinology clinic visit on 12/18/2021  for consultative assistance with her diabetes management.    HPI: [name] was    Diagnosed with DM 1986 Prior Medications tried/Intolerance: Metformin- Vomiting  Hemoglobin A1c has ranged from 6.9% in 2009, peaking at 9.5% in 2023.   Used to follow-up with Dr. Talmage Nap   On her initial visit to our clinic her A1c was 6.9%, she was taking Humalog Mix 3 times daily, this was adjusted to twice daily  SUBJECTIVE:   During the last visit (03/30/2021): A1c 6.6%  Today (06/10/22): [name] is here for a follow up on diabetes management. She checks her blood sugars 3-4 times daily through the freestyle libre, did not bring the receiver today. The patient has  had hypoglycemic episodes since the last clinic visit.   Patient had an ED admission for hypothermia and hypoglycemia 12/20/2021, pt tells me that she took 70 units based on my recommendations ????  I did show the pt that my recommendations never included 70 units but rather 24 BID instead  of 50 units with breakfast ,15 units with lunch and 20 units with Supper like she was doing before she arrived here   She follows with pulmonology for COPD    HOME DIABETES REGIMEN: Humalog mix 20 units With breakfast,  and 15-18 units with Supper       METER DOWNLOAD SUMMARY: n/a  DIABETIC COMPLICATIONS: Microvascular complications:   Denies: CKD, retinopathy Last eye exam: Completed 11/2021  Macrovascular complications:  CAD Denies:  PVD, CVA   PAST HISTORY: Past Medical History:  Past Medical History:  Diagnosis  Date   Adenomatous colon polyp    Allergy    Anxiety    Arthritis    Asthma    Carotid artery disease (HCC)    carotid US 02/2017: bilat ICA 1-39%   Chronic diastolic CHF    Echo 02/2017: EF 65-70, Gr 2 DD, mild MS (mean 5), PASP 44   COPD    pt is unsure if has been officially diagnosed   Coronary artery disease    CABG '09- cathed 12/09, 9/10, 6/11, 3/14 and 12/13/16- medical Rx // cath 12/2016 - 2/3 grafts patent >> med Rx // Myoview 12/17: low risk   Diabetes mellitus    Dyspnea    with exertion   Gastroesophageal reflux disease    Headache    Hiatal hernia    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   Hyperlipidemia    Hypertension    Myocardial infarction (HCC)    PONV (postoperative nausea and vomiting)    Schatzki's ring    Shoulder injury    resolved after shoulder surgery   Sleep apnea    not on cpap   Past Surgical History:  Past Surgical History:  Procedure Laterality Date   ABDOMINAL HYSTERECTOMY     APPENDECTOMY     came out with Hysterectomy   CARDIAC CATHETERIZATION  07/23/2009   EF 60%   CARDIAC CATHETERIZATION  10/11/2008   CARDIAC CATHETERIZATION  03/01/2007   EF  75-80%   CARDIOVASCULAR STRESS TEST  11/15/2007   EF 60%   COLONOSCOPY     CORONARY ARTERY BYPASS GRAFT     SEVERELY DISEASED SAPHENOUS VEIN GRAFT TO THE RIGHT CORONARY ARTERY BUT WITH FAIRLY WELL PRESERVED FLOW TO THE DISTAL RIGHT CORONARY ARTERY FROM THE NATIVE CIRCULATION-RESTART  CATH IN JUNE 2000, REVEALS MILD/MODERATE  CAD WITH GOOD FLOW DOWN HER LAD   ESOPHAGOGASTRODUODENOSCOPY     EYE SURGERY     bilateral cataract surgery with lens implant   LEFT HEART CATH AND CORS/GRAFTS ANGIOGRAPHY N/A 12/14/2016   Procedure: LEFT HEART CATH AND CORS/GRAFTS ANGIOGRAPHY;  Surgeon: Corky Crafts, MD;  Location: [facility];  Service: Cardiovascular;  Laterality: N/A;   lense removal Left    POLYPECTOMY     RIGHT/LEFT HEART CATH AND CORONARY/GRAFT ANGIOGRAPHY N/A 07/28/2021    Procedure: RIGHT/LEFT HEART CATH AND CORONARY/GRAFT ANGIOGRAPHY;  Surgeon: Lennette Bihari, MD;  Location: [facility];  Service: Cardiovascular;  Laterality: N/A;   ROTATOR CUFF REPAIR     right and left   TONSILLECTOMY     age 38   TOTAL KNEE ARTHROPLASTY Right 02/20/2014   Procedure: RIGHT TOTAL KNEE ARTHROPLASTY;  Surgeon: Jacki Cones, MD;  Location: [facility];  Service: Orthopedics;  Laterality: Right;   tumor removed kidney     UPPER GASTROINTESTINAL ENDOSCOPY     US ECHOCARDIOGRAPHY  03/08/2008   EF 55-60%    Social History:  reports that she quit smoking about 46 years ago. Her smoking use included cigarettes. She started smoking about 67 years ago. She has a 10.50 pack-year smoking history. She has never used smokeless tobacco. She reports that she does not drink alcohol and does not use drugs. Family History:  Family History  Problem Relation Age of Onset   Heart disease Maternal Grandfather    Heart failure Maternal Grandfather    Diabetes Maternal Grandfather    Heart attack Father    Diabetes Mother    Rheum arthritis Sister    Emphysema Paternal Uncle    Esophageal cancer Brother 32       she said he was born with it   Emphysema Paternal Aunt    Healthy Child    Neuropathy Neg Hx    Multiple sclerosis Neg Hx    Colon cancer Neg Hx    Colon polyps Neg Hx    Rectal cancer Neg Hx    Stomach cancer Neg Hx      HOME MEDICATIONS: Allergies as of 06/11/2022       Reactions   Amlodipine Other (See Comments)   hallucinations    Banana Nausea And Vomiting   Stomach pumped   Co Q10 [coenzyme Q10] Other (See Comments)   Body cramps   Codeine Other (See Comments)   Hallucinate, loose identity and don't know who I am   Insulin Aspart Other (See Comments)   Unknown   Lisinopril Other (See Comments)   nose bleed   Metformin And Related Nausea And Vomiting   Morphine Other (See Comments)   Can not function, it immobilizes me    Pentazocine Nausea And Vomiting    Pravastatin Other (See Comments)   Hands locked up   Repatha [evolocumab] Other (See Comments)   myalgias   Statins Other (See Comments)   Muscle cramps   Sulfa Antibiotics Swelling   Sulfonamide Derivatives Swelling   Tramadol Hcl Other (See Comments)   Dizziness, cant function  Medication List        Accurate as of Jun 10, 2022  2:56 PM. If you have any questions, ask your nurse or doctor.          acetaminophen 325 MG tablet Commonly known as: TYLENOL Take 650 mg by mouth every 6 (six) hours as needed for moderate pain or headache.   albuterol 108 (90 Base) MCG/ACT inhaler Commonly known as: VENTOLIN HFA Inhale 2 puffs into the lungs every 6 (six) hours as needed. For shortness of breath. What changed: Another medication with the same name was changed. Make sure you understand how and when to take each.   albuterol (2.5 MG/3ML) 0.083% nebulizer solution Commonly known as: PROVENTIL USE 1 VIAL IN NEBULIZER 4 TIMES DAILY What changed: See the new instructions.   aspirin EC 81 MG tablet Take 1 tablet (81 mg total) by mouth daily.   baclofen 20 MG tablet Commonly known as: LIORESAL TAKE 2 TABLETS BY MOUTH TWICE A DAY   benzonatate 200 MG capsule Commonly known as: TESSALON Take 1 capsule (200 mg total) by mouth 3 (three) times daily as needed for cough.   calcium carbonate 500 MG chewable tablet Commonly known as: TUMS - dosed in mg elemental calcium Chew 1 tablet by mouth daily as needed for indigestion or heartburn.   diltiazem 240 MG 24 hr capsule Commonly known as: CARDIZEM CD Take 1 capsule (240 mg total) by mouth daily.   divalproex 250 MG 24 hr tablet Commonly known as: Depakote ER Take 1 tablet (250 mg total) by mouth at bedtime.   fluticasone 50 MCG/ACT nasal spray Commonly known as: FLONASE SPRAY 2 SPRAYS INTO EACH NOSTRIL EVERY DAY What changed: See the new instructions.   FreeStyle Libre 2 Reader Devi 1 Device by Does not apply  route every 14 (fourteen) days.   FreeStyle Libre 2 Sensor Misc 1 Device by Does not apply route every 14 (fourteen) days.   furosemide 40 MG tablet Commonly known as: LASIX Take 1 tablet (40 mg total) by mouth daily. What changed: when to take this   glucose blood test strip Use as instructed to test blood sugars up to 4 times daily   insulin lispro protamine-lispro (75-25) 100 UNIT/ML Susp injection Commonly known as: HUMALOG 75/25 MIX Inject 18 Units into the skin 2 (two) times daily with a meal. 10 units at lunch depending on bs reading What changed:  how much to take when to take this additional instructions   INSULIN SYRINGE .3CC/29GX1" 29G X 1" 0.3 ML Misc 1 Device by Does not apply route in the morning and at bedtime.   isosorbide mononitrate 60 MG 24 hr tablet Commonly known as: IMDUR Take one tablet ( 60 mg ) twice daily 8 hours apart.   meclizine 25 MG tablet Commonly known as: ANTIVERT Take 25 mg by mouth 2 (two) times daily as needed for dizziness.   metoprolol succinate 100 MG 24 hr tablet Commonly known as: TOPROL-XL TAKE 1 TABLET BY MOUTH EVERY DAY   multivitamin with minerals Tabs tablet Take 1 tablet by mouth daily.   Nexlizet 180-10 MG Tabs Generic drug: Bempedoic Acid-Ezetimibe Take 1 tablet by mouth daily.   nitroGLYCERIN 0.4 MG SL tablet Commonly known as: NITROSTAT Place 1 tablet (0.4 mg total) under the tongue every 5 (five) minutes as needed for chest pain.   ondansetron 4 MG tablet Commonly known as: Zofran Take 1 tablet (4 mg total) by mouth every 8 (eight) hours as needed  for nausea or vomiting.   ORION 4 inclisiran or placebo 300 mg/1.5 mL SQ injection Inject 300 mg into the skin every 6 (six) months.   pantoprazole 40 MG tablet Commonly known as: Protonix Take 1 tablet (40 mg total) by mouth daily. Take 30 minutes prior to a meal   spironolactone 25 MG tablet Commonly known as: ALDACTONE TAKE 1 TABLET BY MOUTH EVERY DAY What  changed: when to take this   temazepam 30 MG capsule Commonly known as: RESTORIL Take 1 capsule (30 mg total) by mouth daily. What changed: when to take this   Trelegy Ellipta 200-62.5-25 MCG/ACT Aepb Generic drug: Fluticasone-Umeclidin-Vilant Inhale 1 puff into the lungs daily at 6 (six) AM.   VITAMIN B-12 IJ Inject 1,000 mcg as directed every 30 (thirty) days.   Vitamin D 50 MCG (2000 UT) tablet Take 4,000 Units by mouth daily.         ALLERGIES: Allergies  Allergen Reactions   Amlodipine Other (See Comments)    hallucinations    Banana Nausea And Vomiting    Stomach pumped   Co Q10 [Coenzyme Q10] Other (See Comments)    Body cramps   Codeine Other (See Comments)    Hallucinate, loose identity and don't know who I am   Insulin Aspart Other (See Comments)    Unknown   Lisinopril Other (See Comments)    nose bleed   Metformin And Related Nausea And Vomiting   Morphine Other (See Comments)    Can not function, it immobilizes me    Pentazocine Nausea And Vomiting   Pravastatin Other (See Comments)    Hands locked up   Repatha [Evolocumab] Other (See Comments)    myalgias   Statins Other (See Comments)    Muscle cramps   Sulfa Antibiotics Swelling   Sulfonamide Derivatives Swelling   Tramadol Hcl Other (See Comments)    Dizziness, cant function      REVIEW OF SYSTEMS: A comprehensive ROS was conducted with the patient and is negative except as per HPI     OBJECTIVE:   VITAL SIGNS: LMP  (LMP Unknown)    PHYSICAL EXAM:  General: Pt appears well and is in NAD  Neck: General: Supple without adenopathy or carotid bruits. Thyroid: Thyroid size normal.  No goiter or nodules appreciated.   Lungs: Clear with good BS bilat with no rales, rhonchi, or wheezes  Heart: RRR   Extremities:  Lower extremities - No pretibial edema. No lesions.  Neuro: MS is good with appropriate affect, pt is alert and Ox3    DATA REVIEWED:  Lab Results  Component Value Date    HGBA1C 6.6 (H) 06/09/2022   HGBA1C 6.5 (H) 03/31/2022   HGBA1C 6.6 (A) 03/30/2022    Latest Reference Range & Units 02/03/22 11:02  Sodium 135 - 145 mEq/L 141  Potassium 3.5 - 5.1 mEq/L 4.1  Chloride 96 - 112 mEq/L 104  CO2 19 - 32 mEq/L 31  Glucose 70 - 99 mg/dL 161 (H)  BUN 6 - 23 mg/dL 15  Creatinine 0.96 - 0.45 mg/dL 4.09  Calcium 8.4 - 81.1 mg/dL 91.4  Alkaline Phosphatase 39 - 117 U/L 93  Albumin 3.5 - 5.2 g/dL 4.3  AST 0 - 37 U/L 16  ALT 0 - 35 U/L 9  Total Protein 6.0 - 8.3 g/dL 7.6  Total Bilirubin 0.2 - 1.2 mg/dL 0.6  GFR >78.29 mL/min 68.17    ASSESSMENT / PLAN / RECOMMENDATIONS:   1) Type 1 Diabetes  Mellitus, Optimaly controlled, With macrovascular complications - Most recent A1c of 6.6 %. Goal A1c < 7.5 %.     -[name] presented to the ED with hypoglycemia, she tells me this is because she took 70 units of insulin based on my advice? .  I did explain to the patient that the recommendation was for her to take 24 units twice daily which was a reduction from her prior dose, as I was concerned on her prior visit with me about her insulin use and how she was taking insulin makes 3 times daily rather than twice daily so I am not really sure how she heard 70 units, when  the after visit summary clearly indicated 24 units twice daily which was shown and right to the patient at that time.  Patient tells me that she does not recall having a paper with her? -Her prescription instructions also indicated 24 units twice daily so there is nowhere in the chart whether on her AVS or prescription that says 70 units  -We were unable to download her freestyle libre today because she did not bring the receiver -But based on memory recall I have advised her to reduce her breakfast dose and increase her supper dose as below -She is intolerant to metformin -She cannot tolerate oral agents as it causes vomiting -I have again advised the patient of switching her insulin to pens rather than  vials due better  safety profile and more precise dosing, but she declines due to cost issues  MEDICATIONS: Decrease  Humalog mix to 18 units before breakfast and 18 units before supper  EDUCATION / INSTRUCTIONS: BG monitoring instructions: Patient is instructed to check her blood sugars 3 times a day, before each meal. Call [facility] if: BG persistently < 70  I reviewed the Rule of 15 for the treatment of hypoglycemia in detail with the patient. Literature supplied.   2) Diabetic complications:  Eye: Does not have known diabetic retinopathy.  Neuro/ Feet: Does not have known diabetic peripheral neuropathy. Renal: Patient does not have known baseline CKD. She is not on an ACEI/ARB at present.    Follow-up in 6 months   Signed electronically by: Lyndle Herrlich, MD  [facility]  [facility] [address] [place], [place] [address] Phone: [phone] FAX: [phone]   CC: Pincus Sanes, MD [address] [place] [address] Phone: [phone]  Fax: [phone]    Return to Endocrinology clinic as below: Future Appointments  Date Time Provider Department Center  06/11/2022 11:50 AM Cameron Katayama, Konrad Dolores, MD [facility] None  06/22/2022  9:30 AM [facility] None  06/28/2022  1:50 PM Drema Dallas, DO [facility] None  10/05/2022 11:10 AM Thelbert Gartin, Konrad Dolores, MD [facility] None  10/06/2022  1:20 PM Pincus Sanes, MD [facility] None  10/07/2022  9:10 AM Lawerance Bach, Bobette Mo, MD [facility] None

## 2022-06-10 NOTE — Telephone Encounter (Signed)
Attempted to contact patient again no answer.  

## 2022-06-11 ENCOUNTER — Ambulatory Visit: Payer: Medicare Other | Admitting: Internal Medicine

## 2022-06-16 NOTE — Progress Notes (Signed)
Case: [id number] Date/Time: 06/25/22 0845   Procedure: REVERSE SHOULDER ARTHROPLASTY (Left: Shoulder) - general and choice with interscalene block   Anesthesia type: General   Pre-op diagnosis: left shoulder osteoarthritis   Location: [facility] ROOM 07 / [facility]   Surgeons: Beverely Low, MD       DISCUSSION: [name] is an 82 year old female who presents to PAT prior to left shoulder surgery.  Patient was previously scheduled for surgery on 04/09/22 however it was canceled due to patient having acute diarrheal illness.  Surgery is now rescheduled for 5/17. Prior anesthesia complications include PONV. Past medical history is significant for former smoker, severe asthma, COPD, nocturnal hypoxia (not on oxygen), coronary artery disease s/p CABG (2009), chronic diastolic CHF, carotid artery disease, HTN, type 1 diabetes, GERD.  Patient last saw cardiology on 10/30/21. Per Dr. Elease Hashimoto at that visit: "She needs to have shoulder replacement. She is at low risk for her upcoming shoulder replacement surgery. Ok for her to hold her ASA for 5-7 days prior to surgery."  Pre op risk assessment by Cardiology was done via telephone visit on 06/23/22:  "Preoperative Cardiovascular Risk Assessment: According to the Revised Cardiac Risk Index (RCRI), her Perioperative Risk of Major Cardiac Event is (%): 11. Her Functional Capacity in METs is: 4.4 according to the Duke Activity Status Index (DASI). The patient was advised that if she develops new symptoms prior to surgery to contact our office to arrange for a follow-up visit, and she verbalized understanding. Per office protocol, he may hold aspirin for 7 days prior to procedure and should resume as soon as hemodynamically stable postoperatively."   Additionally patient received clearance from her Pulmonologist prior to her original surgery date on 04/05/22:  "Preop evaluation for shoulder surgery Peri-operative Assessment of Pulmonary Risk for Non-Thoracic  Surgery: Her breathing status appears reasonably well-controlled with no wheezing on examination.  She is overall low risk for undergoing planned procedure. For Ms. Neddo, risk of perioperative pulmonary complications is increased by:             Age greater than 65 years             COPD, asthma Respiratory complications generally occur in 1% of ASA Class I patients, 5% of ASA Class II and 10% of ASA Class III-IV patients These complications rarely result in mortality and iclude postoperative pneumonia, atelectasis, pulmonary embolism, ARDS and increased time requiring postoperative mechanical ventilation. Overall, I recommend proceeding with the surgery if the risk for respiratory complications are outweighed by the potential benefits. This will need to be discussed between the patient and surgeon. To reduce risks of respiratory complications, I recommend: --Pre- and post-operative incentive spirometry performed frequently while awake --Avoiding use of pancuronium during anesthesia."  Of note patient was hypoglycemic at PAT visit with glucose as low as 53 and patient was symptomatic. Patient contacted endocrinologist on 5/2 and they responded: "No need for an office visit, she just needs to reduce her insulin further from 18 units twice daily to 14 units twice daily.  She needs to make sure that she takes her insulin within 5 minutes of eating breakfast and within 5 minutes of eating supper "    VS: LMP  (LMP Unknown)   PROVIDERS: Pincus Sanes, MD Endocrine: Terrace Arabia, MD Cardiology: Kristeen Miss, MD Pulmonology: Chilton Greathouse, MD  LABS: Labs reviewed: Repeat glucose on DOS (all labs ordered are listed, but only abnormal results are displayed)  Labs Reviewed - No data to display  IMAGES:  CXR 02/15/22:  FINDINGS: The heart size and mediastinal contours are stable. Both lungs are clear. The visualized skeletal structures are stable.   IMPRESSION: No active  cardiopulmonary disease.    EKG 03/31/22  Sinus bradycardia RBBB Similar to prior  CV:  Echo 11/17/21:  IMPRESSIONS     1. Left ventricular ejection fraction, by estimation, is >75%. The left  ventricle has hyperdynamic function. The left ventricle has no regional  wall motion abnormalities. Left ventricular diastolic function could not  be evaluated.   2. Right ventricular systolic function is normal. The right ventricular  size is normal. Tricuspid regurgitation signal is inadequate for assessing  PA pressure.   3. Left atrial size was mildly dilated.   4. The mitral valve is normal in structure. No evidence of mitral valve  regurgitation. Mild mitral stenosis. Severe mitral annular calcification.   5. The aortic valve is tricuspid. Aortic valve regurgitation is not  visualized. Aortic valve sclerosis/calcification is present, without any  evidence of aortic stenosis.   6. The inferior vena cava is normal in size with greater than 50%  respiratory variability, suggesting right atrial pressure of 3 mmHg.   Per Dr. Elease Hashimoto: "Hyperdynamic left ventricular function.  EF is greater than 75%.  Diastolic function could not be evaluated. She is at low risk for her upcoming shoulder surgery."    Left/Right heart cath 07/28/21:    Suezanne Jacquet LAD to Prox LAD lesion is 60% stenosed.   1st Diag lesion is 100% stenosed.   Origin to Prox Graft lesion is 100% stenosed.   Prox RCA lesion is 25% stenosed.   Mid RCA lesion is 25% stenosed.   LV end diastolic pressure is normal.   The left ventricular ejection fraction is 55-65% by visual estimate.   The left main coronary artery is a long normal caliber vessel which bifurcates into an LAD and large left circumflex vessel.   The LAD has smooth 60% narrowing proximally.  There is no antegrade filling of the first diagonal vessel which had previously been felt to be occluded at the ostium.  Competitive filling is seen in the mid LAD due to  competitive LIMA flow.   Left circumflex vessel is very large caliber and gives off a ramus intermediate like very proximal vessel.  The circumflex is tortuous but free of significant disease.   Native RCA is large caliber and has previously noted 25% proximal and mid 6 stenoses.  There is no competitive filling seen by the SVG.   Patent LIMA graft supplying the mid LAD   Widely patent SVG supplying the first diagonal branch of the LAD   Old total occlusion of the vein graft which had supplied the PDA.   Preserved global LV contractility with EF estimated at 60%.  LVEDP 16 mmHg.   Mildly elevated right heart pressures with mild pulmonary hypertension with mean PA pressure at 25 mm.   Cardiac output by the thermodilution method 3.5 and by the Fick method 4.9 L/min. Cardiac index by thermodilution method 1.9 and by the Fick method 2.7 L/min/m.   PVR: 3.0 WU   RECOMMENDATION: Medical therapy.  The catheterization findings are not significantly changed from the patient's last catheterization in 2018.  The SVG to RCA was occluded in 2018 most likely due to normal TIMI-3 flow down the native RCA.  The proximal LAD is smooth and seems improved.  It is possible that the first diagonal is not occluded at the ostium since there is  excellent filling to the ostium via the SVG.   Carotid US 02/18/2017:  Final Interpretation:  Right Carotid: There is evidence in the right ICA of a 1-39% stenosis.   Left Carotid: There is evidence in the left ICA of a 1-39% stenosis.   Vertebrals:  Both vertebral arteries were patent with antegrade flow.  Subclavians: Normal flow hemodynamics were seen in bilateral subclavian               arteries.   Past Medical History:  Diagnosis Date   Adenomatous colon polyp    Allergy    Anxiety    Arthritis    Asthma    Carotid artery disease (HCC)    carotid US 02/2017: bilat ICA 1-39%   Chronic diastolic CHF    Echo 02/2017: EF 65-70, Gr 2 DD, mild MS (mean 5),  PASP 44   COPD    pt is unsure if has been officially diagnosed   Coronary artery disease    CABG '09- cathed 12/09, 9/10, 6/11, 3/14 and 12/13/16- medical Rx // cath 12/2016 - 2/3 grafts patent >> med Rx // Myoview 12/17: low risk   Diabetes mellitus    Dyspnea    with exertion   Gastroesophageal reflux disease    Headache    Hiatal hernia    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   Hyperlipidemia    Hypertension    Myocardial infarction (HCC)    PONV (postoperative nausea and vomiting)    Schatzki's ring    Shoulder injury    resolved after shoulder surgery   Sleep apnea    not on cpap    Past Surgical History:  Procedure Laterality Date   ABDOMINAL HYSTERECTOMY     APPENDECTOMY     came out with Hysterectomy   CARDIAC CATHETERIZATION  07/23/2009   EF 60%   CARDIAC CATHETERIZATION  10/11/2008   CARDIAC CATHETERIZATION  03/01/2007   EF 75-80%   CARDIOVASCULAR STRESS TEST  11/15/2007   EF 60%   COLONOSCOPY     CORONARY ARTERY BYPASS GRAFT     SEVERELY DISEASED SAPHENOUS VEIN GRAFT TO THE RIGHT CORONARY ARTERY BUT WITH FAIRLY WELL PRESERVED FLOW TO THE DISTAL RIGHT CORONARY ARTERY FROM THE NATIVE CIRCULATION-RESTART  CATH IN JUNE 2000, REVEALS MILD/MODERATE  CAD WITH GOOD FLOW DOWN HER LAD   ESOPHAGOGASTRODUODENOSCOPY     EYE SURGERY     bilateral cataract surgery with lens implant   LEFT HEART CATH AND CORS/GRAFTS ANGIOGRAPHY N/A 12/14/2016   Procedure: LEFT HEART CATH AND CORS/GRAFTS ANGIOGRAPHY;  Surgeon: Corky Crafts, MD;  Location: [facility];  Service: Cardiovascular;  Laterality: N/A;   lense removal Left    POLYPECTOMY     RIGHT/LEFT HEART CATH AND CORONARY/GRAFT ANGIOGRAPHY N/A 07/28/2021   Procedure: RIGHT/LEFT HEART CATH AND CORONARY/GRAFT ANGIOGRAPHY;  Surgeon: Lennette Bihari, MD;  Location: [facility];  Service: Cardiovascular;  Laterality: N/A;   ROTATOR CUFF REPAIR     right and left   TONSILLECTOMY     age 54   TOTAL KNEE  ARTHROPLASTY Right 02/20/2014   Procedure: RIGHT TOTAL KNEE ARTHROPLASTY;  Surgeon: Jacki Cones, MD;  Location: [facility];  Service: Orthopedics;  Laterality: Right;   tumor removed kidney     UPPER GASTROINTESTINAL ENDOSCOPY     US ECHOCARDIOGRAPHY  03/08/2008   EF 55-60%    MEDICATIONS: No current facility-administered medications for this encounter.    acetaminophen (  TYLENOL) 325 MG tablet   albuterol (PROVENTIL HFA;VENTOLIN HFA) 108 (90 BASE) MCG/ACT inhaler   albuterol (PROVENTIL) (2.5 MG/3ML) 0.083% nebulizer solution   aspirin EC 81 MG tablet   baclofen (LIORESAL) 20 MG tablet   Bempedoic Acid-Ezetimibe (NEXLIZET) 180-10 MG TABS   benzonatate (TESSALON) 200 MG capsule   calcium carbonate (TUMS - DOSED IN MG ELEMENTAL CALCIUM) 500 MG chewable tablet   Cholecalciferol (VITAMIN D) 50 MCG (2000 UT) tablet   Cyanocobalamin (VITAMIN B-12 IJ)   divalproex (DEPAKOTE ER) 250 MG 24 hr tablet   fluticasone (FLONASE) 50 MCG/ACT nasal spray   furosemide (LASIX) 40 MG tablet   insulin lispro protamine-lispro (HUMALOG 75/25 MIX) (75-25) 100 UNIT/ML SUSP injection   meclizine (ANTIVERT) 25 MG tablet   metoprolol succinate (TOPROL-XL) 100 MG 24 hr tablet   Multiple Vitamin (MULTIVITAMIN WITH MINERALS) TABS   nitroGLYCERIN (NITROSTAT) 0.4 MG SL tablet   ondansetron (ZOFRAN) 4 MG tablet   pantoprazole (PROTONIX) 40 MG tablet   spironolactone (ALDACTONE) 25 MG tablet   temazepam (RESTORIL) 30 MG capsule   Continuous Blood Gluc Receiver (FREESTYLE LIBRE 2 READER) DEVI   Continuous Blood Gluc Sensor (FREESTYLE LIBRE 2 SENSOR) MISC   diltiazem (CARDIZEM CD) 240 MG 24 hr capsule   Fluticasone-Umeclidin-Vilant (TRELEGY ELLIPTA) 200-62.5-25 MCG/ACT AEPB   glucose blood test strip   Insulin Syringe-Needle U-100 (INSULIN SYRINGE .3CC/29GX1") 29G X 1" 0.3 ML MISC   isosorbide mononitrate (IMDUR) 60 MG 24 hr tablet   Study - ORION 4 - inclisiran 300 mg/1.90mL or placebo SQ injection (PI-Stuckey)    Ubaldo Glassing, PA-C [facility] Short Stay/Anesthesiology [facility] Phone [phone] 06/24/2022 9:53 AM

## 2022-06-16 NOTE — Telephone Encounter (Signed)
Phone line busy X 2 will try later.

## 2022-06-16 NOTE — Telephone Encounter (Signed)
   Pre-operative Risk Assessment    Patient Name: [name]  DOB: 18-Oct-1940 MRN: [id number]      Request for Surgical Clearance    Procedure:   Left Reverse total Shoulder  Date of Surgery:  Clearance 06/25/22                                 Surgeon:  Dr. Malon Kindle Surgeon's Group or Practice Name:  [facility] Phone number:  [phone] Fax number:  [phone]   Type of Clearance Requested:   - Medical  - Pharmacy:  Hold Aspirin Not Indicated   Type of Anesthesia:   Choice   Additional requests/questions: Looks like this clearance was taken care of in March.   Signed, Emmit Pomfret   06/16/2022, 3:19 PM

## 2022-06-16 NOTE — Telephone Encounter (Signed)
   Name: [name]  DOB: 1940/12/19  MRN: [id number]  Primary Cardiologist: Kristeen Miss, MD   Preoperative team, please contact this patient and set up a phone call appointment for further preoperative risk assessment. Please obtain consent and complete medication review. Thank you for your help.  Patient was cleared for surgery in March, however it appears her surgery was postponed. If requesting physician sent in new clearance request, patient will need a virtual visit to ensure nothing has changed since she was last seen.  I confirm that guidance regarding antiplatelet and oral anticoagulation therapy has been completed and, if necessary, noted below.  Ideally aspirin should be continued without interruption, however if the bleeding risk is too great, aspirin may be held for 5-7 days prior to surgery. Please resume aspirin post operatively when it is felt to be safe from a bleeding standpoint.     Carlos Levering, NP 06/16/2022, 3:58 PM [facility]

## 2022-06-17 NOTE — Telephone Encounter (Signed)
Attempted to reach pt, phone rang busy.

## 2022-06-21 ENCOUNTER — Telehealth: Payer: Self-pay | Admitting: *Deleted

## 2022-06-21 NOTE — Telephone Encounter (Signed)
See previous notes. Left message to call back to schedule a tele pre op appt. Looks like surgery is planned for 06/25/22.

## 2022-06-21 NOTE — Telephone Encounter (Signed)
Left message for patient to call back  Can we reschedule appt for earlier in the morning 06/22/2022 or reschedule appt to different day  Patient window for appt is open till June 7th.  Fasting labs and IDS to dispense.   Mercer Pod :) RN BSN  Clinical Research Nurse  Be strong and take heart, all you who hope in the [place]. ~ Psalm 31:24

## 2022-06-22 ENCOUNTER — Encounter: Payer: Medicare Other | Admitting: *Deleted

## 2022-06-22 DIAGNOSIS — Z006 Encounter for examination for normal comparison and control in clinical research program: Secondary | ICD-10-CM

## 2022-06-22 MED ORDER — STUDY - ORION 4 - INCLISIRAN 300 MG/1.5 ML OR PLACEBO SQ INJECTION (PI-STUCKEY)
300.0000 mg | INJECTION | SUBCUTANEOUS | Status: DC
  Administered 2022-06-22: 300 mg via SUBCUTANEOUS
  Filled 2022-06-22: qty 1.5

## 2022-06-22 NOTE — Telephone Encounter (Signed)
Surgery scheduler sent me a secure chat asking about clearance for this pt. I explained that we have been trying to reach the pt to set up a tele pre op appt. Pt did call our office back though pre op staff was busy. We have tried a number of times to reach the pt again and she has not returned calls and left messages.

## 2022-06-22 NOTE — Research (Signed)
Orion 4  Doing well No AEs to report.  No urgent care or er visit or hospitalizations to report.  Patient confirmed her meds with me  Injection given @ 1015 Left lower abdomen  Fasting labs drawn at 0945.    Will see patient back 12/21/2022 @ 1100

## 2022-06-23 ENCOUNTER — Ambulatory Visit: Payer: Medicare Other | Attending: Nurse Practitioner | Admitting: Nurse Practitioner

## 2022-06-23 ENCOUNTER — Telehealth: Payer: Self-pay | Admitting: *Deleted

## 2022-06-23 ENCOUNTER — Encounter: Payer: Self-pay | Admitting: Nurse Practitioner

## 2022-06-23 DIAGNOSIS — Z01810 Encounter for preprocedural cardiovascular examination: Secondary | ICD-10-CM | POA: Diagnosis not present

## 2022-06-23 NOTE — Telephone Encounter (Signed)
Scheduled pt today for tele clearance due to pt having surgery May 17.     Patient Consent for Virtual Visit         [name] has provided verbal consent on 06/23/2022 for a virtual visit (video or telephone).   CONSENT FOR VIRTUAL VISIT FOR:  [name]  By participating in this virtual visit I agree to the following:  I hereby voluntarily request, consent and authorize [facility] and its employed or contracted physicians, physician assistants, nurse practitioners or other licensed health care professionals (the Practitioner), to provide me with telemedicine health care services (the "Services") as deemed necessary by the treating Practitioner. I acknowledge and consent to receive the Services by the Practitioner via telemedicine. I understand that the telemedicine visit will involve communicating with the Practitioner through live audiovisual communication technology and the disclosure of certain medical information by electronic transmission. I acknowledge that I have been given the opportunity to request an in-person assessment or other available alternative prior to the telemedicine visit and am voluntarily participating in the telemedicine visit.  I understand that I have the right to withhold or withdraw my consent to the use of telemedicine in the course of my care at any time, without affecting my right to future care or treatment, and that the Practitioner or I may terminate the telemedicine visit at any time. I understand that I have the right to inspect all information obtained and/or recorded in the course of the telemedicine visit and may receive copies of available information for a reasonable fee.  I understand that some of the potential risks of receiving the Services via telemedicine include:  Delay or interruption in medical evaluation due to technological equipment failure or disruption; Information transmitted may not be sufficient (e.g. poor resolution of  images) to allow for appropriate medical decision making by the Practitioner; and/or  In rare instances, security protocols could fail, causing a breach of personal health information.  Furthermore, I acknowledge that it is my responsibility to provide information about my medical history, conditions and care that is complete and accurate to the best of my ability. I acknowledge that Practitioner's advice, recommendations, and/or decision may be based on factors not within their control, such as incomplete or inaccurate data provided by me or distortions of diagnostic images or specimens that may result from electronic transmissions. I understand that the practice of medicine is not an exact science and that Practitioner makes no warranties or guarantees regarding treatment outcomes. I acknowledge that a copy of this consent can be made available to me via my patient portal [facility] MyChart), or I can request a printed copy by calling the office of [facility].    I understand that my insurance will be billed for this visit.   I have read or had this consent read to me. I understand the contents of this consent, which adequately explains the benefits and risks of the Services being provided via telemedicine.  I have been provided ample opportunity to ask questions regarding this consent and the Services and have had my questions answered to my satisfaction. I give my informed consent for the services to be provided through the use of telemedicine in my medical care

## 2022-06-23 NOTE — Progress Notes (Deleted)
NEUROLOGY FOLLOW UP OFFICE NOTE  [name] [id number]  Assessment/Plan:   Chronic daily headache   DIvalproex ER 250mg  QHS *** Limit use of pain relievers to no more than 2 days out of week to prevent risk of rebound or medication-overuse headache. ***   Subjective:  [name] is a 82 year old woman with coronary artery disease, hypertension, type 1 diabetes mellitus, hyperlipidemia, B12 deficiency and obstructive sleep apnea and tension-type headache who follows up for dizziness and headache.   UPDATE: Started Depakote.  *** ***  Intensity:  Moderate to severe *** Duration:  Wakes up at 5 AM with headache.  Takes Tylenol and resolves by 11 AM.  However, it returns in the evening. *** Frequency:  Daily *** Frequency of abortive medication: Tylenol every 4 hours daily Current NSAIDS:  ASA 81mg  Current analgesics:  Tylenol Current triptans:  no Current ergotamine:  no Current anti-emetic:  Promethazine 25mg  Current muscle relaxants:  Baclofen 40mg  twice daily Current anti-anxiolytic:  Klonopin Current sleep aide:  trazodone Current Antihypertensive medications:  Toprol XL 50mg , Cardizem, Lasix, Imdur Current Antidepressant medications:  none Current Anticonvulsant medications:  Depakote ER 250mg  QHS Current anti-CGRP:  no Current Vitamins/Herbal/Supplements:  B12 Current Antihistamines/Decongestants:  Allegra, Singulair Other therapy:  ice pack   Caffeine:  1 cup coffee daily, tea Alcohol:  no Smoker:  no Diet:  hydrates Exercise:  yes Depression:  no; Anxiety:  no Other pain:  no Sleep hygiene:  Poor.  Untreated OSA.   She was taken off of CPAP by her prior sleep specialist because of reported noncompliance.  However, [name] insists she wore the mask all night and headaches were improved while she used the machine.  02/03/2022 LABS:  T bili 0.6, ALP 93, AST 16, ALT 9 06/09/2022 LABS:  WBC 6.9, HGB 12.9, HCT 42.8, PLT 269      HISTORY: Onset:  She  has history of migraines as a young woman which resolved after having children.  Current headaches started in 2016. Location:  She has constant holocephalic headache.  She has severe headache radiating from the base of her neck on the right up Quality:  pounding Initial Intensity:  10/10 Aura:  no Prodrome:  no Postdrome:  no Associated symptoms:  None.  No nausea, vomiting, photophobia, phonophobia or visual disturbance.  She has not had any new worse headache of her life, waking up from sleep Initial Duration:  All day Initial Frequency:  Daily headache, severe  Headache every other day Initial Frequency of abortive medication: daily Triggers:  None Relieving factors:  None Activity:  aggravates   Past NSAIDS:  Ibuprofen, naproxen (caused increased heart rate) Past analgesics:  Excedrin, tramadol and other opioids (adverse reaction) Past abortive triptans:  no Past muscle relaxants:  Robaxin, Flexeril Past anti-emetic:  Promethazine (effective) Past antihypertensive medications:  losartan Past antidepressant medications:  nortriptyline (prolonged QT interval) Past anticonvulsant medications:  Topiramate (suppressed appetite), abapentin 200mg  (drowsiness) Past vitamins/Herbal/Supplements:  CoQ10 Past antihistamines/decongestants:  no Other past therapy:  Physical therapy of neck.   Family history of headache:  father   History of vertigo.  Seen in ED in July 2022.  MRI of brain revealed empty sella but otherwise no acute intracranial abnormality.  Occurs while walking or standing.  Not associated with quick head movements or turning over in bed.  Went to vestibular rehab which was ineffective.    MRI of brain without contrast from 09/16/14 revealed partially empty sella but otherwise unremarkable.   MRI  of cervical spine from 09/16/14 demonstrated multilevel degenerative changes including left greater than right spurring at C3-C4 causing left foraminal narrowing, severe spurring at  C4-C5 causing right moderate foraminal narrowing, and disc bulg and spurring at C6-C7 with moderate bilateral foraminal narrowing.  She was evaluated by Dr. Ethelene Hal for interventional pain (epidural or facet joint injections).  He didn't think it was a good idea given that she would have to stop Plavix.  PAST MEDICAL HISTORY: Past Medical History:  Diagnosis Date   Adenomatous colon polyp    Allergy    Anxiety    Arthritis    Asthma    Carotid artery disease (HCC)    carotid US 02/2017: bilat ICA 1-39%   Chronic diastolic CHF    Echo 02/2017: EF 65-70, Gr 2 DD, mild MS (mean 5), PASP 44   COPD    pt is unsure if has been officially diagnosed   Coronary artery disease    CABG '09- cathed 12/09, 9/10, 6/11, 3/14 and 12/13/16- medical Rx // cath 12/2016 - 2/3 grafts patent >> med Rx // Myoview 12/17: low risk   Diabetes mellitus    Dyspnea    with exertion   Gastroesophageal reflux disease    Headache    Hiatal hernia    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     albuterol (PROVENTIL HFA;VENTOLIN HFA) 108 (90 BASE) MCG/ACT inhaler Inhale 2 puffs into the lungs every 6 (six) hours as needed. For shortness of breath. 1 Inhaler 0   albuterol (PROVENTIL) (2.5 MG/3ML) 0.083% nebulizer solution USE 1 VIAL IN NEBULIZER 4 TIMES DAILY (Patient taking differently: Take 2.5 mg by nebulization every 6 (six) hours as needed for wheezing or shortness of breath.) 360 mL 1   aspirin EC 81 MG tablet Take 1 tablet (81 mg total) by mouth daily.     baclofen (LIORESAL)  20 MG tablet TAKE 2 TABLETS BY MOUTH TWICE A DAY 360 tablet 2   Bempedoic Acid-Ezetimibe (NEXLIZET) 180-10 MG TABS Take 1 tablet by mouth daily. 90 tablet 3   benzonatate (TESSALON) 200 MG capsule Take 1 capsule (200 mg total) by mouth 3 (three) times daily as needed for cough. 90 capsule 0   calcium carbonate (TUMS - DOSED IN MG ELEMENTAL CALCIUM) 500 MG chewable tablet Chew 1 tablet by mouth daily as needed for indigestion or heartburn.     Cholecalciferol (VITAMIN D) 50 MCG (2000 UT) tablet Take 4,000 Units by mouth daily.     Continuous Blood Gluc Receiver (FREESTYLE LIBRE 2 READER) DEVI 1 Device by Does not apply route every 14 (fourteen) days. 1 each 3   Continuous Blood Gluc Sensor (FREESTYLE LIBRE 2 SENSOR) MISC 1 Device by Does not apply route every 14 (fourteen) days. 6 each 3   Cyanocobalamin (VITAMIN B-12 IJ) Inject 1,000 mcg as directed every 30 (thirty) days.     diltiazem (CARDIZEM CD) 240 MG 24 hr capsule Take 1 capsule (240 mg  total) by mouth daily. 90 capsule 2   divalproex (DEPAKOTE ER) 250 MG 24 hr tablet Take 1 tablet (250 mg total) by mouth at bedtime. 30 tablet 5   fluticasone (FLONASE) 50 MCG/ACT nasal spray SPRAY 2 SPRAYS INTO EACH NOSTRIL EVERY DAY (Patient taking differently: Place 2 sprays into both nostrils daily as needed for allergies.) 48 mL 3   Fluticasone-Umeclidin-Vilant (TRELEGY ELLIPTA) 200-62.5-25 MCG/ACT AEPB Inhale 1 puff into the lungs daily at 6 (six) AM. 1 each 0   furosemide (LASIX) 40 MG tablet Take 1 tablet (40 mg total) by mouth daily. (Patient taking differently: Take 40 mg by mouth every other day.) 30 tablet    glucose blood test strip Use as instructed to test blood sugars up to 4 times daily 200 each 12   insulin lispro protamine-lispro (HUMALOG 75/25 MIX) (75-25) 100 UNIT/ML SUSP injection Inject 18 Units into the skin 2 (two) times daily with a meal. 10 units at lunch depending on bs reading (Patient taking differently: Inject 10-18 Units into the  skin See admin instructions. Inject 16  units under the skin at breakfast and 16 units in the evening  PT does not takes if has not eaten) 40 mL 3   Insulin Syringe-Needle U-100 (INSULIN SYRINGE .3CC/29GX1") 29G X 1" 0.3 ML MISC 1 Device by Does not apply route in the morning and at bedtime. 200 each 3   isosorbide mononitrate (IMDUR) 60 MG 24 hr tablet Take one tablet ( 60 mg ) twice daily 8 hours apart. 180 tablet 3   meclizine (ANTIVERT) 25 MG tablet Take 25 mg by mouth 2 (two) times daily as needed for dizziness.     metoprolol succinate (TOPROL-XL) 100 MG 24 hr tablet TAKE 1 TABLET BY MOUTH EVERY DAY 90 tablet 3   Multiple Vitamin (MULTIVITAMIN WITH MINERALS) TABS Take 1 tablet by mouth daily.     nitroGLYCERIN (NITROSTAT) 0.4 MG SL tablet Place 1 tablet (0.4 mg total) under the tongue every 5 (five) minutes as needed for chest pain. 25 tablet 3   ondansetron (ZOFRAN) 4 MG tablet Take 1 tablet (4 mg total) by mouth every 8 (eight) hours as needed for nausea or vomiting. 20 tablet 0   pantoprazole (PROTONIX) 40 MG tablet Take 1 tablet (40 mg total) by mouth daily. Take 30 minutes prior to a meal 30 tablet 5   spironolactone (ALDACTONE) 25 MG tablet TAKE 1 TABLET BY MOUTH EVERY DAY (Patient taking differently: Take 25 mg by mouth every evening.) 90 tablet 3   Study - ORION 4 - inclisiran 300 mg/1.19mL or placebo SQ injection (PI-Stuckey) Inject 300 mg into the skin every 6 (six) months.     temazepam (RESTORIL) 30 MG capsule Take 1 capsule (30 mg total) by mouth daily. (Patient taking differently: Take 30 mg by mouth at bedtime.) 30 capsule 0   Current Facility-Administered Medications on File Prior to Visit  Medication Dose Route Frequency Provider Last Rate Last Admin   Study - ORION 4 - inclisiran 300 mg/1.45mL or placebo SQ injection (PI-Stuckey)  300 mg Subcutaneous Q6 months Herby Abraham, MD   300 mg at 06/22/22 1015    ALLERGIES: Allergies  Allergen Reactions   Amlodipine Other (See  Comments)    hallucinations    Banana Nausea And Vomiting    Stomach pumped   Co Q10 [Coenzyme Q10] Other (See Comments)    Body cramps   Codeine Other (See Comments)    Hallucinate, loose identity and don't  know who I am   Insulin Aspart Other (See Comments)    Unknown   Lisinopril Other (See Comments)    nose bleed   Metformin And Related Nausea And Vomiting   Morphine Other (See Comments)    Can not function, it immobilizes me    Pentazocine Nausea And Vomiting   Pravastatin Other (See Comments)    Hands locked up   Repatha [Evolocumab] Other (See Comments)    myalgias   Statins Other (See Comments)    Muscle cramps   Sulfa Antibiotics Swelling   Sulfonamide Derivatives Swelling   Tramadol Hcl Other (See Comments)    Dizziness, cant function     FAMILY HISTORY: Family History  Problem Relation Age of Onset   Heart disease Maternal Grandfather    Heart failure Maternal Grandfather    Diabetes Maternal Grandfather    Heart attack Father    Diabetes Mother    Rheum arthritis Sister    Emphysema Paternal Uncle    Esophageal cancer Brother 18       she said he was born with it   Emphysema Paternal Aunt    Healthy Child    Neuropathy Neg Hx    Multiple sclerosis Neg Hx    Colon cancer Neg Hx    Colon polyps Neg Hx    Rectal cancer Neg Hx    Stomach cancer Neg Hx       Objective:  ***. General: No acute distress.  Patient appears well-groomed.   Head:  Normocephalic/atraumatic Eyes:  Fundi examined but not visualized Neck: supple, no paraspinal tenderness, full range of motion Heart:  Regular rate and rhythm Neurological Exam: alert and oriented to person, place, and time.  Speech fluent and not dysarthric, language intact.  CN II-XII intact. Bulk and tone normal, muscle strength 5/5 throughout.  Sensation to light touch intact.  Deep tendon reflexes 1+ throughout.  Finger to nose testing intact.  Cautious gaitl, Romberg negative.   Shon Millet, DO  CC: Cheryll Cockayne, MD

## 2022-06-23 NOTE — Progress Notes (Signed)
Virtual Visit via Telephone Note   Because of [name]'s co-morbid illnesses, she is at least at moderate risk for complications without adequate follow up.  This format is felt to be most appropriate for this patient at this time.  The patient did not have access to video technology/had technical difficulties with video requiring transitioning to audio format only (telephone).  All issues noted in this document were discussed and addressed.  No physical exam could be performed with this format.  Please refer to the patient's chart for her consent to telehealth for [facility].  Evaluation Performed:  Preoperative cardiovascular risk assessment _____________   Date:  06/23/2022   Patient ID:  [name], DOB 1940-08-27, MRN [id number] Patient Location:  Home Provider location:   Office  Primary Care Provider:  Pincus Sanes, MD Primary Cardiologist:  Kristeen Miss, MD  Chief Complaint / Patient Profile   82 y.o. y/o female with a h/o CAD s/p CABG with repeat cardiac catheterization 07/2021 with no significant change, medical therapy recommended, COPD, chronic HFpEF, mild pulmonary hypertension, hyperlipidemia, HTN who is pending left reverse total shoulder and presents today for telephonic preoperative cardiovascular risk assessment.  History of Present Illness    [name] is a 82 y.o. female who presents via audio/video conferencing for a telehealth visit today.  Pt was last seen in cardiology clinic on 10/30/21 by Dr. Elease Hashimoto. At that time [name] was doing well.  The patient is now pending procedure as outlined above. Since her last visit, she reports chronic chest discomfort and shortness of breath that feel the same as prior to cardiac catheterization June 2023. She also has frequent lightheadedness but no presyncope, syncope. She denies lower extremity edema, fatigue, palpitations,  hematuria, hemoptysis, diaphoresis, weakness, orthopnea, and PND. She is  active in her home doing house work and grocery shopping. No new cardiac symptoms with exertion.    Past Medical History    Past Medical History:  Diagnosis Date   Adenomatous colon polyp    Allergy    Anxiety    Arthritis    Asthma    Carotid artery disease (HCC)    carotid US 02/2017: bilat ICA 1-39%   Chronic diastolic CHF    Echo 02/2017: EF 65-70, Gr 2 DD, mild MS (mean 5), PASP 44   COPD    pt is unsure if has been officially diagnosed   Coronary artery disease    CABG '09- cathed 12/09, 9/10, 6/11, 3/14 and 12/13/16- medical Rx // cath 12/2016 - 2/3 grafts patent >> med Rx // Myoview 12/17: low risk   Diabetes mellitus    Dyspnea    with exertion   Gastroesophageal reflux disease    Headache    Hiatal hernia    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   Hyperlipidemia    Hypertension    Myocardial infarction (HCC)    PONV (postoperative nausea and vomiting)    Schatzki's ring    Shoulder injury    resolved after shoulder surgery   Sleep apnea    not on cpap   Past Surgical History:  Procedure Laterality Date   ABDOMINAL HYSTERECTOMY     APPENDECTOMY     came out with Hysterectomy   CARDIAC CATHETERIZATION  07/23/2009   EF 60%   CARDIAC CATHETERIZATION  10/11/2008   CARDIAC CATHETERIZATION  03/01/2007   EF 75-80%   CARDIOVASCULAR STRESS TEST  11/15/2007  EF 60%   COLONOSCOPY     CORONARY ARTERY BYPASS GRAFT     SEVERELY DISEASED SAPHENOUS VEIN GRAFT TO THE RIGHT CORONARY ARTERY BUT WITH FAIRLY WELL PRESERVED FLOW TO THE DISTAL RIGHT CORONARY ARTERY FROM THE NATIVE CIRCULATION-RESTART  CATH IN JUNE 2000, REVEALS MILD/MODERATE  CAD WITH GOOD FLOW DOWN HER LAD   ESOPHAGOGASTRODUODENOSCOPY     EYE SURGERY     bilateral cataract surgery with lens implant   LEFT HEART CATH AND CORS/GRAFTS ANGIOGRAPHY N/A 12/14/2016   Procedure: LEFT HEART CATH AND CORS/GRAFTS ANGIOGRAPHY;  Surgeon: Corky Crafts, MD;  Location: [facility];  Service: Cardiovascular;   Laterality: N/A;   lense removal Left    POLYPECTOMY     RIGHT/LEFT HEART CATH AND CORONARY/GRAFT ANGIOGRAPHY N/A 07/28/2021   Procedure: RIGHT/LEFT HEART CATH AND CORONARY/GRAFT ANGIOGRAPHY;  Surgeon: Lennette Bihari, MD;  Location: [facility];  Service: Cardiovascular;  Laterality: N/A;   ROTATOR CUFF REPAIR     right and left   TONSILLECTOMY     age 5   TOTAL KNEE ARTHROPLASTY Right 02/20/2014   Procedure: RIGHT TOTAL KNEE ARTHROPLASTY;  Surgeon: Jacki Cones, MD;  Location: [facility];  Service: Orthopedics;  Laterality: Right;   tumor removed kidney     UPPER GASTROINTESTINAL ENDOSCOPY     US ECHOCARDIOGRAPHY  03/08/2008   EF 55-60%    Allergies  Allergies  Allergen Reactions   Amlodipine Other (See Comments)    hallucinations    Banana Nausea And Vomiting    Stomach pumped   Co Q10 [Coenzyme Q10] Other (See Comments)    Body cramps   Codeine Other (See Comments)    Hallucinate, loose identity and don't know who I am   Insulin Aspart Other (See Comments)    Unknown   Lisinopril Other (See Comments)    nose bleed   Metformin And Related Nausea And Vomiting   Morphine Other (See Comments)    Can not function, it immobilizes me    Pentazocine Nausea And Vomiting   Pravastatin Other (See Comments)    Hands locked up   Repatha [Evolocumab] Other (See Comments)    myalgias   Statins Other (See Comments)    Muscle cramps   Sulfa Antibiotics Swelling   Sulfonamide Derivatives Swelling   Tramadol Hcl Other (See Comments)    Dizziness, cant function     Home Medications    Prior to Admission medications   Medication Sig Start Date End Date Taking? Authorizing Provider  acetaminophen (TYLENOL) 325 MG tablet Take 650 mg by mouth every 6 (six) hours as needed for moderate pain or headache.    [provider]  albuterol (PROVENTIL HFA;VENTOLIN HFA) 108 (90 BASE) MCG/ACT inhaler Inhale 2 puffs into the lungs every 6 (six) hours as needed. For shortness of  breath. 03/06/12   Storm Frisk, MD  albuterol (PROVENTIL) (2.5 MG/3ML) 0.083% nebulizer solution USE 1 VIAL IN NEBULIZER 4 TIMES DAILY Patient taking differently: Take 2.5 mg by nebulization every 6 (six) hours as needed for wheezing or shortness of breath. 05/07/20   Chilton Greathouse, MD  aspirin EC 81 MG tablet Take 1 tablet (81 mg total) by mouth daily. 12/10/16   Tereso Newcomer T, PA-C  baclofen (LIORESAL) 20 MG tablet TAKE 2 TABLETS BY MOUTH TWICE A DAY 05/10/22   Burns, Bobette Mo, MD  Bempedoic Acid-Ezetimibe (NEXLIZET) 180-10 MG TABS Take 1 tablet by mouth daily. 08/26/21   Beatrice Lecher,  PA-C  benzonatate (TESSALON) 200 MG capsule Take 1 capsule (200 mg total) by mouth 3 (three) times daily as needed for cough. 03/05/22   Mannam, Colbert Coyer, MD  calcium carbonate (TUMS - DOSED IN MG ELEMENTAL CALCIUM) 500 MG chewable tablet Chew 1 tablet by mouth daily as needed for indigestion or heartburn.    [provider]  Cholecalciferol (VITAMIN D) 50 MCG (2000 UT) tablet Take 4,000 Units by mouth daily.    [provider]  Continuous Blood Gluc Receiver (FREESTYLE LIBRE 2 READER) DEVI 1 Device by Does not apply route every 14 (fourteen) days. 05/03/22   Shamleffer, Konrad Dolores, MD  Continuous Blood Gluc Sensor (FREESTYLE LIBRE 2 SENSOR) MISC 1 Device by Does not apply route every 14 (fourteen) days. 05/03/22   Shamleffer, Konrad Dolores, MD  Cyanocobalamin (VITAMIN B-12 IJ) Inject 1,000 mcg as directed every 30 (thirty) days.    [provider]  diltiazem (CARDIZEM CD) 240 MG 24 hr capsule Take 1 capsule (240 mg total) by mouth daily. 07/17/21   Pincus Sanes, MD  divalproex (DEPAKOTE ER) 250 MG 24 hr tablet Take 1 tablet (250 mg total) by mouth at bedtime. 02/03/22   Jaffe, Adam R, DO  fluticasone (FLONASE) 50 MCG/ACT nasal spray SPRAY 2 SPRAYS INTO EACH NOSTRIL EVERY DAY Patient taking differently: Place 2 sprays into both nostrils daily as needed for allergies. 03/18/22    Chilton Greathouse, MD  Fluticasone-Umeclidin-Vilant (TRELEGY ELLIPTA) 200-62.5-25 MCG/ACT AEPB Inhale 1 puff into the lungs daily at 6 (six) AM. 05/01/21   Parrett, Virgel Bouquet, NP  furosemide (LASIX) 40 MG tablet Take 1 tablet (40 mg total) by mouth daily. Patient taking differently: Take 40 mg by mouth every other day. 10/05/21 06/23/22  Pincus Sanes, MD  glucose blood test strip Use as instructed to test blood sugars up to 4 times daily 11/04/21   Pincus Sanes, MD  insulin lispro protamine-lispro (HUMALOG 75/25 MIX) (75-25) 100 UNIT/ML SUSP injection Inject 18 Units into the skin 2 (two) times daily with a meal. 10 units at lunch depending on bs reading Patient taking differently: Inject 10-18 Units into the skin See admin instructions. Inject 16  units under the skin at breakfast and 16 units in the evening  PT does not takes if has not eaten 03/30/22   Shamleffer, Konrad Dolores, MD  Insulin Syringe-Needle U-100 (INSULIN SYRINGE .3CC/29GX1") 29G X 1" 0.3 ML MISC 1 Device by Does not apply route in the morning and at bedtime. 12/18/21   Shamleffer, Konrad Dolores, MD  isosorbide mononitrate (IMDUR) 60 MG 24 hr tablet Take one tablet ( 60 mg ) twice daily 8 hours apart. 05/14/20   Tereso Newcomer T, PA-C  meclizine (ANTIVERT) 25 MG tablet Take 25 mg by mouth 2 (two) times daily as needed for dizziness.    [provider]  metoprolol succinate (TOPROL-XL) 100 MG 24 hr tablet TAKE 1 TABLET BY MOUTH EVERY DAY 05/11/22   Nahser, Deloris Ping, MD  Multiple Vitamin (MULTIVITAMIN WITH MINERALS) TABS Take 1 tablet by mouth daily.    [provider]  nitroGLYCERIN (NITROSTAT) 0.4 MG SL tablet Place 1 tablet (0.4 mg total) under the tongue every 5 (five) minutes as needed for chest pain. 07/22/21   Lyn Records, MD  ondansetron (ZOFRAN) 4 MG tablet Take 1 tablet (4 mg total) by mouth every 8 (eight) hours as needed for nausea or vomiting. 06/02/22   Pincus Sanes, MD  pantoprazole (PROTONIX) 40 MG tablet  Take 1 tablet (40 mg total) by mouth daily. Take 30 minutes prior to a meal 06/02/22   Pincus Sanes, MD  spironolactone (ALDACTONE) 25 MG tablet TAKE 1 TABLET BY MOUTH EVERY DAY Patient taking differently: Take 25 mg by mouth every evening. 09/15/21   Nahser, Deloris Ping, MD  Study - ORION 4 - inclisiran 300 mg/1.18mL or placebo SQ injection (PI-Stuckey) Inject 300 mg into the skin every 6 (six) months. 01/17/18   [provider]  temazepam (RESTORIL) 30 MG capsule Take 1 capsule (30 mg total) by mouth daily. Patient taking differently: Take 30 mg by mouth at bedtime. 04/07/22   Pincus Sanes, MD    Physical Exam    Vital Signs:  [name] does not have vital signs available for review today.  Given telephonic nature of communication, physical exam is limited. AAOx3. NAD. Normal affect.  Speech and respirations are unlabored.  Accessory Clinical Findings    None  Assessment & Plan    1.  Preoperative Cardiovascular Risk Assessment: According to the Revised Cardiac Risk Index (RCRI), her Perioperative Risk of Major Cardiac Event is (%): 11. Her Functional Capacity in METs is: 4.4 according to the [place] Activity Status Index (DASI).  The patient was advised that if she develops new symptoms prior to surgery to contact our office to arrange for a follow-up visit, and she verbalized understanding.  Per office protocol, he may hold aspirin for 7 days prior to procedure and should resume as soon as hemodynamically stable postoperatively.   A copy of this note will be routed to requesting surgeon.  Time:   Today, I have spent 10 minutes with the patient with telehealth technology discussing medical history, symptoms, and management plan.    Levi Aland, NP-C  06/23/2022, 11:00 AM [address] Office [phone] Fax [phone]

## 2022-06-24 NOTE — Anesthesia Preprocedure Evaluation (Addendum)
Anesthesia Evaluation  Patient identified by MRN, date of birth, ID band Patient awake    Reviewed: Allergy & Precautions, NPO status , Patient's Chart, lab work & pertinent test results  History of Anesthesia Complications (+) PONV  Airway Mallampati: II  TM Distance: >3 FB Neck ROM: Full    Dental  (+) Edentulous Upper, Edentulous Lower, Upper Dentures, Lower Dentures, Dental Advisory Given   Pulmonary asthma , sleep apnea , COPD,  COPD inhaler, former smoker (10.5 pyear hx)   Pulmonary exam normal breath sounds clear to auscultation       Cardiovascular hypertension, Pt. on home beta blockers and Pt. on medications + CAD, + Past MI (2009), + CABG, + Peripheral Vascular Disease and +CHF  Normal cardiovascular exam+ Valvular Problems/Murmurs (Mild MS)  Rhythm:Regular Rate:Normal  11/2021 TTE 1. Left ventricular ejection fraction, by estimation, is >75%. The left  ventricle has hyperdynamic function. The left ventricle has no regional  wall motion abnormalities. Left ventricular diastolic function could not  be evaluated.   2. Right ventricular systolic function is normal. The right ventricular  size is normal. Tricuspid regurgitation signal is inadequate for assessing  PA pressure.   3. Left atrial size was mildly dilated.   4. The mitral valve is normal in structure. No evidence of mitral valve  regurgitation. Mild mitral stenosis. Severe mitral annular calcification.   5. The aortic valve is tricuspid. Aortic valve regurgitation is not  visualized. Aortic valve sclerosis/calcification is present, without any  evidence of aortic stenosis.   6. The inferior vena cava is normal in size with greater than 50%  respiratory variability, suggesting right atrial pressure of 3 mmHg.   Cath 2023 The left main coronary artery is a long normal caliber vessel which bifurcates into an LAD and large left circumflex vessel. The LAD has  smooth 60% narrowing proximally.  There is no antegrade filling of the first diagonal vessel which had previously been felt to be occluded at the ostium.  Competitive filling is seen in the mid LAD due to competitive LIMA flow. Left circumflex vessel is very large caliber and gives off a ramus intermediate like very proximal vessel.  The circumflex is tortuous but free of significant disease. Native RCA is large caliber and has previously noted 25% proximal and mid 6 stenoses.  There is no competitive filling seen by the SVG. 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  LVEDP 16 mmHg. Mildly elevated right heart pressures with mild pulmonary hypertension with mean PA pressure at 25 mm. Cardiac output by the thermodilution method 3.5 and by the Fick method 4.9 L/min. Cardiac index by thermodilution method 1.9 and by the Fick method 2.7 L/min/m. PVR: 3.0 WU    Neuro/Psych  Headaches PSYCHIATRIC DISORDERS Anxiety        GI/Hepatic hiatal hernia,GERD  ,,(+) Cirrhosis         Endo/Other  diabetes, Type 1, Insulin Dependent    Renal/GU Lab Results      Component                Value               Date                      CREATININE  0.81                06/09/2022                 K                        4.3                 06/09/2022                     Musculoskeletal  (+) Arthritis ,    Abdominal   Peds  Hematology Lab Results      Component                Value               Date                      HGB                      12.9                06/09/2022                      PLT                      269                 06/09/2022              Anesthesia Other Findings All: See list  Reproductive/Obstetrics                             Anesthesia  Physical Anesthesia Plan  ASA: 3  Anesthesia Plan: General and Regional   Post-op Pain Management: Regional block* and Tylenol PO (pre-op)*   Induction: Intravenous  PONV Risk Score and Plan: Treatment may vary due to age or medical condition and Ondansetron  Airway Management Planned: Oral ETT  Additional Equipment: None  Intra-op Plan:   Post-operative Plan: Extubation in OR  Informed Consent:      Dental advisory given  Plan Discussed with:   Anesthesia Plan Comments:         Anesthesia Quick Evaluation

## 2022-06-25 ENCOUNTER — Ambulatory Visit (HOSPITAL_COMMUNITY): Payer: Medicare Other

## 2022-06-25 ENCOUNTER — Other Ambulatory Visit: Payer: Self-pay

## 2022-06-25 ENCOUNTER — Encounter (HOSPITAL_COMMUNITY): Payer: Self-pay | Admitting: Orthopedic Surgery

## 2022-06-25 ENCOUNTER — Ambulatory Visit (HOSPITAL_BASED_OUTPATIENT_CLINIC_OR_DEPARTMENT_OTHER): Payer: Medicare Other | Admitting: Physician Assistant

## 2022-06-25 ENCOUNTER — Observation Stay (HOSPITAL_COMMUNITY)
Admission: RE | Admit: 2022-06-25 | Discharge: 2022-06-26 | Disposition: A | Discharge status: Home / Self Care | Payer: Medicare Other | Source: Ambulatory Visit | Attending: Orthopedic Surgery | Admitting: Orthopedic Surgery

## 2022-06-25 ENCOUNTER — Ambulatory Visit (HOSPITAL_COMMUNITY): Payer: Medicare Other | Admitting: Physician Assistant

## 2022-06-25 ENCOUNTER — Encounter (HOSPITAL_COMMUNITY)
Admission: RE | Disposition: A | Discharge status: Home / Self Care | Payer: Self-pay | Source: Ambulatory Visit | Attending: Orthopedic Surgery

## 2022-06-25 DIAGNOSIS — I5032 Chronic diastolic (congestive) heart failure: Secondary | ICD-10-CM | POA: Insufficient documentation

## 2022-06-25 DIAGNOSIS — Z96612 Presence of left artificial shoulder joint: Secondary | ICD-10-CM

## 2022-06-25 DIAGNOSIS — I2510 Atherosclerotic heart disease of native coronary artery without angina pectoris: Secondary | ICD-10-CM | POA: Insufficient documentation

## 2022-06-25 DIAGNOSIS — J45909 Unspecified asthma, uncomplicated: Secondary | ICD-10-CM | POA: Insufficient documentation

## 2022-06-25 DIAGNOSIS — I110 Hypertensive heart disease with heart failure: Secondary | ICD-10-CM

## 2022-06-25 DIAGNOSIS — Z96651 Presence of right artificial knee joint: Secondary | ICD-10-CM | POA: Diagnosis not present

## 2022-06-25 DIAGNOSIS — Z87891 Personal history of nicotine dependence: Secondary | ICD-10-CM

## 2022-06-25 DIAGNOSIS — Z7982 Long term (current) use of aspirin: Secondary | ICD-10-CM | POA: Insufficient documentation

## 2022-06-25 DIAGNOSIS — J449 Chronic obstructive pulmonary disease, unspecified: Secondary | ICD-10-CM

## 2022-06-25 DIAGNOSIS — Z794 Long term (current) use of insulin: Secondary | ICD-10-CM | POA: Insufficient documentation

## 2022-06-25 DIAGNOSIS — I509 Heart failure, unspecified: Secondary | ICD-10-CM

## 2022-06-25 DIAGNOSIS — M19012 Primary osteoarthritis, left shoulder: Secondary | ICD-10-CM

## 2022-06-25 DIAGNOSIS — E109 Type 1 diabetes mellitus without complications: Secondary | ICD-10-CM

## 2022-06-25 DIAGNOSIS — Z01818 Encounter for other preprocedural examination: Secondary | ICD-10-CM

## 2022-06-25 DIAGNOSIS — Z951 Presence of aortocoronary bypass graft: Secondary | ICD-10-CM | POA: Diagnosis not present

## 2022-06-25 DIAGNOSIS — Z79899 Other long term (current) drug therapy: Secondary | ICD-10-CM | POA: Diagnosis not present

## 2022-06-25 DIAGNOSIS — E119 Type 2 diabetes mellitus without complications: Secondary | ICD-10-CM | POA: Diagnosis not present

## 2022-06-25 HISTORY — PX: REVERSE SHOULDER ARTHROPLASTY: SHX5054

## 2022-06-25 LAB — GLUCOSE, CAPILLARY
Glucose-Capillary: 128 mg/dL — ABNORMAL HIGH (ref 70–99)
Glucose-Capillary: 146 mg/dL — ABNORMAL HIGH (ref 70–99)
Glucose-Capillary: 207 mg/dL — ABNORMAL HIGH (ref 70–99)
Glucose-Capillary: 208 mg/dL — ABNORMAL HIGH (ref 70–99)

## 2022-06-25 SURGERY — ARTHROPLASTY, SHOULDER, TOTAL, REVERSE
Anesthesia: Regional | Site: Shoulder | Laterality: Left

## 2022-06-25 MED ORDER — SODIUM CHLORIDE 0.9 % IV SOLN: INTRAVENOUS | Status: DC

## 2022-06-25 MED ORDER — FENTANYL CITRATE PF 50 MCG/ML IJ SOSY
PREFILLED_SYRINGE | INTRAMUSCULAR | Status: AC
  Filled 2022-06-25: qty 1

## 2022-06-25 MED ORDER — PHENYLEPHRINE 80 MCG/ML (10ML) SYRINGE FOR IV PUSH (FOR BLOOD PRESSURE SUPPORT)
PREFILLED_SYRINGE | INTRAVENOUS | Status: AC
  Filled 2022-06-25: qty 10

## 2022-06-25 MED ORDER — 0.9 % SODIUM CHLORIDE (POUR BTL) OPTIME
TOPICAL | Status: DC | PRN
  Administered 2022-06-25: 1000 mL

## 2022-06-25 MED ORDER — DOCUSATE SODIUM 100 MG PO CAPS: 100.0000 mg | ORAL_CAPSULE | Freq: Two times a day (BID) | ORAL | Status: DC

## 2022-06-25 MED ORDER — FENTANYL CITRATE PF 50 MCG/ML IJ SOSY
25.0000 ug | PREFILLED_SYRINGE | INTRAMUSCULAR | Status: DC | PRN
  Administered 2022-06-25: 25 ug via INTRAVENOUS
  Administered 2022-06-25 (×2): 50 ug via INTRAVENOUS

## 2022-06-25 MED ORDER — FREESTYLE LIBRE 2 SENSOR MISC: 1.0000 | Status: DC

## 2022-06-25 MED ORDER — PHENOL 1.4 % MT LIQD: 1.0000 | OROMUCOSAL | Status: DC | PRN

## 2022-06-25 MED ORDER — STERILE WATER FOR IRRIGATION IR SOLN
Status: DC | PRN
  Administered 2022-06-25: 2000 mL

## 2022-06-25 MED ORDER — POLYETHYLENE GLYCOL 3350 17 G PO PACK: 17.0000 g | PACK | Freq: Every day | ORAL | Status: DC | PRN

## 2022-06-25 MED ORDER — PHENYLEPHRINE HCL-NACL 20-0.9 MG/250ML-% IV SOLN
INTRAVENOUS | Status: AC
  Filled 2022-06-25: qty 250

## 2022-06-25 MED ORDER — SUGAMMADEX SODIUM 200 MG/2ML IV SOLN
INTRAVENOUS | Status: DC | PRN
  Administered 2022-06-25: 200 mg via INTRAVENOUS

## 2022-06-25 MED ORDER — ONDANSETRON HCL 4 MG PO TABS: 4.0000 mg | ORAL_TABLET | Freq: Three times a day (TID) | ORAL | 1 refills | Status: DC | PRN

## 2022-06-25 MED ORDER — METOCLOPRAMIDE HCL 5 MG/ML IJ SOLN: 5.0000 mg | Freq: Three times a day (TID) | INTRAMUSCULAR | Status: DC | PRN

## 2022-06-25 MED ORDER — NITROGLYCERIN 0.4 MG SL SUBL: 0.4000 mg | SUBLINGUAL_TABLET | SUBLINGUAL | Status: DC | PRN

## 2022-06-25 MED ORDER — CHLORHEXIDINE GLUCONATE 0.12 % MT SOLN: 15.0000 mL | Freq: Once | OROMUCOSAL | Status: DC

## 2022-06-25 MED ORDER — METOCLOPRAMIDE HCL 5 MG/ML IJ SOLN
5.0000 mg | Freq: Three times a day (TID) | INTRAMUSCULAR | Status: DC | PRN
  Administered 2022-06-25: 10 mg via INTRAVENOUS
  Filled 2022-06-25: qty 2

## 2022-06-25 MED ORDER — ONDANSETRON HCL 4 MG PO TABS
4.0000 mg | ORAL_TABLET | Freq: Four times a day (QID) | ORAL | Status: DC | PRN
  Administered 2022-06-25: 4 mg via ORAL
  Filled 2022-06-25: qty 1

## 2022-06-25 MED ORDER — PHENYLEPHRINE 80 MCG/ML (10ML) SYRINGE FOR IV PUSH (FOR BLOOD PRESSURE SUPPORT)
PREFILLED_SYRINGE | INTRAVENOUS | Status: DC | PRN
  Administered 2022-06-25: 160 ug via INTRAVENOUS
  Administered 2022-06-25: 240 ug via INTRAVENOUS
  Administered 2022-06-25: 80 ug via INTRAVENOUS
  Administered 2022-06-25: 240 ug via INTRAVENOUS
  Administered 2022-06-25: 80 ug via INTRAVENOUS

## 2022-06-25 MED ORDER — AMISULPRIDE (ANTIEMETIC) 5 MG/2ML IV SOLN
INTRAVENOUS | Status: AC
  Filled 2022-06-25: qty 4

## 2022-06-25 MED ORDER — ACETAMINOPHEN 325 MG PO TABS
325.0000 mg | ORAL_TABLET | Freq: Four times a day (QID) | ORAL | Status: DC | PRN
  Filled 2022-06-25 (×2): qty 2

## 2022-06-25 MED ORDER — ISOSORBIDE MONONITRATE ER 60 MG PO TB24
60.0000 mg | ORAL_TABLET | Freq: Two times a day (BID) | ORAL | Status: DC
  Administered 2022-06-25 – 2022-06-26 (×2): 60 mg via ORAL
  Filled 2022-06-25 (×2): qty 1

## 2022-06-25 MED ORDER — CEFAZOLIN SODIUM-DEXTROSE 2-4 GM/100ML-% IV SOLN: 2.0000 g | INTRAVENOUS | Status: DC

## 2022-06-25 MED ORDER — MECLIZINE HCL 25 MG PO TABS: 25.0000 mg | ORAL_TABLET | Freq: Two times a day (BID) | ORAL | Status: DC | PRN

## 2022-06-25 MED ORDER — ONDANSETRON HCL 4 MG/2ML IJ SOLN
INTRAMUSCULAR | Status: AC
  Filled 2022-06-25: qty 2

## 2022-06-25 MED ORDER — ONDANSETRON HCL 4 MG/2ML IJ SOLN: 4.0000 mg | Freq: Four times a day (QID) | INTRAMUSCULAR | Status: DC | PRN

## 2022-06-25 MED ORDER — LACTATED RINGERS IV SOLN: INTRAVENOUS | Status: DC

## 2022-06-25 MED ORDER — BEMPEDOIC ACID-EZETIMIBE 180-10 MG PO TABS: 1.0000 | ORAL_TABLET | Freq: Every day | ORAL | Status: DC

## 2022-06-25 MED ORDER — ONDANSETRON HCL 4 MG PO TABS: 4.0000 mg | ORAL_TABLET | Freq: Three times a day (TID) | ORAL | Status: DC | PRN

## 2022-06-25 MED ORDER — DEXAMETHASONE SODIUM PHOSPHATE 10 MG/ML IJ SOLN
INTRAMUSCULAR | Status: AC
  Filled 2022-06-25: qty 1

## 2022-06-25 MED ORDER — BUPIVACAINE-EPINEPHRINE (PF) 0.25% -1:200000 IJ SOLN
INTRAMUSCULAR | Status: DC | PRN
  Administered 2022-06-25: 15 mL via PERINEURAL

## 2022-06-25 MED ORDER — ACETAMINOPHEN 500 MG PO TABS
1000.0000 mg | ORAL_TABLET | Freq: Once | ORAL | Status: DC
  Filled 2022-06-25: qty 2

## 2022-06-25 MED ORDER — ADULT MULTIVITAMIN W/MINERALS CH
1.0000 | ORAL_TABLET | Freq: Every day | ORAL | Status: DC
  Administered 2022-06-26: 1 via ORAL
  Filled 2022-06-25: qty 1

## 2022-06-25 MED ORDER — LIDOCAINE HCL (PF) 2 % IJ SOLN
INTRAMUSCULAR | Status: AC
  Filled 2022-06-25: qty 5

## 2022-06-25 MED ORDER — AMISULPRIDE (ANTIEMETIC) 5 MG/2ML IV SOLN
10.0000 mg | Freq: Once | INTRAVENOUS | Status: AC
  Administered 2022-06-25: 10 mg via INTRAVENOUS

## 2022-06-25 MED ORDER — TRANEXAMIC ACID-NACL 1000-0.7 MG/100ML-% IV SOLN: 1000.0000 mg | INTRAVENOUS | Status: DC

## 2022-06-25 MED ORDER — ROCURONIUM BROMIDE 10 MG/ML (PF) SYRINGE
PREFILLED_SYRINGE | INTRAVENOUS | Status: DC | PRN
  Administered 2022-06-25: 50 mg via INTRAVENOUS

## 2022-06-25 MED ORDER — TRANEXAMIC ACID-NACL 1000-0.7 MG/100ML-% IV SOLN
1000.0000 mg | INTRAVENOUS | Status: AC
  Administered 2022-06-25: 1000 mg via INTRAVENOUS
  Filled 2022-06-25: qty 100

## 2022-06-25 MED ORDER — FENTANYL CITRATE (PF) 100 MCG/2ML IJ SOLN
INTRAMUSCULAR | Status: DC | PRN
  Administered 2022-06-25: 50 ug via INTRAVENOUS

## 2022-06-25 MED ORDER — INSULIN ASPART 100 UNIT/ML IJ SOLN
0.0000 [IU] | Freq: Three times a day (TID) | INTRAMUSCULAR | Status: DC
  Administered 2022-06-26: 2 [IU] via SUBCUTANEOUS
  Administered 2022-06-26: 3 [IU] via SUBCUTANEOUS

## 2022-06-25 MED ORDER — DIVALPROEX SODIUM ER 250 MG PO TB24
250.0000 mg | ORAL_TABLET | Freq: Every day | ORAL | Status: DC
  Administered 2022-06-26: 250 mg via ORAL
  Filled 2022-06-25: qty 1

## 2022-06-25 MED ORDER — TRANEXAMIC ACID-NACL 1000-0.7 MG/100ML-% IV SOLN
1000.0000 mg | Freq: Once | INTRAVENOUS | Status: AC
  Administered 2022-06-25: 1000 mg via INTRAVENOUS
  Filled 2022-06-25: qty 100

## 2022-06-25 MED ORDER — METOCLOPRAMIDE HCL 5 MG PO TABS: 5.0000 mg | ORAL_TABLET | Freq: Three times a day (TID) | ORAL | Status: DC | PRN

## 2022-06-25 MED ORDER — BUPIVACAINE HCL (PF) 0.25 % IJ SOLN
INTRAMUSCULAR | Status: AC
  Filled 2022-06-25: qty 30

## 2022-06-25 MED ORDER — ACETAMINOPHEN 325 MG PO TABS
650.0000 mg | ORAL_TABLET | Freq: Four times a day (QID) | ORAL | Status: DC | PRN
  Administered 2022-06-25 – 2022-06-26 (×3): 650 mg via ORAL
  Filled 2022-06-25: qty 2

## 2022-06-25 MED ORDER — INSULIN ASPART 100 UNIT/ML IJ SOLN
4.0000 [IU] | Freq: Three times a day (TID) | INTRAMUSCULAR | Status: DC
  Administered 2022-06-26 (×2): 4 [IU] via SUBCUTANEOUS

## 2022-06-25 MED ORDER — SPIRONOLACTONE 25 MG PO TABS
25.0000 mg | ORAL_TABLET | Freq: Every day | ORAL | Status: DC
  Administered 2022-06-26: 25 mg via ORAL
  Filled 2022-06-25: qty 1

## 2022-06-25 MED ORDER — CEFAZOLIN SODIUM-DEXTROSE 2-4 GM/100ML-% IV SOLN
2.0000 g | INTRAVENOUS | Status: AC
  Administered 2022-06-25: 2 g via INTRAVENOUS
  Filled 2022-06-25: qty 100

## 2022-06-25 MED ORDER — STUDY - ORION 4 - INCLISIRAN 300 MG/1.5 ML OR PLACEBO SQ INJECTION (PI-STUCKEY): 300.0000 mg | INJECTION | SUBCUTANEOUS | Status: DC

## 2022-06-25 MED ORDER — ALBUTEROL SULFATE (2.5 MG/3ML) 0.083% IN NEBU: 2.5000 mg | INHALATION_SOLUTION | Freq: Four times a day (QID) | RESPIRATORY_TRACT | Status: DC | PRN

## 2022-06-25 MED ORDER — ONDANSETRON HCL 4 MG/2ML IJ SOLN
4.0000 mg | Freq: Four times a day (QID) | INTRAMUSCULAR | Status: DC | PRN
  Administered 2022-06-26: 4 mg via INTRAVENOUS
  Filled 2022-06-25 (×2): qty 2

## 2022-06-25 MED ORDER — UMECLIDINIUM BROMIDE 62.5 MCG/ACT IN AEPB
1.0000 | INHALATION_SPRAY | Freq: Every day | RESPIRATORY_TRACT | Status: DC
  Administered 2022-06-26: 1 via RESPIRATORY_TRACT
  Filled 2022-06-25: qty 7

## 2022-06-25 MED ORDER — BENZONATATE 100 MG PO CAPS: 200.0000 mg | ORAL_CAPSULE | Freq: Three times a day (TID) | ORAL | Status: DC | PRN

## 2022-06-25 MED ORDER — LIDOCAINE 2% (20 MG/ML) 5 ML SYRINGE
INTRAMUSCULAR | Status: DC | PRN
  Administered 2022-06-25: 60 mg via INTRAVENOUS

## 2022-06-25 MED ORDER — MENTHOL 3 MG MT LOZG: 1.0000 | LOZENGE | OROMUCOSAL | Status: DC | PRN

## 2022-06-25 MED ORDER — DOCUSATE SODIUM 100 MG PO CAPS
100.0000 mg | ORAL_CAPSULE | Freq: Two times a day (BID) | ORAL | Status: DC
  Administered 2022-06-26: 100 mg via ORAL
  Filled 2022-06-25: qty 1

## 2022-06-25 MED ORDER — CEFAZOLIN SODIUM-DEXTROSE 2-4 GM/100ML-% IV SOLN
2.0000 g | Freq: Four times a day (QID) | INTRAVENOUS | Status: AC
  Administered 2022-06-25 – 2022-06-26 (×3): 2 g via INTRAVENOUS
  Filled 2022-06-25 (×3): qty 100

## 2022-06-25 MED ORDER — FENTANYL CITRATE (PF) 100 MCG/2ML IJ SOLN
INTRAMUSCULAR | Status: AC
  Filled 2022-06-25: qty 2

## 2022-06-25 MED ORDER — EPINEPHRINE PF 1 MG/ML IJ SOLN
INTRAMUSCULAR | Status: AC
  Filled 2022-06-25: qty 1

## 2022-06-25 MED ORDER — ACETAMINOPHEN 325 MG PO TABS: 325.0000 mg | ORAL_TABLET | Freq: Four times a day (QID) | ORAL | Status: DC | PRN

## 2022-06-25 MED ORDER — BISACODYL 10 MG RE SUPP: 10.0000 mg | Freq: Every day | RECTAL | Status: DC | PRN

## 2022-06-25 MED ORDER — BACLOFEN 20 MG PO TABS
40.0000 mg | ORAL_TABLET | Freq: Two times a day (BID) | ORAL | Status: DC
  Administered 2022-06-25 – 2022-06-26 (×2): 40 mg via ORAL
  Filled 2022-06-25 (×2): qty 2

## 2022-06-25 MED ORDER — FLUTICASONE FUROATE-VILANTEROL 200-25 MCG/ACT IN AEPB
1.0000 | INHALATION_SPRAY | Freq: Every day | RESPIRATORY_TRACT | Status: DC
  Administered 2022-06-26: 1 via RESPIRATORY_TRACT
  Filled 2022-06-25: qty 28

## 2022-06-25 MED ORDER — HYDROCODONE-ACETAMINOPHEN 5-325 MG PO TABS
1.0000 | ORAL_TABLET | ORAL | Status: DC | PRN
  Administered 2022-06-26: 1 via ORAL

## 2022-06-25 MED ORDER — TEMAZEPAM 15 MG PO CAPS
30.0000 mg | ORAL_CAPSULE | Freq: Every day | ORAL | Status: DC
  Administered 2022-06-26: 30 mg via ORAL
  Filled 2022-06-25: qty 2

## 2022-06-25 MED ORDER — INSULIN LISPRO PROT & LISPRO (75-25 MIX) 100 UNIT/ML ~~LOC~~ SUSP
18.0000 [IU] | Freq: Two times a day (BID) | SUBCUTANEOUS | Status: DC
  Administered 2022-06-25 – 2022-06-26 (×2): 10 [IU] via SUBCUTANEOUS
  Filled 2022-06-25: qty 10

## 2022-06-25 MED ORDER — HYDROCODONE-ACETAMINOPHEN 5-325 MG PO TABS
1.0000 | ORAL_TABLET | Freq: Four times a day (QID) | ORAL | Status: DC | PRN
  Filled 2022-06-25: qty 1

## 2022-06-25 MED ORDER — ASPIRIN 81 MG PO TBEC
81.0000 mg | DELAYED_RELEASE_TABLET | Freq: Every day | ORAL | Status: DC
  Administered 2022-06-26: 81 mg via ORAL
  Filled 2022-06-25: qty 1

## 2022-06-25 MED ORDER — ALBUTEROL SULFATE (2.5 MG/3ML) 0.083% IN NEBU: 3.0000 mL | INHALATION_SOLUTION | Freq: Four times a day (QID) | RESPIRATORY_TRACT | Status: DC | PRN

## 2022-06-25 MED ORDER — BUPIVACAINE LIPOSOME 1.3 % IJ SUSP
INTRAMUSCULAR | Status: DC | PRN
  Administered 2022-06-25: 10 mL via PERINEURAL

## 2022-06-25 MED ORDER — PROPOFOL 500 MG/50ML IV EMUL
INTRAVENOUS | Status: AC
  Filled 2022-06-25: qty 50

## 2022-06-25 MED ORDER — FREESTYLE LIBRE 2 READER DEVI: 1.0000 | Status: DC

## 2022-06-25 MED ORDER — FENTANYL CITRATE PF 50 MCG/ML IJ SOSY
50.0000 ug | PREFILLED_SYRINGE | INTRAMUSCULAR | Status: AC
  Administered 2022-06-25: 100 ug via INTRAVENOUS
  Filled 2022-06-25: qty 2

## 2022-06-25 MED ORDER — ONDANSETRON HCL 4 MG/2ML IJ SOLN
INTRAMUSCULAR | Status: DC | PRN
  Administered 2022-06-25: 4 mg via INTRAVENOUS

## 2022-06-25 MED ORDER — PROPOFOL 10 MG/ML IV BOLUS
INTRAVENOUS | Status: AC
  Filled 2022-06-25: qty 20

## 2022-06-25 MED ORDER — EPHEDRINE SULFATE-NACL 50-0.9 MG/10ML-% IV SOSY
PREFILLED_SYRINGE | INTRAVENOUS | Status: DC | PRN
  Administered 2022-06-25: 10 mg via INTRAVENOUS

## 2022-06-25 MED ORDER — DILTIAZEM HCL ER COATED BEADS 240 MG PO CP24
240.0000 mg | ORAL_CAPSULE | Freq: Every day | ORAL | Status: DC
  Administered 2022-06-25 – 2022-06-26 (×2): 240 mg via ORAL
  Filled 2022-06-25 (×2): qty 1

## 2022-06-25 MED ORDER — ROCURONIUM BROMIDE 10 MG/ML (PF) SYRINGE
PREFILLED_SYRINGE | INTRAVENOUS | Status: AC
  Filled 2022-06-25: qty 10

## 2022-06-25 MED ORDER — DEXAMETHASONE SODIUM PHOSPHATE 10 MG/ML IJ SOLN: INTRAMUSCULAR | Status: DC | PRN

## 2022-06-25 MED ORDER — VITAMIN D 25 MCG (1000 UNIT) PO TABS
4000.0000 [IU] | ORAL_TABLET | Freq: Every day | ORAL | Status: DC
  Administered 2022-06-26: 4000 [IU] via ORAL
  Filled 2022-06-25: qty 4

## 2022-06-25 MED ORDER — PANTOPRAZOLE SODIUM 40 MG PO TBEC
40.0000 mg | DELAYED_RELEASE_TABLET | Freq: Every day | ORAL | Status: DC
  Administered 2022-06-25 – 2022-06-26 (×2): 40 mg via ORAL
  Filled 2022-06-25 (×2): qty 1

## 2022-06-25 MED ORDER — HYDROCODONE-ACETAMINOPHEN 5-325 MG PO TABS: 1.0000 | ORAL_TABLET | Freq: Four times a day (QID) | ORAL | 0 refills | Status: DC | PRN

## 2022-06-25 MED ORDER — PROPOFOL 10 MG/ML IV BOLUS
INTRAVENOUS | Status: DC | PRN
  Administered 2022-06-25: 100 mg via INTRAVENOUS

## 2022-06-25 MED ORDER — BUPIVACAINE HCL (PF) 0.5 % IJ SOLN
INTRAMUSCULAR | Status: DC | PRN
  Administered 2022-06-25: 15 mL via PERINEURAL

## 2022-06-25 MED ORDER — FLUTICASONE PROPIONATE 50 MCG/ACT NA SUSP: 2.0000 | Freq: Every day | NASAL | Status: DC | PRN

## 2022-06-25 MED ORDER — FUROSEMIDE 40 MG PO TABS: 40.0000 mg | ORAL_TABLET | ORAL | Status: DC

## 2022-06-25 MED ORDER — CALCIUM CARBONATE ANTACID 500 MG PO CHEW: 1.0000 | CHEWABLE_TABLET | Freq: Every day | ORAL | Status: DC | PRN

## 2022-06-25 MED ORDER — METOPROLOL SUCCINATE ER 50 MG PO TB24
100.0000 mg | ORAL_TABLET | Freq: Every day | ORAL | Status: DC
  Administered 2022-06-25 – 2022-06-26 (×2): 100 mg via ORAL
  Filled 2022-06-25 (×2): qty 2

## 2022-06-25 MED ORDER — ORAL CARE MOUTH RINSE: 15.0000 mL | Freq: Once | OROMUCOSAL | Status: DC

## 2022-06-25 MED ORDER — ONDANSETRON HCL 4 MG PO TABS: 4.0000 mg | ORAL_TABLET | Freq: Four times a day (QID) | ORAL | Status: DC | PRN

## 2022-06-25 MED ORDER — PHENYLEPHRINE HCL-NACL 20-0.9 MG/250ML-% IV SOLN
INTRAVENOUS | Status: DC | PRN
  Administered 2022-06-25: 50 ug/min via INTRAVENOUS

## 2022-06-25 SURGICAL SUPPLY — 69 items
AID PSTN UNV HD RSTRNT DISP (MISCELLANEOUS) ×1
BAG COUNTER SPONGE SURGICOUNT (BAG) IMPLANT
BAG SPEC THK2 15X12 ZIP CLS (MISCELLANEOUS)
BAG SPNG CNTER NS LX DISP (BAG) ×1
BAG ZIPLOCK 12X15 (MISCELLANEOUS) IMPLANT
BIT DRILL 1.6MX128 (BIT) IMPLANT
BIT DRILL 170X2.5X (BIT) IMPLANT
BIT DRL 170X2.5X (BIT) ×1
BLADE SAG 18X100X1.27 (BLADE) ×2 IMPLANT
COVER BACK TABLE 60X90IN (DRAPES) ×2 IMPLANT
COVER SURGICAL LIGHT HANDLE (MISCELLANEOUS) ×2 IMPLANT
DRAPE INCISE IOBAN 66X45 STRL (DRAPES) ×2 IMPLANT
DRAPE ORTHO SPLIT 77X108 STRL (DRAPES) ×2
DRAPE SHEET LG 3/4 BI-LAMINATE (DRAPES) ×2 IMPLANT
DRAPE SURG ORHT 6 SPLT 77X108 (DRAPES) ×4 IMPLANT
DRAPE TOP 10253 STERILE (DRAPES) ×2 IMPLANT
DRAPE U-SHAPE 47X51 STRL (DRAPES) ×2 IMPLANT
DRILL 2.5 (BIT) ×1
DRSG ADAPTIC 3X8 NADH LF (GAUZE/BANDAGES/DRESSINGS) ×2 IMPLANT
DURAPREP 26ML APPLICATOR (WOUND CARE) ×2 IMPLANT
ELECT BLADE TIP CTD 4 INCH (ELECTRODE) ×2 IMPLANT
ELECT NDL TIP 2.8 STRL (NEEDLE) ×2 IMPLANT
ELECT NEEDLE TIP 2.8 STRL (NEEDLE) ×1 IMPLANT
ELECT REM PT RETURN 15FT ADLT (MISCELLANEOUS) ×2 IMPLANT
EPI LT SZ 1 (Orthopedic Implant) ×1 IMPLANT
EPIPHYSIS LT SZ 1 (Orthopedic Implant) IMPLANT
FACESHIELD WRAPAROUND (MASK) ×1 IMPLANT
FACESHIELD WRAPAROUND OR TEAM (MASK) ×2 IMPLANT
GAUZE PAD ABD 7.5X8 STRL (GAUZE/BANDAGES/DRESSINGS) IMPLANT
GAUZE PAD ABD 8X10 STRL (GAUZE/BANDAGES/DRESSINGS) ×2 IMPLANT
GAUZE SPONGE 4X4 12PLY STRL (GAUZE/BANDAGES/DRESSINGS) ×2 IMPLANT
GLENOSPHERE DELTA XTEND LAT 38 (Miscellaneous) IMPLANT
GLOVE BIOGEL PI IND STRL 7.5 (GLOVE) ×2 IMPLANT
GLOVE BIOGEL PI IND STRL 8.5 (GLOVE) ×2 IMPLANT
GLOVE ORTHO TXT STRL SZ7.5 (GLOVE) ×2 IMPLANT
GLOVE SURG ORTHO 8.5 STRL (GLOVE) ×2 IMPLANT
GOWN STRL REUS W/ TWL XL LVL3 (GOWN DISPOSABLE) ×4 IMPLANT
GOWN STRL REUS W/TWL XL LVL3 (GOWN DISPOSABLE) ×2
KIT BASIN OR (CUSTOM PROCEDURE TRAY) ×2 IMPLANT
KIT TURNOVER KIT A (KITS) IMPLANT
MANIFOLD NEPTUNE II (INSTRUMENTS) ×2 IMPLANT
METAGLENE DELTA EXTEND (Trauma) IMPLANT
METAGLENE DXTEND (Trauma) ×1 IMPLANT
NDL MAYO CATGUT SZ4 TPR NDL (NEEDLE) IMPLANT
NEEDLE MAYO CATGUT SZ4 (NEEDLE) IMPLANT
NS IRRIG 1000ML POUR BTL (IV SOLUTION) ×2 IMPLANT
PACK SHOULDER (CUSTOM PROCEDURE TRAY) ×2 IMPLANT
PIN GUIDE 1.2 (PIN) IMPLANT
PIN GUIDE GLENOPHERE 1.5MX300M (PIN) IMPLANT
PIN METAGLENE 2.5 (PIN) IMPLANT
RESTRAINT HEAD UNIVERSAL NS (MISCELLANEOUS) ×2 IMPLANT
SCREW LOCK 42 (Screw) IMPLANT
SCREW LOCK DELTA XTEND 4.5X30 (Screw) IMPLANT
SLING ARM FOAM STRAP LRG (SOFTGOODS) IMPLANT
SLING ARM FOAM STRAP MED (SOFTGOODS) IMPLANT
SPACER 38 PLUS 3 (Spacer) IMPLANT
SPIKE FLUID TRANSFER (MISCELLANEOUS) ×2 IMPLANT
SPONGE T-LAP 4X18 ~~LOC~~+RFID (SPONGE) IMPLANT
STEM DELTA DIA 10 HA (Stem) IMPLANT
STRIP CLOSURE SKIN 1/2X4 (GAUZE/BANDAGES/DRESSINGS) ×2 IMPLANT
SUT FIBERWIRE #2 38 T-5 BLUE (SUTURE) ×1
SUT MNCRL AB 4-0 PS2 18 (SUTURE) ×2 IMPLANT
SUT VIC AB 0 CT1 36 (SUTURE) ×2 IMPLANT
SUT VIC AB 0 CT2 27 (SUTURE) ×2 IMPLANT
SUT VIC AB 2-0 CT1 27 (SUTURE) ×1
SUT VIC AB 2-0 CT1 TAPERPNT 27 (SUTURE) ×2 IMPLANT
SUTURE FIBERWR #2 38 T-5 BLUE (SUTURE) ×2 IMPLANT
TAPE PAPER 1X10 WHT MICROPORE (GAUZE/BANDAGES/DRESSINGS) IMPLANT
TOWEL OR 17X26 10 PK STRL BLUE (TOWEL DISPOSABLE) ×2 IMPLANT

## 2022-06-25 NOTE — Op Note (Signed)
NAME: [name], [name] L. MEDICAL RECORD NO: [id number] ACCOUNT NO: [id number] DATE OF BIRTH: [birth year] FACILITY: [facility] LOCATION: [facility] PHYSICIAN: Almedia Balls. Ranell Patrick, MD  Operative Report   DATE OF PROCEDURE: 06/25/2022  PREOPERATIVE DIAGNOSIS:  Left shoulder end-stage arthritis.  POSTOPERATIVE DIAGNOSIS:  Left shoulder end-stage arthritis.  PROCEDURE PERFORMED:  Left reverse total shoulder arthroplasty using DePuy Delta Xtend prosthesis.  ATTENDING SURGEON:  Almedia Balls. Ranell Patrick, MD  ASSISTANT:  Konrad Felix Dixon, [place], who was scrubbed during the entire procedure, and necessary for satisfactory completion of surgery.  ANESTHESIA:  General anesthesia was used plus interscalene block.  ESTIMATED BLOOD LOSS:  Minimal.  FLUID REPLACEMENT:  1200 mL crystalloid.  COUNTS:  Instrument counts correct.  COMPLICATIONS:  No complications.  ANTIBIOTICS:  Perioperative antibiotics were given.  INDICATIONS:  The patient is an 82 year old female who presents with a history of worsening left shoulder pain secondary to end-stage arthritis and rotator cuff insufficiency.  The patient has had an extended period of conservative management has failed  and desires operative treatment to eliminate pain and restore function.  Informed consent obtained.  DESCRIPTION OF PROCEDURE:  After an adequate level of general anesthesia was achieved, the patient was positioned in the modified beach chair position.  Left shoulder correctly identified and sterile prep and drape performed.  Timeout called, verifying  correct patient, correct site, we entered the patient's shoulder using a standard deltopectoral approach, starting at the coracoid process extending down the anterior humerus.  Dissection down through subcutaneous tissues using Bovie.  Cephalic vein was  identified and taken laterally with the deltoid, pectoralis taken medially.  Conjoined tendon identified and retracted medially.  Deep retractors were placed.   Biceps tenodesed in situ with 0 Vicryl figure-of-eight suture, incorporating part of the  pectoralis tendon.  We then released the subscapularis off the lesser tuberosity and tagged for protection of the axillary nerve.  We then released the inferior capsule progressively externally rotating and extending the shoulder and delivering the  humeral head out of the wound.  Complete bone-on-bone arthritis was noted.  We entered the proximal humerus with a 6 mm reamer, reaming up to a size 10.  We then used our 10 mm T-handle guide and resected the head at 20 degrees of retroversion with the  oscillating saw, we removed excess osteophytes off the medial neck and head of the humerus.  We then subluxed the humerus posteriorly, gaining good exposure of the glenoid face.  We removed the capsule, labrum, biceps, stump and got good exposure of the  glenoid face.  We placed our guide pin centered on the glenoid and centered low and angled slightly inferiorly.  We then reamed for the metaglene baseplate.  Next, we did our peripheral hand reaming with the T-handle reamer and then we drilled at our  central peg hole.  We impacted the HA coated press-fit baseplate into position.  We then placed a 42 screw inferiorly and a 30 screw superiorly locking both.  We had good baseplate security and then selected the 38+0 standard glenosphere and attached  that to the baseplate with screwdriver.  Once we had the glenosphere attached I did a finger sweep to make sure we had no soft tissue caught up in that bearing.  We went back to the humeral side and prepped the metaphysis with the one left reamer.  Once  we had that done, we trialled with a 10 stem and 1 left metaphysis set on the 0 setting and impacted in about 20  degrees of retroversion.  We trialed with a 38+3 poly trial.  We had excellent soft tissue stability and balance.  No gapping with inferior  pole or external rotation and appropriate conjoined tensioning.  We removed all  trial components, irrigated thoroughly and then used available bone graft from the humeral head with impaction grafting technique, we impacted the HA coated press fit 10 stem  and 1 left metaphysis set on the 0 setting and impacted in 20 degrees of retroversion with the stem stable, we selected the real 38+3 poly placed on the humeral tray and reduced the shoulder, again good stability, excellent range of motion, no  impingement.  We irrigated thoroughly.  We resected the subscap remnant and then closed the deltopectoral interval with 0 Vicryl suture followed by 2-0 Vicryl for subcutaneous closure and 4-0 Monocryl for skin.  Steri-Strips applied followed by sterile  dressing.  The patient tolerated surgery well.   PUS D: 06/25/2022 10:57:06 am T: 06/25/2022 11:23:00 am  JOB: [id number]/ [id number]

## 2022-06-25 NOTE — Brief Op Note (Signed)
06/25/2022  10:48 AM  PATIENT:  [name]  82 y.o. female  PRE-OPERATIVE DIAGNOSIS:  LEFT SHOULDER OSTEOARTHRITIS, END STAGE   POST-OPERATIVE DIAGNOSIS:  LEFT SHOULDER OSTEOARTHRITIS, END STAGE  PROCEDURE:  Procedure(s) with comments: REVERSE SHOULDER ARTHROPLASTY (Left) - general and choice with interscalene block DePuy Delta Xtend with NO subscap repair  SURGEON:  Surgeon(s) and Role:    Beverely Low, MD - Primary  PHYSICIAN ASSISTANT:   ASSISTANTS: Thea Gist, PA-C   ANESTHESIA:   regional and general  EBL:  50 mL   BLOOD ADMINISTERED:none  DRAINS: none   LOCAL MEDICATIONS USED:  MARCAINE     SPECIMEN:  No Specimen  DISPOSITION OF SPECIMEN:  N/A  COUNTS:  YES  TOURNIQUET:  * No tourniquets in log *  DICTATION: .Other Dictation: Dictation Number [id number]  PLAN OF CARE: Discharge to home after PACU  PATIENT DISPOSITION:  PACU - hemodynamically stable.   Delay start of Pharmacological VTE agent (>24hrs) due to surgical blood loss or risk of bleeding: not applicable

## 2022-06-25 NOTE — Interval H&P Note (Signed)
History and Physical Interval Note:  06/25/2022 7:17 AM  [name]  has presented today for surgery, with the diagnosis of left shoulder osteoarthritis.  The various methods of treatment have been discussed with the patient and family. After consideration of risks, benefits and other options for treatment, the patient has consented to  Procedure(s) with comments: REVERSE SHOULDER ARTHROPLASTY (Left) - general and choice with interscalene block as a surgical intervention.  The patient's history has been reviewed, patient examined, no change in status, stable for surgery.  I have reviewed the patient's chart and labs.  Questions were answered to the patient's satisfaction.     [name]

## 2022-06-25 NOTE — Discharge Instructions (Signed)
Ice to the shoulder constantly.  Keep the incision covered and clean and dry for one week, then ok to get it wet in the shower. Leave the incision uncovered after one week and open to air  Do exercise as instructed several times per day.  DO NOT reach behind your back or push up out of a chair with the operative arm.  Use a sling while you are up and around for comfort, may remove while seated.  Keep pillow propped behind the operative elbow.  Follow up with Dr Ranell Patrick in two weeks in the office, call [phone] for appt

## 2022-06-25 NOTE — Anesthesia Procedure Notes (Signed)
Anesthesia Regional Block: Interscalene brachial plexus block   Pre-Anesthetic Checklist: , timeout performed,  Correct Patient, Correct Site, Correct Laterality,  Correct Procedure, Correct Position, site marked,  Risks and benefits discussed,  Pre-op evaluation,  At surgeon's request and post-op pain management  Laterality: Left  Prep: Maximum Sterile Barrier Precautions used, chloraprep       Needles:  Injection technique: Single-shot  Needle Type: Echogenic Stimulator Needle     Needle Length: 5cm  Needle Gauge: 21     Additional Needles:   Procedures:,,,, ultrasound used (permanent image in chart),,    Narrative:  Start time: 06/25/2022 8:23 AM End time: 06/25/2022 8:26 AM Injection made incrementally with aspirations every 5 mL. Anesthesiologist: [name], MD

## 2022-06-25 NOTE — Anesthesia Procedure Notes (Signed)
Procedure Name: Intubation Date/Time: 06/25/2022 9:08 AM  Performed by: Doran Clay, CRNAPre-anesthesia Checklist: Patient identified, Emergency Drugs available, Suction available, Patient being monitored and Timeout performed Patient Re-evaluated:Patient Re-evaluated prior to induction Oxygen Delivery Method: Circle system utilized Preoxygenation: Pre-oxygenation with 100% oxygen Induction Type: IV induction Ventilation: Mask ventilation without difficulty Laryngoscope Size: Mac and 3 Grade View: Grade I Tube type: Oral Tube size: 7.0 mm Number of attempts: 1 Airway Equipment and Method: Stylet Placement Confirmation: ETT inserted through vocal cords under direct vision, positive ETCO2 and breath sounds checked- equal and bilateral Secured at: 21 cm Tube secured with: Tape Dental Injury: Teeth and Oropharynx as per pre-operative assessment

## 2022-06-25 NOTE — Transfer of Care (Signed)
Immediate Anesthesia Transfer of Care Note  Patient: [name]  Procedure(s) Performed: REVERSE SHOULDER ARTHROPLASTY (Left: Shoulder)  Patient Location: PACU  Anesthesia Type:General  Level of Consciousness: sedated  Airway & Oxygen Therapy: Patient Spontanous Breathing and Patient connected to face mask oxygen  Post-op Assessment: Report given to RN and Post -op Vital signs reviewed and stable  Post vital signs: Reviewed and stable  Last Vitals:  Vitals Value Taken Time  BP 152/73 06/25/22 1053  Temp    Pulse 78 06/25/22 1053  Resp 14 06/25/22 1053  SpO2 94 % 06/25/22 1053  Vitals shown include unvalidated device data.  Last Pain:  Vitals:   06/25/22 0704  TempSrc:   PainSc: 8       Patients Stated Pain Goal: 4 (06/25/22 0704)  Complications: No notable events documented.

## 2022-06-26 DIAGNOSIS — M19012 Primary osteoarthritis, left shoulder: Secondary | ICD-10-CM | POA: Diagnosis not present

## 2022-06-26 LAB — GLUCOSE, CAPILLARY
Glucose-Capillary: 145 mg/dL — ABNORMAL HIGH (ref 70–99)
Glucose-Capillary: 165 mg/dL — ABNORMAL HIGH (ref 70–99)

## 2022-06-26 NOTE — Plan of Care (Signed)
Problem: Education: Goal: Knowledge of the prescribed therapeutic regimen will improve Outcome: Progressing Goal: Understanding of activity limitations/precautions following surgery will improve Outcome: Progressing   Problem: Activity: Goal: Ability to tolerate increased activity will improve Outcome: Progressing   

## 2022-06-26 NOTE — Plan of Care (Signed)
  Problem: Pain Management: Goal: Pain level will decrease with appropriate interventions Outcome: Progressing   Problem: Coping: Goal: Ability to adjust to condition or change in health will improve Outcome: Progressing   

## 2022-06-26 NOTE — Evaluation (Signed)
Occupational Therapy Evaluation Patient Details Name: [name] MRN: [id number] DOB: 1940/12/20 Today's Date: 06/26/2022   History of Present Illness 82 y.o. female admitted 06/25/22 for L reverse TSA. PMH: CAD, CABG, DM, HLD, HTN, COPD, OSA. Pt had an admission in November 2023 for fall, syncope, hypoglycemia.   Clinical Impression   Pt is a 82 year old female, s/p reverse shoulder replacement without functional use of LT non-dominant upper extremity secondary to effects of surgery and interscalene block and shoulder precautions. Therapist provided education and instruction to patient and daughter in regards to exercises, precautions, positioning, donning upper extremity clothing and bathing while maintaining shoulder precautions, ice and edema management and donning/doffing sling. Patient and dtr verbalized understanding and demonstrated as needed. Patient needed assistance to donn shirt, skirt, socks and has slip on shoes for ease and provided with instruction on compensatory strategies to perform ADLs. Patient limited by lethargy, post-op pain, decreased ROM in LT shoulder so therefore will need some form of assistance at home. Patient and spouse verbalized and/or demonstrated understanding to all instruction. Patient to follow up with MD for further therapy needs.        Recommendations for follow up therapy are one component of a multi-disciplinary discharge planning process, led by the attending physician.  Recommendations may be updated based on patient status, additional functional criteria and insurance authorization.   Assistance Recommended at Discharge Frequent or constant Supervision/Assistance  Patient can return home with the following A little help with walking and/or transfers;A lot of help with bathing/dressing/bathroom;Direct supervision/assist for financial management;Assist for transportation;Assistance with cooking/housework;Help with stairs or ramp for entrance;Direct  supervision/assist for medications management    Functional Status Assessment  Patient has had a recent decline in their functional status and demonstrates the ability to make significant improvements in function in a reasonable and predictable amount of time.  Equipment Recommendations  None recommended by OT    Recommendations for Other Services PT consult     Precautions / Restrictions Precautions Precautions: Shoulder Type of Shoulder Precautions: Reverse Tota Shoulder: Sling at all times except ADL/exercise Yes  Non weight bearing Yes  AROM elbow, wrist and hand to tolerance Yes  PROM of shoulder No  AROM of shoulder No Shoulder Interventions: Shoulder sling/immobilizer;At all times;Off for dressing/bathing/exercises Precaution Booklet Issued: Yes (comment) Required Braces or Orthoses: Sling Restrictions Weight Bearing Restrictions: Yes LUE Weight Bearing: Non weight bearing Other Position/Activity Restrictions: Ok for  gentle ADLs, hand to face ok active and lap slides      Mobility Bed Mobility Overal bed mobility: Needs Assistance Bed Mobility: Supine to Sit     Supine to sit: Min assist, HOB elevated     General bed mobility comments: Pt required help by pulling up on OT with RUE. Pt cautioned on NWB to LUE when mobizing in anxd out of bed.    Transfers                          Balance Overall balance assessment: Needs assistance   Sitting balance-Leahy Scale: Fair (lethargic)       Standing balance-Leahy Scale: Poor Standing balance comment: Furniture cruising with RUE. Unsteadym requiring Min guard for safety                           ADL either performed or assessed with clinical judgement   ADL Overall ADL's : Needs assistance/impaired Eating/Feeding: Minimal assistance;Sitting;With caregiver independent  assisting       Upper Body Bathing: With caregiver independent assisting;Set up;Sitting   Lower Body Bathing: Minimal  assistance;Sit to/from stand;Sitting/lateral leans;With caregiver independent assisting   Upper Body Dressing : Maximal assistance;Cueing for UE precautions;Cueing for sequencing;Cueing for compensatory techniques;Adhering to UE precautions;With caregiver independent assisting   Lower Body Dressing: Moderate assistance;Cueing for compensatory techniques;Cueing for safety;With caregiver independent assisting;Sit to/from stand;Sitting/lateral leans   Toilet Transfer: Min guard;Ambulation Toilet Transfer Details (indicate cue type and reason): Min guard assist to stand from EOB and ambulate around bed to recliner with LUE in sling and pt using RUE to furniture cruise and steady self. Pt reports that she uses this technique at home. Daughter added that she hass been encouraging pt to begin using a quad cane. Toileting- Clothing Manipulation and Hygiene: Moderate assistance;Sit to/from stand;Sitting/lateral lean;With caregiver independent assisting   Tub/ Shower Transfer: Minimal assistance;With caregiver independent assisting   Functional mobility during ADLs: Min guard;Minimal assistance       Vision Baseline Vision/History: 1 Wears glasses Vision Assessment?: No apparent visual deficits     Perception     Praxis      Pertinent Vitals/Pain Pain Assessment Pain Assessment: 0-10 Pain Score: 8  Pain Location: LUE Pain Descriptors / Indicators: Grimacing, Guarding Pain Intervention(s): Limited activity within patient's tolerance, Patient requesting pain meds-RN notified, Monitored during session, Ice applied     Hand Dominance Right   Extremity/Trunk Assessment Upper Extremity Assessment Upper Extremity Assessment: LUE deficits/detail (RUE: WFL) LUE Deficits / Details: Able to open close hand, move wrist and forearm. Unable to perform bicep curl yet. Educated on all exercises. LUE: Unable to fully assess due to immobilization   Lower Extremity Assessment Lower Extremity Assessment:  Generalized weakness   Cervical / Trunk Assessment Cervical / Trunk Assessment: Kyphotic   Communication Communication Communication: No difficulties   Cognition Arousal/Alertness: Lethargic Behavior During Therapy: WFL for tasks assessed/performed Overall Cognitive Status: Difficult to assess                                 General Comments: Ox4     General Comments       Exercises Other Exercises Other Exercises: Pt educated in all ordered post op exercises and pt able to demonstrate hand/wrist and forearm AROM and bicep curl with AAROM. Pt shown lap slides with handout to reinforce. Did not perform due to pain.   Shoulder Instructions      Home Living Family/patient expects to be discharged to:: Private residence Living Arrangements: Alone Available Help at Discharge: Family;Available 24 hours/day (2 daughters will rotate care) Type of Home: House Home Access: Level entry     Home Layout: One level     Bathroom Shower/Tub: Producer, television/film/video: Standard (sink on the RT to push from)     Home Equipment: Rolling Walker (2 wheels);Grab bars - tub/shower;Shower seat - built in;Cane - single point          Prior Functioning/Environment Prior Level of Function : History of Falls (last six months);Independent/Modified Independent                        OT Problem List: Impaired UE functional use;Pain;Decreased strength;Decreased range of motion;Decreased activity tolerance;Impaired balance (sitting and/or standing)      OT Treatment/Interventions:      OT Goals(Current goals can be found in the care plan section) Acute Rehab  OT Goals Patient Stated Goal: To rest.  But pt/daughter did agree to OT staying and completing training. OT Goal Formulation: All assessment and education complete, DC therapy ADL Goals Additional ADL Goal #1: Pt and/or daughter will demonstrate UE/LE dressing, donning/doffing of sling, correct positioning  of LT UE, and compensatory strategies for LT axilla hygiene, all while correctly following all shoulder post-op precautions/restrictions.  OT Frequency:      Co-evaluation              AM-PAC OT "6 Clicks" Daily Activity     Outcome Measure Help from another person eating meals?: A Little Help from another person taking care of personal grooming?: A Little Help from another person toileting, which includes using toliet, bedpan, or urinal?: A Little Help from another person bathing (including washing, rinsing, drying)?: A Lot Help from another person to put on and taking off regular upper body clothing?: A Lot Help from another person to put on and taking off regular lower body clothing?: A Lot 6 Click Score: 15   End of Session Equipment Utilized During Treatment: Oxygen;Other (comment) (sling) Nurse Communication: Patient requests pain meds;Other (comment) (Pills found in bed. RN alerted that pill put aside. OT completed with training)  Activity Tolerance: Patient limited by lethargy Patient left: in chair;with call bell/phone within reach;with family/visitor present  OT Visit Diagnosis: Unsteadiness on feet (R26.81);Muscle weakness (generalized) (M62.81);Pain Pain - Right/Left: Left Pain - part of body: Shoulder;Arm;Hand                Time: 1240-1320 OT Time Calculation (min): 40 min Charges:  OT General Charges $OT Visit: 1 Visit OT Evaluation $OT Eval Low Complexity: 1 Low OT Treatments $Self Care/Home Management : 8-22 mins $Therapeutic Activity: 8-22 mins  Victorino Dike, OT Acute Rehab Services Office: [phone] 06/26/2022  Theodoro Clock 06/26/2022, 1:41 PM

## 2022-06-26 NOTE — Evaluation (Signed)
Physical Therapy Evaluation Patient Details Name: [name] MRN: [id number] DOB: 15-Jul-1940 Today's Date: 06/26/2022  History of Present Illness  82 y.o. female admitted 06/25/22 for L reverse TSA. PMH: CAD, CABG, DM, HLD, HTN, COPD, OSA. Pt had an admission in November 2023 for fall, syncope, hypoglycemia.  Clinical Impression  Pt ambulated 75' with a quad cane without loss of balance, SpO2 88% on room air walking, 93% on room air after 1 minute seated rest on room air. She is ready to DC home from a PT standpoint. Pt's daughter present during session, she will be primary CG.           Recommendations for follow up therapy are one component of a multi-disciplinary discharge planning process, led by the attending physician.  Recommendations may be updated based on patient status, additional functional criteria and insurance authorization.  Follow Up Recommendations       Assistance Recommended at Discharge Intermittent Supervision/Assistance  Patient can return home with the following  A little help with walking and/or transfers;A little help with bathing/dressing/bathroom;Assistance with cooking/housework;Assist for transportation;Help with stairs or ramp for entrance    Equipment Recommendations None recommended by PT  Recommendations for Other Services       Functional Status Assessment Patient has had a recent decline in their functional status and demonstrates the ability to make significant improvements in function in a reasonable and predictable amount of time.     Precautions / Restrictions Precautions Precautions: Shoulder Type of Shoulder Precautions: Reverse Total Shoulder: Sling at all times except ADL/exercise Yes  Non weight bearing Yes  AROM elbow, wrist and hand to tolerance Yes  PROM of shoulder No  AROM of shoulder No Shoulder Interventions: Shoulder sling/immobilizer;At all times;Off for dressing/bathing/exercises Precaution Booklet Issued: Yes  (comment) Required Braces or Orthoses: Sling Restrictions Weight Bearing Restrictions: Yes LUE Weight Bearing: Non weight bearing Other Position/Activity Restrictions: Ok for  gentle ADLs, hand to face ok active and lap slides      Mobility  Bed Mobility               General bed mobility comments: up in recliner    Transfers Overall transfer level: Needs assistance Equipment used: None Transfers: Sit to/from Stand Sit to Stand: Min guard           General transfer comment: min/guard for safety, used armest to pushup with RUE    Ambulation/Gait Ambulation/Gait assistance: Supervision Gait Distance (Feet): 70 Feet Assistive device: Quad cane Gait Pattern/deviations: Step-through pattern, Decreased stride length Gait velocity: decr     General Gait Details: no loss of balance, SpO2 88% on room air walking, 93% after 1 minute seated rest on room air  Stairs            Wheelchair Mobility    Modified Rankin (Stroke Patients Only)       Balance     Sitting balance-Leahy Scale: Good     Standing balance support: Single extremity supported, During functional activity Standing balance-Leahy Scale: Fair                               Pertinent Vitals/Pain Pain Assessment Pain Score: 6  Pain Location: LUE Pain Descriptors / Indicators: Grimacing, Guarding Pain Intervention(s): Limited activity within patient's tolerance, Monitored during session, Premedicated before session, Patient requesting pain meds-RN notified    Home Living Family/patient expects to be discharged to:: Private residence Living Arrangements: Alone Available  Help at Discharge: Available 24 hours/day;Family Type of Home: House Home Access: Level entry       Home Layout: One level Home Equipment: Rolling Walker (2 wheels);Grab bars - tub/shower;Shower seat - built in;Cane - single point;Cane - quad Additional Comments: 2 daughters to stay with pt    Prior  Function Prior Level of Function : History of Falls (last six months);Independent/Modified Independent             Mobility Comments: denies falls in the past 6 months, ambulated without AD       Hand Dominance   Dominant Hand: Right    Extremity/Trunk Assessment   Upper Extremity Assessment Upper Extremity Assessment: Defer to OT evaluation LUE Deficits / Details: Able to open close hand, move wrist and forearm. Unable to perform bicep curl yet. Educated on all exercises. LUE: Unable to fully assess due to immobilization    Lower Extremity Assessment Lower Extremity Assessment: Overall WFL for tasks assessed    Cervical / Trunk Assessment Cervical / Trunk Assessment: Kyphotic  Communication   Communication: No difficulties  Cognition Arousal/Alertness: Awake/alert Behavior During Therapy: WFL for tasks assessed/performed Overall Cognitive Status: Difficult to assess                                          General Comments      Exercises     Assessment/Plan    PT Assessment Patient does not need any further PT services  PT Problem List         PT Treatment Interventions      PT Goals (Current goals can be found in the Care Plan section)  Acute Rehab PT Goals PT Goal Formulation: All assessment and education complete, DC therapy    Frequency       Co-evaluation               AM-PAC PT "6 Clicks" Mobility  Outcome Measure Help needed turning from your back to your side while in a flat bed without using bedrails?: A Little Help needed moving from lying on your back to sitting on the side of a flat bed without using bedrails?: A Little Help needed moving to and from a bed to a chair (including a wheelchair)?: A Little Help needed standing up from a chair using your arms (e.g., wheelchair or bedside chair)?: A Little Help needed to walk in hospital room?: None Help needed climbing 3-5 steps with a railing? : A Little 6 Click  Score: 19    End of Session Equipment Utilized During Treatment: Gait belt Activity Tolerance: Patient tolerated treatment well Patient left: in bed;with call bell/phone within reach;with family/visitor present Nurse Communication: Mobility status      Time: 0254-2706 PT Time Calculation (min) (ACUTE ONLY): 12 min   Charges:   PT Evaluation $PT Eval Moderate Complexity: 1 Mod         Tamala Ser PT 06/26/2022  Acute Rehabilitation Services  Office [phone]

## 2022-06-26 NOTE — Progress Notes (Signed)
    Subjective:  Patient reports pain as mild to moderate.  Denies V/CP/SOB/Abd pain. Patient reports she is having more pain today than yesterday. She is only taking tylenol. Discussed that she has other medication ordered as needed. She reports some nausea this morning, nurse aware and will give zofran.   Objective:   VITALS:   Vitals:   06/25/22 1623 06/25/22 2145 06/26/22 0140 06/26/22 0547  BP: 121/62 (!) 97/49 109/61 (!) 110/58  Pulse: 78 62 75 88  Resp: 18 16 19 19   Temp: 97.7 F (36.5 C) 98.2 F (36.8 C) 98.7 F (37.1 C) 99.1 F (37.3 C)  TempSrc: Oral Oral Oral Oral  SpO2: 92% 91% 91% 91%  Weight:      Height:        Patient sitting up in bed. NAD.  Neurologically intact ABD soft Neurovascular intact Sensation intact distally Intact pulses distally Compartment soft Capillary refill < 2 seconds.  Motor function intact in LUE.  Dressing removed and aquacel dressing placed.   Lab Results  Component Value Date   WBC 6.9 06/09/2022   HGB 12.9 06/09/2022   HCT 42.8 06/09/2022   MCV 86.6 06/09/2022   PLT 269 06/09/2022   BMET    Component Value Date/Time   NA 140 06/09/2022 1448   NA 143 07/22/2021 1307   K 4.3 06/09/2022 1448   CL 101 06/09/2022 1448   CO2 32 06/09/2022 1448   GLUCOSE 56 (L) 06/09/2022 1448   BUN 10 06/09/2022 1448   BUN 16 07/22/2021 1307   CREATININE 0.81 06/09/2022 1448   CREATININE 0.78 10/30/2019 1554   CALCIUM 9.5 06/09/2022 1448   EGFR 65 07/22/2021 1307   GFRNONAA >60 06/09/2022 1448   GFRNONAA 71 10/30/2019 1554     Assessment/Plan: 1 Day Post-Op   Principal Problem:   H/O total shoulder replacement, left   PO pain control PT/OT: OT to come by today.  Dispo: D/c home once cleared with OT and nausea improved.   Clois Dupes, PA-C 06/26/2022, 9:04 AM   [facility]  [place] [address], [place], [place] [address] Phone: [phone] [email or web address] Facebook  [employer]

## 2022-06-26 NOTE — TOC CM/SW Note (Signed)
Transition of Care [facility]) Screening Note  Patient Details  Name: [name] Date of Birth: 01-03-1941  Transition of [facility]) CM/SW Contact:    Ewing Schlein, LCSW Phone Number: 06/26/2022, 2:32 PM  Transition of Care Department [facility]) has reviewed patient and no TOC needs have been identified at this time. We will continue to monitor patient advancement through interdisciplinary progression rounds. If new patient transition needs arise, please place a TOC consult.

## 2022-06-26 NOTE — Progress Notes (Signed)
   06/26/22 0902  Oxygen Therapy/Pulse Ox  O2 Device (S)  Room Air (Placed pt on 2 L [place], SP02 improved to 97% post change.)  O2 Therapy Room air  SpO2 (S)  90 %  Safety Instructions Yes (Comment)

## 2022-06-26 NOTE — Progress Notes (Signed)
Pt alert and oriented. Surgical dressing clean, dry and intact. Pt and her daughter  has no questions or queries regarding discharge instructions. Belongings sent home with pt pt.

## 2022-06-27 NOTE — Discharge Summary (Addendum)
Physician Discharge Summary  Patient ID: [name] MRN: [id number] DOB/AGE: 1940/05/09 82 y.o.  Admit date: 06/25/2022 Discharge date: 06/26/2022  Admission Diagnoses:  H/O total shoulder replacement, left  Discharge Diagnoses:  Principal Problem:   H/O total shoulder replacement, left   Past Medical History:  Diagnosis Date   Adenomatous colon polyp    Allergy    Anxiety    Arthritis    Asthma    Carotid artery disease (HCC)    carotid US 02/2017: bilat ICA 1-39%   Chronic diastolic CHF    Echo 02/2017: EF 65-70, Gr 2 DD, mild MS (mean 5), PASP 44   COPD    pt is unsure if has been officially diagnosed   Coronary artery disease    CABG '09- cathed 12/09, 9/10, 6/11, 3/14 and 12/13/16- medical Rx // cath 12/2016 - 2/3 grafts patent >> med Rx // Myoview 12/17: low risk   Diabetes mellitus    Dyspnea    with exertion   Gastroesophageal reflux disease    Headache    Hiatal hernia    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 female who was admitted 06/25/2022 with a diagnosis of H/O total shoulder replacement, left and went to the operating room on 06/25/2022 and underwent the above named procedures.    She was given perioperative antibiotics:  Anti-infectives (From admission, onward)    Start     Dose/Rate Route Frequency Ordered Stop   06/25/22 1500  ceFAZolin (ANCEF) IVPB 2g/100 mL premix        2 g 200 mL/hr over 30 Minutes Intravenous Every 6 hours 06/25/22 1401 06/26/22 0210   06/25/22 0615  ceFAZolin (ANCEF) IVPB 2g/100 mL premix        2 g 200 mL/hr over 30 Minutes  Intravenous On call to O.R. 06/25/22 6578 06/25/22 0911   06/25/22 0615  ceFAZolin (ANCEF) IVPB 2g/100 mL premix  Status:  Discontinued        2 g 200 mL/hr over 30 Minutes Intravenous On call to O.R. 06/25/22 0612 06/25/22 1349       POD#1 Patient taking tylenol for pain relief. She had some nausea that was resolved with zofran. She cleared OT and was discharged home. Patient to follow-up in 2 weeks with Dr. Ranell Patrick.   She benefited maximally from the hospital stay and there were no complications.    Recent vital signs:  Vitals:   06/26/22 1332 06/26/22 1451  BP:  95/62  Pulse:  (!) 51  Resp:  16  Temp: 98.2 F (36.8 C) 98.3 F (36.8 C)  SpO2:  92%    Recent laboratory studies:  Lab Results  Component Value Date   HGB 12.9 06/09/2022   HGB 12.4 03/31/2022   HGB 12.9 02/03/2022   Lab Results  Component Value Date   WBC 6.9 06/09/2022   PLT 269 06/09/2022   Lab Results  Component Value Date   INR 1.1 07/28/2021   Lab Results  Component Value Date   NA 140  06/09/2022   K 4.3 06/09/2022   CL 101 06/09/2022   CO2 32 06/09/2022   BUN 10 06/09/2022   CREATININE 0.81 06/09/2022   GLUCOSE 56 (L) 06/09/2022     Allergies as of 06/26/2022       Reactions   Amlodipine Other (See Comments)   hallucinations    Banana Nausea And Vomiting   Stomach pumped   Co Q10 [coenzyme Q10] Other (See Comments)   Body cramps   Codeine Other (See Comments)   Hallucinate, loose identity and don't know who I am   Insulin Aspart Other (See Comments)   Unknown   Lisinopril Other (See Comments)   nose bleed   Metformin And Related Nausea And Vomiting   Morphine Other (See Comments)   Can not function, it immobilizes me    Pentazocine Nausea And Vomiting   Pravastatin Other (See Comments)   Hands locked up   Repatha [evolocumab] Other (See Comments)   myalgias   Statins Other (See Comments)   Muscle cramps   Sulfa Antibiotics Swelling   Sulfonamide Derivatives Swelling    Tramadol Hcl Other (See Comments)   Dizziness, cant function         Medication List     TAKE these medications    acetaminophen 325 MG tablet Commonly known as: TYLENOL Take 650 mg by mouth every 6 (six) hours as needed for moderate pain or headache.   albuterol 108 (90 Base) MCG/ACT inhaler Commonly known as: VENTOLIN HFA Inhale 2 puffs into the lungs every 6 (six) hours as needed. For shortness of breath. What changed: Another medication with the same name was changed. Make sure you understand how and when to take each.   albuterol (2.5 MG/3ML) 0.083% nebulizer solution Commonly known as: PROVENTIL USE 1 VIAL IN NEBULIZER 4 TIMES DAILY What changed: See the new instructions.   aspirin EC 81 MG tablet Take 1 tablet (81 mg total) by mouth daily.   baclofen 20 MG tablet Commonly known as: LIORESAL TAKE 2 TABLETS BY MOUTH TWICE A DAY   benzonatate 200 MG capsule Commonly known as: TESSALON Take 1 capsule (200 mg total) by mouth 3 (three) times daily as needed for cough.   calcium carbonate 500 MG chewable tablet Commonly known as: TUMS - dosed in mg elemental calcium Chew 1 tablet by mouth daily as needed for indigestion or heartburn.   diltiazem 240 MG 24 hr capsule Commonly known as: CARDIZEM CD Take 1 capsule (240 mg total) by mouth daily.   divalproex 250 MG 24 hr tablet Commonly known as: Depakote ER Take 1 tablet (250 mg total) by mouth at bedtime.   fluticasone 50 MCG/ACT nasal spray Commonly known as: FLONASE SPRAY 2 SPRAYS INTO EACH NOSTRIL EVERY DAY What changed: See the new instructions.   FreeStyle Libre 2 Reader Devi 1 Device by Does not apply route every 14 (fourteen) days.   FreeStyle Libre 2 Sensor Misc 1 Device by Does not apply route every 14 (fourteen) days.   furosemide 40 MG tablet Commonly known as: LASIX Take 1 tablet (40 mg total) by mouth daily. What changed: when to take this   glucose blood test strip Use as instructed to test  blood sugars up to 4 times daily   HYDROcodone-acetaminophen 5-325 MG tablet Commonly known as: Norco Take 1 tablet by mouth every 6 (six) hours as needed for moderate pain.   insulin lispro protamine-lispro (75-25) 100 UNIT/ML Susp injection Commonly known as: HUMALOG 75/25 MIX Inject  18 Units into the skin 2 (two) times daily with a meal. 10 units at lunch depending on bs reading What changed:  how much to take when to take this additional instructions   INSULIN SYRINGE .3CC/29GX1" 29G X 1" 0.3 ML Misc 1 Device by Does not apply route in the morning and at bedtime.   isosorbide mononitrate 60 MG 24 hr tablet Commonly known as: IMDUR Take one tablet ( 60 mg ) twice daily 8 hours apart.   meclizine 25 MG tablet Commonly known as: ANTIVERT Take 25 mg by mouth 2 (two) times daily as needed for dizziness.   metoprolol succinate 100 MG 24 hr tablet Commonly known as: TOPROL-XL TAKE 1 TABLET BY MOUTH EVERY DAY   multivitamin with minerals Tabs tablet Take 1 tablet by mouth daily.   Nexlizet 180-10 MG Tabs Generic drug: Bempedoic Acid-Ezetimibe Take 1 tablet by mouth daily.   nitroGLYCERIN 0.4 MG SL tablet Commonly known as: NITROSTAT Place 1 tablet (0.4 mg total) under the tongue every 5 (five) minutes as needed for chest pain.   ondansetron 4 MG tablet Commonly known as: Zofran Take 1 tablet (4 mg total) by mouth every 8 (eight) hours as needed for nausea or vomiting. What changed: Another medication with the same name was added. Make sure you understand how and when to take each.   ondansetron 4 MG tablet Commonly known as: Zofran Take 1 tablet (4 mg total) by mouth every 8 (eight) hours as needed for refractory nausea / vomiting, vomiting or nausea. What changed: You were already taking a medication with the same name, and this prescription was added. Make sure you understand how and when to take each.   ORION 4 inclisiran or placebo 300 mg/1.5 mL SQ injection Inject  300 mg into the skin every 6 (six) months.   pantoprazole 40 MG tablet Commonly known as: Protonix Take 1 tablet (40 mg total) by mouth daily. Take 30 minutes prior to a meal   spironolactone 25 MG tablet Commonly known as: ALDACTONE TAKE 1 TABLET BY MOUTH EVERY DAY What changed: when to take this   temazepam 30 MG capsule Commonly known as: RESTORIL Take 1 capsule (30 mg total) by mouth daily. What changed: when to take this   Trelegy Ellipta 200-62.5-25 MCG/ACT Aepb Generic drug: Fluticasone-Umeclidin-Vilant Inhale 1 puff into the lungs daily at 6 (six) AM.   VITAMIN B-12 IJ Inject 1,000 mcg as directed every 30 (thirty) days.   Vitamin D 50 MCG (2000 UT) tablet Take 4,000 Units by mouth daily.          WEIGHT BEARING   DO NOT reach behind your back or push up out of a chair with the operative arm.  Use a sling while you are up and around for comfort, may remove while seated.  Keep pillow propped behind the operative elbow.   CONSTIPATION  Constipation is defined medically as fewer than three stools per week and severe constipation as less than one stool per week.  Even if you have a regular bowel pattern at home, your normal regimen is likely to be disrupted due to multiple reasons following surgery.  Combination of anesthesia, postoperative narcotics, change in appetite and fluid intake all can affect your bowels.   YOU MUST use at least one of the following options; they are listed in order of increasing strength to get the job done.  They are all available over the counter, and you may need to use some, POSSIBLY even  all of these options:    Drink plenty of fluids (prune juice may be helpful) and high fiber foods Colace 100 mg by mouth twice a day  Senokot for constipation as directed and as needed Dulcolax (bisacodyl), take with full glass of water  Miralax (polyethylene glycol) once or twice a day as needed.  If you have tried all these things and are unable  to have a bowel movement in the first 3-4 days after surgery call either your surgeon or your primary doctor.    If you experience loose stools or diarrhea, hold the medications until you stool forms back up.  If your symptoms do not get better within 1 week or if they get worse, check with your doctor.  If you experience "the worst abdominal pain ever" or develop nausea or vomiting, please contact the office immediately for further recommendations for treatment.   ITCHING:  If you experience itching with your medications, try taking only a single pain pill, or even half a pain pill at a time.  You can also use Benadryl over the counter for itching or also to help with sleep.    MEDICATIONS:  See your medication summary on the "After Visit Summary" that nursing will review with you.  You may have some home medications which will be placed on hold until you complete the course of blood thinner medication.  It is important for you to complete the blood thinner medication as prescribed.  PRECAUTIONS:  If you experience chest pain or shortness of breath - call 911 immediately for transfer to the hospital emergency department.   If you develop a fever greater that 101 F, purulent drainage from wound, increased redness or drainage from wound, foul odor from the wound/dressing, or calf pain - CONTACT YOUR SURGEON.                                                   FOLLOW-UP APPOINTMENTS:  If you do not already have a post-op appointment, please call the office for an appointment to be seen by your surgeon.  Guidelines for how soon to be seen are listed in your "After Visit Summary", but are typically between 1-4 weeks after surgery.     MAKE SURE YOU:  Understand these instructions.  Get help right away if you are not doing well or get worse.    Thank you for letting us be a part of your medical care team.  It is a privilege we respect greatly.  We hope these instructions will help you stay on track  for a fast and full recovery!   Diagnostic Studies: DG Shoulder Left Port  Result Date: 06/25/2022 CLINICAL DATA:  Total left shoulder arthroplasty. EXAM: LEFT SHOULDER COMPARISON:  None Available. FINDINGS: Status post recent reverse total shoulder arthroplasty. On this single frontal view, normal hardware alignment. No perihardware lucency is seen to indicate hardware failure or loosening. Expected postoperative subacromial/subdeltoid and soft tissue air around the shoulder. Mild acromioclavicular peripheral osteophytosis. No acute fracture or dislocation. IMPRESSION: Status post recent reverse total shoulder arthroplasty without evidence of hardware failure. Electronically Signed   By: Neita Garnet M.D.   On: 06/25/2022 12:36    Disposition: Discharge disposition: 01-Home or Self Care       Discharge Instructions     Call MD / Call 911  Complete by: As directed    If you experience chest pain or shortness of breath, CALL 911 and be transported to the hospital emergency room.  If you develope a fever above 101 F, pus (white drainage) or increased drainage or redness at the wound, or calf pain, call your surgeon's office.   Call MD / Call 911   Complete by: As directed    If you experience chest pain or shortness of breath, CALL 911 and be transported to the hospital emergency room.  If you develope a fever above 101 F, pus (white drainage) or increased drainage or redness at the wound, or calf pain, call your surgeon's office.   Constipation Prevention   Complete by: As directed    Drink plenty of fluids.  Prune juice may be helpful.  You may use a stool softener, such as Colace (over the counter) 100 mg twice a day.  Use MiraLax (over the counter) for constipation as needed.   Constipation Prevention   Complete by: As directed    Drink plenty of fluids.  Prune juice may be helpful.  You may use a stool softener, such as Colace (over the counter) 100 mg twice a day.  Use MiraLax (over  the counter) for constipation as needed.   Diet - low sodium heart healthy   Complete by: As directed    Diet Carb Modified   Complete by: As directed    Discharge instructions   Complete by: As directed    Ice to the shoulder constantly.  Keep the incision covered and clean and dry for one week, then ok to get it wet in the shower. Leave the incision uncovered after one week and open to air  Do exercise as instructed several times per day.  DO NOT reach behind your back or push up out of a chair with the operative arm.  Use a sling while you are up and around for comfort, may remove while seated.  Keep pillow propped behind the operative elbow.   Driving restrictions   Complete by: As directed    No driving for 6 weeks   Increase activity slowly as tolerated   Complete by: As directed    Increase activity slowly as tolerated   Complete by: As directed    Lifting restrictions   Complete by: As directed    No lifting   Post-operative opioid taper instructions:   Complete by: As directed    POST-OPERATIVE OPIOID TAPER INSTRUCTIONS: It is important to wean off of your opioid medication as soon as possible. If you do not need pain medication after your surgery it is ok to stop day one. Opioids include: Codeine, Hydrocodone(Norco, Vicodin), Oxycodone(Percocet, oxycontin) and hydromorphone amongst others.  Long term and even short term use of opiods can cause: Increased pain response Dependence Constipation Depression Respiratory depression And more.  Withdrawal symptoms can include Flu like symptoms Nausea, vomiting And more Techniques to manage these symptoms Hydrate well Eat regular healthy meals Stay active Use relaxation techniques(deep breathing, meditating, yoga) Do Not substitute Alcohol to help with tapering If you have been on opioids for less than two weeks and do not have pain than it is ok to stop all together.  Plan to wean off of opioids This plan should  start within one week post op of your joint replacement. Maintain the same interval or time between taking each dose and first decrease the dose.  Cut the total daily intake of opioids by one tablet  each day Next start to increase the time between doses. The last dose that should be eliminated is the evening dose.      Post-operative opioid taper instructions:   Complete by: As directed    POST-OPERATIVE OPIOID TAPER INSTRUCTIONS: It is important to wean off of your opioid medication as soon as possible. If you do not need pain medication after your surgery it is ok to stop day one. Opioids include: Codeine, Hydrocodone(Norco, Vicodin), Oxycodone(Percocet, oxycontin) and hydromorphone amongst others.  Long term and even short term use of opiods can cause: Increased pain response Dependence Constipation Depression Respiratory depression And more.  Withdrawal symptoms can include Flu like symptoms Nausea, vomiting And more Techniques to manage these symptoms Hydrate well Eat regular healthy meals Stay active Use relaxation techniques(deep breathing, meditating, yoga) Do Not substitute Alcohol to help with tapering If you have been on opioids for less than two weeks and do not have pain than it is ok to stop all together.  Plan to wean off of opioids This plan should start within one week post op of your joint replacement. Maintain the same interval or time between taking each dose and first decrease the dose.  Cut the total daily intake of opioids by one tablet each day Next start to increase the time between doses. The last dose that should be eliminated is the evening dose.           Follow-up Information     Beverely Low, MD. Call in 2 week(s).   Specialty: Orthopedic Surgery Why: call [phone] for appt in two weeks Contact information: [address] [place] [address] [phone]                  Signed: Clois Dupes 06/27/2022, 7:45 AM

## 2022-06-28 ENCOUNTER — Ambulatory Visit: Payer: Medicare Other | Admitting: Neurology

## 2022-06-28 ENCOUNTER — Encounter (HOSPITAL_COMMUNITY): Payer: Self-pay | Admitting: Orthopedic Surgery

## 2022-06-28 ENCOUNTER — Telehealth: Payer: Self-pay

## 2022-06-28 ENCOUNTER — Encounter: Payer: Self-pay | Admitting: Neurology

## 2022-06-28 NOTE — Anesthesia Postprocedure Evaluation (Signed)
Anesthesia Post Note  Patient: TIVA POPKO  Procedure(s) Performed: REVERSE SHOULDER ARTHROPLASTY (Left: Shoulder)     Patient location during evaluation: PACU Anesthesia Type: Regional and General Level of consciousness: awake and alert Pain management: pain level controlled Vital Signs Assessment: post-procedure vital signs reviewed and stable Respiratory status: spontaneous breathing, nonlabored ventilation, respiratory function stable and patient connected to nasal cannula oxygen Cardiovascular status: blood pressure returned to baseline and stable Postop Assessment: no apparent nausea or vomiting Anesthetic complications: no   No notable events documented.  Last Vitals:  Vitals:   06/26/22 1332 06/26/22 1451  BP:  95/62  Pulse:  (!) 51  Resp:  16  Temp: 36.8 C 36.8 C  SpO2:  92%    Last Pain:  Vitals:   06/26/22 1451  TempSrc: Oral  PainSc:                  Chozen Latulippe S

## 2022-06-28 NOTE — Transitions of Care (Post Inpatient/ED Visit) (Signed)
   06/28/2022  Name: [name] MRN: [id number] DOB: [birth year]  Today's TOC FU Call Status: Today's TOC FU Call Status:: Unsuccessul Call (1st Attempt) Unsuccessful Call (1st Attempt) Date: 06/28/22  Attempted to reach the patient regarding the most recent Inpatient/ED visit.  Follow Up Plan: Additional outreach attempts will be made to reach the patient to complete the Transitions of Care (Post Inpatient/ED visit) call.   Signature Agnes Lawrence, CMA (AAMA)  [facility] [phone]

## 2022-06-30 NOTE — Transitions of Care (Post Inpatient/ED Visit) (Signed)
   06/30/2022  Name: [name] MRN: [id number] DOB: 1940-04-02  Today's TOC FU Call Status: Today's TOC FU Call Status:: Unsuccessful Call (2nd Attempt) Unsuccessful Call (1st Attempt) Date: 06/28/22 Unsuccessful Call (2nd Attempt) Date: 06/30/22  Attempted to reach the patient regarding the most recent Inpatient/ED visit.  Follow Up Plan: Additional outreach attempts will be made to reach the patient to complete the Transitions of Care (Post Inpatient/ED visit) call.   Signature   Agnes Lawrence, CMA (AAMA)  [facility] [phone]

## 2022-07-06 NOTE — Transitions of Care (Post Inpatient/ED Visit) (Signed)
07/06/2022  Name: [name] MRN: [id number] DOB: 1940-05-10  Today's TOC FU Call Status: Today's TOC FU Call Status:: Successful TOC FU Call Competed Unsuccessful Call (1st Attempt) Date: 06/28/22 Unsuccessful Call (2nd Attempt) Date: 06/30/22 [facility] FU Call Complete Date: 07/06/22  Transition Care Management Follow-up Telephone Call Date of Discharge: 06/26/22 Discharge Facility: [facility]) Type of Discharge: Inpatient Admission Primary Inpatient Discharge Diagnosis:: H/O total shoulder replacement, left How have you been since you were released from the hospital?: Better Any questions or concerns?: No  Items Reviewed: Did you receive and understand the discharge instructions provided?: Yes Medications obtained,verified, and reconciled?: Yes (Medications Reviewed) Any new allergies since your discharge?: No Dietary orders reviewed?: NA Do you have support at home?: Yes People in Home: significant other  Medications Reviewed Today: Medications Reviewed Today     Reviewed by Leigh Aurora, CMA (Certified Medical Assistant) on 07/06/22 at 1531  Med List Status: <None>   Medication Order Taking? Sig Documenting Provider Last Dose Status Informant  acetaminophen (TYLENOL) 325 MG tablet [id number] No Take 650 mg by mouth every 6 (six) hours as needed for moderate pain or headache. [provider] Past Week Active Self           Med Note Heath Lark Apr 01, 2022  8:18 AM)    albuterol (PROVENTIL HFA;VENTOLIN HFA) 108 (90 BASE) MCG/ACT inhaler [id number] No Inhale 2 puffs into the lungs every 6 (six) hours as needed. For shortness of breath. Storm Frisk, MD Taking Active Self  albuterol (PROVENTIL) (2.5 MG/3ML) 0.083% nebulizer solution [id number] No USE 1 VIAL IN NEBULIZER 4 TIMES DAILY  Patient taking differently: Take 2.5 mg by nebulization every 6 (six) hours as needed for wheezing or shortness of breath.   Chilton Greathouse, MD Taking Active  Self  aspirin EC 81 MG tablet [id number] No Take 1 tablet (81 mg total) by mouth daily. Tereso Newcomer T, PA-C 06/18/2022 Active Self  baclofen (LIORESAL) 20 MG tablet [id number] No TAKE 2 TABLETS BY MOUTH TWICE A DAY Burns, Bobette Mo, MD Past Week Active Self  Bempedoic Acid-Ezetimibe (NEXLIZET) 180-10 MG TABS [id number] No Take 1 tablet by mouth daily. Beatrice Lecher, PA-C Past Week Active Self  benzonatate (TESSALON) 200 MG capsule [id number] No Take 1 capsule (200 mg total) by mouth 3 (three) times daily as needed for cough. Chilton Greathouse, MD Past Week Active Self  calcium carbonate (TUMS - DOSED IN MG ELEMENTAL CALCIUM) 500 MG chewable tablet [id number] No Chew 1 tablet by mouth daily as needed for indigestion or heartburn. [provider] Past Week Active Self  Cholecalciferol (VITAMIN D) 50 MCG (2000 UT) tablet [id number] No Take 4,000 Units by mouth daily. [provider] Past Week Active Self  Continuous Blood Gluc Receiver (FREESTYLE LIBRE 2 READER) DEVI [id number] No 1 Device by Does not apply route every 14 (fourteen) days. Shamleffer, Konrad Dolores, MD Past Week Active Self  Continuous Blood Gluc Sensor (FREESTYLE LIBRE 2 SENSOR) MISC [id number] No 1 Device by Does not apply route every 14 (fourteen) days. Shamleffer, Konrad Dolores, MD Past Week Active Self  Cyanocobalamin (VITAMIN B-12 IJ) [id number] No Inject 1,000 mcg as directed every 30 (thirty) days. [provider] Past Week Active Self  diltiazem (CARDIZEM CD) 240 MG 24 hr capsule [id number] No Take 1 capsule (240 mg total) by mouth daily. Pincus Sanes, MD 06/24/2022 1600 Active Self  Med Note Phebe Colla Apr 05, 2022 10:54 AM) Last dispense record is from Sept 2023  divalproex (DEPAKOTE ER) 250 MG 24 hr tablet [id number] No Take 1 tablet (250 mg total) by mouth at bedtime. Drema Dallas, DO 06/24/2022 1600 Active Self  fluticasone (FLONASE) 50 MCG/ACT nasal spray [id number] No SPRAY 2  SPRAYS INTO EACH NOSTRIL EVERY DAY  Patient taking differently: Place 2 sprays into both nostrils daily as needed for allergies.   Chilton Greathouse, MD Taking Active Self  Fluticasone-Umeclidin-Vilant (TRELEGY ELLIPTA) 200-62.5-25 MCG/ACT AEPB [id number] No Inhale 1 puff into the lungs daily at 6 (six) AM. Parrett, Virgel Bouquet, NP Taking Active Self           Med Note Phebe Colla Apr 05, 2022 10:55 AM) Last dispensed March 2023  furosemide (LASIX) 40 MG tablet [id number] No Take 1 tablet (40 mg total) by mouth daily.  Patient taking differently: Take 40 mg by mouth every other day.   Pincus Sanes, MD 06/23/2022 Expired 06/23/22 2359 Self           Med Note Hart Rochester, BROOKE C   Mon Jun 07, 2022  3:24 PM)    glucose blood test strip [id number] No Use as instructed to test blood sugars up to 4 times daily Burns, Bobette Mo, MD Taking Active Self  HYDROcodone-acetaminophen (NORCO) 5-325 MG tablet [id number]  Take 1 tablet by mouth every 6 (six) hours as needed for moderate pain. Beverely Low, MD  Active   insulin lispro protamine-lispro (HUMALOG 75/25 MIX) (75-25) 100 UNIT/ML SUSP injection [id number] No Inject 18 Units into the skin 2 (two) times daily with a meal. 10 units at lunch depending on bs reading  Patient taking differently: Inject 10-18 Units into the skin See admin instructions. Inject 16  units under the skin at breakfast and 16 units in the evening  PT does not takes if has not eaten   Shamleffer, Konrad Dolores, MD 06/24/2022 1600 Active Self           Med Note Rudi Heap, GWENDOLYN D   Fri Jun 25, 2022  7:01 AM) 10 units sq  Insulin Syringe-Needle U-100 (INSULIN SYRINGE .3CC/29GX1") 29G X 1" 0.3 ML MISC [id number] No 1 Device by Does not apply route in the morning and at bedtime. Shamleffer, Konrad Dolores, MD Past Week Active Self  isosorbide mononitrate (IMDUR) 60 MG 24 hr tablet [id number] No Take one tablet ( 60 mg ) twice daily 8 hours apart. Tereso Newcomer T, [place] 06/24/2022 1600  Active Self           Med Note Darryl Nestle   Tue Oct 27, 2021 11:29 AM)    meclizine (ANTIVERT) 25 MG tablet [id number] No Take 25 mg by mouth 2 (two) times daily as needed for dizziness. [provider] Taking Active Self           Med Note Phebe Colla Apr 05, 2022 10:57 AM) No fill record in past 12 months  metoprolol succinate (TOPROL-XL) 100 MG 24 hr tablet [id number] No TAKE 1 TABLET BY MOUTH EVERY DAY Nahser, Deloris Ping, MD 06/24/2022 1600 Active Self  Multiple Vitamin (MULTIVITAMIN WITH MINERALS) TABS [id number] No Take 1 tablet by mouth daily. [provider] Past Week Active Self  nitroGLYCERIN (NITROSTAT) 0.4 MG SL tablet [id number] No Place 1 tablet (0.4 mg total) under the tongue every 5 (five) minutes as needed for chest pain. Verdis Prime  W, MD Unknown Active Self           Med Note Heath Lark Apr 01, 2022  8:19 AM)    ondansetron (ZOFRAN) 4 MG tablet [id number] No Take 1 tablet (4 mg total) by mouth every 8 (eight) hours as needed for nausea or vomiting. Pincus Sanes, MD Unknown Active Self  ondansetron (ZOFRAN) 4 MG tablet [id number]  Take 1 tablet (4 mg total) by mouth every 8 (eight) hours as needed for refractory nausea / vomiting, vomiting or nausea. Beverely Low, MD  Active   pantoprazole (PROTONIX) 40 MG tablet [id number] No Take 1 tablet (40 mg total) by mouth daily. Take 30 minutes prior to a meal Pincus Sanes, MD 06/24/2022 Active Self  spironolactone (ALDACTONE) 25 MG tablet [id number] No TAKE 1 TABLET BY MOUTH EVERY DAY  Patient taking differently: Take 25 mg by mouth every evening.   Nahser, Deloris Ping, MD 06/23/2022 Active Self  Study - ORION 4 - inclisiran 300 mg/1.40mL or placebo SQ injection (PI-Stuckey) [id number] No Inject 300 mg into the skin every 6 (six) months. [provider] 06/21/2022 Active Self  Study - ORION 4 - inclisiran 300 mg/1.51mL or placebo SQ injection (PI-Stuckey) [id number]   Herby Abraham,  MD  Active   temazepam (RESTORIL) 30 MG capsule [id number] No Take 1 capsule (30 mg total) by mouth daily.  Patient taking differently: Take 30 mg by mouth at bedtime.   Pincus Sanes, MD 06/24/2022 Active Self            Home Care and Equipment/Supplies: Were Home Health Services Ordered?: NA Any new equipment or medical supplies ordered?: NA  Functional Questionnaire: Do you need assistance with bathing/showering or dressing?: No Do you need assistance with meal preparation?: No Do you need assistance with eating?: No Do you have difficulty maintaining continence: No Do you need assistance with getting out of bed/getting out of a chair/moving?: No Do you have difficulty managing or taking your medications?: No  Follow up appointments reviewed: PCP Follow-up appointment confirmed?: NA Specialist Hospital Follow-up appointment confirmed?: Yes Date of Specialist follow-up appointment?: 07/08/22 Follow-Up Specialty Provider:: [facility] Do you need transportation to your follow-up appointment?: No Do you understand care options if your condition(s) worsen?: Yes-patient verbalized understanding    SIGNATURE Agnes Lawrence, CMA (AAMA)  [facility] [phone]

## 2022-07-29 ENCOUNTER — Other Ambulatory Visit: Payer: Self-pay | Admitting: Neurology

## 2022-07-29 DIAGNOSIS — Z79899 Other long term (current) drug therapy: Secondary | ICD-10-CM

## 2022-07-29 NOTE — Telephone Encounter (Signed)
Patient advised of Dr. Everlena Cooper, OK to refill but patient must have liver function tests/hepatic panel checked.      Order added patient will try to stop by next Thursday to have lab drawn.

## 2022-07-29 NOTE — Addendum Note (Signed)
Addended by: Leida Lauth on: 07/29/2022 12:49 PM   Modules accepted: Orders

## 2022-08-02 ENCOUNTER — Other Ambulatory Visit: Payer: Self-pay | Admitting: Pulmonary Disease

## 2022-08-02 ENCOUNTER — Other Ambulatory Visit: Payer: Self-pay | Admitting: Neurology

## 2022-08-05 ENCOUNTER — Other Ambulatory Visit (INDEPENDENT_AMBULATORY_CARE_PROVIDER_SITE_OTHER): Payer: Medicare Other

## 2022-08-05 DIAGNOSIS — Z79899 Other long term (current) drug therapy: Secondary | ICD-10-CM

## 2022-08-05 DIAGNOSIS — R519 Headache, unspecified: Secondary | ICD-10-CM

## 2022-08-05 LAB — COMPREHENSIVE METABOLIC PANEL
ALT: 7 U/L (ref 0–35)
AST: 14 U/L (ref 0–37)
Albumin: 4.2 g/dL (ref 3.5–5.2)
Alkaline Phosphatase: 82 U/L (ref 39–117)
BUN: 28 mg/dL — ABNORMAL HIGH (ref 6–23)
CO2: 32 mEq/L (ref 19–32)
Calcium: 9.9 mg/dL (ref 8.4–10.5)
Chloride: 102 mEq/L (ref 96–112)
Creatinine, Ser: 0.92 mg/dL (ref 0.40–1.20)
GFR: 57.45 mL/min — ABNORMAL LOW (ref >60.00)
Glucose, Bld: 134 mg/dL — ABNORMAL HIGH (ref 70–99)
Potassium: 4.5 mEq/L (ref 3.5–5.1)
Sodium: 141 mEq/L (ref 135–145)
Total Bilirubin: 0.4 mg/dL (ref 0.2–1.2)
Total Protein: 7.5 g/dL (ref 6.0–8.3)

## 2022-08-05 LAB — CBC
HCT: 37.7 % (ref 36.0–46.0)
Hemoglobin: 12 g/dL (ref 12.0–15.0)
MCHC: 31.7 g/dL (ref 30.0–36.0)
MCV: 81.5 fl (ref 78.0–100.0)
Platelets: 327 10*3/uL (ref 150.0–400.0)
RBC: 4.63 Mil/uL (ref 3.87–5.11)
RDW: 15.8 % — ABNORMAL HIGH (ref 11.5–15.5)
WBC: 6.7 10*3/uL (ref 4.0–10.5)

## 2022-08-05 LAB — HEPATIC FUNCTION PANEL
ALT: 7 U/L (ref 0–35)
AST: 14 U/L (ref 0–37)
Albumin: 4.2 g/dL (ref 3.5–5.2)
Alkaline Phosphatase: 82 U/L (ref 39–117)
Bilirubin, Direct: 0.1 mg/dL (ref 0.0–0.3)
Total Bilirubin: 0.4 mg/dL (ref 0.2–1.2)
Total Protein: 7.5 g/dL (ref 6.0–8.3)

## 2022-08-17 ENCOUNTER — Telehealth: Payer: Self-pay

## 2022-08-18 ENCOUNTER — Telehealth: Payer: Self-pay | Admitting: Internal Medicine

## 2022-08-18 NOTE — Telephone Encounter (Signed)
Pt called Wanting to know what she need to do pt been having nausea, no appetite, and loose stool for 2 days. Please advise.

## 2022-08-23 ENCOUNTER — Telehealth: Payer: Self-pay

## 2022-08-23 LAB — HM DIABETES EYE EXAM

## 2022-08-23 NOTE — Telephone Encounter (Signed)
Patient left vm stating that she is taking 10 units of Humalog in morning and evening. Has low BS in the morning mostly fasting.  After she is up for about 3-4 hours she has lows again between 60-69. Patient feels that it's up and down as far as the numbers. She has been doing the 15/15 rule when her sugars drop to get it back up. I attempted to call patient again to get more information but she was not home.  She did state that this has been happening for 2 weeks. Please send to pool as I am heading out for the day.

## 2022-08-23 NOTE — Telephone Encounter (Signed)
LMTCB and provide information regarding BS concerns.

## 2022-08-23 NOTE — Telephone Encounter (Signed)
[name] is aware of recommendation

## 2022-08-24 NOTE — Telephone Encounter (Signed)
Appointment fixed in Epic

## 2022-08-25 ENCOUNTER — Ambulatory Visit: Payer: Self-pay | Admitting: Licensed Clinical Social Worker

## 2022-08-25 NOTE — Patient Instructions (Signed)
Visit Information  Thank you for taking time to visit with me today. Please don't hesitate to contact me if I can be of assistance to you.   Following are the goals we discussed today:   Goals Addressed             This Visit's Progress    Patient Stated she has some financial challenges. She has difficulty in paying for some of her medications       Interventions:  Spoke with client about client needs Discussed program support with RN, LCSW, Pharmacist Client said she has had some difficulty in managing Diabetes.  She said her sugar levels are fluctuating.  She said she has called PCP office to talk with representative at PCP office about these issues Client said her medications can be expensive.  She said that she obtained two prescriptions recently and her part to pay for these two prescriptions was about $ 200.00.  She said she is planning on talking with her Pharmacist about medication assistance program support.  She plans to ask her pharmacist to print out information for her to read about  medication assistance program support Discussed family support. She said she has reduced family support at this time Discussed pain issues of client Provided counseling support for client.  Informed client of RN support through Care Coordination program Encouraged client to call LCSW for SW support as needed at [phone]          Our next appointment is by telephone on 09/20/22 at 9:00 AM   Please call the care guide team at [phone] if you need to cancel or reschedule your appointment.   If you are experiencing a Mental Health or Behavioral Health Crisis or need someone to talk to, please go to [facility] [address], [place] [phone])   The patient verbalized understanding of instructions, educational materials, and care plan provided today and DECLINED offer to receive copy of patient instructions, educational materials, and  care plan.   The patient has been provided with contact information for the care management team and has been advised to call with any health related questions or concerns.   Kelton Pillar.Aren Cherne MSW, LCSW Licensed Visual merchandiser [facility] Care Management [phone]

## 2022-08-25 NOTE — Patient Outreach (Signed)
  Care Coordination   Initial Visit Note   08/25/2022 Name: [name] MRN: [id number] DOB: 1940/03/07  [name] is a 82 y.o. year old female who sees Burns, Bobette Mo, MD for primary care. I spoke with  [name] by phone today.  What matters to the patients health and wellness today?  Client has financial challenges. She has difficulty in   paying for some of her medications     Goals Addressed             This Visit's Progress    Patient Stated she has some financial challenges. She has difficulty in paying for some of her medications       Interventions:  Spoke with client about client needs Discussed program support with RN, LCSW, Pharmacist Client said she has had some difficulty in managing Diabetes.  She said her sugar levels are fluctuating.  She said she has called PCP office to talk with representative at PCP office about these issues Client said her medications can be expensive.  She said that she obtained two prescriptions recently and her part to pay for these two prescriptions was about $ 200.00.  She said she is planning on talking with her Pharmacist about medication assistance program support.  She plans to ask her pharmacist to print out information for her to read about  medication assistance program support Discussed family support. She said she has reduced family support at this time Discussed pain issues of client Provided counseling support for client.  Informed client of RN support through Care Coordination program Encouraged client to call LCSW for SW support as needed at [phone]          SDOH assessments and interventions completed:  Yes  SDOH Interventions Today    Flowsheet Row Most Recent Value  SDOH Interventions   Depression Interventions/Treatment  Counseling  Physical Activity Interventions Other (Comments)  [may have mobility issues]  Stress Interventions Provide Counseling  [client has some financial challenges. she has  some difficulty paying for her medications]        Care Coordination Interventions:  Yes, provided   Interventions Today    Flowsheet Row Most Recent Value  Chronic Disease   Chronic disease during today's visit Other  [spoke with client about client needs]  General Interventions   General Interventions Discussed/Reviewed General Interventions Discussed, [employer] program support with RN, LCSW, Pharmacist]  Exercise Interventions   Exercise Discussed/Reviewed Physical Activity  Education Interventions   Education Provided Provided Education  Provided Verbal Education On [employer] said she plans to talk with her pharmacist about medication assistance program]  Pharmacy Interventions   Pharmacy Dicussed/Reviewed Pharmacy Topics Discussed  [client spoke of financial challenges in paying for some of her medications]        Follow up plan: Follow up call scheduled for 09/20/22 at 9:00 AM     Encounter Outcome:  Pt. Visit Completed   Kelton Pillar.Sherin Murdoch MSW, LCSW Licensed Visual merchandiser [facility] Care Management [phone]

## 2022-08-30 ENCOUNTER — Ambulatory Visit (INDEPENDENT_AMBULATORY_CARE_PROVIDER_SITE_OTHER): Payer: Medicare Other | Admitting: Family Medicine

## 2022-08-30 ENCOUNTER — Encounter: Payer: Self-pay | Admitting: Family Medicine

## 2022-08-30 VITALS — BP 138/76 | HR 82 | Temp 98.2°F | Resp 20 | Ht 62.0 in | Wt 163.0 lb

## 2022-08-30 DIAGNOSIS — R300 Dysuria: Secondary | ICD-10-CM

## 2022-08-30 DIAGNOSIS — K580 Irritable bowel syndrome with diarrhea: Secondary | ICD-10-CM

## 2022-08-30 DIAGNOSIS — R42 Dizziness and giddiness: Secondary | ICD-10-CM

## 2022-08-30 DIAGNOSIS — N3000 Acute cystitis without hematuria: Secondary | ICD-10-CM

## 2022-08-30 LAB — POCT URINALYSIS DIPSTICK
Bilirubin, UA: NEGATIVE
Blood, UA: NEGATIVE
Glucose, UA: NEGATIVE
Ketones, UA: POSITIVE
Nitrite, UA: POSITIVE
Protein, UA: POSITIVE — AB
Spec Grav, UA: 1.025
Urobilinogen, UA: NEGATIVE U/dL — AB
pH, UA: 5

## 2022-08-30 MED ORDER — CEFDINIR 300 MG PO CAPS
300.0000 mg | ORAL_CAPSULE | Freq: Two times a day (BID) | ORAL | 0 refills | Status: AC
Start: 2022-08-30 — End: 2022-09-06

## 2022-08-30 NOTE — Progress Notes (Signed)
Assessment & Plan:  1. Acute cystitis without hematuria - cefdinir (OMNICEF) 300 MG capsule; Take 1 capsule (300 mg total) by mouth 2 (two) times daily for 7 days.  Dispense: 14 capsule; Refill: 0  2. Dysuria Results for orders placed or performed in visit on 08/30/22  POCT urinalysis dipstick  Result Value Ref Range   Color, UA dark amber    Clarity, UA clear    Glucose, UA Negative Negative   Bilirubin, UA neg    Ketones, UA pos    Spec Grav, UA 1.025 1.010 - 1.025   Blood, UA neg    pH, UA 5.0 5.0 - 8.0   Protein, UA Positive (A) Negative   Urobilinogen, UA negative (A) 0.2 or 1.0 E.U./dL   Nitrite, UA pos    Leukocytes, UA Trace (A) Negative   Appearance     Odor    - POCT urinalysis dipstick - Urine Culture  3. Irritable bowel syndrome with diarrhea Education provided on a high-fiber eating plan.  Encouraged to add Benefiber or Metamucil as previously directed.  4. Vertigo Phone number and address provided to [facility] where patient was previously referred; advised to call and schedule an appointment.   Follow up plan: Return if symptoms worsen or fail to improve.  Deliah Boston, MSN, APRN, FNP-C  Subjective:  HPI: [name] is a 82 y.o. female presenting on 08/30/2022 for GI Problem (Loose bowels, "bubbling stomach" /X 3 weeks (tried tums and pepto) ), Dizziness (Taking meds daily - very unsteady on feet - this started about 3 months ago - has mentioned in past (thought meds may have caused it, not taking those meds any longer and still having the dizziness) ), Abdominal Pain, and Urinary Tract Infection (Burning with urination x 4 days )  Patient reports loose bowels (4-5/day), bubbling stomach, nausea, decreased appetite, and abdominal pain x 3 weeks.  She has been taking her pantoprazole as prescribed.  She has taken Tums and Pepto-Bismol as well.  None of these treatments have been effective.  Previously she was advised to add Benefiber or Metamucil, but she  has not done so.  Patient complains of dysuria. She has had symptoms for 4 days. Patient also complains of stomach ache. Patient denies back pain and fever. Patient does have a history of recurrent UTI.  Patient does not have a history of pyelonephritis.     ROS: Negative unless specifically indicated above in HPI.   Relevant past medical history reviewed and updated as indicated.   Allergies and medications reviewed and updated.   Current Outpatient Medications:    acetaminophen (TYLENOL) 325 MG tablet, Take 650 mg by mouth every 6 (six) hours as needed for moderate pain or headache., Disp: , Rfl:    albuterol (PROVENTIL HFA;VENTOLIN HFA) 108 (90 BASE) MCG/ACT inhaler, Inhale 2 puffs into the lungs every 6 (six) hours as needed. For shortness of breath., Disp: 1 Inhaler, Rfl: 0   albuterol (PROVENTIL) (2.5 MG/3ML) 0.083% nebulizer solution, USE 1 VIAL IN NEBULIZER 4 TIMES DAILY (Patient taking differently: Take 2.5 mg by nebulization every 6 (six) hours as needed for wheezing or shortness of breath.), Disp: 360 mL, Rfl: 1   aspirin EC 81 MG tablet, Take 1 tablet (81 mg total) by mouth daily., Disp: , Rfl:    baclofen (LIORESAL) 20 MG tablet, TAKE 2 TABLETS BY MOUTH TWICE A DAY, Disp: 360 tablet, Rfl: 2   Bempedoic Acid-Ezetimibe (NEXLIZET) 180-10 MG TABS, Take 1 tablet by  mouth daily., Disp: 90 tablet, Rfl: 3   calcium carbonate (TUMS - DOSED IN MG ELEMENTAL CALCIUM) 500 MG chewable tablet, Chew 1 tablet by mouth daily as needed for indigestion or heartburn., Disp: , Rfl:    Cholecalciferol (VITAMIN D) 50 MCG (2000 UT) tablet, Take 4,000 Units by mouth daily., Disp: , Rfl:    Continuous Blood Gluc Receiver (FREESTYLE LIBRE 2 READER) DEVI, 1 Device by Does not apply route every 14 (fourteen) days., Disp: 1 each, Rfl: 3   Continuous Blood Gluc Sensor (FREESTYLE LIBRE 2 SENSOR) MISC, 1 Device by Does not apply route every 14 (fourteen) days., Disp: 6 each, Rfl: 3   Cyanocobalamin (VITAMIN B-12  IJ), Inject 1,000 mcg as directed every 30 (thirty) days., Disp: , Rfl:    diltiazem (CARDIZEM CD) 240 MG 24 hr capsule, Take 1 capsule (240 mg total) by mouth daily., Disp: 90 capsule, Rfl: 2   divalproex (DEPAKOTE ER) 250 MG 24 hr tablet, TAKE 1 TABLET (250 MG TOTAL) BY MOUTH AT BEDTIME., Disp: 90 tablet, Rfl: 1   fluticasone (FLONASE) 50 MCG/ACT nasal spray, SPRAY 2 SPRAYS INTO EACH NOSTRIL EVERY DAY (Patient taking differently: Place 2 sprays into both nostrils daily as needed for allergies.), Disp: 48 mL, Rfl: 3   Fluticasone-Umeclidin-Vilant (TRELEGY ELLIPTA) 200-62.5-25 MCG/ACT AEPB, Inhale 1 puff into the lungs daily at 6 (six) AM., Disp: 1 each, Rfl: 0   furosemide (LASIX) 40 MG tablet, Take 1 tablet (40 mg total) by mouth daily. (Patient taking differently: Take 40 mg by mouth every other day.), Disp: 30 tablet, Rfl:    HYDROcodone-acetaminophen (NORCO) 5-325 MG tablet, Take 1 tablet by mouth every 6 (six) hours as needed for moderate pain., Disp: 20 tablet, Rfl: 0   insulin lispro protamine-lispro (HUMALOG 75/25 MIX) (75-25) 100 UNIT/ML SUSP injection, Inject 18 Units into the skin 2 (two) times daily with a meal. 10 units at lunch depending on bs reading (Patient taking differently: Inject 6 Units into the skin See admin instructions. Inject 6  units under the skin at breakfast and 6 units in the evening  PT does not takes if has not eaten), Disp: 40 mL, Rfl: 3   Insulin Syringe-Needle U-100 (INSULIN SYRINGE .3CC/29GX1") 29G X 1" 0.3 ML MISC, 1 Device by Does not apply route in the morning and at bedtime., Disp: 200 each, Rfl: 3   isosorbide mononitrate (IMDUR) 60 MG 24 hr tablet, Take one tablet ( 60 mg ) twice daily 8 hours apart., Disp: 180 tablet, Rfl: 3   meclizine (ANTIVERT) 25 MG tablet, Take 25 mg by mouth 2 (two) times daily as needed for dizziness., Disp: , Rfl:    metoprolol succinate (TOPROL-XL) 100 MG 24 hr tablet, TAKE 1 TABLET BY MOUTH EVERY DAY, Disp: 90 tablet, Rfl: 3    Multiple Vitamin (MULTIVITAMIN WITH MINERALS) TABS, Take 1 tablet by mouth daily., Disp: , Rfl:    ondansetron (ZOFRAN) 4 MG tablet, Take 1 tablet (4 mg total) by mouth every 8 (eight) hours as needed for refractory nausea / vomiting, vomiting or nausea., Disp: 20 tablet, Rfl: 1   pantoprazole (PROTONIX) 40 MG tablet, Take 1 tablet (40 mg total) by mouth daily. Take 30 minutes prior to a meal, Disp: 30 tablet, Rfl: 5   spironolactone (ALDACTONE) 25 MG tablet, TAKE 1 TABLET BY MOUTH EVERY DAY (Patient taking differently: Take 25 mg by mouth every evening.), Disp: 90 tablet, Rfl: 3   Study - ORION 4 - inclisiran 300 mg/1.75mL or placebo  SQ injection (PI-Stuckey), Inject 300 mg into the skin every 6 (six) months., Disp: , Rfl:    temazepam (RESTORIL) 30 MG capsule, Take 1 capsule (30 mg total) by mouth daily. (Patient taking differently: Take 30 mg by mouth at bedtime.), Disp: 30 capsule, Rfl: 0   glucose blood test strip, Use as instructed to test blood sugars up to 4 times daily, Disp: 200 each, Rfl: 12   nitroGLYCERIN (NITROSTAT) 0.4 MG SL tablet, Place 1 tablet (0.4 mg total) under the tongue every 5 (five) minutes as needed for chest pain. (Patient not taking: Reported on 08/30/2022), Disp: 25 tablet, Rfl: 3  Current Facility-Administered Medications:    Study - ORION 4 - inclisiran 300 mg/1.60mL or placebo SQ injection (PI-Stuckey), 300 mg, Subcutaneous, Q6 months, Stuckey, Arturo Morton, MD, 300 mg at 06/22/22 1015  Allergies  Allergen Reactions   Amlodipine Other (See Comments)    hallucinations    Banana Nausea And Vomiting    Stomach pumped   Co Q10 [Coenzyme Q10] Other (See Comments)    Body cramps   Codeine Other (See Comments)    Hallucinate, loose identity and don't know who I am   Insulin Aspart Other (See Comments)    Unknown   Lisinopril Other (See Comments)    nose bleed   Metformin And Related Nausea And Vomiting   Morphine Other (See Comments)    Can not function, it immobilizes  me    Pentazocine Nausea And Vomiting   Pravastatin Other (See Comments)    Hands locked up   Repatha [Evolocumab] Other (See Comments)    myalgias   Statins Other (See Comments)    Muscle cramps   Sulfa Antibiotics Swelling   Sulfonamide Derivatives Swelling   Tramadol Hcl Other (See Comments)    Dizziness, cant function     Objective:   BP 138/76   Pulse 82   Temp 98.2 F (36.8 C)   Resp 20   Ht 5\' 2"  (1.575 m)   Wt 163 lb (73.9 kg)   LMP  (LMP Unknown)   BMI 29.81 kg/m    Physical Exam Vitals reviewed.  Constitutional:      General: She is not in acute distress.    Appearance: Normal appearance. She is not ill-appearing, toxic-appearing or diaphoretic.  HENT:     Head: Normocephalic and atraumatic.  Eyes:     General: No scleral icterus.       Right eye: No discharge.        Left eye: No discharge.     Conjunctiva/sclera: Conjunctivae normal.  Cardiovascular:     Rate and Rhythm: Normal rate and regular rhythm.     Heart sounds: Normal heart sounds. No murmur heard.    No friction rub. No gallop.  Pulmonary:     Effort: Pulmonary effort is normal. No respiratory distress.     Breath sounds: Normal breath sounds. No stridor. No wheezing, rhonchi or rales.  Abdominal:     General: Bowel sounds are normal.     Palpations: Abdomen is soft. There is no hepatomegaly, splenomegaly, mass or pulsatile mass.     Tenderness: There is generalized abdominal tenderness.  Musculoskeletal:        General: Normal range of motion.     Cervical back: Normal range of motion.  Skin:    General: Skin is warm and dry.     Capillary Refill: Capillary refill takes less than 2 seconds.  Neurological:     General: No  focal deficit present.     Mental Status: She is alert and oriented to person, place, and time. Mental status is at baseline.  Psychiatric:        Mood and Affect: Mood normal.        Behavior: Behavior normal.        Thought Content: Thought content normal.         Judgment: Judgment normal.

## 2022-08-30 NOTE — Patient Instructions (Addendum)
[facility] [place] [facility] ENT/Head and Neck Surgery - Medical Okey Dupre [address], [place] [address] [phone]  Benefiber OR Metamucil

## 2022-08-31 LAB — URINE CULTURE

## 2022-09-06 ENCOUNTER — Ambulatory Visit: Payer: Medicare Other | Attending: Physician Assistant

## 2022-09-06 DIAGNOSIS — M19012 Primary osteoarthritis, left shoulder: Secondary | ICD-10-CM

## 2022-09-06 DIAGNOSIS — M6281 Muscle weakness (generalized): Secondary | ICD-10-CM | POA: Diagnosis present

## 2022-09-06 NOTE — Therapy (Signed)
OUTPATIENT PHYSICAL THERAPY SHOULDER EVALUATION   Patient Name: [name] MRN: [id number] DOB:September 24, 1940, 82 y.o., female Today's Date: 09/06/2022  END OF SESSION:  PT End of Session - 09/06/22 1716     Visit Number 1    Number of Visits 8    Date for PT Re-Evaluation 11/01/22    Authorization Type UHC MCR    Activity Tolerance Patient tolerated treatment well    Behavior During Therapy [facility] for tasks assessed/performed             Past Medical History:  Diagnosis Date   Adenomatous colon polyp    Allergy    Anxiety    Arthritis    Asthma    Carotid artery disease (HCC)    carotid US 02/2017: bilat ICA 1-39%   Chronic diastolic CHF    Echo 02/2017: EF 65-70, Gr 2 DD, mild MS (mean 5), PASP 44   COPD    pt is unsure if has been officially diagnosed   Coronary artery disease    CABG '09- cathed 12/09, 9/10, 6/11, 3/14 and 12/13/16- medical Rx // cath 12/2016 - 2/3 grafts patent >> med Rx // Myoview 12/17: low risk   Diabetes mellitus    Dyspnea    with exertion   Gastroesophageal reflux disease    Headache    Hiatal hernia    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   Hyperlipidemia    Hypertension    Myocardial infarction (HCC)    PONV (postoperative nausea and vomiting)    Schatzki's ring    Shoulder injury    resolved after shoulder surgery   Sleep apnea    not on cpap   Past Surgical History:  Procedure Laterality Date   ABDOMINAL HYSTERECTOMY     APPENDECTOMY     came out with Hysterectomy   CARDIAC CATHETERIZATION  07/23/2009   EF 60%   CARDIAC CATHETERIZATION  10/11/2008   CARDIAC CATHETERIZATION  03/01/2007   EF 75-80%   CARDIOVASCULAR STRESS TEST  11/15/2007   EF 60%   COLONOSCOPY     CORONARY ARTERY BYPASS GRAFT     SEVERELY DISEASED SAPHENOUS VEIN GRAFT TO THE RIGHT CORONARY ARTERY BUT WITH FAIRLY WELL PRESERVED FLOW TO THE DISTAL RIGHT CORONARY ARTERY FROM THE NATIVE CIRCULATION-RESTART  CATH IN JUNE 2000, REVEALS MILD/MODERATE  CAD WITH  GOOD FLOW DOWN HER LAD   ESOPHAGOGASTRODUODENOSCOPY     EYE SURGERY     bilateral cataract surgery with lens implant   LEFT HEART CATH AND CORS/GRAFTS ANGIOGRAPHY N/A 12/14/2016   Procedure: LEFT HEART CATH AND CORS/GRAFTS ANGIOGRAPHY;  Surgeon: Corky Crafts, MD;  Location: [facility];  Service: Cardiovascular;  Laterality: N/A;   lense removal Left    POLYPECTOMY     REVERSE SHOULDER ARTHROPLASTY Left 06/25/2022   Procedure: REVERSE SHOULDER ARTHROPLASTY;  Surgeon: Beverely Low, MD;  Location: [facility];  Service: Orthopedics;  Laterality: Left;  general and choice with interscalene block   RIGHT/LEFT HEART CATH AND CORONARY/GRAFT ANGIOGRAPHY N/A 07/28/2021   Procedure: RIGHT/LEFT HEART CATH AND CORONARY/GRAFT ANGIOGRAPHY;  Surgeon: Lennette Bihari, MD;  Location: [facility];  Service: Cardiovascular;  Laterality: N/A;   ROTATOR CUFF REPAIR     right and left   TONSILLECTOMY     age 35   TOTAL KNEE ARTHROPLASTY Right 02/20/2014   Procedure: RIGHT TOTAL KNEE ARTHROPLASTY;  Surgeon: Jacki Cones, MD;  Location: [facility];  Service: Orthopedics;  Laterality: Right;   tumor removed kidney     UPPER GASTROINTESTINAL ENDOSCOPY     US ECHOCARDIOGRAPHY  03/08/2008   EF 55-60%   Patient Active Problem List   Diagnosis Date Noted   H/O total shoulder replacement, left 06/25/2022   IBS (irritable bowel syndrome) 06/02/2022   Diarrhea 04/07/2022   Hypoglycemia 12/20/2021   Chronic anticoagulation 12/20/2021   Syncope 12/20/2021   Cirrhosis (HCC) 12/20/2021   Left sided numbness 11/09/2021   Rib pain on left side 11/09/2021   Labile blood glucose 09/18/2021   Shortness of breath    Bilateral ankle pain 11/11/2020   Anxiety 09/09/2020   Coronary artery disease    Pelvic pain 10/30/2019   Chest pain at rest 08/24/2018   Arthralgia 08/01/2017   COPD with asthma 05/04/2017   Insomnia 11/23/2016   Chronic nonintractable headache 11/23/2016   Pancreatic insufficiency  10/28/2016   Carotid artery disease (HCC) 05/19/2016   Fatigue 05/19/2016   Anemia 03/27/2016   Type 1 diabetes mellitus (HCC) 07/21/2015   B12 deficiency-monthly B12 injections 03/31/2015   OSA (obstructive sleep apnea) with hypoxia 11/20/2014   Fecal incontinence 08/28/2014   Neurologic gait dysfunction 08/28/2014   Falls 08/28/2014   Neck pain of over 3 months duration 08/28/2014   H/O total knee replacement 02/20/2014   Obesity (BMI 30-39.9) 11/12/2013   Chronic diastolic CHF (congestive heart failure) (HCC) 11/17/2012   Meniscus, lateral, anterior horn derangement 11/10/2011   Medial meniscus, posterior horn derangement 11/10/2011   Osteoarthritis of right knee 11/10/2011   Vertigo 10/05/2011   Essential hypertension 10/05/2011   CAD (coronary artery disease) 08/27/2010   MUSCLE CRAMPS 12/29/2009   History of myocardial infarction 11/19/2009   Extrinsic asthma 11/19/2009   GERD 11/19/2009   Cough 11/19/2009   Hyperlipidemia 11/18/2009    PCP: Pincus Sanes, MD   REFERRING PROVIDER: Colvin Caroli, PA-C  REFERRING DIAG: increase rom s/p ,reverse total arthroplasty  THERAPY DIAG:  Muscle weakness (generalized)  Primary osteoarthritis of left shoulder  Rationale for Evaluation and Treatment: Rehabilitation  ONSET DATE: 06/25/22 for L reverse TSA   SUBJECTIVE:                                                                                                                                                                                      SUBJECTIVE STATEMENT: Underwent L TSA reverse procedure on 06/24/22.  Continues with high levels of pain and limited mobility and function.  Remains in a sling per surgeons orders but may remove in home setting.  Restricted from lifting anything.  Unable to take narcotic pain meds to manage pain levels  Hand dominance:  Right  PERTINENT HISTORY: Underwent L TSA reverse procedure on 06/24/22  PAIN:  Are you having pain? Yes:  NPRS scale: 8/10 Pain location: L shoulder Pain description: ache Aggravating factors: AROM Relieving factors: rest  PRECAUTIONS: Shoulder  RED FLAGS: None   WEIGHT BEARING RESTRICTIONS: No  FALLS:  Has patient fallen in last 6 months? No  OCCUPATION: retired  PLOF: Independent  PATIENT GOALS:To decrease my shoulder pain and use my arm again  NEXT MD VISIT:   OBJECTIVE:   DIAGNOSTIC FINDINGS:  None available  PATIENT SURVEYS:  FOTO 36(57 predicted)   POSTURE: Elevated L shoulder  UPPER EXTREMITY ROM:   A/PROM Right eval Left eval  Shoulder flexion  70/120d  Shoulder extension  30d  Shoulder abduction  70/80d  Shoulder adduction    Shoulder internal rotation    Shoulder external rotation    Elbow flexion    Elbow extension    Wrist flexion    Wrist extension    Wrist ulnar deviation    Wrist radial deviation    Wrist pronation    Wrist supination    (Blank rows = not tested)  UPPER EXTREMITY MMT:  MMT Right eval Left eval  Shoulder flexion  3  Shoulder extension  3+  Shoulder abduction  3  Shoulder adduction    Shoulder internal rotation    Shoulder external rotation    Middle trapezius    Lower trapezius    Elbow flexion  3+  Elbow extension  3+  Wrist flexion    Wrist extension    Wrist ulnar deviation    Wrist radial deviation    Wrist pronation    Wrist supination    Grip strength (lbs)    (Blank rows = not tested)  JOINT MOBILITY TESTING:  Deferred post-op  PALPATION:  Globall tender to subdeltoid region   TODAY'S TREATMENT:                                                                                                                                         DATE: 09/06/22 Eval and HEP   PATIENT EDUCATION: Education details: Discussed eval findings, rehab rationale and POC and patient is in agreement  Person educated: Patient Education method: Explanation Education comprehension: verbalized understanding and needs  further education  HOME EXERCISE PROGRAM: Access Code: [email or web address] URL: [email or web address] Date: 09/06/2022 Prepared by: Gustavus Bryant  Exercises - Supine Shoulder Press with Dowel  - 2 x daily - 5 x weekly - 2 sets - 10 reps - Supine Shoulder Flexion Extension AAROM with Dowel  - 2 x daily - 5 x weekly - 2 sets - 10 reps - Seated Shoulder Shrugs  - 2 x daily - 5 x weekly - 2 sets - 10 reps - Seated Scapular Retraction  - 2 x daily - 5 x weekly - 2 sets - 10 reps  ASSESSMENT:  CLINICAL IMPRESSION:  Patient is a 82 y.o. female  who was seen today for physical therapy evaluation and treatment for post-op treatment following L R TSA.   Patient presents with ROM restrictions both active and passive as well as strength deficits in L deltoid and periscapular musculature.  Substitution patterns identified during AROM.    OBJECTIVE IMPAIRMENTS: decreased activity tolerance, decreased knowledge of condition, decreased mobility, decreased ROM, decreased strength, impaired UE functional use, postural dysfunction, and pain.   ACTIVITY LIMITATIONS: carrying, lifting, bathing, dressing, reach over head, and hygiene/grooming  PERSONAL FACTORS: Age, Past/current experiences, and Time since onset of injury/illness/exacerbation are also affecting patient's functional outcome.   REHAB POTENTIAL: Good  CLINICAL DECISION MAKING: Evolving/moderate complexity  EVALUATION COMPLEXITY: Low   GOALS: Goals reviewed with patient? Yes  SHORT TERM GOALS: Target date: 10/04/2022    Patient to demonstrate independence in HEP  Baseline: [id number] Goal status: INITIAL  2.  Increase AROM of flexion and abduction to 90d Baseline:  A/PROM Right eval Left eval  Shoulder flexion  70/120d  Shoulder extension  30d  Shoulder abduction  70/80d   Goal status: INITIAL  3.  Decrease worst pain to 6/10 Baseline: 8/10 Goal status: INITIAL    LONG TERM GOALS: Target date: 11/01/2022     Increase AROM in flexion and abduction to 135d Baseline:  A/PROM Right eval Left eval  Shoulder flexion  70/120d  Shoulder extension  30d  Shoulder abduction  70/80d   Goal status: INITIAL  2.  Increase strength of L shoulder to 4/5 Baseline:  MMT Right eval Left eval  Shoulder flexion  3  Shoulder extension  3+  Shoulder abduction  3   Goal status: INITIAL  3.  Increase FOTO score to 57 Baseline: 36 Goal status: INITIAL  4.  Decrease worst pain to 4/10 Baseline: 8/10 Goal status: INITIAL    PLAN:  PT FREQUENCY: 1x/week  PT DURATION: 8 weeks  PLANNED INTERVENTIONS: Therapeutic exercises, Therapeutic activity, Neuromuscular re-education, Balance training, Gait training, Patient/Family education, Self Care, Joint mobilization, DME instructions, Aquatic Therapy, Dry Needling, Electrical stimulation, Cryotherapy, Moist heat, Manual therapy, and Re-evaluation  PLAN FOR NEXT SESSION: HEP review and update, manual techniques as appropriate, aerobic tasks, ROM and flexibility activities, strengthening and PREs, TPDN, gait and balance training as needed     Hildred Laser, PT 09/06/2022, 5:17 PM

## 2022-09-08 ENCOUNTER — Encounter (INDEPENDENT_AMBULATORY_CARE_PROVIDER_SITE_OTHER): Payer: Self-pay

## 2022-09-09 ENCOUNTER — Observation Stay (HOSPITAL_COMMUNITY)
Admission: EM | Admit: 2022-09-09 | Discharge: 2022-09-10 | Disposition: A | Discharge status: Home / Self Care | Payer: Medicare Other | Source: Home / Self Care | Attending: Emergency Medicine | Admitting: Emergency Medicine

## 2022-09-09 ENCOUNTER — Other Ambulatory Visit: Payer: Self-pay

## 2022-09-09 ENCOUNTER — Encounter (HOSPITAL_COMMUNITY): Payer: Self-pay

## 2022-09-09 ENCOUNTER — Observation Stay (HOSPITAL_COMMUNITY): Payer: Medicare Other

## 2022-09-09 ENCOUNTER — Emergency Department (HOSPITAL_COMMUNITY): Payer: Medicare Other

## 2022-09-09 DIAGNOSIS — R4182 Altered mental status, unspecified: Secondary | ICD-10-CM | POA: Diagnosis present

## 2022-09-09 DIAGNOSIS — Z7982 Long term (current) use of aspirin: Secondary | ICD-10-CM | POA: Diagnosis not present

## 2022-09-09 DIAGNOSIS — I779 Disorder of arteries and arterioles, unspecified: Secondary | ICD-10-CM | POA: Diagnosis not present

## 2022-09-09 DIAGNOSIS — Z79899 Other long term (current) drug therapy: Secondary | ICD-10-CM | POA: Diagnosis not present

## 2022-09-09 DIAGNOSIS — E7800 Pure hypercholesterolemia, unspecified: Secondary | ICD-10-CM

## 2022-09-09 DIAGNOSIS — E109 Type 1 diabetes mellitus without complications: Secondary | ICD-10-CM | POA: Diagnosis present

## 2022-09-09 DIAGNOSIS — Z794 Long term (current) use of insulin: Secondary | ICD-10-CM | POA: Diagnosis not present

## 2022-09-09 DIAGNOSIS — J9601 Acute respiratory failure with hypoxia: Principal | ICD-10-CM | POA: Insufficient documentation

## 2022-09-09 DIAGNOSIS — Z951 Presence of aortocoronary bypass graft: Secondary | ICD-10-CM | POA: Diagnosis not present

## 2022-09-09 DIAGNOSIS — I2510 Atherosclerotic heart disease of native coronary artery without angina pectoris: Secondary | ICD-10-CM | POA: Diagnosis not present

## 2022-09-09 DIAGNOSIS — G9340 Encephalopathy, unspecified: Secondary | ICD-10-CM | POA: Diagnosis not present

## 2022-09-09 DIAGNOSIS — J449 Chronic obstructive pulmonary disease, unspecified: Secondary | ICD-10-CM | POA: Insufficient documentation

## 2022-09-09 DIAGNOSIS — R55 Syncope and collapse: Secondary | ICD-10-CM

## 2022-09-09 DIAGNOSIS — Z87891 Personal history of nicotine dependence: Secondary | ICD-10-CM | POA: Insufficient documentation

## 2022-09-09 DIAGNOSIS — F419 Anxiety disorder, unspecified: Secondary | ICD-10-CM | POA: Diagnosis not present

## 2022-09-09 DIAGNOSIS — J45909 Unspecified asthma, uncomplicated: Secondary | ICD-10-CM | POA: Insufficient documentation

## 2022-09-09 DIAGNOSIS — I5032 Chronic diastolic (congestive) heart failure: Secondary | ICD-10-CM | POA: Diagnosis not present

## 2022-09-09 DIAGNOSIS — E119 Type 2 diabetes mellitus without complications: Secondary | ICD-10-CM | POA: Diagnosis not present

## 2022-09-09 DIAGNOSIS — I10 Essential (primary) hypertension: Secondary | ICD-10-CM | POA: Diagnosis present

## 2022-09-09 DIAGNOSIS — G4733 Obstructive sleep apnea (adult) (pediatric): Secondary | ICD-10-CM | POA: Diagnosis present

## 2022-09-09 DIAGNOSIS — Z96612 Presence of left artificial shoulder joint: Secondary | ICD-10-CM | POA: Insufficient documentation

## 2022-09-09 DIAGNOSIS — D649 Anemia, unspecified: Secondary | ICD-10-CM | POA: Diagnosis not present

## 2022-09-09 DIAGNOSIS — Z96651 Presence of right artificial knee joint: Secondary | ICD-10-CM | POA: Insufficient documentation

## 2022-09-09 DIAGNOSIS — Z1152 Encounter for screening for COVID-19: Secondary | ICD-10-CM | POA: Insufficient documentation

## 2022-09-09 DIAGNOSIS — I110 Hypertensive heart disease with heart failure: Secondary | ICD-10-CM | POA: Diagnosis not present

## 2022-09-09 DIAGNOSIS — K8689 Other specified diseases of pancreas: Secondary | ICD-10-CM | POA: Diagnosis present

## 2022-09-09 DIAGNOSIS — E785 Hyperlipidemia, unspecified: Secondary | ICD-10-CM | POA: Diagnosis present

## 2022-09-09 DIAGNOSIS — K7460 Unspecified cirrhosis of liver: Secondary | ICD-10-CM | POA: Diagnosis present

## 2022-09-09 DIAGNOSIS — J189 Pneumonia, unspecified organism: Secondary | ICD-10-CM

## 2022-09-09 DIAGNOSIS — J4489 Other specified chronic obstructive pulmonary disease: Secondary | ICD-10-CM | POA: Diagnosis present

## 2022-09-09 DIAGNOSIS — R0902 Hypoxemia: Secondary | ICD-10-CM | POA: Diagnosis present

## 2022-09-09 DIAGNOSIS — E1059 Type 1 diabetes mellitus with other circulatory complications: Secondary | ICD-10-CM

## 2022-09-09 DIAGNOSIS — E669 Obesity, unspecified: Secondary | ICD-10-CM | POA: Diagnosis present

## 2022-09-09 DIAGNOSIS — K219 Gastro-esophageal reflux disease without esophagitis: Secondary | ICD-10-CM | POA: Diagnosis present

## 2022-09-09 LAB — URINALYSIS, ROUTINE W REFLEX MICROSCOPIC
Bilirubin Urine: NEGATIVE
Glucose, UA: NEGATIVE mg/dL
Hgb urine dipstick: NEGATIVE
Ketones, ur: NEGATIVE mg/dL
Leukocytes,Ua: NEGATIVE
Nitrite: NEGATIVE
Protein, ur: NEGATIVE mg/dL
Specific Gravity, Urine: 1.017 (ref 1.005–1.030)
pH: 5 (ref 5.0–8.0)

## 2022-09-09 LAB — PROCALCITONIN: Procalcitonin: <0.1 ng/mL

## 2022-09-09 LAB — DIFFERENTIAL
Abs Immature Granulocytes: 0.01 10*3/uL (ref 0.00–0.07)
Basophils Absolute: 0.1 10*3/uL (ref 0.0–0.1)
Basophils Relative: 1 %
Eosinophils Absolute: 0.1 10*3/uL (ref 0.0–0.5)
Eosinophils Relative: 2 %
Immature Granulocytes: 0 %
Lymphocytes Relative: 41 %
Lymphs Abs: 2.7 10*3/uL (ref 0.7–4.0)
Monocytes Absolute: 0.4 10*3/uL (ref 0.1–1.0)
Monocytes Relative: 7 %
Neutro Abs: 3.2 10*3/uL (ref 1.7–7.7)
Neutrophils Relative %: 49 %

## 2022-09-09 LAB — COMPREHENSIVE METABOLIC PANEL
ALT: 12 U/L (ref 0–44)
AST: 16 U/L (ref 15–41)
Albumin: 3.9 g/dL (ref 3.5–5.0)
Alkaline Phosphatase: 85 U/L (ref 38–126)
Anion gap: 13 (ref 5–15)
BUN: 22 mg/dL (ref 8–23)
CO2: 25 mmol/L (ref 22–32)
Calcium: 9.3 mg/dL (ref 8.9–10.3)
Chloride: 103 mmol/L (ref 98–111)
Creatinine, Ser: 0.83 mg/dL (ref 0.44–1.00)
GFR, Estimated: >60 mL/min (ref >60)
Glucose, Bld: 181 mg/dL — ABNORMAL HIGH (ref 70–99)
Potassium: 4.2 mmol/L (ref 3.5–5.1)
Sodium: 141 mmol/L (ref 135–145)
Total Bilirubin: 0.4 mg/dL (ref 0.3–1.2)
Total Protein: 7.3 g/dL (ref 6.5–8.1)

## 2022-09-09 LAB — CBC
HCT: 43.3 % (ref 36.0–46.0)
Hemoglobin: 13.1 g/dL (ref 12.0–15.0)
MCH: 26 pg (ref 26.0–34.0)
MCHC: 30.3 g/dL (ref 30.0–36.0)
MCV: 86.1 fL (ref 80.0–100.0)
Platelets: 261 10*3/uL (ref 150–400)
RBC: 5.03 MIL/uL (ref 3.87–5.11)
RDW: 16.1 % — ABNORMAL HIGH (ref 11.5–15.5)
WBC: 6.4 10*3/uL (ref 4.0–10.5)
nRBC: 0 % (ref 0.0–0.2)

## 2022-09-09 LAB — I-STAT CHEM 8, ED
BUN: 26 mg/dL — ABNORMAL HIGH (ref 8–23)
Calcium, Ion: 1.2 mmol/L (ref 1.15–1.40)
Chloride: 103 mmol/L (ref 98–111)
Creatinine, Ser: 0.8 mg/dL (ref 0.44–1.00)
Glucose, Bld: 187 mg/dL — ABNORMAL HIGH (ref 70–99)
HCT: 43 % (ref 36.0–46.0)
Hemoglobin: 14.6 g/dL (ref 12.0–15.0)
Potassium: 4.1 mmol/L (ref 3.5–5.1)
Sodium: 143 mmol/L (ref 135–145)
TCO2: 27 mmol/L (ref 22–32)

## 2022-09-09 LAB — CBG MONITORING, ED
Glucose-Capillary: 163 mg/dL — ABNORMAL HIGH (ref 70–99)
Glucose-Capillary: 150 mg/dL — ABNORMAL HIGH (ref 70–99)

## 2022-09-09 LAB — TROPONIN I (HIGH SENSITIVITY)
Troponin I (High Sensitivity): 4 ng/L (ref <18)
Troponin I (High Sensitivity): 6 ng/L (ref <18)

## 2022-09-09 LAB — SARS CORONAVIRUS 2 BY RT PCR: SARS Coronavirus 2 by RT PCR: NEGATIVE

## 2022-09-09 LAB — LIPASE, BLOOD: Lipase: 23 U/L (ref 11–51)

## 2022-09-09 LAB — AMMONIA: Ammonia: <10 umol/L (ref 9–35)

## 2022-09-09 LAB — TSH: TSH: 0.718 u[IU]/mL (ref 0.350–4.500)

## 2022-09-09 MED ORDER — ALBUTEROL SULFATE (2.5 MG/3ML) 0.083% IN NEBU: 2.5000 mg | INHALATION_SOLUTION | Freq: Four times a day (QID) | RESPIRATORY_TRACT | Status: DC | PRN

## 2022-09-09 MED ORDER — SODIUM CHLORIDE 0.9% FLUSH
3.0000 mL | Freq: Two times a day (BID) | INTRAVENOUS | Status: DC
  Administered 2022-09-09 – 2022-09-10 (×2): 3 mL via INTRAVENOUS

## 2022-09-09 MED ORDER — SODIUM CHLORIDE 0.9 % IV SOLN
1.0000 g | Freq: Once | INTRAVENOUS | Status: AC
  Administered 2022-09-09: 1 g via INTRAVENOUS
  Filled 2022-09-09: qty 10

## 2022-09-09 MED ORDER — ACETAMINOPHEN 325 MG PO TABS
650.0000 mg | ORAL_TABLET | Freq: Four times a day (QID) | ORAL | Status: DC | PRN
  Administered 2022-09-10: 650 mg via ORAL
  Filled 2022-09-09: qty 2

## 2022-09-09 MED ORDER — SODIUM CHLORIDE 0.9 % IV BOLUS
500.0000 mL | Freq: Once | INTRAVENOUS | Status: AC
  Administered 2022-09-09: 500 mL via INTRAVENOUS

## 2022-09-09 MED ORDER — FLUTICASONE FUROATE-VILANTEROL 200-25 MCG/ACT IN AEPB
1.0000 | INHALATION_SPRAY | Freq: Every day | RESPIRATORY_TRACT | Status: DC
  Filled 2022-09-09: qty 28

## 2022-09-09 MED ORDER — TEMAZEPAM 15 MG PO CAPS: 30.0000 mg | ORAL_CAPSULE | Freq: Every day | ORAL | Status: DC

## 2022-09-09 MED ORDER — POLYETHYLENE GLYCOL 3350 17 G PO PACK: 17.0000 g | PACK | Freq: Every day | ORAL | Status: DC | PRN

## 2022-09-09 MED ORDER — INSULIN ASPART 100 UNIT/ML IJ SOLN
0.0000 [IU] | INTRAMUSCULAR | Status: DC
  Administered 2022-09-10: 1 [IU] via SUBCUTANEOUS
  Administered 2022-09-10 (×2): 2 [IU] via SUBCUTANEOUS

## 2022-09-09 MED ORDER — ASPIRIN 81 MG PO TBEC
81.0000 mg | DELAYED_RELEASE_TABLET | Freq: Every day | ORAL | Status: DC
  Administered 2022-09-10: 81 mg via ORAL
  Filled 2022-09-09: qty 1

## 2022-09-09 MED ORDER — PANTOPRAZOLE SODIUM 40 MG PO TBEC
40.0000 mg | DELAYED_RELEASE_TABLET | Freq: Every day | ORAL | Status: DC
  Administered 2022-09-10: 40 mg via ORAL
  Filled 2022-09-09 (×2): qty 1

## 2022-09-09 MED ORDER — SODIUM CHLORIDE 0.9 % IV SOLN
100.0000 mL/h | INTRAVENOUS | Status: DC
  Administered 2022-09-09: 100 mL/h via INTRAVENOUS

## 2022-09-09 MED ORDER — FUROSEMIDE 40 MG PO TABS
40.0000 mg | ORAL_TABLET | ORAL | Status: DC
  Administered 2022-09-10: 40 mg via ORAL
  Filled 2022-09-09: qty 2

## 2022-09-09 MED ORDER — ENOXAPARIN SODIUM 40 MG/0.4ML IJ SOSY
40.0000 mg | PREFILLED_SYRINGE | INTRAMUSCULAR | Status: DC
  Administered 2022-09-09: 40 mg via SUBCUTANEOUS
  Filled 2022-09-09: qty 0.4

## 2022-09-09 MED ORDER — UMECLIDINIUM BROMIDE 62.5 MCG/ACT IN AEPB
1.0000 | INHALATION_SPRAY | Freq: Every day | RESPIRATORY_TRACT | Status: DC
  Filled 2022-09-09: qty 7

## 2022-09-09 MED ORDER — SODIUM CHLORIDE 0.9 % IV SOLN
500.0000 mg | Freq: Once | INTRAVENOUS | Status: AC
  Administered 2022-09-09: 500 mg via INTRAVENOUS
  Filled 2022-09-09: qty 5

## 2022-09-09 MED ORDER — ACETAMINOPHEN 650 MG RE SUPP: 650.0000 mg | Freq: Four times a day (QID) | RECTAL | Status: DC | PRN

## 2022-09-09 NOTE — H&P (Addendum)
History and Physical   [name] UXL:[id number] DOB: 06-Nov-1940 DOA: 09/09/2022  PCP: Pincus Sanes, MD   Patient coming from: Home  Chief Complaint: Altered mental status  HPI: [name] is a 82 y.o. female with medical history significant of diabetes, pancreatic insufficiency, OSA, obesity, gait dysfunction, anxiety, anemia, hypertension, hyperlipidemia, GERD, carotid artery disease, CAD status post CABG, COPD, cirrhosis, CHF presenting with altered mental status.  History obtained with assistance of chart review and family due to patient's episode of encephalopathy.  Patient is unsure why she was here and does not remember the events described.  EMS reported that family states that they found patient confused at around 12:30 PM.  Patient was unresponsive and had vomited in bed.  Mental status did improve while and route with EMS.  Patient currently can give her name and follow some directions but has difficulty communicating still.  Patient denies fevers, chills, chest pain, shortness of breath, abdominal pain, constipation, diarrhea.  ED Course: Vital signs in ED notable for blood pressure in the 130s to 170 systolic, heart rate in the 50s to 70s.  Noted to have new 2 L oxygen requirement to maintain saturations.  Lab workup included CMP with glucose 181.  CBC within normal limits.  Troponin negative with repeat pending.  Lipase normal.  COVID screen pending.  Urinalysis normal.  Urine culture pending.  Chest x-ray with small patchy density at the left lower lobe possibly representing atelectasis versus pneumonia.  CT head without acute abnormality.  Patient received ceftriaxone and azithromycin in the ED.  Also received 500 cc IV fluids and started on a rate of IV fluids.  Review of Systems: As per HPI otherwise all other systems reviewed and are negative.  Past Medical History:  Diagnosis Date   Adenomatous colon polyp    Allergy    Anxiety    Arthritis    Asthma    Carotid  artery disease (HCC)    carotid US 02/2017: bilat ICA 1-39%   Chronic diastolic CHF    Echo 02/2017: EF 65-70, Gr 2 DD, mild MS (mean 5), PASP 44   COPD    pt is unsure if has been officially diagnosed   Coronary artery disease    CABG '09- cathed 12/09, 9/10, 6/11, 3/14 and 12/13/16- medical Rx // cath 12/2016 - 2/3 grafts patent >> med Rx // Myoview 12/17: low risk   Diabetes mellitus    Dyspnea    with exertion   Gastroesophageal reflux disease    Headache    Hiatal hernia    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   Hyperlipidemia    Hypertension    Myocardial infarction (HCC)    PONV (postoperative nausea and vomiting)    Schatzki's ring    Shoulder injury    resolved after shoulder surgery   Sleep apnea    not on cpap    Past Surgical History:  Procedure Laterality Date   ABDOMINAL HYSTERECTOMY     APPENDECTOMY     came out with Hysterectomy   CARDIAC CATHETERIZATION  07/23/2009   EF 60%   CARDIAC CATHETERIZATION  10/11/2008   CARDIAC CATHETERIZATION  03/01/2007   EF 75-80%   CARDIOVASCULAR STRESS TEST  11/15/2007   EF 60%   COLONOSCOPY     CORONARY ARTERY BYPASS GRAFT     SEVERELY DISEASED SAPHENOUS VEIN GRAFT TO THE RIGHT CORONARY ARTERY BUT WITH FAIRLY WELL PRESERVED FLOW TO THE DISTAL RIGHT  CORONARY ARTERY FROM THE NATIVE CIRCULATION-RESTART  CATH IN JUNE 2000, REVEALS MILD/MODERATE  CAD WITH GOOD FLOW DOWN HER LAD   ESOPHAGOGASTRODUODENOSCOPY     EYE SURGERY     bilateral cataract surgery with lens implant   LEFT HEART CATH AND CORS/GRAFTS ANGIOGRAPHY N/A 12/14/2016   Procedure: LEFT HEART CATH AND CORS/GRAFTS ANGIOGRAPHY;  Surgeon: Corky Crafts, MD;  Location: [facility];  Service: Cardiovascular;  Laterality: N/A;   lense removal Left    POLYPECTOMY     REVERSE SHOULDER ARTHROPLASTY Left 06/25/2022   Procedure: REVERSE SHOULDER ARTHROPLASTY;  Surgeon: Beverely Low, MD;  Location: [facility];  Service: Orthopedics;  Laterality: Left;  general and  choice with interscalene block   RIGHT/LEFT HEART CATH AND CORONARY/GRAFT ANGIOGRAPHY N/A 07/28/2021   Procedure: RIGHT/LEFT HEART CATH AND CORONARY/GRAFT ANGIOGRAPHY;  Surgeon: Lennette Bihari, MD;  Location: [facility];  Service: Cardiovascular;  Laterality: N/A;   ROTATOR CUFF REPAIR     right and left   TONSILLECTOMY     age 89   TOTAL KNEE ARTHROPLASTY Right 02/20/2014   Procedure: RIGHT TOTAL KNEE ARTHROPLASTY;  Surgeon: Jacki Cones, MD;  Location: [facility];  Service: Orthopedics;  Laterality: Right;   tumor removed kidney     UPPER GASTROINTESTINAL ENDOSCOPY     US ECHOCARDIOGRAPHY  03/08/2008   EF 55-60%    Social History  reports that she quit smoking about 46 years ago. Her smoking use included cigarettes. She started smoking about 67 years ago. She has a 10.5 pack-year smoking history. She has never used smokeless tobacco. She reports that she does not drink alcohol and does not use drugs.  Allergies  Allergen Reactions   Amlodipine Other (See Comments)    hallucinations    Banana Nausea And Vomiting    Stomach pumped   Co Q10 [Coenzyme Q10] Other (See Comments)    Body cramps   Codeine Other (See Comments)    Hallucinate, loose identity and don't know who I am   Insulin Aspart Other (See Comments)    Unknown   Lisinopril Other (See Comments)    nose bleed   Metformin And Related Nausea And Vomiting   Morphine Other (See Comments)    Can not function, it immobilizes me    Pentazocine Nausea And Vomiting   Pravastatin Other (See Comments)    Hands locked up   Repatha [Evolocumab] Other (See Comments)    myalgias   Statins Other (See Comments)    Muscle cramps   Sulfa Antibiotics Swelling   Sulfonamide Derivatives Swelling   Tramadol Hcl Other (See Comments)    Dizziness, cant function     Family History  Problem Relation Age of Onset   Heart disease Maternal Grandfather    Heart failure Maternal Grandfather    Diabetes Maternal Grandfather     Heart attack Father    Diabetes Mother    Rheum arthritis Sister    Emphysema Paternal Uncle    Esophageal cancer Brother 21       she said he was born with it   Emphysema Paternal Aunt    Healthy Child    Neuropathy Neg Hx    Multiple sclerosis Neg Hx    Colon cancer Neg Hx    Colon polyps Neg Hx    Rectal cancer Neg Hx    Stomach cancer Neg Hx   Reviewed on admission.  Prior to Admission medications   Medication Sig Start Date End  Date Taking? Authorizing Provider  acetaminophen (TYLENOL) 325 MG tablet Take 650 mg by mouth every 6 (six) hours as needed for moderate pain or headache.    [provider]  albuterol (PROVENTIL HFA;VENTOLIN HFA) 108 (90 BASE) MCG/ACT inhaler Inhale 2 puffs into the lungs every 6 (six) hours as needed. For shortness of breath. 03/06/12   Storm Frisk, MD  albuterol (PROVENTIL) (2.5 MG/3ML) 0.083% nebulizer solution USE 1 VIAL IN NEBULIZER 4 TIMES DAILY Patient taking differently: Take 2.5 mg by nebulization every 6 (six) hours as needed for wheezing or shortness of breath. 05/07/20   Chilton Greathouse, MD  aspirin EC 81 MG tablet Take 1 tablet (81 mg total) by mouth daily. 12/10/16   Tereso Newcomer T, PA-C  baclofen (LIORESAL) 20 MG tablet TAKE 2 TABLETS BY MOUTH TWICE A DAY 05/10/22   Burns, Bobette Mo, MD  Bempedoic Acid-Ezetimibe (NEXLIZET) 180-10 MG TABS Take 1 tablet by mouth daily. 08/26/21   Tereso Newcomer T, PA-C  calcium carbonate (TUMS - DOSED IN MG ELEMENTAL CALCIUM) 500 MG chewable tablet Chew 1 tablet by mouth daily as needed for indigestion or heartburn.    [provider]  Cholecalciferol (VITAMIN D) 50 MCG (2000 UT) tablet Take 4,000 Units by mouth daily.    [provider]  Continuous Blood Gluc Receiver (FREESTYLE LIBRE 2 READER) DEVI 1 Device by Does not apply route every 14 (fourteen) days. 05/03/22   Shamleffer, Konrad Dolores, MD  Continuous Blood Gluc Sensor (FREESTYLE LIBRE 2 SENSOR) MISC 1 Device by Does not apply  route every 14 (fourteen) days. 05/03/22   Shamleffer, Konrad Dolores, MD  Cyanocobalamin (VITAMIN B-12 IJ) Inject 1,000 mcg as directed every 30 (thirty) days.    [provider]  diltiazem (CARDIZEM CD) 240 MG 24 hr capsule Take 1 capsule (240 mg total) by mouth daily. 07/17/21   Pincus Sanes, MD  divalproex (DEPAKOTE ER) 250 MG 24 hr tablet TAKE 1 TABLET (250 MG TOTAL) BY MOUTH AT BEDTIME. 07/29/22   Jaffe, Adam R, DO  fluticasone (FLONASE) 50 MCG/ACT nasal spray SPRAY 2 SPRAYS INTO EACH NOSTRIL EVERY DAY Patient taking differently: Place 2 sprays into both nostrils daily as needed for allergies. 03/18/22   Chilton Greathouse, MD  Fluticasone-Umeclidin-Vilant (TRELEGY ELLIPTA) 200-62.5-25 MCG/ACT AEPB Inhale 1 puff into the lungs daily at 6 (six) AM. 05/01/21   Parrett, Virgel Bouquet, NP  furosemide (LASIX) 40 MG tablet Take 1 tablet (40 mg total) by mouth daily. Patient taking differently: Take 40 mg by mouth every other day. 10/05/21 08/30/22  Pincus Sanes, MD  glucose blood test strip Use as instructed to test blood sugars up to 4 times daily 11/04/21   Pincus Sanes, MD  HYDROcodone-acetaminophen (NORCO) 5-325 MG tablet Take 1 tablet by mouth every 6 (six) hours as needed for moderate pain. 06/25/22   Beverely Low, MD  insulin lispro protamine-lispro (HUMALOG 75/25 MIX) (75-25) 100 UNIT/ML SUSP injection Inject 18 Units into the skin 2 (two) times daily with a meal. 10 units at lunch depending on bs reading Patient taking differently: Inject 6 Units into the skin See admin instructions. Inject 6  units under the skin at breakfast and 6 units in the evening  PT does not takes if has not eaten 03/30/22   Shamleffer, Konrad Dolores, MD  Insulin Syringe-Needle U-100 (INSULIN SYRINGE .3CC/29GX1") 29G X 1" 0.3 ML MISC 1 Device by Does not apply route in the morning and at bedtime. 12/18/21   Shamleffer,  Konrad Dolores, MD  isosorbide mononitrate (IMDUR) 60 MG 24 hr tablet Take one tablet ( 60 mg ) twice  daily 8 hours apart. 05/14/20   Tereso Newcomer T, PA-C  meclizine (ANTIVERT) 25 MG tablet Take 25 mg by mouth 2 (two) times daily as needed for dizziness.    [provider]  metoprolol succinate (TOPROL-XL) 100 MG 24 hr tablet TAKE 1 TABLET BY MOUTH EVERY DAY 05/11/22   Nahser, Deloris Ping, MD  Multiple Vitamin (MULTIVITAMIN WITH MINERALS) TABS Take 1 tablet by mouth daily.    [provider]  nitroGLYCERIN (NITROSTAT) 0.4 MG SL tablet Place 1 tablet (0.4 mg total) under the tongue every 5 (five) minutes as needed for chest pain. Patient not taking: Reported on 08/30/2022 07/22/21   Lyn Records, MD  ondansetron (ZOFRAN) 4 MG tablet Take 1 tablet (4 mg total) by mouth every 8 (eight) hours as needed for refractory nausea / vomiting, vomiting or nausea. 06/25/22 06/25/23  Beverely Low, MD  pantoprazole (PROTONIX) 40 MG tablet Take 1 tablet (40 mg total) by mouth daily. Take 30 minutes prior to a meal 06/02/22   Pincus Sanes, MD  spironolactone (ALDACTONE) 25 MG tablet TAKE 1 TABLET BY MOUTH EVERY DAY Patient taking differently: Take 25 mg by mouth every evening. 09/15/21   Nahser, Deloris Ping, MD  Study - ORION 4 - inclisiran 300 mg/1.47mL or placebo SQ injection (PI-Stuckey) Inject 300 mg into the skin every 6 (six) months. 01/17/18   [provider]  temazepam (RESTORIL) 30 MG capsule Take 1 capsule (30 mg total) by mouth daily. Patient taking differently: Take 30 mg by mouth at bedtime. 04/07/22   Pincus Sanes, MD    Physical Exam: Vitals:   09/09/22 1500 09/09/22 1515 09/09/22 1530 09/09/22 1545  BP: 138/75 (!) 153/88 (!) 143/79 (!) 150/60  Pulse: 75 77 76 (!) 54  Resp: 12 18 12 13   Temp:      TempSrc:      SpO2: 100% 99% 99% 99%  Weight:      Height:        Physical Exam Constitutional:      General: She is not in acute distress.    Appearance: Normal appearance.  HENT:     Head: Normocephalic and atraumatic.     Mouth/Throat:     Mouth: Mucous membranes are  moist.     Pharynx: Oropharynx is clear.  Eyes:     Extraocular Movements: Extraocular movements intact.     Pupils: Pupils are equal, round, and reactive to light.  Cardiovascular:     Rate and Rhythm: Normal rate and regular rhythm.     Pulses: Normal pulses.     Heart sounds: Normal heart sounds.  Pulmonary:     Effort: Pulmonary effort is normal. No respiratory distress.     Breath sounds: Normal breath sounds.  Abdominal:     General: Bowel sounds are normal. There is no distension.     Palpations: Abdomen is soft.     Tenderness: There is no abdominal tenderness.  Musculoskeletal:        General: No swelling or deformity.  Skin:    General: Skin is warm and dry.  Neurological:     Comments: Alert, oriented to name.  Appears to have difficulty finding the right words at times, follows simple commands.  Strength and sensation grossly intact.    Labs on Admission: I have personally reviewed following labs and imaging studies  CBC:  Recent Labs  Lab 09/09/22 1345 09/09/22 1411  WBC 6.4  --   NEUTROABS 3.2  --   HGB 13.1 14.6  HCT 43.3 43.0  MCV 86.1  --   PLT 261  --     Basic Metabolic Panel: Recent Labs  Lab 09/09/22 1345 09/09/22 1411  NA 141 143  K 4.2 4.1  CL 103 103  CO2 25  --   GLUCOSE 181* 187*  BUN 22 26*  CREATININE 0.83 0.80  CALCIUM 9.3  --     GFR: Estimated Creatinine Clearance: 50.2 mL/min (by C-G formula based on SCr of 0.8 mg/dL).  Liver Function Tests: Recent Labs  Lab 09/09/22 1345  AST 16  ALT 12  ALKPHOS 85  BILITOT 0.4  PROT 7.3  ALBUMIN 3.9    Urine analysis:    Component Value Date/Time   COLORURINE YELLOW 09/09/2022 1447   APPEARANCEUR CLEAR 09/09/2022 1447   LABSPEC 1.017 09/09/2022 1447   PHURINE 5.0 09/09/2022 1447   GLUCOSEU NEGATIVE 09/09/2022 1447   GLUCOSEU NEGATIVE 09/09/2020 1549   HGBUR NEGATIVE 09/09/2022 1447   BILIRUBINUR NEGATIVE 09/09/2022 1447   BILIRUBINUR neg 08/30/2022 1333   KETONESUR  NEGATIVE 09/09/2022 1447   PROTEINUR NEGATIVE 09/09/2022 1447   UROBILINOGEN negative (A) 08/30/2022 1333   UROBILINOGEN 0.2 09/09/2020 1549   NITRITE NEGATIVE 09/09/2022 1447   LEUKOCYTESUR NEGATIVE 09/09/2022 1447    Radiological Exams on Admission: CT HEAD WO CONTRAST  Result Date: 09/09/2022 CLINICAL DATA:  Stroke suspected.  Unresponsive with vomiting. EXAM: CT HEAD WITHOUT CONTRAST TECHNIQUE: Contiguous axial images were obtained from the base of the skull through the vertex without intravenous contrast. RADIATION DOSE REDUCTION: This exam was performed according to the departmental dose-optimization program which includes automated exposure control, adjustment of the mA and/or kV according to patient size and/or use of iterative reconstruction technique. COMPARISON:  Head CT 12/20/2021. FINDINGS: Brain: No evidence of acute infarction, hemorrhage, hydrocephalus, extra-axial collection or mass lesion/mass effect. Age normal brain volume and white matter appearance. Vascular: No hyperdense vessel or unexpected calcification. Skull: Normal. Negative for fracture or focal lesion. Sinuses/Orbits: No acute finding. IMPRESSION: No acute finding or explanation for symptoms. Electronically Signed   By: Tiburcio Pea M.D.   On: 09/09/2022 14:28   DG Chest Portable 1 View  Result Date: 09/09/2022 CLINICAL DATA:  Altered mental status, vomiting EXAM: PORTABLE CHEST 1 VIEW COMPARISON:  02/15/2022 FINDINGS: There is poor inspiration. Transverse diameter of heart is increased. Left hemidiaphragm is elevated. Small patchy densities are seen in left lower lung field. There is previous cardiac surgery, possibly coronary bypass surgery. Dense calcification is seen in mitral annulus. There is no significant pleural effusion or pneumothorax. There is previous bilateral shoulder arthroplasty. IMPRESSION: Small patchy densities are seen in the left lower lung field which may suggest atelectasis/pneumonia. Part of  this finding may be due to poor inspiration and high position of the left hemidiaphragm. There are no signs of pulmonary edema. There is no pleural effusion or pneumothorax. Electronically Signed   By: Ernie Avena M.D.   On: 09/09/2022 14:17    EKG: Independently reviewed.  Sinus rhythm at 77 bpm.  Right bundle branch block.  Repolarization abnormality.  Similar to previous.  Assessment/Plan Principal Problem:   Acute respiratory failure with hypoxia (HCC) Active Problems:   Hyperlipidemia   GERD   Essential hypertension   Chronic diastolic CHF (congestive heart failure) (HCC)   Obesity (BMI 30-39.9)   OSA (obstructive sleep apnea)  with hypoxia   Type 1 diabetes mellitus (HCC)   Anemia   Carotid artery disease (HCC)   Pancreatic insufficiency   COPD with asthma   Coronary artery disease   Anxiety   Cirrhosis (HCC)   Acute respiratory failure with hypoxia Pneumonia versus aspiration > Presenting with altered mental status as below.  Found to have new oxygen requirement of 2 L. > Was found lethargic in bed with vomit .  Possible aspiration event?.  Chest x-ray with small patchy density at the left lower lobe which could represent pneumonia versus atelectasis.  No leukocytosis.  COVID screen is pending. > Concern for pneumonia likely viral versus aspiration event as possible etiology for acute respiratory failure. - Monitor on telemetry - Continue supplemental oxygen, wean as tolerated - Hold off on further antibiotics - Procalcitonin - Trend fever curve and WBC - Follow-up COVID screen, consider additional viral testing if negative.  Acute Encephalopathy > Patient had episode of encephalopathy earlier today.  Patient found to be nonresponsive and had vomited in bed.  Mental status reportedly improved and route with EMS. > Unclear etiology.  Is on some centrally acting medications however given episode was transient there is also concern for seizure, no history of seizure  but is on Depakote for headaches. > CT head without acute abnormality.  No leukocytosis as above.  No significant electrolyte abnormalities. > With word finding difficulty may need to rule out stroke with MRI if EEG is negative. > History of cirrhosis but this was noted just on ultrasound we will hold off on checking ammonia level considering the episode was fairly transient. - EEG - If EEG negative for seizure, MRI - Hold off on antihypertensives - Check ammonia, TSH ADDENDUM > EEG non=specific encephalopathy - MRI Brain  Diabetes - SSI  Hypertension - Working to confirm antihypertensive regimen - Holding off on antihypertensives as above  CAD > Status post CABG - Continue home ASA - Working to confirm antihypertensive regimen and holding off for now either way  Hyperlipidemia Carotid artery disease - Continue home ASA  Diastolic CHF > Last echo in 2023 with EF greater than 75%, unable to evaluate diastolic function, normal RV function. - Still working to confirm home medications  GERD - Continue PPI  COPD - Replacement Trelegy with formulary Breo and Incruse - As needed albuterol  Anxiety - Continue Temazepam for anxiety/sleep  OSA - Not on CPAP per chart  Obesity - Noted  DVT prophylaxis: Lovenox Code Status:   Full Family Communication:  Daughter updated by phone, may be coming to visit. Disposition Plan:   Patient is from:  Home  Anticipated DC to:  Home  Anticipated DC date:  1 to 3 days  Anticipated DC barriers: None  Consults called:  None Admission status:  Observation, telemetry  Severity of Illness: The appropriate patient status for this patient is OBSERVATION. Observation status is judged to be reasonable and necessary in order to provide the required intensity of service to ensure the patient's safety. The patient's presenting symptoms, physical exam findings, and initial radiographic and laboratory data in the context of their medical  condition is felt to place them at decreased risk for further clinical deterioration. Furthermore, it is anticipated that the patient will be medically stable for discharge from the hospital within 2 midnights of admission.    Synetta Fail MD [facility]  How to contact the [facility] Attending or Consulting provider 7A - 7P or covering provider during after hours 7P -  7A, for this patient?   Check the care team in [facility] and look for a) attending/consulting [facility] provider listed and b) the [facility] team listed Log into [email or web address] and use [place]'s universal password to access. If you do not have the password, please contact the hospital operator. Locate the [facility] provider you are looking for under [facility] and page to a number that you can be directly reached. If you still have difficulty reaching the provider, please page the [facility] (Director on Call) for the Hospitalists listed on amion for assistance.  09/09/2022, 4:59 PM

## 2022-09-09 NOTE — ED Triage Notes (Signed)
Pt's LKN 2300 last night, found altered by family around 1230 today. Unresponsive in the bed had vomited all over herself. CBG 180 for EMS. GCS improved on the way to hospital. Responsive to painful stimuli

## 2022-09-09 NOTE — Procedures (Signed)
[facility]  Routine EEG Report  Video Performed: Performed  Demographics: Patient Name:   [name], [name]  Date of Birth:   09-13-1940  Identification Number:   MRN - [id number]  Study Times:  Study Start Time:   09/09/2022 17:16:00 Study End Time:   09/09/2022 17:47:00  Duration:   31 minutes  Indication(s): Encephalopathy  Technical Summary:  This EEG was performed utilizing standard International 10-20 System of electrode placement. One channel electrocardiogram was monitored. Data were obtained and interpreted utilizing referential montage recording, with reformatting to longitudinal, transverse bipolar, and referential montages as necessary for interpretation.  State(s):       Awake      Drowsy   Activation Procedures: Hyperventilation: Not performed Photic Stimulation: Not performed   EEG Description:  The background showed an absent of wakeful posterior dominant alpha rhythm. There was a continuous of diffuse delta intermixed with theta activity noted.  Intermittent generalized triphasic waves were seen.  Physiologic sleep stage II sleep spindle and vertex waves were not recorded.  Throughout the record, there was no epileptiform abnormality or lateralizing sign observed.  There was no clinical event or button-pushed event noted.  Intermittent electrode and movement artifacts were noted.  Impression:  This is an abnormal EEG showing moderate diffuse encephalopathy. No lateralizing sign is observed.  Although the findings are non-specific in etiology, they are commonly associated with metabolic encephalopathy.  There is no epileptiform abnormality, electrographic seizures or evidence of status epilepticus present.    Dr Reece Levy      [facility] For Inpatient follow-up with [facility] physician please call [phone]. This is not an outpatient service. Post hospital  discharge, please contact hospital directly.   Please call or reconsult our service if there are any clinical or diagnostic changes.

## 2022-09-09 NOTE — ED Provider Notes (Signed)
  Physical Exam  BP (!) 150/60   Pulse (!) 54   Temp (!) 96.4 F (35.8 C) (Rectal)   Resp 13   Ht 5\' 2"  (1.575 m)   Wt 74 kg   LMP  (LMP Unknown)   SpO2 99%   BMI 29.84 kg/m   Physical Exam  Procedures  Procedures  ED Course / MDM   Clinical Course as of 09/09/22 1625  Thu Sep 09, 2022  1527 CBC normal.  Metabolic panel normal urinalysis negative troponin normal [JK]  1528 Head CT negative. [JK]  1528 Chest x-ray shows patchy densities in the left lower lobe.  Possible atelectasis versus pneumonia [JK]    Clinical Course User Index [JK] Linwood Dibbles, MD   Medical Decision Making Amount and/or Complexity of Data Reviewed Labs: ordered. Radiology: ordered.  Risk Prescription drug management.   Pt comes in with cc of AMS> LKN - 2300. This AM - unresponsive and vomiting, but now answering questions. She is somnolent. Doesn't recall all the details over the past few hours. She is having some resp symptoms -  ? Infiltrate. Antibiotics given. Covid pending. CT is neg.  Will need admission.        Derwood Kaplan, MD 09/09/22 1626

## 2022-09-09 NOTE — ED Notes (Signed)
Pt to MRI

## 2022-09-09 NOTE — Progress Notes (Signed)
EEG complete - results pending 

## 2022-09-09 NOTE — ED Provider Notes (Signed)
[place] EMERGENCY DEPARTMENT AT [facility] Provider Note   CSN: [id number] Arrival date & time: 09/09/22  1321     History  Chief Complaint  Patient presents with   Altered Mental Status    [name] is a 82 y.o. female.   Altered Mental Status    Patient has a history of coronary artery disease, hypertension, diabetes, reflux, hiatal hernia, COPD, myocardial infarction.  Patient states she is not sure why she is here in the emergency room today.  She denies having any trouble with headaches or chest pain.  She denies any abdominal pain.  She denies any fevers.  She denies any vomiting or diarrhea.  EMS reports patient was found by family members confused today at about 12:30 PM.  Patient was not responding and had vomited in her bed.  Patient's mental status improved during transport.  Home Medications Prior to Admission medications   Medication Sig Start Date End Date Taking? Authorizing Provider  acetaminophen (TYLENOL) 325 MG tablet Take 650 mg by mouth every 6 (six) hours as needed for moderate pain or headache.    [provider]  albuterol (PROVENTIL HFA;VENTOLIN HFA) 108 (90 BASE) MCG/ACT inhaler Inhale 2 puffs into the lungs every 6 (six) hours as needed. For shortness of breath. 03/06/12   Storm Frisk, MD  albuterol (PROVENTIL) (2.5 MG/3ML) 0.083% nebulizer solution USE 1 VIAL IN NEBULIZER 4 TIMES DAILY Patient taking differently: Take 2.5 mg by nebulization every 6 (six) hours as needed for wheezing or shortness of breath. 05/07/20   Chilton Greathouse, MD  aspirin EC 81 MG tablet Take 1 tablet (81 mg total) by mouth daily. 12/10/16   Tereso Newcomer T, PA-C  baclofen (LIORESAL) 20 MG tablet TAKE 2 TABLETS BY MOUTH TWICE A DAY 05/10/22   Burns, Bobette Mo, MD  Bempedoic Acid-Ezetimibe (NEXLIZET) 180-10 MG TABS Take 1 tablet by mouth daily. 08/26/21   Tereso Newcomer T, PA-C  calcium carbonate (TUMS - DOSED IN MG ELEMENTAL CALCIUM) 500 MG chewable tablet  Chew 1 tablet by mouth daily as needed for indigestion or heartburn.    [provider]  Cholecalciferol (VITAMIN D) 50 MCG (2000 UT) tablet Take 4,000 Units by mouth daily.    [provider]  Continuous Blood Gluc Receiver (FREESTYLE LIBRE 2 READER) DEVI 1 Device by Does not apply route every 14 (fourteen) days. 05/03/22   Shamleffer, Konrad Dolores, MD  Continuous Blood Gluc Sensor (FREESTYLE LIBRE 2 SENSOR) MISC 1 Device by Does not apply route every 14 (fourteen) days. 05/03/22   Shamleffer, Konrad Dolores, MD  Cyanocobalamin (VITAMIN B-12 IJ) Inject 1,000 mcg as directed every 30 (thirty) days.    [provider]  diltiazem (CARDIZEM CD) 240 MG 24 hr capsule Take 1 capsule (240 mg total) by mouth daily. 07/17/21   Pincus Sanes, MD  divalproex (DEPAKOTE ER) 250 MG 24 hr tablet TAKE 1 TABLET (250 MG TOTAL) BY MOUTH AT BEDTIME. 07/29/22   Jaffe, Adam R, DO  fluticasone (FLONASE) 50 MCG/ACT nasal spray SPRAY 2 SPRAYS INTO EACH NOSTRIL EVERY DAY Patient taking differently: Place 2 sprays into both nostrils daily as needed for allergies. 03/18/22   Chilton Greathouse, MD  Fluticasone-Umeclidin-Vilant (TRELEGY ELLIPTA) 200-62.5-25 MCG/ACT AEPB Inhale 1 puff into the lungs daily at 6 (six) AM. 05/01/21   Parrett, Virgel Bouquet, NP  furosemide (LASIX) 40 MG tablet Take 1 tablet (40 mg total) by mouth daily. Patient taking differently: Take 40 mg by mouth every  other day. 10/05/21 08/30/22  Pincus Sanes, MD  glucose blood test strip Use as instructed to test blood sugars up to 4 times daily 11/04/21   Pincus Sanes, MD  HYDROcodone-acetaminophen (NORCO) 5-325 MG tablet Take 1 tablet by mouth every 6 (six) hours as needed for moderate pain. 06/25/22   Beverely Low, MD  insulin lispro protamine-lispro (HUMALOG 75/25 MIX) (75-25) 100 UNIT/ML SUSP injection Inject 18 Units into the skin 2 (two) times daily with a meal. 10 units at lunch depending on bs reading Patient taking differently: Inject  6 Units into the skin See admin instructions. Inject 6  units under the skin at breakfast and 6 units in the evening  PT does not takes if has not eaten 03/30/22   Shamleffer, Konrad Dolores, MD  Insulin Syringe-Needle U-100 (INSULIN SYRINGE .3CC/29GX1") 29G X 1" 0.3 ML MISC 1 Device by Does not apply route in the morning and at bedtime. 12/18/21   Shamleffer, Konrad Dolores, MD  isosorbide mononitrate (IMDUR) 60 MG 24 hr tablet Take one tablet ( 60 mg ) twice daily 8 hours apart. 05/14/20   Tereso Newcomer T, PA-C  meclizine (ANTIVERT) 25 MG tablet Take 25 mg by mouth 2 (two) times daily as needed for dizziness.    [provider]  metoprolol succinate (TOPROL-XL) 100 MG 24 hr tablet TAKE 1 TABLET BY MOUTH EVERY DAY 05/11/22   Nahser, Deloris Ping, MD  Multiple Vitamin (MULTIVITAMIN WITH MINERALS) TABS Take 1 tablet by mouth daily.    [provider]  nitroGLYCERIN (NITROSTAT) 0.4 MG SL tablet Place 1 tablet (0.4 mg total) under the tongue every 5 (five) minutes as needed for chest pain. Patient not taking: Reported on 08/30/2022 07/22/21   Lyn Records, MD  ondansetron (ZOFRAN) 4 MG tablet Take 1 tablet (4 mg total) by mouth every 8 (eight) hours as needed for refractory nausea / vomiting, vomiting or nausea. 06/25/22 06/25/23  Beverely Low, MD  pantoprazole (PROTONIX) 40 MG tablet Take 1 tablet (40 mg total) by mouth daily. Take 30 minutes prior to a meal 06/02/22   Pincus Sanes, MD  spironolactone (ALDACTONE) 25 MG tablet TAKE 1 TABLET BY MOUTH EVERY DAY Patient taking differently: Take 25 mg by mouth every evening. 09/15/21   Nahser, Deloris Ping, MD  Study - ORION 4 - inclisiran 300 mg/1.52mL or placebo SQ injection (PI-Stuckey) Inject 300 mg into the skin every 6 (six) months. 01/17/18   [provider]  temazepam (RESTORIL) 30 MG capsule Take 1 capsule (30 mg total) by mouth daily. Patient taking differently: Take 30 mg by mouth at bedtime. 04/07/22   Pincus Sanes, MD       Allergies    Amlodipine, Banana, Co q10 [coenzyme q10], Codeine, Insulin aspart, Lisinopril, Metformin and related, Morphine, Pentazocine, Pravastatin, Repatha [evolocumab], Statins, Sulfa antibiotics, Sulfonamide derivatives, and Tramadol hcl    Review of Systems   Review of Systems  Physical Exam Updated Vital Signs BP (!) 150/60   Pulse (!) 54   Temp (!) 96.4 F (35.8 C) (Rectal)   Resp 13   Ht 1.575 m (5\' 2" )   Wt 74 kg   LMP  (LMP Unknown)   SpO2 99%   BMI 29.84 kg/m  Physical Exam Vitals and nursing note reviewed.  Constitutional:      Appearance: She is well-developed. She is not ill-appearing or diaphoretic.  HENT:     Head: Normocephalic and atraumatic.     Right Ear: External  ear normal.     Left Ear: External ear normal.  Eyes:     General: No scleral icterus.       Right eye: No discharge.        Left eye: No discharge.     Conjunctiva/sclera: Conjunctivae normal.  Neck:     Trachea: No tracheal deviation.  Cardiovascular:     Rate and Rhythm: Normal rate and regular rhythm.  Pulmonary:     Effort: Pulmonary effort is normal. No respiratory distress.     Breath sounds: Normal breath sounds. No stridor. No wheezing or rales.     Comments: Intermittent coughing noted Abdominal:     General: Bowel sounds are normal. There is no distension.     Palpations: Abdomen is soft.     Tenderness: There is no abdominal tenderness. There is no guarding or rebound.  Musculoskeletal:        General: No tenderness or deformity.     Cervical back: Neck supple.  Skin:    General: Skin is warm and dry.     Findings: No rash.  Neurological:     General: No focal deficit present.     Mental Status: She is alert.     Cranial Nerves: No cranial nerve deficit, dysarthria or facial asymmetry.     Sensory: No sensory deficit.     Motor: No weakness, abnormal muscle tone or seizure activity.     Coordination: Coordination normal.     Comments: No pronator drift bilateral  upper extremities, able to lift both legs off the bed for 5 seconds bilateral lower extremities, sensation intact throughout, no facial droop, no dysarthria, no aphasia  Psychiatric:        Mood and Affect: Mood normal.     ED Results / Procedures / Treatments   Labs (all labs ordered are listed, but only abnormal results are displayed) Labs Reviewed  CBC - Abnormal; Notable for the following components:      Result Value   RDW 16.1 (*)    All other components within normal limits  COMPREHENSIVE METABOLIC PANEL - Abnormal; Notable for the following components:   Glucose, Bld 181 (*)    All other components within normal limits  CBG MONITORING, ED - Abnormal; Notable for the following components:   Glucose-Capillary 163 (*)    All other components within normal limits  I-STAT CHEM 8, ED - Abnormal; Notable for the following components:   BUN 26 (*)    Glucose, Bld 187 (*)    All other components within normal limits  URINE CULTURE  SARS CORONAVIRUS 2 BY RT PCR  DIFFERENTIAL  URINALYSIS, ROUTINE W REFLEX MICROSCOPIC  LIPASE, BLOOD  TROPONIN I (HIGH SENSITIVITY)  TROPONIN I (HIGH SENSITIVITY)    EKG EKG Interpretation Date/Time:  Thursday September 09 2022 13:27:00 EDT Ventricular Rate:  77 PR Interval:  189 QRS Duration:  145 QT Interval:  448 QTC Calculation: 508 R Axis:   70  Text Interpretation: Sinus rhythm Right bundle branch block No significant change since last tracing Confirmed by Linwood Dibbles [phone]) on 09/09/2022 1:38:53 PM  Radiology CT HEAD WO CONTRAST  Result Date: 09/09/2022 CLINICAL DATA:  Stroke suspected.  Unresponsive with vomiting. EXAM: CT HEAD WITHOUT CONTRAST TECHNIQUE: Contiguous axial images were obtained from the base of the skull through the vertex without intravenous contrast. RADIATION DOSE REDUCTION: This exam was performed according to the departmental dose-optimization program which includes automated exposure control, adjustment of the mA and/or  kV  according to patient size and/or use of iterative reconstruction technique. COMPARISON:  Head CT 12/20/2021. FINDINGS: Brain: No evidence of acute infarction, hemorrhage, hydrocephalus, extra-axial collection or mass lesion/mass effect. Age normal brain volume and white matter appearance. Vascular: No hyperdense vessel or unexpected calcification. Skull: Normal. Negative for fracture or focal lesion. Sinuses/Orbits: No acute finding. IMPRESSION: No acute finding or explanation for symptoms. Electronically Signed   By: Tiburcio Pea M.D.   On: 09/09/2022 14:28   DG Chest Portable 1 View  Result Date: 09/09/2022 CLINICAL DATA:  Altered mental status, vomiting EXAM: PORTABLE CHEST 1 VIEW COMPARISON:  02/15/2022 FINDINGS: There is poor inspiration. Transverse diameter of heart is increased. Left hemidiaphragm is elevated. Small patchy densities are seen in left lower lung field. There is previous cardiac surgery, possibly coronary bypass surgery. Dense calcification is seen in mitral annulus. There is no significant pleural effusion or pneumothorax. There is previous bilateral shoulder arthroplasty. IMPRESSION: Small patchy densities are seen in the left lower lung field which may suggest atelectasis/pneumonia. Part of this finding may be due to poor inspiration and high position of the left hemidiaphragm. There are no signs of pulmonary edema. There is no pleural effusion or pneumothorax. Electronically Signed   By: Ernie Avena M.D.   On: 09/09/2022 14:17    Procedures Procedures    Medications Ordered in ED Medications  sodium chloride 0.9 % bolus 500 mL (500 mLs Intravenous New Bag/Given 09/09/22 1420)    Followed by  0.9 %  sodium chloride infusion (100 mL/hr Intravenous New Bag/Given 09/09/22 1421)  cefTRIAXone (ROCEPHIN) 1 g in sodium chloride 0.9 % 100 mL IVPB (1 g Intravenous New Bag/Given 09/09/22 1615)  azithromycin (ZITHROMAX) 500 mg in sodium chloride 0.9 % 250 mL IVPB (has no  administration in time range)    ED Course/ Medical Decision Making/ A&P Clinical Course as of 09/09/22 1627  Thu Sep 09, 2022  1527 CBC normal.  Metabolic panel normal urinalysis negative troponin normal [JK]  1528 Head CT negative. [JK]  1528 Chest x-ray shows patchy densities in the left lower lobe.  Possible atelectasis versus pneumonia [JK]    Clinical Course User Index [JK] Linwood Dibbles, MD                                 Medical Decision Making Problems Addressed: Altered mental status, unspecified altered mental status type: acute illness or injury that poses a threat to life or bodily functions Syncope, unspecified syncope type: acute illness or injury that poses a threat to life or bodily functions  Amount and/or Complexity of Data Reviewed Labs: ordered. Radiology: ordered.  Risk Prescription drug management.   Patient presented to the ED for evaluation of altered mental status.  Patient also noted to have an episode of emesis.  In the ED patient has a nonfocal neuroexam.  She is answering questions.  No signs of acute anemia or severe dehydration.  No cardiac dysrhythmia noted.  CT scan does not show any signs of cerebral hemorrhage.  Patient noted to be coughing.  There is question of possible atelectasis versus pneumonia on x-ray.  Patient is on 2l [place] oxygen, new requirement.  Will start on abx.  Consult with medical service for admission        Final Clinical Impression(s) / ED Diagnoses Final diagnoses:  Altered mental status, unspecified altered mental status type  Syncope, unspecified syncope type  Community acquired pneumonia,  unspecified laterality    Rx / DC Orders ED Discharge Orders     None         Linwood Dibbles, MD 09/09/22 1627

## 2022-09-10 DIAGNOSIS — D529 Folate deficiency anemia, unspecified: Secondary | ICD-10-CM

## 2022-09-10 DIAGNOSIS — I5032 Chronic diastolic (congestive) heart failure: Secondary | ICD-10-CM | POA: Diagnosis not present

## 2022-09-10 DIAGNOSIS — F419 Anxiety disorder, unspecified: Secondary | ICD-10-CM | POA: Diagnosis not present

## 2022-09-10 DIAGNOSIS — R0902 Hypoxemia: Secondary | ICD-10-CM

## 2022-09-10 LAB — GLUCOSE, CAPILLARY: Glucose-Capillary: 162 mg/dL — ABNORMAL HIGH (ref 70–99)

## 2022-09-10 LAB — CBG MONITORING, ED
Glucose-Capillary: 123 mg/dL — ABNORMAL HIGH (ref 70–99)
Glucose-Capillary: 151 mg/dL — ABNORMAL HIGH (ref 70–99)
Glucose-Capillary: 167 mg/dL — ABNORMAL HIGH (ref 70–99)

## 2022-09-10 MED ORDER — AZITHROMYCIN 500 MG PO TABS: 500.0000 mg | ORAL_TABLET | Freq: Every day | ORAL | 0 refills | Status: AC

## 2022-09-10 NOTE — ED Notes (Signed)
Patient given applesauce and orange juice

## 2022-09-10 NOTE — Plan of Care (Signed)

## 2022-09-10 NOTE — ED Notes (Signed)
ED TO INPATIENT HANDOFF REPORT  ED Nurse Name and Phone #: [phone]  S Name/Age/Gender [name] 82 y.o. female Room/Bed: 043C/043C  Code Status   Code Status: Full Code  Home/SNF/Other Home Patient oriented to: self, place, and time Is this baseline? Yes   Triage Complete: Triage complete  Chief Complaint Acute respiratory failure with hypoxia (HCC) [J96.01]  Triage Note Pt's LKN 2300 last night, found altered by family around 1230 today. Unresponsive in the bed had vomited all over herself. CBG 180 for EMS. GCS improved on the way to hospital. Responsive to painful stimuli   Allergies Allergies  Allergen Reactions   Amlodipine Other (See Comments)    hallucinations    Banana Nausea And Vomiting    Stomach pumped   Co Q10 [Coenzyme Q10] Other (See Comments)    Body cramps   Codeine Other (See Comments)    Hallucinate, loose identity and don't know who I am   Insulin Aspart Other (See Comments)    Unknown   Lisinopril Other (See Comments)    nose bleed   Metformin And Related Nausea And Vomiting   Morphine Other (See Comments)    Can not function, it immobilizes me    Pentazocine Nausea And Vomiting   Pravastatin Other (See Comments)    Hands locked up   Repatha [Evolocumab] Other (See Comments)    myalgias   Statins Other (See Comments)    Muscle cramps   Sulfa Antibiotics Swelling   Sulfonamide Derivatives Swelling   Tramadol Hcl Other (See Comments)    Dizziness, cant function     Level of Care/Admitting Diagnosis ED Disposition     ED Disposition  Admit   Condition  --   Comment  Hospital Area: [facility] [[id number]]  Level of Care: Telemetry Medical [104]  May place patient in observation at [facility] or [facility] if equivalent level of care is available:: No  Covid Evaluation: Asymptomatic - no recent exposure (last 10 days) testing not required  Diagnosis: Acute respiratory failure with hypoxia [facility]) [[id number]]  Admitting  Physician: Synetta Fail [[id number]]  Attending Physician: Synetta Fail [phone]          B Medical/Surgery History Past Medical History:  Diagnosis Date   Adenomatous colon polyp    Allergy    Anxiety    Arthritis    Asthma    Carotid artery disease (HCC)    carotid US 02/2017: bilat ICA 1-39%   Chronic diastolic CHF    Echo 02/2017: EF 65-70, Gr 2 DD, mild MS (mean 5), PASP 44   COPD    pt is unsure if has been officially diagnosed   Coronary artery disease    CABG '09- cathed 12/09, 9/10, 6/11, 3/14 and 12/13/16- medical Rx // cath 12/2016 - 2/3 grafts patent >> med Rx // Myoview 12/17: low risk   Diabetes mellitus    Dyspnea    with exertion   Gastroesophageal reflux disease    Headache    Hiatal hernia    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   Hyperlipidemia    Hypertension    Myocardial infarction (HCC)    PONV (postoperative nausea and vomiting)    Schatzki's ring    Shoulder injury    resolved after shoulder surgery   Sleep apnea    not on cpap   Past Surgical History:  Procedure Laterality Date   ABDOMINAL HYSTERECTOMY  APPENDECTOMY     came out with Hysterectomy   CARDIAC CATHETERIZATION  07/23/2009   EF 60%   CARDIAC CATHETERIZATION  10/11/2008   CARDIAC CATHETERIZATION  03/01/2007   EF 75-80%   CARDIOVASCULAR STRESS TEST  11/15/2007   EF 60%   COLONOSCOPY     CORONARY ARTERY BYPASS GRAFT     SEVERELY DISEASED SAPHENOUS VEIN GRAFT TO THE RIGHT CORONARY ARTERY BUT WITH FAIRLY WELL PRESERVED FLOW TO THE DISTAL RIGHT CORONARY ARTERY FROM THE NATIVE CIRCULATION-RESTART  CATH IN JUNE 2000, REVEALS MILD/MODERATE  CAD WITH GOOD FLOW DOWN HER LAD   ESOPHAGOGASTRODUODENOSCOPY     EYE SURGERY     bilateral cataract surgery with lens implant   LEFT HEART CATH AND CORS/GRAFTS ANGIOGRAPHY N/A 12/14/2016   Procedure: LEFT HEART CATH AND CORS/GRAFTS ANGIOGRAPHY;  Surgeon: Corky Crafts, MD;  Location: [facility];  Service:  Cardiovascular;  Laterality: N/A;   lense removal Left    POLYPECTOMY     REVERSE SHOULDER ARTHROPLASTY Left 06/25/2022   Procedure: REVERSE SHOULDER ARTHROPLASTY;  Surgeon: Beverely Low, MD;  Location: [facility];  Service: Orthopedics;  Laterality: Left;  general and choice with interscalene block   RIGHT/LEFT HEART CATH AND CORONARY/GRAFT ANGIOGRAPHY N/A 07/28/2021   Procedure: RIGHT/LEFT HEART CATH AND CORONARY/GRAFT ANGIOGRAPHY;  Surgeon: Lennette Bihari, MD;  Location: [facility];  Service: Cardiovascular;  Laterality: N/A;   ROTATOR CUFF REPAIR     right and left   TONSILLECTOMY     age 12   TOTAL KNEE ARTHROPLASTY Right 02/20/2014   Procedure: RIGHT TOTAL KNEE ARTHROPLASTY;  Surgeon: Jacki Cones, MD;  Location: [facility];  Service: Orthopedics;  Laterality: Right;   tumor removed kidney     UPPER GASTROINTESTINAL ENDOSCOPY     US ECHOCARDIOGRAPHY  03/08/2008   EF 55-60%     A IV Location/Drains/Wounds Patient Lines/Drains/Airways Status     Active Line/Drains/Airways     Name Placement date Placement time Site Days   Peripheral IV 09/09/22 20 G Distal;Left;Posterior Forearm 09/09/22  1420  Forearm  1            Intake/Output Last 24 hours No intake or output data in the 24 hours ending 09/10/22 1055  Labs/Imaging Results for orders placed or performed during the hospital encounter of 09/09/22 (from the past 48 hour(s))  CBG monitoring, ED     Status: Abnormal   Collection Time: 09/09/22  1:23 PM  Result Value Ref Range   Glucose-Capillary 163 (H) 70 - 99 mg/dL    Comment: Glucose reference range applies only to samples taken after fasting for at least 8 hours.  CBC     Status: Abnormal   Collection Time: 09/09/22  1:45 PM  Result Value Ref Range   WBC 6.4 4.0 - 10.5 K/uL   RBC 5.03 3.87 - 5.11 MIL/uL   Hemoglobin 13.1 12.0 - 15.0 g/dL   HCT 09.8 11.9 - 14.7 %   MCV 86.1 80.0 - 100.0 fL   MCH 26.0 26.0 - 34.0 pg   MCHC 30.3 30.0 - 36.0 g/dL   RDW 82.9  (H) 56.2 - 15.5 %   Platelets 261 150 - 400 K/uL   nRBC 0.0 0.0 - 0.2 %    Comment: Performed at [facility], [address], [place], [place] [address]  Differential     Status: None   Collection Time: 09/09/22  1:45 PM  Result Value Ref Range  Neutrophils Relative % 49 %   Neutro Abs 3.2 1.7 - 7.7 K/uL   Lymphocytes Relative 41 %   Lymphs Abs 2.7 0.7 - 4.0 K/uL   Monocytes Relative 7 %   Monocytes Absolute 0.4 0.1 - 1.0 K/uL   Eosinophils Relative 2 %   Eosinophils Absolute 0.1 0.0 - 0.5 K/uL   Basophils Relative 1 %   Basophils Absolute 0.1 0.0 - 0.1 K/uL   Immature Granulocytes 0 %   Abs Immature Granulocytes 0.01 0.00 - 0.07 K/uL    Comment: Performed at [facility], [address] [place], [place] [address]  Comprehensive metabolic panel     Status: Abnormal   Collection Time: 09/09/22  1:45 PM  Result Value Ref Range   Sodium 141 135 - 145 mmol/L   Potassium 4.2 3.5 - 5.1 mmol/L   Chloride 103 98 - 111 mmol/L   CO2 25 22 - 32 mmol/L   Glucose, Bld 181 (H) 70 - 99 mg/dL    Comment: Glucose reference range applies only to samples taken after fasting for at least 8 hours.   BUN 22 8 - 23 mg/dL   Creatinine, Ser 0.98 0.44 - 1.00 mg/dL   Calcium 9.3 8.9 - 11.9 mg/dL   Total Protein 7.3 6.5 - 8.1 g/dL   Albumin 3.9 3.5 - 5.0 g/dL   AST 16 15 - 41 U/L   ALT 12 0 - 44 U/L   Alkaline Phosphatase 85 38 - 126 U/L   Total Bilirubin 0.4 0.3 - 1.2 mg/dL   GFR, Estimated >14 >78 mL/min    Comment: (NOTE) Calculated using the CKD-EPI Creatinine Equation (2021)    Anion gap 13 5 - 15    Comment: Performed at [facility], [address], [place], [place] [address]  Troponin I (High Sensitivity)     Status: None   Collection Time: 09/09/22  1:45 PM  Result Value Ref Range   Troponin I (High Sensitivity) 4 <18 ng/L    Comment: (NOTE) Elevated high sensitivity troponin I (hsTnI) values and significant  changes across serial measurements may suggest ACS but  many other  chronic and acute conditions are known to elevate hsTnI results.  Refer to the "Links" section for chest pain algorithms and additional  guidance. Performed at [facility], [address], [place], [place] [address]   Lipase, blood     Status: None   Collection Time: 09/09/22  1:45 PM  Result Value Ref Range   Lipase 23 11 - 51 U/L    Comment: Performed at [facility], [address], [place], [place] [address]  I-stat chem 8, ED     Status: Abnormal   Collection Time: 09/09/22  2:11 PM  Result Value Ref Range   Sodium 143 135 - 145 mmol/L   Potassium 4.1 3.5 - 5.1 mmol/L   Chloride 103 98 - 111 mmol/L   BUN 26 (H) 8 - 23 mg/dL   Creatinine, Ser 9.62 0.44 - 1.00 mg/dL   Glucose, Bld 952 (H) 70 - 99 mg/dL    Comment: Glucose reference range applies only to samples taken after fasting for at least 8 hours.   Calcium, Ion 1.20 1.15 - 1.40 mmol/L   TCO2 27 22 - 32 mmol/L   Hemoglobin 14.6 12.0 - 15.0 g/dL   HCT 84.1 32.4 - 40.1 %  Urinalysis, Routine w reflex microscopic -Urine, Clean Catch     Status: None  Collection Time: 09/09/22  2:47 PM  Result Value Ref Range   Color, Urine YELLOW YELLOW   APPearance CLEAR CLEAR   Specific Gravity, Urine 1.017 1.005 - 1.030   pH 5.0 5.0 - 8.0   Glucose, UA NEGATIVE NEGATIVE mg/dL   Hgb urine dipstick NEGATIVE NEGATIVE   Bilirubin Urine NEGATIVE NEGATIVE   Ketones, ur NEGATIVE NEGATIVE mg/dL   Protein, ur NEGATIVE NEGATIVE mg/dL   Nitrite NEGATIVE NEGATIVE   Leukocytes,Ua NEGATIVE NEGATIVE    Comment: Performed at [facility], [address] [place], [place] [address]  Troponin I (High Sensitivity)     Status: None   Collection Time: 09/09/22  3:44 PM  Result Value Ref Range   Troponin I (High Sensitivity) 6 <18 ng/L    Comment: (NOTE) Elevated high sensitivity troponin I (hsTnI) values and significant  changes across serial measurements may suggest ACS but many other  chronic and acute conditions  are known to elevate hsTnI results.  Refer to the "Links" section for chest pain algorithms and additional  guidance. Performed at [facility], [address], [place], [place] [address]   Procalcitonin     Status: None   Collection Time: 09/09/22  3:44 PM  Result Value Ref Range   Procalcitonin <0.10 ng/mL    Comment:        Interpretation: PCT (Procalcitonin) <= 0.5 ng/mL: Systemic infection (sepsis) is not likely. Local bacterial infection is possible. (NOTE)       Sepsis PCT Algorithm           Lower Respiratory Tract                                      Infection PCT Algorithm    ----------------------------     ----------------------------         PCT < 0.25 ng/mL                PCT < 0.10 ng/mL          Strongly encourage             Strongly discourage   discontinuation of antibiotics    initiation of antibiotics    ----------------------------     -----------------------------       PCT 0.25 - 0.50 ng/mL            PCT 0.10 - 0.25 ng/mL               OR       >80% decrease in PCT            Discourage initiation of                                            antibiotics      Encourage discontinuation           of antibiotics    ----------------------------     -----------------------------         PCT >= 0.50 ng/mL              PCT 0.26 - 0.50 ng/mL               AND        <80% decrease in PCT  Encourage initiation of                                             antibiotics       Encourage continuation           of antibiotics    ----------------------------     -----------------------------        PCT >= 0.50 ng/mL                  PCT > 0.50 ng/mL               AND         increase in PCT                  Strongly encourage                                      initiation of antibiotics    Strongly encourage escalation           of antibiotics                                     -----------------------------                                            PCT <= 0.25 ng/mL                                                 OR                                        > 80% decrease in PCT                                      Discontinue / Do not initiate                                             antibiotics  Performed at [facility], [address], [place], [place] [address]   TSH     Status: None   Collection Time: 09/09/22  3:44 PM  Result Value Ref Range   TSH 0.718 0.350 - 4.500 uIU/mL    Comment: Performed by a 3rd Generation assay with a functional sensitivity of <=0.01 uIU/mL. Performed at [facility], [address], [place], [place] [address]   SARS Coronavirus 2 by RT PCR (hospital order, performed in [facility] hospital lab) *cepheid single result test* Anterior Nasal Swab     Status: None   Collection Time: 09/09/22  5:06 PM   Specimen: Anterior Nasal Swab  Result Value Ref Range  SARS Coronavirus 2 by RT PCR NEGATIVE NEGATIVE    Comment: Performed at [facility], [address] [place], [place] [address]  CBG monitoring, ED     Status: Abnormal   Collection Time: 09/09/22  8:39 PM  Result Value Ref Range   Glucose-Capillary 150 (H) 70 - 99 mg/dL    Comment: Glucose reference range applies only to samples taken after fasting for at least 8 hours.  Ammonia     Status: None   Collection Time: 09/09/22  8:41 PM  Result Value Ref Range   Ammonia <10 9 - 35 umol/L    Comment: Performed at [facility], [address] [place], [place] [address]  CBG monitoring, ED     Status: Abnormal   Collection Time: 09/10/22 12:12 AM  Result Value Ref Range   Glucose-Capillary 167 (H) 70 - 99 mg/dL    Comment: Glucose reference range applies only to samples taken after fasting for at least 8 hours.   Comment 1 Notify RN    Comment 2 Document in Chart   Comprehensive metabolic panel     Status: Abnormal   Collection Time: 09/10/22  3:50 AM  Result Value Ref Range   Sodium 139 135 - 145 mmol/L    Potassium 4.3 3.5 - 5.1 mmol/L   Chloride 103 98 - 111 mmol/L   CO2 26 22 - 32 mmol/L   Glucose, Bld 138 (H) 70 - 99 mg/dL    Comment: Glucose reference range applies only to samples taken after fasting for at least 8 hours.   BUN 13 8 - 23 mg/dL   Creatinine, Ser 5.28 0.44 - 1.00 mg/dL   Calcium 8.8 (L) 8.9 - 10.3 mg/dL   Total Protein 6.1 (L) 6.5 - 8.1 g/dL   Albumin 3.2 (L) 3.5 - 5.0 g/dL   AST 15 15 - 41 U/L   ALT 11 0 - 44 U/L   Alkaline Phosphatase 79 38 - 126 U/L   Total Bilirubin 0.4 0.3 - 1.2 mg/dL   GFR, Estimated >41 >32 mL/min    Comment: (NOTE) Calculated using the CKD-EPI Creatinine Equation (2021)    Anion gap 10 5 - 15    Comment: Performed at [facility], [address], [place], [place] [address]  CBC     Status: Abnormal   Collection Time: 09/10/22  3:50 AM  Result Value Ref Range   WBC 7.2 4.0 - 10.5 K/uL   RBC 4.42 3.87 - 5.11 MIL/uL   Hemoglobin 11.6 (L) 12.0 - 15.0 g/dL   HCT 27.2 53.6 - 64.4 %   MCV 86.7 80.0 - 100.0 fL   MCH 26.2 26.0 - 34.0 pg   MCHC 30.3 30.0 - 36.0 g/dL   RDW 03.4 (H) 74.2 - 59.5 %   Platelets 246 150 - 400 K/uL   nRBC 0.0 0.0 - 0.2 %    Comment: Performed at [facility], [address], [place], [place] [address]  Procalcitonin     Status: None   Collection Time: 09/10/22  3:50 AM  Result Value Ref Range   Procalcitonin <0.10 ng/mL    Comment:        Interpretation: PCT (Procalcitonin) <= 0.5 ng/mL: Systemic infection (sepsis) is not likely. Local bacterial infection is possible. (NOTE)       Sepsis PCT Algorithm           Lower Respiratory Tract  Infection PCT Algorithm    ----------------------------     ----------------------------         PCT < 0.25 ng/mL                PCT < 0.10 ng/mL          Strongly encourage             Strongly discourage   discontinuation of antibiotics    initiation of antibiotics    ----------------------------      -----------------------------       PCT 0.25 - 0.50 ng/mL            PCT 0.10 - 0.25 ng/mL               OR       >80% decrease in PCT            Discourage initiation of                                            antibiotics      Encourage discontinuation           of antibiotics    ----------------------------     -----------------------------         PCT >= 0.50 ng/mL              PCT 0.26 - 0.50 ng/mL               AND        <80% decrease in PCT             Encourage initiation of                                             antibiotics       Encourage continuation           of antibiotics    ----------------------------     -----------------------------        PCT >= 0.50 ng/mL                  PCT > 0.50 ng/mL               AND         increase in PCT                  Strongly encourage                                      initiation of antibiotics    Strongly encourage escalation           of antibiotics                                     -----------------------------                                           PCT <= 0.25 ng/mL  OR                                        > 80% decrease in PCT                                      Discontinue / Do not initiate                                             antibiotics  Performed at [facility], [address] [place], [place] [address]   CBG monitoring, ED     Status: Abnormal   Collection Time: 09/10/22  3:55 AM  Result Value Ref Range   Glucose-Capillary 151 (H) 70 - 99 mg/dL    Comment: Glucose reference range applies only to samples taken after fasting for at least 8 hours.  CBG monitoring, ED     Status: Abnormal   Collection Time: 09/10/22  8:10 AM  Result Value Ref Range   Glucose-Capillary 123 (H) 70 - 99 mg/dL    Comment: Glucose reference range applies only to samples taken after fasting for at least 8 hours.   *Note: Due to a large number of results and/or  encounters for the requested time period, some results have not been displayed. A complete set of results can be found in Results Review.   MR BRAIN WO CONTRAST  Result Date: 09/09/2022 CLINICAL DATA:  Initial evaluation for neuro deficit, stroke suspected. EXAM: MRI HEAD WITHOUT CONTRAST TECHNIQUE: Multiplanar, multiecho pulse sequences of the brain and surrounding structures were obtained without intravenous contrast. COMPARISON:  CT from earlier the same day. FINDINGS: Brain: Examination mildly degraded by motion. Cerebral volume within normal limits. No significant cerebral white matter disease for age. No evidence for acute or subacute ischemia. Gray-white matter differentiation maintained. No areas of chronic cortical infarction. No acute or chronic intracranial blood products. No mass lesion, midline shift or mass effect. No hydrocephalus or extra-axial fluid collection. Empty sella. Vascular: Major intracranial vascular flow voids are maintained. Skull and upper cervical spine: Craniocervical junction within normal limits. Bone marrow signal intensity normal. No scalp soft tissue abnormality. Sinuses/Orbits: Prior bilateral ocular lens replacement. Paranasal sinuses are largely clear. No significant mastoid effusion. Other: None. IMPRESSION: 1. No acute intracranial abnormality. 2. Empty sella. While this finding is often incidental in nature and of no clinical significance, this can also be seen in the setting of idiopathic intracranial hypertension. 3. Otherwise negative brain MRI for age. Electronically Signed   By: Rise Mu M.D.   On: 09/09/2022 20:24   EEG adult  Result Date: 09/09/2022 Reece Levy, MD     09/09/2022  5:56 PM [facility] Routine EEG Report Video Performed: Performed Demographics: Patient Name:   Kayli, Beal Date of Birth:   December 19, 1940 Identification Number:   MRN - [id number] Study Times: Study Start Time:    09/09/2022 17:16:00 Study End Time:   09/09/2022 17:47:00 Duration:   31 minutes Indication(s): Encephalopathy Technical Summary: This EEG was performed utilizing standard International 10-20 System of electrode placement. One channel electrocardiogram was monitored. Data were obtained and interpreted utilizing referential montage recording, with reformatting to longitudinal, transverse  bipolar, and referential montages as necessary for interpretation. State(s):       Awake      Drowsy Activation Procedures: Hyperventilation: Not performed Photic Stimulation: Not performed EEG Description: The background showed an absent of wakeful posterior dominant alpha rhythm. There was a continuous of diffuse delta intermixed with theta activity noted.  Intermittent generalized triphasic waves were seen.  Physiologic sleep stage II sleep spindle and vertex waves were not recorded.  Throughout the record, there was no epileptiform abnormality or lateralizing sign observed.  There was no clinical event or button-pushed event noted.  Intermittent electrode and movement artifacts were noted. Impression: This is an abnormal EEG showing moderate diffuse encephalopathy. No lateralizing sign is observed.  Although the findings are non-specific in etiology, they are commonly associated with metabolic encephalopathy.  There is no epileptiform abnormality, electrographic seizures or evidence of status epilepticus present. Dr Reece Levy [facility] For Inpatient follow-up with [facility] physician please call [facility] [phone]. This is not an outpatient service. Post hospital discharge, please contact hospital directly. Please call or reconsult our service if there are any clinical or diagnostic changes.  CT HEAD WO CONTRAST  Result Date: 09/09/2022 CLINICAL DATA:  Stroke suspected.  Unresponsive with vomiting. EXAM: CT HEAD WITHOUT CONTRAST TECHNIQUE: Contiguous axial images were obtained from the base of the skull  through the vertex without intravenous contrast. RADIATION DOSE REDUCTION: This exam was performed according to the departmental dose-optimization program which includes automated exposure control, adjustment of the mA and/or kV according to patient size and/or use of iterative reconstruction technique. COMPARISON:  Head CT 12/20/2021. FINDINGS: Brain: No evidence of acute infarction, hemorrhage, hydrocephalus, extra-axial collection or mass lesion/mass effect. Age normal brain volume and white matter appearance. Vascular: No hyperdense vessel or unexpected calcification. Skull: Normal. Negative for fracture or focal lesion. Sinuses/Orbits: No acute finding. IMPRESSION: No acute finding or explanation for symptoms. Electronically Signed   By: Tiburcio Pea M.D.   On: 09/09/2022 14:28   DG Chest Portable 1 View  Result Date: 09/09/2022 CLINICAL DATA:  Altered mental status, vomiting EXAM: PORTABLE CHEST 1 VIEW COMPARISON:  02/15/2022 FINDINGS: There is poor inspiration. Transverse diameter of heart is increased. Left hemidiaphragm is elevated. Small patchy densities are seen in left lower lung field. There is previous cardiac surgery, possibly coronary bypass surgery. Dense calcification is seen in mitral annulus. There is no significant pleural effusion or pneumothorax. There is previous bilateral shoulder arthroplasty. IMPRESSION: Small patchy densities are seen in the left lower lung field which may suggest atelectasis/pneumonia. Part of this finding may be due to poor inspiration and high position of the left hemidiaphragm. There are no signs of pulmonary edema. There is no pleural effusion or pneumothorax. Electronically Signed   By: Ernie Avena M.D.   On: 09/09/2022 14:17    Pending Labs Unresulted Labs (From admission, onward)     Start     Ordered   09/16/22 0500  Creatinine, serum  (enoxaparin (LOVENOX)    CrCl >/= 30 ml/min)  Weekly,   R     Comments: while on enoxaparin therapy     09/09/22 1658   09/09/22 1446  Urine Culture  Once,   URGENT       Question:  Indication  Answer:  Altered mental status (if no other cause identified)   09/09/22 1445            Vitals/Pain Today's Vitals   09/10/22 0600 09/10/22 0730 09/10/22 0845 09/10/22 1000  BP: (!) 124/58  Marland Kitchen)  153/85 (!) 144/67  Pulse: 66 65 82 63  Resp: 15 17 (!) 24 19  Temp:   99.2 F (37.3 C)   TempSrc:   Oral   SpO2: 96% 95% 91% 95%  Weight:      Height:      PainSc:        Isolation Precautions Airborne and Contact precautions  Medications Medications  aspirin EC tablet 81 mg (81 mg Oral Given 09/10/22 0902)  furosemide (LASIX) tablet 40 mg (40 mg Oral Given 09/10/22 0902)  pantoprazole (PROTONIX) EC tablet 40 mg (40 mg Oral Given 09/10/22 0902)  albuterol (PROVENTIL) (2.5 MG/3ML) 0.083% nebulizer solution 2.5 mg (has no administration in time range)  fluticasone furoate-vilanterol (BREO ELLIPTA) 200-25 MCG/ACT 1 puff (1 puff Inhalation Not Given 09/10/22 0711)    And  umeclidinium bromide (INCRUSE ELLIPTA) 62.5 MCG/ACT 1 puff (1 puff Inhalation Not Given 09/10/22 0711)  enoxaparin (LOVENOX) injection 40 mg (40 mg Subcutaneous Given 09/09/22 2152)  sodium chloride flush (NS) 0.9 % injection 3 mL (3 mLs Intravenous Given 09/10/22 0908)  acetaminophen (TYLENOL) tablet 650 mg (has no administration in time range)    Or  acetaminophen (TYLENOL) suppository 650 mg (has no administration in time range)  polyethylene glycol (MIRALAX / GLYCOLAX) packet 17 g (has no administration in time range)  insulin aspart (novoLOG) injection 0-9 Units (1 Units Subcutaneous Given 09/10/22 0901)  temazepam (RESTORIL) capsule 30 mg (30 mg Oral Not Given 09/09/22 2152)  sodium chloride 0.9 % bolus 500 mL (0 mLs Intravenous Stopped 09/09/22 1631)  cefTRIAXone (ROCEPHIN) 1 g in sodium chloride 0.9 % 100 mL IVPB (0 g Intravenous Stopped 09/09/22 1631)  azithromycin (ZITHROMAX) 500 mg in sodium chloride 0.9 % 250 mL IVPB (0 mg Intravenous  Stopped 09/09/22 1802)    Mobility walks with device     Focused Assessments Neuro Assessment Handoff:  Swallow screen pass? Yes  Cardiac Rhythm: Normal sinus rhythm   Last date known well: 09/08/22 Last time known well: 2300 Neuro Assessment: Exceptions to WDL Neuro Checks:      Has TPA been given? No If patient is a Neuro Trauma and patient is going to OR before floor call report to 4N Charge nurse: [phone] or [phone]   R Recommendations: See Admitting Provider Note  Report given to:   Additional Notes:

## 2022-09-10 NOTE — ED Notes (Signed)
Pt incontinent of urine. Full bed change and peri care completed.

## 2022-09-10 NOTE — Discharge Summary (Signed)
Physician Discharge Summary  [name] TDD:[id number] DOB: 1940-08-02 DOA: 09/09/2022  PCP: Pincus Sanes, MD  Admit date: 09/09/2022 Discharge date: 09/10/2022  Admitted From: Home  Discharge disposition: Home  Recommendations for Outpatient Follow-Up:   Follow up with your primary care provider in one week.  Check CBC, BMP, magnesium in the next visit Use oxygen as prescribed especially in the night  Discharge Diagnosis:   Principal Problem:   Hypoxia Active Problems:   Hyperlipidemia   GERD   Essential hypertension   Chronic diastolic CHF (congestive heart failure) (HCC)   Obesity (BMI 30-39.9)   OSA (obstructive sleep apnea) with hypoxia   Type 1 diabetes mellitus (HCC)   Anemia   Carotid artery disease (HCC)   Pancreatic insufficiency   COPD with asthma   Coronary artery disease   Anxiety   Cirrhosis (HCC)   Discharge Condition: Improved.  Diet recommendation:Carbohydrate-modified.    Wound care: None.  Code status: Full.   History of Present Illness:   [name] is a 82 y.o. female with medical history significant of diabetes, pancreatic insufficiency, OSA, obesity, gait dysfunction, anxiety, anemia, hypertension, hyperlipidemia, GERD, carotid artery disease, CAD status post CABG, COPD, cirrhosis, CHF presented to the hospital with altered mental status.  EMS reported that she was found to be confused and subsequently unresponsive and had episode of vomiting.  Enroute to the hospital her mental status improved.  In the ED patient required 2 L of oxygen to maintain saturation.  Labs were unremarkable including urinalysis.  Chest x-ray however showed patchy density in the left lower lobe possibly representing atelectasis versus pneumonia.  Patient was then considered for admission to hospital for further evaluation and treatment.    Hospital Course:   Following conditions were addressed during hospitalization as listed below,  Hypoxia,Question  aspiration pneumonitis. Patient was lethargic with vomiting.  Possibility of aspiration.   Procalcitonin was less than 0.10. COVID was negative. Patient was observed overnight.  Stable and improved.  Has mild cough.  Will empirically put the patient on Zithromax on discharge.  At this time patient has qualified for home oxygen on discharge.  She does have history of obstructive sleep apnea with hypoxia in the past and states that she had asthma as well.  Acute toxic encephalopathy Unresponsive lethargic initially.  Uncertain etiology   On Depakote for headaches.  Patient is on Restoril, opiates and baclofen as outpatient.  Likely polypharmacy playing a role.  Encouraged limiting sedatives if possible.  CT head was negative.  EEG showed encephalopathy.  MRI of the brain showed no acute abnormality.  At this time patient is fully oriented at baseline.  Reported history of cirrhosis  Ammonia less than 10.  TSH within normal limits.  LFTs within normal limits.  Diabetes melitis type II On insulin at home.  Diabetic diet.   Hypertension On metoprolol and isosorbide from home.  Will continue.   CAD post CABG Continue aspirin.  Metoprolol, spironolactone, isosorbide will continue.   Hyperlipidemia Carotid artery disease - Continue home ASA metoprolol, spironolactone   Chronic diastolic CHF Compensated.  Last echo in 2023 with EF greater than 75%, unable to evaluate diastolic function, normal RV function.   COPD Continue inhalers from home.  History of sleep apnea with hypoxia..  Encouraged to discuss to use a CPAP at home.    Will provide oxygen on discharge.  Disposition.  At this time, patient is stable for disposition home with outpatient PCP follow-up.  Spoke  with the patient's daughter about disposition home.  Medical Consultants:   None.  Procedures:    EEG Subjective:   Today, patient seen and examined at bedside.  Alert awake and oriented.  Not in obvious distress, no  headache dizziness lightheadedness shortness of breath.  Has mild cough.  Discharge Exam:   Vitals:   09/10/22 1130 09/10/22 1209  BP: 132/79 (!) 151/76  Pulse: 72 74  Resp: (!) 24   Temp:  98.5 F (36.9 C)  SpO2: 94% 97%   Vitals:   09/10/22 1000 09/10/22 1122 09/10/22 1130 09/10/22 1209  BP: (!) 144/67  132/79 (!) 151/76  Pulse: 63  72 74  Resp: 19  (!) 24   Temp:    98.5 F (36.9 C)  TempSrc:    Oral  SpO2: 95% (!) 88% 94% 97%  Weight:      Height:      Body mass index is 29.84 kg/m.   General: Alert awake, not in obvious distress elderly female, HENT: pupils equally reacting to light,  No scleral pallor or icterus noted. Oral mucosa is moist.  Chest:  Diminished breath sounds bilaterally. No crackles or wheezes.  CVS: S1 &S2 heard. No murmur.  Regular rate and rhythm. Abdomen: Soft, nontender, nondistended.  Bowel sounds are heard.   Extremities: No cyanosis, clubbing or edema.  Peripheral pulses are palpable. Psych: Alert, awake and oriented, normal mood CNS:  No cranial nerve deficits.  Power equal in all extremities.   Skin: Warm and dry.  No rashes noted.  The results of significant diagnostics from this hospitalization (including imaging, microbiology, ancillary and laboratory) are listed below for reference.     Diagnostic Studies:   MR BRAIN WO CONTRAST  Result Date: 09/09/2022 CLINICAL DATA:  Initial evaluation for neuro deficit, stroke suspected. EXAM: MRI HEAD WITHOUT CONTRAST TECHNIQUE: Multiplanar, multiecho pulse sequences of the brain and surrounding structures were obtained without intravenous contrast. COMPARISON:  CT from earlier the same day. FINDINGS: Brain: Examination mildly degraded by motion. Cerebral volume within normal limits. No significant cerebral white matter disease for age. No evidence for acute or subacute ischemia. Gray-white matter differentiation maintained. No areas of chronic cortical infarction. No acute or chronic intracranial  blood products. No mass lesion, midline shift or mass effect. No hydrocephalus or extra-axial fluid collection. Empty sella. Vascular: Major intracranial vascular flow voids are maintained. Skull and upper cervical spine: Craniocervical junction within normal limits. Bone marrow signal intensity normal. No scalp soft tissue abnormality. Sinuses/Orbits: Prior bilateral ocular lens replacement. Paranasal sinuses are largely clear. No significant mastoid effusion. Other: None. IMPRESSION: 1. No acute intracranial abnormality. 2. Empty sella. While this finding is often incidental in nature and of no clinical significance, this can also be seen in the setting of idiopathic intracranial hypertension. 3. Otherwise negative brain MRI for age. Electronically Signed   By: Rise Mu M.D.   On: 09/09/2022 20:24   EEG adult  Result Date: 09/09/2022 Reece Levy, MD     09/09/2022  5:56 PM [facility] Routine EEG Report Video Performed: Performed Demographics: Patient Name:   Pegge, Cumberledge Date of Birth:   1940-07-05 Identification Number:   MRN - [id number] Study Times: Study Start Time:   09/09/2022 17:16:00 Study End Time:   09/09/2022 17:47:00 Duration:   31 minutes Indication(s): Encephalopathy Technical Summary: This EEG was performed utilizing standard International 10-20 System of electrode placement. One channel electrocardiogram was monitored. Data were obtained and interpreted utilizing referential  montage recording, with reformatting to longitudinal, transverse bipolar, and referential montages as necessary for interpretation. State(s):       Awake      Drowsy Activation Procedures: Hyperventilation: Not performed Photic Stimulation: Not performed EEG Description: The background showed an absent of wakeful posterior dominant alpha rhythm. There was a continuous of diffuse delta intermixed with theta activity noted.  Intermittent generalized triphasic  waves were seen.  Physiologic sleep stage II sleep spindle and vertex waves were not recorded.  Throughout the record, there was no epileptiform abnormality or lateralizing sign observed.  There was no clinical event or button-pushed event noted.  Intermittent electrode and movement artifacts were noted. Impression: This is an abnormal EEG showing moderate diffuse encephalopathy. No lateralizing sign is observed.  Although the findings are non-specific in etiology, they are commonly associated with metabolic encephalopathy.  There is no epileptiform abnormality, electrographic seizures or evidence of status epilepticus present. Dr Reece Levy [facility] For Inpatient follow-up with [facility] physician please call [facility] [phone]. This is not an outpatient service. Post hospital discharge, please contact hospital directly. Please call or reconsult our service if there are any clinical or diagnostic changes.  CT HEAD WO CONTRAST  Result Date: 09/09/2022 CLINICAL DATA:  Stroke suspected.  Unresponsive with vomiting. EXAM: CT HEAD WITHOUT CONTRAST TECHNIQUE: Contiguous axial images were obtained from the base of the skull through the vertex without intravenous contrast. RADIATION DOSE REDUCTION: This exam was performed according to the departmental dose-optimization program which includes automated exposure control, adjustment of the mA and/or kV according to patient size and/or use of iterative reconstruction technique. COMPARISON:  Head CT 12/20/2021. FINDINGS: Brain: No evidence of acute infarction, hemorrhage, hydrocephalus, extra-axial collection or mass lesion/mass effect. Age normal brain volume and white matter appearance. Vascular: No hyperdense vessel or unexpected calcification. Skull: Normal. Negative for fracture or focal lesion. Sinuses/Orbits: No acute finding. IMPRESSION: No acute finding or explanation for symptoms. Electronically Signed   By: Tiburcio Pea M.D.   On: 09/09/2022  14:28   DG Chest Portable 1 View  Result Date: 09/09/2022 CLINICAL DATA:  Altered mental status, vomiting EXAM: PORTABLE CHEST 1 VIEW COMPARISON:  02/15/2022 FINDINGS: There is poor inspiration. Transverse diameter of heart is increased. Left hemidiaphragm is elevated. Small patchy densities are seen in left lower lung field. There is previous cardiac surgery, possibly coronary bypass surgery. Dense calcification is seen in mitral annulus. There is no significant pleural effusion or pneumothorax. There is previous bilateral shoulder arthroplasty. IMPRESSION: Small patchy densities are seen in the left lower lung field which may suggest atelectasis/pneumonia. Part of this finding may be due to poor inspiration and high position of the left hemidiaphragm. There are no signs of pulmonary edema. There is no pleural effusion or pneumothorax. Electronically Signed   By: Ernie Avena M.D.   On: 09/09/2022 14:17     Labs:   Basic Metabolic Panel: Recent Labs  Lab 09/09/22 1345 09/09/22 1411 09/10/22 0350  NA 141 143 139  K 4.2 4.1 4.3  CL 103 103 103  CO2 25  --  26  GLUCOSE 181* 187* 138*  BUN 22 26* 13  CREATININE 0.83 0.80 0.68  CALCIUM 9.3  --  8.8*   GFR Estimated Creatinine Clearance: 50.2 mL/min (by C-G formula based on SCr of 0.68 mg/dL). Liver Function Tests: Recent Labs  Lab 09/09/22 1345 09/10/22 0350  AST 16 15  ALT 12 11  ALKPHOS 85 79  BILITOT 0.4 0.4  PROT 7.3 6.1*  ALBUMIN 3.9 3.2*   Recent Labs  Lab 09/09/22 1345  LIPASE 23   Recent Labs  Lab 09/09/22 2041  AMMONIA <10   Coagulation profile No results for input(s): "INR", "PROTIME" in the last 168 hours.  CBC: Recent Labs  Lab 09/09/22 1345 09/09/22 1411 09/10/22 0350  WBC 6.4  --  7.2  NEUTROABS 3.2  --   --   HGB 13.1 14.6 11.6*  HCT 43.3 43.0 38.3  MCV 86.1  --  86.7  PLT 261  --  246   Cardiac Enzymes: No results for input(s): "CKTOTAL", "CKMB", "CKMBINDEX", "TROPONINI" in the last  168 hours. BNP: Invalid input(s): "POCBNP" CBG: Recent Labs  Lab 09/09/22 2039 09/10/22 0012 09/10/22 0355 09/10/22 0810 09/10/22 1226  GLUCAP 150* 167* 151* 123* 162*   D-Dimer No results for input(s): "DDIMER" in the last 72 hours. Hgb A1c No results for input(s): "HGBA1C" in the last 72 hours. Lipid Profile No results for input(s): "CHOL", "HDL", "LDLCALC", "TRIG", "CHOLHDL", "LDLDIRECT" in the last 72 hours. Thyroid function studies Recent Labs    09/09/22 1544  TSH 0.718   Anemia work up No results for input(s): "VITAMINB12", "FOLATE", "FERRITIN", "TIBC", "IRON", "RETICCTPCT" in the last 72 hours. Microbiology Recent Results (from the past 240 hour(s))  Urine Culture     Status: None   Collection Time: 09/09/22  2:48 PM   Specimen: In/Out Cath Urine  Result Value Ref Range Status   Specimen Description IN/OUT CATH URINE  Final   Special Requests NONE  Final   Culture   Final    NO GROWTH Performed at [facility], [address] [place], [place] [address]    Report Status 09/10/2022 FINAL  Final  SARS Coronavirus 2 by RT PCR (hospital order, performed in [facility] : [facility] hospital lab) *cepheid single result test* Anterior Nasal Swab     Status: None   Collection Time: 09/09/22  5:06 PM   Specimen: Anterior Nasal Swab  Result Value Ref Range Status   SARS Coronavirus 2 by RT PCR NEGATIVE NEGATIVE Final    Comment: Performed at [facility], [address], [place], [place] [address]     Discharge Instructions:   Discharge Instructions     Diet - low sodium heart healthy   Complete by: As directed    Discharge instructions   Complete by: As directed    Follow-up with your primary care provider in 1 week.  Try to minimize baclofen, Norco, and Restoril if possible.  Keep yourself hydrated.  Seek medical attention for worsening symptoms.  Please discuss about CPAP for your sleep apnea with your primary care provider..   Increase activity slowly    Complete by: As directed       Allergies as of 09/10/2022       Reactions   Amlodipine Other (See Comments)   hallucinations    Banana Nausea And Vomiting   Stomach pumped   Co Q10 [coenzyme Q10] Other (See Comments)   Body cramps   Codeine Other (See Comments)   Hallucinate, loose identity and don't know who I am   Insulin Aspart Other (See Comments)   Unknown   Lisinopril Other (See Comments)   nose bleed   Metformin And Related Nausea And Vomiting   Morphine Other (See Comments)   Can not function, it immobilizes me    Pentazocine Nausea And Vomiting   Pravastatin Other (See Comments)   Hands locked up   Repatha [evolocumab]  Other (See Comments)   myalgias   Statins Other (See Comments)   Muscle cramps   Sulfa Antibiotics Swelling   Sulfonamide Derivatives Swelling   Tramadol Hcl Other (See Comments)   Dizziness, cant function         Medication List     STOP taking these medications    furosemide 40 MG tablet Commonly known as: LASIX   ORION 4 inclisiran or placebo 300 mg/1.5 mL SQ injection   pantoprazole 40 MG tablet Commonly known as: Protonix       TAKE these medications    acetaminophen 325 MG tablet Commonly known as: TYLENOL Take 650 mg by mouth every 6 (six) hours as needed for moderate pain or headache.   albuterol 108 (90 Base) MCG/ACT inhaler Commonly known as: VENTOLIN HFA Inhale 2 puffs into the lungs every 6 (six) hours as needed. For shortness of breath. What changed: Another medication with the same name was changed. Make sure you understand how and when to take each.   albuterol (2.5 MG/3ML) 0.083% nebulizer solution Commonly known as: PROVENTIL USE 1 VIAL IN NEBULIZER 4 TIMES DAILY What changed: See the new instructions.   aspirin EC 81 MG tablet Take 1 tablet (81 mg total) by mouth daily.   azithromycin 500 MG tablet Commonly known as: Zithromax Take 1 tablet (500 mg total) by mouth daily for 3 days.   baclofen 20 MG  tablet Commonly known as: LIORESAL TAKE 2 TABLETS BY MOUTH TWICE A DAY   calcium carbonate 500 MG chewable tablet Commonly known as: TUMS - dosed in mg elemental calcium Chew 1 tablet by mouth daily as needed for indigestion or heartburn.   diltiazem 240 MG 24 hr capsule Commonly known as: CARDIZEM CD Take 1 capsule (240 mg total) by mouth daily.   divalproex 250 MG 24 hr tablet Commonly known as: DEPAKOTE ER TAKE 1 TABLET (250 MG TOTAL) BY MOUTH AT BEDTIME.   fluticasone 50 MCG/ACT nasal spray Commonly known as: FLONASE SPRAY 2 SPRAYS INTO EACH NOSTRIL EVERY DAY What changed: See the new instructions.   FreeStyle Libre 2 Reader Devi 1 Device by Does not apply route every 14 (fourteen) days.   FreeStyle Libre 2 Sensor Misc 1 Device by Does not apply route every 14 (fourteen) days.   glucose blood test strip Use as instructed to test blood sugars up to 4 times daily   HYDROcodone-acetaminophen 5-325 MG tablet Commonly known as: Norco Take 1 tablet by mouth every 6 (six) hours as needed for moderate pain.   insulin lispro protamine-lispro (75-25) 100 UNIT/ML Susp injection Commonly known as: HUMALOG 75/25 MIX Inject 18 Units into the skin 2 (two) times daily with a meal. 10 units at lunch depending on bs reading What changed:  how much to take when to take this additional instructions   INSULIN SYRINGE .3CC/29GX1" 29G X 1" 0.3 ML Misc 1 Device by Does not apply route in the morning and at bedtime.   isosorbide mononitrate 60 MG 24 hr tablet Commonly known as: IMDUR Take one tablet ( 60 mg ) twice daily 8 hours apart.   meclizine 25 MG tablet Commonly known as: ANTIVERT Take 25 mg by mouth 2 (two) times daily as needed for dizziness.   meloxicam 15 MG tablet Commonly known as: MOBIC Take 15 mg by mouth daily.   metoprolol succinate 100 MG 24 hr tablet Commonly known as: TOPROL-XL TAKE 1 TABLET BY MOUTH EVERY DAY   multivitamin with minerals Tabs tablet  Take  1 tablet by mouth daily.   Nexlizet 180-10 MG Tabs Generic drug: Bempedoic Acid-Ezetimibe Take 1 tablet by mouth daily.   nitroGLYCERIN 0.4 MG SL tablet Commonly known as: NITROSTAT Place 1 tablet (0.4 mg total) under the tongue every 5 (five) minutes as needed for chest pain.   ondansetron 4 MG tablet Commonly known as: Zofran Take 1 tablet (4 mg total) by mouth every 8 (eight) hours as needed for refractory nausea / vomiting, vomiting or nausea.   spironolactone 25 MG tablet Commonly known as: ALDACTONE TAKE 1 TABLET BY MOUTH EVERY DAY   temazepam 30 MG capsule Commonly known as: RESTORIL Take 1 capsule (30 mg total) by mouth daily. What changed: when to take this   Trelegy Ellipta 200-62.5-25 MCG/ACT Aepb Generic drug: Fluticasone-Umeclidin-Vilant Inhale 1 puff into the lungs daily at 6 (six) AM.   VITAMIN B-12 IJ Inject 1,000 mcg as directed every 30 (thirty) days.   Vitamin D 50 MCG (2000 UT) tablet Take 4,000 Units by mouth daily.               Durable Medical Equipment  (From admission, onward)           Start     Ordered   09/10/22 1557  For home use only DME oxygen  Once       Question Answer Comment  Length of Need 6 Months   Mode or (Route) Nasal cannula   Liters per Minute 2   Frequency Continuous (stationary and portable oxygen unit needed)   Oxygen conserving device Yes   Oxygen delivery system Gas      09/10/22 1557              Time coordinating discharge: 39 minutes  Signed:     [facility] 09/10/2022, 4:00 PM

## 2022-09-10 NOTE — Progress Notes (Signed)
SATURATION QUALIFICATIONS: (This note is used to comply with regulatory documentation for home oxygen)  Patient Saturations on Room Air at Rest = 93%  Patient Saturations on Room Air while Ambulating = 88%  Patient Saturations on 2 Liters of oxygen while Ambulating = 93%  Please briefly explain why patient needs home oxygen: [place] with ambulation

## 2022-09-10 NOTE — Progress Notes (Signed)
Transition of Care [facility]) - Inpatient Brief Assessment   Patient Details  Name: SHAVONTA GOSSEN MRN: [id number] Date of Birth: 1940/12/16  Transition of Care [facility] CM/SW Contact:    Janae Bridgeman, RN Phone Number: 09/10/2022, 4:18 PM   Clinical Narrative: CM met with the patient at the bedside to discuss TOC needs.  The patient lives at home with her daughter and granddaughter and plans to return home today once she receives home oxygen.  DME at the home includes cane.  Patient was provided with Medicare Observation letter at the bedside.  The patient was provided with Medicare choice regarding home DME company and she did not have a preference.  I called [employer] and asked for home oxygen to be delivered to the patient's hospital room before discharging home.  Bedside nursing will discharge patient home once home oxygen arrives.   Transition of Care Asessment: Insurance and Status: (P) Insurance coverage has been reviewed Patient has primary care physician: (P) Yes Home environment has been reviewed: (P) Yes Prior level of function:: (P) Independent Prior/Current Home Services: (P) No current home services Social Determinants of Health Reivew: (P) SDOH reviewed interventions complete Readmission risk has been reviewed: (P) Yes Transition of care needs: (P) transition of care needs identified, TOC will continue to follow

## 2022-09-10 NOTE — Progress Notes (Signed)
    Durable Medical Equipment  (From admission, onward)           Start     Ordered   09/10/22 1557  For home use only DME oxygen  Once       Question Answer Comment  Length of Need 6 Months   Mode or (Route) Nasal cannula   Liters per Minute 2   Frequency Continuous (stationary and portable oxygen unit needed)   Oxygen conserving device Yes   Oxygen delivery system Gas      09/10/22 1557

## 2022-09-10 NOTE — Care Management Obs Status (Cosign Needed)
MEDICARE OBSERVATION STATUS NOTIFICATION   Patient Details  Name: [name] MRN: [id number] Date of Birth: [birth year]   Medicare Observation Status Notification Given:  Yes    Janae Bridgeman, RN 09/10/2022, 4:03 PM

## 2022-09-13 ENCOUNTER — Telehealth: Payer: Self-pay

## 2022-09-13 NOTE — Transitions of Care (Post Inpatient/ED Visit) (Signed)
09/13/2022  Name: [name] MRN: [id number] DOB: 27-Jul-1940  Today's TOC FU Call Status: Today's TOC FU Call Status:: Successful TOC FU Call Completed TOC FU Call Complete Date: 09/13/22  Transition Care Management Follow-up Telephone Call Date of Discharge: 09/10/22 Discharge Facility: [facility]) Type of Discharge: Inpatient Admission Primary Inpatient Discharge Diagnosis:: altered mental status How have you been since you were released from the hospital?: Better Any questions or concerns?: No  Items Reviewed: Did you receive and understand the discharge instructions provided?: Yes Medications obtained,verified, and reconciled?: Yes (Medications Reviewed) Any new allergies since your discharge?: No Dietary orders reviewed?: Yes Do you have support at home?: Yes People in Home: child(ren), adult  Medications Reviewed Today: Medications Reviewed Today     Reviewed by Karena Addison, LPN (Licensed Practical Nurse) on 09/13/22 at 1527  Med List Status: <None>   Medication Order Taking? Sig Documenting Provider Last Dose Status Informant  acetaminophen (TYLENOL) 325 MG tablet [id number] No Take 650 mg by mouth every 6 (six) hours as needed for moderate pain or headache. [provider] 09/08/2022 Active Self           Med Note Rubin Payor, Jasper Riling Apr 01, 2022  8:18 AM)    albuterol (PROVENTIL HFA;VENTOLIN HFA) 108 (90 BASE) MCG/ACT inhaler [id number] No Inhale 2 puffs into the lungs every 6 (six) hours as needed. For shortness of breath. Storm Frisk, MD Past Week Active Self  albuterol (PROVENTIL) (2.5 MG/3ML) 0.083% nebulizer solution [id number] No USE 1 VIAL IN NEBULIZER 4 TIMES DAILY  Patient taking differently: Take 2.5 mg by nebulization every 6 (six) hours as needed for wheezing or shortness of breath.   Chilton Greathouse, MD unknown Active Self  aspirin EC 81 MG tablet [id number] No Take 1 tablet (81 mg total) by mouth daily. Tereso Newcomer T, PA-C  09/08/2022 Active Self  azithromycin (ZITHROMAX) 500 MG tablet [id number]  Take 1 tablet (500 mg total) by mouth daily for 3 days. Pokhrel, Rebekah Chesterfield, MD  Active   baclofen (LIORESAL) 20 MG tablet [id number] No TAKE 2 TABLETS BY MOUTH TWICE A DAY Pincus Sanes, MD 09/08/2022 Active Self  Bempedoic Acid-Ezetimibe (NEXLIZET) 180-10 MG TABS [id number] No Take 1 tablet by mouth daily.  Patient not taking: Reported on 09/10/2022   Beatrice Lecher, PA-C Not Taking Active Self  calcium carbonate (TUMS - DOSED IN MG ELEMENTAL CALCIUM) 500 MG chewable tablet [id number] No Chew 1 tablet by mouth daily as needed for indigestion or heartburn. [provider] unknown Active Self  Cholecalciferol (VITAMIN D) 50 MCG (2000 UT) tablet [id number] No Take 4,000 Units by mouth daily. [provider] 09/08/2022 Active Self  Continuous Blood Gluc Receiver (FREESTYLE LIBRE 2 READER) DEVI [id number] No 1 Device by Does not apply route every 14 (fourteen) days. Shamleffer, Konrad Dolores, MD Taking Active Self  Continuous Blood Gluc Sensor (FREESTYLE LIBRE 2 SENSOR) MISC [id number] No 1 Device by Does not apply route every 14 (fourteen) days. Shamleffer, Konrad Dolores, MD Taking Active Self  Cyanocobalamin (VITAMIN B-12 IJ) [id number] No Inject 1,000 mcg as directed every 30 (thirty) days. [provider] Past Week Active Self  diltiazem (CARDIZEM CD) 240 MG 24 hr capsule [id number] No Take 1 capsule (240 mg total) by mouth daily.  Patient not taking: Reported on 09/10/2022   Pincus Sanes, MD Not Taking Active Self           Med Note Rubin Payor, [place] R  Mon Apr 05, 2022 10:54 AM) Last dispense record is from Sept 2023  divalproex (DEPAKOTE ER) 250 MG 24 hr tablet [id number] No TAKE 1 TABLET (250 MG TOTAL) BY MOUTH AT BEDTIME.  Patient not taking: Reported on 09/10/2022   Drema Dallas, DO Not Taking Active Self  fluticasone (FLONASE) 50 MCG/ACT nasal spray [id number] No SPRAY 2 SPRAYS INTO EACH NOSTRIL EVERY  DAY  Patient taking differently: Place 2 sprays into both nostrils daily as needed for allergies.   Chilton Greathouse, MD unknown Active Self  Fluticasone-Umeclidin-Vilant (TRELEGY ELLIPTA) 200-62.5-25 MCG/ACT AEPB [id number] No Inhale 1 puff into the lungs daily at 6 (six) AM. Parrett, Virgel Bouquet, NP Past Week Active Self           Med Note Phebe Colla Apr 05, 2022 10:55 AM) Last dispensed March 2023  glucose blood test strip [id number] No Use as instructed to test blood sugars up to 4 times daily Pincus Sanes, MD Taking Active Self  HYDROcodone-acetaminophen (NORCO) 5-325 MG tablet [id number] No Take 1 tablet by mouth every 6 (six) hours as needed for moderate pain. Beverely Low, MD unknown Active Self           Med Note Nedra Hai, Maryann Alar   Fri Sep 10, 2022  9:26 AM)    insulin lispro protamine-lispro (HUMALOG 75/25 MIX) (75-25) 100 UNIT/ML SUSP injection [id number] No Inject 18 Units into the skin 2 (two) times daily with a meal. 10 units at lunch depending on bs reading  Patient taking differently: Inject 6 Units into the skin See admin instructions. Inject 6  units under the skin at breakfast and 6 units in the evening  PT does not takes if has not eaten   Shamleffer, Konrad Dolores, MD Past Week Active Self           Med Note Magdalene River   Mon Aug 30, 2022  1:04 PM)    Insulin Syringe-Needle U-100 (INSULIN SYRINGE .3CC/29GX1") 29G X 1" 0.3 ML MISC [id number] No 1 Device by Does not apply route in the morning and at bedtime. Shamleffer, Konrad Dolores, MD Taking Active Self  isosorbide mononitrate (IMDUR) 60 MG 24 hr tablet [id number] No Take one tablet ( 60 mg ) twice daily 8 hours apart.  Patient not taking: Reported on 09/10/2022   Kennon Rounds Not Taking Active Self           Med Note Darryl Nestle   Tue Oct 27, 2021 11:29 AM)    meclizine (ANTIVERT) 25 MG tablet [id number] No Take 25 mg by mouth 2 (two) times daily as needed for dizziness. [provider] Past Week Active Self           Med Note Nedra Hai, Maryann Alar   Fri Sep 10, 2022  9:27 AM)    meloxicam (MOBIC) 15 MG tablet [id number] No Take 15 mg by mouth daily. [provider] unknown Active Self  metoprolol succinate (TOPROL-XL) 100 MG 24 hr tablet [id number] No TAKE 1 TABLET BY MOUTH EVERY DAY Nahser, Deloris Ping, MD Past Week Active Self  Multiple Vitamin (MULTIVITAMIN WITH MINERALS) TABS [id number] No Take 1 tablet by mouth daily. [provider] Past Week Active Self  nitroGLYCERIN (NITROSTAT) 0.4 MG SL tablet [id number] No Place 1 tablet (0.4 mg total) under the tongue every 5 (five) minutes as needed for chest pain. Lyn Records, MD 09/04/2022 Active Self  Med Note Heath Lark Apr 01, 2022  8:19 AM)    ondansetron (ZOFRAN) 4 MG tablet [id number] No Take 1 tablet (4 mg total) by mouth every 8 (eight) hours as needed for refractory nausea / vomiting, vomiting or nausea. Beverely Low, MD unknown Active Self  spironolactone (ALDACTONE) 25 MG tablet [id number] No TAKE 1 TABLET BY MOUTH EVERY DAY Nahser, Deloris Ping, MD unknown Active Self           Med Note Nedra Hai, Maryann Alar   Fri Sep 10, 2022  9:27 AM)    Study - ORION 4 - inclisiran 300 mg/1.40mL or placebo SQ injection (PI-Stuckey) [id number]   Herby Abraham, MD  Active   temazepam (RESTORIL) 30 MG capsule [id number] No Take 1 capsule (30 mg total) by mouth daily.  Patient taking differently: Take 30 mg by mouth at bedtime.   Pincus Sanes, MD Past Week Active Self            Home Care and Equipment/Supplies: Were Home Health Services Ordered?: NA Any new equipment or medical supplies ordered?: Yes Name of Medical supply agency?: [employer] Were you able to get the equipment/medical supplies?: Yes Do you have any questions related to the use of the equipment/supplies?: No  Functional Questionnaire: Do you need assistance with bathing/showering or dressing?: No Do you need assistance with meal  preparation?: No Do you need assistance with eating?: No Do you have difficulty maintaining continence: No Do you need assistance with getting out of bed/getting out of a chair/moving?: No Do you have difficulty managing or taking your medications?: No  Follow up appointments reviewed: PCP Follow-up appointment confirmed?: Yes Date of PCP follow-up appointment?: 09/21/22 [facility] Follow-up appointment confirmed?: NA Do you need transportation to your follow-up appointment?: No Do you understand care options if your condition(s) worsen?: Yes-patient verbalized understanding    SIGNATURE Karena Addison, LPN [facility] Nurse Health Advisor Direct Dial [phone]

## 2022-09-15 NOTE — Therapy (Deleted)
OUTPATIENT PHYSICAL THERAPY SHOULDER EVALUATION   Patient Name: [name] MRN: [id number] DOB:1941/01/23, 82 y.o., female Today's Date: 09/15/2022  END OF SESSION:    Past Medical History:  Diagnosis Date   Adenomatous colon polyp    Allergy    Anxiety    Arthritis    Asthma    Carotid artery disease (HCC)    carotid US 02/2017: bilat ICA 1-39%   Chronic diastolic CHF    Echo 02/2017: EF 65-70, Gr 2 DD, mild MS (mean 5), PASP 44   COPD    pt is unsure if has been officially diagnosed   Coronary artery disease    CABG '09- cathed 12/09, 9/10, 6/11, 3/14 and 12/13/16- medical Rx // cath 12/2016 - 2/3 grafts patent >> med Rx // Myoview 12/17: low risk   Diabetes mellitus    Dyspnea    with exertion   Gastroesophageal reflux disease    Headache    Hiatal hernia    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   Hyperlipidemia    Hypertension    Myocardial infarction (HCC)    PONV (postoperative nausea and vomiting)    Schatzki's ring    Shoulder injury    resolved after shoulder surgery   Sleep apnea    not on cpap   Past Surgical History:  Procedure Laterality Date   ABDOMINAL HYSTERECTOMY     APPENDECTOMY     came out with Hysterectomy   CARDIAC CATHETERIZATION  07/23/2009   EF 60%   CARDIAC CATHETERIZATION  10/11/2008   CARDIAC CATHETERIZATION  03/01/2007   EF 75-80%   CARDIOVASCULAR STRESS TEST  11/15/2007   EF 60%   COLONOSCOPY     CORONARY ARTERY BYPASS GRAFT     SEVERELY DISEASED SAPHENOUS VEIN GRAFT TO THE RIGHT CORONARY ARTERY BUT WITH FAIRLY WELL PRESERVED FLOW TO THE DISTAL RIGHT CORONARY ARTERY FROM THE NATIVE CIRCULATION-RESTART  CATH IN JUNE 2000, REVEALS MILD/MODERATE  CAD WITH GOOD FLOW DOWN HER LAD   ESOPHAGOGASTRODUODENOSCOPY     EYE SURGERY     bilateral cataract surgery with lens implant   LEFT HEART CATH AND CORS/GRAFTS ANGIOGRAPHY N/A 12/14/2016   Procedure: LEFT HEART CATH AND CORS/GRAFTS ANGIOGRAPHY;  Surgeon: Corky Crafts, MD;   Location: [facility];  Service: Cardiovascular;  Laterality: N/A;   lense removal Left    POLYPECTOMY     REVERSE SHOULDER ARTHROPLASTY Left 06/25/2022   Procedure: REVERSE SHOULDER ARTHROPLASTY;  Surgeon: Beverely Low, MD;  Location: [facility];  Service: Orthopedics;  Laterality: Left;  general and choice with interscalene block   RIGHT/LEFT HEART CATH AND CORONARY/GRAFT ANGIOGRAPHY N/A 07/28/2021   Procedure: RIGHT/LEFT HEART CATH AND CORONARY/GRAFT ANGIOGRAPHY;  Surgeon: Lennette Bihari, MD;  Location: [facility];  Service: Cardiovascular;  Laterality: N/A;   ROTATOR CUFF REPAIR     right and left   TONSILLECTOMY     age 89   TOTAL KNEE ARTHROPLASTY Right 02/20/2014   Procedure: RIGHT TOTAL KNEE ARTHROPLASTY;  Surgeon: Jacki Cones, MD;  Location: [facility];  Service: Orthopedics;  Laterality: Right;   tumor removed kidney     UPPER GASTROINTESTINAL ENDOSCOPY     US ECHOCARDIOGRAPHY  03/08/2008   EF 55-60%   Patient Active Problem List   Diagnosis Date Noted   Hypoxia 09/09/2022   H/O total shoulder replacement, left 06/25/2022   IBS (irritable bowel syndrome) 06/02/2022   Diarrhea 04/07/2022   Hypoglycemia 12/20/2021  Chronic anticoagulation 12/20/2021   Syncope 12/20/2021   Cirrhosis (HCC) 12/20/2021   Left sided numbness 11/09/2021   Rib pain on left side 11/09/2021   Labile blood glucose 09/18/2021   Shortness of breath    Bilateral ankle pain 11/11/2020   Anxiety 09/09/2020   Coronary artery disease    Pelvic pain 10/30/2019   Chest pain at rest 08/24/2018   Arthralgia 08/01/2017   COPD with asthma 05/04/2017   Insomnia 11/23/2016   Chronic nonintractable headache 11/23/2016   Pancreatic insufficiency 10/28/2016   Carotid artery disease (HCC) 05/19/2016   Fatigue 05/19/2016   Anemia 03/27/2016   Type 1 diabetes mellitus (HCC) 07/21/2015   B12 deficiency-monthly B12 injections 03/31/2015   OSA (obstructive sleep apnea) with hypoxia 11/20/2014   Fecal  incontinence 08/28/2014   Neurologic gait dysfunction 08/28/2014   Falls 08/28/2014   Neck pain of over 3 months duration 08/28/2014   H/O total knee replacement 02/20/2014   Obesity (BMI 30-39.9) 11/12/2013   Chronic diastolic CHF (congestive heart failure) (HCC) 11/17/2012   Meniscus, lateral, anterior horn derangement 11/10/2011   Medial meniscus, posterior horn derangement 11/10/2011   Osteoarthritis of right knee 11/10/2011   Vertigo 10/05/2011   Essential hypertension 10/05/2011   MUSCLE CRAMPS 12/29/2009   History of myocardial infarction 11/19/2009   Extrinsic asthma 11/19/2009   GERD 11/19/2009   Cough 11/19/2009   Hyperlipidemia 11/18/2009    PCP: Pincus Sanes, MD   REFERRING PROVIDER: Colvin Caroli, PA-C  REFERRING DIAG: increase rom s/p ,reverse total arthroplasty  THERAPY DIAG:  No diagnosis found.  Rationale for Evaluation and Treatment: Rehabilitation  ONSET DATE: 06/25/22 for L reverse TSA   SUBJECTIVE:                                                                                                                                                                                      SUBJECTIVE STATEMENT: Underwent L TSA reverse procedure on 06/24/22.  Continues with high levels of pain and limited mobility and function.  Remains in a sling per surgeons orders but may remove in home setting.  Restricted from lifting anything.  Unable to take narcotic pain meds to manage pain levels  Hand dominance: Right  PERTINENT HISTORY: Underwent L TSA reverse procedure on 06/24/22  PAIN:  Are you having pain? Yes: NPRS scale: 8/10 Pain location: L shoulder Pain description: ache Aggravating factors: AROM Relieving factors: rest  PRECAUTIONS: Shoulder  RED FLAGS: None   WEIGHT BEARING RESTRICTIONS: No  FALLS:  Has patient fallen in last 6 months? No  OCCUPATION: retired  PLOF: Independent  PATIENT GOALS:To decrease my shoulder pain and use my  arm  again  NEXT MD VISIT:   OBJECTIVE:   DIAGNOSTIC FINDINGS:  None available  PATIENT SURVEYS:  FOTO 36(57 predicted)   POSTURE: Elevated L shoulder  UPPER EXTREMITY ROM:   A/PROM Right eval Left eval  Shoulder flexion  70/120d  Shoulder extension  30d  Shoulder abduction  70/80d  Shoulder adduction    Shoulder internal rotation    Shoulder external rotation    Elbow flexion    Elbow extension    Wrist flexion    Wrist extension    Wrist ulnar deviation    Wrist radial deviation    Wrist pronation    Wrist supination    (Blank rows = not tested)  UPPER EXTREMITY MMT:  MMT Right eval Left eval  Shoulder flexion  3  Shoulder extension  3+  Shoulder abduction  3  Shoulder adduction    Shoulder internal rotation    Shoulder external rotation    Middle trapezius    Lower trapezius    Elbow flexion  3+  Elbow extension  3+  Wrist flexion    Wrist extension    Wrist ulnar deviation    Wrist radial deviation    Wrist pronation    Wrist supination    Grip strength (lbs)    (Blank rows = not tested)  JOINT MOBILITY TESTING:  Deferred post-op  PALPATION:  Globall tender to subdeltoid region   TODAY'S TREATMENT:                                                                                                                                         DATE: 09/06/22 Eval and HEP   PATIENT EDUCATION: Education details: Discussed eval findings, rehab rationale and POC and patient is in agreement  Person educated: Patient Education method: Explanation Education comprehension: verbalized understanding and needs further education  HOME EXERCISE PROGRAM: Access Code: [email or web address] URL: [email or web address] Date: 09/06/2022 Prepared by: Gustavus Bryant  Exercises - Supine Shoulder Press with Dowel  - 2 x daily - 5 x weekly - 2 sets - 10 reps - Supine Shoulder Flexion Extension AAROM with Dowel  - 2 x daily - 5 x weekly - 2 sets - 10 reps - Seated  Shoulder Shrugs  - 2 x daily - 5 x weekly - 2 sets - 10 reps - Seated Scapular Retraction  - 2 x daily - 5 x weekly - 2 sets - 10 reps  ASSESSMENT:  CLINICAL IMPRESSION: Patient is a 82 y.o. female  who was seen today for physical therapy evaluation and treatment for post-op treatment following L R TSA.   Patient presents with ROM restrictions both active and passive as well as strength deficits in L deltoid and periscapular musculature.  Substitution patterns identified during AROM.    OBJECTIVE IMPAIRMENTS: decreased activity tolerance, decreased knowledge of condition, decreased mobility, decreased ROM, decreased strength, impaired UE functional use, postural  dysfunction, and pain.   ACTIVITY LIMITATIONS: carrying, lifting, bathing, dressing, reach over head, and hygiene/grooming  PERSONAL FACTORS: Age, Past/current experiences, and Time since onset of injury/illness/exacerbation are also affecting patient's functional outcome.   REHAB POTENTIAL: Good  CLINICAL DECISION MAKING: Evolving/moderate complexity  EVALUATION COMPLEXITY: Low   GOALS: Goals reviewed with patient? Yes  SHORT TERM GOALS: Target date: 10/04/2022    Patient to demonstrate independence in HEP  Baseline: [id number] Goal status: INITIAL  2.  Increase AROM of flexion and abduction to 90d Baseline:  A/PROM Right eval Left eval  Shoulder flexion  70/120d  Shoulder extension  30d  Shoulder abduction  70/80d   Goal status: INITIAL  3.  Decrease worst pain to 6/10 Baseline: 8/10 Goal status: INITIAL    LONG TERM GOALS: Target date: 11/01/2022    Increase AROM in flexion and abduction to 135d Baseline:  A/PROM Right eval Left eval  Shoulder flexion  70/120d  Shoulder extension  30d  Shoulder abduction  70/80d   Goal status: INITIAL  2.  Increase strength of L shoulder to 4/5 Baseline:  MMT Right eval Left eval  Shoulder flexion  3  Shoulder extension  3+  Shoulder abduction  3   Goal  status: INITIAL  3.  Increase FOTO score to 57 Baseline: 36 Goal status: INITIAL  4.  Decrease worst pain to 4/10 Baseline: 8/10 Goal status: INITIAL    PLAN:  PT FREQUENCY: 1x/week  PT DURATION: 8 weeks  PLANNED INTERVENTIONS: Therapeutic exercises, Therapeutic activity, Neuromuscular re-education, Balance training, Gait training, Patient/Family education, Self Care, Joint mobilization, DME instructions, Aquatic Therapy, Dry Needling, Electrical stimulation, Cryotherapy, Moist heat, Manual therapy, and Re-evaluation  PLAN FOR NEXT SESSION: HEP review and update, manual techniques as appropriate, aerobic tasks, ROM and flexibility activities, strengthening and PREs, TPDN, gait and balance training as needed     Hildred Laser, PT 09/15/2022, 9:21 AM

## 2022-09-16 ENCOUNTER — Ambulatory Visit: Payer: Medicare Other

## 2022-09-20 ENCOUNTER — Ambulatory Visit: Payer: Self-pay | Admitting: Licensed Clinical Social Worker

## 2022-09-20 DIAGNOSIS — R4182 Altered mental status, unspecified: Secondary | ICD-10-CM | POA: Insufficient documentation

## 2022-09-20 NOTE — Progress Notes (Unsigned)
Subjective:    Patient ID: [name], female    DOB: 1940-09-02, 82 y.o.   MRN: [id number]     HPI [name] is here for follow up from the hospital  Went to the hospital 8/2 - observed and then discharged.    Presented with AMS.  History was from family.  She was unsure why she was there and did not remember the events told by her family.  Family found her confused around 12:30pm.  She was unresponsive and had vomited in bed.  Mental status improved en route to ED.    In ED patient was able to state name and follow some directions but had difficulty communicating still.  She was hypoxic and requred 2 L O2 via [place], other VSS.  Ua normal.  Labs unremarkable.  EKG w/o change.Covid neg.  CXR with LLL atelectasis vs pna.  Ct head w/o acute abn.  Received ceftriaxone, azithromycin and IVF.    Hypoxia: New ? Asp PNA given lethargy and vomiting Procalcitononi < 0.10 Covid neg Observed overnight - stable, improved Mild cough Discharged home on zithromax Qualified for home O2  Acute toxic encephalopathy Initially unresponsive, lethargic Uncertain etiology At home on depakote, restoril, opiates and baclofen -- likely polypharmacy Advised limiting sedatives CT neg EEG showed encephalopathy MRI brain no acute abn  Reported h/o cirrhosis Ammonia < 10 Tsh wnl Lfts nml  Diabetes, type 2 On insulin  Hypertension On metoprlol, imdur  CAD s/p CABG, HFpEF ASA, metoprolol, spironolactone, imdur Euvolemic  - EF > 75%  Hyperlipidemia CAD ASA, metoprolol, spironolactone  COPD Inhalers from home  H/o OSA w/ hypoxia Started on oxygen at night Should restart cpap      Medications and allergies reviewed with patient and updated if appropriate.  Current Outpatient Medications on File Prior to Visit  Medication Sig Dispense Refill   acetaminophen (TYLENOL) 325 MG tablet Take 650 mg by mouth every 6 (six) hours as needed for moderate pain or headache.     albuterol  (PROVENTIL HFA;VENTOLIN HFA) 108 (90 BASE) MCG/ACT inhaler Inhale 2 puffs into the lungs every 6 (six) hours as needed. For shortness of breath. 1 Inhaler 0   albuterol (PROVENTIL) (2.5 MG/3ML) 0.083% nebulizer solution USE 1 VIAL IN NEBULIZER 4 TIMES DAILY (Patient taking differently: Take 2.5 mg by nebulization every 6 (six) hours as needed for wheezing or shortness of breath.) 360 mL 1   aspirin EC 81 MG tablet Take 1 tablet (81 mg total) by mouth daily.     baclofen (LIORESAL) 20 MG tablet TAKE 2 TABLETS BY MOUTH TWICE A DAY 360 tablet 2   Bempedoic Acid-Ezetimibe (NEXLIZET) 180-10 MG TABS Take 1 tablet by mouth daily. (Patient not taking: Reported on 09/10/2022) 90 tablet 3   calcium carbonate (TUMS - DOSED IN MG ELEMENTAL CALCIUM) 500 MG chewable tablet Chew 1 tablet by mouth daily as needed for indigestion or heartburn.     Cholecalciferol (VITAMIN D) 50 MCG (2000 UT) tablet Take 4,000 Units by mouth daily.     Continuous Blood Gluc Receiver (FREESTYLE LIBRE 2 READER) DEVI 1 Device by Does not apply route every 14 (fourteen) days. 1 each 3   Continuous Blood Gluc Sensor (FREESTYLE LIBRE 2 SENSOR) MISC 1 Device by Does not apply route every 14 (fourteen) days. 6 each 3   Cyanocobalamin (VITAMIN B-12 IJ) Inject 1,000 mcg as directed every 30 (thirty) days.     diltiazem (CARDIZEM CD) 240 MG 24 hr capsule  Take 1 capsule (240 mg total) by mouth daily. (Patient not taking: Reported on 09/10/2022) 90 capsule 2   divalproex (DEPAKOTE ER) 250 MG 24 hr tablet TAKE 1 TABLET (250 MG TOTAL) BY MOUTH AT BEDTIME. (Patient not taking: Reported on 09/10/2022) 90 tablet 1   fluticasone (FLONASE) 50 MCG/ACT nasal spray SPRAY 2 SPRAYS INTO EACH NOSTRIL EVERY DAY (Patient taking differently: Place 2 sprays into both nostrils daily as needed for allergies.) 48 mL 3   Fluticasone-Umeclidin-Vilant (TRELEGY ELLIPTA) 200-62.5-25 MCG/ACT AEPB Inhale 1 puff into the lungs daily at 6 (six) AM. 1 each 0   glucose blood test strip  Use as instructed to test blood sugars up to 4 times daily 200 each 12   HYDROcodone-acetaminophen (NORCO) 5-325 MG tablet Take 1 tablet by mouth every 6 (six) hours as needed for moderate pain. 20 tablet 0   insulin lispro protamine-lispro (HUMALOG 75/25 MIX) (75-25) 100 UNIT/ML SUSP injection Inject 18 Units into the skin 2 (two) times daily with a meal. 10 units at lunch depending on bs reading (Patient taking differently: Inject 6 Units into the skin See admin instructions. Inject 6  units under the skin at breakfast and 6 units in the evening  PT does not takes if has not eaten) 40 mL 3   Insulin Syringe-Needle U-100 (INSULIN SYRINGE .3CC/29GX1") 29G X 1" 0.3 ML MISC 1 Device by Does not apply route in the morning and at bedtime. 200 each 3   isosorbide mononitrate (IMDUR) 60 MG 24 hr tablet Take one tablet ( 60 mg ) twice daily 8 hours apart. (Patient not taking: Reported on 09/10/2022) 180 tablet 3   meclizine (ANTIVERT) 25 MG tablet Take 25 mg by mouth 2 (two) times daily as needed for dizziness.     meloxicam (MOBIC) 15 MG tablet Take 15 mg by mouth daily.     metoprolol succinate (TOPROL-XL) 100 MG 24 hr tablet TAKE 1 TABLET BY MOUTH EVERY DAY 90 tablet 3   Multiple Vitamin (MULTIVITAMIN WITH MINERALS) TABS Take 1 tablet by mouth daily.     nitroGLYCERIN (NITROSTAT) 0.4 MG SL tablet Place 1 tablet (0.4 mg total) under the tongue every 5 (five) minutes as needed for chest pain. 25 tablet 3   ondansetron (ZOFRAN) 4 MG tablet Take 1 tablet (4 mg total) by mouth every 8 (eight) hours as needed for refractory nausea / vomiting, vomiting or nausea. 20 tablet 1   spironolactone (ALDACTONE) 25 MG tablet TAKE 1 TABLET BY MOUTH EVERY DAY 90 tablet 3   temazepam (RESTORIL) 30 MG capsule Take 1 capsule (30 mg total) by mouth daily. (Patient taking differently: Take 30 mg by mouth at bedtime.) 30 capsule 0   Current Facility-Administered Medications on File Prior to Visit  Medication Dose Route Frequency  Provider Last Rate Last Admin   Study - ORION 4 - inclisiran 300 mg/1.59mL or placebo SQ injection (PI-Stuckey)  300 mg Subcutaneous Q6 months Herby Abraham, MD   300 mg at 06/22/22 1015     Review of Systems     Objective:  There were no vitals filed for this visit. BP Readings from Last 3 Encounters:  09/10/22 132/69  08/30/22 138/76  06/26/22 95/62   Wt Readings from Last 3 Encounters:  09/09/22 163 lb 2.3 oz (74 kg)  08/30/22 163 lb (73.9 kg)  06/25/22 168 lb 6.9 oz (76.4 kg)   There is no height or weight on file to calculate BMI.    Physical Exam  Lab Results  Component Value Date   WBC 7.2 09/10/2022   HGB 11.6 (L) 09/10/2022   HCT 38.3 09/10/2022   PLT 246 09/10/2022   GLUCOSE 138 (H) 09/10/2022   CHOL 108 10/21/2020   TRIG 125 10/21/2020   HDL 27 (L) 10/21/2020   LDLCALC 58 10/21/2020   ALT 11 09/10/2022   AST 15 09/10/2022   NA 139 09/10/2022   K 4.3 09/10/2022   CL 103 09/10/2022   CREATININE 0.68 09/10/2022   BUN 13 09/10/2022   CO2 26 09/10/2022   TSH 0.718 09/09/2022   INR 1.1 07/28/2021   HGBA1C 6.6 (H) 06/09/2022   MICROALBUR 1.8 04/07/2022   MR BRAIN WO CONTRAST CLINICAL DATA:  Initial evaluation for neuro deficit, stroke suspected.  EXAM: MRI HEAD WITHOUT CONTRAST  TECHNIQUE: Multiplanar, multiecho pulse sequences of the brain and surrounding structures were obtained without intravenous contrast.  COMPARISON:  CT from earlier the same day.  FINDINGS: Brain: Examination mildly degraded by motion.  Cerebral volume within normal limits. No significant cerebral white matter disease for age. No evidence for acute or subacute ischemia. Gray-white matter differentiation maintained. No areas of chronic cortical infarction. No acute or chronic intracranial blood products.  No mass lesion, midline shift or mass effect. No hydrocephalus or extra-axial fluid collection. Empty sella.  Vascular: Major intracranial vascular flow  voids are maintained.  Skull and upper cervical spine: Craniocervical junction within normal limits. Bone marrow signal intensity normal. No scalp soft tissue abnormality.  Sinuses/Orbits: Prior bilateral ocular lens replacement. Paranasal sinuses are largely clear. No significant mastoid effusion.  Other: None.  IMPRESSION: 1. No acute intracranial abnormality. 2. Empty sella. While this finding is often incidental in nature and of no clinical significance, this can also be seen in the setting of idiopathic intracranial hypertension. 3. Otherwise negative brain MRI for age.  Electronically Signed   By: Rise Mu M.D.   On: 09/09/2022 20:24 EEG adult Chitravas, Numthip, MD     09/09/2022  5:56 PM [facility]  Routine EEG Report  Video Performed: Performed  Demographics: Patient Name:   [name], [name]  Date of Birth:   January 19, 1941  Identification Number:   MRN - [id number]  Study Times:  Study Start Time:   09/09/2022 17:16:00 Study End Time:   09/09/2022 17:47:00  Duration:   31 minutes  Indication(s): Encephalopathy  Technical Summary:  This EEG was performed utilizing standard International 10-20  System of electrode placement. One channel electrocardiogram was  monitored. Data were obtained and interpreted utilizing  referential montage recording, with reformatting to longitudinal,  transverse bipolar, and referential montages as necessary for  interpretation.  State(s):       Awake      Drowsy  Activation Procedures: Hyperventilation: Not performed Photic Stimulation: Not performed  EEG Description:  The background showed an absent of wakeful posterior dominant  alpha rhythm. There was a continuous of diffuse delta intermixed  with theta activity noted.  Intermittent generalized triphasic waves were seen.  Physiologic sleep stage II sleep spindle and vertex waves were  not recorded.   Throughout the record, there was no epileptiform abnormality or  lateralizing sign observed.  There was no clinical event or button-pushed event noted.  Intermittent electrode and movement artifacts were noted.  Impression:  This is an abnormal EEG showing moderate diffuse encephalopathy.  No lateralizing sign is observed.  Although the findings are non-specific in etiology, they are  commonly associated with metabolic encephalopathy.  There is no epileptiform abnormality, electrographic seizures or  evidence of status epilepticus present.  Dr Reece Levy  [facility] For Inpatient follow-up with [facility] physician please  call [facility] [phone]. This is not an outpatient service. Post  hospital discharge, please contact hospital directly.  Please call or reconsult our service if there are any clinical or  diagnostic changes. CT HEAD WO CONTRAST CLINICAL DATA:  Stroke suspected.  Unresponsive with vomiting.  EXAM: CT HEAD WITHOUT CONTRAST  TECHNIQUE: Contiguous axial images were obtained from the base of the skull through the vertex without intravenous contrast.  RADIATION DOSE REDUCTION: This exam was performed according to the departmental dose-optimization program which includes automated exposure control, adjustment of the mA and/or kV according to patient size and/or use of iterative reconstruction technique.  COMPARISON:  Head CT 12/20/2021.  FINDINGS: Brain: No evidence of acute infarction, hemorrhage, hydrocephalus, extra-axial collection or mass lesion/mass effect. Age normal brain volume and white matter appearance.  Vascular: No hyperdense vessel or unexpected calcification.  Skull: Normal. Negative for fracture or focal lesion.  Sinuses/Orbits: No acute finding.  IMPRESSION: No acute finding or explanation for symptoms.  Electronically Signed   By: Tiburcio Pea M.D.   On: 09/09/2022 14:28 DG Chest Portable 1 View CLINICAL  DATA:  Altered mental status, vomiting  EXAM: PORTABLE CHEST 1 VIEW  COMPARISON:  02/15/2022  FINDINGS: There is poor inspiration. Transverse diameter of heart is increased. Left hemidiaphragm is elevated. Small patchy densities are seen in left lower lung field. There is previous cardiac surgery, possibly coronary bypass surgery. Dense calcification is seen in mitral annulus. There is no significant pleural effusion or pneumothorax. There is previous bilateral shoulder arthroplasty.  IMPRESSION: Small patchy densities are seen in the left lower lung field which may suggest atelectasis/pneumonia. Part of this finding may be due to poor inspiration and high position of the left hemidiaphragm.  There are no signs of pulmonary edema. There is no pleural effusion or pneumothorax.  Electronically Signed   By: Ernie Avena M.D.   On: 09/09/2022 14:17    Assessment & Plan:    See Problem List for Assessment and Plan of chronic medical problems.

## 2022-09-20 NOTE — Patient Outreach (Signed)
  Care Coordination   09/20/2022 Name: CANDYCE ALCANTARA MRN: [id number] DOB: 07/30/1940   Care Coordination Outreach Attempts:  An unsuccessful telephone outreach was attempted today to offer the patient information about available care coordination services.  Follow Up Plan:  Additional outreach attempts will be made to offer the patient care coordination information and services.   Encounter Outcome:  No Answer   Care Coordination Interventions:  No, not indicated    Kelton Pillar. MSW, LCSW Licensed Visual [employer] [facility] Care Management [phone]

## 2022-09-20 NOTE — Patient Instructions (Signed)
      Blood work was ordered.   The lab is on the first floor.    Medications changes include :       A referral was ordered and someone will call you to schedule an appointment.     No follow-ups on file.  

## 2022-09-20 NOTE — Therapy (Unsigned)
OUTPATIENT PHYSICAL THERAPY SHOULDER EVALUATION   Patient Name: [name] MRN: [id number] DOB:Dec 10, 1940, 82 y.o., female Today's Date: 09/21/2022  END OF SESSION:  PT End of Session - 09/21/22 1409     Visit Number 2    Number of Visits 8    Authorization Type [employer] MCR    PT Start Time 1410   arrived late   PT Stop Time 1445    PT Time Calculation (min) 35 min    Activity Tolerance Patient tolerated treatment well    Behavior During Therapy WFL for tasks assessed/performed              Past Medical History:  Diagnosis Date   Adenomatous colon polyp    Allergy    Anxiety    Arthritis    Asthma    Carotid artery disease (HCC)    carotid US 02/2017: bilat ICA 1-39%   Chronic diastolic CHF    Echo 02/2017: EF 65-70, Gr 2 DD, mild MS (mean 5), PASP 44   COPD    pt is unsure if has been officially diagnosed   Coronary artery disease    CABG '09- cathed 12/09, 9/10, 6/11, 3/14 and 12/13/16- medical Rx // cath 12/2016 - 2/3 grafts patent >> med Rx // Myoview 12/17: low risk   Diabetes mellitus    Dyspnea    with exertion   Gastroesophageal reflux disease    Headache    Hiatal hernia    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   Hyperlipidemia    Hypertension    Myocardial infarction (HCC)    PONV (postoperative nausea and vomiting)    Schatzki's ring    Shoulder injury    resolved after shoulder surgery   Sleep apnea    not on cpap   Past Surgical History:  Procedure Laterality Date   ABDOMINAL HYSTERECTOMY     APPENDECTOMY     came out with Hysterectomy   CARDIAC CATHETERIZATION  07/23/2009   EF 60%   CARDIAC CATHETERIZATION  10/11/2008   CARDIAC CATHETERIZATION  03/01/2007   EF 75-80%   CARDIOVASCULAR STRESS TEST  11/15/2007   EF 60%   COLONOSCOPY     CORONARY ARTERY BYPASS GRAFT     SEVERELY DISEASED SAPHENOUS VEIN GRAFT TO THE RIGHT CORONARY ARTERY BUT WITH FAIRLY WELL PRESERVED FLOW TO THE DISTAL RIGHT CORONARY ARTERY FROM THE NATIVE  CIRCULATION-RESTART  CATH IN JUNE 2000, REVEALS MILD/MODERATE  CAD WITH GOOD FLOW DOWN HER LAD   ESOPHAGOGASTRODUODENOSCOPY     EYE SURGERY     bilateral cataract surgery with lens implant   LEFT HEART CATH AND CORS/GRAFTS ANGIOGRAPHY N/A 12/14/2016   Procedure: LEFT HEART CATH AND CORS/GRAFTS ANGIOGRAPHY;  Surgeon: Corky Crafts, MD;  Location: [facility];  Service: Cardiovascular;  Laterality: N/A;   lense removal Left    POLYPECTOMY     REVERSE SHOULDER ARTHROPLASTY Left 06/25/2022   Procedure: REVERSE SHOULDER ARTHROPLASTY;  Surgeon: Beverely Low, MD;  Location: [facility];  Service: Orthopedics;  Laterality: Left;  general and choice with interscalene block   RIGHT/LEFT HEART CATH AND CORONARY/GRAFT ANGIOGRAPHY N/A 07/28/2021   Procedure: RIGHT/LEFT HEART CATH AND CORONARY/GRAFT ANGIOGRAPHY;  Surgeon: Lennette Bihari, MD;  Location: [facility];  Service: Cardiovascular;  Laterality: N/A;   ROTATOR CUFF REPAIR     right and left   TONSILLECTOMY     age 36   TOTAL KNEE ARTHROPLASTY Right 02/20/2014  Procedure: RIGHT TOTAL KNEE ARTHROPLASTY;  Surgeon: Jacki Cones, MD;  Location: [facility];  Service: Orthopedics;  Laterality: Right;   tumor removed kidney     UPPER GASTROINTESTINAL ENDOSCOPY     US ECHOCARDIOGRAPHY  03/08/2008   EF 55-60%   Patient Active Problem List   Diagnosis Date Noted   AMS (altered mental status) 09/20/2022   Hypomagnesemia 09/20/2022   Hypoxia 09/09/2022   H/O total shoulder replacement, left 06/25/2022   IBS (irritable bowel syndrome) 06/02/2022   Diarrhea 04/07/2022   Hypoglycemia 12/20/2021   Chronic anticoagulation 12/20/2021   Syncope 12/20/2021   Cirrhosis (HCC) 12/20/2021   Left sided numbness 11/09/2021   Rib pain on left side 11/09/2021   Labile blood glucose 09/18/2021   Shortness of breath    Bilateral ankle pain 11/11/2020   Anxiety 09/09/2020   Coronary artery disease    Pelvic pain 10/30/2019   Chest pain at rest  08/24/2018   Arthralgia 08/01/2017   COPD with asthma 05/04/2017   Insomnia 11/23/2016   Chronic nonintractable headache 11/23/2016   Pancreatic insufficiency 10/28/2016   Carotid artery disease (HCC) 05/19/2016   Fatigue 05/19/2016   Anemia 03/27/2016   Type 1 diabetes mellitus (HCC) 07/21/2015   B12 deficiency-monthly B12 injections 03/31/2015   OSA (obstructive sleep apnea) with hypoxia 11/20/2014   Fecal incontinence 08/28/2014   Neurologic gait dysfunction 08/28/2014   Falls 08/28/2014   Neck pain of over 3 months duration 08/28/2014   H/O total knee replacement 02/20/2014   Obesity (BMI 30-39.9) 11/12/2013   Chronic diastolic CHF (congestive heart failure) (HCC) 11/17/2012   Meniscus, lateral, anterior horn derangement 11/10/2011   Medial meniscus, posterior horn derangement 11/10/2011   Osteoarthritis of right knee 11/10/2011   Vertigo 10/05/2011   Essential hypertension 10/05/2011   MUSCLE CRAMPS 12/29/2009   History of myocardial infarction 11/19/2009   Extrinsic asthma 11/19/2009   GERD 11/19/2009   Cough 11/19/2009   Hyperlipidemia 11/18/2009    PCP: Pincus Sanes, MD   REFERRING PROVIDER: Colvin Caroli, PA-C  REFERRING DIAG: increase rom s/p ,reverse total arthroplasty  THERAPY DIAG:  Muscle weakness (generalized)  Primary osteoarthritis of left shoulder  Rationale for Evaluation and Treatment: Rehabilitation  ONSET DATE: 06/25/22 for L reverse TSA   SUBJECTIVE:                                                                                                                                                                                      SUBJECTIVE STATEMENT:L shoulder sore following exercises and feels she may have overdone it.  Symptoms at baseline today. Hand dominance: Right  PERTINENT HISTORY: Underwent L TSA  reverse procedure on 06/24/22  PAIN:  Are you having pain? Yes: NPRS scale: 8/10 Pain location: L shoulder Pain description:  ache Aggravating factors: AROM Relieving factors: rest  PRECAUTIONS: Shoulder  RED FLAGS: None   WEIGHT BEARING RESTRICTIONS: No  FALLS:  Has patient fallen in last 6 months? No  OCCUPATION: retired  PLOF: Independent  PATIENT GOALS:To decrease my shoulder pain and use my arm again  NEXT MD VISIT:   OBJECTIVE:   DIAGNOSTIC FINDINGS:  None available  PATIENT SURVEYS:  FOTO 36(57 predicted)   POSTURE: Elevated L shoulder  UPPER EXTREMITY ROM:   A/PROM Right eval Left eval  Shoulder flexion  70/120d  Shoulder extension  30d  Shoulder abduction  70/80d  Shoulder adduction    Shoulder internal rotation    Shoulder external rotation    Elbow flexion    Elbow extension    Wrist flexion    Wrist extension    Wrist ulnar deviation    Wrist radial deviation    Wrist pronation    Wrist supination    (Blank rows = not tested)  UPPER EXTREMITY MMT:  MMT Right eval Left eval  Shoulder flexion  3  Shoulder extension  3+  Shoulder abduction  3  Shoulder adduction    Shoulder internal rotation    Shoulder external rotation    Middle trapezius    Lower trapezius    Elbow flexion  3+  Elbow extension  3+  Wrist flexion    Wrist extension    Wrist ulnar deviation    Wrist radial deviation    Wrist pronation    Wrist supination    Grip strength (lbs)    (Blank rows = not tested)  JOINT MOBILITY TESTING:  Deferred post-op  PALPATION:  Globally tender to subdeltoid region   TODAY'S TREATMENT:       [facility] Adult PT Treatment:                                                DATE: 09/21/22 Therapeutic Exercise: Supine press 10x2 no weight Supine flexion 10x2 Supine IR/ER 10x2 Supine flexion 10x2 Manual Therapy: PROM/STM 70d IR, 20d ER, 80d abduction, 90d FF                                                                                                                                  DATE: 09/06/22 Eval and HEP   PATIENT EDUCATION: Education  details: Discussed eval findings, rehab rationale and POC and patient is in agreement  Person educated: Patient Education method: Explanation Education comprehension: verbalized understanding and needs further education  HOME EXERCISE PROGRAM: Access Code: [email or web address] URL: [email or web address] Date: 09/06/2022 Prepared by: Gustavus Bryant  Exercises - Supine Shoulder Press with Dowel  - 2 x daily - 5 x weekly -  2 sets - 10 reps - Supine Shoulder Flexion Extension AAROM with Dowel  - 2 x daily - 5 x weekly - 2 sets - 10 reps - Seated Shoulder Shrugs  - 2 x daily - 5 x weekly - 2 sets - 10 reps - Seated Scapular Retraction  - 2 x daily - 5 x weekly - 2 sets - 10 reps  ASSESSMENT:  CLINICAL IMPRESSION: Patient returns for first f/u session.  Mild increased soreness as she overdid her exercises.  Focus of today was PROM and start of AROM.  Fair tolerance to session due to elevated soft tissue irritation and palpable trigger points through shoulder girdle musculature.  (Eval)Patient is a 82 y.o. female  who was seen today for physical therapy evaluation and treatment for post-op treatment following L R TSA.   Patient presents with ROM restrictions both active and passive as well as strength deficits in L deltoid and periscapular musculature.  Substitution patterns identified during AROM.    OBJECTIVE IMPAIRMENTS: decreased activity tolerance, decreased knowledge of condition, decreased mobility, decreased ROM, decreased strength, impaired UE functional use, postural dysfunction, and pain.   ACTIVITY LIMITATIONS: carrying, lifting, bathing, dressing, reach over head, and hygiene/grooming  PERSONAL FACTORS: Age, Past/current experiences, and Time since onset of injury/illness/exacerbation are also affecting patient's functional outcome.   REHAB POTENTIAL: Good  CLINICAL DECISION MAKING: Evolving/moderate complexity  EVALUATION COMPLEXITY: Low   GOALS: Goals reviewed with  patient? Yes  SHORT TERM GOALS: Target date: 10/04/2022    Patient to demonstrate independence in HEP  Baseline: [id number] Goal status: INITIAL  2.  Increase AROM of flexion and abduction to 90d Baseline:  A/PROM Right eval Left eval  Shoulder flexion  70/120d  Shoulder extension  30d  Shoulder abduction  70/80d   Goal status: INITIAL  3.  Decrease worst pain to 6/10 Baseline: 8/10 Goal status: INITIAL    LONG TERM GOALS: Target date: 11/01/2022    Increase AROM in flexion and abduction to 135d Baseline:  A/PROM Right eval Left eval  Shoulder flexion  70/120d  Shoulder extension  30d  Shoulder abduction  70/80d   Goal status: INITIAL  2.  Increase strength of L shoulder to 4/5 Baseline:  MMT Right eval Left eval  Shoulder flexion  3  Shoulder extension  3+  Shoulder abduction  3   Goal status: INITIAL  3.  Increase FOTO score to 57 Baseline: 36 Goal status: INITIAL  4.  Decrease worst pain to 4/10 Baseline: 8/10 Goal status: INITIAL    PLAN:  PT FREQUENCY: 1x/week  PT DURATION: 8 weeks  PLANNED INTERVENTIONS: Therapeutic exercises, Therapeutic activity, Neuromuscular re-education, Balance training, Gait training, Patient/Family education, Self Care, Joint mobilization, DME instructions, Aquatic Therapy, Dry Needling, Electrical stimulation, Cryotherapy, Moist heat, Manual therapy, and Re-evaluation  PLAN FOR NEXT SESSION: HEP review and update, manual techniques as appropriate, aerobic tasks, ROM and flexibility activities, strengthening and PREs, TPDN, gait and balance training as needed     Hildred Laser, PT 09/21/2022, 2:12 PM

## 2022-09-21 ENCOUNTER — Ambulatory Visit: Payer: Medicare Other | Attending: Internal Medicine

## 2022-09-21 ENCOUNTER — Ambulatory Visit (INDEPENDENT_AMBULATORY_CARE_PROVIDER_SITE_OTHER): Payer: Medicare Other | Admitting: Internal Medicine

## 2022-09-21 ENCOUNTER — Encounter: Payer: Self-pay | Admitting: Internal Medicine

## 2022-09-21 ENCOUNTER — Telehealth: Payer: Self-pay | Admitting: *Deleted

## 2022-09-21 VITALS — BP 122/80 | HR 80 | Temp 98.2°F | Ht 62.0 in | Wt 164.0 lb

## 2022-09-21 DIAGNOSIS — M19012 Primary osteoarthritis, left shoulder: Secondary | ICD-10-CM | POA: Diagnosis present

## 2022-09-21 DIAGNOSIS — G4733 Obstructive sleep apnea (adult) (pediatric): Secondary | ICD-10-CM

## 2022-09-21 DIAGNOSIS — R404 Transient alteration of awareness: Secondary | ICD-10-CM | POA: Diagnosis not present

## 2022-09-21 DIAGNOSIS — M6281 Muscle weakness (generalized): Secondary | ICD-10-CM | POA: Insufficient documentation

## 2022-09-21 DIAGNOSIS — R300 Dysuria: Secondary | ICD-10-CM

## 2022-09-21 DIAGNOSIS — I10 Essential (primary) hypertension: Secondary | ICD-10-CM | POA: Diagnosis not present

## 2022-09-21 DIAGNOSIS — R0902 Hypoxemia: Secondary | ICD-10-CM | POA: Diagnosis not present

## 2022-09-21 DIAGNOSIS — E1059 Type 1 diabetes mellitus with other circulatory complications: Secondary | ICD-10-CM | POA: Diagnosis not present

## 2022-09-21 DIAGNOSIS — R252 Cramp and spasm: Secondary | ICD-10-CM

## 2022-09-21 NOTE — Assessment & Plan Note (Signed)
Has been told in the past that she has nocturnal hypoxia, but has not needed CPAP In the past she has not wanted to be on oxygen at night, but is using it now and stressed the importance of continuing to use it which may improve several of her medical problems Will discuss with pulmonary if reevaluation for sleep apnea is necessary

## 2022-09-21 NOTE — Assessment & Plan Note (Signed)
Recheck magnesium today

## 2022-09-21 NOTE — Assessment & Plan Note (Signed)
Chronic Has had known nocturnal hypoxia for a while-has not really wanted to be on oxygen Was started on oxygen at the hospital and does have oxygen at home now Stressed that she needs to continue nocturnal oxygen supplementation

## 2022-09-21 NOTE — Assessment & Plan Note (Signed)
Chronic Blood pressure well controlled BMP, CBC Continue diltiazem 240 mg daily, isosorbide mononitrate 60 mg twice daily, metoprolol XL 100 mg daily, spironolactone 25 mg daily

## 2022-09-21 NOTE — Assessment & Plan Note (Signed)
Resolved Recent hospitalization for AMS Possibly related to aspiration pneumonia which she was treated for Concerned about polypharmacy, but she has been on these medications for a long time and denies taking anything differently.  Discussed the concerns with all of medication she is on currently Nocturnal hypoxia could have contributed as well-has been started on nocturnal oxygen and will continue nightly

## 2022-09-21 NOTE — Progress Notes (Signed)
  Care Coordination Note  09/21/2022 Name: [name] MRN: [id number] DOB: 1940/07/19  [name] is a 82 y.o. year old female who is a primary care patient of Lawerance Bach, Bobette Mo, MD and is actively engaged with the care management team. I reached out to [name] by phone today to assist with re-scheduling a follow up visit with the Licensed Clinical Social Worker  Follow up plan: Unsuccessful telephone outreach attempt made. A HIPAA compliant phone message was left for the patient providing contact information and requesting a return call.   [name], CCMA Care Coordination Care Guide Direct Dial: [phone]

## 2022-09-21 NOTE — Assessment & Plan Note (Signed)
Chronic Severe in nature at times Has been improved Hopefully using the oxygen at night will help ?  Baclofen causing some of her dizziness/lightheadedness/balance issues If no improvement with holding the baclofen she will restart it  ---  baclofen 40 mg twice daily

## 2022-09-21 NOTE — Assessment & Plan Note (Signed)
Chronic Sugars extremely variable at home and sugars not ideally controlled Has upcoming endocrine appointment

## 2022-09-23 ENCOUNTER — Ambulatory Visit: Payer: Medicare Other

## 2022-09-23 DIAGNOSIS — M19012 Primary osteoarthritis, left shoulder: Secondary | ICD-10-CM

## 2022-09-23 DIAGNOSIS — M6281 Muscle weakness (generalized): Secondary | ICD-10-CM

## 2022-09-23 LAB — MICROALBUMIN / CREATININE URINE RATIO
Creatinine,U: 150.2 mg/dL
Microalb Creat Ratio: 1 mg/g (ref 0.0–30.0)
Microalb, Ur: 1.6 mg/dL (ref 0.0–1.9)

## 2022-09-23 LAB — URINALYSIS, ROUTINE W REFLEX MICROSCOPIC
Bilirubin Urine: NEGATIVE
Hgb urine dipstick: NEGATIVE
Ketones, ur: NEGATIVE
Nitrite: NEGATIVE
RBC / HPF: NONE SEEN (ref 0–?)
Specific Gravity, Urine: 1.025 (ref 1.000–1.030)
Total Protein, Urine: NEGATIVE
Urine Glucose: NEGATIVE
Urobilinogen, UA: 0.2 (ref 0.0–1.0)
pH: 6 (ref 5.0–8.0)

## 2022-09-23 LAB — CBC WITH DIFFERENTIAL/PLATELET
Basophils Absolute: 0 10*3/uL (ref 0.0–0.1)
Basophils Relative: 0.9 % (ref 0.0–3.0)
Eosinophils Absolute: 0.1 10*3/uL (ref 0.0–0.7)
Eosinophils Relative: 2.1 % (ref 0.0–5.0)
HCT: 40.7 % (ref 36.0–46.0)
Hemoglobin: 12.6 g/dL (ref 12.0–15.0)
Lymphocytes Relative: 39.3 % (ref 12.0–46.0)
Lymphs Abs: 2.2 10*3/uL (ref 0.7–4.0)
MCHC: 31.1 g/dL (ref 30.0–36.0)
MCV: 82.6 fl (ref 78.0–100.0)
Monocytes Absolute: 0.4 10*3/uL (ref 0.1–1.0)
Monocytes Relative: 7.5 % (ref 3.0–12.0)
Neutro Abs: 2.8 10*3/uL (ref 1.4–7.7)
Neutrophils Relative %: 50.2 % (ref 43.0–77.0)
Platelets: 257 10*3/uL (ref 150.0–400.0)
RBC: 4.93 Mil/uL (ref 3.87–5.11)
RDW: 16.8 % — ABNORMAL HIGH (ref 11.5–15.5)
WBC: 5.6 10*3/uL (ref 4.0–10.5)

## 2022-09-23 LAB — BASIC METABOLIC PANEL
BUN: 27 mg/dL — ABNORMAL HIGH (ref 6–23)
CO2: 30 mEq/L (ref 19–32)
Calcium: 10 mg/dL (ref 8.4–10.5)
Chloride: 98 mEq/L (ref 96–112)
Creatinine, Ser: 0.86 mg/dL (ref 0.40–1.20)
GFR: 62.23 mL/min (ref >60.00)
Glucose, Bld: 202 mg/dL — ABNORMAL HIGH (ref 70–99)
Potassium: 4.3 mEq/L (ref 3.5–5.1)
Sodium: 135 mEq/L (ref 135–145)

## 2022-09-23 LAB — MAGNESIUM: Magnesium: 1.9 mg/dL (ref 1.5–2.5)

## 2022-09-23 NOTE — Progress Notes (Signed)
  Care Coordination Note  09/23/2022 Name: [name] MRN: [id number] DOB: 22-Apr-1940  [name] is a 82 y.o. year old female who is a primary care patient of Lawerance Bach, Bobette Mo, MD and is actively engaged with the care management team. I reached out to [name] by phone today to assist with re-scheduling a follow up visit with the Licensed Clinical Social Worker  Follow up plan: 2nd Unsuccessful telephone outreach attempt made. A HIPAA compliant phone message was left for the patient providing contact information and requesting a return call.   [name], CCMA Care Coordination Care Guide Direct Dial: [phone]

## 2022-09-23 NOTE — Therapy (Signed)
OUTPATIENT PHYSICAL THERAPY SHOULDER EVALUATION   Patient Name: [name] MRN: [id number] DOB:1940-02-16, 82 y.o., female Today's Date: 09/23/2022  END OF SESSION:  PT End of Session - 09/23/22 1617     Visit Number 3    Number of Visits 8    Date for PT Re-Evaluation 11/01/22    Authorization Type [employer] MCR    PT Start Time 1615    PT Stop Time 1655    PT Time Calculation (min) 40 min    Activity Tolerance Patient tolerated treatment well    Behavior During Therapy WFL for tasks assessed/performed               Past Medical History:  Diagnosis Date   Adenomatous colon polyp    Allergy    Anxiety    Arthritis    Asthma    Carotid artery disease (HCC)    carotid US 02/2017: bilat ICA 1-39%   Chronic diastolic CHF    Echo 02/2017: EF 65-70, Gr 2 DD, mild MS (mean 5), PASP 44   COPD    pt is unsure if has been officially diagnosed   Coronary artery disease    CABG '09- cathed 12/09, 9/10, 6/11, 3/14 and 12/13/16- medical Rx // cath 12/2016 - 2/3 grafts patent >> med Rx // Myoview 12/17: low risk   Diabetes mellitus    Dyspnea    with exertion   Gastroesophageal reflux disease    Headache    Hiatal hernia    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   Hyperlipidemia    Hypertension    Myocardial infarction (HCC)    PONV (postoperative nausea and vomiting)    Schatzki's ring    Shoulder injury    resolved after shoulder surgery   Sleep apnea    not on cpap   Past Surgical History:  Procedure Laterality Date   ABDOMINAL HYSTERECTOMY     APPENDECTOMY     came out with Hysterectomy   CARDIAC CATHETERIZATION  07/23/2009   EF 60%   CARDIAC CATHETERIZATION  10/11/2008   CARDIAC CATHETERIZATION  03/01/2007   EF 75-80%   CARDIOVASCULAR STRESS TEST  11/15/2007   EF 60%   COLONOSCOPY     CORONARY ARTERY BYPASS GRAFT     SEVERELY DISEASED SAPHENOUS VEIN GRAFT TO THE RIGHT CORONARY ARTERY BUT WITH FAIRLY WELL PRESERVED FLOW TO THE DISTAL RIGHT CORONARY ARTERY  FROM THE NATIVE CIRCULATION-RESTART  CATH IN JUNE 2000, REVEALS MILD/MODERATE  CAD WITH GOOD FLOW DOWN HER LAD   ESOPHAGOGASTRODUODENOSCOPY     EYE SURGERY     bilateral cataract surgery with lens implant   LEFT HEART CATH AND CORS/GRAFTS ANGIOGRAPHY N/A 12/14/2016   Procedure: LEFT HEART CATH AND CORS/GRAFTS ANGIOGRAPHY;  Surgeon: Corky Crafts, MD;  Location: [facility];  Service: Cardiovascular;  Laterality: N/A;   lense removal Left    POLYPECTOMY     REVERSE SHOULDER ARTHROPLASTY Left 06/25/2022   Procedure: REVERSE SHOULDER ARTHROPLASTY;  Surgeon: Beverely Low, MD;  Location: [facility];  Service: Orthopedics;  Laterality: Left;  general and choice with interscalene block   RIGHT/LEFT HEART CATH AND CORONARY/GRAFT ANGIOGRAPHY N/A 07/28/2021   Procedure: RIGHT/LEFT HEART CATH AND CORONARY/GRAFT ANGIOGRAPHY;  Surgeon: Lennette Bihari, MD;  Location: [facility];  Service: Cardiovascular;  Laterality: N/A;   ROTATOR CUFF REPAIR     right and left   TONSILLECTOMY     age 23  TOTAL KNEE ARTHROPLASTY Right 02/20/2014   Procedure: RIGHT TOTAL KNEE ARTHROPLASTY;  Surgeon: Jacki Cones, MD;  Location: [facility];  Service: Orthopedics;  Laterality: Right;   tumor removed kidney     UPPER GASTROINTESTINAL ENDOSCOPY     US ECHOCARDIOGRAPHY  03/08/2008   EF 55-60%   Patient Active Problem List   Diagnosis Date Noted   AMS (altered mental status) 09/20/2022   Hypomagnesemia 09/20/2022   Hypoxia 09/09/2022   H/O total shoulder replacement, left 06/25/2022   IBS (irritable bowel syndrome) 06/02/2022   Diarrhea 04/07/2022   Hypoglycemia 12/20/2021   Chronic anticoagulation 12/20/2021   Syncope 12/20/2021   Cirrhosis (HCC) 12/20/2021   Left sided numbness 11/09/2021   Rib pain on left side 11/09/2021   Labile blood glucose 09/18/2021   Shortness of breath    Bilateral ankle pain 11/11/2020   Anxiety 09/09/2020   Coronary artery disease    Pelvic pain 10/30/2019   Chest  pain at rest 08/24/2018   Arthralgia 08/01/2017   COPD with asthma 05/04/2017   Insomnia 11/23/2016   Chronic nonintractable headache 11/23/2016   Pancreatic insufficiency 10/28/2016   Carotid artery disease (HCC) 05/19/2016   Fatigue 05/19/2016   Anemia 03/27/2016   Type 1 diabetes mellitus (HCC) 07/21/2015   B12 deficiency-monthly B12 injections 03/31/2015   OSA (obstructive sleep apnea) with hypoxia 11/20/2014   Fecal incontinence 08/28/2014   Neurologic gait dysfunction 08/28/2014   Falls 08/28/2014   Neck pain of over 3 months duration 08/28/2014   H/O total knee replacement 02/20/2014   Obesity (BMI 30-39.9) 11/12/2013   Chronic diastolic CHF (congestive heart failure) (HCC) 11/17/2012   Meniscus, lateral, anterior horn derangement 11/10/2011   Medial meniscus, posterior horn derangement 11/10/2011   Osteoarthritis of right knee 11/10/2011   Vertigo 10/05/2011   Essential hypertension 10/05/2011   MUSCLE CRAMPS 12/29/2009   History of myocardial infarction 11/19/2009   Extrinsic asthma 11/19/2009   GERD 11/19/2009   Cough 11/19/2009   Hyperlipidemia 11/18/2009    PCP: Pincus Sanes, MD   REFERRING PROVIDER: Colvin Caroli, PA-C  REFERRING DIAG: increase rom s/p ,reverse total arthroplasty  THERAPY DIAG:  Muscle weakness (generalized)  Primary osteoarthritis of left shoulder  Rationale for Evaluation and Treatment: Rehabilitation  ONSET DATE: 06/25/22 for L reverse TSA   SUBJECTIVE:                                                                                                                                                                                      SUBJECTIVE STATEMENT:L shoulder sore following exercises and feels she may have overdone it.  Symptoms at baseline today. Hand dominance:  Right  PERTINENT HISTORY: Underwent L TSA reverse procedure on 06/24/22  PAIN:  Are you having pain? Yes: NPRS scale: 8/10 Pain location: L shoulder Pain  description: ache Aggravating factors: AROM Relieving factors: rest  PRECAUTIONS: Shoulder  RED FLAGS: None   WEIGHT BEARING RESTRICTIONS: No  FALLS:  Has patient fallen in last 6 months? No  OCCUPATION: retired  PLOF: Independent  PATIENT GOALS:To decrease my shoulder pain and use my arm again  NEXT MD VISIT:   OBJECTIVE:   DIAGNOSTIC FINDINGS:  None available  PATIENT SURVEYS:  FOTO 36(57 predicted)   POSTURE: Elevated L shoulder  UPPER EXTREMITY ROM:   A/PROM Right eval Left eval  Shoulder flexion  70/120d  Shoulder extension  30d  Shoulder abduction  70/80d  Shoulder adduction    Shoulder internal rotation    Shoulder external rotation    Elbow flexion    Elbow extension    Wrist flexion    Wrist extension    Wrist ulnar deviation    Wrist radial deviation    Wrist pronation    Wrist supination    (Blank rows = not tested)  UPPER EXTREMITY MMT:  MMT Right eval Left eval  Shoulder flexion  3  Shoulder extension  3+  Shoulder abduction  3  Shoulder adduction    Shoulder internal rotation    Shoulder external rotation    Middle trapezius    Lower trapezius    Elbow flexion  3+  Elbow extension  3+  Wrist flexion    Wrist extension    Wrist ulnar deviation    Wrist radial deviation    Wrist pronation    Wrist supination    Grip strength (lbs)    (Blank rows = not tested)  JOINT MOBILITY TESTING:  Deferred post-op  PALPATION:  Globally tender to subdeltoid region   TODAY'S TREATMENT:       [facility] Adult PT Treatment:                                                DATE: 09/23/22 Therapeutic Exercise: OH pulleys 5 min Supine press 15x no weight Supine flexion 15x(135d) Supine IR/ER 15x Supine abd 15x Manual Therapy: PROM/STM 70d IR, 40d ER, 90d abduction, 110d FF 4 way scapula 15x ea  Scar mobilization  [facility] Adult PT Treatment:                                                DATE: 09/21/22 Therapeutic Exercise: Supine press  10x2 no weight Supine flexion 10x2 Supine IR/ER 10x2 Supine flexion 10x2 Manual Therapy: PROM/STM 70d IR, 20d ER, 80d abduction, 90d FF  DATE: 09/06/22 Eval and HEP   PATIENT EDUCATION: Education details: Discussed eval findings, rehab rationale and POC and patient is in agreement  Person educated: Patient Education method: Explanation Education comprehension: verbalized understanding and needs further education  HOME EXERCISE PROGRAM: Access Code: [email or web address] URL: [email or web address] Date: 09/06/2022 Prepared by: Gustavus Bryant  Exercises - Supine Shoulder Press with Dowel  - 2 x daily - 5 x weekly - 2 sets - 10 reps - Supine Shoulder Flexion Extension AAROM with Dowel  - 2 x daily - 5 x weekly - 2 sets - 10 reps - Seated Shoulder Shrugs  - 2 x daily - 5 x weekly - 2 sets - 10 reps - Seated Scapular Retraction  - 2 x daily - 5 x weekly - 2 sets - 10 reps  ASSESSMENT:  CLINICAL IMPRESSION: Continued to focus on PROM with increases noted.  Added OH pulley and scapula mobilization.  Increased supine flexion to 135d.  Added scar mob prior to AROM  (Eval)Patient is a 82 y.o. female  who was seen today for physical therapy evaluation and treatment for post-op treatment following L R TSA.   Patient presents with ROM restrictions both active and passive as well as strength deficits in L deltoid and periscapular musculature.  Substitution patterns identified during AROM.    OBJECTIVE IMPAIRMENTS: decreased activity tolerance, decreased knowledge of condition, decreased mobility, decreased ROM, decreased strength, impaired UE functional use, postural dysfunction, and pain.   ACTIVITY LIMITATIONS: carrying, lifting, bathing, dressing, reach over head, and hygiene/grooming  PERSONAL FACTORS: Age, Past/current experiences, and Time since onset of  injury/illness/exacerbation are also affecting patient's functional outcome.   REHAB POTENTIAL: Good  CLINICAL DECISION MAKING: Evolving/moderate complexity  EVALUATION COMPLEXITY: Low   GOALS: Goals reviewed with patient? Yes  SHORT TERM GOALS: Target date: 10/04/2022    Patient to demonstrate independence in HEP  Baseline: [id number] Goal status: INITIAL  2.  Increase AROM of flexion and abduction to 90d Baseline:  A/PROM Right eval Left eval  Shoulder flexion  70/120d  Shoulder extension  30d  Shoulder abduction  70/80d   Goal status: INITIAL  3.  Decrease worst pain to 6/10 Baseline: 8/10 Goal status: INITIAL    LONG TERM GOALS: Target date: 11/01/2022    Increase AROM in flexion and abduction to 135d Baseline:  A/PROM Right eval Left eval  Shoulder flexion  70/120d  Shoulder extension  30d  Shoulder abduction  70/80d   Goal status: INITIAL  2.  Increase strength of L shoulder to 4/5 Baseline:  MMT Right eval Left eval  Shoulder flexion  3  Shoulder extension  3+  Shoulder abduction  3   Goal status: INITIAL  3.  Increase FOTO score to 57 Baseline: 36 Goal status: INITIAL  4.  Decrease worst pain to 4/10 Baseline: 8/10 Goal status: INITIAL    PLAN:  PT FREQUENCY: 1x/week  PT DURATION: 8 weeks  PLANNED INTERVENTIONS: Therapeutic exercises, Therapeutic activity, Neuromuscular re-education, Balance training, Gait training, Patient/Family education, Self Care, Joint mobilization, DME instructions, Aquatic Therapy, Dry Needling, Electrical stimulation, Cryotherapy, Moist heat, Manual therapy, and Re-evaluation  PLAN FOR NEXT SESSION: HEP review and update, manual techniques as appropriate, aerobic tasks, ROM and flexibility activities, strengthening and PREs, TPDN, gait and balance training as needed     Hildred Laser, PT 09/23/2022, 4:58 PM

## 2022-09-25 LAB — URINE CULTURE

## 2022-09-28 ENCOUNTER — Ambulatory Visit: Payer: Medicare Other

## 2022-09-28 NOTE — Progress Notes (Signed)
  Care Coordination Note  09/28/2022 Name: [name] MRN: [id number] DOB: 15-Feb-1940  [name] is a 82 y.o. year old female who is a primary care patient of Lawerance Bach, Bobette Mo, MD and is actively engaged with the care management team. I reached out to [name] by phone today to assist with re-scheduling a follow up visit with the Licensed Clinical Social Worker  Follow up plan: We have been unable to make contact with the patient for follow up.   [name], CCMA Care Coordination Care Guide Direct Dial: [phone]

## 2022-09-30 ENCOUNTER — Ambulatory Visit: Payer: Medicare Other

## 2022-09-30 ENCOUNTER — Telehealth: Payer: Self-pay

## 2022-09-30 DIAGNOSIS — M19012 Primary osteoarthritis, left shoulder: Secondary | ICD-10-CM

## 2022-09-30 DIAGNOSIS — M6281 Muscle weakness (generalized): Secondary | ICD-10-CM

## 2022-09-30 NOTE — Telephone Encounter (Signed)
Called and LVM. Patient needs to schedule 1x a week until 11/01/22 for PT with JE[facility] OR STEPHANIE. ---Christoper Fabian

## 2022-09-30 NOTE — Therapy (Addendum)
OUTPATIENT PHYSICAL THERAPY TREATMENT NOTE   Patient Name: [name] MRN: [id number] DOB:1940/08/23, 82 y.o., female Today's Date: 09/30/2022 PHYSICAL THERAPY DISCHARGE SUMMARY  Visits from Start of Care: 4  Current functional level related to goals / functional outcomes: UTA   Remaining deficits: UTA   Education / Equipment: HEP   Patient agrees to discharge. Patient goals were partially met. Patient is being discharged due to not returning since the last visit.  END OF SESSION:      Past Medical History:  Diagnosis Date   Adenomatous colon polyp    Allergy    Anxiety    Arthritis    Asthma    Carotid artery disease (HCC)    carotid US 02/2017: bilat ICA 1-39%   Chronic diastolic CHF    Echo 02/2017: EF 65-70, Gr 2 DD, mild MS (mean 5), PASP 44   COPD    pt is unsure if has been officially diagnosed   Coronary artery disease    CABG '09- cathed 12/09, 9/10, 6/11, 3/14 and 12/13/16- medical Rx // cath 12/2016 - 2/3 grafts patent >> med Rx // Myoview 12/17: low risk   Diabetes mellitus    Dyspnea    with exertion   Gastroesophageal reflux disease    Headache    Hiatal hernia    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   Hyperlipidemia    Hypertension    Myocardial infarction (HCC)    PONV (postoperative nausea and vomiting)    Schatzki's ring    Shoulder injury    resolved after shoulder surgery   Sleep apnea    not on cpap   Past Surgical History:  Procedure Laterality Date   ABDOMINAL HYSTERECTOMY     APPENDECTOMY     came out with Hysterectomy   CARDIAC CATHETERIZATION  07/23/2009   EF 60%   CARDIAC CATHETERIZATION  10/11/2008   CARDIAC CATHETERIZATION  03/01/2007   EF 75-80%   CARDIOVASCULAR STRESS TEST  11/15/2007   EF 60%   COLONOSCOPY     CORONARY ARTERY BYPASS GRAFT     SEVERELY DISEASED SAPHENOUS VEIN GRAFT TO THE RIGHT CORONARY ARTERY BUT WITH FAIRLY WELL PRESERVED FLOW TO THE DISTAL RIGHT CORONARY ARTERY FROM THE NATIVE  CIRCULATION-RESTART  CATH IN JUNE 2000, REVEALS MILD/MODERATE  CAD WITH GOOD FLOW DOWN HER LAD   ESOPHAGOGASTRODUODENOSCOPY     EYE SURGERY     bilateral cataract surgery with lens implant   LEFT HEART CATH AND CORS/GRAFTS ANGIOGRAPHY N/A 12/14/2016   Procedure: LEFT HEART CATH AND CORS/GRAFTS ANGIOGRAPHY;  Surgeon: Corky Crafts, MD;  Location: [facility];  Service: Cardiovascular;  Laterality: N/A;   lense removal Left    POLYPECTOMY     REVERSE SHOULDER ARTHROPLASTY Left 06/25/2022   Procedure: REVERSE SHOULDER ARTHROPLASTY;  Surgeon: Beverely Low, MD;  Location: [facility];  Service: Orthopedics;  Laterality: Left;  general and choice with interscalene block   RIGHT/LEFT HEART CATH AND CORONARY/GRAFT ANGIOGRAPHY N/A 07/28/2021   Procedure: RIGHT/LEFT HEART CATH AND CORONARY/GRAFT ANGIOGRAPHY;  Surgeon: Lennette Bihari, MD;  Location: [facility];  Service: Cardiovascular;  Laterality: N/A;   ROTATOR CUFF REPAIR     right and left   TONSILLECTOMY     age 72   TOTAL KNEE ARTHROPLASTY Right 02/20/2014   Procedure: RIGHT TOTAL KNEE ARTHROPLASTY;  Surgeon: Jacki Cones, MD;  Location: [facility];  Service: Orthopedics;  Laterality: Right;   tumor removed  kidney     UPPER GASTROINTESTINAL ENDOSCOPY     US ECHOCARDIOGRAPHY  03/08/2008   EF 55-60%   Patient Active Problem List   Diagnosis Date Noted   AMS (altered mental status) 09/20/2022   Hypomagnesemia 09/20/2022   Hypoxia 09/09/2022   H/O total shoulder replacement, left 06/25/2022   IBS (irritable bowel syndrome) 06/02/2022   Diarrhea 04/07/2022   Hypoglycemia 12/20/2021   Chronic anticoagulation 12/20/2021   Syncope 12/20/2021   Cirrhosis (HCC) 12/20/2021   Left sided numbness 11/09/2021   Rib pain on left side 11/09/2021   Labile blood glucose 09/18/2021   Shortness of breath    Bilateral ankle pain 11/11/2020   Anxiety 09/09/2020   Coronary artery disease    Pelvic pain 10/30/2019   Chest pain at rest  08/24/2018   Arthralgia 08/01/2017   COPD with asthma 05/04/2017   Insomnia 11/23/2016   Chronic nonintractable headache 11/23/2016   Pancreatic insufficiency 10/28/2016   Carotid artery disease (HCC) 05/19/2016   Fatigue 05/19/2016   Anemia 03/27/2016   Type 1 diabetes mellitus (HCC) 07/21/2015   B12 deficiency-monthly B12 injections 03/31/2015   OSA (obstructive sleep apnea) with hypoxia 11/20/2014   Fecal incontinence 08/28/2014   Neurologic gait dysfunction 08/28/2014   Falls 08/28/2014   Neck pain of over 3 months duration 08/28/2014   H/O total knee replacement 02/20/2014   Obesity (BMI 30-39.9) 11/12/2013   Chronic diastolic CHF (congestive heart failure) (HCC) 11/17/2012   Meniscus, lateral, anterior horn derangement 11/10/2011   Medial meniscus, posterior horn derangement 11/10/2011   Osteoarthritis of right knee 11/10/2011   Vertigo 10/05/2011   Essential hypertension 10/05/2011   MUSCLE CRAMPS 12/29/2009   History of myocardial infarction 11/19/2009   Extrinsic asthma 11/19/2009   GERD 11/19/2009   Cough 11/19/2009   Hyperlipidemia 11/18/2009    PCP: Pincus Sanes, MD   REFERRING PROVIDER: Colvin Caroli, PA-C  REFERRING DIAG: increase rom s/p ,reverse total arthroplasty  THERAPY DIAG:  No diagnosis found.  Rationale for Evaluation and Treatment: Rehabilitation  ONSET DATE: 06/25/22 for L reverse TSA   SUBJECTIVE:                                                                                                                                                                                      SUBJECTIVE STATEMENT: Patient reports that her pain continues and that she saw her surgeon on Tuesday who states everything is healing well. She does mention that her 3rd and 4th fingers are numb.   Hand dominance: Right  PERTINENT HISTORY: Underwent L TSA reverse procedure on 06/24/22  PAIN:  Are you having pain? Yes: NPRS scale:  6/10 Pain location: L  shoulder Pain description: ache Aggravating factors: AROM Relieving factors: rest  PRECAUTIONS: Shoulder  RED FLAGS: None   WEIGHT BEARING RESTRICTIONS: No  FALLS:  Has patient fallen in last 6 months? No  OCCUPATION: retired  PLOF: Independent  PATIENT GOALS:To decrease my shoulder pain and use my arm again  NEXT MD VISIT:   OBJECTIVE:   DIAGNOSTIC FINDINGS:  None available  PATIENT SURVEYS:  FOTO 36(57 predicted)   POSTURE: Elevated L shoulder  UPPER EXTREMITY ROM:   A/PROM Right eval Left eval  Shoulder flexion  70/120d  Shoulder extension  30d  Shoulder abduction  70/80d  Shoulder adduction    Shoulder internal rotation    Shoulder external rotation    Elbow flexion    Elbow extension    Wrist flexion    Wrist extension    Wrist ulnar deviation    Wrist radial deviation    Wrist pronation    Wrist supination    (Blank rows = not tested)  UPPER EXTREMITY MMT:  MMT Right eval Left eval  Shoulder flexion  3  Shoulder extension  3+  Shoulder abduction  3  Shoulder adduction    Shoulder internal rotation    Shoulder external rotation    Middle trapezius    Lower trapezius    Elbow flexion  3+  Elbow extension  3+  Wrist flexion    Wrist extension    Wrist ulnar deviation    Wrist radial deviation    Wrist pronation    Wrist supination    Grip strength (lbs)    (Blank rows = not tested)  JOINT MOBILITY TESTING:  Deferred post-op  PALPATION:  Globally tender to subdeltoid region   TODAY'S TREATMENT:     [facility] Adult PT Treatment:                                                DATE: 09/28/22 Therapeutic Exercise: OH pulleys 3 min Supine press 2x10 no weight Supine flexion x10, x5 Supine IR/ER 15x Manual Therapy: PROM/STM 70d IR, 40d ER, 90d abduction, 110d FF     [facility] Adult PT Treatment:                                                DATE: 09/23/22 Therapeutic Exercise: OH pulleys 5 min Supine press 15x no weight Supine  flexion 15x(135d) Supine IR/ER 15x Supine abd 15x Manual Therapy: PROM/STM 70d IR, 40d ER, 90d abduction, 110d FF 4 way scapula 15x ea  Scar mobilization  [facility] Adult PT Treatment:                                                DATE: 09/21/22 Therapeutic Exercise: Supine press 10x2 no weight Supine flexion 10x2 Supine IR/ER 10x2 Supine flexion 10x2 Manual Therapy: PROM/STM 70d IR, 20d ER, 80d abduction, 90d FF  PATIENT EDUCATION: Education details: Discussed eval findings, rehab rationale and POC and patient is in agreement  Person educated: Patient Education method: Explanation Education comprehension: verbalized understanding and needs further education  HOME EXERCISE PROGRAM: Access Code: [email or web address] URL: [email or web address] Date: 09/06/2022 Prepared by: Gustavus Bryant  Exercises - Supine Shoulder Press with Dowel  - 2 x daily - 5 x weekly - 2 sets - 10 reps - Supine Shoulder Flexion Extension AAROM with Dowel  - 2 x daily - 5 x weekly - 2 sets - 10 reps - Seated Shoulder Shrugs  - 2 x daily - 5 x weekly - 2 sets - 10 reps - Seated Scapular Retraction  - 2 x daily - 5 x weekly - 2 sets - 10 reps  ASSESSMENT:  CLINICAL IMPRESSION:  Patient presents to PT reporting continued pain in her Lt shoulder and HEP compliance. Session today continued to focus on ROM passively and active. She is somewhat limited by pain throughout session. Patient continues to benefit from skilled PT services and should be progressed as able to improve functional independence.    (Eval)Patient is a 82 y.o. female  who was seen today for physical therapy evaluation and treatment for post-op treatment following L R TSA.   Patient presents with ROM restrictions both active and passive as well as strength deficits in L deltoid and periscapular musculature.   Substitution patterns identified during AROM.    OBJECTIVE IMPAIRMENTS: decreased activity tolerance, decreased knowledge of condition, decreased mobility, decreased ROM, decreased strength, impaired UE functional use, postural dysfunction, and pain.   ACTIVITY LIMITATIONS: carrying, lifting, bathing, dressing, reach over head, and hygiene/grooming  PERSONAL FACTORS: Age, Past/current experiences, and Time since onset of injury/illness/exacerbation are also affecting patient's functional outcome.   REHAB POTENTIAL: Good  CLINICAL DECISION MAKING: Evolving/moderate complexity  EVALUATION COMPLEXITY: Low   GOALS: Goals reviewed with patient? Yes  SHORT TERM GOALS: Target date: 10/04/2022    Patient to demonstrate independence in HEP  Baseline: [id number] Goal status: INITIAL  2.  Increase AROM of flexion and abduction to 90d Baseline:  A/PROM Right eval Left eval  Shoulder flexion  70/120d  Shoulder extension  30d  Shoulder abduction  70/80d   Goal status: INITIAL  3.  Decrease worst pain to 6/10 Baseline: 8/10 Goal status: INITIAL    LONG TERM GOALS: Target date: 11/01/2022    Increase AROM in flexion and abduction to 135d Baseline:  A/PROM Right eval Left eval  Shoulder flexion  70/120d  Shoulder extension  30d  Shoulder abduction  70/80d   Goal status: INITIAL  2.  Increase strength of L shoulder to 4/5 Baseline:  MMT Right eval Left eval  Shoulder flexion  3  Shoulder extension  3+  Shoulder abduction  3   Goal status: INITIAL  3.  Increase FOTO score to 57 Baseline: 36 Goal status: INITIAL  4.  Decrease worst pain to 4/10 Baseline: 8/10 Goal status: INITIAL    PLAN:  PT FREQUENCY: 1x/week  PT DURATION: 8 weeks  PLANNED INTERVENTIONS: Therapeutic exercises, Therapeutic activity, Neuromuscular re-education, Balance training, Gait training, Patient/Family education, Self Care, Joint mobilization, DME instructions, Aquatic Therapy, Dry  Needling, Electrical stimulation, Cryotherapy, Moist heat, Manual therapy, and Re-evaluation  PLAN FOR NEXT SESSION: HEP review and update, manual techniques as appropriate, aerobic tasks, ROM and flexibility activities, strengthening and PREs, TPDN, gait and balance training as needed     [name], PTA 09/30/2022, 8:19 AM

## 2022-10-05 ENCOUNTER — Encounter: Payer: Self-pay | Admitting: Internal Medicine

## 2022-10-05 ENCOUNTER — Ambulatory Visit: Payer: Medicare Other | Admitting: Internal Medicine

## 2022-10-05 VITALS — BP 126/82 | HR 70 | Ht 62.0 in | Wt 164.0 lb

## 2022-10-05 DIAGNOSIS — E1059 Type 1 diabetes mellitus with other circulatory complications: Secondary | ICD-10-CM | POA: Diagnosis not present

## 2022-10-05 DIAGNOSIS — M79671 Pain in right foot: Secondary | ICD-10-CM | POA: Diagnosis not present

## 2022-10-05 LAB — POCT GLYCOSYLATED HEMOGLOBIN (HGB A1C): Hemoglobin A1C: 7.3 % — AB (ref 4.0–5.6)

## 2022-10-05 LAB — POCT GLUCOSE (DEVICE FOR HOME USE): Glucose Fasting, POC: 179 mg/dL — AB (ref 70–99)

## 2022-10-05 NOTE — Progress Notes (Signed)
Name: [name]  MRN/ DOB: [id number], 13-Feb-1940   Age/ Sex: 82 y.o., female    PCP: Pincus Sanes, MD   Reason for Endocrinology Evaluation: Type 1 Diabetes Mellitus     Date of Initial Endocrinology Visit: 12/18/2021    PATIENT IDENTIFIER: [name] is a 82 y.o. female with a past medical history of DM, CAD and OSA. The patient presented for initial endocrinology clinic visit on 12/18/2021  for consultative assistance with her diabetes management.    HPI: [name] was    Diagnosed with DM 1986 Prior Medications tried/Intolerance: Metformin- Vomiting  Hemoglobin A1c has ranged from 6.9% in 2009, peaking at 9.5% in 2023.   Used to follow-up with Dr. Talmage Nap   On her initial visit to our clinic her A1c was 6.9%, she was taking Humalog Mix 3 times daily, this was adjusted to twice daily    SUBJECTIVE:   During the last visit (03/30/2022): A1c 6.6%    Today (10/05/22): [name] is here for a follow up on diabetes management. She checks her blood sugars 3-4 times daily through the freestyle libre, did not bring the receiver again today. The pt endorses hypoglycemia , she is symptoms with these symptomatic   Patient presented to the ED 09/09/2022 for with altered mental status, she was diagnosed with hypoxia secondary to possible aspiration pneumonitis.  EEG showed moderate diffuse encephalopathy.  CT head showed no evidence of acute findings explanation of her symptoms.  CXR showed small patchy densities in the left lower lung   She follows with pulmonology for COPD, she continues with shortness of breath and cough      HOME DIABETES REGIMEN: Humalog mix 6 units With breakfast,  and 6 units with Supper       METER DOWNLOAD SUMMARY: n/a     DIABETIC COMPLICATIONS: Microvascular complications:   Denies: CKD, retinopathy Last eye exam: Completed 11/2021  Macrovascular complications:  CAD Denies:  PVD, CVA   PAST HISTORY: Past Medical  History:  Past Medical History:  Diagnosis Date   Adenomatous colon polyp    Allergy    Anxiety    Arthritis    Asthma    Carotid artery disease (HCC)    carotid US 02/2017: bilat ICA 1-39%   Chronic diastolic CHF    Echo 02/2017: EF 65-70, Gr 2 DD, mild MS (mean 5), PASP 44   COPD    pt is unsure if has been officially diagnosed   Coronary artery disease    CABG '09- cathed 12/09, 9/10, 6/11, 3/14 and 12/13/16- medical Rx // cath 12/2016 - 2/3 grafts patent >> med Rx // Myoview 12/17: low risk   Diabetes mellitus    Dyspnea    with exertion   Gastroesophageal reflux disease    Headache    Hiatal hernia    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   Hyperlipidemia    Hypertension    Myocardial infarction (HCC)    PONV (postoperative nausea and vomiting)    Schatzki's ring    Shoulder injury    resolved after shoulder surgery   Sleep apnea    not on cpap   Past Surgical History:  Past Surgical History:  Procedure Laterality Date   ABDOMINAL HYSTERECTOMY     APPENDECTOMY     came out with Hysterectomy   CARDIAC CATHETERIZATION  07/23/2009   EF 60%   CARDIAC CATHETERIZATION  10/11/2008   CARDIAC CATHETERIZATION  03/01/2007   EF 75-80%   CARDIOVASCULAR STRESS TEST  11/15/2007   EF 60%   COLONOSCOPY     CORONARY ARTERY BYPASS GRAFT     SEVERELY DISEASED SAPHENOUS VEIN GRAFT TO THE RIGHT CORONARY ARTERY BUT WITH FAIRLY WELL PRESERVED FLOW TO THE DISTAL RIGHT CORONARY ARTERY FROM THE NATIVE CIRCULATION-RESTART  CATH IN JUNE 2000, REVEALS MILD/MODERATE  CAD WITH GOOD FLOW DOWN HER LAD   ESOPHAGOGASTRODUODENOSCOPY     EYE SURGERY     bilateral cataract surgery with lens implant   LEFT HEART CATH AND CORS/GRAFTS ANGIOGRAPHY N/A 12/14/2016   Procedure: LEFT HEART CATH AND CORS/GRAFTS ANGIOGRAPHY;  Surgeon: Corky Crafts, MD;  Location: [facility];  Service: Cardiovascular;  Laterality: N/A;   lense removal Left    POLYPECTOMY     REVERSE SHOULDER ARTHROPLASTY  Left 06/25/2022   Procedure: REVERSE SHOULDER ARTHROPLASTY;  Surgeon: Beverely Low, MD;  Location: [facility];  Service: Orthopedics;  Laterality: Left;  general and choice with interscalene block   RIGHT/LEFT HEART CATH AND CORONARY/GRAFT ANGIOGRAPHY N/A 07/28/2021   Procedure: RIGHT/LEFT HEART CATH AND CORONARY/GRAFT ANGIOGRAPHY;  Surgeon: Lennette Bihari, MD;  Location: [facility];  Service: Cardiovascular;  Laterality: N/A;   ROTATOR CUFF REPAIR     right and left   TONSILLECTOMY     age 41   TOTAL KNEE ARTHROPLASTY Right 02/20/2014   Procedure: RIGHT TOTAL KNEE ARTHROPLASTY;  Surgeon: Jacki Cones, MD;  Location: [facility];  Service: Orthopedics;  Laterality: Right;   tumor removed kidney     UPPER GASTROINTESTINAL ENDOSCOPY     US ECHOCARDIOGRAPHY  03/08/2008   EF 55-60%    Social History:  reports that she quit smoking about 46 years ago. Her smoking use included cigarettes. She started smoking about 67 years ago. She has a 10.5 pack-year smoking history. She has never used smokeless tobacco. She reports that she does not drink alcohol and does not use drugs. Family History:  Family History  Problem Relation Age of Onset   Heart disease Maternal Grandfather    Heart failure Maternal Grandfather    Diabetes Maternal Grandfather    Heart attack Father    Diabetes Mother    Rheum arthritis Sister    Emphysema Paternal Uncle    Esophageal cancer Brother 32       she said he was born with it   Emphysema Paternal Aunt    Healthy Child    Neuropathy Neg Hx    Multiple sclerosis Neg Hx    Colon cancer Neg Hx    Colon polyps Neg Hx    Rectal cancer Neg Hx    Stomach cancer Neg Hx      HOME MEDICATIONS: Allergies as of 10/05/2022       Reactions   Amlodipine Other (See Comments)   hallucinations    Banana Nausea And Vomiting   Stomach pumped   Co Q10 [coenzyme Q10] Other (See Comments)   Body cramps   Codeine Other (See Comments)   Hallucinate, loose identity and don't  know who I am   Insulin Aspart Other (See Comments)   Unknown   Lisinopril Other (See Comments)   nose bleed   Metformin And Related Nausea And Vomiting   Morphine Other (See Comments)   Can not function, it immobilizes me    Pentazocine Nausea And Vomiting   Pravastatin Other (See Comments)   Hands locked up   Repatha [evolocumab] Other (See Comments)  myalgias   Statins Other (See Comments)   Muscle cramps   Sulfa Antibiotics Swelling   Sulfonamide Derivatives Swelling   Tramadol Hcl Other (See Comments)   Dizziness, cant function         Medication List        Accurate as of October 05, 2022 11:09 AM. If you have any questions, ask your nurse or doctor.          acetaminophen 325 MG tablet Commonly known as: TYLENOL Take 650 mg by mouth every 6 (six) hours as needed for moderate pain or headache.   albuterol 108 (90 Base) MCG/ACT inhaler Commonly known as: VENTOLIN HFA Inhale 2 puffs into the lungs every 6 (six) hours as needed. For shortness of breath. What changed: Another medication with the same name was changed. Make sure you understand how and when to take each.   albuterol (2.5 MG/3ML) 0.083% nebulizer solution Commonly known as: PROVENTIL USE 1 VIAL IN NEBULIZER 4 TIMES DAILY What changed: See the new instructions.   aspirin EC 81 MG tablet Take 1 tablet (81 mg total) by mouth daily.   baclofen 20 MG tablet Commonly known as: LIORESAL TAKE 2 TABLETS BY MOUTH TWICE A DAY   calcium carbonate 500 MG chewable tablet Commonly known as: TUMS - dosed in mg elemental calcium Chew 1 tablet by mouth daily as needed for indigestion or heartburn.   diltiazem 240 MG 24 hr capsule Commonly known as: CARDIZEM CD Take 1 capsule (240 mg total) by mouth daily.   divalproex 250 MG 24 hr tablet Commonly known as: DEPAKOTE ER TAKE 1 TABLET (250 MG TOTAL) BY MOUTH AT BEDTIME.   fluticasone 50 MCG/ACT nasal spray Commonly known as: FLONASE SPRAY 2 SPRAYS INTO  EACH NOSTRIL EVERY DAY What changed: See the new instructions.   FreeStyle Libre 2 Reader Devi 1 Device by Does not apply route every 14 (fourteen) days.   FreeStyle Libre 2 Sensor Misc 1 Device by Does not apply route every 14 (fourteen) days.   glucose blood test strip Use as instructed to test blood sugars up to 4 times daily   insulin lispro protamine-lispro (75-25) 100 UNIT/ML Susp injection Commonly known as: HUMALOG 75/25 MIX Inject 18 Units into the skin 2 (two) times daily with a meal. 10 units at lunch depending on bs reading What changed:  how much to take when to take this additional instructions   INSULIN SYRINGE .3CC/29GX1" 29G X 1" 0.3 ML Misc 1 Device by Does not apply route in the morning and at bedtime.   isosorbide mononitrate 60 MG 24 hr tablet Commonly known as: IMDUR Take one tablet ( 60 mg ) twice daily 8 hours apart.   meclizine 25 MG tablet Commonly known as: ANTIVERT Take 25 mg by mouth 2 (two) times daily as needed for dizziness.   meloxicam 15 MG tablet Commonly known as: MOBIC Take 15 mg by mouth daily.   metoprolol succinate 100 MG 24 hr tablet Commonly known as: TOPROL-XL TAKE 1 TABLET BY MOUTH EVERY DAY   multivitamin with minerals Tabs tablet Take 1 tablet by mouth daily.   neomycin-polymyxin b-dexamethasone 3.5-10000-0.1 Susp Commonly known as: MAXITROL SMARTSIG:In Eye(s)   nitroGLYCERIN 0.4 MG SL tablet Commonly known as: NITROSTAT Place 1 tablet (0.4 mg total) under the tongue every 5 (five) minutes as needed for chest pain.   ondansetron 4 MG tablet Commonly known as: Zofran Take 1 tablet (4 mg total) by mouth every 8 (eight) hours as  needed for refractory nausea / vomiting, vomiting or nausea.   spironolactone 25 MG tablet Commonly known as: ALDACTONE TAKE 1 TABLET BY MOUTH EVERY DAY   temazepam 30 MG capsule Commonly known as: RESTORIL Take 1 capsule (30 mg total) by mouth daily. What changed: when to take this    Trelegy Ellipta 200-62.5-25 MCG/ACT Aepb Generic drug: Fluticasone-Umeclidin-Vilant Inhale 1 puff into the lungs daily at 6 (six) AM.   VITAMIN B-12 IJ Inject 1,000 mcg as directed every 30 (thirty) days.   Vitamin D 50 MCG (2000 UT) tablet Take 4,000 Units by mouth daily.         ALLERGIES: Allergies  Allergen Reactions   Amlodipine Other (See Comments)    hallucinations    Banana Nausea And Vomiting    Stomach pumped   Co Q10 [Coenzyme Q10] Other (See Comments)    Body cramps   Codeine Other (See Comments)    Hallucinate, loose identity and don't know who I am   Insulin Aspart Other (See Comments)    Unknown   Lisinopril Other (See Comments)    nose bleed   Metformin And Related Nausea And Vomiting   Morphine Other (See Comments)    Can not function, it immobilizes me    Pentazocine Nausea And Vomiting   Pravastatin Other (See Comments)    Hands locked up   Repatha [Evolocumab] Other (See Comments)    myalgias   Statins Other (See Comments)    Muscle cramps   Sulfa Antibiotics Swelling   Sulfonamide Derivatives Swelling   Tramadol Hcl Other (See Comments)    Dizziness, cant function      REVIEW OF SYSTEMS: A comprehensive ROS was conducted with the patient and is negative except as per HPI     OBJECTIVE:   VITAL SIGNS: BP 126/82 (BP Location: Left Arm, Patient Position: Sitting, Cuff Size: Small)   Pulse 70   Ht 5\' 2"  (1.575 m)   Wt 164 lb (74.4 kg)   LMP  (LMP Unknown)   BMI 30.00 kg/m    PHYSICAL EXAM:  General: Pt appears well and is in NAD  Neck: General: Supple without adenopathy or carotid bruits. Thyroid: Thyroid size normal.  No goiter or nodules appreciated.   Lungs: Clear with good BS bilat   Heart: RRR   Extremities:  Lower extremities - No pretibial edema.  Pt with tenderness at the right 2/3 MTH   Neuro: MS is good with appropriate affect, pt is alert and Ox3    Dm Foot Exam  10/05/2022   The skin of the feet is intact  without sores or ulcerations. The pedal pulses are 2+ on right and 2+ on left. The sensation is intact to a screening 5.07, 10 gram monofilament bilaterally   DATA REVIEWED:  Lab Results  Component Value Date   HGBA1C 6.6 (H) 06/09/2022   HGBA1C 6.5 (H) 03/31/2022   HGBA1C 6.6 (A) 03/30/2022    Latest Reference Range & Units 09/23/22 13:29  Sodium 135 - 145 mEq/L 135  Potassium 3.5 - 5.1 mEq/L 4.3  Chloride 96 - 112 mEq/L 98  CO2 19 - 32 mEq/L 30  Glucose 70 - 99 mg/dL 454 (H)  BUN 6 - 23 mg/dL 27 (H)  Creatinine 0.98 - 1.20 mg/dL 1.19  Calcium 8.4 - 14.7 mg/dL 82.9  Magnesium 1.5 - 2.5 mg/dL 1.9    Latest Reference Range & Units 09/23/22 13:29  GFR >60.00 mL/min 62.23       Old  records , labs and images have been reviewed.   ASSESSMENT / PLAN / RECOMMENDATIONS:   1) Type 1 Diabetes Mellitus, Optimaly controlled, With macrovascular complications - Most recent A1c of 7.3 %. Goal A1c < 7.5 %.     -Patient with history of recurrent severe hypoglycemic episodes, with a prior A1c of 6.5%, currently at 7.3%.  I suspect she is having less hypoglycemic episodes than prior -The patient continues to endorse glycemic excursions, she did not bring her freestyle libre receiver again today, and we were unable to download any glucose information -I did explain to the patient the importance of having glucose data available to me so I can make the appropriate adjustments to her insulin -Her fasting BG in office 179 mg/DL, patient is not happy with this because it is high, I did explain to the patient that while her history of hypoglycemia I prefer her BG's to be on the higher side  -Patient would like to have her glucose at a more stable weight, I did explain to the patient that it is difficult to keep glucose stable with the insulin mix, as it lacks flexibility, I gave her the option to switch to basal/prandial insulin for better control of her glucose, but she would like to remain on the  insulin mix as it is easier to take -Patient may drop off her freestyle Surveyor, mining for downloading -No changes at this time unless more glucose data is available -During hypoglycemic episodes, the patient would eat something mixed with a protein, we did review the rule of 15 for proper correction of hypoglycemia to prevent overcorrection, as it appears the patient does tend to overcorrect  MEDICATIONS: Continue Humalog mix 6 units before breakfast and 6 units before supper  EDUCATION / INSTRUCTIONS: BG monitoring instructions: Patient is instructed to check her blood sugars 3 times a day, before each meal. Call [place] [facility] if: BG persistently < 70  I reviewed the Rule of 15 for the treatment of hypoglycemia in detail with the patient. Literature supplied.   2) Diabetic complications:  Eye: Does not have known diabetic retinopathy.  Neuro/ Feet: Does not have known diabetic peripheral neuropathy. Renal: Patient does not have known baseline CKD. She is not on an ACEI/ARB at present.  3) Right Foot Pain :  -Patient with tenderness at the right  forefoot -Counseled about avoiding heels, applying ice and taking Tylenol as needed -I have offered to refer her to podiatry  Follow-up in 6 months   I spent 25 minutes preparing to see the patient by review of recent labs, imaging and procedures, obtaining and reviewing separately obtained history, communicating with the patient ordering medications, tests or procedures, and documenting clinical information in the EHR including the differential Dx, treatment, and any further evaluation and other management     Signed electronically by: Lyndle Herrlich, MD  [facility]  [facility] [address] [place] [place], [place] [address] Phone: [phone] FAX: [phone]   CC: Pincus Sanes, MD [address] [place] [address] Phone: [phone]  Fax:  [phone]    Return to Endocrinology clinic as below: Future Appointments  Date Time Provider Department Center  10/05/2022 11:10 AM Meriel Kelliher, Konrad Dolores, MD [facility] None  10/06/2022  1:20 PM Pincus Sanes, MD [facility] None  10/07/2022  9:30 AM [facility]-ANNUAL WELLNESS VISIT [facility] None  11/22/2022 10:50 AM Drema Dallas, DO [facility] None  12/21/2022 11:00 AM [facility] RESEARCH [facility]  1 [facility] None  03/29/2023 11:00 AM Burns, Bobette Mo, MD [facility] None

## 2022-10-05 NOTE — Progress Notes (Deleted)
Subjective:    Patient ID: [name], female    DOB: 1940-07-25, 82 y.o.   MRN: [id number]     HPI [name] is here for follow up of her chronic medical problems.    Medications and allergies reviewed with patient and updated if appropriate.  Current Outpatient Medications on File Prior to Visit  Medication Sig Dispense Refill   acetaminophen (TYLENOL) 325 MG tablet Take 650 mg by mouth every 6 (six) hours as needed for moderate pain or headache.     albuterol (PROVENTIL HFA;VENTOLIN HFA) 108 (90 BASE) MCG/ACT inhaler Inhale 2 puffs into the lungs every 6 (six) hours as needed. For shortness of breath. 1 Inhaler 0   albuterol (PROVENTIL) (2.5 MG/3ML) 0.083% nebulizer solution USE 1 VIAL IN NEBULIZER 4 TIMES DAILY (Patient taking differently: Take 2.5 mg by nebulization every 6 (six) hours as needed for wheezing or shortness of breath.) 360 mL 1   aspirin EC 81 MG tablet Take 1 tablet (81 mg total) by mouth daily.     baclofen (LIORESAL) 20 MG tablet TAKE 2 TABLETS BY MOUTH TWICE A DAY 360 tablet 2   calcium carbonate (TUMS - DOSED IN MG ELEMENTAL CALCIUM) 500 MG chewable tablet Chew 1 tablet by mouth daily as needed for indigestion or heartburn.     Cholecalciferol (VITAMIN D) 50 MCG (2000 UT) tablet Take 4,000 Units by mouth daily.     Continuous Blood Gluc Receiver (FREESTYLE LIBRE 2 READER) DEVI 1 Device by Does not apply route every 14 (fourteen) days. 1 each 3   Continuous Blood Gluc Sensor (FREESTYLE LIBRE 2 SENSOR) MISC 1 Device by Does not apply route every 14 (fourteen) days. 6 each 3   Cyanocobalamin (VITAMIN B-12 IJ) Inject 1,000 mcg as directed every 30 (thirty) days.     diltiazem (CARDIZEM CD) 240 MG 24 hr capsule Take 1 capsule (240 mg total) by mouth daily. 90 capsule 2   divalproex (DEPAKOTE ER) 250 MG 24 hr tablet TAKE 1 TABLET (250 MG TOTAL) BY MOUTH AT BEDTIME. 90 tablet 1   fluticasone (FLONASE) 50 MCG/ACT nasal spray SPRAY 2 SPRAYS INTO EACH NOSTRIL EVERY DAY  (Patient taking differently: Place 2 sprays into both nostrils daily as needed for allergies.) 48 mL 3   Fluticasone-Umeclidin-Vilant (TRELEGY ELLIPTA) 200-62.5-25 MCG/ACT AEPB Inhale 1 puff into the lungs daily at 6 (six) AM. 1 each 0   glucose blood test strip Use as instructed to test blood sugars up to 4 times daily 200 each 12   insulin lispro protamine-lispro (HUMALOG 75/25 MIX) (75-25) 100 UNIT/ML SUSP injection Inject 18 Units into the skin 2 (two) times daily with a meal. 10 units at lunch depending on bs reading (Patient taking differently: Inject 6 Units into the skin See admin instructions. Inject 6  units under the skin at breakfast and 6 units in the evening  PT does not takes if has not eaten) 40 mL 3   Insulin Syringe-Needle U-100 (INSULIN SYRINGE .3CC/29GX1") 29G X 1" 0.3 ML MISC 1 Device by Does not apply route in the morning and at bedtime. 200 each 3   isosorbide mononitrate (IMDUR) 60 MG 24 hr tablet Take one tablet ( 60 mg ) twice daily 8 hours apart. 180 tablet 3   meclizine (ANTIVERT) 25 MG tablet Take 25 mg by mouth 2 (two) times daily as needed for dizziness.     meloxicam (MOBIC) 15 MG tablet Take 15 mg by mouth daily.  metoprolol succinate (TOPROL-XL) 100 MG 24 hr tablet TAKE 1 TABLET BY MOUTH EVERY DAY 90 tablet 3   Multiple Vitamin (MULTIVITAMIN WITH MINERALS) TABS Take 1 tablet by mouth daily.     neomycin-polymyxin b-dexamethasone (MAXITROL) 3.5-10000-0.1 SUSP SMARTSIG:In Eye(s)     nitroGLYCERIN (NITROSTAT) 0.4 MG SL tablet Place 1 tablet (0.4 mg total) under the tongue every 5 (five) minutes as needed for chest pain. 25 tablet 3   ondansetron (ZOFRAN) 4 MG tablet Take 1 tablet (4 mg total) by mouth every 8 (eight) hours as needed for refractory nausea / vomiting, vomiting or nausea. 20 tablet 1   spironolactone (ALDACTONE) 25 MG tablet TAKE 1 TABLET BY MOUTH EVERY DAY 90 tablet 3   temazepam (RESTORIL) 30 MG capsule Take 1 capsule (30 mg total) by mouth daily.  (Patient taking differently: Take 30 mg by mouth at bedtime.) 30 capsule 0   Current Facility-Administered Medications on File Prior to Visit  Medication Dose Route Frequency Provider Last Rate Last Admin   Study - ORION 4 - inclisiran 300 mg/1.57mL or placebo SQ injection (PI-Stuckey)  300 mg Subcutaneous Q6 months Herby Abraham, MD   300 mg at 06/22/22 1015     Review of Systems     Objective:  There were no vitals filed for this visit. BP Readings from Last 3 Encounters:  10/05/22 126/82  09/21/22 122/80  09/10/22 132/69   Wt Readings from Last 3 Encounters:  10/05/22 164 lb (74.4 kg)  09/21/22 164 lb (74.4 kg)  09/09/22 163 lb 2.3 oz (74 kg)   There is no height or weight on file to calculate BMI.    Physical Exam     Lab Results  Component Value Date   WBC 5.6 09/23/2022   HGB 12.6 09/23/2022   HCT 40.7 09/23/2022   PLT 257.0 09/23/2022   GLUCOSE 202 (H) 09/23/2022   CHOL 108 10/21/2020   TRIG 125 10/21/2020   HDL 27 (L) 10/21/2020   LDLCALC 58 10/21/2020   ALT 11 09/10/2022   AST 15 09/10/2022   NA 135 09/23/2022   K 4.3 09/23/2022   CL 98 09/23/2022   CREATININE 0.86 09/23/2022   BUN 27 (H) 09/23/2022   CO2 30 09/23/2022   TSH 0.718 09/09/2022   INR 1.1 07/28/2021   HGBA1C 7.3 (A) 10/05/2022   MICROALBUR 1.6 09/23/2022     Assessment & Plan:    See Problem List for Assessment and Plan of chronic medical problems.

## 2022-10-05 NOTE — Patient Instructions (Addendum)
Continue  Humalog Mix 6 units With Breakfast and 6 units with Supper      HOW TO TREAT LOW BLOOD SUGARS (Blood sugar LESS THAN 70 MG/DL) Please follow the RULE OF 15 for the treatment of hypoglycemia treatment (when your (blood sugars are less than 70 mg/dL)   STEP 1: Take 15 grams of carbohydrates when your blood sugar is low, which includes:  3-4 GLUCOSE TABS  OR 3-4 OZ OF JUICE OR REGULAR SODA OR ONE TUBE OF GLUCOSE GEL    STEP 2: RECHECK blood sugar in 15 MINUTES STEP 3: If your blood sugar is still low at the 15 minute recheck --> then, go back to STEP 1 and treat AGAIN with another 15 grams of carbohydrates.

## 2022-10-06 ENCOUNTER — Ambulatory Visit: Payer: Medicare Other | Admitting: Internal Medicine

## 2022-10-07 ENCOUNTER — Ambulatory Visit: Payer: Medicare Other | Admitting: Internal Medicine

## 2022-10-19 ENCOUNTER — Ambulatory Visit (INDEPENDENT_AMBULATORY_CARE_PROVIDER_SITE_OTHER): Payer: Medicare Other | Admitting: Internal Medicine

## 2022-10-19 ENCOUNTER — Encounter: Payer: Self-pay | Admitting: Internal Medicine

## 2022-10-19 VITALS — BP 126/74 | HR 96 | Temp 98.6°F | Ht 62.0 in | Wt 163.6 lb

## 2022-10-19 DIAGNOSIS — I10 Essential (primary) hypertension: Secondary | ICD-10-CM

## 2022-10-19 DIAGNOSIS — R059 Cough, unspecified: Secondary | ICD-10-CM

## 2022-10-19 DIAGNOSIS — J440 Chronic obstructive pulmonary disease with acute lower respiratory infection: Secondary | ICD-10-CM | POA: Diagnosis not present

## 2022-10-19 DIAGNOSIS — J209 Acute bronchitis, unspecified: Secondary | ICD-10-CM

## 2022-10-19 DIAGNOSIS — E1059 Type 1 diabetes mellitus with other circulatory complications: Secondary | ICD-10-CM | POA: Diagnosis not present

## 2022-10-19 LAB — POC COVID19 BINAXNOW: SARS Coronavirus 2 Ag: NEGATIVE

## 2022-10-19 LAB — POCT INFLUENZA A/B
Influenza A, POC: NEGATIVE
Influenza B, POC: NEGATIVE

## 2022-10-19 MED ORDER — ALBUTEROL SULFATE (2.5 MG/3ML) 0.083% IN NEBU: 2.5000 mg | INHALATION_SOLUTION | Freq: Four times a day (QID) | RESPIRATORY_TRACT | 5 refills | Status: AC | PRN

## 2022-10-19 MED ORDER — METHYLPREDNISOLONE ACETATE 80 MG/ML IJ SUSP
80.0000 mg | Freq: Once | INTRAMUSCULAR | Status: AC
Start: 2022-10-19 — End: 2022-10-19
  Administered 2022-10-19: 80 mg via INTRAMUSCULAR

## 2022-10-19 MED ORDER — AMOXICILLIN-POT CLAVULANATE 875-125 MG PO TABS: 1.0000 | ORAL_TABLET | Freq: Two times a day (BID) | ORAL | 0 refills | Status: DC

## 2022-10-19 MED ORDER — PREDNISONE 20 MG PO TABS: 40.0000 mg | ORAL_TABLET | Freq: Every day | ORAL | 0 refills | Status: AC

## 2022-10-19 MED ORDER — BENZONATATE 200 MG PO CAPS: 200.0000 mg | ORAL_CAPSULE | Freq: Three times a day (TID) | ORAL | 0 refills | Status: DC | PRN

## 2022-10-19 NOTE — Patient Instructions (Addendum)
    You received a steroid injection today    Medications changes include :   augmentin twice daily for 10 days,   continue your inhalers.  Prednisone 40 mg daily for 5 days if needed only.   Tessalon perles for cough       Return if symptoms worsen or fail to improve.

## 2022-10-19 NOTE — Assessment & Plan Note (Signed)
Chronic Blood pressure well controlled Continue diltiazem 240 mg daily, isosorbide mononitrate 60 mg twice daily, metoprolol XL 100 mg daily, spironolactone 25 mg daily

## 2022-10-19 NOTE — Progress Notes (Signed)
Subjective:    Patient ID: [name], female    DOB: [birth year], 82 y.o.   MRN: [id number]      HPI [name] is here for  Chief Complaint  Patient presents with   Cough    Since last Thursday. Phlegm is yellow and green. Having breathing issues.     She is here for an acute visit for cold symptoms.   Her symptoms started 5 days ago  She is experiencing fatigue, runny nose, sore throat, chest tightness, wheezing, cough that is productive of discolored sputum, shortness of breath and chest pain related to the cough.  She has been experiencing headaches.  She has some lightheadedness and dizziness, but nothing more than usual.  She does state altered taste and smell.  She has not had any fevers.  She has tried taking tylenol, albuterol inhaler, trelegy.       Medications and allergies reviewed with patient and updated if appropriate.  Current Outpatient Medications on File Prior to Visit  Medication Sig Dispense Refill   acetaminophen (TYLENOL) 325 MG tablet Take 650 mg by mouth every 6 (six) hours as needed for moderate pain or headache.     albuterol (PROVENTIL HFA;VENTOLIN HFA) 108 (90 BASE) MCG/ACT inhaler Inhale 2 puffs into the lungs every 6 (six) hours as needed. For shortness of breath. 1 Inhaler 0   albuterol (PROVENTIL) (2.5 MG/3ML) 0.083% nebulizer solution USE 1 VIAL IN NEBULIZER 4 TIMES DAILY (Patient taking differently: Take 2.5 mg by nebulization every 6 (six) hours as needed for wheezing or shortness of breath.) 360 mL 1   aspirin EC 81 MG tablet Take 1 tablet (81 mg total) by mouth daily.     baclofen (LIORESAL) 20 MG tablet TAKE 2 TABLETS BY MOUTH TWICE A DAY 360 tablet 2   calcium carbonate (TUMS - DOSED IN MG ELEMENTAL CALCIUM) 500 MG chewable tablet Chew 1 tablet by mouth daily as needed for indigestion or heartburn.     Cholecalciferol (VITAMIN D) 50 MCG (2000 UT) tablet Take 4,000 Units by mouth daily.     Continuous Blood Gluc Receiver (FREESTYLE LIBRE 2  READER) DEVI 1 Device by Does not apply route every 14 (fourteen) days. 1 each 3   Continuous Blood Gluc Sensor (FREESTYLE LIBRE 2 SENSOR) MISC 1 Device by Does not apply route every 14 (fourteen) days. 6 each 3   Cyanocobalamin (VITAMIN B-12 IJ) Inject 1,000 mcg as directed every 30 (thirty) days.     diltiazem (CARDIZEM CD) 240 MG 24 hr capsule Take 1 capsule (240 mg total) by mouth daily. 90 capsule 2   divalproex (DEPAKOTE ER) 250 MG 24 hr tablet TAKE 1 TABLET (250 MG TOTAL) BY MOUTH AT BEDTIME. 90 tablet 1   fluticasone (FLONASE) 50 MCG/ACT nasal spray SPRAY 2 SPRAYS INTO EACH NOSTRIL EVERY DAY (Patient taking differently: Place 2 sprays into both nostrils daily as needed for allergies.) 48 mL 3   Fluticasone-Umeclidin-Vilant (TRELEGY ELLIPTA) 200-62.5-25 MCG/ACT AEPB Inhale 1 puff into the lungs daily at 6 (six) AM. 1 each 0   glucose blood test strip Use as instructed to test blood sugars up to 4 times daily 200 each 12   insulin lispro protamine-lispro (HUMALOG 75/25 MIX) (75-25) 100 UNIT/ML SUSP injection Inject 18 Units into the skin 2 (two) times daily with a meal. 10 units at lunch depending on bs reading (Patient taking differently: Inject 6 Units into the skin See admin instructions. Inject 6  units under the skin  at breakfast and 6 units in the evening  PT does not takes if has not eaten) 40 mL 3   Insulin Syringe-Needle U-100 (INSULIN SYRINGE .3CC/29GX1") 29G X 1" 0.3 ML MISC 1 Device by Does not apply route in the morning and at bedtime. 200 each 3   isosorbide mononitrate (IMDUR) 60 MG 24 hr tablet Take one tablet ( 60 mg ) twice daily 8 hours apart. 180 tablet 3   meclizine (ANTIVERT) 25 MG tablet Take 25 mg by mouth 2 (two) times daily as needed for dizziness.     meloxicam (MOBIC) 15 MG tablet Take 15 mg by mouth daily.     metoprolol succinate (TOPROL-XL) 100 MG 24 hr tablet TAKE 1 TABLET BY MOUTH EVERY DAY 90 tablet 3   Multiple Vitamin (MULTIVITAMIN WITH MINERALS) TABS Take 1  tablet by mouth daily.     neomycin-polymyxin b-dexamethasone (MAXITROL) 3.5-10000-0.1 SUSP SMARTSIG:In Eye(s)     nitroGLYCERIN (NITROSTAT) 0.4 MG SL tablet Place 1 tablet (0.4 mg total) under the tongue every 5 (five) minutes as needed for chest pain. 25 tablet 3   ondansetron (ZOFRAN) 4 MG tablet Take 1 tablet (4 mg total) by mouth every 8 (eight) hours as needed for refractory nausea / vomiting, vomiting or nausea. 20 tablet 1   spironolactone (ALDACTONE) 25 MG tablet TAKE 1 TABLET BY MOUTH EVERY DAY 90 tablet 3   temazepam (RESTORIL) 30 MG capsule Take 1 capsule (30 mg total) by mouth daily. (Patient taking differently: Take 30 mg by mouth at bedtime.) 30 capsule 0   Current Facility-Administered Medications on File Prior to Visit  Medication Dose Route Frequency Provider Last Rate Last Admin   Study - ORION 4 - inclisiran 300 mg/1.69mL or placebo SQ injection (PI-Stuckey)  300 mg Subcutaneous Q6 months Herby Abraham, MD   300 mg at 06/22/22 1015    Review of Systems  Constitutional:  Positive for fatigue. Negative for fever.  HENT:  Positive for rhinorrhea and sore throat. Negative for congestion and ear pain (ears clogged).        Can not smell and taste is off  Respiratory:  Positive for cough (productive - yellow-green sputum), chest tightness, shortness of breath and wheezing.   Cardiovascular:  Positive for chest pain.  Gastrointestinal:  Negative for diarrhea and nausea.  Musculoskeletal:  Negative for myalgias.  Neurological:  Positive for dizziness (chronic- no change), light-headedness (chronic- no change) and headaches.       Objective:   Vitals:   10/19/22 1602  BP: 126/74  Pulse: 96  Temp: 98.6 F (37 C)   BP Readings from Last 3 Encounters:  10/19/22 126/74  10/05/22 126/82  09/21/22 122/80   Wt Readings from Last 3 Encounters:  10/19/22 163 lb 9.6 oz (74.2 kg)  10/05/22 164 lb (74.4 kg)  09/21/22 164 lb (74.4 kg)   Body mass index is 29.92 kg/m.     Physical Exam Constitutional:      General: She is not in acute distress.    Appearance: Normal appearance. She is not ill-appearing.  HENT:     Head: Normocephalic and atraumatic.     Right Ear: Tympanic membrane, ear canal and external ear normal.     Left Ear: Tympanic membrane, ear canal and external ear normal.     Mouth/Throat:     Mouth: Mucous membranes are moist.     Pharynx: No oropharyngeal exudate or posterior oropharyngeal erythema.  Eyes:     Conjunctiva/sclera: Conjunctivae normal.  Cardiovascular:     Rate and Rhythm: Normal rate and regular rhythm.  Pulmonary:     Effort: Pulmonary effort is normal. No respiratory distress.     Breath sounds: Normal breath sounds. No wheezing or rales.     Comments: Mildly coarse breath sounds diffusely Musculoskeletal:     Cervical back: Neck supple. No tenderness.  Lymphadenopathy:     Cervical: No cervical adenopathy.  Skin:    General: Skin is warm and dry.  Neurological:     Mental Status: She is alert.            Assessment & Plan:    See Problem List for Assessment and Plan of chronic medical problems.

## 2022-10-19 NOTE — Assessment & Plan Note (Signed)
Acute Acute bronchitis with asthma/COPD exacerbation COVID and flu tests negative here today Concern for bacterial cause Start Augmentin 875-125 mg twice daily Tessalon Perles Depo-Medrol 80 mg IM x 1 Prednisone 40 mg daily x 5 days prescription given-she will only take this if she needs it Albuterol nebulizer solution sent to pharmacy-use every 6 hours Trelegy daily She will call if there is no improvement Continue Trelegy daily

## 2022-10-19 NOTE — Assessment & Plan Note (Signed)
Chronic Lab Results  Component Value Date   HGBA1C 7.3 (A) 10/05/2022   Unfortunately she does require steroids for her current illness-sugars reasonably controlled think benefits outweigh the risks Following with endocrine

## 2022-10-21 ENCOUNTER — Other Ambulatory Visit: Payer: Self-pay | Admitting: Neurology

## 2022-10-21 ENCOUNTER — Other Ambulatory Visit: Payer: Self-pay | Admitting: Cardiovascular Disease

## 2022-10-27 ENCOUNTER — Telehealth: Payer: Self-pay | Admitting: Internal Medicine

## 2022-10-27 NOTE — Telephone Encounter (Signed)
Called and left message for patient.  She needs to be seen.  If she calls back please schedule.

## 2022-10-27 NOTE — Telephone Encounter (Signed)
Pt called stating she is still feeling bad with bad nausea and spiking headaches. Pt also stated she has no energy. Please advise.

## 2022-11-07 ENCOUNTER — Encounter: Payer: Self-pay | Admitting: Internal Medicine

## 2022-11-07 NOTE — Progress Notes (Unsigned)
Subjective:    Patient ID: [name], female    DOB: 12/02/1940, 82 y.o.   MRN: [id number]      HPI [name] is here for No chief complaint on file.   Still with nausea, headaches and no appetite.    Headaches - tension like headache - saw Dr Everlena Cooper 01/2022.  Was started on divalproex.     Has not seen GI    Medications and allergies reviewed with patient and updated if appropriate.  Current Outpatient Medications on File Prior to Visit  Medication Sig Dispense Refill   acetaminophen (TYLENOL) 325 MG tablet Take 650 mg by mouth every 6 (six) hours as needed for moderate pain or headache.     albuterol (PROVENTIL HFA;VENTOLIN HFA) 108 (90 BASE) MCG/ACT inhaler Inhale 2 puffs into the lungs every 6 (six) hours as needed. For shortness of breath. 1 Inhaler 0   albuterol (PROVENTIL) (2.5 MG/3ML) 0.083% nebulizer solution Take 3 mLs (2.5 mg total) by nebulization every 6 (six) hours as needed for wheezing or shortness of breath. 360 mL 5   amoxicillin-clavulanate (AUGMENTIN) 875-125 MG tablet Take 1 tablet by mouth 2 (two) times daily. 20 tablet 0   aspirin EC 81 MG tablet Take 1 tablet (81 mg total) by mouth daily.     baclofen (LIORESAL) 20 MG tablet TAKE 2 TABLETS BY MOUTH TWICE A DAY 360 tablet 2   benzonatate (TESSALON) 200 MG capsule Take 1 capsule (200 mg total) by mouth 3 (three) times daily as needed for cough. 90 capsule 0   calcium carbonate (TUMS - DOSED IN MG ELEMENTAL CALCIUM) 500 MG chewable tablet Chew 1 tablet by mouth daily as needed for indigestion or heartburn.     Cholecalciferol (VITAMIN D) 50 MCG (2000 UT) tablet Take 4,000 Units by mouth daily.     Continuous Blood Gluc Receiver (FREESTYLE LIBRE 2 READER) DEVI 1 Device by Does not apply route every 14 (fourteen) days. 1 each 3   Continuous Blood Gluc Sensor (FREESTYLE LIBRE 2 SENSOR) MISC 1 Device by Does not apply route every 14 (fourteen) days. 6 each 3   Cyanocobalamin (VITAMIN B-12 IJ) Inject 1,000 mcg as  directed every 30 (thirty) days.     diltiazem (CARDIZEM CD) 240 MG 24 hr capsule Take 1 capsule (240 mg total) by mouth daily. 90 capsule 2   divalproex (DEPAKOTE ER) 250 MG 24 hr tablet TAKE 1 TABLET (250 MG TOTAL) BY MOUTH AT BEDTIME. 90 tablet 1   fluticasone (FLONASE) 50 MCG/ACT nasal spray SPRAY 2 SPRAYS INTO EACH NOSTRIL EVERY DAY (Patient taking differently: Place 2 sprays into both nostrils daily as needed for allergies.) 48 mL 3   Fluticasone-Umeclidin-Vilant (TRELEGY ELLIPTA) 200-62.5-25 MCG/ACT AEPB Inhale 1 puff into the lungs daily at 6 (six) AM. 1 each 0   glucose blood test strip Use as instructed to test blood sugars up to 4 times daily 200 each 12   insulin lispro protamine-lispro (HUMALOG 75/25 MIX) (75-25) 100 UNIT/ML SUSP injection Inject 18 Units into the skin 2 (two) times daily with a meal. 10 units at lunch depending on bs reading (Patient taking differently: Inject 6 Units into the skin See admin instructions. Inject 6  units under the skin at breakfast and 6 units in the evening  PT does not takes if has not eaten) 40 mL 3   Insulin Syringe-Needle U-100 (INSULIN SYRINGE .3CC/29GX1") 29G X 1" 0.3 ML MISC 1 Device by Does not apply route in the  morning and at bedtime. 200 each 3   isosorbide mononitrate (IMDUR) 60 MG 24 hr tablet Take one tablet ( 60 mg ) twice daily 8 hours apart. 180 tablet 3   meclizine (ANTIVERT) 25 MG tablet Take 25 mg by mouth 2 (two) times daily as needed for dizziness.     meloxicam (MOBIC) 15 MG tablet Take 15 mg by mouth daily.     metoprolol succinate (TOPROL-XL) 100 MG 24 hr tablet TAKE 1 TABLET BY MOUTH EVERY DAY 90 tablet 3   Multiple Vitamin (MULTIVITAMIN WITH MINERALS) TABS Take 1 tablet by mouth daily.     neomycin-polymyxin b-dexamethasone (MAXITROL) 3.5-10000-0.1 SUSP SMARTSIG:In Eye(s)     nitroGLYCERIN (NITROSTAT) 0.4 MG SL tablet Place 1 tablet (0.4 mg total) under the tongue every 5 (five) minutes as needed for chest pain. 25 tablet 3    ondansetron (ZOFRAN) 4 MG tablet Take 1 tablet (4 mg total) by mouth every 8 (eight) hours as needed for refractory nausea / vomiting, vomiting or nausea. 20 tablet 1   spironolactone (ALDACTONE) 25 MG tablet TAKE 1 TABLET BY MOUTH EVERY DAY 90 tablet 0   temazepam (RESTORIL) 30 MG capsule Take 1 capsule (30 mg total) by mouth daily. (Patient taking differently: Take 30 mg by mouth at bedtime.) 30 capsule 0   Current Facility-Administered Medications on File Prior to Visit  Medication Dose Route Frequency Provider Last Rate Last Admin   Study - ORION 4 - inclisiran 300 mg/1.74mL or placebo SQ injection (PI-Stuckey)  300 mg Subcutaneous Q6 months Herby Abraham, MD   300 mg at 06/22/22 1015    Review of Systems     Objective:  There were no vitals filed for this visit. BP Readings from Last 3 Encounters:  10/19/22 126/74  10/05/22 126/82  09/21/22 122/80   Wt Readings from Last 3 Encounters:  10/19/22 163 lb 9.6 oz (74.2 kg)  10/05/22 164 lb (74.4 kg)  09/21/22 164 lb (74.4 kg)   There is no height or weight on file to calculate BMI.    Physical Exam         Assessment & Plan:    See Problem List for Assessment and Plan of chronic medical problems.

## 2022-11-08 ENCOUNTER — Ambulatory Visit (INDEPENDENT_AMBULATORY_CARE_PROVIDER_SITE_OTHER): Payer: Medicare Other | Admitting: Internal Medicine

## 2022-11-08 VITALS — BP 138/78 | HR 74 | Temp 98.6°F | Resp 18 | Ht 62.0 in | Wt 163.0 lb

## 2022-11-08 DIAGNOSIS — R42 Dizziness and giddiness: Secondary | ICD-10-CM | POA: Diagnosis not present

## 2022-11-08 DIAGNOSIS — F419 Anxiety disorder, unspecified: Secondary | ICD-10-CM | POA: Diagnosis not present

## 2022-11-08 DIAGNOSIS — E538 Deficiency of other specified B group vitamins: Secondary | ICD-10-CM | POA: Diagnosis not present

## 2022-11-08 MED ORDER — CYANOCOBALAMIN 1000 MCG/ML IJ SOLN
1000.0000 ug | Freq: Once | INTRAMUSCULAR | Status: AC
Start: 2022-11-08 — End: 2022-11-08
  Administered 2022-11-08: 1000 ug via INTRAMUSCULAR

## 2022-11-08 MED ORDER — METOPROLOL SUCCINATE ER 100 MG PO TB24: 100.0000 mg | ORAL_TABLET | Freq: Every day | ORAL | 3 refills | Status: DC

## 2022-11-08 MED ORDER — SERTRALINE HCL 25 MG PO TABS: 25.0000 mg | ORAL_TABLET | Freq: Every day | ORAL | 5 refills | Status: DC

## 2022-11-08 NOTE — Assessment & Plan Note (Signed)
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 PPPD Has increased anxiety and will start an SSRI which hopefully will help some

## 2022-11-08 NOTE — Patient Instructions (Addendum)
     Medications changes include :   sertraline 25 mg daily for 2 weeks then increase to 50 mg daily    A referral was ordered [facility] in [place] and someone will call you to schedule an appointment.     Return for follow up as scheduled.

## 2022-11-08 NOTE — Assessment & Plan Note (Signed)
Chronic Not controlled Taking restoril 30 mg at night Start sertraline 25 mg daily x 2 weeks then increase to 50 mg daily if well tolerated

## 2022-11-08 NOTE — Assessment & Plan Note (Signed)
Chronic Continue B12 injections monthly-she is not always compliant with getting them on a timely monthly basis B12 injection today

## 2022-11-19 NOTE — Progress Notes (Signed)
NEUROLOGY FOLLOW UP OFFICE NOTE  [name] [id number]  Assessment/Plan:   Chronic daily headache - suspect related to hypoxia.   Recommended that she speak with her pulmonologist about needing to use O2 during the day, which may help with the headaches that continue during the day. If ineffective, would retry divaloproex 250mg  daily   Subjective:  [name] is a 82 year old woman with coronary artery disease, hypertension, type 1 diabetes mellitus, hyperlipidemia, B12 deficiency and obstructive sleep apnea and tension-type headache who follows up for dizziness and headache.   UPDATE: Started divaloproex at last visit.  Never contacted [place] for refills.  In August, she was admitted to the hospital for hypoxia and altered mental status.  Found to be lethargic and unresponsive.  MRI of brain personally reviewed revealed no acute findings.  Labs negative for infection, ammonia less than 10, TSH and LFTs WNL.  EEG showed diffuse slowing consistent with encephalopathy.  Possible aspiration pneumonia and polypharmacy suspected.  She was found to be significantly hypoxic at night in her sleep.  She now has to sleep with home O2.  Now, she lacks energy.  She wants to sit all of the time.  Activity causes shortness of breath.  Takes all day to clean just one room.    Even though she is not on the divalproex, they are overall improved.  She doesn't wake up with the severe headaches now.  Once she is up and moving around, they may start returning.  She continues having vertigo, aggravated all the time with any slight head movement.  Takes meclizine.    Headaches are unchanged. Intensity:  Moderate to severe Duration:  Wakes up at 5 AM with headache.  Takes Tylenol and resolves by 11 AM.  However, it returns in the evening. Frequency:  Daily Frequency of abortive medication: Tylenol every 4 hours daily Current NSAIDS:  ASA 81mg  Current analgesics:  Tylenol Current triptans:  no Current  ergotamine:  no Current anti-emetic:  Promethazine 25mg  Current muscle relaxants:  Baclofen 40mg  twice daily Current anti-anxiolytic:  Klonopin Current sleep aide:  trazodone Current Antihypertensive medications:  Toprol XL 50mg , Cardizem, Lasix, Imdur Current Antidepressant medications:  none Current Anticonvulsant medications:  none Current anti-CGRP:  no Current Vitamins/Herbal/Supplements:  B12 Current Antihistamines/Decongestants:  meclizine, Allegra, Singulair Other therapy:  ice pack   Caffeine:  1 cup coffee daily, tea Alcohol:  no Smoker:  no Diet:  hydrates Exercise:  yes Depression:  no; Anxiety:  no Other pain:  no Sleep hygiene:  Poor.  Untreated OSA.   She was taken off of CPAP by her prior sleep specialist because of reported noncompliance.  However, Ms. Stowers insists she wore the mask all night and headaches were improved while she used the machine.     HISTORY: Onset:  She has history of migraines as a young woman which resolved after having children.  Current headaches started in 2016. Location:  She has constant holocephalic headache.  She has severe headache radiating from the base of her neck on the right up Quality:  pounding Initial Intensity:  10/10 Aura:  no Prodrome:  no Postdrome:  no Associated symptoms:  None.  No nausea, vomiting, photophobia, phonophobia or visual disturbance.  She has not had any new worse headache of her life, waking up from sleep Initial Duration:  All day Initial Frequency:  Daily headache, severe  Headache every other day Initial Frequency of abortive medication: daily Triggers:  None Relieving factors:  None Activity:  aggravates   Past NSAIDS:  Ibuprofen, naproxen (caused increased heart rate) Past analgesics:  Excedrin, tramadol and other opioids (adverse reaction) Past abortive triptans:  no Past muscle relaxants:  Robaxin, Flexeril Past anti-emetic:  Promethazine (effective) Past antihypertensive medications:   losartan Past antidepressant medications:  nortriptyline (prolonged QT interval) Past anticonvulsant medications:  Topiramate, Gabapentin 200mg  (drowsiness) Past vitamins/Herbal/Supplements:  CoQ10 Past antihistamines/decongestants:  no Other past therapy:  Physical therapy of neck.   Family history of headache:  father   History of vertigo.  Seen in ED in July 2022.  MRI of brain revealed empty sella but otherwise no acute intracranial abnormality.  Occurs while walking or standing.  Not associated with quick head movements or turning over in bed.  Went to vestibular rehab which was ineffective.    MRI of brain without contrast from 09/16/14 revealed partially empty sella but otherwise unremarkable.   MRI of cervical spine from 09/16/14 demonstrated multilevel degenerative changes including left greater than right spurring at C3-C4 causing left foraminal narrowing, severe spurring at C4-C5 causing right moderate foraminal narrowing, and disc bulg and spurring at C6-C7 with moderate bilateral foraminal narrowing.  She was evaluated by Dr. Ethelene Hal for interventional pain (epidural or facet joint injections).  He didn't think it was a good idea given that she would have to stop Plavix.  CT head and c-spine in November 2023 showed arthritis in neck but no acute findings.  PAST MEDICAL HISTORY: Past Medical History:  Diagnosis Date   Adenomatous colon polyp    Allergy    Anxiety    Arthritis    Asthma    Carotid artery disease (HCC)    carotid US 02/2017: bilat ICA 1-39%   Chronic diastolic CHF    Echo 02/2017: EF 65-70, Gr 2 DD, mild MS (mean 5), PASP 44   COPD    pt is unsure if has been officially diagnosed   Coronary artery disease    CABG '09- cathed 12/09, 9/10, 6/11, 3/14 and 12/13/16- medical Rx // cath 12/2016 - 2/3 grafts patent >> med Rx // Myoview 12/17: low risk   Diabetes mellitus    Dyspnea    with exertion   Gastroesophageal reflux disease    Headache    Hiatal hernia     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     albuterol (PROVENTIL HFA;VENTOLIN HFA) 108 (90 BASE) MCG/ACT inhaler Inhale 2 puffs into the lungs every 6 (six) hours as needed. For shortness of breath. 1 Inhaler 0   albuterol (PROVENTIL) (2.5 MG/3ML) 0.083% nebulizer solution Take 3 mLs (2.5 mg total) by nebulization every 6 (six) hours as needed for wheezing or shortness of breath. 360 mL 5   aspirin EC 81 MG tablet Take 1 tablet (81 mg total) by mouth daily.     benzonatate (TESSALON) 200 MG capsule Take 1 capsule (200 mg total) by mouth 3 (three) times daily as needed for cough. 90 capsule 0  calcium carbonate (TUMS - DOSED IN MG ELEMENTAL CALCIUM) 500 MG chewable tablet Chew 1 tablet by mouth daily as needed for indigestion or heartburn.     Cholecalciferol (VITAMIN D) 50 MCG (2000 UT) tablet Take 4,000 Units by mouth daily.     Continuous Blood Gluc Receiver (FREESTYLE LIBRE 2 READER) DEVI 1 Device by Does not apply route every 14 (fourteen) days. 1 each 3   Continuous Blood Gluc Sensor (FREESTYLE LIBRE 2 SENSOR) MISC 1 Device by Does not apply route every 14 (fourteen) days. 6 each 3   Cyanocobalamin (VITAMIN B-12 IJ) Inject 1,000 mcg as directed every 30 (thirty) days.     diltiazem (CARDIZEM CD) 240 MG 24 hr capsule Take 1 capsule (240 mg total) by mouth daily. 90 capsule 2   divalproex (DEPAKOTE ER) 250 MG 24 hr tablet TAKE 1 TABLET (250 MG TOTAL) BY MOUTH AT BEDTIME. 90 tablet 1   fluticasone (FLONASE) 50 MCG/ACT nasal spray SPRAY 2 SPRAYS INTO EACH  NOSTRIL EVERY DAY (Patient taking differently: Place 2 sprays into both nostrils daily as needed for allergies.) 48 mL 3   Fluticasone-Umeclidin-Vilant (TRELEGY ELLIPTA) 200-62.5-25 MCG/ACT AEPB Inhale 1 puff into the lungs daily at 6 (six) AM. 1 each 0   glucose blood test strip Use as instructed to test blood sugars up to 4 times daily 200 each 12   insulin lispro protamine-lispro (HUMALOG 75/25 MIX) (75-25) 100 UNIT/ML SUSP injection Inject 18 Units into the skin 2 (two) times daily with a meal. 10 units at lunch depending on bs reading (Patient taking differently: Inject 6 Units into the skin See admin instructions. Inject 6  units under the skin at breakfast and 6 units in the evening  PT does not takes if has not eaten) 40 mL 3   Insulin Syringe-Needle U-100 (INSULIN SYRINGE .3CC/29GX1") 29G X 1" 0.3 ML MISC 1 Device by Does not apply route in the morning and at bedtime. 200 each 3   isosorbide mononitrate (IMDUR) 60 MG 24 hr tablet Take one tablet ( 60 mg ) twice daily 8 hours apart. 180 tablet 3   meclizine (ANTIVERT) 25 MG tablet Take 25 mg by mouth 2 (two) times daily as needed for dizziness.     meloxicam (MOBIC) 15 MG tablet Take 15 mg by mouth daily.     metoprolol succinate (TOPROL-XL) 100 MG 24 hr tablet Take 1 tablet (100 mg total) by mouth daily. Take with or immediately following a meal. 90 tablet 3   Multiple Vitamin (MULTIVITAMIN WITH MINERALS) TABS Take 1 tablet by mouth daily.     neomycin-polymyxin b-dexamethasone (MAXITROL) 3.5-10000-0.1 SUSP SMARTSIG:In Eye(s)     nitroGLYCERIN (NITROSTAT) 0.4 MG SL tablet Place 1 tablet (0.4 mg total) under the tongue every 5 (five) minutes as needed for chest pain. 25 tablet 3   ondansetron (ZOFRAN) 4 MG tablet Take 1 tablet (4 mg total) by mouth every 8 (eight) hours as needed for refractory nausea / vomiting, vomiting or nausea. 20 tablet 1   sertraline (ZOLOFT) 25 MG tablet Take 1 tablet (25 mg total) by mouth daily. After 2 weeks increase  to 50 mg daily 60 tablet 5   spironolactone (ALDACTONE) 25 MG tablet TAKE 1 TABLET BY MOUTH EVERY DAY 90 tablet 0   temazepam (RESTORIL) 30 MG capsule Take 1 capsule (30 mg total) by mouth daily. (Patient taking differently: Take 30 mg by mouth at bedtime.) 30 capsule 0   Current Facility-Administered Medications on File Prior to Visit  Medication Dose Route Frequency Provider Last Rate Last Admin   Study - ORION 4 - inclisiran 300 mg/1.52mL or placebo SQ injection (PI-Stuckey)  300 mg Subcutaneous Q6 months Herby Abraham, MD   300 mg at 06/22/22 1015    ALLERGIES: Allergies  Allergen Reactions   Amlodipine Other (See Comments)    hallucinations    Banana Nausea And Vomiting    Stomach pumped   Co Q10 [Coenzyme Q10] Other (See Comments)    Body cramps   Codeine Other (See Comments)    Hallucinate, loose identity and don't know who I am   Insulin Aspart Other (See Comments)    Unknown   Lisinopril Other (See Comments)    nose bleed   Metformin And Related Nausea And Vomiting   Morphine Other (See Comments)    Can not function, it immobilizes me    Pentazocine Nausea And Vomiting   Pravastatin Other (See Comments)    Hands locked up   Repatha [Evolocumab] Other (See Comments)    myalgias   Statins Other (See Comments)    Muscle cramps   Sulfa Antibiotics Swelling   Sulfonamide Derivatives Swelling   Tramadol Hcl Other (See Comments)    Dizziness, cant function     FAMILY HISTORY: Family History  Problem Relation Age of Onset   Heart disease Maternal Grandfather    Heart failure Maternal Grandfather    Diabetes Maternal Grandfather    Heart attack Father    Diabetes Mother    Rheum arthritis Sister    Emphysema Paternal Uncle    Esophageal cancer Brother 89       she said he was born with it   Emphysema Paternal Aunt    Healthy Child    Neuropathy Neg Hx    Multiple sclerosis Neg Hx    Colon cancer Neg Hx    Colon polyps Neg Hx    Rectal cancer Neg Hx     Stomach cancer Neg Hx       Objective:  Blood pressure 122/71, pulse 95, height 5\' 2"  (1.575 m), weight 164 lb (74.4 kg), SpO2 96%. General: No acute distress.  Patient appears well-groomed.   Head:  Normocephalic/atraumatic Eyes:  Fundi examined but not visualized Neck: supple, no paraspinal tenderness, full range of motion Heart:  Regular rate and rhythm Neurological Exam: alert and oriented.  Speech fluent and not dysarthric, language intact.  CN II-XII intact. Bulk and tone normal, muscle strength 5/5 throughout.  Sensation to light touch intact.  Deep tendon reflexes 2+ throughout.  Finger to nose testing intact.  Gait cautious but steady. Romberg negative.    Shon Millet, DO  CC: Cheryll Cockayne, MD

## 2022-11-22 ENCOUNTER — Encounter: Payer: Self-pay | Admitting: Neurology

## 2022-11-22 ENCOUNTER — Ambulatory Visit: Payer: Medicare Other | Admitting: Neurology

## 2022-11-22 VITALS — BP 122/71 | HR 95 | Ht 62.0 in | Wt 164.0 lb

## 2022-11-22 DIAGNOSIS — R519 Headache, unspecified: Secondary | ICD-10-CM

## 2022-11-22 NOTE — Patient Instructions (Signed)
See if headaches improve if you use oxygen during the day.  If no improvement, contact me and we can retry the medication

## 2022-11-30 ENCOUNTER — Other Ambulatory Visit: Payer: Self-pay | Admitting: Internal Medicine

## 2022-12-01 ENCOUNTER — Other Ambulatory Visit: Payer: Self-pay | Admitting: Internal Medicine

## 2022-12-01 ENCOUNTER — Other Ambulatory Visit: Payer: Self-pay

## 2022-12-01 MED ORDER — SERTRALINE HCL 25 MG PO TABS: 25.0000 mg | ORAL_TABLET | Freq: Two times a day (BID) | ORAL | 1 refills | Status: DC

## 2022-12-24 ENCOUNTER — Encounter: Payer: Medicare Other | Admitting: *Deleted

## 2022-12-24 DIAGNOSIS — Z006 Encounter for examination for normal comparison and control in clinical research program: Secondary | ICD-10-CM

## 2022-12-24 MED ORDER — STUDY - ORION 4 - INCLISIRAN 300 MG/1.5 ML OR PLACEBO SQ INJECTION (PI-STUCKEY)
300.0000 mg | INJECTION | SUBCUTANEOUS | Status: DC
  Administered 2022-12-24: 300 mg via SUBCUTANEOUS
  Filled 2022-12-24: qty 1.5

## 2022-12-24 NOTE — Research (Unsigned)
ORION 4  Patient doing well, no complaints.  No med changes per patient   IP given  Next visit set for April

## 2023-01-21 ENCOUNTER — Telehealth: Payer: Self-pay | Admitting: Internal Medicine

## 2023-01-21 NOTE — Telephone Encounter (Signed)
MyChart message sent to patient.

## 2023-01-21 NOTE — Telephone Encounter (Signed)
Pt called wanting to speak with a nurse about how she is feeling dizzy and light headed. Advise pt that we do not have any opening offer another site of urgent care. Please advise.

## 2023-01-24 ENCOUNTER — Ambulatory Visit (INDEPENDENT_AMBULATORY_CARE_PROVIDER_SITE_OTHER): Payer: Medicare Other | Admitting: Internal Medicine

## 2023-01-24 ENCOUNTER — Encounter: Payer: Self-pay | Admitting: Internal Medicine

## 2023-01-24 VITALS — BP 138/82 | HR 72 | Temp 98.8°F | Resp 16 | Ht 62.0 in | Wt 170.0 lb

## 2023-01-24 DIAGNOSIS — J440 Chronic obstructive pulmonary disease with acute lower respiratory infection: Secondary | ICD-10-CM

## 2023-01-24 DIAGNOSIS — J209 Acute bronchitis, unspecified: Secondary | ICD-10-CM

## 2023-01-24 DIAGNOSIS — I10 Essential (primary) hypertension: Secondary | ICD-10-CM | POA: Diagnosis not present

## 2023-01-24 MED ORDER — METOPROLOL SUCCINATE ER 100 MG PO TB24: 100.0000 mg | ORAL_TABLET | Freq: Every day | ORAL | 3 refills | Status: DC

## 2023-01-24 MED ORDER — PANTOPRAZOLE SODIUM 40 MG PO TBEC: 40.0000 mg | DELAYED_RELEASE_TABLET | Freq: Every day | ORAL | 2 refills | Status: DC

## 2023-01-24 MED ORDER — DILTIAZEM HCL ER COATED BEADS 240 MG PO CP24: 240.0000 mg | ORAL_CAPSULE | Freq: Every day | ORAL | 2 refills | Status: DC

## 2023-01-24 MED ORDER — TRELEGY ELLIPTA 200-62.5-25 MCG/ACT IN AEPB: 1.0000 | INHALATION_SPRAY | Freq: Every day | RESPIRATORY_TRACT | 5 refills | Status: AC

## 2023-01-24 MED ORDER — SPIRONOLACTONE 25 MG PO TABS: 25.0000 mg | ORAL_TABLET | Freq: Every day | ORAL | 1 refills | Status: DC

## 2023-01-24 MED ORDER — AMOXICILLIN-POT CLAVULANATE 875-125 MG PO TABS: 1.0000 | ORAL_TABLET | Freq: Two times a day (BID) | ORAL | 0 refills | Status: AC

## 2023-01-24 NOTE — Progress Notes (Signed)
Subjective:    Patient ID: [name], female    DOB: [birth year], 82 y.o.   MRN: [id number]      HPI Shantese is here for  Chief Complaint  Patient presents with   Cough    Cough and congestion since Thanksgiving    Symptoms started one week ago.   She was lightheaded initially, nauseated,   BP yesterday 180/100 - > 174/90.    Taking tylenol.    Using trelegy.   Medications and allergies reviewed with patient and updated if appropriate.  Current Outpatient Medications on File Prior to Visit  Medication Sig Dispense Refill   acetaminophen (TYLENOL) 325 MG tablet Take 650 mg by mouth every 6 (six) hours as needed for moderate pain or headache.     albuterol (PROVENTIL HFA;VENTOLIN HFA) 108 (90 BASE) MCG/ACT inhaler Inhale 2 puffs into the lungs every 6 (six) hours as needed. For shortness of breath. 1 Inhaler 0   albuterol (PROVENTIL) (2.5 MG/3ML) 0.083% nebulizer solution Take 3 mLs (2.5 mg total) by nebulization every 6 (six) hours as needed for wheezing or shortness of breath. 360 mL 5   aspirin EC 81 MG tablet Take 1 tablet (81 mg total) by mouth daily.     benzonatate (TESSALON) 200 MG capsule Take 1 capsule (200 mg total) by mouth 3 (three) times daily as needed for cough. 90 capsule 0   calcium carbonate (TUMS - DOSED IN MG ELEMENTAL CALCIUM) 500 MG chewable tablet Chew 1 tablet by mouth daily as needed for indigestion or heartburn.     Cholecalciferol (VITAMIN D) 50 MCG (2000 UT) tablet Take 4,000 Units by mouth daily.     Continuous Blood Gluc Receiver (FREESTYLE LIBRE 2 READER) DEVI 1 Device by Does not apply route every 14 (fourteen) days. 1 each 3   Continuous Blood Gluc Sensor (FREESTYLE LIBRE 2 SENSOR) MISC 1 Device by Does not apply route every 14 (fourteen) days. 6 each 3   Cyanocobalamin (VITAMIN B-12 IJ) Inject 1,000 mcg as directed every 30 (thirty) days.     diltiazem (CARDIZEM CD) 240 MG 24 hr capsule Take 1 capsule (240 mg total) by mouth daily. 90 capsule  2   divalproex (DEPAKOTE ER) 250 MG 24 hr tablet TAKE 1 TABLET (250 MG TOTAL) BY MOUTH AT BEDTIME. 90 tablet 1   fluticasone (FLONASE) 50 MCG/ACT nasal spray SPRAY 2 SPRAYS INTO EACH NOSTRIL EVERY DAY (Patient taking differently: Place 2 sprays into both nostrils daily as needed for allergies.) 48 mL 3   Fluticasone-Umeclidin-Vilant (TRELEGY ELLIPTA) 200-62.5-25 MCG/ACT AEPB Inhale 1 puff into the lungs daily at 6 (six) AM. 1 each 0   glucose blood test strip Use as instructed to test blood sugars up to 4 times daily 200 each 12   insulin lispro protamine-lispro (HUMALOG 75/25 MIX) (75-25) 100 UNIT/ML SUSP injection Inject 18 Units into the skin 2 (two) times daily with a meal. 10 units at lunch depending on bs reading (Patient taking differently: Inject 6 Units into the skin See admin instructions. Inject 6  units under the skin at breakfast and 6 units in the evening  PT does not takes if has not eaten) 40 mL 3   Insulin Syringe-Needle U-100 (INSULIN SYRINGE .3CC/29GX1") 29G X 1" 0.3 ML MISC 1 Device by Does not apply route in the morning and at bedtime. 200 each 3   isosorbide mononitrate (IMDUR) 60 MG 24 hr tablet Take one tablet ( 60 mg ) twice daily 8  hours apart. 180 tablet 3   meclizine (ANTIVERT) 25 MG tablet Take 25 mg by mouth 2 (two) times daily as needed for dizziness.     Multiple Vitamin (MULTIVITAMIN WITH MINERALS) TABS Take 1 tablet by mouth daily.     neomycin-polymyxin b-dexamethasone (MAXITROL) 3.5-10000-0.1 SUSP SMARTSIG:In Eye(s)     nitroGLYCERIN (NITROSTAT) 0.4 MG SL tablet Place 1 tablet (0.4 mg total) under the tongue every 5 (five) minutes as needed for chest pain. 25 tablet 3   ondansetron (ZOFRAN) 4 MG tablet TAKE 1 TABLET BY MOUTH EVERY 8 HOURS AS NEEDED FOR NAUSEA AND VOMITING 20 tablet 1   sertraline (ZOLOFT) 25 MG tablet Take 1 tablet (25 mg total) by mouth daily. After 2 weeks increase to 50 mg daily 60 tablet 5   spironolactone (ALDACTONE) 25 MG tablet TAKE 1 TABLET  BY MOUTH EVERY DAY 90 tablet 0   temazepam (RESTORIL) 30 MG capsule Take 1 capsule (30 mg total) by mouth daily. (Patient taking differently: Take 30 mg by mouth at bedtime.) 30 capsule 0   Current Facility-Administered Medications on File Prior to Visit  Medication Dose Route Frequency Provider Last Rate Last Admin   Study - ORION 4 - inclisiran 300 mg/1.78mL or placebo SQ injection (PI-Stuckey)  300 mg Subcutaneous Q6 months    300 mg at 12/24/22 1150    Review of Systems  Constitutional:  Positive for appetite change and chills. Negative for fever.  HENT:  Positive for rhinorrhea. Negative for congestion, ear pain, sinus pressure, sinus pain and sore throat.   Respiratory:  Positive for cough (dry), chest tightness, shortness of breath and wheezing.   Cardiovascular:  Negative for chest pain.  Gastrointestinal:  Positive for nausea. Negative for diarrhea.  Musculoskeletal:  Negative for myalgias.  Neurological:  Positive for dizziness, light-headedness and headaches.       Objective:   Vitals:   01/24/23 1143  BP: (!) 148/98  Pulse: 72  Resp: 16  Temp: 98.8 F (37.1 C)   BP Readings from Last 3 Encounters:  01/24/23 (!) 148/98  12/24/22 (!) 142/83  11/22/22 122/71   Wt Readings from Last 3 Encounters:  01/24/23 170 lb (77.1 kg)  12/24/22 160 lb (72.6 kg)  11/22/22 164 lb (74.4 kg)   Body mass index is 31.09 kg/m.    Physical Exam         Assessment & Plan:    See Problem List for Assessment and Plan of chronic medical problems.

## 2023-01-24 NOTE — Assessment & Plan Note (Signed)
Chronic Blood pressure has been quite elevated at home-better here upon repeat, but at home it has been very elevated Discussed there are 3 medications she should be taking on a daily basis Refill sent to pharmacy She will continue to monitor her blood pressure at home and let me know if it does not improve Continue diltiazem 240 mg daily, metoprolol 100 mg daily and spironolactone 25 mg daily

## 2023-01-24 NOTE — Patient Instructions (Addendum)
       Medications changes include :   restart BP meds.   Augmentin twice daily x 10 days.    Use your trelegy daily and use your nebulizer every 4 hours     Your BP medications: Diltiazem Metoprolol Spironolactone     Return if symptoms worsen or fail to improve.

## 2023-01-24 NOTE — Assessment & Plan Note (Signed)
Acute Acute bronchitis with asthma/COPD exacerbation Concern for bacterial cause Start Augmentin 875-125 mg twice daily Lungs are actually clear here today so we will hold off on prednisone given that her sugars are so high Stressed Trelegy once a day, nebulizer treatments every 4 hours If there is known treatment we will need to use prednisone She will call if there is no improvement

## 2023-01-31 ENCOUNTER — Telehealth: Payer: Self-pay | Admitting: Internal Medicine

## 2023-01-31 NOTE — Telephone Encounter (Signed)
Received physician order from [facility], needing a signature in faxes. Forms will be in providers box.

## 2023-01-31 NOTE — Telephone Encounter (Signed)
Form faxed back to them last week.  Dr. Lawerance Bach will not be signing off on form.  Needs to be sent to Pulmonary for supplies requested.   This info was also written on form that was faxed back.

## 2023-02-03 ENCOUNTER — Other Ambulatory Visit: Payer: Self-pay | Admitting: Neurology

## 2023-02-15 ENCOUNTER — Ambulatory Visit: Payer: Self-pay | Admitting: Internal Medicine

## 2023-02-15 NOTE — Telephone Encounter (Signed)
  Chief Complaint: Mouth Pain, UTI symptoms Symptoms: Pain in mouth, pain with urination Frequency: approx 1 week for both Pertinent Negatives: Patient denies blood in urine, fever, nausea, vomiting, open sores in mouth Disposition: [] ED /[] Urgent Care (no appt availability in office) / [] Appointment(In office/virtual)/ []  [facility]/ [] Home Care/ [x] Refused Recommended Disposition /[] [facility]/ []  Follow-up with PCP Additional Notes: Patient calls stating she has had uti symptoms and thrush x 1 week. Patient reports she is having burning with urination and increased frequency. She states that she has also been using her inhaler more than normal, which she thinks has led to the mouth pain. Pt denies fever, sores in mouth, blood in urine, nausea or vomiting. Reports some loose stools. Per protocol for UTI symptoms, pt to be evaluated within 24 hours- patient offered 3 appts, one today at 1420 and two tomorrow at 0850 and 0900. Pt states she is unable to be seen at those times due to caring for a family member. This RN suggested UC for evaluation, pt declined and requests PCP to call in medications. Patient is requesting a call back. Care advice reviewed, patient verbalized understanding. Alerting PCP for review and follow up.  Copied from CRM [id number]. Topic: Clinical - Pink Word Triage >> Feb 15, 2023  1:01 PM [place] ORN wrote: Reason for Triage: Pain with urination, and possible thrush from using inhaler. Pt requesting to speak with a nurse Reason for Disposition  [1] MILD-MODERATE mouth pain AND [2] present > 3 days  Urinating more frequently than usual (i.e., frequency)  Answer Assessment - Initial Assessment Questions 1. SYMPTOM: What's the main symptom you're concerned about? (e.g., frequency, incontinence)     Pain with urination, increased urination, burning with urination 2. ONSET: When did the  symptoms  start?     1 week 3. PAIN: Is there any pain? If Yes,  ask: How bad is it? (Scale: 1-10; mild, moderate, severe)     Denies 4. CAUSE: What do you think is causing the symptoms?     Frequently gets UTI 5. OTHER SYMPTOMS: Do you have any other symptoms? (e.g., blood in urine, fever, flank pain, pain with urination)     Pain with urination  Answer Assessment - Initial Assessment Questions 1. ONSET: When did the mouth start hurting? (e.g., hours or days ago)      Approx 02/09/23 2. SEVERITY: How bad is the pain? (Scale 1-10; mild, moderate or severe)   - MILD (1-3):  doesn't interfere with eating or normal activities   - MODERATE (4-7): interferes with eating some solids and normal activities   - SEVERE (8-10):  excruciating pain, interferes with most normal activities   - SEVERE DYSPHAGIA: can't swallow liquids, drooling     Soreness 3. SORES: Are there any sores or ulcers in the mouth? If Yes, ask: What part of the mouth are the sores in?     Denies 4. FEVER: Do you have a fever? If Yes, ask: What is your temperature, how was it measured, and when did it start?     Denies 5. CAUSE: What do you think is causing the mouth pain?     Thrush 6. OTHER SYMPTOMS: Do you have any other symptoms? (e.g., difficulty breathing)     States she has been using her inhaler more frequently and believes that is causing the symptoms.  Protocols used: Urinary Symptoms-A-AH, Mouth Pain-A-AH

## 2023-02-16 ENCOUNTER — Other Ambulatory Visit: Payer: Self-pay

## 2023-02-16 ENCOUNTER — Encounter (HOSPITAL_BASED_OUTPATIENT_CLINIC_OR_DEPARTMENT_OTHER): Payer: Self-pay | Admitting: Emergency Medicine

## 2023-02-16 ENCOUNTER — Emergency Department (HOSPITAL_BASED_OUTPATIENT_CLINIC_OR_DEPARTMENT_OTHER): Payer: Medicare Other | Admitting: Radiology

## 2023-02-16 ENCOUNTER — Emergency Department (HOSPITAL_BASED_OUTPATIENT_CLINIC_OR_DEPARTMENT_OTHER)
Admission: EM | Admit: 2023-02-16 | Discharge: 2023-02-16 | Disposition: A | Discharge status: Home / Self Care | Payer: Medicare Other | Attending: Emergency Medicine | Admitting: Emergency Medicine

## 2023-02-16 ENCOUNTER — Telehealth: Payer: Self-pay | Admitting: Cardiology

## 2023-02-16 ENCOUNTER — Telehealth: Payer: Self-pay | Admitting: Cardiovascular Disease

## 2023-02-16 DIAGNOSIS — R42 Dizziness and giddiness: Secondary | ICD-10-CM | POA: Insufficient documentation

## 2023-02-16 DIAGNOSIS — J811 Chronic pulmonary edema: Secondary | ICD-10-CM | POA: Diagnosis not present

## 2023-02-16 DIAGNOSIS — R350 Frequency of micturition: Secondary | ICD-10-CM | POA: Diagnosis not present

## 2023-02-16 DIAGNOSIS — Z7982 Long term (current) use of aspirin: Secondary | ICD-10-CM | POA: Insufficient documentation

## 2023-02-16 DIAGNOSIS — R001 Bradycardia, unspecified: Secondary | ICD-10-CM | POA: Insufficient documentation

## 2023-02-16 DIAGNOSIS — R0602 Shortness of breath: Secondary | ICD-10-CM | POA: Diagnosis not present

## 2023-02-16 DIAGNOSIS — J810 Acute pulmonary edema: Secondary | ICD-10-CM

## 2023-02-16 LAB — TROPONIN I (HIGH SENSITIVITY)
Troponin I (High Sensitivity): 4 ng/L (ref <18)
Troponin I (High Sensitivity): 4 ng/L (ref <18)

## 2023-02-16 LAB — BASIC METABOLIC PANEL
Anion gap: 5 (ref 5–15)
BUN: 24 mg/dL — ABNORMAL HIGH (ref 8–23)
CO2: 30 mmol/L (ref 22–32)
Calcium: 9 mg/dL (ref 8.9–10.3)
Chloride: 97 mmol/L — ABNORMAL LOW (ref 98–111)
Creatinine, Ser: 0.84 mg/dL (ref 0.44–1.00)
GFR, Estimated: >60 mL/min (ref >60)
Glucose, Bld: 198 mg/dL — ABNORMAL HIGH (ref 70–99)
Potassium: 4.1 mmol/L (ref 3.5–5.1)
Sodium: 132 mmol/L — ABNORMAL LOW (ref 135–145)

## 2023-02-16 LAB — URINALYSIS, W/ REFLEX TO CULTURE (INFECTION SUSPECTED)
Bilirubin Urine: NEGATIVE
Glucose, UA: NEGATIVE mg/dL
Hgb urine dipstick: NEGATIVE
Ketones, ur: NEGATIVE mg/dL
Leukocytes,Ua: NEGATIVE
Nitrite: NEGATIVE
Protein, ur: NEGATIVE mg/dL
Specific Gravity, Urine: 1.025 (ref 1.005–1.030)
pH: 5.5 (ref 5.0–8.0)

## 2023-02-16 LAB — CBC
HCT: 35.9 % — ABNORMAL LOW (ref 36.0–46.0)
Hemoglobin: 11.7 g/dL — ABNORMAL LOW (ref 12.0–15.0)
MCH: 27.9 pg (ref 26.0–34.0)
MCHC: 32.6 g/dL (ref 30.0–36.0)
MCV: 85.5 fL (ref 80.0–100.0)
Platelets: 256 10*3/uL (ref 150–400)
RBC: 4.2 MIL/uL (ref 3.87–5.11)
RDW: 14.3 % (ref 11.5–15.5)
WBC: 6.1 10*3/uL (ref 4.0–10.5)
nRBC: 0 % (ref 0.0–0.2)

## 2023-02-16 LAB — BRAIN NATRIURETIC PEPTIDE: B Natriuretic Peptide: 722 pg/mL — ABNORMAL HIGH (ref 0.0–100.0)

## 2023-02-16 MED ORDER — ACETAMINOPHEN 325 MG PO TABS
650.0000 mg | ORAL_TABLET | Freq: Once | ORAL | Status: AC
  Administered 2023-02-16: 650 mg via ORAL
  Filled 2023-02-16: qty 2

## 2023-02-16 MED ORDER — FUROSEMIDE 10 MG/ML IJ SOLN
40.0000 mg | Freq: Once | INTRAMUSCULAR | Status: AC
  Administered 2023-02-16: 40 mg via INTRAVENOUS
  Filled 2023-02-16: qty 4

## 2023-02-16 NOTE — Telephone Encounter (Signed)
 Took stat call. Spoke with patient who stated she was not able to get her heart rate up. Pt sounded SOB and hard to understand, to this nurse, pt did state she could not tell if she was SOB because she uses oxygen  to sleep. She stated she was very tired and had talked to someone (again Pt was hard to understand) yesterday that told her to go to the ED or call us . Advised pt to call 911 or go to the ED.

## 2023-02-16 NOTE — ED Notes (Addendum)
 Pt given discharge instructions and reviewed medication changes. Opportunities given for questions. Pt verbalizes understanding. PIV removed x1.  Jillyn Hidden, RN

## 2023-02-16 NOTE — Discharge Instructions (Addendum)
 Stop taking your metoprolol for now.  You need to have close follow-up with your cardiologist.  Return to the emergency room if you have any worsening symptoms.

## 2023-02-16 NOTE — Telephone Encounter (Signed)
 STAT if HR is under 50 or over 120 (normal HR is 60-100 beats per minute)  What is your heart rate? 45  Do you have a log of your heart rate readings (document readings)? 51, 53, 54, 45  Do you have any other symptoms? Tired

## 2023-02-16 NOTE — ED Notes (Signed)
 ED Provider at bedside.

## 2023-02-16 NOTE — ED Triage Notes (Signed)
 Pt arrived from home c/o bradycardia (50 yesterday, 49 today). Takes Diltiazem, metoprolol, and imdur at home. Was taken yesterday, nothing taken today.

## 2023-02-16 NOTE — ED Provider Notes (Addendum)
 [place] EMERGENCY DEPARTMENT AT [facility] Provider Note   CSN: [id number] Arrival date & time: 02/16/23  1056     History  Chief Complaint  Patient presents with   Bradycardia    [name] is a 83 y.o. female.  Patient is a 83 year old female who presents with low heart rate.  She states that she has been feeling fatigued for the last couple days.  She is also had some lightheadedness.  Home health nurse noted that her heart rate was low in the low 50s yesterday.  She woke up this morning it was in the 40s.  She says her blood pressures been okay.  She has not changed her medications recently.  She has had some pressure feeling in her chest and her neck for about the last 2 weeks.  She said this is been constant that is unchanged.  It is not exertional.  She does report a little bit of shortness of breath.  She attributes this to her COPD.  She denies any known fevers.  She says she is prone to getting urinary tract infections and feels like she may have 1.  She has had some urinary frequency.  She had little bit of rhinorrhea but she says that is been going on since the weather changed and is unchanged.       Home Medications Prior to Admission medications   Medication Sig Start Date End Date Taking? Authorizing Provider  acetaminophen  (TYLENOL ) 325 MG tablet Take 650 mg by mouth every 6 (six) hours as needed for moderate pain or headache.    [provider]  albuterol  (PROVENTIL  HFA;VENTOLIN  HFA) 108 (90 BASE) MCG/ACT inhaler Inhale 2 puffs into the lungs every 6 (six) hours as needed. For shortness of breath. 03/06/12   Brien Belvie BRAVO, MD  albuterol  (PROVENTIL ) (2.5 MG/3ML) 0.083% nebulizer solution Take 3 mLs (2.5 mg total) by nebulization every 6 (six) hours as needed for wheezing or shortness of breath. 10/19/22   Geofm Glade PARAS, MD  aspirin  EC 81 MG tablet Take 1 tablet (81 mg total) by mouth daily. 12/10/16   Lelon Hamilton T, PA-C  benzonatate   (TESSALON ) 200 MG capsule Take 1 capsule (200 mg total) by mouth 3 (three) times daily as needed for cough. 10/19/22   Geofm Glade PARAS, MD  calcium  carbonate (TUMS - DOSED IN MG ELEMENTAL CALCIUM ) 500 MG chewable tablet Chew 1 tablet by mouth daily as needed for indigestion or heartburn.    [provider]  Cholecalciferol  (VITAMIN D ) 50 MCG (2000 UT) tablet Take 4,000 Units by mouth daily.    [provider]  Continuous Blood Gluc Receiver (FREESTYLE LIBRE 2 READER) DEVI 1 Device by Does not apply route every 14 (fourteen) days. 05/03/22   Shamleffer, Ibtehal Jaralla, MD  Continuous Blood Gluc Sensor (FREESTYLE LIBRE 2 SENSOR) MISC 1 Device by Does not apply route every 14 (fourteen) days. 05/03/22   Shamleffer, Ibtehal Jaralla, MD  Cyanocobalamin  (VITAMIN B-12 IJ) Inject 1,000 mcg as directed every 30 (thirty) days.    [provider]  diltiazem  (CARDIZEM  CD) 240 MG 24 hr capsule Take 1 capsule (240 mg total) by mouth daily. 01/24/23   Geofm Glade PARAS, MD  divalproex  (DEPAKOTE  ER) 250 MG 24 hr tablet TAKE 1 TABLET (250 MG TOTAL) BY MOUTH AT BEDTIME. 02/03/23   Jaffe, Adam R, DO  fluticasone  (FLONASE ) 50 MCG/ACT nasal spray SPRAY 2 SPRAYS INTO EACH NOSTRIL EVERY DAY Patient taking differently: Place 2 sprays into  both nostrils daily as needed for allergies. 03/18/22   Mannam, Praveen, MD  Fluticasone -Umeclidin-Vilant (TRELEGY ELLIPTA ) 200-62.5-25 MCG/ACT AEPB Inhale 1 puff into the lungs daily at 6 (six) AM. 01/24/23   Burns, Glade PARAS, MD  glucose blood test strip Use as instructed to test blood sugars up to 4 times daily 11/04/21   Geofm Glade PARAS, MD  insulin  lispro protamine-lispro (HUMALOG  75/25 MIX) (75-25) 100 UNIT/ML SUSP injection Inject 18 Units into the skin 2 (two) times daily with a meal. 10 units at lunch depending on bs reading Patient taking differently: Inject 6 Units into the skin See admin instructions. Inject 6  units under the skin at breakfast and 6 units in the  evening  PT does not takes if has not eaten 03/30/22   Shamleffer, Ibtehal Jaralla, MD  Insulin  Syringe-Needle U-100 (INSULIN  SYRINGE .3CC/29GX1) 29G X 1 0.3 ML MISC 1 Device by Does not apply route in the morning and at bedtime. 12/18/21   Shamleffer, Ibtehal Jaralla, MD  isosorbide  mononitrate (IMDUR ) 60 MG 24 hr tablet Take one tablet ( 60 mg ) twice daily 8 hours apart. 05/14/20   Lelon Hamilton T, PA-C  meclizine  (ANTIVERT ) 25 MG tablet Take 25 mg by mouth 2 (two) times daily as needed for dizziness.    [provider]  Multiple Vitamin (MULTIVITAMIN WITH MINERALS) TABS Take 1 tablet by mouth daily.    [provider]  neomycin-polymyxin b -dexamethasone  (MAXITROL) 3.5-10000-0.1 SUSP SMARTSIG:In Eye(s) 05/04/22   [provider]  nitroGLYCERIN  (NITROSTAT ) 0.4 MG SL tablet Place 1 tablet (0.4 mg total) under the tongue every 5 (five) minutes as needed for chest pain. 07/22/21   Claudene Victory ORN, MD  ondansetron  (ZOFRAN ) 4 MG tablet TAKE 1 TABLET BY MOUTH EVERY 8 HOURS AS NEEDED FOR NAUSEA AND VOMITING 12/01/22   Geofm Glade PARAS, MD  pantoprazole  (PROTONIX ) 40 MG tablet Take 1 tablet (40 mg total) by mouth daily. 01/24/23   Geofm Glade PARAS, MD  sertraline  (ZOLOFT ) 25 MG tablet Take 1 tablet (25 mg total) by mouth daily. After 2 weeks increase to 50 mg daily 11/08/22   Geofm Glade PARAS, MD  spironolactone  (ALDACTONE ) 25 MG tablet Take 1 tablet (25 mg total) by mouth daily. 01/24/23   Geofm Glade PARAS, MD  temazepam  (RESTORIL ) 30 MG capsule Take 1 capsule (30 mg total) by mouth daily. Patient taking differently: Take 30 mg by mouth at bedtime. 04/07/22   Geofm Glade PARAS, MD      Allergies    Amlodipine, Banana, Co q10 [coenzyme q10], Codeine, Insulin  aspart (human analog), Lisinopril , Metformin  and related, Morphine, Pentazocine, Pravastatin , Repatha [evolocumab], Statins, Sulfa antibiotics, Sulfonamide derivatives, and Tramadol  hcl    Review of Systems   Review of Systems   Constitutional:  Positive for fatigue. Negative for chills, diaphoresis and fever.  HENT:  Negative for congestion, rhinorrhea and sneezing.   Eyes: Negative.   Respiratory:  Positive for shortness of breath. Negative for cough and chest tightness.   Cardiovascular:  Negative for chest pain and leg swelling.  Gastrointestinal:  Negative for abdominal pain, blood in stool, diarrhea, nausea and vomiting.  Genitourinary:  Positive for dysuria and frequency. Negative for difficulty urinating, flank pain and hematuria.  Musculoskeletal:  Negative for arthralgias and back pain.  Skin:  Negative for rash.  Neurological:  Positive for light-headedness. Negative for speech difficulty, weakness, numbness and headaches.    Physical Exam Updated Vital Signs BP (!) 143/71   Pulse 65   Temp  97.9 F (36.6 C)   Resp (!) 22   Ht 5' 2 (1.575 m)   Wt 72.6 kg   LMP  (LMP Unknown)   SpO2 92%   BMI 29.26 kg/m  Physical Exam Constitutional:      Appearance: She is well-developed.  HENT:     Head: Normocephalic and atraumatic.  Eyes:     Extraocular Movements: Extraocular movements intact.     Pupils: Pupils are equal, round, and reactive to light.     Comments: No nystagmus  Cardiovascular:     Rate and Rhythm: Regular rhythm. Bradycardia present.     Heart sounds: Normal heart sounds.  Pulmonary:     Effort: Pulmonary effort is normal. No respiratory distress.     Breath sounds: Normal breath sounds. No wheezing or rales.  Chest:     Chest wall: No tenderness.  Abdominal:     General: Bowel sounds are normal.     Palpations: Abdomen is soft.     Tenderness: There is no abdominal tenderness. There is no guarding or rebound.  Musculoskeletal:        General: Normal range of motion.     Cervical back: Normal range of motion and neck supple.     Comments: No edema or calf tenderness  Lymphadenopathy:     Cervical: No cervical adenopathy.  Skin:    General: Skin is warm and dry.      Findings: No rash.  Neurological:     General: No focal deficit present.     Mental Status: She is alert and oriented to person, place, and time.     ED Results / Procedures / Treatments   Labs (all labs ordered are listed, but only abnormal results are displayed) Labs Reviewed  BASIC METABOLIC PANEL - Abnormal; Notable for the following components:      Result Value   Sodium 132 (*)    Chloride 97 (*)    Glucose, Bld 198 (*)    BUN 24 (*)    All other components within normal limits  CBC - Abnormal; Notable for the following components:   Hemoglobin 11.7 (*)    HCT 35.9 (*)    All other components within normal limits  URINALYSIS, W/ REFLEX TO CULTURE (INFECTION SUSPECTED) - Abnormal; Notable for the following components:   Bacteria, UA RARE (*)    All other components within normal limits  BRAIN NATRIURETIC PEPTIDE - Abnormal; Notable for the following components:   B Natriuretic Peptide 722.0 (*)    All other components within normal limits  TROPONIN I (HIGH SENSITIVITY)  TROPONIN I (HIGH SENSITIVITY)    EKG EKG Interpretation Date/Time:  Wednesday February 16 2023 11:16:22 EST Ventricular Rate:  46 PR Interval:  147 QRS Duration:  164 QT Interval:  535 QTC Calculation: 468 R Axis:   31  Text Interpretation: Sinus bradycardia Right bundle branch block Baseline wander in lead(s) V2 Confirmed by Lenor Hollering [phone]) on 02/16/2023 11:23:43 AM  Radiology DG Chest 2 View Result Date: 02/16/2023 CLINICAL DATA:  Shortness of breath. EXAM: CHEST - 2 VIEW COMPARISON:  09/09/2022. FINDINGS: Low lung volume. Mild diffuse pulmonary vascular congestion, likely accentuated by low lung volume. No frank pulmonary edema. There is linear area of scarring/atelectasis overlying the left lower lung zone. Bilateral lung fields are otherwise clear. No acute consolidation or lung collapse. Bilateral costophrenic angles are clear. Normal cardio-mediastinal silhouette. There are surgical staples  along the heart border and sternotomy wires, status post CABG (  coronary artery bypass graft). No acute osseous abnormalities. Bilateral shoulder arthroplasty noted. The soft tissues are within normal limits. IMPRESSION: *Mild diffuse pulmonary vascular congestion. *No acute consolidation or lung collapse. Electronically Signed   By: Ree Molt M.D.   On: 02/16/2023 13:19    Procedures Procedures    Medications Ordered in ED Medications  furosemide  (LASIX ) injection 40 mg (40 mg Intravenous Given 02/16/23 1426)    ED Course/ Medical Decision Making/ A&P                                 Medical Decision Making Amount and/or Complexity of Data Reviewed Labs: ordered. Radiology: ordered.  Risk OTC drugs. Prescription drug management.   Patient is a 83 year old who presents with bradycardia.  She has been feeling a bit fatigued lately has had some increased shortness of breath.  She has a little bit of tightness across her chest as well.  However her EKG does not show any ischemic changes.  Her troponin is negative.  Has been fairly constant for about 2 weeks.  It somewhat reproducible on palpation of her chest wall.  Her heart rate is in the 40s but she is maintaining normal blood pressures.  Chest x-ray two-view showed evidence of pulmonary edema.  This was interpreted by me and confirmed by the radiologist.  Her BNP is elevated about 700.  Other labs are nonconcerning.  I discussed with Dr. Ladona with cardiology.  He recommended giving her Lasix .  She was given dose of IV Lasix .  He also recommended discontinuing her metoprolol  for now and having close follow-up with cardiology.  He will send a message to the office to arrange follow-up.  I discussed these findings with her.  We will keep her here for observation for another couple hours to make sure she diuresis and her heart rate stabilizes.  Her heart rate actually has been improving and now it is closer to 60.  If continues to do well,  we will plan on discharged with close cardiology follow-up.   Final Clinical Impression(s) / ED Diagnoses Final diagnoses:  Bradycardia  Acute pulmonary edema [facility])    Rx / DC Orders ED Discharge Orders     None         Lenor Hollering, MD 02/16/23 1508    Lenor Hollering, MD 02/16/23 [phone]

## 2023-02-16 NOTE — ED Notes (Signed)
 Patient transported to X-ray

## 2023-02-17 ENCOUNTER — Telehealth: Payer: Self-pay | Admitting: Cardiovascular Disease

## 2023-02-17 NOTE — Telephone Encounter (Signed)
 Patient states HR remains in 50's, she is tired, has no energy. She reports intermittent chest pain/pressure today. She states she is SOB with activity and at rest. She is using her nebulizer and inhalers as prescribed.  Patient reports a 10 lb weight gain in the past 1-2 weeks.  She was seen in ED yesterday and metoprolol  was discontinued. She received IV Lasix  for fluid overload. She has been urinating frequently since yesterday but states no improvement in symptoms. Symptoms are not worse, but not better either.  Encouraged patient to also contact pulmonologist regarding SOB. May require cardiology F/U prior to discussing further, but will be in the loop.  Patient states home health nurse saw her yesterday and BP was 174/63, HR 50. HR today 53, no BP reading.  Patient states she is also taking Lasix  20 mg every other day. Added to medication list.  Patient has appt with APP on 02/21/23. No sooner appts available. Advised patient to return to ED if symptoms do not improve, or worsen, over the next 4 days. Patient verbalized understanding.

## 2023-02-17 NOTE — Telephone Encounter (Signed)
 Called and left message for patient to return call to clinic.  If she is still not feeling better should make an appointment to be seen before prescribing meds.

## 2023-02-17 NOTE — Telephone Encounter (Signed)
 STAT if HR is under 50 or over 120 (normal HR is 60-100 beats per minute)  What is your heart rate? 53  Do you have a log of your heart rate readings (document readings)? No   Do you have any other symptoms? Very tired, slow, no energy, dizziness

## 2023-02-18 NOTE — Telephone Encounter (Signed)
 Spoke with patient, confirmed appointment on Monday, January 13 to see [name] Littler, NP at our [facility] office.

## 2023-02-19 NOTE — Progress Notes (Signed)
 Cardiology Office Note    Date:  02/21/2023  ID:  [name], DOB February 21, 1940, MRN [id number] PCP:  Geofm Glade PARAS, MD  Cardiologist:  Aleene Passe, MD  Electrophysiologist:  None   Chief Complaint: Bradycardia/shortness of breath   History of Present Illness: .    [name] is a 83 y.o. female with visit-pertinent history of chronic diastolic CHF, carotid artery disease, asthma, anxiety, CAD s/p CABG in 2009, hypertension, hyperlipidemia and COPD.  She presents today for ED follow-up.  Patient with history of CAD, s/p CABG in 2009 with LIMA to LAD, SVG to D1, SVG to RCA.  In 07/2021 she underwent LHC that showed normal left main and left circumflex, LAD with proximal 60% stenosis, D1 100% stenosis, RCA 25% proximal stenosis, her LIMA to LAD and SVG to D1 were both patent, her SVG to PDA was occluded.  Cath results overall unchanged from her LHC in 2018.  She was last seen in clinic by Dr. Passe on 10/30/2021 she continued to note intermittent chest pain and dyspnea.  Echocardiogram on 11/17/2021 indicated LVEF of greater than 75%, no RWMA, diastolic function was unable to be evaluated, RV systolic function and size was normal.  The left atrium was mildly dilated, there is mild mitral stenosis with severe mitral annular calcification.  Aortic valve sclerosis was present without any evidence of stenosis.  Patient was then lost to follow-up.  On 02/16/2023 she presented to the ED with low heart rate, she reported that she been feeling fatigued for the prior few days.  She also endorsed some lightheadedness.  Her home health nurse noted that her heart rate has been in the low 50s the day prior, when she got up that morning her heart rate was in the 40s.  She also noted feeling some pressure in her chest and neck for the prior 2 weeks that was constant and unchanged, not associate with exertion.  She also reported some slight shortness of breath attributed to her COPD.  She reported that she was  prone to UTIs.  Patient's BNP was elevated at 722, troponins were negative x 2.  Chest x-ray showed mild diffuse pulmonary vascular congestion.  Her case was discussed with Dr. Ladona, he recommended discontinuing her metoprolol  and diltiazem .  As well as giving a dose of IV Lasix .  Today she presents for follow-up.  She reports that she is doing slightly better.  She reports that her heart rate has improved, she notes that last night her heart rate was 70 bpm and this morning upon awakening she was at 80 bpm.  She denied any presyncope or syncope with decreased heart rate.  She also endorses increased shortness of breath in the last 2 to 3 weeks, she is increased her use of her inhalers without much improvement.  She also endorses left sided sharp chest pain that she notes has been present since her bypass surgery and is overall unchanged, she denies any new chest pain.  She denies any lower extremity edema although does note that she feels that her abdomen is slightly more swollen than normal.  Patient reports that her normal weight is around 170 pounds.  She notes that if she were required to lay flat she may have some slight discomfort in her breathing with this.  ROS: .   Today she denies chest pain, shortness of breath, lower extremity edema, fatigue, palpitations, melena, hematuria, hemoptysis, diaphoresis, weakness, presyncope, syncope, orthopnea, and PND.  All other systems are  reviewed and otherwise negative. Studies Reviewed: [name]   EKG:  EKG is ordered today, personally reviewed, demonstrating  EKG Interpretation Date/Time:  Monday February 21 2023 13:46:10 EST Ventricular Rate:  74 PR Interval:  140 QRS Duration:  122 QT Interval:  440 QTC Calculation: 488 R Axis:   33  Text Interpretation: Normal sinus rhythm Right bundle branch block SINCE LAST TRACING HEART RATE HAS INCREASED Confirmed by Annalysa Mohammad [phone]) on 02/21/2023 2:00:46 PM   CV Studies:  Cardiac Studies & Procedures   CARDIAC  CATHETERIZATION  CARDIAC CATHETERIZATION 07/28/2021  Narrative   Ost LAD to Prox LAD lesion is 60% stenosed.   1st Diag lesion is 100% stenosed.   Origin to Prox Graft lesion is 100% stenosed.   Prox RCA lesion is 25% stenosed.   Mid RCA lesion is 25% stenosed.   LV end diastolic pressure is normal.   The left ventricular ejection fraction is 55-65% by visual estimate.  The left main coronary artery is a long normal caliber vessel which bifurcates into an LAD and large left circumflex vessel.  The LAD has smooth 60% narrowing proximally.  There is no antegrade filling of the first diagonal vessel which had previously been felt to be occluded at the ostium.  Competitive filling is seen in the mid LAD due to competitive LIMA flow.  Left circumflex vessel is very large caliber and gives off a ramus intermediate like very proximal vessel.  The circumflex is tortuous but free of significant disease.  Native RCA is large caliber and has previously noted 25% proximal and mid 6 stenoses.  There is no competitive filling seen by the SVG.  Patent LIMA graft supplying the mid LAD  Widely patent SVG supplying the first diagonal branch of the LAD  Old total occlusion of the vein graft which had supplied the PDA.  Preserved global LV contractility with EF estimated at 60%.  LVEDP 16 mmHg.  Mildly elevated right heart pressures with mild pulmonary hypertension with mean PA pressure at 25 mm.  Cardiac output by the thermodilution method 3.5 and by the Fick method 4.9 L/min. Cardiac index by thermodilution method 1.9 and by the Fick method 2.7 L/min/m.  PVR: 3.0 WU  RECOMMENDATION: Medical therapy.  The catheterization findings are not significantly changed from the patient's last catheterization in 2018.  The SVG to RCA was occluded in 2018 most likely due to normal TIMI-3 flow down the native RCA.  The proximal LAD is smooth and seems improved.  It is possible that the first diagonal is not  occluded at the ostium since there is excellent filling to the ostium via the SVG.  Findings Coronary Findings Diagnostic  Dominance: Right  Left Anterior Descending Ost LAD to Prox LAD lesion is 60% stenosed.  First Diagonal Branch 1st Diag lesion is 100% stenosed.  Right Coronary Artery Prox RCA lesion is 25% stenosed. Mid RCA lesion is 25% stenosed.  Graft To 1st Diag  LIMA Graft To Mid LAD  Graft To RPDA Origin to Prox Graft lesion is 100% stenosed.  Intervention  No interventions have been documented.   CARDIAC CATHETERIZATION  CARDIAC CATHETERIZATION 12/14/2016  Narrative  Prox LAD lesion is 80% stenosed. LIMA to LAD is patent.  Prox RCA lesion is 25% stenosed. SVG to PDA is occluded.  Mid RCA lesion is 25% stenosed.  Circumflex is a large tortuous vessel.  Ost 1st Diag lesion is 100% stenosed. SVG to diagonal is patent.  The left ventricular systolic function is  normal.  LV end diastolic pressure is normal.  The left ventricular ejection fraction is 55-65% by visual estimate.  There is no aortic valve stenosis.  Continue aggressive medical therapy.  Findings Coronary Findings Diagnostic  Dominance: Right  Left Anterior Descending Prox LAD lesion is 80% stenosed.  First Diagonal Branch Ost 1st Diag lesion is 100% stenosed.  Left Circumflex Vessel is large.  Right Coronary Artery Prox RCA lesion is 25% stenosed. Mid RCA lesion is 25% stenosed.  Graft To RPAV Origin lesion is 100% stenosed.  LIMA Graft To Mid LAD  Graft To 1st Diag  Intervention  No interventions have been documented.   STRESS TESTS  MYOCARDIAL PERFUSION IMAGING 02/20/2020  Narrative  Nuclear stress EF: 59%.  The left ventricular ejection fraction is normal (55-65%).  There was no ST segment deviation noted during stress.  This is a low risk study.  No ischemia or infarction on perfusion imaging.  ECHOCARDIOGRAM  ECHOCARDIOGRAM COMPLETE  11/17/2021  Narrative ECHOCARDIOGRAM REPORT    Patient Name:   [name] Date of Exam: 11/17/2021 Medical Rec #:  [id number]     Height:       62.0 in Accession #:    [id number]    Weight:       175.0 lb Date of Birth:  1940-11-19     BSA:          1.806 m Patient Age:    81 years      BP:           110/80 mmHg Patient Gender: F             HR:           80 bpm. Exam Location:  [address]  Procedure: Color Doppler, Cardiac Doppler, 3D Echo and 2D Echo  Indications:    CAD I25.10  History:        Patient has prior history of Echocardiogram examinations, most recent 02/17/2017. CAD and Previous Myocardial Infarction, Prior CABG, COPD, Mitral Valve Disease, Signs/Symptoms:Chest Pain; Risk Factors:Diabetes, Hypertension and Dyslipidemia.  Sonographer:    Augustin Seals RDCS Referring Phys: GLADE PARAS BURNS  IMPRESSIONS   1. Left ventricular ejection fraction, by estimation, is >75%. The left ventricle has hyperdynamic function. The left ventricle has no regional wall motion abnormalities. Left ventricular diastolic function could not be evaluated. 2. Right ventricular systolic function is normal. The right ventricular size is normal. Tricuspid regurgitation signal is inadequate for assessing PA pressure. 3. Left atrial size was mildly dilated. 4. The mitral valve is normal in structure. No evidence of mitral valve regurgitation. Mild mitral stenosis. Severe mitral annular calcification. 5. The aortic valve is tricuspid. Aortic valve regurgitation is not visualized. Aortic valve sclerosis/calcification is present, without any evidence of aortic stenosis. 6. The inferior vena cava is normal in size with greater than 50% respiratory variability, suggesting right atrial pressure of 3 mmHg.  FINDINGS Left Ventricle: Left ventricular ejection fraction, by estimation, is >75%. The left ventricle has hyperdynamic function. The left ventricle has no regional wall motion abnormalities.  The left ventricular internal cavity size was normal in size. There is no left ventricular hypertrophy. Left ventricular diastolic function could not be evaluated due to mitral annular calcification (moderate or greater). Left ventricular diastolic function could not be evaluated.  Right Ventricle: The right ventricular size is normal. Right ventricular systolic function is normal. Tricuspid regurgitation signal is inadequate for assessing PA pressure. The tricuspid regurgitant velocity is 2.79 m/s, and with  an assumed right atrial pressure of 3 mmHg, the estimated right ventricular systolic pressure is 34.1 mmHg.  Left Atrium: Left atrial size was mildly dilated.  Right Atrium: Right atrial size was normal in size.  Pericardium: There is no evidence of pericardial effusion.  Mitral Valve: The mitral valve is normal in structure. Severe mitral annular calcification. No evidence of mitral valve regurgitation. Mild mitral valve stenosis. MV peak gradient, 13.2 mmHg. The mean mitral valve gradient is 5.0 mmHg.  Tricuspid Valve: The tricuspid valve is normal in structure. Tricuspid valve regurgitation is trivial. No evidence of tricuspid stenosis.  Aortic Valve: The aortic valve is tricuspid. Aortic valve regurgitation is not visualized. Aortic valve sclerosis/calcification is present, without any evidence of aortic stenosis.  Pulmonic Valve: The pulmonic valve was not well visualized. Pulmonic valve regurgitation is trivial. No evidence of pulmonic stenosis.  Aorta: The aortic root is normal in size and structure.  Venous: The inferior vena cava is normal in size with greater than 50% respiratory variability, suggesting right atrial pressure of 3 mmHg.  IAS/Shunts: No atrial level shunt detected by color flow Doppler.   LEFT VENTRICLE PLAX 2D LVIDd:         4.30 cm   Diastology LVIDs:         2.70 cm   LV e' medial:    7.51 cm/s LV PW:         0.90 cm   LV E/e' medial:  11.6 LV IVS:         0.80 cm   LV e' lateral:   9.36 cm/s LVOT diam:     1.90 cm   LV E/e' lateral: 9.3 LV SV:         62 LV SV Index:   35 LVOT Area:     2.84 cm  3D Volume EF: 3D EF:        64 % LV EDV:       89 ml LV ESV:       32 ml LV SV:        58 ml  RIGHT VENTRICLE RV Basal diam:  3.10 cm RV Mid diam:    3.50 cm TAPSE (M-mode): 1.6 cm  LEFT ATRIUM             Index        RIGHT ATRIUM           Index LA diam:        3.60 cm 1.99 cm/m   RA Area:     16.70 cm LA Vol (A2C):   64.5 ml 35.71 ml/m  RA Volume:   43.50 ml  24.08 ml/m LA Vol (A4C):   45.7 ml 25.30 ml/m LA Biplane Vol: 56.5 ml 31.28 ml/m AORTIC VALVE LVOT Vmax:   116.00 cm/s LVOT Vmean:  73.200 cm/s LVOT VTI:    0.220 m  AORTA Ao Root diam: 3.40 cm  MITRAL VALVE                TRICUSPID VALVE MV Area (PHT): 3.06 cm     TR Peak grad:   31.1 mmHg MV Area VTI:   1.50 cm     TR Vmax:        279.00 cm/s MV Peak grad:  13.2 mmHg MV Mean grad:  5.0 mmHg     SHUNTS MV Vmax:       1.82 m/s     Systemic VTI:  0.22 m MV Vmean:  104.0 cm/s   Systemic Diam: 1.90 cm MV Decel Time: 248 msec MV E velocity: 87.30 cm/s MV A velocity: 162.00 cm/s MV E/A ratio:  0.54  Redell Shallow MD Electronically signed by Redell Shallow MD Signature Date/Time: 11/17/2021/5:21:44 PM    Final   MONITORS  CARDIAC EVENT MONITOR 01/24/2017  Narrative  NSR  No significant arrhythmias to explain syncope and dizzy symptoms             Current Reported Medications:.    Current Meds  Medication Sig   albuterol  (PROVENTIL  HFA;VENTOLIN  HFA) 108 (90 BASE) MCG/ACT inhaler Inhale 2 puffs into the lungs every 6 (six) hours as needed. For shortness of breath.   albuterol  (PROVENTIL ) (2.5 MG/3ML) 0.083% nebulizer solution Take 3 mLs (2.5 mg total) by nebulization every 6 (six) hours as needed for wheezing or shortness of breath.   aspirin  EC 81 MG tablet Take 1 tablet (81 mg total) by mouth daily.   Cholecalciferol  (VITAMIN D ) 50 MCG  (2000 UT) tablet Take 4,000 Units by mouth daily.   Continuous Blood Gluc Receiver (FREESTYLE LIBRE 2 READER) DEVI 1 Device by Does not apply route every 14 (fourteen) days.   Continuous Blood Gluc Sensor (FREESTYLE LIBRE 2 SENSOR) MISC 1 Device by Does not apply route every 14 (fourteen) days.   Cyanocobalamin  (VITAMIN B-12 IJ) Inject 1,000 mcg as directed every 30 (thirty) days.   diltiazem  (CARDIZEM  CD) 240 MG 24 hr capsule Take 1 capsule (240 mg total) by mouth daily.   divalproex  (DEPAKOTE  ER) 250 MG 24 hr tablet TAKE 1 TABLET (250 MG TOTAL) BY MOUTH AT BEDTIME.   fluticasone  (FLONASE ) 50 MCG/ACT nasal spray SPRAY 2 SPRAYS INTO EACH NOSTRIL EVERY DAY (Patient taking differently: Place 2 sprays into both nostrils daily as needed for allergies.)   Fluticasone -Umeclidin-Vilant (TRELEGY ELLIPTA ) 200-62.5-25 MCG/ACT AEPB Inhale 1 puff into the lungs daily at 6 (six) AM.   glucose blood test strip Use as instructed to test blood sugars up to 4 times daily   insulin  lispro protamine-lispro (HUMALOG  75/25 MIX) (75-25) 100 UNIT/ML SUSP injection Inject 18 Units into the skin 2 (two) times daily with a meal. 10 units at lunch depending on bs reading (Patient taking differently: Inject 6 Units into the skin See admin instructions. Inject 6  units under the skin at breakfast and 6 units in the evening  PT does not takes if has not eaten)   Insulin  Syringe-Needle U-100 (INSULIN  SYRINGE .3CC/29GX1) 29G X 1 0.3 ML MISC 1 Device by Does not apply route in the morning and at bedtime.   isosorbide  mononitrate (IMDUR ) 60 MG 24 hr tablet Take one tablet ( 60 mg ) twice daily 8 hours apart.   meclizine  (ANTIVERT ) 25 MG tablet Take 25 mg by mouth 2 (two) times daily as needed for dizziness.   meloxicam (MOBIC) 15 MG tablet Take 15 mg by mouth daily.   Multiple Vitamin (MULTIVITAMIN WITH MINERALS) TABS Take 1 tablet by mouth daily.   neomycin-polymyxin b -dexamethasone  (MAXITROL) 3.5-10000-0.1 SUSP SMARTSIG:In Eye(s)    nitroGLYCERIN  (NITROSTAT ) 0.4 MG SL tablet Place 1 tablet (0.4 mg total) under the tongue every 5 (five) minutes as needed for chest pain.   pantoprazole  (PROTONIX ) 40 MG tablet Take 1 tablet (40 mg total) by mouth daily.   sertraline  (ZOLOFT ) 25 MG tablet Take 1 tablet (25 mg total) by mouth daily. After 2 weeks increase to 50 mg daily   spironolactone  (ALDACTONE ) 25 MG tablet Take 1 tablet (25 mg total)  by mouth daily.   temazepam  (RESTORIL ) 30 MG capsule Take 1 capsule (30 mg total) by mouth daily. (Patient taking differently: Take 30 mg by mouth at bedtime.)   [DISCONTINUED] furosemide  (LASIX ) 20 MG tablet Take 20 mg by mouth daily. Take 40 mg for the next 3 days then take 20 mg once a day.   Current Facility-Administered Medications for the 02/21/23 encounter (Office Visit) with Breken Nazari D, NP  Medication   Study - ORION 4 - inclisiran 300 mg/1.25mL or placebo SQ injection ([facility])    Physical Exam:    VS:  BP 132/70   Pulse 74   Ht 5' 2 (1.575 m)   Wt 174 lb 3.2 oz (79 kg)   LMP  (LMP Unknown)   SpO2 96%   BMI 31.86 kg/m    Wt Readings from Last 3 Encounters:  02/21/23 174 lb 3.2 oz (79 kg)  02/16/23 160 lb (72.6 kg)  01/24/23 170 lb (77.1 kg)    GEN: Well nourished, well developed in no acute distress NECK: No JVD; No carotid bruits CARDIAC: RRR, no murmurs, rubs, gallops RESPIRATORY:  Clear to auscultation without rales, wheezing or rhonchi  ABDOMEN: Soft, non-tender, non-distended EXTREMITIES:  No edema; No acute deformity   Asessement and Plan:.    Bradycardia: Patient presented to the ED on 1/8 was 23 with low heart rate, on arrival her heart rate was in the 40s.  Her metoprolol  succinate was discontinued.  She has been continued on Cardizem  240 mg daily. She denies any presyncope or syncope.  She notes that when her heart rate was lower she was more fatigued. Today she reports that her heart rate has improved, she has been in the 70s and 80s the last 2 days.   Discussed decreasing her Cardizem , however through shared decision making elected to continue.  She will wear a 1 week ZIO monitor to monitor for high degree blocks.  Reviewed ED precautions.    Diastolic dysfunction/shortness of breath: When she presented to the ED on 02/16/2023 she reported increased shortness of breath, BNP was 722, she was given IV Lasix .  She has not noted significant improvement in her breathing since IV Lasix , she notes that she is only on Lasix  20 mg every other day.  She denies lower extremity edema although does note that her abdomen feels slightly more swollen, she has noted a few pounds weight gain, no more than 3 pounds in the last week.  She notes some possible orthopnea although notes that she is never able to lay down flat due to sleep apnea. Reviewed ED precautions. Will increase her Lasix  to 40 mg daily for 3 days then 20 mg daily thereafter.  Check BMET in 1 week. Check echocardiogram.   CAD:  s/p CABG in 2009 with LIMA to LAD, SVG to D1, SVG to RCA.  In 07/2021 she underwent LHC that showed normal left main and left circumflex, LAD with proximal 60% stenosis, D1 100% stenosis, RCA 25% proximal stenosis, her LIMA to LAD and SVG to D1 were both patent, her SVG to PDA was occluded.  Cath results overall unchanged from her LHC in 2018.  Patient has reported intermittent left-sided sharp chest discomfort since her CABG, she notes that this has not significantly changed.  Will check echocardiogram as noted above.  Reviewed ED precautions.  Continue aspirin  81 mg daily, Cardizem  240 mg daily, Lasix  as noted above, Imdur  60 mg twice daily, spironolactone  25 mg daily.  COPD: Followed by pulmonology.  She notes that she has been using her inhalers as prescribed and has not noted significant improvement in her breathing.  Will increase Lasix  as noted above also encouraged that she follow-up with her pulmonologist.  Hyperlipidemia: No recent lipid panel on file.  Consider checking  fasting lipid profile on follow-up.    Disposition: F/u with Dr. Alveta or Talli Kimmer, NP in six weeks or sooner if needed   Signed, Winfred Iiams D Cailean Heacock, NP

## 2023-02-21 ENCOUNTER — Ambulatory Visit: Payer: Medicare Other | Attending: Cardiology | Admitting: Cardiology

## 2023-02-21 ENCOUNTER — Encounter: Payer: Self-pay | Admitting: Cardiology

## 2023-02-21 ENCOUNTER — Ambulatory Visit: Payer: Medicare Other | Attending: Cardiology

## 2023-02-21 VITALS — BP 132/70 | HR 74 | Ht 62.0 in | Wt 174.2 lb

## 2023-02-21 DIAGNOSIS — I2510 Atherosclerotic heart disease of native coronary artery without angina pectoris: Secondary | ICD-10-CM | POA: Diagnosis not present

## 2023-02-21 DIAGNOSIS — R0602 Shortness of breath: Secondary | ICD-10-CM

## 2023-02-21 DIAGNOSIS — R001 Bradycardia, unspecified: Secondary | ICD-10-CM

## 2023-02-21 DIAGNOSIS — I10 Essential (primary) hypertension: Secondary | ICD-10-CM

## 2023-02-21 DIAGNOSIS — I5032 Chronic diastolic (congestive) heart failure: Secondary | ICD-10-CM | POA: Diagnosis not present

## 2023-02-21 MED ORDER — FUROSEMIDE 20 MG PO TABS: 20.0000 mg | ORAL_TABLET | Freq: Every day | ORAL | 3 refills | Status: AC

## 2023-02-21 NOTE — Progress Notes (Unsigned)
 Enrolled for [employer] to mail a ZIO XT long term holter monitor to the patients address on file.   Dr. Elease Hashimoto to read.

## 2023-02-21 NOTE — Patient Instructions (Addendum)
 Medication Instructions:  Start taking lasix  40 mg once a day for 3 days and then take 20 mg once a day. *If you need a refill on your cardiac medications before your next appointment, please call your pharmacy*  Lab Work: In 1 week we will need to draw a non fasting Bmet If you have labs (blood work) drawn today and your tests are completely normal, you will receive your results only by: MyChart Message (if you have MyChart) OR A paper copy in the mail If you have any lab test that is abnormal or we need to change your treatment, we will call you to review the results.  Testing/Procedures: Your physician has requested that you have an echocardiogram. Echocardiography is a painless test that uses sound waves to create images of your heart. It provides your doctor with information about the size and shape of your heart and how well your heart's chambers and valves are working. This procedure takes approximately one hour. There are no restrictions for this procedure. Please do NOT wear cologne, perfume, aftershave, or lotions (deodorant is allowed). Please arrive 15 minutes prior to your appointment time.  Please note: We ask at that you not bring children with you during ultrasound (echo/ vascular) testing. Due to room size and safety concerns, children are not allowed in the ultrasound rooms during exams. Our front office staff cannot provide observation of children in our lobby area while testing is being conducted. An adult accompanying a patient to their appointment will only be allowed in the ultrasound room at the discretion of the ultrasound technician under special circumstances. We apologize for any inconvenience.   ZIO XT- Long Term Monitor Instructions  Your physician has requested you wear a ZIO patch monitor for 7 days.  This is a single patch monitor. [employer] supplies one patch monitor per enrollment. Additional stickers are not available. Please do not apply patch if you will be  having a Nuclear Stress Test,  Echocardiogram, Cardiac CT, MRI, or Chest Xray during the period you would be wearing the  monitor. The patch cannot be worn during these tests. You cannot remove and re-apply the  ZIO XT patch monitor.  Your ZIO patch monitor will be mailed 3 day USPS to your address on file. It may take 3-5 days  to receive your monitor after you have been enrolled.  Once you have received your monitor, please review the enclosed instructions. Your monitor  has already been registered assigning a specific monitor serial # to you.  Billing and Patient Assistance Program Information  We have supplied [employer] with any of your insurance information on file for billing purposes. [employer] offers a sliding scale Patient Assistance Program for patients that do not have  insurance, or whose insurance does not completely cover the cost of the ZIO monitor.  You must apply for the Patient Assistance Program to qualify for this discounted rate.  To apply, please call [employer] at [phone], select option 4, select option 2, ask to apply for  Patient Assistance Program. Meredeth will ask your household income, and how many people  are in your household. They will quote your out-of-pocket cost based on that information.  [employer] will also be able to set up a 47-month, interest-free payment plan if needed.  Applying the monitor   Shave hair from upper left chest.  Hold abrader disc by orange tab. Rub abrader in 40 strokes over the upper left chest as  indicated in your monitor instructions.  Clean area  with 4 enclosed alcohol pads. Let dry.  Apply patch as indicated in monitor instructions. Patch will be placed under collarbone on left  side of chest with arrow pointing upward.  Rub patch adhesive wings for 2 minutes. Remove white label marked 1. Remove the white  label marked 2. Rub patch adhesive wings for 2 additional minutes.  While looking in a mirror, press and release button  in center of patch. A small green light will  flash 3-4 times. This will be your only indicator that the monitor has been turned on.  Do not shower for the first 24 hours. You may shower after the first 24 hours.  Press the button if you feel a symptom. You will hear a small click. Record Date, Time and  Symptom in the Patient Logbook.  When you are ready to remove the patch, follow instructions on the last 2 pages of Patient  Logbook. Stick patch monitor onto the last page of Patient Logbook.  Place Patient Logbook in the blue and white box. Use locking tab on box and tape box closed  securely. The blue and white box has prepaid postage on it. Please place it in the mailbox as  soon as possible. Your physician should have your test results approximately 7 days after the  monitor has been mailed back to [facility].  Call [facility] Customer Care at [phone] if you have questions regarding  your ZIO XT patch monitor. Call them immediately if you see an orange light blinking on your  monitor.  If your monitor falls off in less than 4 days, contact our Monitor department at [phone].  If your monitor becomes loose or falls off after 4 days call [employer] at [phone] for  suggestions on securing your monitor Follow-Up: At [facility], you and your health needs are our priority.  As part of our continuing mission to provide you with exceptional heart care, we have created designated Provider Care Teams.  These Care Teams include your primary Cardiologist (physician) and Advanced Practice Providers (APPs -  Physician Assistants and Nurse Practitioners) who all work together to provide you with the care you need, when you need it.  We recommend signing up for the patient portal called MyChart.  Sign up information is provided on this After Visit Summary.  MyChart is used to connect with patients for Virtual Visits (Telemedicine).  Patients are able to view lab/test  results, encounter notes, upcoming appointments, etc.  Non-urgent messages can be sent to your provider as well.   To learn more about what you can do with MyChart, go to [email or web address].    Your next appointment:   6 week(s)  Provider:   Aleene Passe, MD    Other Instructions

## 2023-02-23 ENCOUNTER — Ambulatory Visit: Payer: Medicare Other | Admitting: Internal Medicine

## 2023-02-23 NOTE — Progress Notes (Deleted)
Name: [name]  MRN/ DOB: [id number], 12-Feb-1940   Age/ Sex: 83 y.o., female    PCP: Pincus Sanes, MD   Reason for Endocrinology Evaluation: Type 1 Diabetes Mellitus     Date of Initial Endocrinology Visit: 12/18/2021    PATIENT IDENTIFIER: [name] is a 83 y.o. female with a past medical history of DM, CAD and OSA. The patient presented for initial endocrinology clinic visit on 12/18/2021  for consultative assistance with her diabetes management.    HPI: [name] was    Diagnosed with DM 1986 Prior Medications tried/Intolerance: Metformin- Vomiting  Hemoglobin A1c has ranged from 6.9% in 2009, peaking at 9.5% in 2023.   Used to follow-up with Dr. Talmage Nap   On her initial visit to our clinic her A1c was 6.9%, she was taking Humalog Mix 3 times daily, this was adjusted to twice daily    SUBJECTIVE:   During the last visit (10/05/2022): A1c 7.3%    Today (02/23/23): [name] is here for a follow up on diabetes management. She checks her blood sugars 3-4 times daily through the freestyle libre, did not bring the receiver again today. The pt endorses hypoglycemia , she is symptoms with these symptomatic   Patient is under the care of cardiology for evaluation of bradycardia  HOME DIABETES REGIMEN: Humalog mix 6 units With breakfast,  and 6 units with Supper       METER DOWNLOAD SUMMARY: n/a     DIABETIC COMPLICATIONS: Microvascular complications:   Denies: CKD, retinopathy Last eye exam: Completed 11/2021  Macrovascular complications:  CAD Denies:  PVD, CVA   PAST HISTORY: Past Medical History:  Past Medical History:  Diagnosis Date   Adenomatous colon polyp    Allergy    Anxiety    Arthritis    Asthma    Carotid artery disease (HCC)    carotid US 02/2017: bilat ICA 1-39%   Chronic diastolic CHF    Echo 02/2017: EF 65-70, Gr 2 DD, mild MS (mean 5), PASP 44   COPD    pt is unsure if has been officially diagnosed   Coronary  artery disease    CABG '09- cathed 12/09, 9/10, 6/11, 3/14 and 12/13/16- medical Rx // cath 12/2016 - 2/3 grafts patent >> med Rx // Myoview 12/17: low risk   Diabetes mellitus    Dyspnea    with exertion   Gastroesophageal reflux disease    Headache    Hiatal hernia    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   Hyperlipidemia    Hypertension    Myocardial infarction (HCC)    PONV (postoperative nausea and vomiting)    Schatzki's ring    Shoulder injury    resolved after shoulder surgery   Sleep apnea    not on cpap   Past Surgical History:  Past Surgical History:  Procedure Laterality Date   ABDOMINAL HYSTERECTOMY     APPENDECTOMY     came out with Hysterectomy   CARDIAC CATHETERIZATION  07/23/2009   EF 60%   CARDIAC CATHETERIZATION  10/11/2008   CARDIAC CATHETERIZATION  03/01/2007   EF 75-80%   CARDIOVASCULAR STRESS TEST  11/15/2007   EF 60%   COLONOSCOPY     CORONARY ARTERY BYPASS GRAFT     SEVERELY DISEASED SAPHENOUS VEIN GRAFT TO THE RIGHT CORONARY ARTERY BUT WITH FAIRLY WELL PRESERVED FLOW TO THE DISTAL RIGHT CORONARY ARTERY FROM THE NATIVE CIRCULATION-RESTART  CATH IN  JUNE 2000, REVEALS MILD/MODERATE  CAD WITH GOOD FLOW DOWN HER LAD   ESOPHAGOGASTRODUODENOSCOPY     EYE SURGERY     bilateral cataract surgery with lens implant   LEFT HEART CATH AND CORS/GRAFTS ANGIOGRAPHY N/A 12/14/2016   Procedure: LEFT HEART CATH AND CORS/GRAFTS ANGIOGRAPHY;  Surgeon: Corky Crafts, MD;  Location: [facility];  Service: Cardiovascular;  Laterality: N/A;   lense removal Left    POLYPECTOMY     REVERSE SHOULDER ARTHROPLASTY Left 06/25/2022   Procedure: REVERSE SHOULDER ARTHROPLASTY;  Surgeon: Beverely Low, MD;  Location: [facility];  Service: Orthopedics;  Laterality: Left;  general and choice with interscalene block   RIGHT/LEFT HEART CATH AND CORONARY/GRAFT ANGIOGRAPHY N/A 07/28/2021   Procedure: RIGHT/LEFT HEART CATH AND CORONARY/GRAFT ANGIOGRAPHY;  Surgeon: Lennette Bihari,  MD;  Location: [facility];  Service: Cardiovascular;  Laterality: N/A;   ROTATOR CUFF REPAIR     right and left   TONSILLECTOMY     age 47   TOTAL KNEE ARTHROPLASTY Right 02/20/2014   Procedure: RIGHT TOTAL KNEE ARTHROPLASTY;  Surgeon: Jacki Cones, MD;  Location: [facility];  Service: Orthopedics;  Laterality: Right;   tumor removed kidney     UPPER GASTROINTESTINAL ENDOSCOPY     US ECHOCARDIOGRAPHY  03/08/2008   EF 55-60%    Social History:  reports that she quit smoking about 47 years ago. Her smoking use included cigarettes. She started smoking about 68 years ago. She has a 10.5 pack-year smoking history. She has never used smokeless tobacco. She reports that she does not drink alcohol and does not use drugs. Family History:  Family History  Problem Relation Age of Onset   Heart disease Maternal Grandfather    Heart failure Maternal Grandfather    Diabetes Maternal Grandfather    Heart attack Father    Diabetes Mother    Rheum arthritis Sister    Emphysema Paternal Uncle    Esophageal cancer Brother 32       she said he was born with it   Emphysema Paternal Aunt    Healthy Child    Neuropathy Neg Hx    Multiple sclerosis Neg Hx    Colon cancer Neg Hx    Colon polyps Neg Hx    Rectal cancer Neg Hx    Stomach cancer Neg Hx      HOME MEDICATIONS: Allergies as of 02/23/2023       Reactions   Amlodipine Other (See Comments)   hallucinations    Banana Nausea And Vomiting   Stomach pumped   Co Q10 [coenzyme Q10] Other (See Comments)   Body cramps   Codeine Other (See Comments)   Hallucinate, loose identity and don't know who I am   Insulin Aspart (human Analog) Other (See Comments)   Unknown   Lisinopril Other (See Comments)   nose bleed   Metformin And Related Nausea And Vomiting   Morphine Other (See Comments)   Can not function, it immobilizes me    Pentazocine Nausea And Vomiting   Pravastatin Other (See Comments)   Hands locked up   Repatha  [evolocumab] Other (See Comments)   myalgias   Statins Other (See Comments)   Muscle cramps   Sulfa Antibiotics Swelling   Sulfonamide Derivatives Swelling   Tramadol Hcl Other (See Comments)   Dizziness, cant function         Medication List        Accurate as of February 23, 2023  7:32 AM. If you have any questions, ask your nurse or doctor.          acetaminophen 325 MG tablet Commonly known as: TYLENOL Take 650 mg by mouth every 6 (six) hours as needed for moderate pain or headache.   albuterol 108 (90 Base) MCG/ACT inhaler Commonly known as: VENTOLIN HFA Inhale 2 puffs into the lungs every 6 (six) hours as needed. For shortness of breath.   albuterol (2.5 MG/3ML) 0.083% nebulizer solution Commonly known as: PROVENTIL Take 3 mLs (2.5 mg total) by nebulization every 6 (six) hours as needed for wheezing or shortness of breath.   aspirin EC 81 MG tablet Take 1 tablet (81 mg total) by mouth daily.   benzonatate 200 MG capsule Commonly known as: TESSALON Take 1 capsule (200 mg total) by mouth 3 (three) times daily as needed for cough.   calcium carbonate 500 MG chewable tablet Commonly known as: TUMS - dosed in mg elemental calcium Chew 1 tablet by mouth daily as needed for indigestion or heartburn.   diltiazem 240 MG 24 hr capsule Commonly known as: CARDIZEM CD Take 1 capsule (240 mg total) by mouth daily.   divalproex 250 MG 24 hr tablet Commonly known as: DEPAKOTE ER TAKE 1 TABLET (250 MG TOTAL) BY MOUTH AT BEDTIME.   fluticasone 50 MCG/ACT nasal spray Commonly known as: FLONASE SPRAY 2 SPRAYS INTO EACH NOSTRIL EVERY DAY What changed: See the new instructions.   FreeStyle Libre 2 Reader Devi 1 Device by Does not apply route every 14 (fourteen) days.   FreeStyle Libre 2 Sensor Misc 1 Device by Does not apply route every 14 (fourteen) days.   furosemide 20 MG tablet Commonly known as: LASIX Take 1 tablet (20 mg total) by mouth daily. Take 40 mg for the  next 3 days then take 20 mg once a day.   glucose blood test strip Use as instructed to test blood sugars up to 4 times daily   insulin lispro protamine-lispro (75-25) 100 UNIT/ML Susp injection Commonly known as: HUMALOG 75/25 MIX Inject 18 Units into the skin 2 (two) times daily with a meal. 10 units at lunch depending on bs reading What changed:  how much to take when to take this additional instructions   INSULIN SYRINGE .3CC/29GX1" 29G X 1" 0.3 ML Misc 1 Device by Does not apply route in the morning and at bedtime.   isosorbide mononitrate 60 MG 24 hr tablet Commonly known as: IMDUR Take one tablet ( 60 mg ) twice daily 8 hours apart.   meclizine 25 MG tablet Commonly known as: ANTIVERT Take 25 mg by mouth 2 (two) times daily as needed for dizziness.   meloxicam 15 MG tablet Commonly known as: MOBIC Take 15 mg by mouth daily.   multivitamin with minerals Tabs tablet Take 1 tablet by mouth daily.   neomycin-polymyxin b-dexamethasone 3.5-10000-0.1 Susp Commonly known as: MAXITROL SMARTSIG:In Eye(s)   nitroGLYCERIN 0.4 MG SL tablet Commonly known as: NITROSTAT Place 1 tablet (0.4 mg total) under the tongue every 5 (five) minutes as needed for chest pain.   ondansetron 4 MG tablet Commonly known as: ZOFRAN TAKE 1 TABLET BY MOUTH EVERY 8 HOURS AS NEEDED FOR NAUSEA AND VOMITING   pantoprazole 40 MG tablet Commonly known as: PROTONIX Take 1 tablet (40 mg total) by mouth daily.   sertraline 25 MG tablet Commonly known as: ZOLOFT Take 1 tablet (25 mg total) by mouth daily. After 2 weeks increase to 50 mg daily  spironolactone 25 MG tablet Commonly known as: ALDACTONE Take 1 tablet (25 mg total) by mouth daily.   temazepam 30 MG capsule Commonly known as: RESTORIL Take 1 capsule (30 mg total) by mouth daily. What changed: when to take this   Trelegy Ellipta 200-62.5-25 MCG/ACT Aepb Generic drug: Fluticasone-Umeclidin-Vilant Inhale 1 puff into the lungs daily  at 6 (six) AM.   VITAMIN B-12 IJ Inject 1,000 mcg as directed every 30 (thirty) days.   Vitamin D 50 MCG (2000 UT) tablet Take 4,000 Units by mouth daily.         ALLERGIES: Allergies  Allergen Reactions   Amlodipine Other (See Comments)    hallucinations    Banana Nausea And Vomiting    Stomach pumped   Co Q10 [Coenzyme Q10] Other (See Comments)    Body cramps   Codeine Other (See Comments)    Hallucinate, loose identity and don't know who I am   Insulin Aspart (Human Analog) Other (See Comments)    Unknown   Lisinopril Other (See Comments)    nose bleed   Metformin And Related Nausea And Vomiting   Morphine Other (See Comments)    Can not function, it immobilizes me    Pentazocine Nausea And Vomiting   Pravastatin Other (See Comments)    Hands locked up   Repatha [Evolocumab] Other (See Comments)    myalgias   Statins Other (See Comments)    Muscle cramps   Sulfa Antibiotics Swelling   Sulfonamide Derivatives Swelling   Tramadol Hcl Other (See Comments)    Dizziness, cant function      REVIEW OF SYSTEMS: A comprehensive ROS was conducted with the patient and is negative except as per HPI     OBJECTIVE:   VITAL SIGNS: LMP  (LMP Unknown)    PHYSICAL EXAM:  General: Pt appears well and is in NAD  Neck: General: Supple without adenopathy or carotid bruits. Thyroid: Thyroid size normal.  No goiter or nodules appreciated.   Lungs: Clear with good BS bilat   Heart: RRR   Extremities:  Lower extremities - No pretibial edema.  Pt with tenderness at the right 2/3 MTH   Neuro: MS is good with appropriate affect, pt is alert and Ox3    Dm Foot Exam  10/05/2022   The skin of the feet is intact without sores or ulcerations. The pedal pulses are 2+ on right and 2+ on left. The sensation is intact to a screening 5.07, 10 gram monofilament bilaterally   DATA REVIEWED:  Lab Results  Component Value Date   HGBA1C 7.3 (A) 10/05/2022   HGBA1C 6.6 (H)  06/09/2022   HGBA1C 6.5 (H) 03/31/2022     Latest Reference Range & Units 02/16/23 12:02  Sodium 135 - 145 mmol/L 132 (L)  Potassium 3.5 - 5.1 mmol/L 4.1  Chloride 98 - 111 mmol/L 97 (L)  CO2 22 - 32 mmol/L 30  Glucose 70 - 99 mg/dL 657 (H)  BUN 8 - 23 mg/dL 24 (H)  Creatinine 8.46 - 1.00 mg/dL 9.62  Calcium 8.9 - 95.2 mg/dL 9.0  Anion gap 5 - 15  5  GFR, Estimated >60 mL/min >60  (L): Data is abnormally low (H): Data is abnormally high  Old records , labs and images have been reviewed.   ASSESSMENT / PLAN / RECOMMENDATIONS:   1) Type 1 Diabetes Mellitus, Optimaly controlled, With macrovascular complications - Most recent A1c of 7.3 %. Goal A1c < 7.5 %.     -  Patient with history of recurrent severe hypoglycemic episodes       MEDICATIONS: Continue Humalog mix 6 units before breakfast and 6 units before supper  EDUCATION / INSTRUCTIONS: BG monitoring instructions: Patient is instructed to check her blood sugars 3 times a day, before each meal. Call [facility] if: BG persistently < 70  I reviewed the Rule of 15 for the treatment of hypoglycemia in detail with the patient. Literature supplied.   2) Diabetic complications:  Eye: Does not have known diabetic retinopathy.  Neuro/ Feet: Does not have known diabetic peripheral neuropathy. Renal: Patient does not have known baseline CKD. She is not on an ACEI/ARB at present.    Follow-up in 6 months    Signed electronically by: Lyndle Herrlich, MD  [facility]  [facility] [address] [place], [place] [address] Phone: [phone] FAX: [phone]   CC: Pincus Sanes, MD [address] [place] [address] Phone: [phone]  Fax: [phone]    Return to Endocrinology clinic as below: Future Appointments  Date Time Provider Department Center  02/23/2023  1:20 PM Amiyrah Lamere, Konrad Dolores, MD [facility] None  03/29/2023 11:00 AM  Pincus Sanes, MD [facility] None  03/30/2023  1:00 PM [facility] 2 [facility]  05/03/2023  1:20 PM Nahser, Deloris Ping, MD [facility]  05/23/2023  2:10 PM Drema Dallas, DO [facility] None  06/22/2023 11:00 AM [facility] None

## 2023-02-25 DIAGNOSIS — R001 Bradycardia, unspecified: Secondary | ICD-10-CM | POA: Diagnosis not present

## 2023-03-01 ENCOUNTER — Telehealth: Payer: Self-pay

## 2023-03-01 LAB — BASIC METABOLIC PANEL
BUN/Creatinine Ratio: 30 — ABNORMAL HIGH (ref 12–28)
BUN: 30 mg/dL — ABNORMAL HIGH (ref 8–27)
CO2: 26 mmol/L (ref 20–29)
Calcium: 9.6 mg/dL (ref 8.7–10.3)
Chloride: 101 mmol/L (ref 96–106)
Creatinine, Ser: 1.01 mg/dL — ABNORMAL HIGH (ref 0.57–1.00)
Glucose: 153 mg/dL — ABNORMAL HIGH (ref 70–99)
Potassium: 5.1 mmol/L (ref 3.5–5.2)
Sodium: 140 mmol/L (ref 134–144)
eGFR: 55 mL/min/{1.73_m2} — ABNORMAL LOW (ref >59)

## 2023-03-01 NOTE — Telephone Encounter (Signed)
Left message to call back  

## 2023-03-01 NOTE — Telephone Encounter (Signed)
-----   Message from Rip Harbour sent at 03/01/2023  7:56 AM EST ----- Please let [name] know that she had a very slight decrease in kidney function with increased lasix. Her potassium level is normal. Continue current medications and follow up as planned.

## 2023-03-03 NOTE — Telephone Encounter (Signed)
-----   Message from Rip Harbour sent at 03/01/2023  7:56 AM EST ----- Please let [name] know that she had a very slight decrease in kidney function with increased lasix. Her potassium level is normal. Continue current medications and follow up as planned.

## 2023-03-03 NOTE — Telephone Encounter (Signed)
Left message to call back  

## 2023-03-04 NOTE — Telephone Encounter (Signed)
Patient identification verified by 2 forms. Marilynn Rail, RN    Called and spoke to patient  Relayed result message below  Patient verbalized understanding, no questions at this time

## 2023-03-04 NOTE — Telephone Encounter (Signed)
Pt was returning [name]'s call and is requesting a callback from another nurse since he's out the office. Please advise

## 2023-03-16 ENCOUNTER — Telehealth: Payer: Self-pay | Admitting: *Deleted

## 2023-03-16 NOTE — Telephone Encounter (Signed)
   Pre-operative Risk Assessment    Patient Name: [name]  DOB: May 20, 1940 MRN: [id number]   Date of last office visit: 02/21/23 ROSABEL MOSE, NP Date of next office visit: 05/03/23 DR. NAHSER   Request for Surgical Clearance    Procedure:  LEFT CARPAL TUNNEL RELEASE   Date of Surgery:  Clearance TBD                                Surgeon:  DR. CHARLES BENFIELD Surgeon's Group or Practice Name:  [facility] Phone number:  [phone] ATTN: KERRI MAZE Fax number:  [phone]   Type of Clearance Requested:   - Medical  - Pharmacy:  Hold Clopidogrel  (Plavix )     Type of Anesthesia:  MAC WITH LOCAL   Additional requests/questions:    Bonney Niels Jest   03/16/2023, 9:50 AM

## 2023-03-16 NOTE — Telephone Encounter (Signed)
   Name: MIRZA KIDNEY  DOB: Jun 24, 1940  MRN: [id number]  Primary Cardiologist: Aleene Passe, MD  Chart reviewed as part of pre-operative protocol coverage. The patient has an upcoming visit scheduled with Dr. Passe on 05/03/2023 at which time clearance can be addressed in case there are any issues that would impact surgical recommendations.  I added preop FYI to appointment note so that provider is aware to address at time of outpatient visit.  Per office protocol the cardiology provider should forward their finalized clearance decision and recommendations regarding antiplatelet therapy to the requesting party below.    I will route this message as FYI to requesting party and remove this message from the preop box as separate preop APP input not needed at this time.   Please call with any questions.  Wyn Raddle, Jackee Shove, NP  03/16/2023, 10:02 AM

## 2023-03-17 ENCOUNTER — Encounter: Payer: Self-pay | Admitting: Internal Medicine

## 2023-03-17 ENCOUNTER — Ambulatory Visit: Payer: Medicare Other | Admitting: Internal Medicine

## 2023-03-17 VITALS — BP 130/80 | HR 68 | Ht 62.0 in | Wt 174.0 lb

## 2023-03-17 DIAGNOSIS — E1059 Type 1 diabetes mellitus with other circulatory complications: Secondary | ICD-10-CM

## 2023-03-17 LAB — POCT GLYCOSYLATED HEMOGLOBIN (HGB A1C): Hemoglobin A1C: 7.8 % — AB (ref 4.0–5.6)

## 2023-03-17 MED ORDER — INSULIN LISPRO PROT & LISPRO (75-25 MIX) 100 UNIT/ML ~~LOC~~ SUSP: SUBCUTANEOUS | 3 refills | Status: AC

## 2023-03-17 MED ORDER — FREESTYLE LIBRE 3 PLUS SENSOR MISC: 1.0000 | 3 refills | Status: AC

## 2023-03-17 MED ORDER — INSULIN SYRINGE 29G X 1" 0.3 ML MISC: 1.0000 | Freq: Two times a day (BID) | 3 refills | Status: AC

## 2023-03-17 NOTE — Progress Notes (Signed)
 Name: [name]  MRN/ DOB: [id number], 1940/06/17   Age/ Sex: 83 y.o., female    PCP: Geofm Glade PARAS, MD   Reason for Endocrinology Evaluation: Type 1 Diabetes Mellitus     Date of Initial Endocrinology Visit: 12/18/2021    PATIENT IDENTIFIER: [name] is a 83 y.o. female with a past medical history of DM, CAD and OSA. The patient presented for initial endocrinology clinic visit on 12/18/2021  for consultative assistance with her diabetes management.    HPI: [name] was    Diagnosed with DM 1986 Prior Medications tried/Intolerance: Metformin - Vomiting  Hemoglobin A1c has ranged from 6.9% in 2009, peaking at 9.5% in 2023.   Used to follow-up with Dr. Tommas   On her initial visit to our clinic her A1c was 6.9%, she was taking Humalog  Mix 3 times daily, this was adjusted to twice daily    SUBJECTIVE:   During the last visit (10/05/2022): A1c 7.3%    Today (03/17/23): [name] is here for a follow up on diabetes management. She checks her blood sugars multiple times daily through the freestyle libre. No hypoglycemia  Patient is under the care of cardiology for evaluation of bradycardia, CAD and CHF presented to the ED 02/16/2023 with low heart rate in the 40s.  Beta-blockers were discontinued She follows with Ortho for carpal tunnel syndrome   Denies nausea or vomiting  Denies constipation or diarrhea   HOME DIABETES REGIMEN: Humalog  mix 6 units With breakfast,  and 6 units with Supper       Freestyle Libre2: unable to download 122-250      DIABETIC COMPLICATIONS: Microvascular complications:   Denies: CKD, retinopathy Last eye exam: Completed 11/2021  Macrovascular complications:  CAD Denies:  PVD, CVA   PAST HISTORY: Past Medical History:  Past Medical History:  Diagnosis Date   Adenomatous colon polyp    Allergy     Anxiety    Arthritis    Asthma    Carotid artery disease (HCC)    carotid US  02/2017: bilat ICA 1-39%    Chronic diastolic CHF    Echo 02/2017: EF 65-70, Gr 2 DD, mild MS (mean 5), PASP 44   COPD    pt is unsure if has been officially diagnosed   Coronary artery disease    CABG '09- cathed 12/09, 9/10, 6/11, 3/14 and 12/13/16- medical Rx // cath 12/2016 - 2/3 grafts patent >> med Rx // Myoview  12/17: low risk   Diabetes mellitus    Dyspnea    with exertion   Gastroesophageal reflux disease    Headache    Hiatal hernia    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   Hyperlipidemia    Hypertension    Myocardial infarction (HCC)    PONV (postoperative nausea and vomiting)    Schatzki's ring    Shoulder injury    resolved after shoulder surgery   Sleep apnea    not on cpap   Past Surgical History:  Past Surgical History:  Procedure Laterality Date   ABDOMINAL HYSTERECTOMY     APPENDECTOMY     came out with Hysterectomy   CARDIAC CATHETERIZATION  07/23/2009   EF 60%   CARDIAC CATHETERIZATION  10/11/2008   CARDIAC CATHETERIZATION  03/01/2007   EF 75-80%   CARDIOVASCULAR STRESS TEST  11/15/2007   EF 60%   COLONOSCOPY     CORONARY ARTERY BYPASS GRAFT     SEVERELY DISEASED SAPHENOUS VEIN  GRAFT TO THE RIGHT CORONARY ARTERY BUT WITH FAIRLY WELL PRESERVED FLOW TO THE DISTAL RIGHT CORONARY ARTERY FROM THE NATIVE CIRCULATION-RESTART  CATH IN JUNE 2000, REVEALS MILD/MODERATE  CAD WITH GOOD FLOW DOWN HER LAD   ESOPHAGOGASTRODUODENOSCOPY     EYE SURGERY     bilateral cataract surgery with lens implant   LEFT HEART CATH AND CORS/GRAFTS ANGIOGRAPHY N/A 12/14/2016   Procedure: LEFT HEART CATH AND CORS/GRAFTS ANGIOGRAPHY;  Surgeon: Dann Candyce RAMAN, MD;  Location: [facility];  Service: Cardiovascular;  Laterality: N/A;   lense removal Left    POLYPECTOMY     REVERSE SHOULDER ARTHROPLASTY Left 06/25/2022   Procedure: REVERSE SHOULDER ARTHROPLASTY;  Surgeon: Kay Kemps, MD;  Location: [facility];  Service: Orthopedics;  Laterality: Left;  general and choice with interscalene block    RIGHT/LEFT HEART CATH AND CORONARY/GRAFT ANGIOGRAPHY N/A 07/28/2021   Procedure: RIGHT/LEFT HEART CATH AND CORONARY/GRAFT ANGIOGRAPHY;  Surgeon: Burnard Debby LABOR, MD;  Location: [facility];  Service: Cardiovascular;  Laterality: N/A;   ROTATOR CUFF REPAIR     right and left   TONSILLECTOMY     age 25   TOTAL KNEE ARTHROPLASTY Right 02/20/2014   Procedure: RIGHT TOTAL KNEE ARTHROPLASTY;  Surgeon: Tanda LABOR Heading, MD;  Location: [facility];  Service: Orthopedics;  Laterality: Right;   tumor removed kidney     UPPER GASTROINTESTINAL ENDOSCOPY     US  ECHOCARDIOGRAPHY  03/08/2008   EF 55-60%    Social History:  reports that she quit smoking about 47 years ago. Her smoking use included cigarettes. She started smoking about 68 years ago. She has a 10.5 pack-year smoking history. She has never used smokeless tobacco. She reports that she does not drink alcohol and does not use drugs. Family History:  Family History  Problem Relation Age of Onset   Heart disease Maternal Grandfather    Heart failure Maternal Grandfather    Diabetes Maternal Grandfather    Heart attack Father    Diabetes Mother    Rheum arthritis Sister    Emphysema Paternal Uncle    Esophageal cancer Brother 32       she said he was born with it   Emphysema Paternal Aunt    Healthy Child    Neuropathy Neg Hx    Multiple sclerosis Neg Hx    Colon cancer Neg Hx    Colon polyps Neg Hx    Rectal cancer Neg Hx    Stomach cancer Neg Hx      HOME MEDICATIONS: Allergies as of 03/17/2023       Reactions   Amlodipine Other (See Comments)   hallucinations    Banana Nausea And Vomiting   Stomach pumped   Co Q10 [coenzyme Q10] Other (See Comments)   Body cramps   Codeine Other (See Comments)   Hallucinate, loose identity and don't know who I am   Insulin  Aspart (human Analog) Other (See Comments)   Unknown   Lisinopril  Other (See Comments)   nose bleed   Metformin  And Related Nausea And Vomiting   Morphine Other (See  Comments)   Can not function, it immobilizes me    Pentazocine Nausea And Vomiting   Pravastatin  Other (See Comments)   Hands locked up   Repatha [evolocumab] Other (See Comments)   myalgias   Statins Other (See Comments)   Muscle cramps   Sulfa Antibiotics Swelling   Sulfonamide Derivatives Swelling   Tramadol  Hcl Other (See Comments)   Dizziness,  cant function         Medication List        Accurate as of March 17, 2023  9:28 AM. If you have any questions, ask your nurse or doctor.          acetaminophen  325 MG tablet Commonly known as: TYLENOL  Take 650 mg by mouth every 6 (six) hours as needed for moderate pain or headache.   albuterol  108 (90 Base) MCG/ACT inhaler Commonly known as: VENTOLIN  HFA Inhale 2 puffs into the lungs every 6 (six) hours as needed. For shortness of breath.   albuterol  (2.5 MG/3ML) 0.083% nebulizer solution Commonly known as: PROVENTIL  Take 3 mLs (2.5 mg total) by nebulization every 6 (six) hours as needed for wheezing or shortness of breath.   aspirin  EC 81 MG tablet Take 1 tablet (81 mg total) by mouth daily.   benzonatate  200 MG capsule Commonly known as: TESSALON  Take 1 capsule (200 mg total) by mouth 3 (three) times daily as needed for cough.   calcium  carbonate 500 MG chewable tablet Commonly known as: TUMS - dosed in mg elemental calcium  Chew 1 tablet by mouth daily as needed for indigestion or heartburn.   diltiazem  240 MG 24 hr capsule Commonly known as: CARDIZEM  CD Take 1 capsule (240 mg total) by mouth daily.   divalproex  250 MG 24 hr tablet Commonly known as: DEPAKOTE  ER TAKE 1 TABLET (250 MG TOTAL) BY MOUTH AT BEDTIME.   fluticasone  50 MCG/ACT nasal spray Commonly known as: FLONASE  SPRAY 2 SPRAYS INTO EACH NOSTRIL EVERY DAY What changed: See the new instructions.   FreeStyle Libre 2 Reader Devi 1 Device by Does not apply route every 14 (fourteen) days.   FreeStyle Libre 2 Sensor Misc 1 Device by Does not  apply route every 14 (fourteen) days.   furosemide  20 MG tablet Commonly known as: LASIX  Take 1 tablet (20 mg total) by mouth daily. Take 40 mg for the next 3 days then take 20 mg once a day.   glucose blood test strip Use as instructed to test blood sugars up to 4 times daily   insulin  lispro protamine-lispro (75-25) 100 UNIT/ML Susp injection Commonly known as: HUMALOG  75/25 MIX Inject 18 Units into the skin 2 (two) times daily with a meal. 10 units at lunch depending on bs reading What changed:  how much to take when to take this additional instructions   INSULIN  SYRINGE .3CC/29GX1 29G X 1 0.3 ML Misc 1 Device by Does not apply route in the morning and at bedtime.   isosorbide  mononitrate 60 MG 24 hr tablet Commonly known as: IMDUR  Take one tablet ( 60 mg ) twice daily 8 hours apart.   meclizine  25 MG tablet Commonly known as: ANTIVERT  Take 25 mg by mouth 2 (two) times daily as needed for dizziness.   meloxicam 15 MG tablet Commonly known as: MOBIC Take 15 mg by mouth daily.   multivitamin with minerals Tabs tablet Take 1 tablet by mouth daily.   neomycin-polymyxin b -dexamethasone  3.5-10000-0.1 Susp Commonly known as: MAXITROL SMARTSIG:In Eye(s)   nitroGLYCERIN  0.4 MG SL tablet Commonly known as: NITROSTAT  Place 1 tablet (0.4 mg total) under the tongue every 5 (five) minutes as needed for chest pain.   ondansetron  4 MG tablet Commonly known as: ZOFRAN  TAKE 1 TABLET BY MOUTH EVERY 8 HOURS AS NEEDED FOR NAUSEA AND VOMITING   pantoprazole  40 MG tablet Commonly known as: PROTONIX  Take 1 tablet (40 mg total) by mouth daily.  sertraline  25 MG tablet Commonly known as: ZOLOFT  Take 1 tablet (25 mg total) by mouth daily. After 2 weeks increase to 50 mg daily   spironolactone  25 MG tablet Commonly known as: ALDACTONE  Take 1 tablet (25 mg total) by mouth daily.   temazepam  30 MG capsule Commonly known as: RESTORIL  Take 1 capsule (30 mg total) by mouth  daily. What changed: when to take this   Trelegy Ellipta  200-62.5-25 MCG/ACT Aepb Generic drug: Fluticasone -Umeclidin-Vilant Inhale 1 puff into the lungs daily at 6 (six) AM.   VITAMIN B-12 IJ Inject 1,000 mcg as directed every 30 (thirty) days.   Vitamin D  50 MCG (2000 UT) tablet Take 4,000 Units by mouth daily.         ALLERGIES: Allergies  Allergen Reactions   Amlodipine Other (See Comments)    hallucinations    Banana Nausea And Vomiting    Stomach pumped   Co Q10 [Coenzyme Q10] Other (See Comments)    Body cramps   Codeine Other (See Comments)    Hallucinate, loose identity and don't know who I am   Insulin  Aspart (Human Analog) Other (See Comments)    Unknown   Lisinopril  Other (See Comments)    nose bleed   Metformin  And Related Nausea And Vomiting   Morphine Other (See Comments)    Can not function, it immobilizes me    Pentazocine Nausea And Vomiting   Pravastatin  Other (See Comments)    Hands locked up   Repatha [Evolocumab] Other (See Comments)    myalgias   Statins Other (See Comments)    Muscle cramps   Sulfa Antibiotics Swelling   Sulfonamide Derivatives Swelling   Tramadol  Hcl Other (See Comments)    Dizziness, cant function      REVIEW OF SYSTEMS: A comprehensive ROS was conducted with the patient and is negative except as per HPI     OBJECTIVE:   VITAL SIGNS: BP 130/80 (BP Location: Left Arm, Patient Position: Sitting, Cuff Size: Normal)   Pulse 68   Ht 5' 2 (1.575 m)   Wt 174 lb (78.9 kg)   LMP  (LMP Unknown)   BMI 31.83 kg/m    PHYSICAL EXAM:  General: Pt appears well and is in NAD  Neck: General: Supple without adenopathy or carotid bruits. Thyroid : Thyroid  size normal.  No goiter or nodules appreciated.   Lungs: Clear with good BS bilat   Heart: RRR   Extremities:  Lower extremities - No pretibial edema.  Pt with tenderness at the right 2/3 MTH   Neuro: MS is good with appropriate affect, pt is alert and Ox3    Dm Foot  Exam  10/05/2022   The skin of the feet is intact without sores or ulcerations. The pedal pulses are 2+ on right and 2+ on left. The sensation is intact to a screening 5.07, 10 gram monofilament bilaterally   DATA REVIEWED:  Lab Results  Component Value Date   HGBA1C 7.3 (A) 10/05/2022   HGBA1C 6.6 (H) 06/09/2022   HGBA1C 6.5 (H) 03/31/2022     Latest Reference Range & Units 02/16/23 12:02  Sodium 135 - 145 mmol/L 132 (L)  Potassium 3.5 - 5.1 mmol/L 4.1  Chloride 98 - 111 mmol/L 97 (L)  CO2 22 - 32 mmol/L 30  Glucose 70 - 99 mg/dL 801 (H)  BUN 8 - 23 mg/dL 24 (H)  Creatinine 9.55 - 1.00 mg/dL 9.15  Calcium  8.9 - 10.3 mg/dL 9.0  Anion gap 5 - 15  5  GFR, Estimated >60 mL/min >60    Old records , labs and images have been reviewed.   ASSESSMENT / PLAN / RECOMMENDATIONS:   1) Type 1 Diabetes Mellitus, Sub-Optimaly controlled, With macrovascular complications - Most recent A1c of 7.8 %. Goal A1c < 7.5 %.     -Patient with history of recurrent severe hypoglycemic episodes, and manually reviewing freestyle libre 2 that has been no hypoglycemic episodes. -The patient endorses hypoglycemia overnight, while reviewing her CGM, the patient had a BG reading of 161 Mg/DL at bedtime, but by this morning her BG was over 200, I will increase her suppertime of insulin  as below -Patient uses vials, does not wish to change to pens due to cost issues -I will upgrade her to freestyle libre 3 plus as she has been having issues with freestyle libre 2 -Emphasized the importance of taking insulin  before the meal rather than after the meal -She was also advised that she may reduce her insulin  dose by 50% if her appetite is low and she is not going to eat her typical size meal  MEDICATIONS: Change Humalog  mix 6 units before breakfast and 8 units before supper  EDUCATION / INSTRUCTIONS: BG monitoring instructions: Patient is instructed to check her blood sugars 3 times a day, before each  meal. Call [facility] if: BG persistently < 70  I reviewed the Rule of 15 for the treatment of hypoglycemia in detail with the patient. Literature supplied.   2) Diabetic complications:  Eye: Does not have known diabetic retinopathy.  Neuro/ Feet: Does not have known diabetic peripheral neuropathy. Renal: Patient does not have known baseline CKD. She is not on an ACEI/ARB at present.    Follow-up in 4 months    Signed electronically by: Stefano Redgie Butts, MD  [facility]  [facility] [address] [place], [place] [address] Phone: [phone] FAX: [phone]   CC: Geofm Glade PARAS, MD [address] [place] [address] Phone: [phone]  Fax: [phone]    Return to Endocrinology clinic as below: Future Appointments  Date Time Provider Department Center  03/17/2023  9:30 AM Mavis Fichera, Donell Redgie, MD [facility] None  03/29/2023 11:00 AM Geofm Glade PARAS, MD [facility] None  03/30/2023  1:00 PM [facility] 2 [facility]  05/03/2023  1:20 PM Nahser, Aleene PARAS, MD [facility]  05/23/2023  2:10 PM Skeet Juliene SAUNDERS, DO [facility] None  06/22/2023 11:00 AM [facility] None

## 2023-03-17 NOTE — Patient Instructions (Signed)
 Change Humalog  Mix 6 units With Breakfast and 8 units with Supper      HOW TO TREAT LOW BLOOD SUGARS (Blood sugar LESS THAN 70 MG/DL) Please follow the RULE OF 15 for the treatment of hypoglycemia treatment (when your (blood sugars are less than 70 mg/dL)   STEP 1: Take 15 grams of carbohydrates when your blood sugar is low, which includes:  3-4 GLUCOSE TABS  OR 3-4 OZ OF JUICE OR REGULAR SODA OR ONE TUBE OF GLUCOSE GEL    STEP 2: RECHECK blood sugar in 15 MINUTES STEP 3: If your blood sugar is still low at the 15 minute recheck --> then, go back to STEP 1 and treat AGAIN with another 15 grams of carbohydrates.   HOW TO TREAT LOW BLOOD SUGARS (Blood sugar LESS THAN 70 MG/DL) Please follow the RULE OF 15 for the treatment of hypoglycemia treatment (when your (blood sugars are less than 70 mg/dL)   STEP 1: Take 15 grams of carbohydrates when your blood sugar is low, which includes:  3-4 GLUCOSE TABS  OR 3-4 OZ OF JUICE OR REGULAR SODA OR ONE TUBE OF GLUCOSE GEL    STEP 2: RECHECK blood sugar in 15 MINUTES STEP 3: If your blood sugar is still low at the 15 minute recheck --> then, go back to STEP 1 and treat AGAIN with another 15 grams of carbohydrates.

## 2023-03-22 ENCOUNTER — Telehealth: Payer: Self-pay

## 2023-03-22 NOTE — Telephone Encounter (Signed)
Left message to call back

## 2023-03-22 NOTE — Telephone Encounter (Signed)
-----   Message from Rip Harbour sent at 03/21/2023 11:07 PM EST ----- Please let [name] know that her cardiac monitor showed that her predominant rhythm was normal sinus rhythm.  She had rare, short episodes of fast heart beats that originated from the top chambers of the heart.  She had rare episodes of single early beats from the top and bottom chambers of the heart.  Overall good results.  Follow-up with Dr. Elease Hashimoto on 3/25 as planned.

## 2023-03-22 NOTE — Telephone Encounter (Signed)
Called patient advised of below they verbalized understanding.

## 2023-03-28 ENCOUNTER — Encounter: Payer: Self-pay | Admitting: Internal Medicine

## 2023-03-28 NOTE — Progress Notes (Unsigned)
Subjective:    Patient ID: [name], female    DOB: [birth year], 83 y.o.   MRN: [id number]     HPI [name] is here for follow up of her chronic medical problems.  Saw ortho - CTS left wrist  Medications and allergies reviewed with patient and updated if appropriate.  Current Outpatient Medications on File Prior to Visit  Medication Sig Dispense Refill   acetaminophen (TYLENOL) 325 MG tablet Take 650 mg by mouth every 6 (six) hours as needed for moderate pain or headache. (Patient not taking: Reported on 03/17/2023)     albuterol (PROVENTIL HFA;VENTOLIN HFA) 108 (90 BASE) MCG/ACT inhaler Inhale 2 puffs into the lungs every 6 (six) hours as needed. For shortness of breath. 1 Inhaler 0   albuterol (PROVENTIL) (2.5 MG/3ML) 0.083% nebulizer solution Take 3 mLs (2.5 mg total) by nebulization every 6 (six) hours as needed for wheezing or shortness of breath. 360 mL 5   aspirin EC 81 MG tablet Take 1 tablet (81 mg total) by mouth daily.     benzonatate (TESSALON) 200 MG capsule Take 1 capsule (200 mg total) by mouth 3 (three) times daily as needed for cough. (Patient not taking: Reported on 03/17/2023) 90 capsule 0   calcium carbonate (TUMS - DOSED IN MG ELEMENTAL CALCIUM) 500 MG chewable tablet Chew 1 tablet by mouth daily as needed for indigestion or heartburn.     Cholecalciferol (VITAMIN D) 50 MCG (2000 UT) tablet Take 4,000 Units by mouth daily.     Continuous Glucose Sensor (FREESTYLE LIBRE 3 PLUS SENSOR) MISC 1 Device by Other route every 14 (fourteen) days. Change sensor every 15 days. 6 each 3   Cyanocobalamin (VITAMIN B-12 IJ) Inject 1,000 mcg as directed every 30 (thirty) days.     diltiazem (CARDIZEM CD) 240 MG 24 hr capsule Take 1 capsule (240 mg total) by mouth daily. 90 capsule 2   divalproex (DEPAKOTE ER) 250 MG 24 hr tablet TAKE 1 TABLET (250 MG TOTAL) BY MOUTH AT BEDTIME. 90 tablet 1   fluticasone (FLONASE) 50 MCG/ACT nasal spray SPRAY 2 SPRAYS INTO EACH NOSTRIL EVERY DAY  (Patient taking differently: Place 2 sprays into both nostrils daily as needed for allergies.) 48 mL 3   Fluticasone-Umeclidin-Vilant (TRELEGY ELLIPTA) 200-62.5-25 MCG/ACT AEPB Inhale 1 puff into the lungs daily at 6 (six) AM. 60 each 5   furosemide (LASIX) 20 MG tablet Take 1 tablet (20 mg total) by mouth daily. Take 40 mg for the next 3 days then take 20 mg once a day. 93 tablet 3   glucose blood test strip Use as instructed to test blood sugars up to 4 times daily 200 each 12   insulin lispro protamine-lispro (HUMALOG 75/25 MIX) (75-25) 100 UNIT/ML SUSP injection Inject 0.06 mLs (6 Units total) into the skin daily with breakfast AND 0.08 mLs (8 Units total) daily with supper. 10 units at lunch depending on bs reading. 15 mL 3   Insulin Syringe-Needle U-100 (INSULIN SYRINGE .3CC/29GX1") 29G X 1" 0.3 ML MISC 1 Device by Does not apply route in the morning and at bedtime. 200 each 3   isosorbide mononitrate (IMDUR) 60 MG 24 hr tablet Take one tablet ( 60 mg ) twice daily 8 hours apart. 180 tablet 3   meclizine (ANTIVERT) 25 MG tablet Take 25 mg by mouth 2 (two) times daily as needed for dizziness.     meloxicam (MOBIC) 15 MG tablet Take 15 mg by mouth daily.  Multiple Vitamin (MULTIVITAMIN WITH MINERALS) TABS Take 1 tablet by mouth daily.     neomycin-polymyxin b-dexamethasone (MAXITROL) 3.5-10000-0.1 SUSP SMARTSIG:In Eye(s)     nitroGLYCERIN (NITROSTAT) 0.4 MG SL tablet Place 1 tablet (0.4 mg total) under the tongue every 5 (five) minutes as needed for chest pain. 25 tablet 3   ondansetron (ZOFRAN) 4 MG tablet TAKE 1 TABLET BY MOUTH EVERY 8 HOURS AS NEEDED FOR NAUSEA AND VOMITING 20 tablet 1   pantoprazole (PROTONIX) 40 MG tablet Take 1 tablet (40 mg total) by mouth daily. 90 tablet 2   sertraline (ZOLOFT) 25 MG tablet Take 1 tablet (25 mg total) by mouth daily. After 2 weeks increase to 50 mg daily 60 tablet 5   spironolactone (ALDACTONE) 25 MG tablet Take 1 tablet (25 mg total) by mouth daily.  90 tablet 1   temazepam (RESTORIL) 30 MG capsule Take 1 capsule (30 mg total) by mouth daily. (Patient taking differently: Take 30 mg by mouth at bedtime.) 30 capsule 0   Current Facility-Administered Medications on File Prior to Visit  Medication Dose Route Frequency Provider Last Rate Last Admin   Study - ORION 4 - inclisiran 300 mg/1.31mL or placebo SQ injection (PI-Stuckey)  300 mg Subcutaneous Q6 months    300 mg at 12/24/22 1150     Review of Systems     Objective:  There were no vitals filed for this visit. BP Readings from Last 3 Encounters:  03/17/23 130/80  02/21/23 132/70  02/16/23 (!) 143/71   Wt Readings from Last 3 Encounters:  03/17/23 174 lb (78.9 kg)  02/21/23 174 lb 3.2 oz (79 kg)  02/16/23 160 lb (72.6 kg)   There is no height or weight on file to calculate BMI.    Physical Exam     Lab Results  Component Value Date   WBC 6.1 02/16/2023   HGB 11.7 (L) 02/16/2023   HCT 35.9 (L) 02/16/2023   PLT 256 02/16/2023   GLUCOSE 153 (H) 02/28/2023   CHOL 108 10/21/2020   TRIG 125 10/21/2020   HDL 27 (L) 10/21/2020   LDLCALC 58 10/21/2020   ALT 11 09/10/2022   AST 15 09/10/2022   NA 140 02/28/2023   K 5.1 02/28/2023   CL 101 02/28/2023   CREATININE 1.01 (H) 02/28/2023   BUN 30 (H) 02/28/2023   CO2 26 02/28/2023   TSH 0.718 09/09/2022   INR 1.1 07/28/2021   HGBA1C 7.8 (A) 03/17/2023   MICROALBUR 1.6 09/23/2022     Assessment & Plan:    See Problem List for Assessment and Plan of chronic medical problems.

## 2023-03-29 ENCOUNTER — Ambulatory Visit (INDEPENDENT_AMBULATORY_CARE_PROVIDER_SITE_OTHER): Payer: Medicare Other | Admitting: Internal Medicine

## 2023-03-29 VITALS — BP 134/76 | HR 68 | Temp 98.3°F | Resp 18 | Ht 62.0 in

## 2023-03-29 DIAGNOSIS — R252 Cramp and spasm: Secondary | ICD-10-CM

## 2023-03-29 DIAGNOSIS — I10 Essential (primary) hypertension: Secondary | ICD-10-CM

## 2023-03-29 DIAGNOSIS — R42 Dizziness and giddiness: Secondary | ICD-10-CM | POA: Diagnosis not present

## 2023-03-29 DIAGNOSIS — K219 Gastro-esophageal reflux disease without esophagitis: Secondary | ICD-10-CM

## 2023-03-29 DIAGNOSIS — F419 Anxiety disorder, unspecified: Secondary | ICD-10-CM | POA: Diagnosis not present

## 2023-03-29 MED ORDER — SERTRALINE HCL 100 MG PO TABS: 100.0000 mg | ORAL_TABLET | Freq: Every day | ORAL | 5 refills | Status: DC

## 2023-03-29 MED ORDER — TIZANIDINE HCL 2 MG PO TABS
2.0000 mg | ORAL_TABLET | Freq: Every evening | ORAL | 5 refills | Status: DC | PRN
Start: 2023-03-29 — End: 2023-08-24

## 2023-03-29 NOTE — Assessment & Plan Note (Signed)
Chronic Both legs at night Denies any back issues, but could still be back related She is drinking plenty of water Trial of tizanidine 2-4 mg at night-discussed potential side effects and advised her to start with 2 mg at night and try 4 mg if needed, but to be cautious because of drowsiness

## 2023-03-29 NOTE — Patient Instructions (Addendum)
       Medications changes include :   tizanidine 2-4 mg at night for muscle cramping --- may cause drowsiness.  Increase   Try Benefiber 1-2 tsp 1-2 times a day    A referral was ordered to ENT below and someone will call you to schedule an appointment.    ENT/Head and Neck Surgery - [facility] [address], [place] [address] [phone]   Return in about 6 months (around 09/26/2023) for Physical Exam.

## 2023-03-29 NOTE — Assessment & Plan Note (Signed)
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 PPPD,?  Related to nerve damage from diabetes

## 2023-03-29 NOTE — Assessment & Plan Note (Addendum)
Chronic Not ideally controlled Increase sertraline to 100 mg daily Anxiety may improve when she has better sugar control since that is a big source of her anxiety.

## 2023-03-30 ENCOUNTER — Ambulatory Visit (HOSPITAL_COMMUNITY)
Admission: RE | Admit: 2023-03-30 | Payer: Medicare Other | Source: Ambulatory Visit | Attending: Cardiology | Admitting: Cardiology

## 2023-04-04 ENCOUNTER — Other Ambulatory Visit: Payer: Self-pay | Admitting: Internal Medicine

## 2023-04-04 DIAGNOSIS — E1059 Type 1 diabetes mellitus with other circulatory complications: Secondary | ICD-10-CM

## 2023-04-07 ENCOUNTER — Telehealth: Payer: Self-pay | Admitting: Internal Medicine

## 2023-04-07 NOTE — Telephone Encounter (Signed)
 Copied from CRM [id number]. Topic: Referral - Status >> Apr 07, 2023  2:50 PM Chantha C wrote: Reason for CRM: Patient states [facility] E[facility] Surgery does not have her referral, she cannot make an appointment. Please advise and call back [phone].

## 2023-04-14 LAB — HM MAMMOGRAPHY

## 2023-04-18 NOTE — Telephone Encounter (Signed)
 Called and spoke with [facility] and was informed that they no longer take faxed referrals. I was able to give a verbal referral and scheduled the patient for 06/07/23 at 1:00.   Spoke with patient and provided them the number to the scheduling team incase this time/date did not work for them.  Number to referrals team [phone]

## 2023-04-20 ENCOUNTER — Other Ambulatory Visit: Payer: Self-pay | Admitting: Internal Medicine

## 2023-04-26 ENCOUNTER — Ambulatory Visit: Payer: Medicare Other | Admitting: Internal Medicine

## 2023-04-28 ENCOUNTER — Other Ambulatory Visit: Payer: Self-pay | Admitting: Internal Medicine

## 2023-04-28 ENCOUNTER — Other Ambulatory Visit: Payer: Self-pay | Admitting: Pulmonary Disease

## 2023-05-02 ENCOUNTER — Encounter: Payer: Self-pay | Admitting: Cardiovascular Disease

## 2023-05-02 NOTE — Progress Notes (Unsigned)
 Cardiology Office Note   Date:  05/03/2023   ID:  [name], DOB October 25, 1940, MRN [id number]  PCP:  Pincus Sanes, MD  Cardiologist:   Kristeen Miss, MD   Chief Complaint  Patient presents with   Coronary Artery Disease        Hypertension        1. Coronary artery disease-status post CABG, the saphenous vein graft to the right coronary artery is severely diseased but she has good flow to the distal right coronary artery from the native circulation. 2. Diabetes mellitus 3. Hypertension 4. Hyperlipidemia 5. COPD: June, 2013 - FEV1 equals 0.89 L which is 48% of predicted value. Her DLCO is also markedly depressed. 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   [name] is a 83 y.o. female with the above noted hx.  She continues to have episodes of sharp pain ( few seconds)  followed by a burning pain ( last 6-7 minutes).  She usually does not take NTG because it causes a headache.  She was started on Lasix earlier this year. She seems to be doing much better on Lasix. She's had only function test which revealed severe obstructive lung disease with a good initial response to bronchodilators.  She is scheduled to have surgery all right knee. She's here today for preoperative evaluation prior to having the surgery.  September 04, 2012:  [name] is seen today for a follow up visit.  She saw Tereso Newcomer in April and was started on bystolic.  She has tolerated that fairly well.    She has some episodes of CP that are constant.    She is very stable.   03/01/2013:  She continues to have significant CP.  Still short of breath.  She has no energy.  Just does not feel well in general.     She does not get much sleep - perhaps 3 hours a night.   Jan. 8, 2016:  [name] is a 83 yo who we follow for CAD, DM, HTN, hyperlipidemia, .  She has COPD:  FEV1 equals 0.89 L which is 48% of predicted value. Her DLCO is also markedly depressed. She has severe  obstructive lung disease with a beneficial response to bronchodilators.  Breathing is the same.  Has some upper respiratory issues.   Still has some tightness.    Jun 17, 2014:    [name] is a 83 y.o. female who presents for episodes of CP    Started 1 1/2 months ago.  Has tried mylanta - thought it was gas.  Did not get any better Occurs wjith exertion or at rest. , left sided chest pain   No radiation Gets lightheaded. Has tried NTG which seems to help.    Nov. 15 2016:  Has not been feeling well for the past couple of months Has a chest pressure when she gets out of bed.  Occurs twice a day - in the AM when she gets up  And then again at 3:30 in the afternoon.   Is active between those times , makes home visit.   Does water aerobics without any CP / pressure.   Wears home O2 at night.     Jun 23, 2015: Doing the same.  Still has the same chest pain  Last cath was in 2014 - showed patent LIMA to LAD, SVG to D1, the SVG to RCA was occluded   Native RCA has a 40-50%  prox stenosis   The pains occur typically twice a day - Upon awakening and around 7 PM.   Nov. 20, 2017:  Has had lots of fatigue / pressure in her chest . Increased shortness of breath walking a short distance  Has COPD - had issues with bronchitis ,  Was on prednisone  Pain was relieved with NTG and also by taking additional aspirin   April 08, 2016:  She has had some GI bleeding from diverticulitis  Was in the hospital for 4 days  Has been seeing Dr. Russella Dar.   Aug. 14, 2018: [name] is seen today for follow up    GI bleeding has resolved  Breathing is still an issue.  Feet are swollen by the end of the day  ( no swelling this am )   Jun 24, 2017:  Her BP has been variable  Her dyspnea has been worse recently . Having trouble sleeping at night.   Has been given home O2 .   Never arrived   Feb. 9, 2021  Was sick for 2 weeks .  She tested negative for covid 5 days ago .     She is getting  better. Son was recently diagnosed with COVID.   She is concerned that she may have it despite her negative test 5 days ago    She did not lose her taste or smell  Jan. 3, 2022:  [name] is seen today for follow up of her CAD She has severe COPD and severely reduced diffusion capacity . She lost her son to COVID last march. Encouraged her to get some grief counceling   Still has some chest pain - seems to be related to the stress of the death of her son . The pains last 10-15 minutes  She is on Imdur.   Takes NTG on occasion.  Also has some sternal wire pain / positional pain   Sept. 22, 2023 [name] is seen for follow up of her COPD, Severely reduced diffusion capacity Chronic chest pain   Recent cath  07/28/21 LM - normal LAD - prox 60%, D1 - 100 Lcx - normal RCA 25% prox  LIMA to LAD - patent SVG to D1 patent  SVG to PDA - occluded   LV - 60%,   LVEDP is 16 mmHg  Has continue to have CP and dyspnea .  Some relief with lidocaine patches   Past several days has had nausea, diarrhea No COVID symptoms Has a cough but not congested.   She has known severe reduction in her DLCO  Will update echo given her symptoms of CP, dyspnea.   She needs to have shoulder replacement . She is at low risk for her upcoming shoulder replacement surgery .  Ok for her to hold her ASA for 5-7 days prior to surgery.   May 03, 2023 [name] is seen for follow up of her diastolic CHF,  Severe reduction of her diffusion capacity ( DLCO) She has seen pulmonary in the past She is having more dyspnea recently   Has chronic chest pain - which   Her echo is scheduled for this week    Past Medical History:  Diagnosis Date   Adenomatous colon polyp    Allergy    Anxiety    Arthritis    Asthma    Carotid artery disease (HCC)    carotid US 02/2017: bilat ICA 1-39%   Chronic diastolic CHF    Echo 02/2017: EF 65-70, Gr 2 DD,  mild MS (mean 5), PASP 44   COPD    pt is unsure if has been  officially diagnosed   Coronary artery disease    CABG '09- cathed 12/09, 9/10, 6/11, 3/14 and 12/13/16- medical Rx // cath 12/2016 - 2/3 grafts patent >> med Rx // Myoview 12/17: low risk   Diabetes mellitus    Dyspnea    with exertion   Gastroesophageal reflux disease    Headache    Hiatal hernia    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   Hyperlipidemia    Hypertension    Myocardial infarction (HCC)    PONV (postoperative nausea and vomiting)    Schatzki's ring    Shoulder injury    resolved after shoulder surgery   Sleep apnea    not on cpap    Past Surgical History:  Procedure Laterality Date   ABDOMINAL HYSTERECTOMY     APPENDECTOMY     came out with Hysterectomy   CARDIAC CATHETERIZATION  07/23/2009   EF 60%   CARDIAC CATHETERIZATION  10/11/2008   CARDIAC CATHETERIZATION  03/01/2007   EF 75-80%   CARDIOVASCULAR STRESS TEST  11/15/2007   EF 60%   COLONOSCOPY     CORONARY ARTERY BYPASS GRAFT     SEVERELY DISEASED SAPHENOUS VEIN GRAFT TO THE RIGHT CORONARY ARTERY BUT WITH FAIRLY WELL PRESERVED FLOW TO THE DISTAL RIGHT CORONARY ARTERY FROM THE NATIVE CIRCULATION-RESTART  CATH IN JUNE 2000, REVEALS MILD/MODERATE  CAD WITH GOOD FLOW DOWN HER LAD   ESOPHAGOGASTRODUODENOSCOPY     EYE SURGERY     bilateral cataract surgery with lens implant   LEFT HEART CATH AND CORS/GRAFTS ANGIOGRAPHY N/A 12/14/2016   Procedure: LEFT HEART CATH AND CORS/GRAFTS ANGIOGRAPHY;  Surgeon: Corky Crafts, MD;  Location: [facility];  Service: Cardiovascular;  Laterality: N/A;   lense removal Left    POLYPECTOMY     REVERSE SHOULDER ARTHROPLASTY Left 06/25/2022   Procedure: REVERSE SHOULDER ARTHROPLASTY;  Surgeon: Beverely Low, MD;  Location: [facility];  Service: Orthopedics;  Laterality: Left;  general and choice with interscalene block   RIGHT/LEFT HEART CATH AND CORONARY/GRAFT ANGIOGRAPHY N/A 07/28/2021   Procedure: RIGHT/LEFT HEART CATH AND CORONARY/GRAFT ANGIOGRAPHY;  Surgeon: Lennette Bihari, MD;  Location: [facility];  Service: Cardiovascular;  Laterality: N/A;   ROTATOR CUFF REPAIR     right and left   TONSILLECTOMY     age 40   TOTAL KNEE ARTHROPLASTY Right 02/20/2014   Procedure: RIGHT TOTAL KNEE ARTHROPLASTY;  Surgeon: Jacki Cones, MD;  Location: [facility];  Service: Orthopedics;  Laterality: Right;   tumor removed kidney     UPPER GASTROINTESTINAL ENDOSCOPY     US ECHOCARDIOGRAPHY  03/08/2008   EF 55-60%     Current Outpatient Medications  Medication Sig Dispense Refill   acetaminophen (TYLENOL) 325 MG tablet Take 650 mg by mouth every 6 (six) hours as needed for moderate pain (pain score 4-6) or headache.     albuterol (PROVENTIL HFA;VENTOLIN HFA) 108 (90 BASE) MCG/ACT inhaler Inhale 2 puffs into the lungs every 6 (six) hours as needed. For shortness of breath. 1 Inhaler 0   albuterol (PROVENTIL) (2.5 MG/3ML) 0.083% nebulizer solution Take 3 mLs (2.5 mg total) by nebulization every 6 (six) hours as needed for wheezing or shortness of breath. 360 mL 5   aspirin EC 81 MG tablet Take 1 tablet (81 mg total) by mouth daily.  calcium carbonate (TUMS - DOSED IN MG ELEMENTAL CALCIUM) 500 MG chewable tablet Chew 1 tablet by mouth daily as needed for indigestion or heartburn.     Cholecalciferol (VITAMIN D) 50 MCG (2000 UT) tablet Take 4,000 Units by mouth daily.     Continuous Glucose Sensor (FREESTYLE LIBRE 3 PLUS SENSOR) MISC 1 Device by Other route every 14 (fourteen) days. Change sensor every 15 days. 6 each 3   Cyanocobalamin (VITAMIN B-12 IJ) Inject 1,000 mcg as directed every 30 (thirty) days.     diltiazem (CARDIZEM CD) 240 MG 24 hr capsule Take 1 capsule (240 mg total) by mouth daily. 90 capsule 2   divalproex (DEPAKOTE ER) 250 MG 24 hr tablet TAKE 1 TABLET (250 MG TOTAL) BY MOUTH AT BEDTIME. 90 tablet 1   fluticasone (FLONASE) 50 MCG/ACT nasal spray SPRAY 2 SPRAYS INTO EACH NOSTRIL EVERY DAY (Patient taking differently: Place 2 sprays into both  nostrils daily as needed for allergies.) 48 mL 3   Fluticasone-Umeclidin-Vilant (TRELEGY ELLIPTA) 200-62.5-25 MCG/ACT AEPB Inhale 1 puff into the lungs daily at 6 (six) AM. 60 each 5   furosemide (LASIX) 20 MG tablet Take 1 tablet (20 mg total) by mouth daily. Take 40 mg for the next 3 days then take 20 mg once a day. 93 tablet 3   Glucagon (BAQSIMI ONE PACK) 3 MG/DOSE POWD Administer 1 spray into affected nostril(s) as needed for hypoglycemic emergencies.     insulin lispro protamine-lispro (HUMALOG 75/25 MIX) (75-25) 100 UNIT/ML SUSP injection Inject 0.06 mLs (6 Units total) into the skin daily with breakfast AND 0.08 mLs (8 Units total) daily with supper. 10 units at lunch depending on bs reading. 15 mL 3   Insulin Syringe-Needle U-100 (INSULIN SYRINGE .3CC/29GX1") 29G X 1" 0.3 ML MISC 1 Device by Does not apply route in the morning and at bedtime. 200 each 3   isosorbide mononitrate (IMDUR) 60 MG 24 hr tablet Take one tablet ( 60 mg ) twice daily 8 hours apart. 180 tablet 3   meclizine (ANTIVERT) 25 MG tablet Take 25 mg by mouth 2 (two) times daily as needed for dizziness.     meloxicam (MOBIC) 15 MG tablet Take 15 mg by mouth daily.     metoprolol succinate (TOPROL-XL) 100 MG 24 hr tablet Take 100 mg by mouth daily.     Multiple Vitamin (MULTIVITAMIN WITH MINERALS) TABS Take 1 tablet by mouth daily.     neomycin-polymyxin b-dexamethasone (MAXITROL) 3.5-10000-0.1 SUSP SMARTSIG:In Eye(s)     nitroGLYCERIN (NITROSTAT) 0.4 MG SL tablet Place 1 tablet (0.4 mg total) under the tongue every 5 (five) minutes as needed for chest pain. 25 tablet 3   ondansetron (ZOFRAN) 4 MG tablet TAKE 1 TABLET BY MOUTH EVERY 8 HOURS AS NEEDED FOR NAUSEA AND VOMITING 20 tablet 1   ONETOUCH VERIO test strip USE AS INSTRUCTED TO TEST BLOOD SUGARS UP TO 4 TIMES DAILY 200 strip 12   pantoprazole (PROTONIX) 40 MG tablet Take 1 tablet (40 mg total) by mouth daily. 90 tablet 2   sertraline (ZOLOFT) 100 MG tablet TAKE 1 TABLET  BY MOUTH EVERY DAY 90 tablet 2   spironolactone (ALDACTONE) 25 MG tablet Take 1 tablet (25 mg total) by mouth daily. 90 tablet 1   temazepam (RESTORIL) 30 MG capsule Take 1 tablet by mouth daily.     tiZANidine (ZANAFLEX) 2 MG tablet Take 1-2 tablets (2-4 mg total) by mouth at bedtime as needed for muscle spasms. 60 tablet 5  Current Facility-Administered Medications  Medication Dose Route Frequency Provider Last Rate Last Admin   Study - ORION 4 - inclisiran 300 mg/1.57mL or placebo SQ injection (PI-Stuckey)  300 mg Subcutaneous Q6 months    300 mg at 12/24/22 1150    Allergies:   Amlodipine, Banana, Co q10 [coenzyme q10], Codeine, Insulin aspart (human analog), Lisinopril, Metformin and related, Morphine, Pentazocine, Pravastatin, Repatha [evolocumab], Statins, Sulfa antibiotics, Sulfonamide derivatives, and Tramadol hcl    Social History:  The patient  reports that she quit smoking about 47 years ago. Her smoking use included cigarettes. She started smoking about 68 years ago. She has a 10.5 pack-year smoking history. She has never used smokeless tobacco. She reports that she does not drink alcohol and does not use drugs.   Family History:  The patient's family history includes Diabetes in her maternal grandfather and mother; Emphysema in her paternal aunt and paternal uncle; Esophageal cancer (age of onset: 50) in her brother; Healthy in her child; Heart attack in her father; Heart disease in her maternal grandfather; Heart failure in her maternal grandfather; Rheum arthritis in her sister.    ROS:   Noted in current history, all other systems are negative.   Physical Exam: Blood pressure 128/64, pulse (!) 52, height 5\' 2"  (1.575 m), weight 172 lb 12.8 oz (78.4 kg), SpO2 97%.       GEN:  Well nourished, well developed in no acute distress HEENT: Normal NECK: No JVD; No carotid bruits LYMPHATICS: No lymphadenopathy CARDIAC: RRR soft systolic murmur   RESPIRATORY:  Clear to  auscultation without rales, wheezing or rhonchi  ABDOMEN: Soft, non-tender, non-distended MUSCULOSKELETAL:  No edema; No deformity  SKIN: Warm and dry NEUROLOGIC:  Alert and oriented x 3     EKG:         Recent Labs: 09/09/2022: TSH 0.718 09/10/2022: ALT 11 09/23/2022: Magnesium 1.9 02/16/2023: B Natriuretic Peptide 722.0; Hemoglobin 11.7; Platelets 256 02/28/2023: BUN 30; Creatinine, Ser 1.01; Potassium 5.1; Sodium 140    Lipid Panel    Component Value Date/Time   CHOL 108 10/21/2020 0843   TRIG 125 10/21/2020 0843   HDL 27 (L) 10/21/2020 0843   CHOLHDL 4.0 10/21/2020 0843   CHOLHDL 6 12/19/2019 1437   VLDL 32.4 12/19/2019 1437   LDLCALC 58 10/21/2020 0843      Wt Readings from Last 3 Encounters:  05/03/23 172 lb 12.8 oz (78.4 kg)  03/17/23 174 lb (78.9 kg)  02/21/23 174 lb 3.2 oz (79 kg)      Other studies Reviewed: Additional studies/ records that were reviewed today include: . Review of the above records demonstrates:    ASSESSMENT AND PLAN:  1. Coronary artery disease-she has chronic chest pain.  This has not changed for years.  Heart catheterization from 2023 shows patent grafts with moderate to severe CAD  3. Hypertension -     blood pressure is well-controlled.  4. Hyperlipidemia -    5. COPD:       She she has severe reduction of her DLCO.  She thinks that her shortness of breath is gradually worsening.  She is scheduled to have an echocardiogram later this week.  I have suggested that she get back into see pulmonary to see if there is any changes with her lung function.       Current medicines are reviewed at length with the patient today.  The patient does not have concerns regarding medicines.  The following changes have been made:  no  change  Labs/ tests ordered today include:   No orders of the defined types were placed in this encounter.    Disposition:    follow up in 6 months       Kristeen Miss, MD  05/03/2023 1:34 PM    [facility] [address] [place], [place], [place]  [address] Phone: [phone]; Fax: [phone]

## 2023-05-03 ENCOUNTER — Ambulatory Visit: Payer: Medicare Other | Attending: Cardiovascular Disease | Admitting: Cardiovascular Disease

## 2023-05-03 ENCOUNTER — Encounter: Payer: Self-pay | Admitting: Cardiovascular Disease

## 2023-05-03 VITALS — BP 128/64 | HR 52 | Ht 62.0 in | Wt 172.8 lb

## 2023-05-03 DIAGNOSIS — J449 Chronic obstructive pulmonary disease, unspecified: Secondary | ICD-10-CM | POA: Diagnosis not present

## 2023-05-03 DIAGNOSIS — I2510 Atherosclerotic heart disease of native coronary artery without angina pectoris: Secondary | ICD-10-CM

## 2023-05-03 NOTE — Patient Instructions (Signed)
 Follow-Up: At [facility], you and your health needs are our priority.  As part of our continuing mission to provide you with exceptional heart care, we have created designated Provider Care Teams.  These Care Teams include your primary Cardiologist (physician) and Advanced Practice Providers (APPs -  Physician Assistants and Nurse Practitioners) who all work together to provide you with the care you need, when you need it.  Your next appointment:   6 month(s)  Provider:   Kristeen Miss, MD or APP   1st Floor: - Lobby - Registration  - Pharmacy  - Lab - Cafe  2nd Floor: - PV Lab - Diagnostic Testing (echo, CT, nuclear med)  3rd Floor: - Vacant  4th Floor: - TCTS (cardiothoracic surgery) - AFib Clinic - Structural Heart Clinic - Vascular Surgery  - Vascular Ultrasound  5th Floor: - HeartCare Cardiology (general and EP) - Clinical Pharmacy for coumadin, hypertension, lipid, weight-loss medications, and med management appointments    Valet parking services will be available as well.

## 2023-05-05 ENCOUNTER — Ambulatory Visit (HOSPITAL_COMMUNITY)
Admission: RE | Admit: 2023-05-05 | Discharge: 2023-05-05 | Disposition: A | Discharge status: Home / Self Care | Payer: Medicare Other | Source: Ambulatory Visit | Attending: Cardiology | Admitting: Cardiology

## 2023-05-05 DIAGNOSIS — R0602 Shortness of breath: Secondary | ICD-10-CM | POA: Diagnosis present

## 2023-05-05 DIAGNOSIS — R072 Precordial pain: Secondary | ICD-10-CM

## 2023-05-05 LAB — ECHOCARDIOGRAM COMPLETE
AR max vel: 1.64 cm2
AV Area VTI: 1.6 cm2
AV Area mean vel: 1.59 cm2
AV Mean grad: 6.5 mmHg
AV Peak grad: 11.8 mmHg
Ao pk vel: 1.72 m/s
Area-P 1/2: 2.08 cm2
MV VTI: 1.48 cm2
S' Lateral: 2.93 cm

## 2023-05-20 NOTE — Progress Notes (Deleted)
 NEUROLOGY FOLLOW UP OFFICE NOTE  [name] [id number]  Assessment/Plan:   Chronic daily headache - suspect related to hypoxia.   Recommended that she speak with her pulmonologist about needing to use O2 during the day, which may help with the headaches that continue during the day. If ineffective, would retry divaloproex 250mg  daily   Subjective:  [name] is a 83 year old woman with coronary artery disease, COPD, hypertension, type 1 diabetes mellitus, hyperlipidemia, B12 deficiency and obstructive sleep apnea and tension-type headache who follows up for dizziness and headache.   UPDATE: O2 during day?  ***  Headaches are unchanged. Intensity:  Moderate to severe Duration:  Wakes up at 5 AM with headache.  Takes Tylenol and resolves by 11 AM.  However, it returns in the evening. Frequency:  Daily Frequency of abortive medication: Tylenol every 4 hours daily Current NSAIDS:  ASA 81mg  Current analgesics:  Tylenol Current triptans:  no Current ergotamine:  no Current anti-emetic:  Promethazine 25mg  Current muscle relaxants:  Baclofen 40mg  twice daily Current anti-anxiolytic:  Klonopin Current sleep aide:  trazodone Current Antihypertensive medications:  Toprol XL 50mg , Cardizem, Lasix, Imdur Current Antidepressant medications:  none Current Anticonvulsant medications:  none Current anti-CGRP:  no Current Vitamins/Herbal/Supplements:  B12 Current Antihistamines/Decongestants:  meclizine, Allegra, Singulair Other therapy:  ice pack   Caffeine:  1 cup coffee daily, tea Alcohol:  no Smoker:  no Diet:  hydrates Exercise:  yes Depression:  no; Anxiety:  no Other pain:  no Sleep hygiene:  Poor.  Untreated OSA.   She was taken off of CPAP by her prior sleep specialist because of reported noncompliance.  However, [name] insists she wore the mask all night and headaches were improved while she used the machine.     HISTORY: Onset:  She has history of migraines as  a young woman which resolved after having children.  Current headaches started in 2016. Location:  She has constant holocephalic headache.  She has severe headache radiating from the base of her neck on the right up Quality:  pounding Initial Intensity:  10/10 Aura:  no Prodrome:  no Postdrome:  no Associated symptoms:  None.  No nausea, vomiting, photophobia, phonophobia or visual disturbance.  She has not had any new worse headache of her life, waking up from sleep Initial Duration:  All day Initial Frequency:  Daily headache, severe  Headache every other day Initial Frequency of abortive medication: daily Triggers:  None Relieving factors:  None Activity:  aggravates  In August 2024, she was admitted to the hospital for hypoxia and altered mental status.  Found to be lethargic and unresponsive.  MRI of brain personally reviewed revealed no acute findings.  Labs negative for infection, ammonia less than 10, TSH and LFTs WNL.  EEG showed diffuse slowing consistent with encephalopathy.  Possible aspiration pneumonia and polypharmacy suspected.  She was found to be significantly hypoxic at night in her sleep.  She now has to sleep with home O2.  Now, she lacks energy.  She wants to sit all of the time.  Activity causes shortness of breath.  Takes all day to clean just one room.     Past NSAIDS:  Ibuprofen, naproxen (caused increased heart rate) Past analgesics:  Excedrin, tramadol and other opioids (adverse reaction) Past abortive triptans:  no Past muscle relaxants:  Robaxin, Flexeril Past anti-emetic:  Promethazine (effective) Past antihypertensive medications:  losartan Past antidepressant medications:  nortriptyline (prolonged QT interval) Past anticonvulsant medications:  Topiramate, Gabapentin 200mg  (  drowsiness) Past vitamins/Herbal/Supplements:  CoQ10 Past antihistamines/decongestants:  no Other past therapy:  Physical therapy of neck.   Family history of headache:  father    History of vertigo.  Seen in ED in July 2022.  MRI of brain revealed empty sella but otherwise no acute intracranial abnormality.  Occurs while walking or standing.  Not associated with quick head movements or turning over in bed.  Went to vestibular rehab which was ineffective.    MRI of brain without contrast from 09/16/14 revealed partially empty sella but otherwise unremarkable.   MRI of cervical spine from 09/16/14 demonstrated multilevel degenerative changes including left greater than right spurring at C3-C4 causing left foraminal narrowing, severe spurring at C4-C5 causing right moderate foraminal narrowing, and disc bulg and spurring at C6-C7 with moderate bilateral foraminal narrowing.  She was evaluated by Dr. Ethelene Hal for interventional pain (epidural or facet joint injections).  He didn't think it was a good idea given that she would have to stop Plavix.  CT head and c-spine in November 2023 showed arthritis in neck but no acute findings.  PAST MEDICAL HISTORY: Past Medical History:  Diagnosis Date   Adenomatous colon polyp    Allergy    Anxiety    Arthritis    Asthma    Carotid artery disease (HCC)    carotid US 02/2017: bilat ICA 1-39%   Chronic diastolic CHF    Echo 02/2017: EF 65-70, Gr 2 DD, mild MS (mean 5), PASP 44   COPD    pt is unsure if has been officially diagnosed   Coronary artery disease    CABG '09- cathed 12/09, 9/10, 6/11, 3/14 and 12/13/16- medical Rx // cath 12/2016 - 2/3 grafts patent >> med Rx // Myoview 12/17: low risk   Diabetes mellitus    Dyspnea    with exertion   Gastroesophageal reflux disease    Headache    Hiatal hernia    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     albuterol (PROVENTIL HFA;VENTOLIN HFA) 108 (90 BASE) MCG/ACT inhaler Inhale 2 puffs into the lungs every 6 (six) hours as needed. For shortness of breath. 1 Inhaler 0   albuterol (PROVENTIL) (2.5 MG/3ML) 0.083% nebulizer solution Take 3 mLs (2.5 mg total) by nebulization every 6 (six) hours as needed for wheezing or shortness of breath. 360 mL 5   aspirin EC 81 MG tablet Take 1 tablet (81 mg total) by mouth daily.     calcium carbonate (TUMS - DOSED IN MG ELEMENTAL CALCIUM) 500 MG chewable tablet Chew 1 tablet by mouth daily as needed for indigestion or heartburn.     Cholecalciferol (VITAMIN D) 50 MCG (2000 UT) tablet Take 4,000 Units by mouth daily.     Continuous Glucose Sensor (FREESTYLE LIBRE 3 PLUS SENSOR) MISC 1 Device by Other route every 14 (fourteen) days. Change sensor every 15 days. 6 each 3   Cyanocobalamin (VITAMIN B-12 IJ) Inject 1,000  mcg as directed every 30 (thirty) days.     diltiazem (CARDIZEM CD) 240 MG 24 hr capsule Take 1 capsule (240 mg total) by mouth daily. 90 capsule 2   divalproex (DEPAKOTE ER) 250 MG 24 hr tablet TAKE 1 TABLET (250 MG TOTAL) BY MOUTH AT BEDTIME. 90 tablet 1   fluticasone (FLONASE) 50 MCG/ACT nasal spray SPRAY 2 SPRAYS INTO EACH NOSTRIL EVERY DAY 48 mL 3   Fluticasone-Umeclidin-Vilant (TRELEGY ELLIPTA) 200-62.5-25 MCG/ACT AEPB Inhale 1 puff into the lungs daily at 6 (six) AM. 60 each 5   furosemide (LASIX) 20 MG tablet Take 1 tablet (20 mg total) by mouth daily. Take 40 mg for the next 3 days then take 20 mg once a day. 93 tablet 3   Glucagon (BAQSIMI ONE PACK) 3 MG/DOSE POWD Administer 1 spray into affected nostril(s) as needed for hypoglycemic emergencies.     insulin lispro protamine-lispro (HUMALOG 75/25 MIX) (75-25) 100 UNIT/ML SUSP injection Inject 0.06 mLs (6 Units total)  into the skin daily with breakfast AND 0.08 mLs (8 Units total) daily with supper. 10 units at lunch depending on bs reading. 15 mL 3   Insulin Syringe-Needle U-100 (INSULIN SYRINGE .3CC/29GX1") 29G X 1" 0.3 ML MISC 1 Device by Does not apply route in the morning and at bedtime. 200 each 3   isosorbide mononitrate (IMDUR) 60 MG 24 hr tablet Take one tablet ( 60 mg ) twice daily 8 hours apart. 180 tablet 3   meclizine (ANTIVERT) 25 MG tablet Take 25 mg by mouth 2 (two) times daily as needed for dizziness.     meloxicam (MOBIC) 15 MG tablet Take 15 mg by mouth daily.     metoprolol succinate (TOPROL-XL) 100 MG 24 hr tablet Take 100 mg by mouth daily.     Multiple Vitamin (MULTIVITAMIN WITH MINERALS) TABS Take 1 tablet by mouth daily.     neomycin-polymyxin b-dexamethasone (MAXITROL) 3.5-10000-0.1 SUSP SMARTSIG:In Eye(s)     nitroGLYCERIN (NITROSTAT) 0.4 MG SL tablet Place 1 tablet (0.4 mg total) under the tongue every 5 (five) minutes as needed for chest pain. 25 tablet 3   ondansetron (ZOFRAN) 4 MG tablet TAKE 1 TABLET BY MOUTH EVERY 8 HOURS AS NEEDED FOR NAUSEA AND VOMITING 20 tablet 1   ONETOUCH VERIO test strip USE AS INSTRUCTED TO TEST BLOOD SUGARS UP TO 4 TIMES DAILY 200 strip 12   pantoprazole (PROTONIX) 40 MG tablet Take 1 tablet (40 mg total) by mouth daily. 90 tablet 2   sertraline (ZOLOFT) 100 MG tablet TAKE 1 TABLET BY MOUTH EVERY DAY 90 tablet 2   spironolactone (ALDACTONE) 25 MG tablet Take 1 tablet (25 mg total) by mouth daily. 90 tablet 1   temazepam (RESTORIL) 30 MG capsule Take 1 tablet by mouth daily.     tiZANidine (ZANAFLEX) 2 MG tablet Take 1-2 tablets (2-4 mg total) by mouth at bedtime as needed for muscle spasms. 60 tablet 5   Current Facility-Administered Medications on File Prior to Visit  Medication Dose Route Frequency Provider Last Rate Last Admin   Study - ORION 4 - inclisiran 300 mg/1.91mL or placebo SQ injection (PI-Stuckey)  300 mg Subcutaneous Q6 months    300 mg at  12/24/22 1150    ALLERGIES: Allergies  Allergen Reactions   Amlodipine Other (See Comments)    hallucinations    Banana Nausea And Vomiting    Stomach pumped   Co Q10 [Coenzyme Q10] Other (See Comments)    Body cramps   Codeine  Other (See Comments)    Hallucinate, loose identity and don't know who I am   Insulin Aspart (Human Analog) Other (See Comments)    Unknown   Lisinopril Other (See Comments)    nose bleed   Metformin And Related Nausea And Vomiting   Morphine Other (See Comments)    Can not function, it immobilizes me    Pentazocine Nausea And Vomiting   Pravastatin Other (See Comments)    Hands locked up   Repatha [Evolocumab] Other (See Comments)    myalgias   Statins Other (See Comments)    Muscle cramps   Sulfa Antibiotics Swelling   Sulfonamide Derivatives Swelling   Tramadol Hcl Other (See Comments)    Dizziness, cant function     FAMILY HISTORY: Family History  Problem Relation Age of Onset   Heart disease Maternal Grandfather    Heart failure Maternal Grandfather    Diabetes Maternal Grandfather    Heart attack Father    Diabetes Mother    Rheum arthritis Sister    Emphysema Paternal Uncle    Esophageal cancer Brother 110       she said he was born with it   Emphysema Paternal Aunt    Healthy Child    Neuropathy Neg Hx    Multiple sclerosis Neg Hx    Colon cancer Neg Hx    Colon polyps Neg Hx    Rectal cancer Neg Hx    Stomach cancer Neg Hx       Objective:  Blood pressure 122/71, pulse 95, height 5\' 2"  (1.575 m), weight 164 lb (74.4 kg), SpO2 96%. General: No acute distress.  Patient appears well-groomed.   Head:  Normocephalic/atraumatic Eyes:  Fundi examined but not visualized Neck: supple, no paraspinal tenderness, full range of motion Heart:  Regular rate and rhythm Neurological Exam: alert and oriented.  Speech fluent and not dysarthric, language intact.  CN II-XII intact. Bulk and tone normal, muscle strength 5/5 throughout.   Sensation to light touch intact.  Deep tendon reflexes 2+ throughout.  Finger to nose testing intact.  Gait cautious but steady. Romberg negative.    Shon Millet, DO  CC: Cheryll Cockayne, MD

## 2023-05-23 ENCOUNTER — Ambulatory Visit: Payer: Medicare Other | Admitting: Neurology

## 2023-05-27 ENCOUNTER — Telehealth: Payer: Self-pay

## 2023-05-27 NOTE — Telephone Encounter (Signed)
-----   Message from Katlyn D West sent at 05/06/2023  1:19 PM EDT ----- Please let [name] know that her echocardiogram showed normal heart squeeze and function. There is some mild stiffening and thickening of the left lower chamber of the heart, this is a common finding with aging and a history of hypertension. Overall no significant change from echo in 2023.

## 2023-05-27 NOTE — Telephone Encounter (Signed)
 Reached out to patient for results advised of below patient verbalized understanding

## 2023-05-27 NOTE — Telephone Encounter (Signed)
 Was calling patient on their echo results. After giving the results of the echo to the patient, Patient notified me that they are currently having chest pain.  Patient stated that the chest pain was an 8 out of 10  Patient did say they were SOB When asked to described the pain they stated that the pain was left sided does not radiated down the arm or up her neck. Patient stated that the pain has been on and off all day lasting 7 minutes and pain will go away on it's own then after 45 minutes the pain comes back at the same intensity and lasting same amount of time. Patient did take a nitroglycerin  this morning, with improvement in symptoms, Advised patient if they were having further chest pain to take nitroglycerin  as prescribed. Advised patient to present to emergency department for further evaluation. Patient stated if the do go to the emergency room they will go to [facility]. Katlyn West NP Agreed with recommendation of nitroglycerin  and recommending patient present to ED for further evaluation.

## 2023-06-22 ENCOUNTER — Encounter: Payer: Medicare Other | Admitting: *Deleted

## 2023-06-22 DIAGNOSIS — Z006 Encounter for examination for normal comparison and control in clinical research program: Secondary | ICD-10-CM

## 2023-06-22 MED ORDER — STUDY - ORION 4 - INCLISIRAN 300 MG/1.5 ML OR PLACEBO SQ INJECTION (PI-STUCKEY)
300.0000 mg | INJECTION | Freq: Once | SUBCUTANEOUS | Status: AC
  Administered 2023-06-22: 300 mg via SUBCUTANEOUS
  Filled 2023-06-22: qty 1.5

## 2023-06-22 NOTE — Progress Notes (Unsigned)
 NEUROLOGY FOLLOW UP OFFICE NOTE  ZALI RODA [id number]  Assessment/Plan:   Chronic daily headache - suspect related to hypoxia.   Recommended that she speak with her pulmonologist about needing to use O2 during the day, which may help with the headaches that continue during the day. If ineffective, would retry divaloproex 250mg  daily   Subjective:  Leen Trippel is a 83 year old woman with coronary artery disease, COPD, hypertension, type 1 diabetes mellitus, hyperlipidemia, B12 deficiency and obstructive sleep apnea and tension-type headache who follows up for dizziness and headache.   UPDATE: O2 during day?  ***  Headaches are unchanged. Intensity:  Moderate to severe Duration:  Wakes up at 5 AM with headache.  Takes Tylenol  and resolves by 11 AM.  However, it returns in the evening. Frequency:  Daily Frequency of abortive medication: Tylenol  every 4 hours daily Current NSAIDS:  ASA 81mg  Current analgesics:  Tylenol  Current triptans:  no Current ergotamine:  no Current anti-emetic:  Promethazine  25mg  Current muscle relaxants:  Baclofen  40mg  twice daily Current anti-anxiolytic:  Klonopin  Current sleep aide:  trazodone  Current Antihypertensive medications:  Toprol  XL 50mg , Cardizem , Lasix , Imdur  Current Antidepressant medications:  none Current Anticonvulsant medications:  none Current anti-CGRP:  no Current Vitamins/Herbal/Supplements:  B12 Current Antihistamines/Decongestants:  meclizine , Allegra , Singulair  Other therapy:  ice pack   Caffeine:  1 cup coffee daily, tea Alcohol:  no Smoker:  no Diet:  hydrates Exercise:  yes Depression:  no; Anxiety:  no Other pain:  no Sleep hygiene:  Poor.  Untreated OSA.   She was taken off of CPAP by her prior sleep specialist because of reported noncompliance.  However, Ms. Niese insists she wore the mask all night and headaches were improved while she used the machine.     HISTORY: Onset:  She has history of migraines as  a young woman which resolved after having children.  Current headaches started in 2016. Location:  She has constant holocephalic headache.  She has severe headache radiating from the base of her neck on the right up Quality:  pounding Initial Intensity:  10/10 Aura:  no Prodrome:  no Postdrome:  no Associated symptoms:  None.  No nausea, vomiting, photophobia, phonophobia or visual disturbance.  She has not had any new worse headache of her life, waking up from sleep Initial Duration:  All day Initial Frequency:  Daily headache, severe  Headache every other day Initial Frequency of abortive medication: daily Triggers:  None Relieving factors:  None Activity:  aggravates  In August 2024, she was admitted to the hospital for hypoxia and altered mental status.  Found to be lethargic and unresponsive.  MRI of brain personally reviewed revealed no acute findings.  Labs negative for infection, ammonia less than 10, TSH and LFTs WNL.  EEG showed diffuse slowing consistent with encephalopathy.  Possible aspiration pneumonia and polypharmacy suspected.  She was found to be significantly hypoxic at night in her sleep.  She now has to sleep with home O2.  Now, she lacks energy.  She wants to sit all of the time.  Activity causes shortness of breath.  Takes all day to clean just one room.     Past NSAIDS:  Ibuprofen, naproxen (caused increased heart rate) Past analgesics:  Excedrin, tramadol  and other opioids (adverse reaction) Past abortive triptans:  no Past muscle relaxants:  Robaxin , Flexeril  Past anti-emetic:  Promethazine  (effective) Past antihypertensive medications:  losartan  Past antidepressant medications:  nortriptyline  (prolonged QT interval) Past anticonvulsant medications:  Topiramate , Gabapentin  200mg  (  drowsiness) Past vitamins/Herbal/Supplements:  CoQ10 Past antihistamines/decongestants:  no Other past therapy:  Physical therapy of neck.   Family history of headache:  father    History of vertigo.  Seen in ED in July 2022.  MRI of brain revealed empty sella but otherwise no acute intracranial abnormality.  Occurs while walking or standing.  Not associated with quick head movements or turning over in bed.  Went to vestibular rehab which was ineffective.    MRI of brain without contrast from 09/16/14 revealed partially empty sella but otherwise unremarkable.   MRI of cervical spine from 09/16/14 demonstrated multilevel degenerative changes including left greater than right spurring at C3-C4 causing left foraminal narrowing, severe spurring at C4-C5 causing right moderate foraminal narrowing, and disc bulg and spurring at C6-C7 with moderate bilateral foraminal narrowing.  She was evaluated by Dr. Rexanne Catalina for interventional pain (epidural or facet joint injections).  He didn't think it was a good idea given that she would have to stop Plavix .  CT head and c-spine in November 2023 showed arthritis in neck but no acute findings.  PAST MEDICAL HISTORY: Past Medical History:  Diagnosis Date   Adenomatous colon polyp    Allergy     Anxiety    Arthritis    Asthma    Carotid artery disease (HCC)    carotid US  02/2017: bilat ICA 1-39%   Chronic diastolic CHF    Echo 02/2017: EF 65-70, Gr 2 DD, mild MS (mean 5), PASP 44   COPD    pt is unsure if has been officially diagnosed   Coronary artery disease    CABG '09- cathed 12/09, 9/10, 6/11, 3/14 and 12/13/16- medical Rx // cath 12/2016 - 2/3 grafts patent >> med Rx // Myoview  12/17: low risk   Diabetes mellitus    Dyspnea    with exertion   Gastroesophageal reflux disease    Headache    Hiatal hernia    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     albuterol  (PROVENTIL  HFA;VENTOLIN  HFA) 108 (90 BASE) MCG/ACT inhaler Inhale 2 puffs into the lungs every 6 (six) hours as needed. For shortness of breath. 1 Inhaler 0   albuterol  (PROVENTIL ) (2.5 MG/3ML) 0.083% nebulizer solution Take 3 mLs (2.5 mg total) by nebulization every 6 (six) hours as needed for wheezing or shortness of breath. 360 mL 5   aspirin  EC 81 MG tablet Take 1 tablet (81 mg total) by mouth daily.     calcium  carbonate (TUMS - DOSED IN MG ELEMENTAL CALCIUM ) 500 MG chewable tablet Chew 1 tablet by mouth daily as needed for indigestion or heartburn.     Cholecalciferol  (VITAMIN D ) 50 MCG (2000 UT) tablet Take 4,000 Units by mouth daily.     Continuous Glucose Sensor (FREESTYLE LIBRE 3 PLUS SENSOR) MISC 1 Device by Other route every 14 (fourteen) days. Change sensor every 15 days. 6 each 3   Cyanocobalamin  (VITAMIN B-12 IJ) Inject 1,000  mcg as directed every 30 (thirty) days. (Patient not taking: Reported on 06/22/2023)     diclofenac  Sodium (VOLTAREN ) 1 % GEL Apply topically.     diltiazem  (CARDIZEM  CD) 240 MG 24 hr capsule Take 1 capsule (240 mg total) by mouth daily. 90 capsule 2   divalproex  (DEPAKOTE  ER) 250 MG 24 hr tablet TAKE 1 TABLET (250 MG TOTAL) BY MOUTH AT BEDTIME. 90 tablet 1   fluticasone  (FLONASE ) 50 MCG/ACT nasal spray SPRAY 2 SPRAYS INTO EACH NOSTRIL EVERY DAY 48 mL 3   Fluticasone -Umeclidin-Vilant (TRELEGY ELLIPTA ) 200-62.5-25 MCG/ACT AEPB Inhale 1 puff into the lungs daily at 6 (six) AM. 60 each 5   furosemide  (LASIX ) 20 MG tablet Take 1 tablet (20 mg total) by mouth daily. Take 40 mg for the next 3 days then take 20 mg once a day. 93 tablet 3   Glucagon (BAQSIMI ONE PACK) 3 MG/DOSE POWD Administer 1 spray into affected nostril(s) as needed for hypoglycemic emergencies.     insulin  lispro  protamine-lispro (HUMALOG  75/25 MIX) (75-25) 100 UNIT/ML SUSP injection Inject 0.06 mLs (6 Units total) into the skin daily with breakfast AND 0.08 mLs (8 Units total) daily with supper. 10 units at lunch depending on bs reading. 15 mL 3   Insulin  Syringe-Needle U-100 (INSULIN  SYRINGE .3CC/29GX1") 29G X 1" 0.3 ML MISC 1 Device by Does not apply route in the morning and at bedtime. 200 each 3   isosorbide  mononitrate (IMDUR ) 60 MG 24 hr tablet Take one tablet ( 60 mg ) twice daily 8 hours apart. 180 tablet 3   meclizine  (ANTIVERT ) 25 MG tablet Take 25 mg by mouth 2 (two) times daily as needed for dizziness.     meloxicam (MOBIC) 15 MG tablet Take 15 mg by mouth daily.     metoprolol  succinate (TOPROL -XL) 100 MG 24 hr tablet Take 100 mg by mouth daily.     Multiple Vitamin (MULTIVITAMIN WITH MINERALS) TABS Take 1 tablet by mouth daily.     neomycin-polymyxin b -dexamethasone  (MAXITROL) 3.5-10000-0.1 SUSP SMARTSIG:In Eye(s) (Patient not taking: Reported on 06/22/2023)     nitroGLYCERIN  (NITROSTAT ) 0.4 MG SL tablet Place 1 tablet (0.4 mg total) under the tongue every 5 (five) minutes as needed for chest pain. 25 tablet 3   ondansetron  (ZOFRAN ) 4 MG tablet TAKE 1 TABLET BY MOUTH EVERY 8 HOURS AS NEEDED FOR NAUSEA AND VOMITING (Patient not taking: Reported on 06/22/2023) 20 tablet 1   ONETOUCH VERIO test strip USE AS INSTRUCTED TO TEST BLOOD SUGARS UP TO 4 TIMES DAILY (Patient not taking: Reported on 06/22/2023) 200 strip 12   pantoprazole  (PROTONIX ) 40 MG tablet Take 1 tablet (40 mg total) by mouth daily. (Patient not taking: Reported on 06/22/2023) 90 tablet 2   sertraline  (ZOLOFT ) 100 MG tablet TAKE 1 TABLET BY MOUTH EVERY DAY 90 tablet 2   spironolactone  (ALDACTONE ) 25 MG tablet Take 1 tablet (25 mg total) by mouth daily. 90 tablet 1   temazepam  (RESTORIL ) 30 MG capsule Take 1 tablet by mouth daily.     tiZANidine  (ZANAFLEX ) 2 MG tablet Take 1-2 tablets (2-4 mg total) by mouth at bedtime as needed for muscle  spasms. 60 tablet 5   Current Facility-Administered Medications on File Prior to Visit  Medication Dose Route Frequency Provider Last Rate Last Admin   Study - ORION 4 - inclisiran 300 mg/1.6mL or placebo SQ injection (PI-Stuckey)  300 mg Subcutaneous Once         ALLERGIES: Allergies  Allergen Reactions  Amlodipine Other (See Comments)    hallucinations    Banana Nausea And Vomiting    Stomach pumped   Co Q10 [Coenzyme Q10] Other (See Comments)    Body cramps   Codeine Other (See Comments)    Hallucinate, loose identity and don't know who I am   Insulin  Aspart (Human Analog) Other (See Comments)    Unknown   Lisinopril  Other (See Comments)    nose bleed   Metformin  And Related Nausea And Vomiting   Morphine Other (See Comments)    Can not function, it immobilizes me    Pentazocine Nausea And Vomiting   Pravastatin  Other (See Comments)    Hands locked up   Repatha [Evolocumab] Other (See Comments)    myalgias   Statins Other (See Comments)    Muscle cramps   Sulfa Antibiotics Swelling   Sulfonamide Derivatives Swelling   Tramadol  Hcl Other (See Comments)    Dizziness, cant function     FAMILY HISTORY: Family History  Problem Relation Age of Onset   Heart disease Maternal Grandfather    Heart failure Maternal Grandfather    Diabetes Maternal Grandfather    Heart attack Father    Diabetes Mother    Rheum arthritis Sister    Emphysema Paternal Uncle    Esophageal cancer Brother 24       she said he was born with it   Emphysema Paternal Aunt    Healthy Child    Neuropathy Neg Hx    Multiple sclerosis Neg Hx    Colon cancer Neg Hx    Colon polyps Neg Hx    Rectal cancer Neg Hx    Stomach cancer Neg Hx       Objective:  Blood pressure 122/71, pulse 95, height 5\' 2"  (1.575 m), weight 164 lb (74.4 kg), SpO2 96%. General: No acute distress.  Patient appears well-groomed.   Head:  Normocephalic/atraumatic Eyes:  Fundi examined but not visualized Neck: supple,  no paraspinal tenderness, full range of motion Heart:  Regular rate and rhythm Neurological Exam: alert and oriented.  Speech fluent and not dysarthric, language intact.  CN II-XII intact. Bulk and tone normal, muscle strength 5/5 throughout.  Sensation to light touch intact.  Deep tendon reflexes 2+ throughout.  Finger to nose testing intact.  Gait cautious but steady. Romberg negative.    Janne Members, DO  CC: Oma Bias, MD

## 2023-06-22 NOTE — Research (Signed)
 [facility] 4   Patient doing well, no chest pains, swelling, or shortness of breath.  No med changes.  Will see patient back in 6 months.  Nov 12 @ 11am  Bernett Brill :) RN BSN  Clinical Research Nurse  Be strong and take heart, all you who hope in the [place]. ~ Psalm 31:24

## 2023-06-23 ENCOUNTER — Encounter: Payer: Self-pay | Admitting: Neurology

## 2023-06-23 ENCOUNTER — Ambulatory Visit (INDEPENDENT_AMBULATORY_CARE_PROVIDER_SITE_OTHER): Admitting: Neurology

## 2023-06-23 VITALS — BP 136/71 | HR 62 | Ht 62.0 in | Wt 177.0 lb

## 2023-06-23 DIAGNOSIS — R519 Headache, unspecified: Secondary | ICD-10-CM

## 2023-06-23 NOTE — Patient Instructions (Addendum)
 I am still concerned that the headaches are related to decreased oxygen  to the head.  Please follow up with your pulmonologist, Dr. Phyllis Breeze [name] [address] [place], [place] [address] [phone] Follow up with me in 6 months.

## 2023-07-15 ENCOUNTER — Ambulatory Visit: Payer: Medicare Other | Admitting: Internal Medicine

## 2023-07-18 ENCOUNTER — Ambulatory Visit: Admitting: Internal Medicine

## 2023-07-18 NOTE — Progress Notes (Deleted)
 Name: [name]  MRN/ DOB: [id number], 08-09-1940   Age/ Sex: 83 y.o., female    PCP: Colene Dauphin, MD   Reason for Endocrinology Evaluation: Type 1 Diabetes Mellitus     Date of Initial Endocrinology Visit: 12/18/2021    PATIENT IDENTIFIER: [name] is a 83 y.o. female with a past medical history of DM, CAD and OSA. The patient presented for initial endocrinology clinic visit on 12/18/2021  for consultative assistance with her diabetes management.    HPI: [name] was    Diagnosed with DM 1986 Prior Medications tried/Intolerance: Metformin - Vomiting  Hemoglobin A1c has ranged from 6.9% in 2009, peaking at 9.5% in 2023.   Used to follow-up with Dr. Ronelle Coffee   On her initial visit to our clinic her A1c was 6.9%, she was taking Humalog  Mix 3 times daily, this was adjusted to twice daily    SUBJECTIVE:   During the last visit (03/17/2023): A1c 7.8%    Today (07/18/23): [name] is here for a follow up on diabetes management. She checks her blood sugars multiple times daily through the freestyle libre. [name] hypoglycemia  Patient is under the care of cardiology for evaluation of bradycardia, CAD and CHF presented to the ED 02/16/2023 with low heart rate in the 40s.  Beta-blockers were discontinued She follows with Ortho for carpal tunnel syndrome   Denies nausea or vomiting  Denies constipation or diarrhea   HOME DIABETES REGIMEN: Humalog  mix 6 units With breakfast,  and 6 units with Supper       Freestyle Libre2: unable to download 122-250      DIABETIC COMPLICATIONS: Microvascular complications:   Denies: CKD, retinopathy Last eye exam: Completed 11/2021  Macrovascular complications:  CAD Denies:  PVD, CVA   PAST HISTORY: Past Medical History:  Past Medical History:  Diagnosis Date   Adenomatous colon polyp    Allergy     Anxiety    Arthritis    Asthma    Carotid artery disease (HCC)    carotid US  02/2017: bilat ICA 1-39%    Chronic diastolic CHF    Echo 02/2017: EF 65-70, Gr 2 DD, mild MS (mean 5), PASP 44   COPD    pt is unsure if has been officially diagnosed   Coronary artery disease    CABG '09- cathed 12/09, 9/10, 6/11, 3/14 and 12/13/16- medical Rx // cath 12/2016 - 2/3 grafts patent >> med Rx // Myoview  12/17: low risk   Diabetes mellitus    Dyspnea    with exertion   Gastroesophageal reflux disease    Headache    Hiatal hernia    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   Hyperlipidemia    Hypertension    Myocardial infarction (HCC)    PONV (postoperative nausea and vomiting)    Schatzki's ring    Shoulder injury    resolved after shoulder surgery   Sleep apnea    not on cpap   Past Surgical History:  Past Surgical History:  Procedure Laterality Date   ABDOMINAL HYSTERECTOMY     APPENDECTOMY     came out with Hysterectomy   CARDIAC CATHETERIZATION  07/23/2009   EF 60%   CARDIAC CATHETERIZATION  10/11/2008   CARDIAC CATHETERIZATION  03/01/2007   EF 75-80%   CARDIOVASCULAR STRESS TEST  11/15/2007   EF 60%   COLONOSCOPY     CORONARY ARTERY BYPASS GRAFT     SEVERELY DISEASED SAPHENOUS VEIN  GRAFT TO THE RIGHT CORONARY ARTERY BUT WITH FAIRLY WELL PRESERVED FLOW TO THE DISTAL RIGHT CORONARY ARTERY FROM THE NATIVE CIRCULATION-RESTART  CATH IN JUNE 2000, REVEALS MILD/MODERATE  CAD WITH GOOD FLOW DOWN HER LAD   ESOPHAGOGASTRODUODENOSCOPY     EYE SURGERY     bilateral cataract surgery with lens implant   LEFT HEART CATH AND CORS/GRAFTS ANGIOGRAPHY N/A 12/14/2016   Procedure: LEFT HEART CATH AND CORS/GRAFTS ANGIOGRAPHY;  Surgeon: Lucendia Rusk, MD;  Location: [facility];  Service: Cardiovascular;  Laterality: N/A;   lense removal Left    POLYPECTOMY     REVERSE SHOULDER ARTHROPLASTY Left 06/25/2022   Procedure: REVERSE SHOULDER ARTHROPLASTY;  Surgeon: Winston Hawking, MD;  Location: [facility];  Service: Orthopedics;  Laterality: Left;  general and choice with interscalene block    RIGHT/LEFT HEART CATH AND CORONARY/GRAFT ANGIOGRAPHY N/A 07/28/2021   Procedure: RIGHT/LEFT HEART CATH AND CORONARY/GRAFT ANGIOGRAPHY;  Surgeon: Millicent Ally, MD;  Location: [facility];  Service: Cardiovascular;  Laterality: N/A;   ROTATOR CUFF REPAIR     right and left   TONSILLECTOMY     age 40   TOTAL KNEE ARTHROPLASTY Right 02/20/2014   Procedure: RIGHT TOTAL KNEE ARTHROPLASTY;  Surgeon: Florencia Hunter, MD;  Location: [facility];  Service: Orthopedics;  Laterality: Right;   tumor removed kidney     UPPER GASTROINTESTINAL ENDOSCOPY     US  ECHOCARDIOGRAPHY  03/08/2008   EF 55-60%    Social History:  reports that she quit smoking about 47 years ago. Her smoking use included cigarettes. She started smoking about 68 years ago. She has a 10.5 pack-year smoking history. She has never used smokeless tobacco. She reports that she does not drink alcohol and does not use drugs. Family History:  Family History  Problem Relation Age of Onset   Heart disease Maternal Grandfather    Heart failure Maternal Grandfather    Diabetes Maternal Grandfather    Heart attack Father    Diabetes Mother    Rheum arthritis Sister    Emphysema Paternal Uncle    Esophageal cancer Brother 32       she said he was born with it   Emphysema Paternal Aunt    Healthy Child    Neuropathy Neg Hx    Multiple sclerosis Neg Hx    Colon cancer Neg Hx    Colon polyps Neg Hx    Rectal cancer Neg Hx    Stomach cancer Neg Hx      HOME MEDICATIONS: Allergies as of 07/18/2023       Reactions   Amlodipine Other (See Comments)   hallucinations    Banana Nausea And Vomiting   Stomach pumped   Co Q10 [coenzyme Q10] Other (See Comments)   Body cramps   Codeine Other (See Comments)   Hallucinate, loose identity and don't know who I am   Insulin  Aspart (human Analog) Other (See Comments)   Unknown   Lisinopril  Other (See Comments)   nose bleed   Metformin  And Related Nausea And Vomiting   Morphine Other (See  Comments)   Can not function, it immobilizes me    Pentazocine Nausea And Vomiting   Pravastatin  Other (See Comments)   Hands locked up   Repatha [evolocumab] Other (See Comments)   myalgias   Statins Other (See Comments)   Muscle cramps   Sulfa Antibiotics Swelling   Sulfonamide Derivatives Swelling   Tramadol  Hcl Other (See Comments)   Dizziness,  cant function         Medication List        Accurate as of July 18, 2023  1:05 PM. If you have any questions, ask your nurse or doctor.          acetaminophen  325 MG tablet Commonly known as: TYLENOL  Take 650 mg by mouth every 6 (six) hours as needed for moderate pain (pain score 4-6) or headache.   albuterol  108 (90 Base) MCG/ACT inhaler Commonly known as: VENTOLIN  HFA Inhale 2 puffs into the lungs every 6 (six) hours as needed. For shortness of breath.   albuterol  (2.5 MG/3ML) 0.083% nebulizer solution Commonly known as: PROVENTIL  Take 3 mLs (2.5 mg total) by nebulization every 6 (six) hours as needed for wheezing or shortness of breath.   aspirin  EC 81 MG tablet Take 1 tablet (81 mg total) by mouth daily.   Baqsimi One Pack 3 MG/DOSE Powd Generic drug: Glucagon Administer 1 spray into affected nostril(s) as needed for hypoglycemic emergencies.   calcium  carbonate 500 MG chewable tablet Commonly known as: TUMS - dosed in mg elemental calcium  Chew 1 tablet by mouth daily as needed for indigestion or heartburn.   diclofenac  Sodium 1 % Gel Commonly known as: VOLTAREN  Apply topically.   diltiazem  240 MG 24 hr capsule Commonly known as: CARDIZEM  CD Take 1 capsule (240 mg total) by mouth daily.   divalproex  250 MG 24 hr tablet Commonly known as: DEPAKOTE  ER TAKE 1 TABLET (250 MG TOTAL) BY MOUTH AT BEDTIME.   fluticasone  50 MCG/ACT nasal spray Commonly known as: FLONASE  SPRAY 2 SPRAYS INTO EACH NOSTRIL EVERY DAY   FreeStyle Libre 3 Plus Sensor Misc 1 Device by Other route every 14 (fourteen) days. Change sensor  every 15 days.   furosemide  20 MG tablet Commonly known as: LASIX  Take 1 tablet (20 mg total) by mouth daily. Take 40 mg for the next 3 days then take 20 mg once a day.   insulin  lispro protamine-lispro (75-25) 100 UNIT/ML Susp injection Commonly known as: HUMALOG  75/25 MIX Inject 0.06 mLs (6 Units total) into the skin daily with breakfast AND 0.08 mLs (8 Units total) daily with supper. 10 units at lunch depending on bs reading.   INSULIN  SYRINGE .3CC/29GX1" 29G X 1" 0.3 ML Misc 1 Device by Does not apply route in the morning and at bedtime.   isosorbide  mononitrate 60 MG 24 hr tablet Commonly known as: IMDUR  Take one tablet ( 60 mg ) twice daily 8 hours apart.   meclizine  25 MG tablet Commonly known as: ANTIVERT  Take 25 mg by mouth 2 (two) times daily as needed for dizziness.   meloxicam 15 MG tablet Commonly known as: MOBIC Take 15 mg by mouth daily.   metoprolol  succinate 100 MG 24 hr tablet Commonly known as: TOPROL -XL Take 100 mg by mouth daily.   multivitamin with minerals Tabs tablet Take 1 tablet by mouth daily.   neomycin-polymyxin b -dexamethasone  3.5-10000-0.1 Susp Commonly known as: MAXITROL   nitroGLYCERIN  0.4 MG SL tablet Commonly known as: NITROSTAT  Place 1 tablet (0.4 mg total) under the tongue every 5 (five) minutes as needed for chest pain.   ondansetron  4 MG tablet Commonly known as: ZOFRAN  TAKE 1 TABLET BY MOUTH EVERY 8 HOURS AS NEEDED FOR NAUSEA AND VOMITING   OneTouch Verio test strip Generic drug: glucose blood USE AS INSTRUCTED TO TEST BLOOD SUGARS UP TO 4 TIMES DAILY   pantoprazole  40 MG tablet Commonly known as: PROTONIX  Take 1 tablet (  40 mg total) by mouth daily.   sertraline  100 MG tablet Commonly known as: ZOLOFT  TAKE 1 TABLET BY MOUTH EVERY DAY   spironolactone  25 MG tablet Commonly known as: ALDACTONE  Take 1 tablet (25 mg total) by mouth daily.   temazepam  30 MG capsule Commonly known as: RESTORIL  Take 1 tablet by mouth  daily.   tiZANidine  2 MG tablet Commonly known as: ZANAFLEX  Take 1-2 tablets (2-4 mg total) by mouth at bedtime as needed for muscle spasms.   Trelegy Ellipta  200-62.5-25 MCG/ACT Aepb Generic drug: Fluticasone -Umeclidin-Vilant Inhale 1 puff into the lungs daily at 6 (six) AM.   VITAMIN B-12 IJ Inject 1,000 mcg as directed every 30 (thirty) days.   Vitamin D  50 MCG (2000 UT) tablet Take 4,000 Units by mouth daily.         ALLERGIES: Allergies  Allergen Reactions   Amlodipine Other (See Comments)    hallucinations    Banana Nausea And Vomiting    Stomach pumped   Co Q10 [Coenzyme Q10] Other (See Comments)    Body cramps   Codeine Other (See Comments)    Hallucinate, loose identity and don't know who I am   Insulin  Aspart (Human Analog) Other (See Comments)    Unknown   Lisinopril  Other (See Comments)    nose bleed   Metformin  And Related Nausea And Vomiting   Morphine Other (See Comments)    Can not function, it immobilizes me    Pentazocine Nausea And Vomiting   Pravastatin  Other (See Comments)    Hands locked up   Repatha [Evolocumab] Other (See Comments)    myalgias   Statins Other (See Comments)    Muscle cramps   Sulfa Antibiotics Swelling   Sulfonamide Derivatives Swelling   Tramadol  Hcl Other (See Comments)    Dizziness, cant function      REVIEW OF SYSTEMS: A comprehensive ROS was conducted with the patient and is negative except as per HPI     OBJECTIVE:   VITAL SIGNS: LMP  (LMP Unknown)    PHYSICAL EXAM:  General: Pt appears well and is in NAD  Neck: General: Supple without adenopathy or carotid bruits. Thyroid : Thyroid  size normal.  [name] goiter or nodules appreciated.   Lungs: Clear with good BS bilat   Heart: RRR   Extremities:  Lower extremities - [name] pretibial edema.  Pt with tenderness at the right 2/3 MTH   Neuro: MS is good with appropriate affect, pt is alert and Ox3    Dm Foot Exam  10/05/2022   The skin of the feet is intact  without sores or ulcerations. The pedal pulses are 2+ on right and 2+ on left. The sensation is intact to a screening 5.07, 10 gram monofilament bilaterally   DATA REVIEWED:  Lab Results  Component Value Date   HGBA1C 7.8 (A) 03/17/2023   HGBA1C 7.3 (A) 10/05/2022   HGBA1C 6.6 (H) 06/09/2022     Latest Reference Range & Units 02/16/23 12:02  Sodium 135 - 145 mmol/L 132 (L)  Potassium 3.5 - 5.1 mmol/L 4.1  Chloride 98 - 111 mmol/L 97 (L)  CO2 22 - 32 mmol/L 30  Glucose 70 - 99 mg/dL 478 (H)  BUN 8 - 23 mg/dL 24 (H)  Creatinine 2.95 - 1.00 mg/dL 6.21  Calcium  8.9 - 10.3 mg/dL 9.0  Anion gap 5 - 15  5  GFR, Estimated >60 mL/min >60    Old records , labs and images have been reviewed.   ASSESSMENT /  PLAN / RECOMMENDATIONS:   1) Type 1 Diabetes Mellitus, Sub-Optimaly controlled, With macrovascular complications - Most recent A1c of 7.8 %. Goal A1c < 7.5 %.     -Patient with history of recurrent severe hypoglycemic episodes, and manually reviewing freestyle libre 2 that has been [name] hypoglycemic episodes. -The patient endorses hypoglycemia overnight, while reviewing her CGM, the patient had a BG reading of 161 Mg/DL at bedtime, but by this morning her BG was over 200, I will increase her suppertime of insulin  as below -Patient uses vials, does not wish to change to pens due to cost issues -I will upgrade her to freestyle libre 3 plus as she has been having issues with freestyle libre 2 -Emphasized the importance of taking insulin  before the meal rather than after the meal -She was also advised that she may reduce her insulin  dose by 50% if her appetite is low and she is not going to eat her typical size meal  MEDICATIONS: Change Humalog  mix 6 units before breakfast and 8 units before supper  EDUCATION / INSTRUCTIONS: BG monitoring instructions: Patient is instructed to check her blood sugars 3 times a day, before each meal. Call [facility] if: BG persistently  < 70  I reviewed the Rule of 15 for the treatment of hypoglycemia in detail with the patient. Literature supplied.   2) Diabetic complications:  Eye: Does not have known diabetic retinopathy.  Neuro/ Feet: Does not have known diabetic peripheral neuropathy. Renal: Patient does not have known baseline CKD. She is not on an ACEI/ARB at present.    Follow-up in 4 months    Signed electronically by: Natale Bail, MD  [facility] Endocrinology  [facility] [address] [place], [place] [address] Phone: [phone] FAX: [phone]   CC: Colene Dauphin, MD [address] [place] [address] Phone: [phone]  Fax: [phone]    Return to Endocrinology clinic as below: Future Appointments  Date Time Provider Department Center  07/18/2023  2:00 PM Antwione Picotte, Julian Obey, MD [facility] None  10/05/2023  3:30 PM Mannam, Praveen, MD [facility] None  12/21/2023 11:00 AM [facility] None  01/16/2024  2:10 PM Jaffe, Adam R, DO [facility] None

## 2023-07-20 ENCOUNTER — Ambulatory Visit: Admitting: Neurology

## 2023-08-11 ENCOUNTER — Other Ambulatory Visit: Payer: Self-pay | Admitting: Internal Medicine

## 2023-08-15 NOTE — Telephone Encounter (Signed)
 Should not be due for refill until mid August 6 month supply done in Feb

## 2023-08-24 ENCOUNTER — Other Ambulatory Visit: Payer: Self-pay | Admitting: Internal Medicine

## 2023-09-01 ENCOUNTER — Ambulatory Visit: Payer: Self-pay

## 2023-09-01 NOTE — Telephone Encounter (Signed)
 Left message for patient to return call to clinic.  If she calls back please schedule an appointment for her if she is refusing ED.  She needs to physically be seen and evaluated.

## 2023-09-01 NOTE — Telephone Encounter (Signed)
 FYI Only or Action Required?: Action required by provider: update on patient condition.  Patient was last seen in primary care on 03/29/2023 by Geofm Glade PARAS, MD.  Called Nurse Triage reporting Fall.  Symptoms began Tuesday August 30, 2023 .  Interventions attempted: Rest, hydration, or home remedies.  Symptoms are: unchanged.  Triage Disposition: Go to ED Now (or PCP Triage)-patient would like a call from PCP with recommendation.   Patient/caregiver understands and will follow disposition?: No, refuses disposition  Copied from CRM [id number]. Topic: Clinical - Red Word Triage >> Sep 01, 2023  9:39 AM Gennette ORN wrote: Red Word that prompted transfer to Nurse Triage: Patient has fallen on Tuesday. She has been having trouble walking and she is also having headaches as well. Reason for Disposition  Patient sounds very sick or weak to the triager  Answer Assessment - Initial Assessment Questions 1. MECHANISM: How did the fall happen?     Patient reports that she woke up on Tuesday with tremors and reports falling while making coffee. Patient was unable to say if she loss consciousness.  2. DOMESTIC VIOLENCE AND ELDER ABUSE SCREENING: Did you fall because someone pushed you or tried to hurt you? If Yes, ask: Are you safe now?     no 3. ONSET: When did the fall happen? (e.g., minutes, hours, or days ago)     Occurred on Tuesday 4. LOCATION: What part of the body hit the ground? (e.g., back, buttocks, head, hips, knees, hands, head, stomach)     head 5. INJURY: Did you hurt (injure) yourself when you fell? If Yes, ask: What did you injure? Tell me more about this? (e.g., body area; type of injury; pain severity)     Knot on the back of head 6. PAIN: Is there any pain? If Yes, ask: How bad is the pain? (e.g., Scale 0-10; or none, mild,      Patient reports no pain 7. SIZE: For cuts, bruises, or swelling, ask: How large is it? (e.g., inches or centimeters)       9.  OTHER SYMPTOMS: Do you have any other symptoms? (e.g., dizziness, fever, weakness; new-onset or worsening).      tremors 10. CAUSE: What do you think caused the fall (or falling)? (e.g., dizzy spell, tripped)       Unsure if she had a dizzy spell or if tremors were involved.  Patient endorses tremors that come and go. Patient states she woke up on Tuesday with increased tremors. Patient states she was eventually able to get up out of bed and went to make coffee. Patient couldn't say for certain what happened but endorses that she fell because I have a knot to the back of my head. Questioned patient if she passed out and loss consciousness but patient is unsure. Aspirin  but no other blood thinners. Patient recommended to ED but patient refused. Please don't send me to the Emergency Department. They will keep me and not let me eat. Explained reasoning for being evaluated at the Emergency Department. Patient verbalized understanding but would like to get PCP recommendation. Please call patient back.  Protocols used: Falls and [facility]

## 2023-09-06 NOTE — Telephone Encounter (Signed)
 error

## 2023-09-07 ENCOUNTER — Ambulatory Visit: Payer: Self-pay

## 2023-09-07 NOTE — Telephone Encounter (Signed)
 FYI Only or Action Required?: FYI only for provider.  Patient was last seen in primary care on 03/29/2023 by Geofm Glade PARAS, MD.  Called Nurse Triage reporting Dizziness.  Symptoms began a week ago.  Interventions attempted: Rest, hydration, or home remedies.  Symptoms are: gradually worsening.  Triage Disposition: Go to ED Now (Notify PCP)  Patient/caregiver understands and will follow disposition?: No, refuses disposition FYI: Patient triage for the same event on 7/24 and was advised then my NT to go to ED. Pt refused at that time and requested PCP recommendation. Per communication note on 7/24 patient is to be scheduled for in-office visit if she continues to refuse ED.  Appt scheduled for 7/31 at 4pm. This RN advised patient to not drive as well. Pt verbalized understanding.   Copied from CRM [id number]. Topic: Clinical - Red Word Triage >> Sep 07, 2023 11:07 AM Chiquita SQUIBB wrote: Red Word that prompted transfer to Nurse Triage: Patient is calling in stating she has been lightheaded, dizzy and had a fall last Tuesday and hit her head. Patient has been dizzy since the fall. Reason for Disposition  [1] ACUTE NEURO SYMPTOM AND [2] now fine  (Definition: Difficult to awaken OR confused thinking and talking OR slurred speech OR weakness of arms or legs OR unsteady walking.)  Answer Assessment - Initial Assessment Questions 1. MECHANISM: How did the injury happen? For falls, ask: What height did you fall from? and What surface did you fall against?      Patient fell drom standing, had some off/on tremors last week on Tuesday and fell. Patient says she doesn't recall the actual fall but she came to when she banged her head  2. ONSET: When did the injury happen? (e.g., minutes, hours ago)      8 days  3. NEUROLOGIC SYMPTOMS: Was there any loss of consciousness? Are there any other neurological symptoms?      Presumed LOC. Pt reports dizziness and imbalance  4. MENTAL STATUS:  Does the person know who they are, who you are, and where they are?      Yes  5. LOCATION: What part of the head was hit?      Back of head  6. SCALP APPEARANCE: What does the scalp look like? Is it bleeding now? If Yes, ask: Is it difficult to stop?      Pt reports a knot on the back of her head that has since gone down  7. SIZE: For cuts, bruises, or swelling, ask: How large is it? (e.g., inches or centimeters)      No  8. PAIN: Is there any pain? If Yes, ask: How bad is it? (Scale 0-10; or none, mild, moderate, severe)     Denies pain at this time  9. TETANUS: For any breaks in the skin, ask: When was your last tetanus booster?     No breaks in skin  10. BLOOD THINNERS: Do you take any blood thinners? (e.g., aspirin , clopidogrel  / Plavix , coumadin, heparin ). Notes: Other strong blood thinners include: Arixtra (fondaparinux), Eliquis (apixaban), Pradaxa (dabigatran), and Xarelto  (rivaroxaban ).       Patient takes Aspirin  daily  11. OTHER SYMPTOMS: Do you have any other symptoms? (e.g., neck pain, vomiting)       Feels light headed  12. PREGNANCY: Is there any chance you are pregnant? When was your last menstrual period?       No  Protocols used: Head Injury-A-AH

## 2023-09-08 ENCOUNTER — Encounter: Payer: Self-pay | Admitting: Internal Medicine

## 2023-09-08 ENCOUNTER — Ambulatory Visit (INDEPENDENT_AMBULATORY_CARE_PROVIDER_SITE_OTHER): Admitting: Internal Medicine

## 2023-09-08 VITALS — BP 140/68 | Temp 98.7°F | Ht 62.0 in | Wt 179.4 lb

## 2023-09-08 DIAGNOSIS — I5032 Chronic diastolic (congestive) heart failure: Secondary | ICD-10-CM

## 2023-09-08 DIAGNOSIS — Z794 Long term (current) use of insulin: Secondary | ICD-10-CM | POA: Diagnosis not present

## 2023-09-08 DIAGNOSIS — E1059 Type 1 diabetes mellitus with other circulatory complications: Secondary | ICD-10-CM

## 2023-09-08 DIAGNOSIS — R55 Syncope and collapse: Secondary | ICD-10-CM

## 2023-09-08 DIAGNOSIS — J4489 Other specified chronic obstructive pulmonary disease: Secondary | ICD-10-CM | POA: Diagnosis not present

## 2023-09-08 DIAGNOSIS — S0003XA Contusion of scalp, initial encounter: Secondary | ICD-10-CM

## 2023-09-08 DIAGNOSIS — S0093XA Contusion of unspecified part of head, initial encounter: Secondary | ICD-10-CM | POA: Insufficient documentation

## 2023-09-08 NOTE — Assessment & Plan Note (Signed)
Volume stable, cont current med tx

## 2023-09-08 NOTE — Assessment & Plan Note (Signed)
 Post fall, mild tender, no laceration or bleeding, declines Head CT for now

## 2023-09-08 NOTE — Progress Notes (Signed)
 Patient ID: [name], female   DOB: 10-09-1940, 83 y.o.   MRN: [id number]        Chief Complaint: follow up syncope with fall, contusion right occiput, dm, diastolic chf, copd asthma       HPI:  [name] is a 83 y.o. female here with c/o episode of syncope 3 days ago, did not seek medical attention, Pt has hx of recurrent falls over last 10 yrs, and VT and SVT by recent cardiac monitor.  Pt got OOB tues AM and felt overall weak, staggery, off balance, and remembers holding on to the edge of counter in the kitchen, then apparently passed out without warning or pre - symptoms.  Found herself on her back on the floor with contusion tenderness to the right occiput area.  Was able to get about after that, did not seek attention.  No hx of seizure.  Pt denies chest pain, increased sob or doe, wheezing, orthopnea, PND, increased LE swelling, palpitations, dizziness or syncope.   Pt denies polydipsia, polyuria, or new focal neuro s/s.   Declines ED referral.         Wt Readings from Last 3 Encounters:  09/08/23 179 lb 6.4 oz (81.4 kg)  06/23/23 177 lb (80.3 kg)  05/03/23 172 lb 12.8 oz (78.4 kg)   BP Readings from Last 3 Encounters:  09/08/23 (!) 140/68  06/23/23 136/71  05/03/23 128/64         Past Medical History:  Diagnosis Date   Adenomatous colon polyp    Allergy     Anxiety    Arthritis    Asthma    Carotid artery disease (HCC)    carotid US  02/2017: bilat ICA 1-39%   Chronic diastolic CHF    Echo 02/2017: EF 65-70, Gr 2 DD, mild MS (mean 5), PASP 44   COPD    pt is unsure if has been officially diagnosed   Coronary artery disease    CABG '09- cathed 12/09, 9/10, 6/11, 3/14 and 12/13/16- medical Rx // cath 12/2016 - 2/3 grafts patent >> med Rx // Myoview  12/17: low risk   Diabetes mellitus    Dyspnea    with exertion   Gastroesophageal reflux disease    Headache    Hiatal hernia    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   Hyperlipidemia    Hypertension    Myocardial  infarction (HCC)    PONV (postoperative nausea and vomiting)    Schatzki's ring    Shoulder injury    resolved after shoulder surgery   Sleep apnea    not on cpap   Past Surgical History:  Procedure Laterality Date   ABDOMINAL HYSTERECTOMY     APPENDECTOMY     came out with Hysterectomy   CARDIAC CATHETERIZATION  07/23/2009   EF 60%   CARDIAC CATHETERIZATION  10/11/2008   CARDIAC CATHETERIZATION  03/01/2007   EF 75-80%   CARDIOVASCULAR STRESS TEST  11/15/2007   EF 60%   COLONOSCOPY     CORONARY ARTERY BYPASS GRAFT     SEVERELY DISEASED SAPHENOUS VEIN GRAFT TO THE RIGHT CORONARY ARTERY BUT WITH FAIRLY WELL PRESERVED FLOW TO THE DISTAL RIGHT CORONARY ARTERY FROM THE NATIVE CIRCULATION-RESTART  CATH IN JUNE 2000, REVEALS MILD/MODERATE  CAD WITH GOOD FLOW DOWN HER LAD   ESOPHAGOGASTRODUODENOSCOPY     EYE SURGERY     bilateral cataract surgery with lens implant   LEFT HEART CATH AND CORS/GRAFTS ANGIOGRAPHY N/A  12/14/2016   Procedure: LEFT HEART CATH AND CORS/GRAFTS ANGIOGRAPHY;  Surgeon: Dann Candyce RAMAN, MD;  Location: [facility] INVASIVE CV LAB;  Service: Cardiovascular;  Laterality: N/A;   lense removal Left    POLYPECTOMY     REVERSE SHOULDER ARTHROPLASTY Left 06/25/2022   Procedure: REVERSE SHOULDER ARTHROPLASTY;  Surgeon: Kay Kemps, MD;  Location: [facility] ORS;  Service: Orthopedics;  Laterality: Left;  general and choice with interscalene block   RIGHT/LEFT HEART CATH AND CORONARY/GRAFT ANGIOGRAPHY N/A 07/28/2021   Procedure: RIGHT/LEFT HEART CATH AND CORONARY/GRAFT ANGIOGRAPHY;  Surgeon: Burnard Debby LABOR, MD;  Location: [facility] INVASIVE CV LAB;  Service: Cardiovascular;  Laterality: N/A;   ROTATOR CUFF REPAIR     right and left   TONSILLECTOMY     age 63   TOTAL KNEE ARTHROPLASTY Right 02/20/2014   Procedure: RIGHT TOTAL KNEE ARTHROPLASTY;  Surgeon: Tanda LABOR Heading, MD;  Location: [facility];  Service: Orthopedics;  Laterality: Right;   tumor removed kidney     UPPER GASTROINTESTINAL ENDOSCOPY      US  ECHOCARDIOGRAPHY  03/08/2008   EF 55-60%    reports that she quit smoking about 47 years ago. Her smoking use included cigarettes. She started smoking about 68 years ago. She has a 10.5 pack-year smoking history. She has never used smokeless tobacco. She reports that she does not drink alcohol and does not use drugs. family history includes Diabetes in her maternal grandfather and mother; Emphysema in her paternal aunt and paternal uncle; Esophageal cancer (age of onset: 41) in her brother; Healthy in her child; Heart attack in her father; Heart disease in her maternal grandfather; Heart failure in her maternal grandfather; Rheum arthritis in her sister. Allergies  Allergen Reactions   Amlodipine Other (See Comments)    hallucinations    Banana Nausea And Vomiting    Stomach pumped   Co Q10 [Coenzyme Q10] Other (See Comments)    Body cramps   Codeine Other (See Comments)    Hallucinate, loose identity and don't know who I am   Insulin  Aspart (Human Analog) (Yeast) Other (See Comments)    Unknown   Lisinopril  Other (See Comments)    nose bleed   Metformin  And Related Nausea And Vomiting   Morphine Other (See Comments)    Can not function, it immobilizes me    Pentazocine Nausea And Vomiting   Pravastatin  Other (See Comments)    Hands locked up   Repatha [Evolocumab] Other (See Comments)    myalgias   Statins Other (See Comments)    Muscle cramps   Sulfa Antibiotics Swelling   Sulfonamide Derivatives Swelling   Tramadol  Hcl Other (See Comments)    Dizziness, cant function    Current Outpatient Medications on File Prior to Visit  Medication Sig Dispense Refill   acetaminophen  (TYLENOL ) 325 MG tablet Take 650 mg by mouth every 6 (six) hours as needed for moderate pain (pain score 4-6) or headache.     albuterol  (PROVENTIL  HFA;VENTOLIN  HFA) 108 (90 BASE) MCG/ACT inhaler Inhale 2 puffs into the lungs every 6 (six) hours as needed. For shortness of breath. 1 Inhaler 0   albuterol   (PROVENTIL ) (2.5 MG/3ML) 0.083% nebulizer solution Take 3 mLs (2.5 mg total) by nebulization every 6 (six) hours as needed for wheezing or shortness of breath. 360 mL 5   aspirin  EC 81 MG tablet Take 1 tablet (81 mg total) by mouth daily.     calcium  carbonate (TUMS - DOSED IN MG ELEMENTAL CALCIUM ) 500 MG chewable tablet  Chew 1 tablet by mouth daily as needed for indigestion or heartburn.     Cholecalciferol  (VITAMIN D ) 50 MCG (2000 UT) tablet Take 4,000 Units by mouth daily.     Continuous Glucose Sensor (FREESTYLE LIBRE 3 PLUS SENSOR) MISC 1 Device by Other route every 14 (fourteen) days. Change sensor every 15 days. 6 each 3   Cyanocobalamin  (VITAMIN B-12 IJ) Inject 1,000 mcg as directed every 30 (thirty) days.     diclofenac  Sodium (VOLTAREN ) 1 % GEL Apply topically.     diltiazem  (CARDIZEM  CD) 240 MG 24 hr capsule Take 1 capsule (240 mg total) by mouth daily. 90 capsule 2   divalproex  (DEPAKOTE  ER) 250 MG 24 hr tablet TAKE 1 TABLET (250 MG TOTAL) BY MOUTH AT BEDTIME. 90 tablet 1   fluticasone  (FLONASE ) 50 MCG/ACT nasal spray SPRAY 2 SPRAYS INTO EACH NOSTRIL EVERY DAY 48 mL 3   Fluticasone -Umeclidin-Vilant (TRELEGY ELLIPTA ) 200-62.5-25 MCG/ACT AEPB Inhale 1 puff into the lungs daily at 6 (six) AM. 60 each 5   furosemide  (LASIX ) 20 MG tablet Take 1 tablet (20 mg total) by mouth daily. Take 40 mg for the next 3 days then take 20 mg once a day. 93 tablet 3   Glucagon (BAQSIMI ONE PACK) 3 MG/DOSE POWD Administer 1 spray into affected nostril(s) as needed for hypoglycemic emergencies.     insulin  lispro protamine-lispro (HUMALOG  75/25 MIX) (75-25) 100 UNIT/ML SUSP injection Inject 0.06 mLs (6 Units total) into the skin daily with breakfast AND 0.08 mLs (8 Units total) daily with supper. 10 units at lunch depending on bs reading. 15 mL 3   Insulin  Syringe-Needle U-100 (INSULIN  SYRINGE .3CC/29GX1) 29G X 1 0.3 ML MISC 1 Device by Does not apply route in the morning and at bedtime. 200 each 3    isosorbide  mononitrate (IMDUR ) 60 MG 24 hr tablet Take one tablet ( 60 mg ) twice daily 8 hours apart. 180 tablet 3   meclizine  (ANTIVERT ) 25 MG tablet Take 25 mg by mouth 2 (two) times daily as needed for dizziness.     meloxicam (MOBIC) 15 MG tablet Take 15 mg by mouth daily.     metoprolol  succinate (TOPROL -XL) 100 MG 24 hr tablet Take 100 mg by mouth daily.     Multiple Vitamin (MULTIVITAMIN WITH MINERALS) TABS Take 1 tablet by mouth daily.     neomycin-polymyxin b -dexamethasone  (MAXITROL) 3.5-10000-0.1 SUSP      ondansetron  (ZOFRAN ) 4 MG tablet TAKE 1 TABLET BY MOUTH EVERY 8 HOURS AS NEEDED FOR NAUSEA AND VOMITING 20 tablet 1   ONETOUCH VERIO test strip USE AS INSTRUCTED TO TEST BLOOD SUGARS UP TO 4 TIMES DAILY 200 strip 12   pantoprazole  (PROTONIX ) 40 MG tablet Take 1 tablet (40 mg total) by mouth daily. 90 tablet 2   sertraline  (ZOLOFT ) 100 MG tablet TAKE 1 TABLET BY MOUTH EVERY DAY 90 tablet 2   spironolactone  (ALDACTONE ) 25 MG tablet Take 1 tablet (25 mg total) by mouth daily. 90 tablet 1   temazepam  (RESTORIL ) 30 MG capsule Take 1 tablet by mouth daily.     tiZANidine  (ZANAFLEX ) 2 MG tablet TAKE 1 TO 2 TABLETS (2-4 MG TOTAL) BY MOUTH AT BEDTIME AS NEEDED FOR MUSCLE SPASM 180 tablet 0   nitroGLYCERIN  (NITROSTAT ) 0.4 MG SL tablet Place 1 tablet (0.4 mg total) under the tongue every 5 (five) minutes as needed for chest pain. (Patient not taking: Reported on 09/08/2023) 25 tablet 3   No current facility-administered medications on file prior to  visit.        ROS:  All others reviewed and negative.  Objective        PE:  BP (!) 140/68   Temp 98.7 F (37.1 C)   Ht 5' 2 (1.575 m)   Wt 179 lb 6.4 oz (81.4 kg)   LMP  (LMP Unknown)   BMI 32.81 kg/m                 Constitutional: Pt appears in NAD               HENT: Head: NCAT.                Right Ear: External ear normal.                 Left Ear: External ear normal.                Eyes: . Pupils are equal, round, and reactive  to light. Conjunctivae and EOM are normal               Nose: without d/c or deformity               Neck: Neck supple. Gross normal ROM               Cardiovascular: Normal rate and regular rhythm.                 Pulmonary/Chest: Effort normal and breath sounds without rales or wheezing.                Abd:  Soft, NT, ND, + BS, no organomegaly               Neurological: Pt is alert. At baseline orientation, motor grossly intact               Skin: Skin is warm. No rashes, no other new lesions, LE edema - none               Psychiatric: Pt behavior is normal without agitation   Micro: none  Cardiac tracings I have personally interpreted today:  none  Pertinent Radiological findings (summarize): none   Lab Results  Component Value Date   WBC 6.1 02/16/2023   HGB 11.7 (L) 02/16/2023   HCT 35.9 (L) 02/16/2023   PLT 256 02/16/2023   GLUCOSE 153 (H) 02/28/2023   CHOL 108 10/21/2020   TRIG 125 10/21/2020   HDL 27 (L) 10/21/2020   LDLCALC 58 10/21/2020   ALT 11 09/10/2022   AST 15 09/10/2022   NA 140 02/28/2023   K 5.1 02/28/2023   CL 101 02/28/2023   CREATININE 1.01 (H) 02/28/2023   BUN 30 (H) 02/28/2023   CO2 26 02/28/2023   TSH 0.718 09/09/2022   INR 1.1 07/28/2021   HGBA1C 7.8 (A) 03/17/2023   MICROALBUR 2.0 10/30/2019   Assessment/Plan:  ZOIEE WIMMER is a 83 y.o. Black or African American [2] female with  has a past medical history of Adenomatous colon polyp, Allergy , Anxiety, Arthritis, Asthma, Carotid artery disease (HCC), Chronic diastolic CHF, COPD, Coronary artery disease, Diabetes mellitus, Dyspnea, Gastroesophageal reflux disease, Headache, Hiatal hernia, 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 elevation MI (2009), Hyperlipidemia, Hypertension, Myocardial infarction (HCC), PONV (postoperative nausea and vomiting), Schatzki's ring, Shoulder injury, and Sleep apnea.  Type 1 diabetes mellitus (HCC) Lab Results  Component Value Date   HGBA1C 7.8 (A) 03/17/2023   Mild uncontrolled, doubtful  that this is significant element with  recent episode,, pt to continue current medical treatment 75/25 SSI,    Syncope Etiology unclear but not likely vasovagal or seizure; declines ecg for now, had recent echo and cardiac monitor; ok for labs including cbc, pt for referral to new Cardiology as Dr Alveta has retired.    COPD with asthma Stable, cont inhaler prn  Chronic diastolic CHF (congestive heart failure) (HCC) Volume stable, cont current med tx  Head contusion Post fall, mild tender, no laceration or bleeding, declines Head CT for now  Followup: Return if symptoms worsen or fail to improve.  Lynwood Rush, MD 09/08/2023 8:21 PM [facility]

## 2023-09-08 NOTE — Assessment & Plan Note (Signed)
 Lab Results  Component Value Date   HGBA1C 7.8 (A) 03/17/2023   Mild uncontrolled, doubtful that this is significant element with recent episode,, pt to continue current medical treatment 75/25 SSI,

## 2023-09-08 NOTE — Assessment & Plan Note (Signed)
 Stable , cont inhaler prn

## 2023-09-08 NOTE — Patient Instructions (Addendum)
 Please continue all other medications as before  Please have the pharmacy call with any other refills you may need.  Please keep your appointments with your specialists as you may have planned  You will be contacted regarding the referral for: Cardiology  Please go to the LAB at the blood drawing area for the tests to be done - at the lab here in the AM as you requested  You will be contacted by phone if any changes need to be made immediately.  Otherwise, you will receive a letter about your results with an explanation, but please check with MyChart first.

## 2023-09-08 NOTE — Assessment & Plan Note (Signed)
 Etiology unclear but not likely vasovagal or seizure; declines ecg for now, had recent echo and cardiac monitor; ok for labs including cbc, pt for referral to new Cardiology as Dr Alveta has retired.

## 2023-09-09 ENCOUNTER — Ambulatory Visit: Payer: Self-pay | Admitting: Internal Medicine

## 2023-09-09 LAB — BASIC METABOLIC PANEL WITH GFR
BUN: 20 mg/dL (ref 6–23)
CO2: 31 meq/L (ref 19–32)
Calcium: 9.3 mg/dL (ref 8.4–10.5)
Chloride: 102 meq/L (ref 96–112)
Creatinine, Ser: 0.78 mg/dL (ref 0.40–1.20)
GFR: 69.49 mL/min (ref >60.00)
Glucose, Bld: 194 mg/dL — ABNORMAL HIGH (ref 70–99)
Potassium: 4.4 meq/L (ref 3.5–5.1)
Sodium: 140 meq/L (ref 135–145)

## 2023-09-09 LAB — HEPATIC FUNCTION PANEL
ALT: 10 U/L (ref 0–35)
AST: 14 U/L (ref 0–37)
Albumin: 4.3 g/dL (ref 3.5–5.2)
Alkaline Phosphatase: 112 U/L (ref 39–117)
Bilirubin, Direct: 0.1 mg/dL (ref 0.0–0.3)
Total Bilirubin: 0.4 mg/dL (ref 0.2–1.2)
Total Protein: 7.4 g/dL (ref 6.0–8.3)

## 2023-09-09 LAB — CBC WITH DIFFERENTIAL/PLATELET
Basophils Absolute: 0.1 K/uL (ref 0.0–0.1)
Basophils Relative: 1.2 % (ref 0.0–3.0)
Eosinophils Absolute: 0.2 K/uL (ref 0.0–0.7)
Eosinophils Relative: 4 % (ref 0.0–5.0)
HCT: 38.1 % (ref 36.0–46.0)
Hemoglobin: 12.1 g/dL (ref 12.0–15.0)
Lymphocytes Relative: 35.5 % (ref 12.0–46.0)
Lymphs Abs: 1.8 K/uL (ref 0.7–4.0)
MCHC: 31.7 g/dL (ref 30.0–36.0)
MCV: 83.8 fl (ref 78.0–100.0)
Monocytes Absolute: 0.4 K/uL (ref 0.1–1.0)
Monocytes Relative: 8.7 % (ref 3.0–12.0)
Neutro Abs: 2.6 K/uL (ref 1.4–7.7)
Neutrophils Relative %: 50.6 % (ref 43.0–77.0)
Platelets: 260 K/uL (ref 150.0–400.0)
RBC: 4.55 Mil/uL (ref 3.87–5.11)
RDW: 14.9 % (ref 11.5–15.5)
WBC: 5.2 K/uL (ref 4.0–10.5)

## 2023-09-09 LAB — HEMOGLOBIN A1C: Hgb A1c MFr Bld: 9 % — ABNORMAL HIGH (ref 4.6–6.5)

## 2023-09-09 LAB — TSH: TSH: 2.68 u[IU]/mL (ref 0.35–5.50)

## 2023-09-27 ENCOUNTER — Other Ambulatory Visit: Payer: Self-pay | Admitting: Internal Medicine

## 2023-09-27 DIAGNOSIS — E1059 Type 1 diabetes mellitus with other circulatory complications: Secondary | ICD-10-CM

## 2023-10-05 ENCOUNTER — Ambulatory Visit (INDEPENDENT_AMBULATORY_CARE_PROVIDER_SITE_OTHER): Admitting: Pulmonary Disease

## 2023-10-05 ENCOUNTER — Encounter: Payer: Self-pay | Admitting: Pulmonary Disease

## 2023-10-05 VITALS — BP 116/70 | HR 65 | Temp 97.9°F | Ht 62.0 in | Wt 179.0 lb

## 2023-10-05 DIAGNOSIS — J4489 Other specified chronic obstructive pulmonary disease: Secondary | ICD-10-CM | POA: Diagnosis not present

## 2023-10-05 MED ORDER — MONTELUKAST SODIUM 10 MG PO TABS: 10.0000 mg | ORAL_TABLET | Freq: Every day | ORAL | 11 refills | Status: AC

## 2023-10-05 NOTE — Progress Notes (Signed)
 "              [name]    [id number]    Jun 15, 1940  Primary Care Physician:Burns, Glade PARAS, MD  Referring Physician: Geofm Glade PARAS, MD [address] [place],  [place] [address]  Chief complaint: Follow-up for COPD, asthma, OSA  HPI: [name] has history of COPD, asthma overlap syndrome, GERD, OSA.  Previously followed by Dr. Noreen and Dr. Alaine Maintained on Advair , Spiriva .  Has daily symptoms of dyspnea.  Using albuterol  every day No nighttime awakenings.  Reports symptom of GERD.  Declined referral to GI Has history of sleep apnea. CPAP machine was discontinued as patient was noncompliant although she claims that she had been using it regularly.  Follow-up sleep studies in 2018 was nondiagnostic as she slept 20 4/2 now. Home sleep study in 2019 did not demonstrate OSA but there is nocturnal hypoxemia.  Patient was prescribed O2 at night but is not using it regularly More recently seen by neurology for headaches which is thought to be secondary to sleep apnea.  She has started using the oxygen  at night for the past week.  Interim history: Discussed the use of AI scribe software for clinical note transcription with the patient, who gave verbal consent to proceed.  History of Present Illness [name] is an 83 year old female with moderate COPD and severe persistent asthma who presents with worsening shortness of breath.  Dyspnea - Worsening shortness of breath with minimal exertion, such as walking from bedroom to kitchen - No improvement in symptoms despite use of Trelegy inhaler - Cardiac evaluation performed; cardiac etiology ruled out - Expresses concern about breathing, stating 'I'm afraid that I'm gonna lose my breath.'  Wheezing and asthma symptoms - Experiences wheezing attributed to asthma - No cough present - No sore throat  Chest discomfort - Chronic chest discomfort - No associated sore throat  Respiratory therapy and oxygen  use - Previously  used CPAP and nocturnal oxygen , both discontinued without explanation - Sleep study in 2023 did not show evidence of sleep apnea - Used oxygen  for almost two years, though informed she was not on oxygen  in the past  Medication use - Currently using Trelegy inhaler and Flonase  - Does not use chlorpheniramine  or Tessalon  for allergies or cough   Relevant pulmonary history Pets: No pets 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  No mold, hot tub, Jacuzzi, no down pillows or comforters. Smoking history: 10-pack-year smoker.  Quit around 1990 Travel history: No significant travel history Relevant family history: No significant family history of lung disease  Outpatient Encounter Medications as of 10/05/2023  Medication Sig   acetaminophen  (TYLENOL ) 325 MG tablet Take 650 mg by mouth every 6 (six) hours as needed for moderate pain (pain score 4-6) or headache.   albuterol  (PROVENTIL  HFA;VENTOLIN  HFA) 108 (90 BASE) MCG/ACT inhaler Inhale 2 puffs into the lungs every 6 (six) hours as needed. For shortness of breath.   albuterol  (PROVENTIL ) (2.5 MG/3ML) 0.083% nebulizer solution Take 3 mLs (2.5 mg total) by nebulization every 6 (six) hours as needed for wheezing or shortness of breath.   aspirin  EC 81 MG tablet Take 1 tablet (81 mg total) by mouth daily.   Blood Glucose Monitoring Suppl (ACCU-CHEK GUIDE) w/Device KIT 1 kit by Does not apply route as directed.   calcium  carbonate (TUMS - DOSED IN MG ELEMENTAL CALCIUM ) 500 MG chewable tablet Chew 1 tablet by mouth daily  as needed for indigestion or heartburn.   Cholecalciferol  (VITAMIN D ) 50 MCG (2000 UT) tablet Take 4,000 Units by mouth daily.   Continuous Glucose Sensor (FREESTYLE LIBRE 3 PLUS SENSOR) MISC 1 Device by Other route every 14 (fourteen) days. Change sensor every 15 days.   Cyanocobalamin  (VITAMIN B-12 IJ) Inject 1,000 mcg as directed every 30 (thirty) days.   diclofenac  Sodium (VOLTAREN ) 1  % GEL Apply topically.   diltiazem  (CARDIZEM  CD) 240 MG 24 hr capsule Take 1 capsule (240 mg total) by mouth daily.   divalproex  (DEPAKOTE  ER) 250 MG 24 hr tablet TAKE 1 TABLET (250 MG TOTAL) BY MOUTH AT BEDTIME.   fluticasone  (FLONASE ) 50 MCG/ACT nasal spray SPRAY 2 SPRAYS INTO EACH NOSTRIL EVERY DAY   Fluticasone -Umeclidin-Vilant (TRELEGY ELLIPTA ) 200-62.5-25 MCG/ACT AEPB Inhale 1 puff into the lungs daily at 6 (six) AM.   furosemide  (LASIX ) 20 MG tablet Take 1 tablet (20 mg total) by mouth daily. Take 40 mg for the next 3 days then take 20 mg once a day.   Glucagon (BAQSIMI ONE PACK) 3 MG/DOSE POWD Administer 1 spray into affected nostril(s) as needed for hypoglycemic emergencies.   insulin  lispro protamine-lispro (HUMALOG  75/25 MIX) (75-25) 100 UNIT/ML SUSP injection Inject 0.06 mLs (6 Units total) into the skin daily with breakfast AND 0.08 mLs (8 Units total) daily with supper. 10 units at lunch depending on bs reading.   Insulin  Syringe-Needle U-100 (INSULIN  SYRINGE .3CC/29GX1) 29G X 1 0.3 ML MISC 1 Device by Does not apply route in the morning and at bedtime.   isosorbide  mononitrate (IMDUR ) 60 MG 24 hr tablet Take one tablet ( 60 mg ) twice daily 8 hours apart.   meclizine  (ANTIVERT ) 25 MG tablet Take 25 mg by mouth 2 (two) times daily as needed for dizziness.   meloxicam (MOBIC) 15 MG tablet Take 15 mg by mouth daily.   metoprolol  succinate (TOPROL -XL) 100 MG 24 hr tablet Take 100 mg by mouth daily.   Multiple Vitamin (MULTIVITAMIN WITH MINERALS) TABS Take 1 tablet by mouth daily.   neomycin-polymyxin b -dexamethasone  (MAXITROL) 3.5-10000-0.1 SUSP    nitroGLYCERIN  (NITROSTAT ) 0.4 MG SL tablet Place 1 tablet (0.4 mg total) under the tongue every 5 (five) minutes as needed for chest pain. (Patient not taking: Reported on 09/08/2023)   ondansetron  (ZOFRAN ) 4 MG tablet TAKE 1 TABLET BY MOUTH EVERY 8 HOURS AS NEEDED FOR NAUSEA AND VOMITING   pantoprazole  (PROTONIX ) 40 MG tablet Take 1 tablet  (40 mg total) by mouth daily.   sertraline  (ZOLOFT ) 100 MG tablet TAKE 1 TABLET BY MOUTH EVERY DAY   spironolactone  (ALDACTONE ) 25 MG tablet Take 1 tablet (25 mg total) by mouth daily.   temazepam  (RESTORIL ) 30 MG capsule Take 1 tablet by mouth daily.   tiZANidine  (ZANAFLEX ) 2 MG tablet TAKE 1 TO 2 TABLETS (2-4 MG TOTAL) BY MOUTH AT BEDTIME AS NEEDED FOR MUSCLE SPASM   No facility-administered encounter medications on file as of 10/05/2023.   Vitals:   10/05/23 1515  BP: 116/70  Pulse: 65  Temp: 97.9 F (36.6 C)  Height: 5' 2 (1.575 m)  Weight: 179 lb (81.2 kg)  SpO2: 100%  TempSrc: Oral  BMI (Calculated): 32.73     Physical Exam GEN: No acute distress CV: Regular rate and rhythm no murmurs LUNGS: Clear to auscultation bilaterally normal respiratory effort SKIN JOINTS: Warm and dry no rash    Data Reviewed: Imaging: CT chest 12/20/2017- right lower lobe nodular opacity has resolved compatible with atelectasis.  Apical pulmonary nodule decreased in size.  Emphysema. 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  No acute pulmonary disease. Chest x-ray 03/08/2019- Mild interstitial markings with atelectasis. Chest x-ray 12/20/2021-lungs are clear with no acute cardiopulmonary process. Chest x-ray 02/15/2022-no active cardiopulmonary disease. I have reviewed the images personally.  PFTs: 06/04/15: FVC 1.57 L (77%) FEV1 1.01 L (64%) FEV1/FVC 0.65 FEF 25-75 0.54 L (39%) negative bronchodilator response 02/21/15: FVC 1.60 L (78%) FEV1 1.03 L (65%) FEV1/FVC 0.64 FEF 25-75 0.51 L (37%) positive bronchodilator response 11/20/14: FVC 1.44 L (70%) FEV1 0.91 L (57%) FEV1/FVC 0.63 FEF 25-75 0.47 L (33%) positive bronchodilator response TLC 3.92 L (79%) RV 103% DLCO uncorrected 64% 11/12/13: FVC 1.57 L (74%) FEV1 1.09 L (67%) FEV1/FVC 0.70 FEF 25-75 0.72 L (50%) negative bronchodilator response 08/04/11: FVC 1.61 L (60%) FEV1 0.89 L (48%) FEV1/FVC 0.56 FEF 25-75 0.29 L (14%) positive bronchodilator  response TLC 3.67 L (80%) RV 103% ERV 46% DLCO corrected 38%  Labs: CBC 10/20/2017-WBC 6, eos 2.6%, absolute eosinophil count 156 CBC 02/12/2019-WBC 7.1, eos 1.8%, absolute eosinophil count 128  Sleep Home sleep study 03/29/2017- AHI 1.8, desats to 78% PSG 10/07/2016- AHI 6.8.  Total sleep time 40.5 minutes. In lab sleep study 11/20/2018-mild sleep apnea AHI 13.3, low O2 sat of 81%. Home sleep study 11/27/2021-no significant sleep apnea with AHI at 3.4/hour and SPO2 low at 83% with average O2 saturations at 92%.   Cardiac: Echocardiogram 11/17/2021-LVEF greater than 75%, normal RV systolic function.  TR signal is inadequate for measuring PA pressure.  Assessment & Plan Asthma-COPD overlap syndrome She has moderate COPD and severe persistent asthma, contributing to her asthma-COPD overlap syndrome. Reports worsening shortness of breath, particularly when moving from her bedroom to the kitchen. Currently on Trelegy inhaler and Flonase . No evidence of sleep apnea from the 2023 sleep study. Not currently on oxygen  therapy. - Prescribe Ohtuvayre  nebulizer - Prescribe Singulair  - Refer to pulmonary rehab and exercise program  Chronic dyspnea Experiences chronic dyspnea, which has worsened recently. Underwent cardiac evaluation to rule out cardiac causes, which were negative. Removal of CPAP and oxygen  therapy by insurance has potentially contributed to symptoms. - Conduct walk test to reassess oxygen  needs  Nocturnal hypoxia Home sleep study reviewed with no significant sleep apnea.  She does have low O2 sat of 83% and was previously on nocturnal oxygen . Does not want to go back on O2  Plan/Recommendations: Continue Trelegy inhaler Steroid nasal spray, antihistamine, Tessalon  Protonix  twice daily  Ellora Varnum MD [facility] 10/05/2023, 3:16 PM  CC: Geofm Glade PARAS, MD "

## 2023-10-05 NOTE — Patient Instructions (Signed)
  VISIT SUMMARY: [name], an 83 year old female with moderate COPD and severe persistent asthma, visited today due to worsening shortness of breath. She reports increased difficulty breathing with minimal exertion, such as walking from her bedroom to the kitchen. Despite using her Trelegy inhaler, her symptoms have not improved. A cardiac evaluation ruled out heart-related causes for her symptoms. She also experiences wheezing and chronic chest discomfort. Previously, she used CPAP and nocturnal oxygen , but these were discontinued. A 2023 sleep study showed no evidence of sleep apnea. Currently, she uses Trelegy inhaler and Flonase .  YOUR PLAN: -ASTHMA-COPD OVERLAP SYNDROME: Asthma-COPD overlap syndrome is a condition where a person has both asthma and chronic obstructive pulmonary disease (COPD), leading to breathing difficulties. To help manage your symptoms, we are prescribing an Ohtuvayre  nebulizer and Singulair . Additionally, we are referring you to a pulmonary rehabilitation and exercise program to improve your lung function and overall health.  -CHRONIC DYSPNEA: Chronic dyspnea means experiencing long-term shortness of breath. Your recent worsening of symptoms may be related to the discontinuation of CPAP and oxygen  therapy. We will conduct a walk test to reassess your oxygen  needs and determine if you require supplemental oxygen  again.  INSTRUCTIONS: Please follow up with the pulmonary rehabilitation and exercise program as referred. Additionally, complete the walk test as scheduled to reassess your oxygen  needs. Continue using your Trelegy inhaler and Flonase  as prescribed. Start using the Ohtuvayre  nebulizer and Singulair  as directed.

## 2023-10-06 ENCOUNTER — Telehealth (HOSPITAL_COMMUNITY): Payer: Self-pay

## 2023-10-06 ENCOUNTER — Encounter (HOSPITAL_COMMUNITY): Payer: Self-pay

## 2023-10-06 LAB — CBC WITH DIFFERENTIAL/PLATELET
Basophils Absolute: 0.1 K/uL (ref 0.0–0.1)
Basophils Relative: 1.2 % (ref 0.0–3.0)
Eosinophils Absolute: 0.2 K/uL (ref 0.0–0.7)
Eosinophils Relative: 3.7 % (ref 0.0–5.0)
HCT: 39.6 % (ref 36.0–46.0)
Hemoglobin: 12.7 g/dL (ref 12.0–15.0)
Lymphocytes Relative: 35.9 % (ref 12.0–46.0)
Lymphs Abs: 2.3 K/uL (ref 0.7–4.0)
MCHC: 32.2 g/dL (ref 30.0–36.0)
MCV: 83.2 fl (ref 78.0–100.0)
Monocytes Absolute: 0.5 K/uL (ref 0.1–1.0)
Monocytes Relative: 8.1 % (ref 3.0–12.0)
Neutro Abs: 3.2 K/uL (ref 1.4–7.7)
Neutrophils Relative %: 51.1 % (ref 43.0–77.0)
Platelets: 316 K/uL (ref 150.0–400.0)
RBC: 4.76 Mil/uL (ref 3.87–5.11)
RDW: 15 % (ref 11.5–15.5)
WBC: 6.3 K/uL (ref 4.0–10.5)

## 2023-10-06 LAB — IGE: IgE (Immunoglobulin E), Serum: 18 kU/L (ref <=114)

## 2023-10-06 NOTE — Telephone Encounter (Signed)
 Attempted to call patient regarding pulmonary rehab- no answer, left message. Mailed letter.

## 2023-10-06 NOTE — Telephone Encounter (Signed)
 Pt insurance is active and benefits verified through [facility] Medicare. Co-pay $0, DED $200/$200 met, out of pocket $0/$0 met, co-insurance 0%. No pre-authorization required. 10/06/2023 @ 8:52am, spoke with Hadassah HERO., REF# [id number]. 36-72 lifetime sessions.

## 2023-10-12 ENCOUNTER — Telehealth: Payer: Self-pay

## 2023-10-12 NOTE — Telephone Encounter (Signed)
 Received Ohtuvayre  new start paperwork. Completed form and faxed with clinicals and insurance card copy to [facility]   Phone#: [phone] Fax#: [phone]

## 2023-10-13 ENCOUNTER — Ambulatory Visit: Payer: Self-pay | Admitting: Pulmonary Disease

## 2023-10-13 ENCOUNTER — Ambulatory Visit: Payer: Self-pay

## 2023-10-13 ENCOUNTER — Telehealth: Payer: Self-pay | Admitting: Internal Medicine

## 2023-10-13 NOTE — Telephone Encounter (Unsigned)
 Copied from CRM [id number]. Topic: Clinical - Medication Refill >> Oct 13, 2023 11:34 AM [place] H wrote: Medication: Zita 10 mg  Has the patient contacted their pharmacy? Yes, pharmacy states she needs needs new prescription been a while since filled, also not listed on medication list (Agent: If no, request that the patient contact the pharmacy for the refill. If patient does not wish to contact the pharmacy document the reason why and proceed with request.) (Agent: If yes, when and what did the pharmacy advise?)  This is the patient's preferred pharmacy:  [id number] [place], [place] - [address] AT [facility] [address] [place] [address] Phone: [phone] Fax: [phone]    Is this the correct pharmacy for this prescription? Yes If no, delete pharmacy and type the correct one.   Has the prescription been filled recently? No  Is the patient out of the medication? Yes  Has the patient been seen for an appointment in the last year OR does the patient have an upcoming appointment? Yes  Can we respond through MyChart? No  Agent: Please be advised that Rx refills may take up to 3 business days. We ask that you follow-up with your pharmacy.

## 2023-10-13 NOTE — Telephone Encounter (Signed)
 She is no longer on the medication.  She is currently only on 1 cholesterol medication.  Cardiology discontinued the other 1.  She can discuss with them at her next appointment.

## 2023-10-13 NOTE — Telephone Encounter (Signed)
 FYI Only or Action Required?: Action required by provider: pt declined appt [facility], NO transporation. Advised UC/ED. Call patient.  Patient was last seen in primary care on 09/08/2023 by Norleen Lynwood ORN, MD.  Called Nurse Triage reporting Urinary Frequency.  Symptoms began several days ago.  Interventions attempted: Nothing.  Symptoms are: gradually worsening.  Triage Disposition: See HCP Within 4 Hours (Or PCP Triage)  Patient/caregiver understands and will follow disposition?: Yes      Copied from CRM [id number]. Topic: Clinical - Red Word Triage >> Oct 13, 2023 11:39 AM [place] H wrote: [facility] that prompted transfer to Nurse Triage: Possible UTI, lower back discomfort burning Reason for Disposition  Diabetes mellitus or weak immune system (e.g., HIV positive, cancer chemo, splenectomy, organ transplant, chronic steroids)  Answer Assessment - Initial Assessment Questions No available appts in pcp clinic, offered appt [facility]. Pt declined appt and reports does not have any transportation, will have family to take to nearest UC after they get off work.  Advised UC/ED.  1. SEVERITY: How bad is the pain?  (e.g., Scale 1-10; mild, moderate, or severe)     6/10 2. FREQUENCY: How many times have you had painful urination today?      6x 3. PATTERN: Is pain present every time you urinate or just sometimes?      Every time  4. ONSET: When did the painful urination start?      Month ago, but burning and pain with urination started 2 weeks 5. FEVER: Do you have a fever? If Yes, ask: What is your temperature, how was it measured, and when did it start?     no 6. PAST UTI: Have you had a urine infection before? If Yes, ask: When was the last time? and What happened that time?      yes 7. CAUSE: What do you think is causing the painful urination?  (e.g., UTI, scratch, Herpes sore)     uti 8. OTHER SYMPTOMS: Do you have any other symptoms? (e.g., blood  in urine, flank pain, genital sores, urgency, vaginal discharge)     Lower back pain, lower abd pain, oragne urine Denies blood or foul smelling urine, n/v, chills  Protocols used: Urination Pain - Female-A-AH

## 2023-10-14 ENCOUNTER — Telehealth (HOSPITAL_COMMUNITY): Payer: Self-pay

## 2023-10-14 NOTE — Telephone Encounter (Signed)
 Message left for patient today.

## 2023-10-14 NOTE — Telephone Encounter (Signed)
 Pt insurance is active and benefits verified through [facility] Medicare Co-pay 0, DED $200/$200 met, out of pocket $2,200/$2,200 met, co-insurance 0%. no pre-authorization required, [employer] 10/14/2023@11 :48, REF# [id number]   36 visits per lifetime 0/36 used

## 2023-10-28 ENCOUNTER — Ambulatory Visit: Payer: Self-pay

## 2023-10-28 NOTE — Telephone Encounter (Signed)
 FYI Only or Action Required?: FYI only for provider.  Patient was last seen in primary care on 09/08/2023 by Norleen Lynwood ORN, MD.  Called Nurse Triage reporting Back Pain, Abdominal Pain, and Urinary Frequency.  Symptoms began 2 weeks ago.  Interventions attempted: OTC medications: Tylenol .  Symptoms are: gradually worsening.  Triage Disposition: See HCP Within 4 Hours (Or PCP Triage)  Patient/caregiver understands and will follow disposition?: Yes                             Copied from CRM [id number]. Topic: Clinical - Red Word Triage >> Oct 28, 2023 12:03 PM Pinkey ORN wrote: Red Word that prompted transfer to Nurse Triage: Pain In Right Side >> Oct 28, 2023 12:04 PM Pinkey ORN wrote: Patient also mentioned her urine is a gold color.  Reason for Disposition  Side (flank) or lower back pain present  Answer Assessment - Initial Assessment Questions This RN advised patient to be evaluated within 4 hours. No availability in office or other offices in the region today. Advised UC. Patient stated she would go to UC today, but would not stay if the wait time was too long.    1. SYMPTOM: What's the main symptom you're concerned about? (e.g., frequency, incontinence)     Urinary frequency 2. ONSET: When did the urinary symptoms start?     2 weeks ago 3. PAIN: Is there any pain? If Yes, ask: How bad is it? (Scale: 1-10; mild, moderate, severe)     Painful/burning urination, rates back and abdominal pain an 8 4. CAUSE: What do you think is causing the symptoms?     Unsure, possibly something related to kidneys 5. OTHER SYMPTOMS: Do you have any other symptoms? (e.g., blood in urine, fever, flank pain, pain with urination)     Right side back pain, right side abdominal pain, denies fever, denies hematuria, denies urinary retention  Protocols used: Urinary Symptoms-A-AH

## 2023-10-30 ENCOUNTER — Other Ambulatory Visit: Payer: Self-pay | Admitting: Internal Medicine

## 2023-10-31 NOTE — H&P (View-Only) (Signed)
       Cardiology Office Note Date:  11/04/2023  ID:  [name], DOB 05-10-1940, MRN [id number] PCP:  [name] Glade PARAS, MD  Cardiologist: [name] VEAR [name], MD  Chief Complaint  Patient presents with   Shortness of Breath    Problems Dyspnea CAD s/p CABG x 3 (LIMA-LAD, SVG-RCA, SVG-Diag) 2009 LHC 07/2021- prox LAD 60%, prox RCA 25%, LIMA/SVG to D1 patent, SVG-PDA occluded TTE 04/2023- 60-65%, mild LVH, GI1DD, severe MAC Event Monitor 02/2023 for 7 days normal On ASA81, XL100, and Imdur  60 mg BID HFpEF on FE20 and SE25 daily ED for volume overload in 02/2023 OSA not on CPAP, severe COPD Statin intolerance Uncontrolled IDDM HTN/HLD  Visits  03/25: ordered TTE for dyspnea --> 60-65%, G1DD, severe MAC and referred to pulm who started neb and recommended pulm rehab; Per pulm, dyspnea likely due to severe COPD-asthma and not being on CPAP.  09/25: LHC/RHC, DVT US , LP, start ezetimibe , d/c diltiazem      History of Present Illness: [name] is a 83 y.o. female who presents for follow up.   She reports that over the last months, her symptoms have gotten worse. She has had about 6 episodes of CP similar to the one before CABG, including one episode at rest. She has used nitroglycerin  twice and as used full dose aspirin  4-5 times. She also reports worsening dyspnea over the past month with minimal exertion. About a month, she had RLE edema that resolved a couple of days later. Over the last 24 hrs, she has had headaches, 4-5 episodes of diarrhea, and chills.   ROS: Otherwise negative  Physical Exam VS:  BP 138/62   Pulse (!) 54   Ht 5' 2 (1.575 m)   Wt 160 lb (72.6 kg)   LMP  (LMP Unknown)   SpO2 95% Comment: R foot toe next big toe  BMI 29.26 kg/m  , BMI Body mass index is 29.26 kg/m. GEN: Well nourished, well developed, in no acute distress HEENT: normal Neck: no JVD, carotid bruits, or masses Cardiac: RRR; no murmurs, rubs, or gallops,no edema  Respiratory:  CTAB  bilaterally, normal work of breathing GI: soft, nontender, nondistended, + BS Extremities: No LE edema Skin: warm and dry, no rash Neuro:  Strength and sensation are intact  Recent Labs: Reviewed  ASSESSMENT AND PLAN [name] is a 83 y.o. female who presents for follow up.   #DOE #CAD s/p CABG #HFpEF #Severe COPD #DM #Statin intolerance - Given increasing angina over the last month including an episode at rest, will proceed to [facility] for unstable angina. She has also had significant issue of dyspnea that could be in setting of CAD vs. HFpEF vs. COPD. Euvolemic on exam. Will obtain RHC to assess filling pressures and evaluate for pulmonary HTN.  - Will obtain DVT US  given Hx of RLE edema a month prior to worsening of her symptoms - Check lipid panel and start ezetimibe  10 mg daily - D/c dilt given bradycardia and likely contributing to fatigue with concomittant metop XL - Regarding her current symptoms, I recommended hydration and if worsens, to present to ED - Plan to start ohtuvayre  nebulizer at end of month and pulmonary rehab - Follow up in 1 month    Signed, [name] VEAR [name], MD  11/04/2023 1:51 PM    [facility]

## 2023-10-31 NOTE — Progress Notes (Unsigned)
       Cardiology Office Note Date:  11/04/2023  ID:  [name], DOB 13-Jul-1940, MRN [id number] PCP:  Geofm Glade PARAS, MD  Cardiologist: Joelle VEAR [name], MD  Chief Complaint  Patient presents with   Shortness of Breath    Problems Dyspnea CAD s/p CABG x 3 (LIMA-LAD, SVG-RCA, SVG-Diag) 2009 LHC 07/2021- prox LAD 60%, prox RCA 25%, LIMA/SVG to D1 patent, SVG-PDA occluded TTE 04/2023- 60-65%, mild LVH, GI1DD, severe MAC Event Monitor 02/2023 for 7 days normal On ASA81, XL100, and Imdur  60 mg BID HFpEF on FE20 and SE25 daily ED for volume overload in 02/2023 OSA not on CPAP, severe COPD Statin intolerance Uncontrolled IDDM HTN/HLD  Visits  03/25: ordered TTE for dyspnea --> 60-65%, G1DD, severe MAC and referred to pulm who started neb and recommended pulm rehab; Per pulm, dyspnea likely due to severe COPD-asthma and not being on CPAP.  09/25: LHC/RHC, DVT US , LP, start ezetimibe , d/c diltiazem      History of Present Illness: [name] is a 83 y.o. female who presents for follow up.   She reports that over the last months, her symptoms have gotten worse. She has had about 6 episodes of CP similar to the one before CABG, including one episode at rest. She has used nitroglycerin  twice and as used full dose aspirin  4-5 times. She also reports worsening dyspnea over the past month with minimal exertion. About a month, she had RLE edema that resolved a couple of days later. Over the last 24 hrs, she has had headaches, 4-5 episodes of diarrhea, and chills.   ROS: Otherwise negative  Physical Exam VS:  BP 138/62   Pulse (!) 54   Ht 5' 2 (1.575 m)   Wt 160 lb (72.6 kg)   LMP  (LMP Unknown)   SpO2 95% Comment: R foot toe next big toe  BMI 29.26 kg/m  , BMI Body mass index is 29.26 kg/m. GEN: Well nourished, well developed, in no acute distress HEENT: normal Neck: no JVD, carotid bruits, or masses Cardiac: RRR; no murmurs, rubs, or gallops,no edema  Respiratory:  CTAB  bilaterally, normal work of breathing GI: soft, nontender, nondistended, + BS Extremities: No LE edema Skin: warm and dry, no rash Neuro:  Strength and sensation are intact  Recent Labs: Reviewed  ASSESSMENT AND PLAN [name] is a 83 y.o. female who presents for follow up.   #DOE #CAD s/p CABG #HFpEF #Severe COPD #DM #Statin intolerance - Given increasing angina over the last month including an episode at rest, will proceed to [facility] for unstable angina. She has also had significant issue of dyspnea that could be in setting of CAD vs. HFpEF vs. COPD. Euvolemic on exam. Will obtain RHC to assess filling pressures and evaluate for pulmonary HTN.  - Will obtain DVT US  given Hx of RLE edema a month prior to worsening of her symptoms - Check lipid panel and start ezetimibe  10 mg daily - D/c dilt given bradycardia and likely contributing to fatigue with concomittant metop XL - Regarding her current symptoms, I recommended hydration and if worsens, to present to ED - Plan to start ohtuvayre  nebulizer at end of month and pulmonary rehab - Follow up in 1 month    Signed, Joelle VEAR [name], MD  11/04/2023 1:51 PM     [facility]

## 2023-11-03 NOTE — Telephone Encounter (Signed)
 Called [employer] for update on application status. They stated they never received the patient's enrollment form despite us  getting a fax confirmation. Refaxed all documents to [employer].

## 2023-11-04 ENCOUNTER — Ambulatory Visit

## 2023-11-04 VITALS — BP 138/62 | HR 54 | Ht 62.0 in | Wt 160.0 lb

## 2023-11-04 DIAGNOSIS — I10 Essential (primary) hypertension: Secondary | ICD-10-CM

## 2023-11-04 DIAGNOSIS — Z789 Other specified health status: Secondary | ICD-10-CM

## 2023-11-04 DIAGNOSIS — E7800 Pure hypercholesterolemia, unspecified: Secondary | ICD-10-CM

## 2023-11-04 DIAGNOSIS — R0602 Shortness of breath: Secondary | ICD-10-CM

## 2023-11-04 DIAGNOSIS — I5032 Chronic diastolic (congestive) heart failure: Secondary | ICD-10-CM

## 2023-11-04 DIAGNOSIS — J4489 Other specified chronic obstructive pulmonary disease: Secondary | ICD-10-CM

## 2023-11-04 DIAGNOSIS — Z01812 Encounter for preprocedural laboratory examination: Secondary | ICD-10-CM

## 2023-11-04 DIAGNOSIS — Z79899 Other long term (current) drug therapy: Secondary | ICD-10-CM

## 2023-11-04 DIAGNOSIS — Z951 Presence of aortocoronary bypass graft: Secondary | ICD-10-CM

## 2023-11-04 DIAGNOSIS — E669 Obesity, unspecified: Secondary | ICD-10-CM

## 2023-11-04 DIAGNOSIS — E119 Type 2 diabetes mellitus without complications: Secondary | ICD-10-CM

## 2023-11-04 LAB — LIPID PANEL

## 2023-11-04 MED ORDER — EZETIMIBE 10 MG PO TABS: 10.0000 mg | ORAL_TABLET | Freq: Every day | ORAL | 3 refills | Status: AC

## 2023-11-04 NOTE — Addendum Note (Signed)
 Addended by: JANIT GENI CROME on: 11/04/2023 02:51 PM   Modules accepted: Orders

## 2023-11-04 NOTE — Telephone Encounter (Signed)
 Received fax from [employer] confirming receipt of enrollment form.  Patient ID: [id number]

## 2023-11-04 NOTE — Patient Instructions (Addendum)
 Medication Instructions:  Please STOP taking diltiazem .   Please START taking zetia  10 mg daily.   *If you need a refill on your cardiac medications before your next appointment, please call your pharmacy*  Lab Work: Please complete a fasting lipid panel, a BMET and a BNP today in our first floor lab before you leave.  If you have labs (blood work) drawn today and your tests are completely normal, you will receive your results only by: MyChart Message (if you have MyChart) OR A paper copy in the mail If you have any lab test that is abnormal or we need to change your treatment, we will call you to review the results.  Testing/Procedures: Your physician has requested that you have a lower extremity venous duplex. This test is an ultrasound of the veins in the legs or arms. It looks at venous blood flow that carries blood from the heart to the legs or arms. Allow one hour for a Lower Venous exam. Allow thirty minutes for an Upper Venous exam. There are no restrictions or special instructions.  Please note: We ask at that you not bring children with you during ultrasound (echo/ vascular) testing. Due to room size and safety concerns, children are not allowed in the ultrasound rooms during exams. Our front office staff cannot provide observation of children in our lobby area while testing is being conducted. An adult accompanying a patient to their appointment will only be allowed in the ultrasound room at the discretion of the ultrasound technician under special circumstances. We apologize for any inconvenience.        Cardiac/Peripheral Catheterization   You are scheduled for a Cardiac Catheterization on Friday, October 3 with Dr. Ozell Fell.  1. Please arrive at the [facility] (Main Entrance A) at [facility]: [address] [place], [place] [address] at 5:30 AM (This time is 2  hour(s) before your procedure to ensure your preparation).   Free valet parking service is  available. You will check in at ADMITTING. The support person will be asked to wait in the waiting room.  It is OK to have someone drop you off and come back when you are ready to be discharged.        Special note: Every effort is made to have your procedure done on time. Please understand that emergencies sometimes delay scheduled procedures.  2. Diet: Nothing to eat after midnight.  3. Hydration:You need to be well hydrated before your procedure. On October 3, you may drink approved liquids (see below) until 2 hours before the procedure, with 16 oz of water  as your last intake.   List of approved liquids water , clear juice, clear tea, black coffee, fruit juices, non-citric and without pulp, carbonated beverages, Gatorade, Kool -Aid, plain Jello-O and plain ice popsicles.  4. Labs: You will need to have blood drawn on Friday, September 26 at [facility] (1st Floor), [address], [place], [place] [address]. You do not need to be fasting.  5. Medication instructions in preparation for your procedure:   Contrast Allergy : No   Stop taking, Lasix  (Furosemide )  Friday, October 3,  Take only 4 units of insulin  the night before your procedure. Do not take any insulin  on the day of the procedure.   On the morning of your procedure, take Aspirin  81 mg and any morning medicines NOT listed above.  You may use sips of water .  6. Plan to go  home the same day, you will only stay overnight if medically necessary. 7. You MUST have a responsible adult to drive you home. 8. An adult MUST be with you the first 24 hours after you arrive home. 9. Bring a current list of your medications, and the last time and date medication taken. 10. Bring ID and current insurance cards. 11.Please wear clothes that are easy to get on and off and wear slip-on shoes.  Thank you for allowing us  to care for you!   -- [facility]    Follow-Up: At [facility], you and your health needs are our priority.  As part of our continuing mission to provide you with exceptional heart care, our providers are all part of one team.  This team includes your primary Cardiologist (physician) and Advanced Practice Providers or APPs (Physician Assistants and Nurse Practitioners) who all work together to provide you with the care you need, when you need it.  Your next appointment:   1 month(s)  Provider:   Dr. PHEBE Cedars, MD   We recommend signing up for the patient portal called MyChart.  Sign up information is provided on this After Visit Summary.  MyChart is used to connect with patients for Virtual Visits (Telemedicine).  Patients are able to view lab/test results, encounter notes, upcoming appointments, etc.  Non-urgent messages can be sent to your provider as well.   To learn more about what you can do with MyChart, go to [email or web address].

## 2023-11-05 LAB — CBC WITH DIFFERENTIAL/PLATELET
Basophils Absolute: 0.1 x10E3/uL (ref 0.0–0.2)
Basos: 1 %
EOS (ABSOLUTE): 0.1 x10E3/uL (ref 0.0–0.4)
Eos: 1 %
Hematocrit: 39.8 % (ref 34.0–46.6)
Hemoglobin: 12.2 g/dL (ref 11.1–15.9)
Immature Grans (Abs): 0 x10E3/uL (ref 0.0–0.1)
Immature Granulocytes: 0 %
Lymphocytes Absolute: 2.4 x10E3/uL (ref 0.7–3.1)
Lymphs: 34 %
MCH: 26.4 pg — ABNORMAL LOW (ref 26.6–33.0)
MCHC: 30.7 g/dL — ABNORMAL LOW (ref 31.5–35.7)
MCV: 86 fL (ref 79–97)
Monocytes Absolute: 0.5 x10E3/uL (ref 0.1–0.9)
Monocytes: 7 %
Neutrophils Absolute: 4 x10E3/uL (ref 1.4–7.0)
Neutrophils: 57 %
Platelets: 325 x10E3/uL (ref 150–450)
RBC: 4.62 x10E6/uL (ref 3.77–5.28)
RDW: 13.5 % (ref 11.7–15.4)
WBC: 7 x10E3/uL (ref 3.4–10.8)

## 2023-11-05 LAB — BASIC METABOLIC PANEL WITH GFR
BUN/Creatinine Ratio: 25 (ref 12–28)
BUN: 20 mg/dL (ref 8–27)
CO2: 21 mmol/L (ref 20–29)
Calcium: 9.9 mg/dL (ref 8.7–10.3)
Chloride: 100 mmol/L (ref 96–106)
Creatinine, Ser: 0.81 mg/dL (ref 0.57–1.00)
Glucose: 189 mg/dL — ABNORMAL HIGH (ref 70–99)
Potassium: 4.8 mmol/L (ref 3.5–5.2)
Sodium: 138 mmol/L (ref 134–144)
eGFR: 71 mL/min/1.73 (ref >59)

## 2023-11-05 LAB — LIPID PANEL
Chol/HDL Ratio: 6.5 ratio — ABNORMAL HIGH (ref 0.0–4.4)
Cholesterol, Total: 248 mg/dL — ABNORMAL HIGH (ref 100–199)
HDL: 38 mg/dL — ABNORMAL LOW (ref >39)
LDL Chol Calc (NIH): 182 mg/dL — ABNORMAL HIGH (ref 0–99)
Triglycerides: 149 mg/dL (ref 0–149)
VLDL Cholesterol Cal: 28 mg/dL (ref 5–40)

## 2023-11-05 LAB — PRO B NATRIURETIC PEPTIDE: NT-Pro BNP: 1190 pg/mL — ABNORMAL HIGH (ref 0–738)

## 2023-11-06 ENCOUNTER — Ambulatory Visit: Payer: Self-pay

## 2023-11-07 ENCOUNTER — Encounter (HOSPITAL_COMMUNITY): Payer: Self-pay

## 2023-11-07 ENCOUNTER — Encounter (HOSPITAL_COMMUNITY)
Admission: RE | Admit: 2023-11-07 | Discharge: 2023-11-07 | Disposition: A | Discharge status: Home / Self Care | Source: Ambulatory Visit | Attending: Pulmonary Disease | Admitting: Pulmonary Disease

## 2023-11-07 VITALS — BP 132/64 | HR 53 | Ht 62.0 in | Wt 182.1 lb

## 2023-11-07 DIAGNOSIS — J449 Chronic obstructive pulmonary disease, unspecified: Secondary | ICD-10-CM | POA: Insufficient documentation

## 2023-11-07 NOTE — Progress Notes (Signed)
  Pulmonary Rehab Orientation Note  Patient Details  Name: [name] MRN: [id number] Date of Birth: 07/05/1940 Referring Provider:  Dr. Praveen Mannam   This patient who was referred to Pulmonary Rehab by Dr. Theophilus with the diagnosis of COPD II arrived today in Cardiac and Pulmonary Rehab. She  arrived ambulatory with normal gait. She  does not carry portable oxygen . Per patient, [name] uses oxygen  never. Color good, skin warm and dry. Patient is oriented to time and place. Patient's medical history, psychosocial health, and medications reviewed. Psychosocial assessment reveals patient lives with alone. [name] is currently retired. Patient hobbies include watching tv and spending time with others. Patient reports her stress level is low. Areas of stress/anxiety include freezer stopped working recently. Patient does not exhibit signs of depression. Signs of depression include hopelessness and fatigue. PHQ2/9 score 1/3. [name] shows good  coping skills with positive outlook on life. Offered emotional support and reassurance. Will continue to monitor and evaluate progress toward psychosocial goal(s) of decreased stress. Physical assessment reveals heart rate is bradycardic, breath sounds diminished to auscultation, no wheezes, rales, or rhonchi. Grip strength equal, strong. Distal pulses present. [name] reports she does take medications as prescribed. Patient states she follows a regular  diet. The patient reports no specific efforts to gain or lose weight.. Pt's weight will be monitored closely. Demonstration and practice of PLB using pulse oximeter. [name] able to return demonstration satisfactorily. Safety and hand hygiene in the exercise area reviewed with patient. [name] voices understanding of the information reviewed. Department expectations discussed with patient and achievable goals were set. The patient shows enthusiasm about attending the program and we look forward to working with [name]. [name] completed a 6 min walk  test today and is scheduled to begin exercise on 10/6 at 1315.   1300-1430 [name]

## 2023-11-07 NOTE — Progress Notes (Signed)
 Pulmonary Individual Treatment Plan  Patient Details  Name: [name] MRN: [id number] Date of Birth: 31-Jul-1940 Referring Provider:   Conrad Ports Pulmonary Rehab Walk Test from 11/07/2023 in [facility]  Referring Provider Dr. Lonna Coder    Initial Encounter Date:  Flowsheet Row Pulmonary Rehab Walk Test from 11/07/2023 in [facility] for Heart, Vascular, [facility] Lung [facility]  Date 11/07/23    Visit Diagnosis: Stage 2 moderate COPD by GOLD classification (HCC)  Patient's Home Medications on Admission:   Current Outpatient Medications:    acetaminophen  (TYLENOL ) 325 MG tablet, Take 650 mg by mouth every 6 (six) hours as needed for moderate pain (pain score 4-6) or headache., Disp: , Rfl:    albuterol  (PROVENTIL  HFA;VENTOLIN  HFA) 108 (90 BASE) MCG/ACT inhaler, Inhale 2 puffs into the lungs every 6 (six) hours as needed. For shortness of breath., Disp: 1 Inhaler, Rfl: 0   albuterol  (PROVENTIL ) (2.5 MG/3ML) 0.083% nebulizer solution, Take 3 mLs (2.5 mg total) by nebulization every 6 (six) hours as needed for wheezing or shortness of breath., Disp: 360 mL, Rfl: 5   aspirin  EC 81 MG tablet, Take 1 tablet (81 mg total) by mouth daily., Disp: , Rfl:    Blood Glucose Monitoring Suppl (ACCU-CHEK GUIDE) w/Device KIT, 1 kit by Does not apply route as directed., Disp: 1 kit, Rfl: 0   calcium  carbonate (TUMS - DOSED IN MG ELEMENTAL CALCIUM ) 500 MG chewable tablet, Chew 1 tablet by mouth daily as needed for indigestion or heartburn., Disp: , Rfl:    Cholecalciferol  (VITAMIN D ) 50 MCG (2000 UT) tablet, Take 4,000 Units by mouth daily., Disp: , Rfl:    Cyanocobalamin  (VITAMIN B-12 IJ), Inject 1,000 mcg as directed every 30 (thirty) days., Disp: , Rfl:    diclofenac  Sodium (VOLTAREN ) 1 % GEL, Apply topically., Disp: , Rfl:    divalproex  (DEPAKOTE  ER) 250 MG 24 hr tablet, TAKE 1 TABLET (250 MG TOTAL) BY MOUTH AT BEDTIME.,  Disp: 90 tablet, Rfl: 1   ezetimibe  (ZETIA ) 10 MG tablet, Take 1 tablet (10 mg total) by mouth daily., Disp: 90 tablet, Rfl: 3   fluticasone  (FLONASE ) 50 MCG/ACT nasal spray, SPRAY 2 SPRAYS INTO EACH NOSTRIL EVERY DAY, Disp: 48 mL, Rfl: 3   Fluticasone -Umeclidin-Vilant (TRELEGY ELLIPTA ) 200-62.5-25 MCG/ACT AEPB, Inhale 1 puff into the lungs daily at 6 (six) AM., Disp: 60 each, Rfl: 5   furosemide  (LASIX ) 20 MG tablet, Take 1 tablet (20 mg total) by mouth daily. Take 40 mg for the next 3 days then take 20 mg once a day., Disp: 93 tablet, Rfl: 3   insulin  lispro protamine-lispro (HUMALOG  75/25 MIX) (75-25) 100 UNIT/ML SUSP injection, Inject 0.06 mLs (6 Units total) into the skin daily with breakfast AND 0.08 mLs (8 Units total) daily with supper. 10 units at lunch depending on bs reading., Disp: 15 mL, Rfl: 3   Insulin  Syringe-Needle U-100 (INSULIN  SYRINGE .3CC/29GX1) 29G X 1 0.3 ML MISC, 1 Device by Does not apply route in the morning and at bedtime., Disp: 200 each, Rfl: 3   isosorbide  mononitrate (IMDUR ) 60 MG 24 hr tablet, Take one tablet ( 60 mg ) twice daily 8 hours apart., Disp: 180 tablet, Rfl: 3   meclizine  (ANTIVERT ) 25 MG tablet, Take 25 mg by mouth 2 (two) times daily as needed for dizziness., Disp: , Rfl:    meloxicam (MOBIC) 15 MG tablet, Take 15 mg by mouth daily., Disp: ,  Rfl:    metoprolol  succinate (TOPROL -XL) 100 MG 24 hr tablet, Take 100 mg by mouth daily., Disp: , Rfl:    Multiple Vitamin (MULTIVITAMIN WITH MINERALS) TABS, Take 1 tablet by mouth daily., Disp: , Rfl:    nitroGLYCERIN  (NITROSTAT ) 0.4 MG SL tablet, Place 1 tablet (0.4 mg total) under the tongue every 5 (five) minutes as needed for chest pain., Disp: 25 tablet, Rfl: 3   ondansetron  (ZOFRAN ) 4 MG tablet, TAKE 1 TABLET BY MOUTH EVERY 8 HOURS AS NEEDED FOR NAUSEA AND VOMITING, Disp: 20 tablet, Rfl: 1   pantoprazole  (PROTONIX ) 40 MG tablet, TAKE 1 TABLET BY MOUTH EVERY DAY, Disp: 90 tablet, Rfl: 2   spironolactone   (ALDACTONE ) 25 MG tablet, TAKE 1 TABLET (25 MG TOTAL) BY MOUTH DAILY., Disp: 90 tablet, Rfl: 1   temazepam  (RESTORIL ) 30 MG capsule, Take 1 tablet by mouth daily., Disp: , Rfl:    Continuous Glucose Sensor (FREESTYLE LIBRE 3 PLUS SENSOR) MISC, 1 Device by Other route every 14 (fourteen) days. Change sensor every 15 days. (Patient not taking: Reported on 11/07/2023), Disp: 6 each, Rfl: 3   Glucagon (BAQSIMI ONE PACK) 3 MG/DOSE POWD, Administer 1 spray into affected nostril(s) as needed for hypoglycemic emergencies. (Patient not taking: Reported on 11/07/2023), Disp: , Rfl:    montelukast  (SINGULAIR ) 10 MG tablet, Take 1 tablet (10 mg total) by mouth at bedtime. (Patient not taking: Reported on 11/07/2023), Disp: 30 tablet, Rfl: 11   neomycin-polymyxin b -dexamethasone  (MAXITROL) 3.5-10000-0.1 SUSP, , Disp: , Rfl:    sertraline  (ZOLOFT ) 100 MG tablet, TAKE 1 TABLET BY MOUTH EVERY DAY (Patient not taking: Reported on 11/07/2023), Disp: 90 tablet, Rfl: 2   tiZANidine  (ZANAFLEX ) 2 MG tablet, TAKE 1 TO 2 TABLETS (2-4 MG TOTAL) BY MOUTH AT BEDTIME AS NEEDED FOR MUSCLE SPASM, Disp: 180 tablet, Rfl: 0  Past Medical History: Past Medical History:  Diagnosis Date   Adenomatous colon polyp    Allergy     Anxiety    Arthritis    Asthma    Carotid artery disease    carotid US  02/2017: bilat ICA 1-39%   Chronic diastolic CHF    Echo 02/2017: EF 65-70, Gr 2 DD, mild MS (mean 5), PASP 44   COPD    pt is unsure if has been officially diagnosed   Coronary artery disease    CABG '09- cathed 12/09, 9/10, 6/11, 3/14 and 12/13/16- medical Rx // cath 12/2016 - 2/3 grafts patent >> med Rx // Myoview  12/17: low risk   Diabetes mellitus    Dyspnea    with exertion   Gastroesophageal reflux disease    Headache    Hiatal hernia    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   Hyperlipidemia    Hypertension    Myocardial infarction (HCC)    PONV (postoperative nausea and vomiting)    Schatzki's ring    Shoulder  injury    resolved after shoulder surgery   Sleep apnea    not on cpap    Tobacco Use: Social History   Tobacco Use  Smoking Status Former   Current packs/day: 0.00   Average packs/day: 0.5 packs/day for 21.0 years (10.5 ttl pk-yrs)   Types: Cigarettes   Start date: 02/09/1955   Quit date: 02/09/1976   Years since quitting: 47.7  Smokeless Tobacco Never    Labs: Review Flowsheet  More data exists      Latest Ref Rng & Units 09/09/2022 10/05/2022 03/17/2023 09/09/2023 11/04/2023  Labs for ITP Cardiac and Pulmonary Rehab  Cholestrol 100 - 199 mg/dL - - - - 751   LDL (calc) 0 - 99 mg/dL - - - - 817   HDL-C >60 mg/dL - - - - 38   Trlycerides 0 - 149 mg/dL - - - - 850   Hemoglobin A1c 4.6 - 6.5 % - 7.3  7.8  9.0  -  TCO2 22 - 32 mmol/L 27  - - - -    Capillary Blood Glucose: Lab Results  Component Value Date   GLUCAP 162 (H) 09/10/2022   GLUCAP 123 (H) 09/10/2022   GLUCAP 151 (H) 09/10/2022   GLUCAP 167 (H) 09/10/2022   GLUCAP 150 (H) 09/09/2022     Pulmonary Assessment Scores:  Pulmonary Assessment Scores     Row Name 11/07/23 1345         ADL UCSD   ADL Phase Entry     SOB Score total 88       CAT Score   CAT Score 29       mMRC Score   mMRC Score 3       UCSD: Self-administered rating of dyspnea associated with activities of daily living (ADLs) 6-point scale (0 = not at all to 5 = maximal or unable to do because of breathlessness)  Scoring Scores range from 0 to 120.  Minimally important difference is 5 units  CAT: CAT can identify the health impairment of COPD patients and is better correlated with disease progression.  CAT has a scoring range of zero to 40. The CAT score is classified into four groups of low (less than 10), medium (10 - 20), high (21-30) and very high (31-40) based on the impact level of disease on health status. A CAT score over 10 suggests significant symptoms.  A worsening CAT score could be explained by an exacerbation, poor  medication adherence, poor inhaler technique, or progression of COPD or comorbid conditions.  CAT MCID is 2 points  mMRC: mMRC (Modified Medical Research Council) Dyspnea Scale is used to assess the degree of baseline functional disability in patients of respiratory disease due to dyspnea. No minimal important difference is established. A decrease in score of 1 point or greater is considered a positive change.   Pulmonary Function Assessment:  Pulmonary Function Assessment - 11/07/23 1523       Breath   Bilateral Breath Sounds Decreased    Shortness of Breath Yes;Fear of Shortness of Breath;Limiting activity          Exercise Target Goals: Exercise Program Goal: Individual exercise prescription set using results from initial 6 min walk test and THRR while considering  patient's activity barriers and safety.   Exercise Prescription Goal: Initial exercise prescription builds to 30-45 minutes a day of aerobic activity, 2-3 days per week.  Home exercise guidelines will be given to patient during program as part of exercise prescription that the participant will acknowledge.  Activity Barriers & Risk Stratification:  Activity Barriers & Cardiac Risk Stratification - 11/07/23 1343       Activity Barriers & Cardiac Risk Stratification   Activity Barriers Deconditioning;Muscular Weakness;Shortness of Breath;Balance Concerns;History of Falls;Other (comment)    Comments bilateral shoulder replacements          6 Minute Walk:  6 Minute Walk     Row Name 11/07/23 1517         6 Minute Walk   Phase Initial     Distance 1080 feet  Walk Time 6 minutes     # of Rest Breaks 2  1:52-2:25, 4:51-5:21     MPH 2.05     METS 1.5     RPE 15     Perceived Dyspnea  3     VO2 Peak 5.26     Resting HR 50 bpm     Resting BP 132/64     Resting Oxygen  Saturation  95 %     Exercise Oxygen  Saturation  during 6 min walk 86 %  O2 dropped to 86%, stopped pt and O2 recovered within 10 seconds      Max Ex. HR 92 bpm     Max Ex. BP 170/74     2 Minute Post BP 140/70       Interval HR   1 Minute HR 57     2 Minute HR 78     3 Minute HR 79     4 Minute HR 81     5 Minute HR 92     6 Minute HR 86     2 Minute Post HR 62     Interval Heart Rate? Yes       Interval Oxygen    Interval Oxygen ? Yes     Baseline Oxygen  Saturation % 95 %     1 Minute Oxygen  Saturation % 99 %     1 Minute Liters of Oxygen  0 L     2 Minute Oxygen  Saturation % 94 %     2 Minute Liters of Oxygen  0 L     3 Minute Oxygen  Saturation % 90 %     3 Minute Liters of Oxygen  0 L     4 Minute Oxygen  Saturation % 87 %  stopped pt and O2 recovered w/in 10 seconds     4 Minute Liters of Oxygen  0 L     5 Minute Oxygen  Saturation % 86 %  stopped pt and O2 recovered w/in 10 seconds     5 Minute Liters of Oxygen  0 L     6 Minute Oxygen  Saturation % 91 %     6 Minute Liters of Oxygen  0 L     2 Minute Post Oxygen  Saturation % 97 %     2 Minute Post Liters of Oxygen  0 L        Oxygen  Initial Assessment:  Oxygen  Initial Assessment - 11/07/23 1345       Home Oxygen    Home Oxygen  Device None    Sleep Oxygen  Prescription None    Home Exercise Oxygen  Prescription None    Home Resting Oxygen  Prescription None      Initial 6 min Walk   Oxygen  Used None      Program Oxygen  Prescription   Program Oxygen  Prescription None      Intervention   Short Term Goals To learn and exhibit compliance with exercise, home and travel O2 prescription;To learn and understand importance of maintaining oxygen  saturations>88%;To learn and demonstrate proper use of respiratory medications;To learn and demonstrate proper pursed lip breathing techniques or other breathing techniques. ;To learn and understand importance of monitoring SPO2 with pulse oximeter and demonstrate accurate use of the pulse oximeter.    Long  Term Goals Verbalizes importance of monitoring SPO2 with pulse oximeter and return demonstration;Exhibits proper breathing  techniques, such as pursed lip breathing or other method taught during program session;Demonstrates proper use of MDI's;Maintenance of O2 saturations>88%;Compliance with respiratory medication;Exhibits compliance with exercise, home  and travel O2  prescription          Oxygen  Re-Evaluation:   Oxygen  Discharge (Final Oxygen  Re-Evaluation):   Initial Exercise Prescription:  Initial Exercise Prescription - 11/07/23 1500       Date of Initial Exercise RX and Referring Provider   Date 11/07/23    Referring Provider Dr. Praveen Mannam    Expected Discharge Date 02/14/24      Recumbant Bike   Level 1    Minutes 15    METs 1.2      NuStep   Level 1    SPM 60    Minutes 15    METs 1.4      Prescription Details   Frequency (times per week) 2    Duration Progress to 30 minutes of continuous aerobic without signs/symptoms of physical distress      Intensity   THRR 40-80% of Max Heartrate 54-108    Ratings of Perceived Exertion 11-13    Perceived Dyspnea 0-4      Progression   Progression Continue to progress workloads to maintain intensity without signs/symptoms of physical distress.      Resistance Training   Training Prescription Yes    Weight red bands    Reps 10-15          Perform Capillary Blood Glucose checks as needed.  Exercise Prescription Changes:   Exercise Comments:   Exercise Goals and Review:   Exercise Goals     Row Name 11/07/23 1343             Exercise Goals   Increase Physical Activity Yes       Intervention Develop an individualized exercise prescription for aerobic and resistive training based on initial evaluation findings, risk stratification, comorbidities and participant's personal goals.;Provide advice, education, support and counseling about physical activity/exercise needs.       Expected Outcomes Short Term: Attend rehab on a regular basis to increase amount of physical activity.;Long Term: Exercising regularly at least 3-5  days a week.;Long Term: Add in home exercise to make exercise part of routine and to increase amount of physical activity.       Increase Strength and Stamina Yes       Intervention Provide advice, education, support and counseling about physical activity/exercise needs.;Develop an individualized exercise prescription for aerobic and resistive training based on initial evaluation findings, risk stratification, comorbidities and participant's personal goals.       Expected Outcomes Short Term: Increase workloads from initial exercise prescription for resistance, speed, and METs.;Short Term: Perform resistance training exercises routinely during rehab and add in resistance training at home;Long Term: Improve cardiorespiratory fitness, muscular endurance and strength as measured by increased METs and functional capacity ( )       Able to understand and use rate of perceived exertion (RPE) scale Yes       Intervention Provide education and explanation on how to use RPE scale       Expected Outcomes Short Term: Able to use RPE daily in rehab to express subjective intensity level;Long Term:  Able to use RPE to guide intensity level when exercising independently       Able to understand and use Dyspnea scale Yes       Intervention Provide education and explanation on how to use Dyspnea scale       Expected Outcomes Short Term: Able to use Dyspnea scale daily in rehab to express subjective sense of shortness of breath during exertion;Long Term: Able to use Dyspnea scale to  guide intensity level when exercising independently       Knowledge and understanding of Target Heart Rate Range (THRR) Yes       Intervention Provide education and explanation of THRR including how the numbers were predicted and where they are located for reference       Expected Outcomes Short Term: Able to state/look up THRR;Short Term: Able to use daily as guideline for intensity in rehab;Long Term: Able to use THRR to govern intensity  when exercising independently       Understanding of Exercise Prescription Yes       Intervention Provide education, explanation, and written materials on patient's individual exercise prescription       Expected Outcomes Short Term: Able to explain program exercise prescription;Long Term: Able to explain home exercise prescription to exercise independently          Exercise Goals Re-Evaluation :   Discharge Exercise Prescription (Final Exercise Prescription Changes):   Nutrition:  Target Goals: Understanding of nutrition guidelines, daily intake of sodium 1500mg , cholesterol 200mg , calories 30% from fat and 7% or less from saturated fats, daily to have 5 or more servings of fruits and vegetables.  Biometrics:  Pre Biometrics - 11/07/23 1522       Pre Biometrics   Grip Strength 13 kg           Nutrition Therapy Plan and Nutrition Goals:   Nutrition Assessments:  MEDIFICTS Score Key: >=70 Need to make dietary changes  40-70 Heart Healthy Diet <= 40 Therapeutic Level Cholesterol Diet   Picture Your Plate Scores: <59 Unhealthy dietary pattern with much room for improvement. 41-50 Dietary pattern unlikely to meet recommendations for good health and room for improvement. 51-60 More healthful dietary pattern, with some room for improvement.  >60 Healthy dietary pattern, although there may be some specific behaviors that could be improved.    Nutrition Goals Re-Evaluation:   Nutrition Goals Discharge (Final Nutrition Goals Re-Evaluation):   Psychosocial: Target Goals: Acknowledge presence or absence of significant depression and/or stress, maximize coping skills, provide positive support system. Participant is able to verbalize types and ability to use techniques and skills needed for reducing stress and depression.  Initial Review & Psychosocial Screening:  Initial Psych Review & Screening - 11/07/23 1346       Initial Review   Current issues with None  Identified      Family Dynamics   Good Support System? Yes      Barriers   Psychosocial barriers to participate in program There are no identifiable barriers or psychosocial needs.      Screening Interventions   Interventions Encouraged to exercise    Expected Outcomes Short Term goal: Utilizing psychosocial counselor, staff and physician to assist with identification of specific Stressors or current issues interfering with healing process. Setting desired goal for each stressor or current issue identified.;Long Term Goal: Stressors or current issues are controlled or eliminated.;Short Term goal: Identification and review with participant of any Quality of Life or Depression concerns found by scoring the questionnaire.;Long Term goal: The participant improves quality of Life and PHQ9 Scores as seen by post scores and/or verbalization of changes          Quality of Life Scores:  Scores of 19 and below usually indicate a poorer quality of life in these areas.  A difference of  2-3 points is a clinically meaningful difference.  A difference of 2-3 points in the total score of the Quality of Life Index has  been associated with significant improvement in overall quality of life, self-image, physical symptoms, and general health in studies assessing change in quality of life.  PHQ-9: Review Flowsheet  More data exists      11/07/2023 03/29/2023 10/19/2022 08/30/2022 08/25/2022  Depression screen PHQ 2/9  Decreased Interest 1 2 2 3 1   Down, Depressed, Hopeless 0 1 1 1 1   PHQ - 2 Score 1 3 3 4 2   Altered sleeping 1 2 3 2 1   Tired, decreased energy 1 2 3 3 1   Change in appetite 0 2 2 1  0  Feeling bad or failure about yourself  0 0 0 0 1  Trouble concentrating 0 1 1 1  0  Moving slowly or fidgety/restless 0 0 0 0 1  Suicidal thoughts 0 0 0 0 0  PHQ-9 Score 3 10 12 11 6   Difficult doing work/chores Somewhat difficult Somewhat difficult Somewhat difficult Somewhat difficult Somewhat difficult    Interpretation of Total Score  Total Score Depression Severity:  1-4 = Minimal depression, 5-9 = Mild depression, 10-14 = Moderate depression, 15-19 = Moderately severe depression, 20-27 = Severe depression   Psychosocial Evaluation and Intervention:  Psychosocial Evaluation - 11/07/23 1348       Psychosocial Evaluation & Interventions   Interventions Encouraged to exercise with the program and follow exercise prescription    Comments Pt denies any psy/soc barriers. Stated that recently her freezer stopped working and she is upset to have lost her frozen food. Stated she is working on getting it fixed. Pt stated she has great support from her daughters and eldest granddaughter. Denies any additional needs or resources at this time.    Expected Outcomes For Alexys to participate in PR free of any psy/soc barriers or conerns. To get her freezer fixed    Continue Psychosocial Services  No Follow up required          Psychosocial Re-Evaluation:   Psychosocial Discharge (Final Psychosocial Re-Evaluation):   Education: Education Goals: Education classes will be provided on a weekly basis, covering required topics. Participant will state understanding/return demonstration of topics presented.  Learning Barriers/Preferences:  Learning Barriers/Preferences - 11/07/23 1348       Learning Barriers/Preferences   Learning Barriers None    Learning Preferences None          Education Topics: Know Your Numbers Group instruction that is supported by a PowerPoint presentation. Instructor discusses importance of knowing and understanding resting, exercise, and post-exercise oxygen  saturation, heart rate, and blood pressure. Oxygen  saturation, heart rate, blood pressure, rating of perceived exertion, and dyspnea are reviewed along with a normal range for these values.    Exercise for the Pulmonary Patient Group instruction that is supported by a PowerPoint presentation. Instructor  discusses benefits of exercise, core components of exercise, frequency, duration, and intensity of an exercise routine, importance of utilizing pulse oximetry during exercise, safety while exercising, and options of places to exercise outside of rehab.    MET Level  Group instruction provided by PowerPoint, verbal discussion, and written material to support subject matter. Instructor reviews what METs are and how to increase METs.    Pulmonary Medications Verbally interactive group education provided by instructor with focus on inhaled medications and proper administration.   Anatomy and Physiology of the Respiratory System Group instruction provided by PowerPoint, verbal discussion, and written material to support subject matter. Instructor reviews respiratory cycle and anatomical components of the respiratory system and their functions. Instructor also reviews differences in obstructive  and restrictive respiratory diseases with examples of each.    Oxygen  Safety Group instruction provided by PowerPoint, verbal discussion, and written material to support subject matter. There is an overview of "What is Oxygen " and "Why do we need it".  Instructor also reviews how to create a safe environment for oxygen  use, the importance of using oxygen  as prescribed, and the risks of noncompliance. There is a brief discussion on traveling with oxygen  and resources the patient may utilize.   Oxygen  Use Group instruction provided by PowerPoint, verbal discussion, and written material to discuss how supplemental oxygen  is prescribed and different types of oxygen  supply systems. Resources for more information are provided.    Breathing Techniques Group instruction that is supported by demonstration and informational handouts. Instructor discusses the benefits of pursed lip and diaphragmatic breathing and detailed demonstration on how to perform both.     Risk Factor Reduction Group instruction that is  supported by a PowerPoint presentation. Instructor discusses the definition of a risk factor, different risk factors for pulmonary disease, and how the heart and lungs work together.   Pulmonary Diseases Group instruction provided by PowerPoint, verbal discussion, and written material to support subject matter. Instructor gives an overview of the different type of pulmonary diseases. There is also a discussion on risk factors and symptoms as well as ways to manage the diseases.   Stress and Energy Conservation Group instruction provided by PowerPoint, verbal discussion, and written material to support subject matter. Instructor gives an overview of stress and the impact it can have on the body. Instructor also reviews ways to reduce stress. There is also a discussion on energy conservation and ways to conserve energy throughout the day.   Warning Signs and Symptoms Group instruction provided by PowerPoint, verbal discussion, and written material to support subject matter. Instructor reviews warning signs and symptoms of stroke, heart attack, cold and flu. Instructor also reviews ways to prevent the spread of infection.   Other Education Group or individual verbal, written, or video instructions that support the educational goals of the pulmonary rehab program.    Knowledge Questionnaire Score:  Knowledge Questionnaire Score - 11/07/23 1458       Knowledge Questionnaire Score   Pre Score 17/18          Core Components/Risk Factors/Patient Goals at Admission:  Personal Goals and Risk Factors at Admission - 11/07/23 1525       Core Components/Risk Factors/Patient Goals on Admission   Improve shortness of breath with ADL's Yes    Intervention Provide education, individualized exercise plan and daily activity instruction to help decrease symptoms of SOB with activities of daily living.    Expected Outcomes Short Term: Improve cardiorespiratory fitness to achieve a reduction of  symptoms when performing ADLs;Long Term: Be able to perform more ADLs without symptoms or delay the onset of symptoms          Core Components/Risk Factors/Patient Goals Review:    Core Components/Risk Factors/Patient Goals at Discharge (Final Review):    ITP Comments:   Comments: Dr. Slater Staff is Medical Director for Pulmonary Rehab at [facility].

## 2023-11-07 NOTE — Progress Notes (Addendum)
 Initial Psychosocial Assessment  Patient Details  Name: [name] MRN: [id number] Date of Birth: [birth year] Referring Provider:  Dr. Praveen Mannam   Pt psychosocial assessment reveals pt lives alone. Pt is currently retired. Pt hobbies include watching tv and spending time with others. Pt reports her stress level is low. Areas of stress/anxiety include freezer recently stopped working.  Pt does not exhibit signs of depression. Signs of depression include hopelessness and fatigue. Pt shows good  coping skills with positive outlook . Offered emotional support and reassurance. Monitor and evaluate progress toward psychosocial goal(s).  Goal(s): Improved management of stress Improved coping skills Help patient work toward returning to meaningful activities that improve patient's QOL and are attainable with patient's lung disease   11/07/2023 2:53 PM

## 2023-11-08 ENCOUNTER — Telehealth (HOSPITAL_COMMUNITY): Payer: Self-pay

## 2023-11-08 NOTE — Telephone Encounter (Signed)
 Patient called out for her 1st 1:15 PR class on 10/07, states she has 2x dr appts that day. Patient will start her class on 10/09 instead.

## 2023-11-09 ENCOUNTER — Telehealth: Payer: Self-pay

## 2023-11-09 ENCOUNTER — Telehealth: Payer: Self-pay | Admitting: *Deleted

## 2023-11-09 NOTE — Telephone Encounter (Signed)
 Copied from CRM [id number]. Topic: Clinical - Prescription Issue >> Nov 07, 2023  3:37 PM Rozanna MATSU wrote: Reason for CRM: Ronal with [facility] called on behalf of the pt stated she never rec'd the Ohtuvayre  nebulizer and  Singulair . Stated she would like those sent to [id number] [address], [place] - [address] AT [facility] [address]  [address], [place] [id number]   Tried to reach out to patient VM/LM okay per DPR - Ohtuvayre  comes from a speciality pharmacy they will reach out to you about that and it looks like the singular / montelukast  was sent to pharmacy id ask them if its on file and to fill it for you. But it was stated on 11/07/23 that you do not take it.

## 2023-11-09 NOTE — Progress Notes (Addendum)
 Pulmonary Individual Treatment Plan  Patient Details  Name: [name] MRN: [id number] Date of Birth: 15-Mar-1940 Referring Provider:   Conrad Ports Pulmonary Rehab Walk Test from 11/07/2023 in [facility]  Referring Provider Dr. Lonna Coder    Initial Encounter Date:  Flowsheet Row Pulmonary Rehab Walk Test from 11/07/2023 in [facility]  Date 11/07/23    Visit Diagnosis: Stage 2 moderate COPD by GOLD classification (HCC)  Patient's Home Medications on Admission:   Current Outpatient Medications:    acetaminophen  (TYLENOL ) 325 MG tablet, Take 650 mg by mouth every 6 (six) hours as needed for moderate pain (pain score 4-6) or headache., Disp: , Rfl:    albuterol  (PROVENTIL  HFA;VENTOLIN  HFA) 108 (90 BASE) MCG/ACT inhaler, Inhale 2 puffs into the lungs every 6 (six) hours as needed. For shortness of breath., Disp: 1 Inhaler, Rfl: 0   albuterol  (PROVENTIL ) (2.5 MG/3ML) 0.083% nebulizer solution, Take 3 mLs (2.5 mg total) by nebulization every 6 (six) hours as needed for wheezing or shortness of breath., Disp: 360 mL, Rfl: 5   aspirin  EC 81 MG tablet, Take 1 tablet (81 mg total) by mouth daily., Disp: , Rfl:    Blood Glucose Monitoring Suppl (ACCU-CHEK GUIDE) w/Device KIT, 1 kit by Does not apply route as directed., Disp: 1 kit, Rfl: 0   calcium  carbonate (TUMS - DOSED IN MG ELEMENTAL CALCIUM ) 500 MG chewable tablet, Chew 1 tablet by mouth daily as needed for indigestion or heartburn., Disp: , Rfl:    Cholecalciferol  (VITAMIN D ) 50 MCG (2000 UT) tablet, Take 4,000 Units by mouth daily., Disp: , Rfl:    Cyanocobalamin  (VITAMIN B-12 IJ), Inject 1,000 mcg as directed every 30 (thirty) days., Disp: , Rfl:    diclofenac  Sodium (VOLTAREN ) 1 % GEL, Apply topically., Disp: , Rfl:    divalproex  (DEPAKOTE  ER) 250 MG 24 hr tablet, TAKE 1 TABLET (250 MG TOTAL) BY MOUTH AT BEDTIME.,  Disp: 90 tablet, Rfl: 1   ezetimibe  (ZETIA ) 10 MG tablet, Take 1 tablet (10 mg total) by mouth daily., Disp: 90 tablet, Rfl: 3   fluticasone  (FLONASE ) 50 MCG/ACT nasal spray, SPRAY 2 SPRAYS INTO EACH NOSTRIL EVERY DAY, Disp: 48 mL, Rfl: 3   Fluticasone -Umeclidin-Vilant (TRELEGY ELLIPTA ) 200-62.5-25 MCG/ACT AEPB, Inhale 1 puff into the lungs daily at 6 (six) AM., Disp: 60 each, Rfl: 5   furosemide  (LASIX ) 20 MG tablet, Take 1 tablet (20 mg total) by mouth daily. Take 40 mg for the next 3 days then take 20 mg once a day., Disp: 93 tablet, Rfl: 3   insulin  lispro protamine-lispro (HUMALOG  75/25 MIX) (75-25) 100 UNIT/ML SUSP injection, Inject 0.06 mLs (6 Units total) into the skin daily with breakfast AND 0.08 mLs (8 Units total) daily with supper. 10 units at lunch depending on bs reading., Disp: 15 mL, Rfl: 3   Insulin  Syringe-Needle U-100 (INSULIN  SYRINGE .3CC/29GX1) 29G X 1 0.3 ML MISC, 1 Device by Does not apply route in the morning and at bedtime., Disp: 200 each, Rfl: 3   isosorbide  mononitrate (IMDUR ) 60 MG 24 hr tablet, Take one tablet ( 60 mg ) twice daily 8 hours apart., Disp: 180 tablet, Rfl: 3   meclizine  (ANTIVERT ) 25 MG tablet, Take 25 mg by mouth 2 (two) times daily as needed for dizziness., Disp: , Rfl:    meloxicam (MOBIC) 15 MG tablet, Take 15 mg by mouth daily., Disp: ,  Rfl:    metoprolol  succinate (TOPROL -XL) 100 MG 24 hr tablet, Take 100 mg by mouth daily., Disp: , Rfl:    Multiple Vitamin (MULTIVITAMIN WITH MINERALS) TABS, Take 1 tablet by mouth daily., Disp: , Rfl:    nitroGLYCERIN  (NITROSTAT ) 0.4 MG SL tablet, Place 1 tablet (0.4 mg total) under the tongue every 5 (five) minutes as needed for chest pain., Disp: 25 tablet, Rfl: 3   ondansetron  (ZOFRAN ) 4 MG tablet, TAKE 1 TABLET BY MOUTH EVERY 8 HOURS AS NEEDED FOR NAUSEA AND VOMITING, Disp: 20 tablet, Rfl: 1   pantoprazole  (PROTONIX ) 40 MG tablet, TAKE 1 TABLET BY MOUTH EVERY DAY, Disp: 90 tablet, Rfl: 2   spironolactone   (ALDACTONE ) 25 MG tablet, TAKE 1 TABLET (25 MG TOTAL) BY MOUTH DAILY., Disp: 90 tablet, Rfl: 1   temazepam  (RESTORIL ) 30 MG capsule, Take 1 tablet by mouth daily., Disp: , Rfl:    Continuous Glucose Sensor (FREESTYLE LIBRE 3 PLUS SENSOR) MISC, 1 Device by Other route every 14 (fourteen) days. Change sensor every 15 days. (Patient not taking: Reported on 11/07/2023), Disp: 6 each, Rfl: 3   Glucagon (BAQSIMI ONE PACK) 3 MG/DOSE POWD, Administer 1 spray into affected nostril(s) as needed for hypoglycemic emergencies. (Patient not taking: Reported on 11/07/2023), Disp: , Rfl:    montelukast  (SINGULAIR ) 10 MG tablet, Take 1 tablet (10 mg total) by mouth at bedtime. (Patient not taking: Reported on 11/07/2023), Disp: 30 tablet, Rfl: 11   neomycin-polymyxin b -dexamethasone  (MAXITROL) 3.5-10000-0.1 SUSP, , Disp: , Rfl:    sertraline  (ZOLOFT ) 100 MG tablet, TAKE 1 TABLET BY MOUTH EVERY DAY (Patient not taking: Reported on 11/07/2023), Disp: 90 tablet, Rfl: 2   tiZANidine  (ZANAFLEX ) 2 MG tablet, TAKE 1 TO 2 TABLETS (2-4 MG TOTAL) BY MOUTH AT BEDTIME AS NEEDED FOR MUSCLE SPASM, Disp: 180 tablet, Rfl: 0  Past Medical History: Past Medical History:  Diagnosis Date   Adenomatous colon polyp    Allergy     Anxiety    Arthritis    Asthma    Carotid artery disease    carotid US  02/2017: bilat ICA 1-39%   Chronic diastolic CHF    Echo 02/2017: EF 65-70, Gr 2 DD, mild MS (mean 5), PASP 44   COPD    pt is unsure if has been officially diagnosed   Coronary artery disease    CABG '09- cathed 12/09, 9/10, 6/11, 3/14 and 12/13/16- medical Rx // cath 12/2016 - 2/3 grafts patent >> med Rx // Myoview  12/17: low risk   Diabetes mellitus    Dyspnea    with exertion   Gastroesophageal reflux disease    Headache    Hiatal hernia    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   Hyperlipidemia    Hypertension    Myocardial infarction (HCC)    PONV (postoperative nausea and vomiting)    Schatzki's ring    Shoulder  injury    resolved after shoulder surgery   Sleep apnea    not on cpap    Tobacco Use: Social History   Tobacco Use  Smoking Status Former   Current packs/day: 0.00   Average packs/day: 0.5 packs/day for 21.0 years (10.5 ttl pk-yrs)   Types: Cigarettes   Start date: 02/09/1955   Quit date: 02/09/1976   Years since quitting: 47.7  Smokeless Tobacco Never    Labs: Review Flowsheet  More data exists      Latest Ref Rng & Units 09/09/2022 10/05/2022 03/17/2023 09/09/2023 11/04/2023  Labs for ITP Cardiac and Pulmonary Rehab  Cholestrol 100 - 199 mg/dL - - - - 751   LDL (calc) 0 - 99 mg/dL - - - - 817   HDL-C >60 mg/dL - - - - 38   Trlycerides 0 - 149 mg/dL - - - - 850   Hemoglobin A1c 4.6 - 6.5 % - 7.3  7.8  9.0  -  TCO2 22 - 32 mmol/L 27  - - - -    Capillary Blood Glucose: Lab Results  Component Value Date   GLUCAP 162 (H) 09/10/2022   GLUCAP 123 (H) 09/10/2022   GLUCAP 151 (H) 09/10/2022   GLUCAP 167 (H) 09/10/2022   GLUCAP 150 (H) 09/09/2022     Pulmonary Assessment Scores:  Pulmonary Assessment Scores     Row Name 11/07/23 1345         ADL UCSD   ADL Phase Entry     SOB Score total 88       CAT Score   CAT Score 29       mMRC Score   mMRC Score 3       UCSD: Self-administered rating of dyspnea associated with activities of daily living (ADLs) 6-point scale (0 = not at all to 5 = maximal or unable to do because of breathlessness)  Scoring Scores range from 0 to 120.  Minimally important difference is 5 units  CAT: CAT can identify the health impairment of COPD patients and is better correlated with disease progression.  CAT has a scoring range of zero to 40. The CAT score is classified into four groups of low (less than 10), medium (10 - 20), high (21-30) and very high (31-40) based on the impact level of disease on health status. A CAT score over 10 suggests significant symptoms.  A worsening CAT score could be explained by an exacerbation, poor  medication adherence, poor inhaler technique, or progression of COPD or comorbid conditions.  CAT MCID is 2 points  mMRC: mMRC (Modified Medical Research Council) Dyspnea Scale is used to assess the degree of baseline functional disability in patients of respiratory disease due to dyspnea. No minimal important difference is established. A decrease in score of 1 point or greater is considered a positive change.   Pulmonary Function Assessment:  Pulmonary Function Assessment - 11/07/23 1523       Breath   Bilateral Breath Sounds Decreased    Shortness of Breath Yes;Fear of Shortness of Breath;Limiting activity          Exercise Target Goals: Exercise Program Goal: Individual exercise prescription set using results from initial 6 min walk test and THRR while considering  patient's activity barriers and safety.   Exercise Prescription Goal: Initial exercise prescription builds to 30-45 minutes a day of aerobic activity, 2-3 days per week.  Home exercise guidelines will be given to patient during program as part of exercise prescription that the participant will acknowledge.  Activity Barriers & Risk Stratification:  Activity Barriers & Cardiac Risk Stratification - 11/07/23 1343       Activity Barriers & Cardiac Risk Stratification   Activity Barriers Deconditioning;Muscular Weakness;Shortness of Breath;Balance Concerns;History of Falls;Other (comment)    Comments bilateral shoulder replacements          6 Minute Walk:  6 Minute Walk     Row Name 11/07/23 1517         6 Minute Walk   Phase Initial     Distance 1080 feet  Walk Time 6 minutes     # of Rest Breaks 2  1:52-2:25, 4:51-5:21     MPH 2.05     METS 1.5     RPE 15     Perceived Dyspnea  3     VO2 Peak 5.26     Resting HR 50 bpm     Resting BP 132/64     Resting Oxygen  Saturation  95 %     Exercise Oxygen  Saturation  during 6 min walk 86 %  O2 dropped to 86%, stopped pt and O2 recovered within 10 seconds      Max Ex. HR 92 bpm     Max Ex. BP 170/74     2 Minute Post BP 140/70       Interval HR   1 Minute HR 57     2 Minute HR 78     3 Minute HR 79     4 Minute HR 81     5 Minute HR 92     6 Minute HR 86     2 Minute Post HR 62     Interval Heart Rate? Yes       Interval Oxygen    Interval Oxygen ? Yes     Baseline Oxygen  Saturation % 95 %     1 Minute Oxygen  Saturation % 99 %     1 Minute Liters of Oxygen  0 L     2 Minute Oxygen  Saturation % 94 %     2 Minute Liters of Oxygen  0 L     3 Minute Oxygen  Saturation % 90 %     3 Minute Liters of Oxygen  0 L     4 Minute Oxygen  Saturation % 87 %  stopped pt and O2 recovered w/in 10 seconds     4 Minute Liters of Oxygen  0 L     5 Minute Oxygen  Saturation % 86 %  stopped pt and O2 recovered w/in 10 seconds     5 Minute Liters of Oxygen  0 L     6 Minute Oxygen  Saturation % 91 %     6 Minute Liters of Oxygen  0 L     2 Minute Post Oxygen  Saturation % 97 %     2 Minute Post Liters of Oxygen  0 L        Oxygen  Initial Assessment:  Oxygen  Initial Assessment - 11/07/23 1345       Home Oxygen    Home Oxygen  Device None    Sleep Oxygen  Prescription None    Home Exercise Oxygen  Prescription None    Home Resting Oxygen  Prescription None      Initial 6 min Walk   Oxygen  Used None      Program Oxygen  Prescription   Program Oxygen  Prescription None      Intervention   Short Term Goals To learn and exhibit compliance with exercise, home and travel O2 prescription;To learn and understand importance of maintaining oxygen  saturations>88%;To learn and demonstrate proper use of respiratory medications;To learn and demonstrate proper pursed lip breathing techniques or other breathing techniques. ;To learn and understand importance of monitoring SPO2 with pulse oximeter and demonstrate accurate use of the pulse oximeter.    Long  Term Goals Verbalizes importance of monitoring SPO2 with pulse oximeter and return demonstration;Exhibits proper breathing  techniques, such as pursed lip breathing or other method taught during program session;Demonstrates proper use of MDI's;Maintenance of O2 saturations>88%;Compliance with respiratory medication;Exhibits compliance with exercise, home  and travel O2  prescription          Oxygen  Re-Evaluation:  Oxygen  Re-Evaluation     Row Name 11/07/23 1547             Program Oxygen  Prescription   Program Oxygen  Prescription None         Home Oxygen    Home Oxygen  Device None       Sleep Oxygen  Prescription None       Home Exercise Oxygen  Prescription None       Home Resting Oxygen  Prescription None         Goals/Expected Outcomes   Short Term Goals To learn and exhibit compliance with exercise, home and travel O2 prescription;To learn and understand importance of maintaining oxygen  saturations>88%;To learn and demonstrate proper use of respiratory medications;To learn and demonstrate proper pursed lip breathing techniques or other breathing techniques. ;To learn and understand importance of monitoring SPO2 with pulse oximeter and demonstrate accurate use of the pulse oximeter.       Long  Term Goals Verbalizes importance of monitoring SPO2 with pulse oximeter and return demonstration;Exhibits proper breathing techniques, such as pursed lip breathing or other method taught during program session;Demonstrates proper use of MDI's;Maintenance of O2 saturations>88%;Compliance with respiratory medication;Exhibits compliance with exercise, home  and travel O2 prescription       Goals/Expected Outcomes Compliance and understanding of oxygen  saturation monitoring and breathing techniques to decrease shortness of breath.          Oxygen  Discharge (Final Oxygen  Re-Evaluation):  Oxygen  Re-Evaluation - 11/07/23 1547       Program Oxygen  Prescription   Program Oxygen  Prescription None      Home Oxygen    Home Oxygen  Device None    Sleep Oxygen  Prescription None    Home Exercise Oxygen  Prescription None     Home Resting Oxygen  Prescription None      Goals/Expected Outcomes   Short Term Goals To learn and exhibit compliance with exercise, home and travel O2 prescription;To learn and understand importance of maintaining oxygen  saturations>88%;To learn and demonstrate proper use of respiratory medications;To learn and demonstrate proper pursed lip breathing techniques or other breathing techniques. ;To learn and understand importance of monitoring SPO2 with pulse oximeter and demonstrate accurate use of the pulse oximeter.    Long  Term Goals Verbalizes importance of monitoring SPO2 with pulse oximeter and return demonstration;Exhibits proper breathing techniques, such as pursed lip breathing or other method taught during program session;Demonstrates proper use of MDI's;Maintenance of O2 saturations>88%;Compliance with respiratory medication;Exhibits compliance with exercise, home  and travel O2 prescription    Goals/Expected Outcomes Compliance and understanding of oxygen  saturation monitoring and breathing techniques to decrease shortness of breath.          Initial Exercise Prescription:  Initial Exercise Prescription - 11/07/23 1500       Date of Initial Exercise RX and Referring Provider   Date 11/07/23    Referring Provider Dr. Praveen Mannam    Expected Discharge Date 02/14/24      Recumbant Bike   Level 1    Minutes 15    METs 1.2      NuStep   Level 1    SPM 60    Minutes 15    METs 1.4      Prescription Details   Frequency (times per week) 2    Duration Progress to 30 minutes of continuous aerobic without signs/symptoms of physical distress      Intensity   THRR 40-80% of Max Heartrate  54-108    Ratings of Perceived Exertion 11-13    Perceived Dyspnea 0-4      Progression   Progression Continue to progress workloads to maintain intensity without signs/symptoms of physical distress.      Resistance Training   Training Prescription Yes    Weight red bands    Reps 10-15           Perform Capillary Blood Glucose checks as needed.  Exercise Prescription Changes:   Exercise Comments:   Exercise Goals and Review:   Exercise Goals     Row Name 11/07/23 1343             Exercise Goals   Increase Physical Activity Yes       Intervention Develop an individualized exercise prescription for aerobic and resistive training based on initial evaluation findings, risk stratification, comorbidities and participant's personal goals.;Provide advice, education, support and counseling about physical activity/exercise needs.       Expected Outcomes Short Term: Attend rehab on a regular basis to increase amount of physical activity.;Long Term: Exercising regularly at least 3-5 days a week.;Long Term: Add in home exercise to make exercise part of routine and to increase amount of physical activity.       Increase Strength and Stamina Yes       Intervention Provide advice, education, support and counseling about physical activity/exercise needs.;Develop an individualized exercise prescription for aerobic and resistive training based on initial evaluation findings, risk stratification, comorbidities and participant's personal goals.       Expected Outcomes Short Term: Increase workloads from initial exercise prescription for resistance, speed, and METs.;Short Term: Perform resistance training exercises routinely during rehab and add in resistance training at home;Long Term: Improve cardiorespiratory fitness, muscular endurance and strength as measured by increased METs and functional capacity ( )       Able to understand and use rate of perceived exertion (RPE) scale Yes       Intervention Provide education and explanation on how to use RPE scale       Expected Outcomes Short Term: Able to use RPE daily in rehab to express subjective intensity level;Long Term:  Able to use RPE to guide intensity level when exercising independently       Able to understand and use Dyspnea  scale Yes       Intervention Provide education and explanation on how to use Dyspnea scale       Expected Outcomes Short Term: Able to use Dyspnea scale daily in rehab to express subjective sense of shortness of breath during exertion;Long Term: Able to use Dyspnea scale to guide intensity level when exercising independently       Knowledge and understanding of Target Heart Rate Range (THRR) Yes       Intervention Provide education and explanation of THRR including how the numbers were predicted and where they are located for reference       Expected Outcomes Short Term: Able to state/look up THRR;Short Term: Able to use daily as guideline for intensity in rehab;Long Term: Able to use THRR to govern intensity when exercising independently       Understanding of Exercise Prescription Yes       Intervention Provide education, explanation, and written materials on patient's individual exercise prescription       Expected Outcomes Short Term: Able to explain program exercise prescription;Long Term: Able to explain home exercise prescription to exercise independently          Exercise Goals Re-Evaluation :  Exercise Goals Re-Evaluation     Row Name 11/07/23 1545             Exercise Goal Re-Evaluation   Exercise Goals Review Increase Physical Activity;Able to understand and use Dyspnea scale;Understanding of Exercise Prescription;Increase Strength and Stamina;Knowledge and understanding of Target Heart Rate Range (THRR);Able to understand and use rate of perceived exertion (RPE) scale       Comments Pt to begin exercise next week. Will monitor for progression.       Expected Outcomes Through exercise at rehab and home, the patient will decrease shortness of breath with daily activities and feel confident in carrying out an exercise regimen at home.          Discharge Exercise Prescription (Final Exercise Prescription Changes):   Nutrition:  Target Goals: Understanding of nutrition  guidelines, daily intake of sodium 1500mg , cholesterol 200mg , calories 30% from fat and 7% or less from saturated fats, daily to have 5 or more servings of fruits and vegetables.  Biometrics:  Pre Biometrics - 11/07/23 1522       Pre Biometrics   Grip Strength 13 kg           Nutrition Therapy Plan and Nutrition Goals:   Nutrition Assessments:  MEDIFICTS Score Key: >=70 Need to make dietary changes  40-70 Heart Healthy Diet <= 40 Therapeutic Level Cholesterol Diet   Picture Your Plate Scores: <59 Unhealthy dietary pattern with much room for improvement. 41-50 Dietary pattern unlikely to meet recommendations for good health and room for improvement. 51-60 More healthful dietary pattern, with some room for improvement.  >60 Healthy dietary pattern, although there may be some specific behaviors that could be improved.    Nutrition Goals Re-Evaluation:   Nutrition Goals Discharge (Final Nutrition Goals Re-Evaluation):   Psychosocial: Target Goals: Acknowledge presence or absence of significant depression and/or stress, maximize coping skills, provide positive support system. Participant is able to verbalize types and ability to use techniques and skills needed for reducing stress and depression.  Initial Review & Psychosocial Screening:  Initial Psych Review & Screening - 11/07/23 1346       Initial Review   Current issues with None Identified      Family Dynamics   Good Support System? Yes      Barriers   Psychosocial barriers to participate in program There are no identifiable barriers or psychosocial needs.      Screening Interventions   Interventions Encouraged to exercise    Expected Outcomes Short Term goal: Utilizing psychosocial counselor, staff and physician to assist with identification of specific Stressors or current issues interfering with healing process. Setting desired goal for each stressor or current issue identified.;Long Term Goal: Stressors  or current issues are controlled or eliminated.;Short Term goal: Identification and review with participant of any Quality of Life or Depression concerns found by scoring the questionnaire.;Long Term goal: The participant improves quality of Life and PHQ9 Scores as seen by post scores and/or verbalization of changes          Quality of Life Scores:  Scores of 19 and below usually indicate a poorer quality of life in these areas.  A difference of  2-3 points is a clinically meaningful difference.  A difference of 2-3 points in the total score of the Quality of Life Index has been associated with significant improvement in overall quality of life, self-image, physical symptoms, and general health in studies assessing change in quality of life.  PHQ-9: Review Flowsheet  More  data exists      11/07/2023 03/29/2023 10/19/2022 08/30/2022 08/25/2022  Depression screen PHQ 2/9  Decreased Interest 1 2 2 3 1   Down, Depressed, Hopeless 0 1 1 1 1   PHQ - 2 Score 1 3 3 4 2   Altered sleeping 1 2 3 2 1   Tired, decreased energy 1 2 3 3 1   Change in appetite 0 2 2 1  0  Feeling bad or failure about yourself  0 0 0 0 1  Trouble concentrating 0 1 1 1  0  Moving slowly or fidgety/restless 0 0 0 0 1  Suicidal thoughts 0 0 0 0 0  PHQ-9 Score 3 10 12 11 6   Difficult doing work/chores Somewhat difficult Somewhat difficult Somewhat difficult Somewhat difficult Somewhat difficult   Interpretation of Total Score  Total Score Depression Severity:  1-4 = Minimal depression, 5-9 = Mild depression, 10-14 = Moderate depression, 15-19 = Moderately severe depression, 20-27 = Severe depression   Psychosocial Evaluation and Intervention:  Psychosocial Evaluation - 11/07/23 1348       Psychosocial Evaluation & Interventions   Interventions Encouraged to exercise with the program and follow exercise prescription    Comments Pt denies any psy/soc barriers. Stated that recently her freezer stopped working and she is upset to  have lost her frozen food. Stated she is working on getting it fixed. Pt stated she has great support from her daughters and eldest granddaughter. Denies any additional needs or resources at this time.    Expected Outcomes For Gigi to participate in PR free of any psy/soc barriers or conerns. To get her freezer fixed    Continue Psychosocial Services  No Follow up required          Psychosocial Re-Evaluation:  Psychosocial Re-Evaluation     Row Name 11/09/23 0929             Psychosocial Re-Evaluation   Current issues with None Identified       Comments Psychosocial monthly re-evaluation is as follows: No changes since orientation. Monick is scheduled to start the program next week. She declined any referrals or additional resources at that time.       Expected Outcomes For Cloyce to participate in PR free of any psy/soc barriers or conerns.       Interventions Encouraged to attend Pulmonary Rehabilitation for the exercise       Continue Psychosocial Services  No Follow up required          Psychosocial Discharge (Final Psychosocial Re-Evaluation):  Psychosocial Re-Evaluation - 11/09/23 0929       Psychosocial Re-Evaluation   Current issues with None Identified    Comments Psychosocial monthly re-evaluation is as follows: No changes since orientation. Samanvi is scheduled to start the program next week. She declined any referrals or additional resources at that time.    Expected Outcomes For Kris to participate in PR free of any psy/soc barriers or conerns.    Interventions Encouraged to attend Pulmonary Rehabilitation for the exercise    Continue Psychosocial Services  No Follow up required          Education: Education Goals: Education classes will be provided on a weekly basis, covering required topics. Participant will state understanding/return demonstration of topics presented.  Learning Barriers/Preferences:  Learning Barriers/Preferences - 11/07/23 1348       Learning  Barriers/Preferences   Learning Barriers None    Learning Preferences None          Education Topics: Know  Your Numbers Group instruction that is supported by a PowerPoint presentation. Instructor discusses importance of knowing and understanding resting, exercise, and post-exercise oxygen  saturation, heart rate, and blood pressure. Oxygen  saturation, heart rate, blood pressure, rating of perceived exertion, and dyspnea are reviewed along with a normal range for these values.    Exercise for the Pulmonary Patient Group instruction that is supported by a PowerPoint presentation. Instructor discusses benefits of exercise, core components of exercise, frequency, duration, and intensity of an exercise routine, importance of utilizing pulse oximetry during exercise, safety while exercising, and options of places to exercise outside of rehab.    MET Level  Group instruction provided by PowerPoint, verbal discussion, and written material to support subject matter. Instructor reviews what METs are and how to increase METs.    Pulmonary Medications Verbally interactive group education provided by instructor with focus on inhaled medications and proper administration.   Anatomy and Physiology of the Respiratory System Group instruction provided by PowerPoint, verbal discussion, and written material to support subject matter. Instructor reviews respiratory cycle and anatomical components of the respiratory system and their functions. Instructor also reviews differences in obstructive and restrictive respiratory diseases with examples of each.    Oxygen  Safety Group instruction provided by PowerPoint, verbal discussion, and written material to support subject matter. There is an overview of "What is Oxygen " and "Why do we need it".  Instructor also reviews how to create a safe environment for oxygen  use, the importance of using oxygen  as prescribed, and the risks of noncompliance. There is a brief  discussion on traveling with oxygen  and resources the patient may utilize.   Oxygen  Use Group instruction provided by PowerPoint, verbal discussion, and written material to discuss how supplemental oxygen  is prescribed and different types of oxygen  supply systems. Resources for more information are provided.    Breathing Techniques Group instruction that is supported by demonstration and informational handouts. Instructor discusses the benefits of pursed lip and diaphragmatic breathing and detailed demonstration on how to perform both.     Risk Factor Reduction Group instruction that is supported by a PowerPoint presentation. Instructor discusses the definition of a risk factor, different risk factors for pulmonary disease, and how the heart and lungs work together.   Pulmonary Diseases Group instruction provided by PowerPoint, verbal discussion, and written material to support subject matter. Instructor gives an overview of the different type of pulmonary diseases. There is also a discussion on risk factors and symptoms as well as ways to manage the diseases.   Stress and Energy Conservation Group instruction provided by PowerPoint, verbal discussion, and written material to support subject matter. Instructor gives an overview of stress and the impact it can have on the body. Instructor also reviews ways to reduce stress. There is also a discussion on energy conservation and ways to conserve energy throughout the day.   Warning Signs and Symptoms Group instruction provided by PowerPoint, verbal discussion, and written material to support subject matter. Instructor reviews warning signs and symptoms of stroke, heart attack, cold and flu. Instructor also reviews ways to prevent the spread of infection.   Other Education Group or individual verbal, written, or video instructions that support the educational goals of the pulmonary rehab program.    Knowledge Questionnaire Score:   Knowledge Questionnaire Score - 11/07/23 1458       Knowledge Questionnaire Score   Pre Score 17/18          Core Components/Risk Factors/Patient Goals at Admission:  Personal Goals  and Risk Factors at Admission - 11/07/23 1525       Core Components/Risk Factors/Patient Goals on Admission   Improve shortness of breath with ADL's Yes    Intervention Provide education, individualized exercise plan and daily activity instruction to help decrease symptoms of SOB with activities of daily living.    Expected Outcomes Short Term: Improve cardiorespiratory fitness to achieve a reduction of symptoms when performing ADLs;Long Term: Be able to perform more ADLs without symptoms or delay the onset of symptoms          Core Components/Risk Factors/Patient Goals Review:   Goals and Risk Factor Review     Row Name 11/09/23 0931             Core Components/Risk Factors/Patient Goals Review   Personal Goals Review Improve shortness of breath with ADL's;Develop more efficient breathing techniques such as purse lipped breathing and diaphragmatic breathing and practicing self-pacing with activity.       Review Monthly review of patient's Core Components/Risk Factors/Patient Goals are as follows: Unable to assess goals yet. Connelly is scheduled to start the program next week.       Expected Outcomes Pt will show progress toward meeting expected goals and outcomes.          Core Components/Risk Factors/Patient Goals at Discharge (Final Review):   Goals and Risk Factor Review - 11/09/23 0931       Core Components/Risk Factors/Patient Goals Review   Personal Goals Review Improve shortness of breath with ADL's;Develop more efficient breathing techniques such as purse lipped breathing and diaphragmatic breathing and practicing self-pacing with activity.    Review Monthly review of patient's Core Components/Risk Factors/Patient Goals are as follows: Unable to assess goals yet. Kianna is scheduled to start  the program next week.    Expected Outcomes Pt will show progress toward meeting expected goals and outcomes.          ITP Comments:   Comments: Pt is expected to make progress toward Pulmonary Rehab goals. She is scheduled to start the program next week. Recommend continued exercise, life style modification, education, and utilization of breathing techniques to increase stamina and strength, while also decreasing shortness of breath with exertion.  Dr. Slater Staff is Medical Director for Pulmonary Rehab at [facility].

## 2023-11-09 NOTE — Telephone Encounter (Signed)
 Cardiac Catheterization scheduled at [facility] for: Friday November 11, 2023 7:30 AM Arrival time [facility] Main Entrance A at: 5:30 AM  Diet: -Nothing to eat after midnight.  Hydration: -May drink clear liquids until 2 hours before the procedure  Approved liquids: Water , clear tea, black coffee, fruit juices-non-citric and without pulp,Gatorade, plain Jello/popsicles.   -Please drink 16 oz of water  2 hours before procedure.   Medication instructions: -Hold:  Insulin -AM of procedure 1/2 usual Insulin  dose  Spironolactone /Lasix -AM of procedure  -Other usual morning medications can be taken including aspirin  81 mg.  Plan to go home the same day, you will only stay overnight if medically necessary.  You must have responsible adult to drive you home.  Someone must be with you the first 24 hours after you arrive home.  Reviewed procedure instructions with patient.

## 2023-11-10 NOTE — Telephone Encounter (Signed)
 Received fax from [employer] stating the date beside the provider's signature is unclear. Updated form with the year written clearly and faxed back to [employer].

## 2023-11-11 ENCOUNTER — Ambulatory Visit (HOSPITAL_COMMUNITY)
Admission: RE | Admit: 2023-11-11 | Discharge: 2023-11-11 | Disposition: A | Discharge status: Home / Self Care | Attending: Cardiovascular Disease | Admitting: Cardiovascular Disease

## 2023-11-11 ENCOUNTER — Encounter (HOSPITAL_COMMUNITY)
Admission: RE | Disposition: A | Discharge status: Home / Self Care | Payer: Self-pay | Source: Home / Self Care | Attending: Cardiovascular Disease

## 2023-11-11 ENCOUNTER — Other Ambulatory Visit: Payer: Self-pay

## 2023-11-11 ENCOUNTER — Encounter (HOSPITAL_COMMUNITY)

## 2023-11-11 DIAGNOSIS — I272 Pulmonary hypertension, unspecified: Secondary | ICD-10-CM | POA: Diagnosis not present

## 2023-11-11 DIAGNOSIS — I5032 Chronic diastolic (congestive) heart failure: Secondary | ICD-10-CM | POA: Diagnosis not present

## 2023-11-11 DIAGNOSIS — J449 Chronic obstructive pulmonary disease, unspecified: Secondary | ICD-10-CM | POA: Insufficient documentation

## 2023-11-11 DIAGNOSIS — I25810 Atherosclerosis of coronary artery bypass graft(s) without angina pectoris: Secondary | ICD-10-CM | POA: Diagnosis not present

## 2023-11-11 DIAGNOSIS — I25119 Atherosclerotic heart disease of native coronary artery with unspecified angina pectoris: Secondary | ICD-10-CM | POA: Diagnosis present

## 2023-11-11 DIAGNOSIS — I2582 Chronic total occlusion of coronary artery: Secondary | ICD-10-CM | POA: Diagnosis not present

## 2023-11-11 DIAGNOSIS — E119 Type 2 diabetes mellitus without complications: Secondary | ICD-10-CM | POA: Insufficient documentation

## 2023-11-11 DIAGNOSIS — I2510 Atherosclerotic heart disease of native coronary artery without angina pectoris: Secondary | ICD-10-CM | POA: Diagnosis not present

## 2023-11-11 DIAGNOSIS — I110 Hypertensive heart disease with heart failure: Secondary | ICD-10-CM | POA: Insufficient documentation

## 2023-11-11 HISTORY — PX: RIGHT/LEFT HEART CATH AND CORONARY/GRAFT ANGIOGRAPHY: CATH118267

## 2023-11-11 LAB — POCT I-STAT EG7
Acid-Base Excess: 1 mmol/L (ref 0.0–2.0)
Bicarbonate: 28.2 mmol/L — ABNORMAL HIGH (ref 20.0–28.0)
Calcium, Ion: 1.14 mmol/L — ABNORMAL LOW (ref 1.15–1.40)
HCT: 34 % — ABNORMAL LOW (ref 36.0–46.0)
Hemoglobin: 11.6 g/dL — ABNORMAL LOW (ref 12.0–15.0)
O2 Saturation: 59 %
Potassium: 3.9 mmol/L (ref 3.5–5.1)
Sodium: 137 mmol/L (ref 135–145)
TCO2: 30 mmol/L (ref 22–32)
pCO2, Ven: 55.4 mmHg (ref 44–60)
pH, Ven: 7.315 (ref 7.25–7.43)
pO2, Ven: 34 mmHg (ref 32–45)

## 2023-11-11 LAB — POCT I-STAT 7, (LYTES, BLD GAS, ICA,H+H)
Acid-Base Excess: 2 mmol/L (ref 0.0–2.0)
Bicarbonate: 28.3 mmol/L — ABNORMAL HIGH (ref 20.0–28.0)
Calcium, Ion: 1.19 mmol/L (ref 1.15–1.40)
HCT: 35 % — ABNORMAL LOW (ref 36.0–46.0)
Hemoglobin: 11.9 g/dL — ABNORMAL LOW (ref 12.0–15.0)
O2 Saturation: 85 %
Potassium: 4 mmol/L (ref 3.5–5.1)
Sodium: 139 mmol/L (ref 135–145)
TCO2: 30 mmol/L (ref 22–32)
pCO2 arterial: 51.1 mmHg — ABNORMAL HIGH (ref 32–48)
pH, Arterial: 7.351 (ref 7.35–7.45)
pO2, Arterial: 53 mmHg — ABNORMAL LOW (ref 83–108)

## 2023-11-11 LAB — GLUCOSE, CAPILLARY
Glucose-Capillary: 114 mg/dL — ABNORMAL HIGH (ref 70–99)
Glucose-Capillary: 130 mg/dL — ABNORMAL HIGH (ref 70–99)

## 2023-11-11 SURGERY — RIGHT/LEFT HEART CATH AND CORONARY/GRAFT ANGIOGRAPHY
Anesthesia: LOCAL

## 2023-11-11 MED ORDER — LABETALOL HCL 5 MG/ML IV SOLN: 10.0000 mg | INTRAVENOUS | Status: DC | PRN

## 2023-11-11 MED ORDER — VERAPAMIL HCL 2.5 MG/ML IV SOLN
INTRAVENOUS | Status: DC | PRN
  Administered 2023-11-11: 10 mL via INTRA_ARTERIAL

## 2023-11-11 MED ORDER — ONDANSETRON HCL 4 MG/2ML IJ SOLN: 4.0000 mg | Freq: Four times a day (QID) | INTRAMUSCULAR | Status: DC | PRN

## 2023-11-11 MED ORDER — SODIUM CHLORIDE 0.9% FLUSH: 3.0000 mL | INTRAVENOUS | Status: DC | PRN

## 2023-11-11 MED ORDER — HEPARIN SODIUM (PORCINE) 1000 UNIT/ML IJ SOLN
INTRAMUSCULAR | Status: DC | PRN
  Administered 2023-11-11: 4000 [IU] via INTRAVENOUS

## 2023-11-11 MED ORDER — HEPARIN (PORCINE) IN NACL 1000-0.9 UT/500ML-% IV SOLN
INTRAVENOUS | Status: DC | PRN
  Administered 2023-11-11: 1000 mL via SURGICAL_CAVITY

## 2023-11-11 MED ORDER — VERAPAMIL HCL 2.5 MG/ML IV SOLN
INTRAVENOUS | Status: AC
Start: 2023-11-11 — End: 2023-11-11
  Filled 2023-11-11: qty 2

## 2023-11-11 MED ORDER — ACETAMINOPHEN 325 MG PO TABS
650.0000 mg | ORAL_TABLET | ORAL | Status: DC | PRN
  Administered 2023-11-11: 650 mg via ORAL
  Filled 2023-11-11: qty 2

## 2023-11-11 MED ORDER — LIDOCAINE HCL (PF) 1 % IJ SOLN
INTRAMUSCULAR | Status: AC
  Filled 2023-11-11: qty 30

## 2023-11-11 MED ORDER — MIDAZOLAM HCL 2 MG/2ML IJ SOLN
INTRAMUSCULAR | Status: DC | PRN
  Administered 2023-11-11: 2 mg via INTRAVENOUS

## 2023-11-11 MED ORDER — SODIUM CHLORIDE 0.9% FLUSH
3.0000 mL | INTRAVENOUS | Status: DC | PRN
Start: 2023-11-11 — End: 2023-11-11

## 2023-11-11 MED ORDER — IOHEXOL 350 MG/ML SOLN
INTRAVENOUS | Status: DC | PRN
  Administered 2023-11-11: 70 mL via INTRA_ARTERIAL

## 2023-11-11 MED ORDER — SODIUM CHLORIDE 0.9% FLUSH: 3.0000 mL | Freq: Two times a day (BID) | INTRAVENOUS | Status: DC

## 2023-11-11 MED ORDER — LIDOCAINE HCL (PF) 1 % IJ SOLN
INTRAMUSCULAR | Status: DC | PRN
  Administered 2023-11-11: 2 mL

## 2023-11-11 MED ORDER — ASPIRIN 81 MG PO CHEW
81.0000 mg | CHEWABLE_TABLET | ORAL | Status: DC
Start: 2023-11-11 — End: 2023-11-11

## 2023-11-11 MED ORDER — MIDAZOLAM HCL 2 MG/2ML IJ SOLN
INTRAMUSCULAR | Status: AC
  Filled 2023-11-11: qty 2

## 2023-11-11 MED ORDER — FREE WATER: 500.0000 mL | Freq: Once | Status: DC

## 2023-11-11 MED ORDER — SODIUM CHLORIDE 0.9 % IV SOLN: 250.0000 mL | INTRAVENOUS | Status: DC | PRN

## 2023-11-11 MED ORDER — HYDRALAZINE HCL 20 MG/ML IJ SOLN: 10.0000 mg | INTRAMUSCULAR | Status: DC | PRN

## 2023-11-11 MED ORDER — HEPARIN SODIUM (PORCINE) 1000 UNIT/ML IJ SOLN
INTRAMUSCULAR | Status: AC
  Filled 2023-11-11: qty 10

## 2023-11-11 SURGICAL SUPPLY — 12 items
CATH BALLN WEDGE 5F 110CM (CATHETERS) IMPLANT
CATH INFINITI 5FR MULTPACK ANG (CATHETERS) IMPLANT
DEVICE RAD COMP TR BAND LRG (VASCULAR PRODUCTS) IMPLANT
GLIDESHEATH SLEND SS 6F .021 (SHEATH) IMPLANT
GUIDEWIRE .025 260CM (WIRE) IMPLANT
GUIDEWIRE INQWIRE 1.5J.035X260 (WIRE) IMPLANT
KIT SYRINGE INJ CVI SPIKEX1 (MISCELLANEOUS) IMPLANT
PACK CARDIAC CATHETERIZATION (CUSTOM PROCEDURE TRAY) ×2 IMPLANT
SET ATX-X65L (MISCELLANEOUS) IMPLANT
SHEATH GLIDE SLENDER 4/5FR (SHEATH) IMPLANT
STATION PROTECTION PRESSURIZED (MISCELLANEOUS) IMPLANT
WIRE MICROINTRODUCER 60CM (WIRE) IMPLANT

## 2023-11-11 NOTE — Progress Notes (Signed)
 Patient and daughter was given discharge instructions. Both verbalized understanding.

## 2023-11-11 NOTE — Discharge Instructions (Signed)

## 2023-11-11 NOTE — Interval H&P Note (Signed)
 History and Physical Interval Note:  11/11/2023 7:51 AM  [name]  has presented today for surgery, with the diagnosis of angina - hp.  The various methods of treatment have been discussed with the patient and family. After consideration of risks, benefits and other options for treatment, the patient has consented to  Procedure(s): RIGHT/LEFT HEART CATH AND CORONARY/GRAFT ANGIOGRAPHY (N/A) as a surgical intervention.  The patient's history has been reviewed, patient examined, no change in status, stable for surgery.  I have reviewed the patient's chart and labs.  Questions were answered to the patient's satisfaction.     Ozell Fell

## 2023-11-13 ENCOUNTER — Encounter (HOSPITAL_COMMUNITY): Payer: Self-pay | Admitting: Cardiovascular Disease

## 2023-11-14 ENCOUNTER — Encounter (HOSPITAL_COMMUNITY): Payer: Self-pay | Admitting: Cardiovascular Disease

## 2023-11-14 ENCOUNTER — Telehealth: Payer: Self-pay

## 2023-11-14 NOTE — Telephone Encounter (Signed)
 Copied from CRM #[id number]. Topic: Clinical - Prescription Issue >> Nov 09, 2023 12:36 PM Essie A wrote: Reason for CRM: Lavern from [employer] called because the prescription paperwork for Ohtuvayre  medication enrollment form has a date that is unclear or unreadable even though it was signed by Dr. Theophilus.  Please resend by fax with a clear date to [phone], phone number is [phone].  See 9/3 encounter.  This was faxed correctly.

## 2023-11-15 ENCOUNTER — Encounter (HOSPITAL_COMMUNITY)

## 2023-11-15 ENCOUNTER — Ambulatory Visit (HOSPITAL_COMMUNITY)
Admission: RE | Admit: 2023-11-15 | Discharge: 2023-11-15 | Disposition: A | Discharge status: Home / Self Care | Source: Ambulatory Visit

## 2023-11-15 DIAGNOSIS — R0602 Shortness of breath: Secondary | ICD-10-CM | POA: Diagnosis present

## 2023-11-16 ENCOUNTER — Telehealth: Payer: Self-pay

## 2023-11-16 NOTE — Telephone Encounter (Signed)
 Patient called asking to speak to nurse, she stated she has questions about the procedure she had. She wouldn't go into any further details.

## 2023-11-16 NOTE — Telephone Encounter (Signed)
 Called patient had patient verbalized she had procedure last Friday and came for appt to see provider on 10/7. Made pt aware that she did not have an appt with Dr. Ren on 10/7. Appointment made for patient on 10/31 @ 8:00 am  to see provider. Made patient aware to call office for any other questions. Patient verbalized an understanding. SABRA

## 2023-11-17 ENCOUNTER — Encounter (HOSPITAL_COMMUNITY)
Admission: RE | Admit: 2023-11-17 | Discharge: 2023-11-17 | Disposition: A | Discharge status: Home / Self Care | Source: Ambulatory Visit | Attending: Pulmonary Disease | Admitting: Pulmonary Disease

## 2023-11-17 VITALS — Wt 181.0 lb

## 2023-11-17 DIAGNOSIS — J449 Chronic obstructive pulmonary disease, unspecified: Secondary | ICD-10-CM | POA: Insufficient documentation

## 2023-11-17 LAB — GLUCOSE, CAPILLARY
Glucose-Capillary: 178 mg/dL — ABNORMAL HIGH (ref 70–99)
Glucose-Capillary: 106 mg/dL — ABNORMAL HIGH (ref 70–99)

## 2023-11-17 NOTE — Progress Notes (Signed)
 Daily Session Note  Patient Details  Name: [name] MRN: [id number] Date of Birth: 11-20-1940 Referring Provider:   Conrad Ports Pulmonary Rehab Walk Test from 11/07/2023 in [facility] for Heart, Vascular, & Lung Health  Referring Provider Dr. Lonna Coder    Encounter Date: 11/17/2023  Check In:  Session Check In - 11/17/23 1327       Check-In   Supervising physician immediately available to respond to emergencies [facility] MD immediately available    Physician(s) Josefa Beauvais, NP    Location [facility]    Staff Present Ronal Levin, RN, BSN;Mohit Zirbes Claudene, RT;Randi [place] BS, ACSM-CEP, Exercise Physiologist;Kaylee Nicholaus, MS, ACSM-CEP, Exercise Physiologist    Virtual Visit No    Medication changes reported     No    Fall or balance concerns reported    Yes    Comments unstable at times, dizziness, uses can when dizzy    Tobacco Cessation No Change    Warm-up and Cool-down Performed as group-led instruction   only   Resistance Training Performed Yes    VAD Patient? No    PAD/SET Patient? No      Pain Assessment   Currently in Pain? No/denies    Multiple Pain Sites No          Capillary Blood Glucose: Results for orders placed or performed during the hospital encounter of 11/17/23 (from the past 24 hours)  Glucose, capillary     Status: Abnormal   Collection Time: 11/17/23  2:47 PM  Result Value Ref Range   Glucose-Capillary 106 (H) 70 - 99 mg/dL   *Note: Due to a large number of results and/or encounters for the requested time period, some results have not been displayed. A complete set of results can be found in Results Review.      Social History   Tobacco Use  Smoking Status Former   Current packs/day: 0.00   Average packs/day: 0.5 packs/day for 21.0 years (10.5 ttl pk-yrs)   Types: Cigarettes   Start date: 02/09/1955   Quit date: 02/09/1976   Years since quitting: 47.8  Smokeless Tobacco Never    Goals Met:   Proper associated with RPD/PD & O2 Sat Independence with exercise equipment Exercise tolerated well No report of concerns or symptoms today Strength training completed today  Goals Unmet:  Not Applicable  Comments: Service time is from 1303 to 1445.    Dr. Slater Staff is Medical Director for Pulmonary Rehab at [facility].

## 2023-11-21 ENCOUNTER — Other Ambulatory Visit (HOSPITAL_COMMUNITY): Payer: Self-pay

## 2023-11-21 MED ORDER — APIXABAN 5 MG PO TABS
5.0000 mg | ORAL_TABLET | Freq: Two times a day (BID) | ORAL | 3 refills | Status: AC
  Filled 2023-11-21: qty 180, 90d supply, fill #0

## 2023-11-21 MED ORDER — APIXABAN 5 MG PO TABS
5.0000 mg | ORAL_TABLET | Freq: Two times a day (BID) | ORAL | 0 refills | Status: DC
Start: 2023-11-21 — End: 2023-12-21
  Filled 2023-11-21: qty 60, 30d supply, fill #0

## 2023-11-21 NOTE — Telephone Encounter (Signed)
 Made contact with Patient.  Shared the results and that a blood clot was found and Dr. Juluis plan to start Eliquis. Answered all questions and set up patient for a 30-day free fill and then continued medication refills.  Advised patient to stop Asprin once she starts Eliquis.   Lonni Glendia Richards RN, BSN, CCRN-A

## 2023-11-21 NOTE — Telephone Encounter (Signed)
 Received fax from [employer] with summary of benefits. Referral form for Ohtuvayre  received. Rx will be triaged to [facility].. Once benefits investigation completed, pharmacy will reach out the patient to schedule shipment. If medication is unaffordable, patient will need to express financial hardship to be referred back to [facility] for patient assistance program pre-screening.   Patient ID: [id number] Pharmacy phone: [phone] [facility] Phone#: [phone]

## 2023-11-21 NOTE — Telephone Encounter (Signed)
 Pt called in stating she has some additional questions she would like to talk about

## 2023-11-21 NOTE — Telephone Encounter (Signed)
-----   Message from Joelle VEAR Cedars Tonleu sent at 11/19/2023  5:19 PM EDT ----- Hi,       Please call Ms. Borbon and let her know that the ultrasound of her legs showed that she had a clot in the right leg.  She will need to be started on blood thinner to prevent more clots.  We  can stop the aspirin  and send a prescription for Eliquis 5 mg twice a day. Joelle VEAR Cedars Donley, MD @TD  5:17 PM ----- Message ----- From: Interface, [employer] Sent: 11/15/2023   5:10 PM EDT To: Joelle VEAR Cedars Donley, MD

## 2023-11-22 ENCOUNTER — Telehealth (HOSPITAL_COMMUNITY): Payer: Self-pay

## 2023-11-22 ENCOUNTER — Encounter (HOSPITAL_COMMUNITY): Admission: RE | Admit: 2023-11-22 | Source: Ambulatory Visit

## 2023-11-22 ENCOUNTER — Other Ambulatory Visit: Payer: Self-pay

## 2023-11-22 NOTE — Telephone Encounter (Signed)
 Patient called stating she got test results stating she has a blood clot in her R leg and wanted to know if she should come in for pulmonary rehab. Spoke with nurse navigator, confirmed that patient should abstain from pulmonary rehab until further notice.  Called patient back- no answer, left message letting her know not to come to class and that the pulmonary team will be in touch with her soon.

## 2023-11-22 NOTE — Telephone Encounter (Signed)
 Received fax from [facility] confirming receipt of prescription.

## 2023-11-23 ENCOUNTER — Telehealth (HOSPITAL_COMMUNITY): Payer: Self-pay

## 2023-11-23 NOTE — Telephone Encounter (Signed)
-----   Message from Joelle VEAR Cedars Tonleu sent at 11/23/2023  3:13 PM EDT ----- Regarding: RE: Resume Pulm Rehab Hi Ellina Sivertsen,     Can resume exercise whenever. Clot is not contraindication.   Thanks Joelle ----- Message ----- From: Harvy Shuck, RN Sent: 11/23/2023   1:50 PM EDT To: Joelle VEAR Cedars Donley, MD Subject: Resume Pulm Rehab                              Pt called rehab, reported she was diagnosed with a recent blood clot. She is currently in Pulmonary Rehab. When can she resume exercise?

## 2023-11-23 NOTE — Telephone Encounter (Signed)
 Spoke to pt. Awaiting clearance from Dr. Ren Ny regarding recent dx of a blood clot and resuming exercise. Pt understands that RN will call her when she can return

## 2023-11-23 NOTE — Telephone Encounter (Signed)
 Called [name] and let her know she is cleared to exercise in [facility]

## 2023-11-24 ENCOUNTER — Encounter (HOSPITAL_COMMUNITY)
Admission: RE | Admit: 2023-11-24 | Discharge: 2023-11-24 | Disposition: A | Discharge status: Home / Self Care | Source: Ambulatory Visit | Attending: Pulmonary Disease | Admitting: Pulmonary Disease

## 2023-11-24 DIAGNOSIS — J449 Chronic obstructive pulmonary disease, unspecified: Secondary | ICD-10-CM | POA: Diagnosis not present

## 2023-11-24 LAB — GLUCOSE, CAPILLARY
Glucose-Capillary: 164 mg/dL — ABNORMAL HIGH (ref 70–99)
Glucose-Capillary: 81 mg/dL (ref 70–99)

## 2023-11-24 NOTE — Progress Notes (Signed)
 Daily Session Note  Patient Details  Name: [name] MRN: [id number] Date of Birth: 12/16/1940 Referring Provider:   Conrad Ports Pulmonary Rehab Walk Test from 11/07/2023 in [facility] for Heart, Vascular, & Lung Health  Referring Provider Dr. Lonna Coder    Encounter Date: 11/24/2023  Check In:  Session Check In - 11/24/23 1325       Check-In   Supervising physician immediately available to respond to emergencies [facility] MD immediately available    Physician(s) Rosabel Mose, NP    Location [facility]    Staff Present Ronal Levin, RN, BSN;Casey Claudene, RT;Randi [place] BS, ACSM-CEP, Exercise Physiologist;Kaylee [place], MS, ACSM-CEP, Exercise Physiologist    Virtual Visit No    Medication changes reported     No    Fall or balance concerns reported    Yes    Comments unstable at times, dizziness, uses can when dizzy    Tobacco Cessation No Change    Warm-up and Cool-down Performed as group-led instruction   only   Resistance Training Performed Yes    VAD Patient? No    PAD/SET Patient? No      Pain Assessment   Currently in Pain? No/denies    Multiple Pain Sites No          Capillary Blood Glucose: Results for orders placed or performed during the hospital encounter of 11/24/23 (from the past 24 hours)  Glucose, capillary     Status: Abnormal   Collection Time: 11/24/23  1:15 PM  Result Value Ref Range   Glucose-Capillary 164 (H) 70 - 99 mg/dL  Glucose, capillary     Status: None   Collection Time: 11/24/23  2:41 PM  Result Value Ref Range   Glucose-Capillary 81 70 - 99 mg/dL   *Note: Due to a large number of results and/or encounters for the requested time period, some results have not been displayed. A complete set of results can be found in Results Review.      Social History   Tobacco Use  Smoking Status Former   Current packs/day: 0.00   Average packs/day: 0.5 packs/day for 21.0 years (10.5 ttl pk-yrs)    Types: Cigarettes   Start date: 02/09/1955   Quit date: 02/09/1976   Years since quitting: 47.8  Smokeless Tobacco Never    Goals Met:  Exercise tolerated well No report of concerns or symptoms today Strength training completed today  Goals Unmet:  Not Applicable  Comments: Service time is from 1308 to 1437    Dr. Slater Staff is Medical Director for Pulmonary Rehab at [facility].

## 2023-11-25 ENCOUNTER — Other Ambulatory Visit: Payer: Self-pay | Admitting: Internal Medicine

## 2023-11-28 ENCOUNTER — Telehealth: Payer: Self-pay

## 2023-11-28 NOTE — Telephone Encounter (Signed)
 Pt requested to speak with nurse. Did not wish to go into detail.

## 2023-11-28 NOTE — Telephone Encounter (Signed)
 Spoke with pt regarding her right leg. 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 Pt stated that her leg has been getting more swollen and tight since Thursday. Pt denies any redness or tenderness. Taken to DOD Dr. Ladona who stated there is no need to go to the ED. Ganji suggested the pt elevate her legs and wear compression stockings after she wakes up and to avoid excess salt. Pt agreeable to plan. Pt verbalized understanding. All questions if any were answered.

## 2023-11-29 ENCOUNTER — Telehealth (HOSPITAL_COMMUNITY): Payer: Self-pay

## 2023-11-29 ENCOUNTER — Encounter (HOSPITAL_COMMUNITY): Admission: RE | Admit: 2023-11-29

## 2023-11-29 NOTE — Telephone Encounter (Signed)
 Patient c/o for 1:15 PR class, states her foot and leg has swelling and her doctor told her to keep it elevated today.

## 2023-12-01 ENCOUNTER — Encounter (HOSPITAL_COMMUNITY): Admission: RE | Admit: 2023-12-01

## 2023-12-06 ENCOUNTER — Encounter (HOSPITAL_COMMUNITY)
Admission: RE | Admit: 2023-12-06 | Discharge: 2023-12-06 | Disposition: A | Discharge status: Home / Self Care | Source: Ambulatory Visit | Attending: Pulmonary Disease | Admitting: Pulmonary Disease

## 2023-12-06 VITALS — Wt 177.0 lb

## 2023-12-06 DIAGNOSIS — J449 Chronic obstructive pulmonary disease, unspecified: Secondary | ICD-10-CM

## 2023-12-06 NOTE — Progress Notes (Signed)
 Daily Session Note  Patient Details  Name: [name] MRN: [id number] Date of Birth: 1940/07/07 Referring Provider:   Conrad Ports Pulmonary Rehab Walk Test from 11/07/2023 in [facility] for Heart, Vascular, & Lung Health  Referring Provider Dr. Lonna Coder    Encounter Date: 12/06/2023  Check In:  Session Check In - 12/06/23 1415       Check-In   Supervising physician immediately available to respond to emergencies [facility] MD immediately available    Physician(s) Lum Louis, NP    [facility]    Staff Present Ronal Levin, RN, BSN;Halle Davlin Claudene, RT;Randi [place] BS, ACSM-CEP, Exercise Physiologist;Kaylee [place], MS, ACSM-CEP, Exercise Physiologist    Virtual Visit No    Medication changes reported     No    Fall or balance concerns reported    Yes    Comments unstable at times, dizziness, uses can when dizzy    Tobacco Cessation No Change    Warm-up and Cool-down Performed as group-led instruction   only   Resistance Training Performed Yes    VAD Patient? No    PAD/SET Patient? No      Pain Assessment   Currently in Pain? No/denies    Multiple Pain Sites No          Capillary Blood Glucose: No results found. However, due to the size of the patient record, not all encounters were searched. Please check Results Review for a complete set of results.   Exercise Prescription Changes - 12/06/23 1500       Response to Exercise   Blood Pressure (Admit) 112/62    Blood Pressure (Exercise) 142/72    Blood Pressure (Exit) 158/80    Heart Rate (Admit) 75 bpm    Heart Rate (Exercise) 78 bpm    Heart Rate (Exit) 69 bpm    Oxygen  Saturation (Admit) 97 %    Oxygen  Saturation (Exercise) 96 %    Oxygen  Saturation (Exit) 100 %    Rating of Perceived Exertion (Exercise) 14    Perceived Dyspnea (Exercise) 1    Duration Continue with 30 min of aerobic exercise without signs/symptoms of physical distress.    Intensity THRR  unchanged      Progression   Progression Continue to progress workloads to maintain intensity without signs/symptoms of physical distress.      Resistance Training   Training Prescription Yes    Weight red bands    Reps 10-15    Time 10 Minutes      NuStep   Level 1    SPM 87    Minutes 15    METs 2.2          Social History   Tobacco Use  Smoking Status Former   Current packs/day: 0.00   Average packs/day: 0.5 packs/day for 21.0 years (10.5 ttl pk-yrs)   Types: Cigarettes   Start date: 02/09/1955   Quit date: 02/09/1976   Years since quitting: 47.8  Smokeless Tobacco Never    Goals Met:  Proper associated with RPD/PD & O2 Sat Independence with exercise equipment Exercise tolerated well No report of concerns or symptoms today Strength training completed today  Goals Unmet:  Not Applicable  Comments: Service time is from 1303 to 1445.    Dr. Slater Staff is Medical Director for Pulmonary Rehab at [facility].

## 2023-12-07 LAB — GLUCOSE, CAPILLARY: Glucose-Capillary: 119 mg/dL — ABNORMAL HIGH (ref 70–99)

## 2023-12-07 NOTE — Progress Notes (Signed)
 Pulmonary Individual Treatment Plan  Patient Details  Name: [name] MRN: [id number] Date of Birth: 03-16-1940 Referring Provider:   Conrad Ports Pulmonary Rehab Walk Test from 11/07/2023 in [facility]  Referring Provider Dr. Lonna Coder    Initial Encounter Date:  Flowsheet Row Pulmonary Rehab Walk Test from 11/07/2023 in [facility]  Date 11/07/23    Visit Diagnosis: Stage 2 moderate COPD by GOLD classification (HCC)  Patient's Home Medications on Admission:   Current Outpatient Medications:    acetaminophen  (TYLENOL ) 325 MG tablet, Take 650 mg by mouth every 6 (six) hours as needed for moderate pain (pain score 4-6) or headache., Disp: , Rfl:    albuterol  (PROVENTIL  HFA;VENTOLIN  HFA) 108 (90 BASE) MCG/ACT inhaler, Inhale 2 puffs into the lungs every 6 (six) hours as needed. For shortness of breath., Disp: 1 Inhaler, Rfl: 0   albuterol  (PROVENTIL ) (2.5 MG/3ML) 0.083% nebulizer solution, Take 3 mLs (2.5 mg total) by nebulization every 6 (six) hours as needed for wheezing or shortness of breath., Disp: 360 mL, Rfl: 5   apixaban (ELIQUIS) 5 MG TABS tablet, Take 1 tablet (5 mg total) by mouth 2 (two) times daily., Disp: 60 tablet, Rfl: 0   apixaban (ELIQUIS) 5 MG TABS tablet, Take 1 tablet (5 mg total) by mouth 2 (two) times daily., Disp: 180 tablet, Rfl: 3   Blood Glucose Monitoring Suppl (ACCU-CHEK GUIDE) w/Device KIT, 1 kit by Does not apply route as directed., Disp: 1 kit, Rfl: 0   calcium  carbonate (TUMS - DOSED IN MG ELEMENTAL CALCIUM ) 500 MG chewable tablet, Chew 1 tablet by mouth daily as needed for indigestion or heartburn., Disp: , Rfl:    Cholecalciferol  (VITAMIN D ) 50 MCG (2000 UT) tablet, Take 4,000 Units by mouth daily., Disp: , Rfl:    Continuous Glucose Sensor (FREESTYLE LIBRE 3 PLUS SENSOR) MISC, 1 Device by Other route every 14 (fourteen) days.  Change sensor every 15 days. (Patient not taking: Reported on 11/07/2023), Disp: 6 each, Rfl: 3   Cyanocobalamin  (VITAMIN B-12 IJ), Inject 1,000 mcg into the muscle every 30 (thirty) days., Disp: , Rfl:    divalproex  (DEPAKOTE  ER) 250 MG 24 hr tablet, TAKE 1 TABLET (250 MG TOTAL) BY MOUTH AT BEDTIME. (Patient not taking: Reported on 11/10/2023), Disp: 90 tablet, Rfl: 1   ezetimibe  (ZETIA ) 10 MG tablet, Take 1 tablet (10 mg total) by mouth daily., Disp: 90 tablet, Rfl: 3   fluticasone  (FLONASE ) 50 MCG/ACT nasal spray, SPRAY 2 SPRAYS INTO EACH NOSTRIL EVERY DAY (Patient taking differently: Place 2 sprays into both nostrils daily as needed for allergies.), Disp: 48 mL, Rfl: 3   Fluticasone -Umeclidin-Vilant (TRELEGY ELLIPTA ) 200-62.5-25 MCG/ACT AEPB, Inhale 1 puff into the lungs daily at 6 (six) AM., Disp: 60 each, Rfl: 5   furosemide  (LASIX ) 20 MG tablet, Take 1 tablet (20 mg total) by mouth daily. Take 40 mg for the next 3 days then take 20 mg once a day. (Patient taking differently: Take 20 mg by mouth every other day.), Disp: 93 tablet, Rfl: 3   insulin  lispro protamine-lispro (HUMALOG  75/25 MIX) (75-25) 100 UNIT/ML SUSP injection, Inject 0.06 mLs (6 Units total) into the skin daily with breakfast AND 0.08 mLs (8 Units total) daily with supper. 10 units at lunch depending on bs reading. (Patient taking differently: Inject 10 units into the skin daily with breakfast and 5 units with lunch if needed  and 16 units at bedtime), Disp: 15 mL, Rfl: 3   Insulin  Syringe-Needle U-100 (INSULIN  SYRINGE .3CC/29GX1) 29G X 1 0.3 ML MISC, 1 Device by Does not apply route in the morning and at bedtime., Disp: 200 each, Rfl: 3   isosorbide  mononitrate (IMDUR ) 60 MG 24 hr tablet, Take one tablet ( 60 mg ) twice daily 8 hours apart. (Patient not taking: Reported on 11/10/2023), Disp: 180 tablet, Rfl: 3   meclizine  (ANTIVERT ) 25 MG tablet, Take 25 mg by mouth 2 (two) times daily as needed for dizziness., Disp: , Rfl:     meloxicam (MOBIC) 15 MG tablet, Take 15 mg by mouth daily as needed for pain., Disp: , Rfl:    metoprolol  succinate (TOPROL -XL) 100 MG 24 hr tablet, Take 100 mg by mouth daily., Disp: , Rfl:    montelukast  (SINGULAIR ) 10 MG tablet, Take 1 tablet (10 mg total) by mouth at bedtime., Disp: 30 tablet, Rfl: 11   Multiple Vitamin (MULTIVITAMIN WITH MINERALS) TABS, Take 1 tablet by mouth daily., Disp: , Rfl:    nitroGLYCERIN  (NITROSTAT ) 0.4 MG SL tablet, Place 1 tablet (0.4 mg total) under the tongue every 5 (five) minutes as needed for chest pain., Disp: 25 tablet, Rfl: 3   ondansetron  (ZOFRAN ) 4 MG tablet, TAKE 1 TABLET BY MOUTH EVERY 8 HOURS AS NEEDED FOR NAUSEA AND VOMITING, Disp: 20 tablet, Rfl: 1   pantoprazole  (PROTONIX ) 40 MG tablet, TAKE 1 TABLET BY MOUTH EVERY DAY, Disp: 90 tablet, Rfl: 2   sertraline  (ZOLOFT ) 100 MG tablet, TAKE 1 TABLET BY MOUTH EVERY DAY, Disp: 90 tablet, Rfl: 2   spironolactone  (ALDACTONE ) 25 MG tablet, TAKE 1 TABLET (25 MG TOTAL) BY MOUTH DAILY., Disp: 90 tablet, Rfl: 1   temazepam  (RESTORIL ) 30 MG capsule, Take 30 mg by mouth at bedtime., Disp: , Rfl:    tiZANidine  (ZANAFLEX ) 2 MG tablet, TAKE 1 TO 2 TABLETS (2-4 MG TOTAL) BY MOUTH AT BEDTIME AS NEEDED FOR MUSCLE SPASM, Disp: 180 tablet, Rfl: 0   Turmeric (QC TUMERIC COMPLEX PO), Take 1 capsule by mouth daily., Disp: , Rfl:   Past Medical History: Past Medical History:  Diagnosis Date   Adenomatous colon polyp    Allergy     Anxiety    Arthritis    Asthma    Carotid artery disease    carotid US  02/2017: bilat ICA 1-39%   Chronic diastolic CHF    Echo 02/2017: EF 65-70, Gr 2 DD, mild MS (mean 5), PASP 44   COPD    pt is unsure if has been officially diagnosed   Coronary artery disease    CABG '09- cathed 12/09, 9/10, 6/11, 3/14 and 12/13/16- medical Rx // cath 12/2016 - 2/3 grafts patent >> med Rx // Myoview  12/17: low risk   Diabetes mellitus    Dyspnea    with exertion   Gastroesophageal reflux disease     Headache    Hiatal hernia    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   Hyperlipidemia    Hypertension    Myocardial infarction (HCC)    PONV (postoperative nausea and vomiting)    Schatzki's ring    Shoulder injury    resolved after shoulder surgery   Sleep apnea    not on cpap    Tobacco Use: Social History   Tobacco Use  Smoking Status Former   Current packs/day: 0.00   Average packs/day: 0.5 packs/day for 21.0 years (10.5 ttl pk-yrs)   Types:  Cigarettes   Start date: 02/09/1955   Quit date: 02/09/1976   Years since quitting: 47.8  Smokeless Tobacco Never    Labs: Review Flowsheet  More data exists      Latest Ref Rng & Units 10/05/2022 03/17/2023 09/09/2023 11/04/2023 11/11/2023  Labs for ITP Cardiac and Pulmonary Rehab  Cholestrol 100 - 199 mg/dL - - - 751  -  LDL (calc) 0 - 99 mg/dL - - - 817  -  HDL-C >60 mg/dL - - - 38  -  Trlycerides 0 - 149 mg/dL - - - 850  -  Hemoglobin A1c 4.6 - 6.5 % 7.3  7.8  9.0  - -  PH, Arterial 7.35 - 7.45 - - - - 7.351   PCO2 arterial 32 - 48 mmHg - - - - 51.1   Bicarbonate 20.0 - 28.0 mmol/L - - - - 28.3  28.2   TCO2 22 - 32 mmol/L - - - - 30  30   O2 Saturation % - - - - 85  59     Details       Multiple values from one day are sorted in reverse-chronological order         Capillary Blood Glucose: Lab Results  Component Value Date   GLUCAP 119 (H) 12/06/2023   GLUCAP 81 11/24/2023   GLUCAP 164 (H) 11/24/2023   GLUCAP 106 (H) 11/17/2023   GLUCAP 178 (H) 11/17/2023     Pulmonary Assessment Scores:  Pulmonary Assessment Scores     Row Name 11/07/23 1345         ADL UCSD   ADL Phase Entry     SOB Score total 88       CAT Score   CAT Score 29       mMRC Score   mMRC Score 3       UCSD: Self-administered rating of dyspnea associated with activities of daily living (ADLs) 6-point scale (0 = not at all to 5 = maximal or unable to do because of breathlessness)  Scoring Scores range from 0 to 120.  Minimally  important difference is 5 units  CAT: CAT can identify the health impairment of COPD patients and is better correlated with disease progression.  CAT has a scoring range of zero to 40. The CAT score is classified into four groups of low (less than 10), medium (10 - 20), high (21-30) and very high (31-40) based on the impact level of disease on health status. A CAT score over 10 suggests significant symptoms.  A worsening CAT score could be explained by an exacerbation, poor medication adherence, poor inhaler technique, or progression of COPD or comorbid conditions.  CAT MCID is 2 points  mMRC: mMRC (Modified Medical Research Council) Dyspnea Scale is used to assess the degree of baseline functional disability in patients of respiratory disease due to dyspnea. No minimal important difference is established. A decrease in score of 1 point or greater is considered a positive change.   Pulmonary Function Assessment:  Pulmonary Function Assessment - 11/07/23 1523       Breath   Bilateral Breath Sounds Decreased    Shortness of Breath Yes;Fear of Shortness of Breath;Limiting activity          Exercise Target Goals: Exercise Program Goal: Individual exercise prescription set using results from initial 6 min walk test and THRR while considering  patient's activity barriers and safety.   Exercise Prescription Goal: Initial exercise prescription builds to 30-45 minutes a  day of aerobic activity, 2-3 days per week.  Home exercise guidelines will be given to patient during program as part of exercise prescription that the participant will acknowledge.  Activity Barriers & Risk Stratification:  Activity Barriers & Cardiac Risk Stratification - 11/07/23 1343       Activity Barriers & Cardiac Risk Stratification   Activity Barriers Deconditioning;Muscular Weakness;Shortness of Breath;Balance Concerns;History of Falls;Other (comment)    Comments bilateral shoulder replacements          6  Minute Walk:  6 Minute Walk     Row Name 11/07/23 1517         6 Minute Walk   Phase Initial     Distance 1080 feet     Walk Time 6 minutes     # of Rest Breaks 2  1:52-2:25, 4:51-5:21     MPH 2.05     METS 1.5     RPE 15     Perceived Dyspnea  3     VO2 Peak 5.26     Resting HR 50 bpm     Resting BP 132/64     Resting Oxygen  Saturation  95 %     Exercise Oxygen  Saturation  during 6 min walk 86 %  O2 dropped to 86%, stopped pt and O2 recovered within 10 seconds     Max Ex. HR 92 bpm     Max Ex. BP 170/74     2 Minute Post BP 140/70       Interval HR   1 Minute HR 57     2 Minute HR 78     3 Minute HR 79     4 Minute HR 81     5 Minute HR 92     6 Minute HR 86     2 Minute Post HR 62     Interval Heart Rate? Yes       Interval Oxygen    Interval Oxygen ? Yes     Baseline Oxygen  Saturation % 95 %     1 Minute Oxygen  Saturation % 99 %     1 Minute Liters of Oxygen  0 L     2 Minute Oxygen  Saturation % 94 %     2 Minute Liters of Oxygen  0 L     3 Minute Oxygen  Saturation % 90 %     3 Minute Liters of Oxygen  0 L     4 Minute Oxygen  Saturation % 87 %  stopped pt and O2 recovered w/in 10 seconds     4 Minute Liters of Oxygen  0 L     5 Minute Oxygen  Saturation % 86 %  stopped pt and O2 recovered w/in 10 seconds     5 Minute Liters of Oxygen  0 L     6 Minute Oxygen  Saturation % 91 %     6 Minute Liters of Oxygen  0 L     2 Minute Post Oxygen  Saturation % 97 %     2 Minute Post Liters of Oxygen  0 L        Oxygen  Initial Assessment:  Oxygen  Initial Assessment - 11/07/23 1345       Home Oxygen    Home Oxygen  Device None    Sleep Oxygen  Prescription None    Home Exercise Oxygen  Prescription None    Home Resting Oxygen  Prescription None      Initial 6 min Walk   Oxygen  Used None      Program Oxygen  Prescription  Program Oxygen  Prescription None      Intervention   Short Term Goals To learn and exhibit compliance with exercise, home and travel O2 prescription;To  learn and understand importance of maintaining oxygen  saturations>88%;To learn and demonstrate proper use of respiratory medications;To learn and demonstrate proper pursed lip breathing techniques or other breathing techniques. ;To learn and understand importance of monitoring SPO2 with pulse oximeter and demonstrate accurate use of the pulse oximeter.    Long  Term Goals Verbalizes importance of monitoring SPO2 with pulse oximeter and return demonstration;Exhibits proper breathing techniques, such as pursed lip breathing or other method taught during program session;Demonstrates proper use of MDI's;Maintenance of O2 saturations>88%;Compliance with respiratory medication;Exhibits compliance with exercise, home  and travel O2 prescription          Oxygen  Re-Evaluation:  Oxygen  Re-Evaluation     Row Name 11/07/23 1547 12/01/23 0910           Program Oxygen  Prescription   Program Oxygen  Prescription None None        Home Oxygen    Home Oxygen  Device None None      Sleep Oxygen  Prescription None None      Home Exercise Oxygen  Prescription None None      Home Resting Oxygen  Prescription None None        Goals/Expected Outcomes   Short Term Goals To learn and exhibit compliance with exercise, home and travel O2 prescription;To learn and understand importance of maintaining oxygen  saturations>88%;To learn and demonstrate proper use of respiratory medications;To learn and demonstrate proper pursed lip breathing techniques or other breathing techniques. ;To learn and understand importance of monitoring SPO2 with pulse oximeter and demonstrate accurate use of the pulse oximeter. To learn and exhibit compliance with exercise, home and travel O2 prescription;To learn and understand importance of maintaining oxygen  saturations>88%;To learn and demonstrate proper use of respiratory medications;To learn and demonstrate proper pursed lip breathing techniques or other breathing techniques. ;To learn and  understand importance of monitoring SPO2 with pulse oximeter and demonstrate accurate use of the pulse oximeter.      Long  Term Goals Verbalizes importance of monitoring SPO2 with pulse oximeter and return demonstration;Exhibits proper breathing techniques, such as pursed lip breathing or other method taught during program session;Demonstrates proper use of MDI's;Maintenance of O2 saturations>88%;Compliance with respiratory medication;Exhibits compliance with exercise, home  and travel O2 prescription Verbalizes importance of monitoring SPO2 with pulse oximeter and return demonstration;Exhibits proper breathing techniques, such as pursed lip breathing or other method taught during program session;Demonstrates proper use of MDI's;Maintenance of O2 saturations>88%;Compliance with respiratory medication;Exhibits compliance with exercise, home  and travel O2 prescription      Goals/Expected Outcomes Compliance and understanding of oxygen  saturation monitoring and breathing techniques to decrease shortness of breath. Compliance and understanding of oxygen  saturation monitoring and breathing techniques to decrease shortness of breath.         Oxygen  Discharge (Final Oxygen  Re-Evaluation):  Oxygen  Re-Evaluation - 12/01/23 0910       Program Oxygen  Prescription   Program Oxygen  Prescription None      Home Oxygen    Home Oxygen  Device None    Sleep Oxygen  Prescription None    Home Exercise Oxygen  Prescription None    Home Resting Oxygen  Prescription None      Goals/Expected Outcomes   Short Term Goals To learn and exhibit compliance with exercise, home and travel O2 prescription;To learn and understand importance of maintaining oxygen  saturations>88%;To learn and demonstrate proper use of respiratory medications;To learn and  demonstrate proper pursed lip breathing techniques or other breathing techniques. ;To learn and understand importance of monitoring SPO2 with pulse oximeter and demonstrate accurate  use of the pulse oximeter.    Long  Term Goals Verbalizes importance of monitoring SPO2 with pulse oximeter and return demonstration;Exhibits proper breathing techniques, such as pursed lip breathing or other method taught during program session;Demonstrates proper use of MDI's;Maintenance of O2 saturations>88%;Compliance with respiratory medication;Exhibits compliance with exercise, home  and travel O2 prescription    Goals/Expected Outcomes Compliance and understanding of oxygen  saturation monitoring and breathing techniques to decrease shortness of breath.          Initial Exercise Prescription:  Initial Exercise Prescription - 11/07/23 1500       Date of Initial Exercise RX and Referring Provider   Date 11/07/23    Referring Provider Dr. Praveen Mannam    Expected Discharge Date 02/14/24      Recumbant Bike   Level 1    Minutes 15    METs 1.2      NuStep   Level 1    SPM 60    Minutes 15    METs 1.4      Prescription Details   Frequency (times per week) 2    Duration Progress to 30 minutes of continuous aerobic without signs/symptoms of physical distress      Intensity   THRR 40-80% of Max Heartrate 54-108    Ratings of Perceived Exertion 11-13    Perceived Dyspnea 0-4      Progression   Progression Continue to progress workloads to maintain intensity without signs/symptoms of physical distress.      Resistance Training   Training Prescription Yes    Weight red bands    Reps 10-15          Perform Capillary Blood Glucose checks as needed.  Exercise Prescription Changes:   Exercise Prescription Changes     Row Name 11/17/23 1528 12/06/23 1500           Response to Exercise   Blood Pressure (Admit) 150/70 112/62      Blood Pressure (Exercise) 154/72 142/72      Blood Pressure (Exit) 128/74 158/80      Heart Rate (Admit) 62 bpm 75 bpm      Heart Rate (Exercise) 78 bpm 78 bpm      Heart Rate (Exit) 67 bpm 69 bpm      Oxygen  Saturation (Admit) 100 %  97 %      Oxygen  Saturation (Exercise) 98 % 96 %      Oxygen  Saturation (Exit) 94 % 100 %      Rating of Perceived Exertion (Exercise) 14 14      Perceived Dyspnea (Exercise) 1 1      Duration Continue with 30 min of aerobic exercise without signs/symptoms of physical distress. Continue with 30 min of aerobic exercise without signs/symptoms of physical distress.      Intensity THRR unchanged THRR unchanged        Progression   Progression Continue to progress workloads to maintain intensity without signs/symptoms of physical distress. Continue to progress workloads to maintain intensity without signs/symptoms of physical distress.        Resistance Training   Training Prescription Yes Yes      Weight red bands red bands      Reps 10-15 10-15      Time 10 Minutes 10 Minutes        Recumbant Bike  Level 1 1      Minutes 15 15      METs 1.7 1.8        NuStep   Level 1 1      SPM 91 87      Minutes 15 15      METs 2.2 2.2         Exercise Comments:   Exercise Comments     Row Name 11/17/23 1523           Exercise Comments Pt completed first day of group exercise. She exercised on the recumbent stepper for 15 min, level 1, METS 2.2. She then exercised on the recumbent stepper for 15 min, level 1, METs 1.7. She performed warm up and cool down standing, including squats. Using red bands. Discussed METs.          Exercise Goals and Review:   Exercise Goals     Row Name 11/07/23 1343             Exercise Goals   Increase Physical Activity Yes       Intervention Develop an individualized exercise prescription for aerobic and resistive training based on initial evaluation findings, risk stratification, comorbidities and participant's personal goals.;Provide advice, education, support and counseling about physical activity/exercise needs.       Expected Outcomes Short Term: Attend rehab on a regular basis to increase amount of physical activity.;Long Term: Exercising  regularly at least 3-5 days a week.;Long Term: Add in home exercise to make exercise part of routine and to increase amount of physical activity.       Increase Strength and Stamina Yes       Intervention Provide advice, education, support and counseling about physical activity/exercise needs.;Develop an individualized exercise prescription for aerobic and resistive training based on initial evaluation findings, risk stratification, comorbidities and participant's personal goals.       Expected Outcomes Short Term: Increase workloads from initial exercise prescription for resistance, speed, and METs.;Short Term: Perform resistance training exercises routinely during rehab and add in resistance training at home;Long Term: Improve cardiorespiratory fitness, muscular endurance and strength as measured by increased METs and functional capacity ( )       Able to understand and use rate of perceived exertion (RPE) scale Yes       Intervention Provide education and explanation on how to use RPE scale       Expected Outcomes Short Term: Able to use RPE daily in rehab to express subjective intensity level;Long Term:  Able to use RPE to guide intensity level when exercising independently       Able to understand and use Dyspnea scale Yes       Intervention Provide education and explanation on how to use Dyspnea scale       Expected Outcomes Short Term: Able to use Dyspnea scale daily in rehab to express subjective sense of shortness of breath during exertion;Long Term: Able to use Dyspnea scale to guide intensity level when exercising independently       Knowledge and understanding of Target Heart Rate Range (THRR) Yes       Intervention Provide education and explanation of THRR including how the numbers were predicted and where they are located for reference       Expected Outcomes Short Term: Able to state/look up THRR;Short Term: Able to use daily as guideline for intensity in rehab;Long Term: Able to use  THRR to govern intensity when exercising independently  Understanding of Exercise Prescription Yes       Intervention Provide education, explanation, and written materials on patient's individual exercise prescription       Expected Outcomes Short Term: Able to explain program exercise prescription;Long Term: Able to explain home exercise prescription to exercise independently          Exercise Goals Re-Evaluation :  Exercise Goals Re-Evaluation     Row Name 11/07/23 1545 12/01/23 0908           Exercise Goal Re-Evaluation   Exercise Goals Review Increase Physical Activity;Able to understand and use Dyspnea scale;Understanding of Exercise Prescription;Increase Strength and Stamina;Knowledge and understanding of Target Heart Rate Range (THRR);Able to understand and use rate of perceived exertion (RPE) scale Increase Physical Activity;Able to understand and use Dyspnea scale;Understanding of Exercise Prescription;Increase Strength and Stamina;Knowledge and understanding of Target Heart Rate Range (THRR);Able to understand and use rate of perceived exertion (RPE) scale      Comments Pt to begin exercise next week. Will monitor for progression. Pt has completed 2 exercise sessions, missing 3 for illness. She is exercising on the recumbent stepper for 15 min, level 1, METs 2.3. She then is exercising on the recumbent bike for 15 min, level 1, METs 1.8. She has not had enough time to begin progressing. She performs warm up and cool down standing with support, using red bands, 3.7 lbs. Will monitor for progression.      Expected Outcomes Through exercise at rehab and home, the patient will decrease shortness of breath with daily activities and feel confident in carrying out an exercise regimen at home. Through exercise at rehab and home, the patient will decrease shortness of breath with daily activities and feel confident in carrying out an exercise regimen at home.         Discharge Exercise  Prescription (Final Exercise Prescription Changes):  Exercise Prescription Changes - 12/06/23 1500       Response to Exercise   Blood Pressure (Admit) 112/62    Blood Pressure (Exercise) 142/72    Blood Pressure (Exit) 158/80    Heart Rate (Admit) 75 bpm    Heart Rate (Exercise) 78 bpm    Heart Rate (Exit) 69 bpm    Oxygen  Saturation (Admit) 97 %    Oxygen  Saturation (Exercise) 96 %    Oxygen  Saturation (Exit) 100 %    Rating of Perceived Exertion (Exercise) 14    Perceived Dyspnea (Exercise) 1    Duration Continue with 30 min of aerobic exercise without signs/symptoms of physical distress.    Intensity THRR unchanged      Progression   Progression Continue to progress workloads to maintain intensity without signs/symptoms of physical distress.      Resistance Training   Training Prescription Yes    Weight red bands    Reps 10-15    Time 10 Minutes      Recumbant Bike   Level 1    Minutes 15    METs 1.8      NuStep   Level 1    SPM 87    Minutes 15    METs 2.2          Nutrition:  Target Goals: Understanding of nutrition guidelines, daily intake of sodium 1500mg , cholesterol 200mg , calories 30% from fat and 7% or less from saturated fats, daily to have 5 or more servings of fruits and vegetables.  Biometrics:  Pre Biometrics - 11/07/23 1522       Pre Biometrics  Grip Strength 13 kg           Nutrition Therapy Plan and Nutrition Goals:   Nutrition Assessments:  MEDIFICTS Score Key: >=70 Need to make dietary changes  40-70 Heart Healthy Diet <= 40 Therapeutic Level Cholesterol Diet   Picture Your Plate Scores: <59 Unhealthy dietary pattern with much room for improvement. 41-50 Dietary pattern unlikely to meet recommendations for good health and room for improvement. 51-60 More healthful dietary pattern, with some room for improvement.  >60 Healthy dietary pattern, although there may be some specific behaviors that could be improved.     Nutrition Goals Re-Evaluation:   Nutrition Goals Discharge (Final Nutrition Goals Re-Evaluation):   Psychosocial: Target Goals: Acknowledge presence or absence of significant depression and/or stress, maximize coping skills, provide positive support system. Participant is able to verbalize types and ability to use techniques and skills needed for reducing stress and depression.  Initial Review & Psychosocial Screening:  Initial Psych Review & Screening - 11/07/23 1346       Initial Review   Current issues with None Identified      Family Dynamics   Good Support System? Yes      Barriers   Psychosocial barriers to participate in program There are no identifiable barriers or psychosocial needs.      Screening Interventions   Interventions Encouraged to exercise    Expected Outcomes Short Term goal: Utilizing psychosocial counselor, staff and physician to assist with identification of specific Stressors or current issues interfering with healing process. Setting desired goal for each stressor or current issue identified.;Long Term Goal: Stressors or current issues are controlled or eliminated.;Short Term goal: Identification and review with participant of any Quality of Life or Depression concerns found by scoring the questionnaire.;Long Term goal: The participant improves quality of Life and PHQ9 Scores as seen by post scores and/or verbalization of changes          Quality of Life Scores:  Scores of 19 and below usually indicate a poorer quality of life in these areas.  A difference of  2-3 points is a clinically meaningful difference.  A difference of 2-3 points in the total score of the Quality of Life Index has been associated with significant improvement in overall quality of life, self-image, physical symptoms, and general health in studies assessing change in quality of life.  PHQ-9: Review Flowsheet  More data exists      11/07/2023 03/29/2023 10/19/2022 08/30/2022  08/25/2022  Depression screen PHQ 2/9  Decreased Interest 1 2 2 3 1   Down, Depressed, Hopeless 0 1 1 1 1   PHQ - 2 Score 1 3 3 4 2   Altered sleeping 1 2 3 2 1   Tired, decreased energy 1 2 3 3 1   Change in appetite 0 2 2 1  0  Feeling bad or failure about yourself  0 0 0 0 1  Trouble concentrating 0 1 1 1  0  Moving slowly or fidgety/restless 0 0 0 0 1  Suicidal thoughts 0 0 0 0 0  PHQ-9 Score 3 10 12 11 6   Difficult doing work/chores Somewhat difficult Somewhat difficult Somewhat difficult Somewhat difficult Somewhat difficult   Interpretation of Total Score  Total Score Depression Severity:  1-4 = Minimal depression, 5-9 = Mild depression, 10-14 = Moderate depression, 15-19 = Moderately severe depression, 20-27 = Severe depression   Psychosocial Evaluation and Intervention:  Psychosocial Evaluation - 11/07/23 1348       Psychosocial Evaluation & Interventions   Interventions  Encouraged to exercise with the program and follow exercise prescription    Comments Pt denies any psy/soc barriers. Stated that recently her freezer stopped working and she is upset to have lost her frozen food. Stated she is working on getting it fixed. Pt stated she has great support from her daughters and eldest granddaughter. Denies any additional needs or resources at this time.    Expected Outcomes For Kaydee to participate in PR free of any psy/soc barriers or conerns. To get her freezer fixed    Continue Psychosocial Services  No Follow up required          Psychosocial Re-Evaluation:  Psychosocial Re-Evaluation     Row Name 11/09/23 0929 11/28/23 1222           Psychosocial Re-Evaluation   Current issues with None Identified None Identified      Comments Psychosocial monthly re-evaluation is as follows: No changes since orientation. Jen is scheduled to start the program next week. She declined any referrals or additional resources at that time. Monthly psychosocial re-evaluation as follows: Pt  denies any psy/soc barriers. Raiden got her freezer fixed. She declined needing any resources to replace the food that had spoiled. Pt was recently hospitalized for a blood clot. She is now taking a blood thinner. This did not stop Karinna. She has come back to class and has been exercising to increase her strength and stamina. She stated she has great support from her daughters and eldest granddaughter. Tiffaney denies any additional needs or resources at this time.      Expected Outcomes For Carleah to participate in PR free of any psy/soc barriers or conerns. For Ziyan to participate in PR free of any psy/soc barriers or conerns.      Interventions Encouraged to attend Pulmonary Rehabilitation for the exercise Encouraged to attend Pulmonary Rehabilitation for the exercise      Continue Psychosocial Services  No Follow up required No Follow up required         Psychosocial Discharge (Final Psychosocial Re-Evaluation):  Psychosocial Re-Evaluation - 11/28/23 1222       Psychosocial Re-Evaluation   Current issues with None Identified    Comments Monthly psychosocial re-evaluation as follows: Pt denies any psy/soc barriers. Zully got her freezer fixed. She declined needing any resources to replace the food that had spoiled. Pt was recently hospitalized for a blood clot. She is now taking a blood thinner. This did not stop Amar. She has come back to class and has been exercising to increase her strength and stamina. She stated she has great support from her daughters and eldest granddaughter. Malashia denies any additional needs or resources at this time.    Expected Outcomes For Amneet to participate in PR free of any psy/soc barriers or conerns.    Interventions Encouraged to attend Pulmonary Rehabilitation for the exercise    Continue Psychosocial Services  No Follow up required          Education: Education Goals: Education classes will be provided on a weekly basis, covering required topics. Participant will state  understanding/return demonstration of topics presented.  Learning Barriers/Preferences:  Learning Barriers/Preferences - 11/07/23 1348       Learning Barriers/Preferences   Learning Barriers None    Learning Preferences None          Education Topics: Know Your Numbers Group instruction that is supported by a PowerPoint presentation. Instructor discusses importance of knowing and understanding resting, exercise, and post-exercise oxygen  saturation, heart rate,  and blood pressure. Oxygen  saturation, heart rate, blood pressure, rating of perceived exertion, and dyspnea are reviewed along with a normal range for these values.    Exercise for the Pulmonary Patient Group instruction that is supported by a PowerPoint presentation. Instructor discusses benefits of exercise, core components of exercise, frequency, duration, and intensity of an exercise routine, importance of utilizing pulse oximetry during exercise, safety while exercising, and options of places to exercise outside of rehab.    MET Level  Group instruction provided by PowerPoint, verbal discussion, and written material to support subject matter. Instructor reviews what METs are and how to increase METs.    Pulmonary Medications Verbally interactive group education provided by instructor with focus on inhaled medications and proper administration.   Anatomy and Physiology of the Respiratory System Group instruction provided by PowerPoint, verbal discussion, and written material to support subject matter. Instructor reviews respiratory cycle and anatomical components of the respiratory system and their functions. Instructor also reviews differences in obstructive and restrictive respiratory diseases with examples of each.    Oxygen  Safety Group instruction provided by PowerPoint, verbal discussion, and written material to support subject matter. There is an overview of "What is Oxygen " and "Why do we need it".  Instructor  also reviews how to create a safe environment for oxygen  use, the importance of using oxygen  as prescribed, and the risks of noncompliance. There is a brief discussion on traveling with oxygen  and resources the patient may utilize.   Oxygen  Use Group instruction provided by PowerPoint, verbal discussion, and written material to discuss how supplemental oxygen  is prescribed and different types of oxygen  supply systems. Resources for more information are provided.    Breathing Techniques Group instruction that is supported by demonstration and informational handouts. Instructor discusses the benefits of pursed lip and diaphragmatic breathing and detailed demonstration on how to perform both.  Flowsheet Row PULMONARY REHAB CHRONIC OBSTRUCTIVE PULMONARY DISEASE from 11/17/2023 in [facility] for [facility], V[facility], [facility]  Date 11/17/23  Educator RN  Instruction Review Code 1- Verbalizes Understanding     Risk Factor Reduction Group instruction that is supported by a PowerPoint presentation. Instructor discusses the definition of a risk factor, different risk factors for pulmonary disease, and how the heart and lungs work together.   Pulmonary Diseases Group instruction provided by PowerPoint, verbal discussion, and written material to support subject matter. Instructor gives an overview of the different type of pulmonary diseases. There is also a discussion on risk factors and symptoms as well as ways to manage the diseases.   Stress and Energy Conservation Group instruction provided by PowerPoint, verbal discussion, and written material to support subject matter. Instructor gives an overview of stress and the impact it can have on the body. Instructor also reviews ways to reduce stress. There is also a discussion on energy conservation and ways to conserve energy throughout the day. Flowsheet Row PULMONARY REHAB CHRONIC OBSTRUCTIVE PULMONARY DISEASE from 11/24/2023  in [facility] for Heart, Vascular, & Lung Health  Date 11/24/23  Educator RN  Instruction Review Code 1- Verbalizes Understanding    Warning Signs and Symptoms Group instruction provided by PowerPoint, verbal discussion, and written material to support subject matter. Instructor reviews warning signs and symptoms of stroke, heart attack, cold and flu. Instructor also reviews ways to prevent the spread of infection.   Other Education Group or individual verbal, written, or video instructions that support the educational goals of the pulmonary rehab  program.    Knowledge Questionnaire Score:  Knowledge Questionnaire Score - 11/07/23 1458       Knowledge Questionnaire Score   Pre Score 17/18          Core Components/Risk Factors/Patient Goals at Admission:  Personal Goals and Risk Factors at Admission - 11/07/23 1525       Core Components/Risk Factors/Patient Goals on Admission   Improve shortness of breath with ADL's Yes    Intervention Provide education, individualized exercise plan and daily activity instruction to help decrease symptoms of SOB with activities of daily living.    Expected Outcomes Short Term: Improve cardiorespiratory fitness to achieve a reduction of symptoms when performing ADLs;Long Term: Be able to perform more ADLs without symptoms or delay the onset of symptoms          Core Components/Risk Factors/Patient Goals Review:   Goals and Risk Factor Review     Row Name 11/09/23 0931 11/28/23 1224           Core Components/Risk Factors/Patient Goals Review   Personal Goals Review Improve shortness of breath with ADL's;Develop more efficient breathing techniques such as purse lipped breathing and diaphragmatic breathing and practicing self-pacing with activity. Improve shortness of breath with ADL's;Develop more efficient breathing techniques such as purse lipped breathing and diaphragmatic breathing and practicing self-pacing  with activity.      Review Monthly review of patient's Core Components/Risk Factors/Patient Goals are as follows: Unable to assess goals yet. Chasitie is scheduled to start the program next week. Monthly review of patient's Core Components/Risk Factors/Patient Goals are as follows: Goal progressing for improving shortness of breath with ADL's. Edyn is currently exercising on room air with exertion to maintain sats >88%. She has completed 2 sessions so far and is exercising and the seated bike. Goal progressing for developing more efficient breathing techniques such as purse lipped breathing and diaphragmatic breathing; and practicing self-pacing with activity. Venise is working on purse lipped breathing while short of breath. She needs to be prompted while performing the warmup and while exercising. She has begun to work on diaphragmatic breathing at home. Chaitra will continue to benefit from PR for nutrition, education, exercise, and lifestyle modification.      Expected Outcomes Pt will show progress toward meeting expected goals and outcomes. Pt will show progress toward meeting expected goals and outcomes.         Core Components/Risk Factors/Patient Goals at Discharge (Final Review):   Goals and Risk Factor Review - 11/28/23 1224       Core Components/Risk Factors/Patient Goals Review   Personal Goals Review Improve shortness of breath with ADL's;Develop more efficient breathing techniques such as purse lipped breathing and diaphragmatic breathing and practicing self-pacing with activity.    Review Monthly review of patient's Core Components/Risk Factors/Patient Goals are as follows: Goal progressing for improving shortness of breath with ADL's. Saniyah is currently exercising on room air with exertion to maintain sats >88%. She has completed 2 sessions so far and is exercising and the seated bike. Goal progressing for developing more efficient breathing techniques such as purse lipped breathing and diaphragmatic  breathing; and practicing self-pacing with activity. Rayah is working on purse lipped breathing while short of breath. She needs to be prompted while performing the warmup and while exercising. She has begun to work on diaphragmatic breathing at home. Alliah will continue to benefit from PR for nutrition, education, exercise, and lifestyle modification.    Expected Outcomes Pt will show progress toward meeting  expected goals and outcomes.          ITP Comments: Pt is making expected progress toward Pulmonary Rehab goals after completing 3 session(s). Recommend continued exercise, life style modification, education, and utilization of breathing techniques to increase stamina and strength, while also decreasing shortness of breath with exertion.    Comments: Dr. Slater Staff is Medical Director for Pulmonary Rehab at [facility].

## 2023-12-08 ENCOUNTER — Encounter (HOSPITAL_COMMUNITY)
Admission: RE | Admit: 2023-12-08 | Discharge: 2023-12-08 | Disposition: A | Discharge status: Home / Self Care | Source: Ambulatory Visit | Attending: Pulmonary Disease | Admitting: Pulmonary Disease

## 2023-12-08 DIAGNOSIS — J449 Chronic obstructive pulmonary disease, unspecified: Secondary | ICD-10-CM

## 2023-12-08 NOTE — Progress Notes (Signed)
 Daily Session Note  Patient Details  Name: [name] MRN: [id number] Date of Birth: 19-Oct-1940 Referring Provider:   Conrad Ports Pulmonary Rehab Walk Test from 11/07/2023 in [facility] for Heart, Vascular, & Lung Health  Referring Provider Dr. Lonna Coder    Encounter Date: 12/08/2023  Check In:  Session Check In - 12/08/23 1340       Check-In   Supervising physician immediately available to respond to emergencies [facility] MD immediately available    Physician(s) Rosaline Bane, NP    Location [facility]    Staff Present Ronal Levin, RN, BSN;Alias Villagran Claudene, RT;Randi Barada BS, ACSM-CEP, Exercise Physiologist;Kaylee Nicholaus, MS, ACSM-CEP, Exercise Physiologist    Virtual Visit No    Medication changes reported     No    Fall or balance concerns reported    Yes    Comments unstable at times, dizziness, uses can when dizzy    Tobacco Cessation No Change    Warm-up and Cool-down Performed as group-led instruction   only   Resistance Training Performed Yes    VAD Patient? No    PAD/SET Patient? No      Pain Assessment   Currently in Pain? No/denies    Multiple Pain Sites No          Capillary Blood Glucose: No results found. However, due to the size of the patient record, not all encounters were searched. Please check Results Review for a complete set of results.    Social History   Tobacco Use  Smoking Status Former   Current packs/day: 0.00   Average packs/day: 0.5 packs/day for 21.0 years (10.5 ttl pk-yrs)   Types: Cigarettes   Start date: 02/09/1955   Quit date: 02/09/1976   Years since quitting: 47.8  Smokeless Tobacco Never    Goals Met:  Proper associated with RPD/PD & O2 Sat Independence with exercise equipment Exercise tolerated well No report of concerns or symptoms today Strength training completed today  Goals Unmet:  Not Applicable  Comments: Service time is from 1311 to 1400.    Dr. Slater Staff  is Medical Director for Pulmonary Rehab at [facility].

## 2023-12-09 ENCOUNTER — Ambulatory Visit

## 2023-12-13 ENCOUNTER — Encounter (HOSPITAL_COMMUNITY)
Admission: RE | Admit: 2023-12-13 | Discharge: 2023-12-13 | Disposition: A | Discharge status: Home / Self Care | Source: Ambulatory Visit | Attending: Pulmonary Disease | Admitting: Pulmonary Disease

## 2023-12-13 DIAGNOSIS — J449 Chronic obstructive pulmonary disease, unspecified: Secondary | ICD-10-CM | POA: Diagnosis present

## 2023-12-13 NOTE — Progress Notes (Deleted)
       Cardiology Office Note Date:  12/13/2023  ID:  [name], DOB Jul 17, 1940, MRN [id number] PCP:  Geofm Glade PARAS, MD  Cardiologist:  PIERRETTE Joelle VEAR [name], MD  No chief complaint on file.   Problems Dyspnea CAD s/p CABG x 3 (LIMA-LAD, SVG-RCA, SVG-Diag) 2009 RHC 11/2023- RA 3, RV 56/3, PA 52/14 (31), PCWP 12, LVEDP 11, TPG 24, PVR 4.2 LHC 11/2023- stable from prior [facility] 07/2021- prox LAD 60%, prox RCA 25%, LIMA/SVG to D1 patent, SVG-PDA occluded TTE 04/2023- 60-65%, mild LVH, GI1DD, severe MAC Event Monitor 02/2023 for 7 days normal On ASA81, XL100, and Imdur  60 mg BID HFpEF on FE20 and SE25 daily ED for volume overload in 02/2023 OSA not on CPAP, severe COPD Statin intolerance Uncontrolled IDDM HTN/HLD  Visits  03/25: ordered TTE for dyspnea --> 60-65%, G1DD, severe MAC and referred to pulm who started neb and recommended pulm rehab; Per pulm, dyspnea likely due to severe COPD-asthma and not being on CPAP.  09/25: LHC/RHC, DVT US , LP, start ezetimibe , d/c diltiazem  given bradycardia--> DVT in right leg, so started Eliquis; stable CAD with moderate pulmonary HTN; started pulmonary rehab.  History of Present Illness: [name] is a 83 y.o. female who presents for follow up.   ROS: Otherwise negative  Physical Exam VS:  LMP  (LMP Unknown)  , BMI There is no height or weight on file to calculate BMI. GEN: Well nourished, well developed, in no acute distress HEENT: normal Neck: no JVD, carotid bruits, or masses Cardiac: ***RRR; no murmurs, rubs, or gallops,no edema  Respiratory:  CTAB bilaterally, normal work of breathing GI: soft, nontender, nondistended, + BS Extremities: No LE edema Skin: warm and dry, no rash Neuro:  Strength and sensation are intact  Recent Labs: Reviewed  ASSESSMENT AND PLAN [name] is a 83 y.o. female who presents for follow up.    Signed, Joelle VEAR [name], MD  12/13/2023 9:06 AM    [facility]

## 2023-12-13 NOTE — Progress Notes (Signed)
 Daily Session Note  Patient Details  Name: [name] MRN: [id number] Date of Birth: 04-12-1940 Referring Provider:   Conrad Ports Pulmonary Rehab Walk Test from 11/07/2023 in [facility] for Heart, Vascular, & Lung Health  Referring Provider Dr. Lonna Coder    Encounter Date: 12/13/2023  Check In:  Session Check In - 12/13/23 1329       Check-In   Supervising physician immediately available to respond to emergencies [facility] MD immediately available    Physician(s) Rosaline Bane, NP    [facility]    Staff Present Ronal Levin, RN, BSN;Casey Claudene, RT;Avamae Dehaan [place] BS, ACSM-CEP, Exercise Physiologist;Kaylee Nicholaus, MS, ACSM-CEP, Exercise Physiologist    Virtual Visit No    Medication changes reported     No    Fall or balance concerns reported    Yes    Comments unstable at times, dizziness, uses can when dizzy    Tobacco Cessation No Change    Warm-up and Cool-down Performed as group-led instruction   only   Resistance Training Performed Yes    VAD Patient? No    PAD/SET Patient? No      Pain Assessment   Currently in Pain? No/denies    Multiple Pain Sites No          Capillary Blood Glucose: No results found. However, due to the size of the patient record, not all encounters were searched. Please check Results Review for a complete set of results.    Social History   Tobacco Use  Smoking Status Former   Current packs/day: 0.00   Average packs/day: 0.5 packs/day for 21.0 years (10.5 ttl pk-yrs)   Types: Cigarettes   Start date: 02/09/1955   Quit date: 02/09/1976   Years since quitting: 47.8  Smokeless Tobacco Never    Goals Met:  Exercise tolerated well No report of concerns or symptoms today Strength training completed today  Goals Unmet:  Not Applicable  Comments: Service time is from 1303 to 1445.    Dr. Slater Staff is Medical Director for Pulmonary Rehab at [facility].

## 2023-12-14 LAB — GLUCOSE, CAPILLARY: Glucose-Capillary: 158 mg/dL — ABNORMAL HIGH (ref 70–99)

## 2023-12-15 ENCOUNTER — Encounter (HOSPITAL_COMMUNITY)
Admission: RE | Admit: 2023-12-15 | Discharge: 2023-12-15 | Disposition: A | Discharge status: Home / Self Care | Source: Ambulatory Visit | Attending: Pulmonary Disease | Admitting: Pulmonary Disease

## 2023-12-15 DIAGNOSIS — J449 Chronic obstructive pulmonary disease, unspecified: Secondary | ICD-10-CM

## 2023-12-15 LAB — GLUCOSE, CAPILLARY
Glucose-Capillary: 174 mg/dL — ABNORMAL HIGH (ref 70–99)
Glucose-Capillary: 140 mg/dL — ABNORMAL HIGH (ref 70–99)

## 2023-12-15 NOTE — Progress Notes (Signed)
 Daily Session Note  Patient Details  Name: [name] MRN: [id number] Date of Birth: 15-May-1940 Referring Provider:   Conrad Ports Pulmonary Rehab Walk Test from 11/07/2023 in [facility] for Heart, Vascular, & Lung Health  Referring Provider Dr. Lonna Coder    Encounter Date: 12/15/2023  Check In:  Session Check In - 12/15/23 1326       Check-In   Supervising physician immediately available to respond to emergencies [facility] MD immediately available    Physician(s) Orren Fabry, NP    [facility]    Staff Present Ronal Levin, RN, BSN;Casey Claudene Neita Moats, MS, ACSM-CEP, Exercise Physiologist    Virtual Visit No    Medication changes reported     No    Fall or balance concerns reported    Yes    Comments unstable at times, dizziness, uses can when dizzy    Tobacco Cessation No Change    Warm-up and Cool-down Performed as group-led instruction   only   Resistance Training Performed Yes    VAD Patient? No    PAD/SET Patient? No      Pain Assessment   Currently in Pain? No/denies    Multiple Pain Sites No          Capillary Blood Glucose: No results found. However, due to the size of the patient record, not all encounters were searched. Please check Results Review for a complete set of results.    Social History   Tobacco Use  Smoking Status Former   Current packs/day: 0.00   Average packs/day: 0.5 packs/day for 21.0 years (10.5 ttl pk-yrs)   Types: Cigarettes   Start date: 02/09/1955   Quit date: 02/09/1976   Years since quitting: 47.8  Smokeless Tobacco Never    Goals Met:  Exercise tolerated well Queuing for purse lip breathing No report of concerns or symptoms today Strength training completed today  Goals Unmet:  Not Applicable  Comments: Service time is from 1317 to 1429    Dr. Slater Staff is Medical Director for Pulmonary Rehab at [facility].

## 2023-12-16 ENCOUNTER — Ambulatory Visit

## 2023-12-16 DIAGNOSIS — Z951 Presence of aortocoronary bypass graft: Secondary | ICD-10-CM

## 2023-12-16 DIAGNOSIS — Z789 Other specified health status: Secondary | ICD-10-CM

## 2023-12-16 DIAGNOSIS — E1169 Type 2 diabetes mellitus with other specified complication: Secondary | ICD-10-CM

## 2023-12-16 DIAGNOSIS — I10 Essential (primary) hypertension: Secondary | ICD-10-CM

## 2023-12-16 DIAGNOSIS — E7800 Pure hypercholesterolemia, unspecified: Secondary | ICD-10-CM

## 2023-12-16 DIAGNOSIS — E669 Obesity, unspecified: Secondary | ICD-10-CM

## 2023-12-16 DIAGNOSIS — I5032 Chronic diastolic (congestive) heart failure: Secondary | ICD-10-CM

## 2023-12-16 DIAGNOSIS — I824Y1 Acute embolism and thrombosis of unspecified deep veins of right proximal lower extremity: Secondary | ICD-10-CM

## 2023-12-16 DIAGNOSIS — J4489 Other specified chronic obstructive pulmonary disease: Secondary | ICD-10-CM

## 2023-12-19 ENCOUNTER — Other Ambulatory Visit: Payer: Self-pay

## 2023-12-19 ENCOUNTER — Ambulatory Visit

## 2023-12-19 DIAGNOSIS — M6281 Muscle weakness (generalized): Secondary | ICD-10-CM | POA: Diagnosis present

## 2023-12-19 DIAGNOSIS — M79605 Pain in left leg: Secondary | ICD-10-CM | POA: Insufficient documentation

## 2023-12-19 NOTE — Therapy (Signed)
 OUTPATIENT PHYSICAL THERAPY LOWER EXTREMITY EVALUATION   Patient Name: [name] MRN: [id number] DOB:[birth year], 83 y.o., female Today's Date: 12/19/2023  END OF SESSION:  PT End of Session - 12/19/23 2008     Visit Number 1    Number of Visits 16    Date for Recertification  02/18/24    Authorization Type [employer] Medicare    PT Start Time 1315    PT Stop Time 1400    PT Time Calculation (min) 45 min          Past Medical History:  Diagnosis Date   Adenomatous colon polyp    Allergy     Anxiety    Arthritis    Asthma    Carotid artery disease    carotid US  02/2017: bilat ICA 1-39%   Chronic diastolic CHF    Echo 02/2017: EF 65-70, Gr 2 DD, mild MS (mean 5), PASP 44   COPD    pt is unsure if has been officially diagnosed   Coronary artery disease    CABG '09- cathed 12/09, 9/10, 6/11, 3/14 and 12/13/16- medical Rx // cath 12/2016 - 2/3 grafts patent >> med Rx // Myoview  12/17: low risk   Diabetes mellitus    Dyspnea    with exertion   Gastroesophageal reflux disease    Headache    Hiatal hernia    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   Hyperlipidemia    Hypertension    Myocardial infarction (HCC)    PONV (postoperative nausea and vomiting)    Schatzki's ring    Shoulder injury    resolved after shoulder surgery   Sleep apnea    not on cpap   Past Surgical History:  Procedure Laterality Date   ABDOMINAL HYSTERECTOMY     APPENDECTOMY     came out with Hysterectomy   CARDIAC CATHETERIZATION  07/23/2009   EF 60%   CARDIAC CATHETERIZATION  10/11/2008   CARDIAC CATHETERIZATION  03/01/2007   EF 75-80%   CARDIOVASCULAR STRESS TEST  11/15/2007   EF 60%   COLONOSCOPY     CORONARY ARTERY BYPASS GRAFT     SEVERELY DISEASED SAPHENOUS VEIN GRAFT TO THE RIGHT CORONARY ARTERY BUT WITH FAIRLY WELL PRESERVED FLOW TO THE DISTAL RIGHT CORONARY ARTERY FROM THE NATIVE CIRCULATION-RESTART  CATH IN JUNE 2000, REVEALS MILD/MODERATE  CAD WITH GOOD FLOW DOWN HER LAD    ESOPHAGOGASTRODUODENOSCOPY     EYE SURGERY     bilateral cataract surgery with lens implant   LEFT HEART CATH AND CORS/GRAFTS ANGIOGRAPHY N/A 12/14/2016   Procedure: LEFT HEART CATH AND CORS/GRAFTS ANGIOGRAPHY;  Surgeon: Dann Candyce RAMAN, MD;  Location: [facility];  Service: Cardiovascular;  Laterality: N/A;   lense removal Left    POLYPECTOMY     REVERSE SHOULDER ARTHROPLASTY Left 06/25/2022   Procedure: REVERSE SHOULDER ARTHROPLASTY;  Surgeon: Kay Kemps, MD;  Location: [facility];  Service: Orthopedics;  Laterality: Left;  general and choice with interscalene block   RIGHT/LEFT HEART CATH AND CORONARY/GRAFT ANGIOGRAPHY N/A 07/28/2021   Procedure: RIGHT/LEFT HEART CATH AND CORONARY/GRAFT ANGIOGRAPHY;  Surgeon: Burnard Debby LABOR, MD;  Location: [facility];  Service: Cardiovascular;  Laterality: N/A;   RIGHT/LEFT HEART CATH AND CORONARY/GRAFT ANGIOGRAPHY N/A 11/11/2023   Procedure: RIGHT/LEFT HEART CATH AND CORONARY/GRAFT ANGIOGRAPHY;  Surgeon: Wonda Sharper, MD;  Location: [facility];  Service: Cardiovascular;  Laterality: N/A;   ROTATOR CUFF REPAIR     right and  left   TONSILLECTOMY     age 89   TOTAL KNEE ARTHROPLASTY Right 02/20/2014   Procedure: RIGHT TOTAL KNEE ARTHROPLASTY;  Surgeon: Tanda DELENA Heading, MD;  Location: [facility];  Service: Orthopedics;  Laterality: Right;   tumor removed kidney     UPPER GASTROINTESTINAL ENDOSCOPY     US  ECHOCARDIOGRAPHY  03/08/2008   EF 55-60%   Patient Active Problem List   Diagnosis Date Noted   Head contusion 09/08/2023   Hypomagnesemia 09/20/2022   Hypoxia 09/09/2022   H/O total shoulder replacement, left 06/25/2022   IBS (irritable bowel syndrome) 06/02/2022   Diarrhea 04/07/2022   Hypoglycemia 12/20/2021   Chronic anticoagulation 12/20/2021   Syncope 12/20/2021   Cirrhosis (HCC) 12/20/2021   Left sided numbness 11/09/2021   Rib pain on left side 11/09/2021   Labile blood glucose 09/18/2021   Shortness of breath     Bilateral ankle pain 11/11/2020   Anxiety 09/09/2020   Coronary artery disease    Pelvic pain 10/30/2019   Chest pain at rest 08/24/2018   Arthralgia 08/01/2017   COPD with asthma (HCC) 05/04/2017   Insomnia 11/23/2016   Chronic nonintractable headache 11/23/2016   Pancreatic insufficiency 10/28/2016   Carotid artery disease 05/19/2016   Fatigue 05/19/2016   Anemia 03/27/2016   Type 1 diabetes mellitus (HCC) 07/21/2015   B12 deficiency-monthly B12 injections 03/31/2015   OSA (obstructive sleep apnea) with hypoxia 11/20/2014   Fecal incontinence 08/28/2014   Neurologic gait dysfunction 08/28/2014   Falls 08/28/2014   Neck pain of over 3 months duration 08/28/2014   H/O total knee replacement 02/20/2014   Obesity (BMI 30-39.9) 11/12/2013   Chronic diastolic CHF (congestive heart failure) (HCC) 11/17/2012   Meniscus, lateral, anterior horn derangement 11/10/2011   Medial meniscus, posterior horn derangement 11/10/2011   Osteoarthritis of right knee 11/10/2011   Dizziness 10/05/2011   MUSCLE CRAMPS 12/29/2009   History of myocardial infarction 11/19/2009   Extrinsic asthma 11/19/2009   GERD 11/19/2009   Cough 11/19/2009   Hyperlipidemia 11/18/2009    PCP: Geofm Glade PARAS, MD PCP - General   REFERRING PROVIDER: Teresa Rankin ORN, MD Ref Provider   REFERRING DIAG: [phone] (ICD-10-CM) - Left thigh pain   THERAPY DIAG:  Pain in left leg  Muscle weakness (generalized)  Rationale for Evaluation and Treatment: rehabilitation  ONSET DATE: 3 weeks ago  SUBJECTIVE:   SUBJECTIVE STATEMENT:   Pain started three weeks ago; woke up one day and left thigh was hurting.Got an injection and two days later pain was the same. Biking helps it out but it comes back at night with a vengeance.  PERTINENT HISTORY: Diabetic, hyperlipidemia, history of MI, hx of muscle cramps, CHF, obesity, CAD, PAIN:  Are you having pain? Yes: NPRS scale: 10 Pain location: (TFL, quad, hamstring, calf,  adductor)  Pain description: sharp (constantly) Aggravating factors: walking, flipping over in bed Relieving factors: nothing  PRECAUTIONS: None  RED FLAGS: None   WEIGHT BEARING RESTRICTIONS: No  FALLS:  Has patient fallen in last 6 months? No  OCCUPATION: retired  PLOF: Independent  PATIENT GOALS: decrease pain, improve adl completion  OBJECTIVE:  Note: Objective measures were completed at Evaluation unless otherwise noted.   PATIENT SURVEYS:  LEFS  Extreme difficulty/unable (0), Quite a bit of difficulty (1), Moderate difficulty (2), Little difficulty (3), No difficulty (4) Survey date:    Any of your usual work, housework or school activities   2. Usual hobbies, recreational or sporting activities   3.  Getting into/out of the bath   4. Walking between rooms   5. Putting on socks/shoes   6. Squatting    7. Lifting an object, like a bag of groceries from the floor   8. Performing light activities around your home   9. Performing heavy activities around your home   10. Getting into/out of a car   11. Walking 2 blocks   12. Walking 1 mile   13. Going up/down 10 stairs (1 flight)   14. Standing for 1 hour   15.  sitting for 1 hour   16. Running on even ground   17. Running on uneven ground   18. Making sharp turns while running fast   19. Hopping    20. Rolling over in bed   Score total:  19/80     COGNITION: Overall cognitive status: Within functional limits for tasks assessed     SENSATION: WFL   POSTURE: rounded shoulders and forward head  PALPATION: TTP L quad, adductor, hamstring, hamstring  LOWER EXTREMITY ROM:  Active ROM Right eval Left eval  Hip flexion [facility]  Hip extension    Hip abduction WFL wfl  Hip adduction wfl wfl  Hip internal rotation    Hip external rotation    Knee flexion wfl wfl  Knee extension wfl wfl  Ankle dorsiflexion    Ankle plantarflexion    Ankle inversion    Ankle eversion     (Blank rows = not  tested)  LOWER EXTREMITY MMT:  MMT Right eval Left eval  Hip flexion 4- 3+ p!  Hip extension    Hip abduction 4- 3+ p!  Hip adduction 3+ p! 3+ p!  Hip internal rotation    Hip external rotation    Knee flexion 4- 3+ p!  Knee extension 4+ 3+ p!  Ankle dorsiflexion    Ankle plantarflexion    Ankle inversion    Ankle eversion     (Blank rows = not tested)                                                                                                                                     TREATMENT DATE:   TREATMENT 12/19/2023:  Therapeutic Exercise: Seated hip abduction x4x3s Seated LAQ x4x3s Seated heel slide x4x3s    Self-care/Home Management: Patient educated on HEP, POC, prognosis, and relevant tissues/anatomy.     PATIENT EDUCATION:  Education details: HEP Person educated: Patient Education method: Solicitor, Actor cues, Verbal cues, and Handouts Education comprehension: verbalized understanding and returned demonstration  HOME EXERCISE PROGRAM: 5x/wk, 2x/day Seated hip abduction x4x3s Seated LAQ x4x3s Seated heel slide x4x3s  ASSESSMENT:  CLINICAL IMPRESSION: EVAL: Patient is a 83 year old female who presents with L thigh pain with insidious onset due to suspected muscle detraining. Patient presents with deficits in: excessive pain, L LE strength, and overall activity tolerance. As a result, the patient  would benefit from skilled PT to address aforementioned deficits via plan below.   OBJECTIVE IMPAIRMENTS: decreased strength, obesity, and pain.   ACTIVITY LIMITATIONS: carrying, lifting, bending, standing, squatting, and stairs  PERSONAL FACTORS: Age, Fitness, Past/current experiences, Time since onset of injury/illness/exacerbation, and 3+ comorbidities:   are also affecting patient's functional outcome.   REHAB POTENTIAL: Fair    CLINICAL DECISION MAKING: Evolving/moderate complexity  EVALUATION COMPLEXITY:  High   GOALS: Goals reviewed with patient? No  SHORT TERM GOALS: Target date: 01/09/2024   1) Patient will demonstrate 75% HEP compliance to show independence with self-management of condition   Baseline: 0% Goal status: INITIAL  2) Patient will decrease worst pain to 8 at most to improve ADL completion and overall QOL   Baseline: 10 Goal status: INITIAL    LONG TERM GOALS: Target date: 02/18/24   1) Patient will demonstrate 100% HEP compliance to show independence with self-management of condition   Baseline: 0% Goal status: INITIAL  2) Patient will decrease worst pain to 6 at most to improve ADL completion and overall QOL   Baseline: 10 Goal status: INITIAL  3) Patient will demonstrate a 9 point improvement in LEFS to show improvements in ADL completion and overall QOL    Baseline: 19/80 Goal status: INITIAL     PLAN:  PT FREQUENCY: 1-2x/week  PT DURATION: 8 weeks  PLANNED INTERVENTIONS: 97110-Therapeutic exercises, 97530- Therapeutic activity, 97112- Neuromuscular re-education, 97535- Self Care, 02859- Manual therapy, and Patient/Family education  PLAN FOR NEXT SESSION: HEP assessment and progression, symptom modulation, and loading (isolated and/or functional). Manual therapy, aerobic, gait, and NME training as needed. Progressive loading of L hips, thigh, and calf.    Washington Greener Silena Wyss  PT, DPT  12/19/2023, 8:21 PM  For all possible CPT codes, reference the Planned Interventions line above.     Check all conditions that are expected to impact treatment: {Conditions expected to impact treatment:Diabetes mellitus and Active major medical illness   If treatment provided at initial evaluation, no treatment charged due to lack of authorization.

## 2023-12-20 ENCOUNTER — Encounter (HOSPITAL_COMMUNITY)
Admission: RE | Admit: 2023-12-20 | Discharge: 2023-12-20 | Disposition: A | Discharge status: Home / Self Care | Source: Ambulatory Visit | Attending: Pulmonary Disease | Admitting: Pulmonary Disease

## 2023-12-20 VITALS — Wt 174.6 lb

## 2023-12-20 DIAGNOSIS — J449 Chronic obstructive pulmonary disease, unspecified: Secondary | ICD-10-CM

## 2023-12-20 LAB — GLUCOSE, CAPILLARY: Glucose-Capillary: 84 mg/dL (ref 70–99)

## 2023-12-20 NOTE — Progress Notes (Signed)
 Daily Session Note  Patient Details  Name: LINA HITCH MRN: [id number] Date of Birth: 1940/10/03 Referring Provider:   Conrad Ports Pulmonary Rehab Walk Test from 11/07/2023 in [facility]  Referring Provider Dr. Lonna Coder    Encounter Date: 12/20/2023  Check In:  Session Check In - 12/20/23 1353       Check-In   Supervising physician immediately available to respond to emergencies [facility] MD immediately available    Physician(s) Barnie Press, NP    Location [facility]    Staff Present Ronal Levin, RN, BSN;Darren Caldron, Neita Moats, MS, ACSM-CEP, Exercise Physiologist;Randi Midge BS, ACSM-CEP, Exercise Physiologist;Annedrea Violetta, RN, [place]    Virtual Visit No    Medication changes reported     No    Fall or balance concerns reported    Yes    Comments unstable at times, dizziness, uses can when dizzy    Tobacco Cessation No Change    Warm-up and Cool-down Performed as group-led instruction   only   Resistance Training Performed Yes    VAD Patient? No    PAD/SET Patient? No      Pain Assessment   Currently in Pain? No/denies    Multiple Pain Sites No          Capillary Blood Glucose: Results for orders placed or performed during the hospital encounter of 12/20/23 (from the past 24 hours)  Glucose, capillary     Status: None   Collection Time: 12/20/23  2:43 PM  Result Value Ref Range   Glucose-Capillary 84 70 - 99 mg/dL   *Note: Due to a large number of results and/or encounters for the requested time period, some results have not been displayed. A complete set of results can be found in Results Review.     Exercise Prescription Changes - 12/20/23 1500       Response to Exercise   Blood Pressure (Admit) 132/68    Blood Pressure (Exercise) 142/64    Blood Pressure (Exit) 120/60    Heart Rate (Admit) 66 bpm    Heart Rate (Exercise) 66 bpm    Heart Rate (Exit) 64  bpm    Oxygen  Saturation (Admit) 100 %    Oxygen  Saturation (Exercise) 94 %    Oxygen  Saturation (Exit) 97 %    Rating of Perceived Exertion (Exercise) 10    Perceived Dyspnea (Exercise) 1    Duration Continue with 30 min of aerobic exercise without signs/symptoms of physical distress.    Intensity THRR unchanged      Progression   Progression Continue to progress workloads to maintain intensity without signs/symptoms of physical distress.      Resistance Training   Training Prescription Yes    Weight red bands    Reps 10-15    Time 10 Minutes      Recumbant Bike   Level 2    RPM 45    Watts 14    Minutes 15    METs 1.9      NuStep   Level 2    SPM 98    Minutes 15    METs 2.2          Social History   Tobacco Use  Smoking Status Former   Current packs/day: 0.00   Average packs/day: 0.5 packs/day for 21.0 years (10.5 ttl pk-yrs)   Types: Cigarettes   Start date: 02/09/1955   Quit date: 02/09/1976  Years since quitting: 47.8  Smokeless Tobacco Never    Goals Met:  Proper associated with RPD/PD & O2 Sat Independence with exercise equipment Exercise tolerated well No report of concerns or symptoms today Strength training completed today  Goals Unmet:  Not Applicable  Comments: Service time is from 1311 to 1440.    Dr. Slater Staff is Medical Director for Pulmonary Rehab at [facility] [place].

## 2023-12-21 ENCOUNTER — Encounter: Admitting: *Deleted

## 2023-12-21 VITALS — BP 145/65 | HR 57 | Temp 97.9°F | Resp 18 | Wt 173.0 lb

## 2023-12-21 DIAGNOSIS — Z006 Encounter for examination for normal comparison and control in clinical research program: Secondary | ICD-10-CM

## 2023-12-21 MED ORDER — STUDY - ORION 4 - INCLISIRAN 300 MG/1.5 ML OR PLACEBO SQ INJECTION (PI-STUCKEY)
300.0000 mg | INJECTION | SUBCUTANEOUS | Status: AC
  Administered 2023-12-21: 300 mg via SUBCUTANEOUS
  Filled 2023-12-21: qty 1.5

## 2023-12-21 NOTE — Research (Signed)
 [facility] [facility]   Patient having some upper leg issues.  She is currently waiting on an MRI to be scheduled to find out what is going on  Other than that she is doing well.  No chest pain or shortness of breath  Will see patient back in May of 2026. Could be her last visit.   Suzen Hardy :) RN BSN  Clinical Research Nurse  Be strong and take heart, all you who hope in the [place]. ~ Psalm 31:24

## 2023-12-22 ENCOUNTER — Encounter (HOSPITAL_COMMUNITY)
Admission: RE | Admit: 2023-12-22 | Discharge: 2023-12-22 | Disposition: A | Discharge status: Home / Self Care | Source: Ambulatory Visit | Attending: Pulmonary Disease | Admitting: Pulmonary Disease

## 2023-12-22 DIAGNOSIS — J449 Chronic obstructive pulmonary disease, unspecified: Secondary | ICD-10-CM | POA: Diagnosis not present

## 2023-12-22 LAB — GLUCOSE, CAPILLARY: Glucose-Capillary: 84 mg/dL (ref 70–99)

## 2023-12-22 NOTE — Progress Notes (Signed)
 Daily Session Note  Patient Details  Name: [name] MRN: [id number] Date of Birth: [birth year] Referring Provider:   Conrad Ports Pulmonary Rehab Walk Test from 11/07/2023 in [facility]  Referring Provider Dr. Lonna Coder    Encounter Date: 12/22/2023  Check In:  Session Check In - 12/22/23 1330       Check-In   Supervising physician immediately available to respond to emergencies [facility] MD immediately available    Physician(s) Josefa Beauvais, NP    [facility]    Staff Present Ronal Levin, RN, BSN;Mantaj Chamberlin Claudene, Neita Moats, MS, ACSM-CEP, Exercise Physiologist;Randi Midge BS, ACSM-CEP, Exercise Physiologist    Virtual Visit No    Medication changes reported     No    Fall or balance concerns reported    No    Tobacco Cessation No Change    Warm-up and Cool-down Performed as group-led instruction    Resistance Training Performed Yes    VAD Patient? No    PAD/SET Patient? No      Pain Assessment   Currently in Pain? No/denies          Capillary Blood Glucose: Results for orders placed or performed during the hospital encounter of 12/22/23 (from the past 24 hours)  Glucose, capillary     Status: None   Collection Time: 12/22/23  2:59 PM  Result Value Ref Range   Glucose-Capillary 84 70 - 99 mg/dL   *Note: Due to a large number of results and/or encounters for the requested time period, some results have not been displayed. A complete set of results can be found in Results Review.      Social History   Tobacco Use  Smoking Status Former   Current packs/day: 0.00   Average packs/day: 0.5 packs/day for 21.0 years (10.5 ttl pk-yrs)   Types: Cigarettes   Start date: 02/09/1955   Quit date: 02/09/1976   Years since quitting: 47.8  Smokeless Tobacco Never    Goals Met:  Proper associated with RPD/PD & O2 Sat Independence with exercise equipment Exercise tolerated well No  report of concerns or symptoms today Strength training completed today  Goals Unmet:  Not Applicable  Comments: Service time is from 1320 to 1445.    Dr. Slater Staff is Medical Director for Pulmonary Rehab at [facility].

## 2023-12-27 ENCOUNTER — Encounter (HOSPITAL_COMMUNITY)
Admission: RE | Admit: 2023-12-27 | Discharge: 2023-12-27 | Disposition: A | Source: Ambulatory Visit | Attending: Pulmonary Disease

## 2023-12-27 DIAGNOSIS — J449 Chronic obstructive pulmonary disease, unspecified: Secondary | ICD-10-CM

## 2023-12-27 LAB — GLUCOSE, CAPILLARY: Glucose-Capillary: 118 mg/dL — ABNORMAL HIGH (ref 70–99)

## 2023-12-27 NOTE — Progress Notes (Signed)
 Daily Session Note  Patient Details  Name: [name] MRN: [id number] Date of Birth: 10/22/1940 Referring Provider:   Conrad Ports Pulmonary Rehab Walk Test from 11/07/2023 in [facility]  Referring Provider Dr. Lonna Coder    Encounter Date: 12/27/2023  Check In:  Session Check In - 12/27/23 1426       Check-In   Supervising physician immediately available to respond to emergencies [facility] MD immediately available    Physician(s) Josefa Beauvais, NP    [facility]    Staff Present Ronal Levin, RN, BSN;Casey Claudene, Neita Moats, MS, ACSM-CEP, Exercise Physiologist;Elton Catalano Midge BS, ACSM-CEP, Exercise Physiologist    Virtual Visit No    Medication changes reported     No    Fall or balance concerns reported    No    Tobacco Cessation No Change    Warm-up and Cool-down Performed as group-led instruction    Resistance Training Performed Yes    VAD Patient? No    PAD/SET Patient? No      Pain Assessment   Currently in Pain? No/denies    Multiple Pain Sites No          Capillary Blood Glucose: No results found. However, due to the size of the patient record, not all encounters were searched. Please check Results Review for a complete set of results.    Social History   Tobacco Use  Smoking Status Former   Current packs/day: 0.00   Average packs/day: 0.5 packs/day for 21.0 years (10.5 ttl pk-yrs)   Types: Cigarettes   Start date: 02/09/1955   Quit date: 02/09/1976   Years since quitting: 47.9  Smokeless Tobacco Never    Goals Met:  Exercise tolerated well No report of concerns or symptoms today Strength training completed today  Goals Unmet:  Not Applicable  Comments: Service time is from 1317 to 1445.    Dr. Slater Staff is Medical Director for Pulmonary Rehab at [facility].

## 2023-12-29 ENCOUNTER — Encounter (HOSPITAL_COMMUNITY)
Admission: RE | Admit: 2023-12-29 | Discharge: 2023-12-29 | Disposition: A | Discharge status: Home / Self Care | Source: Ambulatory Visit | Attending: Pulmonary Disease | Admitting: Pulmonary Disease

## 2023-12-29 DIAGNOSIS — J449 Chronic obstructive pulmonary disease, unspecified: Secondary | ICD-10-CM | POA: Diagnosis not present

## 2023-12-29 LAB — GLUCOSE, CAPILLARY: Glucose-Capillary: 119 mg/dL — ABNORMAL HIGH (ref 70–99)

## 2023-12-29 NOTE — Progress Notes (Signed)
 Daily Session Note  Patient Details  Name: [name] MRN: [id number] Date of Birth: 07/06/1940 Referring Provider:   Conrad Ports Pulmonary Rehab Walk Test from 11/07/2023 in [facility]  Referring Provider Dr. Lonna Coder    Encounter Date: 12/29/2023  Check In:  Session Check In - 12/29/23 1319       Check-In   Supervising physician immediately available to respond to emergencies [facility] MD immediately available    Physician(s) Orren Fabry, PA    [facility]    Staff Present Ronal Levin, RN, BSN;Casey Claudene, Neita Moats, MS, ACSM-CEP, Exercise Physiologist;Randi Midge BS, ACSM-CEP, Exercise Physiologist    Virtual Visit No    Medication changes reported     No    Fall or balance concerns reported    No    Tobacco Cessation No Change    Warm-up and Cool-down Performed as group-led instruction    Resistance Training Performed Yes    VAD Patient? No    PAD/SET Patient? No      Pain Assessment   Currently in Pain? No/denies    Multiple Pain Sites No          Capillary Blood Glucose: Results for orders placed or performed during the hospital encounter of 12/29/23 (from the past 24 hours)  Glucose, capillary     Status: Abnormal   Collection Time: 12/29/23  2:37 PM  Result Value Ref Range   Glucose-Capillary 119 (H) 70 - 99 mg/dL   *Note: Due to a large number of results and/or encounters for the requested time period, some results have not been displayed. A complete set of results can be found in Results Review.      Social History   Tobacco Use  Smoking Status Former   Current packs/day: 0.00   Average packs/day: 0.5 packs/day for 21.0 years (10.5 ttl pk-yrs)   Types: Cigarettes   Start date: 02/09/1955   Quit date: 02/09/1976   Years since quitting: 47.9  Smokeless Tobacco Never    Goals Met:  Independence with exercise equipment Exercise tolerated well Queuing for  purse lip breathing No report of concerns or symptoms today Strength training completed today  Goals Unmet:  Not Applicable  Comments: Service time is from 1313 to 1435    Dr. Slater Staff is Medical Director for Pulmonary Rehab at [facility].

## 2024-01-03 ENCOUNTER — Encounter (HOSPITAL_COMMUNITY)
Admission: RE | Admit: 2024-01-03 | Discharge: 2024-01-03 | Disposition: A | Discharge status: Home / Self Care | Source: Ambulatory Visit | Attending: Pulmonary Disease | Admitting: Pulmonary Disease

## 2024-01-03 VITALS — Wt 175.0 lb

## 2024-01-03 DIAGNOSIS — J449 Chronic obstructive pulmonary disease, unspecified: Secondary | ICD-10-CM

## 2024-01-03 LAB — GLUCOSE, CAPILLARY: Glucose-Capillary: 89 mg/dL (ref 70–99)

## 2024-01-03 NOTE — Progress Notes (Signed)
 Daily Session Note  Patient Details  Name: [name] MRN: [id number] Date of Birth: [birth year] Referring Provider:   [facility] Walk Test from 11/07/2023 in [facility]  Referring Provider Dr. Lonna Coder    Encounter Date: 01/03/2024  Check In:  Session Check In - 01/03/24 1334       Check-In   Supervising physician immediately available to respond to emergencies [facility] MD immediately available    Physician(s) Rosabel Mose, NP    Location [facility]    Staff Present Ronal Levin, RN, BSN;Casey Claudene, Neita Moats, MS, ACSM-CEP, Exercise Physiologist;Randi Midge BS, ACSM-CEP, Exercise Physiologist    Virtual Visit No    Medication changes reported     No    Fall or balance concerns reported    No    Tobacco Cessation No Change    Warm-up and Cool-down Performed as group-led instruction    Resistance Training Performed Yes    VAD Patient? No    PAD/SET Patient? No      Pain Assessment   Currently in Pain? No/denies    Multiple Pain Sites No          Capillary Blood Glucose: Results for orders placed or performed during the hospital encounter of 01/03/24 (from the past 24 hours)  Glucose, capillary     Status: None   Collection Time: 01/03/24  2:48 PM  Result Value Ref Range   Glucose-Capillary 89 70 - 99 mg/dL   *Note: Due to a large number of results and/or encounters for the requested time period, some results have not been displayed. A complete set of results can be found in Results Review.     Exercise Prescription Changes - 01/03/24 1500       Response to Exercise   Blood Pressure (Admit) 105/54    Blood Pressure (Exercise) 124/70    Blood Pressure (Exit) 120/60    Heart Rate (Admit) 62 bpm    Heart Rate (Exercise) 78 bpm    Heart Rate (Exit) 66 bpm    Oxygen  Saturation (Admit) 98 %    Oxygen  Saturation (Exercise) 95 %    Oxygen  Saturation (Exit) 96 %     Rating of Perceived Exertion (Exercise) 15    Perceived Dyspnea (Exercise) 1    Duration Continue with 30 min of aerobic exercise without signs/symptoms of physical distress.    Intensity THRR unchanged      Progression   Progression Continue to progress workloads to maintain intensity without signs/symptoms of physical distress.      Resistance Training   Training Prescription Yes    Weight red bands    Reps 10-15    Time 10 Minutes      Recumbant Bike   Level 2    RPM 60    Watts 10    Minutes 15    METs 1.9      NuStep   Level 1   decreased from level 2 to 1   SPM 81    Minutes 15    METs 1.9          Social History   Tobacco Use  Smoking Status Former   Current packs/day: 0.00   Average packs/day: 0.5 packs/day for 21.0 years (10.5 ttl pk-yrs)   Types: Cigarettes   Start date: 02/09/1955   Quit date: 02/09/1976   Years since quitting: 47.9  Smokeless Tobacco Never  Goals Met:  Exercise tolerated well Queuing for purse lip breathing No report of concerns or symptoms today Strength training completed today  Goals Unmet:  Not Applicable  Comments: Service time is from 1321 to 1447    Dr. Slater Staff is Medical Director for Pulmonary Rehab at [facility].

## 2024-01-04 ENCOUNTER — Ambulatory Visit

## 2024-01-04 NOTE — Progress Notes (Signed)
 Pulmonary Individual Treatment Plan  Patient Details  Name: [name] MRN: [id number] Date of Birth: [birth year] Referring Provider:   Conrad Ports Pulmonary Rehab Walk Test from 11/07/2023 in [facility] for [facility], V[facility], [facility]  Referring Provider Dr. Lonna Coder    Initial Encounter Date:  Flowsheet Row Pulmonary Rehab Walk Test from 11/07/2023 in [facility] for Heart, Vascular, & Lung Health  Date 11/07/23    Visit Diagnosis: Stage 2 moderate COPD by GOLD classification (HCC)  Patient's Home Medications on Admission:   Current Outpatient Medications:    acetaminophen  (TYLENOL ) 325 MG tablet, Take 650 mg by mouth every 6 (six) hours as needed for moderate pain (pain score 4-6) or headache., Disp: , Rfl:    albuterol  (PROVENTIL  HFA;VENTOLIN  HFA) 108 (90 BASE) MCG/ACT inhaler, Inhale 2 puffs into the lungs every 6 (six) hours as needed. For shortness of breath., Disp: 1 Inhaler, Rfl: 0   albuterol  (PROVENTIL ) (2.5 MG/3ML) 0.083% nebulizer solution, Take 3 mLs (2.5 mg total) by nebulization every 6 (six) hours as needed for wheezing or shortness of breath., Disp: 360 mL, Rfl: 5   apixaban  (ELIQUIS ) 5 MG TABS tablet, Take 1 tablet (5 mg total) by mouth 2 (two) times daily., Disp: 180 tablet, Rfl: 3   Blood Glucose Monitoring Suppl (ACCU-CHEK GUIDE) w/Device KIT, 1 kit by Does not apply route as directed., Disp: 1 kit, Rfl: 0   calcium  carbonate (TUMS - DOSED IN MG ELEMENTAL CALCIUM ) 500 MG chewable tablet, Chew 1 tablet by mouth daily as needed for indigestion or heartburn., Disp: , Rfl:    Cholecalciferol  (VITAMIN D ) 50 MCG (2000 UT) tablet, Take 4,000 Units by mouth daily., Disp: , Rfl:    Continuous Glucose Sensor (FREESTYLE LIBRE 3 PLUS SENSOR) MISC, 1 Device by Other route every 14 (fourteen) days. Change sensor every 15 days., Disp: 6 each, Rfl: 3   Cyanocobalamin  (VITAMIN B-12 IJ), Inject 1,000 mcg into the muscle every  30 (thirty) days. (Patient not taking: Reported on 12/21/2023), Disp: , Rfl:    divalproex  (DEPAKOTE  ER) 250 MG 24 hr tablet, TAKE 1 TABLET (250 MG TOTAL) BY MOUTH AT BEDTIME. (Patient not taking: Reported on 12/21/2023), Disp: 90 tablet, Rfl: 1   ezetimibe  (ZETIA ) 10 MG tablet, Take 1 tablet (10 mg total) by mouth daily., Disp: 90 tablet, Rfl: 3   fluticasone  (FLONASE ) 50 MCG/ACT nasal spray, SPRAY 2 SPRAYS INTO EACH NOSTRIL EVERY DAY, Disp: 48 mL, Rfl: 3   Fluticasone -Umeclidin-Vilant (TRELEGY ELLIPTA ) 200-62.5-25 MCG/ACT AEPB, Inhale 1 puff into the lungs daily at 6 (six) AM., Disp: 60 each, Rfl: 5   furosemide  (LASIX ) 20 MG tablet, Take 1 tablet (20 mg total) by mouth daily. Take 40 mg for the next 3 days then take 20 mg once a day., Disp: 93 tablet, Rfl: 3   insulin  lispro protamine-lispro (HUMALOG  75/25 MIX) (75-25) 100 UNIT/ML SUSP injection, Inject 0.06 mLs (6 Units total) into the skin daily with breakfast AND 0.08 mLs (8 Units total) daily with supper. 10 units at lunch depending on bs reading., Disp: 15 mL, Rfl: 3   Insulin  Syringe-Needle U-100 (INSULIN  SYRINGE .3CC/29GX1) 29G X 1 0.3 ML MISC, 1 Device by Does not apply route in the morning and at bedtime., Disp: 200 each, Rfl: 3   isosorbide  mononitrate (IMDUR ) 60 MG 24 hr tablet, Take one tablet ( 60 mg ) twice daily 8 hours apart. (Patient not taking: Reported on 11/10/2023), Disp: 180 tablet, Rfl:  3   meclizine  (ANTIVERT ) 25 MG tablet, Take 25 mg by mouth 2 (two) times daily as needed for dizziness., Disp: , Rfl:    meloxicam (MOBIC) 15 MG tablet, Take 15 mg by mouth daily as needed for pain., Disp: , Rfl:    metoprolol  succinate (TOPROL -XL) 100 MG 24 hr tablet, Take 100 mg by mouth daily., Disp: , Rfl:    montelukast  (SINGULAIR ) 10 MG tablet, Take 1 tablet (10 mg total) by mouth at bedtime., Disp: 30 tablet, Rfl: 11   Multiple Vitamin (MULTIVITAMIN WITH MINERALS) TABS, Take 1 tablet by mouth daily., Disp: , Rfl:    nitroGLYCERIN   (NITROSTAT ) 0.4 MG SL tablet, Place 1 tablet (0.4 mg total) under the tongue every 5 (five) minutes as needed for chest pain., Disp: 25 tablet, Rfl: 3   ondansetron  (ZOFRAN ) 4 MG tablet, TAKE 1 TABLET BY MOUTH EVERY 8 HOURS AS NEEDED FOR NAUSEA AND VOMITING (Patient not taking: Reported on 12/21/2023), Disp: 20 tablet, Rfl: 1   pantoprazole  (PROTONIX ) 40 MG tablet, TAKE 1 TABLET BY MOUTH EVERY DAY (Patient not taking: Reported on 12/21/2023), Disp: 90 tablet, Rfl: 2   sertraline  (ZOLOFT ) 100 MG tablet, TAKE 1 TABLET BY MOUTH EVERY DAY, Disp: 90 tablet, Rfl: 2   spironolactone  (ALDACTONE ) 25 MG tablet, TAKE 1 TABLET (25 MG TOTAL) BY MOUTH DAILY. (Patient not taking: Reported on 12/21/2023), Disp: 90 tablet, Rfl: 1   tiZANidine  (ZANAFLEX ) 2 MG tablet, TAKE 1 TO 2 TABLETS (2-4 MG TOTAL) BY MOUTH AT BEDTIME AS NEEDED FOR MUSCLE SPASM, Disp: 180 tablet, Rfl: 0   Turmeric (QC TUMERIC COMPLEX PO), Take 1 capsule by mouth daily., Disp: , Rfl:   Current Facility-Administered Medications:    Study - ORION 4 - inclisiran 300 mg/1.40mL or placebo SQ injection (PI-Stuckey), 300 mg, Subcutaneous, Q6 months, , 300 mg at 12/21/23 1137  Past Medical History: Past Medical History:  Diagnosis Date   Adenomatous colon polyp    Allergy     Anxiety    Arthritis    Asthma    Carotid artery disease    carotid US  02/2017: bilat ICA 1-39%   Chronic diastolic CHF    Echo 02/2017: EF 65-70, Gr 2 DD, mild MS (mean 5), PASP 44   COPD    pt is unsure if has been officially diagnosed   Coronary artery disease    CABG '09- cathed 12/09, 9/10, 6/11, 3/14 and 12/13/16- medical Rx // cath 12/2016 - 2/3 grafts patent >> med Rx // Myoview  12/17: low risk   Diabetes mellitus    Dyspnea    with exertion   Gastroesophageal reflux disease    Headache    Hiatal hernia    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   Hyperlipidemia    Hypertension    Myocardial infarction (HCC)    PONV (postoperative nausea and vomiting)     Schatzki's ring    Shoulder injury    resolved after shoulder surgery   Sleep apnea    not on cpap    Tobacco Use: Social History   Tobacco Use  Smoking Status Former   Current packs/day: 0.00   Average packs/day: 0.5 packs/day for 21.0 years (10.5 ttl pk-yrs)   Types: Cigarettes   Start date: 02/09/1955   Quit date: 02/09/1976   Years since quitting: 47.9  Smokeless Tobacco Never    Labs: Review Flowsheet  More data exists      Latest Ref Rng & Units 10/05/2022 03/17/2023  09/09/2023 11/04/2023 11/11/2023  Labs for ITP Cardiac and Pulmonary Rehab  Cholestrol 100 - 199 mg/dL - - - 751  -  LDL (calc) 0 - 99 mg/dL - - - 817  -  HDL-C >60 mg/dL - - - 38  -  Trlycerides 0 - 149 mg/dL - - - 850  -  Hemoglobin A1c 4.6 - 6.5 % 7.3  7.8  9.0  - -  PH, Arterial 7.35 - 7.45 - - - - 7.351   PCO2 arterial 32 - 48 mmHg - - - - 51.1   Bicarbonate 20.0 - 28.0 mmol/L - - - - 28.3  28.2   TCO2 22 - 32 mmol/L - - - - 30  30   O2 Saturation % - - - - 85  59     Details       Multiple values from one day are sorted in reverse-chronological order         Capillary Blood Glucose: Lab Results  Component Value Date   GLUCAP 89 01/03/2024   GLUCAP 119 (H) 12/29/2023   GLUCAP 118 (H) 12/27/2023   GLUCAP 84 12/22/2023   GLUCAP 84 12/20/2023     Pulmonary Assessment Scores:  Pulmonary Assessment Scores     Row Name 11/07/23 1345         ADL UCSD   ADL Phase Entry     SOB Score total 88       CAT Score   CAT Score 29       mMRC Score   mMRC Score 3       UCSD: Self-administered rating of dyspnea associated with activities of daily living (ADLs) 6-point scale (0 = not at all to 5 = maximal or unable to do because of breathlessness)  Scoring Scores range from 0 to 120.  Minimally important difference is 5 units  CAT: CAT can identify the health impairment of COPD patients and is better correlated with disease progression.  CAT has a scoring range of zero to 40. The CAT  score is classified into four groups of low (less than 10), medium (10 - 20), high (21-30) and very high (31-40) based on the impact level of disease on health status. A CAT score over 10 suggests significant symptoms.  A worsening CAT score could be explained by an exacerbation, poor medication adherence, poor inhaler technique, or progression of COPD or comorbid conditions.  CAT MCID is 2 points  mMRC: mMRC (Modified Medical Research Council) Dyspnea Scale is used to assess the degree of baseline functional disability in patients of respiratory disease due to dyspnea. No minimal important difference is established. A decrease in score of 1 point or greater is considered a positive change.   Pulmonary Function Assessment:  Pulmonary Function Assessment - 11/07/23 1523       Breath   Bilateral Breath Sounds Decreased    Shortness of Breath Yes;Fear of Shortness of Breath;Limiting activity          Exercise Target Goals: Exercise Program Goal: Individual exercise prescription set using results from initial 6 min walk test and THRR while considering  patient's activity barriers and safety.   Exercise Prescription Goal: Initial exercise prescription builds to 30-45 minutes a day of aerobic activity, 2-3 days per week.  Home exercise guidelines will be given to patient during program as part of exercise prescription that the participant will acknowledge.  Activity Barriers & Risk Stratification:  Activity Barriers & Cardiac Risk Stratification - 11/07/23 1343  Activity Barriers & Cardiac Risk Stratification   Activity Barriers Deconditioning;Muscular Weakness;Shortness of Breath;Balance Concerns;History of Falls;Other (comment)    Comments bilateral shoulder replacements          6 Minute Walk:  6 Minute Walk     Row Name 11/07/23 1517         6 Minute Walk   Phase Initial     Distance 1080 feet     Walk Time 6 minutes     # of Rest Breaks 2  1:52-2:25, 4:51-5:21      MPH 2.05     METS 1.5     RPE 15     Perceived Dyspnea  3     VO2 Peak 5.26     Resting HR 50 bpm     Resting BP 132/64     Resting Oxygen  Saturation  95 %     Exercise Oxygen  Saturation  during 6 min walk 86 %  O2 dropped to 86%, stopped pt and O2 recovered within 10 seconds     Max Ex. HR 92 bpm     Max Ex. BP 170/74     2 Minute Post BP 140/70       Interval HR   1 Minute HR 57     2 Minute HR 78     3 Minute HR 79     4 Minute HR 81     5 Minute HR 92     6 Minute HR 86     2 Minute Post HR 62     Interval Heart Rate? Yes       Interval Oxygen    Interval Oxygen ? Yes     Baseline Oxygen  Saturation % 95 %     1 Minute Oxygen  Saturation % 99 %     1 Minute Liters of Oxygen  0 L     2 Minute Oxygen  Saturation % 94 %     2 Minute Liters of Oxygen  0 L     3 Minute Oxygen  Saturation % 90 %     3 Minute Liters of Oxygen  0 L     4 Minute Oxygen  Saturation % 87 %  stopped pt and O2 recovered w/in 10 seconds     4 Minute Liters of Oxygen  0 L     5 Minute Oxygen  Saturation % 86 %  stopped pt and O2 recovered w/in 10 seconds     5 Minute Liters of Oxygen  0 L     6 Minute Oxygen  Saturation % 91 %     6 Minute Liters of Oxygen  0 L     2 Minute Post Oxygen  Saturation % 97 %     2 Minute Post Liters of Oxygen  0 L        Oxygen  Initial Assessment:  Oxygen  Initial Assessment - 11/07/23 1345       Home Oxygen    Home Oxygen  Device None    Sleep Oxygen  Prescription None    Home Exercise Oxygen  Prescription None    Home Resting Oxygen  Prescription None      Initial 6 min Walk   Oxygen  Used None      Program Oxygen  Prescription   Program Oxygen  Prescription None      Intervention   Short Term Goals To learn and exhibit compliance with exercise, home and travel O2 prescription;To learn and understand importance of maintaining oxygen  saturations>88%;To learn and demonstrate proper use of respiratory medications;To learn and demonstrate proper pursed lip breathing techniques  or  other breathing techniques. ;To learn and understand importance of monitoring SPO2 with pulse oximeter and demonstrate accurate use of the pulse oximeter.    Long  Term Goals Verbalizes importance of monitoring SPO2 with pulse oximeter and return demonstration;Exhibits proper breathing techniques, such as pursed lip breathing or other method taught during program session;Demonstrates proper use of MDI's;Maintenance of O2 saturations>88%;Compliance with respiratory medication;Exhibits compliance with exercise, home  and travel O2 prescription          Oxygen  Re-Evaluation:  Oxygen  Re-Evaluation     Row Name 11/07/23 1547 12/01/23 0910 12/29/23 0955         Program Oxygen  Prescription   Program Oxygen  Prescription None None None       Home Oxygen    Home Oxygen  Device None None None     Sleep Oxygen  Prescription None None None     Home Exercise Oxygen  Prescription None None None     Home Resting Oxygen  Prescription None None None       Goals/Expected Outcomes   Short Term Goals To learn and exhibit compliance with exercise, home and travel O2 prescription;To learn and understand importance of maintaining oxygen  saturations>88%;To learn and demonstrate proper use of respiratory medications;To learn and demonstrate proper pursed lip breathing techniques or other breathing techniques. ;To learn and understand importance of monitoring SPO2 with pulse oximeter and demonstrate accurate use of the pulse oximeter. To learn and exhibit compliance with exercise, home and travel O2 prescription;To learn and understand importance of maintaining oxygen  saturations>88%;To learn and demonstrate proper use of respiratory medications;To learn and demonstrate proper pursed lip breathing techniques or other breathing techniques. ;To learn and understand importance of monitoring SPO2 with pulse oximeter and demonstrate accurate use of the pulse oximeter. To learn and exhibit compliance with exercise, home and  travel O2 prescription;To learn and understand importance of maintaining oxygen  saturations>88%;To learn and demonstrate proper use of respiratory medications;To learn and demonstrate proper pursed lip breathing techniques or other breathing techniques. ;To learn and understand importance of monitoring SPO2 with pulse oximeter and demonstrate accurate use of the pulse oximeter.     Long  Term Goals Verbalizes importance of monitoring SPO2 with pulse oximeter and return demonstration;Exhibits proper breathing techniques, such as pursed lip breathing or other method taught during program session;Demonstrates proper use of MDI's;Maintenance of O2 saturations>88%;Compliance with respiratory medication;Exhibits compliance with exercise, home  and travel O2 prescription Verbalizes importance of monitoring SPO2 with pulse oximeter and return demonstration;Exhibits proper breathing techniques, such as pursed lip breathing or other method taught during program session;Demonstrates proper use of MDI's;Maintenance of O2 saturations>88%;Compliance with respiratory medication;Exhibits compliance with exercise, home  and travel O2 prescription Verbalizes importance of monitoring SPO2 with pulse oximeter and return demonstration;Exhibits proper breathing techniques, such as pursed lip breathing or other method taught during program session;Demonstrates proper use of MDI's;Maintenance of O2 saturations>88%;Compliance with respiratory medication;Exhibits compliance with exercise, home  and travel O2 prescription     Goals/Expected Outcomes Compliance and understanding of oxygen  saturation monitoring and breathing techniques to decrease shortness of breath. Compliance and understanding of oxygen  saturation monitoring and breathing techniques to decrease shortness of breath. Compliance and understanding of oxygen  saturation monitoring and breathing techniques to decrease shortness of breath.        Oxygen  Discharge (Final Oxygen   Re-Evaluation):  Oxygen  Re-Evaluation - 12/29/23 0955       Program Oxygen  Prescription   Program Oxygen  Prescription None      Home Oxygen    Home Oxygen  Device  None    Sleep Oxygen  Prescription None    Home Exercise Oxygen  Prescription None    Home Resting Oxygen  Prescription None      Goals/Expected Outcomes   Short Term Goals To learn and exhibit compliance with exercise, home and travel O2 prescription;To learn and understand importance of maintaining oxygen  saturations>88%;To learn and demonstrate proper use of respiratory medications;To learn and demonstrate proper pursed lip breathing techniques or other breathing techniques. ;To learn and understand importance of monitoring SPO2 with pulse oximeter and demonstrate accurate use of the pulse oximeter.    Long  Term Goals Verbalizes importance of monitoring SPO2 with pulse oximeter and return demonstration;Exhibits proper breathing techniques, such as pursed lip breathing or other method taught during program session;Demonstrates proper use of MDI's;Maintenance of O2 saturations>88%;Compliance with respiratory medication;Exhibits compliance with exercise, home  and travel O2 prescription    Goals/Expected Outcomes Compliance and understanding of oxygen  saturation monitoring and breathing techniques to decrease shortness of breath.          Initial Exercise Prescription:  Initial Exercise Prescription - 11/07/23 1500       Date of Initial Exercise RX and Referring Provider   Date 11/07/23    Referring Provider Dr. Praveen Mannam    Expected Discharge Date 02/14/24      Recumbant Bike   Level 1    Minutes 15    METs 1.2      NuStep   Level 1    SPM 60    Minutes 15    METs 1.4      Prescription Details   Frequency (times per week) 2    Duration Progress to 30 minutes of continuous aerobic without signs/symptoms of physical distress      Intensity   THRR 40-80% of Max Heartrate 54-108    Ratings of Perceived Exertion  11-13    Perceived Dyspnea 0-4      Progression   Progression Continue to progress workloads to maintain intensity without signs/symptoms of physical distress.      Resistance Training   Training Prescription Yes    Weight red bands    Reps 10-15          Perform Capillary Blood Glucose checks as needed.  Exercise Prescription Changes:   Exercise Prescription Changes     Row Name 11/17/23 1528 12/06/23 1500 12/20/23 1500 01/03/24 1500       Response to Exercise   Blood Pressure (Admit) 150/70 112/62 132/68 105/54    Blood Pressure (Exercise) 154/72 142/72 142/64 124/70    Blood Pressure (Exit) 128/74 158/80 120/60 120/60    Heart Rate (Admit) 62 bpm 75 bpm 66 bpm 62 bpm    Heart Rate (Exercise) 78 bpm 78 bpm 66 bpm 78 bpm    Heart Rate (Exit) 67 bpm 69 bpm 64 bpm 66 bpm    Oxygen  Saturation (Admit) 100 % 97 % 100 % 98 %    Oxygen  Saturation (Exercise) 98 % 96 % 94 % 95 %    Oxygen  Saturation (Exit) 94 % 100 % 97 % 96 %    Rating of Perceived Exertion (Exercise) 14 14 10 15     Perceived Dyspnea (Exercise) 1 1 1 1     Duration Continue with 30 min of aerobic exercise without signs/symptoms of physical distress. Continue with 30 min of aerobic exercise without signs/symptoms of physical distress. Continue with 30 min of aerobic exercise without signs/symptoms of physical distress. Continue with 30 min of aerobic exercise without signs/symptoms  of physical distress.    Intensity THRR unchanged THRR unchanged THRR unchanged THRR unchanged      Progression   Progression Continue to progress workloads to maintain intensity without signs/symptoms of physical distress. Continue to progress workloads to maintain intensity without signs/symptoms of physical distress. Continue to progress workloads to maintain intensity without signs/symptoms of physical distress. Continue to progress workloads to maintain intensity without signs/symptoms of physical distress.      Resistance Training    Training Prescription Yes Yes Yes Yes    Weight red bands red bands red bands red bands    Reps 10-15 10-15 10-15 10-15    Time 10 Minutes 10 Minutes 10 Minutes 10 Minutes      Recumbant Bike   Level 1 1 2 2     RPM -- -- 45 60    Watts -- -- 14 10    Minutes 15 15 15 15     METs 1.7 1.8 1.9 1.9      NuStep   Level 1 1 2 1   decreased from level 2 to 1    SPM 91 87 98 81    Minutes 15 15 15 15     METs 2.2 2.2 2.2 1.9       Exercise Comments:   Exercise Comments     Row Name 11/17/23 1523           Exercise Comments Pt completed first day of group exercise. She exercised on the recumbent stepper for 15 min, level 1, METS 2.2. She then exercised on the recumbent stepper for 15 min, level 1, METs 1.7. She performed warm up and cool down standing, including squats. Using red bands. Discussed METs.          Exercise Goals and Review:   Exercise Goals     Row Name 11/07/23 1343             Exercise Goals   Increase Physical Activity Yes       Intervention Develop an individualized exercise prescription for aerobic and resistive training based on initial evaluation findings, risk stratification, comorbidities and participant's personal goals.;Provide advice, education, support and counseling about physical activity/exercise needs.       Expected Outcomes Short Term: Attend rehab on a regular basis to increase amount of physical activity.;Long Term: Exercising regularly at least 3-5 days a week.;Long Term: Add in home exercise to make exercise part of routine and to increase amount of physical activity.       Increase Strength and Stamina Yes       Intervention Provide advice, education, support and counseling about physical activity/exercise needs.;Develop an individualized exercise prescription for aerobic and resistive training based on initial evaluation findings, risk stratification, comorbidities and participant's personal goals.       Expected Outcomes Short Term:  Increase workloads from initial exercise prescription for resistance, speed, and METs.;Short Term: Perform resistance training exercises routinely during rehab and add in resistance training at home;Long Term: Improve cardiorespiratory fitness, muscular endurance and strength as measured by increased METs and functional capacity ( )       Able to understand and use rate of perceived exertion (RPE) scale Yes       Intervention Provide education and explanation on how to use RPE scale       Expected Outcomes Short Term: Able to use RPE daily in rehab to express subjective intensity level;Long Term:  Able to use RPE to guide intensity level when exercising independently  Able to understand and use Dyspnea scale Yes       Intervention Provide education and explanation on how to use Dyspnea scale       Expected Outcomes Short Term: Able to use Dyspnea scale daily in rehab to express subjective sense of shortness of breath during exertion;Long Term: Able to use Dyspnea scale to guide intensity level when exercising independently       Knowledge and understanding of Target Heart Rate Range (THRR) Yes       Intervention Provide education and explanation of THRR including how the numbers were predicted and where they are located for reference       Expected Outcomes Short Term: Able to state/look up THRR;Short Term: Able to use daily as guideline for intensity in rehab;Long Term: Able to use THRR to govern intensity when exercising independently       Understanding of Exercise Prescription Yes       Intervention Provide education, explanation, and written materials on patient's individual exercise prescription       Expected Outcomes Short Term: Able to explain program exercise prescription;Long Term: Able to explain home exercise prescription to exercise independently          Exercise Goals Re-Evaluation :  Exercise Goals Re-Evaluation     Row Name 11/07/23 1545 12/01/23 0908 12/29/23 0949          Exercise Goal Re-Evaluation   Exercise Goals Review Increase Physical Activity;Able to understand and use Dyspnea scale;Understanding of Exercise Prescription;Increase Strength and Stamina;Knowledge and understanding of Target Heart Rate Range (THRR);Able to understand and use rate of perceived exertion (RPE) scale Increase Physical Activity;Able to understand and use Dyspnea scale;Understanding of Exercise Prescription;Increase Strength and Stamina;Knowledge and understanding of Target Heart Rate Range (THRR);Able to understand and use rate of perceived exertion (RPE) scale Increase Physical Activity;Able to understand and use Dyspnea scale;Understanding of Exercise Prescription;Increase Strength and Stamina;Knowledge and understanding of Target Heart Rate Range (THRR);Able to understand and use rate of perceived exertion (RPE) scale     Comments Pt to begin exercise next week. Will monitor for progression. Pt has completed 2 exercise sessions, missing 3 for illness. She is exercising on the recumbent stepper for 15 min, level 1, METs 2.3. She then is exercising on the recumbent bike for 15 min, level 1, METs 1.8. She has not had enough time to begin progressing. She performs warm up and cool down standing with support, using red bands, 3.7 lbs. Will monitor for progression. Pt has completed 9 exercise sessions, missing 3 for illness. She is exercising on the recumbent stepper for 15 min, level 2, METs 2.4. She then is exercising on the recumbent bike for 15 min, level 2, METs 2. She is slowly progressing. She performs warm up and cool down standing with support, using red bands, 3.7 lbs. Will monitor for progression.     Expected Outcomes Through exercise at rehab and home, the patient will decrease shortness of breath with daily activities and feel confident in carrying out an exercise regimen at home. Through exercise at rehab and home, the patient will decrease shortness of breath with daily activities  and feel confident in carrying out an exercise regimen at home. Through exercise at rehab and home, the patient will decrease shortness of breath with daily activities and feel confident in carrying out an exercise regimen at home.        Discharge Exercise Prescription (Final Exercise Prescription Changes):  Exercise Prescription Changes - 01/03/24 1500  Response to Exercise   Blood Pressure (Admit) 105/54    Blood Pressure (Exercise) 124/70    Blood Pressure (Exit) 120/60    Heart Rate (Admit) 62 bpm    Heart Rate (Exercise) 78 bpm    Heart Rate (Exit) 66 bpm    Oxygen  Saturation (Admit) 98 %    Oxygen  Saturation (Exercise) 95 %    Oxygen  Saturation (Exit) 96 %    Rating of Perceived Exertion (Exercise) 15    Perceived Dyspnea (Exercise) 1    Duration Continue with 30 min of aerobic exercise without signs/symptoms of physical distress.    Intensity THRR unchanged      Progression   Progression Continue to progress workloads to maintain intensity without signs/symptoms of physical distress.      Resistance Training   Training Prescription Yes    Weight red bands    Reps 10-15    Time 10 Minutes      Recumbant Bike   Level 2    RPM 60    Watts 10    Minutes 15    METs 1.9      NuStep   Level 1   decreased from level 2 to 1   SPM 81    Minutes 15    METs 1.9          Nutrition:  Target Goals: Understanding of nutrition guidelines, daily intake of sodium 1500mg , cholesterol 200mg , calories 30% from fat and 7% or less from saturated fats, daily to have 5 or more servings of fruits and vegetables.  Biometrics:  Pre Biometrics - 11/07/23 1522       Pre Biometrics   Grip Strength 13 kg           Nutrition Therapy Plan and Nutrition Goals:   Nutrition Assessments:  MEDIFICTS Score Key: >=70 Need to make dietary changes  40-70 Heart Healthy Diet <= 40 Therapeutic Level Cholesterol Diet   Picture Your Plate Scores: <59 Unhealthy dietary  pattern with much room for improvement. 41-50 Dietary pattern unlikely to meet recommendations for good health and room for improvement. 51-60 More healthful dietary pattern, with some room for improvement.  >60 Healthy dietary pattern, although there may be some specific behaviors that could be improved.    Nutrition Goals Re-Evaluation:   Nutrition Goals Discharge (Final Nutrition Goals Re-Evaluation):   Psychosocial: Target Goals: Acknowledge presence or absence of significant depression and/or stress, maximize coping skills, provide positive support system. Participant is able to verbalize types and ability to use techniques and skills needed for reducing stress and depression.  Initial Review & Psychosocial Screening:  Initial Psych Review & Screening - 11/07/23 1346       Initial Review   Current issues with None Identified      Family Dynamics   Good Support System? Yes      Barriers   Psychosocial barriers to participate in program There are no identifiable barriers or psychosocial needs.      Screening Interventions   Interventions Encouraged to exercise    Expected Outcomes Short Term goal: Utilizing psychosocial counselor, staff and physician to assist with identification of specific Stressors or current issues interfering with healing process. Setting desired goal for each stressor or current issue identified.;Long Term Goal: Stressors or current issues are controlled or eliminated.;Short Term goal: Identification and review with participant of any Quality of Life or Depression concerns found by scoring the questionnaire.;Long Term goal: The participant improves quality of Life and PHQ9 Scores as  seen by post scores and/or verbalization of changes          Quality of Life Scores:  Scores of 19 and below usually indicate a poorer quality of life in these areas.  A difference of  2-3 points is a clinically meaningful difference.  A difference of 2-3 points in the total  score of the Quality of Life Index has been associated with significant improvement in overall quality of life, self-image, physical symptoms, and general health in studies assessing change in quality of life.  PHQ-9: Review Flowsheet  More data exists      11/07/2023 03/29/2023 10/19/2022 08/30/2022 08/25/2022  Depression screen PHQ 2/9  Decreased Interest 1 2 2 3 1   Down, Depressed, Hopeless 0 1 1 1 1   PHQ - 2 Score 1 3 3 4 2   Altered sleeping 1 2 3 2 1   Tired, decreased energy 1 2 3 3 1   Change in appetite 0 2 2 1  0  Feeling bad or failure about yourself  0 0 0 0 1  Trouble concentrating 0 1 1 1  0  Moving slowly or fidgety/restless 0 0 0 0 1  Suicidal thoughts 0 0 0 0 0  PHQ-9 Score 3  10  12  11  6    Difficult doing work/chores Somewhat difficult Somewhat difficult Somewhat difficult Somewhat difficult Somewhat difficult    Details       Data saved with a previous flowsheet row definition        Interpretation of Total Score  Total Score Depression Severity:  1-4 = Minimal depression, 5-9 = Mild depression, 10-14 = Moderate depression, 15-19 = Moderately severe depression, 20-27 = Severe depression   Psychosocial Evaluation and Intervention:  Psychosocial Evaluation - 11/07/23 1348       Psychosocial Evaluation & Interventions   Interventions Encouraged to exercise with the program and follow exercise prescription    Comments Pt denies any psy/soc barriers. Stated that recently her freezer stopped working and she is upset to have lost her frozen food. Stated she is working on getting it fixed. Pt stated she has great support from her daughters and eldest granddaughter. Denies any additional needs or resources at this time.    Expected Outcomes For Brittanya to participate in PR free of any psy/soc barriers or conerns. To get her freezer fixed    Continue Psychosocial Services  No Follow up required          Psychosocial Re-Evaluation:  Psychosocial Re-Evaluation     Row  Name 11/09/23 9070 11/28/23 1222 01/02/24 0946         Psychosocial Re-Evaluation   Current issues with None Identified None Identified None Identified     Comments Psychosocial monthly re-evaluation is as follows: No changes since orientation. Corynne is scheduled to start the program next week. She declined any referrals or additional resources at that time. Monthly psychosocial re-evaluation as follows: Pt denies any psy/soc barriers. Tonji got her freezer fixed. She declined needing any resources to replace the food that had spoiled. Pt was recently hospitalized for a blood clot. She is now taking a blood thinner. This did not stop Chelesea. She has come back to class and has been exercising to increase her strength and stamina. She stated she has great support from her daughters and eldest granddaughter. Citlally denies any additional needs or resources at this time. Monthly psychosocial re-evaluation as follows: Pt denies any psy/soc barriers. She states she has great support from her daughters and  eldest granddaughter. Arena denies any additional needs or resources at this time.     Expected Outcomes For Amberley to participate in PR free of any psy/soc barriers or conerns. For Tyneisha to participate in PR free of any psy/soc barriers or conerns. For Catheline to participate in PR free of any psy/soc barriers or conerns.     Interventions Encouraged to attend Pulmonary Rehabilitation for the exercise Encouraged to attend Pulmonary Rehabilitation for the exercise Encouraged to attend Pulmonary Rehabilitation for the exercise     Continue Psychosocial Services  No Follow up required No Follow up required No Follow up required        Psychosocial Discharge (Final Psychosocial Re-Evaluation):  Psychosocial Re-Evaluation - 01/02/24 0946       Psychosocial Re-Evaluation   Current issues with None Identified    Comments Monthly psychosocial re-evaluation as follows: Pt denies any psy/soc barriers. She states she has great support  from her daughters and eldest granddaughter. Cheyne denies any additional needs or resources at this time.    Expected Outcomes For Catlin to participate in PR free of any psy/soc barriers or conerns.    Interventions Encouraged to attend Pulmonary Rehabilitation for the exercise    Continue Psychosocial Services  No Follow up required          Education: Education Goals: Education classes will be provided on a weekly basis, covering required topics. Participant will state understanding/return demonstration of topics presented.  Learning Barriers/Preferences:  Learning Barriers/Preferences - 11/07/23 1348       Learning Barriers/Preferences   Learning Barriers None    Learning Preferences None          Education Topics: Know Your Numbers Group instruction that is supported by a PowerPoint presentation. Instructor discusses importance of knowing and understanding resting, exercise, and post-exercise oxygen  saturation, heart rate, and blood pressure. Oxygen  saturation, heart rate, blood pressure, rating of perceived exertion, and dyspnea are reviewed along with a normal range for these values.    Exercise for the Pulmonary Patient Group instruction that is supported by a PowerPoint presentation. Instructor discusses benefits of exercise, core components of exercise, frequency, duration, and intensity of an exercise routine, importance of utilizing pulse oximetry during exercise, safety while exercising, and options of places to exercise outside of rehab.    MET Level  Group instruction provided by PowerPoint, verbal discussion, and written material to support subject matter. Instructor reviews what METs are and how to increase METs.  Flowsheet Row PULMONARY REHAB CHRONIC OBSTRUCTIVE PULMONARY DISEASE from 12/15/2023 in [facility]  Date 12/15/23  Educator EP  Instruction Review Code 1- Verbalizes Understanding    Pulmonary  Medications Verbally interactive group education provided by instructor with focus on inhaled medications and proper administration.   Anatomy and Physiology of the Respiratory System Group instruction provided by PowerPoint, verbal discussion, and written material to support subject matter. Instructor reviews respiratory cycle and anatomical components of the respiratory system and their functions. Instructor also reviews differences in obstructive and restrictive respiratory diseases with examples of each.  Flowsheet Row PULMONARY REHAB CHRONIC OBSTRUCTIVE PULMONARY DISEASE from 12/29/2023 in [facility], [facility]  Date 12/29/23  Educator RT  Instruction Review Code 1- Verbalizes Understanding    Oxygen  Safety Group instruction provided by PowerPoint, verbal discussion, and written material to support subject matter. There is an overview of "What is Oxygen " and "Why do we need it".  Instructor also reviews how to create a safe environment for oxygen  use, the importance of using oxygen  as prescribed, and the risks of noncompliance. There is a brief discussion on traveling with oxygen  and resources the patient may utilize.   Oxygen  Use Group instruction provided by PowerPoint, verbal discussion, and written material to discuss how supplemental oxygen  is prescribed and different types of oxygen  supply systems. Resources for more information are provided.    Breathing Techniques Group instruction that is supported by demonstration and informational handouts. Instructor discusses the benefits of pursed lip and diaphragmatic breathing and detailed demonstration on how to perform both.  Flowsheet Row PULMONARY REHAB CHRONIC OBSTRUCTIVE PULMONARY DISEASE from 11/17/2023 in [facility]  Date 11/17/23  Educator RN  Instruction Review Code 1- Verbalizes Understanding     Risk Factor  Reduction Group instruction that is supported by a PowerPoint presentation. Instructor discusses the definition of a risk factor, different risk factors for pulmonary disease, and how the heart and lungs work together.   Pulmonary Diseases Group instruction provided by PowerPoint, verbal discussion, and written material to support subject matter. Instructor gives an overview of the different type of pulmonary diseases. There is also a discussion on risk factors and symptoms as well as ways to manage the diseases. Flowsheet Row PULMONARY REHAB CHRONIC OBSTRUCTIVE PULMONARY DISEASE from 12/22/2023 in [facility]  Date 12/22/23  Educator RT  Instruction Review Code 1- Verbalizes Understanding    Stress and Energy Conservation Group instruction provided by PowerPoint, verbal discussion, and written material to support subject matter. Instructor gives an overview of stress and the impact it can have on the body. Instructor also reviews ways to reduce stress. There is also a discussion on energy conservation and ways to conserve energy throughout the day. Flowsheet Row PULMONARY REHAB CHRONIC OBSTRUCTIVE PULMONARY DISEASE from 11/24/2023 in [facility]  Date 11/24/23  Educator RN  Instruction Review Code 1- Verbalizes Understanding    Warning Signs and Symptoms Group instruction provided by PowerPoint, verbal discussion, and written material to support subject matter. Instructor reviews warning signs and symptoms of stroke, heart attack, cold and flu. Instructor also reviews ways to prevent the spread of infection.   Other Education Group or individual verbal, written, or video instructions that support the educational goals of the pulmonary rehab program.    Knowledge Questionnaire Score:  Knowledge Questionnaire Score - 11/07/23 1458       Knowledge Questionnaire Score    Pre Score 17/18          Core Components/Risk Factors/Patient Goals at Admission:  Personal Goals and Risk Factors at Admission - 11/07/23 1525       Core Components/Risk Factors/Patient Goals on Admission   Improve shortness of breath with ADL's Yes    Intervention Provide education, individualized exercise plan and daily activity instruction to help decrease symptoms of SOB with activities of daily living.    Expected Outcomes Short Term: Improve cardiorespiratory fitness to achieve a reduction of symptoms when performing ADLs;Long Term: Be able to perform more ADLs without symptoms or delay the onset of symptoms          Core Components/Risk Factors/Patient Goals Review:   Goals and Risk Factor Review     Row Name 11/09/23 0931 11/28/23 1224 01/02/24 0947         Core Components/Risk Factors/Patient  Goals Review   Personal Goals Review Improve shortness of breath with ADL's;Develop more efficient breathing techniques such as purse lipped breathing and diaphragmatic breathing and practicing self-pacing with activity. Improve shortness of breath with ADL's;Develop more efficient breathing techniques such as purse lipped breathing and diaphragmatic breathing and practicing self-pacing with activity. Improve shortness of breath with ADL's;Develop more efficient breathing techniques such as purse lipped breathing and diaphragmatic breathing and practicing self-pacing with activity.     Review Monthly review of patient's Core Components/Risk Factors/Patient Goals are as follows: Unable to assess goals yet. Yari is scheduled to start the program next week. Monthly review of patient's Core Components/Risk Factors/Patient Goals are as follows: Goal progressing for improving shortness of breath with ADL's. Gisele is currently exercising on room air with exertion to maintain sats >88%. She has completed 2 sessions so far and is exercising and the seated bike. Goal progressing for developing more  efficient breathing techniques such as purse lipped breathing and diaphragmatic breathing; and practicing self-pacing with activity. Tallulah is working on purse lipped breathing while short of breath. She needs to be prompted while performing the warmup and while exercising. She has begun to work on diaphragmatic breathing at home. Lorianne will continue to benefit from PR for nutrition, education, exercise, and lifestyle modification. Monthly review of patient's Core Components/Risk Factors/Patient Goals are as follows: Goal progressing for improving shortness of breath with ADL's. Maggie is currently exercising on room air with exertion to maintain sats >88%. She has completed 10 sessions and is exercising on the NuStep and the seated bike. She has been able to increase her speed, workload, and METs. Goal met for developing more efficient breathing techniques such as purse lipped breathing and diaphragmatic breathing; and practicing self-pacing with activity. Grisela is able to perform purse lipped breathing while short of breath independently. She demonstrates this while performing the warmup and while exercising. She works on diaphragmatic breathing at home. Susette will continue to benefit from PR for nutrition, education, exercise, and lifestyle modification.     Expected Outcomes Pt will show progress toward meeting expected goals and outcomes. Pt will show progress toward meeting expected goals and outcomes. Pt will show progress toward meeting expected goals and outcomes.        Core Components/Risk Factors/Patient Goals at Discharge (Final Review):   Goals and Risk Factor Review - 01/02/24 0947       Core Components/Risk Factors/Patient Goals Review   Personal Goals Review Improve shortness of breath with ADL's;Develop more efficient breathing techniques such as purse lipped breathing and diaphragmatic breathing and practicing self-pacing with activity.    Review Monthly review of patient's Core Components/Risk  Factors/Patient Goals are as follows: Goal progressing for improving shortness of breath with ADL's. Danyele is currently exercising on room air with exertion to maintain sats >88%. She has completed 10 sessions and is exercising on the NuStep and the seated bike. She has been able to increase her speed, workload, and METs. Goal met for developing more efficient breathing techniques such as purse lipped breathing and diaphragmatic breathing; and practicing self-pacing with activity. Tearsa is able to perform purse lipped breathing while short of breath independently. She demonstrates this while performing the warmup and while exercising. She works on diaphragmatic breathing at home. Eulla will continue to benefit from PR for nutrition, education, exercise, and lifestyle modification.    Expected Outcomes Pt will show progress toward meeting expected goals and outcomes.          ITP  Comments: Pt is making expected progress toward Pulmonary Rehab goals after completing 11 session(s). Recommend continued exercise, life style modification, education, and utilization of breathing techniques to increase stamina and strength, while also decreasing shortness of breath with exertion.  Dr. Slater Staff is Medical Director for Pulmonary Rehab at [facility].

## 2024-01-09 ENCOUNTER — Encounter (HOSPITAL_COMMUNITY): Payer: Self-pay

## 2024-01-09 ENCOUNTER — Other Ambulatory Visit: Payer: Self-pay

## 2024-01-09 ENCOUNTER — Emergency Department (HOSPITAL_COMMUNITY)

## 2024-01-09 ENCOUNTER — Emergency Department (HOSPITAL_COMMUNITY)
Admission: EM | Admit: 2024-01-09 | Discharge: 2024-01-09 | Disposition: A | Discharge status: Home / Self Care | Attending: Emergency Medicine | Admitting: Emergency Medicine

## 2024-01-09 DIAGNOSIS — M79605 Pain in left leg: Secondary | ICD-10-CM | POA: Diagnosis present

## 2024-01-09 DIAGNOSIS — Z7901 Long term (current) use of anticoagulants: Secondary | ICD-10-CM | POA: Insufficient documentation

## 2024-01-09 DIAGNOSIS — M25552 Pain in left hip: Secondary | ICD-10-CM | POA: Diagnosis not present

## 2024-01-09 DIAGNOSIS — J449 Chronic obstructive pulmonary disease, unspecified: Secondary | ICD-10-CM | POA: Diagnosis not present

## 2024-01-09 DIAGNOSIS — I10 Essential (primary) hypertension: Secondary | ICD-10-CM | POA: Diagnosis not present

## 2024-01-09 DIAGNOSIS — R52 Pain, unspecified: Secondary | ICD-10-CM

## 2024-01-09 LAB — COMPREHENSIVE METABOLIC PANEL WITH GFR
ALT: 12 U/L (ref 0–44)
AST: 19 U/L (ref 15–41)
Albumin: 3.8 g/dL (ref 3.5–5.0)
Alkaline Phosphatase: 87 U/L (ref 38–126)
Anion gap: 14 (ref 5–15)
BUN: 35 mg/dL — ABNORMAL HIGH (ref 8–23)
CO2: 25 mmol/L (ref 22–32)
Calcium: 9.6 mg/dL (ref 8.9–10.3)
Chloride: 99 mmol/L (ref 98–111)
Creatinine, Ser: 1.07 mg/dL — ABNORMAL HIGH (ref 0.44–1.00)
GFR, Estimated: 51 mL/min — ABNORMAL LOW (ref >60)
Glucose, Bld: 177 mg/dL — ABNORMAL HIGH (ref 70–99)
Potassium: 4.6 mmol/L (ref 3.5–5.1)
Sodium: 138 mmol/L (ref 135–145)
Total Bilirubin: 0.7 mg/dL (ref 0.0–1.2)
Total Protein: 7.2 g/dL (ref 6.5–8.1)

## 2024-01-09 LAB — MAGNESIUM: Magnesium: 1.8 mg/dL (ref 1.7–2.4)

## 2024-01-09 LAB — CBC WITH DIFFERENTIAL/PLATELET
Abs Immature Granulocytes: 0.04 K/uL (ref 0.00–0.07)
Basophils Absolute: 0.1 K/uL (ref 0.0–0.1)
Basophils Relative: 1 %
Eosinophils Absolute: 0.1 K/uL (ref 0.0–0.5)
Eosinophils Relative: 1 %
HCT: 40.1 % (ref 36.0–46.0)
Hemoglobin: 12.6 g/dL (ref 12.0–15.0)
Immature Granulocytes: 1 %
Lymphocytes Relative: 20 %
Lymphs Abs: 1.7 K/uL (ref 0.7–4.0)
MCH: 27.5 pg (ref 26.0–34.0)
MCHC: 31.4 g/dL (ref 30.0–36.0)
MCV: 87.6 fL (ref 80.0–100.0)
Monocytes Absolute: 0.6 K/uL (ref 0.1–1.0)
Monocytes Relative: 7 %
Neutro Abs: 6.1 K/uL (ref 1.7–7.7)
Neutrophils Relative %: 70 %
Platelets: 303 K/uL (ref 150–400)
RBC: 4.58 MIL/uL (ref 3.87–5.11)
RDW: 15.9 % — ABNORMAL HIGH (ref 11.5–15.5)
WBC: 8.5 K/uL (ref 4.0–10.5)
nRBC: 0 % (ref 0.0–0.2)

## 2024-01-09 LAB — CK: Total CK: 71 U/L (ref 38–234)

## 2024-01-09 MED ORDER — HYDROMORPHONE HCL 1 MG/ML IJ SOLN
0.5000 mg | Freq: Once | INTRAMUSCULAR | Status: AC
  Administered 2024-01-09: 0.5 mg via INTRAVENOUS
  Filled 2024-01-09: qty 1

## 2024-01-09 MED ORDER — KETOROLAC TROMETHAMINE 30 MG/ML IJ SOLN
15.0000 mg | Freq: Once | INTRAMUSCULAR | Status: AC
  Administered 2024-01-09: 15 mg via INTRAVENOUS
  Filled 2024-01-09: qty 1

## 2024-01-09 MED ORDER — HYDROMORPHONE HCL 2 MG PO TABS: 1.0000 mg | ORAL_TABLET | ORAL | 0 refills | Status: DC | PRN

## 2024-01-09 MED ORDER — KETOROLAC TROMETHAMINE 30 MG/ML IJ SOLN
15.0000 mg | Freq: Once | INTRAMUSCULAR | Status: AC
  Administered 2024-01-09: 15 mg via INTRAVENOUS
  Filled 2024-01-09 (×2): qty 1

## 2024-01-09 NOTE — ED Notes (Signed)
 Family at bedside.

## 2024-01-09 NOTE — ED Triage Notes (Signed)
 Pt BIB [facility] from home for 10/10 L thigh pain x3 weeks. Pt had blood clot in R leg 2 wks ago, finished BT tx on Friday. DVT study was neg on L leg. BP 180/palp per [facility], all other VSS.

## 2024-01-09 NOTE — ED Notes (Signed)
 Pt ambulated with a walker.  Pt was unsteady and was only able to ambulate to the distance of the room right next to her and back.  Pt reported she was in a lot of pain.

## 2024-01-09 NOTE — Progress Notes (Signed)
 Left lower extremity venous duplex has been completed. Preliminary results can be found in CV Proc through chart review.  Results were given to [name] Lloyd PA.   01/09/24 10:45 AM Cathlyn Collet RVT

## 2024-01-09 NOTE — ED Provider Notes (Signed)
 [place] [facility] Provider Note   CSN: [id number] Arrival date & time: 01/09/24  0825     Patient presents with: Leg Pain   [name] is a 83 y.o. female with a history of hypertension, COPD, CVA, and DVT, presents to the ED with left thigh pain x 3 weeks.  Patient states that she had a DVT study done on both legs 2 weeks ago and found to have a DVT in her right leg however left leg was unremarkable at the time.  Patient states that over the last 3 weeks she has had worsening mobility on the left lower extremity.  Patient states that she feels the pain from her hip down to her foot.  Patient states that there is wax/wane numbness and tingling. No recent travel. No sick contacts. Patient is in no acute distress.   Leg Pain      Prior to Admission medications   Medication Sig Start Date End Date Taking? Authorizing Provider  HYDROmorphone  (DILAUDID ) 2 MG tablet Take 0.5 tablets (1 mg total) by mouth every 4 (four) hours as needed for severe pain (pain score 7-10). 01/09/24  Yes Dreama Longs, MD  acetaminophen  (TYLENOL ) 325 MG tablet Take 650 mg by mouth every 6 (six) hours as needed for moderate pain (pain score 4-6) or headache.    [provider]  albuterol  (PROVENTIL  HFA;VENTOLIN  HFA) 108 (90 BASE) MCG/ACT inhaler Inhale 2 puffs into the lungs every 6 (six) hours as needed. For shortness of breath. 03/06/12   Brien Belvie BRAVO, MD  albuterol  (PROVENTIL ) (2.5 MG/3ML) 0.083% nebulizer solution Take 3 mLs (2.5 mg total) by nebulization every 6 (six) hours as needed for wheezing or shortness of breath. 10/19/22   Geofm Glade PARAS, MD  apixaban  (ELIQUIS ) 5 MG TABS tablet Take 1 tablet (5 mg total) by mouth 2 (two) times daily. 11/21/23   Azobou Donley Joelle DEL, MD  Blood Glucose Monitoring Suppl (ACCU-CHEK GUIDE) w/Device KIT 1 kit by Does not apply route as directed. 09/27/23   Geofm Glade PARAS, MD  calcium  carbonate (TUMS - DOSED IN MG  ELEMENTAL CALCIUM ) 500 MG chewable tablet Chew 1 tablet by mouth daily as needed for indigestion or heartburn.    [provider]  Cholecalciferol  (VITAMIN D ) 50 MCG (2000 UT) tablet Take 4,000 Units by mouth daily.    [provider]  Continuous Glucose Sensor (FREESTYLE LIBRE 3 PLUS SENSOR) MISC 1 Device by Other route every 14 (fourteen) days. Change sensor every 15 days. 03/17/23   Shamleffer, Ibtehal Jaralla, MD  Cyanocobalamin  (VITAMIN B-12 IJ) Inject 1,000 mcg into the muscle every 30 (thirty) days. Patient not taking: Reported on 12/21/2023    [provider]  divalproex  (DEPAKOTE  ER) 250 MG 24 hr tablet TAKE 1 TABLET (250 MG TOTAL) BY MOUTH AT BEDTIME. Patient not taking: Reported on 12/21/2023 02/03/23   Skeet Juliene SAUNDERS, DO  ezetimibe  (ZETIA ) 10 MG tablet Take 1 tablet (10 mg total) by mouth daily. 11/04/23 02/02/24  Azobou Donley Joelle DEL, MD  fluticasone  (FLONASE ) 50 MCG/ACT nasal spray SPRAY 2 SPRAYS INTO EACH NOSTRIL EVERY DAY 05/03/23   Mannam, Praveen, MD  Fluticasone -Umeclidin-Vilant (TRELEGY ELLIPTA ) 200-62.5-25 MCG/ACT AEPB Inhale 1 puff into the lungs daily at 6 (six) AM. 01/24/23   Burns, Glade PARAS, MD  furosemide  (LASIX ) 20 MG tablet Take 1 tablet (20 mg total) by mouth daily. Take 40 mg for the next 3 days then take 20 mg once a day.  02/21/23   West, Katlyn D, NP  HYDROcodone -acetaminophen  (NORCO/VICODIN) 5-325 MG tablet Take 2 tablets by mouth every 4 (four) hours as needed. 01/12/24   Laurice Maude BROCKS, MD  insulin  lispro protamine-lispro (HUMALOG  75/25 MIX) (75-25) 100 UNIT/ML SUSP injection Inject 0.06 mLs (6 Units total) into the skin daily with breakfast AND 0.08 mLs (8 Units total) daily with supper. 10 units at lunch depending on bs reading. 03/17/23   Shamleffer, Ibtehal Jaralla, MD  Insulin  Syringe-Needle U-100 (INSULIN  SYRINGE .3CC/29GX1) 29G X 1 0.3 ML MISC 1 Device by Does not apply route in the morning and at bedtime. 03/17/23   Shamleffer, Ibtehal  Jaralla, MD  isosorbide  mononitrate (IMDUR ) 60 MG 24 hr tablet Take one tablet ( 60 mg ) twice daily 8 hours apart. Patient not taking: Reported on 11/10/2023 05/14/20   Lelon Hamilton T, PA-C  meclizine  (ANTIVERT ) 25 MG tablet Take 25 mg by mouth 2 (two) times daily as needed for dizziness.    [provider]  meloxicam (MOBIC) 15 MG tablet Take 15 mg by mouth daily as needed for pain. 02/12/23   [provider]  metoprolol  succinate (TOPROL -XL) 100 MG 24 hr tablet Take 100 mg by mouth daily. 04/21/23   [provider]  montelukast  (SINGULAIR ) 10 MG tablet Take 1 tablet (10 mg total) by mouth at bedtime. 10/05/23   Mannam, Praveen, MD  Multiple Vitamin (MULTIVITAMIN WITH MINERALS) TABS Take 1 tablet by mouth daily.    [provider]  nitroGLYCERIN  (NITROSTAT ) 0.4 MG SL tablet Place 1 tablet (0.4 mg total) under the tongue every 5 (five) minutes as needed for chest pain. 07/22/21   Claudene Victory ORN, MD  ondansetron  (ZOFRAN ) 4 MG tablet TAKE 1 TABLET BY MOUTH EVERY 8 HOURS AS NEEDED FOR NAUSEA AND VOMITING Patient not taking: Reported on 12/21/2023 12/01/22   Geofm Glade PARAS, MD  pantoprazole  (PROTONIX ) 40 MG tablet TAKE 1 TABLET BY MOUTH EVERY DAY Patient not taking: Reported on 12/21/2023 10/31/23   Geofm Glade PARAS, MD  predniSONE  (DELTASONE ) 10 MG tablet Take 4 tablets (40 mg total) by mouth daily. 01/12/24   Laurice Maude BROCKS, MD  sertraline  (ZOLOFT ) 100 MG tablet TAKE 1 TABLET BY MOUTH EVERY DAY 04/20/23   Geofm Glade PARAS, MD  spironolactone  (ALDACTONE ) 25 MG tablet TAKE 1 TABLET (25 MG TOTAL) BY MOUTH DAILY. Patient not taking: Reported on 12/21/2023 10/31/23   Geofm Glade PARAS, MD  tiZANidine  (ZANAFLEX ) 2 MG tablet TAKE 1 TO 2 TABLETS (2-4 MG TOTAL) BY MOUTH AT BEDTIME AS NEEDED FOR MUSCLE SPASM 08/24/23   Joshua Debby CROME, MD  Turmeric (QC TUMERIC COMPLEX PO) Take 1 capsule by mouth daily.    [provider]    Allergies: Amlodipine, Banana, Co q10 [coenzyme q10],  Codeine, Insulin  aspart (human analog) (yeast), Lisinopril , Metformin  and related, Morphine, Pentazocine, Pravastatin , Repatha [evolocumab], Statins, Sulfa antibiotics, Sulfonamide derivatives, and Tramadol  hcl    Review of Systems  Musculoskeletal:  Positive for arthralgias.    Updated Vital Signs BP (!) 114/59   Pulse 61   Temp 98.2 F (36.8 C) (Oral)   Resp (!) 21   Ht 5' 2 (1.575 m)   Wt 77.1 kg   LMP  (LMP Unknown)   SpO2 98%   BMI 31.09 kg/m   Physical Exam Vitals and nursing note reviewed.  Constitutional:      General: She is not in acute distress.    Appearance: Normal appearance.  HENT:     Head:  Normocephalic and atraumatic.  Eyes:     Extraocular Movements: Extraocular movements intact.     Conjunctiva/sclera: Conjunctivae normal.     Pupils: Pupils are equal, round, and reactive to light.  Cardiovascular:     Rate and Rhythm: Normal rate and regular rhythm.     Pulses: Normal pulses.  Pulmonary:     Effort: Pulmonary effort is normal. No respiratory distress.  Abdominal:     General: Abdomen is flat.     Palpations: Abdomen is soft.     Tenderness: There is no abdominal tenderness.  Musculoskeletal:        General: Normal range of motion.     Cervical back: Normal range of motion.     Left hip: Bony tenderness present. No deformity.     Left upper leg: Tenderness present. No swelling, edema or deformity.     Comments: Patient has pain on palpation to the left lower extremity starting from the SI joint, distal thigh, and knee.  Patient has positive straight leg test.  Patient has bony tenderness at the hip joint.  No edema, pulses intact.  Skin:    General: Skin is warm and dry.     Capillary Refill: Capillary refill takes less than 2 seconds.  Neurological:     General: No focal deficit present.     Mental Status: She is alert. Mental status is at baseline.  Psychiatric:        Mood and Affect: Mood normal.    (all labs ordered are listed, but only  abnormal results are displayed) Labs Reviewed  CBC WITH DIFFERENTIAL/PLATELET - Abnormal; Notable for the following components:      Result Value   RDW 15.9 (*)    All other components within normal limits  COMPREHENSIVE METABOLIC PANEL WITH GFR - Abnormal; Notable for the following components:   Glucose, Bld 177 (*)    BUN 35 (*)    Creatinine, Ser 1.07 (*)    GFR, Estimated 51 (*)    All other components within normal limits  CK  MAGNESIUM     EKG: EKG Interpretation Date/Time:  Monday January 09 2024 08:39:46 EST Ventricular Rate:  55 PR Interval:  154 QRS Duration:  154 QT Interval:  475 QTC Calculation: 455 R Axis:   63  Text Interpretation: Sinus rhythm Right bundle branch block No significant change since last tracing Confirmed by Dreama Longs ([id number]) on 01/09/2024 12:18:50 PM  Radiology: CT Hip Left Wo Contrast Result Date: 01/12/2024 CLINICAL DATA:  Hip pain, stress fracture suspected, neg xray EXAM: CT OF THE LEFT HIP WITHOUT CONTRAST TECHNIQUE: Multidetector CT imaging of the left hip was performed according to the standard protocol. Multiplanar CT image reconstructions were also generated. RADIATION DOSE REDUCTION: This exam was performed according to the departmental dose-optimization program which includes automated exposure control, adjustment of the mA and/or kV according to patient size and/or use of iterative reconstruction technique. COMPARISON:  Radiographs 01/09/2024 FINDINGS: Technical note: Despite efforts by the technologist and patient, moderate to severe motion artifact is present on today's exam and could not be eliminated. This reduces exam sensitivity and specificity. Patient could not complete the examination due to pain. Bones/Joint/Cartilage Osseous evaluation limited by motion. No obvious acute fracture, dislocation or osteonecrosis. Mild left hip degenerative changes. No evidence of large hip joint effusion. Ligaments Suboptimally assessed by CT.  Muscles and Tendons No focal muscular abnormalities are identified. Soft tissues Iliofemoral atherosclerosis. No evidence of periarticular fluid collection, foreign body or soft tissue  emphysema. IMPRESSION: 1. Limited examination due to motion artifact. If the patient has persistent unexplained symptoms, follow-up imaging may be warranted. 2. No obvious acute osseous findings. 3. Mild left hip degenerative changes. 4. No evidence of periarticular fluid collection, foreign body or soft tissue emphysema. Electronically Signed   By: Elsie Perone M.D.   On: 01/12/2024 11:14     Procedures   Medications Ordered in the ED  ketorolac  (TORADOL ) 30 MG/ML injection 15 mg (15 mg Intravenous Given 01/09/24 0938)  ketorolac  (TORADOL ) 30 MG/ML injection 15 mg (15 mg Intravenous Given 01/09/24 1152)  HYDROmorphone  (DILAUDID ) injection 0.5 mg (0.5 mg Intravenous Given 01/09/24 1319)                                 Medical Decision Making  Patient presents to the ED for: Left lower extremity pain This involves an extensive number of treatment options and is a complaint that carries with it a high risk of complications  Differential diagnosis includes: DVT-history of same Traumatic etiology Minor MSK etiology Sciatica Co-morbid conditions: COPD, DVT, hypertension  Additional history/records obtained and reviewed: Additional history obtained from  outside medical records External records from outside source obtained and reviewed including multiple prior records were reviewed including PCP visits and prior admission for DVT/ultrasound.  Clinical Course as of 01/14/24 0623  Mon Jan 09, 2024  0900 BP(!): 117/91 Vital stable.  Patient in no acute distress. [ML]  1322 Comprehensive metabolic panel(!) No acute findings [ML]  1322 CBC with Differential(!) No acute findings [ML]  1322 Magnesium  WNL [ML]  1322 CK WNL [ML]  1322 BP(!): 117/91 On reassessment, patient relays that Toradol  has not helped  with her pain -will trial 0.5 mg hydromorphone  for pain relief. [ML]  1324 DG Hip Unilat With Pelvis 2-3 Views Left No acute findings - chronic osteoarthritis [ML]  1324 Chronic changes.  No acute findings. [ML]  1325 EKG 12-Lead Sinus rhythm [ML]    Clinical Course User Index [ML] Willma Duwaine CROME, PA    Data Reviewed / Actions Taken: Labs ordered/reviewed with my independent interpretation in ED course above. Imaging ordered/reviewed with my independent interpretation in ED course above. I agree with the radiologists interpretation.  EKG ordered/reviewed with my independent interpretation in ED course above. The patient was kept on continuous cardiac monitoring during the ED stay.  Management / Treatments: See ED course above for medications, treatments administered, and clinical rationale.   Reevaluation of the patient after these medicines showed that the patient improved with repeat pain management dosing. I have reviewed the patients home medicines and have made adjustments as needed  ED Course / Reassessments: Problem List: Left lower extremity pain 83 year old female presented for left lower extremity pain, and the patient was thoroughly evaluated. Initial assessment included history, physical exam, and review of prior medical records. Vital signs were obtained and monitored, and the patient remained stable throughout the stay. General physical exam included focused assessment of left lower extremity and revealed bony tenderness, pain that spread distally from SI joint down to patient's foot, and positive straight leg test. Management included pain management, with ongoing reassessment.  Given physical exam findings, unremarkable laboratory studies, imaging indicative of chronic arthritic changes, patient's improvement with pain management regimen plan to discharge home with supportive care measures and pain management prescription and close follow-up with PCP for further evaluation and  care. Patient response: improved Serial reassessments performed: Yes  Disposition: Disposition: Discharge with close follow-up with PCP for further evaluation and care. Rationale for disposition: stable for discharge The disposition plan and rationale were discussed with the patient at the bedside, all questions were addressed, and the patient demonstrated understanding.  This note was produced using [employer]. While the provider has reviewed and verified all clinical information, transcription errors may remain.      Final diagnoses:  Left leg pain    ED Discharge Orders          Ordered    HYDROmorphone  (DILAUDID ) 2 MG tablet  Every 4 hours PRN        01/09/24 1541               Ricky Gallery L, [place] 01/14/24 [id number]    Dreama Longs, MD 01/14/24 [phone]

## 2024-01-09 NOTE — Discharge Instructions (Addendum)
 Thank you for visiting the Emergency Department today. It was a pleasure to be part of your healthcare team.  You were seen today for left leg pain, your imaging and laboratory values showed no acute findings.  As discussed, please monitor for any worsening symptoms.  At home, rest, hydrate, will diet, utilize pain management as needed. It is important to watch for warning signs such as worsening pain or fever. If any of these happen, return to the Emergency Department or call 911. Thank you for trusting us  with your health.

## 2024-01-10 ENCOUNTER — Encounter (HOSPITAL_COMMUNITY): Admission: RE | Admit: 2024-01-10 | Source: Ambulatory Visit

## 2024-01-10 ENCOUNTER — Telehealth (HOSPITAL_COMMUNITY): Payer: Self-pay

## 2024-01-10 NOTE — Telephone Encounter (Signed)
 Patient c/o for 1:15 PR class for the week, states she was recently discharged from ED.

## 2024-01-11 ENCOUNTER — Ambulatory Visit: Admitting: Physical Therapy

## 2024-01-12 ENCOUNTER — Other Ambulatory Visit: Payer: Self-pay

## 2024-01-12 ENCOUNTER — Emergency Department (HOSPITAL_COMMUNITY)
Admission: EM | Admit: 2024-01-12 | Discharge: 2024-01-12 | Disposition: A | Discharge status: Home / Self Care | Attending: Emergency Medicine | Admitting: Emergency Medicine

## 2024-01-12 ENCOUNTER — Emergency Department (HOSPITAL_COMMUNITY)

## 2024-01-12 ENCOUNTER — Encounter (HOSPITAL_COMMUNITY)

## 2024-01-12 DIAGNOSIS — Z7901 Long term (current) use of anticoagulants: Secondary | ICD-10-CM | POA: Insufficient documentation

## 2024-01-12 DIAGNOSIS — M79605 Pain in left leg: Secondary | ICD-10-CM | POA: Insufficient documentation

## 2024-01-12 LAB — CBC WITH DIFFERENTIAL/PLATELET
Abs Immature Granulocytes: 0.04 K/uL (ref 0.00–0.07)
Basophils Absolute: 0.1 K/uL (ref 0.0–0.1)
Basophils Relative: 1 %
Eosinophils Absolute: 0.2 K/uL (ref 0.0–0.5)
Eosinophils Relative: 2 %
HCT: 40 % (ref 36.0–46.0)
Hemoglobin: 12.6 g/dL (ref 12.0–15.0)
Immature Granulocytes: 0 %
Lymphocytes Relative: 21 %
Lymphs Abs: 2.1 K/uL (ref 0.7–4.0)
MCH: 27.5 pg (ref 26.0–34.0)
MCHC: 31.5 g/dL (ref 30.0–36.0)
MCV: 87.3 fL (ref 80.0–100.0)
Monocytes Absolute: 0.7 K/uL (ref 0.1–1.0)
Monocytes Relative: 7 %
Neutro Abs: 6.8 K/uL (ref 1.7–7.7)
Neutrophils Relative %: 69 %
Platelets: 335 K/uL (ref 150–400)
RBC: 4.58 MIL/uL (ref 3.87–5.11)
RDW: 15.8 % — ABNORMAL HIGH (ref 11.5–15.5)
WBC: 9.9 K/uL (ref 4.0–10.5)
nRBC: 0 % (ref 0.0–0.2)

## 2024-01-12 LAB — BASIC METABOLIC PANEL WITH GFR
Anion gap: 11 (ref 5–15)
BUN: 27 mg/dL — ABNORMAL HIGH (ref 8–23)
CO2: 29 mmol/L (ref 22–32)
Calcium: 9.9 mg/dL (ref 8.9–10.3)
Chloride: 97 mmol/L — ABNORMAL LOW (ref 98–111)
Creatinine, Ser: 0.8 mg/dL (ref 0.44–1.00)
GFR, Estimated: >60 mL/min (ref >60)
Glucose, Bld: 214 mg/dL — ABNORMAL HIGH (ref 70–99)
Potassium: 4.8 mmol/L (ref 3.5–5.1)
Sodium: 137 mmol/L (ref 135–145)

## 2024-01-12 MED ORDER — HYDROMORPHONE HCL 1 MG/ML IJ SOLN
0.5000 mg | Freq: Once | INTRAMUSCULAR | Status: AC
  Administered 2024-01-12: 0.5 mg via INTRAVENOUS
  Filled 2024-01-12: qty 1

## 2024-01-12 MED ORDER — HYDROMORPHONE HCL 1 MG/ML IJ SOLN: 0.5000 mg | Freq: Once | INTRAMUSCULAR | Status: DC

## 2024-01-12 MED ORDER — HYDROCODONE-ACETAMINOPHEN 5-325 MG PO TABS: 2.0000 | ORAL_TABLET | ORAL | 0 refills | Status: DC | PRN

## 2024-01-12 MED ORDER — PREDNISONE 10 MG PO TABS: 40.0000 mg | ORAL_TABLET | Freq: Every day | ORAL | 0 refills | Status: DC

## 2024-01-12 NOTE — ED Triage Notes (Addendum)
 Patient BIB [facility] from home c/o left leg pain radiating to left foot for 3 weeks. No redness/swelling noted. HX of DVT to right leg about 10 weeks ago, prescribed eliquis , last dose taken last Friday. Patient is alert and oriented x 4. Airway patent, respirations even and unlabored. Skin normal, warm and dry.  Siderails up x 2. Call light at bedside. Seen at [facility] for same problem. Patient prescribed 0.5 dilaudid  for pain which she has been taking.

## 2024-01-12 NOTE — ED Provider Notes (Signed)
 [place] EMERGENCY DEPARTMENT AT [facility] Provider Note   CSN: [id number] Arrival date & time: 01/12/24  1005     Patient presents with: Leg Pain   [name] is a 83 y.o. female.   83 year old female with prior medical history as detailed below presents for evaluation per patient complains of persistent left hip pain.  This has been an ongoing issue for at least 5 to 10 weeks.  She reports that her pain is minimally controlled with oral Dilaudid  at home.   She denies fever.  She denies trauma.  She reports that she was seen for same complaint earlier this week at [facility] ED with no specific findings found on workup.   The history is provided by the patient and medical records.       Prior to Admission medications   Medication Sig Start Date End Date Taking? Authorizing Provider  acetaminophen  (TYLENOL ) 325 MG tablet Take 650 mg by mouth every 6 (six) hours as needed for moderate pain (pain score 4-6) or headache.    [provider]  albuterol  (PROVENTIL  HFA;VENTOLIN  HFA) 108 (90 BASE) MCG/ACT inhaler Inhale 2 puffs into the lungs every 6 (six) hours as needed. For shortness of breath. 03/06/12   Brien Belvie BRAVO, MD  albuterol  (PROVENTIL ) (2.5 MG/3ML) 0.083% nebulizer solution Take 3 mLs (2.5 mg total) by nebulization every 6 (six) hours as needed for wheezing or shortness of breath. 10/19/22   Geofm Glade PARAS, MD  apixaban  (ELIQUIS ) 5 MG TABS tablet Take 1 tablet (5 mg total) by mouth 2 (two) times daily. 11/21/23   Azobou Donley Joelle DEL, MD  Blood Glucose Monitoring Suppl (ACCU-CHEK GUIDE) w/Device KIT 1 kit by Does not apply route as directed. 09/27/23   Geofm Glade PARAS, MD  calcium  carbonate (TUMS - DOSED IN MG ELEMENTAL CALCIUM ) 500 MG chewable tablet Chew 1 tablet by mouth daily as needed for indigestion or heartburn.    [provider]  Cholecalciferol  (VITAMIN D ) 50 MCG (2000 UT) tablet Take 4,000 Units by mouth daily.    [provider]  Continuous Glucose Sensor (FREESTYLE LIBRE 3 PLUS SENSOR) MISC 1 Device by Other route every 14 (fourteen) days. Change sensor every 15 days. 03/17/23   Shamleffer, Ibtehal Jaralla, MD  Cyanocobalamin  (VITAMIN B-12 IJ) Inject 1,000 mcg into the muscle every 30 (thirty) days. Patient not taking: Reported on 12/21/2023    [provider]  divalproex  (DEPAKOTE  ER) 250 MG 24 hr tablet TAKE 1 TABLET (250 MG TOTAL) BY MOUTH AT BEDTIME. Patient not taking: Reported on 12/21/2023 02/03/23   Skeet Juliene SAUNDERS, DO  ezetimibe  (ZETIA ) 10 MG tablet Take 1 tablet (10 mg total) by mouth daily. 11/04/23 02/02/24  Azobou Donley Joelle DEL, MD  fluticasone  (FLONASE ) 50 MCG/ACT nasal spray SPRAY 2 SPRAYS INTO EACH NOSTRIL EVERY DAY 05/03/23   Mannam, Praveen, MD  Fluticasone -Umeclidin-Vilant (TRELEGY ELLIPTA ) 200-62.5-25 MCG/ACT AEPB Inhale 1 puff into the lungs daily at 6 (six) AM. 01/24/23   Burns, Glade PARAS, MD  furosemide  (LASIX ) 20 MG tablet Take 1 tablet (20 mg total) by mouth daily. Take 40 mg for the next 3 days then take 20 mg once a day. 02/21/23   West, Katlyn D, NP  HYDROmorphone  (DILAUDID ) 2 MG tablet Take 0.5 tablets (1 mg total) by mouth every 4 (four) hours as needed for severe pain (pain score 7-10). 01/09/24   Dreama Longs, MD  insulin  lispro protamine-lispro (HUMALOG  75/25 MIX) (75-25) 100 UNIT/ML SUSP  injection Inject 0.06 mLs (6 Units total) into the skin daily with breakfast AND 0.08 mLs (8 Units total) daily with supper. 10 units at lunch depending on bs reading. 03/17/23   Shamleffer, Ibtehal Jaralla, MD  Insulin  Syringe-Needle U-100 (INSULIN  SYRINGE .3CC/29GX1) 29G X 1 0.3 ML MISC 1 Device by Does not apply route in the morning and at bedtime. 03/17/23   Shamleffer, Ibtehal Jaralla, MD  isosorbide  mononitrate (IMDUR ) 60 MG 24 hr tablet Take one tablet ( 60 mg ) twice daily 8 hours apart. Patient not taking: Reported on 11/10/2023 05/14/20   Lelon Hamilton T, PA-C  meclizine  (ANTIVERT )  25 MG tablet Take 25 mg by mouth 2 (two) times daily as needed for dizziness.    [provider]  meloxicam (MOBIC) 15 MG tablet Take 15 mg by mouth daily as needed for pain. 02/12/23   [provider]  metoprolol  succinate (TOPROL -XL) 100 MG 24 hr tablet Take 100 mg by mouth daily. 04/21/23   [provider]  montelukast  (SINGULAIR ) 10 MG tablet Take 1 tablet (10 mg total) by mouth at bedtime. 10/05/23   Mannam, Praveen, MD  Multiple Vitamin (MULTIVITAMIN WITH MINERALS) TABS Take 1 tablet by mouth daily.    [provider]  nitroGLYCERIN  (NITROSTAT ) 0.4 MG SL tablet Place 1 tablet (0.4 mg total) under the tongue every 5 (five) minutes as needed for chest pain. 07/22/21   Claudene Victory ORN, MD  ondansetron  (ZOFRAN ) 4 MG tablet TAKE 1 TABLET BY MOUTH EVERY 8 HOURS AS NEEDED FOR NAUSEA AND VOMITING Patient not taking: Reported on 12/21/2023 12/01/22   Geofm Glade PARAS, MD  pantoprazole  (PROTONIX ) 40 MG tablet TAKE 1 TABLET BY MOUTH EVERY DAY Patient not taking: Reported on 12/21/2023 10/31/23   Geofm Glade PARAS, MD  sertraline  (ZOLOFT ) 100 MG tablet TAKE 1 TABLET BY MOUTH EVERY DAY 04/20/23   Geofm Glade PARAS, MD  spironolactone  (ALDACTONE ) 25 MG tablet TAKE 1 TABLET (25 MG TOTAL) BY MOUTH DAILY. Patient not taking: Reported on 12/21/2023 10/31/23   Geofm Glade PARAS, MD  tiZANidine  (ZANAFLEX ) 2 MG tablet TAKE 1 TO 2 TABLETS (2-4 MG TOTAL) BY MOUTH AT BEDTIME AS NEEDED FOR MUSCLE SPASM 08/24/23   Joshua Debby CROME, MD  Turmeric (QC TUMERIC COMPLEX PO) Take 1 capsule by mouth daily.    [provider]    Allergies: Amlodipine, Banana, Co q10 [coenzyme q10], Codeine, Insulin  aspart (human analog) (yeast), Lisinopril , Metformin  and related, Morphine, Pentazocine, Pravastatin , Repatha [evolocumab], Statins, Sulfa antibiotics, Sulfonamide derivatives, and Tramadol  hcl    Review of Systems  All other systems reviewed and are negative.   Updated Vital Signs BP 127/77   Pulse 70    Temp 98.1 F (36.7 C) (Oral)   Resp 20   LMP  (LMP Unknown)   SpO2 94%   Physical Exam Vitals and nursing note reviewed.  Constitutional:      General: She is not in acute distress.    Appearance: She is well-developed.     Comments: Alert, appears uncomfortable, appears anxious  HENT:     Head: Normocephalic and atraumatic.  Eyes:     Conjunctiva/sclera: Conjunctivae normal.  Cardiovascular:     Rate and Rhythm: Normal rate and regular rhythm.     Heart sounds: No murmur heard. Pulmonary:     Effort: Pulmonary effort is normal. No respiratory distress.     Breath sounds: Normal breath sounds.  Abdominal:     Palpations: Abdomen is soft.  Tenderness: There is no abdominal tenderness.  Musculoskeletal:        General: No swelling.     Cervical back: Neck supple.     Comments: Patient is diffusely tender along the anterior and lateral aspects of the left thigh and hip.  Patient had at least 3 Lidoderm  patches applied across the skin of the left hip and left thigh.  No erythema.  No edema noted.  Distal left lower extremity is neurovasc intact.  Skin:    General: Skin is warm and dry.     Capillary Refill: Capillary refill takes less than 2 seconds.  Neurological:     Mental Status: She is alert.  Psychiatric:        Mood and Affect: Mood normal.     (all labs ordered are listed, but only abnormal results are displayed) Labs Reviewed  CBC WITH DIFFERENTIAL/PLATELET  BASIC METABOLIC PANEL WITH GFR    EKG: None  Radiology: CT Hip Left Wo Contrast Result Date: 01/12/2024 CLINICAL DATA:  Hip pain, stress fracture suspected, neg xray EXAM: CT OF THE LEFT HIP WITHOUT CONTRAST TECHNIQUE: Multidetector CT imaging of the left hip was performed according to the standard protocol. Multiplanar CT image reconstructions were also generated. RADIATION DOSE REDUCTION: This exam was performed according to the departmental dose-optimization program which includes automated exposure  control, adjustment of the mA and/or kV according to patient size and/or use of iterative reconstruction technique. COMPARISON:  Radiographs 01/09/2024 FINDINGS: Technical note: Despite efforts by the technologist and patient, moderate to severe motion artifact is present on today's exam and could not be eliminated. This reduces exam sensitivity and specificity. Patient could not complete the examination due to pain. Bones/Joint/Cartilage Osseous evaluation limited by motion. No obvious acute fracture, dislocation or osteonecrosis. Mild left hip degenerative changes. No evidence of large hip joint effusion. Ligaments Suboptimally assessed by CT. Muscles and Tendons No focal muscular abnormalities are identified. Soft tissues Iliofemoral atherosclerosis. No evidence of periarticular fluid collection, foreign body or soft tissue emphysema. IMPRESSION: 1. Limited examination due to motion artifact. If the patient has persistent unexplained symptoms, follow-up imaging may be warranted. 2. No obvious acute osseous findings. 3. Mild left hip degenerative changes. 4. No evidence of periarticular fluid collection, foreign body or soft tissue emphysema. Electronically Signed   By: Elsie Perone M.D.   On: 01/12/2024 11:14     Procedures   Medications Ordered in the ED  HYDROmorphone  (DILAUDID ) injection 0.5 mg (0.5 mg Intravenous Given 01/12/24 1116)                                    Medical Decision Making Patient with chronic left leg pain.  Patient with recent evaluations for same complaint earlier this week.  No significant acute pathology was identified at that time.  Patient returns now for reevaluation given that she has run out of her previously prescribed Dilaudid .  Repeat workup again is without significant acute abnormality identified.  She is nontoxic in appearance.  She is neurologically intact.  She appears to be appropriate for discharge.  Patient is agreeable to trial of steroids and  continued use of narcotics at home for treatment of chronic pain.  Patient is comfortable with plan to discharge.  She understands need for close outpatient follow-up with both her PCP and also with orthopedics.  Strict return precautions given and understood.  Amount and/or Complexity of Data Reviewed Labs:  ordered. Radiology: ordered.  Risk Prescription drug management.       Final diagnoses:  Left leg pain    ED Discharge Orders          Ordered    predniSONE  (DELTASONE ) 10 MG tablet  Daily        01/12/24 1326    HYDROcodone -acetaminophen  (NORCO/VICODIN) 5-325 MG tablet  Every 4 hours PRN        01/12/24 1326               Laurice Maude BROCKS, MD 01/12/24 1337

## 2024-01-12 NOTE — Progress Notes (Deleted)
 NEUROLOGY FOLLOW UP OFFICE NOTE  HATSUKO BIZZARRO [id number]  Assessment/Plan:   Chronic daily headache - suspect related to hypoxia.   Recommended that she speak with her pulmonologist about needing to use O2 during the day or recheck settings at night *** If ineffective, would retry divaloproex 250mg  daily ***   Subjective:  Princess Karnes is a 83 year old woman with coronary artery disease, COPD, hypertension, type 1 diabetes mellitus, hyperlipidemia, B12 deficiency and obstructive sleep apnea and tension-type headache who follows up for dizziness and headache.   UPDATE: Started using O2 during the day but only when she feels short of breath.  It helps but it is not portable and can't use it outside the home.  Headaches improved but still wakes up with headache  every morning at 5 AM.  They now resolve after at 10-10:30 AM  compared to lasting until noon.  Headaches return when she is getting ready for bed at night.     Intensity:  Moderate to severe Duration:  Wakes up at 5 AM with headache.  Takes Tylenol  and resolves by 11 AM.  However, it returns in the evening. Frequency:  Daily Frequency of abortive medication: Tylenol  every 4 hours daily Current NSAIDS:  ASA 81mg  Current analgesics:  Tylenol  Current triptans:  no Current ergotamine:  no Current anti-emetic:  Promethazine  25mg  Current muscle relaxants:  Baclofen  40mg  twice daily Current anti-anxiolytic:  Klonopin  Current sleep aide:  trazodone  Current Antihypertensive medications:  Toprol  XL 50mg , Cardizem , Lasix , Imdur  Current Antidepressant medications:  none Current Anticonvulsant medications:  none Current anti-CGRP:  no Current Vitamins/Herbal/Supplements:  B12 Current Antihistamines/Decongestants:  meclizine , Allegra , Singulair  Other therapy:  ice pack   Caffeine:  1 cup coffee daily, tea Alcohol:  no Smoker:  no Diet:  hydrates Exercise:  yes Depression:  no; Anxiety:  no Other pain:  no Sleep hygiene:   Poor.  Untreated OSA.   She was taken off of CPAP by her prior sleep specialist because of reported noncompliance.  However, Ms. Heskett insists she wore the mask all night and headaches were improved while she used the machine.     HISTORY: Onset:  She has history of migraines as a young woman which resolved after having children.  Current headaches started in 2016. Location:  She has constant holocephalic headache.  She has severe headache radiating from the base of her neck on the right up Quality:  pounding Initial Intensity:  10/10 Aura:  no Prodrome:  no Postdrome:  no Associated symptoms:  None.  No nausea, vomiting, photophobia, phonophobia or visual disturbance.  She has not had any new worse headache of her life, waking up from sleep Initial Duration:  All day Initial Frequency:  Daily headache, severe  Headache every other day Initial Frequency of abortive medication: daily Triggers:  None Relieving factors:  None Activity:  aggravates  In August 2024, she was admitted to the hospital for hypoxia and altered mental status.  Found to be lethargic and unresponsive.  MRI of brain personally reviewed revealed no acute findings.  Labs negative for infection, ammonia less than 10, TSH and LFTs WNL.  EEG showed diffuse slowing consistent with encephalopathy.  Possible aspiration pneumonia and polypharmacy suspected.  She was found to be significantly hypoxic at night in her sleep.  She now has to sleep with home O2.  Now, she lacks energy.  She wants to sit all of the time.  Activity causes shortness of breath.  Takes all day to clean just  one room.     Past NSAIDS:  Ibuprofen, naproxen (caused increased heart rate) Past analgesics:  Excedrin, tramadol  and other opioids (adverse reaction) Past abortive triptans:  no Past muscle relaxants:  Robaxin , Flexeril  Past anti-emetic:  Promethazine  (effective) Past antihypertensive medications:  losartan  Past antidepressant medications:   nortriptyline  (prolonged QT interval) Past anticonvulsant medications:  Topiramate , Gabapentin  200mg  (drowsiness) Past vitamins/Herbal/Supplements:  CoQ10 Past antihistamines/decongestants:  no Other past therapy:  Physical therapy of neck.   Family history of headache:  father   History of vertigo.  Seen in ED in July 2022.  MRI of brain revealed empty sella but otherwise no acute intracranial abnormality.  Occurs while walking or standing.  Not associated with quick head movements or turning over in bed.  Went to vestibular rehab which was ineffective.    MRI of brain without contrast from 09/16/14 revealed partially empty sella but otherwise unremarkable.   MRI of cervical spine from 09/16/14 demonstrated multilevel degenerative changes including left greater than right spurring at C3-C4 causing left foraminal narrowing, severe spurring at C4-C5 causing right moderate foraminal narrowing, and disc bulg and spurring at C6-C7 with moderate bilateral foraminal narrowing.  She was evaluated by Dr. Bonner for interventional pain (epidural or facet joint injections).  He didn't think it was a good idea given that she would have to stop Plavix .  CT head and c-spine in November 2023 showed arthritis in neck but no acute findings.  PAST MEDICAL HISTORY: Past Medical History:  Diagnosis Date   Adenomatous colon polyp    Allergy     Anxiety    Arthritis    Asthma    Carotid artery disease    carotid US  02/2017: bilat ICA 1-39%   Chronic diastolic CHF    Echo 02/2017: EF 65-70, Gr 2 DD, mild MS (mean 5), PASP 44   COPD    pt is unsure if has been officially diagnosed   Coronary artery disease    CABG '09- cathed 12/09, 9/10, 6/11, 3/14 and 12/13/16- medical Rx // cath 12/2016 - 2/3 grafts patent >> med Rx // Myoview  12/17: low risk   Diabetes mellitus    Dyspnea    with exertion   Gastroesophageal reflux disease    Headache    Hiatal hernia    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    Hyperlipidemia    Hypertension    Myocardial infarction (HCC)    PONV (postoperative nausea and vomiting)    Schatzki's ring    Shoulder injury    resolved after shoulder surgery   Sleep apnea    not on cpap    MEDICATIONS: Current Facility-Administered Medications on File Prior to Visit  Medication Dose Route Frequency Provider Last Rate Last Admin   HYDROmorphone  (DILAUDID ) injection 0.5 mg  0.5 mg Intravenous Once Messick, Peter C, MD       Study - ORION 4 - inclisiran 300 mg/1.56mL or placebo SQ injection (PI-Stuckey)  300 mg Subcutaneous Q6 months    300 mg at 12/21/23 1137   Current Outpatient Medications on File Prior to Visit  Medication Sig Dispense Refill   acetaminophen  (TYLENOL ) 325 MG tablet Take 650 mg by mouth every 6 (six) hours as needed for moderate pain (pain score 4-6) or headache.     albuterol  (PROVENTIL  HFA;VENTOLIN  HFA) 108 (90 BASE) MCG/ACT inhaler Inhale 2 puffs into the lungs every 6 (six) hours as needed. For shortness of breath. 1 Inhaler 0   albuterol  (PROVENTIL ) (  2.5 MG/3ML) 0.083% nebulizer solution Take 3 mLs (2.5 mg total) by nebulization every 6 (six) hours as needed for wheezing or shortness of breath. 360 mL 5   apixaban  (ELIQUIS ) 5 MG TABS tablet Take 1 tablet (5 mg total) by mouth 2 (two) times daily. 180 tablet 3   Blood Glucose Monitoring Suppl (ACCU-CHEK GUIDE) w/Device KIT 1 kit by Does not apply route as directed. 1 kit 0   calcium  carbonate (TUMS - DOSED IN MG ELEMENTAL CALCIUM ) 500 MG chewable tablet Chew 1 tablet by mouth daily as needed for indigestion or heartburn.     Cholecalciferol  (VITAMIN D ) 50 MCG (2000 UT) tablet Take 4,000 Units by mouth daily.     Continuous Glucose Sensor (FREESTYLE LIBRE 3 PLUS SENSOR) MISC 1 Device by Other route every 14 (fourteen) days. Change sensor every 15 days. 6 each 3   Cyanocobalamin  (VITAMIN B-12 IJ) Inject 1,000 mcg into the muscle every 30 (thirty) days. (Patient not taking: Reported on 12/21/2023)      divalproex  (DEPAKOTE  ER) 250 MG 24 hr tablet TAKE 1 TABLET (250 MG TOTAL) BY MOUTH AT BEDTIME. (Patient not taking: Reported on 12/21/2023) 90 tablet 1   ezetimibe  (ZETIA ) 10 MG tablet Take 1 tablet (10 mg total) by mouth daily. 90 tablet 3   fluticasone  (FLONASE ) 50 MCG/ACT nasal spray SPRAY 2 SPRAYS INTO EACH NOSTRIL EVERY DAY 48 mL 3   Fluticasone -Umeclidin-Vilant (TRELEGY ELLIPTA ) 200-62.5-25 MCG/ACT AEPB Inhale 1 puff into the lungs daily at 6 (six) AM. 60 each 5   furosemide  (LASIX ) 20 MG tablet Take 1 tablet (20 mg total) by mouth daily. Take 40 mg for the next 3 days then take 20 mg once a day. 93 tablet 3   HYDROcodone -acetaminophen  (NORCO/VICODIN) 5-325 MG tablet Take 2 tablets by mouth every 4 (four) hours as needed. 12 tablet 0   HYDROmorphone  (DILAUDID ) 2 MG tablet Take 0.5 tablets (1 mg total) by mouth every 4 (four) hours as needed for severe pain (pain score 7-10). 30 tablet 0   insulin  lispro protamine-lispro (HUMALOG  75/25 MIX) (75-25) 100 UNIT/ML SUSP injection Inject 0.06 mLs (6 Units total) into the skin daily with breakfast AND 0.08 mLs (8 Units total) daily with supper. 10 units at lunch depending on bs reading. 15 mL 3   Insulin  Syringe-Needle U-100 (INSULIN  SYRINGE .3CC/29GX1) 29G X 1 0.3 ML MISC 1 Device by Does not apply route in the morning and at bedtime. 200 each 3   isosorbide  mononitrate (IMDUR ) 60 MG 24 hr tablet Take one tablet ( 60 mg ) twice daily 8 hours apart. (Patient not taking: Reported on 11/10/2023) 180 tablet 3   meclizine  (ANTIVERT ) 25 MG tablet Take 25 mg by mouth 2 (two) times daily as needed for dizziness.     meloxicam (MOBIC) 15 MG tablet Take 15 mg by mouth daily as needed for pain.     metoprolol  succinate (TOPROL -XL) 100 MG 24 hr tablet Take 100 mg by mouth daily.     montelukast  (SINGULAIR ) 10 MG tablet Take 1 tablet (10 mg total) by mouth at bedtime. 30 tablet 11   Multiple Vitamin (MULTIVITAMIN WITH MINERALS) TABS Take 1 tablet by mouth daily.      nitroGLYCERIN  (NITROSTAT ) 0.4 MG SL tablet Place 1 tablet (0.4 mg total) under the tongue every 5 (five) minutes as needed for chest pain. 25 tablet 3   ondansetron  (ZOFRAN ) 4 MG tablet TAKE 1 TABLET BY MOUTH EVERY 8 HOURS AS NEEDED FOR NAUSEA AND VOMITING (Patient  not taking: Reported on 12/21/2023) 20 tablet 1   pantoprazole  (PROTONIX ) 40 MG tablet TAKE 1 TABLET BY MOUTH EVERY DAY (Patient not taking: Reported on 12/21/2023) 90 tablet 2   predniSONE  (DELTASONE ) 10 MG tablet Take 4 tablets (40 mg total) by mouth daily. 16 tablet 0   sertraline  (ZOLOFT ) 100 MG tablet TAKE 1 TABLET BY MOUTH EVERY DAY 90 tablet 2   spironolactone  (ALDACTONE ) 25 MG tablet TAKE 1 TABLET (25 MG TOTAL) BY MOUTH DAILY. (Patient not taking: Reported on 12/21/2023) 90 tablet 1   tiZANidine  (ZANAFLEX ) 2 MG tablet TAKE 1 TO 2 TABLETS (2-4 MG TOTAL) BY MOUTH AT BEDTIME AS NEEDED FOR MUSCLE SPASM 180 tablet 0   Turmeric (QC TUMERIC COMPLEX PO) Take 1 capsule by mouth daily.      ALLERGIES: Allergies  Allergen Reactions   Amlodipine Other (See Comments)    hallucinations    Banana Nausea And Vomiting    Stomach pumped   Co Q10 [Coenzyme Q10] Other (See Comments)    Body cramps   Codeine Other (See Comments)    Hallucinate, loose identity and don't know who I am   Insulin  Aspart (Human Analog) (Yeast) Other (See Comments)    Unknown   Lisinopril  Other (See Comments)    nose bleed   Metformin  And Related Nausea And Vomiting   Morphine Other (See Comments)    Can not function, it immobilizes me    Pentazocine Nausea And Vomiting   Pravastatin  Other (See Comments)    Hands locked up   Repatha [Evolocumab] Other (See Comments)    myalgias   Statins Other (See Comments)    Muscle cramps   Sulfa Antibiotics Swelling   Sulfonamide Derivatives Swelling   Tramadol  Hcl Other (See Comments)    Dizziness, cant function     FAMILY HISTORY: Family History  Problem Relation Age of Onset   Heart disease Maternal  Grandfather    Heart failure Maternal Grandfather    Diabetes Maternal Grandfather    Heart attack Father    Diabetes Mother    Rheum arthritis Sister    Emphysema Paternal Uncle    Esophageal cancer Brother 44       she said he was born with it   Emphysema Paternal Aunt    Healthy Child    Neuropathy Neg Hx    Multiple sclerosis Neg Hx    Colon cancer Neg Hx    Colon polyps Neg Hx    Rectal cancer Neg Hx    Stomach cancer Neg Hx       Objective:  *** General: No acute distress.  Patient appears well-groomed.   Head:  Normocephalic/atraumatic Neck:  Supple.  No paraspinal tenderness.  Full range of motion. Heart:  Regular rate and rhythm. Neuro:  Alert and oriented.  Speech fluent and not dysarthric.  Language intact.  CN II-XII intact.  Bulk and tone normal.  Muscle strength 5/5 throughout.  Sensation to light touch intact.  Deep tendon reflexes 2+ throughout, toes downgoing.  Gait normal.  Romberg negative. ***    Juliene Dunnings, DO  CC: Glade Hope, MD

## 2024-01-12 NOTE — ED Notes (Signed)
 Spoke with patient's daughter Katheryn who gave me cousins number to call for a ride home for patient. LVM for [facility] @ [phone]

## 2024-01-12 NOTE — Discharge Instructions (Signed)
 Return for any problem.  ?

## 2024-01-13 ENCOUNTER — Telehealth (HOSPITAL_COMMUNITY): Payer: Self-pay | Admitting: *Deleted

## 2024-01-13 NOTE — Telephone Encounter (Signed)
 Amariyana returned my call. She has been dealing with leg pain and visited her MD today. She sts currently her pain is better and she would like to try to come to class next week. Will assess next week. Aliene Aris BS, ACSM-CEP 01/13/2024 3:50 PM

## 2024-01-16 ENCOUNTER — Ambulatory Visit: Admitting: Neurology

## 2024-01-16 ENCOUNTER — Encounter: Payer: Self-pay | Admitting: Neurology

## 2024-01-17 ENCOUNTER — Encounter (HOSPITAL_COMMUNITY)
Admission: RE | Admit: 2024-01-17 | Discharge: 2024-01-17 | Disposition: A | Discharge status: Home / Self Care | Source: Ambulatory Visit | Attending: Pulmonary Disease | Admitting: Pulmonary Disease

## 2024-01-17 VITALS — Wt 177.0 lb

## 2024-01-17 DIAGNOSIS — J449 Chronic obstructive pulmonary disease, unspecified: Secondary | ICD-10-CM | POA: Insufficient documentation

## 2024-01-17 LAB — GLUCOSE, CAPILLARY: Glucose-Capillary: 187 mg/dL — ABNORMAL HIGH (ref 70–99)

## 2024-01-17 NOTE — Progress Notes (Signed)
 Daily Session Note  Patient Details  Name: [name] MRN: [id number] Date of Birth: [birth year] Referring Provider:   [facility] Walk Test from 11/07/2023 in [facility]  Referring Provider Dr. Lonna Coder    Encounter Date: 01/17/2024  Check In:  Session Check In - 01/17/24 1327       Check-In   Supervising physician immediately available to respond to emergencies [facility] MD immediately available    Physician(s) Rosaline Skains, NP    Location [facility]    Staff Present Ronal Levin, RN, BSN;Casey Claudene, RT;Randi [facility] BS, ACSM-CEP, Exercise Physiologist;Kaylee Nicholaus, MS, ACSM-CEP, Exercise Physiologist    Virtual Visit No    Medication changes reported     No    Fall or balance concerns reported    No    Tobacco Cessation No Change    Warm-up and Cool-down Performed as group-led instruction    Resistance Training Performed Yes    VAD Patient? No    PAD/SET Patient? No      Pain Assessment   Currently in Pain? No/denies    Multiple Pain Sites No          Capillary Blood Glucose: Results for orders placed or performed during the hospital encounter of 01/17/24 (from the past 24 hours)  Glucose, capillary     Status: Abnormal   Collection Time: 01/17/24  2:49 PM  Result Value Ref Range   Glucose-Capillary 187 (H) 70 - 99 mg/dL   *Note: Due to a large number of results and/or encounters for the requested time period, some results have not been displayed. A complete set of results can be found in Results Review.     Exercise Prescription Changes - 01/17/24 1500       Response to Exercise   Blood Pressure (Admit) 134/82    Blood Pressure (Exercise) 138/82    Blood Pressure (Exit) 132/70    Heart Rate (Admit) 97 bpm    Heart Rate (Exercise) 104 bpm    Heart Rate (Exit) 97 bpm    Oxygen  Saturation (Admit) 97 %    Oxygen  Saturation (Exercise) 94 %    Oxygen  Saturation  (Exit) 94 %    Rating of Perceived Exertion (Exercise) 17    Perceived Dyspnea (Exercise) 3    Duration Continue with 30 min of aerobic exercise without signs/symptoms of physical distress.    Intensity THRR unchanged      Progression   Progression Continue to progress workloads to maintain intensity without signs/symptoms of physical distress.      Resistance Training   Training Prescription Yes    Weight red bands    Reps 10-15    Time 10 Minutes      Recumbant Bike   Level 2    Minutes 15    METs 1.9      NuStep   Level 2    SPM 93    Minutes 15    METs 2          Social History   Tobacco Use  Smoking Status Former   Current packs/day: 0.00   Average packs/day: 0.5 packs/day for 21.0 years (10.5 ttl pk-yrs)   Types: Cigarettes   Start date: 02/09/1955   Quit date: 02/09/1976   Years since quitting: 47.9  Smokeless Tobacco Never    Goals Met:  Queuing for purse lip breathing No report of concerns or symptoms today Strength  training completed today  Goals Unmet:  Pain in right leg  Comments: Service time is from 1315 to 1442    Dr. Slater Staff is Medical Director for Pulmonary Rehab at [facility].

## 2024-01-18 ENCOUNTER — Ambulatory Visit: Admitting: Physical Therapy

## 2024-01-18 ENCOUNTER — Telehealth: Payer: Self-pay | Admitting: Physical Therapy

## 2024-01-18 NOTE — Telephone Encounter (Signed)
 Left voicemail regarding missed appointment. Left next appointment time and requested call back if needs to reschedule.

## 2024-01-19 ENCOUNTER — Encounter (HOSPITAL_COMMUNITY): Admission: RE | Admit: 2024-01-19 | Source: Ambulatory Visit

## 2024-01-24 ENCOUNTER — Encounter (HOSPITAL_COMMUNITY): Admission: RE | Admit: 2024-01-24

## 2024-01-24 ENCOUNTER — Telehealth: Payer: Self-pay

## 2024-01-24 NOTE — Telephone Encounter (Signed)
 Patient returned RN's call.

## 2024-01-24 NOTE — Telephone Encounter (Signed)
 Call transferred to triage nurse.  Patient reports increased swelling to feet and legs over the past 2 weeks.  She states she always has SOB, but it is not any worse than usual since the swelling started. She denies any swelling/fullness in abdomen.  Patient denies increased intake of salt in diet.  She states she is taking Lasix  20 mg every other day at the direction of her PCP from the summer time. Our records show patient is to be taking Lasix  20 mg daily. Instructed patient to take Lasix  daily as prescribed. Encouraged patient to elevate her feet when sitting. Patient verbalized understanding.  Will forward to Dr. Azobou for review and further advisement.

## 2024-01-24 NOTE — Telephone Encounter (Signed)
 Left message for patient to call back

## 2024-01-24 NOTE — Telephone Encounter (Signed)
 Pt c/o swelling/edema: STAT if pt has developed SOB within 24 hours  If swelling, where is the swelling located?   Both feet  How much weight have you gained and in what time span?   No  Have you gained 2 pounds in a day or 5 pounds in a week?   Do you have a log of your daily weights (if so, list)?   174 - 12/12 172   171 162  Are you currently taking a fluid pill?   Yes - every other day  Are you currently SOB?   Patient stated she regularly has SOB  Have you traveled recently in a car or plane for an extended period of time?   No  Patient is concerned she has been doing Pulmonary exercises and her feet has been swelling.  Patient wants advice on next steps.

## 2024-01-25 ENCOUNTER — Telehealth (HOSPITAL_COMMUNITY): Payer: Self-pay

## 2024-01-25 ENCOUNTER — Ambulatory Visit: Admitting: Physical Therapy

## 2024-01-25 ENCOUNTER — Telehealth: Payer: Self-pay | Admitting: Physical Therapy

## 2024-01-25 NOTE — Telephone Encounter (Signed)
 Spoke with patient and shared response from Dr. Ren:  Ren Ny, Joelle DEL, MD     01/25/24 10:20 AM Hello,      I agree with the daily dosing of lasix  for now. She can let us  know in a week or so if she notices any changes with that new regimen.   Thank you Joelle  Patient verbalized understanding and expressed appreciation for call.

## 2024-01-25 NOTE — Telephone Encounter (Signed)
 Called pt to check on her since she has missed a couple of PR classes. No answer. Left VM.

## 2024-01-25 NOTE — Telephone Encounter (Signed)
 Left voicemail regarding another missed PT appointment. Left reminder of attendance policy and canceled remaining appointment. She was asked to call back to the clinic to schedule or have her doctor refer her back as appropriate.

## 2024-01-25 NOTE — Telephone Encounter (Signed)
 [name] returned call and stated her legs are more swollen and she is having increased pain. She is working with her MD about the issues. She stated she will hopefully be back to class next week. Cancelled pt's appt for [facility] tomorrow.

## 2024-01-26 ENCOUNTER — Encounter (HOSPITAL_COMMUNITY)

## 2024-01-27 ENCOUNTER — Other Ambulatory Visit: Payer: Self-pay | Admitting: Internal Medicine

## 2024-01-28 ENCOUNTER — Other Ambulatory Visit: Payer: Self-pay | Admitting: Internal Medicine

## 2024-01-31 ENCOUNTER — Encounter (HOSPITAL_COMMUNITY): Admission: RE | Admit: 2024-01-31

## 2024-02-01 ENCOUNTER — Telehealth (HOSPITAL_COMMUNITY): Payer: Self-pay

## 2024-02-01 NOTE — Telephone Encounter (Signed)
 Spoke to [name] about missing Pulm Rehab for pain in her leg. [name] stated she is scheduled for an MRI on the 30th. She stated her MD does want her to continue exercise if she can tolerate the pain. Will await results of the MRI.

## 2024-02-01 NOTE — Progress Notes (Signed)
 Pulmonary Individual Treatment Plan  Patient Details  Name: [name] MRN: [id number] Date of Birth: 1940/06/27 Referring Provider:   Conrad Ports Pulmonary Rehab Walk Test from 11/07/2023 in [facility]  Referring Provider Dr. Lonna Coder    Initial Encounter Date:  Flowsheet Row Pulmonary Rehab Walk Test from 11/07/2023 in [facility]  Date 11/07/23    Visit Diagnosis: Stage 2 moderate COPD by GOLD classification [facility])  Patient's Home Medications on Admission:  Current Medications[1]  Past Medical History: Past Medical History:  Diagnosis Date   Adenomatous colon polyp    Allergy     Anxiety    Arthritis    Asthma    Carotid artery disease    carotid US  02/2017: bilat ICA 1-39%   Chronic diastolic CHF    Echo 02/2017: EF 65-70, Gr 2 DD, mild MS (mean 5), PASP 44   COPD    pt is unsure if has been officially diagnosed   Coronary artery disease    CABG '09- cathed 12/09, 9/10, 6/11, 3/14 and 12/13/16- medical Rx // cath 12/2016 - 2/3 grafts patent >> med Rx // Myoview  12/17: low risk   Diabetes mellitus    Dyspnea    with exertion   Gastroesophageal reflux disease    Headache    Hiatal hernia    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8  9.0  - -   PH, Arterial 7.35 - 7.45 - - - - 7.351   PCO2 arterial 32 - 48 mmHg - - - - 51.1   Bicarbonate 20.0 - 28.0 mmol/L - - - - 28.3  28.2   TCO2 22 - 32 mmol/L - - - - 30  30   O2 Saturation % - - - - 85  59     Details       Multiple values from one day are sorted in reverse-chronological order         Capillary Blood Glucose: Lab Results  Component Value Date   GLUCAP 187 (H) 01/17/2024   GLUCAP 89 01/03/2024   GLUCAP 119 (H) 12/29/2023   GLUCAP 118 (H) 12/27/2023   GLUCAP 84 12/22/2023     Pulmonary Assessment Scores:  Pulmonary Assessment Scores     Row Name 11/07/23 1345         ADL UCSD   ADL Phase Entry     SOB Score total 88       CAT Score   CAT Score 29       mMRC Score  mMRC Score 3       UCSD: Self-administered rating of dyspnea associated with activities of daily living (ADLs) 6-point scale (0 = not at all to 5 = maximal or unable to do because of breathlessness)  Scoring Scores range from 0 to 120.  Minimally important difference is 5 units  CAT: CAT can identify the health impairment of COPD patients and is better correlated with disease progression.  CAT has a scoring range of zero to 40. The CAT score is classified into four groups of low (less than 10), medium (10 - 20), high (21-30) and very high (31-40) based on the impact level of disease on health status. A CAT score over 10 suggests significant symptoms.  A worsening CAT score could be explained by an exacerbation, poor medication adherence, poor inhaler technique, or progression of COPD or comorbid conditions.  CAT MCID is 2 points  mMRC: mMRC (Modified Medical Research Council) Dyspnea Scale is used to assess the degree of baseline functional disability in patients of respiratory disease due to dyspnea. No minimal important difference is established. A decrease in score of 1 point or greater is considered a positive change.   Pulmonary Function Assessment:  Pulmonary Function  Assessment - 11/07/23 1523       Breath   Bilateral Breath Sounds Decreased    Shortness of Breath Yes;Fear of Shortness of Breath;Limiting activity          Exercise Target Goals: Exercise Program Goal: Individual exercise prescription set using results from initial 6 min walk test and THRR while considering  patients activity barriers and safety.   Exercise Prescription Goal: Initial exercise prescription builds to 30-45 minutes a day of aerobic activity, 2-3 days per week.  Home exercise guidelines will be given to patient during program as part of exercise prescription that the participant will acknowledge.  Activity Barriers & Risk Stratification:  Activity Barriers & Cardiac Risk Stratification - 11/07/23 1343       Activity Barriers & Cardiac Risk Stratification   Activity Barriers Deconditioning;Muscular Weakness;Shortness of Breath;Balance Concerns;History of Falls;Other (comment)    Comments bilateral shoulder replacements          6 Minute Walk:  6 Minute Walk     Row Name 11/07/23 1517         6 Minute Walk   Phase Initial     Distance 1080 feet     Walk Time 6 minutes     # of Rest Breaks 2  1:52-2:25, 4:51-5:21     MPH 2.05     METS 1.5     RPE 15     Perceived Dyspnea  3     VO2 Peak 5.26     Resting HR 50 bpm     Resting BP 132/64     Resting Oxygen  Saturation  95 %     Exercise Oxygen  Saturation  during 6 min walk 86 %  O2 dropped to 86%, stopped pt and O2 recovered within 10 seconds     Max Ex. HR 92 bpm     Max Ex. BP 170/74     2 Minute Post BP 140/70       Interval HR   1 Minute HR 57     2 Minute HR 78     3 Minute HR 79     4 Minute HR 81     5 Minute HR 92     6 Minute HR 86     2  Minute Post HR 62     Interval Heart Rate? Yes       Interval Oxygen    Interval Oxygen ? Yes     Baseline Oxygen  Saturation % 95 %     1 Minute Oxygen  Saturation % 99 %     1 Minute Liters of Oxygen  0 L     2 Minute Oxygen  Saturation % 94 %     2  Minute Liters of Oxygen  0 L     3 Minute Oxygen  Saturation % 90 %     3 Minute Liters of Oxygen  0 L     4 Minute Oxygen  Saturation % 87 %  stopped pt and O2 recovered w/in 10 seconds     4 Minute Liters of Oxygen  0 L     5 Minute Oxygen  Saturation % 86 %  stopped pt and O2 recovered w/in 10 seconds     5 Minute Liters of Oxygen  0 L     6 Minute Oxygen  Saturation % 91 %     6 Minute Liters of Oxygen  0 L     2 Minute Post Oxygen  Saturation % 97 %     2 Minute Post Liters of Oxygen  0 L        Oxygen  Initial Assessment:  Oxygen  Initial Assessment - 11/07/23 1345       Home Oxygen    Home Oxygen  Device None    Sleep Oxygen  Prescription None    Home Exercise Oxygen  Prescription None    Home Resting Oxygen  Prescription None      Initial 6 min Walk   Oxygen  Used None      Program Oxygen  Prescription   Program Oxygen  Prescription None      Intervention   Short Term Goals To learn and exhibit compliance with exercise, home and travel O2 prescription;To learn and understand importance of maintaining oxygen  saturations>88%;To learn and demonstrate proper use of respiratory medications;To learn and demonstrate proper pursed lip breathing techniques or other breathing techniques. ;To learn and understand importance of monitoring SPO2 with pulse oximeter and demonstrate accurate use of the pulse oximeter.    Long  Term Goals Verbalizes importance of monitoring SPO2 with pulse oximeter and return demonstration;Exhibits proper breathing techniques, such as pursed lip breathing or other method taught during program session;Demonstrates proper use of MDIs;Maintenance of O2 saturations>88%;Compliance with respiratory medication;Exhibits compliance with exercise, home  and travel O2 prescription          Oxygen  Re-Evaluation:  Oxygen  Re-Evaluation     Row Name 11/07/23 1547 12/01/23 0910 12/29/23 0955 01/27/24 1333       Program Oxygen  Prescription   Program Oxygen  Prescription None None None  None      Home Oxygen    Home Oxygen  Device None None None None    Sleep Oxygen  Prescription None None None None    Home Exercise Oxygen  Prescription None None None None    Home Resting Oxygen  Prescription None None None None      Goals/Expected Outcomes   Short Term Goals To learn and exhibit compliance with exercise, home and travel O2 prescription;To learn and understand importance of maintaining oxygen  saturations>88%;To learn and demonstrate proper use of respiratory medications;To learn and demonstrate proper pursed lip breathing techniques or other breathing techniques. ;To learn and understand importance of monitoring SPO2 with pulse oximeter and demonstrate accurate use of the pulse oximeter. To learn and exhibit compliance with exercise, home and travel O2 prescription;To learn and understand importance of maintaining oxygen  saturations>88%;To  learn and demonstrate proper use of respiratory medications;To learn and demonstrate proper pursed lip breathing techniques or other breathing techniques. ;To learn and understand importance of monitoring SPO2 with pulse oximeter and demonstrate accurate use of the pulse oximeter. To learn and exhibit compliance with exercise, home and travel O2 prescription;To learn and understand importance of maintaining oxygen  saturations>88%;To learn and demonstrate proper use of respiratory medications;To learn and demonstrate proper pursed lip breathing techniques or other breathing techniques. ;To learn and understand importance of monitoring SPO2 with pulse oximeter and demonstrate accurate use of the pulse oximeter. To learn and exhibit compliance with exercise, home and travel O2 prescription;To learn and understand importance of maintaining oxygen  saturations>88%;To learn and demonstrate proper use of respiratory medications;To learn and demonstrate proper pursed lip breathing techniques or other breathing techniques. ;To learn and understand importance of  monitoring SPO2 with pulse oximeter and demonstrate accurate use of the pulse oximeter.    Long  Term Goals Verbalizes importance of monitoring SPO2 with pulse oximeter and return demonstration;Exhibits proper breathing techniques, such as pursed lip breathing or other method taught during program session;Demonstrates proper use of MDIs;Maintenance of O2 saturations>88%;Compliance with respiratory medication;Exhibits compliance with exercise, home  and travel O2 prescription Verbalizes importance of monitoring SPO2 with pulse oximeter and return demonstration;Exhibits proper breathing techniques, such as pursed lip breathing or other method taught during program session;Demonstrates proper use of MDIs;Maintenance of O2 saturations>88%;Compliance with respiratory medication;Exhibits compliance with exercise, home  and travel O2 prescription Verbalizes importance of monitoring SPO2 with pulse oximeter and return demonstration;Exhibits proper breathing techniques, such as pursed lip breathing or other method taught during program session;Demonstrates proper use of MDIs;Maintenance of O2 saturations>88%;Compliance with respiratory medication;Exhibits compliance with exercise, home  and travel O2 prescription Verbalizes importance of monitoring SPO2 with pulse oximeter and return demonstration;Exhibits proper breathing techniques, such as pursed lip breathing or other method taught during program session;Demonstrates proper use of MDIs;Maintenance of O2 saturations>88%;Compliance with respiratory medication;Exhibits compliance with exercise, home  and travel O2 prescription    Goals/Expected Outcomes Compliance and understanding of oxygen  saturation monitoring and breathing techniques to decrease shortness of breath. Compliance and understanding of oxygen  saturation monitoring and breathing techniques to decrease shortness of breath. Compliance and understanding of oxygen  saturation monitoring and breathing  techniques to decrease shortness of breath. Compliance and understanding of oxygen  saturation monitoring and breathing techniques to decrease shortness of breath.       Oxygen  Discharge (Final Oxygen  Re-Evaluation):  Oxygen  Re-Evaluation - 01/27/24 1333       Program Oxygen  Prescription   Program Oxygen  Prescription None      Home Oxygen    Home Oxygen  Device None    Sleep Oxygen  Prescription None    Home Exercise Oxygen  Prescription None    Home Resting Oxygen  Prescription None      Goals/Expected Outcomes   Short Term Goals To learn and exhibit compliance with exercise, home and travel O2 prescription;To learn and understand importance of maintaining oxygen  saturations>88%;To learn and demonstrate proper use of respiratory medications;To learn and demonstrate proper pursed lip breathing techniques or other breathing techniques. ;To learn and understand importance of monitoring SPO2 with pulse oximeter and demonstrate accurate use of the pulse oximeter.    Long  Term Goals Verbalizes importance of monitoring SPO2 with pulse oximeter and return demonstration;Exhibits proper breathing techniques, such as pursed lip breathing or other method taught during program session;Demonstrates proper use of MDIs;Maintenance of O2 saturations>88%;Compliance with respiratory medication;Exhibits compliance with exercise, home  and travel  O2 prescription    Goals/Expected Outcomes Compliance and understanding of oxygen  saturation monitoring and breathing techniques to decrease shortness of breath.          Initial Exercise Prescription:  Initial Exercise Prescription - 11/07/23 1500       Date of Initial Exercise RX and Referring Provider   Date 11/07/23    Referring Provider Dr. Praveen Mannam    Expected Discharge Date 02/14/24      Recumbant Bike   Level 1    Minutes 15    METs 1.2      NuStep   Level 1    SPM 60    Minutes 15    METs 1.4      Prescription Details   Frequency (times  per week) 2    Duration Progress to 30 minutes of continuous aerobic without signs/symptoms of physical distress      Intensity   THRR 40-80% of Max Heartrate 54-108    Ratings of Perceived Exertion 11-13    Perceived Dyspnea 0-4      Progression   Progression Continue to progress workloads to maintain intensity without signs/symptoms of physical distress.      Resistance Training   Training Prescription Yes    Weight red bands    Reps 10-15          Perform Capillary Blood Glucose checks as needed.  Exercise Prescription Changes:   Exercise Prescription Changes     Row Name 11/17/23 1528 12/06/23 1500 12/20/23 1500 01/03/24 1500 01/17/24 1500     Response to Exercise   Blood Pressure (Admit) 150/70 112/62 132/68 105/54 134/82   Blood Pressure (Exercise) 154/72 142/72 142/64 124/70 138/82   Blood Pressure (Exit) 128/74 158/80 120/60 120/60 132/70   Heart Rate (Admit) 62 bpm 75 bpm 66 bpm 62 bpm 97 bpm   Heart Rate (Exercise) 78 bpm 78 bpm 66 bpm 78 bpm 104 bpm   Heart Rate (Exit) 67 bpm 69 bpm 64 bpm 66 bpm 97 bpm   Oxygen  Saturation (Admit) 100 % 97 % 100 % 98 % 97 %   Oxygen  Saturation (Exercise) 98 % 96 % 94 % 95 % 94 %   Oxygen  Saturation (Exit) 94 % 100 % 97 % 96 % 94 %   Rating of Perceived Exertion (Exercise) 14 14 10 15 17    Perceived Dyspnea (Exercise) 1 1 1 1 3    Duration Continue with 30 min of aerobic exercise without signs/symptoms of physical distress. Continue with 30 min of aerobic exercise without signs/symptoms of physical distress. Continue with 30 min of aerobic exercise without signs/symptoms of physical distress. Continue with 30 min of aerobic exercise without signs/symptoms of physical distress. Continue with 30 min of aerobic exercise without signs/symptoms of physical distress.   Intensity THRR unchanged THRR unchanged THRR unchanged THRR unchanged THRR unchanged     Progression   Progression Continue to progress workloads to maintain intensity  without signs/symptoms of physical distress. Continue to progress workloads to maintain intensity without signs/symptoms of physical distress. Continue to progress workloads to maintain intensity without signs/symptoms of physical distress. Continue to progress workloads to maintain intensity without signs/symptoms of physical distress. Continue to progress workloads to maintain intensity without signs/symptoms of physical distress.     Resistance Training   Training Prescription Yes Yes Yes Yes Yes   Weight red bands red bands red bands red bands red bands   Reps 10-15 10-15 10-15 10-15 10-15   Time 10 Minutes 10  Minutes 10 Minutes 10 Minutes 10 Minutes     Recumbant Bike   Level 1 1 2 2 2    RPM -- -- 45 60 --   Watts -- -- 14 10 --   Minutes 15 15 15 15 15    METs 1.7 1.8 1.9 1.9 1.9     NuStep   Level 1 1 2 1   decreased from level 2 to 1 2   SPM 91 87 98 81 93   Minutes 15 15 15 15 15    METs 2.2 2.2 2.2 1.9 2      Exercise Comments:   Exercise Comments     Row Name 11/17/23 1523           Exercise Comments Pt completed first day of group exercise. She exercised on the recumbent stepper for 15 min, level 1, METS 2.2. She then exercised on the recumbent stepper for 15 min, level 1, METs 1.7. She performed warm up and cool down standing, including squats. Using red bands. Discussed METs.          Exercise Goals and Review:   Exercise Goals     Row Name 11/07/23 1343             Exercise Goals   Increase Physical Activity Yes       Intervention Develop an individualized exercise prescription for aerobic and resistive training based on initial evaluation findings, risk stratification, comorbidities and participant's personal goals.;Provide advice, education, support and counseling about physical activity/exercise needs.       Expected Outcomes Short Term: Attend rehab on a regular basis to increase amount of physical activity.;Long Term: Exercising regularly at least 3-5  days a week.;Long Term: Add in home exercise to make exercise part of routine and to increase amount of physical activity.       Increase Strength and Stamina Yes       Intervention Provide advice, education, support and counseling about physical activity/exercise needs.;Develop an individualized exercise prescription for aerobic and resistive training based on initial evaluation findings, risk stratification, comorbidities and participant's personal goals.       Expected Outcomes Short Term: Increase workloads from initial exercise prescription for resistance, speed, and METs.;Short Term: Perform resistance training exercises routinely during rehab and add in resistance training at home;Long Term: Improve cardiorespiratory fitness, muscular endurance and strength as measured by increased METs and functional capacity ( )       Able to understand and use rate of perceived exertion (RPE) scale Yes       Intervention Provide education and explanation on how to use RPE scale       Expected Outcomes Short Term: Able to use RPE daily in rehab to express subjective intensity level;Long Term:  Able to use RPE to guide intensity level when exercising independently       Able to understand and use Dyspnea scale Yes       Intervention Provide education and explanation on how to use Dyspnea scale       Expected Outcomes Short Term: Able to use Dyspnea scale daily in rehab to express subjective sense of shortness of breath during exertion;Long Term: Able to use Dyspnea scale to guide intensity level when exercising independently       Knowledge and understanding of Target Heart Rate Range (THRR) Yes       Intervention Provide education and explanation of THRR including how the numbers were predicted and where they are located for reference  Expected Outcomes Short Term: Able to state/look up THRR;Short Term: Able to use daily as guideline for intensity in rehab;Long Term: Able to use THRR to govern intensity  when exercising independently       Understanding of Exercise Prescription Yes       Intervention Provide education, explanation, and written materials on patient's individual exercise prescription       Expected Outcomes Short Term: Able to explain program exercise prescription;Long Term: Able to explain home exercise prescription to exercise independently          Exercise Goals Re-Evaluation :  Exercise Goals Re-Evaluation     Row Name 11/07/23 1545 12/01/23 0908 12/29/23 0949 01/27/24 1330       Exercise Goal Re-Evaluation   Exercise Goals Review Increase Physical Activity;Able to understand and use Dyspnea scale;Understanding of Exercise Prescription;Increase Strength and Stamina;Knowledge and understanding of Target Heart Rate Range (THRR);Able to understand and use rate of perceived exertion (RPE) scale Increase Physical Activity;Able to understand and use Dyspnea scale;Understanding of Exercise Prescription;Increase Strength and Stamina;Knowledge and understanding of Target Heart Rate Range (THRR);Able to understand and use rate of perceived exertion (RPE) scale Increase Physical Activity;Able to understand and use Dyspnea scale;Understanding of Exercise Prescription;Increase Strength and Stamina;Knowledge and understanding of Target Heart Rate Range (THRR);Able to understand and use rate of perceived exertion (RPE) scale Increase Physical Activity;Able to understand and use Dyspnea scale;Understanding of Exercise Prescription;Increase Strength and Stamina;Knowledge and understanding of Target Heart Rate Range (THRR);Able to understand and use rate of perceived exertion (RPE) scale    Comments Pt to begin exercise next week. Will monitor for progression. Pt has completed 2 exercise sessions, missing 3 for illness. She is exercising on the recumbent stepper for 15 min, level 1, METs 2.3. She then is exercising on the recumbent bike for 15 min, level 1, METs 1.8. She has not had enough time to  begin progressing. She performs warm up and cool down standing with support, using red bands, 3.7 lbs. Will monitor for progression. Pt has completed 9 exercise sessions, missing 3 for illness. She is exercising on the recumbent stepper for 15 min, level 2, METs 2.4. She then is exercising on the recumbent bike for 15 min, level 2, METs 2. She is slowly progressing. She performs warm up and cool down standing with support, using red bands, 3.7 lbs. Will monitor for progression. Pt has completed 12 exercise sessions. She has missed 5 of the last 6 sessions due to ongoing left leg pain that is being evaluated.  She has not increased level or METs since the last evaluation. She is exercising on the recumbent stepper for 15 min, level 2, METs 2.4. She then is exercising on the recumbent bike for 15 min, level 2, METs 2. Will progress as able however she might have to terminate PR due to her pain.    Expected Outcomes Through exercise at rehab and home, the patient will decrease shortness of breath with daily activities and feel confident in carrying out an exercise regimen at home. Through exercise at rehab and home, the patient will decrease shortness of breath with daily activities and feel confident in carrying out an exercise regimen at home. Through exercise at rehab and home, the patient will decrease shortness of breath with daily activities and feel confident in carrying out an exercise regimen at home. Through exercise at rehab and home, the patient will decrease shortness of breath with daily activities and feel confident in carrying out an exercise regimen  at home.       Discharge Exercise Prescription (Final Exercise Prescription Changes):  Exercise Prescription Changes - 01/17/24 1500       Response to Exercise   Blood Pressure (Admit) 134/82    Blood Pressure (Exercise) 138/82    Blood Pressure (Exit) 132/70    Heart Rate (Admit) 97 bpm    Heart Rate (Exercise) 104 bpm    Heart Rate (Exit)  97 bpm    Oxygen  Saturation (Admit) 97 %    Oxygen  Saturation (Exercise) 94 %    Oxygen  Saturation (Exit) 94 %    Rating of Perceived Exertion (Exercise) 17    Perceived Dyspnea (Exercise) 3    Duration Continue with 30 min of aerobic exercise without signs/symptoms of physical distress.    Intensity THRR unchanged      Progression   Progression Continue to progress workloads to maintain intensity without signs/symptoms of physical distress.      Resistance Training   Training Prescription Yes    Weight red bands    Reps 10-15    Time 10 Minutes      Recumbant Bike   Level 2    Minutes 15    METs 1.9      NuStep   Level 2    SPM 93    Minutes 15    METs 2          Nutrition:  Target Goals: Understanding of nutrition guidelines, daily intake of sodium 1500mg , cholesterol 200mg , calories 30% from fat and 7% or less from saturated fats, daily to have 5 or more servings of fruits and vegetables.  Biometrics:  Pre Biometrics - 11/07/23 1522       Pre Biometrics   Grip Strength 13 kg           Nutrition Therapy Plan and Nutrition Goals:   Nutrition Assessments:  MEDIFICTS Score Key: >=70 Need to make dietary changes  40-70 Heart Healthy Diet <= 40 Therapeutic Level Cholesterol Diet   Picture Your Plate Scores: <59 Unhealthy dietary pattern with much room for improvement. 41-50 Dietary pattern unlikely to meet recommendations for good health and room for improvement. 51-60 More healthful dietary pattern, with some room for improvement.  >60 Healthy dietary pattern, although there may be some specific behaviors that could be improved.    Nutrition Goals Re-Evaluation:   Nutrition Goals Discharge (Final Nutrition Goals Re-Evaluation):   Psychosocial: Target Goals: Acknowledge presence or absence of significant depression and/or stress, maximize coping skills, provide positive support system. Participant is able to verbalize types and ability to use  techniques and skills needed for reducing stress and depression.  Initial Review & Psychosocial Screening:  Initial Psych Review & Screening - 11/07/23 1346       Initial Review   Current issues with None Identified      Family Dynamics   Good Support System? Yes      Barriers   Psychosocial barriers to participate in program There are no identifiable barriers or psychosocial needs.      Screening Interventions   Interventions Encouraged to exercise    Expected Outcomes Short Term goal: Utilizing psychosocial counselor, staff and physician to assist with identification of specific Stressors or current issues interfering with healing process. Setting desired goal for each stressor or current issue identified.;Long Term Goal: Stressors or current issues are controlled or eliminated.;Short Term goal: Identification and review with participant of any Quality of Life or Depression concerns found by scoring the questionnaire.;Long  Term goal: The participant improves quality of Life and PHQ9 Scores as seen by post scores and/or verbalization of changes          Quality of Life Scores:  Scores of 19 and below usually indicate a poorer quality of life in these areas.  A difference of  2-3 points is a clinically meaningful difference.  A difference of 2-3 points in the total score of the Quality of Life Index has been associated with significant improvement in overall quality of life, self-image, physical symptoms, and general health in studies assessing change in quality of life.  PHQ-9: Review Flowsheet  More data exists      11/07/2023 03/29/2023 10/19/2022 08/30/2022 08/25/2022  Depression screen PHQ 2/9  Decreased Interest 1 2 2 3 1   Down, Depressed, Hopeless 0 1 1 1 1   PHQ - 2 Score 1 3 3 4 2   Altered sleeping 1 2 3 2 1   Tired, decreased energy 1 2 3 3 1   Change in appetite 0 2 2 1  0  Feeling bad or failure about yourself  0 0 0 0 1  Trouble concentrating 0 1 1 1  0  Moving slowly or  fidgety/restless 0 0 0 0 1  Suicidal thoughts 0 0 0 0 0  PHQ-9 Score 3  10  12  11  6    Difficult doing work/chores Somewhat difficult Somewhat difficult Somewhat difficult Somewhat difficult Somewhat difficult    Details       Data saved with a previous flowsheet row definition        Interpretation of Total Score  Total Score Depression Severity:  1-4 = Minimal depression, 5-9 = Mild depression, 10-14 = Moderate depression, 15-19 = Moderately severe depression, 20-27 = Severe depression   Psychosocial Evaluation and Intervention:  Psychosocial Evaluation - 11/07/23 1348       Psychosocial Evaluation & Interventions   Interventions Encouraged to exercise with the program and follow exercise prescription    Comments Pt denies any psy/soc barriers. Stated that recently her freezer stopped working and she is upset to have lost her frozen food. Stated she is working on getting it fixed. Pt stated she has great support from her daughters and eldest granddaughter. Denies any additional needs or resources at this time.    Expected Outcomes For Jannat to participate in PR free of any psy/soc barriers or conerns. To get her freezer fixed    Continue Psychosocial Services  No Follow up required          Psychosocial Re-Evaluation:  Psychosocial Re-Evaluation     Row Name 11/09/23 9070 11/28/23 1222 01/02/24 0946 01/25/24 1431       Psychosocial Re-Evaluation   Current issues with None Identified None Identified None Identified None Identified    Comments Psychosocial monthly re-evaluation is as follows: No changes since orientation. Keviana is scheduled to start the program next week. She declined any referrals or additional resources at that time. Monthly psychosocial re-evaluation as follows: Pt denies any psy/soc barriers. Gerline got her freezer fixed. She declined needing any resources to replace the food that had spoiled. Pt was recently hospitalized for a blood clot. She is now taking a  blood thinner. This did not stop Catina. She has come back to class and has been exercising to increase her strength and stamina. She stated she has great support from her daughters and eldest granddaughter. Marcelina denies any additional needs or resources at this time. Monthly psychosocial re-evaluation as follows: Pt denies  any psy/soc barriers. She states she has great support from her daughters and eldest granddaughter. Kathlean denies any additional needs or resources at this time. Monthly psychosocial re-evaluation as follows: Denetta has missed the last week of class due to increased leg pain and swelling. She has been to her MD and ER for this patient but they have been unable to find a diagnosis or solution. While following up with Ayana over the phone, she got emotional about the situation. She denied any additional assistance. She states she has great support from her daughters and eldest granddaughter and hopes to return to class next week.    Expected Outcomes For Adalyn to participate in PR free of any psy/soc barriers or conerns. For Roseanna to participate in PR free of any psy/soc barriers or conerns. For Avabella to participate in PR free of any psy/soc barriers or conerns. For Ketura to participate in PR free of any psy/soc barriers or conerns.    Interventions Encouraged to attend Pulmonary Rehabilitation for the exercise Encouraged to attend Pulmonary Rehabilitation for the exercise Encouraged to attend Pulmonary Rehabilitation for the exercise Encouraged to attend Pulmonary Rehabilitation for the exercise    Continue Psychosocial Services  No Follow up required No Follow up required No Follow up required Follow up required by staff       Psychosocial Discharge (Final Psychosocial Re-Evaluation):  Psychosocial Re-Evaluation - 01/25/24 1431       Psychosocial Re-Evaluation   Current issues with None Identified    Comments Monthly psychosocial re-evaluation as follows: Carlton has missed the last week of class due to  increased leg pain and swelling. She has been to her MD and ER for this patient but they have been unable to find a diagnosis or solution. While following up with Livvy over the phone, she got emotional about the situation. She denied any additional assistance. She states she has great support from her daughters and eldest granddaughter and hopes to return to class next week.    Expected Outcomes For Emunah to participate in PR free of any psy/soc barriers or conerns.    Interventions Encouraged to attend Pulmonary Rehabilitation for the exercise    Continue Psychosocial Services  Follow up required by staff          Education: Education Goals: Education classes will be provided on a weekly basis, covering required topics. Participant will state understanding/return demonstration of topics presented.  Learning Barriers/Preferences:  Learning Barriers/Preferences - 11/07/23 1348       Learning Barriers/Preferences   Learning Barriers None    Learning Preferences None          Education Topics: Know Your Numbers Group instruction that is supported by a PowerPoint presentation. Instructor discusses importance of knowing and understanding resting, exercise, and post-exercise oxygen  saturation, heart rate, and blood pressure. Oxygen  saturation, heart rate, blood pressure, rating of perceived exertion, and dyspnea are reviewed along with a normal range for these values.    Exercise for the Pulmonary Patient Group instruction that is supported by a PowerPoint presentation. Instructor discusses benefits of exercise, core components of exercise, frequency, duration, and intensity of an exercise routine, importance of utilizing pulse oximetry during exercise, safety while exercising, and options of places to exercise outside of rehab.    MET Level  Group instruction provided by PowerPoint, verbal discussion, and written material to support subject matter. Instructor reviews what METs are and how  to increase METs.  Flowsheet Row PULMONARY REHAB CHRONIC OBSTRUCTIVE PULMONARY DISEASE from  12/15/2023 in [facility]  Date 12/15/23  Educator EP  Instruction Review Code 1- Verbalizes Understanding    Pulmonary Medications Verbally interactive group education provided by instructor with focus on inhaled medications and proper administration.   Anatomy and Physiology of the Respiratory System Group instruction provided by PowerPoint, verbal discussion, and written material to support subject matter. Instructor reviews respiratory cycle and anatomical components of the respiratory system and their functions. Instructor also reviews differences in obstructive and restrictive respiratory diseases with examples of each.  Flowsheet Row PULMONARY REHAB CHRONIC OBSTRUCTIVE PULMONARY DISEASE from 12/29/2023 in [facility], [facility], [facility]  Date 12/29/23  Educator RT  Instruction Review Code 1- Verbalizes Understanding    Oxygen  Safety Group instruction provided by PowerPoint, verbal discussion, and written material to support subject matter. There is an overview of What is Oxygen  and Why do we need it.  Instructor also reviews how to create a safe environment for oxygen  use, the importance of using oxygen  as prescribed, and the risks of noncompliance. There is a brief discussion on traveling with oxygen  and resources the patient may utilize.   Oxygen  Use Group instruction provided by PowerPoint, verbal discussion, and written material to discuss how supplemental oxygen  is prescribed and different types of oxygen  supply systems. Resources for more information are provided.    Breathing Techniques Group instruction that is supported by demonstration and informational handouts. Instructor discusses the benefits of pursed lip and diaphragmatic breathing and detailed demonstration on how to perform  both.  Flowsheet Row PULMONARY REHAB CHRONIC OBSTRUCTIVE PULMONARY DISEASE from 11/17/2023 in [facility] for Heart, Vascular, & Lung Health  Date 11/17/23  Educator RN  Instruction Review Code 1- Verbalizes Understanding     Risk Factor Reduction Group instruction that is supported by a PowerPoint presentation. Instructor discusses the definition of a risk factor, different risk factors for pulmonary disease, and how the heart and lungs work together.   Pulmonary Diseases Group instruction provided by PowerPoint, verbal discussion, and written material to support subject matter. Instructor gives an overview of the different type of pulmonary diseases. There is also a discussion on risk factors and symptoms as well as ways to manage the diseases. Flowsheet Row PULMONARY REHAB CHRONIC OBSTRUCTIVE PULMONARY DISEASE from 12/22/2023 in [facility] for Heart, Vascular, & Lung Health  Date 12/22/23  Educator RT  Instruction Review Code 1- Verbalizes Understanding    Stress and Energy Conservation Group instruction provided by PowerPoint, verbal discussion, and written material to support subject matter. Instructor gives an overview of stress and the impact it can have on the body. Instructor also reviews ways to reduce stress. There is also a discussion on energy conservation and ways to conserve energy throughout the day. Flowsheet Row PULMONARY REHAB CHRONIC OBSTRUCTIVE PULMONARY DISEASE from 11/24/2023 in [facility] for Heart, Vascular, & Lung Health  Date 11/24/23  Educator RN  Instruction Review Code 1- Verbalizes Understanding    Warning Signs and Symptoms Group instruction provided by PowerPoint, verbal discussion, and written material to support subject matter. Instructor reviews warning signs and symptoms of stroke, heart attack, cold and flu. Instructor also reviews ways to prevent the spread of  infection.   Other Education Group or individual verbal, written, or video instructions that support the educational goals of the pulmonary rehab program.    Knowledge Questionnaire Score:  Knowledge Questionnaire  Score - 11/07/23 1458       Knowledge Questionnaire Score   Pre Score 17/18          Core Components/Risk Factors/Patient Goals at Admission:  Personal Goals and Risk Factors at Admission - 11/07/23 1525       Core Components/Risk Factors/Patient Goals on Admission   Improve shortness of breath with ADL's Yes    Intervention Provide education, individualized exercise plan and daily activity instruction to help decrease symptoms of SOB with activities of daily living.    Expected Outcomes Short Term: Improve cardiorespiratory fitness to achieve a reduction of symptoms when performing ADLs;Long Term: Be able to perform more ADLs without symptoms or delay the onset of symptoms          Core Components/Risk Factors/Patient Goals Review:   Goals and Risk Factor Review     Row Name 11/09/23 0931 11/28/23 1224 01/02/24 0947 01/25/24 1434       Core Components/Risk Factors/Patient Goals Review   Personal Goals Review Improve shortness of breath with ADL's;Develop more efficient breathing techniques such as purse lipped breathing and diaphragmatic breathing and practicing self-pacing with activity. Improve shortness of breath with ADL's;Develop more efficient breathing techniques such as purse lipped breathing and diaphragmatic breathing and practicing self-pacing with activity. Improve shortness of breath with ADL's;Develop more efficient breathing techniques such as purse lipped breathing and diaphragmatic breathing and practicing self-pacing with activity. Improve shortness of breath with ADL's    Review Monthly review of patient's Core Components/Risk Factors/Patient Goals are as follows: Unable to assess goals yet. Mistina is scheduled to start the program next week. Monthly  review of patient's Core Components/Risk Factors/Patient Goals are as follows: Goal progressing for improving shortness of breath with ADL's. Maloree is currently exercising on room air with exertion to maintain sats >88%. She has completed 2 sessions so far and is exercising and the seated bike. Goal progressing for developing more efficient breathing techniques such as purse lipped breathing and diaphragmatic breathing; and practicing self-pacing with activity. Nishita is working on purse lipped breathing while short of breath. She needs to be prompted while performing the warmup and while exercising. She has begun to work on diaphragmatic breathing at home. Tresha will continue to benefit from PR for nutrition, education, exercise, and lifestyle modification. Monthly review of patient's Core Components/Risk Factors/Patient Goals are as follows: Goal progressing for improving shortness of breath with ADL's. Jhana is currently exercising on room air with exertion to maintain sats >88%. She has completed 10 sessions and is exercising on the NuStep and the seated bike. She has been able to increase her speed, workload, and METs. Goal met for developing more efficient breathing techniques such as purse lipped breathing and diaphragmatic breathing; and practicing self-pacing with activity. Luva is able to perform purse lipped breathing while short of breath independently. She demonstrates this while performing the warmup and while exercising. She works on diaphragmatic breathing at home. Jaylei will continue to benefit from PR for nutrition, education, exercise, and lifestyle modification. Monthly review of patient's Core Components/Risk Factors/Patient Goals are as follows: Liliana has not attended class the last week due to increased leg pain and swelling. Prior to last week her goal for improving shortness of breath with ADL's is progressing. Kayton is exercising on room air with exertion to maintain sats >88%. She has completed 12  sessions and is exercising on the NuStep and the seated bike. She has been able to increase her speed, workload, and METs. Claretta stated  she is hopeful to return to class next week. Mahima will continue to benefit from PR for nutrition, education, exercise, and lifestyle modification.    Expected Outcomes Pt will show progress toward meeting expected goals and outcomes. Pt will show progress toward meeting expected goals and outcomes. Pt will show progress toward meeting expected goals and outcomes. Pt will show progress toward meeting expected goals and outcomes.       Core Components/Risk Factors/Patient Goals at Discharge (Final Review):   Goals and Risk Factor Review - 01/25/24 1434       Core Components/Risk Factors/Patient Goals Review   Personal Goals Review Improve shortness of breath with ADL's    Review Monthly review of patient's Core Components/Risk Factors/Patient Goals are as follows: Iriana has not attended class the last week due to increased leg pain and swelling. Prior to last week her goal for improving shortness of breath with ADL's is progressing. Makayle is exercising on room air with exertion to maintain sats >88%. She has completed 12 sessions and is exercising on the NuStep and the seated bike. She has been able to increase her speed, workload, and METs. Hillari stated she is hopeful to return to class next week. Simran will continue to benefit from PR for nutrition, education, exercise, and lifestyle modification.    Expected Outcomes Pt will show progress toward meeting expected goals and outcomes.          ITP Comments:   Comments: Pt is making expected progress toward Pulmonary Rehab goals after completing 12 session(s). Recommend continued exercise, life style modification, education, and utilization of breathing techniques to increase stamina and strength, while also decreasing shortness of breath with exertion.  Dr. Slater Staff is Medical Director for Pulmonary Rehab at [facility].       [1]  Current Outpatient Medications:    acetaminophen  (TYLENOL ) 325 MG tablet, Take 650 mg by mouth every 6 (six) hours as needed for moderate pain (pain score 4-6) or headache., Disp: , Rfl:    albuterol  (PROVENTIL  HFA;VENTOLIN  HFA) 108 (90 BASE) MCG/ACT inhaler, Inhale 2 puffs into the lungs every 6 (six) hours as needed. For shortness of breath., Disp: 1 Inhaler, Rfl: 0   albuterol  (PROVENTIL ) (2.5 MG/3ML) 0.083% nebulizer solution, Take 3 mLs (2.5 mg total) by nebulization every 6 (six) hours as needed for wheezing or shortness of breath., Disp: 360 mL, Rfl: 5   apixaban  (ELIQUIS ) 5 MG TABS tablet, Take 1 tablet (5 mg total) by mouth 2 (two) times daily., Disp: 180 tablet, Rfl: 3   Blood Glucose Monitoring Suppl (ACCU-CHEK GUIDE) w/Device KIT, 1 kit by Does not apply route as directed., Disp: 1 kit, Rfl: 0   calcium  carbonate (TUMS - DOSED IN MG ELEMENTAL CALCIUM ) 500 MG chewable tablet, Chew 1 tablet by mouth daily as needed for indigestion or heartburn., Disp: , Rfl:    Cholecalciferol  (VITAMIN D ) 50 MCG (2000 UT) tablet, Take 4,000 Units by mouth daily., Disp: , Rfl:    Continuous Glucose Sensor (FREESTYLE LIBRE 3 PLUS SENSOR) MISC, 1 Device by Other route every 14 (fourteen) days. Change sensor every 15 days., Disp: 6 each, Rfl: 3   Cyanocobalamin  (VITAMIN B-12 IJ), Inject 1,000 mcg into the muscle every 30 (thirty) days. (Patient not taking: Reported on 12/21/2023), Disp: , Rfl:    divalproex  (DEPAKOTE  ER) 250 MG 24 hr tablet, TAKE 1 TABLET (250 MG TOTAL) BY MOUTH AT BEDTIME. (Patient not taking: Reported on 12/21/2023), Disp: 90 tablet, Rfl: 1  ezetimibe  (ZETIA ) 10 MG tablet, Take 1 tablet (10 mg total) by mouth daily., Disp: 90 tablet, Rfl: 3   fluticasone  (FLONASE ) 50 MCG/ACT nasal spray, SPRAY 2 SPRAYS INTO EACH NOSTRIL EVERY DAY, Disp: 48 mL, Rfl: 3   Fluticasone -Umeclidin-Vilant (TRELEGY ELLIPTA ) 200-62.5-25 MCG/ACT AEPB, Inhale 1 puff into the lungs daily at 6 (six)  AM., Disp: 60 each, Rfl: 5   furosemide  (LASIX ) 20 MG tablet, Take 1 tablet (20 mg total) by mouth daily. Take 40 mg for the next 3 days then take 20 mg once a day., Disp: 93 tablet, Rfl: 3   HYDROcodone -acetaminophen  (NORCO/VICODIN) 5-325 MG tablet, Take 2 tablets by mouth every 4 (four) hours as needed., Disp: 12 tablet, Rfl: 0   HYDROmorphone  (DILAUDID ) 2 MG tablet, Take 0.5 tablets (1 mg total) by mouth every 4 (four) hours as needed for severe pain (pain score 7-10)., Disp: 30 tablet, Rfl: 0   insulin  lispro protamine-lispro (HUMALOG  75/25 MIX) (75-25) 100 UNIT/ML SUSP injection, Inject 0.06 mLs (6 Units total) into the skin daily with breakfast AND 0.08 mLs (8 Units total) daily with supper. 10 units at lunch depending on bs reading., Disp: 15 mL, Rfl: 3   Insulin  Syringe-Needle U-100 (INSULIN  SYRINGE .3CC/29GX1) 29G X 1 0.3 ML MISC, 1 Device by Does not apply route in the morning and at bedtime., Disp: 200 each, Rfl: 3   isosorbide  mononitrate (IMDUR ) 60 MG 24 hr tablet, Take one tablet ( 60 mg ) twice daily 8 hours apart. (Patient not taking: Reported on 11/10/2023), Disp: 180 tablet, Rfl: 3   meclizine  (ANTIVERT ) 25 MG tablet, Take 25 mg by mouth 2 (two) times daily as needed for dizziness., Disp: , Rfl:    meloxicam (MOBIC) 15 MG tablet, Take 15 mg by mouth daily as needed for pain., Disp: , Rfl:    metoprolol  succinate (TOPROL -XL) 100 MG 24 hr tablet, TAKE 1 TABLET BY MOUTH DAILY. TAKE WITH OR IMMEDIATELY FOLLOWING A MEAL., Disp: 90 tablet, Rfl: 3   montelukast  (SINGULAIR ) 10 MG tablet, Take 1 tablet (10 mg total) by mouth at bedtime., Disp: 30 tablet, Rfl: 11   Multiple Vitamin (MULTIVITAMIN WITH MINERALS) TABS, Take 1 tablet by mouth daily., Disp: , Rfl:    nitroGLYCERIN  (NITROSTAT ) 0.4 MG SL tablet, Place 1 tablet (0.4 mg total) under the tongue every 5 (five) minutes as needed for chest pain., Disp: 25 tablet, Rfl: 3   ondansetron  (ZOFRAN ) 4 MG tablet, TAKE 1 TABLET BY MOUTH EVERY 8  HOURS AS NEEDED FOR NAUSEA AND VOMITING (Patient not taking: Reported on 12/21/2023), Disp: 20 tablet, Rfl: 1   pantoprazole  (PROTONIX ) 40 MG tablet, TAKE 1 TABLET BY MOUTH EVERY DAY (Patient not taking: Reported on 12/21/2023), Disp: 90 tablet, Rfl: 2   predniSONE  (DELTASONE ) 10 MG tablet, Take 4 tablets (40 mg total) by mouth daily., Disp: 16 tablet, Rfl: 0   sertraline  (ZOLOFT ) 100 MG tablet, TAKE 1 TABLET BY MOUTH EVERY DAY, Disp: 90 tablet, Rfl: 2   spironolactone  (ALDACTONE ) 25 MG tablet, TAKE 1 TABLET (25 MG TOTAL) BY MOUTH DAILY. (Patient not taking: Reported on 12/21/2023), Disp: 90 tablet, Rfl: 1   tiZANidine  (ZANAFLEX ) 2 MG tablet, TAKE 1 TO 2 TABLETS (2-4 MG TOTAL) BY MOUTH AT BEDTIME AS NEEDED FOR MUSCLE SPASM, Disp: 180 tablet, Rfl: 0   Turmeric (QC TUMERIC COMPLEX PO), Take 1 capsule by mouth daily., Disp: , Rfl:   Current Facility-Administered Medications:    Study - ORION 4 - inclisiran 300 mg/1.50mL or placebo  SQ injection (PI-Stuckey), 300 mg, Subcutaneous, Q6 months, , 300 mg at 12/21/23 1137 [2]  Social History Tobacco Use  Smoking Status Former   Current packs/day: 0.00   Average packs/day: 0.5 packs/day for 21.0 years (10.5 ttl pk-yrs)   Types: Cigarettes   Start date: 02/09/1955   Quit date: 02/09/1976   Years since quitting: 48.0  Smokeless Tobacco Never

## 2024-02-06 ENCOUNTER — Ambulatory Visit: Admitting: Physical Therapy

## 2024-02-07 ENCOUNTER — Ambulatory Visit (INDEPENDENT_AMBULATORY_CARE_PROVIDER_SITE_OTHER)

## 2024-02-07 ENCOUNTER — Encounter (HOSPITAL_COMMUNITY): Admission: RE | Admit: 2024-02-07 | Source: Ambulatory Visit

## 2024-02-07 VITALS — BP 140/80 | HR 82 | Ht 62.0 in | Wt 167.4 lb

## 2024-02-07 DIAGNOSIS — Z0000 Encounter for general adult medical examination without abnormal findings: Secondary | ICD-10-CM | POA: Diagnosis not present

## 2024-02-07 NOTE — Patient Instructions (Signed)
 [name],  Thank you for taking the time for your Medicare Wellness Visit. I appreciate your continued commitment to your health goals. Please review the care plan we discussed, and feel free to reach out if I can assist you further.  Please note that Annual Wellness Visits do not include a physical exam. Some assessments may be limited, especially if the visit was conducted virtually. If needed, we may recommend an in-person follow-up with your provider.  Ongoing Care Seeing your primary care provider every 3 to 6 months helps us  monitor your health and provide consistent, personalized care.   Referrals If a referral was made during today's visit and you haven't received any updates within two weeks, please contact the referred provider directly to check on the status.  Recommended Screenings:  Health Maintenance  Topic Date Due   DTaP/Tdap/Td vaccine (1 - Tdap) Never done   Zoster (Shingles) Vaccine (1 of 2) Never done   Osteoporosis screening with Bone Density Scan  Never done   Yearly kidney health urinalysis for diabetes  10/29/2020   Eye exam for diabetics  08/23/2023   Flu Shot  09/09/2023   COVID-19 Vaccine (4 - 2025-26 season) 10/10/2023   Complete foot exam   02/15/2024   Hemoglobin A1C  03/11/2024   Yearly kidney function blood test for diabetes  01/11/2025   Medicare Annual Wellness Visit  02/06/2025   Pneumococcal Vaccine for age over 65  Completed   Meningitis B Vaccine  Aged Out       02/07/2024    2:56 PM  Advanced Directives  Does Patient Have a Medical Advance Directive? Yes  Type of Estate Agent of [place];Living will  Does patient want to make changes to medical advance directive? Yes (Inpatient - patient requests chaplain consult to change a medical advance directive)  Copy of Healthcare Power of Attorney in Chart? No - copy requested    Vision: Annual vision screenings are recommended for early detection of glaucoma, cataracts,  and diabetic retinopathy. These exams can also reveal signs of chronic conditions such as diabetes and high blood pressure.  Dental: Annual dental screenings help detect early signs of oral cancer, gum disease, and other conditions linked to overall health, including heart disease and diabetes.

## 2024-02-07 NOTE — Progress Notes (Signed)
 "  Chief Complaint  Patient presents with   Medicare Wellness     Subjective:  Please attest and cosign this visit due to patients primary care provider not being in the office at the time the visit was completed.  (Pt of Dr Glade Hope)   [name] is a 83 y.o. female who presents for a Medicare Annual Wellness Visit.  Visit info / Clinical Intake: Medicare Wellness Visit Type:: Subsequent Annual Wellness Visit Persons participating in visit and providing information:: patient Medicare Wellness Visit Mode:: In-person (required for WTM) Interpreter Needed?: No Pre-visit prep was completed: yes AWV questionnaire completed by patient prior to visit?: no Living arrangements:: (!) lives alone Patient's Overall Health Status Rating: (!) fair Typical amount of pain: none Does pain affect daily life?: no Are you currently prescribed opioids?: no  Dietary Habits and Nutritional Risks How many meals a day?: 3 Eats fruit and vegetables daily?: yes Most meals are obtained by: preparing own meals; eating out In the last 2 weeks, have you had any of the following?: none Diabetic:: (!) yes Any non-healing wounds?: no How often do you check your BS?: 4; as needed (fasting - 260) Would you like to be referred to a Nutritionist or for Diabetic Management? : no  Functional Status Activities of Daily Living (to include ambulation/medication): Independent Ambulation: Independent with device- listed below Home Assistive Devices/Equipment: Cane; Eyeglasses; Nebulizers; Dentures (specify type) Medication Administration: Independent Home Management (perform basic housework or laundry): Independent Manage your own finances?: yes Primary transportation is: driving Concerns about vision?: no *vision screening is required for WTM* Concerns about hearing?: no  Fall Screening Falls in the past year?: 1 Number of falls in past year: 1 (2) Was there an injury with Fall?: 0 Fall Risk Category  Calculator: 2 Patient Fall Risk Level: Moderate Fall Risk  Fall Risk Patient at Risk for Falls Due to: Impaired balance/gait Fall risk Follow up: Falls evaluation completed; Falls prevention discussed  Home and Transportation Safety: All rugs have non-skid backing?: N/A, no rugs All stairs or steps have railings?: N/A, no stairs Grab bars in the bathtub or shower?: yes Have non-skid surface in bathtub or shower?: yes Good home lighting?: yes Regular seat belt use?: yes Hospital stays in the last year:: no  Cognitive Assessment Difficulty concentrating, remembering, or making decisions? : no Will 6CIT or Mini Cog be Completed: yes What year is it?: 0 points What month is it?: 0 points Give patient an address phrase to remember (5 components): [address] [place], [place] About what time is it?: 0 points Count backwards from 20 to 1: 0 points Say the months of the year in reverse: 0 points Repeat the address phrase from earlier: 0 points 6 CIT Score: 0 points  Advance Directives (For Healthcare) Does Patient Have a Medical Advance Directive?: Yes Does patient want to make changes to medical advance directive?: Yes (Inpatient - patient requests chaplain consult to change a medical advance directive) Type of Advance Directive: Healthcare Power of [place]; Living will Copy of Healthcare Power of Attorney in Chart?: No - copy requested Copy of Living Will in Chart?: No - copy requested Would patient like information on creating a medical advance directive?: No - Patient declined  Reviewed/Updated  Reviewed/Updated: Reviewed All (Medical, Surgical, Family, Medications, Allergies, Care Teams, Patient Goals)    Allergies (verified) Amlodipine, Banana, Co q10 [coenzyme q10], Codeine, Insulin  aspart (human analog) (yeast), Lisinopril , Metformin  and related, Morphine, Pentazocine, Pravastatin , Repatha [evolocumab], Statins, Sulfa antibiotics,  Sulfonamide derivatives, and Tramadol  hcl    Current Medications (verified) Outpatient Encounter Medications as of 02/07/2024  Medication Sig   acetaminophen  (TYLENOL ) 325 MG tablet Take 650 mg by mouth every 6 (six) hours as needed for moderate pain (pain score 4-6) or headache.   albuterol  (PROVENTIL  HFA;VENTOLIN  HFA) 108 (90 BASE) MCG/ACT inhaler Inhale 2 puffs into the lungs every 6 (six) hours as needed. For shortness of breath.   albuterol  (PROVENTIL ) (2.5 MG/3ML) 0.083% nebulizer solution Take 3 mLs (2.5 mg total) by nebulization every 6 (six) hours as needed for wheezing or shortness of breath.   apixaban  (ELIQUIS ) 5 MG TABS tablet Take 1 tablet (5 mg total) by mouth 2 (two) times daily.   Blood Glucose Monitoring Suppl (ACCU-CHEK GUIDE) w/Device KIT 1 kit by Does not apply route as directed.   calcium  carbonate (TUMS - DOSED IN MG ELEMENTAL CALCIUM ) 500 MG chewable tablet Chew 1 tablet by mouth daily as needed for indigestion or heartburn.   Cholecalciferol  (VITAMIN D ) 50 MCG (2000 UT) tablet Take 4,000 Units by mouth daily.   Continuous Glucose Sensor (FREESTYLE LIBRE 3 PLUS SENSOR) MISC 1 Device by Other route every 14 (fourteen) days. Change sensor every 15 days.   Cyanocobalamin  (VITAMIN B-12 IJ) Inject 1,000 mcg into the muscle every 30 (thirty) days.   ezetimibe  (ZETIA ) 10 MG tablet Take 1 tablet (10 mg total) by mouth daily.   fluticasone  (FLONASE ) 50 MCG/ACT nasal spray SPRAY 2 SPRAYS INTO EACH NOSTRIL EVERY DAY   Fluticasone -Umeclidin-Vilant (TRELEGY ELLIPTA ) 200-62.5-25 MCG/ACT AEPB Inhale 1 puff into the lungs daily at 6 (six) AM.   furosemide  (LASIX ) 20 MG tablet Take 1 tablet (20 mg total) by mouth daily. Take 40 mg for the next 3 days then take 20 mg once a day.   insulin  lispro protamine-lispro (HUMALOG  75/25 MIX) (75-25) 100 UNIT/ML SUSP injection Inject 0.06 mLs (6 Units total) into the skin daily with breakfast AND 0.08 mLs (8 Units total) daily with supper. 10 units at lunch depending on bs reading.   Insulin   Syringe-Needle U-100 (INSULIN  SYRINGE .3CC/29GX1) 29G X 1 0.3 ML MISC 1 Device by Does not apply route in the morning and at bedtime.   isosorbide  mononitrate (IMDUR ) 60 MG 24 hr tablet Take one tablet ( 60 mg ) twice daily 8 hours apart.   meclizine  (ANTIVERT ) 25 MG tablet Take 25 mg by mouth 2 (two) times daily as needed for dizziness.   meloxicam (MOBIC) 15 MG tablet Take 15 mg by mouth daily as needed for pain.   metoprolol  succinate (TOPROL -XL) 100 MG 24 hr tablet TAKE 1 TABLET BY MOUTH DAILY. TAKE WITH OR IMMEDIATELY FOLLOWING A MEAL.   montelukast  (SINGULAIR ) 10 MG tablet Take 1 tablet (10 mg total) by mouth at bedtime.   Multiple Vitamin (MULTIVITAMIN WITH MINERALS) TABS Take 1 tablet by mouth daily.   nitroGLYCERIN  (NITROSTAT ) 0.4 MG SL tablet Place 1 tablet (0.4 mg total) under the tongue every 5 (five) minutes as needed for chest pain.   pantoprazole  (PROTONIX ) 40 MG tablet TAKE 1 TABLET BY MOUTH EVERY DAY   sertraline  (ZOLOFT ) 100 MG tablet TAKE 1 TABLET BY MOUTH EVERY DAY   spironolactone  (ALDACTONE ) 25 MG tablet TAKE 1 TABLET (25 MG TOTAL) BY MOUTH DAILY.   tiZANidine  (ZANAFLEX ) 2 MG tablet TAKE 1 TO 2 TABLETS (2-4 MG TOTAL) BY MOUTH AT BEDTIME AS NEEDED FOR MUSCLE SPASM   Turmeric (QC TUMERIC COMPLEX PO) Take 1 capsule by mouth daily.   divalproex  (  DEPAKOTE  ER) 250 MG 24 hr tablet TAKE 1 TABLET (250 MG TOTAL) BY MOUTH AT BEDTIME. (Patient not taking: Reported on 02/07/2024)   HYDROcodone -acetaminophen  (NORCO/VICODIN) 5-325 MG tablet Take 2 tablets by mouth every 4 (four) hours as needed. (Patient not taking: Reported on 02/07/2024)   HYDROmorphone  (DILAUDID ) 2 MG tablet Take 0.5 tablets (1 mg total) by mouth every 4 (four) hours as needed for severe pain (pain score 7-10). (Patient not taking: Reported on 02/07/2024)   ondansetron  (ZOFRAN ) 4 MG tablet TAKE 1 TABLET BY MOUTH EVERY 8 HOURS AS NEEDED FOR NAUSEA AND VOMITING (Patient not taking: Reported on 02/07/2024)   predniSONE   (DELTASONE ) 10 MG tablet Take 4 tablets (40 mg total) by mouth daily. (Patient not taking: Reported on 02/07/2024)   Facility-Administered Encounter Medications as of 02/07/2024  Medication   Study - ORION 4 - inclisiran 300 mg/1.62mL or placebo SQ injection (PI-Stuckey)    History: Past Medical History:  Diagnosis Date   Adenomatous colon polyp    Allergy     Anxiety    Arthritis    Asthma    Carotid artery disease    carotid US  02/2017: bilat ICA 1-39%   Chronic diastolic CHF    Echo 02/2017: EF 65-70, Gr 2 DD, mild MS (mean 5), PASP 44   COPD    pt is unsure if has been officially diagnosed   Coronary artery disease    CABG '09- cathed 12/09, 9/10, 6/11, 3/14 and 12/13/16- medical Rx // cath 12/2016 - 2/3 grafts patent >> med Rx // Myoview  12/17: low risk   Diabetes mellitus    Dyspnea    with exertion   Gastroesophageal reflux disease    Headache    Hiatal hernia    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   Hyperlipidemia    Hypertension    Myocardial infarction (HCC)    PONV (postoperative nausea and vomiting)    Schatzki's ring    Shoulder injury    resolved after shoulder surgery   Sleep apnea    not on cpap   Past Surgical History:  Procedure Laterality Date   ABDOMINAL HYSTERECTOMY     APPENDECTOMY     came out with Hysterectomy   CARDIAC CATHETERIZATION  07/23/2009   EF 60%   CARDIAC CATHETERIZATION  10/11/2008   CARDIAC CATHETERIZATION  03/01/2007   EF 75-80%   CARDIOVASCULAR STRESS TEST  11/15/2007   EF 60%   COLONOSCOPY     CORONARY ARTERY BYPASS GRAFT     SEVERELY DISEASED SAPHENOUS VEIN GRAFT TO THE RIGHT CORONARY ARTERY BUT WITH FAIRLY WELL PRESERVED FLOW TO THE DISTAL RIGHT CORONARY ARTERY FROM THE NATIVE CIRCULATION-RESTART  CATH IN JUNE 2000, REVEALS MILD/MODERATE  CAD WITH GOOD FLOW DOWN HER LAD   ESOPHAGOGASTRODUODENOSCOPY     EYE SURGERY     bilateral cataract surgery with lens implant   LEFT HEART CATH AND CORS/GRAFTS ANGIOGRAPHY N/A  12/14/2016   Procedure: LEFT HEART CATH AND CORS/GRAFTS ANGIOGRAPHY;  Surgeon: Dann Candyce RAMAN, MD;  Location: [facility] INVASIVE CV [facility];  Service: Cardiovascular;  Laterality: N/A;   lense removal Left    POLYPECTOMY     REVERSE SHOULDER ARTHROPLASTY Left 06/25/2022   Procedure: REVERSE SHOULDER ARTHROPLASTY;  Surgeon: Kay Kemps, MD;  Location: [facility];  Service: Orthopedics;  Laterality: Left;  general and choice with interscalene block   RIGHT/LEFT HEART CATH AND CORONARY/GRAFT ANGIOGRAPHY N/A 07/28/2021   Procedure: RIGHT/LEFT HEART CATH AND CORONARY/GRAFT ANGIOGRAPHY;  Surgeon: Burnard Debby LABOR, MD;  Location: [facility] CV [facility];  Service: Cardiovascular;  Laterality: N/A;   RIGHT/LEFT HEART CATH AND CORONARY/GRAFT ANGIOGRAPHY N/A 11/11/2023   Procedure: RIGHT/LEFT HEART CATH AND CORONARY/GRAFT ANGIOGRAPHY;  Surgeon: Wonda Sharper, MD;  Location: [facility] INVASIVE CV LAB;  Service: Cardiovascular;  Laterality: N/A;   ROTATOR CUFF REPAIR     right and left   TONSILLECTOMY     age 69   TOTAL KNEE ARTHROPLASTY Right 02/20/2014   Procedure: RIGHT TOTAL KNEE ARTHROPLASTY;  Surgeon: Tanda LABOR Heading, MD;  Location: [facility];  Service: Orthopedics;  Laterality: Right;   tumor removed kidney     UPPER GASTROINTESTINAL ENDOSCOPY     US  ECHOCARDIOGRAPHY  03/08/2008   EF 55-60%   Family History  Problem Relation Age of Onset   Heart disease Maternal Grandfather    Heart failure Maternal Grandfather    Diabetes Maternal Grandfather    Heart attack Father    Diabetes Mother    Rheum arthritis Sister    Emphysema Paternal Uncle    Esophageal cancer Brother 37       she said he was born with it   Emphysema Paternal Aunt    Healthy Child    Neuropathy Neg Hx    Multiple sclerosis Neg Hx    Colon cancer Neg Hx    Colon polyps Neg Hx    Rectal cancer Neg Hx    Stomach cancer Neg Hx    Social History   Occupational History   Occupation: Retired  Tobacco Use   Smoking status: Former    Current  packs/day: 0.00    Average packs/day: 0.5 packs/day for 21.0 years (10.5 ttl pk-yrs)    Types: Cigarettes    Start date: 02/09/1955    Quit date: 02/09/1976    Years since quitting: 48.0   Smokeless tobacco: Never  Vaping Use   Vaping status: Never Used  Substance and Sexual Activity   Alcohol use: No    Alcohol/week: 0.0 standard drinks of alcohol   Drug use: No   Sexual activity: Not Currently   Tobacco Counseling Counseling given: Yes  SDOH Screenings   Food Insecurity: No Food Insecurity (02/07/2024)  Housing: Unknown (02/07/2024)  Transportation Needs: No Transportation Needs (02/07/2024)  Utilities: Not At Risk (02/07/2024)  Alcohol Screen: Low Risk (10/06/2021)  Depression (PHQ2-9): Low Risk (02/07/2024)  Financial Resource Strain: Low Risk (10/06/2021)  Physical Activity: Inactive (02/07/2024)  Social Connections: Moderately Integrated (02/07/2024)  Stress: No Stress Concern Present (02/07/2024)  Tobacco Use: Medium Risk (02/07/2024)  Health Literacy: Adequate Health Literacy (02/07/2024)   See flowsheets for full screening details  Depression Screen PHQ 2 & 9 Depression Scale- Over the past 2 weeks, how often have you been bothered by any of the following problems? Little interest or pleasure in doing things: 0 Feeling down, depressed, or hopeless (PHQ Adolescent also includes...irritable): 0 PHQ-2 Total Score: 0 Trouble falling or staying asleep, or sleeping too much: 0 Feeling tired or having little energy: 0 Poor appetite or overeating (PHQ Adolescent also includes...weight loss): 0 Feeling bad about yourself - or that you are a failure or have let yourself or your family down: 0 Trouble concentrating on things, such as reading the newspaper or watching television (PHQ Adolescent also includes...like school work): 0 Moving or speaking so slowly that other people could have noticed. Or the opposite - being so fidgety or restless that you have been moving around a lot  more than usual: 0  Thoughts that you would be better off dead, or of hurting yourself in some way: 0 PHQ-9 Total Score: 0 If you checked off any problems, how difficult have these problems made it for you to do your work, take care of things at home, or get along with other people?: Not difficult at all  Depression Treatment Depression Interventions/Treatment : EYV7-0 Score <4 Follow-up Not Indicated; Medication; Currently on Treatment     Goals Addressed               This Visit's Progress     Patient Stated (pt-stated)        Patient stated she plans to continue walking as much as possible             Objective:    Today's Vitals   02/07/24 1455 02/07/24 1531  BP: (!) 160/82 (!) 140/80  Pulse: 82   SpO2: 98%   Weight: 167 lb 6.4 oz (75.9 kg)   Height: 5' 2 (1.575 m)    Body mass index is 30.62 kg/m.  Hearing/Vision screen Hearing Screening - Comments:: Denies hearing difficulties   Vision Screening - Comments:: Wears rx glasses - up to date with routine eye exams with Adine Haddock Immunizations and Health Maintenance Health Maintenance  Topic Date Due   DTaP/Tdap/Td (1 - Tdap) Never done   Zoster Vaccines- Shingrix (1 of 2) Never done   Bone Density Scan  Never done   Diabetic kidney evaluation - Urine ACR  10/29/2020   Influenza Vaccine  09/09/2023   COVID-19 Vaccine (4 - 2025-26 season) 10/10/2023   FOOT EXAM  02/15/2024   HEMOGLOBIN A1C  03/11/2024   OPHTHALMOLOGY EXAM  12/18/2024   Diabetic kidney evaluation - eGFR measurement  01/11/2025   Medicare Annual Wellness (AWV)  02/06/2025   Pneumococcal Vaccine: 50+ Years  Completed   Meningococcal B Vaccine  Aged Out        Assessment/Plan:  This is a routine wellness examination for [name].  I have recommended that this patient have a immunization for Influenza, Shingles, and Tdap and DEXA Scan but she declines at this time. I have discussed the risks and benefits of this procedure with her. The  patient verbalizes understanding.   Patient Care Team: Geofm Glade PARAS, MD as PCP - General (Internal Medicine) Ren Ny, Joelle DEL, MD as PCP - Cardiology (Cardiology) Brien Belvie BRAVO, MD as Attending Physician (Pulmonary Disease) Aneita Gwendlyn DASEN, MD (Inactive) as Consulting Physician (Gastroenterology) Noreen Tonnie BRAVO, MD as Consulting Physician (Pulmonary Disease) Skeet Juliene SAUNDERS, DO as Consulting Physician (Neurology) Lelon Glendia DASEN DEVONNA as Physician Assistant (Cardiology) Szabat, Toribio BROCKS, [facility] (Inactive) as Pharmacist (Pharmacist) Haddock Adine, MD as Consulting Physician (Ophthalmology)  I have personally reviewed and noted the following in the patients chart:   Medical and social history Use of alcohol, tobacco or illicit drugs  Current medications and supplements including opioid prescriptions. Functional ability and status Nutritional status Physical activity Advanced directives List of other physicians Hospitalizations, surgeries, and ER visits in previous 12 months Vitals Screenings to include cognitive, depression, and falls Referrals and appointments  No orders of the defined types were placed in this encounter.  In addition, I have reviewed and discussed with patient certain preventive protocols, quality metrics, and best practice recommendations. A written personalized care plan for preventive services as well as general preventive health recommendations were provided to patient.   [name], CMA   02/07/2024   Return in 1 year (on 02/06/2025).  After Visit Summary: (In Person-Declined) Patient declined AVS at this time.  Nurse Notes: scheduled a CPE appt w/PCP for 05/2024 "

## 2024-02-08 ENCOUNTER — Telehealth (HOSPITAL_COMMUNITY): Payer: Self-pay | Admitting: *Deleted

## 2024-02-08 NOTE — Telephone Encounter (Signed)
 Notified pt that PR will be closed 1/1. She is also awaiting her MRI but plans to be in class 1/6.  Aliene Aris BS, ACSM-CEP 02/08/2024 11:25 AM

## 2024-02-10 ENCOUNTER — Telehealth: Payer: Self-pay

## 2024-02-10 NOTE — Telephone Encounter (Signed)
 MyChart message sent to patient regarding Otolaryngology referral placed by Dr. Geofm

## 2024-02-14 ENCOUNTER — Inpatient Hospital Stay (HOSPITAL_COMMUNITY): Admission: RE | Admit: 2024-02-14 | Source: Ambulatory Visit

## 2024-02-15 ENCOUNTER — Telehealth (HOSPITAL_COMMUNITY): Payer: Self-pay | Admitting: *Deleted

## 2024-02-15 NOTE — Addendum Note (Signed)
 Encounter addended by: Midge Cloyd POUR on: 02/15/2024 12:14 PM  Actions taken: Flowsheet data copied forward, Flowsheet accepted

## 2024-02-15 NOTE — Telephone Encounter (Signed)
 Pt returned call. 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 She might need surgery. Will discharge pt from Pulmonary Rehab. Encouraged her to get another referral in the future if she can come back. Aliene Aris BS, ACSM-CEP 02/15/2024 11:13 AM

## 2024-02-16 ENCOUNTER — Encounter (HOSPITAL_COMMUNITY)

## 2024-02-16 ENCOUNTER — Other Ambulatory Visit: Payer: Self-pay | Admitting: Internal Medicine

## 2024-02-16 MED ORDER — TIZANIDINE HCL 2 MG PO TABS: ORAL_TABLET | ORAL | 0 refills | Status: AC

## 2024-02-16 NOTE — Telephone Encounter (Unsigned)
 Copied from CRM [id number]. Topic: Clinical - Medication Refill >> Feb 16, 2024 11:15 AM Tinnie C wrote: Medication: tiZANidine  (ZANAFLEX ) 2 MG tablet Pt is out of this medication. Requesting refill for as soon as possible.  Has the patient contacted their pharmacy? Yes No refills left.  This is the patient's preferred pharmacy:  [id number] [place], [place] - [address] AT [facility] [address] [address] [place] [address] Phone: [phone] Fax: [phone]  Is this the correct pharmacy for this prescription? Yes If no, delete pharmacy and type the correct one.   Has the prescription been filled recently? Yes  Is the patient out of the medication? Yes  Has the patient been seen for an appointment in the last year OR does the patient have an upcoming appointment? Yes, apt for 4/27  Can we respond through MyChart? No, please call [phone]  Agent: Please be advised that Rx refills may take up to 3 business days. We ask that you follow-up with your pharmacy.

## 2024-02-17 NOTE — Addendum Note (Signed)
 Encounter addended by: Claudene Augustin NOVAK, RRT on: 02/17/2024 8:42 AM  Actions taken: Flowsheet accepted, Episode resolved, Clinical Note Signed

## 2024-02-17 NOTE — Progress Notes (Signed)
 Discharge Progress Report  Patient Details  Name: [name] MRN: [id number] Date of Birth: [birth year] Referring Provider:   [facility] Walk Test from 11/07/2023 in [facility]  Referring Provider Dr. Lonna Coder     Number of Visits: 12  Reason for Discharge:  Early Exit:  Personal  Smoking History:  Tobacco Use History[1]  Diagnosis:  Stage 2 moderate COPD by GOLD classification (HCC)  ADL UCSD:  Pulmonary Assessment Scores     Row Name 11/07/23 1345         ADL UCSD   ADL Phase Entry     SOB Score total 88       CAT Score   CAT Score 29       mMRC Score   mMRC Score 3        Initial Exercise Prescription:  Initial Exercise Prescription - 11/07/23 1500       Date of Initial Exercise RX and Referring Provider   Date 11/07/23    Referring Provider Dr. Lonna Coder    Expected Discharge Date 02/14/24      Recumbant Bike   Level 1    Minutes 15    METs 1.2      NuStep   Level 1    SPM 60    Minutes 15    METs 1.4      Prescription Details   Frequency (times per week) 2    Duration Progress to 30 minutes of continuous aerobic without signs/symptoms of physical distress      Intensity   THRR 40-80% of Max Heartrate 54-108    Ratings of Perceived Exertion 11-13    Perceived Dyspnea 0-4      Progression   Progression Continue to progress workloads to maintain intensity without signs/symptoms of physical distress.      Resistance Training   Training Prescription Yes    Weight red bands    Reps 10-15          Discharge Exercise Prescription (Final Exercise Prescription Changes):  Exercise Prescription Changes - 01/17/24 1500       Response to Exercise   Blood Pressure (Admit) 134/82    Blood Pressure (Exercise) 138/82    Blood Pressure (Exit) 132/70    Heart Rate (Admit) 97 bpm    Heart Rate (Exercise) 104 bpm    Heart Rate (Exit) 97 bpm    Oxygen   Saturation (Admit) 97 %    Oxygen  Saturation (Exercise) 94 %    Oxygen  Saturation (Exit) 94 %    Rating of Perceived Exertion (Exercise) 17    Perceived Dyspnea (Exercise) 3    Duration Continue with 30 min of aerobic exercise without signs/symptoms of physical distress.    Intensity THRR unchanged      Progression   Progression Continue to progress workloads to maintain intensity without signs/symptoms of physical distress.      Resistance Training   Training Prescription Yes    Weight red bands    Reps 10-15    Time 10 Minutes      Recumbant Bike   Level 2    Minutes 15    METs 1.9      NuStep   Level 2    SPM 93    Minutes 15    METs 2          Functional Capacity:  6 Minute Walk     Row Name  11/07/23 1517         6 Minute Walk   Phase Initial     Distance 1080 feet     Walk Time 6 minutes     # of Rest Breaks 2  1:52-2:25, 4:51-5:21     MPH 2.05     METS 1.5     RPE 15     Perceived Dyspnea  3     VO2 Peak 5.26     Resting HR 50 bpm     Resting BP 132/64     Resting Oxygen  Saturation  95 %     Exercise Oxygen  Saturation  during 6 min walk 86 %  O2 dropped to 86%, stopped pt and O2 recovered within 10 seconds     Max Ex. HR 92 bpm     Max Ex. BP 170/74     2 Minute Post BP 140/70       Interval HR   1 Minute HR 57     2 Minute HR 78     3 Minute HR 79     4 Minute HR 81     5 Minute HR 92     6 Minute HR 86     2 Minute Post HR 62     Interval Heart Rate? Yes       Interval Oxygen    Interval Oxygen ? Yes     Baseline Oxygen  Saturation % 95 %     1 Minute Oxygen  Saturation % 99 %     1 Minute Liters of Oxygen  0 L     2 Minute Oxygen  Saturation % 94 %     2 Minute Liters of Oxygen  0 L     3 Minute Oxygen  Saturation % 90 %     3 Minute Liters of Oxygen  0 L     4 Minute Oxygen  Saturation % 87 %  stopped pt and O2 recovered w/in 10 seconds     4 Minute Liters of Oxygen  0 L     5 Minute Oxygen  Saturation % 86 %  stopped pt and O2 recovered  w/in 10 seconds     5 Minute Liters of Oxygen  0 L     6 Minute Oxygen  Saturation % 91 %     6 Minute Liters of Oxygen  0 L     2 Minute Post Oxygen  Saturation % 97 %     2 Minute Post Liters of Oxygen  0 L        Psychological, QOL, Others - Outcomes: PHQ 2/9:    02/07/2024    3:23 PM 11/07/2023    3:27 PM 03/29/2023   11:22 AM 10/19/2022    4:02 PM 08/30/2022    1:07 PM  Depression screen PHQ 2/9  Decreased Interest 0 1 2 2 3   Down, Depressed, Hopeless 0 0 1 1 1   PHQ - 2 Score 0 1 3 3 4   Altered sleeping 0 1 2 3 2   Tired, decreased energy 0 1 2 3 3   Change in appetite 0 0 2 2 1   Feeling bad or failure about yourself  0 0 0 0 0  Trouble concentrating 0 0 1 1 1   Moving slowly or fidgety/restless 0 0 0 0 0  Suicidal thoughts 0 0 0 0 0  PHQ-9 Score 0 3  10  12  11    Difficult doing work/chores Not difficult at all Somewhat difficult Somewhat difficult Somewhat difficult Somewhat difficult     Data  saved with a previous flowsheet row definition    Quality of Life:   Personal Goals: Goals established at orientation with interventions provided to work toward goal.  Personal Goals and Risk Factors at Admission - 11/07/23 1525       Core Components/Risk Factors/Patient Goals on Admission   Improve shortness of breath with ADL's Yes    Intervention Provide education, individualized exercise plan and daily activity instruction to help decrease symptoms of SOB with activities of daily living.    Expected Outcomes Short Term: Improve cardiorespiratory fitness to achieve a reduction of symptoms when performing ADLs;Long Term: Be able to perform more ADLs without symptoms or delay the onset of symptoms           Personal Goals Discharge:  Goals and Risk Factor Review     Row Name 11/09/23 0931 11/28/23 1224 01/02/24 0947 01/25/24 1434 02/17/24 0837     Core Components/Risk Factors/Patient Goals Review   Personal Goals Review Improve shortness of breath with ADL's;Develop more  efficient breathing techniques such as purse lipped breathing and diaphragmatic breathing and practicing self-pacing with activity. Improve shortness of breath with ADL's;Develop more efficient breathing techniques such as purse lipped breathing and diaphragmatic breathing and practicing self-pacing with activity. Improve shortness of breath with ADL's;Develop more efficient breathing techniques such as purse lipped breathing and diaphragmatic breathing and practicing self-pacing with activity. Improve shortness of breath with ADL's Improve shortness of breath with ADL's   Review Monthly review of patient's Core Components/Risk Factors/Patient Goals are as follows: Unable to assess goals yet. Clarabell is scheduled to start the program next week. Monthly review of patient's Core Components/Risk Factors/Patient Goals are as follows: Goal progressing for improving shortness of breath with ADL's. Harbor is currently exercising on room air with exertion to maintain sats >88%. She has completed 2 sessions so far and is exercising and the seated bike. Goal progressing for developing more efficient breathing techniques such as purse lipped breathing and diaphragmatic breathing; and practicing self-pacing with activity. Domitila is working on purse lipped breathing while short of breath. She needs to be prompted while performing the warmup and while exercising. She has begun to work on diaphragmatic breathing at home. Cathi will continue to benefit from PR for nutrition, education, exercise, and lifestyle modification. Monthly review of patient's Core Components/Risk Factors/Patient Goals are as follows: Goal progressing for improving shortness of breath with ADL's. Glady is currently exercising on room air with exertion to maintain sats >88%. She has completed 10 sessions and is exercising on the NuStep and the seated bike. She has been able to increase her speed, workload, and METs. Goal met for developing more efficient breathing  techniques such as purse lipped breathing and diaphragmatic breathing; and practicing self-pacing with activity. Antwanette is able to perform purse lipped breathing while short of breath independently. She demonstrates this while performing the warmup and while exercising. She works on diaphragmatic breathing at home. Arena will continue to benefit from PR for nutrition, education, exercise, and lifestyle modification. Monthly review of patient's Core Components/Risk Factors/Patient Goals are as follows: Denyce has not attended class the last week due to increased leg pain and swelling. Prior to last week her goal for improving shortness of breath with ADL's is progressing. Krystl is exercising on room air with exertion to maintain sats >88%. She has completed 12 sessions and is exercising on the NuStep and the seated bike. She has been able to increase her speed, workload, and METs. Annamary stated she is  hopeful to return to class next week. Sanna will continue to benefit from PR for nutrition, education, exercise, and lifestyle modification. Lajuanda was discharged from the PR program on 02/15/23. She only completed 12 sessions. She was working on improving her shortness of breath with ADL's, but unfortunately did not meet that goal. Hopefully Kamoni will be able to restart the program once she gets her back and leg issues resolved.   Expected Outcomes Pt will show progress toward meeting expected goals and outcomes. Pt will show progress toward meeting expected goals and outcomes. Pt will show progress toward meeting expected goals and outcomes. Pt will show progress toward meeting expected goals and outcomes. --      Exercise Goals and Review:  Exercise Goals     Row Name 11/07/23 1343             Exercise Goals   Increase Physical Activity Yes       Intervention Develop an individualized exercise prescription for aerobic and resistive training based on initial evaluation findings, risk stratification, comorbidities and  participant's personal goals.;Provide advice, education, support and counseling about physical activity/exercise needs.       Expected Outcomes Short Term: Attend rehab on a regular basis to increase amount of physical activity.;Long Term: Exercising regularly at least 3-5 days a week.;Long Term: Add in home exercise to make exercise part of routine and to increase amount of physical activity.       Increase Strength and Stamina Yes       Intervention Provide advice, education, support and counseling about physical activity/exercise needs.;Develop an individualized exercise prescription for aerobic and resistive training based on initial evaluation findings, risk stratification, comorbidities and participant's personal goals.       Expected Outcomes Short Term: Increase workloads from initial exercise prescription for resistance, speed, and METs.;Short Term: Perform resistance training exercises routinely during rehab and add in resistance training at home;Long Term: Improve cardiorespiratory fitness, muscular endurance and strength as measured by increased METs and functional capacity ( )       Able to understand and use rate of perceived exertion (RPE) scale Yes       Intervention Provide education and explanation on how to use RPE scale       Expected Outcomes Short Term: Able to use RPE daily in rehab to express subjective intensity level;Long Term:  Able to use RPE to guide intensity level when exercising independently       Able to understand and use Dyspnea scale Yes       Intervention Provide education and explanation on how to use Dyspnea scale       Expected Outcomes Short Term: Able to use Dyspnea scale daily in rehab to express subjective sense of shortness of breath during exertion;Long Term: Able to use Dyspnea scale to guide intensity level when exercising independently       Knowledge and understanding of Target Heart Rate Range (THRR) Yes       Intervention Provide education and  explanation of THRR including how the numbers were predicted and where they are located for reference       Expected Outcomes Short Term: Able to state/look up THRR;Short Term: Able to use daily as guideline for intensity in rehab;Long Term: Able to use THRR to govern intensity when exercising independently       Understanding of Exercise Prescription Yes       Intervention Provide education, explanation, and written materials on patient's individual exercise prescription  Expected Outcomes Short Term: Able to explain program exercise prescription;Long Term: Able to explain home exercise prescription to exercise independently          Exercise Goals Re-Evaluation:  Exercise Goals Re-Evaluation     Row Name 11/07/23 1545 12/01/23 0908 12/29/23 0949 01/27/24 1330 02/15/24 1132     Exercise Goal Re-Evaluation   Exercise Goals Review Increase Physical Activity;Able to understand and use Dyspnea scale;Understanding of Exercise Prescription;Increase Strength and Stamina;Knowledge and understanding of Target Heart Rate Range (THRR);Able to understand and use rate of perceived exertion (RPE) scale Increase Physical Activity;Able to understand and use Dyspnea scale;Understanding of Exercise Prescription;Increase Strength and Stamina;Knowledge and understanding of Target Heart Rate Range (THRR);Able to understand and use rate of perceived exertion (RPE) scale Increase Physical Activity;Able to understand and use Dyspnea scale;Understanding of Exercise Prescription;Increase Strength and Stamina;Knowledge and understanding of Target Heart Rate Range (THRR);Able to understand and use rate of perceived exertion (RPE) scale Increase Physical Activity;Able to understand and use Dyspnea scale;Understanding of Exercise Prescription;Increase Strength and Stamina;Knowledge and understanding of Target Heart Rate Range (THRR);Able to understand and use rate of perceived exertion (RPE) scale Increase Physical  Activity;Able to understand and use Dyspnea scale;Understanding of Exercise Prescription;Increase Strength and Stamina;Knowledge and understanding of Target Heart Rate Range (THRR);Able to understand and use rate of perceived exertion (RPE) scale   Comments Pt to begin exercise next week. Will monitor for progression. Pt has completed 2 exercise sessions, missing 3 for illness. She is exercising on the recumbent stepper for 15 min, level 1, METs 2.3. She then is exercising on the recumbent bike for 15 min, level 1, METs 1.8. She has not had enough time to begin progressing. She performs warm up and cool down standing with support, using red bands, 3.7 lbs. Will monitor for progression. Pt has completed 9 exercise sessions, missing 3 for illness. She is exercising on the recumbent stepper for 15 min, level 2, METs 2.4. She then is exercising on the recumbent bike for 15 min, level 2, METs 2. She is slowly progressing. She performs warm up and cool down standing with support, using red bands, 3.7 lbs. Will monitor for progression. Pt has completed 12 exercise sessions. She has missed 5 of the last 6 sessions due to ongoing left leg pain that is being evaluated.  She has not increased level or METs since the last evaluation. She is exercising on the recumbent stepper for 15 min, level 2, METs 2.4. She then is exercising on the recumbent bike for 15 min, level 2, METs 2. Will progress as able however she might have to terminate PR due to her pain. Pt completed 12 exercise sessions. She has not been able to attend due to leg pain and now has learned that she needs back surgery. Will discharge pt. Her METs were 2-2.4.   Expected Outcomes Through exercise at rehab and home, the patient will decrease shortness of breath with daily activities and feel confident in carrying out an exercise regimen at home. Through exercise at rehab and home, the patient will decrease shortness of breath with daily activities and feel  confident in carrying out an exercise regimen at home. Through exercise at rehab and home, the patient will decrease shortness of breath with daily activities and feel confident in carrying out an exercise regimen at home. Through exercise at rehab and home, the patient will decrease shortness of breath with daily activities and feel confident in carrying out an exercise regimen at home. Through exercise  at rehab and home, the patient will decrease shortness of breath with daily activities and feel confident in carrying out an exercise regimen at home.      Nutrition & Weight - Outcomes:  Pre Biometrics - 11/07/23 1522       Pre Biometrics   Grip Strength 13 kg           Nutrition:   Nutrition Discharge:   Education Questionnaire Score:  Knowledge Questionnaire Score - 11/07/23 1458       Knowledge Questionnaire Score   Pre Score 17/18          Goals reviewed with patient; copy given to patient.    [1]  Social History Tobacco Use  Smoking Status Former   Current packs/day: 0.00   Average packs/day: 0.5 packs/day for 21.0 years (10.5 ttl pk-yrs)   Types: Cigarettes   Start date: 02/09/1955   Quit date: 02/09/1976   Years since quitting: 48.0  Smokeless Tobacco Never

## 2024-02-21 ENCOUNTER — Encounter (HOSPITAL_COMMUNITY)

## 2024-02-23 ENCOUNTER — Encounter (HOSPITAL_COMMUNITY)

## 2024-02-28 ENCOUNTER — Encounter (HOSPITAL_COMMUNITY)

## 2024-03-01 ENCOUNTER — Encounter (HOSPITAL_COMMUNITY)

## 2024-03-05 ENCOUNTER — Ambulatory Visit: Admitting: Internal Medicine

## 2024-03-05 ENCOUNTER — Telehealth: Payer: Self-pay

## 2024-03-05 NOTE — Telephone Encounter (Signed)
 I LDVM for pt to call the clinic in regard to her apptmnt that was for 03/05/2024... Due to the weather the clinic is closed and we are needing to r/s the pt for another date. Please sched pt at next avail apptmnt opening.

## 2024-03-06 ENCOUNTER — Encounter (HOSPITAL_COMMUNITY)

## 2024-03-08 ENCOUNTER — Encounter: Payer: Self-pay | Admitting: Internal Medicine

## 2024-03-09 ENCOUNTER — Ambulatory Visit: Admitting: Internal Medicine

## 2024-03-09 VITALS — BP 126/80 | HR 70 | Temp 98.0°F | Resp 16 | Ht 62.0 in | Wt 167.0 lb

## 2024-03-09 DIAGNOSIS — E7800 Pure hypercholesterolemia, unspecified: Secondary | ICD-10-CM | POA: Diagnosis not present

## 2024-03-09 DIAGNOSIS — R55 Syncope and collapse: Secondary | ICD-10-CM

## 2024-03-09 DIAGNOSIS — E1059 Type 1 diabetes mellitus with other circulatory complications: Secondary | ICD-10-CM | POA: Diagnosis not present

## 2024-03-09 DIAGNOSIS — I5032 Chronic diastolic (congestive) heart failure: Secondary | ICD-10-CM

## 2024-03-09 DIAGNOSIS — M5416 Radiculopathy, lumbar region: Secondary | ICD-10-CM | POA: Insufficient documentation

## 2024-03-09 DIAGNOSIS — N281 Cyst of kidney, acquired: Secondary | ICD-10-CM | POA: Diagnosis not present

## 2024-03-09 DIAGNOSIS — F419 Anxiety disorder, unspecified: Secondary | ICD-10-CM | POA: Diagnosis not present

## 2024-03-09 NOTE — Assessment & Plan Note (Signed)
 Experiencing left thigh pain which has been severe Gabapentin  is helping-continue 200 mg in the morning, 100 mg in the afternoon and 100 mg in evening Has an appointment with Dr. Brendon injections will help

## 2024-03-09 NOTE — Assessment & Plan Note (Signed)
 Chronic Had a recent MRI that showed cysts in one of her kidneys Reviewed CT scan she had done in 2023 which showed bilateral renal simple cysts Reassured her-no further evaluation is necessary

## 2024-03-09 NOTE — Patient Instructions (Addendum)
" ° ° ° ° °  Follow up with cardiology.      Medications changes include :   None     "

## 2024-03-09 NOTE — Assessment & Plan Note (Addendum)
 Chronic Not ideally controlled - she deals with it She thinks she focuses too much on some of her chronic medical problems Continue sertraline  to 100 mg daily She does not want to adjust medications which I agree with Discussed other things that she can do to help control some of her anxiety

## 2024-03-09 NOTE — Assessment & Plan Note (Signed)
 Chronic Regular exercise and healthy diet encouraged Currently in a study-Orion every 6 months, Zetia  10 mg daily

## 2024-03-09 NOTE — Assessment & Plan Note (Signed)
 Has had several episodes of syncope without prodromal symptoms Has not discussed with cardiology-stressed that she needs to call them to make an appointment to rule out cardiac cause

## 2024-03-15 NOTE — Progress Notes (Unsigned)
" °   °  °  Cardiology Office Note Date:  03/15/2024  ID:  [name], DOB Jan 12, 1941, MRN [id number] PCP:  Geofm Glade PARAS, MD  Cardiologist:  Joelle VEAR [name], MD  No chief complaint on file.     Problems Dyspnea LHC 10/25: stable CAD with patent native vessels and bypass grafts RHC 10/25: Mod pHTN with PVR 4; RA 3 PA 52/14(31), TPG 24 RLE DVT 10/25 on Eliquis  CAD s/p CABG x 3 (LIMA-LAD, SVG-RCA, SVG-Diag) 2009 LHC 07/2021- prox LAD 60%, prox RCA 25%, LIMA/SVG to D1 patent, SVG-PDA occluded TTE 04/2023- 60-65%, mild LVH, GI1DD, severe MAC Event Monitor 02/2023 for 7 days normal HFpEF ED for volume overload in 02/2023 OSA not on CPAP, severe COPD Statin intolerance Uncontrolled IDDM HTN/HLD M: AN5, EE10, FE20, Insulin , ISMN60, XL100, SE25  Visits  03/25: ordered TTE for dyspnea --> 60-65%, G1DD, severe MAC and referred to pulm who started neb and recommended pulm rehab; Per pulm, dyspnea likely due to severe COPD-asthma and not being on CPAP.  09/25: LHC/RHC, DVT US , LP, start ezetimibe , d/c diltiazem  and started new nebulizer at the end of month 02/26: did pulm rehab, GLP1, Bempedoic acid      ROS: Otherwise negative Discussed the use of AI scribe software for clinical note transcription with the patient, who gave verbal consent to proceed.  History of Present Illness     Physical Exam VS:  LMP  (LMP Unknown)  , BMI There is no height or weight on file to calculate BMI. GEN: Well nourished, well developed, in no acute distress HEENT: normal Neck: no JVD, carotid bruits, or masses Cardiac: ***RRR; no murmurs, rubs, or gallops,no edema  Respiratory:  CTAB bilaterally, normal work of breathing GI: soft, nontender, nondistended, + BS Extremities: No LE edema Skin: warm and dry, no rash Neuro:  Strength and sensation are intact  Recent Labs: Reviewed  Assessment & Plan      Signed, Joelle VEAR [name], MD  03/15/2024 3:30 PM    [facility] "

## 2024-03-29 ENCOUNTER — Ambulatory Visit

## 2024-03-29 DIAGNOSIS — E669 Obesity, unspecified: Secondary | ICD-10-CM

## 2024-03-29 DIAGNOSIS — E119 Type 2 diabetes mellitus without complications: Secondary | ICD-10-CM

## 2024-03-29 DIAGNOSIS — Z951 Presence of aortocoronary bypass graft: Secondary | ICD-10-CM

## 2024-03-29 DIAGNOSIS — I10 Essential (primary) hypertension: Secondary | ICD-10-CM

## 2024-03-29 DIAGNOSIS — E7800 Pure hypercholesterolemia, unspecified: Secondary | ICD-10-CM

## 2024-03-29 DIAGNOSIS — J4489 Other specified chronic obstructive pulmonary disease: Secondary | ICD-10-CM

## 2024-03-29 DIAGNOSIS — Z789 Other specified health status: Secondary | ICD-10-CM

## 2024-03-29 DIAGNOSIS — I5032 Chronic diastolic (congestive) heart failure: Secondary | ICD-10-CM

## 2024-06-04 ENCOUNTER — Encounter: Admitting: Internal Medicine

## 2024-06-20 ENCOUNTER — Encounter

## 2025-02-12 ENCOUNTER — Ambulatory Visit
